# Patient Record
Sex: Male | Born: 1945 | Race: White | Hispanic: No | Marital: Married | Smoking: Former smoker
Health system: Southern US, Community
[De-identification: ages and names within clinical notes are randomized; demographics above are authoritative.]

## PROBLEM LIST (undated history)

## (undated) DIAGNOSIS — IMO0002 Reserved for concepts with insufficient information to code with codable children: Secondary | ICD-10-CM

## (undated) DIAGNOSIS — N529 Male erectile dysfunction, unspecified: Secondary | ICD-10-CM

## (undated) DIAGNOSIS — J189 Pneumonia, unspecified organism: Secondary | ICD-10-CM

## (undated) DIAGNOSIS — B019 Varicella without complication: Secondary | ICD-10-CM

## (undated) DIAGNOSIS — Z0000 Encounter for general adult medical examination without abnormal findings: Secondary | ICD-10-CM

## (undated) DIAGNOSIS — R5383 Other fatigue: Secondary | ICD-10-CM

## (undated) DIAGNOSIS — R030 Elevated blood-pressure reading, without diagnosis of hypertension: Secondary | ICD-10-CM

## (undated) DIAGNOSIS — G4700 Insomnia, unspecified: Secondary | ICD-10-CM

## (undated) DIAGNOSIS — F329 Major depressive disorder, single episode, unspecified: Secondary | ICD-10-CM

## (undated) DIAGNOSIS — E663 Overweight: Secondary | ICD-10-CM

## (undated) DIAGNOSIS — M5137 Other intervertebral disc degeneration, lumbosacral region: Secondary | ICD-10-CM

## (undated) DIAGNOSIS — I10 Essential (primary) hypertension: Secondary | ICD-10-CM

## (undated) DIAGNOSIS — K219 Gastro-esophageal reflux disease without esophagitis: Secondary | ICD-10-CM

## (undated) DIAGNOSIS — J029 Acute pharyngitis, unspecified: Secondary | ICD-10-CM

## (undated) DIAGNOSIS — N400 Enlarged prostate without lower urinary tract symptoms: Secondary | ICD-10-CM

## (undated) DIAGNOSIS — E785 Hyperlipidemia, unspecified: Secondary | ICD-10-CM

## (undated) DIAGNOSIS — M51379 Other intervertebral disc degeneration, lumbosacral region without mention of lumbar back pain or lower extremity pain: Secondary | ICD-10-CM

## (undated) DIAGNOSIS — Z973 Presence of spectacles and contact lenses: Secondary | ICD-10-CM

## (undated) DIAGNOSIS — IMO0001 Reserved for inherently not codable concepts without codable children: Secondary | ICD-10-CM

## (undated) DIAGNOSIS — M545 Low back pain: Secondary | ICD-10-CM

## (undated) DIAGNOSIS — T7840XA Allergy, unspecified, initial encounter: Secondary | ICD-10-CM

## (undated) DIAGNOSIS — R739 Hyperglycemia, unspecified: Secondary | ICD-10-CM

## (undated) DIAGNOSIS — R0600 Dyspnea, unspecified: Secondary | ICD-10-CM

## (undated) DIAGNOSIS — Z86010 Personal history of colonic polyps: Secondary | ICD-10-CM

## (undated) DIAGNOSIS — E349 Endocrine disorder, unspecified: Secondary | ICD-10-CM

## (undated) DIAGNOSIS — F419 Anxiety disorder, unspecified: Secondary | ICD-10-CM

## (undated) HISTORY — DX: Varicella without complication: B01.9

## (undated) HISTORY — DX: Other intervertebral disc degeneration, lumbosacral region: M51.37

## (undated) HISTORY — DX: Encounter for general adult medical examination without abnormal findings: Z00.00

## (undated) HISTORY — DX: Elevated blood-pressure reading, without diagnosis of hypertension: R03.0

## (undated) HISTORY — DX: Hyperglycemia, unspecified: R73.9

## (undated) HISTORY — DX: Anxiety disorder, unspecified: F41.9

## (undated) HISTORY — DX: Personal history of colonic polyps: Z86.010

## (undated) HISTORY — DX: Low back pain: M54.5

## (undated) HISTORY — DX: Reserved for concepts with insufficient information to code with codable children: IMO0002

## (undated) HISTORY — PX: TONSILLECTOMY: SUR1361

## (undated) HISTORY — PX: EYE SURGERY: SHX253

## (undated) HISTORY — DX: Male erectile dysfunction, unspecified: N52.9

## (undated) HISTORY — DX: Major depressive disorder, single episode, unspecified: F32.9

## (undated) HISTORY — DX: Endocrine disorder, unspecified: E34.9

## (undated) HISTORY — PX: BACK SURGERY: SHX140

## (undated) HISTORY — DX: Allergy, unspecified, initial encounter: T78.40XA

## (undated) HISTORY — DX: Benign prostatic hyperplasia without lower urinary tract symptoms: N40.0

## (undated) HISTORY — DX: Other fatigue: R53.83

## (undated) HISTORY — DX: Acute pharyngitis, unspecified: J02.9

## (undated) HISTORY — DX: Insomnia, unspecified: G47.00

## (undated) HISTORY — PX: TONSILLECTOMY: SHX5217

## (undated) HISTORY — DX: Overweight: E66.3

## (undated) HISTORY — DX: Reserved for inherently not codable concepts without codable children: IMO0001

## (undated) HISTORY — DX: Essential (primary) hypertension: I10

## (undated) HISTORY — DX: Other intervertebral disc degeneration, lumbosacral region without mention of lumbar back pain or lower extremity pain: M51.379

## (undated) HISTORY — DX: Gastro-esophageal reflux disease without esophagitis: K21.9

## (undated) HISTORY — DX: Hyperlipidemia, unspecified: E78.5

---

## 1998-06-16 ENCOUNTER — Ambulatory Visit (HOSPITAL_COMMUNITY): Admission: RE | Admit: 1998-06-16 | Discharge: 1998-06-16 | Payer: Self-pay | Admitting: *Deleted

## 1998-06-16 ENCOUNTER — Encounter: Payer: Self-pay | Admitting: *Deleted

## 1998-06-30 ENCOUNTER — Encounter: Payer: Self-pay | Admitting: *Deleted

## 1998-06-30 ENCOUNTER — Ambulatory Visit (HOSPITAL_COMMUNITY): Admission: RE | Admit: 1998-06-30 | Discharge: 1998-06-30 | Payer: Self-pay | Admitting: *Deleted

## 1998-07-22 ENCOUNTER — Encounter: Payer: Self-pay | Admitting: *Deleted

## 1998-07-22 ENCOUNTER — Ambulatory Visit (HOSPITAL_COMMUNITY): Admission: RE | Admit: 1998-07-22 | Discharge: 1998-07-22 | Payer: Self-pay | Admitting: *Deleted

## 1998-08-20 ENCOUNTER — Ambulatory Visit (HOSPITAL_COMMUNITY): Admission: RE | Admit: 1998-08-20 | Discharge: 1998-08-20 | Payer: Self-pay | Admitting: *Deleted

## 1998-08-20 ENCOUNTER — Encounter: Payer: Self-pay | Admitting: *Deleted

## 2000-07-18 LAB — HM COLONOSCOPY

## 2000-07-28 ENCOUNTER — Ambulatory Visit (HOSPITAL_COMMUNITY): Admission: RE | Admit: 2000-07-28 | Discharge: 2000-07-28 | Payer: Self-pay | Admitting: *Deleted

## 2000-07-28 ENCOUNTER — Encounter: Payer: Self-pay | Admitting: *Deleted

## 2000-08-14 ENCOUNTER — Ambulatory Visit (HOSPITAL_COMMUNITY): Admission: RE | Admit: 2000-08-14 | Discharge: 2000-08-14 | Payer: Self-pay | Admitting: *Deleted

## 2000-08-14 ENCOUNTER — Encounter: Payer: Self-pay | Admitting: *Deleted

## 2000-08-25 ENCOUNTER — Encounter: Payer: Self-pay | Admitting: *Deleted

## 2000-08-25 ENCOUNTER — Ambulatory Visit (HOSPITAL_COMMUNITY): Admission: RE | Admit: 2000-08-25 | Discharge: 2000-08-25 | Payer: Self-pay | Admitting: *Deleted

## 2002-09-13 ENCOUNTER — Ambulatory Visit (HOSPITAL_COMMUNITY): Admission: RE | Admit: 2002-09-13 | Discharge: 2002-09-13 | Payer: Self-pay | Admitting: *Deleted

## 2004-08-24 ENCOUNTER — Encounter: Admission: RE | Admit: 2004-08-24 | Discharge: 2004-11-22 | Payer: Self-pay | Admitting: Family Medicine

## 2004-09-27 ENCOUNTER — Encounter: Admission: RE | Admit: 2004-09-27 | Discharge: 2004-09-27 | Payer: Self-pay | Admitting: Otolaryngology

## 2007-01-29 ENCOUNTER — Observation Stay (HOSPITAL_COMMUNITY): Admission: RE | Admit: 2007-01-29 | Discharge: 2007-01-29 | Payer: Self-pay | Admitting: Internal Medicine

## 2007-01-29 ENCOUNTER — Encounter: Payer: Self-pay | Admitting: Internal Medicine

## 2008-07-18 HISTORY — PX: OTHER SURGICAL HISTORY: SHX169

## 2010-11-30 NOTE — H&P (Signed)
NAME:  Luis Brown, Luis Brown NO.:  [id number]   MEDICAL RECORD NO.:  [id number]          PATIENT TYPE:  EMS   LOCATION:  [facility]                         FACILITY:  [facility]   PHYSICIAN:  Barnetta Chapel, MDDATE OF BIRTH:  [birth year]   DATE OF ADMISSION:  01/28/2007  DATE OF DISCHARGE:                              HISTORY & PHYSICAL   PRIMARY CARE PHYSICIAN:  Dr. Nicholes Mango with the [facility]   CHIEF COMPLAINT:  Chest discomfort, dizziness.   HISTORY OF PRESENT ILLNESS:  The patient is a 65 year old male with past  medical history significant for anxiety, chronic back pain and  dyslipidemia.  According to the patient he has never had chest pain or  heart attack in the past.  He also denied family history of heart  attack.  The patient presented today with episodes of chest discomfort.  The history sounds quite atypical.  He reports having episodes of what  looks like adrenalin surge.  He reports chest discomfort, dizziness,  light-headedness and panic symptoms.  He denied any recent stresses.  He  denied any shortness of breath.  He has problems with sleep but denied  symptoms of depression.  He feels that life is worth living at the  moment.  No episodic headache.  However, he has sweats.  He has chronic  back and neck pain.  He has been having nausea and some churning  symptoms associated with these episodes.  No vomiting or change in bowel  habits.  No weight loss.  No joint pains.   PAST MEDICAL HISTORY:  Essentially as above.   MEDICATIONS PRIOR TO ADMISSION:  1. Celebrex.  2. Ultram.  3. Lipitor.  4. Sudafed.   ALLERGIES:  None.   SOCIAL HISTORY:  The patient has been married twice.  He is in his  second marriage.  He has three children.  He drinks alcohol but denies  any withdrawal symptoms.  He denied cigarette smoking or illicit drug  use.   FAMILY HISTORY:  Significant for emphysema in the patient's dad.  The  patient's mom may have had  mini-strokes.   REVIEW OF SYSTEMS:  Essentially as above.   PHYSICAL EXAMINATION:  VITAL SIGNS:  Temperature 97.8, blood pressure  ranges from 149-155 over 74-87 mmHg.  Heart rate is 68, respiratory rate  16.  GENERAL:  The patient denies any distress.  HEENT:  Extraocular muscle movements are intact.  LUNGS:  Clear to auscultation.  CVS:  S1 and S2.  No heart murmur appreciated.  ABDOMEN:  Soft and benign.  NEURO:  Exam is nonfocal.  EXTREMITIES:  No edema.   LABORATORY DATA:  Labs done so far include sodium 138, potassium 3.6,  chloride 107, BUN 29, creatinine 1.1, glucose 100.  White blood cell  count 6.8, hemoglobin 14.9, hematocrit 42.8, MCV 93.9 and platelet count  is 303.   IMPRESSION:  1. Suspect acute coronary syndrome.  2. __________ .  3. Suspect anxiety/panic attacks.  4. Dyslipidemia.   PLAN:  Will admit patient for observation.  Will check cardiac enzymes  as per protocol.  Will get more  history from the patient.  Will consider  cardiac workup in the morning (such as possible cardiac stress test).  Will also check fractionated free plasma metanephrines.  Will also check  fasting lipid profile.  Will check D-dimer.  Will measure orthostasis as  the patient seems to get dizzy any time he stands up.  Will also get a  CT scan of the brain and if necessary will proceed with MRI and/or  carotid Doppler ultrasound.  Further management will depend on hospital  course.      Barnetta Chapel, MD  Electronically Signed     SIO/MEDQ  D:  01/28/2007  T:  01/29/2007  Job:  [id number]   cc:   Dr, Nicholes Mango

## 2010-11-30 NOTE — Discharge Summary (Signed)
NAME:  NATHANUEL, CABREJA              ACCOUNT NO.:  [id number]   MEDICAL RECORD NO.:  [id number]          PATIENT TYPE:  EMS   LOCATION:  [facility]                         FACILITY:  [facility]   PHYSICIAN:  Corinna L. Lendell Caprice, MDDATE OF BIRTH:  March 12, 1946   DATE OF ADMISSION:  01/28/2007  DATE OF DISCHARGE:  01/29/2007                               DISCHARGE SUMMARY   DISCHARGE DIAGNOSES:  1. Transient chest pressure, myocardial infarction ruled out.  Will      follow up with [facility] today at 1:45 pm.  2. Frontal headache, suspect sinusitis.  3. Dizziness, suspect dehydration.  4. Possible panic attack versus vasovagal symptoms.  5. Hyperlipidemia, treated.  6. Chronic back and neck pain.   DISCHARGE MEDICATIONS:  Aspirin 81 mg a day.  He is to stop Sudafed for  now.  He may continue Celebrex, Ultram and Lipitor as previous.  Afrin 2  sprays per nostril twice daily for 3 days.   FOLLOWUP:  1. Dr. Armanda Magic today at 1:45 p.m., to evaluate for the need of      stress test or other.  2. Dr. Doran Clay in 2 weeks, to evaluate his symptoms other than      the chest pain.   CONDITION:  Stable.   CONSULTATIONS:  None.   PROCEDURES:  None.   DISCHARGE ACTIVITIES:  He may return to work in 1-2 days.  He should  avoid being outside in the hot sun for several days.   DIET:  Low cholesterol.  Push fluids.   PERTINENT LABORATORIES:  TSH is pending.  Urine metanephrine is pending.  Two sets of troponin is negative.  CPK-MB is slightly elevated at 5.1.  Total CPK 192.  Index 2.7.  Total cholesterol 168, HDL 69, LDL 81.  Inital BMET was significant for a BUN 29, creatinine 1.1.  After IV  fluids his BUN the following day was 22.  Total bilirubin 1.7, otherwise  unremarkable complete metabolic panel.  Urinalysis with negative  ketones, small blood, negative nitrite, negative leukocyte esterase,  negative protein.  EKG showed normal sinus rhythm with nonspecific  changes.  CT of  the sinuses showed right maxillary sinusitis with  mucosal thickening in the left maxillary and right frontal sinuses.  Prior bilateral medial antrostomies and ethmoidectomies.  Two views of  the chest showed nothing active.   HISTORY AND HOSPITAL COURSE:  Mr. Faulk is a pleasant previously very  healthy 65 year old white male patient of Dr. Janey Greaser, who presented to  the emergency room with a constellation of symptoms.  He reports that he  had been working in the hot sun outside and began to feel real dizzy,  sweaty, nauseated, faint.  He thought he might be having a panic attack,  and started breathing into a paper bag.  He also had some brief twinges  of chest pressure, which would last for a few seconds on and off.  He  reports having had a panic attack 20 years ago, but this was somewhat  different.  He has had no recent life stressors.   He was admitted to  telemetry, where he remained in normal sinus rhythm;  and he ruled out for a myocardial infarction.  He had eaten breakfast  already and his chest pain is rather atypical; so, I have arranged for a  follow-up with Dr. Mayford Knife later today to evaluate for need for stress  test.  I have discussed the case with her.  I have also asked the  patient to bring a copy of his hospital records with him to the  appointment.   He was given IV fluids overnight.  His dizziness improved somewhat.  He  was not dehydrated on examination, but his BUN was rather elevated --  suggesting dehydration.  He also had a frontal headache, which felt  somewhat like previous sinusitis in the past.  He has been taking  Sudafed.  I suspect his symptoms may have been a combination of  dehydration, Sudafed, possibly panic attack, and/or vasovagal symptoms.  But, I would recommend an outpatient stress test if Dr. Mayford Knife agrees to  evaluate for heart disease.  He does report that his exercise tolerance  is quite good, and he does not experience chest pressure  with exertion.  His only risk factor is hyperlipidemia, but this is treated with  Lipitor.  Also, urine metanephrines were ordered by the admitting  physician, and I have also asked that a TSH be drawn to evaluate for  hyperthyroidism.      Corinna L. Lendell Caprice, MD  Electronically Signed     CLS/MEDQ  D:  01/29/2007  T:  01/29/2007  Job:  [id number]   cc:   Armanda Magic, M.D.  Fax: [phone]   Al Decant. Janey Greaser, MD  Fax: [phone]

## 2011-04-08 ENCOUNTER — Other Ambulatory Visit (HOSPITAL_COMMUNITY): Payer: Self-pay | Admitting: Specialist

## 2011-04-08 ENCOUNTER — Encounter (HOSPITAL_COMMUNITY)
Admission: RE | Admit: 2011-04-08 | Discharge: 2011-04-08 | Disposition: A | Discharge status: Home / Self Care | Payer: Managed Care, Other (non HMO) | Source: Ambulatory Visit | Attending: Specialist | Admitting: Specialist

## 2011-04-08 DIAGNOSIS — M419 Scoliosis, unspecified: Secondary | ICD-10-CM

## 2011-04-08 DIAGNOSIS — M48061 Spinal stenosis, lumbar region without neurogenic claudication: Secondary | ICD-10-CM

## 2011-04-08 LAB — COMPREHENSIVE METABOLIC PANEL
ALT: 14 U/L (ref 0–53)
AST: 28 U/L (ref 0–37)
Albumin: 3.9 g/dL (ref 3.5–5.2)
Alkaline Phosphatase: 85 U/L (ref 39–117)
BUN: 25 mg/dL — ABNORMAL HIGH (ref 6–23)
CO2: 32 mEq/L (ref 19–32)
Calcium: 9.8 mg/dL (ref 8.4–10.5)
Chloride: 101 mEq/L (ref 96–112)
Creatinine, Ser: 1.07 mg/dL (ref 0.50–1.35)
GFR calc Af Amer: >60 mL/min (ref >60)
GFR calc non Af Amer: >60 mL/min (ref >60)
Glucose, Bld: 108 mg/dL — ABNORMAL HIGH (ref 70–99)
Potassium: 4.6 mEq/L (ref 3.5–5.1)
Sodium: 139 mEq/L (ref 135–145)
Total Bilirubin: 0.8 mg/dL (ref 0.3–1.2)
Total Protein: 6.4 g/dL (ref 6.0–8.3)

## 2011-04-08 LAB — URINE MICROSCOPIC-ADD ON

## 2011-04-08 LAB — URINALYSIS, ROUTINE W REFLEX MICROSCOPIC
Bilirubin Urine: NEGATIVE
Glucose, UA: NEGATIVE mg/dL
Ketones, ur: NEGATIVE mg/dL
Leukocytes, UA: NEGATIVE
Nitrite: NEGATIVE
Protein, ur: NEGATIVE mg/dL
Specific Gravity, Urine: 1.015 (ref 1.005–1.030)
Urobilinogen, UA: 0.2 mg/dL (ref 0.0–1.0)
pH: 7 (ref 5.0–8.0)

## 2011-04-08 LAB — CBC
HCT: 41.8 % (ref 39.0–52.0)
Hemoglobin: 14.7 g/dL (ref 13.0–17.0)
MCH: 33.9 pg (ref 26.0–34.0)
MCHC: 35.2 g/dL (ref 30.0–36.0)
MCV: 96.3 fL (ref 78.0–100.0)
Platelets: 286 10*3/uL (ref 150–400)
RBC: 4.34 MIL/uL (ref 4.22–5.81)
RDW: 12.1 % (ref 11.5–15.5)
WBC: 8 10*3/uL (ref 4.0–10.5)

## 2011-04-08 LAB — TYPE AND SCREEN
ABO/RH(D): A POS
Antibody Screen: NEGATIVE

## 2011-04-08 LAB — PROTIME-INR
INR: 0.91 (ref 0.00–1.49)
Prothrombin Time: 12.4 seconds (ref 11.6–15.2)

## 2011-04-08 LAB — SURGICAL PCR SCREEN
MRSA, PCR: NEGATIVE
Staphylococcus aureus: POSITIVE — AB

## 2011-04-08 LAB — ABO/RH: ABO/RH(D): A POS

## 2011-04-12 ENCOUNTER — Inpatient Hospital Stay (HOSPITAL_COMMUNITY)
Admission: RE | Admit: 2011-04-12 | Discharge: 2011-04-14 | DRG: 460 | Disposition: A | Discharge status: Home / Self Care | Payer: Managed Care, Other (non HMO) | Source: Ambulatory Visit | Attending: Specialist | Admitting: Specialist

## 2011-04-12 ENCOUNTER — Inpatient Hospital Stay (HOSPITAL_COMMUNITY): Payer: Managed Care, Other (non HMO)

## 2011-04-12 DIAGNOSIS — F1010 Alcohol abuse, uncomplicated: Secondary | ICD-10-CM | POA: Diagnosis present

## 2011-04-12 DIAGNOSIS — F411 Generalized anxiety disorder: Secondary | ICD-10-CM | POA: Diagnosis present

## 2011-04-12 DIAGNOSIS — M48061 Spinal stenosis, lumbar region without neurogenic claudication: Principal | ICD-10-CM | POA: Diagnosis present

## 2011-04-12 DIAGNOSIS — Z01812 Encounter for preprocedural laboratory examination: Secondary | ICD-10-CM

## 2011-04-12 DIAGNOSIS — Z87891 Personal history of nicotine dependence: Secondary | ICD-10-CM

## 2011-04-12 DIAGNOSIS — M4120 Other idiopathic scoliosis, site unspecified: Secondary | ICD-10-CM | POA: Diagnosis present

## 2011-04-12 DIAGNOSIS — D62 Acute posthemorrhagic anemia: Secondary | ICD-10-CM | POA: Diagnosis not present

## 2011-04-13 LAB — BASIC METABOLIC PANEL
BUN: 16 mg/dL (ref 6–23)
CO2: 29 mEq/L (ref 19–32)
Calcium: 7.6 mg/dL — ABNORMAL LOW (ref 8.4–10.5)
Chloride: 106 mEq/L (ref 96–112)
Creatinine, Ser: 0.82 mg/dL (ref 0.50–1.35)
GFR calc Af Amer: >60 mL/min (ref >60)
GFR calc non Af Amer: >60 mL/min (ref >60)
Glucose, Bld: 118 mg/dL — ABNORMAL HIGH (ref 70–99)
Potassium: 3.8 mEq/L (ref 3.5–5.1)
Sodium: 139 mEq/L (ref 135–145)

## 2011-04-13 LAB — HEMOGLOBIN AND HEMATOCRIT, BLOOD
HCT: 28.1 % — ABNORMAL LOW (ref 39.0–52.0)
Hemoglobin: 9.4 g/dL — ABNORMAL LOW (ref 13.0–17.0)

## 2011-04-14 LAB — BASIC METABOLIC PANEL
BUN: 13 mg/dL (ref 6–23)
CO2: 29 mEq/L (ref 19–32)
Calcium: 8.2 mg/dL — ABNORMAL LOW (ref 8.4–10.5)
Chloride: 108 mEq/L (ref 96–112)
Creatinine, Ser: 0.75 mg/dL (ref 0.50–1.35)
GFR calc Af Amer: >60 mL/min (ref >60)
GFR calc non Af Amer: >60 mL/min (ref >60)
Glucose, Bld: 151 mg/dL — ABNORMAL HIGH (ref 70–99)
Potassium: 3.9 mEq/L (ref 3.5–5.1)
Sodium: 140 mEq/L (ref 135–145)

## 2011-04-14 LAB — HEMOGLOBIN AND HEMATOCRIT, BLOOD
HCT: 26.3 % — ABNORMAL LOW (ref 39.0–52.0)
Hemoglobin: 9.1 g/dL — ABNORMAL LOW (ref 13.0–17.0)

## 2011-04-19 NOTE — Op Note (Signed)
NAME:  Luis Brown, Luis Brown NO.:  [id number]  MEDICAL RECORD NO.:  [id number]  LOCATION:  5030                         FACILITY:  [facility]  PHYSICIAN:  Kerrin Champagne, M.D.   DATE OF BIRTH:  1945-08-31  DATE OF PROCEDURE:  04/12/2011 DATE OF DISCHARGE:  04/14/2011                              OPERATIVE REPORT   PREOPERATIVE DIAGNOSES: 1. Multilevel lumbar spinal stenosis, severe, central, and biforaminal     L2-L3, L3-L4, and L4-L5. 2. 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 3. Severe right L3-L4 nerve root distribution radicular pain.  POSTOPERATIVE DIAGNOSES: 1. Multilevel lumbar spinal stenosis, severe, central, and biforaminal     L2-L3, L3-L4, and L4-L5. 2. 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 3. Severe right L3-L4 nerve root distribution radicular pain.  PROCEDURES:  Central decompressive laminectomy L2-L3, L3-L4, and L4-L5, right-sided L2-L3 and L3-L4 transforaminal lumbar interbody fusions using a 9-mm cage DePuy parallel at the L2-L3 level with local bone graft.  An 8-mm lordotic DePuy cage at the L3-L4 level with local bone graft.  Posterolateral fusion L2 through L5 three levels with local bone graft.  L2 through L5 segmental instrumentation with DePuy pedicle screws and rods, single cross-link at the L3-L4 level.  Cell Saver was used during this case.  SURGEON:  Kerrin Champagne, MD  ASSISTANT:  Wende Neighbors, PA-C  ANESTHESIA:  Quita Skye. Krista Blue, MD, supplemented with local infiltration with Marcaine 0.5% with 1:200,000 epinephrine, 20 mL at the incision site.  ESTIMATED BLOOD LOSS:  1400 mL.  Cell Saver return blood hyper- hematocrit 575 mL.  DRAINS:  Hemovac medium x1, right lower lumbar.  Foley to straight drain.  COMPLICATIONS:  None.  BRIEF CLINICAL HISTORY:  This patient is a 65 year old male who has had a history of previous lumbar decompression and diskectomy in the 1990s. He has been followed  for sometime with persistent severe back pain and radiation to his buttocks which has been worsening with increasing difficulties with prolonged standing, prolonged ambulation.  He has undergone studies that have shown significant stenosis at both L2-L3, L3- L4 and a lateral recess narrowing at L4-L5 with a collapsing degenerative scoliosis pattern.  He has been treated conservatively with therapy with epidural steroids over the last 3-4 years.  He has reached a point where epidural steroids no longer of much benefit.  His pain is severe, requiring narcotic medications and he is avoiding activities secondary to his pain.  He has difficulty walking any distance.  He has difficulty with normal activities of daily living, difficulty standing from a sitting position.  The patient has been unable to perform his usual work duties secondary to increasing back discomfort and leg pain. He underwent recent MRI scan which demonstrated severe lumbar spinal stenosis L2-L3, L3-L4, moderate stenosis and lateral recess stenosis L4- L5.  He has a collapsing degenerative scoliosis pattern with apex to the left side at the L2-L3 and L3-L4 levels.  There is loss of an anterior column height and relative kyphosis or hyperlordosis across this mid upper lumbar region.  He has undergone extensive attempts at conservative management and has failed these and presents for surgical treatment.  The risks and benefits  of procedure have been discussed with the patient and he has signed informed consent.  DESCRIPTION OF PROCEDURE:  This patient is seen in the preoperative holding area describing primarily right leg pain.  He had marking of his back on the right side with an X on my initials, labeling the levels for which surgery would be done at L2-L3, L3-L4, L4-L5 on the right side. The patient understands that likely we will perform the TLIF at the upper 2 segments L2-L3, L3-L4 and the lowest level which has severe  disk space narrowing will likely be treated with posterolateral fusion alone. He has signed informed consent.  We have answered all questions preoperatively in preop holding area.  He received standard preoperative antibiotics of Ancef, transported to the operating room [facility] via stretcher.  OR room #4 was used for this procedure.  The patient underwent induction of general anesthesia on the stretcher with atraumatic intubation.  He was then transferred to a prone position on the [place] spine frame.  Bolsters leveled appropriately, arms at the side 90/90, the legs well padded with PAS hose to prevent DVT.  A Foley catheter was placed prior to this patient being turned atraumatically as well.  The patient had had previous incision scar.  The lumbar spine was prepped from the mid-to-lower dorsal spine to the mid sacral level with DuraPrep solution.  He was then draped in the usual manner, iodine Vi- Drape was used.  Incision extended from T12 to S1, the ellipse of the old incision scar at the L4-L5, L5-S1 levels.  The incision was through skin and subcu layers directly down to the lumbodorsal fascia in the midline excising the old incision scar over the lower half of the incision.  Lumbodorsal fascia was then incised in the midline over the spinous process of S1, L5, L4, L3, L2, and L1.  Carefully, dissection was carried laterally exposing the lateral aspects of the spinous process of each level.  Cobbs were used to elevate paralumbar muscles and continued dissection and carried laterally.  With the facet capsule preserved at this point, then clamps were placed.  A clamp placed at the level below the L3 spinous process and above the L3 spinous process. These areas were marked for continued identification.  The L2, L3, and L4 levels were then carefully marked for continued identification throughout the case, marking these levels.  Note that C-arm fluoro was draped sterilely,  brought into the field for the lateral radiograph lumbar spine.  Also note that standard time-out protocol was carried out prior to the incision on this patient.  Once the levels were marked, then continued dissection was carried out laterally and the facet capsules were resected at the L4-L5, L3-L4, and L2-L3 levels bilaterally and a transverse process of L5, L4, L3, and L2 were identified. Bleeders controlled using monopolar electrocautery and bipolar electrocautery where necessary.  Cell Saver was used during this case. The patient did have some brisk venous-type bleeding during the dissection and Cell Saver was necessary during this case.  Viper retractor was then placed and the areas over the transverse processes were carefully packed using sponges bilaterally.  Attention was then turned to perform a central laminectomy.  The inferior half of the spinous process of L1; entire spinous process of L2, L3, and L4; superior third of the spinous process of L5 were resected.  This bone was carefully morselized and preserved for later bone grafting purposes. Central portions of the lamina of L2, L3, and L4  were then carefully thinned using Leksell rongeur where possible to save this bone for later bone grafting as well.  Osteotomes were then used to perform osteotomies of the inferior articular process of L4 bilaterally, L3 bilaterally, and L2 bilaterally.  Leksell was also used to remove a small portion of bone over the inferior aspect of the lamina of L1 bilaterally.  At the center, we performed a resection of ligamentum flavum at the L1-L2 level to prevent adjacent level stenosis.  Osteotome was then used to remove bone centrally in order to thin the neural arch.  This was done carefully leaving the ligamentum flavum intact where possible and returning to use of Kerrisons, further resection of the remaining lamina at L4, L3, and L2 were carried out until this was a thin wafer bone  to allow for resection using Kerrisons.  Beginning at the L1-L2 level, then the ligamentum flavum at this segment was resected and foraminotomies performed over both L2 nerve roots.  Lateral recess was decompressed with a small portion of the inferior articular process of L1 and superior articular process of L2 being resected about 10% both sides. Then performing central laminectomy at the L2 level, bone was resected centrally, osteotomized laterally over the pars area of both sides in order to open the central lamina laminectomy.  Ligamentum flavum at the L2-L3 level was then resected using 2 and 3-mm Kerrisons off the medial aspect of the facet at L2-L3 bilaterally and then foraminotomies performed over both L3 nerve roots identifying then the medial border of the pedicles bilaterally at L3 and then osteotomized the medial aspect of the superior articular process of L3 both sides and removing bone here that way decompressing the L2-L3 level without causing significant compression.  This was carried out bilaterally.  The right superior portion of the L3 superior articular process was resected to the superior portion of the pedicle of L3 in order to perform a wide foraminotomy of the right L2 nerve root and this was done without difficulty.  Reflected portions of flavum reflected were resected off the L2 nerve root underlying.  This bleeder was controlled using bipolar electrocautery.  Hockey stick probe could then be passed along the lateral recess at L1-L2 and out the neural foramen on the right-hand side at L2-L3, indicating that the L2 nerve root was well decompressed. Similarly, this was done at the L3-L4 level resecting residual portions of the remaining lamina at the upper aspect of L3 using 2 and 3-mm Kerrison, osteotomizing medial portions of the pars and the inferior articular process bilaterally at the L3-L4 level.  Resecting ligamentum flavum off the medial aspect of the facet  bilaterally at the L3-L4 level and then performing foraminotomy over both L4 nerve roots, identifying the medial aspect of pedicle at the L4 level, then osteotomizing superior articular process of L4 both sides and decompressing the lateral recesses.  The superior portion of the right-side L4 superior articular process was resected in order to decompress the right L3 nerve root at the L3-L4 level.  This was done without difficulty.  The ligamentum flavum reflected was then resected off the L3 nerve root. Nerve root noted to be exiting down without further compression. Similarly then, the L4 lamina was resected and the L4-L5 ligamentum flavum resected.  Medial facetectomy carried out bilaterally after foraminotomy over the L5 nerve roots, widening the central laminectomy in the area of the pars area as well with resection of bone.  A foraminotomy was carried out over both L4  nerve roots and L5 nerve roots, resecting hypertrophic ligamentum flavum that was depressing on the L4 and L5 nerve roots and well decompressing the central portions of the canal at this level.  With this completed, bleeding was controlled using bipolar electrocautery and then thrombin-soaked Gelfoam on the right-sided L2-L3 and at L3-L4 especially within the neural foramen where bleeding a venous type was encountered, obtaining excellent hemostasis.  C-arm fluoro was reestablish and an awl then used to make an entry point in the intersection of the transverse process of L2 with the lateral aspect of the pedicle of L2 observed on C-arm fluoro to be in good position and alignment.  Handheld pedicle probe then used to probe the pedicle of L2 to a depth of nearly 45 mm.  A ball-tipped probe was then used to probe this channel, ensuring patency and no sign of broaching a cortex.  Tapping was performed using a 6-mm tap, decortication carried out at the lateral aspect of superior articular process of L2 and the transverse  process here.  Bone graft that was obtained locally morselized was placed over this decorticated transverse process and the 7-mm x 45-mm screw was then inserted with correct degree of convergence lordosis observed on C-arm fluoro to be in excellent position and alignment.  Turning next to the right side L3 level, similarly an awl was introduced into the lateral aspect of pedicle of L3 observed on C-arm fluoro to be in good position and alignment.  Then, a handheld pedicle probe used to probe pedicle channel at L3 to a depth of 45 mm and a 45-mm screw was chosen x 7 mm.  Tapping with a 6-mm tap. Decortication was then carried out of the transverse process of L3 and along the lateral aspect of the pedicle of L3 in the residual superior articular process area.  Checking with a ball-tipped probe ensuring the patency of the opening in the pedicle, no sign of broaching.  Then, the pedicle screw was then introduced into the pedicle on the correct degree of convergence and lordosis, observed on C-arm fluoro to be in good position and alignment.  After decortication of the transverse process, note that local bone graft was applied extending from L2 to the L3 transverse process.  This was done prior to placement of the screw. Then turning to the right L4 level, an awl again used to place a small opening in the lateral aspect of the pedicle in the intersection of the transverse process of L4 and its pedicle laterally.  Handheld pedicle probe then used to probe pedicle channel, again preserving correct degree of convergence, lordosis on x-ray.  Once this was obtained, it could only be placed about 40 mm, so the 40-mm x 7-mm screw was chosen after tapping with a 6-mm tap, again checked with a ball-tipped probe to ensure no broaching cortex.  Then, decortication of the transverse process in the lateral aspect of the pedicle and superior articular process of L4 was carried out.  Screw was then placed  without difficulty and the correct degree of convergence and lordosis observed on C-arm fluoro.  Then performing the right L5 screw placement, the transverse process of L5 was decorticated and an awl used to make an entry point into the pedicle on the lateral aspect of the pedicle of the intersection with transverse process observed in C-arm fluoro to be in good position and alignment.  A pedicle probe was then used to probe pedicle channel of L5 to a depth of 45  mm and a 7-mm x 45-mm screw was chosen.  Checking with a ball-tipped probe, probing the pedicle opening and for patency and no broaching cortex noted with the ball-tipped probe.  Decortication of the transverse process and lateral aspect of the pedicle carried out as well as superior articular process of L5. Bone graft was then packed from the transverse process of L5 to that of L4 and the screw placed at the L5 level without difficulty with correct degree of convergence and lordosis observed on C-arm fluoro.  Attention then turned to performing transforaminal lumbar interbody fusion, first at the right L2-L3 level.  The L2 nerve root carefully identified, retracted superiorly, and the lateral aspect of thecal sac at the L2-L3 disk space was retracted medially carefully.  A 15 blade scalpel used to enter the disk over the superior aspect of the pedicle of L3 and a window disk material was resected using a 15 blade scalpel and pituitary rongeurs.  Dilation of the disk space was then carried out using 7, 8, and 9-mm dilator, 9 provided the best fit.  With this then, the disk space was carefully distracted using a laminar spreader between the pedicle screws on the right side at L2 and L3.  The disk space after dilation and observance of the 9 dilator being the correct degree of dilatation for filling of the intervertebral disk space, pituitary rongeurs were used to debride the disk space, degenerative disk material.  Endplates were  then curetted using straight upbiting right, upbiting left curettes debriding cartilaginous endplates and any residual degenerative disk material.  Pituitary rongeurs, upbiting straight used to debride the disk space adequately.  Then, ring curettes were used to further debride the endplates down to bleeding endplate bone both over the inferior aspect of L2, superior aspect of L3.  With this then, trial cages were placed, 9 mm provided excellent fit. Attention then turned to the bone graft of intervertebral disk space using a bayonet forceps, then morselized local bone graft was entered into the disk space and packed it into place using a 7-mm trial cage. This was done 3 times in order to fully pack the disk space with local bone graft.  A parallel cage DePuy Concorde 9 mm was carefully packed with morselized bone graft had been obtained.  This was then placed in the correct degree of convergence and packed it into place subset beneath the posterior aspect of the posterior disk space by about 3-4 mm.  This kicked slightly to a more coronal position.  Observed on C-arm fluoro to be in good position and alignment.  Examining the spinal canal, no bone graft prominence noted within the canal or within the foramen.  The L2 nerve root noted to be exiting without compression and good restoration of the L2-L3 disk space height within the foramen on the right side noted.  The laminar spreader then placed between L4 and L2 and distraction obtained here.  A D'errico was used to carefully retract the thecal sac at the L2-L3 level and the L3 nerve root was carefully retracted superiorly.  Bleeders were controlled using bipolar electrocautery and small veins were cauterized with the axillary portion of the L3 nerve root region in order to mobilize the nerve and thecal sac and exposed the disk space here.  A 15 blade scalpel used to incise the disk and pituitary rongeurs used to debride the disk of  degenerative disk material that was present.  Dilation of the disk space was carried out first  with a 7-mm, then an 8 mm and 8 mm provided the best fit and fully filled canal space that was severely degenerated.  No attempt made to place a 9 as the 8 was quite tight.  The disk space and then carefully debrided of degenerative disk material using curettes both straight, upbiting right, upbiting left curettes and pituitary rongeurs and then the ring curettes were introduced into disk space through the opening on the right side and used to debride further the endplates of any cartilaginous endplate material, degenerative disk material. Pituitary rongeur was used again to debride the disk space of any residual eminence of this debrided disk and endplate material down to bleeding bone surfaces.  Trials were then inserted for a 7, then an 8-mm trial, observed on C-arm fluoro to be good position and alignment and adequately filled the disk space.  Trial was then removed and the disk space was then packed with morselized local bone graft and impacted using a 7-mm impactor at least 3 times in order to fully fill the disk space with graft material.  The 8-mm lordotic Concorde cage was then carefully packed, then inserted using the insertion handle and correct degree of convergence and lordosis ensuring that the missing tines were posterior with the placement.  This was then impacted into place without difficulty.  Observed on C-arm fluoro to be in good position and alignment with subset about 3 mm anterior to the posterior margin of the L3-L4 disk space.  This completed TLIF on the right side and instrumentation of the right side.  A 95-mm pre-contoured rod was then carefully placed within the fasteners that were carefully loosened with fastener loosener and noted to be just minimally short and it was felt that with compression, this would fit nearly exactly the fasteners right side so that a  slight amount of contour was built into the superior portion of the curve in order for the rod to fit into the superior most fastener and then this was carefully placed and the each of the fastener caps were then placed.  The most superior fastener cap was then tightened to 80 foot-pounds to keep it in place while continued placement of the left side construct was performed.  Irrigation was carried out during the case.  Note that an additional gram of Ancef was given at 5 hours into the case to continue prophylactic antibiotics coverage.  Changing sides on the left side, then note a loop medication headlamp was used throughout the case.  On the left side then beginning at the L2 level again, an awl was introduced to the intersection of the transverse process of L2, the lateral aspect of pedicle of L2 observed on C-arm fluoro to be in good position and alignment.  Pedicle probe then used to probe pedicle channel, the central portion of the pedicle to a depth of 45 mm.  A 7-mm x 45-mm screw was used and then tapping with a 6-mm tap, a ball-tipped probe was used to carefully probe the channel ensuring patency and no sign of broaching cortex.  After decortication of the transverse process and lateral aspect of pedicle, superior articular process of L2 bone graft was obtained locally was then carefully placed over the decorticated bone and the 7-mm x 45-mm screw was placed.  Note that pedicle channel was reprobed after tapping to ensure patency and no sign of broaching cortex here.  Turning to the L3 level, an awl again used to make an entry point over the  lateral aspect of the pedicle of L3 and its intersection, transverse process of L3.  Pedicle probe was then used to probe this channel after C-arm fluoro demonstrated the initial awl in good position and alignment. After probing this pedicle then to ensure its patency and no sign of broaching cortex, tapping was carried out with a 6-mm  tap. Decortication of the transverse process of L3 and the lateral aspect of the pedicle of L3 and superior articular process laterally was carried out using a high-speed bur 3 mm and then a 45-mm screw x 7-mm was introduced after placing bone graft from transverse process of L3 to that of L2.  Screw was placed without difficulty.  A third screw on the left side was at the L4 level.  Again, an awl used to make entry point of the lateral aspect of the pedicle at its intersection of the transverse process of L4 observed on C-arm fluoro to be in good position and alignment.  Then, handheld pedicle probe then used to probe pedicle channel into the pedicle of L4.  Ball-tipped probe used to ensure patency and no sign of broaching cortex.  This probed to a depth of 45 mm on the left side.  Tapping was carried out with a 6-mm tap and then a 7-mm x 45-mm screw was placed on the left side at L4 and the correct degree of convergence and lordosis observed on C-arm fluoro to be in good position and alignment.  Note that decortication of the transverse process of L4 and lateral aspect of the superior articular process of L4 was carried out.  Local bone graft placed prior to placement of the screw.  The awl was then used to the last area on the left side at L5 along the area of the intersection of the pedicle of L5 with transverse process of L5.  Initial entry point was made and observed on C-arm fluoro to be in good position and alignment.  All removed and the pedicle probe then used to probe pedicle of L5 to a depth of 45 mm and the correct degree of convergence and lordosis observed on C-arm fluoro. Ball-tipped probe then used probe pedicle opening channel ensuring its patency and no sign of broaching cortex.  Tapping was then carried out with a 6-mm tap and then a 7-mm x 45-mm screw placed on the left side at L5 after first decorticating transverse process and lateral aspect superior articular  process of L5 and placing the bone graft.  Screw was placed without difficulty.  Each of the fasteners on the left side were then carefully loosened using the fastener loosener and then 95-mm rod was placed within each of the fasteners, tightened down at the most superior extent.  Note that with this, it was noted that the rod was not extending beyond the lower fastener.  It was, however, felt that with compression across the disk spaces, this would eventually show some extension beyond the L4 to be used.  Beginning on the right side then, compression was obtained between the fastener of L2 and of L3 and the fastener cap at L3 was then tightened to 80 foot-pounds and an L2 had already been tightened to 80 foot-pounds.  Then going to the left side, the rod was carefully aligned.  The fastener at L2 was attached to the rod and the cap tightened to 80 foot-pounds.  Compression obtained between the L2 and L3 fasteners on the left side using the compressor provided and then the  cap at L2 was carefully tightened to 80 foot- pounds.  Returning to the right side then, compression obtained between the L3 and L4 fasteners and the fastener cap at the L4 level was then tightened to 80 foot-pounds, compressing this level nicely to restore lordosis and to narrow the posterior aspect of the disk space.  Turning to the left side, compression obtained between the left L3 and L4 fasteners and the fastener at L4 was then tightened to 80 foot-pounds. Returning to the right side at L4-L5 then, some amount of distraction was obtained between the fastener of L4 and L5 in order to open the neural foramen on the right at L4-L5 and ensuring then at the end of the rod was beyond the fastener on the right side, then the cap at the L5 level was tightened to 80 foot-pounds.  This effectively opened the neural foramen on the right side at L4-L5 to allow for the nerve root to exit without compression and to restore some  improvement in overall alignment secondary to collapse, asymmetric disk space into the right side greater than left.  Turning to the left side then, compression was obtained between the L4 and L5 fasteners as this was on the convex sideof the curve at the lower end of the fusion site and then the fastener cap attached at the L5 level and tightened into place.  Note, this did require several readjustments to ensure that the rod was equal spaced and extending beyond the fastener at both the L2 and L5 levels on the left, but this was able to be done quite nicely.  This completed the pedicle screw, rod placement, and the posterolateral effusion technique, so the transverse loading rod width was determined using the appropriate measuring device measured 86.  Transverse loading rod was then carefully placed between the fastener at the L3 and L4 level bilaterally and then these were carefully tightened using the torque device, fastening the hooks to the rod between the fasteners of L3 and L4 bilaterally.  Then, the adjustment screw at the central portion of the connector was then carefully tightened to 60 foot-pounds as directed.  This completed the construct.  Intraoperative C-arm fluoro images in AP and lateral planes were obtained documenting the instrumentation and restoration of alignment and overall position and alignment of the hardware which appeared to be quite adequate.  With this then, irrigation was carried out.  Careful hemostasis was obtained.  All thrombin-soaked Gelfoam had been resected and removed from the neural foramen and the lateral gutters of the lumbar spine.  Bleeders controlled using bipolar electrocautery.  The remaining bone graft was then placed posterolaterally on the left side and right side and particularly at the L4-L5 level as this was primarily posterolateral fusion.  Decortication of the facet joints were carried out on the left side at L2-L3, L3-L4, and L4-L5;  right side at L4-5.  With this then, the surgery was completed, so that a medium Hemovac drain was placed in the depth of incision exiting over the right lower lumbar spine.  Paralumbar muscles were carefully approximated in the midline with interrupted #1 Vicryl sutures loosely.  Lumbodorsal fascia was tagged down to the supraspinous ligament and the interspinous ligaments at T12 and L1 superiorly and at L5 and S1 inferiorly.  Lumbodorsal fascia was then reapproximated in the midline with interrupted simple and figure-of-eight sutures of #1 Vicryl.  Deep subcu layers were reapproximated with interrupted 0 and 2- 0 Vicryl sutures for the more superficial levels.  Skin then closed with a running subcu stitch of 4-0 Vicryl.  Dermabond was then applied. Mepilex bandage was then applied.  The patient was then returned to a supine position, reactivated, extubated, and returned to the recovery room in satisfactory condition.  Note that, Cell Saver blood was returned at the end of the case of 575 mL.  PHYSICIAN ASSISTANT'S RESPONSIBILITIES:  Maud Deed performed the duties of assistant surgeon throughout the case.  She is a Advice worker.  She was present from the beginning of the case to the end of the case.  She assisted in careful positioning of the patient, careful positioning of arms.  She assisted in the initial procedure, marking of skin.  She assisted in careful suctioning in and about and performed some retraction of neural elements during decompression and during TLIF performance in this procedure.  She assisted then in the instrumentation and performance of compressing and tightening of caps within the hardware that was placed in the placement of the transverse loading rod. At the end of the case, she performed closure from the fascia to the skin and the application of dressings and assisted in the patient's transfer from OR table to his stretcher.     Kerrin Champagne,  M.D.     JEN/MEDQ  D:  04/14/2011  T:  04/15/2011  Job:  [id number]  Electronically Signed by Vira Browns M.D. on 04/19/2011 30:86:57 PM

## 2011-05-03 LAB — DIFFERENTIAL
Basophils Absolute: 0
Basophils Relative: 0
Eosinophils Absolute: 0.2
Eosinophils Relative: 3
Lymphocytes Relative: 20
Lymphs Abs: 1.4
Monocytes Absolute: 0.9 — ABNORMAL HIGH
Monocytes Relative: 13 — ABNORMAL HIGH
Neutro Abs: 4.4
Neutrophils Relative %: 64

## 2011-05-03 LAB — CBC
HCT: 42.8
Hemoglobin: 14.9
MCHC: 34.9
MCV: 93.9
Platelets: 303
RBC: 4.56
RDW: 12.4
WBC: 6.8

## 2011-05-03 LAB — METANEPHRINES, PLASMA
Metanephrine, Free: <0.2 (ref <0.50)
Normetanephrine, Free: 0.47 (ref <0.90)

## 2011-05-03 LAB — POCT CARDIAC MARKERS
CKMB, poc: 3.9
Myoglobin, poc: 81.4
Operator id: 270111
Troponin i, poc: <0.05

## 2011-05-03 LAB — POCT I-STAT CREATININE
Creatinine, Ser: 1.1
Operator id: 270111

## 2011-05-03 LAB — COMPREHENSIVE METABOLIC PANEL
ALT: 12
AST: 22
Albumin: 3.8
Alkaline Phosphatase: 59
BUN: 22
CO2: 27
Calcium: 8.8
Chloride: 107
Creatinine, Ser: 0.95
GFR calc Af Amer: >60
GFR calc non Af Amer: >60
Glucose, Bld: 108 — ABNORMAL HIGH
Potassium: 4
Sodium: 140
Total Bilirubin: 1.7 — ABNORMAL HIGH
Total Protein: 5.8 — ABNORMAL LOW

## 2011-05-03 LAB — URINALYSIS, ROUTINE W REFLEX MICROSCOPIC
Bilirubin Urine: NEGATIVE
Glucose, UA: NEGATIVE
Ketones, ur: NEGATIVE
Leukocytes, UA: NEGATIVE
Nitrite: NEGATIVE
Protein, ur: NEGATIVE
Specific Gravity, Urine: 1.019
Urobilinogen, UA: 0.2
pH: 6.5

## 2011-05-03 LAB — LIPID PANEL
Cholesterol: 168
HDL: 69
LDL Cholesterol: 81
Total CHOL/HDL Ratio: 2.4
Triglycerides: 89
VLDL: 18

## 2011-05-03 LAB — I-STAT 8, (EC8 V) (CONVERTED LAB)
BUN: 29 — ABNORMAL HIGH
Bicarbonate: 23.4
Chloride: 107
Glucose, Bld: 100 — ABNORMAL HIGH
HCT: 45
Hemoglobin: 15.3
Operator id: 270111
Potassium: 3.6
Sodium: 138
TCO2: 24
pCO2, Ven: 35.4 — ABNORMAL LOW
pH, Ven: 7.428 — ABNORMAL HIGH

## 2011-05-03 LAB — CK TOTAL AND CKMB (NOT AT ARMC)
CK, MB: 5.1 — ABNORMAL HIGH
Relative Index: 2.7 — ABNORMAL HIGH
Total CK: 192

## 2011-05-03 LAB — URINE MICROSCOPIC-ADD ON

## 2011-05-03 LAB — D-DIMER, QUANTITATIVE: D-Dimer, Quant: <0.22

## 2011-05-03 LAB — TROPONIN I: Troponin I: 0.02

## 2011-05-03 LAB — PHOSPHORUS: Phosphorus: 2.9

## 2011-05-03 LAB — TSH: TSH: 2.105

## 2011-05-03 LAB — CARDIAC PANEL(CRET KIN+CKTOT+MB+TROPI)
CK, MB: 4.6 — ABNORMAL HIGH
Relative Index: 2.8 — ABNORMAL HIGH
Total CK: 163
Troponin I: 0.03

## 2011-05-03 LAB — MAGNESIUM: Magnesium: 2.3

## 2011-07-13 ENCOUNTER — Other Ambulatory Visit: Payer: Self-pay | Admitting: Specialist

## 2011-07-13 DIAGNOSIS — M545 Low back pain, unspecified: Secondary | ICD-10-CM

## 2011-10-19 ENCOUNTER — Ambulatory Visit (INDEPENDENT_AMBULATORY_CARE_PROVIDER_SITE_OTHER): Payer: Medicare Other | Admitting: Family Medicine

## 2011-10-19 ENCOUNTER — Encounter: Payer: Self-pay | Admitting: Family Medicine

## 2011-10-19 VITALS — BP 153/76 | HR 64 | Temp 97.8°F | Ht 69.5 in | Wt 200.0 lb

## 2011-10-19 DIAGNOSIS — R7309 Other abnormal glucose: Secondary | ICD-10-CM

## 2011-10-19 DIAGNOSIS — T7840XA Allergy, unspecified, initial encounter: Secondary | ICD-10-CM

## 2011-10-19 DIAGNOSIS — F341 Dysthymic disorder: Secondary | ICD-10-CM

## 2011-10-19 DIAGNOSIS — E785 Hyperlipidemia, unspecified: Secondary | ICD-10-CM

## 2011-10-19 DIAGNOSIS — R739 Hyperglycemia, unspecified: Secondary | ICD-10-CM

## 2011-10-19 DIAGNOSIS — Z23 Encounter for immunization: Secondary | ICD-10-CM

## 2011-10-19 DIAGNOSIS — M5134 Other intervertebral disc degeneration, thoracic region: Secondary | ICD-10-CM | POA: Insufficient documentation

## 2011-10-19 DIAGNOSIS — R5383 Other fatigue: Secondary | ICD-10-CM

## 2011-10-19 DIAGNOSIS — R5381 Other malaise: Secondary | ICD-10-CM

## 2011-10-19 DIAGNOSIS — F411 Generalized anxiety disorder: Secondary | ICD-10-CM

## 2011-10-19 DIAGNOSIS — Z0000 Encounter for general adult medical examination without abnormal findings: Secondary | ICD-10-CM

## 2011-10-19 DIAGNOSIS — M51379 Other intervertebral disc degeneration, lumbosacral region without mention of lumbar back pain or lower extremity pain: Secondary | ICD-10-CM | POA: Insufficient documentation

## 2011-10-19 DIAGNOSIS — F419 Anxiety disorder, unspecified: Secondary | ICD-10-CM

## 2011-10-19 DIAGNOSIS — J45909 Unspecified asthma, uncomplicated: Secondary | ICD-10-CM

## 2011-10-19 DIAGNOSIS — G4700 Insomnia, unspecified: Secondary | ICD-10-CM

## 2011-10-19 DIAGNOSIS — I10 Essential (primary) hypertension: Secondary | ICD-10-CM | POA: Insufficient documentation

## 2011-10-19 DIAGNOSIS — IMO0001 Reserved for inherently not codable concepts without codable children: Secondary | ICD-10-CM

## 2011-10-19 DIAGNOSIS — R030 Elevated blood-pressure reading, without diagnosis of hypertension: Secondary | ICD-10-CM

## 2011-10-19 DIAGNOSIS — M5137 Other intervertebral disc degeneration, lumbosacral region: Secondary | ICD-10-CM

## 2011-10-19 DIAGNOSIS — F329 Major depressive disorder, single episode, unspecified: Secondary | ICD-10-CM

## 2011-10-19 MED ORDER — FLUTICASONE PROPIONATE 50 MCG/ACT NA SUSP: 2.0000 | Freq: Every day | NASAL | Status: DC

## 2011-10-19 MED ORDER — CETIRIZINE HCL 10 MG PO TABS: 10.0000 mg | ORAL_TABLET | Freq: Every day | ORAL | Status: DC | PRN

## 2011-10-19 MED ORDER — ALBUTEROL SULFATE HFA 108 (90 BASE) MCG/ACT IN AERS: 2.0000 | INHALATION_SPRAY | Freq: Four times a day (QID) | RESPIRATORY_TRACT | Status: DC | PRN

## 2011-10-19 MED ORDER — LORAZEPAM 0.5 MG PO TABS: 0.5000 mg | ORAL_TABLET | Freq: Every evening | ORAL | Status: AC | PRN

## 2011-10-19 MED ORDER — VENLAFAXINE HCL ER 150 MG PO TB24: 150.0000 mg | ORAL_TABLET | Freq: Every day | ORAL | Status: DC

## 2011-10-19 NOTE — Patient Instructions (Signed)
Diabetes Meal Planning Guide The diabetes meal planning guide is a tool to help you plan your meals and snacks. It is important for people with diabetes to manage their blood glucose (sugar) levels. Choosing the right foods and the right amounts throughout your day will help control your blood glucose. Eating right can even help you improve your blood pressure and reach or maintain a healthy weight. CARBOHYDRATE COUNTING MADE EASY When you eat carbohydrates, they turn to sugar. This raises your blood glucose level. Counting carbohydrates can help you control this level so you feel better. When you plan your meals by counting carbohydrates, you can have more flexibility in what you eat and balance your medicine with your food intake. Carbohydrate counting simply means adding up the total amount of carbohydrate grams in your meals and snacks. Try to eat about the same amount at each meal. Foods with carbohydrates are listed below. Each portion below is 1 carbohydrate serving or 15 grams of carbohydrates. Ask your dietician how many grams of carbohydrates you should eat at each meal or snack. Grains and Starches  1 slice bread.    English muffin or hotdog/hamburger bun.    cup cold cereal (unsweetened).   ? cup cooked pasta or rice.    cup starchy vegetables (corn, potatoes, peas, beans, winter squash).   1 tortilla (6 inches).    bagel.   1 waffle or pancake (size of a CD).    cup cooked cereal.   4 to 6 small crackers.  *Whole grain is recommended. Fruit  1 cup fresh unsweetened berries, melon, papaya, pineapple.   1 small fresh fruit.    banana or mango.    cup fruit juice (4 oz unsweetened).    cup canned fruit in natural juice or water.   2 tbs dried fruit.   12 to 15 grapes or cherries.  Milk and Yogurt  1 cup fat-free or 1% milk.   1 cup soy milk.   6 oz light yogurt with sugar-free sweetener.   6 oz low-fat soy yogurt.   6 oz plain yogurt.   Vegetables  1 cup raw or  cup cooked is counted as 0 carbohydrates or a "free" food.   If you eat 3 or more servings at 1 meal, count them as 1 carbohydrate serving.  Other Carbohydrates   oz chips or pretzels.    cup ice cream or frozen yogurt.    cup sherbet or sorbet.   2 inch square cake, no frosting.   1 tbs honey, sugar, jam, jelly, or syrup.   2 small cookies.   3 squares of graham crackers.   3 cups popcorn.   6 crackers.   1 cup broth-based soup.   Count 1 cup casserole or other mixed foods as 2 carbohydrate servings.   Foods with less than 20 calories in a serving may be counted as 0 carbohydrates or a "free" food.  You may want to purchase a book or computer software that lists the carbohydrate gram counts of different foods. In addition, the nutrition facts panel on the labels of the foods you eat are a good source of this information. The label will tell you how big the serving size is and the total number of carbohydrate grams you will be eating per serving. Divide this number by 15 to obtain the number of carbohydrate servings in a portion. Remember, 1 carbohydrate serving equals 15 grams of carbohydrate. SERVING SIZES Measuring foods and serving sizes helps you   make sure you are getting the right amount of food. The list below tells how big or small some common serving sizes are.  1 oz.........4 stacked dice.   3 oz........Marland KitchenDeck of cards.   1 tsp.......Marland KitchenTip of little finger.   1 tbs......Marland KitchenMarland KitchenThumb.   2 tbs.......Marland KitchenGolf ball.    cup......Marland KitchenHalf of a fist.   1 cup.......Marland KitchenA fist.  SAMPLE DIABETES MEAL PLAN Below is a sample meal plan that includes foods from the grain and starches, dairy, vegetable, fruit, and meat groups. A dietician can individualize a meal plan to fit your calorie needs and tell you the number of servings needed from each food group. However, controlling the total amount of carbohydrates in your meal or snack is more important than  making sure you include all of the food groups at every meal. You may interchange carbohydrate containing foods (dairy, starches, and fruits). The meal plan below is an example of a 2000 calorie diet using carbohydrate counting. This meal plan has 17 carbohydrate servings. Breakfast  1 cup oatmeal (2 carb servings).    cup light yogurt (1 carb serving).   1 cup blueberries (1 carb serving).    cup almonds.  Snack  1 large apple (2 carb servings).   1 low-fat string cheese stick.  Lunch  Chicken breast salad.   1 cup spinach.    cup chopped tomatoes.   2 oz chicken breast, sliced.   2 tbs low-fat [place] dressing.   12 whole-wheat crackers (2 carb servings).   12 to 15 grapes (1 carb serving).   1 cup low-fat milk (1 carb serving).  Snack  1 cup carrots.    cup hummus (1 carb serving).  Dinner  3 oz broiled salmon.   1 cup Brown rice (3 carb servings).  Snack  1  cups steamed broccoli (1 carb serving) drizzled with 1 tsp olive oil and lemon juice.   1 cup light pudding (2 carb servings).  DIABETES MEAL PLANNING WORKSHEET Your dietician can use this worksheet to help you decide how many servings of foods and what types of foods are right for you.  BREAKFAST Food Group and Servings / Carb Servings Grain/Starches __________________________________ Dairy __________________________________________ Vegetable ______________________________________ Fruit ___________________________________________ Meat __________________________________________ Fat ____________________________________________ LUNCH Food Group and Servings / Carb Servings Grain/Starches ___________________________________ Dairy ___________________________________________ Fruit ____________________________________________ Meat ___________________________________________ Fat _____________________________________________ Luis Brown    Food Group and Servings / Carb Servings Grain/Starches  ___________________________________ Dairy ___________________________________________ Fruit ____________________________________________ Meat ___________________________________________ Fat _____________________________________________ SNACKS Food Group and Servings / Carb Servings Grain/Starches ___________________________________ Dairy ___________________________________________ Vegetable _______________________________________ Fruit ____________________________________________ Meat ___________________________________________ Fat _____________________________________________ DAILY TOTALS Starches _________________________ Vegetable ________________________ Fruit ____________________________ Dairy ____________________________ Meat ____________________________ Fat ______________________________ Document Released: 03/31/2005 Document Revised: 06/23/2011 Document Reviewed: 02/09/2009 Exit[employer] Patient Information 2012 [place], [place].   Check blood sugars twice a week prior to breakfast and twice a week after dinner and keep a log

## 2011-10-20 LAB — RENAL FUNCTION PANEL
Albumin: 4.3 g/dL (ref 3.5–5.2)
BUN: 19 mg/dL (ref 6–23)
CO2: 31 mEq/L (ref 19–32)
Calcium: 9.3 mg/dL (ref 8.4–10.5)
Chloride: 102 mEq/L (ref 96–112)
Creat: 0.91 mg/dL (ref 0.50–1.35)
Glucose, Bld: 101 mg/dL — ABNORMAL HIGH (ref 70–99)
Phosphorus: 3.9 mg/dL (ref 2.3–4.6)
Potassium: 4.5 mEq/L (ref 3.5–5.3)
Sodium: 138 mEq/L (ref 135–145)

## 2011-10-25 ENCOUNTER — Encounter: Payer: Self-pay | Admitting: Family Medicine

## 2011-10-25 ENCOUNTER — Telehealth: Payer: Self-pay

## 2011-10-25 DIAGNOSIS — Z0000 Encounter for general adult medical examination without abnormal findings: Secondary | ICD-10-CM | POA: Insufficient documentation

## 2011-10-25 DIAGNOSIS — F341 Dysthymic disorder: Secondary | ICD-10-CM | POA: Insufficient documentation

## 2011-10-25 DIAGNOSIS — E119 Type 2 diabetes mellitus without complications: Secondary | ICD-10-CM | POA: Insufficient documentation

## 2011-10-25 DIAGNOSIS — T7840XA Allergy, unspecified, initial encounter: Secondary | ICD-10-CM | POA: Insufficient documentation

## 2011-10-25 DIAGNOSIS — F419 Anxiety disorder, unspecified: Secondary | ICD-10-CM | POA: Insufficient documentation

## 2011-10-25 DIAGNOSIS — E785 Hyperlipidemia, unspecified: Secondary | ICD-10-CM | POA: Insufficient documentation

## 2011-10-25 DIAGNOSIS — R5383 Other fatigue: Secondary | ICD-10-CM | POA: Insufficient documentation

## 2011-10-25 DIAGNOSIS — G4700 Insomnia, unspecified: Secondary | ICD-10-CM | POA: Insufficient documentation

## 2011-10-25 DIAGNOSIS — E663 Overweight: Secondary | ICD-10-CM | POA: Insufficient documentation

## 2011-10-25 DIAGNOSIS — E1165 Type 2 diabetes mellitus with hyperglycemia: Secondary | ICD-10-CM | POA: Insufficient documentation

## 2011-10-25 DIAGNOSIS — F32A Depression, unspecified: Secondary | ICD-10-CM

## 2011-10-25 HISTORY — DX: Depression, unspecified: F32.A

## 2011-10-25 HISTORY — DX: Anxiety disorder, unspecified: F41.9

## 2011-10-25 NOTE — Progress Notes (Signed)
[name], male   DOB: 09-16-1945, 66 y.o.   MRN: [id number] Luis Brown [id number] 10/16/1945 10/25/2011      Progress Note New Patient  Subjective  Chief Complaint  Chief Complaint  Patient presents with  . Establish Care    new patient    HPI  Patient is a 66 year old Caucasian male in today for new patient appointment. He struggles with allergies and has had nasal surgery in the past. Prior to that he was having a lot of sinus infections but those are improved at this time. Presently he does have some congestion and pressure. Does use antihistamines intermittently. No recent fevers or chills, headache. He struggles with cold sores intermittently and is using identifiable struggles at times. He acknowledges he struggling with increased fatigue, snoring. Also notes he is more irritable and is requesting an increase in his Effexor dosing. He's previously been told he is prediabetic but denies polyuria or polydipsia. Has trouble sleeping. Trouble falling asleep and staying asleep.  Past Medical History  Diagnosis Date  . Chicken pox as a child  . Measles as a child  . Allergy     grass and pollen  . DDD (degenerative disc disease), lumbosacral   . DDD (degenerative disc disease)     cervical responds to steroid injections and low back required surgery  . Elevated BP   . Asthma     exercise induced  . Diabetes mellitus     pre  . Hyperlipidemia   . Overweight   . Insomnia   . Fatigue   . Preventative health care   . Anxiety and depression 10/25/2011    Past Surgical History  Procedure Date  . Back surgery 2012 and 1994    Dr Charlesetta Garibaldi, screws and cage in low back  . Torn rotator cuff 2010    right  . Tonsillectomy     Family History  Problem Relation Age of Onset  . Hypertension Mother   . Diabetes Mother     type 2  . Cancer Mother 84    breast in remission  . Emphysema Father     smoker  . COPD Father     smoker  . Stroke Father 24    mini    . Heart disease Father   . Hypertension Sister   . Hyperlipidemia Sister   . Scoliosis Sister   . Hypertension Maternal Grandmother   . Scoliosis Maternal Grandmother   . Heart disease Maternal Grandfather   . Heart disease Paternal Grandfather     smoker    History   Social History  . Marital Status: Married    Spouse Name: N/A    Number of Children: N/A  . Years of Education: N/A   Occupational History  . Not on file.   Social History Main Topics  . Smoking status: Former Smoker -- 1.0 packs/day for 20 years    Types: Cigarettes    Quit date: 07/18/1989  . Smokeless tobacco: Never Used   Comment: off and on smoking for the 20 yrs  . Alcohol Use: Yes     drink or two a day- mixed drink  . Drug Use: No  . Sexually Active: Yes   Other Topics Concern  . Not on file   Social History Narrative  . No narrative on file    Current Outpatient Prescriptions on File Prior to Visit  Medication Sig Dispense Refill  . atorvastatin (LIPITOR) 10 MG tablet Take 10  mg by mouth daily.      Marland Kitchen albuterol (PROVENTIL HFA;VENTOLIN HFA) 108 (90 BASE) MCG/ACT inhaler Inhale 2 puffs into the lungs every 6 (six) hours as needed for wheezing. Try 1-2 puffs prior to exercise  1 Inhaler    . cetirizine (ZYRTEC) 10 MG tablet Take 1 tablet (10 mg total) by mouth daily as needed for allergies or rhinitis.  30 tablet  11  . fluticasone (FLONASE) 50 MCG/ACT nasal spray Place 2 sprays into the nose daily.  16 g  6  . metFORMIN (GLUCOPHAGE) 500 MG tablet Take 500 mg by mouth daily.        No Known Allergies  Review of Systems  Review of Systems  Constitutional: Positive for malaise/fatigue. Negative for fever.  HENT: Positive for congestion.   Eyes: Negative for discharge.  Respiratory: Negative for shortness of breath.   Cardiovascular: Negative for chest pain, palpitations and leg swelling.  Gastrointestinal: Negative for nausea, abdominal pain and diarrhea.  Genitourinary: Negative for  dysuria.  Musculoskeletal: Negative for falls.  Skin: Negative for rash.  Neurological: Negative for loss of consciousness and headaches.  Endo/Heme/Allergies: Negative for polydipsia.  Psychiatric/Behavioral: Positive for depression. Negative for suicidal ideas. The patient is nervous/anxious. The patient does not have insomnia.     Objective  BP 153/76  Pulse 64  Temp(Src) 97.8 F (36.6 C) (Temporal)  Ht 5' 9.5" (1.765 m)  Wt 200 lb (90.719 kg)  BMI 29.11 kg/m2  SpO2 96%  Physical Exam  Physical Exam  Constitutional: He is oriented to person, place, and time and well-developed, well-nourished, and in no distress. No distress.  HENT:  Head: Normocephalic and atraumatic.  Eyes: Conjunctivae are normal.  Neck: Neck supple. No thyromegaly present.  Cardiovascular: Normal rate, regular rhythm and normal heart sounds.   No murmur heard. Pulmonary/Chest: Effort normal and breath sounds normal. No respiratory distress.  Abdominal: He exhibits no distension and no mass. There is no tenderness.  Musculoskeletal: He exhibits no edema.  Neurological: He is alert and oriented to person, place, and time.  Skin: Skin is warm.  Psychiatric: Memory, affect and judgment normal.       Assessment & Plan  Asthma May use Albuterol prn and prior to exercise  Elevated BP Encouraged DASH diet, minimize sodium,  Increase exercise. Recheck at next visit  DDD (degenerative disc disease), lumbosacral Manages pain with 2 Tramadol bid but his pain has been steadily improving, so he is encouraged to try cutting back on his Tramadol to 1 bid and then only as needed use to see if he tolerates this and his fatigue improves.  Fatigue Likely multifactorial, needs to have   Allergic state Encouraged daily antihistamines and Flonase  Hyperlipidemia Avoid trans fats and increase exercise. Start MegaRed and check lipid panel  Preventative health care Patient is going to check with his insurance  and make sure they cover the Zostavax, he is given his pneumonia shot today. Request old records and monitor labs  Anxiety and depression Patient on effexor and feels he has increased irritability recently, we will increase dosing

## 2011-10-25 NOTE — Assessment & Plan Note (Signed)
Encouraged daily antihistamines and Flonase

## 2011-10-25 NOTE — Assessment & Plan Note (Signed)
May use Albuterol prn and prior to exercise

## 2011-10-25 NOTE — Assessment & Plan Note (Signed)
Patient on effexor and feels he has increased irritability recently, we will increase dosing

## 2011-10-25 NOTE — Assessment & Plan Note (Signed)
Manages pain with 2 Tramadol bid but his pain has been steadily improving, so he is encouraged to try cutting back on his Tramadol to 1 bid and then only as needed use to see if he tolerates this and his fatigue improves.

## 2011-10-25 NOTE — Telephone Encounter (Signed)
Pt states he would like MD to know that he is still very tired and mentioned that MD had talked about Testosterone? Patient would like to know what the next step is? Please advise?

## 2011-10-25 NOTE — Assessment & Plan Note (Signed)
Likely multifactorial, needs to have

## 2011-10-25 NOTE — Assessment & Plan Note (Signed)
Encouraged DASH diet, minimize sodium,  Increase exercise. Recheck at next visit

## 2011-10-25 NOTE — Assessment & Plan Note (Signed)
Patient is going to check with his insurance and make sure they cover the Zostavax, he is given his pneumonia shot today. Request old records and monitor labs

## 2011-10-25 NOTE — Assessment & Plan Note (Signed)
Avoid trans fats and increase exercise. Start MegaRed and check lipid panel

## 2011-10-25 NOTE — Telephone Encounter (Signed)
Pt informed that we are waiting on his lab results from [place]. Then we can proceed with labs. Also patient doesn't want to do the sleep study at this time.

## 2011-11-18 ENCOUNTER — Other Ambulatory Visit: Payer: Self-pay

## 2011-11-18 DIAGNOSIS — T7840XA Allergy, unspecified, initial encounter: Secondary | ICD-10-CM

## 2011-11-18 MED ORDER — FLUTICASONE PROPIONATE 50 MCG/ACT NA SUSP: 2.0000 | Freq: Every day | NASAL | Status: DC

## 2011-12-21 ENCOUNTER — Telehealth: Payer: Self-pay

## 2011-12-21 NOTE — Telephone Encounter (Signed)
Patient states the Venlafaxine 150 is making patient very tired and would like to go back to the 75 mg? Please advise and send a new RX to pharmacy

## 2011-12-21 NOTE — Telephone Encounter (Signed)
Ok to return to 75 mg of the Venlafaxine daily, he could also try Venlafaxine 75 mg po bid and see if he tolerates that better. Can give a one month's supply with 2 rf

## 2011-12-22 MED ORDER — VENLAFAXINE HCL 75 MG PO TABS: 75.0000 mg | ORAL_TABLET | Freq: Two times a day (BID) | ORAL | Status: DC

## 2011-12-22 NOTE — Telephone Encounter (Signed)
Left detailed message on patients home answering machine and sent RX to pharmacy

## 2012-01-10 ENCOUNTER — Other Ambulatory Visit: Payer: Self-pay

## 2012-01-10 MED ORDER — VENLAFAXINE HCL 75 MG PO TABS: 75.0000 mg | ORAL_TABLET | Freq: Two times a day (BID) | ORAL | Status: DC

## 2012-02-16 ENCOUNTER — Other Ambulatory Visit: Payer: Self-pay

## 2012-02-16 MED ORDER — LORAZEPAM 0.5 MG PO TABS: 0.5000 mg | ORAL_TABLET | Freq: Every evening | ORAL | Status: DC | PRN

## 2012-02-16 NOTE — Telephone Encounter (Signed)
Please advise refill? Last RX wrote on 10-19-11 quantity 30 with 1 refill.  Fax to [phone]

## 2012-02-17 NOTE — Telephone Encounter (Signed)
Faxed to pharmacy

## 2012-04-23 ENCOUNTER — Ambulatory Visit (INDEPENDENT_AMBULATORY_CARE_PROVIDER_SITE_OTHER): Payer: Medicare Other | Admitting: Family Medicine

## 2012-04-23 ENCOUNTER — Encounter: Payer: Self-pay | Admitting: Family Medicine

## 2012-04-23 VITALS — BP 161/72 | HR 67 | Temp 98.2°F | Ht 69.5 in | Wt 197.8 lb

## 2012-04-23 DIAGNOSIS — IMO0001 Reserved for inherently not codable concepts without codable children: Secondary | ICD-10-CM

## 2012-04-23 DIAGNOSIS — E663 Overweight: Secondary | ICD-10-CM

## 2012-04-23 DIAGNOSIS — E785 Hyperlipidemia, unspecified: Secondary | ICD-10-CM

## 2012-04-23 DIAGNOSIS — R5381 Other malaise: Secondary | ICD-10-CM

## 2012-04-23 DIAGNOSIS — R319 Hematuria, unspecified: Secondary | ICD-10-CM

## 2012-04-23 DIAGNOSIS — I10 Essential (primary) hypertension: Secondary | ICD-10-CM

## 2012-04-23 DIAGNOSIS — R7309 Other abnormal glucose: Secondary | ICD-10-CM

## 2012-04-23 DIAGNOSIS — R5383 Other fatigue: Secondary | ICD-10-CM

## 2012-04-23 DIAGNOSIS — R350 Frequency of micturition: Secondary | ICD-10-CM

## 2012-04-23 DIAGNOSIS — Z23 Encounter for immunization: Secondary | ICD-10-CM

## 2012-04-23 DIAGNOSIS — R030 Elevated blood-pressure reading, without diagnosis of hypertension: Secondary | ICD-10-CM

## 2012-04-23 DIAGNOSIS — N529 Male erectile dysfunction, unspecified: Secondary | ICD-10-CM

## 2012-04-23 DIAGNOSIS — R739 Hyperglycemia, unspecified: Secondary | ICD-10-CM

## 2012-04-23 DIAGNOSIS — N400 Enlarged prostate without lower urinary tract symptoms: Secondary | ICD-10-CM

## 2012-04-23 HISTORY — DX: Benign prostatic hyperplasia without lower urinary tract symptoms: N40.0

## 2012-04-23 HISTORY — DX: Male erectile dysfunction, unspecified: N52.9

## 2012-04-23 LAB — POCT URINALYSIS DIPSTICK
Bilirubin, UA: NEGATIVE
Glucose, UA: NEGATIVE
Ketones, UA: NEGATIVE
Leukocytes, UA: NEGATIVE
Nitrite, UA: NEGATIVE
Protein, UA: NEGATIVE
Spec Grav, UA: 1.02
Urobilinogen, UA: 0.2
pH, UA: 5.5

## 2012-04-23 LAB — CBC
HCT: 41.9 % (ref 39.0–52.0)
Hemoglobin: 14.4 g/dL (ref 13.0–17.0)
MCH: 32.5 pg (ref 26.0–34.0)
MCHC: 34.4 g/dL (ref 30.0–36.0)
MCV: 94.6 fL (ref 78.0–100.0)
Platelets: 357 10*3/uL (ref 150–400)
RBC: 4.43 MIL/uL (ref 4.22–5.81)
RDW: 12.6 % (ref 11.5–15.5)
WBC: 7.9 10*3/uL (ref 4.0–10.5)

## 2012-04-23 MED ORDER — LISINOPRIL 5 MG PO TABS: 5.0000 mg | ORAL_TABLET | Freq: Every day | ORAL | Status: DC

## 2012-04-23 NOTE — Patient Instructions (Addendum)

## 2012-04-23 NOTE — Assessment & Plan Note (Signed)
Patient frustrated with weight gain, will check TSh and discuss options at next visit, consider DASH diet

## 2012-04-23 NOTE — Assessment & Plan Note (Signed)
Avoid simple carbs and check a hgba1c level today, consider medications as indicated

## 2012-04-23 NOTE — Assessment & Plan Note (Signed)
Check lipid panel, avoid trans fats

## 2012-04-23 NOTE — Assessment & Plan Note (Signed)
Encouraged DASH diet and started on Lisinopril 5 mg daily

## 2012-04-23 NOTE — Assessment & Plan Note (Signed)
Symptoms well controlled on Tamulosin, has not seen urology a in a couple of years.

## 2012-04-23 NOTE — Progress Notes (Signed)
[name], male   DOB: August 20, 1945, 66 y.o.   MRN: [id number] Luis Brown [id number] October 11, 1945 04/23/2012      Progress Note-Follow Up  Subjective  Chief Complaint  Chief Complaint  Patient presents with  . discuss testosterone    HPI  Is a 66 year old Caucasian male who is in today for followup and multiple concerns. He is worried about persistent and worsening fatigue. His weight gain. He reports a long history of ED which has worsened over the last year. He underwent lower back surgery last year and is wheezing somewhat worse since then. He denies any GU complaints such as incontinence. No bowel concerns. No recent illness, chest pain, palpitations, shortness of breath, acute illness. He does have deconditioning and notes with exertion he gets more flushed and winded.  Past Medical History  Diagnosis Date  . Chicken pox as a child  . Measles as a child  . Allergy     grass and pollen  . DDD (degenerative disc disease), lumbosacral   . DDD (degenerative disc disease)     cervical responds to steroid injections and low back required surgery  . Elevated BP   . Asthma     exercise induced  . Diabetes mellitus     pre  . Hyperlipidemia   . Overweight   . Insomnia   . Fatigue   . Preventative health care   . Anxiety and depression 10/25/2011  . BPH (benign prostatic hyperplasia) 04/23/2012  . ED (erectile dysfunction) 04/23/2012  . HTN (hypertension)   . Hyperglycemia     preDM     Past Surgical History  Procedure Date  . Back surgery 2012 and 1994    Dr Charlesetta Garibaldi, screws and cage in low back  . Torn rotator cuff 2010    right  . Tonsillectomy     Family History  Problem Relation Age of Onset  . Hypertension Mother   . Diabetes Mother     type 2  . Cancer Mother 67    breast in remission  . Emphysema Father     smoker  . COPD Father     smoker  . Stroke Father 55    mini  . Heart disease Father   . Hypertension Sister   . Hyperlipidemia  Sister   . Scoliosis Sister   . Hypertension Maternal Grandmother   . Scoliosis Maternal Grandmother   . Heart disease Maternal Grandfather   . Heart disease Paternal Grandfather     smoker    History   Social History  . Marital Status: Married    Spouse Name: N/A    Number of Children: N/A  . Years of Education: N/A   Occupational History  . Not on file.   Social History Main Topics  . Smoking status: Former Smoker -- 1.0 packs/day for 20 years    Types: Cigarettes    Quit date: 07/18/1989  . Smokeless tobacco: Never Used   Comment: off and on smoking for the 20 yrs  . Alcohol Use: Yes     drink or two a day- mixed drink  . Drug Use: No  . Sexually Active: Yes   Other Topics Concern  . Not on file   Social History Narrative  . No narrative on file    Current Outpatient Prescriptions on File Prior to Visit  Medication Sig Dispense Refill  . albuterol (PROVENTIL HFA;VENTOLIN HFA) 108 (90 BASE) MCG/ACT inhaler Inhale 2 puffs into the lungs  every 6 (six) hours as needed for wheezing. Try 1-2 puffs prior to exercise  1 Inhaler    . aspirin 81 MG tablet Take 81 mg by mouth daily.      Marland Kitchen atorvastatin (LIPITOR) 10 MG tablet Take 5 mg by mouth daily.       Marland Kitchen b complex vitamins tablet Take 1 tablet by mouth daily.      . celecoxib (CELEBREX) 200 MG capsule Take 200 mg by mouth 2 (two) times daily.      . cetirizine (ZYRTEC) 10 MG tablet Take 1 tablet (10 mg total) by mouth daily as needed for allergies or rhinitis.  30 tablet  11  . ECHINACEA PLUS CAPS Take 1 capsule by mouth as needed. 750 mg      . fluticasone (FLONASE) 50 MCG/ACT nasal spray Place 2 sprays into the nose daily.  48 g  1  . LORazepam (ATIVAN) 0.5 MG tablet Take 1 tablet (0.5 mg total) by mouth at bedtime as needed.  30 tablet  1  . Multiple Vitamin (MULTIVITAMIN) tablet Take 1 tablet by mouth daily.      . Tamsulosin HCl (FLOMAX) 0.4 MG CAPS Take 0.4 mg by mouth daily.      . traMADol (ULTRAM) 50 MG tablet  Take 50 mg by mouth 4 (four) times daily.      Marland Kitchen acyclovir (ZOVIRAX) 200 MG capsule       . lisinopril (PRINIVIL,ZESTRIL) 5 MG tablet Take 1 tablet (5 mg total) by mouth daily.  30 tablet  5  . metFORMIN (GLUCOPHAGE) 500 MG tablet Take 500 mg by mouth daily.        No Known Allergies  Review of Systems  Review of Systems  Constitutional: Positive for malaise/fatigue. Negative for fever.  HENT: Negative for congestion.   Eyes: Negative for discharge.  Respiratory: Negative for shortness of breath.   Cardiovascular: Negative for chest pain, palpitations and leg swelling.  Gastrointestinal: Negative for nausea, abdominal pain and diarrhea.  Genitourinary: Negative for dysuria.  Musculoskeletal: Negative for falls.  Skin: Negative for rash.  Neurological: Negative for loss of consciousness and headaches.  Endo/Heme/Allergies: Negative for polydipsia.  Psychiatric/Behavioral: Negative for depression and suicidal ideas. The patient is not nervous/anxious and does not have insomnia.     Objective  BP 161/72  Pulse 67  Temp 98.2 F (36.8 C) (Temporal)  Ht 5' 9.5" (1.765 m)  Wt 197 lb 12.8 oz (89.721 kg)  BMI 28.79 kg/m2  SpO2 97%  Physical Exam  Physical Exam  Constitutional: He is oriented to person, place, and time and well-developed, well-nourished, and in no distress. No distress.  HENT:  Head: Normocephalic and atraumatic.  Eyes: Conjunctivae normal are normal.  Neck: Neck supple. No thyromegaly present.  Cardiovascular: Normal rate, regular rhythm and normal heart sounds.   No murmur heard. Pulmonary/Chest: Effort normal and breath sounds normal. No respiratory distress.  Abdominal: He exhibits no distension and no mass. There is no tenderness.  Musculoskeletal: He exhibits no edema.  Neurological: He is alert and oriented to person, place, and time.  Skin: Skin is warm.  Psychiatric: Memory, affect and judgment normal.     Lab Results  Component Value Date    WBC 8.0 04/08/2011   HGB 9.1* 04/14/2011   HCT 26.3* 04/14/2011   MCV 96.3 04/08/2011   PLT 286 04/08/2011   Lab Results  Component Value Date   CREATININE 0.91 10/19/2011   BUN 19 10/19/2011   NA 138  10/19/2011   K 4.5 10/19/2011   CL 102 10/19/2011   CO2 31 10/19/2011   Lab Results  Component Value Date   ALT 14 04/08/2011   AST 28 04/08/2011   ALKPHOS 85 04/08/2011   BILITOT 0.8 04/08/2011   Lab Results  Component Value Date   CHOL  Value: 168        ATP III CLASSIFICATION:  <200     mg/dL   Desirable  696-295  mg/dL   Borderline High  >=284    mg/dL   High 1/32/4401   Lab Results  Component Value Date   HDL 69 01/29/2007   Lab Results  Component Value Date   LDLCALC  Value: 81        Total Cholesterol/HDL:CHD Risk Coronary Heart Disease Risk Table                     Men   Women  1/2 Average Risk   3.4   3.3 01/29/2007   Lab Results  Component Value Date   TRIG 89 01/29/2007   Lab Results  Component Value Date   CHOLHDL 2.4 01/29/2007     Assessment & Plan  ED (erectile dysfunction) Tried Viagra in past without great results, will check psa and testosterone levels and consider medications at  Next visit if indicated  HTN (hypertension) Encouraged DASH diet and started on Lisinopril 5 mg daily  BPH (benign prostatic hyperplasia) Symptoms well controlled on Tamulosin, has not seen urology a in a couple of years.  Hyperlipidemia Check lipid panel, avoid trans fats  Hyperglycemia Avoid simple carbs and check a hgba1c level today, consider medications as indicated  Overweight Patient frustrated with weight gain, will check TSh and discuss options at next visit, consider DASH diet

## 2012-04-23 NOTE — Assessment & Plan Note (Signed)
Tried Viagra in past without great results, will check psa and testosterone levels and consider medications at  Next visit if indicated

## 2012-04-24 LAB — HEPATIC FUNCTION PANEL
ALT: 13 U/L (ref 0–53)
AST: 25 U/L (ref 0–37)
Albumin: 4.3 g/dL (ref 3.5–5.2)
Alkaline Phosphatase: 69 U/L (ref 39–117)
Bilirubin, Direct: 0.1 mg/dL (ref 0.0–0.3)
Indirect Bilirubin: 0.6 mg/dL (ref 0.0–0.9)
Total Bilirubin: 0.7 mg/dL (ref 0.3–1.2)
Total Protein: 6.4 g/dL (ref 6.0–8.3)

## 2012-04-24 LAB — TESTOSTERONE: Testosterone: 111.22 ng/dL — ABNORMAL LOW (ref 300–890)

## 2012-04-24 LAB — LIPID PANEL
Cholesterol: 170 mg/dL (ref 0–200)
HDL: 66 mg/dL (ref >39)
LDL Cholesterol: 59 mg/dL (ref 0–99)
Total CHOL/HDL Ratio: 2.6 Ratio
Triglycerides: 223 mg/dL — ABNORMAL HIGH (ref <150)
VLDL: 45 mg/dL — ABNORMAL HIGH (ref 0–40)

## 2012-04-24 LAB — MICROALBUMIN / CREATININE URINE RATIO
Creatinine, Urine: 90.8 mg/dL
Microalb Creat Ratio: 6.7 mg/g (ref 0.0–30.0)
Microalb, Ur: 0.61 mg/dL (ref 0.00–1.89)

## 2012-04-24 LAB — TSH: TSH: 3.045 u[IU]/mL (ref 0.350–4.500)

## 2012-04-24 LAB — HEMOGLOBIN A1C
Hgb A1c MFr Bld: 6.2 % — ABNORMAL HIGH (ref <5.7)
Mean Plasma Glucose: 131 mg/dL — ABNORMAL HIGH (ref <117)

## 2012-04-24 LAB — PSA: PSA: 0.28 ng/mL (ref <=4.00)

## 2012-04-24 MED ORDER — METFORMIN HCL 500 MG PO TABS: 500.0000 mg | ORAL_TABLET | Freq: Every day | ORAL | Status: DC

## 2012-04-24 MED ORDER — TESTOSTERONE CYPIONATE 200 MG/ML IM SOLN: 100.0000 mg | INTRAMUSCULAR | Status: DC

## 2012-04-24 NOTE — Addendum Note (Signed)
Addended by: Court Joy on: 04/24/2012 04:48 PM   Modules accepted: Orders

## 2012-04-25 ENCOUNTER — Ambulatory Visit (INDEPENDENT_AMBULATORY_CARE_PROVIDER_SITE_OTHER): Payer: Medicare Other

## 2012-04-25 ENCOUNTER — Ambulatory Visit: Payer: Medicare Other

## 2012-04-25 DIAGNOSIS — E349 Endocrine disorder, unspecified: Secondary | ICD-10-CM

## 2012-04-25 DIAGNOSIS — E291 Testicular hypofunction: Secondary | ICD-10-CM

## 2012-04-25 LAB — URINE CULTURE: Colony Count: 5000

## 2012-04-25 MED ORDER — TESTOSTERONE CYPIONATE 200 MG/ML IM SOLN
100.0000 mg | INTRAMUSCULAR | Status: DC
  Administered 2012-04-25: 100 mg via INTRAMUSCULAR

## 2012-04-25 NOTE — Progress Notes (Signed)
  Subjective:    [name], male    DOB: [birth year], 66 y.o.   MRN: [id number]  HPI    Review of Systems     Objective:   Physical Exam        Assessment & Plan:  Patient came in today for his testosterone injection. Patient tolerated injection well

## 2012-04-26 ENCOUNTER — Other Ambulatory Visit: Payer: Self-pay

## 2012-04-26 MED ORDER — GLUCOSE BLOOD VI STRP: ORAL_STRIP | Status: DC

## 2012-04-26 NOTE — Telephone Encounter (Signed)
Pt called stating he needs his glucose test strips called in .  I called pt and asked which machine he has and he states he will call back with the name of the machine.

## 2012-05-23 ENCOUNTER — Encounter: Payer: Self-pay | Admitting: Family Medicine

## 2012-05-23 ENCOUNTER — Ambulatory Visit (INDEPENDENT_AMBULATORY_CARE_PROVIDER_SITE_OTHER): Payer: Medicare Other | Admitting: Family Medicine

## 2012-05-23 VITALS — BP 127/79 | HR 87 | Temp 97.6°F | Ht 69.5 in | Wt 200.8 lb

## 2012-05-23 DIAGNOSIS — G4700 Insomnia, unspecified: Secondary | ICD-10-CM

## 2012-05-23 DIAGNOSIS — E349 Endocrine disorder, unspecified: Secondary | ICD-10-CM

## 2012-05-23 DIAGNOSIS — R5381 Other malaise: Secondary | ICD-10-CM

## 2012-05-23 DIAGNOSIS — R5383 Other fatigue: Secondary | ICD-10-CM

## 2012-05-23 DIAGNOSIS — N529 Male erectile dysfunction, unspecified: Secondary | ICD-10-CM

## 2012-05-23 DIAGNOSIS — I10 Essential (primary) hypertension: Secondary | ICD-10-CM

## 2012-05-23 DIAGNOSIS — E291 Testicular hypofunction: Secondary | ICD-10-CM

## 2012-05-23 HISTORY — DX: Endocrine disorder, unspecified: E34.9

## 2012-05-23 MED ORDER — SILDENAFIL CITRATE 100 MG PO TABS: 50.0000 mg | ORAL_TABLET | Freq: Every day | ORAL | Status: DC | PRN

## 2012-05-23 MED ORDER — DIAZEPAM 10 MG PO TABS: ORAL_TABLET | ORAL | Status: DC

## 2012-05-23 NOTE — Assessment & Plan Note (Signed)
improving

## 2012-05-23 NOTE — Assessment & Plan Note (Signed)
Very low, is warned regarding possible cardiac events or stroke will treat short term and reassess with labs in 2 months

## 2012-05-23 NOTE — Patient Instructions (Addendum)
Diabetes and Exercise  Regular exercise is important and can help:   · Control blood glucose (sugar).  · Decrease blood pressure.  ·   · Control blood lipids (cholesterol, triglycerides).  · Improve overall health.  BENEFITS FROM EXERCISE  · Improved fitness.  · Improved flexibility.  · Improved endurance.  · Increased bone density.  · Weight control.  · Increased muscle strength.  · Decreased body fat.  · Improvement of the body's use of insulin, a hormone.  · Increased insulin sensitivity.  · Reduction of insulin needs.  · Reduced stress and tension.  · Helps you feel better.  People with diabetes who add exercise to their lifestyle gain additional benefits, including:  · Weight loss.  · Reduced appetite.  · Improvement of the body's use of blood glucose.  · Decreased risk factors for heart disease:  · Lowering of cholesterol and triglycerides.  · Raising the level of good cholesterol (high-density lipoproteins, HDL).  · Lowering blood sugar.  · Decreased blood pressure.  TYPE 1 DIABETES AND EXERCISE  · Exercise will usually lower your blood glucose.  · If blood glucose is greater than 240 mg/dl, check urine ketones. If ketones are present, do not exercise.  · Location of the insulin injection sites may need to be adjusted with exercise. Avoid injecting insulin into areas of the body that will be exercised. For example, avoid injecting insulin into:  · The arms when playing tennis.  · The legs when jogging. For more information, discuss this with your caregiver.  · Keep a record of:  · Food intake.  · Type and amount of exercise.  · Expected peak times of insulin action.  · Blood glucose levels.  Do this before, during, and after exercise. Review your records with your caregiver. This will help you to develop guidelines for adjusting food intake and insulin amounts.   TYPE 2 DIABETES AND EXERCISE  · Regular physical activity can help control blood glucose.  · Exercise is important because it may:  · Increase the  body's sensitivity to insulin.  · Improve blood glucose control.  · Exercise reduces the risk of heart disease. It decreases serum cholesterol and triglycerides. It also lowers blood pressure.  · Those who take insulin or oral hypoglycemic agents should watch for signs of hypoglycemia. These signs include dizziness, shaking, sweating, chills, and confusion.  · Body water is lost during exercise. It must be replaced. This will help to avoid loss of body fluids (dehydration) or heat stroke.  Be sure to talk to your caregiver before starting an exercise program to make sure it is safe for you. Remember, any activity is better than none.   Document Released: 09/24/2003 Document Revised: 09/26/2011 Document Reviewed: 01/08/2009  [employer]® Patient Information ©2013 [employer].

## 2012-05-23 NOTE — Assessment & Plan Note (Signed)
Given an rx for Viagra may try 25 to 100 mg dosing

## 2012-05-23 NOTE — Assessment & Plan Note (Signed)
Well controlled, no changes 

## 2012-05-23 NOTE — Assessment & Plan Note (Signed)
Lorazepam allows him to sleep 3-4 hours and then has trouble sleeping, will try switching to Diazepam prn

## 2012-05-23 NOTE — Progress Notes (Signed)
[name], male   DOB: 11-01-1945, 66 y.o.   MRN: [id number] JAYMES HANG [id number] 09-19-1945 05/23/2012      Progress Note-Follow Up  Subjective  Chief Complaint  Chief Complaint  Patient presents with  . Follow-up    1 month    HPI  Patient is a 66 year old Caucasian male who is in today for followup of her medical concerns. He notes that lorazepam helps him sleep for roughly 3 days a week for a good deal of time. Continues to struggle with fatigue. Blood sugars have been somewhat better. Notes his fingerstick glucose this morning was 107. Denies polyuria or polydipsia. No recent flares any shortness of breath coughing or wheezing. No chest pain, palpitations, shortness of breath. Other than his fatigue he continues to struggle with low mood but it is somewhat improved.  Past Medical History  Diagnosis Date  . Chicken pox as a child  . Measles as a child  . Allergy     grass and pollen  . DDD (degenerative disc disease), lumbosacral   . DDD (degenerative disc disease)     cervical responds to steroid injections and low back required surgery  . Elevated BP   . Asthma     exercise induced  . Diabetes mellitus     pre  . Hyperlipidemia   . Overweight   . Insomnia   . Fatigue   . Preventative health care   . Anxiety and depression 10/25/2011  . BPH (benign prostatic hyperplasia) 04/23/2012  . ED (erectile dysfunction) 04/23/2012  . HTN (hypertension)   . Hyperglycemia     preDM   . 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    Past Surgical History  Procedure Date  . Back surgery 2012 and 1994    Dr Charlesetta Garibaldi, screws and cage in low back  . Torn rotator cuff 2010    right  . Tonsillectomy     Family History  Problem Relation Age of Onset  . Hypertension Mother   . Diabetes Mother     type 2  . Cancer Mother 46    breast in remission  . Emphysema Father     smoker  . COPD Father     smoker  . Stroke Father 44    mini  . Heart disease Father   .  Hypertension Sister   . Hyperlipidemia Sister   . Scoliosis Sister   . Hypertension Maternal Grandmother   . Scoliosis Maternal Grandmother   . Heart disease Maternal Grandfather   . Heart disease Paternal Grandfather     smoker    History   Social History  . Marital Status: Married    Spouse Name: N/A    Number of Children: N/A  . Years of Education: N/A   Occupational History  . Not on file.   Social History Main Topics  . Smoking status: Former Smoker -- 1.0 packs/day for 20 years    Types: Cigarettes    Quit date: 07/18/1989  . Smokeless tobacco: Never Used     Comment: off and on smoking for the 20 yrs  . Alcohol Use: Yes     Comment: drink or two a day- mixed drink  . Drug Use: No  . Sexually Active: Yes   Other Topics Concern  . Not on file   Social History Narrative  . No narrative on file    Current Outpatient Prescriptions on File Prior to Visit  Medication Sig Dispense Refill  . aspirin  81 MG tablet Take 81 mg by mouth daily.      Marland Kitchen atorvastatin (LIPITOR) 10 MG tablet Take 5 mg by mouth daily.       Marland Kitchen b complex vitamins tablet Take 1 tablet by mouth daily.      . celecoxib (CELEBREX) 200 MG capsule Take 200 mg by mouth 2 (two) times daily.      Marland Kitchen ECHINACEA PLUS CAPS Take 1 capsule by mouth as needed. 750 mg      . fluticasone (FLONASE) 50 MCG/ACT nasal spray Place 2 sprays into the nose daily.  48 g  1  . glucose blood (ONE TOUCH ULTRA TEST) test strip Check daily  32 each  1  . lisinopril (PRINIVIL,ZESTRIL) 5 MG tablet Take 1 tablet (5 mg total) by mouth daily.  30 tablet  5  . metFORMIN (GLUCOPHAGE) 500 MG tablet Take 1 tablet (500 mg total) by mouth daily.  30 tablet  2  . Multiple Vitamin (MULTIVITAMIN) tablet Take 1 tablet by mouth daily.      . Tamsulosin HCl (FLOMAX) 0.4 MG CAPS Take 0.4 mg by mouth daily.      Marland Kitchen testosterone cypionate (DEPOTESTOTERONE CYPIONATE) 200 MG/ML injection Inject 0.5 mLs (100 mg total) into the muscle every 14 (fourteen)  days.  10 mL  0  . traMADol (ULTRAM) 50 MG tablet Take 50 mg by mouth 4 (four) times daily.      Marland Kitchen venlafaxine (EFFEXOR) 75 MG tablet Take 75 mg by mouth daily.      Marland Kitchen acyclovir (ZOVIRAX) 200 MG capsule       . albuterol (PROVENTIL HFA;VENTOLIN HFA) 108 (90 BASE) MCG/ACT inhaler Inhale 2 puffs into the lungs every 6 (six) hours as needed for wheezing. Try 1-2 puffs prior to exercise  1 Inhaler    . cetirizine (ZYRTEC) 10 MG tablet Take 1 tablet (10 mg total) by mouth daily as needed for allergies or rhinitis.  30 tablet  11  . sildenafil (VIAGRA) 100 MG tablet Take 0.5-1 tablets (50-100 mg total) by mouth daily as needed for erectile dysfunction.  10 tablet  1    No Known Allergies  Review of Systems  Review of Systems  Constitutional: Positive for malaise/fatigue. Negative for fever.  HENT: Negative for congestion.   Eyes: Negative for discharge.  Respiratory: Negative for shortness of breath.   Cardiovascular: Negative for chest pain, palpitations and leg swelling.  Gastrointestinal: Negative for nausea, abdominal pain and diarrhea.  Genitourinary: Negative for dysuria.  Musculoskeletal: Negative for falls.  Skin: Negative for rash.  Neurological: Negative for loss of consciousness and headaches.  Endo/Heme/Allergies: Negative for polydipsia.  Psychiatric/Behavioral: Negative for depression and suicidal ideas. The patient has insomnia. The patient is not nervous/anxious.     Objective  BP 127/79  Pulse 87  Temp 97.6 F (36.4 C) (Temporal)  Ht 5' 9.5" (1.765 m)  Wt 200 lb 12.8 oz (91.082 kg)  BMI 29.23 kg/m2  SpO2 97%  Physical Exam  Physical Exam  Constitutional: He is oriented to person, place, and time and well-developed, well-nourished, and in no distress. No distress.  HENT:  Head: Normocephalic and atraumatic.  Eyes: Conjunctivae normal are normal.  Neck: Neck supple. No thyromegaly present.  Cardiovascular: Normal rate, regular rhythm and normal heart sounds.     No murmur heard. Pulmonary/Chest: Effort normal and breath sounds normal. No respiratory distress.  Abdominal: He exhibits no distension and no mass. There is no tenderness.  Musculoskeletal: He exhibits no edema.  Neurological: He is alert and oriented to person, place, and time.  Skin: Skin is warm.  Psychiatric: Memory, affect and judgment normal.    Lab Results  Component Value Date   TSH 3.045 04/23/2012   Lab Results  Component Value Date   WBC 7.9 04/23/2012   HGB 14.4 04/23/2012   HCT 41.9 04/23/2012   MCV 94.6 04/23/2012   PLT 357 04/23/2012   Lab Results  Component Value Date   CREATININE 0.91 10/19/2011   BUN 19 10/19/2011   NA 138 10/19/2011   K 4.5 10/19/2011   CL 102 10/19/2011   CO2 31 10/19/2011   Lab Results  Component Value Date   ALT 13 04/23/2012   AST 25 04/23/2012   ALKPHOS 69 04/23/2012   BILITOT 0.7 04/23/2012   Lab Results  Component Value Date   CHOL 170 04/23/2012   Lab Results  Component Value Date   HDL 66 04/23/2012   Lab Results  Component Value Date   LDLCALC 59 04/23/2012   Lab Results  Component Value Date   TRIG 223* 04/23/2012   Lab Results  Component Value Date   CHOLHDL 2.6 04/23/2012     Assessment & Plan  HTN (hypertension) Well controlled, no changes  ED (erectile dysfunction) Given an rx for Viagra may try 25 to 100 mg dosing  Fatigue improving  Insomnia Lorazepam allows him to sleep 3-4 hours and then has trouble sleeping, will try switching to Diazepam prn  Testosterone deficiency Very low, is warned regarding possible cardiac events or stroke will treat short term and reassess with labs in 2 months

## 2012-05-23 NOTE — Assessment & Plan Note (Signed)
>>  ASSESSMENT AND PLAN FOR TESTOSTERONE DEFICIENCY WRITTEN ON 05/23/2012  8:05 PM BY BLYTH, STACEY A, MD  Very low, is warned regarding possible cardiac events or stroke will treat short term and reassess with labs in 2 months

## 2012-06-04 ENCOUNTER — Other Ambulatory Visit: Payer: Self-pay

## 2012-06-04 MED ORDER — VENLAFAXINE HCL 75 MG PO TABS: 75.0000 mg | ORAL_TABLET | Freq: Every day | ORAL | Status: DC

## 2012-07-23 ENCOUNTER — Encounter: Payer: Self-pay | Admitting: Family Medicine

## 2012-07-23 ENCOUNTER — Ambulatory Visit (INDEPENDENT_AMBULATORY_CARE_PROVIDER_SITE_OTHER): Payer: Medicare Other | Admitting: Family Medicine

## 2012-07-23 VITALS — BP 132/76 | HR 87 | Temp 97.2°F | Ht 69.5 in | Wt 202.8 lb

## 2012-07-23 DIAGNOSIS — Z0000 Encounter for general adult medical examination without abnormal findings: Secondary | ICD-10-CM

## 2012-07-23 DIAGNOSIS — I10 Essential (primary) hypertension: Secondary | ICD-10-CM

## 2012-07-23 DIAGNOSIS — R7309 Other abnormal glucose: Secondary | ICD-10-CM

## 2012-07-23 DIAGNOSIS — Z1211 Encounter for screening for malignant neoplasm of colon: Secondary | ICD-10-CM

## 2012-07-23 DIAGNOSIS — E291 Testicular hypofunction: Secondary | ICD-10-CM

## 2012-07-23 DIAGNOSIS — E785 Hyperlipidemia, unspecified: Secondary | ICD-10-CM

## 2012-07-23 DIAGNOSIS — R7989 Other specified abnormal findings of blood chemistry: Secondary | ICD-10-CM

## 2012-07-23 DIAGNOSIS — E349 Endocrine disorder, unspecified: Secondary | ICD-10-CM

## 2012-07-23 DIAGNOSIS — R739 Hyperglycemia, unspecified: Secondary | ICD-10-CM

## 2012-07-23 DIAGNOSIS — N529 Male erectile dysfunction, unspecified: Secondary | ICD-10-CM

## 2012-07-23 LAB — TESTOSTERONE: Testosterone: 257.05 ng/dL — ABNORMAL LOW (ref 350.00–890.00)

## 2012-07-23 NOTE — Assessment & Plan Note (Signed)
Good response to testosterone treatment initially but fatigue is increasing again. Will check levels today and consider increasing dosing is indicated

## 2012-07-23 NOTE — Assessment & Plan Note (Signed)
Good hemoglobin A1c. Minimize simple carbs continue metformin and recheck in 3 months time.

## 2012-07-23 NOTE — Assessment & Plan Note (Signed)
>>  ASSESSMENT AND PLAN FOR TESTOSTERONE DEFICIENCY WRITTEN ON 07/23/2012  4:38 PM BY BLYTH, STACEY A, MD  Good response to testosterone treatment initially but fatigue is increasing again. Will check levels today and consider increasing dosing is indicated

## 2012-07-23 NOTE — Assessment & Plan Note (Signed)
Did not pick up slightly due to cost. He will check with his pharmacist to see if he can get a better deal and is encouraged to try [employer]. In the meantime we're going to check his testosterone level and increase his treatment as indicated

## 2012-07-23 NOTE — Assessment & Plan Note (Signed)
Tolerating atorvastatin. Avoid transplants, increase exercise

## 2012-07-23 NOTE — Assessment & Plan Note (Signed)
Agrees to referral for screening colonoscopy. Reports last colonoscopy was back in 2002. Denies any GI complaints such as rectal bleeding or constipation.

## 2012-07-23 NOTE — Assessment & Plan Note (Signed)
Well treated no changes at this time

## 2012-07-23 NOTE — Progress Notes (Signed)
[name], male   DOB: Dec 16, 1945, 67 y.o.   MRN: [id number] Luis Brown [id number] 03/11/1946 07/23/2012      Progress Note-Follow Up  Subjective  Chief Complaint  Chief Complaint  Patient presents with  . Follow-up    2 month    HPI  Patient is a 67 year old Caucasian male who is in today for followup on his testosterone treatment and multiple medical problems. Overall he says he feels well. He has trouble with some mild respiratory symptoms over the holidays but says that largely resolved. He's having some low-grade nasal congestion but no fevers or chills. No significant rhinorrhea, ear pain, sore throat, chest, palpitations, shortness of breath, GI or GU concerns noted today. He reports that the testosterone treatment initially increased his energy level never did help with his ED. Now the fatigue is becoming worse again date knowledge some of that may be due to his acute illness. He reports overall feeling well and is pleased with his progress.  Past Medical History  Diagnosis Date  . Chicken pox as a child  . Measles as a child  . Allergy     grass and pollen  . DDD (degenerative disc disease), lumbosacral   . DDD (degenerative disc disease)     cervical responds to steroid injections and low back required surgery  . Elevated BP   . Asthma     exercise induced  . Diabetes mellitus     pre  . Hyperlipidemia   . Overweight   . Insomnia   . Fatigue   . Preventative health care   . Anxiety and depression 10/25/2011  . BPH (benign prostatic hyperplasia) 04/23/2012  . ED (erectile dysfunction) 04/23/2012  . HTN (hypertension)   . Hyperglycemia     preDM   . 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    Past Surgical History  Procedure Date  . Back surgery 2012 and 1994    Dr Charlesetta Garibaldi, screws and cage in low back  . Torn rotator cuff 2010    right  . Tonsillectomy     Family History  Problem Relation Age of Onset  . Hypertension Mother   . Diabetes Mother      type 2  . Cancer Mother 31    breast in remission  . Emphysema Father     smoker  . COPD Father     smoker  . Stroke Father 57    mini  . Heart disease Father   . Hypertension Sister   . Hyperlipidemia Sister   . Scoliosis Sister   . Hypertension Maternal Grandmother   . Scoliosis Maternal Grandmother   . Heart disease Maternal Grandfather   . Heart disease Paternal Grandfather     smoker    History   Social History  . Marital Status: Married    Spouse Name: N/A    Number of Children: N/A  . Years of Education: N/A   Occupational History  . Not on file.   Social History Main Topics  . Smoking status: Former Smoker -- 1.0 packs/day for 20 years    Types: Cigarettes    Quit date: 07/18/1989  . Smokeless tobacco: Never Used     Comment: off and on smoking for the 20 yrs  . Alcohol Use: Yes     Comment: drink or two a day- mixed drink  . Drug Use: No  . Sexually Active: Yes   Other Topics Concern  . Not on file   Social  History Narrative  . No narrative on file    Current Outpatient Prescriptions on File Prior to Visit  Medication Sig Dispense Refill  . albuterol (PROVENTIL HFA;VENTOLIN HFA) 108 (90 BASE) MCG/ACT inhaler Inhale 2 puffs into the lungs every 6 (six) hours as needed for wheezing. Try 1-2 puffs prior to exercise  1 Inhaler    . aspirin 81 MG tablet Take 81 mg by mouth daily.      Marland Kitchen atorvastatin (LIPITOR) 10 MG tablet Take 5 mg by mouth daily.       Marland Kitchen b complex vitamins tablet Take 1 tablet by mouth daily.      . celecoxib (CELEBREX) 200 MG capsule Take 200 mg by mouth 2 (two) times daily.      . diazepam (VALIUM) 10 MG tablet 1/2 to 1 tab po qhs prn insomnia  30 tablet  1  . ECHINACEA PLUS CAPS Take 1 capsule by mouth as needed. 750 mg      . fluticasone (FLONASE) 50 MCG/ACT nasal spray Place 2 sprays into the nose daily.  48 g  1  . glucose blood (ONE TOUCH ULTRA TEST) test strip Check daily  32 each  1  . lisinopril (PRINIVIL,ZESTRIL) 5 MG  tablet Take 1 tablet (5 mg total) by mouth daily.  30 tablet  5  . metFORMIN (GLUCOPHAGE) 500 MG tablet Take 1 tablet (500 mg total) by mouth daily.  30 tablet  2  . Multiple Vitamin (MULTIVITAMIN) tablet Take 1 tablet by mouth daily.      . Tamsulosin HCl (FLOMAX) 0.4 MG CAPS Take 0.4 mg by mouth daily.      Marland Kitchen testosterone cypionate (DEPOTESTOTERONE CYPIONATE) 200 MG/ML injection Inject 0.5 mLs (100 mg total) into the muscle every 14 (fourteen) days.  10 mL  0  . traMADol (ULTRAM) 50 MG tablet Take 50 mg by mouth 4 (four) times daily.      Marland Kitchen venlafaxine (EFFEXOR) 75 MG tablet Take 1 tablet (75 mg total) by mouth daily.  90 tablet  1  . cetirizine (ZYRTEC) 10 MG tablet Take 1 tablet (10 mg total) by mouth daily as needed for allergies or rhinitis.  30 tablet  11    No Known Allergies  Review of Systems  Review of Systems  Constitutional: Positive for malaise/fatigue. Negative for fever.  HENT: Positive for congestion. Negative for ear pain and sore throat.   Eyes: Negative for discharge.  Respiratory: Negative for cough, sputum production and shortness of breath.   Cardiovascular: Negative for chest pain, palpitations and leg swelling.  Gastrointestinal: Negative for nausea, abdominal pain and diarrhea.  Genitourinary: Negative for dysuria.  Musculoskeletal: Negative for falls.  Skin: Negative for rash.  Neurological: Negative for loss of consciousness and headaches.  Endo/Heme/Allergies: Negative for polydipsia.  Psychiatric/Behavioral: Negative for depression and suicidal ideas. The patient is not nervous/anxious and does not have insomnia.     Objective  BP 132/76  Pulse 87  Temp 97.2 F (36.2 C) (Temporal)  Ht 5' 9.5" (1.765 m)  Wt 202 lb 12.8 oz (91.989 kg)  BMI 29.52 kg/m2  SpO2 96%  Physical Exam  Physical Exam  Constitutional: He is oriented to person, place, and time and well-developed, well-nourished, and in no distress. No distress.  HENT:  Head: Normocephalic  and atraumatic.  Eyes: Conjunctivae normal are normal.  Neck: Neck supple. No thyromegaly present.  Cardiovascular: Normal rate, regular rhythm and normal heart sounds.   No murmur heard. Pulmonary/Chest: Effort normal and breath  sounds normal. No respiratory distress.  Abdominal: He exhibits no distension and no mass. There is no tenderness.  Musculoskeletal: He exhibits no edema.  Neurological: He is alert and oriented to person, place, and time.  Skin: Skin is warm.  Psychiatric: Memory, affect and judgment normal.    Lab Results  Component Value Date   TSH 3.045 04/23/2012   Lab Results  Component Value Date   WBC 7.9 04/23/2012   HGB 14.4 04/23/2012   HCT 41.9 04/23/2012   MCV 94.6 04/23/2012   PLT 357 04/23/2012   Lab Results  Component Value Date   CREATININE 0.91 10/19/2011   BUN 19 10/19/2011   NA 138 10/19/2011   K 4.5 10/19/2011   CL 102 10/19/2011   CO2 31 10/19/2011   Lab Results  Component Value Date   ALT 13 04/23/2012   AST 25 04/23/2012   ALKPHOS 69 04/23/2012   BILITOT 0.7 04/23/2012   Lab Results  Component Value Date   CHOL 170 04/23/2012   Lab Results  Component Value Date   HDL 66 04/23/2012   Lab Results  Component Value Date   LDLCALC 59 04/23/2012   Lab Results  Component Value Date   TRIG 223* 04/23/2012   Lab Results  Component Value Date   CHOLHDL 2.6 04/23/2012     Assessment & Plan  ED (erectile dysfunction) Did not pick up slightly due to cost. He will check with his pharmacist to see if he can get a better deal and is encouraged to try [employer]. In the meantime we're going to check his testosterone level and increase his treatment as indicated  HTN (hypertension) Well treated no changes at this time  Testosterone deficiency Good response to testosterone treatment initially but fatigue is increasing again. Will check levels today and consider increasing dosing is indicated  Hyperlipidemia Tolerating atorvastatin. Avoid transplants, increase  exercise  Hyperglycemia Good hemoglobin A1c. Minimize simple carbs continue metformin and recheck in 3 months time.  Preventative health care Agrees to referral for screening colonoscopy. Reports last colonoscopy was back in 2002. Denies any GI complaints such as rectal bleeding or constipation.

## 2012-07-24 MED ORDER — TESTOSTERONE CYPIONATE 200 MG/ML IM SOLN: 200.0000 mg | INTRAMUSCULAR | Status: DC

## 2012-07-24 NOTE — Addendum Note (Signed)
Addended by: Court Joy on: 07/24/2012 10:31 AM   Modules accepted: Orders

## 2012-08-21 ENCOUNTER — Other Ambulatory Visit: Payer: Self-pay | Admitting: Family Medicine

## 2012-08-21 NOTE — Telephone Encounter (Signed)
Pt states he needs Atorvastatin 20 mg tabs 1/2 daily- 45 tabs Tamsulosin hcl 0.4 mg caps daily 30 min after a meal- 90 tabs  [facility] [address]

## 2012-08-22 MED ORDER — ATORVASTATIN CALCIUM 20 MG PO TABS: 10.0000 mg | ORAL_TABLET | Freq: Every day | ORAL | Status: DC

## 2012-08-22 MED ORDER — TAMSULOSIN HCL 0.4 MG PO CAPS: 0.4000 mg | ORAL_CAPSULE | Freq: Every day | ORAL | Status: DC

## 2012-08-30 ENCOUNTER — Other Ambulatory Visit: Payer: Self-pay | Admitting: Family Medicine

## 2012-09-03 NOTE — Telephone Encounter (Signed)
Please advise refill? 

## 2012-09-12 ENCOUNTER — Ambulatory Visit (INDEPENDENT_AMBULATORY_CARE_PROVIDER_SITE_OTHER): Payer: Medicare Other

## 2012-09-12 DIAGNOSIS — E291 Testicular hypofunction: Secondary | ICD-10-CM

## 2012-09-12 DIAGNOSIS — E349 Endocrine disorder, unspecified: Secondary | ICD-10-CM

## 2012-09-12 MED ORDER — TESTOSTERONE CYPIONATE 200 MG/ML IM SOLN
200.0000 mg | INTRAMUSCULAR | Status: DC
  Administered 2012-09-12: 200 mg via INTRAMUSCULAR

## 2012-09-12 NOTE — Progress Notes (Signed)
  Subjective:    [name], male    DOB: [birth year], 67 y.o.   MRN: [id number]  HPI    Review of Systems     Objective:   Physical Exam        Assessment & Plan:  Patient came in today for his testosterone injection. Pt tolerated injection well.

## 2012-10-08 ENCOUNTER — Other Ambulatory Visit: Payer: Self-pay | Admitting: Family Medicine

## 2012-10-09 ENCOUNTER — Encounter: Payer: Self-pay | Admitting: Family Medicine

## 2012-10-09 NOTE — Telephone Encounter (Signed)
Diazepam request [last Rx 11.06.13 #30x1]/SLS Please advise.

## 2012-10-21 ENCOUNTER — Other Ambulatory Visit: Payer: Self-pay | Admitting: Family Medicine

## 2012-10-22 ENCOUNTER — Encounter: Payer: Medicare Other | Admitting: Family Medicine

## 2012-10-22 NOTE — Telephone Encounter (Signed)
Denial--Last Rx 03.24.14 #30x1-Too soon for refill request/SLS

## 2012-10-25 ENCOUNTER — Other Ambulatory Visit: Payer: Self-pay | Admitting: Family Medicine

## 2012-10-25 ENCOUNTER — Ambulatory Visit (INDEPENDENT_AMBULATORY_CARE_PROVIDER_SITE_OTHER): Payer: Medicare Other | Admitting: Family Medicine

## 2012-10-25 ENCOUNTER — Encounter: Payer: Self-pay | Admitting: Family Medicine

## 2012-10-25 VITALS — BP 120/78 | HR 96 | Temp 98.0°F | Ht 69.5 in | Wt 194.0 lb

## 2012-10-25 DIAGNOSIS — Z5189 Encounter for other specified aftercare: Secondary | ICD-10-CM

## 2012-10-25 DIAGNOSIS — Z0000 Encounter for general adult medical examination without abnormal findings: Secondary | ICD-10-CM

## 2012-10-25 DIAGNOSIS — L578 Other skin changes due to chronic exposure to nonionizing radiation: Secondary | ICD-10-CM

## 2012-10-25 DIAGNOSIS — Z86010 Personal history of colonic polyps: Secondary | ICD-10-CM

## 2012-10-25 DIAGNOSIS — E349 Endocrine disorder, unspecified: Secondary | ICD-10-CM

## 2012-10-25 DIAGNOSIS — E785 Hyperlipidemia, unspecified: Secondary | ICD-10-CM

## 2012-10-25 DIAGNOSIS — E291 Testicular hypofunction: Secondary | ICD-10-CM

## 2012-10-25 DIAGNOSIS — T7840XD Allergy, unspecified, subsequent encounter: Secondary | ICD-10-CM

## 2012-10-25 DIAGNOSIS — E119 Type 2 diabetes mellitus without complications: Secondary | ICD-10-CM

## 2012-10-25 DIAGNOSIS — I10 Essential (primary) hypertension: Secondary | ICD-10-CM

## 2012-10-25 LAB — BASIC METABOLIC PANEL
BUN: 18 mg/dL (ref 6–23)
CO2: 29 mEq/L (ref 19–32)
Calcium: 9.5 mg/dL (ref 8.4–10.5)
Chloride: 102 mEq/L (ref 96–112)
Creat: 0.96 mg/dL (ref 0.50–1.35)
Glucose, Bld: 113 mg/dL — ABNORMAL HIGH (ref 70–99)
Potassium: 4.9 mEq/L (ref 3.5–5.3)
Sodium: 138 mEq/L (ref 135–145)

## 2012-10-25 LAB — CBC
HCT: 46.7 % (ref 39.0–52.0)
Hemoglobin: 16.5 g/dL (ref 13.0–17.0)
MCH: 33.3 pg (ref 26.0–34.0)
MCHC: 35.3 g/dL (ref 30.0–36.0)
MCV: 94.2 fL (ref 78.0–100.0)
Platelets: 302 10*3/uL (ref 150–400)
RBC: 4.96 MIL/uL (ref 4.22–5.81)
RDW: 13.2 % (ref 11.5–15.5)
WBC: 6.6 10*3/uL (ref 4.0–10.5)

## 2012-10-25 LAB — LIPID PANEL
Cholesterol: 147 mg/dL (ref 0–200)
HDL: 63 mg/dL (ref >39)
LDL Cholesterol: 71 mg/dL (ref 0–99)
Total CHOL/HDL Ratio: 2.3 Ratio
Triglycerides: 67 mg/dL (ref <150)
VLDL: 13 mg/dL (ref 0–40)

## 2012-10-25 LAB — HEPATIC FUNCTION PANEL
ALT: 12 U/L (ref 0–53)
AST: 24 U/L (ref 0–37)
Albumin: 4.5 g/dL (ref 3.5–5.2)
Alkaline Phosphatase: 69 U/L (ref 39–117)
Bilirubin, Direct: 0.3 mg/dL (ref 0.0–0.3)
Indirect Bilirubin: 1.2 mg/dL — ABNORMAL HIGH (ref 0.0–0.9)
Total Bilirubin: 1.5 mg/dL — ABNORMAL HIGH (ref 0.3–1.2)
Total Protein: 6.4 g/dL (ref 6.0–8.3)

## 2012-10-25 LAB — PHOSPHORUS: Phosphorus: 2.9 mg/dL (ref 2.3–4.6)

## 2012-10-25 LAB — TESTOSTERONE: Testosterone: 482 ng/dL (ref 300–890)

## 2012-10-25 LAB — HEMOGLOBIN A1C
Hgb A1c MFr Bld: 6 % — ABNORMAL HIGH (ref <5.7)
Mean Plasma Glucose: 126 mg/dL — ABNORMAL HIGH (ref <117)

## 2012-10-25 LAB — TSH: TSH: 2.688 u[IU]/mL (ref 0.350–4.500)

## 2012-10-25 MED ORDER — TESTOSTERONE CYPIONATE 200 MG/ML IM SOLN: 200.0000 mg | INTRAMUSCULAR | Status: DC

## 2012-10-25 MED ORDER — TESTOSTERONE CYPIONATE 200 MG/ML IM SOLN
200.0000 mg | INTRAMUSCULAR | Status: DC
  Administered 2012-10-25: 200 mg via INTRAMUSCULAR

## 2012-10-25 MED ORDER — TESTOSTERONE CYPIONATE 200 MG/ML IM SOLN: 100.0000 mg | INTRAMUSCULAR | Status: DC

## 2012-10-25 NOTE — Patient Instructions (Addendum)
Labs prior to visit, lipid, renal, cbc, tsh, hepatic, testosterone, hgba1c  Call insurance about Zostavax and ask where is it cheaper and do they cover it at my office vs the pharmacyhtn

## 2012-10-27 ENCOUNTER — Encounter: Payer: Self-pay | Admitting: Family Medicine

## 2012-10-27 DIAGNOSIS — Z860100 Personal history of colon polyps, unspecified: Secondary | ICD-10-CM

## 2012-10-27 HISTORY — DX: Personal history of colonic polyps: Z86.010

## 2012-10-27 HISTORY — DX: Personal history of colon polyps, unspecified: Z86.0100

## 2012-10-27 NOTE — Assessment & Plan Note (Signed)
Well treated at this time. Fells much better, fatigue improved.

## 2012-10-27 NOTE — Assessment & Plan Note (Signed)
Encouraged Flonase and antihistamines dadily

## 2012-10-27 NOTE — Assessment & Plan Note (Signed)
Tolerating Atorvastatin, numbers well controlled, no change, avoid trans fats.

## 2012-10-27 NOTE — Assessment & Plan Note (Signed)
Well controlled, no change to meds 

## 2012-10-27 NOTE — Assessment & Plan Note (Signed)
No recent flares 

## 2012-10-27 NOTE — Progress Notes (Signed)
[name], male   DOB: 03/25/1946, 67 y.o.   MRN: [id number] Luis Brown [id number] [birth year] 10/27/2012      Progress Note-Follow Up  Subjective  Chief Complaint  Chief Complaint  Patient presents with  . Annual Exam    physical w/ labs    HPI  Patient is a [age]-year-old Caucasian male who is in today for follow up annual exam. He feels well. Has not had any recent illness. He denies any fevers or chills. Take his medications as prescribed. Is also mild trouble with allergies and congestion. No sore throat or ear pain. No chest pain, palpitations, shortness of breath, GI or GU complaints. He report Effexor and he feels wells his anxiety and depression are well-controlled on  Past Medical History  Diagnosis Date  . Chicken pox as a child  . Measles as a child  . Allergy     grass and pollen  . DDD (degenerative disc disease), lumbosacral   . DDD (degenerative disc disease)     cervical responds to steroid injections and low back required surgery  . Elevated BP   . Asthma     exercise induced  . Diabetes mellitus     pre  . Hyperlipidemia   . Overweight   . Insomnia   . Fatigue   . Preventative health care   . Anxiety and depression 10/25/2011  . BPH (benign prostatic hyperplasia) 04/23/2012  . ED (erectile dysfunction) 04/23/2012  . HTN (hypertension)   . Hyperglycemia     preDM   . 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  . Personal history of colonic polyps 10/27/2012    Follows with [facility]    Past Surgical History  Procedure Laterality Date  . Back surgery  2012 and 1994    Dr Charlesetta Garibaldi, screws and cage in low back  . Torn rotator cuff  2010    right  . Tonsillectomy      Family History  Problem Relation Age of Onset  . Hypertension Mother   . Diabetes Mother     type 2  . Cancer Mother 62    breast in remission  . Emphysema Father     smoker  . COPD Father     smoker  . Stroke Father 70    mini  . Heart disease Father    . Hypertension Sister   . Hyperlipidemia Sister   . Scoliosis Sister   . Osteoporosis Sister   . Hypertension Maternal Grandmother   . Scoliosis Maternal Grandmother   . Heart disease Maternal Grandfather   . Heart disease Paternal Grandfather     smoker    History   Social History  . Marital Status: Married    Spouse Name: N/A    Number of Children: N/A  . Years of Education: N/A   Occupational History  . Not on file.   Social History Main Topics  . Smoking status: Former Smoker -- 1.00 packs/day for 20 years    Types: Cigarettes    Quit date: 07/18/1989  . Smokeless tobacco: Never Used     Comment: off and on smoking for the 20 yrs  . Alcohol Use: Yes     Comment: drink or two a day- mixed drink  . Drug Use: No  . Sexually Active: Yes   Other Topics Concern  . Not on file   Social History Narrative  . No narrative on file    Current Outpatient Prescriptions on File Prior  to Visit  Medication Sig Dispense Refill  . albuterol (PROVENTIL HFA;VENTOLIN HFA) 108 (90 BASE) MCG/ACT inhaler Inhale 2 puffs into the lungs every 6 (six) hours as needed for wheezing. Try 1-2 puffs prior to exercise  1 Inhaler    . aspirin 81 MG tablet Take 81 mg by mouth daily.      Marland Kitchen atorvastatin (LIPITOR) 20 MG tablet Take 0.5 tablets (10 mg total) by mouth daily.  45 tablet  1  . b complex vitamins tablet Take 1 tablet by mouth daily.      . celecoxib (CELEBREX) 200 MG capsule Take 200 mg by mouth daily.       . cetirizine (ZYRTEC) 10 MG tablet Take 1 tablet (10 mg total) by mouth daily as needed for allergies or rhinitis.  30 tablet  11  . diazepam (VALIUM) 10 MG tablet TAKE 1/2-1 TABLET BY MOUTH EVERY NIGHT AS NEEDED  30 tablet  1  . ECHINACEA PLUS CAPS Take 1 capsule by mouth as needed. 750 mg      . fluticasone (FLONASE) 50 MCG/ACT nasal spray Place 2 sprays into the nose daily.  48 g  1  . glucose blood (ONE TOUCH ULTRA TEST) test strip Check daily  32 each  1  . lisinopril  (PRINIVIL,ZESTRIL) 5 MG tablet Take 1 tablet (5 mg total) by mouth daily.  30 tablet  5  . metFORMIN (GLUCOPHAGE) 500 MG tablet Take 1 tablet (500 mg total) by mouth daily.  30 tablet  2  . Multiple Vitamin (MULTIVITAMIN) tablet Take 1 tablet by mouth daily.      . Tamsulosin HCl (FLOMAX) 0.4 MG CAPS Take 1 capsule (0.4 mg total) by mouth daily.  90 capsule  1  . testosterone cypionate (DEPOTESTOTERONE CYPIONATE) 200 MG/ML injection Inject 1 mL (200 mg total) into the muscle every 14 (fourteen) days.  10 mL  0  . traMADol (ULTRAM) 50 MG tablet Take 50 mg by mouth 4 (four) times daily.      Marland Kitchen venlafaxine (EFFEXOR) 75 MG tablet Take 1 tablet (75 mg total) by mouth daily.  90 tablet  1   No current facility-administered medications on file prior to visit.    No Known Allergies  Review of Systems Review of Systems  Constitutional: Negative for fever, chills and malaise/fatigue.  HENT: Negative for hearing loss, nosebleeds and congestion.   Eyes: Negative for discharge.  Respiratory: Negative for cough, sputum production, shortness of breath and wheezing.   Cardiovascular: Negative for chest pain, palpitations and leg swelling.  Gastrointestinal: Negative for heartburn, nausea, vomiting, abdominal pain, diarrhea, constipation and blood in stool.  Genitourinary: Negative for dysuria, urgency, frequency and hematuria.  Musculoskeletal: Negative for myalgias, back pain and falls.  Skin: Negative for rash.  Neurological: Negative for dizziness, tremors, sensory change, focal weakness, loss of consciousness, weakness and headaches.  Endo/Heme/Allergies: Negative for polydipsia. Does not bruise/bleed easily.  Psychiatric/Behavioral: Negative for depression and suicidal ideas. The patient is not nervous/anxious and does not have insomnia.     Objective  BP 120/78  Pulse 96  Temp(Src) 98 F (36.7 C) (Oral)  Ht 5' 9.5" (1.765 m)  Wt 194 lb 0.6 oz (88.016 kg)  BMI 28.25 kg/m2  SpO2  96%  Physical Exam  Physical Exam  Constitutional: He is oriented to person, place, and time and well-developed, well-nourished, and in no distress. No distress.  HENT:  Head: Normocephalic and atraumatic.  Eyes: Conjunctivae are normal.  Neck: Neck supple. No thyromegaly  present.  Cardiovascular: Normal rate, regular rhythm and normal heart sounds.   No murmur heard. Pulmonary/Chest: Effort normal and breath sounds normal. No respiratory distress.  Abdominal: He exhibits no distension and no mass. There is no tenderness.  Musculoskeletal: He exhibits no edema.  Neurological: He is alert and oriented to person, place, and time.  Skin: Skin is warm.  Psychiatric: Memory, affect and judgment normal.    Lab Results  Component Value Date   TSH 2.688 10/25/2012   Lab Results  Component Value Date   WBC 6.6 10/25/2012   HGB 16.5 10/25/2012   HCT 46.7 10/25/2012   MCV 94.2 10/25/2012   PLT 302 10/25/2012   Lab Results  Component Value Date   CREATININE 0.96 10/25/2012   BUN 18 10/25/2012   NA 138 10/25/2012   K 4.9 10/25/2012   CL 102 10/25/2012   CO2 29 10/25/2012   Lab Results  Component Value Date   ALT 12 10/25/2012   AST 24 10/25/2012   ALKPHOS 69 10/25/2012   BILITOT 1.5* 10/25/2012   Lab Results  Component Value Date   CHOL 147 10/25/2012   Lab Results  Component Value Date   HDL 63 10/25/2012   Lab Results  Component Value Date   LDLCALC 71 10/25/2012   Lab Results  Component Value Date   TRIG 67 10/25/2012   Lab Results  Component Value Date   CHOLHDL 2.3 10/25/2012     Assessment & Plan  HTN (hypertension) Well controlled, no change to meds  Hyperlipidemia Tolerating Atorvastatin, numbers well controlled, no change, avoid trans fats.  Testosterone deficiency Well treated at this time. Fells much better, fatigue improved.   Allergic state Encouraged Flonase and antihistamines dadily  Preventative health care Doing well, very active, encouraged heart  healthy diet and regular exercise  Asthma No recent flares.

## 2012-10-27 NOTE — Assessment & Plan Note (Signed)
>>  ASSESSMENT AND PLAN FOR TESTOSTERONE DEFICIENCY WRITTEN ON 10/27/2012  1:54 PM BY BLYTH, STACEY A, MD  Well treated at this time. Fells much better, fatigue improved.

## 2012-10-27 NOTE — Assessment & Plan Note (Signed)
Doing well, very active, encouraged heart healthy diet and regular exercise

## 2012-11-14 ENCOUNTER — Telehealth: Payer: Self-pay

## 2012-11-14 ENCOUNTER — Ambulatory Visit (INDEPENDENT_AMBULATORY_CARE_PROVIDER_SITE_OTHER): Payer: Medicare Other

## 2012-11-14 DIAGNOSIS — E291 Testicular hypofunction: Secondary | ICD-10-CM

## 2012-11-14 DIAGNOSIS — E349 Endocrine disorder, unspecified: Secondary | ICD-10-CM

## 2012-11-14 MED ORDER — TESTOSTERONE CYPIONATE 200 MG/ML IM SOLN
200.0000 mg | INTRAMUSCULAR | Status: DC
  Administered 2012-11-14: 200 mg via INTRAMUSCULAR

## 2012-11-14 NOTE — Progress Notes (Signed)
  Subjective:    [name], male    DOB: 03/16/1946, 67 y.o.   MRN: [id number]  HPI    Review of Systems     Objective:   Physical Exam        Assessment & Plan:  Patient came in for his testosterone injection. Pt tolerated injection well

## 2012-11-14 NOTE — Telephone Encounter (Signed)
He can have Viagra 100 mg tabs 1/2 to 1 tab po daily prn ED, disp #10, 2 rf. Warn him if he has chest pain people need to know he took it so they do not give him Nitroglycerin. If he has CP taking it he should not take it again

## 2012-11-14 NOTE — Telephone Encounter (Signed)
Pt came in today for a testosterone injection and stated that he would like an RX for Viagra. Pt stated that he wants one printed and he will pick it up so he can check on different prices. Please advise?

## 2012-11-15 MED ORDER — SILDENAFIL CITRATE 100 MG PO TABS: ORAL_TABLET | ORAL | Status: DC

## 2012-11-15 NOTE — Telephone Encounter (Signed)
RX printed and put at front desk. Pt informed and he voiced understanding.

## 2012-11-22 ENCOUNTER — Telehealth: Payer: Self-pay | Admitting: Family Medicine

## 2012-11-22 MED ORDER — METFORMIN HCL 500 MG PO TABS: 500.0000 mg | ORAL_TABLET | Freq: Every day | ORAL | Status: DC

## 2012-11-22 NOTE — Telephone Encounter (Signed)
Refill- metformin hcl 500mg  tablet. Take one tablet twice daily. Qty 60 last fill 3.5.14

## 2012-11-22 NOTE — Telephone Encounter (Signed)
Rx sent in to pharmacy. 

## 2012-12-24 ENCOUNTER — Other Ambulatory Visit: Payer: Self-pay | Admitting: Family Medicine

## 2013-01-30 ENCOUNTER — Ambulatory Visit (INDEPENDENT_AMBULATORY_CARE_PROVIDER_SITE_OTHER): Payer: Medicare Other

## 2013-01-30 ENCOUNTER — Telehealth: Payer: Self-pay

## 2013-01-30 DIAGNOSIS — R739 Hyperglycemia, unspecified: Secondary | ICD-10-CM

## 2013-01-30 DIAGNOSIS — R7989 Other specified abnormal findings of blood chemistry: Secondary | ICD-10-CM

## 2013-01-30 DIAGNOSIS — N400 Enlarged prostate without lower urinary tract symptoms: Secondary | ICD-10-CM

## 2013-01-30 DIAGNOSIS — E349 Endocrine disorder, unspecified: Secondary | ICD-10-CM

## 2013-01-30 DIAGNOSIS — I10 Essential (primary) hypertension: Secondary | ICD-10-CM

## 2013-01-30 DIAGNOSIS — E785 Hyperlipidemia, unspecified: Secondary | ICD-10-CM

## 2013-01-30 DIAGNOSIS — R5383 Other fatigue: Secondary | ICD-10-CM

## 2013-01-30 DIAGNOSIS — E291 Testicular hypofunction: Secondary | ICD-10-CM

## 2013-01-30 MED ORDER — TESTOSTERONE CYPIONATE 200 MG/ML IM SOLN
200.0000 mg | INTRAMUSCULAR | Status: DC
  Administered 2013-01-30: 200 mg via INTRAMUSCULAR

## 2013-01-30 NOTE — Telephone Encounter (Signed)
Pt left a message stating he needed an appt for his testosterone injection.  I called pt back and left him a message stating to call back and schedule a nurse visit

## 2013-01-30 NOTE — Progress Notes (Signed)
  Subjective:    [name], male    DOB: Apr 08, 1946, 67 y.o.   MRN: [id number]  HPI    Review of Systems     Objective:   Physical Exam        Assessment & Plan:   Patient came in today for his testosterone injection. Pt tolerated injection well

## 2013-01-30 NOTE — Telephone Encounter (Signed)
Patient came in today for his testosterone injection and states his last RX for Viagra was going to be $300 so he didn't pick up RX. Pt states the pharmacy kept his RX.  Pt would like to have a printed RX of Viagra so he can check out different prices.   Please advise?

## 2013-01-31 ENCOUNTER — Other Ambulatory Visit: Payer: Self-pay | Admitting: Family Medicine

## 2013-02-06 ENCOUNTER — Other Ambulatory Visit: Payer: Self-pay | Admitting: Family Medicine

## 2013-02-11 ENCOUNTER — Telehealth: Payer: Self-pay | Admitting: *Deleted

## 2013-02-11 ENCOUNTER — Other Ambulatory Visit: Payer: Self-pay | Admitting: Family Medicine

## 2013-02-11 NOTE — Telephone Encounter (Signed)
OK to refill Diazepam, same sig, same number 1 refill

## 2013-02-11 NOTE — Telephone Encounter (Signed)
Faxed refill request received from pharmacy for Diazepam Last filled by MD on 03.24.14, #30x1 Last AEX - 04.10.14 Next AEX - 6 Months Please advise on refills/SLS

## 2013-02-12 MED ORDER — DIAZEPAM 10 MG PO TABS: ORAL_TABLET | ORAL | Status: DC

## 2013-02-12 NOTE — Telephone Encounter (Signed)
Please advise refill? 

## 2013-02-12 NOTE — Telephone Encounter (Signed)
Please advise 

## 2013-02-26 ENCOUNTER — Telehealth: Payer: Self-pay | Admitting: Family Medicine

## 2013-02-26 MED ORDER — METFORMIN HCL 500 MG PO TABS: 500.0000 mg | ORAL_TABLET | Freq: Every day | ORAL | Status: DC

## 2013-02-26 MED ORDER — TAMSULOSIN HCL 0.4 MG PO CAPS: 0.4000 mg | ORAL_CAPSULE | Freq: Every day | ORAL | Status: DC

## 2013-02-26 NOTE — Telephone Encounter (Signed)
Refill- tamsulosin hcl 0.4mg  capsule. Take one capsule every day. Qty 90 last fill 5.12.14  Refill- metformin hcl 500mg  tablet. Take one tablet (500mg  total) by mouth daily. Qty 30 last fill 6.30.14

## 2013-03-19 ENCOUNTER — Other Ambulatory Visit: Payer: Self-pay | Admitting: Family Medicine

## 2013-03-19 NOTE — Telephone Encounter (Signed)
Please advise refill? Last RX was done on 07-24-12 quantity  10 ml with 0 refills  If ok fax to [phone]

## 2013-03-20 NOTE — Telephone Encounter (Signed)
Faxed to pharmacy

## 2013-03-24 ENCOUNTER — Other Ambulatory Visit: Payer: Self-pay | Admitting: Family Medicine

## 2013-03-25 NOTE — Telephone Encounter (Signed)
RX reprinted and resent

## 2013-04-22 ENCOUNTER — Ambulatory Visit: Payer: Medicare Other | Admitting: Family Medicine

## 2013-04-30 ENCOUNTER — Ambulatory Visit: Payer: Medicare Other | Admitting: Family Medicine

## 2013-05-06 ENCOUNTER — Other Ambulatory Visit: Payer: Self-pay | Admitting: Family Medicine

## 2013-05-06 ENCOUNTER — Ambulatory Visit (INDEPENDENT_AMBULATORY_CARE_PROVIDER_SITE_OTHER): Payer: Medicare Other | Admitting: Family Medicine

## 2013-05-06 ENCOUNTER — Encounter: Payer: Self-pay | Admitting: Family Medicine

## 2013-05-06 VITALS — BP 102/68 | HR 60 | Temp 97.8°F | Ht 69.5 in | Wt 195.1 lb

## 2013-05-06 DIAGNOSIS — E119 Type 2 diabetes mellitus without complications: Secondary | ICD-10-CM

## 2013-05-06 DIAGNOSIS — Z23 Encounter for immunization: Secondary | ICD-10-CM

## 2013-05-06 DIAGNOSIS — T7840XD Allergy, unspecified, subsequent encounter: Secondary | ICD-10-CM

## 2013-05-06 DIAGNOSIS — E291 Testicular hypofunction: Secondary | ICD-10-CM

## 2013-05-06 DIAGNOSIS — N529 Male erectile dysfunction, unspecified: Secondary | ICD-10-CM

## 2013-05-06 DIAGNOSIS — R7989 Other specified abnormal findings of blood chemistry: Secondary | ICD-10-CM

## 2013-05-06 DIAGNOSIS — R739 Hyperglycemia, unspecified: Secondary | ICD-10-CM

## 2013-05-06 DIAGNOSIS — R7309 Other abnormal glucose: Secondary | ICD-10-CM

## 2013-05-06 DIAGNOSIS — M51379 Other intervertebral disc degeneration, lumbosacral region without mention of lumbar back pain or lower extremity pain: Secondary | ICD-10-CM

## 2013-05-06 DIAGNOSIS — M5137 Other intervertebral disc degeneration, lumbosacral region: Secondary | ICD-10-CM

## 2013-05-06 DIAGNOSIS — E785 Hyperlipidemia, unspecified: Secondary | ICD-10-CM

## 2013-05-06 DIAGNOSIS — N400 Enlarged prostate without lower urinary tract symptoms: Secondary | ICD-10-CM

## 2013-05-06 DIAGNOSIS — Z5189 Encounter for other specified aftercare: Secondary | ICD-10-CM

## 2013-05-06 DIAGNOSIS — I10 Essential (primary) hypertension: Secondary | ICD-10-CM

## 2013-05-06 LAB — HEPATIC FUNCTION PANEL
ALT: 15 U/L (ref 0–53)
AST: 25 U/L (ref 0–37)
Albumin: 4.3 g/dL (ref 3.5–5.2)
Alkaline Phosphatase: 64 U/L (ref 39–117)
Bilirubin, Direct: 0.2 mg/dL (ref 0.0–0.3)
Indirect Bilirubin: 0.7 mg/dL (ref 0.0–0.9)
Total Bilirubin: 0.9 mg/dL (ref 0.3–1.2)
Total Protein: 6.3 g/dL (ref 6.0–8.3)

## 2013-05-06 LAB — CBC
HCT: 46.6 % (ref 39.0–52.0)
Hemoglobin: 16.4 g/dL (ref 13.0–17.0)
MCH: 33.5 pg (ref 26.0–34.0)
MCHC: 35.2 g/dL (ref 30.0–36.0)
MCV: 95.3 fL (ref 78.0–100.0)
Platelets: 322 10*3/uL (ref 150–400)
RBC: 4.89 MIL/uL (ref 4.22–5.81)
RDW: 13.1 % (ref 11.5–15.5)
WBC: 6.5 10*3/uL (ref 4.0–10.5)

## 2013-05-06 LAB — RENAL FUNCTION PANEL
Albumin: 4.3 g/dL (ref 3.5–5.2)
BUN: 18 mg/dL (ref 6–23)
CO2: 30 mEq/L (ref 19–32)
Calcium: 9.1 mg/dL (ref 8.4–10.5)
Chloride: 102 mEq/L (ref 96–112)
Creat: 0.96 mg/dL (ref 0.50–1.35)
Glucose, Bld: 122 mg/dL — ABNORMAL HIGH (ref 70–99)
Phosphorus: 2.2 mg/dL — ABNORMAL LOW (ref 2.3–4.6)
Potassium: 5 mEq/L (ref 3.5–5.3)
Sodium: 138 mEq/L (ref 135–145)

## 2013-05-06 LAB — LIPID PANEL
Cholesterol: 124 mg/dL (ref 0–200)
HDL: 63 mg/dL (ref >39)
LDL Cholesterol: 50 mg/dL (ref 0–99)
Total CHOL/HDL Ratio: 2 Ratio
Triglycerides: 56 mg/dL (ref <150)
VLDL: 11 mg/dL (ref 0–40)

## 2013-05-06 LAB — TSH: TSH: 3.447 u[IU]/mL (ref 0.350–4.500)

## 2013-05-06 LAB — HEMOGLOBIN A1C
Hgb A1c MFr Bld: 6.4 % — ABNORMAL HIGH (ref <5.7)
Mean Plasma Glucose: 137 mg/dL — ABNORMAL HIGH (ref <117)

## 2013-05-06 LAB — PSA: PSA: 0.31 ng/mL (ref <=4.00)

## 2013-05-06 MED ORDER — SILDENAFIL CITRATE 100 MG PO TABS: ORAL_TABLET | ORAL | Status: DC

## 2013-05-06 MED ORDER — TAMSULOSIN HCL 0.4 MG PO CAPS: 0.8000 mg | ORAL_CAPSULE | Freq: Every day | ORAL | Status: DC

## 2013-05-06 NOTE — Assessment & Plan Note (Signed)
Struggles with chronic pain manages well. Is seeing Dr. Charlesetta Garibaldi and recently had epidural injection with only minimal improvement. Will followup for further care, pain is manageable at this time he

## 2013-05-06 NOTE — Assessment & Plan Note (Signed)
Did not pick up a prescription of Viagra due to cost but would like to have a paper copy to take to various pharmacies to check prices. Is printed today

## 2013-05-06 NOTE — Progress Notes (Signed)
[name], male   DOB: 10-07-1945, 67 y.o.   MRN: [id number] Luis Brown [id number] 1945-10-17 05/06/2013      Progress Note-Follow Up  Subjective  Chief Complaint  Chief Complaint  Patient presents with  . Follow-up    6 month  . Injections    flu- high dose    HPI  Patient is a 67 year old Caucasian male who is in today for routine followup. Overall he is doing well. No recent illness. Continues to struggle with low back pain and is following closely with subspecialty care. Had an epidural last week which was marginally helpful. Denies any increased incontinence. Does have radicular symptoms at times but these are manageable. He was noting some increasing urinary hesitancy over the last several months. Denies any other recent illness. No chest pain, palpitations, dysuria, fevers, abdominal pain. No shortness of breath headaches or other concerns noted today. Agrees to flu shot today  Past Medical History  Diagnosis Date  . Chicken pox as a child  . Measles as a child  . Allergy     grass and pollen  . DDD (degenerative disc disease), lumbosacral   . DDD (degenerative disc disease)     cervical responds to steroid injections and low back required surgery  . Elevated BP   . Asthma     exercise induced  . Diabetes mellitus     pre  . Hyperlipidemia   . Overweight(278.02)   . Insomnia   . Fatigue   . Preventative health care   . Anxiety and depression 10/25/2011  . BPH (benign prostatic hyperplasia) 04/23/2012  . ED (erectile dysfunction) 04/23/2012  . HTN (hypertension)   . Hyperglycemia     preDM   . 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  . Personal history of colonic polyps 10/27/2012    Follows with [facility]    Past Surgical History  Procedure Laterality Date  . Back surgery  2012 and 1994    Dr Charlesetta Garibaldi, screws and cage in low back  . Torn rotator cuff  2010    right  . Tonsillectomy      Family History  Problem Relation Age of  Onset  . Hypertension Mother   . Diabetes Mother     type 2  . Cancer Mother 25    breast in remission  . Emphysema Father     smoker  . COPD Father     smoker  . Stroke Father 71    mini  . Heart disease Father   . Hypertension Sister   . Hyperlipidemia Sister   . Scoliosis Sister   . Osteoporosis Sister   . Hypertension Maternal Grandmother   . Scoliosis Maternal Grandmother   . Heart disease Maternal Grandfather   . Heart disease Paternal Grandfather     smoker    History   Social History  . Marital Status: Married    Spouse Name: N/A    Number of Children: N/A  . Years of Education: N/A   Occupational History  . Not on file.   Social History Main Topics  . Smoking status: Former Smoker -- 1.00 packs/day for 20 years    Types: Cigarettes    Quit date: 07/18/1989  . Smokeless tobacco: Never Used     Comment: off and on smoking for the 20 yrs  . Alcohol Use: Yes     Comment: drink or two a day- mixed drink  . Drug Use: No  . Sexual Activity: Yes  Other Topics Concern  . Not on file   Social History Narrative  . No narrative on file    Current Outpatient Prescriptions on File Prior to Visit  Medication Sig Dispense Refill  . albuterol (PROVENTIL HFA;VENTOLIN HFA) 108 (90 BASE) MCG/ACT inhaler Inhale 2 puffs into the lungs every 6 (six) hours as needed for wheezing. Try 1-2 puffs prior to exercise  1 Inhaler    . aspirin 81 MG tablet Take 81 mg by mouth daily.      Marland Kitchen atorvastatin (LIPITOR) 20 MG tablet TAKE 1/2 TABLET EVERY DAY  45 tablet  1  . b complex vitamins tablet Take 1 tablet by mouth daily.      . celecoxib (CELEBREX) 200 MG capsule Take 200 mg by mouth daily.       . diazepam (VALIUM) 10 MG tablet Take 1/2-1 tablet po qhs prn  30 tablet  1  . ECHINACEA PLUS CAPS Take 1 capsule by mouth as needed. 750 mg      . glucose blood (ONE TOUCH ULTRA TEST) test strip Check daily  32 each  1  . lisinopril (PRINIVIL,ZESTRIL) 5 MG tablet TAKE 1 TABLET BY  MOUTH EVERY DAY  30 tablet  5  . metFORMIN (GLUCOPHAGE) 500 MG tablet Take 1 tablet (500 mg total) by mouth daily.  30 tablet  2  . Multiple Vitamin (MULTIVITAMIN) tablet Take 1 tablet by mouth daily.      Marland Kitchen testosterone cypionate (DEPOTESTOTERONE CYPIONATE) 200 MG/ML injection INJECT INTO THE MUSCLE EVERY 14 DAYS  10 mL  0  . traMADol (ULTRAM) 50 MG tablet Take 50 mg by mouth 4 (four) times daily.      Marland Kitchen venlafaxine (EFFEXOR) 75 MG tablet TAKE ONE TABLET BY MOUTH DAILY  90 tablet  1  . acyclovir (ZOVIRAX) 200 MG capsule TAKE ONE CAPSULE BY MOUTH 5 TIMES A DAY  25 capsule  1  . cetirizine (ZYRTEC) 10 MG tablet Take 1 tablet (10 mg total) by mouth daily as needed for allergies or rhinitis.  30 tablet  11  . fluticasone (FLONASE) 50 MCG/ACT nasal spray Place 2 sprays into the nose daily.  48 g  1   No current facility-administered medications on file prior to visit.    No Known Allergies  Review of Systems  Review of Systems  Constitutional: Negative for fever and malaise/fatigue.  HENT: Negative for congestion.   Eyes: Negative for discharge.  Respiratory: Negative for shortness of breath.   Cardiovascular: Negative for chest pain, palpitations and leg swelling.  Gastrointestinal: Negative for nausea, abdominal pain and diarrhea.  Genitourinary: Negative for dysuria.  Musculoskeletal: Positive for back pain. Negative for falls.  Skin: Negative for rash.  Neurological: Negative for loss of consciousness and headaches.  Endo/Heme/Allergies: Negative for polydipsia.  Psychiatric/Behavioral: Negative for depression and suicidal ideas. The patient is not nervous/anxious and does not have insomnia.     Objective  BP 102/68  Pulse 60  Temp(Src) 97.8 F (36.6 C) (Oral)  Ht 5' 9.5" (1.765 m)  Wt 195 lb 1.3 oz (88.488 kg)  BMI 28.41 kg/m2  SpO2 97%  Physical Exam  Physical Exam  Constitutional: He is oriented to person, place, and time and well-developed, well-nourished, and in  no distress. No distress.  HENT:  Head: Normocephalic and atraumatic.  Eyes: Conjunctivae are normal.  Neck: Neck supple. No thyromegaly present.  Cardiovascular: Normal rate, regular rhythm and normal heart sounds.   No murmur heard. Pulmonary/Chest: Effort normal and  breath sounds normal. No respiratory distress.  Abdominal: He exhibits no distension and no mass. There is no tenderness.  Musculoskeletal: He exhibits no edema.  Neurological: He is alert and oriented to person, place, and time.  Skin: Skin is warm.  Psychiatric: Memory, affect and judgment normal.    Lab Results  Component Value Date   TSH 2.688 10/25/2012   Lab Results  Component Value Date   WBC 6.6 10/25/2012   HGB 16.5 10/25/2012   HCT 46.7 10/25/2012   MCV 94.2 10/25/2012   PLT 302 10/25/2012   Lab Results  Component Value Date   CREATININE 0.96 10/25/2012   BUN 18 10/25/2012   NA 138 10/25/2012   K 4.9 10/25/2012   CL 102 10/25/2012   CO2 29 10/25/2012   Lab Results  Component Value Date   ALT 12 10/25/2012   AST 24 10/25/2012   ALKPHOS 69 10/25/2012   BILITOT 1.5* 10/25/2012   Lab Results  Component Value Date   CHOL 147 10/25/2012   Lab Results  Component Value Date   HDL 63 10/25/2012   Lab Results  Component Value Date   LDLCALC 71 10/25/2012   Lab Results  Component Value Date   TRIG 67 10/25/2012   Lab Results  Component Value Date   CHOLHDL 2.3 10/25/2012     Assessment & Plan  ED (erectile dysfunction) Did not pick up a prescription of Viagra due to cost but would like to have a paper copy to take to various pharmacies to check prices. Is printed today   Allergic state No recent flares.  BPH (benign prostatic hyperplasia) Patient noting mild increase in hesitancy. Will try increasing the Flomax to 0.8 mg. Warned regarding increasing side effects. He will cut back to 1 tab if he has any episodes of lightheadedness or concerns.  Hyperglycemia Patient reporting blood sugars in the  170 range but always postprandial. Encouraged to start taking them prior to breakfast a couple times a week and after meals a couple times a week. Also check as needed. Check hemoglobin A1c today and minimize simple carbs Requested copy of last eye exam at today's visit  Hyperlipidemia Tolerating Lipitor with good response repeat lipid panel today  HTN (hypertension) Well controlled no changes  DDD (degenerative disc disease), lumbosacral Struggles with chronic pain manages well. Is seeing Dr. Charlesetta Garibaldi and recently had epidural injection with only minimal improvement. Will followup for further care, pain is manageable at this time he

## 2013-05-06 NOTE — Assessment & Plan Note (Signed)
No recent flares 

## 2013-05-06 NOTE — Assessment & Plan Note (Signed)
Well controlled no changes 

## 2013-05-06 NOTE — Patient Instructions (Signed)
Benign Prostatic Hyperplasia You have an enlarged prostate. This is common in elderly males. It is called BPH. This stands for benign prostate hyperplasia. The prostate gland is located in base of the bladder. When it grows, the prostate blocks the urethra. This is the tube which drains urine from the bladder.  SYMPTOMS  Weak urine stream.  Dribbling.  Feeling like the bladder has not emptied completely.  Difficulty starting urination.  Getting up frequently at night to urinate.  Urinating more frequently during the day. Complete urinary blockage or severe pain with urination requires immediate attention. DIAGNOSIS   Your caregiver often has a good idea what is wrong by taking a history and doing a physical exam.  Special x-rays may be done. TREATMENT   For mild problems, no treatment may be necessary.  If the problems are moderate, medications may provide relief. Some of these work by making the prostate gland smaller. The herb saw palmetto is commonly used.  If complete blockage occurs, a Foley catheter is usually left in place for a few days.  Surgery is often needed for more severe problems. TURP is the prostate surgery for BPH which is done through the urethra. TURP stands for transurethral resection of the prostate. It involves cutting away chips from the prostate. It is done by removing chips so that they can come out through the penis.  Techniques using heat, microwave and laser to remove the prostate blockage are also being used. HOME CARE INSTRUCTIONS   Give yourself time when you urinate.  Stay away from alcohol.  Beverages containing caffeine such as coffee, tea and colas can make the problems worse.  Decongestants, antihistamines, and some prescription medicines can also make the problem worse.  Follow up with your caregiver for further treatment as recommended. SEEK IMMEDIATE MEDICAL CARE IF:   You develop increased pain with urination or are unable to pass  your water.  You develop severe abdominal pain, vomiting, a high fever, or fainting.  You develop back pain or blood in your urine. MAKE SURE YOU:   Understand these instructions.  Will watch your condition.  Will get help right away if you are not doing well or get worse. Document Released: 07/04/2005 Document Revised: 09/26/2011 Document Reviewed: 03/09/2007 [facility] Patient Information 2014 [place], [place].

## 2013-05-06 NOTE — Assessment & Plan Note (Signed)
Patient noting mild increase in hesitancy. Will try increasing the Flomax to 0.8 mg. Warned regarding increasing side effects. He will cut back to 1 tab if he has any episodes of lightheadedness or concerns.

## 2013-05-06 NOTE — Assessment & Plan Note (Signed)
Tolerating Lipitor with good response repeat lipid panel today

## 2013-05-06 NOTE — Assessment & Plan Note (Addendum)
Patient reporting blood sugars in the 170 range but always postprandial. Encouraged to start taking them prior to breakfast a couple times a week and after meals a couple times a week. Also check as needed. Check hemoglobin A1c today and minimize simple carbs Requested copy of last eye exam at today's visit

## 2013-05-07 LAB — TESTOSTERONE: Testosterone: 501 ng/dL (ref 300–890)

## 2013-05-13 ENCOUNTER — Encounter: Payer: Self-pay | Admitting: Family Medicine

## 2013-06-10 ENCOUNTER — Other Ambulatory Visit: Payer: Self-pay | Admitting: *Deleted

## 2013-06-10 MED ORDER — METFORMIN HCL 500 MG PO TABS: 500.0000 mg | ORAL_TABLET | Freq: Every day | ORAL | Status: DC

## 2013-06-10 NOTE — Telephone Encounter (Signed)
Rx request to pharmacy/SLS  

## 2013-06-17 ENCOUNTER — Telehealth: Payer: Self-pay | Admitting: *Deleted

## 2013-06-17 NOTE — Telephone Encounter (Signed)
OK to refill Diazepam with same strength same sig, #60 and 1 rf but will need an appt to get more

## 2013-06-17 NOTE — Telephone Encounter (Signed)
eScribe request for refill on Diazepam 10 mg Last filled - 07.29.14, #30x1 Last AEX - 10.29.14 Next AEX - 6 Months Please Advise/SLS

## 2013-06-18 MED ORDER — DIAZEPAM 10 MG PO TABS: ORAL_TABLET | ORAL | Status: DC

## 2013-06-18 NOTE — Telephone Encounter (Signed)
I sent in quantity 30 because that's what the sig was.  I also left a message on pts vm stating that pt would need an appt for addt refills

## 2013-07-24 ENCOUNTER — Telehealth: Payer: Self-pay | Admitting: Family Medicine

## 2013-07-24 MED ORDER — LISINOPRIL 5 MG PO TABS: ORAL_TABLET | ORAL | Status: DC

## 2013-07-24 NOTE — Telephone Encounter (Signed)
Refill sent.

## 2013-07-24 NOTE — Telephone Encounter (Signed)
refill-lisinopril 5mg  tab. Take one tablet by mouth every day. Qty 30 last fill 11.21.14

## 2013-08-20 ENCOUNTER — Other Ambulatory Visit: Payer: Self-pay | Admitting: Family Medicine

## 2013-08-21 ENCOUNTER — Telehealth: Payer: Self-pay | Admitting: Family Medicine

## 2013-08-21 MED ORDER — VENLAFAXINE HCL 75 MG PO TABS: 75.0000 mg | ORAL_TABLET | Freq: Every day | ORAL | Status: DC

## 2013-08-21 NOTE — Telephone Encounter (Signed)
refill-venlafaxine hcl 75mg  tablet

## 2013-09-05 ENCOUNTER — Encounter: Payer: Self-pay | Admitting: Family Medicine

## 2013-09-10 ENCOUNTER — Other Ambulatory Visit: Payer: Self-pay | Admitting: Family Medicine

## 2013-09-26 ENCOUNTER — Telehealth: Payer: Self-pay

## 2013-09-26 NOTE — Telephone Encounter (Signed)
Patient left a message stating that his testosterone needs a PA.  Please get this started for [place]

## 2013-09-30 ENCOUNTER — Other Ambulatory Visit: Payer: Self-pay

## 2013-09-30 MED ORDER — TESTOSTERONE CYPIONATE 200 MG/ML IM SOLN: 100.0000 mg | INTRAMUSCULAR | Status: DC

## 2013-09-30 NOTE — Telephone Encounter (Signed)
RX printed for md to sign and be faxed

## 2013-10-02 NOTE — Telephone Encounter (Signed)
PA form received, forward to nurse °

## 2013-10-08 NOTE — Telephone Encounter (Signed)
Need to know patients last testosterone date and dose

## 2013-10-08 NOTE — Telephone Encounter (Signed)
Left a message for patient to call us back with the information listed below

## 2013-10-10 NOTE — Telephone Encounter (Signed)
Pt was in today and stated that he takes 1 ml and his last dose was a week ago from today. PA was put in fax pile to be faxed

## 2013-10-11 ENCOUNTER — Encounter: Payer: Self-pay | Admitting: Family Medicine

## 2013-10-11 ENCOUNTER — Ambulatory Visit (INDEPENDENT_AMBULATORY_CARE_PROVIDER_SITE_OTHER): Payer: Medicare Other | Admitting: Family Medicine

## 2013-10-11 VITALS — BP 114/70 | HR 74 | Temp 97.9°F | Ht 69.5 in | Wt 201.1 lb

## 2013-10-11 DIAGNOSIS — F419 Anxiety disorder, unspecified: Secondary | ICD-10-CM

## 2013-10-11 DIAGNOSIS — F341 Dysthymic disorder: Secondary | ICD-10-CM

## 2013-10-11 DIAGNOSIS — R739 Hyperglycemia, unspecified: Secondary | ICD-10-CM

## 2013-10-11 DIAGNOSIS — I10 Essential (primary) hypertension: Secondary | ICD-10-CM

## 2013-10-11 DIAGNOSIS — F329 Major depressive disorder, single episode, unspecified: Secondary | ICD-10-CM

## 2013-10-11 DIAGNOSIS — G4700 Insomnia, unspecified: Secondary | ICD-10-CM

## 2013-10-11 DIAGNOSIS — R7309 Other abnormal glucose: Secondary | ICD-10-CM

## 2013-10-11 DIAGNOSIS — E663 Overweight: Secondary | ICD-10-CM

## 2013-10-11 DIAGNOSIS — E785 Hyperlipidemia, unspecified: Secondary | ICD-10-CM

## 2013-10-11 MED ORDER — METFORMIN HCL 500 MG PO TABS: 500.0000 mg | ORAL_TABLET | Freq: Every day | ORAL | Status: DC

## 2013-10-11 MED ORDER — ZOLPIDEM TARTRATE 5 MG PO TABS: 5.0000 mg | ORAL_TABLET | Freq: Every evening | ORAL | Status: DC | PRN

## 2013-10-11 MED ORDER — LISINOPRIL 5 MG PO TABS: ORAL_TABLET | ORAL | Status: DC

## 2013-10-11 NOTE — Progress Notes (Signed)
Pre visit review using our clinic review tool, if applicable. No additional management support is needed unless otherwise documented below in the visit note. 

## 2013-10-11 NOTE — Patient Instructions (Signed)

## 2013-10-13 NOTE — Assessment & Plan Note (Signed)
Tolerating statin, encouraged heart healthy diet, avoid trans fats, minimize simple carbs and saturated fats. Increase exercise as tolerated 

## 2013-10-13 NOTE — Assessment & Plan Note (Signed)
Well controlled, no changes to meds. Encouraged heart healthy diet such as the DASH diet and exercise as tolerated.  °

## 2013-10-13 NOTE — Assessment & Plan Note (Signed)
Good response to Effexor

## 2013-10-13 NOTE — Assessment & Plan Note (Signed)
hgba1c acceptable, minimize simple carbs. Increase exercise as tolerated. Continue current meds. Has cut down on alcohol significantly

## 2013-10-13 NOTE — Assessment & Plan Note (Signed)
Diazepam not helpful, given Ambien to try prn

## 2013-10-13 NOTE — Assessment & Plan Note (Signed)
Encouraged DASH diet, decrease po intake and increase exercise as tolerated. Needs 7-8 hours of sleep nightly. Avoid trans fats, eat small, frequent meals every 4-5 hours with lean proteins, complex carbs and healthy fats. Minimize simple carbs, GMO foods. 

## 2013-10-13 NOTE — Progress Notes (Signed)
[name], male   DOB: 18-Apr-1946, 68 y.o.   MRN: [id number] [place] RUMPLE [id number] 06/04/1946 10/13/2013      Progress Note-Follow Up  Subjective  Chief Complaint  Chief Complaint  Patient presents with  . Follow-up    medication refills    HPI  Patient is a 68 year old male in today for routine medical care. Feeling fairly well today. Believes his medications are helping except for his poor sleep. He notes diazepam was not very helpful with sleep. He has cut down his drinking hoping that will help. Otherwise he is doing well. He is complaining of paresthesias in his feet they've been present for 20 years. Left worse than right. No recent illness. Denies CP/palp/SOB/HA/congestion/fevers/GI or GU c/o. Taking meds as prescribed  Past Medical History  Diagnosis Date  . Chicken pox as a child  . Measles as a child  . Allergy     grass and pollen  . DDD (degenerative disc disease), lumbosacral   . DDD (degenerative disc disease)     cervical responds to steroid injections and low back required surgery  . Elevated BP   . Asthma     exercise induced  . Diabetes mellitus     pre  . Hyperlipidemia   . Overweight   . Insomnia   . Fatigue   . Preventative health care   . Anxiety and depression 10/25/2011  . BPH (benign prostatic hyperplasia) 04/23/2012  . ED (erectile dysfunction) 04/23/2012  . HTN (hypertension)   . Hyperglycemia     preDM   . 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  . Personal history of colonic polyps 10/27/2012    Follows with [facility]    Past Surgical History  Procedure Laterality Date  . Back surgery  2012 and 1994    Dr Margret Chance, screws and cage in low back  . Torn rotator cuff  2010    right  . Tonsillectomy      Family History  Problem Relation Age of Onset  . Hypertension Mother   . Diabetes Mother     type 2  . Cancer Mother 84    breast in remission  . Emphysema Father     smoker  . COPD Father     smoker  .  Stroke Father 59    mini  . Heart disease Father   . Hypertension Sister   . Hyperlipidemia Sister   . Scoliosis Sister   . Osteoporosis Sister   . Hypertension Maternal Grandmother   . Scoliosis Maternal Grandmother   . Heart disease Maternal Grandfather   . Heart disease Paternal Grandfather     smoker    History   Social History  . Marital Status: Married    Spouse Name: N/A    Number of Children: N/A  . Years of Education: N/A   Occupational History  . Not on file.   Social History Main Topics  . Smoking status: Former Smoker -- 1.00 packs/day for 20 years    Types: Cigarettes    Quit date: 07/18/1989  . Smokeless tobacco: Never Used     Comment: off and on smoking for the 20 yrs  . Alcohol Use: Yes     Comment: drink or two a day- mixed drink  . Drug Use: No  . Sexual Activity: Yes   Other Topics Concern  . Not on file   Social History Narrative  . No narrative on file    Current Outpatient  Prescriptions on File Prior to Visit  Medication Sig Dispense Refill  . albuterol (PROVENTIL HFA;VENTOLIN HFA) 108 (90 BASE) MCG/ACT inhaler Inhale 2 puffs into the lungs every 6 (six) hours as needed for wheezing. Try 1-2 puffs prior to exercise  1 Inhaler    . aspirin 81 MG tablet Take 81 mg by mouth daily.      Marland Kitchen atorvastatin (LIPITOR) 20 MG tablet TAKE 1/2 TABLET BY MOUTH EVERY DAY  45 tablet  1  . b complex vitamins tablet Take 1 tablet by mouth daily.      . celecoxib (CELEBREX) 200 MG capsule Take 200 mg by mouth daily.       . diazepam (VALIUM) 10 MG tablet Take 1/2-1 tablet po qhs prn  30 tablet  1  . ECHINACEA PLUS CAPS Take 1 capsule by mouth as needed. 750 mg      . glucose blood (ONE TOUCH ULTRA TEST) test strip Check daily  32 each  1  . Multiple Vitamin (MULTIVITAMIN) tablet Take 1 tablet by mouth daily.      . sildenafil (VIAGRA) 100 MG tablet Take 1/2 tab to 1 tab po daily prn ED  10 tablet  2  . tamsulosin (FLOMAX) 0.4 MG CAPS capsule Take 2 capsules  (0.8 mg total) by mouth daily.  180 capsule  2  . traMADol (ULTRAM) 50 MG tablet Take 50 mg by mouth 4 (four) times daily.      Marland Kitchen venlafaxine (EFFEXOR) 75 MG tablet Take 1 tablet (75 mg total) by mouth daily at 6 (six) AM.  90 tablet  1  . acyclovir (ZOVIRAX) 200 MG capsule TAKE ONE CAPSULE BY MOUTH 5 TIMES A DAY  25 capsule  1  . cetirizine (ZYRTEC) 10 MG tablet Take 1 tablet (10 mg total) by mouth daily as needed for allergies or rhinitis.  30 tablet  11  . fluticasone (FLONASE) 50 MCG/ACT nasal spray Place 2 sprays into the nose daily.  48 g  1   No current facility-administered medications on file prior to visit.    No Known Allergies  Review of Systems  Review of Systems  Constitutional: Negative for fever and malaise/fatigue.  HENT: Negative for congestion.   Eyes: Negative for discharge.  Respiratory: Negative for shortness of breath.   Cardiovascular: Negative for chest pain, palpitations and leg swelling.  Gastrointestinal: Negative for nausea, abdominal pain and diarrhea.  Genitourinary: Negative for dysuria.  Musculoskeletal: Negative for falls.  Skin: Negative for rash.  Neurological: Negative for loss of consciousness and headaches.  Endo/Heme/Allergies: Negative for polydipsia.  Psychiatric/Behavioral: Negative for depression and suicidal ideas. The patient is not nervous/anxious and does not have insomnia.     Objective  BP 114/70  Pulse 74  Temp(Src) 97.9 F (36.6 C) (Oral)  Ht 5' 9.5" (1.765 m)  Wt 201 lb 1.3 oz (91.209 kg)  BMI 29.28 kg/m2  SpO2 96%  Physical Exam  Physical Exam  Constitutional: He is oriented to person, place, and time and well-developed, well-nourished, and in no distress. No distress.  HENT:  Head: Normocephalic and atraumatic.  Eyes: Conjunctivae are normal.  Neck: Neck supple. No thyromegaly present.  Cardiovascular: Normal rate, regular rhythm and normal heart sounds.   No murmur heard. Pulmonary/Chest: Effort normal and  breath sounds normal. No respiratory distress.  Abdominal: He exhibits no distension and no mass. There is no tenderness.  Musculoskeletal: He exhibits no edema.  Neurological: He is alert and oriented to person, place, and time.  Skin: Skin is warm.  Psychiatric: Memory, affect and judgment normal.    Lab Results  Component Value Date   TSH 3.447 05/06/2013   Lab Results  Component Value Date   WBC 6.5 05/06/2013   HGB 16.4 05/06/2013   HCT 46.6 05/06/2013   MCV 95.3 05/06/2013   PLT 322 05/06/2013   Lab Results  Component Value Date   CREATININE 0.96 05/06/2013   BUN 18 05/06/2013   NA 138 05/06/2013   K 5.0 05/06/2013   CL 102 05/06/2013   CO2 30 05/06/2013   Lab Results  Component Value Date   ALT 15 05/06/2013   AST 25 05/06/2013   ALKPHOS 64 05/06/2013   BILITOT 0.9 05/06/2013   Lab Results  Component Value Date   CHOL 124 05/06/2013   Lab Results  Component Value Date   HDL 63 05/06/2013   Lab Results  Component Value Date   LDLCALC 50 05/06/2013   Lab Results  Component Value Date   TRIG 56 05/06/2013   Lab Results  Component Value Date   CHOLHDL 2.0 05/06/2013     Assessment & Plan  Insomnia Diazepam not helpful, given Ambien to try prn  HTN (hypertension) Well controlled, no changes to meds. Encouraged heart healthy diet such as the DASH diet and exercise as tolerated.   Hyperlipidemia Tolerating statin, encouraged heart healthy diet, avoid trans fats, minimize simple carbs and saturated fats. Increase exercise as tolerated  Overweight Encouraged DASH diet, decrease po intake and increase exercise as tolerated. Needs 7-8 hours of sleep nightly. Avoid trans fats, eat small, frequent meals every 4-5 hours with lean proteins, complex carbs and healthy fats. Minimize simple carbs, GMO foods.  Anxiety and depression Good response to Effexor  Hyperglycemia hgba1c acceptable, minimize simple carbs. Increase exercise as tolerated. Continue  current meds. Has cut down on alcohol significantly

## 2013-10-21 ENCOUNTER — Encounter: Payer: Self-pay | Admitting: Family Medicine

## 2013-10-21 ENCOUNTER — Ambulatory Visit (INDEPENDENT_AMBULATORY_CARE_PROVIDER_SITE_OTHER): Payer: Medicare Other | Admitting: Family Medicine

## 2013-10-21 VITALS — BP 124/72 | HR 84 | Temp 98.1°F | Resp 16 | Ht 69.5 in | Wt 193.0 lb

## 2013-10-21 DIAGNOSIS — J029 Acute pharyngitis, unspecified: Secondary | ICD-10-CM | POA: Insufficient documentation

## 2013-10-21 DIAGNOSIS — I10 Essential (primary) hypertension: Secondary | ICD-10-CM

## 2013-10-21 DIAGNOSIS — B019 Varicella without complication: Secondary | ICD-10-CM | POA: Insufficient documentation

## 2013-10-21 HISTORY — DX: Acute pharyngitis, unspecified: J02.9

## 2013-10-21 MED ORDER — AMOXICILLIN-POT CLAVULANATE 875-125 MG PO TABS: 1.0000 | ORAL_TABLET | Freq: Two times a day (BID) | ORAL | Status: DC

## 2013-10-21 NOTE — Patient Instructions (Signed)
Encouraged increased rest and hydration, add probiotics, zinc such as Coldeze or Xicam. Treat fevers as needed  Pharyngitis Pharyngitis is redness, pain, and swelling (inflammation) of your pharynx.  CAUSES  Pharyngitis is usually caused by infection. Most of the time, these infections are from viruses (viral) and are part of a cold. However, sometimes pharyngitis is caused by bacteria (bacterial). Pharyngitis can also be caused by allergies. Viral pharyngitis may be spread from person to person by coughing, sneezing, and personal items or utensils (cups, forks, spoons, toothbrushes). Bacterial pharyngitis may be spread from person to person by more intimate contact, such as kissing.  SIGNS AND SYMPTOMS  Symptoms of pharyngitis include:   Sore throat.   Tiredness (fatigue).   Low-grade fever.   Headache.  Joint pain and muscle aches.  Skin rashes.  Swollen lymph nodes.  Plaque-like film on throat or tonsils (often seen with bacterial pharyngitis). DIAGNOSIS  Your health care provider will ask you questions about your illness and your symptoms. Your medical history, along with a physical exam, is often all that is needed to diagnose pharyngitis. Sometimes, a rapid strep test is done. Other lab tests may also be done, depending on the suspected cause.  TREATMENT  Viral pharyngitis will usually get better in 3 4 days without the use of medicine. Bacterial pharyngitis is treated with medicines that kill germs (antibiotics).  HOME CARE INSTRUCTIONS   Drink enough water and fluids to keep your urine clear or pale yellow.   Only take over-the-counter or prescription medicines as directed by your health care provider:   If you are prescribed antibiotics, make sure you finish them even if you start to feel better.   Do not take aspirin.   Get lots of rest.   Gargle with 8 oz of salt water ( tsp of salt per 1 qt of water) as often as every 1 2 hours to soothe your throat.    Throat lozenges (if you are not at risk for choking) or sprays may be used to soothe your throat. SEEK MEDICAL CARE IF:   You have large, tender lumps in your neck.  You have a rash.  You cough up green, yellow-brown, or bloody spit. SEEK IMMEDIATE MEDICAL CARE IF:   Your neck becomes stiff.  You drool or are unable to swallow liquids.  You vomit or are unable to keep medicines or liquids down.  You have severe pain that does not go away with the use of recommended medicines.  You have trouble breathing (not caused by a stuffy nose). MAKE SURE YOU:   Understand these instructions.  Will watch your condition.  Will get help right away if you are not doing well or get worse. Document Released: 07/04/2005 Document Revised: 04/24/2013 Document Reviewed: 03/11/2013 [facility] Patient Information 2014 [place].

## 2013-10-21 NOTE — Assessment & Plan Note (Signed)
Encouraged increased rest and hydration, add probiotics, zinc such as Coldeze or Xicam. Treat fevers as needed. Recent strep contact, given symptoms will start Augmentin

## 2013-10-21 NOTE — Progress Notes (Signed)
[name], male   DOB: 28-Mar-1946, 68 y.o.   MRN: [id number] Luis Brown [id number] January 13, 1946 10/21/2013      Progress Note-Follow Up  Subjective  Chief Complaint  Chief Complaint  Patient presents with  . Sore Throat    friday  . Cough    HPI  Patient is a 68 year old male in today for routine medical care. He is here today with a 45 to history of sore throat. He's had some intermittent fevers and malaise. Has had significant head congestion with left-sided headache and ear pain. He feels slightly better today after taking a cough and cold preparation at bedtime and sleeping last night. No chest, there has been some chest tightness and congestion. No shortness of breath or palpitations. No GI GU complaints. He has had a recent strep exposure and does have a grandchild coming to stay with him later this week.  Past Medical History  Diagnosis Date  . Chicken pox as a child  . Measles as a child  . Allergy     grass and pollen  . DDD (degenerative disc disease), lumbosacral   . DDD (degenerative disc disease)     cervical responds to steroid injections and low back required surgery  . Elevated BP   . Asthma     exercise induced  . Diabetes mellitus     pre  . Hyperlipidemia   . Overweight   . Insomnia   . Fatigue   . Preventative health care   . Anxiety and depression 10/25/2011  . BPH (benign prostatic hyperplasia) 04/23/2012  . ED (erectile dysfunction) 04/23/2012  . HTN (hypertension)   . Hyperglycemia     preDM   . 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  . Personal history of colonic polyps 10/27/2012    Follows with [facility]    Past Surgical History  Procedure Laterality Date  . Back surgery  2012 and 1994    Dr Margret Chance, screws and cage in low back  . Torn rotator cuff  2010    right  . Tonsillectomy      Family History  Problem Relation Age of Onset  . Hypertension Mother   . Diabetes Mother     type 2  . Cancer Mother 56   breast in remission  . Emphysema Father     smoker  . COPD Father     smoker  . Stroke Father 55    mini  . Heart disease Father   . Hypertension Sister   . Hyperlipidemia Sister   . Scoliosis Sister   . Osteoporosis Sister   . Hypertension Maternal Grandmother   . Scoliosis Maternal Grandmother   . Heart disease Maternal Grandfather   . Heart disease Paternal Grandfather     smoker    History   Social History  . Marital Status: Married    Spouse Name: N/A    Number of Children: N/A  . Years of Education: N/A   Occupational History  . Not on file.   Social History Main Topics  . Smoking status: Former Smoker -- 1.00 packs/day for 20 years    Types: Cigarettes    Quit date: 07/18/1989  . Smokeless tobacco: Never Used     Comment: off and on smoking for the 20 yrs  . Alcohol Use: Yes     Comment: drink or two a day- mixed drink  . Drug Use: No  . Sexual Activity: Yes   Other Topics Concern  .  Not on file   Social History Narrative  . No narrative on file    Current Outpatient Prescriptions on File Prior to Visit  Medication Sig Dispense Refill  . acyclovir (ZOVIRAX) 200 MG capsule TAKE ONE CAPSULE BY MOUTH 5 TIMES A DAY  25 capsule  1  . aspirin 81 MG tablet Take 81 mg by mouth daily.      Marland Kitchen atorvastatin (LIPITOR) 20 MG tablet TAKE 1/2 TABLET BY MOUTH EVERY DAY  45 tablet  1  . b complex vitamins tablet Take 1 tablet by mouth daily.      . celecoxib (CELEBREX) 200 MG capsule Take 200 mg by mouth daily.       Marland Kitchen glucose blood (ONE TOUCH ULTRA TEST) test strip Check daily  32 each  1  . lisinopril (PRINIVIL,ZESTRIL) 5 MG tablet TAKE 1 TABLET BY MOUTH EVERY DAY  90 tablet  3  . metFORMIN (GLUCOPHAGE) 500 MG tablet Take 1 tablet (500 mg total) by mouth daily.  90 tablet  3  . Multiple Vitamin (MULTIVITAMIN) tablet Take 1 tablet by mouth daily.      . tamsulosin (FLOMAX) 0.4 MG CAPS capsule Take 2 capsules (0.8 mg total) by mouth daily.  180 capsule  2  .  testosterone cypionate (DEPOTESTOTERONE CYPIONATE) 200 MG/ML injection Inject 200 mg into the muscle every 14 (fourteen) days.      . traMADol (ULTRAM) 50 MG tablet Take 50 mg by mouth 4 (four) times daily.      Marland Kitchen venlafaxine (EFFEXOR) 75 MG tablet Take 1 tablet (75 mg total) by mouth daily at 6 (six) AM.  90 tablet  1  . albuterol (PROVENTIL HFA;VENTOLIN HFA) 108 (90 BASE) MCG/ACT inhaler Inhale 2 puffs into the lungs every 6 (six) hours as needed for wheezing. Try 1-2 puffs prior to exercise  1 Inhaler    . cetirizine (ZYRTEC) 10 MG tablet Take 1 tablet (10 mg total) by mouth daily as needed for allergies or rhinitis.  30 tablet  11  . diazepam (VALIUM) 10 MG tablet Take 1/2-1 tablet po qhs prn  30 tablet  1  . ECHINACEA PLUS CAPS Take 1 capsule by mouth as needed. 750 mg      . fluticasone (FLONASE) 50 MCG/ACT nasal spray Place 2 sprays into the nose daily.  48 g  1  . sildenafil (VIAGRA) 100 MG tablet Take 1/2 tab to 1 tab po daily prn ED  10 tablet  2  . zolpidem (AMBIEN) 5 MG tablet Take 1 tablet (5 mg total) by mouth at bedtime as needed for sleep.  15 tablet  3   No current facility-administered medications on file prior to visit.    No Known Allergies  Review of Systems  Review of Systems  Constitutional: Positive for malaise/fatigue. Negative for fever.  HENT: Positive for congestion, ear pain and sore throat.   Eyes: Negative for discharge.  Respiratory: Positive for cough, sputum production and shortness of breath.   Cardiovascular: Negative for chest pain, palpitations and leg swelling.  Gastrointestinal: Negative for nausea, abdominal pain and diarrhea.  Genitourinary: Negative for dysuria.  Musculoskeletal: Positive for myalgias. Negative for falls.  Skin: Negative for rash.  Neurological: Positive for headaches. Negative for loss of consciousness.  Endo/Heme/Allergies: Negative for polydipsia.  Psychiatric/Behavioral: Negative for depression and suicidal ideas. The  patient is not nervous/anxious and does not have insomnia.     Objective  BP 124/72  Pulse 84  Temp(Src) 98.1 F (36.7 C) (  Oral)  Resp 16  Ht 5' 9.5" (1.765 m)  Wt 193 lb (87.544 kg)  BMI 28.10 kg/m2  SpO2 98%  Physical Exam  Physical Exam  Constitutional: He is oriented to person, place, and time and well-developed, well-nourished, and in no distress. No distress.  HENT:  Head: Normocephalic and atraumatic.  Oropharynx erythematous  Eyes: Conjunctivae are normal.  Neck: Neck supple. No thyromegaly present.  Cardiovascular: Normal rate, regular rhythm and normal heart sounds.   No murmur heard. Pulmonary/Chest: Effort normal and breath sounds normal. No respiratory distress.  Abdominal: He exhibits no distension and no mass. There is no tenderness.  Musculoskeletal: He exhibits no edema.  Neurological: He is alert and oriented to person, place, and time.  Skin: Skin is warm.  Psychiatric: Memory, affect and judgment normal.    Lab Results  Component Value Date   TSH 3.447 05/06/2013   Lab Results  Component Value Date   WBC 6.5 05/06/2013   HGB 16.4 05/06/2013   HCT 46.6 05/06/2013   MCV 95.3 05/06/2013   PLT 322 05/06/2013   Lab Results  Component Value Date   CREATININE 0.96 05/06/2013   BUN 18 05/06/2013   NA 138 05/06/2013   K 5.0 05/06/2013   CL 102 05/06/2013   CO2 30 05/06/2013   Lab Results  Component Value Date   ALT 15 05/06/2013   AST 25 05/06/2013   ALKPHOS 64 05/06/2013   BILITOT 0.9 05/06/2013   Lab Results  Component Value Date   CHOL 124 05/06/2013   Lab Results  Component Value Date   HDL 63 05/06/2013   Lab Results  Component Value Date   LDLCALC 50 05/06/2013   Lab Results  Component Value Date   TRIG 56 05/06/2013   Lab Results  Component Value Date   CHOLHDL 2.0 05/06/2013     Assessment & Plan   HTN (hypertension) Well controlled, no changes to meds. Encouraged heart healthy diet such as the DASH diet and  exercise as tolerated. Has been checking at home with good numbers  Acute pharyngitis Encouraged increased rest and hydration, add probiotics, zinc such as Coldeze or Xicam. Treat fevers as needed. Recent strep contact, given symptoms will start Augmentin

## 2013-10-21 NOTE — Assessment & Plan Note (Signed)
Well controlled, no changes to meds. Encouraged heart healthy diet such as the DASH diet and exercise as tolerated. Has been checking at home with good numbers

## 2013-10-21 NOTE — Progress Notes (Signed)
Pre visit review using our clinic review tool, if applicable. No additional management support is needed unless otherwise documented below in the visit note. 

## 2013-10-22 ENCOUNTER — Telehealth: Payer: Self-pay | Admitting: Family Medicine

## 2013-10-22 NOTE — Telephone Encounter (Signed)
Relevant patient education assigned to patient using Emmi. ° °

## 2013-10-29 ENCOUNTER — Encounter: Payer: Medicare Other | Admitting: Family Medicine

## 2013-10-30 ENCOUNTER — Telehealth: Payer: Self-pay | Admitting: *Deleted

## 2013-10-30 NOTE — Telephone Encounter (Signed)
Pt left message on voicemail that ambien doesn't seem to be helping any longer and he is wanting to know if he can go back on Valium?  Please advise.

## 2013-10-30 NOTE — Telephone Encounter (Signed)
OK to d/c Zolpidem and restart Diazepam 10 mg 1 tab po qhs prn insomnia, disp #30 with 1 rf

## 2013-10-31 MED ORDER — DIAZEPAM 10 MG PO TABS: 10.0000 mg | ORAL_TABLET | Freq: Every evening | ORAL | Status: DC | PRN

## 2013-10-31 NOTE — Telephone Encounter (Signed)
RX faxed and patient informed.

## 2013-11-07 ENCOUNTER — Ambulatory Visit (INDEPENDENT_AMBULATORY_CARE_PROVIDER_SITE_OTHER): Payer: Medicare Other

## 2013-11-07 ENCOUNTER — Ambulatory Visit: Payer: Medicare Other

## 2013-11-07 DIAGNOSIS — E349 Endocrine disorder, unspecified: Secondary | ICD-10-CM

## 2013-11-07 DIAGNOSIS — E291 Testicular hypofunction: Secondary | ICD-10-CM

## 2013-11-07 MED ORDER — TESTOSTERONE CYPIONATE 200 MG/ML IM SOLN
200.0000 mg | INTRAMUSCULAR | Status: DC
  Administered 2013-11-07: 200 mg via INTRAMUSCULAR

## 2013-11-07 NOTE — Progress Notes (Signed)
   Subjective:    [name], male    DOB: April 11, 1946, 68 y.o.   MRN: [id number]  HPI    Review of Systems     Objective:   Physical Exam        Assessment & Plan:   Patient came in today for his testosterone injection. Pt tolerated injection well

## 2013-12-11 ENCOUNTER — Telehealth: Payer: Self-pay | Admitting: Family Medicine

## 2013-12-11 MED ORDER — ACYCLOVIR 200 MG PO CAPS: ORAL_CAPSULE | ORAL | Status: DC

## 2013-12-11 NOTE — Telephone Encounter (Signed)
Refill- acyclovir  [facility] [id number]

## 2013-12-11 NOTE — Telephone Encounter (Signed)
Pt also left message requesting refill. Left detailed message on cell # re: rx completion.

## 2013-12-12 ENCOUNTER — Encounter: Payer: Self-pay | Admitting: Family Medicine

## 2013-12-12 ENCOUNTER — Ambulatory Visit (INDEPENDENT_AMBULATORY_CARE_PROVIDER_SITE_OTHER): Payer: Medicare Other | Admitting: Family Medicine

## 2013-12-12 VITALS — BP 112/66 | HR 68 | Temp 98.0°F | Ht 69.5 in | Wt 189.0 lb

## 2013-12-12 DIAGNOSIS — R5381 Other malaise: Secondary | ICD-10-CM

## 2013-12-12 DIAGNOSIS — F341 Dysthymic disorder: Secondary | ICD-10-CM

## 2013-12-12 DIAGNOSIS — F329 Major depressive disorder, single episode, unspecified: Secondary | ICD-10-CM

## 2013-12-12 DIAGNOSIS — F419 Anxiety disorder, unspecified: Secondary | ICD-10-CM

## 2013-12-12 DIAGNOSIS — I10 Essential (primary) hypertension: Secondary | ICD-10-CM

## 2013-12-12 DIAGNOSIS — E663 Overweight: Secondary | ICD-10-CM

## 2013-12-12 DIAGNOSIS — R7989 Other specified abnormal findings of blood chemistry: Secondary | ICD-10-CM

## 2013-12-12 DIAGNOSIS — R5383 Other fatigue: Secondary | ICD-10-CM

## 2013-12-12 DIAGNOSIS — R7309 Other abnormal glucose: Secondary | ICD-10-CM

## 2013-12-12 DIAGNOSIS — E785 Hyperlipidemia, unspecified: Secondary | ICD-10-CM

## 2013-12-12 DIAGNOSIS — R739 Hyperglycemia, unspecified: Secondary | ICD-10-CM

## 2013-12-12 DIAGNOSIS — E291 Testicular hypofunction: Secondary | ICD-10-CM

## 2013-12-12 LAB — CBC
HCT: 45 % (ref 39.0–52.0)
Hemoglobin: 15.6 g/dL (ref 13.0–17.0)
MCH: 33.5 pg (ref 26.0–34.0)
MCHC: 34.7 g/dL (ref 30.0–36.0)
MCV: 96.6 fL (ref 78.0–100.0)
Platelets: 323 10*3/uL (ref 150–400)
RBC: 4.66 MIL/uL (ref 4.22–5.81)
RDW: 13.3 % (ref 11.5–15.5)
WBC: 6.4 10*3/uL (ref 4.0–10.5)

## 2013-12-12 LAB — RENAL FUNCTION PANEL
Albumin: 4.5 g/dL (ref 3.5–5.2)
BUN: 19 mg/dL (ref 6–23)
CO2: 29 mEq/L (ref 19–32)
Calcium: 9 mg/dL (ref 8.4–10.5)
Chloride: 100 mEq/L (ref 96–112)
Creat: 0.84 mg/dL (ref 0.50–1.35)
Glucose, Bld: 115 mg/dL — ABNORMAL HIGH (ref 70–99)
Phosphorus: 2.3 mg/dL (ref 2.3–4.6)
Potassium: 4.7 mEq/L (ref 3.5–5.3)
Sodium: 137 mEq/L (ref 135–145)

## 2013-12-12 LAB — LIPID PANEL
Cholesterol: 159 mg/dL (ref 0–200)
HDL: 77 mg/dL (ref >39)
LDL Cholesterol: 70 mg/dL (ref 0–99)
Total CHOL/HDL Ratio: 2.1 Ratio
Triglycerides: 60 mg/dL (ref <150)
VLDL: 12 mg/dL (ref 0–40)

## 2013-12-12 LAB — HEPATIC FUNCTION PANEL
ALT: 13 U/L (ref 0–53)
AST: 27 U/L (ref 0–37)
Albumin: 4.5 g/dL (ref 3.5–5.2)
Alkaline Phosphatase: 70 U/L (ref 39–117)
Bilirubin, Direct: 0.2 mg/dL (ref 0.0–0.3)
Indirect Bilirubin: 0.9 mg/dL (ref 0.2–1.2)
Total Bilirubin: 1.1 mg/dL (ref 0.2–1.2)
Total Protein: 6.5 g/dL (ref 6.0–8.3)

## 2013-12-12 LAB — TSH: TSH: 3.429 u[IU]/mL (ref 0.350–4.500)

## 2013-12-12 LAB — HEMOGLOBIN A1C
Hgb A1c MFr Bld: 6 % — ABNORMAL HIGH (ref <5.7)
Mean Plasma Glucose: 126 mg/dL — ABNORMAL HIGH (ref <117)

## 2013-12-12 NOTE — Assessment & Plan Note (Signed)
Tolerating statin, encouraged heart healthy diet, avoid trans fats, minimize simple carbs and saturated fats. Increase exercise as tolerated 

## 2013-12-12 NOTE — Assessment & Plan Note (Signed)
Well controlled, no changes to meds. Encouraged heart healthy diet such as the DASH diet and exercise as tolerated.  °

## 2013-12-12 NOTE — Assessment & Plan Note (Signed)
Worse for roughly 3 weeks, denies any new concerns or illness. Sleeping well.. Check labs today

## 2013-12-12 NOTE — Assessment & Plan Note (Signed)
Encouraged DASH diet, decrease po intake and increase exercise as tolerated. Needs 7-8 hours of sleep nightly. Avoid trans fats, eat small, frequent meals every 4-5 hours with lean proteins, complex carbs and healthy fats. Minimize simple carbs, GMO foods. 

## 2013-12-12 NOTE — Assessment & Plan Note (Signed)
Last A1C was 6.4 in fall 2014, repeat today. hgba1c acceptable, minimize simple carbs. Increase exercise as tolerated. Continue current meds

## 2013-12-12 NOTE — Progress Notes (Signed)
Pre visit review using our clinic review tool, if applicable. No additional management support is needed unless otherwise documented below in the visit note. 

## 2013-12-12 NOTE — Assessment & Plan Note (Signed)
Tolerating Venlafaxine

## 2013-12-12 NOTE — Patient Instructions (Signed)
Eat every 4 hours with complex carbohydrates and lean proteins    Basic Carbohydrate Counting Basic carbohydrate counting is a way to plan meals. It is done by counting the amount of carbohydrate in foods. Foods that have carbohydrates are starches (grains, beans, starchy vegetables) and sweets. Eating carbohydrates increases blood glucose (sugar) levels. People with diabetes use carbohydrate counting to help keep their blood glucose at a normal level.  COUNTING CARBOHYDRATES IN FOODS The first step in counting carbohydrates is to learn how many carbohydrate servings you should have in every meal. A dietitian can plan this for you. After learning the amount of carbohydrates to include in your meal plan, you can start to choose the carbohydrate-containing foods you want to eat.  There are 2 ways to identify the amount of carbohydrates in the foods you eat.  Read the Nutrition Facts panel on food labels. You need 2 pieces of information from the Nutrition Facts panel to count carbohydrates this way:  Serving size.  Total carbohydrate (in grams). Decide how many servings you will be eating. If it is 1 serving, you will be eating the amount of carbohydrate listed on the panel. If you will be eating 2 servings, you will be eating double the amount of carbohydrate listed on the panel.   Learn serving sizes. A serving size of most carbohydrate-containing foods is about 15 grams (g). Listed below are single serving sizes of common carbohydrate-containing foods:  1 slice bread.   cup unsweetened, dry cereal.   cup hot cereal.   cup rice.   cup mashed potatoes.   cup pasta.  1 cup fresh fruit.   cup canned fruit.  1 cup milk (whole, 2%, or skim).   cup starchy vegetables (peas, corn, or potatoes). Counting carbohydrates this way is similar to looking on the Nutrition Facts panel. Decide how many servings you will eat first. Multiply the number of servings you eat by 15 g. For example,  if you have 2 cups of strawberries, you had 2 servings. That means you had 30 g of carbohydrate (2 servings x 15 g = 30 g). CALCULATING CARBOHYDRATES IN A MEAL Sample dinner  3 oz chicken breast.   cup brown rice.   cup corn.  1 cup fat-free milk.  1 cup strawberries with sugar-free whipped topping. Carbohydrate calculation First, identify the foods that contain carbohydrate:  Rice.  Corn.  Milk.  Strawberries. Calculate the number of servings eaten:  2 servings rice.  1 serving corn.  1 serving milk.  1 serving strawberries. Multiply the number of servings by 15 g:  2 servings rice x 15 g = 30 g.  1 serving corn x 15 g = 15 g.  1 serving milk x 15 g = 15 g.  1 serving strawberries x 15 g = 15 g. Add the amounts to find the total carbohydrates eaten: 30 g + 15 g + 15 g + 15 g = 75 g carbohydrate eaten at dinner. Document Released: 07/04/2005 Document Revised: 09/26/2011 Document Reviewed: 05/20/2011 [facility] Patient Information 2014 [place], [place].

## 2013-12-13 LAB — TESTOSTERONE: Testosterone: 949 ng/dL — ABNORMAL HIGH (ref 300–890)

## 2013-12-15 ENCOUNTER — Encounter: Payer: Self-pay | Admitting: Family Medicine

## 2013-12-15 NOTE — Progress Notes (Signed)
[name], male   DOB: October 01, 1945, 68 y.o.   MRN: [id number] [name] [id number] Sep 30, 1945 12/15/2013      Progress Note-Follow Up  Subjective  Chief Complaint  Chief Complaint  Patient presents with  . Follow-up    2 month    HPI  Patient is a 68 year old male in today for routine medical care. Today for followup and lab work. Been feeling fairly well but does acknowledge increased fatigue over the last couple weeks. Sugars have been a Max of 170 but otherwise been closer to 120-150. Does not have any recent fevers or acute illness. Denies CP/palp/SOB/HA/congestion/fevers/GI or GU c/o. Taking meds as prescribed  Past Medical History  Diagnosis Date  . Chicken pox as a child  . Measles as a child  . Allergy     grass and pollen  . DDD (degenerative disc disease), lumbosacral   . DDD (degenerative disc disease)     cervical responds to steroid injections and low back required surgery  . Elevated BP   . Asthma     exercise induced  . Diabetes mellitus     pre  . Hyperlipidemia   . Overweight   . Insomnia   . Fatigue   . Preventative health care   . Anxiety and depression 10/25/2011  . BPH (benign prostatic hyperplasia) 04/23/2012  . ED (erectile dysfunction) 04/23/2012  . HTN (hypertension)   . Hyperglycemia     preDM   . 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  . Personal history of colonic polyps 10/27/2012    Follows with [facility]  . Acute pharyngitis 10/21/2013    Past Surgical History  Procedure Laterality Date  . Back surgery  2012 and 1994    Dr Margret Chance, screws and cage in low back  . Torn rotator cuff  2010    right  . Tonsillectomy      Family History  Problem Relation Age of Onset  . Hypertension Mother   . Diabetes Mother     type 2  . Cancer Mother 57    breast in remission  . Emphysema Father     smoker  . COPD Father     smoker  . Stroke Father 17    mini  . Heart disease Father   . Hypertension Sister   .  Hyperlipidemia Sister   . Scoliosis Sister   . Osteoporosis Sister   . Hypertension Maternal Grandmother   . Scoliosis Maternal Grandmother   . Heart disease Maternal Grandfather   . Heart disease Paternal Grandfather     smoker    History   Social History  . Marital Status: Married    Spouse Name: N/A    Number of Children: N/A  . Years of Education: N/A   Occupational History  . Not on file.   Social History Main Topics  . Smoking status: Former Smoker -- 1.00 packs/day for 20 years    Types: Cigarettes    Quit date: 07/18/1989  . Smokeless tobacco: Never Used     Comment: off and on smoking for the 20 yrs  . Alcohol Use: Yes     Comment: drink or two a day- mixed drink  . Drug Use: No  . Sexual Activity: Yes   Other Topics Concern  . Not on file   Social History Narrative  . No narrative on file    Current Outpatient Prescriptions on File Prior to Visit  Medication Sig Dispense Refill  .  aspirin 81 MG tablet Take 81 mg by mouth daily.      Marland Kitchen atorvastatin (LIPITOR) 20 MG tablet TAKE 1/2 TABLET BY MOUTH EVERY DAY  45 tablet  1  . b complex vitamins tablet Take 1 tablet by mouth daily.      . celecoxib (CELEBREX) 200 MG capsule Take 200 mg by mouth daily.       . cetirizine (ZYRTEC) 10 MG tablet Take 1 tablet (10 mg total) by mouth daily as needed for allergies or rhinitis.  30 tablet  11  . diazepam (VALIUM) 10 MG tablet Take 1 tablet (10 mg total) by mouth at bedtime as needed for sleep.  30 tablet  1  . ECHINACEA PLUS CAPS Take 1 capsule by mouth as needed. 750 mg      . fluticasone (FLONASE) 50 MCG/ACT nasal spray Place 2 sprays into the nose daily.  48 g  1  . glucose blood (ONE TOUCH ULTRA TEST) test strip Check daily  32 each  1  . lisinopril (PRINIVIL,ZESTRIL) 5 MG tablet TAKE 1 TABLET BY MOUTH EVERY DAY  90 tablet  3  . metFORMIN (GLUCOPHAGE) 500 MG tablet Take 1 tablet (500 mg total) by mouth daily.  90 tablet  3  . Multiple Vitamin (MULTIVITAMIN) tablet  Take 1 tablet by mouth daily.      . sildenafil (VIAGRA) 100 MG tablet Take 1/2 tab to 1 tab po daily prn ED  10 tablet  2  . tamsulosin (FLOMAX) 0.4 MG CAPS capsule Take 2 capsules (0.8 mg total) by mouth daily.  180 capsule  2  . testosterone cypionate (DEPOTESTOTERONE CYPIONATE) 200 MG/ML injection Inject 200 mg into the muscle every 14 (fourteen) days.      . traMADol (ULTRAM) 50 MG tablet Take 50 mg by mouth 4 (four) times daily.      Marland Kitchen venlafaxine (EFFEXOR) 75 MG tablet Take 1 tablet (75 mg total) by mouth daily at 6 (six) AM.  90 tablet  1   No current facility-administered medications on file prior to visit.    No Known Allergies  Review of Systems  Review of Systems  Constitutional: Positive for malaise/fatigue. Negative for fever.  HENT: Negative for congestion.   Eyes: Negative for discharge.  Respiratory: Negative for shortness of breath.   Cardiovascular: Negative for chest pain, palpitations and leg swelling.  Gastrointestinal: Negative for nausea, abdominal pain and diarrhea.  Genitourinary: Negative for dysuria.  Musculoskeletal: Negative for falls.  Skin: Negative for rash.  Neurological: Negative for loss of consciousness and headaches.  Endo/Heme/Allergies: Negative for polydipsia.  Psychiatric/Behavioral: Negative for depression and suicidal ideas. The patient is not nervous/anxious and does not have insomnia.     Objective  BP 112/66  Pulse 68  Temp(Src) 98 F (36.7 C) (Oral)  Ht 5' 9.5" (1.765 m)  Wt 189 lb (85.73 kg)  BMI 27.52 kg/m2  SpO2 96%  Physical Exam  Physical Exam  Constitutional: He is oriented to person, place, and time and well-developed, well-nourished, and in no distress. No distress.  HENT:  Head: Normocephalic and atraumatic.  Eyes: Conjunctivae are normal.  Neck: Neck supple. No thyromegaly present.  Cardiovascular: Normal rate, regular rhythm and normal heart sounds.   No murmur heard. Pulmonary/Chest: Effort normal and breath  sounds normal. No respiratory distress.  Abdominal: He exhibits no distension and no mass. There is no tenderness.  Musculoskeletal: He exhibits no edema.  Neurological: He is alert and oriented to person, place, and time.  Skin: Skin is warm.  Psychiatric: Memory, affect and judgment normal.    Lab Results  Component Value Date   TSH 3.429 12/12/2013   Lab Results  Component Value Date   WBC 6.4 12/12/2013   HGB 15.6 12/12/2013   HCT 45.0 12/12/2013   MCV 96.6 12/12/2013   PLT 323 12/12/2013   Lab Results  Component Value Date   CREATININE 0.84 12/12/2013   BUN 19 12/12/2013   NA 137 12/12/2013   K 4.7 12/12/2013   CL 100 12/12/2013   CO2 29 12/12/2013   Lab Results  Component Value Date   ALT 13 12/12/2013   AST 27 12/12/2013   ALKPHOS 70 12/12/2013   BILITOT 1.1 12/12/2013   Lab Results  Component Value Date   CHOL 159 12/12/2013   Lab Results  Component Value Date   HDL 77 12/12/2013   Lab Results  Component Value Date   LDLCALC 70 12/12/2013   Lab Results  Component Value Date   TRIG 60 12/12/2013   Lab Results  Component Value Date   CHOLHDL 2.1 12/12/2013     Assessment & Plan  HTN (hypertension) Well controlled, no changes to meds. Encouraged heart healthy diet such as the DASH diet and exercise as tolerated.   Fatigue Worse for roughly 3 weeks, denies any new concerns or illness. Sleeping well.. Check labs today  Hyperglycemia Last A1C was 6.4 in fall 2014, repeat today. hgba1c acceptable, minimize simple carbs. Increase exercise as tolerated. Continue current meds  Hyperlipidemia Tolerating statin, encouraged heart healthy diet, avoid trans fats, minimize simple carbs and saturated fats. Increase exercise as tolerated  Overweight Encouraged DASH diet, decrease po intake and increase exercise as tolerated. Needs 7-8 hours of sleep nightly. Avoid trans fats, eat small, frequent meals every 4-5 hours with lean proteins, complex carbs and healthy fats.  Minimize simple carbs, GMO foods.  Anxiety and depression Tolerating Venlafaxine

## 2014-02-12 ENCOUNTER — Other Ambulatory Visit: Payer: Self-pay | Admitting: Family Medicine

## 2014-02-24 ENCOUNTER — Other Ambulatory Visit: Payer: Self-pay | Admitting: Family Medicine

## 2014-02-24 ENCOUNTER — Telehealth: Payer: Self-pay | Admitting: Family Medicine

## 2014-02-24 MED ORDER — ATORVASTATIN CALCIUM 20 MG PO TABS: 10.0000 mg | ORAL_TABLET | Freq: Every day | ORAL | Status: DC

## 2014-02-24 NOTE — Telephone Encounter (Signed)
Refill- atorvastatin  [facility] [phone]

## 2014-02-26 NOTE — Telephone Encounter (Signed)
Pt has upcoming appt on 03/14/14. Last rx printed 10/31/13, #30 x 1 refill. Rx printed and forwarded to Provider for signature.

## 2014-02-27 NOTE — Telephone Encounter (Signed)
RX faxed

## 2014-03-04 ENCOUNTER — Telehealth: Payer: Self-pay | Admitting: Family Medicine

## 2014-03-04 MED ORDER — VENLAFAXINE HCL 75 MG PO TABS: 75.0000 mg | ORAL_TABLET | Freq: Every day | ORAL | Status: DC

## 2014-03-04 NOTE — Telephone Encounter (Signed)
Sent rx to [facility] for 90 day supply no refills.

## 2014-03-04 NOTE — Telephone Encounter (Signed)
Refill- vanlafaxine  [facility] [id number]

## 2014-03-14 ENCOUNTER — Ambulatory Visit: Payer: Medicare Other | Admitting: Family Medicine

## 2014-04-09 DIAGNOSIS — M48061 Spinal stenosis, lumbar region without neurogenic claudication: Secondary | ICD-10-CM | POA: Diagnosis present

## 2014-04-09 DIAGNOSIS — M4030 Flatback syndrome, site unspecified: Secondary | ICD-10-CM | POA: Insufficient documentation

## 2014-04-28 ENCOUNTER — Other Ambulatory Visit: Payer: Self-pay | Admitting: Family Medicine

## 2014-04-28 NOTE — Telephone Encounter (Signed)
Last RX was done on 09-30-13  rx printed for md to sign and fax

## 2014-06-01 ENCOUNTER — Other Ambulatory Visit: Payer: Self-pay | Admitting: Family Medicine

## 2014-06-14 ENCOUNTER — Other Ambulatory Visit: Payer: Self-pay | Admitting: Family Medicine

## 2014-06-25 DIAGNOSIS — I10 Essential (primary) hypertension: Secondary | ICD-10-CM | POA: Insufficient documentation

## 2014-06-25 DIAGNOSIS — R7309 Other abnormal glucose: Secondary | ICD-10-CM | POA: Insufficient documentation

## 2014-06-25 DIAGNOSIS — J45909 Unspecified asthma, uncomplicated: Secondary | ICD-10-CM | POA: Insufficient documentation

## 2014-06-25 DIAGNOSIS — E663 Overweight: Secondary | ICD-10-CM | POA: Insufficient documentation

## 2014-06-25 DIAGNOSIS — E7849 Other hyperlipidemia: Secondary | ICD-10-CM | POA: Insufficient documentation

## 2014-07-10 ENCOUNTER — Telehealth: Payer: Self-pay | Admitting: Family Medicine

## 2014-07-10 NOTE — Telephone Encounter (Signed)
Called and spoke with the pt and he stated that [facility] filled med for him.//AB/CMA

## 2014-07-10 NOTE — Telephone Encounter (Signed)
Caller name: caroline Relation to pt: wife Call back number: [phone] Pharmacy:  Reason for call:   Patient wife states that patient will be out of oxycodone tomorrow and would like a new rx

## 2014-07-15 ENCOUNTER — Encounter: Payer: Self-pay | Admitting: Family Medicine

## 2014-08-13 DIAGNOSIS — M4806 Spinal stenosis, lumbar region: Secondary | ICD-10-CM | POA: Diagnosis not present

## 2014-08-15 ENCOUNTER — Other Ambulatory Visit: Payer: Self-pay | Admitting: Family Medicine

## 2014-09-02 ENCOUNTER — Ambulatory Visit: Payer: Medicare Other | Admitting: *Deleted

## 2014-09-02 ENCOUNTER — Other Ambulatory Visit (INDEPENDENT_AMBULATORY_CARE_PROVIDER_SITE_OTHER): Payer: Medicare Other

## 2014-09-02 DIAGNOSIS — R5382 Chronic fatigue, unspecified: Secondary | ICD-10-CM

## 2014-09-02 DIAGNOSIS — E291 Testicular hypofunction: Secondary | ICD-10-CM

## 2014-09-02 NOTE — Progress Notes (Signed)
Patient here for testosterone injection.  Patient has not had testosterone checked since 11/2013.  Labs ordered per verbal order from Dr. [name].  Patient notified.  Injection not administered due to need for lab.  eal

## 2014-09-03 ENCOUNTER — Other Ambulatory Visit: Payer: Self-pay | Admitting: Family Medicine

## 2014-09-03 LAB — TESTOSTERONE, FREE, TOTAL, SHBG
Sex Hormone Binding: 20 nmol/L — ABNORMAL LOW (ref 22–77)
Testosterone, Free: 15.8 pg/mL — ABNORMAL LOW (ref 47.0–244.0)
Testosterone-% Free: 2.4 % (ref 1.6–2.9)
Testosterone: 67 ng/dL — ABNORMAL LOW (ref 300–890)

## 2014-09-03 MED ORDER — TESTOSTERONE CYPIONATE 200 MG/ML IM SOLN: INTRAMUSCULAR | Status: DC

## 2014-09-04 ENCOUNTER — Other Ambulatory Visit: Payer: Self-pay | Admitting: Family Medicine

## 2014-09-04 NOTE — Telephone Encounter (Signed)
Faxed hardcopy [facility] [address].

## 2014-09-05 ENCOUNTER — Ambulatory Visit (INDEPENDENT_AMBULATORY_CARE_PROVIDER_SITE_OTHER): Payer: Medicare Other | Admitting: *Deleted

## 2014-09-05 DIAGNOSIS — E291 Testicular hypofunction: Secondary | ICD-10-CM | POA: Diagnosis not present

## 2014-09-05 MED ORDER — TESTOSTERONE CYPIONATE 200 MG/ML IM SOLN
100.0000 mg | Freq: Once | INTRAMUSCULAR | Status: AC
  Administered 2014-09-05: 100 mg via INTRAMUSCULAR

## 2014-09-05 NOTE — Progress Notes (Signed)
Pre visit review using our clinic review tool, if applicable. No additional management support is needed unless otherwise documented below in the visit note.  Patient tolerated injection well.  Next appointment scheduled for 09/19/14.

## 2014-09-08 ENCOUNTER — Other Ambulatory Visit: Payer: Self-pay | Admitting: Family Medicine

## 2014-09-19 ENCOUNTER — Ambulatory Visit (INDEPENDENT_AMBULATORY_CARE_PROVIDER_SITE_OTHER): Payer: Medicare Other | Admitting: *Deleted

## 2014-09-19 DIAGNOSIS — E291 Testicular hypofunction: Secondary | ICD-10-CM

## 2014-09-19 MED ORDER — TESTOSTERONE CYPIONATE 200 MG/ML IM SOLN
100.0000 mg | Freq: Once | INTRAMUSCULAR | Status: AC
  Administered 2014-09-19: 100 mg via INTRAMUSCULAR

## 2014-09-19 NOTE — Progress Notes (Signed)
Pre visit review using our clinic review tool, if applicable. No additional management support is needed unless otherwise documented below in the visit note.  Patient tolerated injection well.  Next injection scheduled for 10/03/14.

## 2014-09-22 ENCOUNTER — Other Ambulatory Visit: Payer: Self-pay | Admitting: Family Medicine

## 2014-09-22 NOTE — Telephone Encounter (Signed)
Faxed [facility] to [facility]. Upcoming appt. Is with PCP.

## 2014-09-22 NOTE — Telephone Encounter (Signed)
So I will write him a refill on the Valium for #30 but no more til seen since I have not seen him for awhile. I assume the march appt in the computer is with me?

## 2014-09-30 ENCOUNTER — Other Ambulatory Visit: Payer: Self-pay | Admitting: Family Medicine

## 2014-10-02 ENCOUNTER — Other Ambulatory Visit: Payer: Self-pay | Admitting: Family Medicine

## 2014-10-03 ENCOUNTER — Other Ambulatory Visit: Payer: Self-pay | Admitting: *Deleted

## 2014-10-03 ENCOUNTER — Ambulatory Visit (INDEPENDENT_AMBULATORY_CARE_PROVIDER_SITE_OTHER): Payer: Medicare Other | Admitting: *Deleted

## 2014-10-03 DIAGNOSIS — E291 Testicular hypofunction: Secondary | ICD-10-CM | POA: Diagnosis not present

## 2014-10-03 MED ORDER — TESTOSTERONE CYPIONATE 200 MG/ML IM SOLN
100.0000 mg | Freq: Once | INTRAMUSCULAR | Status: AC
  Administered 2014-10-03: 100 mg via INTRAMUSCULAR

## 2014-10-03 NOTE — Progress Notes (Signed)
Pre visit review using our clinic review tool, if applicable. No additional management support is needed unless otherwise documented below in the visit note.  Patient tolerated injection well.  Next injection scheduled 10/17/14.

## 2014-10-03 NOTE — Telephone Encounter (Signed)
Patient requesting refill of Tramadol and Valium (requested during nurse visit).  Tramadol was previously prescribed by his back doctor, but he will not be seeing him anymore.   Tramadol:  Last refill 10/19/11- can not find # and refills Last seen: 12/12/13  No contract/UDS found   Valium- is too early but patient was told he would need office visit to get next refill, so he scheduled a physical (which is 02/10/15-first available).  Wants to know if he needs to come in for OV before that to get more refills.

## 2014-10-03 NOTE — Telephone Encounter (Signed)
I am willing to take over there the Tramadol and refill the Valium but it has been too long since I saw him. He needs an appt for me to write his meds. I think he also needs contract and UDS. Please let him know

## 2014-10-06 NOTE — Telephone Encounter (Signed)
Patient scheduled an appointment for 10/20/14 (first available).  He states he has enough Tramadol to last until this time, but may he have a refill of the Valium before his appointment?  Agrees to do UDS and Contract at visit.

## 2014-10-06 NOTE — Telephone Encounter (Signed)
Yes he can have a refill on Valium 5 mg tabs 1 tab po bid prn anxiety, disp # 30 til seen

## 2014-10-07 MED ORDER — DIAZEPAM 10 MG PO TABS: 10.0000 mg | ORAL_TABLET | Freq: Every evening | ORAL | Status: DC | PRN

## 2014-10-07 NOTE — Telephone Encounter (Signed)
Patient is taking 10mg  once daily at bedtime for sleep.  Would you like me to reorder per note below or per previous order?

## 2014-10-07 NOTE — Telephone Encounter (Signed)
OK to d/c Valium 5 mg and write for Valium 10 mg tab 1 tab po qhs prn anxiety or insomnia, disp #30  No rf. THX

## 2014-10-07 NOTE — Telephone Encounter (Signed)
Rx printed and faxed to pharmacy. Patient notified. 

## 2014-10-08 DIAGNOSIS — M545 Low back pain: Secondary | ICD-10-CM | POA: Diagnosis not present

## 2014-10-08 DIAGNOSIS — M5136 Other intervertebral disc degeneration, lumbar region: Secondary | ICD-10-CM | POA: Diagnosis not present

## 2014-10-08 DIAGNOSIS — Z981 Arthrodesis status: Secondary | ICD-10-CM | POA: Diagnosis not present

## 2014-10-08 DIAGNOSIS — M4806 Spinal stenosis, lumbar region: Secondary | ICD-10-CM | POA: Diagnosis not present

## 2014-10-17 ENCOUNTER — Ambulatory Visit: Payer: Medicare Other

## 2014-10-20 ENCOUNTER — Encounter: Payer: Self-pay | Admitting: Family Medicine

## 2014-10-20 ENCOUNTER — Ambulatory Visit (INDEPENDENT_AMBULATORY_CARE_PROVIDER_SITE_OTHER): Payer: Medicare Other | Admitting: Family Medicine

## 2014-10-20 VITALS — BP 132/80 | HR 95 | Temp 97.4°F | Resp 18 | Ht 71.0 in | Wt 200.0 lb

## 2014-10-20 DIAGNOSIS — E119 Type 2 diabetes mellitus without complications: Secondary | ICD-10-CM

## 2014-10-20 DIAGNOSIS — I10 Essential (primary) hypertension: Secondary | ICD-10-CM

## 2014-10-20 DIAGNOSIS — E1169 Type 2 diabetes mellitus with other specified complication: Secondary | ICD-10-CM

## 2014-10-20 DIAGNOSIS — R739 Hyperglycemia, unspecified: Secondary | ICD-10-CM

## 2014-10-20 DIAGNOSIS — E782 Mixed hyperlipidemia: Secondary | ICD-10-CM | POA: Diagnosis not present

## 2014-10-20 DIAGNOSIS — E291 Testicular hypofunction: Secondary | ICD-10-CM

## 2014-10-20 DIAGNOSIS — M5137 Other intervertebral disc degeneration, lumbosacral region: Secondary | ICD-10-CM

## 2014-10-20 DIAGNOSIS — R7989 Other specified abnormal findings of blood chemistry: Secondary | ICD-10-CM

## 2014-10-20 DIAGNOSIS — E669 Obesity, unspecified: Secondary | ICD-10-CM

## 2014-10-20 DIAGNOSIS — E349 Endocrine disorder, unspecified: Secondary | ICD-10-CM

## 2014-10-20 DIAGNOSIS — E785 Hyperlipidemia, unspecified: Secondary | ICD-10-CM

## 2014-10-20 MED ORDER — TESTOSTERONE CYPIONATE 200 MG/ML IM SOLN
100.0000 mg | Freq: Once | INTRAMUSCULAR | Status: AC
  Administered 2014-10-20: 100 mg via INTRAMUSCULAR

## 2014-10-20 MED ORDER — TRAMADOL HCL 50 MG PO TABS: 100.0000 mg | ORAL_TABLET | Freq: Two times a day (BID) | ORAL | Status: DC | PRN

## 2014-10-20 MED ORDER — CELECOXIB 200 MG PO CAPS: 200.0000 mg | ORAL_CAPSULE | Freq: Two times a day (BID) | ORAL | Status: DC

## 2014-10-20 NOTE — Patient Instructions (Signed)
Melatonin, L Tryptophan for sleep   Insomnia Insomnia is frequent trouble falling and/or staying asleep. Insomnia can be a long term problem or a short term problem. Both are common. Insomnia can be a short term problem when the wakefulness is related to a certain stress or worry. Long term insomnia is often related to ongoing stress during waking hours and/or poor sleeping habits. Overtime, sleep deprivation itself can make the problem worse. Every little thing feels more severe because you are overtired and your ability to cope is decreased. CAUSES   Stress, anxiety, and depression.  Poor sleeping habits.  Distractions such as TV in the bedroom.  Naps close to bedtime.  Engaging in emotionally charged conversations before bed.  Technical reading before sleep.  Alcohol and other sedatives. They may make the problem worse. They can hurt normal sleep patterns and normal dream activity.  Stimulants such as caffeine for several hours prior to bedtime.  Pain syndromes and shortness of breath can cause insomnia.  Exercise late at night.  Changing time zones may cause sleeping problems (jet lag). It is sometimes helpful to have someone observe your sleeping patterns. They should look for periods of not breathing during the night (sleep apnea). They should also look to see how long those periods last. If you live alone or observers are uncertain, you can also be observed at a sleep clinic where your sleep patterns will be professionally monitored. Sleep apnea requires a checkup and treatment. Give your caregivers your medical history. Give your caregivers observations your family has made about your sleep.  SYMPTOMS   Not feeling rested in the morning.  Anxiety and restlessness at bedtime.  Difficulty falling and staying asleep. TREATMENT   Your caregiver may prescribe treatment for an underlying medical disorders. Your caregiver can give advice or help if you are using alcohol or  other drugs for self-medication. Treatment of underlying problems will usually eliminate insomnia problems.  Medications can be prescribed for short time use. They are generally not recommended for lengthy use.  Over-the-counter sleep medicines are not recommended for lengthy use. They can be habit forming.  You can promote easier sleeping by making lifestyle changes such as:  Using relaxation techniques that help with breathing and reduce muscle tension.  Exercising earlier in the day.  Changing your diet and the time of your last meal. No night time snacks.  Establish a regular time to go to bed.  Counseling can help with stressful problems and worry.  Soothing music and white noise may be helpful if there are background noises you cannot remove.  Stop tedious detailed work at least one hour before bedtime. HOME CARE INSTRUCTIONS   Keep a diary. Inform your caregiver about your progress. This includes any medication side effects. See your caregiver regularly. Take note of:  Times when you are asleep.  Times when you are awake during the night.  The quality of your sleep.  How you feel the next day. This information will help your caregiver care for you.  Get out of bed if you are still awake after 15 minutes. Read or do some quiet activity. Keep the lights down. Wait until you feel sleepy and go back to bed.  Keep regular sleeping and waking hours. Avoid naps.  Exercise regularly.  Avoid distractions at bedtime. Distractions include watching television or engaging in any intense or detailed activity like attempting to balance the household checkbook.  Develop a bedtime ritual. Keep a familiar routine of bathing, brushing your  teeth, climbing into bed at the same time each night, listening to soothing music. Routines increase the success of falling to sleep faster.  Use relaxation techniques. This can be using breathing and muscle tension release routines. It can also  include visualizing peaceful scenes. You can also help control troubling or intruding thoughts by keeping your mind occupied with boring or repetitive thoughts like the old concept of counting sheep. You can make it more creative like imagining planting one beautiful flower after another in your backyard garden.  During your day, work to eliminate stress. When this is not possible use some of the previous suggestions to help reduce the anxiety that accompanies stressful situations. MAKE SURE YOU:   Understand these instructions.  Will watch your condition.  Will get help right away if you are not doing well or get worse. Document Released: 07/01/2000 Document Revised: 09/26/2011 Document Reviewed: 08/01/2007 [facility] Patient Information 2015 [place], [place]. This information is not intended to replace advice given to you by your health care provider. Make sure you discuss any questions you have with your health care provider.

## 2014-10-26 ENCOUNTER — Encounter: Payer: Self-pay | Admitting: Family Medicine

## 2014-10-26 NOTE — Assessment & Plan Note (Signed)
Tolerating statin, encouraged heart healthy diet, avoid trans fats, minimize simple carbs and saturated fats. Increase exercise as tolerated 

## 2014-10-26 NOTE — Assessment & Plan Note (Signed)
Well controlled, no changes to meds. Encouraged heart healthy diet such as the DASH diet and exercise as tolerated.  °

## 2014-10-26 NOTE — Assessment & Plan Note (Signed)
Agrees to return for fasting labs prior to next appt. hgba1c acceptable, minimize simple carbs. Increase exercise as tolerated. Continue current meds

## 2014-10-26 NOTE — Progress Notes (Signed)
Luis Brown  [id number] Oct 19, 1945 10/26/2014      Progress Note-Follow Up  Subjective  Chief Complaint  Chief Complaint  Patient presents with  . Follow-up    Meds and testosterone injection    HPI  Patient is a 69 y.o. male in today for routine medical care. Patient is in today for follow-up. Did have surgery on his back on December 10. Follows with [facility] at [place]. Is requesting that we start to take over his maintenance medications for pain. No recent illness. Denies polyuria or polydipsia. Denies CP/palp/SOB/HA/congestion/fevers/GI or GU c/o. Taking meds as prescribed  Past Medical History  Diagnosis Date  . Chicken pox as a child  . Measles as a child  . Allergy     grass and pollen  . DDD (degenerative disc disease), lumbosacral   . DDD (degenerative disc disease)     cervical responds to steroid injections and low back required surgery  . Elevated BP   . Asthma     exercise induced  . Diabetes mellitus     pre  . Hyperlipidemia   . Overweight(278.02)   . Insomnia   . Fatigue   . Preventative health care   . Anxiety and depression 10/25/2011  . BPH (benign prostatic hyperplasia) 04/23/2012  . ED (erectile dysfunction) 04/23/2012  . HTN (hypertension)   . Hyperglycemia     preDM   . 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  . Personal history of colonic polyps 10/27/2012    Follows with [facility]  . Acute pharyngitis 10/21/2013    Past Surgical History  Procedure Laterality Date  . Back surgery  2012 and 1994    Dr Margret Chance, screws and cage in low back  . Torn rotator cuff  2010    right  . Tonsillectomy      Family History  Problem Relation Age of Onset  . Hypertension Mother   . Diabetes Mother     type 2  . Cancer Mother 43    breast in remission  . Emphysema Father     smoker  . COPD Father     smoker  . Stroke Father 61    mini  . Heart disease Father   . Hypertension Sister   . Hyperlipidemia Sister   .  Scoliosis Sister   . Osteoporosis Sister   . Hypertension Maternal Grandmother   . Scoliosis Maternal Grandmother   . Heart disease Maternal Grandfather   . Heart disease Paternal Grandfather     smoker    History   Social History  . Marital Status: Married    Spouse Name: N/A  . Number of Children: N/A  . Years of Education: N/A   Occupational History  . Not on file.   Social History Main Topics  . Smoking status: Former Smoker -- 1.00 packs/day for 20 years    Types: Cigarettes    Quit date: 07/18/1989  . Smokeless tobacco: Never Used     Comment: off and on smoking for the 20 yrs  . Alcohol Use: Yes     Comment: drink or two a day- mixed drink  . Drug Use: No  . Sexual Activity: Yes   Other Topics Concern  . Not on file   Social History Narrative    Current Outpatient Prescriptions on File Prior to Visit  Medication Sig Dispense Refill  . aspirin 81 MG tablet Take 81 mg by mouth daily.    Marland Kitchen atorvastatin (LIPITOR) 20 MG tablet  TAKE 1/2 A TABLET BY MOUTH EVERY DAY AT 6PM 45 tablet 0  . b complex vitamins tablet Take 1 tablet by mouth daily.    . diazepam (VALIUM) 10 MG tablet Take 1 tablet (10 mg total) by mouth at bedtime as needed. for sleep 30 tablet 0  . glucose blood (ONE TOUCH ULTRA TEST) test strip Check daily 32 each 1  . lisinopril (PRINIVIL,ZESTRIL) 5 MG tablet TAKE 1 TABLET BY MOUTH EVERY DAY 90 tablet 3  . metFORMIN (GLUCOPHAGE) 500 MG tablet TAKE 1 TABLET BY MOUTH EVERY DAY 90 tablet 0  . Multiple Vitamin (MULTIVITAMIN) tablet Take 1 tablet by mouth daily.    . tamsulosin (FLOMAX) 0.4 MG CAPS capsule TAKE 2 CAPSULES (0.8 MG TOTAL) BY MOUTH DAILY. 180 capsule 2  . testosterone cypionate (DEPOTESTOTERONE CYPIONATE) 200 MG/ML injection INJECT 0.5 MLS INTO THE MUSCLE EVERY 14 DAYS 10 mL 1  . venlafaxine (EFFEXOR) 75 MG tablet TAKE 1 TABLET BY MOUTH EVERY DAY AT 6AM 90 tablet 0  . cetirizine (ZYRTEC) 10 MG tablet Take 1 tablet (10 mg total) by mouth daily as  needed for allergies or rhinitis. 30 tablet 11  . ECHINACEA PLUS CAPS Take 1 capsule by mouth as needed. 750 mg    . fluticasone (FLONASE) 50 MCG/ACT nasal spray Place 2 sprays into the nose daily. 48 g 1  . sildenafil (VIAGRA) 100 MG tablet Take 1/2 tab to 1 tab po daily prn ED (Patient not taking: Reported on 10/20/2014) 10 tablet 2   No current facility-administered medications on file prior to visit.    No Known Allergies  Review of Systems  Review of Systems  Constitutional: Negative for fever and malaise/fatigue.  HENT: Negative for congestion.   Eyes: Negative for discharge.  Respiratory: Negative for shortness of breath.   Cardiovascular: Negative for chest pain, palpitations and leg swelling.  Gastrointestinal: Negative for nausea, abdominal pain and diarrhea.  Genitourinary: Negative for dysuria.  Musculoskeletal: Positive for back pain. Negative for falls.  Skin: Negative for rash.  Neurological: Negative for loss of consciousness and headaches.  Endo/Heme/Allergies: Negative for polydipsia.  Psychiatric/Behavioral: Negative for depression and suicidal ideas. The patient is not nervous/anxious and does not have insomnia.     Objective  BP 132/80 mmHg  Pulse 95  Temp(Src) 97.4 F (36.3 C) (Oral)  Resp 18  Ht 5\' 11"  (1.803 m)  Wt 200 lb (90.719 kg)  BMI 27.91 kg/m2  SpO2 98%  Physical Exam  Physical Exam  Constitutional: He is oriented to person, place, and time and well-developed, well-nourished, and in no distress. No distress.  HENT:  Head: Normocephalic and atraumatic.  Eyes: Conjunctivae are normal.  Neck: Neck supple. No thyromegaly present.  Cardiovascular: Normal rate, regular rhythm and normal heart sounds.   No murmur heard. Pulmonary/Chest: Effort normal and breath sounds normal. No respiratory distress.  Abdominal: He exhibits no distension and no mass. There is no tenderness.  Musculoskeletal: He exhibits no edema.  Neurological: He is alert and  oriented to person, place, and time.  Skin: Skin is warm.  Psychiatric: Memory, affect and judgment normal.    Lab Results  Component Value Date   TSH 3.429 12/12/2013   Lab Results  Component Value Date   WBC 6.4 12/12/2013   HGB 15.6 12/12/2013   HCT 45.0 12/12/2013   MCV 96.6 12/12/2013   PLT 323 12/12/2013   Lab Results  Component Value Date   CREATININE 0.84 12/12/2013   BUN 19 12/12/2013  NA 137 12/12/2013   K 4.7 12/12/2013   CL 100 12/12/2013   CO2 29 12/12/2013   Lab Results  Component Value Date   ALT 13 12/12/2013   AST 27 12/12/2013   ALKPHOS 70 12/12/2013   BILITOT 1.1 12/12/2013   Lab Results  Component Value Date   CHOL 159 12/12/2013   Lab Results  Component Value Date   HDL 77 12/12/2013   Lab Results  Component Value Date   LDLCALC 70 12/12/2013   Lab Results  Component Value Date   TRIG 60 12/12/2013   Lab Results  Component Value Date   CHOLHDL 2.1 12/12/2013     Assessment & Plan  HTN (hypertension) Well controlled, no changes to meds. Encouraged heart healthy diet such as the DASH diet and exercise as tolerated.    DDD (degenerative disc disease), lumbosacral Is requesting that we begin writing his Celebrex and Tramadol, will agree, Encouraged moist heat and gentle stretching as tolerated. May try prescription meds as directed and report if symptoms worsen or seek immediate care   Hyperglycemia Agrees to return for fasting labs prior to next appt. hgba1c acceptable, minimize simple carbs. Increase exercise as tolerated. Continue current meds   Hyperlipidemia Tolerating statin, encouraged heart healthy diet, avoid trans fats, minimize simple carbs and saturated fats. Increase exercise as tolerated

## 2014-10-26 NOTE — Assessment & Plan Note (Addendum)
Is requesting that we begin writing his Celebrex and Tramadol, will agree, Encouraged moist heat and gentle stretching as tolerated. May try prescription meds as directed and report if symptoms worsen or seek immediate care

## 2014-11-06 ENCOUNTER — Telehealth: Payer: Self-pay | Admitting: Family Medicine

## 2014-11-06 MED ORDER — VENLAFAXINE HCL 75 MG PO TABS: ORAL_TABLET | ORAL | Status: DC

## 2014-11-06 MED ORDER — LISINOPRIL 5 MG PO TABS: ORAL_TABLET | ORAL | Status: DC

## 2014-11-06 MED ORDER — CELECOXIB 200 MG PO CAPS: 200.0000 mg | ORAL_CAPSULE | Freq: Every day | ORAL | Status: DC

## 2014-11-06 NOTE — Telephone Encounter (Signed)
Refills done as requested and patient informed ?

## 2014-11-06 NOTE — Telephone Encounter (Signed)
Caller name: Mansa Relation to pt: self Call back number: [phone] [facility] [address]  Reason for call:   Needs refills of celebrex and lisinopril

## 2014-11-13 ENCOUNTER — Other Ambulatory Visit: Payer: Self-pay | Admitting: Family Medicine

## 2014-11-13 NOTE — Telephone Encounter (Signed)
Faxed hardcopy for Tr[facility]adol to [facility] [address]

## 2014-11-18 ENCOUNTER — Telehealth: Payer: Self-pay | Admitting: Family Medicine

## 2014-11-18 NOTE — Telephone Encounter (Signed)
Relation to pt: self  Call back number:  [phone] Pharmacy: [id number] [place] [place] [phone] (Phone) [phone] (Fax)         Reason for call:  Pt requesting a refill traMADol (ULTRAM) 50 MG tablet

## 2014-11-19 NOTE — Telephone Encounter (Signed)
OK to refill tramadol as long as it has been 30 days since lst prescription

## 2014-11-20 NOTE — Telephone Encounter (Signed)
Too soon for refill, it was printed on 4/28? Maybe it is at the front desk. He will have to wait til end of may for another prescription.

## 2014-11-20 NOTE — Telephone Encounter (Signed)
Last refill 11/13/14  #120 with 0 refills, Last OV 10/20/14 Advise if ok to refill at this time

## 2014-11-21 ENCOUNTER — Other Ambulatory Visit: Payer: Self-pay | Admitting: Family Medicine

## 2014-11-21 NOTE — Telephone Encounter (Signed)
Called the patient back informed we did [facility] to [facility] [address] on 11/13/14.  I did find hardcopy that I faxed along with fax confirmation from pharmacy that they did receive that fax.  The patient stated he would call them to inform and to check.

## 2014-11-24 ENCOUNTER — Telehealth: Payer: Self-pay | Admitting: Family Medicine

## 2014-11-24 NOTE — Telephone Encounter (Signed)
Last Office visit 10/20/14 Last refill for Valium 10/07/14  #30 with 0 refills Last refill for Tramadol 11/13/14  #120 with 0 refills (too soon?)  Advise on refill.

## 2014-11-24 NOTE — Telephone Encounter (Signed)
Relation to pt: self  Call back number: [phone]   Reason for call:  Pt requesting a refill diazepam (VALIUM) 10 MG tablet and traMADol (ULTRAM) 50 MG tablet

## 2014-11-24 NOTE — Telephone Encounter (Signed)
That is correct can have the Valium but needs to wait til end of month for Tramadol refill

## 2014-11-25 MED ORDER — DIAZEPAM 10 MG PO TABS: 10.0000 mg | ORAL_TABLET | Freq: Every evening | ORAL | Status: DC | PRN

## 2014-11-25 NOTE — Telephone Encounter (Signed)
Printed Valium refill on counter for PCP signature.

## 2014-11-25 NOTE — Telephone Encounter (Signed)
Faxed hardcopy for Valium #30 with 0 refills to [facility] [place]. Patient has been informed of refill instructions on Tramadol.  He is ok to wait until the end of the month.

## 2014-11-28 DIAGNOSIS — D225 Melanocytic nevi of trunk: Secondary | ICD-10-CM | POA: Diagnosis not present

## 2014-11-28 DIAGNOSIS — L814 Other melanin hyperpigmentation: Secondary | ICD-10-CM | POA: Diagnosis not present

## 2014-11-28 DIAGNOSIS — L821 Other seborrheic keratosis: Secondary | ICD-10-CM | POA: Diagnosis not present

## 2014-11-28 DIAGNOSIS — D485 Neoplasm of uncertain behavior of skin: Secondary | ICD-10-CM | POA: Diagnosis not present

## 2014-11-28 DIAGNOSIS — D1801 Hemangioma of skin and subcutaneous tissue: Secondary | ICD-10-CM | POA: Diagnosis not present

## 2014-11-28 DIAGNOSIS — B079 Viral wart, unspecified: Secondary | ICD-10-CM | POA: Diagnosis not present

## 2014-12-17 ENCOUNTER — Other Ambulatory Visit: Payer: Self-pay | Admitting: Family Medicine

## 2014-12-18 ENCOUNTER — Ambulatory Visit (INDEPENDENT_AMBULATORY_CARE_PROVIDER_SITE_OTHER): Payer: Medicare Other | Admitting: *Deleted

## 2014-12-18 DIAGNOSIS — E291 Testicular hypofunction: Secondary | ICD-10-CM | POA: Diagnosis not present

## 2014-12-18 MED ORDER — TESTOSTERONE CYPIONATE 200 MG/ML IM SOLN
100.0000 mg | Freq: Once | INTRAMUSCULAR | Status: AC
  Administered 2014-12-18: 100 mg via INTRAMUSCULAR

## 2014-12-18 NOTE — Progress Notes (Signed)
Pre visit review using our clinic review tool, if applicable. No additional management support is needed unless otherwise documented below in the visit note.  Patient tolerated injection well.  

## 2015-01-02 ENCOUNTER — Ambulatory Visit (INDEPENDENT_AMBULATORY_CARE_PROVIDER_SITE_OTHER): Payer: Medicare Other | Admitting: *Deleted

## 2015-01-02 DIAGNOSIS — E291 Testicular hypofunction: Secondary | ICD-10-CM

## 2015-01-02 MED ORDER — TESTOSTERONE CYPIONATE 100 MG/ML IM SOLN
100.0000 mg | Freq: Once | INTRAMUSCULAR | Status: AC
  Administered 2015-01-02: 100 mg via INTRAMUSCULAR

## 2015-01-02 NOTE — Progress Notes (Signed)
Pre visit review using our clinic review tool, if applicable. No additional management support is needed unless otherwise documented below in the visit note.  Patient tolerated injection well.  Per verbal order from Dr. [name] patient will need testosterone lab recheck scheduled between injections.   Lab appointment scheduled 01/09/15 and future lab ordered.

## 2015-01-04 ENCOUNTER — Other Ambulatory Visit: Payer: Self-pay | Admitting: Family Medicine

## 2015-01-07 ENCOUNTER — Telehealth: Payer: Self-pay | Admitting: Family Medicine

## 2015-01-07 NOTE — Telephone Encounter (Signed)
Caller name: Abdulwahab Relation to pt: Call back number: [phone] [facility] [address]  Reason for call:   Patient requesting a refill of diazepam and a med for his fever blister(cannot remember name)

## 2015-01-08 MED ORDER — ACYCLOVIR 200 MG PO CAPS: 200.0000 mg | ORAL_CAPSULE | Freq: Every day | ORAL | Status: DC

## 2015-01-08 MED ORDER — DIAZEPAM 10 MG PO TABS: 10.0000 mg | ORAL_TABLET | Freq: Every evening | ORAL | Status: DC | PRN

## 2015-01-08 NOTE — Telephone Encounter (Signed)
Last refill for Valium was on 11/25/14

## 2015-01-08 NOTE — Telephone Encounter (Signed)
He can have a refill on both the Diazepam with same sig, same strength and same number with 1 rf and the Acyclovir 200 mg caps 1 cap po 5 x daily, disp #25 with 1rf it is in his hold list

## 2015-01-08 NOTE — Telephone Encounter (Signed)
Printed is currently not printing prescriptions .  Attempted twice to print, but not successful, so phoned in Valium prescription to [facility].

## 2015-01-08 NOTE — Telephone Encounter (Signed)
Once prescription of Valium is printed will fax to [facility]

## 2015-01-09 ENCOUNTER — Other Ambulatory Visit (INDEPENDENT_AMBULATORY_CARE_PROVIDER_SITE_OTHER): Payer: Medicare Other

## 2015-01-09 DIAGNOSIS — I10 Essential (primary) hypertension: Secondary | ICD-10-CM | POA: Diagnosis not present

## 2015-01-09 DIAGNOSIS — E291 Testicular hypofunction: Secondary | ICD-10-CM

## 2015-01-09 DIAGNOSIS — E782 Mixed hyperlipidemia: Secondary | ICD-10-CM | POA: Diagnosis not present

## 2015-01-09 DIAGNOSIS — E669 Obesity, unspecified: Secondary | ICD-10-CM

## 2015-01-09 DIAGNOSIS — E119 Type 2 diabetes mellitus without complications: Secondary | ICD-10-CM | POA: Diagnosis not present

## 2015-01-09 DIAGNOSIS — R7989 Other specified abnormal findings of blood chemistry: Secondary | ICD-10-CM

## 2015-01-09 DIAGNOSIS — E1169 Type 2 diabetes mellitus with other specified complication: Secondary | ICD-10-CM

## 2015-01-09 LAB — COMPREHENSIVE METABOLIC PANEL
ALT: 14 U/L (ref 0–53)
AST: 23 U/L (ref 0–37)
Albumin: 4.2 g/dL (ref 3.5–5.2)
Alkaline Phosphatase: 74 U/L (ref 39–117)
BUN: 19 mg/dL (ref 6–23)
CO2: 31 mEq/L (ref 19–32)
Calcium: 9.4 mg/dL (ref 8.4–10.5)
Chloride: 103 mEq/L (ref 96–112)
Creatinine, Ser: 0.91 mg/dL (ref 0.40–1.50)
GFR: 87.2 mL/min (ref >60.00)
Glucose, Bld: 115 mg/dL — ABNORMAL HIGH (ref 70–99)
Potassium: 4 mEq/L (ref 3.5–5.1)
Sodium: 139 mEq/L (ref 135–145)
Total Bilirubin: 0.9 mg/dL (ref 0.2–1.2)
Total Protein: 6.8 g/dL (ref 6.0–8.3)

## 2015-01-09 LAB — TESTOSTERONE: Testosterone: 337.21 ng/dL (ref 300.00–890.00)

## 2015-01-09 LAB — CBC
HCT: 42.3 % (ref 39.0–52.0)
Hemoglobin: 14.6 g/dL (ref 13.0–17.0)
MCHC: 34.5 g/dL (ref 30.0–36.0)
MCV: 95.7 fl (ref 78.0–100.0)
Platelets: 314 10*3/uL (ref 150.0–400.0)
RBC: 4.42 Mil/uL (ref 4.22–5.81)
RDW: 13.8 % (ref 11.5–15.5)
WBC: 6.8 10*3/uL (ref 4.0–10.5)

## 2015-01-09 LAB — LIPID PANEL
Cholesterol: 142 mg/dL (ref 0–200)
HDL: 62.7 mg/dL (ref >39.00)
LDL Cholesterol: 65 mg/dL (ref 0–99)
NonHDL: 79.3
Total CHOL/HDL Ratio: 2
Triglycerides: 74 mg/dL (ref 0.0–149.0)
VLDL: 14.8 mg/dL (ref 0.0–40.0)

## 2015-01-09 LAB — HEMOGLOBIN A1C: Hgb A1c MFr Bld: 6.1 % (ref 4.6–6.5)

## 2015-01-09 LAB — TSH: TSH: 4.62 u[IU]/mL — ABNORMAL HIGH (ref 0.35–4.50)

## 2015-01-12 ENCOUNTER — Other Ambulatory Visit: Payer: Self-pay

## 2015-01-14 DIAGNOSIS — M4030 Flatback syndrome, site unspecified: Secondary | ICD-10-CM | POA: Diagnosis not present

## 2015-01-14 DIAGNOSIS — M4806 Spinal stenosis, lumbar region: Secondary | ICD-10-CM | POA: Diagnosis not present

## 2015-01-16 ENCOUNTER — Ambulatory Visit (INDEPENDENT_AMBULATORY_CARE_PROVIDER_SITE_OTHER): Payer: Medicare Other

## 2015-01-16 DIAGNOSIS — E291 Testicular hypofunction: Secondary | ICD-10-CM | POA: Diagnosis not present

## 2015-01-16 DIAGNOSIS — E349 Endocrine disorder, unspecified: Secondary | ICD-10-CM

## 2015-01-16 MED ORDER — TESTOSTERONE CYPIONATE 200 MG/ML IM SOLN: 200.0000 mg | Freq: Once | INTRAMUSCULAR | Status: DC

## 2015-01-20 ENCOUNTER — Telehealth: Payer: Self-pay | Admitting: Family Medicine

## 2015-01-20 NOTE — Telephone Encounter (Signed)
pre visit letter mailed 01/20/15 °

## 2015-01-26 ENCOUNTER — Ambulatory Visit: Payer: Medicare Other | Admitting: Family Medicine

## 2015-02-10 ENCOUNTER — Encounter: Payer: Self-pay | Admitting: Family Medicine

## 2015-02-10 ENCOUNTER — Ambulatory Visit (INDEPENDENT_AMBULATORY_CARE_PROVIDER_SITE_OTHER): Payer: Medicare Other | Admitting: Family Medicine

## 2015-02-10 VITALS — BP 128/70 | HR 86 | Temp 98.2°F | Ht 70.0 in | Wt 207.4 lb

## 2015-02-10 DIAGNOSIS — E785 Hyperlipidemia, unspecified: Secondary | ICD-10-CM

## 2015-02-10 DIAGNOSIS — K219 Gastro-esophageal reflux disease without esophagitis: Secondary | ICD-10-CM | POA: Insufficient documentation

## 2015-02-10 DIAGNOSIS — E349 Endocrine disorder, unspecified: Secondary | ICD-10-CM

## 2015-02-10 DIAGNOSIS — Z125 Encounter for screening for malignant neoplasm of prostate: Secondary | ICD-10-CM | POA: Diagnosis not present

## 2015-02-10 DIAGNOSIS — N400 Enlarged prostate without lower urinary tract symptoms: Secondary | ICD-10-CM

## 2015-02-10 DIAGNOSIS — I10 Essential (primary) hypertension: Secondary | ICD-10-CM

## 2015-02-10 DIAGNOSIS — Z0000 Encounter for general adult medical examination without abnormal findings: Secondary | ICD-10-CM

## 2015-02-10 DIAGNOSIS — R739 Hyperglycemia, unspecified: Secondary | ICD-10-CM

## 2015-02-10 HISTORY — DX: Encounter for general adult medical examination without abnormal findings: Z00.00

## 2015-02-10 HISTORY — DX: Gastro-esophageal reflux disease without esophagitis: K21.9

## 2015-02-10 LAB — PSA, MEDICARE: PSA: 0.24 ng/ml (ref 0.10–4.00)

## 2015-02-10 MED ORDER — RANITIDINE HCL 300 MG PO TABS: 300.0000 mg | ORAL_TABLET | Freq: Every day | ORAL | Status: DC

## 2015-02-10 NOTE — Assessment & Plan Note (Addendum)
Symptoms well controlled on meds. Check PSA today, WNL

## 2015-02-10 NOTE — Progress Notes (Signed)
Pre visit review using our clinic review tool, if applicable. No additional management support is needed unless otherwise documented below in the visit note. 

## 2015-02-10 NOTE — Patient Instructions (Addendum)
DASH Eating Plan DASH stands for "Dietary Approaches to Stop Hypertension." The DASH eating plan is a healthy eating plan that has been shown to reduce high blood pressure (hypertension). Additional health benefits may include reducing the risk of type 2 diabetes mellitus, heart disease, and stroke. The DASH eating plan may also help with weight loss. WHAT DO I NEED TO KNOW ABOUT THE DASH EATING PLAN? For the DASH eating plan, you will follow these general guidelines:  Choose foods with a percent daily value for sodium of less than 5% (as listed on the food label).  Use salt-free seasonings or herbs instead of table salt or sea salt.  Check with your health care provider or pharmacist before using salt substitutes.  Eat lower-sodium products, often labeled as "lower sodium" or "no salt added."  Eat fresh foods.  Eat more vegetables, fruits, and low-fat dairy products.  Choose whole grains. Look for the word "whole" as the first word in the ingredient list.  Choose fish and skinless chicken or [place] more often than red meat. Limit fish, poultry, and meat to 6 oz (170 g) each day.  Limit sweets, desserts, sugars, and sugary drinks.  Choose heart-healthy fats.  Limit cheese to 1 oz (28 g) per day.  Eat more home-cooked food and less restaurant, buffet, and fast food.  Limit fried foods.  Cook foods using methods other than frying.  Limit canned vegetables. If you do use them, rinse them well to decrease the sodium.  When eating at a restaurant, ask that your food be prepared with less salt, or no salt if possible. WHAT FOODS CAN I EAT? Seek help from a dietitian for individual calorie needs. Grains Whole grain or whole wheat bread. Brown rice. Whole grain or whole wheat pasta. Quinoa, bulgur, and whole grain cereals. Low-sodium cereals. Corn or whole wheat flour tortillas. Whole grain cornbread. Whole grain crackers. Low-sodium crackers. Vegetables Fresh or frozen vegetables  (raw, steamed, roasted, or grilled). Low-sodium or reduced-sodium tomato and vegetable juices. Low-sodium or reduced-sodium tomato sauce and paste. Low-sodium or reduced-sodium canned vegetables.  Fruits All fresh, canned (in natural juice), or frozen fruits. Meat and Other Protein Products Ground beef (85% or leaner), grass-fed beef, or beef trimmed of fat. Skinless chicken or [place]. Ground chicken or [place]. Pork trimmed of fat. All fish and seafood. Eggs. Dried beans, peas, or lentils. Unsalted nuts and seeds. Unsalted canned beans. Dairy Low-fat dairy products, such as skim or 1% milk, 2% or reduced-fat cheeses, low-fat ricotta or cottage cheese, or plain low-fat yogurt. Low-sodium or reduced-sodium cheeses. Fats and Oils Tub margarines without trans fats. Light or reduced-fat mayonnaise and salad dressings (reduced sodium). Avocado. Safflower, olive, or canola oils. Natural peanut or almond butter. Other Unsalted popcorn and pretzels. The items listed above may not be a complete list of recommended foods or beverages. Contact your dietitian for more options. WHAT FOODS ARE NOT RECOMMENDED? Grains White bread. White pasta. White rice. Refined cornbread. Bagels and croissants. Crackers that contain trans fat. Vegetables Creamed or fried vegetables. Vegetables in a cheese sauce. Regular canned vegetables. Regular canned tomato sauce and paste. Regular tomato and vegetable juices. Fruits Dried fruits. Canned fruit in light or heavy syrup. Fruit juice. Meat and Other Protein Products Fatty cuts of meat. Ribs, chicken wings, bacon, sausage, bologna, salami, chitterlings, fatback, hot dogs, bratwurst, and packaged luncheon meats. Salted nuts and seeds. Canned beans with salt. Dairy Whole or 2% milk, cream, half-and-half, and cream cheese. Whole-fat or sweetened yogurt. Full-fat  cheeses or blue cheese. Nondairy creamers and whipped toppings. Processed cheese, cheese spreads, or cheese  curds. Condiments Onion and garlic salt, seasoned salt, table salt, and sea salt. Canned and packaged gravies. Worcestershire sauce. Tartar sauce. Barbecue sauce. Teriyaki sauce. Soy sauce, including reduced sodium. Steak sauce. Fish sauce. Oyster sauce. Cocktail sauce. Horseradish. Ketchup and mustard. Meat flavorings and tenderizers. Bouillon cubes. Hot sauce. Tabasco sauce. Marinades. Taco seasonings. Relishes. Fats and Oils Butter, stick margarine, lard, shortening, ghee, and bacon fat. Coconut, palm kernel, or palm oils. Regular salad dressings. Other Pickles and olives. Salted popcorn and pretzels. The items listed above may not be a complete list of foods and beverages to avoid. Contact your dietitian for more information. WHERE CAN I FIND MORE INFORMATION? National Heart, Lung, and Blood Institute: travelstabloid.com Document Released: 06/23/2011 Document Revised: 11/18/2013 Document Reviewed: 05/08/2013 [facility] Patient Information 2015 [place], [place]. This information is not intended to replace advice given to you by your health care provider. Make sure you discuss any questions you have with your health care provider.   Preventive Care for Adults A healthy lifestyle and preventive care can promote health and wellness. Preventive health guidelines for men include the following key practices:  A routine yearly physical is a good way to check with your health care provider about your health and preventative screening. It is a chance to share any concerns and updates on your health and to receive a thorough exam.  Visit your dentist for a routine exam and preventative care every 6 months. Brush your teeth twice a day and floss once a day. Good oral hygiene prevents tooth decay and gum disease.  The frequency of eye exams is based on your age, health, family medical history, use of contact lenses, and other factors. Follow your health care provider's  recommendations for frequency of eye exams.  Eat a healthy diet. Foods such as vegetables, fruits, whole grains, low-fat dairy products, and lean protein foods contain the nutrients you need without too many calories. Decrease your intake of foods high in solid fats, added sugars, and salt. Eat the right amount of calories for you.Get information about a proper diet from your health care provider, if necessary.  Regular physical exercise is one of the most important things you can do for your health. Most adults should get at least 150 minutes of moderate-intensity exercise (any activity that increases your heart rate and causes you to sweat) each week. In addition, most adults need muscle-strengthening exercises on 2 or more days a week.  Maintain a healthy weight. The body mass index (BMI) is a screening tool to identify possible weight problems. It provides an estimate of body fat based on height and weight. Your health care provider can find your BMI and can help you achieve or maintain a healthy weight.For adults 20 years and older:  A BMI below 18.5 is considered underweight.  A BMI of 18.5 to 24.9 is normal.  A BMI of 25 to 29.9 is considered overweight.  A BMI of 30 and above is considered obese.  Maintain normal blood lipids and cholesterol levels by exercising and minimizing your intake of saturated fat. Eat a balanced diet with plenty of fruit and vegetables. Blood tests for lipids and cholesterol should begin at age 88 and be repeated every 5 years. If your lipid or cholesterol levels are high, you are over 50, or you are at high risk for heart disease, you may need your cholesterol levels checked more frequently.Ongoing high lipid  and cholesterol levels should be treated with medicines if diet and exercise are not working.  If you smoke, find out from your health care provider how to quit. If you do not use tobacco, do not start.  Lung cancer screening is recommended for adults  aged 9-80 years who are at high risk for developing lung cancer because of a history of smoking. A yearly low-dose CT scan of the lungs is recommended for people who have at least a 30-pack-year history of smoking and are a current smoker or have quit within the past 15 years. A pack year of smoking is smoking an average of 1 pack of cigarettes a day for 1 year (for example: 1 pack a day for 30 years or 2 packs a day for 15 years). Yearly screening should continue until the smoker has stopped smoking for at least 15 years. Yearly screening should be stopped for people who develop a health problem that would prevent them from having lung cancer treatment.  If you choose to drink alcohol, do not have more than 2 drinks per day. One drink is considered to be 12 ounces (355 mL) of beer, 5 ounces (148 mL) of wine, or 1.5 ounces (44 mL) of liquor.  Avoid use of street drugs. Do not share needles with anyone. Ask for help if you need support or instructions about stopping the use of drugs.  High blood pressure causes heart disease and increases the risk of stroke. Your blood pressure should be checked at least every 1-2 years. Ongoing high blood pressure should be treated with medicines, if weight loss and exercise are not effective.  If you are 35-37 years old, ask your health care provider if you should take aspirin to prevent heart disease.  Diabetes screening involves taking a blood sample to check your fasting blood sugar level. This should be done once every 3 years, after age 50, if you are within normal weight and without risk factors for diabetes. Testing should be considered at a younger age or be carried out more frequently if you are overweight and have at least 1 risk factor for diabetes.  Colorectal cancer can be detected and often prevented. Most routine colorectal cancer screening begins at the age of 56 and continues through age 76. However, your health care provider may recommend screening at  an earlier age if you have risk factors for colon cancer. On a yearly basis, your health care provider may provide home test kits to check for hidden blood in the stool. Use of a small camera at the end of a tube to directly examine the colon (sigmoidoscopy or colonoscopy) can detect the earliest forms of colorectal cancer. Talk to your health care provider about this at age 20, when routine screening begins. Direct exam of the colon should be repeated every 5-10 years through age 30, unless early forms of precancerous polyps or small growths are found.  People who are at an increased risk for hepatitis B should be screened for this virus. You are considered at high risk for hepatitis B if:  You were born in a country where hepatitis B occurs often. Talk with your health care provider about which countries are considered high risk.  Your parents were born in a high-risk country and you have not received a shot to protect against hepatitis B (hepatitis B vaccine).  You have HIV or AIDS.  You use needles to inject street drugs.  You live with, or have sex with, someone who  has hepatitis B.  You are a man who has sex with other men (MSM).  You get hemodialysis treatment.  You take certain medicines for conditions such as cancer, organ transplantation, and autoimmune conditions.  Hepatitis C blood testing is recommended for all people born from [birth year] through 1965 and any individual with known risks for hepatitis C.  Practice safe sex. Use condoms and avoid high-risk sexual practices to reduce the spread of sexually transmitted infections (STIs). STIs include gonorrhea, chlamydia, syphilis, trichomonas, herpes, HPV, and human immunodeficiency virus (HIV). Herpes, HIV, and HPV are viral illnesses that have no cure. They can result in disability, cancer, and death.  If you are at risk of being infected with HIV, it is recommended that you take a prescription medicine daily to prevent HIV infection.  This is called preexposure prophylaxis (PrEP). You are considered at risk if:  You are a man who has sex with other men (MSM) and have other risk factors.  You are a heterosexual man, are sexually active, and are at increased risk for HIV infection.  You take drugs by injection.  You are sexually active with a partner who has HIV.  Talk with your health care provider about whether you are at high risk of being infected with HIV. If you choose to begin PrEP, you should first be tested for HIV. You should then be tested every 3 months for as long as you are taking PrEP.  A one-time screening for abdominal aortic aneurysm (AAA) and surgical repair of large AAAs by ultrasound are recommended for men ages 3 to 42 years who are current or former smokers.  Healthy men should no longer receive prostate-specific antigen (PSA) blood tests as part of routine cancer screening. Talk with your health care provider about prostate cancer screening.  Testicular cancer screening is not recommended for adult males who have no symptoms. Screening includes self-exam, a health care provider exam, and other screening tests. Consult with your health care provider about any symptoms you have or any concerns you have about testicular cancer.  Use sunscreen. Apply sunscreen liberally and repeatedly throughout the day. You should seek shade when your shadow is shorter than you. Protect yourself by wearing long sleeves, pants, a wide-brimmed hat, and sunglasses year round, whenever you are outdoors.  Once a month, do a whole-body skin exam, using a mirror to look at the skin on your back. Tell your health care provider about new moles, moles that have irregular borders, moles that are larger than a pencil eraser, or moles that have changed in shape or color.  Stay current with required vaccines (immunizations).  Influenza vaccine. All adults should be immunized every year.  Tetanus, diphtheria, and acellular pertussis  (Td, Tdap) vaccine. An adult who has not previously received Tdap or who does not know his vaccine status should receive 1 dose of Tdap. This initial dose should be followed by tetanus and diphtheria toxoids (Td) booster doses every 10 years. Adults with an unknown or incomplete history of completing a 3-dose immunization series with Td-containing vaccines should begin or complete a primary immunization series including a Tdap dose. Adults should receive a Td booster every 10 years.  Varicella vaccine. An adult without evidence of immunity to varicella should receive 2 doses or a second dose if he has previously received 1 dose.  Human papillomavirus (HPV) vaccine. Males aged 32-21 years who have not received the vaccine previously should receive the 3-dose series. Males aged 22-26 years may be immunized.  Immunization is recommended through the age of 80 years for any male who has sex with males and did not get any or all doses earlier. Immunization is recommended for any person with an immunocompromised condition through the age of 72 years if he did not get any or all doses earlier. During the 3-dose series, the second dose should be obtained 4-8 weeks after the first dose. The third dose should be obtained 24 weeks after the first dose and 16 weeks after the second dose.  Zoster vaccine. One dose is recommended for adults aged 57 years or older unless certain conditions are present.  Measles, mumps, and rubella (MMR) vaccine. Adults born before [birth year] generally are considered immune to measles and mumps. Adults born in [birth year] or later should have 1 or more doses of MMR vaccine unless there is a contraindication to the vaccine or there is laboratory evidence of immunity to each of the three diseases. A routine second dose of MMR vaccine should be obtained at least 28 days after the first dose for students attending postsecondary schools, health care workers, or international travelers. People who received  inactivated measles vaccine or an unknown type of measles vaccine during 1963-1967 should receive 2 doses of MMR vaccine. People who received inactivated mumps vaccine or an unknown type of mumps vaccine before 1979 and are at high risk for mumps infection should consider immunization with 2 doses of MMR vaccine. Unvaccinated health care workers born before [birth year] who lack laboratory evidence of measles, mumps, or rubella immunity or laboratory confirmation of disease should consider measles and mumps immunization with 2 doses of MMR vaccine or rubella immunization with 1 dose of MMR vaccine.  Pneumococcal 13-valent conjugate (PCV13) vaccine. When indicated, a person who is uncertain of his immunization history and has no record of immunization should receive the PCV13 vaccine. An adult aged 79 years or older who has certain medical conditions and has not been previously immunized should receive 1 dose of PCV13 vaccine. This PCV13 should be followed with a dose of pneumococcal polysaccharide (PPSV23) vaccine. The PPSV23 vaccine dose should be obtained at least 8 weeks after the dose of PCV13 vaccine. An adult aged 22 years or older who has certain medical conditions and previously received 1 or more doses of PPSV23 vaccine should receive 1 dose of PCV13. The PCV13 vaccine dose should be obtained 1 or more years after the last PPSV23 vaccine dose.  Pneumococcal polysaccharide (PPSV23) vaccine. When PCV13 is also indicated, PCV13 should be obtained first. All adults aged 87 years and older should be immunized. An adult younger than age 78 years who has certain medical conditions should be immunized. Any person who resides in a nursing home or long-term care facility should be immunized. An adult smoker should be immunized. People with an immunocompromised condition and certain other conditions should receive both PCV13 and PPSV23 vaccines. People with human immunodeficiency virus (HIV) infection should be immunized  as soon as possible after diagnosis. Immunization during chemotherapy or radiation therapy should be avoided. Routine use of PPSV23 vaccine is not recommended for American Indians, [place] Natives, or people younger than 65 years unless there are medical conditions that require PPSV23 vaccine. When indicated, people who have unknown immunization and have no record of immunization should receive PPSV23 vaccine. One-time revaccination 5 years after the first dose of PPSV23 is recommended for people aged 19-64 years who have chronic kidney failure, nephrotic syndrome, asplenia, or immunocompromised conditions. People who received 1-2 doses of PPSV23 before  age 41 years should receive another dose of PPSV23 vaccine at age 39 years or later if at least 5 years have passed since the previous dose. Doses of PPSV23 are not needed for people immunized with PPSV23 at or after age 33 years.  Meningococcal vaccine. Adults with asplenia or persistent complement component deficiencies should receive 2 doses of quadrivalent meningococcal conjugate (MenACWY-D) vaccine. The doses should be obtained at least 2 months apart. Microbiologists working with certain meningococcal bacteria, [place] recruits, people at risk during an outbreak, and people who travel to or live in countries with a high rate of meningitis should be immunized. A first-year college student up through age 86 years who is living in a residence hall should receive a dose if he did not receive a dose on or after his 16th birthday. Adults who have certain high-risk conditions should receive one or more doses of vaccine.  Hepatitis A vaccine. Adults who wish to be protected from this disease, have certain high-risk conditions, work with hepatitis A-infected animals, work in hepatitis A research labs, or travel to or work in countries with a high rate of hepatitis A should be immunized. Adults who were previously unvaccinated and who anticipate close contact with an  international adoptee during the first 60 days after arrival in the [place] from a country with a high rate of hepatitis A should be immunized.  Hepatitis B vaccine. Adults should be immunized if they wish to be protected from this disease, have certain high-risk conditions, may be exposed to blood or other infectious body fluids, are household contacts or sex partners of hepatitis B positive people, are clients or workers in certain care facilities, or travel to or work in countries with a high rate of hepatitis B.  Haemophilus influenzae type b (Hib) vaccine. A previously unvaccinated person with asplenia or sickle cell disease or having a scheduled splenectomy should receive 1 dose of Hib vaccine. Regardless of previous immunization, a recipient of a hematopoietic stem cell transplant should receive a 3-dose series 6-12 months after his successful transplant. Hib vaccine is not recommended for adults with HIV infection. Preventive Service / Frequency Ages 17 to 74  Blood pressure check.** / Every 1 to 2 years.  Lipid and cholesterol check.** / Every 5 years beginning at age 43.  Hepatitis C blood test.** / For any individual with known risks for hepatitis C.  Skin self-exam. / Monthly.  Influenza vaccine. / Every year.  Tetanus, diphtheria, and acellular pertussis (Tdap, Td) vaccine.** / Consult your health care provider. 1 dose of Td every 10 years.  Varicella vaccine.** / Consult your health care provider.  HPV vaccine. / 3 doses over 6 months, if 45 or younger.  Measles, mumps, rubella (MMR) vaccine.** / You need at least 1 dose of MMR if you were born in 1957 or later. You may also need a second dose.  Pneumococcal 13-valent conjugate (PCV13) vaccine.** / Consult your health care provider.  Pneumococcal polysaccharide (PPSV23) vaccine.** / 1 to 2 doses if you smoke cigarettes or if you have certain conditions.  Meningococcal vaccine.** / 1 dose if you are age 12 to 31 years  and a Market researcher living in a residence hall, or have one of several medical conditions. You may also need additional booster doses.  Hepatitis A vaccine.** / Consult your health care provider.  Hepatitis B vaccine.** / Consult your health care provider.  Haemophilus influenzae type b (Hib) vaccine.** / Consult your health care provider. Ages  40 to 64  Blood pressure check.** / Every 1 to 2 years.  Lipid and cholesterol check.** / Every 5 years beginning at age 66.  Lung cancer screening. / Every year if you are aged 7-80 years and have a 30-pack-year history of smoking and currently smoke or have quit within the past 15 years. Yearly screening is stopped once you have quit smoking for at least 15 years or develop a health problem that would prevent you from having lung cancer treatment.  Fecal occult blood test (FOBT) of stool. / Every year beginning at age 24 and continuing until age 90. You may not have to do this test if you get a colonoscopy every 10 years.  Flexible sigmoidoscopy** or colonoscopy.** / Every 5 years for a flexible sigmoidoscopy or every 10 years for a colonoscopy beginning at age 31 and continuing until age 45.  Hepatitis C blood test.** / For all people born from [birth year] through 1965 and any individual with known risks for hepatitis C.  Skin self-exam. / Monthly.  Influenza vaccine. / Every year.  Tetanus, diphtheria, and acellular pertussis (Tdap/Td) vaccine.** / Consult your health care provider. 1 dose of Td every 10 years.  Varicella vaccine.** / Consult your health care provider.  Zoster vaccine.** / 1 dose for adults aged 4 years or older.  Measles, mumps, rubella (MMR) vaccine.** / You need at least 1 dose of MMR if you were born in 1957 or later. You may also need a second dose.  Pneumococcal 13-valent conjugate (PCV13) vaccine.** / Consult your health care provider.  Pneumococcal polysaccharide (PPSV23) vaccine.** / 1 to 2 doses if you  smoke cigarettes or if you have certain conditions.  Meningococcal vaccine.** / Consult your health care provider.  Hepatitis A vaccine.** / Consult your health care provider.  Hepatitis B vaccine.** / Consult your health care provider.  Haemophilus influenzae type b (Hib) vaccine.** / Consult your health care provider. Ages 3 and over  Blood pressure check.** / Every 1 to 2 years.  Lipid and cholesterol check.**/ Every 5 years beginning at age 65.  Lung cancer screening. / Every year if you are aged 46-80 years and have a 30-pack-year history of smoking and currently smoke or have quit within the past 15 years. Yearly screening is stopped once you have quit smoking for at least 15 years or develop a health problem that would prevent you from having lung cancer treatment.  Fecal occult blood test (FOBT) of stool. / Every year beginning at age 61 and continuing until age 84. You may not have to do this test if you get a colonoscopy every 10 years.  Flexible sigmoidoscopy** or colonoscopy.** / Every 5 years for a flexible sigmoidoscopy or every 10 years for a colonoscopy beginning at age 69 and continuing until age 63.  Hepatitis C blood test.** / For all people born from [birth year] through 1967 and any individual with known risks for hepatitis C.  Abdominal aortic aneurysm (AAA) screening.** / A one-time screening for ages 80 to 30 years who are current or former smokers.  Skin self-exam. / Monthly.  Influenza vaccine. / Every year.  Tetanus, diphtheria, and acellular pertussis (Tdap/Td) vaccine.** / 1 dose of Td every 10 years.  Varicella vaccine.** / Consult your health care provider.  Zoster vaccine.** / 1 dose for adults aged 94 years or older.  Pneumococcal 13-valent conjugate (PCV13) vaccine.** / Consult your health care provider.  Pneumococcal polysaccharide (PPSV23) vaccine.** / 1 dose for all adults aged  29 years and older.  Meningococcal vaccine.** / Consult your health  care provider.  Hepatitis A vaccine.** / Consult your health care provider.  Hepatitis B vaccine.** / Consult your health care provider.  Haemophilus influenzae type b (Hib) vaccine.** / Consult your health care provider. **Family history and personal history of risk and conditions may change your health care provider's recommendations. Document Released: 08/30/2001 Document Revised: 07/09/2013 Document Reviewed: 11/29/2010 [facility] Patient Information 2015 [place], [place]. This information is not intended to replace advice given to you by your health care provider. Make sure you discuss any questions you have with your health care provider.

## 2015-02-10 NOTE — Assessment & Plan Note (Signed)
Well controlled, no changes to meds. Encouraged heart healthy diet such as the DASH diet and exercise as tolerated.  °

## 2015-02-10 NOTE — Assessment & Plan Note (Signed)
Well controlled on Venlafaxine, 1/2 tab po bid

## 2015-02-10 NOTE — Assessment & Plan Note (Signed)
Encouraged DASH diet, decrease po intake and increase exercise as tolerated. Needs 7-8 hours of sleep nightly. Avoid trans fats, eat small, frequent meals every 4-5 hours with lean proteins, complex carbs and healthy fats. Minimize simple carbs 

## 2015-02-10 NOTE — Progress Notes (Signed)
Luis Brown  [id number] Jan 31, 1946 02/10/2015      Progress Note-Follow Up  Subjective  Chief Complaint  Chief Complaint  Patient presents with  . Annual Exam    HPI  Patient is a 69 y.o. male in today for routine medical care. He is here for follow-up on numerous concerns and annual  Wellness visit. He has been struggling with an upper respiratory infection but his symptoms are improved. No incontinence but he does note decreased ROM secondary to low back injury in the past with roughly 16 screws in place. No recent or acute illness. Denies CP/palp/SOB/HA/congestion/fevers/GI or GU c/o. Taking meds as prescribed Past Medical History  Diagnosis Date  . Chicken pox as a child  . Measles as a child  . Allergy     grass and pollen  . DDD (degenerative disc disease), lumbosacral   . DDD (degenerative disc disease)     cervical responds to steroid injections and low back required surgery  . Elevated BP   . Asthma     exercise induced  . Diabetes mellitus     pre  . Hyperlipidemia   . Overweight(278.02)   . Insomnia   . Fatigue   . Preventative health care   . Anxiety and depression 10/25/2011  . BPH (benign prostatic hyperplasia) 04/23/2012  . ED (erectile dysfunction) 04/23/2012  . HTN (hypertension)   . Hyperglycemia     preDM   . 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  . Personal history of colonic polyps 10/27/2012    Follows with [facility]  . Acute pharyngitis 10/21/2013    Past Surgical History  Procedure Laterality Date  . Back surgery  2012 and 1994    Dr Margret Chance, screws and cage in low back  . Torn rotator cuff  2010    right  . Tonsillectomy      Family History  Problem Relation Age of Onset  . Hypertension Mother   . Diabetes Mother     type 2  . Cancer Mother 86    breast in remission  . Emphysema Father     smoker  . COPD Father     smoker  . Stroke Father 41    mini  . Heart disease Father   . Hypertension Sister   .  Hyperlipidemia Sister   . Scoliosis Sister   . Osteoporosis Sister   . Hypertension Maternal Grandmother   . Scoliosis Maternal Grandmother   . Heart disease Maternal Grandfather   . Heart disease Paternal Grandfather     smoker    History   Social History  . Marital Status: Married    Spouse Name: N/A  . Number of Children: N/A  . Years of Education: N/A   Occupational History  . Not on file.   Social History Main Topics  . Smoking status: Former Smoker -- 1.00 packs/day for 20 years    Types: Cigarettes    Quit date: 07/18/1989  . Smokeless tobacco: Never Used     Comment: off and on smoking for the 20 yrs  . Alcohol Use: Yes     Comment: drink or two a day- mixed drink  . Drug Use: No  . Sexual Activity: Yes   Other Topics Concern  . Not on file   Social History Narrative    Current Outpatient Prescriptions on File Prior to Visit  Medication Sig Dispense Refill  . aspirin 81 MG tablet Take 81 mg by mouth daily.    Marland Kitchen  atorvastatin (LIPITOR) 20 MG tablet TAKE 1/2 A TABLET BY MOUTH EVERY DAY AT 6PM 45 tablet 2  . atorvastatin (LIPITOR) 20 MG tablet TAKE 1/2 A TABLET BY MOUTH EVERY DAY AT 6PM 45 tablet 0  . atorvastatin (LIPITOR) 20 MG tablet TAKE 1/2 A TABLET BY MOUTH EVERY DAY AT 6PM 45 tablet 0  . b complex vitamins tablet Take 1 tablet by mouth daily.    . celecoxib (CELEBREX) 200 MG capsule Take 1 capsule (200 mg total) by mouth daily. 90 capsule 1  . diazepam (VALIUM) 10 MG tablet Take 1 tablet (10 mg total) by mouth at bedtime as needed. for sleep 30 tablet 1  . ECHINACEA PLUS CAPS Take 1 capsule by mouth as needed. 750 mg    . glucose blood (ONE TOUCH ULTRA TEST) test strip Check daily 32 each 1  . lisinopril (PRINIVIL,ZESTRIL) 5 MG tablet TAKE 1 TABLET BY MOUTH EVERY DAY 90 tablet 1  . metFORMIN (GLUCOPHAGE) 500 MG tablet TAKE 1 TABLET BY MOUTH EVERY DAY 90 tablet 0  . Multiple Vitamin (MULTIVITAMIN) tablet Take 1 tablet by mouth daily.    . sildenafil  (VIAGRA) 100 MG tablet Take 1/2 tab to 1 tab po daily prn ED 10 tablet 2  . tamsulosin (FLOMAX) 0.4 MG CAPS capsule TAKE 2 CAPSULES EVERY DAY 180 capsule 2  . testosterone cypionate (DEPOTESTOTERONE CYPIONATE) 200 MG/ML injection INJECT 0.5 MLS INTO THE MUSCLE EVERY 14 DAYS (Patient taking differently: Inject 200 mg into the muscle. INJECT 0.5 MLS INTO THE MUSCLE EVERY 14 DAYS) 10 mL 1  . traMADol (ULTRAM) 50 MG tablet TAKE 1/2 TO 1 TABLET BY MOUTH 4 TIMES A DAY AS NEEDED FOR PAIN 120 tablet 0  . venlafaxine (EFFEXOR) 75 MG tablet TAKE 1 TABLET BY MOUTH EVERY DAY AT 6AM 90 tablet 1  . cetirizine (ZYRTEC) 10 MG tablet Take 1 tablet (10 mg total) by mouth daily as needed for allergies or rhinitis. 30 tablet 11  . fluticasone (FLONASE) 50 MCG/ACT nasal spray Place 2 sprays into the nose daily. 48 g 1   Current Facility-Administered Medications on File Prior to Visit  Medication Dose Route Frequency Provider Last Rate Last Dose  . testosterone cypionate (DEPOTESTOSTERONE CYPIONATE) injection 200 mg  200 mg Intramuscular Once [name], MD        No Known Allergies  Review of Systems  Review of Systems  Constitutional: Negative for fever, chills and malaise/fatigue.  HENT: Negative for congestion, hearing loss and nosebleeds.   Eyes: Negative for discharge.  Respiratory: Negative for cough, sputum production, shortness of breath and wheezing.   Cardiovascular: Negative for chest pain, palpitations and leg swelling.  Gastrointestinal: Negative for heartburn, nausea, vomiting, abdominal pain, diarrhea, constipation and blood in stool.  Genitourinary: Negative for dysuria, urgency, frequency and hematuria.  Musculoskeletal: Negative for myalgias, back pain and falls.  Skin: Negative for rash.  Neurological: Negative for dizziness, tremors, sensory change, focal weakness, loss of consciousness, weakness and headaches.  Endo/Heme/Allergies: Negative for polydipsia. Does not bruise/bleed  easily.  Psychiatric/Behavioral: Positive for depression. Negative for suicidal ideas. The patient is not nervous/anxious and does not have insomnia.        Depression right after surgery but is improved now    Objective  BP 128/70 mmHg  Pulse 86  Temp(Src) 98.2 F (36.8 C) (Oral)  Ht 5\' 10"  (1.778 m)  Wt 207 lb 6 oz (94.065 kg)  BMI 29.76 kg/m2  SpO2 96%  Physical Exam  Physical Exam  Constitutional: He is oriented to person, place, and time and well-developed, well-nourished, and in no distress. No distress.  HENT:  Head: Normocephalic and atraumatic.  Eyes: Conjunctivae are normal.  Neck: Neck supple. No thyromegaly present.  Cardiovascular: Normal rate, regular rhythm and normal heart sounds.   No murmur heard. Pulmonary/Chest: Effort normal and breath sounds normal. No respiratory distress.  Abdominal: Soft. Bowel sounds are normal. He exhibits no distension and no mass. There is no tenderness.  Musculoskeletal: He exhibits no edema.  Decreased ROB Lumbar spine s/p low back surgery  Neurological: He is alert and oriented to person, place, and time. He has normal reflexes. Gait normal.  Skin: Skin is warm.  Psychiatric: Memory, affect and judgment normal.    Lab Results  Component Value Date   TSH 4.62* 01/09/2015   Lab Results  Component Value Date   WBC 6.8 01/09/2015   HGB 14.6 01/09/2015   HCT 42.3 01/09/2015   MCV 95.7 01/09/2015   PLT 314.0 01/09/2015   Lab Results  Component Value Date   CREATININE 0.91 01/09/2015   BUN 19 01/09/2015   NA 139 01/09/2015   K 4.0 01/09/2015   CL 103 01/09/2015   CO2 31 01/09/2015   Lab Results  Component Value Date   ALT 14 01/09/2015   AST 23 01/09/2015   ALKPHOS 74 01/09/2015   BILITOT 0.9 01/09/2015   Lab Results  Component Value Date   CHOL 142 01/09/2015   Lab Results  Component Value Date   HDL 62.70 01/09/2015   Lab Results  Component Value Date   LDLCALC 65 01/09/2015   Lab Results    Component Value Date   TRIG 74.0 01/09/2015   Lab Results  Component Value Date   CHOLHDL 2 01/09/2015     Assessment & Plan  Overweight Encouraged DASH diet, decrease po intake and increase exercise as tolerated. Needs 7-8 hours of sleep nightly. Avoid trans fats, eat small, frequent meals every 4-5 hours with lean proteins, complex carbs and healthy fats. Minimize simple carbs  HTN (hypertension) Well controlled, no changes to meds. Encouraged heart healthy diet such as the DASH diet and exercise as tolerated.   BPH (benign prostatic hyperplasia) Symptoms well controlled on meds. Check PSA today, WNL  Anxiety and depression Well controlled on Venlafaxine, 1/2 tab po bid  Medicare annual wellness visit, subsequent Patient denies any difficulties at home. No trouble with ADLs, depression or falls. No recent changes to vision or hearing. Is UTD with immunizations. Is UTD with screening. Discussed Advanced Directives, patient agrees to bring [place] copies of documents if can. Encouraged heart healthy diet, exercise as tolerated and adequate sleep. Labs reviewed. Dr Wilford Corner, [facility], colonoscopy UTD, repeat in 10 years, last Feb 2014 Dr Louanne Skye and Dr Agapito Games neurosurgery Sees Opthamology, Dr Quay Burow Dr Sandi Carne, dentist See problem list for risk factors See AVS for recommended preventative screening  Hyperlipidemia Tolerating statin, encouraged heart healthy diet, avoid trans fats, minimize simple carbs and saturated fats. Increase exercise as tolerated  Hyperglycemia hgba1c acceptable, minimize simple carbs. Increase exercise as tolerated.   Esophageal reflux Avoid offending foods, take probiotics. Do not eat large meals in late evening and consider raising head of bed.

## 2015-02-16 ENCOUNTER — Other Ambulatory Visit: Payer: Self-pay | Admitting: Family Medicine

## 2015-02-22 NOTE — Assessment & Plan Note (Signed)
Avoid offending foods, take probiotics. Do not eat large meals in late evening and consider raising head of bed.  

## 2015-02-22 NOTE — Assessment & Plan Note (Addendum)
Patient denies any difficulties at home. No trouble with ADLs, depression or falls. No recent changes to vision or hearing. Is UTD with immunizations. Is UTD with screening. Discussed Advanced Directives, patient agrees to bring [place] copies of documents if can. Encouraged heart healthy diet, exercise as tolerated and adequate sleep. Labs reviewed. Dr Wilford Corner, [facility], colonoscopy UTD, repeat in 10 years, last Feb 2014 Dr Louanne Skye and Dr Agapito Games neurosurgery Sees Opthamology, Dr Quay Burow Dr Sandi Carne, dentist See problem list for risk factors See AVS for recommended preventative screening

## 2015-02-22 NOTE — Assessment & Plan Note (Signed)
hgba1c acceptable, minimize simple carbs. Increase exercise as tolerated.  

## 2015-02-22 NOTE — Assessment & Plan Note (Signed)
Tolerating statin, encouraged heart healthy diet, avoid trans fats, minimize simple carbs and saturated fats. Increase exercise as tolerated 

## 2015-03-27 ENCOUNTER — Other Ambulatory Visit: Payer: Self-pay | Admitting: Family Medicine

## 2015-03-27 NOTE — Telephone Encounter (Signed)
Requesting:  testosterone Contract  Signed on 10/20/14 UDS   none Last OV    02/10/15 Last Refill   09/03/14  10 ML with 1 refill  Please Advise

## 2015-03-30 NOTE — Telephone Encounter (Signed)
[facility] [address], patient notified.

## 2015-04-28 ENCOUNTER — Other Ambulatory Visit: Payer: Self-pay | Admitting: Family Medicine

## 2015-04-28 NOTE — Telephone Encounter (Signed)
Faxed hardcopy [facility] Valium to [facility] [place].

## 2015-04-28 NOTE — Telephone Encounter (Signed)
Requesting:  Valium Contract   10/20/14 UDS   None Last OV    7.26.16 Last Refill   #30 with 1 refill 01/08/15  Please Advise

## 2015-05-08 ENCOUNTER — Other Ambulatory Visit: Payer: Self-pay | Admitting: Family Medicine

## 2015-05-19 ENCOUNTER — Other Ambulatory Visit: Payer: Self-pay | Admitting: Family Medicine

## 2015-05-29 ENCOUNTER — Other Ambulatory Visit: Payer: Self-pay | Admitting: Family Medicine

## 2015-06-19 ENCOUNTER — Other Ambulatory Visit: Payer: Self-pay | Admitting: Family Medicine

## 2015-06-28 ENCOUNTER — Other Ambulatory Visit: Payer: Self-pay | Admitting: Family Medicine

## 2015-07-22 ENCOUNTER — Other Ambulatory Visit: Payer: Self-pay | Admitting: Family Medicine

## 2015-07-22 MED ORDER — RANITIDINE HCL 300 MG PO TABS: 300.0000 mg | ORAL_TABLET | Freq: Every day | ORAL | Status: DC

## 2015-08-10 ENCOUNTER — Other Ambulatory Visit: Payer: Self-pay | Admitting: Family Medicine

## 2015-08-12 DIAGNOSIS — M4030 Flatback syndrome, site unspecified: Secondary | ICD-10-CM | POA: Diagnosis not present

## 2015-08-12 DIAGNOSIS — M549 Dorsalgia, unspecified: Secondary | ICD-10-CM | POA: Diagnosis not present

## 2015-08-18 ENCOUNTER — Other Ambulatory Visit: Payer: Self-pay | Admitting: Family Medicine

## 2015-08-18 NOTE — Telephone Encounter (Signed)
Requesting: Valium Contract  10/20/2014 UDS None Last OV  02/10/2015 Last Refill  04/28/2015 #30 with 1 refills  Please Advise

## 2015-08-18 NOTE — Telephone Encounter (Signed)
[facility] [address] 

## 2015-08-18 NOTE — Telephone Encounter (Signed)
Faxed hardcopy for Diazepam to

## 2015-08-24 ENCOUNTER — Other Ambulatory Visit: Payer: Self-pay | Admitting: Family Medicine

## 2015-09-04 ENCOUNTER — Telehealth: Payer: Self-pay | Admitting: Family Medicine

## 2015-09-04 NOTE — Telephone Encounter (Signed)
Caller name: Self  Can be reached: [phone]   Reason for call: Patient states that he gets DEPOTESTOTERONE CYPIONATE injections every two weeks. States he has not been here since last year in July for an injection because his daughter in-law Investment banker, corporate) has been giving them to him. Wants to come in to get one next week because she is out of town and not available to give him the injection. States that he still has the medication

## 2015-09-06 NOTE — Telephone Encounter (Signed)
He is welcome to come in for a shot but he also needs to get his levels checked. He has not had it checked since June.

## 2015-09-07 NOTE — Telephone Encounter (Signed)
Called and scheduled patient for his injection and his labs. He will come in tomorrow morning.

## 2015-09-08 ENCOUNTER — Ambulatory Visit (INDEPENDENT_AMBULATORY_CARE_PROVIDER_SITE_OTHER): Payer: Medicare Other | Admitting: *Deleted

## 2015-09-08 ENCOUNTER — Other Ambulatory Visit (INDEPENDENT_AMBULATORY_CARE_PROVIDER_SITE_OTHER): Payer: Medicare Other

## 2015-09-08 DIAGNOSIS — I10 Essential (primary) hypertension: Secondary | ICD-10-CM

## 2015-09-08 DIAGNOSIS — E785 Hyperlipidemia, unspecified: Secondary | ICD-10-CM | POA: Diagnosis not present

## 2015-09-08 DIAGNOSIS — R739 Hyperglycemia, unspecified: Secondary | ICD-10-CM | POA: Diagnosis not present

## 2015-09-08 DIAGNOSIS — E291 Testicular hypofunction: Secondary | ICD-10-CM | POA: Diagnosis not present

## 2015-09-08 DIAGNOSIS — E349 Endocrine disorder, unspecified: Secondary | ICD-10-CM

## 2015-09-08 LAB — COMPREHENSIVE METABOLIC PANEL
ALT: 17 U/L (ref 0–53)
AST: 26 U/L (ref 0–37)
Albumin: 4.3 g/dL (ref 3.5–5.2)
Alkaline Phosphatase: 70 U/L (ref 39–117)
BUN: 20 mg/dL (ref 6–23)
CO2: 30 mEq/L (ref 19–32)
Calcium: 9.2 mg/dL (ref 8.4–10.5)
Chloride: 101 mEq/L (ref 96–112)
Creatinine, Ser: 0.94 mg/dL (ref 0.40–1.50)
GFR: 83.84 mL/min (ref >60.00)
Glucose, Bld: 142 mg/dL — ABNORMAL HIGH (ref 70–99)
Potassium: 4.1 mEq/L (ref 3.5–5.1)
Sodium: 136 mEq/L (ref 135–145)
Total Bilirubin: 0.9 mg/dL (ref 0.2–1.2)
Total Protein: 6.7 g/dL (ref 6.0–8.3)

## 2015-09-08 LAB — CBC
HCT: 43.4 % (ref 39.0–52.0)
Hemoglobin: 15.2 g/dL (ref 13.0–17.0)
MCHC: 35 g/dL (ref 30.0–36.0)
MCV: 95.6 fl (ref 78.0–100.0)
Platelets: 358 10*3/uL (ref 150.0–400.0)
RBC: 4.54 Mil/uL (ref 4.22–5.81)
RDW: 13 % (ref 11.5–15.5)
WBC: 6.1 10*3/uL (ref 4.0–10.5)

## 2015-09-08 LAB — LIPID PANEL
Cholesterol: 160 mg/dL (ref 0–200)
HDL: 64.6 mg/dL (ref >39.00)
LDL Cholesterol: 67 mg/dL (ref 0–99)
NonHDL: 94.93
Total CHOL/HDL Ratio: 2
Triglycerides: 139 mg/dL (ref 0.0–149.0)
VLDL: 27.8 mg/dL (ref 0.0–40.0)

## 2015-09-08 LAB — TSH: TSH: 4.28 u[IU]/mL (ref 0.35–4.50)

## 2015-09-08 LAB — HEMOGLOBIN A1C: Hgb A1c MFr Bld: 6.4 % (ref 4.6–6.5)

## 2015-09-08 LAB — TESTOSTERONE: Testosterone: 62.29 ng/dL — ABNORMAL LOW (ref 300.00–890.00)

## 2015-09-08 MED ORDER — TESTOSTERONE CYPIONATE 200 MG/ML IM SOLN
100.0000 mg | Freq: Once | INTRAMUSCULAR | Status: AC
  Administered 2015-09-08: 100 mg via INTRAMUSCULAR

## 2015-09-08 NOTE — Progress Notes (Signed)
Pre visit review using our clinic review tool, if applicable. No additional management support is needed unless otherwise documented below in the visit note.  Pt tolerated injection well.   Falon Huesca J Ramatoulaye Pack, RN  

## 2015-10-18 ENCOUNTER — Other Ambulatory Visit: Payer: Self-pay | Admitting: Family Medicine

## 2015-10-20 ENCOUNTER — Other Ambulatory Visit: Payer: Self-pay | Admitting: Family Medicine

## 2015-11-09 ENCOUNTER — Other Ambulatory Visit: Payer: Self-pay | Admitting: Family Medicine

## 2015-11-09 ENCOUNTER — Telehealth: Payer: Self-pay | Admitting: Family Medicine

## 2015-11-09 MED ORDER — CELECOXIB 200 MG PO CAPS: ORAL_CAPSULE | ORAL | Status: DC

## 2015-11-09 NOTE — Telephone Encounter (Signed)
Printed prescription, patient will pickup at his convenience and take to [facility] his new pharmacy.

## 2015-11-09 NOTE — Telephone Encounter (Signed)
Pt called in requesting a prescription for Celecoxib 90 days supply. Pt says that he will come in to pick up.  CB: [phone]

## 2015-11-17 ENCOUNTER — Other Ambulatory Visit: Payer: Self-pay | Admitting: Family Medicine

## 2015-11-17 NOTE — Telephone Encounter (Signed)
Faxed hardcopy [facility] Valium to [facility] [address] [place] 

## 2015-11-19 ENCOUNTER — Other Ambulatory Visit: Payer: Self-pay | Admitting: Family Medicine

## 2015-11-25 ENCOUNTER — Other Ambulatory Visit: Payer: Self-pay | Admitting: Family Medicine

## 2015-11-25 NOTE — Telephone Encounter (Signed)
Rx sent to the pharmacy by e-script.  Pt needs office visit.//AB/CMA 

## 2015-12-03 ENCOUNTER — Other Ambulatory Visit: Payer: Self-pay | Admitting: Family Medicine

## 2015-12-08 DIAGNOSIS — L812 Freckles: Secondary | ICD-10-CM | POA: Diagnosis not present

## 2015-12-08 DIAGNOSIS — L570 Actinic keratosis: Secondary | ICD-10-CM | POA: Diagnosis not present

## 2015-12-08 DIAGNOSIS — D225 Melanocytic nevi of trunk: Secondary | ICD-10-CM | POA: Diagnosis not present

## 2015-12-08 DIAGNOSIS — L821 Other seborrheic keratosis: Secondary | ICD-10-CM | POA: Diagnosis not present

## 2015-12-15 DIAGNOSIS — H2513 Age-related nuclear cataract, bilateral: Secondary | ICD-10-CM | POA: Diagnosis not present

## 2015-12-15 DIAGNOSIS — H524 Presbyopia: Secondary | ICD-10-CM | POA: Diagnosis not present

## 2015-12-15 DIAGNOSIS — H25013 Cortical age-related cataract, bilateral: Secondary | ICD-10-CM | POA: Diagnosis not present

## 2015-12-28 DIAGNOSIS — M4716 Other spondylosis with myelopathy, lumbar region: Secondary | ICD-10-CM | POA: Diagnosis not present

## 2015-12-28 DIAGNOSIS — M4325 Fusion of spine, thoracolumbar region: Secondary | ICD-10-CM | POA: Diagnosis not present

## 2015-12-28 DIAGNOSIS — M25562 Pain in left knee: Secondary | ICD-10-CM | POA: Diagnosis not present

## 2015-12-28 DIAGNOSIS — M4005 Postural kyphosis, thoracolumbar region: Secondary | ICD-10-CM | POA: Diagnosis not present

## 2016-01-05 ENCOUNTER — Telehealth: Payer: Self-pay | Admitting: Family Medicine

## 2016-01-05 NOTE — Telephone Encounter (Signed)
°  Relationship to patient: Self Can be reached: [phone]   Pharmacy:  [place], [place] [phone] (Phone) [phone] (Fax)         Reason for call: Request a Rx for Sildenasil 20 mg tablets. 5 pack

## 2016-01-05 NOTE — Telephone Encounter (Signed)
These are sample packs at 20 mg (5 packs) to [facility] drug.

## 2016-01-06 ENCOUNTER — Other Ambulatory Visit: Payer: Self-pay

## 2016-01-06 ENCOUNTER — Other Ambulatory Visit: Payer: Self-pay | Admitting: Family Medicine

## 2016-01-06 DIAGNOSIS — N529 Male erectile dysfunction, unspecified: Secondary | ICD-10-CM

## 2016-01-06 NOTE — Telephone Encounter (Signed)
So is he asking for a prescription just for 5 tabs? If so that is fine. Sildenafil 20 mg tabs 1-5 tabs po once daily prn ED disp #5

## 2016-01-07 MED ORDER — SILDENAFIL CITRATE 20 MG PO TABS: 20.0000 mg | ORAL_TABLET | Freq: Three times a day (TID) | ORAL | Status: DC | PRN

## 2016-01-07 NOTE — Telephone Encounter (Signed)
Sent in as requested by pharmacy and ok'd by PCP.

## 2016-01-07 NOTE — Telephone Encounter (Signed)
Call from [place] at [facility]. Rx was written for #5 tablets, not 5 sample packs. They do not have sample packs. They would like permission to dispense #50 for cost savings for pt. Rx also sent w/ wrong sig, provided [place] Guiles w/ correct sig per Dr. Frederik Pear note below. Please advise if okay to fill #50.

## 2016-01-07 NOTE — Telephone Encounter (Signed)
OK to change the Sildenafil 20 mg tabs, same sig, disp #50 with 3 rf

## 2016-01-07 NOTE — Addendum Note (Signed)
Addended by: Sharon Seller B on: 01/07/2016 02:03 PM   Modules accepted: Orders

## 2016-01-07 NOTE — Telephone Encounter (Signed)
Refill done to [facility] drug as requested/instructed

## 2016-01-08 ENCOUNTER — Other Ambulatory Visit: Payer: Self-pay | Admitting: Family Medicine

## 2016-01-12 DIAGNOSIS — M25562 Pain in left knee: Secondary | ICD-10-CM | POA: Diagnosis not present

## 2016-01-13 ENCOUNTER — Other Ambulatory Visit: Payer: Self-pay | Admitting: Orthopaedic Surgery

## 2016-01-13 DIAGNOSIS — M25562 Pain in left knee: Secondary | ICD-10-CM

## 2016-01-18 ENCOUNTER — Other Ambulatory Visit: Payer: Self-pay | Admitting: Family Medicine

## 2016-01-18 NOTE — Telephone Encounter (Signed)
[facility] [address]

## 2016-01-20 ENCOUNTER — Ambulatory Visit
Admission: RE | Admit: 2016-01-20 | Discharge: 2016-01-20 | Disposition: A | Discharge status: Home / Self Care | Payer: Medicare Other | Source: Ambulatory Visit | Attending: Orthopaedic Surgery | Admitting: Orthopaedic Surgery

## 2016-01-20 DIAGNOSIS — S83232A Complex tear of medial meniscus, current injury, left knee, initial encounter: Secondary | ICD-10-CM | POA: Diagnosis not present

## 2016-01-20 DIAGNOSIS — M25562 Pain in left knee: Secondary | ICD-10-CM

## 2016-02-18 ENCOUNTER — Other Ambulatory Visit: Payer: Self-pay | Admitting: Family Medicine

## 2016-02-23 ENCOUNTER — Other Ambulatory Visit: Payer: Self-pay | Admitting: Family Medicine

## 2016-02-29 ENCOUNTER — Other Ambulatory Visit: Payer: Self-pay | Admitting: Family Medicine

## 2016-02-29 NOTE — Telephone Encounter (Signed)
Last refill 11/17/2015  #30 with 1 refill Next appt. Is tomorrow 03/01/2016

## 2016-02-29 NOTE — Telephone Encounter (Signed)
Faxed hardcopy for Valium to [facility] [address] [place]

## 2016-03-01 ENCOUNTER — Ambulatory Visit: Payer: Self-pay

## 2016-03-25 ENCOUNTER — Other Ambulatory Visit: Payer: Self-pay | Admitting: Family Medicine

## 2016-03-25 ENCOUNTER — Ambulatory Visit (INDEPENDENT_AMBULATORY_CARE_PROVIDER_SITE_OTHER): Payer: Medicare Other | Admitting: Family Medicine

## 2016-03-25 ENCOUNTER — Encounter: Payer: Self-pay | Admitting: Family Medicine

## 2016-03-25 VITALS — BP 112/72 | HR 65 | Temp 97.9°F | Ht 71.0 in | Wt 198.1 lb

## 2016-03-25 DIAGNOSIS — Z7289 Other problems related to lifestyle: Secondary | ICD-10-CM | POA: Diagnosis not present

## 2016-03-25 DIAGNOSIS — E785 Hyperlipidemia, unspecified: Secondary | ICD-10-CM | POA: Diagnosis not present

## 2016-03-25 DIAGNOSIS — R739 Hyperglycemia, unspecified: Secondary | ICD-10-CM | POA: Diagnosis not present

## 2016-03-25 DIAGNOSIS — N529 Male erectile dysfunction, unspecified: Secondary | ICD-10-CM

## 2016-03-25 DIAGNOSIS — Z0000 Encounter for general adult medical examination without abnormal findings: Secondary | ICD-10-CM

## 2016-03-25 DIAGNOSIS — Z23 Encounter for immunization: Secondary | ICD-10-CM | POA: Diagnosis not present

## 2016-03-25 DIAGNOSIS — E291 Testicular hypofunction: Secondary | ICD-10-CM

## 2016-03-25 DIAGNOSIS — I10 Essential (primary) hypertension: Secondary | ICD-10-CM | POA: Diagnosis not present

## 2016-03-25 DIAGNOSIS — K219 Gastro-esophageal reflux disease without esophagitis: Secondary | ICD-10-CM

## 2016-03-25 DIAGNOSIS — N400 Enlarged prostate without lower urinary tract symptoms: Secondary | ICD-10-CM

## 2016-03-25 DIAGNOSIS — E349 Endocrine disorder, unspecified: Secondary | ICD-10-CM

## 2016-03-25 DIAGNOSIS — E663 Overweight: Secondary | ICD-10-CM

## 2016-03-25 LAB — CBC
HCT: 43.8 % (ref 39.0–52.0)
Hemoglobin: 15.2 g/dL (ref 13.0–17.0)
MCHC: 34.8 g/dL (ref 30.0–36.0)
MCV: 98.8 fl (ref 78.0–100.0)
Platelets: 287 10*3/uL (ref 150.0–400.0)
RBC: 4.44 Mil/uL (ref 4.22–5.81)
RDW: 13.6 % (ref 11.5–15.5)
WBC: 6.4 10*3/uL (ref 4.0–10.5)

## 2016-03-25 LAB — TSH: TSH: 3.65 u[IU]/mL (ref 0.35–4.50)

## 2016-03-25 LAB — LIPID PANEL
Cholesterol: 149 mg/dL (ref 0–200)
HDL: 64.4 mg/dL (ref >39.00)
LDL Cholesterol: 69 mg/dL (ref 0–99)
NonHDL: 85.04
Total CHOL/HDL Ratio: 2
Triglycerides: 80 mg/dL (ref 0.0–149.0)
VLDL: 16 mg/dL (ref 0.0–40.0)

## 2016-03-25 LAB — COMPREHENSIVE METABOLIC PANEL
ALT: 12 U/L (ref 0–53)
AST: 23 U/L (ref 0–37)
Albumin: 4.3 g/dL (ref 3.5–5.2)
Alkaline Phosphatase: 57 U/L (ref 39–117)
BUN: 20 mg/dL (ref 6–23)
CO2: 30 mEq/L (ref 19–32)
Calcium: 8.7 mg/dL (ref 8.4–10.5)
Chloride: 102 mEq/L (ref 96–112)
Creatinine, Ser: 0.93 mg/dL (ref 0.40–1.50)
GFR: 84.75 mL/min (ref >60.00)
Glucose, Bld: 127 mg/dL — ABNORMAL HIGH (ref 70–99)
Potassium: 4.3 mEq/L (ref 3.5–5.1)
Sodium: 137 mEq/L (ref 135–145)
Total Bilirubin: 0.7 mg/dL (ref 0.2–1.2)
Total Protein: 6.5 g/dL (ref 6.0–8.3)

## 2016-03-25 LAB — TESTOSTERONE: Testosterone: 189.75 ng/dL — ABNORMAL LOW (ref 300.00–890.00)

## 2016-03-25 LAB — HEMOGLOBIN A1C: Hgb A1c MFr Bld: 6.2 % (ref 4.6–6.5)

## 2016-03-25 LAB — PSA: PSA: 0.27 ng/mL (ref 0.10–4.00)

## 2016-03-25 LAB — HEPATITIS C ANTIBODY: HCV Ab: NEGATIVE

## 2016-03-25 MED ORDER — TAMSULOSIN HCL 0.4 MG PO CAPS: 0.8000 mg | ORAL_CAPSULE | Freq: Every day | ORAL | 1 refills | Status: DC

## 2016-03-25 MED ORDER — ZOSTER VACCINE LIVE 19400 UNT/0.65ML ~~LOC~~ SUSR: 0.6500 mL | Freq: Once | SUBCUTANEOUS | 0 refills | Status: AC

## 2016-03-25 MED ORDER — RANITIDINE HCL 300 MG PO TABS: 300.0000 mg | ORAL_TABLET | Freq: Every day | ORAL | 3 refills | Status: DC

## 2016-03-25 MED ORDER — VENLAFAXINE HCL ER 75 MG PO CP24: 75.0000 mg | ORAL_CAPSULE | Freq: Every day | ORAL | 3 refills | Status: DC

## 2016-03-25 MED ORDER — TESTOSTERONE CYPIONATE 200 MG/ML IM SOLN: INTRAMUSCULAR | 1 refills | Status: DC

## 2016-03-25 NOTE — Patient Instructions (Signed)

## 2016-03-25 NOTE — Assessment & Plan Note (Signed)
>>  ASSESSMENT AND PLAN FOR TESTOSTERONE DEFICIENCY WRITTEN ON 03/25/2016  8:34 AM BY BLYTH, STACEY A, MD  Last shot a week ago. Given refill and recheck level today

## 2016-03-25 NOTE — Assessment & Plan Note (Signed)
Well controlled, no changes to meds. Encouraged heart healthy diet such as the DASH diet and exercise as tolerated.  °

## 2016-03-25 NOTE — Assessment & Plan Note (Signed)
Tolerating statin, encouraged heart healthy diet, avoid trans fats, minimize simple carbs and saturated fats. Increase exercise as tolerated 

## 2016-03-25 NOTE — Assessment & Plan Note (Signed)
Last shot a week ago. Given refill and recheck level today

## 2016-03-25 NOTE — Assessment & Plan Note (Signed)
Sildenafil working well when needed

## 2016-03-25 NOTE — Assessment & Plan Note (Signed)
Avoid offending foods, start probiotics. Do not eat large meals in late evening and consider raising head of bed.  

## 2016-03-25 NOTE — Assessment & Plan Note (Signed)
Check psa today, doing well on Tamulosin

## 2016-03-25 NOTE — Progress Notes (Signed)
[name], male   DOB: 1945-08-28, 70 y.o.   MRN: [id number]   Subjective:    [name], male    DOB: 12-15-1945, 70 y.o.   MRN: [id number]  Chief Complaint  Patient presents with  . Follow-up    HPI Patient is in today for follow up. He feels well today. No recent illness or acute concerns. Does note some fatigue. Denies CP/palp/SOB/HA/congestion/fevers/GI or GU c/o. Taking meds as prescribed  Past Medical History:  Diagnosis Date  . Acute pharyngitis 10/21/2013  . Allergy    grass and pollen  . Anxiety and depression 10/25/2011  . Asthma    exercise induced  . BPH (benign prostatic hyperplasia) 04/23/2012  . Chicken pox as a child  . DDD (degenerative disc disease)    cervical responds to steroid injections and low back required surgery  . DDD (degenerative disc disease), lumbosacral   . Diabetes mellitus    pre  . ED (erectile dysfunction) 04/23/2012  . Elevated BP   . Esophageal reflux 02/10/2015  . Fatigue   . HTN (hypertension)   . Hyperglycemia    preDM   . Hyperlipidemia   . Insomnia   . Measles as a child  . Medicare annual wellness visit, subsequent 02/10/2015  . Overweight(278.02)   . Personal history of colonic polyps 10/27/2012   Follows with [facility]  . Preventative health care   . 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    Past Surgical History:  Procedure Laterality Date  . BACK SURGERY  2012 and 1994   Dr Margret Chance, screws and cage in low back  . TONSILLECTOMY    . torn rotator cuff  2010   right    Family History  Problem Relation Age of Onset  . Hypertension Mother   . Diabetes Mother     type 2  . Cancer Mother 53    breast in remission  . Emphysema Father     smoker  . COPD Father     smoker  . Stroke Father 9    mini  . Heart disease Father   . Hypertension Sister   . Hyperlipidemia Sister   . Scoliosis Sister   . Osteoporosis Sister   . Hypertension Maternal Grandmother   . Scoliosis  Maternal Grandmother   . Heart disease Maternal Grandfather   . Heart disease Paternal Grandfather     smoker  . Heart disease Daughter     Social History   Social History  . Marital status: Married    Spouse name: N/A  . Number of children: N/A  . Years of education: N/A   Occupational History  . Not on file.   Social History Main Topics  . Smoking status: Former Smoker    Packs/day: 1.00    Years: 20.00    Types: Cigarettes    Quit date: 07/18/1989  . Smokeless tobacco: Never Used     Comment: off and on smoking for the 20 yrs  . Alcohol use Yes     Comment: drink or two a day- mixed drink  . Drug use: No  . Sexual activity: Yes     Comment: lives with wife, still working, no dietary restrictions, continues to exercise intermittently   Other Topics Concern  . Not on file   Social History Narrative  . No narrative on file    Outpatient Medications Prior to Visit  Medication Sig Dispense Refill  . acyclovir (ZOVIRAX) 200 MG capsule TAKE  ONE CAPSULE BY MOUTH 5 TIMES A DAY 25 capsule 1  . aspirin 81 MG tablet Take 81 mg by mouth daily.    Marland Kitchen atorvastatin (LIPITOR) 20 MG tablet TAKE 1/2 TABLET BY MOUTH EVERY DAY AT 6PM 45 tablet 0  . b complex vitamins tablet Take 1 tablet by mouth daily.    . celecoxib (CELEBREX) 200 MG capsule TAKE 1 CAPSULE (200 MG TOTAL) BY MOUTH DAILY. 90 capsule 1  . diazepam (VALIUM) 10 MG tablet TAKE 1 TABLET BY MOUTH AT BEDTIME AS NEEDED 30 tablet 1  . ECHINACEA PLUS CAPS Take 1 capsule by mouth as needed. 750 mg    . glucose blood (ONE TOUCH ULTRA TEST) test strip Check daily 32 each 1  . lisinopril (PRINIVIL,ZESTRIL) 5 MG tablet TAKE 1 TABLET BY MOUTH EVERY DAY 90 tablet 1  . metFORMIN (GLUCOPHAGE) 500 MG tablet TAKE 1 TABLET BY MOUTH EVERY DAY 90 tablet 2  . Multiple Vitamin (MULTIVITAMIN) tablet Take 1 tablet by mouth daily.    . sildenafil (REVATIO) 20 MG tablet Take 1 tablet (20 mg total) by mouth 3 (three) times daily as needed. 50 tablet  3  . traMADol (ULTRAM) 50 MG tablet TAKE 1/2 TO 1 TABLET BY MOUTH 4 TIMES A DAY AS NEEDED FOR PAIN 120 tablet 0  . atorvastatin (LIPITOR) 20 MG tablet TAKE 1/2 A TABLET BY MOUTH EVERY DAY AT 6PM 45 tablet 2  . atorvastatin (LIPITOR) 20 MG tablet TAKE 1/2 A TABLET BY MOUTH EVERY DAY AT 6PM 45 tablet 0  . atorvastatin (LIPITOR) 20 MG tablet TAKE 1/2 TABLET BY MOUTH EVERY DAY AT 6PM 45 tablet 0  . lisinopril (PRINIVIL,ZESTRIL) 5 MG tablet TAKE 1 TABLET BY MOUTH EVERY DAY 90 tablet 1  . ranitidine (ZANTAC) 300 MG tablet TAKE 1 TABLET (300 MG TOTAL) BY MOUTH AT BEDTIME. 90 tablet 0  . sildenafil (VIAGRA) 100 MG tablet Take 1/2 tab to 1 tab po daily prn ED 10 tablet 2  . tamsulosin (FLOMAX) 0.4 MG CAPS capsule TAKE 2 CAPSULES EVERY DAY 180 capsule 2  . tamsulosin (FLOMAX) 0.4 MG CAPS capsule TAKE 2 CAPSULES BY MOUTH EVERY DAY 180 capsule 0  . testosterone cypionate (DEPOTESTOSTERONE CYPIONATE) 200 MG/ML injection INJECT .5 MLS INTO THE MUSCLE EVERY 14 DAYS 10 mL 0  . venlafaxine (EFFEXOR) 75 MG tablet TAKE 1 TABLET BY MOUTH EVERY DAY AT 6AM 90 tablet 1  . cetirizine (ZYRTEC) 10 MG tablet Take 1 tablet (10 mg total) by mouth daily as needed for allergies or rhinitis. 30 tablet 11  . fluticasone (FLONASE) 50 MCG/ACT nasal spray Place 2 sprays into the nose daily. 48 g 1  . testosterone cypionate (DEPOTESTOSTERONE CYPIONATE) injection 200 mg      No facility-administered medications prior to visit.     No Known Allergies  Review of Systems  Constitutional: Negative for fever and malaise/fatigue.  HENT: Negative for congestion.   Eyes: Negative for blurred vision.  Respiratory: Negative for shortness of breath.   Cardiovascular: Negative for chest pain, palpitations and leg swelling.  Gastrointestinal: Negative for abdominal pain, blood in stool and nausea.  Genitourinary: Negative for dysuria and frequency.  Musculoskeletal: Negative for falls.  Skin: Negative for rash.  Neurological: Negative  for dizziness, loss of consciousness and headaches.  Endo/Heme/Allergies: Negative for environmental allergies.  Psychiatric/Behavioral: Negative for depression. The patient is not nervous/anxious.        Objective:    Physical Exam  Constitutional: He is oriented to  person, place, and time. He appears well-developed and well-nourished. No distress.  HENT:  Head: Normocephalic and atraumatic.  Nose: Nose normal.  Eyes: Right eye exhibits no discharge. Left eye exhibits no discharge.  Neck: Normal range of motion. Neck supple.  Cardiovascular: Normal rate and regular rhythm.   No murmur heard. Pulmonary/Chest: Effort normal and breath sounds normal.  Abdominal: Soft. Bowel sounds are normal. There is no tenderness.  Musculoskeletal: He exhibits no edema.  Neurological: He is alert and oriented to person, place, and time.  Skin: Skin is warm and dry.  Psychiatric: He has a normal mood and affect.  Nursing note and vitals reviewed.   BP 112/72 (BP Location: Left Arm, Patient Position: Sitting, Cuff Size: Normal)   Pulse 65   Temp 97.9 F (36.6 C) (Oral)   Ht 5\' 11"  (1.803 m)   Wt 198 lb 2 oz (89.9 kg)   SpO2 97%   BMI 27.63 kg/m  Wt Readings from Last 3 Encounters:  03/25/16 198 lb 2 oz (89.9 kg)  02/10/15 207 lb 6 oz (94.1 kg)  10/20/14 200 lb (90.7 kg)     Lab Results  Component Value Date   WBC 6.1 09/08/2015   HGB 15.2 09/08/2015   HCT 43.4 09/08/2015   PLT 358.0 09/08/2015   GLUCOSE 142 (H) 09/08/2015   CHOL 160 09/08/2015   TRIG 139.0 09/08/2015   HDL 64.60 09/08/2015   LDLCALC 67 09/08/2015   ALT 17 09/08/2015   AST 26 09/08/2015   NA 136 09/08/2015   K 4.1 09/08/2015   CL 101 09/08/2015   CREATININE 0.94 09/08/2015   BUN 20 09/08/2015   CO2 30 09/08/2015   TSH 4.28 09/08/2015   PSA 0.24 02/10/2015   INR 0.91 04/08/2011   HGBA1C 6.4 09/08/2015   MICROALBUR 0.61 04/23/2012    Lab Results  Component Value Date   TSH 4.28 09/08/2015   Lab  Results  Component Value Date   WBC 6.1 09/08/2015   HGB 15.2 09/08/2015   HCT 43.4 09/08/2015   MCV 95.6 09/08/2015   PLT 358.0 09/08/2015   Lab Results  Component Value Date   NA 136 09/08/2015   K 4.1 09/08/2015   CO2 30 09/08/2015   GLUCOSE 142 (H) 09/08/2015   BUN 20 09/08/2015   CREATININE 0.94 09/08/2015   BILITOT 0.9 09/08/2015   ALKPHOS 70 09/08/2015   AST 26 09/08/2015   ALT 17 09/08/2015   PROT 6.7 09/08/2015   ALBUMIN 4.3 09/08/2015   CALCIUM 9.2 09/08/2015   GFR 83.84 09/08/2015   Lab Results  Component Value Date   CHOL 160 09/08/2015   Lab Results  Component Value Date   HDL 64.60 09/08/2015   Lab Results  Component Value Date   LDLCALC 67 09/08/2015   Lab Results  Component Value Date   TRIG 139.0 09/08/2015   Lab Results  Component Value Date   CHOLHDL 2 09/08/2015   Lab Results  Component Value Date   HGBA1C 6.4 09/08/2015       Assessment & Plan:   Problem List Items Addressed This Visit    HTN (hypertension)    Well controlled, no changes to meds. Encouraged heart healthy diet such as the DASH diet and exercise as tolerated.       Relevant Orders   TSH   CBC   Comprehensive metabolic panel   Hyperglycemia    hgba1c acceptable, minimize simple carbs. Increase exercise as tolerated.  Relevant Orders   Hemoglobin A1c   Hyperlipidemia - Primary    Tolerating statin, encouraged heart healthy diet, avoid trans fats, minimize simple carbs and saturated fats. Increase exercise as tolerated      Relevant Orders   Lipid panel   Overweight   Preventative health care   Relevant Orders   Hepatitis C Antibody   BPH (benign prostatic hyperplasia)    Check psa today, doing well on Tamulosin      Relevant Medications   tamsulosin (FLOMAX) 0.4 MG CAPS capsule   Other Relevant Orders   Testosterone   PSA   ED (erectile dysfunction)    Sildenafil working well when needed      Testosterone deficiency    Last shot a week  ago. Given refill and recheck level today      Relevant Orders   Testosterone   PSA   Esophageal reflux    Avoid offending foods, start probiotics. Do not eat large meals in late evening and consider raising head of bed.       Relevant Medications   ranitidine (ZANTAC) 300 MG tablet    Other Visit Diagnoses    Encounter for immunization       Relevant Medications   ranitidine (ZANTAC) 300 MG tablet   testosterone cypionate (DEPOTESTOSTERONE CYPIONATE) 200 MG/ML injection   Other Relevant Orders   Flu vaccine HIGH DOSE PF (Completed)   TSH   CBC   Comprehensive metabolic panel   Lipid panel   Hemoglobin A1c   Testosterone      I have discontinued Mr. Chaddock's tamsulosin and venlafaxine. I have also changed his ranitidine and tamsulosin. Additionally, I am having him start on venlafaxine XR and Zoster Vaccine Live (PF). Lastly, I am having him maintain his aspirin, ECHINACEA PLUS, b complex vitamins, multivitamin, cetirizine, fluticasone, glucose blood, traMADol, atorvastatin, celecoxib, metFORMIN, lisinopril, sildenafil, acyclovir, diazepam, and testosterone cypionate. We will stop administering testosterone cypionate.  Meds ordered this encounter  Medications  . ranitidine (ZANTAC) 300 MG tablet    Sig: Take 1 tablet (300 mg total) by mouth daily.    Dispense:  90 tablet    Refill:  3  . testosterone cypionate (DEPOTESTOSTERONE CYPIONATE) 200 MG/ML injection    Sig: INJECT .5 MLS INTO THE MUSCLE EVERY 14 DAYS    Dispense:  10 mL    Refill:  1    This request is for a new prescription for a controlled substance as required by Federal/State law.  . tamsulosin (FLOMAX) 0.4 MG CAPS capsule    Sig: Take 2 capsules (0.8 mg total) by mouth daily.    Dispense:  180 capsule    Refill:  1    PATIENT NEEDS APPOINTMENT FOR FURTHER REFILLS.  Marland Kitchen venlafaxine XR (EFFEXOR XR) 75 MG 24 hr capsule    Sig: Take 1 capsule (75 mg total) by mouth daily with breakfast.    Dispense:  30  capsule    Refill:  3  . Zoster Vaccine Live, PF, (ZOSTAVAX) 09811 UNT/0.65ML injection    Sig: Inject 19,400 Units into the skin once.    Dispense:  1 each    Refill:  0     Penni Homans, MD

## 2016-03-25 NOTE — Assessment & Plan Note (Signed)
hgba1c acceptable, minimize simple carbs. Increase exercise as tolerated.  

## 2016-03-25 NOTE — Progress Notes (Signed)
Pre visit review using our clinic review tool, if applicable. No additional management support is needed unless otherwise documented below in the visit note. 

## 2016-03-29 ENCOUNTER — Telehealth: Payer: Self-pay

## 2016-03-29 DIAGNOSIS — H18413 Arcus senilis, bilateral: Secondary | ICD-10-CM | POA: Diagnosis not present

## 2016-03-29 DIAGNOSIS — H02839 Dermatochalasis of unspecified eye, unspecified eyelid: Secondary | ICD-10-CM | POA: Diagnosis not present

## 2016-03-29 DIAGNOSIS — H2512 Age-related nuclear cataract, left eye: Secondary | ICD-10-CM | POA: Diagnosis not present

## 2016-03-29 DIAGNOSIS — H2513 Age-related nuclear cataract, bilateral: Secondary | ICD-10-CM | POA: Diagnosis not present

## 2016-03-29 NOTE — Telephone Encounter (Signed)
Patient called to ask if he could get an appointment for nurse to give him his testosterone shot. He has his own medication. Please advise [phone]

## 2016-03-29 NOTE — Telephone Encounter (Signed)
Ok with me for him to come in for testosterone injection 1 ml q o week if we have a protocol to allow it.

## 2016-03-30 NOTE — Telephone Encounter (Signed)
We can do testosterone injection at nurse visit. Typically his daughter-in-law, who is a [employer] scientist, does the injections for him at home. Please schedule pt at his convenience, 2 weeks after his most recent injection.

## 2016-03-31 ENCOUNTER — Ambulatory Visit (INDEPENDENT_AMBULATORY_CARE_PROVIDER_SITE_OTHER): Payer: Medicare Other | Admitting: *Deleted

## 2016-03-31 DIAGNOSIS — E291 Testicular hypofunction: Secondary | ICD-10-CM | POA: Diagnosis not present

## 2016-03-31 DIAGNOSIS — R7989 Other specified abnormal findings of blood chemistry: Secondary | ICD-10-CM

## 2016-03-31 MED ORDER — TESTOSTERONE CYPIONATE 200 MG/ML IM SOLN
200.0000 mg | Freq: Once | INTRAMUSCULAR | Status: AC
  Administered 2016-03-31: 200 mg via INTRAMUSCULAR

## 2016-03-31 NOTE — Progress Notes (Signed)
Pre visit review using our clinic review tool, if applicable. No additional management support is needed unless otherwise documented below in the visit note.  Pt here for testosterone injection per Dr. [name]. Last injection 2 weeks ago today per pt. Patient tolerated injection well.  Dorrene German, RN

## 2016-04-30 ENCOUNTER — Other Ambulatory Visit: Payer: Self-pay | Admitting: Family Medicine

## 2016-05-02 ENCOUNTER — Telehealth: Payer: Self-pay | Admitting: Family Medicine

## 2016-05-02 NOTE — Telephone Encounter (Signed)
Called cell/home left message to call back.

## 2016-05-02 NOTE — Telephone Encounter (Signed)
Last injection was on 03/31/2016 Ok to scheduled let me know

## 2016-05-02 NOTE — Telephone Encounter (Signed)
Relation to pt: self Call back number: [phone]   Reason for call:  Patient requesting testosterone injection, requesting orders.patient would like to scheduled nurse visit for 05/03/16.

## 2016-05-02 NOTE — Telephone Encounter (Signed)
Patient scheduled for 05/03/16 for 2:30pm, patient stated he brings he's on medication.

## 2016-05-02 NOTE — Telephone Encounter (Signed)
Ok to schedule thank you

## 2016-05-03 ENCOUNTER — Ambulatory Visit (INDEPENDENT_AMBULATORY_CARE_PROVIDER_SITE_OTHER): Payer: Medicare Other | Admitting: *Deleted

## 2016-05-03 DIAGNOSIS — E349 Endocrine disorder, unspecified: Secondary | ICD-10-CM

## 2016-05-03 DIAGNOSIS — R7989 Other specified abnormal findings of blood chemistry: Secondary | ICD-10-CM

## 2016-05-03 MED ORDER — TESTOSTERONE CYPIONATE 200 MG/ML IM SOLN
200.0000 mg | Freq: Once | INTRAMUSCULAR | Status: AC
  Administered 2016-05-03: 200 mg via INTRAMUSCULAR

## 2016-05-03 NOTE — Progress Notes (Signed)
Pre visit review using our clinic review tool, if applicable. No additional management support is needed unless otherwise documented below in the visit note.  Pt here for testosterone injection. Patient tolerated injection well.  Dorrene German, RN

## 2016-05-09 DIAGNOSIS — H2512 Age-related nuclear cataract, left eye: Secondary | ICD-10-CM | POA: Diagnosis not present

## 2016-05-09 DIAGNOSIS — H25012 Cortical age-related cataract, left eye: Secondary | ICD-10-CM | POA: Diagnosis not present

## 2016-05-10 DIAGNOSIS — H2511 Age-related nuclear cataract, right eye: Secondary | ICD-10-CM | POA: Diagnosis not present

## 2016-05-14 ENCOUNTER — Other Ambulatory Visit: Payer: Self-pay | Admitting: Family Medicine

## 2016-05-17 ENCOUNTER — Telehealth: Payer: Self-pay | Admitting: Family Medicine

## 2016-05-17 NOTE — Telephone Encounter (Signed)
Relation to [id number] Call back number:[phone]   Reason for call:   FYI-  Patient scheduled testosterone shot with the nurse 05/18/16 states he brings he's on medication.

## 2016-05-18 ENCOUNTER — Ambulatory Visit (INDEPENDENT_AMBULATORY_CARE_PROVIDER_SITE_OTHER): Payer: Medicare Other | Admitting: *Deleted

## 2016-05-18 DIAGNOSIS — E349 Endocrine disorder, unspecified: Secondary | ICD-10-CM

## 2016-05-18 DIAGNOSIS — R7989 Other specified abnormal findings of blood chemistry: Secondary | ICD-10-CM

## 2016-05-18 MED ORDER — TESTOSTERONE CYPIONATE 200 MG/ML IM SOLN
200.0000 mg | Freq: Once | INTRAMUSCULAR | Status: AC
  Administered 2016-05-18: 200 mg via INTRAMUSCULAR

## 2016-05-18 NOTE — Progress Notes (Signed)
Pt here for testosterone injection per 03/25/16 result notes. Patient tolerated injection well.  Next appointment: 06/01/16  Dorrene German, RN

## 2016-05-18 NOTE — Progress Notes (Signed)
Pre visit review using our clinic review tool, if applicable. No additional management support is needed unless otherwise documented below in the visit note. 

## 2016-05-21 ENCOUNTER — Other Ambulatory Visit (INDEPENDENT_AMBULATORY_CARE_PROVIDER_SITE_OTHER): Payer: Self-pay | Admitting: Specialist

## 2016-05-23 NOTE — Telephone Encounter (Signed)
Ok to refill 

## 2016-05-23 NOTE — Telephone Encounter (Signed)
Called patient and advised rx ready to be picked up Thanks.

## 2016-05-30 DIAGNOSIS — H2511 Age-related nuclear cataract, right eye: Secondary | ICD-10-CM | POA: Diagnosis not present

## 2016-05-31 ENCOUNTER — Other Ambulatory Visit: Payer: Self-pay | Admitting: Family Medicine

## 2016-05-31 NOTE — Telephone Encounter (Signed)
Fax did not go through. Called the pharmacy as [facility] have not been receiving electronic[facility]faxes since yesterday. [facility] [address]. Valium 10 mg  #30 with 1 refill.

## 2016-05-31 NOTE — Telephone Encounter (Signed)
Faxed hardcopy for [facility] to [facility] on [address]

## 2016-05-31 NOTE — Telephone Encounter (Signed)
Requesting:  Valium Contract   10/20/2014 UDS  None Last OV   03/25/2016 Last Refill    #30 with 1 refill on 02/29/2016  Please Advise

## 2016-06-01 ENCOUNTER — Ambulatory Visit (INDEPENDENT_AMBULATORY_CARE_PROVIDER_SITE_OTHER): Payer: Medicare Other

## 2016-06-01 DIAGNOSIS — E291 Testicular hypofunction: Secondary | ICD-10-CM

## 2016-06-01 MED ORDER — TESTOSTERONE CYPIONATE 100 MG/ML IM SOLN
200.0000 mg | Freq: Once | INTRAMUSCULAR | Status: AC
  Administered 2016-06-01: 200 mg via INTRAMUSCULAR

## 2016-06-01 NOTE — Progress Notes (Signed)
Pre visit review using our clinic tool,if applicable. No additional management support is needed unless otherwise documented below in the visit note.   Patient in for Testosterone injection per order from Dr. Humphrey Rolls. 200 mg/ml given IM Right hip. Patient tolerated well.

## 2016-06-15 ENCOUNTER — Ambulatory Visit (INDEPENDENT_AMBULATORY_CARE_PROVIDER_SITE_OTHER): Payer: Medicare Other | Admitting: Behavioral Health

## 2016-06-15 DIAGNOSIS — E349 Endocrine disorder, unspecified: Secondary | ICD-10-CM | POA: Diagnosis not present

## 2016-06-15 MED ORDER — TESTOSTERONE CYPIONATE 200 MG/ML IM SOLN
200.0000 mg | Freq: Once | INTRAMUSCULAR | Status: AC
  Administered 2016-06-15: 200 mg via INTRAMUSCULAR

## 2016-06-15 NOTE — Progress Notes (Signed)
Pre visit review using our clinic review tool, if applicable. No additional management support is needed unless otherwise documented below in the visit note.  Pt appeared to have tolerated injection well. Next appointment made for 06/29/2016 at 2pm.

## 2016-06-29 ENCOUNTER — Ambulatory Visit (INDEPENDENT_AMBULATORY_CARE_PROVIDER_SITE_OTHER): Payer: Medicare Other

## 2016-06-29 DIAGNOSIS — E349 Endocrine disorder, unspecified: Secondary | ICD-10-CM

## 2016-06-29 MED ORDER — TESTOSTERONE CYPIONATE 100 MG/ML IM SOLN
200.0000 mg | Freq: Once | INTRAMUSCULAR | Status: AC
  Administered 2016-06-29: 200 mg via INTRAMUSCULAR

## 2016-06-29 NOTE — Progress Notes (Signed)
Pre visit review using our clinic tool,if applicable. No additional management support is needed unless otherwise documented below in the visit note.   Patient in for Testosterone injection per order from Dr. Frederik Pear.

## 2016-07-06 ENCOUNTER — Telehealth: Payer: Self-pay | Admitting: Family Medicine

## 2016-07-06 NOTE — Telephone Encounter (Signed)
Pt called stating he needed Dr. [name] to call and approve refill on Flomax. Advised pt RX was sent 03/25/16 for 3 month supply with 1 refill. Pt said he never had it filled. [facility] [address] and they received the RX and dispensed a 90 day supply on 05/21/16. I have notified pt and he doesn't remember getting it. He will double check at home and call us back if he cannot find it.

## 2016-07-09 DIAGNOSIS — J180 Bronchopneumonia, unspecified organism: Secondary | ICD-10-CM | POA: Diagnosis not present

## 2016-07-13 ENCOUNTER — Ambulatory Visit: Payer: Medicare Other

## 2016-07-22 ENCOUNTER — Other Ambulatory Visit: Payer: Self-pay | Admitting: Family Medicine

## 2016-07-27 ENCOUNTER — Other Ambulatory Visit: Payer: Self-pay | Admitting: Family Medicine

## 2016-08-04 ENCOUNTER — Other Ambulatory Visit: Payer: Self-pay | Admitting: Family Medicine

## 2016-08-10 ENCOUNTER — Other Ambulatory Visit: Payer: Self-pay | Admitting: Family Medicine

## 2016-08-10 NOTE — Telephone Encounter (Signed)
Medication filled to pharmacy as requested.   

## 2016-08-11 ENCOUNTER — Telehealth: Payer: Self-pay | Admitting: Family Medicine

## 2016-08-11 NOTE — Telephone Encounter (Signed)
02/10/15 [place] PPPS, SUBSEQ VISIT [id number] lvm advising patient to schedule medicare wellness with health coach and follow up with PCP

## 2016-08-22 ENCOUNTER — Other Ambulatory Visit: Payer: Self-pay | Admitting: Family Medicine

## 2016-08-22 NOTE — Progress Notes (Addendum)
Subjective:   Luis Brown is a 71 y.o. male who presents for Medicare Annual/Subsequent preventive examination.  Review of Systems:  No ROS.  Medicare Wellness Visit.  Cardiac Risk Factors include: advanced age (>88men, >26 women);diabetes mellitus;dyslipidemia;male gender;sedentary lifestyle;hypertension Sleep patterns: Sleeps 5-6 hrs per night. Feels rested. Home Safety/Smoke Alarms: Feels safe in home. Smoke alarms and carbon monoxide detectors in place.   Living environment; residence and Firearm Safety: Lives at home with wife and dogs. Guns safely stored.  Seat Belt Safety/Bike Helmet: Wears seat belt.   Counseling:   Eye Exam- Wears reading glasses. Dr.Woods annually and as needed.  Dental- Dental Works every 6 months.  Male:   CCS- Last 09/05/12: Normal   PSA-  Lab Results  Component Value Date   PSA 0.27 03/25/2016   PSA 0.24 02/10/2015   PSA 0.31 05/06/2013        Objective:    Vitals: BP 130/76 (BP Location: Right Arm, Patient Position: Sitting, Cuff Size: Large)   Pulse 78   Ht 5\' 11"  (1.803 m)   Wt 203 lb 3.2 oz (92.2 kg)   SpO2 98%   BMI 28.34 kg/m   Body mass index is 28.34 kg/m.  Tobacco History  Smoking Status  . Former Smoker  . Packs/day: 1.00  . Years: 20.00  . Types: Cigarettes  . Quit date: 07/18/1989  Smokeless Tobacco  . Never Used    Comment: off and on smoking for the 20 yrs     Counseling given: No   Past Medical History:  Diagnosis Date  . Acute pharyngitis 10/21/2013  . Allergy    grass and pollen  . Anxiety and depression 10/25/2011  . Asthma    exercise induced  . BPH (benign prostatic hyperplasia) 04/23/2012  . Chicken pox as a child  . DDD (degenerative disc disease)    cervical responds to steroid injections and low back required surgery  . DDD (degenerative disc disease), lumbosacral   . Diabetes mellitus    pre  . ED (erectile dysfunction) 04/23/2012  . Elevated BP   . Esophageal reflux 02/10/2015  . Fatigue     . HTN (hypertension)   . Hyperglycemia    preDM   . Hyperlipidemia   . Insomnia   . Measles as a child  . Medicare annual wellness visit, subsequent 02/10/2015  . Overweight(278.02)   . Personal history of colonic polyps 10/27/2012   Follows with [facility]  . Preventative health care   . 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   Past Surgical History:  Procedure Laterality Date  . BACK SURGERY  2012 and 1994   Dr Margret Chance, screws and cage in low back  . TONSILLECTOMY    . torn rotator cuff  2010   right   Family History  Problem Relation Age of Onset  . Hypertension Mother   . Diabetes Mother     type 2  . Cancer Mother 69    breast in remission  . Emphysema Father     smoker  . COPD Father     smoker  . Stroke Father 61    mini  . Heart disease Father   . Hypertension Sister   . Hyperlipidemia Sister   . Scoliosis Sister   . Osteoporosis Sister   . Hypertension Maternal Grandmother   . Scoliosis Maternal Grandmother   . Heart disease Maternal Grandfather   . Heart disease Paternal Grandfather     smoker  . Heart disease Daughter  History  Sexual Activity  . Sexual activity: Yes    Comment: lives with wife, still working, no dietary restrictions, continues to exercise intermittently    Outpatient Encounter Prescriptions as of 08/23/2016  Medication Sig  . aspirin 81 MG tablet Take 81 mg by mouth daily.  Marland Kitchen atorvastatin (LIPITOR) 20 MG tablet TAKE 1/2 TABLET BY MOUTH DAILY AT 6 PM  . b complex vitamins tablet Take 1 tablet by mouth daily.  . celecoxib (CELEBREX) 200 MG capsule TAKE 1 CAPSULE BY MOUTH DAILY  . diazepam (VALIUM) 10 MG tablet TAKE 1 TABLET BY MOUTH EVERY DAY AT BEDTIME AS NEEDED  . ECHINACEA PLUS CAPS Take 1 capsule by mouth as needed. 750 mg  . glucose blood (ONE TOUCH ULTRA TEST) test strip Check daily  . lisinopril (PRINIVIL,ZESTRIL) 5 MG tablet TAKE 1 TABLET BY MOUTH EVERY DAY  . metFORMIN (GLUCOPHAGE) 500 MG tablet TAKE 1 TABLET BY  MOUTH EVERY DAY  . Multiple Vitamin (MULTIVITAMIN) tablet Take 1 tablet by mouth daily.  . ranitidine (ZANTAC) 300 MG tablet Take 1 tablet (300 mg total) by mouth daily.  . sildenafil (REVATIO) 20 MG tablet Take 1 tablet (20 mg total) by mouth 3 (three) times daily as needed.  . testosterone cypionate (DEPOTESTOSTERONE CYPIONATE) 200 MG/ML injection INJECT .5 MLS INTO THE MUSCLE EVERY 14 DAYS (Patient taking differently: INJECT 1 MLS INTO THE MUSCLE EVERY 14 DAYS)  . traMADol (ULTRAM) 50 MG tablet TAKE 1/2 TO 1 TABLET BY MOUTH 4 TIMES DAILY AS NEEDED FOR PAIN  . venlafaxine XR (EFFEXOR-XR) 75 MG 24 hr capsule TAKE 1 CAPSULE (75 MG TOTAL) BY MOUTH DAILY WITH BREAKFAST.  Marland Kitchen acyclovir (ZOVIRAX) 200 MG capsule TAKE ONE CAPSULE BY MOUTH 5 TIMES A DAY (Patient not taking: Reported on 08/23/2016)  . cetirizine (ZYRTEC) 10 MG tablet Take 1 tablet (10 mg total) by mouth daily as needed for allergies or rhinitis.  . fluticasone (FLONASE) 50 MCG/ACT nasal spray Place 2 sprays into the nose daily.  Marland Kitchen PROAIR HFA 108 (90 Base) MCG/ACT inhaler TAKE 1-2 PUFFS INHALE THREE TIMES A DAY (Patient not taking: Reported on 08/23/2016)  . tamsulosin (FLOMAX) 0.4 MG CAPS capsule Take 2 capsules (0.8 mg total) by mouth daily. (Patient not taking: Reported on 08/23/2016)  . [DISCONTINUED] atorvastatin (LIPITOR) 20 MG tablet TAKE 1/2 TABLET BY MOUTH DAILY AT 6 PM   No facility-administered encounter medications on file as of 08/23/2016.     Activities of Daily Living In your present state of health, do you have any difficulty performing the following activities: 08/23/2016  Hearing? N  Vision? N  Difficulty concentrating or making decisions? N  Walking or climbing stairs? N  Dressing or bathing? N  Doing errands, shopping? N  Preparing Food and eating ? N  Using the Toilet? N  In the past six months, have you accidently leaked urine? N  Do you have problems with loss of bowel control? N  Managing your Medications? N    Managing your Finances? N  Housekeeping or managing your Housekeeping? N  Some recent data might be hidden    Patient Care Team: [name], MD as PCP - General (Family Medicine) Jenel Lucks. Clydene Laming, MD (Ophthalmology) Jessy Oto, MD as Consulting Physician (Orthopedic Surgery)   Assessment:    Physical assessment deferred to PCP.  Exercise Activities and Dietary recommendations Exercise limited by: None identified Diet (meal preparation, eat out, water intake, caffeinated beverages, dairy products, fruits and vegetables): well balanced, on  average, 3 meals per day. Drinks a lot of water.   Goals    . Weight (lb) < 190 lb (86.2 kg)    . Would like to be volunteering      Fall Risk Fall Risk  08/23/2016 02/10/2015  Falls in the past year? Yes No  Number falls in past yr: - 1  Injury with Fall? - No   Depression Screen PHQ 2/9 Scores 08/23/2016 02/10/2015  PHQ - 2 Score 0 1    Cognitive Function MMSE - Mini Mental State Exam 08/23/2016  Orientation to time 5  Orientation to Place 5  Registration 3  Attention/ Calculation 5  Recall 2  Language- name 2 objects 2  Language- repeat 1  Language- follow 3 step command 3  Language- read & follow direction 1  Write a sentence 1  Copy design 1  Total score 29        Immunization History  Administered Date(s) Administered  . Influenza Split 04/23/2012  . Influenza Whole 02/16/2011  . Influenza, High Dose Seasonal PF 05/06/2013, 03/25/2016  . Pneumococcal Conjugate-13 03/25/2016  . Pneumococcal Polysaccharide-23 10/19/2011  . Td 07/18/2008  . Zoster 04/27/2016   Screening Tests Health Maintenance  Topic Date Due  . HEMOGLOBIN A1C  09/22/2016  . OPHTHALMOLOGY EXAM  04/17/2017  . FOOT EXAM  08/23/2017  . TETANUS/TDAP  07/18/2018  . COLONOSCOPY  09/05/2022  . INFLUENZA VACCINE  Completed  . ZOSTAVAX  Completed  . Hepatitis C Screening  Completed  . PNA vac Low Risk Adult  Completed      Plan:     Continue to  eat heart healthy diet (full of fruits, vegetables, whole grains, lean protein, water--limit salt, fat, and sugar intake) and increase physical activity as tolerated.  Bring a copy of your advance directives to your next office visit.  Continue doing brain stimulating activities (puzzles, reading, adult coloring books, staying active) to keep memory sharp.    During the course of the visit the patient was educated and counseled about the following appropriate screening and preventive services:   Vaccines to include Pneumoccal, Influenza, Hepatitis B, Td, Zostavax, HCV  Cardiovascular Disease  Colorectal cancer screening  Diabetes screening  Prostate Cancer Screening  Glaucoma screening  Nutrition counseling   Patient Instructions (the written plan) was given to the patient.    Shela Nevin, [place]  08/23/2016   .RN AWV note reviewed. Agree with documention and plan.  Penni Homans, MD

## 2016-08-22 NOTE — Progress Notes (Signed)
Pre visit review using our clinic review tool, if applicable. No additional management support is needed unless otherwise documented below in the visit note. 

## 2016-08-23 ENCOUNTER — Encounter: Payer: Self-pay | Admitting: *Deleted

## 2016-08-23 ENCOUNTER — Ambulatory Visit (INDEPENDENT_AMBULATORY_CARE_PROVIDER_SITE_OTHER): Payer: Medicare Other | Admitting: *Deleted

## 2016-08-23 VITALS — BP 130/76 | HR 78 | Ht 71.0 in | Wt 203.2 lb

## 2016-08-23 DIAGNOSIS — Z0000 Encounter for general adult medical examination without abnormal findings: Secondary | ICD-10-CM

## 2016-08-23 NOTE — Patient Instructions (Addendum)
Continue to eat heart healthy diet (full of fruits, vegetables, whole grains, lean protein, water--limit salt, fat, and sugar intake) and increase physical activity as tolerated.  Bring a copy of your advance directives to your next office visit.  Continue doing brain stimulating activities (puzzles, reading, adult coloring books, staying active) to keep memory sharp.   Screening for Type 2 Diabetes A screening test for type 2 diabetes (type 2 diabetes mellitus) is a blood test to measure your blood sugar (glucose) level. This test is done to check for early signs of diabetes, before you develop symptoms. Type 2 diabetes is a long-term (chronic) disease that occurs when the pancreas does not make enough of a hormone called insulin. This results in high blood glucose levels, which can cause many complications. You may be screened for type 2 diabetes as part of your regular health care, especially if you have a high risk for diabetes. Screening can help identify type 2 diabetes at its early stage (prediabetes). Identifying and treating prediabetes may delay or prevent development of type 2 diabetes. What are the risk factors for type 2 diabetes? The following factors may make you more likely to develop type 2 diabetes:  Having a parent or sibling (first-degree relative) who has diabetes.  Being overweight or obese.  Being of American-Indian, [place], Hispanic, Latino, Asian, or African-American descent.  Not getting enough exercise.  Being older than 85.  Having a history of diabetes during pregnancy (gestational diabetes).  Having low levels of good cholesterol (HDL-C) or high levels of blood fats (triglycerides).  Having high blood glucose in a previous blood test.  Having high blood pressure.  Having certain diseases or conditions, including:  Acanthosis nigricans. This is a condition that causes dark skin on the neck, armpits, and groin.  Polycystic ovary syndrome  (PCOS).  Heart disease.  Having delivered a baby who weighed more than 9 lb (4.1 kg). Who should be screened for type 2 diabetes? Adults  Adults age 48 and older. These adults should be screened at least once every three years.  Adults who are younger than 31, overweight, and have at least one other risk factor. These adults should be screened at least once every three years.  Adults who have normal blood glucose levels and two or more risk factors. These adults may be screened once every year (annually).  Women who have had gestational diabetes in the past. These women should be screened at least once every three years.  Pregnant women who have risk factors. These women should be screened at their first prenatal visit.  Pregnant women with no risk factors. These women should be screened between weeks 24 and 28 of pregnancy. Children and adolescents  Children and adolescents should be screened for type 2 diabetes if they are overweight and have 2 of the following risk factors:  A family history of type 2 diabetes.  Being a member of a high risk race or ethnic group.  Signs of insulin resistance or conditions associated with insulin resistance.  A mother who had gestational diabetes while pregnant with him or her.  Screening should be done at least once every three years, starting at age 50. Your health care provider or your child's health care provider may recommend having a screening more or less often. What happens during screening? During screening, your health care provider may ask questions about:  Your health and your risk factors, including your activity level and any medical conditions that you have.  The  health of your first-degree relatives.  Past pregnancies, if this applies. Your health care provider will also do a physical exam, including a blood pressure measurement and blood tests. There are four blood tests that can be used to screen for type 2 diabetes. You  may have one or more of the following:  A fasting plasma glucose test (FBG). You will not be allowed to eat for at least eight hours before a blood sample is taken.  A random blood glucose test. This test checks your blood glucose at any time of the day regardless of when you ate.  An oral glucose tolerance test (OGTT). This test measures your blood glucose at two times:  After you have not eaten (have fasted) overnight.  Two hours after you drink a glucose-containing beverage. A diagnosis can be made if the level is greater than 200 mg/dL (11.1 mmol/L).  An A1c test. This test provides information about blood glucose control over the previous three months. What do the results mean? Your test results are a measurement of how much glucose is in your blood. Normal blood glucose levels mean that you do not have diabetes or prediabetes. High blood glucose levels may mean that you have prediabetes or diabetes. Depending on the results, other tests may be needed to confirm the diagnosis. This information is not intended to replace advice given to you by your health care provider. Make sure you discuss any questions you have with your health care provider. Document Released: 04/30/2009 Document Revised: 12/10/2015 Document Reviewed: 05/01/2015 Elsevier Interactive Patient Education  2017 [employer].

## 2016-08-31 ENCOUNTER — Other Ambulatory Visit (INDEPENDENT_AMBULATORY_CARE_PROVIDER_SITE_OTHER): Payer: Self-pay | Admitting: Specialist

## 2016-09-05 NOTE — Telephone Encounter (Signed)
Tramadol refill

## 2016-09-12 ENCOUNTER — Other Ambulatory Visit: Payer: Self-pay | Admitting: Family Medicine

## 2016-09-12 NOTE — Telephone Encounter (Signed)
Last office visit on 03/25/2016 Next scheduled appt is on 01/16/2017 Last refill on 05/31/2016   #30 with 1 refill Contract signed on 10/20/2014  No UDS.

## 2016-09-12 NOTE — Telephone Encounter (Signed)
He can have a 30 day supply but no more because it will be over 6 months since seen. Update UDS and contract

## 2016-09-13 ENCOUNTER — Encounter: Payer: Self-pay | Admitting: Family Medicine

## 2016-09-13 NOTE — Telephone Encounter (Signed)
Printed/PCP signed/called patient informed of instructions by PCP and he agreed to. Has appt already scheduled on 01/16/2017

## 2016-09-15 ENCOUNTER — Encounter: Payer: Self-pay | Admitting: Family Medicine

## 2016-09-15 DIAGNOSIS — Z79899 Other long term (current) drug therapy: Secondary | ICD-10-CM | POA: Diagnosis not present

## 2016-09-22 ENCOUNTER — Other Ambulatory Visit: Payer: Self-pay | Admitting: Family Medicine

## 2016-09-22 MED ORDER — SILDENAFIL CITRATE 20 MG PO TABS: 20.0000 mg | ORAL_TABLET | Freq: Three times a day (TID) | ORAL | 3 refills | Status: DC | PRN

## 2016-09-22 NOTE — Telephone Encounter (Signed)
Will print out if ok let me know

## 2016-09-22 NOTE — Telephone Encounter (Signed)
Caller name: Relationship to patient: Self Can be reached: [phone] Pharmacy:  Reason for call: Request a hard copy Rx of sildenafil (REVATIO) 20 MG tablet so that he can use his GoodRx coupon and save about $100.

## 2016-09-23 ENCOUNTER — Other Ambulatory Visit: Payer: Self-pay | Admitting: Family Medicine

## 2016-09-23 NOTE — Telephone Encounter (Signed)
Called left detailed message on number to pickup hardcopy as requested at front desk.

## 2016-09-23 NOTE — Telephone Encounter (Signed)
Faxed hardcopy for Testosterone to [facility]

## 2016-09-23 NOTE — Telephone Encounter (Signed)
We need to recheck testosterone. OK to refill in reg PCP's absence. TY.

## 2016-09-23 NOTE — Telephone Encounter (Signed)
Last lab 04/04/16 and office visit on the same day.

## 2016-10-03 ENCOUNTER — Ambulatory Visit (INDEPENDENT_AMBULATORY_CARE_PROVIDER_SITE_OTHER): Payer: Medicare Other

## 2016-10-03 ENCOUNTER — Ambulatory Visit (INDEPENDENT_AMBULATORY_CARE_PROVIDER_SITE_OTHER): Payer: Medicare Other | Admitting: Specialist

## 2016-10-03 ENCOUNTER — Encounter (INDEPENDENT_AMBULATORY_CARE_PROVIDER_SITE_OTHER): Payer: Self-pay | Admitting: Specialist

## 2016-10-03 VITALS — BP 152/80 | HR 67 | Ht 71.0 in | Wt 200.0 lb

## 2016-10-03 DIAGNOSIS — M545 Low back pain: Secondary | ICD-10-CM

## 2016-10-03 DIAGNOSIS — M960 Pseudarthrosis after fusion or arthrodesis: Secondary | ICD-10-CM | POA: Diagnosis not present

## 2016-10-03 DIAGNOSIS — M4325 Fusion of spine, thoracolumbar region: Secondary | ICD-10-CM

## 2016-10-03 DIAGNOSIS — M25561 Pain in right knee: Secondary | ICD-10-CM

## 2016-10-03 MED ORDER — METHOCARBAMOL 500 MG PO TABS: 500.0000 mg | ORAL_TABLET | Freq: Three times a day (TID) | ORAL | 0 refills | Status: DC | PRN

## 2016-10-03 NOTE — Patient Instructions (Addendum)
Avoid frequent bending and stooping  No lifting greater than 10 lbs. May use ice or moist heat for pain. Weight loss is of benefit. 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 Obtain a CT Scan through L5-S1 to assess healing of ALIF L5-S1 .

## 2016-10-03 NOTE — Progress Notes (Addendum)
Office Visit Note   Patient: Luis Brown           Date of Birth: [birth year]           MRN: [id number] Visit Date: 10/03/2016              Requested by: [name], MD [address] [place], [place] [address] PCP: Penni Homans, MD   Assessment & Plan: Visit Diagnoses:  1. Right knee pain, unspecified chronicity   2. Low back pain, unspecified back pain laterality, unspecified chronicity, with sciatica presence unspecified   3. Fusion of spine of thoracolumbar region   4. Pseudarthrosis after fusion or arthrodesis     Plan: To try to help give patient some relief of his right knee pain I offered injection.  After patient consent and knee was prepped intra-articular Marcaine/Depo-Medrol 6 and 1 injection performed. Tolerated without complication. Patient does have history of diabetes.advised them to strictly monitor his blood sugars next couple of days. We'll also monitor his diet due to the potential risk of the cortisone causing elevation of his blood sugars. Voices understanding. Fingerstick glucose today in the clinic 122. For his back symptoms and left lower extremity radicular pain he was given prescription for Robaxin. Can continue tramadol that he already has. Follow-up in 6 weeks for recheck. Obtain a CT Scan through L5-S1 to assess healing of ALIF L5-S1 .Return in about 6 weeks (around 11/14/2016).   Orders:  Orders Placed This Encounter  Procedures  . XR Knee 1-2 Views Right  . XR Lumbar Spine 2-3 Views  . CT LUMBAR SPINE WO CONTRAST   Meds ordered this encounter  Medications  . methocarbamol (ROBAXIN) 500 MG tablet    Sig: Take 1 tablet (500 mg total) by mouth every 8 (eight) hours as needed for muscle spasms.    Dispense:  50 tablet    Refill:  0      Procedures: No procedures performed   Clinical Data: No additional findings.   Subjective: Chief Complaint  Patient presents with  . Left Leg - Pain    [name] is here for leeft leg  pain.  He states that he is having numbness in the left heel and in the left buttock.  He will move a certain way and the left leg feels like it disappears for a few seconds. He says that he hurts if he stands still to long.  He is also requesting an injection in his right knee today.   States that he has been having low back pain and left leg pain numbness and tingling for a couple months. No injury. States that he had been doing very well. Has had multilevel fusion. No radicular symptoms on the right although he does have right knee pain. Knee pain 2 weeks. No injury. States that it started after she had been doing a lot of walking at a game.  Intermittently taking tramadol.. Review of Systems  Constitutional: Positive for activity change.  HENT: Negative.   Respiratory: Negative.   Cardiovascular: Negative.   Genitourinary: Negative.   Neurological: Positive for numbness.  Psychiatric/Behavioral: Negative.      Objective: Vital Signs: BP (!) 152/80 (BP Location: Left Arm, Patient Position: Sitting)   Pulse 67   Ht 5\' 11"  (1.803 m)   Wt 200 lb (90.7 kg)   BMI 27.89 kg/m   Physical Exam  Constitutional: No distress.  HENT:  Head: Normocephalic and atraumatic.  Eyes: EOM  are normal. Pupils are equal, round, and reactive to light.  Musculoskeletal:  Gait is minimally antalgic. Bilateral lumbar paraspinal tenderness. Mild left sciatic notch tenderness. Mild tenderness over the left hip greater trochanter bursa. Negative on the right side. Negative straight leg raise. Negative logroll bilateral hips. Right knee good range of motion. Some discomfort with McMurray's testing lateral compartment. Moderately tender over the lateral joint line. Cruciate collateral ligaments are stable. No palpable effusion. Bilateral calves are nontender.    Ortho Exam  Specialty Comments:  No specialty comments available.  Imaging: No results found.   PMFS History: Patient Active Problem List    Diagnosis Date Noted  . Esophageal reflux 02/10/2015  . Medicare annual wellness visit, subsequent 02/10/2015  . Personal history of colonic polyps 10/27/2012  . 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  . BPH (benign prostatic hyperplasia) 04/23/2012  . ED (erectile dysfunction) 04/23/2012  . Allergic state 10/25/2011  . Anxiety and depression 10/25/2011  . Asthma   . Hyperglycemia   . Hyperlipidemia   . Overweight   . Insomnia   . Fatigue   . Preventative health care   . DDD (degenerative disc disease), lumbosacral   . HTN (hypertension)    Past Medical History:  Diagnosis Date  . Acute pharyngitis 10/21/2013  . Allergy    grass and pollen  . Anxiety and depression 10/25/2011  . Asthma    exercise induced  . BPH (benign prostatic hyperplasia) 04/23/2012  . Chicken pox as a child  . DDD (degenerative disc disease)    cervical responds to steroid injections and low back required surgery  . DDD (degenerative disc disease), lumbosacral   . Diabetes mellitus    pre  . ED (erectile dysfunction) 04/23/2012  . Elevated BP   . Esophageal reflux 02/10/2015  . Fatigue   . HTN (hypertension)   . Hyperglycemia    preDM   . Hyperlipidemia   . Insomnia   . Measles as a child  . Medicare annual wellness visit, subsequent 02/10/2015  . Overweight(278.02)   . Personal history of colonic polyps 10/27/2012   Follows with [facility]  . Preventative health care   . 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    Family History  Problem Relation Age of Onset  . Hypertension Mother   . Diabetes Mother     type 2  . Cancer Mother 5    breast in remission  . Emphysema Father     smoker  . COPD Father     smoker  . Stroke Father 61    mini  . Heart disease Father   . Hypertension Sister   . Hyperlipidemia Sister   . Scoliosis Sister   . Osteoporosis Sister   . Hypertension Maternal Grandmother   . Scoliosis Maternal Grandmother   . Heart disease Maternal Grandfather   . Heart  disease Paternal Grandfather     smoker  . Heart disease Daughter     Past Surgical History:  Procedure Laterality Date  . BACK SURGERY  2012 and 1994   Dr Margret Chance, screws and cage in low back  . TONSILLECTOMY    . torn rotator cuff  2010   right   Social History   Occupational History  . Not on file.   Social History Main Topics  . Smoking status: Former Smoker    Packs/day: 1.00    Years: 20.00    Types: Cigarettes    Quit date: 07/18/1989  . Smokeless tobacco: Never Used  Comment: off and on smoking for the 20 yrs  . Alcohol use Yes     Comment: drink or two a day- mixed drink  . Drug use: No  . Sexual activity: Yes     Comment: lives with wife, still working, no dietary restrictions, continues to exercise intermittently

## 2016-10-04 ENCOUNTER — Ambulatory Visit: Payer: Self-pay

## 2016-10-07 ENCOUNTER — Other Ambulatory Visit (INDEPENDENT_AMBULATORY_CARE_PROVIDER_SITE_OTHER): Payer: Self-pay | Admitting: Specialist

## 2016-10-07 ENCOUNTER — Ambulatory Visit
Admission: RE | Admit: 2016-10-07 | Discharge: 2016-10-07 | Disposition: A | Discharge status: Home / Self Care | Payer: Medicare Other | Source: Ambulatory Visit | Attending: Specialist | Admitting: Specialist

## 2016-10-07 DIAGNOSIS — M48061 Spinal stenosis, lumbar region without neurogenic claudication: Secondary | ICD-10-CM | POA: Diagnosis not present

## 2016-10-07 DIAGNOSIS — M545 Low back pain: Secondary | ICD-10-CM

## 2016-10-07 NOTE — Progress Notes (Unsigned)
I called and spoke with  Mr. Kalief Kattner, I reviewed the CT report. He is wanting to know if anything can be done to improve his situation. I would like to see him and show him the studies so he can be given some options for treatment of his spine. 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 Loosening of both iliac wing screws with broken rods bilaterally at L5-S1, vacuum sign L5-S1 disc area post interbody fusion with interbody cage fixation screw into  S1 broken. Overall the disc space height remains elevated and there is some graft bridging over the posterior disc space.  The left iliac wing screw does appear to course within the left SI joint over a distance of 10-15 mm.

## 2016-10-10 NOTE — Progress Notes (Signed)
Can you please call pt and sched him Thursday in the afternoon to review scan? Thanks

## 2016-10-13 ENCOUNTER — Encounter (INDEPENDENT_AMBULATORY_CARE_PROVIDER_SITE_OTHER): Payer: Self-pay | Admitting: Specialist

## 2016-10-13 ENCOUNTER — Ambulatory Visit (INDEPENDENT_AMBULATORY_CARE_PROVIDER_SITE_OTHER): Payer: Medicare Other | Admitting: Specialist

## 2016-10-13 ENCOUNTER — Ambulatory Visit (INDEPENDENT_AMBULATORY_CARE_PROVIDER_SITE_OTHER): Payer: Self-pay | Admitting: Specialist

## 2016-10-13 VITALS — BP 130/77 | HR 56 | Ht 71.0 in | Wt 200.0 lb

## 2016-10-13 DIAGNOSIS — M533 Sacrococcygeal disorders, not elsewhere classified: Secondary | ICD-10-CM

## 2016-10-13 DIAGNOSIS — M960 Pseudarthrosis after fusion or arthrodesis: Secondary | ICD-10-CM | POA: Diagnosis not present

## 2016-10-13 DIAGNOSIS — M5416 Radiculopathy, lumbar region: Secondary | ICD-10-CM | POA: Diagnosis not present

## 2016-10-13 DIAGNOSIS — M4804 Spinal stenosis, thoracic region: Secondary | ICD-10-CM | POA: Diagnosis not present

## 2016-10-13 DIAGNOSIS — M5134 Other intervertebral disc degeneration, thoracic region: Secondary | ICD-10-CM

## 2016-10-13 NOTE — Progress Notes (Signed)
Office Visit Note   Patient: Luis Brown           Date of Birth: [birth year]           MRN: [id number] Visit Date: 10/13/2016              Requested by: [name], MD [address] [place], [place] [address] PCP: Penni Homans, MD   Assessment & Plan: Visit Diagnoses:  1. Sacroiliac pain   2. Pseudarthrosis after fusion or arthrodesis   3. Thoracic spinal stenosis   4. Thoracic degenerative disc disease   5. Chronic left lumbar radiculopathy     Plan:Avoid bending, stooping and avoid lifting weights greater than 10 lbs. Avoid prolong standing and walking. Avoid frequent bending and stooping  No lifting greater than 10 lbs. May use ice or moist heat for pain. Weight loss is of benefit. 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 Dr. Romona Curls secretary/Assistant will call to arrange for left SI joint injection injection    Follow-Up Instructions: Return in about 4 weeks (around 11/10/2016).   Orders:  No orders of the defined types were placed in this encounter.  No orders of the defined types were placed in this encounter.     Procedures: No procedures performed   Clinical Data: No additional findings.   Subjective: Chief Complaint  Patient presents with  . Lower Back - Follow-up    Post CT Scan    Luis Brown is here to review his CT scan of his Lumbar spine. He states that he has got some relief with the muscle relaxer that he was prescribed. He has some feeling back in his foot again due to the muscle relaxer.  Otherwise he is doing the same.    Review of Systems  HENT: Negative.   Eyes: Negative.   Respiratory: Negative.   Cardiovascular: Negative.   Gastrointestinal: Negative.   Endocrine: Negative.   Genitourinary: Negative.   Musculoskeletal: Positive for back pain.  Skin: Negative.   Allergic/Immunologic: Negative.   Neurological: Positive for weakness and numbness.  Hematological: Negative.   Psychiatric/Behavioral: Negative.       Objective: Vital Signs: BP 130/77 (BP Location: Left Arm, Patient Position: Sitting)   Pulse (!) 56   Ht 5\' 11"  (1.803 m)   Wt 200 lb (90.7 kg)   BMI 27.89 kg/m   Physical Exam  Constitutional: He is oriented to person, place, and time. He appears well-developed and well-nourished.  HENT:  Head: Normocephalic and atraumatic.  Eyes: EOM are normal. Pupils are equal, round, and reactive to light.  Neck: Normal range of motion. Neck supple.  Pulmonary/Chest: Effort normal and breath sounds normal.  Abdominal: Soft. Bowel sounds are normal.  Neurological: He is alert and oriented to person, place, and time.  Skin: Skin is warm and dry.  Psychiatric: He has a normal mood and affect. His behavior is normal. Judgment and thought content normal.    Back Exam   Tenderness  The patient is experiencing tenderness in the lumbar.  Range of Motion  Extension: abnormal  Flexion: abnormal   Muscle Strength  Right Quadriceps:  5/5  Left Quadriceps:  5/5  Right Hamstrings:  5/5  Left Hamstrings:  5/5   Other  Toe Walk: normal Heel Walk: normal Sensation: decreased  Comments:  Left EHL 1/5 Left hip flexion with minimal pain left SI and figure of 4 negative.       Specialty Comments:  No specialty comments available.  Imaging: No results found.   PMFS History: Patient Active Problem List   Diagnosis Date Noted  . Esophageal reflux 02/10/2015  . Medicare annual wellness visit, subsequent 02/10/2015  . Personal history of colonic polyps 10/27/2012  . 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  . BPH (benign prostatic hyperplasia) 04/23/2012  . ED (erectile dysfunction) 04/23/2012  . Allergic state 10/25/2011  . Anxiety and depression 10/25/2011  . Asthma   . Hyperglycemia   . Hyperlipidemia   . Overweight   . Insomnia   . Fatigue   . Preventative health care   . DDD (degenerative disc disease), lumbosacral   . HTN (hypertension)    Past Medical History:   Diagnosis Date  . Acute pharyngitis 10/21/2013  . Allergy    grass and pollen  . Anxiety and depression 10/25/2011  . Asthma    exercise induced  . BPH (benign prostatic hyperplasia) 04/23/2012  . Chicken pox as a child  . DDD (degenerative disc disease)    cervical responds to steroid injections and low back required surgery  . DDD (degenerative disc disease), lumbosacral   . Diabetes mellitus    pre  . ED (erectile dysfunction) 04/23/2012  . Elevated BP   . Esophageal reflux 02/10/2015  . Fatigue   . HTN (hypertension)   . Hyperglycemia    preDM   . Hyperlipidemia   . Insomnia   . Measles as a child  . Medicare annual wellness visit, subsequent 02/10/2015  . Overweight(278.02)   . Personal history of colonic polyps 10/27/2012   Follows with [facility]  . Preventative health care   . 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    Family History  Problem Relation Age of Onset  . Hypertension Mother   . Diabetes Mother     type 2  . Cancer Mother 28    breast in remission  . Emphysema Father     smoker  . COPD Father     smoker  . Stroke Father 61    mini  . Heart disease Father   . Hypertension Sister   . Hyperlipidemia Sister   . Scoliosis Sister   . Osteoporosis Sister   . Hypertension Maternal Grandmother   . Scoliosis Maternal Grandmother   . Heart disease Maternal Grandfather   . Heart disease Paternal Grandfather     smoker  . Heart disease Daughter     Past Surgical History:  Procedure Laterality Date  . BACK SURGERY  2012 and 1994   Dr Margret Chance, screws and cage in low back  . TONSILLECTOMY    . torn rotator cuff  2010   right   Social History   Occupational History  . Not on file.   Social History Main Topics  . Smoking status: Former Smoker    Packs/day: 1.00    Years: 20.00    Types: Cigarettes    Quit date: 07/18/1989  . Smokeless tobacco: Never Used     Comment: off and on smoking for the 20 yrs  . Alcohol use Yes     Comment: drink or  two a day- mixed drink  . Drug use: No  . Sexual activity: Yes     Comment: lives with wife, still working, no dietary restrictions, continues to exercise intermittently

## 2016-10-13 NOTE — Patient Instructions (Signed)
Avoid bending, stooping and avoid lifting weights greater than 10 lbs. Avoid prolong standing and walking. Avoid frequent bending and stooping  No lifting greater than 10 lbs. May use ice or moist heat for pain. Weight loss is of benefit. 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 Dr. Romona Curls secretary/Assistant will call to arrange for left SI joint injection injection

## 2016-10-19 ENCOUNTER — Ambulatory Visit: Payer: Medicare Other

## 2016-10-19 DIAGNOSIS — E349 Endocrine disorder, unspecified: Secondary | ICD-10-CM

## 2016-10-19 MED ORDER — TESTOSTERONE CYPIONATE 100 MG/ML IM SOLN
200.0000 mg | Freq: Once | INTRAMUSCULAR | Status: AC
  Administered 2016-10-19: 200 mg via INTRAMUSCULAR

## 2016-10-19 NOTE — Progress Notes (Signed)
Pre visit review using our clinic tool,if applicable. No additional management support is needed unless otherwise documented below in the visit note.   Patient in for Testosterone injection due to Testosterone deficiency per order from Dr. Willette Alma, MD.  Given 200 mg IM Right ventrogluteal. Patient tolerated well. States he will call back to schedule next appointment.

## 2016-10-21 ENCOUNTER — Other Ambulatory Visit: Payer: Self-pay | Admitting: Family Medicine

## 2016-10-21 MED ORDER — VENLAFAXINE HCL ER 75 MG PO CP24: 75.0000 mg | ORAL_CAPSULE | Freq: Every day | ORAL | 0 refills | Status: DC

## 2016-10-26 ENCOUNTER — Ambulatory Visit (INDEPENDENT_AMBULATORY_CARE_PROVIDER_SITE_OTHER): Payer: Medicare Other | Admitting: Physical Medicine and Rehabilitation

## 2016-10-26 ENCOUNTER — Ambulatory Visit (INDEPENDENT_AMBULATORY_CARE_PROVIDER_SITE_OTHER): Payer: Medicare Other

## 2016-10-26 ENCOUNTER — Encounter (INDEPENDENT_AMBULATORY_CARE_PROVIDER_SITE_OTHER): Payer: Self-pay | Admitting: Physical Medicine and Rehabilitation

## 2016-10-26 VITALS — BP 148/80

## 2016-10-26 DIAGNOSIS — M461 Sacroiliitis, not elsewhere classified: Secondary | ICD-10-CM

## 2016-10-26 NOTE — Progress Notes (Signed)
[name] - 71 y.o. male MRN [id number]  Date of birth: 06/23/1946  Office Visit Note: Visit Date: 10/26/2016 PCP: Penni Homans, MD Referred by: [name], MD  Subjective: Chief Complaint  Patient presents with  . Lower Back - Pain   HPI: Luis Brown is a 71 year old gentleman with complicated spine history with chronic constant left leg paresthesia but with worsening with certain types of movement with buttock pain radiating down. Dr. Louanne Skye requests diagnostic left sacroiliac joint injection. The patient does have a sacral fixating screw through the area of the sacroiliac joint..    ROS Otherwise per HPI.  Assessment & Plan: Visit Diagnoses:  1. Sacroiliitis ([place])     Plan: No additional findings.   Meds & Orders: No orders of the defined types were placed in this encounter.   Orders Placed This Encounter  Procedures  . Large Joint Injection/Arthrocentesis  . XR C-ARM NO REPORT    Follow-up: Return for Dr. Louanne Skye as scheduled.   Procedures: Sacroiliac joint injection with fluoroscopic guidance Date/Time: 10/26/2016 10:03 AM Performed by: Magnus Sinning Authorized by: Magnus Sinning   Consent Given by:  Patient Site marked: the procedure site was marked   Timeout: prior to procedure the correct patient, procedure, and site was verified   Indications:  Pain and diagnostic evaluation Location:  Sacroiliac Site:  L sacroiliac joint Prep: patient was prepped and draped in usual sterile fashion   Needle Size:  22 G Needle Length:  3.5 inches Approach:  Posterior Ultrasound Guidance: No   Fluoroscopic Guidance: Yes   Arthrogram: No   Medications:  2 mL bupivacaine 0.5 %; 80 mg methylPREDNISolone acetate 80 MG/ML Aspiration Attempted: No   Patient tolerance:  Patient tolerated the procedure well with no immediate complications  There was excellent flow of contrast producing a partial arthrogram of the sacroiliac joint. Patient did not endorse much relief  with the anesthetic phase. He does state that he needs to move around and do things before he notices much difference.     No notes on file   Clinical History: IMPRESSION: T10 screws show loosening. Screws breech the superior cortex and enter the T9-10 disc. Advanced T9-10 disc degeneration with nitrogen gas and endplate cystic changes. Facet and ligamentous hypertrophy. Stenosis at this level that could be significant.  T10-11 stenosis due to bulging of the disc in combination with facet and ligamentous calcification. This stenosis could be significant.  No evidence of screw loosening from T11 through L5.  Posterior rod discontinuity bilaterally at L5-S1. Lucency around the S1 screws. Gas along the margin of the interbody device consistent with nonunion/motion. Fracture of the interbody anchor screw.  Pronounced screw loosening around the iliac screws indicating motion. Left-sided screw travels considerable distance within the sacroiliac joint.   Electronically Signed   By: Nelson Chimes M.D.   On: 10/07/2016 14:50  He reports that he quit smoking about 27 years ago. His smoking use included Cigarettes. He has a 20.00 pack-year smoking history. He has never used smokeless tobacco.   Recent Labs  03/25/16 0840  HGBA1C 6.2    Objective:  VS:  HT:    WT:   BMI:     BP:(!) 148/80  HR: bpm  TEMP: ( )  RESP:  Physical Exam  Ortho Exam Imaging: Xr C-arm No Report  Result Date: 10/26/2016 Please see Notes or Procedures tab for imaging impression.   Past Medical/Family/Surgical/Social History: Medications & Allergies reviewed per EMR Patient Active Problem  List   Diagnosis Date Noted  . Esophageal reflux 02/10/2015  . Medicare annual wellness visit, subsequent 02/10/2015  . Personal history of colonic polyps 10/27/2012  . 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  . BPH (benign prostatic hyperplasia) 04/23/2012  . ED (erectile dysfunction) 04/23/2012  . Allergic  state 10/25/2011  . Anxiety and depression 10/25/2011  . Asthma   . Hyperglycemia   . Hyperlipidemia   . Overweight   . Insomnia   . Fatigue   . Preventative health care   . DDD (degenerative disc disease), lumbosacral   . HTN (hypertension)    Past Medical History:  Diagnosis Date  . Acute pharyngitis 10/21/2013  . Allergy    grass and pollen  . Anxiety and depression 10/25/2011  . Asthma    exercise induced  . BPH (benign prostatic hyperplasia) 04/23/2012  . Chicken pox as a child  . DDD (degenerative disc disease)    cervical responds to steroid injections and low back required surgery  . DDD (degenerative disc disease), lumbosacral   . Diabetes mellitus    pre  . ED (erectile dysfunction) 04/23/2012  . Elevated BP   . Esophageal reflux 02/10/2015  . Fatigue   . HTN (hypertension)   . Hyperglycemia    preDM   . Hyperlipidemia   . Insomnia   . Measles as a child  . Medicare annual wellness visit, subsequent 02/10/2015  . Overweight(278.02)   . Personal history of colonic polyps 10/27/2012   Follows with [facility]  . Preventative health care   . 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   Family History  Problem Relation Age of Onset  . Hypertension Mother   . Diabetes Mother     type 2  . Cancer Mother 76    breast in remission  . Emphysema Father     smoker  . COPD Father     smoker  . Stroke Father 51    mini  . Heart disease Father   . Hypertension Sister   . Hyperlipidemia Sister   . Scoliosis Sister   . Osteoporosis Sister   . Hypertension Maternal Grandmother   . Scoliosis Maternal Grandmother   . Heart disease Maternal Grandfather   . Heart disease Paternal Grandfather     smoker  . Heart disease Daughter    Past Surgical History:  Procedure Laterality Date  . BACK SURGERY  2012 and 1994   Dr Margret Chance, screws and cage in low back  . TONSILLECTOMY    . torn rotator cuff  2010   right   Social History   Occupational History  . Not on  file.   Social History Main Topics  . Smoking status: Former Smoker    Packs/day: 1.00    Years: 20.00    Types: Cigarettes    Quit date: 07/18/1989  . Smokeless tobacco: Never Used     Comment: off and on smoking for the 20 yrs  . Alcohol use Yes     Comment: drink or two a day- mixed drink  . Drug use: No  . Sexual activity: Yes     Comment: lives with wife, still working, no dietary restrictions, continues to exercise intermittently

## 2016-10-26 NOTE — Patient Instructions (Signed)

## 2016-10-27 MED ORDER — METHYLPREDNISOLONE ACETATE 80 MG/ML IJ SUSP
80.0000 mg | INTRAMUSCULAR | Status: AC | PRN
  Administered 2016-10-26: 80 mg via INTRA_ARTICULAR

## 2016-10-27 MED ORDER — BUPIVACAINE HCL 0.5 % IJ SOLN
2.0000 mL | INTRAMUSCULAR | Status: AC | PRN
Start: 2016-10-26 — End: 2016-10-26
  Administered 2016-10-26: 2 mL via INTRA_ARTICULAR

## 2016-10-30 ENCOUNTER — Other Ambulatory Visit (INDEPENDENT_AMBULATORY_CARE_PROVIDER_SITE_OTHER): Payer: Self-pay | Admitting: Surgery

## 2016-10-31 NOTE — Telephone Encounter (Signed)
Robaxin refill request 

## 2016-11-04 ENCOUNTER — Other Ambulatory Visit: Payer: Self-pay

## 2016-11-04 MED ORDER — CELECOXIB 200 MG PO CAPS: 200.0000 mg | ORAL_CAPSULE | Freq: Every day | ORAL | 1 refills | Status: DC

## 2016-11-07 ENCOUNTER — Other Ambulatory Visit (INDEPENDENT_AMBULATORY_CARE_PROVIDER_SITE_OTHER): Payer: Self-pay | Admitting: Specialist

## 2016-11-07 ENCOUNTER — Other Ambulatory Visit: Payer: Self-pay | Admitting: Family Medicine

## 2016-11-07 MED ORDER — CELECOXIB 200 MG PO CAPS: 200.0000 mg | ORAL_CAPSULE | Freq: Every day | ORAL | 0 refills | Status: DC

## 2016-11-07 NOTE — Telephone Encounter (Signed)
Patient called asking for his Celebrex refill to be sent to the [facility] on [address]. He no longer is using [facility]. CB # [phone]

## 2016-11-07 NOTE — Addendum Note (Signed)
Addended by: Minda Ditto, Geoffery Spruce on: 11/07/2016 04:45 PM   Modules accepted: Orders

## 2016-11-08 ENCOUNTER — Ambulatory Visit (INDEPENDENT_AMBULATORY_CARE_PROVIDER_SITE_OTHER): Payer: Medicare Other

## 2016-11-08 DIAGNOSIS — E349 Endocrine disorder, unspecified: Secondary | ICD-10-CM | POA: Diagnosis not present

## 2016-11-08 DIAGNOSIS — R7989 Other specified abnormal findings of blood chemistry: Secondary | ICD-10-CM

## 2016-11-08 MED ORDER — TESTOSTERONE CYPIONATE 200 MG/ML IM SOLN
200.0000 mg | Freq: Once | INTRAMUSCULAR | Status: AC
  Administered 2016-11-08: 200 mg via INTRAMUSCULAR

## 2016-11-08 NOTE — Progress Notes (Signed)
Pre visit review using our clinic tool,if applicable. No additional management support is needed unless otherwise documented below in the visit note.   Patient in for Testosterone injection per order from Dr. Frederik Pear due to low Testosterone level.  Given Testosterone 200 mg IM right hip. Patient tolerated well. No complaints voiced today from patient  Patient will call if another appointment will be needed. Injections sometimes given at his home by daughter-in-law.

## 2016-11-08 NOTE — Telephone Encounter (Signed)
I called to advise patient that his rx was sent to [facility] Last night by Dr. Louanne Skye.

## 2016-11-20 ENCOUNTER — Other Ambulatory Visit: Payer: Self-pay | Admitting: Family Medicine

## 2016-11-22 ENCOUNTER — Other Ambulatory Visit: Payer: Self-pay

## 2016-11-22 ENCOUNTER — Ambulatory Visit (INDEPENDENT_AMBULATORY_CARE_PROVIDER_SITE_OTHER): Payer: Medicare Other

## 2016-11-22 DIAGNOSIS — E349 Endocrine disorder, unspecified: Secondary | ICD-10-CM | POA: Diagnosis not present

## 2016-11-22 MED ORDER — TESTOSTERONE CYPIONATE 200 MG/ML IM SOLN: 200.0000 mg | Freq: Once | INTRAMUSCULAR | 0 refills | Status: DC

## 2016-11-22 MED ORDER — VENLAFAXINE HCL ER 75 MG PO CP24: 75.0000 mg | ORAL_CAPSULE | Freq: Every day | ORAL | 0 refills | Status: DC

## 2016-11-22 MED ORDER — TESTOSTERONE CYPIONATE 100 MG/ML IM SOLN
200.0000 mg | Freq: Once | INTRAMUSCULAR | Status: AC
  Administered 2016-11-22: 200 mg via INTRAMUSCULAR

## 2016-11-22 MED ORDER — METFORMIN HCL 500 MG PO TABS
500.0000 mg | ORAL_TABLET | Freq: Every day | ORAL | 1 refills | Status: DC
Start: 2016-11-22 — End: 2017-02-23

## 2016-11-22 NOTE — Progress Notes (Signed)
Pre visit review using our clinic tool,if applicable. No additional management support is needed unless otherwise documented below in the visit note.   Patient in for Testosterone Injection per order from Dr. Frederik Pear. Given IM Right Hip. No complaints voiced.   Return appointment given for 2 weeks.

## 2016-11-25 ENCOUNTER — Telehealth: Payer: Self-pay | Admitting: Family Medicine

## 2016-11-25 MED ORDER — TAMSULOSIN HCL 0.4 MG PO CAPS: 0.8000 mg | ORAL_CAPSULE | Freq: Every day | ORAL | 1 refills | Status: DC

## 2016-11-25 NOTE — Telephone Encounter (Signed)
Printed prescription and on counter for PCP to sign on Monday 11/28/2016 Patient contacted to pickup hardcopy on Monday.

## 2016-11-25 NOTE — Telephone Encounter (Signed)
Pt would like to have a hard copy Rx for tamsulosin (FLOMAX) 0.4 MG. He said that he is going to switch pharmacies and will take the hardcopy to the different pharmacy.    Please call pt when ready for pick up.    Phone : [phone]

## 2016-11-29 ENCOUNTER — Other Ambulatory Visit (INDEPENDENT_AMBULATORY_CARE_PROVIDER_SITE_OTHER): Payer: Self-pay | Admitting: Specialist

## 2016-11-29 NOTE — Telephone Encounter (Signed)
Methocarbamol refill request 

## 2016-12-01 ENCOUNTER — Encounter (INDEPENDENT_AMBULATORY_CARE_PROVIDER_SITE_OTHER): Payer: Self-pay | Admitting: Specialist

## 2016-12-01 ENCOUNTER — Ambulatory Visit (INDEPENDENT_AMBULATORY_CARE_PROVIDER_SITE_OTHER): Payer: Medicare Other | Admitting: Specialist

## 2016-12-01 VITALS — BP 120/66 | HR 79 | Ht 71.0 in | Wt 200.0 lb

## 2016-12-01 DIAGNOSIS — M5417 Radiculopathy, lumbosacral region: Secondary | ICD-10-CM | POA: Diagnosis not present

## 2016-12-01 DIAGNOSIS — M533 Sacrococcygeal disorders, not elsewhere classified: Secondary | ICD-10-CM | POA: Diagnosis not present

## 2016-12-01 DIAGNOSIS — M960 Pseudarthrosis after fusion or arthrodesis: Secondary | ICD-10-CM

## 2016-12-01 DIAGNOSIS — T8484XA Pain due to internal orthopedic prosthetic devices, implants and grafts, initial encounter: Secondary | ICD-10-CM | POA: Diagnosis not present

## 2016-12-01 NOTE — Progress Notes (Signed)
Office Visit Note   Patient: Luis Brown           Date of Birth: 12-25-1945           MRN: [id number] Visit Date: 12/01/2016              Requested by: [name], MD [address] [place], [place] [address] PCP: [name], MD   Assessment & Plan: Visit Diagnoses:  1. Left lumbosacral radiculopathy   2. Pseudarthrosis after fusion or arthrodesis   3. Pain of left sacroiliac joint   4. Painful orthopaedic hardware [facility])     Plan: Avoid frequent bending and stooping  No lifting greater than 10 lbs. May use ice or moist heat for pain. Weight loss is of benefit. MRi of the lumbar spine with and without contrast ordered.     Follow-Up Instructions: No Follow-up on file.   Orders:  Orders Placed This Encounter  Procedures  . MR Lumbar Spine W Wo Contrast   No orders of the defined types were placed in this encounter.     Procedures: No procedures performed   Clinical Data: No additional findings.   Subjective: Chief Complaint  Patient presents with  . Lower Back - Follow-up    Constant numbness in the left leg, heaviness, occasionally with tripping and weakness in the left leg. He occasionally stumps his toe. Assistant, Benjiman Core PA-C spoke to him about injections. SI injection did help much. No real pain relief with this. Not painful, just disfunctional. Occasionally with sciatica But not continuous. Bowel and bladder is okay but takes meds for improving stream, Dr. Aliene Beams.     Review of Systems  Constitutional: Negative.   HENT: Negative.   Eyes: Negative.   Respiratory: Negative.   Cardiovascular: Negative.   Gastrointestinal: Negative.   Endocrine: Negative.   Genitourinary: Negative.   Musculoskeletal: Negative.   Skin: Negative.   Allergic/Immunologic: Negative.   Neurological: Negative.   Hematological: Negative.   Psychiatric/Behavioral: Negative.      Objective: Vital Signs: BP 120/66 (BP Location:  Left Arm, Patient Position: Sitting)   Pulse 79   Ht 5\' 11"  (1.803 m)   Wt 200 lb (90.7 kg)   BMI 27.89 kg/m   Physical Exam  Constitutional: He is oriented to person, place, and time. He appears well-developed and well-nourished.  HENT:  Head: Normocephalic and atraumatic.  Eyes: EOM are normal. Pupils are equal, round, and reactive to light.  Neck: Normal range of motion. Neck supple.  Pulmonary/Chest: Effort normal and breath sounds normal.  Abdominal: Soft. Bowel sounds are normal.  Neurological: He is alert and oriented to person, place, and time.  Skin: Skin is warm and dry.  Psychiatric: He has a normal mood and affect. His behavior is normal. Judgment and thought content normal.    Back Exam   Tenderness  The patient is experiencing tenderness in the lumbar.  Range of Motion  Extension: abnormal  Flexion: abnormal  Lateral Bend Right: abnormal  Lateral Bend Left: abnormal  Rotation Right: abnormal  Rotation Left: abnormal   Muscle Strength  Right Quadriceps:  5/5  Left Quadriceps:  5/5  Right Hamstrings:  5/5  Left Hamstrings:  5/5   Tests  Straight leg raise right: negative Straight leg raise left: negative  Reflexes  Babinski's sign: normal   Other  Toe Walk: abnormal Heel Walk: abnormal Gait: abnormal   Comments:  Weak right foot DF 4/5, right 5/5  numbness left L5 and S1.       Specialty Comments:  No specialty comments available.  Imaging: No results found.   PMFS History: Patient Active Problem List   Diagnosis Date Noted  . Esophageal reflux 02/10/2015  . Medicare annual wellness visit, subsequent 02/10/2015  . Personal history of colonic polyps 10/27/2012  . 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  . BPH (benign prostatic hyperplasia) 04/23/2012  . ED (erectile dysfunction) 04/23/2012  . Allergic state 10/25/2011  . Anxiety and depression 10/25/2011  . Asthma   . Hyperglycemia   . Hyperlipidemia   . Overweight   . Insomnia   .  Fatigue   . Preventative health care   . DDD (degenerative disc disease), lumbosacral   . HTN (hypertension)    Past Medical History:  Diagnosis Date  . Acute pharyngitis 10/21/2013  . Allergy    grass and pollen  . Anxiety and depression 10/25/2011  . Asthma    exercise induced  . BPH (benign prostatic hyperplasia) 04/23/2012  . Chicken pox as a child  . DDD (degenerative disc disease)    cervical responds to steroid injections and low back required surgery  . DDD (degenerative disc disease), lumbosacral   . Diabetes mellitus    pre  . ED (erectile dysfunction) 04/23/2012  . Elevated BP   . Esophageal reflux 02/10/2015  . Fatigue   . HTN (hypertension)   . Hyperglycemia    preDM   . Hyperlipidemia   . Insomnia   . Measles as a child  . Medicare annual wellness visit, subsequent 02/10/2015  . Overweight(278.02)   . Personal history of colonic polyps 10/27/2012   Follows with [facility]  . Preventative health care   . 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    Family History  Problem Relation Age of Onset  . Hypertension Mother   . Diabetes Mother        type 2  . Cancer Mother 62       breast in remission  . Emphysema Father        smoker  . COPD Father        smoker  . Stroke Father 48       mini  . Heart disease Father   . Hypertension Sister   . Hyperlipidemia Sister   . Scoliosis Sister   . Osteoporosis Sister   . Hypertension Maternal Grandmother   . Scoliosis Maternal Grandmother   . Heart disease Maternal Grandfather   . Heart disease Paternal Grandfather        smoker  . Heart disease Daughter     Past Surgical History:  Procedure Laterality Date  . BACK SURGERY  2012 and 1994   Dr Margret Chance, screws and cage in low back  . TONSILLECTOMY    . torn rotator cuff  2010   right   Social History   Occupational History  . Not on file.   Social History Main Topics  . Smoking status: Former Smoker    Packs/day: 1.00    Years: 20.00    Types:  Cigarettes    Quit date: 07/18/1989  . Smokeless tobacco: Never Used     Comment: off and on smoking for the 20 yrs  . Alcohol use Yes     Comment: drink or two a day- mixed drink  . Drug use: No  . Sexual activity: Yes     Comment: lives with wife, still working, no dietary restrictions, continues to exercise intermittently

## 2016-12-01 NOTE — Patient Instructions (Signed)
Avoid frequent bending and stooping  No lifting greater than 10 lbs. May use ice or moist heat for pain. Weight loss is of benefit. MRi of the lumbar spine with and without contrast ordered.

## 2016-12-06 ENCOUNTER — Ambulatory Visit (INDEPENDENT_AMBULATORY_CARE_PROVIDER_SITE_OTHER): Payer: Medicare Other | Admitting: Behavioral Health

## 2016-12-06 DIAGNOSIS — E349 Endocrine disorder, unspecified: Secondary | ICD-10-CM

## 2016-12-06 MED ORDER — TESTOSTERONE CYPIONATE 200 MG/ML IM SOLN
200.0000 mg | Freq: Once | INTRAMUSCULAR | Status: AC
  Administered 2016-12-06: 200 mg via INTRAMUSCULAR

## 2016-12-06 NOTE — Progress Notes (Addendum)
Pre visit review using our clinic review tool, if applicable. No additional management support is needed unless otherwise documented below in the visit note.  Patient came in clinic today for testosterone injection. He tolerated the injection well. Next appointment scheduled for 12/16/16 at 10:00 AM.  RN note reviewed. Agree with documention and plan.  Penni Homans, MD

## 2016-12-16 ENCOUNTER — Ambulatory Visit (INDEPENDENT_AMBULATORY_CARE_PROVIDER_SITE_OTHER): Payer: Medicare Other

## 2016-12-16 DIAGNOSIS — E349 Endocrine disorder, unspecified: Secondary | ICD-10-CM | POA: Diagnosis not present

## 2016-12-16 MED ORDER — TESTOSTERONE CYPIONATE 100 MG/ML IM SOLN
200.0000 mg | Freq: Once | INTRAMUSCULAR | Status: AC
  Administered 2016-12-16: 200 mg via INTRAMUSCULAR

## 2016-12-17 ENCOUNTER — Other Ambulatory Visit: Payer: Self-pay | Admitting: Family Medicine

## 2016-12-26 ENCOUNTER — Other Ambulatory Visit (INDEPENDENT_AMBULATORY_CARE_PROVIDER_SITE_OTHER): Payer: Self-pay | Admitting: Specialist

## 2016-12-26 ENCOUNTER — Other Ambulatory Visit: Payer: Self-pay | Admitting: Family Medicine

## 2016-12-26 DIAGNOSIS — L821 Other seborrheic keratosis: Secondary | ICD-10-CM | POA: Diagnosis not present

## 2016-12-26 DIAGNOSIS — D239 Other benign neoplasm of skin, unspecified: Secondary | ICD-10-CM | POA: Diagnosis not present

## 2016-12-26 DIAGNOSIS — L814 Other melanin hyperpigmentation: Secondary | ICD-10-CM | POA: Diagnosis not present

## 2016-12-26 DIAGNOSIS — L309 Dermatitis, unspecified: Secondary | ICD-10-CM | POA: Diagnosis not present

## 2016-12-26 DIAGNOSIS — L570 Actinic keratosis: Secondary | ICD-10-CM | POA: Diagnosis not present

## 2016-12-26 NOTE — Telephone Encounter (Signed)
Tramadol refill Request 

## 2016-12-27 DIAGNOSIS — H43813 Vitreous degeneration, bilateral: Secondary | ICD-10-CM | POA: Diagnosis not present

## 2016-12-27 DIAGNOSIS — Z961 Presence of intraocular lens: Secondary | ICD-10-CM | POA: Diagnosis not present

## 2016-12-27 NOTE — Telephone Encounter (Signed)
Called to [facility] [address]

## 2016-12-28 ENCOUNTER — Ambulatory Visit
Admission: RE | Admit: 2016-12-28 | Discharge: 2016-12-28 | Disposition: A | Discharge status: Home / Self Care | Payer: Medicare Other | Source: Ambulatory Visit | Attending: Specialist | Admitting: Specialist

## 2016-12-28 DIAGNOSIS — M48061 Spinal stenosis, lumbar region without neurogenic claudication: Secondary | ICD-10-CM | POA: Diagnosis not present

## 2016-12-28 DIAGNOSIS — T8484XA Pain due to internal orthopedic prosthetic devices, implants and grafts, initial encounter: Secondary | ICD-10-CM

## 2016-12-28 DIAGNOSIS — M960 Pseudarthrosis after fusion or arthrodesis: Secondary | ICD-10-CM

## 2016-12-28 DIAGNOSIS — M533 Sacrococcygeal disorders, not elsewhere classified: Secondary | ICD-10-CM

## 2016-12-28 DIAGNOSIS — M5417 Radiculopathy, lumbosacral region: Secondary | ICD-10-CM

## 2016-12-28 MED ORDER — GADOBENATE DIMEGLUMINE 529 MG/ML IV SOLN: 18.0000 mL | Freq: Once | INTRAVENOUS | Status: DC | PRN

## 2016-12-30 ENCOUNTER — Ambulatory Visit (INDEPENDENT_AMBULATORY_CARE_PROVIDER_SITE_OTHER): Payer: Medicare Other

## 2016-12-30 DIAGNOSIS — E349 Endocrine disorder, unspecified: Secondary | ICD-10-CM | POA: Diagnosis not present

## 2016-12-30 MED ORDER — TESTOSTERONE CYPIONATE 100 MG/ML IM SOLN
200.0000 mg | Freq: Once | INTRAMUSCULAR | Status: AC
  Administered 2016-12-30: 200 mg via INTRAMUSCULAR

## 2016-12-30 NOTE — Progress Notes (Signed)
Pre visit review using our clinic tool,if applicable. No additional management support is needed unless otherwise documented below in the visit note.   Patient in for Testosterone injection per order from Dr. Frederik Pear due to  Testosterone deficiency.  Given 200 mg IM Right ventrogluteal muscle.   Patient tolerated well. Return appointment given for 2 weeks.

## 2016-12-30 NOTE — Progress Notes (Signed)
Note reviewed.

## 2017-01-12 ENCOUNTER — Other Ambulatory Visit: Payer: Self-pay | Admitting: Family Medicine

## 2017-01-13 ENCOUNTER — Ambulatory Visit (INDEPENDENT_AMBULATORY_CARE_PROVIDER_SITE_OTHER): Payer: Medicare Other

## 2017-01-13 DIAGNOSIS — E349 Endocrine disorder, unspecified: Secondary | ICD-10-CM

## 2017-01-13 MED ORDER — TESTOSTERONE CYPIONATE 100 MG/ML IM SOLN
200.0000 mg | Freq: Once | INTRAMUSCULAR | Status: AC
  Administered 2017-01-13: 200 mg via INTRAMUSCULAR

## 2017-01-16 ENCOUNTER — Ambulatory Visit (INDEPENDENT_AMBULATORY_CARE_PROVIDER_SITE_OTHER): Payer: Medicare Other | Admitting: Family Medicine

## 2017-01-16 ENCOUNTER — Encounter: Payer: Self-pay | Admitting: Family Medicine

## 2017-01-16 VITALS — BP 130/68 | HR 64 | Temp 98.5°F | Resp 18 | Ht 71.0 in | Wt 191.6 lb

## 2017-01-16 DIAGNOSIS — G4700 Insomnia, unspecified: Secondary | ICD-10-CM

## 2017-01-16 DIAGNOSIS — M545 Low back pain, unspecified: Secondary | ICD-10-CM

## 2017-01-16 DIAGNOSIS — Z0000 Encounter for general adult medical examination without abnormal findings: Secondary | ICD-10-CM

## 2017-01-16 DIAGNOSIS — R739 Hyperglycemia, unspecified: Secondary | ICD-10-CM | POA: Diagnosis not present

## 2017-01-16 DIAGNOSIS — E349 Endocrine disorder, unspecified: Secondary | ICD-10-CM

## 2017-01-16 DIAGNOSIS — E663 Overweight: Secondary | ICD-10-CM

## 2017-01-16 DIAGNOSIS — E782 Mixed hyperlipidemia: Secondary | ICD-10-CM

## 2017-01-16 DIAGNOSIS — I10 Essential (primary) hypertension: Secondary | ICD-10-CM

## 2017-01-16 DIAGNOSIS — E785 Hyperlipidemia, unspecified: Secondary | ICD-10-CM | POA: Diagnosis not present

## 2017-01-16 DIAGNOSIS — M79605 Pain in left leg: Secondary | ICD-10-CM

## 2017-01-16 DIAGNOSIS — M79604 Pain in right leg: Secondary | ICD-10-CM | POA: Insufficient documentation

## 2017-01-16 HISTORY — DX: Pain in left leg: M79.605

## 2017-01-16 HISTORY — DX: Low back pain, unspecified: M54.50

## 2017-01-16 HISTORY — DX: Pain in right leg: M79.604

## 2017-01-16 LAB — COMPREHENSIVE METABOLIC PANEL
ALT: 13 U/L (ref 0–53)
AST: 25 U/L (ref 0–37)
Albumin: 4.2 g/dL (ref 3.5–5.2)
Alkaline Phosphatase: 50 U/L (ref 39–117)
BUN: 20 mg/dL (ref 6–23)
CO2: 31 mEq/L (ref 19–32)
Calcium: 9.6 mg/dL (ref 8.4–10.5)
Chloride: 102 mEq/L (ref 96–112)
Creatinine, Ser: 0.89 mg/dL (ref 0.40–1.50)
GFR: 88.96 mL/min (ref >60.00)
Glucose, Bld: 133 mg/dL — ABNORMAL HIGH (ref 70–99)
Potassium: 4.9 mEq/L (ref 3.5–5.1)
Sodium: 138 mEq/L (ref 135–145)
Total Bilirubin: 1 mg/dL (ref 0.2–1.2)
Total Protein: 6.4 g/dL (ref 6.0–8.3)

## 2017-01-16 LAB — TESTOSTERONE: Testosterone: 669.61 ng/dL (ref 300.00–890.00)

## 2017-01-16 LAB — CBC
HCT: 47.5 % (ref 39.0–52.0)
Hemoglobin: 16.3 g/dL (ref 13.0–17.0)
MCHC: 34.3 g/dL (ref 30.0–36.0)
MCV: 101.8 fl — ABNORMAL HIGH (ref 78.0–100.0)
Platelets: 299 10*3/uL (ref 150.0–400.0)
RBC: 4.66 Mil/uL (ref 4.22–5.81)
RDW: 14.2 % (ref 11.5–15.5)
WBC: 7.1 10*3/uL (ref 4.0–10.5)

## 2017-01-16 LAB — LIPID PANEL
Cholesterol: 142 mg/dL (ref 0–200)
HDL: 75.7 mg/dL (ref >39.00)
LDL Cholesterol: 55 mg/dL (ref 0–99)
NonHDL: 66.63
Total CHOL/HDL Ratio: 2
Triglycerides: 58 mg/dL (ref 0.0–149.0)
VLDL: 11.6 mg/dL (ref 0.0–40.0)

## 2017-01-16 LAB — PSA: PSA: 0.32 ng/mL (ref 0.10–4.00)

## 2017-01-16 LAB — HEMOGLOBIN A1C: Hgb A1c MFr Bld: 6.1 % (ref 4.6–6.5)

## 2017-01-16 LAB — TSH: TSH: 3.75 u[IU]/mL (ref 0.35–4.50)

## 2017-01-16 MED ORDER — DIAZEPAM 10 MG PO TABS: ORAL_TABLET | ORAL | 2 refills | Status: DC

## 2017-01-16 NOTE — Assessment & Plan Note (Signed)
Patient encouraged to maintain heart healthy diet, regular exercise, adequate sleep. Consider daily probiotics. Take medications as prescribed. Labs ordered today 

## 2017-01-16 NOTE — Assessment & Plan Note (Signed)
>>  ASSESSMENT AND PLAN FOR TESTOSTERONE DEFICIENCY WRITTEN ON 01/16/2017 10:21 AM BY BLYTH, STACEY A, MD  Check testosterone level

## 2017-01-16 NOTE — Progress Notes (Signed)
Subjective:  I acted as a Education administrator for Dr. [name]. [place], [place]  [name], male    DOB: Aug 31, 1945, 71 y.o.   MRN: [id number]  Chief Complaint  Patient presents with  . Annual Exam    HPI  Patient is in today for an annual exam With a past medical history significant for hypertension, hyperlipidemia, hyperglycemia and more. Overall he feels well. He is visiting a great deal with his grandchildren this summer and is pleased about that. Reports he does well with activities of daily living. Tries to maintain a heart healthy diet and stay active. No recent febrile illness or acute concerns. Denies CP/palp/SOB/HA/congestion/fevers/GI or GU c/o. Taking meds as prescribed  Patient Care Team: [name], MD as PCP - General (Family Medicine) Keene Breath., MD (Ophthalmology) Jessy Oto, MD as Consulting Physician (Orthopedic Surgery)   Past Medical History:  Diagnosis Date  . Acute pharyngitis 10/21/2013  . Allergy    grass and pollen  . Anxiety and depression 10/25/2011  . Asthma    exercise induced  . BPH (benign prostatic hyperplasia) 04/23/2012  . Chicken pox as a child  . DDD (degenerative disc disease)    cervical responds to steroid injections and low back required surgery  . DDD (degenerative disc disease), lumbosacral   . Diabetes mellitus    pre  . ED (erectile dysfunction) 04/23/2012  . Elevated BP   . Esophageal reflux 02/10/2015  . Fatigue   . HTN (hypertension)   . Hyperglycemia    preDM   . Hyperlipidemia   . Insomnia   . Low back pain radiating to both legs 01/16/2017  . Measles as a child  . Medicare annual wellness visit, subsequent 02/10/2015  . Overweight(278.02)   . Personal history of colonic polyps 10/27/2012   Follows with [facility]  . Preventative health care   . 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    Past Surgical History:  Procedure Laterality Date  . BACK SURGERY  2012 and 1994   Dr Margret Chance, screws and cage in low  back  . TONSILLECTOMY    . torn rotator cuff  2010   right    Family History  Problem Relation Age of Onset  . Hypertension Mother   . Diabetes Mother        type 2  . Cancer Mother 93       breast in remission  . Emphysema Father        smoker  . COPD Father        smoker  . Stroke Father 33       mini  . Heart disease Father   . Hypertension Sister   . Hyperlipidemia Sister   . Scoliosis Sister   . Osteoporosis Sister   . Hypertension Maternal Grandmother   . Scoliosis Maternal Grandmother   . Heart disease Maternal Grandfather   . Heart disease Paternal Grandfather        smoker  . Heart disease Daughter     Social History   Social History  . Marital status: Married    Spouse name: N/A  . Number of children: N/A  . Years of education: N/A   Occupational History  . Not on file.   Social History Main Topics  . Smoking status: Former Smoker    Packs/day: 1.00    Years: 20.00    Types: Cigarettes    Quit date: 07/18/1989  . Smokeless tobacco: Never Used  Comment: off and on smoking for the 20 yrs  . Alcohol use Yes     Comment: drink or two a day- mixed drink  . Drug use: No  . Sexual activity: Yes     Comment: lives with wife, still working, no dietary restrictions, continues to exercise intermittently   Other Topics Concern  . Not on file   Social History Narrative  . No narrative on file    Outpatient Medications Prior to Visit  Medication Sig Dispense Refill  . acyclovir (ZOVIRAX) 200 MG capsule TAKE ONE CAPSULE BY MOUTH 5 TIMES A DAY 25 capsule 1  . aspirin 81 MG tablet Take 81 mg by mouth daily.    Marland Kitchen atorvastatin (LIPITOR) 20 MG tablet TAKE 1/2 TABLET BY MOUTH DAILY AT 6 PM 45 tablet 0  . b complex vitamins tablet Take 1 tablet by mouth daily.    . celecoxib (CELEBREX) 200 MG capsule Take 1 capsule (200 mg total) by mouth daily. 90 capsule 0  . ECHINACEA PLUS CAPS Take 1 capsule by mouth as needed. 750 mg    . glucose blood (ONE TOUCH  ULTRA TEST) test strip Check daily 32 each 1  . lisinopril (PRINIVIL,ZESTRIL) 5 MG tablet TAKE 1 TABLET BY MOUTH EVERY DAY 90 tablet 1  . metFORMIN (GLUCOPHAGE) 500 MG tablet Take 1 tablet (500 mg total) by mouth daily. 90 tablet 1  . methocarbamol (ROBAXIN) 500 MG tablet TAKE 1 TABLET BY MOUTH EVERY 8 HOURS AS NEEDED FOR MUSCLE SPASM 50 tablet 0  . Multiple Vitamin (MULTIVITAMIN) tablet Take 1 tablet by mouth daily.    Marland Kitchen PROAIR HFA 108 (90 Base) MCG/ACT inhaler TAKE 1-2 PUFFS INHALE THREE TIMES A DAY 8.5 Inhaler 0  . ranitidine (ZANTAC) 300 MG tablet Take 1 tablet (300 mg total) by mouth daily. 90 tablet 3  . sildenafil (REVATIO) 20 MG tablet Take 1 tablet (20 mg total) by mouth 3 (three) times daily as needed. 50 tablet 3  . tamsulosin (FLOMAX) 0.4 MG CAPS capsule Take 2 capsules (0.8 mg total) by mouth daily. 180 capsule 1  . traMADol (ULTRAM) 50 MG tablet TAKE 1/2-1 TABLET BY MOUTH 4 TIMES A DAY AS NEEDED FOR PAIN 120 tablet 0  . venlafaxine XR (EFFEXOR-XR) 75 MG 24 hr capsule Take 1 capsule (75 mg total) by mouth daily with breakfast. 90 capsule 0  . diazepam (VALIUM) 10 MG tablet TAKE 1 TABLET BY MOUTH EVERY DAY AT BEDTIME AS NEEDED 30 tablet 1  . cetirizine (ZYRTEC) 10 MG tablet Take 1 tablet (10 mg total) by mouth daily as needed for allergies or rhinitis. 30 tablet 11  . fluticasone (FLONASE) 50 MCG/ACT nasal spray Place 2 sprays into the nose daily. 48 g 1  . testosterone cypionate (DEPOTESTOSTERONE CYPIONATE) 200 MG/ML injection Inject 1 mL (200 mg total) into the muscle once. 10 mL 0   No facility-administered medications prior to visit.     No Known Allergies  Review of Systems  Constitutional: Negative for fever and malaise/fatigue.  HENT: Negative for congestion.   Eyes: Negative for blurred vision.  Respiratory: Negative for cough and shortness of breath.   Cardiovascular: Negative for chest pain, palpitations and leg swelling.  Gastrointestinal: Negative for vomiting.    Musculoskeletal: Negative for back pain.  Skin: Negative for rash.  Neurological: Negative for loss of consciousness and headaches.       Objective:    Physical Exam  Constitutional: He is oriented to person, place, and time.  He appears well-developed and well-nourished. No distress.  HENT:  Head: Normocephalic and atraumatic.  Eyes: Conjunctivae are normal.  Neck: Normal range of motion. No thyromegaly present.  Cardiovascular: Normal rate and regular rhythm.   Pulmonary/Chest: Effort normal and breath sounds normal. He has no wheezes.  Abdominal: Soft. Bowel sounds are normal. There is no tenderness.  Musculoskeletal: Normal range of motion. He exhibits no edema or deformity.  Neurological: He is alert and oriented to person, place, and time.  Skin: Skin is warm and dry. He is not diaphoretic.  Psychiatric: He has a normal mood and affect.    BP 130/68 (BP Location: Left Arm, Patient Position: Sitting, Cuff Size: Normal)   Pulse 64   Temp 98.5 F (36.9 C) (Oral)   Resp 18   Ht 5\' 11"  (1.803 m)   Wt 191 lb 9.6 oz (86.9 kg)   SpO2 99%   BMI 26.72 kg/m  Wt Readings from Last 3 Encounters:  01/16/17 191 lb 9.6 oz (86.9 kg)  12/01/16 200 lb (90.7 kg)  10/13/16 200 lb (90.7 kg)   BP Readings from Last 3 Encounters:  01/16/17 130/68  12/01/16 120/66  10/26/16 (!) 148/80     Immunization History  Administered Date(s) Administered  . Influenza Split 04/23/2012  . Influenza Whole 02/16/2011  . Influenza, High Dose Seasonal PF 05/06/2013, 03/25/2016  . Pneumococcal Conjugate-13 03/25/2016  . Pneumococcal Polysaccharide-23 10/19/2011  . Td 07/18/2008  . Zoster 04/27/2016    Health Maintenance  Topic Date Due  . INFLUENZA VACCINE  02/15/2017  . OPHTHALMOLOGY EXAM  04/17/2017  . HEMOGLOBIN A1C  07/19/2017  . FOOT EXAM  08/23/2017  . TETANUS/TDAP  07/18/2018  . COLONOSCOPY  09/05/2022  . Hepatitis C Screening  Completed  . PNA vac Low Risk Adult  Completed     Lab Results  Component Value Date   WBC 7.1 01/16/2017   HGB 16.3 01/16/2017   HCT 47.5 01/16/2017   PLT 299.0 01/16/2017   GLUCOSE 133 (H) 01/16/2017   CHOL 142 01/16/2017   TRIG 58.0 01/16/2017   HDL 75.70 01/16/2017   LDLCALC 55 01/16/2017   ALT 13 01/16/2017   AST 25 01/16/2017   NA 138 01/16/2017   K 4.9 01/16/2017   CL 102 01/16/2017   CREATININE 0.89 01/16/2017   BUN 20 01/16/2017   CO2 31 01/16/2017   TSH 3.75 01/16/2017   PSA 0.32 01/16/2017   INR 0.91 04/08/2011   HGBA1C 6.1 01/16/2017   MICROALBUR 0.61 04/23/2012    Lab Results  Component Value Date   TSH 3.75 01/16/2017   Lab Results  Component Value Date   WBC 7.1 01/16/2017   HGB 16.3 01/16/2017   HCT 47.5 01/16/2017   MCV 101.8 (H) 01/16/2017   PLT 299.0 01/16/2017   Lab Results  Component Value Date   NA 138 01/16/2017   K 4.9 01/16/2017   CO2 31 01/16/2017   GLUCOSE 133 (H) 01/16/2017   BUN 20 01/16/2017   CREATININE 0.89 01/16/2017   BILITOT 1.0 01/16/2017   ALKPHOS 50 01/16/2017   AST 25 01/16/2017   ALT 13 01/16/2017   PROT 6.4 01/16/2017   ALBUMIN 4.2 01/16/2017   CALCIUM 9.6 01/16/2017   GFR 88.96 01/16/2017   Lab Results  Component Value Date   CHOL 142 01/16/2017   Lab Results  Component Value Date   HDL 75.70 01/16/2017   Lab Results  Component Value Date   LDLCALC 55 01/16/2017   Lab  Results  Component Value Date   TRIG 58.0 01/16/2017   Lab Results  Component Value Date   CHOLHDL 2 01/16/2017   Lab Results  Component Value Date   HGBA1C 6.1 01/16/2017         Assessment & Plan:   Problem List Items Addressed This Visit    HTN (hypertension)    Well controlled, no changes to meds. Encouraged heart healthy diet such as the DASH diet and exercise as tolerated.       Relevant Orders   CBC (Completed)   Comprehensive metabolic panel (Completed)   TSH (Completed)   Hyperglycemia    hgba1c acceptable, minimize simple carbs. Increase exercise as  tolerated.       Relevant Orders   Hemoglobin A1c (Completed)   Hyperlipidemia    Encouraged heart healthy diet, increase exercise, avoid trans fats, consider a krill oil cap daily      Relevant Orders   Lipid panel (Completed)   Overweight    Encouraged DASH diet, decrease po intake and increase exercise as tolerated. Needs 7-8 hours of sleep nightly. Avoid trans fats, eat small, frequent meals every 4-5 hours with lean proteins, complex carbs and healthy fats. Minimize simple carbs      Insomnia   Preventative health care    Patient encouraged to maintain heart healthy diet, regular exercise, adequate sleep. Consider daily probiotics. Take medications as prescribed. Labs ordered today      Testosterone deficiency - Primary    Check testosterone level      Relevant Orders   PSA (Completed)   Testosterone (Completed)   Low back pain radiating to both legs    Follows with Dr Louanne Skye with a history of rods         I am having Mr. Cowsert maintain his aspirin, ECHINACEA PLUS, b complex vitamins, multivitamin, cetirizine, fluticasone, glucose blood, lisinopril, ranitidine, PROAIR HFA, sildenafil, celecoxib, metFORMIN, venlafaxine XR, testosterone cypionate, tamsulosin, methocarbamol, atorvastatin, acyclovir, traMADol, and diazepam.  Meds ordered this encounter  Medications  . diazepam (VALIUM) 10 MG tablet    Sig: TAKE 1 TABLET BY MOUTH EVERY DAY AT BEDTIME AS NEEDED    Dispense:  30 tablet    Refill:  2    Not to exceed 4 additional fills before 04/29/2017    CMA served as scribe during this visit. History, Physical and Plan performed by medical provider. Documentation and orders reviewed and attested to.  Penni Homans, MD

## 2017-01-16 NOTE — Assessment & Plan Note (Signed)
Follows with Dr Louanne Skye with a history of rods

## 2017-01-16 NOTE — Assessment & Plan Note (Deleted)
Follows with Dr Louanne Skye with a history of rods

## 2017-01-16 NOTE — Patient Instructions (Signed)
shingrix is the new shingles shots. 2 shots over 6 months, cheapest at the pharmacy, at [facility] or [facility] and have them send [place] a copy   Preventive Care 73 Years and Older, Male Preventive care refers to lifestyle choices and visits with your health care provider that can promote health and wellness. What does preventive care include?  A yearly physical exam. This is also called an annual well check.  Dental exams once or twice a year.  Routine eye exams. Ask your health care provider how often you should have your eyes checked.  Personal lifestyle choices, including: ? Daily care of your teeth and gums. ? Regular physical activity. ? Eating a healthy diet. ? Avoiding tobacco and drug use. ? Limiting alcohol use. ? Practicing safe sex. ? Taking low doses of aspirin every day. ? Taking vitamin and mineral supplements as recommended by your health care provider. What happens during an annual well check? The services and screenings done by your health care provider during your annual well check will depend on your age, overall health, lifestyle risk factors, and family history of disease. Counseling Your health care provider may ask you questions about your:  Alcohol use.  Tobacco use.  Drug use.  Emotional well-being.  Home and relationship well-being.  Sexual activity.  Eating habits.  History of falls.  Memory and ability to understand (cognition).  Work and work Statistician.  Screening You may have the following tests or measurements:  Height, weight, and BMI.  Blood pressure.  Lipid and cholesterol levels. These may be checked every 5 years, or more frequently if you are over 15 years old.  Skin check.  Lung cancer screening. You may have this screening every year starting at age 73 if you have a 30-pack-year history of smoking and currently smoke or have quit within the past 15 years.  Fecal occult blood test (FOBT) of the stool. You may have this test  every year starting at age 73.  Flexible sigmoidoscopy or colonoscopy. You may have a sigmoidoscopy every 5 years or a colonoscopy every 10 years starting at age 73.  Prostate cancer screening. Recommendations will vary depending on your family history and other risks.  Hepatitis C blood test.  Hepatitis B blood test.  Sexually transmitted disease (STD) testing.  Diabetes screening. This is done by checking your blood sugar (glucose) after you have not eaten for a while (fasting). You may have this done every 1-3 years.  Abdominal aortic aneurysm (AAA) screening. You may need this if you are a current or former smoker.  Osteoporosis. You may be screened starting at age 73 if you are at high risk.  Talk with your health care provider about your test results, treatment options, and if necessary, the need for more tests. Vaccines Your health care provider may recommend certain vaccines, such as:  Influenza vaccine. This is recommended every year.  Tetanus, diphtheria, and acellular pertussis (Tdap, Td) vaccine. You may need a Td booster every 10 years.  Varicella vaccine. You may need this if you have not been vaccinated.  Zoster vaccine. You may need this after age 73.  Measles, mumps, and rubella (MMR) vaccine. You may need at least one dose of MMR if you were born in 1957 or later. You may also need a second dose.  Pneumococcal 13-valent conjugate (PCV13) vaccine. One dose is recommended after age 73.  Pneumococcal polysaccharide (PPSV23) vaccine. One dose is recommended after age 73.  Meningococcal vaccine. You may need this  if you have certain conditions.  Hepatitis A vaccine. You may need this if you have certain conditions or if you travel or work in places where you may be exposed to hepatitis A.  Hepatitis B vaccine. You may need this if you have certain conditions or if you travel or work in places where you may be exposed to hepatitis B.  Haemophilus influenzae type  b (Hib) vaccine. You may need this if you have certain risk factors.  Talk to your health care provider about which screenings and vaccines you need and how often you need them. This information is not intended to replace advice given to you by your health care provider. Make sure you discuss any questions you have with your health care provider. Document Released: 07/31/2015 Document Revised: 03/23/2016 Document Reviewed: 05/05/2015 Elsevier Interactive Patient Education  2017 [employer].

## 2017-01-16 NOTE — Assessment & Plan Note (Signed)
Well controlled, no changes to meds. Encouraged heart healthy diet such as the DASH diet and exercise as tolerated.  °

## 2017-01-16 NOTE — Assessment & Plan Note (Signed)
Encouraged DASH diet, decrease po intake and increase exercise as tolerated. Needs 7-8 hours of sleep nightly. Avoid trans fats, eat small, frequent meals every 4-5 hours with lean proteins, complex carbs and healthy fats. Minimize simple carbs 

## 2017-01-16 NOTE — Assessment & Plan Note (Signed)
Check testosterone level.  

## 2017-01-16 NOTE — Assessment & Plan Note (Signed)
Encouraged heart healthy diet, increase exercise, avoid trans fats, consider a krill oil cap daily 

## 2017-01-16 NOTE — Assessment & Plan Note (Signed)
hgba1c acceptable, minimize simple carbs. Increase exercise as tolerated.  

## 2017-01-19 ENCOUNTER — Ambulatory Visit (INDEPENDENT_AMBULATORY_CARE_PROVIDER_SITE_OTHER): Payer: Medicare Other | Admitting: Specialist

## 2017-01-19 ENCOUNTER — Encounter (INDEPENDENT_AMBULATORY_CARE_PROVIDER_SITE_OTHER): Payer: Self-pay | Admitting: Specialist

## 2017-01-19 VITALS — BP 148/71 | HR 57 | Ht 71.0 in | Wt 200.0 lb

## 2017-01-19 DIAGNOSIS — G9600 Cerebrospinal fluid leak, unspecified: Secondary | ICD-10-CM

## 2017-01-19 DIAGNOSIS — G9782 Other postprocedural complications and disorders of nervous system: Secondary | ICD-10-CM | POA: Diagnosis not present

## 2017-01-19 DIAGNOSIS — M533 Sacrococcygeal disorders, not elsewhere classified: Secondary | ICD-10-CM | POA: Diagnosis not present

## 2017-01-19 DIAGNOSIS — T84498A Other mechanical complication of other internal orthopedic devices, implants and grafts, initial encounter: Secondary | ICD-10-CM | POA: Diagnosis not present

## 2017-01-19 DIAGNOSIS — M5416 Radiculopathy, lumbar region: Secondary | ICD-10-CM | POA: Diagnosis not present

## 2017-01-19 NOTE — Patient Instructions (Addendum)
Plan: Avoid bending, stooping and avoid lifting weights greater than 10 lbs. Avoid prolong standing and walking. Avoid frequent bending and stooping  No lifting greater than 10 lbs. May use ice or moist heat for pain.  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 We will schedule you for Aspiration of the fluid at the Upper lumbar level and testing for CSF.  Plan to schedule for removal of the left SI screw and rearthrodesis of the L5-S1 level with redo posterior fusion and left L5 nerve decompression and reinstrument the lowest level.

## 2017-01-19 NOTE — Progress Notes (Deleted)
Post-Op Visit Note   Patient: Luis Brown           Date of Birth: Aug 27, 1945           MRN: [id number] Visit Date: 01/19/2017 PCP: [name], MD   Assessment & Plan:  Chief Complaint:  Chief Complaint  Patient presents with  . Lower Back - Follow-up    MRI Review   Visit Diagnoses: No diagnosis found.  Plan: ***  Follow-Up Instructions: No Follow-up on file.   Orders:  No orders of the defined types were placed in this encounter.  No orders of the defined types were placed in this encounter.   Imaging: No results found.  PMFS History: Patient Active Problem List   Diagnosis Date Noted  . Low back pain radiating to both legs 01/16/2017  . Esophageal reflux 02/10/2015  . Medicare annual wellness visit, subsequent 02/10/2015  . Personal history of colonic polyps 10/27/2012  . 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  . BPH (benign prostatic hyperplasia) 04/23/2012  . ED (erectile dysfunction) 04/23/2012  . Allergic state 10/25/2011  . Anxiety and depression 10/25/2011  . Asthma   . Hyperglycemia   . Hyperlipidemia   . Overweight   . Insomnia   . Fatigue   . Preventative health care   . DDD (degenerative disc disease), lumbosacral   . HTN (hypertension)    Past Medical History:  Diagnosis Date  . Acute pharyngitis 10/21/2013  . Allergy    grass and pollen  . Anxiety and depression 10/25/2011  . Asthma    exercise induced  . BPH (benign prostatic hyperplasia) 04/23/2012  . Chicken pox as a child  . DDD (degenerative disc disease)    cervical responds to steroid injections and low back required surgery  . DDD (degenerative disc disease), lumbosacral   . Diabetes mellitus    pre  . ED (erectile dysfunction) 04/23/2012  . Elevated BP   . Esophageal reflux 02/10/2015  . Fatigue   . HTN (hypertension)   . Hyperglycemia    preDM   . Hyperlipidemia   . Insomnia   . Low back pain radiating to both legs 01/16/2017  . Measles as a child  . Medicare  annual wellness visit, subsequent 02/10/2015  . Overweight(278.02)   . Personal history of colonic polyps 10/27/2012   Follows with [facility]  . Preventative health care   . 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    Family History  Problem Relation Age of Onset  . Hypertension Mother   . Diabetes Mother        type 2  . Cancer Mother 63       breast in remission  . Emphysema Father        smoker  . COPD Father        smoker  . Stroke Father 24       mini  . Heart disease Father   . Hypertension Sister   . Hyperlipidemia Sister   . Scoliosis Sister   . Osteoporosis Sister   . Hypertension Maternal Grandmother   . Scoliosis Maternal Grandmother   . Heart disease Maternal Grandfather   . Heart disease Paternal Grandfather        smoker  . Heart disease Daughter     Past Surgical History:  Procedure Laterality Date  . BACK SURGERY  2012 and 1994   Dr Margret Chance, screws and cage in low back  . TONSILLECTOMY    . torn rotator cuff  2010  right   Social History   Occupational History  . Not on file.   Social History Main Topics  . Smoking status: Former Smoker    Packs/day: 1.00    Years: 20.00    Types: Cigarettes    Quit date: 07/18/1989  . Smokeless tobacco: Never Used     Comment: off and on smoking for the 20 yrs  . Alcohol use Yes     Comment: drink or two a day- mixed drink  . Drug use: No  . Sexual activity: Yes     Comment: lives with wife, still working, no dietary restrictions, continues to exercise intermittently

## 2017-01-19 NOTE — Progress Notes (Signed)
Office Visit Note   Patient: Luis Brown           Date of Birth: 31-Oct-1945           MRN: [id number] Visit Date: 01/19/2017              Requested by: [name], MD [address] [place], [place] [address] PCP: [name], MD   Assessment & Plan: Visit Diagnoses:  1. 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 Pain of left sacroiliac joint   3. Chronic left lumbar radiculopathy     Plan: Avoid bending, stooping and avoid lifting weights greater than 10 lbs. Avoid prolong standing and walking. Avoid frequent bending and stooping  No lifting greater than 10 lbs. May use ice or moist heat for pain. Weight loss is of benefit.  We will schedule you for Aspiration of the fluid at the Upper lumbar level and testing for CSF.  Plan to schedule for removal of the left SI screw and rearthrodesis of the L5-S1 level with redo posterior fusion and left L5 nerve decompression and reinstrument the lowest level.   Follow-Up Instructions: No Follow-up on file.   Orders:  No orders of the defined types were placed in this encounter.  No orders of the defined types were placed in this encounter.     Procedures: No procedures performed   Clinical Data: No additional findings.   Subjective: Chief Complaint  Patient presents with  . 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 He is experiencing left sided persistent numbness and weakness in the left leg. The pain is not as troublesome as the trouble he experiences with walking.     Review of Systems  Constitutional: Negative.   HENT: Negative.   Eyes: Negative.   Respiratory: Negative.   Cardiovascular: Negative.   Gastrointestinal: Negative.   Endocrine: Negative.   Genitourinary: Negative.   Musculoskeletal: Negative.   Skin: Negative.     Allergic/Immunologic: Negative.   Neurological: Negative.   Hematological: Negative.   Psychiatric/Behavioral: Negative.      Objective: Vital Signs: BP (!) 148/71 (BP Location: Left Arm, Patient Position: Sitting)   Pulse (!) 57   Ht 5\' 11"  (1.803 m)   Wt 200 lb (90.7 kg)   BMI 27.89 kg/m   Physical Exam  Constitutional: He is oriented to person, place, and time. He appears well-developed and well-nourished.  HENT:  Head: Normocephalic and atraumatic.  Eyes: EOM are normal. Pupils are equal, round, and reactive to light.  Neck: Normal range of motion. Neck supple.  Pulmonary/Chest: Effort normal and breath sounds normal.  Abdominal: Soft. Bowel sounds are normal.  Musculoskeletal: Normal range of motion.  Neurological: He is alert and oriented to person, place, and time.  Skin: Skin is warm and dry.  Psychiatric: He has a normal mood and affect. His behavior is normal. Judgment and thought content normal.    Back Exam   Tenderness  The patient is experiencing tenderness in the lumbar.  Muscle Strength  Right Quadriceps:  5/5  Left Quadriceps:  5/5  Right Hamstrings:  5/5  Left Hamstrings:  5/5   Tests  Straight leg raise right: negative Straight leg raise left: negative      Specialty Comments:  No specialty comments available.  Imaging: No results  found.   PMFS History: Patient Active Problem List   Diagnosis Date Noted  . Low back pain radiating to both legs 01/16/2017  . Esophageal reflux 02/10/2015  . Medicare annual wellness visit, subsequent 02/10/2015  . Personal history of colonic polyps 10/27/2012  . 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  . BPH (benign prostatic hyperplasia) 04/23/2012  . ED (erectile dysfunction) 04/23/2012  . Allergic state 10/25/2011  . Anxiety and depression 10/25/2011  . Asthma   . Hyperglycemia   . Hyperlipidemia   . Overweight   . Insomnia   . Fatigue   . Preventative health care   . DDD (degenerative disc  disease), lumbosacral   . HTN (hypertension)    Past Medical History:  Diagnosis Date  . Acute pharyngitis 10/21/2013  . Allergy    grass and pollen  . Anxiety and depression 10/25/2011  . Asthma    exercise induced  . BPH (benign prostatic hyperplasia) 04/23/2012  . Chicken pox as a child  . DDD (degenerative disc disease)    cervical responds to steroid injections and low back required surgery  . DDD (degenerative disc disease), lumbosacral   . Diabetes mellitus    pre  . ED (erectile dysfunction) 04/23/2012  . Elevated BP   . Esophageal reflux 02/10/2015  . Fatigue   . HTN (hypertension)   . Hyperglycemia    preDM   . Hyperlipidemia   . Insomnia   . Low back pain radiating to both legs 01/16/2017  . Measles as a child  . Medicare annual wellness visit, subsequent 02/10/2015  . Overweight(278.02)   . Personal history of colonic polyps 10/27/2012   Follows with [facility]  . Preventative health care   . 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    Family History  Problem Relation Age of Onset  . Hypertension Mother   . Diabetes Mother        type 2  . Cancer Mother 35       breast in remission  . Emphysema Father        smoker  . COPD Father        smoker  . Stroke Father 98       mini  . Heart disease Father   . Hypertension Sister   . Hyperlipidemia Sister   . Scoliosis Sister   . Osteoporosis Sister   . Hypertension Maternal Grandmother   . Scoliosis Maternal Grandmother   . Heart disease Maternal Grandfather   . Heart disease Paternal Grandfather        smoker  . Heart disease Daughter     Past Surgical History:  Procedure Laterality Date  . BACK SURGERY  2012 and 1994   Dr Margret Chance, screws and cage in low back  . TONSILLECTOMY    . torn rotator cuff  2010   right   Social History   Occupational History  . Not on file.   Social History Main Topics  . Smoking status: Former Smoker    Packs/day: 1.00    Years: 20.00    Types: Cigarettes    Quit  date: 07/18/1989  . Smokeless tobacco: Never Used     Comment: off and on smoking for the 20 yrs  . Alcohol use Yes     Comment: drink or two a day- mixed drink  . Drug use: No  . Sexual activity: Yes     Comment: lives with wife, still working, no dietary restrictions, continues to exercise intermittently

## 2017-01-20 ENCOUNTER — Other Ambulatory Visit (INDEPENDENT_AMBULATORY_CARE_PROVIDER_SITE_OTHER): Payer: Self-pay | Admitting: Specialist

## 2017-01-20 DIAGNOSIS — G9782 Other postprocedural complications and disorders of nervous system: Secondary | ICD-10-CM

## 2017-01-20 DIAGNOSIS — G9600 Cerebrospinal fluid leak, unspecified: Secondary | ICD-10-CM

## 2017-01-23 ENCOUNTER — Other Ambulatory Visit (INDEPENDENT_AMBULATORY_CARE_PROVIDER_SITE_OTHER): Payer: Self-pay | Admitting: Specialist

## 2017-01-23 DIAGNOSIS — G9600 Cerebrospinal fluid leak, unspecified: Secondary | ICD-10-CM

## 2017-01-23 DIAGNOSIS — G9782 Other postprocedural complications and disorders of nervous system: Secondary | ICD-10-CM

## 2017-01-27 ENCOUNTER — Ambulatory Visit (INDEPENDENT_AMBULATORY_CARE_PROVIDER_SITE_OTHER): Payer: Medicare Other

## 2017-01-27 DIAGNOSIS — E349 Endocrine disorder, unspecified: Secondary | ICD-10-CM | POA: Diagnosis not present

## 2017-01-27 MED ORDER — TESTOSTERONE CYPIONATE 100 MG/ML IM SOLN
200.0000 mg | Freq: Once | INTRAMUSCULAR | Status: AC
  Administered 2017-01-27: 200 mg via INTRAMUSCULAR

## 2017-01-27 NOTE — Progress Notes (Addendum)
Pre visit review using our clinic tool,if applicable. No additional management support is needed unless otherwise documented below in the visit note.   Patient in for Testoterone Injection per order from Dr. [name]. Patient has Testosterone deficiency.  Given 200 mg IM right hip. Patient tolerated well. Return appointment given to patient for 2 weeks.   Nurse note reviewed. Agree with documention and plan.

## 2017-02-01 ENCOUNTER — Telehealth (INDEPENDENT_AMBULATORY_CARE_PROVIDER_SITE_OTHER): Payer: Self-pay | Admitting: Radiology

## 2017-02-01 ENCOUNTER — Ambulatory Visit
Admission: RE | Admit: 2017-02-01 | Discharge: 2017-02-01 | Disposition: A | Discharge status: Home / Self Care | Payer: Medicare Other | Source: Ambulatory Visit | Attending: Specialist | Admitting: Specialist

## 2017-02-01 DIAGNOSIS — M545 Low back pain, unspecified: Secondary | ICD-10-CM

## 2017-02-01 DIAGNOSIS — G9782 Other postprocedural complications and disorders of nervous system: Secondary | ICD-10-CM

## 2017-02-01 DIAGNOSIS — G9600 Cerebrospinal fluid leak, unspecified: Secondary | ICD-10-CM

## 2017-02-01 DIAGNOSIS — M79604 Pain in right leg: Secondary | ICD-10-CM

## 2017-02-01 DIAGNOSIS — L02212 Cutaneous abscess of back [any part, except buttock]: Secondary | ICD-10-CM | POA: Diagnosis not present

## 2017-02-01 LAB — CELL COUNT AND DIFF, FLUID, OTHER
Basophils, %: 0 %
Eosinophils, %: 1 %
Lymphocytes, %: 85 %
Mesothelial, %: 0 %
Monocyte/Macrophage %: 6 %
Neutrophils, %: 8 %
Total Nucleated Cell Ct: 19 cells/uL

## 2017-02-01 LAB — PROTEIN, PERITONEAL FLUID: Protein, Peritoneal Fluid: 3.5 g/dL

## 2017-02-01 LAB — GLUCOSE, PERITONEAL FLUID: Glucose, Peritoneal Fluid: 102 mg/dL

## 2017-02-01 NOTE — Telephone Encounter (Signed)
Luis Brown from [facility] called  No white blood cells and no organisms on gram stain.

## 2017-02-01 NOTE — Telephone Encounter (Signed)
I called the lab number and spoke with [facility] representative. Requested that CSF testing be preformed on this patients aspiration sample.

## 2017-02-01 NOTE — Telephone Encounter (Signed)
Iona Beard from [facility] called in reference to lab orders.  States received 4 syringes and wanted confirmation on if CSF test needed to be done. Call back number [phone] opt. 2  Lab # [id number]

## 2017-02-01 NOTE — Telephone Encounter (Signed)
I called the preliminary gram stain report to Mr. Frayre and will call him when the remaining lab returns.

## 2017-02-01 NOTE — Telephone Encounter (Signed)
Iona Beard from [facility] called in reference to lab orders.  States received 4 syringes and wanted confirmation on if CSF test needed to be done. Call back number [phone] opt. 2  Lab # [id number]  ------Can you please advise on this message?-----

## 2017-02-01 NOTE — Telephone Encounter (Signed)
Luis Brown from [facility] called  No white blood cells and no organisms on gram stain.

## 2017-02-04 LAB — CULTURE, ROUTINE-ABSCESS
Gram Stain: NONE SEEN
Gram Stain: NONE SEEN
Gram Stain: NONE SEEN
Organism ID, Bacteria: NO GROWTH

## 2017-02-04 LAB — BETA 2 TRANSFERRIN: Beta-2 Transferrin, Fluid: NOT DETECTED

## 2017-02-10 ENCOUNTER — Ambulatory Visit (INDEPENDENT_AMBULATORY_CARE_PROVIDER_SITE_OTHER): Payer: Medicare Other

## 2017-02-10 DIAGNOSIS — R7989 Other specified abnormal findings of blood chemistry: Secondary | ICD-10-CM

## 2017-02-10 MED ORDER — TESTOSTERONE CYPIONATE 100 MG/ML IM SOLN
200.0000 mg | Freq: Once | INTRAMUSCULAR | Status: AC
  Administered 2017-02-10: 200 mg via INTRAMUSCULAR

## 2017-02-10 NOTE — Progress Notes (Addendum)
Pre visit review using our clinic tool,if applicable. No additional management support is needed unless otherwise documented below in the visit note.   Patient in for testosterone injection per order from Dr. [name] due to low testosterone level..   No complaints voiced today during visit.  Given 200 mg IM right ventrogluteal muscle. Patient tolerated well.   Return appointment given for 2 weeks per order.

## 2017-02-20 ENCOUNTER — Other Ambulatory Visit: Payer: Self-pay | Admitting: Family Medicine

## 2017-02-21 ENCOUNTER — Telehealth: Payer: Self-pay | Admitting: Family Medicine

## 2017-02-21 NOTE — Telephone Encounter (Signed)
°  Relation to ZO:XWRU Call back number: Pharmacy:  Reason for call:  Patient requesting 90 day supply for metFORMIN (GLUCOPHAGE) 500 MG tablet, tamsulosin (FLOMAX) 0.4 MG CAPS capsule and venlafaxine XR (EFFEXOR-XR) 75 MG 24 hr capsule patient would like to pick up actually scripts for all 3 medications due to patient have a discount card and would like to physically bring rX to [facility]

## 2017-02-22 ENCOUNTER — Telehealth: Payer: Self-pay | Admitting: Family Medicine

## 2017-02-22 NOTE — Telephone Encounter (Signed)
There was an old rx at front desk (May 11,2018) that was never pick up by pt for Tamsulosin (flomax). Rx was shredded.

## 2017-02-23 ENCOUNTER — Ambulatory Visit (INDEPENDENT_AMBULATORY_CARE_PROVIDER_SITE_OTHER): Payer: Medicare Other

## 2017-02-23 DIAGNOSIS — R7989 Other specified abnormal findings of blood chemistry: Secondary | ICD-10-CM

## 2017-02-23 MED ORDER — METFORMIN HCL 500 MG PO TABS: 500.0000 mg | ORAL_TABLET | Freq: Every day | ORAL | 1 refills | Status: DC

## 2017-02-23 MED ORDER — VENLAFAXINE HCL ER 75 MG PO CP24: 75.0000 mg | ORAL_CAPSULE | Freq: Every day | ORAL | 0 refills | Status: DC

## 2017-02-23 MED ORDER — TESTOSTERONE CYPIONATE 100 MG/ML IM SOLN
200.0000 mg | INTRAMUSCULAR | Status: AC
  Administered 2017-02-23: 200 mg via INTRAMUSCULAR

## 2017-02-23 MED ORDER — TAMSULOSIN HCL 0.4 MG PO CAPS: 0.8000 mg | ORAL_CAPSULE | Freq: Every day | ORAL | 1 refills | Status: DC

## 2017-02-23 NOTE — Telephone Encounter (Signed)
rx have been giving to patient     PC

## 2017-02-23 NOTE — Progress Notes (Signed)
PPre visit review using our clinic tool,if applicable. No additional management support is needed unless otherwise documented below in the visit note.   Patient in for Testosterone injection per order from Dr Chauncey Cruel. [name] due to patient having low Testosterone level.  Given 200 mg IM right hip. No complaints voiced this visit.  Appointment scheduled for next injection.

## 2017-03-04 ENCOUNTER — Other Ambulatory Visit: Payer: Self-pay | Admitting: Family Medicine

## 2017-03-06 ENCOUNTER — Telehealth: Payer: Self-pay | Admitting: Family Medicine

## 2017-03-06 ENCOUNTER — Other Ambulatory Visit: Payer: Self-pay

## 2017-03-06 DIAGNOSIS — K219 Gastro-esophageal reflux disease without esophagitis: Secondary | ICD-10-CM

## 2017-03-06 MED ORDER — RANITIDINE HCL 300 MG PO TABS: 300.0000 mg | ORAL_TABLET | Freq: Every day | ORAL | 0 refills | Status: DC

## 2017-03-06 NOTE — Telephone Encounter (Signed)
[facility]ed 90d [facility]/thx dmf

## 2017-03-06 NOTE — Telephone Encounter (Signed)
Relation to FK:CLEX Call back [phone]    FYI-   Reason for call:  Patient scheduled nurse visit for Testosterone Injection stating he brings he's medication, please advise

## 2017-03-07 NOTE — Telephone Encounter (Signed)
Is this ok?

## 2017-03-07 NOTE — Telephone Encounter (Signed)
Yes, we can schedule the patient for 10 am tomorrow, 03/08/17. Thanks.

## 2017-03-08 NOTE — Telephone Encounter (Signed)
Thank you patient scheduled with nurse for 03/24/2017

## 2017-03-10 ENCOUNTER — Ambulatory Visit: Payer: Medicare Other

## 2017-03-15 ENCOUNTER — Encounter (INDEPENDENT_AMBULATORY_CARE_PROVIDER_SITE_OTHER): Payer: Self-pay | Admitting: Specialist

## 2017-03-15 ENCOUNTER — Ambulatory Visit (INDEPENDENT_AMBULATORY_CARE_PROVIDER_SITE_OTHER): Payer: Medicare Other | Admitting: Specialist

## 2017-03-15 VITALS — BP 120/66 | HR 60 | Ht 71.0 in | Wt 200.0 lb

## 2017-03-15 DIAGNOSIS — M7581 Other shoulder lesions, right shoulder: Secondary | ICD-10-CM

## 2017-03-15 DIAGNOSIS — T8484XA Pain due to internal orthopedic prosthetic devices, implants and grafts, initial encounter: Secondary | ICD-10-CM

## 2017-03-15 DIAGNOSIS — M960 Pseudarthrosis after fusion or arthrodesis: Secondary | ICD-10-CM

## 2017-03-15 DIAGNOSIS — T84498A Other mechanical complication of other internal orthopedic devices, implants and grafts, initial encounter: Secondary | ICD-10-CM | POA: Diagnosis not present

## 2017-03-15 DIAGNOSIS — M1712 Unilateral primary osteoarthritis, left knee: Secondary | ICD-10-CM | POA: Diagnosis not present

## 2017-03-15 DIAGNOSIS — M778 Other enthesopathies, not elsewhere classified: Secondary | ICD-10-CM

## 2017-03-15 DIAGNOSIS — M7582 Other shoulder lesions, left shoulder: Secondary | ICD-10-CM

## 2017-03-15 MED ORDER — METHYLPREDNISOLONE ACETATE 40 MG/ML IJ SUSP
40.0000 mg | INTRAMUSCULAR | Status: AC | PRN
  Administered 2017-03-15: 40 mg via INTRA_ARTICULAR

## 2017-03-15 MED ORDER — BUPIVACAINE HCL 0.25 % IJ SOLN
4.0000 mL | INTRAMUSCULAR | Status: AC | PRN
  Administered 2017-03-15: 4 mL via INTRA_ARTICULAR

## 2017-03-15 NOTE — Patient Instructions (Addendum)
  Plan: Avoid overhead lifting and overhead use of the arms. Do not lift greater than 10 lbs. Tylenol ES one every 6-8 hours for pain and inflamation. Call if you are having right knee pain and need to consider a cortisone injection into the right knee. Continue with PT for another 2-3 weeks, then a home exercise program. The main ways of treat osteoarthritis, that are found to be success. Weight loss helps to decrease pain. Exercise is important to maintaining cartilage and thickness and strengthening. NSAIDs like motrin, tylenol, alleve are meds decreasing the inflamation. Ice is okay  In afternoon and evening and hot shower in the am Extension exercises of the knee. Schedule surgical treatment of your thoracolumbar spine, this would include, decompression of the left L5 nerve root Posterior Fusion at L5-S1 removal of hardware, intra articular left SI joint. Decompression of the upper thoracic level of stenosis.

## 2017-03-15 NOTE — Progress Notes (Addendum)
Office Visit Note   Patient: Luis CASHER           Date of Birth: [birth year]           MRN: [id number] Visit Date: 03/15/2017              Requested by: [name], MD [address] [place], [place] [address] PCP: [name], MD   Assessment & Plan: Visit Diagnoses:  1. 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 Pseudarthrosis following spinal fusion   3. Painful orthopaedic hardware [facility])     Plan: Avoid overhead lifting and overhead use of the arms. Do not lift greater than 10 lbs. Tylenol ES one every 6-8 hours for pain and inflamation. Call if you are having right knee pain and need to consider a cortisone injection into the right knee. Continue with PT for another 2-3 weeks, then a home exercise program. The main ways of treat osteoarthritis, that are found to be success. Weight loss helps to decrease pain. Exercise is important to maintaining cartilage and thickness and strengthening. NSAIDs like motrin, tylenol, alleve are meds decreasing the inflamation. Ice is okay  In afternoon and evening and hot shower in the am Extension exercises of the knee.  Schedule surgical treatment of your thoracolumbar spine, this would include, decompression of the left L5 nerve root Posterior Fusion at L5-S1 removal of hardware, intra articular left SI joint. Decompression of the upper thoracic level of stenosis. Follow-Up Instructions: Return in about 4 weeks (around 04/12/2017).   Orders:  No orders of the defined types were placed in this encounter.  No orders of the defined types were placed in this encounter.     Procedures: Large Joint Inj Date/Time: 03/15/2017 11:24 AM Performed by: Jessy Oto Authorized by: Jessy Oto   Consent Given by:  Patient Indications:  Pain Location:  Shoulder Site:  L subacromial bursa Prep: patient was prepped and draped in usual sterile fashion   Needle Size:  25 G Needle Length:  1.5 inches Approach:   Anterolateral Ultrasound Guidance: No   Fluoroscopic Guidance: No   Arthrogram: No   Medications:  40 mg methylPREDNISolone acetate 40 MG/ML; 4 mL bupivacaine 0.25 % Aspiration Attempted: No   Patient tolerance:  Patient tolerated the procedure well with no immediate complications  Band aid applied. Large Joint Inj Date/Time: 03/15/2017 11:26 AM Performed by: Jessy Oto Authorized by: Jessy Oto   Consent Given by:  Patient Site marked: the procedure site was marked   Timeout: prior to procedure the correct patient, procedure, and site was verified   Indications:  Pain Location:  Shoulder Site:  R subacromial bursa Prep: patient was prepped and draped in usual sterile fashion   Needle Size:  25 G Needle Length:  1.5 inches Approach:  Posterior Ultrasound Guidance: No   Fluoroscopic Guidance: No   Arthrogram: No   Medications:  40 mg methylPREDNISolone acetate 40 MG/ML; 4 mL bupivacaine 0.25 % Aspiration Attempted: No   Patient tolerance:  Patient tolerated the procedure well with no immediate complications  Band aid. Large Joint Inj Date/Time: 03/15/2017 11:27 AM Performed by: Jessy Oto Authorized by: Jessy Oto   Consent Given by:  Patient Indications:  Pain Location:  Knee Site:  L knee Prep: patient was prepped and draped in usual sterile fashion   Needle Size:  25 G Needle Length:  1.5 inches Approach:  Anterolateral Ultrasound Guidance:  No   Fluoroscopic Guidance: No   Arthrogram: No   Medications:  40 mg methylPREDNISolone acetate 40 MG/ML; 4 mL bupivacaine 0.25 % Aspiration Attempted: No   Patient tolerance:  Patient tolerated the procedure well with no immediate complications  Band aid applied.     Clinical Data: No additional findings.   Subjective: Chief Complaint  Patient presents with  . Lower Back - Follow-up    71 year old male with Thoracolumbar fusion for stenosis of the L5-S1 foramen bilateral below a L2-L5 fusion and  underwent surgical extension of fusion for sagital kyphosis occurring at the upper end of the lumbar fusion and extension downwards to the sacrum. He is still experiencing numbness in the left leg and weakness in the left leg.    Review of Systems   Objective: Vital Signs: BP 120/66 (BP Location: Left Arm, Patient Position: Sitting)   Pulse 60   Ht 5\' 11"  (1.803 m)   Wt 200 lb (90.7 kg)   BMI 27.89 kg/m   Physical Exam  Ortho Exam  Specialty Comments:  No specialty comments available.  Imaging: No results found.   PMFS History: Patient Active Problem List   Diagnosis Date Noted  . Low back pain radiating to both legs 01/16/2017  . Esophageal reflux 02/10/2015  . Medicare annual wellness visit, subsequent 02/10/2015  . Personal history of colonic polyps 10/27/2012  . 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  . BPH (benign prostatic hyperplasia) 04/23/2012  . ED (erectile dysfunction) 04/23/2012  . Allergic state 10/25/2011  . Anxiety and depression 10/25/2011  . Asthma   . Hyperglycemia   . Hyperlipidemia   . Overweight   . Insomnia   . Fatigue   . Preventative health care   . DDD (degenerative disc disease), lumbosacral   . HTN (hypertension)    Past Medical History:  Diagnosis Date  . Acute pharyngitis 10/21/2013  . Allergy    grass and pollen  . Anxiety and depression 10/25/2011  . Asthma    exercise induced  . BPH (benign prostatic hyperplasia) 04/23/2012  . Chicken pox as a child  . DDD (degenerative disc disease)    cervical responds to steroid injections and low back required surgery  . DDD (degenerative disc disease), lumbosacral   . Diabetes mellitus    pre  . ED (erectile dysfunction) 04/23/2012  . Elevated BP   . Esophageal reflux 02/10/2015  . Fatigue   . HTN (hypertension)   . Hyperglycemia    preDM   . Hyperlipidemia   . Insomnia   . Low back pain radiating to both legs 01/16/2017  . Measles as a child  . Medicare annual wellness visit,  subsequent 02/10/2015  . Overweight(278.02)   . Personal history of colonic polyps 10/27/2012   Follows with [facility]  . Preventative health care   . 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    Family History  Problem Relation Age of Onset  . Hypertension Mother   . Diabetes Mother        type 2  . Cancer Mother 9       breast in remission  . Emphysema Father        smoker  . COPD Father        smoker  . Stroke Father 8       mini  . Heart disease Father   . Hypertension Sister   . Hyperlipidemia Sister   . Scoliosis Sister   . Osteoporosis Sister   . Hypertension Maternal Grandmother   .  Scoliosis Maternal Grandmother   . Heart disease Maternal Grandfather   . Heart disease Paternal Grandfather        smoker  . Heart disease Daughter     Past Surgical History:  Procedure Laterality Date  . BACK SURGERY  2012 and 1994   Dr Margret Chance, screws and cage in low back  . TONSILLECTOMY    . torn rotator cuff  2010   right   Social History   Occupational History  . Not on file.   Social History Main Topics  . Smoking status: Former Smoker    Packs/day: 1.00    Years: 20.00    Types: Cigarettes    Quit date: 07/18/1989  . Smokeless tobacco: Never Used     Comment: off and on smoking for the 20 yrs  . Alcohol use Yes     Comment: drink or two a day- mixed drink  . Drug use: No  . Sexual activity: Yes     Comment: lives with wife, still working, no dietary restrictions, continues to exercise intermittently

## 2017-03-24 ENCOUNTER — Ambulatory Visit (INDEPENDENT_AMBULATORY_CARE_PROVIDER_SITE_OTHER): Payer: Medicare Other

## 2017-03-24 DIAGNOSIS — E349 Endocrine disorder, unspecified: Secondary | ICD-10-CM | POA: Diagnosis not present

## 2017-03-24 MED ORDER — TESTOSTERONE CYPIONATE 100 MG/ML IM SOLN
200.0000 mg | Freq: Once | INTRAMUSCULAR | Status: AC
  Administered 2017-03-24: 200 mg via INTRAMUSCULAR

## 2017-03-24 NOTE — Progress Notes (Signed)
Pre visit review using our clinic tool,if applicable. No additional management support is needed unless otherwise documented below in the visit note.   Patient in for Testosterone injection per order from Dr. Frederik Pear.  No complaints voiced this visit.    Patient given 200 mg IM left hip. Patient tolerated well. Next appointment scheduled for 04/05/17 patient aware.

## 2017-03-25 ENCOUNTER — Other Ambulatory Visit: Payer: Self-pay | Admitting: Family Medicine

## 2017-03-27 ENCOUNTER — Other Ambulatory Visit: Payer: Self-pay

## 2017-03-27 MED ORDER — METFORMIN HCL 500 MG PO TABS: 500.0000 mg | ORAL_TABLET | Freq: Every day | ORAL | 0 refills | Status: DC

## 2017-03-27 MED ORDER — ATORVASTATIN CALCIUM 20 MG PO TABS: ORAL_TABLET | ORAL | 0 refills | Status: DC

## 2017-03-27 NOTE — Telephone Encounter (Signed)
Faxed 90d only of each/thx dmf

## 2017-04-05 ENCOUNTER — Ambulatory Visit (INDEPENDENT_AMBULATORY_CARE_PROVIDER_SITE_OTHER): Payer: Medicare Other

## 2017-04-05 DIAGNOSIS — E349 Endocrine disorder, unspecified: Secondary | ICD-10-CM | POA: Diagnosis not present

## 2017-04-05 MED ORDER — TESTOSTERONE CYPIONATE 100 MG/ML IM SOLN
200.0000 mg | Freq: Once | INTRAMUSCULAR | Status: AC
  Administered 2017-04-05: 200 mg via INTRAMUSCULAR

## 2017-04-05 NOTE — Progress Notes (Signed)
Patient in for Testosterone injection per order from Dr/ S. [name] due to patient having Testosterone deficiency.  No complaints from patient today.  Given 200 mg IM Right ventrogluteal muscle. Patient tolerated well.  Return appointment given for 2 weeks.

## 2017-04-10 ENCOUNTER — Telehealth (INDEPENDENT_AMBULATORY_CARE_PROVIDER_SITE_OTHER): Payer: Self-pay

## 2017-04-10 NOTE — Telephone Encounter (Signed)
Patient left voice mail inquiring about scheduling surgery, which was discussed at last visit.  I do not have a surgery sheet.  Please advise.

## 2017-04-11 ENCOUNTER — Other Ambulatory Visit: Payer: Self-pay | Admitting: Family Medicine

## 2017-04-17 ENCOUNTER — Encounter (INDEPENDENT_AMBULATORY_CARE_PROVIDER_SITE_OTHER): Payer: Self-pay | Admitting: Specialist

## 2017-04-17 DIAGNOSIS — M4804 Spinal stenosis, thoracic region: Secondary | ICD-10-CM | POA: Insufficient documentation

## 2017-04-17 DIAGNOSIS — T84498A Other mechanical complication of other internal orthopedic devices, implants and grafts, initial encounter: Secondary | ICD-10-CM | POA: Insufficient documentation

## 2017-04-17 DIAGNOSIS — M960 Pseudarthrosis after fusion or arthrodesis: Secondary | ICD-10-CM | POA: Insufficient documentation

## 2017-04-17 DIAGNOSIS — T8484XA Pain due to internal orthopedic prosthetic devices, implants and grafts, initial encounter: Secondary | ICD-10-CM | POA: Insufficient documentation

## 2017-04-19 ENCOUNTER — Ambulatory Visit: Payer: Medicare Other

## 2017-04-21 ENCOUNTER — Ambulatory Visit (INDEPENDENT_AMBULATORY_CARE_PROVIDER_SITE_OTHER): Payer: Medicare Other

## 2017-04-21 DIAGNOSIS — R7989 Other specified abnormal findings of blood chemistry: Secondary | ICD-10-CM | POA: Diagnosis not present

## 2017-04-21 MED ORDER — TESTOSTERONE CYPIONATE 100 MG/ML IM SOLN
200.0000 mg | Freq: Once | INTRAMUSCULAR | Status: AC
  Administered 2017-04-21: 200 mg via INTRAMUSCULAR

## 2017-04-21 NOTE — Progress Notes (Signed)
Pre visit review using our clinic tool,if applicable. No additional management support is needed unless otherwise documented below in the visit note.   Patient in for Testosterone injection per order from Dr. Frederik Pear. Given 200 mg IM left ventrogluteal muscle.   Patient tolerated well. No complaints voiced this visit. Appointment scheduled for 2 weeks.

## 2017-04-24 ENCOUNTER — Other Ambulatory Visit (INDEPENDENT_AMBULATORY_CARE_PROVIDER_SITE_OTHER): Payer: Self-pay | Admitting: Specialist

## 2017-04-24 NOTE — Telephone Encounter (Signed)
Tramadol refill request 

## 2017-04-24 NOTE — Telephone Encounter (Signed)
[facility] [address],

## 2017-04-25 ENCOUNTER — Telehealth: Payer: Self-pay | Admitting: Family Medicine

## 2017-04-25 MED ORDER — ATORVASTATIN CALCIUM 20 MG PO TABS: ORAL_TABLET | ORAL | 1 refills | Status: DC

## 2017-04-25 NOTE — Telephone Encounter (Signed)
rx sent to pharmacy. Patient notified. 

## 2017-04-25 NOTE — Telephone Encounter (Signed)
Jong Rickman Self [phone]  [facility] [address]  atorvastatin (LIPITOR) 20 MG tablet   Jaqwon called about his refill, pharmacy said he needed to call our office. Please call and advise!

## 2017-05-01 ENCOUNTER — Other Ambulatory Visit (INDEPENDENT_AMBULATORY_CARE_PROVIDER_SITE_OTHER): Payer: Self-pay | Admitting: Specialist

## 2017-05-01 NOTE — Telephone Encounter (Signed)
Celecoxib refill request  

## 2017-05-05 ENCOUNTER — Ambulatory Visit (INDEPENDENT_AMBULATORY_CARE_PROVIDER_SITE_OTHER): Payer: Medicare Other | Admitting: Behavioral Health

## 2017-05-05 DIAGNOSIS — E349 Endocrine disorder, unspecified: Secondary | ICD-10-CM | POA: Diagnosis not present

## 2017-05-05 DIAGNOSIS — Z23 Encounter for immunization: Secondary | ICD-10-CM

## 2017-05-05 MED ORDER — TESTOSTERONE CYPIONATE 200 MG/ML IM SOLN
200.0000 mg | Freq: Once | INTRAMUSCULAR | Status: AC
  Administered 2017-05-05: 200 mg via INTRAMUSCULAR

## 2017-05-05 NOTE — Progress Notes (Signed)
Pre visit review using our clinic review tool, if applicable. No additional management support is needed unless otherwise documented below in the visit note.  Patient came in clinic today for testosterone injection & influenza vaccination. Patient tolerated both injections well. Next appointment scheduled for 05/19/17 at 10:15 AM.

## 2017-05-15 ENCOUNTER — Other Ambulatory Visit: Payer: Self-pay | Admitting: Family Medicine

## 2017-05-16 NOTE — Pre-Procedure Instructions (Signed)
Luis Brown  05/16/2017      [facility] # [place], [place] [address] [place] [address] Phone: [phone] Fax: [phone]  [id number] [place], [place] - [address] [place] [address] Phone: [phone] Fax: [phone]    Your procedure is scheduled on Nov 6  Report to [place] at 530 A.M.  Call this number if you have problems the morning of surgery:  [phone]   Remember:  Do not eat food or drink liquids after midnight.  Take these medicines the morning of surgery with A SIP OF WATER Acyclovir (Zovirax),  Cetirzine (Zyrtec) if needed, Flonase spray if needed, Proair inhaler if needed, ranitidine (Zantac) Tramadol (Ultram) if needed, Venlafaxine (Effexor-XR)   Stop taking aspirin as directed by your Dr.  7 days prior to surgery stop taking Celebrex, BC's, Goody's, Herbal medications, Vitamins, Ibuprofen, Advil, Motrin, Aleve    How to Manage Your Diabetes Before and After Surgery  Why is it important to control my blood sugar before and after surgery? . Improving blood sugar levels before and after surgery helps healing and can limit problems. . A way of improving blood sugar control is eating a healthy diet by: o  Eating less sugar and carbohydrates o  Increasing activity/exercise o  Talking with your doctor about reaching your blood sugar goals . High blood sugars (greater than 180 mg/dL) can raise your risk of infections and slow your recovery, so you will need to focus on controlling your diabetes during the weeks before surgery. . Make sure that the doctor who takes care of your diabetes knows about your planned surgery including the date and location.  How do I manage my blood sugar before surgery? . Check your blood sugar at least 4 times a day, starting 2 days before surgery, to make sure that the level is not too high or low. o Check your blood  sugar the morning of your surgery when you wake up and every 2 hours until you get to the Short Stay unit. . If your blood sugar is less than 70 mg/dL, you will need to treat for low blood sugar: o Do not take insulin. o Treat a low blood sugar (less than 70 mg/dL) with  cup of clear juice (cranberry or apple), 4 glucose tablets, OR glucose gel. Recheck blood sugar in 15 minutes after treatment (to make sure it is greater than 70 mg/dL). If your blood sugar is not greater than 70 mg/dL on recheck, call [phone] o  for further instructions. . Report your blood sugar to the short stay nurse when you get to Short Stay.  . If you are admitted to the hospital after surgery: o Your blood sugar will be checked by the staff and you will probably be given insulin after surgery (instead of oral diabetes medicines) to make sure you have good blood sugar levels. o The goal for blood sugar control after surgery is 80-180 mg/dL.              WHAT DO I DO ABOUT MY DIABETES MEDICATION?   Marland Kitchen Do not take oral diabetes medicines (pills) the morning of surgery. Metformin (Glucophage)   . THE NIGHT BEFORE SURGERY, take ___________ units of ___________insulin.       Marland Kitchen HE MORNING OF SURGERY, take _____________ units of __________insulin.  . The day of surgery, do not take other diabetes injectables, including  Byetta (exenatide), Bydureon (exenatide ER), Victoza (liraglutide), or Trulicity (dulaglutide).  . If your CBG is greater than 220 mg/dL, you may take  of your sliding scale (correction) dose of insulin.  Other Instructions:          Patient Signature:  Date:   Nurse Signature:  Date:   Reviewed and Endorsed by [facility] Patient Education Committee, August 2015  Do not wear jewelry, make-up or nail polish.  Do not wear lotions, powders, or perfumes, or deoderant.  Do not shave 48 hours prior to surgery.  Men may shave face and neck.  Do not bring valuables to the  hospital.  [facility] is not responsible for any belongings or valuables.  Contacts, dentures or bridgework may not be worn into surgery.  Leave your suitcase in the car.  After surgery it may be brought to your room.  For patients admitted to the hospital, discharge time will be determined by your treatment team.  Patients discharged the day of surgery will not be allowed to drive home.    Special instructions:  [facility] - Preparing for Surgery  Before surgery, you can play an important role.  Because skin is not sterile, your skin needs to be as free of germs as possible.  You can reduce the number of germs on you skin by washing with CHG (chlorahexidine gluconate) soap before surgery.  CHG is an antiseptic cleaner which kills germs and bonds with the skin to continue killing germs even after washing.  Please DO NOT use if you have an allergy to CHG or antibacterial soaps.  If your skin becomes reddened/irritated stop using the CHG and inform your nurse when you arrive at Short Stay.  Do not shave (including legs and underarms) for at least 48 hours prior to the first CHG shower.  You may shave your face.  Please follow these instructions carefully:   1.  Shower with CHG Soap the night before surgery and the   morning of Surgery.  2.  If you choose to wash your hair, wash your hair first as usual with your  normal shampoo.  3.  After you shampoo, rinse your hair and body thoroughly to remove the  Shampoo.  4.  Use CHG as you would any other liquid soap.  You can apply chg directly to the skin and wash gently with scrungie or a clean washcloth.  5.  Apply the CHG Soap to your body ONLY FROM THE NECK DOWN.    Do not use on open wounds or open sores.  Avoid contact with your eyes,   ears, mouth and genitals (private parts).  Wash genitals (private parts)  with your normal soap.  6.  Wash thoroughly, paying special attention to the area where your surgery will be performed.  7.   Thoroughly rinse your body with warm water from the neck down.  8.  DO NOT shower/wash with your normal soap after using and rinsing off the CHG Soap.  9.  Pat yourself dry with a clean towel.            10.  Wear clean pajamas.            11.  Place clean sheets on your bed the night of your first shower and do not sleep with pets.  Day of Surgery  Do not apply any lotions/deoderants the morning of surgery.  Please wear clean clothes to the hospital/surgery center.     Please read over  the following fact sheets that you were given. Pain Booklet, Coughing and Deep Breathing, MRSA Information and Surgical Site Infection Prevention

## 2017-05-17 ENCOUNTER — Ambulatory Visit (HOSPITAL_COMMUNITY)
Admission: RE | Admit: 2017-05-17 | Discharge: 2017-05-17 | Disposition: A | Discharge status: Home / Self Care | Payer: Medicare Other | Source: Ambulatory Visit | Attending: Surgery | Admitting: Surgery

## 2017-05-17 ENCOUNTER — Encounter (HOSPITAL_COMMUNITY): Payer: Self-pay

## 2017-05-17 ENCOUNTER — Encounter (HOSPITAL_COMMUNITY)
Admission: RE | Admit: 2017-05-17 | Discharge: 2017-05-17 | Disposition: A | Discharge status: Home / Self Care | Payer: Medicare Other | Source: Ambulatory Visit | Attending: Specialist | Admitting: Specialist

## 2017-05-17 DIAGNOSIS — M960 Pseudarthrosis after fusion or arthrodesis: Secondary | ICD-10-CM | POA: Diagnosis not present

## 2017-05-17 DIAGNOSIS — Z01810 Encounter for preprocedural cardiovascular examination: Secondary | ICD-10-CM | POA: Diagnosis not present

## 2017-05-17 DIAGNOSIS — J984 Other disorders of lung: Secondary | ICD-10-CM | POA: Diagnosis not present

## 2017-05-17 DIAGNOSIS — M5137 Other intervertebral disc degeneration, lumbosacral region: Secondary | ICD-10-CM | POA: Insufficient documentation

## 2017-05-17 DIAGNOSIS — I10 Essential (primary) hypertension: Secondary | ICD-10-CM | POA: Diagnosis not present

## 2017-05-17 DIAGNOSIS — Z01818 Encounter for other preprocedural examination: Secondary | ICD-10-CM | POA: Diagnosis not present

## 2017-05-17 DIAGNOSIS — M48061 Spinal stenosis, lumbar region without neurogenic claudication: Secondary | ICD-10-CM | POA: Diagnosis not present

## 2017-05-17 DIAGNOSIS — R079 Chest pain, unspecified: Secondary | ICD-10-CM | POA: Diagnosis not present

## 2017-05-17 DIAGNOSIS — Z01812 Encounter for preprocedural laboratory examination: Secondary | ICD-10-CM | POA: Insufficient documentation

## 2017-05-17 DIAGNOSIS — F17200 Nicotine dependence, unspecified, uncomplicated: Secondary | ICD-10-CM | POA: Diagnosis not present

## 2017-05-17 DIAGNOSIS — E663 Overweight: Secondary | ICD-10-CM | POA: Insufficient documentation

## 2017-05-17 DIAGNOSIS — R739 Hyperglycemia, unspecified: Secondary | ICD-10-CM | POA: Insufficient documentation

## 2017-05-17 LAB — COMPREHENSIVE METABOLIC PANEL
ALT: 19 U/L (ref 17–63)
AST: 34 U/L (ref 15–41)
Albumin: 3.9 g/dL (ref 3.5–5.0)
Alkaline Phosphatase: 56 U/L (ref 38–126)
Anion gap: 8 (ref 5–15)
BUN: 13 mg/dL (ref 6–20)
CO2: 26 mmol/L (ref 22–32)
Calcium: 8.6 mg/dL — ABNORMAL LOW (ref 8.9–10.3)
Chloride: 103 mmol/L (ref 101–111)
Creatinine, Ser: 0.92 mg/dL (ref 0.61–1.24)
GFR calc Af Amer: >60 mL/min (ref >60)
GFR calc non Af Amer: >60 mL/min (ref >60)
Glucose, Bld: 138 mg/dL — ABNORMAL HIGH (ref 65–99)
Potassium: 4 mmol/L (ref 3.5–5.1)
Sodium: 137 mmol/L (ref 135–145)
Total Bilirubin: 0.8 mg/dL (ref 0.3–1.2)
Total Protein: 6.2 g/dL — ABNORMAL LOW (ref 6.5–8.1)

## 2017-05-17 LAB — URINALYSIS, ROUTINE W REFLEX MICROSCOPIC
Bacteria, UA: NONE SEEN
Bilirubin Urine: NEGATIVE
Glucose, UA: NEGATIVE mg/dL
Hgb urine dipstick: NEGATIVE
Ketones, ur: 5 mg/dL — AB
Leukocytes, UA: NEGATIVE
Nitrite: NEGATIVE
Protein, ur: 30 mg/dL — AB
Specific Gravity, Urine: 1.024 (ref 1.005–1.030)
Squamous Epithelial / LPF: NONE SEEN
pH: 6 (ref 5.0–8.0)

## 2017-05-17 LAB — CBC
HCT: 45.5 % (ref 39.0–52.0)
Hemoglobin: 15.6 g/dL (ref 13.0–17.0)
MCH: 34.1 pg — ABNORMAL HIGH (ref 26.0–34.0)
MCHC: 34.3 g/dL (ref 30.0–36.0)
MCV: 99.3 fL (ref 78.0–100.0)
Platelets: 234 10*3/uL (ref 150–400)
RBC: 4.58 MIL/uL (ref 4.22–5.81)
RDW: 13.6 % (ref 11.5–15.5)
WBC: 6.5 10*3/uL (ref 4.0–10.5)

## 2017-05-17 LAB — GLUCOSE, CAPILLARY: Glucose-Capillary: 146 mg/dL — ABNORMAL HIGH (ref 65–99)

## 2017-05-17 LAB — APTT: aPTT: 30 seconds (ref 24–36)

## 2017-05-17 LAB — HEMOGLOBIN A1C
Hgb A1c MFr Bld: 6.3 % — ABNORMAL HIGH (ref 4.8–5.6)
Mean Plasma Glucose: 134.11 mg/dL

## 2017-05-17 LAB — SURGICAL PCR SCREEN
MRSA, PCR: NEGATIVE
Staphylococcus aureus: POSITIVE — AB

## 2017-05-17 LAB — PROTIME-INR
INR: 1
Prothrombin Time: 13.1 seconds (ref 11.4–15.2)

## 2017-05-17 NOTE — Progress Notes (Addendum)
PCP: Dr. Vivien Rossetti No cardiologist  Fasting sugars 130  [facility] [address]

## 2017-05-19 ENCOUNTER — Ambulatory Visit (INDEPENDENT_AMBULATORY_CARE_PROVIDER_SITE_OTHER): Payer: Medicare Other

## 2017-05-19 DIAGNOSIS — R7989 Other specified abnormal findings of blood chemistry: Secondary | ICD-10-CM

## 2017-05-19 MED ORDER — TESTOSTERONE CYPIONATE 100 MG/ML IM SOLN
200.0000 mg | Freq: Once | INTRAMUSCULAR | Status: AC
  Administered 2017-05-19: 200 mg via INTRAMUSCULAR

## 2017-05-19 NOTE — Progress Notes (Signed)
Pre visit review using our clinic tool,if applicable. No additional management support is needed unless otherwise documented below in the visit note.   Patient in for Testosterone injection per order from Dr. [name] due to patient having Low Testosterone level.  No complaints voiced this visit.  Given 200 ml IM Right Ventro-gluteal muscle. Patient tolerated well.  Appointment scheduled for next injection in 2 weeks.

## 2017-05-23 ENCOUNTER — Inpatient Hospital Stay (HOSPITAL_COMMUNITY): Payer: Medicare Other

## 2017-05-23 ENCOUNTER — Inpatient Hospital Stay (HOSPITAL_COMMUNITY)
Admission: RE | Admit: 2017-05-23 | Discharge: 2017-05-25 | DRG: 454 | Disposition: A | Discharge status: Home / Self Care | Payer: Medicare Other | Attending: Specialist | Admitting: Specialist

## 2017-05-23 ENCOUNTER — Encounter (HOSPITAL_COMMUNITY)
Admission: RE | Disposition: A | Discharge status: Home / Self Care | Payer: Self-pay | Source: Home / Self Care | Attending: Specialist

## 2017-05-23 ENCOUNTER — Inpatient Hospital Stay (HOSPITAL_COMMUNITY): Payer: Medicare Other | Admitting: Anesthesiology

## 2017-05-23 ENCOUNTER — Encounter (HOSPITAL_COMMUNITY): Payer: Self-pay | Admitting: Anesthesiology

## 2017-05-23 DIAGNOSIS — Z8249 Family history of ischemic heart disease and other diseases of the circulatory system: Secondary | ICD-10-CM | POA: Diagnosis not present

## 2017-05-23 DIAGNOSIS — I10 Essential (primary) hypertension: Secondary | ICD-10-CM | POA: Diagnosis present

## 2017-05-23 DIAGNOSIS — Y838 Other surgical procedures as the cause of abnormal reaction of the patient, or of later complication, without mention of misadventure at the time of the procedure: Secondary | ICD-10-CM | POA: Diagnosis present

## 2017-05-23 DIAGNOSIS — E785 Hyperlipidemia, unspecified: Secondary | ICD-10-CM | POA: Diagnosis not present

## 2017-05-23 DIAGNOSIS — M48062 Spinal stenosis, lumbar region with neurogenic claudication: Secondary | ICD-10-CM | POA: Diagnosis present

## 2017-05-23 DIAGNOSIS — Z79891 Long term (current) use of opiate analgesic: Secondary | ICD-10-CM

## 2017-05-23 DIAGNOSIS — T84226A Displacement of internal fixation device of vertebrae, initial encounter: Secondary | ICD-10-CM | POA: Diagnosis not present

## 2017-05-23 DIAGNOSIS — Z7982 Long term (current) use of aspirin: Secondary | ICD-10-CM

## 2017-05-23 DIAGNOSIS — Z833 Family history of diabetes mellitus: Secondary | ICD-10-CM | POA: Diagnosis not present

## 2017-05-23 DIAGNOSIS — Z4789 Encounter for other orthopedic aftercare: Secondary | ICD-10-CM | POA: Diagnosis not present

## 2017-05-23 DIAGNOSIS — R739 Hyperglycemia, unspecified: Secondary | ICD-10-CM | POA: Diagnosis present

## 2017-05-23 DIAGNOSIS — T84498A Other mechanical complication of other internal orthopedic devices, implants and grafts, initial encounter: Secondary | ICD-10-CM | POA: Diagnosis not present

## 2017-05-23 DIAGNOSIS — Z87891 Personal history of nicotine dependence: Secondary | ICD-10-CM | POA: Diagnosis not present

## 2017-05-23 DIAGNOSIS — N400 Enlarged prostate without lower urinary tract symptoms: Secondary | ICD-10-CM | POA: Diagnosis present

## 2017-05-23 DIAGNOSIS — Z79899 Other long term (current) drug therapy: Secondary | ICD-10-CM

## 2017-05-23 DIAGNOSIS — Z7951 Long term (current) use of inhaled steroids: Secondary | ICD-10-CM | POA: Diagnosis not present

## 2017-05-23 DIAGNOSIS — F329 Major depressive disorder, single episode, unspecified: Secondary | ICD-10-CM | POA: Diagnosis present

## 2017-05-23 DIAGNOSIS — T8484XA Pain due to internal orthopedic prosthetic devices, implants and grafts, initial encounter: Secondary | ICD-10-CM | POA: Diagnosis not present

## 2017-05-23 DIAGNOSIS — Z419 Encounter for procedure for purposes other than remedying health state, unspecified: Secondary | ICD-10-CM

## 2017-05-23 DIAGNOSIS — G4700 Insomnia, unspecified: Secondary | ICD-10-CM | POA: Diagnosis not present

## 2017-05-23 DIAGNOSIS — N529 Male erectile dysfunction, unspecified: Secondary | ICD-10-CM | POA: Diagnosis present

## 2017-05-23 DIAGNOSIS — Z6828 Body mass index (BMI) 28.0-28.9, adult: Secondary | ICD-10-CM

## 2017-05-23 DIAGNOSIS — E663 Overweight: Secondary | ICD-10-CM | POA: Diagnosis present

## 2017-05-23 DIAGNOSIS — K219 Gastro-esophageal reflux disease without esophagitis: Secondary | ICD-10-CM | POA: Diagnosis present

## 2017-05-23 DIAGNOSIS — F419 Anxiety disorder, unspecified: Secondary | ICD-10-CM | POA: Diagnosis present

## 2017-05-23 DIAGNOSIS — Z79890 Hormone replacement therapy (postmenopausal): Secondary | ICD-10-CM | POA: Diagnosis not present

## 2017-05-23 DIAGNOSIS — M960 Pseudarthrosis after fusion or arthrodesis: Secondary | ICD-10-CM | POA: Diagnosis present

## 2017-05-23 DIAGNOSIS — M4804 Spinal stenosis, thoracic region: Secondary | ICD-10-CM | POA: Diagnosis not present

## 2017-05-23 DIAGNOSIS — T8485XA Stenosis due to internal orthopedic prosthetic devices, implants and grafts, initial encounter: Secondary | ICD-10-CM | POA: Diagnosis not present

## 2017-05-23 DIAGNOSIS — M48061 Spinal stenosis, lumbar region without neurogenic claudication: Secondary | ICD-10-CM | POA: Diagnosis present

## 2017-05-23 LAB — GLUCOSE, CAPILLARY
Glucose-Capillary: 139 mg/dL — ABNORMAL HIGH (ref 65–99)
Glucose-Capillary: 161 mg/dL — ABNORMAL HIGH (ref 65–99)

## 2017-05-23 SURGERY — POSTERIOR LUMBAR FUSION 2 WITH HARDWARE REMOVAL
Anesthesia: General | Site: Spine Lumbar

## 2017-05-23 MED ORDER — POLYETHYLENE GLYCOL 3350 17 G PO PACK: 17.0000 g | PACK | Freq: Every day | ORAL | Status: DC | PRN

## 2017-05-23 MED ORDER — SODIUM CHLORIDE 0.9 % IV SOLN: 250.0000 mL | INTRAVENOUS | Status: DC

## 2017-05-23 MED ORDER — THROMBIN 20000 UNITS EX KIT
PACK | CUTANEOUS | Status: AC
  Filled 2017-05-23: qty 1

## 2017-05-23 MED ORDER — EPHEDRINE SULFATE 50 MG/ML IJ SOLN
INTRAMUSCULAR | Status: DC | PRN
  Administered 2017-05-23 (×2): 10 mg via INTRAVENOUS

## 2017-05-23 MED ORDER — ACETAMINOPHEN 650 MG RE SUPP: 650.0000 mg | RECTAL | Status: DC | PRN

## 2017-05-23 MED ORDER — CEFAZOLIN SODIUM-DEXTROSE 2-4 GM/100ML-% IV SOLN
2.0000 g | INTRAVENOUS | Status: AC
  Administered 2017-05-23 (×2): 2 g via INTRAVENOUS

## 2017-05-23 MED ORDER — LIDOCAINE 2% (20 MG/ML) 5 ML SYRINGE
INTRAMUSCULAR | Status: DC | PRN
  Administered 2017-05-23: 60 mg via INTRAVENOUS

## 2017-05-23 MED ORDER — ACYCLOVIR 200 MG PO CAPS: 200.0000 mg | ORAL_CAPSULE | Freq: Every day | ORAL | Status: DC

## 2017-05-23 MED ORDER — OXYCODONE HCL 5 MG PO TABS
10.0000 mg | ORAL_TABLET | ORAL | Status: DC | PRN
Start: 2017-05-23 — End: 2017-05-25
  Filled 2017-05-23: qty 2

## 2017-05-23 MED ORDER — CEFAZOLIN SODIUM-DEXTROSE 2-4 GM/100ML-% IV SOLN
2.0000 g | Freq: Three times a day (TID) | INTRAVENOUS | Status: AC
  Administered 2017-05-23 – 2017-05-24 (×2): 2 g via INTRAVENOUS
  Filled 2017-05-23 (×2): qty 100

## 2017-05-23 MED ORDER — MORPHINE SULFATE (PF) 2 MG/ML IV SOLN: 1.0000 mg | INTRAVENOUS | Status: DC | PRN

## 2017-05-23 MED ORDER — ALBUMIN HUMAN 5 % IV SOLN
INTRAVENOUS | Status: DC | PRN
  Administered 2017-05-23 (×2): via INTRAVENOUS

## 2017-05-23 MED ORDER — LISINOPRIL 5 MG PO TABS
5.0000 mg | ORAL_TABLET | Freq: Every day | ORAL | Status: DC
  Administered 2017-05-24 – 2017-05-25 (×2): 5 mg via ORAL
  Filled 2017-05-23 (×2): qty 1

## 2017-05-23 MED ORDER — ATORVASTATIN CALCIUM 10 MG PO TABS
10.0000 mg | ORAL_TABLET | Freq: Every day | ORAL | Status: DC
  Administered 2017-05-24: 10 mg via ORAL
  Filled 2017-05-23: qty 1

## 2017-05-23 MED ORDER — FENTANYL CITRATE (PF) 100 MCG/2ML IJ SOLN
INTRAMUSCULAR | Status: DC | PRN
  Administered 2017-05-23 (×2): 25 ug via INTRAVENOUS
  Administered 2017-05-23: 100 ug via INTRAVENOUS
  Administered 2017-05-23: 50 ug via INTRAVENOUS
  Administered 2017-05-23: 25 ug via INTRAVENOUS
  Administered 2017-05-23: 50 ug via INTRAVENOUS
  Administered 2017-05-23: 25 ug via INTRAVENOUS
  Administered 2017-05-23: 50 ug via INTRAVENOUS
  Administered 2017-05-23 (×3): 25 ug via INTRAVENOUS

## 2017-05-23 MED ORDER — OXYCODONE HCL 5 MG/5ML PO SOLN: 5.0000 mg | Freq: Once | ORAL | Status: DC | PRN

## 2017-05-23 MED ORDER — PROPOFOL 10 MG/ML IV BOLUS
INTRAVENOUS | Status: DC | PRN
  Administered 2017-05-23: 150 mg via INTRAVENOUS

## 2017-05-23 MED ORDER — BUPIVACAINE HCL 0.5 % IJ SOLN
INTRAMUSCULAR | Status: DC | PRN
  Administered 2017-05-23: 20 mL

## 2017-05-23 MED ORDER — PHENYLEPHRINE HCL 10 MG/ML IJ SOLN
INTRAVENOUS | Status: DC | PRN
  Administered 2017-05-23: 20 ug/min via INTRAVENOUS

## 2017-05-23 MED ORDER — PHENOL 1.4 % MT LIQD: 1.0000 | OROMUCOSAL | Status: DC | PRN

## 2017-05-23 MED ORDER — 0.9 % SODIUM CHLORIDE (POUR BTL) OPTIME
TOPICAL | Status: DC | PRN
Start: 2017-05-23 — End: 2017-05-23
  Administered 2017-05-23: 1000 mL

## 2017-05-23 MED ORDER — FENTANYL CITRATE (PF) 250 MCG/5ML IJ SOLN
INTRAMUSCULAR | Status: AC
  Filled 2017-05-23: qty 5

## 2017-05-23 MED ORDER — SUGAMMADEX SODIUM 200 MG/2ML IV SOLN
INTRAVENOUS | Status: DC | PRN
  Administered 2017-05-23: 100 mg via INTRAVENOUS

## 2017-05-23 MED ORDER — BUPIVACAINE HCL (PF) 0.5 % IJ SOLN
INTRAMUSCULAR | Status: AC
  Filled 2017-05-23: qty 30

## 2017-05-23 MED ORDER — ASPIRIN EC 81 MG PO TBEC
81.0000 mg | DELAYED_RELEASE_TABLET | Freq: Every day | ORAL | Status: DC
  Administered 2017-05-24 – 2017-05-25 (×2): 81 mg via ORAL
  Filled 2017-05-23 (×2): qty 1

## 2017-05-23 MED ORDER — TAMSULOSIN HCL 0.4 MG PO CAPS
0.8000 mg | ORAL_CAPSULE | Freq: Every evening | ORAL | Status: DC
  Administered 2017-05-23 – 2017-05-24 (×2): 0.8 mg via ORAL
  Filled 2017-05-23 (×2): qty 2

## 2017-05-23 MED ORDER — B COMPLEX-C PO TABS
1.0000 | ORAL_TABLET | Freq: Every day | ORAL | Status: DC
  Administered 2017-05-23 – 2017-05-25 (×3): 1 via ORAL
  Filled 2017-05-23 (×5): qty 1

## 2017-05-23 MED ORDER — SODIUM CHLORIDE 0.9 % IV SOLN
INTRAVENOUS | Status: DC
  Administered 2017-05-23: 19:00:00 via INTRAVENOUS

## 2017-05-23 MED ORDER — HYDROCODONE-ACETAMINOPHEN 7.5-325 MG PO TABS
1.0000 | ORAL_TABLET | Freq: Four times a day (QID) | ORAL | Status: DC
  Administered 2017-05-23 – 2017-05-25 (×6): 1 via ORAL
  Filled 2017-05-23 (×6): qty 1

## 2017-05-23 MED ORDER — MIDAZOLAM HCL 2 MG/2ML IJ SOLN
INTRAMUSCULAR | Status: AC
  Filled 2017-05-23: qty 2

## 2017-05-23 MED ORDER — SODIUM CHLORIDE 0.9 % IJ SOLN
INTRAMUSCULAR | Status: AC
  Filled 2017-05-23: qty 10

## 2017-05-23 MED ORDER — SODIUM CHLORIDE 0.9% FLUSH
3.0000 mL | Freq: Two times a day (BID) | INTRAVENOUS | Status: DC
  Administered 2017-05-24 (×2): 3 mL via INTRAVENOUS

## 2017-05-23 MED ORDER — LIDOCAINE 2% (20 MG/ML) 5 ML SYRINGE
INTRAMUSCULAR | Status: AC
  Filled 2017-05-23: qty 5

## 2017-05-23 MED ORDER — CHLORHEXIDINE GLUCONATE 4 % EX LIQD: 60.0000 mL | Freq: Once | CUTANEOUS | Status: DC

## 2017-05-23 MED ORDER — ROCURONIUM BROMIDE 100 MG/10ML IV SOLN
INTRAVENOUS | Status: DC | PRN
  Administered 2017-05-23: 20 mg via INTRAVENOUS
  Administered 2017-05-23: 50 mg via INTRAVENOUS
  Administered 2017-05-23: 30 mg via INTRAVENOUS

## 2017-05-23 MED ORDER — ONDANSETRON HCL 4 MG/2ML IJ SOLN
INTRAMUSCULAR | Status: AC
  Filled 2017-05-23: qty 2

## 2017-05-23 MED ORDER — ONDANSETRON HCL 4 MG/2ML IJ SOLN: 4.0000 mg | Freq: Once | INTRAMUSCULAR | Status: DC | PRN

## 2017-05-23 MED ORDER — LACTATED RINGERS IV SOLN
INTRAVENOUS | Status: DC | PRN
  Administered 2017-05-23: 07:00:00 via INTRAVENOUS

## 2017-05-23 MED ORDER — BUPIVACAINE LIPOSOME 1.3 % IJ SUSP
20.0000 mL | Freq: Once | INTRAMUSCULAR | Status: DC
  Filled 2017-05-23: qty 20

## 2017-05-23 MED ORDER — ONDANSETRON HCL 4 MG PO TABS: 4.0000 mg | ORAL_TABLET | Freq: Four times a day (QID) | ORAL | Status: DC | PRN

## 2017-05-23 MED ORDER — HYDROCODONE-ACETAMINOPHEN 7.5-325 MG PO TABS
1.0000 | ORAL_TABLET | ORAL | Status: DC | PRN
  Administered 2017-05-25: 1 via ORAL
  Filled 2017-05-23 (×2): qty 1

## 2017-05-23 MED ORDER — HEMOSTATIC AGENTS (NO CHARGE) OPTIME
TOPICAL | Status: DC | PRN
  Administered 2017-05-23: 1 via TOPICAL

## 2017-05-23 MED ORDER — CEFAZOLIN SODIUM-DEXTROSE 2-4 GM/100ML-% IV SOLN
INTRAVENOUS | Status: AC
  Filled 2017-05-23: qty 100

## 2017-05-23 MED ORDER — THROMBIN (RECOMBINANT) 20000 UNITS EX SOLR
CUTANEOUS | Status: DC | PRN
  Administered 2017-05-23: 20000 [IU] via TOPICAL

## 2017-05-23 MED ORDER — VECURONIUM BROMIDE 10 MG IV SOLR
INTRAVENOUS | Status: DC | PRN
Start: 2017-05-23 — End: 2017-05-23
  Administered 2017-05-23 (×2): 2 mg via INTRAVENOUS
  Administered 2017-05-23: 1 mg via INTRAVENOUS

## 2017-05-23 MED ORDER — FENTANYL CITRATE (PF) 100 MCG/2ML IJ SOLN
25.0000 ug | INTRAMUSCULAR | Status: DC | PRN
  Administered 2017-05-23: 50 ug via INTRAVENOUS

## 2017-05-23 MED ORDER — OXYCODONE HCL 5 MG PO TABS: 5.0000 mg | ORAL_TABLET | Freq: Once | ORAL | Status: DC | PRN

## 2017-05-23 MED ORDER — MIDAZOLAM HCL 5 MG/5ML IJ SOLN
INTRAMUSCULAR | Status: DC | PRN
  Administered 2017-05-23: 2 mg via INTRAVENOUS

## 2017-05-23 MED ORDER — BUPIVACAINE LIPOSOME 1.3 % IJ SUSP
INTRAMUSCULAR | Status: DC | PRN
Start: 2017-05-23 — End: 2017-05-23
  Administered 2017-05-23: 20 mL

## 2017-05-23 MED ORDER — MENTHOL 3 MG MT LOZG: 1.0000 | LOZENGE | OROMUCOSAL | Status: DC | PRN

## 2017-05-23 MED ORDER — METOCLOPRAMIDE HCL 5 MG/ML IJ SOLN
INTRAMUSCULAR | Status: DC | PRN
  Administered 2017-05-23: 10 mg via INTRAVENOUS

## 2017-05-23 MED ORDER — KETOROLAC TROMETHAMINE 15 MG/ML IJ SOLN
15.0000 mg | Freq: Four times a day (QID) | INTRAMUSCULAR | Status: AC
  Administered 2017-05-23 – 2017-05-24 (×3): 15 mg via INTRAVENOUS
  Filled 2017-05-23 (×4): qty 1

## 2017-05-23 MED ORDER — DOCUSATE SODIUM 100 MG PO CAPS
100.0000 mg | ORAL_CAPSULE | Freq: Two times a day (BID) | ORAL | Status: DC
  Administered 2017-05-23 – 2017-05-25 (×4): 100 mg via ORAL
  Filled 2017-05-23 (×4): qty 1

## 2017-05-23 MED ORDER — VECURONIUM BROMIDE 10 MG IV SOLR
INTRAVENOUS | Status: AC
  Filled 2017-05-23: qty 10

## 2017-05-23 MED ORDER — ONDANSETRON HCL 4 MG/2ML IJ SOLN
INTRAMUSCULAR | Status: DC | PRN
  Administered 2017-05-23: 4 mg via INTRAVENOUS

## 2017-05-23 MED ORDER — FLEET ENEMA 7-19 GM/118ML RE ENEM: 1.0000 | ENEMA | Freq: Once | RECTAL | Status: DC | PRN

## 2017-05-23 MED ORDER — CEFAZOLIN SODIUM 1 G IJ SOLR
INTRAMUSCULAR | Status: AC
  Filled 2017-05-23: qty 20

## 2017-05-23 MED ORDER — VANCOMYCIN HCL 1000 MG IV SOLR
INTRAVENOUS | Status: DC | PRN
  Administered 2017-05-23: 1000 mg via TOPICAL

## 2017-05-23 MED ORDER — ONDANSETRON HCL 4 MG/2ML IJ SOLN: 4.0000 mg | Freq: Four times a day (QID) | INTRAMUSCULAR | Status: DC | PRN

## 2017-05-23 MED ORDER — METHOCARBAMOL 500 MG PO TABS: 500.0000 mg | ORAL_TABLET | Freq: Four times a day (QID) | ORAL | Status: DC | PRN

## 2017-05-23 MED ORDER — EPHEDRINE 5 MG/ML INJ
INTRAVENOUS | Status: AC
  Filled 2017-05-23: qty 10

## 2017-05-23 MED ORDER — DEXAMETHASONE SODIUM PHOSPHATE 4 MG/ML IJ SOLN
INTRAMUSCULAR | Status: DC | PRN
  Administered 2017-05-23: 10 mg via INTRAVENOUS

## 2017-05-23 MED ORDER — METHOCARBAMOL 1000 MG/10ML IJ SOLN
500.0000 mg | Freq: Four times a day (QID) | INTRAVENOUS | Status: DC | PRN
  Filled 2017-05-23: qty 5

## 2017-05-23 MED ORDER — ALUM & MAG HYDROXIDE-SIMETH 200-200-20 MG/5ML PO SUSP
30.0000 mL | Freq: Four times a day (QID) | ORAL | Status: DC | PRN
  Administered 2017-05-24: 30 mL via ORAL
  Filled 2017-05-23: qty 30

## 2017-05-23 MED ORDER — FENTANYL CITRATE (PF) 100 MCG/2ML IJ SOLN
INTRAMUSCULAR | Status: AC
  Administered 2017-05-23: 50 ug via INTRAVENOUS
  Filled 2017-05-23: qty 2

## 2017-05-23 MED ORDER — GABAPENTIN 300 MG PO CAPS
300.0000 mg | ORAL_CAPSULE | Freq: Three times a day (TID) | ORAL | Status: DC
  Administered 2017-05-23 – 2017-05-25 (×5): 300 mg via ORAL
  Filled 2017-05-23 (×5): qty 1

## 2017-05-23 MED ORDER — ACETAMINOPHEN 325 MG PO TABS: 650.0000 mg | ORAL_TABLET | ORAL | Status: DC | PRN

## 2017-05-23 MED ORDER — ROCURONIUM BROMIDE 10 MG/ML (PF) SYRINGE
PREFILLED_SYRINGE | INTRAVENOUS | Status: AC
  Filled 2017-05-23: qty 5

## 2017-05-23 MED ORDER — SODIUM CHLORIDE 0.9% FLUSH: 3.0000 mL | INTRAVENOUS | Status: DC | PRN

## 2017-05-23 MED ORDER — DEXAMETHASONE SODIUM PHOSPHATE 10 MG/ML IJ SOLN
INTRAMUSCULAR | Status: AC
  Filled 2017-05-23: qty 1

## 2017-05-23 MED ORDER — THROMBIN (RECOMBINANT) 20000 UNITS EX SOLR
CUTANEOUS | Status: DC | PRN
  Administered 2017-05-23: 20 mL via TOPICAL

## 2017-05-23 MED ORDER — BISACODYL 5 MG PO TBEC: 5.0000 mg | DELAYED_RELEASE_TABLET | Freq: Every day | ORAL | Status: DC | PRN

## 2017-05-23 MED ORDER — PROPOFOL 10 MG/ML IV BOLUS
INTRAVENOUS | Status: AC
  Filled 2017-05-23: qty 20

## 2017-05-23 MED ORDER — PANTOPRAZOLE SODIUM 40 MG IV SOLR
40.0000 mg | Freq: Every day | INTRAVENOUS | Status: DC
  Administered 2017-05-23: 40 mg via INTRAVENOUS
  Filled 2017-05-23: qty 40

## 2017-05-23 MED ORDER — VANCOMYCIN HCL 1000 MG IV SOLR
INTRAVENOUS | Status: AC
  Filled 2017-05-23: qty 1000

## 2017-05-23 SURGICAL SUPPLY — 76 items
ADH SKN CLS APL DERMABOND .7 (GAUZE/BANDAGES/DRESSINGS) ×1
APL SKNCLS STERI-STRIP NONHPOA (GAUZE/BANDAGES/DRESSINGS) ×1
APL SRG 60D 8 XTD TIP BNDBL (TIP) ×1
BENZOIN TINCTURE PRP APPL 2/3 (GAUZE/BANDAGES/DRESSINGS) ×2 IMPLANT
BLADE CLIPPER SURG (BLADE) ×2 IMPLANT
BONE VIVIGEN FORMABLE 10CC (Bone Implant) ×2 IMPLANT
BUR MATCHSTICK NEURO 3.0 LAGG (BURR) ×2 IMPLANT
BUR RND FLUTED 2.5 (BURR) IMPLANT
CLSR STERI-STRIP ANTIMIC 1/2X4 (GAUZE/BANDAGES/DRESSINGS) ×1 IMPLANT
CONNECTOR EXPEDIUM TI 55MM (Connector) ×5 IMPLANT
CONNECTOR OPEN POLY LATERAL (Connector) ×2 IMPLANT
COVER BACK TABLE 80X110 HD (DRAPES) ×2 IMPLANT
COVER MAYO STAND STRL (DRAPES) ×4 IMPLANT
COVER SURGICAL LIGHT HANDLE (MISCELLANEOUS) ×2 IMPLANT
DECANTER SPIKE VIAL GLASS SM (MISCELLANEOUS) ×3 IMPLANT
DERMABOND ADVANCED (GAUZE/BANDAGES/DRESSINGS) ×1
DERMABOND ADVANCED .7 DNX12 (GAUZE/BANDAGES/DRESSINGS) ×1 IMPLANT
DRAPE C-ARM 42X72 X-RAY (DRAPES) ×4 IMPLANT
DRAPE MICROSCOPE LEICA (MISCELLANEOUS) ×1 IMPLANT
DRAPE SURG 17X23 STRL (DRAPES) ×8 IMPLANT
DRSG MEPILEX BORDER 4X4 (GAUZE/BANDAGES/DRESSINGS) IMPLANT
DRSG MEPILEX BORDER 4X8 (GAUZE/BANDAGES/DRESSINGS) ×1 IMPLANT
DURAPREP 26ML APPLICATOR (WOUND CARE) ×2 IMPLANT
DURASEAL APPLICATOR TIP (TIP) ×1 IMPLANT
DURASEAL SPINE SEALANT 3ML (MISCELLANEOUS) ×1 IMPLANT
ELECT BLADE 6.5 EXT (BLADE) IMPLANT
ELECT CAUTERY BLADE 6.4 (BLADE) ×2 IMPLANT
ELECT REM PT RETURN 9FT ADLT (ELECTROSURGICAL) ×2
ELECTRODE REM PT RTRN 9FT ADLT (ELECTROSURGICAL) ×1 IMPLANT
EVACUATOR 1/8 PVC DRAIN (DRAIN) ×1 IMPLANT
GAUZE SPONGE 4X4 12PLY STRL (GAUZE/BANDAGES/DRESSINGS) ×1 IMPLANT
GLOVE BIOGEL PI IND STRL 8 (GLOVE) ×1 IMPLANT
GLOVE BIOGEL PI INDICATOR 8 (GLOVE) ×1
GLOVE ECLIPSE 9.0 STRL (GLOVE) ×2 IMPLANT
GLOVE ORTHO TXT STRL SZ7.5 (GLOVE) ×2 IMPLANT
GLOVE SURG 8.5 LATEX PF (GLOVE) ×2 IMPLANT
GOWN STRL REUS W/ TWL LRG LVL3 (GOWN DISPOSABLE) ×1 IMPLANT
GOWN STRL REUS W/TWL 2XL LVL3 (GOWN DISPOSABLE) ×4 IMPLANT
GOWN STRL REUS W/TWL LRG LVL3 (GOWN DISPOSABLE) ×2
KIT BASIN OR (CUSTOM PROCEDURE TRAY) ×2 IMPLANT
KIT INFUSE SMALL (Orthopedic Implant) ×1 IMPLANT
KIT POSITION SURG JACKSON T1 (MISCELLANEOUS) ×2 IMPLANT
KIT ROOM TURNOVER OR (KITS) ×2 IMPLANT
NDL SPNL 18GX3.5 QUINCKE PK (NEEDLE) ×1 IMPLANT
NEEDLE 22X1 1/2 (OR ONLY) (NEEDLE) ×2 IMPLANT
NEEDLE RANFAC BLUNT 8X15 (NEEDLE) IMPLANT
NEEDLE SPNL 18GX3.5 QUINCKE PK (NEEDLE) ×2 IMPLANT
NS IRRIG 1000ML POUR BTL (IV SOLUTION) ×4 IMPLANT
PACK LAMINECTOMY ORTHO (CUSTOM PROCEDURE TRAY) ×2 IMPLANT
PAD ARMBOARD 7.5X6 YLW CONV (MISCELLANEOUS) ×4 IMPLANT
PATTIES SURGICAL .75X.75 (GAUZE/BANDAGES/DRESSINGS) ×2 IMPLANT
RASP HELIOCORDIAL MED (MISCELLANEOUS) ×1 IMPLANT
ROD EXPEDIUM 5.5X300MM (Rod) ×1 IMPLANT
ROD EXPEDIUM PREBENT 95MM (Rod) ×2 IMPLANT
SCREW SET SINGLE INNER (Screw) ×5 IMPLANT
SCREW VIPER 7X50MM (Screw) ×2 IMPLANT
SCREW VIPER 8X45MM (Screw) ×3 IMPLANT
SPONGE LAP 4X18 X RAY DECT (DISPOSABLE) ×2 IMPLANT
SPONGE SURGIFOAM ABS GEL 100C (HEMOSTASIS) ×1 IMPLANT
SUT ETHILON 2 0 FS 18 (SUTURE) ×1 IMPLANT
SUT VIC AB 0 CT1 27 (SUTURE) ×4
SUT VIC AB 0 CT1 27XBRD ANBCTR (SUTURE) ×1 IMPLANT
SUT VIC AB 1 CTX 36 (SUTURE) ×6
SUT VIC AB 1 CTX36XBRD ANBCTR (SUTURE) ×2 IMPLANT
SUT VIC AB 2-0 CT1 27 (SUTURE) ×4
SUT VIC AB 2-0 CT1 TAPERPNT 27 (SUTURE) ×1 IMPLANT
SUT VIC AB 3-0 X1 27 (SUTURE) ×2 IMPLANT
SYR 20CC LL (SYRINGE) ×2 IMPLANT
SYR CONTROL 10ML LL (SYRINGE) ×4 IMPLANT
TAPE CLOTH SURG 4X10 WHT LF (GAUZE/BANDAGES/DRESSINGS) ×1 IMPLANT
TOWEL OR 17X24 6PK STRL BLUE (TOWEL DISPOSABLE) ×2 IMPLANT
TOWEL OR 17X26 10 PK STRL BLUE (TOWEL DISPOSABLE) ×2 IMPLANT
TRAY FOLEY W/METER SILVER 16FR (SET/KITS/TRAYS/PACK) ×2 IMPLANT
TUBE CONNECTING 12X1/4 (SUCTIONS) ×1 IMPLANT
WATER STERILE IRR 1000ML POUR (IV SOLUTION) IMPLANT
YANKAUER SUCT BULB TIP NO VENT (SUCTIONS) ×3 IMPLANT

## 2017-05-23 NOTE — Anesthesia Preprocedure Evaluation (Signed)
Anesthesia Evaluation  Patient identified by MRN, date of birth, ID band Patient awake    Reviewed: Allergy & Precautions, NPO status , Patient's Chart, lab work & pertinent test results  Airway Mallampati: II  TM Distance: >3 FB Neck ROM: Full    Dental  (+) Teeth Intact, Dental Advisory Given   Pulmonary former smoker,    breath sounds clear to auscultation       Cardiovascular hypertension,  Rhythm:Regular Rate:Normal     Neuro/Psych    GI/Hepatic   Endo/Other  diabetes  Renal/GU      Musculoskeletal   Abdominal   Peds  Hematology   Anesthesia Other Findings   Reproductive/Obstetrics                             Anesthesia Physical Anesthesia Plan  ASA: III  Anesthesia Plan: General   Post-op Pain Management:    Induction:   PONV Risk Score and Plan: 2 and 1 and Ondansetron and Metaclopromide  Airway Management Planned: Oral ETT  Additional Equipment:   Intra-op Plan:   Post-operative Plan: Extubation in OR  Informed Consent: I have reviewed the patients History and Physical, chart, labs and discussed the procedure including the risks, benefits and alternatives for the proposed anesthesia with the patient or authorized representative who has indicated his/her understanding and acceptance.   Dental advisory given  Plan Discussed with: CRNA and Anesthesiologist  Anesthesia Plan Comments:         Anesthesia Quick Evaluation

## 2017-05-23 NOTE — Anesthesia Procedure Notes (Signed)
Procedure Name: Intubation Date/Time: 05/23/2017 7:48 AM Performed by: Lieutenant Diego, CRNA Pre-anesthesia Checklist: Patient identified, Emergency Drugs available, Suction available and Patient being monitored Patient Re-evaluated:Patient Re-evaluated prior to induction Oxygen Delivery Method: Circle system utilized Preoxygenation: Pre-oxygenation with 100% oxygen Induction Type: IV induction Ventilation: Mask ventilation without difficulty Laryngoscope Size: Miller and 2 Grade View: Grade I Tube type: Oral Tube size: 7.5 mm Number of attempts: 1 Airway Equipment and Method: Stylet and Oral airway Placement Confirmation: ETT inserted through vocal cords under direct vision,  positive ETCO2 and breath sounds checked- equal and bilateral Secured at: 22 cm Tube secured with: Tape Dental Injury: Teeth and Oropharynx as per pre-operative assessment

## 2017-05-23 NOTE — Brief Op Note (Addendum)
05/23/2017  2:26 PM  PATIENT:  Luis Brown  71 y.o. 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 Linna Caprice.   EBL:  850 mL   BLOOD ADMINISTERED:none  DRAINS: (medium) Hemovact drain(s) in the left and midline lumbar spine, exits left lower lumbar spine with  Suction Open and Urinary Catheter (Foley)   LOCAL MEDICATIONS USED:  MARCAINE 0.5% 1:1 EXPAREL 1.3% Amount: 30 ml  FINDINGS: Left lateral recess stenosis L5-S1 affecting the left S1 nerve root, foramenal stenosis left L5 nerve root compression. Non union L5-S1 inter body fusion. Broken rods left at the upper aspect of the S1 fastener, right at the lower end of the right L5 fastener.Loosening of bilateral iliac wing screws and right S1 screw.  SPECIMEN:  No Specimen  DISPOSITION OF SPECIMEN:  N/A   The T11-12 laminectomy was not done due to error in level of most significant stenosis level, length of surgery and having found significant cause for this patients left buttock and left leg pain.   COUNTS:  YES  TOURNIQUET:  * No tourniquets in log *  DICTATION: .Dragon Dictation  PLAN OF CARE: Admit to inpatient   PATIENT DISPOSITION:  PACU - hemodynamically stable.   Delay start of Pharmacological VTE agent (>24hrs) due to surgical blood loss or risk of  bleeding: yes

## 2017-05-23 NOTE — Discharge Instructions (Addendum)
° ° °  Call if there is increasing drainage, fever greater than 101.5, severe head aches, and °worsening nausea or light sensitivity. °If shortness of breath, bloody cough or chest tightness or pain go to an emergency room. °No lifting greater than 10 lbs. °Avoid bending, stooping and twisting. °Use brace when sitting and out of bed even to go to bathroom. °Walk in house for first 2 weeks then may start to get out slowly increasing distances up to one mile by 4-6 weeks post op. °After 5 days may shower and change dressing following bathing with shower.When °bathing remove the brace shower and replace brace before getting out of the shower. °If drainage, keep dry dressing and do not bathe the incision, use an moisture impervious dressing. °Please call and return for scheduled follow up appointment 2 weeks from the time of surgery. ° ° °

## 2017-05-23 NOTE — Transfer of Care (Signed)
Immediate Anesthesia Transfer of Care Note  Patient: Luis Brown  Procedure(s) Performed: Removal of left sacroiliac screw, explore fusion L4 to S1, Reinsert hardware L5-S1, left L5 nerve decompression, local bone, allograft and Vivigen bone graft (N/A Spine Lumbar)  Patient Location: PACU  Anesthesia Type:General  Level of Consciousness: sedated  Airway & Oxygen Therapy: Patient Spontanous Breathing and Patient connected to face mask oxygen  Post-op Assessment: Report given to RN and Post -op Vital signs reviewed and stable  Post vital signs: Reviewed and stable  Last Vitals:  Vitals:   05/23/17 0637 05/23/17 1455  BP: (!) 148/69 119/64  Pulse: 77 88  Resp: 20 15  Temp: 37 C   SpO2: 99% 100%    Last Pain:  Vitals:   05/23/17 0637  TempSrc: Oral  PainSc: 4       Patients Stated Pain Goal: 6 (18/40/37 5436)  Complications: No apparent anesthesia complications

## 2017-05-23 NOTE — H&P (Signed)
Luis Brown is an 71 y.o. male.   Chief Complaint: back pain and leg pain HPI: patient with hx of lumbar stenosis, pseudoarthrosis presents with the above complaint.  Progressively worsening symptoms.  Failed conservative treatment.    Past Medical History:  Diagnosis Date  . Acute pharyngitis 10/21/2013  . Allergy    grass and pollen  . Anxiety and depression 10/25/2011  . BPH (benign prostatic hyperplasia) 04/23/2012  . Chicken pox as a child  . DDD (degenerative disc disease)    cervical responds to steroid injections and low back required surgery  . DDD (degenerative disc disease), lumbosacral   . Diabetes mellitus    pre  . ED (erectile dysfunction) 04/23/2012  . Elevated BP   . Esophageal reflux 02/10/2015  . Fatigue   . HTN (hypertension)   . Hyperglycemia    preDM   . Hyperlipidemia   . Insomnia   . Low back pain radiating to both legs 01/16/2017  . Measles as a child  . Medicare annual wellness visit, subsequent 02/10/2015  . Overweight(278.02)   . Personal history of colonic polyps 10/27/2012   Follows with [facility]  . Preventative health care   . 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    Past Surgical History:  Procedure Laterality Date  . BACK SURGERY  2012 and 1994   Dr Margret Chance, screws and cage in low back  . TONSILLECTOMY    . torn rotator cuff  2010   right    Family History  Problem Relation Age of Onset  . Hypertension Mother   . Diabetes Mother        type 2  . Cancer Mother 97       breast in remission  . Emphysema Father        smoker  . COPD Father        smoker  . Stroke Father 5       mini  . Heart disease Father   . Hypertension Sister   . Hyperlipidemia Sister   . Scoliosis Sister   . Osteoporosis Sister   . Hypertension Maternal Grandmother   . Scoliosis Maternal Grandmother   . Heart disease Maternal Grandfather   . Heart disease Paternal Grandfather        smoker  . Heart disease Daughter    Social History:  reports  that he quit smoking about 27 years ago. His smoking use included cigarettes. He has a 20.00 pack-year smoking history. he has never used smokeless tobacco. He reports that he drinks alcohol. He reports that he does not use drugs.  Allergies: No Known Allergies  Medications Prior to Admission  Medication Sig Dispense Refill  . acyclovir (ZOVIRAX) 200 MG capsule TAKE ONE CAPSULE BY MOUTH 5 TIMES A DAY 25 capsule 1  . aspirin 81 MG tablet Take 81 mg by mouth daily.    Marland Kitchen atorvastatin (LIPITOR) 20 MG tablet TAKE 1/2 TABLET BY MOUTH DAILY AT 6 PM (Patient taking differently: Take 10 mg by mouth daily at 6 PM. TAKE 1/2 TABLET BY MOUTH DAILY AT 6 PM) 90 tablet 1  . b complex vitamins tablet Take 1 tablet by mouth daily.    . celecoxib (CELEBREX) 200 MG capsule TAKE 1 CAPSULE BY MOUTH ONCE DAILY  90 capsule 0  . diazepam (VALIUM) 10 MG tablet TAKE 1 TABLET BY MOUTH EVERY DAY AT BEDTIME AS NEEDED (Patient taking differently: Take 10 mg by mouth at bedtime as needed for sleep. TAKE 1 TABLET BY  MOUTH EVERY DAY AT BEDTIME AS NEEDE) 30 tablet 2  . lisinopril (PRINIVIL,ZESTRIL) 5 MG tablet TAKE 1 TABLET BY MOUTH EVERY DAY (Patient taking differently: TAKE 1 TABLET BY MOUTH EVERY EVENING) 90 tablet 1  . metFORMIN (GLUCOPHAGE) 500 MG tablet Take 1 tablet (500 mg total) by mouth daily. 90 tablet 0  . PROAIR HFA 108 (90 Base) MCG/ACT inhaler TAKE 1-2 PUFFS INHALE THREE TIMES A DAY (Patient taking differently: TAKE 1-2 PUFFS INHALE THREE TIMES A DAY AS NEEDED FOR SHORTNESS OF BREATH) 8.5 Inhaler 0  . ranitidine (ZANTAC) 300 MG tablet Take 1 tablet (300 mg total) by mouth daily. (Patient taking differently: Take 300 mg by mouth at bedtime. ) 90 tablet 0  . tamsulosin (FLOMAX) 0.4 MG CAPS capsule Take 2 capsules (0.8 mg total) by mouth daily. (Patient taking differently: Take 0.8 mg by mouth every evening. ) 180 capsule 1  . testosterone cypionate (DEPOTESTOTERONE CYPIONATE) 100 MG/ML injection Inject 100 mg into the  muscle every 14 (fourteen) days.     . traMADol (ULTRAM) 50 MG tablet TAKE 1/2-1 TAB BY MOUTH 4 TIMES A DAY AS NEEDED FOR PAIN (Patient taking differently: TAKE 1 TAB BY MOUTH DAILY) 120 tablet 0  . cetirizine (ZYRTEC) 10 MG tablet Take 1 tablet (10 mg total) by mouth daily as needed for allergies or rhinitis. 30 tablet 11  . fluticasone (FLONASE) 50 MCG/ACT nasal spray Place 2 sprays into the nose daily. 48 g 1  . glucose blood (ONE TOUCH ULTRA TEST) test strip Check daily 32 each 1  . lisinopril (PRINIVIL,ZESTRIL) 5 MG tablet TAKE 1 TABLET BY MOUTH EVERY DAY (Patient not taking: Reported on 05/12/2017) 90 tablet 1  . lisinopril (PRINIVIL,ZESTRIL) 5 MG tablet TAKE 1 TABLET BY MOUTH EVERY DAY (Patient not taking: Reported on 05/12/2017) 90 tablet 0  . methocarbamol (ROBAXIN) 500 MG tablet TAKE 1 TABLET BY MOUTH EVERY 8 HOURS AS NEEDED FOR MUSCLE SPASM (Patient not taking: Reported on 05/12/2017) 50 tablet 0  . sildenafil (REVATIO) 20 MG tablet Take 1 tablet (20 mg total) by mouth 3 (three) times daily as needed. (Patient not taking: Reported on 05/12/2017) 50 tablet 3  . testosterone cypionate (DEPOTESTOSTERONE CYPIONATE) 200 MG/ML injection Inject 1 mL (200 mg total) into the muscle once. 10 mL 0  . venlafaxine XR (EFFEXOR-XR) 75 MG 24 hr capsule TAKE 1 CAPSULE BY MOUTH DAILY WITH BREAKFAST  90 capsule 0    No results found for this or any previous visit (from the past 2 hour(s)). No results found.  Review of Systems  Constitutional: Negative.   HENT: Negative.   Respiratory: Negative.   Cardiovascular: Negative.   Gastrointestinal: Negative.   Genitourinary: Negative.   Musculoskeletal: Positive for back pain.  Skin: Negative.   Neurological: Positive for tingling.  Psychiatric/Behavioral: Negative.     There were no vitals taken for this visit. Physical Exam  Constitutional: He is oriented to person, place, and time. He appears well-developed. No distress.  HENT:  Head:  Normocephalic and atraumatic.  Eyes: EOM are normal. Pupils are equal, round, and reactive to light.  Neck: Normal range of motion.  Respiratory: No respiratory distress.  GI: He exhibits no distension.  Musculoskeletal: He exhibits tenderness.  Neurological: He is alert and oriented to person, place, and time.  Skin: Skin is warm and dry.  Psychiatric: He has a normal mood and affect.     Assessment/Plan 1. 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 ([place])   2. Pseudarthrosis following spinal  fusion   3. Painful orthopaedic hardware [facility])      We will proceed with Removal of left sacroiliac screw, explore fusion L4 to S1, Reinsert hardware L5-S1, left L5 nerve decompression, T11-T12 decompression, local and allograft and Vivigen bone graft as scheduled.  Surgical procedure along with possible risks and complications discussed.  All questions answered and wishes to proceed.    Benjiman Core, PA-C 05/23/2017, 6:36 AM

## 2017-05-23 NOTE — Interval H&P Note (Signed)
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 After consideration of risks, benefits and other options for treatment, the patient has consented to  Procedure(s): Removal of left sacroiliac screw, explore fusion L4 to S1, Reinsert hardware L5-S1, left L5 nerve decompression, T11-T12 decompression, local and allograft and Vivigen bone graft (N/A) as a surgical intervention .  The patient's history has been reviewed, patient examined, no change in status, stable for surgery.  I have reviewed the patient's chart and labs.  Questions were answered to the patient's satisfaction.     [name]

## 2017-05-23 NOTE — Progress Notes (Signed)
Orthopedic Tech Progress Note Patient Details:  Luis Brown 1946-06-07 [id number] Called [employer] for brace. [name], male   DOB: 10-Oct-1945, 71 y.o.   MRN: [id number]   Luis Brown 05/23/2017, 3:50 PM

## 2017-05-23 NOTE — Anesthesia Postprocedure Evaluation (Signed)
Anesthesia Post Note  Patient: Luis Brown  Procedure(s) Performed: Removal of left sacroiliac screw, explore fusion L4 to S1, Reinsert hardware L5-S1, left L5 nerve decompression, local bone, allograft and Vivigen bone graft (N/A Spine Lumbar)     Patient location during evaluation: PACU Anesthesia Type: General Level of consciousness: awake, awake and alert and oriented Pain management: pain level controlled Vital Signs Assessment: post-procedure vital signs reviewed and stable Respiratory status: spontaneous breathing, nonlabored ventilation and respiratory function stable Cardiovascular status: blood pressure returned to baseline Postop Assessment: no headache Anesthetic complications: no    Last Vitals:  Vitals:   05/23/17 1725 05/23/17 1751  BP: 118/72 127/70  Pulse: 92 89  Resp: 12 17  Temp: 37.1 C 36.8 C  SpO2: 98% 98%    Last Pain:  Vitals:   05/23/17 1751  TempSrc: Oral  PainSc:                  Diamond Jentz COKER

## 2017-05-23 NOTE — Op Note (Signed)
05/23/2017  2:38 PM  PATIENT:  Luis Brown  71 y.o. 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 Linna Caprice.     COMPLICATIONS:  None.  EBL: 900 CC  DRAINS: Foley to SD, Medium hemovac x1 left lower lumbar.      COMPONENTS:   Implant Name Type Inv. Item Serial No. Manufacturer Lot No. LRB No. Used  KIT INFUSE SMALL - [id number] Orthopedic Implant KIT INFUSE SMALL  [employer] [name] [id number] AAG N/A 1  BONE VIVIGEN FORMABLE 11CC - [id number] Bone Implant BONE VIVIGEN FORMABLE 11CC [id number] [facility]  N/A 1  SCREW VIPER 7X50MM - [id number] Screw SCREW VIPER 7X50MM  [employer]  N/A 1  SCREW VIPER 8X45MM - [id number] Screw SCREW VIPER 8X45MM  [employer]  N/A 3  CONNECTOR OPEN POLY LATERAL - [id number] Connector CONNECTOR OPEN POLY LATERAL  [employer]  N/A 2  ROD EXPEDIUM PREBENT 95MM - [id number] Rod ROD EXPEDIUM PREBENT 95MM  [employer]  N/A 1  ROD EXPEDIUM 5.5X300MM - [id number] Rod ROD EXPEDIUM 5.5X300MM  [employer]  N/A 1  SCREW SET SINGLE INNER - [id number] Screw SCREW SET SINGLE INNER  [employer]  N/A 5   CONNECTOR EXPEDIUM TI 55MM - [id number] Connector CONNECTOR EXPEDIUM TI 55MM  [employer]  N/A 5  FINDINGS: Left lateral recess stenosis L5-S1 affecting the left S1 nerve root, foramenal stenosis left L5 nerve root compression. Non union L5-S1 inter body fusion. Broken rods left at the upper aspect of the S1 fastener, right at the lower end of the right L5 fastener.Loosening of bilateral iliac wing screws and right S1 screw.  SPECIMEN:  No Specimen  DISPOSITION OF SPECIMEN:  N/A   The T11-12 laminectomy was not done due to error in level of most significant stenosis level seen of the preop study, length of surgery and having found significant cause for this patients left buttock and left leg pain.   PROCEDURE:The patient was met in the holding area, and the appropriate lumbar level Left L2-3 and L4-5 identified and marked with an x and my initials.The patient was then transported to OR. The patient was then placed under general anesthesia without difficulty.The patient received appropriate preoperative antibiotic prophylaxis ancef. Nursing staff inserted a Foley catheter under sterile conditions. He was then turned to a prone position [name] spine table was used for this case. All pressure points were well padded PAS stocking applied bilateral lower extremity to prevent DVT. Standard prep DuraPrep solution. Draped in the usual manner. Time-out procedure was called and correct .  The old incision scar was ellipsed L3 to S1 and carried superiorly an additional 2 levels to the L2 spinous process and the incision carried inferiorly an  additional one level to the S2 spinous process.  Bovie electric cautery was used to control bleeding and carefully dissection was carried down along the lateral aspects of the spinous process of L2 to S2. Cobb then used to carefully elevate the paralumbar muscle and the incision in the midline was carried to to the level of the base of the residual spinous  processes. The laminotomy area extending from the base of the spinous process of L2 to the superior aspect of L5 was carefully exposed and its edges debrided the scar tissue using a large curette.The bilateral pedicle screw and rod construct at the L5-S1 level and bilateral iliac wing screws was exposed. Viper retractors were placed . The Hardware presence identified the levels bilateral iliac wings, the S1 pedicle screws. The hardware identified as 6.44m rods and Depuy Synthes Viper pedicle screws and rods with iliac wing screws. The crosslink at the L3-4 level was removed by removing the central screw and the then loosening the right and left rod fasteners. The bilateral  iliac wing screws and S1 bilateral pedicle screw fastener caps were then removed. The rods then removed from the iliac wing screws to the left upper S1 fastener and right lower L5 fastener  Level of rod breakage. The pedicle screws right S1 and left S1  And the iliac wing screws were then removed. A separate stab incision made on the left side just over the left PSIS medially to  Remove the left S1 screw. The left lower end of the 6.5 rod was Prominent and overlying the left L5 pedicle area and the area for  Expected left L5-S1 facetectomy. A bolt cutter would not fit in the area along the left lower posterolateral spine so that the area was carefully irrigated and a sponge placed ventral to the lower  Left exposed rod. The carbide metal cutting drill then used to  Divide the left rod about 3-451mbelow the inferior margin of the left L4 pedicle screw fastener. All Ti metal shavings were removed and the sponge collected most of the shavings. The margins of the left L5-S1 posterior interlaminar area then defined and the left lamina and the spinous process of L5 inferiorly was resected with a rounger.   Osteotome was then used to resect the inferior articular process of left L5-S1 and a central laminectomy performed using osteotome, currettes  and kerrisons removing with 3 mm Kerrison and Penfield 4 and pituitary rongeur. Carefully the medial aspect of the superior articular process of S1 was freed up of scar tissue off of the adjacent thecal sac. Osteotome used to perform osteotomy of the superior articular process on the left side at S1 decompressing the lateral recess. This was done using an osteotome and then using a 3 mm Kerrison and curettes residual bone remaining in the lateral recess was resected using 17m71merrisons. Loupe magnification and headlight were used for this portion of the procedure. 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 The left L5 nerve root well decompressed. Hockey-stick nerve probe was it will be passed out the left L5 neuroforamen and identifying the superior aspect of the S1 pedicle then osteotome used to resect the superior articular process of S1 transversely at the superior level of the S1 pedicle. This allowed for a wide decompression left L5 neuroforamen. L5 nerve root and lateral aspect of the thecal sac at the L5-S1 level was  then able to be mobilized with [facility] 4 and the disc space at the L5-S1 level identified and the nerve structures retracted with Derricho retractor. 15 blade scalpel used to incise the disc the left side. L5-S1 discectomy on the left side carried out with pituitary ronguers.A large amount of bone spur was found over the posterior superior end plate of S1 was found to extend upwards over the posterior aspect of the L5-S1 disc space, a quarter inch osteotome used to resect the posterior superior lip of the S1 endplate. Similarly the central and left lateral portions of the L5-S1 disc  were resected.The left L5 and S1 nerve roots were well decompressed with the left L5 previous foraminotomy. Following debridement of degenerative disc material from the left side  L5-S1 disc, the disc space was then prepared using straight curettes and ring curettes. Degenerated disc as well as a cartilage endplates were debrided from the disc place using pituitary rongeurs,currettes. The disc space examined demonstrating good bleeding endplate bone surfaces. Bone graft that was harvested from the facet was placed into the intervertebral disc space layer with infuse BMP within cut strips of the gel foam matrix further local bone graft was then impacted into the intervertebral disc space at L5-S1  first placing within the disc space using a forceps then impacted with a bone graft insertion small rectangular impactor. After performing this 3 times the intervertebral disc space was packed with bone graft, inspection of the spinal canal demonstrated no bone graft within the spinal canal both the L5 and S1 nerve roots appeared to exit without further compression. A layer of duraseal was then placed sealing the posterior disc opening on the left side. Normal was then used to make an entry point into the intersection of the lateral aspect of the L5 pedicle with left L5 transverse process observed on C-arm fluoroscopy to be in good position alignment with the pedicle of L5 observed on lateral view. Handle held pedicle probe was then used to make an opening into the central portions of the pedicle of L5 on the left side. C-arm fluoroscopy used to verify the position alignment of the pedicle probe depth at 50 mm. Decortication of the transverse process of the L5 was then carried out using a high-speed bur. Local bone graft was applied to the area between the transverse process of L5 and the posterior lateral L4 transverse process. Ball-tipped probe was then used to probe the channel placed on the left side at L5 pedicle opening that was identified with the resection of bone over lying the left L5 foramen and inferior L5 pedicle, the L5 nerve root and the lateral thecal sac showed no proximity to the L5  pedicle screw opening and the opening was probed with a ball tipped probe again showing no broaching cortex. The 50 mm x 43m  pedicle screw was then inserted at the left L5 level in the appropriate degree of convergence and lordosis. Attention then turned to the left S1 pedicle screw. The old entry point into the left S1 pedicle identified using the awl at the converging transverse process and lateral aspect of the pedicle of L2. C-arm used to verify the alignment for the L2 pedicle. The pedicle was then probed to a depth of 45 mm using the ball tipped pedicle probe. Decortication of the alae was carried out using a high-speed bur and bone graft applied. Check made with ball-tip probe to ensure no penetration of the cortex of the S1 pedicle.Decortication of the transverse  process of the L5 was then carried out using a high-speed bur. Local bone graft was applied to the area between the transverse process of L5 and the posterior lateral S1 transverse process. A 8 mm x 45 mm Depuy mPACT viper corticotrajectory screw was then inserted degree of lordosis and in the correct degree of convergence. The C-arm was used to verify the position alignment of pedicle screw. A 105 mm rod was then carefully inserted into the fasteners extending from L5 and S1 and sleeves placed on the left side rod between the retained pedicle screw fasteners at L2-3 and L3-4 on the left side. The fastener caps at left S1 and L5 level was then placed. The sleeves attached to the left 6.84m rod and torque tightened to 80 foot lbs. Attention then turned to the placement of the pedicle screw on the right side at S1 this was done similar to the right side. The previous initial entry point verified with C-arm fluoroscopy then a hand-held pedicle probe used to probe the pedicle channel, checked with a ball-tip probe and verify no broaching of cortex. Pedicle probe of S1 noted on C-arm fluoroscopy to be in the correct degree of lordosis centered within  the pedicle and a length of about 45 mm screw. Tapping was then performed using is 7 mm tap again checking the opening within the pedicle with a pedicle probe to ensure no broaching cortex. 826mx 4566medicle screw Depuy Viper corticotrajectory type was then inserted into the right S1 pedicle after first decorticating the transverse process of L5. Local bone graft was then inserted between the transverse process of L 5 on the right side to the lower aspect of the posterior lateral sacral alae. S1 Pedicle screw inserted in excellent position alignment observed with C-arm fluoroscopy. Attention then turned to placement of a  95 mm rod was then inserted into the fasteners extending from S1 on the right side. The S1 fastener required an offset connector to allow for medial placement of the rod along the lower part of the right 6.5 rod.  Fastener caps then applied to the S1 pediclescrew and offset fasteners. Three sleeve were used on the right side to fasten the rod from S1 to the retained right lower rod using sleeves place between L2-3, L3-4 and L4-5. The right sleeve caps tighted fastening the sleeves on the right side to the retained rod and then the caps to the 4.5 rod medially place and tightened to 80 foot lbs. The S1 off set cap were tightened today to 80 foot-pounds with compression obtained between the fasteners at S1 and L5. Irrigation was carried out using copious amounts of irrigant solution. Thrombin-soaked Gelfoam were placed for hemostasis was then carefully removed. Permanent C-arm images were obtained in AP oblique and lateral positions for documentation purposes. They showed the pedicle screws rods to be in good position alignment from L2-L5.  Returning to the left sided discectomy site care was taken to ensure that all disc herniation material was debrided from the subligamentous area over the posterior aspect of the L2 vertebral body and at the posterior L2-3 disc space. Adequate hemostasis then with  Gelfoam thrombin soaked cottonoids these were then removed when hemostasis had been obtained. Copious amount of irrigation was then carried out and devitalized tissue debrided.  One gram of powder vancomycin was the sprinkled over the entire incision after bone graft and vivigan allograft was placed along the right posterio L5-S1 level. There was 900 cc blood loss 1600  cc of irrigation.. A medium Hemovac drain was placed in the left lower lumbar spine and the midline incision away from neural structures the deep paralumbar muscles with her approximated with 1 Vicryl sutures, the lumbodorsal fascia approximated with 0 and 1 Vicryl sutures. Subcutaneous layers were then approximated with interrupted 0 and 2-0 Vicryl sutures , skin closed with a running subcuticular 4-0 Vicryl suture. The bone was then applied. Mepilex bandage was then applied. The exiting Hemovac drain site was through the stab incision made to place the left S1 pedicle screw. This site was closed with subcutaneous layes approximated with 0 and 2-0 vicryl sutures and carefully bandage. All instrument and sponge counts were correct. Patient was then returned to the supine position reactivated extubated and returned to the recovery room in satisfactory condition Benjiman Core, PA-C perform the duties of assistant surgeon during this case. He was present from the beginning of the case to the end of the case assisting in transfer the patient from his stretcher to the OR table and back to the stretcher at the end of the case. Assisted in careful retraction and suction of the laminectomy site delicate neural structures operating under the operating room microscope. He performed closure of the incision from the fascia to the skin applying the dressing.      [name]  05/23/2017, 2:38 PM

## 2017-05-24 LAB — CBC
HCT: 35.2 % — ABNORMAL LOW (ref 39.0–52.0)
Hemoglobin: 11.6 g/dL — ABNORMAL LOW (ref 13.0–17.0)
MCH: 33 pg (ref 26.0–34.0)
MCHC: 33 g/dL (ref 30.0–36.0)
MCV: 100.3 fL — ABNORMAL HIGH (ref 78.0–100.0)
Platelets: 268 10*3/uL (ref 150–400)
RBC: 3.51 MIL/uL — ABNORMAL LOW (ref 4.22–5.81)
RDW: 13.7 % (ref 11.5–15.5)
WBC: 10.5 10*3/uL (ref 4.0–10.5)

## 2017-05-24 LAB — BASIC METABOLIC PANEL
Anion gap: 7 (ref 5–15)
BUN: 18 mg/dL (ref 6–20)
CO2: 28 mmol/L (ref 22–32)
Calcium: 7.9 mg/dL — ABNORMAL LOW (ref 8.9–10.3)
Chloride: 102 mmol/L (ref 101–111)
Creatinine, Ser: 1.06 mg/dL (ref 0.61–1.24)
GFR calc Af Amer: >60 mL/min (ref >60)
GFR calc non Af Amer: >60 mL/min (ref >60)
Glucose, Bld: 123 mg/dL — ABNORMAL HIGH (ref 65–99)
Potassium: 3.7 mmol/L (ref 3.5–5.1)
Sodium: 137 mmol/L (ref 135–145)

## 2017-05-24 MED ORDER — PANTOPRAZOLE SODIUM 40 MG PO TBEC
40.0000 mg | DELAYED_RELEASE_TABLET | Freq: Every day | ORAL | Status: DC
  Administered 2017-05-24: 40 mg via ORAL
  Filled 2017-05-24: qty 1

## 2017-05-24 NOTE — Care Management Note (Signed)
Case Management Note  Patient Details  Name: Luis Brown MRN: [id number] Date of Birth: Mar 03, 1946  Subjective/Objective:   Pt underwent:  Removal of left sacroiliac screw, explore fusion L4 to S1, Reinsert hardware L5-S1, left L5 nerve decompression, local bone, allograft and Vivigen bone graft. He is from home with spouse.                   Action/Plan: No f/u per PT. Awaiting OT recs. MD has placed orders for DME. CM following for d/c needs, physician orders.   Expected Discharge Date:                  Expected Discharge Plan:  Home/Self Care  In-House Referral:     Discharge planning Services  CM Consult  Post Acute Care Choice:  Durable Medical Equipment Choice offered to:     DME Arranged:    DME Agency:     HH Arranged:    [place] Agency:     Status of Service:  In process, will continue to follow  If discussed at Long Length of Stay Meetings, dates discussed:    Additional Comments:  Pollie Friar, RN 05/24/2017, 2:17 PM

## 2017-05-24 NOTE — Evaluation (Signed)
Physical Therapy Evaluation Patient Details Name: Luis Brown MRN: [id number] DOB: 10-21-1945 Today's Date: 05/24/2017   History of Present Illness  71 y.o. male presents to hospital with back and leg pain. Pt is s/p L4-S1 fusion, L5 and T11/12 decompression, and re-fixation of dislodged hardware from previous back surgeries (1994, 2012, and 2015). PMH significant of DM, DDD, HTN, anx/dep, and multiple back surgeries.  Clinical Impression  Pt presents with impaired mobility s/p back surgical procedure above. Pt is able to perform bed mobility with mod independence and transfers, ambulation, and stairs with min guard using rolling 2-wheeled walker. Pt ambulated 150-ft and ascended/descended 2 stairs during session without rest breaks. Pt's prior level of function is independent home and community ambulation. Educated pt on precautions following back surgery. Pt will benefit from skilled PT acutely in order to improve functional independence and work towards safe return to prior level of function.     Follow Up Recommendations No PT follow up    Equipment Recommendations  Rolling walker with 5" wheels    Recommendations for Other Services       Precautions / Restrictions Precautions Precautions: Back Required Braces or Orthoses: Spinal Brace Spinal Brace: Applied in sitting position(Aspen L-S brace - left hip drop lock and hinge) Restrictions Weight Bearing Restrictions: No      Mobility  Bed Mobility Overal bed mobility: Modified Independent Bed Mobility: Supine to Sit     Supine to sit: Modified independent (Device/Increase time);HOB elevated     General bed mobility comments: Pt used elevated HOB and railing to transfer from supine to sit at EOB. Will address log rolling next session.   Transfers Overall transfer level: Needs assistance Equipment used: Rolling walker (2 wheeled) Transfers: Sit to/from Stand Sit to Stand: Min guard         General transfer comment:  for safety  Ambulation/Gait Ambulation/Gait assistance: Min guard Ambulation Distance (Feet): 150 Feet Assistive device: Rolling walker (2 wheeled) Gait Pattern/deviations: WFL(Within Functional Limits);Decreased stride length;Step-through pattern;Trunk flexed     General Gait Details: Pt ambulated with left hip hike secondary to restrictive brace, limiting left hip flexion. Pt ambulates with of Rt. foot pronation.  Stairs Stairs: Yes Stairs assistance: Min guard Stair Management: Two rails;Step to pattern;Forwards Number of Stairs: 2 General stair comments: Pt used RLE to intiate step up and LLE to initiate step-down. Pt a little impulsive in initiating stair climbing without direction from PT.   Wheelchair Mobility    Modified Rankin (Stroke Patients Only)       Balance Overall balance assessment: Needs assistance Sitting-balance support: Feet supported;No upper extremity supported Sitting balance-Leahy Scale: Normal Sitting balance - Comments: Pt demonstrates dynamic sitting balance, using both UEs to aid PT in donning back brace.   Standing balance support: Bilateral upper extremity supported;During functional activity Standing balance-Leahy Scale: Poor Standing balance comment: Pt used external support for standing balance (either RW or stair handrails).                             Pertinent Vitals/Pain Pain Assessment: No/denies pain    Home Living Family/patient expects to be discharged to:: Private residence Living Arrangements: Spouse/significant other Available Help at Discharge: Family Type of Home: House Home Access: Stairs to enter Entrance Stairs-Rails: Can reach both Entrance Stairs-Number of Steps: 2 Home Layout: One level        Prior Function Level of Independence: Independent  Comments: Pt reports no trouble with ADLs or functional activities prior to hospital visit. No use of assistive device.      Hand Dominance         Extremity/Trunk Assessment   Upper Extremity Assessment Upper Extremity Assessment: Defer to OT evaluation    Lower Extremity Assessment Lower Extremity Assessment: Overall WFL for tasks assessed    Cervical / Trunk Assessment Cervical / Trunk Assessment: Normal  Communication   Communication: No difficulties  Cognition Arousal/Alertness: Awake/alert Behavior During Therapy: WFL for tasks assessed/performed;Impulsive Overall Cognitive Status: Within Functional Limits for tasks assessed                                 General Comments: Pt initiated activities (climbing stairs and supine to sit at EOB) without waiting for PT instructions.      General Comments      Exercises     Assessment/Plan    PT Assessment Patient needs continued PT services  PT Problem List Decreased strength;Decreased mobility;Decreased balance;Decreased safety awareness;Decreased knowledge of precautions       PT Treatment Interventions DME instruction;Functional mobility training;Balance training;Patient/family education;Gait training;Therapeutic activities;Neuromuscular re-education;Stair training;Therapeutic exercise    PT Goals (Current goals can be found in the Care Plan section)  Acute Rehab PT Goals Patient Stated Goal: return home PT Goal Formulation: With patient Time For Goal Achievement: 06/07/17 Potential to Achieve Goals: Good    Frequency Min 5X/week   Barriers to discharge        Co-evaluation               AM-PAC PT "6 Clicks" Daily Activity  Outcome Measure Difficulty turning over in bed (including adjusting bedclothes, sheets and blankets)?: None Difficulty moving from lying on back to sitting on the side of the bed? : None Difficulty sitting down on and standing up from a chair with arms (e.g., wheelchair, bedside commode, etc,.)?: A Little Help needed moving to and from a bed to chair (including a wheelchair)?: A Little Help needed walking  in hospital room?: A Little Help needed climbing 3-5 steps with a railing? : A Little 6 Click Score: 20    End of Session Equipment Utilized During Treatment: Gait belt;Back brace Activity Tolerance: Patient tolerated treatment well Patient left: in chair;with chair alarm set;with call bell/phone within reach Nurse Communication: Mobility status PT Visit Diagnosis: Difficulty in walking, not elsewhere classified (R26.2);Other abnormalities of gait and mobility (R26.89)    Time: 6789-3810 PT Time Calculation (min) (ACUTE ONLY): 26 min   Charges:   PT Evaluation $PT Eval Low Complexity: 1 Low PT Treatments $Gait Training: 8-22 mins   PT G Codes:        Judee Clara, SPT  Judee Clara 05/24/2017, 1:44 PM

## 2017-05-24 NOTE — Progress Notes (Signed)
     Subjective: 1 Day Post-Op Procedure(s) (LRB): Removal of left sacroiliac screw, explore fusion L4 to S1, Reinsert hardware L5-S1, left L5 nerve decompression, local bone, allograft and Vivigen bone graft (N/A)awake alert and oriented x 4. Pain is back, left leg pain is better.  Patient reports pain as moderate.    Objective:   VITALS:  Temp:  [98 F (36.7 C)-98.6 F (37 C)] 98 F (36.7 C) (11/07 1430) Pulse Rate:  [70-89] 78 (11/07 1430) Resp:  [17-18] 18 (11/07 1430) BP: (114-138)/(46-70) 128/60 (11/07 1430) SpO2:  [97 %-99 %] 98 % (11/07 1430)  Neurologically intact Neurovascular intact Intact pulses distally Dorsiflexion/Plantar flexion intact Incision: moderate drainage   LABS Recent Labs    05/24/17 0408  HGB 11.6*  WBC 10.5  PLT 268   Recent Labs    05/24/17 0408  NA 137  K 3.7  CL 102  CO2 28  BUN 18  CREATININE 1.06  GLUCOSE 123*   No results for input(s): LABPT, INR in the last 72 hours.   Assessment/Plan: 1 Day Post-Op Procedure(s) (LRB): Removal of left sacroiliac screw, explore fusion L4 to S1, Reinsert hardware L5-S1, left L5 nerve decompression, local bone, allograft and Vivigen bone graft (N/A)  Advance diet Up with therapy D/C IV fluids  [name] 05/24/2017, 5:50 PMPatient ID: Luis Brown, male   DOB: June 04, 1946, 71 y.o.   MRN: [id number]

## 2017-05-24 NOTE — Evaluation (Signed)
Occupational Therapy Evaluation and Discharge Patient Details Name: Luis Brown MRN: [id number] DOB: 1946/03/02 Today's Date: 05/24/2017    History of Present Illness 71 y.o. male presents to hospital with back and leg pain. Pt is s/p L4-S1 fusion, L5 and T11/12 decompression, and re-fixation of dislodged hardware from previous back surgeries (1994, 2012, and 2015). PMH significant of DM, DDD, HTN, anx/dep, and multiple back surgeries.   Clinical Impression   PTA Pt independent in ADL and mobility. Back handout provided and reviewed adls in detail. Pt educated on: clothing between brace, never sleep in brace (practiced putting brace on and off with Pt and wife), set an alarm at night for medication, avoid sitting for long periods of time, correct bed positioning for sleeping, correct sequence for bed mobility, avoiding lifting more than 5 pounds (gallon of milk) and never wash directly over incision. Pt has AE from previous back surgeries, reviewed use of them for LB ADL. All education is complete and patient indicates understanding. No further OT needs at this time. Thank you for the opportunity to serve this patient.      Follow Up Recommendations  No OT follow up    Equipment Recommendations  None recommended by OT(Pt has appropriate DME)    Recommendations for Other Services       Precautions / Restrictions Precautions Precautions: Back Precaution Booklet Issued: No Precaution Comments: reviewed precautions with Pt, Pt able to recall 3/3 Required Braces or Orthoses: Spinal Brace Spinal Brace: Applied in sitting position([place]en L-S brace - left hip drop lock and hinge) Restrictions Weight Bearing Restrictions: No      Mobility Bed Mobility Overal bed mobility: Modified Independent Bed Mobility: Supine to Sit     Supine to sit: Modified independent (Device/Increase time);HOB elevated     General bed mobility comments: Pt used elevated HOB and railing to transfer from  supine to sit at EOB. Will address log rolling next session.   Transfers Overall transfer level: Needs assistance Equipment used: None Transfers: Sit to/from Stand Sit to Stand: Min guard         General transfer comment: for safety    Balance Overall balance assessment: Needs assistance Sitting-balance support: Feet supported;No upper extremity supported Sitting balance-Leahy Scale: Normal Sitting balance - Comments: Pt demonstrates dynamic sitting balance, using both UEs to aid PT in donning back brace.   Standing balance support: No upper extremity supported;During functional activity Standing balance-Leahy Scale: Fair Standing balance comment: OT doubts complaince with use of RW once outside hospital setting                           ADL either performed or assessed with clinical judgement   ADL Overall ADL's : Needs assistance/impaired Eating/Feeding: Modified independent;Sitting   Grooming: Min guard;Standing Grooming Details (indicate cue type and reason): sink level Upper Body Bathing: Set up;Sitting;With adaptive equipment   Lower Body Bathing: Set up;With adaptive equipment;Sit to/from stand   Upper Body Dressing : Maximal assistance;Sitting;Standing Upper Body Dressing Details (indicate cue type and reason): to don brace Lower Body Dressing: Moderate assistance;With caregiver independent assisting;Sit to/from stand Lower Body Dressing Details (indicate cue type and reason): wife able to assist Toilet Transfer: Min guard;Ambulation Toilet Transfer Details (indicate cue type and reason): in brace; no RW Toileting- Clothing Manipulation and Hygiene: Min guard;Sit to/from stand       Functional mobility during ADLs: Min guard General ADL Comments: Pt educated in [place] (familiar from previous  sx), compensatory strategies reviewed in full     Vision Patient Visual Report: No change from baseline Vision Assessment?: No apparent visual deficits      Perception     Praxis      Pertinent Vitals/Pain Pain Assessment: Faces Faces Pain Scale: Hurts little more Pain Location: incision site Pain Descriptors / Indicators: Discomfort;Grimacing;Sore;Tingling Pain Intervention(s): Monitored during session;Repositioned     Hand Dominance     Extremity/Trunk Assessment Upper Extremity Assessment Upper Extremity Assessment: Overall WFL for tasks assessed   Lower Extremity Assessment Lower Extremity Assessment: Overall WFL for tasks assessed(reports improved numbness in LLE)   Cervical / Trunk Assessment Cervical / Trunk Assessment: Other exceptions Cervical / Trunk Exceptions: s/p spinal surgery   Communication Communication Communication: No difficulties   Cognition Arousal/Alertness: Awake/alert Behavior During Therapy: WFL for tasks assessed/performed;Impulsive Overall Cognitive Status: Within Functional Limits for tasks assessed                                 General Comments: eager to demonstrate compentence and independence - sometimes jumps ahead and "impulsive"    General Comments  Wife present for session and education    Exercises     Shoulder Instructions      Home Living Family/patient expects to be discharged to:: Private residence Living Arrangements: Spouse/significant other Available Help at Discharge: Family Type of Home: House Home Access: Stairs to enter Technical brewer of Steps: 2 Entrance Stairs-Rails: Can reach both Home Layout: One level("man cave" is down 2 steps)     Bathroom Shower/Tub: Teacher, early years/pre: Handicapped height Bathroom Accessibility: Yes How Accessible: Accessible via walker Home Equipment: [place] - 2 wheels;Cane - single point;Bedside commode;Shower seat;Adaptive equipment;Hand held shower head Adaptive Equipment: Reacher;Sock aid;Long-handled shoe horn;Long-handled sponge Additional Comments: Wife is around until Luis Brown - grown sons  that can assist as needed      Prior Functioning/Environment Level of Independence: Independent        Comments: Pt reports no trouble with ADLs or functional activities prior to hospital visit. No use of assistive device.         OT Problem List: Decreased range of motion;Decreased activity tolerance;Impaired balance (sitting and/or standing);Pain      OT Treatment/Interventions:      OT Goals(Current goals can be found in the care plan section) Acute Rehab OT Goals Patient Stated Goal: return home OT Goal Formulation: With patient/family Time For Goal Achievement: 06/07/17 Potential to Achieve Goals: Good  OT Frequency:     Barriers to D/C:            Co-evaluation              AM-PAC PT "6 Clicks" Daily Activity     Outcome Measure Help from another person eating meals?: None Help from another person taking care of personal grooming?: A Little Help from another person toileting, which includes using toliet, bedpan, or urinal?: A Little Help from another person bathing (including washing, rinsing, drying)?: A Little Help from another person to put on and taking off regular upper body clothing?: A Lot Help from another person to put on and taking off regular lower body clothing?: A Lot 6 Click Score: 17   End of Session Equipment Utilized During Treatment: Gait belt;Back brace Nurse Communication: Mobility status;Other (comment)(on the toilet- RN aware)  Activity Tolerance: Patient tolerated treatment well Patient left: Other (comment)(on bathroom toilet attempting to have  BM)  OT Visit Diagnosis: Unsteadiness on feet (R26.81);Other abnormalities of gait and mobility (R26.89);History of falling (Z91.81);Pain Pain - part of body: (back)                Time: 5809-9833 OT Time Calculation (min): 28 min Charges:  OT General Charges $OT Visit: 1 Visit OT Evaluation $OT Eval Moderate Complexity: 1 Mod OT Treatments $Self Care/Home Management : 8-22  mins G-Codes:     Hulda Humphrey OTR/L [place] 05/24/2017, 3:28 PM

## 2017-05-25 ENCOUNTER — Other Ambulatory Visit: Payer: Self-pay

## 2017-05-25 ENCOUNTER — Telehealth (INDEPENDENT_AMBULATORY_CARE_PROVIDER_SITE_OTHER): Payer: Self-pay | Admitting: Specialist

## 2017-05-25 MED ORDER — DOCUSATE SODIUM 100 MG PO CAPS: 100.0000 mg | ORAL_CAPSULE | Freq: Two times a day (BID) | ORAL | 0 refills | Status: DC

## 2017-05-25 MED ORDER — HYDROCODONE-ACETAMINOPHEN 7.5-325 MG PO TABS: 1.0000 | ORAL_TABLET | Freq: Four times a day (QID) | ORAL | 0 refills | Status: DC

## 2017-05-25 MED ORDER — GABAPENTIN 300 MG PO CAPS: 300.0000 mg | ORAL_CAPSULE | Freq: Three times a day (TID) | ORAL | 1 refills | Status: DC

## 2017-05-25 MED ORDER — METHOCARBAMOL 500 MG PO TABS: 500.0000 mg | ORAL_TABLET | Freq: Four times a day (QID) | ORAL | 1 refills | Status: DC | PRN

## 2017-05-25 NOTE — Care Management Note (Signed)
Case Management Note  Patient Details  Name: Luis Brown MRN: [id number] Date of Birth: Mar 10, 1946  Subjective/Objective:                    Action/Plan: Pt discharging home with self care. Pt with orders for 3 in 1 and walker. Pt states he has a 3 in 1 but needs a walker. Brad with [facility] DME notified and will deliver to the room.  Wife to provide transportation home.   Expected Discharge Date:  05/25/17               Expected Discharge Plan:  Home/Self Care  In-House Referral:     Discharge planning Services  CM Consult  Post Acute Care Choice:  Durable Medical Equipment Choice offered to:  Patient, Spouse  DME Arranged:  Walker rolling DME Agency:  [place]:    [place]:     Status of Service:  Completed, signed off  If discussed at [place] of Stay Meetings, dates discussed:    Additional Comments:  Pollie Friar, RN 05/25/2017, 9:07 AM

## 2017-05-25 NOTE — Progress Notes (Signed)
Physical Therapy Treatment Patient Details Name: Luis Brown MRN: [id number] DOB: 10/20/1945 Today's Date: 05/25/2017    History of Present Illness 71 y.o. male presents to hospital with back and leg pain. Pt is s/p L4-S1 fusion, L5 and T11/12 decompression, and re-fixation of dislodged hardware from previous back surgeries (1994, 2012, and 2015). PMH significant of DM, DDD, HTN, anx/dep, and multiple back surgeries.    PT Comments    Reviewed LSO donning and positioning; pt and wife beginning to don brace on PT arrival; reviewed brace position, strap placement,  & importance of compliance with brace; pt and wife verbalize understanding   Follow Up Recommendations  Outpatient PT;DC plan and follow up therapy as arranged by surgeon(would benefit from OPPT )     Equipment Recommendations  Rolling walker with 5" wheels    Recommendations for Other Services       Precautions / Restrictions Precautions Precautions: Back Precaution Booklet Issued: No Precaution Comments: reviewed precautions with Pt, Pt able to recall 3/3 Required Braces or Orthoses: Spinal Brace Spinal Brace: Applied in sitting position(Aspen LSO with L hip extension) Restrictions Weight Bearing Restrictions: No    Mobility  Bed Mobility               General bed mobility comments: pt on EOB on arrival  Transfers Overall transfer level: Needs assistance Equipment used: None Transfers: Sit to/from Stand Sit to Stand: Min guard         General transfer comment: for safety, required UE support and min/min-guard for balance  Ambulation/Gait Ambulation/Gait assistance: Min assist;Min guard Ambulation Distance (Feet): 120 Feet Assistive device: 1 person hand held assist Gait Pattern/deviations: Decreased stride length;Step-through pattern;Trunk flexed     General Gait Details: amb with right foot in supination/WBing lateral foot, reports baseline bil foot numbness prior to surgery, able to  partially correct with cues; requires HHA for balance throughout distance   Stairs            Wheelchair Mobility    Modified Rankin (Stroke Patients Only)       Balance   Sitting-balance support: Feet supported;No upper extremity supported Sitting balance-Leahy Scale: Normal Sitting balance - Comments: Pt demonstrates dynamic sitting balance, using both UEs to aid PT in donning back brace.   Standing balance support: No upper extremity supported;During functional activity Standing balance-Leahy Scale: Fair Standing balance comment: pt reports he does not use RW in the house; encouraged pt to use until he is stronger--he denies "furniture walking" but requries HHA adn attempts to hold rail in hallway, touch door frame, etc                            Cognition Arousal/Alertness: Awake/alert Behavior During Therapy: [facility] for tasks assessed/performed;Impulsive Overall Cognitive Status: Within Functional Limits for tasks assessed                                 General Comments: pt is eager to demonstrate independence, mildly impulsive at times, gets up quickly and is unsteady, grabs hold of student nurse      Exercises      General Comments        Pertinent Vitals/Pain Pain Assessment: No/denies pain    Home Living                      Prior Function  PT Goals (current goals can now be found in the care plan section) Acute Rehab PT Goals Patient Stated Goal: return home PT Goal Formulation: With patient Time For Goal Achievement: 06/07/17 Potential to Achieve Goals: Good Progress towards PT goals: Progressing toward goals    Frequency    Min 5X/week      PT Plan Current plan remains appropriate    Co-evaluation              AM-PAC PT "6 Clicks" Daily Activity  Outcome Measure  Difficulty turning over in bed (including adjusting bedclothes, sheets and blankets)?: None Difficulty moving from lying  on back to sitting on the side of the bed? : None Difficulty sitting down on and standing up from a chair with arms (e.g., wheelchair, bedside commode, etc,.)?: A Little Help needed moving to and from a bed to chair (including a wheelchair)?: A Little Help needed walking in hospital room?: A Little Help needed climbing 3-5 steps with a railing? : A Little 6 Click Score: 20    End of Session Equipment Utilized During Treatment: Back brace Activity Tolerance: Patient tolerated treatment well Patient left: Other (comment)(transport chair --nursing students taking pt out )   PT Visit Diagnosis: Difficulty in walking, not elsewhere classified (R26.2);Other abnormalities of gait and mobility (R26.89)     Time: 1057-1106 PT Time Calculation (min) (ACUTE ONLY): 9 min  Charges:  $Gait Training: 8-22 mins                    G CodesKenyon Ana, PT Pager: [phone] 05/25/2017    [facility] 05/25/2017, 11:42 AM

## 2017-05-25 NOTE — Telephone Encounter (Signed)
Patient called to get a 2 week post op appointment, but Dr. Louanne Skye does not have anything until the middle of December.  Also when he was discharged from the hospital, the instructions said that he needed to wear his brace when going to the bathroom.  He stated he really can't do that and be "successful".  CB#[phone].  Thank you.

## 2017-05-25 NOTE — Progress Notes (Signed)
Pt discharged at this time.  Verbalizes understanding of all discharge instructions, including brace application, restrictions, medications, prescriptions.  Pt has all belongings with him including cell phone and charger. Discharged home via car with spouse.

## 2017-05-25 NOTE — Consult Note (Signed)
            [facility] CM Primary Care Navigator  05/25/2017  MARKEVIOUS EHMKE 1945-11-25 [id number]   Went to see patient at the bedside to identify possible discharge needs but he was discharged per staff report. Patient was discharged home today.  Primary care provider's office is listed as doing transition of care (TOC).   For additional questions please contact:  Edwena Felty A. Verona Hartshorn, BSN, RN-BC [facility] PRIMARY CARE Navigator Cell: [phone]

## 2017-05-25 NOTE — Progress Notes (Signed)
     Subjective: 2 Days Post-Op Procedure(s) (LRB): Removal of left sacroiliac screw, explore fusion L4 to S1, Reinsert hardware L5-S1, left L5 nerve decompression, local bone, allograft and Vivigen bone graft (N/A)Awake, alert and oriented x 4. It's not that  Bad, I have more sensation in my leg than I did preop.  Patient reports pain as moderate.    Objective:   VITALS:  Temp:  [98 F (36.7 C)-99 F (37.2 C)] 99 F (37.2 C) (11/08 0431) Pulse Rate:  [77-85] 77 (11/08 0431) Resp:  [18] 18 (11/08 0431) BP: (115-128)/(51-64) 115/51 (11/08 0431) SpO2:  [96 %-99 %] 96 % (11/08 0431)  Neurologically intact ABD soft Neurovascular intact Sensation intact distally Intact pulses distally Dorsiflexion/Plantar flexion intact Incision: scant drainage Dressing changed.    LABS Recent Labs    05/24/17 0408  HGB 11.6*  WBC 10.5  PLT 268   Recent Labs    05/24/17 0408  NA 137  K 3.7  CL 102  CO2 28  BUN 18  CREATININE 1.06  GLUCOSE 123*   No results for input(s): LABPT, INR in the last 72 hours.   Assessment/Plan: 2 Days Post-Op Procedure(s) (LRB): Removal of left sacroiliac screw, explore fusion L4 to S1, Reinsert hardware L5-S1, left L5 nerve decompression, local bone, allograft and Vivigen bone graft (N/A)  Advance diet Up with therapy D/C IV fluids Plan for discharge tomorrow Discharge home with home health  [name] 05/25/2017, 8:02 AMPatient ID: Luis Brown, male   DOB: 1946/07/11, 71 y.o.   MRN: [id number]

## 2017-05-26 NOTE — Telephone Encounter (Signed)
I called patient and advised about brace. I also made follow up appt with Jeneen Rinks on Wed morning 06/07/2017 since Dr. Louanne Skye will be in clinic. Thurs and Fri the office is closed for Thanksgiving.

## 2017-05-26 NOTE — Telephone Encounter (Signed)
Can you please advise on the brace? I will send back to [facility] for scheduling of post op appt.

## 2017-05-26 NOTE — Telephone Encounter (Signed)
Can you ask Jeneen Rinks about this one and let me know.  when he was discharged from the hospital, the instructions said that he needed to wear his brace when going to the bathroom.  He stated he really can't do that and be "successful".

## 2017-05-26 NOTE — Telephone Encounter (Signed)
Patient must absolutely in at 2 week postop appointment. Appointment can be scheduled with me on a day that Luis Brown is in clinic.  Recommend that he try using brace for safety as given in Dr. Otho Ket instructions. If he must take it off to have a bowel movement he must put it on immediately after.

## 2017-05-29 ENCOUNTER — Telehealth: Payer: Self-pay | Admitting: Family Medicine

## 2017-05-29 NOTE — Telephone Encounter (Signed)
Pt called to cancel 06/02/17 testosterone injection he will call later to reschedule. Pt states he is s/p back sx and is wearing a brace for 30 days so unable to come at this time.

## 2017-06-01 NOTE — Discharge Summary (Signed)
[name] MRN: [id number] DOB/AGE: December 25, 1945 71 y.o.  Admit date: 05/23/2017 Discharge date: 06/01/2017  Admission Diagnoses:  Active Problems:   Spinal stenosis of lumbar region with neurogenic claudication   Discharge Diagnoses:  Active Problems:   Spinal stenosis of lumbar region with neurogenic claudication  status post Procedure(s): Removal of left sacroiliac screw, explore fusion L4 to S1, Reinsert hardware L5-S1, left L5 nerve decompression, local bone, allograft and Vivigen bone graft  Past Medical History:  Diagnosis Date  . Acute pharyngitis 10/21/2013  . Allergy    grass and pollen  . Anxiety and depression 10/25/2011  . BPH (benign prostatic hyperplasia) 04/23/2012  . Chicken pox as a child  . DDD (degenerative disc disease)    cervical responds to steroid injections and low back required surgery  . DDD (degenerative disc disease), lumbosacral   . Diabetes mellitus    pre  . ED (erectile dysfunction) 04/23/2012  . Elevated BP   . Esophageal reflux 02/10/2015  . Fatigue   . HTN (hypertension)   . Hyperglycemia    preDM   . Hyperlipidemia   . Insomnia   . Low back pain radiating to both legs 01/16/2017  . Measles as a child  . Medicare annual wellness visit, subsequent 02/10/2015  . Overweight(278.02)   . Personal history of colonic polyps 10/27/2012   Follows with [facility]  . Preventative health care   . 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    Surgeries: Procedure(s): Removal of left sacroiliac screw, explore fusion L4 to S1, Reinsert hardware L5-S1, left L5 nerve decompression, local bone, allograft and Vivigen bone graft on 05/23/2017   Consultants:   Discharged Condition: Improved  Hospital Course: Luis Brown is an 71 y.o. male who was admitted 05/23/2017 for operative treatment of <principal problem not specified>. Patient failed conservative treatments (please see the history and physical for the specifics) and had  severe unremitting pain that affects sleep, daily activities and work/hobbies. After pre-op clearance, the patient was taken to the operating room on 05/23/2017 and underwent  Procedure(s): Removal of left sacroiliac screw, explore fusion L4 to S1, Reinsert hardware L5-S1, left L5 nerve decompression, local bone, allograft and Vivigen bone graft.    Patient was given perioperative antibiotics:  Anti-infectives (From admission, onward)   Start     Dose/Rate Route Frequency Ordered Stop   05/23/17 2000  ceFAZolin (ANCEF) IVPB 2g/100 mL premix     2 g 200 mL/hr over 30 Minutes Intravenous Every 8 hours 05/23/17 1822 05/24/17 0525   05/23/17 1830  acyclovir (ZOVIRAX) 200 MG capsule 200 mg  Status:  Discontinued     200 mg Oral 5 times daily 05/23/17 1822 05/23/17 1849   05/23/17 1345  vancomycin (VANCOCIN) powder  Status:  Discontinued       As needed 05/23/17 1346 05/23/17 1454   05/23/17 0635  ceFAZolin (ANCEF) 2-4 GM/100ML-% IVPB    Comments:  Leandrew Koyanagi   : cabinet override      05/23/17 0635 05/23/17 1844   05/23/17 0631  ceFAZolin (ANCEF) IVPB 2g/100 mL premix     2 g 200 mL/hr over 30 Minutes Intravenous On call to O.R. 05/23/17 0631 05/23/17 1204       Patient was given sequential compression devices and early ambulation to prevent DVT.   Patient benefited maximally from hospital stay and there were no complications. At the time of discharge, the patient was urinating/moving their bowels without difficulty, tolerating a regular diet, pain  is controlled with oral pain medications and they have been cleared by PT/OT.   Recent vital signs: No data found.   Recent laboratory studies: No results for input(s): WBC, HGB, HCT, PLT, NA, K, CL, CO2, BUN, CREATININE, GLUCOSE, INR, CALCIUM in the last 72 hours.  Invalid input(s): PT, 2   Discharge Medications:   Allergies as of 05/25/2017   No Known Allergies     Medication List    STOP taking these medications   celecoxib 200 MG  capsule Commonly known as:  CELEBREX     TAKE these medications   acyclovir 200 MG capsule Commonly known as:  ZOVIRAX TAKE ONE CAPSULE BY MOUTH 5 TIMES A DAY   aspirin 81 MG tablet Take 81 mg by mouth daily.   atorvastatin 20 MG tablet Commonly known as:  LIPITOR TAKE 1/2 TABLET BY MOUTH DAILY AT 6 PM What changed:    how much to take  how to take this  when to take this  additional instructions   b complex vitamins tablet Take 1 tablet by mouth daily.   cetirizine 10 MG tablet Commonly known as:  ZYRTEC Take 1 tablet (10 mg total) by mouth daily as needed for allergies or rhinitis.   diazepam 10 MG tablet Commonly known as:  VALIUM TAKE 1 TABLET BY MOUTH EVERY DAY AT BEDTIME AS NEEDED What changed:    how much to take  how to take this  when to take this  reasons to take this  additional instructions   docusate sodium 100 MG capsule Commonly known as:  COLACE Take 1 capsule (100 mg total) 2 (two) times daily by mouth.   fluticasone 50 MCG/ACT nasal spray Commonly known as:  FLONASE Place 2 sprays into the nose daily.   gabapentin 300 MG capsule Commonly known as:  NEURONTIN Take 1 capsule (300 mg total) 3 (three) times daily by mouth.   glucose blood test strip Commonly known as:  ONE TOUCH ULTRA TEST Check daily   HYDROcodone-acetaminophen 7.5-325 MG tablet Commonly known as:  NORCO Take 1 tablet every 6 (six) hours by mouth.   lisinopril 5 MG tablet Commonly known as:  PRINIVIL,ZESTRIL TAKE 1 TABLET BY MOUTH EVERY DAY What changed:    how much to take  how to take this  when to take this   lisinopril 5 MG tablet Commonly known as:  PRINIVIL,ZESTRIL TAKE 1 TABLET BY MOUTH EVERY DAY What changed:  Another medication with the same name was changed. Make sure you understand how and when to take each.   lisinopril 5 MG tablet Commonly known as:  PRINIVIL,ZESTRIL TAKE 1 TABLET BY MOUTH EVERY DAY What changed:  Another medication with  the same name was changed. Make sure you understand how and when to take each.   metFORMIN 500 MG tablet Commonly known as:  GLUCOPHAGE Take 1 tablet (500 mg total) by mouth daily.   methocarbamol 500 MG tablet Commonly known as:  ROBAXIN TAKE 1 TABLET BY MOUTH EVERY 8 HOURS AS NEEDED FOR MUSCLE SPASM What changed:  Another medication with the same name was added. Make sure you understand how and when to take each.   methocarbamol 500 MG tablet Commonly known as:  ROBAXIN Take 1 tablet (500 mg total) every 6 (six) hours as needed by mouth for muscle spasms. What changed:  You were already taking a medication with the same name, and this prescription was added. Make sure you understand how and when to take each.  PROAIR HFA 108 (90 Base) MCG/ACT inhaler Generic drug:  albuterol TAKE 1-2 PUFFS INHALE THREE TIMES A DAY What changed:  See the new instructions.   ranitidine 300 MG tablet Commonly known as:  ZANTAC Take 1 tablet (300 mg total) by mouth daily. What changed:  when to take this   sildenafil 20 MG tablet Commonly known as:  REVATIO Take 1 tablet (20 mg total) by mouth 3 (three) times daily as needed.   tamsulosin 0.4 MG Caps capsule Commonly known as:  FLOMAX Take 2 capsules (0.8 mg total) by mouth daily. What changed:  when to take this   testosterone cypionate 100 MG/ML injection Commonly known as:  DEPOTESTOTERONE CYPIONATE Inject 100 mg into the muscle every 14 (fourteen) days.   testosterone cypionate 200 MG/ML injection Commonly known as:  DEPOTESTOSTERONE CYPIONATE Inject 1 mL (200 mg total) into the muscle once.   traMADol 50 MG tablet Commonly known as:  ULTRAM TAKE 1/2-1 TAB BY MOUTH 4 TIMES A DAY AS NEEDED FOR PAIN What changed:  See the new instructions.   venlafaxine XR 75 MG 24 hr capsule Commonly known as:  EFFEXOR-XR TAKE 1 CAPSULE BY MOUTH DAILY WITH BREAKFAST       Diagnostic Studies: Dg Chest 2 View  Result Date: 05/17/2017 CLINICAL  DATA:  Preoperative examination prior to spinal surgery. Former smoker. History of hypertension, asthma, gastroesophageal reflux, former smoker. EXAM: CHEST  2 VIEW COMPARISON:  Chest x-ray of April 08, 2011 FINDINGS: The lungs are well-expanded and clear. The heart and pulmonary vascularity are normal. The mediastinum is normal in width. There is no pleural effusion. The patient has undergone lower thoracic and lumbar fusion. The lumbar vertebral bodies are preserved in height. There is some scleroses of the endplates of 2 lower thoracic levels adjacent to a cortical screw. IMPRESSION: Mild chronic bronchitic-smoking related changes, stable. There is no acute cardiopulmonary abnormality. Electronically Signed   By: David  Martinique M.D.   On: 05/17/2017 10:51   Dg Lumbar Spine Complete  Result Date: 05/23/2017 CLINICAL DATA:  Removal of left SI joint screw, explore fusion from L4 through S1, Ree insert hardware at L5-S1, left L5 nerve decompression and bone grafting EXAM: DG C-ARM GT 120 MIN; LUMBAR SPINE - COMPLETE 4+ VIEW CONTRAST:  None FLUOROSCOPY TIME:  Fluoroscopy Time:  18 seconds Radiation Exposure Index (if provided by the fluoroscopic device): Not provided Number of Acquired Spot Images: Four COMPARISON:  None. FINDINGS: Fluoroscopic time provided during left SI joint screw removal with exploration of the L4 through S1 fusion hardware and subsequently insertion with hardware at L5-S1. No immediate postoperative complications noted. IMPRESSION: Fluoroscopic time provided during exploration of L4 through S1 fusion hardware, left SI joint screw removal with re- insertion of hardware at L5-S1. Electronically Signed   By: Ashley Royalty M.D.   On: 05/23/2017 14:25   Dg C-arm Gt 120 Min  Result Date: 05/23/2017 CLINICAL DATA:  Removal of left SI joint screw, explore fusion from L4 through S1, Ree insert hardware at L5-S1, left L5 nerve decompression and bone grafting EXAM: DG C-ARM GT 120 MIN; LUMBAR  SPINE - COMPLETE 4+ VIEW CONTRAST:  None FLUOROSCOPY TIME:  Fluoroscopy Time:  18 seconds Radiation Exposure Index (if provided by the fluoroscopic device): Not provided Number of Acquired Spot Images: Four COMPARISON:  None. FINDINGS: Fluoroscopic time provided during left SI joint screw removal with exploration of the L4 through S1 fusion hardware and subsequently insertion with hardware at L5-S1. No immediate postoperative  complications noted. IMPRESSION: Fluoroscopic time provided during exploration of L4 through S1 fusion hardware, left SI joint screw removal with re- insertion of hardware at L5-S1. Electronically Signed   By: Ashley Royalty M.D.   On: 05/23/2017 14:25    Discharge Instructions    Call MD / Call 911   Complete by:  As directed    If you experience chest pain or shortness of breath, CALL 911 and be transported to the hospital emergency room.  If you develope a fever above 101 F, pus (white drainage) or increased drainage or redness at the wound, or calf pain, call your surgeon's office.   Constipation Prevention   Complete by:  As directed    Drink plenty of fluids.  Prune juice may be helpful.  You may use a stool softener, such as Colace (over the counter) 100 mg twice a day.  Use MiraLax (over the counter) for constipation as needed.   Diet - low sodium heart healthy   Complete by:  As directed    Discharge instructions   Complete by:  As directed    Call if there is increasing drainage, fever greater than 101.5, severe head aches, and worsening nausea or light sensitivity. If shortness of breath, bloody cough or chest tightness or pain go to an emergency room. No lifting greater than 10 lbs. Avoid bending, stooping and twisting. Use brace when sitting and out of bed even to go to bathroom. Walk in house for first 2 weeks then may start to get out slowly increasing distances up to one mile by 4-6 weeks post op. After 5 days may shower and change dressing following bathing  with shower.When bathing remove the brace shower and replace brace before getting out of the shower. If drainage, keep dry dressing and do not bathe the incision, use an moisture impervious dressing. Please call and return for scheduled follow up appointment 2 weeks from the time of surgery.   Driving restrictions   Complete by:  As directed    No driving for 4 weeks   Increase activity slowly as tolerated   Complete by:  As directed    Lifting restrictions   Complete by:  As directed    No lifting for 12 weeks      Follow-up Information    Jessy Oto, MD Follow up in 2 week(s).   Specialty:  Orthopedic Surgery Why:  For wound re-check Contact information: [place] [address] [phone]           Discharge Plan:  discharge to home  Disposition:     Signed: Benjiman Core 06/01/2017, 2:47 PM

## 2017-06-02 ENCOUNTER — Ambulatory Visit: Payer: Medicare Other

## 2017-06-06 ENCOUNTER — Telehealth: Payer: Self-pay | Admitting: Family Medicine

## 2017-06-06 ENCOUNTER — Other Ambulatory Visit: Payer: Self-pay | Admitting: Family Medicine

## 2017-06-06 DIAGNOSIS — E349 Endocrine disorder, unspecified: Secondary | ICD-10-CM

## 2017-06-06 MED ORDER — TESTOSTERONE CYPIONATE 100 MG/ML IM SOLN
100.0000 mg | INTRAMUSCULAR | 0 refills | Status: DC
Start: 2017-06-06 — End: 2017-09-26

## 2017-06-06 NOTE — Telephone Encounter (Signed)
Requesting:testosterone Contract:yes UDS: no  Last OV:01/16/17 Next OV:not scheduled Last Refill:11/22/16   #[id number]   Please advise

## 2017-06-06 NOTE — Telephone Encounter (Signed)
Copied from [place] [id number]. Topic: General - Other >> Jun 06, 2017 10:35 AM Oneta Rack wrote: Relation to pt: self  Call back number: Pharmacy: [place], [place] [phone] (Phone) [phone] (Fax)  Reason for call:  Patient requesting testosterone cypionate, patient states he's completely out, please advise

## 2017-06-06 NOTE — Telephone Encounter (Signed)
I have printed rx please call in to pharmacy

## 2017-06-07 ENCOUNTER — Ambulatory Visit (INDEPENDENT_AMBULATORY_CARE_PROVIDER_SITE_OTHER): Payer: Medicare Other | Admitting: Surgery

## 2017-06-07 ENCOUNTER — Ambulatory Visit (INDEPENDENT_AMBULATORY_CARE_PROVIDER_SITE_OTHER): Payer: Self-pay

## 2017-06-07 ENCOUNTER — Encounter (INDEPENDENT_AMBULATORY_CARE_PROVIDER_SITE_OTHER): Payer: Self-pay | Admitting: Surgery

## 2017-06-07 VITALS — Ht 70.0 in | Wt 195.0 lb

## 2017-06-07 DIAGNOSIS — M960 Pseudarthrosis after fusion or arthrodesis: Secondary | ICD-10-CM

## 2017-06-07 MED ORDER — HYDROCODONE-ACETAMINOPHEN 7.5-325 MG PO TABS: 1.0000 | ORAL_TABLET | Freq: Four times a day (QID) | ORAL | 0 refills | Status: DC | PRN

## 2017-06-07 MED ORDER — DOCUSATE SODIUM 100 MG PO CAPS: 100.0000 mg | ORAL_CAPSULE | Freq: Two times a day (BID) | ORAL | 0 refills | Status: DC

## 2017-06-07 NOTE — Telephone Encounter (Signed)
Called into pharmacy

## 2017-06-07 NOTE — Progress Notes (Signed)
Post-Op Visit Note   Patient: Luis Brown           Date of Birth: [birth year]           MRN: [id number] Visit Date: 06/07/2017 PCP: [name], MD   Assessment & Plan:  Chief Complaint:  Chief Complaint  Patient presents with  . Lower Back - Routine Post Op    05/23/17 Removal of Left Sacroiliac screw, explore fusion L4 to S1, Reinsert hardware L5-S1, left L5 nerve decompression, local bone, allograft and vivigen bone graft.    Patient 2 weeks postop returns. States he is doing very well and pleased with his surgical result up to this point. Preoperative leg pain and weakness is greatly improved. States that he can already noticed improvement in left leg strength. Needs refill of pain medication and Colace.   Visit Diagnoses:  1. Pseudarthrosis after fusion or arthrodesis     Plan: X-rays were reviewed with Dr. Louanne Skye.  Patient will continue wearing his brace until at least 6-12 weeks postop. Refilled Norco and Colace.   Follow-up in 4 weeks for recheck. Advised no aggressive activity. Avoid twisting and bending and lifting carrying of objects. Walk short distances and gradually increase over the next few weeks. All questions answered. Follow-Up Instructions: Return in about 4 weeks (around 07/05/2017) for Nitka.   Orders:  Orders Placed This Encounter  Procedures  . XR Lumbar Spine 2-3 Views   Meds ordered this encounter  Medications  . HYDROcodone-acetaminophen (NORCO) 7.5-325 MG tablet    Sig: Take 1-2 tablets by mouth every 6 (six) hours as needed for moderate pain.    Dispense:  50 tablet    Refill:  0  . docusate sodium (COLACE) 100 MG capsule    Sig: Take 1 capsule (100 mg total) by mouth 2 (two) times daily.    Dispense:  50 capsule    Refill:  0    Imaging: No results found.  PMFS History: Patient Active Problem List   Diagnosis Date Noted  . 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 Thoracic stenosis 04/17/2017    . Painful orthopaedic hardware ([place]) 04/17/2017  . 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 ([place]) 04/17/2017  . Low back pain radiating to both legs 01/16/2017  . Esophageal reflux 02/10/2015  . Medicare annual wellness visit, subsequent 02/10/2015  . Personal history of colonic polyps 10/27/2012  . 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  . BPH (benign prostatic hyperplasia) 04/23/2012  . ED (erectile dysfunction) 04/23/2012  . Allergic state 10/25/2011  . Anxiety and depression 10/25/2011  . Asthma   . Hyperglycemia   . Hyperlipidemia   . Overweight   . Insomnia   . Fatigue   . Preventative health care   . DDD (degenerative disc disease), lumbosacral   . HTN (hypertension)    Past Medical History:  Diagnosis Date  . Acute pharyngitis 10/21/2013  . Allergy    grass and pollen  . Anxiety and depression 10/25/2011  . BPH (benign prostatic hyperplasia) 04/23/2012  . Chicken pox as a child  . DDD (degenerative disc disease)    cervical responds to steroid injections and low back required surgery  . DDD (degenerative disc disease), lumbosacral   . Diabetes mellitus    pre  . ED (erectile dysfunction) 04/23/2012  . Elevated BP   . Esophageal reflux 02/10/2015  . Fatigue   . HTN (hypertension)   . Hyperglycemia  preDM   . Hyperlipidemia   . Insomnia   . Low back pain radiating to both legs 01/16/2017  . Measles as a child  . Medicare annual wellness visit, subsequent 02/10/2015  . Overweight(278.02)   . Personal history of colonic polyps 10/27/2012   Follows with [facility]  . Preventative health care   . 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    Family History  Problem Relation Age of Onset  . Hypertension Mother   . Diabetes Mother        type 2  . Cancer Mother 19       breast in remission  . Emphysema Father        smoker  . COPD Father        smoker  . Stroke Father 16       mini  . Heart disease Father   .  Hypertension Sister   . Hyperlipidemia Sister   . Scoliosis Sister   . Osteoporosis Sister   . Hypertension Maternal Grandmother   . Scoliosis Maternal Grandmother   . Heart disease Maternal Grandfather   . Heart disease Paternal Grandfather        smoker  . Heart disease Daughter     Past Surgical History:  Procedure Laterality Date  . BACK SURGERY  2012 and 1994   Dr Margret Chance, screws and cage in low back  . TONSILLECTOMY    . torn rotator cuff  2010   right   Social History   Occupational History  . Not on file  Tobacco Use  . Smoking status: Former Smoker    Packs/day: 1.00    Years: 20.00    Pack years: 20.00    Types: Cigarettes    Last attempt to quit: 07/18/1989    Years since quitting: 27.9  . Smokeless tobacco: Never Used  . Tobacco comment: off and on smoking for the 20 yrs  Substance and Sexual Activity  . Alcohol use: Yes    Comment: drink or two a day- mixed drink  . Drug use: No  . Sexual activity: Yes    Comment: lives with wife, still working, no dietary restrictions, continues to exercise intermittently

## 2017-06-12 MED ORDER — TESTOSTERONE CYPIONATE 200 MG/ML IM SOLN: 200.0000 mg | Freq: Once | INTRAMUSCULAR | 0 refills | Status: DC

## 2017-06-14 ENCOUNTER — Other Ambulatory Visit: Payer: Self-pay | Admitting: Family Medicine

## 2017-06-14 DIAGNOSIS — K219 Gastro-esophageal reflux disease without esophagitis: Secondary | ICD-10-CM

## 2017-06-22 ENCOUNTER — Ambulatory Visit (INDEPENDENT_AMBULATORY_CARE_PROVIDER_SITE_OTHER): Payer: Medicare Other

## 2017-06-22 ENCOUNTER — Ambulatory Visit (INDEPENDENT_AMBULATORY_CARE_PROVIDER_SITE_OTHER): Payer: Medicare Other | Admitting: Surgery

## 2017-06-22 DIAGNOSIS — G8929 Other chronic pain: Secondary | ICD-10-CM | POA: Diagnosis not present

## 2017-06-22 DIAGNOSIS — M25561 Pain in right knee: Secondary | ICD-10-CM

## 2017-06-22 NOTE — Progress Notes (Signed)
Office Visit Note   Patient: Luis Brown           Date of Birth: Dec 03, 1945           MRN: [id number] Visit Date: 06/22/2017              Requested by: [name], MD [address] [place], [place] [address] PCP: [name], MD   Assessment & Plan: Visit Diagnoses:  1. Chronic pain of right knee     Plan: With patient's ongoing knee pain I had offered conservative treatment with right knee intra-articular Marcaine/Depo-Medrol injection. Patient's blood sugars are elevated today advised him that with his history of diabetes I do not want to perform injection. We'll have him return to the office next week but I advised him to strictly monitor his diet to make sure that his blood sugars are lower.  Voices understanding.  Follow-Up Instructions: Return in about 6 days (around 06/28/2017) for Marquise Wicke injection.   Orders:  Orders Placed This Encounter  Procedures  . XR Knee 1-2 Views Right   No orders of the defined types were placed in this encounter.     Procedures: No procedures performed   Clinical Data: No additional findings.   Subjective: No chief complaint on file.   HPI Patient comes in today with complaints of right knee pain for several months. He's had off-and-on pain in the right knee for several years but this is been worse the last couple of weeks or so. No injury. Most of his pain is on the medial joint line. Known history of DJD. Review of Systems No current cardiac pulmonary GI GU issues  Objective: Vital Signs: There were no vitals taken for this visit.  Physical Exam  Constitutional: He is oriented to person, place, and time. He appears well-developed.  HENT:  Head: Normocephalic and atraumatic.  Eyes: EOM are normal. Pupils are equal, round, and reactive to light.  Pulmonary/Chest: No respiratory distress.  Musculoskeletal:  He is somewhat antalgic. Right knee good range of motion. Some swelling without large effusion.  Medial greater than lateral joint line tenderness. Mild discomfort with McMurray's testing. Ligaments are stable. Negative logroll right hip. Patient is recovering from recent lumbar fusion and does have brace on.  Neurological: He is alert and oriented to person, place, and time.  Skin: Skin is warm and dry.    Ortho Exam  Specialty Comments:  No specialty comments available.  Imaging: No results found.   PMFS History: Patient Active Problem List   Diagnosis Date Noted  . 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 Thoracic stenosis 04/17/2017  . Painful orthopaedic hardware ([place]) 04/17/2017  . 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 Low back pain radiating to both legs 01/16/2017  . Esophageal reflux 02/10/2015  . Medicare annual wellness visit, subsequent 02/10/2015  . Personal history of colonic polyps 10/27/2012  . 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  . BPH (benign prostatic hyperplasia) 04/23/2012  . ED (erectile dysfunction) 04/23/2012  . Allergic state 10/25/2011  . Anxiety and depression 10/25/2011  . Asthma   . Hyperglycemia   . Hyperlipidemia   . Overweight   . Insomnia   . Fatigue   . Preventative health care   . DDD (degenerative disc disease), lumbosacral   . HTN (hypertension)    Past Medical History:  Diagnosis Date  . Acute pharyngitis 10/21/2013  .  Allergy    grass and pollen  . Anxiety and depression 10/25/2011  . BPH (benign prostatic hyperplasia) 04/23/2012  . Chicken pox as a child  . DDD (degenerative disc disease)    cervical responds to steroid injections and low back required surgery  . DDD (degenerative disc disease), lumbosacral   . Diabetes mellitus    pre  . ED (erectile dysfunction) 04/23/2012  . Elevated BP   . Esophageal reflux 02/10/2015  . Fatigue   . HTN (hypertension)   . Hyperglycemia    preDM   . Hyperlipidemia   . Insomnia     . Low back pain radiating to both legs 01/16/2017  . Measles as a child  . Medicare annual wellness visit, subsequent 02/10/2015  . Overweight(278.02)   . Personal history of colonic polyps 10/27/2012   Follows with [facility]  . Preventative health care   . 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    Family History  Problem Relation Age of Onset  . Hypertension Mother   . Diabetes Mother        type 2  . Cancer Mother 73       breast in remission  . Emphysema Father        smoker  . COPD Father        smoker  . Stroke Father 66       mini  . Heart disease Father   . Hypertension Sister   . Hyperlipidemia Sister   . Scoliosis Sister   . Osteoporosis Sister   . Hypertension Maternal Grandmother   . Scoliosis Maternal Grandmother   . Heart disease Maternal Grandfather   . Heart disease Paternal Grandfather        smoker  . Heart disease Daughter     Past Surgical History:  Procedure Laterality Date  . BACK SURGERY  2012 and 1994   Dr Margret Chance, screws and cage in low back  . TONSILLECTOMY    . torn rotator cuff  2010   right   Social History   Occupational History  . Not on file  Tobacco Use  . Smoking status: Former Smoker    Packs/day: 1.00    Years: 20.00    Pack years: 20.00    Types: Cigarettes    Last attempt to quit: 07/18/1989    Years since quitting: 28.0  . Smokeless tobacco: Never Used  . Tobacco comment: off and on smoking for the 20 yrs  Substance and Sexual Activity  . Alcohol use: Yes    Comment: drink or two a day- mixed drink  . Drug use: No  . Sexual activity: Yes    Comment: lives with wife, still working, no dietary restrictions, continues to exercise intermittently

## 2017-06-28 ENCOUNTER — Telehealth (INDEPENDENT_AMBULATORY_CARE_PROVIDER_SITE_OTHER): Payer: Self-pay | Admitting: Specialist

## 2017-06-28 ENCOUNTER — Encounter (INDEPENDENT_AMBULATORY_CARE_PROVIDER_SITE_OTHER): Payer: Self-pay | Admitting: Surgery

## 2017-06-28 ENCOUNTER — Ambulatory Visit (INDEPENDENT_AMBULATORY_CARE_PROVIDER_SITE_OTHER): Payer: Medicare Other | Admitting: Surgery

## 2017-06-28 DIAGNOSIS — G8929 Other chronic pain: Secondary | ICD-10-CM

## 2017-06-28 DIAGNOSIS — M25561 Pain in right knee: Secondary | ICD-10-CM

## 2017-06-28 MED ORDER — LIDOCAINE HCL 1 % IJ SOLN
3.0000 mL | INTRAMUSCULAR | Status: AC | PRN
  Administered 2017-06-28: 3 mL

## 2017-06-28 MED ORDER — BUPIVACAINE HCL 0.25 % IJ SOLN
6.0000 mL | INTRAMUSCULAR | Status: AC | PRN
  Administered 2017-06-28: 6 mL via INTRA_ARTICULAR

## 2017-06-28 MED ORDER — METHYLPREDNISOLONE ACETATE 40 MG/ML IJ SUSP
40.0000 mg | INTRAMUSCULAR | Status: AC | PRN
  Administered 2017-06-28: 40 mg via INTRA_ARTICULAR

## 2017-06-28 NOTE — Telephone Encounter (Signed)
Put pt on cancellation list and will call if something opens.

## 2017-06-28 NOTE — Progress Notes (Signed)
Office Visit Note   Patient: Luis Brown           Date of Birth: Dec 28, 1945           MRN: [id number] Visit Date: 06/28/2017              Requested by: [name], MD [address] [place],  [address] PCP: [name], MD   Assessment & Plan: Visit Diagnoses:  1. Chronic pain of right knee     Plan: Patient's blood sugar in the office today 126. After patient consent right knee was prepped with Betadine and intra-articular Marcaine/Depo-Medrol 6:1 injection was performed. If he does not get improvement with today's injection we will schedule MRI to rule out meniscal tear. I advised him that he must strictly monitor his diet over the next few days due to the potential for today's injection to cause elevation in his blood sugars. He must also strictly monitor his blood sugars several times a day. Stressing the importance of being compliant. Patient follow with Dr. Louanne Skye within next couple weeks for recheck of his lumbar spine.     Follow-Up Instructions: Return in about 8 days (around 07/06/2017) for with Dr Louanne Skye for back.   Orders:  No orders of the defined types were placed in this encounter.  No orders of the defined types were placed in this encounter.     Procedures: Large Joint Inj: R knee on 06/28/2017 11:25 AM Indications: pain and joint swelling Details: 25 G needle, anteromedial approach Medications: 3 mL lidocaine 1 %; 6 mL bupivacaine 0.25 %; 40 mg methylPREDNISolone acetate 40 MG/ML Outcome: tolerated well, no immediate complications Consent was given by the patient. Patient was prepped and draped in the usual sterile fashion.       Clinical Data: No additional findings.   Subjective: Chief Complaint  Patient presents with  . Right Knee - Pain    HPI Patient returns for right knee injection. States that he has been monitoring his diet in order to have injection today. Continues to have ongoing knee pain mostly medial  joint line. Review of Systems No current cardiopulmonary GI GU issues  Objective: Vital Signs: There were no vitals taken for this visit.  Physical Exam Gait is somewhat antalgic. Right knee some swelling. No significant effusion. Medial joint line tender. Ortho Exam  Specialty Comments:  No specialty comments available.  Imaging: No results found.   PMFS History: Patient Active Problem List   Diagnosis Date Noted  . 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 Thoracic stenosis 04/17/2017  . Painful orthopaedic hardware ([place]) 04/17/2017  . 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 Low back pain radiating to both legs 01/16/2017  . Esophageal reflux 02/10/2015  . Medicare annual wellness visit, subsequent 02/10/2015  . Personal history of colonic polyps 10/27/2012  . 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  . BPH (benign prostatic hyperplasia) 04/23/2012  . ED (erectile dysfunction) 04/23/2012  . Allergic state 10/25/2011  . Anxiety and depression 10/25/2011  . Asthma   . Hyperglycemia   . Hyperlipidemia   . Overweight   . Insomnia   . Fatigue   . Preventative health care   . DDD (degenerative disc disease), lumbosacral   . HTN (hypertension)    Past Medical History:  Diagnosis Date  . Acute pharyngitis 10/21/2013  . Allergy    grass and pollen  .  Anxiety and depression 10/25/2011  . BPH (benign prostatic hyperplasia) 04/23/2012  . Chicken pox as a child  . DDD (degenerative disc disease)    cervical responds to steroid injections and low back required surgery  . DDD (degenerative disc disease), lumbosacral   . Diabetes mellitus    pre  . ED (erectile dysfunction) 04/23/2012  . Elevated BP   . Esophageal reflux 02/10/2015  . Fatigue   . HTN (hypertension)   . Hyperglycemia    preDM   . Hyperlipidemia   . Insomnia   . Low back pain radiating to both legs 01/16/2017  .  Measles as a child  . Medicare annual wellness visit, subsequent 02/10/2015  . Overweight(278.02)   . Personal history of colonic polyps 10/27/2012   Follows with [facility]  . Preventative health care   . 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    Family History  Problem Relation Age of Onset  . Hypertension Mother   . Diabetes Mother        type 2  . Cancer Mother 72       breast in remission  . Emphysema Father        smoker  . COPD Father        smoker  . Stroke Father 64       mini  . Heart disease Father   . Hypertension Sister   . Hyperlipidemia Sister   . Scoliosis Sister   . Osteoporosis Sister   . Hypertension Maternal Grandmother   . Scoliosis Maternal Grandmother   . Heart disease Maternal Grandfather   . Heart disease Paternal Grandfather        smoker  . Heart disease Daughter     Past Surgical History:  Procedure Laterality Date  . BACK SURGERY  2012 and 1994   Dr Margret Chance, screws and cage in low back  . TONSILLECTOMY    . torn rotator cuff  2010   right   Social History   Occupational History  . Not on file  Tobacco Use  . Smoking status: Former Smoker    Packs/day: 1.00    Years: 20.00    Pack years: 20.00    Types: Cigarettes    Last attempt to quit: 07/18/1989    Years since quitting: 27.9  . Smokeless tobacco: Never Used  . Tobacco comment: off and on smoking for the 20 yrs  Substance and Sexual Activity  . Alcohol use: Yes    Comment: drink or two a day- mixed drink  . Drug use: No  . Sexual activity: Yes    Comment: lives with wife, still working, no dietary restrictions, continues to exercise intermittently

## 2017-06-28 NOTE — Telephone Encounter (Signed)
Patient has an appointment with Jeneen Rinks next Wednesday, he would like to see Dr. Louanne Skye at some point for a follow-up from surgery.  Would you please put him on the cancellation list.  [phone] Thank you

## 2017-07-03 ENCOUNTER — Encounter (INDEPENDENT_AMBULATORY_CARE_PROVIDER_SITE_OTHER): Payer: Self-pay | Admitting: Specialist

## 2017-07-03 ENCOUNTER — Ambulatory Visit (INDEPENDENT_AMBULATORY_CARE_PROVIDER_SITE_OTHER): Payer: Medicare Other

## 2017-07-03 ENCOUNTER — Ambulatory Visit (INDEPENDENT_AMBULATORY_CARE_PROVIDER_SITE_OTHER): Payer: Medicare Other | Admitting: Specialist

## 2017-07-03 VITALS — BP 125/79 | HR 66 | Ht 70.0 in | Wt 195.0 lb

## 2017-07-03 DIAGNOSIS — T84498A Other mechanical complication of other internal orthopedic devices, implants and grafts, initial encounter: Secondary | ICD-10-CM

## 2017-07-03 DIAGNOSIS — Z981 Arthrodesis status: Secondary | ICD-10-CM

## 2017-07-03 NOTE — Progress Notes (Signed)
Post-Op Visit Note   Patient: Luis Brown           Date of Birth: [birth year]           MRN: [id number] Visit Date: 07/03/2017 PCP: [name], MD   Assessment & Plan: 6 weeks post rearthrodesis L5-S1, removal of hardware iliac wing screws and revision L5 and S1 screws.  Chief Complaint:  Chief Complaint  Patient presents with  . Lower Back - Routine Post Op   Visit Diagnoses:  1. 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 History of lumbar spinal fusion   P: Yao, Marguerite M, M  1. Status post lumbar spinal fusion   Pain and weakness left leg is much improved, wants to know when he can bend and stoop and lift.  Legs m otor normal except left EHL 4+/5. Rest normal   Plan:Avoid frequent bending and stooping  No lifting greater than 10 lbs. May use ice or moist heat for pain. Weight loss is of benefit. 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     Follow-Up Instructions: No Follow-up on file.   Orders:  Orders Placed This Encounter  Procedures  . XR Lumbar Spine 2-3 Views   No orders of the defined types were placed in this encounter.   Imaging: No results found.  PMFS History: Patient Active Problem List   Diagnosis Date Noted  . 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 Thoracic stenosis 04/17/2017  . Painful orthopaedic hardware ([place]) 04/17/2017  . 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 Low back pain radiating to both legs 01/16/2017  . Esophageal reflux 02/10/2015  . Medicare annual wellness visit, subsequent 02/10/2015  . Personal history of colonic polyps 10/27/2012  . 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  . BPH (benign prostatic hyperplasia) 04/23/2012  . ED (erectile dysfunction) 04/23/2012  . Allergic state 10/25/2011  . Anxiety and depression 10/25/2011  . Asthma   . Hyperglycemia   . Hyperlipidemia   . Overweight   . Insomnia   . Fatigue   .  Preventative health care   . DDD (degenerative disc disease), lumbosacral   . HTN (hypertension)    Past Medical History:  Diagnosis Date  . Acute pharyngitis 10/21/2013  . Allergy    grass and pollen  . Anxiety and depression 10/25/2011  . BPH (benign prostatic hyperplasia) 04/23/2012  . Chicken pox as a child  . DDD (degenerative disc disease)    cervical responds to steroid injections and low back required surgery  . DDD (degenerative disc disease), lumbosacral   . Diabetes mellitus    pre  . ED (erectile dysfunction) 04/23/2012  . Elevated BP   . Esophageal reflux 02/10/2015  . Fatigue   . HTN (hypertension)   . Hyperglycemia    preDM   . Hyperlipidemia   . Insomnia   . Low back pain radiating to both legs 01/16/2017  . Measles as a child  . Medicare annual wellness visit, subsequent 02/10/2015  . Overweight(278.02)   . Personal history of colonic polyps 10/27/2012   Follows with [facility]  . Preventative health care   . 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    Family History  Problem Relation Age of Onset  . Hypertension Mother   . Diabetes Mother        type 2  . Cancer Mother 82       breast in remission  . Emphysema  Father        smoker  . COPD Father        smoker  . Stroke Father 75       mini  . Heart disease Father   . Hypertension Sister   . Hyperlipidemia Sister   . Scoliosis Sister   . Osteoporosis Sister   . Hypertension Maternal Grandmother   . Scoliosis Maternal Grandmother   . Heart disease Maternal Grandfather   . Heart disease Paternal Grandfather        smoker  . Heart disease Daughter     Past Surgical History:  Procedure Laterality Date  . BACK SURGERY  2012 and 1994   Dr Margret Chance, screws and cage in low back  . TONSILLECTOMY    . torn rotator cuff  2010   right   Social History   Occupational History  . Not on file  Tobacco Use  . Smoking status: Former Smoker    Packs/day: 1.00    Years: 20.00    Pack years: 20.00     Types: Cigarettes    Last attempt to quit: 07/18/1989    Years since quitting: 27.9  . Smokeless tobacco: Never Used  . Tobacco comment: off and on smoking for the 20 yrs  Substance and Sexual Activity  . Alcohol use: Yes    Comment: drink or two a day- mixed drink  . Drug use: No  . Sexual activity: Yes    Comment: lives with wife, still working, no dietary restrictions, continues to exercise intermittently

## 2017-07-03 NOTE — Patient Instructions (Signed)
Plan:Avoid frequent bending and stooping  No lifting greater than 10 lbs. May use ice or moist heat for pain. Weight loss is of benefit. 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

## 2017-07-05 ENCOUNTER — Ambulatory Visit (INDEPENDENT_AMBULATORY_CARE_PROVIDER_SITE_OTHER): Payer: Self-pay | Admitting: Surgery

## 2017-07-10 ENCOUNTER — Other Ambulatory Visit: Payer: Self-pay | Admitting: Family Medicine

## 2017-07-12 NOTE — Telephone Encounter (Signed)
sent 

## 2017-07-12 NOTE — Telephone Encounter (Signed)
Pt is requesting refill on diazepam.   Last OV: 01/16/2017 Last Fill: 01/16/2017 #30 and 2RF UDS: 2/29/2018 Low risk  Will you refill in PCP absence?

## 2017-07-21 DIAGNOSIS — J209 Acute bronchitis, unspecified: Secondary | ICD-10-CM | POA: Diagnosis not present

## 2017-07-27 ENCOUNTER — Ambulatory Visit (INDEPENDENT_AMBULATORY_CARE_PROVIDER_SITE_OTHER): Payer: Medicare Other | Admitting: Specialist

## 2017-07-27 ENCOUNTER — Encounter (INDEPENDENT_AMBULATORY_CARE_PROVIDER_SITE_OTHER): Payer: Self-pay

## 2017-08-09 ENCOUNTER — Other Ambulatory Visit (INDEPENDENT_AMBULATORY_CARE_PROVIDER_SITE_OTHER): Payer: Self-pay | Admitting: Specialist

## 2017-08-09 NOTE — Telephone Encounter (Signed)
Tramadol refill request 

## 2017-08-11 NOTE — Telephone Encounter (Signed)
Called rx to [facility] 

## 2017-08-14 ENCOUNTER — Encounter (INDEPENDENT_AMBULATORY_CARE_PROVIDER_SITE_OTHER): Payer: Self-pay | Admitting: Specialist

## 2017-08-14 ENCOUNTER — Ambulatory Visit (INDEPENDENT_AMBULATORY_CARE_PROVIDER_SITE_OTHER): Payer: Medicare Other

## 2017-08-14 ENCOUNTER — Ambulatory Visit (INDEPENDENT_AMBULATORY_CARE_PROVIDER_SITE_OTHER): Payer: Medicare Other | Admitting: Specialist

## 2017-08-14 DIAGNOSIS — Z981 Arthrodesis status: Secondary | ICD-10-CM

## 2017-08-14 DIAGNOSIS — Z4889 Encounter for other specified surgical aftercare: Secondary | ICD-10-CM

## 2017-08-14 NOTE — Patient Instructions (Signed)
Avoid frequent bending and stooping  No lifting greater than 10 lbs. May use ice or moist heat for pain. Weight loss is of benefit. Go to use of the smaller brace for now and plan to start weaning from the L-S brace.

## 2017-08-14 NOTE — Progress Notes (Signed)
Post-Op Visit Note   Patient: Luis Brown           Date of Birth: 1945-11-18           MRN: [id number] Visit Date: 08/14/2017 PCP: [name], MD   Assessment & Plan: 2 month 3 weeks post Redo arthrodesis L5-S1 with reinstrumenting and BMP.  Chief Complaint: No chief complaint on file.  Visit Diagnoses:  1. History of lumbar spinal fusion   Incision is healed. Legs are NV normal. No deficit either leg.  Plan:Avoid frequent bending and stooping  No lifting greater than 10 lbs. May use ice or moist heat for pain. Weight loss is of benefit.     Follow-Up Instructions: No Follow-up on file.   Orders:  Orders Placed This Encounter  Procedures  . XR Lumbar Spine 2-3 Views  . XR Lumbar Spine 2-3 Views   No orders of the defined types were placed in this encounter.   Imaging: No results found.  PMFS History: Patient Active Problem List   Diagnosis Date Noted  . 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 Thoracic stenosis 04/17/2017  . Painful orthopaedic hardware ([place]) 04/17/2017  . 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 Low back pain radiating to both legs 01/16/2017  . Esophageal reflux 02/10/2015  . Medicare annual wellness visit, subsequent 02/10/2015  . Personal history of colonic polyps 10/27/2012  . 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  . BPH (benign prostatic hyperplasia) 04/23/2012  . ED (erectile dysfunction) 04/23/2012  . Allergic state 10/25/2011  . Anxiety and depression 10/25/2011  . Asthma   . Hyperglycemia   . Hyperlipidemia   . Overweight   . Insomnia   . Fatigue   . Preventative health care   . DDD (degenerative disc disease), lumbosacral   . HTN (hypertension)    Past Medical History:  Diagnosis Date  . Acute pharyngitis 10/21/2013  . Allergy    grass and pollen  . Anxiety and depression 10/25/2011  . BPH (benign prostatic  hyperplasia) 04/23/2012  . Chicken pox as a child  . DDD (degenerative disc disease)    cervical responds to steroid injections and low back required surgery  . DDD (degenerative disc disease), lumbosacral   . Diabetes mellitus    pre  . ED (erectile dysfunction) 04/23/2012  . Elevated BP   . Esophageal reflux 02/10/2015  . Fatigue   . HTN (hypertension)   . Hyperglycemia    preDM   . Hyperlipidemia   . Insomnia   . Low back pain radiating to both legs 01/16/2017  . Measles as a child  . Medicare annual wellness visit, subsequent 02/10/2015  . Overweight(278.02)   . Personal history of colonic polyps 10/27/2012   Follows with [facility]  . Preventative health care   . 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    Family History  Problem Relation Age of Onset  . Hypertension Mother   . Diabetes Mother        type 2  . Cancer Mother 29       breast in remission  . Emphysema Father        smoker  . COPD Father        smoker  . Stroke Father 58       mini  . Heart disease Father   . Hypertension Sister   . Hyperlipidemia Sister   . Scoliosis Sister   .  Osteoporosis Sister   . Hypertension Maternal Grandmother   . Scoliosis Maternal Grandmother   . Heart disease Maternal Grandfather   . Heart disease Paternal Grandfather        smoker  . Heart disease Daughter     Past Surgical History:  Procedure Laterality Date  . BACK SURGERY  2012 and 1994   Dr Margret Chance, screws and cage in low back  . TONSILLECTOMY    . torn rotator cuff  2010   right   Social History   Occupational History  . Not on file  Tobacco Use  . Smoking status: Former Smoker    Packs/day: 1.00    Years: 20.00    Pack years: 20.00    Types: Cigarettes    Last attempt to quit: 07/18/1989    Years since quitting: 28.0  . Smokeless tobacco: Never Used  . Tobacco comment: off and on smoking for the 20 yrs  Substance and Sexual Activity  . Alcohol use: Yes    Comment: drink or two a day- mixed drink   . Drug use: No  . Sexual activity: Yes    Comment: lives with wife, still working, no dietary restrictions, continues to exercise intermittently

## 2017-08-21 ENCOUNTER — Other Ambulatory Visit: Payer: Self-pay | Admitting: Family Medicine

## 2017-08-24 ENCOUNTER — Other Ambulatory Visit: Payer: Self-pay | Admitting: Family Medicine

## 2017-09-09 ENCOUNTER — Other Ambulatory Visit: Payer: Self-pay | Admitting: Family Medicine

## 2017-09-09 DIAGNOSIS — K219 Gastro-esophageal reflux disease without esophagitis: Secondary | ICD-10-CM

## 2017-09-13 ENCOUNTER — Other Ambulatory Visit (INDEPENDENT_AMBULATORY_CARE_PROVIDER_SITE_OTHER): Payer: Self-pay | Admitting: Specialist

## 2017-09-13 NOTE — Telephone Encounter (Signed)
Celecoxib refill request  

## 2017-09-15 ENCOUNTER — Ambulatory Visit
Admission: RE | Admit: 2017-09-15 | Discharge: 2017-09-15 | Disposition: A | Discharge status: Home / Self Care | Payer: Medicare Other | Source: Ambulatory Visit | Attending: Specialist | Admitting: Specialist

## 2017-09-15 ENCOUNTER — Other Ambulatory Visit (INDEPENDENT_AMBULATORY_CARE_PROVIDER_SITE_OTHER): Payer: Self-pay | Admitting: Specialist

## 2017-09-15 DIAGNOSIS — Z4889 Encounter for other specified surgical aftercare: Secondary | ICD-10-CM

## 2017-09-15 DIAGNOSIS — M47816 Spondylosis without myelopathy or radiculopathy, lumbar region: Secondary | ICD-10-CM | POA: Diagnosis not present

## 2017-09-17 ENCOUNTER — Other Ambulatory Visit: Payer: Self-pay | Admitting: Family Medicine

## 2017-09-21 ENCOUNTER — Encounter: Payer: Self-pay | Admitting: Family Medicine

## 2017-09-25 ENCOUNTER — Encounter (INDEPENDENT_AMBULATORY_CARE_PROVIDER_SITE_OTHER): Payer: Self-pay | Admitting: Specialist

## 2017-09-25 ENCOUNTER — Ambulatory Visit (INDEPENDENT_AMBULATORY_CARE_PROVIDER_SITE_OTHER): Payer: Medicare Other | Admitting: Specialist

## 2017-09-25 VITALS — BP 121/61 | HR 91 | Ht 70.0 in | Wt 195.0 lb

## 2017-09-25 DIAGNOSIS — Z981 Arthrodesis status: Secondary | ICD-10-CM

## 2017-09-25 NOTE — Patient Instructions (Addendum)
Avoid frequent bending and stooping  No lifting greater than 10 lbs. May use ice or moist heat for pain. Weight loss is of benefit. 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  Tramadol one tablet po daily. Celebrex 200 mg daily.

## 2017-09-25 NOTE — Progress Notes (Signed)
Office Visit Note   Patient: Luis Brown           Date of Birth: [birth year]           MRN: [id number] Visit Date: 09/25/2017              Requested by: [name], MD [address] [place], [place] [address] PCP: [name], MD   Assessment & Plan: Visit Diagnoses:  1. History of lumbar fusion     Plan: Avoid frequent bending and stooping  No lifting greater than 10 lbs. May use ice or moist heat for pain. Weight loss is of benefit. 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  Follow-Up Instructions: Return in about 3 months (around 12/26/2017).   Orders:  No orders of the defined types were placed in this encounter.  No orders of the defined types were placed in this encounter.     Procedures: No procedures performed   Clinical Data: No additional findings.   Subjective: Chief Complaint  Patient presents with  . Lower Back - Follow-up    HPI  Review of Systems  Constitutional: Negative.   HENT: Negative.   Eyes: Negative.   Respiratory: Negative.   Cardiovascular: Negative.   Gastrointestinal: Negative.   Endocrine: Negative.   Genitourinary: Negative.   Musculoskeletal: Negative.   Skin: Negative.   Allergic/Immunologic: Negative.   Neurological: Negative.   Hematological: Negative.   Psychiatric/Behavioral: Negative.      Objective: Vital Signs: BP 121/61 (BP Location: Left Arm, Patient Position: Sitting)   Pulse 91   Ht 5\' 10"  (1.778 m)   Wt 195 lb (88.5 kg)   BMI 27.98 kg/m   Physical Exam  Constitutional: He is oriented to person, place, and time. He appears well-developed and well-nourished.  HENT:  Head: Normocephalic and atraumatic.  Eyes: EOM are normal. Pupils are equal, round, and reactive to light.  Neck: Normal range of motion. Neck supple.  Pulmonary/Chest: Effort normal and breath sounds normal.  Abdominal: Soft. Bowel sounds are normal.  Neurological: He is alert and oriented to person, place, and time.    Skin: Skin is warm and dry.  Psychiatric: He has a normal mood and affect. His behavior is normal. Judgment and thought content normal.    Back Exam   Tenderness  The patient is experiencing tenderness in the lumbar.  Range of Motion  Extension: abnormal  Flexion: abnormal  Lateral bend right: abnormal  Lateral bend left: abnormal  Rotation right: abnormal  Rotation left: abnormal   Muscle Strength  Right Quadriceps:  5/5  Left Quadriceps:  5/5  Right Hamstrings:  5/5  Left Hamstrings:  5/5   Tests  Straight leg raise right: negative Straight leg raise left: negative  Reflexes  Patellar: normal Achilles: abnormal Babinski's sign: normal   Other  Toe walk: normal Heel walk: normal Sensation: normal Erythema: no back redness Scars: absent  Comments:  EHl 5-/5 left and 5/5 left.      Specialty Comments:  No specialty comments available.  Imaging: No results found.   PMFS History: Patient Active Problem List   Diagnosis Date Noted  . 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 Thoracic stenosis 04/17/2017  . Painful orthopaedic hardware ([place]) 04/17/2017  . 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 Low back pain radiating to both legs 01/16/2017  . Esophageal reflux 02/10/2015  . Medicare annual  wellness visit, subsequent 02/10/2015  . Personal history of colonic polyps 10/27/2012  . 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  . BPH (benign prostatic hyperplasia) 04/23/2012  . ED (erectile dysfunction) 04/23/2012  . Allergic state 10/25/2011  . Anxiety and depression 10/25/2011  . Asthma   . Hyperglycemia   . Hyperlipidemia   . Overweight   . Insomnia   . Fatigue   . Preventative health care   . DDD (degenerative disc disease), lumbosacral   . HTN (hypertension)    Past Medical History:  Diagnosis Date  . Acute pharyngitis 10/21/2013  . Allergy     grass and pollen  . Anxiety and depression 10/25/2011  . BPH (benign prostatic hyperplasia) 04/23/2012  . Chicken pox as a child  . DDD (degenerative disc disease)    cervical responds to steroid injections and low back required surgery  . DDD (degenerative disc disease), lumbosacral   . Diabetes mellitus    pre  . ED (erectile dysfunction) 04/23/2012  . Elevated BP   . Esophageal reflux 02/10/2015  . Fatigue   . HTN (hypertension)   . Hyperglycemia    preDM   . Hyperlipidemia   . Insomnia   . Low back pain radiating to both legs 01/16/2017  . Measles as a child  . Medicare annual wellness visit, subsequent 02/10/2015  . Overweight(278.02)   . Personal history of colonic polyps 10/27/2012   Follows with [facility]  . Preventative health care   . 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    Family History  Problem Relation Age of Onset  . Hypertension Mother   . Diabetes Mother        type 2  . Cancer Mother 84       breast in remission  . Emphysema Father        smoker  . COPD Father        smoker  . Stroke Father 71       mini  . Heart disease Father   . Hypertension Sister   . Hyperlipidemia Sister   . Scoliosis Sister   . Osteoporosis Sister   . Hypertension Maternal Grandmother   . Scoliosis Maternal Grandmother   . Heart disease Maternal Grandfather   . Heart disease Paternal Grandfather        smoker  . Heart disease Daughter     Past Surgical History:  Procedure Laterality Date  . BACK SURGERY  2012 and 1994   Dr Margret Chance, screws and cage in low back  . TONSILLECTOMY    . torn rotator cuff  2010   right   Social History   Occupational History  . Not on file  Tobacco Use  . Smoking status: Former Smoker    Packs/day: 1.00    Years: 20.00    Pack years: 20.00    Types: Cigarettes    Last attempt to quit: 07/18/1989    Years since quitting: 28.2  . Smokeless tobacco: Never Used  . Tobacco comment: off and on smoking for the 20 yrs  Substance and  Sexual Activity  . Alcohol use: Yes    Comment: drink or two a day- mixed drink  . Drug use: No  . Sexual activity: Yes    Comment: lives with wife, still working, no dietary restrictions, continues to exercise intermittently

## 2017-09-26 ENCOUNTER — Ambulatory Visit (INDEPENDENT_AMBULATORY_CARE_PROVIDER_SITE_OTHER): Payer: Medicare Other | Admitting: *Deleted

## 2017-09-26 DIAGNOSIS — E349 Endocrine disorder, unspecified: Secondary | ICD-10-CM

## 2017-09-26 MED ORDER — TESTOSTERONE CYPIONATE 200 MG/ML IM SOLN
200.0000 mg | INTRAMUSCULAR | Status: DC
  Administered 2017-09-26: 200 mg via INTRAMUSCULAR

## 2017-09-26 NOTE — Progress Notes (Signed)
Pre visit review using our clinic review tool, if applicable. No additional management support is needed unless otherwise documented below in the visit note.  Pt here for testosterone injection per order of Dr [name] and brings medication with him.  Currently getting testosterone 200mg  every 14 days.  Has been getting injection from his daughter in law(nurse) and is due now and requesting from [place] today.  Testosterone 200mg /mL given IM left Gluteus medius. Pt tolerated injection well.  Next injection scheduled for 10/10/17 at 9:45am.

## 2017-09-27 ENCOUNTER — Other Ambulatory Visit: Payer: Self-pay | Admitting: Family Medicine

## 2017-10-05 ENCOUNTER — Other Ambulatory Visit: Payer: Self-pay | Admitting: Family Medicine

## 2017-10-05 NOTE — Telephone Encounter (Signed)
Copied from [place]. Topic: Quick Communication - See Telephone Encounter >> Oct 05, 2017  5:47 PM Ivar Drape wrote: CRM for notification. See Telephone encounter for: 10/05/17. Patient needs a refill on his testosterone cypionate (DEPOTESTOSTERONE CYPIONATE) injection 200 mg medication and send it to his preferred pharmacy at [facility] on [address]

## 2017-10-06 ENCOUNTER — Other Ambulatory Visit: Payer: Self-pay

## 2017-10-06 MED ORDER — TESTOSTERONE CYPIONATE 200 MG/ML IM SOLN: 200.0000 mg | INTRAMUSCULAR | 0 refills | Status: DC

## 2017-10-06 MED ORDER — LISINOPRIL 5 MG PO TABS: 5.0000 mg | ORAL_TABLET | Freq: Every day | ORAL | 1 refills | Status: DC

## 2017-10-06 MED ORDER — METFORMIN HCL 500 MG PO TABS: 500.0000 mg | ORAL_TABLET | Freq: Every day | ORAL | 1 refills | Status: DC

## 2017-10-06 MED ORDER — VENLAFAXINE HCL ER 75 MG PO CP24: ORAL_CAPSULE | ORAL | 0 refills | Status: DC

## 2017-10-06 NOTE — Telephone Encounter (Signed)
Spoke with patient he has an injection coming up this Tuesday.  Made an appt for 10/27/17  And then 01/25/18 for CPE.  He states he needs the testosterone filled before Tuesday bc he will be due for his injections. He is getting injected every 14 days.   Would like rx sent to [facility]

## 2017-10-06 NOTE — Telephone Encounter (Signed)
Yes we need to treat this as a controlled med. He needs a testosterone check and to be seen every 6 months. Can give one refill til this done and he should have a contract. He does not need a uds.

## 2017-10-06 NOTE — Telephone Encounter (Signed)
Patient requesting testosterone  Do I need to send request as if a control substance?  Please advise    Requesting:testosterone Contract:needs one JOI:NOMVE one Last OV:01/16/17 Next OV:not scheduled Last Refill: Database:   Please advise

## 2017-10-10 ENCOUNTER — Ambulatory Visit (INDEPENDENT_AMBULATORY_CARE_PROVIDER_SITE_OTHER): Payer: Medicare Other | Admitting: *Deleted

## 2017-10-10 DIAGNOSIS — E349 Endocrine disorder, unspecified: Secondary | ICD-10-CM

## 2017-10-10 MED ORDER — TESTOSTERONE CYPIONATE 200 MG/ML IM SOLN
200.0000 mg | INTRAMUSCULAR | Status: DC
  Administered 2017-10-10: 200 mg via INTRAMUSCULAR

## 2017-10-10 NOTE — Progress Notes (Signed)
Pre visit review using our clinic review tool, if applicable. No additional management support is needed unless otherwise documented below in the visit note.  Pt here for testosterone injection per Dr [name] and brings medication with him. He is getting 200mg  every 14 days.  Testosterone 200mg /mL given to patient IM, right gluteus medius and patient tolerated injection well.  Pt due for next injection on 10/24/17 but he already has a follow up scheduled with PCP on 10/27/17 and he will get injection at that visit.

## 2017-10-24 ENCOUNTER — Ambulatory Visit: Payer: Medicare Other

## 2017-10-26 ENCOUNTER — Telehealth: Payer: Self-pay | Admitting: Family Medicine

## 2017-10-26 NOTE — Telephone Encounter (Signed)
Copied from [place] [id number]. Topic: Quick Communication - Rx Refill/Question >> Oct 26, 2017  1:30 PM Synthia Innocent wrote: Medication: metFORMIN (GLUCOPHAGE) 500 MG tablet and venlafaxine XR (EFFEXOR-XR) 75 MG 24 hr capsule  90 day supply Has the patient contacted their pharmacy? Yes.   (Agent: If no, request that the patient contact the pharmacy for the refill.) Preferred Pharmacy (with phone number or street name): [facility] Agent: Please be advised that RX refills may take up to 3 business days. We ask that you follow-up with your pharmacy.

## 2017-10-26 NOTE — Telephone Encounter (Signed)
Phone call to [facility] to verify if refill order was rec'd for Metformin and Venlafaxine on 10/06/17; Pharmacy Tech confirmed that the refills are available.  Reported he had picked up previous Rx on the above meds on 09/28/17.  Stated that the Rx's are on file for the above meds, with qty of # 90.  Phone call to pt.  Informed of the above information.  Verb. Understanding.

## 2017-10-27 ENCOUNTER — Ambulatory Visit: Payer: Medicare Other | Admitting: Family Medicine

## 2017-10-30 ENCOUNTER — Encounter: Payer: Self-pay | Admitting: Family Medicine

## 2017-10-30 ENCOUNTER — Ambulatory Visit: Payer: Medicare Other | Admitting: Family Medicine

## 2017-10-30 VITALS — BP 145/76 | HR 85 | Temp 98.1°F | Resp 18 | Wt 200.2 lb

## 2017-10-30 DIAGNOSIS — E663 Overweight: Secondary | ICD-10-CM | POA: Diagnosis not present

## 2017-10-30 DIAGNOSIS — E349 Endocrine disorder, unspecified: Secondary | ICD-10-CM | POA: Diagnosis not present

## 2017-10-30 DIAGNOSIS — Z1211 Encounter for screening for malignant neoplasm of colon: Secondary | ICD-10-CM | POA: Diagnosis not present

## 2017-10-30 DIAGNOSIS — F329 Major depressive disorder, single episode, unspecified: Secondary | ICD-10-CM

## 2017-10-30 DIAGNOSIS — I10 Essential (primary) hypertension: Secondary | ICD-10-CM | POA: Diagnosis not present

## 2017-10-30 DIAGNOSIS — F32A Depression, unspecified: Secondary | ICD-10-CM

## 2017-10-30 DIAGNOSIS — F419 Anxiety disorder, unspecified: Secondary | ICD-10-CM | POA: Diagnosis not present

## 2017-10-30 DIAGNOSIS — M48062 Spinal stenosis, lumbar region with neurogenic claudication: Secondary | ICD-10-CM

## 2017-10-30 DIAGNOSIS — R739 Hyperglycemia, unspecified: Secondary | ICD-10-CM

## 2017-10-30 DIAGNOSIS — E782 Mixed hyperlipidemia: Secondary | ICD-10-CM | POA: Diagnosis not present

## 2017-10-30 MED ORDER — VENLAFAXINE HCL ER 150 MG PO CP24
150.0000 mg | ORAL_CAPSULE | Freq: Every day | ORAL | 1 refills | Status: DC
Start: 2017-10-30 — End: 2018-04-20

## 2017-10-30 NOTE — Assessment & Plan Note (Signed)
Encouraged DASH diet, decrease po intake and increase exercise as tolerated. Needs 7-8 hours of sleep nightly. Avoid trans fats, eat small, frequent meals every 4-5 hours with lean proteins, complex carbs and healthy fats. Minimize simple carbs 

## 2017-10-30 NOTE — Assessment & Plan Note (Signed)
Check level and continue treatment

## 2017-10-30 NOTE — Assessment & Plan Note (Signed)
>>  ASSESSMENT AND PLAN FOR TESTOSTERONE DEFICIENCY WRITTEN ON 10/30/2017  2:39 PM BY BLYTH, STACEY A, MD  Check level and continue treatment

## 2017-10-30 NOTE — Progress Notes (Signed)
Subjective:  I acted as a Education administrator for Dr. [name]. [place], [place]  [name], male    DOB: 02-24-1946, 72 y.o.   MRN: [id number]  No chief complaint on file.   HPI  Patient is in today for a testosterone follow up. He is struggling with increased anxiety and irritability since having his back surgery in November of 2018. He is unable to move and has daily pain so he notes that is making him cranky. No suicidal ideation. He is also noting a dry cough at times but no other acute concerns or other hospitalizations. Denies CP/palp/SOB/HA/congestion/fevers/GI or GU c/o. Taking meds as prescribed  Patient Care Team: [name], MD as PCP - General (Family Medicine) Keene Breath., MD (Ophthalmology) Jessy Oto, MD as Consulting Physician (Orthopedic Surgery)   Past Medical History:  Diagnosis Date  . Acute pharyngitis 10/21/2013  . Allergy    grass and pollen  . Anxiety and depression 10/25/2011  . BPH (benign prostatic hyperplasia) 04/23/2012  . Chicken pox as a child  . DDD (degenerative disc disease)    cervical responds to steroid injections and low back required surgery  . DDD (degenerative disc disease), lumbosacral   . Diabetes mellitus    pre  . ED (erectile dysfunction) 04/23/2012  . Elevated BP   . Esophageal reflux 02/10/2015  . Fatigue   . HTN (hypertension)   . Hyperglycemia    preDM   . Hyperlipidemia   . Insomnia   . Low back pain radiating to both legs 01/16/2017  . Measles as a child  . Medicare annual wellness visit, subsequent 02/10/2015  . Overweight(278.02)   . Personal history of colonic polyps 10/27/2012   Follows with [facility]  . Preventative health care   . 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    Past Surgical History:  Procedure Laterality Date  . BACK SURGERY  2012 and 1994   Dr Margret Chance, screws and cage in low back  . TONSILLECTOMY    . torn rotator cuff  2010   right    Family History  Problem Relation Age of  Onset  . Hypertension Mother   . Diabetes Mother        type 2  . Cancer Mother 39       breast in remission  . Emphysema Father        smoker  . COPD Father        smoker  . Stroke Father 84       mini  . Heart disease Father   . Hypertension Sister   . Hyperlipidemia Sister   . Scoliosis Sister   . Osteoporosis Sister   . Hypertension Maternal Grandmother   . Scoliosis Maternal Grandmother   . Heart disease Maternal Grandfather   . Heart disease Paternal Grandfather        smoker  . Heart disease Daughter     Social History   Socioeconomic History  . Marital status: Married    Spouse name: Not on file  . Number of children: Not on file  . Years of education: Not on file  . Highest education level: Not on file  Occupational History  . Not on file  Social Needs  . Financial resource strain: Not on file  . Food insecurity:    Worry: Not on file    Inability: Not on file  . Transportation needs:    Medical: Not on file    Non-medical: Not on  file  Tobacco Use  . Smoking status: Former Smoker    Packs/day: 1.00    Years: 20.00    Pack years: 20.00    Types: Cigarettes    Last attempt to quit: 07/18/1989    Years since quitting: 28.3  . Smokeless tobacco: Never Used  . Tobacco comment: off and on smoking for the 20 yrs  Substance and Sexual Activity  . Alcohol use: Yes    Comment: drink or two a day- mixed drink  . Drug use: No  . Sexual activity: Yes    Comment: lives with wife, still working, no dietary restrictions, continues to exercise intermittently  Lifestyle  . Physical activity:    Days per week: Not on file    Minutes per session: Not on file  . Stress: Not on file  Relationships  . Social connections:    Talks on phone: Not on file    Gets together: Not on file    Attends religious service: Not on file    Active member of club or organization: Not on file    Attends meetings of clubs or organizations: Not on file    Relationship status: Not  on file  . Intimate partner violence:    Fear of current or ex partner: Not on file    Emotionally abused: Not on file    Physically abused: Not on file    Forced sexual activity: Not on file  Other Topics Concern  . Not on file  Social History Narrative  . Not on file    Outpatient Medications Prior to Visit  Medication Sig Dispense Refill  . acyclovir (ZOVIRAX) 200 MG capsule TAKE ONE CAPSULE BY MOUTH 5 TIMES A DAY 25 capsule 0  . aspirin 81 MG tablet Take 81 mg by mouth daily.    Marland Kitchen atorvastatin (LIPITOR) 20 MG tablet TAKE 1/2 TABLET BY MOUTH DAILY AT 6 PM (Patient taking differently: Take 10 mg by mouth daily at 6 PM. TAKE 1/2 TABLET BY MOUTH DAILY AT 6 PM) 90 tablet 1  . b complex vitamins tablet Take 1 tablet by mouth daily.    . celecoxib (CELEBREX) 200 MG capsule TAKE ONE CAPSULE BY MOUTH ONE TIME DAILY  90 capsule 0  . diazepam (VALIUM) 10 MG tablet TAKE ONE TABLET BY MOUTH EVERY DAY AT BEDTIME AS NEEDED 30 tablet 1  . gabapentin (NEURONTIN) 300 MG capsule Take 1 capsule (300 mg total) 3 (three) times daily by mouth. 60 capsule 1  . glucose blood (ONE TOUCH ULTRA TEST) test strip Check daily 32 each 1  . HYDROcodone-acetaminophen (NORCO) 7.5-325 MG tablet Take 1-2 tablets by mouth every 6 (six) hours as needed for moderate pain. 50 tablet 0  . lisinopril (PRINIVIL,ZESTRIL) 5 MG tablet Take 1 tablet (5 mg total) by mouth daily. 90 tablet 1  . metFORMIN (GLUCOPHAGE) 500 MG tablet Take 1 tablet (500 mg total) by mouth daily. 90 tablet 1  . methocarbamol (ROBAXIN) 500 MG tablet TAKE 1 TABLET BY MOUTH EVERY 8 HOURS AS NEEDED FOR MUSCLE SPASM 50 tablet 0  . methocarbamol (ROBAXIN) 500 MG tablet Take 1 tablet (500 mg total) every 6 (six) hours as needed by mouth for muscle spasms. 40 tablet 1  . PROAIR HFA 108 (90 Base) MCG/ACT inhaler TAKE 1-2 PUFFS INHALE THREE TIMES A DAY (Patient taking differently: TAKE 1-2 PUFFS INHALE THREE TIMES A DAY AS NEEDED FOR SHORTNESS OF BREATH) 8.5 Inhaler  0  . ranitidine (ZANTAC) 300 MG tablet  TAKE 1 TABLET BY MOUTH EVERY DAY 90 tablet 0  . sildenafil (REVATIO) 20 MG tablet Take 1 tablet (20 mg total) by mouth 3 (three) times daily as needed. 50 tablet 3  . tamsulosin (FLOMAX) 0.4 MG CAPS capsule TAKE TWO CAPSULES BY MOUTH DAILY  180 capsule 0  . traMADol (ULTRAM) 50 MG tablet TAKE 1/2 TO 1 TABLET 4 TIMES A DAY AS NEEDED FOR PAIN 120 tablet 0  . docusate sodium (COLACE) 100 MG capsule Take 1 capsule (100 mg total) 2 (two) times daily by mouth. 10 capsule 0  . docusate sodium (COLACE) 100 MG capsule Take 1 capsule (100 mg total) by mouth 2 (two) times daily. 50 capsule 0  . HYDROcodone-acetaminophen (NORCO) 7.5-325 MG tablet Take 1 tablet every 6 (six) hours by mouth. 50 tablet 0  . venlafaxine XR (EFFEXOR-XR) 75 MG 24 hr capsule TAKE ONE CAPSULE BY MOUTH ONE TIME DAILY WITH BREAKFAST 90 capsule 0  . cetirizine (ZYRTEC) 10 MG tablet Take 1 tablet (10 mg total) by mouth daily as needed for allergies or rhinitis. 30 tablet 11  . fluticasone (FLONASE) 50 MCG/ACT nasal spray Place 2 sprays into the nose daily. 48 g 1   Facility-Administered Medications Prior to Visit  Medication Dose Route Frequency Provider Last Rate Last Dose  . testosterone cypionate (DEPOTESTOSTERONE CYPIONATE) injection 200 mg  200 mg Intramuscular Q14 Days Penni Homans A, MD   200 mg at 10/10/17 1120    No Known Allergies  Review of Systems  Constitutional: Negative for fever and malaise/fatigue.  HENT: Positive for congestion.   Eyes: Negative for blurred vision.  Respiratory: Positive for cough. Negative for shortness of breath.   Cardiovascular: Negative for chest pain, palpitations and leg swelling.  Gastrointestinal: Negative for abdominal pain, blood in stool and nausea.  Genitourinary: Negative for dysuria and frequency.  Musculoskeletal: Positive for back pain. Negative for falls.  Skin: Negative for rash.  Neurological: Negative for dizziness, loss of  consciousness and headaches.  Endo/Heme/Allergies: Negative for environmental allergies.  Psychiatric/Behavioral: Negative for depression. The patient is not nervous/anxious.        Objective:    Physical Exam  Constitutional: He is oriented to person, place, and time. He appears well-developed and well-nourished. No distress.  HENT:  Head: Normocephalic and atraumatic.  Nose: Nose normal.  Eyes: Right eye exhibits no discharge. Left eye exhibits no discharge.  Neck: Normal range of motion. Neck supple.  Cardiovascular: Normal rate and regular rhythm.  No murmur heard. Pulmonary/Chest: Effort normal and breath sounds normal.  Abdominal: Soft. Bowel sounds are normal. There is no tenderness.  Musculoskeletal: He exhibits no edema.  Neurological: He is alert and oriented to person, place, and time.  Skin: Skin is warm and dry.  Psychiatric: He has a normal mood and affect.  Nursing note and vitals reviewed.   BP (!) 145/76 (BP Location: Left Arm, Patient Position: Sitting, Cuff Size: Normal)   Pulse 85   Temp 98.1 F (36.7 C) (Oral)   Resp 18   Wt 200 lb 3.2 oz (90.8 kg)   SpO2 98%   BMI 28.73 kg/m  Wt Readings from Last 3 Encounters:  10/30/17 200 lb 3.2 oz (90.8 kg)  09/25/17 195 lb (88.5 kg)  07/03/17 195 lb (88.5 kg)   BP Readings from Last 3 Encounters:  10/30/17 (!) 145/76  09/25/17 121/61  07/03/17 125/79     Immunization History  Administered Date(s) Administered  . Influenza Split 04/23/2012  . Influenza  Whole 02/16/2011  . Influenza, High Dose Seasonal PF 05/06/2013, 03/25/2016, 05/05/2017  . Pneumococcal Conjugate-13 03/25/2016  . Pneumococcal Polysaccharide-23 10/19/2011  . Td 07/18/2008  . Zoster 04/27/2016    Health Maintenance  Topic Date Due  . OPHTHALMOLOGY EXAM  04/17/2017  . FOOT EXAM  08/23/2017  . INFLUENZA VACCINE  02/15/2018  . HEMOGLOBIN A1C  05/01/2018  . TETANUS/TDAP  07/18/2018  . COLONOSCOPY  09/05/2022  . Hepatitis C  Screening  Completed  . PNA vac Low Risk Adult  Completed    Lab Results  Component Value Date   WBC 7.7 10/30/2017   HGB 15.3 10/30/2017   HCT 45.5 10/30/2017   PLT 367.0 10/30/2017   GLUCOSE 124 (H) 10/30/2017   CHOL 156 10/30/2017   TRIG 251.0 (H) 10/30/2017   HDL 80.80 10/30/2017   LDLDIRECT 64.0 10/30/2017   LDLCALC 55 01/16/2017   ALT 17 10/30/2017   AST 37 10/30/2017   NA 136 10/30/2017   K 4.7 10/30/2017   CL 99 10/30/2017   CREATININE 1.00 10/30/2017   BUN 24 (H) 10/30/2017   CO2 31 10/30/2017   TSH 3.84 10/30/2017   PSA 0.32 01/16/2017   INR 1.00 05/17/2017   HGBA1C 6.6 (H) 10/30/2017   MICROALBUR 0.61 04/23/2012    Lab Results  Component Value Date   TSH 3.84 10/30/2017   Lab Results  Component Value Date   WBC 7.7 10/30/2017   HGB 15.3 10/30/2017   HCT 45.5 10/30/2017   MCV 97.2 10/30/2017   PLT 367.0 10/30/2017   Lab Results  Component Value Date   NA 136 10/30/2017   K 4.7 10/30/2017   CO2 31 10/30/2017   GLUCOSE 124 (H) 10/30/2017   BUN 24 (H) 10/30/2017   CREATININE 1.00 10/30/2017   BILITOT 1.1 10/30/2017   ALKPHOS 64 10/30/2017   AST 37 10/30/2017   ALT 17 10/30/2017   PROT 7.0 10/30/2017   ALBUMIN 4.3 10/30/2017   CALCIUM 9.6 10/30/2017   ANIONGAP 7 05/24/2017   GFR 77.60 10/30/2017   Lab Results  Component Value Date   CHOL 156 10/30/2017   Lab Results  Component Value Date   HDL 80.80 10/30/2017   Lab Results  Component Value Date   LDLCALC 55 01/16/2017   Lab Results  Component Value Date   TRIG 251.0 (H) 10/30/2017   Lab Results  Component Value Date   CHOLHDL 2 10/30/2017   Lab Results  Component Value Date   HGBA1C 6.6 (H) 10/30/2017         Assessment & Plan:   Problem List Items Addressed This Visit    HTN (hypertension)    Well controlled, no changes to meds. Encouraged heart healthy diet such as the DASH diet and exercise as tolerated.       Relevant Orders   CBC (Completed)   Comprehensive  metabolic panel (Completed)   TSH (Completed)   Hyperglycemia    hgba1c acceptable, minimize simple carbs. Increase exercise as tolerated.       Relevant Orders   Hemoglobin A1c (Completed)   Hyperlipidemia    Encouraged heart healthy diet, increase exercise, avoid trans fats, consider a krill oil cap daily      Relevant Orders   Lipid panel (Completed)   Overweight    Encouraged DASH diet, decrease po intake and increase exercise as tolerated. Needs 7-8 hours of sleep nightly. Avoid trans fats, eat small, frequent meals every 4-5 hours with lean proteins, complex carbs and healthy  fats. Minimize simple carbs      Anxiety and depression    Can increase venlafaxine XR to 150 mg daily and reassess in 3 months      Relevant Medications   venlafaxine XR (EFFEXOR-XR) 150 MG 24 hr capsule   Testosterone deficiency    Check level and continue treatment      Relevant Orders   Testosterone (Completed)   Spinal stenosis of lumbar region with neurogenic claudication    Had surgery on November 6 across multiple lumbar levels with Dr Louanne Skye and he is healing slowly. Is off his pain meds.        Other Visit Diagnoses    Colon cancer screening    -  Primary   Relevant Orders   Ambulatory referral to Gastroenterology      I have discontinued Jahlon E. Kiesling's docusate sodium, docusate sodium, and venlafaxine XR. I am also having him start on venlafaxine XR and Testosterone. Additionally, I am having him maintain his aspirin, b complex vitamins, cetirizine, fluticasone, glucose blood, PROAIR HFA, sildenafil, methocarbamol, atorvastatin, gabapentin, methocarbamol, HYDROcodone-acetaminophen, diazepam, traMADol, tamsulosin, ranitidine, celecoxib, acyclovir, lisinopril, and metFORMIN. We will continue to administer testosterone cypionate.  Meds ordered this encounter  Medications  . venlafaxine XR (EFFEXOR-XR) 150 MG 24 hr capsule    Sig: Take 1 capsule (150 mg total) by mouth daily with  breakfast.    Dispense:  90 capsule    Refill:  1  . Testosterone (ANDROGEL) 25 MG/2.5GM (1%) GEL    Sig: Place 50 mg onto the skin every morning.    Dispense:  12.5 g    Refill:  3    CMA served as scribe during this visit. History, Physical and Plan performed by medical provider. Documentation and orders reviewed and attested to.  Penni Homans, MD

## 2017-10-30 NOTE — Assessment & Plan Note (Signed)
Encouraged heart healthy diet, increase exercise, avoid trans fats, consider a krill oil cap daily 

## 2017-10-30 NOTE — Patient Instructions (Signed)
Shingrix is the new shingles shot, 2 shots over 2-6 months.  At pharmacy  Hypertension Hypertension, commonly called high blood pressure, is when the force of blood pumping through the arteries is too strong. The arteries are the blood vessels that carry blood from the heart throughout the body. Hypertension forces the heart to work harder to pump blood and may cause arteries to become narrow or stiff. Having untreated or uncontrolled hypertension can cause heart attacks, strokes, kidney disease, and other problems. A blood pressure reading consists of a higher number over a lower number. Ideally, your blood pressure should be below 120/80. The first ("top") number is called the systolic pressure. It is a measure of the pressure in your arteries as your heart beats. The second ("bottom") number is called the diastolic pressure. It is a measure of the pressure in your arteries as the heart relaxes. What are the causes? The cause of this condition is not known. What increases the risk? Some risk factors for high blood pressure are under your control. Others are not. Factors you can change  Smoking.  Having type 2 diabetes mellitus, high cholesterol, or both.  Not getting enough exercise or physical activity.  Being overweight.  Having too much fat, sugar, calories, or salt (sodium) in your diet.  Drinking too much alcohol. Factors that are difficult or impossible to change  Having chronic kidney disease.  Having a family history of high blood pressure.  Age. Risk increases with age.  Race. You may be at higher risk if you are African-American.  Gender. Men are at higher risk than women before age 38. After age 26, women are at higher risk than men.  Having obstructive sleep apnea.  Stress. What are the signs or symptoms? Extremely high blood pressure (hypertensive crisis) may cause:  Headache.  Anxiety.  Shortness of breath.  Nosebleed.  Nausea and vomiting.  Severe  chest pain.  Jerky movements you cannot control (seizures).  How is this diagnosed? This condition is diagnosed by measuring your blood pressure while you are seated, with your arm resting on a surface. The cuff of the blood pressure monitor will be placed directly against the skin of your upper arm at the level of your heart. It should be measured at least twice using the same arm. Certain conditions can cause a difference in blood pressure between your right and left arms. Certain factors can cause blood pressure readings to be lower or higher than normal (elevated) for a short period of time:  When your blood pressure is higher when you are in a health care provider's office than when you are at home, this is called white coat hypertension. Most people with this condition do not need medicines.  When your blood pressure is higher at home than when you are in a health care provider's office, this is called masked hypertension. Most people with this condition may need medicines to control blood pressure.  If you have a high blood pressure reading during one visit or you have normal blood pressure with other risk factors:  You may be asked to return on a different day to have your blood pressure checked again.  You may be asked to monitor your blood pressure at home for 1 week or longer.  If you are diagnosed with hypertension, you may have other blood or imaging tests to help your health care provider understand your overall risk for other conditions. How is this treated? This condition is treated by making healthy  lifestyle changes, such as eating healthy foods, exercising more, and reducing your alcohol intake. Your health care provider may prescribe medicine if lifestyle changes are not enough to get your blood pressure under control, and if:  Your systolic blood pressure is above 130.  Your diastolic blood pressure is above 80.  Your personal target blood pressure may vary depending on  your medical conditions, your age, and other factors. Follow these instructions at home: Eating and drinking  Eat a diet that is high in fiber and potassium, and low in sodium, added sugar, and fat. An example eating plan is called the DASH (Dietary Approaches to Stop Hypertension) diet. To eat this way: ? Eat plenty of fresh fruits and vegetables. Try to fill half of your plate at each meal with fruits and vegetables. ? Eat whole grains, such as whole wheat pasta, brown rice, or whole grain bread. Fill about one quarter of your plate with whole grains. ? Eat or drink low-fat dairy products, such as skim milk or low-fat yogurt. ? Avoid fatty cuts of meat, processed or cured meats, and poultry with skin. Fill about one quarter of your plate with lean proteins, such as fish, chicken without skin, beans, eggs, and tofu. ? Avoid premade and processed foods. These tend to be higher in sodium, added sugar, and fat.  Reduce your daily sodium intake. Most people with hypertension should eat less than 1,500 mg of sodium a day.  Limit alcohol intake to no more than 1 drink a day for nonpregnant women and 2 drinks a day for men. One drink equals 12 oz of beer, 5 oz of wine, or 1 oz of hard liquor. Lifestyle  Work with your health care provider to maintain a healthy body weight or to lose weight. Ask what an ideal weight is for you.  Get at least 30 minutes of exercise that causes your heart to beat faster (aerobic exercise) most days of the week. Activities may include walking, swimming, or biking.  Include exercise to strengthen your muscles (resistance exercise), such as pilates or lifting weights, as part of your weekly exercise routine. Try to do these types of exercises for 30 minutes at least 3 days a week.  Do not use any products that contain nicotine or tobacco, such as cigarettes and e-cigarettes. If you need help quitting, ask your health care provider.  Monitor your blood pressure at home  as told by your health care provider.  Keep all follow-up visits as told by your health care provider. This is important. Medicines  Take over-the-counter and prescription medicines only as told by your health care provider. Follow directions carefully. Blood pressure medicines must be taken as prescribed.  Do not skip doses of blood pressure medicine. Doing this puts you at risk for problems and can make the medicine less effective.  Ask your health care provider about side effects or reactions to medicines that you should watch for. Contact a health care provider if:  You think you are having a reaction to a medicine you are taking.  You have headaches that keep coming back (recurring).  You feel dizzy.  You have swelling in your ankles.  You have trouble with your vision. Get help right away if:  You develop a severe headache or confusion.  You have unusual weakness or numbness.  You feel faint.  You have severe pain in your chest or abdomen.  You vomit repeatedly.  You have trouble breathing. Summary  Hypertension is when the  force of blood pumping through your arteries is too strong. If this condition is not controlled, it may put you at risk for serious complications.  Your personal target blood pressure may vary depending on your medical conditions, your age, and other factors. For most people, a normal blood pressure is less than 120/80.  Hypertension is treated with lifestyle changes, medicines, or a combination of both. Lifestyle changes include weight loss, eating a healthy, low-sodium diet, exercising more, and limiting alcohol. This information is not intended to replace advice given to you by your health care provider. Make sure you discuss any questions you have with your health care provider. Document Released: 07/04/2005 Document Revised: 06/01/2016 Document Reviewed: 06/01/2016 Elsevier Interactive Patient Education  [employer].

## 2017-10-30 NOTE — Assessment & Plan Note (Signed)
Well controlled, no changes to meds. Encouraged heart healthy diet such as the DASH diet and exercise as tolerated.  °

## 2017-10-30 NOTE — Assessment & Plan Note (Signed)
Had surgery on November 6 across multiple lumbar levels with Dr Louanne Skye and he is healing slowly. Is off his pain meds.

## 2017-10-30 NOTE — Assessment & Plan Note (Signed)
Can increase venlafaxine XR to 150 mg daily and reassess in 3 months

## 2017-10-30 NOTE — Assessment & Plan Note (Signed)
hgba1c acceptable, minimize simple carbs. Increase exercise as tolerated.  

## 2017-10-31 LAB — COMPREHENSIVE METABOLIC PANEL
ALT: 17 U/L (ref 0–53)
AST: 37 U/L (ref 0–37)
Albumin: 4.3 g/dL (ref 3.5–5.2)
Alkaline Phosphatase: 64 U/L (ref 39–117)
BUN: 24 mg/dL — ABNORMAL HIGH (ref 6–23)
CO2: 31 mEq/L (ref 19–32)
Calcium: 9.6 mg/dL (ref 8.4–10.5)
Chloride: 99 mEq/L (ref 96–112)
Creatinine, Ser: 1 mg/dL (ref 0.40–1.50)
GFR: 77.6 mL/min (ref >60.00)
Glucose, Bld: 124 mg/dL — ABNORMAL HIGH (ref 70–99)
Potassium: 4.7 mEq/L (ref 3.5–5.1)
Sodium: 136 mEq/L (ref 135–145)
Total Bilirubin: 1.1 mg/dL (ref 0.2–1.2)
Total Protein: 7 g/dL (ref 6.0–8.3)

## 2017-10-31 LAB — CBC
HCT: 45.5 % (ref 39.0–52.0)
Hemoglobin: 15.3 g/dL (ref 13.0–17.0)
MCHC: 33.6 g/dL (ref 30.0–36.0)
MCV: 97.2 fl (ref 78.0–100.0)
Platelets: 367 10*3/uL (ref 150.0–400.0)
RBC: 4.69 Mil/uL (ref 4.22–5.81)
RDW: 15 % (ref 11.5–15.5)
WBC: 7.7 10*3/uL (ref 4.0–10.5)

## 2017-10-31 LAB — LDL CHOLESTEROL, DIRECT: Direct LDL: 64 mg/dL

## 2017-10-31 LAB — LIPID PANEL
Cholesterol: 156 mg/dL (ref 0–200)
HDL: 80.8 mg/dL (ref >39.00)
NonHDL: 75.13
Total CHOL/HDL Ratio: 2
Triglycerides: 251 mg/dL — ABNORMAL HIGH (ref 0.0–149.0)
VLDL: 50.2 mg/dL — ABNORMAL HIGH (ref 0.0–40.0)

## 2017-10-31 LAB — TSH: TSH: 3.84 u[IU]/mL (ref 0.35–4.50)

## 2017-10-31 LAB — TESTOSTERONE: Testosterone: 126.66 ng/dL — ABNORMAL LOW (ref 300.00–890.00)

## 2017-10-31 LAB — HEMOGLOBIN A1C: Hgb A1c MFr Bld: 6.6 % — ABNORMAL HIGH (ref 4.6–6.5)

## 2017-11-01 MED ORDER — TESTOSTERONE 25 MG/2.5GM (1%) TD GEL: 50.0000 mg | Freq: Every morning | TRANSDERMAL | 3 refills | Status: DC

## 2017-11-15 ENCOUNTER — Other Ambulatory Visit: Payer: Self-pay | Admitting: Internal Medicine

## 2017-11-20 ENCOUNTER — Telehealth: Payer: Self-pay | Admitting: Family Medicine

## 2017-11-20 NOTE — Telephone Encounter (Signed)
Requesting:valium Contract:yes UDS:low risk  Last SN:[id number] one  Next OV:10/30/17 Last Refill:07/12/17  #[id number] Database:   Please advise

## 2017-11-20 NOTE — Telephone Encounter (Signed)
Copied from [place] [id number]. Topic: Inquiry >> Nov 20, 2017 12:20 PM Pricilla Handler wrote: Reason for CRM: Patient called requesting a refill of Diazepam (VALIUM) 10 MG tablet. Patient contacted his pharmacy, but they have no refills. Patient's preferred pharmacy is [id number] - [place], [place] [phone] (Phone)  [phone] (Fax).       Thank You!!!

## 2017-11-20 NOTE — Telephone Encounter (Signed)
Medication filled on 11/20/17

## 2017-11-20 NOTE — Telephone Encounter (Signed)
Do you mean next OV 5/15? I refilled x 30 days but needs an appt

## 2017-11-21 NOTE — Telephone Encounter (Signed)
Don't know how that was mixed up  Last OV 10/30/17 Next OV 01/25/18 He will need UDS at his next refill

## 2017-11-23 ENCOUNTER — Ambulatory Visit (INDEPENDENT_AMBULATORY_CARE_PROVIDER_SITE_OTHER): Payer: Medicare Other | Admitting: Specialist

## 2017-11-23 ENCOUNTER — Encounter (INDEPENDENT_AMBULATORY_CARE_PROVIDER_SITE_OTHER): Payer: Self-pay | Admitting: Specialist

## 2017-11-23 ENCOUNTER — Other Ambulatory Visit (INDEPENDENT_AMBULATORY_CARE_PROVIDER_SITE_OTHER): Payer: Self-pay | Admitting: Specialist

## 2017-11-23 ENCOUNTER — Other Ambulatory Visit: Payer: Self-pay | Admitting: Family Medicine

## 2017-11-23 VITALS — BP 153/91 | HR 71 | Ht 70.0 in | Wt 201.0 lb

## 2017-11-23 DIAGNOSIS — M4325 Fusion of spine, thoracolumbar region: Secondary | ICD-10-CM | POA: Diagnosis not present

## 2017-11-23 MED ORDER — CELECOXIB 200 MG PO CAPS: 200.0000 mg | ORAL_CAPSULE | Freq: Every day | ORAL | 0 refills | Status: DC

## 2017-11-23 MED ORDER — TAMSULOSIN HCL 0.4 MG PO CAPS: 0.8000 mg | ORAL_CAPSULE | Freq: Every day | ORAL | 1 refills | Status: DC

## 2017-11-23 NOTE — Telephone Encounter (Signed)
Celecoxib Refill Request

## 2017-11-23 NOTE — Patient Instructions (Addendum)
Avoid frequent bending and stooping  No lifting greater than 25 lbs. May use ice or moist heat for pain. Weight loss is of benefit.

## 2017-11-23 NOTE — Progress Notes (Signed)
Office Visit Note   Patient: Luis Brown           Date of Birth: 02-19-1946           MRN: [id number] Visit Date: 11/23/2017              Requested by: [name], MD [address] [place], [place] [address] PCP: [name], MD   Assessment & Plan: Visit Diagnoses:  1. Fusion of spine of thoracolumbar region     Plan: Avoid frequent bending and stooping  No lifting greater than 25 lbs. May use ice or moist heat for pain. Weight loss is of benefit.   Follow-Up Instructions: Return in about 3 months (around 02/23/2018).   Orders:  No orders of the defined types were placed in this encounter.  No orders of the defined types were placed in this encounter.     Procedures: No procedures performed   Clinical Data: No additional findings.   Subjective: Chief Complaint  Patient presents with  . 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 No pain complaints he is standing and walking And would like to do more bending and yard work. He is standing and walking well. Pain in the left buttock and left leg is much better. Still with numbness left L5.    Review of Systems  Constitutional: Negative.   HENT: Negative.   Eyes: Negative.   Respiratory: Negative.   Cardiovascular: Negative.   Gastrointestinal: Negative.   Endocrine: Negative.   Genitourinary: Negative.   Musculoskeletal: Negative.   Skin: Negative.   Allergic/Immunologic: Negative.   Neurological: Negative.   Hematological: Negative.   Psychiatric/Behavioral: Negative.      Objective: Vital Signs: BP (!) 153/91   Pulse 71   Ht 5\' 10"  (1.778 m)   Wt 201 lb (91.2 kg)   BMI 28.84 kg/m   Physical Exam  Constitutional: He is oriented to person, place, and time. He appears well-developed and well-nourished.  HENT:  Head: Normocephalic and atraumatic.    Eyes: Pupils are equal, round, and reactive to light. EOM are normal.  Neck: Normal range of motion. Neck supple.  Pulmonary/Chest: Effort normal and breath sounds normal.  Abdominal: Soft. Bowel sounds are normal.  Neurological: He is alert and oriented to person, place, and time.  Skin: Skin is warm and dry.  Psychiatric: He has a normal mood and affect. His behavior is normal. Judgment and thought content normal.    Back Exam   Tenderness  The patient is experiencing tenderness in the lumbar.  Range of Motion  Extension: abnormal  Flexion: abnormal  Lateral bend right: abnormal  Lateral bend left: abnormal  Rotation right: abnormal  Rotation left: abnormal   Muscle Strength  Right Quadriceps:  5/5  Left Quadriceps:  5/5  Right Hamstrings:  5/5  Left Hamstrings:  5/5   Tests  Straight leg raise right: negative Straight leg raise left: negative  Reflexes  Patellar: Hyporeflexic Achilles: Hyporeflexic Babinski's sign: normal   Other  Toe walk: normal Heel walk: normal Sensation: normal Gait: abnormal  Erythema: no back redness Scars: present      Specialty Comments:  No specialty comments available.  Imaging: No results found.   PMFS History: Patient Active Problem List   Diagnosis Date Noted  . Spinal stenosis of lumbar region  with neurogenic claudication 05/23/2017  . Thoracic stenosis 04/17/2017  . Painful orthopaedic hardware ([place]) 04/17/2017  . 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 ([place]) 04/17/2017  . Low back pain radiating to both legs 01/16/2017  . Esophageal reflux 02/10/2015  . Medicare annual wellness visit, subsequent 02/10/2015  . Personal history of colonic polyps 10/27/2012  . 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  . BPH (benign prostatic hyperplasia) 04/23/2012  . ED (erectile dysfunction) 04/23/2012  . Allergic state 10/25/2011  . Anxiety and depression 10/25/2011  . Asthma   .  Hyperglycemia   . Hyperlipidemia   . Overweight   . Insomnia   . Fatigue   . Preventative health care   . DDD (degenerative disc disease), lumbosacral   . HTN (hypertension)    Past Medical History:  Diagnosis Date  . Acute pharyngitis 10/21/2013  . Allergy    grass and pollen  . Anxiety and depression 10/25/2011  . BPH (benign prostatic hyperplasia) 04/23/2012  . Chicken pox as a child  . DDD (degenerative disc disease)    cervical responds to steroid injections and low back required surgery  . DDD (degenerative disc disease), lumbosacral   . Diabetes mellitus    pre  . ED (erectile dysfunction) 04/23/2012  . Elevated BP   . Esophageal reflux 02/10/2015  . Fatigue   . HTN (hypertension)   . Hyperglycemia    preDM   . Hyperlipidemia   . Insomnia   . Low back pain radiating to both legs 01/16/2017  . Measles as a child  . Medicare annual wellness visit, subsequent 02/10/2015  . Overweight(278.02)   . Personal history of colonic polyps 10/27/2012   Follows with [facility]  . Preventative health care   . 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    Family History  Problem Relation Age of Onset  . Hypertension Mother   . Diabetes Mother        type 2  . Cancer Mother 66       breast in remission  . Emphysema Father        smoker  . COPD Father        smoker  . Stroke Father 41       mini  . Heart disease Father   . Hypertension Sister   . Hyperlipidemia Sister   . Scoliosis Sister   . Osteoporosis Sister   . Hypertension Maternal Grandmother   . Scoliosis Maternal Grandmother   . Heart disease Maternal Grandfather   . Heart disease Paternal Grandfather        smoker  . Heart disease Daughter     Past Surgical History:  Procedure Laterality Date  . BACK SURGERY  2012 and 1994   Dr Margret Chance, screws and cage in low back  . TONSILLECTOMY    . torn rotator cuff  2010   right   Social History   Occupational History  . Not on file  Tobacco Use  . Smoking  status: Former Smoker    Packs/day: 1.00    Years: 20.00    Pack years: 20.00    Types: Cigarettes    Last attempt to quit: 07/18/1989    Years since quitting: 28.3  . Smokeless tobacco: Never Used  . Tobacco comment: off and on smoking for the 20 yrs  Substance and Sexual Activity  . Alcohol use: Yes    Comment: drink or two a day- mixed drink  . Drug use: No  .  Sexual activity: Yes    Comment: lives with wife, still working, no dietary restrictions, continues to exercise intermittently

## 2017-11-23 NOTE — Telephone Encounter (Signed)
Patient would like a Rx refill on Celebrex 200mg .  Please advise.  Thank You.

## 2017-11-23 NOTE — Telephone Encounter (Signed)
Rx approved.  Rx sent to patient's pharmacy.

## 2017-11-24 ENCOUNTER — Other Ambulatory Visit (INDEPENDENT_AMBULATORY_CARE_PROVIDER_SITE_OTHER): Payer: Self-pay | Admitting: Specialist

## 2017-11-24 NOTE — Telephone Encounter (Signed)
Tramadol refill request 

## 2017-11-28 ENCOUNTER — Other Ambulatory Visit (INDEPENDENT_AMBULATORY_CARE_PROVIDER_SITE_OTHER): Payer: Self-pay | Admitting: Specialist

## 2017-11-28 NOTE — Telephone Encounter (Signed)
[facility] 

## 2017-11-30 ENCOUNTER — Encounter: Payer: Self-pay | Admitting: Family Medicine

## 2017-11-30 ENCOUNTER — Other Ambulatory Visit: Payer: Self-pay | Admitting: Family Medicine

## 2017-12-06 MED ORDER — ACYCLOVIR 200 MG PO CAPS
ORAL_CAPSULE | ORAL | 1 refills | Status: DC
Start: 2017-12-06 — End: 2018-04-13

## 2017-12-06 NOTE — Telephone Encounter (Signed)
Patient is calling to check on the status of getting acyclovir (ZOVIRAX) 200 MG capsule refilled. Please advise.

## 2017-12-07 ENCOUNTER — Other Ambulatory Visit: Payer: Self-pay

## 2017-12-07 DIAGNOSIS — K219 Gastro-esophageal reflux disease without esophagitis: Secondary | ICD-10-CM

## 2017-12-07 MED ORDER — RANITIDINE HCL 300 MG PO TABS: 300.0000 mg | ORAL_TABLET | Freq: Every day | ORAL | 0 refills | Status: DC

## 2017-12-19 ENCOUNTER — Telehealth: Payer: Self-pay | Admitting: Family Medicine

## 2017-12-19 NOTE — Telephone Encounter (Signed)
Copied from [place] [id number]. Topic: Quick Communication - See Telephone Encounter >> Dec 19, 2017 10:50 AM Aurelio Brash B wrote: CRM for notification. See Telephone encounter for: 12/19/17. PT states he is finished with Testosterone (ANDROGEL) 25 MG/2.5GM (1%) GEL   and wants to know what to do next.  He is asking for a call back from nurse

## 2017-12-20 NOTE — Telephone Encounter (Signed)
Please check in with him and see if he needs a refill or if he needs [place] to switch him due to insurance. His testosterone was very low in April so he needs to take something

## 2017-12-20 NOTE — Telephone Encounter (Signed)
Please advise 

## 2017-12-21 NOTE — Telephone Encounter (Signed)
Pt states he would like a refill of the gel.    [facility] [phone]  Pharmacy Address and Hours   Address Hours  [place] [address] None Available

## 2017-12-23 ENCOUNTER — Encounter: Payer: Self-pay | Admitting: Family Medicine

## 2017-12-25 NOTE — Telephone Encounter (Signed)
Please arrange a refill on the gel and repeat lab work next month to monitor

## 2017-12-27 ENCOUNTER — Telehealth: Payer: Self-pay | Admitting: Family Medicine

## 2017-12-27 NOTE — Telephone Encounter (Signed)
Copied from [place] [id number]. Topic: Quick Communication - See Telephone Encounter >> Dec 19, 2017 10:50 AM Aurelio Brash B wrote: CRM for notification. See Telephone encounter for: 12/19/17. PT states he is finished with Testosterone (ANDROGEL) 25 MG/2.5GM (1%) GEL   and wants to know what to do next.  He is asking for a call back from nurse >> Dec 27, 2017  4:21 PM Cleaster Corin, [place] wrote: Pt. Calling back to check status of med. Refill Testosterone (ANDROGEL) 25 MG/2.5GM (1%) GEL [[id number]]     [id number] [address]  [place] [address]  Phone: [phone] Fax: [phone]

## 2017-12-28 ENCOUNTER — Other Ambulatory Visit: Payer: Self-pay

## 2017-12-28 DIAGNOSIS — E349 Endocrine disorder, unspecified: Secondary | ICD-10-CM

## 2017-12-28 MED ORDER — TESTOSTERONE 25 MG/2.5GM (1%) TD GEL: 50.0000 mg | Freq: Every morning | TRANSDERMAL | 3 refills | Status: DC

## 2017-12-28 NOTE — Telephone Encounter (Signed)
Refill sent to pharmacy. Pt notified by MyChart. Waiting for pt's response to schedule lab only appt per Dr. [name].

## 2017-12-28 NOTE — Telephone Encounter (Signed)
Refill sent to pt's pharmacy, lab order placed per Dr. [name]. Sent pt a MyChart message to inform and to find out what day and time in July will be best for him to come for a lab only appointment to recheck testosterone level per Dr. [name].

## 2017-12-29 ENCOUNTER — Ambulatory Visit: Payer: Medicare Other

## 2018-01-15 ENCOUNTER — Other Ambulatory Visit: Payer: Self-pay | Admitting: Family Medicine

## 2018-01-16 DIAGNOSIS — K0889 Other specified disorders of teeth and supporting structures: Secondary | ICD-10-CM | POA: Diagnosis not present

## 2018-01-16 NOTE — Telephone Encounter (Signed)
Requesting: diazepam Contract: will get at 01/25/18 visit UDS: will get at 01/25/18 visit Last OV: 10/20/17 Next OV: 01/25/18 Last Refill: 11/20/17 Database: no discrepancies   Please advise

## 2018-01-25 ENCOUNTER — Encounter: Payer: Self-pay | Admitting: Family Medicine

## 2018-01-25 ENCOUNTER — Ambulatory Visit (INDEPENDENT_AMBULATORY_CARE_PROVIDER_SITE_OTHER): Payer: Medicare Other | Admitting: Family Medicine

## 2018-01-25 VITALS — BP 122/60 | HR 85 | Temp 98.5°F | Resp 18 | Ht 70.0 in | Wt 202.4 lb

## 2018-01-25 DIAGNOSIS — E349 Endocrine disorder, unspecified: Secondary | ICD-10-CM | POA: Diagnosis not present

## 2018-01-25 DIAGNOSIS — N400 Enlarged prostate without lower urinary tract symptoms: Secondary | ICD-10-CM | POA: Diagnosis not present

## 2018-01-25 DIAGNOSIS — R739 Hyperglycemia, unspecified: Secondary | ICD-10-CM

## 2018-01-25 DIAGNOSIS — I10 Essential (primary) hypertension: Secondary | ICD-10-CM

## 2018-01-25 DIAGNOSIS — Z0000 Encounter for general adult medical examination without abnormal findings: Secondary | ICD-10-CM | POA: Diagnosis not present

## 2018-01-25 DIAGNOSIS — E782 Mixed hyperlipidemia: Secondary | ICD-10-CM

## 2018-01-25 DIAGNOSIS — Z1211 Encounter for screening for malignant neoplasm of colon: Secondary | ICD-10-CM

## 2018-01-25 DIAGNOSIS — Z79899 Other long term (current) drug therapy: Secondary | ICD-10-CM

## 2018-01-25 DIAGNOSIS — N529 Male erectile dysfunction, unspecified: Secondary | ICD-10-CM | POA: Diagnosis not present

## 2018-01-25 NOTE — Assessment & Plan Note (Signed)
Well controlled, no changes to meds. Encouraged heart healthy diet such as the DASH diet and exercise as tolerated.  °

## 2018-01-25 NOTE — Assessment & Plan Note (Addendum)
Check level and PSA check and continue to supplement. Feels better since supplementing. Less tired

## 2018-01-25 NOTE — Assessment & Plan Note (Signed)
Encouraged heart healthy diet, increase exercise, avoid trans fats, consider a krill oil cap daily 

## 2018-01-25 NOTE — Progress Notes (Signed)
Subjective:  I acted as a Education administrator for Dr. [name]. Luis Brown, [place]  [name], male    DOB: 07-12-1946, 72 y.o.   MRN: [id number]  No chief complaint on file.   HPI  Patient is in today for an annual exam and follow up on chronic medical concerns including hypertension, hyperglycemia and hyperlipidemia. His back continues to improve after his surgery with Dr Louanne Skye. No new concerns. He hopes that Dr Louanne Skye will release him to full activity in November. Is active as able. Tries to maintain a heart healthy diet. No difficulties with Activities of Daily Living. Denies CP/palp/SOB/HA/congestion/fevers/GI or GU c/o. Taking meds as prescribed. No polyuria or polydipsia. Fatigue is improved with testosterone treatment  Patient Care Team: [name], MD as PCP - General (Family Medicine) Keene Breath., MD (Ophthalmology) Jessy Oto, MD as Consulting Physician (Orthopedic Surgery)   Past Medical History:  Diagnosis Date  . Acute pharyngitis 10/21/2013  . Allergy    grass and pollen  . Anxiety and depression 10/25/2011  . BPH (benign prostatic hyperplasia) 04/23/2012  . Chicken pox as a child  . DDD (degenerative disc disease)    cervical responds to steroid injections and low back required surgery  . DDD (degenerative disc disease), lumbosacral   . Diabetes mellitus    pre  . ED (erectile dysfunction) 04/23/2012  . Elevated BP   . Esophageal reflux 02/10/2015  . Fatigue   . HTN (hypertension)   . Hyperglycemia    preDM   . Hyperlipidemia   . Insomnia   . Low back pain radiating to both legs 01/16/2017  . Measles as a child  . Medicare annual wellness visit, subsequent 02/10/2015  . Overweight(278.02)   . Personal history of colonic polyps 10/27/2012   Follows with [facility]  . Preventative health care   . 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    Past Surgical History:  Procedure Laterality Date  . BACK SURGERY  2012 and 1994   Dr Margret Chance, screws and cage  in low back  . TONSILLECTOMY    . torn rotator cuff  2010   right    Family History  Problem Relation Age of Onset  . Hypertension Mother   . Diabetes Mother        type 2  . Cancer Mother 90       breast in remission  . Emphysema Father        smoker  . COPD Father        smoker  . Stroke Father 23       mini  . Heart disease Father   . Hypertension Sister   . Hyperlipidemia Sister   . Scoliosis Sister   . Osteoporosis Sister   . Hypertension Maternal Grandmother   . Scoliosis Maternal Grandmother   . Heart disease Maternal Grandfather   . Heart disease Paternal Grandfather        smoker  . Heart disease Daughter     Social History   Socioeconomic History  . Marital status: Married    Spouse name: Not on file  . Number of children: Not on file  . Years of education: Not on file  . Highest education level: Not on file  Occupational History  . Not on file  Social Needs  . Financial resource strain: Not on file  . Food insecurity:    Worry: Not on file    Inability: Not on file  . Transportation needs:  Medical: Not on file    Non-medical: Not on file  Tobacco Use  . Smoking status: Former Smoker    Packs/day: 1.00    Years: 20.00    Pack years: 20.00    Types: Cigarettes    Last attempt to quit: 07/18/1989    Years since quitting: 28.5  . Smokeless tobacco: Never Used  . Tobacco comment: off and on smoking for the 20 yrs  Substance and Sexual Activity  . Alcohol use: Yes    Comment: drink or two a day- mixed drink  . Drug use: No  . Sexual activity: Yes    Comment: lives with wife, still working, no dietary restrictions, continues to exercise intermittently  Lifestyle  . Physical activity:    Days per week: Not on file    Minutes per session: Not on file  . Stress: Not on file  Relationships  . Social connections:    Talks on phone: Not on file    Gets together: Not on file    Attends religious service: Not on file    Active member of club or  organization: Not on file    Attends meetings of clubs or organizations: Not on file    Relationship status: Not on file  . Intimate partner violence:    Fear of current or ex partner: Not on file    Emotionally abused: Not on file    Physically abused: Not on file    Forced sexual activity: Not on file  Other Topics Concern  . Not on file  Social History Narrative  . Not on file    Outpatient Medications Prior to Visit  Medication Sig Dispense Refill  . acyclovir (ZOVIRAX) 200 MG capsule TAKE ONE CAPSULE BY MOUTH 5 TIMES A DAY 25 capsule 1  . aspirin 81 MG tablet Take 81 mg by mouth daily.    [place] atorvastatin (LIPITOR) 20 MG tablet TAKE 1/2 TABLET BY MOUTH DAILY AT 6 PM (Patient taking differently: Take 10 mg by mouth daily at 6 PM. TAKE 1/2 TABLET BY MOUTH DAILY AT 6 PM) 90 tablet 1  . b complex vitamins tablet Take 1 tablet by mouth daily.    . celecoxib (CELEBREX) 200 MG capsule Take 1 capsule (200 mg total) by mouth daily. 90 capsule 0  . diazepam (VALIUM) 10 MG tablet TAKE ONE TABLET BY MOUTH EVERY DAY AT BEDTIME AS NEEDED 30 tablet 0  . gabapentin (NEURONTIN) 300 MG capsule Take 1 capsule (300 mg total) 3 (three) times daily by mouth. 60 capsule 1  . glucose blood (ONE TOUCH ULTRA TEST) test strip Check daily 32 each 1  . HYDROcodone-acetaminophen (NORCO) 7.5-325 MG tablet Take 1-2 tablets by mouth every 6 (six) hours as needed for moderate pain. 50 tablet 0  . lisinopril (PRINIVIL,ZESTRIL) 5 MG tablet Take 1 tablet (5 mg total) by mouth daily. 90 tablet 1  . metFORMIN (GLUCOPHAGE) 500 MG tablet Take 1 tablet (500 mg total) by mouth daily. 90 tablet 1  . methocarbamol (ROBAXIN) 500 MG tablet TAKE 1 TABLET BY MOUTH EVERY 8 HOURS AS NEEDED FOR MUSCLE SPASM 50 tablet 0  . methocarbamol (ROBAXIN) 500 MG tablet Take 1 tablet (500 mg total) every 6 (six) hours as needed by mouth for muscle spasms. 40 tablet 1  . PROAIR HFA 108 (90 Base) MCG/ACT inhaler TAKE 1-2 PUFFS INHALE THREE TIMES A  DAY (Patient taking differently: TAKE 1-2 PUFFS INHALE THREE TIMES A DAY AS NEEDED FOR SHORTNESS OF BREATH)  8.5 Inhaler 0  . ranitidine (ZANTAC) 300 MG tablet Take 1 tablet (300 mg total) by mouth daily. 90 tablet 0  . sildenafil (REVATIO) 20 MG tablet TAKE ONE TO THREE TABLETS BY MOUTH DAILY AS NEEDED 50 tablet 2  . tamsulosin (FLOMAX) 0.4 MG CAPS capsule Take 2 capsules (0.8 mg total) by mouth daily. 180 capsule 1  . Testosterone (ANDROGEL) 25 MG/2.5GM (1%) GEL Place 50 mg onto the skin every morning. 12.5 g 3  . traMADol (ULTRAM) 50 MG tablet TAKE 1/2 TO 1 TABLET 4 TIMES A DAY AS NEEDED 120 tablet 0  . venlafaxine XR (EFFEXOR-XR) 150 MG 24 hr capsule Take 1 capsule (150 mg total) by mouth daily with breakfast. 90 capsule 1  . cetirizine (ZYRTEC) 10 MG tablet Take 1 tablet (10 mg total) by mouth daily as needed for allergies or rhinitis. 30 tablet 11  . fluticasone (FLONASE) 50 MCG/ACT nasal spray Place 2 sprays into the nose daily. 48 g 1   Facility-Administered Medications Prior to Visit  Medication Dose Route Frequency Provider Last Rate Last Dose  . testosterone cypionate (DEPOTESTOSTERONE CYPIONATE) injection 200 mg  200 mg Intramuscular Q14 Days Penni Homans A, MD   200 mg at 10/10/17 1120    No Known Allergies  Review of Systems  Constitutional: Negative for chills, fever and malaise/fatigue.  HENT: Negative for congestion and hearing loss.   Eyes: Negative for discharge.  Respiratory: Negative for cough, sputum production and shortness of breath.   Cardiovascular: Negative for chest pain, palpitations and leg swelling.  Gastrointestinal: Negative for abdominal pain, blood in stool, constipation, diarrhea, heartburn, nausea and vomiting.  Genitourinary: Negative for dysuria, frequency, hematuria and urgency.  Musculoskeletal: Positive for back pain. Negative for falls and myalgias.  Skin: Negative for rash.  Neurological: Negative for dizziness, sensory change, loss of  consciousness, weakness and headaches.  Endo/Heme/Allergies: Negative for environmental allergies. Does not bruise/bleed easily.  Psychiatric/Behavioral: Negative for depression and suicidal ideas. The patient is not nervous/anxious and does not have insomnia.        Objective:    Physical Exam  Constitutional: He is oriented to person, place, and time. He appears well-developed and well-nourished. No distress.  HENT:  Head: Normocephalic and atraumatic.  Eyes: Conjunctivae are normal.  Neck: Neck supple. No thyromegaly present.  Cardiovascular: Normal rate, regular rhythm and normal heart sounds.  No murmur heard. Pulmonary/Chest: Effort normal and breath sounds normal. No respiratory distress. He has no wheezes.  Abdominal: Soft. Bowel sounds are normal. He exhibits no mass. There is no tenderness.  Musculoskeletal: He exhibits no edema.  Lymphadenopathy:    He has no cervical adenopathy.  Neurological: He is alert and oriented to person, place, and time.  Skin: Skin is warm and dry.  Psychiatric: He has a normal mood and affect. His behavior is normal.    BP 122/60 (BP Location: Left Arm, Patient Position: Sitting, Cuff Size: Normal)   Pulse 85   Temp 98.5 F (36.9 C) (Oral)   Resp 18   Ht 5\' 10"  (1.778 m)   Wt 202 lb 6.4 oz (91.8 kg)   SpO2 97%   BMI 29.04 kg/m  Wt Readings from Last 3 Encounters:  01/25/18 202 lb 6.4 oz (91.8 kg)  11/23/17 201 lb (91.2 kg)  10/30/17 200 lb 3.2 oz (90.8 kg)   BP Readings from Last 3 Encounters:  01/25/18 122/60  11/23/17 (!) 153/91  10/30/17 (!) 145/76     Immunization History  Administered  Date(s) Administered  . Influenza Split 04/23/2012  . Influenza Whole 02/16/2011  . Influenza, High Dose Seasonal PF 05/06/2013, 03/25/2016, 05/05/2017  . Pneumococcal Conjugate-13 03/25/2016  . Pneumococcal Polysaccharide-23 10/19/2011  . Td 07/18/2008  . Zoster 04/27/2016    Health Maintenance  Topic Date Due  . OPHTHALMOLOGY EXAM   04/17/2017  . FOOT EXAM  08/23/2017  . INFLUENZA VACCINE  02/15/2018  . HEMOGLOBIN A1C  05/01/2018  . TETANUS/TDAP  07/18/2018  . COLONOSCOPY  09/05/2022  . Hepatitis C Screening  Completed  . PNA vac Low Risk Adult  Completed    Lab Results  Component Value Date   WBC 7.7 10/30/2017   HGB 15.3 10/30/2017   HCT 45.5 10/30/2017   PLT 367.0 10/30/2017   GLUCOSE 124 (H) 10/30/2017   CHOL 156 10/30/2017   TRIG 251.0 (H) 10/30/2017   HDL 80.80 10/30/2017   LDLDIRECT 64.0 10/30/2017   LDLCALC 55 01/16/2017   ALT 17 10/30/2017   AST 37 10/30/2017   NA 136 10/30/2017   K 4.7 10/30/2017   CL 99 10/30/2017   CREATININE 1.00 10/30/2017   BUN 24 (H) 10/30/2017   CO2 31 10/30/2017   TSH 3.84 10/30/2017   PSA 0.32 01/16/2017   INR 1.00 05/17/2017   HGBA1C 6.6 (H) 10/30/2017   MICROALBUR 0.61 04/23/2012    Lab Results  Component Value Date   TSH 3.84 10/30/2017   Lab Results  Component Value Date   WBC 7.7 10/30/2017   HGB 15.3 10/30/2017   HCT 45.5 10/30/2017   MCV 97.2 10/30/2017   PLT 367.0 10/30/2017   Lab Results  Component Value Date   NA 136 10/30/2017   K 4.7 10/30/2017   CO2 31 10/30/2017   GLUCOSE 124 (H) 10/30/2017   BUN 24 (H) 10/30/2017   CREATININE 1.00 10/30/2017   BILITOT 1.1 10/30/2017   ALKPHOS 64 10/30/2017   AST 37 10/30/2017   ALT 17 10/30/2017   PROT 7.0 10/30/2017   ALBUMIN 4.3 10/30/2017   CALCIUM 9.6 10/30/2017   ANIONGAP 7 05/24/2017   GFR 77.60 10/30/2017   Lab Results  Component Value Date   CHOL 156 10/30/2017   Lab Results  Component Value Date   HDL 80.80 10/30/2017   Lab Results  Component Value Date   LDLCALC 55 01/16/2017   Lab Results  Component Value Date   TRIG 251.0 (H) 10/30/2017   Lab Results  Component Value Date   CHOLHDL 2 10/30/2017   Lab Results  Component Value Date   HGBA1C 6.6 (H) 10/30/2017         Assessment & Plan:   Problem List Items Addressed This Visit    HTN (hypertension)     Well controlled, no changes to meds. Encouraged heart healthy diet such as the DASH diet and exercise as tolerated.       Relevant Orders   CBC   Comprehensive metabolic panel   TSH   Hyperglycemia    hgba1c acceptable, minimize simple carbs. Increase exercise as tolerated.       Relevant Orders   Hemoglobin A1c   Hyperlipidemia    Encouraged heart healthy diet, increase exercise, avoid trans fats, consider a krill oil cap daily      Relevant Orders   Lipid panel   Preventative health care    Patient encouraged to maintain heart healthy diet, regular exercise, adequate sleep. Consider daily probiotics. Take medications as prescribed. Given and reviewed copy of ACP documents from [facility]  Secretary of State and encouraged to complete and return. Labs reviewed      BPH (benign prostatic hyperplasia)    Check psa today      ED (erectile dysfunction)   Relevant Orders   PSA   Testosterone deficiency    Check level and PSA check and continue to supplement. Feels better since supplementing. Less tired      Relevant Orders   Testosterone   PSA    Other Visit Diagnoses    High risk medication use    -  Primary   Relevant Orders   Pain Mgmt, Profile 8 w/Conf, U   Colon cancer screening       Relevant Orders   Ambulatory referral to Gastroenterology      I am having Luis Brown maintain his aspirin, b complex vitamins, cetirizine, fluticasone, glucose blood, PROAIR HFA, methocarbamol, atorvastatin, gabapentin, methocarbamol, HYDROcodone-acetaminophen, lisinopril, metFORMIN, venlafaxine XR, celecoxib, tamsulosin, traMADol, sildenafil, acyclovir, ranitidine, Testosterone, and diazepam. We will continue to administer testosterone cypionate.  No orders of the defined types were placed in this encounter.   CMA served as Education administrator during this visit. History, Physical and Plan performed by medical provider. Documentation and orders reviewed and attested to.  Penni Homans, MD

## 2018-01-25 NOTE — Assessment & Plan Note (Signed)
hgba1c acceptable, minimize simple carbs. Increase exercise as tolerated.  

## 2018-01-25 NOTE — Patient Instructions (Addendum)
Shingrix is the new shingles shot, 2 shots over 2-6 months. At pharmacy   Preventive Care 74 Years and Older, Male Preventive care refers to lifestyle choices and visits with your health care provider that can promote health and wellness. What does preventive care include?  A yearly physical exam. This is also called an annual well check.  Dental exams once or twice a year.  Routine eye exams. Ask your health care provider how often you should have your eyes checked.  Personal lifestyle choices, including: ? Daily care of your teeth and gums. ? Regular physical activity. ? Eating a healthy diet. ? Avoiding tobacco and drug use. ? Limiting alcohol use. ? Practicing safe sex. ? Taking low doses of aspirin every day. ? Taking vitamin and mineral supplements as recommended by your health care provider. What happens during an annual well check? The services and screenings done by your health care provider during your annual well check will depend on your age, overall health, lifestyle risk factors, and family history of disease. Counseling Your health care provider may ask you questions about your:  Alcohol use.  Tobacco use.  Drug use.  Emotional well-being.  Home and relationship well-being.  Sexual activity.  Eating habits.  History of falls.  Memory and ability to understand (cognition).  Work and work Statistician.  Screening You may have the following tests or measurements:  Height, weight, and BMI.  Blood pressure.  Lipid and cholesterol levels. These may be checked every 5 years, or more frequently if you are over 74 years old.  Skin check.  Lung cancer screening. You may have this screening every year starting at age 74 if you have a 30-pack-year history of smoking and currently smoke or have quit within the past 15 years.  Fecal occult blood test (FOBT) of the stool. You may have this test every year starting at age 74.  Flexible sigmoidoscopy or  colonoscopy. You may have a sigmoidoscopy every 5 years or a colonoscopy every 10 years starting at age 74.  Prostate cancer screening. Recommendations will vary depending on your family history and other risks.  Hepatitis C blood test.  Hepatitis B blood test.  Sexually transmitted disease (STD) testing.  Diabetes screening. This is done by checking your blood sugar (glucose) after you have not eaten for a while (fasting). You may have this done every 1-3 years.  Abdominal aortic aneurysm (AAA) screening. You may need this if you are a current or former smoker.  Osteoporosis. You may be screened starting at age 74 if you are at high risk.  Talk with your health care provider about your test results, treatment options, and if necessary, the need for more tests. Vaccines Your health care provider may recommend certain vaccines, such as:  Influenza vaccine. This is recommended every year.  Tetanus, diphtheria, and acellular pertussis (Tdap, Td) vaccine. You may need a Td booster every 10 years.  Varicella vaccine. You may need this if you have not been vaccinated.  Zoster vaccine. You may need this after age 74.  Measles, mumps, and rubella (MMR) vaccine. You may need at least one dose of MMR if you were born in 1957 or later. You may also need a second dose.  Pneumococcal 13-valent conjugate (PCV13) vaccine. One dose is recommended after age 74.  Pneumococcal polysaccharide (PPSV23) vaccine. One dose is recommended after age 74.  Meningococcal vaccine. You may need this if you have certain conditions.  Hepatitis A vaccine. You may need this  if you have certain conditions or if you travel or work in places where you may be exposed to hepatitis A.  Hepatitis B vaccine. You may need this if you have certain conditions or if you travel or work in places where you may be exposed to hepatitis B.  Haemophilus influenzae type b (Hib) vaccine. You may need this if you have certain risk  factors.  Talk to your health care provider about which screenings and vaccines you need and how often you need them. This information is not intended to replace advice given to you by your health care provider. Make sure you discuss any questions you have with your health care provider. Document Released: 07/31/2015 Document Revised: 03/23/2016 Document Reviewed: 05/05/2015 Elsevier Interactive Patient Education  [employer].

## 2018-01-25 NOTE — Assessment & Plan Note (Signed)
>>  ASSESSMENT AND PLAN FOR TESTOSTERONE DEFICIENCY WRITTEN ON 01/25/2018  1:51 PM BY BLYTH, STACEY A, MD  Check level and PSA check and continue to supplement. Feels better since supplementing. Less tired

## 2018-01-25 NOTE — Assessment & Plan Note (Signed)
Check psa today

## 2018-01-25 NOTE — Assessment & Plan Note (Signed)
Patient encouraged to maintain heart healthy diet, regular exercise, adequate sleep. Consider daily probiotics. Take medications as prescribed. Given and reviewed copy of ACP documents from [place] Secretary of State and encouraged to complete and return. Labs reviewed.  

## 2018-01-26 ENCOUNTER — Telehealth: Payer: Self-pay | Admitting: Gastroenterology

## 2018-01-26 LAB — COMPREHENSIVE METABOLIC PANEL
ALT: 13 U/L (ref 0–53)
AST: 24 U/L (ref 0–37)
Albumin: 4.1 g/dL (ref 3.5–5.2)
Alkaline Phosphatase: 60 U/L (ref 39–117)
BUN: 13 mg/dL (ref 6–23)
CO2: 30 mEq/L (ref 19–32)
Calcium: 9.3 mg/dL (ref 8.4–10.5)
Chloride: 101 mEq/L (ref 96–112)
Creatinine, Ser: 0.97 mg/dL (ref 0.40–1.50)
GFR: 80.32 mL/min (ref >60.00)
Glucose, Bld: 139 mg/dL — ABNORMAL HIGH (ref 70–99)
Potassium: 4.4 mEq/L (ref 3.5–5.1)
Sodium: 139 mEq/L (ref 135–145)
Total Bilirubin: 0.6 mg/dL (ref 0.2–1.2)
Total Protein: 6.4 g/dL (ref 6.0–8.3)

## 2018-01-26 LAB — LIPID PANEL
Cholesterol: 152 mg/dL (ref 0–200)
HDL: 68 mg/dL (ref >39.00)
LDL Cholesterol: 55 mg/dL (ref 0–99)
NonHDL: 83.71
Total CHOL/HDL Ratio: 2
Triglycerides: 142 mg/dL (ref 0.0–149.0)
VLDL: 28.4 mg/dL (ref 0.0–40.0)

## 2018-01-26 LAB — TESTOSTERONE: Testosterone: 672.14 ng/dL (ref 300.00–890.00)

## 2018-01-26 LAB — PSA: PSA: 0.46 ng/mL (ref 0.10–4.00)

## 2018-01-26 LAB — CBC
HCT: 42.6 % (ref 39.0–52.0)
Hemoglobin: 14.5 g/dL (ref 13.0–17.0)
MCHC: 34 g/dL (ref 30.0–36.0)
MCV: 97.5 fl (ref 78.0–100.0)
Platelets: 327 10*3/uL (ref 150.0–400.0)
RBC: 4.36 Mil/uL (ref 4.22–5.81)
RDW: 13.8 % (ref 11.5–15.5)
WBC: 5.7 10*3/uL (ref 4.0–10.5)

## 2018-01-26 LAB — TSH: TSH: 2.18 u[IU]/mL (ref 0.35–4.50)

## 2018-01-26 LAB — HEMOGLOBIN A1C: Hgb A1c MFr Bld: 6.9 % — ABNORMAL HIGH (ref 4.6–6.5)

## 2018-01-26 NOTE — Telephone Encounter (Signed)
Colon/path report printed from [place]. Patient is requesting to see Dr. Lyndel Safe. Records placed on his desk for review.

## 2018-01-31 LAB — PAIN MGMT, PROFILE 8 W/CONF, U
6 Acetylmorphine: NEGATIVE ng/mL (ref <10)
Alcohol Metabolites: POSITIVE ng/mL — AB (ref <500)
Alphahydroxyalprazolam: NEGATIVE ng/mL (ref <25)
Alphahydroxymidazolam: NEGATIVE ng/mL (ref <50)
Alphahydroxytriazolam: NEGATIVE ng/mL (ref <50)
Aminoclonazepam: NEGATIVE ng/mL (ref <25)
Amphetamines: NEGATIVE ng/mL (ref <500)
Benzodiazepines: POSITIVE ng/mL — AB (ref <100)
Buprenorphine, Urine: NEGATIVE ng/mL (ref <5)
Cocaine Metabolite: NEGATIVE ng/mL (ref <150)
Creatinine: 145.7 mg/dL
Ethyl Glucuronide (ETG): 24721 ng/mL — ABNORMAL HIGH (ref <500)
Ethyl Sulfate (ETS): 7863 ng/mL — ABNORMAL HIGH (ref <100)
Hydroxyethylflurazepam: NEGATIVE ng/mL (ref <50)
Lorazepam: NEGATIVE ng/mL (ref <50)
MDMA: NEGATIVE ng/mL (ref <500)
Marijuana Metabolite: NEGATIVE ng/mL (ref <20)
Nordiazepam: 232 ng/mL — ABNORMAL HIGH (ref <50)
Opiates: NEGATIVE ng/mL (ref <100)
Oxazepam: 576 ng/mL — ABNORMAL HIGH (ref <50)
Oxidant: NEGATIVE ug/mL (ref <200)
Oxycodone: NEGATIVE ng/mL (ref <100)
Temazepam: 244 ng/mL — ABNORMAL HIGH (ref <50)
pH: 6.91 (ref 4.5–9.0)

## 2018-02-06 ENCOUNTER — Encounter: Payer: Self-pay | Admitting: Family Medicine

## 2018-02-08 ENCOUNTER — Ambulatory Visit (INDEPENDENT_AMBULATORY_CARE_PROVIDER_SITE_OTHER): Payer: Medicare Other

## 2018-02-08 DIAGNOSIS — E349 Endocrine disorder, unspecified: Secondary | ICD-10-CM

## 2018-02-08 MED ORDER — TESTOSTERONE CYPIONATE 100 MG/ML IM SOLN
200.0000 mg | INTRAMUSCULAR | Status: AC
  Administered 2018-02-08: 200 mg via INTRAMUSCULAR

## 2018-02-14 ENCOUNTER — Other Ambulatory Visit: Payer: Self-pay | Admitting: Family Medicine

## 2018-02-14 NOTE — Telephone Encounter (Signed)
Lisinopril has refill left, no need to reorder.

## 2018-02-22 ENCOUNTER — Other Ambulatory Visit (INDEPENDENT_AMBULATORY_CARE_PROVIDER_SITE_OTHER): Payer: Medicare Other

## 2018-02-22 ENCOUNTER — Ambulatory Visit (INDEPENDENT_AMBULATORY_CARE_PROVIDER_SITE_OTHER): Payer: Medicare Other

## 2018-02-22 DIAGNOSIS — E349 Endocrine disorder, unspecified: Secondary | ICD-10-CM | POA: Diagnosis not present

## 2018-02-22 LAB — TESTOSTERONE: Testosterone: 353.15 ng/dL (ref 300.00–890.00)

## 2018-02-22 MED ORDER — TESTOSTERONE CYPIONATE 100 MG/ML IM SOLN
200.0000 mg | Freq: Once | INTRAMUSCULAR | Status: AC
  Administered 2018-02-22: 200 mg via INTRAMUSCULAR

## 2018-02-23 ENCOUNTER — Ambulatory Visit (INDEPENDENT_AMBULATORY_CARE_PROVIDER_SITE_OTHER): Payer: Medicare Other | Admitting: Specialist

## 2018-02-27 ENCOUNTER — Other Ambulatory Visit: Payer: Self-pay | Admitting: Family Medicine

## 2018-02-28 ENCOUNTER — Ambulatory Visit (INDEPENDENT_AMBULATORY_CARE_PROVIDER_SITE_OTHER): Payer: Medicare Other | Admitting: Specialist

## 2018-02-28 ENCOUNTER — Encounter (INDEPENDENT_AMBULATORY_CARE_PROVIDER_SITE_OTHER): Payer: Self-pay | Admitting: Specialist

## 2018-02-28 ENCOUNTER — Other Ambulatory Visit (INDEPENDENT_AMBULATORY_CARE_PROVIDER_SITE_OTHER): Payer: Self-pay | Admitting: Specialist

## 2018-02-28 ENCOUNTER — Ambulatory Visit (INDEPENDENT_AMBULATORY_CARE_PROVIDER_SITE_OTHER): Payer: Self-pay

## 2018-02-28 VITALS — Ht 70.0 in | Wt 201.0 lb

## 2018-02-28 DIAGNOSIS — M778 Other enthesopathies, not elsewhere classified: Secondary | ICD-10-CM

## 2018-02-28 DIAGNOSIS — M7582 Other shoulder lesions, left shoulder: Secondary | ICD-10-CM

## 2018-02-28 DIAGNOSIS — M4325 Fusion of spine, thoracolumbar region: Secondary | ICD-10-CM | POA: Diagnosis not present

## 2018-02-28 MED ORDER — METHYLPREDNISOLONE ACETATE 40 MG/ML IJ SUSP
40.0000 mg | INTRAMUSCULAR | Status: AC | PRN
  Administered 2018-02-28: 40 mg via INTRA_ARTICULAR

## 2018-02-28 MED ORDER — BUPIVACAINE HCL 0.25 % IJ SOLN
4.0000 mL | INTRAMUSCULAR | Status: AC | PRN
  Administered 2018-02-28: 4 mL via INTRA_ARTICULAR

## 2018-02-28 NOTE — Telephone Encounter (Signed)
I do not see this one here in [facility].

## 2018-02-28 NOTE — Telephone Encounter (Signed)
Referral back in workqueue. Have these records been recevied or reviewed? Please advise on scheduling. Thank you.

## 2018-02-28 NOTE — Telephone Encounter (Signed)
Have reviewed several Can you please on it by [place]

## 2018-02-28 NOTE — Progress Notes (Signed)
Office Visit Note   Patient: Luis Brown           Date of Birth: 1946/06/05           MRN: [id number] Visit Date: 02/28/2018              Requested by: [name], MD [address] [place], [place] [address] PCP: [name], MD   Assessment & Plan: Visit Diagnoses:  1. Fusion of spine of thoracolumbar region   2. Left shoulder tendonitis     Plan:Avoid frequent bending and stooping  No lifting greater than 10 lbs. May use ice or moist heat for pain. Weight loss is of benefit. 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  Avoid overhead lifting and overhead use of the arms. Do not lift greater than 10 lbs. Tylenol ES one every 6-8 hours for pain and inflamation.  A home exercise program.  Follow-Up Instructions: Return in about 3 months (around 05/31/2018).   Orders:  Orders Placed This Encounter  Procedures  . Large Joint Inj: L subacromial bursa  . XR Lumbar Spine 2-3 Views   No orders of the defined types were placed in this encounter.     Procedures: Large Joint Inj: L subacromial bursa on 02/28/2018 11:03 AM Indications: pain Details: 25 G 1.5 in needle, anterolateral approach  Arthrogram: No  Medications: 4 mL bupivacaine 0.25 %; 40 mg methylPREDNISolone acetate 40 MG/ML Outcome: tolerated well, no immediate complications  bandaid applied Consent was given by the patient. Immediately prior to procedure a time out was called to verify the correct patient, procedure, equipment, support staff and site/side marked as required. Patient was prepped and draped in the usual sterile fashion.       Clinical Data: No additional findings.   Subjective: Chief Complaint  Patient presents with  . Lower Back - Follow-up    72 year old male 9 months post Rearthrodesis L5-S1 with reinsertion of L5-S1 hardware.   Review of Systems  Constitutional: Negative.   HENT: Negative.   Eyes: Negative.   Respiratory: Negative.   Cardiovascular:  Negative.   Gastrointestinal: Negative.   Endocrine: Negative.   Genitourinary: Negative.   Musculoskeletal: Negative.   Skin: Negative.   Allergic/Immunologic: Negative.   Neurological: Negative.   Hematological: Negative.   Psychiatric/Behavioral: Negative.      Objective: Vital Signs: Ht 5\' 10"  (1.778 m)   Wt 201 lb (91.2 kg)   BMI 28.84 kg/m   Physical Exam  Constitutional: He is oriented to person, place, and time. He appears well-developed and well-nourished.  HENT:  Head: Normocephalic and atraumatic.  Eyes: Pupils are equal, round, and reactive to light. EOM are normal.  Neck: Normal range of motion. Neck supple.  Pulmonary/Chest: Effort normal and breath sounds normal.  Abdominal: Soft. Bowel sounds are normal.  Neurological: He is alert and oriented to person, place, and time.  Skin: Skin is warm and dry.  Psychiatric: He has a normal mood and affect. His behavior is normal. Judgment and thought content normal.    Back Exam   Tenderness  The patient is experiencing tenderness in the lumbar.  Range of Motion  Extension: 20  Flexion:  60 abnormal  Lateral bend right: normal  Lateral bend left: normal  Rotation right: normal  Rotation left: normal   Muscle Strength  Right Quadriceps:  5/5  Left Quadriceps:  5/5  Right Hamstrings:  5/5  Left Hamstrings:  5/5   Tests  Straight  leg raise right: negative Straight leg raise left: negative  Reflexes  Patellar: normal Achilles: normal Biceps: normal Babinski's sign: normal   Other  Toe walk: abnormal Heel walk: normal Sensation: normal Erythema: no back redness Scars: present   Right Shoulder Exam   Tenderness  The patient is experiencing no tenderness.  Range of Motion  Active abduction: normal  Passive abduction: normal  Extension: normal  External rotation: normal  Forward flexion: normal  Internal rotation 0 degrees: T8   Muscle Strength  Abduction: 5/5  Internal rotation: 5/5    External rotation: 5/5  Supraspinatus: 5/5  Subscapularis: 5/5  Biceps: 5/5   Tests  Apprehension: negative Hawkins test: negative Cross arm: negative Impingement: negative Drop arm: negative Sulcus: absent  Other  Erythema: absent Scars: absent Sensation: normal Pulse: present   Left Shoulder Exam   Tenderness  The patient is experiencing tenderness in the acromion.  Range of Motion  Active abduction: normal  Passive abduction: normal  Extension: normal  External rotation: normal  Forward flexion: normal  Internal rotation 0 degrees: T7   Muscle Strength  Abduction: 5/5  Internal rotation: 5/5  External rotation: 5/5  Supraspinatus: 5/5  Subscapularis: 5/5  Biceps: 5/5   Tests  Apprehension: negative Hawkins test: negative Cross arm: negative Impingement: positive Drop arm: negative Sulcus: absent  Other  Erythema: absent Scars: absent Sensation: normal Pulse: present   Comments:  Bandaid applied      Specialty Comments:  No specialty comments available.  Imaging: Xr Lumbar Spine 2-3 Views  Result Date: 02/28/2018 Ap and lateral lumbodorsal spine radiographs show gradual incorporation of the L5-S1 ALF the pedicle screws at L5-S1 show no lucency, though the left S1 screw is broken but overall the appearance is that of increasing bone density at the ALF site L5-S1. There is wedging of the upper fusion segment at T10 and the next level above the fusion site consistent with PJK at the     PMFS History: Patient Active Problem List   Diagnosis Date Noted  . 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 Thoracic stenosis 04/17/2017  . Painful orthopaedic hardware ([place]) 04/17/2017  . 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 ([place]) 04/17/2017  . Low back pain radiating to both legs 01/16/2017  . Esophageal reflux 02/10/2015  . Medicare annual wellness visit,  subsequent 02/10/2015  . Personal history of colonic polyps 10/27/2012  . 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  . BPH (benign prostatic hyperplasia) 04/23/2012  . ED (erectile dysfunction) 04/23/2012  . Allergic state 10/25/2011  . Anxiety and depression 10/25/2011  . Asthma   . Hyperglycemia   . Hyperlipidemia   . Overweight   . Insomnia   . Fatigue   . Preventative health care   . DDD (degenerative disc disease), lumbosacral   . HTN (hypertension)    Past Medical History:  Diagnosis Date  . Acute pharyngitis 10/21/2013  . Allergy    grass and pollen  . Anxiety and depression 10/25/2011  . BPH (benign prostatic hyperplasia) 04/23/2012  . Chicken pox as a child  . DDD (degenerative disc disease)    cervical responds to steroid injections and low back required surgery  . DDD (degenerative disc disease), lumbosacral   . Diabetes mellitus    pre  . ED (erectile dysfunction) 04/23/2012  . Elevated BP   . Esophageal reflux 02/10/2015  . Fatigue   . HTN (hypertension)   . Hyperglycemia  preDM   . Hyperlipidemia   . Insomnia   . Low back pain radiating to both legs 01/16/2017  . Measles as a child  . Medicare annual wellness visit, subsequent 02/10/2015  . Overweight(278.02)   . Personal history of colonic polyps 10/27/2012   Follows with [facility]  . Preventative health care   . 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    Family History  Problem Relation Age of Onset  . Hypertension Mother   . Diabetes Mother        type 2  . Cancer Mother 31       breast in remission  . Emphysema Father        smoker  . COPD Father        smoker  . Stroke Father 39       mini  . Heart disease Father   . Hypertension Sister   . Hyperlipidemia Sister   . Scoliosis Sister   . Osteoporosis Sister   . Hypertension Maternal Grandmother   . Scoliosis Maternal Grandmother   . Heart disease Maternal Grandfather   . Heart disease Paternal Grandfather        smoker  . Heart disease  Daughter     Past Surgical History:  Procedure Laterality Date  . BACK SURGERY  2012 and 1994   Dr Margret Chance, screws and cage in low back  . TONSILLECTOMY    . torn rotator cuff  2010   right   Social History   Occupational History  . Not on file  Tobacco Use  . Smoking status: Former Smoker    Packs/day: 1.00    Years: 20.00    Pack years: 20.00    Types: Cigarettes    Last attempt to quit: 07/18/1989    Years since quitting: 28.6  . Smokeless tobacco: Never Used  . Tobacco comment: off and on smoking for the 20 yrs  Substance and Sexual Activity  . Alcohol use: Yes    Comment: drink or two a day- mixed drink  . Drug use: No  . Sexual activity: Yes    Comment: lives with wife, still working, no dietary restrictions, continues to exercise intermittently

## 2018-02-28 NOTE — Telephone Encounter (Signed)
Celebrex refill request

## 2018-02-28 NOTE — Telephone Encounter (Signed)
Meaghan, Will you reach out to his referral source and get another copy?  It appears these records are not in [facility].  Thank you.

## 2018-02-28 NOTE — Patient Instructions (Signed)
Avoid frequent bending and stooping  No lifting greater than 10 lbs. May use ice or moist heat for pain. Weight loss is of benefit. 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  Avoid overhead lifting and overhead use of the arms. Do not lift greater than 10 lbs. Tylenol ES one every 6-8 hours for pain and inflamation.  A home exercise program.

## 2018-03-01 NOTE — Telephone Encounter (Signed)
Records were in Epic. Reprinted colon and path and sent to Dr.Gupta's office for review.

## 2018-03-02 ENCOUNTER — Telehealth: Payer: Self-pay | Admitting: Family Medicine

## 2018-03-02 ENCOUNTER — Other Ambulatory Visit: Payer: Self-pay | Admitting: Family Medicine

## 2018-03-02 ENCOUNTER — Telehealth: Payer: Self-pay

## 2018-03-02 DIAGNOSIS — E349 Endocrine disorder, unspecified: Secondary | ICD-10-CM

## 2018-03-02 MED ORDER — TESTOSTERONE CYPIONATE 200 MG/ML IM SOLN
200.0000 mg | INTRAMUSCULAR | Status: DC
Start: 2018-03-13 — End: 2018-03-06

## 2018-03-02 NOTE — Telephone Encounter (Signed)
Copied from [place] [phone] [id number]. Topic: Quick Communication - Rx Refill/Question >> Mar 02, 2018  2:58 PM Margot Ables wrote: Medication: testosterone cypionate (DEPOTESTOSTERONE CYPIONATE) injection 200 mg  - next week is last dose - pt states he usually gets 6 per order but last time only got 4. Please advise. Has the patient contacted their pharmacy? yes Preferred Pharmacy (with phone number or street name): [place], [place] [phone] (Phone) [phone] (Fax)

## 2018-03-02 NOTE — Telephone Encounter (Signed)
Can this Rx be reviewed for patient- Rx not on medication list- his is currently getting injections.

## 2018-03-02 NOTE — Telephone Encounter (Signed)
I refilled the testosterone in the chart.

## 2018-03-02 NOTE — Telephone Encounter (Signed)
I put on Dr. Leland Her desk for review.

## 2018-03-02 NOTE — Telephone Encounter (Signed)
Refill request for testosterone cypionate injection. Last OV: 01/25/18 Last RF: 09/26/17.   01/25/18:CSC (Opioid and Non-Opioid) completed and signed, UDS sample given.

## 2018-03-05 ENCOUNTER — Other Ambulatory Visit: Payer: Self-pay | Admitting: Family Medicine

## 2018-03-05 NOTE — Telephone Encounter (Signed)
Rx refilled.

## 2018-03-06 MED ORDER — TESTOSTERONE CYPIONATE 200 MG/ML IM SOLN: 200.0000 mg | INTRAMUSCULAR | 3 refills | Status: DC

## 2018-03-06 NOTE — Addendum Note (Signed)
Addended by: Emi Holes on: 03/06/2018 03:56 PM   Modules accepted: Orders

## 2018-03-07 ENCOUNTER — Ambulatory Visit (INDEPENDENT_AMBULATORY_CARE_PROVIDER_SITE_OTHER): Payer: Medicare Other

## 2018-03-07 DIAGNOSIS — E349 Endocrine disorder, unspecified: Secondary | ICD-10-CM | POA: Diagnosis not present

## 2018-03-07 MED ORDER — TESTOSTERONE CYPIONATE 100 MG/ML IM SOLN
200.0000 mg | Freq: Once | INTRAMUSCULAR | Status: AC
  Administered 2018-03-07: 200 mg via INTRAMUSCULAR

## 2018-03-09 ENCOUNTER — Other Ambulatory Visit (INDEPENDENT_AMBULATORY_CARE_PROVIDER_SITE_OTHER): Payer: Self-pay | Admitting: Specialist

## 2018-03-09 NOTE — Telephone Encounter (Signed)
Please advise 

## 2018-03-21 ENCOUNTER — Ambulatory Visit: Payer: Medicare Other

## 2018-03-21 ENCOUNTER — Ambulatory Visit (HOSPITAL_BASED_OUTPATIENT_CLINIC_OR_DEPARTMENT_OTHER)
Admission: RE | Admit: 2018-03-21 | Discharge: 2018-03-21 | Disposition: A | Discharge status: Home / Self Care | Payer: Medicare Other | Source: Ambulatory Visit | Attending: Medical | Admitting: Medical

## 2018-03-21 ENCOUNTER — Encounter: Payer: Self-pay | Admitting: Medical

## 2018-03-21 ENCOUNTER — Ambulatory Visit: Payer: Medicare Other | Admitting: Medical

## 2018-03-21 VITALS — BP 146/58 | HR 102 | Temp 98.4°F | Resp 16 | Ht 70.0 in | Wt 199.2 lb

## 2018-03-21 DIAGNOSIS — M25552 Pain in left hip: Secondary | ICD-10-CM

## 2018-03-21 DIAGNOSIS — R7989 Other specified abnormal findings of blood chemistry: Secondary | ICD-10-CM

## 2018-03-21 DIAGNOSIS — M160 Bilateral primary osteoarthritis of hip: Secondary | ICD-10-CM | POA: Insufficient documentation

## 2018-03-21 DIAGNOSIS — R1032 Left lower quadrant pain: Secondary | ICD-10-CM | POA: Diagnosis not present

## 2018-03-21 MED ORDER — CYCLOBENZAPRINE HCL 5 MG PO TABS: 5.0000 mg | ORAL_TABLET | Freq: Every day | ORAL | 0 refills | Status: DC

## 2018-03-21 MED ORDER — TESTOSTERONE CYPIONATE 200 MG/ML IM SOLN
200.0000 mg | Freq: Once | INTRAMUSCULAR | Status: AC
  Administered 2018-03-21: 200 mg via INTRAMUSCULAR

## 2018-03-21 NOTE — Progress Notes (Signed)
Subjective:    [name], male    DOB: 1946/01/10, 72 y.o.   MRN: [id number]  HPI  Pt in with some groin region pain.   He has history of lower back surgery. Last surgery was past November. 3 other prior surgery in his back.  So he always is cautious.  Pt states left groin started to hurt for about one week and half ago. Pain will occur randomly with walking when he lifts leg. Pt not sure if feels a bulge in his groin.   Pt takes some celebrex and tramadol for pain. He is on celebrex 200 mg a day. Takes only one tramadol a day.  Pt states pain will occur randomly with certain positions.  He expresses when has pain can be intense and sharp. He would like to get better before going on upcoming trip to beach end of September.     Review of Systems  Constitutional: Negative for chills, fatigue and fever.  Respiratory: Negative for cough, chest tightness, shortness of breath and wheezing.   Cardiovascular: Negative for chest pain and palpitations.  Gastrointestinal: Negative for abdominal pain.  Genitourinary: Negative for penile swelling, testicular pain and urgency.  Musculoskeletal:       Left groin area pain.  Skin: Negative for rash.  Hematological: Negative for adenopathy. Does not bruise/bleed easily.  Psychiatric/Behavioral: Negative for behavioral problems, confusion and sleep disturbance. The patient is not nervous/anxious.     Past Medical History:  Diagnosis Date  . Acute pharyngitis 10/21/2013  . Allergy    grass and pollen  . Anxiety and depression 10/25/2011  . BPH (benign prostatic hyperplasia) 04/23/2012  . Chicken pox as a child  . DDD (degenerative disc disease)    cervical responds to steroid injections and low back required surgery  . DDD (degenerative disc disease), lumbosacral   . Diabetes mellitus    pre  . ED (erectile dysfunction) 04/23/2012  . Elevated BP   . Esophageal reflux 02/10/2015  . Fatigue   . HTN (hypertension)   .  Hyperglycemia    preDM   . Hyperlipidemia   . Insomnia   . Low back pain radiating to both legs 01/16/2017  . Measles as a child  . Medicare annual wellness visit, subsequent 02/10/2015  . Overweight(278.02)   . Personal history of colonic polyps 10/27/2012   Follows with [facility]  . Preventative health care   . 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     Social History   Socioeconomic History  . Marital status: Married    Spouse name: Not on file  . Number of children: Not on file  . Years of education: Not on file  . Highest education level: Not on file  Occupational History  . Not on file  Social Needs  . Financial resource strain: Not on file  . Food insecurity:    Worry: Not on file    Inability: Not on file  . Transportation needs:    Medical: Not on file    Non-medical: Not on file  Tobacco Use  . Smoking status: Former Smoker    Packs/day: 1.00    Years: 20.00    Pack years: 20.00    Types: Cigarettes    Last attempt to quit: 07/18/1989    Years since quitting: 28.6  . Smokeless tobacco: Never Used  . Tobacco comment: off and on smoking for the 20 yrs  Substance and Sexual Activity  . Alcohol use: Yes  Comment: drink or two a day- mixed drink  . Drug use: No  . Sexual activity: Yes    Comment: lives with wife, still working, no dietary restrictions, continues to exercise intermittently  Lifestyle  . Physical activity:    Days per week: Not on file    Minutes per session: Not on file  . Stress: Not on file  Relationships  . Social connections:    Talks on phone: Not on file    Gets together: Not on file    Attends religious service: Not on file    Active member of club or organization: Not on file    Attends meetings of clubs or organizations: Not on file    Relationship status: Not on file  . Intimate partner violence:    Fear of current or ex partner: Not on file    Emotionally abused: Not on file    Physically abused: Not on file     Forced sexual activity: Not on file  Other Topics Concern  . Not on file  Social History Narrative  . Not on file    Past Surgical History:  Procedure Laterality Date  . BACK SURGERY  2012 and 1994   Dr Margret Chance, screws and cage in low back  . TONSILLECTOMY    . torn rotator cuff  2010   right    Family History  Problem Relation Age of Onset  . Hypertension Mother   . Diabetes Mother        type 2  . Cancer Mother 46       breast in remission  . Emphysema Father        smoker  . COPD Father        smoker  . Stroke Father 65       mini  . Heart disease Father   . Hypertension Sister   . Hyperlipidemia Sister   . Scoliosis Sister   . Osteoporosis Sister   . Hypertension Maternal Grandmother   . Scoliosis Maternal Grandmother   . Heart disease Maternal Grandfather   . Heart disease Paternal Grandfather        smoker  . Heart disease Daughter     No Known Allergies  Current Outpatient Medications on File Prior to Visit  Medication Sig Dispense Refill  . acyclovir (ZOVIRAX) 200 MG capsule TAKE ONE CAPSULE BY MOUTH 5 TIMES A DAY 25 capsule 1  . aspirin 81 MG tablet Take 81 mg by mouth daily.    Marland Kitchen atorvastatin (LIPITOR) 20 MG tablet TAKE 1/2 TABLET BY MOUTH DAILY AT 6 PM (Patient taking differently: Take 10 mg by mouth daily at 6 PM. TAKE 1/2 TABLET BY MOUTH DAILY AT 6 PM) 90 tablet 1  . b complex vitamins tablet Take 1 tablet by mouth daily.    . celecoxib (CELEBREX) 200 MG capsule take 1 capsule by mouth daily 90 capsule 0  . diazepam (VALIUM) 10 MG tablet TAKE ONE TABLET BY MOUTH EVERY DAY AT BEDTIME AS NEEDED 30 tablet 0  . glucose blood (ONE TOUCH ULTRA TEST) test strip Check daily 32 each 1  . HYDROcodone-acetaminophen (NORCO) 7.5-325 MG tablet Take 1-2 tablets by mouth every 6 (six) hours as needed for moderate pain. 50 tablet 0  . lisinopril (PRINIVIL,ZESTRIL) 5 MG tablet TAKE 1 TABLET BY MOUTH EVERY DAY 90 tablet 1  . metFORMIN (GLUCOPHAGE) 500 MG tablet Take 1  tablet (500 mg total) by mouth daily. 90 tablet 1  . PROAIR HFA  108 (90 Base) MCG/ACT inhaler TAKE 1-2 PUFFS INHALE THREE TIMES A DAY (Patient taking differently: TAKE 1-2 PUFFS INHALE THREE TIMES A DAY AS NEEDED FOR SHORTNESS OF BREATH) 8.5 Inhaler 0  . ranitidine (ZANTAC) 300 MG tablet Take 1 tablet (300 mg total) by mouth daily. 90 tablet 0  . sildenafil (REVATIO) 20 MG tablet TAKE ONE TO THREE TABLETS BY MOUTH DAILY AS NEEDED 50 tablet 2  . tamsulosin (FLOMAX) 0.4 MG CAPS capsule Take 2 capsules (0.8 mg total) by mouth daily. 180 capsule 1  . testosterone cypionate (DEPOTESTOSTERONE CYPIONATE) 200 MG/ML injection Inject 1 mL (200 mg total) into the muscle every 14 (fourteen) days. 10 mL 3  . traMADol (ULTRAM) 50 MG tablet TAKE 1/2 TO 1 TABLET 4 TIMES A DAY AS NEEDED 120 tablet 0  . venlafaxine XR (EFFEXOR-XR) 150 MG 24 hr capsule Take 1 capsule (150 mg total) by mouth daily with breakfast. 90 capsule 1  . cetirizine (ZYRTEC) 10 MG tablet Take 1 tablet (10 mg total) by mouth daily as needed for allergies or rhinitis. 30 tablet 11  . fluticasone (FLONASE) 50 MCG/ACT nasal spray Place 2 sprays into the nose daily. 48 g 1   No current facility-administered medications on file prior to visit.     BP (!) 146/58   Pulse (!) 102   Temp 98.4 F (36.9 C) (Oral)   Resp 16   Ht 5\' 10"  (1.778 m)   Wt 199 lb 3.2 oz (90.4 kg)   SpO2 99%   BMI 28.58 kg/m       Objective:   Physical Exam  General- No acute distress. Pleasant patient. Neck- Full range of motion, no jvd Lungs- Clear, even and unlabored. Heart- regular rate and rhythm. Neurologic- CNII- XII grossly intact.   Genital exam- no testicle tenderness to palpation. On exam inguinal canals are clear. No hernias palpated.   Left hip- no pain on palpation direct over the hip. But groin area pain on abduction and rotation.      Assessment & Plan:  For your left groin region pain, I do want you to continue your Celebrex, tramadol and  after discussion decided to give low-dose Flexeril to use 1 tablet at night.  Since the pain is lingering now for 10 days and you have upcoming fishing trip, I do think it would be beneficial to get to the sports medicine opinion.  Since sometimes a groin region pain can be associated with hip pain, I did decide to go ahead and order a left hip x-ray today.  Referral to sports medicine placed.  Hopefully they will call you later this week and get you in early next week.  If no call by Monday then please call me or my chart me and I can follow-up on that referral.  For history of low testosterone, we gave you testosterone injection today as you report last injection was 2 weeks ago.  Follow-up with our office as needed fo your groin region pain.  We can see you as needed in the event referral to sports medicine is delayed.  Mackie Pai, PA-C

## 2018-03-21 NOTE — Patient Instructions (Signed)
For your left groin region pain, I do want you to continue your Celebrex, tramadol and after discussion decided to give low-dose Flexeril to use 1 tablet at night.  Since the pain is lingering now for 10 days and you have upcoming fishing trip, I do think it would be beneficial to get to the sports medicine opinion.  Since sometimes a groin region pain can be associated with hip pain, I did decide to go ahead and order a left hip x-ray today.  Referral to sports medicine placed.  Hopefully they will call you later this week and get you in early next week.  If no call by Monday then please call me or my chart me and I can follow-up on that referral.  For history of low testosterone, we gave you testosterone injection today as you report last injection was 2 weeks ago.  Follow-up with our office as needed fo your groin region pain.  We can see you as needed in the event referral to sports medicine is delayed.

## 2018-03-22 ENCOUNTER — Other Ambulatory Visit: Payer: Self-pay

## 2018-03-22 ENCOUNTER — Telehealth: Payer: Self-pay | Admitting: Family Medicine

## 2018-03-22 DIAGNOSIS — K219 Gastro-esophageal reflux disease without esophagitis: Secondary | ICD-10-CM

## 2018-03-22 MED ORDER — RANITIDINE HCL 300 MG PO TABS: 300.0000 mg | ORAL_TABLET | Freq: Every day | ORAL | 0 refills | Status: DC

## 2018-03-22 NOTE — Telephone Encounter (Signed)
Copied from [place] [id number]. Topic: Quick Communication - Rx Refill/Question >> Mar 22, 2018  9:47 AM Synthia Innocent wrote: Medication: ranitidine (ZANTAC) 300 MG tablet   Has the patient contacted their pharmacy? Yes.   (Agent: If no, request that the patient contact the pharmacy for the refill.) (Agent: If yes, when and what did the pharmacy advise?)  [facility]erred [facility] (with phone number [facility] street name): [facility] [address]  Agent: Please be advised that RX refills may take up to 3 business days. We ask that you follow-up with your pharmacy.

## 2018-03-24 ENCOUNTER — Other Ambulatory Visit (INDEPENDENT_AMBULATORY_CARE_PROVIDER_SITE_OTHER): Payer: Self-pay | Admitting: Specialist

## 2018-03-26 NOTE — Telephone Encounter (Signed)
Tramadol refill request 

## 2018-03-27 ENCOUNTER — Encounter: Payer: Self-pay | Admitting: Family Medicine

## 2018-03-27 ENCOUNTER — Ambulatory Visit (INDEPENDENT_AMBULATORY_CARE_PROVIDER_SITE_OTHER): Payer: Medicare Other | Admitting: Family Medicine

## 2018-03-27 VITALS — BP 131/81 | HR 83 | Ht 70.0 in | Wt 190.0 lb

## 2018-03-27 DIAGNOSIS — M25552 Pain in left hip: Secondary | ICD-10-CM

## 2018-03-27 MED ORDER — METHOCARBAMOL 500 MG PO TABS: 500.0000 mg | ORAL_TABLET | Freq: Three times a day (TID) | ORAL | 1 refills | Status: DC | PRN

## 2018-03-27 NOTE — Patient Instructions (Signed)
You have an adductor (groin) strain. Start standing hip exercises I showed you without resistance 3 sets of 10 once a day. When easy, use yellow band, advance to red band. Finally when these are easy do the lying raise exercise we discussed against gravity. Icing 15 minutes at a time 3-4 times a day. Robaxin as needed. Continue your celebrex and tramadol. Compression shorts may be helpful. Follow up with me in 4 weeks. Call me if you're struggling and want to do physical therapy.

## 2018-03-27 NOTE — Telephone Encounter (Signed)
[facility] 

## 2018-03-31 ENCOUNTER — Encounter: Payer: Self-pay | Admitting: Family Medicine

## 2018-03-31 NOTE — Progress Notes (Signed)
PCP and consultation requested by: [name], MD  Subjective:   HPI: Patient is a 72 y.o. male here for left groin pain.  Patient reports for about 2.5 weeks he's had pain anterior medial aspect of left groin. Pain worse with a lot of walking. Pain up to 2/10 and a soreness Is being more careful with steps. Takes robaxin, celebrex, tramadol more for his back. Has numbness in left leg from his back issues - this has not changed. No skin changes.  Past Medical History:  Diagnosis Date  . Acute pharyngitis 10/21/2013  . Allergy    grass and pollen  . Anxiety and depression 10/25/2011  . BPH (benign prostatic hyperplasia) 04/23/2012  . Chicken pox as a child  . DDD (degenerative disc disease)    cervical responds to steroid injections and low back required surgery  . DDD (degenerative disc disease), lumbosacral   . Diabetes mellitus    pre  . ED (erectile dysfunction) 04/23/2012  . Elevated BP   . Esophageal reflux 02/10/2015  . Fatigue   . HTN (hypertension)   . Hyperglycemia    preDM   . Hyperlipidemia   . Insomnia   . Low back pain radiating to both legs 01/16/2017  . Measles as a child  . Medicare annual wellness visit, subsequent 02/10/2015  . Overweight(278.02)   . Personal history of colonic polyps 10/27/2012   Follows with [facility]  . Preventative health care   . 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    Current Outpatient Medications on File Prior to Visit  Medication Sig Dispense Refill  . acyclovir (ZOVIRAX) 200 MG capsule TAKE ONE CAPSULE BY MOUTH 5 TIMES A DAY 25 capsule 1  . aspirin 81 MG tablet Take 81 mg by mouth daily.    Marland Kitchen atorvastatin (LIPITOR) 20 MG tablet TAKE 1/2 TABLET BY MOUTH DAILY AT 6 PM (Patient taking differently: Take 10 mg by mouth daily at 6 PM. TAKE 1/2 TABLET BY MOUTH DAILY AT 6 PM) 90 tablet 1  . b complex vitamins tablet Take 1 tablet by mouth daily.    . celecoxib (CELEBREX) 200 MG capsule take 1 capsule by mouth daily 90  capsule 0  . cetirizine (ZYRTEC) 10 MG tablet Take 1 tablet (10 mg total) by mouth daily as needed for allergies or rhinitis. 30 tablet 11  . cyclobenzaprine (FLEXERIL) 5 MG tablet Take 1 tablet (5 mg total) by mouth at bedtime. 4 tablet 0  . diazepam (VALIUM) 10 MG tablet TAKE ONE TABLET BY MOUTH EVERY DAY AT BEDTIME AS NEEDED 30 tablet 0  . fluticasone (FLONASE) 50 MCG/ACT nasal spray Place 2 sprays into the nose daily. 48 g 1  . glucose blood (ONE TOUCH ULTRA TEST) test strip Check daily 32 each 1  . HYDROcodone-acetaminophen (NORCO) 7.5-325 MG tablet Take 1-2 tablets by mouth every 6 (six) hours as needed for moderate pain. 50 tablet 0  . lisinopril (PRINIVIL,ZESTRIL) 5 MG tablet TAKE 1 TABLET BY MOUTH EVERY DAY 90 tablet 1  . metFORMIN (GLUCOPHAGE) 500 MG tablet Take 1 tablet (500 mg total) by mouth daily. 90 tablet 1  . PROAIR HFA 108 (90 Base) MCG/ACT inhaler TAKE 1-2 PUFFS INHALE THREE TIMES A DAY (Patient taking differently: TAKE 1-2 PUFFS INHALE THREE TIMES A DAY AS NEEDED FOR SHORTNESS OF BREATH) 8.5 Inhaler 0  . ranitidine (ZANTAC) 300 MG tablet Take 1 tablet (300 mg total) by mouth daily. 90 tablet 0  . sildenafil (REVATIO) 20 MG tablet TAKE ONE  TO THREE TABLETS BY MOUTH DAILY AS NEEDED 50 tablet 2  . tamsulosin (FLOMAX) 0.4 MG CAPS capsule Take 2 capsules (0.8 mg total) by mouth daily. 180 capsule 1  . testosterone cypionate (DEPOTESTOSTERONE CYPIONATE) 200 MG/ML injection Inject 1 mL (200 mg total) into the muscle every 14 (fourteen) days. 10 mL 3  . traMADol (ULTRAM) 50 MG tablet TAKE 1/2 TO 1 TABLET BY MOUTH 4 TIMES A DAY AS NEEDED 120 tablet 0  . venlafaxine XR (EFFEXOR-XR) 150 MG 24 hr capsule Take 1 capsule (150 mg total) by mouth daily with breakfast. 90 capsule 1   No current facility-administered medications on file prior to visit.     Past Surgical History:  Procedure Laterality Date  . BACK SURGERY  2012 and 1994   Dr Margret Chance, screws and cage in low back  .  TONSILLECTOMY    . torn rotator cuff  2010   right    No Known Allergies  Social History   Socioeconomic History  . Marital status: Married    Spouse name: Not on file  . Number of children: Not on file  . Years of education: Not on file  . Highest education level: Not on file  Occupational History  . Not on file  Social Needs  . Financial resource strain: Not on file  . Food insecurity:    Worry: Not on file    Inability: Not on file  . Transportation needs:    Medical: Not on file    Non-medical: Not on file  Tobacco Use  . Smoking status: Former Smoker    Packs/day: 1.00    Years: 20.00    Pack years: 20.00    Types: Cigarettes    Last attempt to quit: 07/18/1989    Years since quitting: 28.7  . Smokeless tobacco: Never Used  . Tobacco comment: off and on smoking for the 20 yrs  Substance and Sexual Activity  . Alcohol use: Yes    Comment: drink or two a day- mixed drink  . Drug use: No  . Sexual activity: Yes    Comment: lives with wife, still working, no dietary restrictions, continues to exercise intermittently  Lifestyle  . Physical activity:    Days per week: Not on file    Minutes per session: Not on file  . Stress: Not on file  Relationships  . Social connections:    Talks on phone: Not on file    Gets together: Not on file    Attends religious service: Not on file    Active member of club or organization: Not on file    Attends meetings of clubs or organizations: Not on file    Relationship status: Not on file  . Intimate partner violence:    Fear of current or ex partner: Not on file    Emotionally abused: Not on file    Physically abused: Not on file    Forced sexual activity: Not on file  Other Topics Concern  . Not on file  Social History Narrative  . Not on file    Family History  Problem Relation Age of Onset  . Hypertension Mother   . Diabetes Mother        type 2  . Cancer Mother 73       breast in remission  . Emphysema Father         smoker  . COPD Father        smoker  . Stroke Father  78       mini  . Heart disease Father   . Hypertension Sister   . Hyperlipidemia Sister   . Scoliosis Sister   . Osteoporosis Sister   . Hypertension Maternal Grandmother   . Scoliosis Maternal Grandmother   . Heart disease Maternal Grandfather   . Heart disease Paternal Grandfather        smoker  . Heart disease Daughter     BP 131/81   Pulse 83   Ht 5\' 10"  (1.778 m)   Wt 190 lb (86.2 kg)   BMI 27.26 kg/m   Review of Systems: See HPI above.     Objective:  Physical Exam:  Gen: NAD, comfortable in exam room  Left hip: No deformity. Mild limitation IR and ER but no pain passively.  5/5 strength except 4/5 and painful with adduction.   TTP adductors proximally.  No pubic symphysis, anterior hip, greater trochanter, other tenderness. NVI distally. Negative logroll. Negative FADIR, FABER  Right hip: No deformity. FROM with 5/5 strength. No tenderness to palpation. NVI distally.   Assessment & Plan:  1. Left hip pain - Patient's exam consistent with adductor strain.  Independently reviewed hip radiographs and has mild arthritis but negative logroll, no pain with passive motion as would expect with hip arthritis.  Shown home exercises and how to advance these.  Icing.  Continue celebrex, tramadol with robaxin as needed.  Consider physical therapy if struggling.  F/u in 4 weeks.

## 2018-04-03 ENCOUNTER — Telehealth: Payer: Self-pay

## 2018-04-03 NOTE — Telephone Encounter (Signed)
Patient requesting testosterone injection    Patient may need nurse visit appointment for his testosterone injection   Can you arrange

## 2018-04-04 NOTE — Telephone Encounter (Signed)
Called pt left voicemail pt needs to call back and set up nurse visit for injection.

## 2018-04-10 ENCOUNTER — Other Ambulatory Visit: Payer: Self-pay | Admitting: Family Medicine

## 2018-04-10 NOTE — Telephone Encounter (Signed)
Pt is requesting refill on diazepam.   Last OV: 01/25/2018 Last Fill: 01/16/2018 #30 and 0RF UDS: 01/25/2018

## 2018-04-11 ENCOUNTER — Encounter: Payer: Self-pay | Admitting: Family Medicine

## 2018-04-13 ENCOUNTER — Other Ambulatory Visit: Payer: Self-pay | Admitting: Family Medicine

## 2018-04-13 MED ORDER — ACYCLOVIR 200 MG PO CAPS: ORAL_CAPSULE | ORAL | 1 refills | Status: DC

## 2018-04-13 NOTE — Telephone Encounter (Signed)
Rx sent 

## 2018-04-13 NOTE — Telephone Encounter (Signed)
Copied from [place] [id number]. Topic: Quick Communication - Rx Refill/Question >> Apr 13, 2018 11:06 AM Margot Ables wrote: Medication: acyclovir (ZOVIRAX) 200 MG capsule - request was faxed 04/10/18 - requesting refill - pt out  Has the patient contacted their pharmacy? Yes - pharmacy calling Preferred Pharm[facility] (with phone number or street name[id number] [place] [place] [phone] (Phone) [phone] (Fax)

## 2018-04-13 NOTE — Telephone Encounter (Signed)
Zovirax refill Last Refill:12/06/17 # 25 with 1 refill Last OV: 03/21/18 PCP: Dr. [name] [facility] [address]

## 2018-04-18 ENCOUNTER — Ambulatory Visit: Payer: Medicare Other

## 2018-04-20 ENCOUNTER — Other Ambulatory Visit: Payer: Self-pay | Admitting: Family Medicine

## 2018-04-20 ENCOUNTER — Ambulatory Visit (INDEPENDENT_AMBULATORY_CARE_PROVIDER_SITE_OTHER): Payer: Medicare Other

## 2018-04-20 DIAGNOSIS — R7989 Other specified abnormal findings of blood chemistry: Secondary | ICD-10-CM

## 2018-04-20 MED ORDER — TESTOSTERONE CYPIONATE 100 MG/ML IM SOLN
200.0000 mg | Freq: Once | INTRAMUSCULAR | Status: AC
  Administered 2018-04-20: 200 mg via INTRAMUSCULAR

## 2018-04-23 ENCOUNTER — Other Ambulatory Visit: Payer: Self-pay | Admitting: Family Medicine

## 2018-04-24 ENCOUNTER — Ambulatory Visit: Payer: Medicare Other | Admitting: Family Medicine

## 2018-04-24 NOTE — Telephone Encounter (Signed)
Dr.Gupta reviewed records and accepted for patient to be scheduled for a direct colonoscopy in [place]. Left message for patient to call back and schedule an appointment.

## 2018-05-02 DIAGNOSIS — J069 Acute upper respiratory infection, unspecified: Secondary | ICD-10-CM | POA: Diagnosis not present

## 2018-05-04 ENCOUNTER — Encounter: Payer: Self-pay | Admitting: Family Medicine

## 2018-05-04 ENCOUNTER — Ambulatory Visit: Payer: Medicare Other

## 2018-05-04 NOTE — Progress Notes (Signed)
Noted. Agree with above.  [place], DO 05/04/18 8:04 AM

## 2018-05-22 ENCOUNTER — Ambulatory Visit: Payer: Medicare Other

## 2018-05-22 ENCOUNTER — Encounter

## 2018-05-24 ENCOUNTER — Other Ambulatory Visit: Payer: Self-pay | Admitting: Family Medicine

## 2018-05-25 ENCOUNTER — Ambulatory Visit (INDEPENDENT_AMBULATORY_CARE_PROVIDER_SITE_OTHER): Payer: Medicare Other

## 2018-05-25 DIAGNOSIS — R7989 Other specified abnormal findings of blood chemistry: Secondary | ICD-10-CM

## 2018-05-25 MED ORDER — TESTOSTERONE CYPIONATE 200 MG/ML IM SOLN: 200.0000 mg | Freq: Once | INTRAMUSCULAR | 0 refills | Status: DC

## 2018-05-25 MED ORDER — TESTOSTERONE CYPIONATE 100 MG/ML IM SOLN
200.0000 mg | Freq: Once | INTRAMUSCULAR | Status: AC
  Administered 2018-05-25: 200 mg via INTRAMUSCULAR

## 2018-05-25 NOTE — Addendum Note (Signed)
Addended by: Vernie Shanks E on: 05/25/2018 11:19 AM   Modules accepted: Orders

## 2018-05-25 NOTE — Addendum Note (Signed)
Addended by: Vernie Shanks E on: 05/25/2018 11:20 AM   Modules accepted: Orders

## 2018-05-28 NOTE — Telephone Encounter (Signed)
Pt last seen 01/2018 and is due for follow up 07/2018. Refill sent as well as mychart message to schedule appt.

## 2018-06-01 ENCOUNTER — Other Ambulatory Visit (INDEPENDENT_AMBULATORY_CARE_PROVIDER_SITE_OTHER): Payer: Self-pay | Admitting: Specialist

## 2018-06-01 NOTE — Telephone Encounter (Signed)
Celecoxib refill request  

## 2018-06-07 ENCOUNTER — Ambulatory Visit (INDEPENDENT_AMBULATORY_CARE_PROVIDER_SITE_OTHER): Payer: Medicare Other

## 2018-06-07 DIAGNOSIS — R7989 Other specified abnormal findings of blood chemistry: Secondary | ICD-10-CM

## 2018-06-07 DIAGNOSIS — Z23 Encounter for immunization: Secondary | ICD-10-CM | POA: Diagnosis not present

## 2018-06-07 MED ORDER — TESTOSTERONE CYPIONATE 200 MG/ML IM SOLN
200.0000 mg | Freq: Once | INTRAMUSCULAR | Status: AC
  Administered 2018-06-07: 200 mg via INTRAMUSCULAR

## 2018-06-07 NOTE — Addendum Note (Signed)
Addended by: Vernie Shanks E on: 06/07/2018 11:08 AM   Modules accepted: Orders

## 2018-06-07 NOTE — Progress Notes (Signed)
Pre visit review using our clinic tool,if applicable. No additional management support is needed unless otherwise documented below in the visit note.   Patient in for Testosterone injection per order from  S. [name] M.D.   Given 200 mg IM right hip. No complaints voiced.  Return appointment scheduled for 2 weeks.

## 2018-06-16 ENCOUNTER — Other Ambulatory Visit: Payer: Self-pay | Admitting: Family Medicine

## 2018-06-16 DIAGNOSIS — K219 Gastro-esophageal reflux disease without esophagitis: Secondary | ICD-10-CM

## 2018-06-18 NOTE — Telephone Encounter (Signed)
Zantac recalled- please advise alternative.

## 2018-06-19 ENCOUNTER — Other Ambulatory Visit: Payer: Self-pay | Admitting: Family Medicine

## 2018-06-19 ENCOUNTER — Ambulatory Visit: Payer: Medicare Other

## 2018-06-19 MED ORDER — FAMOTIDINE 40 MG PO TABS: 40.0000 mg | ORAL_TABLET | Freq: Every day | ORAL | 1 refills | Status: DC

## 2018-06-19 NOTE — Telephone Encounter (Signed)
Sent in Famotidine 40 mg daily

## 2018-07-02 ENCOUNTER — Telehealth (INDEPENDENT_AMBULATORY_CARE_PROVIDER_SITE_OTHER): Payer: Self-pay | Admitting: Specialist

## 2018-07-02 NOTE — Telephone Encounter (Signed)
Rx request, please advise. °

## 2018-07-03 ENCOUNTER — Ambulatory Visit (INDEPENDENT_AMBULATORY_CARE_PROVIDER_SITE_OTHER): Payer: Medicare Other

## 2018-07-03 ENCOUNTER — Ambulatory Visit: Payer: Medicare Other

## 2018-07-03 DIAGNOSIS — R7989 Other specified abnormal findings of blood chemistry: Secondary | ICD-10-CM | POA: Diagnosis not present

## 2018-07-03 MED ORDER — TESTOSTERONE CYPIONATE 200 MG/ML IM SOLN
200.0000 mg | INTRAMUSCULAR | Status: DC
  Administered 2018-04-20 – 2018-07-03 (×2): 200 mg via INTRAMUSCULAR

## 2018-07-04 NOTE — Telephone Encounter (Signed)
Patient needs return office visit.  Was supposed to see Dr. Louanne Skye last month.

## 2018-07-06 NOTE — Progress Notes (Signed)
Nursing documentation reviewed. Agree with documention and plan.

## 2018-07-09 ENCOUNTER — Encounter: Payer: Self-pay | Admitting: Family Medicine

## 2018-07-09 ENCOUNTER — Other Ambulatory Visit: Payer: Self-pay | Admitting: Family Medicine

## 2018-07-09 MED ORDER — TRAMADOL HCL 50 MG PO TABS: 50.0000 mg | ORAL_TABLET | Freq: Two times a day (BID) | ORAL | 0 refills | Status: DC | PRN

## 2018-07-10 ENCOUNTER — Encounter: Payer: Self-pay | Admitting: Family Medicine

## 2018-07-12 ENCOUNTER — Other Ambulatory Visit: Payer: Self-pay | Admitting: Family Medicine

## 2018-07-12 NOTE — Telephone Encounter (Signed)
Copied from [place] [id number]. Topic: Quick Communication - Rx Refill/Question >> Jul 12, 2018 11:11 AM Percell Belt A wrote: Medication: testosterone cypionate (DEPOTESTOSTERONE CYPIONATE) 200 MG/ML injection [[id number]]  Has the patient contacted their pharmacy? No  (Agent: If no, request that the patient contact the pharmacy for the refill.) (Agent: If yes, when and what did the pharmacy advise?)  Preferred Pharmacy (with phone number or street name): [facility], [place] - [address] [place] [address] [phone] (Phone) [phone] (Fax)    Agent: Please be advised that RX refills may take up to 3 business days. We ask that you follow-up with your pharmacy.

## 2018-07-12 NOTE — Telephone Encounter (Signed)
Requested medication (s) are due for refill today: Yes  Requested medication (s) are on the active medication list: Yes  Last refill:  03/06/18  Future visit scheduled: Yes   Notes to clinic:  Unable to refill per protocol     Requested Prescriptions  Pending Prescriptions Disp Refills   testosterone cypionate (DEPOTESTOSTERONE CYPIONATE) 200 MG/ML injection 10 mL 3    Sig: Inject 1 mL (200 mg total) into the muscle every 14 (fourteen) days.     Off-Protocol Failed - 07/12/2018 11:58 AM      Failed - Medication not assigned to a protocol, review manually.      Passed - Valid encounter within last 12 months    Recent Outpatient Visits          3 months ago Left inguinal pain   [facility] [place], [place]   5 months ago Preventative health care   [facility] [facility] [place], [place]   8 months ago Colon cancer screening   [facility] [place], [place]   1 year ago Preventative health care   [place] [facility] [place], [place]   2 years ago Hyperlipidemia   [facility] [place], [place], [place]

## 2018-07-12 NOTE — Telephone Encounter (Signed)
I am just now getting around to calling some people and saw your message from last week.  Since we do not know what Dr. Otho Ket schedule, I am sending this back to you.

## 2018-07-12 NOTE — Telephone Encounter (Signed)
Patient not seen since August, can you please call him and tell him we will need to see him in office for eval before any meds can be called in?  SInce Dr Louanne Skye is not available, offer Dr Junius Roads next week.

## 2018-07-12 NOTE — Telephone Encounter (Signed)
Refill Request: testosterone cypionate  Last RX:03/06/18 Last OV:03/21/18 Next OV:07/19/18 UDS:01/25/18 CSC:01/25/18 CSR:

## 2018-07-13 MED ORDER — TESTOSTERONE CYPIONATE 200 MG/ML IM SOLN: 200.0000 mg | INTRAMUSCULAR | 1 refills | Status: DC

## 2018-07-13 NOTE — Telephone Encounter (Signed)
sent 

## 2018-07-17 ENCOUNTER — Ambulatory Visit: Payer: Medicare Other

## 2018-07-19 ENCOUNTER — Ambulatory Visit (INDEPENDENT_AMBULATORY_CARE_PROVIDER_SITE_OTHER): Payer: Medicare Other

## 2018-07-19 DIAGNOSIS — R7989 Other specified abnormal findings of blood chemistry: Secondary | ICD-10-CM | POA: Diagnosis not present

## 2018-07-19 MED ORDER — TESTOSTERONE CYPIONATE 200 MG/ML IM SOLN
200.0000 mg | Freq: Once | INTRAMUSCULAR | Status: AC
  Administered 2018-07-19: 200 mg via INTRAMUSCULAR

## 2018-07-19 NOTE — Progress Notes (Addendum)
Testosterone  Nursing note reviewed. Agree with documention and plan.

## 2018-07-23 ENCOUNTER — Encounter (INDEPENDENT_AMBULATORY_CARE_PROVIDER_SITE_OTHER): Payer: Self-pay | Admitting: Specialist

## 2018-07-23 ENCOUNTER — Other Ambulatory Visit: Payer: Self-pay | Admitting: Family Medicine

## 2018-07-23 ENCOUNTER — Other Ambulatory Visit (INDEPENDENT_AMBULATORY_CARE_PROVIDER_SITE_OTHER): Payer: Self-pay | Admitting: Radiology

## 2018-07-25 ENCOUNTER — Encounter (INDEPENDENT_AMBULATORY_CARE_PROVIDER_SITE_OTHER): Payer: Self-pay | Admitting: Specialist

## 2018-08-01 ENCOUNTER — Ambulatory Visit (INDEPENDENT_AMBULATORY_CARE_PROVIDER_SITE_OTHER): Payer: Medicare Other

## 2018-08-01 ENCOUNTER — Other Ambulatory Visit (INDEPENDENT_AMBULATORY_CARE_PROVIDER_SITE_OTHER): Payer: Self-pay | Admitting: Radiology

## 2018-08-01 ENCOUNTER — Encounter (INDEPENDENT_AMBULATORY_CARE_PROVIDER_SITE_OTHER): Payer: Self-pay | Admitting: Specialist

## 2018-08-01 DIAGNOSIS — R7989 Other specified abnormal findings of blood chemistry: Secondary | ICD-10-CM | POA: Diagnosis not present

## 2018-08-01 MED ORDER — TESTOSTERONE CYPIONATE 200 MG/ML IM SOLN
200.0000 mg | Freq: Once | INTRAMUSCULAR | Status: AC
  Administered 2018-08-01: 200 mg via INTRAMUSCULAR

## 2018-08-02 MED ORDER — TRAMADOL HCL 50 MG PO TABS: 50.0000 mg | ORAL_TABLET | Freq: Two times a day (BID) | ORAL | 0 refills | Status: DC | PRN

## 2018-08-15 ENCOUNTER — Ambulatory Visit (INDEPENDENT_AMBULATORY_CARE_PROVIDER_SITE_OTHER): Payer: Medicare Other

## 2018-08-15 DIAGNOSIS — E349 Endocrine disorder, unspecified: Secondary | ICD-10-CM | POA: Diagnosis not present

## 2018-08-15 MED ORDER — TESTOSTERONE CYPIONATE 200 MG/ML IM SOLN
200.0000 mg | Freq: Once | INTRAMUSCULAR | Status: AC
  Administered 2018-08-15: 200 mg via INTRAMUSCULAR

## 2018-08-15 NOTE — Progress Notes (Addendum)
Pt here today for testosterone injection. Pt supplies medication from pharmacy  15mL testosterone cypionate 200mg /mL injected into L hip. Pt tolerated injection well.   Next in 14 days. Nurse visit scheduled for 08/29/2018 at 0945.   Kathlene November, MD

## 2018-08-17 ENCOUNTER — Ambulatory Visit (INDEPENDENT_AMBULATORY_CARE_PROVIDER_SITE_OTHER): Payer: Medicare Other | Admitting: Family Medicine

## 2018-08-17 VITALS — BP 142/72 | HR 69 | Temp 98.4°F | Resp 18 | Wt 204.4 lb

## 2018-08-17 DIAGNOSIS — Z79899 Other long term (current) drug therapy: Secondary | ICD-10-CM

## 2018-08-17 DIAGNOSIS — N529 Male erectile dysfunction, unspecified: Secondary | ICD-10-CM

## 2018-08-17 DIAGNOSIS — R5383 Other fatigue: Secondary | ICD-10-CM

## 2018-08-17 DIAGNOSIS — E119 Type 2 diabetes mellitus without complications: Secondary | ICD-10-CM

## 2018-08-17 DIAGNOSIS — M5137 Other intervertebral disc degeneration, lumbosacral region: Secondary | ICD-10-CM

## 2018-08-17 DIAGNOSIS — G4700 Insomnia, unspecified: Secondary | ICD-10-CM

## 2018-08-17 DIAGNOSIS — R739 Hyperglycemia, unspecified: Secondary | ICD-10-CM

## 2018-08-17 DIAGNOSIS — I10 Essential (primary) hypertension: Secondary | ICD-10-CM | POA: Diagnosis not present

## 2018-08-17 DIAGNOSIS — M79604 Pain in right leg: Secondary | ICD-10-CM

## 2018-08-17 DIAGNOSIS — E349 Endocrine disorder, unspecified: Secondary | ICD-10-CM

## 2018-08-17 DIAGNOSIS — M51379 Other intervertebral disc degeneration, lumbosacral region without mention of lumbar back pain or lower extremity pain: Secondary | ICD-10-CM

## 2018-08-17 DIAGNOSIS — E782 Mixed hyperlipidemia: Secondary | ICD-10-CM | POA: Diagnosis not present

## 2018-08-17 DIAGNOSIS — M545 Low back pain: Secondary | ICD-10-CM

## 2018-08-17 LAB — TESTOSTERONE: Testosterone: 798.87 ng/dL (ref 300.00–890.00)

## 2018-08-17 LAB — CBC
HCT: 44.9 % (ref 39.0–52.0)
Hemoglobin: 14.9 g/dL (ref 13.0–17.0)
MCHC: 33.1 g/dL (ref 30.0–36.0)
MCV: 93.8 fl (ref 78.0–100.0)
Platelets: 343 10*3/uL (ref 150.0–400.0)
RBC: 4.79 Mil/uL (ref 4.22–5.81)
RDW: 14.1 % (ref 11.5–15.5)
WBC: 6.2 10*3/uL (ref 4.0–10.5)

## 2018-08-17 LAB — COMPREHENSIVE METABOLIC PANEL
ALT: 14 U/L (ref 0–53)
AST: 23 U/L (ref 0–37)
Albumin: 4.4 g/dL (ref 3.5–5.2)
Alkaline Phosphatase: 54 U/L (ref 39–117)
BUN: 18 mg/dL (ref 6–23)
CO2: 28 mEq/L (ref 19–32)
Calcium: 9.8 mg/dL (ref 8.4–10.5)
Chloride: 97 mEq/L (ref 96–112)
Creatinine, Ser: 0.95 mg/dL (ref 0.40–1.50)
GFR: 77.29 mL/min (ref >60.00)
Glucose, Bld: 160 mg/dL — ABNORMAL HIGH (ref 70–99)
Potassium: 4.5 mEq/L (ref 3.5–5.1)
Sodium: 133 mEq/L — ABNORMAL LOW (ref 135–145)
Total Bilirubin: 0.7 mg/dL (ref 0.2–1.2)
Total Protein: 6.5 g/dL (ref 6.0–8.3)

## 2018-08-17 LAB — LIPID PANEL
Cholesterol: 151 mg/dL (ref 0–200)
HDL: 64.1 mg/dL (ref >39.00)
LDL Cholesterol: 71 mg/dL (ref 0–99)
NonHDL: 86.64
Total CHOL/HDL Ratio: 2
Triglycerides: 78 mg/dL (ref 0.0–149.0)
VLDL: 15.6 mg/dL (ref 0.0–40.0)

## 2018-08-17 LAB — TSH: TSH: 5.48 u[IU]/mL — ABNORMAL HIGH (ref 0.35–4.50)

## 2018-08-17 LAB — HEMOGLOBIN A1C: Hgb A1c MFr Bld: 6.9 % — ABNORMAL HIGH (ref 4.6–6.5)

## 2018-08-17 NOTE — Assessment & Plan Note (Signed)
Well controlled, no changes to meds. Encouraged heart healthy diet such as the DASH diet and exercise as tolerated.  °

## 2018-08-17 NOTE — Assessment & Plan Note (Signed)
Doing better. Encouraged good sleep hygiene such as dark, quiet room. No blue/green glowing lights such as computer screens in bedroom. No alcohol or stimulants in evening. Cut down on caffeine as able. Regular exercise is helpful but not just prior to bed time.

## 2018-08-17 NOTE — Progress Notes (Signed)
Subjective:    [name], male    DOB: [birth year], 73 y.o.   MRN: [id number]  No chief complaint on file.   HPI  Patient is in today for 6 month follow up and labs visit. He is doing well overall and is starting to really feel better from his back surgery in 2018. He states he has been more tired lately than normal, taking naps in the afternoon and sleeping in later in the morning. He is also wondering if any of his current medications may be contributing to his ED and if he can potentially not take those medicines on days he wants to be sexually active. Otherwise he has no other complaints.   Patient Care Team: [name], MD as PCP - General (Family Medicine) Keene Breath., MD (Ophthalmology) Jessy Oto, MD as Consulting Physician (Orthopedic Surgery)   Past Medical History:  Diagnosis Date  . Acute pharyngitis 10/21/2013  . Allergy    grass and pollen  . Anxiety and depression 10/25/2011  . BPH (benign prostatic hyperplasia) 04/23/2012  . Chicken pox as a child  . DDD (degenerative disc disease)    cervical responds to steroid injections and low back required surgery  . DDD (degenerative disc disease), lumbosacral   . Diabetes mellitus    pre  . ED (erectile dysfunction) 04/23/2012  . Elevated BP   . Esophageal reflux 02/10/2015  . Fatigue   . HTN (hypertension)   . Hyperglycemia    preDM   . Hyperlipidemia   . Insomnia   . Low back pain radiating to both legs 01/16/2017  . Measles as a child  . Medicare annual wellness visit, subsequent 02/10/2015  . Overweight(278.02)   . Personal history of colonic polyps 10/27/2012   Follows with [facility]  . Preventative health care   . 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    Past Surgical History:  Procedure Laterality Date  . BACK SURGERY  2012 and 1994   Dr Margret Chance, screws and cage in low back  . TONSILLECTOMY    . torn rotator cuff  2010   right    Family History  Problem Relation Age of  Onset  . Hypertension Mother   . Diabetes Mother        type 2  . Cancer Mother 22       breast in remission  . Emphysema Father        smoker  . COPD Father        smoker  . Stroke Father 49       mini  . Heart disease Father   . Hypertension Sister   . Hyperlipidemia Sister   . Scoliosis Sister   . Osteoporosis Sister   . Hypertension Maternal Grandmother   . Scoliosis Maternal Grandmother   . Heart disease Maternal Grandfather   . Heart disease Paternal Grandfather        smoker  . Heart disease Daughter     Social History   Socioeconomic History  . Marital status: Married    Spouse name: Not on file  . Number of children: Not on file  . Years of education: Not on file  . Highest education level: Not on file  Occupational History  . Not on file  Social Needs  . Financial resource strain: Not on file  . Food insecurity:    Worry: Not on file    Inability: Not on file  . Transportation needs:  Medical: Not on file    Non-medical: Not on file  Tobacco Use  . Smoking status: Former Smoker    Packs/day: 1.00    Years: 20.00    Pack years: 20.00    Types: Cigarettes    Last attempt to quit: 07/18/1989    Years since quitting: 29.1  . Smokeless tobacco: Never Used  . Tobacco comment: off and on smoking for the 20 yrs  Substance and Sexual Activity  . Alcohol use: Yes    Comment: drink or two a day- mixed drink  . Drug use: No  . Sexual activity: Yes    Comment: lives with wife, still working, no dietary restrictions, continues to exercise intermittently  Lifestyle  . Physical activity:    Days per week: Not on file    Minutes per session: Not on file  . Stress: Not on file  Relationships  . Social connections:    Talks on phone: Not on file    Gets together: Not on file    Attends religious service: Not on file    Active member of club or organization: Not on file    Attends meetings of clubs or organizations: Not on file    Relationship status: Not  on file  . Intimate partner violence:    Fear of current or ex partner: Not on file    Emotionally abused: Not on file    Physically abused: Not on file    Forced sexual activity: Not on file  Other Topics Concern  . Not on file  Social History Narrative  . Not on file    Outpatient Medications Prior to Visit  Medication Sig Dispense Refill  . acyclovir (ZOVIRAX) 200 MG capsule TAKE ONE CAPSULE BY MOUTH 5 TIMES A DAY 25 capsule 1  . aspirin 81 MG tablet Take 81 mg by mouth daily.    Marland Kitchen atorvastatin (LIPITOR) 20 MG tablet TAKE 1/2 TABLET BY MOUTH DAILY AT 6 PM 45 tablet 0  . b complex vitamins tablet Take 1 tablet by mouth daily.    . celecoxib (CELEBREX) 200 MG capsule TAKE ONE CAPSULE BY MOUTH ONE TIME DAILY  90 capsule 0  . cyclobenzaprine (FLEXERIL) 5 MG tablet Take 1 tablet (5 mg total) by mouth at bedtime. 4 tablet 0  . diazepam (VALIUM) 10 MG tablet TAKE ONE TABLET BY MOUTH EVERY DAY AT BEDTIME AS NEEDED 30 tablet 1  . famotidine (PEPCID) 40 MG tablet Take 1 tablet (40 mg total) by mouth at bedtime. 90 tablet 1  . glucose blood (ONE TOUCH ULTRA TEST) test strip Check daily 32 each 1  . HYDROcodone-acetaminophen (NORCO) 7.5-325 MG tablet Take 1-2 tablets by mouth every 6 (six) hours as needed for moderate pain. 50 tablet 0  . lisinopril (PRINIVIL,ZESTRIL) 5 MG tablet TAKE 1 TABLET BY MOUTH EVERY DAY 90 tablet 1  . metFORMIN (GLUCOPHAGE) 500 MG tablet TAKE ONE TABLET BY MOUTH ONE TIME DAILY  90 tablet 0  . methocarbamol (ROBAXIN) 500 MG tablet Take 1 tablet (500 mg total) by mouth every 8 (eight) hours as needed. 60 tablet 1  . PROAIR HFA 108 (90 Base) MCG/ACT inhaler TAKE 1-2 PUFFS INHALE THREE TIMES A DAY (Patient taking differently: TAKE 1-2 PUFFS INHALE THREE TIMES A DAY AS NEEDED FOR SHORTNESS OF BREATH) 8.5 Inhaler 0  . sildenafil (REVATIO) 20 MG tablet TAKE ONE TO THREE TABLETS BY MOUTH DAILY AS NEEDED 50 tablet 2  . tamsulosin (FLOMAX) 0.4 MG CAPS capsule Take  2 capsules (0.8  mg total) by mouth daily. 180 capsule 1  . testosterone cypionate (DEPOTESTOSTERONE CYPIONATE) 200 MG/ML injection Inject 1 mL (200 mg total) into the muscle every 14 (fourteen) days. 10 mL 1  . traMADol (ULTRAM) 50 MG tablet Take 1 tablet (50 mg total) by mouth every 12 (twelve) hours as needed for moderate pain. 30 tablet 0  . venlafaxine XR (EFFEXOR-XR) 150 MG 24 hr capsule TAKE ONE CAPSULE BY MOUTH ONE TIME DAILY with breakfast 90 capsule 0  . cetirizine (ZYRTEC) 10 MG tablet Take 1 tablet (10 mg total) by mouth daily as needed for allergies or rhinitis. 30 tablet 11  . fluticasone (FLONASE) 50 MCG/ACT nasal spray Place 2 sprays into the nose daily. 48 g 1   No facility-administered medications prior to visit.     No Known Allergies  Review of Systems  Constitutional: Positive for malaise/fatigue. Negative for chills, fever and weight loss.  HENT: Negative for hearing loss and sore throat.   Eyes: Negative for blurred vision, double vision and photophobia.  Respiratory: Negative for cough and shortness of breath.   Cardiovascular: Negative for chest pain and palpitations.  Gastrointestinal: Negative for abdominal pain, constipation and diarrhea.  Genitourinary: Negative for dysuria, frequency and urgency.  Neurological: Negative for dizziness and headaches.       Objective:     Raunak Antuna appears his stated age, WDWN and in no acute distress.  Physical Exam Constitutional:      General: He is not in acute distress.    Appearance: Normal appearance.  HENT:     Head: Normocephalic and atraumatic.     Nose: Nose normal.     Mouth/Throat:     Mouth: Mucous membranes are moist.     Pharynx: Oropharynx is clear.  Neck:     Musculoskeletal: Neck supple.  Cardiovascular:     Rate and Rhythm: Normal rate and regular rhythm.     Heart sounds: Normal heart sounds.  Pulmonary:     Effort: Pulmonary effort is normal.     Breath sounds: Normal breath sounds.  Abdominal:      General: Bowel sounds are normal.  Neurological:     Mental Status: He is alert and oriented to person, place, and time.  Psychiatric:        Mood and Affect: Mood normal.        Behavior: Behavior normal.     BP (!) 142/72 (BP Location: Left Arm, Patient Position: Sitting, Cuff Size: Normal)   Pulse 69   Temp 98.4 F (36.9 C) (Oral)   Resp 18   Wt 92.7 kg   SpO2 98%   BMI 29.33 kg/m  Wt Readings from Last 3 Encounters:  08/17/18 92.7 kg  03/27/18 86.2 kg  03/21/18 90.4 kg   BP Readings from Last 3 Encounters:  08/17/18 (!) 142/72  03/27/18 131/81  03/21/18 (!) 146/58     Immunization History  Administered Date(s) Administered  . Influenza Split 04/23/2012  . Influenza Whole 02/16/2011  . Influenza, High Dose Seasonal PF 05/06/2013, 03/25/2016, 05/05/2017, 06/07/2018  . Pneumococcal Conjugate-13 03/25/2016  . Pneumococcal Polysaccharide-23 10/19/2011  . Td 07/18/2008  . Zoster 04/27/2016    Health Maintenance  Topic Date Due  . OPHTHALMOLOGY EXAM  04/17/2017  . FOOT EXAM  08/23/2017  . TETANUS/TDAP  07/18/2018  . HEMOGLOBIN A1C  07/28/2018  . COLONOSCOPY  09/05/2022  . INFLUENZA VACCINE  Completed  . Hepatitis C Screening  Completed  . PNA  vac Low Risk Adult  Completed    Lab Results  Component Value Date   WBC 5.7 01/25/2018   HGB 14.5 01/25/2018   HCT 42.6 01/25/2018   PLT 327.0 01/25/2018   GLUCOSE 139 (H) 01/25/2018   CHOL 152 01/25/2018   TRIG 142.0 01/25/2018   HDL 68.00 01/25/2018   LDLDIRECT 64.0 10/30/2017   LDLCALC 55 01/25/2018   ALT 13 01/25/2018   AST 24 01/25/2018   NA 139 01/25/2018   K 4.4 01/25/2018   CL 101 01/25/2018   CREATININE 0.97 01/25/2018   BUN 13 01/25/2018   CO2 30 01/25/2018   TSH 2.18 01/25/2018   PSA 0.46 01/25/2018   INR 1.00 05/17/2017   HGBA1C 6.9 (H) 01/25/2018   MICROALBUR 0.61 04/23/2012    Lab Results  Component Value Date   TSH 2.18 01/25/2018   Lab Results  Component Value Date   WBC 5.7  01/25/2018   HGB 14.5 01/25/2018   HCT 42.6 01/25/2018   MCV 97.5 01/25/2018   PLT 327.0 01/25/2018   Lab Results  Component Value Date   NA 139 01/25/2018   K 4.4 01/25/2018   CO2 30 01/25/2018   GLUCOSE 139 (H) 01/25/2018   BUN 13 01/25/2018   CREATININE 0.97 01/25/2018   BILITOT 0.6 01/25/2018   ALKPHOS 60 01/25/2018   AST 24 01/25/2018   ALT 13 01/25/2018   PROT 6.4 01/25/2018   ALBUMIN 4.1 01/25/2018   CALCIUM 9.3 01/25/2018   ANIONGAP 7 05/24/2017   GFR 80.32 01/25/2018   Lab Results  Component Value Date   CHOL 152 01/25/2018   Lab Results  Component Value Date   HDL 68.00 01/25/2018   Lab Results  Component Value Date   LDLCALC 55 01/25/2018   Lab Results  Component Value Date   TRIG 142.0 01/25/2018   Lab Results  Component Value Date   CHOLHDL 2 01/25/2018   Lab Results  Component Value Date   HGBA1C 6.9 (H) 01/25/2018         Assessment & Plan:   Problems Addressed this Visit  HTN: Fairly well controlled. Pt states they have been in 277O-242P systolic at home. Encouraged relaxation and heart healthy diet as well as increasing exercise as tolerated. - orders: CBC, CMP, TSH  Hyperlipidemia: Well controlled per last labs. Pt has been maintaining an active lifestyle as well as cutting down on red meats. Encouraged continued heart healthy diet and exercise as tolerated. - orders: lipid panel  Diabetes Mellitus type 2: Most recent labs revealed a1c of 6.9, classifying pt as type 2 diabetic. Encouraged heart healthy diet and exercise, minimizing simple carbs. Pt is tolerating metformin. - orders: HgB A1c  Insomnia: Doing better. Pt is using diazepam about every 3rd night but is sleeping well on the nights he is not. He only takes the diazepam if he cannot fall asleep that evening, to initiate sleep. Encouraged regular sleep schedule, decreasing blue light before sleep, and good sleep hygiene.   Fatigue: Pt is taking more naps and sleeping longer.  Explained that this is not a bad thing, given his age, and there is evidence that napping is healthy for the heart. Check testosterone level to see if it has dropped again. - orders: testosterone level  ED: Pt still struggling with ED even with taking sildenafil. Explained he can take up to 100mg  at a time. Also explained that coming off the tramadol and switching to tylenol may help decrease symptoms of ED and  could even help his testosterone level. - orders: testosterone level  Testosterone Deficiency: Pt has chronic testosterone deficiency which has been improving with testosterone shots and cream. Recheck today to monitor levels. - orders: testosterone level  Low back pain: Pt is still having low back pain following surgery but it is slowly improving. The radiation and numbness in his legs has gone away completely.  High Risk Med usage: Pt has signed agreement for controlled substances. - Orders: pain managment  I am having [place]. [place]en[place]ink maintain his aspirin, b complex vitamins, cetirizine, fluticasone, glucose blood, PROAIR HFA, HYDROcodone-acetaminophen, tamsulosin, sildenafil, lisinopril, cyclobenzaprine, methocarbamol, diazepam, acyclovir, atorvastatin, celecoxib, famotidine, testosterone cypionate, metFORMIN, venlafaxine XR, and traMADol.  No orders of the defined types were placed in this encounter.   Court Joy, Student-PA

## 2018-08-17 NOTE — Patient Instructions (Addendum)
Shingrix is the new shingles vaccine. 2 shots over 2-6 months. Can get it at the pharmacy.   Substitute Tylenol/Acetaminophen ES 500 mg tabs (MAX 3000 mg in 24 hours) for the Tramadol  Revatio can take up to 5 tabs  Levitra and Cialis  CoverMyMeds and GoodRx  The Blue Zones   Hypertension Hypertension, commonly called high blood pressure, is when the force of blood pumping through the arteries is too strong. The arteries are the blood vessels that carry blood from the heart throughout the body. Hypertension forces the heart to work harder to pump blood and may cause arteries to become narrow or stiff. Having untreated or uncontrolled hypertension can cause heart attacks, strokes, kidney disease, and other problems. A blood pressure reading consists of a higher number over a lower number. Ideally, your blood pressure should be below 120/80. The first ("top") number is called the systolic pressure. It is a measure of the pressure in your arteries as your heart beats. The second ("bottom") number is called the diastolic pressure. It is a measure of the pressure in your arteries as the heart relaxes. What are the causes? The cause of this condition is not known. What increases the risk? Some risk factors for high blood pressure are under your control. Others are not. Factors you can change  Smoking.  Having type 2 diabetes mellitus, high cholesterol, or both.  Not getting enough exercise or physical activity.  Being overweight.  Having too much fat, sugar, calories, or salt (sodium) in your diet.  Drinking too much alcohol. Factors that are difficult or impossible to change  Having chronic kidney disease.  Having a family history of high blood pressure.  Age. Risk increases with age.  Race. You may be at higher risk if you are African-American.  Gender. Men are at higher risk than women before age 69. After age 31, women are at higher risk than men.  Having obstructive sleep  apnea.  Stress. What are the signs or symptoms? Extremely high blood pressure (hypertensive crisis) may cause:  Headache.  Anxiety.  Shortness of breath.  Nosebleed.  Nausea and vomiting.  Severe chest pain.  Jerky movements you cannot control (seizures). How is this diagnosed? This condition is diagnosed by measuring your blood pressure while you are seated, with your arm resting on a surface. The cuff of the blood pressure monitor will be placed directly against the skin of your upper arm at the level of your heart. It should be measured at least twice using the same arm. Certain conditions can cause a difference in blood pressure between your right and left arms. Certain factors can cause blood pressure readings to be lower or higher than normal (elevated) for a short period of time:  When your blood pressure is higher when you are in a health care provider's office than when you are at home, this is called white coat hypertension. Most people with this condition do not need medicines.  When your blood pressure is higher at home than when you are in a health care provider's office, this is called masked hypertension. Most people with this condition may need medicines to control blood pressure. If you have a high blood pressure reading during one visit or you have normal blood pressure with other risk factors:  You may be asked to return on a different day to have your blood pressure checked again.  You may be asked to monitor your blood pressure at home for 1 week or longer.  If you are diagnosed with hypertension, you may have other blood or imaging tests to help your health care provider understand your overall risk for other conditions. How is this treated? This condition is treated by making healthy lifestyle changes, such as eating healthy foods, exercising more, and reducing your alcohol intake. Your health care provider may prescribe medicine if lifestyle changes are not  enough to get your blood pressure under control, and if:  Your systolic blood pressure is above 130.  Your diastolic blood pressure is above 80. Your personal target blood pressure may vary depending on your medical conditions, your age, and other factors. Follow these instructions at home: Eating and drinking   Eat a diet that is high in fiber and potassium, and low in sodium, added sugar, and fat. An example eating plan is called the DASH (Dietary Approaches to Stop Hypertension) diet. To eat this way: ? Eat plenty of fresh fruits and vegetables. Try to fill half of your plate at each meal with fruits and vegetables. ? Eat whole grains, such as whole wheat pasta, brown rice, or whole grain bread. Fill about one quarter of your plate with whole grains. ? Eat or drink low-fat dairy products, such as skim milk or low-fat yogurt. ? Avoid fatty cuts of meat, processed or cured meats, and poultry with skin. Fill about one quarter of your plate with lean proteins, such as fish, chicken without skin, beans, eggs, and tofu. ? Avoid premade and processed foods. These tend to be higher in sodium, added sugar, and fat.  Reduce your daily sodium intake. Most people with hypertension should eat less than 1,500 mg of sodium a day.  Limit alcohol intake to no more than 1 drink a day for nonpregnant women and 2 drinks a day for men. One drink equals 12 oz of beer, 5 oz of wine, or 1 oz of hard liquor. Lifestyle   Work with your health care provider to maintain a healthy body weight or to lose weight. Ask what an ideal weight is for you.  Get at least 30 minutes of exercise that causes your heart to beat faster (aerobic exercise) most days of the week. Activities may include walking, swimming, or biking.  Include exercise to strengthen your muscles (resistance exercise), such as pilates or lifting weights, as part of your weekly exercise routine. Try to do these types of exercises for 30 minutes at least  3 days a week.  Do not use any products that contain nicotine or tobacco, such as cigarettes and e-cigarettes. If you need help quitting, ask your health care provider.  Monitor your blood pressure at home as told by your health care provider.  Keep all follow-up visits as told by your health care provider. This is important. Medicines  Take over-the-counter and prescription medicines only as told by your health care provider. Follow directions carefully. Blood pressure medicines must be taken as prescribed.  Do not skip doses of blood pressure medicine. Doing this puts you at risk for problems and can make the medicine less effective.  Ask your health care provider about side effects or reactions to medicines that you should watch for. Contact a health care provider if:  You think you are having a reaction to a medicine you are taking.  You have headaches that keep coming back (recurring).  You feel dizzy.  You have swelling in your ankles.  You have trouble with your vision. Get help right away if:  You develop a severe  headache or confusion.  You have unusual weakness or numbness.  You feel faint.  You have severe pain in your chest or abdomen.  You vomit repeatedly.  You have trouble breathing. Summary  Hypertension is when the force of blood pumping through your arteries is too strong. If this condition is not controlled, it may put you at risk for serious complications.  Your personal target blood pressure may vary depending on your medical conditions, your age, and other factors. For most people, a normal blood pressure is less than 120/80.  Hypertension is treated with lifestyle changes, medicines, or a combination of both. Lifestyle changes include weight loss, eating a healthy, low-sodium diet, exercising more, and limiting alcohol. This information is not intended to replace advice given to you by your health care provider. Make sure you discuss any questions  you have with your health care provider. Document Released: 07/04/2005 Document Revised: 06/01/2016 Document Reviewed: 06/01/2016 Elsevier Interactive Patient Education  2019 [employer].

## 2018-08-17 NOTE — Assessment & Plan Note (Signed)
Tolerating statin, encouraged heart healthy diet, avoid trans fats, minimize simple carbs and saturated fats. Increase exercise as tolerated 

## 2018-08-17 NOTE — Assessment & Plan Note (Signed)
hgba1c acceptable, minimize simple carbs. Increase exercise as tolerated. Continue current meds 

## 2018-08-17 NOTE — Assessment & Plan Note (Signed)
>>  ASSESSMENT AND PLAN FOR TESTOSTERONE DEFICIENCY WRITTEN ON 08/17/2018  9:03 AM BY BLYTH, STACEY A, MD  Check level

## 2018-08-17 NOTE — Assessment & Plan Note (Signed)
Doing better still sore but radicular symptoms gone since surgery

## 2018-08-17 NOTE — Assessment & Plan Note (Signed)
Discussed options and he will investigate medication options and try and minimize pain meds

## 2018-08-17 NOTE — Assessment & Plan Note (Signed)
Check level 

## 2018-08-18 NOTE — Assessment & Plan Note (Signed)
Back is improved but still painful since surgery encouraged to stay as active as able without overdoing it.

## 2018-08-18 NOTE — Progress Notes (Signed)
Subjective:    [name], male    DOB: [birth year], 73 y.o.   MRN: [id number]  No chief complaint on file.   HPI Patient is in today for follow up. He continues to try and maintain a heart healthy diet and stay active. No recent febrile illness or hospitalizations. He is sleeping better. His back pain is persistent but improved since surgery no incontinence. Does note ED is not working ideally. Denies CP/palp/SOB/HA/congestion/fevers or GU c/o. Taking meds as prescribed  Past Medical History:  Diagnosis Date  . Acute pharyngitis 10/21/2013  . Allergy    grass and pollen  . Anxiety and depression 10/25/2011  . BPH (benign prostatic hyperplasia) 04/23/2012  . Chicken pox as a child  . DDD (degenerative disc disease)    cervical responds to steroid injections and low back required surgery  . DDD (degenerative disc disease), lumbosacral   . Diabetes mellitus    pre  . ED (erectile dysfunction) 04/23/2012  . Elevated BP   . Esophageal reflux 02/10/2015  . Fatigue   . HTN (hypertension)   . Hyperglycemia    preDM   . Hyperlipidemia   . Insomnia   . Low back pain radiating to both legs 01/16/2017  . Measles as a child  . Medicare annual wellness visit, subsequent 02/10/2015  . Overweight(278.02)   . Personal history of colonic polyps 10/27/2012   Follows with [facility]  . Preventative health care   . 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    Past Surgical History:  Procedure Laterality Date  . BACK SURGERY  2012 and 1994   Dr Margret Chance, screws and cage in low back  . TONSILLECTOMY    . torn rotator cuff  2010   right    Family History  Problem Relation Age of Onset  . Hypertension Mother   . Diabetes Mother        type 2  . Cancer Mother 42       breast in remission  . Emphysema Father        smoker  . COPD Father        smoker  . Stroke Father 86       mini  . Heart disease Father   . Hypertension Sister   . Hyperlipidemia Sister   . Scoliosis  Sister   . Osteoporosis Sister   . Hypertension Maternal Grandmother   . Scoliosis Maternal Grandmother   . Heart disease Maternal Grandfather   . Heart disease Paternal Grandfather        smoker  . Heart disease Daughter     Social History   Socioeconomic History  . Marital status: Married    Spouse name: Not on file  . Number of children: Not on file  . Years of education: Not on file  . Highest education level: Not on file  Occupational History  . Not on file  Social Needs  . Financial resource strain: Not on file  . Food insecurity:    Worry: Not on file    Inability: Not on file  . Transportation needs:    Medical: Not on file    Non-medical: Not on file  Tobacco Use  . Smoking status: Former Smoker    Packs/day: 1.00    Years: 20.00    Pack years: 20.00    Types: Cigarettes    Last attempt to quit: 07/18/1989    Years since quitting: 29.1  . Smokeless tobacco: Never Used  .  Tobacco comment: off and on smoking for the 20 yrs  Substance and Sexual Activity  . Alcohol use: Yes    Comment: drink or two a day- mixed drink  . Drug use: No  . Sexual activity: Yes    Comment: lives with wife, still working, no dietary restrictions, continues to exercise intermittently  Lifestyle  . Physical activity:    Days per week: Not on file    Minutes per session: Not on file  . Stress: Not on file  Relationships  . Social connections:    Talks on phone: Not on file    Gets together: Not on file    Attends religious service: Not on file    Active member of club or organization: Not on file    Attends meetings of clubs or organizations: Not on file    Relationship status: Not on file  . Intimate partner violence:    Fear of current or ex partner: Not on file    Emotionally abused: Not on file    Physically abused: Not on file    Forced sexual activity: Not on file  Other Topics Concern  . Not on file  Social History Narrative  . Not on file    Outpatient  Medications Prior to Visit  Medication Sig Dispense Refill  . acyclovir (ZOVIRAX) 200 MG capsule TAKE ONE CAPSULE BY MOUTH 5 TIMES A DAY 25 capsule 1  . aspirin 81 MG tablet Take 81 mg by mouth daily.    Marland Kitchen atorvastatin (LIPITOR) 20 MG tablet TAKE 1/2 TABLET BY MOUTH DAILY AT 6 PM 45 tablet 0  . b complex vitamins tablet Take 1 tablet by mouth daily.    . celecoxib (CELEBREX) 200 MG capsule TAKE ONE CAPSULE BY MOUTH ONE TIME DAILY  90 capsule 0  . cyclobenzaprine (FLEXERIL) 5 MG tablet Take 1 tablet (5 mg total) by mouth at bedtime. 4 tablet 0  . diazepam (VALIUM) 10 MG tablet TAKE ONE TABLET BY MOUTH EVERY DAY AT BEDTIME AS NEEDED 30 tablet 1  . famotidine (PEPCID) 40 MG tablet Take 1 tablet (40 mg total) by mouth at bedtime. 90 tablet 1  . glucose blood (ONE TOUCH ULTRA TEST) test strip Check daily 32 each 1  . HYDROcodone-acetaminophen (NORCO) 7.5-325 MG tablet Take 1-2 tablets by mouth every 6 (six) hours as needed for moderate pain. 50 tablet 0  . lisinopril (PRINIVIL,ZESTRIL) 5 MG tablet TAKE 1 TABLET BY MOUTH EVERY DAY 90 tablet 1  . metFORMIN (GLUCOPHAGE) 500 MG tablet TAKE ONE TABLET BY MOUTH ONE TIME DAILY  90 tablet 0  . methocarbamol (ROBAXIN) 500 MG tablet Take 1 tablet (500 mg total) by mouth every 8 (eight) hours as needed. 60 tablet 1  . PROAIR HFA 108 (90 Base) MCG/ACT inhaler TAKE 1-2 PUFFS INHALE THREE TIMES A DAY (Patient taking differently: TAKE 1-2 PUFFS INHALE THREE TIMES A DAY AS NEEDED FOR SHORTNESS OF BREATH) 8.5 Inhaler 0  . sildenafil (REVATIO) 20 MG tablet TAKE ONE TO THREE TABLETS BY MOUTH DAILY AS NEEDED 50 tablet 2  . tamsulosin (FLOMAX) 0.4 MG CAPS capsule Take 2 capsules (0.8 mg total) by mouth daily. 180 capsule 1  . testosterone cypionate (DEPOTESTOSTERONE CYPIONATE) 200 MG/ML injection Inject 1 mL (200 mg total) into the muscle every 14 (fourteen) days. 10 mL 1  . traMADol (ULTRAM) 50 MG tablet Take 1 tablet (50 mg total) by mouth every 12 (twelve) hours as  needed for moderate pain. 30 tablet 0  .  venlafaxine XR (EFFEXOR-XR) 150 MG 24 hr capsule TAKE ONE CAPSULE BY MOUTH ONE TIME DAILY with breakfast 90 capsule 0  . cetirizine (ZYRTEC) 10 MG tablet Take 1 tablet (10 mg total) by mouth daily as needed for allergies or rhinitis. 30 tablet 11  . fluticasone (FLONASE) 50 MCG/ACT nasal spray Place 2 sprays into the nose daily. 48 g 1   No facility-administered medications prior to visit.     No Known Allergies  Review of Systems  Constitutional: Negative for fever and malaise/fatigue.  HENT: Negative for congestion.   Eyes: Negative for blurred vision.  Respiratory: Negative for shortness of breath.   Cardiovascular: Negative for chest pain, palpitations and leg swelling.  Gastrointestinal: Negative for abdominal pain, blood in stool and nausea.  Genitourinary: Negative for dysuria and frequency.  Musculoskeletal: Positive for back pain. Negative for falls.  Skin: Negative for rash.  Neurological: Negative for dizziness, loss of consciousness and headaches.  Endo/Heme/Allergies: Negative for environmental allergies.  Psychiatric/Behavioral: Negative for depression. The patient is not nervous/anxious.        Objective:    Physical Exam Vitals signs and nursing note reviewed.  Constitutional:      General: He is not in acute distress.    Appearance: He is well-developed.  HENT:     Head: Normocephalic and atraumatic.     Nose: Nose normal.  Eyes:     General:        Right eye: No discharge.        Left eye: No discharge.  Neck:     Musculoskeletal: Normal range of motion and neck supple.  Cardiovascular:     Rate and Rhythm: Normal rate and regular rhythm.     Heart sounds: No murmur.  Pulmonary:     Effort: Pulmonary effort is normal.     Breath sounds: Normal breath sounds.  Abdominal:     General: Bowel sounds are normal.     Palpations: Abdomen is soft.     Tenderness: There is no abdominal tenderness.  Skin:     General: Skin is warm and dry.  Neurological:     Mental Status: He is alert and oriented to person, place, and time.     BP (!) 142/72 (BP Location: Left Arm, Patient Position: Sitting, Cuff Size: Normal)   Pulse 69   Temp 98.4 F (36.9 C) (Oral)   Resp 18   Wt 204 lb 6.4 oz (92.7 kg)   SpO2 98%   BMI 29.33 kg/m  Wt Readings from Last 3 Encounters:  08/17/18 204 lb 6.4 oz (92.7 kg)  03/27/18 190 lb (86.2 kg)  03/21/18 199 lb 3.2 oz (90.4 kg)     Lab Results  Component Value Date   WBC 6.2 08/17/2018   HGB 14.9 08/17/2018   HCT 44.9 08/17/2018   PLT 343.0 08/17/2018   GLUCOSE 160 (H) 08/17/2018   CHOL 151 08/17/2018   TRIG 78.0 08/17/2018   HDL 64.10 08/17/2018   LDLDIRECT 64.0 10/30/2017   LDLCALC 71 08/17/2018   ALT 14 08/17/2018   AST 23 08/17/2018   NA 133 (L) 08/17/2018   K 4.5 08/17/2018   CL 97 08/17/2018   CREATININE 0.95 08/17/2018   BUN 18 08/17/2018   CO2 28 08/17/2018   TSH 5.48 (H) 08/17/2018   PSA 0.46 01/25/2018   INR 1.00 05/17/2017   HGBA1C 6.9 (H) 08/17/2018   MICROALBUR 0.61 04/23/2012    Lab Results  Component Value Date   TSH  5.48 (H) 08/17/2018   Lab Results  Component Value Date   WBC 6.2 08/17/2018   HGB 14.9 08/17/2018   HCT 44.9 08/17/2018   MCV 93.8 08/17/2018   PLT 343.0 08/17/2018   Lab Results  Component Value Date   NA 133 (L) 08/17/2018   K 4.5 08/17/2018   CO2 28 08/17/2018   GLUCOSE 160 (H) 08/17/2018   BUN 18 08/17/2018   CREATININE 0.95 08/17/2018   BILITOT 0.7 08/17/2018   ALKPHOS 54 08/17/2018   AST 23 08/17/2018   ALT 14 08/17/2018   PROT 6.5 08/17/2018   ALBUMIN 4.4 08/17/2018   CALCIUM 9.8 08/17/2018   ANIONGAP 7 05/24/2017   GFR 77.29 08/17/2018   Lab Results  Component Value Date   CHOL 151 08/17/2018   Lab Results  Component Value Date   HDL 64.10 08/17/2018   Lab Results  Component Value Date   LDLCALC 71 08/17/2018   Lab Results  Component Value Date   TRIG 78.0 08/17/2018    Lab Results  Component Value Date   CHOLHDL 2 08/17/2018   Lab Results  Component Value Date   HGBA1C 6.9 (H) 08/17/2018       Assessment & Plan:   Problem List Items Addressed This Visit    DDD (degenerative disc disease), lumbosacral    Back is improved but still painful since surgery encouraged to stay as active as able without overdoing it.       HTN (hypertension) - Primary    Well controlled, no changes to meds. Encouraged heart healthy diet such as the DASH diet and exercise as tolerated.       Relevant Orders   CBC (Completed)   Comp Met (CMET) (Completed)   TSH (Completed)   Diabetes mellitus without complication (HCC)    JJHE1D acceptable, minimize simple carbs. Increase exercise as tolerated. Continue current meds      Hyperlipidemia    Tolerating statin, encouraged heart healthy diet, avoid trans fats, minimize simple carbs and saturated fats. Increase exercise as tolerated      Relevant Orders   Lipid Profile (Completed)   Insomnia    Doing better. Encouraged good sleep hygiene such as dark, quiet room. No blue/green glowing lights such as computer screens in bedroom. No alcohol or stimulants in evening. Cut down on caffeine as able. Regular exercise is helpful but not just prior to bed time.       Fatigue   Relevant Orders   Testosterone (Completed)   ED (erectile dysfunction)    Discussed options and he will investigate medication options and try and minimize pain meds      Testosterone deficiency    Check level      Relevant Orders   Testosterone (Completed)   Low back pain radiating to both legs    Doing better still sore but radicular symptoms gone since surgery       Other Visit Diagnoses    High risk medication use       Relevant Orders   Pain Mgmt, Profile 8 w/Conf, U      I am having [facility] E. Gens maintain his aspirin, b complex vitamins, cetirizine, fluticasone, glucose blood, PROAIR HFA, HYDROcodone-acetaminophen,  tamsulosin, sildenafil, lisinopril, cyclobenzaprine, methocarbamol, diazepam, acyclovir, atorvastatin, celecoxib, famotidine, testosterone cypionate, metFORMIN, venlafaxine XR, and traMADol.  No orders of the defined types were placed in this encounter.    Penni Homans, MD

## 2018-08-19 LAB — PAIN MGMT, PROFILE 8 W/CONF, U
6 Acetylmorphine: NEGATIVE ng/mL (ref <10)
Alcohol Metabolites: POSITIVE ng/mL — AB (ref <500)
Alphahydroxyalprazolam: NEGATIVE ng/mL (ref <25)
Alphahydroxymidazolam: NEGATIVE ng/mL (ref <50)
Alphahydroxytriazolam: NEGATIVE ng/mL (ref <50)
Aminoclonazepam: NEGATIVE ng/mL (ref <25)
Amphetamines: NEGATIVE ng/mL (ref <500)
Benzodiazepines: POSITIVE ng/mL — AB (ref <100)
Buprenorphine, Urine: NEGATIVE ng/mL (ref <5)
Cocaine Metabolite: NEGATIVE ng/mL (ref <150)
Creatinine: 145.5 mg/dL
Ethyl Glucuronide (ETG): 49814 ng/mL — ABNORMAL HIGH (ref <500)
Ethyl Sulfate (ETS): 8281 ng/mL — ABNORMAL HIGH (ref <100)
Hydroxyethylflurazepam: NEGATIVE ng/mL (ref <50)
Lorazepam: NEGATIVE ng/mL (ref <50)
MDMA: NEGATIVE ng/mL (ref <500)
Marijuana Metabolite: NEGATIVE ng/mL (ref <20)
Nordiazepam: 295 ng/mL — ABNORMAL HIGH (ref <50)
Opiates: NEGATIVE ng/mL (ref <100)
Oxazepam: 744 ng/mL — ABNORMAL HIGH (ref <50)
Oxidant: NEGATIVE ug/mL (ref <200)
Oxycodone: NEGATIVE ng/mL (ref <100)
Temazepam: 504 ng/mL — ABNORMAL HIGH (ref <50)
pH: 7.03 (ref 4.5–9.0)

## 2018-08-21 NOTE — Addendum Note (Signed)
Addended by: Magdalene Molly A on: 08/21/2018 11:58 AM   Modules accepted: Orders

## 2018-08-23 ENCOUNTER — Other Ambulatory Visit: Payer: Self-pay | Admitting: Family Medicine

## 2018-08-29 ENCOUNTER — Ambulatory Visit (INDEPENDENT_AMBULATORY_CARE_PROVIDER_SITE_OTHER): Payer: Medicare Other

## 2018-08-29 DIAGNOSIS — E349 Endocrine disorder, unspecified: Secondary | ICD-10-CM | POA: Diagnosis not present

## 2018-08-29 MED ORDER — TESTOSTERONE CYPIONATE 200 MG/ML IM SOLN
200.0000 mg | Freq: Once | INTRAMUSCULAR | Status: AC
  Administered 2018-08-29: 200 mg via INTRAMUSCULAR

## 2018-08-29 NOTE — Progress Notes (Addendum)
Pre visit review using our clinic review tool, if applicable. No additional management support is needed unless otherwise documented below in the visit note.  Pt here today for testosterone injection. Pt supplies medication from pharmacy.   42mL testosterone cypionate 200mg /mL injected into R hip. Pt tolerated injection well.   Next in 14 days. Appt scheduled 09/12/2018.   Kathlene November, MD

## 2018-08-30 ENCOUNTER — Telehealth: Payer: Self-pay | Admitting: Family Medicine

## 2018-08-30 NOTE — Telephone Encounter (Signed)
Copied from [place] [phone]. Topic: Quick Communication - Rx Refill/Question >> Aug 30, 2018 11:10 AM Luis [name] wrote: Medication: pt called in and stated that he needs [name] refill on on his topical Testosterone gel pack.  I did not see this on his med list.  I seen injection but not the topical.  He stated that he as been using this for [name] couple of year now   Has the patient contacted their pharmacy?yes  (Agent: If no, request that the patient contact the pharmacy for the refill.) (Agent: If yes, when and what did the pharmacy advise?)  Preferred Pharmacy (with phone number or street name): [id number] - [place], [address]  Agent: Please be advised that RX refills may take up to 3 business days. We ask that you follow-up with your pharmacy.

## 2018-09-03 ENCOUNTER — Other Ambulatory Visit: Payer: Self-pay

## 2018-09-03 NOTE — Progress Notes (Signed)
Ok to refill 

## 2018-09-03 NOTE — Telephone Encounter (Signed)
Medication request sent to pcp

## 2018-09-03 NOTE — Progress Notes (Signed)
Requesting:testosterone  Contract:yes UDS:low risk next screen 02/15/19 Last OV:08/17/18 Next OV:not scheduled  Last Refill:07/13/18  #25ml-1rf Database:   Please advise    Please send in

## 2018-09-04 ENCOUNTER — Encounter: Payer: Self-pay | Admitting: Family Medicine

## 2018-09-04 ENCOUNTER — Other Ambulatory Visit (INDEPENDENT_AMBULATORY_CARE_PROVIDER_SITE_OTHER): Payer: Medicare Other

## 2018-09-04 ENCOUNTER — Other Ambulatory Visit: Payer: Self-pay | Admitting: Family Medicine

## 2018-09-04 DIAGNOSIS — E349 Endocrine disorder, unspecified: Secondary | ICD-10-CM | POA: Diagnosis not present

## 2018-09-04 DIAGNOSIS — I10 Essential (primary) hypertension: Secondary | ICD-10-CM | POA: Diagnosis not present

## 2018-09-04 LAB — COMPREHENSIVE METABOLIC PANEL
ALT: 14 U/L (ref 0–53)
AST: 25 U/L (ref 0–37)
Albumin: 4.2 g/dL (ref 3.5–5.2)
Alkaline Phosphatase: 56 U/L (ref 39–117)
BUN: 22 mg/dL (ref 6–23)
CO2: 29 mEq/L (ref 19–32)
Calcium: 9 mg/dL (ref 8.4–10.5)
Chloride: 101 mEq/L (ref 96–112)
Creatinine, Ser: 1.06 mg/dL (ref 0.40–1.50)
GFR: 68.1 mL/min (ref >60.00)
Glucose, Bld: 165 mg/dL — ABNORMAL HIGH (ref 70–99)
Potassium: 4.5 mEq/L (ref 3.5–5.1)
Sodium: 137 mEq/L (ref 135–145)
Total Bilirubin: 0.8 mg/dL (ref 0.2–1.2)
Total Protein: 6.4 g/dL (ref 6.0–8.3)

## 2018-09-04 LAB — T4, FREE: Free T4: 0.64 ng/dL (ref 0.60–1.60)

## 2018-09-05 ENCOUNTER — Other Ambulatory Visit (INDEPENDENT_AMBULATORY_CARE_PROVIDER_SITE_OTHER): Payer: Self-pay | Admitting: Specialist

## 2018-09-05 NOTE — Telephone Encounter (Signed)
Tramadol refill

## 2018-09-05 NOTE — Telephone Encounter (Signed)
Celecoxib refill request  

## 2018-09-10 ENCOUNTER — Encounter (INDEPENDENT_AMBULATORY_CARE_PROVIDER_SITE_OTHER): Payer: Self-pay | Admitting: Specialist

## 2018-09-10 ENCOUNTER — Encounter: Payer: Self-pay | Admitting: Family Medicine

## 2018-09-11 ENCOUNTER — Other Ambulatory Visit: Payer: Self-pay | Admitting: Family Medicine

## 2018-09-11 ENCOUNTER — Encounter (INDEPENDENT_AMBULATORY_CARE_PROVIDER_SITE_OTHER): Payer: Self-pay | Admitting: Specialist

## 2018-09-11 NOTE — Telephone Encounter (Signed)
Last Diazepam RX: 04/10/18, #30 x 1 refill Last OV: 08/17/18 Next OV: due 02/15/19, not yet scheduled. UDS: 08/17/18, low risk CSC: 01/25/18 CSR: Prescribed Tramadol by Dr Louanne Skye on 09/05/18. No other discrepancies identified.

## 2018-09-11 NOTE — Telephone Encounter (Signed)
Last RX: was injectable on 07/13/18, 59mL x 1 refill. PT IS REQUESTING GEL PACKS?. Last OV: 08/17/18 Next OV: due 02/15/19, not yet scheduled. UDS: 08/17/18, low risk CSC: 01/25/18 CSR: Prescribed Tramadol by Dr Louanne Skye on 09/05/18. No other discrepancies identified.

## 2018-09-11 NOTE — Progress Notes (Signed)
Studies have recently confirmed that the shots are not as safe as the daily topical testosterones. If heis willing to switch to topicals which will be pricier I am afraid but safer. If he still wants the shots I will refer him to urology to discuss.

## 2018-09-12 ENCOUNTER — Ambulatory Visit (INDEPENDENT_AMBULATORY_CARE_PROVIDER_SITE_OTHER): Payer: Medicare Other

## 2018-09-12 ENCOUNTER — Other Ambulatory Visit: Payer: Self-pay | Admitting: Family Medicine

## 2018-09-12 DIAGNOSIS — R7989 Other specified abnormal findings of blood chemistry: Secondary | ICD-10-CM | POA: Diagnosis not present

## 2018-09-12 DIAGNOSIS — E349 Endocrine disorder, unspecified: Secondary | ICD-10-CM

## 2018-09-12 DIAGNOSIS — N529 Male erectile dysfunction, unspecified: Secondary | ICD-10-CM

## 2018-09-12 DIAGNOSIS — N400 Enlarged prostate without lower urinary tract symptoms: Secondary | ICD-10-CM

## 2018-09-12 MED ORDER — DIAZEPAM 10 MG PO TABS: 10.0000 mg | ORAL_TABLET | Freq: Every evening | ORAL | 1 refills | Status: DC | PRN

## 2018-09-12 MED ORDER — TESTOSTERONE CYPIONATE 200 MG/ML IM SOLN
200.0000 mg | Freq: Once | INTRAMUSCULAR | Status: AC
  Administered 2018-09-12: 200 mg via INTRAMUSCULAR

## 2018-09-12 NOTE — Progress Notes (Signed)
Nursing note reviewed. Agree with documention and plan. I would like to hav him consider switching to a topical treatment or to get him referred to urology for further consideration moving forward

## 2018-09-12 NOTE — Progress Notes (Signed)
Patient is here per PCP for testosterone injection. Patient supplied medication. 20mL testosterone given in right upper outer quadrant IM. Patient tolerated well.   Pre visit review using our clinic review tool, if applicable. No additional management support is needed unless otherwise documented below in the visit note. Next injection scheduled for 09/26/2018.

## 2018-09-14 NOTE — Progress Notes (Signed)
Called left message for patient to call the office back 

## 2018-09-19 ENCOUNTER — Other Ambulatory Visit: Payer: Self-pay | Admitting: Family Medicine

## 2018-09-19 ENCOUNTER — Encounter: Payer: Self-pay | Admitting: Family Medicine

## 2018-09-19 MED ORDER — TESTOSTERONE 30 MG/ACT TD SOLN: 60.0000 mg | TRANSDERMAL | 5 refills | Status: DC

## 2018-09-25 ENCOUNTER — Other Ambulatory Visit: Payer: Self-pay | Admitting: Family Medicine

## 2018-09-25 ENCOUNTER — Encounter: Payer: Self-pay | Admitting: Family Medicine

## 2018-09-25 MED ORDER — TESTOSTERONE 30 MG/ACT TD SOLN: 60.0000 mg | TRANSDERMAL | 5 refills | Status: DC

## 2018-09-25 NOTE — Telephone Encounter (Signed)
On 09/19/18 there was a message sent stating "I sent the Axiron topical into your pharmacy. We will find out tomorrow if your insurance covers it. Dr B"   Medication was not sent in to pharmacy  Please advise

## 2018-09-26 ENCOUNTER — Ambulatory Visit (INDEPENDENT_AMBULATORY_CARE_PROVIDER_SITE_OTHER): Payer: Medicare Other | Admitting: Family Medicine

## 2018-09-26 ENCOUNTER — Other Ambulatory Visit: Payer: Self-pay | Admitting: Family Medicine

## 2018-09-26 ENCOUNTER — Other Ambulatory Visit: Payer: Self-pay

## 2018-09-26 VITALS — BP 126/74 | HR 59

## 2018-09-26 DIAGNOSIS — E349 Endocrine disorder, unspecified: Secondary | ICD-10-CM | POA: Diagnosis not present

## 2018-09-26 DIAGNOSIS — I10 Essential (primary) hypertension: Secondary | ICD-10-CM

## 2018-09-26 DIAGNOSIS — K219 Gastro-esophageal reflux disease without esophagitis: Secondary | ICD-10-CM

## 2018-09-26 MED ORDER — TESTOSTERONE CYPIONATE 200 MG/ML IM SOLN
200.0000 mg | Freq: Once | INTRAMUSCULAR | Status: AC
  Administered 2018-09-26: 200 mg via INTRAMUSCULAR

## 2018-09-26 NOTE — Progress Notes (Signed)
Patient is here per PCP for testosterone injection. Patient supplied medication. 21mL testosterone given in right upper outer quadrant IM. Patient tolerated well.    Patient is also here for blood pressure check per Dr. [name].  She increased his Lisinopril 5mg  to taking it 2 times a day on 09/19/18.  He has taken medication today.   Has not missed any doses.  Blood pressure #1 146/77 Pulse 85  Blood pressure #2 five minutes later: 126/74  Pulse 59   Per Dr. Lorelei Pont to continue same dose and follow up with Dr. [name] in 4 months.    Patient will call and make an appointment.  I have reviewed the above note by Ms. Marvel Plan and agree with her documentation  Denny Peon MD

## 2018-09-27 ENCOUNTER — Other Ambulatory Visit: Payer: Self-pay | Admitting: Family Medicine

## 2018-09-27 MED ORDER — TESTOSTERONE 30 MG/ACT TD SOLN: 60.0000 mg | TRANSDERMAL | 5 refills | Status: DC

## 2018-09-27 NOTE — Telephone Encounter (Signed)
Copied from [place] [id number]. Topic: Quick Communication - Rx Refill/Question >> Sep 27, 2018 11:37 AM Alanda Slim E wrote: Medication: Testosterone 30 MG/ACT SOLN - medication was sent to [facility] but was very expensive so Pt would like this to be cancelled with [facility]   Has the patient contacted their pharmacy? Yes   Preferred Pharmacy (with phone number or street name[id number] [place], [address] [phone] (Phone) [phone] (Fax)    Agent: Please be advised that RX refills may take up to 3 business days. We ask that you follow-up with your pharmacy.

## 2018-10-03 ENCOUNTER — Encounter (INDEPENDENT_AMBULATORY_CARE_PROVIDER_SITE_OTHER): Payer: Self-pay | Admitting: Specialist

## 2018-10-03 ENCOUNTER — Other Ambulatory Visit (INDEPENDENT_AMBULATORY_CARE_PROVIDER_SITE_OTHER): Payer: Self-pay | Admitting: Radiology

## 2018-10-03 MED ORDER — TRAMADOL HCL 50 MG PO TABS: ORAL_TABLET | ORAL | 0 refills | Status: DC

## 2018-10-03 MED ORDER — TESTOSTERONE 30 MG/ACT TD SOLN: 60.0000 mg | TRANSDERMAL | 5 refills | Status: DC

## 2018-10-03 NOTE — Telephone Encounter (Signed)
He said the plan was to start topical but pharmacy never received it. He wants to stay on injections if topical can't be sent.

## 2018-10-03 NOTE — Telephone Encounter (Signed)
Patient checking status of medication refill

## 2018-10-03 NOTE — Addendum Note (Signed)
Addended byDamita Dunnings D on: 10/03/2018 11:05 AM   Modules accepted: Orders

## 2018-10-03 NOTE — Telephone Encounter (Signed)
Rx was sent on 09/27/2018 to [facility] but too expensive, he is [facility]

## 2018-10-03 NOTE — Telephone Encounter (Signed)
I thought we were switching him to topical please confirm before we prescribe

## 2018-10-08 ENCOUNTER — Encounter: Payer: Self-pay | Admitting: Family Medicine

## 2018-10-09 ENCOUNTER — Encounter: Payer: Self-pay | Admitting: Family Medicine

## 2018-10-09 ENCOUNTER — Other Ambulatory Visit: Payer: Self-pay | Admitting: Family Medicine

## 2018-10-09 MED ORDER — TESTOSTERONE CYPIONATE 200 MG/ML IM SOLN: 200.0000 mg | INTRAMUSCULAR | 1 refills | Status: DC

## 2018-10-11 ENCOUNTER — Encounter: Payer: Self-pay | Admitting: Family Medicine

## 2018-10-12 ENCOUNTER — Other Ambulatory Visit: Payer: Self-pay | Admitting: Family Medicine

## 2018-10-12 ENCOUNTER — Ambulatory Visit (INDEPENDENT_AMBULATORY_CARE_PROVIDER_SITE_OTHER): Payer: Medicare Other

## 2018-10-12 ENCOUNTER — Other Ambulatory Visit: Payer: Self-pay

## 2018-10-12 DIAGNOSIS — E349 Endocrine disorder, unspecified: Secondary | ICD-10-CM | POA: Diagnosis not present

## 2018-10-12 MED ORDER — TESTOSTERONE CYPIONATE 200 MG/ML IM SOLN
200.0000 mg | Freq: Once | INTRAMUSCULAR | Status: AC
  Administered 2018-10-12: 200 mg via INTRAMUSCULAR

## 2018-10-19 ENCOUNTER — Other Ambulatory Visit: Payer: Self-pay | Admitting: Family Medicine

## 2018-10-25 ENCOUNTER — Other Ambulatory Visit: Payer: Self-pay

## 2018-10-25 ENCOUNTER — Ambulatory Visit (INDEPENDENT_AMBULATORY_CARE_PROVIDER_SITE_OTHER): Payer: Medicare Other

## 2018-10-25 DIAGNOSIS — E349 Endocrine disorder, unspecified: Secondary | ICD-10-CM

## 2018-10-25 MED ORDER — TESTOSTERONE CYPIONATE 200 MG/ML IM SOLN
200.0000 mg | Freq: Once | INTRAMUSCULAR | Status: AC
  Administered 2018-10-25: 200 mg via INTRAMUSCULAR

## 2018-10-25 NOTE — Progress Notes (Addendum)
Pre visit review using our clinic tool,if applicable. No additional management support is needed unless otherwise documented below in the visit note.   [name].  Patient tolerated well no complaints voiced this visit.  Return appointment scheduled for 2 weeks.  Luis Domino LPN    Nursing note reviewed. Agree with documention and plan.

## 2018-10-29 ENCOUNTER — Other Ambulatory Visit (INDEPENDENT_AMBULATORY_CARE_PROVIDER_SITE_OTHER): Payer: Self-pay | Admitting: Specialist

## 2018-10-30 NOTE — Telephone Encounter (Signed)
Tramadol refill request 

## 2018-10-31 ENCOUNTER — Encounter: Payer: Self-pay | Admitting: Family Medicine

## 2018-11-02 ENCOUNTER — Other Ambulatory Visit: Payer: Self-pay | Admitting: Family Medicine

## 2018-11-02 ENCOUNTER — Telehealth: Payer: Self-pay | Admitting: Family Medicine

## 2018-11-02 MED ORDER — LISINOPRIL 5 MG PO TABS: 5.0000 mg | ORAL_TABLET | Freq: Two times a day (BID) | ORAL | 1 refills | Status: DC

## 2018-11-02 NOTE — Telephone Encounter (Signed)
Copied from [place] [phone]. Topic: Quick Communication - Rx Refill/Question >> Nov 02, 2018 12:42 PM [place], [place] D wrote: Medication: lisinopril (PRINIVIL,ZESTRIL) 5 MG tablet / Pt stated Dr. [name] increased his dosage from 1 tablet 1 time a day to 1 table 2 times a day and this has caused pt to run out before his refill is due. Pt requesting refill with new dosage. Please advise.  Has the patient contacted their pharmacy? Yes.   (Agent: If no, request that the patient contact the pharmacy for the refill.) (Agent: If yes, when and what did the pharmacy advise?)  Preferred Pharmacy (with phone number or street name): [id number] [place], [place] - [address] [phone] (Phone) [phone] (Fax)    Agent: Please be advised that RX refills may take up to 3 business days. We ask that you follow-up with your pharmacy.

## 2018-11-02 NOTE — Telephone Encounter (Signed)
I sent the new 90 day supply for bid dosing to [facility] before I realized [facility] was [facility]. I now have sent it there also so let patient know only to pu at [facility]. Also I need some blood pressure numbers since he increased so please document a couple

## 2018-11-05 NOTE — Telephone Encounter (Signed)
Spoke with patient he stated he will call back in a few days to give BP numbers

## 2018-11-06 DIAGNOSIS — K219 Gastro-esophageal reflux disease without esophagitis: Secondary | ICD-10-CM | POA: Diagnosis not present

## 2018-11-08 ENCOUNTER — Ambulatory Visit (INDEPENDENT_AMBULATORY_CARE_PROVIDER_SITE_OTHER): Payer: Medicare Other

## 2018-11-08 ENCOUNTER — Other Ambulatory Visit: Payer: Self-pay

## 2018-11-08 DIAGNOSIS — E349 Endocrine disorder, unspecified: Secondary | ICD-10-CM

## 2018-11-08 MED ORDER — TESTOSTERONE CYPIONATE 200 MG/ML IM SOLN
200.0000 mg | Freq: Once | INTRAMUSCULAR | Status: AC
  Administered 2018-11-08: 200 mg via INTRAMUSCULAR

## 2018-11-13 ENCOUNTER — Encounter: Payer: Self-pay | Admitting: Family Medicine

## 2018-11-14 ENCOUNTER — Other Ambulatory Visit: Payer: Self-pay | Admitting: Family Medicine

## 2018-11-14 NOTE — Progress Notes (Signed)
Nursing note reviewed. Agree with documention and plan.  

## 2018-11-22 ENCOUNTER — Ambulatory Visit (INDEPENDENT_AMBULATORY_CARE_PROVIDER_SITE_OTHER): Payer: Medicare Other

## 2018-11-22 ENCOUNTER — Other Ambulatory Visit: Payer: Self-pay

## 2018-11-22 DIAGNOSIS — E349 Endocrine disorder, unspecified: Secondary | ICD-10-CM

## 2018-11-22 MED ORDER — TESTOSTERONE CYPIONATE 200 MG/ML IM SOLN
200.0000 mg | Freq: Once | INTRAMUSCULAR | Status: AC
  Administered 2018-11-22: 200 mg via INTRAMUSCULAR

## 2018-11-22 NOTE — Progress Notes (Addendum)
Pre visit review using our clinic tool,if applicable. No additional management support is needed unless otherwise documented below in the visit note.   Patient in for Testosterone 200 mg injection. Given IM right Ventrogluteal.  Patient tolerated well.  Appointment given to retrun in 2 weeks.  Agree with administration for testosterone deficiency.  Mackie Pai, PA-C

## 2018-11-29 ENCOUNTER — Ambulatory Visit: Payer: Medicare Other | Admitting: Specialist

## 2018-11-29 ENCOUNTER — Ambulatory Visit: Payer: Self-pay

## 2018-11-29 ENCOUNTER — Encounter: Payer: Self-pay | Admitting: Specialist

## 2018-11-29 ENCOUNTER — Other Ambulatory Visit: Payer: Self-pay

## 2018-11-29 VITALS — BP 147/74 | HR 88 | Ht 70.0 in | Wt 205.0 lb

## 2018-11-29 DIAGNOSIS — R0789 Other chest pain: Secondary | ICD-10-CM

## 2018-11-29 DIAGNOSIS — M4325 Fusion of spine, thoracolumbar region: Secondary | ICD-10-CM | POA: Diagnosis not present

## 2018-11-29 DIAGNOSIS — T84498A Other mechanical complication of other internal orthopedic devices, implants and grafts, initial encounter: Secondary | ICD-10-CM | POA: Diagnosis not present

## 2018-11-29 DIAGNOSIS — M5134 Other intervertebral disc degeneration, thoracic region: Secondary | ICD-10-CM | POA: Diagnosis not present

## 2018-11-29 DIAGNOSIS — M542 Cervicalgia: Secondary | ICD-10-CM

## 2018-11-29 MED ORDER — HYDROCODONE-ACETAMINOPHEN 5-325 MG PO TABS: 1.0000 | ORAL_TABLET | Freq: Four times a day (QID) | ORAL | 0 refills | Status: DC | PRN

## 2018-11-29 NOTE — Patient Instructions (Addendum)
Avoid frequent bending and stooping  No lifting greater than 10 lbs. May use ice or moist heat for pain. Weight loss is of benefit. Hydrocodone for pain. Exercise is important to improve your indurance and does allow people to function better inspite of back pain. MRI of the thoracic spine ordered to assess for proximal junctional stenosis or thoracic disc herniation above the long T-L fusion.  Hemp CBD oil capsules  5,000-7,000 mg per bottle, 60 capsules per bottle take one capsule twice a day  [employer]

## 2018-11-29 NOTE — Progress Notes (Addendum)
Office Visit Note   Patient: Luis Brown           Date of Birth: [birth year]           MRN: [id number] Visit Date: 11/29/2018              Requested by: [name], MD [address] [place], [place] [address] PCP: [name], MD   Assessment & Plan: Visit Diagnoses:  1. Fusion of spine of thoracolumbar region   2. Cervicalgia   3. Other chest pain   4. 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 ([place])   5. Degenerative disc disease, thoracic     Plan: Avoid frequent bending and stooping  No lifting greater than 10 lbs. May use ice or moist heat for pain. Weight loss is of benefit. Hydrocodone for pain. Exercise is important to improve your indurance and does allow people to function better inspite of back pain. MRI of the thoracic spine ordered  to assess for proximal junctional stenosis or thoracic disc herniation above the long T-L fusion. Hemp CBD oil capsules  5,000-7,000 mg per bottle, 60 capsules per bottle take one capsule twice a day  [email or web address]  Follow-Up Instructions: Return in about 3 weeks (around 12/20/2018).   Orders:  Orders Placed This Encounter  Procedures  . XR Cervical Spine 2 or 3 views  . XR Lumbar Spine 2-3 Views  . XR Thoracic Spine 2 View   No orders of the defined types were placed in this encounter.     Procedures: No procedures performed   Clinical Data: No additional findings.   Subjective: Chief Complaint  Patient presents with  . Neck - Pain  . Lower Back - Pain    73 year old male with history of low back disease due to degeneration and has had a fusion T10 to S1. Returned to the OR recently for removal of hardware L5-S1 and iliac wing screws and revision of hardware L5-S1 with rearthrodesis 05/2017. He has done well until recently he started having pain at the upper fusion area at the thoracolumbar and flank area. He has noted a left lumbar abdomenal wall protrusion on the left upper lateral anterior abdomenal  wall. He is experiencing pain that is burning quality with associated weakness in the legs and pain at night with lying down. No bowel difficulty, he takes pills for passing his water, sometimes it is a little slow. He needs to see a urologist to assess For prostate swelling.    Review of Systems  Constitutional: Negative.   HENT: Negative.   Eyes: Negative.   Respiratory: Negative.   Cardiovascular: Negative.   Gastrointestinal: Negative.   Endocrine: Negative.   Genitourinary: Negative.   Musculoskeletal: Negative.   Skin: Negative.   Allergic/Immunologic: Negative.   Neurological: Negative.   Hematological: Negative.   Psychiatric/Behavioral: Negative.      Objective: Vital Signs: BP (!) 147/74 (BP Location: Left Arm, Patient Position: Sitting)   Pulse 88   Ht 5\' 10"  (1.778 m)   Wt 205 lb (93 kg)   BMI 29.41 kg/m   Physical Exam Constitutional:      Appearance: He is well-developed.  HENT:     Head: Normocephalic and atraumatic.  Eyes:     Pupils: Pupils are equal, round, and reactive to light.  Neck:     Musculoskeletal: Normal range of motion and neck supple.  Pulmonary:     Effort: Pulmonary effort is normal.  Breath sounds: Normal breath sounds.  Abdominal:     General: Bowel sounds are normal.     Palpations: Abdomen is soft.  Musculoskeletal: Normal range of motion.  Skin:    General: Skin is warm and dry.  Neurological:     Mental Status: He is alert and oriented to person, place, and time.  Psychiatric:        Behavior: Behavior normal.        Thought Content: Thought content normal.        Judgment: Judgment normal.     Back Exam   Tenderness  The patient is experiencing tenderness in the lumbar.  Muscle Strength  Right Quadriceps:  5/5  Left Quadriceps:  5/5  Right Hamstrings:  5/5  Left Hamstrings:  5/5   Reflexes  Patellar: 0/4 Achilles: 0/4 Babinski's sign: normal   Other  Heel walk: normal Sensation: normal Gait: normal   Erythema: no back redness Scars: absent      Specialty Comments:  No specialty comments available.  Imaging: Xr Cervical Spine 2 Or 3 Views  Result Date: 11/29/2018 AP and lateral flexion and extension radiographs with DDD C5-6 and C6-7 there is reversal of normal lordosis through this area.   Xr Lumbar Spine 2-3 Views  Result Date: 11/29/2018 Lumbar spine AP and lateral flexion and extension radiographs show the fusion T10 to S1 with pedicle screws and rods present the construct appears intact with no motion through out the fusion site. The T9-10 level has loss of disc height and there is suggestion of pedicle screws approximating the disc at this level.  Radiographs directed at this level are ordered to give better resolution.     PMFS History: Patient Active Problem List   Diagnosis Date Noted  . 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 Thoracic stenosis 04/17/2017  . Painful orthopaedic hardware ([place]) 04/17/2017  . 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 ([place]) 04/17/2017  . Low back pain radiating to both legs 01/16/2017  . Esophageal reflux 02/10/2015  . Medicare annual wellness visit, subsequent 02/10/2015  . Personal history of colonic polyps 10/27/2012  . 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  . BPH (benign prostatic hyperplasia) 04/23/2012  . ED (erectile dysfunction) 04/23/2012  . Allergic state 10/25/2011  . Anxiety and depression 10/25/2011  . Asthma   . Diabetes mellitus without complication ([place])   . Hyperlipidemia   . Overweight   . Insomnia   . Fatigue   . Preventative health care   . DDD (degenerative disc disease), lumbosacral   . HTN (hypertension)    Past Medical History:  Diagnosis Date  . Acute pharyngitis 10/21/2013  . Allergy    grass and pollen  . Anxiety and depression 10/25/2011  . BPH (benign prostatic hyperplasia) 04/23/2012  . Chicken pox as a child  . DDD  (degenerative disc disease)    cervical responds to steroid injections and low back required surgery  . DDD (degenerative disc disease), lumbosacral   . Diabetes mellitus    pre  . ED (erectile dysfunction) 04/23/2012  . Elevated BP   . Esophageal reflux 02/10/2015  . Fatigue   . HTN (hypertension)   . Hyperglycemia    preDM   . Hyperlipidemia   . Insomnia   . Low back pain radiating to both legs 01/16/2017  . Measles as a child  . Medicare annual wellness visit, subsequent 02/10/2015  . Overweight(278.02)   . Personal history  of colonic polyps 10/27/2012   Follows with [facility]  . Preventative health care   . 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    Family History  Problem Relation Age of Onset  . Hypertension Mother   . Diabetes Mother        type 2  . Cancer Mother 32       breast in remission  . Emphysema Father        smoker  . COPD Father        smoker  . Stroke Father 23       mini  . Heart disease Father   . Hypertension Sister   . Hyperlipidemia Sister   . Scoliosis Sister   . Osteoporosis Sister   . Hypertension Maternal Grandmother   . Scoliosis Maternal Grandmother   . Heart disease Maternal Grandfather   . Heart disease Paternal Grandfather        smoker  . Heart disease Daughter     Past Surgical History:  Procedure Laterality Date  . BACK SURGERY  2012 and 1994   Dr Margret Chance, screws and cage in low back  . TONSILLECTOMY    . torn rotator cuff  2010   right   Social History   Occupational History  . Not on file  Tobacco Use  . Smoking status: Former Smoker    Packs/day: 1.00    Years: 20.00    Pack years: 20.00    Types: Cigarettes    Last attempt to quit: 07/18/1989    Years since quitting: 29.3  . Smokeless tobacco: Never Used  . Tobacco comment: off and on smoking for the 20 yrs  Substance and Sexual Activity  . Alcohol use: Yes    Comment: drink or two a day- mixed drink  . Drug use: No  . Sexual activity: Yes    Comment:  lives with wife, still working, no dietary restrictions, continues to exercise intermittently

## 2018-11-30 ENCOUNTER — Other Ambulatory Visit (INDEPENDENT_AMBULATORY_CARE_PROVIDER_SITE_OTHER): Payer: Self-pay | Admitting: Specialist

## 2018-11-30 NOTE — Telephone Encounter (Signed)
Tramadol refill request 

## 2018-12-04 ENCOUNTER — Encounter: Payer: Self-pay | Admitting: Specialist

## 2018-12-04 ENCOUNTER — Other Ambulatory Visit: Payer: Self-pay | Admitting: Radiology

## 2018-12-05 ENCOUNTER — Other Ambulatory Visit: Payer: Self-pay

## 2018-12-05 ENCOUNTER — Ambulatory Visit (INDEPENDENT_AMBULATORY_CARE_PROVIDER_SITE_OTHER): Payer: Medicare Other | Admitting: Medical

## 2018-12-05 ENCOUNTER — Encounter: Payer: Self-pay | Admitting: Medical

## 2018-12-05 VITALS — BP 145/71 | HR 96 | Ht 70.0 in | Wt 194.0 lb

## 2018-12-05 DIAGNOSIS — R140 Abdominal distension (gaseous): Secondary | ICD-10-CM | POA: Diagnosis not present

## 2018-12-05 DIAGNOSIS — D179 Benign lipomatous neoplasm, unspecified: Secondary | ICD-10-CM

## 2018-12-05 DIAGNOSIS — K5900 Constipation, unspecified: Secondary | ICD-10-CM

## 2018-12-05 MED ORDER — HYDROCODONE-ACETAMINOPHEN 5-325 MG PO TABS: 1.0000 | ORAL_TABLET | Freq: Four times a day (QID) | ORAL | 0 refills | Status: DC | PRN

## 2018-12-05 NOTE — Patient Instructions (Signed)
By recent history and description it does appear that patient has constipation since starting Norco.  Would recommend trying bottle of magnesium citrate tonight.  Make sure that he stays adequately hydrated and get some mild exercise such as walking.  It is good/significant bowel movement about tomorrow then can switch to Dulcolax every second day or so to help with constipation.  On video inspection it is hard to make definitive diagnosis of perceived left flank area lump.  To me on inspection it appears to favor a lipoma.  At the end of office visit today patient for me that he was coming in tomorrow for testosterone injection.  I will be in the office tomorrow and advised that I wanted to quickly auscultate his abdomen and feel/palpate abdomen to get a better idea etiology of left flank bulge.  Also informed patient that when he gets a lumbar spine MRI they might give incidental finding report from that area.  For back pain continue with orthopedist.  Follow-up date to be determined after get update as to whether his constipation has resolved and after exam tomorrow morning.

## 2018-12-05 NOTE — Progress Notes (Signed)
Subjective:    [name], male    DOB: 06/10/1946, 73 y.o.   MRN: [id number]  HPI  Virtual Visit via Video Note  I connected with [name] on 12/05/18 at  3:00 PM EDT by a video enabled telemedicine application and verified that I am speaking with the correct person using two identifiers.  Location: Patient: home Provider: home   I discussed the limitations of evaluation and management by telemedicine and the availability of in person appointments. The patient expressed understanding and agreed to proceed.  History of Present Illness: Pt states recently he feels like has bulge in left flank area just recently. He saw this at orthopedist office. He was at his orthopedist office last week when he was getting dressed. No pain in area.  Pt states some consipation recently. He states taking hydrocodone since last Saturday. Having bm every 2nd or 3rd. Harder stools and more difficulty. No fever,no chills or sweats. No body aches. Abdomen does feel bloated.  Pt states will have mri of his lumbar spine upcoming.(on norco every 6 hours). Mri scheduled on December 25, 2018.    Observations/Objective: General- no acute distress. Pleasant. Oriented. Lungs- appear to breath unlabored. Abdomen- faint pain to left of umbilicus he reports. But no other area of pain. On inspection left flank area appears like bulge with dome shape(by video inspection)  Assessment and Plan: By recent history and description it does appear that patient has constipation since starting Norco.  Would recommend trying bottle of magnesium citrate tonight.  Make sure that he stays adequately hydrated and get some mild exercise such as walking.  It is good/significant bowel movement about tomorrow then can switch to Dulcolax every second day or so to help with constipation.  On video inspection it is hard to make definitive diagnosis of perceived left flank area lump.  To me on inspection it appears to  favor a lipoma.  At the end of office visit today patient for me that he was coming in tomorrow for testosterone injection.  I will be in the office tomorrow and advised that I wanted to quickly auscultate his abdomen and feel/palpate abdomen to get a better idea etiology of left flank bulge.  Also informed patient that when he gets a lumbar spine MRI they might give incidental finding report from that area.  For back pain continue with orthopedist.  Follow-up date to be determined after get update as to whether his constipation has resolved and after exam tomorrow morning.  Follow Up Instructions:    I discussed the assessment and treatment plan with the patient. The patient was provided an opportunity to ask questions and all were answered. The patient agreed with the plan and demonstrated an understanding of the instructions.   The patient was advised to call back or seek an in-person evaluation if the symptoms worsen or if the condition fails to improve as anticipated.  25 minutes spent with patient today.  50% time spent on explaining treatment for constipation as well as differential diagnosis of his left flank area bulge.   Mackie Pai, PA-C   Review of Systems  Constitutional: Negative for chills, fatigue and fever.  HENT: Negative for congestion.   Respiratory: Negative for cough, chest tightness, shortness of breath and wheezing.   Cardiovascular: Negative for chest pain and palpitations.  Gastrointestinal: Positive for abdominal distention and constipation. Negative for abdominal pain, diarrhea, nausea, rectal pain and vomiting.  Genitourinary: Negative for dysuria, flank pain  and frequency.  Musculoskeletal: Positive for back pain.  Skin: Negative for rash.  Neurological: Negative for dizziness and headaches.  Hematological: Negative for adenopathy. Does not bruise/bleed easily.  Psychiatric/Behavioral: Negative for behavioral problems and confusion.   Past Medical  History:  Diagnosis Date  . Acute pharyngitis 10/21/2013  . Allergy    grass and pollen  . Anxiety and depression 10/25/2011  . BPH (benign prostatic hyperplasia) 04/23/2012  . Chicken pox as a child  . DDD (degenerative disc disease)    cervical responds to steroid injections and low back required surgery  . DDD (degenerative disc disease), lumbosacral   . Diabetes mellitus    pre  . ED (erectile dysfunction) 04/23/2012  . Elevated BP   . Esophageal reflux 02/10/2015  . Fatigue   . HTN (hypertension)   . Hyperglycemia    preDM   . Hyperlipidemia   . Insomnia   . Low back pain radiating to both legs 01/16/2017  . Measles as a child  . Medicare annual wellness visit, subsequent 02/10/2015  . Overweight(278.02)   . Personal history of colonic polyps 10/27/2012   Follows with [facility]  . Preventative health care   . 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     Social History   Socioeconomic History  . Marital status: Married    Spouse name: Not on file  . Number of children: Not on file  . Years of education: Not on file  . Highest education level: Not on file  Occupational History  . Not on file  Social Needs  . Financial resource strain: Not on file  . Food insecurity:    Worry: Not on file    Inability: Not on file  . Transportation needs:    Medical: Not on file    Non-medical: Not on file  Tobacco Use  . Smoking status: Former Smoker    Packs/day: 1.00    Years: 20.00    Pack years: 20.00    Types: Cigarettes    Last attempt to quit: 07/18/1989    Years since quitting: 29.4  . Smokeless tobacco: Never Used  . Tobacco comment: off and on smoking for the 20 yrs  Substance and Sexual Activity  . Alcohol use: Yes    Comment: drink or two a day- mixed drink  . Drug use: No  . Sexual activity: Yes    Comment: lives with wife, still working, no dietary restrictions, continues to exercise intermittently  Lifestyle  . Physical activity:    Days per week: Not on  file    Minutes per session: Not on file  . Stress: Not on file  Relationships  . Social connections:    Talks on phone: Not on file    Gets together: Not on file    Attends religious service: Not on file    Active member of club or organization: Not on file    Attends meetings of clubs or organizations: Not on file    Relationship status: Not on file  . Intimate partner violence:    Fear of current or ex partner: Not on file    Emotionally abused: Not on file    Physically abused: Not on file    Forced sexual activity: Not on file  Other Topics Concern  . Not on file  Social History Narrative  . Not on file    Past Surgical History:  Procedure Laterality Date  . BACK SURGERY  2012 and 1994   Dr Margret Chance, screws and  cage in low back  . TONSILLECTOMY    . torn rotator cuff  2010   right    Family History  Problem Relation Age of Onset  . Hypertension Mother   . Diabetes Mother        type 2  . Cancer Mother 23       breast in remission  . Emphysema Father        smoker  . COPD Father        smoker  . Stroke Father 35       mini  . Heart disease Father   . Hypertension Sister   . Hyperlipidemia Sister   . Scoliosis Sister   . Osteoporosis Sister   . Hypertension Maternal Grandmother   . Scoliosis Maternal Grandmother   . Heart disease Maternal Grandfather   . Heart disease Paternal Grandfather        smoker  . Heart disease Daughter     No Known Allergies  Current Outpatient Medications on File Prior to Visit  Medication Sig Dispense Refill  . acyclovir (ZOVIRAX) 200 MG capsule TAKE ONE CAPSULE BY MOUTH 5 TIMES A DAY 25 capsule 1  . aspirin 81 MG tablet Take 81 mg by mouth daily.    Marland Kitchen atorvastatin (LIPITOR) 20 MG tablet TAKE 1/2 TABLET BY MOUTH DAILY AT 6 PM 45 tablet 0  . b complex vitamins tablet Take 1 tablet by mouth daily.    . celecoxib (CELEBREX) 200 MG capsule TAKE ONE CAPSULE BY MOUTH ONE TIME DAILY  90 capsule 0  . cyclobenzaprine (FLEXERIL) 5  MG tablet Take 1 tablet (5 mg total) by mouth at bedtime. 4 tablet 0  . diazepam (VALIUM) 10 MG tablet Take 1 tablet (10 mg total) by mouth at bedtime as needed. 30 tablet 1  . famotidine (PEPCID) 40 MG tablet Take 1 tablet (40 mg total) by mouth at bedtime. 90 tablet 1  . glucose blood (ONE TOUCH ULTRA TEST) test strip Check daily 32 each 1  . HYDROcodone-acetaminophen (NORCO/VICODIN) 5-325 MG tablet Take 1 tablet by mouth every 6 (six) hours as needed for moderate pain. 30 tablet 0  . lisinopril (ZESTRIL) 5 MG tablet Take 1 tablet (5 mg total) by mouth 2 (two) times daily. 180 tablet 1  . metFORMIN (GLUCOPHAGE) 500 MG tablet TAKE ONE TABLET BY MOUTH ONE TIME DAILY  90 tablet 0  . methocarbamol (ROBAXIN) 500 MG tablet Take 1 tablet (500 mg total) by mouth every 8 (eight) hours as needed. 60 tablet 1  . PROAIR HFA 108 (90 Base) MCG/ACT inhaler TAKE 1-2 PUFFS INHALE THREE TIMES A DAY (Patient taking differently: TAKE 1-2 PUFFS INHALE THREE TIMES A DAY AS NEEDED FOR SHORTNESS OF BREATH) 8.5 Inhaler 0  . sildenafil (REVATIO) 20 MG tablet TAKE ONE TO THREE TABLETS BY MOUTH DAILY AS NEEDED 50 tablet 2  . tamsulosin (FLOMAX) 0.4 MG CAPS capsule TAKE TWO CAPSULES BY MOUTH DAILY  180 capsule 0  . Testosterone 30 MG/ACT SOLN Place 60 mg onto the skin every morning. 90 mL 5  . testosterone cypionate (DEPOTESTOSTERONE CYPIONATE) 200 MG/ML injection Inject 1 mL (200 mg total) into the muscle every 14 (fourteen) days. 10 mL 1  . traMADol (ULTRAM) 50 MG tablet TAKE 2 TABLETS BY MOUTH EVERY 6 HOURS AS NEEDED FOR MODERATE PAIN FOR UP TO 7 DAYS 30 tablet 0  . venlafaxine XR (EFFEXOR-XR) 150 MG 24 hr capsule TAKE ONE CAPSULE BY MOUTH ONE TIME DAILY  with  breakfast 90 capsule 0  . cetirizine (ZYRTEC) 10 MG tablet Take 1 tablet (10 mg total) by mouth daily as needed for allergies or rhinitis. 30 tablet 11  . fluticasone (FLONASE) 50 MCG/ACT nasal spray Place 2 sprays into the nose daily. 48 g 1   No current  facility-administered medications on file prior to visit.     BP (!) 145/71 Comment: He states ran to get his machine before checking.  Pulse 96   Ht 5\' 10"  (1.778 m)   Wt 194 lb (88 kg)   BMI 27.84 kg/m       Objective:   Physical Exam        Assessment & Plan:

## 2018-12-06 ENCOUNTER — Other Ambulatory Visit: Payer: Self-pay

## 2018-12-06 ENCOUNTER — Ambulatory Visit (INDEPENDENT_AMBULATORY_CARE_PROVIDER_SITE_OTHER): Payer: Medicare Other | Admitting: *Deleted

## 2018-12-06 ENCOUNTER — Other Ambulatory Visit: Payer: Self-pay | Admitting: Radiology

## 2018-12-06 VITALS — BP 127/73 | HR 73

## 2018-12-06 DIAGNOSIS — E349 Endocrine disorder, unspecified: Secondary | ICD-10-CM

## 2018-12-06 MED ORDER — TESTOSTERONE CYPIONATE 200 MG/ML IM SOLN
200.0000 mg | Freq: Once | INTRAMUSCULAR | Status: AC
  Administered 2018-12-06: 200 mg via INTRAMUSCULAR

## 2018-12-06 NOTE — Progress Notes (Addendum)
Patient in for Testosterone 200 mg injection.  Injection given and patient tolerated well.  Appointment given to retrun in 2 weeks.  Agree with administration of testosterone.  Mackie Pai, PA-C

## 2018-12-07 ENCOUNTER — Encounter: Payer: Self-pay | Admitting: Specialist

## 2018-12-07 MED ORDER — CELECOXIB 200 MG PO CAPS: 200.0000 mg | ORAL_CAPSULE | Freq: Every day | ORAL | 0 refills | Status: DC

## 2018-12-07 MED ORDER — HYDROCODONE-ACETAMINOPHEN 5-325 MG PO TABS: 1.0000 | ORAL_TABLET | Freq: Four times a day (QID) | ORAL | 0 refills | Status: DC | PRN

## 2018-12-10 ENCOUNTER — Encounter: Payer: Self-pay | Admitting: Specialist

## 2018-12-11 ENCOUNTER — Other Ambulatory Visit: Payer: Self-pay | Admitting: Radiology

## 2018-12-11 NOTE — Telephone Encounter (Signed)
Patient wanted a refill on the gabapentin, he had some from before surgery and he has been taking it and says it helps.

## 2018-12-12 ENCOUNTER — Ambulatory Visit
Admission: RE | Admit: 2018-12-12 | Discharge: 2018-12-12 | Disposition: A | Discharge status: Home / Self Care | Payer: Medicare Other | Source: Ambulatory Visit | Attending: Specialist | Admitting: Specialist

## 2018-12-12 ENCOUNTER — Other Ambulatory Visit: Payer: Self-pay

## 2018-12-12 DIAGNOSIS — M546 Pain in thoracic spine: Secondary | ICD-10-CM | POA: Diagnosis not present

## 2018-12-12 DIAGNOSIS — R0789 Other chest pain: Secondary | ICD-10-CM

## 2018-12-12 MED ORDER — GABAPENTIN 300 MG PO CAPS: 300.0000 mg | ORAL_CAPSULE | Freq: Three times a day (TID) | ORAL | 0 refills | Status: DC

## 2018-12-13 ENCOUNTER — Encounter: Payer: Self-pay | Admitting: Specialist

## 2018-12-13 ENCOUNTER — Telehealth: Payer: Self-pay | Admitting: *Deleted

## 2018-12-13 ENCOUNTER — Other Ambulatory Visit: Payer: Self-pay | Admitting: Specialist

## 2018-12-13 NOTE — Telephone Encounter (Signed)
Lori with [employer] called wanting to get clarification on pt pain medication. Please call at [phone]

## 2018-12-13 NOTE — Telephone Encounter (Signed)
NORCO REFILL REQUEST

## 2018-12-13 NOTE — Telephone Encounter (Signed)
Nitka patient 

## 2018-12-14 MED ORDER — HYDROCODONE-ACETAMINOPHEN 5-325 MG PO TABS: 1.0000 | ORAL_TABLET | Freq: Four times a day (QID) | ORAL | 0 refills | Status: DC | PRN

## 2018-12-15 ENCOUNTER — Other Ambulatory Visit: Payer: Self-pay | Admitting: Family Medicine

## 2018-12-16 ENCOUNTER — Other Ambulatory Visit: Payer: Self-pay | Admitting: Family Medicine

## 2018-12-17 ENCOUNTER — Ambulatory Visit: Payer: Medicare Other | Admitting: Specialist

## 2018-12-17 ENCOUNTER — Encounter: Payer: Self-pay | Admitting: Specialist

## 2018-12-18 ENCOUNTER — Other Ambulatory Visit: Payer: Self-pay

## 2018-12-18 ENCOUNTER — Encounter: Payer: Self-pay | Admitting: Radiology

## 2018-12-18 ENCOUNTER — Ambulatory Visit (INDEPENDENT_AMBULATORY_CARE_PROVIDER_SITE_OTHER): Payer: Medicare Other

## 2018-12-18 ENCOUNTER — Other Ambulatory Visit: Payer: Self-pay | Admitting: Radiology

## 2018-12-18 ENCOUNTER — Encounter: Payer: Self-pay | Admitting: Specialist

## 2018-12-18 DIAGNOSIS — E291 Testicular hypofunction: Secondary | ICD-10-CM

## 2018-12-18 MED ORDER — HYDROCODONE-ACETAMINOPHEN 5-325 MG PO TABS: 1.0000 | ORAL_TABLET | Freq: Four times a day (QID) | ORAL | 0 refills | Status: DC | PRN

## 2018-12-18 NOTE — Progress Notes (Addendum)
Patient here for testosterone cypionate 200 mg im injection.  He gets this every 2 weeks.  Patient provided single vial of testosterone cypionate 200mg /ml (lot #[id number] ex 11/2019), medication was administered IM on right glut w/o any complications.  Patient was scheduled to come back in 2 weeks for next injeciton.  Nursing note reviewed. Agree with documention and plan.

## 2018-12-18 NOTE — Patient Instructions (Signed)
Return in 2 weeks for your next injection. Appointment scheduled for 01-01-19.

## 2018-12-24 ENCOUNTER — Other Ambulatory Visit (INDEPENDENT_AMBULATORY_CARE_PROVIDER_SITE_OTHER): Payer: Self-pay | Admitting: Specialist

## 2018-12-25 ENCOUNTER — Other Ambulatory Visit: Payer: Medicare Other

## 2018-12-27 ENCOUNTER — Other Ambulatory Visit: Payer: Self-pay | Admitting: Family Medicine

## 2018-12-28 ENCOUNTER — Encounter: Payer: Self-pay | Admitting: Specialist

## 2018-12-28 ENCOUNTER — Ambulatory Visit
Admission: RE | Admit: 2018-12-28 | Discharge: 2018-12-28 | Disposition: A | Discharge status: Home / Self Care | Payer: Medicare Other | Source: Ambulatory Visit | Attending: Specialist | Admitting: Specialist

## 2018-12-28 ENCOUNTER — Ambulatory Visit: Payer: Self-pay

## 2018-12-28 ENCOUNTER — Other Ambulatory Visit: Payer: Self-pay

## 2018-12-28 ENCOUNTER — Ambulatory Visit: Payer: Medicare Other | Admitting: Specialist

## 2018-12-28 ENCOUNTER — Ambulatory Visit (INDEPENDENT_AMBULATORY_CARE_PROVIDER_SITE_OTHER): Payer: Medicare Other | Admitting: Specialist

## 2018-12-28 VITALS — BP 148/76 | HR 82 | Ht 70.0 in | Wt 194.0 lb

## 2018-12-28 DIAGNOSIS — M4325 Fusion of spine, thoracolumbar region: Secondary | ICD-10-CM

## 2018-12-28 DIAGNOSIS — T84498A Other mechanical complication of other internal orthopedic devices, implants and grafts, initial encounter: Secondary | ICD-10-CM

## 2018-12-28 DIAGNOSIS — M40294 Other kyphosis, thoracic region: Secondary | ICD-10-CM

## 2018-12-28 DIAGNOSIS — M4804 Spinal stenosis, thoracic region: Secondary | ICD-10-CM

## 2018-12-28 DIAGNOSIS — Z981 Arthrodesis status: Secondary | ICD-10-CM | POA: Diagnosis not present

## 2018-12-28 NOTE — Progress Notes (Signed)
Office Visit Note   Patient: Luis Brown           Date of Birth: 03-Oct-1945           MRN: [id number] Visit Date: 12/28/2018              Requested by: [name], MD [address] [place],  [place] [address] PCP: [name], MD   Assessment & Plan: Visit Diagnoses:  1. Fusion of spine of thoracolumbar region   2. 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 Thoracic spinal stenosis     Plan: Avoid beng, stooping and avoid lifting weights greater than 10 lbs. Avoid prolong standing and walking. Order for a new walker with wheels. Surgery scheduling secretary Kandice Hams, will call you in the next week to schedule for surgery.  Surgery recommended is a five level lumbar fusion T10-11 through T5-6 and  this would be done with rods, screws and hooks with local bone graft and allograft (donor bone graft). Take hydrocodone for for pain. Risk of surgery includes risk of infection 1 in 200 patients, bleeding 1/2% chance you would need a transfusion.   Risk to the nerves is one in 10,000. You will need to use a brace for 3 months and wean from the brace on the 4th month. Expect improved walking and standing tolerance. Expect relief of leg pain but numbness may persist depending on the length and degree of pressure that has been present.  Follow-Up Instructions: Return in about 6 weeks (around 02/08/2019).   Orders:  No orders of the defined types were placed in this encounter.  No orders of the defined types were placed in this encounter.     Procedures: No procedures performed   Clinical Data: No additional findings.   Subjective: Chief Complaint  Patient presents with  . Middle Back - Follow-up    73 year old male with history of T10-S1 fusion with recent rearthrodesis at L5-S1 and removal of iliac wing screws. He is experiencing bilateral leg weakness and feelings like the legs tire. Results of recent MRI show the T10-11 area and T9-10 have  degeneration with foramenal stenosis at T10-11 and loosening of hardware. Now bowel or bladder difficulty. Requiring vicodin every 6 hours and gabapentin 300 mg up to tid. He has been doing some gardening and  Planting.    Review of Systems  Constitutional: Negative.   HENT: Negative.   Eyes: Negative.   Respiratory: Negative.   Cardiovascular: Negative.   Gastrointestinal: Negative.   Endocrine: Negative.   Genitourinary: Negative.   Musculoskeletal: Negative.   Skin: Negative.   Allergic/Immunologic: Negative.   Neurological: Negative.   Hematological: Negative.   Psychiatric/Behavioral: Negative.      Objective: Vital Signs: BP (!) 148/76 (BP Location: Left Arm, Patient Position: Sitting)   Pulse 82   Ht 5\' 10"  (1.778 m)   Wt 194 lb (88 kg)   BMI 27.84 kg/m   Physical Exam Constitutional:      Appearance: He is well-developed.  HENT:     Head: Normocephalic and atraumatic.  Eyes:     Pupils: Pupils are equal, round, and reactive to light.  Neck:     Musculoskeletal: Normal range of motion and neck supple.  Pulmonary:     Effort: Pulmonary effort is normal.     Breath sounds: Normal breath sounds.  Abdominal:     General: Bowel sounds are normal.  Palpations: Abdomen is soft.  Skin:    General: Skin is warm and dry.  Neurological:     Mental Status: He is alert and oriented to person, place, and time.  Psychiatric:        Behavior: Behavior normal.        Thought Content: Thought content normal.        Judgment: Judgment normal.     Back Exam   Tenderness  The patient is experiencing tenderness in the lumbar.  Range of Motion  Extension: abnormal  Flexion: normal  Lateral bend right: normal  Lateral bend left: normal  Rotation right: normal  Rotation left: normal   Muscle Strength  Right Quadriceps:  5/5  Left Quadriceps:  5/5  Right Hamstrings:  5/5  Left Hamstrings:  5/5   Tests  Straight leg raise right: negative Straight leg raise  left: negative  Reflexes  Patellar: 0/4 Achilles: 0/4 Babinski's sign: normal   Other  Toe walk: normal Heel walk: normal Sensation: normal Gait: abnormal  Erythema: no back redness Scars: absent  Comments:  Stooped over the upper back.      Specialty Comments:  No specialty comments available.  Imaging: No results found.   PMFS History: Patient Active Problem List   Diagnosis Date Noted  . 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 Thoracic stenosis 04/17/2017  . Painful orthopaedic hardware ([place]) 04/17/2017  . 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 Low back pain radiating to both legs 01/16/2017  . Esophageal reflux 02/10/2015  . Medicare annual wellness visit, subsequent 02/10/2015  . Personal history of colonic polyps 10/27/2012  . 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  . BPH (benign prostatic hyperplasia) 04/23/2012  . ED (erectile dysfunction) 04/23/2012  . Allergic state 10/25/2011  . Anxiety and depression 10/25/2011  . Asthma   . Diabetes mellitus without complication ([place])   . Hyperlipidemia   . Overweight   . Insomnia   . Fatigue   . Preventative health care   . DDD (degenerative disc disease), lumbosacral   . HTN (hypertension)    Past Medical History:  Diagnosis Date  . Acute pharyngitis 10/21/2013  . Allergy    grass and pollen  . Anxiety and depression 10/25/2011  . BPH (benign prostatic hyperplasia) 04/23/2012  . Chicken pox as a child  . DDD (degenerative disc disease)    cervical responds to steroid injections and low back required surgery  . DDD (degenerative disc disease), lumbosacral   . Diabetes mellitus    pre  . ED (erectile dysfunction) 04/23/2012  . Elevated BP   . Esophageal reflux 02/10/2015  . Fatigue   . HTN (hypertension)   . Hyperglycemia    preDM   . Hyperlipidemia   . Insomnia   . Low back pain radiating to both legs  01/16/2017  . Measles as a child  . Medicare annual wellness visit, subsequent 02/10/2015  . Overweight(278.02)   . Personal history of colonic polyps 10/27/2012   Follows with [facility]  . Preventative health care   . 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    Family History  Problem Relation Age of Onset  . Hypertension Mother   . Diabetes Mother        type 2  . Cancer Mother 68       breast in remission  . Emphysema Father        smoker  . COPD Father  smoker  . Stroke Father 79       mini  . Heart disease Father   . Hypertension Sister   . Hyperlipidemia Sister   . Scoliosis Sister   . Osteoporosis Sister   . Hypertension Maternal Grandmother   . Scoliosis Maternal Grandmother   . Heart disease Maternal Grandfather   . Heart disease Paternal Grandfather        smoker  . Heart disease Daughter     Past Surgical History:  Procedure Laterality Date  . BACK SURGERY  2012 and 1994   Dr Margret Chance, screws and cage in low back  . TONSILLECTOMY    . torn rotator cuff  2010   right   Social History   Occupational History  . Not on file  Tobacco Use  . Smoking status: Former Smoker    Packs/day: 1.00    Years: 20.00    Pack years: 20.00    Types: Cigarettes    Quit date: 07/18/1989    Years since quitting: 29.4  . Smokeless tobacco: Never Used  . Tobacco comment: off and on smoking for the 20 yrs  Substance and Sexual Activity  . Alcohol use: Yes    Comment: drink or two a day- mixed drink  . Drug use: No  . Sexual activity: Yes    Comment: lives with wife, still working, no dietary restrictions, continues to exercise intermittently

## 2018-12-28 NOTE — Addendum Note (Signed)
Addended by: Minda Ditto, Geoffery Spruce on: 12/28/2018 09:47 AM   Modules accepted: Orders

## 2018-12-28 NOTE — Patient Instructions (Signed)
Avoid beng, stooping and avoid lifting weights greater than 10 lbs. Avoid prolong standing and walking. Order for a new walker with wheels. Surgery scheduling secretary Kandice Hams, will call you in the next week to schedule for surgery.  Surgery recommended is a five level lumbar fusion T10-11 through T5-6 and  this would be done with rods, screws and hooks with local bone graft and allograft (donor bone graft). Take hydrocodone for for pain. Risk of surgery includes risk of infection 1 in 200 patients, bleeding 1/2% chance you would need a transfusion.   Risk to the nerves is one in 10,000. You will need to use a brace for 3 months and wean from the brace on the 4th month. Expect improved walking and standing tolerance. Expect relief of leg pain but numbness may persist depending on the length and degree of pressure that has been present.

## 2019-01-01 ENCOUNTER — Ambulatory Visit: Payer: Medicare Other

## 2019-01-01 ENCOUNTER — Other Ambulatory Visit: Payer: Self-pay | Admitting: Radiology

## 2019-01-01 ENCOUNTER — Encounter: Payer: Self-pay | Admitting: Specialist

## 2019-01-01 MED ORDER — HYDROCODONE-ACETAMINOPHEN 5-325 MG PO TABS: 1.0000 | ORAL_TABLET | Freq: Four times a day (QID) | ORAL | 0 refills | Status: DC | PRN

## 2019-01-01 NOTE — Telephone Encounter (Signed)
I called and advised pt that the rx is at the front desk for pick up

## 2019-01-03 ENCOUNTER — Encounter: Payer: Self-pay | Admitting: Family Medicine

## 2019-01-04 ENCOUNTER — Other Ambulatory Visit: Payer: Self-pay | Admitting: Family Medicine

## 2019-01-04 ENCOUNTER — Other Ambulatory Visit: Payer: Self-pay | Admitting: Specialist

## 2019-01-04 ENCOUNTER — Ambulatory Visit: Payer: Medicare Other

## 2019-01-04 MED ORDER — TESTOSTERONE CYPIONATE 200 MG/ML IM SOLN: 200.0000 mg | INTRAMUSCULAR | 1 refills | Status: DC

## 2019-01-07 ENCOUNTER — Encounter: Payer: Self-pay | Admitting: Specialist

## 2019-01-07 NOTE — Telephone Encounter (Signed)
Gabapentin refill request----90 day supply

## 2019-01-08 ENCOUNTER — Encounter: Payer: Self-pay | Admitting: Specialist

## 2019-01-08 ENCOUNTER — Other Ambulatory Visit: Payer: Self-pay

## 2019-01-09 ENCOUNTER — Ambulatory Visit (INDEPENDENT_AMBULATORY_CARE_PROVIDER_SITE_OTHER): Payer: Medicare Other

## 2019-01-09 ENCOUNTER — Other Ambulatory Visit: Payer: Self-pay

## 2019-01-09 DIAGNOSIS — R7989 Other specified abnormal findings of blood chemistry: Secondary | ICD-10-CM | POA: Diagnosis not present

## 2019-01-09 MED ORDER — TESTOSTERONE CYPIONATE 200 MG/ML IM SOLN
200.0000 mg | INTRAMUSCULAR | Status: DC
  Administered 2019-01-09: 200 mg via INTRAMUSCULAR

## 2019-01-09 NOTE — Progress Notes (Addendum)
Pre visit review using our clinic review tool, if applicable. No additional management support is needed unless otherwise documented below in the visit note.  Patient here today for testosterone injection. He receives these injections every 2 weeks. 64mL testosterone given in patients right upper outter quadrant IM. Patient tolerated well. Next appointment scheduled for July 8th.   Ann Held, DO

## 2019-01-09 NOTE — Telephone Encounter (Signed)
Request for refill for Luis Brown.

## 2019-01-09 NOTE — Telephone Encounter (Signed)
norco refill request 

## 2019-01-10 ENCOUNTER — Other Ambulatory Visit (INDEPENDENT_AMBULATORY_CARE_PROVIDER_SITE_OTHER): Payer: Self-pay | Admitting: Specialist

## 2019-01-10 MED ORDER — HYDROCODONE-ACETAMINOPHEN 5-325 MG PO TABS: 1.0000 | ORAL_TABLET | Freq: Four times a day (QID) | ORAL | 0 refills | Status: DC | PRN

## 2019-01-11 ENCOUNTER — Encounter: Payer: Self-pay | Admitting: Specialist

## 2019-01-11 ENCOUNTER — Other Ambulatory Visit: Payer: Self-pay | Admitting: Specialist

## 2019-01-11 ENCOUNTER — Other Ambulatory Visit: Payer: Self-pay | Admitting: Radiology

## 2019-01-11 ENCOUNTER — Other Ambulatory Visit: Payer: Self-pay | Admitting: Family Medicine

## 2019-01-11 MED ORDER — GABAPENTIN 300 MG PO CAPS: 300.0000 mg | ORAL_CAPSULE | Freq: Three times a day (TID) | ORAL | 0 refills | Status: DC

## 2019-01-20 ENCOUNTER — Other Ambulatory Visit: Payer: Self-pay | Admitting: Family Medicine

## 2019-01-21 ENCOUNTER — Ambulatory Visit: Payer: Self-pay

## 2019-01-21 NOTE — Telephone Encounter (Signed)
Pt called stating he is experiencing pain and swelling in his left leg, ankle,and foot. Pt states he has pain in his leg before, but never the swelling. Pt states he has been experiencing these symptoms for 3-4 weeks. Please advise.  Pt stated that the edema extends up the to upper leg to just below the left knee. Denies redness, and feels like a bruise. Pt stated he has seen Dr [name] many years ago but cannot remember what happened that time. Denied SOB or chest pain.  Care advice given and pt verbalized understanding. Call warm transferred to Rod Holler at [facility].  Reason for Disposition . [1] MODERATE leg swelling (e.g., swelling extends up to knees) AND [2] new onset or worsening  Answer Assessment - Initial Assessment Questions 1. ONSET: "When did the swelling start?" (e.g., minutes, hours, days)     3-4 weeks ago 2. LOCATION: "What part of the leg is swollen?"  "Are both legs swollen or just one leg?"     Ankle and foot left leg only 3. SEVERITY: "How bad is the swelling?" (e.g., localized; mild, moderate, severe)  - Localized - small area of swelling localized to one leg  - MILD pedal edema - swelling limited to foot and ankle, pitting edema < 1/4 inch (6 mm) deep, rest and elevation eliminate most or all swelling  - MODERATE edema - swelling of lower leg to knee, pitting edema > 1/4 inch (6 mm) deep, rest and elevation only partially reduce swelling  - SEVERE edema - swelling extends above knee, facial or hand swelling present      Moderate just below knee 4. REDNESS: "Does the swelling look red or infected?"     no 5. PAIN: "Is the swelling painful to touch?" If so, ask: "How painful is it?"   (Scale 1-10; mild, moderate or severe)    No feels like he bruised 6. FEVER: "Do you have a fever?" If so, ask: "What is it, how was it measured, and when did it start?"      no 7. CAUSE: "What do you think is causing the leg swelling?"     Pt doesn't know maybe related to nerve damage form back and  instability of foot 8. MEDICAL HISTORY: "Do you have a history of heart failure, kidney disease, liver failure, or cancer?"     no 9. RECURRENT SYMPTOM: "Have you had leg swelling before?" If so, ask: "When was the last time?" "What happened that time?"     Yes - years ago - pt cannot remember what happene 10. OTHER SYMPTOMS: "Do you have any other symptoms?" (e.g., chest pain, difficulty breathing)       no 11. PREGNANCY: "Is there any chance you are pregnant?" "When was your last menstrual period?"       n/a  Protocols used: LEG SWELLING AND EDEMA-A-AH

## 2019-01-22 ENCOUNTER — Ambulatory Visit (HOSPITAL_BASED_OUTPATIENT_CLINIC_OR_DEPARTMENT_OTHER)
Admission: RE | Admit: 2019-01-22 | Discharge: 2019-01-22 | Disposition: A | Discharge status: Home / Self Care | Payer: Medicare Other | Source: Ambulatory Visit | Attending: Medical | Admitting: Medical

## 2019-01-22 ENCOUNTER — Other Ambulatory Visit: Payer: Self-pay

## 2019-01-22 ENCOUNTER — Encounter: Payer: Self-pay | Admitting: Medical

## 2019-01-22 ENCOUNTER — Telehealth: Payer: Self-pay | Admitting: Family Medicine

## 2019-01-22 ENCOUNTER — Ambulatory Visit (INDEPENDENT_AMBULATORY_CARE_PROVIDER_SITE_OTHER): Payer: Medicare Other | Admitting: Medical

## 2019-01-22 VITALS — BP 138/62 | HR 71 | Temp 98.4°F | Resp 16 | Ht 70.0 in | Wt 202.4 lb

## 2019-01-22 DIAGNOSIS — G8929 Other chronic pain: Secondary | ICD-10-CM

## 2019-01-22 DIAGNOSIS — M1712 Unilateral primary osteoarthritis, left knee: Secondary | ICD-10-CM | POA: Diagnosis not present

## 2019-01-22 DIAGNOSIS — D179 Benign lipomatous neoplasm, unspecified: Secondary | ICD-10-CM

## 2019-01-22 DIAGNOSIS — E349 Endocrine disorder, unspecified: Secondary | ICD-10-CM | POA: Diagnosis not present

## 2019-01-22 DIAGNOSIS — R7989 Other specified abnormal findings of blood chemistry: Secondary | ICD-10-CM | POA: Diagnosis not present

## 2019-01-22 DIAGNOSIS — Z125 Encounter for screening for malignant neoplasm of prostate: Secondary | ICD-10-CM | POA: Diagnosis not present

## 2019-01-22 DIAGNOSIS — M25562 Pain in left knee: Secondary | ICD-10-CM

## 2019-01-22 DIAGNOSIS — M7989 Other specified soft tissue disorders: Secondary | ICD-10-CM

## 2019-01-22 LAB — D-DIMER, QUANTITATIVE: D-Dimer, Quant: 1.64 mcg/mL FEU — ABNORMAL HIGH (ref <0.50)

## 2019-01-22 LAB — PSA: PSA: 0.35 ng/mL (ref 0.10–4.00)

## 2019-01-22 MED ORDER — TESTOSTERONE CYPIONATE 200 MG/ML IM SOLN
200.0000 mg | Freq: Once | INTRAMUSCULAR | Status: DC
  Administered 2019-01-22: 200 mg via INTRAMUSCULAR

## 2019-01-22 NOTE — Patient Instructions (Addendum)
For your chronic left knee pain over the years with recent mild increase in symptoms, will get left knee x-ray today and recommend that she continue current pain medication regimen for your back.  If your knee joint space is significantly decreased then might need to refer you to orthopedist.  For your recent left calf swelling and left ankle swelling which is currently resolved/not present, I think the best way to approach this would be to get d-dimer.  With low clinical suspicion for DVT I think we can rule negative d-dimer.  However if your calf re-swells or you have any popliteal pain then would get ultrasound of lower extremity despite negative test.  Also if your D-dimer test happens to come back elevated will need to order lower extremity ultrasound.  For history of low testosterone was given testosterone injection today.  Spent about 1 year since her last PSA so is a good time to get PSA screening as well.  Refer for lipoma evaluation.   Follow-up date to be determined depending on study results and how you do clinically   I did get the patient's d-dimer back and it was elevated.  So I did call patient and talk with him directly informing him of the result.  Informed him that I did get him scheduled for left lower extremity ultrasound today at 530.  Patient oxygen today is 99%.  Patient does report some chronic baseline intermittent mild shortness of breath but not significantly different than baseline presently.  I did explain to him that if over the next few days if he has any significant shortness of breath let me know in that event would either advised outpatient CT or advised him to go to the emergency department.  Patient expressed understanding.

## 2019-01-22 NOTE — Telephone Encounter (Signed)
Ok with me 

## 2019-01-22 NOTE — Telephone Encounter (Signed)
LVM informing patient that it has been approved to transfer care. Awaiting return call to schedule TOC appointment.

## 2019-01-22 NOTE — Progress Notes (Signed)
Subjective:    [name], male    DOB: June 29, 1946, 73 y.o.   MRN: [id number]  HPI  Pt in for some left knee discomfort and knee pain. He points to lateral meniscus He had on and off pain for years in knee. He periodically use knee brace and that helps.   He states he had swelling of left ankle up to his mid calf area.  Pt also states 30 years of mild numbness to his left ankle toward knee.  Pt has upcoming back surgery but he is waiting for a day. He expresses that his gait is effected by back pain.  Pt on hydrocodone, gabapentin and celebrex for pain.  Pt mentions for years small left side chest lump/asymetry. He states over years the area seems to be growing.  Pt has low t and he is getting testosterone injection today.    Review of Systems  Constitutional: Negative for chills, fatigue and fever.  Respiratory: Negative for cough, chest tightness, shortness of breath and wheezing.   Cardiovascular: Negative for chest pain and palpitations.  Musculoskeletal: Negative for back pain.       Possible lipoma left costochondral junction.  Skin: Negative for rash.  Neurological: Negative for dizziness, light-headedness and numbness.  Hematological: Negative for adenopathy. Does not bruise/bleed easily.  Psychiatric/Behavioral: Negative for behavioral problems and confusion.    Past Medical History:  Diagnosis Date  . Acute pharyngitis 10/21/2013  . Allergy    grass and pollen  . Anxiety and depression 10/25/2011  . BPH (benign prostatic hyperplasia) 04/23/2012  . Chicken pox as a child  . DDD (degenerative disc disease)    cervical responds to steroid injections and low back required surgery  . DDD (degenerative disc disease), lumbosacral   . Diabetes mellitus    pre  . ED (erectile dysfunction) 04/23/2012  . Elevated BP   . Esophageal reflux 02/10/2015  . Fatigue   . HTN (hypertension)   . Hyperglycemia    preDM   . Hyperlipidemia   . Insomnia   . Low  back pain radiating to both legs 01/16/2017  . Measles as a child  . Medicare annual wellness visit, subsequent 02/10/2015  . Overweight(278.02)   . Personal history of colonic polyps 10/27/2012   Follows with [facility]  . Preventative health care   . 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     Social History   Socioeconomic History  . Marital status: Married    Spouse name: Not on file  . Number of children: Not on file  . Years of education: Not on file  . Highest education level: Not on file  Occupational History  . Not on file  Social Needs  . Financial resource strain: Not on file  . Food insecurity    Worry: Not on file    Inability: Not on file  . Transportation needs    Medical: Not on file    Non-medical: Not on file  Tobacco Use  . Smoking status: Former Smoker    Packs/day: 1.00    Years: 20.00    Pack years: 20.00    Types: Cigarettes    Quit date: 07/18/1989    Years since quitting: 29.5  . Smokeless tobacco: Never Used  . Tobacco comment: off and on smoking for the 20 yrs  Substance and Sexual Activity  . Alcohol use: Yes    Comment: drink or two a day- mixed drink  . Drug use: No  . Sexual  activity: Yes    Comment: lives with wife, still working, no dietary restrictions, continues to exercise intermittently  Lifestyle  . Physical activity    Days per week: Not on file    Minutes per session: Not on file  . Stress: Not on file  Relationships  . Social Herbalist on phone: Not on file    Gets together: Not on file    Attends religious service: Not on file    Active member of club or organization: Not on file    Attends meetings of clubs or organizations: Not on file    Relationship status: Not on file  . Intimate partner violence    Fear of current or ex partner: Not on file    Emotionally abused: Not on file    Physically abused: Not on file    Forced sexual activity: Not on file  Other Topics Concern  . Not on file  Social  History Narrative  . Not on file    Past Surgical History:  Procedure Laterality Date  . BACK SURGERY  2012 and 1994   Dr Margret Chance, screws and cage in low back  . TONSILLECTOMY    . torn rotator cuff  2010   right    Family History  Problem Relation Age of Onset  . Hypertension Mother   . Diabetes Mother        type 2  . Cancer Mother 101       breast in remission  . Emphysema Father        smoker  . COPD Father        smoker  . Stroke Father 75       mini  . Heart disease Father   . Hypertension Sister   . Hyperlipidemia Sister   . Scoliosis Sister   . Osteoporosis Sister   . Hypertension Maternal Grandmother   . Scoliosis Maternal Grandmother   . Heart disease Maternal Grandfather   . Heart disease Paternal Grandfather        smoker  . Heart disease Daughter     No Known Allergies  Current Outpatient Medications on File Prior to Visit  Medication Sig Dispense Refill  . acyclovir (ZOVIRAX) 200 MG capsule TAKE ONE CAPSULE BY MOUTH 5 TIMES A DAY 25 capsule 1  . aspirin 81 MG tablet Take 81 mg by mouth daily.    Marland Kitchen atorvastatin (LIPITOR) 20 MG tablet TAKE 1/2 TABLET BY MOUTH DAILY AT 6 PM 45 tablet 0  . b complex vitamins tablet Take 1 tablet by mouth daily.    . celecoxib (CELEBREX) 200 MG capsule Take 1 capsule (200 mg total) by mouth daily. 90 capsule 0  . cyclobenzaprine (FLEXERIL) 5 MG tablet Take 1 tablet (5 mg total) by mouth at bedtime. 4 tablet 0  . diazepam (VALIUM) 10 MG tablet Take 1 tablet (10 mg total) by mouth at bedtime as needed. 30 tablet 1  . famotidine (PEPCID) 40 MG tablet TAKE 1 TABLET BY MOUTH AT BEDTIME 90 tablet 0  . gabapentin (NEURONTIN) 300 MG capsule Take 1 capsule (300 mg total) by mouth 3 (three) times daily. 90 capsule 0  . gabapentin (NEURONTIN) 300 MG capsule     . glucose blood (ONE TOUCH ULTRA TEST) test strip Check daily 32 each 1  . HYDROcodone-acetaminophen (NORCO/VICODIN) 5-325 MG tablet Take 1-2 tablets by mouth every 6 (six)  hours as needed for moderate pain. 50 tablet 0  . HYDROcodone-acetaminophen (NORCO/VICODIN)  5-325 MG tablet     . lisinopril (ZESTRIL) 5 MG tablet Take 1 tablet (5 mg total) by mouth 2 (two) times daily. 180 tablet 1  . metFORMIN (GLUCOPHAGE) 500 MG tablet TAKE ONE TABLET BY MOUTH ONE TIME DAILY  90 tablet 0  . methocarbamol (ROBAXIN) 500 MG tablet Take 1 tablet (500 mg total) by mouth every 8 (eight) hours as needed. 60 tablet 1  . PROAIR HFA 108 (90 Base) MCG/ACT inhaler TAKE 1-2 PUFFS INHALE THREE TIMES A DAY (Patient taking differently: TAKE 1-2 PUFFS INHALE THREE TIMES A DAY AS NEEDED FOR SHORTNESS OF BREATH) 8.5 Inhaler 0  . sildenafil (REVATIO) 20 MG tablet TAKE ONE TO THREE TABLETS BY MOUTH DAILY AS NEEDED 50 tablet 1  . tamsulosin (FLOMAX) 0.4 MG CAPS capsule TAKE TWO CAPSULES BY MOUTH DAILY  180 capsule 0  . Testosterone 30 MG/ACT SOLN Place 60 mg onto the skin every morning. 90 mL 5  . testosterone cypionate (DEPOTESTOSTERONE CYPIONATE) 200 MG/ML injection Inject 1 mL (200 mg total) into the muscle every 14 (fourteen) days. 10 mL 1  . traMADol (ULTRAM) 50 MG tablet TAKE 2 TABLETS BY MOUTH EVERY 6 HOURS AS NEEDED FOR MODERATE PAIN FOR UP TO 7 DAYS 30 tablet 0  . venlafaxine XR (EFFEXOR-XR) 150 MG 24 hr capsule TAKE ONE CAPSULE BY MOUTH ONE TIME DAILY  with breakfast 90 capsule 0  . cetirizine (ZYRTEC) 10 MG tablet Take 1 tablet (10 mg total) by mouth daily as needed for allergies or rhinitis. 30 tablet 11  . fluticasone (FLONASE) 50 MCG/ACT nasal spray Place 2 sprays into the nose daily. 48 g 1   Current Facility-Administered Medications on File Prior to Visit  Medication Dose Route Frequency Provider Last Rate Last Dose  . testosterone cypionate (DEPOTESTOSTERONE CYPIONATE) injection 200 mg  200 mg Intramuscular Q14 Days Penni Homans A, MD   200 mg at 01/09/19 0931    BP 138/62   Pulse 71   Temp 98.4 F (36.9 C) (Oral)   Resp 16   Ht 5\' 10"  (1.778 m)   Wt 202 lb 6.4 oz (91.8 kg)    SpO2 99%   BMI 29.04 kg/m     Objective:   Physical Exam  General- No acute distress. Pleasant patient. Neck- Full range of motion, no jvd Lungs- Clear, even and unlabored. Heart- regular rate and rhythm. Neurologic- CNII- XII grossly intact.  Left knee- some stiffness on rom. No obvious swelling. No crepitus.Negative homan signs.  Left calf- no swelling. Symmetric to rt side. No warmth.  Left ankle- no swelling.      Assessment & Plan:  [phone]  For your chronic left knee pain over the years with recent mild increase in symptoms, will get left knee x-ray today and recommend that she continue current pain medication regimen for your back.  If your knee joint space is significantly decreased then might need to refer you to orthopedist.  For your recent left calf swelling and left ankle swelling which is currently resolved/not present, I think the best way to approach this would be to get d-dimer.  With low clinical suspicion for DVT I think we can rule negative d-dimer.  However if your calf re-swells or you have any popliteal pain then would get ultrasound of lower extremity despite negative test.  Also if your D-dimer test happens to come back elevated will need to order lower extremity ultrasound.  For history of low testosterone was given testosterone injection today.  Spent about 1 year since her last PSA so is a good time to get PSA screening as well.  Follow-up date to be determined depending on study results and how you do clinically.  25 minutes spent with patient.  50% of time spent counseling patient on plan going forward for conditions.  Mackie Pai, PA-C

## 2019-01-22 NOTE — Telephone Encounter (Signed)
Patient wants to transfer care from Dr. [name] to [facility]. Informed patient of the process and will await our approval for officel TOC appointment

## 2019-01-22 NOTE — Telephone Encounter (Signed)
OK with me.

## 2019-01-23 ENCOUNTER — Other Ambulatory Visit (INDEPENDENT_AMBULATORY_CARE_PROVIDER_SITE_OTHER): Payer: Medicare Other

## 2019-01-23 ENCOUNTER — Ambulatory Visit: Payer: Medicare Other

## 2019-01-23 ENCOUNTER — Encounter: Payer: Self-pay | Admitting: Specialist

## 2019-01-23 DIAGNOSIS — R252 Cramp and spasm: Secondary | ICD-10-CM | POA: Diagnosis not present

## 2019-01-23 LAB — COMPREHENSIVE METABOLIC PANEL
ALT: 12 U/L (ref 0–53)
AST: 21 U/L (ref 0–37)
Albumin: 4 g/dL (ref 3.5–5.2)
Alkaline Phosphatase: 58 U/L (ref 39–117)
BUN: 15 mg/dL (ref 6–23)
CO2: 25 mEq/L (ref 19–32)
Calcium: 8.6 mg/dL (ref 8.4–10.5)
Chloride: 101 mEq/L (ref 96–112)
Creatinine, Ser: 0.99 mg/dL (ref 0.40–1.50)
GFR: 73.61 mL/min (ref >60.00)
Glucose, Bld: 213 mg/dL — ABNORMAL HIGH (ref 70–99)
Potassium: 4.5 mEq/L (ref 3.5–5.1)
Sodium: 137 mEq/L (ref 135–145)
Total Bilirubin: 0.4 mg/dL (ref 0.2–1.2)
Total Protein: 5.9 g/dL — ABNORMAL LOW (ref 6.0–8.3)

## 2019-01-23 LAB — MAGNESIUM: Magnesium: 1.9 mg/dL (ref 1.5–2.5)

## 2019-01-24 ENCOUNTER — Other Ambulatory Visit: Payer: Self-pay | Admitting: Radiology

## 2019-01-24 ENCOUNTER — Other Ambulatory Visit: Payer: Self-pay | Admitting: Family Medicine

## 2019-01-24 ENCOUNTER — Encounter: Payer: Self-pay | Admitting: Specialist

## 2019-01-24 MED ORDER — HYDROCODONE-ACETAMINOPHEN 5-325 MG PO TABS: 1.0000 | ORAL_TABLET | Freq: Four times a day (QID) | ORAL | 0 refills | Status: DC | PRN

## 2019-01-24 NOTE — Telephone Encounter (Signed)
Luis Brown patient wants you to become his PCP... Dr [name] doesn't mind  Please advise

## 2019-01-24 NOTE — Telephone Encounter (Signed)
He ha requested a switch to [employer] [facility]. If Luis Brown approves will need to switch rx over but sent in refill for now

## 2019-01-24 NOTE — Telephone Encounter (Signed)
I am ok with the switch. Dr. [name] filled his valium. Is he on contract and uds. Does he get valium regular basis or occasionally. I see he had has has rx for pain medications as well. Would you advise him to be cautious and avoid using both valium and norco at same time within 8 hours of each medications(prefer separate use by as much as 12 hours). Ask him to follow up in 6 weeks to discuss leg cramping and can discuss meds further.

## 2019-01-24 NOTE — Telephone Encounter (Signed)
Requesting:valium Contract:yes UDS:low risk next screen 02/15/19 Last OV:01/23/19 Next OV:n/a Last Refill:09/12/18  #[id number] Database:   Please advise

## 2019-01-27 ENCOUNTER — Other Ambulatory Visit: Payer: Self-pay | Admitting: Family Medicine

## 2019-01-29 NOTE — Telephone Encounter (Signed)
Kirsten can you call patient and set up 6 week follow up with Percell Miller

## 2019-01-29 NOTE — Telephone Encounter (Signed)
He does take the valium as needed the hydrocodone is Prescribed by his orthopedics he knows not to take them together, yes he is on a contract and he is ok with UDS. He also does testosterone injections every 14 days.

## 2019-02-01 ENCOUNTER — Other Ambulatory Visit: Payer: Self-pay

## 2019-02-04 ENCOUNTER — Other Ambulatory Visit: Payer: Self-pay

## 2019-02-04 ENCOUNTER — Encounter: Payer: Self-pay | Admitting: Specialist

## 2019-02-04 NOTE — Telephone Encounter (Signed)
Surgery Scheduled for 7/28. Unavailable for f/u appt until September per patient.

## 2019-02-05 ENCOUNTER — Ambulatory Visit (INDEPENDENT_AMBULATORY_CARE_PROVIDER_SITE_OTHER): Payer: Medicare Other

## 2019-02-05 ENCOUNTER — Encounter: Payer: Self-pay | Admitting: Specialist

## 2019-02-05 DIAGNOSIS — E349 Endocrine disorder, unspecified: Secondary | ICD-10-CM

## 2019-02-05 MED ORDER — TESTOSTERONE CYPIONATE 200 MG/ML IM SOLN
200.0000 mg | INTRAMUSCULAR | Status: DC
  Administered 2019-02-05: 200 mg via INTRAMUSCULAR

## 2019-02-05 MED ORDER — TESTOSTERONE CYPIONATE 200 MG/ML IM SOLN: 200.0000 mg | INTRAMUSCULAR | 5 refills | Status: DC

## 2019-02-05 NOTE — Progress Notes (Addendum)
Patient here today for testosterone injection. He receives these injections every 2 weeks. 5mL testosterone given in patients right upper outter quadrant IM. Patient tolerated well.  Reviewed and agree with administration of testosterone  Mackie Pai, PA-C

## 2019-02-06 ENCOUNTER — Ambulatory Visit: Payer: Medicare Other | Admitting: Surgery

## 2019-02-06 ENCOUNTER — Other Ambulatory Visit: Payer: Self-pay | Admitting: Radiology

## 2019-02-06 MED ORDER — HYDROCODONE-ACETAMINOPHEN 5-325 MG PO TABS: 1.0000 | ORAL_TABLET | Freq: Four times a day (QID) | ORAL | 0 refills | Status: DC | PRN

## 2019-02-07 ENCOUNTER — Encounter: Payer: Self-pay | Admitting: Surgery

## 2019-02-07 ENCOUNTER — Ambulatory Visit (INDEPENDENT_AMBULATORY_CARE_PROVIDER_SITE_OTHER): Payer: Medicare Other | Admitting: Surgery

## 2019-02-07 ENCOUNTER — Telehealth: Payer: Self-pay | Admitting: Medical

## 2019-02-07 VITALS — BP 101/57 | HR 84 | Ht 70.0 in | Wt 202.0 lb

## 2019-02-07 DIAGNOSIS — T84498A Other mechanical complication of other internal orthopedic devices, implants and grafts, initial encounter: Secondary | ICD-10-CM

## 2019-02-07 DIAGNOSIS — M4804 Spinal stenosis, thoracic region: Secondary | ICD-10-CM

## 2019-02-07 NOTE — Pre-Procedure Instructions (Addendum)
[id number] - [place], [place] [address] [place] [address] Phone: [phone] Fax: [phone]      Your procedure is scheduled on Tuesday July 28th.  Report to [facility] Main Entrance "A" at 5:30 A.M., and check in at the Admitting office.  Call this number if you have problems the morning of surgery:  [phone]  Call [phone] if you have any questions prior to your surgery date Monday-Friday 8am-4pm    Remember:  Do not eat after midnight the night before your surgery  Patient Instructions  . The day of surgery (if you have diabetes): o Drink ONE (1) Gatorade 2 (G2) as directed. o This drink was given to you during your hospital  pre-op appointment visit.  o The pre-op nurse will instruct you on the time to drink the   Gatorade 2 (G2) depending on your surgery time. o Color of the Gatorade may vary. Red is not allowed. o Nothing else to drink after completing the  Gatorade 2 (G2).         If you have questions, please contact your surgeon's office.     Take these medicines the morning of surgery with A SIP OF WATER  cetirizine (ZYRTEC) gabapentin (NEURONTIN)  tamsulosin (FLOMAX) venlafaxine XR (EFFEXOR-XR)  HYDROcodone-acetaminophen (NORCO/VICODIN)-if needed   HOW TO MANAGE YOUR DIABETES BEFORE AND AFTER SURGERY  Why is it important to control my blood sugar before and after surgery? . Improving blood sugar levels before and after surgery helps healing and can limit problems. . A way of improving blood sugar control is eating a healthy diet by: o  Eating less sugar and carbohydrates o  Increasing activity/exercise o  Talking with your doctor about reaching your blood sugar goals . High blood sugars (greater than 180 mg/dL) can raise your risk of infections and slow your recovery, so you will need to focus on controlling your diabetes during the weeks before surgery. . Make sure that the doctor who takes care of your  diabetes knows about your planned surgery including the date and location.  How do I manage my blood sugar before surgery? . Check your blood sugar at least 4 times a day, starting 2 days before surgery, to make sure that the level is not too high or low. o Check your blood sugar the morning of your surgery when you wake up and every 2 hours until you get to the Short Stay unit. . If your blood sugar is less than 70 mg/dL, you will need to treat for low blood sugar: o Do not take insulin. o Treat a low blood sugar (less than 70 mg/dL) with  cup of clear juice (cranberry or apple), 4 glucose tablets, OR glucose gel. Recheck blood sugar in 15 minutes after treatment (to make sure it is greater than 70 mg/dL). If your blood sugar is not greater than 70 mg/dL on recheck, call [phone] o  for further instructions. . Report your blood sugar to the short stay nurse when you get to Short Stay.  . If you are admitted to the hospital after surgery: o Your blood sugar will be checked by the staff and you will probably be given insulin after surgery (instead of oral diabetes medicines) to make sure you have good blood sugar levels. o The goal for blood sugar control after surgery is 80-180 mg/dL.     WHAT DO I DO ABOUT MY DIABETES MEDICATION?   Marland Kitchen Do not take oral diabetes medicines (  pills):metFORMIN (GLUCOPHAGE)  the morning of surgery.   Follow your surgeon's instructions on when to stop Asprin.  If no instructions were given by your surgeon then you will need to call the office to get those instructions.     As of today, STOP taking any Aspirin (unless otherwise instructed by your surgeon), celecoxib (CELEBREX) Aleve, Naproxen, Ibuprofen, Motrin, Advil, Goody's, BC's, all herbal medications, fish oil, and all vitamins.    The Morning of Surgery  Do not wear jewelry, make-up or nail polish.  Do not wear lotions, powders, or perfumes/colognes, or deodorant  Do not shave 48 hours prior to  surgery.  Men may shave face and neck.  Do not bring valuables to the hospital.  [facility] is not responsible for any belongings or valuables.  If you are a smoker, DO NOT Smoke 24 hours prior to surgery IF you wear a CPAP at night please bring your mask, tubing, and machine the morning of surgery   Remember that you must have someone to transport you home after your surgery, and remain with you for 24 hours if you are discharged the same day.   Contacts, glasses, hearing aids, dentures or bridgework may not be worn into surgery.    Leave your suitcase in the car.  After surgery it may be brought to your room.  For patients admitted to the hospital, discharge time will be determined by your treatment team.  Patients discharged the day of surgery will not be allowed to drive home.    Special instructions:   [facility]- Preparing For Surgery  Before surgery, you can play an important role. Because skin is not sterile, your skin needs to be as free of germs as possible. You can reduce the number of germs on your skin by washing with CHG (chlorahexidine gluconate) Soap before surgery.  CHG is an antiseptic cleaner which kills germs and bonds with the skin to continue killing germs even after washing.    Oral Hygiene is also important to reduce your risk of infection.  Remember - BRUSH YOUR TEETH THE MORNING OF SURGERY WITH YOUR REGULAR TOOTHPASTE  Please do not use if you have an allergy to CHG or antibacterial soaps. If your skin becomes reddened/irritated stop using the CHG.  Do not shave (including legs and underarms) for at least 48 hours prior to first CHG shower. It is OK to shave your face.  Please follow these instructions carefully.   1. Shower the NIGHT BEFORE SURGERY and the MORNING OF SURGERY with CHG Soap.   2. If you chose to wash your hair, wash your hair first as usual with your normal shampoo.  3. After you shampoo, rinse your hair and body thoroughly to remove the  shampoo.  4. Use CHG as you would any other liquid soap. You can apply CHG directly to the skin and wash gently with a scrungie or a clean washcloth.   5. Apply the CHG Soap to your body ONLY FROM THE NECK DOWN.  Do not use on open wounds or open sores. Avoid contact with your eyes, ears, mouth and genitals (private parts). Wash Face and genitals (private parts)  with your normal soap.   6. Wash thoroughly, paying special attention to the area where your surgery will be performed.  7. Thoroughly rinse your body with warm water from the neck down.  8. DO NOT shower/wash with your normal soap after using and rinsing off the CHG Soap.  9. Pat yourself dry  with a CLEAN TOWEL.  10. Wear CLEAN PAJAMAS to bed the night before surgery, wear comfortable clothes the morning of surgery  11. Place CLEAN SHEETS on your bed the night of your first shower and DO NOT SLEEP WITH PETS.    Day of Surgery:  Do not apply any deodorants/lotions. Please shower the morning of surgery with the CHG soap  Please wear clean clothes to the hospital/surgery center.   Remember to brush your teeth WITH YOUR REGULAR TOOTHPASTE.   Please read over the following fact sheets that you were given.

## 2019-02-07 NOTE — Telephone Encounter (Signed)
I see that patient is getting upcoming surgery. I think pretty soon. If we get pre-op clearance form to sign then I need to review ekg done recently by specialist and would want to review labs done by specialist.  I saw him recently in office an examined him recently.

## 2019-02-07 NOTE — Progress Notes (Signed)
73 year old white male with history of thoracic HNP and hardware loosening comes in for preop evaluation.  States that symptoms unchanged from previous visit.  He is wanting to proceed with DECOMPRESSIVE LAMINECTOMY T9-10 AND T10-11, EXTENSION OF THORACOLUMBAR FUSION FROM T10 TO T5, LOCAL BONE GRAFT, ALLOGRAFT AND VIVIGEN as scheduled.  Surgical procedure discussed along with potential rehab/recovery time.  All questions answered and he wishes to proceed.

## 2019-02-08 ENCOUNTER — Encounter (HOSPITAL_COMMUNITY): Payer: Self-pay

## 2019-02-08 ENCOUNTER — Ambulatory Visit (HOSPITAL_COMMUNITY)
Admission: RE | Admit: 2019-02-08 | Discharge: 2019-02-08 | Disposition: A | Discharge status: Home / Self Care | Payer: Medicare Other | Source: Ambulatory Visit | Attending: Surgery | Admitting: Surgery

## 2019-02-08 ENCOUNTER — Encounter (HOSPITAL_COMMUNITY)
Admission: RE | Admit: 2019-02-08 | Discharge: 2019-02-08 | Disposition: A | Discharge status: Home / Self Care | Payer: Medicare Other | Source: Ambulatory Visit | Attending: Specialist | Admitting: Specialist

## 2019-02-08 ENCOUNTER — Other Ambulatory Visit: Payer: Self-pay

## 2019-02-08 ENCOUNTER — Other Ambulatory Visit (HOSPITAL_COMMUNITY)
Admission: RE | Admit: 2019-02-08 | Discharge: 2019-02-08 | Disposition: A | Discharge status: Home / Self Care | Payer: Medicare Other | Source: Ambulatory Visit | Attending: Specialist | Admitting: Specialist

## 2019-02-08 DIAGNOSIS — J90 Pleural effusion, not elsewhere classified: Secondary | ICD-10-CM | POA: Diagnosis not present

## 2019-02-08 DIAGNOSIS — Z981 Arthrodesis status: Secondary | ICD-10-CM | POA: Diagnosis not present

## 2019-02-08 DIAGNOSIS — Z833 Family history of diabetes mellitus: Secondary | ICD-10-CM | POA: Diagnosis not present

## 2019-02-08 DIAGNOSIS — M5134 Other intervertebral disc degeneration, thoracic region: Secondary | ICD-10-CM | POA: Diagnosis not present

## 2019-02-08 DIAGNOSIS — Y832 Surgical operation with anastomosis, bypass or graft as the cause of abnormal reaction of the patient, or of later complication, without mention of misadventure at the time of the procedure: Secondary | ICD-10-CM | POA: Diagnosis present

## 2019-02-08 DIAGNOSIS — M4804 Spinal stenosis, thoracic region: Secondary | ICD-10-CM | POA: Insufficient documentation

## 2019-02-08 DIAGNOSIS — I10 Essential (primary) hypertension: Secondary | ICD-10-CM | POA: Diagnosis not present

## 2019-02-08 DIAGNOSIS — E785 Hyperlipidemia, unspecified: Secondary | ICD-10-CM | POA: Insufficient documentation

## 2019-02-08 DIAGNOSIS — T84038A Mechanical loosening of other internal prosthetic joint, initial encounter: Secondary | ICD-10-CM | POA: Diagnosis not present

## 2019-02-08 DIAGNOSIS — T84498A Other mechanical complication of other internal orthopedic devices, implants and grafts, initial encounter: Secondary | ICD-10-CM | POA: Diagnosis not present

## 2019-02-08 DIAGNOSIS — M549 Dorsalgia, unspecified: Secondary | ICD-10-CM | POA: Diagnosis not present

## 2019-02-08 DIAGNOSIS — Z1159 Encounter for screening for other viral diseases: Secondary | ICD-10-CM | POA: Insufficient documentation

## 2019-02-08 DIAGNOSIS — M4325 Fusion of spine, thoracolumbar region: Secondary | ICD-10-CM | POA: Diagnosis not present

## 2019-02-08 DIAGNOSIS — Z7984 Long term (current) use of oral hypoglycemic drugs: Secondary | ICD-10-CM | POA: Diagnosis not present

## 2019-02-08 DIAGNOSIS — M48061 Spinal stenosis, lumbar region without neurogenic claudication: Secondary | ICD-10-CM | POA: Diagnosis not present

## 2019-02-08 DIAGNOSIS — X58XXXA Exposure to other specified factors, initial encounter: Secondary | ICD-10-CM | POA: Diagnosis not present

## 2019-02-08 DIAGNOSIS — M5124 Other intervertebral disc displacement, thoracic region: Secondary | ICD-10-CM | POA: Diagnosis not present

## 2019-02-08 DIAGNOSIS — K219 Gastro-esophageal reflux disease without esophagitis: Secondary | ICD-10-CM | POA: Diagnosis not present

## 2019-02-08 DIAGNOSIS — Z01818 Encounter for other preprocedural examination: Secondary | ICD-10-CM | POA: Insufficient documentation

## 2019-02-08 DIAGNOSIS — Z87891 Personal history of nicotine dependence: Secondary | ICD-10-CM | POA: Insufficient documentation

## 2019-02-08 DIAGNOSIS — R7303 Prediabetes: Secondary | ICD-10-CM | POA: Diagnosis not present

## 2019-02-08 DIAGNOSIS — M40204 Unspecified kyphosis, thoracic region: Secondary | ICD-10-CM | POA: Diagnosis not present

## 2019-02-08 DIAGNOSIS — Z79899 Other long term (current) drug therapy: Secondary | ICD-10-CM | POA: Diagnosis not present

## 2019-02-08 LAB — COMPREHENSIVE METABOLIC PANEL
ALT: 16 U/L (ref 0–44)
AST: 35 U/L (ref 15–41)
Albumin: 4 g/dL (ref 3.5–5.0)
Alkaline Phosphatase: 46 U/L (ref 38–126)
Anion gap: 13 (ref 5–15)
BUN: 23 mg/dL (ref 8–23)
CO2: 23 mmol/L (ref 22–32)
Calcium: 9.4 mg/dL (ref 8.9–10.3)
Chloride: 98 mmol/L (ref 98–111)
Creatinine, Ser: 0.91 mg/dL (ref 0.61–1.24)
GFR calc Af Amer: >60 mL/min (ref >60)
GFR calc non Af Amer: >60 mL/min (ref >60)
Glucose, Bld: 140 mg/dL — ABNORMAL HIGH (ref 70–99)
Potassium: 4.5 mmol/L (ref 3.5–5.1)
Sodium: 134 mmol/L — ABNORMAL LOW (ref 135–145)
Total Bilirubin: 0.9 mg/dL (ref 0.3–1.2)
Total Protein: 6.6 g/dL (ref 6.5–8.1)

## 2019-02-08 LAB — TYPE AND SCREEN
ABO/RH(D): A POS
Antibody Screen: NEGATIVE

## 2019-02-08 LAB — SURGICAL PCR SCREEN
MRSA, PCR: NEGATIVE
Staphylococcus aureus: POSITIVE — AB

## 2019-02-08 LAB — CBC
HCT: 45.4 % (ref 39.0–52.0)
Hemoglobin: 15.2 g/dL (ref 13.0–17.0)
MCH: 32.3 pg (ref 26.0–34.0)
MCHC: 33.5 g/dL (ref 30.0–36.0)
MCV: 96.4 fL (ref 80.0–100.0)
Platelets: 333 10*3/uL (ref 150–400)
RBC: 4.71 MIL/uL (ref 4.22–5.81)
RDW: 14.7 % (ref 11.5–15.5)
WBC: 5.9 10*3/uL (ref 4.0–10.5)
nRBC: 0 % (ref 0.0–0.2)

## 2019-02-08 LAB — GLUCOSE, CAPILLARY: Glucose-Capillary: 138 mg/dL — ABNORMAL HIGH (ref 70–99)

## 2019-02-08 LAB — PROTIME-INR
INR: 1 (ref 0.8–1.2)
Prothrombin Time: 13.5 seconds (ref 11.4–15.2)

## 2019-02-08 LAB — APTT: aPTT: 33 seconds (ref 24–36)

## 2019-02-08 NOTE — Progress Notes (Signed)
PCP - Mackie Pai, PA-C Cardiologist - denies  Chest x-ray - 02/08/19 EKG - 02/08/19 Stress Test - 15+ years ago ECHO - denies Cardiac Cath - denies  Sleep Study - denies  Checks CBG's QOD; Fasting 140's   Aspirin Instructions: Patient instructed to hold all Aspirin, NSAID's, herbal medications, fish oil and vitamins 7 days prior to surgery.   Anesthesia review:   Patient denies shortness of breath, fever, cough and chest pain at PAT appointment   Patient verbalized understanding of instructions that were given to them at the PAT appointment. Patient was also instructed that they will need to review over the PAT instructions again at home before surgery.

## 2019-02-08 NOTE — H&P (Signed)
Luis Brown is an 73 y.o. male.   Chief Complaint: Worsening back pain and hardware loosening HPI: 73 year old white male with history of thoracic HNP and hardware loosening comes in for preop evaluation.  States that symptoms unchanged from previous visit.  He is wanting to proceed with DECOMPRESSIVE LAMINECTOMY T9-10 AND T10-11, EXTENSION OF THORACOLUMBAR FUSION FROM T10 TO T5, LOCAL BONE GRAFT, ALLOGRAFT AND VIVIGEN as scheduled  Past Medical History:  Diagnosis Date  . Acute pharyngitis 10/21/2013  . Allergy    grass and pollen  . Anxiety and depression 10/25/2011  . BPH (benign prostatic hyperplasia) 04/23/2012  . Chicken pox as a child  . DDD (degenerative disc disease)    cervical responds to steroid injections and low back required surgery  . DDD (degenerative disc disease), lumbosacral   . Diabetes mellitus    pre  . ED (erectile dysfunction) 04/23/2012  . Elevated BP   . Esophageal reflux 02/10/2015  . Fatigue   . HTN (hypertension)   . Hyperglycemia    preDM   . Hyperlipidemia   . Insomnia   . Low back pain radiating to both legs 01/16/2017  . Measles as a child  . Medicare annual wellness visit, subsequent 02/10/2015  . Overweight(278.02)   . Personal history of colonic polyps 10/27/2012   Follows with [facility]  . Preventative health care   . 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    Past Surgical History:  Procedure Laterality Date  . BACK SURGERY  2012 and 1994   Dr Margret Chance, screws and cage in low back  . EYE SURGERY Bilateral    2016  . TONSILLECTOMY    . TONSILLECTOMY     as child  . torn rotator cuff  2010   right    Family History  Problem Relation Age of Onset  . Hypertension Mother   . Diabetes Mother        type 2  . Cancer Mother 49       breast in remission  . Emphysema Father        smoker  . COPD Father        smoker  . Stroke Father 61       mini  . Heart disease Father   . Hypertension Sister   . Hyperlipidemia Sister   .  Scoliosis Sister   . Osteoporosis Sister   . Hypertension Maternal Grandmother   . Scoliosis Maternal Grandmother   . Heart disease Maternal Grandfather   . Heart disease Paternal Grandfather        smoker  . Heart disease Daughter    Social History:  reports that he quit smoking about 29 years ago. His smoking use included cigarettes. He has a 20.00 pack-year smoking history. He has never used smokeless tobacco. He reports current alcohol use. He reports that he does not use drugs.  Allergies: No Known Allergies  No medications prior to admission.    Results for orders placed or performed during the hospital encounter of 02/12/19 (from the past 48 hour(s))  CBC     Status: None   Collection Time: 02/08/19  9:43 AM  Result Value Ref Range   WBC 5.9 4.0 - 10.5 K/uL   RBC 4.71 4.22 - 5.81 MIL/uL   Hemoglobin 15.2 13.0 - 17.0 g/dL   HCT 45.4 39.0 - 52.0 %   MCV 96.4 80.0 - 100.0 fL   MCH 32.3 26.0 - 34.0 pg   MCHC 33.5 30.0 - 36.0 g/dL  RDW 14.7 11.5 - 15.5 %   Platelets 333 150 - 400 K/uL   nRBC 0.0 0.0 - 0.2 %    Comment: Performed at [facility], [place] [address], [place], [place] [address]  Comprehensive metabolic panel     Status: Abnormal   Collection Time: 02/08/19  9:43 AM  Result Value Ref Range   Sodium 134 (L) 135 - 145 mmol/L   Potassium 4.5 3.5 - 5.1 mmol/L   Chloride 98 98 - 111 mmol/L   CO2 23 22 - 32 mmol/L   Glucose, Bld 140 (H) 70 - 99 mg/dL   BUN 23 8 - 23 mg/dL   Creatinine, Ser 0.91 0.61 - 1.24 mg/dL   Calcium 9.4 8.9 - 10.3 mg/dL   Total Protein 6.6 6.5 - 8.1 g/dL   Albumin 4.0 3.5 - 5.0 g/dL   AST 35 15 - 41 U/L   ALT 16 0 - 44 U/L   Alkaline Phosphatase 46 38 - 126 U/L   Total Bilirubin 0.9 0.3 - 1.2 mg/dL   GFR calc non Af Amer >60 >60 mL/min   GFR calc Af Amer >60 >60 mL/min   Anion gap 13 5 - 15    Comment: Performed at [facility] [address], [place], [place] [address]  Protime-INR     Status: None   Collection Time:  02/08/19  9:43 AM  Result Value Ref Range   Prothrombin Time 13.5 11.4 - 15.2 seconds   INR 1.0 0.8 - 1.2    Comment: (NOTE) INR goal varies based on device and disease states. Performed at [facility], [place] [address] [place], [place] [address]   APTT     Status: None   Collection Time: 02/08/19  9:43 AM  Result Value Ref Range   aPTT 33 24 - 36 seconds    Comment: Performed at [facility] [address], [place], [place] [address]  Type and screen Order type and screen if day of surgery is less than 15 days from draw of preadmission visit or order morning of surgery if day of surgery is greater than 6 days from preadmission visit.     Status: None   Collection Time: 02/08/19  9:54 AM  Result Value Ref Range   ABO/RH(D) A POS    Antibody Screen NEG    Sample Expiration 02/22/2019,2359    Extend sample reason      NO TRANSFUSIONS OR PREGNANCY IN THE PAST 3 MONTHS Performed at [facility], [place] [address] [place], [place] [address]    Dg Chest 2 View  Result Date: 02/08/2019 CLINICAL DATA:  Preoperative radiograph. EXAM: CHEST - 2 VIEW COMPARISON:  May 17, 2017 FINDINGS: Cardiomediastinal silhouette is normal. Mediastinal contours appear intact. There is no evidence of focal airspace consolidation, pleural effusion or pneumothorax. Thoracolumbar spine fusion noted.  Soft tissues are grossly normal. IMPRESSION: No active cardiopulmonary disease. Electronically Signed   By: Fidela Salisbury M.D.   On: 02/08/2019 11:50    Review of Systems  Constitutional: Negative.   HENT: Negative.   Cardiovascular: Negative.   Genitourinary: Negative.   Musculoskeletal: Positive for back pain.  Skin: Negative.   Neurological: Positive for tingling.  Psychiatric/Behavioral: Negative.     There were no vitals taken for this visit. Physical Exam   Assessment/Plan Back pain, thoracic HNP and hardware loosening   We will proceed with DECOMPRESSIVE LAMINECTOMY  T9-10 AND T10-11, EXTENSION OF THORACOLUMBAR  FUSION FROM T10 TO T5, LOCAL BONE GRAFT, ALLOGRAFT AND VIVIGEN as scheduled.  Surgical procedure along with potential rehab/recovery time discussed.  All questions answered and he wishes to proceed.  Benjiman Core, PA-C 02/08/2019, 4:27 PM

## 2019-02-09 LAB — SARS CORONAVIRUS 2 (TAT 6-24 HRS): SARS Coronavirus 2: NEGATIVE

## 2019-02-10 ENCOUNTER — Other Ambulatory Visit: Payer: Self-pay | Admitting: Specialist

## 2019-02-11 ENCOUNTER — Encounter: Payer: Self-pay | Admitting: Medical

## 2019-02-11 ENCOUNTER — Encounter: Payer: Self-pay | Admitting: Specialist

## 2019-02-11 MED ORDER — BUPIVACAINE LIPOSOME 1.3 % IJ SUSP
20.0000 mL | Freq: Once | INTRAMUSCULAR | Status: DC
  Filled 2019-02-11 (×2): qty 20

## 2019-02-11 NOTE — Anesthesia Preprocedure Evaluation (Addendum)
Anesthesia Evaluation  Patient identified by MRN, date of birth, ID band Patient awake    Reviewed: Allergy & Precautions, NPO status , Patient's Chart, lab work & pertinent test results  Airway Mallampati: II  TM Distance: >3 FB Neck ROM: Full    Dental  (+) Missing   Pulmonary former smoker,    Pulmonary exam normal breath sounds clear to auscultation       Cardiovascular Exercise Tolerance: Good hypertension, Pt. 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, proximal junctional kyphosis above T10-S1 fusion, degenerative disc disease T8-9, T9-10  Reproductive/Obstetrics                            Anesthesia Physical Anesthesia Plan  ASA: III  Anesthesia Plan: General   Post-op Pain Management:    Induction: Intravenous  PONV Risk Score and Plan: 3 and Midazolam, Dexamethasone, Ondansetron and Treatment may vary due to age or medical condition  Airway Management Planned: Oral ETT  Additional Equipment: Arterial line  Intra-op Plan:   Post-operative Plan: Extubation in OR  Informed Consent: I have reviewed the patients History and Physical, chart, labs and discussed the procedure including the risks, benefits and alternatives for the proposed anesthesia with the patient or authorized representative who has indicated his/her understanding and acceptance.     Dental advisory given  Plan Discussed with: CRNA  Anesthesia Plan Comments:         Anesthesia Quick Evaluation

## 2019-02-11 NOTE — Telephone Encounter (Signed)
Please advise 

## 2019-02-12 ENCOUNTER — Inpatient Hospital Stay (HOSPITAL_COMMUNITY): Payer: Medicare Other | Admitting: Anesthesiology

## 2019-02-12 ENCOUNTER — Encounter (HOSPITAL_COMMUNITY): Payer: Self-pay | Admitting: Anesthesiology

## 2019-02-12 ENCOUNTER — Encounter (HOSPITAL_COMMUNITY)
Admission: RE | Disposition: A | Discharge status: Home Health | Payer: Self-pay | Source: Home / Self Care | Attending: Specialist

## 2019-02-12 ENCOUNTER — Inpatient Hospital Stay (HOSPITAL_COMMUNITY): Payer: Medicare Other

## 2019-02-12 ENCOUNTER — Inpatient Hospital Stay (HOSPITAL_COMMUNITY)
Admission: RE | Admit: 2019-02-12 | Discharge: 2019-02-15 | DRG: 455 | Disposition: A | Discharge status: Home Health | Payer: Medicare Other | Attending: Specialist | Admitting: Specialist

## 2019-02-12 DIAGNOSIS — T84498A Other mechanical complication of other internal orthopedic devices, implants and grafts, initial encounter: Secondary | ICD-10-CM | POA: Diagnosis not present

## 2019-02-12 DIAGNOSIS — Y832 Surgical operation with anastomosis, bypass or graft as the cause of abnormal reaction of the patient, or of later complication, without mention of misadventure at the time of the procedure: Secondary | ICD-10-CM | POA: Diagnosis present

## 2019-02-12 DIAGNOSIS — R7303 Prediabetes: Secondary | ICD-10-CM | POA: Diagnosis not present

## 2019-02-12 DIAGNOSIS — E349 Endocrine disorder, unspecified: Secondary | ICD-10-CM

## 2019-02-12 DIAGNOSIS — Z7984 Long term (current) use of oral hypoglycemic drugs: Secondary | ICD-10-CM | POA: Diagnosis not present

## 2019-02-12 DIAGNOSIS — Z833 Family history of diabetes mellitus: Secondary | ICD-10-CM | POA: Diagnosis not present

## 2019-02-12 DIAGNOSIS — T84038A Mechanical loosening of other internal prosthetic joint, initial encounter: Secondary | ICD-10-CM | POA: Diagnosis not present

## 2019-02-12 DIAGNOSIS — Z981 Arthrodesis status: Secondary | ICD-10-CM | POA: Diagnosis not present

## 2019-02-12 DIAGNOSIS — M5124 Other intervertebral disc displacement, thoracic region: Secondary | ICD-10-CM | POA: Diagnosis not present

## 2019-02-12 DIAGNOSIS — K219 Gastro-esophageal reflux disease without esophagitis: Secondary | ICD-10-CM | POA: Diagnosis not present

## 2019-02-12 DIAGNOSIS — M4325 Fusion of spine, thoracolumbar region: Secondary | ICD-10-CM | POA: Diagnosis not present

## 2019-02-12 DIAGNOSIS — Z79899 Other long term (current) drug therapy: Secondary | ICD-10-CM | POA: Diagnosis not present

## 2019-02-12 DIAGNOSIS — Z419 Encounter for procedure for purposes other than remedying health state, unspecified: Secondary | ICD-10-CM

## 2019-02-12 DIAGNOSIS — I10 Essential (primary) hypertension: Secondary | ICD-10-CM | POA: Diagnosis not present

## 2019-02-12 DIAGNOSIS — M549 Dorsalgia, unspecified: Secondary | ICD-10-CM | POA: Diagnosis present

## 2019-02-12 DIAGNOSIS — M5134 Other intervertebral disc degeneration, thoracic region: Secondary | ICD-10-CM

## 2019-02-12 DIAGNOSIS — Z87891 Personal history of nicotine dependence: Secondary | ICD-10-CM

## 2019-02-12 DIAGNOSIS — Z01818 Encounter for other preprocedural examination: Secondary | ICD-10-CM

## 2019-02-12 DIAGNOSIS — E785 Hyperlipidemia, unspecified: Secondary | ICD-10-CM | POA: Diagnosis present

## 2019-02-12 DIAGNOSIS — M4804 Spinal stenosis, thoracic region: Secondary | ICD-10-CM | POA: Diagnosis present

## 2019-02-12 HISTORY — PX: LAMINECTOMY: SHX219

## 2019-02-12 HISTORY — PX: SPINAL FUSION: SHX223

## 2019-02-12 LAB — CBC WITH DIFFERENTIAL/PLATELET
Abs Immature Granulocytes: 0.07 10*3/uL (ref 0.00–0.07)
Basophils Absolute: 0 10*3/uL (ref 0.0–0.1)
Basophils Relative: 0 %
Eosinophils Absolute: 0 10*3/uL (ref 0.0–0.5)
Eosinophils Relative: 0 %
HCT: 35 % — ABNORMAL LOW (ref 39.0–52.0)
Hemoglobin: 11.9 g/dL — ABNORMAL LOW (ref 13.0–17.0)
Immature Granulocytes: 0 %
Lymphocytes Relative: 2 %
Lymphs Abs: 0.3 10*3/uL — ABNORMAL LOW (ref 0.7–4.0)
MCH: 32.7 pg (ref 26.0–34.0)
MCHC: 34 g/dL (ref 30.0–36.0)
MCV: 96.2 fL (ref 80.0–100.0)
Monocytes Absolute: 1.2 10*3/uL — ABNORMAL HIGH (ref 0.1–1.0)
Monocytes Relative: 8 %
Neutro Abs: 14.3 10*3/uL — ABNORMAL HIGH (ref 1.7–7.7)
Neutrophils Relative %: 90 %
Platelets: 285 10*3/uL (ref 150–400)
RBC: 3.64 MIL/uL — ABNORMAL LOW (ref 4.22–5.81)
RDW: 14.6 % (ref 11.5–15.5)
WBC: 15.8 10*3/uL — ABNORMAL HIGH (ref 4.0–10.5)
nRBC: 0 % (ref 0.0–0.2)

## 2019-02-12 LAB — BASIC METABOLIC PANEL
Anion gap: 10 (ref 5–15)
BUN: 13 mg/dL (ref 8–23)
CO2: 23 mmol/L (ref 22–32)
Calcium: 7.9 mg/dL — ABNORMAL LOW (ref 8.9–10.3)
Chloride: 97 mmol/L — ABNORMAL LOW (ref 98–111)
Creatinine, Ser: 0.89 mg/dL (ref 0.61–1.24)
GFR calc Af Amer: >60 mL/min (ref >60)
GFR calc non Af Amer: >60 mL/min (ref >60)
Glucose, Bld: 195 mg/dL — ABNORMAL HIGH (ref 70–99)
Potassium: 4.1 mmol/L (ref 3.5–5.1)
Sodium: 130 mmol/L — ABNORMAL LOW (ref 135–145)

## 2019-02-12 LAB — GLUCOSE, CAPILLARY
Glucose-Capillary: 126 mg/dL — ABNORMAL HIGH (ref 70–99)
Glucose-Capillary: 192 mg/dL — ABNORMAL HIGH (ref 70–99)
Glucose-Capillary: 201 mg/dL — ABNORMAL HIGH (ref 70–99)

## 2019-02-12 SURGERY — FUSION, SPINE, 2 OR MORE LEVELS, POSTERIOR APPROACH
Anesthesia: General | Site: Spine Thoracic

## 2019-02-12 MED ORDER — THROMBIN 20000 UNITS EX SOLR
CUTANEOUS | Status: DC | PRN
  Administered 2019-02-12: 20 mL

## 2019-02-12 MED ORDER — OXYCODONE HCL ER 20 MG PO T12A
20.0000 mg | EXTENDED_RELEASE_TABLET | Freq: Two times a day (BID) | ORAL | Status: DC
  Administered 2019-02-13 – 2019-02-15 (×5): 20 mg via ORAL
  Filled 2019-02-12 (×5): qty 1

## 2019-02-12 MED ORDER — HYDROCODONE-ACETAMINOPHEN 5-325 MG PO TABS
1.0000 | ORAL_TABLET | Freq: Four times a day (QID) | ORAL | Status: DC | PRN
  Administered 2019-02-12 – 2019-02-15 (×10): 2 via ORAL
  Filled 2019-02-12 (×10): qty 2

## 2019-02-12 MED ORDER — BUPIVACAINE LIPOSOME 1.3 % IJ SUSP
INTRAMUSCULAR | Status: DC | PRN
  Administered 2019-02-12: 10 mL

## 2019-02-12 MED ORDER — ONDANSETRON HCL 4 MG/2ML IJ SOLN: 4.0000 mg | Freq: Four times a day (QID) | INTRAMUSCULAR | Status: DC | PRN

## 2019-02-12 MED ORDER — FLEET ENEMA 7-19 GM/118ML RE ENEM: 1.0000 | ENEMA | Freq: Once | RECTAL | Status: DC | PRN

## 2019-02-12 MED ORDER — THROMBIN 20000 UNITS EX SOLR
CUTANEOUS | Status: AC
  Filled 2019-02-12: qty 20000

## 2019-02-12 MED ORDER — PHENOL 1.4 % MT LIQD
1.0000 | OROMUCOSAL | Status: DC | PRN
  Filled 2019-02-12: qty 177

## 2019-02-12 MED ORDER — BUPIVACAINE-EPINEPHRINE (PF) 0.5% -1:200000 IJ SOLN
INTRAMUSCULAR | Status: AC
  Filled 2019-02-12: qty 30

## 2019-02-12 MED ORDER — MIDAZOLAM HCL 5 MG/5ML IJ SOLN
INTRAMUSCULAR | Status: DC | PRN
  Administered 2019-02-12 (×4): 0.5 mg via INTRAVENOUS

## 2019-02-12 MED ORDER — DEXAMETHASONE SODIUM PHOSPHATE 10 MG/ML IJ SOLN
INTRAMUSCULAR | Status: AC
  Filled 2019-02-12: qty 1

## 2019-02-12 MED ORDER — ACETAMINOPHEN 500 MG PO TABS
ORAL_TABLET | ORAL | Status: AC
  Administered 2019-02-12: 1000 mg
  Filled 2019-02-12: qty 2

## 2019-02-12 MED ORDER — SODIUM CHLORIDE 0.9 % IV SOLN
INTRAVENOUS | Status: DC | PRN
  Administered 2019-02-12: 12:00:00 via INTRAVENOUS
  Administered 2019-02-12: 50 ug/min via INTRAVENOUS

## 2019-02-12 MED ORDER — ASPIRIN EC 81 MG PO TBEC
81.0000 mg | DELAYED_RELEASE_TABLET | Freq: Every day | ORAL | Status: DC
  Administered 2019-02-13 – 2019-02-15 (×3): 81 mg via ORAL
  Filled 2019-02-12 (×3): qty 1

## 2019-02-12 MED ORDER — SUCCINYLCHOLINE CHLORIDE 200 MG/10ML IV SOSY
PREFILLED_SYRINGE | INTRAVENOUS | Status: AC
  Filled 2019-02-12: qty 10

## 2019-02-12 MED ORDER — ATORVASTATIN CALCIUM 10 MG PO TABS
10.0000 mg | ORAL_TABLET | Freq: Every day | ORAL | Status: DC
  Administered 2019-02-13 – 2019-02-15 (×3): 10 mg via ORAL
  Filled 2019-02-12 (×3): qty 1

## 2019-02-12 MED ORDER — CEFAZOLIN SODIUM 1 G IJ SOLR
INTRAMUSCULAR | Status: AC
  Filled 2019-02-12: qty 20

## 2019-02-12 MED ORDER — DOCUSATE SODIUM 100 MG PO CAPS
100.0000 mg | ORAL_CAPSULE | Freq: Two times a day (BID) | ORAL | Status: DC
  Administered 2019-02-12 – 2019-02-15 (×6): 100 mg via ORAL
  Filled 2019-02-12 (×6): qty 1

## 2019-02-12 MED ORDER — LACTATED RINGERS IV SOLN
INTRAVENOUS | Status: DC | PRN
  Administered 2019-02-12 (×2): via INTRAVENOUS

## 2019-02-12 MED ORDER — HEMOSTATIC AGENTS (NO CHARGE) OPTIME
TOPICAL | Status: DC | PRN
  Administered 2019-02-12: 1

## 2019-02-12 MED ORDER — POLYETHYLENE GLYCOL 3350 17 G PO PACK
17.0000 g | PACK | Freq: Every day | ORAL | Status: DC | PRN
  Administered 2019-02-15: 17 g via ORAL
  Filled 2019-02-12: qty 1

## 2019-02-12 MED ORDER — PROPOFOL 10 MG/ML IV BOLUS
INTRAVENOUS | Status: AC
  Filled 2019-02-12: qty 40

## 2019-02-12 MED ORDER — ALUM & MAG HYDROXIDE-SIMETH 200-200-20 MG/5ML PO SUSP
30.0000 mL | Freq: Four times a day (QID) | ORAL | Status: DC | PRN
  Administered 2019-02-15: 08:00:00 30 mL via ORAL
  Filled 2019-02-12: qty 30

## 2019-02-12 MED ORDER — SUCCINYLCHOLINE CHLORIDE 20 MG/ML IJ SOLN
INTRAMUSCULAR | Status: DC | PRN
  Administered 2019-02-12: 120 mg via INTRAVENOUS

## 2019-02-12 MED ORDER — LIDOCAINE 2% (20 MG/ML) 5 ML SYRINGE: INTRAMUSCULAR | Status: DC | PRN

## 2019-02-12 MED ORDER — PROPOFOL 500 MG/50ML IV EMUL
INTRAVENOUS | Status: DC | PRN
  Administered 2019-02-12: 100 ug/kg/min via INTRAVENOUS
  Administered 2019-02-12: 50 ug/kg/min via INTRAVENOUS

## 2019-02-12 MED ORDER — PHENYLEPHRINE 40 MCG/ML (10ML) SYRINGE FOR IV PUSH (FOR BLOOD PRESSURE SUPPORT)
PREFILLED_SYRINGE | INTRAVENOUS | Status: AC
  Filled 2019-02-12: qty 30

## 2019-02-12 MED ORDER — LISINOPRIL 5 MG PO TABS
5.0000 mg | ORAL_TABLET | Freq: Two times a day (BID) | ORAL | Status: DC
  Administered 2019-02-12 – 2019-02-15 (×6): 5 mg via ORAL
  Filled 2019-02-12 (×6): qty 1

## 2019-02-12 MED ORDER — EPHEDRINE 5 MG/ML INJ
INTRAVENOUS | Status: AC
  Filled 2019-02-12: qty 10

## 2019-02-12 MED ORDER — CEFAZOLIN SODIUM-DEXTROSE 2-4 GM/100ML-% IV SOLN
2.0000 g | INTRAVENOUS | Status: AC
  Administered 2019-02-12 (×2): 2 g via INTRAVENOUS

## 2019-02-12 MED ORDER — OXYCODONE HCL ER 15 MG PO T12A
15.0000 mg | EXTENDED_RELEASE_TABLET | Freq: Two times a day (BID) | ORAL | Status: DC
  Administered 2019-02-12: 15 mg via ORAL
  Filled 2019-02-12: qty 1

## 2019-02-12 MED ORDER — FENTANYL CITRATE (PF) 100 MCG/2ML IJ SOLN
25.0000 ug | INTRAMUSCULAR | Status: DC | PRN
  Administered 2019-02-12 (×4): 25 ug via INTRAVENOUS

## 2019-02-12 MED ORDER — ACETAMINOPHEN 650 MG RE SUPP: 650.0000 mg | RECTAL | Status: DC | PRN

## 2019-02-12 MED ORDER — LIDOCAINE 2% (20 MG/ML) 5 ML SYRINGE
INTRAMUSCULAR | Status: DC | PRN
  Administered 2019-02-12: 60 mg via INTRAVENOUS

## 2019-02-12 MED ORDER — ONDANSETRON HCL 4 MG/2ML IJ SOLN: 4.0000 mg | Freq: Once | INTRAMUSCULAR | Status: DC | PRN

## 2019-02-12 MED ORDER — ACETAMINOPHEN 500 MG PO TABS: 1000.0000 mg | ORAL_TABLET | Freq: Once | ORAL | Status: DC

## 2019-02-12 MED ORDER — GABAPENTIN 300 MG PO CAPS
300.0000 mg | ORAL_CAPSULE | Freq: Two times a day (BID) | ORAL | Status: DC
  Administered 2019-02-12 – 2019-02-15 (×6): 300 mg via ORAL
  Filled 2019-02-12 (×6): qty 1

## 2019-02-12 MED ORDER — LORAZEPAM 0.5 MG PO TABS
0.5000 mg | ORAL_TABLET | Freq: Four times a day (QID) | ORAL | Status: DC | PRN
  Administered 2019-02-13: 0.5 mg via ORAL
  Filled 2019-02-12: qty 1

## 2019-02-12 MED ORDER — ONDANSETRON HCL 4 MG/2ML IJ SOLN
INTRAMUSCULAR | Status: AC
  Filled 2019-02-12: qty 2

## 2019-02-12 MED ORDER — LIDOCAINE 2% (20 MG/ML) 5 ML SYRINGE
INTRAMUSCULAR | Status: AC
  Filled 2019-02-12: qty 5

## 2019-02-12 MED ORDER — DEXAMETHASONE SODIUM PHOSPHATE 10 MG/ML IJ SOLN
INTRAMUSCULAR | Status: DC | PRN
  Administered 2019-02-12: 10 mg via INTRAVENOUS

## 2019-02-12 MED ORDER — CELECOXIB 200 MG PO CAPS
200.0000 mg | ORAL_CAPSULE | Freq: Every day | ORAL | Status: DC
  Administered 2019-02-13 – 2019-02-15 (×3): 200 mg via ORAL
  Filled 2019-02-12 (×3): qty 1

## 2019-02-12 MED ORDER — ALBUMIN HUMAN 5 % IV SOLN
INTRAVENOUS | Status: DC | PRN
  Administered 2019-02-12 (×4): via INTRAVENOUS

## 2019-02-12 MED ORDER — ROCURONIUM BROMIDE 10 MG/ML (PF) SYRINGE
PREFILLED_SYRINGE | INTRAVENOUS | Status: AC
  Filled 2019-02-12: qty 10

## 2019-02-12 MED ORDER — MORPHINE SULFATE (PF) 2 MG/ML IV SOLN
1.0000 mg | INTRAVENOUS | Status: DC | PRN
  Administered 2019-02-12: 22:00:00 1 mg via INTRAVENOUS
  Filled 2019-02-12: qty 1

## 2019-02-12 MED ORDER — EPHEDRINE SULFATE 50 MG/ML IJ SOLN
INTRAMUSCULAR | Status: DC | PRN
  Administered 2019-02-12 (×3): 10 mg via INTRAVENOUS
  Administered 2019-02-12: 15 mg via INTRAVENOUS
  Administered 2019-02-12 (×4): 10 mg via INTRAVENOUS

## 2019-02-12 MED ORDER — PHENYLEPHRINE HCL (PRESSORS) 10 MG/ML IV SOLN
INTRAVENOUS | Status: DC | PRN
  Administered 2019-02-12: 120 ug via INTRAVENOUS
  Administered 2019-02-12: 40 ug via INTRAVENOUS
  Administered 2019-02-12: 80 ug via INTRAVENOUS
  Administered 2019-02-12: 50 ug via INTRAVENOUS
  Administered 2019-02-12: 80 ug via INTRAVENOUS
  Administered 2019-02-12: 120 ug via INTRAVENOUS
  Administered 2019-02-12 (×3): 80 ug via INTRAVENOUS
  Administered 2019-02-12: 50 ug via INTRAVENOUS
  Administered 2019-02-12 (×3): 80 ug via INTRAVENOUS
  Administered 2019-02-12: 40 ug via INTRAVENOUS

## 2019-02-12 MED ORDER — ACETAMINOPHEN 325 MG PO TABS
650.0000 mg | ORAL_TABLET | ORAL | Status: DC | PRN
  Administered 2019-02-13: 650 mg via ORAL
  Filled 2019-02-12: qty 2

## 2019-02-12 MED ORDER — VENLAFAXINE HCL ER 150 MG PO CP24
150.0000 mg | ORAL_CAPSULE | Freq: Every day | ORAL | Status: DC
  Administered 2019-02-13 – 2019-02-15 (×3): 150 mg via ORAL
  Filled 2019-02-12 (×3): qty 1

## 2019-02-12 MED ORDER — METHOCARBAMOL 1000 MG/10ML IJ SOLN
500.0000 mg | Freq: Four times a day (QID) | INTRAVENOUS | Status: DC | PRN
  Filled 2019-02-12: qty 5

## 2019-02-12 MED ORDER — MIDAZOLAM HCL 2 MG/2ML IJ SOLN
INTRAMUSCULAR | Status: AC
  Filled 2019-02-12: qty 2

## 2019-02-12 MED ORDER — CEFAZOLIN SODIUM-DEXTROSE 2-4 GM/100ML-% IV SOLN
INTRAVENOUS | Status: AC
  Filled 2019-02-12: qty 100

## 2019-02-12 MED ORDER — SODIUM CHLORIDE 0.9 % IV SOLN
INTRAVENOUS | Status: DC | PRN
  Administered 2019-02-12: 12:00:00 via INTRAVENOUS

## 2019-02-12 MED ORDER — CEFAZOLIN SODIUM-DEXTROSE 2-4 GM/100ML-% IV SOLN
2.0000 g | Freq: Three times a day (TID) | INTRAVENOUS | Status: AC
  Administered 2019-02-12 – 2019-02-13 (×2): 2 g via INTRAVENOUS
  Filled 2019-02-12 (×2): qty 100

## 2019-02-12 MED ORDER — SODIUM CHLORIDE 0.9 % IV SOLN: INTRAVENOUS | Status: DC

## 2019-02-12 MED ORDER — CHLORHEXIDINE GLUCONATE 4 % EX LIQD: 60.0000 mL | Freq: Once | CUTANEOUS | Status: DC

## 2019-02-12 MED ORDER — FENTANYL CITRATE (PF) 250 MCG/5ML IJ SOLN
INTRAMUSCULAR | Status: AC
  Filled 2019-02-12: qty 5

## 2019-02-12 MED ORDER — ROCURONIUM BROMIDE 50 MG/5ML IV SOSY
PREFILLED_SYRINGE | INTRAVENOUS | Status: DC | PRN
  Administered 2019-02-12: 20 mg via INTRAVENOUS
  Administered 2019-02-12: 30 mg via INTRAVENOUS

## 2019-02-12 MED ORDER — FENTANYL CITRATE (PF) 100 MCG/2ML IJ SOLN
INTRAMUSCULAR | Status: DC | PRN
  Administered 2019-02-12 (×4): 50 ug via INTRAVENOUS
  Administered 2019-02-12: 100 ug via INTRAVENOUS
  Administered 2019-02-12: 50 ug via INTRAVENOUS
  Administered 2019-02-12: 100 ug via INTRAVENOUS

## 2019-02-12 MED ORDER — BUPIVACAINE-EPINEPHRINE 0.5% -1:200000 IJ SOLN
INTRAMUSCULAR | Status: DC | PRN
  Administered 2019-02-12: 10 mL

## 2019-02-12 MED ORDER — LACTATED RINGERS IV SOLN
INTRAVENOUS | Status: DC | PRN
  Administered 2019-02-12 (×2): via INTRAVENOUS

## 2019-02-12 MED ORDER — PANTOPRAZOLE SODIUM 40 MG IV SOLR
40.0000 mg | Freq: Every day | INTRAVENOUS | Status: DC
  Administered 2019-02-12: 40 mg via INTRAVENOUS
  Filled 2019-02-12: qty 40

## 2019-02-12 MED ORDER — ONDANSETRON HCL 4 MG/2ML IJ SOLN
INTRAMUSCULAR | Status: DC | PRN
  Administered 2019-02-12: 4 mg via INTRAVENOUS

## 2019-02-12 MED ORDER — BISACODYL 5 MG PO TBEC: 5.0000 mg | DELAYED_RELEASE_TABLET | Freq: Every day | ORAL | Status: DC | PRN

## 2019-02-12 MED ORDER — SODIUM CHLORIDE 0.9 % IV SOLN: 250.0000 mL | INTRAVENOUS | Status: DC

## 2019-02-12 MED ORDER — TAMSULOSIN HCL 0.4 MG PO CAPS
0.8000 mg | ORAL_CAPSULE | Freq: Every day | ORAL | Status: DC
  Administered 2019-02-13 – 2019-02-15 (×3): 0.8 mg via ORAL
  Filled 2019-02-12 (×3): qty 2

## 2019-02-12 MED ORDER — FAMOTIDINE 40 MG PO TABS
40.0000 mg | ORAL_TABLET | Freq: Every day | ORAL | Status: DC
  Administered 2019-02-12 – 2019-02-14 (×3): 40 mg via ORAL
  Filled 2019-02-12 (×4): qty 1

## 2019-02-12 MED ORDER — HYDROMORPHONE HCL 1 MG/ML IJ SOLN
0.5000 mg | INTRAMUSCULAR | Status: AC | PRN
  Administered 2019-02-12 – 2019-02-13 (×3): 0.5 mg via INTRAVENOUS
  Filled 2019-02-12 (×5): qty 1

## 2019-02-12 MED ORDER — TESTOSTERONE CYPIONATE 200 MG/ML IM SOLN: 200.0000 mg | INTRAMUSCULAR | Status: DC

## 2019-02-12 MED ORDER — FENTANYL CITRATE (PF) 100 MCG/2ML IJ SOLN
INTRAMUSCULAR | Status: AC
  Filled 2019-02-12: qty 2

## 2019-02-12 MED ORDER — PHENYLEPHRINE 40 MCG/ML (10ML) SYRINGE FOR IV PUSH (FOR BLOOD PRESSURE SUPPORT)
PREFILLED_SYRINGE | INTRAVENOUS | Status: AC
  Filled 2019-02-12: qty 10

## 2019-02-12 MED ORDER — 0.9 % SODIUM CHLORIDE (POUR BTL) OPTIME
TOPICAL | Status: DC | PRN
  Administered 2019-02-12 (×3): 1000 mL

## 2019-02-12 MED ORDER — INSULIN ASPART 100 UNIT/ML ~~LOC~~ SOLN
0.0000 [IU] | Freq: Three times a day (TID) | SUBCUTANEOUS | Status: DC
  Administered 2019-02-13 – 2019-02-14 (×3): 3 [IU] via SUBCUTANEOUS
  Administered 2019-02-14 – 2019-02-15 (×2): 2 [IU] via SUBCUTANEOUS

## 2019-02-12 MED ORDER — EPHEDRINE 5 MG/ML INJ
INTRAVENOUS | Status: AC
  Filled 2019-02-12: qty 20

## 2019-02-12 MED ORDER — LORATADINE 10 MG PO TABS
10.0000 mg | ORAL_TABLET | Freq: Every day | ORAL | Status: DC
  Administered 2019-02-13 – 2019-02-15 (×3): 10 mg via ORAL
  Filled 2019-02-12 (×3): qty 1

## 2019-02-12 MED ORDER — OXYCODONE HCL 5 MG PO TABS
10.0000 mg | ORAL_TABLET | ORAL | Status: DC | PRN
  Administered 2019-02-12 – 2019-02-14 (×6): 10 mg via ORAL
  Filled 2019-02-12 (×6): qty 2

## 2019-02-12 MED ORDER — MENTHOL 3 MG MT LOZG
1.0000 | LOZENGE | OROMUCOSAL | Status: DC | PRN
  Administered 2019-02-13: 3 mg via ORAL
  Filled 2019-02-12: qty 9

## 2019-02-12 MED ORDER — ONDANSETRON HCL 4 MG PO TABS: 4.0000 mg | ORAL_TABLET | Freq: Four times a day (QID) | ORAL | Status: DC | PRN

## 2019-02-12 MED ORDER — METHOCARBAMOL 500 MG PO TABS
500.0000 mg | ORAL_TABLET | Freq: Four times a day (QID) | ORAL | Status: DC | PRN
  Administered 2019-02-12 – 2019-02-15 (×10): 500 mg via ORAL
  Filled 2019-02-12 (×10): qty 1

## 2019-02-12 MED ORDER — SODIUM CHLORIDE 0.9% FLUSH: 3.0000 mL | INTRAVENOUS | Status: DC | PRN

## 2019-02-12 MED ORDER — OXYCODONE HCL 5 MG PO TABS
5.0000 mg | ORAL_TABLET | ORAL | Status: DC | PRN
  Administered 2019-02-14: 22:00:00 5 mg via ORAL
  Filled 2019-02-12: qty 1

## 2019-02-12 MED ORDER — SODIUM CHLORIDE 0.9% FLUSH
3.0000 mL | Freq: Two times a day (BID) | INTRAVENOUS | Status: DC
  Administered 2019-02-12 – 2019-02-15 (×5): 3 mL via INTRAVENOUS

## 2019-02-12 MED ORDER — PROPOFOL 10 MG/ML IV BOLUS
INTRAVENOUS | Status: DC | PRN
  Administered 2019-02-12: 50 mg via INTRAVENOUS
  Administered 2019-02-12: 150 mg via INTRAVENOUS

## 2019-02-12 SURGICAL SUPPLY — 97 items
ADH SKN CLS APL DERMABOND .7 (GAUZE/BANDAGES/DRESSINGS) ×1
AGENT HMST KT MTR STRL THRMB (HEMOSTASIS) ×1
BIT DRILL NEURO 2X3.1 SFT TUCH (MISCELLANEOUS) IMPLANT
BLADE CLIPPER SURG (BLADE) IMPLANT
BLADE SURG 15 STRL LF DISP TIS (BLADE) IMPLANT
BLADE SURG 15 STRL SS (BLADE)
BONE CANC CHIPS 20CC PCAN1/4 (Bone Implant) ×2 IMPLANT
BONE VIVIGEN FORMABLE 10CC (Bone Implant) ×2 IMPLANT
BUR RND FLUTED 2.5 (BURR) IMPLANT
BUR SABER RD CUTTING 3.0 (BURR) IMPLANT
BUR SURG 4X8 MED (BURR) IMPLANT
BURR SURG 4X8 MED (BURR)
CHIPS CANC BONE 20CC PCAN1/4 (Bone Implant) ×1 IMPLANT
CONNECTOR EXPEDIUM TI 55MM (Connector) ×2 IMPLANT
CORD BIPOLAR FORCEPS 12FT (ELECTRODE) ×2 IMPLANT
COVER BACK TABLE 80X110 HD (DRAPES) ×2 IMPLANT
COVER MAYO STAND STRL (DRAPES) ×1 IMPLANT
COVER SURGICAL LIGHT HANDLE (MISCELLANEOUS) ×2 IMPLANT
COVER WAND RF STERILE (DRAPES) ×1 IMPLANT
CROSSLINK EXPEDIUM SFX A1 (Neuro Prosthesis/Implant) ×1 IMPLANT
DERMABOND ADVANCED (GAUZE/BANDAGES/DRESSINGS) ×1
DERMABOND ADVANCED .7 DNX12 (GAUZE/BANDAGES/DRESSINGS) ×1 IMPLANT
DRAPE C-ARM 42X72 X-RAY (DRAPES) ×1 IMPLANT
DRAPE C-ARMOR (DRAPES) ×1 IMPLANT
DRAPE HALF SHEET 40X57 (DRAPES) ×4 IMPLANT
DRAPE INCISE IOBAN 66X45 STRL (DRAPES) ×2 IMPLANT
DRAPE MICROSCOPE LEICA (MISCELLANEOUS) ×1 IMPLANT
DRAPE SURG 17X23 STRL (DRAPES) ×8 IMPLANT
DRILL NEURO 2X3.1 SOFT TOUCH (MISCELLANEOUS) ×2
DRSG MEPILEX BORDER 4X12 (GAUZE/BANDAGES/DRESSINGS) ×1 IMPLANT
DRSG MEPILEX BORDER 4X4 (GAUZE/BANDAGES/DRESSINGS) IMPLANT
DRSG MEPILEX BORDER 4X8 (GAUZE/BANDAGES/DRESSINGS) ×1 IMPLANT
DRSG PAD ABDOMINAL 8X10 ST (GAUZE/BANDAGES/DRESSINGS) ×2 IMPLANT
DURAPREP 26ML APPLICATOR (WOUND CARE) ×2 IMPLANT
ELECT BLADE 4.0 EZ CLEAN MEGAD (MISCELLANEOUS) ×2
ELECT BLADE 6.5 EXT (BLADE) IMPLANT
ELECT CAUTERY BLADE 6.4 (BLADE) ×2 IMPLANT
ELECT REM PT RETURN 9FT ADLT (ELECTROSURGICAL) ×2
ELECTRODE BLDE 4.0 EZ CLN MEGD (MISCELLANEOUS) IMPLANT
ELECTRODE REM PT RTRN 9FT ADLT (ELECTROSURGICAL) ×1 IMPLANT
EVACUATOR 1/8 PVC DRAIN (DRAIN) IMPLANT
FEE INTRAOP MONITOR IMPULS NCS (MISCELLANEOUS) IMPLANT
GAUZE SPONGE 4X4 12PLY STRL (GAUZE/BANDAGES/DRESSINGS) ×2 IMPLANT
GLOVE BIOGEL PI IND STRL 8 (GLOVE) ×1 IMPLANT
GLOVE BIOGEL PI INDICATOR 8 (GLOVE) ×2
GLOVE ECLIPSE 9.0 STRL (GLOVE) ×3 IMPLANT
GLOVE ORTHO TXT STRL SZ7.5 (GLOVE) ×3 IMPLANT
GLOVE SURG 8.5 LATEX PF (GLOVE) ×3 IMPLANT
GOWN STRL REUS W/ TWL LRG LVL3 (GOWN DISPOSABLE) ×1 IMPLANT
GOWN STRL REUS W/TWL 2XL LVL3 (GOWN DISPOSABLE) ×4 IMPLANT
GOWN STRL REUS W/TWL LRG LVL3 (GOWN DISPOSABLE) ×2
GRAFT BNE CANC CHIPS 1-8 20CC (Bone Implant) IMPLANT
GRAFT BNE MATRIX VG FRMBL L 10 (Bone Implant) IMPLANT
HEMOSTAT SURGICEL 2X14 (HEMOSTASIS) IMPLANT
INTRAOP MONITOR FEE IMPULS NCS (MISCELLANEOUS) ×1
INTRAOP MONITOR FEE IMPULSE (MISCELLANEOUS) ×1
IV CATH 14GX2 1/4 (CATHETERS) ×1 IMPLANT
KIT BASIN OR (CUSTOM PROCEDURE TRAY) ×2 IMPLANT
KIT TURNOVER KIT B (KITS) ×2 IMPLANT
MANIFOLD NEPTUNE II (INSTRUMENTS) ×2 IMPLANT
NEEDLE 22X1 1/2 (OR ONLY) (NEEDLE) ×2 IMPLANT
NS IRRIG 1000ML POUR BTL (IV SOLUTION) ×4 IMPLANT
PACK LAMINECTOMY ORTHO (CUSTOM PROCEDURE TRAY) ×2 IMPLANT
PAD ARMBOARD 7.5X6 YLW CONV (MISCELLANEOUS) ×4 IMPLANT
PATTIES SURGICAL .75X.75 (GAUZE/BANDAGES/DRESSINGS) ×1 IMPLANT
PROBE PEDCLE PROBE MAGSTM DISP (MISCELLANEOUS) ×1 IMPLANT
ROD EXPEDIUM 5.5X300MM (Rod) ×2 IMPLANT
SCREW CORT FIX FEN 5.5X5X35MM (Screw) ×1 IMPLANT
SCREW CORT FIX FEN 5.5X5X40MM (Screw) ×1 IMPLANT
SCREW CORT FIX FEN 5.5X5X45MM (Screw) ×2 IMPLANT
SCREW CORT FIX FEN 5.5X6X35MM (Screw) ×1 IMPLANT
SCREW CORT FIX FEN 5.5X6X40MM (Screw) ×5 IMPLANT
SCREW CORT FIX FEN 5.5X6X45MM (Screw) ×4 IMPLANT
SCREW SET SINGLE INNER (Screw) ×10 IMPLANT
SOLUTION ANTI FOG 6CC (MISCELLANEOUS) ×1 IMPLANT
SPONGE LAP 4X18 RFD (DISPOSABLE) ×7 IMPLANT
SPONGE SURGIFOAM ABS GEL 100 (HEMOSTASIS) ×1 IMPLANT
STAPLER VISISTAT 35W (STAPLE) ×2 IMPLANT
STYLET INTUB SATIN SLIP 14FR (MISCELLANEOUS) ×1 IMPLANT
SURGIFLO W/THROMBIN 8M KIT (HEMOSTASIS) ×1 IMPLANT
SUT BONE WAX W31G (SUTURE) IMPLANT
SUT VIC AB 0 CT1 27 (SUTURE) ×4
SUT VIC AB 0 CT1 27XBRD ANBCTR (SUTURE) ×1 IMPLANT
SUT VIC AB 1 CTX 36 (SUTURE) ×6
SUT VIC AB 1 CTX36XBRD ANBCTR (SUTURE) ×2 IMPLANT
SUT VIC AB 2-0 CT1 27 (SUTURE) ×4
SUT VIC AB 2-0 CT1 TAPERPNT 27 (SUTURE) ×1 IMPLANT
SUT VICRYL 4-0 PS2 18IN ABS (SUTURE) ×4 IMPLANT
SYR CONTROL 10ML LL (SYRINGE) ×2 IMPLANT
TAP CANN VIPER2 DL 5.0 (TAP) ×1 IMPLANT
TAP CANN VIPER2 DL 6.0 (TAP) ×1 IMPLANT
TAP VIPER MIS 4.35MM (TAP) ×1 IMPLANT
TOWEL GREEN STERILE (TOWEL DISPOSABLE) ×2 IMPLANT
TOWEL GREEN STERILE FF (TOWEL DISPOSABLE) ×2 IMPLANT
TRAY FOLEY MTR SLVR 16FR STAT (SET/KITS/TRAYS/PACK) ×1 IMPLANT
WATER STERILE IRR 1000ML POUR (IV SOLUTION) ×2 IMPLANT
YANKAUER SUCT BULB TIP NO VENT (SUCTIONS) ×2 IMPLANT

## 2019-02-12 NOTE — Op Note (Addendum)
02/12/2019  8:01 PM  PATIENT:  Luis Brown  73 y.o. 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, proximal junctional kyphosis above Thoracic Ten-Sacral One fusion, degenerative disc disease Thoracic Eight-Nine, Thoracic Nine-Ten  PROCEDURE:  Procedure(s): DECOMPRESSIVE LAMINECTOMY THORACIC NINE-THORACIC TEN AND THORACIC TEN-THORACIC ELEVEN, EXTENSION OF THORACOLUMBAR FUSION FROM THORACIC TEN TO THORACIC FIVE, LOCAL BONE GRAFT, ALLOGRAFT AND VIVIGEN    SURGEON:  Jessy Oto, MD     ASSISTANT:  Benjiman Core, PA-C  (Present throughout the entire procedure and necessary for completion of procedure in a timely manner)     ANESTHESIA:  General, supplemented with local anesthesia marcaine 0.5% 1:1 exparel 1.3% total 30cc, Ebbie Latus CRNA    COMPLICATIONS:  None.   EBL: 900CC  CELL SAVER BLOOD RETURNED: 300CC  DRAINS: Foley to SD.    COMPONENTS:  Implant Name Type Inv. Item Serial No. Manufacturer Lot No. LRB No. Used Action  SCREW VIPER 8X45MM - [id number] Screw SCREW VIPER 8X45MM  [employer]  N/A 2 Explanted  SCREW SET SINGLE INNER - [id number] Screw SCREW SET SINGLE INNER  [employer]  N/A 2 Explanted  BONE VIVIGEN FORMABLE 10CC - [id number] Bone Implant BONE VIVIGEN FORMABLE 10CC [id number] [employer]  N/A 1 Implanted  SCREW CORT FIX FEN 5.5X6X40MM - [id number] Screw SCREW CORT FIX FEN 5.5X6X40MM  [employer]  N/A 4 Implanted  SCREW CORT FIX FEN 5.5X6X45MM - [id number] Screw SCREW CORT FIX FEN 5.5X6X45MM  [employer]  N/A 3 Implanted  ROD  EXPEDIUM 5.5X300MM - [id number] Rod ROD EXPEDIUM 5.5X300MM  [employer]  N/A 2 Implanted  SCREW SET SINGLE INNER - [id number] Screw SCREW SET SINGLE INNER  [employer]  N/A 10 Implanted  SCREW CORT FIX FEN 5.5X5X45MM - [id number] Screw SCREW CORT FIX FEN 5.5X5X45MM  [employer]  N/A 1 Implanted  CONNECTOR EXPEDIUM TI 55MM - [id number] Connector CONNECTOR EXPEDIUM TI 55MM  [employer]  N/A 2 Implanted  BONE CANC CHIPS 20CC - [id number] Bone Implant BONE CANC CHIPS 20CC [id number] [employer]  N/A 1 Implanted  SCREW CORT FIX FEN 5.5X6X40MM - [id number] Screw SCREW CORT FIX FEN 5.5X6X40MM  [employer]  N/A 1 Implanted and Explanted  SCREW CORT FIX FEN 5.5X6X45MM - [id number] Screw SCREW CORT FIX FEN 5.5X6X45MM  [employer]  N/A 1 Implanted and Explanted  SCREW CORT FIX FEN 5.5X5X45MM - [id number] Screw SCREW CORT FIX FEN 5.5X5X45MM  [employer]  N/A 1 Implanted and Explanted  SCREW CORT FIX FEN 5.5X5X40MM - [id number] Screw SCREW CORT FIX FEN 5.5X5X40MM  [employer]  N/A 1 Implanted and Explanted  SCREW CORT FIX FEN 5.5X6X35MM - [id number] Screw SCREW CORT FIX FEN 5.5X6X35MM  [employer]  N/A 1 Implanted  SCREW CORT FIX FEN 5.5X5X35MM - [id number] Screw SCREW CORT FIX FEN 5.5X5X35MM  [employer]  N/A 1 Implanted  CROSSLINK EXPEDIUM SFX A1 - [id number] Neuro Prosthesis/Implant CROSSLINK EXPEDIUM SFX A1  [employer]  N/A 1 Implanted     INTRA NEUROMONITORING STUDIES: T5   Left  15  Right  15 T6   Left  8   Right  13 T7   Left  6   Right   7 T8   Left  9   Right   8 T9   Left ND  RIght   ND No intraoperative neuromonitoring changes noted during the entire case including both sensory evoked potentials and motor evoked potentials.      PROCEDURE: The patient was met in the holding area, as the surgery planned is midline structures T5 to  T10 for extension of the fusion, I placed my initials were placed on the skin, intervention is planned for T9-10 and T10-11 for central decompression for thoracic stenosis. The patient was then transported to OR. The patient was then placed under  general anesthesia without difficulty. The patient received appropriate preoperative antibiotic prophylaxis ancef 2 grams.Nursing staff inserted a Foley catheter under sterile conditions. Patient was then transferred to the [facility] spine table and frame, prone. All pressure points were well padded, arms at the side 90-90 at shoulder and elbow. PAS applied to both legs. Standard prep with duraprep from the Upper thoracic spine T4 to the L3 level. Intraoperative neuromonitoring was set up and electrode leads were in place for this patient.  Draped in the usual manner, iodine vi-drape used. Time-out procedure was called and correct. The old incision scar was marked out and infiltrated with Marcaine 0.5% 1:1 Exparel 1.3% total of 40 cc used. Initial incision made at the T4 level and carried down to the T12 level in the midline. Through skin and subcutaneous layers using knife 10 blade scalpel. Electrocautery then used to divide the deeper layers to the spinous processes from T4-T12. Cobb was then used to elevate the dorsal muscles exposing the previous rods and screws at the T10 through T12 levels. Further exposure carried laterally at the T10-11 level elevating the dorsal muscles out to the transverse process articulation with ribs. Cerebellar retractors were inserted cranially and Beckman retractor distally. Soft tissue carefully debrided off the posterior aspect of the patient's posterior elements from T5-T11. The rod at the T10-11 level was exposed with Cobb elevators and electrocautery. The pedicle fastener caps at T10 were removed bilaterally and the rod at the upper aspect of the space between the T10 and T11 fastener then divided with a bolt cutter. The pedicle  screws at T10 were then removed following the removal of the rod at the T10 level. C arm fluoro used to inspect of each of the thoracic pedicles to allow for safe pedicle screw placement.The C-arm sterilely draped and brought into the field. 3 mm Kerrisons used to debride ligamentum flavum at each level and resect lateral until the medial and superior aspect of the pedicle could be palpated with blunt nerve hook. Decision was made to remove hardware left side that was extra pedicular and in close proximity to the great vessels. These areas were carefully exposed using Cobb elevator along the left side extending from T5-T11. Incision was extended cranially to accommodate mPACT pedicle screws were then placed. C-arm was sterilely draped and brought into the field and an AP position alignment with the superior endplate of T9. Using a 38m drill the initial entry into the posterior pedicle was placed. A thoracic pedicle probe then carefully aligned along the left mid lateral aspect of the T9 pedicle at the 9:00 position. A small depression made with the tip of the thoracic pedicle probe and then lateral projection obtain using C-arm to determine the  correct alignment in the lateral plane extending from the inferior aspect of the pedicle coursing through the pedicle towards the posterior half of the posterior lateral aspect of the vertebral body and subchondral bone beneath the superior endplate of T9. The thoracic pedicle probe was then removed. Ball tip probe used to probe the opening into the posterior aspect of the pedicle. Probe then all 4 quadrants and the base of the opening no sign of cortical broach. Tapping was then carried out with a 4.35 mm then a 5 mm tap and then a 6 mm tap. A 40 mm in length cortical trajectory screw was chosen by 6 mm. This was then placed on the table for later placement left side at T9 following the laminectomy portion of this case. Using a similar technique then on the right side  aligning the C-arm placing an initial drill opening then a thoracic pedicle probe at the 9:00 position over the posterior aspect of the pedicle of opening was made on the right side. This was then also tapped with 4.35 mm tap, 5 mm tap and 63m tap. A 40 mm x 6 mm cortical trajectory screw placed on the table for later placement on the right side after the laminectomy portion of the case. At the T8  level bilateral 6.079mx 45 mm pedicle screw placed on the left and a 40 mm x 6 mm was placed on the right side. This procedure was repeated then successive levels, at the T9, T8, T7, T6 and T5 levels. At the T5 levels the pedicle probe was found to be narrow in both the medial lateral and superior inferior diameters so that a 5.0 mm width screw by 45 mm was used bilaterally at this level. Each of the screw holes were carefully palpated at each level and determined to show no signs broaching of cortical bone within the pedicles and within the posterior half of the vertebral bodies using a ball-tipped probe.Each of the pedicle screws were then tested for impedance and the T9 screws left out until the laminectomy portion of the case were not measured. Attention then turned to performing the central decompressive laminectomy from T9 to T11. The spinous processes of T9 and T10 and the residual posterior elements at T11 were resected with a leskell ronguer, the lower 50% of the T8 spinous process was Resected. A fluted round 3 mm burr was used to remove the central portions of the lamina and central posterior elements from the T8-9 to T10-11 thinning lamina and retaining the  Ligamentum flavum [id number] used to resect the central portions of the lamina then 3 mm kerrisons to remove lamina more laterally. The OR microscope with draped sterilely and brought into the field for this portion of the case. The remaining ligamentum flavum then carefully resected removing bone off the medial superior articular processes and  allowing for removal of the ligamentum. There was significant hypertrophic ligamentum Flavum impressing on the lateral and posterior thecal sac at T8-9, T9-10 and  T10-11. The thecal sac had normal pulsation and no areas of compression. Bone wax was  Used to obtain hemostasis along the laminectomy lateral bone borders. Gelfoam and cottonoids then used to protect the central laminectomy area. The pedicle screws were then placed at T9 bilaterally. Attention turned to placement of bilateral rods T5 to T11. Connection sleeves were then placed on the old retained rods at the space above the T11 pedicle screw fasteners. The sleeve hook attachment to the retained rods tightened to  provide provisional fixation. The left sided interrod sleeves were fastened to the left sided retained rods in similar fashion but positioned closer together to prevent the sleeves from interfering with the sleeves on the opposite side.   5.5 mm titanium rods were then carefully contoured to the screw fasteners extending from T5 to T11 on the left side. On the right side the rod was contoured from T5 to T11 Soft tissue about the retained rods on the right and left side between the fasteners at T11 and T12 was debrided to allow expedient rods. After carefully some bone was removed from the spinous processes at the level for placemen for placement of a 5.5 mm rod sleeves for connecting the retained rod and screws to the 5.5 mm titanium of the sleeves centrally. Rods were then carefully placed in the right sided rod connector sleeves and reduced into the right side pedicle screw fasteners extending from T5 to T9,The sleeves were then carefully attached to the rod, seated firmly and then screwed into place torque screwdriver was then used to torque these sleeves to 80 foot-pounds. The sleeves were then tightened to the titanium rod. Each of the fastener caps placed over the cortical trajectory screw fasteners from T5 to T11, placed without  difficulty. These were then torqued to 80 foot-pounds. On the left side the 5.5 mm titanium rod was contoured to the pedicle screw fasteners extending from T5-T10. The contoured rod was then placed within the caudle inter rod sleeves and the T-9 pedicle fasteners and the fastener caps applied. Fastener caps were then torqued to 80 foot-pounds extending from T6-T10. Intraoperative C-arm fluoroscopy permanent images were obtained in both AP and lateral planes documenting the instrumentation extending from T6-T10. Irrigation with copious amounts of irrigant solution was then carried down over the mid line incision. Each of the bilateral facets  Were decorticated with osteotomes and high speed burr at T5-6, T6-7, T7-8, T8-9 and T9-10. The decorticated facets packed with local bone graft, vivigen and allograft cancellous chips. Further decortication of the remaining lateral spinous processes T5 through T9 was carried out bilaterally and these areas packed with cancellous allograft chips. C-arm imaging demonstrated the pedicle screws on the left T5 and T7 were long and  These were replaced twice with a length of 35 mm eventually placed at each level, 55m width at T5 and 6 mm at T7 on the left. An A1 crosslink was placed at the rod between the fasteners of T8 and T9. No screws exchanged on the right side. After copious irrigation and debridement of any devitalized tissue. The patient's deep fascial layers approximating the patient's lumbodorsal fascia to the gluteal fascia then interrupted #1 Vicryl sutures. Subcutaneous layers approximated with interrupted #1 Vicryl sutures subcutaneous superficial layers with interrupted 2-0 Vicryl sutures and the skin closed with a running subcutaneous stitch of 4-0 Vicryl. Patient's midline incision dorsal spine and extending from T4 to T12 bilaterally then irrigated with copious amounts of irrigant solution. Decortication was then carried down to the lateral aspect of the spinous  processes extending from T5-T8 and along the posterior aspect of the lamina bilaterally from T5-T10 and then over the lateral aspect of the lamina and transverse processes and the from T5-T10. Local bone graft that had been harvested used to carefully applied to the posterior aspect of the lamina extending from T5-T10 bilaterally additionally bone graft was then collected at the time of the patient's central laminotomies was also applied. Gelfoam was left within the laminotomy sites at  each level in order to prevent bone graft from being placed within the laminotomy areas. With this then the examination incision demonstrated no active bleeding present. Patient's thoracolumbar fascia was then reapproximated to the spinous processes in the midline with interrupted #1 Vicryl sutures. The deep subcutaneous layers approximated with interrupted 0 Vicryl sutures more superficial layers with interrupted 2-0 Vicryl sutures skin closed with stainless steel staples.  Cell Saver was used during the case the patient's estimated blood loss was 900 cc. 300CC Cell Saver blood was available to reinfuse. Dressings were then applied to the thoracic incision using Mepilex dressing. Patient was then returned to a supine position on his hospital bed, he was then reactivated extubated and returned to the recovery room in satisfactory condition all instrument and sponge counts were correct.  Benjiman Core PA-C perform the duties of assistant surgeon during this case. He was present from the beginning of the case to the end of the case assisting in transfer the patient from his stretcher to the OR table and back to the stretcher at the end of the case. Assisted in careful retraction and suction of the laminectomy site delicate neural structures operating under the OR microscope. He performed closure of the incision from the fascia to the skin applying the dressing.         02/12/2019,

## 2019-02-12 NOTE — Brief Op Note (Signed)
02/12/2019  3:27 PM  PATIENT:  Luis Brown  73 y.o. 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, proximal junctional kyphosis above Thoracic Ten-Sacral One fusion, degenerative disc disease Thoracic Eight-Nine, Thoracic Nine-Ten  PROCEDURE:  Procedure(s) with comments: DECOMPRESSIVE LAMINECTOMY THORACIC NINE-THORACIC TEN AND THORACIC TEN-THORACIC ELEVEN, EXTENSION OF THORACOLUMBAR FUSION FROM THORACIC TEN TO THORACIC FIVE, LOCAL BONE GRAFT, ALLOGRAFT AND VIVIGEN (N/A) - DECOMPRESSIVE LAMINECTOMY THORACIC NINE-THORACIC TEN AND THORACIC TEN-THORACIC ELEVEN, EXTENSION OF THORACOLUMBAR FUSION FROM THORACIC TEN TO THORACIC FIVE, LOCAL BONE GRAFT, ALLOGRAFT AND VIVIGEN  SURGEON:  Surgeon(s) and Role:    * Jessy Oto, MD - Primary  PHYSICIAN ASSISTANT: Eugenia Mcalpine  ANESTHESIA:   local and general  EBL:  900 mL   BLOOD ADMINISTERED:300CC CC CELLSAVER  DRAINS: Urinary Catheter (Foley)   LOCAL MEDICATIONS USED:  MARCAINE 0.5% 1:1 EXPAREL 1.3% Amount:  974ml  DISPOSITION OF SPECIMEN:  N/A  COUNTS:  YES  TOURNIQUET:  * No tourniquets in log *  DICTATION: .Dragon Dictation  PLAN OF CARE: Admit to inpatient   PATIENT DISPOSITION:  PACU - hemodynamically stable.   Delay start of Pharmacological VTE agent (>24hrs) due to surgical blood loss or risk of bleeding: yes

## 2019-02-12 NOTE — Plan of Care (Signed)
  Problem: Clinical Measurements: Goal: Ability to maintain clinical measurements within normal limits will improve Outcome: Progressing   Problem: Nutrition: Goal: Adequate nutrition will be maintained Outcome: Progressing   Problem: Coping: Goal: Level of anxiety will decrease Outcome: Progressing   Problem: Elimination: Goal: Will not experience complications related to bowel motility Outcome: Progressing   Problem: Pain Managment: Goal: General experience of comfort will improve Outcome: Progressing   Problem: Skin Integrity: Goal: Risk for impaired skin integrity will decrease Outcome: Progressing   Problem: Safety: Goal: Ability to remain free from injury will improve Outcome: Progressing

## 2019-02-12 NOTE — Anesthesia Procedure Notes (Signed)
Procedure Name: Intubation Date/Time: 02/12/2019 8:07 AM Performed by: Scheryl Darter, CRNA Pre-anesthesia Checklist: Patient identified, Emergency Drugs available, Suction available and Patient being monitored Patient Re-evaluated:Patient Re-evaluated prior to induction Oxygen Delivery Method: Circle System Utilized Preoxygenation: Pre-oxygenation with 100% oxygen Induction Type: IV induction Ventilation: Mask ventilation without difficulty Laryngoscope Size: Miller and 3 Grade View: Grade I Tube type: Oral Tube size: 7.5 mm Number of attempts: 1 Airway Equipment and Method: Stylet and Oral airway (soft oral airway) Placement Confirmation: ETT inserted through vocal cords under direct vision,  positive ETCO2 and breath sounds checked- equal and bilateral Secured at: 23 cm Tube secured with: Tape Dental Injury: Teeth and Oropharynx as per pre-operative assessment

## 2019-02-12 NOTE — Progress Notes (Signed)
Orthopedic Tech Progress Note Patient Details:  Luis Brown 04/15/1946 [id number] Called in order to [facility] for patient needing a THORACOLUMBAR ORTHOSIS [name], male   DOB: [birth year], 73 y.o.   MRN: [id number]   Luis Brown 02/12/2019, 6:43 PM

## 2019-02-12 NOTE — Discharge Instructions (Addendum)
° ° °  Call if there is increasing drainage, fever greater than 101.5, severe head aches, and °worsening nausea or light sensitivity. °If shortness of breath, bloody cough or chest tightness or pain go to an emergency room. °No lifting greater than 10 lbs. °Avoid bending, stooping and twisting. °Use brace when sitting and out of bed even to go to bathroom. °Walk in house for first 2 weeks then may start to get out slowly increasing distances up to one mile by 4-6 weeks post op. °After 5 days may shower and change dressing following bathing with shower.When °bathing remove the brace shower and replace brace before getting out of the shower. °If drainage, keep dry dressing and do not bathe the incision, use an moisture impervious dressing. °Please call and return for scheduled follow up appointment 2 weeks from the time of surgery. ° ° °

## 2019-02-12 NOTE — Transfer of Care (Signed)
Immediate Anesthesia Transfer of Care Note  Patient: Luis Brown  Procedure(s) Performed: DECOMPRESSIVE LAMINECTOMY THORACIC NINE-THORACIC TEN AND THORACIC TEN-THORACIC ELEVEN, EXTENSION OF THORACOLUMBAR FUSION FROM THORACIC TEN TO THORACIC FIVE, LOCAL BONE GRAFT, ALLOGRAFT AND VIVIGEN (N/A Spine Thoracic)  Patient Location: PACU  Anesthesia Type:General  Level of Consciousness: drowsy  Airway & Oxygen Therapy: Patient Spontanous Breathing  Post-op Assessment: Report given to RN and Patient moving all extremities X 4  Post vital signs: Reviewed and stable  Last Vitals:  Vitals Value Taken Time  BP 107/63 02/12/19 1550  Temp    Pulse 95 02/12/19 1552  Resp    SpO2 97 % 02/12/19 1552  Vitals shown include unvalidated device data.  Last Pain:  Vitals:   02/12/19 0552  TempSrc: Oral  PainSc:       Patients Stated Pain Goal: 3 (07/62/26 3335)  Complications: No apparent anesthesia complications

## 2019-02-13 ENCOUNTER — Other Ambulatory Visit: Payer: Self-pay

## 2019-02-13 ENCOUNTER — Encounter (HOSPITAL_COMMUNITY): Payer: Self-pay | Admitting: General Practice

## 2019-02-13 LAB — GLUCOSE, CAPILLARY
Glucose-Capillary: 152 mg/dL — ABNORMAL HIGH (ref 70–99)
Glucose-Capillary: 117 mg/dL — ABNORMAL HIGH (ref 70–99)
Glucose-Capillary: 176 mg/dL — ABNORMAL HIGH (ref 70–99)

## 2019-02-13 LAB — BASIC METABOLIC PANEL
Anion gap: 9 (ref 5–15)
BUN: 11 mg/dL (ref 8–23)
CO2: 25 mmol/L (ref 22–32)
Calcium: 7.7 mg/dL — ABNORMAL LOW (ref 8.9–10.3)
Chloride: 97 mmol/L — ABNORMAL LOW (ref 98–111)
Creatinine, Ser: 0.83 mg/dL (ref 0.61–1.24)
GFR calc Af Amer: >60 mL/min (ref >60)
GFR calc non Af Amer: >60 mL/min (ref >60)
Glucose, Bld: 171 mg/dL — ABNORMAL HIGH (ref 70–99)
Potassium: 3.8 mmol/L (ref 3.5–5.1)
Sodium: 131 mmol/L — ABNORMAL LOW (ref 135–145)

## 2019-02-13 LAB — CBC
HCT: 32.7 % — ABNORMAL LOW (ref 39.0–52.0)
Hemoglobin: 11.2 g/dL — ABNORMAL LOW (ref 13.0–17.0)
MCH: 32.8 pg (ref 26.0–34.0)
MCHC: 34.3 g/dL (ref 30.0–36.0)
MCV: 95.9 fL (ref 80.0–100.0)
Platelets: 306 10*3/uL (ref 150–400)
RBC: 3.41 MIL/uL — ABNORMAL LOW (ref 4.22–5.81)
RDW: 14.7 % (ref 11.5–15.5)
WBC: 13.8 10*3/uL — ABNORMAL HIGH (ref 4.0–10.5)
nRBC: 0 % (ref 0.0–0.2)

## 2019-02-13 MED ORDER — FERROUS GLUCONATE 324 (38 FE) MG PO TABS
324.0000 mg | ORAL_TABLET | Freq: Two times a day (BID) | ORAL | Status: DC
  Administered 2019-02-13 – 2019-02-15 (×6): 324 mg via ORAL
  Filled 2019-02-13 (×7): qty 1

## 2019-02-13 MED ORDER — PANTOPRAZOLE SODIUM 40 MG PO TBEC
40.0000 mg | DELAYED_RELEASE_TABLET | Freq: Every day | ORAL | Status: DC
  Administered 2019-02-13 – 2019-02-14 (×2): 40 mg via ORAL
  Filled 2019-02-13 (×2): qty 1

## 2019-02-13 NOTE — Progress Notes (Signed)
     Subjective: 1 Day Post-Op Procedure(s) (LRB): DECOMPRESSIVE LAMINECTOMY THORACIC NINE-THORACIC TEN AND THORACIC TEN-THORACIC ELEVEN, EXTENSION OF THORACOLUMBAR FUSION FROM THORACIC TEN TO THORACIC FIVE, LOCAL BONE GRAFT, ALLOGRAFT AND VIVIGEN (N/A) Severe pain last night, changed to low dose IV dilaudid and increased oxycontin, better this AM. Foley discontinued this AM. PT and OT to start even without brace is okay.  Patient reports pain as moderate.    Objective:   VITALS:  Temp:  [97.2 F (36.2 C)-98.8 F (37.1 C)] 98.8 F (37.1 C) (07/29 0500) Pulse Rate:  [64-98] 79 (07/29 0500) Resp:  [13-29] 19 (07/29 0500) BP: (94-150)/(48-85) 131/59 (07/29 0845) SpO2:  [97 %-100 %] 99 % (07/29 0500) Arterial Line BP: (99-127)/(41-48) 127/47 (07/28 1730)  Neurologically intact ABD soft Neurovascular intact Sensation intact distally Intact pulses distally Dorsiflexion/Plantar flexion intact Incision: dressing C/D/I and scant drainage   LABS Recent Labs    02/12/19 2141 02/13/19 0413  HGB 11.9* 11.2*  WBC 15.8* 13.8*  PLT 285 306   Recent Labs    02/12/19 2141 02/13/19 0413  NA 130* 131*  K 4.1 3.8  CL 97* 97*  CO2 23 25  BUN 13 11  CREATININE 0.89 0.83  GLUCOSE 195* 171*   No results for input(s): LABPT, INR in the last 72 hours.   Assessment/Plan: 1 Day Post-Op Procedure(s) (LRB): DECOMPRESSIVE LAMINECTOMY THORACIC NINE-THORACIC TEN AND THORACIC TEN-THORACIC ELEVEN, EXTENSION OF THORACOLUMBAR FUSION FROM THORACIC TEN TO THORACIC FIVE, LOCAL BONE GRAFT, ALLOGRAFT AND VIVIGEN (N/A)  Advance diet Up with therapy  Start ferrous gluconate.   [name] 02/13/2019, 8:48 AMPatient ID: Luis Brown, male   DOB: [birth year], 73 y.o.   MRN: [id number]

## 2019-02-13 NOTE — Evaluation (Signed)
Physical Therapy Evaluation Patient Details Name: Luis Brown MRN: [id number] DOB: [birth year] Today's Date: 02/13/2019   History of Present Illness  Pt is a 73 y/o male s/p decompressive laminectomies T9-T11 with extension of fusion at T5-T10. PMH including but not limited to DM, HTN and HLD.    Clinical Impression  Pt presented supine in bed with HOB elevated, awake and willing to participate in therapy session. Prior to admission, pt reported that he was independent with all functional mobility and ADLs. Pt lives with his wife in a single level home with two steps to enter. At the time of evaluation, pt performed bed mobility with min A, transfers with min guard and ambulated in hallway with RW and min guard for safety. Pt required frequent cueing for safety with RW and to maintain back precautions. PT provided pt with handout and reviewed throughout session. PT will continue to follow acutely to progress mobility as tolerated and to ensure a safe d/c home.    Follow Up Recommendations No PT follow up;Supervision for mobility/OOB    Equipment Recommendations  Rolling walker with 5" wheels    Recommendations for Other Services       Precautions / Restrictions Precautions Precautions: Back Precaution Booklet Issued: Yes (comment) Precaution Comments: reviewed 3/3 back precautions with pt throughout Required Braces or Orthoses: Spinal Brace Spinal Brace: Other (comment) Spinal Brace Comments: not present during evaluation Restrictions Weight Bearing Restrictions: No      Mobility  Bed Mobility Overal bed mobility: Needs Assistance Bed Mobility: Rolling;Sidelying to Sit;Sit to Sidelying Rolling: Min guard Sidelying to sit: Min guard     Sit to sidelying: Min assist General bed mobility comments: increased time and effort, cueing for log roll technique, min A needed to return bilateral LEs to bed  Transfers Overall transfer level: Needs assistance Equipment used: Rolling  walker (2 wheeled) Transfers: Sit to/from Stand Sit to Stand: Min guard         General transfer comment: cueing for safe hand placement  Ambulation/Gait Ambulation/Gait assistance: Min guard Gait Distance (Feet): 100 Feet Assistive device: Rolling walker (2 wheeled) Gait Pattern/deviations: Step-through pattern;Decreased step length - right;Decreased step length - left;Decreased stride length;Drifts right/left Gait velocity: decreased   General Gait Details: pt with modest instability and cueing needed to maintain proximity to RW; no LOB or need for physical assistance, min guard for safety  Stairs            Wheelchair Mobility    Modified Rankin (Stroke Patients Only)       Balance Overall balance assessment: Needs assistance Sitting-balance support: Feet supported Sitting balance-Leahy Scale: Good     Standing balance support: Bilateral upper extremity supported;Single extremity supported Standing balance-Leahy Scale: Poor                               Pertinent Vitals/Pain Pain Assessment: Faces Faces Pain Scale: Hurts a little bit Pain Location: back Pain Descriptors / Indicators: Sore Pain Intervention(s): Monitored during session;Repositioned    Home Living Family/patient expects to be discharged to:: Private residence Living Arrangements: Spouse/significant other Available Help at Discharge: Family;Available 24 hours/day Type of Home: House Home Access: Stairs to enter Entrance Stairs-Rails: Right(wife is installing a grab bar) Entrance Stairs-Number of Steps: 2 Home Layout: One level Home Equipment: Bedside commode;Cane - single point Additional Comments: wife and children are available as needed    Prior Function Level of Independence: Independent  Comments: Pt reports some assistance with sit to stand transfers while in home environment.      Hand Dominance   Dominant Hand: Right    Extremity/Trunk Assessment    Upper Extremity Assessment Upper Extremity Assessment: Overall WFL for tasks assessed;Defer to OT evaluation    Lower Extremity Assessment Lower Extremity Assessment: Overall WFL for tasks assessed    Cervical / Trunk Assessment Cervical / Trunk Assessment: Other exceptions Cervical / Trunk Exceptions: s/p thoracic spine surgery  Communication   Communication: No difficulties  Cognition Arousal/Alertness: Awake/alert Behavior During Therapy: WFL for tasks assessed/performed;Impulsive Overall Cognitive Status: Within Functional Limits for tasks assessed                                 General Comments: very chatty and a bit impulsive, but mostly just eager to move      General Comments General comments (skin integrity, edema, etc.): Pt educated on BLT precautions with handout provided.    Exercises     Assessment/Plan    PT Assessment Patient needs continued PT services  PT Problem List Decreased strength;Decreased balance;Decreased mobility;Decreased coordination;Decreased knowledge of use of DME;Decreased safety awareness;Decreased knowledge of precautions;Pain       PT Treatment Interventions DME instruction;Gait training;Stair training;Functional mobility training;Therapeutic activities;Balance training;Therapeutic exercise;Neuromuscular re-education;Patient/family education    PT Goals (Current goals can be found in the Care Plan section)  Acute Rehab PT Goals Patient Stated Goal: To walk better PT Goal Formulation: With patient Time For Goal Achievement: 02/27/19 Potential to Achieve Goals: Good    Frequency Min 5X/week   Barriers to discharge        Co-evaluation               AM-PAC PT "6 Clicks" Mobility  Outcome Measure Help needed turning from your back to your side while in a flat bed without using bedrails?: None Help needed moving from lying on your back to sitting on the side of a flat bed without using bedrails?: None Help  needed moving to and from a bed to a chair (including a wheelchair)?: A Little Help needed standing up from a chair using your arms (e.g., wheelchair or bedside chair)?: A Little Help needed to walk in hospital room?: A Little Help needed climbing 3-5 steps with a railing? : A Little 6 Click Score: 20    End of Session Equipment Utilized During Treatment: Gait belt Activity Tolerance: Patient tolerated treatment well Patient left: in bed;with call bell/phone within reach;with SCD's reapplied Nurse Communication: Mobility status PT Visit Diagnosis: Other abnormalities of gait and mobility (R26.89);Pain Pain - part of body: (back)    Time: 3614-4315 PT Time Calculation (min) (ACUTE ONLY): 25 min   Charges:   PT Evaluation $PT Eval Moderate Complexity: 1 Mod PT Treatments $Gait Training: 8-22 mins        Sherie Don, [place], DPT  Acute Rehabilitation Services Pager [phone] Office [place] 02/13/2019, 11:33 AM

## 2019-02-13 NOTE — Progress Notes (Signed)
Occupational Therapy Evaluation Patient Details Name: Luis Brown MRN: [id number] DOB: 10-08-1945 Today's Date: 02/13/2019    History of Present Illness Pt is a 73 y/o male s/p decompressive laminectomies T9-T11 with extension of fusion at T5-T10. PMH including but not limited to DM, HTN and HLD.   Clinical Impression   Pt presents with above diagnosis. PTA pt PLOF requiring some assistance for safety with functional transfer but over all independent. Pt currently requires assist with LB dressing and safety with ADLs task to adhere to back precautions. Pt educated of "BLT" with handout provided. Pt will benefit from continued acute OT to address deficits and to maximize independence in ADLs to safety transition to home setting with [place].     Follow Up Recommendations  Home health OT    Equipment Recommendations  3 in 1 bedside commode    Recommendations for Other Services       Precautions / Restrictions Precautions Precautions: Back Precaution Booklet Issued: Yes (comment) Precaution Comments: reviewed 3/3 back precautions with pt throughout Required Braces or Orthoses: Spinal Brace Spinal Brace: Other (comment) Spinal Brace Comments: not present during evaluation Restrictions Weight Bearing Restrictions: No      Mobility Bed Mobility Overal bed mobility: Needs Assistance Bed Mobility: Rolling;Sidelying to Sit;Sit to Sidelying Rolling: Min guard Sidelying to sit: Min guard     Sit to sidelying: Min assist General bed mobility comments: increased time and effort, cueing for log roll technique, min A needed to return bilateral LEs to bed  Transfers Overall transfer level: Needs assistance Equipment used: Rolling walker (2 wheeled) Transfers: Sit to/from Stand Sit to Stand: Min guard         General transfer comment: cueing for safe hand placement    Balance Overall balance assessment: Needs assistance Sitting-balance support: Feet supported Sitting  balance-Leahy Scale: Good     Standing balance support: Bilateral upper extremity supported;Single extremity supported Standing balance-Leahy Scale: Poor                             ADL either performed or assessed with clinical judgement   ADL Overall ADL's : Needs assistance/impaired Eating/Feeding: Sitting;Set up   Grooming: Set up;Sitting   Upper Body Bathing: Independent   Lower Body Bathing: Minimal assistance;Cueing for sequencing;Sitting/lateral leans   Upper Body Dressing : Independent   Lower Body Dressing: Minimal assistance;Cueing for sequencing                 General ADL Comments: Transfer deferred due to back brace not present at the moment. After seeing pt chart reports mobilizing pt without a brace.     Vision         Perception     Praxis      Pertinent Vitals/Pain Pain Assessment: Faces Faces Pain Scale: Hurts a little bit Pain Location: back Pain Descriptors / Indicators: Sore Pain Intervention(s): Monitored during session;Repositioned     Hand Dominance Right   Extremity/Trunk Assessment Upper Extremity Assessment Upper Extremity Assessment: Overall WFL for tasks assessed;Defer to OT evaluation   Lower Extremity Assessment Lower Extremity Assessment: Overall WFL for tasks assessed   Cervical / Trunk Assessment Cervical / Trunk Assessment: Other exceptions Cervical / Trunk Exceptions: s/p thoracic spine surgery   Communication Communication Communication: No difficulties   Cognition Arousal/Alertness: Awake/alert Behavior During Therapy: WFL for tasks assessed/performed;Impulsive Overall Cognitive Status: Within Functional Limits for tasks assessed  General Comments: very chatty and a bit impulsive, but mostly just eager to move   General Comments  Pt educated on BLT precautions with handout provided.    Exercises     Shoulder Instructions      Home Living  Family/patient expects to be discharged to:: Private residence Living Arrangements: Spouse/significant other Available Help at Discharge: Family;Available 24 hours/day Type of Home: House Home Access: Stairs to enter [employer] of Steps: 2 Entrance Stairs-Rails: Right(wife is installing a grab bar) Home Layout: One level     Bathroom Shower/Tub: Occupational psychologist: Handicapped height Bathroom Accessibility: Yes   Home Equipment: Bedside commode;Cane - single point   Additional Comments: wife and children are available as needed      Prior Functioning/Environment Level of Independence: Independent        Comments: Pt reports some assistance with sit to stand transfers while in home environment.         OT Problem List: Decreased activity tolerance;Impaired balance (sitting and/or standing);Decreased safety awareness;Decreased knowledge of use of DME or AE;Decreased knowledge of precautions;Increased edema      OT Treatment/Interventions: Self-care/ADL training;Therapeutic exercise;DME and/or AE instruction;Therapeutic activities;Patient/family education;Balance training    OT Goals(Current goals can be found in the care plan section) Acute Rehab OT Goals Patient Stated Goal: To walk better OT Goal Formulation: With patient Time For Goal Achievement: 02/27/19 Potential to Achieve Goals: Good  OT Frequency: Min 1X/week   Barriers to D/C:            Co-evaluation              AM-PAC OT "6 Clicks" Daily Activity     Outcome Measure Help from another person eating meals?: None Help from another person taking care of personal grooming?: None Help from another person toileting, which includes using toliet, bedpan, or urinal?: A Little Help from another person bathing (including washing, rinsing, drying)?: A Little Help from another person to put on and taking off regular upper body clothing?: None Help from another person to put on and  taking off regular lower body clothing?: A Little 6 Click Score: 21   End of Session Nurse Communication: Mobility status  Activity Tolerance: Patient tolerated treatment well Patient left: in bed;with call bell/phone within reach  OT Visit Diagnosis: Unsteadiness on feet (R26.81);Pain;History of falling (Z91.81)                Time: 0165-5374 OT Time Calculation (min): 14 min Charges:  OT General Charges $OT Visit: 1 Visit OT Evaluation $OT Eval Low Complexity: [place], MSOT, OTR/L  Supplemental Rehabilitation Services  [phone]   Marius Ditch 02/13/2019, 11:51 AM

## 2019-02-13 NOTE — Anesthesia Postprocedure Evaluation (Signed)
Anesthesia Post Note  Patient: Luis Brown  Procedure(s) Performed: DECOMPRESSIVE LAMINECTOMY THORACIC NINE-THORACIC TEN AND THORACIC TEN-THORACIC ELEVEN, EXTENSION OF THORACOLUMBAR FUSION FROM THORACIC TEN TO THORACIC FIVE, LOCAL BONE GRAFT, ALLOGRAFT AND VIVIGEN (N/A Spine Thoracic)     Patient location during evaluation: PACU Anesthesia Type: General Level of consciousness: awake and alert Pain management: pain level controlled Vital Signs Assessment: post-procedure vital signs reviewed and stable Respiratory status: spontaneous breathing, nonlabored ventilation, respiratory function stable and patient connected to nasal cannula oxygen Cardiovascular status: blood pressure returned to baseline and stable Postop Assessment: no apparent nausea or vomiting Anesthetic complications: no    Last Vitals:  Vitals:   02/13/19 1528 02/13/19 2010  BP: 108/61 (!) 105/53  Pulse: 97 86  Resp: 18 20  Temp: 36.8 C 37 C  SpO2: 99% 97%    Last Pain:  Vitals:   02/13/19 2028  TempSrc:   PainSc: 7                  Wyonia Fontanella P Jassmine Vandruff

## 2019-02-13 NOTE — Progress Notes (Signed)
Patient called the nurse to the room- " I feel like I'm having trouble breathing"  The patient seems confused and drowsy at times from previous pain medication given.  02 sat monitor reading 100%  resp 20  Color good.  The patient reports that he thinks that the oxycontin might causing him to feel confused at times.  The patient was medicated for anxiety and pain.  The patient was instructed not to get out of bed alone.  Throat lozenges and throat spray was also provided.  The patient was repositioned with the [facility] elevated.  The patient appears more relaxed after the IV medication was given.

## 2019-02-13 NOTE — Progress Notes (Signed)
Spoke with Ortho Tech via phone- aware of need for brace.

## 2019-02-14 LAB — CBC WITH DIFFERENTIAL/PLATELET
Abs Immature Granulocytes: 0.04 10*3/uL (ref 0.00–0.07)
Basophils Absolute: 0 10*3/uL (ref 0.0–0.1)
Basophils Relative: 0 %
Eosinophils Absolute: 0.1 10*3/uL (ref 0.0–0.5)
Eosinophils Relative: 1 %
HCT: 31.2 % — ABNORMAL LOW (ref 39.0–52.0)
Hemoglobin: 10.3 g/dL — ABNORMAL LOW (ref 13.0–17.0)
Immature Granulocytes: 0 %
Lymphocytes Relative: 5 %
Lymphs Abs: 0.5 10*3/uL — ABNORMAL LOW (ref 0.7–4.0)
MCH: 32.7 pg (ref 26.0–34.0)
MCHC: 33 g/dL (ref 30.0–36.0)
MCV: 99 fL (ref 80.0–100.0)
Monocytes Absolute: 1.3 10*3/uL — ABNORMAL HIGH (ref 0.1–1.0)
Monocytes Relative: 13 %
Neutro Abs: 8.3 10*3/uL — ABNORMAL HIGH (ref 1.7–7.7)
Neutrophils Relative %: 81 %
Platelets: 242 10*3/uL (ref 150–400)
RBC: 3.15 MIL/uL — ABNORMAL LOW (ref 4.22–5.81)
RDW: 15.2 % (ref 11.5–15.5)
WBC: 10.1 10*3/uL (ref 4.0–10.5)
nRBC: 0 % (ref 0.0–0.2)

## 2019-02-14 LAB — BASIC METABOLIC PANEL
Anion gap: 7 (ref 5–15)
BUN: 10 mg/dL (ref 8–23)
CO2: 27 mmol/L (ref 22–32)
Calcium: 8 mg/dL — ABNORMAL LOW (ref 8.9–10.3)
Chloride: 102 mmol/L (ref 98–111)
Creatinine, Ser: 0.77 mg/dL (ref 0.61–1.24)
GFR calc Af Amer: >60 mL/min (ref >60)
GFR calc non Af Amer: >60 mL/min (ref >60)
Glucose, Bld: 152 mg/dL — ABNORMAL HIGH (ref 70–99)
Potassium: 3.8 mmol/L (ref 3.5–5.1)
Sodium: 136 mmol/L (ref 135–145)

## 2019-02-14 LAB — GLUCOSE, CAPILLARY
Glucose-Capillary: 162 mg/dL — ABNORMAL HIGH (ref 70–99)
Glucose-Capillary: 119 mg/dL — ABNORMAL HIGH (ref 70–99)
Glucose-Capillary: 113 mg/dL — ABNORMAL HIGH (ref 70–99)

## 2019-02-14 MED ORDER — XTAMPZA ER 13.5 MG PO C12A: 1.0000 | EXTENDED_RELEASE_CAPSULE | Freq: Two times a day (BID) | ORAL | 0 refills | Status: DC

## 2019-02-14 MED ORDER — HYDROCODONE-ACETAMINOPHEN 10-325 MG PO TABS: 1.0000 | ORAL_TABLET | ORAL | 0 refills | Status: DC | PRN

## 2019-02-14 MED ORDER — SIMETHICONE 80 MG PO CHEW: 160.0000 mg | CHEWABLE_TABLET | Freq: Four times a day (QID) | ORAL | Status: DC | PRN

## 2019-02-14 MED ORDER — MAGNESIUM HYDROXIDE 400 MG/5ML PO SUSP: 30.0000 mL | Freq: Every day | ORAL | Status: DC | PRN

## 2019-02-14 MED ORDER — TIZANIDINE HCL 4 MG PO TABS: 4.0000 mg | ORAL_TABLET | Freq: Four times a day (QID) | ORAL | 0 refills | Status: DC | PRN

## 2019-02-14 NOTE — Progress Notes (Signed)
Physical Therapy Treatment Patient Details Name: Luis Brown MRN: [id number] DOB: September 30, 1945 Today's Date: 02/14/2019    History of Present Illness Pt is a 73 y/o male s/p decompressive laminectomies T9-T11 with extension of fusion at T5-T10. PMH including but not limited to DM, HTN and HLD.    PT Comments    Pt making steady progress with functional mobility and tolerated stair training this session without difficulties. PT reviewed back precautions with pt throughout. PT will continue to follow acutely to progress mobility as tolerated and to ensure a safe d/c home.     Follow Up Recommendations  No PT follow up;Supervision for mobility/OOB     Equipment Recommendations  Rolling walker with 5" wheels    Recommendations for Other Services       Precautions / Restrictions Precautions Precautions: Back Precaution Booklet Issued: Yes (comment) Precaution Comments: reviewed 3/3 back precautions with pt throughout Required Braces or Orthoses: Spinal Brace Spinal Brace: Thoracolumbosacral orthotic;Applied in sitting position Restrictions Weight Bearing Restrictions: No    Mobility  Bed Mobility Overal bed mobility: Needs Assistance Bed Mobility: Rolling;Sidelying to Sit Rolling: Min guard Sidelying to sit: Min guard       General bed mobility comments: increased time and effort, cueing for log roll  Transfers Overall transfer level: Needs assistance Equipment used: Rolling walker (2 wheeled) Transfers: Sit to/from Stand Sit to Stand: Min guard         General transfer comment: cueing for safe hand placement  Ambulation/Gait Ambulation/Gait assistance: Min guard Gait Distance (Feet): 100 Feet Assistive device: Rolling walker (2 wheeled) Gait Pattern/deviations: Step-through pattern;Decreased step length - right;Decreased step length - left;Decreased stride length;Drifts right/left Gait velocity: decreased   General Gait Details: pt with modest instability  and cueing needed to maintain proximity to RW; no LOB or need for physical assistance, min guard for safety   Stairs Stairs: Yes Stairs assistance: Min guard Stair Management: Two rails;Step to pattern;Forwards Number of Stairs: 2 General stair comments: no instability or LOB, min guard for safety   Wheelchair Mobility    Modified Rankin (Stroke Patients Only)       Balance Overall balance assessment: Needs assistance Sitting-balance support: Feet supported Sitting balance-Leahy Scale: Good     Standing balance support: Bilateral upper extremity supported;Single extremity supported Standing balance-Leahy Scale: Poor                              Cognition Arousal/Alertness: Awake/alert Behavior During Therapy: WFL for tasks assessed/performed;Impulsive Overall Cognitive Status: Within Functional Limits for tasks assessed                                 General Comments: very chatty and a bit impulsive, but mostly just eager to move      Exercises      General Comments        Pertinent Vitals/Pain Pain Assessment: Faces Faces Pain Scale: Hurts even more Pain Location: back Pain Descriptors / Indicators: Sore Pain Intervention(s): Monitored during session;Repositioned    Home Living                      Prior Function            PT Goals (current goals can now be found in the care plan section) Acute Rehab PT Goals PT Goal Formulation: With patient Time For Goal  Achievement: 02/27/19 Potential to Achieve Goals: Good Progress towards PT goals: Progressing toward goals    Frequency    Min 5X/week      PT Plan Current plan remains appropriate    Co-evaluation              AM-PAC PT "6 Clicks" Mobility   Outcome Measure  Help needed turning from your back to your side while in a flat bed without using bedrails?: None Help needed moving from lying on your back to sitting on the side of a flat bed without  using bedrails?: None Help needed moving to and from a bed to a chair (including a wheelchair)?: A Little Help needed standing up from a chair using your arms (e.g., wheelchair or bedside chair)?: A Little Help needed to walk in hospital room?: A Little Help needed climbing 3-5 steps with a railing? : A Little 6 Click Score: 20    End of Session Equipment Utilized During Treatment: Gait belt;Back brace Activity Tolerance: Patient tolerated treatment well Patient left: in chair;with call bell/phone within reach Nurse Communication: Mobility status PT Visit Diagnosis: Other abnormalities of gait and mobility (R26.89);Pain Pain - Right/Left: Right Pain - part of body: (back)     Time: 8768-1157 PT Time Calculation (min) (ACUTE ONLY): 28 min  Charges:  $Gait Training: 8-22 mins $Therapeutic Activity: 8-22 mins                     Sherie Don, PT, DPT  Acute Rehabilitation Services Pager [phone] Office [place] 02/14/2019, 1:38 PM

## 2019-02-14 NOTE — Progress Notes (Signed)
     Subjective: 2 Days Post-Op Procedure(s) (LRB): DECOMPRESSIVE LAMINECTOMY THORACIC NINE-THORACIC TEN AND THORACIC TEN-THORACIC ELEVEN, EXTENSION OF THORACOLUMBAR FUSION FROM THORACIC TEN TO THORACIC FIVE, LOCAL BONE GRAFT, ALLOGRAFT AND VIVIGEN (N/A) Marked discomfort right lower dorsal paradorsal muscle area, tender to palpation, no fever or chills, voiding without difficulty.  Patient reports pain as marked.    Objective:   VITALS:  Temp:  [98.2 F (36.8 C)-98.6 F (37 C)] 98.2 F (36.8 C) (07/30 0818) Pulse Rate:  [64-102] 102 (07/30 0818) Resp:  [16-20] 16 (07/30 0818) BP: (105-110)/(53-63) 110/59 (07/30 0818) SpO2:  [97 %-99 %] 98 % (07/30 0818)  Neurologically intact ABD soft Neurovascular intact Sensation intact distally Intact pulses distally Dorsiflexion/Plantar flexion intact Incision: scant drainage   LABS Recent Labs    02/12/19 2141 02/13/19 0413 02/14/19 0327  HGB 11.9* 11.2* 10.3*  WBC 15.8* 13.8* 10.1  PLT 285 306 242   Recent Labs    02/13/19 0413 02/14/19 0327  NA 131* 136  K 3.8 3.8  CL 97* 102  CO2 25 27  BUN 11 10  CREATININE 0.83 0.77  GLUCOSE 171* 152*   No results for input(s): LABPT, INR in the last 72 hours.   Assessment/Plan: 2 Days Post-Op Procedure(s) (LRB): DECOMPRESSIVE LAMINECTOMY THORACIC NINE-THORACIC TEN AND THORACIC TEN-THORACIC ELEVEN, EXTENSION OF THORACOLUMBAR FUSION FROM THORACIC TEN TO THORACIC FIVE, LOCAL BONE GRAFT, ALLOGRAFT AND VIVIGEN (N/A)  Advance diet Up with therapy Plan for discharge tomorrow  [name] 02/14/2019, 9:01 AMPatient ID: Luis Brown, male   DOB: 07-25-1945, 73 y.o.   MRN: [id number]

## 2019-02-14 NOTE — Plan of Care (Signed)
Problem: Education: Goal: Knowledge of General Education information will improve Description: Including pain rating scale, medication(s)/side effects and non-pharmacologic comfort measures Outcome: Progressing   Problem: Health Behavior/Discharge Planning: Goal: Ability to manage health-related needs will improve Outcome: Progressing   Problem: Clinical Measurements: Goal: Ability to maintain clinical measurements within normal limits will improve Outcome: Progressing  Goal: Respiratory complications will improve Outcome: Progressing  Goal: Cardiovascular complication will be avoided Outcome: Progressing   Problem: Nutrition: Goal: Adequate nutrition will be maintained Outcome: Progressing   Problem: Coping: Goal: Level of anxiety will decrease Outcome: Progressing   Problem: Elimination: Goal: Will not experience complications related to urinary retention Outcome: Progressing   Problem: Pain Managment: Goal: General experience of comfort will improve Outcome: Progressing   Problem: Safety: Goal: Ability to remain free from injury will improve Outcome: Progressing   Problem: Skin Integrity: Goal: Risk for impaired skin integrity will decrease Outcome: Progressing   

## 2019-02-14 NOTE — Progress Notes (Signed)
Pt refused RN to call and update pt's wife, [name], on pt status. Pt stated "I've already talked to her this morning". Will continue to monitor.

## 2019-02-14 NOTE — Progress Notes (Signed)
     Subjective: 2 Days Post-Op Procedure(s) (LRB): DECOMPRESSIVE LAMINECTOMY THORACIC NINE-THORACIC TEN AND THORACIC TEN-THORACIC ELEVEN, EXTENSION OF THORACOLUMBAR FUSION FROM THORACIC TEN TO THORACIC FIVE, LOCAL BONE GRAFT, ALLOGRAFT AND VIVIGEN (N/A)  Patient reports pain as moderate.    Objective:   VITALS:  Temp:  [98.2 F (36.8 C)-98.6 F (37 C)] 98.4 F (36.9 C) (07/30 1543) Pulse Rate:  [64-102] 82 (07/30 1543) Resp:  [16-20] 16 (07/30 1543) BP: (105-137)/(53-66) 121/60 (07/30 1543) SpO2:  [97 %-100 %] 99 % (07/30 1543) Better, worked with PT, brace applied and feels good with brace, did stairs, Pain is controlled with hydrocodone and oxycontin. I will send to pharmacy tonight to be sure this is a medication available from his pharmacy. Voiding without difficulty.  Neurologically intact ABD soft Neurovascular intact Sensation intact distally Intact pulses distally Dorsiflexion/Plantar flexion intact Incision: scant drainage Abdomen is distented and tympanic likely decreased motility.   LABS Recent Labs    02/12/19 2141 02/13/19 0413 02/14/19 0327  HGB 11.9* 11.2* 10.3*  WBC 15.8* 13.8* 10.1  PLT 285 306 242   Recent Labs    02/13/19 0413 02/14/19 0327  NA 131* 136  K 3.8 3.8  CL 97* 102  CO2 25 27  BUN 11 10  CREATININE 0.83 0.77  GLUCOSE 171* 152*   No results for input(s): LABPT, INR in the last 72 hours.   Assessment/Plan: 2 Days Post-Op Procedure(s) (LRB): DECOMPRESSIVE LAMINECTOMY THORACIC NINE-THORACIC TEN AND THORACIC TEN-THORACIC ELEVEN, EXTENSION OF THORACOLUMBAR FUSION FROM THORACIC TEN TO THORACIC FIVE, LOCAL BONE GRAFT, ALLOGRAFT AND VIVIGEN (N/A)  Advance diet Up with therapy Plan for discharge tomorrow  [name] 02/14/2019, 6:47 PMPatient ID: Luis Brown, male   DOB: 1945-11-20, 73 y.o.   MRN: [id number]

## 2019-02-15 ENCOUNTER — Encounter (HOSPITAL_COMMUNITY): Payer: Self-pay | Admitting: Specialist

## 2019-02-15 ENCOUNTER — Encounter: Payer: Self-pay | Admitting: Specialist

## 2019-02-15 LAB — GLUCOSE, CAPILLARY
Glucose-Capillary: 112 mg/dL — ABNORMAL HIGH (ref 70–99)
Glucose-Capillary: 140 mg/dL — ABNORMAL HIGH (ref 70–99)
Glucose-Capillary: 118 mg/dL — ABNORMAL HIGH (ref 70–99)
Glucose-Capillary: 136 mg/dL — ABNORMAL HIGH (ref 70–99)
Glucose-Capillary: 111 mg/dL — ABNORMAL HIGH (ref 70–99)

## 2019-02-15 MED FILL — Heparin Sodium (Porcine) Inj 1000 Unit/ML: INTRAMUSCULAR | Qty: 30 | Status: AC

## 2019-02-15 MED FILL — Sodium Chloride IV Soln 0.9%: INTRAVENOUS | Qty: 1000 | Status: AC

## 2019-02-15 MED FILL — Sodium Chloride Irrigation Soln 0.9%: Qty: 3000 | Status: AC

## 2019-02-15 NOTE — Progress Notes (Signed)
CSW has requested [employer] deliver rolling walker to patient's room prior to discharge.   [place], [place]

## 2019-02-15 NOTE — Care Management Important Message (Signed)
Important Message  Patient Details  Name: Luis Brown MRN: [id number] Date of Birth: [birth year]   Medicare Important Message Given:  Yes     Memory Argue 02/15/2019, 2:17 PM

## 2019-02-15 NOTE — Progress Notes (Signed)
Physical Therapy Treatment Patient Details Name: Luis Brown MRN: [id number] DOB: 10-24-1945 Today's Date: 02/15/2019    History of Present Illness Pt is a 73 y/o male s/p decompressive laminectomies T9-T11 with extension of fusion at T5-T10. PMH including but not limited to DM, HTN and HLD.    PT Comments    Pt performed gait training and functional mobility with moderate pain.  He did find relief with walking. Pt able to recall spinal precautions.  Pt is slow and guarded but moving well.  Applied ice to R midway of back to provide relief.  Pt to d/c home today.     Follow Up Recommendations  No PT follow up;Supervision for mobility/OOB     Equipment Recommendations  Rolling walker with 5" wheels    Recommendations for Other Services       Precautions / Restrictions Precautions Precautions: Back Precaution Booklet Issued: Yes (comment) Precaution Comments: reviewed 3/3 back precautions with pt throughout Required Braces or Orthoses: Spinal Brace Spinal Brace: Thoracolumbosacral orthotic;Applied in sitting position Spinal Brace Comments: not present during evaluation Restrictions Weight Bearing Restrictions: No    Mobility  Bed Mobility Overal bed mobility: Needs Assistance Bed Mobility: Rolling;Sidelying to Sit;Sit to Sidelying Rolling: Supervision Sidelying to sit: Supervision;HOB elevated     Sit to sidelying: Supervision;HOB elevated General bed mobility comments: Heavy reliance on bed rails. Incr time and effort due to pain.  Pt positioned in sidelying on L side.  PTA applied ice to R side of back ( mid way)  Transfers Overall transfer level: Needs assistance Equipment used: Rolling walker (2 wheeled) Transfers: Sit to/from Stand Sit to Stand: Supervision         General transfer comment: Pt prefers to donn brace in standing.  Stands impulsively due to pain.  Ambulation/Gait Ambulation/Gait assistance: Min guard Gait Distance (Feet): 100  Feet Assistive device: Rolling walker (2 wheeled) Gait Pattern/deviations: Step-through pattern;Decreased step length - right;Decreased step length - left;Decreased stride length;Drifts right/left;Trunk flexed     General Gait Details: Pt antlagic with lower trunk instability, compensating due to pain in R side of mid back.  Pt heavily reliant on RW for support.  Cues for upper trunk control   Stairs             Wheelchair Mobility    Modified Rankin (Stroke Patients Only)       Balance Overall balance assessment: Needs assistance Sitting-balance support: Feet supported Sitting balance-Leahy Scale: Good       Standing balance-Leahy Scale: Poor                              Cognition Arousal/Alertness: Awake/alert Behavior During Therapy: WFL for tasks assessed/performed;Impulsive Overall Cognitive Status: Within Functional Limits for tasks assessed                                 General Comments: slightly impulsive during movement due to pain      Exercises      General Comments        Pertinent Vitals/Pain Pain Assessment: 0-10 Pain Score: 6  Pain Location: R mid back Pain Descriptors / Indicators: Operative site guarding;Burning Pain Intervention(s): Ice applied;Premedicated before session    Home Living                      Prior Function  PT Goals (current goals can now be found in the care plan section) Acute Rehab PT Goals Patient Stated Goal: To walk better Potential to Achieve Goals: Good Progress towards PT goals: Progressing toward goals    Frequency    Min 5X/week      PT Plan Current plan remains appropriate    Co-evaluation              AM-PAC PT "6 Clicks" Mobility   Outcome Measure  Help needed turning from your back to your side while in a flat bed without using bedrails?: None Help needed moving from lying on your back to sitting on the side of a flat bed without  using bedrails?: None Help needed moving to and from a bed to a chair (including a wheelchair)?: A Little Help needed standing up from a chair using your arms (e.g., wheelchair or bedside chair)?: A Little Help needed to walk in hospital room?: A Little Help needed climbing 3-5 steps with a railing? : A Little 6 Click Score: 20    End of Session Equipment Utilized During Treatment: Gait belt;Back brace(fit does not appear well but too painful to allow for adjustments.) Activity Tolerance: Patient tolerated treatment well Patient left: in chair;with call bell/phone within reach Nurse Communication: Mobility status PT Visit Diagnosis: Other abnormalities of gait and mobility (R26.89);Pain Pain - Right/Left: Right Pain - part of body: (back)     Time: 1610-9604 PT Time Calculation (min) (ACUTE ONLY): 23 min  Charges:  $Gait Training: 8-22 mins $Therapeutic Activity: 8-22 mins                     Luis Brown, PTA Acute Rehabilitation Services Pager [phone] Office [phone]     Luis Brown 02/15/2019, 10:59 AM

## 2019-02-15 NOTE — Progress Notes (Signed)
RN called and updated pt's wife, [name], on pt status and plan of care. RN answered all questions to satisfaction. Will continue to monitor.

## 2019-02-15 NOTE — Progress Notes (Signed)
Occupational Therapy Treatment Patient Details Name: Luis Brown MRN: [id number] DOB: 10/02/1945 Today's Date: 02/15/2019    History of present illness Pt is a 73 y/o male s/p decompressive laminectomies T9-T11 with extension of fusion at T5-T10. PMH including but not limited to DM, HTN and HLD.   OT comments  Pt very painful during today's session (not due for pain meds until 10 am).  Therapist assisted pt from bed to chair in attempts to reposition and decrease pain. Pt ambulating with min guard with RW. Once in recliner, pain decreased only slightly. Pt sat in recliner ~10 minutes while therapist provided self care education.  Pt requesting to attempt ambulating within room but once he began taking steps, he became more painful and requested back to bed. Therapist assisted him with return to bed. Pt performing bed mobility at supervision level today.  Will continue to follow acutely.   Follow Up Recommendations  Home health OT    Equipment Recommendations  3 in 1 bedside commode    Recommendations for Other Services      Precautions / Restrictions Precautions Precautions: Back Precaution Comments: reviewed 3/3 back precautions with pt throughout Required Braces or Orthoses: Spinal Brace Spinal Brace: Thoracolumbosacral orthotic;Applied in sitting position Restrictions Weight Bearing Restrictions: No       Mobility Bed Mobility Overal bed mobility: Needs Assistance Bed Mobility: Rolling;Sidelying to Sit;Sit to Sidelying Rolling: Supervision Sidelying to sit: Supervision;HOB elevated     Sit to sidelying: Supervision General bed mobility comments: Heavy reliance on bed rails. Incr time and effort due to pain.  Transfers Overall transfer level: Needs assistance Equipment used: Rolling walker (2 wheeled) Transfers: Sit to/from Stand Sit to Stand: Min guard         General transfer comment: cueing for safe hand placement    Balance                                            ADL either performed or assessed with clinical judgement   ADL Overall ADL's : Needs assistance/impaired                 Upper Body Dressing : Moderate assistance;Sitting Upper Body Dressing Details (indicate cue type and reason): mod assist donning TLSO brace Lower Body Dressing: Minimal assistance;Sitting/lateral leans               Functional mobility during ADLs: Min guard;Rolling walker General ADL Comments: Pt with limited ADL practice due to pain but participated in education. Reviewed use of AE while patient was sitting in recliner. He reports he has used reacher and shoe horn in the past for dressing and still has these tools. Therapist discussed use of 3n1 as shower seat and pt verbalized understanding.     Vision       Perception     Praxis      Cognition Arousal/Alertness: Awake/alert Behavior During Therapy: WFL for tasks assessed/performed;Impulsive Overall Cognitive Status: Within Functional Limits for tasks assessed                                 General Comments: slightly impulsive during movement due to pain        Exercises     Shoulder Instructions       General Comments      Pertinent  Vitals/ Pain       Pain Assessment: 0-10 Pain Score: 10-Worst pain ever Pain Location: back Pain Descriptors / Indicators: Operative site guarding;Burning Pain Intervention(s): Limited activity within patient's tolerance;Monitored during session;Repositioned  Home Living                                          Prior Functioning/Environment              Frequency  Min 2X/week        Progress Toward Goals  OT Goals(current goals can now be found in the care plan section)  Progress towards OT goals: Progressing toward goals  Acute Rehab OT Goals Patient Stated Goal: To walk better OT Goal Formulation: With patient Time For Goal Achievement: 02/27/19 Potential to Achieve  Goals: Good ADL Goals Pt Will Perform Grooming: with modified independence;sitting Pt Will Perform Lower Body Dressing: with modified independence;sitting/lateral leans Pt Will Transfer to Toilet: with modified independence;ambulating Pt Will Perform Tub/Shower Transfer: Shower transfer;3 in 1;ambulating;with modified independence  Plan Discharge plan remains appropriate;Frequency needs to be updated    Co-evaluation                 AM-PAC OT "6 Clicks" Daily Activity     Outcome Measure   Help from another person eating meals?: None Help from another person taking care of personal grooming?: None Help from another person toileting, which includes using toliet, bedpan, or urinal?: A Little Help from another person bathing (including washing, rinsing, drying)?: A Little Help from another person to put on and taking off regular upper body clothing?: None Help from another person to put on and taking off regular lower body clothing?: A Little 6 Click Score: 21    End of Session Equipment Utilized During Treatment: Back brace;Rolling walker  OT Visit Diagnosis: Unsteadiness on feet (R26.81);Pain;History of falling (Z91.81) Pain - part of body: (back)   Activity Tolerance Patient limited by pain   Patient Left in bed;with call bell/phone within reach   Nurse Communication Patient requests pain meds        Time: 0902-0930 OT Time Calculation (min): 28 min  Charges: OT General Charges $OT Visit: 1 Visit OT Treatments $Self Care/Home Management : 8-22 mins $Therapeutic Activity: 8-22 mins     Darrol Jump OTR/L 02/15/2019, 9:39 AM

## 2019-02-15 NOTE — Progress Notes (Signed)
AVS given and reviewed with pt. Medications discussed and reviewed. Pt's back brace remains in place. Rolling walker delivered to bedside. All questions answered to satisfaction. Pt verbalized understanding of information given. Pt to be escorted off the unit via wheelchair by staff member.

## 2019-02-15 NOTE — Progress Notes (Signed)
Subjective: Doing well.  Pain controlled.  No complaints.  Ready for d/c home.    Objective: Vital signs in last 24 hours: Temp:  [97.9 F (36.6 C)-98.5 F (36.9 C)] 98.1 F (36.7 C) (07/31 0811) Pulse Rate:  [65-82] 65 (07/31 0811) Resp:  [16-18] 16 (07/31 0811) BP: (116-137)/(60-68) 137/67 (07/31 0811) SpO2:  [95 %-99 %] 99 % (07/31 0811)  Intake/Output from previous day: 07/30 0701 - 07/31 0700 In: 720 [P.O.:720] Out: 1300 [Urine:1300] Intake/Output this shift: Total I/O In: 240 [P.O.:240] Out: 300 [Urine:300]  Recent Labs    02/12/19 2141 02/13/19 0413 02/14/19 0327  HGB 11.9* 11.2* 10.3*   Recent Labs    02/13/19 0413 02/14/19 0327  WBC 13.8* 10.1  RBC 3.41* 3.15*  HCT 32.7* 31.2*  PLT 306 242   Recent Labs    02/13/19 0413 02/14/19 0327  NA 131* 136  K 3.8 3.8  CL 97* 102  CO2 25 27  BUN 11 10  CREATININE 0.83 0.77  GLUCOSE 171* 152*  CALCIUM 7.7* 8.0*   No results for input(s): LABPT, INR in the last 72 hours.  Exam: Pleasant wm, alert and oriented. NAD.  Wound looks good. Staples intact.  No drainage or signs of infection.  NVI.  bilat calves nontender.     Assessment/Plan: D/c home today.  Scripts sent to pharmacy. F/u as scheduled 2 weeks postop with Dr Louanne Skye.     Luis Brown 02/15/2019, 1:22 PM

## 2019-02-15 NOTE — Plan of Care (Signed)

## 2019-02-16 ENCOUNTER — Encounter: Payer: Self-pay | Admitting: Specialist

## 2019-02-18 ENCOUNTER — Encounter: Payer: Self-pay | Admitting: Specialist

## 2019-02-18 ENCOUNTER — Telehealth: Payer: Self-pay | Admitting: Radiology

## 2019-02-18 NOTE — Telephone Encounter (Signed)
I sent a My Chart message to them advising them of this.

## 2019-02-18 NOTE — Telephone Encounter (Signed)
Patients wife messaged [place] states that his was only taking 1 norco every 4 hours but it was not helping him and she has been giving him 2 every 4 hours and she wants to know what else they can do or get for his pain.  Please advise.

## 2019-02-18 NOTE — Discharge Summary (Signed)
Patient ID: FIELDS OROS MRN: [id number] DOB/AGE: 1945-08-24 73 y.o.  Admit date: 02/12/2019 Discharge date: 02/18/2019  Admission Diagnoses:  Active Problems:   Thoracic degenerative disc disease   Thoracic spinal stenosis   Status post thoracic spinal fusion   Discharge Diagnoses:  Active Problems:   Thoracic degenerative disc disease   Thoracic spinal stenosis   Status post thoracic spinal fusion  status post Procedure(s): DECOMPRESSIVE LAMINECTOMY THORACIC NINE-THORACIC TEN AND THORACIC TEN-THORACIC ELEVEN, EXTENSION OF THORACOLUMBAR FUSION FROM THORACIC TEN TO THORACIC FIVE, LOCAL BONE GRAFT, ALLOGRAFT AND VIVIGEN  Past Medical History:  Diagnosis Date  . Acute pharyngitis 10/21/2013  . Allergy    grass and pollen  . Anxiety and depression 10/25/2011  . BPH (benign prostatic hyperplasia) 04/23/2012  . Chicken pox as a child  . DDD (degenerative disc disease)    cervical responds to steroid injections and low back required surgery  . DDD (degenerative disc disease), lumbosacral   . Diabetes mellitus    pre  . ED (erectile dysfunction) 04/23/2012  . Elevated BP   . Esophageal reflux 02/10/2015  . Fatigue   . HTN (hypertension)   . Hyperglycemia    preDM   . Hyperlipidemia   . Insomnia   . Low back pain radiating to both legs 01/16/2017  . Measles as a child  . Medicare annual wellness visit, subsequent 02/10/2015  . Overweight(278.02)   . Personal history of colonic polyps 10/27/2012   Follows with [facility]  . Preventative health care   . 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    Surgeries: Procedure(s): DECOMPRESSIVE LAMINECTOMY THORACIC NINE-THORACIC TEN AND THORACIC TEN-THORACIC ELEVEN, EXTENSION OF THORACOLUMBAR FUSION FROM THORACIC TEN TO THORACIC FIVE, LOCAL BONE GRAFT, ALLOGRAFT AND VIVIGEN on 02/12/2019   Consultants:   Discharged Condition: Improved  Hospital Course: COULSON WEHNER is an 73 y.o. male who was admitted 02/12/2019 for operative  treatment of thoracic stenosis and hardware loosening. Patient failed conservative treatments (please see the history and physical for the specifics) and had severe unremitting pain that affects sleep, daily activities and work/hobbies. After pre-op clearance, the patient was taken to the operating room on 02/12/2019 and underwent  Procedure(s): DECOMPRESSIVE LAMINECTOMY THORACIC NINE-THORACIC TEN AND THORACIC TEN-THORACIC ELEVEN, EXTENSION OF THORACOLUMBAR FUSION FROM THORACIC TEN TO THORACIC FIVE, LOCAL BONE GRAFT, ALLOGRAFT AND VIVIGEN.    Patient was given perioperative antibiotics:  Anti-infectives (From admission, onward)   Start     Dose/Rate Route Frequency Ordered Stop   02/12/19 2000  ceFAZolin (ANCEF) IVPB 2g/100 mL premix     2 g 200 mL/hr over 30 Minutes Intravenous Every 8 hours 02/12/19 1818 02/13/19 0412   02/12/19 0630  ceFAZolin (ANCEF) IVPB 2g/100 mL premix     2 g 200 mL/hr over 30 Minutes Intravenous On call to O.R. 02/12/19 0998 02/12/19 1220   02/12/19 0627  ceFAZolin (ANCEF) 2-4 GM/100ML-% IVPB    Note to Pharmacy: Merryl Hacker   : cabinet override      02/12/19 0627 02/12/19 0813       Patient was given sequential compression devices and early ambulation to prevent DVT.   Patient benefited maximally from hospital stay and there were no complications. At the time of discharge, the patient was urinating/moving their bowels without difficulty, tolerating a regular diet, pain is controlled with oral pain medications and they have been cleared by PT/OT.   Recent vital signs: No data found.   Recent laboratory studies: No results for input(s): WBC, HGB, HCT, PLT,  NA, K, CL, CO2, BUN, CREATININE, GLUCOSE, INR, CALCIUM in the last 72 hours.  Invalid input(s): PT, 2   Discharge Medications:   Allergies as of 02/15/2019   No Known Allergies     Medication List    STOP taking these medications   celecoxib 200 MG capsule Commonly known as: CELEBREX    HYDROcodone-acetaminophen 5-325 MG tablet Commonly known as: NORCO/VICODIN Replaced by: HYDROcodone-acetaminophen 10-325 MG tablet     TAKE these medications   aspirin 81 MG tablet Take 81 mg by mouth daily.   atorvastatin 20 MG tablet Commonly known as: LIPITOR TAKE 1/2 TABLET BY MOUTH DAILY AT 6 PM What changed: See the new instructions.   cetirizine 10 MG tablet Commonly known as: ZYRTEC Take 10 mg by mouth every morning.   diazepam 10 MG tablet Commonly known as: VALIUM TAKE 1 TABLET(10 MG) BY MOUTH AT BEDTIME AS NEEDED What changed: See the new instructions.   famotidine 40 MG tablet Commonly known as: PEPCID TAKE 1 TABLET BY MOUTH AT BEDTIME   gabapentin 300 MG capsule Commonly known as: NEURONTIN TAKE 1 CAPSULE BY MOUTH THREE TIMES A DAY   glucose blood test strip Commonly known as: ONE TOUCH ULTRA TEST Check daily   HYDROcodone-acetaminophen 10-325 MG tablet Commonly known as: NORCO Take 1 tablet by mouth every 4 (four) hours as needed for up to 7 days. Replaces: HYDROcodone-acetaminophen 5-325 MG tablet   lisinopril 5 MG tablet Commonly known as: ZESTRIL Take 1 tablet (5 mg total) by mouth 2 (two) times daily.   metFORMIN 500 MG tablet Commonly known as: GLUCOPHAGE TAKE ONE TABLET BY MOUTH ONE TIME DAILY What changed: when to take this   sildenafil 20 MG tablet Commonly known as: REVATIO TAKE ONE TO THREE TABLETS BY MOUTH DAILY AS NEEDED   tamsulosin 0.4 MG Caps capsule Commonly known as: FLOMAX TAKE TWO CAPSULES BY MOUTH DAILY   testosterone cypionate 200 MG/ML injection Commonly known as: DEPOTESTOSTERONE CYPIONATE Inject 1 mL (200 mg total) into the muscle every 14 (fourteen) days.   tiZANidine 4 MG tablet Commonly known as: ZANAFLEX Take 1 tablet (4 mg total) by mouth every 6 (six) hours as needed for muscle spasms.   venlafaxine XR 150 MG 24 hr capsule Commonly known as: EFFEXOR-XR take 1 capsule by mouth one time daily with  breakfast What changed: See the new instructions.   Xtampza ER 13.5 MG C12a Generic drug: oxyCODONE ER Take 1 tablet by mouth every 12 (twelve) hours.       Diagnostic Studies: Dg Chest 2 View  Result Date: 02/08/2019 CLINICAL DATA:  Preoperative radiograph. EXAM: CHEST - 2 VIEW COMPARISON:  May 17, 2017 FINDINGS: Cardiomediastinal silhouette is normal. Mediastinal contours appear intact. There is no evidence of focal airspace consolidation, pleural effusion or pneumothorax. Thoracolumbar spine fusion noted.  Soft tissues are grossly normal. IMPRESSION: No active cardiopulmonary disease. Electronically Signed   By: Fidela Salisbury M.D.   On: 02/08/2019 11:50   Dg Thoracic Spine 4v  Result Date: 02/12/2019 CLINICAL DATA:  Thoracic spine decompression EXAM: THORACIC SPINE - 4+ VIEW; DG C-ARM 61-120 MIN COMPARISON:  None. FLUOROSCOPY TIME:  2 minutes 56 seconds FINDINGS: Multiple intraoperative fluoroscopic spot images are provided. Interval posterior thoracic spine fusion and decompression. IMPRESSION: Intraoperative localization. Electronically Signed   By: Kathreen Devoid   On: 02/12/2019 17:06   US Venous Img Lower Unilateral Left  Result Date: 01/22/2019 CLINICAL DATA:  Left lower extremity swelling EXAM: LEFT LOWER EXTREMITY VENOUS  DOPPLER ULTRASOUND TECHNIQUE: Gray-scale sonography with graded compression, as well as color Doppler and duplex ultrasound were performed to evaluate the lower extremity deep venous systems from the level of the common femoral vein and including the common femoral, femoral, profunda femoral, popliteal and calf veins including the posterior tibial, peroneal and gastrocnemius veins when visible. The superficial great saphenous vein was also interrogated. Spectral Doppler was utilized to evaluate flow at rest and with distal augmentation maneuvers in the common femoral, femoral and popliteal veins. COMPARISON:  None. FINDINGS: Contralateral Common Femoral Vein:  Respiratory phasicity is normal and symmetric with the symptomatic side. No evidence of thrombus. Normal compressibility. Common Femoral Vein: No evidence of thrombus. Normal compressibility, respiratory phasicity and response to augmentation. Saphenofemoral Junction: No evidence of thrombus. Normal compressibility and flow on color Doppler imaging. Profunda Femoral Vein: No evidence of thrombus. Normal compressibility and flow on color Doppler imaging. Femoral Vein: No evidence of thrombus. Normal compressibility, respiratory phasicity and response to augmentation. Popliteal Vein: No evidence of thrombus. Normal compressibility, respiratory phasicity and response to augmentation. Calf Veins: No evidence of thrombus. Normal compressibility and flow on color Doppler imaging. Superficial Great Saphenous Vein: No evidence of thrombus. Normal compressibility. Venous Reflux:  None. Other Findings:  None. IMPRESSION: No evidence of deep venous thrombosis. Electronically Signed   By: Inez Catalina M.D.   On: 01/22/2019 18:09   Dg C-arm 1-60 Min  Result Date: 02/12/2019 CLINICAL DATA:  Thoracic spine decompression EXAM: THORACIC SPINE - 4+ VIEW; DG C-ARM 61-120 MIN COMPARISON:  None. FLUOROSCOPY TIME:  2 minutes 56 seconds FINDINGS: Multiple intraoperative fluoroscopic spot images are provided. Interval posterior thoracic spine fusion and decompression. IMPRESSION: Intraoperative localization. Electronically Signed   By: Kathreen Devoid   On: 02/12/2019 17:06   Dg Knee 3 View Left  Result Date: 01/22/2019 CLINICAL DATA:  Lateral knee pain.  Worsening.  No injury. EXAM: LEFT KNEE - 3 VIEW COMPARISON:  No recent prior. FINDINGS: Tricompartment degenerative change with chondrocalcinosis. No acute bony abnormality. No evidence of fracture or dislocation. Peripheral vascular calcification. IMPRESSION: 1. Tricompartment degenerative change with chondrocalcinosis. No acute bony abnormality. 2.  Peripheral vascular disease.  Electronically Signed   By: Marcello Moores  Register   On: 01/22/2019 10:51    Discharge Instructions    Call MD / Call 911   Complete by: As directed    If you experience chest pain or shortness of breath, CALL 911 and be transported to the hospital emergency room.  If you develope a fever above 101 F, pus (white drainage) or increased drainage or redness at the wound, or calf pain, call your surgeon's office.   Constipation Prevention   Complete by: As directed    Drink plenty of fluids.  Prune juice may be helpful.  You may use a stool softener, such as Colace (over the counter) 100 mg twice a day.  Use MiraLax (over the counter) for constipation as needed.   Diet - low sodium heart healthy   Complete by: As directed    Discharge instructions   Complete by: As directed    Call if there is increasing drainage, fever greater than 101.5, severe head aches, and worsening nausea or light sensitivity. If shortness of breath, bloody cough or chest tightness or pain go to an emergency room. No lifting greater than 10 lbs. Avoid bending, stooping and twisting. Use brace when sitting and out of bed even to go to bathroom. Walk in house for first 2 weeks then may  start to get out slowly increasing distances up to one mile by 4-6 weeks post op. After 5 days may shower and change dressing following bathing with shower.When bathing remove the brace shower and replace brace before getting out of the shower. If drainage, keep dry dressing and do not bathe the incision, use an moisture impervious dressing. Please call and return for scheduled follow up appointment 2 weeks from the time of surgery.   Driving restrictions   Complete by: As directed    No driving for 6 weeks   Increase activity slowly as tolerated   Complete by: As directed    Lifting restrictions   Complete by: As directed    No lifting for 12 weeks      Follow-up Information    Jessy Oto, MD In 2 weeks.   Specialty: Orthopedic  Surgery Why: For wound re-check Contact information: [address] [place] [id number] [phone]           Discharge Plan:  discharge to home  Disposition:     Signed: Benjiman Core for [name] MD 02/18/2019, 2:29 PM

## 2019-02-18 NOTE — Telephone Encounter (Signed)
Dr. Louanne Skye gave him a lot of medication between the 2 narcotics and muscle relaxer.  I do not recommend doubling up his dose of Norco if not recommended by Dr. Louanne Skye.  Schedule follow-up with Dr. Louanne Skye tomorrow or Wednesday if he is in clinic.

## 2019-02-19 ENCOUNTER — Telehealth: Payer: Self-pay | Admitting: Radiology

## 2019-02-19 ENCOUNTER — Other Ambulatory Visit: Payer: Self-pay | Admitting: Family Medicine

## 2019-02-19 NOTE — Telephone Encounter (Signed)
From patient message: I haven't had a bowel movement since Sunday before surgery. I've taken a laxative twice, eaten a lot of prunes and had a lot of water. My stomach is very distended. I had a glycerin suppository and fleet mineral oil enema this afternoon with no results.

## 2019-02-19 NOTE — Telephone Encounter (Signed)
If he has done all of that without any relief then he needs to go to the emergency room for evaluation and to see if anything can be done there.

## 2019-02-19 NOTE — Telephone Encounter (Signed)
Sent message to patient

## 2019-02-20 ENCOUNTER — Ambulatory Visit (INDEPENDENT_AMBULATORY_CARE_PROVIDER_SITE_OTHER): Payer: Medicare Other | Admitting: Specialist

## 2019-02-20 ENCOUNTER — Ambulatory Visit (INDEPENDENT_AMBULATORY_CARE_PROVIDER_SITE_OTHER): Payer: Medicare Other

## 2019-02-20 ENCOUNTER — Encounter: Payer: Self-pay | Admitting: Specialist

## 2019-02-20 VITALS — BP 137/57 | HR 62 | Ht 70.0 in | Wt 202.0 lb

## 2019-02-20 DIAGNOSIS — M4325 Fusion of spine, thoracolumbar region: Secondary | ICD-10-CM

## 2019-02-20 MED ORDER — OXYCODONE ER 36 MG PO C12A: 1.0000 | EXTENDED_RELEASE_CAPSULE | Freq: Two times a day (BID) | ORAL | 0 refills | Status: DC

## 2019-02-20 MED ORDER — OXYCODONE-ACETAMINOPHEN 5-325 MG PO TABS: 1.0000 | ORAL_TABLET | ORAL | 0 refills | Status: DC | PRN

## 2019-02-20 MED ORDER — TIZANIDINE HCL 4 MG PO TABS: 4.0000 mg | ORAL_TABLET | Freq: Four times a day (QID) | ORAL | 0 refills | Status: DC | PRN

## 2019-02-20 NOTE — Progress Notes (Signed)
Post-Op Visit Note   Patient: Luis Brown           Date of Birth: [birth year]           MRN: [id number] Visit Date: 02/20/2019 PCP: Mackie Pai, PA-C   Assessment & Plan: 8 days post extension of fusion from T10 to T5 with decompressive laminectomy T8-9 and T9-10 acute right  Flank pain. Chief Complaint:  Chief Complaint  Patient presents with  . Lower Back - Routine Post Op  Exam: Motor both legs is normal. Sensation both legs is normal. SLR in normal.   Visit Diagnoses:  1. Fusion of spine of thoracolumbar region    Plan: Avoid frequent bending and stooping  No lifting greater than 10 lbs. May use ice or moist heat for pain. Weight loss is of benefit. Best medication for lumbar disc disease is arthritis medications like motrin, celebrex and naprosyn. Exercise is important to improve your indurance and does allow people to function better inspite of back pain.    Follow-Up Instructions: Return in about 1 week (around 02/27/2019) for overbook for removal of hardware..   Orders:  Orders Placed This Encounter  Procedures  . XR Lumbar Spine 2-3 Views   No orders of the defined types were placed in this encounter.   Imaging: No results found.  PMFS History: Patient Active Problem List   Diagnosis Date Noted  . Status post thoracic spinal fusion 02/12/2019  . 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 Thoracic spinal stenosis 04/17/2017  . Painful orthopaedic hardware ([place]) 04/17/2017  . 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 ([place]) 04/17/2017  . Low back pain radiating to both legs 01/16/2017  . Esophageal reflux 02/10/2015  . Medicare annual wellness visit, subsequent 02/10/2015  . Personal history of colonic polyps 10/27/2012  . 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  . BPH (benign prostatic hyperplasia) 04/23/2012  . ED (erectile dysfunction) 04/23/2012  . Allergic state  10/25/2011  . Anxiety and depression 10/25/2011  . Asthma   . Diabetes mellitus without complication ([place])   . Hyperlipidemia   . Overweight   . Insomnia   . Fatigue   . Preventative health care   . DDD (degenerative disc disease), lumbosacral   . Thoracic degenerative disc disease   . HTN (hypertension)    Past Medical History:  Diagnosis Date  . Acute pharyngitis 10/21/2013  . Allergy    grass and pollen  . Anxiety and depression 10/25/2011  . BPH (benign prostatic hyperplasia) 04/23/2012  . Chicken pox as a child  . DDD (degenerative disc disease)    cervical responds to steroid injections and low back required surgery  . DDD (degenerative disc disease), lumbosacral   . Diabetes mellitus    pre  . ED (erectile dysfunction) 04/23/2012  . Elevated BP   . Esophageal reflux 02/10/2015  . Fatigue   . HTN (hypertension)   . Hyperglycemia    preDM   . Hyperlipidemia   . Insomnia   . Low back pain radiating to both legs 01/16/2017  . Measles as a child  . Medicare annual wellness visit, subsequent 02/10/2015  . Overweight(278.02)   . Personal history of colonic polyps 10/27/2012   Follows with [facility]  . Preventative health care   . 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    Family History  Problem Relation Age of Onset  . Hypertension Mother   . Diabetes Mother  type 2  . Cancer Mother 71       breast in remission  . Emphysema Father        smoker  . COPD Father        smoker  . Stroke Father 71       mini  . Heart disease Father   . Hypertension Sister   . Hyperlipidemia Sister   . Scoliosis Sister   . Osteoporosis Sister   . Hypertension Maternal Grandmother   . Scoliosis Maternal Grandmother   . Heart disease Maternal Grandfather   . Heart disease Paternal Grandfather        smoker  . Heart disease Daughter     Past Surgical History:  Procedure Laterality Date  . BACK SURGERY  2012 and 1994   Dr Margret Chance, screws and cage in low back  . EYE  SURGERY Bilateral    2016  . LAMINECTOMY  02/12/2019   DECOMPRESSIVE LAMINECTOMY THORACIC NINE-THORACIC TEN AND THORACIC TEN-THORACIC ELEVEN, EXTENSION OF THORACOLUMBAR FUSION FROM THORACIC TEN TO THORACIC FIVE, LOCAL BONE GRAFT, ALLOGRAFT AND VIVIGEN (N/A)  . SPINAL FUSION N/A 02/12/2019   Procedure: DECOMPRESSIVE LAMINECTOMY THORACIC NINE-THORACIC TEN AND THORACIC TEN-THORACIC ELEVEN, EXTENSION OF THORACOLUMBAR FUSION FROM THORACIC TEN TO THORACIC FIVE, LOCAL BONE GRAFT, ALLOGRAFT AND VIVIGEN;  Surgeon: Jessy Oto, MD;  Location: [facility];  Service: Orthopedics;  Laterality: N/A;  DECOMPRESSIVE LAMINECTOMY THORACIC NINE-THORACIC TEN AND THORACIC TEN-THORACIC ELEVEN, EXTENSION OF TH  . TONSILLECTOMY    . TONSILLECTOMY     as child  . torn rotator cuff  2010   right   Social History   Occupational History  . Not on file  Tobacco Use  . Smoking status: Former Smoker    Packs/day: 1.00    Years: 20.00    Pack years: 20.00    Types: Cigarettes    Quit date: 07/18/1989    Years since quitting: 29.6  . Smokeless tobacco: Never Used  . Tobacco comment: off and on smoking for the 20 yrs  Substance and Sexual Activity  . Alcohol use: Yes    Comment: drink or two a day- mixed drink  . Drug use: No  . Sexual activity: Yes    Comment: lives with wife, still working, no dietary restrictions, continues to exercise intermittently

## 2019-02-20 NOTE — Patient Instructions (Addendum)
Plan: Avoid frequent bending and stooping  No lifting greater than 10 lbs. May use ice or moist heat for pain. Weight loss is of benefit. Best medication for lumbar disc disease is arthritis medications like motrin, celebrex and naprosyn. Exercise is important to improve your indurance and does allow people to function better inspite of back pain. Wear your brace when out of bed, on outside of your tee shirt. Increase oxycodone ER to 30 mg po every 12 hours and oxyIR every 3-4 hours.

## 2019-02-21 ENCOUNTER — Emergency Department (HOSPITAL_COMMUNITY)
Admission: EM | Admit: 2019-02-21 | Discharge: 2019-02-21 | Disposition: A | Discharge status: Home / Self Care | Payer: Medicare Other | Attending: Emergency Medicine | Admitting: Emergency Medicine

## 2019-02-21 ENCOUNTER — Telehealth: Payer: Self-pay | Admitting: Specialist

## 2019-02-21 ENCOUNTER — Other Ambulatory Visit: Payer: Self-pay

## 2019-02-21 ENCOUNTER — Encounter (HOSPITAL_COMMUNITY): Payer: Self-pay | Admitting: Emergency Medicine

## 2019-02-21 DIAGNOSIS — I10 Essential (primary) hypertension: Secondary | ICD-10-CM | POA: Insufficient documentation

## 2019-02-21 DIAGNOSIS — E119 Type 2 diabetes mellitus without complications: Secondary | ICD-10-CM | POA: Diagnosis not present

## 2019-02-21 DIAGNOSIS — T402X5A Adverse effect of other opioids, initial encounter: Secondary | ICD-10-CM | POA: Insufficient documentation

## 2019-02-21 DIAGNOSIS — R140 Abdominal distension (gaseous): Secondary | ICD-10-CM | POA: Diagnosis present

## 2019-02-21 DIAGNOSIS — J45909 Unspecified asthma, uncomplicated: Secondary | ICD-10-CM | POA: Insufficient documentation

## 2019-02-21 DIAGNOSIS — Z7982 Long term (current) use of aspirin: Secondary | ICD-10-CM | POA: Insufficient documentation

## 2019-02-21 DIAGNOSIS — K5903 Drug induced constipation: Secondary | ICD-10-CM | POA: Insufficient documentation

## 2019-02-21 DIAGNOSIS — Z87891 Personal history of nicotine dependence: Secondary | ICD-10-CM | POA: Diagnosis not present

## 2019-02-21 DIAGNOSIS — Z79899 Other long term (current) drug therapy: Secondary | ICD-10-CM | POA: Insufficient documentation

## 2019-02-21 DIAGNOSIS — Z7984 Long term (current) use of oral hypoglycemic drugs: Secondary | ICD-10-CM | POA: Insufficient documentation

## 2019-02-21 LAB — CBC WITH DIFFERENTIAL/PLATELET
Abs Immature Granulocytes: 0.03 10*3/uL (ref 0.00–0.07)
Basophils Absolute: 0.1 10*3/uL (ref 0.0–0.1)
Basophils Relative: 1 %
Eosinophils Absolute: 0.2 10*3/uL (ref 0.0–0.5)
Eosinophils Relative: 3 %
HCT: 35.6 % — ABNORMAL LOW (ref 39.0–52.0)
Hemoglobin: 11.4 g/dL — ABNORMAL LOW (ref 13.0–17.0)
Immature Granulocytes: 0 %
Lymphocytes Relative: 11 %
Lymphs Abs: 0.9 10*3/uL (ref 0.7–4.0)
MCH: 32.1 pg (ref 26.0–34.0)
MCHC: 32 g/dL (ref 30.0–36.0)
MCV: 100.3 fL — ABNORMAL HIGH (ref 80.0–100.0)
Monocytes Absolute: 0.9 10*3/uL (ref 0.1–1.0)
Monocytes Relative: 11 %
Neutro Abs: 6.2 10*3/uL (ref 1.7–7.7)
Neutrophils Relative %: 74 %
Platelets: 431 10*3/uL — ABNORMAL HIGH (ref 150–400)
RBC: 3.55 MIL/uL — ABNORMAL LOW (ref 4.22–5.81)
RDW: 14.9 % (ref 11.5–15.5)
WBC: 8.3 10*3/uL (ref 4.0–10.5)
nRBC: 0 % (ref 0.0–0.2)

## 2019-02-21 LAB — COMPREHENSIVE METABOLIC PANEL
ALT: 49 U/L — ABNORMAL HIGH (ref 0–44)
AST: 59 U/L — ABNORMAL HIGH (ref 15–41)
Albumin: 3.8 g/dL (ref 3.5–5.0)
Alkaline Phosphatase: 329 U/L — ABNORMAL HIGH (ref 38–126)
Anion gap: 10 (ref 5–15)
BUN: 14 mg/dL (ref 8–23)
CO2: 28 mmol/L (ref 22–32)
Calcium: 8.9 mg/dL (ref 8.9–10.3)
Chloride: 99 mmol/L (ref 98–111)
Creatinine, Ser: 0.8 mg/dL (ref 0.61–1.24)
GFR calc Af Amer: >60 mL/min (ref >60)
GFR calc non Af Amer: >60 mL/min (ref >60)
Glucose, Bld: 133 mg/dL — ABNORMAL HIGH (ref 70–99)
Potassium: 4 mmol/L (ref 3.5–5.1)
Sodium: 137 mmol/L (ref 135–145)
Total Bilirubin: 0.6 mg/dL (ref 0.3–1.2)
Total Protein: 6.9 g/dL (ref 6.5–8.1)

## 2019-02-21 MED ORDER — MILK AND MOLASSES ENEMA
1.0000 | Freq: Once | RECTAL | Status: AC
  Administered 2019-02-21: 240 mL via RECTAL
  Filled 2019-02-21: qty 240

## 2019-02-21 MED ORDER — HYDROCODONE-ACETAMINOPHEN 5-325 MG PO TABS
1.0000 | ORAL_TABLET | Freq: Once | ORAL | Status: AC
  Administered 2019-02-21: 1 via ORAL
  Filled 2019-02-21: qty 1

## 2019-02-21 NOTE — Telephone Encounter (Signed)
Patient left a voicemail message stating that he has not had a BM in 10 days.  He did take the medicine that was discussed yesterday and it did not work.  He wants to know what to do.  CB#[phone].  Thank you.

## 2019-02-21 NOTE — Telephone Encounter (Signed)
I called and lmom @ [phone] and at the number for his wife for him to go to the ER.  I will try to keep calling him

## 2019-02-21 NOTE — ED Provider Notes (Signed)
[place] DEPT Provider Note   CSN: [id number] Arrival date & time: 02/21/19  1722     History   Chief Complaint Chief Complaint  Patient presents with  . Constipation    HPI Luis Brown is a 73 y.o. male.  Resume underwent back surgery thoracic decompressive laminectomy.  Patient states he has not had bowel movement in 10 days.  States that he has noted some abdominal fullness, but no frank abdominal pain.  Has taken MiraLAX occasionally but not consistently.  Had tried fleets enema at home without success.  States that he has not had any nausea or vomiting.  Consistently passing gas.     HPI  Past Medical History:  Diagnosis Date  . Acute pharyngitis 10/21/2013  . Allergy    grass and pollen  . Anxiety and depression 10/25/2011  . BPH (benign prostatic hyperplasia) 04/23/2012  . Chicken pox as a child  . DDD (degenerative disc disease)    cervical responds to steroid injections and low back required surgery  . DDD (degenerative disc disease), lumbosacral   . Diabetes mellitus    pre  . ED (erectile dysfunction) 04/23/2012  . Elevated BP   . Esophageal reflux 02/10/2015  . Fatigue   . HTN (hypertension)   . Hyperglycemia    preDM   . Hyperlipidemia   . Insomnia   . Low back pain radiating to both legs 01/16/2017  . Measles as a child  . Medicare annual wellness visit, subsequent 02/10/2015  . Overweight(278.02)   . Personal history of colonic polyps 10/27/2012   Follows with [facility]  . Preventative health care   . 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    Patient Active Problem List   Diagnosis Date Noted  . Status post thoracic spinal fusion 02/12/2019  . 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 Thoracic spinal stenosis 04/17/2017  . Painful orthopaedic hardware ([place]) 04/17/2017  . 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 ([place]) 04/17/2017   . Low back pain radiating to both legs 01/16/2017  . Esophageal reflux 02/10/2015  . Medicare annual wellness visit, subsequent 02/10/2015  . Personal history of colonic polyps 10/27/2012  . 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  . BPH (benign prostatic hyperplasia) 04/23/2012  . ED (erectile dysfunction) 04/23/2012  . Allergic state 10/25/2011  . Anxiety and depression 10/25/2011  . Asthma   . Diabetes mellitus without complication ([place])   . Hyperlipidemia   . Overweight   . Insomnia   . Fatigue   . Preventative health care   . DDD (degenerative disc disease), lumbosacral   . Thoracic degenerative disc disease   . HTN (hypertension)     Past Surgical History:  Procedure Laterality Date  . BACK SURGERY  2012 and 1994   Dr Margret Chance, screws and cage in low back  . EYE SURGERY Bilateral    2016  . LAMINECTOMY  02/12/2019   DECOMPRESSIVE LAMINECTOMY THORACIC NINE-THORACIC TEN AND THORACIC TEN-THORACIC ELEVEN, EXTENSION OF THORACOLUMBAR FUSION FROM THORACIC TEN TO THORACIC FIVE, LOCAL BONE GRAFT, ALLOGRAFT AND VIVIGEN (N/A)  . SPINAL FUSION N/A 02/12/2019   Procedure: DECOMPRESSIVE LAMINECTOMY THORACIC NINE-THORACIC TEN AND THORACIC TEN-THORACIC ELEVEN, EXTENSION OF THORACOLUMBAR FUSION FROM THORACIC TEN TO THORACIC FIVE, LOCAL BONE GRAFT, ALLOGRAFT AND VIVIGEN;  Surgeon: Jessy Oto, MD;  Location: [place];  Service: Orthopedics;  Laterality: N/A;  DECOMPRESSIVE LAMINECTOMY THORACIC NINE-THORACIC TEN AND THORACIC TEN-THORACIC ELEVEN, EXTENSION OF TH  .  TONSILLECTOMY    . TONSILLECTOMY     as child  . torn rotator cuff  2010   right        Home Medications    Prior to Admission medications   Medication Sig Start Date End Date Taking? Authorizing Provider  aspirin 81 MG tablet Take 81 mg by mouth daily.    [provider]  atorvastatin (LIPITOR) 20 MG tablet TAKE 1/2 TABLET BY MOUTH DAILY AT 6 PM Patient taking differently: Take 10 mg by mouth every morning.  01/14/19    [name], MD  cetirizine (ZYRTEC) 10 MG tablet Take 10 mg by mouth every morning.    [provider]  diazepam (VALIUM) 10 MG tablet TAKE 1 TABLET(10 MG) BY MOUTH AT BEDTIME AS NEEDED Patient taking differently: Take 5 mg by mouth at bedtime as needed for sleep.  01/24/19   [name], MD  famotidine (PEPCID) 40 MG tablet TAKE 1 TABLET BY MOUTH AT BEDTIME 12/17/18   [name], MD  gabapentin (NEURONTIN) 300 MG capsule TAKE 1 CAPSULE BY MOUTH THREE TIMES A DAY Patient taking differently: Take 300 mg by mouth 3 (three) times daily.  02/11/19   Jessy Oto, MD  glucose blood (ONE TOUCH ULTRA TEST) test strip Check daily 04/26/12   [name], MD  lisinopril (ZESTRIL) 5 MG tablet Take 1 tablet (5 mg total) by mouth 2 (two) times daily. 11/02/18   [name], MD  metFORMIN (GLUCOPHAGE) 500 MG tablet TAKE ONE TABLET BY MOUTH ONE TIME DAILY  Patient taking differently: Take 500 mg by mouth 2 (two) times daily with a meal.  01/24/19   [name], MD  oxyCODONE ER [facility] ER) 13.5 MG C12A Take 1 tablet by mouth every 12 (twelve) hours. 02/14/19   Jessy Oto, MD  oxyCODONE ER 36 MG C12A Take 1 capsule (36 mg total) by mouth every 12 (twelve) hours for 7 days. 02/20/19 02/27/19  Jessy Oto, MD  oxyCODONE-acetaminophen (PERCOCET/ROXICET) 5-325 MG tablet Take 1-2 tablets by mouth every 4 (four) hours as needed for up to 7 days for severe pain. 02/20/19 02/27/19  Jessy Oto, MD  sildenafil (REVATIO) 20 MG tablet TAKE ONE TO THREE TABLETS BY MOUTH DAILY AS NEEDED Patient taking differently: Take 20 mg by mouth 3 (three) times daily as needed (erectile dysfunction).  12/17/18   [name], MD  tamsulosin (FLOMAX) 0.4 MG CAPS capsule TAKE TWO CAPSULES BY MOUTH DAILY  Patient taking differently: Take 0.8 mg by mouth daily.  02/20/19   Saguier, Percell Miller, PA-C  testosterone cypionate (DEPOTESTOSTERONE CYPIONATE) 200 MG/ML injection Inject 1 mL (200 mg total) into the muscle  every 14 (fourteen) days. 02/05/19   Saguier, Percell Miller, PA-C  tiZANidine (ZANAFLEX) 4 MG tablet Take 1 tablet (4 mg total) by mouth every 6 (six) hours as needed for muscle spasms. 02/20/19   Jessy Oto, MD  venlafaxine XR (EFFEXOR-XR) 150 MG 24 hr capsule take 1 capsule by mouth one time daily with breakfast Patient taking differently: Take 150 mg by mouth daily with breakfast.  01/28/19   Saguier, Percell Miller, PA-C    Family History Family History  Problem Relation Age of Onset  . Hypertension Mother   . Diabetes Mother        type 2  . Cancer Mother 68       breast in remission  . Emphysema Father        smoker  .  COPD Father        smoker  . Stroke Father 74       mini  . Heart disease Father   . Hypertension Sister   . Hyperlipidemia Sister   . Scoliosis Sister   . Osteoporosis Sister   . Hypertension Maternal Grandmother   . Scoliosis Maternal Grandmother   . Heart disease Maternal Grandfather   . Heart disease Paternal Grandfather        smoker  . Heart disease Daughter     Social History Social History   Tobacco Use  . Smoking status: Former Smoker    Packs/day: 1.00    Years: 20.00    Pack years: 20.00    Types: Cigarettes    Quit date: 07/18/1989    Years since quitting: 29.6  . Smokeless tobacco: Never Used  . Tobacco comment: off and on smoking for the 20 yrs  Substance Use Topics  . Alcohol use: Yes    Comment: drink or two a day- mixed drink  . Drug use: No     Allergies   Patient has no known allergies.   Review of Systems Review of Systems  Constitutional: Negative for chills and fever.  HENT: Negative for ear pain and sore throat.   Eyes: Negative for pain and visual disturbance.  Respiratory: Negative for cough and shortness of breath.   Cardiovascular: Negative for chest pain and palpitations.  Gastrointestinal: Positive for constipation. Negative for abdominal pain and vomiting.  Genitourinary: Negative for dysuria and hematuria.   Musculoskeletal: Negative for arthralgias and back pain.  Skin: Negative for color change and rash.  Neurological: Negative for seizures and syncope.  All other systems reviewed and are negative.    Physical Exam Updated Vital Signs BP (!) 147/66   Pulse 70   Temp 98.1 F (36.7 C)   Resp 20   SpO2 97%   Physical Exam Vitals signs and nursing note reviewed.  Constitutional:      Appearance: He is well-developed.  HENT:     Head: Normocephalic and atraumatic.  Eyes:     Conjunctiva/sclera: Conjunctivae normal.  Neck:     Musculoskeletal: Neck supple.  Cardiovascular:     Rate and Rhythm: Normal rate and regular rhythm.     Heart sounds: No murmur.  Pulmonary:     Effort: Pulmonary effort is normal. No respiratory distress.     Breath sounds: Normal breath sounds.  Abdominal:     Palpations: Abdomen is soft.     Tenderness: There is no abdominal tenderness.  Skin:    General: Skin is warm and dry.  Neurological:     Mental Status: He is alert.      ED Treatments / Results  Labs (all labs ordered are listed, but only abnormal results are displayed) Labs Reviewed  CBC WITH DIFFERENTIAL/PLATELET  COMPREHENSIVE METABOLIC PANEL    EKG None  Radiology Xr Lumbar Spine 2-3 Views  Result Date: 02/20/2019 AP and lateral radiographs of the thoracolumbar spine show the left T11-12 inter rod sleeve is loosened and there is mild Kyphosis at the junction of the thoracic hardware, retained and new hardware. The T10-11 disc is degenerated and has a vaacum sign. Right T9 pedicle screw is lateral to vertebra.    Procedures Procedures (including critical care time)  Medications Ordered in ED Medications - No data to display   Initial Impression / Assessment and Plan / ED Course  I have reviewed the triage vital signs and the  nursing notes.  Pertinent labs & imaging results that were available during my care of the patient were reviewed by me and considered in my medical  decision making (see chart for details).        73 year old gentleman history of chronic back pain, recent back surgery, significant opioid use presents with constipation.  Here patient was noted be well-appearing, normal vital signs, soft abdomen.  Passing gas, no associated vomiting, I doubt obstruction.  Provided patient enema and patient had large bowel movement while in the department.  Recommended aggressive MiraLAX regimen and follow-up with his primary doctor for ongoing management of his constipation.  Repeat precautions complete proper outpatient management this time, will discharge home.    After the discussed management above, the patient was determined to be safe for discharge.  The patient was in agreement with this plan and all questions regarding their care were answered.  ED return precautions were discussed and the patient will return to the ED with any significant worsening of condition.    Final Clinical Impressions(s) / ED Diagnoses   Final diagnoses:  Constipation due to opioid therapy    ED Discharge Orders    None       Lucrezia Starch, MD 02/21/19 2350

## 2019-02-21 NOTE — ED Triage Notes (Signed)
Patient c/o constipation. Last BM 7/27 prior to back surgery. Reports trying OTC meds, laxatives and enemas without relief. Denies N/V/D.

## 2019-02-21 NOTE — Telephone Encounter (Signed)
I called him again and he answered the phone and advised him to go to the ER  And he asked about going to an urgent care and I advised that they wouldn't be able to do much for him and that he needs to go to ER. He said ok.

## 2019-02-21 NOTE — Discharge Instructions (Addendum)
Tomorrow morning, recommend starting aggressive course of MiraLAX therapy, recommend taking 1 dose every 2 hours up to 5 doses until you have a bowel movement.  Recommend calling your primary doctor to schedule a follow-up appointment regarding ongoing management of your constipation.  Once you have this initial bowel movement, would recommend taking MiraLAX on a daily basis.

## 2019-02-23 ENCOUNTER — Other Ambulatory Visit: Payer: Self-pay | Admitting: Specialist

## 2019-02-25 ENCOUNTER — Encounter: Payer: Self-pay | Admitting: Specialist

## 2019-02-25 ENCOUNTER — Encounter: Payer: Self-pay | Admitting: Medical

## 2019-02-25 MED ORDER — XTAMPZA ER 13.5 MG PO C12A: 1.0000 | EXTENDED_RELEASE_CAPSULE | Freq: Two times a day (BID) | ORAL | 0 refills | Status: DC

## 2019-02-25 MED ORDER — OXYCODONE-ACETAMINOPHEN 5-325 MG PO TABS: 1.0000 | ORAL_TABLET | ORAL | 0 refills | Status: DC | PRN

## 2019-02-25 MED ORDER — CONTOUR NEXT TEST VI STRP: ORAL_STRIP | 12 refills | Status: DC

## 2019-02-27 ENCOUNTER — Encounter: Payer: Self-pay | Admitting: Specialist

## 2019-02-27 ENCOUNTER — Ambulatory Visit (INDEPENDENT_AMBULATORY_CARE_PROVIDER_SITE_OTHER): Payer: Medicare Other | Admitting: Specialist

## 2019-02-27 ENCOUNTER — Ambulatory Visit: Payer: Self-pay

## 2019-02-27 VITALS — BP 91/50 | HR 54 | Ht 70.0 in | Wt 202.0 lb

## 2019-02-27 DIAGNOSIS — M546 Pain in thoracic spine: Secondary | ICD-10-CM

## 2019-02-27 DIAGNOSIS — Z981 Arthrodesis status: Secondary | ICD-10-CM

## 2019-02-27 DIAGNOSIS — M40294 Other kyphosis, thoracic region: Secondary | ICD-10-CM | POA: Diagnosis not present

## 2019-02-27 DIAGNOSIS — T84498A Other mechanical complication of other internal orthopedic devices, implants and grafts, initial encounter: Secondary | ICD-10-CM | POA: Diagnosis not present

## 2019-02-27 MED ORDER — BACLOFEN 10 MG PO TABS: 10.0000 mg | ORAL_TABLET | Freq: Three times a day (TID) | ORAL | 0 refills | Status: DC

## 2019-02-27 NOTE — Patient Instructions (Signed)
Avoid bending, stooping and avoid lifting weights greater than 10 lbs. Avoid prolong standing and walking. Order for a new walker with wheels. Surgery scheduling secretary Kandice Hams, will call you in the next week to schedule for surgery.  Surgery recommended is a revision of the inter rod connection at the T9-10 level with reapplication of the inter rod fasteners and addition of a third rod to each side bridging this area of increased kyphosis. this would be done with rods with local bone graft and allograft (donor bone graft). Take oxycodone for for pain. Risk of surgery includes risk of infection 1 in 200 patients, bleeding 1/2% chance you would need a transfusion.   Risk to the nerves is one in 10,000. You will need to use a brace for 3 months and wean from the brace on the 4th month. Expect improved walking and standing tolerance. Expect relief of leg pain but numbness may persist depending on the length and degree of pressure that has been present.

## 2019-02-27 NOTE — Progress Notes (Signed)
Post-Op Visit Note   Patient: Luis Brown           Date of Birth: October 23, 1945           MRN: [id number] Visit Date: 02/27/2019 PCP: Mackie Pai, PA-C   Assessment & Plan:  Chief Complaint:  Chief Complaint  Patient presents with  . Lower Back - Routine Post Op   Visit Diagnoses:  1. Status post thoracic spinal fusion   2. Other kyphosis, thoracic region   3. Pain in thoracic spine   Incision with erythrema about the staples remaining, no drainage. Right lateral chest wall pain. Legs NV normal. Motor and sensory is normal. Better standing and walking tolerance.  Plan: Avoid bending, stooping and avoid lifting weights greater than 10 lbs. Avoid prolong standing and walking. Order for a new walker with wheels. Surgery scheduling secretary Kandice Hams, will call you in the next week to schedule for surgery.  Surgery recommended is a revision of the inter rod connection at the T9-10 level with reapplication of the inter rod fasteners and addition of a third rod to each side bridging this area of increased kyphosis. this would be done with rods with local bone graft and allograft (donor bone graft). Take oxycodone for for pain. Risk of surgery includes risk of infection 1 in 200 patients, bleeding 1/2% chance you would need a transfusion.   Risk to the nerves is one in 10,000. You will need to use a brace for 3 months and wean from the brace on the 4th month. Expect improved walking and standing tolerance. Expect relief of leg pain but numbness may persist depending on the length and degree of pressure that has been present.d.     Follow-Up Instructions: No follow-ups on file.   Orders:  No orders of the defined types were placed in this encounter.  No orders of the defined types were placed in this encounter.   Imaging: No results found.  PMFS History: Patient Active Problem List   Diagnosis Date Noted  . Status post thoracic spinal fusion 02/12/2019  .  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 Thoracic spinal stenosis 04/17/2017  . Painful orthopaedic hardware ([place]) 04/17/2017  . 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 ([place]) 04/17/2017  . Low back pain radiating to both legs 01/16/2017  . Esophageal reflux 02/10/2015  . Medicare annual wellness visit, subsequent 02/10/2015  . Personal history of colonic polyps 10/27/2012  . 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  . BPH (benign prostatic hyperplasia) 04/23/2012  . ED (erectile dysfunction) 04/23/2012  . Allergic state 10/25/2011  . Anxiety and depression 10/25/2011  . Asthma   . Diabetes mellitus without complication ([place])   . Hyperlipidemia   . Overweight   . Insomnia   . Fatigue   . Preventative health care   . DDD (degenerative disc disease), lumbosacral   . Thoracic degenerative disc disease   . HTN (hypertension)    Past Medical History:  Diagnosis Date  . Acute pharyngitis 10/21/2013  . Allergy    grass and pollen  . Anxiety and depression 10/25/2011  . BPH (benign prostatic hyperplasia) 04/23/2012  . Chicken pox as a child  . DDD (degenerative disc disease)    cervical responds to steroid injections and low back required surgery  . DDD (degenerative disc disease), lumbosacral   . Diabetes mellitus    pre  . ED (erectile dysfunction) 04/23/2012  . Elevated BP   .  Esophageal reflux 02/10/2015  . Fatigue   . HTN (hypertension)   . Hyperglycemia    preDM   . Hyperlipidemia   . Insomnia   . Low back pain radiating to both legs 01/16/2017  . Measles as a child  . Medicare annual wellness visit, subsequent 02/10/2015  . Overweight(278.02)   . Personal history of colonic polyps 10/27/2012   Follows with [facility]  . Preventative health care   . 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    Family History  Problem Relation Age of Onset  . Hypertension Mother   . Diabetes Mother         type 2  . Cancer Mother 92       breast in remission  . Emphysema Father        smoker  . COPD Father        smoker  . Stroke Father 59       mini  . Heart disease Father   . Hypertension Sister   . Hyperlipidemia Sister   . Scoliosis Sister   . Osteoporosis Sister   . Hypertension Maternal Grandmother   . Scoliosis Maternal Grandmother   . Heart disease Maternal Grandfather   . Heart disease Paternal Grandfather        smoker  . Heart disease Daughter     Past Surgical History:  Procedure Laterality Date  . BACK SURGERY  2012 and 1994   Dr Margret Chance, screws and cage in low back  . EYE SURGERY Bilateral    2016  . LAMINECTOMY  02/12/2019   DECOMPRESSIVE LAMINECTOMY THORACIC NINE-THORACIC TEN AND THORACIC TEN-THORACIC ELEVEN, EXTENSION OF THORACOLUMBAR FUSION FROM THORACIC TEN TO THORACIC FIVE, LOCAL BONE GRAFT, ALLOGRAFT AND VIVIGEN (N/A)  . SPINAL FUSION N/A 02/12/2019   Procedure: DECOMPRESSIVE LAMINECTOMY THORACIC NINE-THORACIC TEN AND THORACIC TEN-THORACIC ELEVEN, EXTENSION OF THORACOLUMBAR FUSION FROM THORACIC TEN TO THORACIC FIVE, LOCAL BONE GRAFT, ALLOGRAFT AND VIVIGEN;  Surgeon: Jessy Oto, MD;  Location: [facility];  Service: Orthopedics;  Laterality: N/A;  DECOMPRESSIVE LAMINECTOMY THORACIC NINE-THORACIC TEN AND THORACIC TEN-THORACIC ELEVEN, EXTENSION OF TH  . TONSILLECTOMY    . TONSILLECTOMY     as child  . torn rotator cuff  2010   right   Social History   Occupational History  . Not on file  Tobacco Use  . Smoking status: Former Smoker    Packs/day: 1.00    Years: 20.00    Pack years: 20.00    Types: Cigarettes    Quit date: 07/18/1989    Years since quitting: 29.6  . Smokeless tobacco: Never Used  . Tobacco comment: off and on smoking for the 20 yrs  Substance and Sexual Activity  . Alcohol use: Yes    Comment: drink or two a day- mixed drink  . Drug use: No  . Sexual activity: Yes    Comment: lives with wife, still working, no dietary restrictions,  continues to exercise intermittently

## 2019-02-28 ENCOUNTER — Encounter: Payer: Self-pay | Admitting: Specialist

## 2019-03-01 ENCOUNTER — Other Ambulatory Visit: Payer: Self-pay | Admitting: Specialist

## 2019-03-01 ENCOUNTER — Other Ambulatory Visit: Payer: Self-pay | Admitting: Radiology

## 2019-03-01 ENCOUNTER — Telehealth: Payer: Self-pay | Admitting: Specialist

## 2019-03-01 MED ORDER — OXYCODONE ER 36 MG PO C12A: 1.0000 | EXTENDED_RELEASE_CAPSULE | Freq: Two times a day (BID) | ORAL | 0 refills | Status: DC

## 2019-03-01 NOTE — Telephone Encounter (Signed)
Patient;s wife called in reference to the patient's Oxycodone.  She stated that the pharmacy will not fill it because it was too soon, but the patient is out because he has been taking 2 pills each time.  She wanted to know if there was something else that could be prescribed to him or can the RX for the oxycodone be rewritten to 2 pill every 12 hours.  Her number is [phone].  Thank you.

## 2019-03-01 NOTE — Telephone Encounter (Signed)
noted 

## 2019-03-01 NOTE — Telephone Encounter (Signed)
Can you have Dr. Lorin Mercy advise since Dr. Louanne Skye is in surgery?

## 2019-03-01 NOTE — Telephone Encounter (Signed)
I called discussed. Too early, reviewed [place] website, multiple Rx's

## 2019-03-01 NOTE — Telephone Encounter (Signed)
Can you advise? 

## 2019-03-02 MED ORDER — OXYCODONE-ACETAMINOPHEN 5-325 MG PO TABS: 1.0000 | ORAL_TABLET | ORAL | 0 refills | Status: DC | PRN

## 2019-03-04 ENCOUNTER — Encounter: Payer: Self-pay | Admitting: Specialist

## 2019-03-04 ENCOUNTER — Other Ambulatory Visit: Payer: Self-pay

## 2019-03-05 ENCOUNTER — Ambulatory Visit (INDEPENDENT_AMBULATORY_CARE_PROVIDER_SITE_OTHER): Payer: Medicare Other | Admitting: *Deleted

## 2019-03-05 DIAGNOSIS — E349 Endocrine disorder, unspecified: Secondary | ICD-10-CM

## 2019-03-05 MED ORDER — TESTOSTERONE CYPIONATE 200 MG/ML IM SOLN: 200.0000 mg | Freq: Once | INTRAMUSCULAR | Status: DC

## 2019-03-05 NOTE — Progress Notes (Addendum)
Patient here today for testosterone injection. He receives these injections every 2 weeks. 67mL testosterone given in patients right upper outter quadrant IM. Patient tolerated well.   Patient will call back for next a appointment.  Agree with testosterone injection for indication of low Testosterone.  Mackie Pai, PA-C

## 2019-03-06 ENCOUNTER — Encounter: Payer: Self-pay | Admitting: Specialist

## 2019-03-08 ENCOUNTER — Other Ambulatory Visit (HOSPITAL_COMMUNITY)
Admission: RE | Admit: 2019-03-08 | Discharge: 2019-03-08 | Disposition: A | Discharge status: Home / Self Care | Payer: Medicare Other | Source: Ambulatory Visit | Attending: Specialist | Admitting: Specialist

## 2019-03-08 ENCOUNTER — Other Ambulatory Visit: Payer: Self-pay | Admitting: Radiology

## 2019-03-08 ENCOUNTER — Telehealth: Payer: Self-pay | Admitting: Specialist

## 2019-03-08 ENCOUNTER — Encounter: Payer: Self-pay | Admitting: Specialist

## 2019-03-08 DIAGNOSIS — Z20828 Contact with and (suspected) exposure to other viral communicable diseases: Secondary | ICD-10-CM | POA: Diagnosis not present

## 2019-03-08 DIAGNOSIS — Z01812 Encounter for preprocedural laboratory examination: Secondary | ICD-10-CM | POA: Insufficient documentation

## 2019-03-08 LAB — SARS CORONAVIRUS 2 (TAT 6-24 HRS): SARS Coronavirus 2: NEGATIVE

## 2019-03-08 MED ORDER — OXYCODONE ER 36 MG PO C12A: 1.0000 | EXTENDED_RELEASE_CAPSULE | Freq: Two times a day (BID) | ORAL | 0 refills | Status: DC

## 2019-03-08 MED ORDER — BACLOFEN 10 MG PO TABS: 10.0000 mg | ORAL_TABLET | Freq: Three times a day (TID) | ORAL | 0 refills | Status: DC

## 2019-03-08 NOTE — Telephone Encounter (Signed)
Patient's wife sent in My Chart message requesting refills on Oxycodone 36mg  ER and Baclofen. Patient only has enough to get to 6pm tonight.  Medication refill request sent to Dr. Louanne Skye.

## 2019-03-08 NOTE — Telephone Encounter (Signed)
Patient left a message to f/u on his MyChart message and wanted to know if Dr. Louanne Skye will fill these RX before the weekend.  Please see the MyChart message.  CB#[phone].  Thank you.

## 2019-03-08 NOTE — Telephone Encounter (Signed)
Please advise. Thanks.  

## 2019-03-11 ENCOUNTER — Telehealth: Payer: Self-pay | Admitting: Medical

## 2019-03-11 ENCOUNTER — Telehealth: Payer: Self-pay | Admitting: Specialist

## 2019-03-11 ENCOUNTER — Encounter: Payer: Self-pay | Admitting: Specialist

## 2019-03-11 ENCOUNTER — Other Ambulatory Visit: Payer: Self-pay

## 2019-03-11 ENCOUNTER — Encounter (HOSPITAL_COMMUNITY): Payer: Self-pay | Admitting: *Deleted

## 2019-03-11 NOTE — Telephone Encounter (Signed)
Patient is calling because he is having back surgery with Dr. Jessica Priest.   The patient is requesting a script for a mouth wash and other medications needed before surgery.  Please advise CB- [phone]

## 2019-03-11 NOTE — Telephone Encounter (Signed)
Per Sherrie B. The hospital is supposed to be calling him. They are still making calls to patients for surgeries. She also discussed this with his wife earlier.

## 2019-03-11 NOTE — Progress Notes (Signed)
SDW-pre-op call completed by both pt and spouse, [name] (DPR). Pt denies any acute SOB. Pt denies chest pain. PCP - Mackie Pai, PA-C Cardiologist - denies  Chest x-ray - 02/08/19 EKG - 02/08/19 Stress Test -denies ECHO - denies Cardiac Cath - denies Recent labs- denies Sleep Study - denies Checks CBG's QOD; Fasting 120- 140 Pt stated that last dose of Aspirin was " 5 days ago " Spouse made aware to have pt stop taking Melatonin, vitamins, fish oil and herbal medications. Do not take any NSAIDs ie: Ibuprofen, Advil, Naproxen (Aleve), Motrin, BC and Goody Powder. Spouse made aware to have pt hold Metformin DOS.  Spouse made aware to have pt check CBG every 2 hours prior to arrival to hospital on DOS. Spouse made aware to have pt treat a CBG < 70 with  4 ounces of apple juice, wait 15 minutes after intervention to recheck BG, if BG remains < 70, call Short Stay unit to speak with a nurse. Spouse  verbalized understanding of instructions all pre-op instructions

## 2019-03-11 NOTE — Telephone Encounter (Signed)
Patient left a message stating that he is having surgery in the morning, it was a rush surgery so he is not sure what medications he is suppose to take and not suppose to take.  CB#[phone].  Thank you.

## 2019-03-12 ENCOUNTER — Inpatient Hospital Stay (HOSPITAL_COMMUNITY): Payer: Medicare Other | Admitting: Anesthesiology

## 2019-03-12 ENCOUNTER — Encounter (HOSPITAL_COMMUNITY)
Admission: RE | Disposition: A | Discharge status: Home / Self Care | Payer: Self-pay | Source: Home / Self Care | Attending: Specialist

## 2019-03-12 ENCOUNTER — Other Ambulatory Visit: Payer: Self-pay

## 2019-03-12 ENCOUNTER — Inpatient Hospital Stay (HOSPITAL_COMMUNITY): Payer: Medicare Other

## 2019-03-12 ENCOUNTER — Inpatient Hospital Stay (HOSPITAL_COMMUNITY)
Admission: RE | Admit: 2019-03-12 | Discharge: 2019-03-14 | DRG: 497 | Disposition: A | Discharge status: Home / Self Care | Payer: Medicare Other | Attending: Specialist | Admitting: Specialist

## 2019-03-12 ENCOUNTER — Encounter (HOSPITAL_COMMUNITY): Payer: Self-pay

## 2019-03-12 DIAGNOSIS — T84296A Other mechanical complication of internal fixation device of vertebrae, initial encounter: Secondary | ICD-10-CM | POA: Diagnosis not present

## 2019-03-12 DIAGNOSIS — Z8249 Family history of ischemic heart disease and other diseases of the circulatory system: Secondary | ICD-10-CM

## 2019-03-12 DIAGNOSIS — E785 Hyperlipidemia, unspecified: Secondary | ICD-10-CM | POA: Diagnosis not present

## 2019-03-12 DIAGNOSIS — E349 Endocrine disorder, unspecified: Secondary | ICD-10-CM

## 2019-03-12 DIAGNOSIS — N400 Enlarged prostate without lower urinary tract symptoms: Secondary | ICD-10-CM | POA: Diagnosis present

## 2019-03-12 DIAGNOSIS — F329 Major depressive disorder, single episode, unspecified: Secondary | ICD-10-CM | POA: Diagnosis present

## 2019-03-12 DIAGNOSIS — Z8349 Family history of other endocrine, nutritional and metabolic diseases: Secondary | ICD-10-CM

## 2019-03-12 DIAGNOSIS — K219 Gastro-esophageal reflux disease without esophagitis: Secondary | ICD-10-CM | POA: Diagnosis not present

## 2019-03-12 DIAGNOSIS — T84498A Other mechanical complication of other internal orthopedic devices, implants and grafts, initial encounter: Secondary | ICD-10-CM

## 2019-03-12 DIAGNOSIS — Z79890 Hormone replacement therapy (postmenopausal): Secondary | ICD-10-CM | POA: Diagnosis not present

## 2019-03-12 DIAGNOSIS — Z981 Arthrodesis status: Secondary | ICD-10-CM

## 2019-03-12 DIAGNOSIS — E119 Type 2 diabetes mellitus without complications: Secondary | ICD-10-CM | POA: Diagnosis not present

## 2019-03-12 DIAGNOSIS — Z7984 Long term (current) use of oral hypoglycemic drugs: Secondary | ICD-10-CM | POA: Diagnosis not present

## 2019-03-12 DIAGNOSIS — I10 Essential (primary) hypertension: Secondary | ICD-10-CM | POA: Diagnosis not present

## 2019-03-12 DIAGNOSIS — Z87891 Personal history of nicotine dependence: Secondary | ICD-10-CM

## 2019-03-12 DIAGNOSIS — T84038A Mechanical loosening of other internal prosthetic joint, initial encounter: Principal | ICD-10-CM | POA: Diagnosis present

## 2019-03-12 DIAGNOSIS — Z91048 Other nonmedicinal substance allergy status: Secondary | ICD-10-CM

## 2019-03-12 DIAGNOSIS — R7303 Prediabetes: Secondary | ICD-10-CM | POA: Diagnosis not present

## 2019-03-12 DIAGNOSIS — M4325 Fusion of spine, thoracolumbar region: Secondary | ICD-10-CM

## 2019-03-12 DIAGNOSIS — Z809 Family history of malignant neoplasm, unspecified: Secondary | ICD-10-CM | POA: Diagnosis not present

## 2019-03-12 DIAGNOSIS — F419 Anxiety disorder, unspecified: Secondary | ICD-10-CM | POA: Diagnosis present

## 2019-03-12 DIAGNOSIS — Z7982 Long term (current) use of aspirin: Secondary | ICD-10-CM

## 2019-03-12 DIAGNOSIS — Z8701 Personal history of pneumonia (recurrent): Secondary | ICD-10-CM

## 2019-03-12 DIAGNOSIS — Z833 Family history of diabetes mellitus: Secondary | ICD-10-CM | POA: Diagnosis not present

## 2019-03-12 DIAGNOSIS — Z79899 Other long term (current) drug therapy: Secondary | ICD-10-CM | POA: Diagnosis not present

## 2019-03-12 DIAGNOSIS — Z419 Encounter for procedure for purposes other than remedying health state, unspecified: Secondary | ICD-10-CM

## 2019-03-12 HISTORY — PX: SPINAL FUSION: SHX223

## 2019-03-12 HISTORY — DX: Pneumonia, unspecified organism: J18.9

## 2019-03-12 HISTORY — PX: HARDWARE REVISION: SHX5845

## 2019-03-12 HISTORY — DX: Dyspnea, unspecified: R06.00

## 2019-03-12 HISTORY — DX: Presence of spectacles and contact lenses: Z97.3

## 2019-03-12 LAB — CBC
HCT: 38.7 % — ABNORMAL LOW (ref 39.0–52.0)
Hemoglobin: 12.3 g/dL — ABNORMAL LOW (ref 13.0–17.0)
MCH: 30.8 pg (ref 26.0–34.0)
MCHC: 31.8 g/dL (ref 30.0–36.0)
MCV: 97 fL (ref 80.0–100.0)
Platelets: 430 10*3/uL — ABNORMAL HIGH (ref 150–400)
RBC: 3.99 MIL/uL — ABNORMAL LOW (ref 4.22–5.81)
RDW: 14.4 % (ref 11.5–15.5)
WBC: 9.4 10*3/uL (ref 4.0–10.5)
nRBC: 0 % (ref 0.0–0.2)

## 2019-03-12 LAB — COMPREHENSIVE METABOLIC PANEL
ALT: 11 U/L (ref 0–44)
AST: 21 U/L (ref 15–41)
Albumin: 3.8 g/dL (ref 3.5–5.0)
Alkaline Phosphatase: 97 U/L (ref 38–126)
Anion gap: 11 (ref 5–15)
BUN: 8 mg/dL (ref 8–23)
CO2: 24 mmol/L (ref 22–32)
Calcium: 9.1 mg/dL (ref 8.9–10.3)
Chloride: 102 mmol/L (ref 98–111)
Creatinine, Ser: 0.91 mg/dL (ref 0.61–1.24)
GFR calc Af Amer: >60 mL/min (ref >60)
GFR calc non Af Amer: >60 mL/min (ref >60)
Glucose, Bld: 171 mg/dL — ABNORMAL HIGH (ref 70–99)
Potassium: 3.7 mmol/L (ref 3.5–5.1)
Sodium: 137 mmol/L (ref 135–145)
Total Bilirubin: 0.9 mg/dL (ref 0.3–1.2)
Total Protein: 6.4 g/dL — ABNORMAL LOW (ref 6.5–8.1)

## 2019-03-12 LAB — URINALYSIS, ROUTINE W REFLEX MICROSCOPIC
Bilirubin Urine: NEGATIVE
Glucose, UA: NEGATIVE mg/dL
Hgb urine dipstick: NEGATIVE
Ketones, ur: NEGATIVE mg/dL
Leukocytes,Ua: NEGATIVE
Nitrite: NEGATIVE
Protein, ur: NEGATIVE mg/dL
Specific Gravity, Urine: 1.016 (ref 1.005–1.030)
pH: 6 (ref 5.0–8.0)

## 2019-03-12 LAB — GLUCOSE, CAPILLARY
Glucose-Capillary: 158 mg/dL — ABNORMAL HIGH (ref 70–99)
Glucose-Capillary: 147 mg/dL — ABNORMAL HIGH (ref 70–99)
Glucose-Capillary: 160 mg/dL — ABNORMAL HIGH (ref 70–99)
Glucose-Capillary: 153 mg/dL — ABNORMAL HIGH (ref 70–99)

## 2019-03-12 LAB — HEMOGLOBIN A1C
Hgb A1c MFr Bld: 6.4 % — ABNORMAL HIGH (ref 4.8–5.6)
Hgb A1c MFr Bld: 6.5 % — ABNORMAL HIGH (ref 4.8–5.6)
Mean Plasma Glucose: 136.98 mg/dL
Mean Plasma Glucose: 139.85 mg/dL

## 2019-03-12 LAB — APTT: aPTT: 32 seconds (ref 24–36)

## 2019-03-12 LAB — PROTIME-INR
INR: 1.1 (ref 0.8–1.2)
Prothrombin Time: 14.1 seconds (ref 11.4–15.2)

## 2019-03-12 SURGERY — FUSION, SPINE, 2 OR MORE LEVELS, POSTERIOR APPROACH
Anesthesia: General

## 2019-03-12 MED ORDER — 0.9 % SODIUM CHLORIDE (POUR BTL) OPTIME
TOPICAL | Status: DC | PRN
  Administered 2019-03-12 (×2): 1000 mL

## 2019-03-12 MED ORDER — ONDANSETRON HCL 4 MG/2ML IJ SOLN
INTRAMUSCULAR | Status: AC
  Filled 2019-03-12: qty 6

## 2019-03-12 MED ORDER — MELATONIN 3 MG PO TABS
9.0000 mg | ORAL_TABLET | Freq: Every evening | ORAL | Status: DC | PRN
  Administered 2019-03-13: 21:00:00 9 mg via ORAL
  Filled 2019-03-12 (×2): qty 3

## 2019-03-12 MED ORDER — SODIUM CHLORIDE 0.9% FLUSH
3.0000 mL | Freq: Two times a day (BID) | INTRAVENOUS | Status: DC
  Administered 2019-03-12 – 2019-03-14 (×5): 3 mL via INTRAVENOUS

## 2019-03-12 MED ORDER — METHOCARBAMOL 500 MG PO TABS
500.0000 mg | ORAL_TABLET | Freq: Four times a day (QID) | ORAL | Status: DC | PRN
  Administered 2019-03-12: 500 mg via ORAL
  Filled 2019-03-12: qty 1

## 2019-03-12 MED ORDER — PANTOPRAZOLE SODIUM 40 MG IV SOLR
40.0000 mg | Freq: Every day | INTRAVENOUS | Status: DC
  Administered 2019-03-12: 40 mg via INTRAVENOUS
  Filled 2019-03-12: qty 40

## 2019-03-12 MED ORDER — FAMOTIDINE 40 MG PO TABS
40.0000 mg | ORAL_TABLET | Freq: Every day | ORAL | Status: DC
  Administered 2019-03-12 – 2019-03-13 (×2): 40 mg via ORAL
  Filled 2019-03-12 (×2): qty 1

## 2019-03-12 MED ORDER — HYDROMORPHONE HCL 1 MG/ML IJ SOLN
1.0000 mg | INTRAMUSCULAR | Status: DC | PRN
  Administered 2019-03-12 – 2019-03-13 (×6): 1 mg via INTRAVENOUS
  Filled 2019-03-12 (×6): qty 1

## 2019-03-12 MED ORDER — BUPIVACAINE-EPINEPHRINE (PF) 0.5% -1:200000 IJ SOLN
INTRAMUSCULAR | Status: AC
  Filled 2019-03-12: qty 30

## 2019-03-12 MED ORDER — MIDAZOLAM HCL 2 MG/2ML IJ SOLN: 0.5000 mg | Freq: Once | INTRAMUSCULAR | Status: DC | PRN

## 2019-03-12 MED ORDER — TESTOSTERONE CYPIONATE 200 MG/ML IM SOLN: 200.0000 mg | INTRAMUSCULAR | Status: DC

## 2019-03-12 MED ORDER — HYDROMORPHONE HCL 1 MG/ML IJ SOLN
0.2500 mg | INTRAMUSCULAR | Status: DC | PRN
  Administered 2019-03-12 (×3): 0.5 mg via INTRAVENOUS

## 2019-03-12 MED ORDER — DEXAMETHASONE SODIUM PHOSPHATE 10 MG/ML IJ SOLN
INTRAMUSCULAR | Status: DC | PRN
  Administered 2019-03-12: 10 mg via INTRAVENOUS

## 2019-03-12 MED ORDER — INSULIN ASPART 100 UNIT/ML ~~LOC~~ SOLN
0.0000 [IU] | Freq: Three times a day (TID) | SUBCUTANEOUS | Status: DC
  Administered 2019-03-12: 3 [IU] via SUBCUTANEOUS
  Administered 2019-03-13: 2 [IU] via SUBCUTANEOUS
  Administered 2019-03-13 (×2): 1 [IU] via SUBCUTANEOUS

## 2019-03-12 MED ORDER — METHOCARBAMOL 1000 MG/10ML IJ SOLN
500.0000 mg | Freq: Four times a day (QID) | INTRAVENOUS | Status: DC | PRN
  Filled 2019-03-12: qty 5

## 2019-03-12 MED ORDER — ALBUMIN HUMAN 5 % IV SOLN
INTRAVENOUS | Status: DC | PRN
  Administered 2019-03-12: 11:00:00 via INTRAVENOUS

## 2019-03-12 MED ORDER — HYDROCODONE-ACETAMINOPHEN 10-325 MG PO TABS
2.0000 | ORAL_TABLET | ORAL | Status: DC | PRN
  Administered 2019-03-12 – 2019-03-14 (×6): 2 via ORAL
  Filled 2019-03-12 (×7): qty 2

## 2019-03-12 MED ORDER — HYDROMORPHONE HCL 1 MG/ML IJ SOLN
INTRAMUSCULAR | Status: DC | PRN
  Administered 2019-03-12: .5 mg via INTRAVENOUS

## 2019-03-12 MED ORDER — CHLORHEXIDINE GLUCONATE 4 % EX LIQD: 60.0000 mL | Freq: Once | CUTANEOUS | Status: DC

## 2019-03-12 MED ORDER — HYDROMORPHONE HCL 1 MG/ML IJ SOLN
INTRAMUSCULAR | Status: AC
  Filled 2019-03-12: qty 1

## 2019-03-12 MED ORDER — LORATADINE 10 MG PO TABS
10.0000 mg | ORAL_TABLET | Freq: Every day | ORAL | Status: DC
  Administered 2019-03-13 – 2019-03-14 (×2): 10 mg via ORAL
  Filled 2019-03-12 (×2): qty 1

## 2019-03-12 MED ORDER — PHENOL 1.4 % MT LIQD: 1.0000 | OROMUCOSAL | Status: DC | PRN

## 2019-03-12 MED ORDER — PROPOFOL 10 MG/ML IV BOLUS
INTRAVENOUS | Status: DC | PRN
  Administered 2019-03-12: 125 mg via INTRAVENOUS

## 2019-03-12 MED ORDER — BACLOFEN 5 MG HALF TABLET
10.0000 mg | ORAL_TABLET | Freq: Three times a day (TID) | ORAL | Status: DC
  Administered 2019-03-12 – 2019-03-14 (×6): 10 mg via ORAL
  Filled 2019-03-12 (×6): qty 2

## 2019-03-12 MED ORDER — METHOCARBAMOL 500 MG PO TABS
ORAL_TABLET | ORAL | Status: AC
  Filled 2019-03-12: qty 1

## 2019-03-12 MED ORDER — BUPIVACAINE HCL (PF) 0.5 % IJ SOLN
INTRAMUSCULAR | Status: DC | PRN
  Administered 2019-03-12: 30 mL

## 2019-03-12 MED ORDER — ROCURONIUM BROMIDE 50 MG/5ML IV SOSY
PREFILLED_SYRINGE | INTRAVENOUS | Status: DC | PRN
  Administered 2019-03-12: 10 mg via INTRAVENOUS
  Administered 2019-03-12: 20 mg via INTRAVENOUS
  Administered 2019-03-12: 60 mg via INTRAVENOUS
  Administered 2019-03-12: 10 mg via INTRAVENOUS

## 2019-03-12 MED ORDER — BISACODYL 5 MG PO TBEC: 5.0000 mg | DELAYED_RELEASE_TABLET | Freq: Every day | ORAL | Status: DC | PRN

## 2019-03-12 MED ORDER — DOCUSATE SODIUM 100 MG PO CAPS
100.0000 mg | ORAL_CAPSULE | Freq: Two times a day (BID) | ORAL | Status: DC
  Administered 2019-03-12 – 2019-03-14 (×4): 100 mg via ORAL
  Filled 2019-03-12 (×4): qty 1

## 2019-03-12 MED ORDER — HYDROCODONE-ACETAMINOPHEN 10-325 MG PO TABS
1.0000 | ORAL_TABLET | ORAL | Status: DC | PRN
  Administered 2019-03-13: 1 via ORAL
  Filled 2019-03-12: qty 1

## 2019-03-12 MED ORDER — ACETAMINOPHEN 325 MG PO TABS: 650.0000 mg | ORAL_TABLET | ORAL | Status: DC | PRN

## 2019-03-12 MED ORDER — PHENYLEPHRINE HCL (PRESSORS) 10 MG/ML IV SOLN
INTRAVENOUS | Status: DC | PRN
  Administered 2019-03-12 (×2): 80 ug via INTRAVENOUS
  Administered 2019-03-12: 40 ug via INTRAVENOUS

## 2019-03-12 MED ORDER — ASPIRIN 81 MG PO CHEW
81.0000 mg | CHEWABLE_TABLET | Freq: Every day | ORAL | Status: DC
  Administered 2019-03-12 – 2019-03-14 (×3): 81 mg via ORAL
  Filled 2019-03-12 (×3): qty 1

## 2019-03-12 MED ORDER — SODIUM CHLORIDE 0.9 % IV SOLN
INTRAVENOUS | Status: DC | PRN
  Administered 2019-03-12: 25 ug/min via INTRAVENOUS

## 2019-03-12 MED ORDER — MIDAZOLAM HCL 2 MG/2ML IJ SOLN
INTRAMUSCULAR | Status: AC
  Filled 2019-03-12: qty 2

## 2019-03-12 MED ORDER — BUPIVACAINE HCL (PF) 0.5 % IJ SOLN
INTRAMUSCULAR | Status: AC
  Filled 2019-03-12: qty 30

## 2019-03-12 MED ORDER — FENTANYL CITRATE (PF) 100 MCG/2ML IJ SOLN
INTRAMUSCULAR | Status: DC | PRN
  Administered 2019-03-12 (×3): 50 ug via INTRAVENOUS
  Administered 2019-03-12: 100 ug via INTRAVENOUS

## 2019-03-12 MED ORDER — POLYETHYLENE GLYCOL 3350 17 G PO PACK: 17.0000 g | PACK | Freq: Every day | ORAL | Status: DC | PRN

## 2019-03-12 MED ORDER — LIDOCAINE 2% (20 MG/ML) 5 ML SYRINGE
INTRAMUSCULAR | Status: DC | PRN
  Administered 2019-03-12: 40 mg via INTRAVENOUS

## 2019-03-12 MED ORDER — BUPIVACAINE LIPOSOME 1.3 % IJ SUSP
20.0000 mL | Freq: Once | INTRAMUSCULAR | Status: DC
  Filled 2019-03-12: qty 20

## 2019-03-12 MED ORDER — TIZANIDINE HCL 4 MG PO TABS
4.0000 mg | ORAL_TABLET | Freq: Four times a day (QID) | ORAL | Status: DC | PRN
  Administered 2019-03-13: 4 mg via ORAL
  Filled 2019-03-12: qty 1

## 2019-03-12 MED ORDER — PROMETHAZINE HCL 25 MG/ML IJ SOLN: 6.2500 mg | INTRAMUSCULAR | Status: DC | PRN

## 2019-03-12 MED ORDER — THROMBIN 20000 UNITS EX SOLR
CUTANEOUS | Status: AC
  Filled 2019-03-12: qty 20000

## 2019-03-12 MED ORDER — LISINOPRIL 5 MG PO TABS
5.0000 mg | ORAL_TABLET | Freq: Two times a day (BID) | ORAL | Status: DC
  Administered 2019-03-12 – 2019-03-14 (×4): 5 mg via ORAL
  Filled 2019-03-12 (×4): qty 1

## 2019-03-12 MED ORDER — THROMBIN 20000 UNITS EX SOLR
CUTANEOUS | Status: DC | PRN
  Administered 2019-03-12: 20 mL via TOPICAL

## 2019-03-12 MED ORDER — PROPOFOL 10 MG/ML IV BOLUS
INTRAVENOUS | Status: AC
  Filled 2019-03-12: qty 20

## 2019-03-12 MED ORDER — TAMSULOSIN HCL 0.4 MG PO CAPS
0.8000 mg | ORAL_CAPSULE | Freq: Every day | ORAL | Status: DC
  Administered 2019-03-13 – 2019-03-14 (×2): 0.8 mg via ORAL
  Filled 2019-03-12 (×2): qty 2

## 2019-03-12 MED ORDER — VENLAFAXINE HCL ER 150 MG PO CP24
150.0000 mg | ORAL_CAPSULE | Freq: Every day | ORAL | Status: DC
  Administered 2019-03-13 – 2019-03-14 (×2): 150 mg via ORAL
  Filled 2019-03-12 (×2): qty 1

## 2019-03-12 MED ORDER — ONDANSETRON HCL 4 MG/2ML IJ SOLN: 4.0000 mg | Freq: Four times a day (QID) | INTRAMUSCULAR | Status: DC | PRN

## 2019-03-12 MED ORDER — ONDANSETRON HCL 4 MG PO TABS: 4.0000 mg | ORAL_TABLET | Freq: Four times a day (QID) | ORAL | Status: DC | PRN

## 2019-03-12 MED ORDER — FLEET ENEMA 7-19 GM/118ML RE ENEM: 1.0000 | ENEMA | Freq: Once | RECTAL | Status: DC | PRN

## 2019-03-12 MED ORDER — CEFAZOLIN SODIUM-DEXTROSE 2-4 GM/100ML-% IV SOLN
2.0000 g | Freq: Three times a day (TID) | INTRAVENOUS | Status: AC
  Administered 2019-03-12 (×2): 2 g via INTRAVENOUS
  Filled 2019-03-12 (×2): qty 100

## 2019-03-12 MED ORDER — MEPERIDINE HCL 25 MG/ML IJ SOLN: 6.2500 mg | INTRAMUSCULAR | Status: DC | PRN

## 2019-03-12 MED ORDER — GABAPENTIN 300 MG PO CAPS
300.0000 mg | ORAL_CAPSULE | Freq: Three times a day (TID) | ORAL | Status: DC
  Administered 2019-03-12 – 2019-03-14 (×6): 300 mg via ORAL
  Filled 2019-03-12 (×6): qty 1

## 2019-03-12 MED ORDER — ALUM & MAG HYDROXIDE-SIMETH 200-200-20 MG/5ML PO SUSP: 30.0000 mL | Freq: Four times a day (QID) | ORAL | Status: DC | PRN

## 2019-03-12 MED ORDER — SODIUM CHLORIDE 0.9 % IV SOLN
250.0000 mL | INTRAVENOUS | Status: DC
  Administered 2019-03-12: 16:00:00 250 mL via INTRAVENOUS

## 2019-03-12 MED ORDER — HYDROMORPHONE HCL 1 MG/ML IJ SOLN
INTRAMUSCULAR | Status: AC
  Filled 2019-03-12: qty 0.5

## 2019-03-12 MED ORDER — LIDOCAINE 2% (20 MG/ML) 5 ML SYRINGE
INTRAMUSCULAR | Status: AC
  Filled 2019-03-12: qty 10

## 2019-03-12 MED ORDER — ACETAMINOPHEN 650 MG RE SUPP: 650.0000 mg | RECTAL | Status: DC | PRN

## 2019-03-12 MED ORDER — SODIUM CHLORIDE 0.9% FLUSH: 3.0000 mL | INTRAVENOUS | Status: DC | PRN

## 2019-03-12 MED ORDER — MIDAZOLAM HCL 5 MG/5ML IJ SOLN
INTRAMUSCULAR | Status: DC | PRN
  Administered 2019-03-12: 2 mg via INTRAVENOUS

## 2019-03-12 MED ORDER — SUGAMMADEX SODIUM 200 MG/2ML IV SOLN
INTRAVENOUS | Status: DC | PRN
  Administered 2019-03-12: 200 mg via INTRAVENOUS

## 2019-03-12 MED ORDER — SODIUM CHLORIDE 0.9 % IV SOLN
INTRAVENOUS | Status: DC
  Administered 2019-03-12: 18:00:00 via INTRAVENOUS

## 2019-03-12 MED ORDER — MENTHOL 3 MG MT LOZG: 1.0000 | LOZENGE | OROMUCOSAL | Status: DC | PRN

## 2019-03-12 MED ORDER — ROCURONIUM BROMIDE 10 MG/ML (PF) SYRINGE
PREFILLED_SYRINGE | INTRAVENOUS | Status: AC
  Filled 2019-03-12: qty 10

## 2019-03-12 MED ORDER — DEXAMETHASONE SODIUM PHOSPHATE 10 MG/ML IJ SOLN
INTRAMUSCULAR | Status: AC
  Filled 2019-03-12: qty 3

## 2019-03-12 MED ORDER — LACTATED RINGERS IV SOLN
INTRAVENOUS | Status: DC | PRN
  Administered 2019-03-12 (×3): via INTRAVENOUS

## 2019-03-12 MED ORDER — DEXMEDETOMIDINE HCL IN NACL 80 MCG/20ML IV SOLN
INTRAVENOUS | Status: AC
  Filled 2019-03-12: qty 20

## 2019-03-12 MED ORDER — FENTANYL CITRATE (PF) 250 MCG/5ML IJ SOLN
INTRAMUSCULAR | Status: AC
  Filled 2019-03-12: qty 5

## 2019-03-12 MED ORDER — ATORVASTATIN CALCIUM 10 MG PO TABS
10.0000 mg | ORAL_TABLET | Freq: Every day | ORAL | Status: DC
  Administered 2019-03-12 – 2019-03-13 (×2): 10 mg via ORAL
  Filled 2019-03-12 (×2): qty 1

## 2019-03-12 MED ORDER — BUPIVACAINE LIPOSOME 1.3 % IJ SUSP
INTRAMUSCULAR | Status: DC | PRN
  Administered 2019-03-12: 20 mL

## 2019-03-12 MED ORDER — CEFAZOLIN SODIUM-DEXTROSE 2-4 GM/100ML-% IV SOLN
2.0000 g | INTRAVENOUS | Status: AC
  Administered 2019-03-12: 10:00:00 2 g via INTRAVENOUS
  Filled 2019-03-12: qty 100

## 2019-03-12 MED ORDER — DIAZEPAM 5 MG PO TABS
5.0000 mg | ORAL_TABLET | Freq: Three times a day (TID) | ORAL | Status: DC | PRN
  Administered 2019-03-12: 5 mg via ORAL
  Filled 2019-03-12: qty 1

## 2019-03-12 SURGICAL SUPPLY — 82 items
ADH SKN CLS APL DERMABOND .7 (GAUZE/BANDAGES/DRESSINGS) ×2
BLADE CLIPPER SURG (BLADE) ×1 IMPLANT
BLADE SURG 15 STRL LF DISP TIS (BLADE) IMPLANT
BLADE SURG 15 STRL SS (BLADE)
BONE VIVIGEN FORMABLE 10CC (Bone Implant) ×2 IMPLANT
BUR RND FLUTED 2.5 (BURR) ×2 IMPLANT
BUR SABER RD CUTTING 3.0 (BURR) IMPLANT
BUR SURG 4X8 MED (BURR) IMPLANT
BURR SURG 4X8 MED (BURR)
CONNECTOR EXPEDIUM TI 55MM (Connector) ×10 IMPLANT
CORD BIPOLAR FORCEPS 12FT (ELECTRODE) ×2 IMPLANT
COVER BACK TABLE 80X110 HD (DRAPES) ×2 IMPLANT
COVER MAYO STAND STRL (DRAPES) IMPLANT
COVER SURGICAL LIGHT HANDLE (MISCELLANEOUS) ×1 IMPLANT
COVER WAND RF STERILE (DRAPES) ×2 IMPLANT
DERMABOND ADVANCED (GAUZE/BANDAGES/DRESSINGS) ×2
DERMABOND ADVANCED .7 DNX12 (GAUZE/BANDAGES/DRESSINGS) ×1 IMPLANT
DRAPE C-ARM 42X72 X-RAY (DRAPES) ×2 IMPLANT
DRAPE HALF SHEET 40X57 (DRAPES) ×3 IMPLANT
DRAPE INCISE IOBAN 66X45 STRL (DRAPES) ×2 IMPLANT
DRAPE LAPAROTOMY 100X72X124 (DRAPES) ×2 IMPLANT
DRAPE MICROSCOPE LEICA (MISCELLANEOUS) IMPLANT
DRAPE SURG 17X23 STRL (DRAPES) ×8 IMPLANT
DRSG MEPILEX BORDER 4X12 (GAUZE/BANDAGES/DRESSINGS) ×2 IMPLANT
DRSG MEPILEX BORDER 4X4 (GAUZE/BANDAGES/DRESSINGS) ×1 IMPLANT
DRSG MEPILEX BORDER 4X8 (GAUZE/BANDAGES/DRESSINGS) IMPLANT
DRSG PAD ABDOMINAL 8X10 ST (GAUZE/BANDAGES/DRESSINGS) ×2 IMPLANT
DURAPREP 26ML APPLICATOR (WOUND CARE) ×2 IMPLANT
ELECT BLADE 6.5 EXT (BLADE) IMPLANT
ELECT CAUTERY BLADE 6.4 (BLADE) ×2 IMPLANT
ELECT REM PT RETURN 9FT ADLT (ELECTROSURGICAL) ×2
ELECTRODE REM PT RTRN 9FT ADLT (ELECTROSURGICAL) ×1 IMPLANT
EVACUATOR 1/8 PVC DRAIN (DRAIN) IMPLANT
GAUZE SPONGE 4X4 12PLY STRL (GAUZE/BANDAGES/DRESSINGS) ×2 IMPLANT
GLOVE BIOGEL PI IND STRL 6.5 (GLOVE) ×1 IMPLANT
GLOVE BIOGEL PI IND STRL 8 (GLOVE) ×1 IMPLANT
GLOVE BIOGEL PI INDICATOR 6.5 (GLOVE) ×1
GLOVE BIOGEL PI INDICATOR 8 (GLOVE) ×4
GLOVE ECLIPSE 9.0 STRL (GLOVE) ×2 IMPLANT
GLOVE ORTHO TXT STRL SZ7.5 (GLOVE) ×2 IMPLANT
GLOVE SURG 8.5 LATEX PF (GLOVE) ×2 IMPLANT
GLOVE SURG SS PI 7.0 STRL IVOR (GLOVE) ×6 IMPLANT
GLOVE SURG SS PI 7.5 STRL IVOR (GLOVE) ×8 IMPLANT
GOWN STRL REUS W/ TWL LRG LVL3 (GOWN DISPOSABLE) ×1 IMPLANT
GOWN STRL REUS W/TWL 2XL LVL3 (GOWN DISPOSABLE) ×4 IMPLANT
GOWN STRL REUS W/TWL LRG LVL3 (GOWN DISPOSABLE) ×8
HEMOSTAT SURGICEL 2X14 (HEMOSTASIS) IMPLANT
KIT BASIN OR (CUSTOM PROCEDURE TRAY) ×2 IMPLANT
KIT TURNOVER KIT B (KITS) ×2 IMPLANT
MANIFOLD NEPTUNE II (INSTRUMENTS) IMPLANT
NEEDLE 22X1 1/2 (OR ONLY) (NEEDLE) ×2 IMPLANT
NEEDLE HYPO 21X1.5 SAFETY (NEEDLE) ×2 IMPLANT
NS IRRIG 1000ML POUR BTL (IV SOLUTION) ×3 IMPLANT
PACK LAMINECTOMY NEURO (CUSTOM PROCEDURE TRAY) ×1 IMPLANT
PACK LAMINECTOMY ORTHO (CUSTOM PROCEDURE TRAY) ×1 IMPLANT
PAD ARMBOARD 7.5X6 YLW CONV (MISCELLANEOUS) ×2 IMPLANT
PATTIES SURGICAL .75X.75 (GAUZE/BANDAGES/DRESSINGS) ×1 IMPLANT
ROD SPINAL CERV COCH 3.5X420 (Rod) ×2 IMPLANT
ROD SPINAL EXP LVL1 5.5X480 (Rod) ×1 IMPLANT
SCREW SET SINGLE INNER (Screw) ×10 IMPLANT
SPONGE LAP 4X18 RFD (DISPOSABLE) ×2 IMPLANT
SPONGE SURGIFOAM ABS GEL 100 (HEMOSTASIS) ×1 IMPLANT
STAPLER SKIN PROX WIDE 3.9 (STAPLE) ×1 IMPLANT
SUT BONE WAX W31G (SUTURE) IMPLANT
SUT VIC AB 0 CT1 27 (SUTURE) ×8
SUT VIC AB 0 CT1 27XBRD ANBCTR (SUTURE) ×1 IMPLANT
SUT VIC AB 1 CTX 36 (SUTURE) ×6
SUT VIC AB 1 CTX36XBRD ANBCTR (SUTURE) ×3 IMPLANT
SUT VIC AB 2-0 CT1 27 (SUTURE) ×2
SUT VIC AB 2-0 CT1 TAPERPNT 27 (SUTURE) ×1 IMPLANT
SUT VICRYL 4-0 PS2 18IN ABS (SUTURE) ×2 IMPLANT
SWAB CULTURE LIQ STUART DBL (MISCELLANEOUS) ×1 IMPLANT
SWAB CULTURE LIQUID MINI MALE (MISCELLANEOUS) ×1 IMPLANT
SYR 30ML LL (SYRINGE) ×2 IMPLANT
SYR CONTROL 10ML LL (SYRINGE) ×2 IMPLANT
TAP CANN VIPER2 DL 6.0 (TAP) ×2 IMPLANT
TAP CANN VIPER2 DL 7.0 (TAP) ×2 IMPLANT
TOWEL GREEN STERILE (TOWEL DISPOSABLE) ×2 IMPLANT
TOWEL GREEN STERILE FF (TOWEL DISPOSABLE) ×2 IMPLANT
TRAY FOLEY MTR SLVR 16FR STAT (SET/KITS/TRAYS/PACK) ×1 IMPLANT
WATER STERILE IRR 1000ML POUR (IV SOLUTION) ×2 IMPLANT
YANKAUER SUCT BULB TIP NO VENT (SUCTIONS) ×2 IMPLANT

## 2019-03-12 NOTE — H&P (Signed)
PREOPERATIVE H&P  Chief Complaint: loosening of hardware interrod connectors T10-T11  HPI: Luis Brown is a 73 y.o. male who presents for preoperative history and physical with a diagnosis of loosening of hardware interrod connectors T10-T11. Symptoms are rated as moderate to severe, and have been worsening.  This is significantly impairing activities of daily living.  He has elected for surgical management. Patient is status post central laminectomy T8-9 and T9-10 with extension of thoracic fusion for T10 to T5. He has had pain in the right parathoracic area immediately post op but was able to transition to independence and discharge on POD#3  Nearly 3 weeks ago. Returned to the office at one week post op with  Persistent right paradorsal pain and plain radiographs demontrated the loss of fixation of the right interrod fastener.Prescribed narcotic medications recommended use of the thoracolumbar brace. At 2 weeks post op increased pain, seen in the ER. Plain radiographs show loss of fixation of the opposite left inter rod fastener. He has persisted with severe back pain since developing acute loosening of the inter rod fasteners and due to persistent use of strong narcotics and radiographic appearance of loss of fixation I recommend returning to the OR for revision of the hardware with longer rods, probable parallel rods and additional fixation to obtain a more ridgid stable construct. No bowel or bladder difficulty. He relates the preoperative bilateral leg weakness is much improved as is balance and coordination secondary to decompression of the thoracic spine in the area of severe stenosis T8-9 and T9-10.  Past Medical History:  Diagnosis Date  . Acute pharyngitis 10/21/2013  . Allergy    grass and pollen  . Anxiety and depression 10/25/2011  . BPH (benign prostatic hyperplasia) 04/23/2012  . Chicken pox as a child  . DDD (degenerative disc disease)    cervical responds to steroid injections and  low back required surgery  . DDD (degenerative disc disease), lumbosacral   . Diabetes mellitus    pre  . Dyspnea   . ED (erectile dysfunction) 04/23/2012  . Elevated BP   . Esophageal reflux 02/10/2015  . Fatigue   . HTN (hypertension)   . Hyperglycemia    preDM   . Hyperlipidemia   . Insomnia   . Low back pain radiating to both legs 01/16/2017  . Measles as a child  . Medicare annual wellness visit, subsequent 02/10/2015  . Overweight(278.02)   . Personal history of colonic polyps 10/27/2012   Follows with [facility]  . Pneumonia    " walking"  . Preventative health care   . 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  . Wears glasses    Past Surgical History:  Procedure Laterality Date  . BACK SURGERY  2012 and 1994   Dr Margret Chance, screws and cage in low back  . EYE SURGERY Bilateral    2016  . LAMINECTOMY  02/12/2019   DECOMPRESSIVE LAMINECTOMY THORACIC NINE-THORACIC TEN AND THORACIC TEN-THORACIC ELEVEN, EXTENSION OF THORACOLUMBAR FUSION FROM THORACIC TEN TO THORACIC FIVE, LOCAL BONE GRAFT, ALLOGRAFT AND VIVIGEN (N/A)  . SPINAL FUSION N/A 02/12/2019   Procedure: DECOMPRESSIVE LAMINECTOMY THORACIC NINE-THORACIC TEN AND THORACIC TEN-THORACIC ELEVEN, EXTENSION OF THORACOLUMBAR FUSION FROM THORACIC TEN TO THORACIC FIVE, LOCAL BONE GRAFT, ALLOGRAFT AND VIVIGEN;  Surgeon: Jessy Oto, MD;  Location: [place];  Service: Orthopedics;  Laterality: N/A;  DECOMPRESSIVE LAMINECTOMY THORACIC NINE-THORACIC TEN AND THORACIC TEN-THORACIC ELEVEN, EXTENSION OF TH  . TONSILLECTOMY    . TONSILLECTOMY     as child  .  torn rotator cuff  2010   right   Social History   Socioeconomic History  . Marital status: Married    Spouse name: Not on file  . Number of children: Not on file  . Years of education: Not on file  . Highest education level: Not on file  Occupational History  . Not on file  Social Needs  . Financial resource strain: Not on file  . Food insecurity    Worry: Not on file     Inability: Not on file  . Transportation needs    Medical: Not on file    Non-medical: Not on file  Tobacco Use  . Smoking status: Former Smoker    Packs/day: 1.00    Years: 20.00    Pack years: 20.00    Types: Cigarettes    Quit date: 07/18/1989    Years since quitting: 29.6  . Smokeless tobacco: Never Used  . Tobacco comment: off and on smoking for the 20 yrs  Substance and Sexual Activity  . Alcohol use: Yes    Comment: drink or two a day- mixed drink  . Drug use: No  . Sexual activity: Yes    Comment: lives with wife, still working, no dietary restrictions, continues to exercise intermittently  Lifestyle  . Physical activity    Days per week: Not on file    Minutes per session: Not on file  . Stress: Not on file  Relationships  . Social Herbalist on phone: Not on file    Gets together: Not on file    Attends religious service: Not on file    Active member of club or organization: Not on file    Attends meetings of clubs or organizations: Not on file    Relationship status: Not on file  Other Topics Concern  . Not on file  Social History Narrative  . Not on file   Family History  Problem Relation Age of Onset  . Hypertension Mother   . Diabetes Mother        type 2  . Cancer Mother 15       breast in remission  . Emphysema Father        smoker  . COPD Father        smoker  . Stroke Father 53       mini  . Heart disease Father   . Hypertension Sister   . Hyperlipidemia Sister   . Scoliosis Sister   . Osteoporosis Sister   . Hypertension Maternal Grandmother   . Scoliosis Maternal Grandmother   . Heart disease Maternal Grandfather   . Heart disease Paternal Grandfather        smoker  . Heart disease Daughter    No Known Allergies Prior to Admission medications   Medication Sig Start Date End Date Taking? Authorizing Provider  aspirin 81 MG tablet Take 81 mg by mouth daily.   Yes [provider]  atorvastatin (LIPITOR) 20 MG tablet  TAKE 1/2 TABLET BY MOUTH DAILY AT 6 PM Patient taking differently: Take 10 mg by mouth daily at 6 PM.  01/14/19  Yes [name], MD  baclofen (LIORESAL) 10 MG tablet Take 1 tablet (10 mg total) by mouth 3 (three) times daily. 03/08/19  Yes Jessy Oto, MD  cetirizine (ZYRTEC) 10 MG tablet Take 10 mg by mouth daily. In the morning   Yes [provider]  diazepam (VALIUM) 10 MG tablet TAKE 1 TABLET(10  MG) BY MOUTH AT BEDTIME AS NEEDED Patient taking differently: Take 5 mg by mouth at bedtime as needed for sleep.  01/24/19  Yes [name], MD  gabapentin (NEURONTIN) 300 MG capsule TAKE 1 CAPSULE BY MOUTH THREE TIMES A DAY Patient taking differently: Take 300 mg by mouth 3 (three) times daily.  02/11/19  Yes Jessy Oto, MD  lisinopril (ZESTRIL) 5 MG tablet Take 1 tablet (5 mg total) by mouth 2 (two) times daily. 11/02/18  Yes [name], MD  Melatonin 10 MG TABS Take 10 mg by mouth at bedtime as needed (sleep).    Yes [provider]  metFORMIN (GLUCOPHAGE) 500 MG tablet TAKE ONE TABLET BY MOUTH ONE TIME DAILY  Patient taking differently: Take 500 mg by mouth 2 (two) times daily with a meal.  01/24/19  Yes [name], MD  oxyCODONE ER 36 MG C12A Take 1 capsule (36 mg total) by mouth every 12 (twelve) hours for 7 days. 03/08/19 03/15/19 Yes Jessy Oto, MD  oxyCODONE-acetaminophen (PERCOCET/ROXICET) 5-325 MG tablet Take 1-2 tablets by mouth every 4 (four) hours as needed for up to 7 days for severe pain. 03/02/19 03/09/19 Yes Jessy Oto, MD  tamsulosin (FLOMAX) 0.4 MG CAPS capsule TAKE TWO CAPSULES BY MOUTH DAILY  Patient taking differently: Take 0.8 mg by mouth at bedtime.  02/20/19  Yes Saguier, Percell Miller, PA-C  testosterone cypionate (DEPOTESTOSTERONE CYPIONATE) 200 MG/ML injection Inject 1 mL (200 mg total) into the muscle every 14 (fourteen) days. 02/05/19  Yes Saguier, Percell Miller, PA-C  tiZANidine (ZANAFLEX) 4 MG tablet Take 1 tablet (4 mg total) by mouth every 6 (six)  hours as needed for muscle spasms. 02/20/19  Yes Jessy Oto, MD  venlafaxine XR (EFFEXOR-XR) 150 MG 24 hr capsule take 1 capsule by mouth one time daily with breakfast Patient taking differently: Take 150 mg by mouth daily with breakfast.  01/28/19  Yes Saguier, Percell Miller, PA-C  famotidine (PEPCID) 40 MG tablet TAKE 1 TABLET BY MOUTH AT BEDTIME Patient taking differently: Take 40 mg by mouth at bedtime.  12/17/18   [name], MD  glucose blood (CONTOUR NEXT TEST) test strip Check blood sugar once daily 02/25/19   Saguier, Percell Miller, PA-C  sildenafil (REVATIO) 20 MG tablet TAKE ONE TO THREE TABLETS BY MOUTH DAILY AS NEEDED Patient not taking: No sig reported 12/17/18   [name], MD     Positive ROS: All other systems have been reviewed and were otherwise negative with the exception of those mentioned in the HPI and as above.  Physical Exam: General: Alert, no acute distress Cardiovascular: No pedal edema Respiratory: No cyanosis, no use of accessory musculature GI: No organomegaly, abdomen is soft and non-tender Skin: No lesions in the area of chief complaint Neurologic: Sensation intact distally Psychiatric: Patient is competent for consent with normal mood and affect Lymphatic: No axillary or cervical lymphadenopathy  MUSCULOSKELETAL: Incision is healed with some mild erythrema, no fluctuance or drainage or open wounds. Motor both legs is normal. SLR negative. Sensory is normal both legs. Plain radiographs with pedicle screws and rods fixing the thoracolumbar spine from T5 to S1, there is increased kyphosis at the T9-T10 area with loss of fixation of the inter rod sleeve fasteners at this level, No loosening of pedicle screws, vacuum sign T10-11.  Assessment: loosening of hardware interrod connectors T10-T11  Plan: Plan for Procedure(s): REVISION OF HARDWARE 99991111 WITH APPLICATION OF ADDITIONAL RODS AND ROD SLEEVES T8-T12-L1  The  risks benefits and alternatives were discussed  with the patient including but not limited to the risks of nonoperative treatment, versus surgical intervention including infection, bleeding, nerve injury,  blood clots, cardiopulmonary complications, morbidity, mortality, among others, and they were willing to proceed.   [name], MD Cell [phone] Office [phone] 03/12/2019 9:26 AM

## 2019-03-12 NOTE — Discharge Instructions (Addendum)
° ° °  Call if there is increasing drainage, fever greater than 101.5, severe head aches, and °worsening nausea or light sensitivity. °If shortness of breath, bloody cough or chest tightness or pain go to an emergency room. °No lifting greater than 10 lbs. °Avoid bending, stooping and twisting. °Use brace when sitting and out of bed even to go to bathroom. °Walk in house for first 2 weeks then may start to get out slowly increasing distances up to one mile by 4-6 weeks post op. °After 5 days may shower and change dressing following bathing with shower.When °bathing remove the brace shower and replace brace before getting out of the shower. °If drainage, keep dry dressing and do not bathe the incision, use an moisture impervious dressing. °Please call and return for scheduled follow up appointment 2 weeks from the time of surgery. ° ° °

## 2019-03-12 NOTE — Brief Op Note (Signed)
03/12/2019  1:04 PM  PATIENT:  Luis Brown  73 y.o. male  PRE-OPERATIVE DIAGNOSIS:  loosening of hardware interrod connectors Thoracic ten-Thoracic eleven  POST-OPERATIVE DIAGNOSIS:  loosening of hardware interrod connectors Thoracic ten-Thoracic eleven  PROCEDURE:  Procedure(s): REVISION OF HARDWARE THORACIC TEN-THORACIC ELEVEN WITH APPLICATION OF ADDITIONAL RODS AND ROD SLEEVES THORACIC EIGHT-THORACIC TWELVE 12-LUMBAR ONE (N/A)  SURGEON:  Surgeon(s) and Role:    * Jessy Oto, MD - Primary  PHYSICIAN ASSISTANT: Benjiman Core, PA-C   ANESTHESIA:   local and general, Dr. Glennon Mac  EBL:  450 mL   BLOOD ADMINISTERED:none  DRAINS: Urinary Catheter (Foley)   LOCAL MEDICATIONS USED:  MARCAIINE0.5% 1:1 EXPAREL 1.3% Amount: 30 ml  SPECIMEN:  No Specimen  DISPOSITION OF SPECIMEN:  N/A  COUNTS:  YES  TOURNIQUET:  * No tourniquets in log *  DICTATION: .Dragon Dictation  PLAN OF CARE: Admit to inpatient   PATIENT DISPOSITION:  PACU - hemodynamically stable.   Delay start of Pharmacological VTE agent (>24hrs) due to surgical blood loss or risk of bleeding:

## 2019-03-12 NOTE — Anesthesia Postprocedure Evaluation (Signed)
Anesthesia Post Note  Patient: Luis Brown  Procedure(s) Performed: REVISION OF HARDWARE THORACIC TEN-THORACIC ELEVEN WITH APPLICATION OF ADDITIONAL RODS AND ROD SLEEVES THORACIC EIGHT-THORACIC TWELVE 12-LUMBAR ONE (N/A )     Patient location during evaluation: PACU Anesthesia Type: General Level of consciousness: awake and alert, patient cooperative and oriented Pain management: pain level controlled Vital Signs Assessment: post-procedure vital signs reviewed and stable Respiratory status: spontaneous breathing, nonlabored ventilation, respiratory function stable and patient connected to nasal cannula oxygen Cardiovascular status: blood pressure returned to baseline and stable Postop Assessment: no apparent nausea or vomiting Anesthetic complications: no    Last Vitals:  Vitals:   03/12/19 1424 03/12/19 1427  BP:  137/68  Pulse: 98 99  Resp: (!) 9 15  Temp:    SpO2: 99% 99%    Last Pain:  Vitals:   03/12/19 1424  TempSrc:   PainSc: 5     LLE Motor Response: Purposeful movement;Responds to commands (03/12/19 1424) LLE Sensation: Full sensation (03/12/19 1424) RLE Motor Response: Purposeful movement;Responds to commands (03/12/19 1424) RLE Sensation: Full sensation (03/12/19 1424)      Brytnee Bechler,E. [name]

## 2019-03-12 NOTE — Anesthesia Preprocedure Evaluation (Addendum)
Anesthesia Evaluation  Patient identified by MRN, date of birth, ID band Patient awake    Reviewed: Allergy & Precautions, NPO status , Patient's Chart, lab work & pertinent test results  History of Anesthesia Complications Negative for: history of anesthetic complications  Airway Mallampati: II  TM Distance: >3 FB Neck ROM: Full    Dental  (+) Dental Advisory Given, Caps, Missing   Pulmonary former smoker,  03/08/2019 SARS coronavirus NEG   breath sounds clear to auscultation       Cardiovascular hypertension, Pt. on medications (-) angina Rhythm:Regular Rate:Normal     Neuro/Psych Anxiety Depression DDD: narcotics    GI/Hepatic Neg liver ROS, GERD  Medicated and Controlled,  Endo/Other  diabetes (glu 171), Oral Hypoglycemic Agents  Renal/GU negative Renal ROS     Musculoskeletal   Abdominal (+) - obese,   Peds  Hematology negative hematology ROS (+)   Anesthesia Other Findings   Reproductive/Obstetrics                            Anesthesia Physical Anesthesia Plan  ASA: III  Anesthesia Plan: General   Post-op Pain Management:    Induction: Intravenous  PONV Risk Score and Plan: 3 and Ondansetron, Dexamethasone and Treatment may vary due to age or medical condition  Airway Management Planned: Oral ETT  Additional Equipment:   Intra-op Plan:   Post-operative Plan: Extubation in OR  Informed Consent: I have reviewed the patients History and Physical, chart, labs and discussed the procedure including the risks, benefits and alternatives for the proposed anesthesia with the patient or authorized representative who has indicated his/her understanding and acceptance.     Dental advisory given  Plan Discussed with: CRNA and Surgeon  Anesthesia Plan Comments:        Anesthesia Quick Evaluation

## 2019-03-12 NOTE — Progress Notes (Signed)
PT Cancellation Note  Patient Details Name: Luis Brown MRN: [id number] DOB: Jun 02, 1946   Cancelled Treatment:    Reason Eval/Treat Not Completed: Pain limiting ability to participate Pt refusing, stating he is in too much pain to participate. Will follow up as schedule allows.   Leighton Ruff, PT, DPT  Acute Rehabilitation Services  Pager: [phone] Office: [phone]  Rudean Hitt 03/12/2019, 5:13 PM

## 2019-03-12 NOTE — Progress Notes (Signed)
Care of pt assumed by MA Albirtha Grinage RN 

## 2019-03-12 NOTE — Progress Notes (Signed)
Orthopedic Tech Progress Note Patient Details:  Luis Brown 1945/10/28 [id number] Patient has brace Patient ID: LYNNOX BOYKO, male   DOB: 1945-07-23, 73 y.o.   MRN: [id number]   Janit Pagan 03/12/2019, 3:04 PM

## 2019-03-12 NOTE — Interval H&P Note (Signed)
History and Physical Interval Note:  03/12/2019 9:37 AM  Luis Brown  has presented today for surgery, with the diagnosis of loosening of hardware interrod connectors T10-T11.  The various methods of treatment have been discussed with the patient and family. After consideration of risks, benefits and other options for treatment, the patient has consented to  Procedure(s): REVISION OF HARDWARE 99991111 WITH APPLICATION OF ADDITIONAL RODS AND ROD SLEEVES T8-T12-L1 (N/A) as a surgical intervention.  The patient's history has been reviewed, patient examined, no change in status, stable for surgery.  I have reviewed the patient's chart and labs.  Questions were answered to the patient's satisfaction.     [name]

## 2019-03-12 NOTE — Anesthesia Procedure Notes (Signed)
Procedure Name: Intubation Date/Time: 03/12/2019 9:48 AM Performed by: Neldon Newport, CRNA Pre-anesthesia Checklist: Timeout performed, Patient being monitored, Suction available, Emergency Drugs available and Patient identified Patient Re-evaluated:Patient Re-evaluated prior to induction Oxygen Delivery Method: Circle system utilized Preoxygenation: Pre-oxygenation with 100% oxygen Induction Type: IV induction Ventilation: Mask ventilation without difficulty Laryngoscope Size: Mac and 3 Grade View: Grade II Tube type: Oral Tube size: 7.5 mm Number of attempts: 1 Placement Confirmation: breath sounds checked- equal and bilateral,  positive ETCO2 and ETT inserted through vocal cords under direct vision Secured at: 22 cm Tube secured with: Tape Dental Injury: Teeth and Oropharynx as per pre-operative assessment

## 2019-03-12 NOTE — Transfer of Care (Signed)
Immediate Anesthesia Transfer of Care Note  Patient: PRYNCETON HOLTZINGER  Procedure(s) Performed: REVISION OF HARDWARE THORACIC TEN-THORACIC ELEVEN WITH APPLICATION OF ADDITIONAL RODS AND ROD SLEEVES THORACIC EIGHT-THORACIC TWELVE 12-LUMBAR ONE (N/A )  Patient Location: PACU  Anesthesia Type:General  Level of Consciousness: awake, alert  and oriented  Airway & Oxygen Therapy: Patient Spontanous Breathing  Post-op Assessment: Report given to RN, Post -op Vital signs reviewed and stable and Patient moving all extremities X 4  Post vital signs: Reviewed and stable  Last Vitals:  Vitals Value Taken Time  BP 163/91 03/12/19 1329  Temp    Pulse 110 03/12/19 1329  Resp 12 03/12/19 1329  SpO2 100 % 03/12/19 1329  Vitals shown include unvalidated device data.  Last Pain:  Vitals:   03/12/19 0708  TempSrc:   PainSc: 3       Patients Stated Pain Goal: 2 ([id number])  Complications: No apparent anesthesia complications

## 2019-03-13 ENCOUNTER — Other Ambulatory Visit: Payer: Self-pay | Admitting: Specialist

## 2019-03-13 ENCOUNTER — Encounter (HOSPITAL_COMMUNITY): Payer: Self-pay | Admitting: General Practice

## 2019-03-13 LAB — BASIC METABOLIC PANEL
Anion gap: 9 (ref 5–15)
BUN: 7 mg/dL — ABNORMAL LOW (ref 8–23)
CO2: 27 mmol/L (ref 22–32)
Calcium: 8.4 mg/dL — ABNORMAL LOW (ref 8.9–10.3)
Chloride: 102 mmol/L (ref 98–111)
Creatinine, Ser: 0.85 mg/dL (ref 0.61–1.24)
GFR calc Af Amer: >60 mL/min (ref >60)
GFR calc non Af Amer: >60 mL/min (ref >60)
Glucose, Bld: 113 mg/dL — ABNORMAL HIGH (ref 70–99)
Potassium: 3.8 mmol/L (ref 3.5–5.1)
Sodium: 138 mmol/L (ref 135–145)

## 2019-03-13 LAB — GLUCOSE, CAPILLARY
Glucose-Capillary: 122 mg/dL — ABNORMAL HIGH (ref 70–99)
Glucose-Capillary: 142 mg/dL — ABNORMAL HIGH (ref 70–99)
Glucose-Capillary: 145 mg/dL — ABNORMAL HIGH (ref 70–99)
Glucose-Capillary: 137 mg/dL — ABNORMAL HIGH (ref 70–99)
Glucose-Capillary: 123 mg/dL — ABNORMAL HIGH (ref 70–99)

## 2019-03-13 LAB — CBC
HCT: 32.5 % — ABNORMAL LOW (ref 39.0–52.0)
Hemoglobin: 10.3 g/dL — ABNORMAL LOW (ref 13.0–17.0)
MCH: 30.5 pg (ref 26.0–34.0)
MCHC: 31.7 g/dL (ref 30.0–36.0)
MCV: 96.2 fL (ref 80.0–100.0)
Platelets: 405 10*3/uL — ABNORMAL HIGH (ref 150–400)
RBC: 3.38 MIL/uL — ABNORMAL LOW (ref 4.22–5.81)
RDW: 14.3 % (ref 11.5–15.5)
WBC: 10.5 10*3/uL (ref 4.0–10.5)
nRBC: 0 % (ref 0.0–0.2)

## 2019-03-13 MED ORDER — OXYCODONE HCL ER 15 MG PO T12A
30.0000 mg | EXTENDED_RELEASE_TABLET | Freq: Two times a day (BID) | ORAL | Status: DC
  Administered 2019-03-13 – 2019-03-14 (×2): 30 mg via ORAL
  Filled 2019-03-13 (×2): qty 2

## 2019-03-13 MED ORDER — PANTOPRAZOLE SODIUM 40 MG PO TBEC
40.0000 mg | DELAYED_RELEASE_TABLET | Freq: Every day | ORAL | Status: DC
  Administered 2019-03-13: 40 mg via ORAL
  Filled 2019-03-13: qty 1

## 2019-03-13 MED ORDER — LORAZEPAM 0.5 MG PO TABS: 0.5000 mg | ORAL_TABLET | Freq: Four times a day (QID) | ORAL | Status: DC | PRN

## 2019-03-13 NOTE — Plan of Care (Signed)
  Problem: Pain Managment: Goal: General experience of comfort will improve Outcome: Progressing   Problem: Safety: Goal: Ability to remain free from injury will improve Outcome: Progressing   

## 2019-03-13 NOTE — Progress Notes (Signed)
     Subjective: 1 Day Post-Op Procedure(s) (LRB): REVISION OF HARDWARE THORACIC TEN-THORACIC ELEVEN WITH APPLICATION OF ADDITIONAL RODS AND ROD SLEEVES THORACIC EIGHT-THORACIC TWELVE 12-LUMBAR ONE (N/A)Awake, alert and oriented x4. Foley out and has voided. Pain is not well controlled per patient. Was on a SR oxycodone pre op and I will restart today.   Patient reports pain as marked.    Objective:   VITALS:  Temp:  [98.4 F (36.9 C)-98.5 F (36.9 C)] 98.5 F (36.9 C) (08/26 0827) Pulse Rate:  [67-92] 67 (08/26 0827) Resp:  [16-18] 16 (08/26 0827) BP: (111-141)/(63-78) 111/68 (08/26 0827) SpO2:  [97 %-98 %] 98 % (08/26 0441)  Neurologically intact ABD soft Neurovascular intact Sensation intact distally Intact pulses distally Dorsiflexion/Plantar flexion intact Incision: scant drainage and Dressing inferiorly is off and staples are exposed to gown at inferior aspect of incision. Painted with betadiene and new dressing applied and reinforced with hypofix. No cellulitis present Compartment soft   LABS Recent Labs    03/12/19 0710 03/13/19 0434  HGB 12.3* 10.3*  WBC 9.4 10.5  PLT 430* 405*   Recent Labs    03/12/19 0710 03/13/19 0434  NA 137 138  K 3.7 3.8  CL 102 102  CO2 24 27  BUN 8 7*  CREATININE 0.91 0.85  GLUCOSE 171* 113*   Recent Labs    03/12/19 0710  INR 1.1     Assessment/Plan: 1 Day Post-Op Procedure(s) (LRB): REVISION OF HARDWARE THORACIC TEN-THORACIC ELEVEN WITH APPLICATION OF ADDITIONAL RODS AND ROD SLEEVES THORACIC EIGHT-THORACIC TWELVE 12-LUMBAR ONE (N/A)  Advance diet Up with therapy D/C IV fluids Plan for discharge tomorrow  [name] 03/13/2019, 3:48 PMPatient ID: Luis Brown, male   DOB: 02/08/1946, 73 y.o.   MRN: [id number]

## 2019-03-13 NOTE — Evaluation (Signed)
Physical Therapy Evaluation & Discharge Patient Details Name: Luis Brown MRN: [id number] DOB: September 04, 1945 Today's Date: 03/13/2019   History of Present Illness  Pt is a 73 y.o. male s/p recent T8-10 laminectomy and fusion (01/2019), pt with increased pain found to have loosening hardware, readmitted and now s/p hardware revision T10-11 with additional rod placement T8-12 on 03/12/19. Other PMH includes HTN, DDD, DM, anxiety, depression.    Clinical Impression  Patient evaluated by Physical Therapy with no further acute PT needs identified. PTA, pt has been ambulating at home independently since recent back sx (01/2019), 24/7 assist available from wife; good recall of precautions and brace wear. Today, pt ambulating with RW at supervision-level; wife present. Pt's main concern is pain control. All education has been completed and the patient has no further questions. Encouraged continued mobility with supervision from wife or nursing staff. Acute PT is signing off. Thank you for this referral.    Follow Up Recommendations No PT follow up;Supervision for mobility/OOB    Equipment Recommendations  None recommended by PT    Recommendations for Other Services       Precautions / Restrictions Precautions Precautions: Fall;Back Precaution Comments: Verbally reviewed precautions, pt familiar since recent back sx; pt able to state 2/3, cues for "lifting" Required Braces or Orthoses: Spinal Brace Spinal Brace: Thoracolumbosacral orthotic;Applied in sitting position Restrictions Weight Bearing Restrictions: No      Mobility  Bed Mobility Overal bed mobility: Modified Independent             General bed mobility comments: Cues for log roll technqiue  Transfers Overall transfer level: Needs assistance Equipment used: Rolling walker (2 wheeled) Transfers: Sit to/from Stand Sit to Stand: Supervision         General transfer comment: Supervision for safety, no physical assist  required  Ambulation/Gait Ambulation/Gait assistance: Supervision Gait Distance (Feet): 500 Feet Assistive device: Rolling walker (2 wheeled) Gait Pattern/deviations: Step-through pattern;Decreased stride length;Antalgic Gait velocity: Decreased Gait velocity interpretation: 1.31 - 2.62 ft/sec, indicative of limited community ambulator General Gait Details: Mildly unsteady gait with RW, intermittent cues for safety with RW as pt distracted by pain; supervision-level for safety. Wife present for education  Stairs Stairs: Yes Stairs assistance: Supervision Stair Management: One rail Right;Alternating pattern;Forwards Number of Stairs: 4 General stair comments: Good technique; supervision for safety. Wife present for education  Wheelchair Mobility    Modified Rankin (Stroke Patients Only)       Balance Overall balance assessment: Needs assistance   Sitting balance-Leahy Scale: Good       Standing balance-Leahy Scale: Fair Standing balance comment: Can static stand to void at toilet and to remove brace standing at EOB without UE support                             Pertinent Vitals/Pain Pain Assessment: Faces Faces Pain Scale: Hurts little more Pain Location: L-side lower back Pain Descriptors / Indicators: Constant;Burning Pain Intervention(s): Premedicated before session;Ice applied    Home Living Family/patient expects to be discharged to:: Private residence Living Arrangements: Spouse/significant other Available Help at Discharge: Family;Available 24 hours/day Type of Home: House Home Access: Stairs to enter Entrance Stairs-Rails: Right Entrance Stairs-Number of Steps: 2 Home Layout: One level Home Equipment: Bedside commode;Cane - single point;Walker - 2 wheels Additional Comments: Wife working from home and present for 24/7    Prior Function Level of Independence: Independent  Comments: Pt has been home since initial sx 01/2019, was no  longer requiring RW for ambulation, but limited by pain     Hand Dominance        Extremity/Trunk Assessment   Upper Extremity Assessment Upper Extremity Assessment: Overall WFL for tasks assessed    Lower Extremity Assessment Lower Extremity Assessment: Generalized weakness       Communication   Communication: No difficulties  Cognition Arousal/Alertness: Awake/alert Behavior During Therapy: WFL for tasks assessed/performed Overall Cognitive Status: Within Functional Limits for tasks assessed                                 General Comments: WFL for simple tasks, although distracted by pain with poor attention      General Comments General comments (skin integrity, edema, etc.): Wife present and supportive. No questions or concerns since time at home after previous sx. Pt's main concern is pain control.    Exercises     Assessment/Plan    PT Assessment Patent does not need any further PT services  PT Problem List         PT Treatment Interventions      PT Goals (Current goals can be found in the Care Plan section)  Acute Rehab PT Goals PT Goal Formulation: All assessment and education complete, DC therapy    Frequency     Barriers to discharge        Co-evaluation               AM-PAC PT "6 Clicks" Mobility  Outcome Measure Help needed turning from your back to your side while in a flat bed without using bedrails?: None Help needed moving from lying on your back to sitting on the side of a flat bed without using bedrails?: None Help needed moving to and from a bed to a chair (including a wheelchair)?: None Help needed standing up from a chair using your arms (e.g., wheelchair or bedside chair)?: A Little Help needed to walk in hospital room?: A Little Help needed climbing 3-5 steps with a railing? : A Little 6 Click Score: 21    End of Session Equipment Utilized During Treatment: Back brace Activity Tolerance: Patient tolerated  treatment well Patient left: in bed;with call bell/phone within reach;with family/visitor present Nurse Communication: Mobility status PT Visit Diagnosis: Other abnormalities of gait and mobility (R26.89)    Time: [id number] PT Time Calculation (min) (ACUTE ONLY): 17 min   Charges:   PT Evaluation $PT Eval Moderate Complexity: [place], PT, DPT Acute Rehabilitation Services  Pager [phone] Office [place] 03/13/2019, 10:39 AM

## 2019-03-13 NOTE — Telephone Encounter (Signed)
Gabapentin refill request 

## 2019-03-13 NOTE — Evaluation (Signed)
Occupational Therapy Evaluation Patient Details Name: Luis Brown MRN: [id number] DOB: 1945-11-18 Today's Date: 03/13/2019    History of Present Illness Pt is a 73 y.o. male s/p recent T8-10 laminectomy and fusion (01/2019), pt with increased pain found to have loosening hardware, readmitted and now s/p hardware revision T10-11 with additional rod placement T8-12 on 03/12/19. Other PMH includes HTN, DDD, DM, anxiety, depression.   Clinical Impression   OT eval and education complete.  Pts wife aware of need for 24/7 A.  Pt moves quickly and needs VC to slow down and take his time.      Follow Up Recommendations  No OT follow up;Supervision/Assistance - 24 hour    Equipment Recommendations  None recommended by OT    Recommendations for Other Services       Precautions / Restrictions Precautions Precautions: Fall;Back Required Braces or Orthoses: Spinal Brace Spinal Brace: Thoracolumbosacral orthotic;Applied in sitting position Restrictions Weight Bearing Restrictions: No      Mobility Bed Mobility Overal bed mobility: Modified Independent             General bed mobility comments: Cues for log roll technqiue  Transfers Overall transfer level: Needs assistance Equipment used: Rolling walker (2 wheeled) Transfers: Sit to/from Stand Sit to Stand: Supervision         General transfer comment: Supervision for safety, no physical assist required    Balance Overall balance assessment: Needs assistance   Sitting balance-Leahy Scale: Good       Standing balance-Leahy Scale: Fair Standing balance comment: Can static stand to void at toilet and to remove brace standing at EOB without UE support                           ADL either performed or assessed with clinical judgement   ADL Overall ADL's : Needs assistance/impaired Eating/Feeding: Set up;Sitting   Grooming: Wash/dry face;Wash/dry hands;Standing   Upper Body Bathing: Set up;Sitting    Lower Body Bathing: Minimal assistance;Sit to/from stand;Cueing for safety;Cueing for compensatory techniques;Cueing for sequencing   Upper Body Dressing : Set up;Sitting   Lower Body Dressing: Minimal assistance;Sit to/from stand;Cueing for safety;Cueing for sequencing   Toilet Transfer: Minimal assistance;Ambulation;Cueing for safety   Toileting- Clothing Manipulation and Hygiene: Minimal assistance;Sit to/from stand         General ADL Comments: pt moves quickly and needs VC to slow down. Wife aware of his tendencies and says she she wil be with him at all times.  Educated pt and wife on importance of slowing down and taking time.  Pt and wife verbalized understanding     Vision Patient Visual Report: No change from baseline              Pertinent Vitals/Pain Pain Score: 4  Pain Location: L-side lower back Pain Descriptors / Indicators: Sore;Discomfort Pain Intervention(s): Limited activity within patient's tolerance     Hand Dominance     Extremity/Trunk Assessment Upper Extremity Assessment Upper Extremity Assessment: Overall WFL for tasks assessed           Communication Communication Communication: No difficulties   Cognition Arousal/Alertness: Awake/alert Behavior During Therapy: WFL for tasks assessed/performed Overall Cognitive Status: Within Functional Limits for tasks assessed                                 General Comments: WFL for simple tasks, although distracted by  pain with poor attention   General Comments               Home Living Family/patient expects to be discharged to:: Private residence Living Arrangements: Spouse/significant other Available Help at Discharge: Family;Available 24 hours/day Type of Home: House Home Access: Stairs to enter [employer] of Steps: 2 Entrance Stairs-Rails: Right Home Layout: One level     Bathroom Shower/Tub: Occupational psychologist: Handicapped  height Bathroom Accessibility: Yes   Home Equipment: Bedside commode;Cane - single point;Walker - 2 wheels   Additional Comments: Wife working from home and present for 24/7      Prior Functioning/Environment Level of Independence: Independent        Comments: Pt has been home since initial sx 01/2019, was no longer requiring RW for ambulation, but limited by pain                 OT Goals(Current goals can be found in the care plan section) Acute Rehab OT Goals Patient Stated Goal: home with wife OT Goal Formulation: With patient  OT Frequency:      AM-PAC OT "6 Clicks" Daily Activity     Outcome Measure Help from another person eating meals?: None Help from another person taking care of personal grooming?: A Little Help from another person toileting, which includes using toliet, bedpan, or urinal?: A Little Help from another person bathing (including washing, rinsing, drying)?: A Little Help from another person to put on and taking off regular upper body clothing?: A Little Help from another person to put on and taking off regular lower body clothing?: A Little 6 Click Score: 19   End of Session Equipment Utilized During Treatment: Rolling walker;Gait belt Nurse Communication: Mobility status  Activity Tolerance: Patient tolerated treatment well Patient left: in bed;with call bell/phone within reach;with family/visitor present  OT Visit Diagnosis: Unsteadiness on feet (R26.81)                Time: [id number] OT Time Calculation (min): 22 min Charges:  OT General Charges $OT Visit: 1 Visit OT Evaluation $OT Eval Low Complexity: 1 Low  Kari Baars, OT Acute Rehabilitation Services [phone] Office- [phone]     Dawnyel Leven, Edwena Felty D 03/13/2019, 3:27 PM

## 2019-03-13 NOTE — Plan of Care (Addendum)
  Problem: Activity: Goal: Risk for activity intolerance will decrease 03/13/2019 0326 by Josepha Pigg, RN Outcome: Progressing 03/13/2019 0304 by Josepha Pigg, RN Outcome: Progressing   Problem: Pain Managment: Goal: General experience of comfort will improve 03/13/2019 0326 by Josepha Pigg, RN Outcome: Progressing 03/13/2019 0304 by Josepha Pigg, RN Outcome: Progressing Foley removed, pt due to void by 10:00am today 03/13/2019.  Pt verbalized understanding to notify staff, urinal placed at the bedside.  Call light and phone in reach. Wife at supportive, wants to speak with the rounding surgical team this morning.

## 2019-03-14 LAB — GLUCOSE, CAPILLARY: Glucose-Capillary: 120 mg/dL — ABNORMAL HIGH (ref 70–99)

## 2019-03-14 MED ORDER — OXYCODONE ER 36 MG PO C12A: 1.0000 | EXTENDED_RELEASE_CAPSULE | Freq: Two times a day (BID) | ORAL | 0 refills | Status: DC

## 2019-03-14 MED ORDER — OXYCODONE-ACETAMINOPHEN 5-325 MG PO TABS: 1.0000 | ORAL_TABLET | ORAL | 0 refills | Status: DC | PRN

## 2019-03-14 NOTE — Care Management (Signed)
Per Handoff from [employer]: "Hardware revision, from home w wife, has needed DME at home no HH recommended"

## 2019-03-14 NOTE — Plan of Care (Signed)
  Problem: Pain Managment: Goal: General experience of comfort will improve Outcome: Progressing   Problem: Safety: Goal: Ability to remain free from injury will improve Outcome: Progressing   

## 2019-03-14 NOTE — Plan of Care (Signed)
  Problem: Health Behavior/Discharge Planning: °Goal: Ability to manage health-related needs will improve °Outcome: Adequate for Discharge °  °Problem: Activity: °Goal: Risk for activity intolerance will decrease °Outcome: Adequate for Discharge °  °Problem: Pain Managment: °Goal: General experience of comfort will improve °Outcome: Adequate for Discharge °  °

## 2019-03-14 NOTE — Progress Notes (Signed)
Discharge instructions given to pt and gone over with him and wife present, both verbalized understanding. All belongings gathered to be sent home.

## 2019-03-14 NOTE — Progress Notes (Signed)
     Subjective: 2 Days Post-Op Procedure(s) (LRB): REVISION OF HARDWARE THORACIC TEN-THORACIC ELEVEN WITH APPLICATION OF ADDITIONAL RODS AND ROD SLEEVES THORACIC EIGHT-THORACIC TWELVE 12-LUMBAR ONE (N/A) Awake, alert and oriented x 4. Voiding without difficulty. Tolerating po narcotics though he requests more medication. He is on a reduced amount of oxycodone ER at 30 mg home med is 36 mg. Miralax for bowel and stool softner. Patient reports pain as moderate.    Objective:   VITALS:  Temp:  [97.9 F (36.6 C)-98.2 F (36.8 C)] 98.2 F (36.8 C) (08/27 0748) Pulse Rate:  [67-96] 96 (08/27 0748) Resp:  [16-20] 16 (08/27 0748) BP: (122-170)/(58-74) 128/66 (08/27 0748) SpO2:  [97 %-99 %] 99 % (08/27 0748)  Neurologically intact ABD soft Neurovascular intact Sensation intact distally Intact pulses distally Dorsiflexion/Plantar flexion intact Incision: no drainage No cellulitis present Compartment soft   LABS Recent Labs    03/12/19 0710 03/13/19 0434  HGB 12.3* 10.3*  WBC 9.4 10.5  PLT 430* 405*   Recent Labs    03/12/19 0710 03/13/19 0434  NA 137 138  K 3.7 3.8  CL 102 102  CO2 24 27  BUN 8 7*  CREATININE 0.91 0.85  GLUCOSE 171* 113*   Recent Labs    03/12/19 0710  INR 1.1     Assessment/Plan: 2 Days Post-Op Procedure(s) (LRB): REVISION OF HARDWARE THORACIC TEN-THORACIC ELEVEN WITH APPLICATION OF ADDITIONAL RODS AND ROD SLEEVES THORACIC EIGHT-THORACIC TWELVE 12-LUMBAR ONE (N/A)  Advance diet Up with therapy Discharge home with home health  [name] 03/14/2019, 8:48 AMPatient ID: Luis Brown, male   DOB: Dec 26, 1945, 73 y.o.   MRN: [id number]

## 2019-03-15 ENCOUNTER — Telehealth: Payer: Self-pay | Admitting: *Deleted

## 2019-03-15 NOTE — Telephone Encounter (Signed)
Patient discharged from hospital 03/14/19 after revision of thoracic hardware surgery. Patient instructions state for him to f/u with Dr. Louanne Skye.

## 2019-03-16 ENCOUNTER — Other Ambulatory Visit: Payer: Self-pay | Admitting: Family Medicine

## 2019-03-17 ENCOUNTER — Encounter (HOSPITAL_COMMUNITY): Payer: Self-pay | Admitting: Specialist

## 2019-03-18 ENCOUNTER — Encounter: Payer: Self-pay | Admitting: Specialist

## 2019-03-19 ENCOUNTER — Other Ambulatory Visit: Payer: Self-pay | Admitting: Specialist

## 2019-03-19 NOTE — Discharge Summary (Signed)
Patient ID: ABRON HAQ MRN: [id number] DOB/AGE: 75/16/1947 75 y.o.  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 Acute pharyngitis 10/21/2013  . Allergy    grass and pollen  . Anxiety and depression 10/25/2011  . BPH (benign prostatic hyperplasia) 04/23/2012  . Chicken pox as a child  . DDD (degenerative disc disease)    cervical responds to steroid injections and low back required surgery  . DDD (degenerative disc disease), lumbosacral   . Diabetes mellitus    pre  . Dyspnea   . ED (erectile dysfunction) 04/23/2012  . Elevated BP   . Esophageal reflux 02/10/2015  . Fatigue   . HTN (hypertension)   . Hyperglycemia    preDM   . Hyperlipidemia   . Insomnia   . Low back pain radiating to both legs 01/16/2017  . Measles as a child  . Medicare annual wellness visit, subsequent 02/10/2015  . Overweight(278.02)   . Personal history of colonic polyps 10/27/2012   Follows with [facility]  . Pneumonia    " walking"  . Preventative health care   . 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  . Wears glasses     Surgeries: Procedure(s): REVISION OF HARDWARE THORACIC TEN-THORACIC ELEVEN WITH APPLICATION OF ADDITIONAL RODS AND ROD SLEEVES THORACIC EIGHT-THORACIC TWELVE 12-LUMBAR ONE on 03/12/2019   Consultants:   Discharged Condition: Improved  Hospital Course: TAESHAUN ALLMAN is an 73 y.o. male who was admitted 03/12/2019 for operative treatment of spine hardware loosening. Patient failed conservative treatments  (please see the history and physical for the specifics) and had severe unremitting pain that affects sleep, daily activities and work/hobbies. After pre-op clearance, the patient was taken to the operating room on 03/12/2019 and underwent  Procedure(s): REVISION OF HARDWARE THORACIC TEN-THORACIC ELEVEN WITH APPLICATION OF ADDITIONAL RODS AND ROD SLEEVES THORACIC EIGHT-THORACIC TWELVE 12-LUMBAR ONE.    Patient was given perioperative antibiotics:  Anti-infectives (From admission, onward)   Start     Dose/Rate Route Frequency Ordered Stop   03/12/19 1500  ceFAZolin (ANCEF) IVPB 2g/100 mL premix     2 g 200 mL/hr over 30 Minutes Intravenous Every 8 hours 03/12/19 1445 03/12/19 2123   03/12/19 0645  ceFAZolin (ANCEF) IVPB 2g/100 mL premix     2 g 200 mL/hr over 30 Minutes Intravenous On call to O.R. 03/12/19 [id number] 03/12/19 [id number]       Patient was given sequential compression devices and early ambulation to prevent DVT.   Patient benefited maximally from hospital stay and there were no complications. At the time of discharge, the patient was urinating/moving their bowels without difficulty, tolerating a regular diet, pain is controlled with oral pain medications and they have been cleared by PT/OT.   Recent vital signs: No data found.   Recent laboratory studies: No results for input(s): WBC, HGB, HCT, PLT, NA, K, CL, CO2, BUN, CREATININE, GLUCOSE, INR, CALCIUM in the last 72 hours.  Invalid input(s): PT, 2   Discharge Medications:   Allergies as of 03/14/2019   No Known Allergies     Medication  List    TAKE these medications   aspirin 81 MG tablet Take 81 mg by mouth daily.   atorvastatin 20 MG tablet Commonly known as: LIPITOR TAKE 1/2 TABLET BY MOUTH DAILY AT 6 PM What changed: See the new instructions.   baclofen 10 MG tablet Commonly known as: LIORESAL Take 1 tablet (10 mg total) by mouth 3 (three) times daily.   cetirizine 10 MG tablet Commonly known as: ZYRTEC Take 10 mg  by mouth daily. In the morning   Contour Next Test test strip Generic drug: glucose blood Check blood sugar once daily   diazepam 10 MG tablet Commonly known as: VALIUM TAKE 1 TABLET(10 MG) BY MOUTH AT BEDTIME AS NEEDED What changed: See the new instructions.   gabapentin 300 MG capsule Commonly known as: NEURONTIN TAKE 1 CAPSULE BY MOUTH THREE TIMES A DAY What changed: See the new instructions.   lisinopril 5 MG tablet Commonly known as: ZESTRIL Take 1 tablet (5 mg total) by mouth 2 (two) times daily.   Melatonin 10 MG Tabs Take 10 mg by mouth at bedtime as needed (sleep).   metFORMIN 500 MG tablet Commonly known as: GLUCOPHAGE TAKE ONE TABLET BY MOUTH ONE TIME DAILY What changed: when to take this   oxyCODONE ER 36 MG C12a Take 1 capsule (36 mg total) by mouth every 12 (twelve) hours for 7 days.   oxyCODONE-acetaminophen 5-325 MG tablet Commonly known as: PERCOCET/ROXICET Take 1-2 tablets by mouth every 4 (four) hours as needed for up to 7 days for severe pain.   sildenafil 20 MG tablet Commonly known as: REVATIO TAKE ONE TO THREE TABLETS BY MOUTH DAILY AS NEEDED   tamsulosin 0.4 MG Caps capsule Commonly known as: FLOMAX TAKE TWO CAPSULES BY MOUTH DAILY What changed: when to take this   testosterone cypionate 200 MG/ML injection Commonly known as: DEPOTESTOSTERONE CYPIONATE Inject 1 mL (200 mg total) into the muscle every 14 (fourteen) days.   tiZANidine 4 MG tablet Commonly known as: ZANAFLEX Take 1 tablet (4 mg total) by mouth every 6 (six) hours as needed for muscle spasms.   venlafaxine XR 150 MG 24 hr capsule Commonly known as: EFFEXOR-XR take 1 capsule by mouth one time daily with breakfast What changed: See the new instructions.       Diagnostic Studies: Dg Thoracic Spine 2 View  Result Date: 03/12/2019 CLINICAL DATA:  Thoracolumbar fusion revision. EXAM: DG C-ARM 1-60 MIN; THORACIC SPINE 2 VIEWS FLUOROSCOPY TIME:  14 seconds. C-arm fluoroscopic  images were obtained intraoperatively and submitted for post operative interpretation. COMPARISON:  Thoracic spine x-rays dated February 27, 2019. FINDINGS: Multiple intraoperative fluoroscopic images demonstrate interval thoracolumbar fusion revision with additional reinforcing rods. Chronic vertebral body height loss and endplate changes at 579FGE, similar to prior studies. IMPRESSION: 1. Intraoperative fluoroscopic guidance for thoracolumbar fusion revision. Electronically Signed   By: Titus Dubin M.D.   On: 03/12/2019 14:56   Dg C-arm 1-60 Min  Result Date: 03/12/2019 CLINICAL DATA:  Thoracolumbar fusion revision. EXAM: DG C-ARM 1-60 MIN; THORACIC SPINE 2 VIEWS FLUOROSCOPY TIME:  14 seconds. C-arm fluoroscopic images were obtained intraoperatively and submitted for post operative interpretation. COMPARISON:  Thoracic spine x-rays dated February 27, 2019. FINDINGS: Multiple intraoperative fluoroscopic images demonstrate interval thoracolumbar fusion revision with additional reinforcing rods. Chronic vertebral body height loss and endplate changes at 579FGE, similar to prior studies. IMPRESSION: 1. Intraoperative fluoroscopic guidance for thoracolumbar fusion revision. Electronically Signed   By: Titus Dubin M.D.   On:  03/12/2019 14:56   Xr Thoracic Spine 2 View  Result Date: 02/27/2019 AP and lateral radiographs shows bilateral loosening of the interrod fasteners with increase in thoracic kyphosis.  Xr Lumbar Spine 2-3 Views  Result Date: 02/20/2019 AP and lateral radiographs of the thoracolumbar spine show the left T11-12 inter rod sleeve is loosened and there is mild Kyphosis at the junction of the thoracic hardware, retained and new hardware. The T10-11 disc is degenerated and has a vaacum sign. Right T9 pedicle screw is lateral to vertebra.    Discharge Instructions    Call MD / Call 911   Complete by: As directed    If you experience chest pain or shortness of breath, CALL 911 and be  transported to the hospital emergency room.  If you develope a fever above 101 F, pus (white drainage) or increased drainage or redness at the wound, or calf pain, call your surgeon's office.   Constipation Prevention   Complete by: As directed    Drink plenty of fluids.  Prune juice may be helpful.  You may use a stool softener, such as Colace (over the counter) 100 mg twice a day.  Use MiraLax (over the counter) for constipation as needed.   Diet - low sodium heart healthy   Complete by: As directed    Discharge instructions   Complete by: As directed    Call if there is increasing drainage, fever greater than 101.5, severe head aches, and worsening nausea or light sensitivity. If shortness of breath, bloody cough or chest tightness or pain go to an emergency room. No lifting greater than 10 lbs. Avoid bending, stooping and twisting. Use brace when sitting and out of bed even to go to bathroom. Walk in house for first 2 weeks then may start to get out slowly increasing distances up to one mile by 4-6 weeks post op. After 5 days may shower and change dressing following bathing with shower.When bathing remove the brace shower and replace brace before getting out of the shower. If drainage, keep dry dressing and do not bathe the incision, use an moisture impervious dressing. Please call and return for scheduled follow up appointment 2 weeks from the time of surgery.   Driving restrictions   Complete by: As directed    No driving for 6 weeks   Increase activity slowly as tolerated   Complete by: As directed    Lifting restrictions   Complete by: As directed    No lifting for 8 weeks      Follow-up Information    Jessy Oto, MD In 2 weeks.   Specialty: Orthopedic Surgery Why: For wound re-check Contact information: [place] [address] [phone]           Discharge Plan:  discharge to home  Disposition:     Signed: Benjiman Core   03/19/2019, 10:29 AM

## 2019-03-20 LAB — AEROBIC/ANAEROBIC CULTURE W GRAM STAIN (SURGICAL/DEEP WOUND)

## 2019-03-20 NOTE — Op Note (Signed)
03/12/2019  2:29 PM  PATIENT:  Luis Brown  73 y.o. male  MRN: [id number]  OPERATIVE REPORT  PRE-OPERATIVE DIAGNOSIS:  loosening of hardware interrod connectors Thoracic ten-Thoracic eleven  POST-OPERATIVE DIAGNOSIS:  loosening of hardware interrod connectors Thoracic ten-Thoracic eleven  PROCEDURE:  Procedure(s): REVISION OF HARDWARE THORACIC TEN-THORACIC ELEVEN WITH APPLICATION OF ADDITIONAL RODS AND ROD SLEEVES THORACIC EIGHT-THORACIC TWELVE 12-LUMBAR ONE    SURGEON:  Jessy Oto, MD     ASSISTANT:Shadoe Bethel Ricard Dillon, PA-C  (Present throughout the entire procedure and necessary for completion of procedure in a timely manner)     ANESTHESIA:  General, supplemented with local marcaine 0.5% 1:1 exparel 1.3% total 30cc. Dr.Jackson.      COMPLICATIONS:  None.   EBL: 600cc  SPECIMENS: Small amount of soft tissue form the the pedicle screw openings at T10 Were sent for gram stain and anaerobic and aerobic cultures and sensitivities.     COMPONENTS:   Implant Name Type Inv. Item Serial No. Manufacturer Lot No. LRB No. Used Action  BONE VIVIGEN FORMABLE 10CC - [id number] Bone Implant BONE VIVIGEN FORMABLE 10CC [id number] [employer]  N/A 1 Implanted  CONNECTOR EXPEDIUM TI 55MM - [id number] Connector CONNECTOR EXPEDIUM TI 55MM  [employer]  N/A 5 Implanted  ROD SPINAL CERV Blevins 3.5X420 - [id number] Rod ROD SPINAL CERV COCH 3.5X420  [employer]  N/A 2 Implanted  ROD SPINAL EXP LVL1 5.5X480 - [id number] Rod ROD SPINAL EXP LVL1 5.5X480  [employer]  N/A 1 Implanted  SCREW SET SINGLE INNER - [id number] Screw SCREW SET SINGLE INNER  [employer]  N/A 10 Implanted  explant 1 crosslink,2 connectors,2 rods 10 caps      N/A 15 Explanted   FINDINGS: The inter rod fasteners had displaced from their attachment to the superior end of the retained rods superior to the T11 pedicle screw fasteners. The Rods were replaced with  longer rods with  3 interrod sleeves attaching to the lower retained rods from T11 to L2. Augmentation of the posterior lateral fusion at the site of laminectomy posteriorly T8 to T11 was carried out.  PROCEDURE:The patient was met in the holding area, and the appropriate Thoracic levels T5 to T11 identified and marked with an x and my initials.The patient was then transported to OR. The patient was then placed under general anesthesia without difficulty.The patient received appropriate preoperative antibiotic prophylaxis ancef. Nursing staff inserted a Foley catheter under sterile conditions. He was then turned to a prone position [place] spine table was used for this case. All pressure points were well padded PAS stocking applied bilateral lower extremity to prevent DVT. Standard prep DuraPrep solution. Draped in the usual manner. Time-out procedure was called and correct .  The old incision scar was incised T4 to L1.  Bovie electric cautery was used to control bleeding and carefully dissection was carried down along the lateral aspects of the spinous process of T4 to L2. Cobb then used to carefully elevate the parathoracolumbar muscle and the incision in the midline was carried to to the level of the base of the residual spinous processes. The laminotomy area extending from the base of the spinous process of T8  to the superior aspect of T12 was carefully exposed and its edges debrided the scar tissue using a large curette.The bilateral thoracic rods and pedicle screw fasteners exposed from T5 to the T11 level and the older rods and pedicle screw fasteners exposed From  T11 to L1 bilaterally.  Cerebellar retractors were placed. Loupe magnification and headlight were used for this portion of the procedure. Each of the pedicle screw fastener caps were removed bilaterally at T5, T6, T7, T8 and T9. The transverse loading rod at the T8-9 level was removed by loosening the dorsal nuts and grasping with a Kocher  clamp and removing. This allowed for removal of the upper 40m bilateral rods from T5 to T10 with the bilateral interrod connectors at the distal part of the rods.The old retained crosslink at the T11-12 level was then exposed with currettes, scapel and pituitary ronguers and the dorsal nuts loosened and the cross link removed. The lower older retained rods then further exposed caudally to the L2 level. Inter rod sleeves then placed bilaterally attaching to the older caudal rods at the superior rod cranial to the T11 pedicle screw fasteners, at the T11-12 portion of the rods bilaterally in the area vacated by removal of the cross link here and at the T12-L2 area of rod bilaterally for a total of 6 interrod sleeve connectors, 3 on each side. The posterior elements at the T8-T11 level were exposed and debrided of scar so as to provide for augmentation of the fusion mass at this level due to the posterior laminectomy and loss of posterior elements and interspinous and supraspinous tether here. The pedicle of T10 was examined and attempts at tapping with 7 and 8 mm taps demonstrated that the pedicles were not able to be threaded Due to previous loosening of screws at this level with widening of the pedicle channels. As radiograph images were concerning for osteolysis at the T9-10 disc some currected specimens of soft tissue was able to be removed via the inter pedicle channels were the previous loosened pedicle screws had resided that were removed with the first procedure 02/12/2019. These were sent for culture and sensitivity both anaerobic and aerobic. Templates used to contour the rods that would extend from T5 to the T12-L2 levels. A single [id number] [id number] was able to be divided to nearly identical lengths for the revision of the upper rod segments. The rods then placed into the interrod sleeves caudally and reduced into the pedicle screw fasteners at T5 to T9. Vivigen and locally harvested bone graft then placed  over the posterior and posterolateral elements after decortication with a 3mm34mund burr. Each of the pedicle screw fastener caps were then placed and the fastener caps tightened to 80 foot lbs.The 6 inter rod sleeve screws two for each fastener were also tightened to 80 foot lbs. A singe cross link was replaced at the T8-9 level using an A3 cross link and tightening the fastening screws to 80 foot lbs. Irrigation was carried out using copious amounts of irrigant solution.Permanent C-arm images were obtained in AP oblique and lateral positions for documentation purposes. They showed the pedicle screws rods to be in good position alignment from T5-L2.  Adequate hemostasis then with electrocautery had been obtained. Copious amount of irrigation was then carried out and devitalized tissue debrided. There was 600 cc blood loss.. The deep paralumbar muscles were approximated with 1 Vicryl sutures, the lumbodorsal fascia approximated with 0 and 1 Vicryl sutures. Subcutaneous layers were then approximated with interrupted 0 and 2-0 Vicryl sutures , skin closed with a running subcuticular 4-0 Vicryl suture. The bone was then applied. Mepilex bandage was then applied. All instrument and sponge counts were correct. Patient was then returned to the supine position reactivated extubated and returned  to the recovery room in satisfactory condition. Benjiman Core, PA-C perform the duties of assistant surgeon during this case. He was present from the beginning of the case to the end of the case assisting in transfer the patient from his stretcher to the OR table and back to the stretcher at the end of the case. Assisted in careful retraction and suction of the laminectomy site. He performed removal of rods and assisted with exposure and  closure of the incision from the fascia to the skin applying the dressing.      [name]  03/20/2019, 2:29 PM

## 2019-03-21 ENCOUNTER — Other Ambulatory Visit: Payer: Self-pay | Admitting: Family Medicine

## 2019-03-21 ENCOUNTER — Other Ambulatory Visit: Payer: Self-pay | Admitting: Radiology

## 2019-03-21 ENCOUNTER — Encounter: Payer: Self-pay | Admitting: Specialist

## 2019-03-21 MED ORDER — BACLOFEN 10 MG PO TABS: 10.0000 mg | ORAL_TABLET | Freq: Three times a day (TID) | ORAL | 0 refills | Status: DC

## 2019-03-21 MED ORDER — OXYCODONE-ACETAMINOPHEN 5-325 MG PO TABS: 1.0000 | ORAL_TABLET | ORAL | 0 refills | Status: DC | PRN

## 2019-03-21 MED ORDER — OXYCODONE ER 36 MG PO C12A: 1.0000 | EXTENDED_RELEASE_CAPSULE | Freq: Two times a day (BID) | ORAL | 0 refills | Status: DC

## 2019-03-22 ENCOUNTER — Ambulatory Visit (INDEPENDENT_AMBULATORY_CARE_PROVIDER_SITE_OTHER): Payer: Medicare Other | Admitting: *Deleted

## 2019-03-22 ENCOUNTER — Other Ambulatory Visit: Payer: Self-pay

## 2019-03-22 DIAGNOSIS — E349 Endocrine disorder, unspecified: Secondary | ICD-10-CM | POA: Diagnosis not present

## 2019-03-22 MED ORDER — TESTOSTERONE CYPIONATE 200 MG/ML IM SOLN
200.0000 mg | Freq: Once | INTRAMUSCULAR | Status: AC
  Administered 2019-03-22: 200 mg via INTRAMUSCULAR

## 2019-03-22 NOTE — Progress Notes (Addendum)
Patient in for testosterone injection.  Injection given and patient tolerated well  Next appointment scheduled for 2 weeks  Agree with administration of testosterone.  Mackie Pai, PA-C

## 2019-03-27 ENCOUNTER — Ambulatory Visit: Payer: Self-pay

## 2019-03-27 ENCOUNTER — Ambulatory Visit (INDEPENDENT_AMBULATORY_CARE_PROVIDER_SITE_OTHER): Payer: Medicare Other | Admitting: Surgery

## 2019-03-27 ENCOUNTER — Encounter: Payer: Self-pay | Admitting: Surgery

## 2019-03-27 VITALS — Ht 70.0 in | Wt 202.0 lb

## 2019-03-27 DIAGNOSIS — Z981 Arthrodesis status: Secondary | ICD-10-CM

## 2019-03-28 ENCOUNTER — Encounter: Payer: Self-pay | Admitting: Specialist

## 2019-03-29 ENCOUNTER — Other Ambulatory Visit: Payer: Self-pay | Admitting: Infectious Diseases

## 2019-03-29 ENCOUNTER — Encounter: Payer: Self-pay | Admitting: Radiology

## 2019-03-29 ENCOUNTER — Other Ambulatory Visit: Payer: Self-pay | Admitting: Specialist

## 2019-03-29 ENCOUNTER — Telehealth: Payer: Self-pay | Admitting: *Deleted

## 2019-03-29 DIAGNOSIS — M4620 Osteomyelitis of vertebra, site unspecified: Secondary | ICD-10-CM

## 2019-03-29 MED ORDER — DOXYCYCLINE MONOHYDRATE 100 MG PO TABS: 100.0000 mg | ORAL_TABLET | Freq: Two times a day (BID) | ORAL | 0 refills | Status: DC

## 2019-03-29 MED ORDER — OXYCODONE-ACETAMINOPHEN 5-325 MG PO TABS: 1.0000 | ORAL_TABLET | Freq: Four times a day (QID) | ORAL | 0 refills | Status: DC | PRN

## 2019-03-29 MED ORDER — OXYCODONE ER 36 MG PO C12A: 1.0000 | EXTENDED_RELEASE_CAPSULE | Freq: Two times a day (BID) | ORAL | 0 refills | Status: DC

## 2019-03-29 NOTE — Telephone Encounter (Signed)
Per Dr Franco Nones request, RN called patient to let him know of the referral to [facility] by Dr Louanne Skye and the upcoming appointment scheduled with Dr Prince Rome for Thursday 9/17 at 11:15.   Left message with appointment information, our contact number and address. Dr Prince Rome is anticipating the need for a PICC placement after this office visit. He placed the order, RN followed up with IR Scheduling to make sure it is in the queue correctly.  Anderson Malta scheduled the patient for Friday 9/18 at 2:00 to allow time for home health/nursing/pharmacy coordination after the [facility] visit. RN will follow up with patient with Mychart message.  Landis Gandy, RN

## 2019-03-29 NOTE — Telephone Encounter (Signed)
Thank you for setting this up.

## 2019-03-29 NOTE — Progress Notes (Signed)
73 year old white male returns for recheck.  He is two-week status post revision surgery.  States that he is doing well.  Needs refill of his pain medication.  Has been compliant with wearing his brace.  Exam Wound looks good.  No drainage or signs of infection.  Today every other staple was removed and Steri-Strips were applied.  Neurologically intact.  Plan Patient will follow-up in 1 week for wound check and likely removal of remaining staples.  Return sooner if needed.  Advised that he must continue to be compliant wearing his brace.  No lifting, pushing, pulling, driving.  Gradually increase walking.

## 2019-04-01 ENCOUNTER — Encounter: Payer: Self-pay | Admitting: Medical

## 2019-04-01 ENCOUNTER — Encounter: Payer: Self-pay | Admitting: Specialist

## 2019-04-01 NOTE — Telephone Encounter (Signed)
Patient called and is aware of need for IV PICCU line and antibiotic to treat the area of the spine involved with infection.

## 2019-04-02 ENCOUNTER — Other Ambulatory Visit: Payer: Self-pay | Admitting: Radiology

## 2019-04-02 MED ORDER — BACLOFEN 10 MG PO TABS: 10.0000 mg | ORAL_TABLET | Freq: Three times a day (TID) | ORAL | 0 refills | Status: DC

## 2019-04-02 MED ORDER — FAMOTIDINE 40 MG PO TABS: 40.0000 mg | ORAL_TABLET | Freq: Every day | ORAL | 0 refills | Status: DC

## 2019-04-03 ENCOUNTER — Other Ambulatory Visit: Payer: Self-pay

## 2019-04-03 ENCOUNTER — Ambulatory Visit (INDEPENDENT_AMBULATORY_CARE_PROVIDER_SITE_OTHER): Payer: Medicare Other | Admitting: Surgery

## 2019-04-03 ENCOUNTER — Encounter: Payer: Self-pay | Admitting: Surgery

## 2019-04-03 DIAGNOSIS — Z981 Arthrodesis status: Secondary | ICD-10-CM

## 2019-04-03 NOTE — Progress Notes (Signed)
73 year old white male returns for wound check.  States that he is doing well.  Has been gradually increasing his walking distances around his home.  Pain controlled with medication.  Exam Wound looks good.  Remaining staples removed and Steri-Strips applied over the incision.  He is ambulating well.  Plan Continue brace.  Would not try to overdo it with his activity.  Follow-up in the office in 3 weeks for recheck with Dr. Donavan Burnet and he will likely get x-rays at that time.  We will refill patient's medication as needed.

## 2019-04-04 ENCOUNTER — Encounter: Payer: Self-pay | Admitting: Specialist

## 2019-04-04 ENCOUNTER — Other Ambulatory Visit: Payer: Self-pay

## 2019-04-04 ENCOUNTER — Encounter: Payer: Self-pay | Admitting: Infectious Diseases

## 2019-04-04 ENCOUNTER — Ambulatory Visit: Payer: Medicare Other | Admitting: Infectious Diseases

## 2019-04-04 ENCOUNTER — Telehealth: Payer: Self-pay

## 2019-04-04 VITALS — BP 89/59 | HR 91 | Temp 98.0°F

## 2019-04-04 DIAGNOSIS — T8450XA Infection and inflammatory reaction due to unspecified internal joint prosthesis, initial encounter: Secondary | ICD-10-CM

## 2019-04-04 DIAGNOSIS — M4620 Osteomyelitis of vertebra, site unspecified: Secondary | ICD-10-CM | POA: Diagnosis not present

## 2019-04-04 DIAGNOSIS — M4325 Fusion of spine, thoracolumbar region: Secondary | ICD-10-CM | POA: Diagnosis not present

## 2019-04-04 DIAGNOSIS — M4624 Osteomyelitis of vertebra, thoracic region: Secondary | ICD-10-CM | POA: Insufficient documentation

## 2019-04-04 MED ORDER — CEFTRIAXONE SODIUM 1 G IJ SOLR: 2.0000 g | INTRAMUSCULAR | 0 refills | Status: AC

## 2019-04-04 MED ORDER — DEXTROSE 5 % IV SOLN: 2.0000 g | INTRAVENOUS | Status: DC

## 2019-04-04 NOTE — Progress Notes (Signed)
Subjective:    [name], male    DOB: 1946-04-30, 73 y.o.   MRN: [id number]  HPI the patient is a 73 year old white male with a history of degenerative disc disease and multiple spinal fusions who presents today for concern for a thoracic discitis/prosthetic joint infection.  Please note that the patient's wife was present during today's exam and assisted with much of the surgical and medical history obtained.  Patient has a rather complex surgical history which in regards to his back began in 2015 Dr. Agapito Games performed an T8-L1 fusion to assist with left leg radicular pain and numbness.  Despite multiple visits to see his surgeon at [facility] postoperatively, the patient states that his left leg numbness and direct low thoracic back pain returned within months after his surgery.  Imaging in 2018 showed that he had an L5-S1 rod breakage and loosening of the pedicle screws at that level.  He sought a second opinion with Dr. Louanne Skye late in 2018.  In November 2018 he had a replacement of the L5-S1 fusion segment and bone grafting was performed simultaneously.  Per the operative note reviewed, the broken rod was left intact at that time.  He reports several partial or near falls following this procedure, but he denies any total falls or direct trauma to his back in the past several years.  As his back pain and left leg radicular symptoms gradually returned, further imaging was performed that suggested screw loosening at the T9-T11 section of his spinal fusion.  On February 12, 2019 he returned to the OR with Dr. Donavan Burnet was found to have screw loosening at the same levels so his fusion was extended superiorly to T5 and bone grafting was performed.  Initially, the patient had a good postoperative course but over weeks 2 and 3 postoperatively he began developing pain in his back at that his operative site prompting plain films to be obtained.  X-rays showed a loss of fixation of the opposite left  internal rod fastener at the site of his severe back pain and the development of acute loosening of the enteral rod fasteners and loss of fixation.  He continued to take increasing doses of oral narcotics with no relief in his back pain.  On March 12, 2019, he was taken back to the operating room where it was noted that his Harrington rod had loosened and nearly entirely migrated out of position.  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  Given concern for his spinal stability, his recently placed Harrington rods were removed and revised/replaced.  Operative cultures were followed and remained negative at the time of his hospital discharge on March 14, 2019.  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  I was contacted last week by Dr. Louanne Skye regarding the findings and concern for vertebral osteomyelitis/prosthetic joint infection.  Following this discussion, the patient was started on oral doxycycline 100 mg p.o. twice daily.  Patient states he has not had drainage first following his most recent surgeries no his other preceding surgeries but has had slight improvement in his back pain since beginning antibiotics.  He presents today for an initial evaluation for possible prosthetic joint infection to his spine.  Of note, the patient admits to a fall as a child from a 20 foot height out of a tree but denies any known  vertebral fracture or bony injury following this event.  He also states that while as a child there was also concern that he may have contracted polio but there was never any supporting evidence of this.  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   Past Medical History:  Diagnosis Date   Acute pharyngitis  10/21/2013   Allergy    grass and pollen   Anxiety and depression 10/25/2011   BPH (benign prostatic hyperplasia) 04/23/2012   Chicken pox as a child   DDD (degenerative disc disease)    cervical responds to steroid injections and low back required surgery   DDD (degenerative disc disease), lumbosacral    Diabetes mellitus    pre   Dyspnea    ED (erectile dysfunction) 04/23/2012   Elevated BP    Esophageal reflux 02/10/2015   Fatigue    HTN (hypertension)    Hyperglycemia    preDM    Hyperlipidemia    Insomnia    Low back pain radiating to both legs 01/16/2017   Measles as a child   Medicare annual wellness visit, subsequent 02/10/2015   Overweight(278.02)    Personal history of colonic polyps 10/27/2012   Follows with [facility]   Pneumonia    " walking"   Preventative health care    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   Wears glasses     Past Surgical History:  Procedure Laterality Date   BACK SURGERY  2012 and 1994   Dr Margret Chance, screws and cage in low back   EYE SURGERY Bilateral    2016   HARDWARE REVISION  03/12/2019   REVISION OF HARDWARE THORACIC TEN-THORACIC ELEVEN WITH APPLICATION OF ADDITIONAL RODS AND ROD SLEEVES THORACIC EIGHT-THORACIC TWELVE 12-LUMBAR ONE (N/A )   LAMINECTOMY  02/12/2019   DECOMPRESSIVE LAMINECTOMY THORACIC NINE-THORACIC TEN AND THORACIC TEN-THORACIC ELEVEN, EXTENSION OF THORACOLUMBAR FUSION FROM THORACIC TEN TO THORACIC FIVE, LOCAL BONE GRAFT, ALLOGRAFT AND VIVIGEN (N/A)   SPINAL FUSION N/A 02/12/2019   Procedure: DECOMPRESSIVE LAMINECTOMY THORACIC NINE-THORACIC TEN AND THORACIC TEN-THORACIC ELEVEN, EXTENSION OF THORACOLUMBAR FUSION FROM THORACIC TEN TO THORACIC FIVE, LOCAL BONE GRAFT, ALLOGRAFT AND VIVIGEN;  Surgeon: Jessy Oto, MD;  Location: [place];  Service: Orthopedics;  Laterality: N/A;  DECOMPRESSIVE LAMINECTOMY THORACIC NINE-THORACIC TEN AND THORACIC TEN-THORACIC ELEVEN, EXTENSION OF TH   SPINAL FUSION  N/A 03/12/2019   Procedure: REVISION OF HARDWARE THORACIC TEN-THORACIC ELEVEN WITH APPLICATION OF ADDITIONAL RODS AND ROD SLEEVES THORACIC EIGHT-THORACIC TWELVE 12-LUMBAR ONE;  Surgeon: Jessy Oto, MD;  Location: [place];  Service: Orthopedics;  Laterality: N/A;   TONSILLECTOMY     TONSILLECTOMY     as child   torn rotator cuff  2010   right     Family History  Problem Relation Age of Onset   Hypertension Mother    Diabetes Mother        type 2   Cancer Mother 8       breast in remission   Emphysema Father        smoker   COPD Father        smoker   Stroke Father 14       mini   Heart disease Father    Hypertension Sister    Hyperlipidemia Sister    Scoliosis Sister    Osteoporosis Sister    Hypertension Maternal Grandmother  Scoliosis Maternal Grandmother    Heart disease Maternal Grandfather    Heart disease Paternal Grandfather        smoker   Heart disease Daughter      Social History   Tobacco Use   Smoking status: Former Smoker    Packs/day: 1.00    Years: 20.00    Pack years: 20.00    Types: Cigarettes    Quit date: 07/18/1989    Years since quitting: 29.7   Smokeless tobacco: Never Used   Tobacco comment: off and on smoking for the 20 yrs  Substance Use Topics   Alcohol use: Yes    Comment: drink or two a day- mixed drink   Drug use: No      reports being sexually active.   Outpatient Medications Prior to Visit  Medication Sig Dispense Refill   aspirin 81 MG tablet Take 81 mg by mouth daily.     atorvastatin (LIPITOR) 20 MG tablet TAKE 1/2 TABLET BY MOUTH DAILY AT 6 PM 45 tablet 0   baclofen (LIORESAL) 10 MG tablet Take 1 tablet (10 mg total) by mouth 3 (three) times daily. 30 each 0   cetirizine (ZYRTEC) 10 MG tablet Take 10 mg by mouth daily. In the morning     diazepam (VALIUM) 10 MG tablet TAKE 1 TABLET(10 MG) BY MOUTH AT BEDTIME AS NEEDED (Patient taking differently: Take 5 mg by mouth at bedtime as needed for  sleep. ) 30 tablet 1   doxycycline (ADOXA) 100 MG tablet Take 1 tablet (100 mg total) by mouth 2 (two) times daily. 28 tablet 0   famotidine (PEPCID) 40 MG tablet Take 1 tablet (40 mg total) by mouth at bedtime. 90 tablet 0   gabapentin (NEURONTIN) 300 MG capsule Take 1 capsule (300 mg total) by mouth 3 (three) times daily. 270 capsule 3   glucose blood (CONTOUR NEXT TEST) test strip Check blood sugar once daily 100 each 12   lisinopril (ZESTRIL) 5 MG tablet Take 1 tablet (5 mg total) by mouth 2 (two) times daily. 180 tablet 1   Melatonin 10 MG TABS Take 10 mg by mouth at bedtime as needed (sleep).      metFORMIN (GLUCOPHAGE) 500 MG tablet TAKE ONE TABLET BY MOUTH ONE TIME DAILY  (Patient taking differently: Take 500 mg by mouth 2 (two) times daily with a meal. ) 90 tablet 0   oxyCODONE ER 36 MG C12A Take 1 capsule (36 mg total) by mouth every 12 (twelve) hours for 7 days. 14 capsule 0   oxyCODONE-acetaminophen (PERCOCET/ROXICET) 5-325 MG tablet Take 1 tablet by mouth every 6 (six) hours as needed for up to 7 days for severe pain. 40 tablet 0   sildenafil (REVATIO) 20 MG tablet TAKE ONE TO THREE TABLETS BY MOUTH DAILY AS NEEDED 50 tablet 1   tamsulosin (FLOMAX) 0.4 MG CAPS capsule TAKE TWO CAPSULES BY MOUTH DAILY  (Patient taking differently: Take 0.8 mg by mouth at bedtime. ) 180 capsule 0   testosterone cypionate (DEPOTESTOSTERONE CYPIONATE) 200 MG/ML injection Inject 1 mL (200 mg total) into the muscle every 14 (fourteen) days. 1 mL 5   tiZANidine (ZANAFLEX) 4 MG tablet Take 1 tablet (4 mg total) by mouth every 6 (six) hours as needed for muscle spasms. 30 tablet 0   venlafaxine XR (EFFEXOR-XR) 150 MG 24 hr capsule take 1 capsule by mouth one time daily with breakfast (Patient taking differently: Take 150 mg by mouth daily with breakfast. ) 90 capsule  0   Facility-Administered Medications Prior to Visit  Medication Dose Route Frequency Provider Last Rate Last Dose   testosterone  cypionate (DEPOTESTOSTERONE CYPIONATE) injection 200 mg  200 mg Intramuscular Q14 Days Saguier, Percell Miller, PA-C   200 mg at 02/05/19 1422     No Known Allergies    Review of Systems  Constitutional: Negative for chills, fatigue and fever.  HENT: Negative for congestion, hearing loss, rhinorrhea and sinus pressure.   Eyes: Negative for photophobia, pain, redness and visual disturbance.  Respiratory: Negative for apnea, cough, shortness of breath and wheezing.   Cardiovascular: Negative for chest pain and palpitations.  Gastrointestinal: Negative for abdominal pain, constipation, diarrhea, nausea and vomiting.  Endocrine: Negative for cold intolerance, heat intolerance, polydipsia and polyuria.  Genitourinary: Negative for decreased urine volume, dysuria, frequency, hematuria and testicular pain.  Musculoskeletal: Positive for back pain. Negative for myalgias and neck pain.  Skin: Negative for pallor and rash.  Allergic/Immunologic: Negative for immunocompromised state.  Neurological: Positive for numbness. Negative for dizziness, seizures, syncope, speech difficulty and light-headedness.       Numbness to distal LT leg x months-years  Hematological: Does not bruise/bleed easily.  Psychiatric/Behavioral: Negative for agitation and hallucinations. The patient is not nervous/anxious.        Objective:     Vitals:   04/04/19 1123  BP: (!) 89/59  Pulse: 91  Temp: 98 F (36.7 C)     Physical Exam Gen: pleasant, mild distress secondary to thoracic back pain/discomfort while in extrenal brace, A&Ox 3 Head: NCAT, no temporal wasting evident EENT: PERRL, EOMI, MMM, adequate dentition Neck: supple, no JVD CV: NRRR, no murmurs evident Pulm: CTA bilaterally, no wheeze or retractions Abd: soft, NTND, +BS Extrems:  trace LE edema, 2+ pulses MSK: thoracolumbar incision clean and dry with steri-strips overlying wound w/o erythema or drainage, TTP at midline wound at ~T10, bulge at inferior  pole of incision site (non-tender here though) Skin: no rashes, adequate skin turgor Neuro: CN II-XII grossly intact, patient has external/corset TLSO brace intact, no focal neurologic deficits appreciated but sensory exam not performed, gait was not wide stanced, A&Ox 3   Labs: Lab Results  Component Value Date   WBC 10.5 03/13/2019   HGB 10.3 (L) 03/13/2019   HCT 32.5 (L) 03/13/2019   MCV 96.2 03/13/2019   PLT 405 (H) 03/13/2019   Lab Results  Component Value Date   NA 138 03/13/2019   K 3.8 03/13/2019   CL 102 03/13/2019   CO2 27 03/13/2019   GLUCOSE 113 (H) 03/13/2019   BUN 7 (L) 03/13/2019   CREATININE 0.85 03/13/2019   CALCIUM 8.4 (L) 03/13/2019   MG 1.9 01/23/2019   PHOS 2.3 12/12/2013   No results found for: CRP     Assessment & Plan:  Patient is a 73 year old white male diabetic with complex spinal surgical history requiring multiple revisions and extension of a thoracolumbar fusion and now 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  1.  Thoracic vertebral osteomyelitis/prosthetic joint infection -review of the patient's labs for the past several years shows that he has maintained a normal white blood cell count but has not had any associated CRP.  Like Dr. Louanne Skye, I have significant concern that the patient's hardware loosening on multiple times over the last several years may have actually been due to indolent spinal infection all along.  This may explain the early migration of his Harrington rods despite no presence of overlying surgical site erythema or drainage prior  to his most recent I&D and repetitive hardware fixation.  The pathogen isolated of Propionibacterium acnes is the third leading cause of periprosthetic joint infections and often will present with a subacute course.  It would be extremely unlikely that this pathogen would cause immediate loss of Harrington rods without pre-existing infection.  I spent a significant time  discussing the need for a PICC line with the patient and his wife and explained the process of administration of medications and lab monitoring from home while on IV antibiotics.  Additionally, we have scheduled the patient for placement of a PICC line for tomorrow to begin an anticipated 8-week duration of IV induction therapy with Rocephin 2 g IV daily.  Tentative end date for his IV induction antibiotics would be May 31, 2019.  As it is anticipated that the patient will require retention of his extensive spinal hardware, I would plan for a minimum of 6 months of oral consolidative therapy following IV induction.  I shared with the patient and his wife that this strategy does still have a risk of failure given the concern for extensive infection along his fusion apparatus and the development of biofilm may preclude eradication of his infection.  He expressed understanding and wished to proceed forward with PICC line placement and IV induction treatment.  I spent over 80 minutes of direct face-to-face contact with the patient and his wife today gathering history, creating a treatment plan, and coordinating care.  Check a baseline CRP today.  2.  Diabetes-will check baseline labs with a CBC with differential and BMP today. We will continue to check these labs weekly while he remains on IV antibiotics.  I have instructed the patient to aim for a glycemic control of 150 or less as best possible to optimize his infectious outcome.

## 2019-04-04 NOTE — Telephone Encounter (Signed)
I called Sharyn Lull back, she does have access to what is in the patients chart however what she is needing is not in it, I gave her Dr. Otho Ket cell for her to have Dr. Prince Rome to call and discuss this with him.

## 2019-04-04 NOTE — Patient Instructions (Signed)
PICC to be placed tomorrow and first dose of rocephin then. DO NOT SHOWER WITH PICC LINE INTACT. Return to clinic in 4 weeks for visit with Dr. Prince Rome.

## 2019-04-04 NOTE — Telephone Encounter (Addendum)
Sharyn Lull, nurse at [facility] Disease called stating that patient's most recent office note, reason for referral, and labs need to be faxed to [phone].  Cb# is [phone].  Please advise.  Thank you.  Sharyn Lull called again stating that patient is coming into the office in hour in a half and would like this information faxed.  Please advise.  Thank you.

## 2019-04-05 ENCOUNTER — Other Ambulatory Visit: Payer: Self-pay | Admitting: Radiology

## 2019-04-05 ENCOUNTER — Encounter: Payer: Self-pay | Admitting: Specialist

## 2019-04-05 ENCOUNTER — Other Ambulatory Visit: Payer: Self-pay | Admitting: Infectious Diseases

## 2019-04-05 ENCOUNTER — Other Ambulatory Visit (HOSPITAL_COMMUNITY): Payer: Self-pay

## 2019-04-05 ENCOUNTER — Ambulatory Visit (INDEPENDENT_AMBULATORY_CARE_PROVIDER_SITE_OTHER): Payer: Medicare Other | Admitting: *Deleted

## 2019-04-05 ENCOUNTER — Ambulatory Visit (HOSPITAL_COMMUNITY)
Admission: RE | Admit: 2019-04-05 | Discharge: 2019-04-05 | Disposition: A | Discharge status: Home / Self Care | Payer: Medicare Other | Source: Ambulatory Visit | Attending: Infectious Diseases | Admitting: Infectious Diseases

## 2019-04-05 DIAGNOSIS — M4620 Osteomyelitis of vertebra, site unspecified: Secondary | ICD-10-CM

## 2019-04-05 DIAGNOSIS — Z23 Encounter for immunization: Secondary | ICD-10-CM

## 2019-04-05 DIAGNOSIS — E349 Endocrine disorder, unspecified: Secondary | ICD-10-CM | POA: Diagnosis not present

## 2019-04-05 DIAGNOSIS — Z452 Encounter for adjustment and management of vascular access device: Secondary | ICD-10-CM | POA: Diagnosis not present

## 2019-04-05 LAB — CBC WITH DIFFERENTIAL/PLATELET
Absolute Monocytes: 1064 cells/uL — ABNORMAL HIGH (ref 200–950)
Basophils Absolute: 32 cells/uL (ref 0–200)
Basophils Relative: 0.4 %
Eosinophils Absolute: 208 cells/uL (ref 15–500)
Eosinophils Relative: 2.6 %
HCT: 32.4 % — ABNORMAL LOW (ref 38.5–50.0)
Hemoglobin: 10.3 g/dL — ABNORMAL LOW (ref 13.2–17.1)
Lymphs Abs: 824 cells/uL — ABNORMAL LOW (ref 850–3900)
MCH: 28 pg (ref 27.0–33.0)
MCHC: 31.8 g/dL — ABNORMAL LOW (ref 32.0–36.0)
MCV: 88 fL (ref 80.0–100.0)
MPV: 9.2 fL (ref 7.5–12.5)
Monocytes Relative: 13.3 %
Neutro Abs: 5872 cells/uL (ref 1500–7800)
Neutrophils Relative %: 73.4 %
Platelets: 537 10*3/uL — ABNORMAL HIGH (ref 140–400)
RBC: 3.68 10*6/uL — ABNORMAL LOW (ref 4.20–5.80)
RDW: 14.4 % (ref 11.0–15.0)
Total Lymphocyte: 10.3 %
WBC: 8 10*3/uL (ref 3.8–10.8)

## 2019-04-05 LAB — BASIC METABOLIC PANEL
BUN: 17 mg/dL (ref 7–25)
CO2: 28 mmol/L (ref 20–32)
Calcium: 9.3 mg/dL (ref 8.6–10.3)
Chloride: 100 mmol/L (ref 98–110)
Creat: 0.82 mg/dL (ref 0.70–1.18)
Glucose, Bld: 151 mg/dL — ABNORMAL HIGH (ref 65–99)
Potassium: 5 mmol/L (ref 3.5–5.3)
Sodium: 136 mmol/L (ref 135–146)

## 2019-04-05 LAB — C-REACTIVE PROTEIN: CRP: 57.7 mg/L — ABNORMAL HIGH (ref <8.0)

## 2019-04-05 MED ORDER — HEPARIN SOD (PORK) LOCK FLUSH 100 UNIT/ML IV SOLN
250.0000 [IU] | Freq: Once | INTRAVENOUS | Status: AC
  Administered 2019-04-05: 17:00:00 250 [IU] via INTRAVENOUS

## 2019-04-05 MED ORDER — HEPARIN SOD (PORK) LOCK FLUSH 100 UNIT/ML IV SOLN
INTRAVENOUS | Status: AC
  Administered 2019-04-05: 17:00:00 250 [IU] via INTRAVENOUS
  Filled 2019-04-05: qty 5

## 2019-04-05 MED ORDER — TESTOSTERONE CYPIONATE 200 MG/ML IM SOLN
200.0000 mg | Freq: Once | INTRAMUSCULAR | Status: AC
  Administered 2019-04-05: 10:00:00 200 mg via INTRAMUSCULAR

## 2019-04-05 MED ORDER — HEPARIN SOD (PORK) LOCK FLUSH 100 UNIT/ML IV SOLN
INTRAVENOUS | Status: AC
  Filled 2019-04-05: qty 5

## 2019-04-05 MED ORDER — HEPARIN SOD (PORK) LOCK FLUSH 100 UNIT/ML IV SOLN
INTRAVENOUS | Status: DC | PRN
  Administered 2019-04-05: 500 [IU] via INTRAVENOUS

## 2019-04-05 MED ORDER — OXYCODONE ER 36 MG PO C12A: 1.0000 | EXTENDED_RELEASE_CAPSULE | Freq: Two times a day (BID) | ORAL | 0 refills | Status: DC

## 2019-04-05 MED ORDER — LIDOCAINE HCL 1 % IJ SOLN
INTRAMUSCULAR | Status: AC
  Filled 2019-04-05: qty 20

## 2019-04-05 MED ORDER — SODIUM CHLORIDE 0.9 % IV SOLN
2.0000 g | INTRAVENOUS | Status: AC
  Administered 2019-04-05: 17:00:00 2 g via INTRAVENOUS
  Filled 2019-04-05: qty 2

## 2019-04-05 MED ORDER — LIDOCAINE HCL 1 % IJ SOLN
INTRAMUSCULAR | Status: DC | PRN
  Administered 2019-04-05: 3 mL

## 2019-04-05 MED ORDER — OXYCODONE-ACETAMINOPHEN 5-325 MG PO TABS: 1.0000 | ORAL_TABLET | Freq: Four times a day (QID) | ORAL | 0 refills | Status: DC | PRN

## 2019-04-05 MED ORDER — SODIUM CHLORIDE 0.9 % IV SOLN: 2.0000 g | Freq: Once | INTRAVENOUS | Status: DC

## 2019-04-05 NOTE — Progress Notes (Addendum)
Patient her for flu shot and testosterone injection  Injections given and patient tolerated well.  Appointment scheduled for 2 weeks.  Agree with administration of both injections.  Mackie Pai, PA-C

## 2019-04-05 NOTE — Telephone Encounter (Signed)
Patient aware that Rx's have been sent to his pharmacy.

## 2019-04-05 NOTE — Procedures (Signed)
Interventional Radiology Procedure Note  Procedure: Placement of a right brachial vein approach single lumen PICC. 43cm. Tip is positioned at the superior cavoatrial junction and catheter is ready for immediate use.  Complications: None Recommendations:  - Ok to use - Do not submerge - Routine line care   Signed,  Dulcy Fanny. Earleen Newport, DO

## 2019-04-06 DIAGNOSIS — M4325 Fusion of spine, thoracolumbar region: Secondary | ICD-10-CM | POA: Diagnosis not present

## 2019-04-06 DIAGNOSIS — M4635 Infection of intervertebral disc (pyogenic), thoracolumbar region: Secondary | ICD-10-CM | POA: Diagnosis not present

## 2019-04-07 ENCOUNTER — Other Ambulatory Visit (HOSPITAL_COMMUNITY)
Admission: RE | Admit: 2019-04-07 | Discharge: 2019-04-07 | Disposition: A | Discharge status: Home / Self Care | Payer: Medicare Other | Source: Skilled Nursing Facility | Attending: Infectious Diseases | Admitting: Infectious Diseases

## 2019-04-07 DIAGNOSIS — M4635 Infection of intervertebral disc (pyogenic), thoracolumbar region: Secondary | ICD-10-CM | POA: Diagnosis not present

## 2019-04-07 DIAGNOSIS — T8140XA Infection following a procedure, unspecified, initial encounter: Secondary | ICD-10-CM | POA: Diagnosis not present

## 2019-04-07 DIAGNOSIS — M4325 Fusion of spine, thoracolumbar region: Secondary | ICD-10-CM | POA: Insufficient documentation

## 2019-04-07 DIAGNOSIS — X58XXXA Exposure to other specified factors, initial encounter: Secondary | ICD-10-CM | POA: Insufficient documentation

## 2019-04-07 LAB — CBC WITH DIFFERENTIAL/PLATELET
Abs Immature Granulocytes: 0.03 10*3/uL (ref 0.00–0.07)
Basophils Absolute: 0 10*3/uL (ref 0.0–0.1)
Basophils Relative: 0 %
Eosinophils Absolute: 0.2 10*3/uL (ref 0.0–0.5)
Eosinophils Relative: 3 %
HCT: 33.8 % — ABNORMAL LOW (ref 39.0–52.0)
Hemoglobin: 10 g/dL — ABNORMAL LOW (ref 13.0–17.0)
Immature Granulocytes: 0 %
Lymphocytes Relative: 10 %
Lymphs Abs: 0.8 10*3/uL (ref 0.7–4.0)
MCH: 27.5 pg (ref 26.0–34.0)
MCHC: 29.6 g/dL — ABNORMAL LOW (ref 30.0–36.0)
MCV: 93.1 fL (ref 80.0–100.0)
Monocytes Absolute: 1 10*3/uL (ref 0.1–1.0)
Monocytes Relative: 13 %
Neutro Abs: 6.2 10*3/uL (ref 1.7–7.7)
Neutrophils Relative %: 74 %
Platelets: 567 10*3/uL — ABNORMAL HIGH (ref 150–400)
RBC: 3.63 MIL/uL — ABNORMAL LOW (ref 4.22–5.81)
RDW: 14.7 % (ref 11.5–15.5)
WBC: 8.3 10*3/uL (ref 4.0–10.5)
nRBC: 0 % (ref 0.0–0.2)

## 2019-04-07 LAB — BASIC METABOLIC PANEL
Anion gap: 10 (ref 5–15)
BUN: 17 mg/dL (ref 8–23)
CO2: 27 mmol/L (ref 22–32)
Calcium: 9.1 mg/dL (ref 8.9–10.3)
Chloride: 98 mmol/L (ref 98–111)
Creatinine, Ser: 0.9 mg/dL (ref 0.61–1.24)
GFR calc Af Amer: >60 mL/min (ref >60)
GFR calc non Af Amer: >60 mL/min (ref >60)
Glucose, Bld: 111 mg/dL — ABNORMAL HIGH (ref 70–99)
Potassium: 4.8 mmol/L (ref 3.5–5.1)
Sodium: 135 mmol/L (ref 135–145)

## 2019-04-07 LAB — C-REACTIVE PROTEIN: CRP: 6.4 mg/dL — ABNORMAL HIGH (ref <1.0)

## 2019-04-08 ENCOUNTER — Encounter: Payer: Self-pay | Admitting: Medical

## 2019-04-08 ENCOUNTER — Ambulatory Visit (INDEPENDENT_AMBULATORY_CARE_PROVIDER_SITE_OTHER): Payer: Medicare Other | Admitting: Medical

## 2019-04-08 ENCOUNTER — Other Ambulatory Visit: Payer: Self-pay

## 2019-04-08 VITALS — BP 128/62 | HR 81 | Temp 96.9°F | Resp 16 | Ht 70.0 in | Wt 189.8 lb

## 2019-04-08 DIAGNOSIS — M545 Low back pain, unspecified: Secondary | ICD-10-CM

## 2019-04-08 DIAGNOSIS — M79604 Pain in right leg: Secondary | ICD-10-CM

## 2019-04-08 DIAGNOSIS — F329 Major depressive disorder, single episode, unspecified: Secondary | ICD-10-CM | POA: Diagnosis not present

## 2019-04-08 DIAGNOSIS — F419 Anxiety disorder, unspecified: Secondary | ICD-10-CM

## 2019-04-08 DIAGNOSIS — E119 Type 2 diabetes mellitus without complications: Secondary | ICD-10-CM

## 2019-04-08 DIAGNOSIS — F32A Depression, unspecified: Secondary | ICD-10-CM

## 2019-04-08 MED ORDER — BUSPIRONE HCL 7.5 MG PO TABS: ORAL_TABLET | ORAL | 0 refills | Status: DC

## 2019-04-08 NOTE — Progress Notes (Signed)
Subjective:    Brown ID: Luis Brown, male    DOB: 1946-07-06, 73 y.o.   MRN: [id number]  HPI  Pt in for evaluation recent anxious and states feeling agitated over chronic back pain and recent complications of loosening hardware post surgery, repeat surgery and discover of osteomyelitis and now need for picc line and IV antibiotics.  Recent ID specialist visit. Below are notes from that visit. Reviewed on Friday and again today.  HPI below by ID MD Luis Brown is a 73 year old white male with a history of degenerative disc disease and multiple spinal fusions who presents today for concern for a thoracic discitis/prosthetic joint infection.  Please note that Luis Brown's wife was present during today's exam and assisted with much of Luis surgical and medical history obtained.  Brown has a rather complex surgical history which in regards to his back began in 2015 Dr. Agapito Games performed an T8-L1 fusion to assist with left leg radicular pain and numbness.  Despite multiple visits to see his surgeon at [facility] postoperatively, Luis Brown states that his left leg numbness and direct low thoracic back pain returned within months after his surgery.  Imaging in 2018 showed that he had an L5-S1 rod breakage and loosening of Luis pedicle screws at that level.  He sought a second opinion with Dr. Louanne Skye late in 2018.  In November 2018 he had a replacement of Luis L5-S1 fusion segment and bone grafting was performed simultaneously.  Per Luis operative note reviewed, Luis broken rod was left intact at that time.  He reports several partial or near falls following this procedure, but he denies any total falls or direct trauma to his back in Luis past several years.  As his back pain and left leg radicular symptoms gradually returned, further imaging was performed that suggested screw loosening at Luis T9-T11 section of his spinal fusion.  On February 12, 2019 he returned to Luis OR with Dr. Donavan Burnet was found to have  screw loosening at Luis same levels so his fusion was extended superiorly to T5 and bone grafting was performed.  Initially, Luis Brown had a good postoperative course but over weeks 2 and 3 postoperatively he began developing pain in his back at that his operative site prompting plain films to be obtained.  X-rays showed a loss of fixation of Luis opposite left internal rod fastener at Luis site of his severe back pain and Luis development of acute loosening of Luis enteral rod fasteners and loss of fixation.  He continued to take increasing doses of oral narcotics with no relief in his back pain.  On March 12, 2019, he was taken back to Luis operating room where it was noted that his Harrington rod had loosened and nearly entirely migrated out of position.  A culture was obtained near Luis pedicle screw at T10 is a small amount of soft tissue with significant bone osteomalacia was observed.  Given concern for his spinal stability, his recently placed Harrington rods were removed and revised/replaced.  Operative cultures were followed and remained negative at Luis time of his hospital discharge on March 14, 2019.  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  I was contacted last week by Dr. Louanne Skye regarding Luis findings and concern for vertebral osteomyelitis/prosthetic joint infection.  Following this discussion, Luis Brown was started on oral doxycycline 100 mg p.o. twice daily.  Brown states he has not had drainage first following his  most recent surgeries no his other preceding surgeries but has had slight improvement in his back pain since beginning antibiotics.  He presents today for an initial evaluation for possible prosthetic joint infection to his spine.  Of note, Luis Brown admits to a fall as a child from a 20 foot height out of a tree but denies any known vertebral fracture or bony injury following this event.  He also states that while as a  child there was also concern that he may have contracted polio but there was never any supporting evidence of this.  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  1.  Thoracic vertebral osteomyelitis/prosthetic joint infection -review of Luis Brown's labs for Luis past several years shows that he has maintained a normal white blood cell count but has not had any associated CRP.  Like Dr. Louanne Skye, I have significant concern that Luis Brown's hardware loosening on multiple times over Luis last several years may have actually been due to indolent spinal infection all along.  This may explain Luis early migration of his Harrington rods despite no presence of overlying surgical site erythema or drainage prior to his most recent I&D and repetitive hardware fixation.  Luis pathogen isolated of Propionibacterium acnes is Luis third leading cause of periprosthetic joint infections and often will present with a subacute course.  It would be extremely unlikely that this pathogen would cause immediate loss of Harrington rods without pre-existing infection.  I spent a significant time discussing Luis need for a PICC line with Luis Brown and his wife and explained Luis process of administration of medications and lab monitoring from home while on IV antibiotics.  Additionally, we have scheduled Luis Brown for placement of a PICC line for tomorrow to begin an anticipated 8-week duration of IV induction therapy with Rocephin 2 g IV daily.  Tentative end date for his IV induction antibiotics would be May 31, 2019.  As it is anticipated  that Luis Brown will require retention of his extensive spinal hardware, I would plan for a minimum of 6 months of oral consolidative therapy following IV induction.  I shared with Luis Brown and his wife that this strategy does still have a risk of failure given Luis concern for extensive infection along his fusion apparatus and Luis development of biofilm may preclude eradication of his infection.  He expressed understanding and wished to proceed forward with PICC line placement and IV induction treatment.  I spent over 80 minutes of direct face-to-face contact with Luis Brown and his wife today gathering history, creating a treatment plan, and coordinating care.  Check a baseline CRP today.  2.  Diabetes-will check baseline labs with a CBC with differential and BMP today. We will continue to check these labs weekly while he remains on IV antibiotics.  I have instructed Luis Brown to aim for a glycemic control of 150 or less as best possible to optimize his infectious outcome. Pt had two infusions of antibiotic already.  Pt recently more anxious and more easily agitated. Pt in Luis past had used valium in Luis past at night. It helped with muscle  spasms in past and likely helped with anxiety. He states sometimes will 5 mg 3 times a week presently.   Pt is oxycodone ER 36 mg every 12 hours by Dr. Louanne Skye. Pt learning how to manage with pain. He has brace to help stabilize back.    Pt is on effexor xr for mood and anxiety.     Review of Systems  Constitutional: Negative for chills, fatigue and fever.  Respiratory: Negative for cough, chest tightness, shortness of breath and wheezing.   Cardiovascular: Negative for chest pain and palpitations.  Gastrointestinal: Negative for abdominal pain.  Genitourinary: Negative for decreased urine volume, difficulty urinating, dysuria, flank pain, frequency and urgency.  Musculoskeletal: Positive for back pain. Negative for neck pain and neck stiffness.  Skin:  Negative for rash.  Neurological: Negative for dizziness, seizures and light-headedness.  Hematological: Negative for adenopathy. Does not bruise/bleed easily.  Psychiatric/Behavioral: Positive for agitation and dysphoric mood. Luis Brown is nervous/anxious.        Mood up and down but pt states not real depressed.     Past Medical History:  Diagnosis Date   Acute pharyngitis 10/21/2013   Allergy    grass and pollen   Anxiety and depression 10/25/2011   BPH (benign prostatic hyperplasia) 04/23/2012   Chicken pox as a child   DDD (degenerative disc disease)    cervical responds to steroid injections and low back required surgery   DDD (degenerative disc disease), lumbosacral    Diabetes mellitus    pre   Dyspnea    ED (erectile dysfunction) 04/23/2012   Elevated BP    Esophageal reflux 02/10/2015   Fatigue    HTN (hypertension)    Hyperglycemia    preDM    Hyperlipidemia    Insomnia    Low back pain radiating to both legs 01/16/2017   Measles as a child   Medicare annual wellness visit, subsequent 02/10/2015   Overweight(278.02)    Personal history of colonic polyps 10/27/2012   Follows with [facility]   Pneumonia    " walking"   Preventative health care    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   Wears glasses      Social History   Socioeconomic History   Marital status: Married    Spouse name: Not on file   Number of children: Not on file   Years of education: Not on file   Highest education level: Not on file  Occupational History   Not on file  Social Needs   Financial resource strain: Not on file   Food insecurity    Worry: Not on file    Inability: Not on file   Transportation needs    Medical: Not on file    Non-medical: Not on file  Tobacco Use   Smoking status: Former Smoker    Packs/day: 1.00    Years: 20.00    Pack years: 20.00    Types: Cigarettes    Quit date: 07/18/1989    Years since quitting: 29.7    Smokeless tobacco: Never Used   Tobacco comment: off and on smoking for Luis 20 yrs  Substance and Sexual Activity   Alcohol use: Yes    Comment: drink or two a day- mixed drink   Drug use: No   Sexual activity: Yes    Comment: lives with wife, still working, no dietary restrictions, continues to exercise intermittently  Lifestyle   Physical activity    Days per week: Not on file  Minutes per session: Not on file   Stress: Not on file  Relationships   Social connections    Talks on phone: Not on file    Gets together: Not on file    Attends religious service: Not on file    Active member of club or organization: Not on file    Attends meetings of clubs or organizations: Not on file    Relationship status: Not on file   Intimate partner violence    Fear of current or ex partner: Not on file    Emotionally abused: Not on file    Physically abused: Not on file    Forced sexual activity: Not on file  Other Topics Concern   Not on file  Social History Narrative   Not on file    Past Surgical History:  Procedure Laterality Date   BACK SURGERY  2012 and 1994   Dr Margret Chance, screws and cage in low back   EYE SURGERY Bilateral    2016   HARDWARE REVISION  03/12/2019   REVISION OF HARDWARE THORACIC TEN-THORACIC ELEVEN WITH APPLICATION OF ADDITIONAL RODS AND ROD SLEEVES THORACIC EIGHT-THORACIC TWELVE 12-LUMBAR ONE (N/A )   LAMINECTOMY  02/12/2019   DECOMPRESSIVE LAMINECTOMY THORACIC NINE-THORACIC TEN AND THORACIC TEN-THORACIC ELEVEN, EXTENSION OF THORACOLUMBAR FUSION FROM THORACIC TEN TO THORACIC FIVE, LOCAL BONE GRAFT, ALLOGRAFT AND VIVIGEN (N/A)   SPINAL FUSION N/A 02/12/2019   Procedure: DECOMPRESSIVE LAMINECTOMY THORACIC NINE-THORACIC TEN AND THORACIC TEN-THORACIC ELEVEN, EXTENSION OF THORACOLUMBAR FUSION FROM THORACIC TEN TO THORACIC FIVE, LOCAL BONE GRAFT, ALLOGRAFT AND VIVIGEN;  Surgeon: Jessy Oto, MD;  Location: [place];  Service: Orthopedics;  Laterality: N/A;   DECOMPRESSIVE LAMINECTOMY THORACIC NINE-THORACIC TEN AND THORACIC TEN-THORACIC ELEVEN, EXTENSION OF TH   SPINAL FUSION N/A 03/12/2019   Procedure: REVISION OF HARDWARE THORACIC TEN-THORACIC ELEVEN WITH APPLICATION OF ADDITIONAL RODS AND ROD SLEEVES THORACIC EIGHT-THORACIC TWELVE 12-LUMBAR ONE;  Surgeon: Jessy Oto, MD;  Location: [place];  Service: Orthopedics;  Laterality: N/A;   TONSILLECTOMY     TONSILLECTOMY     as child   torn rotator cuff  2010   right    Family History  Problem Relation Age of Onset   Hypertension Mother    Diabetes Mother        type 2   Cancer Mother 54       breast in remission   Emphysema Father        smoker   COPD Father        smoker   Stroke Father 47       mini   Heart disease Father    Hypertension Sister    Hyperlipidemia Sister    Scoliosis Sister    Osteoporosis Sister    Hypertension Maternal Grandmother    Scoliosis Maternal Grandmother    Heart disease Maternal Grandfather    Heart disease Paternal Grandfather        smoker   Heart disease Daughter     No Known Allergies  Current Outpatient Medications on File Prior to Visit  Medication Sig Dispense Refill   aspirin 81 MG tablet Take 81 mg by mouth daily.     atorvastatin (LIPITOR) 20 MG tablet TAKE 1/2 TABLET BY MOUTH DAILY AT 6 PM 45 tablet 0   baclofen (LIORESAL) 10 MG tablet Take 1 tablet (10 mg total) by mouth 3 (three) times daily. 30 each 0   cefTRIAXone (ROCEPHIN) 1 g injection Inject 2 g into Luis muscle daily.  1120 mL 0   cetirizine (ZYRTEC) 10 MG tablet Take 10 mg by mouth daily. In Luis morning     diazepam (VALIUM) 10 MG tablet TAKE 1 TABLET(10 MG) BY MOUTH AT BEDTIME AS NEEDED (Brown taking differently: Take 5 mg by mouth at bedtime as needed for sleep. ) 30 tablet 1   doxycycline (ADOXA) 100 MG tablet Take 1 tablet (100 mg total) by mouth 2 (two) times daily. 28 tablet 0   famotidine (PEPCID) 40 MG tablet Take 1 tablet (40 mg total) by  mouth at bedtime. 90 tablet 0   gabapentin (NEURONTIN) 300 MG capsule Take 1 capsule (300 mg total) by mouth 3 (three) times daily. 270 capsule 3   glucose blood (CONTOUR NEXT TEST) test strip Check blood sugar once daily 100 each 12   lisinopril (ZESTRIL) 5 MG tablet Take 1 tablet (5 mg total) by mouth 2 (two) times daily. 180 tablet 1   Melatonin 10 MG TABS Take 10 mg by mouth at bedtime as needed (sleep).      metFORMIN (GLUCOPHAGE) 500 MG tablet TAKE ONE TABLET BY MOUTH ONE TIME DAILY  (Brown taking differently: Take 500 mg by mouth 2 (two) times daily with a meal. ) 90 tablet 0   oxyCODONE ER 36 MG C12A Take 1 capsule (36 mg total) by mouth every 12 (twelve) hours for 7 days. 14 capsule 0   oxyCODONE-acetaminophen (PERCOCET/ROXICET) 5-325 MG tablet Take 1 tablet by mouth every 6 (six) hours as needed for up to 7 days for severe pain. 40 tablet 0   sildenafil (REVATIO) 20 MG tablet TAKE ONE TO THREE TABLETS BY MOUTH DAILY AS NEEDED 50 tablet 1   tamsulosin (FLOMAX) 0.4 MG CAPS capsule TAKE TWO CAPSULES BY MOUTH DAILY  (Brown taking differently: Take 0.8 mg by mouth at bedtime. ) 180 capsule 0   testosterone cypionate (DEPOTESTOSTERONE CYPIONATE) 200 MG/ML injection Inject 1 mL (200 mg total) into Luis muscle every 14 (fourteen) days. 1 mL 5   tiZANidine (ZANAFLEX) 4 MG tablet Take 1 tablet (4 mg total) by mouth every 6 (six) hours as needed for muscle spasms. 30 tablet 0   venlafaxine XR (EFFEXOR-XR) 150 MG 24 hr capsule take 1 capsule by mouth one time daily with breakfast (Brown taking differently: Take 150 mg by mouth daily with breakfast. ) 90 capsule 0   Current Facility-Administered Medications on File Prior to Visit  Medication Dose Route Frequency Provider Last Rate Last Dose   cefTRIAXone (ROCEPHIN) 2 g in dextrose 5 % 50 mL IVPB  2 g Intravenous Q24H Powers, Evern Core, MD        There were no vitals taken for this visit.      Objective:   Physical Exam General  Appearance- Not in acute distress.    Chest and Lung Exam Auscultation: Breath sounds:-Normal. Clear even and unlabored. Adventitious sounds:- No Adventitious sounds.  Cardiovascular Auscultation:Rythm - Regular, rate and rythm. Heart Sounds -Normal heart sounds.  Abdomen Inspection:-Inspection Normal.  Palpation/Perucssion: Palpation and Percussion of Luis abdomen reveal- Non Tender, No Rebound tenderness, No rigidity(Guarding) and No Palpable abdominal masses.  Liver:-Normal.  Spleen:- Normal.   Back Mid lower t spine area tenderness to palpation. Pain on straight leg lift. Pain on lateral movements and flexion/extension of Luis spine.  Lower ext neurologic  L5-S1 sensation intact bilaterally. Normal patellar reflexes bilaterally. No foot drop bilaterally.          Assessment & Plan:  I am sorry to hear  you are going through Luis anxiety and agitation related to surgical complications, osteomyelitis and severe pain.  Continue current pain medications, use brace and get iv rocephin.  Luis safest way to treat anxiety is to give buspar and titrate up on doses. Take this daily. Continue effexor. Can continue valium occasionally if needed. Pharmacist thinks eventually best just to use one med and it may take some time to feel full effect of buspar.  Follow up 10 days or as needed  25 minutes spent with pt. 50% of time spent counseling on plan going forward.   Mackie Pai, PA-C

## 2019-04-08 NOTE — Patient Instructions (Addendum)
I am sorry to hear you are going through the anxiety and agitation related to surgical complications, osteomyelitis and severe pain.  Continue current pain medications, use brace and get iv rocephin.  The safest way to treat anxiety is to give buspar and titrate up on doses. Take this daily. Continue effexor. Can continue valium occasionally if needed. Pharmacist thinks eventually best just to use one anxiety  med and it may take some time to feel full effect of buspar. Explained to pt how would titrate buspar.  Follow up 10 days or as needed

## 2019-04-11 ENCOUNTER — Other Ambulatory Visit: Payer: Self-pay | Admitting: Radiology

## 2019-04-11 ENCOUNTER — Encounter: Payer: Self-pay | Admitting: Specialist

## 2019-04-11 MED ORDER — OXYCODONE-ACETAMINOPHEN 5-325 MG PO TABS: 1.0000 | ORAL_TABLET | Freq: Four times a day (QID) | ORAL | 0 refills | Status: DC | PRN

## 2019-04-11 MED ORDER — BACLOFEN 10 MG PO TABS: 10.0000 mg | ORAL_TABLET | Freq: Three times a day (TID) | ORAL | 0 refills | Status: DC

## 2019-04-12 ENCOUNTER — Other Ambulatory Visit: Payer: Self-pay | Admitting: Family Medicine

## 2019-04-12 NOTE — Telephone Encounter (Signed)
Rx valium refilled. Reviewed data base. Counseled pt on risk of meds used/caution.

## 2019-04-12 NOTE — Telephone Encounter (Signed)
Requesting:valium  Contract:yes UDS:low risk next screen 02/17/2019 Last OV:04/08/19 Next OV:04/22/19 Last Refill:01/24/19  #[id number] Database:   Please advise

## 2019-04-13 ENCOUNTER — Other Ambulatory Visit (HOSPITAL_COMMUNITY)
Admission: AD | Admit: 2019-04-13 | Discharge: 2019-04-13 | Disposition: A | Discharge status: Home / Self Care | Payer: Medicare Other | Source: Other Acute Inpatient Hospital | Attending: Infectious Diseases | Admitting: Infectious Diseases

## 2019-04-13 DIAGNOSIS — X58XXXA Exposure to other specified factors, initial encounter: Secondary | ICD-10-CM | POA: Insufficient documentation

## 2019-04-13 DIAGNOSIS — M4635 Infection of intervertebral disc (pyogenic), thoracolumbar region: Secondary | ICD-10-CM | POA: Diagnosis not present

## 2019-04-13 DIAGNOSIS — M4325 Fusion of spine, thoracolumbar region: Secondary | ICD-10-CM | POA: Diagnosis not present

## 2019-04-13 DIAGNOSIS — T8140XA Infection following a procedure, unspecified, initial encounter: Secondary | ICD-10-CM | POA: Insufficient documentation

## 2019-04-13 LAB — CBC WITH DIFFERENTIAL/PLATELET
Abs Immature Granulocytes: 0.03 10*3/uL (ref 0.00–0.07)
Basophils Absolute: 0 10*3/uL (ref 0.0–0.1)
Basophils Relative: 0 %
Eosinophils Absolute: 0.2 10*3/uL (ref 0.0–0.5)
Eosinophils Relative: 2 %
HCT: 34.5 % — ABNORMAL LOW (ref 39.0–52.0)
Hemoglobin: 10.2 g/dL — ABNORMAL LOW (ref 13.0–17.0)
Immature Granulocytes: 0 %
Lymphocytes Relative: 9 %
Lymphs Abs: 0.9 10*3/uL (ref 0.7–4.0)
MCH: 26.8 pg (ref 26.0–34.0)
MCHC: 29.6 g/dL — ABNORMAL LOW (ref 30.0–36.0)
MCV: 90.8 fL (ref 80.0–100.0)
Monocytes Absolute: 1.2 10*3/uL — ABNORMAL HIGH (ref 0.1–1.0)
Monocytes Relative: 12 %
Neutro Abs: 7.8 10*3/uL — ABNORMAL HIGH (ref 1.7–7.7)
Neutrophils Relative %: 77 %
Platelets: 553 10*3/uL — ABNORMAL HIGH (ref 150–400)
RBC: 3.8 MIL/uL — ABNORMAL LOW (ref 4.22–5.81)
RDW: 15.5 % (ref 11.5–15.5)
WBC: 10.2 10*3/uL (ref 4.0–10.5)
nRBC: 0 % (ref 0.0–0.2)

## 2019-04-13 LAB — C-REACTIVE PROTEIN: CRP: 5.9 mg/dL — ABNORMAL HIGH (ref <1.0)

## 2019-04-13 LAB — BASIC METABOLIC PANEL
Anion gap: 12 (ref 5–15)
BUN: 19 mg/dL (ref 8–23)
CO2: 26 mmol/L (ref 22–32)
Calcium: 9.2 mg/dL (ref 8.9–10.3)
Chloride: 98 mmol/L (ref 98–111)
Creatinine, Ser: 0.73 mg/dL (ref 0.61–1.24)
GFR calc Af Amer: >60 mL/min (ref >60)
GFR calc non Af Amer: >60 mL/min (ref >60)
Glucose, Bld: 91 mg/dL (ref 70–99)
Potassium: 5 mmol/L (ref 3.5–5.1)
Sodium: 136 mmol/L (ref 135–145)

## 2019-04-15 ENCOUNTER — Encounter: Payer: Self-pay | Admitting: Specialist

## 2019-04-16 ENCOUNTER — Other Ambulatory Visit: Payer: Self-pay | Admitting: Specialist

## 2019-04-16 ENCOUNTER — Encounter: Payer: Self-pay | Admitting: Specialist

## 2019-04-16 ENCOUNTER — Other Ambulatory Visit: Payer: Self-pay | Admitting: Radiology

## 2019-04-16 MED ORDER — OXYCODONE ER 18 MG PO C12A: 1.0000 | EXTENDED_RELEASE_CAPSULE | Freq: Two times a day (BID) | ORAL | 0 refills | Status: DC

## 2019-04-19 ENCOUNTER — Other Ambulatory Visit: Payer: Self-pay

## 2019-04-19 ENCOUNTER — Ambulatory Visit: Payer: Medicare Other

## 2019-04-19 ENCOUNTER — Other Ambulatory Visit: Payer: Self-pay | Admitting: Radiology

## 2019-04-19 ENCOUNTER — Telehealth: Payer: Self-pay

## 2019-04-19 MED ORDER — OXYCODONE-ACETAMINOPHEN 5-325 MG PO TABS: 1.0000 | ORAL_TABLET | Freq: Four times a day (QID) | ORAL | 0 refills | Status: DC | PRN

## 2019-04-19 NOTE — Telephone Encounter (Signed)
Copied from [place] [phone]. Topic: General - Other >> Apr 19, 2019  3:29 PM Rayann Heman wrote: Reason for CRM: pt called and stated that he missed a call regarding screening.

## 2019-04-19 NOTE — Telephone Encounter (Signed)
DONE

## 2019-04-20 ENCOUNTER — Other Ambulatory Visit: Payer: Self-pay | Admitting: Family Medicine

## 2019-04-20 ENCOUNTER — Other Ambulatory Visit (HOSPITAL_COMMUNITY)
Admission: AD | Admit: 2019-04-20 | Discharge: 2019-04-20 | Disposition: A | Discharge status: Home / Self Care | Payer: Medicare Other | Source: Ambulatory Visit | Attending: Infectious Diseases | Admitting: Infectious Diseases

## 2019-04-20 ENCOUNTER — Other Ambulatory Visit: Payer: Self-pay | Admitting: Medical

## 2019-04-20 DIAGNOSIS — M4325 Fusion of spine, thoracolumbar region: Secondary | ICD-10-CM | POA: Diagnosis not present

## 2019-04-20 DIAGNOSIS — M4635 Infection of intervertebral disc (pyogenic), thoracolumbar region: Secondary | ICD-10-CM | POA: Diagnosis not present

## 2019-04-20 DIAGNOSIS — T8140XA Infection following a procedure, unspecified, initial encounter: Secondary | ICD-10-CM | POA: Diagnosis not present

## 2019-04-20 DIAGNOSIS — M4620 Osteomyelitis of vertebra, site unspecified: Secondary | ICD-10-CM | POA: Diagnosis not present

## 2019-04-20 LAB — BASIC METABOLIC PANEL
Anion gap: 10 (ref 5–15)
BUN: 17 mg/dL (ref 8–23)
CO2: 26 mmol/L (ref 22–32)
Calcium: 9.1 mg/dL (ref 8.9–10.3)
Chloride: 100 mmol/L (ref 98–111)
Creatinine, Ser: 0.67 mg/dL (ref 0.61–1.24)
GFR calc Af Amer: >60 mL/min (ref >60)
GFR calc non Af Amer: >60 mL/min (ref >60)
Glucose, Bld: 69 mg/dL — ABNORMAL LOW (ref 70–99)
Potassium: 5.1 mmol/L (ref 3.5–5.1)
Sodium: 136 mmol/L (ref 135–145)

## 2019-04-20 LAB — CBC WITH DIFFERENTIAL/PLATELET
Abs Immature Granulocytes: 0.02 10*3/uL (ref 0.00–0.07)
Basophils Absolute: 0 10*3/uL (ref 0.0–0.1)
Basophils Relative: 0 %
Eosinophils Absolute: 0.2 10*3/uL (ref 0.0–0.5)
Eosinophils Relative: 2 %
HCT: 33.4 % — ABNORMAL LOW (ref 39.0–52.0)
Hemoglobin: 10 g/dL — ABNORMAL LOW (ref 13.0–17.0)
Immature Granulocytes: 0 %
Lymphocytes Relative: 10 %
Lymphs Abs: 0.9 10*3/uL (ref 0.7–4.0)
MCH: 26.5 pg (ref 26.0–34.0)
MCHC: 29.9 g/dL — ABNORMAL LOW (ref 30.0–36.0)
MCV: 88.6 fL (ref 80.0–100.0)
Monocytes Absolute: 1.1 10*3/uL — ABNORMAL HIGH (ref 0.1–1.0)
Monocytes Relative: 13 %
Neutro Abs: 6.4 10*3/uL (ref 1.7–7.7)
Neutrophils Relative %: 75 %
Platelets: 444 10*3/uL — ABNORMAL HIGH (ref 150–400)
RBC: 3.77 MIL/uL — ABNORMAL LOW (ref 4.22–5.81)
RDW: 15.5 % (ref 11.5–15.5)
WBC: 8.7 10*3/uL (ref 4.0–10.5)
nRBC: 0 % (ref 0.0–0.2)

## 2019-04-20 LAB — C-REACTIVE PROTEIN: CRP: 3.2 mg/dL — ABNORMAL HIGH (ref <1.0)

## 2019-04-21 DIAGNOSIS — M4635 Infection of intervertebral disc (pyogenic), thoracolumbar region: Secondary | ICD-10-CM | POA: Diagnosis not present

## 2019-04-21 DIAGNOSIS — M4325 Fusion of spine, thoracolumbar region: Secondary | ICD-10-CM | POA: Diagnosis not present

## 2019-04-22 ENCOUNTER — Other Ambulatory Visit: Payer: Self-pay | Admitting: Radiology

## 2019-04-22 ENCOUNTER — Encounter: Payer: Self-pay | Admitting: Specialist

## 2019-04-22 ENCOUNTER — Ambulatory Visit (INDEPENDENT_AMBULATORY_CARE_PROVIDER_SITE_OTHER): Payer: Medicare Other | Admitting: Medical

## 2019-04-22 ENCOUNTER — Other Ambulatory Visit: Payer: Self-pay

## 2019-04-22 VITALS — BP 106/48 | HR 86 | Temp 96.9°F | Resp 16 | Ht 70.0 in | Wt 192.0 lb

## 2019-04-22 DIAGNOSIS — F329 Major depressive disorder, single episode, unspecified: Secondary | ICD-10-CM | POA: Diagnosis not present

## 2019-04-22 DIAGNOSIS — R7989 Other specified abnormal findings of blood chemistry: Secondary | ICD-10-CM | POA: Diagnosis not present

## 2019-04-22 DIAGNOSIS — F419 Anxiety disorder, unspecified: Secondary | ICD-10-CM | POA: Diagnosis not present

## 2019-04-22 DIAGNOSIS — M869 Osteomyelitis, unspecified: Secondary | ICD-10-CM | POA: Diagnosis not present

## 2019-04-22 DIAGNOSIS — D649 Anemia, unspecified: Secondary | ICD-10-CM | POA: Diagnosis not present

## 2019-04-22 DIAGNOSIS — F32A Depression, unspecified: Secondary | ICD-10-CM

## 2019-04-22 MED ORDER — BACLOFEN 10 MG PO TABS: 10.0000 mg | ORAL_TABLET | Freq: Three times a day (TID) | ORAL | 0 refills | Status: DC

## 2019-04-22 MED ORDER — TESTOSTERONE CYPIONATE 200 MG/ML IM SOLN
200.0000 mg | Freq: Once | INTRAMUSCULAR | Status: AC
  Administered 2019-04-22: 200 mg via INTRAMUSCULAR

## 2019-04-22 NOTE — Patient Instructions (Addendum)
Your anxiety and depression has improved a lot. Please continue with buspar. If mood worsens let us know.  Glad to see your c reactive protein has decreased. Continue rocephin.  For low testosterone you got testosterone injection today. Rechedule for next.   For anemia will get iron studies and order stool cards for blood. Please reach out to gi to get your colonoscopy rescheduled.  Follow up date to be determind after lab reviewed.

## 2019-04-22 NOTE — Progress Notes (Signed)
Subjective:    Patient ID: ANKUSH WIDRIG, male    DOB: 12-05-1945, 73 y.o.   MRN: [id number]  HPI  Patient here to follow-up.  He had been experiencing increasing anxiety/depression and frustration following his long history of chronic back pain, with multiple back surgeries and complications over the years. Has recently been doing IV ABX at home via PICC line. ID is following.  At visit 2 weeks ago he was started on Buspar. He started with half tablet TID, but felt like it was making him too tired. Last week he changed to a half tablet BID and reports he is feeling much better. Fatigue has improved since decreasing the dose. Reports his mood is much improved. He would like to continue taking it the way he currently is. No side effects. He states he has not needed his valium nearly as much since starting the Buspar.   He states feels energetic now and states his anxiety is now controlled.  Pt pain in back is controlled for most point. Still on narcotic.  Pt still getting rocephin IV for his osteomyelitis.   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 Pt c reactive protein level had come done.  Pt has gained some weight and eating better. No pedal edema. No dyspnea. Lost 20 lb after surgery.  Pt has anemia. His colonoscopy was canceled due to pandemic.    Review of Systems  Constitutional: Negative for chills, fatigue and fever.  HENT: Negative for congestion, ear pain and sore throat.   Respiratory: Negative for chest tightness and shortness of breath.   Cardiovascular: Negative for chest pain and palpitations.  Gastrointestinal: Negative for abdominal pain, blood in stool, constipation and diarrhea.  Genitourinary: Negative for dysuria, frequency, hematuria and urgency.  Musculoskeletal: Positive for back pain.       No new pains. Surgical pains well controlled on current regimen  Skin: Negative for rash.  Neurological:  Negative for dizziness, light-headedness and headaches.  Psychiatric/Behavioral: Negative for confusion and sleep disturbance. The patient is not nervous/anxious.        Anxiety/frustration much improved. States "things just don't really bother me now."    Past Medical History:  Diagnosis Date  . Acute pharyngitis 10/21/2013  . Allergy    grass and pollen  . Anxiety and depression 10/25/2011  . BPH (benign prostatic hyperplasia) 04/23/2012  . Chicken pox as a child  . DDD (degenerative disc disease)    cervical responds to steroid injections and low back required surgery  . DDD (degenerative disc disease), lumbosacral   . Diabetes mellitus    pre  . Dyspnea   . ED (erectile dysfunction) 04/23/2012  . Elevated BP   . Esophageal reflux 02/10/2015  . Fatigue   . HTN (hypertension)   . Hyperglycemia    preDM   . Hyperlipidemia   . Insomnia   . Low back pain radiating to both legs 01/16/2017  . Measles as a child  . Medicare annual wellness visit, subsequent 02/10/2015  . Overweight(278.02)   . Personal history of colonic polyps 10/27/2012   Follows with [facility]  . Pneumonia    " walking"  . Preventative health care   . 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  . Wears glasses      Social History   Socioeconomic History  . Marital status: Married    Spouse name: Not on file  . Number of children: Not on file  . Years  of education: Not on file  . Highest education level: Not on file  Occupational History  . Not on file  Social Needs  . Financial resource strain: Not on file  . Food insecurity    Worry: Not on file    Inability: Not on file  . Transportation needs    Medical: Not on file    Non-medical: Not on file  Tobacco Use  . Smoking status: Former Smoker    Packs/day: 1.00    Years: 20.00    Pack years: 20.00    Types: Cigarettes    Quit date: 07/18/1989    Years since quitting: 29.7  . Smokeless tobacco: Never Used  . Tobacco comment: off and on smoking  for the 20 yrs  Substance and Sexual Activity  . Alcohol use: Yes    Comment: drink or two a day- mixed drink  . Drug use: No  . Sexual activity: Yes    Comment: lives with wife, still working, no dietary restrictions, continues to exercise intermittently  Lifestyle  . Physical activity    Days per week: Not on file    Minutes per session: Not on file  . Stress: Not on file  Relationships  . Social Herbalist on phone: Not on file    Gets together: Not on file    Attends religious service: Not on file    Active member of club or organization: Not on file    Attends meetings of clubs or organizations: Not on file    Relationship status: Not on file  . Intimate partner violence    Fear of current or ex partner: Not on file    Emotionally abused: Not on file    Physically abused: Not on file    Forced sexual activity: Not on file  Other Topics Concern  . Not on file  Social History Narrative  . Not on file    Past Surgical History:  Procedure Laterality Date  . BACK SURGERY  2012 and 1994   Dr Margret Chance, screws and cage in low back  . EYE SURGERY Bilateral    2016  . HARDWARE REVISION  03/12/2019   REVISION OF HARDWARE THORACIC TEN-THORACIC ELEVEN WITH APPLICATION OF ADDITIONAL RODS AND ROD SLEEVES THORACIC EIGHT-THORACIC TWELVE 12-LUMBAR ONE (N/A )  . LAMINECTOMY  02/12/2019   DECOMPRESSIVE LAMINECTOMY THORACIC NINE-THORACIC TEN AND THORACIC TEN-THORACIC ELEVEN, EXTENSION OF THORACOLUMBAR FUSION FROM THORACIC TEN TO THORACIC FIVE, LOCAL BONE GRAFT, ALLOGRAFT AND VIVIGEN (N/A)  . SPINAL FUSION N/A 02/12/2019   Procedure: DECOMPRESSIVE LAMINECTOMY THORACIC NINE-THORACIC TEN AND THORACIC TEN-THORACIC ELEVEN, EXTENSION OF THORACOLUMBAR FUSION FROM THORACIC TEN TO THORACIC FIVE, LOCAL BONE GRAFT, ALLOGRAFT AND VIVIGEN;  Surgeon: Jessy Oto, MD;  Location: [facility];  Service: Orthopedics;  Laterality: N/A;  DECOMPRESSIVE LAMINECTOMY THORACIC NINE-THORACIC TEN AND THORACIC  TEN-THORACIC ELEVEN, EXTENSION OF TH  . SPINAL FUSION N/A 03/12/2019   Procedure: REVISION OF HARDWARE THORACIC TEN-THORACIC ELEVEN WITH APPLICATION OF ADDITIONAL RODS AND ROD SLEEVES THORACIC EIGHT-THORACIC TWELVE 12-LUMBAR ONE;  Surgeon: Jessy Oto, MD;  Location: [facility];  Service: Orthopedics;  Laterality: N/A;  . TONSILLECTOMY    . TONSILLECTOMY     as child  . torn rotator cuff  2010   right    Family History  Problem Relation Age of Onset  . Hypertension Mother   . Diabetes Mother        type 2  . Cancer Mother 6  breast in remission  . Emphysema Father        smoker  . COPD Father        smoker  . Stroke Father 74       mini  . Heart disease Father   . Hypertension Sister   . Hyperlipidemia Sister   . Scoliosis Sister   . Osteoporosis Sister   . Hypertension Maternal Grandmother   . Scoliosis Maternal Grandmother   . Heart disease Maternal Grandfather   . Heart disease Paternal Grandfather        smoker  . Heart disease Daughter     No Known Allergies  Current Outpatient Medications on File Prior to Visit  Medication Sig Dispense Refill  . aspirin 81 MG tablet Take 81 mg by mouth daily.    Marland Kitchen atorvastatin (LIPITOR) 20 MG tablet TAKE 1/2 TABLET BY MOUTH DAILY AT 6 PM 45 tablet 0  . baclofen (LIORESAL) 10 MG tablet Take 1 tablet (10 mg total) by mouth 3 (three) times daily. 30 each 0  . busPIRone (BUSPAR) 7.5 MG tablet 1-2 tab po tid for anxiety 90 tablet 0  . cefTRIAXone (ROCEPHIN) 1 g injection Inject 2 g into the muscle daily. 1120 mL 0  . cetirizine (ZYRTEC) 10 MG tablet Take 10 mg by mouth daily. In the morning    . diazepam (VALIUM) 10 MG tablet TAKE 1 TABLET(10 MG) BY MOUTH AT BEDTIME AS NEEDED 30 tablet 1  . doxycycline (ADOXA) 100 MG tablet Take 1 tablet (100 mg total) by mouth 2 (two) times daily. 28 tablet 0  . famotidine (PEPCID) 40 MG tablet Take 1 tablet (40 mg total) by mouth at bedtime. 90 tablet 0  . gabapentin (NEURONTIN) 300 MG capsule  Take 1 capsule (300 mg total) by mouth 3 (three) times daily. 270 capsule 3  . glucose blood (CONTOUR NEXT TEST) test strip Check blood sugar once daily 100 each 12  . lisinopril (ZESTRIL) 5 MG tablet Take 1 tablet (5 mg total) by mouth 2 (two) times daily. 180 tablet 1  . Melatonin 10 MG TABS Take 10 mg by mouth at bedtime as needed (sleep).     . metFORMIN (GLUCOPHAGE) 500 MG tablet Take 1 tablet (500 mg total) by mouth 2 (two) times daily with a meal. 90 tablet 3  . oxyCODONE ER 18 MG C12A Take 1 tablet by mouth every 12 (twelve) hours. Taper off this medication it is over a month since surgery 14 capsule 0  . oxyCODONE-acetaminophen (PERCOCET/ROXICET) 5-325 MG tablet Take 1 tablet by mouth every 6 (six) hours as needed for up to 7 days for severe pain. 40 tablet 0  . sildenafil (REVATIO) 20 MG tablet TAKE ONE TO THREE TABLETS BY MOUTH DAILY AS NEEDED 50 tablet 1  . tamsulosin (FLOMAX) 0.4 MG CAPS capsule TAKE TWO CAPSULES BY MOUTH DAILY  (Patient taking differently: Take 0.8 mg by mouth at bedtime. ) 180 capsule 0  . testosterone cypionate (DEPOTESTOSTERONE CYPIONATE) 200 MG/ML injection Inject 1 mL (200 mg total) into the muscle every 14 (fourteen) days. 1 mL 5  . tiZANidine (ZANAFLEX) 4 MG tablet Take 1 tablet (4 mg total) by mouth every 6 (six) hours as needed for muscle spasms. 30 tablet 0  . venlafaxine XR (EFFEXOR-XR) 150 MG 24 hr capsule take 1 capsule by mouth one time daily with breakfast (Patient taking differently: Take 150 mg by mouth daily with breakfast. ) 90 capsule 0  . oxyCODONE ER 36 MG C12A  Take 1 capsule (36 mg total) by mouth every 12 (twelve) hours for 7 days. 14 capsule 0   Current Facility-Administered Medications on File Prior to Visit  Medication Dose Route Frequency Provider Last Rate Last Dose  . cefTRIAXone (ROCEPHIN) 2 g in dextrose 5 % 50 mL IVPB  2 g Intravenous Q24H Powers, Evern Core, MD        BP (!) 106/48   Pulse 86   Temp (!) 96.9 F (36.1 C) (Temporal)    Resp 16   Ht 5\' 10"  (1.778 m)   Wt 192 lb (87.1 kg)   SpO2 100%   BMI 27.55 kg/m       Objective:   Physical Exam  General- No acute distress. Pleasant patient. Neck- Full range of motion, no jvd Lungs- Clear, even and unlabored. Heart- regular rate and rhythm. Neurologic- CNII- XII grossly intact.      Assessment & Plan:  Your anxiety and depression has improved a lot. Please continue with buspar. If mood worsens let us know.  Glad to see your c reactive protein has decreased. Continue rocephin.  For low testosterone you got testosterone injection today. Rechedule for next.   For anemia will get iron studies and order stool cards for blood. Please reach out to gi to get your colonoscopy rescheduled.  Follow up date to be determind after lab reviewed.  25 minutes spent with pt. 50% of time spent counseling pt on plan going forward.  NP student Caleen Jobs saw pt first and interviewed. Then I evaluated pt, modified note as needed and made tx decisions.

## 2019-04-23 ENCOUNTER — Telehealth: Payer: Self-pay | Admitting: Medical

## 2019-04-23 LAB — IRON,TIBC AND FERRITIN PANEL
%SAT: 8 % (calc) — ABNORMAL LOW (ref 20–48)
Ferritin: 17 ng/mL — ABNORMAL LOW (ref 24–380)
Iron: 25 ug/dL — ABNORMAL LOW (ref 50–180)
TIBC: 329 mcg/dL (calc) (ref 250–425)

## 2019-04-23 MED ORDER — FERROUS FUMARATE 325 (106 FE) MG PO TABS: 1.0000 | ORAL_TABLET | Freq: Every day | ORAL | 2 refills | Status: DC

## 2019-04-23 NOTE — Telephone Encounter (Signed)
Rx iron sent to pt pharmacy. °

## 2019-04-24 ENCOUNTER — Encounter: Payer: Self-pay | Admitting: Medical

## 2019-04-25 ENCOUNTER — Encounter: Payer: Self-pay | Admitting: Surgery

## 2019-04-25 ENCOUNTER — Ambulatory Visit (INDEPENDENT_AMBULATORY_CARE_PROVIDER_SITE_OTHER): Payer: Medicare Other | Admitting: Surgery

## 2019-04-25 ENCOUNTER — Other Ambulatory Visit: Payer: Self-pay

## 2019-04-25 ENCOUNTER — Ambulatory Visit: Payer: Self-pay

## 2019-04-25 VITALS — BP 111/57 | HR 65 | Ht 70.0 in | Wt 185.0 lb

## 2019-04-25 DIAGNOSIS — Z981 Arthrodesis status: Secondary | ICD-10-CM

## 2019-04-25 NOTE — Progress Notes (Signed)
73 year old white male who is now 6 weeks out from his thoracic fusion returns.  He continues to have some soreness as to be expected in his back.  He is still needing to take a fair amount of pain medication.  States that his pain medications were recently decreased by Dr. Louanne Skye.  Infectious disease physician also had PICC line placed.   Exam Surgical incision is well-healed.  He is ambulating well.   X-rays Reviewed by Dr. Louanne Skye.  Hardware intact.  No complicating features.   Plan Patient will continue wearing his brace.  Follow-up in 6 weeks for recheck with Dr. Louanne Skye.  Advised patient that Dr. Louanne Skye stated that we will continue to wean down his pain medications.

## 2019-04-26 ENCOUNTER — Encounter: Payer: Self-pay | Admitting: Specialist

## 2019-04-26 ENCOUNTER — Other Ambulatory Visit: Payer: Self-pay | Admitting: Radiology

## 2019-04-26 ENCOUNTER — Other Ambulatory Visit: Payer: Self-pay | Admitting: Specialist

## 2019-04-26 MED ORDER — OXYCODONE-ACETAMINOPHEN 5-325 MG PO TABS: 1.0000 | ORAL_TABLET | Freq: Three times a day (TID) | ORAL | 0 refills | Status: DC | PRN

## 2019-04-27 ENCOUNTER — Other Ambulatory Visit (HOSPITAL_COMMUNITY)
Admission: RE | Admit: 2019-04-27 | Discharge: 2019-04-27 | Disposition: A | Discharge status: Home / Self Care | Payer: Medicare Other | Source: Other Acute Inpatient Hospital | Attending: Infectious Diseases | Admitting: Infectious Diseases

## 2019-04-27 DIAGNOSIS — M4325 Fusion of spine, thoracolumbar region: Secondary | ICD-10-CM | POA: Diagnosis not present

## 2019-04-27 DIAGNOSIS — M4620 Osteomyelitis of vertebra, site unspecified: Secondary | ICD-10-CM | POA: Diagnosis not present

## 2019-04-27 DIAGNOSIS — T8140XA Infection following a procedure, unspecified, initial encounter: Secondary | ICD-10-CM | POA: Insufficient documentation

## 2019-04-27 DIAGNOSIS — M4635 Infection of intervertebral disc (pyogenic), thoracolumbar region: Secondary | ICD-10-CM | POA: Diagnosis not present

## 2019-04-27 LAB — CBC WITH DIFFERENTIAL/PLATELET
Abs Immature Granulocytes: 0.01 10*3/uL (ref 0.00–0.07)
Basophils Absolute: 0 10*3/uL (ref 0.0–0.1)
Basophils Relative: 0 %
Eosinophils Absolute: 0.2 10*3/uL (ref 0.0–0.5)
Eosinophils Relative: 3 %
HCT: 32.5 % — ABNORMAL LOW (ref 39.0–52.0)
Hemoglobin: 9.7 g/dL — ABNORMAL LOW (ref 13.0–17.0)
Immature Granulocytes: 0 %
Lymphocytes Relative: 8 %
Lymphs Abs: 0.5 10*3/uL — ABNORMAL LOW (ref 0.7–4.0)
MCH: 26.2 pg (ref 26.0–34.0)
MCHC: 29.8 g/dL — ABNORMAL LOW (ref 30.0–36.0)
MCV: 87.8 fL (ref 80.0–100.0)
Monocytes Absolute: 1 10*3/uL (ref 0.1–1.0)
Monocytes Relative: 14 %
Neutro Abs: 5.3 10*3/uL (ref 1.7–7.7)
Neutrophils Relative %: 75 %
Platelets: 464 10*3/uL — ABNORMAL HIGH (ref 150–400)
RBC: 3.7 MIL/uL — ABNORMAL LOW (ref 4.22–5.81)
RDW: 15.8 % — ABNORMAL HIGH (ref 11.5–15.5)
WBC: 7 10*3/uL (ref 4.0–10.5)
nRBC: 0 % (ref 0.0–0.2)

## 2019-04-27 LAB — BASIC METABOLIC PANEL
Anion gap: 11 (ref 5–15)
BUN: 19 mg/dL (ref 8–23)
CO2: 26 mmol/L (ref 22–32)
Calcium: 9 mg/dL (ref 8.9–10.3)
Chloride: 97 mmol/L — ABNORMAL LOW (ref 98–111)
Creatinine, Ser: 0.78 mg/dL (ref 0.61–1.24)
GFR calc Af Amer: >60 mL/min (ref >60)
GFR calc non Af Amer: >60 mL/min (ref >60)
Glucose, Bld: 149 mg/dL — ABNORMAL HIGH (ref 70–99)
Potassium: 4.7 mmol/L (ref 3.5–5.1)
Sodium: 134 mmol/L — ABNORMAL LOW (ref 135–145)

## 2019-04-27 LAB — C-REACTIVE PROTEIN: CRP: 5.9 mg/dL — ABNORMAL HIGH (ref <1.0)

## 2019-04-28 ENCOUNTER — Other Ambulatory Visit: Payer: Self-pay | Admitting: Family Medicine

## 2019-04-28 DIAGNOSIS — M4325 Fusion of spine, thoracolumbar region: Secondary | ICD-10-CM | POA: Diagnosis not present

## 2019-04-28 DIAGNOSIS — M4635 Infection of intervertebral disc (pyogenic), thoracolumbar region: Secondary | ICD-10-CM | POA: Diagnosis not present

## 2019-05-01 ENCOUNTER — Ambulatory Visit: Payer: Medicare Other | Admitting: Surgery

## 2019-05-01 ENCOUNTER — Other Ambulatory Visit: Payer: Self-pay | Admitting: Specialist

## 2019-05-01 ENCOUNTER — Other Ambulatory Visit: Payer: Self-pay | Admitting: Medical

## 2019-05-01 NOTE — Telephone Encounter (Signed)
Rx refill buspar sent to pt pharmacy. Advise pt take 1 tablet for anxiety but if anxiety worsening has option to take 2 tabs if needed.

## 2019-05-01 NOTE — Telephone Encounter (Signed)
Pt requesting 90 day supply on buspirone. Please advise

## 2019-05-02 ENCOUNTER — Encounter: Payer: Self-pay | Admitting: Specialist

## 2019-05-04 ENCOUNTER — Other Ambulatory Visit: Payer: Self-pay | Admitting: Medical

## 2019-05-04 DIAGNOSIS — M4325 Fusion of spine, thoracolumbar region: Secondary | ICD-10-CM | POA: Diagnosis not present

## 2019-05-04 DIAGNOSIS — M4635 Infection of intervertebral disc (pyogenic), thoracolumbar region: Secondary | ICD-10-CM | POA: Diagnosis not present

## 2019-05-05 ENCOUNTER — Other Ambulatory Visit (HOSPITAL_COMMUNITY)
Admission: AD | Admit: 2019-05-05 | Discharge: 2019-05-05 | Disposition: A | Discharge status: Home / Self Care | Payer: Medicare Other | Source: Other Acute Inpatient Hospital | Attending: Infectious Diseases | Admitting: Infectious Diseases

## 2019-05-05 DIAGNOSIS — T8140XA Infection following a procedure, unspecified, initial encounter: Secondary | ICD-10-CM | POA: Diagnosis not present

## 2019-05-05 DIAGNOSIS — M4635 Infection of intervertebral disc (pyogenic), thoracolumbar region: Secondary | ICD-10-CM | POA: Diagnosis not present

## 2019-05-05 DIAGNOSIS — M4325 Fusion of spine, thoracolumbar region: Secondary | ICD-10-CM | POA: Diagnosis not present

## 2019-05-05 DIAGNOSIS — Z981 Arthrodesis status: Secondary | ICD-10-CM | POA: Insufficient documentation

## 2019-05-05 DIAGNOSIS — E871 Hypo-osmolality and hyponatremia: Secondary | ICD-10-CM | POA: Insufficient documentation

## 2019-05-05 LAB — CBC WITH DIFFERENTIAL/PLATELET
Abs Immature Granulocytes: 0.03 10*3/uL (ref 0.00–0.07)
Basophils Absolute: 0 10*3/uL (ref 0.0–0.1)
Basophils Relative: 0 %
Eosinophils Absolute: 0.2 10*3/uL (ref 0.0–0.5)
Eosinophils Relative: 2 %
HCT: 33.4 % — ABNORMAL LOW (ref 39.0–52.0)
Hemoglobin: 9.8 g/dL — ABNORMAL LOW (ref 13.0–17.0)
Immature Granulocytes: 0 %
Lymphocytes Relative: 6 %
Lymphs Abs: 0.6 10*3/uL — ABNORMAL LOW (ref 0.7–4.0)
MCH: 25.3 pg — ABNORMAL LOW (ref 26.0–34.0)
MCHC: 29.3 g/dL — ABNORMAL LOW (ref 30.0–36.0)
MCV: 86.3 fL (ref 80.0–100.0)
Monocytes Absolute: 0.9 10*3/uL (ref 0.1–1.0)
Monocytes Relative: 11 %
Neutro Abs: 7.1 10*3/uL (ref 1.7–7.7)
Neutrophils Relative %: 81 %
Platelets: 403 10*3/uL — ABNORMAL HIGH (ref 150–400)
RBC: 3.87 MIL/uL — ABNORMAL LOW (ref 4.22–5.81)
RDW: 17 % — ABNORMAL HIGH (ref 11.5–15.5)
WBC: 8.9 10*3/uL (ref 4.0–10.5)
nRBC: 0 % (ref 0.0–0.2)

## 2019-05-05 LAB — BASIC METABOLIC PANEL
Anion gap: 13 (ref 5–15)
BUN: 16 mg/dL (ref 8–23)
CO2: 22 mmol/L (ref 22–32)
Calcium: 8.6 mg/dL — ABNORMAL LOW (ref 8.9–10.3)
Chloride: 95 mmol/L — ABNORMAL LOW (ref 98–111)
Creatinine, Ser: 0.68 mg/dL (ref 0.61–1.24)
GFR calc Af Amer: >60 mL/min (ref >60)
GFR calc non Af Amer: >60 mL/min (ref >60)
Glucose, Bld: 208 mg/dL — ABNORMAL HIGH (ref 70–99)
Potassium: 3.9 mmol/L (ref 3.5–5.1)
Sodium: 130 mmol/L — ABNORMAL LOW (ref 135–145)

## 2019-05-05 LAB — C-REACTIVE PROTEIN: CRP: 2.1 mg/dL — ABNORMAL HIGH (ref <1.0)

## 2019-05-07 ENCOUNTER — Encounter: Payer: Self-pay | Admitting: Specialist

## 2019-05-08 ENCOUNTER — Ambulatory Visit (INDEPENDENT_AMBULATORY_CARE_PROVIDER_SITE_OTHER): Payer: Medicare Other | Admitting: Infectious Diseases

## 2019-05-08 ENCOUNTER — Encounter: Payer: Self-pay | Admitting: Infectious Diseases

## 2019-05-08 ENCOUNTER — Other Ambulatory Visit: Payer: Self-pay

## 2019-05-08 VITALS — BP 137/68 | HR 90 | Temp 98.3°F | Wt 193.0 lb

## 2019-05-08 DIAGNOSIS — F1010 Alcohol abuse, uncomplicated: Secondary | ICD-10-CM

## 2019-05-08 DIAGNOSIS — E871 Hypo-osmolality and hyponatremia: Secondary | ICD-10-CM | POA: Diagnosis not present

## 2019-05-08 DIAGNOSIS — M4620 Osteomyelitis of vertebra, site unspecified: Secondary | ICD-10-CM | POA: Diagnosis not present

## 2019-05-08 DIAGNOSIS — M4624 Osteomyelitis of vertebra, thoracic region: Secondary | ICD-10-CM | POA: Diagnosis not present

## 2019-05-08 DIAGNOSIS — T8450XA Infection and inflammatory reaction due to unspecified internal joint prosthesis, initial encounter: Secondary | ICD-10-CM

## 2019-05-08 NOTE — Patient Instructions (Signed)
Continue rocephin daily as previously instructed. Return to clinic in 3 weeks for appointment with Dr. Prince Rome.

## 2019-05-08 NOTE — Progress Notes (Signed)
Subjective:    [name], male    DOB: [birth year], 73 y.o.   MRN: [id number]  HPI the patient is a 73 year old white male with a history of degenerative disc disease and multiple spinal fusions who presents today for a follow up visit for a thoracic discitis/prosthetic joint infection.  He was last seen in our clinic on April 04, 2019 at which time he was preparing to have a PICC line placed and initiate IV antibiotics for treatment of her from his vertebral osteomyelitis/discitis.  Please note that the patient's wife was again present during today's exam and assisted with much of the history obtained.  He has a rather complex surgical history which in regards to his back began in 2015 Dr. Agapito Games performed an T8-L1 fusion to assist with left leg radicular pain and numbness.  Despite multiple visits to see his surgeon at [facility] postoperatively, the patient states that his left leg numbness and direct low thoracic back pain returned within months after his surgery.  Imaging in 2018 showed that he had an L5-S1 rod breakage and loosening of the pedicle screws at that level.  He sought a second opinion with Dr. Louanne Skye late in 2018.  In November 2018, he had a replacement of the L5-S1 fusion segment and bone grafting was performed simultaneously.  Per the operative note reviewed, the broken rod was left intact at that time.  He reports several partial or near falls following this procedure, but he denies any total falls or direct trauma to his back in the past several years.  As his back pain and left leg radicular symptoms gradually returned, further imaging was performed that suggested screw loosening at the T9-T11 section of his spinal fusion.  On February 12, 2019 he returned to the OR with Dr. Louanne Skye and was found to have screw loosening at the same levels, so his fusion was extended superiorly to T5 and bone grafting was performed.  Initially, the patient had a good postoperative  course, but over weeks 2 and 3 postoperatively, he began developing pain in his back at his operative site, prompting plain films to be obtained.  X-rays showed a loss of fixation of the opposite left internal rod fastener at the site of his severe back pain and the development of acute loosening of the interval rod fasteners and loss of fixation.  On March 12, 2019, he was taken back to the operating room where it was noted that his Harrington rod had loosened and nearly entirely migrated out of position.  A culture was obtained near the pedicle screw at T10 as a small amount of soft tissue with significant bone osteomalacia was observed.  Given concern for his spinal stability, his recently placed Harrington rods were removed and revised/replaced.  Operative cultures were followed and remained negative at the time of his hospital discharge on March 14, 2019.  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  Of note, the patient admits to a fall as a child from a 20 foot height out of a tree but denies any known vertebral fracture or bony injury following this event.  He also states that while as a child there was also concern that he may have contracted polio but there was never any supporting evidence of this.  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  Following my last visit with the patient, he had a PICC line placed and was transition from the oral doxycycline that he was previously taking to once daily IV Rocephin.  Since initiating IV antibiotics, his CRP has improved from 57.7 to his current level of 2.1 on labs obtained from May 05, 2019.  His white blood cell count has remained normal at 8900, but his sodium level has progressively declined, registering at 130 this past week.  He does state that while he was  successful initially and decreasing his alcohol intake, he did drink at least 3 glasses of bourbon on Saturday the day before his labs were drawn while he was watching a football game with his son.  I had a rather prolonged discussion with the patient regarding his alcohol use over the years and his own admission of using it as an anesthetic to assist with his back pain.  He admits that he has been struggling to cope with his chronic back pain over the past several years and while his acute back pain has improved significantly over the last 4 weeks with antibiotic treatment, he still struggles with numbness in his legs and low back pain.  He presents today in his hard corset spinal brace at today's visit.  He and his wife both state that he has maintained an adequate appetite over the last several weeks and he has missed no doses of his Rocephin since initiating treatment.   Past Medical History:  Diagnosis Date   Acute pharyngitis 10/21/2013   Allergy    grass and pollen   Anxiety and depression 10/25/2011   BPH (benign prostatic hyperplasia) 04/23/2012   Chicken pox as a child   DDD (degenerative disc disease)    cervical responds to steroid injections and low back required surgery   DDD (degenerative disc disease), lumbosacral    Diabetes mellitus    pre   Dyspnea    ED (erectile dysfunction) 04/23/2012   Elevated BP    Esophageal reflux 02/10/2015   Fatigue    HTN (hypertension)    Hyperglycemia    preDM    Hyperlipidemia    Insomnia    Low back pain radiating to both legs 01/16/2017   Measles as a child   Medicare annual wellness visit, subsequent 02/10/2015   Overweight(278.02)    Personal history of colonic polyps 10/27/2012   Follows with [facility]   Pneumonia    " walking"   Preventative health care    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   Wears glasses     Past Surgical History:  Procedure Laterality Date   BACK SURGERY  2012 and 1994    Dr Margret Chance, screws and cage in low back   EYE SURGERY Bilateral    2016   HARDWARE REVISION  03/12/2019   REVISION OF HARDWARE THORACIC TEN-THORACIC ELEVEN WITH APPLICATION OF ADDITIONAL RODS AND ROD SLEEVES THORACIC EIGHT-THORACIC TWELVE 12-LUMBAR ONE (N/A )   LAMINECTOMY  02/12/2019   DECOMPRESSIVE LAMINECTOMY THORACIC NINE-THORACIC TEN AND THORACIC TEN-THORACIC ELEVEN, EXTENSION OF THORACOLUMBAR FUSION FROM THORACIC TEN TO THORACIC FIVE, LOCAL BONE GRAFT, ALLOGRAFT AND VIVIGEN (N/A)   SPINAL FUSION N/A 02/12/2019   Procedure: DECOMPRESSIVE LAMINECTOMY THORACIC NINE-THORACIC TEN AND THORACIC TEN-THORACIC ELEVEN, EXTENSION OF THORACOLUMBAR FUSION FROM THORACIC TEN TO THORACIC FIVE, LOCAL BONE GRAFT, ALLOGRAFT AND VIVIGEN;  Surgeon: Jessy Oto, MD;  Location: [place];  Service: Orthopedics;  Laterality: N/A;  DECOMPRESSIVE LAMINECTOMY THORACIC NINE-THORACIC TEN  AND THORACIC TEN-THORACIC ELEVEN, EXTENSION OF TH   SPINAL FUSION N/A 03/12/2019   Procedure: REVISION OF HARDWARE THORACIC TEN-THORACIC ELEVEN WITH APPLICATION OF ADDITIONAL RODS AND ROD SLEEVES THORACIC EIGHT-THORACIC TWELVE 12-LUMBAR ONE;  Surgeon: Jessy Oto, MD;  Location: [place];  Service: Orthopedics;  Laterality: N/A;   TONSILLECTOMY     TONSILLECTOMY     as child   torn rotator cuff  2010   right     Family History  Problem Relation Age of Onset   Hypertension Mother    Diabetes Mother        type 2   Cancer Mother 26       breast in remission   Emphysema Father        smoker   COPD Father        smoker   Stroke Father 63       mini   Heart disease Father    Hypertension Sister    Hyperlipidemia Sister    Scoliosis Sister    Osteoporosis Sister    Hypertension Maternal Grandmother    Scoliosis Maternal Grandmother    Heart disease Maternal Grandfather    Heart disease Paternal Grandfather        smoker   Heart disease Daughter      Social History   Tobacco Use   Smoking status:  Former Smoker    Packs/day: 1.00    Years: 20.00    Pack years: 20.00    Types: Cigarettes    Quit date: 07/18/1989    Years since quitting: 29.8   Smokeless tobacco: Never Used   Tobacco comment: off and on smoking for the 20 yrs  Substance Use Topics   Alcohol use: Yes    Comment: drink or two a day- mixed drink   Drug use: No      reports being sexually active.   Outpatient Medications Prior to Visit  Medication Sig Dispense Refill   aspirin 81 MG tablet Take 81 mg by mouth daily.     atorvastatin (LIPITOR) 20 MG tablet TAKE 1/2 TABLET BY MOUTH DAILY AT 6 PM 45 tablet 0   baclofen (LIORESAL) 10 MG tablet TAKE 1 TABLET BY MOUTH THREE TIMES A DAY 30 tablet 0   busPIRone (BUSPAR) 7.5 MG tablet 1-2 TAB BY MOUTH 3 TIMES A DAY FOR ANXIETY 540 tablet 1   cefTRIAXone (ROCEPHIN) 1 g injection Inject 2 g into the muscle daily. 1120 mL 0   cetirizine (ZYRTEC) 10 MG tablet Take 10 mg by mouth daily. In the morning     diazepam (VALIUM) 10 MG tablet TAKE 1 TABLET(10 MG) BY MOUTH AT BEDTIME AS NEEDED 30 tablet 1   famotidine (PEPCID) 40 MG tablet Take 1 tablet (40 mg total) by mouth at bedtime. 90 tablet 0   ferrous fumarate (HEMOCYTE - 106 MG FE) 325 (106 Fe) MG TABS tablet Take 1 tablet (106 mg of iron total) by mouth daily. 30 tablet 2   gabapentin (NEURONTIN) 300 MG capsule Take 1 capsule (300 mg total) by mouth 3 (three) times daily. 270 capsule 3   glucose blood (CONTOUR NEXT TEST) test strip Check blood sugar once daily 100 each 12   lisinopril (ZESTRIL) 5 MG tablet Take 1 tablet (5 mg total) by mouth 2 (two) times daily. 180 tablet 1   Melatonin 10 MG TABS Take 10 mg by mouth at bedtime as needed (sleep).      metFORMIN (GLUCOPHAGE) 500 MG tablet Take 1  tablet (500 mg total) by mouth 2 (two) times daily with a meal. 90 tablet 3   sildenafil (REVATIO) 20 MG tablet TAKE ONE TO THREE TABLETS BY MOUTH DAILY AS NEEDED 50 tablet 1   tamsulosin (FLOMAX) 0.4 MG CAPS capsule  TAKE TWO CAPSULES BY MOUTH DAILY  (Patient taking differently: Take 0.8 mg by mouth at bedtime. ) 180 capsule 0   testosterone cypionate (DEPOTESTOSTERONE CYPIONATE) 200 MG/ML injection Inject 1 mL (200 mg total) into the muscle every 14 (fourteen) days. 1 mL 5   tiZANidine (ZANAFLEX) 4 MG tablet Take 1 tablet (4 mg total) by mouth every 6 (six) hours as needed for muscle spasms. 30 tablet 0   venlafaxine XR (EFFEXOR-XR) 150 MG 24 hr capsule TAKE ONE CAPSULE BY MOUTH ONE TIME DAILY WITH BREAKFAST 90 capsule 0   oxyCODONE-acetaminophen (PERCOCET/ROXICET) 5-325 MG tablet Take 1-2 tablets by mouth every 8 (eight) hours as needed for up to 7 days for severe pain. 40 tablet 0   doxycycline (ADOXA) 100 MG tablet Take 1 tablet (100 mg total) by mouth 2 (two) times daily. (Patient not taking: Reported on 05/08/2019) 28 tablet 0   Facility-Administered Medications Prior to Visit  Medication Dose Route Frequency Provider Last Rate Last Dose   cefTRIAXone (ROCEPHIN) 2 g in dextrose 5 % 50 mL IVPB  2 g Intravenous Q24H Ezella Kell, Evern Core, MD         No Known Allergies    Review of Systems  Constitutional: Positive for fatigue. Negative for chills and fever.  HENT: Negative for congestion, hearing loss, rhinorrhea and sinus pressure.   Eyes: Negative for photophobia, pain, redness and visual disturbance.  Respiratory: Negative for apnea, cough, shortness of breath and wheezing.   Cardiovascular: Negative for chest pain and palpitations.  Gastrointestinal: Negative for abdominal pain, constipation, diarrhea, nausea and vomiting.  Endocrine: Negative for cold intolerance, heat intolerance, polydipsia and polyuria.  Genitourinary: Negative for decreased urine volume, dysuria, frequency, hematuria and testicular pain.  Musculoskeletal: Positive for back pain. Negative for myalgias and neck pain.  Skin: Negative for pallor and rash.  Allergic/Immunologic: Negative for immunocompromised state.    Neurological: Positive for numbness. Negative for dizziness, seizures, syncope, speech difficulty and light-headedness.       Numbness to distal LT leg x months-years  Hematological: Does not bruise/bleed easily.  Psychiatric/Behavioral: Negative for agitation and hallucinations. The patient is nervous/anxious.        Objective:     Vitals:   05/08/19 1034  BP: 137/68  Pulse: 90  Temp: 98.3 F (36.8 C)     Physical Exam Gen: pleasant, anxious, mild distress secondary to back pain, A&Ox 3 Head: NCAT, no temporal wasting evident EENT: PERRL, EOMI, MMM, adequate dentition Neck: supple, no JVD CV: NRRR, no murmurs evident Pulm: CTA bilaterally, no wheeze or retractions Abd: soft, NTND, +BS Extrems: trace LE edema, 1+ pulses Skin: no rashes, adequate skin turgor Neuro: CN II-XII grossly intact, no focal neurologic deficits appreciated, +TLSO brace intact, gait was slowed and wide stanced, A&Ox 3  Labs: Lab Results  Component Value Date   WBC 8.9 05/05/2019   HGB 9.8 (L) 05/05/2019   HCT 33.4 (L) 05/05/2019   MCV 86.3 05/05/2019   PLT 403 (H) 05/05/2019   Lab Results  Component Value Date   NA 130 (L) 05/05/2019   K 3.9 05/05/2019   CL 95 (L) 05/05/2019   CO2 22 05/05/2019   GLUCOSE 208 (H) 05/05/2019   BUN 16 05/05/2019  CREATININE 0.68 05/05/2019   CALCIUM 8.6 (L) 05/05/2019   MG 1.9 01/23/2019   PHOS 2.3 12/12/2013   Lab Results  Component Value Date   CRP 2.1 (H) 05/05/2019       Assessment & Plan:  Patient is a 73 year old white male diabetic with complex spinal surgical history requiring multiple revisions and extension of a thoracolumbar fusion and now 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  1.  Thoracic vertebral osteomyelitis/prosthetic joint infection -review of the patient's labs for the past several years shows that he has maintained a normal white blood cell count but has not had any associated CRP.  Like Dr.  Louanne Skye, I have significant concern that the patient's hardware loosening on multiple times over the last several years may have actually been due to indolent spinal infection all along.  This may explain the early migration of his Harrington rods despite no presence of overlying surgical site erythema or drainage prior to his most recent I&D and repetitive hardware fixation.  The pathogen isolated of Propionibacterium acnes is the third leading cause of periprosthetic joint infections and often will present with a subacute course.  It would be extremely unlikely that this pathogen would cause immediate loss of Harrington rods without pre-existing infection.  On April 05, 2019, he had his PICC line placed and began Rocephin 2 g IV daily for treatment of his vertebral osteomyelitis/prosthetic joint infection.  Serologically, he has had a tremendous response to treatment with improvement in his CRP from 57.7 pretreatment now down to 2.1 on May 05, 2019.  The tentative end date for his IV induction antibiotics would be May 31, 2019.  As it is anticipated that the patient will require retention of his extensive spinal hardware, I would plan for a minimum of 6 months of oral consolidative therapy following IV induction.  I shared with the patient and his wife that this strategy does still have a risk of failure given the concern for extensive infection along his fusion apparatus and the development of biofilm may preclude eradication of his infection.  I emphasized the importance of the patient lower his alcohol intake to boost his immune response and also develop healthy strategies in dealing with his chronic back pain aside from self-medication which would place him at risk for further falls and injury.  He expressed understanding and agreed to seek out services with alcoholics anonymous and secure a sponsor if needed.  2. Hyponatremia -I am concerned that this may be reflective of the patient's recent  increase in his alcohol intake.  I had an extensive discussion with the patient and his wife regarding the risks of hyponatremia, including that he may develop altered mental status or seizures at home if this issue fails to improve.  I am concerned that he is minimizing his alcohol intake, particularly as his daily Rocephin is given a saline solution and typically will elevate his sodium levels.  In this manner this may be hiding a bigger problem for the present time.  As noted above, I encouraged the patient to attend alcoholics anonymous meetings and secure a sponsor to deal more appropriately with his alcohol issues which would be the most likely cause of his recent hyponatremia.  We will continue to check BMPs weekly while he remains on IV antibiotics and follow his sodium levels moving forward.  I have discouraged the patient from drinking plain water as this also will contribute to driving his sodium levels down as well.  3.  Diabetes -  His glycemic control remains an issue for this patient as his most recent blood sugar returned at 208.  Over the past 2 weeks he has had a steady rise in his blood sugars indicating poor control of his diabetes.  I have instructed the patient to aim for a glycemic control of 150 or less as best possible to optimize his infectious outcome.

## 2019-05-09 ENCOUNTER — Other Ambulatory Visit: Payer: Self-pay | Admitting: Radiology

## 2019-05-09 ENCOUNTER — Encounter: Payer: Self-pay | Admitting: Specialist

## 2019-05-09 DIAGNOSIS — F1010 Alcohol abuse, uncomplicated: Secondary | ICD-10-CM | POA: Insufficient documentation

## 2019-05-09 MED ORDER — OXYCODONE-ACETAMINOPHEN 5-325 MG PO TABS: 1.0000 | ORAL_TABLET | Freq: Three times a day (TID) | ORAL | 0 refills | Status: DC | PRN

## 2019-05-12 ENCOUNTER — Other Ambulatory Visit (HOSPITAL_COMMUNITY)
Admission: RE | Admit: 2019-05-12 | Discharge: 2019-05-12 | Disposition: A | Discharge status: Home / Self Care | Payer: Medicare Other | Source: Other Acute Inpatient Hospital | Attending: Infectious Diseases | Admitting: Infectious Diseases

## 2019-05-12 DIAGNOSIS — T8140XA Infection following a procedure, unspecified, initial encounter: Secondary | ICD-10-CM | POA: Insufficient documentation

## 2019-05-12 DIAGNOSIS — Z981 Arthrodesis status: Secondary | ICD-10-CM | POA: Insufficient documentation

## 2019-05-12 DIAGNOSIS — M4325 Fusion of spine, thoracolumbar region: Secondary | ICD-10-CM | POA: Diagnosis not present

## 2019-05-12 DIAGNOSIS — M4635 Infection of intervertebral disc (pyogenic), thoracolumbar region: Secondary | ICD-10-CM | POA: Diagnosis not present

## 2019-05-12 LAB — BASIC METABOLIC PANEL
Anion gap: 9 (ref 5–15)
BUN: 19 mg/dL (ref 8–23)
CO2: 25 mmol/L (ref 22–32)
Calcium: 8.8 mg/dL — ABNORMAL LOW (ref 8.9–10.3)
Chloride: 101 mmol/L (ref 98–111)
Creatinine, Ser: 0.68 mg/dL (ref 0.61–1.24)
GFR calc Af Amer: >60 mL/min (ref >60)
GFR calc non Af Amer: >60 mL/min (ref >60)
Glucose, Bld: 189 mg/dL — ABNORMAL HIGH (ref 70–99)
Potassium: 4.3 mmol/L (ref 3.5–5.1)
Sodium: 135 mmol/L (ref 135–145)

## 2019-05-12 LAB — CBC WITH DIFFERENTIAL/PLATELET
Abs Immature Granulocytes: 0.02 10*3/uL (ref 0.00–0.07)
Basophils Absolute: 0 10*3/uL (ref 0.0–0.1)
Basophils Relative: 0 %
Eosinophils Absolute: 0.2 10*3/uL (ref 0.0–0.5)
Eosinophils Relative: 3 %
HCT: 38 % — ABNORMAL LOW (ref 39.0–52.0)
Hemoglobin: 11.2 g/dL — ABNORMAL LOW (ref 13.0–17.0)
Immature Granulocytes: 0 %
Lymphocytes Relative: 12 %
Lymphs Abs: 0.8 10*3/uL (ref 0.7–4.0)
MCH: 25.9 pg — ABNORMAL LOW (ref 26.0–34.0)
MCHC: 29.5 g/dL — ABNORMAL LOW (ref 30.0–36.0)
MCV: 88 fL (ref 80.0–100.0)
Monocytes Absolute: 0.8 10*3/uL (ref 0.1–1.0)
Monocytes Relative: 11 %
Neutro Abs: 5.2 10*3/uL (ref 1.7–7.7)
Neutrophils Relative %: 74 %
Platelets: 364 10*3/uL (ref 150–400)
RBC: 4.32 MIL/uL (ref 4.22–5.81)
RDW: 18.5 % — ABNORMAL HIGH (ref 11.5–15.5)
WBC: 7.1 10*3/uL (ref 4.0–10.5)
nRBC: 0 % (ref 0.0–0.2)

## 2019-05-12 LAB — C-REACTIVE PROTEIN: CRP: 1.1 mg/dL — ABNORMAL HIGH (ref <1.0)

## 2019-05-13 ENCOUNTER — Other Ambulatory Visit: Payer: Self-pay | Admitting: Specialist

## 2019-05-13 ENCOUNTER — Other Ambulatory Visit: Payer: Self-pay | Admitting: Medical

## 2019-05-16 ENCOUNTER — Ambulatory Visit (INDEPENDENT_AMBULATORY_CARE_PROVIDER_SITE_OTHER): Payer: Medicare Other | Admitting: *Deleted

## 2019-05-16 ENCOUNTER — Other Ambulatory Visit: Payer: Self-pay

## 2019-05-16 ENCOUNTER — Encounter: Payer: Self-pay | Admitting: *Deleted

## 2019-05-16 ENCOUNTER — Telehealth: Payer: Self-pay | Admitting: *Deleted

## 2019-05-16 ENCOUNTER — Encounter: Payer: Self-pay | Admitting: Specialist

## 2019-05-16 DIAGNOSIS — R7989 Other specified abnormal findings of blood chemistry: Secondary | ICD-10-CM

## 2019-05-16 DIAGNOSIS — D649 Anemia, unspecified: Secondary | ICD-10-CM

## 2019-05-16 MED ORDER — TESTOSTERONE CYPIONATE 200 MG/ML IM SOLN
200.0000 mg | Freq: Once | INTRAMUSCULAR | Status: AC
  Administered 2019-05-16: 10:00:00 200 mg via INTRAMUSCULAR

## 2019-05-16 NOTE — Progress Notes (Addendum)
Patient here  testosterone injection  Injections given and patient tolerated well.  He would like labs checked for testosterone level.  Last checked in August of last year.  Appointment scheduled for 2 weeks as well as lab appointment.  Agree with administration of testosterone for low T.   Mackie Pai, PA-C

## 2019-05-16 NOTE — Telephone Encounter (Signed)
Patient was here for nurse visit for testosterone injection. I noticed that it has been over a year since it has been checked.   Patient is agreeable on getting it checked again.    Appointment made for 05/30/19 for labs.  He will have injection right afterwards.

## 2019-05-18 ENCOUNTER — Encounter (HOSPITAL_COMMUNITY)
Admission: RE | Admit: 2019-05-18 | Discharge: 2019-05-18 | Disposition: A | Discharge status: Home / Self Care | Payer: Medicare Other | Source: Skilled Nursing Facility | Attending: Infectious Diseases | Admitting: Infectious Diseases

## 2019-05-18 DIAGNOSIS — Z981 Arthrodesis status: Secondary | ICD-10-CM | POA: Diagnosis not present

## 2019-05-18 DIAGNOSIS — T8140XA Infection following a procedure, unspecified, initial encounter: Secondary | ICD-10-CM | POA: Diagnosis not present

## 2019-05-18 DIAGNOSIS — M4325 Fusion of spine, thoracolumbar region: Secondary | ICD-10-CM | POA: Diagnosis not present

## 2019-05-18 DIAGNOSIS — M4635 Infection of intervertebral disc (pyogenic), thoracolumbar region: Secondary | ICD-10-CM | POA: Diagnosis not present

## 2019-05-18 LAB — CBC WITH DIFFERENTIAL/PLATELET
Abs Immature Granulocytes: 0.02 10*3/uL (ref 0.00–0.07)
Basophils Absolute: 0 10*3/uL (ref 0.0–0.1)
Basophils Relative: 0 %
Eosinophils Absolute: 0.1 10*3/uL (ref 0.0–0.5)
Eosinophils Relative: 2 %
HCT: 40.2 % (ref 39.0–52.0)
Hemoglobin: 12 g/dL — ABNORMAL LOW (ref 13.0–17.0)
Immature Granulocytes: 0 %
Lymphocytes Relative: 6 %
Lymphs Abs: 0.5 10*3/uL — ABNORMAL LOW (ref 0.7–4.0)
MCH: 26.2 pg (ref 26.0–34.0)
MCHC: 29.9 g/dL — ABNORMAL LOW (ref 30.0–36.0)
MCV: 87.8 fL (ref 80.0–100.0)
Monocytes Absolute: 0.7 10*3/uL (ref 0.1–1.0)
Monocytes Relative: 10 %
Neutro Abs: 6.2 10*3/uL (ref 1.7–7.7)
Neutrophils Relative %: 82 %
Platelets: 345 10*3/uL (ref 150–400)
RBC: 4.58 MIL/uL (ref 4.22–5.81)
RDW: 19.6 % — ABNORMAL HIGH (ref 11.5–15.5)
WBC: 7.6 10*3/uL (ref 4.0–10.5)
nRBC: 0 % (ref 0.0–0.2)

## 2019-05-18 LAB — BASIC METABOLIC PANEL
Anion gap: 10 (ref 5–15)
BUN: 16 mg/dL (ref 8–23)
CO2: 24 mmol/L (ref 22–32)
Calcium: 8.8 mg/dL — ABNORMAL LOW (ref 8.9–10.3)
Chloride: 100 mmol/L (ref 98–111)
Creatinine, Ser: 0.74 mg/dL (ref 0.61–1.24)
GFR calc Af Amer: >60 mL/min (ref >60)
GFR calc non Af Amer: >60 mL/min (ref >60)
Glucose, Bld: 239 mg/dL — ABNORMAL HIGH (ref 70–99)
Potassium: 4.5 mmol/L (ref 3.5–5.1)
Sodium: 134 mmol/L — ABNORMAL LOW (ref 135–145)

## 2019-05-18 LAB — C-REACTIVE PROTEIN: CRP: 1.9 mg/dL — ABNORMAL HIGH (ref <1.0)

## 2019-05-20 ENCOUNTER — Other Ambulatory Visit (INDEPENDENT_AMBULATORY_CARE_PROVIDER_SITE_OTHER): Payer: Medicare Other

## 2019-05-20 ENCOUNTER — Encounter: Payer: Self-pay | Admitting: Specialist

## 2019-05-20 DIAGNOSIS — D649 Anemia, unspecified: Secondary | ICD-10-CM | POA: Diagnosis not present

## 2019-05-20 LAB — FECAL OCCULT BLOOD, IMMUNOCHEMICAL: Fecal Occult Bld: NEGATIVE

## 2019-05-20 NOTE — Telephone Encounter (Signed)
Will put in future testosterone, cbc and iron level.

## 2019-05-21 ENCOUNTER — Other Ambulatory Visit: Payer: Self-pay | Admitting: Radiology

## 2019-05-21 ENCOUNTER — Other Ambulatory Visit: Payer: Self-pay | Admitting: Medical

## 2019-05-23 ENCOUNTER — Other Ambulatory Visit: Payer: Self-pay | Admitting: Orthopaedic Surgery

## 2019-05-23 ENCOUNTER — Encounter: Payer: Self-pay | Admitting: Specialist

## 2019-05-23 MED ORDER — OXYCODONE-ACETAMINOPHEN 5-325 MG PO TABS: 1.0000 | ORAL_TABLET | Freq: Three times a day (TID) | ORAL | 0 refills | Status: DC | PRN

## 2019-05-24 ENCOUNTER — Telehealth: Payer: Self-pay

## 2019-05-24 NOTE — Telephone Encounter (Signed)
Patient called triage returning a call that he received from our office. Called patient back and confirmed follow up appointment 05/29/2019 with Dr. Prince Rome. Inquired about if he was feeling better as we had a MyChart message from his wife stating he had a head cold. Patient reports feeling better; no fever, just sinus congestion/drainage. He's taking OTC medications.   Eliany Mccarter [name], RN

## 2019-05-25 ENCOUNTER — Encounter: Payer: Self-pay | Admitting: Specialist

## 2019-05-25 ENCOUNTER — Other Ambulatory Visit (HOSPITAL_COMMUNITY)
Admission: AD | Admit: 2019-05-25 | Discharge: 2019-05-25 | Disposition: A | Discharge status: Home / Self Care | Payer: Medicare Other | Source: Other Acute Inpatient Hospital | Attending: Infectious Diseases | Admitting: Infectious Diseases

## 2019-05-25 DIAGNOSIS — X58XXXA Exposure to other specified factors, initial encounter: Secondary | ICD-10-CM | POA: Insufficient documentation

## 2019-05-25 DIAGNOSIS — M4325 Fusion of spine, thoracolumbar region: Secondary | ICD-10-CM | POA: Diagnosis not present

## 2019-05-25 DIAGNOSIS — M4635 Infection of intervertebral disc (pyogenic), thoracolumbar region: Secondary | ICD-10-CM | POA: Diagnosis not present

## 2019-05-25 DIAGNOSIS — T8140XA Infection following a procedure, unspecified, initial encounter: Secondary | ICD-10-CM | POA: Diagnosis not present

## 2019-05-25 LAB — BASIC METABOLIC PANEL
Anion gap: 9 (ref 5–15)
BUN: 18 mg/dL (ref 8–23)
CO2: 27 mmol/L (ref 22–32)
Calcium: 9 mg/dL (ref 8.9–10.3)
Chloride: 97 mmol/L — ABNORMAL LOW (ref 98–111)
Creatinine, Ser: 0.63 mg/dL (ref 0.61–1.24)
GFR calc Af Amer: >60 mL/min (ref >60)
GFR calc non Af Amer: >60 mL/min (ref >60)
Glucose, Bld: 160 mg/dL — ABNORMAL HIGH (ref 70–99)
Potassium: 4.7 mmol/L (ref 3.5–5.1)
Sodium: 133 mmol/L — ABNORMAL LOW (ref 135–145)

## 2019-05-25 LAB — CBC
HCT: 38.8 % — ABNORMAL LOW (ref 39.0–52.0)
Hemoglobin: 11.8 g/dL — ABNORMAL LOW (ref 13.0–17.0)
MCH: 26.4 pg (ref 26.0–34.0)
MCHC: 30.4 g/dL (ref 30.0–36.0)
MCV: 86.8 fL (ref 80.0–100.0)
Platelets: 376 10*3/uL (ref 150–400)
RBC: 4.47 MIL/uL (ref 4.22–5.81)
RDW: 19.3 % — ABNORMAL HIGH (ref 11.5–15.5)
WBC: 6.9 10*3/uL (ref 4.0–10.5)
nRBC: 0 % (ref 0.0–0.2)

## 2019-05-25 LAB — C-REACTIVE PROTEIN: CRP: 3.4 mg/dL — ABNORMAL HIGH (ref <1.0)

## 2019-05-27 ENCOUNTER — Other Ambulatory Visit: Payer: Self-pay | Admitting: Radiology

## 2019-05-27 MED ORDER — BACLOFEN 10 MG PO TABS: 10.0000 mg | ORAL_TABLET | Freq: Three times a day (TID) | ORAL | 4 refills | Status: DC

## 2019-05-28 ENCOUNTER — Other Ambulatory Visit: Payer: Self-pay | Admitting: Family Medicine

## 2019-05-29 ENCOUNTER — Telehealth: Payer: Self-pay

## 2019-05-29 ENCOUNTER — Encounter: Payer: Self-pay | Admitting: Infectious Diseases

## 2019-05-29 ENCOUNTER — Ambulatory Visit: Payer: Medicare Other | Admitting: Infectious Diseases

## 2019-05-29 ENCOUNTER — Other Ambulatory Visit: Payer: Self-pay

## 2019-05-29 VITALS — BP 145/73 | HR 83 | Temp 98.1°F | Wt 198.0 lb

## 2019-05-29 DIAGNOSIS — T8450XD Infection and inflammatory reaction due to unspecified internal joint prosthesis, subsequent encounter: Secondary | ICD-10-CM

## 2019-05-29 DIAGNOSIS — M4624 Osteomyelitis of vertebra, thoracic region: Secondary | ICD-10-CM | POA: Diagnosis not present

## 2019-05-29 DIAGNOSIS — M4325 Fusion of spine, thoracolumbar region: Secondary | ICD-10-CM | POA: Diagnosis not present

## 2019-05-29 DIAGNOSIS — M4635 Infection of intervertebral disc (pyogenic), thoracolumbar region: Secondary | ICD-10-CM | POA: Diagnosis not present

## 2019-05-29 MED ORDER — DOXYCYCLINE HYCLATE 100 MG PO TABS: 100.0000 mg | ORAL_TABLET | Freq: Two times a day (BID) | ORAL | 2 refills | Status: DC

## 2019-05-29 NOTE — Telephone Encounter (Signed)
Per Dr. Prince Rome called [facility] with orders to pull picc after last dose. Spoke with Jackelyn Poling who was able to take a verbal order. Last does is 11/13. Leith

## 2019-05-29 NOTE — Progress Notes (Signed)
Subjective:    [name], male    DOB: Feb 01, 1946, 73 y.o.   MRN: [id number]  HPI The patient is a 73 year old white male with a history of degenerative disc disease and multiple spinal fusions who presents today for a follow up visit for a thoracic discitis/prosthetic joint infection.  He was last seen in our clinic on May 08, 2019.  Please note that the patient's wife was again present during today's exam and assisted with much of the history obtained.  He has a rather complex surgical history which in regards to his back began in 2015 Dr. Agapito Games performed an T8-L1 fusion to assist with left leg radicular pain and numbness.  Despite multiple visits to see his surgeon at [facility] postoperatively, the patient states that his left leg numbness and direct low thoracic back pain returned within months after his surgery.  Imaging in 2018 showed that he had an L5-S1 rod breakage and loosening of the pedicle screws at that level.  He sought a second opinion with Dr. Louanne Skye late in 2018.  In November 2018, he had a replacement of the L5-S1 fusion segment and bone grafting was performed simultaneously.  Per the operative note reviewed, the broken rod was left intact at that time.  He reports several partial or near falls following this procedure, but he denies any total falls or direct trauma to his back in the past several years.  As his back pain and left leg radicular symptoms gradually returned, further imaging was performed that suggested screw loosening at the T9-T11 section of his spinal fusion.  On February 12, 2019 he returned to the OR with Dr. Louanne Skye and was found to have screw loosening at the same levels, so his fusion was extended superiorly to T5 and bone grafting was performed.  Initially, the patient had a good postoperative course, but over weeks 2 and 3 postoperatively, he began developing pain in his back at his operative site, prompting plain films to be obtained.  X-rays  showed a loss of fixation of the opposite left internal rod fastener at the site of his severe back pain and the development of acute loosening of the interval rod fasteners and loss of fixation.  On March 12, 2019, he was taken back to the operating room where it was noted that his Harrington rod had loosened and nearly entirely migrated out of position.  A culture was obtained near the pedicle screw at T10 as a small amount of soft tissue with significant bone osteomalacia was observed.  Given concern for his spinal stability, his recently placed Harrington rods were removed and revised/replaced.  Operative cultures were followed and remained negative at the time of his hospital discharge on March 14, 2019.  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  Of note, the patient admits to a fall as a child from a 20 foot height out of a tree but denies any known vertebral fracture or bony injury following this event.  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  Following my initial visit with the patient, he had a PICC line placed and was started on once daily IV Rocephin.  Since initiating IV antibiotics, his CRP has improved from 57.7 to his current level of 3.4.  His white blood cell count  has remained normal at 6900. His sodium level which had declined to 130 has now improved slightly to 133 on his most recent labs from May 25, 2019. At his last visit,  I had a rather prolonged discussion with the patient regarding his alcohol use over the years and his own admission of using it as an anesthetic to assist with his back pain.  He admits that he has been struggling to cope with his chronic back pain over the past several years.  His back pain has significantly improved since initiating IV antibiotics several weeks ago.   He presents today in his hard corset spinal brace at today's visit.  He and his wife both state that he has maintained an adequate appetite over the last several weeks and he has missed no doses of his Rocephin since initiating treatment.  He is anxious to have his PICC line removed later this week.   Past Medical History:  Diagnosis Date   Acute pharyngitis 10/21/2013   Allergy    grass and pollen   Anxiety and depression 10/25/2011   BPH (benign prostatic hyperplasia) 04/23/2012   Chicken pox as a child   DDD (degenerative disc disease)    cervical responds to steroid injections and low back required surgery   DDD (degenerative disc disease), lumbosacral    Diabetes mellitus    pre   Dyspnea    ED (erectile dysfunction) 04/23/2012   Elevated BP    Esophageal reflux 02/10/2015   Fatigue    HTN (hypertension)    Hyperglycemia    preDM    Hyperlipidemia    Insomnia    Low back pain radiating to both legs 01/16/2017   Measles as a child   Medicare annual wellness visit, subsequent 02/10/2015   Overweight(278.02)    Personal history of colonic polyps 10/27/2012   Follows with [facility]   Pneumonia    " walking"   Preventative health care    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   Wears glasses     Past Surgical History:  Procedure Laterality Date   BACK SURGERY  2012 and 1994   Dr Margret Chance, screws and cage in low back   EYE SURGERY Bilateral    2016   HARDWARE REVISION  03/12/2019   REVISION OF HARDWARE THORACIC TEN-THORACIC ELEVEN WITH APPLICATION OF ADDITIONAL RODS AND ROD SLEEVES THORACIC EIGHT-THORACIC TWELVE 12-LUMBAR ONE (N/A )   LAMINECTOMY  02/12/2019   DECOMPRESSIVE LAMINECTOMY THORACIC NINE-THORACIC TEN AND THORACIC TEN-THORACIC ELEVEN, EXTENSION OF THORACOLUMBAR FUSION FROM THORACIC TEN TO THORACIC FIVE, LOCAL BONE GRAFT, ALLOGRAFT AND VIVIGEN (N/A)   SPINAL FUSION N/A 02/12/2019   Procedure: DECOMPRESSIVE LAMINECTOMY THORACIC  NINE-THORACIC TEN AND THORACIC TEN-THORACIC ELEVEN, EXTENSION OF THORACOLUMBAR FUSION FROM THORACIC TEN TO THORACIC FIVE, LOCAL BONE GRAFT, ALLOGRAFT AND VIVIGEN;  Surgeon: Jessy Oto, MD;  Location: [place];  Service: Orthopedics;  Laterality: N/A;  DECOMPRESSIVE LAMINECTOMY THORACIC NINE-THORACIC TEN AND THORACIC TEN-THORACIC ELEVEN, EXTENSION OF TH   SPINAL FUSION N/A 03/12/2019   Procedure: REVISION OF HARDWARE THORACIC TEN-THORACIC ELEVEN WITH APPLICATION OF ADDITIONAL RODS AND ROD SLEEVES THORACIC EIGHT-THORACIC TWELVE 12-LUMBAR ONE;  Surgeon: Jessy Oto, MD;  Location: [place];  Service: Orthopedics;  Laterality: N/A;   TONSILLECTOMY     TONSILLECTOMY     as child   torn rotator cuff  2010   right     Family History  Problem Relation Age of Onset   Hypertension Mother    Diabetes Mother  type 2   Cancer Mother 84       breast in remission   Emphysema Father        smoker   COPD Father        smoker   Stroke Father 50       mini   Heart disease Father    Hypertension Sister    Hyperlipidemia Sister    Scoliosis Sister    Osteoporosis Sister    Hypertension Maternal Grandmother    Scoliosis Maternal Grandmother    Heart disease Maternal Grandfather    Heart disease Paternal Grandfather        smoker   Heart disease Daughter      Social History   Tobacco Use   Smoking status: Former Smoker    Packs/day: 1.00    Years: 20.00    Pack years: 20.00    Types: Cigarettes    Quit date: 07/18/1989    Years since quitting: 29.8   Smokeless tobacco: Never Used   Tobacco comment: off and on smoking for the 20 yrs  Substance Use Topics   Alcohol use: Yes    Comment: drink or two a day- mixed drink   Drug use: No      reports being sexually active.   Outpatient Medications Prior to Visit  Medication Sig Dispense Refill   atorvastatin (LIPITOR) 20 MG tablet TAKE 1/2 TABLET BY MOUTH DAILY AT 6 PM 45 tablet 0   baclofen (LIORESAL) 10 MG  tablet Take 1 tablet (10 mg total) by mouth 3 (three) times daily. 90 tablet 4   busPIRone (BUSPAR) 7.5 MG tablet 1-2 TAB BY MOUTH 3 TIMES A DAY FOR ANXIETY 540 tablet 1   cefTRIAXone (ROCEPHIN) 1 g injection Inject 2 g into the muscle daily. 1120 mL 0   cetirizine (ZYRTEC) 10 MG tablet Take 10 mg by mouth daily. In the morning     diazepam (VALIUM) 10 MG tablet TAKE 1 TABLET(10 MG) BY MOUTH AT BEDTIME AS NEEDED 30 tablet 1   ferrous fumarate (HEMOCYTE - 106 MG FE) 325 (106 Fe) MG TABS tablet Take 1 tablet (106 mg of iron total) by mouth daily. 30 tablet 2   gabapentin (NEURONTIN) 300 MG capsule Take 1 capsule (300 mg total) by mouth 3 (three) times daily. 270 capsule 3   glucose blood (CONTOUR NEXT TEST) test strip Check blood sugar once daily 100 each 12   lisinopril (ZESTRIL) 5 MG tablet TAKE 1 TABLET BY MOUTH TWICE A DAY 180 tablet 1   Melatonin 10 MG TABS Take 10 mg by mouth at bedtime as needed (sleep).      metFORMIN (GLUCOPHAGE) 500 MG tablet Take 1 tablet (500 mg total) by mouth 2 (two) times daily with a meal. 90 tablet 3   oxyCODONE-acetaminophen (PERCOCET/ROXICET) 5-325 MG tablet Take 1-2 tablets by mouth every 8 (eight) hours as needed for up to 7 days for severe pain. 40 tablet 0   sildenafil (REVATIO) 20 MG tablet TAKE ONE TO THREE TABLETS BY MOUTH DAILY AS NEEDED 50 tablet 1   tamsulosin (FLOMAX) 0.4 MG CAPS capsule TAKE TWO CAPSULES BY MOUTH DAILY  180 capsule 0   testosterone cypionate (DEPOTESTOSTERONE CYPIONATE) 200 MG/ML injection Inject 1 mL (200 mg total) into the muscle every 14 (fourteen) days. 1 mL 5   tiZANidine (ZANAFLEX) 4 MG tablet Take 1 tablet (4 mg total) by mouth every 6 (six) hours as needed for muscle spasms. 30 tablet 0   venlafaxine  XR (EFFEXOR-XR) 150 MG 24 hr capsule TAKE ONE CAPSULE BY MOUTH ONE TIME DAILY WITH BREAKFAST 90 capsule 0   aspirin 81 MG tablet Take 81 mg by mouth daily.     famotidine (PEPCID) 40 MG tablet TAKE 1 TABLET BY  MOUTH EVERYDAY AT BEDTIME (Patient not taking: Reported on 05/29/2019) 90 tablet 0   Facility-Administered Medications Prior to Visit  Medication Dose Route Frequency Provider Last Rate Last Dose   cefTRIAXone (ROCEPHIN) 2 g in dextrose 5 % 50 mL IVPB  2 g Intravenous Q24H Trachelle Low, Evern Core, MD         No Known Allergies    Review of Systems  Constitutional: Positive for fatigue. Negative for chills and fever.  HENT: Negative for congestion, hearing loss, rhinorrhea and sinus pressure.   Eyes: Negative for photophobia, pain, redness and visual disturbance.  Respiratory: Negative for apnea, cough, shortness of breath and wheezing.   Cardiovascular: Negative for chest pain and palpitations.  Gastrointestinal: Negative for abdominal pain, constipation, diarrhea, nausea and vomiting.  Endocrine: Negative for cold intolerance, heat intolerance, polydipsia and polyuria.  Genitourinary: Negative for decreased urine volume, dysuria, frequency, hematuria and testicular pain.  Musculoskeletal: Positive for back pain. Negative for myalgias and neck pain.  Skin: Negative for pallor and rash.  Allergic/Immunologic: Negative for immunocompromised state.  Neurological: Positive for numbness. Negative for dizziness, seizures, syncope, speech difficulty and light-headedness.       Numbness to distal LT leg x months-years  Hematological: Does not bruise/bleed easily.  Psychiatric/Behavioral: Negative for agitation and hallucinations. The patient is nervous/anxious.        Objective:     Vitals:   05/29/19 1044  BP: (!) 145/73  Pulse: 83  Temp: 98.1 F (36.7 C)    Physical Exam Gen: pleasant, NAD, A&Ox 3 Head: NCAT, no temporal wasting evident EENT: PERRL, EOMI, MMM, adequate dentition Neck: supple, no JVD CV: NRRR, no murmurs evident Pulm: CTA bilaterally, no wheeze or retractions Abd: soft, NTND, +BS Extrems: no LE edema, 2+ pulses Skin: no rashes, adequate skin turgor Neuro: CN II-XII  grossly intact, no focal neurologic deficits appreciated, gait was wide stanced and slowed, +TLSO brace intact, A&Ox 3  Labs: Lab Results  Component Value Date   WBC 6.9 05/25/2019   HGB 11.8 (L) 05/25/2019   HCT 38.8 (L) 05/25/2019   MCV 86.8 05/25/2019   PLT 376 05/25/2019   Lab Results  Component Value Date   NA 133 (L) 05/25/2019   K 4.7 05/25/2019   CL 97 (L) 05/25/2019   CO2 27 05/25/2019   GLUCOSE 160 (H) 05/25/2019   BUN 18 05/25/2019   CREATININE 0.63 05/25/2019   CALCIUM 9.0 05/25/2019   MG 1.9 01/23/2019   PHOS 2.3 12/12/2013   Lab Results  Component Value Date   CRP 3.4 (H) 05/25/2019       Assessment & Plan:  Patient is a 73 year old white male diabetic with complex spinal surgical history requiring multiple revisions and extension of a thoracolumbar fusion and now 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  1.  Thoracic vertebral osteomyelitis/prosthetic joint infection - Like Dr. Louanne Skye, I have significant concern that the patient's hardware loosening on multiple times over the last several years may have actually been due to indolent spinal infection all along.  This may explain the early migration of his Harrington rods despite no presence of overlying surgical site erythema or drainage prior to his most recent I&D and repetitive hardware fixation.  The  pathogen isolated of Propionibacterium acnes is the third leading cause of periprosthetic joint infections and often will present with a subacute course.  It would be extremely unlikely that this pathogen would cause immediate loss of Harrington rods without pre-existing infection.  On April 05, 2019, he had his PICC line placed and began Rocephin 2 g IV daily for treatment of his vertebral osteomyelitis/prosthetic joint infection.  Serologically, he has had a tremendous response to treatment with improvement in his CRP from 57.7 pretreatment now down to 3.4 on May 25, 2019.  While  he has had a slight increase in his CRP in recent weeks, this may be actually due to increased activity and decreased alcohol consumption.  He will likely always have a low baseline of inflammation due to the significant and prolonged nature of his infection and spinal arthropathy.  He will complete his IV induction course of antibiotics of 8 weeks on May 31, 2019.  I have instructed the patient to take his Rocephin dose at 8 AM that morning so that his PICC line may be removed shortly thereafter.  As it is anticipated that the patient will require retention of his extensive spinal hardware, I would plan for a minimum of 6 months of oral consolidative therapy with doxycycline 100 mg p.o. twice daily following IV induction.  I shared with the patient and his wife that this strategy does still have a risk of failure given the concern for extensive infection along his fusion apparatus and the development of biofilm may preclude eradication of his infection.  I emphasized the importance of the patient lower his alcohol intake to boost his immune response and also develop healthy strategies in dealing with his chronic back pain aside from self-medication which would place him at risk for further falls and injury.  He expressed understanding and agreed to seek out services with alcoholics anonymous and secure a sponsor if needed.  He has not followed through on this thus far, however.  We will follow up with the patient in 2 months time for reassessment.  2. Hyponatremia -following my last visit with the patient, his sodium levels had improved.  I am now again seeing a downward trend with his sodium declining to 133, suggested that the patient has increased his alcohol intake yet again.  At his last visit, I had an extensive discussion with the patient and his wife regarding the risks of hyponatremia, including that he may develop altered mental status or seizures at home if this issue fails to improve.  I am  concerned that he is minimizing his alcohol intake, particularly as his daily Rocephin is given a saline solution and typically will elevate his sodium levels.  As noted above, I encouraged the patient to attend alcoholics anonymous meetings and secure a sponsor to deal more appropriately with his alcohol issues which would be the most likely cause of his recent hyponatremia.  We will continue to check BMPs weekly while he remains on IV antibiotics and follow his sodium levels moving forward.  I have discouraged the patient from drinking plain water as this also will contribute to driving his sodium levels down as well.  3.  Diabetes - His glycemic control remains an issue for this patient as his most recent blood sugar returned at 160.  Over the prior 4 weeks he has had a persistent elevation in his blood sugars indicating poor control of his diabetes.  I have instructed the patient to aim for a glycemic control of  150 or less as best possible to optimize his infectious outcome.

## 2019-05-29 NOTE — Patient Instructions (Signed)
Start doxycycline on Saturday November 14th. Return to clinic in 2 months.

## 2019-05-30 ENCOUNTER — Other Ambulatory Visit: Payer: Medicare Other

## 2019-05-30 ENCOUNTER — Other Ambulatory Visit: Payer: Self-pay

## 2019-05-30 ENCOUNTER — Ambulatory Visit (INDEPENDENT_AMBULATORY_CARE_PROVIDER_SITE_OTHER): Payer: Medicare Other | Admitting: *Deleted

## 2019-05-30 DIAGNOSIS — R7989 Other specified abnormal findings of blood chemistry: Secondary | ICD-10-CM | POA: Diagnosis not present

## 2019-05-30 MED ORDER — TESTOSTERONE CYPIONATE 200 MG/ML IM SOLN
200.0000 mg | Freq: Once | INTRAMUSCULAR | Status: AC
  Administered 2019-05-30: 200 mg via INTRAMUSCULAR

## 2019-05-30 NOTE — Progress Notes (Addendum)
Patient here  testosterone injection  Injection given and patient tolerated well.  Appointment scheduled for 2 weeks.    Agree with administration of testosteron.  Mackie Pai, PA-C

## 2019-06-01 DIAGNOSIS — M4325 Fusion of spine, thoracolumbar region: Secondary | ICD-10-CM | POA: Diagnosis not present

## 2019-06-01 DIAGNOSIS — M4635 Infection of intervertebral disc (pyogenic), thoracolumbar region: Secondary | ICD-10-CM | POA: Diagnosis not present

## 2019-06-05 ENCOUNTER — Other Ambulatory Visit: Payer: Self-pay | Admitting: Radiology

## 2019-06-05 ENCOUNTER — Encounter: Payer: Self-pay | Admitting: Specialist

## 2019-06-05 ENCOUNTER — Other Ambulatory Visit: Payer: Self-pay | Admitting: Medical

## 2019-06-05 MED ORDER — DIAZEPAM 10 MG PO TABS: ORAL_TABLET | ORAL | 1 refills | Status: DC

## 2019-06-05 MED ORDER — OXYCODONE-ACETAMINOPHEN 5-325 MG PO TABS: 1.0000 | ORAL_TABLET | Freq: Three times a day (TID) | ORAL | 0 refills | Status: DC | PRN

## 2019-06-05 NOTE — Telephone Encounter (Signed)
Requested medication (s) are due for refill today: yes  Requested medication (s) are on the active medication list: yes  Last refill:  04/12/2019  Future visit scheduled: no  Notes to clinic:  Refill cannot be delegated   Requested Prescriptions  Pending Prescriptions Disp Refills   diazepam (VALIUM) 10 MG tablet 30 tablet 1    Sig: TAKE 1 TABLET(10 MG) BY MOUTH AT BEDTIME AS NEEDED     Not Delegated - Psychiatry:  Anxiolytics/Hypnotics Failed - 06/05/2019  9:25 AM      Failed - This refill cannot be delegated      Passed - Urine Drug Screen completed in last 360 days.      Passed - Valid encounter within last 6 months    Recent Outpatient Visits          1 month ago Anxiety and depression   [facility] at [place], [place]   1 month ago Anxiety and depression   [facility] at [place], [place]   4 months ago Leg swelling   [facility]ivist at [place], PA-C   6 months ago Constipation, unspecified constipation type   [facility] at [place], [place]   8 months ago Testosterone deficiency   [facility] at [facility], Gay Filler, MD      Future Appointments            Tomorrow Louanne Skye, Daleen Bo, MD [place]

## 2019-06-05 NOTE — Telephone Encounter (Signed)
Medication Refill - Medication: diazepam (VALIUM) 10 MG tablet  Has the patient contacted their pharmacy? No - wants to change pharmacies (Agent: If no, request that the patient contact the pharmacy for the refill.) (Agent: If yes, when and what did the pharmacy advise?)  Preferred Pharmacy (with phone number or street name):  [id number] - [place], [place] - [address] [phone] (Phone) [phone] (Fax)   Agent: Please be advised that RX refills may take up to 3 business days. We ask that you follow-up with your pharmacy.

## 2019-06-05 NOTE — Telephone Encounter (Signed)
Rx valium sent to pt pharmacy. Pt has been made aware not to use valium with narcotic. Notified of potential interaction. He has expressed understanding in the past.

## 2019-06-06 ENCOUNTER — Ambulatory Visit (INDEPENDENT_AMBULATORY_CARE_PROVIDER_SITE_OTHER): Payer: Medicare Other | Admitting: Specialist

## 2019-06-06 ENCOUNTER — Other Ambulatory Visit: Payer: Self-pay

## 2019-06-06 ENCOUNTER — Ambulatory Visit: Payer: Self-pay

## 2019-06-06 ENCOUNTER — Encounter: Payer: Self-pay | Admitting: Specialist

## 2019-06-06 VITALS — BP 129/69 | HR 72 | Ht 70.0 in | Wt 198.0 lb

## 2019-06-06 DIAGNOSIS — R200 Anesthesia of skin: Secondary | ICD-10-CM

## 2019-06-06 DIAGNOSIS — M4325 Fusion of spine, thoracolumbar region: Secondary | ICD-10-CM

## 2019-06-06 DIAGNOSIS — M4624 Osteomyelitis of vertebra, thoracic region: Secondary | ICD-10-CM

## 2019-06-06 DIAGNOSIS — Z981 Arthrodesis status: Secondary | ICD-10-CM

## 2019-06-06 DIAGNOSIS — R202 Paresthesia of skin: Secondary | ICD-10-CM

## 2019-06-06 MED ORDER — CELECOXIB 200 MG PO CAPS: 200.0000 mg | ORAL_CAPSULE | Freq: Every day | ORAL | 3 refills | Status: DC

## 2019-06-06 NOTE — Progress Notes (Signed)
Office Visit Note   Patient: Luis Brown           Date of Birth: 05/11/1946           MRN: [id number] Visit Date: 06/06/2019              Requested by: Mackie Pai, PA-C [place],  [place] [address] PCP: Mackie Pai, PA-C   Assessment & Plan: Visit Diagnoses:  1. Status post thoracic spinal fusion   2. Fusion of spine of thoracolumbar region   3. 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HCC)     Plan: Avoid frequent bending and stooping  No lifting greater than 10 lbs. May use ice or moist heat for pain. Weight loss is of benefit. Best medication for lumbar disc disease is arthritis medications like celebrex. Exercise is important to improve your indurance and does allow people to function better inspite of back pain. Wear brace 1/2 days for 2-3 weeks then discontinue. Physical Therapy for work of thoracic extension exercises.   Follow-Up Instructions: Return in about 4 weeks (around 07/04/2019).   Orders:  Orders Placed This Encounter  Procedures  . XR Lumbar Spine 2-3 Views   No orders of the defined types were placed in this encounter.     Procedures: No procedures performed   Clinical Data: No additional findings.   Subjective: Chief Complaint  Patient presents with  . Spine - Routine Post Op    73 year old male with history of thoracolumbar fusion in the past with removal of hardware and revision of L5-S1 level and this has healed. Underwent a revision of hardware and  Biopsy of loosened pedicle screws and was found to have a positive culture for propriobacter acnes. Now 4 months post revision of the harware due to loss of interrod fixation      Review of Systems   Objective: Vital Signs: BP 129/69 (BP Location: Left Arm, Patient Position: Sitting)   Pulse 72   Ht 5\' 10"  (1.778 m)   Wt 198 lb (89.8 kg)   BMI 28.41 kg/m   Physical Exam Constitutional:      Appearance: He is well-developed.  HENT:   Head: Normocephalic and atraumatic.  Eyes:     Pupils: Pupils are equal, round, and reactive to light.  Neck:     Musculoskeletal: Normal range of motion and neck supple.  Pulmonary:     Effort: Pulmonary effort is normal.     Breath sounds: Normal breath sounds.  Abdominal:     General: Bowel sounds are normal.     Palpations: Abdomen is soft.  Skin:    General: Skin is warm and dry.  Neurological:     Mental Status: He is alert and oriented to person, place, and time.  Psychiatric:        Behavior: Behavior normal.        Thought Content: Thought content normal.        Judgment: Judgment normal.     Back Exam   Tenderness  The patient is experiencing tenderness in the lumbar.  Range of Motion  Extension: abnormal  Flexion: abnormal  Lateral bend right: abnormal  Lateral bend left: abnormal  Rotation right: abnormal  Rotation left: abnormal   Muscle Strength  Right Quadriceps:  5/5  Left Quadriceps:  5/5  Right Hamstrings:  5/5  Left Hamstrings:  5/5   Tests  Straight leg raise right: negative Straight leg raise left: negative  Reflexes  Patellar:  0/4 abnormal Achilles:  0/4 abnormal Biceps: 0/4 Babinski's sign: normal   Other  Toe walk: normal Heel walk: normal Sensation: normal Gait: normal  Erythema: no back redness Scars: present      Specialty Comments:  No specialty comments available.  Imaging: Xr Lumbar Spine 2-3 Views  Result Date: 06/06/2019 AP and lateral radiographs of the thoracolumbar spine show hardware in good position and alignment, He is healing the areas of discitis and infection with autofusion of the T10-11 level.     PMFS History: Patient Active Problem List   Diagnosis Date Noted  . Alcohol abuse 05/09/2019  . Hyponatremia 05/05/2019  . 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[place]) 04/04/2019  . Fusion of spine of thoracolumbar region 03/12/2019  . Status post thoracic spinal fusion 02/12/2019  . Spinal  stenosis of lumbar region with neurogenic claudication 05/23/2017  . Thoracic spinal stenosis 04/17/2017  . Painful orthopaedic hardware ([place]) 04/17/2017  . 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 ([place]) 04/17/2017  . Low back pain radiating to both legs 01/16/2017  . Esophageal reflux 02/10/2015  . Medicare annual wellness visit, subsequent 02/10/2015  . Personal history of colonic polyps 10/27/2012  . 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  . BPH (benign prostatic hyperplasia) 04/23/2012  . ED (erectile dysfunction) 04/23/2012  . Allergic state 10/25/2011  . Anxiety and depression 10/25/2011  . Asthma   . Diabetes mellitus without complication ([place])   . Hyperlipidemia   . Overweight   . Insomnia   . Fatigue   . Preventative health care   . DDD (degenerative disc disease), lumbosacral   . Thoracic degenerative disc disease   . HTN (hypertension)    Past Medical History:  Diagnosis Date  . Acute pharyngitis 10/21/2013  . Allergy    grass and pollen  . Anxiety and depression 10/25/2011  . BPH (benign prostatic hyperplasia) 04/23/2012  . Chicken pox as a child  . DDD (degenerative disc disease)    cervical responds to steroid injections and low back required surgery  . DDD (degenerative disc disease), lumbosacral   . Diabetes mellitus    pre  . Dyspnea   . ED (erectile dysfunction) 04/23/2012  . Elevated BP   . Esophageal reflux 02/10/2015  . Fatigue   . HTN (hypertension)   . Hyperglycemia    preDM   . Hyperlipidemia   . Insomnia   . Low back pain radiating to both legs 01/16/2017  . Measles as a child  . Medicare annual wellness visit, subsequent 02/10/2015  . Overweight(278.02)   . Personal history of colonic polyps 10/27/2012   Follows with [facility]  . Pneumonia    " walking"  . Preventative health care   . 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  . Wears glasses     Family History  Problem Relation Age of Onset   . Hypertension Mother   . Diabetes Mother        type 2  . Cancer Mother 66       breast in remission  . Emphysema Father        smoker  . COPD Father        smoker  . Stroke Father 35       mini  . Heart disease Father   . Hypertension Sister   . Hyperlipidemia Sister   . Scoliosis Sister   . Osteoporosis Sister   . Hypertension Maternal Grandmother   . Scoliosis Maternal Grandmother   .  Heart disease Maternal Grandfather   . Heart disease Paternal Grandfather        smoker  . Heart disease Daughter     Past Surgical History:  Procedure Laterality Date  . BACK SURGERY  2012 and 1994   Dr Margret Chance, screws and cage in low back  . EYE SURGERY Bilateral    2016  . HARDWARE REVISION  03/12/2019   REVISION OF HARDWARE THORACIC TEN-THORACIC ELEVEN WITH APPLICATION OF ADDITIONAL RODS AND ROD SLEEVES THORACIC EIGHT-THORACIC TWELVE 12-LUMBAR ONE (N/A )  . LAMINECTOMY  02/12/2019   DECOMPRESSIVE LAMINECTOMY THORACIC NINE-THORACIC TEN AND THORACIC TEN-THORACIC ELEVEN, EXTENSION OF THORACOLUMBAR FUSION FROM THORACIC TEN TO THORACIC FIVE, LOCAL BONE GRAFT, ALLOGRAFT AND VIVIGEN (N/A)  . SPINAL FUSION N/A 02/12/2019   Procedure: DECOMPRESSIVE LAMINECTOMY THORACIC NINE-THORACIC TEN AND THORACIC TEN-THORACIC ELEVEN, EXTENSION OF THORACOLUMBAR FUSION FROM THORACIC TEN TO THORACIC FIVE, LOCAL BONE GRAFT, ALLOGRAFT AND VIVIGEN;  Surgeon: Jessy Oto, MD;  Location: [place] [facility];  Service: Orthopedics;  Laterality: N/A;  DECOMPRESSIVE LAMINECTOMY THORACIC NINE-THORACIC TEN AND THORACIC TEN-THORACIC ELEVEN, EXTENSION OF TH  . SPINAL FUSION N/A 03/12/2019   Procedure: REVISION OF HARDWARE THORACIC TEN-THORACIC ELEVEN WITH APPLICATION OF ADDITIONAL RODS AND ROD SLEEVES THORACIC EIGHT-THORACIC TWELVE 12-LUMBAR ONE;  Surgeon: Jessy Oto, MD;  Location: [facility];  Service: Orthopedics;  Laterality: N/A;  . TONSILLECTOMY    . TONSILLECTOMY     as child  . torn rotator cuff  2010   right   Social History    Occupational History  . Not on file  Tobacco Use  . Smoking status: Former Smoker    Packs/day: 1.00    Years: 20.00    Pack years: 20.00    Types: Cigarettes    Quit date: 07/18/1989    Years since quitting: 29.9  . Smokeless tobacco: Never Used  . Tobacco comment: off and on smoking for the 20 yrs  Substance and Sexual Activity  . Alcohol use: Yes    Comment: drink or two a day- mixed drink  . Drug use: No  . Sexual activity: Yes    Comment: lives with wife, still working, no dietary restrictions, continues to exercise intermittently

## 2019-06-06 NOTE — Patient Instructions (Signed)
Avoid frequent bending and stooping  No lifting greater than 10 lbs. May use ice or moist heat for pain. Weight loss is of benefit. Best medication for lumbar disc disease is arthritis medications like celebrex. Exercise is important to improve your indurance and does allow people to function better inspite of back pain. Wear brace 1/2 days for 2-3 weeks then discontinue. Physical Therapy for work of thoracic extension exercises.

## 2019-06-10 ENCOUNTER — Encounter: Payer: Self-pay | Admitting: Medical

## 2019-06-18 ENCOUNTER — Encounter: Payer: Self-pay | Admitting: Specialist

## 2019-06-18 ENCOUNTER — Ambulatory Visit (INDEPENDENT_AMBULATORY_CARE_PROVIDER_SITE_OTHER): Payer: Medicare Other | Admitting: *Deleted

## 2019-06-18 ENCOUNTER — Other Ambulatory Visit: Payer: Self-pay

## 2019-06-18 DIAGNOSIS — R7989 Other specified abnormal findings of blood chemistry: Secondary | ICD-10-CM

## 2019-06-18 MED ORDER — TESTOSTERONE CYPIONATE 200 MG/ML IM SOLN
200.0000 mg | Freq: Once | INTRAMUSCULAR | Status: AC
  Administered 2019-06-18: 200 mg via INTRAMUSCULAR

## 2019-06-18 NOTE — Progress Notes (Addendum)
Patient heretestosterone injection  Injection given and patient tolerated well.  Appointment scheduled for 2 weeks.   Agree with administration of testosterone for low T.  Mackie Pai, PA-C

## 2019-06-19 ENCOUNTER — Encounter: Payer: Self-pay | Admitting: Radiology

## 2019-06-19 ENCOUNTER — Other Ambulatory Visit: Payer: Self-pay | Admitting: Specialist

## 2019-06-19 ENCOUNTER — Other Ambulatory Visit: Payer: Self-pay | Admitting: Radiology

## 2019-06-19 ENCOUNTER — Encounter: Payer: Self-pay | Admitting: Physical Therapy

## 2019-06-19 ENCOUNTER — Ambulatory Visit: Payer: Medicare Other | Attending: Specialist | Admitting: Physical Therapy

## 2019-06-19 DIAGNOSIS — M546 Pain in thoracic spine: Secondary | ICD-10-CM | POA: Insufficient documentation

## 2019-06-19 DIAGNOSIS — G8929 Other chronic pain: Secondary | ICD-10-CM | POA: Diagnosis not present

## 2019-06-19 DIAGNOSIS — R262 Difficulty in walking, not elsewhere classified: Secondary | ICD-10-CM | POA: Diagnosis not present

## 2019-06-19 DIAGNOSIS — M545 Low back pain, unspecified: Secondary | ICD-10-CM

## 2019-06-19 DIAGNOSIS — M5415 Radiculopathy, thoracolumbar region: Secondary | ICD-10-CM | POA: Diagnosis not present

## 2019-06-19 DIAGNOSIS — M6281 Muscle weakness (generalized): Secondary | ICD-10-CM | POA: Diagnosis not present

## 2019-06-19 MED ORDER — HYDROCODONE-ACETAMINOPHEN 5-325 MG PO TABS: 1.0000 | ORAL_TABLET | Freq: Four times a day (QID) | ORAL | 0 refills | Status: DC | PRN

## 2019-06-19 NOTE — Therapy (Signed)
[facility] [place] [address]  [place], [place], [address] Phone: [phone]   Fax:  [phone]  Physical Therapy Evaluation  Patient Details  Name: Luis Brown MRN: [id number] Date of Birth: Mar 05, 1946 Referring Provider (PT): Larae Grooms   Encounter Date: 06/19/2019  PT End of Session - 06/19/19 1018    Visit Number  1    Number of Visits  16    Date for PT Re-Evaluation  08/14/19    Authorization Type  [employer] Medicare - $40 copay    Authorization - Number of Visits  --   Medicare guidelines   PT Start Time  [id number]    PT Stop Time  1122    PT Time Calculation (min)  64 min    Activity Tolerance  Patient tolerated treatment well    Behavior During Therapy  [facility] for tasks assessed/performed       Past Medical History:  Diagnosis Date  . Acute pharyngitis 10/21/2013  . Allergy    grass and pollen  . Anxiety and depression 10/25/2011  . BPH (benign prostatic hyperplasia) 04/23/2012  . Chicken pox as a child  . DDD (degenerative disc disease)    cervical responds to steroid injections and low back required surgery  . DDD (degenerative disc disease), lumbosacral   . Diabetes mellitus    pre  . Dyspnea   . ED (erectile dysfunction) 04/23/2012  . Elevated BP   . Esophageal reflux 02/10/2015  . Fatigue   . HTN (hypertension)   . Hyperglycemia    preDM   . Hyperlipidemia   . Insomnia   . Low back pain radiating to both legs 01/16/2017  . Measles as a child  . Medicare annual wellness visit, subsequent 02/10/2015  . Overweight(278.02)   . Personal history of colonic polyps 10/27/2012   Follows with [facility]  . Pneumonia    " walking"  . Preventative health care   . 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  . Wears glasses     Past Surgical History:  Procedure Laterality Date  . BACK SURGERY  2012 and 1994   Dr Margret Chance, screws and cage in low back  . EYE SURGERY Bilateral    2016  . HARDWARE REVISION   03/12/2019   REVISION OF HARDWARE THORACIC TEN-THORACIC ELEVEN WITH APPLICATION OF ADDITIONAL RODS AND ROD SLEEVES THORACIC EIGHT-THORACIC TWELVE 12-LUMBAR ONE (N/A )  . LAMINECTOMY  02/12/2019   DECOMPRESSIVE LAMINECTOMY THORACIC NINE-THORACIC TEN AND THORACIC TEN-THORACIC ELEVEN, EXTENSION OF THORACOLUMBAR FUSION FROM THORACIC TEN TO THORACIC FIVE, LOCAL BONE GRAFT, ALLOGRAFT AND VIVIGEN (N/A)  . SPINAL FUSION N/A 02/12/2019   Procedure: DECOMPRESSIVE LAMINECTOMY THORACIC NINE-THORACIC TEN AND THORACIC TEN-THORACIC ELEVEN, EXTENSION OF THORACOLUMBAR FUSION FROM THORACIC TEN TO THORACIC FIVE, LOCAL BONE GRAFT, ALLOGRAFT AND VIVIGEN;  Surgeon: Jessy Oto, MD;  Location: [facility];  Service: Orthopedics;  Laterality: N/A;  DECOMPRESSIVE LAMINECTOMY THORACIC NINE-THORACIC TEN AND THORACIC TEN-THORACIC ELEVEN, EXTENSION OF TH  . SPINAL FUSION N/A 03/12/2019   Procedure: REVISION OF HARDWARE THORACIC TEN-THORACIC ELEVEN WITH APPLICATION OF ADDITIONAL RODS AND ROD SLEEVES THORACIC EIGHT-THORACIC TWELVE 12-LUMBAR ONE;  Surgeon: Jessy Oto, MD;  Location: [facility];  Service: Orthopedics;  Laterality: N/A;  . TONSILLECTOMY    . TONSILLECTOMY     as child  . torn rotator cuff  2010   right    There were no vitals filed for this visit.   Subjective Assessment - 06/19/19 1023  Subjective  Pt reports h/o 5 back surgeries over 25 years. Initial 2 surgeries were more decompressive. 3rd surgery was initial fusion with initial hardware. Last 2 surgeries in August of this year, replacing failed hardware. Osteomyelitis infection identified on last surgery. Reports ongoing intermittent pain since latest surgeries - has been able to wean pain meds some but still using oxycodone q 10 hrs and gabapentin and muscle relaxation. Reports numbness in L leg progressing over past 30 years now encompassing L foot and calf with limited DF control. Newer onset of R leg numbness after 4th surgery only encompassing "flat part of  foot". Reports Dr. Louanne Skye wants him to see a neurologist regarding the numbness.    Pertinent History  back surgery x 5 - most recent T5-S1 thoracolumbar fusion revision on 03/12/19    Limitations  Sitting;Standing;Walking;House hold activities;Lifting    How long can you sit comfortably?  15 minutes    How long can you stand comfortably?  5 minutes    How long can you walk comfortably?  15 minutes    Patient Stated Goals  "be stronger and have more stamina and be able to do everything w/in limits that MD will allow"    Currently in Pain?  Yes    Pain Score  3    2-3/10   Pain Location  Back    Pain Orientation  Mid;Right    Pain Descriptors / Indicators  Aching   "inflaming type of ache"   Pain Type  Chronic pain    Pain Radiating Towards  into R periscapular area, wrapping around to front of R chest when at its worst    Pain Onset  More than a month ago    Pain Frequency  Intermittent    Aggravating Factors   unpredictable/uncertain    Pain Relieving Factors  wearing LSO brace, laying down on back    Effect of Pain on Daily Activities  limited walking tolerance, can't do household chores or yardwork         [facility] PT Assessment - 06/19/19 1018      Assessment   Medical Diagnosis  s/p thoracolumbar (T5-S1) fusion    Referring Provider (PT)  Larae Grooms    Onset Date/Surgical Date  03/12/19    Hand Dominance  Right    Prior Therapy  first PT following back surgeries; PT following R shoulder surgery      Precautions   Precautions  Back    Precaution Comments  10# lifting restriction, avoiding bending & twisting    Required Braces or Orthoses  Spinal Brace    Spinal Brace  Thoracolumbosacral orthotic   PRN - per MD, can be fully weaned at this point     Balance Screen   Has the patient fallen in the past 6 months  Yes    How many times?  2    Has the patient had a decrease in activity level because of a fear of falling?   No    Is the patient reluctant to leave their home  because of a fear of falling?   No      Home Environment   Living Environment  Private residence    Living Arrangements  Spouse/significant other    Type of [place] Access  Level entry    [place]  One level   2 steps to one room   [place] - 2 wheels  Prior Function   Level of Independence  Independent    Vocation  Retired    Leisure  walking - tries to walk daily ~1 mile, gardening, yardwork, splitting wood, housekeeping/cleaning      Cognition   Overall Cognitive Status  Within Functional Limits for tasks assessed    Behaviors  Restless      ROM / Strength   AROM / PROM / Strength  Strength      Strength   Overall Strength Comments  B shoulders grossly 4+/5   all testing completed in sitting   Strength Assessment Site  Shoulder;Hip;Knee;Ankle    Right/Left Shoulder  --    Right/Left Hip  Right;Left    Right Hip Flexion  4/5    Right Hip Extension  3+/5    Right Hip External Rotation   4-/5    Right Hip Internal Rotation  4+/5    Right Hip ABduction  4/5    Right Hip ADduction  4-/5    Left Hip Flexion  4/5    Left Hip Extension  3+/5    Left Hip External Rotation  4-/5    Left Hip Internal Rotation  4+/5    Left Hip ABduction  4/5    Left Hip ADduction  4-/5    Right/Left Knee  Right;Left    Right Knee Flexion  4/5    Right Knee Extension  4+/5    Left Knee Flexion  4/5    Left Knee Extension  4/5    Right/Left Ankle  Right;Left    Right Ankle Dorsiflexion  4/5    Right Ankle Plantar Flexion  2+/5    Left Ankle Dorsiflexion  4/5    Left Ankle Plantar Flexion  2+/5      Flexibility   Soft Tissue Assessment /Muscle Length  yes    Hamstrings  mod tight B    ITB  mild tight B    Piriformis  mild tight B    Obturator Internus  mod tight B                Objective measurements completed on examination: See above findings.      [facility] PT Treatment/Exercise - 06/19/19 1018      Exercises   Exercises  Lumbar       Lumbar Exercises: Stretches   Passive Hamstring Stretch  Right;Left;30 seconds;1 rep    Passive Hamstring Stretch Limitations  supine with strap      Lumbar Exercises: Supine   Ab Set  10 reps;5 seconds    Clam  10 reps;3 seconds    Clam Limitations  bent knee fall-out    Bent Knee Raise  10 reps;3 seconds    Bent Knee Raise Limitations  brace marching             PT Education - 06/19/19 1122    Education Details  PT eval findings, anticipated POC    Person(s) Educated  Patient    Methods  Explanation;Demonstration;Handout;Verbal cues;Tactile cues    Comprehension  Verbalized understanding;Returned demonstration;Verbal cues required;Tactile cues required;Need further instruction       PT Short Term Goals - 06/19/19 1122      PT SHORT TERM GOAL #1   Title  Patient will be independent with initial HEP    Status  New    Target Date  07/10/19      PT SHORT TERM GOAL #2   Title  Patient will verbalize/demonstrate understanding of neutral spine  posture and proper body mechanics to reduce strain on thoracolumbar spine    Status  New    Target Date  07/10/19        PT Long Term Goals - 06/19/19 1122      PT LONG TERM GOAL #1   Title  Patient will be independent with ongoing/advanced HEP    Status  New    Target Date  08/14/19      PT LONG TERM GOAL #2   Title  Patient to demonstrate appropriate posture and body mechanics needed for daily activities    Status  New    Target Date  08/14/19      PT LONG TERM GOAL #3   Title  B hip and knee strength grossly >/= 4+/5 for improved stability    Status  New    Target Date  08/14/19      PT LONG TERM GOAL #4   Title  Patient to report ability to perform ADLs, household and yardwork-related tasks within limits of MD restrictions without increased pain >/= 3/10    Status  New    Target Date  08/14/19             Plan - 06/19/19 1122    Clinical Impression Statement  Cordney Hanes") is a 73 y/o male referred  to OP PT for thoracic extension exercises, LE and core strengthening s/p latest thoracolumbar fusion revision on 03/12/19 with T5-S1 fusion. Patient reports a total of 5 back surgeries with osteomyelitis of the thoracic spine identified as of latest surgery. He reports he has been given clearance by MD to wean from his TLSO brace but reports he continues to rely on brace at times for pain management. Lifting is still restricted to 10 lbs and he is still to avoid bending. Pain currently only 2-3/10 localized to R thoracic spine but he reports recent dose of oxycodone prior to coming for eval today, but reports pain will extend into R periscapular area and around to front of chest when at its worst. Assessment revealing decreased proximal LE flexibility with mild to moderate LE weakness present, but good shoulder girdle strength. Numbness present in B distal LE L>R for which he is to see a neurologist. [name] will benefit from skilled PT services to address aforementioned impairments and allow for increased participation in desired activities with decreased pain interference.    Personal Factors and Comorbidities  Age;Comorbidity 3+;Past/Current Experience;Time since onset of injury/illness/exacerbation    Comorbidities  osteomyelitis; peripheral neuropathy/B foot/LE numbness; HTN; R RTC repair 2010; additional PMHx as above    Examination-Activity Limitations  Bed Mobility;Bend;Caring for Others;Carry;Lift;Locomotion Level;Reach Overhead;Sit;Squat;Stairs;Stand    Examination-Participation Restrictions  Cleaning;Driving;Laundry;Yard Work;Community Activity    Stability/Clinical Decision Making  Evolving/Moderate complexity    Clinical Decision Making  Moderate    Rehab Potential  Good    PT Frequency  2x / week    PT Duration  8 weeks    PT Treatment/Interventions  ADLs/Self Care Home Management;Cryotherapy;Electrical Stimulation;Moist Heat;Gait training;Stair training;Functional mobility training;Therapeutic  activities;Therapeutic exercise;Balance training;Neuromuscular re-education;Patient/family education;Manual techniques;Scar mobilization;Passive range of motion;Dry needling;Taping    PT Next Visit Plan  Review initial HEP; Posture and body mechanics education; Core and LE strengthening; Thoracic extension exercises; Manual therapy & modalities PRN    PT Home Exercise Plan  06/19/19 - HS stretch, abdominal bracing + brace marching & bent knee fall-outs    Consulted and Agree with Plan of Care  Patient       Patient  will benefit from skilled therapeutic intervention in order to improve the following deficits and impairments:  Abnormal gait, Decreased activity tolerance, Decreased balance, Decreased endurance, Decreased knowledge of precautions, Decreased mobility, Decreased range of motion, Decreased safety awareness, Decreased strength, Difficulty walking, Increased muscle spasms, Impaired perceived functional ability, Impaired flexibility, Impaired sensation, Improper body mechanics, Postural dysfunction, Pain  Visit Diagnosis: Pain in thoracic spine  Chronic right-sided low back pain without sciatica  Radiculopathy, thoracolumbar region  Muscle weakness (generalized)  Difficulty in walking, not elsewhere classified     Problem List Patient Active Problem List   Diagnosis Date Noted  . Alcohol abuse 05/09/2019  . Hyponatremia 05/05/2019  . 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[place]) 04/04/2019  . Fusion of spine of thoracolumbar region 03/12/2019  . Status post thoracic spinal fusion 02/12/2019  . 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 Thoracic spinal stenosis 04/17/2017  . Painful orthopaedic hardware ([place]) 04/17/2017  . 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 ([place]) 04/17/2017  . Low back pain radiating to both legs 01/16/2017  . Esophageal reflux 02/10/2015  . Medicare annual wellness  visit, subsequent 02/10/2015  . Personal history of colonic polyps 10/27/2012  . 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  . BPH (benign prostatic hyperplasia) 04/23/2012  . ED (erectile dysfunction) 04/23/2012  . Allergic state 10/25/2011  . Anxiety and depression 10/25/2011  . Asthma   . Diabetes mellitus without complication ([place])   . Hyperlipidemia   . Overweight   . Insomnia   . Fatigue   . Preventative health care   . DDD (degenerative disc disease), lumbosacral   . Thoracic degenerative disc disease   . HTN (hypertension)     Percival Spanish, PT, MPT 06/19/2019, 2:16 PM  [facility] [address]  [place], [place], [address] Phone: [phone]   Fax:  [phone]  Name: PLATON BRACHER MRN: [id number] Date of Birth: 04-Mar-1946

## 2019-06-19 NOTE — Patient Instructions (Signed)
    Home exercise program created by Alvie Speltz, PT.  For questions, please contact Shem Plemmons via phone at [phone] or email at [email or web address]  [facility] [place] [address]  [address] [place], [place], [address] Phone: [phone]   Fax:  [phone]    

## 2019-06-19 NOTE — Telephone Encounter (Signed)
Changed to hydrocodone 5 mg one every 6 hours #30. Stop oxycodone.

## 2019-06-24 ENCOUNTER — Other Ambulatory Visit: Payer: Self-pay

## 2019-06-24 ENCOUNTER — Encounter: Payer: Self-pay | Admitting: Neurology

## 2019-06-24 ENCOUNTER — Ambulatory Visit: Payer: Medicare Other | Admitting: Neurology

## 2019-06-24 ENCOUNTER — Ambulatory Visit: Payer: Medicare Other | Admitting: Physical Therapy

## 2019-06-24 VITALS — BP 99/55 | HR 71 | Temp 97.3°F | Ht 70.0 in | Wt 194.3 lb

## 2019-06-24 DIAGNOSIS — R202 Paresthesia of skin: Secondary | ICD-10-CM

## 2019-06-24 NOTE — Progress Notes (Signed)
Reason for visit: Numbness of the legs  Referring physician: Dr. Johnanna Schneiders is a 73 y.o. male  History of present illness:  [name] is a 73 year old right-handed white male with a history of borderline diabetes/diabetes and history of alcohol overuse.  The patient has noted a 20-year history of some slight numbness involving the heel of the left foot that has gradually spread over time to go up the leg to just below the knee.  He claims this significantly worsened following lumbar and lower thoracic spine surgeries, 1 in 2018 and another in August 2020.  The patient claims that after surgery in 2018 he also began having some numbness in the sole of the foot on the right, but the numbness is clearly worse on the left leg.  He reports some mild discomfort, the discomfort is worse when he is off of his feet.  The patient does have chronic low back pain that has worsened since his surgery in August 2020.  He denies any radiation of pain from his low back down the legs on either side.  He denies issues controlling the bowels or the bladder.  He denies any numbness of hands.  He does have some chronic neck pain without radiation down the arms.  He has had a problem with osteomyelitis affecting the lower thoracic area necessitating removal of hardware, he is followed through infectious disease and currently is on antibiotics.  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  The patient is sent to this office for an evaluation of the sensory changes in the legs.  The patient does report some gait instability, he reports no recent falls.  He denies issues controlling the bowels or the bladder.  He has had a longstanding problem with inability to walk on his toes.  Past Medical History:  Diagnosis Date  . Acute pharyngitis 10/21/2013  . Allergy    grass and pollen  . Anxiety and depression  10/25/2011  . BPH (benign prostatic hyperplasia) 04/23/2012  . Chicken pox as a child  . DDD (degenerative disc disease)    cervical responds to steroid injections and low back required surgery  . DDD (degenerative disc disease), lumbosacral   . Diabetes mellitus    pre  . Dyspnea   . ED (erectile dysfunction) 04/23/2012  . Elevated BP   . Esophageal reflux 02/10/2015  . Fatigue   . HTN (hypertension)   . Hyperglycemia    preDM   . Hyperlipidemia   . Insomnia   . Low back pain radiating to both legs 01/16/2017  . Measles as a child  . Medicare annual wellness visit, subsequent 02/10/2015  . Overweight(278.02)   . Personal history of colonic polyps 10/27/2012   Follows with [facility]  . Pneumonia    " walking"  . Preventative health care   . 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  . Wears glasses     Past Surgical History:  Procedure Laterality Date  . BACK SURGERY  2012 and 1994   Dr Margret Chance, screws and cage in low back  . EYE SURGERY Bilateral    2016  . HARDWARE REVISION  03/12/2019   REVISION OF HARDWARE THORACIC TEN-THORACIC ELEVEN WITH APPLICATION OF ADDITIONAL RODS AND ROD SLEEVES THORACIC EIGHT-THORACIC TWELVE 12-LUMBAR ONE (N/A )  . LAMINECTOMY  02/12/2019   DECOMPRESSIVE LAMINECTOMY THORACIC NINE-THORACIC TEN AND THORACIC TEN-THORACIC ELEVEN, EXTENSION  OF THORACOLUMBAR FUSION FROM THORACIC TEN TO THORACIC FIVE, LOCAL BONE GRAFT, ALLOGRAFT AND VIVIGEN (N/A)  . SPINAL FUSION N/A 02/12/2019   Procedure: DECOMPRESSIVE LAMINECTOMY THORACIC NINE-THORACIC TEN AND THORACIC TEN-THORACIC ELEVEN, EXTENSION OF THORACOLUMBAR FUSION FROM THORACIC TEN TO THORACIC FIVE, LOCAL BONE GRAFT, ALLOGRAFT AND VIVIGEN;  Surgeon: Jessy Oto, MD;  Location: [place];  Service: Orthopedics;  Laterality: N/A;  DECOMPRESSIVE LAMINECTOMY THORACIC NINE-THORACIC TEN AND THORACIC TEN-THORACIC ELEVEN, EXTENSION OF TH  . SPINAL FUSION N/A 03/12/2019   Procedure: REVISION OF HARDWARE THORACIC  TEN-THORACIC ELEVEN WITH APPLICATION OF ADDITIONAL RODS AND ROD SLEEVES THORACIC EIGHT-THORACIC TWELVE 12-LUMBAR ONE;  Surgeon: Jessy Oto, MD;  Location: [place];  Service: Orthopedics;  Laterality: N/A;  . TONSILLECTOMY    . TONSILLECTOMY     as child  . torn rotator cuff  2010   right    Family History  Problem Relation Age of Onset  . Hypertension Mother   . Diabetes Mother        type 2  . Cancer Mother 6       breast in remission  . Emphysema Father        smoker  . COPD Father        smoker  . Stroke Father 40       mini  . Heart disease Father   . Hypertension Sister   . Hyperlipidemia Sister   . Scoliosis Sister   . Osteoporosis Sister   . Hypertension Maternal Grandmother   . Scoliosis Maternal Grandmother   . Heart disease Maternal Grandfather   . Heart disease Paternal Grandfather        smoker  . Heart disease Daughter     Social history:  reports that he quit smoking about 29 years ago. His smoking use included cigarettes. He has a 20.00 pack-year smoking history. He has never used smokeless tobacco. He reports current alcohol use. He reports that he does not use drugs.  Medications:  Prior to Admission medications   Medication Sig Start Date End Date Taking? Authorizing Provider  aspirin 81 MG tablet Take 81 mg by mouth daily.    [provider]  atorvastatin (LIPITOR) 20 MG tablet TAKE 1/2 TABLET BY MOUTH DAILY AT 6 PM 05/02/19   Saguier, Percell Miller, PA-C  baclofen (LIORESAL) 10 MG tablet Take 1 tablet (10 mg total) by mouth 3 (three) times daily. 05/27/19   Jessy Oto, MD  busPIRone (BUSPAR) 7.5 MG tablet 1-2 TAB BY MOUTH 3 TIMES A DAY FOR ANXIETY 05/06/19   Saguier, Percell Miller, PA-C  celecoxib (CELEBREX) 200 MG capsule Take 1 capsule (200 mg total) by mouth daily. 06/06/19   Jessy Oto, MD  cetirizine (ZYRTEC) 10 MG tablet Take 10 mg by mouth daily. In the morning    [provider]  diazepam (VALIUM) 10 MG tablet TAKE 1 TABLET(10 MG)  BY MOUTH AT BEDTIME AS NEEDED 06/05/19   Saguier, Percell Miller, PA-C  doxycycline (VIBRA-TABS) 100 MG tablet Take 1 tablet (100 mg total) by mouth 2 (two) times daily. 05/29/19   Powers, Evern Core, MD  famotidine (PEPCID) 40 MG tablet TAKE 1 TABLET BY MOUTH EVERYDAY AT BEDTIME 05/14/19   Saguier, Percell Miller, PA-C  ferrous fumarate (HEMOCYTE - 106 MG FE) 325 (106 Fe) MG TABS tablet Take 1 tablet (106 mg of iron total) by mouth daily. 04/23/19   Saguier, Percell Miller, PA-C  gabapentin (NEURONTIN) 300 MG capsule Take 1 capsule (300 mg total) by mouth 3 (three) times  daily. 03/20/19   Jessy Oto, MD  glucose blood (CONTOUR NEXT TEST) test strip Check blood sugar once daily 02/25/19   Saguier, Percell Miller, PA-C  HYDROcodone-acetaminophen (NORCO/VICODIN) 5-325 MG tablet Take 1 tablet by mouth every 6 (six) hours as needed for moderate pain. 06/19/19   Jessy Oto, MD  lisinopril (ZESTRIL) 5 MG tablet TAKE 1 TABLET BY MOUTH TWICE A DAY 05/28/19   [name], MD  Melatonin 10 MG TABS Take 10 mg by mouth at bedtime as needed (sleep).     [provider]  metFORMIN (GLUCOPHAGE) 500 MG tablet Take 1 tablet (500 mg total) by mouth 2 (two) times daily with a meal. 04/22/19   [name], MD  oxyCODONE-acetaminophen (PERCOCET/ROXICET) 5-325 MG tablet Take 1-2 tablets by mouth every 8 (eight) hours as needed for up to 7 days for severe pain. 06/05/19 06/12/19  Jessy Oto, MD  sildenafil (REVATIO) 20 MG tablet TAKE ONE TO THREE TABLETS BY MOUTH DAILY AS NEEDED 12/17/18   [name], MD  tamsulosin (FLOMAX) 0.4 MG CAPS capsule TAKE TWO CAPSULES BY MOUTH DAILY  05/21/19   Saguier, Percell Miller, PA-C  testosterone cypionate (DEPOTESTOSTERONE CYPIONATE) 200 MG/ML injection Inject 1 mL (200 mg total) into the muscle every 14 (fourteen) days. 02/05/19   Saguier, Percell Miller, PA-C  tiZANidine (ZANAFLEX) 4 MG tablet Take 1 tablet (4 mg total) by mouth every 6 (six) hours as needed for muscle spasms. 02/20/19   Jessy Oto, MD   venlafaxine XR (EFFEXOR-XR) 150 MG 24 hr capsule TAKE ONE CAPSULE BY MOUTH ONE TIME DAILY WITH BREAKFAST 04/22/19   Saguier, Percell Miller, PA-C     No Known Allergies  ROS:  Out of a complete 14 system review of symptoms, the patient complains only of the following symptoms, and all other reviewed systems are negative.  Numbness Back, neck pain Balance issues  Blood pressure (!) 99/55, pulse 71, temperature (!) 97.3 F (36.3 C), temperature source Temporal, height 5\' 10"  (1.778 m), weight 194 lb 5 oz (88.1 kg).  Physical Exam  General: The patient is alert and cooperative at the time of the examination.  Eyes: Pupils are equal, round, and reactive to light. Discs are flat bilaterally.  Neck: The neck is supple, no carotid bruits are noted.  Respiratory: The respiratory examination is clear.  Cardiovascular: The cardiovascular examination reveals a regular rate and rhythm, no obvious murmurs or rubs are noted.  Skin: Extremities are without significant edema.  The patient has relatively high arches in both feet.  Neurologic Exam  Mental status: The patient is alert and oriented x 3 at the time of the examination. The patient has apparent normal recent and remote memory, with an apparently normal attention span and concentration ability.  Cranial nerves: Facial symmetry is present. There is good sensation of the face to pinprick and soft touch bilaterally. The strength of the facial muscles and the muscles to head turning and shoulder shrug are normal bilaterally. Speech is well enunciated, no aphasia or dysarthria is noted. Extraocular movements are full. Visual fields are full. The tongue is midline, and the patient has symmetric elevation of the soft palate. No obvious hearing deficits are noted.  Motor: The motor testing reveals 5 over 5 strength of all 4 extremities. Good symmetric motor tone is noted throughout.  Sensory: Sensory testing is intact to pinprick, soft touch,  vibration sensation, and position sense on the upper extremities.  With the lower extremities, there is a stocking  pattern pinprick sensory deficit up to the knees bilaterally, there is decreased vibration sensation in both feet but worse on the left.  Position sensation is normal on the right foot, decreased on the left.  No evidence of extinction is noted.  Coordination: Cerebellar testing reveals good finger-nose-finger and heel-to-shin bilaterally.  Gait and station: Gait is minimally wide-based, the patient can walk independently.  Tandem gait is unsteady.  Romberg is negative. No drift is seen.  The patient is able to walk on the heels bilaterally but has inability to walk on the toes on both sides.  Reflexes: Deep tendon reflexes are symmetric, but are depressed bilaterally. Toes are downgoing bilaterally.   MRI thoracic 12/12/18:  IMPRESSION: Status post fusion from T10 into the lumbar spine.  Pedicle screws in T10 are positioned in the T9-10 disc interspace and result in erosion and irregularity of the T9-10 endplates. There is a disc bulge and ligamentum flavum thickening at this level causing deformity of the cord. Moderate to moderately severe bilateral foraminal narrowing is also present at T9-10.  Disc bulge and ligamentum flavum thickening at T10-11 cause moderate central canal stenosis and mild deformity of the cord. Mild to moderate bilateral foraminal narrowing is seen at this level.  * MRI scan images were reviewed online. I agree with the written report.    Assessment/Plan:  1.  Lower extremity numbness, paresthesias  2.  History of lower thoracic, lumbosacral spine surgery, osteomyelitis  The patient does have a history of diabetes and alcohol overuse.  He is at risk for a peripheral neuropathy.  The sensory changes in the left leg date back 20 years, and 2 years for the right foot and leg.  There is asymmetry of the sensory changes suggesting that the problem  is at least in part related to the spine issues.  The patient will be sent for blood work today, he will have nerve conductions on both legs, and EMG on the left leg.  He will follow-up for the above study.  He is already on gabapentin.  The amount of discomfort he is having in the legs is relatively minimal.  Jill Alexanders MD 06/24/2019 8:39 AM  [facility] [address] [place], [place] [address]  Phone [phone] Fax [phone]

## 2019-06-25 ENCOUNTER — Encounter: Payer: Self-pay | Admitting: Specialist

## 2019-06-25 ENCOUNTER — Other Ambulatory Visit: Payer: Self-pay | Admitting: Radiology

## 2019-06-25 MED ORDER — HYDROCODONE-ACETAMINOPHEN 5-325 MG PO TABS: 1.0000 | ORAL_TABLET | Freq: Four times a day (QID) | ORAL | 0 refills | Status: DC | PRN

## 2019-06-27 ENCOUNTER — Other Ambulatory Visit: Payer: Self-pay

## 2019-06-27 ENCOUNTER — Ambulatory Visit: Payer: Medicare Other

## 2019-06-27 ENCOUNTER — Other Ambulatory Visit: Payer: Self-pay | Admitting: Medical

## 2019-06-27 DIAGNOSIS — M5415 Radiculopathy, thoracolumbar region: Secondary | ICD-10-CM

## 2019-06-27 DIAGNOSIS — M545 Low back pain, unspecified: Secondary | ICD-10-CM

## 2019-06-27 DIAGNOSIS — R262 Difficulty in walking, not elsewhere classified: Secondary | ICD-10-CM

## 2019-06-27 DIAGNOSIS — M546 Pain in thoracic spine: Secondary | ICD-10-CM | POA: Diagnosis not present

## 2019-06-27 DIAGNOSIS — G8929 Other chronic pain: Secondary | ICD-10-CM

## 2019-06-27 DIAGNOSIS — M6281 Muscle weakness (generalized): Secondary | ICD-10-CM

## 2019-06-27 LAB — ANGIOTENSIN CONVERTING ENZYME: Angio Convert Enzyme: 12 U/L — ABNORMAL LOW (ref 14–82)

## 2019-06-27 LAB — COPPER, SERUM: Copper: 155 ug/dL (ref 72–166)

## 2019-06-27 LAB — MULTIPLE MYELOMA PANEL, SERUM
Albumin SerPl Elph-Mcnc: 3.4 g/dL (ref 2.9–4.4)
Albumin/Glob SerPl: 1.3 (ref 0.7–1.7)
Alpha 1: 0.3 g/dL (ref 0.0–0.4)
Alpha2 Glob SerPl Elph-Mcnc: 0.9 g/dL (ref 0.4–1.0)
B-Globulin SerPl Elph-Mcnc: 0.9 g/dL (ref 0.7–1.3)
Gamma Glob SerPl Elph-Mcnc: 0.7 g/dL (ref 0.4–1.8)
Globulin, Total: 2.8 g/dL (ref 2.2–3.9)
IgA/Immunoglobulin A, Serum: 60 mg/dL — ABNORMAL LOW (ref 61–437)
IgG (Immunoglobin G), Serum: 773 mg/dL (ref 603–1613)
IgM (Immunoglobulin M), Srm: 7 mg/dL — ABNORMAL LOW (ref 15–143)
Total Protein: 6.2 g/dL (ref 6.0–8.5)

## 2019-06-27 LAB — ANA W/REFLEX: Anti Nuclear Antibody (ANA): POSITIVE — AB

## 2019-06-27 LAB — VITAMIN B12: Vitamin B-12: 549 pg/mL (ref 232–1245)

## 2019-06-27 LAB — ENA+DNA/DS+SJORGEN'S
ENA RNP Ab: 2.9 AI — ABNORMAL HIGH (ref 0.0–0.9)
ENA SM Ab Ser-aCnc: <0.2 AI (ref 0.0–0.9)
ENA SSA (RO) Ab: <0.2 AI (ref 0.0–0.9)
ENA SSB (LA) Ab: <0.2 AI (ref 0.0–0.9)
dsDNA Ab: <1 IU/mL (ref 0–9)

## 2019-06-27 LAB — B. BURGDORFI ANTIBODIES: Lyme IgG/IgM Ab: <0.91 {ISR} (ref 0.00–0.90)

## 2019-06-27 MED ORDER — TESTOSTERONE CYPIONATE 200 MG/ML IM SOLN: 200.0000 mg | INTRAMUSCULAR | 5 refills | Status: DC

## 2019-06-27 NOTE — Patient Instructions (Addendum)

## 2019-06-27 NOTE — Therapy (Addendum)
[facility] [address]  [place], [place], [address] Phone: [phone]   Fax:  [phone]  Physical Therapy Treatment  Patient Details  Name: Luis Brown MRN: [id number] Date of Birth: Jan 12, 1946 Referring Provider (PT): Larae Grooms   Encounter Date: 06/27/2019  PT End of Session - 06/27/19 1108    Visit Number  2    Number of Visits  16    Date for PT Re-Evaluation  08/14/19    Authorization Type  [employer] Medicare - $40 copay    Authorization - Number of Visits  --   Medicare guidelines   PT Start Time  [id number]    PT Stop Time  1143    PT Time Calculation (min)  40 min    Activity Tolerance  Patient tolerated treatment well    Behavior During Therapy  [facility] for tasks assessed/performed       Past Medical History:  Diagnosis Date  . Acute pharyngitis 10/21/2013  . Allergy    grass and pollen  . Anxiety and depression 10/25/2011  . BPH (benign prostatic hyperplasia) 04/23/2012  . Chicken pox as a child  . DDD (degenerative disc disease)    cervical responds to steroid injections and low back required surgery  . DDD (degenerative disc disease), lumbosacral   . Diabetes mellitus    pre  . Dyspnea   . ED (erectile dysfunction) 04/23/2012  . Elevated BP   . Esophageal reflux 02/10/2015  . Fatigue   . HTN (hypertension)   . Hyperglycemia    preDM   . Hyperlipidemia   . Insomnia   . Low back pain radiating to both legs 01/16/2017  . Measles as a child  . Medicare annual wellness visit, subsequent 02/10/2015  . Overweight(278.02)   . Personal history of colonic polyps 10/27/2012   Follows with [facility]  . Pneumonia    " walking"  . Preventative health care   . 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  . Wears glasses     Past Surgical History:  Procedure Laterality Date  . BACK SURGERY  2012 and 1994   Dr Margret Chance, screws and cage in low back  . EYE SURGERY Bilateral    2016  . HARDWARE REVISION   03/12/2019   REVISION OF HARDWARE THORACIC TEN-THORACIC ELEVEN WITH APPLICATION OF ADDITIONAL RODS AND ROD SLEEVES THORACIC EIGHT-THORACIC TWELVE 12-LUMBAR ONE (N/A )  . LAMINECTOMY  02/12/2019   DECOMPRESSIVE LAMINECTOMY THORACIC NINE-THORACIC TEN AND THORACIC TEN-THORACIC ELEVEN, EXTENSION OF THORACOLUMBAR FUSION FROM THORACIC TEN TO THORACIC FIVE, LOCAL BONE GRAFT, ALLOGRAFT AND VIVIGEN (N/A)  . SPINAL FUSION N/A 02/12/2019   Procedure: DECOMPRESSIVE LAMINECTOMY THORACIC NINE-THORACIC TEN AND THORACIC TEN-THORACIC ELEVEN, EXTENSION OF THORACOLUMBAR FUSION FROM THORACIC TEN TO THORACIC FIVE, LOCAL BONE GRAFT, ALLOGRAFT AND VIVIGEN;  Surgeon: Jessy Oto, MD;  Location: [place];  Service: Orthopedics;  Laterality: N/A;  DECOMPRESSIVE LAMINECTOMY THORACIC NINE-THORACIC TEN AND THORACIC TEN-THORACIC ELEVEN, EXTENSION OF TH  . SPINAL FUSION N/A 03/12/2019   Procedure: REVISION OF HARDWARE THORACIC TEN-THORACIC ELEVEN WITH APPLICATION OF ADDITIONAL RODS AND ROD SLEEVES THORACIC EIGHT-THORACIC TWELVE 12-LUMBAR ONE;  Surgeon: Jessy Oto, MD;  Location: [place];  Service: Orthopedics;  Laterality: N/A;  . TONSILLECTOMY    . TONSILLECTOMY     as child  . torn rotator cuff  2010   right    There were no vitals filed for this visit.  Subjective Assessment - 06/27/19 1110    Subjective  Doing well today.  Notes he has been performing HEP.    Pertinent History  back surgery x 5 - most recent T5-S1 thoracolumbar fusion revision on 03/12/19    Patient Stated Goals  "be stronger and have more stamina and be able to do everything w/in limits that MD will allow"    Currently in Pain?  Yes    Pain Score  1     Pain Location  Back    Pain Orientation  Right;Mid    Pain Descriptors / Indicators  Sore    Pain Type  Chronic pain    Pain Radiating Towards  at times to R pariscapular area, wrapping around to front of R chest when at its worst    Pain Onset  More than a month ago    Pain Frequency   Intermittent    Multiple Pain Sites  No                       [facility] Adult PT Treatment/Exercise - 06/27/19 0001      Self-Care   Self-Care  Other Self-Care Comments    Other Self-Care Comments   Reviewed proper posture and body mechanics with daily activities for reduction in lumbar strain;  focused on sitting and standing posture, sleeping posture with sidelying pillow between knees, washing dishes in sink with staggered stance      Lumbar Exercises: Stretches   Passive Hamstring Stretch  Right;Left;30 seconds;1 rep    Passive Hamstring Stretch Limitations  supine with strap    Figure 4 Stretch  1 rep;30 seconds   L    Figure 4 Stretch Limitations  seated       Lumbar Exercises: Aerobic   Nustep  Lvl 3, 6 min (LEs)      Lumbar Exercises: Supine   Ab Set  10 reps;5 seconds    Clam  10 reps;3 seconds   cues for slow fall outs   Clam Limitations  bent knee fall-out    Bent Knee Raise  10 reps;3 seconds    Bent Knee Raise Limitations  brace marching             PT Education - 06/27/19 1300    Education Details  HEP update  seated modified piriformis stretch with LE resting on table;   instruction in posture and body mechanics handout    Person(s) Educated  Patient    Methods  Explanation;Demonstration;Verbal cues;Handout    Comprehension  Verbalized understanding;Returned demonstration;Verbal cues required       PT Short Term Goals - 06/27/19 1111      PT SHORT TERM GOAL #1   Title  Patient will be independent with initial HEP    Status  On-going    Target Date  07/10/19      PT SHORT TERM GOAL #2   Title  Patient will verbalize/demonstrate understanding of neutral spine posture and proper body mechanics to reduce strain on thoracolumbar spine    Status  On-going    Target Date  07/10/19        PT Long Term Goals - 06/27/19 1112      PT LONG TERM GOAL #1   Title  Patient will be independent with ongoing/advanced HEP    Status  On-going       PT LONG TERM GOAL #2   Title  Patient to demonstrate appropriate posture and body mechanics needed for daily activities    Status  On-going  PT LONG TERM GOAL #3   Title  B hip and knee strength grossly >/= 4+/5 for improved stability    Status  On-going      PT LONG TERM GOAL #4   Title  Patient to report ability to perform ADLs, household and yardwork-related tasks within limits of MD restrictions without increased pain >/= 3/10    Status  On-going      PT LONG TERM GOAL #5   Status  On-going            Plan - 06/27/19 1112    Clinical Impression Statement  Mr. Kulish reporting he has been performing HEP daily and able to verbalize/demonstrate good understanding today with review.  Notes HEP seems to be getting him "looser" over this past week.  Tolerated all therex today with 1/10 LBP which was unchanged with subjective report.  Did spend part of session instructing pt. in proper posture and body mechanics with daily tasks as to reduce lumbar strain.  Handout referenced (see pt. education section) and pt. verbalizing understanding.  [place] thus scheduled to return to PT on 07/08/19.    Comorbidities  osteomyelitis; peripheral neuropathy/B foot/LE numbness; HTN; R RTC repair 2010; additional PMHx as above    Rehab Potential  Good    PT Treatment/Interventions  ADLs/Self Care Home Management;Cryotherapy;Electrical Stimulation;Moist Heat;Gait training;Stair training;Functional mobility training;Therapeutic activities;Therapeutic exercise;Balance training;Neuromuscular re-education;Patient/family education;Manual techniques;Scar mobilization;Passive range of motion;Dry needling;Taping    PT Next Visit Plan  Further posture and body mechanics education prn; Core and LE strengthening; Thoracic extension exercises; Manual therapy & modalities PRN    PT Home Exercise Plan  06/19/19 - HS stretch, abdominal bracing + brace marching & bent knee fall-outs,   modified piriformis stretch in seated with LE resting on table    Consulted and Agree with Plan of Care  Patient       Patient will benefit from skilled therapeutic intervention in order to improve the following deficits and impairments:  Abnormal gait, Decreased activity tolerance, Decreased balance, Decreased endurance, Decreased knowledge of precautions, Decreased mobility, Decreased range of motion, Decreased safety awareness, Decreased strength, Difficulty walking, Increased muscle spasms, Impaired perceived functional ability, Impaired flexibility, Impaired sensation, Improper body mechanics, Postural dysfunction, Pain  Visit Diagnosis: Pain in thoracic spine  Chronic right-sided low back pain without sciatica  Radiculopathy, thoracolumbar region  Muscle weakness (generalized)  Difficulty in walking, not elsewhere classified     Problem List Patient Active Problem List   Diagnosis Date Noted  . Alcohol abuse 05/09/2019  . Hyponatremia 05/05/2019  . 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[place]) 04/04/2019  . Fusion of spine of thoracolumbar region 03/12/2019  . Status post thoracic spinal fusion 02/12/2019  . 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 Thoracic spinal stenosis 04/17/2017  . Painful orthopaedic hardware ([place]) 04/17/2017  . 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 ([place]) 04/17/2017  . Low back pain radiating to both legs 01/16/2017  . Esophageal reflux 02/10/2015  . Medicare annual wellness visit, subsequent 02/10/2015  . Personal history of colonic polyps 10/27/2012  . 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  . BPH (benign prostatic hyperplasia) 04/23/2012  . ED (erectile dysfunction) 04/23/2012  . Allergic state 10/25/2011  . Anxiety and depression 10/25/2011  . Asthma   . Diabetes mellitus without complication ([place])   . Hyperlipidemia   . Overweight   . Insomnia   .  Fatigue   .  Preventative health care   . DDD (degenerative disc disease), lumbosacral   . Thoracic degenerative disc disease   . HTN (hypertension)     Bess Harvest, PTA 06/27/19 1:01 PM    [facility] [address]  [place], [place], [address] Phone: [phone]   Fax:  [phone]  Name: Luis Brown MRN: [id number] Date of Birth: [birth year]

## 2019-06-27 NOTE — Telephone Encounter (Signed)
Medication Refill - :testosterone cypionate (DEPOTESTOSTERONE CYPIONATE) 200 MG/ML injection [id number]      Preferred Pharmacy (with phone number or street name):  [id number] - [place], [place]  [address] [place] [address]  Phone: [phone] Fax: [phone]     Agent: Please be advised that RX refills may take up to 3 business days. We ask that you follow-up with your pharmacy.

## 2019-06-27 NOTE — Telephone Encounter (Signed)
Requested medication (s) are due for refill today: yes  Requested medication (s) are on the active medication list: yes  Last refill:  02/05/2019  Future visit scheduled: no  Notes to clinic:  no assigned protocol    Requested Prescriptions  Pending Prescriptions Disp Refills   testosterone cypionate (DEPOTESTOSTERONE CYPIONATE) 200 MG/ML injection 1 mL 5    Sig: Inject 1 mL (200 mg total) into the muscle every 14 (fourteen) days.      Off-Protocol Failed - 06/27/2019  1:32 PM      Failed - Medication not assigned to a protocol, review manually.      Passed - Valid encounter within last 12 months    Recent Outpatient Visits           2 months ago Anxiety and depression   [facility] at [place], [place]   2 months ago Anxiety and depression   [facility]ivist at [place], [place]   5 months ago Leg swelling   [facility]ivist at [place], PA-C   6 months ago Constipation, unspecified constipation type   [facility]ivist at [place], [place]   9 months ago Testosterone deficiency   [facility] at [facility], [place], MD

## 2019-06-27 NOTE — Telephone Encounter (Signed)
Can you refill in [place] absence.  Patient is up to date on follow ups.

## 2019-06-28 ENCOUNTER — Encounter: Payer: Self-pay | Admitting: Specialist

## 2019-06-28 ENCOUNTER — Ambulatory Visit (INDEPENDENT_AMBULATORY_CARE_PROVIDER_SITE_OTHER): Payer: Medicare Other | Admitting: *Deleted

## 2019-06-28 DIAGNOSIS — R7989 Other specified abnormal findings of blood chemistry: Secondary | ICD-10-CM

## 2019-06-28 MED ORDER — TESTOSTERONE CYPIONATE 200 MG/ML IM SOLN
200.0000 mg | Freq: Once | INTRAMUSCULAR | Status: AC
  Administered 2019-06-28: 200 mg via INTRAMUSCULAR

## 2019-06-28 NOTE — Progress Notes (Signed)
Patient heretestosterone injection  Injectiongiven and patient tolerated well.  Appointment scheduled for 2 weeks.

## 2019-07-02 ENCOUNTER — Other Ambulatory Visit: Payer: Self-pay | Admitting: Medical

## 2019-07-03 ENCOUNTER — Encounter: Payer: Self-pay | Admitting: Specialist

## 2019-07-04 ENCOUNTER — Other Ambulatory Visit: Payer: Self-pay | Admitting: Medical

## 2019-07-04 ENCOUNTER — Other Ambulatory Visit: Payer: Self-pay | Admitting: Radiology

## 2019-07-04 ENCOUNTER — Telehealth: Payer: Self-pay | Admitting: Medical

## 2019-07-04 MED ORDER — HYDROCODONE-ACETAMINOPHEN 5-325 MG PO TABS: 1.0000 | ORAL_TABLET | Freq: Four times a day (QID) | ORAL | 0 refills | Status: DC | PRN

## 2019-07-04 MED ORDER — TESTOSTERONE CYPIONATE 200 MG/ML IM SOLN: 200.0000 mg | INTRAMUSCULAR | 5 refills | Status: DC

## 2019-07-04 NOTE — Telephone Encounter (Signed)
Refilled pt testosterone.

## 2019-07-04 NOTE — Telephone Encounter (Signed)
Medication Refill - Medication: testosterone inj  Has the patient contacted their pharmacy? Yes.   (Agent: If no, request that the patient contact the pharmacy for the refill.) (Agent: If yes, when and what did the pharmacy advise?)  Preferred Pharmacy (with phone number or street name):  [id number] - [place], [place]  [address] [place] [address]  Phone: [phone] Fax: [phone]  Not a 24 hour pharmacy; exact hours not known.     Agent: Please be advised that RX refills may take up to 3 business days. We ask that you follow-up with your pharmacy.

## 2019-07-04 NOTE — Telephone Encounter (Signed)
Requested medication (s) are due for refill today: yes  Requested medication (s) are on the active medication list:yes  Last refill: 06/27/2019  Future visit scheduled: no  Notes to clinic:  pharmacy states that they didn't receive script and to resend Pleas advise    Requested Prescriptions  Pending Prescriptions Disp Refills   testosterone cypionate (DEPOTESTOSTERONE CYPIONATE) 200 MG/ML injection 1 mL 5    Sig: Inject 1 mL (200 mg total) into the muscle every 14 (fourteen) days.      Off-Protocol Failed - 07/04/2019  1:47 PM      Failed - Medication not assigned to a protocol, review manually.      Passed - Valid encounter within last 12 months    Recent Outpatient Visits           2 months ago Anxiety and depression   [facility] at [place], [place]   2 months ago Anxiety and depression   Archivist at [place], [place]   5 months ago Leg swelling   Archivist at [place], PA-C   7 months ago Constipation, unspecified constipation type   Archivist at [place], [place]   9 months ago Testosterone deficiency   [facility] at [facility], Gay Filler, MD       Future Appointments             In 1 week Jessy Oto, MD [place]   In 3 weeks Jessy Oto, MD [facility]

## 2019-07-04 NOTE — Telephone Encounter (Signed)
Rx refilled today.

## 2019-07-05 ENCOUNTER — Encounter: Payer: Self-pay | Admitting: Medical

## 2019-07-05 ENCOUNTER — Other Ambulatory Visit: Payer: Self-pay | Admitting: Family Medicine

## 2019-07-08 ENCOUNTER — Encounter: Payer: Self-pay | Admitting: Physical Therapy

## 2019-07-08 ENCOUNTER — Ambulatory Visit: Payer: Medicare Other | Admitting: Physical Therapy

## 2019-07-08 ENCOUNTER — Other Ambulatory Visit: Payer: Self-pay

## 2019-07-08 DIAGNOSIS — M6281 Muscle weakness (generalized): Secondary | ICD-10-CM

## 2019-07-08 DIAGNOSIS — M545 Low back pain, unspecified: Secondary | ICD-10-CM

## 2019-07-08 DIAGNOSIS — M5415 Radiculopathy, thoracolumbar region: Secondary | ICD-10-CM

## 2019-07-08 DIAGNOSIS — M546 Pain in thoracic spine: Secondary | ICD-10-CM | POA: Diagnosis not present

## 2019-07-08 DIAGNOSIS — G8929 Other chronic pain: Secondary | ICD-10-CM

## 2019-07-08 DIAGNOSIS — R262 Difficulty in walking, not elsewhere classified: Secondary | ICD-10-CM | POA: Diagnosis not present

## 2019-07-08 MED ORDER — TESTOSTERONE CYPIONATE 200 MG/ML IM SOLN: 200.0000 mg | INTRAMUSCULAR | 5 refills | Status: DC

## 2019-07-08 NOTE — Therapy (Signed)
[facility] [place] [address]  [place], [place], [address] Phone: [phone]   Fax:  [phone]  Physical Therapy Treatment  Patient Details  Name: Luis Brown MRN: [id number] Date of Birth: [birth year] Referring Provider (PT): Larae Grooms   Encounter Date: 07/08/2019  PT End of Session - 07/08/19 1015    Visit Number  3    Number of Visits  16    Date for PT Re-Evaluation  08/14/19    Authorization Type  [employer] Medicare - $40 copay    Authorization - Number of Visits  --   Medicare guidelines   PT Start Time  9476    PT Stop Time  1059    PT Time Calculation (min)  44 min    Activity Tolerance  Patient tolerated treatment well    Behavior During Therapy  [facility] for tasks assessed/performed       Past Medical History:  Diagnosis Date  . Acute pharyngitis 10/21/2013  . Allergy    grass and pollen  . Anxiety and depression 10/25/2011  . BPH (benign prostatic hyperplasia) 04/23/2012  . Chicken pox as a child  . DDD (degenerative disc disease)    cervical responds to steroid injections and low back required surgery  . DDD (degenerative disc disease), lumbosacral   . Diabetes mellitus    pre  . Dyspnea   . ED (erectile dysfunction) 04/23/2012  . Elevated BP   . Esophageal reflux 02/10/2015  . Fatigue   . HTN (hypertension)   . Hyperglycemia    preDM   . Hyperlipidemia   . Insomnia   . Low back pain radiating to both legs 01/16/2017  . Measles as a child  . Medicare annual wellness visit, subsequent 02/10/2015  . Overweight(278.02)   . Personal history of colonic polyps 10/27/2012   Follows with [facility]  . Pneumonia    " walking"  . Preventative health care   . 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  . Wears glasses     Past Surgical History:  Procedure Laterality Date  . BACK SURGERY  2012 and 1994   Dr Margret Chance, screws and cage in low back  . EYE SURGERY Bilateral    2016  . HARDWARE REVISION   03/12/2019   REVISION OF HARDWARE THORACIC TEN-THORACIC ELEVEN WITH APPLICATION OF ADDITIONAL RODS AND ROD SLEEVES THORACIC EIGHT-THORACIC TWELVE 12-LUMBAR ONE (N/A )  . LAMINECTOMY  02/12/2019   DECOMPRESSIVE LAMINECTOMY THORACIC NINE-THORACIC TEN AND THORACIC TEN-THORACIC ELEVEN, EXTENSION OF THORACOLUMBAR FUSION FROM THORACIC TEN TO THORACIC FIVE, LOCAL BONE GRAFT, ALLOGRAFT AND VIVIGEN (N/A)  . SPINAL FUSION N/A 02/12/2019   Procedure: DECOMPRESSIVE LAMINECTOMY THORACIC NINE-THORACIC TEN AND THORACIC TEN-THORACIC ELEVEN, EXTENSION OF THORACOLUMBAR FUSION FROM THORACIC TEN TO THORACIC FIVE, LOCAL BONE GRAFT, ALLOGRAFT AND VIVIGEN;  Surgeon: Jessy Oto, MD;  Location: [facility];  Service: Orthopedics;  Laterality: N/A;  DECOMPRESSIVE LAMINECTOMY THORACIC NINE-THORACIC TEN AND THORACIC TEN-THORACIC ELEVEN, EXTENSION OF TH  . SPINAL FUSION N/A 03/12/2019   Procedure: REVISION OF HARDWARE THORACIC TEN-THORACIC ELEVEN WITH APPLICATION OF ADDITIONAL RODS AND ROD SLEEVES THORACIC EIGHT-THORACIC TWELVE 12-LUMBAR ONE;  Surgeon: Jessy Oto, MD;  Location: [place];  Service: Orthopedics;  Laterality: N/A;  . TONSILLECTOMY    . TONSILLECTOMY     as child  . torn rotator cuff  2010   right    There were no vitals filed for this visit.  Subjective Assessment - 07/08/19 1020    Subjective  Pt reporting pain has been getting better, now mostly intermittent related to muscle spams. Pt reporting he found the posture and body mechanics information helpful.    Pertinent History  back surgery x 5 - most recent T5-S1 thoracolumbar fusion revision on 03/12/19    Patient Stated Goals  "be stronger and have more stamina and be able to do everything w/in limits that MD will allow"    Currently in Pain?  Yes    Pain Score  1    1-2/10   Pain Location  Back    Pain Orientation  Mid    Pain Descriptors / Indicators  Spasm    Pain Type  Chronic pain    Pain Frequency  Intermittent         [facility] PT Assessment -  07/08/19 1015      Assessment   Medical Diagnosis  s/p thoracolumbar (T5-S1) fusion    Referring Provider (PT)  Larae Grooms    Onset Date/Surgical Date  03/12/19    Next MD Visit  07/26/19                   [facility] Adult PT Treatment/Exercise - 07/08/19 1015      Exercises   Exercises  Lumbar      Lumbar Exercises: Stretches   Figure 4 Stretch  30 seconds;2 reps;Seated    Figure 4 Stretch Limitations  B seated hip hinge      Lumbar Exercises: Aerobic   Nustep  L3 x 6 min (LE only)      Lumbar Exercises: Standing   Scapular Retraction  Both;10 reps;Theraband;Strengthening    Theraband Level (Scapular Retraction)  Level 2 (Red)    Scapular Retraction Limitations  low row    Row  Right;10 reps;Theraband;Strengthening    Theraband Level (Row)  Level 2 (Red)    Row Limitations  cues for scap retraction/depression and thoracic extension, avoiding upward rotation of elbows       Lumbar Exercises: Seated   Other Seated Lumbar Exercises  B scap retraction + shoulder horiz ABD & UE diagonals with red TB 10 x 3-5" each      Lumbar Exercises: Supine   Ab Set  10 reps;5 seconds    AB Set Limitations  cues to clarify bracing w/o pelvic rock/tilt    Clam  10 reps;3 seconds    Clam Limitations  hooklying alt hip ABD/ER with red TB    Bent Knee Raise  10 reps;3 seconds    Bent Knee Raise Limitations  brace marching with red TB    Dead Bug  10 reps;3 seconds    Dead Bug Limitations  from hooklying             PT Education - 07/08/19 1059    Education Details  HEP update - red TB added to hooklying clams & march, red TB scap retraction + horiz ABD, UE diagonals, rows & low rows       PT Short Term Goals - 07/08/19 1023      PT SHORT TERM GOAL #1   Title  Patient will be independent with initial HEP    Status  Achieved      PT SHORT TERM GOAL #2   Title  Patient will verbalize/demonstrate understanding of neutral spine posture and proper body mechanics to reduce  strain on thoracolumbar spine    Status  Achieved        PT Long Term Goals - 06/27/19 1112  PT LONG TERM GOAL #1   Title  Patient will be independent with ongoing/advanced HEP    Status  On-going      PT LONG TERM GOAL #2   Title  Patient to demonstrate appropriate posture and body mechanics needed for daily activities    Status  On-going      PT LONG TERM GOAL #3   Title  B hip and knee strength grossly >/= 4+/5 for improved stability    Status  On-going      PT LONG TERM GOAL #4   Title  Patient to report ability to perform ADLs, household and yardwork-related tasks within limits of MD restrictions without increased pain >/= 3/10    Status  On-going      PT LONG TERM GOAL #5   Status  On-going            Plan - 07/08/19 1024    Clinical Impression Statement  Luis Brown reporting posture and body mechanics information has been very helpful and denies and questions or concerns with this information or the initial HEP - STGs met. He reports pain has been improving, now typically only associated with muscle spasms but less frequent and intense than previously. Able to add red TB resistance to hook lying lumbopelvic strengthening stabilization with good tolerance. Introduced upper thoracic strengthening/stabilization with patient requiring cues for good scapular retraction and depression avoiding forward rotation of shoulders but otherwise well tolerated.    Personal Factors and Comorbidities  Age;Comorbidity 3+;Past/Current Experience;Time since onset of injury/illness/exacerbation    Comorbidities  osteomyelitis; peripheral neuropathy/B foot/LE numbness; HTN; R RTC repair 2010; additional PMHx as above    Examination-Activity Limitations  Bed Mobility;Bend;Caring for Others;Carry;Lift;Locomotion Level;Reach Overhead;Sit;Squat;Stairs;Stand    Examination-Participation Restrictions  Cleaning;Driving;Laundry;Yard Work;Community Activity    Rehab Potential  Good    PT Frequency  2x  / week    PT Duration  8 weeks    PT Treatment/Interventions  ADLs/Self Care Home Management;Cryotherapy;Electrical Stimulation;Moist Heat;Gait training;Stair training;Functional mobility training;Therapeutic activities;Therapeutic exercise;Balance training;Neuromuscular re-education;Patient/family education;Manual techniques;Scar mobilization;Passive range of motion;Dry needling;Taping    PT Next Visit Plan  Core and LE strengthening; Thoracic extension exercises; Manual therapy & modalities PRN    PT Home Exercise Plan  06/19/19 - HS stretch, abdominal bracing + brace marching & bent knee fall-outs; 06/27/19 - seated modified piriformis stretch with LE resting on table; 07/08/19 - red TB added to hooklying clams & march, red TB scap retraction + horiz ABD, UE diagonals, rows & low rows    Consulted and Agree with Plan of Care  Patient       Patient will benefit from skilled therapeutic intervention in order to improve the following deficits and impairments:  Abnormal gait, Decreased activity tolerance, Decreased balance, Decreased endurance, Decreased knowledge of precautions, Decreased mobility, Decreased range of motion, Decreased safety awareness, Decreased strength, Difficulty walking, Increased muscle spasms, Impaired perceived functional ability, Impaired flexibility, Impaired sensation, Improper body mechanics, Postural dysfunction, Pain  Visit Diagnosis: Pain in thoracic spine  Chronic right-sided low back pain without sciatica  Radiculopathy, thoracolumbar region  Muscle weakness (generalized)  Difficulty in walking, not elsewhere classified     Problem List Patient Active Problem List   Diagnosis Date Noted  . Alcohol abuse 05/09/2019  . Hyponatremia 05/05/2019  . 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[place]) 04/04/2019  . Fusion of spine of thoracolumbar region 03/12/2019  . Status post thoracic spinal fusion 02/12/2019  . Spinal stenosis of lumbar region with neurogenic  claudication 05/23/2017  .  Thoracic spinal stenosis 04/17/2017  . Painful orthopaedic hardware ([place]) 04/17/2017  . 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 ([place]) 04/17/2017  . Low back pain radiating to both legs 01/16/2017  . Esophageal reflux 02/10/2015  . Medicare annual wellness visit, subsequent 02/10/2015  . Personal history of colonic polyps 10/27/2012  . 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  . BPH (benign prostatic hyperplasia) 04/23/2012  . ED (erectile dysfunction) 04/23/2012  . Allergic state 10/25/2011  . Anxiety and depression 10/25/2011  . Asthma   . Diabetes mellitus without complication ([place])   . Hyperlipidemia   . Overweight   . Insomnia   . Fatigue   . Preventative health care   . DDD (degenerative disc disease), lumbosacral   . Thoracic degenerative disc disease   . HTN (hypertension)     Percival Spanish, PT, MPT 07/08/2019, 11:41 AM  [facility] [address]  [place], [place], [address] Phone: [phone]   Fax:  [phone]  Name: Luis Brown MRN: [id number] Date of Birth: May 29, 1946

## 2019-07-08 NOTE — Telephone Encounter (Signed)
Rx testosterone sent to pt pharmacy. Notify pt.

## 2019-07-08 NOTE — Patient Instructions (Signed)
    Home exercise program created by Onie Hayashi, PT.  For questions, please contact Koya Hunger via phone at [phone] or email at [email or web address]  [facility] [place] [address]  [address] [place], [place], [address] Phone: [phone]   Fax:  [phone]    

## 2019-07-08 NOTE — Addendum Note (Signed)
Addended by: Anabel Halon on: 07/08/2019 10:22 PM   Modules accepted: Orders

## 2019-07-08 NOTE — Telephone Encounter (Signed)
The refill was never sent in because it shows up as phone in.  Can you resend in please?

## 2019-07-09 ENCOUNTER — Encounter: Payer: Self-pay | Admitting: Family Medicine

## 2019-07-11 ENCOUNTER — Ambulatory Visit: Payer: Medicare Other | Admitting: Physical Therapy

## 2019-07-11 ENCOUNTER — Other Ambulatory Visit: Payer: Self-pay

## 2019-07-11 ENCOUNTER — Encounter: Payer: Self-pay | Admitting: Physical Therapy

## 2019-07-11 ENCOUNTER — Encounter: Payer: Self-pay | Admitting: Specialist

## 2019-07-11 DIAGNOSIS — M6281 Muscle weakness (generalized): Secondary | ICD-10-CM | POA: Diagnosis not present

## 2019-07-11 DIAGNOSIS — G8929 Other chronic pain: Secondary | ICD-10-CM | POA: Diagnosis not present

## 2019-07-11 DIAGNOSIS — M5415 Radiculopathy, thoracolumbar region: Secondary | ICD-10-CM | POA: Diagnosis not present

## 2019-07-11 DIAGNOSIS — R262 Difficulty in walking, not elsewhere classified: Secondary | ICD-10-CM

## 2019-07-11 DIAGNOSIS — M545 Low back pain, unspecified: Secondary | ICD-10-CM

## 2019-07-11 DIAGNOSIS — M546 Pain in thoracic spine: Secondary | ICD-10-CM | POA: Diagnosis not present

## 2019-07-11 NOTE — Therapy (Signed)
[facility] [place] [address]  [place], [place], [address] Phone: [phone]   Fax:  [phone]  Physical Therapy Treatment  Patient Details  Name: Luis Brown MRN: [id number] Date of Birth: [birth year] Referring Provider (PT): Larae Grooms   Encounter Date: 07/11/2019  PT End of Session - 07/11/19 1015    Visit Number  4    Number of Visits  16    Date for PT Re-Evaluation  08/14/19    Authorization Type  [employer] Medicare - $40 copay    Authorization - Number of Visits  --   Medicare guidelines   PT Start Time  [id number]    PT Stop Time  1101    PT Time Calculation (min)  46 min    Activity Tolerance  Patient tolerated treatment well    Behavior During Therapy  [facility] for tasks assessed/performed       Past Medical History:  Diagnosis Date  . Acute pharyngitis 10/21/2013  . Allergy    grass and pollen  . Anxiety and depression 10/25/2011  . BPH (benign prostatic hyperplasia) 04/23/2012  . Chicken pox as a child  . DDD (degenerative disc disease)    cervical responds to steroid injections and low back required surgery  . DDD (degenerative disc disease), lumbosacral   . Diabetes mellitus    pre  . Dyspnea   . ED (erectile dysfunction) 04/23/2012  . Elevated BP   . Esophageal reflux 02/10/2015  . Fatigue   . HTN (hypertension)   . Hyperglycemia    preDM   . Hyperlipidemia   . Insomnia   . Low back pain radiating to both legs 01/16/2017  . Measles as a child  . Medicare annual wellness visit, subsequent 02/10/2015  . Overweight(278.02)   . Personal history of colonic polyps 10/27/2012   Follows with [facility]  . Pneumonia    " walking"  . Preventative health care   . 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  . Wears glasses     Past Surgical History:  Procedure Laterality Date  . BACK SURGERY  2012 and 1994   Dr Margret Chance, screws and cage in low back  . EYE SURGERY Bilateral    2016  . HARDWARE REVISION   03/12/2019   REVISION OF HARDWARE THORACIC TEN-THORACIC ELEVEN WITH APPLICATION OF ADDITIONAL RODS AND ROD SLEEVES THORACIC EIGHT-THORACIC TWELVE 12-LUMBAR ONE (N/A )  . LAMINECTOMY  02/12/2019   DECOMPRESSIVE LAMINECTOMY THORACIC NINE-THORACIC TEN AND THORACIC TEN-THORACIC ELEVEN, EXTENSION OF THORACOLUMBAR FUSION FROM THORACIC TEN TO THORACIC FIVE, LOCAL BONE GRAFT, ALLOGRAFT AND VIVIGEN (N/A)  . SPINAL FUSION N/A 02/12/2019   Procedure: DECOMPRESSIVE LAMINECTOMY THORACIC NINE-THORACIC TEN AND THORACIC TEN-THORACIC ELEVEN, EXTENSION OF THORACOLUMBAR FUSION FROM THORACIC TEN TO THORACIC FIVE, LOCAL BONE GRAFT, ALLOGRAFT AND VIVIGEN;  Surgeon: Jessy Oto, MD;  Location: [place];  Service: Orthopedics;  Laterality: N/A;  DECOMPRESSIVE LAMINECTOMY THORACIC NINE-THORACIC TEN AND THORACIC TEN-THORACIC ELEVEN, EXTENSION OF TH  . SPINAL FUSION N/A 03/12/2019   Procedure: REVISION OF HARDWARE THORACIC TEN-THORACIC ELEVEN WITH APPLICATION OF ADDITIONAL RODS AND ROD SLEEVES THORACIC EIGHT-THORACIC TWELVE 12-LUMBAR ONE;  Surgeon: Jessy Oto, MD;  Location: [place];  Service: Orthopedics;  Laterality: N/A;  . TONSILLECTOMY    . TONSILLECTOMY     as child  . torn rotator cuff  2010   right    There were no vitals filed for this visit.  Subjective Assessment - 07/11/19 1018    Subjective  Pt reporting onset of a muscle spasm last night in his mid thoracic paraspinals - states he has been using topical analgeisics on this.    Pertinent History  back surgery x 5 - most recent T5-S1 thoracolumbar fusion revision on 03/12/19    Patient Stated Goals  "be stronger and have more stamina and be able to do everything w/in limits that MD will allow"    Currently in Pain?  Yes    Pain Score  3     Pain Location  Thoracic    Pain Orientation  Mid    Pain Descriptors / Indicators  Spasm    Pain Type  Chronic pain    Pain Frequency  Intermittent                       [facility] Adult PT  Treatment/Exercise - 07/11/19 1015      Exercises   Exercises  Lumbar      Lumbar Exercises: Stretches   Other Lumbar Stretch Exercise  seated B rhomboid stretch 2 x 30 sec    Other Lumbar Stretch Exercise         Lumbar Exercises: Standing   Scapular Retraction  Both;10 reps;Theraband;Strengthening    Theraband Level (Scapular Retraction)  Level 2 (Red)    Scapular Retraction Limitations  low row - cues to avoid excessive shoulder extension or upward rotation of elbows    Row  Right;10 reps;Theraband;Strengthening    Theraband Level (Row)  Level 2 (Red)      Lumbar Exercises: Seated   Long Arc Quad on Chair  20 reps;Weights    Other Seated Lumbar Exercises  B scap retraction + shoulder horiz ABD & UE diagonals with red TB 10 x 5" each      Manual Therapy   Manual Therapy  Soft tissue mobilization;Myofascial release;Taping    Manual therapy comments  prone    Soft tissue mobilization  R thoracic paraspinals, rhomboids and mid/lower traps - ttp with multiple taut bands/TPs identified    Myofascial Release  manual TPR to R thoracic paraspinals, rhomboids and mid/lower traps; pin & stretch longitudinally along thoracic paraspinals - pt noting some relief of muscle spams/ttp    Kinesiotex  Inhibit Muscle      Kinesiotix   Inhibit Muscle   30% along B thoracolumbar parapsinals             PT Education - 07/11/19 1040    Education Details  Kinesiotape wearing instructions    Person(s) Educated  Patient    Methods  Explanation;Demonstration;Handout    Comprehension  Verbalized understanding       PT Short Term Goals - 07/08/19 1023      PT SHORT TERM GOAL #1   Title  Patient will be independent with initial HEP    Status  Achieved      PT SHORT TERM GOAL #2   Title  Patient will verbalize/demonstrate understanding of neutral spine posture and proper body mechanics to reduce strain on thoracolumbar spine    Status  Achieved        PT Long Term Goals - 06/27/19 1112       PT LONG TERM GOAL #1   Title  Patient will be independent with ongoing/advanced HEP    Status  On-going      PT LONG TERM GOAL #2   Title  Patient to demonstrate appropriate posture and body mechanics needed for daily activities    Status  On-going      PT LONG TERM GOAL #3   Title  B hip and knee strength grossly >/= 4+/5 for improved stability    Status  On-going      PT LONG TERM GOAL #4   Title  Patient to report ability to perform ADLs, household and yardwork-related tasks within limits of MD restrictions without increased pain >/= 3/10    Status  On-going      PT LONG TERM GOAL #5   Status  On-going            Plan - 07/11/19 1022    Clinical Impression Statement  Luis Brown reporting onset of mid thoracic muscle spasm last night which seems to be improving today but still noting increased pain/soreness. Increased muscle tension with taut bands/TPs noted in R mid thoracic paraspinals, rhomboids and mid/lower traps. Addressed these areas with STM/MFR followed by kinesiotape application with patient noting less ttp and feeling of muscle spasm following manual therapy. Patient instructed in seated rhomboid stretch varying arm position to best target areas of tightness/increased muscle tension. Remainder of session focused on review mid/upper thoracic and scapular strengthening/stabilization with minimal cues to ensure proper posture and alignment. Luis Brown reporting he feels like therapy has been helping and is pleased with his progress thus far.    Personal Factors and Comorbidities  Age;Comorbidity 3+;Past/Current Experience;Time since onset of injury/illness/exacerbation    Comorbidities  osteomyelitis; peripheral neuropathy/B foot/LE numbness; HTN; R RTC repair 2010; additional PMHx as above    Examination-Activity Limitations  Bed Mobility;Bend;Caring for Others;Carry;Lift;Locomotion Level;Reach Overhead;Sit;Squat;Stairs;Stand    Examination-Participation Restrictions   Cleaning;Driving;Laundry;Yard Work;Community Activity    Rehab Potential  Good    PT Frequency  2x / week    PT Duration  8 weeks    PT Treatment/Interventions  ADLs/Self Care Home Management;Cryotherapy;Electrical Stimulation;Moist Heat;Gait training;Stair training;Functional mobility training;Therapeutic activities;Therapeutic exercise;Balance training;Neuromuscular re-education;Patient/family education;Manual techniques;Scar mobilization;Passive range of motion;Dry needling;Taping    PT Next Visit Plan  Assess response to kinesiotaping; Core and LE strengthening; Thoracic extension exercises; Manual therapy & modalities PRN    PT Home Exercise Plan  06/19/19 - HS stretch, abdominal bracing + brace marching & bent knee fall-outs; 06/27/19 - seated modified piriformis stretch with LE resting on table; 07/08/19 - red TB added to hooklying clams & march, red TB scap retraction + horiz ABD, UE diagonals, rows & low rows    Consulted and Agree with Plan of Care  Patient       Patient will benefit from skilled therapeutic intervention in order to improve the following deficits and impairments:  Abnormal gait, Decreased activity tolerance, Decreased balance, Decreased endurance, Decreased knowledge of precautions, Decreased mobility, Decreased range of motion, Decreased safety awareness, Decreased strength, Difficulty walking, Increased muscle spasms, Impaired perceived functional ability, Impaired flexibility, Impaired sensation, Improper body mechanics, Postural dysfunction, Pain  Visit Diagnosis: Pain in thoracic spine  Chronic right-sided low back pain without sciatica  Radiculopathy, thoracolumbar region  Muscle weakness (generalized)  Difficulty in walking, not elsewhere classified     Problem List Patient Active Problem List   Diagnosis Date Noted  . Alcohol abuse 05/09/2019  . Hyponatremia 05/05/2019  . 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[place]) 04/04/2019  . Fusion of spine of  thoracolumbar region 03/12/2019  . Status post thoracic spinal fusion 02/12/2019  . 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 Thoracic spinal stenosis 04/17/2017  . Painful orthopaedic hardware ([place]) 04/17/2017  . 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 ([place]) 04/17/2017  . Low back pain radiating to both legs 01/16/2017  . Esophageal reflux 02/10/2015  . Medicare annual wellness visit, subsequent 02/10/2015  . Personal history of colonic polyps 10/27/2012  . 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  . BPH (benign prostatic hyperplasia) 04/23/2012  . ED (erectile dysfunction) 04/23/2012  . Allergic state 10/25/2011  . Anxiety and depression 10/25/2011  . Asthma   . Diabetes mellitus without complication ([place])   . Hyperlipidemia   . Overweight   . Insomnia   . Fatigue   . Preventative health care   . DDD (degenerative disc disease), lumbosacral   . Thoracic degenerative disc disease   . HTN (hypertension)     Percival Spanish, PT, MPT 07/11/2019, 11:29 AM  [facility] [address]  [place], [place], [address] Phone: [phone]   Fax:  [phone]  Name: Luis Brown MRN: [id number] Date of Birth: Aug 19, 1945

## 2019-07-11 NOTE — Patient Instructions (Signed)
   Kinesiology tape  What is kinesiology tape?  There are many brands of kinesiology tape. KTape, Rock Tape, Body Sport, Dynamic tape, to name a few.  It is an elasticized tape designed to support the body's natural healing process. This tape provides stability and support to muscles and joints without restricting motion.  It can also help decrease swelling in the area of application.  How does it work?  The tape microscopically lifts and decompresses the skin to allow for drainage of lymph (swelling) to flow away from area, reducing inflammation. The tape has the ability to help re-educate the neuromuscular system by targeting specific receptors in the skin. The presence of the tape increases the body's awareness of posture and body mechanics.  Do not use with:  . Open wounds . Skin lesions . Adhesive allergies  In some rare cases, mild/moderate skin irritation can occur. This can include redness, itchiness, or hives. If this occurs, immediately remove tape and consult your primary care physician if symptoms are severe or do not resolve within 2 days.  Safe removal of the tape:  To remove tape safely, hold nearby skin with one hand and gentle roll tape down with other hand. You can apply oil or conditioner to tape while in shower prior to removal to loosen adhesive. DO NOT swiftly rip tape off like a band-aid, as this could cause skin tears and additional skin irritation.     For questions, please contact your therapist at:  [facility] [address]  [address] [place], [place], [address] Phone: [phone]   Fax:  [phone]     

## 2019-07-15 ENCOUNTER — Other Ambulatory Visit: Payer: Self-pay | Admitting: Radiology

## 2019-07-15 MED ORDER — HYDROCODONE-ACETAMINOPHEN 5-325 MG PO TABS: 1.0000 | ORAL_TABLET | Freq: Four times a day (QID) | ORAL | 0 refills | Status: DC | PRN

## 2019-07-16 ENCOUNTER — Ambulatory Visit: Payer: Medicare Other

## 2019-07-16 ENCOUNTER — Telehealth: Payer: Self-pay | Admitting: Medical

## 2019-07-16 ENCOUNTER — Ambulatory Visit (INDEPENDENT_AMBULATORY_CARE_PROVIDER_SITE_OTHER): Payer: Medicare Other

## 2019-07-16 ENCOUNTER — Other Ambulatory Visit: Payer: Self-pay

## 2019-07-16 DIAGNOSIS — R7989 Other specified abnormal findings of blood chemistry: Secondary | ICD-10-CM

## 2019-07-16 MED ORDER — TESTOSTERONE CYPIONATE 200 MG/ML IM SOLN
200.0000 mg | INTRAMUSCULAR | Status: DC
  Administered 2019-07-16: 10:00:00 200 mg via INTRAMUSCULAR

## 2019-07-16 NOTE — Telephone Encounter (Signed)
Patient called and he would like to speak with Percell Miller or his CMA on how he should be administrating this medication. Please call patient back, thanks

## 2019-07-16 NOTE — Telephone Encounter (Signed)
Spoke to patient about testosterone. Patient stated he asked if he would get his normal 6 doses at the pharmacy as usual not how he would administer the medication. Advised patient we sent 5 refills with the refill on 12/21 and he stated that was fine he will go pick up when they are ready he just wanted to make sure they had 6 on there so he will not have to run back to [facility] to get them at last minute.

## 2019-07-16 NOTE — Progress Notes (Signed)
Luis Brown is a 73 y.o. male presents to the office today for Testosterone injections, per physician's orders.  Testosterone 200mg  IM in RUQ was administered today. Patient tolerated injection. Patient next injection due: 2 weeks, appt made Yes  Gerilyn Nestle   Agree with injection of testosterone for injection for low T.  Mackie Pai, PA-C

## 2019-07-16 NOTE — Telephone Encounter (Signed)
Tried to contact patient regarding Testosterone.

## 2019-07-17 ENCOUNTER — Encounter: Payer: Self-pay | Admitting: Specialist

## 2019-07-17 ENCOUNTER — Encounter: Payer: Self-pay | Admitting: Medical

## 2019-07-17 ENCOUNTER — Ambulatory Visit (INDEPENDENT_AMBULATORY_CARE_PROVIDER_SITE_OTHER): Payer: Medicare Other | Admitting: Specialist

## 2019-07-17 ENCOUNTER — Ambulatory Visit: Payer: Self-pay

## 2019-07-17 VITALS — BP 114/71 | HR 80 | Ht 70.0 in | Wt 198.0 lb

## 2019-07-17 DIAGNOSIS — T84498A Other mechanical complication of other internal orthopedic devices, implants and grafts, initial encounter: Secondary | ICD-10-CM | POA: Diagnosis not present

## 2019-07-17 DIAGNOSIS — M4325 Fusion of spine, thoracolumbar region: Secondary | ICD-10-CM | POA: Diagnosis not present

## 2019-07-17 DIAGNOSIS — Z981 Arthrodesis status: Secondary | ICD-10-CM

## 2019-07-17 MED ORDER — TIZANIDINE HCL 4 MG PO TABS: 4.0000 mg | ORAL_TABLET | Freq: Four times a day (QID) | ORAL | 0 refills | Status: DC | PRN

## 2019-07-17 NOTE — Patient Instructions (Signed)
Avoid frequent bending and stooping  No lifting greater than 10 lbs. May use ice or moist heat for pain. Weight loss is of benefit.  Exercise is important to improve your indurance and does allow people to function better inspite of back pain. It is likely that upper extremity strengthening and extension exercises of the upper spine would tend to make the Area of delayed healing of the fusion and hardware breakage more painful. I would advise not to be perfoming upper  Extremity strengthening and back extension while we give the upper thoracic spine time to heal.   CT scan of the upper thoracic spine ordered to assess the hardware breakage and the position of the screws.

## 2019-07-17 NOTE — Progress Notes (Signed)
Office Visit Note   Patient: Luis Brown           Date of Birth: [birth year]           MRN: [id number] Visit Date: 07/17/2019              Requested by: Mackie Pai, PA-C [address] [place],  [place] [address] PCP: Mackie Pai, PA-C   Assessment & Plan: Visit Diagnoses:  1. Status post thoracic spinal fusion   2. 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 ([place])   3. Fusion of spine of thoracolumbar region   4. Arthrodesis status     Plan: Avoid frequent bending and stooping  No lifting greater than 10 lbs. May use ice or moist heat for pain. Weight loss is of benefit.  Exercise is important to improve your indurance and does allow people to function better inspite of back pain. It is likely that upper extremity strengthening and extension exercises of the upper spine would tend to make the Area of delayed healing of the fusion and hardware breakage more painful. I would advise not to be perfoming upper  Extremity strengthening and back extension while we give the upper thoracic spine time to heal.   CT scan of the upper thoracic spine ordered to assess the hardware breakage and the position of the screws.   Follow-Up Instructions: Return in about 2 weeks (around 07/31/2019).   Orders:  Orders Placed This Encounter  Procedures  . XR Lumbar Spine 2-3 Views   No orders of the defined types were placed in this encounter.     Procedures: No procedures performed   Clinical Data: No additional findings.   Subjective: Chief Complaint  Patient presents with  . Spine - Follow-up    HPI  Review of Systems   Objective: Vital Signs: BP 114/71 (BP Location: Left Arm, Patient Position: Sitting)   Pulse 80   Ht 5\' 10"  (1.778 m)   Wt 198 lb (89.8 kg)   BMI 28.41 kg/m   Physical Exam  Ortho Exam  Specialty Comments:  No specialty comments available.  Imaging: XR Lumbar Spine 2-3 Views  Result Date: 07/17/2019 AP and lateral thoracolumbar  spine shows the upper thoracic pedicle screws with some suggestion of superior migration. The right pedicle screw at the upper most segment is fractured within the vertebral body. The area of previous osteomyelitis appears to be going on to an autofusion. Finding suggested delayed healing of the upper aspect of the thoracolumbar fusion with new screw breakage right T5.    PMFS History: Patient Active Problem List   Diagnosis Date Noted  . Alcohol abuse 05/09/2019  . Hyponatremia 05/05/2019  . 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[place]) 04/04/2019  . Fusion of spine of thoracolumbar region 03/12/2019  . Status post thoracic spinal fusion 02/12/2019  . 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 Thoracic spinal stenosis 04/17/2017  . Painful orthopaedic hardware ([place]) 04/17/2017  . 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 ([place]) 04/17/2017  . Low back pain radiating to both legs 01/16/2017  . Esophageal reflux 02/10/2015  . Medicare annual wellness visit, subsequent 02/10/2015  . Personal history of colonic polyps 10/27/2012  . 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  . BPH (benign prostatic hyperplasia) 04/23/2012  . ED (erectile dysfunction) 04/23/2012  . Allergic state 10/25/2011  . Anxiety and depression 10/25/2011  . Asthma   . Diabetes mellitus without complication ([place])   .  Hyperlipidemia   . Overweight   . Insomnia   . Fatigue   . Preventative health care   . DDD (degenerative disc disease), lumbosacral   . Thoracic degenerative disc disease   . HTN (hypertension)    Past Medical History:  Diagnosis Date  . Acute pharyngitis 10/21/2013  . Allergy    grass and pollen  . Anxiety and depression 10/25/2011  . BPH (benign prostatic hyperplasia) 04/23/2012  . Chicken pox as a child  . DDD (degenerative disc disease)    cervical responds to steroid injections and low back required surgery  . DDD  (degenerative disc disease), lumbosacral   . Diabetes mellitus    pre  . Dyspnea   . ED (erectile dysfunction) 04/23/2012  . Elevated BP   . Esophageal reflux 02/10/2015  . Fatigue   . HTN (hypertension)   . Hyperglycemia    preDM   . Hyperlipidemia   . Insomnia   . Low back pain radiating to both legs 01/16/2017  . Measles as a child  . Medicare annual wellness visit, subsequent 02/10/2015  . Overweight(278.02)   . Personal history of colonic polyps 10/27/2012   Follows with [facility]  . Pneumonia    " walking"  . Preventative health care   . 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  . Wears glasses     Family History  Problem Relation Age of Onset  . Hypertension Mother   . Diabetes Mother        type 2  . Cancer Mother 69       breast in remission  . Emphysema Father        smoker  . COPD Father        smoker  . Stroke Father 27       mini  . Heart disease Father   . Hypertension Sister   . Hyperlipidemia Sister   . Scoliosis Sister   . Osteoporosis Sister   . Hypertension Maternal Grandmother   . Scoliosis Maternal Grandmother   . Heart disease Maternal Grandfather   . Heart disease Paternal Grandfather        smoker  . Heart disease Daughter     Past Surgical History:  Procedure Laterality Date  . BACK SURGERY  2012 and 1994   Dr Margret Chance, screws and cage in low back  . EYE SURGERY Bilateral    2016  . HARDWARE REVISION  03/12/2019   REVISION OF HARDWARE THORACIC TEN-THORACIC ELEVEN WITH APPLICATION OF ADDITIONAL RODS AND ROD SLEEVES THORACIC EIGHT-THORACIC TWELVE 12-LUMBAR ONE (N/A )  . LAMINECTOMY  02/12/2019   DECOMPRESSIVE LAMINECTOMY THORACIC NINE-THORACIC TEN AND THORACIC TEN-THORACIC ELEVEN, EXTENSION OF THORACOLUMBAR FUSION FROM THORACIC TEN TO THORACIC FIVE, LOCAL BONE GRAFT, ALLOGRAFT AND VIVIGEN (N/A)  . SPINAL FUSION N/A 02/12/2019   Procedure: DECOMPRESSIVE LAMINECTOMY THORACIC NINE-THORACIC TEN AND THORACIC TEN-THORACIC ELEVEN, EXTENSION OF  THORACOLUMBAR FUSION FROM THORACIC TEN TO THORACIC FIVE, LOCAL BONE GRAFT, ALLOGRAFT AND VIVIGEN;  Surgeon: Jessy Oto, MD;  Location: [place];  Service: Orthopedics;  Laterality: N/A;  DECOMPRESSIVE LAMINECTOMY THORACIC NINE-THORACIC TEN AND THORACIC TEN-THORACIC ELEVEN, EXTENSION OF TH  . SPINAL FUSION N/A 03/12/2019   Procedure: REVISION OF HARDWARE THORACIC TEN-THORACIC ELEVEN WITH APPLICATION OF ADDITIONAL RODS AND ROD SLEEVES THORACIC EIGHT-THORACIC TWELVE 12-LUMBAR ONE;  Surgeon: Jessy Oto, MD;  Location: [place];  Service: Orthopedics;  Laterality: N/A;  . TONSILLECTOMY    . TONSILLECTOMY     as child  . torn rotator cuff  2010   right   Social History   Occupational History  . Not on file  Tobacco Use  . Smoking status: Former Smoker    Packs/day: 1.00    Years: 20.00    Pack years: 20.00    Types: Cigarettes    Quit date: 07/18/1989    Years since quitting: 30.0  . Smokeless tobacco: Never Used  . Tobacco comment: off and on smoking for the 20 yrs  Substance and Sexual Activity  . Alcohol use: Yes    Comment: drink or two a day- mixed drink  . Drug use: No  . Sexual activity: Yes    Comment: lives with wife, still working, no dietary restrictions, continues to exercise intermittently

## 2019-07-18 ENCOUNTER — Other Ambulatory Visit: Payer: Self-pay

## 2019-07-18 ENCOUNTER — Ambulatory Visit: Payer: Medicare Other | Admitting: Physical Therapy

## 2019-07-18 DIAGNOSIS — M546 Pain in thoracic spine: Secondary | ICD-10-CM | POA: Diagnosis not present

## 2019-07-18 DIAGNOSIS — G8929 Other chronic pain: Secondary | ICD-10-CM

## 2019-07-18 DIAGNOSIS — M545 Low back pain, unspecified: Secondary | ICD-10-CM

## 2019-07-18 DIAGNOSIS — M6281 Muscle weakness (generalized): Secondary | ICD-10-CM

## 2019-07-18 DIAGNOSIS — M5415 Radiculopathy, thoracolumbar region: Secondary | ICD-10-CM

## 2019-07-18 DIAGNOSIS — R262 Difficulty in walking, not elsewhere classified: Secondary | ICD-10-CM

## 2019-07-18 NOTE — Therapy (Addendum)
[facility] [address]  [place], [place], [address] Phone: [phone]   Fax:  [phone]  Physical Therapy Treatment / Discharge Summary  Patient Details  Name: Luis Brown MRN: [id number] Date of Birth: 1946/02/12 Referring Provider (PT): Larae Grooms   Encounter Date: 07/18/2019  PT End of Session - 07/18/19 1017    Visit Number  5    Number of Visits  16    Date for PT Re-Evaluation  08/14/19    Authorization Type  [employer] Medicare - $40 copay    Authorization - Number of Visits  --   Medicare guidelines   PT Start Time  1017    PT Stop Time  1058    PT Time Calculation (min)  41 min    Activity Tolerance  Patient tolerated treatment well    Behavior During Therapy  [facility] for tasks assessed/performed       Past Medical History:  Diagnosis Date  . Acute pharyngitis 10/21/2013  . Allergy    grass and pollen  . Anxiety and depression 10/25/2011  . BPH (benign prostatic hyperplasia) 04/23/2012  . Chicken pox as a child  . DDD (degenerative disc disease)    cervical responds to steroid injections and low back required surgery  . DDD (degenerative disc disease), lumbosacral   . Diabetes mellitus    pre  . Dyspnea   . ED (erectile dysfunction) 04/23/2012  . Elevated BP   . Esophageal reflux 02/10/2015  . Fatigue   . HTN (hypertension)   . Hyperglycemia    preDM   . Hyperlipidemia   . Insomnia   . Low back pain radiating to both legs 01/16/2017  . Measles as a child  . Medicare annual wellness visit, subsequent 02/10/2015  . Overweight(278.02)   . Personal history of colonic polyps 10/27/2012   Follows with [facility]  . Pneumonia    " walking"  . Preventative health care   . 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  . Wears glasses     Past Surgical History:  Procedure Laterality Date  . BACK SURGERY  2012 and 1994   Dr Margret Chance, screws and cage in low back  . EYE SURGERY Bilateral    2016   . HARDWARE REVISION  03/12/2019   REVISION OF HARDWARE THORACIC TEN-THORACIC ELEVEN WITH APPLICATION OF ADDITIONAL RODS AND ROD SLEEVES THORACIC EIGHT-THORACIC TWELVE 12-LUMBAR ONE (N/A )  . LAMINECTOMY  02/12/2019   DECOMPRESSIVE LAMINECTOMY THORACIC NINE-THORACIC TEN AND THORACIC TEN-THORACIC ELEVEN, EXTENSION OF THORACOLUMBAR FUSION FROM THORACIC TEN TO THORACIC FIVE, LOCAL BONE GRAFT, ALLOGRAFT AND VIVIGEN (N/A)  . SPINAL FUSION N/A 02/12/2019   Procedure: DECOMPRESSIVE LAMINECTOMY THORACIC NINE-THORACIC TEN AND THORACIC TEN-THORACIC ELEVEN, EXTENSION OF THORACOLUMBAR FUSION FROM THORACIC TEN TO THORACIC FIVE, LOCAL BONE GRAFT, ALLOGRAFT AND VIVIGEN;  Surgeon: Jessy Oto, MD;  Location: [place];  Service: Orthopedics;  Laterality: N/A;  DECOMPRESSIVE LAMINECTOMY THORACIC NINE-THORACIC TEN AND THORACIC TEN-THORACIC ELEVEN, EXTENSION OF TH  . SPINAL FUSION N/A 03/12/2019   Procedure: REVISION OF HARDWARE THORACIC TEN-THORACIC ELEVEN WITH APPLICATION OF ADDITIONAL RODS AND ROD SLEEVES THORACIC EIGHT-THORACIC TWELVE 12-LUMBAR ONE;  Surgeon: Jessy Oto, MD;  Location: [place];  Service: Orthopedics;  Laterality: N/A;  . TONSILLECTOMY    . TONSILLECTOMY     as child  . torn rotator cuff  2010   right    There were no vitals filed for this visit.  Subjective Assessment - 07/18/19 1028  Subjective  Pt reporting surgeon identified broken hardware at the thoracic level of his fusion and has ordered a CT scan to furthre evaluate this. In the meantime, he wants patient to avoid UE strengthening - per MD note: "It is likely that upper extremity strengthening and extension exercises of the upper spine would tend to make the area of delayed healing of the fusion and hardware breakage more painful. I would advise not to be performing upper extremity strengthening and back extension while we give the upper thoracic spine time to heal."    Pertinent History  back surgery x 5 - most recent T5-S1  thoracolumbar fusion revision on 03/12/19    Diagnostic tests  XR Lumbar Spine 07/17/2019: AP and lateral thoracolumbar spine shows the upper thoracic pedicle screws with some suggestion of superior migration. The right pedicle screw at the upper most segment is fractured within the vertebral body. The area of previous osteomyelitis appears to be going on to an autofusion. Finding suggested delayed healing of the upper aspect of the thoracolumbar fusion with new screw breakage right T5.    Patient Stated Goals  "be stronger and have more stamina and be able to do everything w/in limits that MD will allow"    Currently in Pain?  Yes    Pain Score  2     Pain Location  Thoracic    Pain Orientation  Mid    Pain Descriptors / Indicators  Pressure    Pain Type  Chronic pain    Pain Frequency  Intermittent                       [facility] Adult PT Treatment/Exercise - 07/18/19 1017      Exercises   Exercises  Lumbar      Lumbar Exercises: Aerobic   Recumbent Bike  L1 x 3 min   difficulty keeping feet on pedals   Nustep  L4 x 4 min (LE only)      Lumbar Exercises: Supine   Clam  15 reps;3 seconds    Clam Limitations  hooklying alt hip ABD/ER with red TB    Bent Knee Raise  10 reps;3 seconds    Bent Knee Raise Limitations  brace marching with red TB      Lumbar Exercises: Sidelying   Clam  Right;Left;10 reps;3 seconds    Clam Limitations  red TB      Knee/Hip Exercises: Standing   Hip Flexion  Right;Left;10 reps;Knee straight;Stengthening    Hip Flexion Limitations  looped red TB at ankles; light UE support on back of chair    Hip Abduction  Right;Left;10 reps;Knee straight;Stengthening    Abduction Limitations  looped red TB at ankles; light UE support on back of chair    Hip Extension  Right;Left;10 reps;Knee straight;Stengthening    Extension Limitations  looped red TB at ankles; light UE support on back of chair      Manual Therapy   Manual Therapy  Taping     Kinesiotex  Inhibit Muscle      Kinesiotix   Inhibit Muscle   30% along B thoracolumbar parapsinals               PT Short Term Goals - 07/08/19 1023      PT SHORT TERM GOAL #1   Title  Patient will be independent with initial HEP    Status  Achieved      PT SHORT TERM GOAL #2  Title  Patient will verbalize/demonstrate understanding of neutral spine posture and proper body mechanics to reduce strain on thoracolumbar spine    Status  Achieved        PT Long Term Goals - 06/27/19 1112      PT LONG TERM GOAL #1   Title  Patient will be independent with ongoing/advanced HEP    Status  On-going      PT LONG TERM GOAL #2   Title  Patient to demonstrate appropriate posture and body mechanics needed for daily activities    Status  On-going      PT LONG TERM GOAL #3   Title  B hip and knee strength grossly >/= 4+/5 for improved stability    Status  On-going      PT LONG TERM GOAL #4   Title  Patient to report ability to perform ADLs, household and yardwork-related tasks within limits of MD restrictions without increased pain >/= 3/10    Status  On-going      PT LONG TERM GOAL #5   Status  On-going            Plan - 07/18/19 1046    Clinical Impression Statement  Luis Brown reporting his surgeon found loosening of his thoracic fusion hardware with broken pedicle screw at his follow up visit yesterday and wants him to defer UE strengthening at present, therefore shifted focus of exercises to core and LE strengthening today. Patient reporting good tolerance with no increased pain. He still is bothered by intermittent muscle spasms and noted benefit from prior kinesiotaping, therefore reapplied today.    Personal Factors and Comorbidities  Age;Comorbidity 3+;Past/Current Experience;Time since onset of injury/illness/exacerbation    Comorbidities  osteomyelitis; peripheral neuropathy/B foot/LE numbness; HTN; R RTC repair 2010; additional PMHx as above    Examination-Activity  Limitations  Bed Mobility;Bend;Caring for Others;Carry;Lift;Locomotion Level;Reach Overhead;Sit;Squat;Stairs;Stand    Examination-Participation Restrictions  Cleaning;Driving;Laundry;Yard Work;Community Activity    Rehab Potential  Good    PT Frequency  2x / week    PT Duration  8 weeks    PT Treatment/Interventions  ADLs/Self Care Home Management;Cryotherapy;Electrical Stimulation;Moist Heat;Gait training;Stair training;Functional mobility training;Therapeutic activities;Therapeutic exercise;Balance training;Neuromuscular re-education;Patient/family education;Manual techniques;Scar mobilization;Passive range of motion;Dry needling;Taping    PT Next Visit Plan  Core and LE strengthening; Thoracic extension exercises; Manual therapy & modalities PRN    PT Home Exercise Plan  06/19/19 - HS stretch, abdominal bracing + brace marching & bent knee fall-outs; 06/27/19 - seated modified piriformis stretch with LE resting on table; 07/08/19 - red TB added to hooklying clams & march, red TB scap retraction + horiz ABD, UE diagonals, rows & low rows    Consulted and Agree with Plan of Care  Patient       Patient will benefit from skilled therapeutic intervention in order to improve the following deficits and impairments:  Abnormal gait, Decreased activity tolerance, Decreased balance, Decreased endurance, Decreased knowledge of precautions, Decreased mobility, Decreased range of motion, Decreased safety awareness, Decreased strength, Difficulty walking, Increased muscle spasms, Impaired perceived functional ability, Impaired flexibility, Impaired sensation, Improper body mechanics, Postural dysfunction, Pain  Visit Diagnosis: Pain in thoracic spine  Chronic right-sided low back pain without sciatica  Radiculopathy, thoracolumbar region  Muscle weakness (generalized)  Difficulty in walking, not elsewhere classified     Problem List Patient Active Problem List   Diagnosis Date Noted  . Alcohol  abuse 05/09/2019  . Hyponatremia 05/05/2019  . 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[place]) 04/04/2019  .  Fusion of spine of thoracolumbar region 03/12/2019  . Status post thoracic spinal fusion 02/12/2019  . 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 Thoracic spinal stenosis 04/17/2017  . Painful orthopaedic hardware ([place]) 04/17/2017  . 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 ([place]) 04/17/2017  . Low back pain radiating to both legs 01/16/2017  . Esophageal reflux 02/10/2015  . Medicare annual wellness visit, subsequent 02/10/2015  . Personal history of colonic polyps 10/27/2012  . 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  . BPH (benign prostatic hyperplasia) 04/23/2012  . ED (erectile dysfunction) 04/23/2012  . Allergic state 10/25/2011  . Anxiety and depression 10/25/2011  . Asthma   . Diabetes mellitus without complication ([place])   . Hyperlipidemia   . Overweight   . Insomnia   . Fatigue   . Preventative health care   . DDD (degenerative disc disease), lumbosacral   . Thoracic degenerative disc disease   . HTN (hypertension)     Percival Spanish, PT, MPT 07/18/2019, 11:01 AM  [facility] [address]  [place], [place], [address] Phone: [phone]   Fax:  [phone]  Name: Luis Brown MRN: [id number] Date of Birth: [birth year]   PHYSICAL THERAPY DISCHARGE SUMMARY  Visits from Start of Care: 5  Current functional level related to goals / functional outcomes:   Refer to above clinical impression for status as of last visit on 07/18/2019. Patient cancelled all remaining appointments at MD instruction and has not returned to PT in >30 days, therefore will proceed with discharge from PT for this episode.   Remaining deficits:   As above.   Education / Equipment:   HEP  Plan: Patient agrees to discharge.  Patient goals were  partially met. Patient is being discharged due to the physician's request.  ?????     Percival Spanish, PT, MPT 08/20/19, 9:15 AM  [facility] [address]  [place], [place], [address] Phone: [phone]   Fax:  [phone]

## 2019-07-21 ENCOUNTER — Encounter: Payer: Self-pay | Admitting: Specialist

## 2019-07-21 ENCOUNTER — Telehealth: Payer: Self-pay | Admitting: Medical

## 2019-07-21 NOTE — Telephone Encounter (Signed)
Could you check with pharmacy. What is the max amount of testosterone I can prescribe. It is a controlled medication technically. Want to know max I can prescribe per month and max number of refills. Not tablet but still controlled. Let me know what they say. Are they just giving him one vial a month and he has to go each month? Want to rx correctly and not inconvenience either.

## 2019-07-22 ENCOUNTER — Other Ambulatory Visit: Payer: Self-pay | Admitting: Radiology

## 2019-07-22 ENCOUNTER — Ambulatory Visit: Payer: Medicare Other

## 2019-07-22 ENCOUNTER — Encounter: Payer: Self-pay | Admitting: Specialist

## 2019-07-22 MED ORDER — HYDROCODONE-ACETAMINOPHEN 5-325 MG PO TABS: 1.0000 | ORAL_TABLET | Freq: Four times a day (QID) | ORAL | 0 refills | Status: DC | PRN

## 2019-07-23 ENCOUNTER — Other Ambulatory Visit: Payer: Self-pay | Admitting: Radiology

## 2019-07-23 ENCOUNTER — Encounter: Payer: Self-pay | Admitting: Specialist

## 2019-07-23 MED ORDER — TIZANIDINE HCL 4 MG PO TABS: 4.0000 mg | ORAL_TABLET | Freq: Four times a day (QID) | ORAL | 0 refills | Status: DC | PRN

## 2019-07-24 NOTE — Telephone Encounter (Signed)
Spoke with patient and he is going to call [facility] where he usually gets, because he states that he usually gets 6 vials.  I advised him that I am unsure of the guidelines for [facility].  He will call back to let know what he figures out.  He did get the 1 vial from [facility].

## 2019-07-25 ENCOUNTER — Encounter: Payer: Self-pay | Admitting: Physical Therapy

## 2019-07-26 ENCOUNTER — Ambulatory Visit
Admission: RE | Admit: 2019-07-26 | Discharge: 2019-07-26 | Disposition: A | Discharge status: Home / Self Care | Payer: Medicare Other | Source: Ambulatory Visit | Attending: Specialist | Admitting: Specialist

## 2019-07-26 ENCOUNTER — Ambulatory Visit: Payer: Medicare Other | Admitting: Specialist

## 2019-07-26 DIAGNOSIS — T84296A Other mechanical complication of internal fixation device of vertebrae, initial encounter: Secondary | ICD-10-CM | POA: Diagnosis not present

## 2019-07-26 DIAGNOSIS — Z981 Arthrodesis status: Secondary | ICD-10-CM

## 2019-07-29 ENCOUNTER — Encounter: Payer: Self-pay | Admitting: Specialist

## 2019-07-29 ENCOUNTER — Other Ambulatory Visit: Payer: Self-pay | Admitting: Radiology

## 2019-07-29 ENCOUNTER — Ambulatory Visit: Payer: Medicare Other | Admitting: Physical Therapy

## 2019-07-30 ENCOUNTER — Other Ambulatory Visit: Payer: Self-pay

## 2019-07-30 ENCOUNTER — Ambulatory Visit (INDEPENDENT_AMBULATORY_CARE_PROVIDER_SITE_OTHER): Payer: Medicare Other

## 2019-07-30 DIAGNOSIS — R7989 Other specified abnormal findings of blood chemistry: Secondary | ICD-10-CM | POA: Diagnosis not present

## 2019-07-30 MED ORDER — HYDROCODONE-ACETAMINOPHEN 5-325 MG PO TABS: 1.0000 | ORAL_TABLET | Freq: Four times a day (QID) | ORAL | 0 refills | Status: DC | PRN

## 2019-07-30 MED ORDER — TESTOSTERONE CYPIONATE 200 MG/ML IM SOLN
200.0000 mg | Freq: Once | INTRAMUSCULAR | Status: AC
  Administered 2019-07-30: 200 mg via INTRAMUSCULAR

## 2019-07-30 NOTE — Progress Notes (Addendum)
Patient is here for monthly testosterone injection. Patient tolerated well.   Agree with administration of testosterone for low T.  Mackie Pai, PA-C

## 2019-07-31 ENCOUNTER — Other Ambulatory Visit: Payer: Self-pay | Admitting: Medical

## 2019-07-31 ENCOUNTER — Other Ambulatory Visit: Payer: Self-pay | Admitting: Radiology

## 2019-07-31 ENCOUNTER — Encounter: Payer: Self-pay | Admitting: Specialist

## 2019-07-31 NOTE — Addendum Note (Signed)
Addended by: Minda Ditto, Geoffery Spruce on: 07/31/2019 04:29 PM   Modules accepted: Orders

## 2019-07-31 NOTE — Telephone Encounter (Signed)
Patient needs refill of tizanidine

## 2019-08-01 MED ORDER — TIZANIDINE HCL 4 MG PO TABS: 4.0000 mg | ORAL_TABLET | Freq: Four times a day (QID) | ORAL | 0 refills | Status: DC | PRN

## 2019-08-02 ENCOUNTER — Encounter: Payer: Self-pay | Admitting: Specialist

## 2019-08-02 ENCOUNTER — Other Ambulatory Visit: Payer: Self-pay

## 2019-08-02 ENCOUNTER — Ambulatory Visit (INDEPENDENT_AMBULATORY_CARE_PROVIDER_SITE_OTHER): Payer: Medicare Other | Admitting: Specialist

## 2019-08-02 VITALS — BP 122/71 | HR 76 | Ht 70.0 in | Wt 198.0 lb

## 2019-08-02 DIAGNOSIS — M4325 Fusion of spine, thoracolumbar region: Secondary | ICD-10-CM

## 2019-08-02 DIAGNOSIS — T84498A Other mechanical complication of other internal orthopedic devices, implants and grafts, initial encounter: Secondary | ICD-10-CM | POA: Diagnosis not present

## 2019-08-02 DIAGNOSIS — Z981 Arthrodesis status: Secondary | ICD-10-CM | POA: Diagnosis not present

## 2019-08-02 MED ORDER — IBUPROFEN 600 MG PO TABS: 600.0000 mg | ORAL_TABLET | Freq: Three times a day (TID) | ORAL | 0 refills | Status: DC | PRN

## 2019-08-02 NOTE — Patient Instructions (Addendum)
Avoid frequent bending and stooping  No lifting greater than 10 lbs. May use ice or moist heat for pain. Weight loss is of benefit. Take motrin 600 mg up to three times per day for pain. Vitamin D3  4,000 IU per day. Exercise is important to improve your indurance and does allow people to function better inspite of back pain. Brace intermittantly when pain is increased.

## 2019-08-02 NOTE — Progress Notes (Signed)
Office Visit Note   Patient: Luis Brown           Date of Birth: June 17, 1946           MRN: [id number] Visit Date: 08/02/2019              Requested by: Mackie Pai, PA-C [place],  [place] [address] PCP: Mackie Pai, PA-C   Assessment & Plan: Visit Diagnoses:  1. 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 Fusion of spine of thoracolumbar region   3. Arthrodesis status   4. Status post thoracic spinal fusion     Plan: Avoid frequent bending and stooping  No lifting greater than 10 lbs. May use ice or moist heat for pain. Weight loss is of benefit. Take motrin 600 mg up to three times per day for pain. Exercise is important to improve your indurance and does allow people to function better inspite of back pain. Brace intermittantly when pain is increased. Vitamin D3  4,000 IU per day. Follow-Up Instructions: Return in about 4 weeks (around 08/30/2019).   Orders:  No orders of the defined types were placed in this encounter.  Meds ordered this encounter  Medications  . ibuprofen (ADVIL) 600 MG tablet    Sig: Take 1 tablet (600 mg total) by mouth every 8 (eight) hours as needed for mild pain or moderate pain.    Dispense:  90 tablet    Refill:  0      Procedures: No procedures performed   Clinical Data: No additional findings.   Subjective: Chief Complaint  Patient presents with  . Spine - Follow-up    CT Scan review of Thoracic spine w/o contrast    HPI  Review of Systems   Objective: Vital Signs: BP 122/71 (BP Location: Left Arm, Patient Position: Sitting)   Pulse 76   Ht 5\' 10"  (1.778 m)   Wt 198 lb (89.8 kg)   BMI 28.41 kg/m   Physical Exam  Ortho Exam  Specialty Comments:  No specialty comments available.  Imaging: No results found.   PMFS History: Patient Active Problem List   Diagnosis Date Noted  . Alcohol abuse 05/09/2019  . Hyponatremia 05/05/2019  . Subacute osteomyelitis of thoracic  spine ([place]) 04/04/2019  . Fusion of spine of thoracolumbar region 03/12/2019  . Status post thoracic spinal fusion 02/12/2019  . 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 Thoracic spinal stenosis 04/17/2017  . Painful orthopaedic hardware ([place]) 04/17/2017  . 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 Low back pain radiating to both legs 01/16/2017  . Esophageal reflux 02/10/2015  . Medicare annual wellness visit, subsequent 02/10/2015  . Personal history of colonic polyps 10/27/2012  . 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  . BPH (benign prostatic hyperplasia) 04/23/2012  . ED (erectile dysfunction) 04/23/2012  . Allergic state 10/25/2011  . Anxiety and depression 10/25/2011  . Asthma   . Diabetes mellitus without complication ([place])   . Hyperlipidemia   . Overweight   . Insomnia   . Fatigue   . Preventative health care   . DDD (degenerative disc disease), lumbosacral   . Thoracic degenerative disc disease   . HTN (hypertension)    Past Medical History:  Diagnosis Date  . Acute pharyngitis 10/21/2013  . Allergy    grass and pollen  . Anxiety and depression 10/25/2011  . BPH (benign prostatic  hyperplasia) 04/23/2012  . Chicken pox as a child  . DDD (degenerative disc disease)    cervical responds to steroid injections and low back required surgery  . DDD (degenerative disc disease), lumbosacral   . Diabetes mellitus    pre  . Dyspnea   . ED (erectile dysfunction) 04/23/2012  . Elevated BP   . Esophageal reflux 02/10/2015  . Fatigue   . HTN (hypertension)   . Hyperglycemia    preDM   . Hyperlipidemia   . Insomnia   . Low back pain radiating to both legs 01/16/2017  . Measles as a child  . Medicare annual wellness visit, subsequent 02/10/2015  . Overweight(278.02)   . Personal history of colonic polyps 10/27/2012   Follows with [facility]  . Pneumonia    " walking"    . Preventative health care   . 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  . Wears glasses     Family History  Problem Relation Age of Onset  . Hypertension Mother   . Diabetes Mother        type 2  . Cancer Mother 54       breast in remission  . Emphysema Father        smoker  . COPD Father        smoker  . Stroke Father 26       mini  . Heart disease Father   . Hypertension Sister   . Hyperlipidemia Sister   . Scoliosis Sister   . Osteoporosis Sister   . Hypertension Maternal Grandmother   . Scoliosis Maternal Grandmother   . Heart disease Maternal Grandfather   . Heart disease Paternal Grandfather        smoker  . Heart disease Daughter     Past Surgical History:  Procedure Laterality Date  . BACK SURGERY  2012 and 1994   Dr Margret Chance, screws and cage in low back  . EYE SURGERY Bilateral    2016  . HARDWARE REVISION  03/12/2019   REVISION OF HARDWARE THORACIC TEN-THORACIC ELEVEN WITH APPLICATION OF ADDITIONAL RODS AND ROD SLEEVES THORACIC EIGHT-THORACIC TWELVE 12-LUMBAR ONE (N/A )  . LAMINECTOMY  02/12/2019   DECOMPRESSIVE LAMINECTOMY THORACIC NINE-THORACIC TEN AND THORACIC TEN-THORACIC ELEVEN, EXTENSION OF THORACOLUMBAR FUSION FROM THORACIC TEN TO THORACIC FIVE, LOCAL BONE GRAFT, ALLOGRAFT AND VIVIGEN (N/A)  . SPINAL FUSION N/A 02/12/2019   Procedure: DECOMPRESSIVE LAMINECTOMY THORACIC NINE-THORACIC TEN AND THORACIC TEN-THORACIC ELEVEN, EXTENSION OF THORACOLUMBAR FUSION FROM THORACIC TEN TO THORACIC FIVE, LOCAL BONE GRAFT, ALLOGRAFT AND VIVIGEN;  Surgeon: Jessy Oto, MD;  Location: [facility];  Service: Orthopedics;  Laterality: N/A;  DECOMPRESSIVE LAMINECTOMY THORACIC NINE-THORACIC TEN AND THORACIC TEN-THORACIC ELEVEN, EXTENSION OF TH  . SPINAL FUSION N/A 03/12/2019   Procedure: REVISION OF HARDWARE THORACIC TEN-THORACIC ELEVEN WITH APPLICATION OF ADDITIONAL RODS AND ROD SLEEVES THORACIC EIGHT-THORACIC TWELVE 12-LUMBAR ONE;  Surgeon: Jessy Oto, MD;  Location: [facility];  Service:  Orthopedics;  Laterality: N/A;  . TONSILLECTOMY    . TONSILLECTOMY     as child  . torn rotator cuff  2010   right   Social History   Occupational History  . Not on file  Tobacco Use  . Smoking status: Former Smoker    Packs/day: 1.00    Years: 20.00    Pack years: 20.00    Types: Cigarettes    Quit date: 07/18/1989    Years since quitting: 30.0  . Smokeless tobacco: Never Used  . Tobacco comment: off and  on smoking for the 20 yrs  Substance and Sexual Activity  . Alcohol use: Yes    Comment: drink or two a day- mixed drink  . Drug use: No  . Sexual activity: Yes    Comment: lives with wife, still working, no dietary restrictions, continues to exercise intermittently

## 2019-08-04 ENCOUNTER — Encounter: Payer: Self-pay | Admitting: Specialist

## 2019-08-05 ENCOUNTER — Other Ambulatory Visit: Payer: Self-pay | Admitting: Radiology

## 2019-08-05 ENCOUNTER — Ambulatory Visit: Payer: Medicare Other | Admitting: Physical Therapy

## 2019-08-05 MED ORDER — HYDROCODONE-ACETAMINOPHEN 5-325 MG PO TABS: 1.0000 | ORAL_TABLET | Freq: Four times a day (QID) | ORAL | 0 refills | Status: DC | PRN

## 2019-08-05 NOTE — Telephone Encounter (Signed)
Hydrocodone refill request.  

## 2019-08-07 ENCOUNTER — Telehealth: Payer: Self-pay | Admitting: Medical

## 2019-08-07 ENCOUNTER — Ambulatory Visit: Payer: Medicare Other | Admitting: Neurology

## 2019-08-07 ENCOUNTER — Other Ambulatory Visit: Payer: Self-pay

## 2019-08-07 ENCOUNTER — Encounter: Payer: Self-pay | Admitting: Neurology

## 2019-08-07 ENCOUNTER — Ambulatory Visit (INDEPENDENT_AMBULATORY_CARE_PROVIDER_SITE_OTHER): Payer: Medicare Other | Admitting: Neurology

## 2019-08-07 DIAGNOSIS — G629 Polyneuropathy, unspecified: Secondary | ICD-10-CM

## 2019-08-07 DIAGNOSIS — G59 Mononeuropathy in diseases classified elsewhere: Secondary | ICD-10-CM

## 2019-08-07 DIAGNOSIS — R202 Paresthesia of skin: Secondary | ICD-10-CM | POA: Diagnosis not present

## 2019-08-07 HISTORY — DX: Polyneuropathy, unspecified: G62.9

## 2019-08-07 NOTE — Procedures (Signed)
     HISTORY: Luis Brown is a 74 year old gentleman with a history of diabetes and a history of alcohol abuse.  The patient has had multiple lumbosacral spine procedures, he has some numbness up to the knee on the left leg, numbness in both feet.  He is being evaluated for possible neuropathy or lumbosacral radiculopathy.   NERVE CONDUCTION STUDIES:  Nerve conduction studies were performed on both lower extremities.  No response was seen for the right peroneal nerve, the distal motor latency for the left peroneal nerve was normal but with a low motor amplitude.  The distal motor latencies for the posterior tibial nerves were prolonged and with low motor amplitudes bilaterally.  Slowing was seen for the left peroneal nerve and for the posterior tibial nerves bilaterally.  The sural and peroneal sensory latencies were prolonged bilaterally.  The F-wave latencies for the posterior tibial nerves were prolonged bilaterally.  EMG STUDIES:  EMG study was performed on the left lower extremity:  The tibialis anterior muscle reveals 2 to 4K motor units with slightly decreased recruitment. No fibrillations or positive waves were seen. The peroneus tertius muscle reveals 2 to 5K motor units with decreased recruitment. No fibrillations or positive waves were seen. The medial gastrocnemius muscle reveals 1 to 3K motor units with significantly decreased recruitment. 2+ fibrillations and positive waves were seen. The vastus lateralis muscle reveals 2 to 4K motor units with full recruitment. No fibrillations or positive waves were seen. The iliopsoas muscle reveals 2 to 4K motor units with full recruitment. No fibrillations or positive waves were seen. The biceps femoris muscle (long head) reveals 2 to 5K motor units with decreased recruitment. No fibrillations or positive waves were seen. The lumbosacral paraspinal muscles were tested at 3 levels, and revealed minimal evidence of electrical activity of muscle  at all 3 levels tested. There was good relaxation.   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  Jill Alexanders MD 08/07/2019 2:49 PM  [facility] [address] [place], [place] [address]  Phone [phone] Fax [phone]

## 2019-08-07 NOTE — Progress Notes (Addendum)
The patient comes in today for EMG nerve conduction study evaluation.  Nerve conductions show evidence of a peripheral neuropathy of moderate to severe severity.  The EMG of the left leg shows some findings that could be consistent with an overlying L5/S1 radiculopathy associated with his multiple lumbosacral spine surgeries.  This may explain the asymmetry of numbness in the legs, the left being worse.  The patient does report some gait instability issues associated with neuropathy.  He does have numbness in both feet.     Pepin    Nerve / Sites Muscle Latency Ref. Amplitude Ref. Rel Amp Segments Distance Velocity Ref. Area    ms ms mV mV %  cm m/s m/s mVms  R Peroneal - EDB     Ankle EDB NR ?6.5 NR ?2.0 NR Ankle - EDB 9   NR     Fib head EDB NR  NR  NR Fib head - Ankle 28 NR ?44 NR  L Peroneal - EDB     Ankle EDB 6.1 ?6.5 1.1 ?2.0 100 Ankle - EDB 9   3.6     Fib head EDB 14.0  0.7  61.7 Fib head - Ankle 28 35 ?44 3.2     Pop fossa EDB 16.8  0.6  84.9 Pop fossa - Fib head 10 36 ?44 5.4         Pop fossa - Ankle      R Tibial - AH     Ankle AH 6.3 ?5.8 0.3 ?4.0 100 Ankle - AH 9   1.8     Pop fossa AH 18.3  0.3  126 Pop fossa - Ankle 38 32 ?41 2.2  L Tibial - AH     Ankle AH 6.6 ?5.8 1.2 ?4.0 100 Ankle - AH 9   7.4     Pop fossa AH 16.9  1.2  100 Pop fossa - Ankle 38 37 ?41 5.8             SNC    Nerve / Sites Rec. Site Peak Lat Ref.  Amp Ref. Segments Distance    ms ms V V  cm  R Sural - Ankle (Calf)     Calf Ankle 5.2 ?4.4 4 ?6 Calf - Ankle 14  L Sural - Ankle (Calf)     Calf Ankle 5.3 ?4.4 3 ?6 Calf - Ankle 14  R Superficial peroneal - Ankle     Lat leg Ankle 5.5 ?4.4 3 ?6 Lat leg - Ankle 14  L Superficial peroneal - Ankle     Lat leg Ankle 5.1 ?4.4 3 ?6 Lat leg - Ankle 14              F  Wave    Nerve F Lat Ref.   ms ms  R Tibial - AH 64.4 ?56.0  L Tibial - AH 65.7 ?56.0

## 2019-08-07 NOTE — Progress Notes (Signed)
Please refer to EMG and nerve conduction procedure note.  

## 2019-08-07 NOTE — Telephone Encounter (Signed)
I just reviewed pt emg study. Please disregard that message about calling pt. I reviewed his emg. Looks like it ws already done.

## 2019-08-07 NOTE — Telephone Encounter (Signed)
Neurologist sent me note that he is ordering emg studies. Has he been updated/has that been scheduled? Have him call neurologist office in 10-14 days if that has not been scheduled.

## 2019-08-08 ENCOUNTER — Other Ambulatory Visit: Payer: Self-pay | Admitting: Specialist

## 2019-08-09 MED ORDER — TIZANIDINE HCL 4 MG PO TABS: 4.0000 mg | ORAL_TABLET | Freq: Four times a day (QID) | ORAL | 0 refills | Status: DC | PRN

## 2019-08-09 NOTE — Telephone Encounter (Signed)
JN 

## 2019-08-12 ENCOUNTER — Encounter: Payer: Self-pay | Admitting: Physical Therapy

## 2019-08-13 ENCOUNTER — Other Ambulatory Visit: Payer: Self-pay

## 2019-08-13 ENCOUNTER — Other Ambulatory Visit: Payer: Self-pay | Admitting: Specialist

## 2019-08-13 ENCOUNTER — Ambulatory Visit (INDEPENDENT_AMBULATORY_CARE_PROVIDER_SITE_OTHER): Payer: Medicare Other | Admitting: *Deleted

## 2019-08-13 DIAGNOSIS — R7989 Other specified abnormal findings of blood chemistry: Secondary | ICD-10-CM | POA: Diagnosis not present

## 2019-08-13 MED ORDER — TESTOSTERONE CYPIONATE 200 MG/ML IM SOLN
200.0000 mg | Freq: Once | INTRAMUSCULAR | Status: AC
  Administered 2019-08-13: 200 mg via INTRAMUSCULAR

## 2019-08-13 NOTE — Progress Notes (Signed)
Patient here for testosterone injection per PCP.  Injection given and patient tolerated well.

## 2019-08-14 ENCOUNTER — Other Ambulatory Visit: Payer: Self-pay | Admitting: Specialist

## 2019-08-14 ENCOUNTER — Encounter: Payer: Self-pay | Admitting: Physical Therapy

## 2019-08-14 MED ORDER — HYDROCODONE-ACETAMINOPHEN 5-325 MG PO TABS: 1.0000 | ORAL_TABLET | Freq: Four times a day (QID) | ORAL | 0 refills | Status: DC | PRN

## 2019-08-14 NOTE — Telephone Encounter (Signed)
Done

## 2019-08-14 NOTE — Telephone Encounter (Signed)
Refill request

## 2019-08-15 ENCOUNTER — Other Ambulatory Visit: Payer: Self-pay | Admitting: Specialist

## 2019-08-16 ENCOUNTER — Other Ambulatory Visit: Payer: Self-pay | Admitting: Medical

## 2019-08-16 MED ORDER — TIZANIDINE HCL 4 MG PO TABS: 4.0000 mg | ORAL_TABLET | Freq: Four times a day (QID) | ORAL | 0 refills | Status: DC | PRN

## 2019-08-18 ENCOUNTER — Other Ambulatory Visit: Payer: Self-pay | Admitting: Specialist

## 2019-08-19 ENCOUNTER — Telehealth: Payer: Self-pay

## 2019-08-19 ENCOUNTER — Encounter: Payer: Self-pay | Admitting: Specialist

## 2019-08-19 ENCOUNTER — Other Ambulatory Visit: Payer: Self-pay | Admitting: Specialist

## 2019-08-19 NOTE — Telephone Encounter (Signed)
Yes to refills, at least 3 more months.

## 2019-08-19 NOTE — Telephone Encounter (Signed)
Patient called office today requesting refill on doxy. Is scheduled to follow up with Dr. Linus Salmons on 2/7. Will forward message to MD to advise if refill is okay. Luis Brown

## 2019-08-20 ENCOUNTER — Other Ambulatory Visit: Payer: Self-pay

## 2019-08-20 MED ORDER — HYDROCODONE-ACETAMINOPHEN 5-325 MG PO TABS: 1.0000 | ORAL_TABLET | Freq: Four times a day (QID) | ORAL | 0 refills | Status: DC | PRN

## 2019-08-20 MED ORDER — DOXYCYCLINE HYCLATE 100 MG PO TABS: 100.0000 mg | ORAL_TABLET | Freq: Two times a day (BID) | ORAL | 3 refills | Status: DC

## 2019-08-20 NOTE — Telephone Encounter (Signed)
Nitka pt 

## 2019-08-21 NOTE — Progress Notes (Addendum)
Virtual Visit via audio Note  I connected with patient on 08/22/19 at  2:30 PM EST by audio enabled telemedicine application and verified that I am speaking with the correct person using two identifiers.   THIS ENCOUNTER IS A VIRTUAL VISIT DUE TO COVID-19 - PATIENT WAS NOT SEEN IN THE OFFICE. PATIENT HAS CONSENTED TO VIRTUAL VISIT / TELEMEDICINE VISIT   Location of patient: home  Location of provider: office  I discussed the limitations of evaluation and management by telemedicine and the availability of in person appointments. The patient expressed understanding and agreed to proceed.   Subjective:   Luis Brown is a 74 y.o. male who presents for Medicare Annual/Subsequent preventive examination.  Review of Systems:   Home Safety/Smoke Alarms: Feels safe in home. Smoke alarms in place.  Lives at home w/ wife and 3 dogs.  Male:   CCS-  2014   PSA-  Lab Results  Component Value Date   PSA 0.35 01/22/2019   PSA 0.46 01/25/2018   PSA 0.32 01/16/2017       Objective:    Vitals: Unable to assess. This visit is enabled though telemedicine due to Covid 19.   Advanced Directives 08/22/2019 06/19/2019 03/13/2019 02/21/2019 02/13/2019 02/08/2019 05/24/2017  Does Patient Have a Medical Advance Directive? Yes No Yes Yes Yes Yes Yes  Type of Paramedic of [place];Living will - Healthcare Power of [place] of [place];Living will [place];Living will  Does patient want to make changes to medical advance directive? No - Patient declined - No - Patient declined - No - Patient declined No - Patient declined No - Patient declined  Copy of [place] in Chart? No - copy requested - No - copy requested - No - copy requested No - copy requested -  Would patient like information on creating a medical advance directive? - No - Patient declined - - - - -     Tobacco Social History   Tobacco Use  Smoking Status Former Smoker  . Packs/day: 1.00  . Years: 20.00  . Pack years: 20.00  . Types: Cigarettes  . Quit date: 07/18/1989  . Years since quitting: 30.1  Smokeless Tobacco Never Used  Tobacco Comment   off and on smoking for the 20 yrs     Counseling given: Not Answered Comment: off and on smoking for the 20 yrs   Clinical Intake: Pain : No/denies pain     Past Medical History:  Diagnosis Date  . Acute pharyngitis 10/21/2013  . Allergy    grass and pollen  . Anxiety and depression 10/25/2011  . BPH (benign prostatic hyperplasia) 04/23/2012  . Chicken pox as a child  . DDD (degenerative disc disease)    cervical responds to steroid injections and low back required surgery  . DDD (degenerative disc disease), lumbosacral   . Diabetes mellitus    pre  . Dyspnea   . ED (erectile dysfunction) 04/23/2012  . Elevated BP   . Esophageal reflux 02/10/2015  . Fatigue   . HTN (hypertension)   . Hyperglycemia    preDM   . Hyperlipidemia   . Insomnia   . Low back pain radiating to both legs 01/16/2017  . Measles as a child  . Medicare annual wellness visit, subsequent 02/10/2015  . Overweight(278.02)   . Peripheral neuropathy 08/07/2019  . Personal history of colonic polyps 10/27/2012   [facility]  . Pneumonia    " walking"  . Preventative health care   . 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  . Wears glasses    Past Surgical History:  Procedure Laterality Date  . BACK SURGERY  2012 and 1994   Dr Margret Chance, screws and cage in low back  . EYE SURGERY Bilateral    2016  . HARDWARE REVISION  03/12/2019   REVISION OF HARDWARE THORACIC TEN-THORACIC ELEVEN WITH APPLICATION OF ADDITIONAL RODS AND ROD SLEEVES THORACIC EIGHT-THORACIC TWELVE 12-LUMBAR ONE (N/A )  . LAMINECTOMY  02/12/2019   DECOMPRESSIVE LAMINECTOMY THORACIC NINE-THORACIC TEN AND THORACIC TEN-THORACIC ELEVEN, EXTENSION OF THORACOLUMBAR FUSION FROM  THORACIC TEN TO THORACIC FIVE, LOCAL BONE GRAFT, ALLOGRAFT AND VIVIGEN (N/A)  . SPINAL FUSION N/A 02/12/2019   Procedure: DECOMPRESSIVE LAMINECTOMY THORACIC NINE-THORACIC TEN AND THORACIC TEN-THORACIC ELEVEN, EXTENSION OF THORACOLUMBAR FUSION FROM THORACIC TEN TO THORACIC FIVE, LOCAL BONE GRAFT, ALLOGRAFT AND VIVIGEN;  Surgeon: Jessy Oto, MD;  Location: [facility];  Service: Orthopedics;  Laterality: N/A;  DECOMPRESSIVE LAMINECTOMY THORACIC NINE-THORACIC TEN AND THORACIC TEN-THORACIC ELEVEN, EXTENSION OF TH  . SPINAL FUSION N/A 03/12/2019   Procedure: REVISION OF HARDWARE THORACIC TEN-THORACIC ELEVEN WITH APPLICATION OF ADDITIONAL RODS AND ROD SLEEVES THORACIC EIGHT-THORACIC TWELVE 12-LUMBAR ONE;  Surgeon: Jessy Oto, MD;  Location: [facility];  Service: Orthopedics;  Laterality: N/A;  . TONSILLECTOMY    . TONSILLECTOMY     as child  . torn rotator cuff  2010   right   Family History  Problem Relation Age of Onset  . Hypertension Mother   . Diabetes Mother        type 2  . Cancer Mother 82       breast in remission  . Emphysema Father        smoker  . COPD Father        smoker  . Stroke Father 58       mini  . Heart disease Father   . Hypertension Sister   . Hyperlipidemia Sister   . Scoliosis Sister   . Osteoporosis Sister   . Hypertension Maternal Grandmother   . Scoliosis Maternal Grandmother   . Heart disease Maternal Grandfather   . Heart disease Paternal Grandfather        smoker  . Heart disease Daughter    Social History   Socioeconomic History  . Marital status: Married    Spouse name: Not on file  . Number of children: Not on file  . Years of education: Not on file  . Highest education level: Not on file  Occupational History  . Not on file  Tobacco Use  . Smoking status: Former Smoker    Packs/day: 1.00    Years: 20.00    Pack years: 20.00    Types: Cigarettes    Quit date: 07/18/1989    Years since quitting: 30.1  . Smokeless tobacco: Never Used  .  Tobacco comment: off and on smoking for the 20 yrs  Substance and Sexual Activity  . Alcohol use: Yes    Comment: drink or two a day- mixed drink  . Drug use: No  . Sexual activity: Yes    Comment: lives with wife, still working, no dietary restrictions, continues to exercise intermittently  Other Topics Concern  . Not on file  Social History Narrative  . Not on file   Social Determinants of Health   Financial Resource Strain: Low Risk   . Difficulty of Paying Living Expenses: Not  hard at all  Food Insecurity:   . Worried About Charity fundraiser in the Last Year: Not on file  . Ran Out of Food in the Last Year: Not on file  Transportation Needs: No Transportation Needs  . Lack of Transportation (Medical): No  . Lack of Transportation (Non-Medical): No  Physical Activity:   . Days of Exercise per Week: Not on file  . Minutes of Exercise per Session: Not on file  Stress:   . Feeling of Stress : Not on file  Social Connections:   . Frequency of Communication with Friends and Family: Not on file  . Frequency of Social Gatherings with Friends and Family: Not on file  . Attends Religious Services: Not on file  . Active Member of Clubs or Organizations: Not on file  . Attends Archivist Meetings: Not on file  . Marital Status: Not on file    Outpatient Encounter Medications as of 08/22/2019  Medication Sig  . atorvastatin (LIPITOR) 20 MG tablet TAKE 1/2 TABLET BY MOUTH DAILY AT 6 PM  . busPIRone (BUSPAR) 7.5 MG tablet 1-2 TAB BY MOUTH 3 TIMES A DAY FOR ANXIETY  . cetirizine (ZYRTEC) 10 MG tablet Take 10 mg by mouth daily. In the morning  . doxycycline (VIBRA-TABS) 100 MG tablet Take 1 tablet (100 mg total) by mouth 2 (two) times daily.  . famotidine (PEPCID) 40 MG tablet TAKE 1 TABLET BY MOUTH EVERYDAY AT BEDTIME  . FERRETTS 325 (106 Fe) MG TABS tablet TAKE 1 TABLET (106 MG OF IRON TOTAL) BY MOUTH DAILY.  Marland Kitchen gabapentin (NEURONTIN) 300 MG capsule Take 1 capsule (300 mg  total) by mouth 3 (three) times daily.  Marland Kitchen glucose blood (CONTOUR NEXT TEST) test strip Check blood sugar once daily  . HYDROcodone-acetaminophen (NORCO/VICODIN) 5-325 MG tablet Take 1 tablet by mouth every 6 (six) hours as needed for moderate pain.  Marland Kitchen ibuprofen (ADVIL) 600 MG tablet Take 1 tablet (600 mg total) by mouth every 8 (eight) hours as needed for mild pain or moderate pain.  Marland Kitchen lisinopril (ZESTRIL) 5 MG tablet TAKE 1 TABLET BY MOUTH TWICE A DAY  . metFORMIN (GLUCOPHAGE) 500 MG tablet Take 1 tablet (500 mg total) by mouth 2 (two) times daily with a meal.  . tamsulosin (FLOMAX) 0.4 MG CAPS capsule TAKE TWO CAPSULES BY MOUTH DAILY   . testosterone cypionate (DEPOTESTOSTERONE CYPIONATE) 200 MG/ML injection Inject 1 mL (200 mg total) into the muscle every 14 (fourteen) days.  Marland Kitchen tiZANidine (ZANAFLEX) 4 MG tablet TAKE 1 TABLET (4 MG TOTAL) BY MOUTH EVERY 6 (SIX) HOURS AS NEEDED FOR MUSCLE SPASMS.  Marland Kitchen venlafaxine XR (EFFEXOR-XR) 150 MG 24 hr capsule TAKE ONE CAPSULE BY MOUTH IN THE MORNING with breakfast  . diazepam (VALIUM) 10 MG tablet TAKE 1 TABLET(10 MG) BY MOUTH AT BEDTIME AS NEEDED (Patient not taking: Reported on 08/22/2019)   Facility-Administered Encounter Medications as of 08/22/2019  Medication  . cefTRIAXone (ROCEPHIN) 2 g in dextrose 5 % 50 mL IVPB  . testosterone cypionate (DEPOTESTOSTERONE CYPIONATE) injection 200 mg    Activities of Daily Living In your present state of health, do you have any difficulty performing the following activities: 08/22/2019  Hearing? N  Vision? N  Difficulty concentrating or making decisions? N  Walking or climbing stairs? N  Dressing or bathing? N  Doing errands, shopping? N  Preparing Food and eating ? N  Using the Toilet? N  In the past six months, have you accidently leaked  urine? N  Do you have problems with loss of bowel control? N  Managing your Medications? N  Managing your Finances? N  Housekeeping or managing your Housekeeping? N  Some  recent data might be hidden    Patient Care Team: Saguier, Iris Pert as PCP - General (Internal Medicine) Keene Breath., MD (Ophthalmology) Jessy Oto, MD as Consulting Physician (Orthopedic Surgery)   Assessment:   This is a routine wellness examination for [place]. Physical assessment deferred to PCP.  Exercise Activities and Dietary recommendations Current Exercise Habits: Home exercise routine, Type of exercise: walking, Time (Minutes): 30, Frequency (Times/Week): 3, Weekly Exercise (Minutes/Week): 90, Intensity: Mild, Exercise limited by: None identified   Diet (meal preparation, eat out, water intake, caffeinated beverages, dairy products, fruits and vegetables): well balanced   Goals    . I would like for my back to be better so I can be more active.    . Weight (lb) < 190 lb (86.2 kg)       Fall Risk Fall Risk  08/22/2019 05/29/2019 05/08/2019 04/04/2019 08/23/2016  Falls in the past year? 0 0 0 0 Yes  Number falls in past yr: 0 0 - 0 -  Injury with Fall? 0 0 - 0 -  Follow up Education provided;Falls prevention discussed - - - -   Depression Screen PHQ 2/9 Scores 08/22/2019 05/29/2019 05/08/2019 04/04/2019  PHQ - 2 Score 0 0 0 0    Cognitive Function Ad8 score reviewed for issues:  Issues making decisions:no  Less interest in hobbies / activities: no  Repeats questions, stories (family complaining):no  Trouble using ordinary gadgets (microwave, computer, phone):no  Forgets the month or year: no  Mismanaging finances:  no  Remembering appts:no  Daily problems with thinking and/or memory:no Ad8 score is=0     MMSE - Mini Mental State Exam 08/23/2016  Orientation to time 5  Orientation to Place 5  Registration 3  Attention/ Calculation 5  Recall 2  Language- name 2 objects 2  Language- repeat 1  Language- follow 3 step command 3  Language- read & follow direction 1  Write a sentence 1  Copy design 1  Total score 29        Immunization  History  Administered Date(s) Administered  . Fluad Quad(high Dose 65+) 04/05/2019  . Influenza Split 04/23/2012  . Influenza Whole 02/16/2011  . Influenza, High Dose Seasonal PF 05/06/2013, 03/25/2016, 05/05/2017, 06/07/2018  . Pneumococcal Conjugate-13 03/25/2016  . Pneumococcal Polysaccharide-23 10/19/2011  . Td 07/18/2008  . Zoster 04/27/2016   Screening Tests Health Maintenance  Topic Date Due  . OPHTHALMOLOGY EXAM  04/17/2017  . FOOT EXAM  08/23/2017  . TETANUS/TDAP  07/18/2018  . HEMOGLOBIN A1C  09/12/2019  . COLONOSCOPY  09/05/2022  . INFLUENZA VACCINE  Completed  . Hepatitis C Screening  Completed  . PNA vac Low Risk Adult  Completed     Plan:    Please schedule your next medicare wellness visit with me in 1 yr.  Continue to eat heart healthy diet (full of fruits, vegetables, whole grains, lean protein, water--limit salt, fat, and sugar intake) and increase physical activity as tolerated.  Continue doing brain stimulating activities (puzzles, reading, adult coloring books, staying active) to keep memory sharp.   Bring a copy of your living will and/or healthcare power of attorney to your next office visit.   I have personally reviewed and noted the following in the patient's chart:   . Medical and social history .  Use of alcohol, tobacco or illicit drugs  . Current medications and supplements . Functional ability and status . Nutritional status . Physical activity . Advanced directives . List of other physicians . Hospitalizations, surgeries, and ER visits in previous 12 months . Vitals . Screenings to include cognitive, depression, and falls . Referrals and appointments  In addition, I have reviewed and discussed with patient certain preventive protocols, quality metrics, and best practice recommendations. A written personalized care plan for preventive services as well as general preventive health recommendations were provided to patient.     Shela Nevin, [place]  08/22/2019  Reviewed and agree with assessment & plan or RN.  Mackie Pai, PA-C

## 2019-08-22 ENCOUNTER — Encounter: Payer: Self-pay | Admitting: *Deleted

## 2019-08-22 ENCOUNTER — Ambulatory Visit (INDEPENDENT_AMBULATORY_CARE_PROVIDER_SITE_OTHER): Payer: Medicare Other | Admitting: *Deleted

## 2019-08-22 ENCOUNTER — Other Ambulatory Visit: Payer: Self-pay

## 2019-08-22 DIAGNOSIS — Z0000 Encounter for general adult medical examination without abnormal findings: Secondary | ICD-10-CM

## 2019-08-22 NOTE — Patient Instructions (Signed)
Please schedule your next medicare wellness visit with me in 1 yr.  Continue to eat heart healthy diet (full of fruits, vegetables, whole grains, lean protein, water--limit salt, fat, and sugar intake) and increase physical activity as tolerated.  Continue doing brain stimulating activities (puzzles, reading, adult coloring books, staying active) to keep memory sharp.   Bring a copy of your living will and/or healthcare power of attorney to your next office visit.   Mr. Nylund , Thank you for taking time to come for your Medicare Wellness Visit. I appreciate your ongoing commitment to your health goals. Please review the following plan we discussed and let me know if I can assist you in the future.   These are the goals we discussed: Goals    . I would like for my back to be better so I can be more active.    . Weight (lb) < 190 lb (86.2 kg)       This is a list of the screening recommended for you and due dates:  Health Maintenance  Topic Date Due  . Eye exam for diabetics  04/17/2017  . Complete foot exam   08/23/2017  . Tetanus Vaccine  07/18/2018  . Hemoglobin A1C  09/12/2019  . Colon Cancer Screening  09/05/2022  . Flu Shot  Completed  .  Hepatitis C: One time screening is recommended by Center for Disease Control  (CDC) for  adults born from [birth year] through 1967.   Completed  . Pneumonia vaccines  Completed    Preventive Care 59 Years and Older, Male Preventive care refers to lifestyle choices and visits with your health care provider that can promote health and wellness. This includes:  A yearly physical exam. This is also called an annual well check.  Regular dental and eye exams.  Immunizations.  Screening for certain conditions.  Healthy lifestyle choices, such as diet and exercise. What can I expect for my preventive care visit? Physical exam Your health care provider will check:  Height and weight. These may be used to calculate body mass index (BMI), which is  a measurement that tells if you are at a healthy weight.  Heart rate and blood pressure.  Your skin for abnormal spots. Counseling Your health care provider may ask you questions about:  Alcohol, tobacco, and drug use.  Emotional well-being.  Home and relationship well-being.  Sexual activity.  Eating habits.  History of falls.  Memory and ability to understand (cognition).  Work and work Statistician. What immunizations do I need?  Influenza (flu) vaccine  This is recommended every year. Tetanus, diphtheria, and pertussis (Tdap) vaccine  You may need a Td booster every 10 years. Varicella (chickenpox) vaccine  You may need this vaccine if you have not already been vaccinated. Zoster (shingles) vaccine  You may need this after age 74. Pneumococcal conjugate (PCV13) vaccine  One dose is recommended after age 62. Pneumococcal polysaccharide (PPSV23) vaccine  One dose is recommended after age 65. Measles, mumps, and rubella (MMR) vaccine  You may need at least one dose of MMR if you were born in 1957 or later. You may also need a second dose. Meningococcal conjugate (MenACWY) vaccine  You may need this if you have certain conditions. Hepatitis A vaccine  You may need this if you have certain conditions or if you travel or work in places where you may be exposed to hepatitis A. Hepatitis B vaccine  You may need this if you have certain conditions or if  you travel or work in places where you may be exposed to hepatitis B. Haemophilus influenzae type b (Hib) vaccine  You may need this if you have certain conditions. You may receive vaccines as individual doses or as more than one vaccine together in one shot (combination vaccines). Talk with your health care provider about the risks and benefits of combination vaccines. What tests do I need? Blood tests  Lipid and cholesterol levels. These may be checked every 5 years, or more frequently depending on your  overall health.  Hepatitis C test.  Hepatitis B test. Screening  Lung cancer screening. You may have this screening every year starting at age 34 if you have a 30-pack-year history of smoking and currently smoke or have quit within the past 15 years.  Colorectal cancer screening. All adults should have this screening starting at age 38 and continuing until age 26. Your health care provider may recommend screening at age 38 if you are at increased risk. You will have tests every 1-10 years, depending on your results and the type of screening test.  Prostate cancer screening. Recommendations will vary depending on your family history and other risks.  Diabetes screening. This is done by checking your blood sugar (glucose) after you have not eaten for a while (fasting). You may have this done every 1-3 years.  Abdominal aortic aneurysm (AAA) screening. You may need this if you are a current or former smoker.  Sexually transmitted disease (STD) testing. Follow these instructions at home: Eating and drinking  Eat a diet that includes fresh fruits and vegetables, whole grains, lean protein, and low-fat dairy products. Limit your intake of foods with high amounts of sugar, saturated fats, and salt.  Take vitamin and mineral supplements as recommended by your health care provider.  Do not drink alcohol if your health care provider tells you not to drink.  If you drink alcohol: ? Limit how much you have to 0-2 drinks a day. ? Be aware of how much alcohol is in your drink. In the [place] one drink equals one 12 oz bottle of beer (355 mL), one 5 oz glass of wine (148 mL), or one 1 oz glass of hard liquor (44 mL). Lifestyle  Take daily care of your teeth and gums.  Stay active. Exercise for at least 30 minutes on 5 or more days each week.  Do not use any products that contain nicotine or tobacco, such as cigarettes, e-cigarettes, and chewing tobacco. If you need help quitting, ask your health  care provider.  If you are sexually active, practice safe sex. Use a condom or other form of protection to prevent STIs (sexually transmitted infections).  Talk with your health care provider about taking a low-dose aspirin or statin. What's next?  Visit your health care provider once a year for a well check visit.  Ask your health care provider how often you should have your eyes and teeth checked.  Stay up to date on all vaccines. This information is not intended to replace advice given to you by your health care provider. Make sure you discuss any questions you have with your health care provider. Document Revised: 06/28/2018 Document Reviewed: 06/28/2018 Elsevier Patient Education  2020 [employer].

## 2019-08-26 ENCOUNTER — Encounter: Payer: Self-pay | Admitting: Internal Medicine

## 2019-08-26 ENCOUNTER — Other Ambulatory Visit: Payer: Self-pay | Admitting: Specialist

## 2019-08-26 ENCOUNTER — Other Ambulatory Visit: Payer: Self-pay

## 2019-08-26 ENCOUNTER — Ambulatory Visit: Payer: Medicare Other | Admitting: Internal Medicine

## 2019-08-26 VITALS — BP 144/77 | HR 76 | Temp 97.6°F | Wt 201.0 lb

## 2019-08-26 DIAGNOSIS — Z5181 Encounter for therapeutic drug level monitoring: Secondary | ICD-10-CM | POA: Diagnosis not present

## 2019-08-26 DIAGNOSIS — E871 Hypo-osmolality and hyponatremia: Secondary | ICD-10-CM

## 2019-08-26 DIAGNOSIS — M4624 Osteomyelitis of vertebra, thoracic region: Secondary | ICD-10-CM

## 2019-08-26 NOTE — Assessment & Plan Note (Signed)
Will check a CMP on doxycycline to be sure no issues.

## 2019-08-26 NOTE — Assessment & Plan Note (Signed)
He continues to heal well with no significant concerns.  I will continue with the plan for 6 month of oral doxycline.  I will check his inflammatory markers today as well.  He will return in 3 months and will repeat the inflammatory markers then and plan to stop in mid May.  He can then return 1-2 month later to repeat again after stopping.

## 2019-08-26 NOTE — Progress Notes (Signed)
   Subjective:    [name], male    DOB: 31-Aug-1945, 74 y.o.   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  He has completed 6 weeks of IV ceftriaxone in November and now on consolidative therapy for a projected 6 months with doxycycline through mid May.  He feels well today and no new issues.  For a complete history, see previous note from Dr. Prince Rome.  No associated rash or diarrhea.     Review of Systems  Constitutional: Negative for chills and fever.  Gastrointestinal: Negative for diarrhea.  Skin: Negative for rash.       Objective:   Physical Exam Constitutional:      Appearance: Normal appearance.  Eyes:     General: No scleral icterus. Cardiovascular:     Rate and Rhythm: Normal rate and regular rhythm.  Neurological:     Mental Status: He is alert.  Psychiatric:        Mood and Affect: Mood normal.   SH: previous smoker        Assessment & Plan:

## 2019-08-26 NOTE — Assessment & Plan Note (Signed)
Some mild hyponatremia.  Will recheck today

## 2019-08-27 ENCOUNTER — Other Ambulatory Visit: Payer: Self-pay | Admitting: Specialist

## 2019-08-27 LAB — C-REACTIVE PROTEIN: CRP: 66.9 mg/L — ABNORMAL HIGH (ref <8.0)

## 2019-08-27 LAB — COMPREHENSIVE METABOLIC PANEL
AG Ratio: 1.9 (calc) (ref 1.0–2.5)
ALT: 12 U/L (ref 9–46)
AST: 18 U/L (ref 10–35)
Albumin: 3.7 g/dL (ref 3.6–5.1)
Alkaline phosphatase (APISO): 88 U/L (ref 35–144)
BUN: 16 mg/dL (ref 7–25)
CO2: 31 mmol/L (ref 20–32)
Calcium: 9.2 mg/dL (ref 8.6–10.3)
Chloride: 97 mmol/L — ABNORMAL LOW (ref 98–110)
Creat: 0.75 mg/dL (ref 0.70–1.18)
Globulin: 2 g/dL (calc) (ref 1.9–3.7)
Glucose, Bld: 152 mg/dL — ABNORMAL HIGH (ref 65–99)
Potassium: 4.9 mmol/L (ref 3.5–5.3)
Sodium: 134 mmol/L — ABNORMAL LOW (ref 135–146)
Total Bilirubin: 0.6 mg/dL (ref 0.2–1.2)
Total Protein: 5.7 g/dL — ABNORMAL LOW (ref 6.1–8.1)

## 2019-08-27 LAB — SEDIMENTATION RATE: Sed Rate: 11 mm/h (ref 0–20)

## 2019-08-28 ENCOUNTER — Other Ambulatory Visit: Payer: Self-pay | Admitting: Specialist

## 2019-08-28 ENCOUNTER — Encounter: Payer: Self-pay | Admitting: Specialist

## 2019-08-28 ENCOUNTER — Other Ambulatory Visit: Payer: Self-pay

## 2019-08-28 ENCOUNTER — Ambulatory Visit (INDEPENDENT_AMBULATORY_CARE_PROVIDER_SITE_OTHER): Payer: Medicare Other

## 2019-08-28 DIAGNOSIS — R7989 Other specified abnormal findings of blood chemistry: Secondary | ICD-10-CM | POA: Diagnosis not present

## 2019-08-28 MED ORDER — TESTOSTERONE CYPIONATE 200 MG/ML IM SOLN
200.0000 mg | Freq: Once | INTRAMUSCULAR | Status: AC
  Administered 2019-08-28: 09:00:00 200 mg via INTRAMUSCULAR

## 2019-08-28 NOTE — Progress Notes (Addendum)
Patient in for testosterone injection Tolerated injection well  Pt has hx of low T. Agree with administration.  Mackie Pai, PA-C

## 2019-08-29 ENCOUNTER — Other Ambulatory Visit: Payer: Self-pay | Admitting: Radiology

## 2019-08-29 MED ORDER — HYDROCODONE-ACETAMINOPHEN 5-325 MG PO TABS: 1.0000 | ORAL_TABLET | Freq: Four times a day (QID) | ORAL | 0 refills | Status: DC | PRN

## 2019-09-03 ENCOUNTER — Ambulatory Visit: Payer: Medicare Other

## 2019-09-03 ENCOUNTER — Other Ambulatory Visit: Payer: Self-pay | Admitting: Specialist

## 2019-09-03 NOTE — Telephone Encounter (Signed)
JN pt 

## 2019-09-04 MED ORDER — HYDROCODONE-ACETAMINOPHEN 5-325 MG PO TABS: 1.0000 | ORAL_TABLET | Freq: Four times a day (QID) | ORAL | 0 refills | Status: DC | PRN

## 2019-09-06 ENCOUNTER — Encounter: Payer: Self-pay | Admitting: Specialist

## 2019-09-07 ENCOUNTER — Other Ambulatory Visit: Payer: Self-pay | Admitting: Medical

## 2019-09-09 ENCOUNTER — Other Ambulatory Visit: Payer: Self-pay | Admitting: Specialist

## 2019-09-09 MED ORDER — TIZANIDINE HCL 4 MG PO TABS: 6.0000 mg | ORAL_TABLET | Freq: Four times a day (QID) | ORAL | 1 refills | Status: DC | PRN

## 2019-09-10 ENCOUNTER — Other Ambulatory Visit: Payer: Self-pay | Admitting: Specialist

## 2019-09-11 ENCOUNTER — Encounter: Payer: Self-pay | Admitting: Specialist

## 2019-09-11 ENCOUNTER — Ambulatory Visit (INDEPENDENT_AMBULATORY_CARE_PROVIDER_SITE_OTHER): Payer: Medicare Other

## 2019-09-11 ENCOUNTER — Other Ambulatory Visit: Payer: Self-pay

## 2019-09-11 DIAGNOSIS — R7989 Other specified abnormal findings of blood chemistry: Secondary | ICD-10-CM

## 2019-09-11 MED ORDER — HYDROCODONE-ACETAMINOPHEN 5-325 MG PO TABS: 1.0000 | ORAL_TABLET | Freq: Four times a day (QID) | ORAL | 0 refills | Status: DC | PRN

## 2019-09-11 MED ORDER — TESTOSTERONE CYPIONATE 200 MG/ML IM SOLN
200.0000 mg | INTRAMUSCULAR | Status: DC
  Administered 2019-09-11 – 2019-12-20 (×5): 200 mg via INTRAMUSCULAR

## 2019-09-11 NOTE — Progress Notes (Addendum)
Patient in the office for [place] testosterone injection  Agree with administration.  Mackie Pai, PA-C

## 2019-09-13 ENCOUNTER — Ambulatory Visit: Payer: Self-pay

## 2019-09-13 ENCOUNTER — Other Ambulatory Visit: Payer: Self-pay

## 2019-09-13 ENCOUNTER — Encounter: Payer: Self-pay | Admitting: Specialist

## 2019-09-13 ENCOUNTER — Ambulatory Visit: Payer: Medicare Other | Admitting: Specialist

## 2019-09-13 VITALS — BP 86/50 | HR 78 | Ht 70.0 in | Wt 198.0 lb

## 2019-09-13 DIAGNOSIS — Z981 Arthrodesis status: Secondary | ICD-10-CM | POA: Diagnosis not present

## 2019-09-13 DIAGNOSIS — T8484XA Pain due to internal orthopedic prosthetic devices, implants and grafts, initial encounter: Secondary | ICD-10-CM

## 2019-09-13 DIAGNOSIS — T84498A Other mechanical complication of other internal orthopedic devices, implants and grafts, initial encounter: Secondary | ICD-10-CM | POA: Diagnosis not present

## 2019-09-13 NOTE — Progress Notes (Signed)
Office Visit Note   Patient: Luis Brown           Date of Birth: 11/11/1945           MRN: [id number] Visit Date: 09/13/2019              Requested by: Luis Pai, PA-C [place],  [place] [id number] PCP: Luis Pai, PA-C   Assessment & Plan: Visit Diagnoses:  1. 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 ([place])   2. Painful orthopaedic hardware ([place])     Plan: No lifting greater than 10 lbs. May use ice or moist heat for pain. Weight loss is of benefit. Best medication for lumbar disc disease is arthritis medications like motrin, celebrex and naprosyn. Exercise is important to improve your indurance and does allow people to function better inspite of back pain. Thoracic MRI to assess healing and rule out thoracic cord compression at the Superior level where screw breakage is demonstrated.   Follow-Up Instructions: No follow-ups on file.   Orders:  Orders Placed This Encounter  Procedures  . XR Thoracic Spine 2 View   No orders of the defined types were placed in this encounter.     Procedures: No procedures performed   Clinical Data: No additional findings.   Subjective: Chief Complaint  Patient presents with  . Spine - Pain    74 year old male with history of lumbar fusion T10 to S1 with revision of L5-S1 and removal of iliac wing screws. 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 Since then  The osteomyelitis is being treated and he is taking doxycycline BID and Dr. Linus Brown has told him that he believes that He will be able to stop the use of the antibiotics in a couple of months. He is having increasing pain in the mid upper thoracic spine Above the area of previous infection and his CRP is elevated with a normal Sed rate.    Review of Systems    Constitutional: Negative.  Negative for activity change, appetite change, chills, diaphoresis, fatigue, fever and unexpected weight change.  HENT: Negative.  Negative for congestion, dental problem, drooling, ear discharge, ear pain, facial swelling, hearing loss, mouth sores, nosebleeds, postnasal drip, rhinorrhea, sinus pressure, sinus pain, sneezing, sore throat, tinnitus, trouble swallowing and voice change.   Eyes: Negative for photophobia, pain, discharge, redness, itching and visual disturbance.  Respiratory: Positive for chest tightness and shortness of breath. Negative for apnea, cough, choking, wheezing and stridor.   Cardiovascular: Positive for chest pain. Negative for palpitations and leg swelling.  Gastrointestinal: Positive for abdominal distention (right upper abdomenal muscle weakness). Negative for abdominal pain, anal bleeding, blood in stool, constipation, diarrhea and nausea.  Endocrine: Negative.  Negative for cold intolerance, heat intolerance, polydipsia, polyphagia and polyuria.  Genitourinary: Negative for difficulty urinating, dysuria, enuresis, flank pain, frequency, genital sores, hematuria and urgency.  Musculoskeletal: Positive for back pain. Negative for arthralgias, gait problem, joint swelling, myalgias, neck pain and neck stiffness.  Skin: Negative.   Allergic/Immunologic: Negative.  Negative for environmental allergies, food allergies and immunocompromised state.  Neurological: Positive for weakness and numbness. Negative for tremors, seizures and syncope.  Hematological: Negative.  Negative for adenopathy. Does not bruise/bleed easily.  Psychiatric/Behavioral: Negative for agitation, behavioral problems, confusion, decreased  concentration, dysphoric mood, hallucinations, self-injury, sleep disturbance and suicidal ideas. The patient is not nervous/anxious and is not hyperactive.      Objective: Vital Signs: BP (!) 86/50 (BP Location: Left Arm, Patient  Position: Sitting)   Pulse 78   Ht 5\' 10"  (1.778 m)   Wt 198 lb (89.8 kg)   BMI 28.41 kg/m   Physical Exam Constitutional:      Appearance: He is well-developed.  HENT:     Head: Normocephalic and atraumatic.  Eyes:     Pupils: Pupils are equal, round, and reactive to light.  Pulmonary:     Effort: Pulmonary effort is normal.     Breath sounds: Normal breath sounds.  Abdominal:     General: Bowel sounds are normal.     Palpations: Abdomen is soft.  Musculoskeletal:     Cervical back: Normal range of motion and neck supple.     Lumbar back: Negative right straight leg raise test and negative left straight leg raise test.  Skin:    General: Skin is warm and dry.  Neurological:     Mental Status: He is alert and oriented to person, place, and time.  Psychiatric:        Behavior: Behavior normal.        Thought Content: Thought content normal.        Judgment: Judgment normal.     Back Exam   Tenderness  The patient is experiencing tenderness in the lumbar.  Range of Motion  Extension: abnormal  Flexion: abnormal  Lateral bend right: abnormal  Lateral bend left: abnormal  Rotation right: abnormal  Rotation left: abnormal   Muscle Strength  Right Quadriceps:  5/5  Left Quadriceps:  5/5  Right Hamstrings:  5/5  Left Hamstrings:  5/5   Tests  Straight leg raise right: negative Straight leg raise left: negative  Reflexes  Patellar: 0/4 Achilles: 0/4 Babinski's sign: normal   Other  Toe walk: normal Heel walk: normal Sensation: normal Gait: normal  Erythema: no back redness Scars: absent  Comments:  Area of discomfort is above the lower scapula angle. Midline      Specialty Comments:  No specialty comments available.  Imaging: No results found.   PMFS History: Patient Active Problem List   Diagnosis Date Noted  . Medication monitoring encounter 08/26/2019  . Peripheral neuropathy 08/07/2019  . Alcohol abuse 05/09/2019  . Hyponatremia  05/05/2019  . 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[place]) 04/04/2019  . Fusion of spine of thoracolumbar region 03/12/2019  . Status post thoracic spinal fusion 02/12/2019  . 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 Thoracic spinal stenosis 04/17/2017  . Painful orthopaedic hardware ([place]) 04/17/2017  . 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 ([place]) 04/17/2017  . Low back pain radiating to both legs 01/16/2017  . Esophageal reflux 02/10/2015  . Medicare annual wellness visit, subsequent 02/10/2015  . Personal history of colonic polyps 10/27/2012  . 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  . BPH (benign prostatic hyperplasia) 04/23/2012  . ED (erectile dysfunction) 04/23/2012  . Allergic state 10/25/2011  . Anxiety and depression 10/25/2011  . Asthma   . Diabetes mellitus without complication ([place])   . Hyperlipidemia   . Overweight   . Insomnia   . Fatigue   . Preventative health care   . DDD (degenerative disc disease), lumbosacral   . Thoracic degenerative disc disease   . HTN (hypertension)    Past Medical  History:  Diagnosis Date  . Acute pharyngitis 10/21/2013  . Allergy    grass and pollen  . Anxiety and depression 10/25/2011  . BPH (benign prostatic hyperplasia) 04/23/2012  . Chicken pox as a child  . DDD (degenerative disc disease)    cervical responds to steroid injections and low back required surgery  . DDD (degenerative disc disease), lumbosacral   . Diabetes mellitus    pre  . Dyspnea   . ED (erectile dysfunction) 04/23/2012  . Elevated BP   . Esophageal reflux 02/10/2015  . Fatigue   . HTN (hypertension)   . Hyperglycemia    preDM   . Hyperlipidemia   . Insomnia   . Low back pain radiating to both legs 01/16/2017  . Measles as a child  . Medicare annual wellness visit, subsequent 02/10/2015  . Overweight(278.02)   . Peripheral neuropathy 08/07/2019  . Personal history of colonic  polyps 10/27/2012   Follows with [facility]  . Pneumonia    " walking"  . Preventative health care   . 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  . Wears glasses     Family History  Problem Relation Age of Onset  . Hypertension Mother   . Diabetes Mother        type 2  . Cancer Mother 52       breast in remission  . Emphysema Father        smoker  . COPD Father        smoker  . Stroke Father 70       mini  . Heart disease Father   . Hypertension Sister   . Hyperlipidemia Sister   . Scoliosis Sister   . Osteoporosis Sister   . Hypertension Maternal Grandmother   . Scoliosis Maternal Grandmother   . Heart disease Maternal Grandfather   . Heart disease Paternal Grandfather        smoker  . Heart disease Daughter     Past Surgical History:  Procedure Laterality Date  . BACK SURGERY  2012 and 1994   Dr Margret Chance, screws and cage in low back  . EYE SURGERY Bilateral    2016  . HARDWARE REVISION  03/12/2019   REVISION OF HARDWARE THORACIC TEN-THORACIC ELEVEN WITH APPLICATION OF ADDITIONAL RODS AND ROD SLEEVES THORACIC EIGHT-THORACIC TWELVE 12-LUMBAR ONE (N/A )  . LAMINECTOMY  02/12/2019   DECOMPRESSIVE LAMINECTOMY THORACIC NINE-THORACIC TEN AND THORACIC TEN-THORACIC ELEVEN, EXTENSION OF THORACOLUMBAR FUSION FROM THORACIC TEN TO THORACIC FIVE, LOCAL BONE GRAFT, ALLOGRAFT AND VIVIGEN (N/A)  . SPINAL FUSION N/A 02/12/2019   Procedure: DECOMPRESSIVE LAMINECTOMY THORACIC NINE-THORACIC TEN AND THORACIC TEN-THORACIC ELEVEN, EXTENSION OF THORACOLUMBAR FUSION FROM THORACIC TEN TO THORACIC FIVE, LOCAL BONE GRAFT, ALLOGRAFT AND VIVIGEN;  Surgeon: Jessy Oto, MD;  Location: [place];  Service: Orthopedics;  Laterality: N/A;  DECOMPRESSIVE LAMINECTOMY THORACIC NINE-THORACIC TEN AND THORACIC TEN-THORACIC ELEVEN, EXTENSION OF TH  . SPINAL FUSION N/A 03/12/2019   Procedure: REVISION OF HARDWARE THORACIC TEN-THORACIC ELEVEN WITH APPLICATION OF ADDITIONAL RODS AND ROD SLEEVES THORACIC  EIGHT-THORACIC TWELVE 12-LUMBAR ONE;  Surgeon: Jessy Oto, MD;  Location: [place];  Service: Orthopedics;  Laterality: N/A;  . TONSILLECTOMY    . TONSILLECTOMY     as child  . torn rotator cuff  2010   right   Social History   Occupational History  . Not on file  Tobacco Use  . Smoking status: Former Smoker    Packs/day: 1.00    Years: 20.00  Pack years: 20.00    Types: Cigarettes    Quit date: 07/18/1989    Years since quitting: 30.1  . Smokeless tobacco: Never Used  . Tobacco comment: off and on smoking for the 20 yrs  Substance and Sexual Activity  . Alcohol use: Yes    Comment: drink or two a day- mixed drink  . Drug use: No  . Sexual activity: Yes    Comment: lives with wife, still working, no dietary restrictions, continues to exercise intermittently

## 2019-09-13 NOTE — Patient Instructions (Signed)
Avoid frequent bending and stooping  No lifting greater than 10 lbs. May use ice or moist heat for pain. Weight loss is of benefit. Best medication for lumbar disc disease is arthritis medications like motrin, celebrex and naprosyn. Exercise is important to improve your indurance and does allow people to function better inspite of back pain. Thoracic MRI to assess healing and rule out thoracic cord compression at the Superior level where screw breakage is demonstrated.

## 2019-09-17 ENCOUNTER — Other Ambulatory Visit: Payer: Self-pay | Admitting: Specialist

## 2019-09-18 MED ORDER — HYDROCODONE-ACETAMINOPHEN 5-325 MG PO TABS: 1.0000 | ORAL_TABLET | Freq: Four times a day (QID) | ORAL | 0 refills | Status: DC | PRN

## 2019-09-18 NOTE — Telephone Encounter (Signed)
JN 

## 2019-09-19 ENCOUNTER — Other Ambulatory Visit: Payer: Self-pay | Admitting: Specialist

## 2019-09-22 ENCOUNTER — Encounter: Payer: Self-pay | Admitting: Specialist

## 2019-09-25 ENCOUNTER — Ambulatory Visit: Payer: Medicare Other

## 2019-09-26 ENCOUNTER — Other Ambulatory Visit: Payer: Self-pay | Admitting: Specialist

## 2019-09-26 ENCOUNTER — Encounter: Payer: Self-pay | Admitting: Specialist

## 2019-09-26 MED ORDER — HYDROCODONE-ACETAMINOPHEN 5-325 MG PO TABS: 1.0000 | ORAL_TABLET | Freq: Four times a day (QID) | ORAL | 0 refills | Status: DC | PRN

## 2019-09-26 NOTE — Telephone Encounter (Signed)
This is a JN pt.  

## 2019-10-02 ENCOUNTER — Ambulatory Visit: Payer: Medicare Other

## 2019-10-02 ENCOUNTER — Ambulatory Visit: Payer: Medicare Other | Admitting: Specialist

## 2019-10-03 ENCOUNTER — Ambulatory Visit (INDEPENDENT_AMBULATORY_CARE_PROVIDER_SITE_OTHER): Payer: Medicare Other

## 2019-10-03 ENCOUNTER — Other Ambulatory Visit: Payer: Self-pay | Admitting: Specialist

## 2019-10-03 ENCOUNTER — Other Ambulatory Visit: Payer: Self-pay

## 2019-10-03 DIAGNOSIS — R7989 Other specified abnormal findings of blood chemistry: Secondary | ICD-10-CM

## 2019-10-03 DIAGNOSIS — E349 Endocrine disorder, unspecified: Secondary | ICD-10-CM

## 2019-10-03 MED ORDER — HYDROCODONE-ACETAMINOPHEN 5-325 MG PO TABS: 1.0000 | ORAL_TABLET | Freq: Four times a day (QID) | ORAL | 0 refills | Status: DC | PRN

## 2019-10-03 NOTE — Progress Notes (Addendum)
Patient here today for testosterone cypionate 200mg /ml injection. Patient gets 200 mg every 2 weeks. Injection administered w/o any complications. Patient provided medication.  Agree with administration of testosterone for pt.  Mackie Pai, PA-C

## 2019-10-04 ENCOUNTER — Other Ambulatory Visit: Payer: Self-pay | Admitting: Specialist

## 2019-10-05 ENCOUNTER — Other Ambulatory Visit: Payer: Self-pay | Admitting: Medical

## 2019-10-08 ENCOUNTER — Other Ambulatory Visit: Payer: Self-pay

## 2019-10-08 ENCOUNTER — Encounter: Payer: Self-pay | Admitting: Medical

## 2019-10-08 ENCOUNTER — Ambulatory Visit (INDEPENDENT_AMBULATORY_CARE_PROVIDER_SITE_OTHER): Payer: Medicare Other | Admitting: Medical

## 2019-10-08 VITALS — BP 125/75 | HR 81 | Temp 97.2°F | Ht 70.0 in | Wt 193.0 lb

## 2019-10-08 DIAGNOSIS — R351 Nocturia: Secondary | ICD-10-CM | POA: Diagnosis not present

## 2019-10-08 DIAGNOSIS — M869 Osteomyelitis, unspecified: Secondary | ICD-10-CM | POA: Diagnosis not present

## 2019-10-08 DIAGNOSIS — G629 Polyneuropathy, unspecified: Secondary | ICD-10-CM | POA: Diagnosis not present

## 2019-10-08 DIAGNOSIS — R42 Dizziness and giddiness: Secondary | ICD-10-CM | POA: Diagnosis not present

## 2019-10-08 DIAGNOSIS — E119 Type 2 diabetes mellitus without complications: Secondary | ICD-10-CM

## 2019-10-08 DIAGNOSIS — R5383 Other fatigue: Secondary | ICD-10-CM

## 2019-10-08 DIAGNOSIS — M545 Low back pain: Secondary | ICD-10-CM

## 2019-10-08 DIAGNOSIS — E611 Iron deficiency: Secondary | ICD-10-CM

## 2019-10-08 DIAGNOSIS — M79604 Pain in right leg: Secondary | ICD-10-CM

## 2019-10-08 DIAGNOSIS — R7989 Other specified abnormal findings of blood chemistry: Secondary | ICD-10-CM

## 2019-10-08 LAB — POC URINALSYSI DIPSTICK (AUTOMATED)
Bilirubin, UA: NEGATIVE
Blood, UA: NEGATIVE
Glucose, UA: NEGATIVE
Ketones, UA: NEGATIVE
Leukocytes, UA: NEGATIVE
Nitrite, UA: NEGATIVE
Protein, UA: POSITIVE — AB
Spec Grav, UA: 1.02 (ref 1.010–1.025)
Urobilinogen, UA: NEGATIVE E.U./dL — AB
pH, UA: 6.5 (ref 5.0–8.0)

## 2019-10-08 LAB — COMPREHENSIVE METABOLIC PANEL
ALT: 13 U/L (ref 0–53)
AST: 21 U/L (ref 0–37)
Albumin: 3.9 g/dL (ref 3.5–5.2)
Alkaline Phosphatase: 99 U/L (ref 39–117)
BUN: 21 mg/dL (ref 6–23)
CO2: 30 mEq/L (ref 19–32)
Calcium: 9.2 mg/dL (ref 8.4–10.5)
Chloride: 102 mEq/L (ref 96–112)
Creatinine, Ser: 0.82 mg/dL (ref 0.40–1.50)
GFR: 91.32 mL/min (ref >60.00)
Glucose, Bld: 126 mg/dL — ABNORMAL HIGH (ref 70–99)
Potassium: 5 mEq/L (ref 3.5–5.1)
Sodium: 137 mEq/L (ref 135–145)
Total Bilirubin: 0.5 mg/dL (ref 0.2–1.2)
Total Protein: 6.2 g/dL (ref 6.0–8.3)

## 2019-10-08 LAB — CBC WITH DIFFERENTIAL/PLATELET
Basophils Absolute: 0 10*3/uL (ref 0.0–0.1)
Basophils Relative: 0.6 % (ref 0.0–3.0)
Eosinophils Absolute: 0.2 10*3/uL (ref 0.0–0.7)
Eosinophils Relative: 2.8 % (ref 0.0–5.0)
HCT: 34.2 % — ABNORMAL LOW (ref 39.0–52.0)
Hemoglobin: 11.4 g/dL — ABNORMAL LOW (ref 13.0–17.0)
Lymphocytes Relative: 8.4 % — ABNORMAL LOW (ref 12.0–46.0)
Lymphs Abs: 0.6 10*3/uL — ABNORMAL LOW (ref 0.7–4.0)
MCHC: 33.2 g/dL (ref 30.0–36.0)
MCV: 87.7 fl (ref 78.0–100.0)
Monocytes Absolute: 0.8 10*3/uL (ref 0.1–1.0)
Monocytes Relative: 10.7 % (ref 3.0–12.0)
Neutro Abs: 5.9 10*3/uL (ref 1.4–7.7)
Neutrophils Relative %: 77.5 % — ABNORMAL HIGH (ref 43.0–77.0)
Platelets: 418 10*3/uL — ABNORMAL HIGH (ref 150.0–400.0)
RBC: 3.9 Mil/uL — ABNORMAL LOW (ref 4.22–5.81)
RDW: 15.5 % (ref 11.5–15.5)
WBC: 7.6 10*3/uL (ref 4.0–10.5)

## 2019-10-08 LAB — SEDIMENTATION RATE: Sed Rate: 53 mm/hr — ABNORMAL HIGH (ref 0–20)

## 2019-10-08 LAB — C-REACTIVE PROTEIN: CRP: 5.1 mg/dL (ref 0.5–20.0)

## 2019-10-08 LAB — TSH: TSH: 3.42 u[IU]/mL (ref 0.35–4.50)

## 2019-10-08 LAB — VITAMIN B12: Vitamin B-12: 267 pg/mL (ref 211–911)

## 2019-10-08 LAB — HEMOGLOBIN A1C: Hgb A1c MFr Bld: 7 % — ABNORMAL HIGH (ref 4.6–6.5)

## 2019-10-08 LAB — PSA: PSA: 0.65 ng/mL (ref 0.10–4.00)

## 2019-10-08 LAB — IRON: Iron: 40 ug/dL — ABNORMAL LOW (ref 42–165)

## 2019-10-08 MED ORDER — MECLIZINE HCL 12.5 MG PO TABS: 12.5000 mg | ORAL_TABLET | Freq: Three times a day (TID) | ORAL | 0 refills | Status: DC | PRN

## 2019-10-08 NOTE — Patient Instructions (Addendum)
Recent recent mild to moderate dizziness with vertigo.  Mostly resolved at present.  Good neurologic exam.  Will prescribe meclizine to use as needed.  Rx advised and given.  Stay well-hydrated.  Providing Epley maneuver information today milligrams if vertigo recur.  If dizziness with gross motor/sensory function deficits then recommend ED evaluation for CT.  If dizziness mild and persisting then consider outpatient CT of head.  Not indicated presently.   ekg done due to dizziness. Sinus rhythm. No acute changes. Very similar to prior ekgs.  For fatigue, ordered CBC, CMP and TSH.  For frequent urination will get PSA and send urine out for culture.  For back pain and osteomyelitis, will get sed rate and CRP.  Glad to hear that you are getting MRI within the next month with your specialist.  For history of diabetes will get A1c today.  For neuropathy we will get B12 and B1.  Follow-up date to be determined after lab review.  How to Perform the Epley Maneuver The Epley maneuver is an exercise that relieves symptoms of vertigo. Vertigo is the feeling that you or your surroundings are moving when they are not. When you feel vertigo, you may feel like the room is spinning and have trouble walking. Dizziness is a little different than vertigo. When you are dizzy, you may feel unsteady or light-headed. You can do this maneuver at home whenever you have symptoms of vertigo. You can do it up to 3 times a day until your symptoms go away. Even though the Epley maneuver may relieve your vertigo for a few weeks, it is possible that your symptoms will return. This maneuver relieves vertigo, but it does not relieve dizziness. What are the risks? If it is done correctly, the Epley maneuver is considered safe. Sometimes it can lead to dizziness or nausea that goes away after a short time. If you develop other symptoms, such as changes in vision, weakness, or numbness, stop doing the maneuver and call your health  care provider. How to perform the Epley maneuver 1. Sit on the edge of a bed or table with your back straight and your legs extended or hanging over the edge of the bed or table. 2. Turn your head halfway toward the affected ear or side. 3. Lie backward quickly with your head turned until you are lying flat on your back. You may want to position a pillow under your shoulders. 4. Hold this position for 30 seconds. You may experience an attack of vertigo. This is normal. 5. Turn your head to the opposite direction until your unaffected ear is facing the floor. 6. Hold this position for 30 seconds. You may experience an attack of vertigo. This is normal. Hold this position until the vertigo stops. 7. Turn your whole body to the same side as your head. Hold for another 30 seconds. 8. Sit back up. You can repeat this exercise up to 3 times a day. Follow these instructions at home:  After doing the Epley maneuver, you can return to your normal activities.  Ask your health care provider if there is anything you should do at home to prevent vertigo. He or she may recommend that you: ? Keep your head raised (elevated) with two or more pillows while you sleep. ? Do not sleep on the side of your affected ear. ? Get up slowly from bed. ? Avoid sudden movements during the day. ? Avoid extreme head movement, like looking up or bending over. Contact a health  care provider if:  Your vertigo gets worse.  You have other symptoms, including: ? Nausea. ? Vomiting. ? Headache. Get help right away if:  You have vision changes.  You have a severe or worsening headache or neck pain.  You cannot stop vomiting.  You have new numbness or weakness in any part of your body. Summary  Vertigo is the feeling that you or your surroundings are moving when they are not.  The Epley maneuver is an exercise that relieves symptoms of vertigo.  If the Epley maneuver is done correctly, it is considered safe. You  can do it up to 3 times a day. This information is not intended to replace advice given to you by your health care provider. Make sure you discuss any questions you have with your health care provider. Document Revised: 06/16/2017 Document Reviewed: 05/24/2016 Elsevier Patient Education  2020 [employer].

## 2019-10-08 NOTE — Progress Notes (Signed)
Subjective:    [name], male    DOB: [birth year], 74 y.o.   MRN: [id number]  HPI  Pt in for recent fatigue and some dizziness recently. We did ekg for caution sake.   Dizziness is better today than it was yesterday. Pt took sudafed yesterday. Rare sneeze. Took sudafed. No gross motor or sensory function deficits. Some vertigo yesterday. Tilting his head upward brief vertigo. Today brief vertigo as well. Past 2 hours feels well.  Pt has hx of subacute osteomyelitis for which he has been placed on doxycycline. Pt is going to get mri upcoming.  Pt ID MD checked sed rate and crp last visit.  Pt sugars have been 150-180 recently. Pt is on metformin daily.  Some frequent urination about 10 times at night.  He mentions some neuropathy of his feet.    Review of Systems  Constitutional: Positive for fatigue. Negative for chills, diaphoresis and fever.  Respiratory: Negative for choking, shortness of breath and wheezing.   Cardiovascular: Negative for chest pain and palpitations.  Gastrointestinal: Negative for abdominal pain, nausea and rectal pain.  Genitourinary: Positive for frequency.       Last 2-3 weeks urinating at night about 10 times at night.   Musculoskeletal: Positive for back pain.       Chronic back pain.  Neurological: Positive for dizziness.       Very faint and much less than it was yesterday.     Past Medical History:  Diagnosis Date  . Acute pharyngitis 10/21/2013  . Allergy    grass and pollen  . Anxiety and depression 10/25/2011  . BPH (benign prostatic hyperplasia) 04/23/2012  . Chicken pox as a child  . DDD (degenerative disc disease)    cervical responds to steroid injections and low back required surgery  . DDD (degenerative disc disease), lumbosacral   . Diabetes mellitus    pre  . Dyspnea   . ED (erectile dysfunction) 04/23/2012  . Elevated BP   . Esophageal reflux 02/10/2015  . Fatigue   . HTN (hypertension)   . Hyperglycemia    preDM   . Hyperlipidemia   . Insomnia   . Low back pain radiating to both legs 01/16/2017  . Measles as a child  . Medicare annual wellness visit, subsequent 02/10/2015  . Overweight(278.02)   . Peripheral neuropathy 08/07/2019  . Personal history of colonic polyps 10/27/2012   Follows with [facility]  . Pneumonia    " walking"  . Preventative health care   . 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  . Wears glasses      Social History   Socioeconomic History  . Marital status: Married    Spouse name: Not on file  . Number of children: Not on file  . Years of education: Not on file  . Highest education level: Not on file  Occupational History  . Not on file  Tobacco Use  . Smoking status: Former Smoker    Packs/day: 1.00    Years: 20.00    Pack years: 20.00    Types: Cigarettes    Quit date: 07/18/1989    Years since quitting: 30.2  . Smokeless tobacco: Never Used  . Tobacco comment: off and on smoking for the 20 yrs  Substance and Sexual Activity  . Alcohol use: Yes    Comment: drink or two a day- mixed drink  . Drug use: No  . Sexual activity: Yes    Comment: lives with wife, still  working, no dietary restrictions, continues to exercise intermittently  Other Topics Concern  . Not on file  Social History Narrative  . Not on file   Social Determinants of Health   Financial Resource Strain: Low Risk   . Difficulty of Paying Living Expenses: Not hard at all  Food Insecurity:   . Worried About Charity fundraiser in the Last Year:   . Arboriculturist in the Last Year:   Transportation Needs: No Transportation Needs  . Lack of Transportation (Medical): No  . Lack of Transportation (Non-Medical): No  Physical Activity:   . Days of Exercise per Week:   . Minutes of Exercise per Session:   Stress:   . Feeling of Stress :   Social Connections:   . Frequency of Communication with Friends and Family:   . Frequency of Social Gatherings with Friends and Family:   .  Attends Religious Services:   . Active Member of Clubs or Organizations:   . Attends Archivist Meetings:   Marland Kitchen Marital Status:   Intimate Partner Violence:   . Fear of Current or Ex-Partner:   . Emotionally Abused:   Marland Kitchen Physically Abused:   . Sexually Abused:     Past Surgical History:  Procedure Laterality Date  . BACK SURGERY  2012 and 1994   Dr Margret Chance, screws and cage in low back  . EYE SURGERY Bilateral    2016  . HARDWARE REVISION  03/12/2019   REVISION OF HARDWARE THORACIC TEN-THORACIC ELEVEN WITH APPLICATION OF ADDITIONAL RODS AND ROD SLEEVES THORACIC EIGHT-THORACIC TWELVE 12-LUMBAR ONE (N/A )  . LAMINECTOMY  02/12/2019   DECOMPRESSIVE LAMINECTOMY THORACIC NINE-THORACIC TEN AND THORACIC TEN-THORACIC ELEVEN, EXTENSION OF THORACOLUMBAR FUSION FROM THORACIC TEN TO THORACIC FIVE, LOCAL BONE GRAFT, ALLOGRAFT AND VIVIGEN (N/A)  . SPINAL FUSION N/A 02/12/2019   Procedure: DECOMPRESSIVE LAMINECTOMY THORACIC NINE-THORACIC TEN AND THORACIC TEN-THORACIC ELEVEN, EXTENSION OF THORACOLUMBAR FUSION FROM THORACIC TEN TO THORACIC FIVE, LOCAL BONE GRAFT, ALLOGRAFT AND VIVIGEN;  Surgeon: Jessy Oto, MD;  Location: [facility];  Service: Orthopedics;  Laterality: N/A;  DECOMPRESSIVE LAMINECTOMY THORACIC NINE-THORACIC TEN AND THORACIC TEN-THORACIC ELEVEN, EXTENSION OF TH  . SPINAL FUSION N/A 03/12/2019   Procedure: REVISION OF HARDWARE THORACIC TEN-THORACIC ELEVEN WITH APPLICATION OF ADDITIONAL RODS AND ROD SLEEVES THORACIC EIGHT-THORACIC TWELVE 12-LUMBAR ONE;  Surgeon: Jessy Oto, MD;  Location: [place] [facility];  Service: Orthopedics;  Laterality: N/A;  . TONSILLECTOMY    . TONSILLECTOMY     as child  . torn rotator cuff  2010   right    Family History  Problem Relation Age of Onset  . Hypertension Mother   . Diabetes Mother        type 2  . Cancer Mother 41       breast in remission  . Emphysema Father        smoker  . COPD Father        smoker  . Stroke Father 51       mini  . Heart  disease Father   . Hypertension Sister   . Hyperlipidemia Sister   . Scoliosis Sister   . Osteoporosis Sister   . Hypertension Maternal Grandmother   . Scoliosis Maternal Grandmother   . Heart disease Maternal Grandfather   . Heart disease Paternal Grandfather        smoker  . Heart disease Daughter     No Known Allergies  Current Outpatient Medications on File Prior to  Visit  Medication Sig Dispense Refill  . atorvastatin (LIPITOR) 20 MG tablet TAKE 1/2 TABLET BY MOUTH DAILY AT 6 PM 45 tablet 0  . busPIRone (BUSPAR) 7.5 MG tablet 1-2 TAB BY MOUTH 3 TIMES A DAY FOR ANXIETY 540 tablet 1  . cetirizine (ZYRTEC) 10 MG tablet Take 10 mg by mouth daily. In the morning    . diazepam (VALIUM) 10 MG tablet TAKE 1 TABLET(10 MG) BY MOUTH AT BEDTIME AS NEEDED 30 tablet 1  . doxycycline (VIBRA-TABS) 100 MG tablet Take 1 tablet (100 mg total) by mouth 2 (two) times daily. 60 tablet 3  . famotidine (PEPCID) 40 MG tablet TAKE 1 TABLET BY MOUTH EVERYDAY AT BEDTIME 90 tablet 1  . FERRETTS 325 (106 Fe) MG TABS tablet TAKE 1 TABLET (106 MG OF IRON TOTAL) BY MOUTH DAILY. 30 tablet 2  . gabapentin (NEURONTIN) 300 MG capsule Take 1 capsule (300 mg total) by mouth 3 (three) times daily. (Patient not taking: Reported on 10/08/2019) 270 capsule 3  . glucose blood (CONTOUR NEXT TEST) test strip Check blood sugar once daily 100 each 12  . HYDROcodone-acetaminophen (NORCO/VICODIN) 5-325 MG tablet Take 1 tablet by mouth every 6 (six) hours as needed for moderate pain. (Patient not taking: Reported on 10/08/2019) 30 tablet 0  . ibuprofen (ADVIL) 600 MG tablet TAKE 1 TABLET BY MOUTH EVERY 8 (EIGHT) HOURS AS NEEDED FOR MILD PAIN OR MODERATE PAIN. (Patient not taking: Reported on 10/08/2019) 90 tablet 0  . lisinopril (ZESTRIL) 5 MG tablet TAKE 1 TABLET BY MOUTH TWICE A DAY 180 tablet 1  . metFORMIN (GLUCOPHAGE) 500 MG tablet Take 1 tablet (500 mg total) by mouth 2 (two) times daily with a meal. 90 tablet 3  . tamsulosin  (FLOMAX) 0.4 MG CAPS capsule TAKE TWO CAPSULES BY MOUTH DAILY  180 capsule 0  . testosterone cypionate (DEPOTESTOSTERONE CYPIONATE) 200 MG/ML injection Inject 1 mL (200 mg total) into the muscle every 14 (fourteen) days. 1 mL 5  . tiZANidine (ZANAFLEX) 4 MG tablet TAKE 1.5 TABLETS (6 MG TOTAL) BY MOUTH EVERY 6 (SIX) HOURS AS NEEDED FOR MUSCLE SPASMS. 90 tablet 1  . venlafaxine XR (EFFEXOR-XR) 150 MG 24 hr capsule TAKE ONE CAPSULE BY MOUTH IN THE MORNING with breakfast 90 capsule 0   Current Facility-Administered Medications on File Prior to Visit  Medication Dose Route Frequency Provider Last Rate Last Admin  . cefTRIAXone (ROCEPHIN) 2 g in dextrose 5 % 50 mL IVPB  2 g Intravenous Q24H Powers, Evern Core, MD      . testosterone cypionate (DEPOTESTOSTERONE CYPIONATE) injection 200 mg  200 mg Intramuscular Q14 Days Jarrid Lienhard, Percell Miller, PA-C   200 mg at 07/16/19 [id number]  . testosterone cypionate (DEPOTESTOSTERONE CYPIONATE) injection 200 mg  200 mg Intramuscular Q14 Days Fynn Vanblarcom, Percell Miller, PA-C   200 mg at 10/03/19 1026    BP (!) 161/71   Pulse 81   Temp (!) 97.2 F (36.2 C) (Temporal)   Ht 5\' 10"  (1.778 m)   Wt 193 lb (87.5 kg)   SpO2 96%   BMI 27.69 kg/m       Objective:   Physical Exam  General Mental Status- Alert. General Appearance- Not in acute distress.   Skin General: Color- Normal Color. Moisture- Normal Moisture.  Neck Carotid Arteries- Normal color. Moisture- Normal Moisture. No carotid bruits. No JVD.  Chest and Lung Exam Auscultation: Breath Sounds:-Normal.  Cardiovascular Auscultation:Rythm- Regular. Murmurs & Other Heart Sounds:Auscultation of the heart reveals- No Murmurs.  Abdomen  Inspection:-Inspeection Normal. Palpation/Percussion:Note:No mass. Palpation and Percussion of the abdomen reveal- Non Tender, Non Distended + BS, no rebound or guarding.    Neurologic Cranial Nerve exam:- CN III-XII intact(No nystagmus), symmetric smile. Drift Test:- No  drift. Romberg Exam:- Negative.  Finger to Nose:- Normal/Intact Strength:- 5/5 equal and symmetric strength both upper and lower extremities.  Back- midline spine car.  heent- negative. Normal canals and normal tms.     Assessment & Plan:  Recent recent mild to moderate dizziness with vertigo.  Mostly resolved at present.  Good neurologic exam.  Will prescribe meclizine to use as needed.  Rx advised and given.  Stay well-hydrated.  Providing Epley maneuver information today milligrams if vertigo recur.  If dizziness with gross motor/sensory function deficits then recommend ED evaluation for CT.  If dizziness mild and persisting then consider outpatient CT of head.  Not indicated presently.  ekg done due to dizziness. Sinus rhythm. No acute changes. Very similar to prior ekgs.  For fatigue, ordered CBC, CMP and TSH.  For frequent urination will get PSA and send urine out for culture.  For backpain, osteomyelitis and osteomyelitis, will get sed rate and CRP.  Glad to hear that you are getting MRI within the next month with your specialist.  For history of diabetes will get A1c today.  For neuropathy we will get B12 and B1.  Follow-up date to be determined after lab review.  Time spent on chart review,discussion of  treatment plans, work up for conditions,  follow up plan and documentation  45  Minutes.  Conditions addressed today for vertigo, fatigue, frequent urination, back pain, osteomyelitis, diabetes, neuropathy and low iron  Mackie Pai, PA-C

## 2019-10-09 ENCOUNTER — Encounter: Payer: Self-pay | Admitting: Medical

## 2019-10-09 ENCOUNTER — Other Ambulatory Visit: Payer: Self-pay | Admitting: Specialist

## 2019-10-10 MED ORDER — HYDROCODONE-ACETAMINOPHEN 5-325 MG PO TABS: 1.0000 | ORAL_TABLET | Freq: Four times a day (QID) | ORAL | 0 refills | Status: DC | PRN

## 2019-10-10 NOTE — Telephone Encounter (Signed)
JN 

## 2019-10-11 ENCOUNTER — Ambulatory Visit
Admission: RE | Admit: 2019-10-11 | Discharge: 2019-10-11 | Disposition: A | Discharge status: Home / Self Care | Payer: Medicare Other | Source: Ambulatory Visit | Attending: Specialist | Admitting: Specialist

## 2019-10-11 ENCOUNTER — Telehealth: Payer: Self-pay | Admitting: Medical

## 2019-10-11 ENCOUNTER — Telehealth: Payer: Self-pay | Admitting: Infectious Disease

## 2019-10-11 DIAGNOSIS — M4324 Fusion of spine, thoracic region: Secondary | ICD-10-CM | POA: Diagnosis not present

## 2019-10-11 DIAGNOSIS — M4804 Spinal stenosis, thoracic region: Secondary | ICD-10-CM | POA: Diagnosis not present

## 2019-10-11 DIAGNOSIS — Z981 Arthrodesis status: Secondary | ICD-10-CM

## 2019-10-11 MED ORDER — GADOBENATE DIMEGLUMINE 529 MG/ML IV SOLN
18.0000 mL | Freq: Once | INTRAVENOUS | Status: AC | PRN
  Administered 2019-10-11: 18 mL via INTRAVENOUS

## 2019-10-11 NOTE — Telephone Encounter (Signed)
Left message on patient's voice mail :  Appointment request for follow up . Scheduled for 10-15-19 9:45 am. Pleases call to confirm.   Laverle Patter, RN   Patient returned call. Confirmed

## 2019-10-11 NOTE — Telephone Encounter (Signed)
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 Not clear duration  He will see if IR can aspirate for culture but we need to get pt seen in [facility] next week and then arrange for some IV antibiotics.  I believe it is only me plus one NP in clinic and I have only AM clinics next week

## 2019-10-11 NOTE — Telephone Encounter (Signed)
Opened by accident

## 2019-10-12 ENCOUNTER — Other Ambulatory Visit: Payer: Self-pay | Admitting: Specialist

## 2019-10-12 LAB — URINE CULTURE
MICRO NUMBER:: 10282325
Result:: NO GROWTH
SPECIMEN QUALITY:: ADEQUATE

## 2019-10-12 LAB — VITAMIN B1: Vitamin B1 (Thiamine): 32 nmol/L — ABNORMAL HIGH (ref 8–30)

## 2019-10-14 ENCOUNTER — Telehealth: Payer: Self-pay

## 2019-10-14 NOTE — Telephone Encounter (Signed)
COVID-19 Pre-Screening Questions:10/14/19  Do you currently have a fever (>100 F), chills or unexplained body aches?NO   Are you currently experiencing new cough, shortness of breath, sore throat, runny nose?NO  .  Have you recently travelled outside the state of [place] in the last 14 days? NO .  Have you been in contact with someone that is currently pending confirmation of Covid19 testing or has been confirmed to have the [place] virus?  NO  **If the patient answers NO to ALL questions -  advise the patient to please call the clinic before coming to the office should any symptoms develop.

## 2019-10-15 ENCOUNTER — Ambulatory Visit: Payer: Medicare Other | Admitting: Infectious Disease

## 2019-10-15 ENCOUNTER — Other Ambulatory Visit: Payer: Self-pay

## 2019-10-15 ENCOUNTER — Other Ambulatory Visit: Payer: Self-pay | Admitting: Specialist

## 2019-10-15 ENCOUNTER — Encounter: Payer: Self-pay | Admitting: Infectious Disease

## 2019-10-15 ENCOUNTER — Other Ambulatory Visit: Payer: Self-pay | Admitting: Medical

## 2019-10-15 ENCOUNTER — Telehealth: Payer: Self-pay | Admitting: Medical

## 2019-10-15 VITALS — BP 179/80 | HR 65 | Wt 195.2 lb

## 2019-10-15 DIAGNOSIS — M4624 Osteomyelitis of vertebra, thoracic region: Secondary | ICD-10-CM

## 2019-10-15 DIAGNOSIS — M4325 Fusion of spine, thoracolumbar region: Secondary | ICD-10-CM

## 2019-10-15 DIAGNOSIS — G062 Extradural and subdural abscess, unspecified: Secondary | ICD-10-CM

## 2019-10-15 DIAGNOSIS — M48062 Spinal stenosis, lumbar region with neurogenic claudication: Secondary | ICD-10-CM | POA: Diagnosis not present

## 2019-10-15 HISTORY — DX: Extradural and subdural abscess, unspecified: G06.2

## 2019-10-15 MED ORDER — TESTOSTERONE CYPIONATE 200 MG/ML IM SOLN: 200.0000 mg | INTRAMUSCULAR | 5 refills | Status: DC

## 2019-10-15 NOTE — Telephone Encounter (Signed)
Medication: testosterone cypionate (DEPOTESTOSTERONE CYPIONATE) 200 MG/ML injection   Has the patient contacted their pharmacy? Yes.   (If no, request that the patient contact the pharmacy for the refill.) (If yes, when and what did the pharmacy advise?)  Preferred Pharmacy (with phone number or street name):   [facility] [place]  [address], [place] [address]  Phone:  [phone] Fax:  [phone]  . Agent: Please be advised that RX refills may take up to 3 business days. We ask that you follow-up with your pharmacy.

## 2019-10-15 NOTE — Progress Notes (Signed)
Subjective:  Chief complaint generalized malaise also mid back pain dizziness  [name], male    DOB: Jun 18, 1946, 74 y.o.   MRN: [id number]  HPI  7 -year-old man with a history of degenerative disc disease and multiple spinal fusions who was initially seen by Dr. Prince Rome in our group.  He has a rather complex surgical history which in regards to his back began in 2015 Dr. Agapito Games performed an T8-L1 fusion to assist with left leg radicular pain and numbness.  Despite multiple visits to see his surgeon at [facility] postoperatively, the patient states that his left leg numbness and direct low thoracic back pain returned within months after his surgery.  Imaging in 2018 showed that he had an L5-S1 rod breakage and loosening of the pedicle screws at that level.  He sought a second opinion with Dr. Louanne Skye late in 2018.  In November 2018, he had a replacement of the L5-S1 fusion segment and bone grafting was performed simultaneously.  Per the operative note reviewed, the broken rod was left intact at that time.  He reports several partial or near falls following this procedure, but he denies any total falls or direct trauma to his back in the past several years.  As his back pain and left leg radicular symptoms gradually returned, further imaging was performed that suggested screw loosening at the T9-T11 section of his spinal fusion.  On February 12, 2019 he returned to the OR with Dr. Louanne Skye and was found to have screw loosening at the same levels, so his fusion was extended superiorly to T5 and bone grafting was performed.  Initially, the patient had a good postoperative course, but over weeks 2 and 3 postoperatively, he began developing pain in his back at his operative site, prompting plain films to be obtained.  X-rays showed a loss of fixation of the opposite left internal rod fastener at the site of his severe back pain and the development of acute loosening of the interval rod fasteners  and loss of fixation.  On March 12, 2019, he was taken back to the operating room where it was noted that his Harrington rod had loosened and nearly entirely migrated out of position.  A culture was obtained near the pedicle screw at T10 as a small amount of soft tissue with significant bone osteomalacia was observed.  Given concern for his spinal stability, his recently placed Harrington rods were removed and revised/replaced.  Operative cultures were followed and remained negative at the time of his hospital discharge on March 14, 2019.  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    He was then placed on ceftriaxone in October with improvement in his symptoms and his inflammatory markers.  He completed his the ceftriaxone and changed over to doxycycline.  He saw Dr. De Burrs in February and seemed to be doing relatively well.  Since then he did have some episodes of feeling poorly and feeling flushed and feeling like "something not right I think I am sick".  His wife who was on the phone today when he visited [place] in clinic stated that some of this was around the second dose of his COVID-19 vaccine.  Nonetheless he has persisted in feeling poorly fatigued having episodes of flushing.  He also has continued mid back pain largely at night when he lies flat relieved by lying on the side.  Dr. 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 Extensive ventral and dorsal epidural thickening and enhancement around the cord from T4 there approximately T6 compatible with epidural infection. And probable osteomyelitis of T6.  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  Dr. Louanne Skye called me arrange for the patient be seen in clinic today.  On exam he appears stable and does not have any new  neurological deficits.  Dr. Louanne Skye was going to be in touch with radiology to see if they might build a sample a disc for culture.    Past Medical History:  Diagnosis Date  . Acute pharyngitis 10/21/2013  . Allergy    grass and pollen  . Anxiety and depression 10/25/2011  . BPH (benign prostatic hyperplasia) 04/23/2012  . Chicken pox as a child  . DDD (degenerative disc disease)    cervical responds to steroid injections and low back required surgery  . DDD (degenerative disc disease), lumbosacral   . Diabetes mellitus    pre  . Dyspnea   . ED (erectile dysfunction) 04/23/2012  . Elevated BP   . Esophageal reflux 02/10/2015  . Fatigue   . HTN (hypertension)   . Hyperglycemia    preDM   . Hyperlipidemia   . Insomnia   . Low back pain radiating to both legs 01/16/2017  . Measles as a child  . Medicare annual wellness visit, subsequent 02/10/2015  . Overweight(278.02)   . Peripheral neuropathy 08/07/2019  . Personal history of colonic polyps 10/27/2012   Follows with [facility]  . Pneumonia    " walking"  . Preventative health care   . 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  . Wears glasses     Past Surgical History:  Procedure Laterality Date  . BACK SURGERY  2012 and 1994   Dr Margret Chance, screws and cage in low back  . EYE SURGERY Bilateral    2016  . HARDWARE REVISION  03/12/2019   REVISION OF HARDWARE THORACIC TEN-THORACIC ELEVEN WITH APPLICATION OF ADDITIONAL RODS AND ROD SLEEVES THORACIC EIGHT-THORACIC TWELVE 12-LUMBAR ONE (N/A )  . LAMINECTOMY  02/12/2019   DECOMPRESSIVE LAMINECTOMY THORACIC NINE-THORACIC TEN AND THORACIC TEN-THORACIC ELEVEN, EXTENSION OF THORACOLUMBAR FUSION FROM THORACIC TEN TO THORACIC FIVE, LOCAL BONE GRAFT, ALLOGRAFT AND VIVIGEN (N/A)  . SPINAL FUSION N/A 02/12/2019   Procedure: DECOMPRESSIVE LAMINECTOMY THORACIC NINE-THORACIC TEN AND THORACIC TEN-THORACIC ELEVEN, EXTENSION OF THORACOLUMBAR FUSION FROM THORACIC TEN TO THORACIC FIVE, LOCAL BONE  GRAFT, ALLOGRAFT AND VIVIGEN;  Surgeon: Jessy Oto, MD;  Location: [place];  Service: Orthopedics;  Laterality: N/A;  DECOMPRESSIVE LAMINECTOMY THORACIC NINE-THORACIC TEN AND THORACIC TEN-THORACIC ELEVEN, EXTENSION OF TH  . SPINAL FUSION N/A 03/12/2019   Procedure: REVISION OF HARDWARE THORACIC TEN-THORACIC ELEVEN WITH APPLICATION OF ADDITIONAL RODS AND ROD SLEEVES THORACIC EIGHT-THORACIC TWELVE 12-LUMBAR ONE;  Surgeon: Jessy Oto, MD;  Location: [place];  Service: Orthopedics;  Laterality: N/A;  . TONSILLECTOMY    . TONSILLECTOMY     as child  . torn rotator cuff  2010   right    Family History  Problem Relation Age of Onset  . Hypertension Mother   . Diabetes Mother        type 2  . Cancer Mother 86       breast in remission  . Emphysema Father        smoker  . COPD Father        smoker  .  Stroke Father 44       mini  . Heart disease Father   . Hypertension Sister   . Hyperlipidemia Sister   . Scoliosis Sister   . Osteoporosis Sister   . Hypertension Maternal Grandmother   . Scoliosis Maternal Grandmother   . Heart disease Maternal Grandfather   . Heart disease Paternal Grandfather        smoker  . Heart disease Daughter       Social History   Socioeconomic History  . Marital status: Married    Spouse name: Not on file  . Number of children: Not on file  . Years of education: Not on file  . Highest education level: Not on file  Occupational History  . Not on file  Tobacco Use  . Smoking status: Former Smoker    Packs/day: 1.00    Years: 20.00    Pack years: 20.00    Types: Cigarettes    Quit date: 07/18/1989    Years since quitting: 30.2  . Smokeless tobacco: Never Used  . Tobacco comment: off and on smoking for the 20 yrs  Substance and Sexual Activity  . Alcohol use: Yes    Comment: drink or two a day- mixed drink  . Drug use: No  . Sexual activity: Yes    Comment: lives with wife, still working, no dietary restrictions, continues to exercise  intermittently  Other Topics Concern  . Not on file  Social History Narrative  . Not on file   Social Determinants of Health   Financial Resource Strain: Low Risk   . Difficulty of Paying Living Expenses: Not hard at all  Food Insecurity:   . Worried About Charity fundraiser in the Last Year:   . Arboriculturist in the Last Year:   Transportation Needs: No Transportation Needs  . Lack of Transportation (Medical): No  . Lack of Transportation (Non-Medical): No  Physical Activity:   . Days of Exercise per Week:   . Minutes of Exercise per Session:   Stress:   . Feeling of Stress :   Social Connections:   . Frequency of Communication with Friends and Family:   . Frequency of Social Gatherings with Friends and Family:   . Attends Religious Services:   . Active Member of Clubs or Organizations:   . Attends Archivist Meetings:   Marland Kitchen Marital Status:     No Known Allergies   Current Outpatient Medications:  .  atorvastatin (LIPITOR) 20 MG tablet, TAKE 1/2 TABLET BY MOUTH DAILY AT 6 PM, Disp: 45 tablet, Rfl: 0 .  busPIRone (BUSPAR) 7.5 MG tablet, 1-2 TAB BY MOUTH 3 TIMES A DAY FOR ANXIETY, Disp: 540 tablet, Rfl: 1 .  cetirizine (ZYRTEC) 10 MG tablet, Take 10 mg by mouth daily. In the morning, Disp: , Rfl:  .  diazepam (VALIUM) 10 MG tablet, TAKE 1 TABLET(10 MG) BY MOUTH AT BEDTIME AS NEEDED, Disp: 30 tablet, Rfl: 1 .  doxycycline (VIBRA-TABS) 100 MG tablet, Take 1 tablet (100 mg total) by mouth 2 (two) times daily., Disp: 60 tablet, Rfl: 3 .  famotidine (PEPCID) 40 MG tablet, TAKE 1 TABLET BY MOUTH EVERYDAY AT BEDTIME, Disp: 90 tablet, Rfl: 1 .  FERRETTS 325 (106 Fe) MG TABS tablet, TAKE 1 TABLET (106 MG OF IRON TOTAL) BY MOUTH DAILY., Disp: 30 tablet, Rfl: 2 .  gabapentin (NEURONTIN) 300 MG capsule, Take 1 capsule (300 mg total) by mouth 3 (three) times daily. (Patient  not taking: Reported on 10/08/2019), Disp: 270 capsule, Rfl: 3 .  glucose blood (CONTOUR NEXT TEST) test  strip, Check blood sugar once daily, Disp: 100 each, Rfl: 12 .  HYDROcodone-acetaminophen (NORCO/VICODIN) 5-325 MG tablet, Take 1 tablet by mouth every 6 (six) hours as needed for moderate pain., Disp: 30 tablet, Rfl: 0 .  ibuprofen (ADVIL) 600 MG tablet, TAKE 1 TABLET BY MOUTH EVERY 8 (EIGHT) HOURS AS NEEDED FOR MILD PAIN OR MODERATE PAIN., Disp: 90 tablet, Rfl: 0 .  lisinopril (ZESTRIL) 5 MG tablet, TAKE 1 TABLET BY MOUTH TWICE A DAY, Disp: 180 tablet, Rfl: 1 .  meclizine (ANTIVERT) 12.5 MG tablet, Take 1 tablet (12.5 mg total) by mouth 3 (three) times daily as needed for dizziness., Disp: 30 tablet, Rfl: 0 .  metFORMIN (GLUCOPHAGE) 500 MG tablet, Take 1 tablet (500 mg total) by mouth 2 (two) times daily with a meal., Disp: 90 tablet, Rfl: 3 .  tamsulosin (FLOMAX) 0.4 MG CAPS capsule, TAKE TWO CAPSULES BY MOUTH DAILY , Disp: 180 capsule, Rfl: 0 .  testosterone cypionate (DEPOTESTOSTERONE CYPIONATE) 200 MG/ML injection, Inject 1 mL (200 mg total) into the muscle every 14 (fourteen) days., Disp: 1 mL, Rfl: 5 .  tiZANidine (ZANAFLEX) 4 MG tablet, TAKE 1.5 TABLETS (6 MG TOTAL) BY MOUTH EVERY 6 (SIX) HOURS AS NEEDED FOR MUSCLE SPASMS., Disp: 90 tablet, Rfl: 1 .  venlafaxine XR (EFFEXOR-XR) 150 MG 24 hr capsule, TAKE ONE CAPSULE BY MOUTH IN THE MORNING with breakfast, Disp: 90 capsule, Rfl: 0  Current Facility-Administered Medications:  .  cefTRIAXone (ROCEPHIN) 2 g in dextrose 5 % 50 mL IVPB, 2 g, Intravenous, Q24H, Powers, Evern Core, MD .  testosterone cypionate (DEPOTESTOSTERONE CYPIONATE) injection 200 mg, 200 mg, Intramuscular, Q14 Days, Saguier, Edward, PA-C, 200 mg at 07/16/19 0942 .  testosterone cypionate (DEPOTESTOSTERONE CYPIONATE) injection 200 mg, 200 mg, Intramuscular, Q14 Days, Saguier, Edward, PA-C, 200 mg at 10/03/19 1026   Review of Systems  Constitutional: Positive for diaphoresis and fatigue. Negative for activity change, appetite change, chills, fever and unexpected weight change.    HENT: Negative for congestion, rhinorrhea, sinus pressure, sneezing, sore throat and trouble swallowing.   Eyes: Negative for photophobia and visual disturbance.  Respiratory: Negative for cough, chest tightness, shortness of breath, wheezing and stridor.   Cardiovascular: Negative for chest pain, palpitations and leg swelling.  Gastrointestinal: Negative for abdominal distention, abdominal pain, anal bleeding, blood in stool, constipation, diarrhea, nausea and vomiting.  Genitourinary: Negative for difficulty urinating, dysuria, flank pain and hematuria.  Musculoskeletal: Positive for arthralgias and back pain. Negative for gait problem, joint swelling and myalgias.  Skin: Negative for color change, pallor, rash and wound.  Neurological: Positive for dizziness. Negative for tremors, weakness and light-headedness.  Hematological: Negative for adenopathy. Does not bruise/bleed easily.  Psychiatric/Behavioral: Negative for agitation, behavioral problems, confusion, decreased concentration, dysphoric mood and sleep disturbance.       Objective:   Physical Exam Constitutional:      General: He is not in acute distress.    Appearance: Normal appearance. He is well-developed. He is not ill-appearing or diaphoretic.  HENT:     Head: Normocephalic and atraumatic.     Right Ear: Hearing and external ear normal.     Left Ear: Hearing and external ear normal.     Nose: No nasal deformity or rhinorrhea.  Eyes:     General: No scleral icterus.    Extraocular Movements: Extraocular movements intact.     Conjunctiva/sclera: Conjunctivae normal.  Right eye: Right conjunctiva is not injected.     Left eye: Left conjunctiva is not injected.  Neck:     Vascular: No JVD.  Cardiovascular:     Rate and Rhythm: Normal rate and regular rhythm.     Heart sounds: S1 normal and S2 normal. No friction rub.  Pulmonary:     Effort: Pulmonary effort is normal. No respiratory distress.  Abdominal:      General: There is no distension.     Palpations: Abdomen is soft.  Musculoskeletal:        General: Normal range of motion.     Right shoulder: Normal.     Left shoulder: Normal.     Cervical back: Normal range of motion and neck supple.     Right hip: Normal.     Left hip: Normal.     Right knee: Normal.     Left knee: Normal.  Lymphadenopathy:     Head:     Right side of head: No submandibular, preauricular or posterior auricular adenopathy.     Left side of head: No submandibular, preauricular or posterior auricular adenopathy.     Cervical: No cervical adenopathy.     Right cervical: No superficial or deep cervical adenopathy.    Left cervical: No superficial or deep cervical adenopathy.  Skin:    General: Skin is warm and dry.     Coloration: Skin is not pale.     Findings: No abrasion, bruising, ecchymosis, erythema, lesion or rash.     Nails: There is no clubbing.  Neurological:     General: No focal deficit present.     Mental Status: He is alert and oriented to person, place, and time.     Sensory: No sensory deficit.     Coordination: Coordination normal.     Gait: Gait normal.  Psychiatric:        Attention and Perception: He is attentive.        Mood and Affect: Mood normal.        Speech: Speech normal.        Behavior: Behavior normal. Behavior is cooperative.        Thought Content: Thought content normal.        Judgment: Judgment normal.           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  I am bothered if this has developed despite him being on a highly bioavailable antibiotic.  That being said is Dr. Louanne Skye points out he did not have an MRI in the summer he has had a CT in the interim which did not show these findings but they are not a sensitive modality.  Also bothered by his symptoms of feeling  unwell dizzy and feeling flushed.  I will check labs today and also check blood cultures in case he is becoming systemically ill though I do not think he likely be overtly bacteremic at this point in time I just want to double check.  In the interim going to talk to Dr. Louanne Skye and see if radiology can biopsy a disc base for culture.  In anticipation of that I will have him hold his doxycycline.  I am planning on placing a PICC line provided blood cultures remain negative that I draw today and pursuing systemic IV antibiotics.  If radiology can obtain a sample for culture I  would use that to help guide my therapy.  If they cannot I would change him to ceftriaxone IV 2 g daily and have him go through another 6 weeks with repeat imaging of the spine in the interim.  Question of alcoholism he tells me that he has 1-2 drinks every day to every other day but not much beyond that.  I spent greater than 40 minutes with the patient including greater than 50% of time in face to face counsel of the patient during plan of action and in coordination of his care with Dr. Louanne Skye

## 2019-10-15 NOTE — Telephone Encounter (Signed)
Medication sent.

## 2019-10-17 ENCOUNTER — Telehealth: Payer: Self-pay

## 2019-10-17 ENCOUNTER — Other Ambulatory Visit: Payer: Self-pay

## 2019-10-17 ENCOUNTER — Other Ambulatory Visit (HOSPITAL_COMMUNITY): Payer: Self-pay | Admitting: Infectious Disease

## 2019-10-17 ENCOUNTER — Other Ambulatory Visit: Payer: Self-pay | Admitting: Specialist

## 2019-10-17 ENCOUNTER — Ambulatory Visit (INDEPENDENT_AMBULATORY_CARE_PROVIDER_SITE_OTHER): Payer: Medicare Other

## 2019-10-17 ENCOUNTER — Telehealth: Payer: Self-pay | Admitting: Infectious Disease

## 2019-10-17 DIAGNOSIS — R7989 Other specified abnormal findings of blood chemistry: Secondary | ICD-10-CM

## 2019-10-17 DIAGNOSIS — M4624 Osteomyelitis of vertebra, thoracic region: Secondary | ICD-10-CM

## 2019-10-17 MED ORDER — TESTOSTERONE CYPIONATE 200 MG/ML IM SOLN
200.0000 mg | Freq: Once | INTRAMUSCULAR | Status: AC
  Administered 2019-10-17: 200 mg via INTRAMUSCULAR

## 2019-10-17 NOTE — Telephone Encounter (Signed)
I would like him to restart his doxycycline for now and I want to arrange for a PICC line to be placed so that he can start him on IV ceftriaxone.  I do make sure his blood cultures that I drew are without growth prior to placing the PICC line

## 2019-10-17 NOTE — Telephone Encounter (Signed)
Per MD called IR to schedule picc placement. Is scheduled for 4/6 at 2 pm. Informed Caryl Pina appt might be canceled pending on blood cultures. Patient was called regarding appointment. Understands appt might be canceled pending on blood cultures.  [place]

## 2019-10-17 NOTE — Telephone Encounter (Signed)
Ok to rF?

## 2019-10-17 NOTE — Progress Notes (Addendum)
Patient presents to the office today for testosterone per  Gillis Santa order.  Testosterone cypionate 200 mg/ml , Left glut. IM was administrated.   Agree with administration.  Mackie Pai, PA-C

## 2019-10-17 NOTE — Telephone Encounter (Signed)
error 

## 2019-10-18 ENCOUNTER — Other Ambulatory Visit: Payer: Self-pay | Admitting: Specialist

## 2019-10-21 ENCOUNTER — Ambulatory Visit (INDEPENDENT_AMBULATORY_CARE_PROVIDER_SITE_OTHER): Payer: Medicare Other | Admitting: Specialist

## 2019-10-21 ENCOUNTER — Encounter: Payer: Self-pay | Admitting: Specialist

## 2019-10-21 ENCOUNTER — Other Ambulatory Visit: Payer: Self-pay

## 2019-10-21 VITALS — BP 150/76 | HR 77 | Ht 70.0 in | Wt 195.0 lb

## 2019-10-21 DIAGNOSIS — T84498A Other mechanical complication of other internal orthopedic devices, implants and grafts, initial encounter: Secondary | ICD-10-CM | POA: Diagnosis not present

## 2019-10-21 DIAGNOSIS — M40294 Other kyphosis, thoracic region: Secondary | ICD-10-CM | POA: Diagnosis not present

## 2019-10-21 DIAGNOSIS — M4624 Osteomyelitis of vertebra, thoracic region: Secondary | ICD-10-CM

## 2019-10-21 DIAGNOSIS — M4804 Spinal stenosis, thoracic region: Secondary | ICD-10-CM | POA: Diagnosis not present

## 2019-10-21 DIAGNOSIS — M546 Pain in thoracic spine: Secondary | ICD-10-CM | POA: Diagnosis not present

## 2019-10-21 LAB — CULTURE, BLOOD (SINGLE)
MICRO NUMBER:: 10307496
MICRO NUMBER:: 10307497
Result:: NO GROWTH
Result:: NO GROWTH
SPECIMEN QUALITY:: ADEQUATE
SPECIMEN QUALITY:: ADEQUATE

## 2019-10-21 LAB — COMPLETE METABOLIC PANEL WITH GFR
AG Ratio: 1.7 (calc) (ref 1.0–2.5)
ALT: 17 U/L (ref 9–46)
AST: 25 U/L (ref 10–35)
Albumin: 4 g/dL (ref 3.6–5.1)
Alkaline phosphatase (APISO): 98 U/L (ref 35–144)
BUN: 17 mg/dL (ref 7–25)
CO2: 30 mmol/L (ref 20–32)
Calcium: 9.3 mg/dL (ref 8.6–10.3)
Chloride: 100 mmol/L (ref 98–110)
Creat: 0.71 mg/dL (ref 0.70–1.18)
GFR, Est African American: 106 mL/min/{1.73_m2} (ref >=60)
GFR, Est Non African American: 91 mL/min/{1.73_m2} (ref >=60)
Globulin: 2.4 g/dL (calc) (ref 1.9–3.7)
Glucose, Bld: 143 mg/dL — ABNORMAL HIGH (ref 65–99)
Potassium: 4.6 mmol/L (ref 3.5–5.3)
Sodium: 136 mmol/L (ref 135–146)
Total Bilirubin: 0.6 mg/dL (ref 0.2–1.2)
Total Protein: 6.4 g/dL (ref 6.1–8.1)

## 2019-10-21 LAB — CBC WITH DIFFERENTIAL/PLATELET
Absolute Monocytes: 703 cells/uL (ref 200–950)
Basophils Absolute: 43 cells/uL (ref 0–200)
Basophils Relative: 0.6 %
Eosinophils Absolute: 121 cells/uL (ref 15–500)
Eosinophils Relative: 1.7 %
HCT: 34.4 % — ABNORMAL LOW (ref 38.5–50.0)
Hemoglobin: 11.5 g/dL — ABNORMAL LOW (ref 13.2–17.1)
Lymphs Abs: 454 cells/uL — ABNORMAL LOW (ref 850–3900)
MCH: 29.3 pg (ref 27.0–33.0)
MCHC: 33.4 g/dL (ref 32.0–36.0)
MCV: 87.5 fL (ref 80.0–100.0)
MPV: 9.1 fL (ref 7.5–12.5)
Monocytes Relative: 9.9 %
Neutro Abs: 5779 cells/uL (ref 1500–7800)
Neutrophils Relative %: 81.4 %
Platelets: 392 10*3/uL (ref 140–400)
RBC: 3.93 10*6/uL — ABNORMAL LOW (ref 4.20–5.80)
RDW: 13.9 % (ref 11.0–15.0)
Total Lymphocyte: 6.4 %
WBC: 7.1 10*3/uL (ref 3.8–10.8)

## 2019-10-21 LAB — SEDIMENTATION RATE: Sed Rate: 31 mm/h — ABNORMAL HIGH (ref 0–20)

## 2019-10-21 LAB — C-REACTIVE PROTEIN: CRP: 37.5 mg/L — ABNORMAL HIGH (ref <8.0)

## 2019-10-21 MED ORDER — HYDROCODONE-ACETAMINOPHEN 5-325 MG PO TABS: 1.0000 | ORAL_TABLET | Freq: Four times a day (QID) | ORAL | 0 refills | Status: DC | PRN

## 2019-10-21 NOTE — Telephone Encounter (Signed)
Called patient to inform him MD recommends continuing with picc placement. Patient is okay with this.  [facility] is aware of plan will have everything set up  Aundria Rud, [place]

## 2019-10-21 NOTE — Telephone Encounter (Signed)
JN 

## 2019-10-21 NOTE — Progress Notes (Signed)
Office Visit Note   Patient: Luis Brown           Date of Birth: 1946-02-28           MRN: [id number] Visit Date: 10/21/2019              Requested by: Mackie Pai, PA-C [place],  [place] [address] PCP: Mackie Pai, PA-C   Assessment & Plan: Visit Diagnoses: No diagnosis found.  Plan: Avoid frequent bending and stooping  No lifting greater than 10 lbs. May use ice or moist heat for pain. Weight loss is of benefit. Best medication for lumbar disc disease is arthritis medications like motrin, celebrex and naprosyn. Exercise is important to improve your indurance and does allow people to function better inspite of back pain.    Follow-Up Instructions: Return in about 3 weeks (around 11/11/2019).   Orders:  No orders of the defined types were placed in this encounter.  No orders of the defined types were placed in this encounter.     Procedures: No procedures performed   Clinical Data: No additional findings.   Subjective: Chief Complaint  Patient presents with  . Spine - Follow-up    MRI Review    74 year old male with history of thoracolumbar fusion had loosening of hardware T10 and extension of fusion to T5 in 01/2019. He has the fasteners fail  In August and returned to the [facility] in Aug. 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 He started antibiotics and has been on doxycyline and more recently began having mid thoracic pain. The left T5 pedicle screw broke in the body of T5 and a follow up MRI shows discitis at the T4-5 level above the lumbar fusion and a probable epidural abscess.    Review of Systems  Constitutional: Negative.   HENT: Negative.   Eyes: Negative.   Respiratory: Negative.   Cardiovascular: Negative.   Gastrointestinal: Negative.   Endocrine: Negative.   Genitourinary: Negative.   Musculoskeletal: Positive for back  pain.  Skin: Negative.   Allergic/Immunologic: Negative.   Neurological: Positive for weakness and numbness.  Hematological: Negative.   Psychiatric/Behavioral: Negative.  Negative for agitation, behavioral problems, confusion, decreased concentration, dysphoric mood, hallucinations, self-injury, sleep disturbance and suicidal ideas. The patient is not nervous/anxious and is not hyperactive.      Objective: Vital Signs: BP (!) 150/76 (BP Location: Left Arm, Patient Position: Sitting)   Pulse 77   Ht 5\' 10"  (1.778 m)   Wt 195 lb (88.5 kg)   BMI 27.98 kg/m   Physical Exam Constitutional:      Appearance: He is well-developed.  HENT:     Head: Normocephalic and atraumatic.  Eyes:     Pupils: Pupils are equal, round, and reactive to light.  Pulmonary:     Effort: Pulmonary effort is normal.     Breath sounds: Normal breath sounds.  Abdominal:     General: Bowel sounds are normal.     Palpations: Abdomen is soft.  Musculoskeletal:     Cervical back: Normal range of motion and neck supple.     Lumbar back: Positive right straight leg raise test and positive left straight leg raise test.  Skin:    General: Skin is warm and dry.  Neurological:     Mental Status: He is alert and oriented to person, place, and time.  Psychiatric:  Behavior: Behavior normal.        Thought Content: Thought content normal.        Judgment: Judgment normal.     Back Exam   Tenderness  The patient is experiencing tenderness in the lumbar.  Range of Motion  Extension: abnormal  Flexion: abnormal  Lateral bend right: abnormal  Lateral bend left: abnormal  Rotation right: abnormal  Rotation left: abnormal   Tests  Straight leg raise right: positive Straight leg raise left: positive  Reflexes  Patellar: 2/4 Achilles: 2/4 Babinski's sign: normal   Other  Erythema: no back redness Scars: absent      Specialty Comments:  No specialty comments available.  Imaging: No  results found.   PMFS History: Patient Active Problem List   Diagnosis Date Noted  . 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 Medication monitoring encounter 08/26/2019  . Peripheral neuropathy 08/07/2019  . Alcohol abuse 05/09/2019  . Hyponatremia 05/05/2019  . 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 Fusion of spine of thoracolumbar region 03/12/2019  . Status post thoracic spinal fusion 02/12/2019  . 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 Thoracic spinal stenosis 04/17/2017  . Painful orthopaedic hardware ([place]) 04/17/2017  . 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 ([place]) 04/17/2017  . Low back pain radiating to both legs 01/16/2017  . Esophageal reflux 02/10/2015  . Medicare annual wellness visit, subsequent 02/10/2015  . Personal history of colonic polyps 10/27/2012  . 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  . BPH (benign prostatic hyperplasia) 04/23/2012  . ED (erectile dysfunction) 04/23/2012  . Allergic state 10/25/2011  . Anxiety and depression 10/25/2011  . Asthma   . Diabetes mellitus without complication ([place])   . Hyperlipidemia   . Overweight   . Insomnia   . Fatigue   . Preventative health care   . DDD (degenerative disc disease), lumbosacral   . Thoracic degenerative disc disease   . HTN (hypertension)    Past Medical History:  Diagnosis Date  . Acute pharyngitis 10/21/2013  . Allergy    grass and pollen  . Anxiety and depression 10/25/2011  . BPH (benign prostatic hyperplasia) 04/23/2012  . Chicken pox as a child  . DDD (degenerative disc disease)    cervical responds to steroid injections and low back required surgery  . DDD (degenerative disc disease), lumbosacral   . Diabetes mellitus    pre  . Dyspnea   . ED (erectile dysfunction) 04/23/2012  . Elevated BP   . 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 Esophageal reflux 02/10/2015  . Fatigue   . HTN (hypertension)   .  Hyperglycemia    preDM   . Hyperlipidemia   . Insomnia   . Low back pain radiating to both legs 01/16/2017  . Measles as a child  . Medicare annual wellness visit, subsequent 02/10/2015  . Overweight(278.02)   . Peripheral neuropathy 08/07/2019  . Personal history of colonic polyps 10/27/2012   Follows with [facility]  . Pneumonia    " walking"  . Preventative health care   . 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  . Wears glasses     Family History  Problem Relation Age of Onset  . Hypertension Mother   . Diabetes Mother        type 2  . Cancer Mother 22       breast in remission  . Emphysema Father        smoker  . COPD Father  smoker  . Stroke Father 41       mini  . Heart disease Father   . Hypertension Sister   . Hyperlipidemia Sister   . Scoliosis Sister   . Osteoporosis Sister   . Hypertension Maternal Grandmother   . Scoliosis Maternal Grandmother   . Heart disease Maternal Grandfather   . Heart disease Paternal Grandfather        smoker  . Heart disease Daughter     Past Surgical History:  Procedure Laterality Date  . BACK SURGERY  2012 and 1994   Dr Margret Chance, screws and cage in low back  . EYE SURGERY Bilateral    2016  . HARDWARE REVISION  03/12/2019   REVISION OF HARDWARE THORACIC TEN-THORACIC ELEVEN WITH APPLICATION OF ADDITIONAL RODS AND ROD SLEEVES THORACIC EIGHT-THORACIC TWELVE 12-LUMBAR ONE (N/A )  . LAMINECTOMY  02/12/2019   DECOMPRESSIVE LAMINECTOMY THORACIC NINE-THORACIC TEN AND THORACIC TEN-THORACIC ELEVEN, EXTENSION OF THORACOLUMBAR FUSION FROM THORACIC TEN TO THORACIC FIVE, LOCAL BONE GRAFT, ALLOGRAFT AND VIVIGEN (N/A)  . SPINAL FUSION N/A 02/12/2019   Procedure: DECOMPRESSIVE LAMINECTOMY THORACIC NINE-THORACIC TEN AND THORACIC TEN-THORACIC ELEVEN, EXTENSION OF THORACOLUMBAR FUSION FROM THORACIC TEN TO THORACIC FIVE, LOCAL BONE GRAFT, ALLOGRAFT AND VIVIGEN;  Surgeon: Jessy Oto, MD;  Location: [facility];  Service: Orthopedics;   Laterality: N/A;  DECOMPRESSIVE LAMINECTOMY THORACIC NINE-THORACIC TEN AND THORACIC TEN-THORACIC ELEVEN, EXTENSION OF TH  . SPINAL FUSION N/A 03/12/2019   Procedure: REVISION OF HARDWARE THORACIC TEN-THORACIC ELEVEN WITH APPLICATION OF ADDITIONAL RODS AND ROD SLEEVES THORACIC EIGHT-THORACIC TWELVE 12-LUMBAR ONE;  Surgeon: Jessy Oto, MD;  Location: [facility];  Service: Orthopedics;  Laterality: N/A;  . TONSILLECTOMY    . TONSILLECTOMY     as child  . torn rotator cuff  2010   right   Social History   Occupational History  . Not on file  Tobacco Use  . Smoking status: Former Smoker    Packs/day: 1.00    Years: 20.00    Pack years: 20.00    Types: Cigarettes    Quit date: 07/18/1989    Years since quitting: 30.2  . Smokeless tobacco: Never Used  . Tobacco comment: off and on smoking for the 20 yrs  Substance and Sexual Activity  . Alcohol use: Yes    Comment: drink or two a day- mixed drink  . Drug use: No  . Sexual activity: Yes    Comment: lives with wife, still working, no dietary restrictions, continues to exercise intermittently

## 2019-10-21 NOTE — Telephone Encounter (Signed)
Yes if that is required. He HAS had this antibiotic via home health before

## 2019-10-21 NOTE — Patient Instructions (Signed)
Avoid frequent bending and stooping  No lifting greater than 10 lbs. May use ice or moist heat for pain. Exercise is important to improve your indurance and does allow people to function better inspite of back pain. Scapula retraction exercises. Restart antibiotics due to a adjacent level T4-5 discitis with epidural abscess.

## 2019-10-22 ENCOUNTER — Other Ambulatory Visit (HOSPITAL_COMMUNITY): Payer: Self-pay | Admitting: Infectious Disease

## 2019-10-22 ENCOUNTER — Ambulatory Visit (HOSPITAL_COMMUNITY)
Admission: RE | Admit: 2019-10-22 | Discharge: 2019-10-22 | Disposition: A | Discharge status: Home / Self Care | Payer: Medicare Other | Source: Ambulatory Visit | Attending: Infectious Disease | Admitting: Infectious Disease

## 2019-10-22 DIAGNOSIS — M4624 Osteomyelitis of vertebra, thoracic region: Secondary | ICD-10-CM

## 2019-10-22 DIAGNOSIS — Z452 Encounter for adjustment and management of vascular access device: Secondary | ICD-10-CM | POA: Diagnosis not present

## 2019-10-22 MED ORDER — HEPARIN SOD (PORK) LOCK FLUSH 100 UNIT/ML IV SOLN
INTRAVENOUS | Status: AC
  Filled 2019-10-22: qty 5

## 2019-10-22 MED ORDER — HEPARIN SOD (PORK) LOCK FLUSH 100 UNIT/ML IV SOLN
INTRAVENOUS | Status: DC | PRN
  Administered 2019-10-22: 500 [IU] via INTRAVENOUS

## 2019-10-22 MED ORDER — LIDOCAINE HCL 1 % IJ SOLN
INTRAMUSCULAR | Status: AC
  Filled 2019-10-22: qty 20

## 2019-10-22 MED ORDER — LIDOCAINE HCL 1 % IJ SOLN
INTRAMUSCULAR | Status: DC | PRN
  Administered 2019-10-22: 5 mL

## 2019-10-22 NOTE — Procedures (Signed)
Basillic vein single lumen PICC placed without immediate complications. Length 38 cm tip SVC / right atrial junction. Okay to use. Medication used - 1& lidocaine and subcutaneous tissues. EBL < 2 ml.

## 2019-10-23 ENCOUNTER — Other Ambulatory Visit: Payer: Self-pay | Admitting: Specialist

## 2019-10-23 DIAGNOSIS — M4325 Fusion of spine, thoracolumbar region: Secondary | ICD-10-CM | POA: Diagnosis not present

## 2019-10-23 DIAGNOSIS — M4635 Infection of intervertebral disc (pyogenic), thoracolumbar region: Secondary | ICD-10-CM | POA: Diagnosis not present

## 2019-10-24 DIAGNOSIS — M4635 Infection of intervertebral disc (pyogenic), thoracolumbar region: Secondary | ICD-10-CM | POA: Diagnosis not present

## 2019-10-24 DIAGNOSIS — M4325 Fusion of spine, thoracolumbar region: Secondary | ICD-10-CM | POA: Diagnosis not present

## 2019-10-25 ENCOUNTER — Other Ambulatory Visit: Payer: Self-pay | Admitting: Specialist

## 2019-10-25 DIAGNOSIS — M4635 Infection of intervertebral disc (pyogenic), thoracolumbar region: Secondary | ICD-10-CM | POA: Diagnosis not present

## 2019-10-25 DIAGNOSIS — M4325 Fusion of spine, thoracolumbar region: Secondary | ICD-10-CM | POA: Diagnosis not present

## 2019-10-26 DIAGNOSIS — M4635 Infection of intervertebral disc (pyogenic), thoracolumbar region: Secondary | ICD-10-CM | POA: Diagnosis not present

## 2019-10-26 DIAGNOSIS — M4325 Fusion of spine, thoracolumbar region: Secondary | ICD-10-CM | POA: Diagnosis not present

## 2019-10-27 ENCOUNTER — Other Ambulatory Visit: Payer: Self-pay | Admitting: Medical

## 2019-10-27 DIAGNOSIS — M4325 Fusion of spine, thoracolumbar region: Secondary | ICD-10-CM | POA: Diagnosis not present

## 2019-10-27 DIAGNOSIS — M4635 Infection of intervertebral disc (pyogenic), thoracolumbar region: Secondary | ICD-10-CM | POA: Diagnosis not present

## 2019-10-28 ENCOUNTER — Telehealth: Payer: Self-pay | Admitting: Medical

## 2019-10-28 DIAGNOSIS — M4325 Fusion of spine, thoracolumbar region: Secondary | ICD-10-CM | POA: Diagnosis not present

## 2019-10-28 DIAGNOSIS — M4635 Infection of intervertebral disc (pyogenic), thoracolumbar region: Secondary | ICD-10-CM | POA: Diagnosis not present

## 2019-10-28 MED ORDER — HYDROCODONE-ACETAMINOPHEN 5-325 MG PO TABS: 1.0000 | ORAL_TABLET | Freq: Four times a day (QID) | ORAL | 0 refills | Status: DC | PRN

## 2019-10-28 NOTE — Telephone Encounter (Addendum)
Requesting: valium Contract:N/A UDS:N/A Last Visit:3.23.21 Next Visit:N/A Last Refill:06/05/19  Please Advise  I refilled but want him to sign contract and give uds. Can you get him scheduled to do both when he comes in for his next testosterone injection.

## 2019-10-28 NOTE — Telephone Encounter (Signed)
Nitka pt 

## 2019-10-28 NOTE — Telephone Encounter (Signed)
Mr Foerst would like to remind that his FERRETTS 325 (106 Fe) MG TABS tablet [id number]  Has increased to 2 pills a day and want to make sure that it's updated for his refill on this month.  I also updated his Pharm[facility] as [facility] [address]. He no longer wanted to go to [facility].

## 2019-10-29 ENCOUNTER — Encounter: Payer: Self-pay | Admitting: Specialist

## 2019-10-29 ENCOUNTER — Ambulatory Visit (INDEPENDENT_AMBULATORY_CARE_PROVIDER_SITE_OTHER): Payer: Medicare Other

## 2019-10-29 ENCOUNTER — Other Ambulatory Visit: Payer: Self-pay

## 2019-10-29 ENCOUNTER — Other Ambulatory Visit (HOSPITAL_COMMUNITY)
Admission: RE | Admit: 2019-10-29 | Discharge: 2019-10-29 | Disposition: A | Discharge status: Home / Self Care | Payer: Medicare Other | Source: Other Acute Inpatient Hospital | Attending: Internal Medicine | Admitting: Internal Medicine

## 2019-10-29 DIAGNOSIS — E349 Endocrine disorder, unspecified: Secondary | ICD-10-CM

## 2019-10-29 DIAGNOSIS — R7989 Other specified abnormal findings of blood chemistry: Secondary | ICD-10-CM

## 2019-10-29 DIAGNOSIS — E139 Other specified diabetes mellitus without complications: Secondary | ICD-10-CM | POA: Diagnosis not present

## 2019-10-29 DIAGNOSIS — N400 Enlarged prostate without lower urinary tract symptoms: Secondary | ICD-10-CM | POA: Diagnosis present

## 2019-10-29 DIAGNOSIS — M4325 Fusion of spine, thoracolumbar region: Secondary | ICD-10-CM | POA: Diagnosis not present

## 2019-10-29 DIAGNOSIS — M4635 Infection of intervertebral disc (pyogenic), thoracolumbar region: Secondary | ICD-10-CM | POA: Diagnosis not present

## 2019-10-29 LAB — CBC WITH DIFFERENTIAL/PLATELET
Abs Immature Granulocytes: 0.01 10*3/uL (ref 0.00–0.07)
Basophils Absolute: 0 10*3/uL (ref 0.0–0.1)
Basophils Relative: 1 %
Eosinophils Absolute: 0.2 10*3/uL (ref 0.0–0.5)
Eosinophils Relative: 3 %
HCT: 41.3 % (ref 39.0–52.0)
Hemoglobin: 13 g/dL (ref 13.0–17.0)
Immature Granulocytes: 0 %
Lymphocytes Relative: 7 %
Lymphs Abs: 0.4 10*3/uL — ABNORMAL LOW (ref 0.7–4.0)
MCH: 28.5 pg (ref 26.0–34.0)
MCHC: 31.5 g/dL (ref 30.0–36.0)
MCV: 90.6 fL (ref 80.0–100.0)
Monocytes Absolute: 1.1 10*3/uL — ABNORMAL HIGH (ref 0.1–1.0)
Monocytes Relative: 19 %
Neutro Abs: 4.3 10*3/uL (ref 1.7–7.7)
Neutrophils Relative %: 70 %
Platelets: 353 10*3/uL (ref 150–400)
RBC: 4.56 MIL/uL (ref 4.22–5.81)
RDW: 15.7 % — ABNORMAL HIGH (ref 11.5–15.5)
WBC: 6 10*3/uL (ref 4.0–10.5)
nRBC: 0 % (ref 0.0–0.2)

## 2019-10-29 LAB — SEDIMENTATION RATE: Sed Rate: 26 mm/hr — ABNORMAL HIGH (ref 0–16)

## 2019-10-29 LAB — C-REACTIVE PROTEIN: CRP: 7.3 mg/dL — ABNORMAL HIGH (ref <1.0)

## 2019-10-29 LAB — BASIC METABOLIC PANEL
Anion gap: 12 (ref 5–15)
BUN: 16 mg/dL (ref 8–23)
CO2: 28 mmol/L (ref 22–32)
Calcium: 9.4 mg/dL (ref 8.9–10.3)
Chloride: 99 mmol/L (ref 98–111)
Creatinine, Ser: 0.72 mg/dL (ref 0.61–1.24)
GFR calc Af Amer: >60 mL/min (ref >60)
GFR calc non Af Amer: >60 mL/min (ref >60)
Glucose, Bld: 162 mg/dL — ABNORMAL HIGH (ref 70–99)
Potassium: 4.2 mmol/L (ref 3.5–5.1)
Sodium: 139 mmol/L (ref 135–145)

## 2019-10-29 NOTE — Progress Notes (Addendum)
Patient here today for testosterone injection per Mackie Pai. Patient received injection in right upper outer quadrant IM. Patient tolerated well. Patient supplied his own medication.   Agree with administration of testosterone for low testosterone.  Mackie Pai, PA-C

## 2019-10-30 ENCOUNTER — Other Ambulatory Visit: Payer: Self-pay | Admitting: Specialist

## 2019-10-30 ENCOUNTER — Telehealth: Payer: Self-pay | Admitting: Medical

## 2019-10-30 DIAGNOSIS — M4635 Infection of intervertebral disc (pyogenic), thoracolumbar region: Secondary | ICD-10-CM | POA: Diagnosis not present

## 2019-10-30 DIAGNOSIS — M4325 Fusion of spine, thoracolumbar region: Secondary | ICD-10-CM | POA: Diagnosis not present

## 2019-10-30 MED ORDER — IBUPROFEN 600 MG PO TABS: 600.0000 mg | ORAL_TABLET | Freq: Three times a day (TID) | ORAL | 0 refills | Status: DC | PRN

## 2019-10-30 MED ORDER — METFORMIN HCL 500 MG PO TABS: 500.0000 mg | ORAL_TABLET | Freq: Two times a day (BID) | ORAL | 3 refills | Status: DC

## 2019-10-30 NOTE — Telephone Encounter (Signed)
Medication sent.

## 2019-10-30 NOTE — Telephone Encounter (Signed)
Medication:metFORMIN (GLUCOPHAGE) 500 MG tablet [id number]    Has the patient contacted their pharmacy? Yes.   (If no, request that the patient contact the pharmacy for the refill.) (If yes, when and what did the pharmacy advise?)  Preferred Pharmacy (with phone number or street name): [id number] [place], [place]  [address], [place] [address]  Phone:  [phone] Fax:  [phone]  DEA #:  [id number]  Agent: Please be advised that RX refills may take up to 3 business days. We ask that you follow-up with your pharmacy.

## 2019-10-31 ENCOUNTER — Encounter: Payer: Self-pay | Admitting: Specialist

## 2019-10-31 DIAGNOSIS — M4325 Fusion of spine, thoracolumbar region: Secondary | ICD-10-CM | POA: Diagnosis not present

## 2019-10-31 DIAGNOSIS — M4635 Infection of intervertebral disc (pyogenic), thoracolumbar region: Secondary | ICD-10-CM | POA: Diagnosis not present

## 2019-11-01 DIAGNOSIS — M4635 Infection of intervertebral disc (pyogenic), thoracolumbar region: Secondary | ICD-10-CM | POA: Diagnosis not present

## 2019-11-01 DIAGNOSIS — M4325 Fusion of spine, thoracolumbar region: Secondary | ICD-10-CM | POA: Diagnosis not present

## 2019-11-02 DIAGNOSIS — M4325 Fusion of spine, thoracolumbar region: Secondary | ICD-10-CM | POA: Diagnosis not present

## 2019-11-02 DIAGNOSIS — M4635 Infection of intervertebral disc (pyogenic), thoracolumbar region: Secondary | ICD-10-CM | POA: Diagnosis not present

## 2019-11-03 DIAGNOSIS — M4325 Fusion of spine, thoracolumbar region: Secondary | ICD-10-CM | POA: Diagnosis not present

## 2019-11-03 DIAGNOSIS — M4635 Infection of intervertebral disc (pyogenic), thoracolumbar region: Secondary | ICD-10-CM | POA: Diagnosis not present

## 2019-11-04 ENCOUNTER — Other Ambulatory Visit: Payer: Self-pay | Admitting: Specialist

## 2019-11-04 ENCOUNTER — Telehealth: Payer: Self-pay | Admitting: Medical

## 2019-11-04 ENCOUNTER — Telehealth: Payer: Self-pay

## 2019-11-04 DIAGNOSIS — M4325 Fusion of spine, thoracolumbar region: Secondary | ICD-10-CM | POA: Diagnosis not present

## 2019-11-04 DIAGNOSIS — M4635 Infection of intervertebral disc (pyogenic), thoracolumbar region: Secondary | ICD-10-CM | POA: Diagnosis not present

## 2019-11-04 MED ORDER — ALBUTEROL SULFATE HFA 108 (90 BASE) MCG/ACT IN AERS: 2.0000 | INHALATION_SPRAY | Freq: Four times a day (QID) | RESPIRATORY_TRACT | 0 refills | Status: DC | PRN

## 2019-11-04 NOTE — Telephone Encounter (Signed)
Rx albuterol sent to pt pharmacy. 

## 2019-11-04 NOTE — Telephone Encounter (Signed)
Opened to review 

## 2019-11-04 NOTE — Telephone Encounter (Signed)
Patient called in to see if PA Mackie Pai could send in a prescription for(ALBUTEROL SULFATE IN HAULER 53mcg    [facility] to [id number] [place], [place] - [place]  [address], [place] [address]  Phone:  [phone] Fax:  [phone]  DEA #:  [id number]

## 2019-11-04 NOTE — Telephone Encounter (Signed)
Rx was sent to pt pharmacy. But it looks like has not been on inhaler for years. I don't  see one recently used and on chart review former smoker.  So can you let me know why he wants refill. Is he real sob or wheezing a lot. If having to use albuterol frequently need to know.  Does he feel he needs appointment. Does he have 02 sat monitor at home? If so what is reading?

## 2019-11-04 NOTE — Telephone Encounter (Signed)
Inhaler was d/c in 2013 , please advise.

## 2019-11-05 ENCOUNTER — Other Ambulatory Visit: Payer: Self-pay

## 2019-11-05 ENCOUNTER — Encounter: Payer: Self-pay | Admitting: Infectious Disease

## 2019-11-05 ENCOUNTER — Other Ambulatory Visit: Payer: Self-pay | Admitting: Medical

## 2019-11-05 ENCOUNTER — Ambulatory Visit: Payer: Medicare Other | Admitting: Infectious Disease

## 2019-11-05 VITALS — BP 171/81 | HR 73 | Temp 98.0°F | Ht 70.0 in | Wt 187.0 lb

## 2019-11-05 DIAGNOSIS — M4635 Infection of intervertebral disc (pyogenic), thoracolumbar region: Secondary | ICD-10-CM | POA: Diagnosis not present

## 2019-11-05 DIAGNOSIS — M4624 Osteomyelitis of vertebra, thoracic region: Secondary | ICD-10-CM | POA: Diagnosis not present

## 2019-11-05 DIAGNOSIS — M5134 Other intervertebral disc degeneration, thoracic region: Secondary | ICD-10-CM

## 2019-11-05 DIAGNOSIS — M4325 Fusion of spine, thoracolumbar region: Secondary | ICD-10-CM | POA: Diagnosis not present

## 2019-11-05 NOTE — Telephone Encounter (Signed)
Patient states his allergies causes him to cough from time to time but he has no SOB or wheezing , so the inhaler is PRN .

## 2019-11-05 NOTE — Progress Notes (Signed)
Subjective:  Chief complaint:   Worsening back pain  t [name], male    DOB: July 28, 1945, 74 y.o.   MRN: [id number]  HPI  36 -year-old man with a history of degenerative disc disease and multiple spinal fusions who was initially seen by Dr. Prince Rome in our group.  He has a rather complex surgical history which in regards to his back began in 2015 Dr. Agapito Games performed an T8-L1 fusion to assist with left leg radicular pain and numbness.  Despite multiple visits to see his surgeon at [facility] postoperatively, the patient states that his left leg numbness and direct low thoracic back pain returned within months after his surgery.  Imaging in 2018 showed that he had an L5-S1 rod breakage and loosening of the pedicle screws at that level.  He sought a second opinion with Dr. Louanne Skye late in 2018.  In November 2018, he had a replacement of the L5-S1 fusion segment and bone grafting was performed simultaneously.  Per the operative note reviewed, the broken rod was left intact at that time.  He reports several partial or near falls following this procedure, but he denies any total falls or direct trauma to his back in the past several years.  As his back pain and left leg radicular symptoms gradually returned, further imaging was performed that suggested screw loosening at the T9-T11 section of his spinal fusion.  On February 12, 2019 he returned to the OR with Dr. Louanne Skye and was found to have screw loosening at the same levels, so his fusion was extended superiorly to T5 and bone grafting was performed.  Initially, the patient had a good postoperative course, but over weeks 2 and 3 postoperatively, he began developing pain in his back at his operative site, prompting plain films to be obtained.  X-rays showed a loss of fixation of the opposite left internal rod fastener at the site of his severe back pain and the development of acute loosening of the interval rod fasteners and loss of  fixation.  On March 12, 2019, he was taken back to the operating room where it was noted that his Harrington rod had loosened and nearly entirely migrated out of position.  A culture was obtained near the pedicle screw at T10 as a small amount of soft tissue with significant bone osteomalacia was observed.  Given concern for his spinal stability, his recently placed Harrington rods were removed and revised/replaced.  Operative cultures were followed and remained negative at the time of his hospital discharge on March 14, 2019.  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    He was then placed on ceftriaxone in October with improvement in his symptoms and his inflammatory markers.  He completed his the ceftriaxone and changed over to doxycycline.  He saw Dr. Linus Salmons in February and seemed to be doing relatively well.  Since then he did have some episodes of feeling poorly and feeling flushed and feeling like "something not right I think I am sick".   Dr. 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 Extensive ventral and dorsal epidural thickening and enhancement around the cord from T4 there approximately T6 compatible with epidural infection. And probable osteomyelitis of T6.  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  Dr. Louanne Skye called me  arrange for the patient be seen in clinic today.  On exam he appears stable and does not have any new neurological deficits.  Dr. Louanne Skye was going to be in touch with radiology to see if they might build a sample a disc for culture.  We obtained blood cultures [facility] and these were with no out any growth.  Interventional radiology could not aspirate the disc base for culture and we had been for initiated him on ceftriaxone on  April 6.  He is tolerating the antibiotics foot relatively well though he complains of some dark stools that he thinks might be due to the antibiotics.  He also continues to have back pain which sounds more severe than it was when I last saw him.  His inflammatory markers out are running down.  But I am not encouraged by his increased pain.   Past Medical History:  Diagnosis Date  . Acute pharyngitis 10/21/2013  . Allergy    grass and pollen  . Anxiety and depression 10/25/2011  . BPH (benign prostatic hyperplasia) 04/23/2012  . Chicken pox as a child  . DDD (degenerative disc disease)    cervical responds to steroid injections and low back required surgery  . DDD (degenerative disc disease), lumbosacral   . Diabetes mellitus    pre  . Dyspnea   . ED (erectile dysfunction) 04/23/2012  . Elevated BP   . 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 Esophageal reflux 02/10/2015  . Fatigue   . HTN (hypertension)   . Hyperglycemia    preDM   . Hyperlipidemia   . Insomnia   . Low back pain radiating to both legs 01/16/2017  . Measles as a child  . Medicare annual wellness visit, subsequent 02/10/2015  . Overweight(278.02)   . Peripheral neuropathy 08/07/2019  . Personal history of colonic polyps 10/27/2012   Follows with [facility]  . Pneumonia    " walking"  . Preventative health care   . 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  . Wears glasses     Past Surgical History:  Procedure Laterality Date  . BACK SURGERY  2012 and 1994   Dr Margret Chance, screws and cage in low back  . EYE SURGERY Bilateral    2016  . HARDWARE REVISION  03/12/2019   REVISION OF HARDWARE THORACIC TEN-THORACIC ELEVEN WITH APPLICATION OF ADDITIONAL RODS AND ROD SLEEVES THORACIC EIGHT-THORACIC TWELVE 12-LUMBAR ONE (N/A )  . LAMINECTOMY  02/12/2019   DECOMPRESSIVE LAMINECTOMY THORACIC NINE-THORACIC TEN AND THORACIC TEN-THORACIC ELEVEN, EXTENSION OF THORACOLUMBAR FUSION FROM THORACIC TEN TO THORACIC FIVE, LOCAL BONE GRAFT,  ALLOGRAFT AND VIVIGEN (N/A)  . SPINAL FUSION N/A 02/12/2019   Procedure: DECOMPRESSIVE LAMINECTOMY THORACIC NINE-THORACIC TEN AND THORACIC TEN-THORACIC ELEVEN, EXTENSION OF THORACOLUMBAR FUSION FROM THORACIC TEN TO THORACIC FIVE, LOCAL BONE GRAFT, ALLOGRAFT AND VIVIGEN;  Surgeon: Jessy Oto, MD;  Location: [facility];  Service: Orthopedics;  Laterality: N/A;  DECOMPRESSIVE LAMINECTOMY THORACIC NINE-THORACIC TEN AND THORACIC TEN-THORACIC ELEVEN, EXTENSION OF TH  . SPINAL FUSION N/A 03/12/2019   Procedure: REVISION OF HARDWARE THORACIC TEN-THORACIC ELEVEN WITH APPLICATION OF ADDITIONAL RODS AND ROD SLEEVES THORACIC EIGHT-THORACIC TWELVE 12-LUMBAR ONE;  Surgeon: Jessy Oto, MD;  Location: [place];  Service: Orthopedics;  Laterality: N/A;  . TONSILLECTOMY    . TONSILLECTOMY     as child  . torn rotator cuff  2010   right    Family History  Problem Relation Age of Onset  . Hypertension Mother   . Diabetes Mother  type 2  . Cancer Mother 71       breast in remission  . Emphysema Father        smoker  . COPD Father        smoker  . Stroke Father 50       mini  . Heart disease Father   . Hypertension Sister   . Hyperlipidemia Sister   . Scoliosis Sister   . Osteoporosis Sister   . Hypertension Maternal Grandmother   . Scoliosis Maternal Grandmother   . Heart disease Maternal Grandfather   . Heart disease Paternal Grandfather        smoker  . Heart disease Daughter       Social History   Socioeconomic History  . Marital status: Married    Spouse name: Not on file  . Number of children: Not on file  . Years of education: Not on file  . Highest education level: Not on file  Occupational History  . Not on file  Tobacco Use  . Smoking status: Former Smoker    Packs/day: 1.00    Years: 20.00    Pack years: 20.00    Types: Cigarettes    Quit date: 07/18/1989    Years since quitting: 30.3  . Smokeless tobacco: Never Used  . Tobacco comment: off and on smoking for the 20  yrs  Substance and Sexual Activity  . Alcohol use: Yes    Comment: drink or two a day- mixed drink  . Drug use: No  . Sexual activity: Yes    Comment: lives with wife, still working, no dietary restrictions, continues to exercise intermittently  Other Topics Concern  . Not on file  Social History Narrative  . Not on file   Social Determinants of Health   Financial Resource Strain: Low Risk   . Difficulty of Paying Living Expenses: Not hard at all  Food Insecurity:   . Worried About Charity fundraiser in the Last Year:   . Arboriculturist in the Last Year:   Transportation Needs: No Transportation Needs  . Lack of Transportation (Medical): No  . Lack of Transportation (Non-Medical): No  Physical Activity:   . Days of Exercise per Week:   . Minutes of Exercise per Session:   Stress:   . Feeling of Stress :   Social Connections:   . Frequency of Communication with Friends and Family:   . Frequency of Social Gatherings with Friends and Family:   . Attends Religious Services:   . Active Member of Clubs or Organizations:   . Attends Archivist Meetings:   Marland Kitchen Marital Status:     No Known Allergies   Current Outpatient Medications:  .  albuterol (VENTOLIN HFA) 108 (90 Base) MCG/ACT inhaler, Inhale 2 puffs into the lungs every 6 (six) hours as needed., Disp: 18 g, Rfl: 0 .  atorvastatin (LIPITOR) 20 MG tablet, TAKE 1/2 TABLET BY MOUTH DAILY AT 6 PM, Disp: 45 tablet, Rfl: 0 .  busPIRone (BUSPAR) 7.5 MG tablet, 1-2 TAB BY MOUTH 3 TIMES A DAY FOR ANXIETY, Disp: 540 tablet, Rfl: 1 .  cefTRIAXone (ROCEPHIN) 10 g injection, , Disp: , Rfl:  .  cetirizine (ZYRTEC) 10 MG tablet, Take 10 mg by mouth daily. In the morning, Disp: , Rfl:  .  diazepam (VALIUM) 10 MG tablet, TAKE 1 TABLET(10 MG) BY MOUTH AT BEDTIME AS NEEDED, Disp: 30 tablet, Rfl: 1 .  famotidine (PEPCID) 40 MG tablet, TAKE  1 TABLET BY MOUTH EVERYDAY AT BEDTIME, Disp: 90 tablet, Rfl: 1 .  FERRETTS 325 (106 Fe) MG  TABS tablet, TAKE 1 TABLET (106 MG OF IRON TOTAL) BY MOUTH DAILY. (Patient taking differently: 2 (two) times daily. ), Disp: 30 tablet, Rfl: 2 .  gabapentin (NEURONTIN) 300 MG capsule, Take 1 capsule (300 mg total) by mouth 3 (three) times daily., Disp: 270 capsule, Rfl: 3 .  glucose blood (CONTOUR NEXT TEST) test strip, Check blood sugar once daily, Disp: 100 each, Rfl: 12 .  HYDROcodone-acetaminophen (NORCO/VICODIN) 5-325 MG tablet, Take 1 tablet by mouth every 6 (six) hours as needed for moderate pain., Disp: 30 tablet, Rfl: 0 .  ibuprofen (ADVIL) 600 MG tablet, Take 1 tablet (600 mg total) by mouth every 8 (eight) hours as needed., Disp: 90 tablet, Rfl: 0 .  lisinopril (ZESTRIL) 5 MG tablet, TAKE 1 TABLET BY MOUTH TWICE A DAY, Disp: 180 tablet, Rfl: 1 .  meclizine (ANTIVERT) 12.5 MG tablet, Take 1 tablet (12.5 mg total) by mouth 3 (three) times daily as needed for dizziness., Disp: 30 tablet, Rfl: 0 .  metFORMIN (GLUCOPHAGE) 500 MG tablet, Take 1 tablet (500 mg total) by mouth 2 (two) times daily with a meal., Disp: 90 tablet, Rfl: 3 .  testosterone cypionate (DEPOTESTOSTERONE CYPIONATE) 200 MG/ML injection, Inject 1 mL (200 mg total) into the muscle every 14 (fourteen) days., Disp: 1 mL, Rfl: 5 .  tiZANidine (ZANAFLEX) 4 MG tablet, TAKE 1.5 TABLETS (6 MG TOTAL) BY MOUTH EVERY 6 (SIX) HOURS AS NEEDED FOR MUSCLE SPASMS., Disp: 90 tablet, Rfl: 1 .  venlafaxine XR (EFFEXOR-XR) 150 MG 24 hr capsule, TAKE ONE CAPSULE BY MOUTH IN THE MORNING with breakfast, Disp: 90 capsule, Rfl: 0 .  tamsulosin (FLOMAX) 0.4 MG CAPS capsule, TAKE TWO CAPSULES BY MOUTH DAILY  (Patient not taking: Reported on 11/05/2019), Disp: 180 capsule, Rfl: 0  Current Facility-Administered Medications:  .  cefTRIAXone (ROCEPHIN) 2 g in dextrose 5 % 50 mL IVPB, 2 g, Intravenous, Q24H, Powers, Evern Core, MD .  testosterone cypionate (DEPOTESTOSTERONE CYPIONATE) injection 200 mg, 200 mg, Intramuscular, Q14 Days, Saguier, Edward, PA-C, 200  mg at 07/16/19 0942 .  testosterone cypionate (DEPOTESTOSTERONE CYPIONATE) injection 200 mg, 200 mg, Intramuscular, Q14 Days, Saguier, Edward, PA-C, 200 mg at 10/29/19 1012   Review of Systems  Constitutional: Positive for fatigue. Negative for activity change, appetite change, chills, fever and unexpected weight change.  HENT: Negative for congestion, rhinorrhea, sinus pressure, sneezing, sore throat and trouble swallowing.   Eyes: Negative for photophobia and visual disturbance.  Respiratory: Negative for cough, chest tightness, shortness of breath, wheezing and stridor.   Cardiovascular: Negative for chest pain, palpitations and leg swelling.  Gastrointestinal: Negative for abdominal distention, abdominal pain, anal bleeding, blood in stool, constipation, diarrhea, nausea and vomiting.  Genitourinary: Negative for difficulty urinating, dysuria, flank pain and hematuria.  Musculoskeletal: Positive for arthralgias and back pain. Negative for gait problem, joint swelling and myalgias.  Skin: Negative for color change, pallor, rash and wound.  Neurological: Negative for dizziness, tremors, weakness and light-headedness.  Hematological: Negative for adenopathy. Does not bruise/bleed easily.  Psychiatric/Behavioral: Negative for agitation, behavioral problems, confusion, decreased concentration, dysphoric mood and sleep disturbance.       Objective:   Physical Exam Constitutional:      General: He is not in acute distress.    Appearance: Normal appearance. He is well-developed. He is not ill-appearing or diaphoretic.  HENT:     Head: Normocephalic and atraumatic.  Right Ear: Hearing and external ear normal.     Left Ear: Hearing and external ear normal.     Nose: No nasal deformity or rhinorrhea.  Eyes:     General: No scleral icterus.    Extraocular Movements: Extraocular movements intact.     Conjunctiva/sclera: Conjunctivae normal.     Right eye: Right conjunctiva is not  injected.     Left eye: Left conjunctiva is not injected.  Neck:     Vascular: No JVD.  Cardiovascular:     Rate and Rhythm: Normal rate and regular rhythm.     Heart sounds: S1 normal and S2 normal. No friction rub.  Pulmonary:     Effort: Pulmonary effort is normal. No respiratory distress.  Abdominal:     General: There is no distension.     Palpations: Abdomen is soft.  Musculoskeletal:        General: Normal range of motion.     Right shoulder: Normal.     Left shoulder: Normal.     Cervical back: Normal range of motion and neck supple.     Right hip: Normal.     Left hip: Normal.     Right knee: Normal.     Left knee: Normal.  Lymphadenopathy:     Head:     Right side of head: No submandibular, preauricular or posterior auricular adenopathy.     Left side of head: No submandibular, preauricular or posterior auricular adenopathy.     Cervical: No cervical adenopathy.     Right cervical: No superficial or deep cervical adenopathy.    Left cervical: No superficial or deep cervical adenopathy.  Skin:    General: Skin is warm and dry.     Coloration: Skin is not pale.     Findings: No abrasion, bruising, ecchymosis, erythema, lesion or rash.     Nails: There is no clubbing.  Neurological:     General: No focal deficit present.     Mental Status: He is alert and oriented to person, place, and time.     Sensory: No sensory deficit.     Coordination: Coordination normal.     Gait: Gait normal.  Psychiatric:        Attention and Perception: He is attentive.        Mood and Affect: Mood is depressed.        Speech: Speech normal.        Behavior: Behavior normal. Behavior is cooperative.        Thought Content: Thought content normal.        Judgment: Judgment normal.           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  I will plan on seeing her back in 1 month's time

## 2019-11-05 NOTE — Telephone Encounter (Signed)
JN 

## 2019-11-06 ENCOUNTER — Other Ambulatory Visit: Payer: Self-pay | Admitting: Specialist

## 2019-11-06 ENCOUNTER — Encounter: Payer: Self-pay | Admitting: Specialist

## 2019-11-06 DIAGNOSIS — M4635 Infection of intervertebral disc (pyogenic), thoracolumbar region: Secondary | ICD-10-CM | POA: Diagnosis not present

## 2019-11-06 DIAGNOSIS — M4325 Fusion of spine, thoracolumbar region: Secondary | ICD-10-CM | POA: Diagnosis not present

## 2019-11-06 MED ORDER — VENLAFAXINE HCL ER 150 MG PO CP24: 150.0000 mg | ORAL_CAPSULE | Freq: Every day | ORAL | 0 refills | Status: DC

## 2019-11-06 MED ORDER — HYDROCODONE-ACETAMINOPHEN 5-325 MG PO TABS: 1.0000 | ORAL_TABLET | Freq: Four times a day (QID) | ORAL | 0 refills | Status: DC | PRN

## 2019-11-07 DIAGNOSIS — M4325 Fusion of spine, thoracolumbar region: Secondary | ICD-10-CM | POA: Diagnosis not present

## 2019-11-07 DIAGNOSIS — M4635 Infection of intervertebral disc (pyogenic), thoracolumbar region: Secondary | ICD-10-CM | POA: Diagnosis not present

## 2019-11-08 DIAGNOSIS — M4635 Infection of intervertebral disc (pyogenic), thoracolumbar region: Secondary | ICD-10-CM | POA: Diagnosis not present

## 2019-11-08 DIAGNOSIS — M4325 Fusion of spine, thoracolumbar region: Secondary | ICD-10-CM | POA: Diagnosis not present

## 2019-11-09 DIAGNOSIS — M4325 Fusion of spine, thoracolumbar region: Secondary | ICD-10-CM | POA: Diagnosis not present

## 2019-11-09 DIAGNOSIS — M4635 Infection of intervertebral disc (pyogenic), thoracolumbar region: Secondary | ICD-10-CM | POA: Diagnosis not present

## 2019-11-10 DIAGNOSIS — M4635 Infection of intervertebral disc (pyogenic), thoracolumbar region: Secondary | ICD-10-CM | POA: Diagnosis not present

## 2019-11-10 DIAGNOSIS — M4325 Fusion of spine, thoracolumbar region: Secondary | ICD-10-CM | POA: Diagnosis not present

## 2019-11-11 DIAGNOSIS — M4325 Fusion of spine, thoracolumbar region: Secondary | ICD-10-CM | POA: Diagnosis not present

## 2019-11-11 DIAGNOSIS — M4635 Infection of intervertebral disc (pyogenic), thoracolumbar region: Secondary | ICD-10-CM | POA: Diagnosis not present

## 2019-11-12 ENCOUNTER — Encounter: Payer: Self-pay | Admitting: Infectious Disease

## 2019-11-12 ENCOUNTER — Other Ambulatory Visit: Payer: Self-pay | Admitting: Specialist

## 2019-11-12 ENCOUNTER — Telehealth: Payer: Self-pay

## 2019-11-12 DIAGNOSIS — M4635 Infection of intervertebral disc (pyogenic), thoracolumbar region: Secondary | ICD-10-CM | POA: Diagnosis not present

## 2019-11-12 DIAGNOSIS — E139 Other specified diabetes mellitus without complications: Secondary | ICD-10-CM | POA: Diagnosis not present

## 2019-11-12 DIAGNOSIS — M4325 Fusion of spine, thoracolumbar region: Secondary | ICD-10-CM | POA: Diagnosis not present

## 2019-11-12 NOTE — Telephone Encounter (Signed)
Patient called in to see if PA Mackie Pai could send in a prescription for  testosterone cypionate (DEPOTESTOSTERONE CYPIONATE) 200 MG/ML injection [id number]    Please send it to [id number] - [place], [place] - [address], [place] [address]  Phone:  [phone] Fax:  [phone]  DEA #:  [id number]

## 2019-11-12 NOTE — Telephone Encounter (Signed)
Nitka patient 

## 2019-11-13 ENCOUNTER — Ambulatory Visit: Payer: Medicare Other

## 2019-11-13 DIAGNOSIS — M4325 Fusion of spine, thoracolumbar region: Secondary | ICD-10-CM | POA: Diagnosis not present

## 2019-11-13 DIAGNOSIS — M4635 Infection of intervertebral disc (pyogenic), thoracolumbar region: Secondary | ICD-10-CM | POA: Diagnosis not present

## 2019-11-13 MED ORDER — HYDROCODONE-ACETAMINOPHEN 5-325 MG PO TABS: 1.0000 | ORAL_TABLET | Freq: Four times a day (QID) | ORAL | 0 refills | Status: DC | PRN

## 2019-11-14 ENCOUNTER — Encounter: Payer: Self-pay | Admitting: Specialist

## 2019-11-14 ENCOUNTER — Other Ambulatory Visit: Payer: Self-pay

## 2019-11-14 ENCOUNTER — Encounter: Payer: Self-pay | Admitting: Medical

## 2019-11-14 ENCOUNTER — Ambulatory Visit: Payer: Medicare Other | Admitting: Specialist

## 2019-11-14 ENCOUNTER — Ambulatory Visit: Payer: Self-pay

## 2019-11-14 VITALS — BP 132/72 | HR 71 | Ht 70.0 in | Wt 195.0 lb

## 2019-11-14 DIAGNOSIS — M546 Pain in thoracic spine: Secondary | ICD-10-CM

## 2019-11-14 DIAGNOSIS — T84498A Other mechanical complication of other internal orthopedic devices, implants and grafts, initial encounter: Secondary | ICD-10-CM | POA: Diagnosis not present

## 2019-11-14 DIAGNOSIS — M4624 Osteomyelitis of vertebra, thoracic region: Secondary | ICD-10-CM

## 2019-11-14 DIAGNOSIS — M4325 Fusion of spine, thoracolumbar region: Secondary | ICD-10-CM | POA: Diagnosis not present

## 2019-11-14 DIAGNOSIS — M4635 Infection of intervertebral disc (pyogenic), thoracolumbar region: Secondary | ICD-10-CM | POA: Diagnosis not present

## 2019-11-14 MED ORDER — PREGABALIN 75 MG PO CAPS: 75.0000 mg | ORAL_CAPSULE | Freq: Two times a day (BID) | ORAL | 3 refills | Status: DC

## 2019-11-14 MED ORDER — HYDROCODONE-ACETAMINOPHEN 10-325 MG PO TABS: 1.0000 | ORAL_TABLET | Freq: Four times a day (QID) | ORAL | 0 refills | Status: DC | PRN

## 2019-11-14 NOTE — Progress Notes (Signed)
Office Visit Note   Patient: Luis Brown           Date of Birth: 08-05-1945           MRN: [id number] Visit Date: 11/14/2019              Requested by: Mackie Pai, PA-C [address],  [place] [address] PCP: Mackie Pai, PA-C   Assessment & Plan: Visit Diagnoses:  1. 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[place])   2. 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 Pain in thoracic spine     Plan: Avoid frequent bending and stooping  No lifting greater than 10 lbs. May use ice or moist heat for pain. Weight loss is of benefit.  Exercise is important to improve your indurance and does allow people to function better inspite of back pain. Stop the evening dose of gabapentin and start lyrica at night in its place then discontinue the morning and afternoon doses of gabapentin  After 5 days and replace with morning dose of lyrica. Change hydrocodone to 10 mg po every 6 hours.   Follow-Up Instructions: Return in about 2 weeks (around 11/28/2019).   Orders:  Orders Placed This Encounter  Procedures  . XR Thoracic Spine 2 View  . MR Thoracic Spine w/o contrast   Meds ordered this encounter  Medications  . HYDROcodone-acetaminophen (NORCO) 10-325 MG tablet    Sig: Take 1 tablet by mouth every 6 (six) hours as needed.    Dispense:  30 tablet    Refill:  0  . pregabalin (LYRICA) 75 MG capsule    Sig: Take 1 capsule (75 mg total) by mouth 2 (two) times daily.    Dispense:  60 capsule    Refill:  3      Procedures: No procedures performed   Clinical Data: No additional findings.   Subjective: Chief Complaint  Patient presents with  . 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 He was  placed on IV antibiotics and the T10 . He is painful and the hydrocodone is not relieving the pain. He has had some GI problems likely due to the IV antibiotics and is concerned that nothing is being done. Recent lab were done by Dr. Tommy Medal.   Review of Systems  Constitutional: Positive for activity change, appetite change, chills, fever and unexpected weight change (10 lb weight loss). Negative for diaphoresis and fatigue.  HENT: Negative.   Eyes: Negative.   Respiratory: Negative.   Cardiovascular: Positive for chest pain.  Gastrointestinal: Positive for abdominal distention and diarrhea.  Endocrine: Negative.   Genitourinary: Negative.   Musculoskeletal: Positive for back pain and gait problem.  Skin: Negative.   Allergic/Immunologic: Negative.  Negative for environmental allergies, food allergies and immunocompromised state.  Neurological: Positive for weakness and numbness. Negative for dizziness, tremors, seizures, syncope, facial asymmetry, speech difficulty, light-headedness and headaches.  Hematological: Negative.      Objective: Vital Signs: BP 132/72 (BP Location: Left Arm, Patient Position: Sitting)   Pulse 71   Ht 5\' 10"  (1.778 m)   Wt 195 lb (88.5 kg)   BMI 27.98 kg/m   Physical Exam  Ortho Exam  Specialty Comments:  No specialty comments available.  Imaging: XR Thoracic Spine 2 View  Result Date: 11/14/2019 AP and lateral thoracic spine demonstrates obliteration of the T4-5 disc space, there is more separation of the T 5 pedicle screws within the vertebral body of T5 suggesting increased loosening of the screws at this level.     PMFS History: Patient Active Problem List   Diagnosis Date Noted  . 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 Medication monitoring encounter 08/26/2019  . Peripheral neuropathy 08/07/2019  . Alcohol abuse 05/09/2019  . Hyponatremia 05/05/2019  . 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 Fusion of spine of  thoracolumbar region 03/12/2019  . Status post thoracic spinal fusion 02/12/2019  . 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 Thoracic spinal stenosis 04/17/2017  . Painful orthopaedic hardware ([place]) 04/17/2017  . 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 Low back pain radiating to both legs 01/16/2017  . Esophageal reflux 02/10/2015  . Medicare annual wellness visit, subsequent 02/10/2015  . Personal history of colonic polyps 10/27/2012  . 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  . BPH (benign prostatic hyperplasia) 04/23/2012  . ED (erectile dysfunction) 04/23/2012  . Allergic state 10/25/2011  . Anxiety and depression 10/25/2011  . Asthma   . Diabetes mellitus without complication ([place])   . Hyperlipidemia   . Overweight   . Insomnia   . Fatigue   . Preventative health care   . DDD (degenerative disc disease), lumbosacral   . Thoracic degenerative disc disease   . HTN (hypertension)    Past Medical History:  Diagnosis Date  . Acute pharyngitis 10/21/2013  . Allergy    grass and pollen  . Anxiety and depression 10/25/2011  . BPH (benign prostatic hyperplasia) 04/23/2012  . Chicken pox as a child  . DDD (degenerative disc disease)    cervical responds to steroid injections and low back required surgery  . DDD (degenerative disc disease), lumbosacral   . Diabetes mellitus    pre  . Dyspnea   . ED (erectile dysfunction) 04/23/2012  . Elevated BP   . 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 Esophageal reflux 02/10/2015  . Fatigue   . HTN (hypertension)   . Hyperglycemia    preDM   . Hyperlipidemia   . Insomnia   . Low back pain radiating to both legs 01/16/2017  . Measles as a child  . Medicare annual wellness visit, subsequent 02/10/2015  . Overweight(278.02)   . Peripheral neuropathy 08/07/2019  . Personal history of colonic polyps 10/27/2012   Follows with [facility]  .  Pneumonia    " walking"  . Preventative health care   . 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  . Wears glasses     Family History  Problem Relation Age of Onset  . Hypertension Mother   . Diabetes Mother        type 2  . Cancer Mother 64       breast in remission  . Emphysema Father        smoker  . COPD Father        smoker  . Stroke Father 84       mini  . Heart disease Father   . Hypertension Sister   . Hyperlipidemia Sister   . Scoliosis Sister   . Osteoporosis Sister   . Hypertension Maternal Grandmother   . Scoliosis Maternal Grandmother   . Heart disease Maternal Grandfather   . Heart  disease Paternal Grandfather        smoker  . Heart disease Daughter     Past Surgical History:  Procedure Laterality Date  . BACK SURGERY  2012 and 1994   Dr Margret Chance, screws and cage in low back  . EYE SURGERY Bilateral    2016  . HARDWARE REVISION  03/12/2019   REVISION OF HARDWARE THORACIC TEN-THORACIC ELEVEN WITH APPLICATION OF ADDITIONAL RODS AND ROD SLEEVES THORACIC EIGHT-THORACIC TWELVE 12-LUMBAR ONE (N/A )  . LAMINECTOMY  02/12/2019   DECOMPRESSIVE LAMINECTOMY THORACIC NINE-THORACIC TEN AND THORACIC TEN-THORACIC ELEVEN, EXTENSION OF THORACOLUMBAR FUSION FROM THORACIC TEN TO THORACIC FIVE, LOCAL BONE GRAFT, ALLOGRAFT AND VIVIGEN (N/A)  . SPINAL FUSION N/A 02/12/2019   Procedure: DECOMPRESSIVE LAMINECTOMY THORACIC NINE-THORACIC TEN AND THORACIC TEN-THORACIC ELEVEN, EXTENSION OF THORACOLUMBAR FUSION FROM THORACIC TEN TO THORACIC FIVE, LOCAL BONE GRAFT, ALLOGRAFT AND VIVIGEN;  Surgeon: Jessy Oto, MD;  Location: [facility];  Service: Orthopedics;  Laterality: N/A;  DECOMPRESSIVE LAMINECTOMY THORACIC NINE-THORACIC TEN AND THORACIC TEN-THORACIC ELEVEN, EXTENSION OF TH  . SPINAL FUSION N/A 03/12/2019   Procedure: REVISION OF HARDWARE THORACIC TEN-THORACIC ELEVEN WITH APPLICATION OF ADDITIONAL RODS AND ROD SLEEVES THORACIC EIGHT-THORACIC TWELVE 12-LUMBAR ONE;  Surgeon: Jessy Oto, MD;   Location: [facility];  Service: Orthopedics;  Laterality: N/A;  . TONSILLECTOMY    . TONSILLECTOMY     as child  . torn rotator cuff  2010   right   Social History   Occupational History  . Not on file  Tobacco Use  . Smoking status: Former Smoker    Packs/day: 1.00    Years: 20.00    Pack years: 20.00    Types: Cigarettes    Quit date: 07/18/1989    Years since quitting: 30.3  . Smokeless tobacco: Never Used  . Tobacco comment: off and on smoking for the 20 yrs  Substance and Sexual Activity  . Alcohol use: Yes    Comment: drink or two a day- mixed drink  . Drug use: No  . Sexual activity: Yes    Comment: lives with wife, still working, no dietary restrictions, continues to exercise intermittently

## 2019-11-14 NOTE — Patient Instructions (Addendum)
Avoid frequent bending and stooping  No lifting greater than 10 lbs. May use ice or moist heat for pain. Weight loss is of benefit.  Exercise is important to improve your indurance and does allow people to function better inspite of back pain. Stop the evening dose of gabapentin and start lyrica at night in its place then discontinue the morning and afternoon doses of gabapentin  After 5 days and replace with morning dose of lyrica. Change hydrocodone to 10 mg po every 6 hours.

## 2019-11-15 DIAGNOSIS — M4635 Infection of intervertebral disc (pyogenic), thoracolumbar region: Secondary | ICD-10-CM | POA: Diagnosis not present

## 2019-11-15 DIAGNOSIS — M4325 Fusion of spine, thoracolumbar region: Secondary | ICD-10-CM | POA: Diagnosis not present

## 2019-11-16 DIAGNOSIS — M4325 Fusion of spine, thoracolumbar region: Secondary | ICD-10-CM | POA: Diagnosis not present

## 2019-11-16 DIAGNOSIS — M4635 Infection of intervertebral disc (pyogenic), thoracolumbar region: Secondary | ICD-10-CM | POA: Diagnosis not present

## 2019-11-17 ENCOUNTER — Encounter: Payer: Self-pay | Admitting: Specialist

## 2019-11-17 DIAGNOSIS — M4325 Fusion of spine, thoracolumbar region: Secondary | ICD-10-CM | POA: Diagnosis not present

## 2019-11-17 DIAGNOSIS — M4635 Infection of intervertebral disc (pyogenic), thoracolumbar region: Secondary | ICD-10-CM | POA: Diagnosis not present

## 2019-11-17 MED ORDER — TESTOSTERONE CYPIONATE 200 MG/ML IM SOLN: 200.0000 mg | INTRAMUSCULAR | 5 refills | Status: DC

## 2019-11-17 NOTE — Telephone Encounter (Signed)
Will you call pt and get him set up for testosterone injection. See the most recent prescription I sent to his pharmacy.   Will you talk to me first. Pt in past expressed desire to do injections at home. But the most he can get of testosterone is one month supply due to it being controlled medications. So he will still have to go to pharmacy and pick up prescription.   I usually don't prescribe for pt to do at home since controlled med. You have given him the injection before correct?

## 2019-11-18 ENCOUNTER — Telehealth: Payer: Self-pay | Admitting: Medical

## 2019-11-18 DIAGNOSIS — M4635 Infection of intervertebral disc (pyogenic), thoracolumbar region: Secondary | ICD-10-CM | POA: Diagnosis not present

## 2019-11-18 DIAGNOSIS — M4325 Fusion of spine, thoracolumbar region: Secondary | ICD-10-CM | POA: Diagnosis not present

## 2019-11-18 MED ORDER — TAMSULOSIN HCL 0.4 MG PO CAPS: 0.8000 mg | ORAL_CAPSULE | Freq: Every day | ORAL | 0 refills | Status: DC

## 2019-11-18 NOTE — Telephone Encounter (Signed)
Medication sent.

## 2019-11-18 NOTE — Telephone Encounter (Signed)
Medication: tamsulosin (FLOMAX) 0.4 MG CAPS capsule    Has the patient contacted their pharmacy? No. (If no, request that the patient contact the pharmacy for the refill.) (If yes, when and what did the pharmacy advise?)  Preferred Pharmacy (with phone number or street name): [id number] [place], [place] - [place]  [address], [place] [address]  Phone:  [phone] Fax:  [phone]  DEA #:  [id number]  Agent: Please be advised that RX refills may take up to 3 business days. We ask that you follow-up with your pharmacy.

## 2019-11-19 ENCOUNTER — Encounter: Payer: Self-pay | Admitting: Specialist

## 2019-11-19 DIAGNOSIS — E139 Other specified diabetes mellitus without complications: Secondary | ICD-10-CM | POA: Diagnosis not present

## 2019-11-19 DIAGNOSIS — M4325 Fusion of spine, thoracolumbar region: Secondary | ICD-10-CM | POA: Diagnosis not present

## 2019-11-19 DIAGNOSIS — M4635 Infection of intervertebral disc (pyogenic), thoracolumbar region: Secondary | ICD-10-CM | POA: Diagnosis not present

## 2019-11-20 ENCOUNTER — Other Ambulatory Visit: Payer: Self-pay | Admitting: Specialist

## 2019-11-20 DIAGNOSIS — M4635 Infection of intervertebral disc (pyogenic), thoracolumbar region: Secondary | ICD-10-CM | POA: Diagnosis not present

## 2019-11-20 DIAGNOSIS — M4325 Fusion of spine, thoracolumbar region: Secondary | ICD-10-CM | POA: Diagnosis not present

## 2019-11-20 NOTE — Telephone Encounter (Signed)
Nitka patient 

## 2019-11-21 ENCOUNTER — Ambulatory Visit (INDEPENDENT_AMBULATORY_CARE_PROVIDER_SITE_OTHER): Payer: Medicare Other

## 2019-11-21 ENCOUNTER — Other Ambulatory Visit: Payer: Self-pay | Admitting: Family Medicine

## 2019-11-21 ENCOUNTER — Other Ambulatory Visit: Payer: Self-pay

## 2019-11-21 DIAGNOSIS — M4635 Infection of intervertebral disc (pyogenic), thoracolumbar region: Secondary | ICD-10-CM | POA: Diagnosis not present

## 2019-11-21 DIAGNOSIS — E349 Endocrine disorder, unspecified: Secondary | ICD-10-CM

## 2019-11-21 DIAGNOSIS — M4325 Fusion of spine, thoracolumbar region: Secondary | ICD-10-CM | POA: Diagnosis not present

## 2019-11-21 DIAGNOSIS — R7989 Other specified abnormal findings of blood chemistry: Secondary | ICD-10-CM

## 2019-11-21 MED ORDER — HYDROCODONE-ACETAMINOPHEN 10-325 MG PO TABS: 1.0000 | ORAL_TABLET | Freq: Four times a day (QID) | ORAL | 0 refills | Status: DC | PRN

## 2019-11-21 NOTE — Progress Notes (Addendum)
Patient here today for testosterone injection per Mackie Pai. Patient received injection in right upper outer quadrant IM. Patient tolerated well. Patient supplied his own medication.   Agree with administration of testosterone for low testosterone.  Agree with administration of testosterone for dx low T. Mackie Pai, PA-C

## 2019-11-22 DIAGNOSIS — M4325 Fusion of spine, thoracolumbar region: Secondary | ICD-10-CM | POA: Diagnosis not present

## 2019-11-22 DIAGNOSIS — M4635 Infection of intervertebral disc (pyogenic), thoracolumbar region: Secondary | ICD-10-CM | POA: Diagnosis not present

## 2019-11-23 DIAGNOSIS — M4325 Fusion of spine, thoracolumbar region: Secondary | ICD-10-CM | POA: Diagnosis not present

## 2019-11-23 DIAGNOSIS — M4635 Infection of intervertebral disc (pyogenic), thoracolumbar region: Secondary | ICD-10-CM | POA: Diagnosis not present

## 2019-11-24 DIAGNOSIS — M4325 Fusion of spine, thoracolumbar region: Secondary | ICD-10-CM | POA: Diagnosis not present

## 2019-11-24 DIAGNOSIS — M4635 Infection of intervertebral disc (pyogenic), thoracolumbar region: Secondary | ICD-10-CM | POA: Diagnosis not present

## 2019-11-25 ENCOUNTER — Other Ambulatory Visit: Payer: Self-pay

## 2019-11-25 ENCOUNTER — Ambulatory Visit (INDEPENDENT_AMBULATORY_CARE_PROVIDER_SITE_OTHER): Payer: Medicare Other | Admitting: Medical

## 2019-11-25 ENCOUNTER — Ambulatory Visit: Payer: Medicare Other | Admitting: Internal Medicine

## 2019-11-25 ENCOUNTER — Encounter: Payer: Self-pay | Admitting: Medical

## 2019-11-25 ENCOUNTER — Telehealth: Payer: Self-pay | Admitting: Medical

## 2019-11-25 VITALS — BP 107/57 | HR 71 | Temp 97.9°F | Resp 18 | Ht 70.0 in | Wt 181.0 lb

## 2019-11-25 DIAGNOSIS — M4624 Osteomyelitis of vertebra, thoracic region: Secondary | ICD-10-CM | POA: Diagnosis not present

## 2019-11-25 DIAGNOSIS — D649 Anemia, unspecified: Secondary | ICD-10-CM

## 2019-11-25 DIAGNOSIS — E119 Type 2 diabetes mellitus without complications: Secondary | ICD-10-CM | POA: Diagnosis not present

## 2019-11-25 DIAGNOSIS — M4325 Fusion of spine, thoracolumbar region: Secondary | ICD-10-CM | POA: Diagnosis not present

## 2019-11-25 DIAGNOSIS — M4635 Infection of intervertebral disc (pyogenic), thoracolumbar region: Secondary | ICD-10-CM | POA: Diagnosis not present

## 2019-11-25 LAB — CBC WITH DIFFERENTIAL/PLATELET
Basophils Absolute: 0 10*3/uL (ref 0.0–0.1)
Basophils Relative: 0.5 % (ref 0.0–3.0)
Eosinophils Absolute: 0.2 10*3/uL (ref 0.0–0.7)
Eosinophils Relative: 2.9 % (ref 0.0–5.0)
HCT: 34 % — ABNORMAL LOW (ref 39.0–52.0)
Hemoglobin: 11.4 g/dL — ABNORMAL LOW (ref 13.0–17.0)
Lymphocytes Relative: 6.5 % — ABNORMAL LOW (ref 12.0–46.0)
Lymphs Abs: 0.4 10*3/uL — ABNORMAL LOW (ref 0.7–4.0)
MCHC: 33.6 g/dL (ref 30.0–36.0)
MCV: 87.3 fl (ref 78.0–100.0)
Monocytes Absolute: 0.7 10*3/uL (ref 0.1–1.0)
Monocytes Relative: 12.4 % — ABNORMAL HIGH (ref 3.0–12.0)
Neutro Abs: 4.5 10*3/uL (ref 1.4–7.7)
Neutrophils Relative %: 77.7 % — ABNORMAL HIGH (ref 43.0–77.0)
Platelets: 325 10*3/uL (ref 150.0–400.0)
RBC: 3.9 Mil/uL — ABNORMAL LOW (ref 4.22–5.81)
RDW: 16.7 % — ABNORMAL HIGH (ref 11.5–15.5)
WBC: 5.8 10*3/uL (ref 4.0–10.5)

## 2019-11-25 LAB — COMPREHENSIVE METABOLIC PANEL
ALT: 9 U/L (ref 0–53)
AST: 13 U/L (ref 0–37)
Albumin: 3.8 g/dL (ref 3.5–5.2)
Alkaline Phosphatase: 101 U/L (ref 39–117)
BUN: 11 mg/dL (ref 6–23)
CO2: 30 mEq/L (ref 19–32)
Calcium: 8.9 mg/dL (ref 8.4–10.5)
Chloride: 97 mEq/L (ref 96–112)
Creatinine, Ser: 0.75 mg/dL (ref 0.40–1.50)
GFR: 101.19 mL/min (ref >60.00)
Glucose, Bld: 353 mg/dL — ABNORMAL HIGH (ref 70–99)
Potassium: 4.5 mEq/L (ref 3.5–5.1)
Sodium: 134 mEq/L — ABNORMAL LOW (ref 135–145)
Total Bilirubin: 0.4 mg/dL (ref 0.2–1.2)
Total Protein: 6 g/dL (ref 6.0–8.3)

## 2019-11-25 LAB — IRON: Iron: 64 ug/dL (ref 42–165)

## 2019-11-25 LAB — POCT CBG (FASTING - GLUCOSE)-MANUAL ENTRY: Glucose Fasting, POC: 398 mg/dL — AB (ref 70–99)

## 2019-11-25 MED ORDER — SITAGLIPTIN PHOSPHATE 50 MG PO TABS: 50.0000 mg | ORAL_TABLET | Freq: Every day | ORAL | 3 refills | Status: DC

## 2019-11-25 MED ORDER — METFORMIN HCL 1000 MG PO TABS: 1000.0000 mg | ORAL_TABLET | Freq: Two times a day (BID) | ORAL | 0 refills | Status: DC

## 2019-11-25 MED ORDER — FERRETTS 325 (106 FE) MG PO TABS: 1.0000 | ORAL_TABLET | Freq: Two times a day (BID) | ORAL | 2 refills | Status: DC

## 2019-11-25 NOTE — Telephone Encounter (Signed)
Rx metformin sent to pt pharmacy. 

## 2019-11-25 NOTE — Progress Notes (Addendum)
   Subjective:    [name], male    DOB: 09-Jan-1946, 74 y.o.   MRN: [id number]  HPI  Pt in for follow up.  Pt continues to have problems with spine infections. Pt is seeing both infectious disease and orthopedist. His spine infection is complicated and challenging situation. Now they are considering getting rid of hardware. Pt is getting iv antibiotics.  Pt blood sugar is is running around high 100's. Easily on 170. Now often at times over 200.  Pt has been on metformin 1 tablet a day twice a day.   Pt is still fatigued.   He get mri of spine on Dec 06, 2019. Then sees ID on Dec 10, 2019 and ortho MD on May 27.  Pt state appetite decreased. Get diarrhea.     Review of Systems     Objective:   Physical Exam  General- No acute distress. Pleasant patient. Neck- Full range of motion, no jvd Lungs- Clear, even and unlabored. Heart- regular rate and rhythm. Neurologic- CNII- XII grossly intact. Abdomen-soft, nt, nd, +bs. No rebound or guarding.        Assessment & Plan:  Your sugar levels have been increasing and will see how your kidney function looks. Based on high sugar level of 398 will go ahead and prescribe januvia and may need to max out on metformin as well. If sugars are not improving then may need insulin.  For fatigue will follow labs.  For bone infection following specialist advise. Hopefully you will find solution.  Follow up date to be determined after lab review.  Time spent with patient today was   minutes which consisted of chart revdiew, discussing diagnosis, work up treatment and documentation.  Mackie Pai, PA-C   Time spent with patient today was  25 minutes which consisted of chart review, discussing diagnosis, work up, treatment, answering question  and documentation.

## 2019-11-25 NOTE — Addendum Note (Signed)
Addended by: Anabel Halon on: 11/25/2019 10:22 AM   Modules accepted: Orders

## 2019-11-25 NOTE — Patient Instructions (Addendum)
Your sugar levels have been increasing and will see how your kidney function looks. Based on high sugar level of 398 will go ahead and prescribe januvia and may need to max out on metformin as well. If sugars are not improving then may need insulin.  For fatigue will follow labs.  For bone infection following specialist advise. Hopefully you will find solution. Removal of equipement maybe the solution.  Follow up date to be determined after lab review.

## 2019-11-26 DIAGNOSIS — M4325 Fusion of spine, thoracolumbar region: Secondary | ICD-10-CM | POA: Diagnosis not present

## 2019-11-26 DIAGNOSIS — E139 Other specified diabetes mellitus without complications: Secondary | ICD-10-CM | POA: Diagnosis not present

## 2019-11-26 DIAGNOSIS — M4635 Infection of intervertebral disc (pyogenic), thoracolumbar region: Secondary | ICD-10-CM | POA: Diagnosis not present

## 2019-11-27 ENCOUNTER — Other Ambulatory Visit: Payer: Self-pay | Admitting: Specialist

## 2019-11-27 DIAGNOSIS — M4635 Infection of intervertebral disc (pyogenic), thoracolumbar region: Secondary | ICD-10-CM | POA: Diagnosis not present

## 2019-11-27 DIAGNOSIS — M4325 Fusion of spine, thoracolumbar region: Secondary | ICD-10-CM | POA: Diagnosis not present

## 2019-11-27 NOTE — Telephone Encounter (Signed)
This is a JN pt.  

## 2019-11-28 DIAGNOSIS — M4325 Fusion of spine, thoracolumbar region: Secondary | ICD-10-CM | POA: Diagnosis not present

## 2019-11-28 DIAGNOSIS — M4635 Infection of intervertebral disc (pyogenic), thoracolumbar region: Secondary | ICD-10-CM | POA: Diagnosis not present

## 2019-11-29 DIAGNOSIS — M4635 Infection of intervertebral disc (pyogenic), thoracolumbar region: Secondary | ICD-10-CM | POA: Diagnosis not present

## 2019-11-29 DIAGNOSIS — M4325 Fusion of spine, thoracolumbar region: Secondary | ICD-10-CM | POA: Diagnosis not present

## 2019-11-29 MED ORDER — HYDROCODONE-ACETAMINOPHEN 10-325 MG PO TABS: 1.0000 | ORAL_TABLET | Freq: Four times a day (QID) | ORAL | 0 refills | Status: DC | PRN

## 2019-11-30 DIAGNOSIS — M4325 Fusion of spine, thoracolumbar region: Secondary | ICD-10-CM | POA: Diagnosis not present

## 2019-11-30 DIAGNOSIS — M4635 Infection of intervertebral disc (pyogenic), thoracolumbar region: Secondary | ICD-10-CM | POA: Diagnosis not present

## 2019-12-01 DIAGNOSIS — M4635 Infection of intervertebral disc (pyogenic), thoracolumbar region: Secondary | ICD-10-CM | POA: Diagnosis not present

## 2019-12-01 DIAGNOSIS — M4325 Fusion of spine, thoracolumbar region: Secondary | ICD-10-CM | POA: Diagnosis not present

## 2019-12-02 DIAGNOSIS — M4325 Fusion of spine, thoracolumbar region: Secondary | ICD-10-CM | POA: Diagnosis not present

## 2019-12-02 DIAGNOSIS — M4635 Infection of intervertebral disc (pyogenic), thoracolumbar region: Secondary | ICD-10-CM | POA: Diagnosis not present

## 2019-12-03 ENCOUNTER — Telehealth: Payer: Self-pay

## 2019-12-03 DIAGNOSIS — E139 Other specified diabetes mellitus without complications: Secondary | ICD-10-CM | POA: Diagnosis not present

## 2019-12-03 DIAGNOSIS — M4635 Infection of intervertebral disc (pyogenic), thoracolumbar region: Secondary | ICD-10-CM | POA: Diagnosis not present

## 2019-12-03 DIAGNOSIS — M4325 Fusion of spine, thoracolumbar region: Secondary | ICD-10-CM | POA: Diagnosis not present

## 2019-12-03 NOTE — Telephone Encounter (Signed)
Received call from [facility] for pull PICC after last dose of ceftriaxone. Will forward to provider.  Cerissa Zeiger [name], RN

## 2019-12-04 ENCOUNTER — Other Ambulatory Visit: Payer: Self-pay | Admitting: Specialist

## 2019-12-04 DIAGNOSIS — M4325 Fusion of spine, thoracolumbar region: Secondary | ICD-10-CM | POA: Diagnosis not present

## 2019-12-04 DIAGNOSIS — M4635 Infection of intervertebral disc (pyogenic), thoracolumbar region: Secondary | ICD-10-CM | POA: Diagnosis not present

## 2019-12-04 MED ORDER — HYDROCODONE-ACETAMINOPHEN 10-325 MG PO TABS: 1.0000 | ORAL_TABLET | Freq: Four times a day (QID) | ORAL | 0 refills | Status: DC | PRN

## 2019-12-04 NOTE — Telephone Encounter (Signed)
That is fine to pull the PICC line he should start doxycycline 100 mg twice daily as soon as the ceftriaxone is finished

## 2019-12-04 NOTE — Telephone Encounter (Signed)
Nitka patient 

## 2019-12-04 NOTE — Telephone Encounter (Signed)
[facility] notified that it's ok to pull PICC after last dose (5/19). Patient needs doxycycline prescription; doesn't appear to have in chart.   Thanks!  Andrey Mccaskill [name], RN

## 2019-12-05 ENCOUNTER — Other Ambulatory Visit: Payer: Self-pay

## 2019-12-05 ENCOUNTER — Encounter: Payer: Self-pay | Admitting: Medical

## 2019-12-05 ENCOUNTER — Ambulatory Visit (INDEPENDENT_AMBULATORY_CARE_PROVIDER_SITE_OTHER): Payer: Medicare Other

## 2019-12-05 DIAGNOSIS — M4635 Infection of intervertebral disc (pyogenic), thoracolumbar region: Secondary | ICD-10-CM | POA: Diagnosis not present

## 2019-12-05 DIAGNOSIS — R7989 Other specified abnormal findings of blood chemistry: Secondary | ICD-10-CM | POA: Diagnosis not present

## 2019-12-05 DIAGNOSIS — M4325 Fusion of spine, thoracolumbar region: Secondary | ICD-10-CM | POA: Diagnosis not present

## 2019-12-05 NOTE — Progress Notes (Signed)
  Patient here today for testosterone injection per Mackie Pai.Patient supplied his own medication testosterone cypionate 200mg /ml.  Patient received injection in right upper outer quadrant IM 200 mg. Patient tolerated well.  Agree with administration of testosterone for low testosterone.  Agree with administration of testosterone for dx low T. Mackie Pai, PA-C

## 2019-12-05 NOTE — Telephone Encounter (Signed)
Patient called back to confirm PICC pull today. He has about 2 weeks worth of doxycycline 100 mg at home from previous treatment, will start taking that today.  He will need refills if he is supposed to take more than 2 weeks of doxy. [facility]. Landis Gandy, RN

## 2019-12-06 ENCOUNTER — Ambulatory Visit
Admission: RE | Admit: 2019-12-06 | Discharge: 2019-12-06 | Disposition: A | Discharge status: Home / Self Care | Payer: Medicare Other | Source: Ambulatory Visit | Attending: Specialist | Admitting: Specialist

## 2019-12-06 DIAGNOSIS — T84498A Other mechanical complication of other internal orthopedic devices, implants and grafts, initial encounter: Secondary | ICD-10-CM

## 2019-12-06 DIAGNOSIS — M546 Pain in thoracic spine: Secondary | ICD-10-CM

## 2019-12-06 DIAGNOSIS — M4624 Osteomyelitis of vertebra, thoracic region: Secondary | ICD-10-CM

## 2019-12-06 NOTE — Telephone Encounter (Signed)
Patient had labs done 5/10 and advised to increase Metformin to 2 tabs. Please advise, thanks.

## 2019-12-09 ENCOUNTER — Encounter: Payer: Self-pay | Admitting: Specialist

## 2019-12-09 ENCOUNTER — Other Ambulatory Visit: Payer: Self-pay | Admitting: Infectious Disease

## 2019-12-09 ENCOUNTER — Telehealth: Payer: Self-pay

## 2019-12-09 MED ORDER — DOXYCYCLINE HYCLATE 100 MG PO TABS: 100.0000 mg | ORAL_TABLET | Freq: Two times a day (BID) | ORAL | 11 refills | Status: DC

## 2019-12-09 NOTE — Telephone Encounter (Signed)
Doxy ordered thanks Sharyn Lull!

## 2019-12-09 NOTE — Telephone Encounter (Signed)
[name] with [facility] wanted to let Dr. Louanne Skye know that MRI results are in patient's chart for T-Spine.  Thank you.

## 2019-12-10 ENCOUNTER — Other Ambulatory Visit: Payer: Self-pay

## 2019-12-10 ENCOUNTER — Ambulatory Visit: Payer: Medicare Other | Admitting: Infectious Disease

## 2019-12-10 ENCOUNTER — Encounter: Payer: Self-pay | Admitting: Infectious Disease

## 2019-12-10 VITALS — BP 86/52 | HR 65 | Temp 97.5°F | Wt 176.0 lb

## 2019-12-10 DIAGNOSIS — R5383 Other fatigue: Secondary | ICD-10-CM | POA: Diagnosis not present

## 2019-12-10 DIAGNOSIS — M4624 Osteomyelitis of vertebra, thoracic region: Secondary | ICD-10-CM | POA: Diagnosis not present

## 2019-12-10 DIAGNOSIS — R7989 Other specified abnormal findings of blood chemistry: Secondary | ICD-10-CM | POA: Diagnosis not present

## 2019-12-10 DIAGNOSIS — R42 Dizziness and giddiness: Secondary | ICD-10-CM | POA: Diagnosis not present

## 2019-12-10 DIAGNOSIS — G062 Extradural and subdural abscess, unspecified: Secondary | ICD-10-CM | POA: Diagnosis not present

## 2019-12-10 MED ORDER — AMOXICILLIN 500 MG PO CAPS
500.0000 mg | ORAL_CAPSULE | Freq: Three times a day (TID) | ORAL | 5 refills | Status: DC
Start: 2019-12-10 — End: 2019-12-26

## 2019-12-10 NOTE — Progress Notes (Signed)
Subjective:  Chief complaint: Low back pain, fatigue and at times dizziness  t [name], male    DOB: 17-Mar-1946, 74 y.o.   MRN: [id number]  HPI  25 -year-old man with a history of degenerative disc disease and multiple spinal fusions who was initially seen by Dr. Prince Rome in our group.  He has a rather complex surgical history which in regards to his back began in 2015 Dr. Agapito Games performed an T8-L1 fusion to assist with left leg radicular pain and numbness.  Despite multiple visits to see his surgeon at [facility] postoperatively, the patient states that his left leg numbness and direct low thoracic back pain returned within months after his surgery.  Imaging in 2018 showed that he had an L5-S1 rod breakage and loosening of the pedicle screws at that level.  He sought a second opinion with Dr. Louanne Skye late in 2018.  In November 2018, he had a replacement of the L5-S1 fusion segment and bone grafting was performed simultaneously.  Per the operative note reviewed, the broken rod was left intact at that time.  He reports several partial or near falls following this procedure, but he denies any total falls or direct trauma to his back in the past several years.  As his back pain and left leg radicular symptoms gradually returned, further imaging was performed that suggested screw loosening at the T9-T11 section of his spinal fusion.  On February 12, 2019 he returned to the OR with Dr. Louanne Skye and was found to have screw loosening at the same levels, so his fusion was extended superiorly to T5 and bone grafting was performed.  Initially, the patient had a good postoperative course, but over weeks 2 and 3 postoperatively, he began developing pain in his back at his operative site, prompting plain films to be obtained.  X-rays showed a loss of fixation of the opposite left internal rod fastener at the site of his severe back pain and the development of acute loosening of the interval rod  fasteners and loss of fixation.  On March 12, 2019, he was taken back to the operating room where it was noted that his Harrington rod had loosened and nearly entirely migrated out of position.  A culture was obtained near the pedicle screw at T10 as a small amount of soft tissue with significant bone osteomalacia was observed.  Given concern for his spinal stability, his recently placed Harrington rods were removed and revised/replaced.  Operative cultures were followed and remained negative at the time of his hospital discharge on March 14, 2019.  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    He was then placed on ceftriaxone in October with improvement in his symptoms and his inflammatory markers.  He completed his the ceftriaxone and changed over to doxycycline.  He saw Dr. Linus Salmons in February and seemed to be doing relatively well.  Since then he did have some episodes of feeling poorly and feeling flushed and feeling like "something not right I think I am sick".   Dr. 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 Extensive ventral and dorsal epidural thickening and enhancement around the cord from T4 there approximately T6 compatible with epidural infection. And probable osteomyelitis of T6.  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  Dr.  Louanne Skye called me arrange for the patient be seen in clinic today.  On exam he appears stable and does not have any new neurological deficits.  Dr. Louanne Skye was going to be in touch with radiology to see if they might build a sample a disc for culture.  We obtained blood cultures [facility] and these were with no out any growth.  Interventional radiology could not aspirate the disc base for culture and we had been for initiated  him on ceftriaxone on April 6.  He was tolerating the antibiotics foot relatively well though he complains of some dark stools that he thinks might be due to the antibiotics.  He has continued ceftriaxone and then changed over to doxycycline.  MRI of the spine is been performed on Dec 06, 2019 and I have reviewed and agree with the findings when they are official report is showing  T4-5 discitis osteomyelitis. Decreased conspicuity of ventral epidural abscess, epidural phlegmon and spinal canal narrowing at the T4-5 level.  T4-5 cord edema, unchanged.  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  Partially imaged posterior spinal fusion hardware. Please note lack of intravenous contrast and susceptibility artifact limits Evaluation.  Feel profoundly fatigued and apparently had lost weight over the last several months.  He has some dizziness which seems to have preceded him being on doxycycline but there may be some component of the doxycycline versus opiate that he is taking.    Past Medical History:  Diagnosis Date  . Acute pharyngitis 10/21/2013  . Allergy    grass and pollen  . Anxiety and depression 10/25/2011  . BPH (benign prostatic hyperplasia) 04/23/2012  . Chicken pox as a child  . DDD (degenerative disc disease)    cervical responds to steroid injections and low back required surgery  . DDD (degenerative disc disease), lumbosacral   . Diabetes mellitus    pre  . Dyspnea   . ED (erectile dysfunction) 04/23/2012  . Elevated BP   . 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 Esophageal reflux 02/10/2015  . Fatigue   . HTN (hypertension)   . Hyperglycemia    preDM   . Hyperlipidemia   . Insomnia   . Low back pain radiating to both legs 01/16/2017  . Measles as a child  . Medicare annual wellness visit, subsequent 02/10/2015  . Overweight(278.02)   . Peripheral neuropathy 08/07/2019  . Personal history of colonic polyps 10/27/2012   Follows with [facility]  .  Pneumonia    " walking"  . Preventative health care   . 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  . Wears glasses     Past Surgical History:  Procedure Laterality Date  . BACK SURGERY  2012 and 1994   Dr Margret Chance, screws and cage in low back  . EYE SURGERY Bilateral    2016  . HARDWARE REVISION  03/12/2019   REVISION OF HARDWARE THORACIC TEN-THORACIC ELEVEN WITH APPLICATION OF ADDITIONAL RODS AND ROD SLEEVES THORACIC EIGHT-THORACIC TWELVE 12-LUMBAR ONE (N/A )  . LAMINECTOMY  02/12/2019   DECOMPRESSIVE LAMINECTOMY THORACIC NINE-THORACIC TEN AND THORACIC TEN-THORACIC ELEVEN, EXTENSION OF THORACOLUMBAR FUSION FROM THORACIC TEN TO THORACIC FIVE, LOCAL BONE GRAFT, ALLOGRAFT AND VIVIGEN (N/A)  . SPINAL FUSION N/A 02/12/2019   Procedure: DECOMPRESSIVE LAMINECTOMY THORACIC NINE-THORACIC TEN AND THORACIC TEN-THORACIC ELEVEN, EXTENSION OF THORACOLUMBAR FUSION FROM THORACIC TEN TO THORACIC FIVE, LOCAL BONE GRAFT, ALLOGRAFT AND VIVIGEN;  Surgeon: Jessy Oto, MD;  Location: [place];  Service: Orthopedics;  Laterality: N/A;  DECOMPRESSIVE LAMINECTOMY THORACIC NINE-THORACIC TEN  AND THORACIC TEN-THORACIC ELEVEN, EXTENSION OF TH  . SPINAL FUSION N/A 03/12/2019   Procedure: REVISION OF HARDWARE THORACIC TEN-THORACIC ELEVEN WITH APPLICATION OF ADDITIONAL RODS AND ROD SLEEVES THORACIC EIGHT-THORACIC TWELVE 12-LUMBAR ONE;  Surgeon: Jessy Oto, MD;  Location: [place];  Service: Orthopedics;  Laterality: N/A;  . TONSILLECTOMY    . TONSILLECTOMY     as child  . torn rotator cuff  2010   right    Family History  Problem Relation Age of Onset  . Hypertension Mother   . Diabetes Mother        type 2  . Cancer Mother 62       breast in remission  . Emphysema Father        smoker  . COPD Father        smoker  . Stroke Father 73       mini  . Heart disease Father   . Hypertension Sister   . Hyperlipidemia Sister   . Scoliosis Sister   . Osteoporosis Sister   . Hypertension Maternal Grandmother   .  Scoliosis Maternal Grandmother   . Heart disease Maternal Grandfather   . Heart disease Paternal Grandfather        smoker  . Heart disease Daughter       Social History   Socioeconomic History  . Marital status: Married    Spouse name: Not on file  . Number of children: Not on file  . Years of education: Not on file  . Highest education level: Not on file  Occupational History  . Not on file  Tobacco Use  . Smoking status: Former Smoker    Packs/day: 1.00    Years: 20.00    Pack years: 20.00    Types: Cigarettes    Quit date: 07/18/1989    Years since quitting: 30.4  . Smokeless tobacco: Never Used  . Tobacco comment: off and on smoking for the 20 yrs  Substance and Sexual Activity  . Alcohol use: Yes    Comment: drink or two a day- mixed drink  . Drug use: No  . Sexual activity: Yes    Comment: lives with wife, still working, no dietary restrictions, continues to exercise intermittently  Other Topics Concern  . Not on file  Social History Narrative  . Not on file   Social Determinants of Health   Financial Resource Strain: Low Risk   . Difficulty of Paying Living Expenses: Not hard at all  Food Insecurity:   . Worried About Charity fundraiser in the Last Year:   . Arboriculturist in the Last Year:   Transportation Needs: No Transportation Needs  . Lack of Transportation (Medical): No  . Lack of Transportation (Non-Medical): No  Physical Activity:   . Days of Exercise per Week:   . Minutes of Exercise per Session:   Stress:   . Feeling of Stress :   Social Connections:   . Frequency of Communication with Friends and Family:   . Frequency of Social Gatherings with Friends and Family:   . Attends Religious Services:   . Active Member of Clubs or Organizations:   . Attends Archivist Meetings:   Marland Kitchen Marital Status:     No Known Allergies   Current Outpatient Medications:  .  albuterol (VENTOLIN HFA) 108 (90 Base) MCG/ACT inhaler, Inhale 2 puffs  into the lungs every 6 (six) hours as needed., Disp: 18 g, Rfl: 0 .  atorvastatin (LIPITOR) 20 MG tablet, TAKE 1/2 TABLET BY MOUTH DAILY AT 6 PM, Disp: 45 tablet, Rfl: 0 .  busPIRone (BUSPAR) 7.5 MG tablet, 1-2 TAB BY MOUTH 3 TIMES A DAY FOR ANXIETY, Disp: 540 tablet, Rfl: 1 .  cefTRIAXone (ROCEPHIN) 10 g injection, , Disp: , Rfl:  .  cetirizine (ZYRTEC) 10 MG tablet, Take 10 mg by mouth daily. In the morning, Disp: , Rfl:  .  diazepam (VALIUM) 10 MG tablet, TAKE 1 TABLET(10 MG) BY MOUTH AT BEDTIME AS NEEDED, Disp: 30 tablet, Rfl: 1 .  doxycycline (VIBRA-TABS) 100 MG tablet, Take 1 tablet (100 mg total) by mouth 2 (two) times daily., Disp: 60 tablet, Rfl: 11 .  famotidine (PEPCID) 40 MG tablet, TAKE 1 TABLET BY MOUTH EVERYDAY AT BEDTIME, Disp: 90 tablet, Rfl: 1 .  ferrous fumarate (FERRETTS) 325 (106 Fe) MG TABS tablet, Take 1 tablet (106 mg of iron total) by mouth 2 (two) times daily., Disp: 60 tablet, Rfl: 2 .  glucose blood (CONTOUR NEXT TEST) test strip, Check blood sugar once daily, Disp: 100 each, Rfl: 12 .  HYDROcodone-acetaminophen (NORCO) 10-325 MG tablet, Take 1 tablet by mouth every 6 (six) hours as needed., Disp: 30 tablet, Rfl: 0 .  ibuprofen (ADVIL) 600 MG tablet, Take 1 tablet (600 mg total) by mouth every 8 (eight) hours as needed., Disp: 90 tablet, Rfl: 0 .  lisinopril (ZESTRIL) 5 MG tablet, TAKE 1 TABLET BY MOUTH TWICE A DAY, Disp: 180 tablet, Rfl: 1 .  meclizine (ANTIVERT) 12.5 MG tablet, Take 1 tablet (12.5 mg total) by mouth 3 (three) times daily as needed for dizziness., Disp: 30 tablet, Rfl: 0 .  metFORMIN (GLUCOPHAGE) 1000 MG tablet, Take 1 tablet (1,000 mg total) by mouth 2 (two) times daily with a meal., Disp: 60 tablet, Rfl: 0 .  pregabalin (LYRICA) 75 MG capsule, Take 1 capsule (75 mg total) by mouth 2 (two) times daily., Disp: 60 capsule, Rfl: 3 .  sitaGLIPtin (JANUVIA) 50 MG tablet, Take 1 tablet (50 mg total) by mouth daily., Disp: 30 tablet, Rfl: 3 .  tamsulosin  (FLOMAX) 0.4 MG CAPS capsule, Take 2 capsules (0.8 mg total) by mouth daily., Disp: 180 capsule, Rfl: 0 .  testosterone cypionate (DEPOTESTOSTERONE CYPIONATE) 200 MG/ML injection, Inject 1 mL (200 mg total) into the muscle every 14 (fourteen) days., Disp: 2 mL, Rfl: 5 .  tiZANidine (ZANAFLEX) 4 MG tablet, TAKE 1.5 TABLETS (6 MG TOTAL) BY MOUTH EVERY 6 (SIX) HOURS AS NEEDED FOR MUSCLE SPASMS., Disp: 90 tablet, Rfl: 1 .  venlafaxine XR (EFFEXOR-XR) 150 MG 24 hr capsule, Take 1 capsule (150 mg total) by mouth daily with breakfast., Disp: 90 capsule, Rfl: 0  Current Facility-Administered Medications:  .  cefTRIAXone (ROCEPHIN) 2 g in dextrose 5 % 50 mL IVPB, 2 g, Intravenous, Q24H, Powers, Evern Core, MD .  testosterone cypionate (DEPOTESTOSTERONE CYPIONATE) injection 200 mg, 200 mg, Intramuscular, Q14 Days, Saguier, Edward, PA-C, 200 mg at 07/16/19 0942 .  testosterone cypionate (DEPOTESTOSTERONE CYPIONATE) injection 200 mg, 200 mg, Intramuscular, Q14 Days, Saguier, Edward, PA-C, 200 mg at 11/21/19 0941   Review of Systems  Constitutional: Positive for fatigue. Negative for activity change, appetite change, chills, fever and unexpected weight change.  HENT: Negative for congestion, rhinorrhea, sinus pressure, sneezing, sore throat and trouble swallowing.   Eyes: Negative for photophobia and visual disturbance.  Respiratory: Negative for cough, chest tightness, shortness of breath, wheezing and stridor.   Cardiovascular: Negative for chest pain, palpitations and leg  swelling.  Gastrointestinal: Negative for abdominal distention, abdominal pain, anal bleeding, blood in stool, constipation, diarrhea, nausea and vomiting.  Genitourinary: Negative for difficulty urinating, dysuria, flank pain and hematuria.  Musculoskeletal: Positive for arthralgias and back pain. Negative for gait problem, joint swelling and myalgias.  Skin: Negative for color change, pallor, rash and wound.  Neurological: Positive for  dizziness. Negative for tremors, weakness and light-headedness.  Hematological: Negative for adenopathy. Does not bruise/bleed easily.  Psychiatric/Behavioral: Negative for agitation, behavioral problems, confusion, decreased concentration, dysphoric mood and sleep disturbance.       Objective:   Physical Exam Constitutional:      General: He is not in acute distress.    Appearance: Normal appearance. He is well-developed. He is not ill-appearing or diaphoretic.  HENT:     Head: Normocephalic and atraumatic.     Right Ear: Hearing and external ear normal.     Left Ear: Hearing and external ear normal.     Nose: No nasal deformity or rhinorrhea.  Eyes:     General: No scleral icterus.    Extraocular Movements: Extraocular movements intact.     Conjunctiva/sclera: Conjunctivae normal.     Right eye: Right conjunctiva is not injected.     Left eye: Left conjunctiva is not injected.  Neck:     Vascular: No JVD.  Cardiovascular:     Rate and Rhythm: Normal rate and regular rhythm.     Heart sounds: S1 normal and S2 normal. No friction rub.  Pulmonary:     Effort: Pulmonary effort is normal. No respiratory distress.  Abdominal:     General: There is no distension.     Palpations: Abdomen is soft.  Musculoskeletal:        General: Normal range of motion.     Right shoulder: Normal.     Left shoulder: Normal.     Cervical back: Normal range of motion and neck supple.     Right hip: Normal.     Left hip: Normal.     Right knee: Normal.     Left knee: Normal.  Lymphadenopathy:     Head:     Right side of head: No submandibular, preauricular or posterior auricular adenopathy.     Left side of head: No submandibular, preauricular or posterior auricular adenopathy.     Cervical: No cervical adenopathy.     Right cervical: No superficial or deep cervical adenopathy.    Left cervical: No superficial or deep cervical adenopathy.  Skin:    General: Skin is warm and dry.      Coloration: Skin is not pale.     Findings: No abrasion, bruising, ecchymosis, erythema, lesion or rash.     Nails: There is no clubbing.  Neurological:     General: No focal deficit present.     Mental Status: He is alert and oriented to person, place, and time.     Sensory: No sensory deficit.     Coordination: Coordination normal.     Gait: Gait normal.  Psychiatric:        Attention and Perception: He is attentive.        Mood and Affect: Mood is depressed.        Speech: Speech normal.        Behavior: Behavior normal. Behavior is cooperative.        Thought Content: Thought content normal.        Judgment: Judgment normal.  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  Because of his symptoms of dizziness I will change him from doxycycline to amoxicillin 3 times a day which should be highly active against his organism.  Dizziness: Likely multifactorial defer to primary on this  Fatigue also likely multifactorial he wonders about his testosterone blade being a factor and I have recommended that he have this checked by his primary care physician at an early a.m. lab.  Severe back pain: Patient and wife are asking for other recommendations on other providers a good help with maximizing his improvement.  I recommended considering PMNR if it would work well with Dr. Louanne Skye

## 2019-12-10 NOTE — Telephone Encounter (Signed)
Dr. Louanne Skye called patient yesterday evening

## 2019-12-11 LAB — CBC WITH DIFFERENTIAL/PLATELET
Absolute Monocytes: 713 cells/uL (ref 200–950)
Basophils Absolute: 38 cells/uL (ref 0–200)
Basophils Relative: 0.5 %
Eosinophils Absolute: 150 cells/uL (ref 15–500)
Eosinophils Relative: 2 %
HCT: 37.4 % — ABNORMAL LOW (ref 38.5–50.0)
Hemoglobin: 12.3 g/dL — ABNORMAL LOW (ref 13.2–17.1)
Lymphs Abs: 705 cells/uL — ABNORMAL LOW (ref 850–3900)
MCH: 29.1 pg (ref 27.0–33.0)
MCHC: 32.9 g/dL (ref 32.0–36.0)
MCV: 88.6 fL (ref 80.0–100.0)
MPV: 10 fL (ref 7.5–12.5)
Monocytes Relative: 9.5 %
Neutro Abs: 5895 cells/uL (ref 1500–7800)
Neutrophils Relative %: 78.6 %
Platelets: 311 10*3/uL (ref 140–400)
RBC: 4.22 10*6/uL (ref 4.20–5.80)
RDW: 15.6 % — ABNORMAL HIGH (ref 11.0–15.0)
Total Lymphocyte: 9.4 %
WBC: 7.5 10*3/uL (ref 3.8–10.8)

## 2019-12-11 LAB — BASIC METABOLIC PANEL WITH GFR
BUN: 9 mg/dL (ref 7–25)
CO2: 27 mmol/L (ref 20–32)
Calcium: 9.3 mg/dL (ref 8.6–10.3)
Chloride: 95 mmol/L — ABNORMAL LOW (ref 98–110)
Creat: 0.82 mg/dL (ref 0.70–1.18)
GFR, Est African American: 100 mL/min/{1.73_m2} (ref >=60)
GFR, Est Non African American: 86 mL/min/{1.73_m2} (ref >=60)
Glucose, Bld: 201 mg/dL — ABNORMAL HIGH (ref 65–99)
Potassium: 4.5 mmol/L (ref 3.5–5.3)
Sodium: 130 mmol/L — ABNORMAL LOW (ref 135–146)

## 2019-12-11 LAB — C-REACTIVE PROTEIN: CRP: 16.3 mg/L — ABNORMAL HIGH (ref <8.0)

## 2019-12-11 LAB — SEDIMENTATION RATE: Sed Rate: 6 mm/h (ref 0–20)

## 2019-12-12 ENCOUNTER — Encounter: Payer: Self-pay | Admitting: Specialist

## 2019-12-12 ENCOUNTER — Other Ambulatory Visit: Payer: Self-pay

## 2019-12-12 ENCOUNTER — Ambulatory Visit: Payer: Medicare Other | Admitting: Specialist

## 2019-12-12 ENCOUNTER — Other Ambulatory Visit: Payer: Self-pay | Admitting: Specialist

## 2019-12-12 VITALS — BP 70/47 | HR 79 | Ht 70.0 in | Wt 181.0 lb

## 2019-12-12 DIAGNOSIS — T8484XA Pain due to internal orthopedic prosthetic devices, implants and grafts, initial encounter: Secondary | ICD-10-CM

## 2019-12-12 DIAGNOSIS — R200 Anesthesia of skin: Secondary | ICD-10-CM | POA: Diagnosis not present

## 2019-12-12 DIAGNOSIS — M4804 Spinal stenosis, thoracic region: Secondary | ICD-10-CM

## 2019-12-12 DIAGNOSIS — Z981 Arthrodesis status: Secondary | ICD-10-CM | POA: Diagnosis not present

## 2019-12-12 DIAGNOSIS — M4644 Discitis, unspecified, thoracic region: Secondary | ICD-10-CM

## 2019-12-12 DIAGNOSIS — M40294 Other kyphosis, thoracic region: Secondary | ICD-10-CM | POA: Diagnosis not present

## 2019-12-12 DIAGNOSIS — R202 Paresthesia of skin: Secondary | ICD-10-CM

## 2019-12-12 NOTE — Progress Notes (Signed)
Office Visit Note   Patient: Luis Brown           Date of Birth: [birth year]           MRN: [id number] Visit Date: 12/12/2019              Requested by: Mackie Pai, PA-C [place],  [place] [id number] PCP: Mackie Pai, PA-C   Assessment & Plan: Visit Diagnoses:  1. Status post thoracic spinal fusion   2. Painful orthopaedic hardware ([place])   3. Arthrodesis status   4. Other kyphosis, thoracic region   5. Thoracic spinal stenosis   6. Numbness and tingling of both legs     Plan: Avoid frequent bending and stooping  No lifting greater than 10 lbs. May use ice or moist heat for pain. Weight loss is of benefit.  Exercise is important to improve your indurance and does allow people to function better inspite of back pain. Referral to pain management. Call your primary call MD and ask him to stop antihypertensive and see if meds need adjustment due to weight loss, BP and  Blood sugars are likely under control, sed rate is normal.   Follow-Up Instructions: No follow-ups on file.   Orders:  No orders of the defined types were placed in this encounter.  No orders of the defined types were placed in this encounter.     Procedures: No procedures performed   Clinical Data: No additional findings.   Subjective: Chief Complaint  Patient presents with  . Spine - Follow-up    MRI Review Tspine    74 year old male with history of thoracolumbar fusion with loosening of hardware T5 and DDD and discitis at the T4-5 level. His sed rate and CRP were elevated post IV antibiotic and the adjacent level discitis was found. He is on oral antibiotics and his meds are being changed to to ampicillin. He is experiencing fatigue. His pain is the same but his fatigue is becoming concerning. He has lost nearly 25 lbs And has a poor appetite.    Review of Systems  Constitutional: Positive for fatigue and unexpected weight change. Negative for activity  change, appetite change, chills, diaphoresis and fever.  HENT: Positive for hearing loss. Negative for congestion, dental problem, drooling, ear discharge, ear pain, facial swelling, mouth sores, nosebleeds, postnasal drip, rhinorrhea, sinus pressure, sinus pain, sneezing, sore throat, tinnitus, trouble swallowing and voice change.   Eyes: Negative.  Negative for photophobia, pain, discharge, redness, itching and visual disturbance.  Respiratory: Positive for shortness of breath. Negative for apnea, cough, choking, chest tightness, wheezing and stridor.   Cardiovascular: Negative.  Negative for chest pain, palpitations and leg swelling.  Gastrointestinal: Negative.  Negative for abdominal distention, abdominal pain, anal bleeding, blood in stool, constipation, diarrhea, nausea and rectal pain.  Endocrine: Negative.  Negative for cold intolerance, heat intolerance, polydipsia, polyphagia and polyuria.  Genitourinary: Negative.  Negative for difficulty urinating, dysuria, enuresis, flank pain, frequency, hematuria and urgency.  Musculoskeletal: Positive for back pain and gait problem. Negative for arthralgias, joint swelling, myalgias, neck pain and neck stiffness.  Skin: Negative.  Negative for color change, pallor, rash and wound.  Allergic/Immunologic: Negative.  Negative for environmental allergies, food allergies and immunocompromised state.  Neurological: Negative for dizziness, tremors, seizures, syncope, facial asymmetry, speech difficulty, weakness, light-headedness, numbness and headaches.  Hematological: Negative.  Negative for adenopathy. Does not bruise/bleed easily.  Psychiatric/Behavioral: Negative.  Negative for agitation, behavioral problems,  confusion, decreased concentration, dysphoric mood, hallucinations, self-injury, sleep disturbance and suicidal ideas. The patient is not nervous/anxious and is not hyperactive.      Objective: Vital Signs: BP (!) 70/47 (BP Location: Left Arm,  Patient Position: Sitting)   Pulse 79   Ht 5\' 10"  (1.778 m)   Wt 181 lb (82.1 kg)   BMI 25.97 kg/m   Physical Exam Constitutional:      Appearance: He is well-developed.  HENT:     Head: Normocephalic and atraumatic.  Eyes:     Pupils: Pupils are equal, round, and reactive to light.  Pulmonary:     Effort: Pulmonary effort is normal.     Breath sounds: Normal breath sounds.  Abdominal:     General: Bowel sounds are normal.     Palpations: Abdomen is soft.  Musculoskeletal:     Cervical back: Normal range of motion and neck supple.     Lumbar back: Negative right straight leg raise test and negative left straight leg raise test.  Skin:    General: Skin is warm and dry.  Neurological:     Mental Status: He is alert and oriented to person, place, and time.  Psychiatric:        Behavior: Behavior normal.        Thought Content: Thought content normal.        Judgment: Judgment normal.     Back Exam   Tenderness  The patient is experiencing tenderness in the lumbar.  Range of Motion  Extension: abnormal  Flexion: abnormal  Lateral bend right: abnormal  Lateral bend left: abnormal  Rotation right: abnormal  Rotation left: abnormal   Muscle Strength  Right Quadriceps:  5/5  Left Quadriceps:  5/5  Right Hamstrings:  5/5  Left Hamstrings:  5/5   Tests  Straight leg raise right: negative Straight leg raise left: negative  Reflexes  Patellar: 0/4 Achilles: 0/4 Babinski's sign: normal   Other  Toe walk: abnormal Heel walk: abnormal Sensation: decreased Gait: abnormal  Erythema: no back redness Scars: absent      Specialty Comments:  No specialty comments available.  Imaging: No results found.   PMFS History: Patient Active Problem List   Diagnosis Date Noted  . 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 Medication monitoring encounter 08/26/2019  . Peripheral neuropathy 08/07/2019  . Alcohol abuse 05/09/2019  . Hyponatremia 05/05/2019  . 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 Fusion of spine of thoracolumbar region 03/12/2019  . Status post thoracic spinal fusion 02/12/2019  . 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 Thoracic spinal stenosis 04/17/2017  . Painful orthopaedic hardware ([place]) 04/17/2017  . 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 ([place]) 04/17/2017  . Low back pain radiating to both legs 01/16/2017  . Esophageal reflux 02/10/2015  . Medicare annual wellness visit, subsequent 02/10/2015  . Personal history of colonic polyps 10/27/2012  . 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  . BPH (benign prostatic hyperplasia) 04/23/2012  . ED (erectile dysfunction) 04/23/2012  . Allergic state 10/25/2011  . Anxiety and depression 10/25/2011  . Asthma   . Diabetes mellitus without complication ([place])   . Hyperlipidemia   . Overweight   . Insomnia   . Fatigue   . Preventative health care   . DDD (degenerative disc disease), lumbosacral   . Thoracic degenerative disc disease   . HTN (hypertension)    Past Medical History:  Diagnosis Date  .  Acute pharyngitis 10/21/2013  . Allergy    grass and pollen  . Anxiety and depression 10/25/2011  . BPH (benign prostatic hyperplasia) 04/23/2012  . Chicken pox as a child  . DDD (degenerative disc disease)    cervical responds to steroid injections and low back required surgery  . DDD (degenerative disc disease), lumbosacral   . Diabetes mellitus    pre  . Dyspnea   . ED (erectile dysfunction) 04/23/2012  . Elevated BP   . 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 Esophageal reflux 02/10/2015  . Fatigue   . HTN (hypertension)   . Hyperglycemia    preDM   . Hyperlipidemia   . Insomnia   . Low back pain radiating to both legs 01/16/2017  . Measles as a child  . Medicare annual wellness visit, subsequent 02/10/2015  . Overweight(278.02)   . Peripheral neuropathy 08/07/2019  . Personal history of  colonic polyps 10/27/2012   Follows with [facility]  . Pneumonia    " walking"  . Preventative health care   . 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  . Wears glasses     Family History  Problem Relation Age of Onset  . Hypertension Mother   . Diabetes Mother        type 2  . Cancer Mother 82       breast in remission  . Emphysema Father        smoker  . COPD Father        smoker  . Stroke Father 65       mini  . Heart disease Father   . Hypertension Sister   . Hyperlipidemia Sister   . Scoliosis Sister   . Osteoporosis Sister   . Hypertension Maternal Grandmother   . Scoliosis Maternal Grandmother   . Heart disease Maternal Grandfather   . Heart disease Paternal Grandfather        smoker  . Heart disease Daughter     Past Surgical History:  Procedure Laterality Date  . BACK SURGERY  2012 and 1994   Dr Margret Chance, screws and cage in low back  . EYE SURGERY Bilateral    2016  . HARDWARE REVISION  03/12/2019   REVISION OF HARDWARE THORACIC TEN-THORACIC ELEVEN WITH APPLICATION OF ADDITIONAL RODS AND ROD SLEEVES THORACIC EIGHT-THORACIC TWELVE 12-LUMBAR ONE (N/A )  . LAMINECTOMY  02/12/2019   DECOMPRESSIVE LAMINECTOMY THORACIC NINE-THORACIC TEN AND THORACIC TEN-THORACIC ELEVEN, EXTENSION OF THORACOLUMBAR FUSION FROM THORACIC TEN TO THORACIC FIVE, LOCAL BONE GRAFT, ALLOGRAFT AND VIVIGEN (N/A)  . SPINAL FUSION N/A 02/12/2019   Procedure: DECOMPRESSIVE LAMINECTOMY THORACIC NINE-THORACIC TEN AND THORACIC TEN-THORACIC ELEVEN, EXTENSION OF THORACOLUMBAR FUSION FROM THORACIC TEN TO THORACIC FIVE, LOCAL BONE GRAFT, ALLOGRAFT AND VIVIGEN;  Surgeon: Jessy Oto, MD;  Location: [facility];  Service: Orthopedics;  Laterality: N/A;  DECOMPRESSIVE LAMINECTOMY THORACIC NINE-THORACIC TEN AND THORACIC TEN-THORACIC ELEVEN, EXTENSION OF TH  . SPINAL FUSION N/A 03/12/2019   Procedure: REVISION OF HARDWARE THORACIC TEN-THORACIC ELEVEN WITH APPLICATION OF ADDITIONAL RODS AND ROD SLEEVES THORACIC  EIGHT-THORACIC TWELVE 12-LUMBAR ONE;  Surgeon: Jessy Oto, MD;  Location: [facility];  Service: Orthopedics;  Laterality: N/A;  . TONSILLECTOMY    . TONSILLECTOMY     as child  . torn rotator cuff  2010   right   Social History   Occupational History  . Not on file  Tobacco Use  . Smoking status: Former Smoker    Packs/day: 1.00    Years: 20.00    Pack  years: 20.00    Types: Cigarettes    Quit date: 07/18/1989    Years since quitting: 30.4  . Smokeless tobacco: Never Used  . Tobacco comment: off and on smoking for the 20 yrs  Substance and Sexual Activity  . Alcohol use: Yes    Comment: drink or two a day- mixed drink  . Drug use: No  . Sexual activity: Yes    Comment: lives with wife, still working, no dietary restrictions, continues to exercise intermittently

## 2019-12-12 NOTE — Patient Instructions (Signed)
Avoid frequent bending and stooping  No lifting greater than 10 lbs. May use ice or moist heat for pain. Weight loss is of benefit.  Exercise is important to improve your indurance and does allow people to function better inspite of back pain. Referral to pain management. Call your primary call MD and ask him to stop antihypertensive and see if meds need adjustment due to weight loss, BP and  Blood sugars are likely under control, sed rate is normal.

## 2019-12-13 ENCOUNTER — Telehealth: Payer: Self-pay

## 2019-12-13 MED ORDER — HYDROCODONE-ACETAMINOPHEN 10-325 MG PO TABS: 1.0000 | ORAL_TABLET | Freq: Four times a day (QID) | ORAL | 0 refills | Status: DC | PRN

## 2019-12-13 NOTE — Telephone Encounter (Signed)
Hey Lurena Joiner this is a [facility] patient that has been from Mid-Thoracic to bottom on lumbar.  He does have infection at the top of the fusion area that he is seeing ID for.  Can you please refill his pain meds since Dr. Louanne Skye is out of the office?

## 2019-12-13 NOTE — Telephone Encounter (Signed)
[place] reasonable. Thanks.

## 2019-12-13 NOTE — Telephone Encounter (Signed)
Spoke with patient regarding symptoms (pt scheduled for 12/18/19 for low BP, fatigue and slurred speech). Patient reports increase fatigue and weakness x 1 month. While at ortho office yesterday, BP was 70/47 and slurred speech was noticed. Patient reports somewhat better today, not as weak/tired, slurred speech resolved. Patient currently prescribed Lisinopril 5mg  BID, he only took one dose yesterday, none today. BP today is 123/70. Advised per PCP to stop Lisinopril.   Encouraged patient to be evaluated in ER/UC for symptoms, patient declines d/t feeling better.  Advised to check BP twice daily, bring results to appointment. Also advised if BP low again and symptomatic, to go to ER for evaluation. Patient verbalized understanding and agrees with plan.   Patient would like testosterone level checked. Currently receives testosterone 200mg  injections every 14 days.

## 2019-12-16 ENCOUNTER — Other Ambulatory Visit: Payer: Self-pay | Admitting: Medical

## 2019-12-16 ENCOUNTER — Emergency Department (HOSPITAL_COMMUNITY)
Admission: EM | Admit: 2019-12-16 | Discharge: 2019-12-16 | Disposition: A | Discharge status: Home / Self Care | Payer: Medicare Other | Attending: Emergency Medicine | Admitting: Emergency Medicine

## 2019-12-16 ENCOUNTER — Other Ambulatory Visit: Payer: Self-pay

## 2019-12-16 DIAGNOSIS — R55 Syncope and collapse: Secondary | ICD-10-CM | POA: Insufficient documentation

## 2019-12-16 DIAGNOSIS — Z5321 Procedure and treatment not carried out due to patient leaving prior to being seen by health care provider: Secondary | ICD-10-CM | POA: Insufficient documentation

## 2019-12-16 LAB — BASIC METABOLIC PANEL
Anion gap: 10 (ref 5–15)
BUN: 8 mg/dL (ref 8–23)
CO2: 28 mmol/L (ref 22–32)
Calcium: 9.1 mg/dL (ref 8.9–10.3)
Chloride: 95 mmol/L — ABNORMAL LOW (ref 98–111)
Creatinine, Ser: 0.74 mg/dL (ref 0.61–1.24)
GFR calc Af Amer: >60 mL/min (ref >60)
GFR calc non Af Amer: >60 mL/min (ref >60)
Glucose, Bld: 212 mg/dL — ABNORMAL HIGH (ref 70–99)
Potassium: 4.8 mmol/L (ref 3.5–5.1)
Sodium: 133 mmol/L — ABNORMAL LOW (ref 135–145)

## 2019-12-16 LAB — CBC
HCT: 39.4 % (ref 39.0–52.0)
Hemoglobin: 12.7 g/dL — ABNORMAL LOW (ref 13.0–17.0)
MCH: 29.2 pg (ref 26.0–34.0)
MCHC: 32.2 g/dL (ref 30.0–36.0)
MCV: 90.6 fL (ref 80.0–100.0)
Platelets: 285 10*3/uL (ref 150–400)
RBC: 4.35 MIL/uL (ref 4.22–5.81)
RDW: 15.9 % — ABNORMAL HIGH (ref 11.5–15.5)
WBC: 7.5 10*3/uL (ref 4.0–10.5)
nRBC: 0 % (ref 0.0–0.2)

## 2019-12-16 MED ORDER — SODIUM CHLORIDE 0.9% FLUSH: 3.0000 mL | Freq: Once | INTRAVENOUS | Status: DC

## 2019-12-16 NOTE — ED Notes (Signed)
Pt came to this tech stating he must leave to take care of his dogs. Pt was encouraged to stay, pt left.

## 2019-12-16 NOTE — ED Triage Notes (Signed)
Patient complains of multiple near and complete syncopal episodes over the last several days. Patient states he has an appointment to see his PCP tomorrow for the same but after another episode today patient decided to be evaluated at ED. Patient alert, oriented, and in no apparent distress at this time. Patient states that episodes begin when he gets up after being seated for a while or when he focuses to intensely on one thing.

## 2019-12-17 ENCOUNTER — Encounter: Payer: Self-pay | Admitting: Medical

## 2019-12-17 ENCOUNTER — Ambulatory Visit (HOSPITAL_BASED_OUTPATIENT_CLINIC_OR_DEPARTMENT_OTHER)
Admission: RE | Admit: 2019-12-17 | Discharge: 2019-12-17 | Disposition: A | Discharge status: Home / Self Care | Payer: Medicare Other | Source: Ambulatory Visit | Attending: Medical | Admitting: Medical

## 2019-12-17 ENCOUNTER — Ambulatory Visit (INDEPENDENT_AMBULATORY_CARE_PROVIDER_SITE_OTHER): Payer: Medicare Other | Admitting: Medical

## 2019-12-17 ENCOUNTER — Other Ambulatory Visit: Payer: Medicare Other

## 2019-12-17 ENCOUNTER — Other Ambulatory Visit: Payer: Self-pay

## 2019-12-17 VITALS — BP 115/60 | HR 77 | Temp 97.0°F | Resp 18 | Ht 70.0 in | Wt 174.6 lb

## 2019-12-17 DIAGNOSIS — R7989 Other specified abnormal findings of blood chemistry: Secondary | ICD-10-CM | POA: Diagnosis not present

## 2019-12-17 DIAGNOSIS — M4624 Osteomyelitis of vertebra, thoracic region: Secondary | ICD-10-CM | POA: Diagnosis not present

## 2019-12-17 DIAGNOSIS — R55 Syncope and collapse: Secondary | ICD-10-CM | POA: Diagnosis not present

## 2019-12-17 DIAGNOSIS — R4781 Slurred speech: Secondary | ICD-10-CM

## 2019-12-17 DIAGNOSIS — E119 Type 2 diabetes mellitus without complications: Secondary | ICD-10-CM

## 2019-12-17 DIAGNOSIS — R42 Dizziness and giddiness: Secondary | ICD-10-CM | POA: Diagnosis not present

## 2019-12-17 DIAGNOSIS — M869 Osteomyelitis, unspecified: Secondary | ICD-10-CM

## 2019-12-17 DIAGNOSIS — R5383 Other fatigue: Secondary | ICD-10-CM | POA: Diagnosis not present

## 2019-12-17 LAB — CBC WITH DIFFERENTIAL/PLATELET
Basophils Absolute: 0 10*3/uL (ref 0.0–0.1)
Basophils Relative: 0.7 % (ref 0.0–3.0)
Eosinophils Absolute: 0.1 10*3/uL (ref 0.0–0.7)
Eosinophils Relative: 1.8 % (ref 0.0–5.0)
HCT: 36.6 % — ABNORMAL LOW (ref 39.0–52.0)
Hemoglobin: 12.2 g/dL — ABNORMAL LOW (ref 13.0–17.0)
Lymphocytes Relative: 7.7 % — ABNORMAL LOW (ref 12.0–46.0)
Lymphs Abs: 0.5 10*3/uL — ABNORMAL LOW (ref 0.7–4.0)
MCHC: 33.4 g/dL (ref 30.0–36.0)
MCV: 89.5 fl (ref 78.0–100.0)
Monocytes Absolute: 0.8 10*3/uL (ref 0.1–1.0)
Monocytes Relative: 11.7 % (ref 3.0–12.0)
Neutro Abs: 5.5 10*3/uL (ref 1.4–7.7)
Neutrophils Relative %: 78.1 % — ABNORMAL HIGH (ref 43.0–77.0)
Platelets: 268 10*3/uL (ref 150.0–400.0)
RBC: 4.09 Mil/uL — ABNORMAL LOW (ref 4.22–5.81)
RDW: 17.7 % — ABNORMAL HIGH (ref 11.5–15.5)
WBC: 7 10*3/uL (ref 4.0–10.5)

## 2019-12-17 LAB — COMPREHENSIVE METABOLIC PANEL
ALT: 10 U/L (ref 0–53)
AST: 16 U/L (ref 0–37)
Albumin: 4.3 g/dL (ref 3.5–5.2)
Alkaline Phosphatase: 77 U/L (ref 39–117)
BUN: 10 mg/dL (ref 6–23)
CO2: 29 mEq/L (ref 19–32)
Calcium: 9.2 mg/dL (ref 8.4–10.5)
Chloride: 93 mEq/L — ABNORMAL LOW (ref 96–112)
Creatinine, Ser: 0.78 mg/dL (ref 0.40–1.50)
GFR: 96.69 mL/min (ref >60.00)
Glucose, Bld: 210 mg/dL — ABNORMAL HIGH (ref 70–99)
Potassium: 3.9 mEq/L (ref 3.5–5.1)
Sodium: 129 mEq/L — ABNORMAL LOW (ref 135–145)
Total Bilirubin: 0.7 mg/dL (ref 0.2–1.2)
Total Protein: 6.7 g/dL (ref 6.0–8.3)

## 2019-12-17 LAB — C-REACTIVE PROTEIN: CRP: 2 mg/dL (ref 0.5–20.0)

## 2019-12-17 LAB — SEDIMENTATION RATE: Sed Rate: 11 mm/hr (ref 0–20)

## 2019-12-17 NOTE — Progress Notes (Signed)
Subjective:    [name], male    DOB: 19-Jun-1946, 74 y.o.   MRN: [id number]  HPI   Pt in today.   Late last week before long holiday weekend I had advised to be seen in ED since he had low bp, fatigue and slurred speech.  Simonne Come RN advised that he be seen by ED per my instruction. Pt declined stating that he felt better after stopping lisinopril. She advised him to keep checking his bp twice daily.   Pt went to ED yesterday with  "Patient complains of multiple near and complete syncopal episodes over the last several days. Patient states he has an appointment to see his PCP tomorrow for the same but after another episode today patient decided to be evaluated at ED. Patient alert, oriented, and in no apparent distress at this time. Patient states that episodes begin when he gets up after being seated for a while or when he focuses to intensely on one thing"   Ekg showing nsr in ED but articfact.  Later in ED pt left stating he had to take care of his dogs.  So now pt here this morning.  Pt had low ED in specialist office.  Bp was 70/47 at ortho.  A and P from ortho  Assessment & Plan: Visit Diagnoses:  1. Status post thoracic spinal fusion   2. Painful orthopaedic hardware ([place])   3. Arthrodesis status   4. Other kyphosis, thoracic region   5. Thoracic spinal stenosis   6. Numbness and tingling of both legs     Pt also see by ID MD on 12/10/2019.  A and P plant from ED below.   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  Because of his symptoms of dizziness I will change him from doxycycline to amoxicillin 3 times a day which should be highly active against his organism.  Dizziness: Likely multifactorial defer to primary on this  Fatigue also likely multifactorial he wonders about his testosterone blade being a factor and I have recommended that he have this  checked by his primary care physician at an early a.m. lab.  Severe back pain: Patient and wife are asking for other recommendations on other providers a good help with maximizing his improvement.  I recommended considering PMNR if it would work well with Dr. Louanne Skye"   Pt states his bp have been low despite stopping the 105/58 on friday, 85/53 Saturday evening, 77/49 Monday am. , 88/60 at noon on Monday.  Pt passed out yesterday after bending over to pick up a dog toy. Stood up real quick and then passed out. Wife saw him and he did not hit head. He was out briefly for about only 2-3 seconds per wife.   He also tell me and wife today he did trip in yard and passed out.  Random episodes of slurred speech. He thinks when his blood pressure is low.  Review of Systems  Constitutional: Positive for fatigue. Negative for chills and unexpected weight change.  Respiratory: Negative for cough, chest tightness and wheezing.   Cardiovascular: Negative for chest pain and palpitations.  Gastrointestinal: Negative for abdominal pain and nausea.  Musculoskeletal: Positive for back pain.  Skin: Negative for rash.  Neurological: Positive for dizziness.       See hpi.  Not currently,  Hematological: Negative for adenopathy. Does not bruise/bleed easily.  Psychiatric/Behavioral: Positive for dysphoric mood. Negative for  behavioral problems and sleep disturbance.    Past Medical History:  Diagnosis Date  . Acute pharyngitis 10/21/2013  . Allergy    grass and pollen  . Anxiety and depression 10/25/2011  . BPH (benign prostatic hyperplasia) 04/23/2012  . Chicken pox as a child  . DDD (degenerative disc disease)    cervical responds to steroid injections and low back required surgery  . DDD (degenerative disc disease), lumbosacral   . Diabetes mellitus    pre  . Dyspnea   . ED (erectile dysfunction) 04/23/2012  . Elevated BP   . 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 Esophageal reflux 02/10/2015  . Fatigue    . HTN (hypertension)   . Hyperglycemia    preDM   . Hyperlipidemia   . Insomnia   . Low back pain radiating to both legs 01/16/2017  . Measles as a child  . Medicare annual wellness visit, subsequent 02/10/2015  . Overweight(278.02)   . Peripheral neuropathy 08/07/2019  . Personal history of colonic polyps 10/27/2012   Follows with [facility]  . Pneumonia    " walking"  . Preventative health care   . 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  . Wears glasses      Social History   Socioeconomic History  . Marital status: Married    Spouse name: Not on file  . Number of children: Not on file  . Years of education: Not on file  . Highest education level: Not on file  Occupational History  . Not on file  Tobacco Use  . Smoking status: Former Smoker    Packs/day: 1.00    Years: 20.00    Pack years: 20.00    Types: Cigarettes    Quit date: 07/18/1989    Years since quitting: 30.4  . Smokeless tobacco: Never Used  . Tobacco comment: off and on smoking for the 20 yrs  Substance and Sexual Activity  . Alcohol use: Yes    Comment: drink or two a day- mixed drink  . Drug use: No  . Sexual activity: Yes    Comment: lives with wife, still working, no dietary restrictions, continues to exercise intermittently  Other Topics Concern  . Not on file  Social History Narrative  . Not on file   Social Determinants of Health   Financial Resource Strain: Low Risk   . Difficulty of Paying Living Expenses: Not hard at all  Food Insecurity:   . Worried About Charity fundraiser in the Last Year:   . Arboriculturist in the Last Year:   Transportation Needs: No Transportation Needs  . Lack of Transportation (Medical): No  . Lack of Transportation (Non-Medical): No  Physical Activity:   . Days of Exercise per Week:   . Minutes of Exercise per Session:   Stress:   . Feeling of Stress :   Social Connections:   . Frequency of Communication with Friends and Family:   . Frequency of  Social Gatherings with Friends and Family:   . Attends Religious Services:   . Active Member of Clubs or Organizations:   . Attends Archivist Meetings:   Marland Kitchen Marital Status:   Intimate Partner Violence:   . Fear of Current or Ex-Partner:   . Emotionally Abused:   Marland Kitchen Physically Abused:   . Sexually Abused:     Past Surgical History:  Procedure Laterality Date  . BACK SURGERY  2012 and 1994   Dr Margret Chance, screws and cage in low back  .  EYE SURGERY Bilateral    2016  . HARDWARE REVISION  03/12/2019   REVISION OF HARDWARE THORACIC TEN-THORACIC ELEVEN WITH APPLICATION OF ADDITIONAL RODS AND ROD SLEEVES THORACIC EIGHT-THORACIC TWELVE 12-LUMBAR ONE (N/A )  . LAMINECTOMY  02/12/2019   DECOMPRESSIVE LAMINECTOMY THORACIC NINE-THORACIC TEN AND THORACIC TEN-THORACIC ELEVEN, EXTENSION OF THORACOLUMBAR FUSION FROM THORACIC TEN TO THORACIC FIVE, LOCAL BONE GRAFT, ALLOGRAFT AND VIVIGEN (N/A)  . SPINAL FUSION N/A 02/12/2019   Procedure: DECOMPRESSIVE LAMINECTOMY THORACIC NINE-THORACIC TEN AND THORACIC TEN-THORACIC ELEVEN, EXTENSION OF THORACOLUMBAR FUSION FROM THORACIC TEN TO THORACIC FIVE, LOCAL BONE GRAFT, ALLOGRAFT AND VIVIGEN;  Surgeon: Jessy Oto, MD;  Location: [facility];  Service: Orthopedics;  Laterality: N/A;  DECOMPRESSIVE LAMINECTOMY THORACIC NINE-THORACIC TEN AND THORACIC TEN-THORACIC ELEVEN, EXTENSION OF TH  . SPINAL FUSION N/A 03/12/2019   Procedure: REVISION OF HARDWARE THORACIC TEN-THORACIC ELEVEN WITH APPLICATION OF ADDITIONAL RODS AND ROD SLEEVES THORACIC EIGHT-THORACIC TWELVE 12-LUMBAR ONE;  Surgeon: Jessy Oto, MD;  Location: [place];  Service: Orthopedics;  Laterality: N/A;  . TONSILLECTOMY    . TONSILLECTOMY     as child  . torn rotator cuff  2010   right    Family History  Problem Relation Age of Onset  . Hypertension Mother   . Diabetes Mother        type 2  . Cancer Mother 65       breast in remission  . Emphysema Father        smoker  . COPD Father         smoker  . Stroke Father 44       mini  . Heart disease Father   . Hypertension Sister   . Hyperlipidemia Sister   . Scoliosis Sister   . Osteoporosis Sister   . Hypertension Maternal Grandmother   . Scoliosis Maternal Grandmother   . Heart disease Maternal Grandfather   . Heart disease Paternal Grandfather        smoker  . Heart disease Daughter     No Known Allergies  Current Outpatient Medications on File Prior to Visit  Medication Sig Dispense Refill  . albuterol (VENTOLIN HFA) 108 (90 Base) MCG/ACT inhaler Inhale 2 puffs into the lungs every 6 (six) hours as needed. 18 g 0  . amoxicillin (AMOXIL) 500 MG capsule Take 1 capsule (500 mg total) by mouth 3 (three) times daily. 90 capsule 5  . atorvastatin (LIPITOR) 20 MG tablet TAKE 1/2 TABLET BY MOUTH DAILY AT 6 PM 45 tablet 0  . busPIRone (BUSPAR) 7.5 MG tablet 1-2 TAB BY MOUTH 3 TIMES A DAY FOR ANXIETY 540 tablet 1  . cetirizine (ZYRTEC) 10 MG tablet Take 10 mg by mouth daily. In the morning    . diazepam (VALIUM) 10 MG tablet TAKE 1 TABLET(10 MG) BY MOUTH AT BEDTIME AS NEEDED 30 tablet 1  . famotidine (PEPCID) 40 MG tablet TAKE 1 TABLET BY MOUTH EVERYDAY AT BEDTIME 90 tablet 1  . ferrous fumarate (FERRETTS) 325 (106 Fe) MG TABS tablet Take 1 tablet (106 mg of iron total) by mouth 2 (two) times daily. 60 tablet 2  . glucose blood (CONTOUR NEXT TEST) test strip Check blood sugar once daily 100 each 12  . HYDROcodone-acetaminophen (NORCO) 10-325 MG tablet Take 1 tablet by mouth every 6 (six) hours as needed. 30 tablet 0  . ibuprofen (ADVIL) 600 MG tablet Take 1 tablet (600 mg total) by mouth every 8 (eight) hours as needed. 90 tablet 0  . lisinopril (  ZESTRIL) 5 MG tablet TAKE 1 TABLET BY MOUTH TWICE A DAY 180 tablet 1  . meclizine (ANTIVERT) 12.5 MG tablet Take 1 tablet (12.5 mg total) by mouth 3 (three) times daily as needed for dizziness. 30 tablet 0  . metFORMIN (GLUCOPHAGE) 1000 MG tablet Take 1 tablet (1,000 mg total) by mouth 2  (two) times daily with a meal. 60 tablet 0  . pregabalin (LYRICA) 75 MG capsule Take 1 capsule (75 mg total) by mouth 2 (two) times daily. 60 capsule 3  . sitaGLIPtin (JANUVIA) 50 MG tablet Take 1 tablet (50 mg total) by mouth daily. 30 tablet 3  . tamsulosin (FLOMAX) 0.4 MG CAPS capsule Take 2 capsules (0.8 mg total) by mouth daily. 180 capsule 0  . testosterone cypionate (DEPOTESTOSTERONE CYPIONATE) 200 MG/ML injection Inject 1 mL (200 mg total) into the muscle every 14 (fourteen) days. 2 mL 5  . tiZANidine (ZANAFLEX) 4 MG tablet TAKE 1.5 TABLETS (6 MG TOTAL) BY MOUTH EVERY 6 (SIX) HOURS AS NEEDED FOR MUSCLE SPASMS. 90 tablet 1  . venlafaxine XR (EFFEXOR-XR) 150 MG 24 hr capsule Take 1 capsule (150 mg total) by mouth daily with breakfast. 90 capsule 0   Current Facility-Administered Medications on File Prior to Visit  Medication Dose Route Frequency Provider Last Rate Last Admin  . cefTRIAXone (ROCEPHIN) 2 g in dextrose 5 % 50 mL IVPB  2 g Intravenous Q24H Powers, Evern Core, MD      . testosterone cypionate (DEPOTESTOSTERONE CYPIONATE) injection 200 mg  200 mg Intramuscular Q14 Days Shahin Knierim, Percell Miller, PA-C   200 mg at 07/16/19 [id number]  . testosterone cypionate (DEPOTESTOSTERONE CYPIONATE) injection 200 mg  200 mg Intramuscular Q14 Days Kayleeann Huxford, Percell Miller, PA-C   200 mg at 11/21/19 0941    BP (!) 98/52 (BP Location: Left Arm, Patient Position: Sitting, Cuff Size: Large)   Pulse 77   Temp (!) 97 F (36.1 C) (Temporal)   Resp 18   Ht 5\' 10"  (1.778 m)   Wt 174 lb 9.6 oz (79.2 kg)   SpO2 98%   BMI 25.05 kg/m       Objective:   Physical Exam  General Mental Status- Alert. General Appearance- Not in acute distress.   Skin General: Color- Normal Color. Moisture- Normal Moisture.  Neck Carotid Arteries- Normal color. Moisture- Normal Moisture. No carotid bruits. No JVD.  Chest and Lung Exam Auscultation: Breath Sounds:-Normal.  Cardiovascular Auscultation:Rythm- Regular. Murmurs & Other  Heart Sounds:Auscultation of the heart reveals- No Murmurs.  Abdomen Inspection:-Inspeection Normal. Palpation/Percussion:Note:No mass. Palpation and Percussion of the abdomen reveal- Non Tender, Non Distended + BS, no rebound or guarding.    Neurologic Cranial Nerve exam:- CN III-XII intact(No nystagmus), symmetric smile. Drift Test:- No drift. Romberg Exam:- Negative.  Heal to Toe Gait exam:-Normal. Finger to Nose:- Normal/Intact Strength:- 5/5 equal and symmetric strength both upper and lower extremities.      Assessment & Plan:  For your recent syncope check bp seated and standing couple of time later today. Stay off of lisinopril. Hydrate with propel fitness water. Use walker during day.  For diabetes continue current meds but also check blood sugar 2 additional time during day to see if you have low sugar less than 70 or high above 300.  ekg shows nsr.  For syncope will get staff to get ct of head without contrast stat.  Consider use of midrodine low dose but but is actually pretty good when I checked.  For hx of osteomyelitis will get sed,  c reactive protein and lactic acid stat.  Low sodium. Propel may help.   Continue to avoid alcohol.  Follow up date to be determined after lab review.  Mackie Pai, PA-C   Time spent with patient today was   minutes which consisted of chart revdiew, discussing diagnosis, work up treatment and documentation.  Wife will check his bp during day to see if bp continue to be low. When I checked bp was good. Also she will check sugars during the day as well.  Time spent with patient today was 40  minutes which consisted of chart review, discussing diagnoses, work up, treatment and documentation.

## 2019-12-17 NOTE — Patient Instructions (Addendum)
For your recent syncope check bp seated and standing couple of time later today. Stay off of lisinopril. Hydrate with propel fitness water. Use walker during day.  For diabetes continue current meds but also check blood sugar 2 additional time during day to see if you have low sugar less than 70 or high above 300.  ekg shows nsr.  For syncope will get staff to get ct of head without contrast stat.  Consider use of midrodine low dose but but is actually pretty good when I checked.  For hx of osteomyelitis will get sed, c reactive protein and lactic acid stat.  Low sodium. Propel may help.   Continue to avoid alcohol.  Follow up date to be determined after lab review.

## 2019-12-18 LAB — LACTIC ACID, PLASMA: LACTIC ACID: 0.5 mmol/L (ref 0.4–1.8)

## 2019-12-19 ENCOUNTER — Other Ambulatory Visit: Payer: Self-pay

## 2019-12-19 ENCOUNTER — Other Ambulatory Visit: Payer: Self-pay | Admitting: Medical

## 2019-12-19 ENCOUNTER — Encounter: Payer: Self-pay | Admitting: Specialist

## 2019-12-19 ENCOUNTER — Other Ambulatory Visit: Payer: Self-pay | Admitting: Radiology

## 2019-12-19 ENCOUNTER — Ambulatory Visit: Payer: Medicare Other | Admitting: Specialist

## 2019-12-20 ENCOUNTER — Encounter: Payer: Self-pay | Admitting: Medical

## 2019-12-20 ENCOUNTER — Ambulatory Visit (INDEPENDENT_AMBULATORY_CARE_PROVIDER_SITE_OTHER): Payer: Medicare Other

## 2019-12-20 ENCOUNTER — Other Ambulatory Visit: Payer: Self-pay

## 2019-12-20 DIAGNOSIS — R7989 Other specified abnormal findings of blood chemistry: Secondary | ICD-10-CM | POA: Diagnosis not present

## 2019-12-20 DIAGNOSIS — E349 Endocrine disorder, unspecified: Secondary | ICD-10-CM

## 2019-12-20 MED ORDER — HYDROCODONE-ACETAMINOPHEN 10-325 MG PO TABS: 1.0000 | ORAL_TABLET | Freq: Four times a day (QID) | ORAL | 0 refills | Status: DC | PRN

## 2019-12-20 MED ORDER — TIZANIDINE HCL 4 MG PO TABS: 6.0000 mg | ORAL_TABLET | Freq: Four times a day (QID) | ORAL | 1 refills | Status: DC | PRN

## 2019-12-20 NOTE — Progress Notes (Addendum)
Patient here today for testosterone injection per Mackie Pai. 30mL given in right upper outter quadrant. Patient tolerated well.   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  Mackie Pai, PA-C

## 2019-12-20 NOTE — Telephone Encounter (Signed)
Please advise, thank you.

## 2019-12-23 ENCOUNTER — Other Ambulatory Visit: Payer: Self-pay | Admitting: Specialist

## 2019-12-23 ENCOUNTER — Encounter: Payer: Self-pay | Admitting: Specialist

## 2019-12-25 ENCOUNTER — Encounter: Payer: Self-pay | Admitting: Medical

## 2019-12-25 ENCOUNTER — Encounter (HOSPITAL_BASED_OUTPATIENT_CLINIC_OR_DEPARTMENT_OTHER): Payer: Self-pay

## 2019-12-25 ENCOUNTER — Other Ambulatory Visit: Payer: Self-pay

## 2019-12-25 ENCOUNTER — Emergency Department (HOSPITAL_BASED_OUTPATIENT_CLINIC_OR_DEPARTMENT_OTHER)
Admission: EM | Admit: 2019-12-25 | Discharge: 2019-12-25 | Disposition: A | Discharge status: Home / Self Care | Payer: Medicare Other | Attending: Emergency Medicine | Admitting: Emergency Medicine

## 2019-12-25 ENCOUNTER — Emergency Department (HOSPITAL_BASED_OUTPATIENT_CLINIC_OR_DEPARTMENT_OTHER): Payer: Medicare Other

## 2019-12-25 DIAGNOSIS — E119 Type 2 diabetes mellitus without complications: Secondary | ICD-10-CM | POA: Insufficient documentation

## 2019-12-25 DIAGNOSIS — E871 Hypo-osmolality and hyponatremia: Secondary | ICD-10-CM | POA: Diagnosis not present

## 2019-12-25 DIAGNOSIS — Z7984 Long term (current) use of oral hypoglycemic drugs: Secondary | ICD-10-CM | POA: Insufficient documentation

## 2019-12-25 DIAGNOSIS — Z79899 Other long term (current) drug therapy: Secondary | ICD-10-CM | POA: Insufficient documentation

## 2019-12-25 DIAGNOSIS — I10 Essential (primary) hypertension: Secondary | ICD-10-CM | POA: Insufficient documentation

## 2019-12-25 DIAGNOSIS — R112 Nausea with vomiting, unspecified: Secondary | ICD-10-CM | POA: Diagnosis not present

## 2019-12-25 DIAGNOSIS — R5383 Other fatigue: Secondary | ICD-10-CM | POA: Diagnosis not present

## 2019-12-25 DIAGNOSIS — R911 Solitary pulmonary nodule: Secondary | ICD-10-CM | POA: Insufficient documentation

## 2019-12-25 DIAGNOSIS — R634 Abnormal weight loss: Secondary | ICD-10-CM | POA: Diagnosis present

## 2019-12-25 DIAGNOSIS — J45909 Unspecified asthma, uncomplicated: Secondary | ICD-10-CM | POA: Diagnosis not present

## 2019-12-25 DIAGNOSIS — Z87891 Personal history of nicotine dependence: Secondary | ICD-10-CM | POA: Insufficient documentation

## 2019-12-25 DIAGNOSIS — R110 Nausea: Secondary | ICD-10-CM | POA: Diagnosis not present

## 2019-12-25 LAB — COMPREHENSIVE METABOLIC PANEL
ALT: 9 U/L (ref 0–44)
AST: 12 U/L — ABNORMAL LOW (ref 15–41)
Albumin: 3.8 g/dL (ref 3.5–5.0)
Alkaline Phosphatase: 68 U/L (ref 38–126)
Anion gap: 9 (ref 5–15)
BUN: 14 mg/dL (ref 8–23)
CO2: 26 mmol/L (ref 22–32)
Calcium: 8.6 mg/dL — ABNORMAL LOW (ref 8.9–10.3)
Chloride: 92 mmol/L — ABNORMAL LOW (ref 98–111)
Creatinine, Ser: 0.79 mg/dL (ref 0.61–1.24)
GFR calc Af Amer: >60 mL/min (ref >60)
GFR calc non Af Amer: >60 mL/min (ref >60)
Glucose, Bld: 167 mg/dL — ABNORMAL HIGH (ref 70–99)
Potassium: 4 mmol/L (ref 3.5–5.1)
Sodium: 127 mmol/L — ABNORMAL LOW (ref 135–145)
Total Bilirubin: 1 mg/dL (ref 0.3–1.2)
Total Protein: 6.6 g/dL (ref 6.5–8.1)

## 2019-12-25 LAB — URINALYSIS, ROUTINE W REFLEX MICROSCOPIC
Glucose, UA: NEGATIVE mg/dL
Hgb urine dipstick: NEGATIVE
Ketones, ur: 15 mg/dL — AB
Leukocytes,Ua: NEGATIVE
Nitrite: NEGATIVE
Protein, ur: NEGATIVE mg/dL
Specific Gravity, Urine: 1.025 (ref 1.005–1.030)
pH: 6 (ref 5.0–8.0)

## 2019-12-25 LAB — CBC WITH DIFFERENTIAL/PLATELET
Abs Immature Granulocytes: 0.02 10*3/uL (ref 0.00–0.07)
Basophils Absolute: 0 10*3/uL (ref 0.0–0.1)
Basophils Relative: 0 %
Eosinophils Absolute: 0.1 10*3/uL (ref 0.0–0.5)
Eosinophils Relative: 2 %
HCT: 35.8 % — ABNORMAL LOW (ref 39.0–52.0)
Hemoglobin: 12 g/dL — ABNORMAL LOW (ref 13.0–17.0)
Immature Granulocytes: 0 %
Lymphocytes Relative: 11 %
Lymphs Abs: 0.8 10*3/uL (ref 0.7–4.0)
MCH: 30 pg (ref 26.0–34.0)
MCHC: 33.5 g/dL (ref 30.0–36.0)
MCV: 89.5 fL (ref 80.0–100.0)
Monocytes Absolute: 0.9 10*3/uL (ref 0.1–1.0)
Monocytes Relative: 13 %
Neutro Abs: 5.3 10*3/uL (ref 1.7–7.7)
Neutrophils Relative %: 74 %
Platelets: 275 10*3/uL (ref 150–400)
RBC: 4 MIL/uL — ABNORMAL LOW (ref 4.22–5.81)
RDW: 15.8 % — ABNORMAL HIGH (ref 11.5–15.5)
WBC: 7.1 10*3/uL (ref 4.0–10.5)
nRBC: 0 % (ref 0.0–0.2)

## 2019-12-25 LAB — LIPASE, BLOOD: Lipase: 16 U/L (ref 11–51)

## 2019-12-25 MED ORDER — SODIUM CHLORIDE 0.9 % IV BOLUS
1000.0000 mL | Freq: Once | INTRAVENOUS | Status: AC
  Administered 2019-12-25: 1000 mL via INTRAVENOUS

## 2019-12-25 MED ORDER — IOHEXOL 350 MG/ML SOLN
100.0000 mL | Freq: Once | INTRAVENOUS | Status: AC | PRN
  Administered 2019-12-25: 100 mL via INTRAVENOUS

## 2019-12-25 MED ORDER — ONDANSETRON 4 MG PO TBDP
4.0000 mg | ORAL_TABLET | Freq: Three times a day (TID) | ORAL | 0 refills | Status: DC | PRN
Start: 2019-12-25 — End: 2020-01-10

## 2019-12-25 NOTE — Telephone Encounter (Signed)
Can you help me with this please  

## 2019-12-25 NOTE — ED Triage Notes (Signed)
Pt c/o weight loss, nausea, weakness x "months"-states he is seeing PCP-c/o unsteady gait and "just don't feel right" x 3-4 days-NAD-steady gait into triage

## 2019-12-25 NOTE — ED Notes (Signed)
ED Provider at bedside. 

## 2019-12-25 NOTE — Telephone Encounter (Signed)
Spoke with patient regarding symptoms.  Patient reports weight loss of 20-30 pounds in the past 3 months, decreasing appetite with N/V, fatigue and sleepiness worsening over the past few days.  He does have adequate intake of G2 (zero sugar gatorade). Patient is concerned that something in labs/medications are being missed.  PCP out of office, offered appointment with different provider, declined. Patient scheduled for F/U with PCP on Monday, 12/30/2019.  Strongly encouraged if symptoms get worse, to go to ER or UC. Patient verbalized understanding.   Patient also contacted ID provider.

## 2019-12-25 NOTE — ED Notes (Signed)
Pt ambulated to bathroom to without difficulty.

## 2019-12-25 NOTE — ED Provider Notes (Signed)
[facility] EMERGENCY DEPARTMENT Provider Note   CSN: [id number] Arrival date & time: 12/25/19  1549     History Chief Complaint  Patient presents with  . Weight Loss    Luis Brown is a 74 y.o. male.  HPI     Aug had back surgery, developed epidural abscess/infection, went back to surgery  Lost 30lb over the last 3 months or less Has been vomiting Over the last week has been progressing, feeling dizzy, off balance, in bed a lot over the last month  Haven't felt well since surgery, still on pain medications, ibuprofen  Now worried as continuing to feel sick and lose weight.  Not like the flu but feels like body is not up to doing anything, like it is failing  Loss of strength and stamina  Has been unable to see Dr. Randel Pigg but has been seeing PA but not sure what is going on.    After PICC line abx, had been taking oral abx since then per Infectious disease   Past Medical History:  Diagnosis Date  . Acute pharyngitis 10/21/2013  . Allergy    grass and pollen  . Anxiety and depression 10/25/2011  . BPH (benign prostatic hyperplasia) 04/23/2012  . Chicken pox as a child  . DDD (degenerative disc disease)    cervical responds to steroid injections and low back required surgery  . DDD (degenerative disc disease), lumbosacral   . Diabetes mellitus    pre  . Dyspnea   . ED (erectile dysfunction) 04/23/2012  . Elevated BP   . 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 Esophageal reflux 02/10/2015  . Fatigue   . HTN (hypertension)   . Hyperglycemia    preDM   . Hyperlipidemia   . Insomnia   . Low back pain radiating to both legs 01/16/2017  . Measles as a child  . Medicare annual wellness visit, subsequent 02/10/2015  . Overweight(278.02)   . Peripheral neuropathy 08/07/2019  . Personal history of colonic polyps 10/27/2012   Follows with [facility]  . Pneumonia    " walking"  . Preventative health care   . 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  . Wears  glasses     Patient Active Problem List   Diagnosis Date Noted  . 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 Medication monitoring encounter 08/26/2019  . Peripheral neuropathy 08/07/2019  . Alcohol abuse 05/09/2019  . Hyponatremia 05/05/2019  . 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 Fusion of spine of thoracolumbar region 03/12/2019  . Status post thoracic spinal fusion 02/12/2019  . 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 Thoracic spinal stenosis 04/17/2017  . Painful orthopaedic hardware ([place]) 04/17/2017  . 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 ([place]) 04/17/2017  . Low back pain radiating to both legs 01/16/2017  . Esophageal reflux 02/10/2015  . Medicare annual wellness visit, subsequent 02/10/2015  . Personal history of colonic polyps 10/27/2012  . 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  . BPH (benign prostatic hyperplasia) 04/23/2012  . ED (erectile dysfunction) 04/23/2012  . Allergic state 10/25/2011  . Anxiety and depression 10/25/2011  . Asthma   . Diabetes mellitus without complication ([place])   . Hyperlipidemia   . Overweight   . Insomnia   . Fatigue   . Preventative health care   . DDD (degenerative disc disease), lumbosacral   . Thoracic degenerative disc disease   . HTN (hypertension)     Past Surgical History:  Procedure Laterality Date  . BACK SURGERY  2012 and 1994   Dr Margret Chance, screws and cage in low back  . EYE SURGERY Bilateral    2016  . HARDWARE REVISION  03/12/2019   REVISION OF HARDWARE THORACIC TEN-THORACIC ELEVEN WITH APPLICATION OF ADDITIONAL RODS AND ROD SLEEVES THORACIC EIGHT-THORACIC TWELVE 12-LUMBAR ONE (N/A )  . LAMINECTOMY  02/12/2019   DECOMPRESSIVE LAMINECTOMY THORACIC NINE-THORACIC TEN AND THORACIC TEN-THORACIC ELEVEN, EXTENSION OF THORACOLUMBAR FUSION FROM THORACIC TEN TO THORACIC FIVE, LOCAL BONE GRAFT, ALLOGRAFT AND VIVIGEN (N/A)  . SPINAL  FUSION N/A 02/12/2019   Procedure: DECOMPRESSIVE LAMINECTOMY THORACIC NINE-THORACIC TEN AND THORACIC TEN-THORACIC ELEVEN, EXTENSION OF THORACOLUMBAR FUSION FROM THORACIC TEN TO THORACIC FIVE, LOCAL BONE GRAFT, ALLOGRAFT AND VIVIGEN;  Surgeon: Jessy Oto, MD;  Location: [facility];  Service: Orthopedics;  Laterality: N/A;  DECOMPRESSIVE LAMINECTOMY THORACIC NINE-THORACIC TEN AND THORACIC TEN-THORACIC ELEVEN, EXTENSION OF TH  . SPINAL FUSION N/A 03/12/2019   Procedure: REVISION OF HARDWARE THORACIC TEN-THORACIC ELEVEN WITH APPLICATION OF ADDITIONAL RODS AND ROD SLEEVES THORACIC EIGHT-THORACIC TWELVE 12-LUMBAR ONE;  Surgeon: Jessy Oto, MD;  Location: [facility];  Service: Orthopedics;  Laterality: N/A;  . TONSILLECTOMY    . TONSILLECTOMY     as child  . torn rotator cuff  2010   right       Family History  Problem Relation Age of Onset  . Hypertension Mother   . Diabetes Mother        type 2  . Cancer Mother 1       breast in remission  . Emphysema Father        smoker  . COPD Father        smoker  . Stroke Father 24       mini  . Heart disease Father   . Hypertension Sister   . Hyperlipidemia Sister   . Scoliosis Sister   . Osteoporosis Sister   . Hypertension Maternal Grandmother   . Scoliosis Maternal Grandmother   . Heart disease Maternal Grandfather   . Heart disease Paternal Grandfather        smoker  . Heart disease Daughter     Social History   Tobacco Use  . Smoking status: Former Smoker    Packs/day: 1.00    Years: 20.00    Pack years: 20.00    Types: Cigarettes    Quit date: 07/18/1989    Years since quitting: 30.4  . Smokeless tobacco: Never Used  . Tobacco comment: off and on smoking for the 20 yrs  Vaping Use  . Vaping Use: Never used  Substance Use Topics  . Alcohol use: Yes    Comment: drink or two a day- mixed drink  . Drug use: No    Home Medications Prior to Admission medications   Medication Sig Start Date End Date Taking? Authorizing Provider   atorvastatin (LIPITOR) 20 MG tablet TAKE 1/2 TABLET BY MOUTH DAILY AT 6 PM 05/02/19   Saguier, Percell Miller, PA-C  busPIRone (BUSPAR) 7.5 MG tablet 1-2 TAB BY MOUTH 3 TIMES A DAY FOR ANXIETY 12/17/19   Saguier, Percell Miller, PA-C  cetirizine (ZYRTEC) 10 MG tablet Take 10 mg by mouth daily. In the morning    [provider]  diazepam (VALIUM) 10 MG tablet TAKE 1 TABLET(10 MG) BY MOUTH AT BEDTIME AS NEEDED 10/28/19   Saguier, Percell Miller, PA-C  famotidine (PEPCID) 40 MG tablet TAKE 1 TABLET BY MOUTH EVERYDAY AT BEDTIME 09/07/19   Saguier, Percell Miller, PA-C  ferrous fumarate (FERRETTS) 325 (106 Fe) MG TABS tablet Take 1 tablet (106 mg of iron total) by mouth 2 (two) times daily. 11/25/19   Saguier, Percell Miller, PA-C  glucose blood (CONTOUR NEXT TEST) test strip Check blood sugar once daily 02/25/19   Saguier, Percell Miller, PA-C  HYDROcodone-acetaminophen [facility]) 10-325 MG tablet Take 1 tablet by mouth every 6 (six) hours as needed. 12/20/19   Jessy Oto, MD  ibuprofen (ADVIL) 600 MG tablet TAKE 1 TABLET (600 MG TOTAL) BY MOUTH EVERY 8 (EIGHT) HOURS AS NEEDED. 12/23/19   Jessy Oto, MD  lisinopril (ZESTRIL) 5 MG tablet TAKE 1 TABLET BY MOUTH TWICE A DAY 11/21/19   Saguier, Percell Miller, PA-C  metFORMIN (GLUCOPHAGE) 1000 MG tablet TAKE 1 TABLET (1,000 MG TOTAL) BY MOUTH 2 (TWO) TIMES DAILY WITH A MEAL. 12/19/19   Saguier, Percell Miller, PA-C  ondansetron (ZOFRAN ODT) 4 MG disintegrating tablet Take 1 tablet (4 mg total) by mouth every 8 (eight) hours as needed for nausea or vomiting. 12/25/19   Gareth Morgan, MD  pregabalin (LYRICA) 75 MG capsule Take 1 capsule (75 mg total) by mouth 2 (two) times daily. 11/14/19   Jessy Oto, MD  sitaGLIPtin (JANUVIA) 50 MG tablet Take 1 tablet (50 mg total) by mouth daily. 11/25/19   Saguier, Percell Miller, PA-C  tamsulosin (FLOMAX) 0.4 MG CAPS capsule Take 2 capsules (0.8 mg total) by mouth daily. 11/18/19   Saguier, Percell Miller, PA-C  testosterone cypionate (DEPOTESTOSTERONE CYPIONATE) 200 MG/ML injection Inject 1 mL  (200 mg total) into the muscle every 14 (fourteen) days. 11/17/19   Saguier, Percell Miller, PA-C  tiZANidine (ZANAFLEX) 4 MG tablet Take 1.5 tablets (6 mg total) by mouth every 6 (six) hours as needed for muscle spasms. 12/20/19   Jessy Oto, MD  venlafaxine XR (EFFEXOR-XR) 150 MG 24 hr capsule Take 1 capsule (150 mg total) by mouth daily with breakfast. 11/06/19   Saguier, Percell Miller, PA-C    Allergies    Patient has no known allergies.  Review of Systems   Review of Systems  Constitutional: Positive for appetite change, fatigue and unexpected weight change. Negative for fever.  HENT: Negative for sore throat.   Eyes: Negative for visual disturbance.  Respiratory: Negative for cough and shortness of breath.   Cardiovascular: Positive for chest pain (gets it from back pain at times, will hurt with breathing or coughing).  Gastrointestinal: Positive for diarrhea (now loose stool, on abx), nausea and vomiting. Negative for abdominal pain (not now) and constipation.  Genitourinary: Negative for difficulty urinating and dysuria.  Musculoskeletal: Positive for back pain. Negative for neck stiffness.  Skin: Negative for rash.  Neurological: Positive for numbness (neuropathy ). Negative for syncope, facial asymmetry, speech difficulty, weakness and headaches.    Physical Exam Updated Vital Signs BP 124/62 (BP Location: Right Arm)   Pulse 80   Temp 98 F (36.7 C) (Oral)   Resp 14   Ht 5\' 10"  (1.778 m)   Wt 76.2 kg   SpO2 100%   BMI 24.11 kg/m   Physical Exam Vitals and nursing note reviewed.  Constitutional:      General: He is not in acute distress.    Appearance: He is well-developed. He is not diaphoretic.  HENT:     Head: Normocephalic and atraumatic.  Eyes:     Conjunctiva/sclera: Conjunctivae normal.  Cardiovascular:     Rate and Rhythm: Normal rate and regular rhythm.     Heart sounds: Normal heart sounds. No murmur. No friction rub.  No gallop.   Pulmonary:     Effort: Pulmonary  effort is normal. No respiratory distress.     Breath sounds: Normal breath sounds. No wheezing or rales.  Abdominal:     General: There is no distension.     Palpations: Abdomen is soft.     Tenderness: There is no abdominal tenderness. There is no guarding.  Musculoskeletal:     Cervical back: Normal range of motion.  Skin:    General: Skin is warm and dry.  Neurological:     Mental Status: He is alert and oriented to person, place, and time.     ED Results / Procedures / Treatments   Labs (all labs ordered are listed, but only abnormal results are displayed) Labs Reviewed  CBC WITH DIFFERENTIAL/PLATELET - Abnormal; Notable for the following components:      Result Value   RBC 4.00 (*)    Hemoglobin 12.0 (*)    HCT 35.8 (*)    RDW 15.8 (*)    All other components within normal limits  COMPREHENSIVE METABOLIC PANEL - Abnormal; Notable for the following components:   Sodium 127 (*)    Chloride 92 (*)    Glucose, Bld 167 (*)    Calcium 8.6 (*)    AST 12 (*)    All other components within normal limits  URINALYSIS, ROUTINE W REFLEX MICROSCOPIC - Abnormal; Notable for the following components:   APPearance CLOUDY (*)    Bilirubin Urine SMALL (*)    Ketones, ur 15 (*)    All other components within normal limits  CULTURE, BLOOD (ROUTINE X 2)  CULTURE, BLOOD (ROUTINE X 2)  URINE CULTURE  LIPASE, BLOOD    EKG None  Radiology CT Angio Chest PE W and/or Wo Contrast  Result Date: 12/25/2019 CLINICAL DATA:  Nausea, unsteady gait.  Weakness EXAM: CT ANGIOGRAPHY CHEST WITH CONTRAST TECHNIQUE: Multidetector CT imaging of the chest was performed using the standard protocol during bolus administration of intravenous contrast. Multiplanar CT image reconstructions and MIPs were obtained to evaluate the vascular anatomy. CONTRAST:  169mL OMNIPAQUE IOHEXOL 350 MG/ML SOLN COMPARISON:  None. FINDINGS: Cardiovascular: No filling defects within the pulmonary arteries to suggest acute  pulmonary embolism. Mediastinum/Nodes: No axillary supraclavicular adenopathy. No mediastinal hilar adenopathy. Esophagus normal. No pericardial fluid. Lungs/Pleura: No pulmonary infarction. No pneumonia or pneumothorax. In the a peripheral aspect of the LEFT lower lobe 5 mm nodule (image 72/5). Elongated nodule in the RIGHT lower lobe measures 7 mm (image 61/5). Upper Abdomen: Limited view of the liver, kidneys, pancreas are unremarkable. Normal adrenal glands. Musculoskeletal: Thickened paraspinal tissue of the upper thoracic spine related to prior discitis and surgical intervention. Posterior fusion of the thoracolumbar spine. The most superior pedicle screw on the RIGHT is fractured. Fracture present on exam from 07/26/2019. no acute findings Review of the MIP images confirms the above findings. IMPRESSION: 1. No evidence acute pulmonary embolism. 2. Lower lobe pulmonary nodules. Non-contrast chest CT at 6-12 months is recommended. If the nodule is stable at time of repeat CT, then future CT at 18-24 months (from today's scan) is considered optional for low-risk patients, but is recommended for high-risk patients. This recommendation follows the consensus statement: Guidelines for Management of Incidental Pulmonary Nodules Detected on CT Images: From the Fleischner Society 2017; Radiology 2017; 284:228-243. 3. Posterior lumbar fusion with sequelae of discitis osteomyelitis as well as paraspinal soft tissue thickening not appreciably changed from comparison CT and MRIs. 4. Fracture of the most superior  RIGHT pedicle screw present on 07/26/2019. Electronically Signed   By: Suzy Bouchard M.D.   On: 12/25/2019 20:58    Procedures Procedures (including critical care time)  Medications Ordered in ED Medications  sodium chloride 0.9 % bolus 1,000 mL ( Intravenous Stopped 12/25/19 2041)  iohexol (OMNIPAQUE) 350 MG/ML injection 100 mL (100 mLs Intravenous Contrast Given 12/25/19 1928)    ED Course  I have  reviewed the triage vital signs and the nursing notes.  Pertinent labs & imaging results that were available during my care of the patient were reviewed by me and considered in my medical decision making (see chart for details).    MDM Rules/Calculators/A&P                      74yo male with history of thoracolumbar fusion complicated by discitis and osteomyelitis as well as probable epidural abscess for which he has been followed by ID and orthopedic surgery with history of PICC ab now on oral amoxicillin (changed from doxycycline) who presents with worsening fatigue, generalized weakness and weight loss.    Also reports some dyspnea, pleuritic pain. CT PE study ordered showing no sign of PE, known discitis/osteomyelitis, pulmonary nodules.  No signs of acute infection.  Labs significant for worsening hyponatremia, has slowly been trending down over the last month, most recently 129 6/1 at PCP, 127 today.  Suspect hypovolemia and related to decreased po intake.  Discussed this may be contributing to symptoms although given history it is difficult to say if symptoms primarily from hyponatremia and given slow decline do not feel admission is necessary and pt agrees to outpatient follow up with his physician regarding further evaluation for etiologies and recheck. Given IV fluids in the ED.  Discussed he will need continued outpatient follow up regarding his chronic fatigue, nausea, weight loss and hyponatremia.  Given rx for zofran. Patient discharged in stable condition with understanding of reasons to return.   Final Clinical Impression(s) / ED Diagnoses Final diagnoses:  Hyponatremia  Other fatigue  Non-intractable vomiting with nausea, unspecified vomiting type  Weight loss  Pulmonary nodule    Rx / DC Orders ED Discharge Orders         Ordered    ondansetron (ZOFRAN ODT) 4 MG disintegrating tablet  Every 8 hours PRN     Discontinue  Reprint     12/25/19 2135             Gareth Morgan, MD 12/26/19 1019

## 2019-12-25 NOTE — Telephone Encounter (Signed)
I would recommend an in person visit with Luis Brown please.

## 2019-12-25 NOTE — Telephone Encounter (Signed)
Wife called to report patient is not tolerating liquids or bland food, continues to experience N/V, and increased fatigue with dizziness.  Wife is concerned patient is becoming dehydrated. Patient and wife hesitant to go to [facility] ER d/t 6 hour wait last month.  Encouraged wife to bring patient to [facility] ER, wife agrees with plan.

## 2019-12-25 NOTE — ED Notes (Signed)
Pt transported to CT ?

## 2019-12-26 ENCOUNTER — Ambulatory Visit (INDEPENDENT_AMBULATORY_CARE_PROVIDER_SITE_OTHER): Payer: Medicare Other | Admitting: Family Medicine

## 2019-12-26 ENCOUNTER — Ambulatory Visit (HOSPITAL_BASED_OUTPATIENT_CLINIC_OR_DEPARTMENT_OTHER)
Admission: RE | Admit: 2019-12-26 | Discharge: 2019-12-26 | Disposition: A | Discharge status: Home / Self Care | Payer: Medicare Other | Source: Ambulatory Visit | Attending: Family Medicine | Admitting: Family Medicine

## 2019-12-26 VITALS — BP 118/60 | HR 67 | Temp 98.5°F | Resp 16 | Ht 70.0 in | Wt 170.0 lb

## 2019-12-26 DIAGNOSIS — R112 Nausea with vomiting, unspecified: Secondary | ICD-10-CM

## 2019-12-26 DIAGNOSIS — R109 Unspecified abdominal pain: Secondary | ICD-10-CM | POA: Diagnosis not present

## 2019-12-26 DIAGNOSIS — R194 Change in bowel habit: Secondary | ICD-10-CM | POA: Diagnosis not present

## 2019-12-26 DIAGNOSIS — R110 Nausea: Secondary | ICD-10-CM | POA: Diagnosis not present

## 2019-12-26 DIAGNOSIS — E349 Endocrine disorder, unspecified: Secondary | ICD-10-CM | POA: Diagnosis not present

## 2019-12-26 DIAGNOSIS — K219 Gastro-esophageal reflux disease without esophagitis: Secondary | ICD-10-CM

## 2019-12-26 DIAGNOSIS — E871 Hypo-osmolality and hyponatremia: Secondary | ICD-10-CM | POA: Insufficient documentation

## 2019-12-26 DIAGNOSIS — I10 Essential (primary) hypertension: Secondary | ICD-10-CM

## 2019-12-26 DIAGNOSIS — M4624 Osteomyelitis of vertebra, thoracic region: Secondary | ICD-10-CM

## 2019-12-26 DIAGNOSIS — R634 Abnormal weight loss: Secondary | ICD-10-CM

## 2019-12-26 DIAGNOSIS — R1013 Epigastric pain: Secondary | ICD-10-CM

## 2019-12-26 DIAGNOSIS — E782 Mixed hyperlipidemia: Secondary | ICD-10-CM

## 2019-12-26 DIAGNOSIS — R1110 Vomiting, unspecified: Secondary | ICD-10-CM | POA: Diagnosis not present

## 2019-12-26 LAB — COMPREHENSIVE METABOLIC PANEL
ALT: 8 U/L (ref 0–53)
AST: 13 U/L (ref 0–37)
Albumin: 4 g/dL (ref 3.5–5.2)
Alkaline Phosphatase: 76 U/L (ref 39–117)
BUN: 10 mg/dL (ref 6–23)
CO2: 31 mEq/L (ref 19–32)
Calcium: 9.3 mg/dL (ref 8.4–10.5)
Chloride: 96 mEq/L (ref 96–112)
Creatinine, Ser: 0.65 mg/dL (ref 0.40–1.50)
GFR: 119.33 mL/min (ref >60.00)
Glucose, Bld: 208 mg/dL — ABNORMAL HIGH (ref 70–99)
Potassium: 4 mEq/L (ref 3.5–5.1)
Sodium: 132 mEq/L — ABNORMAL LOW (ref 135–145)
Total Bilirubin: 0.7 mg/dL (ref 0.2–1.2)
Total Protein: 6.4 g/dL (ref 6.0–8.3)

## 2019-12-26 LAB — CBC WITH DIFFERENTIAL/PLATELET
Basophils Absolute: 0 10*3/uL (ref 0.0–0.1)
Basophils Relative: 0.4 % (ref 0.0–3.0)
Eosinophils Absolute: 0.1 10*3/uL (ref 0.0–0.7)
Eosinophils Relative: 1.7 % (ref 0.0–5.0)
HCT: 37.6 % — ABNORMAL LOW (ref 39.0–52.0)
Hemoglobin: 12.7 g/dL — ABNORMAL LOW (ref 13.0–17.0)
Lymphocytes Relative: 6.7 % — ABNORMAL LOW (ref 12.0–46.0)
Lymphs Abs: 0.4 10*3/uL — ABNORMAL LOW (ref 0.7–4.0)
MCHC: 33.8 g/dL (ref 30.0–36.0)
MCV: 89.3 fl (ref 78.0–100.0)
Monocytes Absolute: 0.6 10*3/uL (ref 0.1–1.0)
Monocytes Relative: 11.3 % (ref 3.0–12.0)
Neutro Abs: 4.4 10*3/uL (ref 1.4–7.7)
Neutrophils Relative %: 79.9 % — ABNORMAL HIGH (ref 43.0–77.0)
Platelets: 287 10*3/uL (ref 150.0–400.0)
RBC: 4.21 Mil/uL — ABNORMAL LOW (ref 4.22–5.81)
RDW: 17.2 % — ABNORMAL HIGH (ref 11.5–15.5)
WBC: 5.5 10*3/uL (ref 4.0–10.5)

## 2019-12-26 LAB — H. PYLORI ANTIBODY, IGG: H Pylori IgG: NEGATIVE

## 2019-12-26 LAB — AMYLASE: Amylase: 19 U/L — ABNORMAL LOW (ref 27–131)

## 2019-12-26 LAB — SEDIMENTATION RATE: Sed Rate: 16 mm/hr (ref 0–20)

## 2019-12-26 LAB — LIPASE: Lipase: 0 U/L — ABNORMAL LOW (ref 11.0–59.0)

## 2019-12-26 MED ORDER — IOHEXOL 300 MG/ML  SOLN
100.0000 mL | Freq: Once | INTRAMUSCULAR | Status: AC | PRN
  Administered 2019-12-26: 100 mL via INTRAVENOUS

## 2019-12-26 NOTE — Addendum Note (Signed)
Addended by: Kelle Darting A on: 12/26/2019 08:18 AM   Modules accepted: Orders

## 2019-12-26 NOTE — Telephone Encounter (Signed)
Wife called to report patient is not tolerating liquids or bland food, continues to experience N/V, and increased fatigue with dizziness.  Wife is concerned patient is becoming dehydrated. Patient and wife hesitant to go to [facility] ER d/t 6 hour wait last month.  Encouraged wife to bring patient to [place] ER, wife agrees with plan.    **Note was documented under incorrect mychart message (ID instead of Primary Care)**

## 2019-12-26 NOTE — Telephone Encounter (Deleted)
Wife called to report patient is not tolerating liquids or bland food, continues to experience N/V, and increased fatigue with dizziness.  Wife is concerned patient is becoming dehydrated. Patient and wife hesitant to go to [facility] ER d/t 6 hour wait last month.  Encouraged wife to bring patient to [facility] ER, wife agrees with plan.

## 2019-12-27 ENCOUNTER — Other Ambulatory Visit: Payer: Self-pay | Admitting: Specialist

## 2019-12-27 ENCOUNTER — Encounter: Payer: Self-pay | Admitting: Specialist

## 2019-12-27 ENCOUNTER — Encounter: Payer: Self-pay | Admitting: Family Medicine

## 2019-12-27 ENCOUNTER — Other Ambulatory Visit: Payer: Self-pay | Admitting: Family Medicine

## 2019-12-27 DIAGNOSIS — R1013 Epigastric pain: Secondary | ICD-10-CM

## 2019-12-27 DIAGNOSIS — K8020 Calculus of gallbladder without cholecystitis without obstruction: Secondary | ICD-10-CM

## 2019-12-27 DIAGNOSIS — R634 Abnormal weight loss: Secondary | ICD-10-CM

## 2019-12-27 LAB — URINE CULTURE: Culture: NO GROWTH

## 2019-12-27 NOTE — Telephone Encounter (Signed)
Jeneen Rinks was not comfortable refilling due to patients GI issues that he is being worked up for.

## 2019-12-27 NOTE — Telephone Encounter (Signed)
Advise patient that he is being worked up by his PCP for ongoing GI issues. See chart. Sorry but I do not feel comfortable prescribing the medication.  He should talk to PCP about this.

## 2019-12-27 NOTE — Telephone Encounter (Signed)
Can you please advise?

## 2019-12-27 NOTE — Telephone Encounter (Signed)
Nitka patient 

## 2019-12-29 LAB — TESTOSTERONE, FREE, TOTAL, SHBG
Sex Hormone Binding: 39.2 nmol/L (ref 19.3–76.4)
Testosterone, Free: 23.7 pg/mL — ABNORMAL HIGH (ref 6.6–18.1)
Testosterone: 1317 ng/dL — ABNORMAL HIGH (ref 264–916)

## 2019-12-30 ENCOUNTER — Telehealth: Payer: Self-pay | Admitting: *Deleted

## 2019-12-30 ENCOUNTER — Telehealth: Payer: Medicare Other | Admitting: Medical

## 2019-12-30 ENCOUNTER — Encounter: Payer: Self-pay | Admitting: Specialist

## 2019-12-30 ENCOUNTER — Other Ambulatory Visit: Payer: Self-pay | Admitting: Medical

## 2019-12-30 DIAGNOSIS — R194 Change in bowel habit: Secondary | ICD-10-CM | POA: Insufficient documentation

## 2019-12-30 DIAGNOSIS — R112 Nausea with vomiting, unspecified: Secondary | ICD-10-CM | POA: Insufficient documentation

## 2019-12-30 DIAGNOSIS — R634 Abnormal weight loss: Secondary | ICD-10-CM | POA: Insufficient documentation

## 2019-12-30 DIAGNOSIS — R109 Unspecified abdominal pain: Secondary | ICD-10-CM | POA: Insufficient documentation

## 2019-12-30 LAB — CULTURE, BLOOD (ROUTINE X 2)
Culture: NO GROWTH
Culture: NO GROWTH
Special Requests: ADEQUATE
Special Requests: ADEQUATE

## 2019-12-30 MED ORDER — HYDROCODONE-ACETAMINOPHEN 10-325 MG PO TABS: 1.0000 | ORAL_TABLET | Freq: Four times a day (QID) | ORAL | 0 refills | Status: DC | PRN

## 2019-12-30 NOTE — Assessment & Plan Note (Signed)
Avoid offending foods, start probiotics. Do not eat large meals in late evening and consider raising head of bed.  

## 2019-12-30 NOTE — Assessment & Plan Note (Signed)
Encouraged heart healthy diet, increase exercise, avoid trans fats, consider a krill oil cap daily 

## 2019-12-30 NOTE — Assessment & Plan Note (Signed)
With epigastric pain, nausea and vomiting. CT scan abdomen is ordered and referred to gastroenterology and maintain a bland diet

## 2019-12-30 NOTE — Progress Notes (Signed)
Subjective:    [name], male    DOB: 11/28/1945, 74 y.o.   MRN: [id number]  Chief Complaint  Patient presents with  . Follow-up    HPI Patient is in today for follow up on chronic medical concerns. No recent febrile illness or hospitalizations. He is struggling with weight loss, anorexia, nausea, vomiting, malaise, fatigue, change in bowels habits. His symptoms have persisted since his back surgery many months ago. Denies CP/palp/SOB/HA/congestion/fevers or GU c/o. Taking meds as prescribed  Past Medical History:  Diagnosis Date  . Acute pharyngitis 10/21/2013  . Allergy    grass and pollen  . Anxiety and depression 10/25/2011  . BPH (benign prostatic hyperplasia) 04/23/2012  . Chicken pox as a child  . DDD (degenerative disc disease)    cervical responds to steroid injections and low back required surgery  . DDD (degenerative disc disease), lumbosacral   . Diabetes mellitus    pre  . Dyspnea   . ED (erectile dysfunction) 04/23/2012  . Elevated BP   . 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 Esophageal reflux 02/10/2015  . Fatigue   . HTN (hypertension)   . Hyperglycemia    preDM   . Hyperlipidemia   . Insomnia   . Low back pain radiating to both legs 01/16/2017  . Measles as a child  . Medicare annual wellness visit, subsequent 02/10/2015  . Overweight(278.02)   . Peripheral neuropathy 08/07/2019  . Personal history of colonic polyps 10/27/2012   Follows with [facility]  . Pneumonia    " walking"  . Preventative health care   . 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  . Wears glasses     Past Surgical History:  Procedure Laterality Date  . BACK SURGERY  2012 and 1994   Dr Margret Chance, screws and cage in low back  . EYE SURGERY Bilateral    2016  . HARDWARE REVISION  03/12/2019   REVISION OF HARDWARE THORACIC TEN-THORACIC ELEVEN WITH APPLICATION OF ADDITIONAL RODS AND ROD SLEEVES THORACIC EIGHT-THORACIC TWELVE 12-LUMBAR ONE (N/A )  . LAMINECTOMY  02/12/2019     DECOMPRESSIVE LAMINECTOMY THORACIC NINE-THORACIC TEN AND THORACIC TEN-THORACIC ELEVEN, EXTENSION OF THORACOLUMBAR FUSION FROM THORACIC TEN TO THORACIC FIVE, LOCAL BONE GRAFT, ALLOGRAFT AND VIVIGEN (N/A)  . SPINAL FUSION N/A 02/12/2019   Procedure: DECOMPRESSIVE LAMINECTOMY THORACIC NINE-THORACIC TEN AND THORACIC TEN-THORACIC ELEVEN, EXTENSION OF THORACOLUMBAR FUSION FROM THORACIC TEN TO THORACIC FIVE, LOCAL BONE GRAFT, ALLOGRAFT AND VIVIGEN;  Surgeon: Jessy Oto, MD;  Location: [place];  Service: Orthopedics;  Laterality: N/A;  DECOMPRESSIVE LAMINECTOMY THORACIC NINE-THORACIC TEN AND THORACIC TEN-THORACIC ELEVEN, EXTENSION OF TH  . SPINAL FUSION N/A 03/12/2019   Procedure: REVISION OF HARDWARE THORACIC TEN-THORACIC ELEVEN WITH APPLICATION OF ADDITIONAL RODS AND ROD SLEEVES THORACIC EIGHT-THORACIC TWELVE 12-LUMBAR ONE;  Surgeon: Jessy Oto, MD;  Location: [place];  Service: Orthopedics;  Laterality: N/A;  . TONSILLECTOMY    . TONSILLECTOMY     as child  . torn rotator cuff  2010   right    Family History  Problem Relation Age of Onset  . Hypertension Mother   . Diabetes Mother        type 2  . Cancer Mother 51       breast in remission  . Emphysema Father        smoker  . COPD Father        smoker  . Stroke Father 33       mini  . Heart disease  Father   . Hypertension Sister   . Hyperlipidemia Sister   . Scoliosis Sister   . Osteoporosis Sister   . Hypertension Maternal Grandmother   . Scoliosis Maternal Grandmother   . Heart disease Maternal Grandfather   . Heart disease Paternal Grandfather        smoker  . Heart disease Daughter     Social History   Socioeconomic History  . Marital status: Married    Spouse name: Not on file  . Number of children: Not on file  . Years of education: Not on file  . Highest education level: Not on file  Occupational History  . Not on file  Tobacco Use  . Smoking status: Former Smoker    Packs/day: 1.00    Years: 20.00    Pack  years: 20.00    Types: Cigarettes    Quit date: 07/18/1989    Years since quitting: 30.4  . Smokeless tobacco: Never Used  . Tobacco comment: off and on smoking for the 20 yrs  Vaping Use  . Vaping Use: Never used  Substance and Sexual Activity  . Alcohol use: Yes    Comment: drink or two a day- mixed drink  . Drug use: No  . Sexual activity: Yes    Comment: lives with wife, still working, no dietary restrictions, continues to exercise intermittently  Other Topics Concern  . Not on file  Social History Narrative  . Not on file   Social Determinants of Health   Financial Resource Strain: Low Risk   . Difficulty of Paying Living Expenses: Not hard at all  Food Insecurity:   . Worried About Charity fundraiser in the Last Year:   . Arboriculturist in the Last Year:   Transportation Needs: No Transportation Needs  . Lack of Transportation (Medical): No  . Lack of Transportation (Non-Medical): No  Physical Activity:   . Days of Exercise per Week:   . Minutes of Exercise per Session:   Stress:   . Feeling of Stress :   Social Connections:   . Frequency of Communication with Friends and Family:   . Frequency of Social Gatherings with Friends and Family:   . Attends Religious Services:   . Active Member of Clubs or Organizations:   . Attends Archivist Meetings:   Marland Kitchen Marital Status:   Intimate Partner Violence:   . Fear of Current or Ex-Partner:   . Emotionally Abused:   Marland Kitchen Physically Abused:   . Sexually Abused:     Outpatient Medications Prior to Visit  Medication Sig Dispense Refill  . atorvastatin (LIPITOR) 20 MG tablet TAKE 1/2 TABLET BY MOUTH DAILY AT 6 PM 45 tablet 0  . busPIRone (BUSPAR) 7.5 MG tablet 1-2 TAB BY MOUTH 3 TIMES A DAY FOR ANXIETY 540 tablet 1  . cetirizine (ZYRTEC) 10 MG tablet Take 10 mg by mouth daily. In the morning    . diazepam (VALIUM) 10 MG tablet TAKE 1 TABLET(10 MG) BY MOUTH AT BEDTIME AS NEEDED 30 tablet 1  . famotidine (PEPCID) 40  MG tablet TAKE 1 TABLET BY MOUTH EVERYDAY AT BEDTIME 90 tablet 1  . glucose blood (CONTOUR NEXT TEST) test strip Check blood sugar once daily 100 each 12  . ibuprofen (ADVIL) 600 MG tablet TAKE 1 TABLET (600 MG TOTAL) BY MOUTH EVERY 8 (EIGHT) HOURS AS NEEDED. 90 tablet 0  . lisinopril (ZESTRIL) 5 MG tablet TAKE 1 TABLET BY MOUTH TWICE A DAY  180 tablet 1  . metFORMIN (GLUCOPHAGE) 1000 MG tablet TAKE 1 TABLET (1,000 MG TOTAL) BY MOUTH 2 (TWO) TIMES DAILY WITH A MEAL. 60 tablet 0  . ondansetron (ZOFRAN ODT) 4 MG disintegrating tablet Take 1 tablet (4 mg total) by mouth every 8 (eight) hours as needed for nausea or vomiting. 20 tablet 0  . pregabalin (LYRICA) 75 MG capsule Take 1 capsule (75 mg total) by mouth 2 (two) times daily. 60 capsule 3  . sitaGLIPtin (JANUVIA) 50 MG tablet Take 1 tablet (50 mg total) by mouth daily. 30 tablet 3  . tamsulosin (FLOMAX) 0.4 MG CAPS capsule Take 2 capsules (0.8 mg total) by mouth daily. 180 capsule 0  . testosterone cypionate (DEPOTESTOSTERONE CYPIONATE) 200 MG/ML injection Inject 1 mL (200 mg total) into the muscle every 14 (fourteen) days. 2 mL 5  . tiZANidine (ZANAFLEX) 4 MG tablet Take 1.5 tablets (6 mg total) by mouth every 6 (six) hours as needed for muscle spasms. 90 tablet 1  . venlafaxine XR (EFFEXOR-XR) 150 MG 24 hr capsule Take 1 capsule (150 mg total) by mouth daily with breakfast. 90 capsule 0  . ferrous fumarate (FERRETTS) 325 (106 Fe) MG TABS tablet Take 1 tablet (106 mg of iron total) by mouth 2 (two) times daily. 60 tablet 2  . HYDROcodone-acetaminophen (NORCO) 10-325 MG tablet Take 1 tablet by mouth every 6 (six) hours as needed. 30 tablet 0  . albuterol (VENTOLIN HFA) 108 (90 Base) MCG/ACT inhaler Inhale 2 puffs into the lungs every 6 (six) hours as needed. 18 g 0  . amoxicillin (AMOXIL) 500 MG capsule Take 1 capsule (500 mg total) by mouth 3 (three) times daily. 90 capsule 5  . meclizine (ANTIVERT) 12.5 MG tablet Take 1 tablet (12.5 mg total) by  mouth 3 (three) times daily as needed for dizziness. 30 tablet 0   Facility-Administered Medications Prior to Visit  Medication Dose Route Frequency Provider Last Rate Last Admin  . cefTRIAXone (ROCEPHIN) 2 g in dextrose 5 % 50 mL IVPB  2 g Intravenous Q24H Powers, Evern Core, MD      . testosterone cypionate (DEPOTESTOSTERONE CYPIONATE) injection 200 mg  200 mg Intramuscular Q14 Days Saguier, Percell Miller, PA-C   200 mg at 07/16/19 1610  . testosterone cypionate (DEPOTESTOSTERONE CYPIONATE) injection 200 mg  200 mg Intramuscular Q14 Days Saguier, Percell Miller, PA-C   200 mg at 12/20/19 0951    No Known Allergies  Review of Systems  Constitutional: Positive for malaise/fatigue and weight loss. Negative for fever.  HENT: Negative for congestion.   Eyes: Negative for blurred vision.  Respiratory: Negative for shortness of breath.   Cardiovascular: Negative for chest pain, palpitations and leg swelling.  Gastrointestinal: Positive for abdominal pain, nausea and vomiting. Negative for blood in stool.  Genitourinary: Negative for dysuria and frequency.  Musculoskeletal: Positive for myalgias. Negative for falls.  Skin: Negative for rash.  Neurological: Positive for weakness. Negative for dizziness, loss of consciousness and headaches.  Endo/Heme/Allergies: Negative for environmental allergies.  Psychiatric/Behavioral: Negative for depression. The patient is not nervous/anxious.        Objective:    Physical Exam Vitals and nursing note reviewed.  Constitutional:      General: He is not in acute distress.    Appearance: He is well-developed.  HENT:     Head: Normocephalic and atraumatic.     Right Ear: External ear normal.     Left Ear: External ear normal.     Nose: Nose normal.  Eyes:     General:        Right eye: No discharge.        Left eye: No discharge.  Cardiovascular:     Rate and Rhythm: Normal rate and regular rhythm.     Heart sounds: No murmur heard.   Pulmonary:     Effort:  Pulmonary effort is normal.     Breath sounds: Normal breath sounds.  Abdominal:     General: Bowel sounds are normal.     Palpations: Abdomen is soft. There is no mass.     Tenderness: There is abdominal tenderness. There is no guarding.  Musculoskeletal:     Cervical back: Normal range of motion and neck supple.  Skin:    General: Skin is warm and dry.  Neurological:     Mental Status: He is alert and oriented to person, place, and time.     BP 118/60 (BP Location: Right Arm, Cuff Size: Normal)   Pulse 67   Temp 98.5 F (36.9 C) (Temporal)   Resp 16   Ht 5\' 10"  (1.778 m)   Wt 170 lb (77.1 kg)   SpO2 97%   BMI 24.39 kg/m  Wt Readings from Last 3 Encounters:  12/26/19 170 lb (77.1 kg)  12/25/19 168 lb (76.2 kg)  12/17/19 174 lb 9.6 oz (79.2 kg)    Diabetic Foot Exam - Simple   No data filed     Lab Results  Component Value Date   WBC 5.5 12/26/2019   HGB 12.7 (L) 12/26/2019   HCT 37.6 (L) 12/26/2019   PLT 287.0 12/26/2019   GLUCOSE 208 (H) 12/26/2019   CHOL 151 08/17/2018   TRIG 78.0 08/17/2018   HDL 64.10 08/17/2018   LDLDIRECT 64.0 10/30/2017   LDLCALC 71 08/17/2018   ALT 8 12/26/2019   AST 13 12/26/2019   NA 132 (L) 12/26/2019   K 4.0 12/26/2019   CL 96 12/26/2019   CREATININE 0.65 12/26/2019   BUN 10 12/26/2019   CO2 31 12/26/2019   TSH 3.42 10/08/2019   PSA 0.65 10/08/2019   INR 1.1 03/12/2019   HGBA1C 7.0 (H) 10/08/2019   MICROALBUR 0.61 04/23/2012    Lab Results  Component Value Date   TSH 3.42 10/08/2019   Lab Results  Component Value Date   WBC 5.5 12/26/2019   HGB 12.7 (L) 12/26/2019   HCT 37.6 (L) 12/26/2019   MCV 89.3 12/26/2019   PLT 287.0 12/26/2019   Lab Results  Component Value Date   NA 132 (L) 12/26/2019   K 4.0 12/26/2019   CO2 31 12/26/2019   GLUCOSE 208 (H) 12/26/2019   BUN 10 12/26/2019   CREATININE 0.65 12/26/2019   BILITOT 0.7 12/26/2019   ALKPHOS 76 12/26/2019   AST 13 12/26/2019   ALT 8 12/26/2019   PROT  6.4 12/26/2019   ALBUMIN 4.0 12/26/2019   CALCIUM 9.3 12/26/2019   ANIONGAP 9 12/25/2019   GFR 119.33 12/26/2019   Lab Results  Component Value Date   CHOL 151 08/17/2018   Lab Results  Component Value Date   HDL 64.10 08/17/2018   Lab Results  Component Value Date   LDLCALC 71 08/17/2018   Lab Results  Component Value Date   TRIG 78.0 08/17/2018   Lab Results  Component Value Date   CHOLHDL 2 08/17/2018   Lab Results  Component Value Date   HGBA1C 7.0 (H) 10/08/2019       Assessment & Plan:  Problem List Items Addressed This Visit    HTN (hypertension)    Well controlled, no changes to meds. Encouraged heart healthy diet such as the DASH diet and exercise as tolerated. Can hold meds if BP continues to run low.       Relevant Orders   Ambulatory referral to Gastroenterology   Hyperlipidemia    Encouraged heart healthy diet, increase exercise, avoid trans fats, consider a krill oil cap daily      Testosterone deficiency   Relevant Orders   Testosterone, Free, Total, SHBG (Completed)   Esophageal reflux    Avoid offending foods, start probiotics. Do not eat large meals in late evening and consider raising head of bed.       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 pylori antibody, IgG (Completed)   CT Abdomen Pelvis W Contrast (Completed)   Sedimentation rate (Completed)   Nausea and vomiting   Relevant Orders   CT Abdomen Pelvis W Contrast (Completed)   Sedimentation rate (Completed)   Weight loss, abnormal - Primary    With epigastric pain, nausea and vomiting. CT scan abdomen is ordered and referred to  gastroenterology and maintain a bland diet      Relevant Orders   CT Abdomen Pelvis W Contrast (Completed)   CBC w/Diff (Completed)   Lipase (Completed)   Amylase (Completed)   Ambulatory referral to Infectious Disease   Change in bowel habits   Relevant Orders   CT Abdomen Pelvis W Contrast (Completed)   Sedimentation rate (Completed)    Other Visit Diagnoses    Epigastric pain       Relevant Orders   H. pylori antibody, IgG (Completed)      I have discontinued Trevonn E. Bean "Ike"'s meclizine, albuterol, and amoxicillin. I am also having him maintain his cetirizine, Contour Next Test, atorvastatin, famotidine, diazepam, venlafaxine XR, pregabalin, testosterone cypionate, tamsulosin, lisinopril, sitaGLIPtin, busPIRone, metFORMIN, tiZANidine, ibuprofen, and ondansetron. We will continue to administer cefTRIAXone (ROCEPHIN) 2 g in dextrose 5 % 50 mL IVPB, testosterone cypionate, and testosterone cypionate.  No orders of the defined types were placed in this encounter.    Penni Homans, MD

## 2019-12-30 NOTE — Assessment & Plan Note (Signed)
Well controlled, no changes to meds. Encouraged heart healthy diet such as the DASH diet and exercise as tolerated. Can hold meds if BP continues to run low.

## 2019-12-30 NOTE — Telephone Encounter (Signed)
Attempted to call patient to see if he started the doxycycline and to see how he was feeling.  Left message asking him to call back.  Landis Gandy, RN  ===View-only below this line=== ----- Message ----- From: Tommy Medal, Lavell Islam, MD Sent: 12/30/2019   8:33 AM EDT To: Landis Gandy, RN Subject: RE: Non-Urgent Medical Question                The ESR and CRP are encouraging. Back pain worsening is disconcerting. He should change back to doxycycline. If need be we can get another mRI, when is his next appt? ----- Message ----- From: Landis Gandy, RN Sent: 12/27/2019   2:55 PM EDT To: Truman Hayward, MD Subject: RE: Non-Urgent Medical Question                ----- Message from Landis Gandy, RN sent at 12/27/2019  2:55 PM EDT -----    ----- Message sent from Landis Gandy, RN to Ivor Costa "Ike" at 12/27/2019  2:54 PM -----  Salley Scarlet, Mr. Letts.   Let me send this on to Dr Tommy Medal to review the labs that were drawn by your primary care and also at the hospital for signs of recurrent or worsening back infection. There are certainly a number of causes for your symptoms. I know you don't feel well - I am so glad you are in touch with all of your doctors to help identify the cause. Have you switched back to doxycycline?  Did that help any of your other symptoms? Thank you,  Sharyn Lull

## 2019-12-30 NOTE — Assessment & Plan Note (Signed)
He has been referred to ID at [facility] for a second opinion due to his persistent pain and weight loss

## 2019-12-31 ENCOUNTER — Other Ambulatory Visit: Payer: Self-pay | Admitting: Specialist

## 2019-12-31 ENCOUNTER — Encounter: Payer: Self-pay | Admitting: Family Medicine

## 2019-12-31 ENCOUNTER — Other Ambulatory Visit: Payer: Self-pay | Admitting: Infectious Disease

## 2019-12-31 MED ORDER — HYDROCODONE-ACETAMINOPHEN 10-325 MG PO TABS: 1.0000 | ORAL_TABLET | Freq: Four times a day (QID) | ORAL | 0 refills | Status: DC | PRN

## 2019-12-31 MED ORDER — DOXYCYCLINE HYCLATE 100 MG PO TABS: 100.0000 mg | ORAL_TABLET | Freq: Two times a day (BID) | ORAL | 5 refills | Status: DC

## 2019-12-31 NOTE — Telephone Encounter (Signed)
Rx prescribed. We should request pain management , Dr. Myles Rosenthal.

## 2019-12-31 NOTE — Telephone Encounter (Signed)
This patient has been taking medication for some time. I think it is a simple decision to renew. I am referring for pain management as well.

## 2019-12-31 NOTE — Telephone Encounter (Signed)
Script sent  

## 2019-12-31 NOTE — Telephone Encounter (Signed)
Pain management program is appropriate as surgery is not planned and expect he will continue with his pain meds.

## 2020-01-01 ENCOUNTER — Encounter: Payer: Self-pay | Admitting: Specialist

## 2020-01-01 ENCOUNTER — Other Ambulatory Visit: Payer: Self-pay | Admitting: Radiology

## 2020-01-01 DIAGNOSIS — T8484XA Pain due to internal orthopedic prosthetic devices, implants and grafts, initial encounter: Secondary | ICD-10-CM

## 2020-01-01 DIAGNOSIS — Z981 Arthrodesis status: Secondary | ICD-10-CM

## 2020-01-01 NOTE — Telephone Encounter (Signed)
Chastity with [facility] contacted the pt and he is scheduled for 6/21 12:15pm with Dr. Michail Sermon at [place].

## 2020-01-01 NOTE — Telephone Encounter (Signed)
Order for pain management entered

## 2020-01-01 NOTE — Telephone Encounter (Signed)
[facility] can change pt to see a PA but earliest appt is 02/04/20. Current appt is 02/20/20 with Dr. Lyndel Safe. I have a call into [facility] as pt was previously seen by Dr. Michail Sermon. He is booked until August. Juliann Pulse at [facility] is sending a msg to Dr. Kathline Magic nurse, Lenna Sciara, requesting urgent appt and having her pull the CT from 12/26/19 as a reference to try to get a work in appt.

## 2020-01-02 NOTE — Telephone Encounter (Signed)
Spoke with patient. He started doxycycline about 2 weeks ago.  He states that the fatigue, nausea, dizziness was due to gallstones, not his back pain. He will see GI on Monday 6/21. He will follow up with Dr Tommy Medal as scheduled 8/11, will call back if he needs anything. Landis Gandy, RN

## 2020-01-05 ENCOUNTER — Other Ambulatory Visit: Payer: Self-pay | Admitting: Specialist

## 2020-01-06 ENCOUNTER — Encounter: Payer: Self-pay | Admitting: Family Medicine

## 2020-01-06 ENCOUNTER — Other Ambulatory Visit: Payer: Self-pay | Admitting: Gastroenterology

## 2020-01-06 ENCOUNTER — Encounter: Payer: Self-pay | Admitting: Medical

## 2020-01-06 DIAGNOSIS — R1084 Generalized abdominal pain: Secondary | ICD-10-CM

## 2020-01-06 DIAGNOSIS — R112 Nausea with vomiting, unspecified: Secondary | ICD-10-CM | POA: Diagnosis not present

## 2020-01-06 MED ORDER — HYDROCODONE-ACETAMINOPHEN 10-325 MG PO TABS: 1.0000 | ORAL_TABLET | Freq: Four times a day (QID) | ORAL | 0 refills | Status: DC | PRN

## 2020-01-06 NOTE — Telephone Encounter (Signed)
JN pt

## 2020-01-07 ENCOUNTER — Telehealth: Payer: Self-pay | Admitting: Medical

## 2020-01-07 ENCOUNTER — Other Ambulatory Visit: Payer: Self-pay

## 2020-01-07 ENCOUNTER — Encounter (HOSPITAL_BASED_OUTPATIENT_CLINIC_OR_DEPARTMENT_OTHER): Payer: Self-pay

## 2020-01-07 ENCOUNTER — Inpatient Hospital Stay (HOSPITAL_BASED_OUTPATIENT_CLINIC_OR_DEPARTMENT_OTHER)
Admission: AD | Admit: 2020-01-07 | Discharge: 2020-01-10 | DRG: 377 | Disposition: A | Discharge status: Home Health | Payer: Medicare Other | Attending: Family Medicine | Admitting: Family Medicine

## 2020-01-07 ENCOUNTER — Encounter: Payer: Self-pay | Admitting: Family Medicine

## 2020-01-07 ENCOUNTER — Emergency Department (HOSPITAL_BASED_OUTPATIENT_CLINIC_OR_DEPARTMENT_OTHER): Payer: Medicare Other

## 2020-01-07 DIAGNOSIS — K254 Chronic or unspecified gastric ulcer with hemorrhage: Secondary | ICD-10-CM | POA: Diagnosis not present

## 2020-01-07 DIAGNOSIS — E1165 Type 2 diabetes mellitus with hyperglycemia: Secondary | ICD-10-CM

## 2020-01-07 DIAGNOSIS — I10 Essential (primary) hypertension: Secondary | ICD-10-CM | POA: Diagnosis not present

## 2020-01-07 DIAGNOSIS — K8020 Calculus of gallbladder without cholecystitis without obstruction: Secondary | ICD-10-CM | POA: Diagnosis present

## 2020-01-07 DIAGNOSIS — Z79899 Other long term (current) drug therapy: Secondary | ICD-10-CM

## 2020-01-07 DIAGNOSIS — J45909 Unspecified asthma, uncomplicated: Secondary | ICD-10-CM | POA: Diagnosis present

## 2020-01-07 DIAGNOSIS — M4624 Osteomyelitis of vertebra, thoracic region: Secondary | ICD-10-CM | POA: Diagnosis present

## 2020-01-07 DIAGNOSIS — Z981 Arthrodesis status: Secondary | ICD-10-CM

## 2020-01-07 DIAGNOSIS — R531 Weakness: Secondary | ICD-10-CM | POA: Diagnosis not present

## 2020-01-07 DIAGNOSIS — K209 Esophagitis, unspecified without bleeding: Secondary | ICD-10-CM | POA: Diagnosis not present

## 2020-01-07 DIAGNOSIS — Z8262 Family history of osteoporosis: Secondary | ICD-10-CM | POA: Diagnosis not present

## 2020-01-07 DIAGNOSIS — Z87891 Personal history of nicotine dependence: Secondary | ICD-10-CM

## 2020-01-07 DIAGNOSIS — G8929 Other chronic pain: Secondary | ICD-10-CM | POA: Diagnosis not present

## 2020-01-07 DIAGNOSIS — E871 Hypo-osmolality and hyponatremia: Secondary | ICD-10-CM

## 2020-01-07 DIAGNOSIS — K264 Chronic or unspecified duodenal ulcer with hemorrhage: Secondary | ICD-10-CM | POA: Diagnosis present

## 2020-01-07 DIAGNOSIS — R112 Nausea with vomiting, unspecified: Secondary | ICD-10-CM | POA: Diagnosis not present

## 2020-01-07 DIAGNOSIS — Z823 Family history of stroke: Secondary | ICD-10-CM | POA: Diagnosis not present

## 2020-01-07 DIAGNOSIS — T39315A Adverse effect of propionic acid derivatives, initial encounter: Secondary | ICD-10-CM | POA: Diagnosis not present

## 2020-01-07 DIAGNOSIS — R634 Abnormal weight loss: Secondary | ICD-10-CM

## 2020-01-07 DIAGNOSIS — F419 Anxiety disorder, unspecified: Secondary | ICD-10-CM | POA: Diagnosis present

## 2020-01-07 DIAGNOSIS — M4626 Osteomyelitis of vertebra, lumbar region: Secondary | ICD-10-CM | POA: Diagnosis present

## 2020-01-07 DIAGNOSIS — Z7984 Long term (current) use of oral hypoglycemic drugs: Secondary | ICD-10-CM

## 2020-01-07 DIAGNOSIS — Z6822 Body mass index (BMI) 22.0-22.9, adult: Secondary | ICD-10-CM

## 2020-01-07 DIAGNOSIS — Z8249 Family history of ischemic heart disease and other diseases of the circulatory system: Secondary | ICD-10-CM

## 2020-01-07 DIAGNOSIS — N400 Enlarged prostate without lower urinary tract symptoms: Secondary | ICD-10-CM | POA: Diagnosis not present

## 2020-01-07 DIAGNOSIS — E1169 Type 2 diabetes mellitus with other specified complication: Secondary | ICD-10-CM | POA: Diagnosis not present

## 2020-01-07 DIAGNOSIS — R52 Pain, unspecified: Secondary | ICD-10-CM

## 2020-01-07 DIAGNOSIS — K648 Other hemorrhoids: Secondary | ICD-10-CM | POA: Diagnosis not present

## 2020-01-07 DIAGNOSIS — E119 Type 2 diabetes mellitus without complications: Secondary | ICD-10-CM

## 2020-01-07 DIAGNOSIS — Z83438 Family history of other disorder of lipoprotein metabolism and other lipidemia: Secondary | ICD-10-CM

## 2020-01-07 DIAGNOSIS — Z803 Family history of malignant neoplasm of breast: Secondary | ICD-10-CM

## 2020-01-07 DIAGNOSIS — Z20822 Contact with and (suspected) exposure to covid-19: Secondary | ICD-10-CM | POA: Diagnosis not present

## 2020-01-07 DIAGNOSIS — Z792 Long term (current) use of antibiotics: Secondary | ICD-10-CM

## 2020-01-07 DIAGNOSIS — Z833 Family history of diabetes mellitus: Secondary | ICD-10-CM

## 2020-01-07 DIAGNOSIS — Z825 Family history of asthma and other chronic lower respiratory diseases: Secondary | ICD-10-CM | POA: Diagnosis not present

## 2020-01-07 DIAGNOSIS — E785 Hyperlipidemia, unspecified: Secondary | ICD-10-CM | POA: Diagnosis present

## 2020-01-07 DIAGNOSIS — E43 Unspecified severe protein-calorie malnutrition: Secondary | ICD-10-CM | POA: Insufficient documentation

## 2020-01-07 DIAGNOSIS — R627 Adult failure to thrive: Secondary | ICD-10-CM | POA: Diagnosis not present

## 2020-01-07 DIAGNOSIS — K30 Functional dyspepsia: Secondary | ICD-10-CM | POA: Diagnosis present

## 2020-01-07 DIAGNOSIS — K635 Polyp of colon: Secondary | ICD-10-CM | POA: Diagnosis not present

## 2020-01-07 DIAGNOSIS — F329 Major depressive disorder, single episode, unspecified: Secondary | ICD-10-CM | POA: Diagnosis present

## 2020-01-07 DIAGNOSIS — K2970 Gastritis, unspecified, without bleeding: Secondary | ICD-10-CM | POA: Diagnosis not present

## 2020-01-07 DIAGNOSIS — K253 Acute gastric ulcer without hemorrhage or perforation: Secondary | ICD-10-CM | POA: Diagnosis not present

## 2020-01-07 DIAGNOSIS — K279 Peptic ulcer, site unspecified, unspecified as acute or chronic, without hemorrhage or perforation: Secondary | ICD-10-CM | POA: Diagnosis not present

## 2020-01-07 LAB — COMPREHENSIVE METABOLIC PANEL
ALT: 13 U/L (ref 0–44)
AST: 24 U/L (ref 15–41)
Albumin: 4.7 g/dL (ref 3.5–5.0)
Alkaline Phosphatase: 80 U/L (ref 38–126)
Anion gap: 12 (ref 5–15)
BUN: 12 mg/dL (ref 8–23)
CO2: 25 mmol/L (ref 22–32)
Calcium: 9.1 mg/dL (ref 8.9–10.3)
Chloride: 92 mmol/L — ABNORMAL LOW (ref 98–111)
Creatinine, Ser: 0.76 mg/dL (ref 0.61–1.24)
GFR calc Af Amer: >60 mL/min (ref >60)
GFR calc non Af Amer: >60 mL/min (ref >60)
Glucose, Bld: 147 mg/dL — ABNORMAL HIGH (ref 70–99)
Potassium: 3.8 mmol/L (ref 3.5–5.1)
Sodium: 129 mmol/L — ABNORMAL LOW (ref 135–145)
Total Bilirubin: 1.3 mg/dL — ABNORMAL HIGH (ref 0.3–1.2)
Total Protein: 7.5 g/dL (ref 6.5–8.1)

## 2020-01-07 LAB — CBC WITH DIFFERENTIAL/PLATELET
Abs Immature Granulocytes: 0.03 10*3/uL (ref 0.00–0.07)
Basophils Absolute: 0 10*3/uL (ref 0.0–0.1)
Basophils Relative: 1 %
Eosinophils Absolute: 0.1 10*3/uL (ref 0.0–0.5)
Eosinophils Relative: 1 %
HCT: 43 % (ref 39.0–52.0)
Hemoglobin: 14.4 g/dL (ref 13.0–17.0)
Immature Granulocytes: 0 %
Lymphocytes Relative: 6 %
Lymphs Abs: 0.5 10*3/uL — ABNORMAL LOW (ref 0.7–4.0)
MCH: 30.4 pg (ref 26.0–34.0)
MCHC: 33.5 g/dL (ref 30.0–36.0)
MCV: 90.9 fL (ref 80.0–100.0)
Monocytes Absolute: 0.7 10*3/uL (ref 0.1–1.0)
Monocytes Relative: 8 %
Neutro Abs: 7.1 10*3/uL (ref 1.7–7.7)
Neutrophils Relative %: 84 %
Platelets: 318 10*3/uL (ref 150–400)
RBC: 4.73 MIL/uL (ref 4.22–5.81)
RDW: 15 % (ref 11.5–15.5)
WBC: 8.4 10*3/uL (ref 4.0–10.5)
nRBC: 0 % (ref 0.0–0.2)

## 2020-01-07 LAB — URINALYSIS, ROUTINE W REFLEX MICROSCOPIC
Bilirubin Urine: NEGATIVE
Glucose, UA: NEGATIVE mg/dL
Hgb urine dipstick: NEGATIVE
Ketones, ur: 40 mg/dL — AB
Leukocytes,Ua: NEGATIVE
Nitrite: NEGATIVE
Protein, ur: NEGATIVE mg/dL
Specific Gravity, Urine: 1.015 (ref 1.005–1.030)
pH: 6.5 (ref 5.0–8.0)

## 2020-01-07 LAB — SEDIMENTATION RATE: Sed Rate: 3 mm/hr (ref 0–16)

## 2020-01-07 LAB — SARS CORONAVIRUS 2 BY RT PCR (HOSPITAL ORDER, PERFORMED IN ~~LOC~~ HOSPITAL LAB): SARS Coronavirus 2: NEGATIVE

## 2020-01-07 LAB — C-REACTIVE PROTEIN: CRP: 0.5 mg/dL (ref <1.0)

## 2020-01-07 LAB — LIPASE, BLOOD: Lipase: 15 U/L (ref 11–51)

## 2020-01-07 MED ORDER — ONDANSETRON 4 MG PO TBDP
4.0000 mg | ORAL_TABLET | Freq: Once | ORAL | Status: AC
  Administered 2020-01-07: 4 mg via ORAL
  Filled 2020-01-07: qty 1

## 2020-01-07 MED ORDER — HYDROCODONE-ACETAMINOPHEN 10-325 MG PO TABS
1.0000 | ORAL_TABLET | Freq: Once | ORAL | Status: AC
  Administered 2020-01-08: 1 via ORAL
  Filled 2020-01-07: qty 1

## 2020-01-07 MED ORDER — DIAZEPAM 5 MG PO TABS: 10.0000 mg | ORAL_TABLET | Freq: Once | ORAL | Status: DC

## 2020-01-07 MED ORDER — SODIUM CHLORIDE 0.9 % IV BOLUS
1000.0000 mL | Freq: Once | INTRAVENOUS | Status: AC
  Administered 2020-01-07: 1000 mL via INTRAVENOUS

## 2020-01-07 MED ORDER — DIAZEPAM 5 MG PO TABS
10.0000 mg | ORAL_TABLET | Freq: Once | ORAL | Status: AC
  Administered 2020-01-08: 10 mg via ORAL
  Filled 2020-01-07: qty 2

## 2020-01-07 NOTE — Telephone Encounter (Signed)
Patient wife called stating she is concerned about her spouse , he is losing weight 3 lbs since yesterday. Patient's wife thinks patient needs to be admitted to hospital.   Please Advise

## 2020-01-07 NOTE — ED Notes (Signed)
Pt transferred to [facility] via [facility]. 

## 2020-01-07 NOTE — ED Notes (Signed)
Pt is requesting something for nausea and also states he feels hungry and would like to eat  Pt waiting on inpt bed  Alert and oriented x 3  Skin warm and dry  Pt is c/o back pain  States he took a pain pill at 4pm

## 2020-01-07 NOTE — ED Notes (Signed)
Pt reported now to this RN that he took his home pain med "Hydrocodone" around 1600. Made aware to inform staff prior to take any medication for his care safety.

## 2020-01-07 NOTE — Treatment Plan (Addendum)
74 yo with hx thoracolumbar fusion c/b discitis and osteomyelitis and epidural abscess, HTN,  T2DM and multiple other medical problems presenting to the ED for weight loss, nausea, and poor PO intake.  Per EDP, 60 lbs weight loss over 2 months.  Last saw Dr. Tommy Medal in 5/21, at that time imaging T4-5 discitis osteomyelitis.  Decreased conspicuity of ventral epidural abscess, epidural phelgmon and spinal canal narrowing at T4-5 level.  Unchanged T4-5 cord edema.  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  Presented to ED on 12/25/19 and had CT abd/pelvis with evidence of discitis/osteo and paraspinal soft tissue thickening similar to comparison CT/MRI's, pulm nodules.  Discharged with plans for outpatient follow up.  Represented today with progressive weight loss.  Appears plans in place for outpatient RUQ with GI.  Labs notable for hyponatremia.  Will admit for weight loss/failure to thrive to obs, med surg.  Requested repeat ESR/CRP.  Requested COVID testing.

## 2020-01-07 NOTE — ED Triage Notes (Signed)
Pt c/o weight loss, n/v over last 2-3 months-NAD-steady gait

## 2020-01-07 NOTE — Telephone Encounter (Signed)
Pt in ED.  

## 2020-01-07 NOTE — ED Notes (Signed)
Report given to David with [facility] 

## 2020-01-07 NOTE — Progress Notes (Signed)
TOC CM spoke to pt's wife and offered choice for [name]. States pt had [facility] in the past. Spoke to ED provider for [facility] orders with [facility]. [place], [place] ED TOC CM [phone]

## 2020-01-07 NOTE — ED Provider Notes (Addendum)
[place] EMERGENCY DEPARTMENT Provider Note   CSN: [id number] Arrival date & time: 01/07/20  1121     History Chief Complaint  Patient presents with  . Weight Loss    Luis Brown is a 74 y.o. male.  HPI 74 year old male with a history of DM type II, tension, anxiety, depression, degenerative disc disease with back surgery back in August, when he developed an epidural abscess/infection and was taken back to surgery.  He has had 2 PICC line since then, and is still taking oral doxycycline.  He is followed by infectious disease.  He was seen back in the ED here on 12/25/2019 with complaints of 30 pounds over the last 3 months, vomiting, nausea, progressive dizziness and feeling off balance, largely bedbound over the last month.  In the ED he was found to be hyponatremic, CT scan showed questionable gallstones.  He was referred to GI and deemed that his hyponatremia had been gradual and not considered urgent.  He saw Dr. Michail Sermon on Monday who did not think that his symptoms were due to gallstones but had him scheduled for an abdominal ultrasound on Thursday 6/24.  His wife continued to express concerns over his continuous weight loss and poor p.o. intake.  She contacted his PCP and was encouraged to come to the ER.  Patient has had no new onset neurological deficits, no increase in weakness, however continues to say that he is nauseous and tolerate much food or liquids.  Denies any fevers, chills, chest pain, shortness of breath, back pain, abdominal pain, dysuria, hematuria, diarrhea.    Past Medical History:  Diagnosis Date  . Acute pharyngitis 10/21/2013  . Allergy    grass and pollen  . Anxiety and depression 10/25/2011  . BPH (benign prostatic hyperplasia) 04/23/2012  . Chicken pox as a child  . DDD (degenerative disc disease)    cervical responds to steroid injections and low back required surgery  . DDD (degenerative disc disease), lumbosacral   . Diabetes mellitus     pre  . Dyspnea   . ED (erectile dysfunction) 04/23/2012  . Elevated BP   . 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 Esophageal reflux 02/10/2015  . Fatigue   . HTN (hypertension)   . Hyperglycemia    preDM   . Hyperlipidemia   . Insomnia   . Low back pain radiating to both legs 01/16/2017  . Measles as a child  . Medicare annual wellness visit, subsequent 02/10/2015  . Overweight(278.02)   . Peripheral neuropathy 08/07/2019  . Personal history of colonic polyps 10/27/2012   Follows with [facility]  . Pneumonia    " walking"  . Preventative health care   . 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  . Wears glasses     Patient Active Problem List   Diagnosis Date Noted  . Weight loss 01/07/2020  . Abdominal pain 12/30/2019  . Nausea and vomiting 12/30/2019  . Weight loss, abnormal 12/30/2019  . Change in bowel habits 12/30/2019  . 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 Medication monitoring encounter 08/26/2019  . Peripheral neuropathy 08/07/2019  . Alcohol abuse 05/09/2019  . Hyponatremia 05/05/2019  . 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[place]) 04/04/2019  . Fusion of spine of thoracolumbar region 03/12/2019  . Status post thoracic spinal fusion 02/12/2019  . 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 Thoracic spinal stenosis 04/17/2017  . Painful orthopaedic hardware ([place]) 04/17/2017  . 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 Loosening of hardware  in spine ([place]) 04/17/2017  . Low back pain radiating to both legs 01/16/2017  . Esophageal reflux 02/10/2015  . Medicare annual wellness visit, subsequent 02/10/2015  . Personal history of colonic polyps 10/27/2012  . 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  . BPH (benign prostatic hyperplasia) 04/23/2012  . ED (erectile dysfunction) 04/23/2012  . Allergic state 10/25/2011  . Anxiety and depression 10/25/2011  . Asthma   . Diabetes mellitus without complication ([place])   . Hyperlipidemia   .  Overweight   . Insomnia   . Fatigue   . Preventative health care   . DDD (degenerative disc disease), lumbosacral   . Thoracic degenerative disc disease   . HTN (hypertension)     Past Surgical History:  Procedure Laterality Date  . BACK SURGERY  2012 and 1994   Dr Margret Chance, screws and cage in low back  . EYE SURGERY Bilateral    2016  . HARDWARE REVISION  03/12/2019   REVISION OF HARDWARE THORACIC TEN-THORACIC ELEVEN WITH APPLICATION OF ADDITIONAL RODS AND ROD SLEEVES THORACIC EIGHT-THORACIC TWELVE 12-LUMBAR ONE (N/A )  . LAMINECTOMY  02/12/2019   DECOMPRESSIVE LAMINECTOMY THORACIC NINE-THORACIC TEN AND THORACIC TEN-THORACIC ELEVEN, EXTENSION OF THORACOLUMBAR FUSION FROM THORACIC TEN TO THORACIC FIVE, LOCAL BONE GRAFT, ALLOGRAFT AND VIVIGEN (N/A)  . SPINAL FUSION N/A 02/12/2019   Procedure: DECOMPRESSIVE LAMINECTOMY THORACIC NINE-THORACIC TEN AND THORACIC TEN-THORACIC ELEVEN, EXTENSION OF THORACOLUMBAR FUSION FROM THORACIC TEN TO THORACIC FIVE, LOCAL BONE GRAFT, ALLOGRAFT AND VIVIGEN;  Surgeon: Jessy Oto, MD;  Location: [place];  Service: Orthopedics;  Laterality: N/A;  DECOMPRESSIVE LAMINECTOMY THORACIC NINE-THORACIC TEN AND THORACIC TEN-THORACIC ELEVEN, EXTENSION OF TH  . SPINAL FUSION N/A 03/12/2019   Procedure: REVISION OF HARDWARE THORACIC TEN-THORACIC ELEVEN WITH APPLICATION OF ADDITIONAL RODS AND ROD SLEEVES THORACIC EIGHT-THORACIC TWELVE 12-LUMBAR ONE;  Surgeon: Jessy Oto, MD;  Location: [place];  Service: Orthopedics;  Laterality: N/A;  . TONSILLECTOMY    . TONSILLECTOMY     as child  . torn rotator cuff  2010   right       Family History  Problem Relation Age of Onset  . Hypertension Mother   . Diabetes Mother        type 2  . Cancer Mother 43       breast in remission  . Emphysema Father        smoker  . COPD Father        smoker  . Stroke Father 59       mini  . Heart disease Father   . Hypertension Sister   . Hyperlipidemia Sister   . Scoliosis Sister     . Osteoporosis Sister   . Hypertension Maternal Grandmother   . Scoliosis Maternal Grandmother   . Heart disease Maternal Grandfather   . Heart disease Paternal Grandfather        smoker  . Heart disease Daughter     Social History   Tobacco Use  . Smoking status: Former Smoker    Packs/day: 1.00    Years: 20.00    Pack years: 20.00    Types: Cigarettes    Quit date: 07/18/1989    Years since quitting: 30.4  . Smokeless tobacco: Never Used  . Tobacco comment: off and on smoking for the 20 yrs  Vaping Use  . Vaping Use: Never used  Substance Use Topics  . Alcohol use: Yes    Comment: drink or two a day- mixed drink  . Drug use: No  Home Medications Prior to Admission medications   Medication Sig Start Date End Date Taking? Authorizing Provider  atorvastatin (LIPITOR) 20 MG tablet TAKE 1/2 TABLET BY MOUTH DAILY AT 6 PM 05/02/19   Saguier, Percell Miller, PA-C  busPIRone (BUSPAR) 7.5 MG tablet 1-2 TAB BY MOUTH 3 TIMES A DAY FOR ANXIETY 12/17/19   Saguier, Percell Miller, PA-C  cetirizine (ZYRTEC) 10 MG tablet Take 10 mg by mouth daily. In the morning    [provider]  diazepam (VALIUM) 10 MG tablet TAKE 1 TABLET(10 MG) BY MOUTH AT BEDTIME AS NEEDED 10/28/19   Saguier, Percell Miller, PA-C  doxycycline (VIBRA-TABS) 100 MG tablet Take 1 tablet (100 mg total) by mouth 2 (two) times daily. 12/31/19   Truman Hayward, MD  famotidine (PEPCID) 40 MG tablet TAKE 1 TABLET BY MOUTH EVERYDAY AT BEDTIME 09/07/19   Saguier, Percell Miller, PA-C  FERRETTS 325 (106 Fe) MG TABS tablet TAKE 1 TABLET (106 MG OF IRON TOTAL) BY MOUTH DAILY. 12/30/19   Saguier, Percell Miller, PA-C  glucose blood (CONTOUR NEXT TEST) test strip Check blood sugar once daily 02/25/19   Saguier, Percell Miller, PA-C  HYDROcodone-acetaminophen [facility]) 10-325 MG tablet Take 1 tablet by mouth every 6 (six) hours as needed. 01/06/20   Jessy Oto, MD  ibuprofen (ADVIL) 600 MG tablet TAKE 1 TABLET (600 MG TOTAL) BY MOUTH EVERY 8 (EIGHT) HOURS AS NEEDED.  12/23/19   Jessy Oto, MD  lisinopril (ZESTRIL) 5 MG tablet TAKE 1 TABLET BY MOUTH TWICE A DAY 11/21/19   Saguier, Percell Miller, PA-C  metFORMIN (GLUCOPHAGE) 1000 MG tablet TAKE 1 TABLET (1,000 MG TOTAL) BY MOUTH 2 (TWO) TIMES DAILY WITH A MEAL. 12/19/19   Saguier, Percell Miller, PA-C  ondansetron (ZOFRAN ODT) 4 MG disintegrating tablet Take 1 tablet (4 mg total) by mouth every 8 (eight) hours as needed for nausea or vomiting. 12/25/19   Gareth Morgan, MD  pregabalin (LYRICA) 75 MG capsule Take 1 capsule (75 mg total) by mouth 2 (two) times daily. 11/14/19   Jessy Oto, MD  sitaGLIPtin (JANUVIA) 50 MG tablet Take 1 tablet (50 mg total) by mouth daily. 11/25/19   Saguier, Percell Miller, PA-C  tamsulosin (FLOMAX) 0.4 MG CAPS capsule Take 2 capsules (0.8 mg total) by mouth daily. 11/18/19   Saguier, Percell Miller, PA-C  testosterone cypionate (DEPOTESTOSTERONE CYPIONATE) 200 MG/ML injection Inject 1 mL (200 mg total) into the muscle every 14 (fourteen) days. 11/17/19   Saguier, Percell Miller, PA-C  tiZANidine (ZANAFLEX) 4 MG tablet Take 1.5 tablets (6 mg total) by mouth every 6 (six) hours as needed for muscle spasms. 12/20/19   Jessy Oto, MD  venlafaxine XR (EFFEXOR-XR) 150 MG 24 hr capsule Take 1 capsule (150 mg total) by mouth daily with breakfast. 11/06/19   Saguier, Percell Miller, PA-C    Allergies    Patient has no known allergies.  Review of Systems   Review of Systems  Constitutional: Negative for chills and fever.  HENT: Negative for ear pain and sore throat.   Eyes: Negative for pain and visual disturbance.  Respiratory: Negative for cough and shortness of breath.   Cardiovascular: Negative for chest pain and palpitations.  Gastrointestinal: Positive for nausea and vomiting. Negative for abdominal pain.  Genitourinary: Negative for dysuria and hematuria.  Musculoskeletal: Negative for arthralgias and back pain.  Skin: Negative for color change and rash.  Neurological: Positive for weakness. Negative for seizures and syncope.   All other systems reviewed and are negative.   Physical Exam Updated Vital Signs BP Marland Kitchen)  156/71   Pulse 83   Temp 98.3 F (36.8 C) (Oral)   Resp 16   Ht 5\' 10"  (1.778 m)   Wt 72.1 kg   SpO2 99%   BMI 22.81 kg/m   Physical Exam Vitals and nursing note reviewed.  Constitutional:      General: He is not in acute distress.    Appearance: Normal appearance. He is well-developed. He is not ill-appearing or diaphoretic.  HENT:     Head: Normocephalic and atraumatic.     Mouth/Throat:     Mouth: Mucous membranes are moist.     Pharynx: Oropharynx is clear.  Eyes:     Conjunctiva/sclera: Conjunctivae normal.  Cardiovascular:     Rate and Rhythm: Normal rate and regular rhythm.     Pulses: Normal pulses.     Heart sounds: Normal heart sounds. No murmur heard.   Pulmonary:     Effort: Pulmonary effort is normal. No respiratory distress.     Breath sounds: Normal breath sounds.  Abdominal:     General: Abdomen is flat.     Palpations: Abdomen is soft.     Tenderness: There is no abdominal tenderness.  Musculoskeletal:        General: No swelling or tenderness. Normal range of motion.     Cervical back: Neck supple.     Right lower leg: No edema.     Left lower leg: No edema.  Skin:    General: Skin is warm and dry.     Findings: No erythema or rash.  Neurological:     General: No focal deficit present.     Mental Status: He is alert.     Sensory: No sensory deficit.     Motor: No weakness.     Comments: Mental Status:  Alert, thought content appropriate, able to give a coherent history. Speech fluent without evidence of aphasia. Able to follow 2 step commands without difficulty.  Cranial Nerves:  II: Peripheral visual fields grossly normal, pupils equal, round, reactive to light III,IV, VI: ptosis not present, extra-ocular motions intact bilaterally  V,VII: smile symmetric, facial light touch sensation equal VIII: hearing grossly normal to voice  X: uvula elevates  symmetrically  XI: bilateral shoulder shrug symmetric and strong XII: midline tongue extension without fassiculations Motor:  Normal tone. 5/5 strength of BUE and BLE major muscle groups including strong and equal grip strength and dorsiflexion/plantar flexion Sensory: light touch normal in all extremities. Cerebellar: normal finger-to-nose with bilateral upper extremities, Romberg sign absent Gait: normal gait and balance.   Psychiatric:        Mood and Affect: Mood normal.        Behavior: Behavior normal.     ED Results / Procedures / Treatments   Labs (all labs ordered are listed, but only abnormal results are displayed) Labs Reviewed  CBC WITH DIFFERENTIAL/PLATELET - Abnormal; Notable for the following components:      Result Value   Lymphs Abs 0.5 (*)    All other components within normal limits  COMPREHENSIVE METABOLIC PANEL - Abnormal; Notable for the following components:   Sodium 129 (*)    Chloride 92 (*)    Glucose, Bld 147 (*)    Total Bilirubin 1.3 (*)    All other components within normal limits  URINALYSIS, ROUTINE W REFLEX MICROSCOPIC - Abnormal; Notable for the following components:   Ketones, ur 40 (*)    All other components within normal limits  SARS CORONAVIRUS 2 BY RT  PCR (HOSPITAL ORDER, [place] [facility])  LIPASE, BLOOD  SEDIMENTATION RATE  C-REACTIVE PROTEIN    EKG None  Radiology DG Chest Portable 1 View  Result Date: 01/07/2020 CLINICAL DATA:  Weight loss. EXAM: PORTABLE CHEST 1 VIEW COMPARISON:  02/08/2019. FINDINGS: Mediastinum and hilar structures normal. Heart size normal. No focal infiltrate. No pleural effusion or pneumothorax. Prior thoracic spine fusion. IMPRESSION: No acute cardiopulmonary disease. Electronically Signed   By: Marcello Moores  Register   On: 01/07/2020 13:03    Procedures Procedures (including critical care time)  Medications Ordered in ED Medications  sodium chloride 0.9 % bolus 1,000 mL (0 mLs  Intravenous Stopped 01/07/20 1611)    ED Course  I have reviewed the triage vital signs and the nursing notes.  Pertinent labs & imaging results that were available during my care of the patient were reviewed by me and considered in my medical decision making (see chart for details).  Clinical Course as of Jan 07 1636  Tue Jan 07, 2020  [place]     [MB]    Clinical Course User Index [MB] Lyndel Safe   MDM Rules/Calculators/A&P                         74 year old male with progressive weakness, p.o  intake, nausea and vomiting, 30 pound weight loss over the last 3 months since his second surgery for his low back. On presentation, patient is alert and oriented, nontoxic-appearing, no acute distress, speaking full sentences.  He is alert and oriented x4.  History provided by him and his wife at bedside.  Physical exam without any acute abnormalities, normal strength, no acute neurological deficits.  Abdomen soft and nontender, lungs clear.  Although he is slightly hypertensive here in the ED, other vitals are reassuring.  CMP with hyponatremia of 129 which appears to be stable from previous ED visits.  Normal potassium, chloride of 92.  Glucose of 147.  Normal creatinine and BUN.  Normal AST/ALT.  Lipase normal.  UA without evidence of UTI, however with ketones likely in the setting of dehydration.  CBC without leukocytosis, normal hemoglobin.  CT of the abdomen performed on 12/26/2019, he has had no new abdominal complaints and thus I do not think any additional imaging is needed at this time.  He also had a CT angio of the chest done on 6/9 with no respiratory complaints.  CT of the head on 6/1 without any acute changes.  He has had no new neurological deficits or complaints since then, I do not think any additional imaging is needed at this time.  Discussed the case with Dr. Roslynn Amble who also saw and evaluated the patient, patient's wife is concerned about his continuous weight loss  and poor p.o. intake.  She is stressing that he needs admission as she cannot bring him home in this state.  Patient was able to ambulate in the ED without assistance.  However, given continuous weight loss, poor p.o. intake, nausea and vomiting without a clear diagnosis, consulted hospitalist Dr. Florene Glen for admission and transfer to [facility].  He has agreed to admit the patient for further evaluation and treatment.  Patient will likely need GI consult, approximately infectious disease.  Patient has remained hemodynamically stable here in the ED and is stable for transfer.  Final Clinical Impression(s) / ED Diagnoses Final diagnoses:  Weight loss  Weakness  Nausea and vomiting, intractability of vomiting not specified, unspecified  vomiting type  Hyponatremia    Rx / DC Orders ED Discharge Orders    None           Lyndel Safe 01/07/20 1637    Lucrezia Starch, MD 01/08/20 1004

## 2020-01-07 NOTE — Telephone Encounter (Signed)
Pt at ED.

## 2020-01-08 ENCOUNTER — Observation Stay (HOSPITAL_COMMUNITY): Payer: Medicare Other

## 2020-01-08 ENCOUNTER — Encounter (HOSPITAL_COMMUNITY): Payer: Self-pay | Admitting: Family Medicine

## 2020-01-08 DIAGNOSIS — R112 Nausea with vomiting, unspecified: Secondary | ICD-10-CM | POA: Diagnosis not present

## 2020-01-08 DIAGNOSIS — K838 Other specified diseases of biliary tract: Secondary | ICD-10-CM | POA: Diagnosis not present

## 2020-01-08 DIAGNOSIS — Z823 Family history of stroke: Secondary | ICD-10-CM | POA: Diagnosis not present

## 2020-01-08 DIAGNOSIS — K8050 Calculus of bile duct without cholangitis or cholecystitis without obstruction: Secondary | ICD-10-CM | POA: Diagnosis not present

## 2020-01-08 DIAGNOSIS — K648 Other hemorrhoids: Secondary | ICD-10-CM | POA: Diagnosis present

## 2020-01-08 DIAGNOSIS — Z8262 Family history of osteoporosis: Secondary | ICD-10-CM | POA: Diagnosis not present

## 2020-01-08 DIAGNOSIS — K2980 Duodenitis without bleeding: Secondary | ICD-10-CM | POA: Diagnosis not present

## 2020-01-08 DIAGNOSIS — K2960 Other gastritis without bleeding: Secondary | ICD-10-CM | POA: Diagnosis not present

## 2020-01-08 DIAGNOSIS — K279 Peptic ulcer, site unspecified, unspecified as acute or chronic, without hemorrhage or perforation: Secondary | ICD-10-CM | POA: Diagnosis not present

## 2020-01-08 DIAGNOSIS — K209 Esophagitis, unspecified without bleeding: Secondary | ICD-10-CM | POA: Diagnosis not present

## 2020-01-08 DIAGNOSIS — R109 Unspecified abdominal pain: Secondary | ICD-10-CM | POA: Diagnosis not present

## 2020-01-08 DIAGNOSIS — E785 Hyperlipidemia, unspecified: Secondary | ICD-10-CM | POA: Diagnosis present

## 2020-01-08 DIAGNOSIS — G8929 Other chronic pain: Secondary | ICD-10-CM | POA: Diagnosis present

## 2020-01-08 DIAGNOSIS — I10 Essential (primary) hypertension: Secondary | ICD-10-CM | POA: Diagnosis not present

## 2020-01-08 DIAGNOSIS — N400 Enlarged prostate without lower urinary tract symptoms: Secondary | ICD-10-CM | POA: Diagnosis present

## 2020-01-08 DIAGNOSIS — R935 Abnormal findings on diagnostic imaging of other abdominal regions, including retroperitoneum: Secondary | ICD-10-CM | POA: Diagnosis not present

## 2020-01-08 DIAGNOSIS — E871 Hypo-osmolality and hyponatremia: Secondary | ICD-10-CM | POA: Diagnosis not present

## 2020-01-08 DIAGNOSIS — K921 Melena: Secondary | ICD-10-CM | POA: Diagnosis not present

## 2020-01-08 DIAGNOSIS — R633 Feeding difficulties: Secondary | ICD-10-CM | POA: Diagnosis not present

## 2020-01-08 DIAGNOSIS — Z20822 Contact with and (suspected) exposure to covid-19: Secondary | ICD-10-CM | POA: Diagnosis not present

## 2020-01-08 DIAGNOSIS — R627 Adult failure to thrive: Secondary | ICD-10-CM | POA: Diagnosis not present

## 2020-01-08 DIAGNOSIS — R634 Abnormal weight loss: Secondary | ICD-10-CM

## 2020-01-08 DIAGNOSIS — R932 Abnormal findings on diagnostic imaging of liver and biliary tract: Secondary | ICD-10-CM | POA: Diagnosis not present

## 2020-01-08 DIAGNOSIS — K253 Acute gastric ulcer without hemorrhage or perforation: Secondary | ICD-10-CM | POA: Diagnosis not present

## 2020-01-08 DIAGNOSIS — T39315A Adverse effect of propionic acid derivatives, initial encounter: Secondary | ICD-10-CM | POA: Diagnosis present

## 2020-01-08 DIAGNOSIS — Z803 Family history of malignant neoplasm of breast: Secondary | ICD-10-CM | POA: Diagnosis not present

## 2020-01-08 DIAGNOSIS — Z8249 Family history of ischemic heart disease and other diseases of the circulatory system: Secondary | ICD-10-CM | POA: Diagnosis not present

## 2020-01-08 DIAGNOSIS — M4626 Osteomyelitis of vertebra, lumbar region: Secondary | ICD-10-CM | POA: Diagnosis not present

## 2020-01-08 DIAGNOSIS — M4624 Osteomyelitis of vertebra, thoracic region: Secondary | ICD-10-CM | POA: Diagnosis present

## 2020-01-08 DIAGNOSIS — E43 Unspecified severe protein-calorie malnutrition: Secondary | ICD-10-CM | POA: Diagnosis not present

## 2020-01-08 DIAGNOSIS — K264 Chronic or unspecified duodenal ulcer with hemorrhage: Secondary | ICD-10-CM | POA: Diagnosis present

## 2020-01-08 DIAGNOSIS — E1169 Type 2 diabetes mellitus with other specified complication: Secondary | ICD-10-CM | POA: Diagnosis present

## 2020-01-08 DIAGNOSIS — Z86010 Personal history of colonic polyps: Secondary | ICD-10-CM | POA: Diagnosis not present

## 2020-01-08 DIAGNOSIS — K254 Chronic or unspecified gastric ulcer with hemorrhage: Secondary | ICD-10-CM | POA: Diagnosis not present

## 2020-01-08 DIAGNOSIS — K2970 Gastritis, unspecified, without bleeding: Secondary | ICD-10-CM | POA: Diagnosis not present

## 2020-01-08 DIAGNOSIS — Z79899 Other long term (current) drug therapy: Secondary | ICD-10-CM | POA: Diagnosis not present

## 2020-01-08 DIAGNOSIS — Z825 Family history of asthma and other chronic lower respiratory diseases: Secondary | ICD-10-CM | POA: Diagnosis not present

## 2020-01-08 DIAGNOSIS — K635 Polyp of colon: Secondary | ICD-10-CM | POA: Diagnosis present

## 2020-01-08 LAB — GLUCOSE, CAPILLARY
Glucose-Capillary: 107 mg/dL — ABNORMAL HIGH (ref 70–99)
Glucose-Capillary: 124 mg/dL — ABNORMAL HIGH (ref 70–99)
Glucose-Capillary: 223 mg/dL — ABNORMAL HIGH (ref 70–99)
Glucose-Capillary: 84 mg/dL (ref 70–99)

## 2020-01-08 LAB — CBC
HCT: 40.1 % (ref 39.0–52.0)
Hemoglobin: 13.5 g/dL (ref 13.0–17.0)
MCH: 29.9 pg (ref 26.0–34.0)
MCHC: 33.7 g/dL (ref 30.0–36.0)
MCV: 88.7 fL (ref 80.0–100.0)
Platelets: 286 10*3/uL (ref 150–400)
RBC: 4.52 MIL/uL (ref 4.22–5.81)
RDW: 15.1 % (ref 11.5–15.5)
WBC: 7.3 10*3/uL (ref 4.0–10.5)
nRBC: 0 % (ref 0.0–0.2)

## 2020-01-08 LAB — BASIC METABOLIC PANEL
Anion gap: 10 (ref 5–15)
BUN: 11 mg/dL (ref 8–23)
CO2: 26 mmol/L (ref 22–32)
Calcium: 8.9 mg/dL (ref 8.9–10.3)
Chloride: 98 mmol/L (ref 98–111)
Creatinine, Ser: 0.74 mg/dL (ref 0.61–1.24)
GFR calc Af Amer: >60 mL/min (ref >60)
GFR calc non Af Amer: >60 mL/min (ref >60)
Glucose, Bld: 117 mg/dL — ABNORMAL HIGH (ref 70–99)
Potassium: 4 mmol/L (ref 3.5–5.1)
Sodium: 134 mmol/L — ABNORMAL LOW (ref 135–145)

## 2020-01-08 LAB — HEPATIC FUNCTION PANEL
ALT: 14 U/L (ref 0–44)
AST: 20 U/L (ref 15–41)
Albumin: 4.3 g/dL (ref 3.5–5.0)
Alkaline Phosphatase: 72 U/L (ref 38–126)
Bilirubin, Direct: 0.2 mg/dL (ref 0.0–0.2)
Indirect Bilirubin: 1.3 mg/dL — ABNORMAL HIGH (ref 0.3–0.9)
Total Bilirubin: 1.5 mg/dL — ABNORMAL HIGH (ref 0.3–1.2)
Total Protein: 6.8 g/dL (ref 6.5–8.1)

## 2020-01-08 MED ORDER — ONDANSETRON HCL 4 MG/2ML IJ SOLN
4.0000 mg | Freq: Four times a day (QID) | INTRAMUSCULAR | Status: DC | PRN
  Administered 2020-01-08 – 2020-01-10 (×4): 4 mg via INTRAVENOUS
  Filled 2020-01-08 (×4): qty 2

## 2020-01-08 MED ORDER — BUSPIRONE HCL 5 MG PO TABS
7.5000 mg | ORAL_TABLET | Freq: Three times a day (TID) | ORAL | Status: DC
  Administered 2020-01-08 – 2020-01-10 (×5): 7.5 mg via ORAL
  Filled 2020-01-08 (×6): qty 2

## 2020-01-08 MED ORDER — PEG 3350-KCL-NA BICARB-NACL 420 G PO SOLR
4000.0000 mL | Freq: Once | ORAL | Status: AC
  Administered 2020-01-08: 4000 mL via ORAL

## 2020-01-08 MED ORDER — ENOXAPARIN SODIUM 40 MG/0.4ML ~~LOC~~ SOLN: 40.0000 mg | SUBCUTANEOUS | Status: DC

## 2020-01-08 MED ORDER — TIZANIDINE HCL 4 MG PO TABS
6.0000 mg | ORAL_TABLET | Freq: Four times a day (QID) | ORAL | Status: DC | PRN
  Administered 2020-01-08 – 2020-01-10 (×5): 6 mg via ORAL
  Filled 2020-01-08 (×5): qty 2

## 2020-01-08 MED ORDER — ONDANSETRON HCL 4 MG PO TABS
4.0000 mg | ORAL_TABLET | Freq: Four times a day (QID) | ORAL | Status: DC | PRN
  Administered 2020-01-08: 4 mg via ORAL
  Filled 2020-01-08: qty 1

## 2020-01-08 MED ORDER — VENLAFAXINE HCL ER 150 MG PO CP24
150.0000 mg | ORAL_CAPSULE | Freq: Every day | ORAL | Status: DC
  Administered 2020-01-10: 150 mg via ORAL
  Filled 2020-01-08 (×2): qty 1

## 2020-01-08 MED ORDER — LISINOPRIL 5 MG PO TABS
2.5000 mg | ORAL_TABLET | Freq: Every day | ORAL | Status: DC
  Administered 2020-01-08 – 2020-01-10 (×3): 2.5 mg via ORAL
  Filled 2020-01-08 (×4): qty 1

## 2020-01-08 MED ORDER — TAMSULOSIN HCL 0.4 MG PO CAPS
0.8000 mg | ORAL_CAPSULE | Freq: Every day | ORAL | Status: DC
  Administered 2020-01-08 – 2020-01-10 (×3): 0.8 mg via ORAL
  Filled 2020-01-08 (×3): qty 2

## 2020-01-08 MED ORDER — DOXYCYCLINE HYCLATE 100 MG PO TABS
100.0000 mg | ORAL_TABLET | Freq: Two times a day (BID) | ORAL | Status: DC
  Administered 2020-01-08 – 2020-01-10 (×5): 100 mg via ORAL
  Filled 2020-01-08 (×5): qty 1

## 2020-01-08 MED ORDER — PREGABALIN 75 MG PO CAPS
75.0000 mg | ORAL_CAPSULE | Freq: Two times a day (BID) | ORAL | Status: DC
  Administered 2020-01-08 – 2020-01-10 (×5): 75 mg via ORAL
  Filled 2020-01-08 (×5): qty 1

## 2020-01-08 MED ORDER — SODIUM CHLORIDE 0.9 % IV SOLN: INTRAVENOUS | Status: DC

## 2020-01-08 MED ORDER — DIAZEPAM 5 MG PO TABS
5.0000 mg | ORAL_TABLET | Freq: Every day | ORAL | Status: DC
  Administered 2020-01-08 – 2020-01-09 (×2): 5 mg via ORAL
  Filled 2020-01-08 (×2): qty 1

## 2020-01-08 MED ORDER — ENSURE ENLIVE PO LIQD
237.0000 mL | Freq: Two times a day (BID) | ORAL | Status: DC
  Administered 2020-01-09 – 2020-01-10 (×2): 237 mL via ORAL

## 2020-01-08 MED ORDER — FERROUS FUMARATE 324 (106 FE) MG PO TABS
1.0000 | ORAL_TABLET | Freq: Every day | ORAL | Status: DC
  Administered 2020-01-08 – 2020-01-10 (×3): 106 mg via ORAL
  Filled 2020-01-08 (×3): qty 1

## 2020-01-08 MED ORDER — ATORVASTATIN CALCIUM 10 MG PO TABS
10.0000 mg | ORAL_TABLET | Freq: Every day | ORAL | Status: DC
  Administered 2020-01-08 – 2020-01-10 (×3): 10 mg via ORAL
  Filled 2020-01-08 (×3): qty 1

## 2020-01-08 MED ORDER — INSULIN ASPART 100 UNIT/ML ~~LOC~~ SOLN
0.0000 [IU] | Freq: Three times a day (TID) | SUBCUTANEOUS | Status: DC
  Administered 2020-01-08: 3 [IU] via SUBCUTANEOUS
  Administered 2020-01-08: 1 [IU] via SUBCUTANEOUS
  Administered 2020-01-09: 3 [IU] via SUBCUTANEOUS
  Administered 2020-01-09 (×2): 1 [IU] via SUBCUTANEOUS
  Administered 2020-01-10: 2 [IU] via SUBCUTANEOUS

## 2020-01-08 MED ORDER — GADOBUTROL 1 MMOL/ML IV SOLN
7.0000 mL | Freq: Once | INTRAVENOUS | Status: AC | PRN
  Administered 2020-01-08: 7 mL via INTRAVENOUS

## 2020-01-08 MED ORDER — HYDROCODONE-ACETAMINOPHEN 10-325 MG PO TABS
1.0000 | ORAL_TABLET | Freq: Four times a day (QID) | ORAL | Status: DC | PRN
  Administered 2020-01-08 – 2020-01-10 (×6): 1 via ORAL
  Filled 2020-01-08 (×6): qty 1

## 2020-01-08 MED ORDER — PANTOPRAZOLE SODIUM 40 MG IV SOLR
40.0000 mg | Freq: Two times a day (BID) | INTRAVENOUS | Status: DC
  Administered 2020-01-08 – 2020-01-10 (×5): 40 mg via INTRAVENOUS
  Filled 2020-01-08 (×5): qty 40

## 2020-01-08 NOTE — Progress Notes (Signed)
TOC CM referral received while in ED setting for [facility]. Contacted Santiago Glad, [place] rep with new referral. Waiting confirmation. [place], [place] ED TOC CM [phone]

## 2020-01-08 NOTE — Progress Notes (Signed)
Initial Nutrition Assessment  DOCUMENTATION CODES:   Severe malnutrition in context of chronic illness  INTERVENTION:   When diet is advanced: -Ensure Enlive po BID, each supplement provides 350 kcal and 20 grams of protein -Ensure MAX Protein po daily, each supplement provides 150 kcal and 30 grams of protein -Multivitamin with minerals daily  Monitor magnesium, potassium, and phosphorus daily for at least 3 days, MD to replete as needed, as pt is at risk for refeeding syndrome.  NUTRITION DIAGNOSIS:   Severe Malnutrition related to chronic illness (chronic osteomyelitis, on long-term antibiotics) as evidenced by percent weight loss, energy intake < or equal to 75% for > or equal to 1 month, mild fat depletion, moderate muscle depletion.  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 Drucilla Schmidt of infectious disease and Dr. Louanne Skye of orthopedics.-He has been on antibiotics off and on since July 2020 for osteomyelitis, completed his second prolonged course of IV ceftriaxone in April followed by 3 months of doxycycline which he remains on at this time.-Presents to the ED with worsening nausea, intermittent episodes of vomiting, severe anorexia and 35 pound weight loss over the last 2 months.  Pt in room with wife at bedside. Pt reports not being able to tolerate foods for the last 3 months now and having persistent vomiting. Pt's wife states the most he can tolerate is some applesauce. Pt was drinking some Premier protein at home but stopped. Currently pt is on clear liquids, tolerating clears at this time. Plan  is for EGD tomorrow so will be NPO after midnight.  Pt started Zofran this past week and reports it helped his nausea.  Once diet is advanced, pt is interested in receiving Ensure Max which is an equivalent to [employer].   Per weight records, pt has lost 36 lbs since 4/29 (18% wt loss x 2 months, significant for time frame).  Medications: Ferrous fumarate, IV Zofran Labs reviewed: CBGs: 107-124 Low Na  NUTRITION - FOCUSED PHYSICAL EXAM:    Most Recent Value  Orbital Region No depletion  Upper Arm Region Mild depletion  Thoracic and Lumbar Region Unable to assess  Buccal Region Mild depletion  Temple Region Mild depletion  Clavicle Bone Region Mild depletion  Clavicle and Acromion Bone Region Mild depletion  Scapular Bone Region Unable to assess  Dorsal Hand Mild depletion  Patellar Region Moderate depletion  Anterior Thigh Region Moderate depletion  Posterior Calf Region Moderate depletion  Edema (RD Assessment) None  Hair Reviewed  Eyes Reviewed  Mouth Reviewed  Skin Reviewed  Nails Reviewed       Diet Order:   Diet Order            Diet NPO time specified  Diet effective now                 EDUCATION NEEDS:   No education needs have been identified at this time  Skin:  Skin Assessment: Reviewed RN Assessment  Last BM:  6/23  Height:   Ht Readings from Last 1 Encounters:  01/07/20 5\' 10"  (1.778 m)    Weight:  Wt Readings from Last 1 Encounters:  01/07/20 72.1 kg   BMI:  Body mass index is 22.81 kg/m.  Estimated Nutritional Needs:   Kcal:  2150-2350  Protein:  105-115g  Fluid:  2.1L/day   Clayton Bibles, MS, RD, LDN Inpatient Clinical Dietitian Contact information available via Amion

## 2020-01-08 NOTE — Progress Notes (Addendum)
Patient seen and examined, admitted earlier this morning by Dr. 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 Drucilla Schmidt of infectious disease and Dr. Louanne Skye of orthopedics. -He has been on antibiotics off and on since July 2020 for osteomyelitis, completed his second prolonged course of IV ceftriaxone in April followed by 3 months of doxycycline which he remains on at this time. -Presents to the ED with worsening nausea, intermittent episodes of vomiting, severe anorexia and 35 pound weight loss over the last 2 months.  Work-up in the ED was unremarkable, he had a CT scan done by his PCP on 6/10 which indicated mild biliary ductal dilation with gallstones.  Nausea, severe anorexia and profound weight loss -I suspect this could be secondary to prolonged course of antibiotics namely doxycycline which can induce pill esophagitis -In addition also on hydrocodone and Lyrica which may be contributing to his nausea and NSAIDs which can cause PUD -Add IV PPI -LFTs are normal, MRCP ordered by admitting MD given dilated bile ducts and gallstones noted on outpatient CT abdomen from 6/10 -Will request [facility] gastroenterology input, may benefit from an endoscopy  Type 2 diabetes mellitus -Oral hypoglycemics on hold, continue sliding scale insulin  Chronic lumbar and thoracic osteomyelitis/discitis -Completed second 6-week course of IV ceftriaxone in April, currently remains on oral doxycycline -Followed by Dr. Drucilla Schmidt and Louanne Skye  BPH -Continue Flomax  Hypertension -Continue lisinopril  Domenic Polite, MD

## 2020-01-08 NOTE — Progress Notes (Signed)
Per MRI patient needs to be NPO

## 2020-01-08 NOTE — TOC Progression Note (Addendum)
Transition of Care [facility]) - Progression Note    Patient Details  Name: Luis Brown MRN: [id number] Date of Birth: Aug 28, 1945  Transition of [facility] CM/SW Contact  Leeroy Cha, RN Phone Number: 01/08/2020, 2:00 PM  Clinical Narrative:    [facility] for pt rn and aide. [facility] declined patient.  Sent to [facility].  Expected Discharge Plan: [place] Barriers to Discharge: Continued Medical Work up  Expected Discharge Plan and Services Expected Discharge Plan: [employer]   Discharge Planning Services: CM Consult Post Acute Care Choice: [place] arrangements for the past 2 months: Single Family Home                           HH Arranged: RN, PT, Nurse's Aide [place] Agency: [facility] [employer] [facility] [employer] Date [place]: 01/08/20 Time [place]: 1400 Representative spoke with at [place]: mike   Social Determinants of Health ([place]) Interventions    Readmission Risk Interventions No flowsheet data found.

## 2020-01-08 NOTE — H&P (Signed)
History and Physical    Luis Brown HWT:[id number] DOB: October 22, 1945 DOA: 01/07/2020  PCP: Mackie Pai, PA-C  Patient coming from: Home.  Chief Complaint: Nausea and weight loss.  HPI: Luis Brown is a 74 y.o. male with history of diabetes mellitus type 2 hypertension hyperlipidemia BPH who has been treated for chronic osteomyelitis involving the T-spine L-spine on doxycycline has been having increasing nausea over the last couple of months with at least 35 pound weight loss.  Denies any diarrhea fever chills there has been a back pain from the osteomyelitis.  Being followed by Dr. Drucilla Schmidt.  Given the ongoing weight loss with nausea patient was advised to come to the ER.  ED Course: In the ER abdomen appears benign.  EKG shows sinus bradycardia heart rate is around 57 bpm LFTs were largely unremarkable CBC unremarkable CRP 0.5 metabolic panel shows sodium of 129 Covid test negative.  Patient had a CT abdomen pelvis done on December 26, 2019 which shows ductal dilatation with gallstones.  At that time radiologist has recommended an MRCP.  Patient also had a CT angiogram of the chest on June 9 which was negative for pulmonary embolism.  Patient admitted for further management of nausea with weight loss and abnormal CT scan showing biliary ductal dilatation.  Review of Systems: As per HPI, rest all negative.   Past Medical History:  Diagnosis Date  . Acute pharyngitis 10/21/2013  . Allergy    grass and pollen  . Anxiety and depression 10/25/2011  . BPH (benign prostatic hyperplasia) 04/23/2012  . Chicken pox as a child  . DDD (degenerative disc disease)    cervical responds to steroid injections and low back required surgery  . DDD (degenerative disc disease), lumbosacral   . Diabetes mellitus    pre  . Dyspnea   . ED (erectile dysfunction) 04/23/2012  . Elevated BP   . 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 Esophageal reflux 02/10/2015  . Fatigue   . HTN (hypertension)   . Hyperglycemia     preDM   . Hyperlipidemia   . Insomnia   . Low back pain radiating to both legs 01/16/2017  . Measles as a child  . Medicare annual wellness visit, subsequent 02/10/2015  . Overweight(278.02)   . Peripheral neuropathy 08/07/2019  . Personal history of colonic polyps 10/27/2012   Follows with [facility]  . Pneumonia    " walking"  . Preventative health care   . 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  . Wears glasses     Past Surgical History:  Procedure Laterality Date  . BACK SURGERY  2012 and 1994   Dr Margret Chance, screws and cage in low back  . EYE SURGERY Bilateral    2016  . HARDWARE REVISION  03/12/2019   REVISION OF HARDWARE THORACIC TEN-THORACIC ELEVEN WITH APPLICATION OF ADDITIONAL RODS AND ROD SLEEVES THORACIC EIGHT-THORACIC TWELVE 12-LUMBAR ONE (N/A )  . LAMINECTOMY  02/12/2019   DECOMPRESSIVE LAMINECTOMY THORACIC NINE-THORACIC TEN AND THORACIC TEN-THORACIC ELEVEN, EXTENSION OF THORACOLUMBAR FUSION FROM THORACIC TEN TO THORACIC FIVE, LOCAL BONE GRAFT, ALLOGRAFT AND VIVIGEN (N/A)  . SPINAL FUSION N/A 02/12/2019   Procedure: DECOMPRESSIVE LAMINECTOMY THORACIC NINE-THORACIC TEN AND THORACIC TEN-THORACIC ELEVEN, EXTENSION OF THORACOLUMBAR FUSION FROM THORACIC TEN TO THORACIC FIVE, LOCAL BONE GRAFT, ALLOGRAFT AND VIVIGEN;  Surgeon: Jessy Oto, MD;  Location: [facility];  Service: Orthopedics;  Laterality: N/A;  DECOMPRESSIVE LAMINECTOMY THORACIC NINE-THORACIC TEN AND THORACIC TEN-THORACIC ELEVEN, EXTENSION OF TH  . SPINAL FUSION N/A 03/12/2019  Procedure: REVISION OF HARDWARE THORACIC TEN-THORACIC ELEVEN WITH APPLICATION OF ADDITIONAL RODS AND ROD SLEEVES THORACIC EIGHT-THORACIC TWELVE 12-LUMBAR ONE;  Surgeon: Jessy Oto, MD;  Location: [facility];  Service: Orthopedics;  Laterality: N/A;  . TONSILLECTOMY    . TONSILLECTOMY     as child  . torn rotator cuff  2010   right     reports that he quit smoking about 30 years ago. His smoking use included cigarettes. He has a 20.00  pack-year smoking history. He has never used smokeless tobacco. He reports current alcohol use. He reports that he does not use drugs.  No Known Allergies  Family History  Problem Relation Age of Onset  . Hypertension Mother   . Diabetes Mother        type 2  . Cancer Mother 74       breast in remission  . Emphysema Father        smoker  . COPD Father        smoker  . Stroke Father 4       mini  . Heart disease Father   . Hypertension Sister   . Hyperlipidemia Sister   . Scoliosis Sister   . Osteoporosis Sister   . Hypertension Maternal Grandmother   . Scoliosis Maternal Grandmother   . Heart disease Maternal Grandfather   . Heart disease Paternal Grandfather        smoker  . Heart disease Daughter     Prior to Admission medications   Medication Sig Start Date End Date Taking? Authorizing Provider  atorvastatin (LIPITOR) 20 MG tablet TAKE 1/2 TABLET BY MOUTH DAILY AT 6 PM Patient taking differently: Take 10 mg by mouth daily.  05/02/19  Yes Saguier, Percell Miller, PA-C  busPIRone (BUSPAR) 7.5 MG tablet 1-2 TAB BY MOUTH 3 TIMES A DAY FOR ANXIETY Patient taking differently: Take 7.5 mg by mouth 3 (three) times daily.  12/17/19  Yes Saguier, Percell Miller, PA-C  cetirizine (ZYRTEC) 10 MG tablet Take 10 mg by mouth daily. In the morning   Yes [provider]  diazepam (VALIUM) 10 MG tablet TAKE 1 TABLET(10 MG) BY MOUTH AT BEDTIME AS NEEDED Patient taking differently: Take 5 mg by mouth at bedtime.  10/28/19  Yes Saguier, Percell Miller, PA-C  doxycycline (VIBRA-TABS) 100 MG tablet Take 1 tablet (100 mg total) by mouth 2 (two) times daily. 12/31/19  Yes Tommy Medal, Lavell Islam, MD  famotidine (PEPCID) 40 MG tablet TAKE 1 TABLET BY MOUTH EVERYDAY AT BEDTIME Patient taking differently: Take 40 mg by mouth at bedtime.  09/07/19  Yes Saguier, Percell Miller, PA-C  FERRETTS 325 (106 Fe) MG TABS tablet TAKE 1 TABLET (106 MG OF IRON TOTAL) BY MOUTH DAILY. Patient taking differently: Take 1 tablet by mouth daily.   12/30/19  Yes Saguier, Percell Miller, PA-C  HYDROcodone-acetaminophen (NORCO) 10-325 MG tablet Take 1 tablet by mouth every 6 (six) hours as needed. Patient taking differently: Take 1 tablet by mouth every 6 (six) hours as needed for moderate pain or severe pain.  01/06/20  Yes Jessy Oto, MD  ibuprofen (ADVIL) 600 MG tablet TAKE 1 TABLET (600 MG TOTAL) BY MOUTH EVERY 8 (EIGHT) HOURS AS NEEDED. Patient taking differently: Take 600 mg by mouth every 8 (eight) hours as needed for moderate pain.  12/23/19  Yes Jessy Oto, MD  lisinopril (ZESTRIL) 5 MG tablet TAKE 1 TABLET BY MOUTH TWICE A DAY Patient taking differently: Take 2.5 mg by mouth daily.  11/21/19  Yes Letona,  Percell Miller, PA-C  metFORMIN (GLUCOPHAGE) 1000 MG tablet TAKE 1 TABLET (1,000 MG TOTAL) BY MOUTH 2 (TWO) TIMES DAILY WITH A MEAL. 12/19/19  Yes Saguier, Percell Miller, PA-C  ondansetron (ZOFRAN) 4 MG tablet Take 4 mg by mouth every 8 (eight) hours as needed for nausea or vomiting.  01/06/20  Yes [provider]  pregabalin (LYRICA) 75 MG capsule Take 1 capsule (75 mg total) by mouth 2 (two) times daily. 11/14/19  Yes Jessy Oto, MD  sitaGLIPtin (JANUVIA) 50 MG tablet Take 1 tablet (50 mg total) by mouth daily. 11/25/19  Yes Saguier, Percell Miller, PA-C  tamsulosin (FLOMAX) 0.4 MG CAPS capsule Take 2 capsules (0.8 mg total) by mouth daily. 11/18/19  Yes Saguier, Percell Miller, PA-C  tiZANidine (ZANAFLEX) 4 MG tablet Take 1.5 tablets (6 mg total) by mouth every 6 (six) hours as needed for muscle spasms. 12/20/19  Yes Jessy Oto, MD  venlafaxine XR (EFFEXOR-XR) 150 MG 24 hr capsule Take 1 capsule (150 mg total) by mouth daily with breakfast. 11/06/19  Yes Saguier, Percell Miller, PA-C  glucose blood (CONTOUR NEXT TEST) test strip Check blood sugar once daily 02/25/19   Saguier, Percell Miller, PA-C  ondansetron (ZOFRAN ODT) 4 MG disintegrating tablet Take 1 tablet (4 mg total) by mouth every 8 (eight) hours as needed for nausea or vomiting. Patient not taking: Reported on  01/07/2020 12/25/19   Gareth Morgan, MD  testosterone cypionate (DEPOTESTOSTERONE CYPIONATE) 200 MG/ML injection Inject 1 mL (200 mg total) into the muscle every 14 (fourteen) days. Patient not taking: Reported on 01/07/2020 11/17/19   Mackie Pai, PA-C    Physical Exam: Constitutional: Moderately built and nourished. Vitals:   01/07/20 1738 01/07/20 1800 01/07/20 1930 01/07/20 2130  BP: (!) 101/57 (!) 109/58 140/64 (!) 152/68  Pulse: (!) 56 (!) 57 65 62  Resp: 14 15 16 16   Temp:   98.3 F (36.8 C) 98.2 F (36.8 C)  TempSrc:   Oral Oral  SpO2: 97% 98% 100% 98%  Weight:      Height:       Eyes: Anicteric no pallor. ENMT: No discharge from the ears eyes nose or mouth. Neck: No mass felt.  No neck rigidity. Respiratory: No rhonchi or crepitations. Cardiovascular: S1-S2 heard. Abdomen: Soft nontender bowel sounds present. Musculoskeletal: No edema. Skin: No rash. Neurologic: Alert awake oriented to time place and person.  Moves all extremities. Psychiatric: Appears normal.   Labs on Admission: I have personally reviewed following labs and imaging studies  CBC: Recent Labs  Lab 01/07/20 1259  WBC 8.4  NEUTROABS 7.1  HGB 14.4  HCT 43.0  MCV 90.9  PLT 161   Basic Metabolic Panel: Recent Labs  Lab 01/07/20 1259  NA 129*  K 3.8  CL 92*  CO2 25  GLUCOSE 147*  BUN 12  CREATININE 0.76  CALCIUM 9.1   GFR: Estimated Creatinine Clearance: 80.1 mL/min (by C-G formula based on SCr of 0.76 mg/dL). Liver Function Tests: Recent Labs  Lab 01/07/20 1259  AST 24  ALT 13  ALKPHOS 80  BILITOT 1.3*  PROT 7.5  ALBUMIN 4.7   Recent Labs  Lab 01/07/20 1259  LIPASE 15   No results for input(s): AMMONIA in the last 168 hours. Coagulation Profile: No results for input(s): INR, PROTIME in the last 168 hours. Cardiac Enzymes: No results for input(s): CKTOTAL, CKMB, CKMBINDEX, TROPONINI in the last 168 hours. BNP (last 3 results) No results for input(s): PROBNP in the  last 8760 hours. HbA1C: No results  for input(s): HGBA1C in the last 72 hours. CBG: No results for input(s): GLUCAP in the last 168 hours. Lipid Profile: No results for input(s): CHOL, HDL, LDLCALC, TRIG, CHOLHDL, LDLDIRECT in the last 72 hours. Thyroid Function Tests: No results for input(s): TSH, T4TOTAL, FREET4, T3FREE, THYROIDAB in the last 72 hours. Anemia Panel: No results for input(s): VITAMINB12, FOLATE, FERRITIN, TIBC, IRON, RETICCTPCT in the last 72 hours. Urine analysis:    Component Value Date/Time   COLORURINE YELLOW 01/07/2020 1250   APPEARANCEUR CLEAR 01/07/2020 1250   LABSPEC 1.015 01/07/2020 1250   PHURINE 6.5 01/07/2020 1250   GLUCOSEU NEGATIVE 01/07/2020 1250   HGBUR NEGATIVE 01/07/2020 1250   BILIRUBINUR NEGATIVE 01/07/2020 1250   BILIRUBINUR negative 10/08/2019 1430   KETONESUR 40 (A) 01/07/2020 1250   PROTEINUR NEGATIVE 01/07/2020 1250   UROBILINOGEN negative (A) 10/08/2019 1430   UROBILINOGEN 0.2 04/08/2011 0956   NITRITE NEGATIVE 01/07/2020 1250   LEUKOCYTESUR NEGATIVE 01/07/2020 1250   Sepsis Labs: @LABRCNTIP (procalcitonin:4,lacticidven:4) ) Recent Results (from the past 240 hour(s))  SARS Coronavirus 2 by RT PCR (hospital order, performed in [facility] hospital lab) Nasopharyngeal Nasopharyngeal Swab     Status: None   Collection Time: 01/07/20  4:11 PM   Specimen: Nasopharyngeal Swab  Result Value Ref Range Status   SARS Coronavirus 2 NEGATIVE NEGATIVE Final    Comment: (NOTE) SARS-CoV-2 target nucleic acids are NOT DETECTED.  The SARS-CoV-2 RNA is generally detectable in upper and lower respiratory specimens during the acute phase of infection. The lowest concentration of SARS-CoV-2 viral copies this assay can detect is 250 copies / mL. A negative result does not preclude SARS-CoV-2 infection and should not be used as the sole basis for treatment or other patient management decisions.  A negative result may occur with improper specimen  collection / handling, submission of specimen other than nasopharyngeal swab, presence of viral mutation(s) within the areas targeted by this assay, and inadequate number of viral copies (<250 copies / mL). A negative result must be combined with clinical observations, patient history, and epidemiological information.  Fact Sheet for Patients:   StrictlyIdeas.no  Fact Sheet for Healthcare Providers: BankingDealers.co.za  This test is not yet approved or  cleared by the [place] FDA and has been authorized for detection and/or diagnosis of SARS-CoV-2 by FDA under an Emergency Use Authorization (EUA).  This EUA will remain in effect (meaning this test can be used) for the duration of the COVID-19 declaration under Section 564(b)(1) of the Act, 21 U.S.C. section 360bbb-3(b)(1), unless the authorization is terminated or revoked sooner.  Performed at [facility], [address] [place], [place] [address]      Radiological Exams on Admission: DG Chest Portable 1 View  Result Date: 01/07/2020 CLINICAL DATA:  Weight loss. EXAM: PORTABLE CHEST 1 VIEW COMPARISON:  02/08/2019. FINDINGS: Mediastinum and hilar structures normal. Heart size normal. No focal infiltrate. No pleural effusion or pneumothorax. Prior thoracic spine fusion. IMPRESSION: No acute cardiopulmonary disease. Electronically Signed   By: Marcello Moores  Register   On: 01/07/2020 13:03    EKG: Independently reviewed.  Sinus bradycardia heart rate is around 57 bpm.  Assessment/Plan Principal Problem:   Weight loss Active Problems:   Diabetes mellitus without complication (HCC)   BPH (benign prostatic hyperplasia)   Nausea and vomiting    1. Weight loss with nausea with abnormal CT scan and I with abdomen pelvis showing biliary ductal dilatation at the time radiology has recommended MRCP with contrast which I  have ordered.  Follow LFTs.  May need GI consult.  Patient  states he was already scheduled to have colonoscopy done next month by Dr. Michail Sermon. 2. Diabetes mellitus type 2 we will keep patient on sliding scale coverage. 3. Hypertension on lisinopril. 4. Hyperlipidemia on statins. 5. BPH on both Flomax. 6. Chronic pain on hydrocodone. 7. Discitis on doxycycline.   DVT prophylaxis: SCDs for now until we get the results from MRCP. Code Status: Full code. Family Communication: Discussed with patient. Disposition Plan: Home. Consults called: None. Admission status: Inpatient.   Rise Patience MD [facility] Pager [phone].  If 7PM-7AM, please contact night-coverage [email or web address] Password [facility]  01/08/2020, 12:24 AM

## 2020-01-08 NOTE — Consult Note (Addendum)
Referring Provider:  [facility]   Primary Care Physician:  Mackie Pai, PA-C Primary Gastroenterologist:  Dr. Michail Sermon  Reason for Consultation: Nausea, diffuse abdominal pain, weight loss, biliary dilatation  HPI: Luis Brown is a 74 y.o. male admitted through the hospital yesterday because of persistent food intolerance and progressive weight loss and weakness.  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  Most recently, he has been on the doxycycline, but he says he does not want to take it anymore because it "tears his stomach up."  The patient has no prior history of ulcer disease or upper GI problems, but for the past 3 months, he has had a weight loss totaling about 36 pounds, and for the past several weeks, recurrent vomiting when he would try to eat, which has led him to avoid food.  About a week ago, he was started on ondansetron which has controlled his vomiting quite well, to the point where he has actually started to consume some applesauce.  He has not vomited recently.  He also has rather diffuse constant mid abdominal discomfort which waxes and wanes in severity.  Of note, the patient has been taking ibuprofen 600 mg 3 times a day for months, is also on iron once daily and hydrocodone every 6 hours, all of which could lead to dyspepsia or nausea.  Concerning ulcer prophylaxis, the patient has been on famotidine 40 mg nightly, but has not been on PPI therapy.  He has been having a lot of "indigestion" and has been using Rolaids and Pepto-Bismol.  There has been no significant lower GI tract symptomatology.  He previously was having some diarrhea associated with antibiotics, more recently, has been slightly on the constipated side.  Colonoscopy in February 2014 by Dr. Michail Sermon showed a 5 mm sessile serrated polyp.  A CT  scan ordered through his primary physician's office about 2 weeks ago showed possible tiny gallstones and diffuse biliary ductal dilatation, although no pancreatic mass was evident and liver chemistries are normal.  An MRI is scheduled for today to evaluate the biliary system further.  The patient was seen in Dr. Kathline Magic office 2 days ago, at which time plans were made for endoscopy and colonoscopy to be done in late July.  However, because of ongoing symptoms, weakness, and progressive weight loss, the patient presented to the emergency room (for the third time) yesterday and the decision was made to admit the patient, who admits to feeling depressed and despondent.   Past Medical History:  Diagnosis Date  . Acute pharyngitis 10/21/2013  . Allergy    grass and pollen  . Anxiety and depression 10/25/2011  . BPH (benign prostatic hyperplasia) 04/23/2012  . Chicken pox as a child  . DDD (degenerative disc disease)    cervical responds to steroid injections and low back required surgery  . DDD (degenerative disc disease), lumbosacral   . Diabetes mellitus    pre  . Dyspnea   . ED (erectile dysfunction) 04/23/2012  . Elevated BP   . 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 Esophageal reflux 02/10/2015  . Fatigue   . HTN (hypertension)   . Hyperglycemia    preDM   . Hyperlipidemia   . Insomnia   . Low back pain radiating to both legs 01/16/2017  . Measles as a child  . Medicare annual wellness visit, subsequent  02/10/2015  . Overweight(278.02)   . Peripheral neuropathy 08/07/2019  . Personal history of colonic polyps 10/27/2012   Follows with [facility]  . Pneumonia    " walking"  . Preventative health care   . 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  . Wears glasses     Past Surgical History:  Procedure Laterality Date  . BACK SURGERY  2012 and 1994   Dr Margret Chance, screws and cage in low back  . EYE SURGERY Bilateral    2016  . HARDWARE REVISION  03/12/2019   REVISION OF HARDWARE  THORACIC TEN-THORACIC ELEVEN WITH APPLICATION OF ADDITIONAL RODS AND ROD SLEEVES THORACIC EIGHT-THORACIC TWELVE 12-LUMBAR ONE (N/A )  . LAMINECTOMY  02/12/2019   DECOMPRESSIVE LAMINECTOMY THORACIC NINE-THORACIC TEN AND THORACIC TEN-THORACIC ELEVEN, EXTENSION OF THORACOLUMBAR FUSION FROM THORACIC TEN TO THORACIC FIVE, LOCAL BONE GRAFT, ALLOGRAFT AND VIVIGEN (N/A)  . SPINAL FUSION N/A 02/12/2019   Procedure: DECOMPRESSIVE LAMINECTOMY THORACIC NINE-THORACIC TEN AND THORACIC TEN-THORACIC ELEVEN, EXTENSION OF THORACOLUMBAR FUSION FROM THORACIC TEN TO THORACIC FIVE, LOCAL BONE GRAFT, ALLOGRAFT AND VIVIGEN;  Surgeon: Jessy Oto, MD;  Location: [place];  Service: Orthopedics;  Laterality: N/A;  DECOMPRESSIVE LAMINECTOMY THORACIC NINE-THORACIC TEN AND THORACIC TEN-THORACIC ELEVEN, EXTENSION OF TH  . SPINAL FUSION N/A 03/12/2019   Procedure: REVISION OF HARDWARE THORACIC TEN-THORACIC ELEVEN WITH APPLICATION OF ADDITIONAL RODS AND ROD SLEEVES THORACIC EIGHT-THORACIC TWELVE 12-LUMBAR ONE;  Surgeon: Jessy Oto, MD;  Location: [place];  Service: Orthopedics;  Laterality: N/A;  . TONSILLECTOMY    . TONSILLECTOMY     as child  . torn rotator cuff  2010   right    Prior to Admission medications   Medication Sig Start Date End Date Taking? Authorizing Provider  atorvastatin (LIPITOR) 20 MG tablet TAKE 1/2 TABLET BY MOUTH DAILY AT 6 PM Patient taking differently: Take 10 mg by mouth daily.  05/02/19  Yes Saguier, Percell Miller, PA-C  busPIRone (BUSPAR) 7.5 MG tablet 1-2 TAB BY MOUTH 3 TIMES A DAY FOR ANXIETY Patient taking differently: Take 7.5 mg by mouth 3 (three) times daily.  12/17/19  Yes Saguier, Percell Miller, PA-C  cetirizine (ZYRTEC) 10 MG tablet Take 10 mg by mouth daily. In the morning   Yes [provider]  diazepam (VALIUM) 10 MG tablet TAKE 1 TABLET(10 MG) BY MOUTH AT BEDTIME AS NEEDED Patient taking differently: Take 5 mg by mouth at bedtime.  10/28/19  Yes Saguier, Percell Miller, PA-C  doxycycline  (VIBRA-TABS) 100 MG tablet Take 1 tablet (100 mg total) by mouth 2 (two) times daily. 12/31/19  Yes Tommy Medal, Lavell Islam, MD  famotidine (PEPCID) 40 MG tablet TAKE 1 TABLET BY MOUTH EVERYDAY AT BEDTIME Patient taking differently: Take 40 mg by mouth at bedtime.  09/07/19  Yes Saguier, Percell Miller, PA-C  FERRETTS 325 (106 Fe) MG TABS tablet TAKE 1 TABLET (106 MG OF IRON TOTAL) BY MOUTH DAILY. Patient taking differently: Take 1 tablet by mouth daily.  12/30/19  Yes Saguier, Percell Miller, PA-C  HYDROcodone-acetaminophen (NORCO) 10-325 MG tablet Take 1 tablet by mouth every 6 (six) hours as needed. Patient taking differently: Take 1 tablet by mouth every 6 (six) hours as needed for moderate pain or severe pain.  01/06/20  Yes Jessy Oto, MD  ibuprofen (ADVIL) 600 MG tablet TAKE 1 TABLET (600 MG TOTAL) BY MOUTH EVERY 8 (EIGHT) HOURS AS NEEDED. Patient taking differently: Take 600 mg by mouth every 8 (eight) hours as needed for moderate pain.  12/23/19  Yes Jessy Oto, MD  lisinopril (ZESTRIL) 5 MG tablet TAKE 1 TABLET BY MOUTH TWICE A DAY Patient taking differently: Take 2.5 mg by mouth daily.  11/21/19  Yes Saguier, Percell Miller, PA-C  metFORMIN (GLUCOPHAGE) 1000 MG tablet TAKE 1 TABLET (1,000 MG TOTAL) BY MOUTH 2 (TWO) TIMES DAILY WITH A MEAL. 12/19/19  Yes Saguier, Percell Miller, PA-C  ondansetron (ZOFRAN) 4 MG tablet Take 4 mg by mouth every 8 (eight) hours as needed for nausea or vomiting.  01/06/20  Yes [provider]  pregabalin (LYRICA) 75 MG capsule Take 1 capsule (75 mg total) by mouth 2 (two) times daily. 11/14/19  Yes Jessy Oto, MD  sitaGLIPtin (JANUVIA) 50 MG tablet Take 1 tablet (50 mg total) by mouth daily. 11/25/19  Yes Saguier, Percell Miller, PA-C  tamsulosin (FLOMAX) 0.4 MG CAPS capsule Take 2 capsules (0.8 mg total) by mouth daily. 11/18/19  Yes Saguier, Percell Miller, PA-C  tiZANidine (ZANAFLEX) 4 MG tablet Take 1.5 tablets (6 mg total) by mouth every 6 (six) hours as needed for muscle spasms. 12/20/19  Yes Jessy Oto, MD  venlafaxine XR (EFFEXOR-XR) 150 MG 24 hr capsule Take 1 capsule (150 mg total) by mouth daily with breakfast. 11/06/19  Yes Saguier, Percell Miller, PA-C  glucose blood (CONTOUR NEXT TEST) test strip Check blood sugar once daily 02/25/19   Saguier, Percell Miller, PA-C  ondansetron (ZOFRAN ODT) 4 MG disintegrating tablet Take 1 tablet (4 mg total) by mouth every 8 (eight) hours as needed for nausea or vomiting. Patient not taking: Reported on 01/07/2020 12/25/19   Gareth Morgan, MD  testosterone cypionate (DEPOTESTOSTERONE CYPIONATE) 200 MG/ML injection Inject 1 mL (200 mg total) into the muscle every 14 (fourteen) days. Patient not taking: Reported on 01/07/2020 11/17/19   Saguier, Percell Miller, PA-C    Current Facility-Administered Medications  Medication Dose Route Frequency Provider Last Rate Last Admin  . atorvastatin (LIPITOR) tablet 10 mg  10 mg Oral Daily Rise Patience, MD      . busPIRone (BUSPAR) tablet 7.5 mg  7.5 mg Oral TID Rise Patience, MD      . diazepam (VALIUM) tablet 5 mg  5 mg Oral QHS Rise Patience, MD      . doxycycline (VIBRA-TABS) tablet 100 mg  100 mg Oral BID Rise Patience, MD   100 mg at 01/08/20 0501  . feeding supplement (ENSURE ENLIVE) (ENSURE ENLIVE) liquid 237 mL  237 mL Oral BID BM Rise Patience, MD      . ferrous fumarate (HEMOCYTE - 106 mg FE) tablet 106 mg of iron  1 tablet Oral Daily Rise Patience, MD      . HYDROcodone-acetaminophen [facility]) 10-325 MG per tablet 1 tablet  1 tablet Oral Q6H PRN Rise Patience, MD   1 tablet at 01/08/20 0908  . insulin aspart (novoLOG) injection 0-9 Units  0-9 Units Subcutaneous TID WC Rise Patience, MD      . lisinopril (ZESTRIL) tablet 2.5 mg  2.5 mg Oral Daily Rise Patience, MD      . ondansetron [facility]) tablet 4 mg  4 mg Oral Q6H PRN Rise Patience, MD       Or  . ondansetron [facility]) injection 4 mg  4 mg Intravenous Q6H PRN Rise Patience, MD   4 mg at 01/08/20  0911  . pantoprazole (PROTONIX) injection 40 mg  40 mg Intravenous Q12H Domenic Polite, MD      . pregabalin (LYRICA) capsule 75 mg  75 mg Oral BID Hal Hope,  Doreatha Lew, MD      . tamsulosin (FLOMAX) capsule 0.8 mg  0.8 mg Oral Daily Rise Patience, MD      . tiZANidine (ZANAFLEX) tablet 6 mg  6 mg Oral Q6H PRN Rise Patience, MD      . venlafaxine XR (EFFEXOR-XR) 24 hr capsule 150 mg  150 mg Oral Q breakfast Rise Patience, MD        Allergies as of 01/07/2020  . (No Known Allergies)    Family History  Problem Relation Age of Onset  . Hypertension Mother   . Diabetes Mother        type 2  . Cancer Mother 38       breast in remission  . Emphysema Father        smoker  . COPD Father        smoker  . Stroke Father 36       mini  . Heart disease Father   . Hypertension Sister   . Hyperlipidemia Sister   . Scoliosis Sister   . Osteoporosis Sister   . Hypertension Maternal Grandmother   . Scoliosis Maternal Grandmother   . Heart disease Maternal Grandfather   . Heart disease Paternal Grandfather        smoker  . Heart disease Daughter     Social History   Socioeconomic History  . Marital status: Married    Spouse name: Not on file  . Number of children: Not on file  . Years of education: Not on file  . Highest education level: Not on file  Occupational History  . Not on file  Tobacco Use  . Smoking status: Former Smoker    Packs/day: 1.00    Years: 20.00    Pack years: 20.00    Types: Cigarettes    Quit date: 07/18/1989    Years since quitting: 30.4  . Smokeless tobacco: Never Used  . Tobacco comment: off and on smoking for the 20 yrs  Vaping Use  . Vaping Use: Never used  Substance and Sexual Activity  . Alcohol use: Yes    Comment: drink or two a day- mixed drink  . Drug use: No  . Sexual activity: Yes    Comment: lives with wife, still working, no dietary restrictions, continues to exercise intermittently  Other Topics Concern  . Not  on file  Social History Narrative  . Not on file   Social Determinants of Health   Financial Resource Strain: Low Risk   . Difficulty of Paying Living Expenses: Not hard at all  Food Insecurity:   . Worried About Charity fundraiser in the Last Year:   . Arboriculturist in the Last Year:   Transportation Needs: No Transportation Needs  . Lack of Transportation (Medical): No  . Lack of Transportation (Non-Medical): No  Physical Activity:   . Days of Exercise per Week:   . Minutes of Exercise per Session:   Stress:   . Feeling of Stress :   Social Connections:   . Frequency of Communication with Friends and Family:   . Frequency of Social Gatherings with Friends and Family:   . Attends Religious Services:   . Active Member of Clubs or Organizations:   . Attends Archivist Meetings:   Marland Kitchen Marital Status:   Intimate Partner Violence:   . Fear of Current or Ex-Partner:   . Emotionally Abused:   Marland Kitchen Physically Abused:   . Sexually  Abused:     Review of Systems: The patient is having shoulder spasms and back pain which sometimes radiates around to his chest, but he does not have chest pain as such.  He does have some intermittent exertional dyspnea which is probably due to deconditioning, since he has been spending more and more time in bed in recent weeks.  He is ambulatory and does get up and around to some degree.  Physical Exam: Vital signs in last 24 hours: Temp:  [97.4 F (36.3 C)-98.3 F (36.8 C)] 97.4 F (36.3 C) (06/23 0624) Pulse Rate:  [55-83] 62 (06/23 0624) Resp:  [14-20] 18 (06/23 0624) BP: (91-156)/(53-89) 144/63 (06/23 0624) SpO2:  [97 %-100 %] 98 % (06/23 0624) Weight:  [71.2 kg-73.8 kg] 72.1 kg (06/22 1214) Last BM Date: 01/08/20 General:   Alert,  Well-developed, fairly well-nourished but thin, not frankly cachectic; pleasant and cooperative in NAD Head:  Normocephalic and atraumatic. Eyes:  Sclera clear, no icterus.   Conjunctiva pink. Mouth:   No  ulcerations or lesions.  Oropharynx pink & moist. Lungs:  Clear anteriorly to auscultation.   No wheezes, crackles, or rhonchi. No evident respiratory distress. Heart:   Regular rate and rhythm; no murmurs, clicks, rubs,  or gallops. Abdomen:  Soft, nontender, scaphoid. No masses, hepatosplenomegaly or ventral hernias noted.   Msk:   Symmetrical without gross deformities. Pulses:  Normal radial pulse is noted. Extremities:   Without clubbing, cyanosis, or edema. Neurologic:  Alert and coherent;  grossly normal neurologically. Skin:  Intact without significant lesions or rashes. Psych:   Alert and cooperative.  The patient appears mildly depressed and expresses some concerns of hopelessness, that he "will not get better."  This seems, however, quite appropriate given how the past year has gone for him and the persistent, progressive character of his symptoms.  Intake/Output from previous day: 06/22 0701 - 06/23 0700 In: 1128.1 [P.O.:120; IV Piggyback:1008.1] Out: 200 [Urine:200] Intake/Output this shift: No intake/output data recorded.  Lab Results: Recent Labs    01/07/20 1259 01/08/20 0544  WBC 8.4 7.3  HGB 14.4 13.5  HCT 43.0 40.1  PLT 318 286   BMET Recent Labs    01/07/20 1259 01/08/20 0544  NA 129* 134*  K 3.8 4.0  CL 92* 98  CO2 25 26  GLUCOSE 147* 117*  BUN 12 11  CREATININE 0.76 0.74  CALCIUM 9.1 8.9   LFT Recent Labs    01/08/20 0544  PROT 6.8  ALBUMIN 4.3  AST 20  ALT 14  ALKPHOS 72  BILITOT 1.5*  BILIDIR 0.2  IBILI 1.3*   PT/INR No results for input(s): LABPROT, INR in the last 72 hours.  Studies/Results: DG Chest Portable 1 View  Result Date: 01/07/2020 CLINICAL DATA:  Weight loss. EXAM: PORTABLE CHEST 1 VIEW COMPARISON:  02/08/2019. FINDINGS: Mediastinum and hilar structures normal. Heart size normal. No focal infiltrate. No pleural effusion or pneumothorax. Prior thoracic spine fusion. IMPRESSION: No acute cardiopulmonary disease.  Electronically Signed   By: Marcello Moores  Register   On: 01/07/2020 13:03    Impression: 1.  Upper tract symptoms including nausea, vomiting, and food intolerance, improved over the past week since starting ondansetron 2.  Long-term exposure to high-dose ibuprofen taken on an empty stomach, therefore, patient at risk for ulcer disease; also, taking other nauseating medications including iron and hydrocodone. 3.  Severe weight loss, now totaling 36 pounds, with associated progressive weakness and decrease in functional status 4.  Possible cholelithiasis by CT, with  dilated biliary system of unclear cause (no obvious pancreatic mass), normal liver chemistries 5.  History of solitary diminutive sessile serrated polyp removed colonoscopically 7 years ago  Plan: 1.  Agree with plan for MRI/MRCP today 2.  Upper endoscopy tomorrow, to look for ulcer disease or other source of nausea, food avoidance, and weight loss 3.  Colonoscopy concurrently with tomorrow's endoscopy, for surveillance of prior polyp as well as evaluation of weight loss 4.  The nature, purpose and risks of endoscopic evaluation were reviewed with the patient and his wife at the bedside, as well as a review of colonoscopy which he has had on prior occasions. 5.  Depending on the findings of today's MRCP and tomorrow's EGD, consider checking a CA 19-9 level because of the patient's weight loss 6. In the meantime, agree w/ IV pantoprazole   LOS: 0 days   Youlanda Mighty Rica Heather  01/08/2020, 11:16 AM   Pager [phone] If no answer or after 5 PM call [phone]

## 2020-01-09 ENCOUNTER — Inpatient Hospital Stay (HOSPITAL_COMMUNITY): Payer: Medicare Other | Admitting: Certified Registered"

## 2020-01-09 ENCOUNTER — Other Ambulatory Visit: Payer: Medicare Other

## 2020-01-09 ENCOUNTER — Encounter (HOSPITAL_COMMUNITY)
Admission: AD | Disposition: A | Discharge status: Home Health | Payer: Self-pay | Source: Home / Self Care | Attending: Internal Medicine

## 2020-01-09 DIAGNOSIS — E43 Unspecified severe protein-calorie malnutrition: Secondary | ICD-10-CM | POA: Insufficient documentation

## 2020-01-09 HISTORY — PX: POLYPECTOMY: SHX5525

## 2020-01-09 HISTORY — PX: COLONOSCOPY WITH PROPOFOL: SHX5780

## 2020-01-09 HISTORY — PX: BIOPSY: SHX5522

## 2020-01-09 HISTORY — PX: ESOPHAGOGASTRODUODENOSCOPY (EGD) WITH PROPOFOL: SHX5813

## 2020-01-09 LAB — GLUCOSE, CAPILLARY
Glucose-Capillary: 126 mg/dL — ABNORMAL HIGH (ref 70–99)
Glucose-Capillary: 125 mg/dL — ABNORMAL HIGH (ref 70–99)
Glucose-Capillary: 225 mg/dL — ABNORMAL HIGH (ref 70–99)
Glucose-Capillary: 183 mg/dL — ABNORMAL HIGH (ref 70–99)

## 2020-01-09 LAB — HM COLONOSCOPY

## 2020-01-09 LAB — SURGICAL PCR SCREEN
MRSA, PCR: NEGATIVE
Staphylococcus aureus: NEGATIVE

## 2020-01-09 SURGERY — ESOPHAGOGASTRODUODENOSCOPY (EGD) WITH PROPOFOL
Anesthesia: Monitor Anesthesia Care

## 2020-01-09 MED ORDER — LIDOCAINE HCL (CARDIAC) PF 100 MG/5ML IV SOSY
PREFILLED_SYRINGE | INTRAVENOUS | Status: DC | PRN
  Administered 2020-01-09: 60 mg via INTRATRACHEAL

## 2020-01-09 MED ORDER — ENSURE MAX PROTEIN PO LIQD
11.0000 [oz_av] | Freq: Every day | ORAL | Status: DC
  Administered 2020-01-10: 11 [oz_av] via ORAL

## 2020-01-09 MED ORDER — ADULT MULTIVITAMIN W/MINERALS CH
1.0000 | ORAL_TABLET | Freq: Every day | ORAL | Status: DC
  Administered 2020-01-09 – 2020-01-10 (×2): 1 via ORAL
  Filled 2020-01-09 (×2): qty 1

## 2020-01-09 MED ORDER — PROPOFOL 500 MG/50ML IV EMUL
INTRAVENOUS | Status: DC | PRN
  Administered 2020-01-09 (×2): 20 mg via INTRAVENOUS
  Administered 2020-01-09: 10 mg via INTRAVENOUS

## 2020-01-09 MED ORDER — EPHEDRINE SULFATE-NACL 50-0.9 MG/10ML-% IV SOSY
PREFILLED_SYRINGE | INTRAVENOUS | Status: DC | PRN
  Administered 2020-01-09 (×3): 5 mg via INTRAVENOUS

## 2020-01-09 MED ORDER — PHENYLEPHRINE HCL (PRESSORS) 10 MG/ML IV SOLN
INTRAVENOUS | Status: DC | PRN
  Administered 2020-01-09: 80 ug via INTRAVENOUS

## 2020-01-09 MED ORDER — PROPOFOL 500 MG/50ML IV EMUL
INTRAVENOUS | Status: DC | PRN
  Administered 2020-01-09: 135 ug/kg/min via INTRAVENOUS

## 2020-01-09 MED ORDER — LACTATED RINGERS IV SOLN: INTRAVENOUS | Status: DC | PRN

## 2020-01-09 MED ORDER — ENOXAPARIN SODIUM 40 MG/0.4ML ~~LOC~~ SOLN
40.0000 mg | SUBCUTANEOUS | Status: DC
  Administered 2020-01-10: 40 mg via SUBCUTANEOUS
  Filled 2020-01-09 (×2): qty 0.4

## 2020-01-09 SURGICAL SUPPLY — 25 items

## 2020-01-09 NOTE — Anesthesia Preprocedure Evaluation (Addendum)
Anesthesia Evaluation  Patient identified by MRN, date of birth, ID band Patient awake    Reviewed: Allergy & Precautions, NPO status , Patient's Chart, lab work & pertinent test results  History of Anesthesia Complications Negative for: history of anesthetic complications  Airway Mallampati: II  TM Distance: >3 FB Neck ROM: Full    Dental  (+) Dental Advisory Given   Pulmonary asthma , former smoker,    Pulmonary exam normal        Cardiovascular hypertension, Pt. on medications Normal cardiovascular exam     Neuro/Psych PSYCHIATRIC DISORDERS Anxiety Depression  Hx epidural abscess     GI/Hepatic Neg liver ROS, GERD  Controlled and Medicated,  Endo/Other  diabetes, Type 2, Oral Hypoglycemic Agents Hyponatremia   Renal/GU negative Renal ROS    Testosterone deficiency     Musculoskeletal  (+) Arthritis ,   Abdominal   Peds  Hematology negative hematology ROS (+)   Anesthesia Other Findings Covid test negative   Reproductive/Obstetrics                            Anesthesia Physical Anesthesia Plan  ASA: III  Anesthesia Plan: MAC   Post-op Pain Management:    Induction: Intravenous  PONV Risk Score and Plan: 1 and Propofol infusion and Treatment may vary due to age or medical condition  Airway Management Planned: Nasal Cannula and Natural Airway  Additional Equipment: None  Intra-op Plan:   Post-operative Plan:   Informed Consent: I have reviewed the patients History and Physical, chart, labs and discussed the procedure including the risks, benefits and alternatives for the proposed anesthesia with the patient or authorized representative who has indicated his/her understanding and acceptance.       Plan Discussed with: CRNA and Anesthesiologist  Anesthesia Plan Comments:        Anesthesia Quick Evaluation

## 2020-01-09 NOTE — Progress Notes (Signed)
PROGRESS NOTE    Luis Brown  HEN:[id number] DOB: 1946-02-06 DOA: 01/07/2020 PCP: Mackie Pai, PA-C  Brief Narrative: Mr. 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 Drucilla Schmidt of infectious disease and Dr. Louanne Skye of orthopedics. -He has been on antibiotics off and on since July 2020 for osteomyelitis, completed his second prolonged course of IV ceftriaxone in April followed by 3 months of doxycycline which he remains on at this time. -Presents to the ED with worsening nausea, intermittent episodes of vomiting, severe anorexia and 35 pound weight loss over the last 2 months.  Work-up in the ED was unremarkable, he had a CT scan done by his PCP on 6/10 which indicated mild biliary ductal dilation with gallstones.  Assessment & Plan:   Nausea, severe anorexia and profound weight loss -Suspect peptic ulcer disease, from long-term NSAIDs and possibly a component of pill esophagitis from 81-month course of doxycycline -Continue IV PPI -Appreciate gastroenterology input -Plan for EGD and colonoscopy today -MRCP with mildly dilated CBD which is stable, no stones or other obstruction noted  Type 2 diabetes mellitus -CBG stable, oral hypoglycemics on hold, continue sliding scale insulin  Chronic lumbar and thoracic osteomyelitis/discitis -Completed second 6-week course of IV ceftriaxone in April, currently remains on oral doxycycline -Followed by Dr. Drucilla Schmidt and Louanne Skye  BPH -Continue Flomax  Hypertension -Continue lisinopril  Severe protein calorie malnutrition -RD consulting, supplements as tolerated  DVT prophylaxis: Lovenox tomorrow Code Status: Full code Family Communication: No family at bedside Disposition Plan:  Status is: Inpatient  Remains inpatient appropriate because:Ongoing diagnostic testing needed not  appropriate for outpatient work up   Dispo: The patient is from: Home              Anticipated d/c is to: Home              Anticipated d/c date is: 2 days              Patient currently is not medically stable to d/c.   Consultants:  [facility]  Procedures:   Antimicrobials:    Subjective: -Feels better today, appetite is improving  Objective: Vitals:   01/08/20 1545 01/08/20 2231 01/09/20 0609 01/09/20 0900  BP:  (!) 109/55 134/66 (!) 106/49  Pulse:  (!) 57 (!) 52 66  Resp: 16 18 18 17   Temp: 98.2 F (36.8 C) (!) 97.4 F (36.3 C) (!) 97.5 F (36.4 C) 98 F (36.7 C)  TempSrc:    Oral  SpO2:  99% 99% 96%  Weight:    72.1 kg  Height:    5\' 10"  (1.778 m)    Intake/Output Summary (Last 24 hours) at 01/09/2020 1011 Last data filed at 01/09/2020 1000 Gross per 24 hour  Intake 2180 ml  Output 0 ml  Net 2180 ml   Filed Weights   01/07/20 1140 01/07/20 1214 01/09/20 0900  Weight: 73.8 kg 72.1 kg 72.1 kg    Examination:  General exam: Elderly male sitting up in bed, AAOx3, no distress Respiratory system: Clear to auscultation Cardiovascular system: S1 & S2 heard, RRR. Gastrointestinal system: Abdomen is nondistended, soft and nontender.Normal bowel sounds heard. Central nervous system: Alert and oriented. No focal neurological deficits. Extremities: No edema Skin: No rashes on exposed skin  psychiatry: Mood & affect appropriate.   Data Reviewed:   CBC: Recent Labs  Lab 01/07/20  1259 01/08/20 0544  WBC 8.4 7.3  NEUTROABS 7.1  --   HGB 14.4 13.5  HCT 43.0 40.1  MCV 90.9 88.7  PLT 318 914   Basic Metabolic Panel: Recent Labs  Lab 01/07/20 1259 01/08/20 0544  NA 129* 134*  K 3.8 4.0  CL 92* 98  CO2 25 26  GLUCOSE 147* 117*  BUN 12 11  CREATININE 0.76 0.74  CALCIUM 9.1 8.9   GFR: Estimated Creatinine Clearance: 80.1 mL/min (by C-G formula based on SCr of 0.74 mg/dL). Liver Function Tests: Recent Labs  Lab 01/07/20 1259 01/08/20 0544  AST 24  20  ALT 13 14  ALKPHOS 80 72  BILITOT 1.3* 1.5*  PROT 7.5 6.8  ALBUMIN 4.7 4.3   Recent Labs  Lab 01/07/20 1259  LIPASE 15   No results for input(s): AMMONIA in the last 168 hours. Coagulation Profile: No results for input(s): INR, PROTIME in the last 168 hours. Cardiac Enzymes: No results for input(s): CKTOTAL, CKMB, CKMBINDEX, TROPONINI in the last 168 hours. BNP (last 3 results) No results for input(s): PROBNP in the last 8760 hours. HbA1C: No results for input(s): HGBA1C in the last 72 hours. CBG: Recent Labs  Lab 01/08/20 0104 01/08/20 1256 01/08/20 1656 01/08/20 2228 01/09/20 0732  GLUCAP 107* 124* 223* 84 126*   Lipid Profile: No results for input(s): CHOL, HDL, LDLCALC, TRIG, CHOLHDL, LDLDIRECT in the last 72 hours. Thyroid Function Tests: No results for input(s): TSH, T4TOTAL, FREET4, T3FREE, THYROIDAB in the last 72 hours. Anemia Panel: No results for input(s): VITAMINB12, FOLATE, FERRITIN, TIBC, IRON, RETICCTPCT in the last 72 hours. Urine analysis:    Component Value Date/Time   COLORURINE YELLOW 01/07/2020 1250   APPEARANCEUR CLEAR 01/07/2020 1250   LABSPEC 1.015 01/07/2020 1250   PHURINE 6.5 01/07/2020 1250   GLUCOSEU NEGATIVE 01/07/2020 1250   HGBUR NEGATIVE 01/07/2020 1250   BILIRUBINUR NEGATIVE 01/07/2020 1250   BILIRUBINUR negative 10/08/2019 1430   KETONESUR 40 (A) 01/07/2020 1250   PROTEINUR NEGATIVE 01/07/2020 1250   UROBILINOGEN negative (A) 10/08/2019 1430   UROBILINOGEN 0.2 04/08/2011 0956   NITRITE NEGATIVE 01/07/2020 1250   LEUKOCYTESUR NEGATIVE 01/07/2020 1250   Sepsis Labs: @LABRCNTIP (procalcitonin:4,lacticidven:4)  ) Recent Results (from the past 240 hour(s))  SARS Coronavirus 2 by RT PCR (hospital order, performed in [facility] hospital lab) Nasopharyngeal Nasopharyngeal Swab     Status: None   Collection Time: 01/07/20  4:11 PM   Specimen: Nasopharyngeal Swab  Result Value Ref Range Status   SARS Coronavirus 2 NEGATIVE  NEGATIVE Final    Comment: (NOTE) SARS-CoV-2 target nucleic acids are NOT DETECTED.  The SARS-CoV-2 RNA is generally detectable in upper and lower respiratory specimens during the acute phase of infection. The lowest concentration of SARS-CoV-2 viral copies this assay can detect is 250 copies / mL. A negative result does not preclude SARS-CoV-2 infection and should not be used as the sole basis for treatment or other patient management decisions.  A negative result may occur with improper specimen collection / handling, submission of specimen other than nasopharyngeal swab, presence of viral mutation(s) within the areas targeted by this assay, and inadequate number of viral copies (<250 copies / mL). A negative result must be combined with clinical observations, patient history, and epidemiological information.  Fact Sheet for Patients:   StrictlyIdeas.no  Fact Sheet for Healthcare Providers: BankingDealers.co.za  This test is not yet approved or  cleared by the [place] FDA and has been authorized for detection and/or  diagnosis of SARS-CoV-2 by FDA under an Emergency Use Authorization (EUA).  This EUA will remain in effect (meaning this test can be used) for the duration of the COVID-19 declaration under Section 564(b)(1) of the Act, 21 U.S.C. section 360bbb-3(b)(1), unless the authorization is terminated or revoked sooner.  Performed at [facility], [address], [place], [place] [address]   Surgical pcr screen     Status: None   Collection Time: 01/09/20  3:19 AM   Specimen: Nasal Mucosa; Nasal Swab  Result Value Ref Range Status   MRSA, PCR NEGATIVE NEGATIVE Final   Staphylococcus aureus NEGATIVE NEGATIVE Final    Comment: (NOTE) The Xpert SA Assay (FDA approved for NASAL specimens in patients 71 years of age and older), is one component of a comprehensive surveillance program. It is not intended to  diagnose infection nor to guide or monitor treatment. Performed at [facility], [place] [address] [place], [place] [address]          Radiology Studies: MR 3D Recon At Scanner  Result Date: 01/08/2020 CLINICAL DATA:  Cholelithiasis. Weight loss and recurrent vomiting for 3 months. EXAM: MRI ABDOMEN WITHOUT AND WITH CONTRAST (INCLUDING MRCP) TECHNIQUE: Multiplanar multisequence MR imaging of the abdomen was performed both before and after the administration of intravenous contrast. Heavily T2-weighted images of the biliary and pancreatic ducts were obtained, and three-dimensional MRCP images were rendered by post processing. CONTRAST:  10mL GADAVIST GADOBUTROL 1 MMOL/ML IV SOLN COMPARISON:  Abdominopelvic CT 12/26/2019 FINDINGS: Lower chest:  The visualized lower chest appears unremarkable. Hepatobiliary: No morphologic changes of hepatic cirrhosis or significant hepatic steatosis. No focal hepatic lesion or abnormal enhancement seen following contrast. As demonstrated on recent CT, there is mild intra and extrahepatic biliary dilatation. The common hepatic duct measures 14 mm maximally, and tapers distally. No evidence of choledocholithiasis. The gallbladder appears normal without stones, wall thickening or surrounding inflammation. Pancreas: No evidence of pancreatic mass, ductal dilatation or variant ductal anatomy. No surrounding inflammatory changes or fluid collections. Spleen: Normal in size without focal abnormality. Adrenals/Urinary Tract: Both adrenal glands appear normal. Probable tiny bilateral renal cysts. No evidence of renal mass or hydronephrosis. Stomach/Bowel: No evidence of bowel wall thickening, distention or surrounding inflammatory change. Vascular/Lymphatic: There are no enlarged abdominal lymph nodes. No significant vascular findings. Other: No ascites.  The visualized abdominal wall is intact. Musculoskeletal: Extensive postsurgical changes from previous  thoracolumbar fusion and decompression. No acute osseous findings. 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 IMPRESSION: 1. Stable mild intra and extrahepatic biliary dilatation compared with CT of 2 weeks ago. No evidence of choledocholithiasis or other specific explanation. Given minimal elevation of just the patient's indirect bilirubin level, this is of uncertain significance. Continued follow-up of the patient's LFTs recommended. 2. The gallbladder appears normal. No morphologic changes of hepatic cirrhosis or significant hepatic steatosis. 3. No acute abdominal findings. Electronically Signed   By: Richardean Sale M.D.   On: 01/08/2020 13:01   DG Chest Portable 1 View  Result Date: 01/07/2020 CLINICAL DATA:  Weight loss. EXAM: PORTABLE CHEST 1 VIEW COMPARISON:  02/08/2019. FINDINGS: Mediastinum and hilar structures normal. Heart size normal. No focal infiltrate. No pleural effusion or pneumothorax. Prior thoracic spine fusion. IMPRESSION: No acute cardiopulmonary disease. Electronically Signed   By: Marcello Moores  Register   On: 01/07/2020 13:03   MR ABDOMEN MRCP W WO CONTAST  Result Date: 01/08/2020 CLINICAL DATA:  Cholelithiasis. Weight loss and recurrent vomiting for 3 months. EXAM: MRI ABDOMEN WITHOUT  AND WITH CONTRAST (INCLUDING MRCP) TECHNIQUE: Multiplanar multisequence MR imaging of the abdomen was performed both before and after the administration of intravenous contrast. Heavily T2-weighted images of the biliary and pancreatic ducts were obtained, and three-dimensional MRCP images were rendered by post processing. CONTRAST:  39mL GADAVIST GADOBUTROL 1 MMOL/ML IV SOLN COMPARISON:  Abdominopelvic CT 12/26/2019 FINDINGS: Lower chest:  The visualized lower chest appears unremarkable. Hepatobiliary: No morphologic changes of hepatic cirrhosis or significant hepatic steatosis. No focal hepatic lesion or abnormal enhancement seen following contrast. As demonstrated on recent CT, there is mild intra and  extrahepatic biliary dilatation. The common hepatic duct measures 14 mm maximally, and tapers distally. No evidence of choledocholithiasis. The gallbladder appears normal without stones, wall thickening or surrounding inflammation. Pancreas: No evidence of pancreatic mass, ductal dilatation or variant ductal anatomy. No surrounding inflammatory changes or fluid collections. Spleen: Normal in size without focal abnormality. Adrenals/Urinary Tract: Both adrenal glands appear normal. Probable tiny bilateral renal cysts. No evidence of renal mass or hydronephrosis. Stomach/Bowel: No evidence of bowel wall thickening, distention or surrounding inflammatory change. Vascular/Lymphatic: There are no enlarged abdominal lymph nodes. No significant vascular findings. Other: No ascites.  The visualized abdominal wall is intact. Musculoskeletal: Extensive postsurgical changes from previous thoracolumbar fusion and decompression. No acute osseous findings. 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 IMPRESSION: 1. Stable mild intra and extrahepatic biliary dilatation compared with CT of 2 weeks ago. No evidence of choledocholithiasis or other specific explanation. Given minimal elevation of just the patient's indirect bilirubin level, this is of uncertain significance. Continued follow-up of the patient's LFTs recommended. 2. The gallbladder appears normal. No morphologic changes of hepatic cirrhosis or significant hepatic steatosis. 3. No acute abdominal findings. Electronically Signed   By: Richardean Sale M.D.   On: 01/08/2020 13:01        Scheduled Meds: . [MAR Hold] atorvastatin  10 mg Oral Daily  . [MAR Hold] busPIRone  7.5 mg Oral TID  . [MAR Hold] diazepam  5 mg Oral QHS  . [MAR Hold] doxycycline  100 mg Oral BID  . [MAR Hold] feeding supplement (ENSURE ENLIVE)  237 mL Oral BID BM  . [MAR Hold] Ferrous Fumarate  1 tablet Oral Daily  . [MAR Hold] insulin aspart  0-9 Units Subcutaneous TID WC  . [MAR Hold]  lisinopril  2.5 mg Oral Daily  . [MAR Hold] pantoprazole (PROTONIX) IV  40 mg Intravenous Q12H  . [MAR Hold] pregabalin  75 mg Oral BID  . [MAR Hold] tamsulosin  0.8 mg Oral Daily  . [MAR Hold] venlafaxine XR  150 mg Oral Q breakfast   Continuous Infusions: . sodium chloride 20 mL/hr at 01/08/20 2300     LOS: 1 day    Time spent: 75min    Domenic Polite, MD [facility] 01/09/2020, 10:11 AM

## 2020-01-09 NOTE — Anesthesia Postprocedure Evaluation (Signed)
Anesthesia Post Note  Patient: Luis Brown  Procedure(s) Performed: ESOPHAGOGASTRODUODENOSCOPY (EGD) WITH PROPOFOL (N/A ) COLONOSCOPY WITH PROPOFOL (N/A ) BIOPSY POLYPECTOMY     Patient location during evaluation: PACU Anesthesia Type: MAC Level of consciousness: awake and alert Pain management: pain level controlled Vital Signs Assessment: post-procedure vital signs reviewed and stable Respiratory status: spontaneous breathing, nonlabored ventilation and respiratory function stable Cardiovascular status: stable and blood pressure returned to baseline Anesthetic complications: no   No complications documented.  Last Vitals:  Vitals:   01/09/20 1050 01/09/20 1103  BP: (!) 143/52 (!) 142/61  Pulse: 60 (!) 53  Resp: (!) 23 18  Temp:  36.4 C  SpO2: 100% 100%    Last Pain:  Vitals:   01/09/20 1050  TempSrc:   PainSc: 0-No pain                 Audry Pili

## 2020-01-09 NOTE — Transfer of Care (Signed)
Immediate Anesthesia Transfer of Care Note  Patient: Luis Brown  Procedure(s) Performed: ESOPHAGOGASTRODUODENOSCOPY (EGD) WITH PROPOFOL (N/A ) COLONOSCOPY WITH PROPOFOL (N/A ) BIOPSY POLYPECTOMY  Patient Location: Endoscopy Unit  Anesthesia Type:MAC  Level of Consciousness: awake, oriented, patient cooperative and responds to stimulation  Airway & Oxygen Therapy: Patient Spontanous Breathing and Patient connected to face mask oxygen  Post-op Assessment: Report given to RN and Post -op Vital signs reviewed and stable  Post vital signs: Reviewed and stable  Last Vitals:  Vitals Value Taken Time  BP 126/39 01/09/20 1017  Temp    Pulse 58 01/09/20 1020  Resp 19 01/09/20 1020  SpO2 100 % 01/09/20 1020  Vitals shown include unvalidated device data.  Last Pain:  Vitals:   01/09/20 0900  TempSrc: Oral  PainSc: 6       Patients Stated Pain Goal: 3 (40/33/53 3174)  Complications: No complications documented.

## 2020-01-09 NOTE — Brief Op Note (Signed)
01/07/2020 - 01/09/2020  10:32 AM  PATIENT:  Luis Brown  74 y.o. male  PRE-OPERATIVE DIAGNOSIS:  Nausea, vomiting, abdominal pain, weight loss, and prior history of colon polyp  POST-OPERATIVE DIAGNOSIS:  gastric ulcers ,gastritis / duodeal ulcer  PROCEDURE:  Procedure(s): ESOPHAGOGASTRODUODENOSCOPY (EGD) WITH PROPOFOL (N/A) COLONOSCOPY WITH PROPOFOL (N/A) BIOPSY POLYPECTOMY  SURGEON:  Surgeon(s) and Role:    * Adrielle Polakowski, MD - Primary  Findings ------------ -EGD showed multiple large gastric and duodenal ulcers.  Superficial.  No active bleeding.  Biopsies taken. -Colonoscopy showed 3 diminutive polyps.  Removed with cold snare.  No evidence of active bleeding.  Normal terminal ileum.   Recommendations ------------------------ -Recommend pantoprazole 40 mg twice a day for next 3 months. -Recommend repeat EGD in 2 to 3 months to document healing of gastric ulcers -Avoid NSAIDs, smoking and alcohol. -Start soft diet and advance as tolerated. -No further inpatient GI work-up planned.  Okay to discharge from GI standpoint later today or tomorrow. -Follow-up in GI clinic in 6 weeks. -GI will sign off.  Call us back if needed  Otis Brace MD, [place] 01/09/2020, 10:33 AM  Contact #  [phone]

## 2020-01-09 NOTE — Progress Notes (Signed)
[facility] Gastroenterology Progress Note  Luis Brown 74 y.o. 04-May-1946  CC: Weight loss, melena, elevated T bili   Subjective: Patient seen and examined in endoscopy unit.  Denies any acute issues.  Had some dark-colored stool during prep for colonoscopy yesterday.  Denies seeing fresh blood in the stool.  Denies any further nausea and vomiting since starting Zofran.  ROS : Denies chest pain and shortness of breath   Objective: Vital signs in last 24 hours: Vitals:   01/08/20 2231 01/09/20 0609  BP: (!) 109/55 134/66  Pulse: (!) 57 (!) 52  Resp: 18 18  Temp: (!) 97.4 F (36.3 C) (!) 97.5 F (36.4 C)  SpO2: 99% 99%    Physical Exam:  General:  Alert, cooperative, no distress, appears stated age  Head:  Normocephalic, without obvious abnormality, atraumatic  Eyes:  , EOM's intact,   Lungs:   Clear to auscultation bilaterally, anterior exam only  Heart:  Regular rate and rhythm, S1, S2 normal  Abdomen:   Soft, non-tender, bowel sounds active all four quadrants,  no masses,   Extremities: Extremities normal, atraumatic, no  edema       Lab Results: Recent Labs    01/07/20 1259 01/08/20 0544  NA 129* 134*  K 3.8 4.0  CL 92* 98  CO2 25 26  GLUCOSE 147* 117*  BUN 12 11  CREATININE 0.76 0.74  CALCIUM 9.1 8.9   Recent Labs    01/07/20 1259 01/08/20 0544  AST 24 20  ALT 13 14  ALKPHOS 80 72  BILITOT 1.3* 1.5*  PROT 7.5 6.8  ALBUMIN 4.7 4.3   Recent Labs    01/07/20 1259 01/08/20 0544  WBC 8.4 7.3  NEUTROABS 7.1  --   HGB 14.4 13.5  HCT 43.0 40.1  MCV 90.9 88.7  PLT 318 286   No results for input(s): LABPROT, INR in the last 72 hours.    Assessment/Plan: -Nausea and melena.  Could be ulcer disease from NSAID use.  Hemoglobin stable.  Nausea resolved with use of Zofran. -Weight loss.  Could be from decreased oral intake. -Dilated biliary tree.  MRI MRCP negative for any obstructive lesion yesterday. -Indirect  hyperbilirubinemia.  Recommendations ------------------------ -MRI findings discussed with the patient. -Proceed with EGD and colonoscopy today.  Risks (bleeding, infection, bowel perforation that could require surgery, sedation-related changes in cardiopulmonary systems), benefits (identification and possible treatment of source of symptoms, exclusion of certain causes of symptoms), and alternatives (watchful waiting, radiographic imaging studies, empiric medical treatment)  were explained to patient in detail and patient wishes to proceed.   Otis Brace MD, [place] 01/09/2020, 9:05 AM  Contact #  [phone]

## 2020-01-09 NOTE — Op Note (Signed)
[facility] Patient Name: Luis Brown Procedure Date: 01/09/2020 MRN: [id number] Attending MD: Otis Brace , MD Date of Birth: [birth year] CSN: [id number] Age: 74 Admit Type: Inpatient Procedure:                Colonoscopy Indications:              Last colonoscopy: 2014, Gastrointestinal bleeding,                            Personal history of colonic polyps, Weight loss Providers:                Otis Brace, MD, Cleda Daub, RN, Corie Chiquito, Technician, Derrek Gu. Alday CRNA, CRNA Referring MD:              Medicines:                Sedation Administered by an Anesthesia Professional Complications:            No immediate complications. Estimated Blood Loss:     Estimated blood loss was minimal. Procedure:                Pre-Anesthesia Assessment:                           - Prior to the procedure, a History and Physical                            was performed, and patient medications and                            allergies were reviewed. The patient's tolerance of                            previous anesthesia was also reviewed. The risks                            and benefits of the procedure and the sedation                            options and risks were discussed with the patient.                            All questions were answered, and informed consent                            was obtained. Prior Anticoagulants: The patient has                            taken no previous anticoagulant or antiplatelet                            agents except for NSAID medication. ASA Grade  Assessment: III - A patient with severe systemic                            disease. After reviewing the risks and benefits,                            the patient was deemed in satisfactory condition to                            undergo the procedure.                           - Prior to the procedure, a History  and Physical                            was performed, and patient medications and                            allergies were reviewed. The patient's tolerance of                            previous anesthesia was also reviewed. The risks                            and benefits of the procedure and the sedation                            options and risks were discussed with the patient.                            All questions were answered, and informed consent                            was obtained. Prior Anticoagulants: The patient has                            taken no previous anticoagulant or antiplatelet                            agents except for NSAID medication. ASA Grade                            Assessment: III - A patient with severe systemic                            disease. After reviewing the risks and benefits,                            the patient was deemed in satisfactory condition to                            undergo the procedure.  After obtaining informed consent, the colonoscope                            was passed under direct vision. Throughout the                            procedure, the patient's blood pressure, pulse, and                            oxygen saturations were monitored continuously. The                            PCF-H190DL ([id number] Olympus pediatric colonscope                            was introduced through the anus and advanced to the                            the terminal ileum, with identification of the                            appendiceal orifice and IC valve. The colonoscopy                            was performed without difficulty. The patient                            tolerated the procedure well. The quality of the                            bowel preparation was good except the cecum and                            proximal ascending colon was fair. Scope In: 9:45:43 AM Scope Out: 10:10:27  AM Scope Withdrawal Time: 0 hours 13 minutes 2 seconds  Total Procedure Duration: 0 hours 24 minutes 44 seconds  Findings:      The perianal and digital rectal examinations were normal.      The terminal ileum appeared normal.      A 2 mm polyp was found in the cecum. The polyp was sessile. The polyp       was removed with a cold snare. Resection and retrieval were complete.      Two sessile polyps were found in the transverse colon. The polyps were 3       to 4 mm in size. These polyps were removed with a cold snare. Resection       and retrieval were complete.      There is no endoscopic evidence of bleeding in the entire colon.      Internal hemorrhoids were found during retroflexion. The hemorrhoids       were small. Impression:               - The examined portion of the ileum was normal.                           -  One 2 mm polyp in the cecum, removed with a cold                            snare. Resected and retrieved.                           - Two 3 to 4 mm polyps in the transverse colon,                            removed with a cold snare. Resected and retrieved.                           - Internal hemorrhoids. Moderate Sedation:      Moderate (conscious) sedation was personally administered by an       anesthesia professional. The following parameters were monitored: oxygen       saturation, heart rate, blood pressure, and response to care. Recommendation:           - Patient has a contact number available for                            emergencies. The signs and symptoms of potential                            delayed complications were discussed with the                            patient. Return to normal activities tomorrow.                            Written discharge instructions were provided to the                            patient.                           - Resume previous diet.                           - Continue present medications.                            - Await pathology results.                           - Repeat colonoscopy in 3 - 5 years for                            surveillance based on pathology results.                           - Return to GI office in 6 weeks. Procedure Code(s):        --- Professional ---                           [phone], Colonoscopy,  flexible; with removal of                            tumor(s), polyp(s), or other lesion(s) by snare                            technique Diagnosis Code(s):        --- Professional ---                           K64.8, Other hemorrhoids                           K63.5, Polyp of colon                           K92.2, Gastrointestinal hemorrhage, unspecified                           Z86.010, Personal history of colonic polyps                           R63.4, Abnormal weight loss CPT copyright 2019 [employer]. All rights reserved. The codes documented in this report are preliminary and upon coder review may  be revised to meet current compliance requirements. Otis Brace, MD Otis Brace, MD 01/09/2020 10:29:01 AM Number of Addenda: 0

## 2020-01-09 NOTE — Op Note (Signed)
[facility] Patient Name: Luis Brown Procedure Date: 01/09/2020 MRN: [id number] Attending MD: Otis Brace , MD Date of Birth: June 25, 1946 CSN: [id number] Age: 74 Admit Type: Inpatient Procedure:                Upper GI endoscopy Indications:              Melena Providers:                Otis Brace, MD, Cleda Daub, RN, Corie Chiquito, Technician, Derrek Gu. Alday CRNA, CRNA Referring MD:              Medicines:                Sedation Administered by an Anesthesia Professional Complications:            No immediate complications. Estimated Blood Loss:     Estimated blood loss was minimal. Procedure:                Pre-Anesthesia Assessment:                           - Prior to the procedure, a History and Physical                            was performed, and patient medications and                            allergies were reviewed. The patient's tolerance of                            previous anesthesia was also reviewed. The risks                            and benefits of the procedure and the sedation                            options and risks were discussed with the patient.                            All questions were answered, and informed consent                            was obtained. Prior Anticoagulants: The patient has                            taken no previous anticoagulant or antiplatelet                            agents except for NSAID medication. ASA Grade                            Assessment: III - A patient with severe systemic  disease. After reviewing the risks and benefits,                            the patient was deemed in satisfactory condition to                            undergo the procedure.                           After obtaining informed consent, the endoscope was                            passed under direct vision. Throughout the                             procedure, the patient's blood pressure, pulse, and                            oxygen saturations were monitored continuously. The                            GIF-H190 ([id number]) was introduced through the                            mouth, and advanced to the second part of duodenum.                            The upper GI endoscopy was accomplished without                            difficulty. The patient tolerated the procedure                            well. Scope In: Scope Out: Findings:      LA Grade A (one or more mucosal breaks less than 5 mm, not extending       between tops of 2 mucosal folds) esophagitis with no bleeding was found       in the distal esophagus. Biopsies were taken with a cold forceps for       histology.      Many non-bleeding superficial gastric ulcers were found in the gastric       antrum and in the prepyloric region of the stomach. The largest lesion       was 15 mm in largest dimension. Biopsies were taken with a cold forceps       for histology.      Scattered severe inflammation characterized by congestion (edema),       erosions and friability was found in the gastric body.      The cardia and gastric fundus were normal on retroflexion.      Many non-bleeding superficial duodenal ulcers with no stigmata of       bleeding were found in the entire duodenum. The largest lesion was 10 mm       in largest dimension. Biopsies were taken with a cold forceps for       histology. Impression:               -  LA Grade A esophagitis with no bleeding. Biopsied.                           - Non-bleeding gastric ulcers. Biopsied.                           - Gastritis.                           - Non-bleeding duodenal ulcers with no stigmata of                            bleeding. Biopsied. Moderate Sedation:      Moderate (conscious) sedation was personally administered by an       anesthesia professional. The following parameters were monitored: oxygen        saturation, heart rate, blood pressure, and response to care. Recommendation:           - Use Protonix (pantoprazole) 40 mg PO BID for 3                            months.                           - No aspirin, ibuprofen, naproxen, or other                            non-steroidal anti-inflammatory drugs.                           - Repeat upper endoscopy in 2 months to check                            healing.                           - Perform a colonoscopy today. Procedure Code(s):        --- Professional ---                           [phone], Esophagogastroduodenoscopy, flexible,                            transoral; with biopsy, single or multiple Diagnosis Code(s):        --- Professional ---                           K20.90, Esophagitis, unspecified without bleeding                           K25.9, Gastric ulcer, unspecified as acute or                            chronic, without hemorrhage or perforation                           K29.70, Gastritis, unspecified, without bleeding  K26.9, Duodenal ulcer, unspecified as acute or                            chronic, without hemorrhage or perforation                           K92.1, Melena (includes Hematochezia) CPT copyright 2019 [employer]. All rights reserved. The codes documented in this report are preliminary and upon coder review may  be revised to meet current compliance requirements. Otis Brace, MD Otis Brace, MD 01/09/2020 10:23:15 AM Number of Addenda: 0

## 2020-01-10 ENCOUNTER — Other Ambulatory Visit: Payer: Self-pay

## 2020-01-10 ENCOUNTER — Encounter (HOSPITAL_COMMUNITY): Payer: Self-pay | Admitting: Gastroenterology

## 2020-01-10 DIAGNOSIS — K279 Peptic ulcer, site unspecified, unspecified as acute or chronic, without hemorrhage or perforation: Secondary | ICD-10-CM

## 2020-01-10 DIAGNOSIS — R627 Adult failure to thrive: Secondary | ICD-10-CM

## 2020-01-10 DIAGNOSIS — K209 Esophagitis, unspecified without bleeding: Secondary | ICD-10-CM

## 2020-01-10 DIAGNOSIS — K253 Acute gastric ulcer without hemorrhage or perforation: Secondary | ICD-10-CM

## 2020-01-10 LAB — SURGICAL PATHOLOGY

## 2020-01-10 LAB — COMPREHENSIVE METABOLIC PANEL
ALT: 10 U/L (ref 0–44)
AST: 14 U/L — ABNORMAL LOW (ref 15–41)
Albumin: 4 g/dL (ref 3.5–5.0)
Alkaline Phosphatase: 68 U/L (ref 38–126)
Anion gap: 7 (ref 5–15)
BUN: 10 mg/dL (ref 8–23)
CO2: 28 mmol/L (ref 22–32)
Calcium: 8.8 mg/dL — ABNORMAL LOW (ref 8.9–10.3)
Chloride: 100 mmol/L (ref 98–111)
Creatinine, Ser: 0.64 mg/dL (ref 0.61–1.24)
GFR calc Af Amer: >60 mL/min (ref >60)
GFR calc non Af Amer: >60 mL/min (ref >60)
Glucose, Bld: 168 mg/dL — ABNORMAL HIGH (ref 70–99)
Potassium: 4.3 mmol/L (ref 3.5–5.1)
Sodium: 135 mmol/L (ref 135–145)
Total Bilirubin: 1 mg/dL (ref 0.3–1.2)
Total Protein: 6.2 g/dL — ABNORMAL LOW (ref 6.5–8.1)

## 2020-01-10 LAB — CBC
HCT: 38.6 % — ABNORMAL LOW (ref 39.0–52.0)
Hemoglobin: 12.6 g/dL — ABNORMAL LOW (ref 13.0–17.0)
MCH: 30.1 pg (ref 26.0–34.0)
MCHC: 32.6 g/dL (ref 30.0–36.0)
MCV: 92.1 fL (ref 80.0–100.0)
Platelets: 274 10*3/uL (ref 150–400)
RBC: 4.19 MIL/uL — ABNORMAL LOW (ref 4.22–5.81)
RDW: 15.4 % (ref 11.5–15.5)
WBC: 7 10*3/uL (ref 4.0–10.5)
nRBC: 0 % (ref 0.0–0.2)

## 2020-01-10 LAB — GLUCOSE, CAPILLARY
Glucose-Capillary: 140 mg/dL — ABNORMAL HIGH (ref 70–99)
Glucose-Capillary: 102 mg/dL — ABNORMAL HIGH (ref 70–99)

## 2020-01-10 MED ORDER — ENSURE MAX PROTEIN PO LIQD: 11.0000 [oz_av] | Freq: Every day | ORAL | Status: DC

## 2020-01-10 MED ORDER — LISINOPRIL 5 MG PO TABS: 2.5000 mg | ORAL_TABLET | Freq: Every day | ORAL | Status: DC

## 2020-01-10 MED ORDER — ONDANSETRON HCL 4 MG PO TABS: 4.0000 mg | ORAL_TABLET | Freq: Three times a day (TID) | ORAL | 0 refills | Status: DC | PRN

## 2020-01-10 MED ORDER — DIAZEPAM 10 MG PO TABS: 5.0000 mg | ORAL_TABLET | Freq: Every day | ORAL | Status: DC

## 2020-01-10 MED ORDER — ENSURE ENLIVE PO LIQD: 237.0000 mL | Freq: Two times a day (BID) | ORAL | Status: DC

## 2020-01-10 MED ORDER — ADULT MULTIVITAMIN W/MINERALS CH: 1.0000 | ORAL_TABLET | Freq: Every day | ORAL | Status: DC

## 2020-01-10 MED ORDER — PANTOPRAZOLE SODIUM 40 MG PO TBEC: 40.0000 mg | DELAYED_RELEASE_TABLET | Freq: Two times a day (BID) | ORAL | 1 refills | Status: DC

## 2020-01-10 NOTE — Discharge Summary (Signed)
Physician Discharge Summary  Luis Brown HYQ:[id number] DOB: 04/20/1946 DOA: 01/07/2020  PCP: Mackie Pai, PA-C  Admit date: 01/07/2020 Discharge date: 01/10/2020  Recommendations for Outpatient Follow-up:   Peptic ulcer disease, gastric ulcers, LA grade a esophagitis with associated nausea, vomiting, failure to thrive with weight loss 36 pounds.  --Seen by gastroenterology.  EGD showed LA grade a esophagitis, nonbleeding gastric ulcers, gastritis, nonbleeding duodenal ulcers.  No evidence of bleeding.  3 with 2 mm cecal polyp removed.  Colonoscopy in 3 to 5 years --GI recommended Protonix 40 mg twice daily for 3 months.  No aspirin or NSAIDs.  Repeat upper endoscopy in 2 months to check healing.     Follow-up Information    Wilford Corner, MD Follow up in 6 week(s).   Specialty: Gastroenterology Why: GI bleed , Ulcer disease  Contact information: [address] [address] [phone]        [facility] Follow up.   Specialty: [place] Why: Therapy Services Contact information: [address] [place] [address] [phone]                Discharge Diagnoses: Principal diagnosis is #1 1. Peptic ulcer disease, gastric ulcers, LA grade a esophagitis with associated nausea, vomiting, failure to thrive with weight loss 36 pounds 2. Hyponatremia 3. Diabetes mellitus type 2 4. Chronic lumbar and thoracic osteomyelitis, discitis 5. Severe protein calorie malnutrition, failure to thrive, significant weight loss over the last few months  Discharge Condition: improved Disposition: home  Diet recommendation: low salt, diabetic diet  Filed Weights   01/07/20 1140 01/07/20 1214 01/09/20 0900  Weight: 73.8 kg 72.1 kg 72.1 kg    History of present illness: 74 year old male PMH including diabetes mellitus type 2, chronic osteomyelitis involving thoracic and lumbar spine, on suppressive therapy with  doxycycline, presented with nausea for several months with significant weight loss.  CT abdomen pelvis June 3 showed ductal dilatation with gallstones.  Hospital Course:  Patient was seen by gastroenterology, further work-up as below revealed gastric and duodenal ulcers as well as high-grade a esophagitis.  Improved with Protonix.  Stable for discharge.  Individual issues as below.  Peptic ulcer disease, gastric ulcers, LA grade a esophagitis with associated nausea, vomiting, failure to thrive with weight loss 36 pounds.  Secondary to ibuprofen use 3 times a day for months as well as iron use. significant indigestion at home using Rolaids and Pepto-Bismol.  --MRCP mildly dilated CBD stable, no stones or obstruction. --Seen by gastroenterology.  EGD showed LA grade a esophagitis, nonbleeding gastric ulcers, gastritis, nonbleeding duodenal ulcers.  No evidence of bleeding.  3 with 2 mm cecal polyp removed.  Colonoscopy in 3 to 5 years --GI recommended Protonix 40 mg twice daily for 3 months.  No aspirin or NSAIDs.  Repeat upper endoscopy in 2 months to check healing.  Hyponatremia --Resolved   Diabetes mellitus type 2 --Remained stable.  Chronic lumbar and thoracic osteomyelitis, discitis followed by Dr. Drucilla Schmidt, Dr. Louanne Skye --Continue suppressive therapy with doxycycline  BPH --Continue Flomax  Severe protein calorie malnutrition, failure to thrive, significant weight loss over the last few months --Continue management as per dietitian, Ensure twice daily, Ensure max protein, multivitamin with minerals daily  Today's assessment: S: Feels a lot better today.  Ate a big breakfast, eating much better.  Nausea controlled with antiemetic. O: Vitals:  Vitals:   01/10/20 0832 01/10/20 1140  BP: (!) 161/77 (!) 161/77  Pulse:  67  Resp:    Temp:  98 F (36.7 C)  SpO2:  100%    Constitutional:  . Appears calm and comfortable Respiratory:  . CTA bilaterally, no w/r/r.  . Respiratory  effort normal. Cardiovascular:  . RRR, no m/r/g Psychiatric:  . Mental status o Mood, affect appropriate  CBG stable  Discharge Instructions  Discharge Instructions    Diet - low sodium heart healthy   Complete by: As directed    Diet Carb Modified   Complete by: As directed    Discharge instructions   Complete by: As directed    Call your physician or seek immediate medical attention for pain, vomiting, weight loss, bleeding or worsening of condition.   Increase activity slowly   Complete by: As directed      Allergies as of 01/10/2020   No Known Allergies     Medication List    STOP taking these medications   famotidine 40 MG tablet Commonly known as: PEPCID   ibuprofen 600 MG tablet Commonly known as: ADVIL   ondansetron 4 MG disintegrating tablet Commonly known as: Zofran ODT     TAKE these medications   atorvastatin 20 MG tablet Commonly known as: LIPITOR TAKE 1/2 TABLET BY MOUTH DAILY AT 6 PM What changed: See the new instructions.   busPIRone 7.5 MG tablet Commonly known as: BUSPAR 1-2 TAB BY MOUTH 3 TIMES A DAY FOR ANXIETY What changed: See the new instructions.   cetirizine 10 MG tablet Commonly known as: ZYRTEC Take 10 mg by mouth daily. In the morning   Contour Next Test test strip Generic drug: glucose blood Check blood sugar once daily   diazepam 10 MG tablet Commonly known as: VALIUM Take 0.5 tablets (5 mg total) by mouth at bedtime. What changed: See the new instructions.   doxycycline 100 MG tablet Commonly known as: VIBRA-TABS Take 1 tablet (100 mg total) by mouth 2 (two) times daily.   feeding supplement (ENSURE ENLIVE) Liqd Take 237 mLs by mouth 2 (two) times daily between meals.   Ensure Max Protein Liqd Take 330 mLs (11 oz total) by mouth daily. Start taking on: January 11, 2020   Ferretts 325 (106 Fe) MG Tabs tablet Generic drug: ferrous fumarate TAKE 1 TABLET (106 MG OF IRON TOTAL) BY MOUTH DAILY. What changed: See the  new instructions.   HYDROcodone-acetaminophen 10-325 MG tablet Commonly known as: NORCO Take 1 tablet by mouth every 6 (six) hours as needed. What changed: reasons to take this   lisinopril 5 MG tablet Commonly known as: ZESTRIL Take 0.5 tablets (2.5 mg total) by mouth daily.   metFORMIN 1000 MG tablet Commonly known as: GLUCOPHAGE TAKE 1 TABLET (1,000 MG TOTAL) BY MOUTH 2 (TWO) TIMES DAILY WITH A MEAL.   multivitamin with minerals Tabs tablet Take 1 tablet by mouth daily. Start taking on: January 11, 2020   ondansetron 4 MG tablet Commonly known as: ZOFRAN Take 1 tablet (4 mg total) by mouth every 8 (eight) hours as needed for nausea or vomiting.   pantoprazole 40 MG tablet Commonly known as: Protonix Take 1 tablet (40 mg total) by mouth 2 (two) times daily before a meal.   pregabalin 75 MG capsule Commonly known as: LYRICA Take 1 capsule (75 mg total) by mouth 2 (two) times daily.   sitaGLIPtin 50 MG tablet Commonly known as: Januvia Take 1 tablet (50 mg total) by mouth daily.   tamsulosin 0.4 MG Caps capsule Commonly known as: FLOMAX Take 2  capsules (0.8 mg total) by mouth daily.   testosterone cypionate 200 MG/ML injection Commonly known as: DEPOTESTOSTERONE CYPIONATE Inject 1 mL (200 mg total) into the muscle every 14 (fourteen) days.   tiZANidine 4 MG tablet Commonly known as: ZANAFLEX Take 1.5 tablets (6 mg total) by mouth every 6 (six) hours as needed for muscle spasms.   venlafaxine XR 150 MG 24 hr capsule Commonly known as: EFFEXOR-XR Take 1 capsule (150 mg total) by mouth daily with breakfast.      No Known Allergies  The results of significant diagnostics from this hospitalization (including imaging, microbiology, ancillary and laboratory) are listed below for reference.    Significant Diagnostic Studies: CT Head Wo Contrast  Result Date: 12/17/2019 CLINICAL DATA:  Syncope, abnormal neuro exam. EXAM: CT HEAD WITHOUT CONTRAST TECHNIQUE: Contiguous  axial images were obtained from the base of the skull through the vertex without intravenous contrast. COMPARISON:  01/29/2007 FINDINGS: Brain: No evidence of acute infarction, hemorrhage, hydrocephalus, extra-axial collection or mass lesion/mass effect. Signs of atrophy and chronic microvascular ischemic change. Vascular: No hyperdense vessel or unexpected calcification. Skull: Normal. Negative for fracture or focal lesion. Sinuses/Orbits: Limited visualization of the sinuses, confined mainly to sphenoid sinuses and upper aspects of frontal sinuses is unremarkable. Orbits are not well visualized. Other: None. IMPRESSION: 1. No acute intracranial abnormality. 2. Signs of atrophy and chronic microvascular ischemic change. Electronically Signed   By: Zetta Bills M.D.   On: 12/17/2019 09:46   CT Angio Chest PE W and/or Wo Contrast  Result Date: 12/25/2019 CLINICAL DATA:  Nausea, unsteady gait.  Weakness EXAM: CT ANGIOGRAPHY CHEST WITH CONTRAST TECHNIQUE: Multidetector CT imaging of the chest was performed using the standard protocol during bolus administration of intravenous contrast. Multiplanar CT image reconstructions and MIPs were obtained to evaluate the vascular anatomy. CONTRAST:  145mL OMNIPAQUE IOHEXOL 350 MG/ML SOLN COMPARISON:  None. FINDINGS: Cardiovascular: No filling defects within the pulmonary arteries to suggest acute pulmonary embolism. Mediastinum/Nodes: No axillary supraclavicular adenopathy. No mediastinal hilar adenopathy. Esophagus normal. No pericardial fluid. Lungs/Pleura: No pulmonary infarction. No pneumonia or pneumothorax. In the a peripheral aspect of the LEFT lower lobe 5 mm nodule (image 72/5). Elongated nodule in the RIGHT lower lobe measures 7 mm (image 61/5). Upper Abdomen: Limited view of the liver, kidneys, pancreas are unremarkable. Normal adrenal glands. Musculoskeletal: Thickened paraspinal tissue of the upper thoracic spine related to prior discitis and surgical  intervention. Posterior fusion of the thoracolumbar spine. The most superior pedicle screw on the RIGHT is fractured. Fracture present on exam from 07/26/2019. no acute findings Review of the MIP images confirms the above findings. IMPRESSION: 1. No evidence acute pulmonary embolism. 2. Lower lobe pulmonary nodules. Non-contrast chest CT at 6-12 months is recommended. If the nodule is stable at time of repeat CT, then future CT at 18-24 months (from today's scan) is considered optional for low-risk patients, but is recommended for high-risk patients. This recommendation follows the consensus statement: Guidelines for Management of Incidental Pulmonary Nodules Detected on CT Images: From the Fleischner Society 2017; Radiology 2017; 284:228-243. 3. Posterior lumbar fusion with sequelae of discitis osteomyelitis as well as paraspinal soft tissue thickening not appreciably changed from comparison CT and MRIs. 4. Fracture of the most superior RIGHT pedicle screw present on 07/26/2019. Electronically Signed   By: Suzy Bouchard M.D.   On: 12/25/2019 20:58   CT Abdomen Pelvis W Contrast  Result Date: 12/27/2019 CLINICAL DATA:  Nausea and vomiting. Change in bowel habits. 24 lb  weight loss in past 3 months. EXAM: CT ABDOMEN AND PELVIS WITH CONTRAST TECHNIQUE: Multidetector CT imaging of the abdomen and pelvis was performed using the standard protocol following bolus administration of intravenous contrast. CONTRAST:  146mL OMNIPAQUE IOHEXOL 300 MG/ML  SOLN COMPARISON:  None. FINDINGS: Lower Chest: No acute findings. Hepatobiliary: No hepatic masses identified. Focal fatty infiltration seen adjacent to the falciform ligament. Probable tiny gallstones. No evidence of cholecystitis. Diffuse biliary ductal dilatation is seen, with common bile duct measuring 13 mm. Pancreas: No mass or inflammatory changes. No evidence of pancreatic ductal dilatation. Spleen: Within normal limits in size and appearance. Adrenals/Urinary  Tract: No masses identified. No evidence of ureteral calculi or hydronephrosis. Stomach/Bowel: No evidence of obstruction, inflammatory process or abnormal fluid collections. Vascular/Lymphatic: No pathologically enlarged lymph nodes. No abdominal aortic aneurysm. Aortic atherosclerosis noted. Reproductive:  No mass or other significant abnormality. Other:  None. Musculoskeletal: No suspicious bone lesions identified. Thoracolumbar spine fusion hardware noted. IMPRESSION: 1. Probable tiny gallstones. No radiographic evidence of cholecystitis. 2. Diffuse biliary ductal dilatation. No etiology apparent by CT. Recommend correlation with liver function tests, and consider abdomen MRI and MRCP without and with contrast for further evaluation. Aortic Atherosclerosis (ICD10-I70.0). Electronically Signed   By: Marlaine Hind M.D.   On: 12/27/2019 10:36   MR 3D Recon At Scanner  Result Date: 01/08/2020 CLINICAL DATA:  Cholelithiasis. Weight loss and recurrent vomiting for 3 months. EXAM: MRI ABDOMEN WITHOUT AND WITH CONTRAST (INCLUDING MRCP) TECHNIQUE: Multiplanar multisequence MR imaging of the abdomen was performed both before and after the administration of intravenous contrast. Heavily T2-weighted images of the biliary and pancreatic ducts were obtained, and three-dimensional MRCP images were rendered by post processing. CONTRAST:  32mL GADAVIST GADOBUTROL 1 MMOL/ML IV SOLN COMPARISON:  Abdominopelvic CT 12/26/2019 FINDINGS: Lower chest:  The visualized lower chest appears unremarkable. Hepatobiliary: No morphologic changes of hepatic cirrhosis or significant hepatic steatosis. No focal hepatic lesion or abnormal enhancement seen following contrast. As demonstrated on recent CT, there is mild intra and extrahepatic biliary dilatation. The common hepatic duct measures 14 mm maximally, and tapers distally. No evidence of choledocholithiasis. The gallbladder appears normal without stones, wall thickening or surrounding  inflammation. Pancreas: No evidence of pancreatic mass, ductal dilatation or variant ductal anatomy. No surrounding inflammatory changes or fluid collections. Spleen: Normal in size without focal abnormality. Adrenals/Urinary Tract: Both adrenal glands appear normal. Probable tiny bilateral renal cysts. No evidence of renal mass or hydronephrosis. Stomach/Bowel: No evidence of bowel wall thickening, distention or surrounding inflammatory change. Vascular/Lymphatic: There are no enlarged abdominal lymph nodes. No significant vascular findings. Other: No ascites.  The visualized abdominal wall is intact. Musculoskeletal: Extensive postsurgical changes from previous thoracolumbar fusion and decompression. No acute osseous findings. 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 IMPRESSION: 1. Stable mild intra and extrahepatic biliary dilatation compared with CT of 2 weeks ago. No evidence of choledocholithiasis or other specific explanation. Given minimal elevation of just the patient's indirect bilirubin level, this is of uncertain significance. Continued follow-up of the patient's LFTs recommended. 2. The gallbladder appears normal. No morphologic changes of hepatic cirrhosis or significant hepatic steatosis. 3. No acute abdominal findings. Electronically Signed   By: Richardean Sale M.D.   On: 01/08/2020 13:01   DG Chest Portable 1 View  Result Date: 01/07/2020 CLINICAL DATA:  Weight loss. EXAM: PORTABLE CHEST 1 VIEW COMPARISON:  02/08/2019. FINDINGS: Mediastinum and hilar structures normal. Heart size normal. No focal infiltrate. No pleural effusion or pneumothorax. Prior thoracic spine fusion. IMPRESSION:  No acute cardiopulmonary disease. Electronically Signed   By: Marcello Moores  Register   On: 01/07/2020 13:03   MR ABDOMEN MRCP W WO CONTAST  Result Date: 01/08/2020 CLINICAL DATA:  Cholelithiasis. Weight loss and recurrent vomiting for 3 months. EXAM: MRI ABDOMEN WITHOUT AND WITH CONTRAST (INCLUDING MRCP) TECHNIQUE:  Multiplanar multisequence MR imaging of the abdomen was performed both before and after the administration of intravenous contrast. Heavily T2-weighted images of the biliary and pancreatic ducts were obtained, and three-dimensional MRCP images were rendered by post processing. CONTRAST:  79mL GADAVIST GADOBUTROL 1 MMOL/ML IV SOLN COMPARISON:  Abdominopelvic CT 12/26/2019 FINDINGS: Lower chest:  The visualized lower chest appears unremarkable. Hepatobiliary: No morphologic changes of hepatic cirrhosis or significant hepatic steatosis. No focal hepatic lesion or abnormal enhancement seen following contrast. As demonstrated on recent CT, there is mild intra and extrahepatic biliary dilatation. The common hepatic duct measures 14 mm maximally, and tapers distally. No evidence of choledocholithiasis. The gallbladder appears normal without stones, wall thickening or surrounding inflammation. Pancreas: No evidence of pancreatic mass, ductal dilatation or variant ductal anatomy. No surrounding inflammatory changes or fluid collections. Spleen: Normal in size without focal abnormality. Adrenals/Urinary Tract: Both adrenal glands appear normal. Probable tiny bilateral renal cysts. No evidence of renal mass or hydronephrosis. Stomach/Bowel: No evidence of bowel wall thickening, distention or surrounding inflammatory change. Vascular/Lymphatic: There are no enlarged abdominal lymph nodes. No significant vascular findings. Other: No ascites.  The visualized abdominal wall is intact. Musculoskeletal: Extensive postsurgical changes from previous thoracolumbar fusion and decompression. No acute osseous findings. 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 IMPRESSION: 1. Stable mild intra and extrahepatic biliary dilatation compared with CT of 2 weeks ago. No evidence of choledocholithiasis or other specific explanation. Given minimal elevation of just the patient's indirect bilirubin level, this is of uncertain significance. Continued  follow-up of the patient's LFTs recommended. 2. The gallbladder appears normal. No morphologic changes of hepatic cirrhosis or significant hepatic steatosis. 3. No acute abdominal findings. Electronically Signed   By: Richardean Sale M.D.   On: 01/08/2020 13:01    Microbiology: Recent Results (from the past 240 hour(s))  SARS Coronavirus 2 by RT PCR (hospital order, performed in [facility] hospital lab) Nasopharyngeal Nasopharyngeal Swab     Status: None   Collection Time: 01/07/20  4:11 PM   Specimen: Nasopharyngeal Swab  Result Value Ref Range Status   SARS Coronavirus 2 NEGATIVE NEGATIVE Final    Comment: (NOTE) SARS-CoV-2 target nucleic acids are NOT DETECTED.  The SARS-CoV-2 RNA is generally detectable in upper and lower respiratory specimens during the acute phase of infection. The lowest concentration of SARS-CoV-2 viral copies this assay can detect is 250 copies / mL. A negative result does not preclude SARS-CoV-2 infection and should not be used as the sole basis for treatment or other patient management decisions.  A negative result may occur with improper specimen collection / handling, submission of specimen other than nasopharyngeal swab, presence of viral mutation(s) within the areas targeted by this assay, and inadequate number of viral copies (<250 copies / mL). A negative result must be combined with clinical observations, patient history, and epidemiological information.  Fact Sheet for Patients:   StrictlyIdeas.no  Fact Sheet for Healthcare Providers: BankingDealers.co.za  This test is not yet approved or  cleared by the [place] FDA and has been authorized for detection and/or diagnosis of SARS-CoV-2 by FDA under an Emergency Use Authorization (EUA).  This EUA will remain in effect (meaning this test can be used)  for the duration of the COVID-19 declaration under Section 564(b)(1) of the Act, 21 U.S.C. section  360bbb-3(b)(1), unless the authorization is terminated or revoked sooner.  Performed at [facility], [address], [place], [place] [address]   Surgical pcr screen     Status: None   Collection Time: 01/09/20  3:19 AM   Specimen: Nasal Mucosa; Nasal Swab  Result Value Ref Range Status   MRSA, PCR NEGATIVE NEGATIVE Final   Staphylococcus aureus NEGATIVE NEGATIVE Final    Comment: (NOTE) The Xpert SA Assay (FDA approved for NASAL specimens in patients 51 years of age and older), is one component of a comprehensive surveillance program. It is not intended to diagnose infection nor to guide or monitor treatment. Performed at [facility], [place] [address] [place], [place] [address]      Labs: Basic Metabolic Panel: Recent Labs  Lab 01/07/20 1259 01/08/20 0544 01/10/20 0446  NA 129* 134* 135  K 3.8 4.0 4.3  CL 92* 98 100  CO2 25 26 28   GLUCOSE 147* 117* 168*  BUN 12 11 10   CREATININE 0.76 0.74 0.64  CALCIUM 9.1 8.9 8.8*   Liver Function Tests: Recent Labs  Lab 01/07/20 1259 01/08/20 0544 01/10/20 0446  AST 24 20 14*  ALT 13 14 10   ALKPHOS 80 72 68  BILITOT 1.3* 1.5* 1.0  PROT 7.5 6.8 6.2*  ALBUMIN 4.7 4.3 4.0   Recent Labs  Lab 01/07/20 1259  LIPASE 15   CBC: Recent Labs  Lab 01/07/20 1259 01/08/20 0544 01/10/20 0446  WBC 8.4 7.3 7.0  NEUTROABS 7.1  --   --   HGB 14.4 13.5 12.6*  HCT 43.0 40.1 38.6*  MCV 90.9 88.7 92.1  PLT 318 286 274    CBG: Recent Labs  Lab 01/09/20 0732 01/09/20 1132 01/09/20 1641 01/09/20 2202 01/10/20 0745  GLUCAP 126* 125* 225* 183* 140*    Principal Problem:   Weight loss Active Problems:   Diabetes mellitus without complication (HCC)   BPH (benign prostatic hyperplasia)   Nausea and vomiting   Protein-calorie malnutrition, severe   Time coordinating discharge: 40 minutes  Signed:  Murray Hodgkins, MD  [facility]  01/10/2020, 11:49 AM

## 2020-01-10 NOTE — Care Management Important Message (Signed)
Important Message  Patient Details IM Letter given to Kathrin Greathouse SW Case Manager to present to the Patient Name: Luis Brown MRN: [id number] Date of Birth: 1946/06/28   Medicare Important Message Given:  Yes     Kerin Salen 01/10/2020, 12:28 PM

## 2020-01-10 NOTE — TOC Progression Note (Signed)
Transition of Care [facility]) - Progression Note    Patient Details  Name: JERYN CERNEY MRN: [id number] Date of Birth: December 29, 1945  Transition of Care [facility]) CM/SW [place], [place] Phone Number: 01/10/2020, 11:16 AM  Clinical Narrative:    [facility] unable to provide services.  CSW offered choice to the patient. Unfortunately patient choice unable to offer services due to staffing issues. CSW reached out to [facility]. They will start care July 1. CSW notifed the patient and spouse and bedside.  No other needs identified.   Expected Discharge Plan: [place] Barriers to Discharge: Continued Medical Work up  Expected Discharge Plan and Services Expected Discharge Plan: [place]   Discharge Planning Services: CM Consult Post Acute Care Choice: [place] arrangements for the past 2 months: Single Family Home Expected Discharge Date: 01/10/20                         HH Arranged: PT [place]: [place] Date [place]: 01/10/20 Time [place] Agency Contacted: 1115 Representative spoke with at [place]: [place] ([place]) Interventions    Readmission Risk Interventions No flowsheet data found.

## 2020-01-12 ENCOUNTER — Other Ambulatory Visit: Payer: Self-pay | Admitting: Medical

## 2020-01-16 ENCOUNTER — Other Ambulatory Visit: Payer: Self-pay | Admitting: Specialist

## 2020-01-17 ENCOUNTER — Ambulatory Visit: Payer: Medicare Other

## 2020-01-17 MED ORDER — HYDROCODONE-ACETAMINOPHEN 10-325 MG PO TABS: 1.0000 | ORAL_TABLET | Freq: Four times a day (QID) | ORAL | 0 refills | Status: DC | PRN

## 2020-01-17 NOTE — Telephone Encounter (Signed)
Nitka patient 

## 2020-01-17 NOTE — Telephone Encounter (Signed)
Please advise 

## 2020-01-22 ENCOUNTER — Other Ambulatory Visit: Payer: Self-pay

## 2020-01-22 ENCOUNTER — Ambulatory Visit (INDEPENDENT_AMBULATORY_CARE_PROVIDER_SITE_OTHER): Payer: Medicare Other | Admitting: Family Medicine

## 2020-01-22 DIAGNOSIS — E349 Endocrine disorder, unspecified: Secondary | ICD-10-CM | POA: Diagnosis not present

## 2020-01-22 MED ORDER — TESTOSTERONE CYPIONATE 200 MG/ML IM SOLN
200.0000 mg | Freq: Once | INTRAMUSCULAR | Status: AC
  Administered 2020-01-22: 200 mg via INTRAMUSCULAR

## 2020-01-22 NOTE — Progress Notes (Signed)
Pt here today for testosterone injection.   Pt supplied testosterone cypionate 200mg /mL. 59mL injected into R hip. Pt tolerated well.   Next in 14 days.   Reviewed and agree Denny Peon MD

## 2020-01-23 ENCOUNTER — Ambulatory Visit: Payer: Medicare Other | Admitting: Specialist

## 2020-01-23 ENCOUNTER — Ambulatory Visit: Payer: Self-pay

## 2020-01-23 ENCOUNTER — Encounter: Payer: Self-pay | Admitting: Specialist

## 2020-01-23 VITALS — BP 132/76 | HR 70 | Ht 70.0 in | Wt 159.0 lb

## 2020-01-23 DIAGNOSIS — M40294 Other kyphosis, thoracic region: Secondary | ICD-10-CM | POA: Diagnosis not present

## 2020-01-23 DIAGNOSIS — Z981 Arthrodesis status: Secondary | ICD-10-CM | POA: Diagnosis not present

## 2020-01-23 DIAGNOSIS — M4624 Osteomyelitis of vertebra, thoracic region: Secondary | ICD-10-CM | POA: Diagnosis not present

## 2020-01-23 DIAGNOSIS — T8484XA Pain due to internal orthopedic prosthetic devices, implants and grafts, initial encounter: Secondary | ICD-10-CM | POA: Diagnosis not present

## 2020-01-23 MED ORDER — HYDROCODONE-ACETAMINOPHEN 10-325 MG PO TABS: 1.0000 | ORAL_TABLET | Freq: Four times a day (QID) | ORAL | 0 refills | Status: DC | PRN

## 2020-01-23 NOTE — Progress Notes (Signed)
Office Visit Note   Patient: Luis Brown           Date of Birth: 04-11-1946           MRN: [id number] Visit Date: 01/23/2020              Requested by: Mackie Pai, PA-C [place],  [place] [id number] PCP: Mackie Pai, PA-C   Assessment & Plan: Visit Diagnoses:  1. Status post thoracic spinal fusion   2. Painful orthopaedic hardware ([place])   3. Other kyphosis, thoracic region   4. 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[facility])   5. Arthrodesis status     Plan: Avoid frequent bending and stooping  No lifting greater than 10 lbs. May use ice or moist heat for pain. Weight loss is of benefit. Discontinue your NSAIDs arthritis medications like motrin, celebrex and naprosyn. Exercise is important to improve your indurance and does allow people to function better inspite of back pain. Walking is your best exercise or pool walking. Exercise bicycle.   Hemp CBD capsules, amazon.com 5,000-7,000 mg per bottle, 60 capsules per bottle, take one capsule twice a day.   Follow-Up Instructions: No follow-ups on file.   Orders:  Orders Placed This Encounter  Procedures  . XR Thoracic Spine 2 View  . Ambulatory referral to Pain Clinic   Meds ordered this encounter  Medications  . HYDROcodone-acetaminophen (NORCO) 10-325 MG tablet    Sig: Take 1-2 tablets by mouth every 6 (six) hours as needed for severe pain.    Dispense:  30 tablet    Refill:  0      Procedures: No procedures performed   Clinical Data: No additional findings.   Subjective: Chief Complaint  Patient presents with  . Spine - Follow-up    74 year old male post fong thoracolumbar fusion with revision of hardware in late August 2020. No bowel or bladder difficulty. Recently admitted to [facility] and had endoscopic exam. Found to have numerous ulcers Esophagus, stomach and small bowel. Over all he is feeling better with treatment of these ulcers and stopping  NSAIDs.   Review of Systems  Constitutional: Negative.   HENT: Negative.   Eyes: Negative.   Respiratory: Negative.   Cardiovascular: Negative.   Gastrointestinal: Negative.   Endocrine: Negative.   Genitourinary: Negative.   Musculoskeletal: Negative.   Skin: Negative.   Allergic/Immunologic: Negative.   Neurological: Negative.   Hematological: Negative.   Psychiatric/Behavioral: Negative.      Objective: Vital Signs: BP 132/76 (BP Location: Left Arm, Patient Position: Sitting)   Pulse 70   Ht 5\' 10"  (1.778 m)   Wt 159 lb (72.1 kg)   BMI 22.81 kg/m   Physical Exam Constitutional:      Appearance: He is well-developed.  HENT:     Head: Normocephalic and atraumatic.  Eyes:     Pupils: Pupils are equal, round, and reactive to light.  Pulmonary:     Effort: Pulmonary effort is normal.     Breath sounds: Normal breath sounds.  Abdominal:     General: Bowel sounds are normal.     Palpations: Abdomen is soft.  Musculoskeletal:        General: Normal range of motion.     Cervical back: Normal range of motion and neck supple.     Lumbar back: Negative right straight leg raise test and negative left straight leg raise test.  Skin:    General: Skin is warm  and dry.  Neurological:     Mental Status: He is alert and oriented to person, place, and time.  Psychiatric:        Behavior: Behavior normal.        Thought Content: Thought content normal.        Judgment: Judgment normal.     Back Exam   Tenderness  The patient is experiencing tenderness in the lumbar.  Range of Motion  Extension: normal  Flexion: normal  Lateral bend right: normal  Lateral bend left: normal  Rotation right: normal  Rotation left: normal   Muscle Strength  Right Quadriceps:  5/5  Left Quadriceps:  5/5  Right Hamstrings:  5/5  Left Hamstrings:  5/5   Tests  Straight leg raise right: negative Straight leg raise left: negative  Reflexes  Patellar: 2/4 Achilles:  2/4      Specialty Comments:  No specialty comments available.  Imaging: No results found.   PMFS History: Patient Active Problem List   Diagnosis Date Noted  . Protein-calorie malnutrition, severe 01/09/2020  . Weight loss 01/07/2020  . Abdominal pain 12/30/2019  . Nausea and vomiting 12/30/2019  . Weight loss, abnormal 12/30/2019  . Change in bowel habits 12/30/2019  . 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 Medication monitoring encounter 08/26/2019  . Peripheral neuropathy 08/07/2019  . Alcohol abuse 05/09/2019  . Hyponatremia 05/05/2019  . 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[place]) 04/04/2019  . Fusion of spine of thoracolumbar region 03/12/2019  . Status post thoracic spinal fusion 02/12/2019  . 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 Thoracic spinal stenosis 04/17/2017  . Painful orthopaedic hardware ([place]) 04/17/2017  . 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 ([place]) 04/17/2017  . Low back pain radiating to both legs 01/16/2017  . Esophageal reflux 02/10/2015  . Medicare annual wellness visit, subsequent 02/10/2015  . Personal history of colonic polyps 10/27/2012  . 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  . BPH (benign prostatic hyperplasia) 04/23/2012  . ED (erectile dysfunction) 04/23/2012  . Allergic state 10/25/2011  . Anxiety and depression 10/25/2011  . Asthma   . Diabetes mellitus without complication ([place])   . Hyperlipidemia   . Overweight   . Insomnia   . Fatigue   . Preventative health care   . DDD (degenerative disc disease), lumbosacral   . Thoracic degenerative disc disease   . HTN (hypertension)    Past Medical History:  Diagnosis Date  . Acute pharyngitis 10/21/2013  . Allergy    grass and pollen  . Anxiety and depression 10/25/2011  . BPH (benign prostatic hyperplasia) 04/23/2012  . Chicken pox as a child  . DDD (degenerative disc disease)    cervical responds to  steroid injections and low back required surgery  . DDD (degenerative disc disease), lumbosacral   . Diabetes mellitus    pre  . Dyspnea   . ED (erectile dysfunction) 04/23/2012  . Elevated BP   . 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 Esophageal reflux 02/10/2015  . Fatigue   . HTN (hypertension)   . Hyperglycemia    preDM   . Hyperlipidemia   . Insomnia   . Low back pain radiating to both legs 01/16/2017  . Measles as a child  . Medicare annual wellness visit, subsequent 02/10/2015  . Overweight(278.02)   . Peripheral neuropathy 08/07/2019  . Personal history of colonic polyps 10/27/2012   Follows with [facility]  . Pneumonia    " walking"  .  Preventative health care   . 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  . Wears glasses     Family History  Problem Relation Age of Onset  . Hypertension Mother   . Diabetes Mother        type 2  . Cancer Mother 30       breast in remission  . Emphysema Father        smoker  . COPD Father        smoker  . Stroke Father 44       mini  . Heart disease Father   . Hypertension Sister   . Hyperlipidemia Sister   . Scoliosis Sister   . Osteoporosis Sister   . Hypertension Maternal Grandmother   . Scoliosis Maternal Grandmother   . Heart disease Maternal Grandfather   . Heart disease Paternal Grandfather        smoker  . Heart disease Daughter     Past Surgical History:  Procedure Laterality Date  . BACK SURGERY  2012 and 1994   Dr Margret Chance, screws and cage in low back  . BIOPSY  01/09/2020   Procedure: BIOPSY;  Surgeon: Otis Brace, MD;  Location: [facility];  Service: Gastroenterology;;  . COLONOSCOPY WITH PROPOFOL N/A 01/09/2020   Procedure: COLONOSCOPY WITH PROPOFOL;  Surgeon: Otis Brace, MD;  Location: [facility];  Service: Gastroenterology;  Laterality: N/A;  . ESOPHAGOGASTRODUODENOSCOPY (EGD) WITH PROPOFOL N/A 01/09/2020   Procedure: ESOPHAGOGASTRODUODENOSCOPY (EGD) WITH PROPOFOL;  Surgeon: Otis Brace, MD;   Location: [facility];  Service: Gastroenterology;  Laterality: N/A;  . EYE SURGERY Bilateral    2016  . HARDWARE REVISION  03/12/2019   REVISION OF HARDWARE THORACIC TEN-THORACIC ELEVEN WITH APPLICATION OF ADDITIONAL RODS AND ROD SLEEVES THORACIC EIGHT-THORACIC TWELVE 12-LUMBAR ONE (N/A )  . LAMINECTOMY  02/12/2019   DECOMPRESSIVE LAMINECTOMY THORACIC NINE-THORACIC TEN AND THORACIC TEN-THORACIC ELEVEN, EXTENSION OF THORACOLUMBAR FUSION FROM THORACIC TEN TO THORACIC FIVE, LOCAL BONE GRAFT, ALLOGRAFT AND VIVIGEN (N/A)  . POLYPECTOMY  01/09/2020   Procedure: POLYPECTOMY;  Surgeon: Otis Brace, MD;  Location: [facility];  Service: Gastroenterology;;  . SPINAL FUSION N/A 02/12/2019   Procedure: DECOMPRESSIVE LAMINECTOMY THORACIC NINE-THORACIC TEN AND THORACIC TEN-THORACIC ELEVEN, EXTENSION OF THORACOLUMBAR FUSION FROM THORACIC TEN TO THORACIC FIVE, LOCAL BONE GRAFT, ALLOGRAFT AND VIVIGEN;  Surgeon: Jessy Oto, MD;  Location: [place];  Service: Orthopedics;  Laterality: N/A;  DECOMPRESSIVE LAMINECTOMY THORACIC NINE-THORACIC TEN AND THORACIC TEN-THORACIC ELEVEN, EXTENSION OF TH  . SPINAL FUSION N/A 03/12/2019   Procedure: REVISION OF HARDWARE THORACIC TEN-THORACIC ELEVEN WITH APPLICATION OF ADDITIONAL RODS AND ROD SLEEVES THORACIC EIGHT-THORACIC TWELVE 12-LUMBAR ONE;  Surgeon: Jessy Oto, MD;  Location: [place];  Service: Orthopedics;  Laterality: N/A;  . TONSILLECTOMY    . TONSILLECTOMY     as child  . torn rotator cuff  2010   right   Social History   Occupational History  . Not on file  Tobacco Use  . Smoking status: Former Smoker    Packs/day: 1.00    Years: 20.00    Pack years: 20.00    Types: Cigarettes    Quit date: 07/18/1989    Years since quitting: 30.5  . Smokeless tobacco: Never Used  . Tobacco comment: off and on smoking for the 20 yrs  Vaping Use  . Vaping Use: Never used  Substance and Sexual Activity  . Alcohol use: Yes    Comment: drink or two a day- mixed  drink  . Drug use: No  .  Sexual activity: Yes    Comment: lives with wife, still working, no dietary restrictions, continues to exercise intermittently

## 2020-01-23 NOTE — Patient Instructions (Signed)
Plan: Avoid frequent bending and stooping  No lifting greater than 10 lbs. May use ice or moist heat for pain. Weight loss is of benefit. Discontinue your NSAIDs arthritis medications like motrin, celebrex and naprosyn. Exercise is important to improve your indurance and does allow people to function better inspite of back pain. Walking is your best exercise or pool walking. Exercise bicycle.   Hemp CBD capsules, amazon.com 5,000-7,000 mg per bottle, 60 capsules per bottle, take one capsule twice a day.  Follow-Up Instructions: No follow-ups on file.

## 2020-01-24 ENCOUNTER — Ambulatory Visit: Payer: Medicare Other | Admitting: Family Medicine

## 2020-01-24 ENCOUNTER — Telehealth (INDEPENDENT_AMBULATORY_CARE_PROVIDER_SITE_OTHER): Payer: Medicare Other | Admitting: Family Medicine

## 2020-01-24 ENCOUNTER — Other Ambulatory Visit: Payer: Self-pay | Admitting: Gastroenterology

## 2020-01-24 ENCOUNTER — Other Ambulatory Visit: Payer: Self-pay | Admitting: Specialist

## 2020-01-24 ENCOUNTER — Other Ambulatory Visit: Payer: Self-pay

## 2020-01-24 DIAGNOSIS — E119 Type 2 diabetes mellitus without complications: Secondary | ICD-10-CM

## 2020-01-24 DIAGNOSIS — K838 Other specified diseases of biliary tract: Secondary | ICD-10-CM

## 2020-01-24 DIAGNOSIS — K259 Gastric ulcer, unspecified as acute or chronic, without hemorrhage or perforation: Secondary | ICD-10-CM | POA: Diagnosis not present

## 2020-01-24 DIAGNOSIS — R634 Abnormal weight loss: Secondary | ICD-10-CM | POA: Diagnosis not present

## 2020-01-24 DIAGNOSIS — I10 Essential (primary) hypertension: Secondary | ICD-10-CM

## 2020-01-24 DIAGNOSIS — K219 Gastro-esophageal reflux disease without esophagitis: Secondary | ICD-10-CM | POA: Diagnosis not present

## 2020-01-24 DIAGNOSIS — Z86010 Personal history of colonic polyps: Secondary | ICD-10-CM

## 2020-01-24 DIAGNOSIS — Z9889 Other specified postprocedural states: Secondary | ICD-10-CM

## 2020-01-24 DIAGNOSIS — R1084 Generalized abdominal pain: Secondary | ICD-10-CM

## 2020-01-24 DIAGNOSIS — R112 Nausea with vomiting, unspecified: Secondary | ICD-10-CM

## 2020-01-24 MED ORDER — HYDROCODONE-ACETAMINOPHEN 10-325 MG PO TABS: 1.0000 | ORAL_TABLET | Freq: Four times a day (QID) | ORAL | 0 refills | Status: DC | PRN

## 2020-01-24 MED ORDER — SUCRALFATE 1 G PO TABS: 1.0000 g | ORAL_TABLET | Freq: Three times a day (TID) | ORAL | 2 refills | Status: DC

## 2020-01-24 NOTE — Telephone Encounter (Signed)
Nitka pt 

## 2020-01-27 ENCOUNTER — Other Ambulatory Visit: Payer: Self-pay | Admitting: Medical

## 2020-01-27 DIAGNOSIS — K259 Gastric ulcer, unspecified as acute or chronic, without hemorrhage or perforation: Secondary | ICD-10-CM | POA: Insufficient documentation

## 2020-01-27 DIAGNOSIS — Z9889 Other specified postprocedural states: Secondary | ICD-10-CM | POA: Insufficient documentation

## 2020-01-27 NOTE — Assessment & Plan Note (Signed)
hgba1c acceptable, minimize simple carbs. Increase exercise as tolerated. Continue current meds 

## 2020-01-27 NOTE — Assessment & Plan Note (Signed)
He was recently hospitalized and scoped and polyps were found and removed.

## 2020-01-27 NOTE — Assessment & Plan Note (Signed)
Avoid offending foods, start probiotics. Do not eat large meals in late evening and consider raising head of bed.  

## 2020-01-27 NOTE — Assessment & Plan Note (Addendum)
He is eating some better and has stopped loosing weight since he was started on increased acid suppression but he still notes some discomfort with eating. Will start on Carafate and he has follow up with gastroenterology

## 2020-01-27 NOTE — Assessment & Plan Note (Signed)
Monitor and report any concerns. no changes to meds. Encouraged heart healthy diet such as the DASH diet and exercise as tolerated.  

## 2020-01-27 NOTE — Progress Notes (Signed)
Virtual Visit via Video Note  I connected with Luis Brown on 01/24/20 at  9:40 AM EDT by a video enabled telemedicine application and verified that I am speaking with the correct person using two identifiers.  Location: Patient: home, patient and provider in visit.  Provider: home   I discussed the limitations of evaluation and management by telemedicine and the availability of in person appointments. The patient expressed understanding and agreed to proceed. Kem Boroughs, CMA was able to set the patient up with a video visit.    Subjective:    [name], male    DOB: 1946/03/06, 74 y.o.   MRN: [id number]  Chief Complaint  Patient presents with  . 3 weeks follow up    HPI Patient is in today for follow up on chronic medical concerns. He is feeling much better after his recent hospitalizations. He presented to ER after he had increased pain, reflux and increased weight loss. They scoped him and polyps, hemorrhoids, esophageal and  Gastric ulcers were found. He has been placed on increased acid suppression and he is feeling some better. He is eating better but he still gets uncomfortable when he eats. No vomiting or significant abdominal pain. Denies CP/palp/SOB/HA/congestion/fevers or GU c/o. Taking meds as prescribed  Past Medical History:  Diagnosis Date  . Acute pharyngitis 10/21/2013  . Allergy    grass and pollen  . Anxiety and depression 10/25/2011  . BPH (benign prostatic hyperplasia) 04/23/2012  . Chicken pox as a child  . DDD (degenerative disc disease)    cervical responds to steroid injections and low back required surgery  . DDD (degenerative disc disease), lumbosacral   . Diabetes mellitus    pre  . Dyspnea   . ED (erectile dysfunction) 04/23/2012  . Elevated BP   . 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 Esophageal reflux 02/10/2015  . Fatigue   . HTN (hypertension)   . Hyperglycemia    preDM   . Hyperlipidemia   . Insomnia   . Low back pain  radiating to both legs 01/16/2017  . Measles as a child  . Medicare annual wellness visit, subsequent 02/10/2015  . Overweight(278.02)   . Peripheral neuropathy 08/07/2019  . Personal history of colonic polyps 10/27/2012   Follows with [facility]  . Pneumonia    " walking"  . Preventative health care   . 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  . Wears glasses     Past Surgical History:  Procedure Laterality Date  . BACK SURGERY  2012 and 1994   Dr Margret Chance, screws and cage in low back  . BIOPSY  01/09/2020   Procedure: BIOPSY;  Surgeon: Otis Brace, MD;  Location: [facility];  Service: Gastroenterology;;  . COLONOSCOPY WITH PROPOFOL N/A 01/09/2020   Procedure: COLONOSCOPY WITH PROPOFOL;  Surgeon: Otis Brace, MD;  Location: [facility];  Service: Gastroenterology;  Laterality: N/A;  . ESOPHAGOGASTRODUODENOSCOPY (EGD) WITH PROPOFOL N/A 01/09/2020   Procedure: ESOPHAGOGASTRODUODENOSCOPY (EGD) WITH PROPOFOL;  Surgeon: Otis Brace, MD;  Location: [facility];  Service: Gastroenterology;  Laterality: N/A;  . EYE SURGERY Bilateral    2016  . HARDWARE REVISION  03/12/2019   REVISION OF HARDWARE THORACIC TEN-THORACIC ELEVEN WITH APPLICATION OF ADDITIONAL RODS AND ROD SLEEVES THORACIC EIGHT-THORACIC TWELVE 12-LUMBAR ONE (N/A )  . LAMINECTOMY  02/12/2019   DECOMPRESSIVE LAMINECTOMY THORACIC NINE-THORACIC TEN AND THORACIC TEN-THORACIC ELEVEN, EXTENSION OF THORACOLUMBAR FUSION FROM THORACIC TEN TO THORACIC FIVE, LOCAL BONE GRAFT, ALLOGRAFT AND VIVIGEN (N/A)  . POLYPECTOMY  01/09/2020   Procedure: POLYPECTOMY;  Surgeon: Otis Brace, MD;  Location: [facility];  Service: Gastroenterology;;  . SPINAL FUSION N/A 02/12/2019   Procedure: DECOMPRESSIVE LAMINECTOMY THORACIC NINE-THORACIC TEN AND THORACIC TEN-THORACIC ELEVEN, EXTENSION OF THORACOLUMBAR FUSION FROM THORACIC TEN TO THORACIC FIVE, LOCAL BONE GRAFT, ALLOGRAFT AND VIVIGEN;  Surgeon: Jessy Oto, MD;  Location: [facility];  Service: Orthopedics;  Laterality: N/A;  DECOMPRESSIVE LAMINECTOMY THORACIC NINE-THORACIC TEN AND THORACIC TEN-THORACIC ELEVEN, EXTENSION OF TH  . SPINAL FUSION N/A 03/12/2019   Procedure: REVISION OF HARDWARE THORACIC TEN-THORACIC ELEVEN WITH APPLICATION OF ADDITIONAL RODS AND ROD SLEEVES THORACIC EIGHT-THORACIC TWELVE 12-LUMBAR ONE;  Surgeon: Jessy Oto, MD;  Location: [facility];  Service: Orthopedics;  Laterality: N/A;  . TONSILLECTOMY    . TONSILLECTOMY     as child  . torn rotator cuff  2010   right    Family History  Problem Relation Age of Onset  . Hypertension Mother   . Diabetes Mother        type 2  . Cancer Mother 16       breast in remission  . Emphysema Father        smoker  . COPD Father        smoker  . Stroke Father 50       mini  . Heart disease Father   . Hypertension Sister   . Hyperlipidemia Sister   . Scoliosis Sister   . Osteoporosis Sister   . Hypertension Maternal Grandmother   . Scoliosis Maternal Grandmother   . Heart disease Maternal Grandfather   . Heart disease Paternal Grandfather        smoker  . Heart disease Daughter     Social History   Socioeconomic History  . Marital status: Married    Spouse name: Not on file  . Number of children: Not on file  . Years of education: Not on file  . Highest education level: Not on file  Occupational History  . Not on file  Tobacco Use  . Smoking status: Former Smoker    Packs/day: 1.00    Years: 20.00    Pack years: 20.00    Types: Cigarettes    Quit date: 07/18/1989    Years since quitting: 30.5  . Smokeless tobacco: Never Used  . Tobacco comment: off and on smoking for the 20 yrs  Vaping Use  . Vaping Use: Never used  Substance and Sexual Activity  . Alcohol use: Yes    Comment: drink or two a day- mixed drink  . Drug use: No  . Sexual activity: Yes    Comment: lives with wife, still working, no dietary restrictions, continues to exercise intermittently  Other Topics Concern  .  Not on file  Social History Narrative  . Not on file   Social Determinants of Health   Financial Resource Strain: Low Risk   . Difficulty of Paying Living Expenses: Not hard at all  Food Insecurity:   . Worried About Charity fundraiser in the Last Year:   . Arboriculturist in the Last Year:   Transportation Needs: No Transportation Needs  . Lack of Transportation (Medical): No  . Lack of Transportation (Non-Medical): No  Physical Activity:   . Days of Exercise per Week:   . Minutes of Exercise per Session:   Stress:   . Feeling of Stress :   Social Connections:   . Frequency of Communication with Friends and Family:   .  Frequency of Social Gatherings with Friends and Family:   . Attends Religious Services:   . Active Member of Clubs or Organizations:   . Attends Archivist Meetings:   Marland Kitchen Marital Status:   Intimate Partner Violence:   . Fear of Current or Ex-Partner:   . Emotionally Abused:   Marland Kitchen Physically Abused:   . Sexually Abused:     Outpatient Medications Prior to Visit  Medication Sig Dispense Refill  . atorvastatin (LIPITOR) 20 MG tablet TAKE 1/2 TABLET BY MOUTH DAILY AT 6 PM (Patient taking differently: Take 10 mg by mouth daily. ) 45 tablet 0  . busPIRone (BUSPAR) 7.5 MG tablet 1-2 TAB BY MOUTH 3 TIMES A DAY FOR ANXIETY (Patient taking differently: Take 7.5 mg by mouth 3 (three) times daily. ) 540 tablet 1  . cetirizine (ZYRTEC) 10 MG tablet Take 10 mg by mouth daily. In the morning    . diazepam (VALIUM) 10 MG tablet Take 0.5 tablets (5 mg total) by mouth at bedtime.    Marland Kitchen FERRETTS 325 (106 Fe) MG TABS tablet TAKE 1 TABLET (106 MG OF IRON TOTAL) BY MOUTH DAILY. (Patient taking differently: Take 1 tablet by mouth daily. ) 30 tablet 2  . glucose blood (CONTOUR NEXT TEST) test strip Check blood sugar once daily 100 each 12  . lisinopril (ZESTRIL) 5 MG tablet Take 0.5 tablets (2.5 mg total) by mouth daily.    . metFORMIN (GLUCOPHAGE) 1000 MG tablet Take 1  tablet (1,000 mg total) by mouth 2 (two) times daily with a meal. 60 tablet 0  . Multiple Vitamin (MULTIVITAMIN WITH MINERALS) TABS tablet Take 1 tablet by mouth daily.    . ondansetron (ZOFRAN) 4 MG tablet Take 1 tablet (4 mg total) by mouth every 8 (eight) hours as needed for nausea or vomiting. 20 tablet 0  . pantoprazole (PROTONIX) 40 MG tablet Take 1 tablet (40 mg total) by mouth 2 (two) times daily before a meal. 60 tablet 1  . pregabalin (LYRICA) 75 MG capsule Take 1 capsule (75 mg total) by mouth 2 (two) times daily. 60 capsule 3  . sitaGLIPtin (JANUVIA) 50 MG tablet Take 1 tablet (50 mg total) by mouth daily. 30 tablet 3  . tamsulosin (FLOMAX) 0.4 MG CAPS capsule Take 2 capsules (0.8 mg total) by mouth daily. 180 capsule 0  . testosterone cypionate (DEPOTESTOSTERONE CYPIONATE) 200 MG/ML injection Inject 1 mL (200 mg total) into the muscle every 14 (fourteen) days. 2 mL 5  . tiZANidine (ZANAFLEX) 4 MG tablet Take 1.5 tablets (6 mg total) by mouth every 6 (six) hours as needed for muscle spasms. 90 tablet 1  . doxycycline (VIBRA-TABS) 100 MG tablet Take 1 tablet (100 mg total) by mouth 2 (two) times daily. 60 tablet 5  . HYDROcodone-acetaminophen (NORCO) 10-325 MG tablet Take 1-2 tablets by mouth every 6 (six) hours as needed for severe pain. 30 tablet 0  . venlafaxine XR (EFFEXOR-XR) 150 MG 24 hr capsule Take 1 capsule (150 mg total) by mouth daily with breakfast. 90 capsule 0  . Ensure Max Protein (ENSURE MAX PROTEIN) LIQD Take 330 mLs (11 oz total) by mouth daily.    . feeding supplement, ENSURE ENLIVE, (ENSURE ENLIVE) LIQD Take 237 mLs by mouth 2 (two) times daily between meals.     No facility-administered medications prior to visit.    No Known Allergies  Review of Systems  Constitutional: Positive for malaise/fatigue. Negative for fever.  HENT: Negative for congestion.   Eyes:  Negative for blurred vision.  Respiratory: Negative for shortness of breath.   Cardiovascular:  Negative for chest pain, palpitations and leg swelling.  Gastrointestinal: Positive for nausea. Negative for abdominal pain, blood in stool and vomiting.  Genitourinary: Negative for dysuria and frequency.  Musculoskeletal: Negative for falls.  Skin: Negative for rash.  Neurological: Negative for dizziness, loss of consciousness and headaches.  Endo/Heme/Allergies: Negative for environmental allergies.  Psychiatric/Behavioral: Negative for depression. The patient is not nervous/anxious.        Objective:    Physical Exam Constitutional:      Appearance: Normal appearance. He is not ill-appearing.  HENT:     Head: Normocephalic and atraumatic.     Right Ear: External ear normal.     Left Ear: External ear normal.     Nose: Nose normal.  Eyes:     General:        Right eye: No discharge.        Left eye: No discharge.  Pulmonary:     Effort: Pulmonary effort is normal.  Neurological:     Mental Status: He is alert and oriented to person, place, and time.  Psychiatric:        Behavior: Behavior normal.     BP 125/74   Pulse 70  Wt Readings from Last 3 Encounters:  01/23/20 159 lb (72.1 kg)  01/09/20 159 lb (72.1 kg)  12/26/19 170 lb (77.1 kg)    Diabetic Foot Exam - Simple   No data filed     Lab Results  Component Value Date   WBC 7.0 01/10/2020   HGB 12.6 (L) 01/10/2020   HCT 38.6 (L) 01/10/2020   PLT 274 01/10/2020   GLUCOSE 168 (H) 01/10/2020   CHOL 151 08/17/2018   TRIG 78.0 08/17/2018   HDL 64.10 08/17/2018   LDLDIRECT 64.0 10/30/2017   LDLCALC 71 08/17/2018   ALT 10 01/10/2020   AST 14 (L) 01/10/2020   NA 135 01/10/2020   K 4.3 01/10/2020   CL 100 01/10/2020   CREATININE 0.64 01/10/2020   BUN 10 01/10/2020   CO2 28 01/10/2020   TSH 3.42 10/08/2019   PSA 0.65 10/08/2019   INR 1.1 03/12/2019   HGBA1C 7.0 (H) 10/08/2019   MICROALBUR 0.61 04/23/2012    Lab Results  Component Value Date   TSH 3.42 10/08/2019   Lab Results  Component Value  Date   WBC 7.0 01/10/2020   HGB 12.6 (L) 01/10/2020   HCT 38.6 (L) 01/10/2020   MCV 92.1 01/10/2020   PLT 274 01/10/2020   Lab Results  Component Value Date   NA 135 01/10/2020   K 4.3 01/10/2020   CO2 28 01/10/2020   GLUCOSE 168 (H) 01/10/2020   BUN 10 01/10/2020   CREATININE 0.64 01/10/2020   BILITOT 1.0 01/10/2020   ALKPHOS 68 01/10/2020   AST 14 (L) 01/10/2020   ALT 10 01/10/2020   PROT 6.2 (L) 01/10/2020   ALBUMIN 4.0 01/10/2020   CALCIUM 8.8 (L) 01/10/2020   ANIONGAP 7 01/10/2020   GFR 119.33 12/26/2019   Lab Results  Component Value Date   CHOL 151 08/17/2018   Lab Results  Component Value Date   HDL 64.10 08/17/2018   Lab Results  Component Value Date   LDLCALC 71 08/17/2018   Lab Results  Component Value Date   TRIG 78.0 08/17/2018   Lab Results  Component Value Date   CHOLHDL 2 08/17/2018   Lab Results  Component Value Date  HGBA1C 7.0 (H) 10/08/2019       Assessment & Plan:   Problem List Items Addressed This Visit    HTN (hypertension)    Monitor and report any concerns no changes to meds. Encouraged heart healthy diet such as the DASH diet and exercise as tolerated.       Diabetes mellitus without complication (HCC)    CHYI5O acceptable, minimize simple carbs. Increase exercise as tolerated. Continue current meds      Esophageal reflux    Avoid offending foods, start probiotics. Do not eat large meals in late evening and consider raising head of bed.       Relevant Medications   sucralfate (CARAFATE) 1 g tablet   Weight loss, abnormal    He is eating some better and has stopped loosing weight since he was started on increased acid suppression but he still notes some discomfort with eating. Will start on Carafate and he has follow up with gastroenterology      Gastric ulcer    After increased pain and weight loss he resented to hospital and endoscopy showed gastric and esophageal ulcers. He was started on acid suppression and he  sees gastroenterology soon. Started on Carafate qid as well.         I have discontinued [employer] E[employer] Lofquist "Ike"'s doxycycline, Ensure Max Protein, and feeding supplement (ENSURE ENLIVE). I am also having him start on sucralfate. Additionally, I am having him maintain his cetirizine, Contour Next Test, atorvastatin, pregabalin, testosterone cypionate, tamsulosin, sitaGLIPtin, busPIRone, tiZANidine, Ferretts, pantoprazole, multivitamin with minerals, lisinopril, ondansetron, diazepam, and metFORMIN.  Meds ordered this encounter  Medications  . sucralfate (CARAFATE) 1 g tablet    Sig: Take 1 tablet (1 g total) by mouth 4 (four) times daily -  with meals and at bedtime.    Dispense:  120 tablet    Refill:  2    I discussed the assessment and treatment plan with the patient. The patient was provided an opportunity to ask questions and all were answered. The patient agreed with the plan and demonstrated an understanding of the instructions.   The patient was advised to call back or seek an in-person evaluation if the symptoms worsen or if the condition fails to improve as anticipated.  I provided 25 minutes of non-face-to-face time during this encounter.   Penni Homans, MD

## 2020-01-27 NOTE — Assessment & Plan Note (Signed)
After increased pain and weight loss he resented to hospital and endoscopy showed gastric and esophageal ulcers. He was started on acid suppression and he sees gastroenterology soon. Started on Carafate qid as well.

## 2020-01-29 ENCOUNTER — Other Ambulatory Visit: Payer: Self-pay | Admitting: Specialist

## 2020-02-05 ENCOUNTER — Ambulatory Visit (INDEPENDENT_AMBULATORY_CARE_PROVIDER_SITE_OTHER): Payer: Medicare Other | Admitting: *Deleted

## 2020-02-05 ENCOUNTER — Other Ambulatory Visit: Payer: Self-pay | Admitting: *Deleted

## 2020-02-05 ENCOUNTER — Encounter: Payer: Self-pay | Admitting: Family Medicine

## 2020-02-05 ENCOUNTER — Other Ambulatory Visit: Payer: Self-pay

## 2020-02-05 DIAGNOSIS — E349 Endocrine disorder, unspecified: Secondary | ICD-10-CM

## 2020-02-05 MED ORDER — TESTOSTERONE CYPIONATE 200 MG/ML IM SOLN
200.0000 mg | Freq: Once | INTRAMUSCULAR | Status: AC
  Administered 2020-02-05: 200 mg via INTRAMUSCULAR

## 2020-02-05 NOTE — Progress Notes (Signed)
Pt here today for testosterone injection.   Pt supplied testosterone cypionate 200mg /mL. 39mL injected into R hip. Pt tolerated well.   Next in 21 days.

## 2020-02-06 ENCOUNTER — Other Ambulatory Visit: Payer: Self-pay | Admitting: Specialist

## 2020-02-06 MED ORDER — PANTOPRAZOLE SODIUM 40 MG PO TBEC: 40.0000 mg | DELAYED_RELEASE_TABLET | Freq: Two times a day (BID) | ORAL | 1 refills | Status: DC

## 2020-02-06 NOTE — Telephone Encounter (Signed)
Luis Brown,  Please advise, thank you.

## 2020-02-07 ENCOUNTER — Other Ambulatory Visit: Payer: Self-pay | Admitting: Medical

## 2020-02-07 MED ORDER — HYDROCODONE-ACETAMINOPHEN 10-325 MG PO TABS: 1.0000 | ORAL_TABLET | Freq: Four times a day (QID) | ORAL | 0 refills | Status: DC | PRN

## 2020-02-08 ENCOUNTER — Other Ambulatory Visit: Payer: Self-pay | Admitting: Medical

## 2020-02-10 ENCOUNTER — Other Ambulatory Visit: Payer: Self-pay | Admitting: Medical

## 2020-02-14 ENCOUNTER — Other Ambulatory Visit: Payer: Self-pay

## 2020-02-14 ENCOUNTER — Ambulatory Visit: Payer: Medicare Other | Attending: Family Medicine

## 2020-02-14 DIAGNOSIS — M6281 Muscle weakness (generalized): Secondary | ICD-10-CM

## 2020-02-14 DIAGNOSIS — M5415 Radiculopathy, thoracolumbar region: Secondary | ICD-10-CM | POA: Insufficient documentation

## 2020-02-14 DIAGNOSIS — R2681 Unsteadiness on feet: Secondary | ICD-10-CM | POA: Insufficient documentation

## 2020-02-14 DIAGNOSIS — M545 Low back pain, unspecified: Secondary | ICD-10-CM

## 2020-02-14 DIAGNOSIS — G8929 Other chronic pain: Secondary | ICD-10-CM | POA: Insufficient documentation

## 2020-02-14 DIAGNOSIS — R262 Difficulty in walking, not elsewhere classified: Secondary | ICD-10-CM | POA: Diagnosis not present

## 2020-02-14 DIAGNOSIS — M546 Pain in thoracic spine: Secondary | ICD-10-CM | POA: Diagnosis not present

## 2020-02-14 MED ORDER — HYDROCODONE-ACETAMINOPHEN 10-325 MG PO TABS: 1.0000 | ORAL_TABLET | Freq: Four times a day (QID) | ORAL | 0 refills | Status: DC | PRN

## 2020-02-14 NOTE — Telephone Encounter (Signed)
Please advise 

## 2020-02-14 NOTE — Therapy (Signed)
[place] [address] [place], [place], [address] Phone: [phone]   Fax:  [phone]  Physical Therapy Evaluation  Patient Details  Name: Luis Brown MRN: [id number] Date of Birth: [birth year] Referring Provider (PT): Dr. Penni Homans    Encounter Date: 02/14/2020   PT End of Session - 02/14/20 1216    Visit Number 1    Number of Visits 17    Date for PT Re-Evaluation 04/10/20    Authorization Type [employer] - Visit Number 1    Authorization - Number of Visits 10    Progress Note Due on Visit 10    PT Start Time 0845    PT Stop Time 0930    PT Time Calculation (min) 45 min    Activity Tolerance Patient tolerated treatment well    Behavior During Therapy [facility] for tasks assessed/performed           Past Medical History:  Diagnosis Date  . Acute pharyngitis 10/21/2013  . Allergy    grass and pollen  . Anxiety and depression 10/25/2011  . BPH (benign prostatic hyperplasia) 04/23/2012  . Chicken pox as a child  . DDD (degenerative disc disease)    cervical responds to steroid injections and low back required surgery  . DDD (degenerative disc disease), lumbosacral   . Diabetes mellitus    pre  . Dyspnea   . ED (erectile dysfunction) 04/23/2012  . Elevated BP   . 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 Esophageal reflux 02/10/2015  . Fatigue   . HTN (hypertension)   . Hyperglycemia    preDM   . Hyperlipidemia   . Insomnia   . Low back pain radiating to both legs 01/16/2017  . Measles as a child  . Medicare annual wellness visit, subsequent 02/10/2015  . Overweight(278.02)   . Peripheral neuropathy 08/07/2019  . Personal history of colonic polyps 10/27/2012   Follows with [facility]  . Pneumonia    " walking"  . Preventative health care   . 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  . Wears glasses     Past Surgical History:  Procedure Laterality Date  . BACK SURGERY  2012 and 1994   Dr  Margret Chance, screws and cage in low back  . BIOPSY  01/09/2020   Procedure: BIOPSY;  Surgeon: Otis Brace, MD;  Location: [facility];  Service: Gastroenterology;;  . COLONOSCOPY WITH PROPOFOL N/A 01/09/2020   Procedure: COLONOSCOPY WITH PROPOFOL;  Surgeon: Otis Brace, MD;  Location: [facility];  Service: Gastroenterology;  Laterality: N/A;  . ESOPHAGOGASTRODUODENOSCOPY (EGD) WITH PROPOFOL N/A 01/09/2020   Procedure: ESOPHAGOGASTRODUODENOSCOPY (EGD) WITH PROPOFOL;  Surgeon: Otis Brace, MD;  Location: [facility];  Service: Gastroenterology;  Laterality: N/A;  . EYE SURGERY Bilateral    2016  . HARDWARE REVISION  03/12/2019   REVISION OF HARDWARE THORACIC TEN-THORACIC ELEVEN WITH APPLICATION OF ADDITIONAL RODS AND ROD SLEEVES THORACIC EIGHT-THORACIC TWELVE 12-LUMBAR ONE (N/A )  . LAMINECTOMY  02/12/2019   DECOMPRESSIVE LAMINECTOMY THORACIC NINE-THORACIC TEN AND THORACIC TEN-THORACIC ELEVEN, EXTENSION OF THORACOLUMBAR FUSION FROM THORACIC TEN TO THORACIC FIVE, LOCAL BONE GRAFT, ALLOGRAFT AND VIVIGEN (N/A)  . POLYPECTOMY  01/09/2020   Procedure: POLYPECTOMY;  Surgeon: Otis Brace, MD;  Location: [facility];  Service: Gastroenterology;;  . SPINAL FUSION N/A 02/12/2019   Procedure: DECOMPRESSIVE LAMINECTOMY THORACIC NINE-THORACIC TEN AND THORACIC TEN-THORACIC ELEVEN, EXTENSION OF THORACOLUMBAR FUSION FROM THORACIC TEN TO THORACIC FIVE, LOCAL BONE GRAFT, ALLOGRAFT AND VIVIGEN;  Surgeon: [name]  E, MD;  Location: [facility];  Service: Orthopedics;  Laterality: N/A;  DECOMPRESSIVE LAMINECTOMY THORACIC NINE-THORACIC TEN AND THORACIC TEN-THORACIC ELEVEN, EXTENSION OF TH  . SPINAL FUSION N/A 03/12/2019   Procedure: REVISION OF HARDWARE THORACIC TEN-THORACIC ELEVEN WITH APPLICATION OF ADDITIONAL RODS AND ROD SLEEVES THORACIC EIGHT-THORACIC TWELVE 12-LUMBAR ONE;  Surgeon: Jessy Oto, MD;  Location: [facility];  Service: Orthopedics;  Laterality: N/A;  . TONSILLECTOMY    . TONSILLECTOMY      as child  . torn rotator cuff  2010   right    There were no vitals filed for this visit.    Subjective Assessment - 02/14/20 0853    Subjective Patient had 5 surgeries total in lower back. Patient has multi level spinal decompression and fusion. T4 down to lumbar spine. Patient reports he lost over 40lbs after surgery last year because he developed some stomach ulcers Now he wants to gain some musles back.    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 Decreased conspicuity of ventralepidural abscess, epidural phlegmon and spinal canal narrowing atthe T4-5 level. T4-5 cord edema, unchanged. 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 Partially imaged posterior spinal fusion hardware. Please note lackof intravenous contrast and susceptibility artifact limitsevaluation. These results will be called to the ordering clinician orrepresentative by the Radiologist Assistant, and communicationdocumented in the PACS or [employer]. AP and lateral thoracic spine demonstrates obliteration of the T4-5 disc space, there is more separation of the T 5 pedicle screws within the vertebral body of T5 suggesting increased loosening of the screws at this level. IMPRESSION:1. Fracture and loosening of both T5 pedicle screws which traversethe T5 superior endplate. 2. Loosening of the right T9 pedicle screw which traverses the rightcostovertebral junction. Question subtle loosening also of the otherintervening right thoracic pedicle screws T6 through T8. 3. Sequelae of discitis osteomyelitis suspected at T9-T10 withdeveloping interbody ankylosis. Severe bilateral T9 osseous neuralforaminal stenosis. Moderate to severe bilateral T10 osseousforaminal stenosis. 4. T11 through visible upper lumbar pedicle screws are intactwithout loosening. There is developing right side posterior elementankylosis at T11-T12, and developing bilateral  posterior elementankylosis at T12-L1.    Patient Stated Goals "get stronger", some weight gain/muscle gain    Currently in Pain? Yes    Pain Score 2     Pain Location Back              [facility] PT Assessment - 02/14/20 0858      Assessment   Medical Diagnosis Weakness    Referring Provider (PT) Dr. Penni Homans     Onset Date/Surgical Date 02/05/20    Prior Therapy OP, CIR      Precautions   Precautions Other (comment)   No lifting >15 lbs     Balance Screen   Has the patient fallen in the past 6 months Yes    How many times? 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 assessed    Attention Focused    Focused Attention Appears intact    Memory Appears intact    Awareness Appears intact    Problem Solving Appears intact      ROM / Strength   AROM / PROM / Strength Strength;PROM      PROM   PROM Assessment Site Hip    Right/Left Hip Right;Left    Right Hip Flexion 110    Right Hip External Rotation  55    Right Hip Internal Rotation  0    Left Hip Flexion 100    Left Hip External Rotation  55    Left Hip Internal Rotation  -5      Strength   Strength Assessment Site Hip;Knee;Ankle    Right/Left Hip Left;Right    Right Hip Flexion 5/5    Right Hip ABduction 5/5    Left Hip Flexion 5/5    Left Hip ABduction 5/5    Right/Left Knee Right;Left    Right Knee Flexion 5/5    Right Knee Extension 5/5    Left Knee Flexion 5/5    Left Knee Extension 5/5     Right/Left Ankle Right;Left    Right Ankle Dorsiflexion 5/5    Right Ankle Plantar Flexion 2+/5    Left Ankle Dorsiflexion 5/5    Left Ankle Plantar Flexion 2+/5              HEP reviewed and performed by patient during session        Objective measurements completed on examination: See above findings.                 PT Short Term Goals - 02/14/20 1207      PT SHORT TERM GOAL #1   Title Patient will demo 10 deg of Passive internal rotation of bil hips to reduce excessive external rotation during gait    Baseline Internal rotation PROM: 0 deg R; -5 deg L    Time 4    Period Weeks    Status New    Target Date 03/13/20      PT SHORT TERM GOAL #2   Title Patient will be able to perform heel raises 1 inch off the floor for 5 consecutive reps to improve calf strength to assist with push off    Baseline <1/2 inch- pt has excessive compensation and leans forward    Time 4    Period Weeks    Status New    Target Date 03/13/20             PT Long Term Goals - 02/14/20 1214      PT LONG TERM GOAL #1   Title Patient will be independent with ongoing/advanced HEP    Baseline HEP issued 02/14/20    Time 8    Period Weeks    Status New    Target Date 04/10/20      PT LONG TERM GOAL #2   Title Patient will be able to walk for 20-30 min without rest to improve walking endurance    Baseline no formal walking program    Time 8    Period Weeks    Status New    Target Date 04/10/20                  Plan - 02/14/20 2376  Clinical Impression Statement Patient is a 74 y.o. male who was seen today for physical therapy evalution and treatment for generalized weakness and unsteadiness on feet. Patient has hx of multiple spinal surgery and multi-level thoracolumbar fusion with loosening of hardware. Patient demonstrates signficant weakness in bil ankle plantarflexors and decreased ROM with bil hip internal rotators, hip extensors, and hip flexors. Patient  will benefit from skilled PT to improve his hip ROM, improve core stabilization to improve spinal stabilization and improve strength in his legs.    Personal Factors and Comorbidities Age;Time since onset of injury/illness/exacerbation;Past/Current Experience;Comorbidity 2    Examination-Activity Limitations Carry    Examination-Participation Restrictions Cleaning;Yard Work    Merchant navy officer Evolving/Moderate complexity    Clinical Decision Making Moderate    Rehab Potential Good    PT Frequency 2x / week    PT Duration 6 weeks    PT Home Exercise Plan Access Code: [email or web address]: [email or web address]: 07/30/2021Prepared by: Gwenyth Bouillon PatelExercisesHooklying Single Knee to Chest Stretch - 1 x daily - 7 x weekly - 3 reps - 20 holdSupine Piriformis Stretch with Foot on Ground - 1 x daily - 7 x weekly - 3 reps - 20 holdSupine ITB Stretch - 1 x daily - 7 x weekly - 3 reps - 20 holdSupine Hamstring Stretch - 1 x daily - 7 x weekly - 3 reps - 20 holdStanding Soleus Stretch on Step - 1 x daily - 7 x weekly - 3 reps - 20 hold           Patient will benefit from skilled therapeutic intervention in order to improve the following deficits and impairments:  Abnormal gait, Decreased balance, Decreased endurance, Decreased range of motion, Decreased strength, Impaired sensation, Pain, Postural dysfunction  Visit Diagnosis: Pain in thoracic spine  Chronic right-sided low back pain without sciatica  Radiculopathy, thoracolumbar region  Muscle weakness (generalized)  Difficulty in walking, not elsewhere classified  Unsteadiness on feet     Problem List Patient Active Problem List   Diagnosis Date Noted  . Gastric ulcer 01/27/2020  . H/O colonoscopy with polypectomy 01/27/2020  . Protein-calorie malnutrition, severe 01/09/2020  . Weight loss 01/07/2020  . Abdominal pain 12/30/2019  . Nausea and vomiting 12/30/2019  . Weight loss, abnormal 12/30/2019  .  Change in bowel habits 12/30/2019  . 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 Medication monitoring encounter 08/26/2019  . Peripheral neuropathy 08/07/2019  . Alcohol abuse 05/09/2019  . Hyponatremia 05/05/2019  . 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[place]) 04/04/2019  . Fusion of spine of thoracolumbar region 03/12/2019  . Status post thoracic spinal fusion 02/12/2019  . 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 Thoracic spinal stenosis 04/17/2017  . Painful orthopaedic hardware ([place]) 04/17/2017  . 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 ([place]) 04/17/2017  . Low back pain radiating to both legs 01/16/2017  . Esophageal reflux 02/10/2015  . Medicare annual wellness visit, subsequent 02/10/2015  . Personal history of colonic polyps 10/27/2012  . 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  . BPH (benign prostatic hyperplasia) 04/23/2012  . ED (erectile dysfunction) 04/23/2012  . Allergic state 10/25/2011  . Anxiety and depression 10/25/2011  . Asthma   . Diabetes mellitus without complication ([place])   . Hyperlipidemia   . Overweight   . Insomnia   . Fatigue   . Preventative health care   . DDD (degenerative disc disease), lumbosacral   . Thoracic degenerative disc disease   . HTN (hypertension)  Kerrie Pleasure, PT 02/14/2020, 12:22 PM  [place] [address] [place], [place], [address] Phone: [phone]   Fax:  [phone]  Name: AZEEM POORMAN MRN: [id number] Date of Birth: September 25, 1945

## 2020-02-16 ENCOUNTER — Ambulatory Visit
Admission: RE | Admit: 2020-02-16 | Discharge: 2020-02-16 | Disposition: A | Discharge status: Home / Self Care | Payer: Medicare Other | Source: Ambulatory Visit | Attending: Gastroenterology | Admitting: Gastroenterology

## 2020-02-16 DIAGNOSIS — R1084 Generalized abdominal pain: Secondary | ICD-10-CM

## 2020-02-16 DIAGNOSIS — K838 Other specified diseases of biliary tract: Secondary | ICD-10-CM | POA: Diagnosis not present

## 2020-02-16 DIAGNOSIS — R112 Nausea with vomiting, unspecified: Secondary | ICD-10-CM | POA: Diagnosis not present

## 2020-02-16 DIAGNOSIS — R634 Abnormal weight loss: Secondary | ICD-10-CM

## 2020-02-16 DIAGNOSIS — R935 Abnormal findings on diagnostic imaging of other abdominal regions, including retroperitoneum: Secondary | ICD-10-CM | POA: Diagnosis not present

## 2020-02-16 DIAGNOSIS — Z981 Arthrodesis status: Secondary | ICD-10-CM | POA: Diagnosis not present

## 2020-02-16 MED ORDER — GADOPENTETATE DIMEGLUMINE 469.01 MG/ML IV SOLN
15.0000 mL | Freq: Once | INTRAVENOUS | Status: AC | PRN
  Administered 2020-02-16: 15 mL via INTRAVENOUS

## 2020-02-18 ENCOUNTER — Ambulatory Visit: Payer: Medicare Other | Attending: Family Medicine

## 2020-02-18 ENCOUNTER — Other Ambulatory Visit: Payer: Self-pay

## 2020-02-18 DIAGNOSIS — R2681 Unsteadiness on feet: Secondary | ICD-10-CM | POA: Diagnosis not present

## 2020-02-18 DIAGNOSIS — R262 Difficulty in walking, not elsewhere classified: Secondary | ICD-10-CM | POA: Insufficient documentation

## 2020-02-18 DIAGNOSIS — M6281 Muscle weakness (generalized): Secondary | ICD-10-CM

## 2020-02-18 DIAGNOSIS — G8929 Other chronic pain: Secondary | ICD-10-CM | POA: Diagnosis not present

## 2020-02-18 DIAGNOSIS — M5415 Radiculopathy, thoracolumbar region: Secondary | ICD-10-CM | POA: Diagnosis not present

## 2020-02-18 DIAGNOSIS — M546 Pain in thoracic spine: Secondary | ICD-10-CM | POA: Diagnosis not present

## 2020-02-18 DIAGNOSIS — M545 Low back pain, unspecified: Secondary | ICD-10-CM

## 2020-02-18 NOTE — Therapy (Addendum)
[facility] [address] [place], [place], [address] Phone: [phone]   Fax:  [phone]  Physical Therapy Treatment  Patient Details  Name: Luis Brown MRN: [id number] Date of Birth: [birth year] Referring Provider (PT): Dr. Penni Homans    Encounter Date: 02/18/2020   PT End of Session - 02/18/20 1542    Visit Number 2    Number of Visits 17    Date for PT Re-Evaluation 04/10/20    Authorization Type [employer] [place] - Visit Number 2    Authorization - Number of Visits 10    Progress Note Due on Visit 10    PT Start Time 3790    PT Stop Time 1615    PT Time Calculation (min) 40 min    Equipment Utilized During Treatment Gait belt    Activity Tolerance Patient tolerated treatment well    Behavior During Therapy WFL for tasks assessed/performed           Past Medical History:  Diagnosis Date  . Acute pharyngitis 10/21/2013  . Allergy    grass and pollen  . Anxiety and depression 10/25/2011  . BPH (benign prostatic hyperplasia) 04/23/2012  . Chicken pox as a child  . DDD (degenerative disc disease)    cervical responds to steroid injections and low back required surgery  . DDD (degenerative disc disease), lumbosacral   . Diabetes mellitus    pre  . Dyspnea   . ED (erectile dysfunction) 04/23/2012  . Elevated BP   . 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 Esophageal reflux 02/10/2015  . Fatigue   . HTN (hypertension)   . Hyperglycemia    preDM   . Hyperlipidemia   . Insomnia   . Low back pain radiating to both legs 01/16/2017  . Measles as a child  . Medicare annual wellness visit, subsequent 02/10/2015  . Overweight(278.02)   . Peripheral neuropathy 08/07/2019  . Personal history of colonic polyps 10/27/2012   Follows with [facility]  . Pneumonia    " walking"  . Preventative health care   . 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  . Wears glasses     Past Surgical History:  Procedure  Laterality Date  . BACK SURGERY  2012 and 1994   Dr Margret Chance, screws and cage in low back  . BIOPSY  01/09/2020   Procedure: BIOPSY;  Surgeon: Otis Brace, MD;  Location: [facility];  Service: Gastroenterology;;  . COLONOSCOPY WITH PROPOFOL N/A 01/09/2020   Procedure: COLONOSCOPY WITH PROPOFOL;  Surgeon: Otis Brace, MD;  Location: [facility];  Service: Gastroenterology;  Laterality: N/A;  . ESOPHAGOGASTRODUODENOSCOPY (EGD) WITH PROPOFOL N/A 01/09/2020   Procedure: ESOPHAGOGASTRODUODENOSCOPY (EGD) WITH PROPOFOL;  Surgeon: Otis Brace, MD;  Location: [facility];  Service: Gastroenterology;  Laterality: N/A;  . EYE SURGERY Bilateral    2016  . HARDWARE REVISION  03/12/2019   REVISION OF HARDWARE THORACIC TEN-THORACIC ELEVEN WITH APPLICATION OF ADDITIONAL RODS AND ROD SLEEVES THORACIC EIGHT-THORACIC TWELVE 12-LUMBAR ONE (N/A )  . LAMINECTOMY  02/12/2019   DECOMPRESSIVE LAMINECTOMY THORACIC NINE-THORACIC TEN AND THORACIC TEN-THORACIC ELEVEN, EXTENSION OF THORACOLUMBAR FUSION FROM THORACIC TEN TO THORACIC FIVE, LOCAL BONE GRAFT, ALLOGRAFT AND VIVIGEN (N/A)  . POLYPECTOMY  01/09/2020   Procedure: POLYPECTOMY;  Surgeon: Otis Brace, MD;  Location: [facility];  Service: Gastroenterology;;  . SPINAL FUSION N/A 02/12/2019   Procedure: DECOMPRESSIVE LAMINECTOMY THORACIC NINE-THORACIC TEN AND THORACIC TEN-THORACIC ELEVEN, EXTENSION OF THORACOLUMBAR FUSION FROM THORACIC TEN TO THORACIC FIVE, LOCAL  BONE GRAFT, ALLOGRAFT AND VIVIGEN;  Surgeon: Jessy Oto, MD;  Location: [facility];  Service: Orthopedics;  Laterality: N/A;  DECOMPRESSIVE LAMINECTOMY THORACIC NINE-THORACIC TEN AND THORACIC TEN-THORACIC ELEVEN, EXTENSION OF TH  . SPINAL FUSION N/A 03/12/2019   Procedure: REVISION OF HARDWARE THORACIC TEN-THORACIC ELEVEN WITH APPLICATION OF ADDITIONAL RODS AND ROD SLEEVES THORACIC EIGHT-THORACIC TWELVE 12-LUMBAR ONE;  Surgeon: Jessy Oto, MD;  Location: [facility];  Service: Orthopedics;   Laterality: N/A;  . TONSILLECTOMY    . TONSILLECTOMY     as child  . torn rotator cuff  2010   right    There were no vitals filed for this visit.   Subjective Assessment - 02/18/20 1541    Subjective Pt reports he hasn't had a chance to do lot of exercises but he has been very active finishing projects around the house.    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 Decreased conspicuity of ventralepidural abscess, epidural phlegmon and spinal canal narrowing atthe T4-5 level. T4-5 cord edema, unchanged. 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 Partially imaged posterior spinal fusion hardware. Please note lackof intravenous contrast and susceptibility artifact limitsevaluation. These results will be called to the ordering clinician orrepresentative by the Radiologist Assistant, and communicationdocumented in the PACS or [employer]. AP and lateral thoracic spine demonstrates obliteration of the T4-5 disc space, there is more separation of the T 5 pedicle screws within the vertebral body of T5 suggesting increased loosening of the screws at this level. IMPRESSION:1. Fracture and loosening of both T5 pedicle screws which traversethe T5 superior endplate. 2. Loosening of the right T9 pedicle screw which traverses the rightcostovertebral junction. Question subtle loosening also of the otherintervening right thoracic pedicle screws T6 through T8. 3. Sequelae of discitis osteomyelitis suspected at T9-T10 withdeveloping interbody ankylosis. Severe bilateral T9 osseous neuralforaminal stenosis. Moderate to severe bilateral T10 osseousforaminal stenosis. 4. T11 through visible upper lumbar pedicle screws are intactwithout loosening. There is developing right side posterior elementankylosis at T11-T12, and developing bilateral posterior elementankylosis at T12-L1.    Patient Stated Goals "get stronger", some  weight gain/muscle gain    Pain Score 2     Pain Location Back             Nu step: level 4 for 10' Single knee to chest: 10 x 10" holds R and L Supine piriformis stretch: 3 x 20" R and L Supine IT band stretch: 3 x 20" holds R and L Supine hamstring stretch: 3 x 20" R and L Standing soleus stretch: 3 x 20" R and L Seated plantar flexion: green band: 20x R and L     Current HEP Access Code: [email or web address] URL: [email or web address] Date: 02/18/2020 Prepared by: Markus Jarvis  Exercises Hooklying Single Knee to Chest Stretch - 1 x daily - 7 x weekly - 3 reps - 20 hold Supine Piriformis Stretch with Foot on Ground - 1 x daily - 7 x weekly - 3 reps - 20 hold Supine ITB Stretch - 1 x daily - 7 x weekly - 3 reps - 20 hold Supine Hamstring Stretch - 1 x daily - 7 x weekly - 3 reps - 20 hold Standing Soleus Stretch on Step - 1 x daily - 7 x weekly - 3 reps - 20 hold Seated Ankle Plantarflexion with Resistance - 1 x daily - 7 x weekly - 2 sets - 10 reps  PT Short Term Goals - 02/14/20 1207      PT SHORT TERM GOAL #1   Title Patient will demo 10 deg of Passive internal rotation of bil hips to reduce excessive external rotation during gait    Baseline Internal rotation PROM: 0 deg R; -5 deg L    Time 4    Period Weeks    Status New    Target Date 03/13/20      PT SHORT TERM GOAL #2   Title Patient will be able to perform heel raises 1 inch off the floor for 5 consecutive reps to improve calf strength to assist with push off    Baseline <1/2 inch- pt has excessive compensation and leans forward    Time 4    Period Weeks    Status New    Target Date 03/13/20             PT Long Term Goals - 02/14/20 1214      PT LONG TERM GOAL #1   Title Patient will be independent with ongoing/advanced HEP    Baseline HEP issued 02/14/20    Time 8    Period Weeks    Status New    Target Date 04/10/20      PT LONG TERM GOAL #2    Title Patient will be able to walk for 20-30 min without rest to improve walking endurance    Baseline no formal walking program    Time 8    Period Weeks    Status New    Target Date 04/10/20                 Plan - 02/18/20 1559    Clinical Impression Statement Today's session was focused on reviewing HEP from first session. Patient needed moderate cueing to correctly perform the exercises from HEP. We also started on strengthening of legs.    Personal Factors and Comorbidities Age;Time since onset of injury/illness/exacerbation;Past/Current Experience;Comorbidity 2    Examination-Activity Limitations Carry    Examination-Participation Restrictions Cleaning;Yard Work    [employer] Evolving/Moderate complexity    Rehab Potential Good    PT Frequency 2x / week    PT Duration 6 weeks    PT Home Exercise Plan Access Code: [email or web address]: [email or web address]: 07/30/2021Prepared by: Gwenyth Bouillon PatelExercisesHooklying Single Knee to Chest Stretch - 1 x daily - 7 x weekly - 3 reps - 20 holdSupine Piriformis Stretch with Foot on Ground - 1 x daily - 7 x weekly - 3 reps - 20 holdSupine ITB Stretch - 1 x daily - 7 x weekly - 3 reps - 20 holdSupine Hamstring Stretch - 1 x daily - 7 x weekly - 3 reps - 20 holdStanding Soleus Stretch on Step - 1 x daily - 7 x weekly - 3 reps - 20 hold           Patient will benefit from skilled therapeutic intervention in order to improve the following deficits and impairments:  Abnormal gait, Decreased balance, Decreased endurance, Decreased range of motion, Decreased strength, Impaired sensation, Pain, Postural dysfunction  Visit Diagnosis: Pain in thoracic spine  Chronic right-sided low back pain without sciatica  Radiculopathy, thoracolumbar region  Muscle weakness (generalized)  Difficulty in walking, not elsewhere classified  Unsteadiness on feet     Problem List Patient Active Problem List   Diagnosis  Date Noted  . Gastric ulcer 01/27/2020  . H/O colonoscopy with polypectomy 01/27/2020  . Protein-calorie malnutrition, severe 01/09/2020  . Weight  loss 01/07/2020  . Abdominal pain 12/30/2019  . Nausea and vomiting 12/30/2019  . Weight loss, abnormal 12/30/2019  . Change in bowel habits 12/30/2019  . 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 Medication monitoring encounter 08/26/2019  . Peripheral neuropathy 08/07/2019  . Alcohol abuse 05/09/2019  . Hyponatremia 05/05/2019  . 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[place]) 04/04/2019  . Fusion of spine of thoracolumbar region 03/12/2019  . Status post thoracic spinal fusion 02/12/2019  . 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 Thoracic spinal stenosis 04/17/2017  . Painful orthopaedic hardware ([place]) 04/17/2017  . 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 ([place]) 04/17/2017  . Low back pain radiating to both legs 01/16/2017  . Esophageal reflux 02/10/2015  . Medicare annual wellness visit, subsequent 02/10/2015  . Personal history of colonic polyps 10/27/2012  . 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  . BPH (benign prostatic hyperplasia) 04/23/2012  . ED (erectile dysfunction) 04/23/2012  . Allergic state 10/25/2011  . Anxiety and depression 10/25/2011  . Asthma   . Diabetes mellitus without complication ([place])   . Hyperlipidemia   . Overweight   . Insomnia   . Fatigue   . Preventative health care   . DDD (degenerative disc disease), lumbosacral   . Thoracic degenerative disc disease   . HTN (hypertension)     Kerrie Pleasure, PT 02/18/2020, 4:01 PM  [facility] [address] [place], [place], [address] Phone: [phone]   Fax:  [phone]  Name: Luis Brown MRN: [id number] Date of Birth: 1945-11-05

## 2020-02-19 ENCOUNTER — Encounter: Payer: Self-pay | Admitting: Medical

## 2020-02-20 ENCOUNTER — Telehealth: Payer: Self-pay | Admitting: Medical

## 2020-02-20 ENCOUNTER — Ambulatory Visit: Payer: Medicare Other | Admitting: Gastroenterology

## 2020-02-20 DIAGNOSIS — E1169 Type 2 diabetes mellitus with other specified complication: Secondary | ICD-10-CM

## 2020-02-20 MED ORDER — ATORVASTATIN CALCIUM 10 MG PO TABS: 10.0000 mg | ORAL_TABLET | Freq: Every day | ORAL | 3 refills | Status: DC

## 2020-02-20 NOTE — Telephone Encounter (Signed)
Pt notified and lab appointment scheduled

## 2020-02-20 NOTE — Telephone Encounter (Signed)
Rx atorvastatin sent to pt pharmacy. Future labs placed.

## 2020-02-20 NOTE — Telephone Encounter (Signed)
Will you send in medication , it has not been filled since 2020 not sure if this is the correct dose or correct directions

## 2020-02-22 ENCOUNTER — Other Ambulatory Visit: Payer: Self-pay | Admitting: Specialist

## 2020-02-23 NOTE — Telephone Encounter (Signed)
JN 

## 2020-02-24 MED ORDER — HYDROCODONE-ACETAMINOPHEN 10-325 MG PO TABS: 1.0000 | ORAL_TABLET | Freq: Four times a day (QID) | ORAL | 0 refills | Status: DC | PRN

## 2020-02-25 ENCOUNTER — Encounter: Payer: Self-pay | Admitting: Medical

## 2020-02-26 ENCOUNTER — Ambulatory Visit: Payer: Medicare Other

## 2020-02-26 ENCOUNTER — Encounter: Payer: Self-pay | Admitting: Infectious Disease

## 2020-02-26 ENCOUNTER — Ambulatory Visit: Payer: Medicare Other | Admitting: Infectious Disease

## 2020-02-26 ENCOUNTER — Other Ambulatory Visit: Payer: Self-pay

## 2020-02-26 VITALS — BP 126/76 | HR 63 | Temp 97.5°F | Wt 165.0 lb

## 2020-02-26 DIAGNOSIS — T84498A Other mechanical complication of other internal orthopedic devices, implants and grafts, initial encounter: Secondary | ICD-10-CM

## 2020-02-26 DIAGNOSIS — R5383 Other fatigue: Secondary | ICD-10-CM

## 2020-02-26 DIAGNOSIS — M5134 Other intervertebral disc degeneration, thoracic region: Secondary | ICD-10-CM

## 2020-02-26 DIAGNOSIS — K259 Gastric ulcer, unspecified as acute or chronic, without hemorrhage or perforation: Secondary | ICD-10-CM | POA: Diagnosis not present

## 2020-02-26 DIAGNOSIS — M4325 Fusion of spine, thoracolumbar region: Secondary | ICD-10-CM

## 2020-02-26 DIAGNOSIS — M4624 Osteomyelitis of vertebra, thoracic region: Secondary | ICD-10-CM

## 2020-02-26 DIAGNOSIS — G062 Extradural and subdural abscess, unspecified: Secondary | ICD-10-CM | POA: Diagnosis not present

## 2020-02-26 DIAGNOSIS — R634 Abnormal weight loss: Secondary | ICD-10-CM

## 2020-02-26 MED ORDER — DOXYCYCLINE HYCLATE 100 MG PO TABS: 100.0000 mg | ORAL_TABLET | Freq: Two times a day (BID) | ORAL | 5 refills | Status: DC

## 2020-02-26 MED ORDER — METFORMIN HCL 1000 MG PO TABS: 1000.0000 mg | ORAL_TABLET | Freq: Two times a day (BID) | ORAL | 0 refills | Status: DC

## 2020-02-26 MED ORDER — FERRETTS 325 (106 FE) MG PO TABS: ORAL_TABLET | ORAL | 2 refills | Status: DC

## 2020-02-26 NOTE — Progress Notes (Signed)
Subjective:  Chief complaint: Low back pain t [name], male    DOB: 1945/10/31, 74 y.o.   MRN: [id number]  HPI  45 -year-old man with a history of degenerative disc disease and multiple spinal fusions who was initially seen by Dr. Prince Rome in our group.  He has a rather complex surgical history which in regards to his back began in 2015 Dr. Agapito Games performed an T8-L1 fusion to assist with left leg radicular pain and numbness.  Despite multiple visits to see his surgeon at [facility] postoperatively, the patient states that his left leg numbness and direct low thoracic back pain returned within months after his surgery.  Imaging in 2018 showed that he had an L5-S1 rod breakage and loosening of the pedicle screws at that level.  He sought a second opinion with Dr. Louanne Skye late in 2018.  In November 2018, he had a replacement of the L5-S1 fusion segment and bone grafting was performed simultaneously.  Per the operative note reviewed, the broken rod was left intact at that time.  He reports several partial or near falls following this procedure, but he denies any total falls or direct trauma to his back in the past several years.  As his back pain and left leg radicular symptoms gradually returned, further imaging was performed that suggested screw loosening at the T9-T11 section of his spinal fusion.  On February 12, 2019 he returned to the OR with Dr. Louanne Skye and was found to have screw loosening at the same levels, so his fusion was extended superiorly to T5 and bone grafting was performed.  Initially, the patient had a good postoperative course, but over weeks 2 and 3 postoperatively, he began developing pain in his back at his operative site, prompting plain films to be obtained.  X-rays showed a loss of fixation of the opposite left internal rod fastener at the site of his severe back pain and the development of acute loosening of the interval rod fasteners and loss of fixation.  On March 12, 2019, he was taken back to the operating room where it was noted that his Harrington rod had loosened and nearly entirely migrated out of position.  A culture was obtained near the pedicle screw at T10 as a small amount of soft tissue with significant bone osteomalacia was observed.  Given concern for his spinal stability, his recently placed Harrington rods were removed and revised/replaced.  Operative cultures were followed and remained negative at the time of his hospital discharge on March 14, 2019.  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    He was then placed on ceftriaxone in October with improvement in his symptoms and his inflammatory markers.  He completed his the ceftriaxone and changed over to doxycycline.  He saw Dr. Linus Salmons in February and seemed to be doing relatively well.  Since then he did have some episodes of feeling poorly and feeling flushed and feeling like "something not right I think I am sick".   Dr. 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 Extensive ventral and dorsal epidural thickening and enhancement around the cord from T4 there approximately T6 compatible with epidural infection. And probable osteomyelitis of T6.  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  Dr. Louanne Skye called me arrange for the  patient be seen in clinic today.  On exam he appears stable and does not have any new neurological deficits.  Dr. Louanne Skye was going to be in touch with radiology to see if they might build a sample a disc for culture.  We obtained blood cultures [facility] and these were with no out any growth.  Interventional radiology could not aspirate the disc base for culture and we had been for initiated him on ceftriaxone on April 6.  He was  tolerating the antibiotics foot relatively well though he complains of some dark stools that he thinks might be due to the antibiotics.  He has continued ceftriaxone and then changed over to doxycycline.  MRI of the spine is been performed on Dec 06, 2019 and I have reviewed and agree with the findings when they are official report is showing  T4-5 discitis osteomyelitis. Decreased conspicuity of ventral epidural abscess, epidural phlegmon and spinal canal narrowing at the T4-5 level.  T4-5 cord edema, unchanged.  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  Partially imaged posterior spinal fusion hardware. Please note lack of intravenous contrast and susceptibility artifact limits Evaluation.  When I last saw him he was feeling profoundly fatigued and apparently had lost weight over the last several months.  He has some dizziness which seems to have preceded him being on doxycycline   We switched him from doxycycline to amoxicillin during that visit  Since then he was actually found to have significant gastric ulcers.  He is been taken off nonsteroidals and has not been drinking alcohol for the last 4 months.  On a PPI and Carafate  He has been putting back on weight and feeling much better his energy is improving appetite improving.  He still has some low back pain but it is much improved compared to when the last time I saw him.   Is also back on doxycycline in place of amoxicillin.  Past Medical History:  Diagnosis Date  . Acute pharyngitis 10/21/2013  . Allergy    grass and pollen  . Anxiety and depression 10/25/2011  . BPH (benign prostatic hyperplasia) 04/23/2012  . Chicken pox as a child  . DDD (degenerative disc disease)    cervical responds to steroid injections and low back required surgery  . DDD (degenerative disc disease), lumbosacral   . Diabetes mellitus    pre  . Dyspnea   . ED (erectile dysfunction) 04/23/2012  . Elevated BP   . 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 Esophageal reflux 02/10/2015  . Fatigue   . HTN (hypertension)   . Hyperglycemia    preDM   . Hyperlipidemia   . Insomnia   . Low back pain radiating to both legs 01/16/2017  . Measles as a child  . Medicare annual wellness visit, subsequent 02/10/2015  . Overweight(278.02)   . Peripheral neuropathy 08/07/2019  . Personal history of colonic polyps 10/27/2012   Follows with [facility]  . Pneumonia    " walking"  . Preventative health care   . 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  . Wears glasses     Past Surgical History:  Procedure Laterality Date  . BACK SURGERY  2012 and 1994   Dr Margret Chance, screws and cage in low back  . BIOPSY  01/09/2020   Procedure: BIOPSY;  Surgeon: Otis Brace, MD;  Location: [facility];  Service: Gastroenterology;;  . COLONOSCOPY WITH PROPOFOL N/A 01/09/2020   Procedure: COLONOSCOPY WITH PROPOFOL;  Surgeon: Otis Brace, MD;  Location: [facility];  Service: Gastroenterology;  Laterality: N/A;  . ESOPHAGOGASTRODUODENOSCOPY (EGD) WITH PROPOFOL N/A 01/09/2020   Procedure: ESOPHAGOGASTRODUODENOSCOPY (EGD) WITH PROPOFOL;  Surgeon: Otis Brace, MD;  Location: [facility];  Service: Gastroenterology;  Laterality: N/A;  . EYE SURGERY Bilateral    2016  . HARDWARE REVISION  03/12/2019   REVISION OF HARDWARE THORACIC TEN-THORACIC ELEVEN WITH APPLICATION OF ADDITIONAL RODS AND ROD SLEEVES THORACIC EIGHT-THORACIC TWELVE 12-LUMBAR ONE (N/A )  . LAMINECTOMY  02/12/2019   DECOMPRESSIVE LAMINECTOMY THORACIC NINE-THORACIC TEN AND THORACIC TEN-THORACIC ELEVEN, EXTENSION OF THORACOLUMBAR FUSION FROM THORACIC TEN TO THORACIC FIVE, LOCAL BONE GRAFT, ALLOGRAFT AND VIVIGEN (N/A)  . POLYPECTOMY  01/09/2020   Procedure: POLYPECTOMY;  Surgeon: Otis Brace, MD;  Location: [facility];  Service: Gastroenterology;;  . SPINAL FUSION N/A 02/12/2019   Procedure: DECOMPRESSIVE LAMINECTOMY THORACIC NINE-THORACIC TEN AND THORACIC TEN-THORACIC  ELEVEN, EXTENSION OF THORACOLUMBAR FUSION FROM THORACIC TEN TO THORACIC FIVE, LOCAL BONE GRAFT, ALLOGRAFT AND VIVIGEN;  Surgeon: Jessy Oto, MD;  Location: [place];  Service: Orthopedics;  Laterality: N/A;  DECOMPRESSIVE LAMINECTOMY THORACIC NINE-THORACIC TEN AND THORACIC TEN-THORACIC ELEVEN, EXTENSION OF TH  . SPINAL FUSION N/A 03/12/2019   Procedure: REVISION OF HARDWARE THORACIC TEN-THORACIC ELEVEN WITH APPLICATION OF ADDITIONAL RODS AND ROD SLEEVES THORACIC EIGHT-THORACIC TWELVE 12-LUMBAR ONE;  Surgeon: Jessy Oto, MD;  Location: [place];  Service: Orthopedics;  Laterality: N/A;  . TONSILLECTOMY    . TONSILLECTOMY     as child  . torn rotator cuff  2010   right    Family History  Problem Relation Age of Onset  . Hypertension Mother   . Diabetes Mother        type 2  . Cancer Mother 46       breast in remission  . Emphysema Father        smoker  . COPD Father        smoker  . Stroke Father 42       mini  . Heart disease Father   . Hypertension Sister   . Hyperlipidemia Sister   . Scoliosis Sister   . Osteoporosis Sister   . Hypertension Maternal Grandmother   . Scoliosis Maternal Grandmother   . Heart disease Maternal Grandfather   . Heart disease Paternal Grandfather        smoker  . Heart disease Daughter       Social History   Socioeconomic History  . Marital status: Married    Spouse name: Not on file  . Number of children: Not on file  . Years of education: Not on file  . Highest education level: Not on file  Occupational History  . Not on file  Tobacco Use  . Smoking status: Former Smoker    Packs/day: 1.00    Years: 20.00    Pack years: 20.00    Types: Cigarettes    Quit date: 07/18/1989    Years since quitting: 30.6  . Smokeless tobacco: Never Used  . Tobacco comment: off and on smoking for the 20 yrs  Vaping Use  . Vaping Use: Never used  Substance and Sexual Activity  . Alcohol use: Yes    Comment: drink or two a day- mixed drink  . Drug  use: No  . Sexual activity: Yes    Comment: lives with wife, still working, no dietary restrictions, continues to exercise intermittently  Other Topics Concern  . Not on file  Social History Narrative  . Not on file   Social Determinants of  Health   Financial Resource Strain: Low Risk   . Difficulty of Paying Living Expenses: Not hard at all  Food Insecurity:   . Worried About Charity fundraiser in the Last Year:   . Arboriculturist in the Last Year:   Transportation Needs: No Transportation Needs  . Lack of Transportation (Medical): No  . Lack of Transportation (Non-Medical): No  Physical Activity:   . Days of Exercise per Week:   . Minutes of Exercise per Session:   Stress:   . Feeling of Stress :   Social Connections:   . Frequency of Communication with Friends and Family:   . Frequency of Social Gatherings with Friends and Family:   . Attends Religious Services:   . Active Member of Clubs or Organizations:   . Attends Archivist Meetings:   Marland Kitchen Marital Status:     No Known Allergies   Current Outpatient Medications:  .  atorvastatin (LIPITOR) 10 MG tablet, Take 1 tablet (10 mg total) by mouth daily., Disp: 30 tablet, Rfl: 3 .  busPIRone (BUSPAR) 7.5 MG tablet, 1-2 TAB BY MOUTH 3 TIMES A DAY FOR ANXIETY (Patient taking differently: Take 7.5 mg by mouth 3 (three) times daily. ), Disp: 540 tablet, Rfl: 1 .  cetirizine (ZYRTEC) 10 MG tablet, Take 10 mg by mouth daily. In the morning, Disp: , Rfl:  .  diazepam (VALIUM) 10 MG tablet, Take 0.5 tablets (5 mg total) by mouth at bedtime., Disp: , Rfl:  .  ferrous fumarate (FERRETTS) 325 (106 Fe) MG TABS tablet, TAKE 1 TABLET (106 MG OF IRON TOTAL) BY MOUTH DAILY., Disp: 30 tablet, Rfl: 2 .  glucose blood (CONTOUR NEXT TEST) test strip, Check blood sugar once daily, Disp: 100 each, Rfl: 12 .  HYDROcodone-acetaminophen (NORCO) 10-325 MG tablet, Take 1 tablet by mouth every 6 (six) hours as needed for severe pain., Disp: 30  tablet, Rfl: 0 .  metFORMIN (GLUCOPHAGE) 1000 MG tablet, Take 1 tablet (1,000 mg total) by mouth 2 (two) times daily with a meal., Disp: 180 tablet, Rfl: 0 .  Multiple Vitamin (MULTIVITAMIN WITH MINERALS) TABS tablet, Take 1 tablet by mouth daily., Disp: , Rfl:  .  ondansetron (ZOFRAN) 4 MG tablet, Take 1 tablet (4 mg total) by mouth every 8 (eight) hours as needed for nausea or vomiting., Disp: 20 tablet, Rfl: 0 .  pantoprazole (PROTONIX) 40 MG tablet, Take 1 tablet (40 mg total) by mouth 2 (two) times daily before a meal., Disp: 60 tablet, Rfl: 1 .  sitaGLIPtin (JANUVIA) 50 MG tablet, Take 1 tablet (50 mg total) by mouth daily., Disp: 30 tablet, Rfl: 3 .  sucralfate (CARAFATE) 1 g tablet, Take 1 tablet (1 g total) by mouth 4 (four) times daily -  with meals and at bedtime., Disp: 120 tablet, Rfl: 2 .  tamsulosin (FLOMAX) 0.4 MG CAPS capsule, TAKE 2 CAPSULES BY MOUTH EVERY DAY, Disp: 180 capsule, Rfl: 0 .  testosterone cypionate (DEPOTESTOSTERONE CYPIONATE) 200 MG/ML injection, Inject 1 mL (200 mg total) into the muscle every 14 (fourteen) days., Disp: 2 mL, Rfl: 5 .  tiZANidine (ZANAFLEX) 4 MG tablet, TAKE 1.5 TABLETS (6 MG TOTAL) BY MOUTH EVERY 6 (SIX) HOURS AS NEEDED FOR MUSCLE SPASMS., Disp: 90 tablet, Rfl: 1 .  venlafaxine XR (EFFEXOR-XR) 150 MG 24 hr capsule, Take 1 capsule (150 mg total) by mouth daily with breakfast., Disp: 90 capsule, Rfl: 1 .  lisinopril (ZESTRIL) 5 MG tablet, Take 0.5 tablets (  2.5 mg total) by mouth daily. (Patient not taking: Reported on 02/14/2020), Disp: , Rfl:  .  pregabalin (LYRICA) 75 MG capsule, Take 1 capsule (75 mg total) by mouth 2 (two) times daily. (Patient not taking: Reported on 02/26/2020), Disp: 60 capsule, Rfl: 3   Review of Systems  Constitutional: Negative for activity change, appetite change, chills, fever and unexpected weight change.  HENT: Negative for congestion, rhinorrhea, sinus pressure, sneezing, sore throat and trouble swallowing.   Eyes:  Negative for photophobia and visual disturbance.  Respiratory: Negative for cough, chest tightness, shortness of breath, wheezing and stridor.   Cardiovascular: Negative for chest pain, palpitations and leg swelling.  Gastrointestinal: Negative for abdominal distention, abdominal pain, anal bleeding, blood in stool, constipation, diarrhea, nausea and vomiting.  Genitourinary: Negative for difficulty urinating, dysuria, flank pain and hematuria.  Musculoskeletal: Positive for arthralgias and back pain. Negative for gait problem, joint swelling and myalgias.  Skin: Negative for color change, pallor, rash and wound.  Neurological: Negative for dizziness, tremors, weakness and light-headedness.  Hematological: Negative for adenopathy. Does not bruise/bleed easily.  Psychiatric/Behavioral: Negative for agitation, behavioral problems, confusion, decreased concentration, dysphoric mood and sleep disturbance.       Objective:   Physical Exam Constitutional:      General: He is not in acute distress.    Appearance: Normal appearance. He is well-developed. He is not ill-appearing or diaphoretic.  HENT:     Head: Normocephalic and atraumatic.     Right Ear: Hearing and external ear normal.     Left Ear: Hearing and external ear normal.     Nose: No nasal deformity or rhinorrhea.  Eyes:     General: No scleral icterus.    Extraocular Movements: Extraocular movements intact.     Conjunctiva/sclera: Conjunctivae normal.     Right eye: Right conjunctiva is not injected.     Left eye: Left conjunctiva is not injected.  Neck:     Vascular: No JVD.  Cardiovascular:     Rate and Rhythm: Normal rate and regular rhythm.     Heart sounds: S1 normal and S2 normal. No friction rub.  Pulmonary:     Effort: Pulmonary effort is normal. No respiratory distress.  Abdominal:     General: There is no distension.     Palpations: Abdomen is soft.  Musculoskeletal:        General: Normal range of motion.      Right shoulder: Normal.     Left shoulder: Normal.     Cervical back: Normal range of motion and neck supple.     Right hip: Normal.     Left hip: Normal.     Right knee: Normal.     Left knee: Normal.  Lymphadenopathy:     Head:     Right side of head: No submandibular, preauricular or posterior auricular adenopathy.     Left side of head: No submandibular, preauricular or posterior auricular adenopathy.     Cervical: No cervical adenopathy.     Right cervical: No superficial or deep cervical adenopathy.    Left cervical: No superficial or deep cervical adenopathy.  Skin:    General: Skin is warm and dry.     Coloration: Skin is not pale.     Findings: No abrasion, bruising, ecchymosis, erythema, lesion or rash.     Nails: There is no clubbing.  Neurological:     General: No focal deficit present.     Mental Status: He is alert and  oriented to person, place, and time.     Sensory: No sensory deficit.     Coordination: Coordination normal.     Gait: Gait normal.  Psychiatric:        Attention and Perception: Attention and perception normal. He is attentive. He does not perceive auditory hallucinations.        Mood and Affect: Mood and affect normal. Mood is not depressed.        Speech: Speech normal.        Behavior: Behavior normal. Behavior is cooperative.        Thought Content: Thought content normal.        Judgment: Judgment normal.           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  We will recheck inflammatory markers.  He is going to see Dr. Louanne Skye in roughly a month.  I would consider getting an MRI to see if his epidural abscesses have resolved.  Can get that MRI in the next month or can wait little bit longer I have to schedule him to see me in December in the interim  Gastric ulcers: I am glad that the cause of his symptoms was found and now  being treated.  Dizziness: Resolved  Weight loss he is putting back on weight now that he is been able to gain control of the stomach ulcers

## 2020-02-27 ENCOUNTER — Encounter: Payer: Self-pay | Admitting: Specialist

## 2020-02-27 LAB — CBC WITH DIFFERENTIAL/PLATELET
Absolute Monocytes: 895 cells/uL (ref 200–950)
Basophils Absolute: 28 cells/uL (ref 0–200)
Basophils Relative: 0.4 %
Eosinophils Absolute: 99 cells/uL (ref 15–500)
Eosinophils Relative: 1.4 %
HCT: 43.1 % (ref 38.5–50.0)
Hemoglobin: 14.5 g/dL (ref 13.2–17.1)
Lymphs Abs: 646 cells/uL — ABNORMAL LOW (ref 850–3900)
MCH: 31.1 pg (ref 27.0–33.0)
MCHC: 33.6 g/dL (ref 32.0–36.0)
MCV: 92.5 fL (ref 80.0–100.0)
MPV: 9 fL (ref 7.5–12.5)
Monocytes Relative: 12.6 %
Neutro Abs: 5432 cells/uL (ref 1500–7800)
Neutrophils Relative %: 76.5 %
Platelets: 307 10*3/uL (ref 140–400)
RBC: 4.66 10*6/uL (ref 4.20–5.80)
RDW: 14.2 % (ref 11.0–15.0)
Total Lymphocyte: 9.1 %
WBC: 7.1 10*3/uL (ref 3.8–10.8)

## 2020-02-27 LAB — BASIC METABOLIC PANEL WITH GFR
BUN: 14 mg/dL (ref 7–25)
CO2: 32 mmol/L (ref 20–32)
Calcium: 9.8 mg/dL (ref 8.6–10.3)
Chloride: 93 mmol/L — ABNORMAL LOW (ref 98–110)
Creat: 0.7 mg/dL (ref 0.70–1.18)
GFR, Est African American: 106 mL/min/{1.73_m2} (ref >=60)
GFR, Est Non African American: 92 mL/min/{1.73_m2} (ref >=60)
Glucose, Bld: 108 mg/dL — ABNORMAL HIGH (ref 65–99)
Potassium: 5 mmol/L (ref 3.5–5.3)
Sodium: 131 mmol/L — ABNORMAL LOW (ref 135–146)

## 2020-02-27 LAB — C-REACTIVE PROTEIN: CRP: 4.1 mg/L (ref <8.0)

## 2020-02-27 LAB — SEDIMENTATION RATE: Sed Rate: 2 mm/h (ref 0–20)

## 2020-02-28 ENCOUNTER — Other Ambulatory Visit (INDEPENDENT_AMBULATORY_CARE_PROVIDER_SITE_OTHER): Payer: Medicare Other

## 2020-02-28 ENCOUNTER — Other Ambulatory Visit: Payer: Self-pay

## 2020-02-28 ENCOUNTER — Ambulatory Visit (INDEPENDENT_AMBULATORY_CARE_PROVIDER_SITE_OTHER): Payer: Medicare Other

## 2020-02-28 ENCOUNTER — Ambulatory Visit: Payer: Medicare Other | Admitting: Physical Therapy

## 2020-02-28 DIAGNOSIS — M546 Pain in thoracic spine: Secondary | ICD-10-CM | POA: Diagnosis not present

## 2020-02-28 DIAGNOSIS — E785 Hyperlipidemia, unspecified: Secondary | ICD-10-CM | POA: Diagnosis not present

## 2020-02-28 DIAGNOSIS — E349 Endocrine disorder, unspecified: Secondary | ICD-10-CM

## 2020-02-28 DIAGNOSIS — M5415 Radiculopathy, thoracolumbar region: Secondary | ICD-10-CM | POA: Diagnosis not present

## 2020-02-28 DIAGNOSIS — R262 Difficulty in walking, not elsewhere classified: Secondary | ICD-10-CM | POA: Diagnosis not present

## 2020-02-28 DIAGNOSIS — M6281 Muscle weakness (generalized): Secondary | ICD-10-CM | POA: Diagnosis not present

## 2020-02-28 DIAGNOSIS — M545 Low back pain: Secondary | ICD-10-CM | POA: Diagnosis not present

## 2020-02-28 DIAGNOSIS — E1169 Type 2 diabetes mellitus with other specified complication: Secondary | ICD-10-CM

## 2020-02-28 DIAGNOSIS — G8929 Other chronic pain: Secondary | ICD-10-CM | POA: Diagnosis not present

## 2020-02-28 DIAGNOSIS — R2681 Unsteadiness on feet: Secondary | ICD-10-CM | POA: Diagnosis not present

## 2020-02-28 LAB — COMPREHENSIVE METABOLIC PANEL
ALT: 10 U/L (ref 0–53)
AST: 21 U/L (ref 0–37)
Albumin: 4.4 g/dL (ref 3.5–5.2)
Alkaline Phosphatase: 73 U/L (ref 39–117)
BUN: 19 mg/dL (ref 6–23)
CO2: 32 mEq/L (ref 19–32)
Calcium: 9.8 mg/dL (ref 8.4–10.5)
Chloride: 95 mEq/L — ABNORMAL LOW (ref 96–112)
Creatinine, Ser: 0.73 mg/dL (ref 0.40–1.50)
GFR: 104.32 mL/min (ref >60.00)
Glucose, Bld: 104 mg/dL — ABNORMAL HIGH (ref 70–99)
Potassium: 4.2 mEq/L (ref 3.5–5.1)
Sodium: 135 mEq/L (ref 135–145)
Total Bilirubin: 0.7 mg/dL (ref 0.2–1.2)
Total Protein: 6.7 g/dL (ref 6.0–8.3)

## 2020-02-28 LAB — LIPID PANEL
Cholesterol: 130 mg/dL (ref 0–200)
HDL: 62.4 mg/dL (ref >39.00)
LDL Cholesterol: 49 mg/dL (ref 0–99)
NonHDL: 67.67
Total CHOL/HDL Ratio: 2
Triglycerides: 94 mg/dL (ref 0.0–149.0)
VLDL: 18.8 mg/dL (ref 0.0–40.0)

## 2020-02-28 MED ORDER — TESTOSTERONE CYPIONATE 200 MG/ML IM SOLN
200.0000 mg | Freq: Once | INTRAMUSCULAR | Status: AC
  Administered 2020-02-28: 200 mg via INTRAMUSCULAR

## 2020-02-28 NOTE — Therapy (Signed)
[facility] [address] [place], [place], [address] Phone: [phone]   Fax:  [phone]  Physical Therapy Treatment  Patient Details  Name: Luis Brown MRN: [id number] Date of Birth: Nov 06, 1945 Referring Provider (PT): Dr. Penni Homans    Encounter Date: 02/28/2020   PT End of Session - 02/28/20 1548    Visit Number 3    Number of Visits 17    Date for PT Re-Evaluation 04/10/20    Authorization Type UHC [place] - Visit Number 3    Authorization - Number of Visits 10    Progress Note Due on Visit 10    PT Start Time 1402    PT Stop Time 1444    PT Time Calculation (min) 42 min    Equipment Utilized During Treatment Gait belt    Activity Tolerance Patient tolerated treatment well    Behavior During Therapy WFL for tasks assessed/performed           Past Medical History:  Diagnosis Date  . Acute pharyngitis 10/21/2013  . Allergy    grass and pollen  . Anxiety and depression 10/25/2011  . BPH (benign prostatic hyperplasia) 04/23/2012  . Chicken pox as a child  . DDD (degenerative disc disease)    cervical responds to steroid injections and low back required surgery  . DDD (degenerative disc disease), lumbosacral   . Diabetes mellitus    pre  . Dyspnea   . ED (erectile dysfunction) 04/23/2012  . Elevated BP   . 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 Esophageal reflux 02/10/2015  . Fatigue   . HTN (hypertension)   . Hyperglycemia    preDM   . Hyperlipidemia   . Insomnia   . Low back pain radiating to both legs 01/16/2017  . Measles as a child  . Medicare annual wellness visit, subsequent 02/10/2015  . Overweight(278.02)   . Peripheral neuropathy 08/07/2019  . Personal history of colonic polyps 10/27/2012   Follows with [facility]  . Pneumonia    " walking"  . Preventative health care   . 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  . Wears glasses     Past Surgical History:  Procedure  Laterality Date  . BACK SURGERY  2012 and 1994   Dr Margret Chance, screws and cage in low back  . BIOPSY  01/09/2020   Procedure: BIOPSY;  Surgeon: Otis Brace, MD;  Location: [facility];  Service: Gastroenterology;;  . COLONOSCOPY WITH PROPOFOL N/A 01/09/2020   Procedure: COLONOSCOPY WITH PROPOFOL;  Surgeon: Otis Brace, MD;  [facility]: [facility];  Service: Gastroenterology;  Laterality: N/A;  . ESOPHAGOGASTRODUODENOSCOPY (EGD) WITH PROPOFOL N/A 01/09/2020   Procedure: ESOPHAGOGASTRODUODENOSCOPY (EGD) WITH PROPOFOL;  Surgeon: Otis Brace, MD;  [facility];  Service: Gastroenterology;  Laterality: N/A;  . EYE SURGERY Bilateral    2016  . HARDWARE REVISION  03/12/2019   REVISION OF HARDWARE THORACIC TEN-THORACIC ELEVEN WITH APPLICATION OF ADDITIONAL RODS AND ROD SLEEVES THORACIC EIGHT-THORACIC TWELVE 12-LUMBAR ONE (N/A )  . LAMINECTOMY  02/12/2019   DECOMPRESSIVE LAMINECTOMY THORACIC NINE-THORACIC TEN AND THORACIC TEN-THORACIC ELEVEN, EXTENSION OF THORACOLUMBAR FUSION FROM THORACIC TEN TO THORACIC FIVE, LOCAL BONE GRAFT, ALLOGRAFT AND VIVIGEN (N/A)  . POLYPECTOMY  01/09/2020   Procedure: POLYPECTOMY;  Surgeon: Otis Brace, MD;  [facility];  Service: Gastroenterology;;  . SPINAL FUSION N/A 02/12/2019   Procedure: DECOMPRESSIVE LAMINECTOMY THORACIC NINE-THORACIC TEN AND THORACIC TEN-THORACIC ELEVEN, EXTENSION OF THORACOLUMBAR FUSION FROM THORACIC TEN TO THORACIC FIVE, LOCAL  BONE GRAFT, ALLOGRAFT AND VIVIGEN;  Surgeon: Jessy Oto, MD;  Location: [facility];  Service: Orthopedics;  Laterality: N/A;  DECOMPRESSIVE LAMINECTOMY THORACIC NINE-THORACIC TEN AND THORACIC TEN-THORACIC ELEVEN, EXTENSION OF TH  . SPINAL FUSION N/A 03/12/2019   Procedure: REVISION OF HARDWARE THORACIC TEN-THORACIC ELEVEN WITH APPLICATION OF ADDITIONAL RODS AND ROD SLEEVES THORACIC EIGHT-THORACIC TWELVE 12-LUMBAR ONE;  Surgeon: Jessy Oto, MD;  Location: [facility];  Service: Orthopedics;   Laterality: N/A;  . TONSILLECTOMY    . TONSILLECTOMY     as child  . torn rotator cuff  2010   right    There were no vitals filed for this visit.   Subjective Assessment - 02/28/20 1404    Subjective Hasn't had the chance to do the exercises, but has been really busy doing exercise type of work.    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 Decreased conspicuity of ventralepidural abscess, epidural phlegmon and spinal canal narrowing atthe T4-5 level. T4-5 cord edema, unchanged. 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 Partially imaged posterior spinal fusion hardware. Please note lackof intravenous contrast and susceptibility artifact limitsevaluation. These results will be called to the ordering clinician orrepresentative by the Radiologist Assistant, and communicationdocumented in the PACS or [employer]. AP and lateral thoracic spine demonstrates obliteration of the T4-5 disc space, there is more separation of the T 5 pedicle screws within the vertebral body of T5 suggesting increased loosening of the screws at this level. IMPRESSION:1. Fracture and loosening of both T5 pedicle screws which traversethe T5 superior endplate. 2. Loosening of the right T9 pedicle screw which traverses the rightcostovertebral junction. Question subtle loosening also of the otherintervening right thoracic pedicle screws T6 through T8. 3. Sequelae of discitis osteomyelitis suspected at T9-T10 withdeveloping interbody ankylosis. Severe bilateral T9 osseous neuralforaminal stenosis. Moderate to severe bilateral T10 osseousforaminal stenosis. 4. T11 through visible upper lumbar pedicle screws are intactwithout loosening. There is developing right side posterior elementankylosis at T11-T12, and developing bilateral posterior elementankylosis at T12-L1.    Patient Stated Goals "get stronger", some weight gain/muscle gain     Currently in Pain? Yes    Pain Score 2     Pain Location Back    Pain Orientation Upper;Mid    Pain Descriptors / Indicators Sore    Pain Type Chronic pain;Surgical pain    Aggravating Factors  at night when trying to go to sleep can be more uncomfortable.    Pain Relieving Factors getting in the right position                             [facility] Adult PT Treatment/Exercise - 02/28/20 1411      Exercises   Exercises Other Exercises;Knee/Hip    Other Exercises  With BUE support at counter: 2 x 10 reps heel <> toe raises      Knee/Hip Exercises: Aerobic   Nustep With BLE and BUE for strengthening, activity tolerance at gear 4 for 7 minutes       Knee/Hip Exercises: Standing   Hip Flexion Stengthening;3 sets;Limitations    Hip Flexion Limitations dynamic marching with green theraband resistance on distal thighs, UE support, cues for slowed and technique - down and back at countertop x3 reps     Other Standing Knee Exercises with green theraband around distal thighs, down and back at countertop x3 reps, cues to hold mini squat  position               Balance Exercises - 02/28/20 0001      Balance Exercises: Standing   Rockerboard Anterior/posterior;EO;Limitations    Rockerboard Limitations x20 reps in corner working on hip/ankle strategy, cues for weight shifting forward onto toes - needing intermittent UE support    Other Standing Exercises at countertop with single UE support: staggered stance weight shifting for dynamic ankle strategy balance work and strengthening, cues for weight shifting, pt with incr difficulty with push off with RLE, needing verbal and demo cues x10 reps B               PT Short Term Goals - 02/14/20 1207      PT SHORT TERM GOAL #1   Title Patient will demo 10 deg of Passive internal rotation of bil hips to reduce excessive external rotation during gait    Baseline Internal rotation PROM: 0 deg R; -5 deg L    Time 4    Period  Weeks    Status New    Target Date 03/13/20      PT SHORT TERM GOAL #2   Title Patient will be able to perform heel raises 1 inch off the floor for 5 consecutive reps to improve calf strength to assist with push off    Baseline <1/2 inch- pt has excessive compensation and leans forward    Time 4    Period Weeks    Status New    Target Date 03/13/20             PT Long Term Goals - 02/14/20 1214      PT LONG TERM GOAL #1   Title Patient will be independent with ongoing/advanced HEP    Baseline HEP issued 02/14/20    Time 8    Period Weeks    Status New    Target Date 04/10/20      PT LONG TERM GOAL #2   Title Patient will be able to walk for 20-30 min without rest to improve walking endurance    Baseline no formal walking program    Time 8    Period Weeks    Status New    Target Date 04/10/20                 Plan - 02/28/20 1550    Clinical Impression Statement Today's skilled session focused on BLE strengthening and balance strategies with focus on ankle strategy work. Pt tolerated session well, needed cues on proper technique for exercises and most challenged by PF/ankle strengthening with RLE. Will continue to progress towards LTGs.    Personal Factors and Comorbidities Age;Time since onset of injury/illness/exacerbation;Past/Current Experience;Comorbidity 2    Examination-Activity Limitations Carry    Examination-Participation Restrictions Cleaning;Yard Work    [employer]chant [employer] officer Evolving/Moderate complexity    Rehab Potential Good    PT Frequency 2x / week    PT Duration 6 weeks    PT Next Visit Plan Issue bed HEP (SLR, SL hip abduction, clamshells, prone hip extensions, bridge, supine hooklying marching with abdominal contraction). ankle strategy/SLS work    PT Home Exercise Plan Access Code: [email or web address]: [email or web address]: 07/30/2021Prepared by: Gwenyth Bouillon PatelExercisesHooklying Single Knee to Chest Stretch - 1 x daily - 7  x weekly - 3 reps - 20 holdSupine Piriformis Stretch with Foot on Ground - 1 x daily - 7 x weekly - 3 reps - 20 holdSupine ITB Stretch - 1 x daily -  7 x weekly - 3 reps - 20 holdSupine Hamstring Stretch - 1 x daily - 7 x weekly - 3 reps - 20 holdStanding Soleus Stretch on Step - 1 x daily - 7 x weekly - 3 reps - 20 hold           Patient will benefit from skilled therapeutic intervention in order to improve the following deficits and impairments:  Abnormal gait, Decreased balance, Decreased endurance, Decreased range of motion, Decreased strength, Impaired sensation, Pain, Postural dysfunction  Visit Diagnosis: Muscle weakness (generalized)  Difficulty in walking, not elsewhere classified  Unsteadiness on feet     Problem List Patient Active Problem List   Diagnosis Date Noted  . Gastric ulcer 01/27/2020  . H/O colonoscopy with polypectomy 01/27/2020  . Protein-calorie malnutrition, severe 01/09/2020  . Weight loss 01/07/2020  . Abdominal pain 12/30/2019  . Nausea and vomiting 12/30/2019  . Weight loss, abnormal 12/30/2019  . Change in bowel habits 12/30/2019  . 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 Medication monitoring encounter 08/26/2019  . Peripheral neuropathy 08/07/2019  . Alcohol abuse 05/09/2019  . Hyponatremia 05/05/2019  . 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[place]) 04/04/2019  . Fusion of spine of thoracolumbar region 03/12/2019  . Status post thoracic spinal fusion 02/12/2019  . 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 Thoracic spinal stenosis 04/17/2017  . Painful orthopaedic hardware ([place]) 04/17/2017  . 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 ([place]) 04/17/2017  . Low back pain radiating to both legs 01/16/2017  . Esophageal reflux 02/10/2015  . Medicare annual wellness visit, subsequent 02/10/2015  . Personal history of colonic polyps 10/27/2012  . Testosterone deficiency  05/23/2012  . BPH (benign prostatic hyperplasia) 04/23/2012  . ED (erectile dysfunction) 04/23/2012  . Allergic state 10/25/2011  . Anxiety and depression 10/25/2011  . Asthma   . Diabetes mellitus without complication ([place])   . Hyperlipidemia   . Overweight   . Insomnia   . Fatigue   . Preventative health care   . DDD (degenerative disc disease), lumbosacral   . Thoracic degenerative disc disease   . HTN (hypertension)     Luis Brown, PT, DPT  02/28/2020, 3:52 PM  [facility] [address] [place], [place], [address] Phone: [phone]   Fax:  [phone]  Name: Luis Brown MRN: [id number] Date of Birth: 08/14/1945

## 2020-02-28 NOTE — Progress Notes (Addendum)
Patient here today for testosterone injection per Mackie Pai. 3mL given in right upper outer quadrant. Patient tolerated well.   Reviewed. Crosby Oyster [place]ling 2:43 PM 02/28/20  Reviewed and agree with administration.  Mackie Pai, PA-C

## 2020-02-29 ENCOUNTER — Other Ambulatory Visit: Payer: Self-pay | Admitting: Specialist

## 2020-03-01 ENCOUNTER — Other Ambulatory Visit: Payer: Self-pay | Admitting: Medical

## 2020-03-02 ENCOUNTER — Other Ambulatory Visit: Payer: Self-pay | Admitting: Specialist

## 2020-03-02 ENCOUNTER — Other Ambulatory Visit: Payer: Self-pay

## 2020-03-02 ENCOUNTER — Ambulatory Visit: Payer: Medicare Other

## 2020-03-02 DIAGNOSIS — G8929 Other chronic pain: Secondary | ICD-10-CM | POA: Diagnosis not present

## 2020-03-02 DIAGNOSIS — R262 Difficulty in walking, not elsewhere classified: Secondary | ICD-10-CM

## 2020-03-02 DIAGNOSIS — M6281 Muscle weakness (generalized): Secondary | ICD-10-CM

## 2020-03-02 DIAGNOSIS — M546 Pain in thoracic spine: Secondary | ICD-10-CM | POA: Diagnosis not present

## 2020-03-02 DIAGNOSIS — M545 Low back pain, unspecified: Secondary | ICD-10-CM

## 2020-03-02 DIAGNOSIS — M5415 Radiculopathy, thoracolumbar region: Secondary | ICD-10-CM

## 2020-03-02 DIAGNOSIS — R2681 Unsteadiness on feet: Secondary | ICD-10-CM

## 2020-03-02 NOTE — Therapy (Signed)
[facility] [address] [place], [place], [address] Phone: [phone]   Fax:  [phone]  Physical Therapy Treatment  Patient Details  Name: Luis Brown MRN: [id number] Date of Birth: [birth year] Referring Provider (PT): Dr. Penni Homans    Encounter Date: 03/02/2020   PT End of Session - 03/02/20 1305    Visit Number 4    Number of Visits 17    Date for PT Re-Evaluation 04/10/20    Authorization Type UHC [place] - Visit Number 4    Authorization - Number of Visits 10    Progress Note Due on Visit 10    PT Start Time 1015    PT Stop Time 1100    PT Time Calculation (min) 45 min    Equipment Utilized During Treatment Gait belt    Activity Tolerance Patient tolerated treatment well    Behavior During Therapy WFL for tasks assessed/performed           Past Medical History:  Diagnosis Date  . Acute pharyngitis 10/21/2013  . Allergy    grass and pollen  . Anxiety and depression 10/25/2011  . BPH (benign prostatic hyperplasia) 04/23/2012  . Chicken pox as a child  . DDD (degenerative disc disease)    cervical responds to steroid injections and low back required surgery  . DDD (degenerative disc disease), lumbosacral   . Diabetes mellitus    pre  . Dyspnea   . ED (erectile dysfunction) 04/23/2012  . Elevated BP   . 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 Esophageal reflux 02/10/2015  . Fatigue   . HTN (hypertension)   . Hyperglycemia    preDM   . Hyperlipidemia   . Insomnia   . Low back pain radiating to both legs 01/16/2017  . Measles as a child  . Medicare annual wellness visit, subsequent 02/10/2015  . Overweight(278.02)   . Peripheral neuropathy 08/07/2019  . Personal history of colonic polyps 10/27/2012   Follows with [facility]  . Pneumonia    " walking"  . Preventative health care   . 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  . Wears glasses     Past Surgical History:  Procedure  Laterality Date  . BACK SURGERY  2012 and 1994   Dr Margret Chance, screws and cage in low back  . BIOPSY  01/09/2020   Procedure: BIOPSY;  Surgeon: Otis Brace, MD;  [facility];  Service: Gastroenterology;;  . COLONOSCOPY WITH PROPOFOL N/A 01/09/2020   Procedure: COLONOSCOPY WITH PROPOFOL;  Surgeon: Otis Brace, MD;  [facility];  Service: Gastroenterology;  Laterality: N/A;  . ESOPHAGOGASTRODUODENOSCOPY (EGD) WITH PROPOFOL N/A 01/09/2020   Procedure: ESOPHAGOGASTRODUODENOSCOPY (EGD) WITH PROPOFOL;  Surgeon: Otis Brace, MD;  [facility];  Service: Gastroenterology;  Laterality: N/A;  . EYE SURGERY Bilateral    2016  . HARDWARE REVISION  03/12/2019   REVISION OF HARDWARE THORACIC TEN-THORACIC ELEVEN WITH APPLICATION OF ADDITIONAL RODS AND ROD SLEEVES THORACIC EIGHT-THORACIC TWELVE 12-LUMBAR ONE (N/A )  . LAMINECTOMY  02/12/2019   DECOMPRESSIVE LAMINECTOMY THORACIC NINE-THORACIC TEN AND THORACIC TEN-THORACIC ELEVEN, EXTENSION OF THORACOLUMBAR FUSION FROM THORACIC TEN TO THORACIC FIVE, LOCAL BONE GRAFT, ALLOGRAFT AND VIVIGEN (N/A)  . POLYPECTOMY  01/09/2020   Procedure: POLYPECTOMY;  Surgeon: Otis Brace, MD;  [facility];  Service: Gastroenterology;;  . SPINAL FUSION N/A 02/12/2019   Procedure: DECOMPRESSIVE LAMINECTOMY THORACIC NINE-THORACIC TEN AND THORACIC TEN-THORACIC ELEVEN, EXTENSION OF THORACOLUMBAR FUSION FROM THORACIC TEN TO THORACIC FIVE, LOCAL  BONE GRAFT, ALLOGRAFT AND VIVIGEN;  Surgeon: Jessy Oto, MD;  Location: [facility];  Service: Orthopedics;  Laterality: N/A;  DECOMPRESSIVE LAMINECTOMY THORACIC NINE-THORACIC TEN AND THORACIC TEN-THORACIC ELEVEN, EXTENSION OF TH  . SPINAL FUSION N/A 03/12/2019   Procedure: REVISION OF HARDWARE THORACIC TEN-THORACIC ELEVEN WITH APPLICATION OF ADDITIONAL RODS AND ROD SLEEVES THORACIC EIGHT-THORACIC TWELVE 12-LUMBAR ONE;  Surgeon: Jessy Oto, MD;  Location: [facility];  Service: Orthopedics;   Laterality: N/A;  . TONSILLECTOMY    . TONSILLECTOMY     as child  . torn rotator cuff  2010   right    There were no vitals filed for this visit.   Subjective Assessment - 03/02/20 1302    Subjective pt reports he had a busy weekend and didn't have chance to do lot of exercises.    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 Decreased conspicuity of ventralepidural abscess, epidural phlegmon and spinal canal narrowing atthe T4-5 level. T4-5 cord edema, unchanged. 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 Partially imaged posterior spinal fusion hardware. Please note lackof intravenous contrast and susceptibility artifact limitsevaluation. These results will be called to the ordering clinician orrepresentative by the Radiologist Assistant, and communicationdocumented in the PACS or [employer]. AP and lateral thoracic spine demonstrates obliteration of the T4-5 disc space, there is more separation of the T 5 pedicle screws within the vertebral body of T5 suggesting increased loosening of the screws at this level. IMPRESSION:1. Fracture and loosening of both T5 pedicle screws which traversethe T5 superior endplate. 2. Loosening of the right T9 pedicle screw which traverses the rightcostovertebral junction. Question subtle loosening also of the otherintervening right thoracic pedicle screws T6 through T8. 3. Sequelae of discitis osteomyelitis suspected at T9-T10 withdeveloping interbody ankylosis. Severe bilateral T9 osseous neuralforaminal stenosis. Moderate to severe bilateral T10 osseousforaminal stenosis. 4. T11 through visible upper lumbar pedicle screws are intactwithout loosening. There is developing right side posterior elementankylosis at T11-T12, and developing bilateral posterior elementankylosis at T12-L1.    Patient Stated Goals "get stronger", some weight gain/muscle gain    Currently in Pain?  No/denies           Excessive supination noted in R foot with stance phase Trial of OTC AFO on patient's R foot to see if it improve supination. No significant improvement noted   Mid foot restricted with plantarflexion Rear foot/rear foot restrcted with eversion  Manual therapy Grade IV subtalar eversion mobilization Grade IV subtalar PA mobilization Grade IV mid foot mobilization Grade IV cuboid mobilization Grade IV distal fibular head PA mobilization  Went over resistive band plantarflexion exercise with patient to improve plantarflexors strength bil.                         PT Short Term Goals - 02/14/20 1207      PT SHORT TERM GOAL #1   Title Patient will demo 10 deg of Passive internal rotation of bil hips to reduce excessive external rotation during gait    Baseline Internal rotation PROM: 0 deg R; -5 deg L    Time 4    Period Weeks    Status New    Target Date 03/13/20      PT SHORT TERM GOAL #2   Title Patient will be able to perform heel raises 1 inch off the floor for 5 consecutive reps to improve calf strength to assist with  push off    Baseline <1/2 inch- pt has excessive compensation and leans forward    Time 4    Period Weeks    Status New    Target Date 03/13/20             PT Long Term Goals - 02/14/20 1214      PT LONG TERM GOAL #1   Title Patient will be independent with ongoing/advanced HEP    Baseline HEP issued 02/14/20    Time 8    Period Weeks    Status New    Target Date 04/10/20      PT LONG TERM GOAL #2   Title Patient will be able to walk for 20-30 min without rest to improve walking endurance    Baseline no formal walking program    Time 8    Period Weeks    Status New    Target Date 04/10/20                 Plan - 03/02/20 1303    Clinical Impression Statement Patient continues to have excessive foot supination in R foot during stance phase where he is walking on lateral aspect of the foot.  Patient also has decreased push off on the R foot during late stance phase. Trial of AFO to improve supination was not successful (OTC AFO was used for trial). Patient demonstrated improved supination after manual therapy today in R foot and pt reported improved stiffness and stability in R foot. Patient has weakness in bil plantarflexors and was advised to work on resistive band plantarflexion at home per previously givn HEP to improve strength.    Personal Factors and Comorbidities Age;Time since onset of injury/illness/exacerbation;Past/Current Experience;Comorbidity 2    Examination-Activity Limitations Carry    Examination-Participation Restrictions Cleaning;Yard Work    [employer]chant [employer] officer Evolving/Moderate complexity    Rehab Potential Good    PT Frequency 2x / week    PT Duration 6 weeks    PT Next Visit Plan Issue bed HEP (SLR, SL hip abduction, clamshells, prone hip extensions, bridge, supine hooklying marching with abdominal contraction). ankle strategy/SLS work    PT Home Exercise Plan Access Code: [email or web address]: [email or web address]: 07/30/2021Prepared by: Gwenyth Bouillon PatelExercisesHooklying Single Knee to Chest Stretch - 1 x daily - 7 x weekly - 3 reps - 20 holdSupine Piriformis Stretch with Foot on Ground - 1 x daily - 7 x weekly - 3 reps - 20 holdSupine ITB Stretch - 1 x daily - 7 x weekly - 3 reps - 20 holdSupine Hamstring Stretch - 1 x daily - 7 x weekly - 3 reps - 20 holdStanding Soleus Stretch on Step - 1 x daily - 7 x weekly - 3 reps - 20 hold           Patient will benefit from skilled therapeutic intervention in order to improve the following deficits and impairments:  Abnormal gait, Decreased balance, Decreased endurance, Decreased range of motion, Decreased strength, Impaired sensation, Pain, Postural dysfunction  Visit Diagnosis: Muscle weakness (generalized)  Difficulty in walking, not elsewhere classified  Unsteadiness on feet  Pain in  thoracic spine  Chronic 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 Gastric ulcer 01/27/2020  . H/O colonoscopy with polypectomy 01/27/2020  . Protein-calorie malnutrition, severe 01/09/2020  . Weight loss 01/07/2020  . Abdominal pain 12/30/2019  . Nausea and vomiting 12/30/2019  .  Weight loss, abnormal 12/30/2019  . Change in bowel habits 12/30/2019  . 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 Medication monitoring encounter 08/26/2019  . Peripheral neuropathy 08/07/2019  . Alcohol abuse 05/09/2019  . Hyponatremia 05/05/2019  . 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[place]) 04/04/2019  . Fusion of spine of thoracolumbar region 03/12/2019  . Status post thoracic spinal fusion 02/12/2019  . 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 Thoracic spinal stenosis 04/17/2017  . Painful orthopaedic hardware ([place]) 04/17/2017  . 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 ([place]) 04/17/2017  . Low back pain radiating to both legs 01/16/2017  . Esophageal reflux 02/10/2015  . Medicare annual wellness visit, subsequent 02/10/2015  . Personal history of colonic polyps 10/27/2012  . 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  . BPH (benign prostatic hyperplasia) 04/23/2012  . ED (erectile dysfunction) 04/23/2012  . Allergic state 10/25/2011  . Anxiety and depression 10/25/2011  . Asthma   . Diabetes mellitus without complication ([place])   . Hyperlipidemia   . Overweight   . Insomnia   . Fatigue   . Preventative health care   . DDD (degenerative disc disease), lumbosacral   . Thoracic degenerative disc disease   . HTN (hypertension)     Kerrie Pleasure, PT 03/02/2020, 1:07 PM  [facility] [address] [place], [place], [address] Phone: [phone]   Fax:   [phone]  Name: Luis Brown MRN: [id number] Date of Birth: 15-Jul-1946

## 2020-03-02 NOTE — Telephone Encounter (Signed)
Pls advise.  

## 2020-03-03 ENCOUNTER — Other Ambulatory Visit: Payer: Self-pay | Admitting: Specialist

## 2020-03-03 ENCOUNTER — Encounter: Payer: Self-pay | Admitting: Specialist

## 2020-03-03 MED ORDER — HYDROCODONE-ACETAMINOPHEN 7.5-325 MG PO TABS: 1.0000 | ORAL_TABLET | Freq: Four times a day (QID) | ORAL | 0 refills | Status: DC | PRN

## 2020-03-03 NOTE — Telephone Encounter (Signed)
Nitka patient 

## 2020-03-03 NOTE — Telephone Encounter (Signed)
JN pt

## 2020-03-06 ENCOUNTER — Other Ambulatory Visit: Payer: Self-pay | Admitting: Medical

## 2020-03-06 ENCOUNTER — Ambulatory Visit: Payer: Medicare Other

## 2020-03-07 ENCOUNTER — Other Ambulatory Visit: Payer: Self-pay | Admitting: Specialist

## 2020-03-09 ENCOUNTER — Ambulatory Visit: Payer: Medicare Other | Admitting: Physical Therapy

## 2020-03-09 ENCOUNTER — Other Ambulatory Visit: Payer: Self-pay | Admitting: Specialist

## 2020-03-09 MED ORDER — HYDROCODONE-ACETAMINOPHEN 7.5-325 MG PO TABS: 1.0000 | ORAL_TABLET | Freq: Four times a day (QID) | ORAL | 0 refills | Status: DC | PRN

## 2020-03-09 NOTE — Telephone Encounter (Signed)
Dr. Louanne Skye pt

## 2020-03-10 ENCOUNTER — Encounter: Payer: Self-pay | Admitting: Radiology

## 2020-03-11 ENCOUNTER — Encounter: Payer: Self-pay | Admitting: Specialist

## 2020-03-12 ENCOUNTER — Other Ambulatory Visit: Payer: Self-pay | Admitting: Specialist

## 2020-03-12 MED ORDER — HYDROCODONE-ACETAMINOPHEN 7.5-325 MG PO TABS: 1.0000 | ORAL_TABLET | ORAL | 0 refills | Status: DC | PRN

## 2020-03-13 ENCOUNTER — Ambulatory Visit: Payer: Medicare Other

## 2020-03-15 ENCOUNTER — Other Ambulatory Visit: Payer: Self-pay | Admitting: Medical

## 2020-03-16 ENCOUNTER — Ambulatory Visit: Payer: Medicare Other

## 2020-03-16 DIAGNOSIS — Z8719 Personal history of other diseases of the digestive system: Secondary | ICD-10-CM | POA: Diagnosis not present

## 2020-03-16 DIAGNOSIS — Z8711 Personal history of peptic ulcer disease: Secondary | ICD-10-CM | POA: Diagnosis not present

## 2020-03-16 NOTE — Telephone Encounter (Addendum)
Last written: 01/10/20  Last ov: 01/24/20 Next ov: 03/20/20 with [name] Contract: 01/25/18 UDS: 01/25/18  Pt not up to date on contract. He was Dr. [name] pt for long time. He briefly switched to me. But I think he may be seeing her again. I think he has virtual visit with her tomorrow. Will you see Dr. B or am I his pcp. If with me then ant him to update contract and uds. If with Dr. B see how she wants to approach. His last uds and contract 2019.

## 2020-03-18 ENCOUNTER — Other Ambulatory Visit: Payer: Self-pay | Admitting: Specialist

## 2020-03-18 ENCOUNTER — Encounter: Payer: Self-pay | Admitting: Medical

## 2020-03-18 ENCOUNTER — Encounter: Payer: Self-pay | Admitting: Specialist

## 2020-03-18 NOTE — Telephone Encounter (Signed)
Requesting: valium Contract: n/a UDS:n/a Last Visit: 01/22/20 Next Visit: 03/27/20 Last Refill:01/10/20  Please Advise

## 2020-03-19 ENCOUNTER — Other Ambulatory Visit: Payer: Self-pay

## 2020-03-19 ENCOUNTER — Other Ambulatory Visit: Payer: Self-pay | Admitting: Specialist

## 2020-03-19 ENCOUNTER — Ambulatory Visit: Payer: Medicare Other | Attending: Family Medicine

## 2020-03-19 DIAGNOSIS — M5415 Radiculopathy, thoracolumbar region: Secondary | ICD-10-CM | POA: Diagnosis not present

## 2020-03-19 DIAGNOSIS — M545 Low back pain, unspecified: Secondary | ICD-10-CM

## 2020-03-19 DIAGNOSIS — M546 Pain in thoracic spine: Secondary | ICD-10-CM | POA: Diagnosis not present

## 2020-03-19 DIAGNOSIS — R262 Difficulty in walking, not elsewhere classified: Secondary | ICD-10-CM | POA: Diagnosis not present

## 2020-03-19 DIAGNOSIS — R2681 Unsteadiness on feet: Secondary | ICD-10-CM | POA: Insufficient documentation

## 2020-03-19 DIAGNOSIS — G8929 Other chronic pain: Secondary | ICD-10-CM | POA: Insufficient documentation

## 2020-03-19 DIAGNOSIS — M6281 Muscle weakness (generalized): Secondary | ICD-10-CM | POA: Insufficient documentation

## 2020-03-19 MED ORDER — HYDROCODONE-ACETAMINOPHEN 5-325 MG PO TABS: 1.0000 | ORAL_TABLET | Freq: Four times a day (QID) | ORAL | 0 refills | Status: DC | PRN

## 2020-03-19 NOTE — Therapy (Signed)
[facility] [address] [place], [place], [address] Phone: [phone]   Fax:  [phone]  Physical Therapy Treatment  Patient Details  Name: Luis Brown MRN: [id number] Date of Birth: 09-03-1945 Referring Provider (PT): Dr. Penni Homans    Encounter Date: 03/19/2020   PT End of Session - 03/19/20 1537    Visit Number 5    Number of Visits 17    Date for PT Re-Evaluation 04/10/20    Authorization Type [employer] [place] - Visit Number 5    Authorization - Number of Visits 10    Progress Note Due on Visit 10    PT Start Time 3818    PT Stop Time 1530    PT Time Calculation (min) 45 min    Equipment Utilized During Treatment Gait belt    Activity Tolerance Patient tolerated treatment well    Behavior During Therapy [facility] for tasks assessed/performed           Past Medical History:  Diagnosis Date  . Acute pharyngitis 10/21/2013  . Allergy    grass and pollen  . Anxiety and depression 10/25/2011  . BPH (benign prostatic hyperplasia) 04/23/2012  . Chicken pox as a child  . DDD (degenerative disc disease)    cervical responds to steroid injections and low back required surgery  . DDD (degenerative disc disease), lumbosacral   . Diabetes mellitus    pre  . Dyspnea   . ED (erectile dysfunction) 04/23/2012  . Elevated BP   . 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 Esophageal reflux 02/10/2015  . Fatigue   . HTN (hypertension)   . Hyperglycemia    preDM   . Hyperlipidemia   . Insomnia   . Low back pain radiating to both legs 01/16/2017  . Measles as a child  . Medicare annual wellness visit, subsequent 02/10/2015  . Overweight(278.02)   . Peripheral neuropathy 08/07/2019  . Personal history of colonic polyps 10/27/2012   Follows with [facility]  . Pneumonia    " walking"  . Preventative health care   . 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  . Wears glasses     Past Surgical History:  Procedure  Laterality Date  . BACK SURGERY  2012 and 1994   Dr Margret Chance, screws and cage in low back  . BIOPSY  01/09/2020   Procedure: BIOPSY;  Surgeon: Otis Brace, MD;  Location: [facility];  Service: Gastroenterology;;  . COLONOSCOPY WITH PROPOFOL N/A 01/09/2020   Procedure: COLONOSCOPY WITH PROPOFOL;  Surgeon: Otis Brace, MD;  Location: [facility];  Service: Gastroenterology;  Laterality: N/A;  . ESOPHAGOGASTRODUODENOSCOPY (EGD) WITH PROPOFOL N/A 01/09/2020   Procedure: ESOPHAGOGASTRODUODENOSCOPY (EGD) WITH PROPOFOL;  Surgeon: Otis Brace, MD;  Location: [facility];  Service: Gastroenterology;  Laterality: N/A;  . EYE SURGERY Bilateral    2016  . HARDWARE REVISION  03/12/2019   REVISION OF HARDWARE THORACIC TEN-THORACIC ELEVEN WITH APPLICATION OF ADDITIONAL RODS AND ROD SLEEVES THORACIC EIGHT-THORACIC TWELVE 12-LUMBAR ONE (N/A )  . LAMINECTOMY  02/12/2019   DECOMPRESSIVE LAMINECTOMY THORACIC NINE-THORACIC TEN AND THORACIC TEN-THORACIC ELEVEN, EXTENSION OF THORACOLUMBAR FUSION FROM THORACIC TEN TO THORACIC FIVE, LOCAL BONE GRAFT, ALLOGRAFT AND VIVIGEN (N/A)  . POLYPECTOMY  01/09/2020   Procedure: POLYPECTOMY;  Surgeon: Otis Brace, MD;  Location: [facility];  Service: Gastroenterology;;  . SPINAL FUSION N/A 02/12/2019   Procedure: DECOMPRESSIVE LAMINECTOMY THORACIC NINE-THORACIC TEN AND THORACIC TEN-THORACIC ELEVEN, EXTENSION OF THORACOLUMBAR FUSION FROM THORACIC TEN TO THORACIC FIVE, LOCAL  BONE GRAFT, ALLOGRAFT AND VIVIGEN;  Surgeon: Jessy Oto, MD;  Location: [facility];  Service: Orthopedics;  Laterality: N/A;  DECOMPRESSIVE LAMINECTOMY THORACIC NINE-THORACIC TEN AND THORACIC TEN-THORACIC ELEVEN, EXTENSION OF TH  . SPINAL FUSION N/A 03/12/2019   Procedure: REVISION OF HARDWARE THORACIC TEN-THORACIC ELEVEN WITH APPLICATION OF ADDITIONAL RODS AND ROD SLEEVES THORACIC EIGHT-THORACIC TWELVE 12-LUMBAR ONE;  Surgeon: Jessy Oto, MD;  Location: [facility];  Service: Orthopedics;   Laterality: N/A;  . TONSILLECTOMY    . TONSILLECTOMY     as child  . torn rotator cuff  2010   right    There were no vitals filed for this visit.   Subjective Assessment - 03/19/20 1453    Subjective Pt [place] the beach [place] of weeks. He did exercises as much as he could.    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 Decreased conspicuity of ventralepidural abscess, epidural phlegmon and spinal canal narrowing atthe T4-5 level. T4-5 cord edema, unchanged. 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 Partially imaged posterior spinal fusion hardware. Please note lackof intravenous contrast and susceptibility artifact limitsevaluation. These results will be called to the ordering clinician orrepresentative by the Radiologist Assistant, and communicationdocumented in the PACS or [employer]. AP and lateral thoracic spine demonstrates obliteration of the T4-5 disc space, there is more separation of the T 5 pedicle screws within the vertebral body of T5 suggesting increased loosening of the screws at this level. IMPRESSION:1. Fracture and loosening of both T5 pedicle screws which traversethe T5 superior endplate. 2. Loosening of the right T9 pedicle screw which traverses the rightcostovertebral junction. Question subtle loosening also of the otherintervening right thoracic pedicle screws T6 through T8. 3. Sequelae of discitis osteomyelitis suspected at T9-T10 withdeveloping interbody ankylosis. Severe bilateral T9 osseous neuralforaminal stenosis. Moderate to severe bilateral T10 osseousforaminal stenosis. 4. T11 through visible upper lumbar pedicle screws are intactwithout loosening. There is developing right side posterior elementankylosis at T11-T12, and developing bilateral posterior elementankylosis at T12-L1.    Patient Stated Goals "get stronger", some weight gain/muscle gain     Currently in Pain? No/denies                Neuro Re-ed L sidelying: anterior elevation and posterio depression of pelvis: isometric, Passive, AA and Active motions. Manually resisted with slow reversals. Also performed isometric and manually resisted extension/knee extension/ankle plantarflexionn while patient holds posterior depression of L pelvis isometrically: 5 x 5 rounds  Supine hooklying manually resisted plantarflexion: 20x with stabilizing rear foot in neutral  Standing tandem with L foot behind, pt asked to lean forward using L calf muscle against, PT's resistance: 5 x 10" holds  Walking tandem: 1 HHA: 5 laps of 10 feet  Tandem balance: 3 x 10" R and L                      PT Short Term Goals - 02/14/20 1207      PT SHORT TERM GOAL #1   Title Patient will demo 10 deg of Passive internal rotation of bil hips to reduce excessive external rotation during gait    Baseline Internal rotation PROM: 0 deg R; -5 deg L    Time 4    Period Weeks    Status New    Target Date 03/13/20      PT SHORT TERM GOAL #2   Title Patient will  be able to perform heel raises 1 inch off the floor for 5 consecutive reps to improve calf strength to assist with push off    Baseline <1/2 inch- pt has excessive compensation and leans forward    Time 4    Period Weeks    Status New    Target Date 03/13/20             PT Long Term Goals - 02/14/20 1214      PT LONG TERM GOAL #1   Title Patient will be independent with ongoing/advanced HEP    Baseline HEP issued 02/14/20    Time 8    Period Weeks    Status New    Target Date 04/10/20      PT LONG TERM GOAL #2   Title Patient will be able to walk for 20-30 min without rest to improve walking endurance    Baseline no formal walking program    Time 8    Period Weeks    Status New    Target Date 04/10/20                 Plan - 03/19/20 1536    Clinical Impression Statement Today's session was focused on  improving neuromuscular control to improve push off during the gait on R LE. Patient has signficant weakness in R plantarflexors that affects his push off during the gait.    Personal Factors and Comorbidities Age;Time since onset of injury/illness/exacerbation;Past/Current Experience;Comorbidity 2    Examination-Activity Limitations Carry    Examination-Participation Restrictions Cleaning;Yard Work    [employer] Evolving/Moderate complexity    Rehab Potential Good    PT Frequency 2x / week    PT Duration 6 weeks    PT Next Visit Plan Issue bed HEP (SLR, SL hip abduction, clamshells, prone hip extensions, bridge, supine hooklying marching with abdominal contraction). ankle strategy/SLS work    PT Home Exercise Plan Access Code: [email or web address]: [email or web address]: 07/30/2021Prepared by: Gwenyth Bouillon PatelExercisesHooklying Single Knee to Chest Stretch - 1 x daily - 7 x weekly - 3 reps - 20 holdSupine Piriformis Stretch with Foot on Ground - 1 x daily - 7 x weekly - 3 reps - 20 holdSupine ITB Stretch - 1 x daily - 7 x weekly - 3 reps - 20 holdSupine Hamstring Stretch - 1 x daily - 7 x weekly - 3 reps - 20 holdStanding Soleus Stretch on Step - 1 x daily - 7 x weekly - 3 reps - 20 hold           Patient will benefit from skilled therapeutic intervention in order to improve the following deficits and impairments:  Abnormal gait, Decreased balance, Decreased endurance, Decreased range of motion, Decreased strength, Impaired sensation, Pain, Postural dysfunction  Visit Diagnosis: Muscle weakness (generalized)  Difficulty in walking, not elsewhere classified  Unsteadiness on feet  Pain in thoracic spine  Chronic 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 Gastric ulcer 01/27/2020  . H/O colonoscopy with polypectomy 01/27/2020  . Protein-calorie  malnutrition, severe 01/09/2020  . Weight loss 01/07/2020  . Abdominal pain 12/30/2019  . Nausea and vomiting 12/30/2019  . Weight loss, abnormal 12/30/2019  . Change in bowel habits 12/30/2019  . 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 Medication monitoring encounter 08/26/2019  . Peripheral neuropathy 08/07/2019  . Alcohol abuse 05/09/2019  . Hyponatremia 05/05/2019  . Subacute osteomyelitis  of thoracic spine ([place]) 04/04/2019  . Fusion of spine of thoracolumbar region 03/12/2019  . Status post thoracic spinal fusion 02/12/2019  . 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 Thoracic spinal stenosis 04/17/2017  . Painful orthopaedic hardware ([place]) 04/17/2017  . 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 ([place]) 04/17/2017  . Low back pain radiating to both legs 01/16/2017  . Esophageal reflux 02/10/2015  . Medicare annual wellness visit, subsequent 02/10/2015  . Personal history of colonic polyps 10/27/2012  . 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  . BPH (benign prostatic hyperplasia) 04/23/2012  . ED (erectile dysfunction) 04/23/2012  . Allergic state 10/25/2011  . Anxiety and depression 10/25/2011  . Asthma   . Diabetes mellitus without complication ([place])   . Hyperlipidemia   . Overweight   . Insomnia   . Fatigue   . Preventative health care   . DDD (degenerative disc disease), lumbosacral   . Thoracic degenerative disc disease   . HTN (hypertension)     Kerrie Pleasure, PT 03/19/2020, 3:42 PM  [facility] [address] [place], [place], [address] Phone: [phone]   Fax:  [phone]  Name: Luis Brown MRN: [id number] Date of Birth: 1946-03-08

## 2020-03-20 ENCOUNTER — Encounter: Payer: Self-pay | Admitting: Specialist

## 2020-03-20 ENCOUNTER — Other Ambulatory Visit: Payer: Self-pay | Admitting: Specialist

## 2020-03-20 ENCOUNTER — Telehealth (INDEPENDENT_AMBULATORY_CARE_PROVIDER_SITE_OTHER): Payer: Medicare Other | Admitting: Family Medicine

## 2020-03-20 ENCOUNTER — Encounter: Payer: Self-pay | Admitting: Radiology

## 2020-03-20 VITALS — BP 121/72

## 2020-03-20 DIAGNOSIS — R739 Hyperglycemia, unspecified: Secondary | ICD-10-CM | POA: Diagnosis not present

## 2020-03-20 DIAGNOSIS — I10 Essential (primary) hypertension: Secondary | ICD-10-CM

## 2020-03-20 DIAGNOSIS — K259 Gastric ulcer, unspecified as acute or chronic, without hemorrhage or perforation: Secondary | ICD-10-CM | POA: Diagnosis not present

## 2020-03-20 DIAGNOSIS — E43 Unspecified severe protein-calorie malnutrition: Secondary | ICD-10-CM | POA: Diagnosis not present

## 2020-03-20 NOTE — Telephone Encounter (Signed)
JN pt

## 2020-03-20 NOTE — Telephone Encounter (Signed)
Rx for 5 mg tablets prescribed on 9/2, making an effort to start to get him off narcotics nearly one year post op. Will not refill till 9/9

## 2020-03-22 DIAGNOSIS — R739 Hyperglycemia, unspecified: Secondary | ICD-10-CM | POA: Insufficient documentation

## 2020-03-22 NOTE — Assessment & Plan Note (Signed)
Check [id number] with next blood draw, minimize simple carbs. Increase exercise as tolerated. Continue current meds

## 2020-03-22 NOTE — Progress Notes (Signed)
Virtual Visit via Video Note  I connected with Luis Brown on 03/20/20 at  9:00 AM EDT by a video enabled telemedicine application and verified that I am speaking with the correct person using two identifiers.  Location: Patient: home, patient and provider are in visit Provider: home   I discussed the limitations of evaluation and management by telemedicine and the availability of in person appointments. The patient expressed understanding and agreed to proceed. Kem Boroughs, CMA was able to get the patient set up on a video visit    Subjective:    [name], male    DOB: [birth year], 74 y.o.   MRN: [id number]  Chief Complaint  Patient presents with  . 6 to 8 week follow up    HPI Patient is in today for follow up on chronic medical concerns. He is feeling much better after having his gastric ulcer treated. He is eating better and his appetite is better. He is trying to increase the protein in his diet. He has put on some weight. He denies any new concerns but he does continue to have back pain and he is following with Dr Louanne Skye and Dr Michail Sermon. Denies CP/palp/SOB/HA/congestion/fevers or GU c/o. Taking meds as prescribed  Past Medical History:  Diagnosis Date  . Acute pharyngitis 10/21/2013  . Allergy    grass and pollen  . Anxiety and depression 10/25/2011  . BPH (benign prostatic hyperplasia) 04/23/2012  . Chicken pox as a child  . DDD (degenerative disc disease)    cervical responds to steroid injections and low back required surgery  . DDD (degenerative disc disease), lumbosacral   . Diabetes mellitus    pre  . Dyspnea   . ED (erectile dysfunction) 04/23/2012  . Elevated BP   . 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 Esophageal reflux 02/10/2015  . Fatigue   . HTN (hypertension)   . Hyperglycemia    preDM   . Hyperlipidemia   . Insomnia   . Low back pain radiating to both legs 01/16/2017  . Measles as a child  . Medicare annual wellness visit, subsequent  02/10/2015  . Overweight(278.02)   . Peripheral neuropathy 08/07/2019  . Personal history of colonic polyps 10/27/2012   Follows with [facility]  . Pneumonia    " walking"  . Preventative health care   . 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  . Wears glasses     Past Surgical History:  Procedure Laterality Date  . BACK SURGERY  2012 and 1994   Dr Margret Chance, screws and cage in low back  . BIOPSY  01/09/2020   Procedure: BIOPSY;  Surgeon: Otis Brace, MD;  Location: [facility];  Service: Gastroenterology;;  . COLONOSCOPY WITH PROPOFOL N/A 01/09/2020   Procedure: COLONOSCOPY WITH PROPOFOL;  Surgeon: Otis Brace, MD;  Location: [facility];  Service: Gastroenterology;  Laterality: N/A;  . ESOPHAGOGASTRODUODENOSCOPY (EGD) WITH PROPOFOL N/A 01/09/2020   Procedure: ESOPHAGOGASTRODUODENOSCOPY (EGD) WITH PROPOFOL;  Surgeon: Otis Brace, MD;  Location: [facility];  Service: Gastroenterology;  Laterality: N/A;  . EYE SURGERY Bilateral    2016  . HARDWARE REVISION  03/12/2019   REVISION OF HARDWARE THORACIC TEN-THORACIC ELEVEN WITH APPLICATION OF ADDITIONAL RODS AND ROD SLEEVES THORACIC EIGHT-THORACIC TWELVE 12-LUMBAR ONE (N/A )  . LAMINECTOMY  02/12/2019   DECOMPRESSIVE LAMINECTOMY THORACIC NINE-THORACIC TEN AND THORACIC TEN-THORACIC ELEVEN, EXTENSION OF THORACOLUMBAR FUSION FROM THORACIC TEN TO THORACIC FIVE, LOCAL BONE GRAFT, ALLOGRAFT AND VIVIGEN (N/A)  . POLYPECTOMY  01/09/2020   Procedure: POLYPECTOMY;  Surgeon: Otis Brace, MD;  Location: [facility];  Service: Gastroenterology;;  . SPINAL FUSION N/A 02/12/2019   Procedure: DECOMPRESSIVE LAMINECTOMY THORACIC NINE-THORACIC TEN AND THORACIC TEN-THORACIC ELEVEN, EXTENSION OF THORACOLUMBAR FUSION FROM THORACIC TEN TO THORACIC FIVE, LOCAL BONE GRAFT, ALLOGRAFT AND VIVIGEN;  Surgeon: Jessy Oto, MD;  Location: [facility];  Service: Orthopedics;  Laterality: N/A;  DECOMPRESSIVE LAMINECTOMY THORACIC NINE-THORACIC TEN AND  THORACIC TEN-THORACIC ELEVEN, EXTENSION OF TH  . SPINAL FUSION N/A 03/12/2019   Procedure: REVISION OF HARDWARE THORACIC TEN-THORACIC ELEVEN WITH APPLICATION OF ADDITIONAL RODS AND ROD SLEEVES THORACIC EIGHT-THORACIC TWELVE 12-LUMBAR ONE;  Surgeon: Jessy Oto, MD;  Location: [place];  Service: Orthopedics;  Laterality: N/A;  . TONSILLECTOMY    . TONSILLECTOMY     as child  . torn rotator cuff  2010   right    Family History  Problem Relation Age of Onset  . Hypertension Mother   . Diabetes Mother        type 2  . Cancer Mother 60       breast in remission  . Emphysema Father        smoker  . COPD Father        smoker  . Stroke Father 45       mini  . Heart disease Father   . Hypertension Sister   . Hyperlipidemia Sister   . Scoliosis Sister   . Osteoporosis Sister   . Hypertension Maternal Grandmother   . Scoliosis Maternal Grandmother   . Heart disease Maternal Grandfather   . Heart disease Paternal Grandfather        smoker  . Heart disease Daughter     Social History   Socioeconomic History  . Marital status: Married    Spouse name: Not on file  . Number of children: Not on file  . Years of education: Not on file  . Highest education level: Not on file  Occupational History  . Not on file  Tobacco Use  . Smoking status: Former Smoker    Packs/day: 1.00    Years: 20.00    Pack years: 20.00    Types: Cigarettes    Quit date: 07/18/1989    Years since quitting: 30.6  . Smokeless tobacco: Never Used  . Tobacco comment: off and on smoking for the 20 yrs  Vaping Use  . Vaping Use: Never used  Substance and Sexual Activity  . Alcohol use: Yes    Comment: drink or two a day- mixed drink  . Drug use: No  . Sexual activity: Yes    Comment: lives with wife, still working, no dietary restrictions, continues to exercise intermittently  Other Topics Concern  . Not on file  Social History Narrative  . Not on file   Social Determinants of Health   Financial  Resource Strain: Low Risk   . Difficulty of Paying Living Expenses: Not hard at all  Food Insecurity:   . Worried About Charity fundraiser in the Last Year: Not on file  . Ran Out of Food in the Last Year: Not on file  Transportation Needs: No Transportation Needs  . Lack of Transportation (Medical): No  . Lack of Transportation (Non-Medical): No  Physical Activity:   . Days of Exercise per Week: Not on file  . Minutes of Exercise per Session: Not on file  Stress:   . Feeling of Stress : Not on file  Social Connections:   . Frequency of Communication with  Friends and Family: Not on file  . Frequency of Social Gatherings with Friends and Family: Not on file  . Attends Religious Services: Not on file  . Active Member of Clubs or Organizations: Not on file  . Attends Archivist Meetings: Not on file  . Marital Status: Not on file  Intimate Partner Violence:   . Fear of Current or Ex-Partner: Not on file  . Emotionally Abused: Not on file  . Physically Abused: Not on file  . Sexually Abused: Not on file    Outpatient Medications Prior to Visit  Medication Sig Dispense Refill  . atorvastatin (LIPITOR) 10 MG tablet Take 1 tablet (10 mg total) by mouth daily. 30 tablet 3  . busPIRone (BUSPAR) 7.5 MG tablet 1-2 TAB BY MOUTH 3 TIMES A DAY FOR ANXIETY (Patient taking differently: Take 7.5 mg by mouth 3 (three) times daily. ) 540 tablet 1  . cetirizine (ZYRTEC) 10 MG tablet Take 10 mg by mouth daily. In the morning    . diazepam (VALIUM) 10 MG tablet TAKE 1 TABLET(10 MG) BY MOUTH AT BEDTIME AS NEEDED 30 tablet 0  . doxycycline (VIBRA-TABS) 100 MG tablet Take 1 tablet (100 mg total) by mouth 2 (two) times daily. 60 tablet 5  . ferrous fumarate (FERRETTS) 325 (106 Fe) MG TABS tablet TAKE 1 TABLET (106 MG OF IRON TOTAL) BY MOUTH DAILY. 30 tablet 2  . glucose blood (CONTOUR NEXT TEST) test strip Check blood sugar once daily 100 each 12  . HYDROcodone-acetaminophen (NORCO/VICODIN)  5-325 MG tablet Take 1 tablet by mouth every 6 (six) hours as needed for moderate pain. 30 tablet 0  . JANUVIA 50 MG tablet TAKE 1 TABLET BY MOUTH EVERY DAY 30 tablet 3  . lisinopril (ZESTRIL) 5 MG tablet Take 0.5 tablets (2.5 mg total) by mouth daily.    . metFORMIN (GLUCOPHAGE) 1000 MG tablet Take 1 tablet (1,000 mg total) by mouth 2 (two) times daily with a meal. 180 tablet 0  . Multiple Vitamin (MULTIVITAMIN WITH MINERALS) TABS tablet Take 1 tablet by mouth daily.    . ondansetron (ZOFRAN) 4 MG tablet Take 1 tablet (4 mg total) by mouth every 8 (eight) hours as needed for nausea or vomiting. 20 tablet 0  . pantoprazole (PROTONIX) 40 MG tablet TAKE 1 TABLET BY MOUTH TWICE A DAY BEFORE A MEAL 60 tablet 1  . pregabalin (LYRICA) 75 MG capsule TAKE 1 CAPSULE BY MOUTH TWICE A DAY 60 capsule 3  . sucralfate (CARAFATE) 1 g tablet Take 1 tablet (1 g total) by mouth 4 (four) times daily -  with meals and at bedtime. 120 tablet 2  . tamsulosin (FLOMAX) 0.4 MG CAPS capsule TAKE 2 CAPSULES BY MOUTH EVERY DAY 180 capsule 0  . testosterone cypionate (DEPOTESTOSTERONE CYPIONATE) 200 MG/ML injection Inject 1 mL (200 mg total) into the muscle every 14 (fourteen) days. 2 mL 5  . tiZANidine (ZANAFLEX) 4 MG tablet TAKE 1.5 TABLETS (6 MG TOTAL) BY MOUTH EVERY 6 (SIX) HOURS AS NEEDED FOR MUSCLE SPASMS. 180 tablet 3  . venlafaxine XR (EFFEXOR-XR) 150 MG 24 hr capsule Take 1 capsule (150 mg total) by mouth daily with breakfast. 90 capsule 1  . HYDROcodone-acetaminophen (NORCO) 7.5-325 MG tablet Take 1 tablet by mouth every 4 (four) hours as needed for up to 5 days for moderate pain. 30 tablet 0   No facility-administered medications prior to visit.    No Known Allergies  Review of Systems  Constitutional: Positive for  malaise/fatigue. Negative for fever.  HENT: Negative for congestion.   Eyes: Negative for blurred vision.  Respiratory: Negative for shortness of breath.   Cardiovascular: Negative for chest pain,  palpitations and leg swelling.  Gastrointestinal: Negative for abdominal pain, blood in stool and nausea.  Genitourinary: Negative for dysuria and frequency.  Musculoskeletal: Positive for back pain and myalgias. Negative for falls.  Skin: Negative for rash.  Neurological: Negative for dizziness, loss of consciousness and headaches.  Endo/Heme/Allergies: Negative for environmental allergies.  Psychiatric/Behavioral: Negative for depression. The patient is not nervous/anxious.        Objective:    Physical Exam Constitutional:      Appearance: Normal appearance. He is not ill-appearing.  HENT:     Head: Normocephalic and atraumatic.     Right Ear: External ear normal.     Left Ear: External ear normal.  Eyes:     General:        Right eye: No discharge.        Left eye: No discharge.  Pulmonary:     Effort: Pulmonary effort is normal.  Neurological:     Mental Status: He is alert and oriented to person, place, and time.  Psychiatric:        Behavior: Behavior normal.     BP 121/72 (BP Location: Left Arm, Patient Position: Sitting, Cuff Size: Normal)  Wt Readings from Last 3 Encounters:  02/26/20 165 lb (74.8 kg)  01/23/20 159 lb (72.1 kg)  01/09/20 159 lb (72.1 kg)    Diabetic Foot Exam - Simple   No data filed     Lab Results  Component Value Date   WBC 7.1 02/26/2020   HGB 14.5 02/26/2020   HCT 43.1 02/26/2020   PLT 307 02/26/2020   GLUCOSE 104 (H) 02/28/2020   CHOL 130 02/28/2020   TRIG 94.0 02/28/2020   HDL 62.40 02/28/2020   LDLDIRECT 64.0 10/30/2017   LDLCALC 49 02/28/2020   ALT 10 02/28/2020   AST 21 02/28/2020   NA 135 02/28/2020   K 4.2 02/28/2020   CL 95 (L) 02/28/2020   CREATININE 0.73 02/28/2020   BUN 19 02/28/2020   CO2 32 02/28/2020   TSH 3.42 10/08/2019   PSA 0.65 10/08/2019   INR 1.1 03/12/2019   HGBA1C 7.0 (H) 10/08/2019   MICROALBUR 0.61 04/23/2012    Lab Results  Component Value Date   TSH 3.42 10/08/2019   Lab Results   Component Value Date   WBC 7.1 02/26/2020   HGB 14.5 02/26/2020   HCT 43.1 02/26/2020   MCV 92.5 02/26/2020   PLT 307 02/26/2020   Lab Results  Component Value Date   NA 135 02/28/2020   K 4.2 02/28/2020   CO2 32 02/28/2020   GLUCOSE 104 (H) 02/28/2020   BUN 19 02/28/2020   CREATININE 0.73 02/28/2020   BILITOT 0.7 02/28/2020   ALKPHOS 73 02/28/2020   AST 21 02/28/2020   ALT 10 02/28/2020   PROT 6.7 02/28/2020   ALBUMIN 4.4 02/28/2020   CALCIUM 9.8 02/28/2020   ANIONGAP 7 01/10/2020   GFR 104.32 02/28/2020   Lab Results  Component Value Date   CHOL 130 02/28/2020   Lab Results  Component Value Date   HDL 62.40 02/28/2020   Lab Results  Component Value Date   LDLCALC 49 02/28/2020   Lab Results  Component Value Date   TRIG 94.0 02/28/2020   Lab Results  Component Value Date   CHOLHDL 2 02/28/2020  Lab Results  Component Value Date   HGBA1C 7.0 (H) 10/08/2019       Assessment & Plan:   Problem List Items Addressed This Visit    HTN (hypertension)    Monitor and report any concerns. no changes to meds. Encouraged heart healthy diet such as the DASH diet and exercise as tolerated.       Protein-calorie malnutrition, severe    His appetite has returned and he is getting hungry again. He is trying to up his protein intake and feels stronger, encouraged to continue the same      Gastric ulcer    He is feeling much better with aggressive treatment from gastroenterology. No changes today      Hyperglycemia - Primary    Check hgba1c with next blood draw, minimize simple carbs. Increase exercise as tolerated. Continue current meds      Relevant Orders   Hemoglobin A1c      I am having Mitesh E. Carbajal "Luis Brown" maintain his cetirizine, Contour Next Test, testosterone cypionate, busPIRone, multivitamin with minerals, lisinopril, ondansetron, sucralfate, venlafaxine XR, tamsulosin, atorvastatin, metFORMIN, Ferretts, doxycycline, pantoprazole, tiZANidine,  pregabalin, HYDROcodone-acetaminophen, Januvia, diazepam, and HYDROcodone-acetaminophen.  No orders of the defined types were placed in this encounter.    I discussed the assessment and treatment plan with the patient. The patient was provided an opportunity to ask questions and all were answered. The patient agreed with the plan and demonstrated an understanding of the instructions.   The patient was advised to call back or seek an in-person evaluation if the symptoms worsen or if the condition fails to improve as anticipated.  I provided 25 minutes of non-face-to-face time during this encounter.   Penni Homans, MD

## 2020-03-22 NOTE — Assessment & Plan Note (Signed)
He is feeling much better with aggressive treatment from gastroenterology. No changes today

## 2020-03-22 NOTE — Assessment & Plan Note (Signed)
His appetite has returned and he is getting hungry again. He is trying to up his protein intake and feels stronger, encouraged to continue the same

## 2020-03-22 NOTE — Assessment & Plan Note (Signed)
Monitor and report any concerns. no changes to meds. Encouraged heart healthy diet such as the DASH diet and exercise as tolerated.  

## 2020-03-24 ENCOUNTER — Ambulatory Visit (INDEPENDENT_AMBULATORY_CARE_PROVIDER_SITE_OTHER): Payer: Medicare Other

## 2020-03-24 ENCOUNTER — Other Ambulatory Visit: Payer: Self-pay

## 2020-03-24 ENCOUNTER — Encounter: Payer: Self-pay | Admitting: Specialist

## 2020-03-24 ENCOUNTER — Ambulatory Visit: Payer: Medicare Other

## 2020-03-24 DIAGNOSIS — E349 Endocrine disorder, unspecified: Secondary | ICD-10-CM | POA: Diagnosis not present

## 2020-03-24 MED ORDER — TESTOSTERONE CYPIONATE 200 MG/ML IM SOLN
200.0000 mg | Freq: Once | INTRAMUSCULAR | Status: AC
  Administered 2020-03-24: 200 mg via INTRAMUSCULAR

## 2020-03-24 NOTE — Progress Notes (Addendum)
Patient here today for testosterone injection. 1 mL given in right upper outer quadrant. Patient tolerated well. Patient scheduled for 2 weeks for next injection.   Agree with injection for low T.  Mackie Pai, PA-C

## 2020-03-26 ENCOUNTER — Other Ambulatory Visit: Payer: Self-pay

## 2020-03-26 ENCOUNTER — Ambulatory Visit: Payer: Self-pay

## 2020-03-26 ENCOUNTER — Encounter: Payer: Self-pay | Admitting: Specialist

## 2020-03-26 ENCOUNTER — Other Ambulatory Visit: Payer: Self-pay | Admitting: Medical

## 2020-03-26 ENCOUNTER — Ambulatory Visit (INDEPENDENT_AMBULATORY_CARE_PROVIDER_SITE_OTHER): Payer: Medicare Other | Admitting: Specialist

## 2020-03-26 VITALS — BP 135/75 | HR 73 | Ht 70.0 in | Wt 165.0 lb

## 2020-03-26 DIAGNOSIS — T8484XA Pain due to internal orthopedic prosthetic devices, implants and grafts, initial encounter: Secondary | ICD-10-CM | POA: Diagnosis not present

## 2020-03-26 DIAGNOSIS — M40294 Other kyphosis, thoracic region: Secondary | ICD-10-CM | POA: Diagnosis not present

## 2020-03-26 DIAGNOSIS — M4624 Osteomyelitis of vertebra, thoracic region: Secondary | ICD-10-CM | POA: Diagnosis not present

## 2020-03-26 DIAGNOSIS — R531 Weakness: Secondary | ICD-10-CM | POA: Diagnosis not present

## 2020-03-26 DIAGNOSIS — Z981 Arthrodesis status: Secondary | ICD-10-CM | POA: Diagnosis not present

## 2020-03-26 MED ORDER — HYDROCODONE-ACETAMINOPHEN 5-325 MG PO TABS: 1.0000 | ORAL_TABLET | Freq: Four times a day (QID) | ORAL | 0 refills | Status: DC | PRN

## 2020-03-26 NOTE — Patient Instructions (Addendum)
  Plan: Avoid frequent bending and stooping  No lifting greater than 10 lbs. May use ice or moist heat for pain. Weight loss is of benefit. Best medication for lumbar disc disease is arthritis medications like motrin, celebrex and naprosyn. Exercise is important to improve your indurance and does allow people to function better inspite of back pain. Enquire with [employer] about silver sneakers and the use of the swimming pool for exercise. Pain management referral as he is still needing use of narcotics to relieve pain. Obtain follow up MRI to assess for cord changes and the site of previous infection.

## 2020-03-26 NOTE — Telephone Encounter (Signed)
Discussed with patient, am concerned about length of time on narcotics. It is not a treatment for his condition, does not alter the long term out Come of the back surgery or the infection. 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 Decrease pain tolerance due to use of narcotics chronically is also a consequence. If further surgery is not scheduled we should be trying to reduce the use of this medication.

## 2020-03-26 NOTE — Progress Notes (Signed)
Office Visit Note   Patient: Luis Brown           Date of Birth: Oct 30, 1945           MRN: [id number] Visit Date: 03/26/2020              Requested by: Mackie Pai, PA-C [place],  [place] [id number] PCP: Mackie Pai, PA-C   Assessment & Plan: Visit Diagnoses:  1. Status post thoracic spinal fusion   2. 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HCC)   3. Other kyphosis, thoracic region   4. Weakness   5. Painful orthopaedic hardware [facility])     Plan: Avoid frequent bending and stooping  No lifting greater than 10 lbs. May use ice or moist heat for pain. Weight loss is of benefit. Best medication for lumbar disc disease is arthritis medications like motrin, celebrex and naprosyn. Exercise is important to improve your indurance and does allow people to function better inspite of back pain.    Follow-Up Instructions: No follow-ups on file.   Orders:  Orders Placed This Encounter  Procedures  . XR Thoracic Spine 2 View   No orders of the defined types were placed in this encounter.     Procedures: No procedures performed   Clinical Data: No additional findings.   Subjective: Chief Complaint  Patient presents with  . Spine - Follow-up    74 year old male with history of t10 to S1 fusion developed loosening of hardware at the upper T10 level gradual and at first not painful infact had rearthrodesis with removal of iliac wing screws bilaterally at L5-S1 and this has healed. Then the upper segment worsened T10 had extension of the fusion upwards to the T5 level to bridge the area of T9-10and with a failure of connectors a culture of the T10 disc space showed sign of infection.    Review of Systems  Constitutional: Negative.   HENT: Negative.   Eyes: Negative.   Respiratory: Negative.   Cardiovascular: Negative.   Gastrointestinal: Negative.   Endocrine: Negative.   Genitourinary: Negative.   Musculoskeletal: Negative.     Skin: Negative.   Allergic/Immunologic: Negative.   Neurological: Negative.   Hematological: Negative.   Psychiatric/Behavioral: Negative.      Objective: Vital Signs: BP 135/75 (BP Location: Left Arm, Patient Position: Sitting)   Pulse 73   Ht 5\' 10"  (1.778 m)   Wt 165 lb (74.8 kg)   BMI 23.68 kg/m   Physical Exam Constitutional:      Appearance: He is well-developed.  HENT:     Head: Normocephalic and atraumatic.  Eyes:     Pupils: Pupils are equal, round, and reactive to light.  Pulmonary:     Effort: Pulmonary effort is normal.     Breath sounds: Normal breath sounds.  Abdominal:     General: Bowel sounds are normal.     Palpations: Abdomen is soft.  Musculoskeletal:     Cervical back: Normal range of motion and neck supple.     Lumbar back: Negative right straight leg raise test and negative left straight leg raise test.  Skin:    General: Skin is warm and dry.  Neurological:     Mental Status: He is alert and oriented to person, place, and time.  Psychiatric:        Behavior: Behavior normal.        Thought Content: Thought content normal.  Judgment: Judgment normal.     Back Exam   Tenderness  The patient is experiencing tenderness in the lumbar.  Range of Motion  Extension: abnormal  Flexion: abnormal  Lateral bend right: normal  Lateral bend left: normal  Rotation right: normal  Rotation left: normal   Muscle Strength  Right Quadriceps:  5/5  Right Hamstrings:  5/5  Left Hamstrings:  5/5   Tests  Straight leg raise right: negative Straight leg raise left: negative  Reflexes  Patellar: 2/4 Achilles: 2/4 Babinski's sign: abnormal   Other  Toe walk: normal Heel walk: normal Sensation: decreased Gait: abnormal  Erythema: no back redness Scars: absent  Comments:  Unable to sense position of great toe with eyes closed.        Specialty Comments:  No specialty comments available.  Imaging: No results found.   PMFS  History: Patient Active Problem List   Diagnosis Date Noted  . Hyperglycemia 03/22/2020  . Gastric ulcer 01/27/2020  . H/O colonoscopy with polypectomy 01/27/2020  . Protein-calorie malnutrition, severe 01/09/2020  . Weight loss 01/07/2020  . Abdominal pain 12/30/2019  . Nausea and vomiting 12/30/2019  . Weight loss, abnormal 12/30/2019  . Change in bowel habits 12/30/2019  . 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 Medication monitoring encounter 08/26/2019  . Peripheral neuropathy 08/07/2019  . Alcohol abuse 05/09/2019  . Hyponatremia 05/05/2019  . 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[place]) 04/04/2019  . Fusion of spine of thoracolumbar region 03/12/2019  . Status post thoracic spinal fusion 02/12/2019  . 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 Thoracic spinal stenosis 04/17/2017  . Painful orthopaedic hardware ([place]) 04/17/2017  . 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 ([place]) 04/17/2017  . Low back pain radiating to both legs 01/16/2017  . Esophageal reflux 02/10/2015  . Medicare annual wellness visit, subsequent 02/10/2015  . Personal history of colonic polyps 10/27/2012  . 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  . BPH (benign prostatic hyperplasia) 04/23/2012  . ED (erectile dysfunction) 04/23/2012  . Allergic state 10/25/2011  . Anxiety and depression 10/25/2011  . Asthma   . Diabetes mellitus without complication ([place])   . Hyperlipidemia   . Overweight   . Insomnia   . Fatigue   . Preventative health care   . DDD (degenerative disc disease), lumbosacral   . Thoracic degenerative disc disease   . HTN (hypertension)    Past Medical History:  Diagnosis Date  . Acute pharyngitis 10/21/2013  . Allergy    grass and pollen  . Anxiety and depression 10/25/2011  . BPH (benign prostatic hyperplasia) 04/23/2012  . Chicken pox as a child  . DDD (degenerative disc disease)    cervical responds to  steroid injections and low back required surgery  . DDD (degenerative disc disease), lumbosacral   . Diabetes mellitus    pre  . Dyspnea   . ED (erectile dysfunction) 04/23/2012  . Elevated BP   . 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 Esophageal reflux 02/10/2015  . Fatigue   . HTN (hypertension)   . Hyperglycemia    preDM   . Hyperlipidemia   . Insomnia   . Low back pain radiating to both legs 01/16/2017  . Measles as a child  . Medicare annual wellness visit, subsequent 02/10/2015  . Overweight(278.02)   . Peripheral neuropathy 08/07/2019  . Personal history of colonic polyps 10/27/2012   Follows with [facility]  . Pneumonia    " walking"  .  Preventative health care   . 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  . Wears glasses     Family History  Problem Relation Age of Onset  . Hypertension Mother   . Diabetes Mother        type 2  . Cancer Mother 21       breast in remission  . Emphysema Father        smoker  . COPD Father        smoker  . Stroke Father 39       mini  . Heart disease Father   . Hypertension Sister   . Hyperlipidemia Sister   . Scoliosis Sister   . Osteoporosis Sister   . Hypertension Maternal Grandmother   . Scoliosis Maternal Grandmother   . Heart disease Maternal Grandfather   . Heart disease Paternal Grandfather        smoker  . Heart disease Daughter     Past Surgical History:  Procedure Laterality Date  . BACK SURGERY  2012 and 1994   Dr Margret Chance, screws and cage in low back  . BIOPSY  01/09/2020   Procedure: BIOPSY;  Surgeon: Otis Brace, MD;  Location: [facility];  Service: Gastroenterology;;  . COLONOSCOPY WITH PROPOFOL N/A 01/09/2020   Procedure: COLONOSCOPY WITH PROPOFOL;  Surgeon: Otis Brace, MD;  Location: [facility];  Service: Gastroenterology;  Laterality: N/A;  . ESOPHAGOGASTRODUODENOSCOPY (EGD) WITH PROPOFOL N/A 01/09/2020   Procedure: ESOPHAGOGASTRODUODENOSCOPY (EGD) WITH PROPOFOL;  Surgeon: Otis Brace, MD;   Location: [facility];  Service: Gastroenterology;  Laterality: N/A;  . EYE SURGERY Bilateral    2016  . HARDWARE REVISION  03/12/2019   REVISION OF HARDWARE THORACIC TEN-THORACIC ELEVEN WITH APPLICATION OF ADDITIONAL RODS AND ROD SLEEVES THORACIC EIGHT-THORACIC TWELVE 12-LUMBAR ONE (N/A )  . LAMINECTOMY  02/12/2019   DECOMPRESSIVE LAMINECTOMY THORACIC NINE-THORACIC TEN AND THORACIC TEN-THORACIC ELEVEN, EXTENSION OF THORACOLUMBAR FUSION FROM THORACIC TEN TO THORACIC FIVE, LOCAL BONE GRAFT, ALLOGRAFT AND VIVIGEN (N/A)  . POLYPECTOMY  01/09/2020   Procedure: POLYPECTOMY;  Surgeon: Otis Brace, MD;  Location: [facility];  Service: Gastroenterology;;  . SPINAL FUSION N/A 02/12/2019   Procedure: DECOMPRESSIVE LAMINECTOMY THORACIC NINE-THORACIC TEN AND THORACIC TEN-THORACIC ELEVEN, EXTENSION OF THORACOLUMBAR FUSION FROM THORACIC TEN TO THORACIC FIVE, LOCAL BONE GRAFT, ALLOGRAFT AND VIVIGEN;  Surgeon: Jessy Oto, MD;  Location: [place];  Service: Orthopedics;  Laterality: N/A;  DECOMPRESSIVE LAMINECTOMY THORACIC NINE-THORACIC TEN AND THORACIC TEN-THORACIC ELEVEN, EXTENSION OF TH  . SPINAL FUSION N/A 03/12/2019   Procedure: REVISION OF HARDWARE THORACIC TEN-THORACIC ELEVEN WITH APPLICATION OF ADDITIONAL RODS AND ROD SLEEVES THORACIC EIGHT-THORACIC TWELVE 12-LUMBAR ONE;  Surgeon: Jessy Oto, MD;  Location: [place];  Service: Orthopedics;  Laterality: N/A;  . TONSILLECTOMY    . TONSILLECTOMY     as child  . torn rotator cuff  2010   right   Social History   Occupational History  . Not on file  Tobacco Use  . Smoking status: Former Smoker    Packs/day: 1.00    Years: 20.00    Pack years: 20.00    Types: Cigarettes    Quit date: 07/18/1989    Years since quitting: 30.7  . Smokeless tobacco: Never Used  . Tobacco comment: off and on smoking for the 20 yrs  Vaping Use  . Vaping Use: Never used  Substance and Sexual Activity  . Alcohol use: Yes    Comment: drink or two a day- mixed  drink  . Drug use: No  .  Sexual activity: Yes    Comment: lives with wife, still working, no dietary restrictions, continues to exercise intermittently

## 2020-03-26 NOTE — Progress Notes (Signed)
09

## 2020-03-27 ENCOUNTER — Other Ambulatory Visit (INDEPENDENT_AMBULATORY_CARE_PROVIDER_SITE_OTHER): Payer: Medicare Other

## 2020-03-27 ENCOUNTER — Ambulatory Visit: Payer: Medicare Other

## 2020-03-27 ENCOUNTER — Other Ambulatory Visit: Payer: Medicare Other

## 2020-03-27 DIAGNOSIS — R739 Hyperglycemia, unspecified: Secondary | ICD-10-CM

## 2020-03-27 DIAGNOSIS — E349 Endocrine disorder, unspecified: Secondary | ICD-10-CM

## 2020-03-28 ENCOUNTER — Encounter: Payer: Self-pay | Admitting: Specialist

## 2020-03-28 ENCOUNTER — Encounter: Payer: Self-pay | Admitting: Medical

## 2020-03-28 LAB — HEMOGLOBIN A1C
Hgb A1c MFr Bld: 6.4 % of total Hgb — ABNORMAL HIGH (ref <5.7)
Mean Plasma Glucose: 137 (calc)
eAG (mmol/L): 7.6 (calc)

## 2020-03-30 ENCOUNTER — Ambulatory Visit: Payer: Medicare Other

## 2020-03-31 LAB — TESTOSTERONE, FREE, TOTAL, SHBG
Sex Hormone Binding: 41.2 nmol/L (ref 19.3–76.4)
Testosterone, Free: 19.8 pg/mL — ABNORMAL HIGH (ref 6.6–18.1)
Testosterone: 1178 ng/dL — ABNORMAL HIGH (ref 264–916)

## 2020-04-02 ENCOUNTER — Ambulatory Visit: Payer: Medicare Other | Admitting: Physical Therapy

## 2020-04-02 ENCOUNTER — Other Ambulatory Visit: Payer: Self-pay | Admitting: Specialist

## 2020-04-02 ENCOUNTER — Encounter: Payer: Self-pay | Admitting: Specialist

## 2020-04-02 ENCOUNTER — Other Ambulatory Visit: Payer: Self-pay

## 2020-04-02 DIAGNOSIS — R262 Difficulty in walking, not elsewhere classified: Secondary | ICD-10-CM

## 2020-04-02 DIAGNOSIS — M5415 Radiculopathy, thoracolumbar region: Secondary | ICD-10-CM | POA: Diagnosis not present

## 2020-04-02 DIAGNOSIS — R2681 Unsteadiness on feet: Secondary | ICD-10-CM | POA: Diagnosis not present

## 2020-04-02 DIAGNOSIS — M6281 Muscle weakness (generalized): Secondary | ICD-10-CM

## 2020-04-02 DIAGNOSIS — M546 Pain in thoracic spine: Secondary | ICD-10-CM | POA: Diagnosis not present

## 2020-04-02 DIAGNOSIS — M545 Low back pain: Secondary | ICD-10-CM | POA: Diagnosis not present

## 2020-04-02 DIAGNOSIS — G8929 Other chronic pain: Secondary | ICD-10-CM | POA: Diagnosis not present

## 2020-04-02 NOTE — Therapy (Signed)
[facility] [address] [place], [place], [address] Phone: [phone]   Fax:  [phone]  Physical Therapy Treatment/Re-Cert  Patient Details  Name: Luis Brown MRN: [id number] Date of Birth: Jan 14, 1946 Referring Provider (PT): Dr. Penni Homans    Encounter Date: 04/02/2020   PT End of Session - 04/02/20 1907    Visit Number 6    Number of Visits 14    Date for PT Re-Evaluation 06/01/20   written for 30 day POC   Authorization Type [facility] - Visit Number 6    Authorization - Number of Visits 10    Progress Note Due on Visit 10    PT Start Time 1015    PT Stop Time 1100    PT Time Calculation (min) 45 min    Equipment Utilized During Treatment Gait belt    Activity Tolerance Patient tolerated treatment well    Behavior During Therapy [facility] for tasks assessed/performed   pt needing to be redirected to the task at hand at times          Past Medical History:  Diagnosis Date  . Acute pharyngitis 10/21/2013  . Allergy    grass and pollen  . Anxiety and depression 10/25/2011  . BPH (benign prostatic hyperplasia) 04/23/2012  . Chicken pox as a child  . DDD (degenerative disc disease)    cervical responds to steroid injections and low back required surgery  . DDD (degenerative disc disease), lumbosacral   . Diabetes mellitus    pre  . Dyspnea   . ED (erectile dysfunction) 04/23/2012  . Elevated BP   . 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 Esophageal reflux 02/10/2015  . Fatigue   . HTN (hypertension)   . Hyperglycemia    preDM   . Hyperlipidemia   . Insomnia   . Low back pain radiating to both legs 01/16/2017  . Measles as a child  . Medicare annual wellness visit, subsequent 02/10/2015  . Overweight(278.02)   . Peripheral neuropathy 08/07/2019  . Personal history of colonic polyps 10/27/2012   Follows with [facility]  . Pneumonia    " walking"  . Preventative health care   .  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  . Wears glasses     Past Surgical History:  Procedure Laterality Date  . BACK SURGERY  2012 and 1994   Dr Margret Chance, screws and cage in low back  . BIOPSY  01/09/2020   Procedure: BIOPSY;  Surgeon: Otis Brace, MD;  Location: [facility];  Service: Gastroenterology;;  . COLONOSCOPY WITH PROPOFOL N/A 01/09/2020   Procedure: COLONOSCOPY WITH PROPOFOL;  Surgeon: Otis Brace, MD;  Location: [facility];  Service: Gastroenterology;  Laterality: N/A;  . ESOPHAGOGASTRODUODENOSCOPY (EGD) WITH PROPOFOL N/A 01/09/2020   Procedure: ESOPHAGOGASTRODUODENOSCOPY (EGD) WITH PROPOFOL;  Surgeon: Otis Brace, MD;  Location: [facility];  Service: Gastroenterology;  Laterality: N/A;  . EYE SURGERY Bilateral    2016  . HARDWARE REVISION  03/12/2019   REVISION OF HARDWARE THORACIC TEN-THORACIC ELEVEN WITH APPLICATION OF ADDITIONAL RODS AND ROD SLEEVES THORACIC EIGHT-THORACIC TWELVE 12-LUMBAR ONE (N/A )  . LAMINECTOMY  02/12/2019   DECOMPRESSIVE LAMINECTOMY THORACIC NINE-THORACIC TEN AND THORACIC TEN-THORACIC ELEVEN, EXTENSION OF THORACOLUMBAR FUSION FROM THORACIC TEN TO THORACIC FIVE, LOCAL BONE GRAFT, ALLOGRAFT AND VIVIGEN (N/A)  . POLYPECTOMY  01/09/2020   Procedure: POLYPECTOMY;  Surgeon: Otis Brace, MD;  Location: [facility];  Service: Gastroenterology;;  . SPINAL FUSION N/A 02/12/2019   Procedure: DECOMPRESSIVE  LAMINECTOMY THORACIC NINE-THORACIC TEN AND THORACIC TEN-THORACIC ELEVEN, EXTENSION OF THORACOLUMBAR FUSION FROM THORACIC TEN TO THORACIC FIVE, LOCAL BONE GRAFT, ALLOGRAFT AND VIVIGEN;  Surgeon: Jessy Oto, MD;  Location: [facility];  Service: Orthopedics;  Laterality: N/A;  DECOMPRESSIVE LAMINECTOMY THORACIC NINE-THORACIC TEN AND THORACIC TEN-THORACIC ELEVEN, EXTENSION OF TH  . SPINAL FUSION N/A 03/12/2019   Procedure: REVISION OF HARDWARE THORACIC TEN-THORACIC ELEVEN WITH APPLICATION OF ADDITIONAL RODS AND ROD SLEEVES THORACIC EIGHT-THORACIC  TWELVE 12-LUMBAR ONE;  Surgeon: Jessy Oto, MD;  Location: [facility];  Service: Orthopedics;  Laterality: N/A;  . TONSILLECTOMY    . TONSILLECTOMY     as child  . torn rotator cuff  2010   right    There were no vitals filed for this visit.   Subjective Assessment - 04/02/20 1018    Subjective Has been doing more walking at home. The exercises are going well at home for stretching. Has been gaining weight too.    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 Decreased conspicuity of ventralepidural abscess, epidural phlegmon and spinal canal narrowing atthe T4-5 level. T4-5 cord edema, unchanged. 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 Partially imaged posterior spinal fusion hardware. Please note lackof intravenous contrast and susceptibility artifact limitsevaluation. These results will be called to the ordering clinician orrepresentative by the Radiologist Assistant, and communicationdocumented in the PACS or [employer]. AP and lateral thoracic spine demonstrates obliteration of the T4-5 disc space, there is more separation of the T 5 pedicle screws within the vertebral body of T5 suggesting increased loosening of the screws at this level. IMPRESSION:1. Fracture and loosening of both T5 pedicle screws which traversethe T5 superior endplate. 2. Loosening of the right T9 pedicle screw which traverses the rightcostovertebral junction. Question subtle loosening also of the otherintervening right thoracic pedicle screws T6 through T8. 3. Sequelae of discitis osteomyelitis suspected at T9-T10 withdeveloping interbody ankylosis. Severe bilateral T9 osseous neuralforaminal stenosis. Moderate to severe bilateral T10 osseousforaminal stenosis. 4. T11 through visible upper lumbar pedicle screws are intactwithout loosening. There is developing right side posterior elementankylosis at T11-T12, and developing  bilateral posterior elementankylosis at T12-L1.    Patient Stated Goals "get stronger", some weight gain/muscle gain, wants to continue to work on walking              [facility] PT Assessment - 04/02/20 1023      Assessment   Medical Diagnosis Weakness    Referring Provider (PT) Dr. Penni Homans     Onset Date/Surgical Date 02/05/20      Prior Function   Level of Independence Independent      Observation/Other Assessments   Observations per pt report the R leg is shorter than the L leg      6 Minute Walk- Baseline   6 Minute Walk- Baseline yes    BP (mmHg) 135/69    HR (bpm) 67    02 Sat (%RA) 100 %    Modified Borg Scale for Dyspnea 0- Nothing at all      6 Minute walk- Post Test   6 Minute Walk Post Test yes    BP (mmHg) 173/82    HR (bpm) 101    02 Sat (%RA) 99 %    Modified Borg Scale for Dyspnea 5- Strong or hard breathing      6 minute walk test results    Aerobic Endurance Distance Walked 988    Endurance  additional comments pt noted to have L trendelenberg after approx. 3 minutes of ambulation      Functional Gait  Assessment   Gait assessed  Yes    Gait Level Surface Walks 20 ft, slow speed, abnormal gait pattern, evidence for imbalance or deviates 10-15 in outside of the 12 in walkway width. Requires more than 7 sec to ambulate 20 ft.    Change in Gait Speed Able to change speed, demonstrates mild gait deviations, deviates 6-10 in outside of the 12 in walkway width, or no gait deviations, unable to achieve a major change in velocity, or uses a change in velocity, or uses an assistive device.    Gait with Horizontal Head Turns Performs head turns smoothly with slight change in gait velocity (eg, minor disruption to smooth gait path), deviates 6-10 in outside 12 in walkway width, or uses an assistive device.    Gait with Vertical Head Turns Performs task with slight change in gait velocity (eg, minor disruption to smooth gait path), deviates 6 - 10 in outside 12 in  walkway width or uses assistive device    Gait and Pivot Turn Pivot turns safely in greater than 3 sec and stops with no loss of balance, or pivot turns safely within 3 sec and stops with mild imbalance, requires small steps to catch balance.    Step Over Obstacle Is able to step over one shoe box (4.5 in total height) without changing gait speed. No evidence of imbalance.    Gait with Narrow Base of Support Ambulates less than 4 steps heel to toe or cannot perform without assistance.    Gait with Eyes Closed Cannot walk 20 ft without assistance, severe gait deviations or imbalance, deviates greater than 15 in outside 12 in walkway width or will not attempt task.    Ambulating Backwards Walks 20 ft, slow speed, abnormal gait pattern, evidence for imbalance, deviates 10-15 in outside 12 in walkway width.    Steps Two feet to a stair, must use rail.    Total Score 13    FGA comment: 13/30                         [facility] Adult PT Treatment/Exercise - 04/02/20 1023      Transfers   Transfers Sit to Stand;Stand to Sit    Sit to Stand 5: Supervision    Stand to Sit 5: Supervision    Comments 30 second chair stand: 11 sit <> stands with no UE support, favoring LLE towards end of reps, rating 6/10 exertion      Ambulation/Gait   Ambulation/Gait Yes    Ambulation/Gait Assistance 5: Supervision    Ambulation Distance (Feet) 988 Feet   during 6MWT   Assistive device None    Gait Pattern Step-through pattern;Wide base of support   decr B push off     Therapeutic Activites    Therapeutic Activities Other Therapeutic Activities    Other Therapeutic Activities Pt with a significant incr in systolic BP after 6MWT - asked pt if he is taking medication for BP, pt states that he was on it before and then he stopped taking it (without telling his MD or without being instructed to). Discussed importance of taking medication as instructed and not stopping medication without being instructed to.  Told pt to discuss with PCP next week when pt has a virtual visit  PT Education - 04/02/20 1906    Education Details see TA, clinical findings from outcome measures, continuing POC for 2x week for 4 weeks    Person(s) Educated Patient    Methods Explanation    Comprehension Verbalized understanding            PT Short Term Goals - 02/14/20 1207      PT SHORT TERM GOAL #1   Title Patient will demo 10 deg of Passive internal rotation of bil hips to reduce excessive external rotation during gait    Baseline Internal rotation PROM: 0 deg R; -5 deg L    Time 4    Period Weeks    Status New    Target Date 03/13/20      PT SHORT TERM GOAL #2   Title Patient will be able to perform heel raises 1 inch off the floor for 5 consecutive reps to improve calf strength to assist with push off    Baseline <1/2 inch- pt has excessive compensation and leans forward    Time 4    Period Weeks    Status New    Target Date 03/13/20             PT Long Term Goals - 04/02/20 1022      PT LONG TERM GOAL #1   Title Patient will be independent with ongoing/advanced HEP    Baseline pt reports performing his exercises intermittently at home    Time 8    Period Weeks    Status Partially Met      PT LONG TERM GOAL #2   Title Patient will be able to walk for 20-30 min without rest to improve walking endurance    Baseline pt reports that this is probably his limit    Time 8    Period Weeks    Status Achieved           Revised LTGs for re-cert:          Plan - 04/02/20 1909    Clinical Impression Statement Focus of today's skilled session was assessing outcome measures for re-cert. Pt has missed some visits in POC due to being out of town and scheduling conflicts. Pt has met LTG #2 and partially met LTG #1 in regards to HEP. Performed 30 second chair stand with pt performing 11 sit <> stands with no UE support, which is in age related norms, however pt reporting  6/10 fatigue afterwards. Performed 6MWT with pt able to ambulate 80' - noted pt to have L trendelenburg and hip weakness after approx. 3 minutes. Performed FGA with pt scoring a 13/30 indicating that pt is at a high risk of falls. Pt will continue to benefit from skilled PT to improve BLE strength, balance, gait, and endurance in order to improve functional mobility and decr fall risk. Will continue POC for an additional 2x week for 4 weeks.    Personal Factors and Comorbidities Age;Time since onset of injury/illness/exacerbation;Past/Current Experience;Comorbidity 2    Examination-Activity Limitations Carry    Examination-Participation Restrictions Cleaning;Yard Work    [employer] Evolving/Moderate complexity    Rehab Potential Good    PT Frequency 2x / week    PT Duration 4 weeks    PT Treatment/Interventions ADLs/Self Care Home Management;Gait training;Stair training;Functional mobility training;Therapeutic activities;Neuromuscular re-education;Balance training;Therapeutic exercise;Patient/family education;Orthotic Fit/Training;Vestibular;Energy conservation    PT Next Visit Plan monitor BP with activity - is pt compliant with meds? review previous HEP for stretching and add  strengthening (hip)/balance as appropriate. fuctional strength/balance.    PT Home Exercise Plan Access Code: [email or web address]: [email or web address]: 07/30/2021Prepared by: Gwenyth Bouillon PatelExercisesHooklying Single Knee to Chest Stretch - 1 x daily - 7 x weekly - 3 reps - 20 holdSupine Piriformis Stretch with Foot on Ground - 1 x daily - 7 x weekly - 3 reps - 20 holdSupine ITB Stretch - 1 x daily - 7 x weekly - 3 reps - 20 holdSupine Hamstring Stretch - 1 x daily - 7 x weekly - 3 reps - 20 holdStanding Soleus Stretch on Step - 1 x daily - 7 x weekly - 3 reps - 20 hold           Patient will benefit from skilled therapeutic intervention in order to improve the following deficits and  impairments:  Abnormal gait, Decreased balance, Decreased endurance, Decreased range of motion, Decreased strength, Impaired sensation, Pain, Postural dysfunction, Decreased activity tolerance, Difficulty walking  Visit Diagnosis: Muscle weakness (generalized)  Unsteadiness on feet  Difficulty in walking, not elsewhere classified     Problem List Patient Active Problem List   Diagnosis Date Noted  . Hyperglycemia 03/22/2020  . Gastric ulcer 01/27/2020  . H/O colonoscopy with polypectomy 01/27/2020  . Protein-calorie malnutrition, severe 01/09/2020  . Weight loss 01/07/2020  . Abdominal pain 12/30/2019  . Nausea and vomiting 12/30/2019  . Weight loss, abnormal 12/30/2019  . Change in bowel habits 12/30/2019  . 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 Medication monitoring encounter 08/26/2019  . Peripheral neuropathy 08/07/2019  . Alcohol abuse 05/09/2019  . Hyponatremia 05/05/2019  . 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[place]) 04/04/2019  . Fusion of spine of thoracolumbar region 03/12/2019  . Status post thoracic spinal fusion 02/12/2019  . 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 Thoracic spinal stenosis 04/17/2017  . Painful orthopaedic hardware ([place]) 04/17/2017  . 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 ([place]) 04/17/2017  . Low back pain radiating to both legs 01/16/2017  . Esophageal reflux 02/10/2015  . Medicare annual wellness visit, subsequent 02/10/2015  . Personal history of colonic polyps 10/27/2012  . 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  . BPH (benign prostatic hyperplasia) 04/23/2012  . ED (erectile dysfunction) 04/23/2012  . Allergic state 10/25/2011  . Anxiety and depression 10/25/2011  . Asthma   . Diabetes mellitus without complication ([place])   . Hyperlipidemia   . Overweight   . Insomnia   . Fatigue   . Preventative health care   . DDD (degenerative disc disease),  lumbosacral   . Thoracic degenerative disc disease   . HTN (hypertension)     Arliss Journey, PT, DPT  04/02/2020, 8:16 PM  [facility] [address] [place], [place], [address] Phone: [phone]   Fax:  [phone]  Name: Luis Brown MRN: [id number] Date of Birth: [birth year]

## 2020-04-03 MED ORDER — HYDROCODONE-ACETAMINOPHEN 5-325 MG PO TABS: 1.0000 | ORAL_TABLET | Freq: Four times a day (QID) | ORAL | 0 refills | Status: DC | PRN

## 2020-04-06 DIAGNOSIS — M961 Postlaminectomy syndrome, not elsewhere classified: Secondary | ICD-10-CM | POA: Diagnosis not present

## 2020-04-06 DIAGNOSIS — M150 Primary generalized (osteo)arthritis: Secondary | ICD-10-CM | POA: Diagnosis not present

## 2020-04-06 DIAGNOSIS — Z79891 Long term (current) use of opiate analgesic: Secondary | ICD-10-CM | POA: Diagnosis not present

## 2020-04-06 DIAGNOSIS — G894 Chronic pain syndrome: Secondary | ICD-10-CM | POA: Diagnosis not present

## 2020-04-06 DIAGNOSIS — M4628 Osteomyelitis of vertebra, sacral and sacrococcygeal region: Secondary | ICD-10-CM | POA: Diagnosis not present

## 2020-04-07 ENCOUNTER — Ambulatory Visit: Payer: Medicare Other

## 2020-04-09 ENCOUNTER — Ambulatory Visit (INDEPENDENT_AMBULATORY_CARE_PROVIDER_SITE_OTHER): Payer: Medicare Other

## 2020-04-09 ENCOUNTER — Other Ambulatory Visit: Payer: Self-pay

## 2020-04-09 DIAGNOSIS — E291 Testicular hypofunction: Secondary | ICD-10-CM | POA: Diagnosis not present

## 2020-04-09 NOTE — Progress Notes (Signed)
Patient here today for testosterone injection. 1 mL given in right upper outer quadrant. Patient tolerated well. Patient scheduled for 2 weeks for next injection.   Agree with injection for low T. Mackie Pai, PA-C

## 2020-04-14 ENCOUNTER — Encounter: Payer: Self-pay | Admitting: Physical Therapy

## 2020-04-14 ENCOUNTER — Encounter: Payer: Self-pay | Admitting: Family Medicine

## 2020-04-14 ENCOUNTER — Ambulatory Visit: Payer: Medicare Other | Admitting: Physical Therapy

## 2020-04-14 ENCOUNTER — Other Ambulatory Visit: Payer: Self-pay

## 2020-04-14 DIAGNOSIS — M546 Pain in thoracic spine: Secondary | ICD-10-CM | POA: Diagnosis not present

## 2020-04-14 DIAGNOSIS — R262 Difficulty in walking, not elsewhere classified: Secondary | ICD-10-CM | POA: Diagnosis not present

## 2020-04-14 DIAGNOSIS — M6281 Muscle weakness (generalized): Secondary | ICD-10-CM | POA: Diagnosis not present

## 2020-04-14 DIAGNOSIS — G8929 Other chronic pain: Secondary | ICD-10-CM | POA: Diagnosis not present

## 2020-04-14 DIAGNOSIS — M5415 Radiculopathy, thoracolumbar region: Secondary | ICD-10-CM | POA: Diagnosis not present

## 2020-04-14 DIAGNOSIS — M545 Low back pain: Secondary | ICD-10-CM | POA: Diagnosis not present

## 2020-04-14 DIAGNOSIS — R2681 Unsteadiness on feet: Secondary | ICD-10-CM

## 2020-04-14 NOTE — Therapy (Signed)
[facility] [address] [place], [place], [address] Phone: [phone]   Fax:  [phone]  Physical Therapy Treatment  Patient Details  Name: ARNO CULLERS MRN: [id number] Date of Birth: [birth year] Referring Provider (PT): Dr. Penni Homans    Encounter Date: 04/14/2020   PT End of Session - 04/14/20 0810    Visit Number 7    Number of Visits 14    Date for PT Re-Evaluation 06/01/20   written for 30 day POC   Authorization Type [employer] [facility]- needs 10th visit progress note    Progress Note Due on Visit 10    PT Start Time 0805    PT Stop Time 0845    PT Time Calculation (min) 40 min    Equipment Utilized During Treatment Gait belt    Activity Tolerance Patient tolerated treatment well    Behavior During Therapy [facility] for tasks assessed/performed   pt needing to be redirected to the task at hand at times          Past Medical History:  Diagnosis Date  . Acute pharyngitis 10/21/2013  . Allergy    grass and pollen  . Anxiety and depression 10/25/2011  . BPH (benign prostatic hyperplasia) 04/23/2012  . Chicken pox as a child  . DDD (degenerative disc disease)    cervical responds to steroid injections and low back required surgery  . DDD (degenerative disc disease), lumbosacral   . Diabetes mellitus    pre  . Dyspnea   . ED (erectile dysfunction) 04/23/2012  . Elevated BP   . 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 Esophageal reflux 02/10/2015  . Fatigue   . HTN (hypertension)   . Hyperglycemia    preDM   . Hyperlipidemia   . Insomnia   . Low back pain radiating to both legs 01/16/2017  . Measles as a child  . Medicare annual wellness visit, subsequent 02/10/2015  . Overweight(278.02)   . Peripheral neuropathy 08/07/2019  . Personal history of colonic polyps 10/27/2012   Follows with [facility]  . Pneumonia    " walking"  . Preventative health care   . 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  . Wears glasses      Past Surgical History:  Procedure Laterality Date  . BACK SURGERY  2012 and 1994   Dr Margret Chance, screws and cage in low back  . BIOPSY  01/09/2020   Procedure: BIOPSY;  Surgeon: Otis Brace, MD;  Location: [facility];  Service: Gastroenterology;;  . COLONOSCOPY WITH PROPOFOL N/A 01/09/2020   Procedure: COLONOSCOPY WITH PROPOFOL;  Surgeon: Otis Brace, MD;  Location: [facility];  Service: Gastroenterology;  Laterality: N/A;  . ESOPHAGOGASTRODUODENOSCOPY (EGD) WITH PROPOFOL N/A 01/09/2020   Procedure: ESOPHAGOGASTRODUODENOSCOPY (EGD) WITH PROPOFOL;  Surgeon: Otis Brace, MD;  Location: [facility];  Service: Gastroenterology;  Laterality: N/A;  . EYE SURGERY Bilateral    2016  . HARDWARE REVISION  03/12/2019   REVISION OF HARDWARE THORACIC TEN-THORACIC ELEVEN WITH APPLICATION OF ADDITIONAL RODS AND ROD SLEEVES THORACIC EIGHT-THORACIC TWELVE 12-LUMBAR ONE (N/A )  . LAMINECTOMY  02/12/2019   DECOMPRESSIVE LAMINECTOMY THORACIC NINE-THORACIC TEN AND THORACIC TEN-THORACIC ELEVEN, EXTENSION OF THORACOLUMBAR FUSION FROM THORACIC TEN TO THORACIC FIVE, LOCAL BONE GRAFT, ALLOGRAFT AND VIVIGEN (N/A)  . POLYPECTOMY  01/09/2020   Procedure: POLYPECTOMY;  Surgeon: Otis Brace, MD;  Location: [facility];  Service: Gastroenterology;;  . SPINAL FUSION N/A 02/12/2019   Procedure: DECOMPRESSIVE LAMINECTOMY THORACIC NINE-THORACIC TEN AND THORACIC TEN-THORACIC ELEVEN, EXTENSION OF THORACOLUMBAR FUSION  FROM THORACIC TEN TO THORACIC FIVE, LOCAL BONE GRAFT, ALLOGRAFT AND VIVIGEN;  Surgeon: Jessy Oto, MD;  Location: [place];  Service: Orthopedics;  Laterality: N/A;  DECOMPRESSIVE LAMINECTOMY THORACIC NINE-THORACIC TEN AND THORACIC TEN-THORACIC ELEVEN, EXTENSION OF TH  . SPINAL FUSION N/A 03/12/2019   Procedure: REVISION OF HARDWARE THORACIC TEN-THORACIC ELEVEN WITH APPLICATION OF ADDITIONAL RODS AND ROD SLEEVES THORACIC EIGHT-THORACIC TWELVE 12-LUMBAR ONE;  Surgeon: Jessy Oto, MD;   Location: [place];  Service: Orthopedics;  Laterality: N/A;  . TONSILLECTOMY    . TONSILLECTOMY     as child  . torn rotator cuff  2010   right    There were no vitals filed for this visit.   Subjective Assessment - 04/14/20 0808    Subjective HEP is going "slow" because of back pain. No new complaints. No falls.    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 Decreased conspicuity of ventralepidural abscess, epidural phlegmon and spinal canal narrowing atthe T4-5 level. T4-5 cord edema, unchanged. 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 Partially imaged posterior spinal fusion hardware. Please note lackof intravenous contrast and susceptibility artifact limitsevaluation. These results will be called to the ordering clinician orrepresentative by the Radiologist Assistant, and communicationdocumented in the PACS or [employer]. AP and lateral thoracic spine demonstrates obliteration of the T4-5 disc space, there is more separation of the T 5 pedicle screws within the vertebral body of T5 suggesting increased loosening of the screws at this level. IMPRESSION:1. Fracture and loosening of both T5 pedicle screws which traversethe T5 superior endplate. 2. Loosening of the right T9 pedicle screw which traverses the rightcostovertebral junction. Question subtle loosening also of the otherintervening right thoracic pedicle screws T6 through T8. 3. Sequelae of discitis osteomyelitis suspected at T9-T10 withdeveloping interbody ankylosis. Severe bilateral T9 osseous neuralforaminal stenosis. Moderate to severe bilateral T10 osseousforaminal stenosis. 4. T11 through visible upper lumbar pedicle screws are intactwithout loosening. There is developing right side posterior elementankylosis at T11-T12, and developing bilateral posterior elementankylosis at T12-L1.    Patient Stated Goals "get stronger", some weight  gain/muscle gain, wants to continue to work on walking    Currently in Pain? Yes    Pain Score 3     Pain Location Back    Pain Orientation Upper;Mid    Pain Descriptors / Indicators Sore    Pain Type Chronic pain;Surgical pain    Pain Onset More than a month ago    Pain Frequency Constant    Aggravating Factors  certain movements, mal functioning hardward in spine    Pain Relieving Factors resting, medication                  [facility] Adult PT Treatment/Exercise - 04/14/20 1121      Transfers   Transfers Sit to Stand;Stand to Sit    Sit to Stand 5: Supervision    Stand to Sit 5: Supervision      Ambulation/Gait   Ambulation/Gait Yes    Ambulation/Gait Assistance 5: Supervision    Ambulation/Gait Assistance Details around gym with session    Assistive device None    Gait Pattern Step-through pattern;Wide base of support   decr bil push off    Ambulation Surface Level;Indoor      Exercises   Exercises Other Exercises    Other Exercises  reviewed and revised HEP to progress from supine to mostly standing addressing strengthening and balance. Cues needed for  correct form and technique. Min guard assist for balance ex's for safety.           Issued the following to HEP today:  Access Code: [email or web address] URL: [email or web address] Date: 04/14/2020 Prepared by: Willow Ora  Exercises Standing Soleus Stretch on Step - 1 x daily - 7 x weekly - 3 reps - 20 hold Seated Ankle Plantarflexion with Resistance - 1 x daily - 7 x weekly - 2 sets - 10 reps Heel Toe Raises with Counter Support - 1 x daily - 5 x weekly - 1 sets - 10 reps - 5 hold Squat with Counter Support - 1 x daily - 5 x weekly - 1 sets - 10 reps Side Lunge with Counter Support - 1 x daily - 5 x weekly - 1 sets - 10 reps Forward Lunge with Counter Support - 1 x daily - 5 x weekly - 1 sets - 10 reps Wide Stance with Eyes Closed on Foam Pad - 1 x daily - 5 x weekly - 1 sets - 3 reps - 30 hold Wide Stance  with Head Rotation on Foam Pad - 1 x daily - 5 x weekly - 1 sets - 10 reps      PT Education - 04/14/20 0842    Education Details revised and updated HEP for strengthening and balance in mostly standing    Person(s) Educated Patient    Methods Explanation;Demonstration;Tactile cues;Verbal cues;Handout    Comprehension Verbalized understanding;Returned demonstration;Verbal cues required;Tactile cues required;Need further instruction            PT Short Term Goals - 02/14/20 1207      PT SHORT TERM GOAL #1   Title Patient will demo 10 deg of Passive internal rotation of bil hips to reduce excessive external rotation during gait    Baseline Internal rotation PROM: 0 deg R; -5 deg L    Time 4    Period Weeks    Status New    Target Date 03/13/20      PT SHORT TERM GOAL #2   Title Patient will be able to perform heel raises 1 inch off the floor for 5 consecutive reps to improve calf strength to assist with push off    Baseline <1/2 inch- pt has excessive compensation and leans forward    Time 4    Period Weeks    Status New    Target Date 03/13/20             PT Long Term Goals - 04/02/20 2017      PT LONG TERM GOAL #1   Title Patient will be independent with ongoing/advanced HEP. ALL LTGS DUE 05/07/20    Baseline --    Time 5    Period Weeks    Status On-going    Target Date 05/07/20      PT LONG TERM GOAL #2   Title Pt will improve walking distance to at least 1,060' in 6MWT in order to improve walking endurance.    Baseline 988'    Time 5    Period Weeks    Status New      PT LONG TERM GOAL #3   Title Pt will improve FGA score to at least 17/30 in order to decr fall risk.    Baseline 13/30    Time 5    Period Weeks    Status New                 Plan -  04/14/20 7681    Clinical Impression Statement Today's skilled session focused on review and advancement of pt's HEP with rest breaks needed due to LE fatigue. No other issues noted or reported in  session. The pt is progressing and should benefit from continued PT to progress toward unmet goals    Personal Factors and Comorbidities Age;Time since onset of injury/illness/exacerbation;Past/Current Experience;Comorbidity 2    Examination-Activity Limitations Carry    Examination-Participation Restrictions Cleaning;Yard Work    [employer] Evolving/Moderate complexity    Rehab Potential Good    PT Frequency 2x / week    PT Duration 4 weeks    PT Treatment/Interventions ADLs/Self Care Home Management;Gait training;Stair training;Functional mobility training;Therapeutic activities;Neuromuscular re-education;Balance training;Therapeutic exercise;Patient/family education;Orthotic Fit/Training;Vestibular;Energy conservation    PT Next Visit Plan monitor BP with activity - is pt compliant with meds?  fuctional strength/balance with emphasis on activiities that promote push/toe off, needs ankle strengthening, balance on compliant surfaces    PT Home Exercise Plan Access Code: [email or web address]           Patient will benefit from skilled therapeutic intervention in order to improve the following deficits and impairments:  Abnormal gait, Decreased balance, Decreased endurance, Decreased range of motion, Decreased strength, Impaired sensation, Pain, Postural dysfunction, Decreased activity tolerance, Difficulty walking  Visit Diagnosis: Muscle weakness (generalized)  Unsteadiness on feet  Difficulty in walking, not elsewhere classified     Problem List Patient Active Problem List   Diagnosis Date Noted  . Hyperglycemia 03/22/2020  . Gastric ulcer 01/27/2020  . H/O colonoscopy with polypectomy 01/27/2020  . Protein-calorie malnutrition, severe 01/09/2020  . Weight loss 01/07/2020  . Abdominal pain 12/30/2019  . Nausea and vomiting 12/30/2019  . Weight loss, abnormal 12/30/2019  . Change in bowel habits 12/30/2019  . 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 Medication monitoring  encounter 08/26/2019  . Peripheral neuropathy 08/07/2019  . Alcohol abuse 05/09/2019  . Hyponatremia 05/05/2019  . 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[place]) 04/04/2019  . Fusion of spine of thoracolumbar region 03/12/2019  . Status post thoracic spinal fusion 02/12/2019  . 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 Thoracic spinal stenosis 04/17/2017  . Painful orthopaedic hardware ([place]) 04/17/2017  . 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 ([place]) 04/17/2017  . Low back pain radiating to both legs 01/16/2017  . Esophageal reflux 02/10/2015  . Medicare annual wellness visit, subsequent 02/10/2015  . Personal history of colonic polyps 10/27/2012  . 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  . BPH (benign prostatic hyperplasia) 04/23/2012  . ED (erectile dysfunction) 04/23/2012  . Allergic state 10/25/2011  . Anxiety and depression 10/25/2011  . Asthma   . Diabetes mellitus without complication ([place])   . Hyperlipidemia   . Overweight   . Insomnia   . Fatigue   . Preventative health care   . DDD (degenerative disc disease), lumbosacral   . Thoracic degenerative disc disease   . HTN (hypertension)     Willow Ora, PTA, [facility] [address], [place],  [address] [phone] 04/14/20, 11:25 AM   Name: DEBRA CALABRETTA MRN: [id number] Date of Birth: 1946/07/17

## 2020-04-14 NOTE — Patient Instructions (Signed)
Access Code: [email or web address] URL: [email or web address] Date: 04/14/2020 Prepared by: Willow Ora  Exercises Standing Soleus Stretch on Step - 1 x daily - 7 x weekly - 3 reps - 20 hold Seated Ankle Plantarflexion with Resistance - 1 x daily - 7 x weekly - 2 sets - 10 reps Heel Toe Raises with Counter Support - 1 x daily - 5 x weekly - 1 sets - 10 reps - 5 hold Squat with Counter Support - 1 x daily - 5 x weekly - 1 sets - 10 reps Side Lunge with Counter Support - 1 x daily - 5 x weekly - 1 sets - 10 reps Forward Lunge with Counter Support - 1 x daily - 5 x weekly - 1 sets - 10 reps Wide Stance with Eyes Closed on Foam Pad - 1 x daily - 5 x weekly - 1 sets - 3 reps - 30 hold Wide Stance with Head Rotation on Foam Pad - 1 x daily - 5 x weekly - 1 sets - 10 reps

## 2020-04-16 NOTE — Telephone Encounter (Signed)
Spoke with patient regarding symptoms.  Patient states he concerned about his inability to sleep, stomach issues resolved.  He reports sleeping approximately one hour each night x 2 months.  Would like to discuss potential medication side effects and proper timing to take during the day.  Scheduled to see PCP on Tuesday, 04/21/20.

## 2020-04-17 ENCOUNTER — Ambulatory Visit: Payer: Medicare Other | Attending: Family Medicine | Admitting: Physical Therapy

## 2020-04-17 ENCOUNTER — Telehealth: Payer: Self-pay | Admitting: Physical Therapy

## 2020-04-17 ENCOUNTER — Other Ambulatory Visit: Payer: Self-pay

## 2020-04-17 VITALS — BP 176/84 | HR 82

## 2020-04-17 DIAGNOSIS — M6281 Muscle weakness (generalized): Secondary | ICD-10-CM | POA: Diagnosis not present

## 2020-04-17 DIAGNOSIS — R262 Difficulty in walking, not elsewhere classified: Secondary | ICD-10-CM | POA: Diagnosis not present

## 2020-04-17 DIAGNOSIS — R2681 Unsteadiness on feet: Secondary | ICD-10-CM | POA: Diagnosis not present

## 2020-04-17 NOTE — Therapy (Signed)
[facility] [address] [place], [place], [address] Phone: [phone]   Fax:  [phone]  Physical Therapy Treatment  Patient Details  Name: Luis Brown MRN: [id number] Date of Birth: Aug 22, 1945 Referring Provider (PT): Dr. Penni Homans    Encounter Date: 04/17/2020   PT End of Session - 04/17/20 1012    Visit Number 8    Number of Visits 14    Date for PT Re-Evaluation 06/01/20   written for 30 day POC   Authorization Type [employer] [facility]- needs 10th visit progress note    Progress Note Due on Visit 10    PT Start Time 0933    PT Stop Time 1012    PT Time Calculation (min) 39 min    Equipment Utilized During Treatment Gait belt    Activity Tolerance Patient tolerated treatment well    Behavior During Therapy [facility] for tasks assessed/performed   pt needing to be redirected to the task at hand at times          Past Medical History:  Diagnosis Date  . Acute pharyngitis 10/21/2013  . Allergy    grass and pollen  . Anxiety and depression 10/25/2011  . BPH (benign prostatic hyperplasia) 04/23/2012  . Chicken pox as a child  . DDD (degenerative disc disease)    cervical responds to steroid injections and low back required surgery  . DDD (degenerative disc disease), lumbosacral   . Diabetes mellitus    pre  . Dyspnea   . ED (erectile dysfunction) 04/23/2012  . Elevated BP   . 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 Esophageal reflux 02/10/2015  . Fatigue   . HTN (hypertension)   . Hyperglycemia    preDM   . Hyperlipidemia   . Insomnia   . Low back pain radiating to both legs 01/16/2017  . Measles as a child  . Medicare annual wellness visit, subsequent 02/10/2015  . Overweight(278.02)   . Peripheral neuropathy 08/07/2019  . Personal history of colonic polyps 10/27/2012   Follows with [facility]  . Pneumonia    " walking"  . Preventative health care   . 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  . Wears glasses      Past Surgical History:  Procedure Laterality Date  . BACK SURGERY  2012 and 1994   Dr Margret Chance, screws and cage in low back  . BIOPSY  01/09/2020   Procedure: BIOPSY;  Surgeon: Otis Brace, MD;  Location: [facility];  Service: Gastroenterology;;  . COLONOSCOPY WITH PROPOFOL N/A 01/09/2020   Procedure: COLONOSCOPY WITH PROPOFOL;  Surgeon: Otis Brace, MD;  Location: [facility];  Service: Gastroenterology;  Laterality: N/A;  . ESOPHAGOGASTRODUODENOSCOPY (EGD) WITH PROPOFOL N/A 01/09/2020   Procedure: ESOPHAGOGASTRODUODENOSCOPY (EGD) WITH PROPOFOL;  Surgeon: Otis Brace, MD;  Location: [facility];  Service: Gastroenterology;  Laterality: N/A;  . EYE SURGERY Bilateral    2016  . HARDWARE REVISION  03/12/2019   REVISION OF HARDWARE THORACIC TEN-THORACIC ELEVEN WITH APPLICATION OF ADDITIONAL RODS AND ROD SLEEVES THORACIC EIGHT-THORACIC TWELVE 12-LUMBAR ONE (N/A )  . LAMINECTOMY  02/12/2019   DECOMPRESSIVE LAMINECTOMY THORACIC NINE-THORACIC TEN AND THORACIC TEN-THORACIC ELEVEN, EXTENSION OF THORACOLUMBAR FUSION FROM THORACIC TEN TO THORACIC FIVE, LOCAL BONE GRAFT, ALLOGRAFT AND VIVIGEN (N/A)  . POLYPECTOMY  01/09/2020   Procedure: POLYPECTOMY;  Surgeon: Otis Brace, MD;  Location: [facility];  Service: Gastroenterology;;  . SPINAL FUSION N/A 02/12/2019   Procedure: DECOMPRESSIVE LAMINECTOMY THORACIC NINE-THORACIC TEN AND THORACIC TEN-THORACIC ELEVEN, EXTENSION OF THORACOLUMBAR FUSION  FROM THORACIC TEN TO THORACIC FIVE, LOCAL BONE GRAFT, ALLOGRAFT AND VIVIGEN;  Surgeon: Jessy Oto, MD;  Location: [facility];  Service: Orthopedics;  Laterality: N/A;  DECOMPRESSIVE LAMINECTOMY THORACIC NINE-THORACIC TEN AND THORACIC TEN-THORACIC ELEVEN, EXTENSION OF TH  . SPINAL FUSION N/A 03/12/2019   Procedure: REVISION OF HARDWARE THORACIC TEN-THORACIC ELEVEN WITH APPLICATION OF ADDITIONAL RODS AND ROD SLEEVES THORACIC EIGHT-THORACIC TWELVE 12-LUMBAR ONE;  Surgeon: Jessy Oto, MD;  Location:  [facility];  Service: Orthopedics;  Laterality: N/A;  . TONSILLECTOMY    . TONSILLECTOMY     as child  . torn rotator cuff  2010   right    Vitals:   04/17/20 0938 04/17/20 0953 04/17/20 0958 04/17/20 1007  BP: (!) 152/84 (!) 194/81 (!) 174/85 (!) 176/84  Pulse: 78 85 89 82     Subjective Assessment - 04/17/20 0935    Subjective The HEP feels tiring. Stressed about with his son being in the hospital. Has been back on his BP medications.    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 Decreased conspicuity of ventralepidural abscess, epidural phlegmon and spinal canal narrowing atthe T4-5 level. T4-5 cord edema, unchanged. 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 Partially imaged posterior spinal fusion hardware. Please note lackof intravenous contrast and susceptibility artifact limitsevaluation. These results will be called to the ordering clinician orrepresentative by the Radiologist Assistant, and communicationdocumented in the PACS or [employer]. AP and lateral thoracic spine demonstrates obliteration of the T4-5 disc space, there is more separation of the T 5 pedicle screws within the vertebral body of T5 suggesting increased loosening of the screws at this level. IMPRESSION:1. Fracture and loosening of both T5 pedicle screws which traversethe T5 superior endplate. 2. Loosening of the right T9 pedicle screw which traverses the rightcostovertebral junction. Question subtle loosening also of the otherintervening right thoracic pedicle screws T6 through T8. 3. Sequelae of discitis osteomyelitis suspected at T9-T10 withdeveloping interbody ankylosis. Severe bilateral T9 osseous neuralforaminal stenosis. Moderate to severe bilateral T10 osseousforaminal stenosis. 4. T11 through visible upper lumbar pedicle screws are intactwithout loosening. There is developing right side posterior elementankylosis at  T11-T12, and developing bilateral posterior elementankylosis at T12-L1.    Patient Stated Goals "get stronger", some weight gain/muscle gain, wants to continue to work on walking    Pain Onset More than a month ago                     Access Code: [email or web address]: [email or web address] Date: 04/17/2020 Prepared by: Janann August  Reviewed bolded exercises below. Provided demo and verbal cues for proper technique and knee alignment - pt reporting it felt better with corrections.   Exercises Standing Soleus Stretch on Step - 1 x daily - 7 x weekly - 3 reps - 20 hold Seated Ankle Plantarflexion with Resistance - 1 x daily - 7 x weekly - 2 sets - 10 reps Heel Toe Raises with Counter Support - 1 x daily - 5 x weekly - 1 sets - 10 reps - 5 hold Squat with Counter Support - 1 x daily - 5 x weekly - 1 sets - 10 reps Side Lunge with Counter Support - 1 x daily - 5 x weekly - 1 sets - 10 reps Forward Lunge with Counter Support - 1 x daily - 5 x weekly - 1 sets - 10 reps Wide Stance with Eyes Closed on Foam Pad - 1  x daily - 5 x weekly - 1 sets - 3 reps - 30 hold Wide Stance with Head Rotation on Foam Pad - 1 x daily - 5 x weekly - 1 sets - 10 reps          [facility] Adult PT Treatment/Exercise - 04/17/20 0956      Self-Care   Self-Care Other Self-Care Comments    Other Self-Care Comments  Pt reports he is starting to take his BP medication (in smaller doses, but not entirely as prescribed).Assessed pt's BP at start of session and then approx. After 15 minutes of exercise pt with an incr of systolic of approx. 40 mm Hg to 194. Discussed with pt about discussing BP medication with PCP at visit next week, as this is a significant incr after minimal exercise and that BP systolic values for >338 can put pt at an incr risk of a CVA. Educated pt to monitor BP at home, especially after activity and to take BP medication as prescribed. PT will also send a message to pt's PCP to make  sure he is aware before appt.      Exercises   Other Exercises  while waiting for BP to decr: seated heel toe raises x20 reps      Knee/Hip Exercises: Standing   Other Standing Knee Exercises step up exercise with BUE support at stairs for PF push off: having one foot on first step and then pushing opposite leg off from ground and tapping 2nd step, cues to push off from toes x10 reps B              Balance Exercises - 04/17/20 0001      Balance Exercises: Standing   Rockerboard Anterior/posterior;EO;Limitations    Rockerboard Limitations x15 reps with no UE support working on PF and weight shifting onto toes (with cues for incr forward weight shift), then holding board steady 2 x 10 reps head turns             PT Education - 04/17/20 1012    Education Details see self care    Person(s) Educated Patient    Methods Explanation    Comprehension Verbalized understanding            PT Short Term Goals - 02/14/20 1207      PT SHORT TERM GOAL #1   Title Patient will demo 10 deg of Passive internal rotation of bil hips to reduce excessive external rotation during gait    Baseline Internal rotation PROM: 0 deg R; -5 deg L    Time 4    Period Weeks    Status New    Target Date 03/13/20      PT SHORT TERM GOAL #2   Title Patient will be able to perform heel raises 1 inch off the floor for 5 consecutive reps to improve calf strength to assist with push off    Baseline <1/2 inch- pt has excessive compensation and leans forward    Time 4    Period Weeks    Status New    Target Date 03/13/20             PT Long Term Goals - 04/02/20 2017      PT LONG TERM GOAL #1   Title Patient will be independent with ongoing/advanced HEP. ALL LTGS DUE 05/07/20    Baseline --    Time 5    Period Weeks    Status On-going    Target Date  05/07/20      PT LONG TERM GOAL #2   Title Pt will improve walking distance to at least 1,060' in 6MWT in order to improve walking endurance.     Baseline 988'    Time 5    Period Weeks    Status New      PT LONG TERM GOAL #3   Title Pt will improve FGA score to at least 17/30 in order to decr fall risk.    Baseline 13/30    Time 5    Period Weeks    Status New                 Plan - 04/17/20 1149    Clinical Impression Statement Pt's BP elevated after activity which limited amount of activity performed throughout session (see vitals above). Pt asymptomatic throughout. Pt reports that he has started taking his medication, but in smaller doses and not entirely as prescribed. Pt also reports incr stressors in his life that could be contributing. Pt sees PCP next week - PT educated pt to discuss with PCP about BP management. Session focused on reviewing HEP for BLE strength and standing balance on rockerboard once BP was decr. Rest breaks taken in between. Will continue to progress towards LTGs.    Personal Factors and Comorbidities Age;Time since onset of injury/illness/exacerbation;Past/Current Experience;Comorbidity 2    Examination-Activity Limitations Carry    Examination-Participation Restrictions Cleaning;Yard Work    [employer]chant [employer] officer Evolving/Moderate complexity    Rehab Potential Good    PT Frequency 2x / week    PT Duration 4 weeks    PT Treatment/Interventions ADLs/Self Care Home Management;Gait training;Stair training;Functional mobility training;Therapeutic activities;Neuromuscular re-education;Balance training;Therapeutic exercise;Patient/family education;Orthotic Fit/Training;Vestibular;Energy conservation    PT Next Visit Plan monitor BP with activity - is pt compliant with meds?  fuctional strength/balance with emphasis on activiities that promote push/toe off, needs ankle strengthening, balance on compliant surfaces    PT Home Exercise Plan Access Code: [email or web address]           Patient will benefit from skilled therapeutic intervention in order to improve the following deficits and  impairments:  Abnormal gait, Decreased balance, Decreased endurance, Decreased range of motion, Decreased strength, Impaired sensation, Pain, Postural dysfunction, Decreased activity tolerance, Difficulty walking  Visit Diagnosis: Muscle weakness (generalized)  Unsteadiness on feet  Difficulty in walking, not elsewhere classified     Problem List Patient Active Problem List   Diagnosis Date Noted  . Hyperglycemia 03/22/2020  . Gastric ulcer 01/27/2020  . H/O colonoscopy with polypectomy 01/27/2020  . Protein-calorie malnutrition, severe 01/09/2020  . Weight loss 01/07/2020  . Abdominal pain 12/30/2019  . Nausea and vomiting 12/30/2019  . Weight loss, abnormal 12/30/2019  . Change in bowel habits 12/30/2019  . 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 Medication monitoring encounter 08/26/2019  . Peripheral neuropathy 08/07/2019  . Alcohol abuse 05/09/2019  . Hyponatremia 05/05/2019  . 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[place]) 04/04/2019  . Fusion of spine of thoracolumbar region 03/12/2019  . Status post thoracic spinal fusion 02/12/2019  . 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 Thoracic spinal stenosis 04/17/2017  . Painful orthopaedic hardware ([place]) 04/17/2017  . 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 ([place]) 04/17/2017  . Low back pain radiating to both legs 01/16/2017  . Esophageal reflux 02/10/2015  . Medicare annual wellness visit, subsequent 02/10/2015  . Personal history of colonic polyps 10/27/2012  . Testosterone deficiency  05/23/2012  . BPH (benign prostatic hyperplasia) 04/23/2012  . ED (erectile dysfunction) 04/23/2012  . Allergic state 10/25/2011  . Anxiety and depression 10/25/2011  . Asthma   . Diabetes mellitus without complication ([place])   . Hyperlipidemia   . Overweight   . Insomnia   . Fatigue   . Preventative health care   . DDD (degenerative disc disease),  lumbosacral   . Thoracic degenerative disc disease   . HTN (hypertension)     Arliss Journey, PT, DPT  04/17/2020, 11:51 AM  [facility] [address] [place], [place], [address] Phone: [phone]   Fax:  [phone]  Name: RIGHTEOUS CLAIBORNE MRN: [id number] Date of Birth: February 22, 1946

## 2020-04-17 NOTE — Telephone Encounter (Signed)
Mackie Pai PA-C,  I have been seeing your patient Luis Brown. Luis Brown for Physical Therapy at [facility]. He has had elevated BP values after approx. 15 minutes of activity at PT. Today resting at beginning of session it was 152/84 and after minimal standing exercise, it increased to 194/81. He reports he has been re-starting his lisinopril, but not taking it entirely as prescribed. I wanted to make you aware as you see him next week.   Thanks, Janann August, PT, DPT 04/17/20 11:55 AM

## 2020-04-18 ENCOUNTER — Other Ambulatory Visit: Payer: Self-pay | Admitting: Medical

## 2020-04-20 NOTE — Telephone Encounter (Signed)
Reviewed PT note. Seeing pt tomorrow.

## 2020-04-21 ENCOUNTER — Ambulatory Visit (INDEPENDENT_AMBULATORY_CARE_PROVIDER_SITE_OTHER): Payer: Medicare Other | Admitting: Medical

## 2020-04-21 ENCOUNTER — Encounter: Payer: Self-pay | Admitting: Physical Therapy

## 2020-04-21 ENCOUNTER — Ambulatory Visit: Payer: Medicare Other | Admitting: Physical Therapy

## 2020-04-21 ENCOUNTER — Other Ambulatory Visit: Payer: Self-pay

## 2020-04-21 VITALS — BP 181/85

## 2020-04-21 VITALS — BP 134/96 | HR 87 | Temp 98.2°F | Resp 18 | Ht 70.0 in | Wt 172.0 lb

## 2020-04-21 DIAGNOSIS — G4700 Insomnia, unspecified: Secondary | ICD-10-CM

## 2020-04-21 DIAGNOSIS — I10 Essential (primary) hypertension: Secondary | ICD-10-CM | POA: Diagnosis not present

## 2020-04-21 DIAGNOSIS — G8929 Other chronic pain: Secondary | ICD-10-CM | POA: Diagnosis not present

## 2020-04-21 DIAGNOSIS — F32A Depression, unspecified: Secondary | ICD-10-CM

## 2020-04-21 DIAGNOSIS — K259 Gastric ulcer, unspecified as acute or chronic, without hemorrhage or perforation: Secondary | ICD-10-CM

## 2020-04-21 DIAGNOSIS — E119 Type 2 diabetes mellitus without complications: Secondary | ICD-10-CM

## 2020-04-21 DIAGNOSIS — M6281 Muscle weakness (generalized): Secondary | ICD-10-CM

## 2020-04-21 DIAGNOSIS — R2681 Unsteadiness on feet: Secondary | ICD-10-CM

## 2020-04-21 DIAGNOSIS — R262 Difficulty in walking, not elsewhere classified: Secondary | ICD-10-CM | POA: Diagnosis not present

## 2020-04-21 DIAGNOSIS — F419 Anxiety disorder, unspecified: Secondary | ICD-10-CM

## 2020-04-21 MED ORDER — HYDROXYZINE HCL 10 MG PO TABS
ORAL_TABLET | ORAL | 0 refills | Status: DC
Start: 2020-04-21 — End: 2020-04-28

## 2020-04-21 NOTE — Patient Instructions (Signed)
You have recent severe insomnia prescribing account is only sleeping 1 hour at night. Recently pain management take you off of Valium and you report side effects with Ambien in the past. Recently tried over-the-counter cough syrup that had antihistamine and found it allowed you to get 5 hours of sleep at night. In light of this I think is reasonable option to give you very low dose of hydroxyzine 10 mg to use just at night. Rx advisement given. Not to use recent cough syrup with antihistamine. Patient expressed understanding. Also explained patient to be careful ambulating at night when needing to use restroom.  Hypertension history. He had been off of lisinopril and recently restarted. His blood pressures are now better that he restarted back on lisinopril.   For diabetes, continue Januvia and Metformin.  Chronic pain, continue Norco as prescribed by pain management.  For anxiety and depression fairly well controlled presently. Continue BuSpar 7.5 mg one half tab twice daily. Continue Effexor as well.  Follow through with endoscopy scheduled by GI MD to evaluate history of ulcer.  Follow-up in about 2weeks on the same day that you are scheduled to get testosterone injection.

## 2020-04-21 NOTE — Progress Notes (Signed)
   Subjective:    [name], male    DOB: [birth year], 74 y.o.   MRN: [id number]  HPI  Pt in for follow up. Pt state he has trouble sleeping. He can't relax. He feels like can't relax his mind. Pt states sleeping very minimally at night. He states he was sleeping for 1 hour at night for past 2 months. Pt got otc allergy/cough med and he states he could sleep about 4-5 hours at night. Ambien gave him bad dreams.  Pt is on buspar for anxiety.  Pt tells me he is no longer taking valium. Pain management stopped this.  Pt is seeing pain management for his back pain. He is on norco.  Pt bp recently was increasing. He just now restarted lisinipril 2.5 mg in am and 5 mg at night. But in past his bp was low.   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  Pt also has hx of ulcers. Pt having EGD on Oct 19 th.    Review of Systems     Objective:   Physical Exam  General Mental Status- Alert. General Appearance- Not in acute distress.   Skin General: Color- Normal Color. Moisture- Normal Moisture.  Neck Carotid Arteries- Normal color. Moisture- Normal Moisture. No carotid bruits. No JVD.  Chest and Lung Exam Auscultation: Breath Sounds:-Normal.  Cardiovascular Auscultation:Rythm- Regular. Murmurs & Other Heart Sounds:Auscultation of the heart reveals- No Murmurs.  Abdomen Inspection:-Inspeection Normal. Palpation/Percussion:Note:No mass. Palpation and Percussion of the abdomen reveal- Non Tender, Non Distended + BS, no rebound or guarding.    Neurologic Cranial Nerve exam:- CN III-XII intact(No nystagmus), symmetric smile. Strength:- 5/5 equal and symmetric strength both upper and lower extremities.      Assessment & Plan:  You have recent severe insomnia prescribing account is only sleeping 1 hour at night. Recently pain management take you off of Valium and you report side effects with Ambien in the past. Recently tried over-the-counter cough  syrup that had antihistamine and found it allowed you to get 5 hours of sleep at night. In light of this I think is reasonable option to give you very low dose of hydroxyzine 10 mg to use just at night. Rx advisement given. Not to use recent cough syrup with antihistamine. Patient expressed understanding. Also explained patient to be careful ambulating at night when needing to use restroom.  Hypertension history. He had been off of lisinopril and recently restarted. His blood pressures are now better that he restarted back on lisinopril.   For diabetes, continue Januvia and Metformin.  Chronic pain, continue Norco as prescribed by pain management.  For anxiety and depression fairly well controlled presently. Continue BuSpar 7.5 mg one half tab twice daily. Continue Effexor as well.  Follow through with endoscopy scheduled by GI MD to evaluate history of ulcer.  Follow-up in about 2weeks on the same day that you are scheduled to get testosterone injection.

## 2020-04-22 NOTE — Therapy (Signed)
[facility] [address] [place], [place], [address] Phone: [phone]   Fax:  [phone]  Physical Therapy Treatment  Patient Details  Name: Luis Brown MRN: [id number] Date of Birth: Apr 22, 1946 Referring Provider (PT): Dr. Penni Homans    Encounter Date: 04/21/2020   04/21/20 1109  PT Visits / Re-Eval  Visit Number 9  Number of Visits 14  Date for PT Re-Evaluation 06/01/20 (written for 30 day POC)  Authorization  Authorization Type [employer] [facility]- needs 10th visit progress note  Progress Note Due on Visit 10  PT Time Calculation  PT Start Time 1104  PT Stop Time 1145  PT Time Calculation (min) 41 min  PT - End of Session  Equipment Utilized During Treatment Gait belt  Activity Tolerance Patient tolerated treatment well  Behavior During Therapy [facility] for tasks assessed/performed (pt needing to be redirected to the task at hand at times)     Past Medical History:  Diagnosis Date  . Acute pharyngitis 10/21/2013  . Allergy    grass and pollen  . Anxiety and depression 10/25/2011  . BPH (benign prostatic hyperplasia) 04/23/2012  . Chicken pox as a child  . DDD (degenerative disc disease)    cervical responds to steroid injections and low back required surgery  . DDD (degenerative disc disease), lumbosacral   . Diabetes mellitus    pre  . Dyspnea   . ED (erectile dysfunction) 04/23/2012  . Elevated BP   . 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 Esophageal reflux 02/10/2015  . Fatigue   . HTN (hypertension)   . Hyperglycemia    preDM   . Hyperlipidemia   . Insomnia   . Low back pain radiating to both legs 01/16/2017  . Measles as a child  . Medicare annual wellness visit, subsequent 02/10/2015  . Overweight(278.02)   . Peripheral neuropathy 08/07/2019  . Personal history of colonic polyps 10/27/2012   Follows with [facility]  . Pneumonia    " walking"  . Preventative health care   . Testosterone deficiency  05/23/2012  . Wears glasses     Past Surgical History:  Procedure Laterality Date  . BACK SURGERY  2012 and 1994   Dr Margret Chance, screws and cage in low back  . BIOPSY  01/09/2020   Procedure: BIOPSY;  Surgeon: Otis Brace, MD;  Location: [facility];  Service: Gastroenterology;;  . COLONOSCOPY WITH PROPOFOL N/A 01/09/2020   Procedure: COLONOSCOPY WITH PROPOFOL;  Surgeon: Otis Brace, MD;  Location: [facility];  Service: Gastroenterology;  Laterality: N/A;  . ESOPHAGOGASTRODUODENOSCOPY (EGD) WITH PROPOFOL N/A 01/09/2020   Procedure: ESOPHAGOGASTRODUODENOSCOPY (EGD) WITH PROPOFOL;  Surgeon: Otis Brace, MD;  Location: [facility];  Service: Gastroenterology;  Laterality: N/A;  . EYE SURGERY Bilateral    2016  . HARDWARE REVISION  03/12/2019   REVISION OF HARDWARE THORACIC TEN-THORACIC ELEVEN WITH APPLICATION OF ADDITIONAL RODS AND ROD SLEEVES THORACIC EIGHT-THORACIC TWELVE 12-LUMBAR ONE (N/A )  . LAMINECTOMY  02/12/2019   DECOMPRESSIVE LAMINECTOMY THORACIC NINE-THORACIC TEN AND THORACIC TEN-THORACIC ELEVEN, EXTENSION OF THORACOLUMBAR FUSION FROM THORACIC TEN TO THORACIC FIVE, LOCAL BONE GRAFT, ALLOGRAFT AND VIVIGEN (N/A)  . POLYPECTOMY  01/09/2020   Procedure: POLYPECTOMY;  Surgeon: Otis Brace, MD;  Location: [facility];  Service: Gastroenterology;;  . SPINAL FUSION N/A 02/12/2019   Procedure: DECOMPRESSIVE LAMINECTOMY THORACIC NINE-THORACIC TEN AND THORACIC TEN-THORACIC ELEVEN, EXTENSION OF THORACOLUMBAR FUSION FROM THORACIC TEN TO THORACIC FIVE, LOCAL BONE GRAFT, ALLOGRAFT AND VIVIGEN;  Surgeon: Jessy Oto, MD;  Location: [facility];  Service: Orthopedics;  Laterality: N/A;  DECOMPRESSIVE LAMINECTOMY THORACIC NINE-THORACIC TEN AND THORACIC TEN-THORACIC ELEVEN, EXTENSION OF TH  . SPINAL FUSION N/A 03/12/2019   Procedure: REVISION OF HARDWARE THORACIC TEN-THORACIC ELEVEN WITH APPLICATION OF ADDITIONAL RODS AND ROD SLEEVES THORACIC EIGHT-THORACIC TWELVE 12-LUMBAR ONE;   Surgeon: Jessy Oto, MD;  Location: [facility];  Service: Orthopedics;  Laterality: N/A;  . TONSILLECTOMY    . TONSILLECTOMY     as child  . torn rotator cuff  2010   right    Vitals:   04/21/20 1106 04/21/20 1132  BP: (!) 177/95 (!) 181/85     04/21/20 1106  Symptoms/Limitations  Subjective Reports he has been busy this weekend and that his BP has been running better. No falls.  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 Decreased conspicuity of ventralepidural abscess, epidural phlegmon and spinal canal narrowing atthe T4-5 level. T4-5 cord edema, unchanged. 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 Partially imaged posterior spinal fusion hardware. Please note lackof intravenous contrast and susceptibility artifact limitsevaluation. These results will be called to the ordering clinician orrepresentative by the Radiologist Assistant, and communicationdocumented in the PACS or [employer]. AP and lateral thoracic spine demonstrates obliteration of the T4-5 disc space, there is more separation of the T 5 pedicle screws within the vertebral body of T5 suggesting increased loosening of the screws at this level. IMPRESSION:1. Fracture and loosening of both T5 pedicle screws which traversethe T5 superior endplate. 2. Loosening of the right T9 pedicle screw which traverses the rightcostovertebral junction. Question subtle loosening also of the otherintervening right thoracic pedicle screws T6 through T8. 3. Sequelae of discitis osteomyelitis suspected at T9-T10 withdeveloping interbody ankylosis. Severe bilateral T9 osseous neuralforaminal stenosis. Moderate to severe bilateral T10 osseousforaminal stenosis. 4. T11 through visible upper lumbar pedicle screws are intactwithout loosening. There is developing right side posterior elementankylosis at T11-T12, and developing bilateral posterior elementankylosis at  T12-L1.  Patient Stated Goals "get stronger", some weight gain/muscle gain, wants to continue to work on walking  Pain Assessment  Currently in Pain? Yes  Pain Score 3  Pain Location Back  Pain Orientation Upper;Mid  Pain Descriptors / Indicators Aching;Sore  Pain Type Chronic pain;Surgical pain  Pain Onset More than a month ago  Pain Frequency Constant  Aggravating Factors  certain movements, mal functioning hardware in spine  Pain Relieving Factors resting, medication      04/21/20 1111  Transfers  Transfers Sit to Stand;Stand to Sit  Sit to Stand 5: Supervision  Stand to Sit 5: Supervision  Ambulation/Gait  Ambulation/Gait Yes  Ambulation/Gait Assistance 4: Min guard  Ambulation/Gait Assistance Details min guard assist for safety with pt scanning enviroment in random directions.   Ambulation Distance (Feet) 500 Feet  Assistive device None  Gait Pattern Step-through pattern;Wide base of support  Ambulation Surface Level;Unlevel;Indoor;Outdoor;Paved (decr bil push off, tends to stay on heels)      04/21/20 1118  Balance Exercises: Standing  Step Ups Forward;Limitations;Intermittent UE support  Step Ups Limitations floor<>balance board<> 6 inch step- reciprocal stepping with contralateral march with non stance leg on step for 10 reps each leg, alternating LEs.   Tandem Gait Forward;Retro;Upper extremity support;Foam/compliant surface;3 reps;Limitations  Tandem Gait Limitations on blue foam beam- 3 laps each with cues on posture, step position and light touch on bars  Sidestepping Foam/compliant support;Upper extremity support;3 reps;Limitations  Sidestepping Limitations on blue foam beam- 3 las each  way with no UE support on bars, cues on step length, weight shifitng and posture. no UE support with occasional touch to bars for balance.   Other Standing Exercises Comments BOSU blue side up: bil LE's working on minisquats with emphasis on heel raise when returning to tall  standing for 10 reps; Inverted BOSU: rocking fwd/bwd, then laterally for 10 reps each, then minisquats with emphasis on toe down with squats and heels down with return to full standing for 10 reps. light UE support on bars with cues on form/technique.                                  PT Short Term Goals - 02/14/20 1207      PT SHORT TERM GOAL #1   Title Patient will demo 10 deg of Passive internal rotation of bil hips to reduce excessive external rotation during gait    Baseline Internal rotation PROM: 0 deg R; -5 deg L    Time 4    Period Weeks    Status New    Target Date 03/13/20      PT SHORT TERM GOAL #2   Title Patient will be able to perform heel raises 1 inch off the floor for 5 consecutive reps to improve calf strength to assist with push off    Baseline <1/2 inch- pt has excessive compensation and leans forward    Time 4    Period Weeks    Status New    Target Date 03/13/20             PT Long Term Goals - 04/02/20 2017      PT LONG TERM GOAL #1   Title Patient will be independent with ongoing/advanced HEP. ALL LTGS DUE 05/07/20    Baseline --    Time 5    Period Weeks    Status On-going    Target Date 05/07/20      PT LONG TERM GOAL #2   Title Pt will improve walking distance to at least 1,060' in 6MWT in order to improve walking endurance.    Baseline 988'    Time 5    Period Weeks    Status New      PT LONG TERM GOAL #3   Title Pt will improve FGA score to at least 17/30 in order to decr fall risk.    Baseline 13/30    Time 5    Period Weeks    Status New             04/21/20 1109  Plan  Clinical Impression Statement Today's skilled session continued to focus on dynamic gait and balance reactions with rest breaks taken as needed. BP stable with session. The pt is progressing toward goals and should benefit from continued PT to progress toward unmet goals.  Personal Factors and Comorbidities Age;Time since onset of  injury/illness/exacerbation;Past/Current Experience;Comorbidity 2  Examination-Activity Limitations Carry  Examination-Participation Restrictions Cleaning;Yard Work  Pt will benefit from skilled therapeutic intervention in order to improve on the following deficits Abnormal gait;Decreased balance;Decreased endurance;Decreased range of motion;Decreased strength;Impaired sensation;Pain;Postural dysfunction;Decreased activity tolerance;Difficulty walking  Stability/Clinical Decision Making Evolving/Moderate complexity  Rehab Potential Good  PT Frequency 2x / week  PT Duration 4 weeks  PT Treatment/Interventions ADLs/Self Care Home Management;Gait training;Stair training;Functional mobility training;Therapeutic activities;Neuromuscular re-education;Balance training;Therapeutic exercise;Patient/family education;Orthotic Fit/Training;Vestibular;Energy conservation  PT Next Visit Plan monitor BP with activity. 10th visit progress  note.  fuctional strength/balance with emphasis on activiities that promote push/toe off, needs ankle strengthening, balance on compliant surfaces  PT Home Exercise Plan Access Code: [email or web address]          Patient will benefit from skilled therapeutic intervention in order to improve the following deficits and impairments:  Abnormal gait, Decreased balance, Decreased endurance, Decreased range of motion, Decreased strength, Impaired sensation, Pain, Postural dysfunction, Decreased activity tolerance, Difficulty walking  Visit Diagnosis: Muscle weakness (generalized)  Unsteadiness on feet  Difficulty in walking, not elsewhere classified     Problem List Patient Active Problem List   Diagnosis Date Noted  . Hyperglycemia 03/22/2020  . Gastric ulcer 01/27/2020  . H/O colonoscopy with polypectomy 01/27/2020  . Protein-calorie malnutrition, severe 01/09/2020  . Weight loss 01/07/2020  . Abdominal pain 12/30/2019  . Nausea and vomiting 12/30/2019  . Weight loss,  abnormal 12/30/2019  . Change in bowel habits 12/30/2019  . 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 Medication monitoring encounter 08/26/2019  . Peripheral neuropathy 08/07/2019  . Alcohol abuse 05/09/2019  . Hyponatremia 05/05/2019  . 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[place]) 04/04/2019  . Fusion of spine of thoracolumbar region 03/12/2019  . Status post thoracic spinal fusion 02/12/2019  . 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 Thoracic spinal stenosis 04/17/2017  . Painful orthopaedic hardware ([place]) 04/17/2017  . 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 ([place]) 04/17/2017  . Low back pain radiating to both legs 01/16/2017  . Esophageal reflux 02/10/2015  . Medicare annual wellness visit, subsequent 02/10/2015  . Personal history of colonic polyps 10/27/2012  . 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  . BPH (benign prostatic hyperplasia) 04/23/2012  . ED (erectile dysfunction) 04/23/2012  . Allergic state 10/25/2011  . Anxiety and depression 10/25/2011  . Asthma   . Diabetes mellitus without complication ([place])   . Hyperlipidemia   . Overweight   . Insomnia   . Fatigue   . Preventative health care   . DDD (degenerative disc disease), lumbosacral   . Thoracic degenerative disc disease   . HTN (hypertension)    Willow Ora, PTA, [facility] [address], [place], [place] [address] [phone] 04/22/20, 10:56 PM   Name: Luis Brown MRN: [id number] Date of Birth: 1945-09-02

## 2020-04-23 ENCOUNTER — Encounter: Payer: Self-pay | Admitting: Medical

## 2020-04-23 ENCOUNTER — Ambulatory Visit
Admission: RE | Admit: 2020-04-23 | Discharge: 2020-04-23 | Disposition: A | Discharge status: Home / Self Care | Payer: Medicare Other | Source: Ambulatory Visit | Attending: Specialist | Admitting: Specialist

## 2020-04-23 ENCOUNTER — Other Ambulatory Visit: Payer: Self-pay

## 2020-04-23 ENCOUNTER — Ambulatory Visit: Payer: Medicare Other | Admitting: Specialist

## 2020-04-23 DIAGNOSIS — M4649 Discitis, unspecified, multiple sites in spine: Secondary | ICD-10-CM | POA: Diagnosis not present

## 2020-04-23 DIAGNOSIS — M4624 Osteomyelitis of vertebra, thoracic region: Secondary | ICD-10-CM

## 2020-04-23 DIAGNOSIS — R531 Weakness: Secondary | ICD-10-CM

## 2020-04-23 DIAGNOSIS — G061 Intraspinal abscess and granuloma: Secondary | ICD-10-CM | POA: Diagnosis not present

## 2020-04-23 DIAGNOSIS — M40294 Other kyphosis, thoracic region: Secondary | ICD-10-CM

## 2020-04-23 DIAGNOSIS — G9519 Other vascular myelopathies: Secondary | ICD-10-CM | POA: Diagnosis not present

## 2020-04-23 DIAGNOSIS — T8484XA Pain due to internal orthopedic prosthetic devices, implants and grafts, initial encounter: Secondary | ICD-10-CM

## 2020-04-23 MED ORDER — GADOBENATE DIMEGLUMINE 529 MG/ML IV SOLN
20.0000 mL | Freq: Once | INTRAVENOUS | Status: AC | PRN
  Administered 2020-04-23: 20 mL via INTRAVENOUS

## 2020-04-24 ENCOUNTER — Ambulatory Visit: Payer: Medicare Other | Admitting: Physical Therapy

## 2020-04-24 ENCOUNTER — Encounter: Payer: Self-pay | Admitting: Medical

## 2020-04-24 VITALS — BP 180/94 | HR 81

## 2020-04-24 DIAGNOSIS — R2681 Unsteadiness on feet: Secondary | ICD-10-CM

## 2020-04-24 DIAGNOSIS — M6281 Muscle weakness (generalized): Secondary | ICD-10-CM

## 2020-04-24 DIAGNOSIS — R262 Difficulty in walking, not elsewhere classified: Secondary | ICD-10-CM | POA: Diagnosis not present

## 2020-04-24 NOTE — Therapy (Addendum)
[facility] [address] [place], [place], [address] Phone: [phone]   Fax:  [phone]  Physical Therapy Treatment  Patient Details  Name: Luis Brown MRN: [id number] Date of Birth: [birth year] Referring Provider (PT): Dr. Penni Homans    Encounter Date: 04/24/2020   PT End of Session - 04/24/20 1012    Visit Number 10    Number of Visits 14    Date for PT Re-Evaluation 06/01/20   written for 30 day POC   Authorization Type [employer] [facility]- needs 10th visit progress note    Progress Note Due on Visit 10    PT Start Time 0930    PT Stop Time 1011    PT Time Calculation (min) 41 min    Equipment Utilized During Treatment Gait belt    Activity Tolerance Patient tolerated treatment well    Behavior During Therapy [facility] for tasks assessed/performed   pt needing to be redirected to the task at hand at times          Past Medical History:  Diagnosis Date  . Acute pharyngitis 10/21/2013  . Allergy    grass and pollen  . Anxiety and depression 10/25/2011  . BPH (benign prostatic hyperplasia) 04/23/2012  . Chicken pox as a child  . DDD (degenerative disc disease)    cervical responds to steroid injections and low back required surgery  . DDD (degenerative disc disease), lumbosacral   . Diabetes mellitus    pre  . Dyspnea   . ED (erectile dysfunction) 04/23/2012  . Elevated BP   . 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 Esophageal reflux 02/10/2015  . Fatigue   . HTN (hypertension)   . Hyperglycemia    preDM   . Hyperlipidemia   . Insomnia   . Low back pain radiating to both legs 01/16/2017  . Measles as a child  . Medicare annual wellness visit, subsequent 02/10/2015  . Overweight(278.02)   . Peripheral neuropathy 08/07/2019  . Personal history of colonic polyps 10/27/2012   Follows with [facility]  . Pneumonia    " walking"  . Preventative health care   . 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  . Wears glasses      Past Surgical History:  Procedure Laterality Date  . BACK SURGERY  2012 and 1994   Dr Margret Chance, screws and cage in low back  . BIOPSY  01/09/2020   Procedure: BIOPSY;  Surgeon: Otis Brace, MD;  Location: [facility];  Service: Gastroenterology;;  . COLONOSCOPY WITH PROPOFOL N/A 01/09/2020   Procedure: COLONOSCOPY WITH PROPOFOL;  Surgeon: Otis Brace, MD;  Location: [facility];  Service: Gastroenterology;  Laterality: N/A;  . ESOPHAGOGASTRODUODENOSCOPY (EGD) WITH PROPOFOL N/A 01/09/2020   Procedure: ESOPHAGOGASTRODUODENOSCOPY (EGD) WITH PROPOFOL;  Surgeon: Otis Brace, MD;  Location: [facility];  Service: Gastroenterology;  Laterality: N/A;  . EYE SURGERY Bilateral    2016  . HARDWARE REVISION  03/12/2019   REVISION OF HARDWARE THORACIC TEN-THORACIC ELEVEN WITH APPLICATION OF ADDITIONAL RODS AND ROD SLEEVES THORACIC EIGHT-THORACIC TWELVE 12-LUMBAR ONE (N/A )  . LAMINECTOMY  02/12/2019   DECOMPRESSIVE LAMINECTOMY THORACIC NINE-THORACIC TEN AND THORACIC TEN-THORACIC ELEVEN, EXTENSION OF THORACOLUMBAR FUSION FROM THORACIC TEN TO THORACIC FIVE, LOCAL BONE GRAFT, ALLOGRAFT AND VIVIGEN (N/A)  . POLYPECTOMY  01/09/2020   Procedure: POLYPECTOMY;  Surgeon: Otis Brace, MD;  Location: [facility];  Service: Gastroenterology;;  . SPINAL FUSION N/A 02/12/2019   Procedure: DECOMPRESSIVE LAMINECTOMY THORACIC NINE-THORACIC TEN AND THORACIC TEN-THORACIC ELEVEN, EXTENSION OF THORACOLUMBAR FUSION  FROM THORACIC TEN TO THORACIC FIVE, LOCAL BONE GRAFT, ALLOGRAFT AND VIVIGEN;  Surgeon: Jessy Oto, MD;  Location: [place];  Service: Orthopedics;  Laterality: N/A;  DECOMPRESSIVE LAMINECTOMY THORACIC NINE-THORACIC TEN AND THORACIC TEN-THORACIC ELEVEN, EXTENSION OF TH  . SPINAL FUSION N/A 03/12/2019   Procedure: REVISION OF HARDWARE THORACIC TEN-THORACIC ELEVEN WITH APPLICATION OF ADDITIONAL RODS AND ROD SLEEVES THORACIC EIGHT-THORACIC TWELVE 12-LUMBAR ONE;  Surgeon: Jessy Oto, MD;   Location: [place];  Service: Orthopedics;  Laterality: N/A;  . TONSILLECTOMY    . TONSILLECTOMY     as child  . torn rotator cuff  2010   right    Vitals:   04/24/20 0935 04/24/20 0958  BP: (!) 163/84 (!) 180/94  Pulse: 66 81     Subjective Assessment - 04/24/20 0932    Subjective Feeling more tired today. taking one whole lisonopril in the morning and half at night. Thinks that he is getting stronger, not feeling like he is staggering around that much.    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 Decreased conspicuity of ventralepidural abscess, epidural phlegmon and spinal canal narrowing atthe T4-5 level. T4-5 cord edema, unchanged. 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 Partially imaged posterior spinal fusion hardware. Please note lackof intravenous contrast and susceptibility artifact limitsevaluation. These results will be called to the ordering clinician orrepresentative by the Radiologist Assistant, and communicationdocumented in the PACS or [employer]. AP and lateral thoracic spine demonstrates obliteration of the T4-5 disc space, there is more separation of the T 5 pedicle screws within the vertebral body of T5 suggesting increased loosening of the screws at this level. IMPRESSION:1. Fracture and loosening of both T5 pedicle screws which traversethe T5 superior endplate. 2. Loosening of the right T9 pedicle screw which traverses the rightcostovertebral junction. Question subtle loosening also of the otherintervening right thoracic pedicle screws T6 through T8. 3. Sequelae of discitis osteomyelitis suspected at T9-T10 withdeveloping interbody ankylosis. Severe bilateral T9 osseous neuralforaminal stenosis. Moderate to severe bilateral T10 osseousforaminal stenosis. 4. T11 through visible upper lumbar pedicle screws are intactwithout loosening. There is developing right side posterior  elementankylosis at T11-T12, and developing bilateral posterior elementankylosis at T12-L1.    Patient Stated Goals "get stronger", some weight gain/muscle gain, wants to continue to work on walking    Currently in Pain? Yes    Pain Score 3     Pain Location Back    Pain Orientation Upper;Mid    Pain Descriptors / Indicators --   feels like its pressing   Pain Type Chronic pain    Pain Onset More than a month ago                             [facility] Adult PT Treatment/Exercise - 04/24/20 0001      Self-Care   Self-Care Other Self-Care Comments    Other Self-Care Comments  Pt's BP still elevated after activity, pt reporting he is still not taking his medication originally as prescribed (did not talk to his doctor about this when he saw him the other day), pt sees his physician again on the 12th, discussed importance of discussing medications with his physician and provided pt with a BP log to monitor BP values at home to take with him to his next visit               Balance  Exercises - 04/24/20 0950      Balance Exercises: Standing   Standing Eyes Closed Wide (BOA);Narrow base of support (BOS);Limitations    Standing Eyes Closed Limitations on air ex, 4 x 20 seconds, with intermittent touching to bars, pt with incr B knee flexion with incr reps    Tandem Stance Eyes open;Intermittent upper extremity support;3 reps;20 secs;Limitations    Tandem Stance Time both sides, performed on blue mat    Rockerboard Anterior/posterior;EO;Limitations    Rockerboard Limitations x15 reps with no UE support working on PF and weight shifting onto toes (with cues for incr forward weight shift), intermittent UE support. then on rockerboard performing 2 x 10 reps mini squats - needing mod verbal/demo cues for proper form and technique    Heel Raises 10 reps;Both;Limitations    Heel Raises Limitations with BUE support, standing on red balance beam, cues to keep legs straight and pushing  toes into the ground, pt with incr difficulty performing with RLE    Sit to Stand Without upper extremity support;Foam/compliant surface;Limitations    Sit to Stand Limitations on red balance beam: x10 reps, cues for slowed and controlled when lowering, pt with decr eccentric control at times             PT Education - 04/24/20 1012    Education Details see self care    Person(s) Educated Patient    Methods Explanation;Handout    Comprehension Verbalized understanding            PT Short Term Goals - 02/14/20 1207      PT SHORT TERM GOAL #1   Title Patient will demo 10 deg of Passive internal rotation of bil hips to reduce excessive external rotation during gait    Baseline Internal rotation PROM: 0 deg R; -5 deg L    Time 4    Period Weeks    Status New    Target Date 03/13/20      PT SHORT TERM GOAL #2   Title Patient will be able to perform heel raises 1 inch off the floor for 5 consecutive reps to improve calf strength to assist with push off    Baseline <1/2 inch- pt has excessive compensation and leans forward    Time 4    Period Weeks    Status New    Target Date 03/13/20             PT Long Term Goals - 04/02/20 2017      PT LONG TERM GOAL #1   Title Patient will be independent with ongoing/advanced HEP. ALL LTGS DUE 05/07/20    Baseline --    Time 5    Period Weeks    Status On-going    Target Date 05/07/20      PT LONG TERM GOAL #2   Title Pt will improve walking distance to at least 1,060' in 6MWT in order to improve walking endurance.    Baseline 988'    Time 5    Period Weeks    Status New      PT LONG TERM GOAL #3   Title Pt will improve FGA score to at least 17/30 in order to decr fall risk.    Baseline 13/30    Time 5    Period Weeks    Status New                 Plan - 04/24/20 1210    Clinical Impression Statement Today's  skilled session continue to focus on balance reactions on compliant surfaces and PF strengthening, with  pt having incr difficulty with PF with RLE. Pt with elevated BP after approx. 20 minutes, but still WFL to participate in therapy. Will continue to progress towards LTGs.    Personal Factors and Comorbidities Age;Time since onset of injury/illness/exacerbation;Past/Current Experience;Comorbidity 2    Examination-Activity Limitations Carry    Examination-Participation Restrictions Cleaning;Yard Work    [employer] Evolving/Moderate complexity    Rehab Potential Good    PT Frequency 2x / week    PT Duration 4 weeks    PT Treatment/Interventions ADLs/Self Care Home Management;Gait training;Stair training;Functional mobility training;Therapeutic activities;Neuromuscular re-education;Balance training;Therapeutic exercise;Patient/family education;Orthotic Fit/Training;Vestibular;Energy conservation    PT Next Visit Plan monitor BP with activity.  fuctional strength/balance with emphasis on activiities that promote push/toe off, needs ankle strengthening, balance on compliant surfaces    PT Home Exercise Plan Access Code: [email or web address]           Patient will benefit from skilled therapeutic intervention in order to improve the following deficits and impairments:  Abnormal gait, Decreased balance, Decreased endurance, Decreased range of motion, Decreased strength, Impaired sensation, Pain, Postural dysfunction, Decreased activity tolerance, Difficulty walking  Visit Diagnosis: Muscle weakness (generalized)  Unsteadiness on feet  Difficulty in walking, not elsewhere classified     Problem List Patient Active Problem List   Diagnosis Date Noted  . Hyperglycemia 03/22/2020  . Gastric ulcer 01/27/2020  . H/O colonoscopy with polypectomy 01/27/2020  . Protein-calorie malnutrition, severe 01/09/2020  . Weight loss 01/07/2020  . Abdominal pain 12/30/2019  . Nausea and vomiting 12/30/2019  . Weight loss, abnormal 12/30/2019  . Change in bowel habits 12/30/2019  . Epidural  abscess 10/15/2019  . Medication monitoring encounter 08/26/2019  . Peripheral neuropathy 08/07/2019  . Alcohol abuse 05/09/2019  . Hyponatremia 05/05/2019  . 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 Fusion of spine of thoracolumbar region 03/12/2019  . Status post thoracic spinal fusion 02/12/2019  . 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 Thoracic spinal stenosis 04/17/2017  . Painful orthopaedic hardware ([place]) 04/17/2017  . 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 ([place]) 04/17/2017  . Low back pain radiating to both legs 01/16/2017  . Esophageal reflux 02/10/2015  . Medicare annual wellness visit, subsequent 02/10/2015  . Personal history of colonic polyps 10/27/2012  . 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  . BPH (benign prostatic hyperplasia) 04/23/2012  . ED (erectile dysfunction) 04/23/2012  . Allergic state 10/25/2011  . Anxiety and depression 10/25/2011  . Asthma   . Diabetes mellitus without complication ([place])   . Hyperlipidemia   . Overweight   . Insomnia   . Fatigue   . Preventative health care   . DDD (degenerative disc disease), lumbosacral   . Thoracic degenerative disc disease   . HTN (hypertension)     Arliss Journey, PT, DPT  04/24/2020, 12:11 PM  [facility] [address] [place], [place], [address] Phone: [phone]   Fax:  [phone]  Name: Luis Brown MRN: [id number] Date of Birth: 1945-10-06

## 2020-04-24 NOTE — Patient Instructions (Signed)
Blood Pressure Record Sheet To take your blood pressure, you will need a blood pressure machine. You can buy a blood pressure machine (blood pressure monitor) at your clinic, drug store, or online. When choosing one, consider:  An automatic monitor that has an arm cuff.  A cuff that wraps snugly around your upper arm. You should be able to fit only one finger between your arm and the cuff.  A device that stores blood pressure reading results.  Do not choose a monitor that measures your blood pressure from your wrist or finger. Follow your health care provider's instructions for how to take your blood pressure. To use this form:  Get one reading in the morning (a.m.) before you take any medicines.  Get one reading in the evening (p.m.) before supper.  Take at least 2 readings with each blood pressure check. This makes sure the results are correct. Wait 1-2 minutes between measurements.  Write down the results in the spaces on this form.  Repeat this once a week, or as told by your health care provider.  Make a follow-up appointment with your health care provider to discuss the results. Blood pressure log Date: _______________________  a.m. _____________________(1st reading) _____________________(2nd reading)  p.m. _____________________(1st reading) _____________________(2nd reading) Date: _______________________  a.m. _____________________(1st reading) _____________________(2nd reading)  p.m. _____________________(1st reading) _____________________(2nd reading) Date: _______________________  a.m. _____________________(1st reading) _____________________(2nd reading)  p.m. _____________________(1st reading) _____________________(2nd reading) Date: _______________________  a.m. _____________________(1st reading) _____________________(2nd reading)  p.m. _____________________(1st reading) _____________________(2nd reading) Date: _______________________  a.m.  _____________________(1st reading) _____________________(2nd reading)  p.m. _____________________(1st reading) _____________________(2nd reading) This information is not intended to replace advice given to you by your health care provider. Make sure you discuss any questions you have with your health care provider. Document Revised: 09/01/2017 Document Reviewed: 07/04/2017 Elsevier Patient Education  2020 [employer]  

## 2020-04-27 ENCOUNTER — Encounter: Payer: Self-pay | Admitting: Medical

## 2020-04-28 ENCOUNTER — Ambulatory Visit (INDEPENDENT_AMBULATORY_CARE_PROVIDER_SITE_OTHER): Payer: Medicare Other | Admitting: Medical

## 2020-04-28 ENCOUNTER — Encounter: Payer: Self-pay | Admitting: Medical

## 2020-04-28 ENCOUNTER — Other Ambulatory Visit: Payer: Self-pay

## 2020-04-28 ENCOUNTER — Other Ambulatory Visit: Payer: Self-pay | Admitting: Family Medicine

## 2020-04-28 ENCOUNTER — Ambulatory Visit: Payer: Medicare Other

## 2020-04-28 ENCOUNTER — Ambulatory Visit: Payer: Medicare Other | Admitting: Physical Therapy

## 2020-04-28 VITALS — BP 143/67 | HR 79 | Resp 18 | Ht 70.0 in | Wt 169.4 lb

## 2020-04-28 DIAGNOSIS — G4700 Insomnia, unspecified: Secondary | ICD-10-CM | POA: Diagnosis not present

## 2020-04-28 DIAGNOSIS — K259 Gastric ulcer, unspecified as acute or chronic, without hemorrhage or perforation: Secondary | ICD-10-CM

## 2020-04-28 DIAGNOSIS — E291 Testicular hypofunction: Secondary | ICD-10-CM | POA: Diagnosis not present

## 2020-04-28 MED ORDER — ZOLPIDEM TARTRATE 5 MG PO TABS
5.0000 mg | ORAL_TABLET | Freq: Every evening | ORAL | 0 refills | Status: DC | PRN
Start: 2020-04-28 — End: 2020-05-04

## 2020-04-28 MED ORDER — TESTOSTERONE CYPIONATE 200 MG/ML IM SOLN
200.0000 mg | Freq: Once | INTRAMUSCULAR | Status: AC
  Administered 2020-04-28: 200 mg via INTRAMUSCULAR

## 2020-04-28 NOTE — Progress Notes (Signed)
Subjective:    [name], male    DOB: [birth year], 74 y.o.   MRN: [id number]  HPI Pt in for follow up.  Pt states he still can't sleep. It is effecting his sleep, his appetite and his mood.   Pt states cough syrup he got over the counter did not help.  Pt states with Lorrin Mais he had bad dreams. Night mares.   He did not respond to hydroxzyzine low dose 10 mg or 20 mg at night.  Pt has hx of ulcers. Pt has upcoming endoscopy oct 19th.  Pt had mri on 04/23/2020. Pt is still on oral antibiotic. 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 Previously seen epidural abscess has resolved.  Decreased marrow edema in the T6 vertebral body consistent with improved osteomyelitis.  Fusion across the T9-10 interspace where the patient previously had discitis and osteomyelitis.  Pt is having sharp pain at time at T9-T10 level.      Review of Systems  Constitutional: Positive for fatigue. Negative for chills and fever.       But not hardly sleeping at all.  Respiratory: Negative for cough, chest tightness, shortness of breath and wheezing.   Cardiovascular: Negative for chest pain and palpitations.  Gastrointestinal: Negative for abdominal pain.  Genitourinary: Negative for dysuria.  Musculoskeletal: Negative for back pain.  Skin: Negative for rash.  Neurological: Negative for seizures, facial asymmetry, speech difficulty, weakness and headaches.  Hematological: Negative for adenopathy. Does not bruise/bleed easily.  Psychiatric/Behavioral: Negative for dysphoric mood and suicidal ideas. The patient is not nervous/anxious.        Stressed about his sons health.    Past Medical History:  Diagnosis Date  . Acute pharyngitis 10/21/2013  . Allergy    grass and pollen  . Anxiety and depression 10/25/2011  . BPH (benign prostatic hyperplasia) 04/23/2012  . Chicken  pox as a child  . DDD (degenerative disc disease)    cervical responds to steroid injections and low back required surgery  . DDD (degenerative disc disease), lumbosacral   . Diabetes mellitus    pre  . Dyspnea   . ED (erectile dysfunction) 04/23/2012  . Elevated BP   . 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 Esophageal reflux 02/10/2015  . Fatigue   . HTN (hypertension)   . Hyperglycemia    preDM   . Hyperlipidemia   . Insomnia   . Low back pain radiating to both legs 01/16/2017  . Measles as a child  . Medicare annual wellness visit, subsequent 02/10/2015  . Overweight(278.02)   . Peripheral neuropathy 08/07/2019  . Personal history of colonic polyps 10/27/2012   Follows with [facility]  . Pneumonia    " walking"  . Preventative health care   . 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  . Wears glasses      Social History   Socioeconomic History  . Marital status: Married    Spouse name: Not on file  . Number of children: Not on file  . Years of education: Not on file  . Highest education level: Not on file  Occupational History  . Not on file  Tobacco Use  . Smoking status: Former Smoker    Packs/day: 1.00    Years: 20.00    Pack years: 20.00    Types: Cigarettes    Quit date: 07/18/1989    Years since quitting: 30.8  . Smokeless tobacco: Never Used  .  Tobacco comment: off and on smoking for the 20 yrs  Vaping Use  . Vaping Use: Never used  Substance and Sexual Activity  . Alcohol use: Yes    Comment: drink or two a day- mixed drink  . Drug use: No  . Sexual activity: Yes    Comment: lives with wife, still working, no dietary restrictions, continues to exercise intermittently  Other Topics Concern  . Not on file  Social History Narrative  . Not on file   Social Determinants of Health   Financial Resource Strain: Low Risk   . Difficulty of Paying Living Expenses: Not hard at all  Food Insecurity:   . Worried About Charity fundraiser in the Last Year: Not on  file  . Ran Out of Food in the Last Year: Not on file  Transportation Needs: No Transportation Needs  . Lack of Transportation (Medical): No  . Lack of Transportation (Non-Medical): No  Physical Activity:   . Days of Exercise per Week: Not on file  . Minutes of Exercise per Session: Not on file  Stress:   . Feeling of Stress : Not on file  Social Connections:   . Frequency of Communication with Friends and Family: Not on file  . Frequency of Social Gatherings with Friends and Family: Not on file  . Attends Religious Services: Not on file  . Active Member of Clubs or Organizations: Not on file  . Attends Archivist Meetings: Not on file  . Marital Status: Not on file  Intimate Partner Violence:   . Fear of Current or Ex-Partner: Not on file  . Emotionally Abused: Not on file  . Physically Abused: Not on file  . Sexually Abused: Not on file    Past Surgical History:  Procedure Laterality Date  . BACK SURGERY  2012 and 1994   Dr Margret Chance, screws and cage in low back  . BIOPSY  01/09/2020   Procedure: BIOPSY;  Surgeon: Otis Brace, MD;  [facility];  Service: Gastroenterology;;  . COLONOSCOPY WITH PROPOFOL N/A 01/09/2020   Procedure: COLONOSCOPY WITH PROPOFOL;  Surgeon: Otis Brace, MD;  [facility];  Service: Gastroenterology;  Laterality: N/A;  . ESOPHAGOGASTRODUODENOSCOPY (EGD) WITH PROPOFOL N/A 01/09/2020   Procedure: ESOPHAGOGASTRODUODENOSCOPY (EGD) WITH PROPOFOL;  Surgeon: Otis Brace, MD;  [facility];  Service: Gastroenterology;  Laterality: N/A;  . EYE SURGERY Bilateral    2016  . HARDWARE REVISION  03/12/2019   REVISION OF HARDWARE THORACIC TEN-THORACIC ELEVEN WITH APPLICATION OF ADDITIONAL RODS AND ROD SLEEVES THORACIC EIGHT-THORACIC TWELVE 12-LUMBAR ONE (N/A )  . LAMINECTOMY  02/12/2019   DECOMPRESSIVE LAMINECTOMY THORACIC NINE-THORACIC TEN AND THORACIC TEN-THORACIC ELEVEN, EXTENSION OF THORACOLUMBAR FUSION FROM  THORACIC TEN TO THORACIC FIVE, LOCAL BONE GRAFT, ALLOGRAFT AND VIVIGEN (N/A)  . POLYPECTOMY  01/09/2020   Procedure: POLYPECTOMY;  Surgeon: Otis Brace, MD;  [facility];  Service: Gastroenterology;;  . SPINAL FUSION N/A 02/12/2019   Procedure: DECOMPRESSIVE LAMINECTOMY THORACIC NINE-THORACIC TEN AND THORACIC TEN-THORACIC ELEVEN, EXTENSION OF THORACOLUMBAR FUSION FROM THORACIC TEN TO THORACIC FIVE, LOCAL BONE GRAFT, ALLOGRAFT AND VIVIGEN;  Surgeon: Jessy Oto, MD;  Location: [place];  Service: Orthopedics;  Laterality: N/A;  DECOMPRESSIVE LAMINECTOMY THORACIC NINE-THORACIC TEN AND THORACIC TEN-THORACIC ELEVEN, EXTENSION OF TH  . SPINAL FUSION N/A 03/12/2019   Procedure: REVISION OF HARDWARE THORACIC TEN-THORACIC ELEVEN WITH APPLICATION OF ADDITIONAL RODS AND ROD SLEEVES THORACIC EIGHT-THORACIC TWELVE 12-LUMBAR ONE;  Surgeon: Jessy Oto, MD;  Location: [place];  Service: Orthopedics;  Laterality: N/A;  . TONSILLECTOMY    . TONSILLECTOMY     as child  . torn rotator cuff  2010   right    Family History  Problem Relation Age of Onset  . Hypertension Mother   . Diabetes Mother        type 2  . Cancer Mother 24       breast in remission  . Emphysema Father        smoker  . COPD Father        smoker  . Stroke Father 84       mini  . Heart disease Father   . Hypertension Sister   . Hyperlipidemia Sister   . Scoliosis Sister   . Osteoporosis Sister   . Hypertension Maternal Grandmother   . Scoliosis Maternal Grandmother   . Heart disease Maternal Grandfather   . Heart disease Paternal Grandfather        smoker  . Heart disease Daughter     No Known Allergies  Current Outpatient Medications on File Prior to Visit  Medication Sig Dispense Refill  . atorvastatin (LIPITOR) 10 MG tablet Take 1 tablet (10 mg total) by mouth daily. 30 tablet 3  . busPIRone (BUSPAR) 7.5 MG tablet 1-2 TAB BY MOUTH 3 TIMES A DAY FOR ANXIETY (Patient taking differently: Take 7.5 mg by  mouth 3 (three) times daily. ) 540 tablet 1  . cetirizine (ZYRTEC) 10 MG tablet Take 10 mg by mouth daily. In the morning    . ferrous fumarate (FERRETTS) 325 (106 Fe) MG TABS tablet TAKE 1 TABLET (106 MG OF IRON TOTAL) BY MOUTH DAILY. 30 tablet 2  . glucose blood (CONTOUR NEXT TEST) test strip Check blood sugar once daily 100 each 12  . HYDROcodone-acetaminophen (NORCO/VICODIN) 5-325 MG tablet Take 1 tablet by mouth every 6 (six) hours as needed for moderate pain. 30 tablet 0  . JANUVIA 50 MG tablet TAKE 1 TABLET BY MOUTH EVERY DAY 30 tablet 3  . lisinopril (ZESTRIL) 5 MG tablet Take 0.5 tablets (2.5 mg total) by mouth daily.    . metFORMIN (GLUCOPHAGE) 1000 MG tablet Take 1 tablet (1,000 mg total) by mouth 2 (two) times daily with a meal. 180 tablet 0  . Multiple Vitamin (MULTIVITAMIN WITH MINERALS) TABS tablet Take 1 tablet by mouth daily.    . pantoprazole (PROTONIX) 40 MG tablet TAKE 1 TABLET BY MOUTH TWICE A DAY BEFORE A MEAL 60 tablet 1  . pregabalin (LYRICA) 75 MG capsule TAKE 1 CAPSULE BY MOUTH TWICE A DAY 60 capsule 3  . tamsulosin (FLOMAX) 0.4 MG CAPS capsule TAKE 2 CAPSULES BY MOUTH EVERY DAY 180 capsule 0  . testosterone cypionate (DEPOTESTOSTERONE CYPIONATE) 200 MG/ML injection Inject 1 mL (200 mg total) into the muscle every 14 (fourteen) days. 2 mL 5  . tiZANidine (ZANAFLEX) 4 MG tablet TAKE 1.5 TABLETS (6 MG TOTAL) BY MOUTH EVERY 6 (SIX) HOURS AS NEEDED FOR MUSCLE SPASMS. 180 tablet 3  . venlafaxine XR (EFFEXOR-XR) 150 MG 24 hr capsule Take 1 capsule (150 mg total) by mouth daily with breakfast. 90 capsule 1  . doxycycline (VIBRA-TABS) 100 MG tablet Take 1 tablet (100 mg total) by mouth 2 (two) times daily. (Patient not taking: Reported on 04/21/2020) 60 tablet 5   No current facility-administered medications on file prior to visit.    BP (!) 143/67   Pulse 79   Resp 18   Ht 5\' 10"  (1.778 m)  Wt 169 lb 6.4 oz (76.8 kg)   SpO2 99%   BMI 24.31 kg/m       Objective:    Physical Exam   General Mental Status- Alert. General Appearance- Not in acute distress.   Skin General: Color- Normal Color. Moisture- Normal Moisture.  Neck Carotid Arteries- Normal color. Moisture- Normal Moisture. No carotid bruits. No JVD.  Chest and Lung Exam Auscultation: Breath Sounds:-Normal.  Cardiovascular Auscultation:Rythm- Regular. Murmurs & Other Heart Sounds:Auscultation of the heart reveals- No Murmurs.  Abdomen Inspection:-Inspeection Normal. Palpation/Percussion:Note:No mass. Palpation and Percussion of the abdomen reveal- Non Tender, Non Distended + BS, no rebound or guarding.    Neurologic Cranial Nerve exam:- CN III-XII intact(No nystagmus), symmetric smile. Strength:- 5/5 equal and symmetric strength both upper and lower extremities.     Assessment & Plan:  Your insomina persists despite recent med rx'd.  Discussion of med options and you want to try ambien as you used before and could sleep years ago. However you rreport bad dreams while on. If this occurs again notify me. Could try lunesta. Hold melatonin presently. DC hydroxyzine.  For low T, you got testosterone injection today.  For hx of ulcer follow up with GI MD for EGD.  Follow up with orthopedist and ID MD to determine how long you should continue oral antibiotic.  Follow up 1 month or as needed. But update me in 3 days or so as to how you are sleeping.

## 2020-04-28 NOTE — Patient Instructions (Addendum)
Your insomina persists despite recent med prescribed..  Discussion of med options and you want to try ambien as you used before and could sleep years ago. However you rreport bad dreams while on. If this occurs again notify me. Could try lunesta. Hold melatonin presently. DC hydroxyzine.  For low T, you got testosterone injection today.  For hx of ulcer follow up with GI MD for EGD.  Follow up with orthopedist and ID MD to determine how long you should continue oral antibiotic.  Follow up 1 month or as needed. But update me in 3 days or so as to how you are sleeping.

## 2020-04-29 ENCOUNTER — Encounter: Payer: Self-pay | Admitting: Medical

## 2020-04-30 DIAGNOSIS — Z1159 Encounter for screening for other viral diseases: Secondary | ICD-10-CM | POA: Diagnosis not present

## 2020-05-01 ENCOUNTER — Ambulatory Visit: Payer: Medicare Other | Admitting: Physical Therapy

## 2020-05-04 ENCOUNTER — Telehealth: Payer: Self-pay | Admitting: Medical

## 2020-05-04 ENCOUNTER — Encounter: Payer: Self-pay | Admitting: Medical

## 2020-05-04 ENCOUNTER — Other Ambulatory Visit: Payer: Self-pay | Admitting: Medical

## 2020-05-04 DIAGNOSIS — M4628 Osteomyelitis of vertebra, sacral and sacrococcygeal region: Secondary | ICD-10-CM | POA: Diagnosis not present

## 2020-05-04 DIAGNOSIS — M150 Primary generalized (osteo)arthritis: Secondary | ICD-10-CM | POA: Diagnosis not present

## 2020-05-04 DIAGNOSIS — M961 Postlaminectomy syndrome, not elsewhere classified: Secondary | ICD-10-CM | POA: Diagnosis not present

## 2020-05-04 DIAGNOSIS — G894 Chronic pain syndrome: Secondary | ICD-10-CM | POA: Diagnosis not present

## 2020-05-04 MED ORDER — ZOLPIDEM TARTRATE 5 MG PO TABS: 5.0000 mg | ORAL_TABLET | Freq: Every evening | ORAL | 0 refills | Status: DC | PRN

## 2020-05-04 NOTE — Telephone Encounter (Signed)
Rx ambien sent to pt pharmacy.

## 2020-05-04 NOTE — Telephone Encounter (Signed)
Patient states if possible can he get a month supply

## 2020-05-05 ENCOUNTER — Ambulatory Visit: Payer: Medicare Other | Admitting: Physical Therapy

## 2020-05-05 DIAGNOSIS — K3189 Other diseases of stomach and duodenum: Secondary | ICD-10-CM | POA: Diagnosis not present

## 2020-05-05 DIAGNOSIS — K2930 Chronic superficial gastritis without bleeding: Secondary | ICD-10-CM | POA: Diagnosis not present

## 2020-05-05 DIAGNOSIS — K259 Gastric ulcer, unspecified as acute or chronic, without hemorrhage or perforation: Secondary | ICD-10-CM | POA: Diagnosis not present

## 2020-05-06 ENCOUNTER — Other Ambulatory Visit: Payer: Self-pay | Admitting: Medical

## 2020-05-07 ENCOUNTER — Ambulatory Visit: Payer: Medicare Other | Admitting: Specialist

## 2020-05-07 ENCOUNTER — Ambulatory Visit: Payer: Medicare Other | Admitting: Physical Therapy

## 2020-05-07 ENCOUNTER — Encounter: Payer: Self-pay | Admitting: Medical

## 2020-05-08 ENCOUNTER — Encounter: Payer: Self-pay | Admitting: Specialist

## 2020-05-08 ENCOUNTER — Other Ambulatory Visit: Payer: Self-pay | Admitting: Family Medicine

## 2020-05-08 ENCOUNTER — Ambulatory Visit: Payer: Medicare Other | Admitting: Specialist

## 2020-05-08 ENCOUNTER — Other Ambulatory Visit: Payer: Self-pay

## 2020-05-08 VITALS — BP 95/60 | HR 80 | Ht 70.0 in | Wt 169.6 lb

## 2020-05-08 DIAGNOSIS — M960 Pseudarthrosis after fusion or arthrodesis: Secondary | ICD-10-CM

## 2020-05-08 DIAGNOSIS — M40294 Other kyphosis, thoracic region: Secondary | ICD-10-CM

## 2020-05-08 DIAGNOSIS — T8484XA Pain due to internal orthopedic prosthetic devices, implants and grafts, initial encounter: Secondary | ICD-10-CM | POA: Diagnosis not present

## 2020-05-08 DIAGNOSIS — Z981 Arthrodesis status: Secondary | ICD-10-CM | POA: Diagnosis not present

## 2020-05-08 NOTE — Patient Instructions (Addendum)
Avoid bending, stooping and avoid lifting weights greater than 10 lbs. Avoid prolong standing and walking. Order for a new walker with wheels. Surgery scheduling secretary Kandice Hams, will call you in the next week to schedule for surgery.  Surgery recommended is a removal of loosened hardware at T5 and T6 this would be done with removal of rods and screws with application of local bone graft and allograft (donor bone graft). Risk of surgery includes risk of infection 1 in 200 patients, bleeding 1/2% chance you would need a transfusion.   Risk to the nerves is one in 10,000.  Expect improved walking and standing tolerance. Expect relief of leg pain but numbness may persist depending on the length and degree of pressure that has been present.

## 2020-05-08 NOTE — Progress Notes (Addendum)
Office Visit Note   Patient: Luis Brown           Date of Birth: 12/03/1945           MRN: [id number] Visit Date: 05/08/2020              Requested by: Mackie Pai, PA-C [address] [place],  [place] [address] PCP: Mackie Pai, PA-C   Assessment & Plan: Visit Diagnoses:  1. Painful orthopaedic hardware ([place])   2. Other kyphosis, thoracic region   3. Status post thoracic spinal fusion   4. Pseudarthrosis after fusion or arthrodesis     Plan: Avoid bending, stooping and avoid lifting weights greater than 10 lbs. Avoid prolong standing and walking. Order for a new walker with wheels. Surgery scheduling secretary Kandice Hams, will call you in the next week to schedule for surgery.  Surgery recommended is a removal of loosened hardware at T5 and T6 this would be done with removal of rods and screws with application of local bone graft and allograft (donor bone graft). Risk of surgery includes risk of infection 1 in 200 patients, bleeding 1/2% chance you would need a transfusion.   Risk to the nerves is one in 10,000.  Expect improved walking and standing tolerance. Expect relief of leg pain but numbness may persist depending on the length and degree of pressure that has been present.   Follow-Up Instructions: Return in about 4 weeks (around 06/05/2020).   Orders:  No orders of the defined types were placed in this encounter.  No orders of the defined types were placed in this encounter.     Procedures: No procedures performed   Clinical Data: No additional findings.   Subjective: Chief Complaint  Patient presents with  . Spine - Follow-up    MRI Review    74 year old male with history of extension of fusion almost 15 months ago with loss of fixation had biopsy of disc at the T7-8 level returned with discitis had revision of hardware. Persistent pain between the shoulder blades. He underwent MRI of the thoracic.    Review of Systems   Constitutional: Negative.   HENT: Negative.   Eyes: Negative.   Respiratory: Negative.   Cardiovascular: Negative.   Gastrointestinal: Negative.   Endocrine: Negative.   Genitourinary: Negative.   Musculoskeletal: Negative.   Skin: Negative.   Allergic/Immunologic: Negative.   Neurological: Negative.   Hematological: Negative.   Psychiatric/Behavioral: Negative.      Objective: Vital Signs: BP 95/60 (BP Location: Left Arm, Patient Position: Sitting)   Pulse 80   Ht 5\' 10"  (1.778 m)   Wt 169 lb 9.6 oz (76.9 kg)   BMI 24.34 kg/m   Physical Exam Constitutional:      Appearance: He is well-developed.  HENT:     Head: Normocephalic and atraumatic.  Eyes:     Pupils: Pupils are equal, round, and reactive to light.  Pulmonary:     Effort: Pulmonary effort is normal.     Breath sounds: Normal breath sounds.  Abdominal:     General: Bowel sounds are normal.     Palpations: Abdomen is soft.  Musculoskeletal:     Cervical back: Normal range of motion and neck supple.     Lumbar back: Negative right straight leg raise test and negative left straight leg raise test.  Skin:    General: Skin is warm and dry.  Neurological:     Mental Status: He is  alert and oriented to person, place, and time.  Psychiatric:        Behavior: Behavior normal.        Thought Content: Thought content normal.        Judgment: Judgment normal.     Back Exam   Tenderness  The patient is experiencing tenderness in the lumbar.  Range of Motion  Extension: abnormal  Flexion: abnormal  Lateral bend right: normal  Lateral bend left: normal  Rotation right: normal  Rotation left: normal   Muscle Strength  Right Quadriceps:  5/5  Left Quadriceps:  5/5  Right Hamstrings:  5/5  Left Hamstrings:  5/5   Tests  Straight leg raise right: negative Straight leg raise left: negative  Reflexes  Patellar: 2/4 Achilles: 2/4 Babinski's sign: normal   Other  Toe walk: normal Heel walk:  normal Sensation: decreased Gait: normal  Erythema: no back redness Scars: absent      Specialty Comments:  No specialty comments available.  Imaging: No results found.   PMFS History: Patient Active Problem List   Diagnosis Date Noted  . Hyperglycemia 03/22/2020  . Gastric ulcer 01/27/2020  . H/O colonoscopy with polypectomy 01/27/2020  . Protein-calorie malnutrition, severe 01/09/2020  . Weight loss 01/07/2020  . Abdominal pain 12/30/2019  . Nausea and vomiting 12/30/2019  . Weight loss, abnormal 12/30/2019  . Change in bowel habits 12/30/2019  . 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 Medication monitoring encounter 08/26/2019  . Peripheral neuropathy 08/07/2019  . Alcohol abuse 05/09/2019  . Hyponatremia 05/05/2019  . 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 Fusion of spine of thoracolumbar region 03/12/2019  . Status post thoracic spinal fusion 02/12/2019  . 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 Thoracic spinal stenosis 04/17/2017  . Painful orthopaedic hardware ([place]) 04/17/2017  . 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 ([place]) 04/17/2017  . Low back pain radiating to both legs 01/16/2017  . Esophageal reflux 02/10/2015  . Medicare annual wellness visit, subsequent 02/10/2015  . Personal history of colonic polyps 10/27/2012  . 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  . BPH (benign prostatic hyperplasia) 04/23/2012  . ED (erectile dysfunction) 04/23/2012  . Allergic state 10/25/2011  . Anxiety and depression 10/25/2011  . Asthma   . Diabetes mellitus without complication ([place])   . Hyperlipidemia   . Overweight   . Insomnia   . Fatigue   . Preventative health care   . DDD (degenerative disc disease), lumbosacral   . Thoracic degenerative disc disease   . HTN (hypertension)    Past Medical History:  Diagnosis Date  . Acute pharyngitis 10/21/2013  . Allergy     grass and pollen  . Anxiety and depression 10/25/2011  . BPH (benign prostatic hyperplasia) 04/23/2012  . Chicken pox as a child  . DDD (degenerative disc disease)    cervical responds to steroid injections and low back required surgery  . DDD (degenerative disc disease), lumbosacral   . Diabetes mellitus    pre  . Dyspnea   . ED (erectile dysfunction) 04/23/2012  . Elevated BP   . 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 Esophageal reflux 02/10/2015  . Fatigue   . HTN (hypertension)   . Hyperglycemia    preDM   . Hyperlipidemia   . Insomnia   . Low back pain radiating to both legs 01/16/2017  . Measles as a child  . Medicare annual wellness visit, subsequent 02/10/2015  . Overweight(278.02)   .  Peripheral neuropathy 08/07/2019  . Personal history of colonic polyps 10/27/2012   Follows with [facility]  . Pneumonia    " walking"  . Preventative health care   . 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  . Wears glasses     Family History  Problem Relation Age of Onset  . Hypertension Mother   . Diabetes Mother        type 2  . Cancer Mother 34       breast in remission  . Emphysema Father        smoker  . COPD Father        smoker  . Stroke Father 47       mini  . Heart disease Father   . Hypertension Sister   . Hyperlipidemia Sister   . Scoliosis Sister   . Osteoporosis Sister   . Hypertension Maternal Grandmother   . Scoliosis Maternal Grandmother   . Heart disease Maternal Grandfather   . Heart disease Paternal Grandfather        smoker  . Heart disease Daughter     Past Surgical History:  Procedure Laterality Date  . BACK SURGERY  2012 and 1994   Dr Margret Chance, screws and cage in low back  . BIOPSY  01/09/2020   Procedure: BIOPSY;  Surgeon: Otis Brace, MD;  [facility];  Service: Gastroenterology;;  . COLONOSCOPY WITH PROPOFOL N/A 01/09/2020   Procedure: COLONOSCOPY WITH PROPOFOL;  Surgeon: Otis Brace, MD;  [facility];  Service:  Gastroenterology;  Laterality: N/A;  . ESOPHAGOGASTRODUODENOSCOPY (EGD) WITH PROPOFOL N/A 01/09/2020   Procedure: ESOPHAGOGASTRODUODENOSCOPY (EGD) WITH PROPOFOL;  Surgeon: Otis Brace, MD;  [facility];  Service: Gastroenterology;  Laterality: N/A;  . EYE SURGERY Bilateral    2016  . HARDWARE REVISION  03/12/2019   REVISION OF HARDWARE THORACIC TEN-THORACIC ELEVEN WITH APPLICATION OF ADDITIONAL RODS AND ROD SLEEVES THORACIC EIGHT-THORACIC TWELVE 12-LUMBAR ONE (N/A )  . LAMINECTOMY  02/12/2019   DECOMPRESSIVE LAMINECTOMY THORACIC NINE-THORACIC TEN AND THORACIC TEN-THORACIC ELEVEN, EXTENSION OF THORACOLUMBAR FUSION FROM THORACIC TEN TO THORACIC FIVE, LOCAL BONE GRAFT, ALLOGRAFT AND VIVIGEN (N/A)  . POLYPECTOMY  01/09/2020   Procedure: POLYPECTOMY;  Surgeon: Otis Brace, MD;  [facility];  Service: Gastroenterology;;  . SPINAL FUSION N/A 02/12/2019   Procedure: DECOMPRESSIVE LAMINECTOMY THORACIC NINE-THORACIC TEN AND THORACIC TEN-THORACIC ELEVEN, EXTENSION OF THORACOLUMBAR FUSION FROM THORACIC TEN TO THORACIC FIVE, LOCAL BONE GRAFT, ALLOGRAFT AND VIVIGEN;  Surgeon: Jessy Oto, MD;  Location: [place];  Service: Orthopedics;  Laterality: N/A;  DECOMPRESSIVE LAMINECTOMY THORACIC NINE-THORACIC TEN AND THORACIC TEN-THORACIC ELEVEN, EXTENSION OF TH  . SPINAL FUSION N/A 03/12/2019   Procedure: REVISION OF HARDWARE THORACIC TEN-THORACIC ELEVEN WITH APPLICATION OF ADDITIONAL RODS AND ROD SLEEVES THORACIC EIGHT-THORACIC TWELVE 12-LUMBAR ONE;  Surgeon: Jessy Oto, MD;  Location: [place];  Service: Orthopedics;  Laterality: N/A;  . TONSILLECTOMY    . TONSILLECTOMY     as child  . torn rotator cuff  2010   right   Social History   Occupational History  . Not on file  Tobacco Use  . Smoking status: Former Smoker    Packs/day: 1.00    Years: 20.00    Pack years: 20.00    Types: Cigarettes    Quit date: 07/18/1989    Years since quitting: 30.9  . Smokeless tobacco: Never  Used  . Tobacco comment: off and on smoking for the 20 yrs  Vaping Use  . Vaping Use: Never used  Substance and Sexual Activity  . Alcohol use: Yes    Comment: drink or two a day- mixed drink  . Drug use: No  . Sexual activity: Yes    Comment: lives with wife, still working, no dietary restrictions, continues to exercise intermittently

## 2020-05-11 DIAGNOSIS — K2930 Chronic superficial gastritis without bleeding: Secondary | ICD-10-CM | POA: Diagnosis not present

## 2020-05-11 NOTE — Telephone Encounter (Signed)
Requesting: Sildenafil Contract:01/25/18 UDS: Last Visit:04/28/20 Next Visit:06/29/20 Last Refill:12/17/2018  Please Advise

## 2020-05-12 ENCOUNTER — Telehealth: Payer: Self-pay | Admitting: Medical

## 2020-05-12 ENCOUNTER — Ambulatory Visit: Payer: Medicare Other | Admitting: Physical Therapy

## 2020-05-12 MED ORDER — ZOLPIDEM TARTRATE 5 MG PO TABS: 5.0000 mg | ORAL_TABLET | Freq: Every evening | ORAL | 0 refills | Status: DC | PRN

## 2020-05-12 NOTE — Telephone Encounter (Signed)
Rx ambien sent to pt pharmacy. Lyrica rx by orthopedist still active. He should have refills of lyrica. Follow up in one month.

## 2020-05-12 NOTE — Telephone Encounter (Signed)
Medication: zolpidem (AMBIEN) 5 MG tablet [[id number]]   pregabalin (LYRICA) 75 MG capsule [[id number]]   Has the patient contacted their pharmacy?  (If no, request that the patient contact the pharmacy for the refill.) (If yes, when and what did the pharmacy advise?)     Preferred Pharmacy (with phone number or street name): [id number] - [place], [place]  [address] [address]  Phone:  [phone] Fax:  [phone]      Agent: Please be advised that RX refills may take up to 3 business days. We ask that you follow-up with your pharmacy.

## 2020-05-14 ENCOUNTER — Other Ambulatory Visit (HOSPITAL_BASED_OUTPATIENT_CLINIC_OR_DEPARTMENT_OTHER): Payer: Self-pay

## 2020-05-14 ENCOUNTER — Ambulatory Visit (INDEPENDENT_AMBULATORY_CARE_PROVIDER_SITE_OTHER): Payer: Medicare Other | Admitting: *Deleted

## 2020-05-14 ENCOUNTER — Other Ambulatory Visit: Payer: Self-pay

## 2020-05-14 DIAGNOSIS — R0683 Snoring: Secondary | ICD-10-CM

## 2020-05-14 DIAGNOSIS — E291 Testicular hypofunction: Secondary | ICD-10-CM

## 2020-05-14 DIAGNOSIS — R5383 Other fatigue: Secondary | ICD-10-CM

## 2020-05-14 DIAGNOSIS — G4710 Hypersomnia, unspecified: Secondary | ICD-10-CM

## 2020-05-14 MED ORDER — TESTOSTERONE CYPIONATE 200 MG/ML IM SOLN
200.0000 mg | Freq: Once | INTRAMUSCULAR | Status: AC
  Administered 2020-05-14: 200 mg via INTRAMUSCULAR

## 2020-05-14 NOTE — Progress Notes (Addendum)
Patient here for testosterone injection per Percell Miller.  Injection given left upper outer quadrant and patient tolerated well.  Agree with administration.  Mackie Pai, PA-C

## 2020-05-15 ENCOUNTER — Ambulatory Visit: Payer: Medicare Other | Admitting: Physical Therapy

## 2020-05-15 VITALS — BP 162/85 | HR 90

## 2020-05-15 DIAGNOSIS — M6281 Muscle weakness (generalized): Secondary | ICD-10-CM

## 2020-05-15 DIAGNOSIS — R2681 Unsteadiness on feet: Secondary | ICD-10-CM | POA: Diagnosis not present

## 2020-05-15 DIAGNOSIS — R262 Difficulty in walking, not elsewhere classified: Secondary | ICD-10-CM

## 2020-05-15 NOTE — Therapy (Signed)
[facility] [address] [place], [place], [address] Phone: [phone]   Fax:  [phone]  Physical Therapy Treatment/Discharge Summary  Patient Details  Name: Luis Brown MRN: [id number] Date of Birth: [birth year] Referring Provider (PT): Dr. Penni Homans    Encounter Date: 05/15/2020   PT End of Session - 05/15/20 1217    Visit Number 11    Number of Visits 14    Date for PT Re-Evaluation 06/01/20   written for 30 day POC   Authorization Type [employer] [place] [place]- needs 10th visit progress note    Progress Note Due on Visit 10    PT Start Time 1016    PT Stop Time 1059    PT Time Calculation (min) 43 min    Equipment Utilized During Treatment Gait belt    Activity Tolerance Patient tolerated treatment well    Behavior During Therapy [facility] for tasks assessed/performed   pt needing to be redirected to the task at hand at times          Past Medical History:  Diagnosis Date  . Acute pharyngitis 10/21/2013  . Allergy    grass and pollen  . Anxiety and depression 10/25/2011  . BPH (benign prostatic hyperplasia) 04/23/2012  . Chicken pox as a child  . DDD (degenerative disc disease)    cervical responds to steroid injections and low back required surgery  . DDD (degenerative disc disease), lumbosacral   . Diabetes mellitus    pre  . Dyspnea   . ED (erectile dysfunction) 04/23/2012  . Elevated BP   . 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 Esophageal reflux 02/10/2015  . Fatigue   . HTN (hypertension)   . Hyperglycemia    preDM   . Hyperlipidemia   . Insomnia   . Low back pain radiating to both legs 01/16/2017  . Measles as a child  . Medicare annual wellness visit, subsequent 02/10/2015  . Overweight(278.02)   . Peripheral neuropathy 08/07/2019  . Personal history of colonic polyps 10/27/2012   Follows with [facility]  . Pneumonia    " walking"  . Preventative health care   . 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  .  Wears glasses     Past Surgical History:  Procedure Laterality Date  . BACK SURGERY  2012 and 1994   Dr Margret Chance, screws and cage in low back  . BIOPSY  01/09/2020   Procedure: BIOPSY;  Surgeon: Otis Brace, MD;  Location: [facility];  Service: Gastroenterology;;  . COLONOSCOPY WITH PROPOFOL N/A 01/09/2020   Procedure: COLONOSCOPY WITH PROPOFOL;  Surgeon: Otis Brace, MD;  Location: [facility];  Service: Gastroenterology;  Laterality: N/A;  . ESOPHAGOGASTRODUODENOSCOPY (EGD) WITH PROPOFOL N/A 01/09/2020   Procedure: ESOPHAGOGASTRODUODENOSCOPY (EGD) WITH PROPOFOL;  Surgeon: Otis Brace, MD;  Location: [facility];  Service: Gastroenterology;  Laterality: N/A;  . EYE SURGERY Bilateral    2016  . HARDWARE REVISION  03/12/2019   REVISION OF HARDWARE THORACIC TEN-THORACIC ELEVEN WITH APPLICATION OF ADDITIONAL RODS AND ROD SLEEVES THORACIC EIGHT-THORACIC TWELVE 12-LUMBAR ONE (N/A )  . LAMINECTOMY  02/12/2019   DECOMPRESSIVE LAMINECTOMY THORACIC NINE-THORACIC TEN AND THORACIC TEN-THORACIC ELEVEN, EXTENSION OF THORACOLUMBAR FUSION FROM THORACIC TEN TO THORACIC FIVE, LOCAL BONE GRAFT, ALLOGRAFT AND VIVIGEN (N/A)  . POLYPECTOMY  01/09/2020   Procedure: POLYPECTOMY;  Surgeon: Otis Brace, MD;  Location: [facility];  Service: Gastroenterology;;  . SPINAL FUSION N/A 02/12/2019   Procedure: DECOMPRESSIVE LAMINECTOMY THORACIC NINE-THORACIC TEN AND THORACIC TEN-THORACIC ELEVEN, EXTENSION OF THORACOLUMBAR  FUSION FROM THORACIC TEN TO THORACIC FIVE, LOCAL BONE GRAFT, ALLOGRAFT AND VIVIGEN;  Surgeon: Jessy Oto, MD;  Location: [facility];  Service: Orthopedics;  Laterality: N/A;  DECOMPRESSIVE LAMINECTOMY THORACIC NINE-THORACIC TEN AND THORACIC TEN-THORACIC ELEVEN, EXTENSION OF TH  . SPINAL FUSION N/A 03/12/2019   Procedure: REVISION OF HARDWARE THORACIC TEN-THORACIC ELEVEN WITH APPLICATION OF ADDITIONAL RODS AND ROD SLEEVES THORACIC EIGHT-THORACIC TWELVE 12-LUMBAR ONE;  Surgeon: Jessy Oto, MD;  Location: [facility];  Service: Orthopedics;  Laterality: N/A;  . TONSILLECTOMY    . TONSILLECTOMY     as child  . torn rotator cuff  2010   right    Vitals:   05/15/20 1027  BP: (!) 162/85  Pulse: 90     Subjective Assessment - 05/15/20 1023    Subjective Had a death in the family. Saw Dr. Louanne Skye recently - will need to get another surgery to have a screw removed, but it would be a simple outpatient surgery or a 1 day stay in the hospital.    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 Decreased conspicuity of ventralepidural abscess, epidural phlegmon and spinal canal narrowing atthe T4-5 level. T4-5 cord edema, unchanged. 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 Partially imaged posterior spinal fusion hardware. Please note lackof intravenous contrast and susceptibility artifact limitsevaluation. These results will be called to the ordering clinician orrepresentative by the Radiologist Assistant, and communicationdocumented in the PACS or [employer]. AP and lateral thoracic spine demonstrates obliteration of the T4-5 disc space, there is more separation of the T 5 pedicle screws within the vertebral body of T5 suggesting increased loosening of the screws at this level. IMPRESSION:1. Fracture and loosening of both T5 pedicle screws which traversethe T5 superior endplate. 2. Loosening of the right T9 pedicle screw which traverses the rightcostovertebral junction. Question subtle loosening also of the otherintervening right thoracic pedicle screws T6 through T8. 3. Sequelae of discitis osteomyelitis suspected at T9-T10 withdeveloping interbody ankylosis. Severe bilateral T9 osseous neuralforaminal stenosis. Moderate to severe bilateral T10 osseousforaminal stenosis. 4. T11 through visible upper lumbar pedicle screws are intactwithout loosening. There is developing right side posterior  elementankylosis at T11-T12, and developing bilateral posterior elementankylosis at T12-L1.    Patient Stated Goals "get stronger", some weight gain/muscle gain, wants to continue to work on walking    Currently in Pain? Yes    Pain Score 3     Pain Location Back    Pain Orientation Upper;Mid    Pain Descriptors / Indicators --   feels like it is pressing   Pain Type Chronic pain    Pain Onset More than a month ago              [facility] PT Assessment - 05/15/20 1027      Functional Gait  Assessment   Gait assessed  Yes    Gait Level Surface Walks 20 ft, slow speed, abnormal gait pattern, evidence for imbalance or deviates 10-15 in outside of the 12 in walkway width. Requires more than 7 sec to ambulate 20 ft.   7.68   Change in Gait Speed Able to change speed, demonstrates mild gait deviations, deviates 6-10 in outside of the 12 in walkway width, or no gait deviations, unable to achieve a major change in velocity, or uses a change in velocity, or uses an assistive device.    Gait with Horizontal Head Turns Performs head turns smoothly with  no change in gait. Deviates no more than 6 in outside 12 in walkway width    Gait with Vertical Head Turns Performs head turns with no change in gait. Deviates no more than 6 in outside 12 in walkway width.    Gait and Pivot Turn Pivot turns safely within 3 sec and stops quickly with no loss of balance.    Step Over Obstacle Is able to step over one shoe box (4.5 in total height) without changing gait speed. No evidence of imbalance.    Gait with Narrow Base of Support Ambulates less than 4 steps heel to toe or cannot perform without assistance.    Gait with Eyes Closed Cannot walk 20 ft without assistance, severe gait deviations or imbalance, deviates greater than 15 in outside 12 in walkway width or will not attempt task.    Ambulating Backwards Walks 20 ft, slow speed, abnormal gait pattern, evidence for imbalance, deviates 10-15 in outside 12 in walkway  width.    Steps Alternating feet, must use rail.    Total Score 17    FGA comment: 17/30               Access Code: [email or web address] URL: [email or web address] Date: 05/15/2020 Prepared by: Janann August  Reviewed HEP: verbal and demo cues for proper technique for squats/lunges. See MedBridge for more details.   Exercises Standing Soleus Stretch on Step - 1 x daily - 5 x weekly - 3 reps - 20 hold Gastroc Stretch on Wall - 1 x daily - 5 x weekly - 2 sets - 30 hold Seated Ankle Plantarflexion with Resistance - 1 x daily - 5 x weekly - 2 sets - 10 reps Heel Toe Raises with Counter Support - 1 x daily - 5 x weekly - 1 sets - 10 reps - 5 hold Squat with Counter Support - 1 x daily - 5 x weekly - 1 sets - 10 reps Side Lunge with Counter Support - 1 x daily - 5 x weekly - 1 sets - 10 reps Forward Lunge with Counter Support - 1 x daily - 5 x weekly - 1 sets - 10 reps Wide Stance with Eyes Closed on Foam Pad - 1 x daily - 5 x weekly - 1 sets - 3 reps - 30 hold Wide Stance with Head Rotation on Foam Pad - 1 x daily - 5 x weekly - 1 sets - 10 reps            [facility] Adult PT Treatment/Exercise - 05/15/20 0001      Therapeutic Activites    Therapeutic Activities Other Therapeutic Activities    Other Therapeutic Activities pt with a recent death in the family and would like to be done with therapy at this time. would like to return again in the future when he is ready - PT educating pt that he would need to have a new referral from his physician                   PT Education - 05/15/20 1100    Education Details progress towards goals, reviewed HEP, needing to get a new referral from MD to return to therapy.    Person(s) Educated Patient    Methods Explanation;Demonstration;Handout;Verbal cues    Comprehension Verbalized understanding;Returned demonstration            PT Short Term Goals - 02/14/20 1207      PT SHORT TERM GOAL #1   Title Patient will  demo  10 deg of Passive internal rotation of bil hips to reduce excessive external rotation during gait    Baseline Internal rotation PROM: 0 deg R; -5 deg L    Time 4    Period Weeks    Status New    Target Date 03/13/20      PT SHORT TERM GOAL #2   Title Patient will be able to perform heel raises 1 inch off the floor for 5 consecutive reps to improve calf strength to assist with push off    Baseline <1/2 inch- pt has excessive compensation and leans forward    Time 4    Period Weeks    Status New    Target Date 03/13/20             PT Long Term Goals - 05/15/20 1036      PT LONG TERM GOAL #1   Title Patient will be independent with ongoing/advanced HEP. ALL LTGS DUE 05/07/20    Baseline had to review exercises as pt had not been consistently performing all at home    Time 5    Period Weeks    Status Partially Met      PT LONG TERM GOAL #2   Title Pt will improve walking distance to at least 1,060' in 6MWT in order to improve walking endurance.    Baseline 20' - unable to assess on 05/15/20 due to time constraints    Time 5    Period Weeks    Status Deferred      PT LONG TERM GOAL #3   Title Pt will improve FGA score to at least 17/30 in order to decr fall risk.    Baseline 13/30, 17/30 on 05/15/20    Time 5    Period Weeks    Status Achieved              PHYSICAL THERAPY DISCHARGE SUMMARY  Visits from Start of Care: 11  Current functional level related to goals / functional outcomes: See LTGs.   Remaining deficits: Impaired balance, gait abnormalities, pain in thoracic spine, decr BLE strength.   Education / Equipment: HEP  Plan: Patient agrees to discharge.  Patient goals were partially met. Patient is being discharged due to meeting the stated rehab goals.  ?????            Plan - 05/15/20 1224    Clinical Impression Statement Focus of today's skilled session was assessing pt's LTGs. Pt would like to be discharged at this time due to having a  recent death in the family. Unable to assess LTG #2 due to time constraints. Pt achieved LTG #3 - improved FGA score to a 17/30 (was previously 13/30). Remainder of session focused on reviewing HEP with pt needing verbal and demo cues for technique for standing strength. Pt in agreement to D/C at this time.    Personal Factors and Comorbidities Age;Time since onset of injury/illness/exacerbation;Past/Current Experience;Comorbidity 2    Examination-Activity Limitations Carry    Examination-Participation Restrictions Cleaning;Yard Work    [employer] Evolving/Moderate complexity    Rehab Potential Good    PT Frequency 2x / week    PT Duration 4 weeks    PT Treatment/Interventions ADLs/Self Care Home Management;Gait training;Stair training;Functional mobility training;Therapeutic activities;Neuromuscular re-education;Balance training;Therapeutic exercise;Patient/family education;Orthotic Fit/Training;Vestibular;Energy conservation    PT Next Visit Plan D/C    PT Home Exercise Plan Access Code: [email or web address]           Patient will  benefit from skilled therapeutic intervention in order to improve the following deficits and impairments:  Abnormal gait, Decreased balance, Decreased endurance, Decreased range of motion, Decreased strength, Impaired sensation, Pain, Postural dysfunction, Decreased activity tolerance, Difficulty walking  Visit Diagnosis: Muscle weakness (generalized)  Unsteadiness on feet  Difficulty in walking, not elsewhere classified     Problem List Patient Active Problem List   Diagnosis Date Noted  . Hyperglycemia 03/22/2020  . Gastric ulcer 01/27/2020  . H/O colonoscopy with polypectomy 01/27/2020  . Protein-calorie malnutrition, severe 01/09/2020  . Weight loss 01/07/2020  . Abdominal pain 12/30/2019  . Nausea and vomiting 12/30/2019  . Weight loss, abnormal 12/30/2019  . Change in bowel habits 12/30/2019  . 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  Medication monitoring encounter 08/26/2019  . Peripheral neuropathy 08/07/2019  . Alcohol abuse 05/09/2019  . Hyponatremia 05/05/2019  . 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[place]) 04/04/2019  . Fusion of spine of thoracolumbar region 03/12/2019  . Status post thoracic spinal fusion 02/12/2019  . 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 Thoracic spinal stenosis 04/17/2017  . Painful orthopaedic hardware ([place]) 04/17/2017  . 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 ([place]) 04/17/2017  . Low back pain radiating to both legs 01/16/2017  . Esophageal reflux 02/10/2015  . Medicare annual wellness visit, subsequent 02/10/2015  . Personal history of colonic polyps 10/27/2012  . 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  . BPH (benign prostatic hyperplasia) 04/23/2012  . ED (erectile dysfunction) 04/23/2012  . Allergic state 10/25/2011  . Anxiety and depression 10/25/2011  . Asthma   . Diabetes mellitus without complication ([place])   . Hyperlipidemia   . Overweight   . Insomnia   . Fatigue   . Preventative health care   . DDD (degenerative disc disease), lumbosacral   . Thoracic degenerative disc disease   . HTN (hypertension)     Arliss Journey, PT, DPT  05/15/2020, 12:31 PM  [facility] [address] [place], [place], [address] Phone: [phone]   Fax:  [phone]  Name: Luis Brown MRN: [id number] Date of Birth: January 21, 1946

## 2020-05-15 NOTE — Patient Instructions (Signed)
Access Code: [email or web address] URL: [email or web address] Date: 05/15/2020 Prepared by: Janann August  Exercises Standing Soleus Stretch on Step - 1 x daily - 5 x weekly - 3 reps - 20 hold Gastroc Stretch on Wall - 1 x daily - 5 x weekly - 2 sets - 30 hold Seated Ankle Plantarflexion with Resistance - 1 x daily - 5 x weekly - 2 sets - 10 reps Heel Toe Raises with Counter Support - 1 x daily - 5 x weekly - 1 sets - 10 reps - 5 hold Squat with Counter Support - 1 x daily - 5 x weekly - 1 sets - 10 reps Side Lunge with Counter Support - 1 x daily - 5 x weekly - 1 sets - 10 reps Forward Lunge with Counter Support - 1 x daily - 5 x weekly - 1 sets - 10 reps Wide Stance with Eyes Closed on Foam Pad - 1 x daily - 5 x weekly - 1 sets - 3 reps - 30 hold Wide Stance with Head Rotation on Foam Pad - 1 x daily - 5 x weekly - 1 sets - 10 reps

## 2020-05-17 ENCOUNTER — Other Ambulatory Visit: Payer: Self-pay | Admitting: Medical

## 2020-05-20 ENCOUNTER — Telehealth: Payer: Self-pay

## 2020-05-20 NOTE — Telephone Encounter (Signed)
Pt called stating he needs refill on ferrous fumarate (Ferretts).  He stated he is unsure for the refill is he is to be taking 1 or 2 tabs per day.  Pharmacy is [facility].

## 2020-05-21 MED ORDER — FERRETTS 325 (106 FE) MG PO TABS
ORAL_TABLET | ORAL | 2 refills | Status: DC
Start: 2020-05-21 — End: 2020-10-19

## 2020-05-21 NOTE — Telephone Encounter (Signed)
Med sent.

## 2020-05-22 ENCOUNTER — Encounter: Payer: Self-pay | Admitting: Specialist

## 2020-05-22 ENCOUNTER — Other Ambulatory Visit: Payer: Self-pay | Admitting: Medical

## 2020-05-22 NOTE — Telephone Encounter (Addendum)
Last written:  05/12/20 Last ov: 04/28/20 Next ov: 06/29/20 Contract: 01/25/18 UDS:01/25/18  Advise sent rx refill Lorrin Mais to his pharmacy. Want him to sign contract and give uds.   Please set him up to that.

## 2020-05-26 ENCOUNTER — Ambulatory Visit: Payer: Medicare Other | Admitting: Infectious Disease

## 2020-05-26 ENCOUNTER — Encounter: Payer: Self-pay | Admitting: Infectious Disease

## 2020-05-26 ENCOUNTER — Encounter: Payer: Self-pay | Admitting: Medical

## 2020-05-26 ENCOUNTER — Other Ambulatory Visit: Payer: Self-pay

## 2020-05-26 VITALS — BP 167/77 | HR 77 | Wt 176.0 lb

## 2020-05-26 DIAGNOSIS — T84498A Other mechanical complication of other internal orthopedic devices, implants and grafts, initial encounter: Secondary | ICD-10-CM

## 2020-05-26 DIAGNOSIS — T8484XA Pain due to internal orthopedic prosthetic devices, implants and grafts, initial encounter: Secondary | ICD-10-CM | POA: Diagnosis not present

## 2020-05-26 DIAGNOSIS — R634 Abnormal weight loss: Secondary | ICD-10-CM

## 2020-05-26 DIAGNOSIS — M4804 Spinal stenosis, thoracic region: Secondary | ICD-10-CM | POA: Diagnosis not present

## 2020-05-26 DIAGNOSIS — E119 Type 2 diabetes mellitus without complications: Secondary | ICD-10-CM

## 2020-05-26 DIAGNOSIS — Z981 Arthrodesis status: Secondary | ICD-10-CM

## 2020-05-26 DIAGNOSIS — M960 Pseudarthrosis after fusion or arthrodesis: Secondary | ICD-10-CM

## 2020-05-26 DIAGNOSIS — M4624 Osteomyelitis of vertebra, thoracic region: Secondary | ICD-10-CM | POA: Diagnosis not present

## 2020-05-26 DIAGNOSIS — K259 Gastric ulcer, unspecified as acute or chronic, without hemorrhage or perforation: Secondary | ICD-10-CM

## 2020-05-26 DIAGNOSIS — M48062 Spinal stenosis, lumbar region with neurogenic claudication: Secondary | ICD-10-CM

## 2020-05-26 NOTE — Progress Notes (Signed)
Subjective:  Chief complaint: Mid back pain t [name], male    DOB: 1945/09/09, 74 y.o.   MRN: [id number]  HPI  44 -year-old man with a history of degenerative disc disease and multiple spinal fusions who was initially seen by Dr. Prince Rome in our group.  He has a rather complex surgical history which in regards to his back began in 2015 Dr. Agapito Games performed an T8-L1 fusion to assist with left leg radicular pain and numbness.  Despite multiple visits to see his surgeon at [facility] postoperatively, the patient states that his left leg numbness and direct low thoracic back pain returned within months after his surgery.  Imaging in 2018 showed that he had an L5-S1 rod breakage and loosening of the pedicle screws at that level.  He sought a second opinion with Dr. Louanne Skye late in 2018.  In November 2018, he had a replacement of the L5-S1 fusion segment and bone grafting was performed simultaneously.  Per the operative note reviewed, the broken rod was left intact at that time.  He reports several partial or near falls following this procedure, but he denies any total falls or direct trauma to his back in the past several years.  As his back pain and left leg radicular symptoms gradually returned, further imaging was performed that suggested screw loosening at the T9-T11 section of his spinal fusion.  On February 12, 2019 he returned to the OR with Dr. Louanne Skye and was found to have screw loosening at the same levels, so his fusion was extended superiorly to T5 and bone grafting was performed.  Initially, the patient had a good postoperative course, but over weeks 2 and 3 postoperatively, he began developing pain in his back at his operative site, prompting plain films to be obtained.  X-rays showed a loss of fixation of the opposite left internal rod fastener at the site of his severe back pain and the development of acute loosening of the interval rod fasteners and loss of fixation.  On March 12, 2019, he was taken back to the operating room where it was noted that his Harrington rod had loosened and nearly entirely migrated out of position.  A culture was obtained near the pedicle screw at T10 as a small amount of soft tissue with significant bone osteomalacia was observed.  Given concern for his spinal stability, his recently placed Harrington rods were removed and revised/replaced.  Operative cultures were followed and remained negative at the time of his hospital discharge on March 14, 2019.  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    He was then placed on ceftriaxone in October 2020 with improvement in his symptoms and his inflammatory markers.  He completed his the ceftriaxone and changed over to doxycycline.  He saw Dr. Linus Salmons in February and seemed to be doing relatively well.  Since then he did have some episodes of feeling poorly and feeling flushed and feeling like "something not right I think I am sick".   Dr. 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 Extensive ventral and dorsal epidural thickening and enhancement around the cord from T4 there approximately T6 compatible with epidural infection. And probable osteomyelitis of T6.  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  Dr. Louanne Skye called me arrange for  the patient be seen in clinic   On exam he appeared stable and does not have any new neurological deficits.  Dr. Louanne Skye asked radiology to aspirate the disc for culture but they were unable to do so.  We obtained blood cultures and clinic and these were with no out any growth.   We r initiated him on ceftriaxone on April 6.  He was tolerating the antibiotics foot relatively well though he complains of some dark stools that he thinks might  be due to the antibiotics.  He has continued ceftriaxone and then changed over to doxycycline.  MRI of the spine ierformed on Dec 06, 2019 and I have reviewed and agree with the findings when they are official report is showing  T4-5 discitis osteomyelitis. Decreased conspicuity of ventral epidural abscess, epidural phlegmon and spinal canal narrowing at the T4-5 level.  T4-5 cord edema, unchanged.  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  Partially imaged posterior spinal fusion hardware. Please note lack of intravenous contrast and susceptibility artifact limits Evaluation.  Interim he developed profoundl fatigued and apparently had lost weight over the last several months.  He has some dizziness which seems to have preceded him being on doxycycline   We switched him from doxycycline to amoxicillin during that visit  Since then he was actually found to have significant gastric ulcers.  He is been taken off nonsteroidals and has not been drinking alcohol for the last 4 months.  On a PPI and Carafate  He has been putting back on weight and feeling much better his energy is improving appetite improving.  Still having some low back pain but this was much improved.  He was back on doxycycline instead of amoxicillin.  I saw him in August and we planned to have a repeat MRI which now was done this week.  Repeat MRI in April 23, 2020 shows:  Destructive changes at T4 and T5 that is progressed but overall seem to have improved in periods as far as his discitis and osteomyelitis in this region.  Dural abscess previously seen is completely resolved.  There is decreased marrow edema in the T6 vertebral body.  There is a fusion across the T9-T10 space where he previously had discitis and osteomyelitis.  Been found to have loosening hardware at T5 and T6 which Dr. Louanne Skye does not think is due to infection.  He would like to remove the loosened hardware which includes rods  and screws and apply a local bone graft and allograft.  The patient is anxious to come off antibiotics.  I was initially apprehensive about him coming off antibiotics because of concern that Dr. Louanne Skye might place hardware.  It sounds like that is not the plan but rather removal of hardware as planned.  Patient does continue to have mid back pain which can be severe at times up to 8-10 out of severity but he is able to get relief by changing position.  His peptic ulcer disease is improved with stopping nonsteroidals and he is cut out alcohol lately.  [place] is difficult for him to distinguish the pain he had from infection versus the pain he has from the hardware that is loose.        Past Medical History:  Diagnosis Date  . Acute pharyngitis 10/21/2013  . Allergy    grass and pollen  . Anxiety and depression 10/25/2011  . BPH (benign prostatic hyperplasia) 04/23/2012  . Chicken pox as a child  . DDD (degenerative disc disease)  cervical responds to steroid injections and low back required surgery  . DDD (degenerative disc disease), lumbosacral   . Diabetes mellitus    pre  . Dyspnea   . ED (erectile dysfunction) 04/23/2012  . Elevated BP   . 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 Esophageal reflux 02/10/2015  . Fatigue   . HTN (hypertension)   . Hyperglycemia    preDM   . Hyperlipidemia   . Insomnia   . Low back pain radiating to both legs 01/16/2017  . Measles as a child  . Medicare annual wellness visit, subsequent 02/10/2015  . Overweight(278.02)   . Peripheral neuropathy 08/07/2019  . Personal history of colonic polyps 10/27/2012   Follows with [facility]  . Pneumonia    " walking"  . Preventative health care   . 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  . Wears glasses     Past Surgical History:  Procedure Laterality Date  . BACK SURGERY  2012 and 1994   Dr Margret Chance, screws and cage in low back  . BIOPSY  01/09/2020   Procedure: BIOPSY;  Surgeon: Otis Brace,  MD;  Location: [facility];  Service: Gastroenterology;;  . COLONOSCOPY WITH PROPOFOL N/A 01/09/2020   Procedure: COLONOSCOPY WITH PROPOFOL;  Surgeon: Otis Brace, MD;  Location: [facility];  Service: Gastroenterology;  Laterality: N/A;  . ESOPHAGOGASTRODUODENOSCOPY (EGD) WITH PROPOFOL N/A 01/09/2020   Procedure: ESOPHAGOGASTRODUODENOSCOPY (EGD) WITH PROPOFOL;  Surgeon: Otis Brace, MD;  Location: [facility];  Service: Gastroenterology;  Laterality: N/A;  . EYE SURGERY Bilateral    2016  . HARDWARE REVISION  03/12/2019   REVISION OF HARDWARE THORACIC TEN-THORACIC ELEVEN WITH APPLICATION OF ADDITIONAL RODS AND ROD SLEEVES THORACIC EIGHT-THORACIC TWELVE 12-LUMBAR ONE (N/A )  . LAMINECTOMY  02/12/2019   DECOMPRESSIVE LAMINECTOMY THORACIC NINE-THORACIC TEN AND THORACIC TEN-THORACIC ELEVEN, EXTENSION OF THORACOLUMBAR FUSION FROM THORACIC TEN TO THORACIC FIVE, LOCAL BONE GRAFT, ALLOGRAFT AND VIVIGEN (N/A)  . POLYPECTOMY  01/09/2020   Procedure: POLYPECTOMY;  Surgeon: Otis Brace, MD;  Location: [facility];  Service: Gastroenterology;;  . SPINAL FUSION N/A 02/12/2019   Procedure: DECOMPRESSIVE LAMINECTOMY THORACIC NINE-THORACIC TEN AND THORACIC TEN-THORACIC ELEVEN, EXTENSION OF THORACOLUMBAR FUSION FROM THORACIC TEN TO THORACIC FIVE, LOCAL BONE GRAFT, ALLOGRAFT AND VIVIGEN;  Surgeon: Jessy Oto, MD;  Location: [facility];  Service: Orthopedics;  Laterality: N/A;  DECOMPRESSIVE LAMINECTOMY THORACIC NINE-THORACIC TEN AND THORACIC TEN-THORACIC ELEVEN, EXTENSION OF TH  . SPINAL FUSION N/A 03/12/2019   Procedure: REVISION OF HARDWARE THORACIC TEN-THORACIC ELEVEN WITH APPLICATION OF ADDITIONAL RODS AND ROD SLEEVES THORACIC EIGHT-THORACIC TWELVE 12-LUMBAR ONE;  Surgeon: Jessy Oto, MD;  Location: [facility];  Service: Orthopedics;  Laterality: N/A;  . TONSILLECTOMY    . TONSILLECTOMY     as child  . torn rotator cuff  2010   right    Family History  Problem Relation Age of Onset  .  Hypertension Mother   . Diabetes Mother        type 2  . Cancer Mother 29       breast in remission  . Emphysema Father        smoker  . COPD Father        smoker  . Stroke Father 70       mini  . Heart disease Father   . Hypertension Sister   . Hyperlipidemia Sister   . Scoliosis Sister   . Osteoporosis Sister   . Hypertension Maternal Grandmother   . Scoliosis Maternal Grandmother   . Heart disease  Maternal Grandfather   . Heart disease Paternal Grandfather        smoker  . Heart disease Daughter       Social History   Socioeconomic History  . Marital status: Married    Spouse name: Not on file  . Number of children: Not on file  . Years of education: Not on file  . Highest education level: Not on file  Occupational History  . Not on file  Tobacco Use  . Smoking status: Former Smoker    Packs/day: 1.00    Years: 20.00    Pack years: 20.00    Types: Cigarettes    Quit date: 07/18/1989    Years since quitting: 30.8  . Smokeless tobacco: Never Used  . Tobacco comment: off and on smoking for the 20 yrs  Vaping Use  . Vaping Use: Never used  Substance and Sexual Activity  . Alcohol use: Yes    Comment: drink or two a day- mixed drink  . Drug use: No  . Sexual activity: Yes    Comment: lives with wife, still working, no dietary restrictions, continues to exercise intermittently  Other Topics Concern  . Not on file  Social History Narrative  . Not on file   Social Determinants of Health   Financial Resource Strain: Low Risk   . Difficulty of Paying Living Expenses: Not hard at all  Food Insecurity:   . Worried About Charity fundraiser in the Last Year: Not on file  . Ran Out of Food in the Last Year: Not on file  Transportation Needs: No Transportation Needs  . Lack of Transportation (Medical): No  . Lack of Transportation (Non-Medical): No  Physical Activity:   . Days of Exercise per Week: Not on file  . Minutes of Exercise per Session: Not on file   Stress:   . Feeling of Stress : Not on file  Social Connections:   . Frequency of Communication with Friends and Family: Not on file  . Frequency of Social Gatherings with Friends and Family: Not on file  . Attends Religious Services: Not on file  . Active Member of Clubs or Organizations: Not on file  . Attends Archivist Meetings: Not on file  . Marital Status: Not on file    No Known Allergies   Current Outpatient Medications:  .  atorvastatin (LIPITOR) 10 MG tablet, TAKE 1 TABLET BY MOUTH EVERY DAY, Disp: 90 tablet, Rfl: 1 .  busPIRone (BUSPAR) 7.5 MG tablet, 1-2 TAB BY MOUTH 3 TIMES A DAY FOR ANXIETY (Patient taking differently: Take 7.5 mg by mouth 3 (three) times daily. ), Disp: 540 tablet, Rfl: 1 .  cetirizine (ZYRTEC) 10 MG tablet, Take 10 mg by mouth daily. In the morning, Disp: , Rfl:  .  doxycycline (VIBRA-TABS) 100 MG tablet, Take 1 tablet (100 mg total) by mouth 2 (two) times daily. (Patient not taking: Reported on 04/21/2020), Disp: 60 tablet, Rfl: 5 .  ferrous fumarate (FERRETTS) 325 (106 Fe) MG TABS tablet, TAKE 1 TABLET (106 MG OF IRON TOTAL) BY MOUTH DAILY., Disp: 30 tablet, Rfl: 2 .  glucose blood (CONTOUR NEXT TEST) test strip, Check blood sugar once daily, Disp: 100 each, Rfl: 12 .  HYDROcodone-acetaminophen (NORCO/VICODIN) 5-325 MG tablet, Take 1 tablet by mouth every 6 (six) hours as needed for moderate pain., Disp: 30 tablet, Rfl: 0 .  JANUVIA 50 MG tablet, TAKE 1 TABLET BY MOUTH EVERY DAY, Disp: 30 tablet, Rfl: 3 .  lisinopril (ZESTRIL) 5 MG tablet, Take 0.5 tablets (2.5 mg total) by mouth daily., Disp: , Rfl:  .  metFORMIN (GLUCOPHAGE) 1000 MG tablet, Take 1 tablet (1,000 mg total) by mouth 2 (two) times daily with a meal., Disp: 180 tablet, Rfl: 0 .  Multiple Vitamin (MULTIVITAMIN WITH MINERALS) TABS tablet, Take 1 tablet by mouth daily., Disp: , Rfl:  .  pantoprazole (PROTONIX) 40 MG tablet, TAKE 1 TABLET BY MOUTH TWICE A DAY BEFORE A MEAL, Disp: 60  tablet, Rfl: 1 .  pregabalin (LYRICA) 75 MG capsule, TAKE 1 CAPSULE BY MOUTH TWICE A DAY, Disp: 60 capsule, Rfl: 3 .  sildenafil (REVATIO) 20 MG tablet, TAKE ONE TO THREE TABLETS BY MOUTH DAILY AS NEEDED, Disp: 50 tablet, Rfl: 1 .  sucralfate (CARAFATE) 1 g tablet, TAKE 1 TABLET (1 G TOTAL) BY MOUTH 4 (FOUR) TIMES DAILY - WITH MEALS AND AT BEDTIME., Disp: 120 tablet, Rfl: 2 .  tamsulosin (FLOMAX) 0.4 MG CAPS capsule, Take 2 capsules (0.8 mg total) by mouth daily., Disp: 180 capsule, Rfl: 3 .  testosterone cypionate (DEPOTESTOSTERONE CYPIONATE) 200 MG/ML injection, INJECT 1 ML (200 MG TOTAL) INTO THE MUSCLE EVERY 14 (FOURTEEN) DAYS., Disp: 2 mL, Rfl: 0 .  tiZANidine (ZANAFLEX) 4 MG tablet, TAKE 1.5 TABLETS (6 MG TOTAL) BY MOUTH EVERY 6 (SIX) HOURS AS NEEDED FOR MUSCLE SPASMS., Disp: 180 tablet, Rfl: 3 .  venlafaxine XR (EFFEXOR-XR) 150 MG 24 hr capsule, Take 1 capsule (150 mg total) by mouth daily with breakfast., Disp: 90 capsule, Rfl: 1 .  zolpidem (AMBIEN) 5 MG tablet, TAKE 1 TABLET BY MOUTH AT BEDTIME AS NEEDED FOR SLEEP., Disp: 14 tablet, Rfl: 0   Review of Systems  Constitutional: Negative for activity change, appetite change, chills, fever and unexpected weight change.  HENT: Negative for congestion, rhinorrhea, sinus pressure, sneezing, sore throat and trouble swallowing.   Eyes: Negative for photophobia and visual disturbance.  Respiratory: Negative for cough, chest tightness, shortness of breath, wheezing and stridor.   Cardiovascular: Negative for chest pain, palpitations and leg swelling.  Gastrointestinal: Negative for abdominal distention, abdominal pain, anal bleeding, blood in stool, constipation, diarrhea, nausea and vomiting.  Genitourinary: Negative for difficulty urinating, dysuria, flank pain and hematuria.  Musculoskeletal: Positive for arthralgias and back pain. Negative for gait problem, joint swelling and myalgias.  Skin: Negative for color change, pallor, rash and wound.   Neurological: Negative for dizziness, tremors, weakness and light-headedness.  Hematological: Negative for adenopathy. Does not bruise/bleed easily.  Psychiatric/Behavioral: Negative for agitation, behavioral problems, confusion, decreased concentration, dysphoric mood and sleep disturbance.       Objective:   Physical Exam Constitutional:      General: He is not in acute distress.    Appearance: Normal appearance. He is well-developed. He is not ill-appearing or diaphoretic.  HENT:     Head: Normocephalic and atraumatic.     Right Ear: Hearing and external ear normal.     Left Ear: Hearing and external ear normal.     Nose: No nasal deformity or rhinorrhea.  Eyes:     General: No scleral icterus.    Conjunctiva/sclera: Conjunctivae normal.     Right eye: Right conjunctiva is not injected.     Left eye: Left conjunctiva is not injected.  Neck:     Vascular: No JVD.  Cardiovascular:     Rate and Rhythm: Normal rate and regular rhythm.     Heart sounds: S1 normal and S2 normal. No friction rub.  Pulmonary:  Effort: Pulmonary effort is normal. No respiratory distress.  Abdominal:     General: There is no distension.     Palpations: Abdomen is soft.  Musculoskeletal:        General: Normal range of motion.     Right shoulder: Normal.     Left shoulder: Normal.     Cervical back: Normal range of motion and neck supple.     Right hip: Normal.     Left hip: Normal.     Right knee: Normal.     Left knee: Normal.  Lymphadenopathy:     Head:     Right side of head: No submandibular, preauricular or posterior auricular adenopathy.     Left side of head: No submandibular, preauricular or posterior auricular adenopathy.     Cervical: No cervical adenopathy.     Right cervical: No superficial or deep cervical adenopathy.    Left cervical: No superficial or deep cervical adenopathy.  Skin:    General: Skin is warm and dry.     Coloration: Skin is not pale.     Findings: No  abrasion, bruising, ecchymosis, erythema, lesion or rash.     Nails: There is no clubbing.  Neurological:     General: No focal deficit present.     Mental Status: He is alert and oriented to person, place, and time.     Sensory: No sensory deficit.     Coordination: Coordination normal.     Gait: Gait normal.  Psychiatric:        Attention and Perception: Attention and perception normal. He is attentive. He does not perceive auditory hallucinations.        Mood and Affect: Mood and affect normal. Mood is not depressed.        Speech: Speech normal.        Behavior: Behavior normal. Behavior is cooperative.        Thought Content: Thought content normal.        Judgment: Judgment normal.           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  As mentioned there is loosening of hardware superior to this area that Dr. Louanne Skye is considering removing.  Since there is not a plan of placing hardware in place I think it is reasonable if the patient's inflammatory markers look encouraging to stop antibiotics and observe him off antibiotics.  Then if Dr. Louanne Skye goes into the operating room and remove the hardware and it appears infected he can certainly culture the area.   Gastric ulcers: These seem to be resolving.

## 2020-05-27 LAB — CBC WITH DIFFERENTIAL/PLATELET
Absolute Monocytes: 625 cells/uL (ref 200–950)
Basophils Absolute: 41 cells/uL (ref 0–200)
Basophils Relative: 0.7 %
Eosinophils Absolute: 100 cells/uL (ref 15–500)
Eosinophils Relative: 1.7 %
HCT: 42.4 % (ref 38.5–50.0)
Hemoglobin: 14.4 g/dL (ref 13.2–17.1)
Lymphs Abs: 584 cells/uL — ABNORMAL LOW (ref 850–3900)
MCH: 32.2 pg (ref 27.0–33.0)
MCHC: 34 g/dL (ref 32.0–36.0)
MCV: 94.9 fL (ref 80.0–100.0)
MPV: 8.7 fL (ref 7.5–12.5)
Monocytes Relative: 10.6 %
Neutro Abs: 4549 cells/uL (ref 1500–7800)
Neutrophils Relative %: 77.1 %
Platelets: 295 10*3/uL (ref 140–400)
RBC: 4.47 10*6/uL (ref 4.20–5.80)
RDW: 12.9 % (ref 11.0–15.0)
Total Lymphocyte: 9.9 %
WBC: 5.9 10*3/uL (ref 3.8–10.8)

## 2020-05-27 LAB — BASIC METABOLIC PANEL WITH GFR
BUN: 10 mg/dL (ref 7–25)
CO2: 32 mmol/L (ref 20–32)
Calcium: 9.5 mg/dL (ref 8.6–10.3)
Chloride: 94 mmol/L — ABNORMAL LOW (ref 98–110)
Creat: 0.73 mg/dL (ref 0.70–1.18)
GFR, Est African American: 104 mL/min/{1.73_m2} (ref >=60)
GFR, Est Non African American: 90 mL/min/{1.73_m2} (ref >=60)
Glucose, Bld: 159 mg/dL — ABNORMAL HIGH (ref 65–99)
Potassium: 4.7 mmol/L (ref 3.5–5.3)
Sodium: 133 mmol/L — ABNORMAL LOW (ref 135–146)

## 2020-05-27 LAB — C-REACTIVE PROTEIN: CRP: 4.7 mg/L (ref <8.0)

## 2020-05-27 LAB — SEDIMENTATION RATE: Sed Rate: 2 mm/h (ref 0–20)

## 2020-05-28 ENCOUNTER — Ambulatory Visit: Payer: Medicare Other | Admitting: Specialist

## 2020-05-29 ENCOUNTER — Other Ambulatory Visit: Payer: Self-pay

## 2020-05-29 ENCOUNTER — Ambulatory Visit (INDEPENDENT_AMBULATORY_CARE_PROVIDER_SITE_OTHER): Payer: Medicare Other

## 2020-05-29 DIAGNOSIS — E291 Testicular hypofunction: Secondary | ICD-10-CM

## 2020-05-29 MED ORDER — TESTOSTERONE CYPIONATE 200 MG/ML IM SOLN
200.0000 mg | Freq: Once | INTRAMUSCULAR | Status: AC
  Administered 2020-05-29: 200 mg via INTRAMUSCULAR

## 2020-05-29 NOTE — Progress Notes (Addendum)
Patient here today for testosterone injection per Mackie Pai. 1 mL givne in right upper outer quadrant. Patient tolerated well.   Mackie Pai, PA-C

## 2020-06-01 ENCOUNTER — Encounter: Payer: Self-pay | Admitting: Medical

## 2020-06-01 DIAGNOSIS — M150 Primary generalized (osteo)arthritis: Secondary | ICD-10-CM | POA: Diagnosis not present

## 2020-06-01 DIAGNOSIS — M4628 Osteomyelitis of vertebra, sacral and sacrococcygeal region: Secondary | ICD-10-CM | POA: Diagnosis not present

## 2020-06-01 DIAGNOSIS — M961 Postlaminectomy syndrome, not elsewhere classified: Secondary | ICD-10-CM | POA: Diagnosis not present

## 2020-06-01 DIAGNOSIS — G894 Chronic pain syndrome: Secondary | ICD-10-CM | POA: Diagnosis not present

## 2020-06-03 ENCOUNTER — Telehealth: Payer: Self-pay | Admitting: Medical

## 2020-06-03 ENCOUNTER — Encounter: Payer: Self-pay | Admitting: Medical

## 2020-06-03 MED ORDER — ZOLPIDEM TARTRATE 5 MG PO TABS: 5.0000 mg | ORAL_TABLET | Freq: Every evening | ORAL | 0 refills | Status: DC | PRN

## 2020-06-03 NOTE — Telephone Encounter (Signed)
Refilled ambien

## 2020-06-03 NOTE — Telephone Encounter (Signed)
Rx ambien sent to pt pharmacy.

## 2020-06-06 ENCOUNTER — Other Ambulatory Visit: Payer: Self-pay | Admitting: Specialist

## 2020-06-09 ENCOUNTER — Other Ambulatory Visit: Payer: Self-pay

## 2020-06-09 ENCOUNTER — Ambulatory Visit (HOSPITAL_BASED_OUTPATIENT_CLINIC_OR_DEPARTMENT_OTHER): Payer: Medicare Other | Attending: Anesthesiology | Admitting: Internal Medicine

## 2020-06-09 DIAGNOSIS — R5383 Other fatigue: Secondary | ICD-10-CM | POA: Diagnosis not present

## 2020-06-09 DIAGNOSIS — L570 Actinic keratosis: Secondary | ICD-10-CM | POA: Diagnosis not present

## 2020-06-09 DIAGNOSIS — G478 Other sleep disorders: Secondary | ICD-10-CM | POA: Diagnosis not present

## 2020-06-09 DIAGNOSIS — R0683 Snoring: Secondary | ICD-10-CM | POA: Diagnosis not present

## 2020-06-09 DIAGNOSIS — L8990 Pressure ulcer of unspecified site, unspecified stage: Secondary | ICD-10-CM | POA: Diagnosis not present

## 2020-06-09 DIAGNOSIS — G4710 Hypersomnia, unspecified: Secondary | ICD-10-CM

## 2020-06-11 ENCOUNTER — Encounter: Payer: Self-pay | Admitting: Specialist

## 2020-06-16 ENCOUNTER — Other Ambulatory Visit: Payer: Self-pay

## 2020-06-16 ENCOUNTER — Other Ambulatory Visit: Payer: Self-pay | Admitting: Medical

## 2020-06-16 ENCOUNTER — Ambulatory Visit (INDEPENDENT_AMBULATORY_CARE_PROVIDER_SITE_OTHER): Payer: Medicare Other

## 2020-06-16 DIAGNOSIS — E291 Testicular hypofunction: Secondary | ICD-10-CM | POA: Diagnosis not present

## 2020-06-16 NOTE — Progress Notes (Addendum)
Luis Brown is a 74 y.o. male presents to the office today for Testosterore cypionate 200 mg/ml every 14 days injections, per physician's orders on prescription.   1 ml was administered today. Patient tolerated injection.  Patient next injection duein about 2 weeks appt made for 07-02-2020. Patient provided vial of testosterone cypionate 200 mg/ml lot #[id number] with exp date 09-16-2022. [place] [phone].  [place] with administration of testosterone.  Mackie Pai, PA-C

## 2020-06-17 ENCOUNTER — Other Ambulatory Visit: Payer: Self-pay | Admitting: Medical

## 2020-06-21 DIAGNOSIS — R0683 Snoring: Secondary | ICD-10-CM

## 2020-06-21 NOTE — Procedures (Signed)
    Patient Name: Luis Brown, Luis Brown Date: 06/09/2020 Gender: Male D.O.B: 1945/10/29 Age (years): 74 Referring Provider: Nicholaus Bloom Height (inches): 70 Interpreting Physician: Baird Lyons MD, ABSM Weight (lbs): 170 RPSGT: Zadie Rhine BMI: 24 MRN: [id number] Neck Size: 16.00  CLINICAL INFORMATION Sleep Study Type: NPSG Indication for sleep study: Fatigue, Hypertension Epworth Sleepiness Score: 2  SLEEP STUDY TECHNIQUE As per the AASM Manual for the Scoring of Sleep and Associated Events v2.3 (April 2016) with a hypopnea requiring 4% desaturations.  The channels recorded and monitored were frontal, central and occipital EEG, electrooculogram (EOG), submentalis EMG (chin), nasal and oral airflow, thoracic and abdominal wall motion, anterior tibialis EMG, snore microphone, electrocardiogram, and pulse oximetry.  MEDICATIONS Medications self-administered by patient taken the night of the study : BUSPAR, VIBRA-TABS, NORCO/VICODIN, ZESTRIL, METFORMIN, LYRICA, CARAFATE, FLOMAX, ZANAFLEX, MELATONIN  SLEEP ARCHITECTURE The study was initiated at 10:40:48 PM and ended at 4:39:27 AM.  Sleep onset time was 25.5 minutes and the sleep efficiency was 52.7%%. The total sleep time was 189 minutes.  Stage REM latency was N/A minutes.  The patient spent 9.5%% of the night in stage N1 sleep, 90.5%% in stage N2 sleep, 0.0%% in stage N3 and 0% in REM.  Alpha intrusion was absent.  Supine sleep was 2.91%.  RESPIRATORY PARAMETERS The overall apnea/hypopnea index (AHI) was 0.0 per hour. There were 0 total apneas, including 0 obstructive, 0 central and 0 mixed apneas. There were 0 hypopneas and 2 RERAs.  The AHI during Stage REM sleep was N/A per hour.  AHI while supine was 0.0 per hour.  The mean oxygen saturation was 94.6%. The minimum SpO2 during sleep was 92.0%.  snoring was noted during this study.  CARDIAC DATA The 2 lead EKG demonstrated sinus rhythm. The mean heart rate was  57.0 beats per minute. Other EKG findings include: None.  LEG MOVEMENT DATA The total PLMS were 0 with a resulting PLMS index of 0.0. Associated arousal with leg movement index was 0.0 .  IMPRESSIONS - No significant obstructive sleep apnea occurred during this study (AHI = 0.0/h). - No significant central sleep apnea occurred during this study (CAI = 0.0/h). - The patient had minimal or no oxygen desaturation during the study (Min O2 = 92.0%) - No snoring was audible during this study. - No cardiac abnormalities were noted during this study. - Clinically significant periodic limb movements did not occur during sleep. No significant associated arousals. - Patient woke at 2:30AM and was unable to regain sleep. He indicated recent death of family membetr may affect sleep.  DIAGNOSIS - Other sleep disorder  RECOMMENDATIONS - Consider managing for insomnia and/ or depression if appropriate, based on clinical judgment. - Sleep hygiene should be reviewed to assess factors that may improve sleep quality. - Weight management and regular exercise should be initiated or continued if appropriate.  [Electronically signed] 06/21/2020 12:18 PM  Baird Lyons MD, [place], American Board of Sleep Medicine   NPI: [id number]                         [place], [employer]  ELECTRONICALLY SIGNED ON:  06/21/2020, 12:15 PM [place] PH: [phone]   FX: [phone] [place]

## 2020-06-22 ENCOUNTER — Encounter: Payer: Self-pay | Admitting: Family Medicine

## 2020-06-24 ENCOUNTER — Other Ambulatory Visit: Payer: Self-pay | Admitting: Medical

## 2020-06-24 ENCOUNTER — Encounter: Payer: Self-pay | Admitting: Specialist

## 2020-06-24 NOTE — Telephone Encounter (Signed)
Requesting: Ambien 5mg  Contract: 01/25/2018 UDS: 08/17/2018 Last Visit: 04/28/2020 Next Visit: 06/29/2020 Last Refill: 06/03/2020 #30 and 0RF  Please Advise

## 2020-06-26 NOTE — Telephone Encounter (Signed)
Notify pt that I sent in [place]. But only use 5 mg at night.  In the past he had severe insomnia and use 2 tab of ambien. But if he runs out and we try to refill then pharmacy probably won't fill.  In his age group taking 10 mg a night not recommended. Medicare most of time won't cover 10 mg tabs.

## 2020-06-29 ENCOUNTER — Ambulatory Visit (INDEPENDENT_AMBULATORY_CARE_PROVIDER_SITE_OTHER): Payer: Medicare Other | Admitting: Medical

## 2020-06-29 ENCOUNTER — Other Ambulatory Visit: Payer: Self-pay

## 2020-06-29 VITALS — BP 120/64 | HR 70 | Temp 98.2°F | Resp 18 | Ht 70.0 in | Wt 174.0 lb

## 2020-06-29 DIAGNOSIS — Z23 Encounter for immunization: Secondary | ICD-10-CM | POA: Diagnosis not present

## 2020-06-29 DIAGNOSIS — E785 Hyperlipidemia, unspecified: Secondary | ICD-10-CM

## 2020-06-29 DIAGNOSIS — E1169 Type 2 diabetes mellitus with other specified complication: Secondary | ICD-10-CM

## 2020-06-29 DIAGNOSIS — I10 Essential (primary) hypertension: Secondary | ICD-10-CM | POA: Diagnosis not present

## 2020-06-29 DIAGNOSIS — G4700 Insomnia, unspecified: Secondary | ICD-10-CM

## 2020-06-29 DIAGNOSIS — D649 Anemia, unspecified: Secondary | ICD-10-CM

## 2020-06-29 DIAGNOSIS — R7989 Other specified abnormal findings of blood chemistry: Secondary | ICD-10-CM | POA: Diagnosis not present

## 2020-06-29 DIAGNOSIS — K219 Gastro-esophageal reflux disease without esophagitis: Secondary | ICD-10-CM

## 2020-06-29 DIAGNOSIS — E119 Type 2 diabetes mellitus without complications: Secondary | ICD-10-CM

## 2020-06-29 MED ORDER — TESTOSTERONE CYPIONATE 200 MG/ML IM SOLN: 200.0000 mg | INTRAMUSCULAR | 0 refills | Status: DC

## 2020-06-29 NOTE — Addendum Note (Signed)
Addended by: Jeronimo Greaves on: 06/29/2020 08:37 AM   Modules accepted: Orders

## 2020-06-29 NOTE — Progress Notes (Signed)
Subjective:    [name], male    DOB: [birth year], 74 y.o.   MRN: [id number]  HPI Pt has hx of htn. He is on lisinoropril daily. Bp is controlled.  Hx of diabetes. Last a1c 3 months ago showed a1c 6.4.  Pt has low testosterone. He is on supplamentation/injections.  Hx of insomnia. On low dose ambien. Insomnia recently worse since death of son. Pt sleep study was negative. Recently sleeping better with low dose ambien.   Hx of anemia in the past.  Hx of gerd.   Hx of back surgery. He has bone infection. Pt might be getting another surgery in near future. He is trying to get scheduled.    Review of Systems  Constitutional: Negative for chills, fatigue and fever.  HENT: Negative for congestion, ear discharge, ear pain and facial swelling.   Respiratory: Negative for cough, chest tightness, shortness of breath and wheezing.   Cardiovascular: Negative for chest pain and palpitations.  Gastrointestinal: Negative for abdominal pain.  Genitourinary: Negative for dysuria.  Musculoskeletal: Positive for back pain.  Skin: Negative for rash.  Neurological: Negative for dizziness, weakness and headaches.  Hematological: Negative for adenopathy. Does not bruise/bleed easily.  Psychiatric/Behavioral: Positive for dysphoric mood and sleep disturbance. Negative for behavioral problems, decreased concentration and suicidal ideas. The patient is nervous/anxious.        But improved now.    Past Medical History:  Diagnosis Date  . Acute pharyngitis 10/21/2013  . Allergy    grass and pollen  . Anxiety and depression 10/25/2011  . BPH (benign prostatic hyperplasia) 04/23/2012  . Chicken pox as a child  . DDD (degenerative disc disease)    cervical responds to steroid injections and low back required surgery  . DDD (degenerative disc disease), lumbosacral   . Diabetes mellitus    pre  . Dyspnea   . ED (erectile dysfunction) 04/23/2012  . Elevated BP   . 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 Esophageal reflux 02/10/2015  . Fatigue   . HTN (hypertension)   . Hyperglycemia    preDM   . Hyperlipidemia   . Insomnia   . Low back pain radiating to both legs 01/16/2017  . Measles as a child  . Medicare annual wellness visit, subsequent 02/10/2015  . Overweight(278.02)   . Peripheral neuropathy 08/07/2019  . Personal history of colonic polyps 10/27/2012   Follows with [facility]  . Pneumonia    " walking"  . Preventative health care   . 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  . Wears glasses      Social History   Socioeconomic History  . Marital status: Married    Spouse name: Not on file  . Number of children: Not on file  . Years of education: Not on file  . Highest education level: Not on file  Occupational History  . Not on file  Tobacco Use  . Smoking status: Former Smoker    Packs/day: 1.00    Years: 20.00    Pack years: 20.00    Types: Cigarettes    Quit date: 07/18/1989    Years since quitting: 30.9  . Smokeless tobacco: Never Used  . Tobacco comment: off and on smoking for the 20 yrs  Vaping Use  . Vaping Use: Never used  Substance and Sexual Activity  . Alcohol use: Yes    Comment: drink or two a day- mixed drink  . Drug use: No  . Sexual activity: Yes  Comment: lives with wife, still working, no dietary restrictions, continues to exercise intermittently  Other Topics Concern  . Not on file  Social History Narrative  . Not on file   Social Determinants of Health   Financial Resource Strain: Low Risk   . Difficulty of Paying Living Expenses: Not hard at all  Food Insecurity: Not on file  Transportation Needs: No Transportation Needs  . Lack of Transportation (Medical): No  . Lack of Transportation (Non-Medical): No  Physical Activity: Not on file  Stress: Not on file  Social Connections: Not on file  Intimate Partner Violence: Not on file    Past Surgical History:  Procedure Laterality Date  . BACK SURGERY  2012 and  1994   Dr Margret Chance, screws and cage in low back  . BIOPSY  01/09/2020   Procedure: BIOPSY;  Surgeon: Otis Brace, MD;  Location: [facility];  Service: Gastroenterology;;  . COLONOSCOPY WITH PROPOFOL N/A 01/09/2020   Procedure: COLONOSCOPY WITH PROPOFOL;  Surgeon: Otis Brace, MD;  Location: [facility];  Service: Gastroenterology;  Laterality: N/A;  . ESOPHAGOGASTRODUODENOSCOPY (EGD) WITH PROPOFOL N/A 01/09/2020   Procedure: ESOPHAGOGASTRODUODENOSCOPY (EGD) WITH PROPOFOL;  Surgeon: Otis Brace, MD;  Location: [facility];  Service: Gastroenterology;  Laterality: N/A;  . EYE SURGERY Bilateral    2016  . HARDWARE REVISION  03/12/2019   REVISION OF HARDWARE THORACIC TEN-THORACIC ELEVEN WITH APPLICATION OF ADDITIONAL RODS AND ROD SLEEVES THORACIC EIGHT-THORACIC TWELVE 12-LUMBAR ONE (N/A )  . LAMINECTOMY  02/12/2019   DECOMPRESSIVE LAMINECTOMY THORACIC NINE-THORACIC TEN AND THORACIC TEN-THORACIC ELEVEN, EXTENSION OF THORACOLUMBAR FUSION FROM THORACIC TEN TO THORACIC FIVE, LOCAL BONE GRAFT, ALLOGRAFT AND VIVIGEN (N/A)  . POLYPECTOMY  01/09/2020   Procedure: POLYPECTOMY;  Surgeon: Otis Brace, MD;  Location: [facility];  Service: Gastroenterology;;  . SPINAL FUSION N/A 02/12/2019   Procedure: DECOMPRESSIVE LAMINECTOMY THORACIC NINE-THORACIC TEN AND THORACIC TEN-THORACIC ELEVEN, EXTENSION OF THORACOLUMBAR FUSION FROM THORACIC TEN TO THORACIC FIVE, LOCAL BONE GRAFT, ALLOGRAFT AND VIVIGEN;  Surgeon: Jessy Oto, MD;  Location: [place];  Service: Orthopedics;  Laterality: N/A;  DECOMPRESSIVE LAMINECTOMY THORACIC NINE-THORACIC TEN AND THORACIC TEN-THORACIC ELEVEN, EXTENSION OF TH  . SPINAL FUSION N/A 03/12/2019   Procedure: REVISION OF HARDWARE THORACIC TEN-THORACIC ELEVEN WITH APPLICATION OF ADDITIONAL RODS AND ROD SLEEVES THORACIC EIGHT-THORACIC TWELVE 12-LUMBAR ONE;  Surgeon: Jessy Oto, MD;  Location: [place];  Service: Orthopedics;  Laterality: N/A;  . TONSILLECTOMY    .  TONSILLECTOMY     as child  . torn rotator cuff  2010   right    Family History  Problem Relation Age of Onset  . Hypertension Mother   . Diabetes Mother        type 2  . Cancer Mother 22       breast in remission  . Emphysema Father        smoker  . COPD Father        smoker  . Stroke Father 34       mini  . Heart disease Father   . Hypertension Sister   . Hyperlipidemia Sister   . Scoliosis Sister   . Osteoporosis Sister   . Hypertension Maternal Grandmother   . Scoliosis Maternal Grandmother   . Heart disease Maternal Grandfather   . Heart disease Paternal Grandfather        smoker  . Heart disease Daughter     No Known Allergies  Current Outpatient Medications on File Prior to Visit  Medication Sig  Dispense Refill  . atorvastatin (LIPITOR) 10 MG tablet TAKE 1 TABLET BY MOUTH EVERY DAY 90 tablet 1  . busPIRone (BUSPAR) 7.5 MG tablet 1-2 TAB BY MOUTH 3 TIMES A DAY FOR ANXIETY (Patient taking differently: Take 7.5 mg by mouth 3 (three) times daily.) 540 tablet 1  . famotidine (PEPCID) 40 MG tablet TAKE 1 TABLET BY MOUTH EVERYDAY AT BEDTIME 90 tablet 1  . ferrous fumarate (FERRETTS) 325 (106 Fe) MG TABS tablet TAKE 1 TABLET (106 MG OF IRON TOTAL) BY MOUTH DAILY. 30 tablet 2  . glucose blood (CONTOUR NEXT TEST) test strip Check blood sugar once daily 100 each 12  . HYDROcodone-acetaminophen (NORCO/VICODIN) 5-325 MG tablet Take 1 tablet by mouth every 6 (six) hours as needed for moderate pain. 30 tablet 0  . JANUVIA 50 MG tablet TAKE 1 TABLET BY MOUTH EVERY DAY 30 tablet 3  . lisinopril (ZESTRIL) 5 MG tablet Take 0.5 tablets (2.5 mg total) by mouth daily.    . metFORMIN (GLUCOPHAGE) 1000 MG tablet Take 1 tablet (1,000 mg total) by mouth 2 (two) times daily with a meal. 180 tablet 0  . Multiple Vitamin (MULTIVITAMIN WITH MINERALS) TABS tablet Take 1 tablet by mouth daily.    . pantoprazole (PROTONIX) 40 MG tablet TAKE 1 TABLET BY MOUTH TWICE A DAY BEFORE A MEAL 180 tablet 1   . pregabalin (LYRICA) 75 MG capsule TAKE 1 CAPSULE BY MOUTH TWICE A DAY 60 capsule 3  . sildenafil (REVATIO) 20 MG tablet TAKE ONE TO THREE TABLETS BY MOUTH DAILY AS NEEDED 50 tablet 1  . sucralfate (CARAFATE) 1 g tablet TAKE 1 TABLET (1 G TOTAL) BY MOUTH 4 (FOUR) TIMES DAILY - WITH MEALS AND AT BEDTIME. 120 tablet 2  . tiZANidine (ZANAFLEX) 4 MG tablet TAKE 1.5 TABLETS (6 MG TOTAL) BY MOUTH EVERY 6 (SIX) HOURS AS NEEDED FOR MUSCLE SPASMS. 540 tablet 1  . venlafaxine XR (EFFEXOR-XR) 150 MG 24 hr capsule TAKE 1 CAPSULE (150 MG TOTAL) BY MOUTH DAILY WITH BREAKFAST. 90 capsule 1  . zolpidem (AMBIEN) 5 MG tablet TAKE 1 TABLET BY MOUTH AT BEDTIME AS NEEDED FOR SLEEP. 30 tablet 0  . cetirizine (ZYRTEC) 10 MG tablet Take 10 mg by mouth daily. In the morning (Patient not taking: Reported on 06/29/2020)    . doxycycline (VIBRA-TABS) 100 MG tablet Take 1 tablet (100 mg total) by mouth 2 (two) times daily. (Patient not taking: Reported on 06/29/2020) 60 tablet 5  . tamsulosin (FLOMAX) 0.4 MG CAPS capsule Take 2 capsules (0.8 mg total) by mouth daily. (Patient not taking: No sig reported) 180 capsule 3   No current facility-administered medications on file prior to visit.    BP 120/64   Pulse 70   Temp 98.2 F (36.8 C) (Oral)   Resp 18   Ht 5\' 10"  (1.778 m)   Wt 174 lb (78.9 kg)   SpO2 98%   BMI 24.97 kg/m      Objective:   Physical Exam  General Mental Status- Alert. General Appearance- Not in acute distress.   Skin General: Color- Normal Color. Moisture- Normal Moisture.  Neck Carotid Arteries- Normal color. Moisture- Normal Moisture. No carotid bruits. No JVD.  Chest and Lung Exam Auscultation: Breath Sounds:-Normal.  Cardiovascular Auscultation:Rythm- Regular. Murmurs & Other Heart Sounds:Auscultation of the heart reveals- No Murmurs.  Abdomen Inspection:-Inspeection Normal. Palpation/Percussion:Note:No mass. Palpation and Percussion of the abdomen reveal- Non Tender, Non  Distended + BS, no rebound or guarding.   Neurologic Cranial  Nerve exam:- CN III-XII intact(No nystagmus), symmetric smile. Strength:- 5/5 equal and symmetric strength both upper and lower extremities.      Assessment & Plan:  Bp well controlled. Continue lisinopril.  For diabetes, will get future U9W and metabolic panel.   Fow testosterone continue supplament injections.  For insomnia continue ambien.  For anxiety and mood continue buspar and effexor. Mood better controlled recently.  For anemia and hx of ulcer will get cbc future. Pt had ensodoscopy one month ago and ulcer was healing.  For back pain follow up with specialist for upcoming surgery.   Follow up 3 months past future lab date or sooner if needed  [employer], PA-C

## 2020-06-29 NOTE — Patient Instructions (Addendum)
Bp well controlled. Continue lisinopril.  For diabetes, will get future G2I and metabolic panel. Continue metformin and januvia. Fow testosterone continue supplament injections.  For insomnia continue ambien.  For anxiety and mood continue buspar and effexor. Mood better controlled recently.  For anemia and hx of ulcer will get cbc future. Pt had ensodoscopy one month ago and ulcer was healing.  For back pain follow up with specialist for upcoming surgery.   Follow up 3 months past future lab date or sooner if needed.

## 2020-06-30 DIAGNOSIS — M150 Primary generalized (osteo)arthritis: Secondary | ICD-10-CM | POA: Diagnosis not present

## 2020-06-30 DIAGNOSIS — G894 Chronic pain syndrome: Secondary | ICD-10-CM | POA: Diagnosis not present

## 2020-06-30 DIAGNOSIS — M961 Postlaminectomy syndrome, not elsewhere classified: Secondary | ICD-10-CM | POA: Diagnosis not present

## 2020-06-30 DIAGNOSIS — M4628 Osteomyelitis of vertebra, sacral and sacrococcygeal region: Secondary | ICD-10-CM | POA: Diagnosis not present

## 2020-07-01 ENCOUNTER — Other Ambulatory Visit: Payer: Self-pay

## 2020-07-01 ENCOUNTER — Encounter (HOSPITAL_BASED_OUTPATIENT_CLINIC_OR_DEPARTMENT_OTHER): Payer: Medicare Other | Admitting: Internal Medicine

## 2020-07-01 ENCOUNTER — Other Ambulatory Visit (INDEPENDENT_AMBULATORY_CARE_PROVIDER_SITE_OTHER): Payer: Medicare Other

## 2020-07-01 DIAGNOSIS — E119 Type 2 diabetes mellitus without complications: Secondary | ICD-10-CM | POA: Diagnosis not present

## 2020-07-01 DIAGNOSIS — I10 Essential (primary) hypertension: Secondary | ICD-10-CM

## 2020-07-01 DIAGNOSIS — D649 Anemia, unspecified: Secondary | ICD-10-CM | POA: Diagnosis not present

## 2020-07-01 LAB — COMPREHENSIVE METABOLIC PANEL
ALT: 11 U/L (ref 0–53)
AST: 24 U/L (ref 0–37)
Albumin: 4.4 g/dL (ref 3.5–5.2)
Alkaline Phosphatase: 75 U/L (ref 39–117)
BUN: 16 mg/dL (ref 6–23)
CO2: 33 mEq/L — ABNORMAL HIGH (ref 19–32)
Calcium: 9.7 mg/dL (ref 8.4–10.5)
Chloride: 95 mEq/L — ABNORMAL LOW (ref 96–112)
Creatinine, Ser: 0.75 mg/dL (ref 0.40–1.50)
GFR: 87.45 mL/min (ref >60.00)
Glucose, Bld: 111 mg/dL — ABNORMAL HIGH (ref 70–99)
Potassium: 4.6 mEq/L (ref 3.5–5.1)
Sodium: 133 mEq/L — ABNORMAL LOW (ref 135–145)
Total Bilirubin: 0.7 mg/dL (ref 0.2–1.2)
Total Protein: 6.6 g/dL (ref 6.0–8.3)

## 2020-07-01 LAB — LIPID PANEL
Cholesterol: 112 mg/dL (ref 0–200)
HDL: 56.1 mg/dL (ref >39.00)
LDL Cholesterol: 42 mg/dL (ref 0–99)
NonHDL: 55.66
Total CHOL/HDL Ratio: 2
Triglycerides: 66 mg/dL (ref 0.0–149.0)
VLDL: 13.2 mg/dL (ref 0.0–40.0)

## 2020-07-01 LAB — CBC WITH DIFFERENTIAL/PLATELET
Basophils Absolute: 0 10*3/uL (ref 0.0–0.1)
Basophils Relative: 0.6 % (ref 0.0–3.0)
Eosinophils Absolute: 0.1 10*3/uL (ref 0.0–0.7)
Eosinophils Relative: 2 % (ref 0.0–5.0)
HCT: 42.7 % (ref 39.0–52.0)
Hemoglobin: 14.6 g/dL (ref 13.0–17.0)
Lymphocytes Relative: 10.1 % — ABNORMAL LOW (ref 12.0–46.0)
Lymphs Abs: 0.7 10*3/uL (ref 0.7–4.0)
MCHC: 34.2 g/dL (ref 30.0–36.0)
MCV: 94.5 fl (ref 78.0–100.0)
Monocytes Absolute: 1 10*3/uL (ref 0.1–1.0)
Monocytes Relative: 14.4 % — ABNORMAL HIGH (ref 3.0–12.0)
Neutro Abs: 4.8 10*3/uL (ref 1.4–7.7)
Neutrophils Relative %: 72.9 % (ref 43.0–77.0)
Platelets: 330 10*3/uL (ref 150.0–400.0)
RBC: 4.52 Mil/uL (ref 4.22–5.81)
RDW: 13.5 % (ref 11.5–15.5)
WBC: 6.6 10*3/uL (ref 4.0–10.5)

## 2020-07-01 LAB — HEMOGLOBIN A1C: Hgb A1c MFr Bld: 6.5 % (ref 4.6–6.5)

## 2020-07-02 ENCOUNTER — Ambulatory Visit (INDEPENDENT_AMBULATORY_CARE_PROVIDER_SITE_OTHER): Payer: Medicare Other

## 2020-07-02 DIAGNOSIS — R7989 Other specified abnormal findings of blood chemistry: Secondary | ICD-10-CM | POA: Diagnosis not present

## 2020-07-02 MED ORDER — TESTOSTERONE CYPIONATE 200 MG/ML IM SOLN
200.0000 mg | Freq: Once | INTRAMUSCULAR | Status: AC
  Administered 2020-07-02: 200 mg via INTRAMUSCULAR

## 2020-07-02 NOTE — Progress Notes (Addendum)
Patient came in today for testosterone injection per Mackie Pai.  Patient tolerated injection well , given IM Lef glut.   Mackie Pai, PA-C

## 2020-07-04 ENCOUNTER — Telehealth: Payer: Self-pay | Admitting: Medical

## 2020-07-04 ENCOUNTER — Other Ambulatory Visit: Payer: Self-pay | Admitting: Medical

## 2020-07-04 DIAGNOSIS — E119 Type 2 diabetes mellitus without complications: Secondary | ICD-10-CM

## 2020-07-04 DIAGNOSIS — E1169 Type 2 diabetes mellitus with other specified complication: Secondary | ICD-10-CM

## 2020-07-04 DIAGNOSIS — Z01810 Encounter for preprocedural cardiovascular examination: Secondary | ICD-10-CM

## 2020-07-04 DIAGNOSIS — I10 Essential (primary) hypertension: Secondary | ICD-10-CM

## 2020-07-04 NOTE — Telephone Encounter (Signed)
Opened to place the cardiologist referral.

## 2020-07-04 NOTE — Telephone Encounter (Signed)
Pt orthopedist wants him to have  Cardiac clearance for surgery. Placed cardiologist referral. Will you notify pt with his age, htn, diabetes and high cholesterol do want cardiologist to clear.

## 2020-07-05 ENCOUNTER — Encounter: Payer: Self-pay | Admitting: Specialist

## 2020-07-06 ENCOUNTER — Telehealth: Payer: Self-pay

## 2020-07-06 ENCOUNTER — Other Ambulatory Visit: Payer: Self-pay | Admitting: Radiology

## 2020-07-06 ENCOUNTER — Encounter: Payer: Self-pay | Admitting: Medical

## 2020-07-06 MED ORDER — METHOCARBAMOL 500 MG PO TABS: 500.0000 mg | ORAL_TABLET | Freq: Three times a day (TID) | ORAL | 0 refills | Status: DC

## 2020-07-06 NOTE — Telephone Encounter (Signed)
Received a call from [facility] in [place] upstairs and he said they got an urgent referral on [employer]" from you. Pt told them he knew nothing about it and did not want to schedule with them until he heard from you. Did not know if you maybe wanted to Mychart message him regarding referral.

## 2020-07-06 NOTE — Telephone Encounter (Signed)
I sent pt my chart message regarding cardiac clearance appointment with upstairs. Will you have him review. Have him call back cardiolgoist to get scheduled.

## 2020-07-12 ENCOUNTER — Other Ambulatory Visit: Payer: Self-pay | Admitting: Medical

## 2020-07-15 ENCOUNTER — Encounter: Payer: Self-pay | Admitting: Medical

## 2020-07-16 ENCOUNTER — Telehealth: Payer: Self-pay

## 2020-07-16 NOTE — Telephone Encounter (Signed)
Returned patients call about moving appointment back, left a voicemail to call us back and get that rescheduled for him.Marland Kitchen

## 2020-07-20 ENCOUNTER — Other Ambulatory Visit: Payer: Self-pay

## 2020-07-20 NOTE — Progress Notes (Signed)
Cardiology Office Note:    Date:  07/21/2020   ID:  Luis Brown, DOB Jan 22, 1946, MRN [id number]  PCP:  Mackie Pai, PA-C  Cardiologist:  Shirlee More, MD   Referring MD: Mackie Pai, PA-C  ASSESSMENT:    1. Preoperative cardiovascular examination   2. Primary hypertension   3. Mixed hyperlipidemia   4. Type 2 diabetes mellitus without complication, without long-term current use of insulin (HCC)    PLAN:    In order of problems listed above:  1. Please see the discussion below his revised cardiac risk score is 0 in my opinion he is optimized for his planned procedure and requires no further preoperative cardiac evaluation. 2. Stable BP at target continue usual antihypertensive agents 3. Stable continue statin 4. Stable managed by his PCP  Next appointment as needed in the future   Medication Adjustments/Labs and Tests Ordered: Current medicines are reviewed at length with the patient today.  Concerns regarding medicines are outlined above.  Orders Placed This Encounter  Procedures  . EKG 12-Lead   No orders of the defined types were placed in this encounter.    Chief Complaint  Patient presents with  . Pre-op Exam    History of Present Illness:    Luis Brown is a 75 y.o. male 3 of type 2 diabetes hypertension and hyper lipidemia who is being seen today for reoperative cardiovascular evaluation at the request of [place], Percell Miller, [place].  Chart review shows EKG 01/07/2020 sinus rhythm normal EKG. CT of chest pulmonary embolism protocol showed pulmonary nodules no finding of pulmonary embolism and the cardiovascular structures were normal without coronary artery calcification.  He did have aortic atherosclerosis on CT of the abdomen at the same time. He had a sleep study performed in November which did not show findings of significant central or obstructive sleep apnea. 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  Patient tells me he is having a relatively minor procedure to remove hardware or screw and perhaps will be done as an outpatient. Despite his back problems he is a vigorous man his exercise tolerance is greater than 5-7 METS no edema orthopnea chest pain palpitation or syncope he notes when he does vigorous garden work outdoors especially is not after extremities he will need to stop and rest more frequently than he used to but is not limited by shortness of breath. He has no known history of heart disease congenital or rheumatic. He has lost weight and his blood pressure is much better controlled He is asymptomatic with his peptic ulcer disease.  Preoperative cardiovascular evaluation summary Surgeon: Dr. Merrilee Seashore Procedure: Removal of hardware thoracic spine potentially outpatient surgery The surgery is elective Active cardiac problems no history of heart failure arrhythmia or coronary artery disease, he has well-controlled diabetes and hypertension..  The planned procedure is intermediate risk. The functional capacity is 4 mets or greater yes greater than 5-7 METS Recent cardiac tests performed EKG today is normal Given the above his overall risk for the planned procedure is acceptable low risk Antiplatelet/ anticoagulant recommendation: None he is not on anticoagulant or antiplatelet agent Other cardiac medication or device recommendation: None Anesthesia recommendation: None Observation, monitoring,and postoperative test recommendation: If kept in the hospital overnight please put in a monitored bed and check an EKG postoperative day 1 any problems contact heart care The patient is optimized from a cardiology perspective: Yes  His revised cardiac risk score  is quite low at 0, low risk  Past Medical History:  Diagnosis Date  . Acute pharyngitis 10/21/2013  . Allergy    grass and pollen  . Anxiety and depression 10/25/2011  . BPH (benign  prostatic hyperplasia) 04/23/2012  . Chicken pox as a child  . DDD (degenerative disc disease)    cervical responds to steroid injections and low back required surgery  . DDD (degenerative disc disease), lumbosacral   . Diabetes mellitus    pre  . Dyspnea   . ED (erectile dysfunction) 04/23/2012  . Elevated BP   . 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 Esophageal reflux 02/10/2015  . Fatigue   . HTN (hypertension)   . Hyperglycemia    preDM   . Hyperlipidemia   . Insomnia   . Low back pain radiating to both legs 01/16/2017  . Measles as a child  . Medicare annual wellness visit, subsequent 02/10/2015  . Overweight(278.02)   . Peripheral neuropathy 08/07/2019  . Personal history of colonic polyps 10/27/2012   Follows with [facility]  . Pneumonia    " walking"  . Preventative health care   . 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  . Wears glasses     Past Surgical History:  Procedure Laterality Date  . BACK SURGERY  2012 and 1994   Dr Margret Chance, screws and cage in low back  . BIOPSY  01/09/2020   Procedure: BIOPSY;  Surgeon: Otis Brace, MD;  Location: [facility];  Service: Gastroenterology;;  . COLONOSCOPY WITH PROPOFOL N/A 01/09/2020   Procedure: COLONOSCOPY WITH PROPOFOL;  Surgeon: Otis Brace, MD;  Location: [facility];  Service: Gastroenterology;  Laterality: N/A;  . ESOPHAGOGASTRODUODENOSCOPY (EGD) WITH PROPOFOL N/A 01/09/2020   Procedure: ESOPHAGOGASTRODUODENOSCOPY (EGD) WITH PROPOFOL;  Surgeon: Otis Brace, MD;  Location: [facility];  Service: Gastroenterology;  Laterality: N/A;  . EYE SURGERY Bilateral    2016  . HARDWARE REVISION  03/12/2019   REVISION OF HARDWARE THORACIC TEN-THORACIC ELEVEN WITH APPLICATION OF ADDITIONAL RODS AND ROD SLEEVES THORACIC EIGHT-THORACIC TWELVE 12-LUMBAR ONE (N/A )  . LAMINECTOMY  02/12/2019   DECOMPRESSIVE LAMINECTOMY THORACIC NINE-THORACIC TEN AND THORACIC TEN-THORACIC ELEVEN, EXTENSION OF THORACOLUMBAR FUSION FROM  THORACIC TEN TO THORACIC FIVE, LOCAL BONE GRAFT, ALLOGRAFT AND VIVIGEN (N/A)  . POLYPECTOMY  01/09/2020   Procedure: POLYPECTOMY;  Surgeon: Otis Brace, MD;  Location: [facility];  Service: Gastroenterology;;  . SPINAL FUSION N/A 02/12/2019   Procedure: DECOMPRESSIVE LAMINECTOMY THORACIC NINE-THORACIC TEN AND THORACIC TEN-THORACIC ELEVEN, EXTENSION OF THORACOLUMBAR FUSION FROM THORACIC TEN TO THORACIC FIVE, LOCAL BONE GRAFT, ALLOGRAFT AND VIVIGEN;  Surgeon: Jessy Oto, MD;  Location: [place];  Service: Orthopedics;  Laterality: N/A;  DECOMPRESSIVE LAMINECTOMY THORACIC NINE-THORACIC TEN AND THORACIC TEN-THORACIC ELEVEN, EXTENSION OF TH  . SPINAL FUSION N/A 03/12/2019   Procedure: REVISION OF HARDWARE THORACIC TEN-THORACIC ELEVEN WITH APPLICATION OF ADDITIONAL RODS AND ROD SLEEVES THORACIC EIGHT-THORACIC TWELVE 12-LUMBAR ONE;  Surgeon: Jessy Oto, MD;  Location: [place];  Service: Orthopedics;  Laterality: N/A;  . TONSILLECTOMY    . TONSILLECTOMY     as child  . torn rotator cuff  2010   right    Current Medications: Current Meds  Medication Sig  . atorvastatin (LIPITOR) 10 MG tablet TAKE 1 TABLET BY MOUTH EVERY DAY  . BAYER ASPIRIN EC LOW DOSE 81 MG EC tablet Take 81 mg by mouth every morning.  . busPIRone (BUSPAR) 7.5 MG tablet 1-2 TAB BY MOUTH 3 TIMES A DAY FOR ANXIETY (Patient taking differently: Take 7.5  mg by mouth 2 (two) times daily.)  . doxycycline (VIBRA-TABS) 100 MG tablet Take 1 tablet (100 mg total) by mouth 2 (two) times daily.  . ferrous fumarate (FERRETTS) 325 (106 Fe) MG TABS tablet TAKE 1 TABLET (106 MG OF IRON TOTAL) BY MOUTH DAILY.  Marland Kitchen glucose blood (CONTOUR NEXT TEST) test strip Check blood sugar once daily  . HYDROcodone-acetaminophen (NORCO/VICODIN) 5-325 MG tablet Take 1 tablet by mouth every 6 (six) hours as needed for moderate pain.  Marland Kitchen JANUVIA 50 MG tablet TAKE 1 TABLET BY MOUTH EVERY DAY  . lisinopril (ZESTRIL) 5 MG tablet TAKE 1 TABLET BY MOUTH TWICE A DAY   . metFORMIN (GLUCOPHAGE) 1000 MG tablet TAKE 1 TABLET (1,000 MG TOTAL) BY MOUTH 2 (TWO) TIMES DAILY WITH A MEAL.  . pantoprazole (PROTONIX) 40 MG tablet TAKE 1 TABLET BY MOUTH TWICE A DAY BEFORE A MEAL  . pregabalin (LYRICA) 75 MG capsule TAKE 1 CAPSULE BY MOUTH TWICE A DAY  . sucralfate (CARAFATE) 1 g tablet TAKE 1 TABLET (1 G TOTAL) BY MOUTH 4 (FOUR) TIMES DAILY - WITH MEALS AND AT BEDTIME.  . tamsulosin (FLOMAX) 0.4 MG CAPS capsule Take 2 capsules (0.8 mg total) by mouth daily.  Marland Kitchen testosterone cypionate (DEPOTESTOSTERONE CYPIONATE) 200 MG/ML injection Inject 1 mL (200 mg total) into the muscle every 14 (fourteen) days.  Marland Kitchen tiZANidine (ZANAFLEX) 4 MG tablet TAKE 1.5 TABLETS (6 MG TOTAL) BY MOUTH EVERY 6 (SIX) HOURS AS NEEDED FOR MUSCLE SPASMS.  Marland Kitchen venlafaxine XR (EFFEXOR-XR) 150 MG 24 hr capsule TAKE 1 CAPSULE (150 MG TOTAL) BY MOUTH DAILY WITH BREAKFAST.  Marland Kitchen zolpidem (AMBIEN) 5 MG tablet TAKE 1 TABLET BY MOUTH AT BEDTIME AS NEEDED FOR SLEEP.     Allergies:   Patient has no known allergies.   Social History   Socioeconomic History  . Marital status: Married    Spouse name: Not on file  . Number of children: Not on file  . Years of education: Not on file  . Highest education level: Not on file  Occupational History  . Not on file  Tobacco Use  . Smoking status: Former Smoker    Packs/day: 1.00    Years: 20.00    Pack years: 20.00    Types: Cigarettes    Quit date: 07/18/1989    Years since quitting: 31.0  . Smokeless tobacco: Never Used  . Tobacco comment: off and on smoking for the 20 yrs  Vaping Use  . Vaping Use: Never used  Substance and Sexual Activity  . Alcohol use: Yes    Comment: drink or two a day- mixed drink  . Drug use: No  . Sexual activity: Yes    Comment: lives with wife, still working, no dietary restrictions, continues to exercise intermittently  Other Topics Concern  . Not on file  Social History Narrative  . Not on file   Social Determinants of Health    Financial Resource Strain: Low Risk   . Difficulty of Paying Living Expenses: Not hard at all  Food Insecurity: Not on file  Transportation Needs: No Transportation Needs  . Lack of Transportation (Medical): No  . Lack of Transportation (Non-Medical): No  Physical Activity: Not on file  Stress: Not on file  Social Connections: Not on file     Family History: The patient's family history includes COPD in his father; Cancer (age of onset: 62) in his mother; Diabetes in his mother; Emphysema in his father; Heart disease in his daughter, father,  maternal grandfather, and paternal grandfather; Hyperlipidemia in his sister; Hypertension in his maternal grandmother, mother, and sister; Osteoporosis in his sister; Scoliosis in his maternal grandmother and sister; Stroke (age of onset: 36) in his father.  ROS:   ROS Please see the history of present illness.     All other systems reviewed and are negative.  EKGs/Labs/Other Studies Reviewed:    The following studies were reviewed today:   EKG:  EKG is  ordered today.  The ekg ordered today is personally reviewed and demonstrates sinus rhythm and normal  Recent Labs: 10/08/2019: TSH 3.42 07/01/2020: ALT 11; BUN 16; Creatinine, Ser 0.75; Hemoglobin 14.6; Platelets 330.0; Potassium 4.6; Sodium 133  Recent Lipid Panel    Component Value Date/Time   CHOL 112 07/01/2020 0857   TRIG 66.0 07/01/2020 0857   HDL 56.10 07/01/2020 0857   CHOLHDL 2 07/01/2020 0857   VLDL 13.2 07/01/2020 0857   LDLCALC 42 07/01/2020 0857   LDLDIRECT 64.0 10/30/2017 1502    Physical Exam:    VS:  BP 116/68   Pulse 75   Ht 5\' 10"  (1.778 m)   Wt 171 lb 1.3 oz (77.6 kg)   SpO2 97%   BMI 24.55 kg/m     Wt Readings from Last 3 Encounters:  07/21/20 171 lb 1.3 oz (77.6 kg)  06/29/20 174 lb (78.9 kg)  06/09/20 170 lb (77.1 kg)     GEN: He does not look chronically ill well nourished, well developed in no acute distress HEENT: Normal NECK: No JVD; No  carotid bruits LYMPHATICS: No lymphadenopathy CARDIAC: RRR, no murmurs, rubs, gallops RESPIRATORY:  Clear to auscultation without rales, wheezing or rhonchi  ABDOMEN: Soft, non-tender, non-distended MUSCULOSKELETAL:  No edema; No deformity  SKIN: Warm and dry NEUROLOGIC:  Alert and oriented x 3 PSYCHIATRIC:  Normal affect     Signed, Shirlee More, MD  07/21/2020 3:35 PM    [facility]

## 2020-07-21 ENCOUNTER — Ambulatory Visit (INDEPENDENT_AMBULATORY_CARE_PROVIDER_SITE_OTHER): Payer: Medicare Other

## 2020-07-21 ENCOUNTER — Other Ambulatory Visit: Payer: Self-pay | Admitting: *Deleted

## 2020-07-21 ENCOUNTER — Ambulatory Visit: Payer: Medicare Other | Admitting: Cardiology

## 2020-07-21 ENCOUNTER — Other Ambulatory Visit: Payer: Self-pay

## 2020-07-21 ENCOUNTER — Encounter: Payer: Self-pay | Admitting: Cardiology

## 2020-07-21 VITALS — BP 116/68 | HR 75 | Ht 70.0 in | Wt 171.1 lb

## 2020-07-21 DIAGNOSIS — E349 Endocrine disorder, unspecified: Secondary | ICD-10-CM

## 2020-07-21 DIAGNOSIS — Z01810 Encounter for preprocedural cardiovascular examination: Secondary | ICD-10-CM

## 2020-07-21 DIAGNOSIS — E119 Type 2 diabetes mellitus without complications: Secondary | ICD-10-CM

## 2020-07-21 DIAGNOSIS — E782 Mixed hyperlipidemia: Secondary | ICD-10-CM | POA: Diagnosis not present

## 2020-07-21 DIAGNOSIS — I10 Essential (primary) hypertension: Secondary | ICD-10-CM | POA: Diagnosis not present

## 2020-07-21 MED ORDER — MECLIZINE HCL 12.5 MG PO TABS: 12.5000 mg | ORAL_TABLET | Freq: Three times a day (TID) | ORAL | 0 refills | Status: DC | PRN

## 2020-07-21 MED ORDER — TESTOSTERONE CYPIONATE 200 MG/ML IM SOLN: 200.0000 mg | INTRAMUSCULAR | 0 refills | Status: DC

## 2020-07-21 NOTE — Progress Notes (Addendum)
Pt is here today for testosterone injection. Pt was given testosterone injection left glute. Pt tolerated well.  Esperanza Richters, PA-C

## 2020-07-21 NOTE — Telephone Encounter (Signed)
Patient needs refill on meclizine and testosterone  ORDERS HAVE BEEN PENDED, PLEASE CLICK INTO ENCOUNTER TO APPROVE  Testosterone Last written: 06/29/20 Last ov: 06/29/20 Next ov: n/a Contract:  UDS:

## 2020-07-21 NOTE — Telephone Encounter (Signed)
Rx testosterone and antivert sent to pt pharmacy.

## 2020-07-21 NOTE — Patient Instructions (Signed)

## 2020-07-29 ENCOUNTER — Ambulatory Visit: Payer: Medicare Other | Admitting: Infectious Disease

## 2020-08-10 ENCOUNTER — Encounter: Payer: Self-pay | Admitting: Medical

## 2020-08-10 ENCOUNTER — Telehealth: Payer: Self-pay | Admitting: Medical

## 2020-08-10 DIAGNOSIS — G4700 Insomnia, unspecified: Secondary | ICD-10-CM

## 2020-08-10 MED ORDER — ZOLPIDEM TARTRATE 5 MG PO TABS: 5.0000 mg | ORAL_TABLET | Freq: Every evening | ORAL | 0 refills | Status: DC | PRN

## 2020-08-10 NOTE — Telephone Encounter (Signed)
Referral to pulmonologist placed for insomnia.

## 2020-08-11 ENCOUNTER — Telehealth: Payer: Self-pay | Admitting: Medical

## 2020-08-11 ENCOUNTER — Other Ambulatory Visit: Payer: Self-pay

## 2020-08-11 ENCOUNTER — Ambulatory Visit (INDEPENDENT_AMBULATORY_CARE_PROVIDER_SITE_OTHER): Payer: Medicare Other

## 2020-08-11 DIAGNOSIS — E349 Endocrine disorder, unspecified: Secondary | ICD-10-CM | POA: Diagnosis not present

## 2020-08-11 MED ORDER — TESTOSTERONE CYPIONATE 200 MG/ML IM SOLN
200.0000 mg | INTRAMUSCULAR | Status: DC
  Administered 2020-08-11 – 2021-01-14 (×4): 200 mg via INTRAMUSCULAR

## 2020-08-11 NOTE — Progress Notes (Addendum)
Pt is here today for testosterone injection. Pt was given testosterone injection in Left Ventral Gluteal. Pt tolerated well.   PCP: Mackie Pai, PA-C   Waldo, Margie Ege, PA-C

## 2020-08-11 NOTE — Telephone Encounter (Signed)
Opened to review 

## 2020-08-17 ENCOUNTER — Ambulatory Visit (INDEPENDENT_AMBULATORY_CARE_PROVIDER_SITE_OTHER): Payer: Medicare Other | Admitting: Infectious Disease

## 2020-08-17 ENCOUNTER — Encounter: Payer: Self-pay | Admitting: Specialist

## 2020-08-17 ENCOUNTER — Encounter: Payer: Self-pay | Admitting: Infectious Disease

## 2020-08-17 ENCOUNTER — Other Ambulatory Visit: Payer: Self-pay

## 2020-08-17 VITALS — BP 143/74 | HR 68 | Temp 98.0°F | Ht 69.0 in | Wt 172.0 lb

## 2020-08-17 DIAGNOSIS — G062 Extradural and subdural abscess, unspecified: Secondary | ICD-10-CM | POA: Diagnosis not present

## 2020-08-17 DIAGNOSIS — K259 Gastric ulcer, unspecified as acute or chronic, without hemorrhage or perforation: Secondary | ICD-10-CM

## 2020-08-17 DIAGNOSIS — M4624 Osteomyelitis of vertebra, thoracic region: Secondary | ICD-10-CM | POA: Diagnosis not present

## 2020-08-17 DIAGNOSIS — F4321 Adjustment disorder with depressed mood: Secondary | ICD-10-CM

## 2020-08-17 DIAGNOSIS — T84498A Other mechanical complication of other internal orthopedic devices, implants and grafts, initial encounter: Secondary | ICD-10-CM

## 2020-08-17 NOTE — Progress Notes (Signed)
Subjective:  Chief complaint: Mid back pain t [name], male    DOB: 1945/09/09, 75 y.o.   MRN: [id number]  HPI  44 -year-old man with a history of degenerative disc disease and multiple spinal fusions who was initially seen by Dr. Prince Rome in our group.  He has a rather complex surgical history which in regards to his back began in 2015 Dr. Agapito Games performed an T8-L1 fusion to assist with left leg radicular pain and numbness.  Despite multiple visits to see his surgeon at [facility] postoperatively, the patient states that his left leg numbness and direct low thoracic back pain returned within months after his surgery.  Imaging in 2018 showed that he had an L5-S1 rod breakage and loosening of the pedicle screws at that level.  He sought a second opinion with Dr. Louanne Skye late in 2018.  In November 2018, he had a replacement of the L5-S1 fusion segment and bone grafting was performed simultaneously.  Per the operative note reviewed, the broken rod was left intact at that time.  He reports several partial or near falls following this procedure, but he denies any total falls or direct trauma to his back in the past several years.  As his back pain and left leg radicular symptoms gradually returned, further imaging was performed that suggested screw loosening at the T9-T11 section of his spinal fusion.  On February 12, 2019 he returned to the OR with Dr. Louanne Skye and was found to have screw loosening at the same levels, so his fusion was extended superiorly to T5 and bone grafting was performed.  Initially, the patient had a good postoperative course, but over weeks 2 and 3 postoperatively, he began developing pain in his back at his operative site, prompting plain films to be obtained.  X-rays showed a loss of fixation of the opposite left internal rod fastener at the site of his severe back pain and the development of acute loosening of the interval rod fasteners and loss of fixation.  On March 12, 2019, he was taken back to the operating room where it was noted that his Harrington rod had loosened and nearly entirely migrated out of position.  A culture was obtained near the pedicle screw at T10 as a small amount of soft tissue with significant bone osteomalacia was observed.  Given concern for his spinal stability, his recently placed Harrington rods were removed and revised/replaced.  Operative cultures were followed and remained negative at the time of his hospital discharge on March 14, 2019.  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    He was then placed on ceftriaxone in October 2020 with improvement in his symptoms and his inflammatory markers.  He completed his the ceftriaxone and changed over to doxycycline.  He saw Dr. Linus Salmons in February and seemed to be doing relatively well.  Since then he did have some episodes of feeling poorly and feeling flushed and feeling like "something not right I think I am sick".   Dr. 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 Extensive ventral and dorsal epidural thickening and enhancement around the cord from T4 there approximately T6 compatible with epidural infection. And probable osteomyelitis of T6.  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  Dr. Louanne Skye called me arrange for  the patient be seen in clinic   On exam he appeared stable and does not have any new neurological deficits.  Dr. Louanne Skye asked radiology to aspirate the disc for culture but they were unable to do so.  We obtained blood cultures and clinic and these were with no out any growth.   We r initiated him on ceftriaxone on April 6.  He was tolerating the antibiotics foot relatively well though he complains of some dark stools that he thinks might  be due to the antibiotics.  He has continued ceftriaxone and then changed over to doxycycline.  MRI of the spine ierformed on Dec 06, 2019 and I have reviewed and agree with the findings when they are official report is showing  T4-5 discitis osteomyelitis. Decreased conspicuity of ventral epidural abscess, epidural phlegmon and spinal canal narrowing at the T4-5 level.  T4-5 cord edema, unchanged.  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  Partially imaged posterior spinal fusion hardware. Please note lack of intravenous contrast and susceptibility artifact limits Evaluation.  Interim he developed profoundl fatigued and apparently had lost weight over the last several months.  He has some dizziness which seems to have preceded him being on doxycycline   We switched him from doxycycline to amoxicillin during that visit  Since then he was actually found to have significant gastric ulcers.  He is been taken off nonsteroidals and has not been drinking alcohol for the last 4 months.  On a PPI and Carafate  He has been putting back on weight and feeling much better his energy is improving appetite improving.  Still having some low back pain but this was much improved.  He was back on doxycycline instead of amoxicillin.  I saw him in August and we planned to have a repeat MRI which now was done this week.  Repeat MRI in April 23, 2020 shows:  Destructive changes at T4 and T5 that is progressed but overall seem to have improved in periods as far as his discitis and osteomyelitis in this region.  Dural abscess previously seen is completely resolved.  There is decreased marrow edema in the T6 vertebral body.  There is a fusion across the T9-T10 space where he previously had discitis and osteomyelitis.  Been found to have loosening hardware at T5 and T6 which Dr. Louanne Skye does not think is due to infection.  He would like to remove the loosened hardware which includes rods  and screws and apply a local bone graft and allograft.  The patient is anxious to come off antibiotics.  I was initially apprehensive about him coming off antibiotics because of concern that Dr. Louanne Skye might place hardware.  It sounds like that is not the plan but rather removal of hardware as planned.  Patient does continue to have mid back pain which can be severe at times up to 8-10 out of severity but he is able to get relief by changing position.  His peptic ulcer disease is improved with stopping nonsteroidals and he is cut out alcohol lately.  It is difficult for him to distinguish the pain he had from infection versus the pain he has from the hardware that is loose.  At our last visit our plan was to trial him off antibiotics if his inflammatory markers are reassuring, however he appears to have stayed on antibiotics even though inflammatory markers were normal.  Have told him today we will check the labs today but that he should leave and stop antibiotics and  that we will call him if we want him to start back on.  Is elective surgery with Dr. Louanne Skye has been delayed due to COVID-19.  I think having him off antibiotics for some period time before that surgery would be helpful and again if there is infection at the surgical screw site then Dr. Louanne Skye be able to get cultures in the operating room without the patient having been on prolonged months and months of antibiotics.   Patient also has recently lost his son who died of metastatic lung cancer that progressed rapidly after he was diagnosed with metastases to the brain.  His son was only 64 years of age.    Past Medical History:  Diagnosis Date  . Acute pharyngitis 10/21/2013  . Allergy    grass and pollen  . Anxiety and depression 10/25/2011  . BPH (benign prostatic hyperplasia) 04/23/2012  . Chicken pox as a child  . DDD (degenerative disc disease)    cervical responds to steroid injections and low back required surgery  . DDD  (degenerative disc disease), lumbosacral   . Diabetes mellitus    pre  . Dyspnea   . ED (erectile dysfunction) 04/23/2012  . Elevated BP   . 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 Esophageal reflux 02/10/2015  . Fatigue   . HTN (hypertension)   . Hyperglycemia    preDM   . Hyperlipidemia   . Insomnia   . Low back pain radiating to both legs 01/16/2017  . Measles as a child  . Medicare annual wellness visit, subsequent 02/10/2015  . Overweight(278.02)   . Peripheral neuropathy 08/07/2019  . Personal history of colonic polyps 10/27/2012   Follows with [facility]  . Pneumonia    " walking"  . Preventative health care   . 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  . Wears glasses     Past Surgical History:  Procedure Laterality Date  . BACK SURGERY  2012 and 1994   Dr Margret Chance, screws and cage in low back  . BIOPSY  01/09/2020   Procedure: BIOPSY;  Surgeon: Otis Brace, MD;  Location: [facility];  Service: Gastroenterology;;  . COLONOSCOPY WITH PROPOFOL N/A 01/09/2020   Procedure: COLONOSCOPY WITH PROPOFOL;  Surgeon: Otis Brace, MD;  Location: [facility];  Service: Gastroenterology;  Laterality: N/A;  . ESOPHAGOGASTRODUODENOSCOPY (EGD) WITH PROPOFOL N/A 01/09/2020   Procedure: ESOPHAGOGASTRODUODENOSCOPY (EGD) WITH PROPOFOL;  Surgeon: Otis Brace, MD;  Location: [facility];  Service: Gastroenterology;  Laterality: N/A;  . EYE SURGERY Bilateral    2016  . HARDWARE REVISION  03/12/2019   REVISION OF HARDWARE THORACIC TEN-THORACIC ELEVEN WITH APPLICATION OF ADDITIONAL RODS AND ROD SLEEVES THORACIC EIGHT-THORACIC TWELVE 12-LUMBAR ONE (N/A )  . LAMINECTOMY  02/12/2019   DECOMPRESSIVE LAMINECTOMY THORACIC NINE-THORACIC TEN AND THORACIC TEN-THORACIC ELEVEN, EXTENSION OF THORACOLUMBAR FUSION FROM THORACIC TEN TO THORACIC FIVE, LOCAL BONE GRAFT, ALLOGRAFT AND VIVIGEN (N/A)  . POLYPECTOMY  01/09/2020   Procedure: POLYPECTOMY;  Surgeon: Otis Brace, MD;  Location: [facility];  Service: Gastroenterology;;  . SPINAL FUSION N/A 02/12/2019   Procedure: DECOMPRESSIVE LAMINECTOMY THORACIC NINE-THORACIC TEN AND THORACIC TEN-THORACIC ELEVEN, EXTENSION OF THORACOLUMBAR FUSION FROM THORACIC TEN TO THORACIC FIVE, LOCAL BONE GRAFT, ALLOGRAFT AND VIVIGEN;  Surgeon: Jessy Oto, MD;  Location: [place];  Service: Orthopedics;  Laterality: N/A;  DECOMPRESSIVE LAMINECTOMY THORACIC NINE-THORACIC TEN AND THORACIC TEN-THORACIC ELEVEN, EXTENSION OF TH  . SPINAL FUSION N/A 03/12/2019   Procedure: REVISION OF HARDWARE THORACIC TEN-THORACIC ELEVEN WITH APPLICATION OF ADDITIONAL RODS AND ROD SLEEVES  THORACIC EIGHT-THORACIC TWELVE 12-LUMBAR ONE;  Surgeon: Jessy Oto, MD;  Location: [place];  Service: Orthopedics;  Laterality: N/A;  . TONSILLECTOMY    . TONSILLECTOMY     as child  . torn rotator cuff  2010   right    Family History  Problem Relation Age of Onset  . Hypertension Mother   . Diabetes Mother        type 2  . Cancer Mother 39       breast in remission  . Emphysema Father        smoker  . COPD Father        smoker  . Stroke Father 26       mini  . Heart disease Father   . Hypertension Sister   . Hyperlipidemia Sister   . Scoliosis Sister   . Osteoporosis Sister   . Hypertension Maternal Grandmother   . Scoliosis Maternal Grandmother   . Heart disease Maternal Grandfather   . Heart disease Paternal Grandfather        smoker  . Heart disease Daughter       Social History   Socioeconomic History  . Marital status: Married    Spouse name: Not on file  . Number of children: Not on file  . Years of education: Not on file  . Highest education level: Not on file  Occupational History  . Not on file  Tobacco Use  . Smoking status: Former Smoker    Packs/day: 1.00    Years: 20.00    Pack years: 20.00    Types: Cigarettes    Quit date: 07/18/1989    Years since quitting: 31.1  . Smokeless tobacco: Never Used  . Tobacco comment: off and on smoking  for the 20 yrs  Vaping Use  . Vaping Use: Never used  Substance and Sexual Activity  . Alcohol use: Yes    Comment: drink or two a day- mixed drink  . Drug use: No  . Sexual activity: Yes    Comment: lives with wife, still working, no dietary restrictions, continues to exercise intermittently  Other Topics Concern  . Not on file  Social History Narrative  . Not on file   Social Determinants of Health   Financial Resource Strain: Low Risk   . Difficulty of Paying Living Expenses: Not hard at all  Food Insecurity: Not on file  Transportation Needs: No Transportation Needs  . Lack of Transportation (Medical): No  . Lack of Transportation (Non-Medical): No  Physical Activity: Not on file  Stress: Not on file  Social Connections: Not on file    No Known Allergies   Current Outpatient Medications:  .  acyclovir (ZOVIRAX) 200 MG capsule, , Disp: , Rfl:  .  atorvastatin (LIPITOR) 10 MG tablet, TAKE 1 TABLET BY MOUTH EVERY DAY, Disp: 90 tablet, Rfl: 1 .  BAYER ASPIRIN EC LOW DOSE 81 MG EC tablet, Take 81 mg by mouth every morning., Disp: , Rfl:  .  busPIRone (BUSPAR) 7.5 MG tablet, 1-2 TAB BY MOUTH 3 TIMES A DAY FOR ANXIETY (Patient taking differently: Take 7.5 mg by mouth 2 (two) times daily.), Disp: 540 tablet, Rfl: 1 .  cetirizine (ZYRTEC) 10 MG tablet, Take 10 mg by mouth daily. In the morning, Disp: , Rfl:  .  doxycycline (VIBRA-TABS) 100 MG tablet, Take 1 tablet (100 mg total) by mouth 2 (two) times daily., Disp: 60 tablet, Rfl: 5 .  ferrous fumarate (FERRETTS) 325 (106 Fe)  MG TABS tablet, TAKE 1 TABLET (106 MG OF IRON TOTAL) BY MOUTH DAILY., Disp: 30 tablet, Rfl: 2 .  glucose blood (CONTOUR NEXT TEST) test strip, Check blood sugar once daily, Disp: 100 each, Rfl: 12 .  HYDROcodone-acetaminophen (NORCO/VICODIN) 5-325 MG tablet, Take 1 tablet by mouth every 6 (six) hours as needed for moderate pain., Disp: 30 tablet, Rfl: 0 .  JANUVIA 50 MG tablet, TAKE 1 TABLET BY MOUTH EVERY  DAY, Disp: 30 tablet, Rfl: 3 .  lisinopril (ZESTRIL) 5 MG tablet, TAKE 1 TABLET BY MOUTH TWICE A DAY, Disp: 180 tablet, Rfl: 1 .  meclizine (ANTIVERT) 12.5 MG tablet, Take 1 tablet (12.5 mg total) by mouth 3 (three) times daily as needed for dizziness., Disp: 30 tablet, Rfl: 0 .  metFORMIN (GLUCOPHAGE) 1000 MG tablet, TAKE 1 TABLET (1,000 MG TOTAL) BY MOUTH 2 (TWO) TIMES DAILY WITH A MEAL., Disp: 180 tablet, Rfl: 0 .  pantoprazole (PROTONIX) 40 MG tablet, TAKE 1 TABLET BY MOUTH TWICE A DAY BEFORE A MEAL, Disp: 180 tablet, Rfl: 1 .  pregabalin (LYRICA) 75 MG capsule, TAKE 1 CAPSULE BY MOUTH TWICE A DAY, Disp: 60 capsule, Rfl: 3 .  sildenafil (REVATIO) 20 MG tablet, TAKE ONE TO THREE TABLETS BY MOUTH DAILY AS NEEDED, Disp: 50 tablet, Rfl: 1 .  sucralfate (CARAFATE) 1 g tablet, TAKE 1 TABLET (1 G TOTAL) BY MOUTH 4 (FOUR) TIMES DAILY - WITH MEALS AND AT BEDTIME., Disp: 120 tablet, Rfl: 2 .  tamsulosin (FLOMAX) 0.4 MG CAPS capsule, Take 2 capsules (0.8 mg total) by mouth daily., Disp: 180 capsule, Rfl: 3 .  testosterone cypionate (DEPOTESTOSTERONE CYPIONATE) 200 MG/ML injection, Inject 1 mL (200 mg total) into the muscle every 14 (fourteen) days., Disp: 2 mL, Rfl: 0 .  tiZANidine (ZANAFLEX) 4 MG tablet, TAKE 1.5 TABLETS (6 MG TOTAL) BY MOUTH EVERY 6 (SIX) HOURS AS NEEDED FOR MUSCLE SPASMS., Disp: 540 tablet, Rfl: 1 .  venlafaxine XR (EFFEXOR-XR) 150 MG 24 hr capsule, TAKE 1 CAPSULE (150 MG TOTAL) BY MOUTH DAILY WITH BREAKFAST., Disp: 90 capsule, Rfl: 1 .  zolpidem (AMBIEN) 5 MG tablet, Take 1 tablet (5 mg total) by mouth at bedtime as needed. for sleep, Disp: 30 tablet, Rfl: 0  Current Facility-Administered Medications:  .  testosterone cypionate (DEPOTESTOSTERONE CYPIONATE) injection 200 mg, 200 mg, Intramuscular, Q14 Days, Saguier, Edward, PA-C, 200 mg at 08/11/20 1028   Review of Systems  Constitutional: Negative for activity change, appetite change, chills, fever and unexpected weight change.   HENT: Negative for congestion, rhinorrhea, sinus pressure, sneezing, sore throat and trouble swallowing.   Eyes: Negative for photophobia and visual disturbance.  Respiratory: Negative for cough, chest tightness, shortness of breath, wheezing and stridor.   Cardiovascular: Negative for chest pain, palpitations and leg swelling.  Gastrointestinal: Negative for abdominal distention, abdominal pain, anal bleeding, blood in stool, constipation, diarrhea, nausea and vomiting.  Genitourinary: Negative for difficulty urinating, dysuria, flank pain and hematuria.  Musculoskeletal: Positive for arthralgias and back pain. Negative for gait problem, joint swelling and myalgias.  Skin: Negative for color change, pallor, rash and wound.  Neurological: Negative for dizziness, tremors, weakness and light-headedness.  Hematological: Negative for adenopathy. Does not bruise/bleed easily.  Psychiatric/Behavioral: Negative for agitation, behavioral problems, confusion, decreased concentration and sleep disturbance.       Objective:   Physical Exam Constitutional:      General: He is not in acute distress.    Appearance: Normal appearance. He is well-developed. He is not ill-appearing  or diaphoretic.  HENT:     Head: Normocephalic and atraumatic.     Right Ear: Hearing and external ear normal.     Left Ear: Hearing and external ear normal.     Nose: No nasal deformity or rhinorrhea.  Eyes:     General: No scleral icterus.    Conjunctiva/sclera: Conjunctivae normal.     Right eye: Right conjunctiva is not injected.     Left eye: Left conjunctiva is not injected.  Neck:     Vascular: No JVD.  Cardiovascular:     Rate and Rhythm: Normal rate and regular rhythm.     Heart sounds: S1 normal and S2 normal. No friction rub.  Pulmonary:     Effort: Pulmonary effort is normal. No respiratory distress.  Abdominal:     General: There is no distension.     Palpations: Abdomen is soft.  Musculoskeletal:         General: Normal range of motion.     Right shoulder: Normal.     Left shoulder: Normal.     Cervical back: Normal range of motion and neck supple.     Right hip: Normal.     Left hip: Normal.     Right knee: Normal.     Left knee: Normal.  Lymphadenopathy:     Head:     Right side of head: No submandibular, preauricular or posterior auricular adenopathy.     Left side of head: No submandibular, preauricular or posterior auricular adenopathy.     Cervical: No cervical adenopathy.     Right cervical: No superficial or deep cervical adenopathy.    Left cervical: No superficial or deep cervical adenopathy.  Skin:    General: Skin is warm and dry.     Coloration: Skin is not pale.     Findings: No abrasion, bruising, ecchymosis, erythema, lesion or rash.     Nails: There is no clubbing.  Neurological:     General: No focal deficit present.     Mental Status: He is alert and oriented to person, place, and time.     Sensory: No sensory deficit.     Coordination: Coordination normal.     Gait: Gait normal.  Psychiatric:        Attention and Perception: Attention and perception normal. He is attentive. He does not perceive auditory hallucinations.        Mood and Affect: Mood and affect normal. Mood is not depressed.        Speech: Speech normal.        Behavior: Behavior normal. Behavior is cooperative.        Thought Content: Thought content normal.        Judgment: Judgment normal.           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  As mentioned there is loosening of hardware superior to this area that Dr. Louanne Skye is considering removing.  Since there is not a plan of placing hardware in place I think it is reasonable if the patient's  inflammatory markers look encouraging to stop antibiotics and observe him off antibiotics.  We are making sure he comes off this visit and again only will put him back on if they are markedly elevated.  Close eye on  his pain as well.  He will come back to see [place] in 2 months time  Then if Dr. Louanne Skye goes into the operating room and remove the hardware and it appears infected he can certainly culture the area.   Gastric ulcers: These seem to be resolving.  Grief: Clearly loss of his 50 year old son with sudden and traumatic.  He says his faith is helping him to feel that his son is in a better place.

## 2020-08-18 LAB — CBC WITH DIFFERENTIAL/PLATELET
Absolute Monocytes: 734 cells/uL (ref 200–950)
Basophils Absolute: 29 cells/uL (ref 0–200)
Basophils Relative: 0.4 %
Eosinophils Absolute: 122 cells/uL (ref 15–500)
Eosinophils Relative: 1.7 %
HCT: 40.9 % (ref 38.5–50.0)
Hemoglobin: 14.1 g/dL (ref 13.2–17.1)
Lymphs Abs: 720 cells/uL — ABNORMAL LOW (ref 850–3900)
MCH: 32.6 pg (ref 27.0–33.0)
MCHC: 34.5 g/dL (ref 32.0–36.0)
MCV: 94.5 fL (ref 80.0–100.0)
MPV: 8.9 fL (ref 7.5–12.5)
Monocytes Relative: 10.2 %
Neutro Abs: 5594 cells/uL (ref 1500–7800)
Neutrophils Relative %: 77.7 %
Platelets: 317 10*3/uL (ref 140–400)
RBC: 4.33 10*6/uL (ref 4.20–5.80)
RDW: 12.4 % (ref 11.0–15.0)
Total Lymphocyte: 10 %
WBC: 7.2 10*3/uL (ref 3.8–10.8)

## 2020-08-18 LAB — COMPLETE METABOLIC PANEL WITH GFR
AG Ratio: 2.1 (calc) (ref 1.0–2.5)
ALT: 7 U/L — ABNORMAL LOW (ref 9–46)
AST: 18 U/L (ref 10–35)
Albumin: 4.5 g/dL (ref 3.6–5.1)
Alkaline phosphatase (APISO): 77 U/L (ref 35–144)
BUN: 13 mg/dL (ref 7–25)
CO2: 30 mmol/L (ref 20–32)
Calcium: 9.6 mg/dL (ref 8.6–10.3)
Chloride: 96 mmol/L — ABNORMAL LOW (ref 98–110)
Creat: 0.78 mg/dL (ref 0.70–1.18)
GFR, Est African American: 102 mL/min/{1.73_m2} (ref >=60)
GFR, Est Non African American: 88 mL/min/{1.73_m2} (ref >=60)
Globulin: 2.1 g/dL (calc) (ref 1.9–3.7)
Glucose, Bld: 98 mg/dL (ref 65–99)
Potassium: 4.7 mmol/L (ref 3.5–5.3)
Sodium: 133 mmol/L — ABNORMAL LOW (ref 135–146)
Total Bilirubin: 1 mg/dL (ref 0.2–1.2)
Total Protein: 6.6 g/dL (ref 6.1–8.1)

## 2020-08-18 LAB — C-REACTIVE PROTEIN: CRP: 11.2 mg/L — ABNORMAL HIGH (ref <8.0)

## 2020-08-18 LAB — SEDIMENTATION RATE: Sed Rate: 2 mm/h (ref 0–20)

## 2020-08-26 ENCOUNTER — Ambulatory Visit (INDEPENDENT_AMBULATORY_CARE_PROVIDER_SITE_OTHER): Payer: Medicare Other

## 2020-08-26 ENCOUNTER — Other Ambulatory Visit: Payer: Self-pay | Admitting: Medical

## 2020-08-26 ENCOUNTER — Other Ambulatory Visit: Payer: Self-pay

## 2020-08-26 DIAGNOSIS — E349 Endocrine disorder, unspecified: Secondary | ICD-10-CM | POA: Diagnosis not present

## 2020-08-26 MED ORDER — TESTOSTERONE CYPIONATE 200 MG/ML IM SOLN: 200.0000 mg | INTRAMUSCULAR | 0 refills | Status: DC

## 2020-08-26 NOTE — Telephone Encounter (Signed)
Requesting: testosterone 200mg /mL Contract: n/a UDS: n/a Last Visit: 06/29/2020 Next Visit: None Last Refill: 07/21/2020 #20mL and 0RF  Please Advise

## 2020-08-26 NOTE — Progress Notes (Addendum)
Patient here today for testosterone injection per Mackie Pai PA-C. 62mL given in left upper outer quadrant. Patient tolerated well. Next injection scheduled for 16 days from now.   Mackie Pai, PA-C

## 2020-08-26 NOTE — Telephone Encounter (Signed)
I did refill his testosterone. Thanks.

## 2020-09-03 ENCOUNTER — Encounter: Payer: Self-pay | Admitting: Specialist

## 2020-09-03 ENCOUNTER — Other Ambulatory Visit: Payer: Self-pay

## 2020-09-03 ENCOUNTER — Ambulatory Visit: Payer: Medicare Other | Admitting: Specialist

## 2020-09-03 VITALS — BP 127/71 | HR 74 | Ht 69.0 in | Wt 172.0 lb

## 2020-09-03 DIAGNOSIS — T8484XA Pain due to internal orthopedic prosthetic devices, implants and grafts, initial encounter: Secondary | ICD-10-CM

## 2020-09-03 DIAGNOSIS — M4624 Osteomyelitis of vertebra, thoracic region: Secondary | ICD-10-CM | POA: Diagnosis not present

## 2020-09-03 DIAGNOSIS — M40294 Other kyphosis, thoracic region: Secondary | ICD-10-CM | POA: Diagnosis not present

## 2020-09-03 DIAGNOSIS — Z981 Arthrodesis status: Secondary | ICD-10-CM | POA: Diagnosis not present

## 2020-09-03 DIAGNOSIS — M960 Pseudarthrosis after fusion or arthrodesis: Secondary | ICD-10-CM | POA: Diagnosis not present

## 2020-09-03 NOTE — Patient Instructions (Signed)
Plan: Avoid bending, stooping and avoid lifting weights greater than 10 lbs. Avoid prolong standing and walking. Order for a new walker with wheels. Surgery scheduling secretary Kandice Hams, will call you in the next week to schedule for surgery.  Surgery recommended is a removal of loosened hardware at T5 and T6 this would be done with removal of rods and screws with application of local bone graft and allograft (donor bone graft). Risk of surgery includes risk of infection 1 in 200 patients, bleeding 1/2% chance you would need a transfusion.   Risk to the nerves is one in 10,000.  Expect improved walking and standing tolerance. Expect relief of leg pain but numbness may persist depending on the length and degree of pressure that has been present.   Follow-Up Instructions: Return in about 4 weeks (around 06/05/2020).

## 2020-09-03 NOTE — Progress Notes (Signed)
Office Visit Note   Patient: Luis Brown           Date of Birth: [birth year]           MRN: [id number] Visit Date: 09/03/2020              Requested by: Luis Pai, PA-C [address],  [place] [address] PCP: Luis Pai, PA-C   Assessment & Plan: Visit Diagnoses:  1. Painful orthopaedic hardware ([place])   2. Other kyphosis, thoracic region   3. Pseudarthrosis after fusion or arthrodesis   4. 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HCC)   5. Status post thoracic spinal fusion     Plan: Plan: Avoid bending, stooping and avoid lifting weights greater than 10 lbs. Avoid prolong standing and walking. Order for a new walker with wheels. Surgery scheduling secretary Kandice Hams, will call you in the next week to schedule for surgery.  Surgery recommended is a removal of loosened hardware at T5 and T6 this would be done with removal of rods and screws with application of local bone graft and allograft (donor bone graft). Risk of surgery includes risk of infection 1 in 200 patients, bleeding 1/2% chance you would need a transfusion.   Risk to the nerves is one in 10,000.  Expect improved walking and standing tolerance. Expect relief of leg pain but numbness may persist depending on the length and degree of pressure that has been present.   Follow-Up Instructions: Return in about 4 weeks (around 06/05/2020).   Follow-Up Instructions: Return in about 4 weeks (around 10/01/2020).   Orders:  No orders of the defined types were placed in this encounter.  No orders of the defined types were placed in this encounter.     Procedures: No procedures performed   Clinical Data: No additional findings.   Subjective: Chief Complaint  Patient presents with  . Spine - Pain, Follow-up    Pre-op for surgery on 09/14/20    75 year old male with history of extension of lumbar fusion to the T6. Post op he had hardware loosening and returned to the  OR for revision with culturing of the T10-11 level which grew propriobacterium acnes. He was started on antibiotic Iv for several months then the antibiotics were changed to oral and follow up MRI showed a discitis at the level above the Upper pedicle screws T5-6. His hardware is showing signs of migration superiorly with fracture of the upper pedicle screws. He has pain that localizes to the T6 level and due to ongoing pain associated with the loosening of the upper pedicle screws. I recommend removal of the upper screws and repeat cultures. No further extension of fusions     Review of Systems  Constitutional: Negative.   HENT: Negative.   Eyes: Negative.   Respiratory: Negative.   Cardiovascular: Negative.   Gastrointestinal: Negative.   Endocrine: Negative.   Genitourinary: Negative.   Musculoskeletal: Positive for back pain and gait problem.  Skin: Negative.   Allergic/Immunologic: Negative.   Hematological: Negative.   Psychiatric/Behavioral: Negative.      Objective: Vital Signs: BP 127/71 (BP Location: Left Arm, Patient Position: Sitting)   Pulse 74   Ht 5\' 9"  (1.753 m)   Wt 172 lb (78 kg)   BMI 25.40 kg/m   Physical Exam Constitutional:      Appearance: He is well-developed and well-nourished.  HENT:     Head: Normocephalic and atraumatic.  Eyes:  Extraocular Movements: EOM normal.     Pupils: Pupils are equal, round, and reactive to light.  Pulmonary:     Effort: Pulmonary effort is normal.     Breath sounds: Normal breath sounds.  Abdominal:     General: Bowel sounds are normal.     Palpations: Abdomen is soft.  Musculoskeletal:     Cervical back: Normal range of motion and neck supple.     Lumbar back: Negative right straight leg raise test and negative left straight leg raise test.  Skin:    General: Skin is warm and dry.  Neurological:     Mental Status: He is alert and oriented to person, place, and time.  Psychiatric:        Mood and Affect: Mood  and affect normal.        Behavior: Behavior normal.        Thought Content: Thought content normal.        Judgment: Judgment normal.     Back Exam   Tenderness  The patient is experiencing tenderness in the lumbar.  Range of Motion  Extension: abnormal  Flexion: normal  Lateral bend right: normal  Lateral bend left: normal  Rotation right: normal  Rotation left: normal   Muscle Strength  Right Quadriceps:  5/5  Left Quadriceps:  5/5  Right Hamstrings:  5/5  Left Hamstrings:  5/5   Tests  Straight leg raise right: negative Straight leg raise left: negative  Other  Toe walk: normal Heel walk: normal Sensation: normal Gait: normal       Specialty Comments:  No specialty comments available.  Imaging: No results found.   PMFS History: Patient Active Problem List   Diagnosis Date Noted  . Hyperglycemia 03/22/2020  . Gastric ulcer 01/27/2020  . H/O colonoscopy with polypectomy 01/27/2020  . Protein-calorie malnutrition, severe 01/09/2020  . Weight loss 01/07/2020  . Abdominal pain 12/30/2019  . Nausea and vomiting 12/30/2019  . Weight loss, abnormal 12/30/2019  . Change in bowel habits 12/30/2019  . 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 Medication monitoring encounter 08/26/2019  . Peripheral neuropathy 08/07/2019  . Alcohol abuse 05/09/2019  . Hyponatremia 05/05/2019  . 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[place]) 04/04/2019  . Fusion of spine of thoracolumbar region 03/12/2019  . Status post thoracic spinal fusion 02/12/2019  . 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 Thoracic spinal stenosis 04/17/2017  . Painful orthopaedic hardware ([place]) 04/17/2017  . 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 ([place]) 04/17/2017  . Low back pain radiating to both legs 01/16/2017  . Esophageal reflux 02/10/2015  . Medicare annual wellness visit, subsequent 02/10/2015  . Personal history  of colonic polyps 10/27/2012  . 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  . BPH (benign prostatic hyperplasia) 04/23/2012  . ED (erectile dysfunction) 04/23/2012  . Allergic state 10/25/2011  . Anxiety and depression 10/25/2011  . Asthma   . Diabetes mellitus without complication ([place])   . Hyperlipidemia   . Overweight   . Insomnia   . Fatigue   . Preventative health care   . DDD (degenerative disc disease), lumbosacral   . Thoracic degenerative disc disease   . HTN (hypertension)    Past Medical History:  Diagnosis Date  . Acute pharyngitis 10/21/2013  . Allergy    grass and pollen  . Anxiety and depression 10/25/2011  . BPH (benign prostatic hyperplasia) 04/23/2012  . Chicken pox as a child  . DDD (degenerative  disc disease)    cervical responds to steroid injections and low back required surgery  . DDD (degenerative disc disease), lumbosacral   . Diabetes mellitus    pre  . Dyspnea   . ED (erectile dysfunction) 04/23/2012  . Elevated BP   . 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 Esophageal reflux 02/10/2015  . Fatigue   . HTN (hypertension)   . Hyperglycemia    preDM   . Hyperlipidemia   . Insomnia   . Low back pain radiating to both legs 01/16/2017  . Measles as a child  . Medicare annual wellness visit, subsequent 02/10/2015  . Overweight(278.02)   . Peripheral neuropathy 08/07/2019  . Personal history of colonic polyps 10/27/2012   Follows with [facility]  . Pneumonia    " walking"  . Preventative health care   . 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  . Wears glasses     Family History  Problem Relation Age of Onset  . Hypertension Mother   . Diabetes Mother        type 2  . Cancer Mother 60       breast in remission  . Emphysema Father        smoker  . COPD Father        smoker  . Stroke Father 72       mini  . Heart disease Father   . Hypertension Sister   . Hyperlipidemia Sister   . Scoliosis Sister   . Osteoporosis Sister   . Hypertension Maternal  Grandmother   . Scoliosis Maternal Grandmother   . Heart disease Maternal Grandfather   . Heart disease Paternal Grandfather        smoker  . Heart disease Daughter     Past Surgical History:  Procedure Laterality Date  . BACK SURGERY  2012 and 1994   Dr Margret Chance, screws and cage in low back  . BIOPSY  01/09/2020   Procedure: BIOPSY;  Surgeon: Otis Brace, MD;  Location: [facility];  Service: Gastroenterology;;  . COLONOSCOPY WITH PROPOFOL N/A 01/09/2020   Procedure: COLONOSCOPY WITH PROPOFOL;  Surgeon: Otis Brace, MD;  Location: [facility];  Service: Gastroenterology;  Laterality: N/A;  . ESOPHAGOGASTRODUODENOSCOPY (EGD) WITH PROPOFOL N/A 01/09/2020   Procedure: ESOPHAGOGASTRODUODENOSCOPY (EGD) WITH PROPOFOL;  Surgeon: Otis Brace, MD;  Location: [facility];  Service: Gastroenterology;  Laterality: N/A;  . EYE SURGERY Bilateral    2016  . HARDWARE REVISION  03/12/2019   REVISION OF HARDWARE THORACIC TEN-THORACIC ELEVEN WITH APPLICATION OF ADDITIONAL RODS AND ROD SLEEVES THORACIC EIGHT-THORACIC TWELVE 12-LUMBAR ONE (N/A )  . LAMINECTOMY  02/12/2019   DECOMPRESSIVE LAMINECTOMY THORACIC NINE-THORACIC TEN AND THORACIC TEN-THORACIC ELEVEN, EXTENSION OF THORACOLUMBAR FUSION FROM THORACIC TEN TO THORACIC FIVE, LOCAL BONE GRAFT, ALLOGRAFT AND VIVIGEN (N/A)  . POLYPECTOMY  01/09/2020   Procedure: POLYPECTOMY;  Surgeon: Otis Brace, MD;  Location: [facility];  Service: Gastroenterology;;  . SPINAL FUSION N/A 02/12/2019   Procedure: DECOMPRESSIVE LAMINECTOMY THORACIC NINE-THORACIC TEN AND THORACIC TEN-THORACIC ELEVEN, EXTENSION OF THORACOLUMBAR FUSION FROM THORACIC TEN TO THORACIC FIVE, LOCAL BONE GRAFT, ALLOGRAFT AND VIVIGEN;  Surgeon: Jessy Oto, MD;  Location: [place];  Service: Orthopedics;  Laterality: N/A;  DECOMPRESSIVE LAMINECTOMY THORACIC NINE-THORACIC TEN AND THORACIC TEN-THORACIC ELEVEN, EXTENSION OF TH  . SPINAL FUSION N/A 03/12/2019   Procedure: REVISION OF  HARDWARE THORACIC TEN-THORACIC ELEVEN WITH APPLICATION OF ADDITIONAL RODS AND ROD SLEEVES THORACIC EIGHT-THORACIC TWELVE 12-LUMBAR ONE;  Surgeon: Jessy Oto, MD;  Location: [place];  Service:  Orthopedics;  Laterality: N/A;  . TONSILLECTOMY    . TONSILLECTOMY     as child  . torn rotator cuff  2010   right   Social History   Occupational History  . Not on file  Tobacco Use  . Smoking status: Former Smoker    Packs/day: 1.00    Years: 20.00    Pack years: 20.00    Types: Cigarettes    Quit date: 07/18/1989    Years since quitting: 31.1  . Smokeless tobacco: Never Used  . Tobacco comment: off and on smoking for the 20 yrs  Vaping Use  . Vaping Use: Never used  Substance and Sexual Activity  . Alcohol use: Yes    Comment: drink or two a day- mixed drink  . Drug use: No  . Sexual activity: Yes    Comment: lives with wife, still working, no dietary restrictions, continues to exercise intermittently

## 2020-09-07 ENCOUNTER — Encounter: Payer: Self-pay | Admitting: Medical

## 2020-09-07 NOTE — Progress Notes (Signed)
Surgical Instructions   Your procedure is scheduled on Monday, February 28th.  Report to [facility] Main Entrance "A" at 5:30 A.M., then check in with the Admitting office.  Call this number if you have problems the morning of surgery:  [phone]   If you have any questions prior to your surgery date call [phone]: Open Monday-Friday 8am-4pm   Remember:  Do not eat or drink after midnight the night before your surgery    Take these medicines the morning of surgery with A SIP OF WATER  atorvastatin (LIPITOR)  busPIRone (BUSPAR) cetirizine (ZYRTEC)  pantoprazole (PROTONIX)  pregabalin (LYRICA) tamsulosin (FLOMAX) venlafaxine XR (EFFEXOR-XR)   If needed: HYDROcodone-acetaminophen (NORCO/VICODIN), meclizine (ANTIVERT), tiZANidine (ZANAFLEX)     As of today, STOP taking any Aspirin (unless otherwise instructed by your surgeon) Aleve, Naproxen, Ibuprofen, Motrin, Advil, Goody's, BC's, all herbal medications, fish oil, and all vitamins.          WHAT DO I DO ABOUT MY DIABETES MEDICATION?  . Do NOT take JANUVIA or metFORMIN (GLUCOPHAGE)/oral diabetes medicines (pills) the morning of surgery.  HOW TO MANAGE YOUR DIABETES BEFORE AND AFTER SURGERY  Why is it important to control my blood sugar before and after surgery? . Improving blood sugar levels before and after surgery helps healing and can limit problems. . A way of improving blood sugar control is eating a healthy diet by: o  Eating less sugar and carbohydrates o  Increasing activity/exercise o  Talking with your doctor about reaching your blood sugar goals . High blood sugars (greater than 180 mg/dL) can raise your risk of infections and slow your recovery, so you will need to focus on controlling your diabetes during the weeks before surgery. . Make sure that the doctor who takes care of your diabetes knows about your planned surgery including the date and location.  How do I manage my blood sugar before  surgery? . Check your blood sugar at least 4 times a day, starting 2 days before surgery, to make sure that the level is not too high or low. . Check your blood sugar the morning of your surgery when you wake up and every 2 hours until you get to the Short Stay unit. o If your blood sugar is less than 70 mg/dL, you will need to treat for low blood sugar: - Do not take insulin. - Treat a low blood sugar (less than 70 mg/dL) with  cup of clear juice (cranberry or apple), 4 glucose tablets, OR glucose gel. - Recheck blood sugar in 15 minutes after treatment (to make sure it is greater than 70 mg/dL). If your blood sugar is not greater than 70 mg/dL on recheck, call [phone] for further instructions. . Report your blood sugar to the short stay nurse when you get to Short Stay.  . If you are admitted to the hospital after surgery: o Your blood sugar will be checked by the staff and you will probably be given insulin after surgery (instead of oral diabetes medicines) to make sure you have good blood sugar levels. o The goal for blood sugar control after surgery is 80-180 mg/dL.             Do not wear jewelry.            Do not wear lotions, powders,colognes, or deodorant.            Men may shave face and neck.  Do not bring valuables to the hospital.            [facility] is not responsible for any belongings or valuables.  Do NOT Smoke (Tobacco/Vaping) or drink Alcohol 24 hours prior to your procedure If you use a CPAP at night, you may bring all equipment for your overnight stay.   Contacts, glasses, dentures or bridgework may not be worn into surgery, please bring cases for these belongings   For patients admitted to the hospital, discharge time will be determined by your treatment team.   Patients discharged the day of surgery will not be allowed to drive home, and someone needs to stay with them for 24 hours.  Special instructions:   [place]- Preparing For  Surgery  Before surgery, you can play an important role. Because skin is not sterile, your skin needs to be as free of germs as possible. You can reduce the number of germs on your skin by washing with CHG (chlorahexidine gluconate) Soap before surgery.  CHG is an antiseptic cleaner which kills germs and bonds with the skin to continue killing germs even after washing.    Oral Hygiene is also important to reduce your risk of infection.  Remember - BRUSH YOUR TEETH THE MORNING OF SURGERY WITH YOUR REGULAR TOOTHPASTE  Please do not use if you have an allergy to CHG or antibacterial soaps. If your skin becomes reddened/irritated stop using the CHG.  Do not shave (including legs and underarms) for at least 48 hours prior to first CHG shower. It is OK to shave your face.  Please follow these instructions carefully.   1. Shower the NIGHT BEFORE SURGERY and the MORNING OF SURGERY  2. If you chose to wash your hair, wash your hair first as usual with your normal shampoo.  3. After you shampoo, rinse your hair and body thoroughly to remove the shampoo.  4. Wash Face and genitals (private parts) with your normal soap.   5.  Shower the NIGHT BEFORE SURGERY and the MORNING OF SURGERY with CHG Soap.   6. Use CHG Soap as you would any other liquid soap. You can apply CHG directly to the skin and wash gently with a scrungie or a clean washcloth.   7. Apply the CHG Soap to your body ONLY FROM THE NECK DOWN.  Do not use on open wounds or open sores. Avoid contact with your eyes, ears, mouth and genitals (private parts). Wash Face and genitals (private parts)  with your normal soap.   8. Wash thoroughly, paying special attention to the area where your surgery will be performed.  9. Thoroughly rinse your body with warm water from the neck down.  10. DO NOT shower/wash with your normal soap after using and rinsing off the CHG Soap.  11. Pat yourself dry with a CLEAN TOWEL.  12. Wear CLEAN PAJAMAS to bed  the night before surgery  13. Place CLEAN SHEETS on your bed the night before your surgery  14. DO NOT SLEEP WITH PETS.  Day of Surgery: Shower with CHG soap Wear Clean/Comfortable clothing the morning of surgery Do not apply any deodorants/lotions.   Remember to brush your teeth WITH YOUR REGULAR TOOTHPASTE.   Please read over the following fact sheets that you were given.

## 2020-09-08 ENCOUNTER — Encounter: Payer: Self-pay | Admitting: Medical

## 2020-09-08 ENCOUNTER — Telehealth: Payer: Self-pay | Admitting: Medical

## 2020-09-08 ENCOUNTER — Other Ambulatory Visit: Payer: Self-pay

## 2020-09-08 ENCOUNTER — Encounter (HOSPITAL_COMMUNITY)
Admission: RE | Admit: 2020-09-08 | Discharge: 2020-09-08 | Disposition: A | Discharge status: Home / Self Care | Payer: Medicare Other | Source: Ambulatory Visit | Attending: Specialist | Admitting: Specialist

## 2020-09-08 ENCOUNTER — Telehealth: Payer: Self-pay

## 2020-09-08 ENCOUNTER — Encounter (HOSPITAL_COMMUNITY): Payer: Self-pay

## 2020-09-08 DIAGNOSIS — Z01812 Encounter for preprocedural laboratory examination: Secondary | ICD-10-CM | POA: Diagnosis not present

## 2020-09-08 LAB — BASIC METABOLIC PANEL
Anion gap: 7 (ref 5–15)
BUN: 11 mg/dL (ref 8–23)
CO2: 26 mmol/L (ref 22–32)
Calcium: 9.1 mg/dL (ref 8.9–10.3)
Chloride: 97 mmol/L — ABNORMAL LOW (ref 98–111)
Creatinine, Ser: 0.79 mg/dL (ref 0.61–1.24)
GFR, Estimated: >60 mL/min (ref >60)
Glucose, Bld: 167 mg/dL — ABNORMAL HIGH (ref 70–99)
Potassium: 4.4 mmol/L (ref 3.5–5.1)
Sodium: 130 mmol/L — ABNORMAL LOW (ref 135–145)

## 2020-09-08 LAB — HEMOGLOBIN A1C
Hgb A1c MFr Bld: 6.3 % — ABNORMAL HIGH (ref 4.8–5.6)
Mean Plasma Glucose: 134.11 mg/dL

## 2020-09-08 LAB — CBC
HCT: 38.3 % — ABNORMAL LOW (ref 39.0–52.0)
Hemoglobin: 12.7 g/dL — ABNORMAL LOW (ref 13.0–17.0)
MCH: 32.4 pg (ref 26.0–34.0)
MCHC: 33.2 g/dL (ref 30.0–36.0)
MCV: 97.7 fL (ref 80.0–100.0)
Platelets: 334 10*3/uL (ref 150–400)
RBC: 3.92 MIL/uL — ABNORMAL LOW (ref 4.22–5.81)
RDW: 12.9 % (ref 11.5–15.5)
WBC: 10.1 10*3/uL (ref 4.0–10.5)
nRBC: 0 % (ref 0.0–0.2)

## 2020-09-08 LAB — TYPE AND SCREEN
ABO/RH(D): A POS
Antibody Screen: NEGATIVE

## 2020-09-08 LAB — SURGICAL PCR SCREEN
MRSA, PCR: NEGATIVE
Staphylococcus aureus: NEGATIVE

## 2020-09-08 LAB — GLUCOSE, CAPILLARY: Glucose-Capillary: 178 mg/dL — ABNORMAL HIGH (ref 70–99)

## 2020-09-08 MED ORDER — ESZOPICLONE 3 MG PO TABS: 3.0000 mg | ORAL_TABLET | Freq: Every day | ORAL | 0 refills | Status: DC

## 2020-09-08 NOTE — Telephone Encounter (Signed)
Please advise 

## 2020-09-08 NOTE — Telephone Encounter (Signed)
Rx lunesta sent to pt pharmacy. Discontinue ambien. See pt my chart message.

## 2020-09-08 NOTE — Telephone Encounter (Signed)
Pre-admission called and wanted Dr. Louanne Skye to know that the pt's sodium was @ 130 today and they also wanted him to know they dont have his preop orders.   CB# for question is [phone]

## 2020-09-08 NOTE — Progress Notes (Signed)
PCP - Mackie Pai, PA-C Cardiologist - Shirlee More (for pre-op clearance)   PPM/ICD - denies  Chest x-ray - N/A EKG - 07/21/2020 Stress Test - denies ECHO - denies Cardiac Cath - denies  Sleep Study - Yes, patient stated it was negative CPAP - N/A  Fasting Blood Sugar - 110 - 120 Checks Blood Sugar 1 time/day  Blood Thinner Instructions: N/A Aspirin Instructions: Last dose today 09/08/20  ERAS Protcol - No orders  COVID TEST- Scheduled for 09/11/2020. Patient verbalized understanding of self-quarantine instructions, appointment time and place.  Anesthesia review: Yes, cardiac clearance.  Patient stated grew out of a heart murmur in his 70s  Patient denies shortness of breath, fever, cough and chest pain at PAT appointment  All instructions explained to the patient, with a verbal understanding of the material. Patient agrees to go over the instructions while at home for a better understanding. Patient also instructed to self quarantine after being tested for COVID-19. The opportunity to ask questions was provided.

## 2020-09-08 NOTE — Progress Notes (Signed)
Abnormal labs have resulted, Na 130. Info relayed to [place] (Dr. Otho Ket office).

## 2020-09-09 DIAGNOSIS — M150 Primary generalized (osteo)arthritis: Secondary | ICD-10-CM | POA: Diagnosis not present

## 2020-09-09 DIAGNOSIS — L814 Other melanin hyperpigmentation: Secondary | ICD-10-CM | POA: Diagnosis not present

## 2020-09-09 DIAGNOSIS — L819 Disorder of pigmentation, unspecified: Secondary | ICD-10-CM | POA: Diagnosis not present

## 2020-09-09 DIAGNOSIS — L570 Actinic keratosis: Secondary | ICD-10-CM | POA: Diagnosis not present

## 2020-09-09 DIAGNOSIS — M961 Postlaminectomy syndrome, not elsewhere classified: Secondary | ICD-10-CM | POA: Diagnosis not present

## 2020-09-09 DIAGNOSIS — Z79891 Long term (current) use of opiate analgesic: Secondary | ICD-10-CM | POA: Diagnosis not present

## 2020-09-09 DIAGNOSIS — G894 Chronic pain syndrome: Secondary | ICD-10-CM | POA: Diagnosis not present

## 2020-09-09 DIAGNOSIS — D229 Melanocytic nevi, unspecified: Secondary | ICD-10-CM | POA: Diagnosis not present

## 2020-09-09 DIAGNOSIS — L853 Xerosis cutis: Secondary | ICD-10-CM | POA: Diagnosis not present

## 2020-09-09 DIAGNOSIS — M4628 Osteomyelitis of vertebra, sacral and sacrococcygeal region: Secondary | ICD-10-CM | POA: Diagnosis not present

## 2020-09-09 DIAGNOSIS — L821 Other seborrheic keratosis: Secondary | ICD-10-CM | POA: Diagnosis not present

## 2020-09-09 NOTE — Anesthesia Preprocedure Evaluation (Deleted)
Anesthesia Evaluation    Airway        Dental   Pulmonary former smoker,           Cardiovascular hypertension,      Neuro/Psych    GI/Hepatic   Endo/Other  diabetes  Renal/GU      Musculoskeletal   Abdominal   Peds  Hematology   Anesthesia Other Findings   Reproductive/Obstetrics                             Anesthesia Physical Anesthesia Plan  ASA:   Anesthesia Plan:    Post-op Pain Management:    Induction:   PONV Risk Score and Plan:   Airway Management Planned:   Additional Equipment:   Intra-op Plan:   Post-operative Plan:   Informed Consent:   Plan Discussed with:   Anesthesia Plan Comments: (PAT note by Karoline Caldwell, PA-C: Patient was referred by PCP to cardiologist Dr. Shirlee More for preop evaluation.  Per note 07/21/2020, "Despite his back problems he is a vigorous man his exercise tolerance is greater than 5-7 METS no edema orthopnea chest pain palpitation or syncope he notes when he does vigorous garden work outdoors especially is not after extremities he will need to stop and rest more frequently than he used to but is not limited by shortness of breath. He has no known history of heart disease congenital or rheumatic. He has lost weight and his blood pressure is much better controlled He is asymptomatic with his peptic ulcer disease.Marland KitchenMarland KitchenHis revised cardiac risk score is quite low at 0, low risk."  Preop labs reviewed, sodium mildly low at 130, otherwise unremarkable.   DM2 well-controlled A1c 6.3.  EKG 07/21/2020: NSR with sinus arrhythmia.  Rate 75.)       Anesthesia Quick Evaluation

## 2020-09-09 NOTE — Progress Notes (Addendum)
Anesthesia Chart Review:  Patient was referred by PCP to cardiologist Dr. Shirlee More for preop evaluation.  Per note 07/21/2020, "Despite his back problems he is a vigorous man his exercise tolerance is greater than 5-7 METS no edema orthopnea chest pain palpitation or syncope he notes when he does vigorous garden work outdoors especially is not after extremities he will need to stop and rest more frequently than he used to but is not limited by shortness of breath. He has no known history of heart disease congenital or rheumatic. He has lost weight and his blood pressure is much better controlled He is asymptomatic with his peptic ulcer disease.Marland KitchenMarland KitchenHis revised cardiac risk score is quite low at 0, low risk."  Preop labs reviewed, sodium mildly low at 130, otherwise unremarkable.  DM2 well-controlled A1c 6.3.  EKG 07/21/2020: NSR with sinus arrhythmia.  Rate 75.   Wynonia Musty [facility] Short Stay Center/Anesthesiology Phone [phone] 09/09/2020 3:47 PM

## 2020-09-10 ENCOUNTER — Other Ambulatory Visit: Payer: Self-pay | Admitting: Specialist

## 2020-09-10 ENCOUNTER — Encounter: Payer: Self-pay | Admitting: Medical

## 2020-09-10 ENCOUNTER — Encounter: Payer: Self-pay | Admitting: Specialist

## 2020-09-11 ENCOUNTER — Other Ambulatory Visit (HOSPITAL_COMMUNITY)
Admission: RE | Admit: 2020-09-11 | Discharge: 2020-09-11 | Disposition: A | Discharge status: Home / Self Care | Payer: Medicare Other | Source: Ambulatory Visit | Attending: Specialist | Admitting: Specialist

## 2020-09-11 ENCOUNTER — Ambulatory Visit (INDEPENDENT_AMBULATORY_CARE_PROVIDER_SITE_OTHER): Payer: Medicare Other

## 2020-09-11 ENCOUNTER — Other Ambulatory Visit: Payer: Self-pay

## 2020-09-11 ENCOUNTER — Encounter: Payer: Self-pay | Admitting: Specialist

## 2020-09-11 DIAGNOSIS — T84038A Mechanical loosening of other internal prosthetic joint, initial encounter: Secondary | ICD-10-CM | POA: Diagnosis not present

## 2020-09-11 DIAGNOSIS — Z981 Arthrodesis status: Secondary | ICD-10-CM | POA: Diagnosis not present

## 2020-09-11 DIAGNOSIS — Z87891 Personal history of nicotine dependence: Secondary | ICD-10-CM | POA: Diagnosis not present

## 2020-09-11 DIAGNOSIS — Z86010 Personal history of colonic polyps: Secondary | ICD-10-CM | POA: Diagnosis not present

## 2020-09-11 DIAGNOSIS — E349 Endocrine disorder, unspecified: Secondary | ICD-10-CM

## 2020-09-11 DIAGNOSIS — K219 Gastro-esophageal reflux disease without esophagitis: Secondary | ICD-10-CM | POA: Diagnosis not present

## 2020-09-11 DIAGNOSIS — Z01812 Encounter for preprocedural laboratory examination: Secondary | ICD-10-CM | POA: Insufficient documentation

## 2020-09-11 DIAGNOSIS — Z8262 Family history of osteoporosis: Secondary | ICD-10-CM | POA: Diagnosis not present

## 2020-09-11 DIAGNOSIS — F32A Depression, unspecified: Secondary | ICD-10-CM | POA: Diagnosis not present

## 2020-09-11 DIAGNOSIS — Z825 Family history of asthma and other chronic lower respiratory diseases: Secondary | ICD-10-CM | POA: Diagnosis not present

## 2020-09-11 DIAGNOSIS — E785 Hyperlipidemia, unspecified: Secondary | ICD-10-CM | POA: Diagnosis not present

## 2020-09-11 DIAGNOSIS — Z20822 Contact with and (suspected) exposure to covid-19: Secondary | ICD-10-CM | POA: Insufficient documentation

## 2020-09-11 DIAGNOSIS — I10 Essential (primary) hypertension: Secondary | ICD-10-CM | POA: Diagnosis not present

## 2020-09-11 DIAGNOSIS — Z973 Presence of spectacles and contact lenses: Secondary | ICD-10-CM | POA: Diagnosis not present

## 2020-09-11 DIAGNOSIS — Z8249 Family history of ischemic heart disease and other diseases of the circulatory system: Secondary | ICD-10-CM | POA: Diagnosis not present

## 2020-09-11 DIAGNOSIS — R7303 Prediabetes: Secondary | ICD-10-CM | POA: Diagnosis not present

## 2020-09-11 DIAGNOSIS — Z823 Family history of stroke: Secondary | ICD-10-CM | POA: Diagnosis not present

## 2020-09-11 DIAGNOSIS — G629 Polyneuropathy, unspecified: Secondary | ICD-10-CM | POA: Diagnosis not present

## 2020-09-11 DIAGNOSIS — Z803 Family history of malignant neoplasm of breast: Secondary | ICD-10-CM | POA: Diagnosis not present

## 2020-09-11 DIAGNOSIS — Z833 Family history of diabetes mellitus: Secondary | ICD-10-CM | POA: Diagnosis not present

## 2020-09-11 DIAGNOSIS — R7989 Other specified abnormal findings of blood chemistry: Secondary | ICD-10-CM

## 2020-09-11 LAB — SARS CORONAVIRUS 2 (TAT 6-24 HRS): SARS Coronavirus 2: NEGATIVE

## 2020-09-11 NOTE — H&P (Signed)
Luis Brown is an 75 y.o. male.   Chief Complaint: Thoracic pain HPI: 75 year old white male is status post thoracic fusion comes in for preop evaluation.  He continues have ongoing pain due to loosened thoracic hardware.  He is wanting to proceed with REMOVAL OF RODS AND PEDICLE SCREWS T5 AND T6, EXPLORATION OF FUSION, BIOPSY TRANSPEDICULAR T5 AND T6 as scheduled.  Past Medical History:  Diagnosis Date  . Acute pharyngitis 10/21/2013  . Allergy    grass and pollen  . Anxiety and depression 10/25/2011  . BPH (benign prostatic hyperplasia) 04/23/2012  . Chicken pox as a child  . DDD (degenerative disc disease)    cervical responds to steroid injections and low back required surgery  . DDD (degenerative disc disease), lumbosacral   . Diabetes mellitus    pre  . Dyspnea   . ED (erectile dysfunction) 04/23/2012  . Elevated BP   . 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 Esophageal reflux 02/10/2015  . Fatigue   . HTN (hypertension)   . Hyperglycemia    preDM   . Hyperlipidemia   . Insomnia   . Low back pain radiating to both legs 01/16/2017  . Measles as a child  . Medicare annual wellness visit, subsequent 02/10/2015  . Overweight(278.02)   . Peripheral neuropathy 08/07/2019  . Personal history of colonic polyps 10/27/2012   Follows with [facility]  . Pneumonia    " walking"  . Preventative health care   . 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  . Wears glasses     Past Surgical History:  Procedure Laterality Date  . BACK SURGERY  2012 and 1994   Dr Margret Chance, screws and cage in low back  . BIOPSY  01/09/2020   Procedure: BIOPSY;  Surgeon: Otis Brace, MD;  Location: [facility];  Service: Gastroenterology;;  . COLONOSCOPY WITH PROPOFOL N/A 01/09/2020   Procedure: COLONOSCOPY WITH PROPOFOL;  Surgeon: Otis Brace, MD;  Location: [facility];  Service: Gastroenterology;  Laterality: N/A;  . ESOPHAGOGASTRODUODENOSCOPY (EGD) WITH PROPOFOL N/A 01/09/2020   Procedure:  ESOPHAGOGASTRODUODENOSCOPY (EGD) WITH PROPOFOL;  Surgeon: Otis Brace, MD;  Location: [facility];  Service: Gastroenterology;  Laterality: N/A;  . EYE SURGERY Bilateral    2016  . HARDWARE REVISION  03/12/2019   REVISION OF HARDWARE THORACIC TEN-THORACIC ELEVEN WITH APPLICATION OF ADDITIONAL RODS AND ROD SLEEVES THORACIC EIGHT-THORACIC TWELVE 12-LUMBAR ONE (N/A )  . LAMINECTOMY  02/12/2019   DECOMPRESSIVE LAMINECTOMY THORACIC NINE-THORACIC TEN AND THORACIC TEN-THORACIC ELEVEN, EXTENSION OF THORACOLUMBAR FUSION FROM THORACIC TEN TO THORACIC FIVE, LOCAL BONE GRAFT, ALLOGRAFT AND VIVIGEN (N/A)  . POLYPECTOMY  01/09/2020   Procedure: POLYPECTOMY;  Surgeon: Otis Brace, MD;  Location: [facility];  Service: Gastroenterology;;  . SPINAL FUSION N/A 02/12/2019   Procedure: DECOMPRESSIVE LAMINECTOMY THORACIC NINE-THORACIC TEN AND THORACIC TEN-THORACIC ELEVEN, EXTENSION OF THORACOLUMBAR FUSION FROM THORACIC TEN TO THORACIC FIVE, LOCAL BONE GRAFT, ALLOGRAFT AND VIVIGEN;  Surgeon: Jessy Oto, MD;  Location: [place];  Service: Orthopedics;  Laterality: N/A;  DECOMPRESSIVE LAMINECTOMY THORACIC NINE-THORACIC TEN AND THORACIC TEN-THORACIC ELEVEN, EXTENSION OF TH  . SPINAL FUSION N/A 03/12/2019   Procedure: REVISION OF HARDWARE THORACIC TEN-THORACIC ELEVEN WITH APPLICATION OF ADDITIONAL RODS AND ROD SLEEVES THORACIC EIGHT-THORACIC TWELVE 12-LUMBAR ONE;  Surgeon: Jessy Oto, MD;  Location: [place];  Service: Orthopedics;  Laterality: N/A;  . TONSILLECTOMY    . TONSILLECTOMY     as child  . torn rotator cuff  2010   right    Family History  Problem  Relation Age of Onset  . Hypertension Mother   . Diabetes Mother        type 2  . Cancer Mother 66       breast in remission  . Emphysema Father        smoker  . COPD Father        smoker  . Stroke Father 52       mini  . Heart disease Father   . Hypertension Sister   . Hyperlipidemia Sister   . Scoliosis Sister   . Osteoporosis Sister    . Hypertension Maternal Grandmother   . Scoliosis Maternal Grandmother   . Heart disease Maternal Grandfather   . Heart disease Paternal Grandfather        smoker  . Heart disease Daughter    Social History:  reports that he quit smoking about 31 years ago. His smoking use included cigarettes. He has a 20.00 pack-year smoking history. He has never used smokeless tobacco. He reports previous alcohol use. He reports that he does not use drugs.  Allergies: No Known Allergies  No medications prior to admission.    No results found. However, due to the size of the patient record, not all encounters were searched. Please check Results Review for a complete set of results. No results found.  Review of Systems  Constitutional: Positive for activity change.  HENT: Negative.   Respiratory: Negative.   Cardiovascular: Negative.   Gastrointestinal: Negative.   Genitourinary: Negative.   Musculoskeletal: Positive for back pain.  Neurological: Positive for numbness.  Psychiatric/Behavioral: Negative.     There were no vitals taken for this visit. Physical Exam HENT:     Head: Normocephalic and atraumatic.  Eyes:     Extraocular Movements: Extraocular movements intact.     Pupils: Pupils are equal, round, and reactive to light.  Cardiovascular:     Rate and Rhythm: Regular rhythm.  Pulmonary:     Effort: No respiratory distress.  Abdominal:     General: There is no distension.  Musculoskeletal:        General: Tenderness present.  Neurological:     Mental Status: He is oriented to person, place, and time.      Assessment/Plan Painful loosened thoracic hardware  We will proceed with surgery as scheduled.  Patient was seen with Dr. Louanne Skye in clinic today and he discussed surgical procedure in great detail.  All questions answered.  Benjiman Core, PA-C 09/11/2020, 3:16 PM

## 2020-09-11 NOTE — Telephone Encounter (Signed)
No, I did not.  Thanks!

## 2020-09-11 NOTE — Progress Notes (Addendum)
Patient here today for testosterone injection per Mackie Pai. 79mL given in right upper outer quadrant. Patient tolerated well. Next injection scheduled for approx 16 days from now.   Mackie Pai, PA-C

## 2020-09-13 ENCOUNTER — Other Ambulatory Visit: Payer: Self-pay | Admitting: Medical

## 2020-09-13 ENCOUNTER — Other Ambulatory Visit: Payer: Self-pay | Admitting: Specialist

## 2020-09-13 NOTE — Anesthesia Preprocedure Evaluation (Addendum)
Anesthesia Evaluation  Patient identified by MRN, date of birth, ID band Patient awake    Reviewed: Allergy & Precautions, NPO status , Patient's Chart, lab work & pertinent test results  History of Anesthesia Complications Negative for: history of anesthetic complications  Airway Mallampati: II  TM Distance: >3 FB Neck ROM: Full    Dental  (+) Dental Advisory Given   Pulmonary neg pulmonary ROS, former smoker,    Pulmonary exam normal        Cardiovascular hypertension, Pt. on medications Normal cardiovascular exam     Neuro/Psych PSYCHIATRIC DISORDERS Anxiety Depression  Hx epidural abscess     GI/Hepatic Neg liver ROS, GERD  Controlled and Medicated,  Endo/Other  diabetes, Type 2, Oral Hypoglycemic Agents Hyponatremia   Renal/GU negative Renal ROS    Testosterone deficiency     Musculoskeletal  (+) Arthritis ,   Abdominal   Peds  Hematology negative hematology ROS (+)   Anesthesia Other Findings Covid test negative   Reproductive/Obstetrics                            Anesthesia Physical  Anesthesia Plan  ASA: III  Anesthesia Plan: General   Post-op Pain Management:    Induction: Intravenous  PONV Risk Score and Plan: 3 and Treatment may vary due to age or medical condition, Ondansetron, Dexamethasone and Diphenhydramine  Airway Management Planned: Oral ETT  Additional Equipment: None  Intra-op Plan:   Post-operative Plan: Extubation in OR  Informed Consent: I have reviewed the patients History and Physical, chart, labs and discussed the procedure including the risks, benefits and alternatives for the proposed anesthesia with the patient or authorized representative who has indicated his/her understanding and acceptance.     Dental advisory given  Plan Discussed with: Anesthesiologist and CRNA  Anesthesia Plan Comments: (PAT note by Karoline Caldwell,  PA-C: Patient was referred by PCP to cardiologist Dr. Shirlee More for preop evaluation.  Per note 07/21/2020, "Despite his back problems he is a vigorous man his exercise tolerance is greater than 5-7 METS no edema orthopnea chest pain palpitation or syncope he notes when he does vigorous garden work outdoors especially is not after extremities he will need to stop and rest more frequently than he used to but is not limited by shortness of breath. He has no known history of heart disease congenital or rheumatic. He has lost weight and his blood pressure is much better controlled He is asymptomatic with his peptic ulcer disease.Marland KitchenMarland KitchenHis revised cardiac risk score is quite low at 0, low risk."  Preop labs reviewed, sodium mildly low at 130, otherwise unremarkable.   DM2 well-controlled A1c 6.3.  EKG 07/21/2020: NSR with sinus arrhythmia.  Rate 75. )     Anesthesia Quick Evaluation

## 2020-09-14 ENCOUNTER — Encounter (HOSPITAL_COMMUNITY)
Admission: RE | Disposition: A | Discharge status: Home / Self Care | Payer: Self-pay | Source: Home / Self Care | Attending: Specialist

## 2020-09-14 ENCOUNTER — Inpatient Hospital Stay (HOSPITAL_COMMUNITY): Payer: Medicare Other

## 2020-09-14 ENCOUNTER — Encounter (HOSPITAL_COMMUNITY): Payer: Self-pay | Admitting: Specialist

## 2020-09-14 ENCOUNTER — Other Ambulatory Visit: Payer: Self-pay

## 2020-09-14 ENCOUNTER — Inpatient Hospital Stay (HOSPITAL_COMMUNITY)
Admission: RE | Admit: 2020-09-14 | Discharge: 2020-09-15 | DRG: 479 | Disposition: A | Discharge status: Home Health | Payer: Medicare Other | Attending: Specialist | Admitting: Specialist

## 2020-09-14 ENCOUNTER — Inpatient Hospital Stay (HOSPITAL_COMMUNITY): Payer: Medicare Other | Admitting: Anesthesiology

## 2020-09-14 ENCOUNTER — Inpatient Hospital Stay (HOSPITAL_COMMUNITY): Payer: Medicare Other | Admitting: Physician Assistant

## 2020-09-14 DIAGNOSIS — K219 Gastro-esophageal reflux disease without esophagitis: Secondary | ICD-10-CM | POA: Diagnosis present

## 2020-09-14 DIAGNOSIS — Z20822 Contact with and (suspected) exposure to covid-19: Secondary | ICD-10-CM | POA: Diagnosis present

## 2020-09-14 DIAGNOSIS — E663 Overweight: Secondary | ICD-10-CM | POA: Diagnosis present

## 2020-09-14 DIAGNOSIS — I10 Essential (primary) hypertension: Secondary | ICD-10-CM | POA: Diagnosis present

## 2020-09-14 DIAGNOSIS — Z9889 Other specified postprocedural states: Secondary | ICD-10-CM

## 2020-09-14 DIAGNOSIS — Y838 Other surgical procedures as the cause of abnormal reaction of the patient, or of later complication, without mention of misadventure at the time of the procedure: Secondary | ICD-10-CM | POA: Diagnosis present

## 2020-09-14 DIAGNOSIS — Z8249 Family history of ischemic heart disease and other diseases of the circulatory system: Secondary | ICD-10-CM

## 2020-09-14 DIAGNOSIS — R7303 Prediabetes: Secondary | ICD-10-CM | POA: Diagnosis present

## 2020-09-14 DIAGNOSIS — F32A Depression, unspecified: Secondary | ICD-10-CM | POA: Diagnosis not present

## 2020-09-14 DIAGNOSIS — Z6825 Body mass index (BMI) 25.0-25.9, adult: Secondary | ICD-10-CM

## 2020-09-14 DIAGNOSIS — G629 Polyneuropathy, unspecified: Secondary | ICD-10-CM | POA: Diagnosis present

## 2020-09-14 DIAGNOSIS — Z825 Family history of asthma and other chronic lower respiratory diseases: Secondary | ICD-10-CM | POA: Diagnosis not present

## 2020-09-14 DIAGNOSIS — T84038A Mechanical loosening of other internal prosthetic joint, initial encounter: Secondary | ICD-10-CM | POA: Diagnosis not present

## 2020-09-14 DIAGNOSIS — Y798 Miscellaneous orthopedic devices associated with adverse incidents, not elsewhere classified: Secondary | ICD-10-CM | POA: Diagnosis present

## 2020-09-14 DIAGNOSIS — Z973 Presence of spectacles and contact lenses: Secondary | ICD-10-CM

## 2020-09-14 DIAGNOSIS — N400 Enlarged prostate without lower urinary tract symptoms: Secondary | ICD-10-CM | POA: Diagnosis present

## 2020-09-14 DIAGNOSIS — Z87891 Personal history of nicotine dependence: Secondary | ICD-10-CM | POA: Diagnosis not present

## 2020-09-14 DIAGNOSIS — Z833 Family history of diabetes mellitus: Secondary | ICD-10-CM

## 2020-09-14 DIAGNOSIS — Z803 Family history of malignant neoplasm of breast: Secondary | ICD-10-CM

## 2020-09-14 DIAGNOSIS — Z823 Family history of stroke: Secondary | ICD-10-CM

## 2020-09-14 DIAGNOSIS — F419 Anxiety disorder, unspecified: Secondary | ICD-10-CM | POA: Diagnosis present

## 2020-09-14 DIAGNOSIS — Y929 Unspecified place or not applicable: Secondary | ICD-10-CM

## 2020-09-14 DIAGNOSIS — Z419 Encounter for procedure for purposes other than remedying health state, unspecified: Secondary | ICD-10-CM

## 2020-09-14 DIAGNOSIS — Z86010 Personal history of colonic polyps: Secondary | ICD-10-CM

## 2020-09-14 DIAGNOSIS — Z8262 Family history of osteoporosis: Secondary | ICD-10-CM | POA: Diagnosis not present

## 2020-09-14 DIAGNOSIS — Z981 Arthrodesis status: Secondary | ICD-10-CM | POA: Diagnosis not present

## 2020-09-14 DIAGNOSIS — T84498A Other mechanical complication of other internal orthopedic devices, implants and grafts, initial encounter: Secondary | ICD-10-CM | POA: Diagnosis not present

## 2020-09-14 DIAGNOSIS — T8484XA Pain due to internal orthopedic prosthetic devices, implants and grafts, initial encounter: Secondary | ICD-10-CM

## 2020-09-14 DIAGNOSIS — E785 Hyperlipidemia, unspecified: Secondary | ICD-10-CM | POA: Diagnosis present

## 2020-09-14 DIAGNOSIS — M4324 Fusion of spine, thoracic region: Secondary | ICD-10-CM | POA: Diagnosis not present

## 2020-09-14 DIAGNOSIS — M546 Pain in thoracic spine: Secondary | ICD-10-CM | POA: Diagnosis present

## 2020-09-14 HISTORY — PX: OTHER SURGICAL HISTORY: SHX169

## 2020-09-14 LAB — COMPREHENSIVE METABOLIC PANEL
ALT: 9 U/L (ref 0–44)
AST: 16 U/L (ref 15–41)
Albumin: 3 g/dL — ABNORMAL LOW (ref 3.5–5.0)
Alkaline Phosphatase: 73 U/L (ref 38–126)
Anion gap: 10 (ref 5–15)
BUN: 12 mg/dL (ref 8–23)
CO2: 27 mmol/L (ref 22–32)
Calcium: 8.8 mg/dL — ABNORMAL LOW (ref 8.9–10.3)
Chloride: 96 mmol/L — ABNORMAL LOW (ref 98–111)
Creatinine, Ser: 0.9 mg/dL (ref 0.61–1.24)
GFR, Estimated: >60 mL/min (ref >60)
Glucose, Bld: 126 mg/dL — ABNORMAL HIGH (ref 70–99)
Potassium: 3.8 mmol/L (ref 3.5–5.1)
Sodium: 133 mmol/L — ABNORMAL LOW (ref 135–145)
Total Bilirubin: 0.5 mg/dL (ref 0.3–1.2)
Total Protein: 5.5 g/dL — ABNORMAL LOW (ref 6.5–8.1)

## 2020-09-14 LAB — CBC
HCT: 34.1 % — ABNORMAL LOW (ref 39.0–52.0)
Hemoglobin: 11.3 g/dL — ABNORMAL LOW (ref 13.0–17.0)
MCH: 32.2 pg (ref 26.0–34.0)
MCHC: 33.1 g/dL (ref 30.0–36.0)
MCV: 97.2 fL (ref 80.0–100.0)
Platelets: 334 10*3/uL (ref 150–400)
RBC: 3.51 MIL/uL — ABNORMAL LOW (ref 4.22–5.81)
RDW: 12.5 % (ref 11.5–15.5)
WBC: 7.7 10*3/uL (ref 4.0–10.5)
nRBC: 0 % (ref 0.0–0.2)

## 2020-09-14 LAB — PROTIME-INR
INR: 1.1 (ref 0.8–1.2)
Prothrombin Time: 13.6 seconds (ref 11.4–15.2)

## 2020-09-14 LAB — GLUCOSE, CAPILLARY
Glucose-Capillary: 126 mg/dL — ABNORMAL HIGH (ref 70–99)
Glucose-Capillary: 160 mg/dL — ABNORMAL HIGH (ref 70–99)
Glucose-Capillary: 156 mg/dL — ABNORMAL HIGH (ref 70–99)
Glucose-Capillary: 276 mg/dL — ABNORMAL HIGH (ref 70–99)
Glucose-Capillary: 288 mg/dL — ABNORMAL HIGH (ref 70–99)

## 2020-09-14 SURGERY — POSTERIOR LUMBAR FUSION 1 WITH HARDWARE REMOVAL
Anesthesia: General

## 2020-09-14 MED ORDER — GABAPENTIN 300 MG PO CAPS: 300.0000 mg | ORAL_CAPSULE | Freq: Three times a day (TID) | ORAL | Status: DC

## 2020-09-14 MED ORDER — ACETAMINOPHEN 500 MG PO TABS
1000.0000 mg | ORAL_TABLET | Freq: Once | ORAL | Status: DC
  Filled 2020-09-14: qty 2

## 2020-09-14 MED ORDER — CELECOXIB 200 MG PO CAPS
200.0000 mg | ORAL_CAPSULE | Freq: Once | ORAL | Status: AC
  Administered 2020-09-14: 200 mg via ORAL
  Filled 2020-09-14: qty 1

## 2020-09-14 MED ORDER — HYDROCODONE-ACETAMINOPHEN 7.5-325 MG PO TABS
2.0000 | ORAL_TABLET | ORAL | Status: DC | PRN
  Administered 2020-09-14: 2 via ORAL
  Filled 2020-09-14: qty 2

## 2020-09-14 MED ORDER — MECLIZINE HCL 12.5 MG PO TABS
12.5000 mg | ORAL_TABLET | Freq: Three times a day (TID) | ORAL | Status: DC | PRN
  Filled 2020-09-14: qty 1

## 2020-09-14 MED ORDER — ORAL CARE MOUTH RINSE: 15.0000 mL | Freq: Once | OROMUCOSAL | Status: AC

## 2020-09-14 MED ORDER — ACETAMINOPHEN 325 MG PO TABS: 650.0000 mg | ORAL_TABLET | ORAL | Status: DC | PRN

## 2020-09-14 MED ORDER — FENTANYL CITRATE (PF) 250 MCG/5ML IJ SOLN
INTRAMUSCULAR | Status: AC
  Filled 2020-09-14: qty 5

## 2020-09-14 MED ORDER — CEFAZOLIN SODIUM-DEXTROSE 2-4 GM/100ML-% IV SOLN
2.0000 g | Freq: Three times a day (TID) | INTRAVENOUS | Status: AC
  Administered 2020-09-14 (×2): 2 g via INTRAVENOUS
  Filled 2020-09-14 (×2): qty 100

## 2020-09-14 MED ORDER — BUPIVACAINE LIPOSOME 1.3 % IJ SUSP
INTRAMUSCULAR | Status: DC | PRN
  Administered 2020-09-14: 20 mL

## 2020-09-14 MED ORDER — SUCCINYLCHOLINE CHLORIDE 200 MG/10ML IV SOSY
PREFILLED_SYRINGE | INTRAVENOUS | Status: AC
  Filled 2020-09-14: qty 10

## 2020-09-14 MED ORDER — INSULIN ASPART 100 UNIT/ML ~~LOC~~ SOLN
0.0000 [IU] | Freq: Three times a day (TID) | SUBCUTANEOUS | Status: DC
  Administered 2020-09-14: 3 [IU] via SUBCUTANEOUS
  Administered 2020-09-14: 8 [IU] via SUBCUTANEOUS
  Administered 2020-09-15: 3 [IU] via SUBCUTANEOUS

## 2020-09-14 MED ORDER — BISACODYL 5 MG PO TBEC: 5.0000 mg | DELAYED_RELEASE_TABLET | Freq: Every day | ORAL | Status: DC | PRN

## 2020-09-14 MED ORDER — SUCRALFATE 1 G PO TABS
1.0000 g | ORAL_TABLET | Freq: Three times a day (TID) | ORAL | Status: DC
  Administered 2020-09-14 – 2020-09-15 (×4): 1 g via ORAL
  Filled 2020-09-14 (×7): qty 1

## 2020-09-14 MED ORDER — HYDROCODONE-ACETAMINOPHEN 7.5-325 MG PO TABS
1.0000 | ORAL_TABLET | Freq: Four times a day (QID) | ORAL | Status: DC
Start: 2020-09-14 — End: 2020-09-15
  Administered 2020-09-14 – 2020-09-15 (×3): 1 via ORAL
  Filled 2020-09-14 (×4): qty 1

## 2020-09-14 MED ORDER — ROCURONIUM BROMIDE 10 MG/ML (PF) SYRINGE
PREFILLED_SYRINGE | INTRAVENOUS | Status: AC
  Filled 2020-09-14: qty 10

## 2020-09-14 MED ORDER — FENTANYL CITRATE (PF) 100 MCG/2ML IJ SOLN
INTRAMUSCULAR | Status: AC
  Filled 2020-09-14: qty 2

## 2020-09-14 MED ORDER — CEFAZOLIN SODIUM-DEXTROSE 2-4 GM/100ML-% IV SOLN
2.0000 g | INTRAVENOUS | Status: AC
  Administered 2020-09-14: 2 g via INTRAVENOUS
  Filled 2020-09-14: qty 100

## 2020-09-14 MED ORDER — DEXAMETHASONE SODIUM PHOSPHATE 10 MG/ML IJ SOLN
INTRAMUSCULAR | Status: AC
  Filled 2020-09-14: qty 1

## 2020-09-14 MED ORDER — FENTANYL CITRATE (PF) 100 MCG/2ML IJ SOLN
25.0000 ug | INTRAMUSCULAR | Status: DC | PRN
  Administered 2020-09-14: 50 ug via INTRAVENOUS

## 2020-09-14 MED ORDER — SUFENTANIL CITRATE 50 MCG/ML IV SOLN
0.2500 ug/kg/h | Freq: Once | INTRAVENOUS | Status: DC
  Filled 2020-09-14: qty 1

## 2020-09-14 MED ORDER — B COMPLEX VITAMINS PO CAPS: 1.0000 | ORAL_CAPSULE | Freq: Every day | ORAL | Status: DC

## 2020-09-14 MED ORDER — DEXAMETHASONE SODIUM PHOSPHATE 10 MG/ML IJ SOLN
INTRAMUSCULAR | Status: DC | PRN
  Administered 2020-09-14: 5 mg via INTRAVENOUS

## 2020-09-14 MED ORDER — LORATADINE 10 MG PO TABS
10.0000 mg | ORAL_TABLET | Freq: Every day | ORAL | Status: DC
  Administered 2020-09-15: 10 mg via ORAL
  Filled 2020-09-14: qty 1

## 2020-09-14 MED ORDER — 0.9 % SODIUM CHLORIDE (POUR BTL) OPTIME
TOPICAL | Status: DC | PRN
  Administered 2020-09-14: 1000 mL

## 2020-09-14 MED ORDER — KETAMINE HCL 10 MG/ML IJ SOLN
INTRAMUSCULAR | Status: DC | PRN
Start: 2020-09-14 — End: 2020-09-14
  Administered 2020-09-14: 30 mg via INTRAVENOUS
  Administered 2020-09-14: 10 mg via INTRAVENOUS

## 2020-09-14 MED ORDER — B COMPLEX-C PO TABS
1.0000 | ORAL_TABLET | Freq: Every day | ORAL | Status: DC
  Administered 2020-09-15: 1 via ORAL
  Filled 2020-09-14 (×2): qty 1

## 2020-09-14 MED ORDER — SODIUM CHLORIDE 0.9% FLUSH: 3.0000 mL | INTRAVENOUS | Status: DC | PRN

## 2020-09-14 MED ORDER — EPHEDRINE SULFATE-NACL 50-0.9 MG/10ML-% IV SOSY
PREFILLED_SYRINGE | INTRAVENOUS | Status: DC | PRN
  Administered 2020-09-14 (×3): 10 mg via INTRAVENOUS

## 2020-09-14 MED ORDER — ONDANSETRON HCL 4 MG PO TABS: 4.0000 mg | ORAL_TABLET | Freq: Four times a day (QID) | ORAL | Status: DC | PRN

## 2020-09-14 MED ORDER — MENTHOL 3 MG MT LOZG: 1.0000 | LOZENGE | OROMUCOSAL | Status: DC | PRN

## 2020-09-14 MED ORDER — AMISULPRIDE (ANTIEMETIC) 5 MG/2ML IV SOLN: 10.0000 mg | Freq: Once | INTRAVENOUS | Status: DC | PRN

## 2020-09-14 MED ORDER — ALUM & MAG HYDROXIDE-SIMETH 200-200-20 MG/5ML PO SUSP
30.0000 mL | Freq: Four times a day (QID) | ORAL | Status: DC | PRN
  Administered 2020-09-15: 30 mL via ORAL
  Filled 2020-09-14: qty 30

## 2020-09-14 MED ORDER — VENLAFAXINE HCL ER 150 MG PO CP24
150.0000 mg | ORAL_CAPSULE | Freq: Every day | ORAL | Status: DC
  Administered 2020-09-15: 150 mg via ORAL
  Filled 2020-09-14: qty 1

## 2020-09-14 MED ORDER — PANTOPRAZOLE SODIUM 40 MG PO TBEC
40.0000 mg | DELAYED_RELEASE_TABLET | Freq: Two times a day (BID) | ORAL | Status: DC
  Administered 2020-09-14 – 2020-09-15 (×2): 40 mg via ORAL
  Filled 2020-09-14 (×2): qty 1

## 2020-09-14 MED ORDER — ONDANSETRON HCL 4 MG/2ML IJ SOLN
INTRAMUSCULAR | Status: DC | PRN
  Administered 2020-09-14: 4 mg via INTRAVENOUS

## 2020-09-14 MED ORDER — KETAMINE HCL 50 MG/5ML IJ SOSY
PREFILLED_SYRINGE | INTRAMUSCULAR | Status: AC
  Filled 2020-09-14: qty 10

## 2020-09-14 MED ORDER — PRESERVISION AREDS 2 PO CAPS: 1.0000 | ORAL_CAPSULE | Freq: Two times a day (BID) | ORAL | Status: DC

## 2020-09-14 MED ORDER — TIZANIDINE HCL 4 MG PO TABS: 6.0000 mg | ORAL_TABLET | Freq: Four times a day (QID) | ORAL | Status: DC | PRN

## 2020-09-14 MED ORDER — CHLORHEXIDINE GLUCONATE 0.12 % MT SOLN
15.0000 mL | Freq: Once | OROMUCOSAL | Status: AC
  Administered 2020-09-14: 15 mL via OROMUCOSAL
  Filled 2020-09-14: qty 15

## 2020-09-14 MED ORDER — BUPIVACAINE LIPOSOME 1.3 % IJ SUSP
20.0000 mL | Freq: Once | INTRAMUSCULAR | Status: DC
  Filled 2020-09-14: qty 20

## 2020-09-14 MED ORDER — PREGABALIN 75 MG PO CAPS
75.0000 mg | ORAL_CAPSULE | Freq: Two times a day (BID) | ORAL | Status: DC
Start: 2020-09-14 — End: 2020-09-15
  Administered 2020-09-14 – 2020-09-15 (×2): 75 mg via ORAL
  Filled 2020-09-14 (×2): qty 1

## 2020-09-14 MED ORDER — ROCURONIUM BROMIDE 10 MG/ML (PF) SYRINGE
PREFILLED_SYRINGE | INTRAVENOUS | Status: DC | PRN
  Administered 2020-09-14: 100 mg via INTRAVENOUS

## 2020-09-14 MED ORDER — INSULIN ASPART 100 UNIT/ML ~~LOC~~ SOLN
4.0000 [IU] | Freq: Three times a day (TID) | SUBCUTANEOUS | Status: DC
  Administered 2020-09-14 – 2020-09-15 (×3): 4 [IU] via SUBCUTANEOUS

## 2020-09-14 MED ORDER — PROPOFOL 10 MG/ML IV BOLUS
INTRAVENOUS | Status: DC | PRN
  Administered 2020-09-14: 160 mg via INTRAVENOUS

## 2020-09-14 MED ORDER — ATORVASTATIN CALCIUM 10 MG PO TABS
10.0000 mg | ORAL_TABLET | Freq: Every day | ORAL | Status: DC
  Administered 2020-09-15: 10 mg via ORAL
  Filled 2020-09-14: qty 1

## 2020-09-14 MED ORDER — SODIUM CHLORIDE 0.9% FLUSH: 3.0000 mL | Freq: Two times a day (BID) | INTRAVENOUS | Status: DC

## 2020-09-14 MED ORDER — FENTANYL CITRATE (PF) 100 MCG/2ML IJ SOLN
INTRAMUSCULAR | Status: DC | PRN
  Administered 2020-09-14: 50 ug via INTRAVENOUS
  Administered 2020-09-14: 100 ug via INTRAVENOUS

## 2020-09-14 MED ORDER — SODIUM CHLORIDE 0.9 % IV SOLN: INTRAVENOUS | Status: DC

## 2020-09-14 MED ORDER — BUPIVACAINE HCL (PF) 0.25 % IJ SOLN
INTRAMUSCULAR | Status: DC | PRN
  Administered 2020-09-14: 30 mL

## 2020-09-14 MED ORDER — LIDOCAINE 2% (20 MG/ML) 5 ML SYRINGE
INTRAMUSCULAR | Status: AC
  Filled 2020-09-14: qty 5

## 2020-09-14 MED ORDER — SUFENTANIL CITRATE 50 MCG/ML IV SOLN
INTRAVENOUS | Status: DC | PRN
  Administered 2020-09-14: .125 ug/kg/h via INTRAVENOUS

## 2020-09-14 MED ORDER — THROMBIN 20000 UNITS EX KIT
PACK | CUTANEOUS | Status: AC
  Filled 2020-09-14: qty 1

## 2020-09-14 MED ORDER — ASPIRIN EC 81 MG PO TBEC
81.0000 mg | DELAYED_RELEASE_TABLET | Freq: Every morning | ORAL | Status: DC
  Administered 2020-09-14 – 2020-09-15 (×2): 81 mg via ORAL
  Filled 2020-09-14 (×2): qty 1

## 2020-09-14 MED ORDER — TAMSULOSIN HCL 0.4 MG PO CAPS
0.8000 mg | ORAL_CAPSULE | Freq: Every day | ORAL | Status: DC
  Administered 2020-09-15: 0.8 mg via ORAL
  Filled 2020-09-14: qty 2

## 2020-09-14 MED ORDER — HYDROCODONE-ACETAMINOPHEN 7.5-325 MG PO TABS: 1.0000 | ORAL_TABLET | ORAL | Status: DC | PRN

## 2020-09-14 MED ORDER — ONDANSETRON HCL 4 MG/2ML IJ SOLN
INTRAMUSCULAR | Status: AC
  Filled 2020-09-14: qty 2

## 2020-09-14 MED ORDER — POLYETHYLENE GLYCOL 3350 17 G PO PACK: 17.0000 g | PACK | Freq: Every day | ORAL | Status: DC | PRN

## 2020-09-14 MED ORDER — LACTATED RINGERS IV SOLN: INTRAVENOUS | Status: DC

## 2020-09-14 MED ORDER — MIDAZOLAM HCL 2 MG/2ML IJ SOLN
INTRAMUSCULAR | Status: AC
  Filled 2020-09-14: qty 2

## 2020-09-14 MED ORDER — PROPOFOL 10 MG/ML IV BOLUS
INTRAVENOUS | Status: AC
  Filled 2020-09-14: qty 40

## 2020-09-14 MED ORDER — BUSPIRONE HCL 5 MG PO TABS
7.5000 mg | ORAL_TABLET | Freq: Two times a day (BID) | ORAL | Status: DC
  Administered 2020-09-14 – 2020-09-15 (×2): 7.5 mg via ORAL
  Filled 2020-09-14 (×2): qty 2

## 2020-09-14 MED ORDER — SODIUM CHLORIDE 0.9 % IV SOLN: 250.0000 mL | INTRAVENOUS | Status: DC

## 2020-09-14 MED ORDER — FERROUS FUMARATE 324 (106 FE) MG PO TABS
106.0000 mg | ORAL_TABLET | Freq: Two times a day (BID) | ORAL | Status: DC
  Administered 2020-09-14 – 2020-09-15 (×3): 106 mg via ORAL
  Filled 2020-09-14 (×4): qty 1

## 2020-09-14 MED ORDER — DOCUSATE SODIUM 100 MG PO CAPS
100.0000 mg | ORAL_CAPSULE | Freq: Two times a day (BID) | ORAL | Status: DC
  Administered 2020-09-14 – 2020-09-15 (×2): 100 mg via ORAL
  Filled 2020-09-14 (×2): qty 1

## 2020-09-14 MED ORDER — METHOCARBAMOL 500 MG PO TABS
500.0000 mg | ORAL_TABLET | Freq: Four times a day (QID) | ORAL | Status: DC | PRN
  Administered 2020-09-14 – 2020-09-15 (×2): 500 mg via ORAL
  Filled 2020-09-14 (×2): qty 1

## 2020-09-14 MED ORDER — ZOLPIDEM TARTRATE 5 MG PO TABS: 5.0000 mg | ORAL_TABLET | Freq: Every evening | ORAL | Status: DC | PRN

## 2020-09-14 MED ORDER — BUPIVACAINE HCL (PF) 0.25 % IJ SOLN
INTRAMUSCULAR | Status: AC
  Filled 2020-09-14: qty 30

## 2020-09-14 MED ORDER — ONDANSETRON HCL 4 MG/2ML IJ SOLN: 4.0000 mg | Freq: Four times a day (QID) | INTRAMUSCULAR | Status: DC | PRN

## 2020-09-14 MED ORDER — MORPHINE SULFATE (PF) 2 MG/ML IV SOLN
1.0000 mg | INTRAVENOUS | Status: DC | PRN
Start: 2020-09-14 — End: 2020-09-15
  Filled 2020-09-14: qty 1

## 2020-09-14 MED ORDER — ACETAMINOPHEN 650 MG RE SUPP: 650.0000 mg | RECTAL | Status: DC | PRN

## 2020-09-14 MED ORDER — PHENOL 1.4 % MT LIQD: 1.0000 | OROMUCOSAL | Status: DC | PRN

## 2020-09-14 MED ORDER — ACETAMINOPHEN 500 MG PO TABS
500.0000 mg | ORAL_TABLET | Freq: Once | ORAL | Status: AC
  Administered 2020-09-14: 500 mg via ORAL

## 2020-09-14 MED ORDER — LIDOCAINE 2% (20 MG/ML) 5 ML SYRINGE
INTRAMUSCULAR | Status: DC | PRN
  Administered 2020-09-14: 100 mg via INTRAVENOUS

## 2020-09-14 MED ORDER — METHOCARBAMOL 1000 MG/10ML IJ SOLN
500.0000 mg | Freq: Four times a day (QID) | INTRAVENOUS | Status: DC | PRN
  Filled 2020-09-14: qty 5

## 2020-09-14 MED ORDER — MIDAZOLAM HCL 5 MG/5ML IJ SOLN
INTRAMUSCULAR | Status: DC | PRN
  Administered 2020-09-14: 2 mg via INTRAVENOUS

## 2020-09-14 MED ORDER — PHENYLEPHRINE HCL-NACL 10-0.9 MG/250ML-% IV SOLN
INTRAVENOUS | Status: DC | PRN
Start: 2020-09-14 — End: 2020-09-14
  Administered 2020-09-14: 40 ug/min via INTRAVENOUS

## 2020-09-14 MED ORDER — ALBUMIN HUMAN 5 % IV SOLN
INTRAVENOUS | Status: DC | PRN
Start: 2020-09-14 — End: 2020-09-14

## 2020-09-14 MED ORDER — FLEET ENEMA 7-19 GM/118ML RE ENEM: 1.0000 | ENEMA | Freq: Once | RECTAL | Status: DC | PRN

## 2020-09-14 SURGICAL SUPPLY — 53 items
ADH SKN CLS APL DERMABOND .7 (GAUZE/BANDAGES/DRESSINGS) ×1
BLADE CLIPPER SURG (BLADE) IMPLANT
BUR MATCHSTICK NEURO 3.0 LAGG (BURR) ×2 IMPLANT
BUR SABER RD CUTTING 3.0 (BURR) IMPLANT
COVER BACK TABLE 80X110 HD (DRAPES) ×2 IMPLANT
COVER MAYO STAND STRL (DRAPES) ×4 IMPLANT
COVER SURGICAL LIGHT HANDLE (MISCELLANEOUS) ×2 IMPLANT
COVER WAND RF STERILE (DRAPES) ×2 IMPLANT
DERMABOND ADVANCED (GAUZE/BANDAGES/DRESSINGS) ×1
DERMABOND ADVANCED .7 DNX12 (GAUZE/BANDAGES/DRESSINGS) ×1 IMPLANT
DRAPE C-ARM 42X72 X-RAY (DRAPES) ×4 IMPLANT
DRAPE SURG 17X23 STRL (DRAPES) ×6 IMPLANT
DRSG MEPILEX BORDER 4X4 (GAUZE/BANDAGES/DRESSINGS) IMPLANT
DRSG MEPILEX BORDER 4X8 (GAUZE/BANDAGES/DRESSINGS) ×1 IMPLANT
DRSG PAD ABDOMINAL 8X10 ST (GAUZE/BANDAGES/DRESSINGS) ×2 IMPLANT
DURAPREP 26ML APPLICATOR (WOUND CARE) ×2 IMPLANT
ELECT BLADE 6.5 EXT (BLADE) IMPLANT
ELECT CAUTERY BLADE 6.4 (BLADE) ×2 IMPLANT
ELECT REM PT RETURN 9FT ADLT (ELECTROSURGICAL) ×2
ELECTRODE REM PT RTRN 9FT ADLT (ELECTROSURGICAL) ×1 IMPLANT
EVACUATOR 1/8 PVC DRAIN (DRAIN) IMPLANT
GAUZE SPONGE 4X4 12PLY STRL (GAUZE/BANDAGES/DRESSINGS) ×2 IMPLANT
GLOVE ECLIPSE 9.0 STRL (GLOVE) ×2 IMPLANT
GLOVE ORTHO TXT STRL SZ7.5 (GLOVE) ×2 IMPLANT
GLOVE SRG 8 PF TXTR STRL LF DI (GLOVE) ×1 IMPLANT
GLOVE SURG 8.5 LATEX PF (GLOVE) ×2 IMPLANT
GLOVE SURG UNDER POLY LF SZ8 (GLOVE) ×2
GOWN STRL REUS W/ TWL LRG LVL3 (GOWN DISPOSABLE) ×1 IMPLANT
GOWN STRL REUS W/TWL 2XL LVL3 (GOWN DISPOSABLE) ×4 IMPLANT
GOWN STRL REUS W/TWL LRG LVL3 (GOWN DISPOSABLE) ×2
KIT BASIN OR (CUSTOM PROCEDURE TRAY) ×2 IMPLANT
KIT POSITION SURG JACKSON T1 (MISCELLANEOUS) ×2 IMPLANT
KIT TURNOVER KIT B (KITS) ×2 IMPLANT
NEEDLE 22X1 1/2 (OR ONLY) (NEEDLE) ×2 IMPLANT
NS IRRIG 1000ML POUR BTL (IV SOLUTION) ×2 IMPLANT
PACK LAMINECTOMY ORTHO (CUSTOM PROCEDURE TRAY) ×2 IMPLANT
PAD ARMBOARD 7.5X6 YLW CONV (MISCELLANEOUS) ×4 IMPLANT
PATTIES SURGICAL .75X.75 (GAUZE/BANDAGES/DRESSINGS) ×2 IMPLANT
SPONGE LAP 4X18 RFD (DISPOSABLE) IMPLANT
SUT VIC AB 0 CT1 27 (SUTURE) ×2
SUT VIC AB 0 CT1 27XBRD ANBCTR (SUTURE) ×1 IMPLANT
SUT VIC AB 1 CTX 36 (SUTURE) ×4
SUT VIC AB 1 CTX36XBRD ANBCTR (SUTURE) ×2 IMPLANT
SUT VIC AB 2-0 CT1 27 (SUTURE) ×2
SUT VIC AB 2-0 CT1 TAPERPNT 27 (SUTURE) ×1 IMPLANT
SUT VIC AB 3-0 X1 27 (SUTURE) ×2 IMPLANT
SYR 20ML LL LF (SYRINGE) ×2 IMPLANT
SYR CONTROL 10ML LL (SYRINGE) ×4 IMPLANT
TOWEL GREEN STERILE (TOWEL DISPOSABLE) ×2 IMPLANT
TOWEL GREEN STERILE FF (TOWEL DISPOSABLE) ×2 IMPLANT
TRAY FOLEY MTR SLVR 16FR STAT (SET/KITS/TRAYS/PACK) ×2 IMPLANT
WATER STERILE IRR 1000ML POUR (IV SOLUTION) ×2 IMPLANT
YANKAUER SUCT BULB TIP NO VENT (SUCTIONS) ×2 IMPLANT

## 2020-09-14 NOTE — Brief Op Note (Signed)
09/14/2020  9:48 AM  PATIENT:  Luis Brown  75 y.o. male  PRE-OPERATIVE DIAGNOSIS:  loosening of hardware T5, T6 pedicle screws and rods  POST-OPERATIVE DIAGNOSIS:  loosening of hardware T5, T6 pedicle screws and rods  PROCEDURE:  Procedure(s): REMOVAL OF RODS AND PEDICLE SCREWS T5 AND T6, EXPLORATION OF FUSION, BIOPSY TRANSPEDICULAR T5 AND T6 (N/A)  SURGEON:  Surgeon(s) and Role:    * Jessy Oto, MD - Primary  PHYSICIAN ASSISTANT: Benjiman Core, [facility]  ANESTHESIA:   local and general  EBL:  150 mL   BLOOD ADMINISTERED:none  DRAINS: none   LOCAL MEDICATIONS USED:  MARCAINE 0.5% 1:1 EXPAREL1.3% Amount: 20 ml  SPECIMEN:  No Specimen  DISPOSITION OF SPECIMEN:  PATHOLOGY  COUNTS:  YES  TOURNIQUET:  * No tourniquets in log *  DICTATION: .Note written in paper chart  PLAN OF CARE: Admit for overnight observation  PATIENT DISPOSITION:  PACU - hemodynamically stable.   Delay start of Pharmacological VTE agent (>24hrs) due to surgical blood loss or risk of bleeding: yes

## 2020-09-14 NOTE — Anesthesia Procedure Notes (Signed)
Procedure Name: Intubation Date/Time: 09/14/2020 7:47 AM Performed by: Hoy Morn, CRNA Pre-anesthesia Checklist: Patient identified, Emergency Drugs available, Suction available and Patient being monitored Patient Re-evaluated:Patient Re-evaluated prior to induction Oxygen Delivery Method: Circle system utilized Preoxygenation: Pre-oxygenation with 100% oxygen Induction Type: IV induction Ventilation: Mask ventilation without difficulty Laryngoscope Size: Miller and 2 Grade View: Grade I Tube type: Oral Tube size: 7.5 mm Number of attempts: 1 Airway Equipment and Method: Stylet and Oral airway Placement Confirmation: ETT inserted through vocal cords under direct vision,  positive ETCO2 and breath sounds checked- equal and bilateral Secured at: 22 cm Tube secured with: Tape Dental Injury: Teeth and Oropharynx as per pre-operative assessment

## 2020-09-14 NOTE — Transfer of Care (Signed)
Immediate Anesthesia Transfer of Care Note  Patient: Luis Brown  Procedure(s) Performed: REMOVAL OF RODS AND PEDICLE SCREWS T5 AND T6, EXPLORATION OF FUSION, BIOPSY TRANSPEDICULAR T5 AND T6 (N/A )  Patient Location: PACU  Anesthesia Type:General  Level of Consciousness: awake  Airway & Oxygen Therapy: Patient Spontanous Breathing and Patient connected to face mask oxygen  Post-op Assessment: Report given to RN and Post -op Vital signs reviewed and stable  Post vital signs: Reviewed and stable  Last Vitals:  Vitals Value Taken Time  BP 153/63 09/14/20 1002  Temp    Pulse 82 09/14/20 1007  Resp 13 09/14/20 1007  SpO2 95 % 09/14/20 1007  Vitals shown include unvalidated device data.  Last Pain:  Vitals:   09/14/20 0640  TempSrc:   PainSc: 8          Complications: No complications documented.

## 2020-09-14 NOTE — Interval H&P Note (Signed)
History and Physical Interval Note:  09/14/2020 7:44 AM  Luis Brown  has presented today for surgery, with the diagnosis of loosening of hardware T5, T6 pedicle screws and rods.  The various methods of treatment have been discussed with the patient and family. After consideration of risks, benefits and other options for treatment, the patient has consented to  Procedure(s): REMOVAL OF RODS AND PEDICLE SCREWS T5 AND T6, EXPLORATION OF FUSION, BIOPSY TRANSPEDICULAR T5 AND T6 (N/A) as a surgical intervention.  The patient's history has been reviewed, patient examined, no change in status, stable for surgery.  I have reviewed the patient's chart and labs.  Questions were answered to the patient's satisfaction.     [name]

## 2020-09-14 NOTE — Op Note (Signed)
09/14/2020  9:56 AM  PATIENT:  Luis Brown  75 y.o. male  MRN: [id number]  OPERATIVE REPORT  PRE-OPERATIVE DIAGNOSIS:  loosening of hardware T5, T6 pedicle screws and rods  POST-OPERATIVE DIAGNOSIS:  loosening of hardware T5, T6 pedicle screws and rods  PROCEDURE:  Procedure(s): REMOVAL OF RODS AND PEDICLE SCREWS T5 AND T6, EXPLORATION OF FUSION, BIOPSY TRANSPEDICULAR T5 AND T6    SURGEON:  Kerrin Champagne, MD     ASSISTANT: Zonia Kief, PA-C  (Present throughout the entire procedure and necessary for completion of procedure in a timely manner)     ANESTHESIA:  General,    COMPLICATIONS:  None.  EBL: 150cc     COMPONENTS REMOVED: 4 pedicle screws, fenestrated and superior section of rods T7 to T5. 4 pedicle fastener caps.   PROCEDURE: The patient was met in the holding area, and the appropriate right thoracic spine and left iliac crest  identified and marked with an "X" and my initials.   The patient was then transported to OR. The patient was then placed under  general anesthesia without difficulty. The patient received appropriate preoperative antibiotic prophylaxis ancef 2 grams. Patient was then transferred to the [facility] spine table and frame, prone. All pressure points were well padded, arms at the side 90-90 at shoulder and elbow. PAS applied to both legs. Standard prep with duraprep from the Upper thoracic spine T2 to the T8  Level. Draped in the usual manner, iodine vi-drape used. Time-out procedure was called and correct. The old incision scar was marked out and infiltrated with Marcaine 1:1 with excellent exparel total of 20 cc used. Initial incision made at the T4 level and carried down to the T8 level in the midline. Through skin and subcutaneous layers using knife 10 blade scalpel. Electrocautery then used to divide the deeper layers to the spinous processes from T5-T11. Cobb was then used to elevate her dorsal muscles exposing the previous rods and screws at the T5  and T6 levels. Further exposure carried caudally to the T7 level elevating her dorsal muscles out to the transverse process articulation with ribs. Cerebellar retractors were inserted. Soft tissue carefully debrided off the posterior aspect of the patient's posterior elements from T5-T7. The fastener caps then removed bilateral T5 and T6 pedicle screws posteriorly. The rods exposed bilaterally particularly T6 and T7 pedicle fasteners. The interspinous ligament removed at T6-7 and the rods divided between T6 and T7 pedicle fasteners using a side cutting rod cutter. The right rod was lifted with a rod gripper to deliver it into the cutter and the rod divided. Each of the 4 pedicle screws at the T5 and T6 level were loose and these were removed with a tonsil clamp. The incision irrigated with copious normal saline solution. The C-arm fluoro used to examine the T5 T6 pedicle screw openings and these were explored with a micropituitary. Soft tissue was obtained from each of the pedicle openings and from the pseudocapsule about the screw threads. The incision then irrigated with Normal saline. Patient's thoracolumbar fascia was then reapproximated to the spinous processes in the midline with interrupted #1 Vicryl sutures and the deep subcutaneous layers approximated with interrupted 0 Vicryl sutures more superficial layers with interrupted 2-0 Vicryl sutures skin closed with stainless steel staples.  Dressings were then applied to the thoracic incision using Mepilex dressing as well as the left iliac crest Mepilex dressings. Patient was then returned to a supine position on his hospital bed, he was then reactivated extubated  and returned to the recovery room in satisfactory condition all instrument and sponge counts were correct.  SPECIMEN: Pseudocapsule from pedicle screws and soft tissue from the pedicle screw openings was sent for culture and sensitivities.  Benjiman Core PA-C perform the duties of assistant surgeon  during this case. He was present from the beginning of the case to the end of the case assisting in transfer the patient from his stretcher to the OR table and back to the stretcher at the end of the case. Assisted in careful retraction and suction of the laminectomy site delicate neural structures operating under loope. He performed closure of the incision from the fascia to the skin applying the dressing.      [name]  09/14/2020, 9:56 AM

## 2020-09-14 NOTE — Anesthesia Postprocedure Evaluation (Signed)
Anesthesia Post Note  Patient: Luis Brown  Procedure(s) Performed: REMOVAL OF RODS AND PEDICLE SCREWS T5 AND T6, EXPLORATION OF FUSION, BIOPSY TRANSPEDICULAR T5 AND T6 (N/A )     Patient location during evaluation: PACU Anesthesia Type: General Level of consciousness: sedated Pain management: pain level controlled Vital Signs Assessment: post-procedure vital signs reviewed and stable Respiratory status: spontaneous breathing and respiratory function stable Cardiovascular status: stable Postop Assessment: no apparent nausea or vomiting Anesthetic complications: no   No complications documented.  Last Vitals:  Vitals:   09/14/20 1032 09/14/20 1047  BP: (!) 164/79 (!) 161/80  Pulse: 96 76  Resp: 18 13  Temp:    SpO2: 98% 98%    Last Pain:  Vitals:   09/14/20 1047  TempSrc:   PainSc: 4                  [name] DANIEL

## 2020-09-14 NOTE — Discharge Instructions (Signed)
    No lifting greater than 10 lbs. Avoid bending, stooping and twisting. Walk in house for first week them may start to get out slowly increasing distance up to one mile by 3 weeks post op. Keep incision dry for 3 days, may use tegaderm or similar water impervious dressing.  

## 2020-09-15 ENCOUNTER — Encounter: Payer: Self-pay | Admitting: Specialist

## 2020-09-15 LAB — CBC
HCT: 32.6 % — ABNORMAL LOW (ref 39.0–52.0)
Hemoglobin: 11 g/dL — ABNORMAL LOW (ref 13.0–17.0)
MCH: 31.8 pg (ref 26.0–34.0)
MCHC: 33.7 g/dL (ref 30.0–36.0)
MCV: 94.2 fL (ref 80.0–100.0)
Platelets: 370 10*3/uL (ref 150–400)
RBC: 3.46 MIL/uL — ABNORMAL LOW (ref 4.22–5.81)
RDW: 12.5 % (ref 11.5–15.5)
WBC: 13.4 10*3/uL — ABNORMAL HIGH (ref 4.0–10.5)
nRBC: 0 % (ref 0.0–0.2)

## 2020-09-15 LAB — BASIC METABOLIC PANEL
Anion gap: 7 (ref 5–15)
BUN: 10 mg/dL (ref 8–23)
CO2: 27 mmol/L (ref 22–32)
Calcium: 8.5 mg/dL — ABNORMAL LOW (ref 8.9–10.3)
Chloride: 100 mmol/L (ref 98–111)
Creatinine, Ser: 0.68 mg/dL (ref 0.61–1.24)
GFR, Estimated: >60 mL/min (ref >60)
Glucose, Bld: 199 mg/dL — ABNORMAL HIGH (ref 70–99)
Potassium: 4 mmol/L (ref 3.5–5.1)
Sodium: 134 mmol/L — ABNORMAL LOW (ref 135–145)

## 2020-09-15 LAB — GLUCOSE, CAPILLARY
Glucose-Capillary: 172 mg/dL — ABNORMAL HIGH (ref 70–99)
Glucose-Capillary: 78 mg/dL (ref 70–99)

## 2020-09-15 MED ORDER — HYDROCODONE-ACETAMINOPHEN 7.5-325 MG PO TABS: 1.0000 | ORAL_TABLET | ORAL | 0 refills | Status: DC | PRN

## 2020-09-15 MED ORDER — METHOCARBAMOL 500 MG PO TABS: 500.0000 mg | ORAL_TABLET | Freq: Three times a day (TID) | ORAL | 1 refills | Status: DC | PRN

## 2020-09-15 MED ORDER — DOCUSATE SODIUM 100 MG PO CAPS: 100.0000 mg | ORAL_CAPSULE | Freq: Two times a day (BID) | ORAL | 0 refills | Status: DC

## 2020-09-15 NOTE — Evaluation (Signed)
Occupational Therapy Evaluation Patient Details Name: Luis Brown MRN: [id number] DOB: [birth year] Today's Date: 09/15/2020    History of Present Illness REMOVAL OF RODS AND PEDICLE SCREWS T5 AND T6, EXPLORATION OF FUSION, BIOPSY TRANSPEDICULAR T5 AND T6 (N/A) as a surgical intervention   Clinical Impression   Pt is at min A level with LB selfcare, Sup- Mod I with ADL mobility using no AD. Pt will have 24/7 assist at home from his wife. Pt with hx of back surgeries and has all necessary DME and A/E at home. All education is completed and no further acute OT services are indicated at this time. OT will sign off    Follow Up Recommendations  No OT follow up    Equipment Recommendations  None recommended by OT    Recommendations for Other Services       Precautions / Restrictions Precautions Precautions: Fall;Back Precaution Booklet Issued: No Precaution Comments: reviewed back precautions with pt, pt has hx of back surgeries Restrictions Weight Bearing Restrictions: No      Mobility Bed Mobility Overal bed mobility: Needs Assistance                  Transfers Overall transfer level: Needs assistance Equipment used: None Transfers: Sit to/from Stand Sit to Stand: Supervision              Balance Overall balance assessment: Mild deficits observed, not formally tested                                         ADL either performed or assessed with clinical judgement   ADL                                         General ADL Comments: min A with LB bathing and dressing     Vision Patient Visual Report: No change from baseline       Perception     Praxis      Pertinent Vitals/Pain Pain Assessment: Faces Faces Pain Scale: Hurts a little bit Pain Location: during bed mobility, sit - stand Pain Descriptors / Indicators: Discomfort Pain Intervention(s): Monitored during session;Repositioned     Hand Dominance  Right   Extremity/Trunk Assessment Upper Extremity Assessment Upper Extremity Assessment: Overall WFL for tasks assessed   Lower Extremity Assessment Lower Extremity Assessment: Defer to PT evaluation       Communication Communication Communication: No difficulties   Cognition Arousal/Alertness: Awake/alert Behavior During Therapy: WFL for tasks assessed/performed Overall Cognitive Status: Within Functional Limits for tasks assessed                                     General Comments       Exercises     Shoulder Instructions      Home Living Family/patient expects to be discharged to:: Private residence Living Arrangements: Spouse/significant other Available Help at Discharge: Family;Available 24 hours/day Type of Home: House Home Access: Stairs to enter [employer] of Steps: 2   Home Layout: One level     Bathroom Shower/Tub: Occupational psychologist: Handicapped height     Home Equipment: Environmental consultant - 2 wheels;Cane - single point;Bedside commode  Prior Functioning/Environment Level of Independence: Independent                 OT Problem List: Decreased activity tolerance;Impaired balance (sitting and/or standing)      OT Treatment/Interventions:      OT Goals(Current goals can be found in the care plan section) Acute Rehab OT Goals Patient Stated Goal: go home OT Goal Formulation: With patient/family  OT Frequency:     Barriers to D/C:            Co-evaluation              AM-PAC OT "6 Clicks" Daily Activity     Outcome Measure Help from another person eating meals?: None Help from another person taking care of personal grooming?: None Help from another person toileting, which includes using toliet, bedpan, or urinal?: None Help from another person bathing (including washing, rinsing, drying)?: A Little Help from another person to put on and taking off regular upper body clothing?: None Help  from another person to put on and taking off regular lower body clothing?: A Little 6 Click Score: 22   End of Session    Activity Tolerance: Patient tolerated treatment well Patient left: in bed;with call bell/phone within reach;with family/visitor present  OT Visit Diagnosis: Unsteadiness on feet (R26.81);Pain Pain - part of body:  (back surgical area)                Time: 5997-7414 OT Time Calculation (min): 20 min Charges:  OT General Charges $OT Visit: 1 Visit OT Treatments $Self Care/Home Management : 8-22 mins    Britt Bottom 09/15/2020, 2:01 PM

## 2020-09-15 NOTE — Plan of Care (Signed)
  Problem: Education: Goal: Knowledge of General Education information will improve Description Including pain rating scale, medication(s)/side effects and non-pharmacologic comfort measures Outcome: Progressing   

## 2020-09-15 NOTE — Progress Notes (Signed)
Discharge: Pt d/c from room via wheelchair, Family member with the pt. Discharge instructions given to the patient and family members.  No questions from pt, reintegrated to the pt to call or go to the ED for chest discomfort. Pt dressed in street clothes and left with discharge papers and prescriptions in hand. IV d/ced, tele removed and no complaints of pain or discomfort. 

## 2020-09-15 NOTE — Progress Notes (Addendum)
     Subjective: 1 Day Post-Op Procedure(s) (LRB): REMOVAL OF RODS AND PEDICLE SCREWS T5 AND T6, EXPLORATION OF FUSION, BIOPSY TRANSPEDICULAR T5 AND T6 (N/A)Awake, alert and oriented x 4. Bloody drainage from incision, less than when dressing changed this AM. Voiding well post foley dc. Legs Nv normal up to bathroom prn. Tolerating po narcotics and nourishment.   Patient reports pain as moderate.    Objective:   VITALS:  Temp:  [97.3 F (36.3 C)-98.6 F (37 C)] 97.6 F (36.4 C) (03/01 0851) Pulse Rate:  [65-96] 82 (03/01 0851) Resp:  [14-17] 17 (03/01 0851) BP: (97-176)/(46-82) 176/72 (03/01 0851) SpO2:  [97 %-100 %] 100 % (03/01 0851)  Neurologically intact ABD soft Neurovascular intact Sensation intact distally Intact pulses distally Dorsiflexion/Plantar flexion intact Incision: moderate drainage and Changed to 4x4 and ABD with hypofix tape to containe drainage. Keep dry for 3-4 days or until dry.    LABS Recent Labs    09/14/20 0614 09/15/20 0158  HGB 11.3* 11.0*  WBC 7.7 13.4*  PLT 334 370   Recent Labs    09/14/20 0614 09/15/20 0158  NA 133* 134*  K 3.8 4.0  CL 96* 100  CO2 27 27  BUN 12 10  CREATININE 0.90 0.68  GLUCOSE 126* 199*   Recent Labs    09/14/20 0614  INR 1.1  Microbiology gram stain NOS, POD#1 no growth will need continue to follow while outpatient.    Assessment/Plan: 1 Day Post-Op Procedure(s) (LRB): REMOVAL OF RODS AND PEDICLE SCREWS T5 AND T6, EXPLORATION OF FUSION, BIOPSY TRANSPEDICULAR T5 AND T6 (N/A)  Advance diet Up with therapy D/C IV fluids Discharge home with home health  [name] 09/15/2020, 11:11 AMPatient ID: Luis Brown, male   DOB: 02-03-1946, 75 y.o.   MRN: [id number]

## 2020-09-15 NOTE — TOC Transition Note (Signed)
Transition of Care [facility]) - CM/SW Discharge Note   Patient Details  Name: Luis Brown MRN: [id number] Date of Birth: 07/07/1946  [facility] CM/SW Contact:  Sharin Mons, RN Phone Number: 09/15/2020, 11:44 AM   Clinical Narrative:    Patient will DC to: home Anticipated DC date: 09/15/2020 Family notified: yes,, wife Transport by: car  Presented with  loosening of hardware T5, T6 pedicle screws and rods.      - s/p REMOVAL OF RODS AND PEDICLE SCREWS T5 AND T6, EXPLORATION OF FUSION, BIOPSY TRANSPEDICULAR T5 AND T6, 2/28  Per MD patient ready for DC today . RN, patient, and patient's familynotified of DC. Pt states he doesn't need [place] services and had no DME needs. States has young wife that  works from home. States will assist and care for his needs.  Pt without Rx med concerns.  Post hospital f/u noted on AVS.   RNCM will sign off for now as intervention is no longer needed. Please consult [place] again if new needs arise.     Final next level of care: [place] Barriers to Discharge: No Barriers Identified   Patient Goals and CMS Choice        Discharge Placement                       Discharge Plan and Services                                     Social Determinants of Health (SDOH) Interventions     Readmission Risk Interventions No flowsheet data found.

## 2020-09-19 ENCOUNTER — Encounter: Payer: Self-pay | Admitting: Specialist

## 2020-09-19 LAB — AEROBIC/ANAEROBIC CULTURE W GRAM STAIN (SURGICAL/DEEP WOUND)

## 2020-09-24 ENCOUNTER — Other Ambulatory Visit: Payer: Self-pay | Admitting: Specialist

## 2020-09-24 MED ORDER — SULFAMETHOXAZOLE-TRIMETHOPRIM 800-160 MG PO TABS: 1.0000 | ORAL_TABLET | Freq: Two times a day (BID) | ORAL | 0 refills | Status: DC

## 2020-09-25 ENCOUNTER — Telehealth: Payer: Self-pay | Admitting: Infectious Disease

## 2020-09-25 ENCOUNTER — Telehealth: Payer: Self-pay

## 2020-09-25 NOTE — Telephone Encounter (Signed)
-----   Message from Rosiland Oz, MD sent at 09/24/2020  7:20 PM EST ----- Regarding: Needs appointment This patient ( Dr Lucianne Lei Dams patient )  referred by Ortho for possible need for IV antibiotics. Could you please schedule an appt with any of the providers who has the earliest availability ? Thanks

## 2020-09-25 NOTE — Telephone Encounter (Signed)
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 Collene Mares I believe placed on bactrim  He needs to be seen in clinic next week and may need repeat course of IV antibiotics.   Can someone place him with one of my partners?  I am happy to review case w them

## 2020-09-25 NOTE — Discharge Summary (Signed)
[name] MRN: [id number] DOB/AGE: 1945/09/05 75 y.o.  Admit date: 09/14/2020 Discharge date: 09/25/2020  Admission Diagnoses:  Active Problems:   History of removal of retained hardware   Discharge Diagnoses:  Active Problems:   History of removal of retained hardware  status post Procedure(s): REMOVAL OF RODS AND PEDICLE SCREWS T5 AND T6, EXPLORATION OF FUSION, BIOPSY TRANSPEDICULAR T5 AND T6  Past Medical History:  Diagnosis Date  . Acute pharyngitis 10/21/2013  . Allergy    grass and pollen  . Anxiety and depression 10/25/2011  . BPH (benign prostatic hyperplasia) 04/23/2012  . Chicken pox as a child  . DDD (degenerative disc disease)    cervical responds to steroid injections and low back required surgery  . DDD (degenerative disc disease), lumbosacral   . Diabetes mellitus    pre  . Dyspnea   . ED (erectile dysfunction) 04/23/2012  . Elevated BP   . 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 Esophageal reflux 02/10/2015  . Fatigue   . HTN (hypertension)   . Hyperglycemia    preDM   . Hyperlipidemia   . Insomnia   . Low back pain radiating to both legs 01/16/2017  . Measles as a child  . Medicare annual wellness visit, subsequent 02/10/2015  . Overweight(278.02)   . Peripheral neuropathy 08/07/2019  . Personal history of colonic polyps 10/27/2012   Follows with [facility]  . Pneumonia    " walking"  . Preventative health care   . 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  . Wears glasses     Surgeries: Procedure(s): REMOVAL OF RODS AND PEDICLE SCREWS T5 AND T6, EXPLORATION OF FUSION, BIOPSY TRANSPEDICULAR T5 AND T6 on 09/14/2020   Consultants:   Discharged Condition: Improved  Hospital Course: Luis Brown is an 75 y.o. male who was admitted 09/14/2020 for operative treatment of failed hardware thoracic spine. Patient failed conservative treatments (please see the history and physical for the specifics) and had severe unremitting pain that affects  sleep, daily activities and work/hobbies. After pre-op clearance, the patient was taken to the operating room on 09/14/2020 and underwent  Procedure(s): REMOVAL OF RODS AND PEDICLE SCREWS T5 AND T6, EXPLORATION OF FUSION, BIOPSY TRANSPEDICULAR T5 AND T6.    Patient was given perioperative antibiotics:  Anti-infectives (From admission, onward)   Start     Dose/Rate Route Frequency Ordered Stop   09/14/20 1600  ceFAZolin (ANCEF) IVPB 2g/100 mL premix        2 g 200 mL/hr over 30 Minutes Intravenous Every 8 hours 09/14/20 1114 09/15/20 0708   09/14/20 0615  ceFAZolin (ANCEF) IVPB 2g/100 mL premix        2 g 200 mL/hr over 30 Minutes Intravenous On call to O.R. 09/14/20 [id number] 09/14/20 0800       Patient was given sequential compression devices and early ambulation to prevent DVT.   Patient benefited maximally from hospital stay and there were no complications. At the time of discharge, the patient was urinating/moving their bowels without difficulty, tolerating a regular diet, pain is controlled with oral pain medications and they have been cleared by PT/OT.   Recent vital signs: No data found.   Recent laboratory studies: No results for input(s): WBC, HGB, HCT, PLT, NA, K, CL, CO2, BUN, CREATININE, GLUCOSE, INR, CALCIUM in the last 72 hours.  Invalid input(s): PT, 2   Discharge Medications:   Allergies as of 09/15/2020   No Known Allergies     Medication List    STOP  taking these medications   HYDROcodone-acetaminophen 5-325 MG tablet Commonly known as: NORCO/VICODIN Replaced by: HYDROcodone-acetaminophen 7.5-325 MG tablet     TAKE these medications   atorvastatin 10 MG tablet Commonly known as: LIPITOR TAKE 1 TABLET BY MOUTH EVERY DAY   b complex vitamins capsule Take 1 capsule by mouth daily.   Bayer Aspirin EC Low Dose 81 MG EC tablet Generic drug: aspirin Take 81 mg by mouth every morning.   busPIRone 7.5 MG tablet Commonly known as: BUSPAR 1-2 TAB BY MOUTH 3 TIMES A  DAY FOR ANXIETY What changed: See the new instructions.   cetirizine 10 MG tablet Commonly known as: ZYRTEC Take 10 mg by mouth daily. In the morning   Contour Next Test test strip Generic drug: glucose blood Check blood sugar once daily   docusate sodium 100 MG capsule Commonly known as: COLACE Take 1 capsule (100 mg total) by mouth 2 (two) times daily.   Eszopiclone 3 MG Tabs Take 1 tablet (3 mg total) by mouth at bedtime. Take immediately before bedtime   Ferretts 325 (106 Fe) MG Tabs tablet Generic drug: ferrous fumarate TAKE 1 TABLET (106 MG OF IRON TOTAL) BY MOUTH DAILY.   HYDROcodone-acetaminophen 7.5-325 MG tablet Commonly known as: NORCO Take 1 tablet by mouth every 4 (four) hours as needed for up to 7 days for moderate pain ((score 4 to 6)). Replaces: HYDROcodone-acetaminophen 5-325 MG tablet   Januvia 50 MG tablet Generic drug: sitaGLIPtin TAKE 1 TABLET BY MOUTH EVERY DAY What changed: how much to take   lisinopril 5 MG tablet Commonly known as: ZESTRIL TAKE 1 TABLET BY MOUTH TWICE A DAY What changed: when to take this   metFORMIN 1000 MG tablet Commonly known as: GLUCOPHAGE TAKE 1 TABLET (1,000 MG TOTAL) BY MOUTH 2 (TWO) TIMES DAILY WITH A MEAL.   methocarbamol 500 MG tablet Commonly known as: ROBAXIN Take 1 tablet (500 mg total) by mouth every 8 (eight) hours as needed for muscle spasms (Use Second).   pantoprazole 40 MG tablet Commonly known as: PROTONIX TAKE 1 TABLET BY MOUTH TWICE A DAY BEFORE A MEAL What changed: See the new instructions.   pregabalin 75 MG capsule Commonly known as: LYRICA TAKE 1 CAPSULE BY MOUTH TWICE A DAY   PreserVision AREDS 2 Caps Take 1 capsule by mouth 2 (two) times daily.   sucralfate 1 g tablet Commonly known as: CARAFATE TAKE 1 TABLET (1 G TOTAL) BY MOUTH 4 (FOUR) TIMES DAILY - WITH MEALS AND AT BEDTIME.   tamsulosin 0.4 MG Caps capsule Commonly known as: FLOMAX Take 2 capsules (0.8 mg total) by mouth daily.    testosterone cypionate 200 MG/ML injection Commonly known as: DEPOTESTOSTERONE CYPIONATE Inject 1 mL (200 mg total) into the muscle every 14 (fourteen) days. What changed:   when to take this  additional instructions   tiZANidine 4 MG tablet Commonly known as: ZANAFLEX TAKE 1.5 TABLETS (6 MG TOTAL) BY MOUTH EVERY 6 (SIX) HOURS AS NEEDED FOR MUSCLE SPASMS.   venlafaxine XR 150 MG 24 hr capsule Commonly known as: EFFEXOR-XR TAKE 1 CAPSULE (150 MG TOTAL) BY MOUTH DAILY WITH BREAKFAST.       Diagnostic Studies: DG Thoracic Spine 2 View  Result Date: 09/14/2020 CLINICAL DATA:  Removal of paraspinal fusion rods and pedicular screws at T5 and T6. EXAM: THORACIC SPINE 2 VIEWS; DG C-ARM 1-60 MIN COMPARISON:  Thoracic spine MRI-04/23/2020 Thoracic spine radiographs-03/26/2020; 02/27/2019 Thoracic spine CT-07/26/2019 FLUOROSCOPY TIME:  13 seconds (3.5 mGy) FINDINGS:  2 spot intraoperative fluoroscopic images of the mid/superior aspect of the thoracic spine are provided for review. Interval removal of the paraspinal fusion hardware superior the T7 vertebral body as well as the bilateral pedicular screws at T5 and T6, however the anterior most aspects of the right pedicular screw at T5 and the left pedicular screw at T6 remain. Surgical support apparatus is seen about the operative site including a forceps device posterior to the right T5 pedicle. IMPRESSION: Interval removal of paraspinal fusion hardware at T5 and T6 with retention of the anterior most aspects the right pedicular screw at T5 and the left pedicular screw at T7. Electronically Signed   By: Sandi Mariscal M.D.   On: 09/14/2020 10:22   DG C-Arm 1-60 Min  Result Date: 09/14/2020 CLINICAL DATA:  Removal of paraspinal fusion rods and pedicular screws at T5 and T6. EXAM: THORACIC SPINE 2 VIEWS; DG C-ARM 1-60 MIN COMPARISON:  Thoracic spine MRI-04/23/2020 Thoracic spine radiographs-03/26/2020; 02/27/2019 Thoracic spine CT-07/26/2019 FLUOROSCOPY  TIME:  13 seconds (3.5 mGy) FINDINGS: 2 spot intraoperative fluoroscopic images of the mid/superior aspect of the thoracic spine are provided for review. Interval removal of the paraspinal fusion hardware superior the T7 vertebral body as well as the bilateral pedicular screws at T5 and T6, however the anterior most aspects of the right pedicular screw at T5 and the left pedicular screw at T6 remain. Surgical support apparatus is seen about the operative site including a forceps device posterior to the right T5 pedicle. IMPRESSION: Interval removal of paraspinal fusion hardware at T5 and T6 with retention of the anterior most aspects the right pedicular screw at T5 and the left pedicular screw at T7. Electronically Signed   By: Sandi Mariscal M.D.   On: 09/14/2020 10:22    Discharge Instructions    Call MD / Call 911   Complete by: As directed    If you experience chest pain or shortness of breath, CALL 911 and be transported to the hospital emergency room.  If you develope a fever above 101 F, pus (white drainage) or increased drainage or redness at the wound, or calf pain, call your surgeon's office.   Constipation Prevention   Complete by: As directed    Drink plenty of fluids.  Prune juice may be helpful.  You may use a stool softener, such as Colace (over the counter) 100 mg twice a day.  Use MiraLax (over the counter) for constipation as needed.   Diet - low sodium heart healthy   Complete by: As directed    Discharge instructions   Complete by: As directed    No lifting greater than 10 lbs. Avoid bending, stooping and twisting. Walk in house for first week them may start to get out slowly increasing distance up to one mile by 3 weeks post op. Keep incision dry for 3 days, may use tegaderm or similar water impervious dressing. Avoid overhead use of arms and use of arms as this will pull on the area of the incision, move the arms and maintain Range of motion but avioid stretching.   Driving  restrictions   Complete by: As directed    No driving for 6 weeks   Increase activity slowly as tolerated   Complete by: As directed    Lifting restrictions   Complete by: As directed    No lifting for 6 weeks       Follow-up Information    Jessy Oto, MD In 2 weeks.  Specialty: Orthopedic Surgery Why: For wound re-check Contact information: [address] [address] [phone]               Discharge Plan:  discharge to home  Disposition:     Signed: Benjiman Core  09/25/2020, 3:35 PM

## 2020-09-28 ENCOUNTER — Ambulatory Visit: Payer: Medicare Other | Admitting: Internal Medicine

## 2020-09-28 ENCOUNTER — Encounter: Payer: Self-pay | Admitting: Internal Medicine

## 2020-09-28 ENCOUNTER — Telehealth: Payer: Self-pay

## 2020-09-28 ENCOUNTER — Other Ambulatory Visit: Payer: Self-pay

## 2020-09-28 VITALS — BP 179/76 | HR 71 | Temp 98.6°F | Resp 16 | Ht 69.0 in | Wt 167.0 lb

## 2020-09-28 DIAGNOSIS — M8660 Other chronic osteomyelitis, unspecified site: Secondary | ICD-10-CM

## 2020-09-28 DIAGNOSIS — T847XXA Infection and inflammatory reaction due to other internal orthopedic prosthetic devices, implants and grafts, initial encounter: Secondary | ICD-10-CM

## 2020-09-28 DIAGNOSIS — M4624 Osteomyelitis of vertebra, thoracic region: Secondary | ICD-10-CM

## 2020-09-28 NOTE — Telephone Encounter (Signed)
New PICC IV medication 450 mg Daptomycin once daily x 6 weeks per Dr. Gale Journey. [facility] notified(Tim) and orders placed and faxed. PICC placement scheduled with Caryl Pina at [facility]  for 10/02/20 at 10 am. Patient aware, accepted appt. First Dose to be administered in Short Stay after PICC Placement. [facility] made aware of time and date and first dose at [facility]. Orders also faxed to Short Stay.

## 2020-09-28 NOTE — Addendum Note (Signed)
Addended byPrudencio Pair T on: 09/28/2020 12:33 PM   Modules accepted: Orders

## 2020-09-28 NOTE — Progress Notes (Signed)
Subjective:  Chief complaint: back pain and hardware associated spine infection  [name], male    DOB: 11-28-1945, 75 y.o.   MRN: [id number]  HPI  51 -year-old man with a history of degenerative disc disease and multiple spinal fusions, previously infected with p-acnes, now here for f/u for staph epi hardware associated osteomyelitis  See background below. In 07/2020 ID visit, patient's doxycycline was stopped as he was on several medication and would like to cut back, and crp previously was improving.  He described chronic pain in the back for several years but it has gotten worsen 4-6 weeks prior to the last surgery to the point he couldn't ambulate/move around well  He had a thoracic spine mri 04/2020 "Although endplate destructive change at T4-5 has progressed, the overall appearance of discitis and osteomyelitis at T4-5 is improved. Previously seen epidural abscess has resolved.  Decreased marrow edema in the T6 vertebral body consistent with improved osteomyelitis.  Fusion across the T9-10 interspace where the patient previously had discitis and osteomyelitis."  He ultimately was taken for surgery on 09/14/2020 Patient has been on chronic pain management with opioid He also describes being more sleeping/tired during the past several weeks with the pain; he did increased his hydrocodone 5->7.5 six times a day. Also takes lyrica, zanaflex.  Since surgery patient had backed off of hydrocodone to 5 times a day. He had noticed obvious improvement to 4/10. Before surgery was 8-9/10 even with medication  I reviewed the surgical op note: 09/14/2020 removal of rods/pedicle screws of t5/t6, exploration of fusion, biopsy of transpedicular t5 and t6. 90 minute surgery, no pus seen.   Since the surgery 2/28 he has been on bactrim ds 1 tablet po bid  He wonders why he is here today in the id clinic He is happy with how his back is currently feeling   Background  from 07/2020 dr Lucianne Lei dam's note -----------  He has a rather complex surgical history which in regards to his back began in 2015 Dr. Agapito Games performed an T8-L1 fusion to assist with left leg radicular pain and numbness.  Despite multiple visits to see his surgeon at [facility] postoperatively, the patient states that his left leg numbness and direct low thoracic back pain returned within months after his surgery.  Imaging in 2018 showed that he had an L5-S1 rod breakage and loosening of the pedicle screws at that level.  He sought a second opinion with Dr. Louanne Skye late in 2018.  In November 2018, he had a replacement of the L5-S1 fusion segment and bone grafting was performed simultaneously.  Per the operative note reviewed, the broken rod was left intact at that time.  He reports several partial or near falls following this procedure, but he denies any total falls or direct trauma to his back in the past several years.  As his back pain and left leg radicular symptoms gradually returned, further imaging was performed that suggested screw loosening at the T9-T11 section of his spinal fusion.  On February 12, 2019 he returned to the OR with Dr. Louanne Skye and was found to have screw loosening at the same levels, so his fusion was extended superiorly to T5 and bone grafting was performed.  Initially, the patient had a good postoperative course, but over weeks 2 and 3 postoperatively, he began developing pain in his back at his operative site, prompting plain films to be obtained.  X-rays showed a loss of fixation of the opposite  left internal rod fastener at the site of his severe back pain and the development of acute loosening of the interval rod fasteners and loss of fixation.  On March 12, 2019, he was taken back to the operating room where it was noted that his Harrington rod had loosened and nearly entirely migrated out of position.  A culture was obtained near the pedicle screw at T10 as a small amount of soft  tissue with significant bone osteomalacia was observed.  Given concern for his spinal stability, his recently placed Harrington rods were removed and revised/replaced.  Operative cultures were followed and remained negative at the time of his hospital discharge on March 14, 2019.  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    He was then placed on ceftriaxone in October 2020 with improvement in his symptoms and his inflammatory markers.  He completed his the ceftriaxone and changed over to doxycycline.  He saw Dr. Linus Salmons in February and seemed to be doing relatively well.  Since then he did have some episodes of feeling poorly and feeling flushed and feeling like "something not right I think I am sick".   Dr. 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 Extensive ventral and dorsal epidural thickening and enhancement around the cord from T4 there approximately T6 compatible with epidural infection. And probable osteomyelitis of T6.  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  Dr. Louanne Skye called me arrange for the patient be seen in clinic   On exam he appeared stable and does not have any new neurological deficits.  Dr. Louanne Skye asked radiology to aspirate the disc for culture but they were unable to do so.  We obtained blood cultures and clinic and these were with no out any growth.   We r initiated him on ceftriaxone on April 6.  He was tolerating the antibiotics foot relatively well though he complains of some dark stools that he thinks might be due to the antibiotics.  He has continued ceftriaxone and then changed over to doxycycline.  MRI of the spine ierformed on Dec 06, 2019 and I have reviewed and agree with the findings when they are official report is  showing  T4-5 discitis osteomyelitis. Decreased conspicuity of ventral epidural abscess, epidural phlegmon and spinal canal narrowing at the T4-5 level.  T4-5 cord edema, unchanged.  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  Partially imaged posterior spinal fusion hardware. Please note lack of intravenous contrast and susceptibility artifact limits Evaluation.  Interim he developed profoundl fatigued and apparently had lost weight over the last several months.  He has some dizziness which seems to have preceded him being on doxycycline   We switched him from doxycycline to amoxicillin during that visit  Since then he was actually found to have significant gastric ulcers.  He is been taken off nonsteroidals and has not been drinking alcohol for the last 4 months.  On a PPI and Carafate  He has been putting back on weight and feeling much better his energy is improving appetite improving.  Still having some low back pain but this was much improved.  He was back on doxycycline instead of amoxicillin.  I saw him in August and we planned to have a repeat MRI which now was done this week.  Repeat MRI in April 23, 2020 shows:  Destructive changes at T4 and T5 that is progressed but overall seem to have improved in periods as far as his discitis and osteomyelitis in this region.  Dural abscess previously seen is completely resolved.  There is decreased marrow edema in the T6 vertebral body.  There is a fusion across the T9-T10 space where he previously had discitis and osteomyelitis.  Been found to have loosening hardware at T5 and T6 which Dr. Louanne Skye does not think is due to infection.  He would like to remove the loosened hardware which includes rods and screws and apply a local bone graft and allograft.  The patient is anxious to come off antibiotics.  I was initially apprehensive about him coming off antibiotics because of concern that Dr. Louanne Skye might place  hardware.  It sounds like that is not the plan but rather removal of hardware as planned.  Patient does continue to have mid back pain which can be severe at times up to 8-10 out of severity but he is able to get relief by changing position.  His peptic ulcer disease is improved with stopping nonsteroidals and he is cut out alcohol lately.  It is difficult for him to distinguish the pain he had from infection versus the pain he has from the hardware that is loose.  At our last visit our plan was to trial him off antibiotics if his inflammatory markers are reassuring, however he appears to have stayed on antibiotics even though inflammatory markers were normal.  Have told him today we will check the labs today but that he should leave and stop antibiotics and that we will call him if we want him to start back on.  Is elective surgery with Dr. Louanne Skye has been delayed due to COVID-19.  I think having him off antibiotics for some period time before that surgery would be helpful and again if there is infection at the surgical screw site then Dr. Louanne Skye be able to get cultures in the operating room without the patient having been on prolonged months and months of antibiotics.   Patient also has recently lost his son who died of metastatic lung cancer that progressed rapidly after he was diagnosed with metastases to the brain.  His son was only 59 years of age.    Past Medical History:  Diagnosis Date  . Acute pharyngitis 10/21/2013  . Allergy    grass and pollen  . Anxiety and depression 10/25/2011  . BPH (benign prostatic hyperplasia) 04/23/2012  . Chicken pox as a child  . DDD (degenerative disc disease)    cervical responds to steroid injections and low back required surgery  . DDD (degenerative disc disease), lumbosacral   . Diabetes mellitus    pre  . Dyspnea   . ED (erectile dysfunction) 04/23/2012  . Elevated BP   . 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 Esophageal reflux 02/10/2015  . Fatigue    . HTN (hypertension)   . Hyperglycemia    preDM   . Hyperlipidemia   . Insomnia   . Low back pain radiating to both legs 01/16/2017  . Measles as a child  . Medicare annual wellness visit, subsequent 02/10/2015  . Overweight(278.02)   . Peripheral neuropathy 08/07/2019  . Personal history of colonic polyps 10/27/2012   Follows with [facility]  . Pneumonia    " walking"  . Preventative health care   . 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  . Wears glasses     Past Surgical History:  Procedure Laterality Date  . BACK SURGERY  2012 and 1994   Dr Margret Chance, screws and cage in low back  . BIOPSY  01/09/2020   Procedure: BIOPSY;  Surgeon: Otis Brace, MD;  Location: [facility];  Service: Gastroenterology;;  . COLONOSCOPY WITH PROPOFOL N/A 01/09/2020   Procedure: COLONOSCOPY WITH PROPOFOL;  Surgeon: Otis Brace, MD;  Location: [facility];  Service: Gastroenterology;  Laterality: N/A;  . ESOPHAGOGASTRODUODENOSCOPY (EGD) WITH PROPOFOL N/A 01/09/2020   Procedure: ESOPHAGOGASTRODUODENOSCOPY (EGD) WITH PROPOFOL;  Surgeon: Otis Brace, MD;  Location: [facility];  Service: Gastroenterology;  Laterality: N/A;  . EYE SURGERY Bilateral    2016  . HARDWARE REVISION  03/12/2019   REVISION OF HARDWARE THORACIC TEN-THORACIC ELEVEN WITH APPLICATION OF ADDITIONAL RODS AND ROD SLEEVES THORACIC EIGHT-THORACIC TWELVE 12-LUMBAR ONE (N/A )  . LAMINECTOMY  02/12/2019   DECOMPRESSIVE LAMINECTOMY THORACIC NINE-THORACIC TEN AND THORACIC TEN-THORACIC ELEVEN, EXTENSION OF THORACOLUMBAR FUSION FROM THORACIC TEN TO THORACIC FIVE, LOCAL BONE GRAFT, ALLOGRAFT AND VIVIGEN (N/A)  . POLYPECTOMY  01/09/2020   Procedure: POLYPECTOMY;  Surgeon: Otis Brace, MD;  Location: [facility];  Service: Gastroenterology;;  . REMOVAL OF RODS AND PEDICLE SCREWS T5 AND T6, EXPLORATION OF FUSION, BIOPSY TRANSPEDICULAR T5 AND T6 (N/A )  09/14/2020  . SPINAL FUSION N/A 02/12/2019   Procedure: DECOMPRESSIVE  LAMINECTOMY THORACIC NINE-THORACIC TEN AND THORACIC TEN-THORACIC ELEVEN, EXTENSION OF THORACOLUMBAR FUSION FROM THORACIC TEN TO THORACIC FIVE, LOCAL BONE GRAFT, ALLOGRAFT AND VIVIGEN;  Surgeon: Jessy Oto, MD;  Location: [facility];  Service: Orthopedics;  Laterality: N/A;  DECOMPRESSIVE LAMINECTOMY THORACIC NINE-THORACIC TEN AND THORACIC TEN-THORACIC ELEVEN, EXTENSION OF TH  . SPINAL FUSION N/A 03/12/2019   Procedure: REVISION OF HARDWARE THORACIC TEN-THORACIC ELEVEN WITH APPLICATION OF ADDITIONAL RODS AND ROD SLEEVES THORACIC EIGHT-THORACIC TWELVE 12-LUMBAR ONE;  Surgeon: Jessy Oto, MD;  Location: [facility];  Service: Orthopedics;  Laterality: N/A;  . TONSILLECTOMY    . TONSILLECTOMY     as child  . torn rotator cuff  2010   right    Family History  Problem Relation Age of Onset  . Hypertension Mother   . Diabetes Mother        type 2  . Cancer Mother 70       breast in remission  . Emphysema Father        smoker  . COPD Father        smoker  . Stroke Father 32       mini  . Heart disease Father   . Hypertension Sister   . Hyperlipidemia Sister   . Scoliosis Sister   . Osteoporosis Sister   . Hypertension Maternal Grandmother   . Scoliosis Maternal Grandmother   . Heart disease Maternal Grandfather   . Heart disease Paternal Grandfather        smoker  . Heart disease Daughter       Social History   Socioeconomic History  . Marital status: Married    Spouse name: Not on file  . Number of children: Not on file  . Years of education: Not on file  . Highest education level: Not on file  Occupational History  . Not on file  Tobacco Use  . Smoking status: Former Smoker    Packs/day: 1.00    Years: 20.00    Pack years: 20.00    Types: Cigarettes    Quit date: 07/18/1989    Years since quitting: 31.2  . Smokeless tobacco: Never Used  . Tobacco comment:  off and on smoking for the 20 yrs  Vaping Use  . Vaping Use: Never used  Substance and Sexual Activity  . Alcohol  use: Not Currently    Comment: drink or two a day- mixed drink  . Drug use: No  . Sexual activity: Yes    Comment: lives with wife, still working, no dietary restrictions, continues to exercise intermittently  Other Topics Concern  . Not on file  Social History Narrative  . Not on file   Social Determinants of Health   Financial Resource Strain: Not on file  Food Insecurity: Not on file  Transportation Needs: Not on file  Physical Activity: Not on file  Stress: Not on file  Social Connections: Not on file    No Known Allergies   Current Outpatient Medications:  .  atorvastatin (LIPITOR) 10 MG tablet, TAKE 1 TABLET BY MOUTH EVERY DAY (Patient taking differently: Take 10 mg by mouth daily.), Disp: 90 tablet, Rfl: 1 .  b complex vitamins capsule, Take 1 capsule by mouth daily., Disp: , Rfl:  .  BAYER ASPIRIN EC LOW DOSE 81 MG EC tablet, Take 81 mg by mouth every morning., Disp: , Rfl:  .  busPIRone (BUSPAR) 7.5 MG tablet, 1-2 TAB BY MOUTH 3 TIMES A DAY FOR ANXIETY (Patient taking differently: Take 7.5 mg by mouth 2 (two) times daily.), Disp: 540 tablet, Rfl: 1 .  cetirizine (ZYRTEC) 10 MG tablet, Take 10 mg by mouth daily. In the morning, Disp: , Rfl:  .  docusate sodium (COLACE) 100 MG capsule, Take 1 capsule (100 mg total) by mouth 2 (two) times daily., Disp: 30 capsule, Rfl: 0 .  Eszopiclone 3 MG TABS, Take 1 tablet (3 mg total) by mouth at bedtime. Take immediately before bedtime, Disp: 30 tablet, Rfl: 0 .  ferrous fumarate (FERRETTS) 325 (106 Fe) MG TABS tablet, TAKE 1 TABLET (106 MG OF IRON TOTAL) BY MOUTH DAILY., Disp: 30 tablet, Rfl: 2 .  glucose blood (CONTOUR NEXT TEST) test strip, Check blood sugar once daily, Disp: 100 each, Rfl: 12 .  JANUVIA 50 MG tablet, TAKE 1 TABLET BY MOUTH EVERY DAY (Patient taking differently: Take 50 mg by mouth daily.), Disp: 30 tablet, Rfl: 3 .  lisinopril (ZESTRIL) 5 MG tablet, TAKE 1 TABLET BY MOUTH TWICE A DAY (Patient taking differently: Take  5 mg by mouth daily.), Disp: 180 tablet, Rfl: 1 .  metFORMIN (GLUCOPHAGE) 1000 MG tablet, TAKE 1 TABLET (1,000 MG TOTAL) BY MOUTH 2 (TWO) TIMES DAILY WITH A MEAL., Disp: 180 tablet, Rfl: 0 .  pantoprazole (PROTONIX) 40 MG tablet, TAKE 1 TABLET BY MOUTH TWICE A DAY BEFORE A MEAL (Patient taking differently: Take 40 mg by mouth 2 (two) times daily before a meal.), Disp: 180 tablet, Rfl: 1 .  pregabalin (LYRICA) 75 MG capsule, TAKE 1 CAPSULE BY MOUTH TWICE A DAY (Patient taking differently: Take 75 mg by mouth 2 (two) times daily.), Disp: 60 capsule, Rfl: 3 .  sulfamethoxazole-trimethoprim (BACTRIM DS) 800-160 MG tablet, Take 1 tablet by mouth 2 (two) times daily., Disp: 28 tablet, Rfl: 0 .  tamsulosin (FLOMAX) 0.4 MG CAPS capsule, Take 2 capsules (0.8 mg total) by mouth daily., Disp: 180 capsule, Rfl: 3 .  testosterone cypionate (DEPOTESTOSTERONE CYPIONATE) 200 MG/ML injection, Inject 1 mL (200 mg total) into the muscle every 14 (fourteen) days. (Patient taking differently: Inject 200 mg into the muscle See admin instructions. Inject 200 mg every 16 days), Disp: 2 mL, Rfl: 0 .  tiZANidine (ZANAFLEX) 4 MG tablet, TAKE 1.5 TABLETS (6 MG TOTAL) BY MOUTH EVERY 6 (SIX) HOURS AS NEEDED FOR MUSCLE SPASMS., Disp: 540 tablet, Rfl: 1 .  venlafaxine XR (EFFEXOR-XR) 150 MG 24 hr capsule, TAKE 1 CAPSULE (150 MG TOTAL) BY MOUTH DAILY WITH BREAKFAST., Disp: 90 capsule, Rfl: 1 .  famotidine (PEPCID) 40 MG tablet, Take 40 mg by mouth at bedtime., Disp: , Rfl:  .  HYDROcodone-acetaminophen (NORCO) 7.5-325 MG tablet, Take 1 tablet by mouth every 4 (four) hours as needed for up to 7 days for moderate pain ((score 4 to 6))., Disp: 40 tablet, Rfl: 0  Current Facility-Administered Medications:  .  testosterone cypionate (DEPOTESTOSTERONE CYPIONATE) injection 200 mg, 200 mg, Intramuscular, Q14 Days, Saguier, Edward, PA-C, 200 mg at 09/11/20 6295   Review of Systems  Other ros negative     Objective:   Physical  Exam Constitutional:      General: He is not in acute distress.    Appearance: Normal appearance. He is well-developed. He is not ill-appearing or diaphoretic.  HENT:     Head: Normocephalic and atraumatic.     Right Ear: Hearing and external ear normal.     Left Ear: Hearing and external ear normal.     Nose: No nasal deformity or rhinorrhea.  Eyes:     General: No scleral icterus.    Conjunctiva/sclera: Conjunctivae normal.     Right eye: Right conjunctiva is not injected.     Left eye: Left conjunctiva is not injected.  Neck:     Vascular: No JVD.  Cardiovascular:     Rate and Rhythm: Normal rate and regular rhythm.     Heart sounds: S1 normal and S2 normal. No friction rub.  Pulmonary:     Effort: Pulmonary effort is normal. No respiratory distress.  Abdominal:     General: There is no distension.     Palpations: Abdomen is soft.  Musculoskeletal:        General: Normal range of motion.     Right shoulder: Normal.     Left shoulder: Normal.     Cervical back: Normal range of motion and neck supple.     Right hip: Normal.     Left hip: Normal.     Right knee: Normal.     Left knee: Normal.  Lymphadenopathy:     Head:     Right side of head: No submandibular, preauricular or posterior auricular adenopathy.     Left side of head: No submandibular, preauricular or posterior auricular adenopathy.     Cervical: No cervical adenopathy.     Right cervical: No superficial or deep cervical adenopathy.    Left cervical: No superficial or deep cervical adenopathy.  Skin:    General: Skin is warm and dry.     Coloration: Skin is not pale.     Findings: No abrasion, bruising, ecchymosis, erythema, lesion or rash.     Nails: There is no clubbing.  Neurological:     General: No focal deficit present.     Mental Status: He is alert and oriented to person, place, and time.     Sensory: No sensory deficit.     Coordination: Coordination normal.     Gait: Gait normal.  Psychiatric:         Attention and Perception: Attention and perception normal. He is attentive. He does not perceive auditory hallucinations.        Mood and Affect: Mood and affect normal. Mood is  not depressed.        Speech: Speech normal.        Behavior: Behavior normal. Behavior is cooperative.        Thought Content: Thought content normal.        Judgment: Judgment normal.           Assessment & Plan:  #recurrent spine OM associated with hardware.  Initially p acnes thoracic spine vertebral osteomyelitis with an epidural abscess. S/p ceftriaxone 6 weeks x2 with chronic doxycycline suppression for the most part until 07/2020. Improving crp and improved pain. Off abx 07/2020  Worsened lower back pain associated with partial hardware failure s/p 2/28 partial hardware adjustment. No pus found, however surgical culture grew staph epi R to methicillin and tetracycline, but sensitive to bactrim  Given no further p-acnes growth, reasonable to treat for staph epi. As bactrim high dose is ultimately toxic to kidney, will initially treat with iv daptomycin and transition to bactrim ds bid for up to 3 months after the initial 6 weeks dapto, as long as he can tolerate. Goal is to get him to bactrim SS bid then daily if possible    -continue bactrim ds bid for now until [facility] IV abx setup -daptomycin 450 mg (6mg /kg) daily once a day for 6 weeks -after the 6 weeks transition to bactrim ds bid again and will try to do 3 months on that then transition to SS bid then daily eventually -given extensive other hardware remaining behind, I will not use rifampin in his case -f/u 3-4 weeks   I spent 40 minute reviewing data/chart, and coordinating care and >50% direct face to face time providing counseling/discussing diagnostics/treatment plan with patient

## 2020-09-28 NOTE — Patient Instructions (Signed)
You do have another type of infection of your back/hardware. I think this contributes to the cause of hardware failure and increased pain  Unfortunately this bacteria (staph epididermis) doesn't respond to doxycycline. Oral pill that works against this is only bactrim at this time (sulfamethoxazole), but this will cause kidney injury at high dose if used too long  Another unfortunate fact is that the previous infection with p acnes and this infection can hide for a long time (doesn't cause much of increased inflammatory markers)   At this time, will be aggressive in reducing the bacterial load with an IV antibiotics. At around 5-6 weeks will transition back to sulfamethoxazole high dose for several weeks then lower dose indefinitely.  Will get some blood tests today, and set you up with home health for IV antibiotics  Please follow up in around 3-4 weeks with me

## 2020-09-29 ENCOUNTER — Other Ambulatory Visit: Payer: Self-pay

## 2020-09-29 ENCOUNTER — Other Ambulatory Visit: Payer: Self-pay | Admitting: Specialist

## 2020-09-29 ENCOUNTER — Other Ambulatory Visit: Payer: Self-pay | Admitting: Family Medicine

## 2020-09-29 ENCOUNTER — Ambulatory Visit (INDEPENDENT_AMBULATORY_CARE_PROVIDER_SITE_OTHER): Payer: Medicare Other

## 2020-09-29 DIAGNOSIS — R7989 Other specified abnormal findings of blood chemistry: Secondary | ICD-10-CM | POA: Diagnosis not present

## 2020-09-29 DIAGNOSIS — E349 Endocrine disorder, unspecified: Secondary | ICD-10-CM

## 2020-09-29 LAB — CBC WITH DIFFERENTIAL/PLATELET
Absolute Monocytes: 755 cells/uL (ref 200–950)
Basophils Absolute: 49 cells/uL (ref 0–200)
Basophils Relative: 0.5 %
Eosinophils Absolute: 118 cells/uL (ref 15–500)
Eosinophils Relative: 1.2 %
HCT: 37.9 % — ABNORMAL LOW (ref 38.5–50.0)
Hemoglobin: 12.9 g/dL — ABNORMAL LOW (ref 13.2–17.1)
Lymphs Abs: 578 cells/uL — ABNORMAL LOW (ref 850–3900)
MCH: 31.8 pg (ref 27.0–33.0)
MCHC: 34 g/dL (ref 32.0–36.0)
MCV: 93.3 fL (ref 80.0–100.0)
MPV: 8.7 fL (ref 7.5–12.5)
Monocytes Relative: 7.7 %
Neutro Abs: 8301 cells/uL — ABNORMAL HIGH (ref 1500–7800)
Neutrophils Relative %: 84.7 %
Platelets: 380 10*3/uL (ref 140–400)
RBC: 4.06 10*6/uL — ABNORMAL LOW (ref 4.20–5.80)
RDW: 12.5 % (ref 11.0–15.0)
Total Lymphocyte: 5.9 %
WBC: 9.8 10*3/uL (ref 3.8–10.8)

## 2020-09-29 LAB — C-REACTIVE PROTEIN: CRP: 33.1 mg/L — ABNORMAL HIGH (ref <8.0)

## 2020-09-29 MED ORDER — TESTOSTERONE CYPIONATE 200 MG/ML IM SOLN: 200.0000 mg | INTRAMUSCULAR | Status: DC

## 2020-09-29 MED ORDER — TESTOSTERONE CYPIONATE 200 MG/ML IM SOLN
200.0000 mg | Freq: Once | INTRAMUSCULAR | Status: AC
Start: 2020-09-29 — End: 2020-09-29
  Administered 2020-09-29: 200 mg via INTRAMUSCULAR

## 2020-09-29 NOTE — Progress Notes (Addendum)
Luis Brown is a 75 y.o. male presents to the office today for testosterone injections, per physician's orders. Original order: placed over 5 years ago Testosterone 200 mL was administered in right ventral gluteal today.  Patient tolerated injection. Patient due for follow up labs/provider appt: yes per last OV note. Patient next injection due: in 16 days, appt made yes  Creft, Darlis Loan, RMA  [employer], PA-C

## 2020-09-30 ENCOUNTER — Other Ambulatory Visit: Payer: Self-pay | Admitting: Medical

## 2020-10-01 ENCOUNTER — Encounter: Payer: Self-pay | Admitting: Surgery

## 2020-10-01 ENCOUNTER — Telehealth: Payer: Self-pay | Admitting: Medical

## 2020-10-01 ENCOUNTER — Inpatient Hospital Stay: Payer: Medicare Other | Admitting: Surgery

## 2020-10-01 ENCOUNTER — Ambulatory Visit (INDEPENDENT_AMBULATORY_CARE_PROVIDER_SITE_OTHER): Payer: Medicare Other | Admitting: Surgery

## 2020-10-01 ENCOUNTER — Ambulatory Visit: Payer: Self-pay

## 2020-10-01 ENCOUNTER — Other Ambulatory Visit (HOSPITAL_COMMUNITY): Payer: Self-pay | Admitting: *Deleted

## 2020-10-01 ENCOUNTER — Encounter: Payer: Self-pay | Admitting: Medical

## 2020-10-01 ENCOUNTER — Other Ambulatory Visit: Payer: Self-pay

## 2020-10-01 DIAGNOSIS — Z9889 Other specified postprocedural states: Secondary | ICD-10-CM

## 2020-10-01 DIAGNOSIS — M546 Pain in thoracic spine: Secondary | ICD-10-CM

## 2020-10-01 NOTE — Telephone Encounter (Signed)
Opened to review 

## 2020-10-01 NOTE — Progress Notes (Signed)
75 year old white male status post thoracic spine hardware removal returns.  States that he has noticed some improvement of his back pain.  States that he is scheduled to have PICC line placed tomorrow per infectious disease.   Exam Very pleasant white male alert and oriented in no acute distress.  Wound looks good.  Staples removed and Steri-Strips applied.  No drainage or signs of infection.   Plan Patient will proceed with PICC line placement.  Gradual increase his walking activity.  Nothing aggressive.  Will follow-up with Dr. Louanne Skye in 4 weeks for recheck.  All questions answered.  X-rays reviewed with patient.  No complicating features.

## 2020-10-02 ENCOUNTER — Ambulatory Visit (HOSPITAL_COMMUNITY)
Admission: RE | Admit: 2020-10-02 | Discharge: 2020-10-02 | Disposition: A | Discharge status: Home / Self Care | Payer: Medicare Other | Source: Ambulatory Visit | Attending: Internal Medicine | Admitting: Internal Medicine

## 2020-10-02 ENCOUNTER — Encounter (HOSPITAL_COMMUNITY)
Admission: RE | Admit: 2020-10-02 | Discharge: 2020-10-02 | Disposition: A | Discharge status: Home / Self Care | Payer: Medicare Other | Source: Ambulatory Visit | Attending: Internal Medicine | Admitting: Internal Medicine

## 2020-10-02 DIAGNOSIS — Y839 Surgical procedure, unspecified as the cause of abnormal reaction of the patient, or of later complication, without mention of misadventure at the time of the procedure: Secondary | ICD-10-CM | POA: Insufficient documentation

## 2020-10-02 DIAGNOSIS — M4624 Osteomyelitis of vertebra, thoracic region: Secondary | ICD-10-CM | POA: Diagnosis not present

## 2020-10-02 DIAGNOSIS — T847XXA Infection and inflammatory reaction due to other internal orthopedic prosthetic devices, implants and grafts, initial encounter: Secondary | ICD-10-CM | POA: Diagnosis not present

## 2020-10-02 DIAGNOSIS — Z452 Encounter for adjustment and management of vascular access device: Secondary | ICD-10-CM | POA: Diagnosis not present

## 2020-10-02 DIAGNOSIS — M8660 Other chronic osteomyelitis, unspecified site: Secondary | ICD-10-CM | POA: Diagnosis not present

## 2020-10-02 MED ORDER — HEPARIN SOD (PORK) LOCK FLUSH 100 UNIT/ML IV SOLN: 250.0000 [IU] | Freq: Every day | INTRAVENOUS | Status: DC

## 2020-10-02 MED ORDER — SODIUM CHLORIDE 0.9 % IV SOLN
450.0000 mg | Freq: Once | INTRAVENOUS | Status: AC
  Administered 2020-10-02: 450 mg via INTRAVENOUS
  Filled 2020-10-02: qty 9

## 2020-10-02 MED ORDER — LIDOCAINE HCL 1 % IJ SOLN
INTRAMUSCULAR | Status: AC
  Filled 2020-10-02: qty 20

## 2020-10-02 MED ORDER — LIDOCAINE HCL 1 % IJ SOLN
INTRAMUSCULAR | Status: DC | PRN
  Administered 2020-10-02: 5 mL

## 2020-10-02 MED ORDER — HEPARIN SOD (PORK) LOCK FLUSH 100 UNIT/ML IV SOLN: 250.0000 [IU] | INTRAVENOUS | Status: DC | PRN

## 2020-10-02 MED ORDER — HEPARIN SOD (PORK) LOCK FLUSH 100 UNIT/ML IV SOLN
INTRAVENOUS | Status: AC
  Administered 2020-10-02: 250 [IU]
  Filled 2020-10-02: qty 5

## 2020-10-02 NOTE — Procedures (Signed)
PROCEDURE SUMMARY:  Successful placement of image-guided single lumen PICC line to the right basilic vein. Length 40 cm. Tip at lower SVC/RA. No complications. EBL = <5 ml. Ready for use.  Please see imaging section of Epic for full dictation.   Theresa Duty, NP 10/02/2020 10:50 AM

## 2020-10-03 DIAGNOSIS — M4635 Infection of intervertebral disc (pyogenic), thoracolumbar region: Secondary | ICD-10-CM | POA: Diagnosis not present

## 2020-10-03 DIAGNOSIS — G062 Extradural and subdural abscess, unspecified: Secondary | ICD-10-CM | POA: Diagnosis not present

## 2020-10-03 DIAGNOSIS — M4325 Fusion of spine, thoracolumbar region: Secondary | ICD-10-CM | POA: Diagnosis not present

## 2020-10-04 DIAGNOSIS — G062 Extradural and subdural abscess, unspecified: Secondary | ICD-10-CM | POA: Diagnosis not present

## 2020-10-04 DIAGNOSIS — M4635 Infection of intervertebral disc (pyogenic), thoracolumbar region: Secondary | ICD-10-CM | POA: Diagnosis not present

## 2020-10-04 DIAGNOSIS — M4325 Fusion of spine, thoracolumbar region: Secondary | ICD-10-CM | POA: Diagnosis not present

## 2020-10-05 ENCOUNTER — Telehealth: Payer: Self-pay | Admitting: Medical

## 2020-10-05 DIAGNOSIS — M4635 Infection of intervertebral disc (pyogenic), thoracolumbar region: Secondary | ICD-10-CM | POA: Diagnosis not present

## 2020-10-05 DIAGNOSIS — M4325 Fusion of spine, thoracolumbar region: Secondary | ICD-10-CM | POA: Diagnosis not present

## 2020-10-05 DIAGNOSIS — G062 Extradural and subdural abscess, unspecified: Secondary | ICD-10-CM | POA: Diagnosis not present

## 2020-10-05 MED ORDER — SUCRALFATE 1 G PO TABS: 1.0000 g | ORAL_TABLET | Freq: Three times a day (TID) | ORAL | 1 refills | Status: DC

## 2020-10-05 NOTE — Telephone Encounter (Signed)
Rx sucraflate sent to pharmacy.

## 2020-10-05 NOTE — Telephone Encounter (Signed)
Opened to send medications sucraflate.

## 2020-10-06 ENCOUNTER — Encounter: Payer: Self-pay | Admitting: Medical

## 2020-10-06 DIAGNOSIS — M4635 Infection of intervertebral disc (pyogenic), thoracolumbar region: Secondary | ICD-10-CM | POA: Diagnosis not present

## 2020-10-06 DIAGNOSIS — G062 Extradural and subdural abscess, unspecified: Secondary | ICD-10-CM | POA: Diagnosis not present

## 2020-10-06 DIAGNOSIS — M4325 Fusion of spine, thoracolumbar region: Secondary | ICD-10-CM | POA: Diagnosis not present

## 2020-10-06 DIAGNOSIS — M8660 Other chronic osteomyelitis, unspecified site: Secondary | ICD-10-CM | POA: Diagnosis not present

## 2020-10-07 DIAGNOSIS — M4325 Fusion of spine, thoracolumbar region: Secondary | ICD-10-CM | POA: Diagnosis not present

## 2020-10-07 DIAGNOSIS — M4635 Infection of intervertebral disc (pyogenic), thoracolumbar region: Secondary | ICD-10-CM | POA: Diagnosis not present

## 2020-10-07 DIAGNOSIS — G062 Extradural and subdural abscess, unspecified: Secondary | ICD-10-CM | POA: Diagnosis not present

## 2020-10-08 ENCOUNTER — Telehealth: Payer: Self-pay | Admitting: Medical

## 2020-10-08 DIAGNOSIS — G062 Extradural and subdural abscess, unspecified: Secondary | ICD-10-CM | POA: Diagnosis not present

## 2020-10-08 DIAGNOSIS — M4325 Fusion of spine, thoracolumbar region: Secondary | ICD-10-CM | POA: Diagnosis not present

## 2020-10-08 DIAGNOSIS — M4635 Infection of intervertebral disc (pyogenic), thoracolumbar region: Secondary | ICD-10-CM | POA: Diagnosis not present

## 2020-10-08 MED ORDER — ZOLPIDEM TARTRATE 5 MG PO TABS: 5.0000 mg | ORAL_TABLET | Freq: Every evening | ORAL | 0 refills | Status: DC | PRN

## 2020-10-08 NOTE — Telephone Encounter (Signed)
Rx ambien sent to pt pharmacy.

## 2020-10-08 NOTE — Telephone Encounter (Signed)
Dc lunesta.

## 2020-10-09 DIAGNOSIS — G062 Extradural and subdural abscess, unspecified: Secondary | ICD-10-CM | POA: Diagnosis not present

## 2020-10-09 DIAGNOSIS — M4325 Fusion of spine, thoracolumbar region: Secondary | ICD-10-CM | POA: Diagnosis not present

## 2020-10-09 DIAGNOSIS — M4635 Infection of intervertebral disc (pyogenic), thoracolumbar region: Secondary | ICD-10-CM | POA: Diagnosis not present

## 2020-10-10 DIAGNOSIS — M4635 Infection of intervertebral disc (pyogenic), thoracolumbar region: Secondary | ICD-10-CM | POA: Diagnosis not present

## 2020-10-10 DIAGNOSIS — G062 Extradural and subdural abscess, unspecified: Secondary | ICD-10-CM | POA: Diagnosis not present

## 2020-10-10 DIAGNOSIS — M4325 Fusion of spine, thoracolumbar region: Secondary | ICD-10-CM | POA: Diagnosis not present

## 2020-10-11 DIAGNOSIS — G062 Extradural and subdural abscess, unspecified: Secondary | ICD-10-CM | POA: Diagnosis not present

## 2020-10-11 DIAGNOSIS — M4635 Infection of intervertebral disc (pyogenic), thoracolumbar region: Secondary | ICD-10-CM | POA: Diagnosis not present

## 2020-10-11 DIAGNOSIS — M4325 Fusion of spine, thoracolumbar region: Secondary | ICD-10-CM | POA: Diagnosis not present

## 2020-10-12 ENCOUNTER — Encounter: Payer: Self-pay | Admitting: Specialist

## 2020-10-12 DIAGNOSIS — M4325 Fusion of spine, thoracolumbar region: Secondary | ICD-10-CM | POA: Diagnosis not present

## 2020-10-12 DIAGNOSIS — M4635 Infection of intervertebral disc (pyogenic), thoracolumbar region: Secondary | ICD-10-CM | POA: Diagnosis not present

## 2020-10-12 DIAGNOSIS — G062 Extradural and subdural abscess, unspecified: Secondary | ICD-10-CM | POA: Diagnosis not present

## 2020-10-12 NOTE — Telephone Encounter (Signed)
Luis Brown wanted to cancel this

## 2020-10-13 ENCOUNTER — Encounter: Payer: Self-pay | Admitting: Medical

## 2020-10-13 DIAGNOSIS — M4325 Fusion of spine, thoracolumbar region: Secondary | ICD-10-CM | POA: Diagnosis not present

## 2020-10-13 DIAGNOSIS — M8660 Other chronic osteomyelitis, unspecified site: Secondary | ICD-10-CM | POA: Diagnosis not present

## 2020-10-13 DIAGNOSIS — M4635 Infection of intervertebral disc (pyogenic), thoracolumbar region: Secondary | ICD-10-CM | POA: Diagnosis not present

## 2020-10-13 DIAGNOSIS — G062 Extradural and subdural abscess, unspecified: Secondary | ICD-10-CM | POA: Diagnosis not present

## 2020-10-13 NOTE — Telephone Encounter (Signed)
Can you drop orders?

## 2020-10-14 DIAGNOSIS — G062 Extradural and subdural abscess, unspecified: Secondary | ICD-10-CM | POA: Diagnosis not present

## 2020-10-14 DIAGNOSIS — M4325 Fusion of spine, thoracolumbar region: Secondary | ICD-10-CM | POA: Diagnosis not present

## 2020-10-14 DIAGNOSIS — M4635 Infection of intervertebral disc (pyogenic), thoracolumbar region: Secondary | ICD-10-CM | POA: Diagnosis not present

## 2020-10-15 ENCOUNTER — Ambulatory Visit: Payer: Medicare Other

## 2020-10-15 ENCOUNTER — Ambulatory Visit: Payer: Medicare Other | Admitting: Infectious Disease

## 2020-10-15 ENCOUNTER — Encounter: Payer: Self-pay | Admitting: Specialist

## 2020-10-15 ENCOUNTER — Other Ambulatory Visit: Payer: Self-pay

## 2020-10-15 DIAGNOSIS — M4325 Fusion of spine, thoracolumbar region: Secondary | ICD-10-CM | POA: Diagnosis not present

## 2020-10-15 DIAGNOSIS — R7989 Other specified abnormal findings of blood chemistry: Secondary | ICD-10-CM

## 2020-10-15 DIAGNOSIS — G062 Extradural and subdural abscess, unspecified: Secondary | ICD-10-CM | POA: Diagnosis not present

## 2020-10-15 DIAGNOSIS — M4635 Infection of intervertebral disc (pyogenic), thoracolumbar region: Secondary | ICD-10-CM | POA: Diagnosis not present

## 2020-10-15 NOTE — Progress Notes (Addendum)
Luis Brown is a 75 y.o. male presents to the office today for Testosterone cypionate 200 mg/ml injections (patinent provided medication with exp date of 03/2023 and lot #[id number]), per physician's orders. Original order was started about 5 years ago.  Testosterone was administered IM  today. Patient tolerated injection.  Patient next injection due in about 2 weeks, appt made for 11-03-2020 due to holiday.   Jolene Provost Rennis Petty, PA-C

## 2020-10-16 DIAGNOSIS — G062 Extradural and subdural abscess, unspecified: Secondary | ICD-10-CM | POA: Diagnosis not present

## 2020-10-16 DIAGNOSIS — M4635 Infection of intervertebral disc (pyogenic), thoracolumbar region: Secondary | ICD-10-CM | POA: Diagnosis not present

## 2020-10-16 DIAGNOSIS — M4325 Fusion of spine, thoracolumbar region: Secondary | ICD-10-CM | POA: Diagnosis not present

## 2020-10-16 NOTE — Telephone Encounter (Signed)
Patient called; appointment scheduled.

## 2020-10-17 DIAGNOSIS — M4325 Fusion of spine, thoracolumbar region: Secondary | ICD-10-CM | POA: Diagnosis not present

## 2020-10-17 DIAGNOSIS — G062 Extradural and subdural abscess, unspecified: Secondary | ICD-10-CM | POA: Diagnosis not present

## 2020-10-17 DIAGNOSIS — M4635 Infection of intervertebral disc (pyogenic), thoracolumbar region: Secondary | ICD-10-CM | POA: Diagnosis not present

## 2020-10-18 DIAGNOSIS — M4635 Infection of intervertebral disc (pyogenic), thoracolumbar region: Secondary | ICD-10-CM | POA: Diagnosis not present

## 2020-10-18 DIAGNOSIS — M4325 Fusion of spine, thoracolumbar region: Secondary | ICD-10-CM | POA: Diagnosis not present

## 2020-10-18 DIAGNOSIS — G062 Extradural and subdural abscess, unspecified: Secondary | ICD-10-CM | POA: Diagnosis not present

## 2020-10-19 ENCOUNTER — Other Ambulatory Visit: Payer: Self-pay | Admitting: Medical

## 2020-10-19 DIAGNOSIS — G062 Extradural and subdural abscess, unspecified: Secondary | ICD-10-CM | POA: Diagnosis not present

## 2020-10-19 DIAGNOSIS — M4325 Fusion of spine, thoracolumbar region: Secondary | ICD-10-CM | POA: Diagnosis not present

## 2020-10-19 DIAGNOSIS — M4635 Infection of intervertebral disc (pyogenic), thoracolumbar region: Secondary | ICD-10-CM | POA: Diagnosis not present

## 2020-10-20 ENCOUNTER — Ambulatory Visit: Payer: Medicare Other | Admitting: Internal Medicine

## 2020-10-20 ENCOUNTER — Other Ambulatory Visit: Payer: Self-pay

## 2020-10-20 ENCOUNTER — Encounter: Payer: Self-pay | Admitting: Internal Medicine

## 2020-10-20 VITALS — BP 156/81 | HR 66 | Temp 98.0°F | Ht 70.0 in | Wt 178.4 lb

## 2020-10-20 DIAGNOSIS — B957 Other staphylococcus as the cause of diseases classified elsewhere: Secondary | ICD-10-CM

## 2020-10-20 DIAGNOSIS — M8660 Other chronic osteomyelitis, unspecified site: Secondary | ICD-10-CM | POA: Diagnosis not present

## 2020-10-20 DIAGNOSIS — T847XXA Infection and inflammatory reaction due to other internal orthopedic prosthetic devices, implants and grafts, initial encounter: Secondary | ICD-10-CM

## 2020-10-20 DIAGNOSIS — G062 Extradural and subdural abscess, unspecified: Secondary | ICD-10-CM | POA: Diagnosis not present

## 2020-10-20 DIAGNOSIS — M4624 Osteomyelitis of vertebra, thoracic region: Secondary | ICD-10-CM

## 2020-10-20 DIAGNOSIS — M4325 Fusion of spine, thoracolumbar region: Secondary | ICD-10-CM | POA: Diagnosis not present

## 2020-10-20 DIAGNOSIS — M4620 Osteomyelitis of vertebra, site unspecified: Secondary | ICD-10-CM

## 2020-10-20 DIAGNOSIS — M4635 Infection of intervertebral disc (pyogenic), thoracolumbar region: Secondary | ICD-10-CM | POA: Diagnosis not present

## 2020-10-20 NOTE — Progress Notes (Signed)
Subjective:  Chief complaint: back pain and hardware associated spine infection  [name], male    DOB: March 04, 1946, 75 y.o.   MRN: [id number]  HPI  57 -year-old man with a history of degenerative disc disease and multiple spinal fusions, previously infected with p-acnes, now here for f/u for staph epi hardware associated osteomyelitis   10/20/20 id f/u Patient transitioned from bactrim on 3/19 His picc was placed on 3/18 His pain is much better 2-3/10 now His picc site has no issue No n/v/diarrhea/f/c He has advance home health service and a nurse visits his home for picc care     I saw her for the first time 3/14, and below is background information -------- In 07/2020 ID visit, patient's doxycycline was stopped as he was on several medication and would like to cut back, and crp previously was improving.  He described chronic pain in the back for several years but it has gotten worsen 4-6 weeks prior to the last surgery to the point he couldn't ambulate/move around well  He had a thoracic spine mri 04/2020 "Although endplate destructive change at T4-5 has progressed, the overall appearance of discitis and osteomyelitis at T4-5 is improved. Previously seen epidural abscess has resolved.  Decreased marrow edema in the T6 vertebral body consistent with improved osteomyelitis.  Fusion across the T9-10 interspace where the patient previously had discitis and osteomyelitis."  He ultimately was taken for surgery on 09/14/2020 Patient has been on chronic pain management with opioid He also describes being more sleeping/tired during the past several weeks with the pain; he did increased his hydrocodone 5->7.5 six times a day. Also takes lyrica, zanaflex.  Since surgery patient had backed off of hydrocodone to 5 times a day. He had noticed obvious improvement to 4/10. Before surgery was 8-9/10 even with medication  I reviewed the surgical op note: 09/14/2020  removal of rods/pedicle screws of t5/t6, exploration of fusion, biopsy of transpedicular t5 and t6. 90 minute surgery, no pus seen.   Since the surgery 2/28 he has been on bactrim ds 1 tablet po bid  He wonders why he is here today in the id clinic He is happy with how his back is currently feeling   Previously from 07/2020 dr Lucianne Lei dam's note -----------  He has a rather complex surgical history which in regards to his back began in 2015 Dr. Agapito Games performed an T8-L1 fusion to assist with left leg radicular pain and numbness.  Despite multiple visits to see his surgeon at [facility] postoperatively, the patient states that his left leg numbness and direct low thoracic back pain returned within months after his surgery.  Imaging in 2018 showed that he had an L5-S1 rod breakage and loosening of the pedicle screws at that level.  He sought a second opinion with Dr. Louanne Skye late in 2018.  In November 2018, he had a replacement of the L5-S1 fusion segment and bone grafting was performed simultaneously.  Per the operative note reviewed, the broken rod was left intact at that time.  He reports several partial or near falls following this procedure, but he denies any total falls or direct trauma to his back in the past several years.  As his back pain and left leg radicular symptoms gradually returned, further imaging was performed that suggested screw loosening at the T9-T11 section of his spinal fusion.  On February 12, 2019 he returned to the OR with Dr. Louanne Skye and was found to have screw  loosening at the same levels, so his fusion was extended superiorly to T5 and bone grafting was performed.  Initially, the patient had a good postoperative course, but over weeks 2 and 3 postoperatively, he began developing pain in his back at his operative site, prompting plain films to be obtained.  X-rays showed a loss of fixation of the opposite left internal rod fastener at the site of his severe back pain and the  development of acute loosening of the interval rod fasteners and loss of fixation.  On March 12, 2019, he was taken back to the operating room where it was noted that his Harrington rod had loosened and nearly entirely migrated out of position.  A culture was obtained near the pedicle screw at T10 as a small amount of soft tissue with significant bone osteomalacia was observed.  Given concern for his spinal stability, his recently placed Harrington rods were removed and revised/replaced.  Operative cultures were followed and remained negative at the time of his hospital discharge on March 14, 2019.  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    He was then placed on ceftriaxone in October 2020 with improvement in his symptoms and his inflammatory markers.  He completed his the ceftriaxone and changed over to doxycycline.  He saw Dr. Linus Salmons in February and seemed to be doing relatively well.  Since then he did have some episodes of feeling poorly and feeling flushed and feeling like "something not right I think I am sick".   Dr. 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 Extensive ventral and dorsal epidural thickening and enhancement around the cord from T4 there approximately T6 compatible with epidural infection. And probable osteomyelitis of T6.  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  Dr. Louanne Skye called me arrange for the patient be seen in clinic   On exam he appeared stable and does not have any new neurological deficits.  Dr. Louanne Skye asked radiology to aspirate the disc for culture but they were unable to do so.  We obtained blood cultures and clinic and these were with no out any growth.   We r initiated him on ceftriaxone on April 6.  He was tolerating the  antibiotics foot relatively well though he complains of some dark stools that he thinks might be due to the antibiotics.  He has continued ceftriaxone and then changed over to doxycycline.  MRI of the spine ierformed on Dec 06, 2019 and I have reviewed and agree with the findings when they are official report is showing  T4-5 discitis osteomyelitis. Decreased conspicuity of ventral epidural abscess, epidural phlegmon and spinal canal narrowing at the T4-5 level.  T4-5 cord edema, unchanged.  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  Partially imaged posterior spinal fusion hardware. Please note lack of intravenous contrast and susceptibility artifact limits Evaluation.  Interim he developed profoundl fatigued and apparently had lost weight over the last several months.  He has some dizziness which seems to have preceded him being on doxycycline   We switched him from doxycycline to amoxicillin during that visit  Since then he was actually found to have significant gastric ulcers.  He is been taken off nonsteroidals and has not been drinking alcohol for the last 4 months.  On a PPI and Carafate  He has  been putting back on weight and feeling much better his energy is improving appetite improving.  Still having some low back pain but this was much improved.  He was back on doxycycline instead of amoxicillin.  I saw him in August and we planned to have a repeat MRI which now was done this week.  Repeat MRI in April 23, 2020 shows:  Destructive changes at T4 and T5 that is progressed but overall seem to have improved in periods as far as his discitis and osteomyelitis in this region.  Dural abscess previously seen is completely resolved.  There is decreased marrow edema in the T6 vertebral body.  There is a fusion across the T9-T10 space where he previously had discitis and osteomyelitis.  Been found to have loosening hardware at T5 and T6 which Dr. Louanne Skye does not  think is due to infection.  He would like to remove the loosened hardware which includes rods and screws and apply a local bone graft and allograft.  The patient is anxious to come off antibiotics.  I was initially apprehensive about him coming off antibiotics because of concern that Dr. Louanne Skye might place hardware.  It sounds like that is not the plan but rather removal of hardware as planned.  Patient does continue to have mid back pain which can be severe at times up to 8-10 out of severity but he is able to get relief by changing position.  His peptic ulcer disease is improved with stopping nonsteroidals and he is cut out alcohol lately.  It is difficult for him to distinguish the pain he had from infection versus the pain he has from the hardware that is loose.  At our last visit our plan was to trial him off antibiotics if his inflammatory markers are reassuring, however he appears to have stayed on antibiotics even though inflammatory markers were normal.  Have told him today we will check the labs today but that he should leave and stop antibiotics and that we will call him if we want him to start back on.  Is elective surgery with Dr. Louanne Skye has been delayed due to COVID-19.  I think having him off antibiotics for some period time before that surgery would be helpful and again if there is infection at the surgical screw site then Dr. Louanne Skye be able to get cultures in the operating room without the patient having been on prolonged months and months of antibiotics.   Patient also has recently lost his son who died of metastatic lung cancer that progressed rapidly after he was diagnosed with metastases to the brain.  His son was only 50 years of age.    Past Medical History:  Diagnosis Date  . Acute pharyngitis 10/21/2013  . Allergy    grass and pollen  . Anxiety and depression 10/25/2011  . BPH (benign prostatic hyperplasia) 04/23/2012  . Chicken pox as a child  . DDD (degenerative  disc disease)    cervical responds to steroid injections and low back required surgery  . DDD (degenerative disc disease), lumbosacral   . Diabetes mellitus    pre  . Dyspnea   . ED (erectile dysfunction) 04/23/2012  . Elevated BP   . 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 Esophageal reflux 02/10/2015  . Fatigue   . HTN (hypertension)   . Hyperglycemia    preDM   . Hyperlipidemia   . Insomnia   . Low back pain radiating to both legs 01/16/2017  . Measles as a child  .  Medicare annual wellness visit, subsequent 02/10/2015  . Overweight(278.02)   . Peripheral neuropathy 08/07/2019  . Personal history of colonic polyps 10/27/2012   Follows with [facility]  . Pneumonia    " walking"  . Preventative health care   . 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  . Wears glasses     Past Surgical History:  Procedure Laterality Date  . BACK SURGERY  2012 and 1994   Dr Margret Chance, screws and cage in low back  . BIOPSY  01/09/2020   Procedure: BIOPSY;  Surgeon: Otis Brace, MD;  Location: [facility];  Service: Gastroenterology;;  . COLONOSCOPY WITH PROPOFOL N/A 01/09/2020   Procedure: COLONOSCOPY WITH PROPOFOL;  Surgeon: Otis Brace, MD;  Location: [facility];  Service: Gastroenterology;  Laterality: N/A;  . ESOPHAGOGASTRODUODENOSCOPY (EGD) WITH PROPOFOL N/A 01/09/2020   Procedure: ESOPHAGOGASTRODUODENOSCOPY (EGD) WITH PROPOFOL;  Surgeon: Otis Brace, MD;  Location: [facility];  Service: Gastroenterology;  Laterality: N/A;  . EYE SURGERY Bilateral    2016  . HARDWARE REVISION  03/12/2019   REVISION OF HARDWARE THORACIC TEN-THORACIC ELEVEN WITH APPLICATION OF ADDITIONAL RODS AND ROD SLEEVES THORACIC EIGHT-THORACIC TWELVE 12-LUMBAR ONE (N/A )  . LAMINECTOMY  02/12/2019   DECOMPRESSIVE LAMINECTOMY THORACIC NINE-THORACIC TEN AND THORACIC TEN-THORACIC ELEVEN, EXTENSION OF THORACOLUMBAR FUSION FROM THORACIC TEN TO THORACIC FIVE, LOCAL BONE GRAFT, ALLOGRAFT AND VIVIGEN (N/A)  .  POLYPECTOMY  01/09/2020   Procedure: POLYPECTOMY;  Surgeon: Otis Brace, MD;  Location: [facility];  Service: Gastroenterology;;  . REMOVAL OF RODS AND PEDICLE SCREWS T5 AND T6, EXPLORATION OF FUSION, BIOPSY TRANSPEDICULAR T5 AND T6 (N/A )  09/14/2020  . SPINAL FUSION N/A 02/12/2019   Procedure: DECOMPRESSIVE LAMINECTOMY THORACIC NINE-THORACIC TEN AND THORACIC TEN-THORACIC ELEVEN, EXTENSION OF THORACOLUMBAR FUSION FROM THORACIC TEN TO THORACIC FIVE, LOCAL BONE GRAFT, ALLOGRAFT AND VIVIGEN;  Surgeon: Jessy Oto, MD;  Location: [facility];  Service: Orthopedics;  Laterality: N/A;  DECOMPRESSIVE LAMINECTOMY THORACIC NINE-THORACIC TEN AND THORACIC TEN-THORACIC ELEVEN, EXTENSION OF TH  . SPINAL FUSION N/A 03/12/2019   Procedure: REVISION OF HARDWARE THORACIC TEN-THORACIC ELEVEN WITH APPLICATION OF ADDITIONAL RODS AND ROD SLEEVES THORACIC EIGHT-THORACIC TWELVE 12-LUMBAR ONE;  Surgeon: Jessy Oto, MD;  Location: [place];  Service: Orthopedics;  Laterality: N/A;  . TONSILLECTOMY    . TONSILLECTOMY     as child  . torn rotator cuff  2010   right    Family History  Problem Relation Age of Onset  . Hypertension Mother   . Diabetes Mother        type 2  . Cancer Mother 77       breast in remission  . Emphysema Father        smoker  . COPD Father        smoker  . Stroke Father 73       mini  . Heart disease Father   . Hypertension Sister   . Hyperlipidemia Sister   . Scoliosis Sister   . Osteoporosis Sister   . Hypertension Maternal Grandmother   . Scoliosis Maternal Grandmother   . Heart disease Maternal Grandfather   . Heart disease Paternal Grandfather        smoker  . Heart disease Daughter       Social History   Socioeconomic History  . Marital status: Married    Spouse name: Not on file  . Number of children: Not on file  . Years of education: Not on file  . Highest education level: Not on file  Occupational History  . Not on file  Tobacco Use  . Smoking status:  Former Smoker    Packs/day: 1.00    Years: 20.00    Pack years: 20.00    Types: Cigarettes    Quit date: 07/18/1989    Years since quitting: 31.2  . Smokeless tobacco: Never Used  . Tobacco comment: off and on smoking for the 20 yrs  Vaping Use  . Vaping Use: Never used  Substance and Sexual Activity  . Alcohol use: Not Currently    Comment: drink or two a day- mixed drink  . Drug use: No  . Sexual activity: Yes    Comment: lives with wife, still working, no dietary restrictions, continues to exercise intermittently  Other Topics Concern  . Not on file  Social History Narrative  . Not on file   Social Determinants of Health   Financial Resource Strain: Not on file  Food Insecurity: Not on file  Transportation Needs: Not on file  Physical Activity: Not on file  Stress: Not on file  Social Connections: Not on file    No Known Allergies   Current Outpatient Medications:  .  atorvastatin (LIPITOR) 10 MG tablet, TAKE 1 TABLET BY MOUTH EVERY DAY (Patient taking differently: Take 10 mg by mouth daily.), Disp: 90 tablet, Rfl: 1 .  b complex vitamins capsule, Take 1 capsule by mouth daily., Disp: , Rfl:  .  BAYER ASPIRIN EC LOW DOSE 81 MG EC tablet, Take 81 mg by mouth every morning., Disp: , Rfl:  .  busPIRone (BUSPAR) 7.5 MG tablet, 1-2 TAB BY MOUTH 3 TIMES A DAY FOR ANXIETY (Patient taking differently: Take 7.5 mg by mouth 2 (two) times daily.), Disp: 540 tablet, Rfl: 1 .  cetirizine (ZYRTEC) 10 MG tablet, Take 10 mg by mouth daily. In the morning, Disp: , Rfl:  .  docusate sodium (COLACE) 100 MG capsule, TAKE 1 CAPSULE BY MOUTH TWICE A DAY, Disp: 30 capsule, Rfl: 0 .  famotidine (PEPCID) 40 MG tablet, Take 40 mg by mouth at bedtime., Disp: , Rfl:  .  ferrous fumarate (FERRETTS) 325 (106 Fe) MG TABS tablet, Take 1 tablet (106 mg of iron total) by mouth daily., Disp: 90 tablet, Rfl: 3 .  glucose blood (CONTOUR NEXT TEST) test strip, Check blood sugar once daily, Disp: 100 each,  Rfl: 12 .  HYDROcodone-acetaminophen (NORCO) 7.5-325 MG tablet, Take 1 tablet by mouth every 4 (four) hours as needed for up to 7 days for moderate pain ((score 4 to 6))., Disp: 40 tablet, Rfl: 0 .  JANUVIA 50 MG tablet, TAKE 1 TABLET BY MOUTH EVERY DAY (Patient taking differently: Take 50 mg by mouth daily.), Disp: 30 tablet, Rfl: 3 .  lisinopril (ZESTRIL) 5 MG tablet, TAKE 1 TABLET BY MOUTH TWICE A DAY (Patient taking differently: Take 5 mg by mouth daily.), Disp: 180 tablet, Rfl: 1 .  metFORMIN (GLUCOPHAGE) 1000 MG tablet, TAKE 1 TABLET (1,000 MG TOTAL) BY MOUTH 2 (TWO) TIMES DAILY WITH A MEAL., Disp: 180 tablet, Rfl: 0 .  pantoprazole (PROTONIX) 40 MG tablet, TAKE 1 TABLET BY MOUTH TWICE A DAY BEFORE A MEAL (Patient taking differently: Take 40 mg by mouth 2 (two) times daily before a meal.), Disp: 180 tablet, Rfl: 1 .  pregabalin (LYRICA) 75 MG capsule, TAKE 1 CAPSULE BY MOUTH TWICE A DAY (Patient taking differently: Take 75 mg by mouth 2 (two) times daily.), Disp: 60 capsule, Rfl: 3 .  sucralfate (CARAFATE) 1 g tablet,  Take 1 tablet (1 g total) by mouth 4 (four) times daily -  with meals and at bedtime., Disp: 120 tablet, Rfl: 1 .  sulfamethoxazole-trimethoprim (BACTRIM DS) 800-160 MG tablet, Take 1 tablet by mouth 2 (two) times daily., Disp: 28 tablet, Rfl: 0 .  tamsulosin (FLOMAX) 0.4 MG CAPS capsule, Take 2 capsules (0.8 mg total) by mouth daily., Disp: 180 capsule, Rfl: 3 .  testosterone cypionate (DEPOTESTOSTERONE CYPIONATE) 200 MG/ML injection, INJECT 1 ML (200 MG TOTAL) INTO THE MUSCLE EVERY 14 (FOURTEEN) DAYS., Disp: 2 mL, Rfl: 0 .  tiZANidine (ZANAFLEX) 4 MG tablet, TAKE 1.5 TABLETS (6 MG TOTAL) BY MOUTH EVERY 6 (SIX) HOURS AS NEEDED FOR MUSCLE SPASMS., Disp: 540 tablet, Rfl: 1 .  venlafaxine XR (EFFEXOR-XR) 150 MG 24 hr capsule, TAKE 1 CAPSULE (150 MG TOTAL) BY MOUTH DAILY WITH BREAKFAST., Disp: 90 capsule, Rfl: 1 .  zolpidem (AMBIEN) 5 MG tablet, Take 1 tablet (5 mg total) by mouth at  bedtime as needed for sleep., Disp: 30 tablet, Rfl: 0  Current Facility-Administered Medications:  .  testosterone cypionate (DEPOTESTOSTERONE CYPIONATE) injection 200 mg, 200 mg, Intramuscular, Q14 Days, Saguier, Edward, PA-C, 200 mg at 09/11/20 8182   Review of Systems  All other ros negative     Objective:  General/constitutional: no distress, pleasant HEENT: Normocephalic, PER, Conj Clear, EOMI, Oropharynx clear Neck supple CV: rrr no mrg Lungs: clear to auscultation, normal respiratory effort Abd: Soft, Nontender Ext: no edema Skin: No Rash Neuro: nonfocal MSK: no peripheral joint swelling/tenderness/warmth; back spines nontender; thoracic incision healed, no surrounding swelling/tenderness/erythema   Central line presence: rue picc site no redness/fluctuance/tenderness   Lab: 3/30 cbc 8/13/339; cr 0.7; lft wnl; alb 4.5  Micro: 2/28 surgical cx staph epi (S bactrim; R tetra, oxicillin)   Crp: 3/30       13 (<10 3/14       33 (<8    Imaging:          Assessment & Plan:  #recurrent spine OM associated with hardware.  Abx: 3/19 - c daptomycin 2/28-3/19 bactrim  75 yo male with previous p-acnes vertebral OM with epidural abscess, s/p ceftriaxone 6 weeks x2 with chronic doxycycline suppression for the most part until 07/2020 with doxycycline, but developed worsened back pain associated with partial hardware failure s/p partial hardware adjusmtent and I&D on 09/14/20. There was no purulence found however the surgical culture grew Staph Epi Resistant to methicillin and tetracycline, but sensitive to bactrim  Given no further p-acnes growth, reasonable to treat for staph epi. As bactrim high dose is ultimately toxic to kidney, will initially treat with iv daptomycin and transition to bactrim ds bid for up to 3 months after the initial 6 weeks dapto, as long as he can tolerate. Goal is to get him to bactrim SS bid then daily if possible  10/20/2020  assessement Patient tolerating daptomycin well.  The incision site looks uninfected. His pain is improving, appears to have good I&D and now infection controlled on iv abx We reviewed labs and answered question about duration of abx again The only concern is lack of oral abx except bactrim Again rifampin wasn't used as there was only partial hardware replacement with extensive old hardware remaining    -continue dapto for 6 weeks until 11/15/2020 -on 5/01 will transition back to bactrim ds bid again ideally for 3 months (but will see how long his kidney tolerate), then ss bid for another 3 months then ss daily there after -f/u 4  weeks  I spent more than 20 minute reviewing data/chart, and coordinating care and >50% direct face to face time providing counseling/discussing diagnostics/treatment plan with patient

## 2020-10-20 NOTE — Patient Instructions (Signed)
You are doing well  On 11/15/2020 (when you finish iv daptomycin antibiotics), please start bactrim (sulfa antibiotics) twice a day like before  See me around that time (4 weeks from now), so we can discuss plan for oral antibiotics. It is likely you'll need indefinite oral antibiotics

## 2020-10-21 DIAGNOSIS — M4325 Fusion of spine, thoracolumbar region: Secondary | ICD-10-CM | POA: Diagnosis not present

## 2020-10-21 DIAGNOSIS — G062 Extradural and subdural abscess, unspecified: Secondary | ICD-10-CM | POA: Diagnosis not present

## 2020-10-21 DIAGNOSIS — M4635 Infection of intervertebral disc (pyogenic), thoracolumbar region: Secondary | ICD-10-CM | POA: Diagnosis not present

## 2020-10-22 DIAGNOSIS — M4325 Fusion of spine, thoracolumbar region: Secondary | ICD-10-CM | POA: Diagnosis not present

## 2020-10-22 DIAGNOSIS — M4635 Infection of intervertebral disc (pyogenic), thoracolumbar region: Secondary | ICD-10-CM | POA: Diagnosis not present

## 2020-10-22 DIAGNOSIS — G062 Extradural and subdural abscess, unspecified: Secondary | ICD-10-CM | POA: Diagnosis not present

## 2020-10-23 ENCOUNTER — Other Ambulatory Visit: Payer: Self-pay

## 2020-10-23 ENCOUNTER — Ambulatory Visit (INDEPENDENT_AMBULATORY_CARE_PROVIDER_SITE_OTHER): Payer: Medicare Other | Admitting: Family Medicine

## 2020-10-23 ENCOUNTER — Encounter: Payer: Self-pay | Admitting: Family Medicine

## 2020-10-23 ENCOUNTER — Telehealth: Payer: Self-pay | Admitting: Internal Medicine

## 2020-10-23 VITALS — BP 158/90 | HR 87 | Temp 98.1°F | Ht 70.0 in | Wt 175.1 lb

## 2020-10-23 DIAGNOSIS — M4325 Fusion of spine, thoracolumbar region: Secondary | ICD-10-CM | POA: Diagnosis not present

## 2020-10-23 DIAGNOSIS — M4635 Infection of intervertebral disc (pyogenic), thoracolumbar region: Secondary | ICD-10-CM | POA: Diagnosis not present

## 2020-10-23 DIAGNOSIS — G062 Extradural and subdural abscess, unspecified: Secondary | ICD-10-CM | POA: Diagnosis not present

## 2020-10-23 DIAGNOSIS — I10 Essential (primary) hypertension: Secondary | ICD-10-CM | POA: Diagnosis not present

## 2020-10-23 MED ORDER — OLMESARTAN MEDOXOMIL 5 MG PO TABS: 5.0000 mg | ORAL_TABLET | Freq: Every day | ORAL | 2 refills | Status: DC

## 2020-10-23 NOTE — Progress Notes (Signed)
Chief Complaint  Patient presents with  . Follow-up    Dark Stools hypotension    Subjective Luis Brown is a 75 y.o. male who presents for hypertension follow up. Dropping lower for the past 1.5 months.  He does monitor home blood pressures. Blood pressures ranging from 66-150's/44-100's on average. He is compliant with medication- lisinopril 5 mg bid.  His blood pressure becomes elevated when he does not take his medicine routinely and it becomes quite low on the above range when he does take it. Patient has these side effects of medication: none He is adhering to a healthy diet overall. Current exercise: active around house/yard No CP, SOB, abd pain.  The patient has been having a combination of constipation and dark stools, not quite tarry colored, over the past 2 months.  He does take iron supplementation.  Less than a month ago, he had blood work showing increasing hemoglobin levels.  He has no new dizziness or decreased energy other than when his blood pressure drops.   Past Medical History:  Diagnosis Date  . Acute pharyngitis 10/21/2013  . Allergy    grass and pollen  . Anxiety and depression 10/25/2011  . BPH (benign prostatic hyperplasia) 04/23/2012  . Chicken pox as a child  . DDD (degenerative disc disease)    cervical responds to steroid injections and low back required surgery  . DDD (degenerative disc disease), lumbosacral   . Diabetes mellitus    pre  . Dyspnea   . ED (erectile dysfunction) 04/23/2012  . Elevated BP   . 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 Esophageal reflux 02/10/2015  . Fatigue   . HTN (hypertension)   . Hyperglycemia    preDM   . Hyperlipidemia   . Insomnia   . Low back pain radiating to both legs 01/16/2017  . Measles as a child  . Medicare annual wellness visit, subsequent 02/10/2015  . Overweight(278.02)   . Peripheral neuropathy 08/07/2019  . Personal history of colonic polyps 10/27/2012   Follows with [facility]  . Pneumonia     " walking"  . Preventative health care   . 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  . Wears glasses     Exam BP (!) 158/90 (BP Location: Left Arm, Patient Position: Sitting, Cuff Size: Normal)   Pulse 87   Temp 98.1 F (36.7 C) (Oral)   Ht 5\' 10"  (1.778 m)   Wt 175 lb 2 oz (79.4 kg)   SpO2 97%   BMI 25.13 kg/m  General:  well developed, well nourished, in no apparent distress Heart: RRR, no bruits, no LE edema Lungs: clear to auscultation, no accessory muscle use Psych: well oriented with normal range of affect and appropriate judgment/insight  Essential hypertension - Plan: olmesartan (BENICAR) 5 MG tablet  Status: Chronic, uncontrolled.  Start a very low-dose of Benicar as it does have a longer half-life so hopefully he will not have these dips or peaks.  Continue to check blood pressure at home.  Counseled on diet and exercise.  I would like him to follow-up with his regular PCP early next week. The patient voiced understanding and agreement to the plan.  [place], [place] 10/23/20  4:42 PM'

## 2020-10-23 NOTE — Patient Instructions (Signed)
Keep checking your blood pressure at home.  Stop the lisinopril.   Let me know if there are cost issues with the medicine.   I think the dark stools and constipation are side effects from the iron you are taking.   Keep the diet clean and stay active.  Let us know if you need anything.

## 2020-10-23 NOTE — Telephone Encounter (Signed)
opat labs   4/05 cbc 9/12/286; cr 0.7; lft wnl; ck 290    Crp: 4/05        14 (<10) 3/29        13 3/22        14

## 2020-10-24 DIAGNOSIS — M4635 Infection of intervertebral disc (pyogenic), thoracolumbar region: Secondary | ICD-10-CM | POA: Diagnosis not present

## 2020-10-24 DIAGNOSIS — M4325 Fusion of spine, thoracolumbar region: Secondary | ICD-10-CM | POA: Diagnosis not present

## 2020-10-24 DIAGNOSIS — G062 Extradural and subdural abscess, unspecified: Secondary | ICD-10-CM | POA: Diagnosis not present

## 2020-10-25 ENCOUNTER — Encounter: Payer: Self-pay | Admitting: Medical

## 2020-10-25 ENCOUNTER — Other Ambulatory Visit: Payer: Self-pay | Admitting: Medical

## 2020-10-25 DIAGNOSIS — M4325 Fusion of spine, thoracolumbar region: Secondary | ICD-10-CM | POA: Diagnosis not present

## 2020-10-25 DIAGNOSIS — M4635 Infection of intervertebral disc (pyogenic), thoracolumbar region: Secondary | ICD-10-CM | POA: Diagnosis not present

## 2020-10-25 DIAGNOSIS — G062 Extradural and subdural abscess, unspecified: Secondary | ICD-10-CM | POA: Diagnosis not present

## 2020-10-26 DIAGNOSIS — G062 Extradural and subdural abscess, unspecified: Secondary | ICD-10-CM | POA: Diagnosis not present

## 2020-10-26 DIAGNOSIS — M961 Postlaminectomy syndrome, not elsewhere classified: Secondary | ICD-10-CM | POA: Diagnosis not present

## 2020-10-26 DIAGNOSIS — M4635 Infection of intervertebral disc (pyogenic), thoracolumbar region: Secondary | ICD-10-CM | POA: Diagnosis not present

## 2020-10-26 DIAGNOSIS — M4325 Fusion of spine, thoracolumbar region: Secondary | ICD-10-CM | POA: Diagnosis not present

## 2020-10-26 DIAGNOSIS — M150 Primary generalized (osteo)arthritis: Secondary | ICD-10-CM | POA: Diagnosis not present

## 2020-10-26 DIAGNOSIS — G894 Chronic pain syndrome: Secondary | ICD-10-CM | POA: Diagnosis not present

## 2020-10-26 DIAGNOSIS — M4628 Osteomyelitis of vertebra, sacral and sacrococcygeal region: Secondary | ICD-10-CM | POA: Diagnosis not present

## 2020-10-26 MED ORDER — METFORMIN HCL 1000 MG PO TABS: 1000.0000 mg | ORAL_TABLET | Freq: Two times a day (BID) | ORAL | 0 refills | Status: DC

## 2020-10-27 ENCOUNTER — Other Ambulatory Visit: Payer: Self-pay | Admitting: Medical

## 2020-10-27 DIAGNOSIS — G062 Extradural and subdural abscess, unspecified: Secondary | ICD-10-CM | POA: Diagnosis not present

## 2020-10-27 DIAGNOSIS — M4325 Fusion of spine, thoracolumbar region: Secondary | ICD-10-CM | POA: Diagnosis not present

## 2020-10-27 DIAGNOSIS — M8660 Other chronic osteomyelitis, unspecified site: Secondary | ICD-10-CM | POA: Diagnosis not present

## 2020-10-27 DIAGNOSIS — M4635 Infection of intervertebral disc (pyogenic), thoracolumbar region: Secondary | ICD-10-CM | POA: Diagnosis not present

## 2020-10-28 ENCOUNTER — Encounter: Payer: Self-pay | Admitting: Medical

## 2020-10-28 DIAGNOSIS — M4325 Fusion of spine, thoracolumbar region: Secondary | ICD-10-CM | POA: Diagnosis not present

## 2020-10-28 DIAGNOSIS — G062 Extradural and subdural abscess, unspecified: Secondary | ICD-10-CM | POA: Diagnosis not present

## 2020-10-28 DIAGNOSIS — M4635 Infection of intervertebral disc (pyogenic), thoracolumbar region: Secondary | ICD-10-CM | POA: Diagnosis not present

## 2020-10-28 MED ORDER — FERRETTS 325 (106 FE) MG PO TABS: 1.0000 | ORAL_TABLET | Freq: Every day | ORAL | 3 refills | Status: DC

## 2020-10-29 DIAGNOSIS — M4325 Fusion of spine, thoracolumbar region: Secondary | ICD-10-CM | POA: Diagnosis not present

## 2020-10-29 DIAGNOSIS — G062 Extradural and subdural abscess, unspecified: Secondary | ICD-10-CM | POA: Diagnosis not present

## 2020-10-29 DIAGNOSIS — M4635 Infection of intervertebral disc (pyogenic), thoracolumbar region: Secondary | ICD-10-CM | POA: Diagnosis not present

## 2020-10-30 DIAGNOSIS — G062 Extradural and subdural abscess, unspecified: Secondary | ICD-10-CM | POA: Diagnosis not present

## 2020-10-30 DIAGNOSIS — M4635 Infection of intervertebral disc (pyogenic), thoracolumbar region: Secondary | ICD-10-CM | POA: Diagnosis not present

## 2020-10-30 DIAGNOSIS — M4325 Fusion of spine, thoracolumbar region: Secondary | ICD-10-CM | POA: Diagnosis not present

## 2020-10-31 DIAGNOSIS — G062 Extradural and subdural abscess, unspecified: Secondary | ICD-10-CM | POA: Diagnosis not present

## 2020-10-31 DIAGNOSIS — M4635 Infection of intervertebral disc (pyogenic), thoracolumbar region: Secondary | ICD-10-CM | POA: Diagnosis not present

## 2020-10-31 DIAGNOSIS — M4325 Fusion of spine, thoracolumbar region: Secondary | ICD-10-CM | POA: Diagnosis not present

## 2020-11-01 DIAGNOSIS — M4325 Fusion of spine, thoracolumbar region: Secondary | ICD-10-CM | POA: Diagnosis not present

## 2020-11-01 DIAGNOSIS — G062 Extradural and subdural abscess, unspecified: Secondary | ICD-10-CM | POA: Diagnosis not present

## 2020-11-01 DIAGNOSIS — M4635 Infection of intervertebral disc (pyogenic), thoracolumbar region: Secondary | ICD-10-CM | POA: Diagnosis not present

## 2020-11-02 ENCOUNTER — Encounter: Payer: Self-pay | Admitting: Medical

## 2020-11-02 ENCOUNTER — Ambulatory Visit (INDEPENDENT_AMBULATORY_CARE_PROVIDER_SITE_OTHER): Payer: Medicare Other | Admitting: Medical

## 2020-11-02 ENCOUNTER — Other Ambulatory Visit: Payer: Self-pay

## 2020-11-02 VITALS — BP 110/68 | HR 93 | Resp 20 | Ht 70.0 in | Wt 174.0 lb

## 2020-11-02 DIAGNOSIS — M4635 Infection of intervertebral disc (pyogenic), thoracolumbar region: Secondary | ICD-10-CM | POA: Diagnosis not present

## 2020-11-02 DIAGNOSIS — R5383 Other fatigue: Secondary | ICD-10-CM

## 2020-11-02 DIAGNOSIS — D649 Anemia, unspecified: Secondary | ICD-10-CM | POA: Diagnosis not present

## 2020-11-02 DIAGNOSIS — J301 Allergic rhinitis due to pollen: Secondary | ICD-10-CM

## 2020-11-02 DIAGNOSIS — R7989 Other specified abnormal findings of blood chemistry: Secondary | ICD-10-CM | POA: Diagnosis not present

## 2020-11-02 DIAGNOSIS — I10 Essential (primary) hypertension: Secondary | ICD-10-CM

## 2020-11-02 DIAGNOSIS — M4325 Fusion of spine, thoracolumbar region: Secondary | ICD-10-CM | POA: Diagnosis not present

## 2020-11-02 DIAGNOSIS — E119 Type 2 diabetes mellitus without complications: Secondary | ICD-10-CM | POA: Diagnosis not present

## 2020-11-02 DIAGNOSIS — G062 Extradural and subdural abscess, unspecified: Secondary | ICD-10-CM | POA: Diagnosis not present

## 2020-11-02 MED ORDER — ALBUTEROL SULFATE HFA 108 (90 BASE) MCG/ACT IN AERS: 2.0000 | INHALATION_SPRAY | Freq: Four times a day (QID) | RESPIRATORY_TRACT | 0 refills | Status: DC | PRN

## 2020-11-02 MED ORDER — TESTOSTERONE CYPIONATE 200 MG/ML IM SOLN
200.0000 mg | Freq: Once | INTRAMUSCULAR | Status: AC
  Administered 2020-11-02: 200 mg via INTRAMUSCULAR

## 2020-11-02 NOTE — Progress Notes (Signed)
Subjective:    [name], male    DOB: 1946-04-30, 75 y.o.   MRN: [id number]  HPI  Pt bp today 136/64 when we checked. Pt checked this morning 125/78.   However one reading 85/52 and another time 66/44. He states he was not on benicar within 24 hous of using.    Pt saw Dr. Nani Brown.. Based on some high bp he was started on benicar 5 mg daily. On day saw Dr. Rosita Brown bp was 158/90. Pt no longer on lisinopril.  Pt states he only took benicar twice since seeing Dr. Rosita Brown and both days bp was low.   Pt bp is upper arm cuff.  Pt did not take any benicar today.   Pt has some fatigue. Pt has hx of anemia in the past.   Hx of allergic rhinitis and in spring sometimes he will wheeze. Pt can feel some pnd. Has mild dry cough.   Pt has hx of diabetes. 1 month ago his a1c was 6.3. Pt on metformin 1000 mg twice a day.Marland Kitchen   Hx of low T. He got testosterone injection today.   Hx of spinal infection.(Dr Luis Brown recommended below) continue dapto for 6 weeks until 11/15/2020 -on 5/01 will transition back to bactrim ds bid again ideally for 3 months (but will see how long his kidney tolerate), then ss bid for another 3 months then ss daily there after -f/u 4 weeks  For back pain using zanaflex 6mg  every 6 hours per pain management and has norco 7.5 mg 5 times.a day. Pt not drinking any alcohol.   Allergies- he is on zyrtrec and flonase.  Review of Systems  Constitutional: Positive for fatigue. Negative for chills and fever.       See hpi. No curreent symptoms.  HENT: Positive for congestion. Negative for facial swelling.   Respiratory: Negative for cough, chest tightness, shortness of breath and wheezing.        See hpi.  Cardiovascular: Negative for chest pain and palpitations.  Gastrointestinal: Negative for abdominal pain, constipation, nausea and vomiting.  Genitourinary: Negative for dysuria, flank pain, frequency and hematuria.  Musculoskeletal: Positive for back pain.  Negative for myalgias and neck pain.  Skin: Negative for rash.  Neurological: Positive for dizziness. Negative for headaches.  Hematological: Negative for adenopathy. Does not bruise/bleed easily.  Psychiatric/Behavioral: Negative for behavioral problems, confusion and hallucinations.       Objective:   Physical Exam  General Mental Status- Alert. General Appearance- Not in acute distress.   Skin General: Color- Normal Color. Moisture- Normal Moisture.  Neck Carotid Arteries- Normal color. Moisture- Normal Moisture. No carotid bruits. No JVD.  Chest and Lung Exam Auscultation: Breath Sounds:-Normal.  Cardiovascular Auscultation:Rythm- Regular. Murmurs & Other Heart Sounds:Auscultation of the heart reveals- No Murmurs.  Abdomen Inspection:-Inspeection Normal. Palpation/Percussion:Note:No mass. Palpation and Percussion of the abdomen reveal- Non Tender, Non Distended + BS, no rebound or guarding.    Neurologic Cranial Nerve exam:- CN III-XII intact(No nystagmus), symmetric smile. Strength:- 5/5 equal and symmetric strength both upper and lower extremities.      Assessment & Plan:  History of hypertension but some random intermittent low blood pressure readings.  Today  blood pressure was 110/70 range and you have not taking any Benicar.  Recommended to stop Benicar presently.  Check blood pressure 3 times a day.  Remember you can do consecutive readings 5 minutes apart as discussed.  Also remember that sometimes blood pressure drop can occur after position  change.  In that event daily consecutive readings as well.  Send me blood pressure reading in 4 to 5 days via MyChart.  If you are still getting wide-ranging blood pressure readings then consider referring to cardiologist for 24hour blood pressure monitoring.  For fatigue will get CMP, CBC and iron level.  Make sure not anemic as you do have a history of anemia.  History of low T and testosterone injection  today.  History of diabetes.  Continue metformin and Januvia.  Check blood pressure 1-2 times daily.  Recent allergic rhinitis.  Continue over-the-counter antihistamine and Flonase.  Refilled your albuterol as you report sometimes wheezing/shortness of breath with allergy flare.  Follow-up in 7 to 10 days or as needed.  Time spent with patient today was  46 minutes which consisted of chart review, discussing diagnosis, work up, treatment and documentation.

## 2020-11-02 NOTE — Patient Instructions (Signed)
History of hypertension but some random intermittent low blood pressure readings.  Today  blood pressure was 110/70 range and you have not taking any Benicar.  Recommended to stop Benicar presently.  Check blood pressure 3 times a day.  Remember you can do consecutive readings 5 minutes apart as discussed.  Also remember that sometimes blood pressure drop can occur after position change.  In that event daily consecutive readings as well.  Send me blood pressure reading in 4 to 5 days via MyChart.  If you are still getting wide-ranging blood pressure readings then consider referring to cardiologist for 24hour blood pressure monitoring.  For fatigue will get CMP, CBC and iron level.  Make sure not anemic as you do have a history of anemia.  History of low T and testosterone injection today.  History of diabetes.  Continue metformin and Januvia.  Check blood pressure 1-2 times daily.  Recent allergic rhinitis.  Continue over-the-counter antihistamine and Flonase.  Refilled your albuterol as you report sometimes wheezing/shortness of breath with allergy flare.  Follow-up in 7 to 10 days or as needed.

## 2020-11-03 ENCOUNTER — Ambulatory Visit: Payer: Medicare Other

## 2020-11-03 ENCOUNTER — Encounter: Payer: Self-pay | Admitting: Medical

## 2020-11-03 ENCOUNTER — Telehealth: Payer: Self-pay | Admitting: Medical

## 2020-11-03 DIAGNOSIS — E875 Hyperkalemia: Secondary | ICD-10-CM

## 2020-11-03 DIAGNOSIS — M4635 Infection of intervertebral disc (pyogenic), thoracolumbar region: Secondary | ICD-10-CM | POA: Diagnosis not present

## 2020-11-03 DIAGNOSIS — G062 Extradural and subdural abscess, unspecified: Secondary | ICD-10-CM | POA: Diagnosis not present

## 2020-11-03 DIAGNOSIS — M4630 Infection of intervertebral disc (pyogenic), site unspecified: Secondary | ICD-10-CM | POA: Diagnosis not present

## 2020-11-03 DIAGNOSIS — Q7649 Other congenital malformations of spine, not associated with scoliosis: Secondary | ICD-10-CM | POA: Diagnosis not present

## 2020-11-03 DIAGNOSIS — M4325 Fusion of spine, thoracolumbar region: Secondary | ICD-10-CM | POA: Diagnosis not present

## 2020-11-03 LAB — COMPREHENSIVE METABOLIC PANEL
ALT: 8 U/L (ref 0–53)
AST: 14 U/L (ref 0–37)
Albumin: 3.8 g/dL (ref 3.5–5.2)
Alkaline Phosphatase: 95 U/L (ref 39–117)
BUN: 14 mg/dL (ref 6–23)
CO2: 31 mEq/L (ref 19–32)
Calcium: 9.3 mg/dL (ref 8.4–10.5)
Chloride: 99 mEq/L (ref 96–112)
Creatinine, Ser: 0.7 mg/dL (ref 0.40–1.50)
GFR: 89.07 mL/min (ref >60.00)
Glucose, Bld: 121 mg/dL — ABNORMAL HIGH (ref 70–99)
Potassium: 5.4 mEq/L — ABNORMAL HIGH (ref 3.5–5.1)
Sodium: 137 mEq/L (ref 135–145)
Total Bilirubin: 0.6 mg/dL (ref 0.2–1.2)
Total Protein: 6.1 g/dL (ref 6.0–8.3)

## 2020-11-03 LAB — CBC WITH DIFFERENTIAL/PLATELET
Basophils Absolute: 0.1 10*3/uL (ref 0.0–0.1)
Basophils Relative: 0.9 % (ref 0.0–3.0)
Eosinophils Absolute: 0.3 10*3/uL (ref 0.0–0.7)
Eosinophils Relative: 3.9 % (ref 0.0–5.0)
HCT: 37.2 % — ABNORMAL LOW (ref 39.0–52.0)
Hemoglobin: 12.5 g/dL — ABNORMAL LOW (ref 13.0–17.0)
Lymphocytes Relative: 5.8 % — ABNORMAL LOW (ref 12.0–46.0)
Lymphs Abs: 0.5 10*3/uL — ABNORMAL LOW (ref 0.7–4.0)
MCHC: 33.6 g/dL (ref 30.0–36.0)
MCV: 92.4 fl (ref 78.0–100.0)
Monocytes Absolute: 0.9 10*3/uL (ref 0.1–1.0)
Monocytes Relative: 9.9 % (ref 3.0–12.0)
Neutro Abs: 6.9 10*3/uL (ref 1.4–7.7)
Neutrophils Relative %: 79.5 % — ABNORMAL HIGH (ref 43.0–77.0)
Platelets: 453 10*3/uL — ABNORMAL HIGH (ref 150.0–400.0)
RBC: 4.03 Mil/uL — ABNORMAL LOW (ref 4.22–5.81)
RDW: 14.5 % (ref 11.5–15.5)
WBC: 8.6 10*3/uL (ref 4.0–10.5)

## 2020-11-03 LAB — IRON: Iron: 24 ug/dL — ABNORMAL LOW (ref 42–165)

## 2020-11-03 MED ORDER — SODIUM POLYSTYRENE SULFONATE 15 GM/60ML PO SUSP: ORAL | 0 refills | Status: DC

## 2020-11-03 NOTE — Telephone Encounter (Signed)
Future cmp placed. 

## 2020-11-04 ENCOUNTER — Ambulatory Visit (INDEPENDENT_AMBULATORY_CARE_PROVIDER_SITE_OTHER): Payer: Medicare Other | Admitting: Surgery

## 2020-11-04 ENCOUNTER — Other Ambulatory Visit: Payer: Self-pay

## 2020-11-04 ENCOUNTER — Encounter: Payer: Self-pay | Admitting: Surgery

## 2020-11-04 ENCOUNTER — Ambulatory Visit: Payer: Self-pay

## 2020-11-04 VITALS — BP 141/78 | HR 88 | Ht 70.0 in | Wt 174.0 lb

## 2020-11-04 DIAGNOSIS — M546 Pain in thoracic spine: Secondary | ICD-10-CM

## 2020-11-04 DIAGNOSIS — M4635 Infection of intervertebral disc (pyogenic), thoracolumbar region: Secondary | ICD-10-CM | POA: Diagnosis not present

## 2020-11-04 DIAGNOSIS — G062 Extradural and subdural abscess, unspecified: Secondary | ICD-10-CM | POA: Diagnosis not present

## 2020-11-04 DIAGNOSIS — M4325 Fusion of spine, thoracolumbar region: Secondary | ICD-10-CM | POA: Diagnosis not present

## 2020-11-04 MED ORDER — TIZANIDINE HCL 4 MG PO TABS: 6.0000 mg | ORAL_TABLET | Freq: Four times a day (QID) | ORAL | 1 refills | Status: DC | PRN

## 2020-11-04 NOTE — Progress Notes (Signed)
Office Visit Note   Patient: Luis Brown           Date of Birth: June 27, 1946           MRN: [id number] Visit Date: 11/04/2020              Requested by: Mackie Pai, PA-C [employer] [place],  [place] [address] PCP: Mackie Pai, PA-C   Assessment & Plan: Visit Diagnoses:  1. Pain in thoracic spine     Plan: I will have patient follow-up with Dr. Louanne Skye in 4 weeks for recheck.  Continue IV antibiotics per ID.  All questions answered.  Refill tizanidine prescription  Follow-Up Instructions: Return in about 4 weeks (around 12/02/2020) for WITH DR [place]. .   Orders:  Orders Placed This Encounter  Procedures  . XR Thoracic Spine 2 View   No orders of the defined types were placed in this encounter.     Procedures: No procedures performed   Clinical Data: No additional findings.   Subjective: Chief Complaint  Patient presents with  . Thoracic spine    HPI 75 year old white male returns.  He is status post thoracic hardware removal.  He still has PICC line per infectious disease.  States that his preop back pain improved but still remains.  Objective: Vital Signs: BP (!) 141/78   Pulse 88   Ht 5\' 10"  (1.778 m)   Wt 174 lb (78.9 kg)   BMI 24.97 kg/m   Physical Exam Pleasant white male alert and oriented in no acute distress.  Gait is normal.  Surgical incision is well-healed.  Mild thoracic paraspinal tenderness. Ortho Exam  Specialty Comments:  No specialty comments available.  Imaging: No results found.   PMFS History: Patient Active Problem List   Diagnosis Date Noted  . History of removal of retained hardware 09/14/2020  . Hyperglycemia 03/22/2020  . Gastric ulcer 01/27/2020  . H/O colonoscopy with polypectomy 01/27/2020  . Protein-calorie malnutrition, severe 01/09/2020  . Weight loss 01/07/2020  . Abdominal pain 12/30/2019  . Nausea and vomiting 12/30/2019  . Weight loss, abnormal 12/30/2019  .  Change in bowel habits 12/30/2019  . 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 Medication monitoring encounter 08/26/2019  . Peripheral neuropathy 08/07/2019  . Alcohol abuse 05/09/2019  . Hyponatremia 05/05/2019  . 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 Fusion of spine of thoracolumbar region 03/12/2019  . Status post thoracic spinal fusion 02/12/2019  . 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 Thoracic spinal stenosis 04/17/2017  . Painful orthopaedic hardware ([place]) 04/17/2017  . 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 ([place]) 04/17/2017  . Low back pain radiating to both legs 01/16/2017  . Esophageal reflux 02/10/2015  . Medicare annual wellness visit, subsequent 02/10/2015  . Personal history of colonic polyps 10/27/2012  . 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  . BPH (benign prostatic hyperplasia) 04/23/2012  . ED (erectile dysfunction) 04/23/2012  . Allergic state 10/25/2011  . Anxiety and depression 10/25/2011  . Asthma   . Diabetes mellitus without complication ([place])   . Hyperlipidemia   . Overweight   . Insomnia   . Fatigue   . Preventative health care   . DDD (degenerative disc disease), lumbosacral   . Thoracic degenerative disc disease   . HTN (hypertension)    Past Medical History:  Diagnosis Date  . Acute pharyngitis 10/21/2013  . Allergy  grass and pollen  . Anxiety and depression 10/25/2011  . BPH (benign prostatic hyperplasia) 04/23/2012  . Chicken pox as a child  . DDD (degenerative disc disease)    cervical responds to steroid injections and low back required surgery  . DDD (degenerative disc disease), lumbosacral   . Diabetes mellitus    pre  . Dyspnea   . ED (erectile dysfunction) 04/23/2012  . Elevated BP   . 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 Esophageal reflux 02/10/2015  . Fatigue   . HTN (hypertension)   . Hyperglycemia    preDM   . Hyperlipidemia   .  Insomnia   . Low back pain radiating to both legs 01/16/2017  . Measles as a child  . Medicare annual wellness visit, subsequent 02/10/2015  . Overweight(278.02)   . Peripheral neuropathy 08/07/2019  . Personal history of colonic polyps 10/27/2012   Follows with [facility]  . Pneumonia    " walking"  . Preventative health care   . 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  . Wears glasses     Family History  Problem Relation Age of Onset  . Hypertension Mother   . Diabetes Mother        type 2  . Cancer Mother 48       breast in remission  . Emphysema Father        smoker  . COPD Father        smoker  . Stroke Father 63       mini  . Heart disease Father   . Hypertension Sister   . Hyperlipidemia Sister   . Scoliosis Sister   . Osteoporosis Sister   . Hypertension Maternal Grandmother   . Scoliosis Maternal Grandmother   . Heart disease Maternal Grandfather   . Heart disease Paternal Grandfather        smoker  . Heart disease Daughter     Past Surgical History:  Procedure Laterality Date  . BACK SURGERY  2012 and 1994   Dr Margret Chance, screws and cage in low back  . BIOPSY  01/09/2020   Procedure: BIOPSY;  Surgeon: Otis Brace, MD;  Location: [facility];  Service: Gastroenterology;;  . COLONOSCOPY WITH PROPOFOL N/A 01/09/2020   Procedure: COLONOSCOPY WITH PROPOFOL;  Surgeon: Otis Brace, MD;  Location: [facility];  Service: Gastroenterology;  Laterality: N/A;  . ESOPHAGOGASTRODUODENOSCOPY (EGD) WITH PROPOFOL N/A 01/09/2020   Procedure: ESOPHAGOGASTRODUODENOSCOPY (EGD) WITH PROPOFOL;  Surgeon: Otis Brace, MD;  Location: [facility];  Service: Gastroenterology;  Laterality: N/A;  . EYE SURGERY Bilateral    2016  . HARDWARE REVISION  03/12/2019   REVISION OF HARDWARE THORACIC TEN-THORACIC ELEVEN WITH APPLICATION OF ADDITIONAL RODS AND ROD SLEEVES THORACIC EIGHT-THORACIC TWELVE 12-LUMBAR ONE (N/A )  . LAMINECTOMY  02/12/2019   DECOMPRESSIVE LAMINECTOMY  THORACIC NINE-THORACIC TEN AND THORACIC TEN-THORACIC ELEVEN, EXTENSION OF THORACOLUMBAR FUSION FROM THORACIC TEN TO THORACIC FIVE, LOCAL BONE GRAFT, ALLOGRAFT AND VIVIGEN (N/A)  . POLYPECTOMY  01/09/2020   Procedure: POLYPECTOMY;  Surgeon: Otis Brace, MD;  Location: [facility];  Service: Gastroenterology;;  . REMOVAL OF RODS AND PEDICLE SCREWS T5 AND T6, EXPLORATION OF FUSION, BIOPSY TRANSPEDICULAR T5 AND T6 (N/A )  09/14/2020  . SPINAL FUSION N/A 02/12/2019   Procedure: DECOMPRESSIVE LAMINECTOMY THORACIC NINE-THORACIC TEN AND THORACIC TEN-THORACIC ELEVEN, EXTENSION OF THORACOLUMBAR FUSION FROM THORACIC TEN TO THORACIC FIVE, LOCAL BONE GRAFT, ALLOGRAFT AND VIVIGEN;  Surgeon: Jessy Oto, MD;  Location: [facility];  Service: Orthopedics;  Laterality: N/A;  DECOMPRESSIVE LAMINECTOMY  THORACIC NINE-THORACIC TEN AND THORACIC TEN-THORACIC ELEVEN, EXTENSION OF TH  . SPINAL FUSION N/A 03/12/2019   Procedure: REVISION OF HARDWARE THORACIC TEN-THORACIC ELEVEN WITH APPLICATION OF ADDITIONAL RODS AND ROD SLEEVES THORACIC EIGHT-THORACIC TWELVE 12-LUMBAR ONE;  Surgeon: Jessy Oto, MD;  Location: [facility];  Service: Orthopedics;  Laterality: N/A;  . TONSILLECTOMY    . TONSILLECTOMY     as child  . torn rotator cuff  2010   right   Social History   Occupational History  . Not on file  Tobacco Use  . Smoking status: Former Smoker    Packs/day: 1.00    Years: 20.00    Pack years: 20.00    Types: Cigarettes    Quit date: 07/18/1989    Years since quitting: 31.3  . Smokeless tobacco: Never Used  . Tobacco comment: off and on smoking for the 20 yrs  Vaping Use  . Vaping Use: Never used  Substance and Sexual Activity  . Alcohol use: Not Currently    Comment: drink or two a day- mixed drink  . Drug use: No  . Sexual activity: Yes    Comment: lives with wife, still working, no dietary restrictions, continues to exercise intermittently

## 2020-11-05 DIAGNOSIS — M4635 Infection of intervertebral disc (pyogenic), thoracolumbar region: Secondary | ICD-10-CM | POA: Diagnosis not present

## 2020-11-05 DIAGNOSIS — G062 Extradural and subdural abscess, unspecified: Secondary | ICD-10-CM | POA: Diagnosis not present

## 2020-11-05 DIAGNOSIS — M4325 Fusion of spine, thoracolumbar region: Secondary | ICD-10-CM | POA: Diagnosis not present

## 2020-11-06 DIAGNOSIS — M4635 Infection of intervertebral disc (pyogenic), thoracolumbar region: Secondary | ICD-10-CM | POA: Diagnosis not present

## 2020-11-06 DIAGNOSIS — G062 Extradural and subdural abscess, unspecified: Secondary | ICD-10-CM | POA: Diagnosis not present

## 2020-11-06 DIAGNOSIS — M4325 Fusion of spine, thoracolumbar region: Secondary | ICD-10-CM | POA: Diagnosis not present

## 2020-11-07 ENCOUNTER — Encounter: Payer: Self-pay | Admitting: Medical

## 2020-11-07 DIAGNOSIS — M4325 Fusion of spine, thoracolumbar region: Secondary | ICD-10-CM | POA: Diagnosis not present

## 2020-11-07 DIAGNOSIS — M4635 Infection of intervertebral disc (pyogenic), thoracolumbar region: Secondary | ICD-10-CM | POA: Diagnosis not present

## 2020-11-07 DIAGNOSIS — G062 Extradural and subdural abscess, unspecified: Secondary | ICD-10-CM | POA: Diagnosis not present

## 2020-11-08 ENCOUNTER — Encounter: Payer: Self-pay | Admitting: Medical

## 2020-11-08 DIAGNOSIS — M4635 Infection of intervertebral disc (pyogenic), thoracolumbar region: Secondary | ICD-10-CM | POA: Diagnosis not present

## 2020-11-08 DIAGNOSIS — G062 Extradural and subdural abscess, unspecified: Secondary | ICD-10-CM | POA: Diagnosis not present

## 2020-11-08 DIAGNOSIS — M4325 Fusion of spine, thoracolumbar region: Secondary | ICD-10-CM | POA: Diagnosis not present

## 2020-11-09 ENCOUNTER — Other Ambulatory Visit: Payer: Self-pay | Admitting: Medical

## 2020-11-09 ENCOUNTER — Other Ambulatory Visit: Payer: Medicare Other

## 2020-11-09 DIAGNOSIS — G062 Extradural and subdural abscess, unspecified: Secondary | ICD-10-CM | POA: Diagnosis not present

## 2020-11-09 DIAGNOSIS — M4325 Fusion of spine, thoracolumbar region: Secondary | ICD-10-CM | POA: Diagnosis not present

## 2020-11-09 DIAGNOSIS — M4635 Infection of intervertebral disc (pyogenic), thoracolumbar region: Secondary | ICD-10-CM | POA: Diagnosis not present

## 2020-11-09 NOTE — Telephone Encounter (Signed)
Called pt and appt made for 4/26

## 2020-11-09 NOTE — Telephone Encounter (Signed)
appt scheduled with pt

## 2020-11-10 ENCOUNTER — Emergency Department (HOSPITAL_BASED_OUTPATIENT_CLINIC_OR_DEPARTMENT_OTHER): Payer: Medicare Other

## 2020-11-10 ENCOUNTER — Inpatient Hospital Stay (HOSPITAL_BASED_OUTPATIENT_CLINIC_OR_DEPARTMENT_OTHER)
Admission: EM | Admit: 2020-11-10 | Discharge: 2020-11-23 | DRG: 853 | Disposition: A | Discharge status: Home Health | Payer: Medicare Other | Source: Ambulatory Visit | Attending: Internal Medicine | Admitting: Internal Medicine

## 2020-11-10 ENCOUNTER — Ambulatory Visit (INDEPENDENT_AMBULATORY_CARE_PROVIDER_SITE_OTHER): Payer: Medicare Other | Admitting: Medical

## 2020-11-10 ENCOUNTER — Other Ambulatory Visit: Payer: Self-pay

## 2020-11-10 ENCOUNTER — Encounter (HOSPITAL_BASED_OUTPATIENT_CLINIC_OR_DEPARTMENT_OTHER): Payer: Self-pay

## 2020-11-10 VITALS — BP 145/71 | HR 57 | Resp 18 | Ht 70.0 in | Wt 168.6 lb

## 2020-11-10 DIAGNOSIS — T82868A Thrombosis of vascular prosthetic devices, implants and grafts, initial encounter: Secondary | ICD-10-CM | POA: Diagnosis not present

## 2020-11-10 DIAGNOSIS — Z87891 Personal history of nicotine dependence: Secondary | ICD-10-CM

## 2020-11-10 DIAGNOSIS — F419 Anxiety disorder, unspecified: Secondary | ICD-10-CM | POA: Diagnosis present

## 2020-11-10 DIAGNOSIS — Z809 Family history of malignant neoplasm, unspecified: Secondary | ICD-10-CM | POA: Diagnosis not present

## 2020-11-10 DIAGNOSIS — Z7982 Long term (current) use of aspirin: Secondary | ICD-10-CM

## 2020-11-10 DIAGNOSIS — Y95 Nosocomial condition: Secondary | ICD-10-CM | POA: Diagnosis present

## 2020-11-10 DIAGNOSIS — J8281 Chronic eosinophilic pneumonia: Secondary | ICD-10-CM | POA: Diagnosis not present

## 2020-11-10 DIAGNOSIS — G062 Extradural and subdural abscess, unspecified: Secondary | ICD-10-CM

## 2020-11-10 DIAGNOSIS — Z7984 Long term (current) use of oral hypoglycemic drugs: Secondary | ICD-10-CM

## 2020-11-10 DIAGNOSIS — E1142 Type 2 diabetes mellitus with diabetic polyneuropathy: Secondary | ICD-10-CM | POA: Diagnosis present

## 2020-11-10 DIAGNOSIS — Z20822 Contact with and (suspected) exposure to covid-19: Secondary | ICD-10-CM | POA: Diagnosis not present

## 2020-11-10 DIAGNOSIS — Z8249 Family history of ischemic heart disease and other diseases of the circulatory system: Secondary | ICD-10-CM

## 2020-11-10 DIAGNOSIS — Z981 Arthrodesis status: Secondary | ICD-10-CM | POA: Diagnosis not present

## 2020-11-10 DIAGNOSIS — J189 Pneumonia, unspecified organism: Secondary | ICD-10-CM | POA: Diagnosis not present

## 2020-11-10 DIAGNOSIS — G8929 Other chronic pain: Secondary | ICD-10-CM | POA: Diagnosis present

## 2020-11-10 DIAGNOSIS — I82621 Acute embolism and thrombosis of deep veins of right upper extremity: Secondary | ICD-10-CM | POA: Diagnosis not present

## 2020-11-10 DIAGNOSIS — E1165 Type 2 diabetes mellitus with hyperglycemia: Secondary | ICD-10-CM

## 2020-11-10 DIAGNOSIS — R21 Rash and other nonspecific skin eruption: Secondary | ICD-10-CM | POA: Diagnosis not present

## 2020-11-10 DIAGNOSIS — R0600 Dyspnea, unspecified: Secondary | ICD-10-CM

## 2020-11-10 DIAGNOSIS — E119 Type 2 diabetes mellitus without complications: Secondary | ICD-10-CM

## 2020-11-10 DIAGNOSIS — R0602 Shortness of breath: Secondary | ICD-10-CM | POA: Diagnosis not present

## 2020-11-10 DIAGNOSIS — L089 Local infection of the skin and subcutaneous tissue, unspecified: Secondary | ICD-10-CM

## 2020-11-10 DIAGNOSIS — Z83438 Family history of other disorder of lipoprotein metabolism and other lipidemia: Secondary | ICD-10-CM | POA: Diagnosis not present

## 2020-11-10 DIAGNOSIS — Z79899 Other long term (current) drug therapy: Secondary | ICD-10-CM | POA: Diagnosis not present

## 2020-11-10 DIAGNOSIS — J8282 Acute eosinophilic pneumonia: Secondary | ICD-10-CM | POA: Diagnosis present

## 2020-11-10 DIAGNOSIS — M8660 Other chronic osteomyelitis, unspecified site: Secondary | ICD-10-CM | POA: Diagnosis not present

## 2020-11-10 DIAGNOSIS — J9601 Acute respiratory failure with hypoxia: Secondary | ICD-10-CM | POA: Diagnosis not present

## 2020-11-10 DIAGNOSIS — Z8262 Family history of osteoporosis: Secondary | ICD-10-CM | POA: Diagnosis not present

## 2020-11-10 DIAGNOSIS — R7881 Bacteremia: Secondary | ICD-10-CM | POA: Diagnosis not present

## 2020-11-10 DIAGNOSIS — R0902 Hypoxemia: Secondary | ICD-10-CM

## 2020-11-10 DIAGNOSIS — Z833 Family history of diabetes mellitus: Secondary | ICD-10-CM

## 2020-11-10 DIAGNOSIS — Z9889 Other specified postprocedural states: Secondary | ICD-10-CM | POA: Diagnosis not present

## 2020-11-10 DIAGNOSIS — Z823 Family history of stroke: Secondary | ICD-10-CM

## 2020-11-10 DIAGNOSIS — I82A11 Acute embolism and thrombosis of right axillary vein: Secondary | ICD-10-CM | POA: Diagnosis not present

## 2020-11-10 DIAGNOSIS — R911 Solitary pulmonary nodule: Secondary | ICD-10-CM | POA: Diagnosis not present

## 2020-11-10 DIAGNOSIS — T50905A Adverse effect of unspecified drugs, medicaments and biological substances, initial encounter: Secondary | ICD-10-CM

## 2020-11-10 DIAGNOSIS — J159 Unspecified bacterial pneumonia: Secondary | ICD-10-CM | POA: Diagnosis present

## 2020-11-10 DIAGNOSIS — J918 Pleural effusion in other conditions classified elsewhere: Secondary | ICD-10-CM | POA: Diagnosis present

## 2020-11-10 DIAGNOSIS — M4644 Discitis, unspecified, thoracic region: Secondary | ICD-10-CM | POA: Diagnosis not present

## 2020-11-10 DIAGNOSIS — D649 Anemia, unspecified: Secondary | ICD-10-CM | POA: Diagnosis present

## 2020-11-10 DIAGNOSIS — A419 Sepsis, unspecified organism: Secondary | ICD-10-CM | POA: Diagnosis not present

## 2020-11-10 DIAGNOSIS — R61 Generalized hyperhidrosis: Secondary | ICD-10-CM | POA: Diagnosis not present

## 2020-11-10 DIAGNOSIS — T847XXD Infection and inflammatory reaction due to other internal orthopedic prosthetic devices, implants and grafts, subsequent encounter: Secondary | ICD-10-CM | POA: Diagnosis not present

## 2020-11-10 DIAGNOSIS — Y848 Other medical procedures as the cause of abnormal reaction of the patient, or of later complication, without mention of misadventure at the time of the procedure: Secondary | ICD-10-CM | POA: Diagnosis not present

## 2020-11-10 DIAGNOSIS — J90 Pleural effusion, not elsewhere classified: Secondary | ICD-10-CM | POA: Diagnosis not present

## 2020-11-10 DIAGNOSIS — E871 Hypo-osmolality and hyponatremia: Secondary | ICD-10-CM | POA: Diagnosis not present

## 2020-11-10 DIAGNOSIS — E1169 Type 2 diabetes mellitus with other specified complication: Secondary | ICD-10-CM | POA: Diagnosis present

## 2020-11-10 DIAGNOSIS — E785 Hyperlipidemia, unspecified: Secondary | ICD-10-CM | POA: Diagnosis present

## 2020-11-10 DIAGNOSIS — T368X5A Adverse effect of other systemic antibiotics, initial encounter: Secondary | ICD-10-CM | POA: Diagnosis present

## 2020-11-10 DIAGNOSIS — T8149XA Infection following a procedure, other surgical site, initial encounter: Secondary | ICD-10-CM | POA: Diagnosis not present

## 2020-11-10 DIAGNOSIS — Z825 Family history of asthma and other chronic lower respiratory diseases: Secondary | ICD-10-CM

## 2020-11-10 DIAGNOSIS — J702 Acute drug-induced interstitial lung disorders: Secondary | ICD-10-CM | POA: Diagnosis present

## 2020-11-10 DIAGNOSIS — G4734 Idiopathic sleep related nonobstructive alveolar hypoventilation: Secondary | ICD-10-CM

## 2020-11-10 DIAGNOSIS — I82409 Acute embolism and thrombosis of unspecified deep veins of unspecified lower extremity: Secondary | ICD-10-CM

## 2020-11-10 DIAGNOSIS — N400 Enlarged prostate without lower urinary tract symptoms: Secondary | ICD-10-CM | POA: Diagnosis present

## 2020-11-10 DIAGNOSIS — B957 Other staphylococcus as the cause of diseases classified elsewhere: Secondary | ICD-10-CM | POA: Diagnosis not present

## 2020-11-10 DIAGNOSIS — T888XXD Other specified complications of surgical and medical care, not elsewhere classified, subsequent encounter: Secondary | ICD-10-CM | POA: Diagnosis not present

## 2020-11-10 DIAGNOSIS — E782 Mixed hyperlipidemia: Secondary | ICD-10-CM | POA: Diagnosis not present

## 2020-11-10 DIAGNOSIS — Y929 Unspecified place or not applicable: Secondary | ICD-10-CM | POA: Diagnosis not present

## 2020-11-10 DIAGNOSIS — M4635 Infection of intervertebral disc (pyogenic), thoracolumbar region: Secondary | ICD-10-CM | POA: Diagnosis not present

## 2020-11-10 DIAGNOSIS — J984 Other disorders of lung: Secondary | ICD-10-CM

## 2020-11-10 DIAGNOSIS — T148XXA Other injury of unspecified body region, initial encounter: Secondary | ICD-10-CM | POA: Diagnosis not present

## 2020-11-10 DIAGNOSIS — M4325 Fusion of spine, thoracolumbar region: Secondary | ICD-10-CM | POA: Diagnosis not present

## 2020-11-10 DIAGNOSIS — M4624 Osteomyelitis of vertebra, thoracic region: Secondary | ICD-10-CM | POA: Diagnosis not present

## 2020-11-10 DIAGNOSIS — I10 Essential (primary) hypertension: Secondary | ICD-10-CM | POA: Diagnosis present

## 2020-11-10 DIAGNOSIS — M545 Low back pain, unspecified: Secondary | ICD-10-CM | POA: Diagnosis present

## 2020-11-10 DIAGNOSIS — T888XXA Other specified complications of surgical and medical care, not elsewhere classified, initial encounter: Secondary | ICD-10-CM

## 2020-11-10 DIAGNOSIS — F32A Depression, unspecified: Secondary | ICD-10-CM | POA: Diagnosis present

## 2020-11-10 DIAGNOSIS — E7849 Other hyperlipidemia: Secondary | ICD-10-CM | POA: Diagnosis present

## 2020-11-10 LAB — BASIC METABOLIC PANEL
Anion gap: 13 (ref 5–15)
BUN: 15 mg/dL (ref 8–23)
CO2: 23 mmol/L (ref 22–32)
Calcium: 8.8 mg/dL — ABNORMAL LOW (ref 8.9–10.3)
Chloride: 94 mmol/L — ABNORMAL LOW (ref 98–111)
Creatinine, Ser: 0.5 mg/dL — ABNORMAL LOW (ref 0.61–1.24)
GFR, Estimated: >60 mL/min (ref >60)
Glucose, Bld: 136 mg/dL — ABNORMAL HIGH (ref 70–99)
Potassium: 3.8 mmol/L (ref 3.5–5.1)
Sodium: 130 mmol/L — ABNORMAL LOW (ref 135–145)

## 2020-11-10 LAB — CBC WITH DIFFERENTIAL/PLATELET
Abs Immature Granulocytes: 0.05 10*3/uL (ref 0.00–0.07)
Basophils Absolute: 0 10*3/uL (ref 0.0–0.1)
Basophils Relative: 0 %
Eosinophils Absolute: 0.4 10*3/uL (ref 0.0–0.5)
Eosinophils Relative: 2 %
HCT: 37.8 % — ABNORMAL LOW (ref 39.0–52.0)
Hemoglobin: 12.6 g/dL — ABNORMAL LOW (ref 13.0–17.0)
Immature Granulocytes: 0 %
Lymphocytes Relative: 3 %
Lymphs Abs: 0.5 10*3/uL — ABNORMAL LOW (ref 0.7–4.0)
MCH: 30.4 pg (ref 26.0–34.0)
MCHC: 33.3 g/dL (ref 30.0–36.0)
MCV: 91.1 fL (ref 80.0–100.0)
Monocytes Absolute: 1.7 10*3/uL — ABNORMAL HIGH (ref 0.1–1.0)
Monocytes Relative: 11 %
Neutro Abs: 12.3 10*3/uL — ABNORMAL HIGH (ref 1.7–7.7)
Neutrophils Relative %: 84 %
Platelets: 511 10*3/uL — ABNORMAL HIGH (ref 150–400)
RBC: 4.15 MIL/uL — ABNORMAL LOW (ref 4.22–5.81)
RDW: 13.5 % (ref 11.5–15.5)
WBC: 14.9 10*3/uL — ABNORMAL HIGH (ref 4.0–10.5)
nRBC: 0 % (ref 0.0–0.2)

## 2020-11-10 LAB — RESP PANEL BY RT-PCR (FLU A&B, COVID) ARPGX2
Influenza A by PCR: NEGATIVE
Influenza B by PCR: NEGATIVE
SARS Coronavirus 2 by RT PCR: NEGATIVE

## 2020-11-10 LAB — BRAIN NATRIURETIC PEPTIDE: B Natriuretic Peptide: 68.6 pg/mL (ref 0.0–100.0)

## 2020-11-10 LAB — LACTIC ACID, PLASMA: Lactic Acid, Venous: 0.8 mmol/L (ref 0.5–1.9)

## 2020-11-10 MED ORDER — INSULIN ASPART 100 UNIT/ML ~~LOC~~ SOLN
0.0000 [IU] | Freq: Three times a day (TID) | SUBCUTANEOUS | Status: DC
  Administered 2020-11-11: 1 [IU] via SUBCUTANEOUS
  Administered 2020-11-11: 2 [IU] via SUBCUTANEOUS
  Administered 2020-11-11 – 2020-11-12 (×2): 1 [IU] via SUBCUTANEOUS
  Administered 2020-11-12 (×2): 2 [IU] via SUBCUTANEOUS
  Administered 2020-11-13: 3 [IU] via SUBCUTANEOUS
  Administered 2020-11-13: 2 [IU] via SUBCUTANEOUS
  Administered 2020-11-13 – 2020-11-14 (×2): 3 [IU] via SUBCUTANEOUS
  Administered 2020-11-14 – 2020-11-15 (×3): 2 [IU] via SUBCUTANEOUS
  Administered 2020-11-15: 5 [IU] via SUBCUTANEOUS
  Administered 2020-11-15: 3 [IU] via SUBCUTANEOUS
  Administered 2020-11-16: 7 [IU] via SUBCUTANEOUS
  Administered 2020-11-16: 2 [IU] via SUBCUTANEOUS
  Administered 2020-11-16: 1 [IU] via SUBCUTANEOUS
  Administered 2020-11-16 – 2020-11-17 (×2): 3 [IU] via SUBCUTANEOUS
  Administered 2020-11-17: 1 [IU] via SUBCUTANEOUS

## 2020-11-10 MED ORDER — VENLAFAXINE HCL ER 150 MG PO CP24
150.0000 mg | ORAL_CAPSULE | Freq: Every day | ORAL | Status: DC
  Administered 2020-11-11 – 2020-11-23 (×12): 150 mg via ORAL
  Filled 2020-11-10 (×12): qty 1

## 2020-11-10 MED ORDER — VANCOMYCIN HCL IN DEXTROSE 1-5 GM/200ML-% IV SOLN
1000.0000 mg | Freq: Two times a day (BID) | INTRAVENOUS | Status: AC
  Administered 2020-11-10: 1750 mg via INTRAVENOUS
  Administered 2020-11-11 – 2020-11-14 (×8): 1000 mg via INTRAVENOUS
  Filled 2020-11-10 (×8): qty 200

## 2020-11-10 MED ORDER — SUCRALFATE 1 G PO TABS
1.0000 g | ORAL_TABLET | Freq: Three times a day (TID) | ORAL | Status: DC
  Administered 2020-11-11 – 2020-11-23 (×48): 1 g via ORAL
  Filled 2020-11-10 (×48): qty 1

## 2020-11-10 MED ORDER — BUSPIRONE HCL 5 MG PO TABS
7.5000 mg | ORAL_TABLET | Freq: Two times a day (BID) | ORAL | Status: DC
  Administered 2020-11-11 – 2020-11-23 (×25): 7.5 mg via ORAL
  Filled 2020-11-10 (×25): qty 2

## 2020-11-10 MED ORDER — CYCLOBENZAPRINE HCL 10 MG PO TABS
10.0000 mg | ORAL_TABLET | Freq: Once | ORAL | Status: AC
  Administered 2020-11-10: 10 mg via ORAL
  Filled 2020-11-10: qty 1

## 2020-11-10 MED ORDER — VANCOMYCIN HCL 1750 MG/350ML IV SOLN
1750.0000 mg | INTRAVENOUS | Status: DC
  Filled 2020-11-10: qty 350

## 2020-11-10 MED ORDER — LACTATED RINGERS IV BOLUS
1000.0000 mL | Freq: Once | INTRAVENOUS | Status: AC
  Administered 2020-11-10: 1000 mL via INTRAVENOUS

## 2020-11-10 MED ORDER — FAMOTIDINE 20 MG PO TABS
20.0000 mg | ORAL_TABLET | Freq: Every day | ORAL | Status: DC
  Administered 2020-11-11 – 2020-11-23 (×12): 20 mg via ORAL
  Filled 2020-11-10 (×12): qty 1

## 2020-11-10 MED ORDER — HYDROCODONE-ACETAMINOPHEN 5-325 MG PO TABS
1.0000 | ORAL_TABLET | Freq: Once | ORAL | Status: AC
  Administered 2020-11-10: 1 via ORAL
  Filled 2020-11-10: qty 1

## 2020-11-10 MED ORDER — PANTOPRAZOLE SODIUM 40 MG PO TBEC
40.0000 mg | DELAYED_RELEASE_TABLET | Freq: Two times a day (BID) | ORAL | Status: DC
  Administered 2020-11-11 – 2020-11-23 (×24): 40 mg via ORAL
  Filled 2020-11-10 (×24): qty 1

## 2020-11-10 MED ORDER — ATORVASTATIN CALCIUM 10 MG PO TABS
10.0000 mg | ORAL_TABLET | Freq: Every day | ORAL | Status: DC
  Administered 2020-11-11 – 2020-11-23 (×13): 10 mg via ORAL
  Filled 2020-11-10 (×13): qty 1

## 2020-11-10 MED ORDER — SODIUM CHLORIDE 0.9 % IV SOLN
500.0000 mg | INTRAVENOUS | Status: DC
  Administered 2020-11-11: 500 mg via INTRAVENOUS
  Filled 2020-11-10: qty 500

## 2020-11-10 MED ORDER — ALBUTEROL SULFATE HFA 108 (90 BASE) MCG/ACT IN AERS
2.0000 | INHALATION_SPRAY | Freq: Once | RESPIRATORY_TRACT | Status: AC
  Administered 2020-11-10: 2 via RESPIRATORY_TRACT
  Filled 2020-11-10: qty 6.7

## 2020-11-10 MED ORDER — SODIUM CHLORIDE 0.9 % IV SOLN: INTRAVENOUS | Status: DC | PRN

## 2020-11-10 MED ORDER — SODIUM CHLORIDE 0.9 % IV SOLN
2.0000 g | Freq: Three times a day (TID) | INTRAVENOUS | Status: DC
  Administered 2020-11-10 – 2020-11-14 (×11): 2 g via INTRAVENOUS
  Filled 2020-11-10 (×14): qty 2

## 2020-11-10 MED ORDER — ZOLPIDEM TARTRATE 5 MG PO TABS
5.0000 mg | ORAL_TABLET | Freq: Every evening | ORAL | Status: DC | PRN
  Administered 2020-11-11 – 2020-11-22 (×12): 5 mg via ORAL
  Filled 2020-11-10 (×12): qty 1

## 2020-11-10 MED ORDER — AEROCHAMBER PLUS FLO-VU MISC
1.0000 | Freq: Once | Status: AC
  Administered 2020-11-10: 1
  Filled 2020-11-10: qty 1

## 2020-11-10 MED ORDER — SODIUM CHLORIDE 0.9 % IV SOLN: INTRAVENOUS | Status: AC

## 2020-11-10 MED ORDER — PREGABALIN 75 MG PO CAPS
75.0000 mg | ORAL_CAPSULE | Freq: Two times a day (BID) | ORAL | Status: DC
  Administered 2020-11-11 – 2020-11-23 (×25): 75 mg via ORAL
  Filled 2020-11-10 (×26): qty 1

## 2020-11-10 MED ORDER — HYDROCODONE-ACETAMINOPHEN 7.5-325 MG PO TABS
1.0000 | ORAL_TABLET | ORAL | Status: DC | PRN
  Administered 2020-11-11 – 2020-11-15 (×18): 1 via ORAL
  Filled 2020-11-10 (×18): qty 1

## 2020-11-10 MED ORDER — SODIUM CHLORIDE 0.9 % IV BOLUS: 500.0000 mL | Freq: Once | INTRAVENOUS | Status: DC

## 2020-11-10 MED ORDER — FERROUS FUMARATE 324 (106 FE) MG PO TABS
1.0000 | ORAL_TABLET | Freq: Every day | ORAL | Status: DC
  Administered 2020-11-11 – 2020-11-23 (×12): 106 mg via ORAL
  Filled 2020-11-10 (×13): qty 1

## 2020-11-10 MED ORDER — ENOXAPARIN SODIUM 40 MG/0.4ML ~~LOC~~ SOLN
40.0000 mg | SUBCUTANEOUS | Status: DC
  Administered 2020-11-11 – 2020-11-16 (×5): 40 mg via SUBCUTANEOUS
  Filled 2020-11-10 (×6): qty 0.4

## 2020-11-10 MED ORDER — TIZANIDINE HCL 4 MG PO TABS: 6.0000 mg | ORAL_TABLET | Freq: Once | ORAL | Status: DC

## 2020-11-10 MED ORDER — TIZANIDINE HCL 4 MG PO TABS
6.0000 mg | ORAL_TABLET | Freq: Four times a day (QID) | ORAL | Status: DC | PRN
  Administered 2020-11-11 – 2020-11-23 (×31): 6 mg via ORAL
  Filled 2020-11-10 (×31): qty 2

## 2020-11-10 MED ORDER — IPRATROPIUM-ALBUTEROL 0.5-2.5 (3) MG/3ML IN SOLN
3.0000 mL | Freq: Four times a day (QID) | RESPIRATORY_TRACT | Status: DC | PRN
  Administered 2020-11-10 – 2020-11-13 (×5): 3 mL via RESPIRATORY_TRACT
  Filled 2020-11-10 (×5): qty 3

## 2020-11-10 MED ORDER — ALBUTEROL SULFATE HFA 108 (90 BASE) MCG/ACT IN AERS
2.0000 | INHALATION_SPRAY | Freq: Four times a day (QID) | RESPIRATORY_TRACT | Status: DC | PRN
  Filled 2020-11-10: qty 6.7

## 2020-11-10 MED ORDER — IOHEXOL 350 MG/ML SOLN
75.0000 mL | Freq: Once | INTRAVENOUS | Status: AC | PRN
  Administered 2020-11-10: 75 mL via INTRAVENOUS

## 2020-11-10 MED ORDER — VANCOMYCIN HCL 1000 MG IV SOLR
INTRAVENOUS | Status: AC
  Administered 2020-11-10: 1000 mg via INTRAVENOUS
  Filled 2020-11-10: qty 2000

## 2020-11-10 MED ORDER — HYDROCODONE-ACETAMINOPHEN 5-325 MG PO TABS
1.0000 | ORAL_TABLET | Freq: Once | ORAL | Status: AC
Start: 2020-11-10 — End: 2020-11-10
  Administered 2020-11-10: 1 via ORAL
  Filled 2020-11-10: qty 1

## 2020-11-10 MED ORDER — TAMSULOSIN HCL 0.4 MG PO CAPS
0.8000 mg | ORAL_CAPSULE | Freq: Every day | ORAL | Status: DC
  Administered 2020-11-11 – 2020-11-23 (×13): 0.8 mg via ORAL
  Filled 2020-11-10 (×13): qty 2

## 2020-11-10 NOTE — ED Notes (Signed)
Lab orders etc rec'd from ED PA, orders rec may use PICC line if needed. Labs drawn from PICC, port was cleaned per policy along with strict handwashing and gloves utilized

## 2020-11-10 NOTE — ED Notes (Signed)
Handoff report given to [place] RN.

## 2020-11-10 NOTE — Progress Notes (Signed)
(  Sent page to [place].) @ 2230  1409-Luis Brown. Pt transfer from OSH. vitals stable. Thanks Abby, Therapist, sports

## 2020-11-10 NOTE — ED Notes (Signed)
States he feels much better and breathing is easier post HHN tx from RT staff. Remains on cont cardiac monitoring with cont POX and int NBP assessments

## 2020-11-10 NOTE — ED Notes (Signed)
Ambulated from triage to room 10, SpO2 86% HR124 max, +DOE, stopped x2 coming back to room do to coughing and SHOB.

## 2020-11-10 NOTE — ED Triage Notes (Signed)
Pt c/o cough, SOB x 1 week-was sent from PCP office in the building-RT in triage for assessment-pt with DOE noted

## 2020-11-10 NOTE — Progress Notes (Addendum)
Pharmacy Antibiotic Note  Luis Brown is a 75 y.o. male admitted on 11/10/2020 with pneumonia.  Pharmacy has been consulted for vancomycin and cefepime dosing.  Plan: Cefepime 2g IV q8h Vancomycin 1000mg  IV q12h (goal trough 15-20)  - Monitor renal function and clinical improvement - F/u culture data, LOT, and de-escalation     Temp (24hrs), Avg:98.3 F (36.8 C), Min:98.3 F (36.8 C), Max:98.3 F (36.8 C)  Recent Labs  Lab 11/10/20 1629 11/10/20 1654  WBC 14.9*  --   CREATININE 0.50*  --   LATICACIDVEN  --  0.8    Estimated Creatinine Clearance: 79.8 mL/min (A) (by C-G formula based on SCr of 0.5 mg/dL (L)).    No Known Allergies  Antimicrobials this admission: Cefepime 4/26 >> Vancomycin 4/26 >>   Dose adjustments this admission:   Microbiology results: 4/26 Bcx:   Thank you for allowing pharmacy to be a part of this patient's care.  Claudina Lick, PharmD PGY1 Acute Care Pharmacy Resident 11/10/2020 6:07 PM  Please check [email or web address] for unit-specific pharmacy phone numbers.

## 2020-11-10 NOTE — Progress Notes (Signed)
Subjective:    [name], male    DOB: May 25, 1946, 75 y.o.   MRN: [id number]  HPI  Pt in for follow up.  Pt in for feeling shortness of breath. Pt states he can't take breath normally. He states he feels like he can't breath normal. He feels short of breath even at rest. Pt states walking short distance very short of breath.  Pt states felt worse since Saturday. Pt feeling warm. Sweating at night 2-3 nights. Sheets soaked. His temp has been 97-99.   Last visit had mild description of allergies and expressed concern that he might start to wheeze. I refilled his albuterol.  Also recent periodic very low bp. Pt bp have been   Pt still having some back pain. Hx of spinal infection. Pt still getting antibiotic thru pic line.   Pt sugars have been running high.   Pt blood pressures have been mostly low. But occasionally    On ambulation 122 and 02 sat 87%.   No chest except when coughs.  Review of Systems  Constitutional: Positive for diaphoresis and fatigue. Negative for chills and fever.  HENT: Negative for congestion and ear pain.   Respiratory: Positive for cough and shortness of breath. Negative for chest tightness and wheezing.   Cardiovascular: Negative for chest pain and palpitations.  Gastrointestinal: Negative for abdominal pain.  Musculoskeletal: Positive for back pain.       Mild rt politeal pai.  Skin: Negative for rash.  Hematological: Negative for adenopathy. Does not bruise/bleed easily.  Psychiatric/Behavioral: Negative for behavioral problems, confusion, sleep disturbance and suicidal ideas. The patient is not nervous/anxious.      Past Medical History:  Diagnosis Date  . Acute pharyngitis 10/21/2013  . Allergy    grass and pollen  . Anxiety and depression 10/25/2011  . BPH (benign prostatic hyperplasia) 04/23/2012  . Chicken pox as a child  . DDD (degenerative disc disease)    cervical responds to steroid injections and low back required  surgery  . DDD (degenerative disc disease), lumbosacral   . Diabetes mellitus    pre  . Dyspnea   . ED (erectile dysfunction) 04/23/2012  . Elevated BP   . 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 Esophageal reflux 02/10/2015  . Fatigue   . HTN (hypertension)   . Hyperglycemia    preDM   . Hyperlipidemia   . Insomnia   . Low back pain radiating to both legs 01/16/2017  . Measles as a child  . Medicare annual wellness visit, subsequent 02/10/2015  . Overweight(278.02)   . Peripheral neuropathy 08/07/2019  . Personal history of colonic polyps 10/27/2012   Follows with [facility]  . Pneumonia    " walking"  . Preventative health care   . 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  . Wears glasses      Social History   Socioeconomic History  . Marital status: Married    Spouse name: Not on file  . Number of children: Not on file  . Years of education: Not on file  . Highest education level: Not on file  Occupational History  . Not on file  Tobacco Use  . Smoking status: Former Smoker    Packs/day: 1.00    Years: 20.00    Pack years: 20.00    Types: Cigarettes    Quit date: 07/18/1989    Years since quitting: 31.3  . Smokeless tobacco: Never Used  . Tobacco comment: off and on smoking for the 20  yrs  Vaping Use  . Vaping Use: Never used  Substance and Sexual Activity  . Alcohol use: Not Currently    Comment: drink or two a day- mixed drink  . Drug use: No  . Sexual activity: Yes    Comment: lives with wife, still working, no dietary restrictions, continues to exercise intermittently  Other Topics Concern  . Not on file  Social History Narrative  . Not on file   Social Determinants of Health   Financial Resource Strain: Not on file  Food Insecurity: Not on file  Transportation Needs: Not on file  Physical Activity: Not on file  Stress: Not on file  Social Connections: Not on file  Intimate Partner Violence: Not on file    Past Surgical History:  Procedure Laterality  Date  . BACK SURGERY  2012 and 1994   Dr Margret Chance, screws and cage in low back  . BIOPSY  01/09/2020   Procedure: BIOPSY;  Surgeon: Otis Brace, MD;  Location: [facility];  Service: Gastroenterology;;  . COLONOSCOPY WITH PROPOFOL N/A 01/09/2020   Procedure: COLONOSCOPY WITH PROPOFOL;  Surgeon: Otis Brace, MD;  Location: [facility];  Service: Gastroenterology;  Laterality: N/A;  . ESOPHAGOGASTRODUODENOSCOPY (EGD) WITH PROPOFOL N/A 01/09/2020   Procedure: ESOPHAGOGASTRODUODENOSCOPY (EGD) WITH PROPOFOL;  Surgeon: Otis Brace, MD;  Location: [facility];  Service: Gastroenterology;  Laterality: N/A;  . EYE SURGERY Bilateral    2016  . HARDWARE REVISION  03/12/2019   REVISION OF HARDWARE THORACIC TEN-THORACIC ELEVEN WITH APPLICATION OF ADDITIONAL RODS AND ROD SLEEVES THORACIC EIGHT-THORACIC TWELVE 12-LUMBAR ONE (N/A )  . LAMINECTOMY  02/12/2019   DECOMPRESSIVE LAMINECTOMY THORACIC NINE-THORACIC TEN AND THORACIC TEN-THORACIC ELEVEN, EXTENSION OF THORACOLUMBAR FUSION FROM THORACIC TEN TO THORACIC FIVE, LOCAL BONE GRAFT, ALLOGRAFT AND VIVIGEN (N/A)  . POLYPECTOMY  01/09/2020   Procedure: POLYPECTOMY;  Surgeon: Otis Brace, MD;  Location: [facility];  Service: Gastroenterology;;  . REMOVAL OF RODS AND PEDICLE SCREWS T5 AND T6, EXPLORATION OF FUSION, BIOPSY TRANSPEDICULAR T5 AND T6 (N/A )  09/14/2020  . SPINAL FUSION N/A 02/12/2019   Procedure: DECOMPRESSIVE LAMINECTOMY THORACIC NINE-THORACIC TEN AND THORACIC TEN-THORACIC ELEVEN, EXTENSION OF THORACOLUMBAR FUSION FROM THORACIC TEN TO THORACIC FIVE, LOCAL BONE GRAFT, ALLOGRAFT AND VIVIGEN;  Surgeon: Jessy Oto, MD;  Location: [place];  Service: Orthopedics;  Laterality: N/A;  DECOMPRESSIVE LAMINECTOMY THORACIC NINE-THORACIC TEN AND THORACIC TEN-THORACIC ELEVEN, EXTENSION OF TH  . SPINAL FUSION N/A 03/12/2019   Procedure: REVISION OF HARDWARE THORACIC TEN-THORACIC ELEVEN WITH APPLICATION OF ADDITIONAL RODS AND ROD SLEEVES THORACIC  EIGHT-THORACIC TWELVE 12-LUMBAR ONE;  Surgeon: Jessy Oto, MD;  Location: [place];  Service: Orthopedics;  Laterality: N/A;  . TONSILLECTOMY    . TONSILLECTOMY     as child  . torn rotator cuff  2010   right    Family History  Problem Relation Age of Onset  . Hypertension Mother   . Diabetes Mother        type 2  . Cancer Mother 21       breast in remission  . Emphysema Father        smoker  . COPD Father        smoker  . Stroke Father 33       mini  . Heart disease Father   . Hypertension Sister   . Hyperlipidemia Sister   . Scoliosis Sister   . Osteoporosis Sister   . Hypertension Maternal Grandmother   . Scoliosis Maternal Grandmother   .  Heart disease Maternal Grandfather   . Heart disease Paternal Grandfather        smoker  . Heart disease Daughter     No Known Allergies  Current Outpatient Medications on File Prior to Visit  Medication Sig Dispense Refill  . albuterol (VENTOLIN HFA) 108 (90 Base) MCG/ACT inhaler Inhale 2 puffs into the lungs every 6 (six) hours as needed. 18 g 0  . atorvastatin (LIPITOR) 10 MG tablet Take 1 tablet (10 mg total) by mouth daily. 30 tablet 0  . b complex vitamins capsule Take 1 capsule by mouth daily.    Marland Kitchen BAYER ASPIRIN EC LOW DOSE 81 MG EC tablet Take 81 mg by mouth every morning.    . busPIRone (BUSPAR) 7.5 MG tablet 1-2 TAB BY MOUTH 3 TIMES A DAY FOR ANXIETY (Patient taking differently: Take 7.5 mg by mouth 2 (two) times daily.) 540 tablet 1  . cetirizine (ZYRTEC) 10 MG tablet Take 10 mg by mouth daily. In the morning    . famotidine (PEPCID) 40 MG tablet TAKE 1 TABLET BY MOUTH EVERYDAY AT BEDTIME 90 tablet 1  . ferrous fumarate (FERRETTS) 325 (106 Fe) MG TABS tablet Take 1 tablet (106 mg of iron total) by mouth daily. 90 tablet 3  . glucose blood (CONTOUR NEXT TEST) test strip Check blood sugar once daily 100 each 12  . JANUVIA 50 MG tablet TAKE 1 TABLET BY MOUTH EVERY DAY (Patient taking differently: Take 50 mg by mouth  daily.) 30 tablet 3  . metFORMIN (GLUCOPHAGE) 1000 MG tablet Take 1 tablet (1,000 mg total) by mouth 2 (two) times daily with a meal. 180 tablet 0  . olmesartan (BENICAR) 5 MG tablet Take 1 tablet (5 mg total) by mouth daily. 30 tablet 2  . pantoprazole (PROTONIX) 40 MG tablet TAKE 1 TABLET BY MOUTH TWICE A DAY BEFORE A MEAL (Patient taking differently: Take 40 mg by mouth 2 (two) times daily before a meal.) 180 tablet 1  . pregabalin (LYRICA) 75 MG capsule TAKE 1 CAPSULE BY MOUTH TWICE A DAY (Patient taking differently: Take 75 mg by mouth 2 (two) times daily.) 60 capsule 3  . sodium polystyrene (KAYEXALATE) 15 GM/60ML suspension 60 ml po q day for 4 days 240 mL 0  . sucralfate (CARAFATE) 1 g tablet Take 1 tablet (1 g total) by mouth 4 (four) times daily -  with meals and at bedtime. 120 tablet 1  . tamsulosin (FLOMAX) 0.4 MG CAPS capsule Take 2 capsules (0.8 mg total) by mouth daily. 180 capsule 3  . testosterone cypionate (DEPOTESTOSTERONE CYPIONATE) 200 MG/ML injection INJECT 1 ML (200 MG TOTAL) INTO THE MUSCLE EVERY 14 (FOURTEEN) DAYS. 2 mL 0  . tiZANidine (ZANAFLEX) 4 MG tablet Take 1.5 tablets (6 mg total) by mouth every 6 (six) hours as needed for muscle spasms. 540 tablet 1  . venlafaxine XR (EFFEXOR-XR) 150 MG 24 hr capsule TAKE 1 CAPSULE (150 MG TOTAL) BY MOUTH DAILY WITH BREAKFAST. 90 capsule 1  . zolpidem (AMBIEN) 5 MG tablet Take 1 tablet (5 mg total) by mouth at bedtime as needed for sleep. 30 tablet 0  . HYDROcodone-acetaminophen (NORCO) 7.5-325 MG tablet Take 1 tablet by mouth every 4 (four) hours as needed for up to 7 days for moderate pain ((score 4 to 6)). 40 tablet 0   Current Facility-Administered Medications on File Prior to Visit  Medication Dose Route Frequency Provider Last Rate Last Admin  . testosterone cypionate (DEPOTESTOSTERONE CYPIONATE) injection 200 mg  200 mg Intramuscular Q14 Days Mackie Pai, PA-C   200 mg at 09/11/20 0951    BP (!) 145/71   Pulse (!) 57    Resp 18   Ht 5\' 10"  (1.778 m)   Wt 168 lb 9.6 oz (76.5 kg)   SpO2 (!) 87%   BMI 24.19 kg/m      Objective:   Physical Exam  General Mental Status- Alert. General Appearance- Not in acute distress.   Skin General: Color- Normal Color. Moisture- Normal Moisture.  Neck Carotid Arteries- Normal color. Moisture- Normal Moisture. No carotid bruits. No JVD.  Chest and Lung Exam Auscultation: Breath Sounds:-Normal. On ambulating down the hall short distance O2 sat dropped to 87% and pulse increased to 122.  3 breaks necessary to rest before returning to room.  Cardiovascular Auscultation:Rythm- Regular. Murmurs & Other Heart Sounds:Auscultation of the heart reveals- No Murmurs.  Abdomen Inspection:-Inspeection Normal. Palpation/Percussion:Note:No mass. Palpation and Percussion of the abdomen reveal- Non Tender, Non Distended + BS, no rebound or guarding.    Neurologic Cranial Nerve exam:- CN III-XII intact(No nystagmus), symmetric smile. Strength:- 5/5 equal and symmetric strength both   upper and lower extremities.-Very thin and calves bilaterally.  No swelling.  Faint mild positive Homans' sign right side.  Left side no tenderness.      Assessment & Plan:  Recent describes severe dyspnea at rest and on exertion.  In office today after short brief walk O2 sats dropped to 87% and pulse increased to 122.  At rest O2 sat 92-93 range.  Some intermittent cough and diaphoresis reported nightly.  Concern for pneumonia.  Also exam mild right popliteal area pain.  Recent episodes of hypotension.  Also remote history of discitis and osteomyelitis.  Still receiving antibiotics through PICC line.  Concerned that you need stat work up in ED. chest x-ray, labs and possible ultrasound imaging of lower extremity would be of benefit.  Defer work-up to ED.  Follow-up date to be determined after ED visit review.  Mackie Pai, PA-C   Time spent with patient today was 41  minutes which  consisted of chart review, discussing diagnosis, work up, treatment and documentation. Discussed case with ED MD.

## 2020-11-10 NOTE — Patient Instructions (Addendum)
Recent describes severe dyspnea at rest and on exertion.  In office today after short brief walk O2 sats dropped to 87% and pulse increased to 122.  At rest O2 sat 92-93 range.  Some intermittent cough and diaphoresis reported nightly.  Concern for pneumonia.  Also exam mild right popliteal area pain.  Recent episodes of hypotension.  Also remote history of discitis and osteomyelitis.  Still receiving antibiotics through PICC line.  Concerned that you need stat work up in ED. chest x-ray, labs and possible ultrasound imaging of lower extremity would be of benefit.  Defer work-up to ED.  Follow-up date to be determined after ED visit review.

## 2020-11-10 NOTE — H&P (Signed)
History and Physical    KHOLE BRANCH OJJ:[id number] DOB: 1945/12/13 DOA: 11/10/2020  PCP: Mackie Pai, PA-C  Patient coming from: Home.  Chief Complaint: Shortness of breath.  HPI: Luis Brown is a 75 y.o. male with history of hypertension, diabetes mellitus type 2, hyperlipidemia who has been on IV antibiotics daptomycin following thoracic spine osteomyelitis being followed by Dr. Tommy Medal infectious disease consultant has been experiencing increasing shortness of breath with diaphoresis of the nighttime with temperatures running around 99 F..  The symptoms patient presented to his primary care physician's office.  At the office patient was found to be easily getting hypoxic on exertion with tachycardia.  Was referred to the ER.  Blood pressure was also reported to be in the low normal.  Patient does complain of low back pain which patient states is chronic.  ED Course: In the ER labs showed leukocytosis of 14,000.  CT abdomen showing consolidation consistent with pneumonia.  COVID test was negative.  Blood cultures were obtained and patient was started on empiric antibiotics for pneumonia.   Review of Systems: As per HPI, rest all negative.   Past Medical History:  Diagnosis Date  . Acute pharyngitis 10/21/2013  . Allergy    grass and pollen  . Anxiety and depression 10/25/2011  . BPH (benign prostatic hyperplasia) 04/23/2012  . Chicken pox as a child  . DDD (degenerative disc disease)    cervical responds to steroid injections and low back required surgery  . DDD (degenerative disc disease), lumbosacral   . Diabetes mellitus    pre  . Dyspnea   . ED (erectile dysfunction) 04/23/2012  . Elevated BP   . 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 Esophageal reflux 02/10/2015  . Fatigue   . HTN (hypertension)   . Hyperglycemia    preDM   . Hyperlipidemia   . Insomnia   . Low back pain radiating to both legs 01/16/2017  . Measles as a child  . Medicare annual wellness visit,  subsequent 02/10/2015  . Overweight(278.02)   . Peripheral neuropathy 08/07/2019  . Personal history of colonic polyps 10/27/2012   Follows with [facility]  . Pneumonia    " walking"  . Preventative health care   . 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  . Wears glasses     Past Surgical History:  Procedure Laterality Date  . BACK SURGERY  2012 and 1994   Dr Margret Chance, screws and cage in low back  . BIOPSY  01/09/2020   Procedure: BIOPSY;  Surgeon: Otis Brace, MD;  Location: [facility];  Service: Gastroenterology;;  . COLONOSCOPY WITH PROPOFOL N/A 01/09/2020   Procedure: COLONOSCOPY WITH PROPOFOL;  Surgeon: Otis Brace, MD;  Location: [facility];  Service: Gastroenterology;  Laterality: N/A;  . ESOPHAGOGASTRODUODENOSCOPY (EGD) WITH PROPOFOL N/A 01/09/2020   Procedure: ESOPHAGOGASTRODUODENOSCOPY (EGD) WITH PROPOFOL;  Surgeon: Otis Brace, MD;  Location: [facility];  Service: Gastroenterology;  Laterality: N/A;  . EYE SURGERY Bilateral    2016  . HARDWARE REVISION  03/12/2019   REVISION OF HARDWARE THORACIC TEN-THORACIC ELEVEN WITH APPLICATION OF ADDITIONAL RODS AND ROD SLEEVES THORACIC EIGHT-THORACIC TWELVE 12-LUMBAR ONE (N/A )  . LAMINECTOMY  02/12/2019   DECOMPRESSIVE LAMINECTOMY THORACIC NINE-THORACIC TEN AND THORACIC TEN-THORACIC ELEVEN, EXTENSION OF THORACOLUMBAR FUSION FROM THORACIC TEN TO THORACIC FIVE, LOCAL BONE GRAFT, ALLOGRAFT AND VIVIGEN (N/A)  . POLYPECTOMY  01/09/2020   Procedure: POLYPECTOMY;  Surgeon: Otis Brace, MD;  Location: [facility];  Service: Gastroenterology;;  . REMOVAL OF RODS AND PEDICLE  SCREWS T5 AND T6, EXPLORATION OF FUSION, BIOPSY TRANSPEDICULAR T5 AND T6 (N/A )  09/14/2020  . SPINAL FUSION N/A 02/12/2019   Procedure: DECOMPRESSIVE LAMINECTOMY THORACIC NINE-THORACIC TEN AND THORACIC TEN-THORACIC ELEVEN, EXTENSION OF THORACOLUMBAR FUSION FROM THORACIC TEN TO THORACIC FIVE, LOCAL BONE GRAFT, ALLOGRAFT AND VIVIGEN;  Surgeon:  Jessy Oto, MD;  Location: [place];  Service: Orthopedics;  Laterality: N/A;  DECOMPRESSIVE LAMINECTOMY THORACIC NINE-THORACIC TEN AND THORACIC TEN-THORACIC ELEVEN, EXTENSION OF TH  . SPINAL FUSION N/A 03/12/2019   Procedure: REVISION OF HARDWARE THORACIC TEN-THORACIC ELEVEN WITH APPLICATION OF ADDITIONAL RODS AND ROD SLEEVES THORACIC EIGHT-THORACIC TWELVE 12-LUMBAR ONE;  Surgeon: Jessy Oto, MD;  Location: [place];  Service: Orthopedics;  Laterality: N/A;  . TONSILLECTOMY    . TONSILLECTOMY     as child  . torn rotator cuff  2010   right     reports that he quit smoking about 31 years ago. His smoking use included cigarettes. He has a 20.00 pack-year smoking history. He has never used smokeless tobacco. He reports previous alcohol use. He reports that he does not use drugs.  No Known Allergies  Family History  Problem Relation Age of Onset  . Hypertension Mother   . Diabetes Mother        type 2  . Cancer Mother 67       breast in remission  . Emphysema Father        smoker  . COPD Father        smoker  . Stroke Father 61       mini  . Heart disease Father   . Hypertension Sister   . Hyperlipidemia Sister   . Scoliosis Sister   . Osteoporosis Sister   . Hypertension Maternal Grandmother   . Scoliosis Maternal Grandmother   . Heart disease Maternal Grandfather   . Heart disease Paternal Grandfather        smoker  . Heart disease Daughter     Prior to Admission medications   Medication Sig Start Date End Date Taking? Authorizing Provider  albuterol (VENTOLIN HFA) 108 (90 Base) MCG/ACT inhaler Inhale 2 puffs into the lungs every 6 (six) hours as needed. 11/02/20   Saguier, Percell Miller, PA-C  atorvastatin (LIPITOR) 10 MG tablet Take 1 tablet (10 mg total) by mouth daily. 10/28/20   Saguier, Percell Miller, PA-C  b complex vitamins capsule Take 1 capsule by mouth daily.    [provider]  BAYER ASPIRIN EC LOW DOSE 81 MG EC tablet Take 81 mg by mouth every morning. 04/06/20    [provider]  busPIRone (BUSPAR) 7.5 MG tablet 1-2 TAB BY MOUTH 3 TIMES A DAY FOR ANXIETY Patient taking differently: Take 7.5 mg by mouth 2 (two) times daily. 12/17/19   Saguier, Percell Miller, PA-C  cetirizine (ZYRTEC) 10 MG tablet Take 10 mg by mouth daily. In the morning    [provider]  famotidine (PEPCID) 40 MG tablet TAKE 1 TABLET BY MOUTH EVERYDAY AT BEDTIME 11/09/20   Saguier, Percell Miller, PA-C  ferrous fumarate (FERRETTS) 325 (106 Fe) MG TABS tablet Take 1 tablet (106 mg of iron total) by mouth daily. 10/28/20   Saguier, Percell Miller, PA-C  glucose blood (CONTOUR NEXT TEST) test strip Check blood sugar once daily 02/25/19   Saguier, Percell Miller, PA-C  HYDROcodone-acetaminophen (NORCO) 7.5-325 MG tablet Take 1 tablet by mouth every 4 (four) hours as needed for up to 7 days for moderate pain ((score 4 to 6)). 09/15/20 09/22/20  [name]  E, MD  JANUVIA 50 MG tablet TAKE 1 TABLET BY MOUTH EVERY DAY Patient taking differently: Take 50 mg by mouth daily. 07/06/20   Saguier, Percell Miller, PA-C  metFORMIN (GLUCOPHAGE) 1000 MG tablet Take 1 tablet (1,000 mg total) by mouth 2 (two) times daily with a meal. 10/26/20   Saguier, Percell Miller, PA-C  olmesartan (BENICAR) 5 MG tablet Take 1 tablet (5 mg total) by mouth daily. 10/23/20   Shelda Pal, DO  pantoprazole (PROTONIX) 40 MG tablet TAKE 1 TABLET BY MOUTH TWICE A DAY BEFORE A MEAL Patient taking differently: Take 40 mg by mouth 2 (two) times daily before a meal. 06/17/20   Saguier, Percell Miller, PA-C  pregabalin (LYRICA) 75 MG capsule TAKE 1 CAPSULE BY MOUTH TWICE A DAY Patient taking differently: Take 75 mg by mouth 2 (two) times daily. 03/09/20   Jessy Oto, MD  sodium polystyrene (KAYEXALATE) 15 GM/60ML suspension 60 ml po q day for 4 days 11/03/20   Saguier, Percell Miller, PA-C  sucralfate (CARAFATE) 1 g tablet Take 1 tablet (1 g total) by mouth 4 (four) times daily -  with meals and at bedtime. 10/05/20   Saguier, Percell Miller, PA-C  tamsulosin (FLOMAX) 0.4 MG CAPS  capsule Take 2 capsules (0.8 mg total) by mouth daily. 05/06/20   Saguier, Percell Miller, PA-C  testosterone cypionate (DEPOTESTOSTERONE CYPIONATE) 200 MG/ML injection INJECT 1 ML (200 MG TOTAL) INTO THE MUSCLE EVERY 14 (FOURTEEN) DAYS. 09/30/20   Saguier, Percell Miller, PA-C  tiZANidine (ZANAFLEX) 4 MG tablet Take 1.5 tablets (6 mg total) by mouth every 6 (six) hours as needed for muscle spasms. 11/04/20   Lanae Crumbly, PA-C  venlafaxine XR (EFFEXOR-XR) 150 MG 24 hr capsule TAKE 1 CAPSULE (150 MG TOTAL) BY MOUTH DAILY WITH BREAKFAST. 06/16/20   Saguier, Percell Miller, PA-C  zolpidem (AMBIEN) 5 MG tablet Take 1 tablet (5 mg total) by mouth at bedtime as needed for sleep. 10/08/20   Saguier, Percell Miller, PA-C    Physical Exam: Constitutional: Moderately built and nourished. Vitals:   11/10/20 1949 11/10/20 2023 11/10/20 2052 11/10/20 2230  BP: (!) 170/79 (!) 157/74  (!) 166/84  Pulse: (!) 103 (!) 108  (!) 110  Resp: 20 (!) 27  20  Temp:  98.6 F (37 C)  99.5 F (37.5 C)  TempSrc:  Oral  Oral  SpO2: 92% 92% 93% 93%  Weight:    75.9 kg  Height:    5\' 10"  (1.778 m)   Eyes: Anicteric no pallor. ENMT: No discharge from the ears eyes nose and mouth. Neck: No mass felt.  No neck rigidity. Respiratory: No rhonchi or crepitations. Cardiovascular: S1-S2 heard. Abdomen: Soft nontender bowel sounds present. Musculoskeletal: No edema. Skin: No rash. Neurologic: Alert awake oriented to time place and person.  Moves all EXTR. Psychiatric: Appears normal.  Normal affect.   Labs on Admission: I have personally reviewed following labs and imaging studies  CBC: Recent Labs  Lab 11/10/20 1629  WBC 14.9*  NEUTROABS 12.3*  HGB 12.6*  HCT 37.8*  MCV 91.1  PLT [id number]*   Basic Metabolic Panel: Recent Labs  Lab 11/10/20 1629  NA 130*  K 3.8  CL 94*  CO2 23  GLUCOSE 136*  BUN 15  CREATININE 0.50*  CALCIUM 8.8*   GFR: Estimated Creatinine Clearance: 79.8 mL/min (A) (by C-G formula based on SCr of 0.5 mg/dL  (L)). Liver Function Tests: No results for input(s): AST, ALT, ALKPHOS, BILITOT, PROT, ALBUMIN in the last 168 hours. No results for input(s): LIPASE,  AMYLASE in the last 168 hours. No results for input(s): AMMONIA in the last 168 hours. Coagulation Profile: No results for input(s): INR, PROTIME in the last 168 hours. Cardiac Enzymes: No results for input(s): CKTOTAL, CKMB, CKMBINDEX, TROPONINI in the last 168 hours. BNP (last 3 results) No results for input(s): PROBNP in the last 8760 hours. HbA1C: No results for input(s): HGBA1C in the last 72 hours. CBG: No results for input(s): GLUCAP in the last 168 hours. Lipid Profile: No results for input(s): CHOL, HDL, LDLCALC, TRIG, CHOLHDL, LDLDIRECT in the last 72 hours. Thyroid Function Tests: No results for input(s): TSH, T4TOTAL, FREET4, T3FREE, THYROIDAB in the last 72 hours. Anemia Panel: No results for input(s): VITAMINB12, FOLATE, FERRITIN, TIBC, IRON, RETICCTPCT in the last 72 hours. Urine analysis:    Component Value Date/Time   COLORURINE YELLOW 01/07/2020 1250   APPEARANCEUR CLEAR 01/07/2020 1250   LABSPEC 1.015 01/07/2020 1250   PHURINE 6.5 01/07/2020 1250   GLUCOSEU NEGATIVE 01/07/2020 1250   HGBUR NEGATIVE 01/07/2020 1250   BILIRUBINUR NEGATIVE 01/07/2020 1250   BILIRUBINUR negative 10/08/2019 1430   KETONESUR 40 (A) 01/07/2020 1250   PROTEINUR NEGATIVE 01/07/2020 1250   UROBILINOGEN negative (A) 10/08/2019 1430   UROBILINOGEN 0.2 04/08/2011 0956   NITRITE NEGATIVE 01/07/2020 1250   LEUKOCYTESUR NEGATIVE 01/07/2020 1250   Sepsis Labs: @LABRCNTIP (procalcitonin:4,lacticidven:4) ) Recent Results (from the past 240 hour(s))  Resp Panel by RT-PCR (Flu A&B, Covid) Nasopharyngeal Swab     Status: None   Collection Time: 11/10/20  4:54 PM   Specimen: Nasopharyngeal Swab; Nasopharyngeal(NP) swabs in vial transport medium  Result Value Ref Range Status   SARS Coronavirus 2 by RT PCR NEGATIVE NEGATIVE Final    Comment:  (NOTE) SARS-CoV-2 target nucleic acids are NOT DETECTED.  The SARS-CoV-2 RNA is generally detectable in upper respiratory specimens during the acute phase of infection. The lowest concentration of SARS-CoV-2 viral copies this assay can detect is 138 copies/mL. A negative result does not preclude SARS-Cov-2 infection and should not be used as the sole basis for treatment or other patient management decisions. A negative result may occur with  improper specimen collection/handling, submission of specimen other than nasopharyngeal swab, presence of viral mutation(s) within the areas targeted by this assay, and inadequate number of viral copies(<138 copies/mL). A negative result must be combined with clinical observations, patient history, and epidemiological information. The expected result is Negative.  Fact Sheet for Patients:  EntrepreneurPulse.com.au  Fact Sheet for Healthcare Providers:  IncredibleEmployment.be  This test is no t yet approved or cleared by the [place] FDA and  has been authorized for detection and/or diagnosis of SARS-CoV-2 by FDA under an Emergency Use Authorization (EUA). This EUA will remain  in effect (meaning this test can be used) for the duration of the COVID-19 declaration under Section 564(b)(1) of the Act, 21 U.S.C.section 360bbb-3(b)(1), unless the authorization is terminated  or revoked sooner.       Influenza A by PCR NEGATIVE NEGATIVE Final   Influenza B by PCR NEGATIVE NEGATIVE Final    Comment: (NOTE) The Xpert Xpress SARS-CoV-2/FLU/RSV plus assay is intended as an aid in the diagnosis of influenza from Nasopharyngeal swab specimens and should not be used as a sole basis for treatment. Nasal washings and aspirates are unacceptable for Xpert Xpress SARS-CoV-2/FLU/RSV testing.  Fact Sheet for Patients: EntrepreneurPulse.com.au  Fact Sheet for Healthcare  Providers: IncredibleEmployment.be  This test is not yet approved or cleared by the [place] and has been authorized  for detection and/or diagnosis of SARS-CoV-2 by FDA under an Emergency Use Authorization (EUA). This EUA will remain in effect (meaning this test can be used) for the duration of the COVID-19 declaration under Section 564(b)(1) of the Act, 21 U.S.C. section 360bbb-3(b)(1), unless the authorization is terminated or revoked.  Performed at [facility], [address] [place], [place] [address]      Radiological Exams on Admission: CT Angio Chest PE W/Cm &/Or Wo Cm  Result Date: 11/10/2020 CLINICAL DATA:  75 year old male with concern for pulmonary embolism. EXAM: CT ANGIOGRAPHY CHEST WITH CONTRAST TECHNIQUE: Multidetector CT imaging of the chest was performed using the standard protocol during bolus administration of intravenous contrast. Multiplanar CT image reconstructions and MIPs were obtained to evaluate the vascular anatomy. CONTRAST:  42mL OMNIPAQUE IOHEXOL 350 MG/ML SOLN COMPARISON:  Chest radiograph dated 11/10/2020 and CT dated 12/25/2019. FINDINGS: Cardiovascular: There is no cardiomegaly or pericardial effusion. There is coronary vascular calcification of the LAD. The thoracic aorta is unremarkable. The origins of the great vessels of the aortic arch appear patent as visualized. Evaluation of the pulmonary arteries is limited due to respiratory motion artifact and streak artifact caused by spinal metallic hardware. No pulmonary artery embolus identified. Mediastinum/Nodes: Right hilar adenopathy measures 15 mm in short axis. Mildly enlarged lymph node anterior to the trachea measures 10 mm short axis. The esophagus and the thyroid gland are grossly unremarkable. No mediastinal fluid collection. Right-sided PICC with tip at the cavoatrial junction. Lungs/Pleura: There is background of mild centrilobular emphysema. Large patchy area of  interstitial coarsening and diffuse ground-glass opacity primarily involving the lower lung zone and peripheral and subpleural lung new since the prior CT and most consistent with pneumonia, likely atypical in etiology. Clinical correlation and follow-up to resolution recommended. Several subpleural ground-glass nodules may have central cavitation and therefore septic emboli are not excluded. A 5 mm left lower lobe nodule appears similar to prior CT. There is a small right pleural effusion. No pneumothorax. The central airways are patent. Upper Abdomen: No acute abnormality. Musculoskeletal: Degenerative changes, osteopenia, and extensive spinal fusion. No acute osseous pathology. Review of the MIP images confirms the above findings. IMPRESSION: 1. No CT evidence of pulmonary embolism. 2. Findings most consistent with pneumonia, likely atypical in etiology. Clinical correlation and follow-up to resolution recommended. 3. Small right pleural effusion. 4. Aortic Atherosclerosis (ICD10-I70.0) and Emphysema (ICD10-J43.9). Electronically Signed   By: Anner Crete M.D.   On: 11/10/2020 18:32   DG Chest Port 1 View  Result Date: 11/10/2020 CLINICAL DATA:  75 year old male with shortness of breath. EXAM: PORTABLE CHEST 1 VIEW COMPARISON:  Chest radiograph dated 01/07/2020. FINDINGS: Right-sided PICC with tip over the central SVC close to the cavoatrial junction. Right-sided pulmonary opacities predominantly involving the peripheral and subpleural lung, new since the prior radiograph, and most consistent with pneumonia and possibly viral or atypical in etiology. Clinical correlation and follow-up to resolution is recommended. The left lung is clear. No pleural effusion or pneumothorax. The cardiac silhouette is within limits. Spinal fusion hardware. No acute osseous pathology. IMPRESSION: Right-sided pulmonary opacities most consistent with pneumonia. Clinical correlation and follow-up to resolution is recommended.  Electronically Signed   By: Anner Crete M.D.   On: 11/10/2020 17:25    EKG: Independently reviewed.  Sinus tachycardia.  Assessment/Plan Principal Problem:   Pneumonia of right lower lobe due to infectious organism Active Problems:   HTN (hypertension)   Diabetes mellitus without complication (HCC)   Hyponatremia  CAP (community acquired pneumonia)    1. Community-acquired pneumonia -patient has been started empirically on vancomycin and cefepime in the ER which we will continue with Zithromax follow blood culture check urine for Legionella strep antigen and sputum culture.  Patient reports feeling. 2. Hypertension on Benicar. 3. Hyperlipidemia on statins. 4. Anemia on iron supplements and B12 supplements. 5. Diabetes mellitus type 2 we will keep patient on sliding scale coverage. 6. Hyponatremia we will closely monitor sodium trends.  Presently on saline.  Check urine sodium and osmolality. 7. Thoracic spine vertebral osteomyelitis being followed by infectious disease consultant.  On PICC line and IV daptomycin presently on vancomycin cefepime and Zithromax.  Follow blood cultures. 8. Chronic pain hydrocodone pregabalin Lyrica.   DVT prophylaxis: Lovenox. Code Status: Full code. Family Communication: Discussed with patient. Disposition Plan: Home. Consults called: None. Admission status: Observation.   Rise Patience MD [facility] Pager [phone].  If 7PM-7AM, please contact night-coverage [email or web address] Password [facility]  11/10/2020, 11:24 PM

## 2020-11-10 NOTE — Plan of Care (Signed)

## 2020-11-10 NOTE — ED Notes (Signed)
Handoff report given to [facility] 

## 2020-11-10 NOTE — ED Provider Notes (Signed)
[place] EMERGENCY DEPARTMENT Provider Note   CSN: [id number] Arrival date & time: 11/10/20  1601     History Chief Complaint  Patient presents with  . Shortness of Breath    Luis Brown is a 76 y.o. male.  75 year old male with past medical history of recent back surgery for failed hardware and discitis (complex history with multiple surgeries and infections), on PICC line antibiotics (dapto), diabetes, presents with complaint of shortness of breath progressively worsening for the past week with cough, night sweats, chills.  Patient was seen by his PCP earlier in the week and given an albuterol inhaler which he states does not seem to help much as he coughs too frequently for it to be effective.  Seen and recheck by PCP today and found to be tachycardic and hypoxic with ambulation with O2 sats to 87% and was sent to the emergency room for further evaluation.  Denies leg pain or swelling.  No other complaints or concerns today.        Past Medical History:  Diagnosis Date  . Acute pharyngitis 10/21/2013  . Allergy    grass and pollen  . Anxiety and depression 10/25/2011  . BPH (benign prostatic hyperplasia) 04/23/2012  . Chicken pox as a child  . DDD (degenerative disc disease)    cervical responds to steroid injections and low back required surgery  . DDD (degenerative disc disease), lumbosacral   . Diabetes mellitus    pre  . Dyspnea   . ED (erectile dysfunction) 04/23/2012  . Elevated BP   . 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 Esophageal reflux 02/10/2015  . Fatigue   . HTN (hypertension)   . Hyperglycemia    preDM   . Hyperlipidemia   . Insomnia   . Low back pain radiating to both legs 01/16/2017  . Measles as a child  . Medicare annual wellness visit, subsequent 02/10/2015  . Overweight(278.02)   . Peripheral neuropathy 08/07/2019  . Personal history of colonic polyps 10/27/2012   Follows with [facility]  . Pneumonia    " walking"  .  Preventative health care   . 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  . Wears glasses     Patient Active Problem List   Diagnosis Date Noted  . History of removal of retained hardware 09/14/2020  . Hyperglycemia 03/22/2020  . Gastric ulcer 01/27/2020  . H/O colonoscopy with polypectomy 01/27/2020  . Protein-calorie malnutrition, severe 01/09/2020  . Weight loss 01/07/2020  . Abdominal pain 12/30/2019  . Nausea and vomiting 12/30/2019  . Weight loss, abnormal 12/30/2019  . Change in bowel habits 12/30/2019  . 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 Medication monitoring encounter 08/26/2019  . Peripheral neuropathy 08/07/2019  . Alcohol abuse 05/09/2019  . Hyponatremia 05/05/2019  . 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 Fusion of spine of thoracolumbar region 03/12/2019  . Status post thoracic spinal fusion 02/12/2019  . 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 Thoracic spinal stenosis 04/17/2017  . Painful orthopaedic hardware ([place]) 04/17/2017  . 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 ([place]) 04/17/2017  . Low back pain radiating to both legs 01/16/2017  . Esophageal reflux 02/10/2015  . Medicare annual wellness visit, subsequent 02/10/2015  . Personal history of colonic polyps 10/27/2012  . 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  . BPH (benign prostatic hyperplasia) 04/23/2012  . ED (erectile dysfunction) 04/23/2012  . Allergic state 10/25/2011  . Anxiety and depression 10/25/2011  .  Asthma   . Diabetes mellitus without complication ([place])   . Hyperlipidemia   . Overweight   . Insomnia   . Fatigue   . Preventative health care   . DDD (degenerative disc disease), lumbosacral   . Thoracic degenerative disc disease   . HTN (hypertension)     Past Surgical History:  Procedure Laterality Date  . BACK SURGERY  2012 and 1994   Dr Margret Chance, screws and cage in low back  . BIOPSY   01/09/2020   Procedure: BIOPSY;  Surgeon: Otis Brace, MD;  Location: [facility];  Service: Gastroenterology;;  . COLONOSCOPY WITH PROPOFOL N/A 01/09/2020   Procedure: COLONOSCOPY WITH PROPOFOL;  Surgeon: Otis Brace, MD;  Location: [facility];  Service: Gastroenterology;  Laterality: N/A;  . ESOPHAGOGASTRODUODENOSCOPY (EGD) WITH PROPOFOL N/A 01/09/2020   Procedure: ESOPHAGOGASTRODUODENOSCOPY (EGD) WITH PROPOFOL;  Surgeon: Otis Brace, MD;  Location: [facility];  Service: Gastroenterology;  Laterality: N/A;  . EYE SURGERY Bilateral    2016  . HARDWARE REVISION  03/12/2019   REVISION OF HARDWARE THORACIC TEN-THORACIC ELEVEN WITH APPLICATION OF ADDITIONAL RODS AND ROD SLEEVES THORACIC EIGHT-THORACIC TWELVE 12-LUMBAR ONE (N/A )  . LAMINECTOMY  02/12/2019   DECOMPRESSIVE LAMINECTOMY THORACIC NINE-THORACIC TEN AND THORACIC TEN-THORACIC ELEVEN, EXTENSION OF THORACOLUMBAR FUSION FROM THORACIC TEN TO THORACIC FIVE, LOCAL BONE GRAFT, ALLOGRAFT AND VIVIGEN (N/A)  . POLYPECTOMY  01/09/2020   Procedure: POLYPECTOMY;  Surgeon: Otis Brace, MD;  Location: [facility];  Service: Gastroenterology;;  . REMOVAL OF RODS AND PEDICLE SCREWS T5 AND T6, EXPLORATION OF FUSION, BIOPSY TRANSPEDICULAR T5 AND T6 (N/A )  09/14/2020  . SPINAL FUSION N/A 02/12/2019   Procedure: DECOMPRESSIVE LAMINECTOMY THORACIC NINE-THORACIC TEN AND THORACIC TEN-THORACIC ELEVEN, EXTENSION OF THORACOLUMBAR FUSION FROM THORACIC TEN TO THORACIC FIVE, LOCAL BONE GRAFT, ALLOGRAFT AND VIVIGEN;  Surgeon: Jessy Oto, MD;  Location: [place];  Service: Orthopedics;  Laterality: N/A;  DECOMPRESSIVE LAMINECTOMY THORACIC NINE-THORACIC TEN AND THORACIC TEN-THORACIC ELEVEN, EXTENSION OF TH  . SPINAL FUSION N/A 03/12/2019   Procedure: REVISION OF HARDWARE THORACIC TEN-THORACIC ELEVEN WITH APPLICATION OF ADDITIONAL RODS AND ROD SLEEVES THORACIC EIGHT-THORACIC TWELVE 12-LUMBAR ONE;  Surgeon: Jessy Oto, MD;  Location: [place];   Service: Orthopedics;  Laterality: N/A;  . TONSILLECTOMY    . TONSILLECTOMY     as child  . torn rotator cuff  2010   right       Family History  Problem Relation Age of Onset  . Hypertension Mother   . Diabetes Mother        type 2  . Cancer Mother 55       breast in remission  . Emphysema Father        smoker  . COPD Father        smoker  . Stroke Father 28       mini  . Heart disease Father   . Hypertension Sister   . Hyperlipidemia Sister   . Scoliosis Sister   . Osteoporosis Sister   . Hypertension Maternal Grandmother   . Scoliosis Maternal Grandmother   . Heart disease Maternal Grandfather   . Heart disease Paternal Grandfather        smoker  . Heart disease Daughter     Social History   Tobacco Use  . Smoking status: Former Smoker    Packs/day: 1.00    Years: 20.00    Pack years: 20.00    Types: Cigarettes    Quit date: 07/18/1989    Years  since quitting: 31.3  . Smokeless tobacco: Never Used  . Tobacco comment: off and on smoking for the 20 yrs  Vaping Use  . Vaping Use: Never used  Substance Use Topics  . Alcohol use: Not Currently    Comment: drink or two a day- mixed drink  . Drug use: No    Home Medications Prior to Admission medications   Medication Sig Start Date End Date Taking? Authorizing Provider  albuterol (VENTOLIN HFA) 108 (90 Base) MCG/ACT inhaler Inhale 2 puffs into the lungs every 6 (six) hours as needed. 11/02/20   Saguier, Percell Miller, PA-C  atorvastatin (LIPITOR) 10 MG tablet Take 1 tablet (10 mg total) by mouth daily. 10/28/20   Saguier, Percell Miller, PA-C  b complex vitamins capsule Take 1 capsule by mouth daily.    [provider]  BAYER ASPIRIN EC LOW DOSE 81 MG EC tablet Take 81 mg by mouth every morning. 04/06/20   [provider]  busPIRone (BUSPAR) 7.5 MG tablet 1-2 TAB BY MOUTH 3 TIMES A DAY FOR ANXIETY Patient taking differently: Take 7.5 mg by mouth 2 (two) times daily. 12/17/19   Saguier, Percell Miller, PA-C  cetirizine  (ZYRTEC) 10 MG tablet Take 10 mg by mouth daily. In the morning    [provider]  famotidine (PEPCID) 40 MG tablet TAKE 1 TABLET BY MOUTH EVERYDAY AT BEDTIME 11/09/20   Saguier, Percell Miller, PA-C  ferrous fumarate (FERRETTS) 325 (106 Fe) MG TABS tablet Take 1 tablet (106 mg of iron total) by mouth daily. 10/28/20   Saguier, Percell Miller, PA-C  glucose blood (CONTOUR NEXT TEST) test strip Check blood sugar once daily 02/25/19   Saguier, Percell Miller, PA-C  HYDROcodone-acetaminophen (NORCO) 7.5-325 MG tablet Take 1 tablet by mouth every 4 (four) hours as needed for up to 7 days for moderate pain ((score 4 to 6)). 09/15/20 09/22/20  Jessy Oto, MD  JANUVIA 50 MG tablet TAKE 1 TABLET BY MOUTH EVERY DAY Patient taking differently: Take 50 mg by mouth daily. 07/06/20   Saguier, Percell Miller, PA-C  metFORMIN (GLUCOPHAGE) 1000 MG tablet Take 1 tablet (1,000 mg total) by mouth 2 (two) times daily with a meal. 10/26/20   Saguier, Percell Miller, PA-C  olmesartan (BENICAR) 5 MG tablet Take 1 tablet (5 mg total) by mouth daily. 10/23/20   Shelda Pal, DO  pantoprazole (PROTONIX) 40 MG tablet TAKE 1 TABLET BY MOUTH TWICE A DAY BEFORE A MEAL Patient taking differently: Take 40 mg by mouth 2 (two) times daily before a meal. 06/17/20   Saguier, Percell Miller, PA-C  pregabalin (LYRICA) 75 MG capsule TAKE 1 CAPSULE BY MOUTH TWICE A DAY Patient taking differently: Take 75 mg by mouth 2 (two) times daily. 03/09/20   Jessy Oto, MD  sodium polystyrene (KAYEXALATE) 15 GM/60ML suspension 60 ml po q day for 4 days 11/03/20   Saguier, Percell Miller, PA-C  sucralfate (CARAFATE) 1 g tablet Take 1 tablet (1 g total) by mouth 4 (four) times daily -  with meals and at bedtime. 10/05/20   Saguier, Percell Miller, PA-C  tamsulosin (FLOMAX) 0.4 MG CAPS capsule Take 2 capsules (0.8 mg total) by mouth daily. 05/06/20   Saguier, Percell Miller, PA-C  testosterone cypionate (DEPOTESTOSTERONE CYPIONATE) 200 MG/ML injection INJECT 1 ML (200 MG TOTAL) INTO THE MUSCLE EVERY 14  (FOURTEEN) DAYS. 09/30/20   Saguier, Percell Miller, PA-C  tiZANidine (ZANAFLEX) 4 MG tablet Take 1.5 tablets (6 mg total) by mouth every 6 (six) hours as needed for muscle spasms. 11/04/20   Lanae Crumbly,  PA-C  venlafaxine XR (EFFEXOR-XR) 150 MG 24 hr capsule TAKE 1 CAPSULE (150 MG TOTAL) BY MOUTH DAILY WITH BREAKFAST. 06/16/20   Saguier, Percell Miller, PA-C  zolpidem (AMBIEN) 5 MG tablet Take 1 tablet (5 mg total) by mouth at bedtime as needed for sleep. 10/08/20   Saguier, Percell Miller, PA-C    Allergies    Patient has no known allergies.  Review of Systems   Review of Systems  Constitutional: Positive for chills and diaphoresis. Negative for fever.  HENT: Negative for congestion and sore throat.   Respiratory: Positive for cough and shortness of breath. Negative for chest tightness and wheezing.   Cardiovascular: Negative for chest pain.  Gastrointestinal: Negative for abdominal pain, nausea and vomiting.  Musculoskeletal: Negative for arthralgias and myalgias.  Skin: Negative for rash and wound.  Allergic/Immunologic: Positive for immunocompromised state.  Neurological: Negative for weakness and headaches.  Psychiatric/Behavioral: Negative for confusion.  All other systems reviewed and are negative.   Physical Exam Updated Vital Signs BP (!) 158/76   Pulse (!) 106   Temp 98.3 F (36.8 C) (Oral)   Resp 20   SpO2 94%   Physical Exam Vitals and nursing note reviewed.  Constitutional:      General: He is not in acute distress.    Appearance: He is well-developed. He is not diaphoretic.  HENT:     Head: Normocephalic and atraumatic.  Cardiovascular:     Rate and Rhythm: Regular rhythm. Tachycardia present.     Pulses: Normal pulses.  Pulmonary:     Effort: Pulmonary effort is normal.     Breath sounds: Examination of the right-middle field reveals decreased breath sounds and rhonchi. Examination of the right-lower field reveals decreased breath sounds and rhonchi. Decreased breath sounds and  rhonchi present. No wheezing.  Abdominal:     Palpations: Abdomen is soft.     Tenderness: There is no abdominal tenderness.  Musculoskeletal:     Cervical back: Neck supple.     Right lower leg: No tenderness. No edema.     Left lower leg: No tenderness. No edema.  Skin:    General: Skin is warm and dry.  Neurological:     Mental Status: He is alert and oriented to person, place, and time.  Psychiatric:        Behavior: Behavior normal.     ED Results / Procedures / Treatments   Labs (all labs ordered are listed, but only abnormal results are displayed) Labs Reviewed  BASIC METABOLIC PANEL - Abnormal; Notable for the following components:      Result Value   Sodium 130 (*)    Chloride 94 (*)    Glucose, Bld 136 (*)    Creatinine, Ser 0.50 (*)    Calcium 8.8 (*)    All other components within normal limits  CBC WITH DIFFERENTIAL/PLATELET - Abnormal; Notable for the following components:   WBC 14.9 (*)    RBC 4.15 (*)    Hemoglobin 12.6 (*)    HCT 37.8 (*)    Platelets 511 (*)    Neutro Abs 12.3 (*)    Lymphs Abs 0.5 (*)    Monocytes Absolute 1.7 (*)    All other components within normal limits  RESP PANEL BY RT-PCR (FLU A&B, COVID) ARPGX2  CULTURE, BLOOD (ROUTINE X 2)  CULTURE, BLOOD (ROUTINE X 2)  BRAIN NATRIURETIC PEPTIDE  LACTIC ACID, PLASMA    EKG None  Radiology CT Angio Chest PE W/Cm &/Or Wo Cm  Result  Date: 11/10/2020 CLINICAL DATA:  75 year old male with concern for pulmonary embolism. EXAM: CT ANGIOGRAPHY CHEST WITH CONTRAST TECHNIQUE: Multidetector CT imaging of the chest was performed using the standard protocol during bolus administration of intravenous contrast. Multiplanar CT image reconstructions and MIPs were obtained to evaluate the vascular anatomy. CONTRAST:  7mL OMNIPAQUE IOHEXOL 350 MG/ML SOLN COMPARISON:  Chest radiograph dated 11/10/2020 and CT dated 12/25/2019. FINDINGS: Cardiovascular: There is no cardiomegaly or pericardial effusion.  There is coronary vascular calcification of the LAD. The thoracic aorta is unremarkable. The origins of the great vessels of the aortic arch appear patent as visualized. Evaluation of the pulmonary arteries is limited due to respiratory motion artifact and streak artifact caused by spinal metallic hardware. No pulmonary artery embolus identified. Mediastinum/Nodes: Right hilar adenopathy measures 15 mm in short axis. Mildly enlarged lymph node anterior to the trachea measures 10 mm short axis. The esophagus and the thyroid gland are grossly unremarkable. No mediastinal fluid collection. Right-sided PICC with tip at the cavoatrial junction. Lungs/Pleura: There is background of mild centrilobular emphysema. Large patchy area of interstitial coarsening and diffuse ground-glass opacity primarily involving the lower lung zone and peripheral and subpleural lung new since the prior CT and most consistent with pneumonia, likely atypical in etiology. Clinical correlation and follow-up to resolution recommended. Several subpleural ground-glass nodules may have central cavitation and therefore septic emboli are not excluded. A 5 mm left lower lobe nodule appears similar to prior CT. There is a small right pleural effusion. No pneumothorax. The central airways are patent. Upper Abdomen: No acute abnormality. Musculoskeletal: Degenerative changes, osteopenia, and extensive spinal fusion. No acute osseous pathology. Review of the MIP images confirms the above findings. IMPRESSION: 1. No CT evidence of pulmonary embolism. 2. Findings most consistent with pneumonia, likely atypical in etiology. Clinical correlation and follow-up to resolution recommended. 3. Small right pleural effusion. 4. Aortic Atherosclerosis (ICD10-I70.0) and Emphysema (ICD10-J43.9). Electronically Signed   By: Anner Crete M.D.   On: 11/10/2020 18:32   DG Chest Port 1 View  Result Date: 11/10/2020 CLINICAL DATA:  75 year old male with shortness of  breath. EXAM: PORTABLE CHEST 1 VIEW COMPARISON:  Chest radiograph dated 01/07/2020. FINDINGS: Right-sided PICC with tip over the central SVC close to the cavoatrial junction. Right-sided pulmonary opacities predominantly involving the peripheral and subpleural lung, new since the prior radiograph, and most consistent with pneumonia and possibly viral or atypical in etiology. Clinical correlation and follow-up to resolution is recommended. The left lung is clear. No pleural effusion or pneumothorax. The cardiac silhouette is within limits. Spinal fusion hardware. No acute osseous pathology. IMPRESSION: Right-sided pulmonary opacities most consistent with pneumonia. Clinical correlation and follow-up to resolution is recommended. Electronically Signed   By: Anner Crete M.D.   On: 11/10/2020 17:25    Procedures .Critical Care Performed by: Tacy Learn, PA-C Authorized by: Tacy Learn, PA-C   Critical care provider statement:    Critical care time (minutes):  45   Critical care was time spent personally by me on the following activities:  Discussions with consultants, evaluation of patient's response to treatment, examination of patient, ordering and performing treatments and interventions, ordering and review of laboratory studies, ordering and review of radiographic studies, pulse oximetry, re-evaluation of patient's condition, obtaining history from patient or surrogate and review of old charts     Medications Ordered in ED Medications  ceFEPIme (MAXIPIME) 2 g in sodium chloride 0.9 % 100 mL IVPB (2 g Intravenous New Bag/Given 11/10/20 1832)  vancomycin (VANCOCIN) 1000 MG powder (has no administration in time range)  vancomycin (VANCOCIN) IVPB 1000 mg/200 mL premix (has no administration in time range)  0.9 %  sodium chloride infusion ( Intravenous New Bag/Given 11/10/20 1831)  sodium chloride 0.9 % bolus 500 mL (has no administration in time range)  albuterol (VENTOLIN HFA) 108 (90  Base) MCG/ACT inhaler 2 puff (2 puffs Inhalation Given 11/10/20 1637)  aerochamber plus with mask device 1 each (1 each Other Given 11/10/20 1637)  HYDROcodone-acetaminophen (NORCO/VICODIN) 5-325 MG per tablet 1 tablet (1 tablet Oral Given 11/10/20 1718)  cyclobenzaprine (FLEXERIL) tablet 10 mg (10 mg Oral Given 11/10/20 1728)  iohexol (OMNIPAQUE) 350 MG/ML injection 75 mL (75 mLs Intravenous Contrast Given 11/10/20 1746)    ED Course  I have reviewed the triage vital signs and the nursing notes.  Pertinent labs & imaging results that were available during my care of the patient were reviewed by me and considered in my medical decision making (see chart for details).  Clinical Course as of 11/10/20 1946  Tue Apr 26, 332  [id number] 75 year old male sent by PCP office for shortness of breath, cough, night sweats, found to have HR in the 120s with O2 sat of 87% with ambulation. On exam, found to have diminished/course right mid to lower lung sounds with corresponding PNA on CXR, sent for CT chest for further evaluation. Labs with WBC 14.9, BMP without significant changes, lactic reassuring at 0.8. BNP normal at 68.6. COVID/flu negative. Patient given albuterol inhaler treatment with spacer, O2 via [place] PRN, does well at rest with sats in the mid to upper 90s without supplemental O2. Started on flutter valve by RT. Case discussed with pharmacy given complex antibiotic history, recommends cefepime and vancomycin, pharmacy to follow during admission Plan is to consult for admission after CTA completed.  [LM]  [id number] CTA is negative for PE, does show extensive right-sided pneumonia although likely atypical in etiology.  Also reports small right pleural effusion as well as several subpleural groundglass nodules which may have central cavitation and therefore septic emboli are not excluded. [LM]  [id number] Hospitalist was paged for consult for admission.  Discussed results and plan of care with patient and wife at  bedside. [LM]  1945 Discussed with Dr. Cyd Silence with [facility] who will accept for admission to [facility]. IV fluids ordered, borderline sepsis criteria. [LM]    Clinical Course User Index [LM] Roque Lias   MDM Rules/Calculators/A&P                          Final Clinical Impression(s) / ED Diagnoses Final diagnoses:  HCAP (healthcare-associated pneumonia)  Hypoxia    Rx / DC Orders ED Discharge Orders    None       Roque Lias 11/10/20 1946    Lucrezia Starch, MD 11/10/20 2242

## 2020-11-10 NOTE — ED Notes (Signed)
Vancomycin 1750 mg started as loading does as per pharmacy

## 2020-11-10 NOTE — ED Notes (Signed)
Pt transferred to [facility] via [facility]. 

## 2020-11-10 NOTE — ED Notes (Signed)
BC X 2 OBTAINED (PICC LINE, LEFT ARM PERIPHERAL) PRIOR TO ABX THERAPY

## 2020-11-11 ENCOUNTER — Inpatient Hospital Stay (HOSPITAL_COMMUNITY): Payer: Medicare Other

## 2020-11-11 ENCOUNTER — Encounter: Payer: Self-pay | Admitting: Medical

## 2020-11-11 ENCOUNTER — Telehealth: Payer: Self-pay | Admitting: Medical

## 2020-11-11 DIAGNOSIS — E119 Type 2 diabetes mellitus without complications: Secondary | ICD-10-CM

## 2020-11-11 DIAGNOSIS — R600 Localized edema: Secondary | ICD-10-CM | POA: Diagnosis not present

## 2020-11-11 DIAGNOSIS — Z981 Arthrodesis status: Secondary | ICD-10-CM | POA: Diagnosis not present

## 2020-11-11 DIAGNOSIS — B957 Other staphylococcus as the cause of diseases classified elsewhere: Secondary | ICD-10-CM

## 2020-11-11 DIAGNOSIS — R079 Chest pain, unspecified: Secondary | ICD-10-CM | POA: Diagnosis not present

## 2020-11-11 DIAGNOSIS — M4635 Infection of intervertebral disc (pyogenic), thoracolumbar region: Secondary | ICD-10-CM | POA: Diagnosis not present

## 2020-11-11 DIAGNOSIS — J189 Pneumonia, unspecified organism: Secondary | ICD-10-CM | POA: Diagnosis not present

## 2020-11-11 DIAGNOSIS — M4644 Discitis, unspecified, thoracic region: Secondary | ICD-10-CM | POA: Diagnosis not present

## 2020-11-11 DIAGNOSIS — M4325 Fusion of spine, thoracolumbar region: Secondary | ICD-10-CM | POA: Diagnosis not present

## 2020-11-11 DIAGNOSIS — Z20822 Contact with and (suspected) exposure to covid-19: Secondary | ICD-10-CM | POA: Diagnosis not present

## 2020-11-11 DIAGNOSIS — T50905A Adverse effect of unspecified drugs, medicaments and biological substances, initial encounter: Secondary | ICD-10-CM | POA: Diagnosis not present

## 2020-11-11 DIAGNOSIS — Z809 Family history of malignant neoplasm, unspecified: Secondary | ICD-10-CM | POA: Diagnosis not present

## 2020-11-11 DIAGNOSIS — Z7982 Long term (current) use of aspirin: Secondary | ICD-10-CM | POA: Diagnosis not present

## 2020-11-11 DIAGNOSIS — Z825 Family history of asthma and other chronic lower respiratory diseases: Secondary | ICD-10-CM | POA: Diagnosis not present

## 2020-11-11 DIAGNOSIS — Z87891 Personal history of nicotine dependence: Secondary | ICD-10-CM | POA: Diagnosis not present

## 2020-11-11 DIAGNOSIS — Z83438 Family history of other disorder of lipoprotein metabolism and other lipidemia: Secondary | ICD-10-CM | POA: Diagnosis not present

## 2020-11-11 DIAGNOSIS — T8149XA Infection following a procedure, other surgical site, initial encounter: Secondary | ICD-10-CM | POA: Diagnosis not present

## 2020-11-11 DIAGNOSIS — J8282 Acute eosinophilic pneumonia: Secondary | ICD-10-CM | POA: Diagnosis present

## 2020-11-11 DIAGNOSIS — M7989 Other specified soft tissue disorders: Secondary | ICD-10-CM | POA: Diagnosis not present

## 2020-11-11 DIAGNOSIS — Z833 Family history of diabetes mellitus: Secondary | ICD-10-CM | POA: Diagnosis not present

## 2020-11-11 DIAGNOSIS — R7881 Bacteremia: Secondary | ICD-10-CM | POA: Diagnosis not present

## 2020-11-11 DIAGNOSIS — R61 Generalized hyperhidrosis: Secondary | ICD-10-CM | POA: Diagnosis not present

## 2020-11-11 DIAGNOSIS — T847XXD Infection and inflammatory reaction due to other internal orthopedic prosthetic devices, implants and grafts, subsequent encounter: Secondary | ICD-10-CM

## 2020-11-11 DIAGNOSIS — G061 Intraspinal abscess and granuloma: Secondary | ICD-10-CM | POA: Diagnosis not present

## 2020-11-11 DIAGNOSIS — J918 Pleural effusion in other conditions classified elsewhere: Secondary | ICD-10-CM | POA: Diagnosis not present

## 2020-11-11 DIAGNOSIS — T82868A Thrombosis of vascular prosthetic devices, implants and grafts, initial encounter: Secondary | ICD-10-CM | POA: Diagnosis not present

## 2020-11-11 DIAGNOSIS — I10 Essential (primary) hypertension: Secondary | ICD-10-CM | POA: Diagnosis not present

## 2020-11-11 DIAGNOSIS — Z7984 Long term (current) use of oral hypoglycemic drugs: Secondary | ICD-10-CM | POA: Diagnosis not present

## 2020-11-11 DIAGNOSIS — F419 Anxiety disorder, unspecified: Secondary | ICD-10-CM | POA: Diagnosis present

## 2020-11-11 DIAGNOSIS — Y929 Unspecified place or not applicable: Secondary | ICD-10-CM | POA: Diagnosis not present

## 2020-11-11 DIAGNOSIS — G062 Extradural and subdural abscess, unspecified: Secondary | ICD-10-CM | POA: Diagnosis not present

## 2020-11-11 DIAGNOSIS — R0602 Shortness of breath: Secondary | ICD-10-CM | POA: Diagnosis not present

## 2020-11-11 DIAGNOSIS — E782 Mixed hyperlipidemia: Secondary | ICD-10-CM | POA: Diagnosis not present

## 2020-11-11 DIAGNOSIS — Z823 Family history of stroke: Secondary | ICD-10-CM | POA: Diagnosis not present

## 2020-11-11 DIAGNOSIS — T8189XA Other complications of procedures, not elsewhere classified, initial encounter: Secondary | ICD-10-CM | POA: Diagnosis not present

## 2020-11-11 DIAGNOSIS — L089 Local infection of the skin and subcutaneous tissue, unspecified: Secondary | ICD-10-CM | POA: Diagnosis not present

## 2020-11-11 DIAGNOSIS — A419 Sepsis, unspecified organism: Secondary | ICD-10-CM | POA: Diagnosis not present

## 2020-11-11 DIAGNOSIS — E1140 Type 2 diabetes mellitus with diabetic neuropathy, unspecified: Secondary | ICD-10-CM | POA: Diagnosis not present

## 2020-11-11 DIAGNOSIS — I82A11 Acute embolism and thrombosis of right axillary vein: Secondary | ICD-10-CM | POA: Diagnosis not present

## 2020-11-11 DIAGNOSIS — E871 Hypo-osmolality and hyponatremia: Secondary | ICD-10-CM | POA: Diagnosis not present

## 2020-11-11 DIAGNOSIS — N400 Enlarged prostate without lower urinary tract symptoms: Secondary | ICD-10-CM | POA: Diagnosis not present

## 2020-11-11 DIAGNOSIS — T148XXA Other injury of unspecified body region, initial encounter: Secondary | ICD-10-CM | POA: Diagnosis not present

## 2020-11-11 DIAGNOSIS — I82621 Acute embolism and thrombosis of deep veins of right upper extremity: Secondary | ICD-10-CM | POA: Diagnosis not present

## 2020-11-11 DIAGNOSIS — Z8262 Family history of osteoporosis: Secondary | ICD-10-CM | POA: Diagnosis not present

## 2020-11-11 DIAGNOSIS — J9601 Acute respiratory failure with hypoxia: Secondary | ICD-10-CM | POA: Diagnosis not present

## 2020-11-11 DIAGNOSIS — R0902 Hypoxemia: Secondary | ICD-10-CM | POA: Diagnosis not present

## 2020-11-11 DIAGNOSIS — J90 Pleural effusion, not elsewhere classified: Secondary | ICD-10-CM | POA: Diagnosis not present

## 2020-11-11 DIAGNOSIS — Y95 Nosocomial condition: Secondary | ICD-10-CM | POA: Diagnosis present

## 2020-11-11 DIAGNOSIS — M4624 Osteomyelitis of vertebra, thoracic region: Secondary | ICD-10-CM | POA: Diagnosis not present

## 2020-11-11 DIAGNOSIS — Z9889 Other specified postprocedural states: Secondary | ICD-10-CM | POA: Diagnosis not present

## 2020-11-11 DIAGNOSIS — J159 Unspecified bacterial pneumonia: Secondary | ICD-10-CM | POA: Diagnosis present

## 2020-11-11 DIAGNOSIS — T888XXD Other specified complications of surgical and medical care, not elsewhere classified, subsequent encounter: Secondary | ICD-10-CM | POA: Diagnosis not present

## 2020-11-11 DIAGNOSIS — Z8249 Family history of ischemic heart disease and other diseases of the circulatory system: Secondary | ICD-10-CM | POA: Diagnosis not present

## 2020-11-11 DIAGNOSIS — Z79899 Other long term (current) drug therapy: Secondary | ICD-10-CM | POA: Diagnosis not present

## 2020-11-11 DIAGNOSIS — Y848 Other medical procedures as the cause of abnormal reaction of the patient, or of later complication, without mention of misadventure at the time of the procedure: Secondary | ICD-10-CM | POA: Diagnosis not present

## 2020-11-11 LAB — CBC
HCT: 39 % (ref 39.0–52.0)
Hemoglobin: 12.6 g/dL — ABNORMAL LOW (ref 13.0–17.0)
MCH: 30.4 pg (ref 26.0–34.0)
MCHC: 32.3 g/dL (ref 30.0–36.0)
MCV: 94 fL (ref 80.0–100.0)
Platelets: 527 10*3/uL — ABNORMAL HIGH (ref 150–400)
RBC: 4.15 MIL/uL — ABNORMAL LOW (ref 4.22–5.81)
RDW: 13.7 % (ref 11.5–15.5)
WBC: 13.6 10*3/uL — ABNORMAL HIGH (ref 4.0–10.5)
nRBC: 0 % (ref 0.0–0.2)

## 2020-11-11 LAB — GLUCOSE, CAPILLARY
Glucose-Capillary: 137 mg/dL — ABNORMAL HIGH (ref 70–99)
Glucose-Capillary: 139 mg/dL — ABNORMAL HIGH (ref 70–99)
Glucose-Capillary: 149 mg/dL — ABNORMAL HIGH (ref 70–99)
Glucose-Capillary: 159 mg/dL — ABNORMAL HIGH (ref 70–99)
Glucose-Capillary: 198 mg/dL — ABNORMAL HIGH (ref 70–99)

## 2020-11-11 LAB — BASIC METABOLIC PANEL
Anion gap: 13 (ref 5–15)
BUN: 14 mg/dL (ref 8–23)
CO2: 24 mmol/L (ref 22–32)
Calcium: 8.8 mg/dL — ABNORMAL LOW (ref 8.9–10.3)
Chloride: 99 mmol/L (ref 98–111)
Creatinine, Ser: 0.72 mg/dL (ref 0.61–1.24)
GFR, Estimated: >60 mL/min (ref >60)
Glucose, Bld: 185 mg/dL — ABNORMAL HIGH (ref 70–99)
Potassium: 3.7 mmol/L (ref 3.5–5.1)
Sodium: 136 mmol/L (ref 135–145)

## 2020-11-11 LAB — PROCALCITONIN: Procalcitonin: 0.1 ng/mL

## 2020-11-11 LAB — ECHOCARDIOGRAM COMPLETE
Area-P 1/2: 5.42 cm2
Height: 70 in
S' Lateral: 2.8 cm
Weight: 2677.27 oz

## 2020-11-11 LAB — STREP PNEUMONIAE URINARY ANTIGEN: Strep Pneumo Urinary Antigen: NEGATIVE

## 2020-11-11 LAB — OSMOLALITY, URINE: Osmolality, Ur: 396 mOsm/kg (ref 300–900)

## 2020-11-11 LAB — SEDIMENTATION RATE: Sed Rate: 47 mm/hr — ABNORMAL HIGH (ref 0–16)

## 2020-11-11 LAB — SODIUM, URINE, RANDOM: Sodium, Ur: 93 mmol/L

## 2020-11-11 MED ORDER — METRONIDAZOLE 500 MG PO TABS
500.0000 mg | ORAL_TABLET | Freq: Three times a day (TID) | ORAL | Status: DC
  Administered 2020-11-11 – 2020-11-17 (×19): 500 mg via ORAL
  Filled 2020-11-11 (×19): qty 1

## 2020-11-11 MED ORDER — IRBESARTAN 75 MG PO TABS
37.5000 mg | ORAL_TABLET | Freq: Every day | ORAL | Status: DC
  Administered 2020-11-11 – 2020-11-23 (×12): 37.5 mg via ORAL
  Filled 2020-11-11 (×14): qty 0.5

## 2020-11-11 MED ORDER — GADOBUTROL 1 MMOL/ML IV SOLN
8.0000 mL | Freq: Once | INTRAVENOUS | Status: AC | PRN
  Administered 2020-11-11: 8 mL via INTRAVENOUS

## 2020-11-11 MED ORDER — ZOLPIDEM TARTRATE 5 MG PO TABS: 5.0000 mg | ORAL_TABLET | Freq: Every evening | ORAL | 0 refills | Status: DC | PRN

## 2020-11-11 MED ORDER — SODIUM CHLORIDE 0.9% FLUSH: 10.0000 mL | INTRAVENOUS | Status: DC | PRN

## 2020-11-11 MED ORDER — CHLORHEXIDINE GLUCONATE CLOTH 2 % EX PADS
6.0000 | MEDICATED_PAD | Freq: Every day | CUTANEOUS | Status: DC
  Administered 2020-11-11 – 2020-11-23 (×12): 6 via TOPICAL

## 2020-11-11 NOTE — Plan of Care (Signed)
  Problem: Education: Goal: Knowledge of General Education information will improve Description Including pain rating scale, medication(s)/side effects and non-pharmacologic comfort measures Outcome: Completed/Met   Problem: Clinical Measurements: Goal: Respiratory complications will improve Outcome: Completed/Met   Problem: Activity: Goal: Risk for activity intolerance will decrease Outcome: Completed/Met   

## 2020-11-11 NOTE — Assessment & Plan Note (Addendum)
-   CAP with higher risk MDRO due to prolonged IV abx (dapto) and recent exposure to healthcare - continue vanc, cefepime. - d/c azithro. Added flagyl 4/27 - ID consulted - CTA read on 4/26 suggests right sided cavitary lesions - follow up blood cultures (NGTD) - MRI T spine shows probable seroma; IR consulted per ID and plan is for aspiration/drainage on 11/13/20 (3cc drained, non infected appearing) - follow up fluid studies from aspiration: no organisms - continue flutter and IS - ambulate Qshift; PT ordered  - patient not improving; placed on O2 on 4/30 and now having worsening pleuritic CP, SOB, cough (anxiety also driving large component of this). Repeat CTA on 5/1 shows worsening right sided PNA, small effusion, no PE - pulmonology consulted 5/2. Patient underwent bronch 5/3. Follow up bronch studies

## 2020-11-11 NOTE — Progress Notes (Addendum)
Progress Note    Luis Brown   [id number]  DOB: 07-18-1946  DOA: 11/10/2020     0  PCP: Elise Benne  CC: SOB  Hospital Course: Luis Brown is a 75 yo male with PMH vertebral OM (was on 6 wk Dapto course PTA), peripheral neuropathy, HLD, HTN, DMII, DDD, BPH, anxiety/depression who presented to the hospital with SOB.  He initially presented to his PCPs office and was found to be hypoxic.  He was referred to the ER.  He underwent work-up with CTA chest which showed a large right sided PNA with ground glass nodules and concern for central cavitation.  He was started on IV antibiotics on admission.  Infectious disease was consulted given prolonged antibiotic course at home and now with atypical pneumonia.  Blood cultures were obtained on admission.  Repeat MRI T-spine and echo were also ordered.   Interval History:  Seen this morning in his room with wife bedside.  He is on room air and resting comfortably in bed.  We reviewed his work-up thus far and plan for ongoing antibiotics for now and further monitoring.  ROS: Constitutional: negative for chills and fevers, Respiratory: positive for cough, Cardiovascular: negative for chest pain and Gastrointestinal: negative for abdominal pain  Assessment & Plan: * Pneumonia of right lower lobe due to infectious organism - CAP with higher risk MDRO due to prolonged IV abx (dapto) and recent exposure to healthcare - continue vanc, cefepime. - d/c azithro. Add flagyl - ID consulted - CTA read on 4/26 suggests right sided cavitary lesions only? Atypical if this is considered embolic (sources would be cardiac vs PICC) - follow up blood cultures; hold off on PICC cx for now but if spikes fever or PIV cultures positive then may need to remove PICC - follow up echo and MRI T- spine  - continue flutter and IS - ambulate qshift  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HCC) - s/p removal of hardware of T5, T6 pedicle screws/rods,  exploration of fusion on 09/14/20 - diagnosed with hardware associated Staph Epi OM. Seen by ID on 09/28/20 (prior to this had also been on a prolonged abx course for prior infection). Plan was: 6 weeks Dapto followed by Bactrim up to 3 months if tolerates - seen by ID this admission again; continue on Vanc for now while in hospital  - repeat MRI T-spine 11/11/20: no epidural abscess. New fluid collection in SQ tissue T3-4 to T6-7 measuring 6 cm, considered a seroma  Sepsis ([place]) - tachycardia, tachypnea, pneumonia source - see CAP  BPH (benign prostatic hyperplasia) - Continue Flomax  Hyperlipidemia - Continue Lipitor  Diabetes mellitus without complication ([place]) - continue SSI and CBG monitoring  - last A1c 6.3% on 09/08/20  HTN (hypertension) - Continue Avapro  Hyponatremia-resolved as of 11/11/2020 - Na 130 on admission; suspected hypovolemia - improved   Old records reviewed in assessment of this patient  Antimicrobials: Azithromycin 11/11/20 x 1 Vancomycin 11/10/2020 >> current Cefepime 11/10/2020 >> current  Flagyl 11/11/2020 >> current  DVT prophylaxis: enoxaparin (LOVENOX) injection 40 mg Start: 11/11/20 1000   Code Status:   Code Status: Full Code Family Communication: wife  Disposition Plan: Status is: Inpatient  Remains inpatient appropriate because:Ongoing diagnostic testing needed not appropriate for outpatient work up, IV treatments appropriate due to intensity of illness or inability to take PO and Inpatient level of care appropriate due to severity of illness   Dispo: The patient is from: Home  Anticipated d/c is to: Home              Patient currently is not medically stable to d/c.   Difficult to place patient No  Risk of unplanned readmission score: Unplanned Admission- Pilot do not use: 19.07   Objective: Blood pressure 114/68, pulse 80, temperature 98.3 F (36.8 C), temperature source Oral, resp. rate 20, height 5\' 10"  (1.778 m), weight  75.9 kg, SpO2 93 %.  Examination: General appearance: alert, cooperative and no distress Head: Normocephalic, without obvious abnormality, atraumatic Eyes: EOMI Lungs: decreased Right sided breath sounds, no wheezing; rhonchi present Heart: regular rate and rhythm and S1, S2 normal Abdomen: normal findings: bowel sounds normal and soft, non-tender Extremities: no edema; RUE PICC in place with no surrounding signs of infection Skin: mobility and turgor normal Neurologic: Grossly normal  Consultants:   ID  Procedures:   n/a  Data Reviewed: I have personally reviewed following labs and imaging studies Results for orders placed or performed during the hospital encounter of 11/10/20 (from the past 24 hour(s))  Basic metabolic panel     Status: Abnormal   Collection Time: 11/10/20  4:29 PM  Result Value Ref Range   Sodium 130 (L) 135 - 145 mmol/L   Potassium 3.8 3.5 - 5.1 mmol/L   Chloride 94 (L) 98 - 111 mmol/L   CO2 23 22 - 32 mmol/L   Glucose, Bld 136 (H) 70 - 99 mg/dL   BUN 15 8 - 23 mg/dL   Creatinine, Ser 0.50 (L) 0.61 - 1.24 mg/dL   Calcium 8.8 (L) 8.9 - 10.3 mg/dL   GFR, Estimated >60 >60 mL/min   Anion gap 13 5 - 15  CBC with Differential     Status: Abnormal   Collection Time: 11/10/20  4:29 PM  Result Value Ref Range   WBC 14.9 (H) 4.0 - 10.5 K/uL   RBC 4.15 (L) 4.22 - 5.81 MIL/uL   Hemoglobin 12.6 (L) 13.0 - 17.0 g/dL   HCT 37.8 (L) 39.0 - 52.0 %   MCV 91.1 80.0 - 100.0 fL   MCH 30.4 26.0 - 34.0 pg   MCHC 33.3 30.0 - 36.0 g/dL   RDW 13.5 11.5 - 15.5 %   Platelets 511 (H) 150 - 400 K/uL   nRBC 0.0 0.0 - 0.2 %   Neutrophils Relative % 84 %   Neutro Abs 12.3 (H) 1.7 - 7.7 K/uL   Lymphocytes Relative 3 %   Lymphs Abs 0.5 (L) 0.7 - 4.0 K/uL   Monocytes Relative 11 %   Monocytes Absolute 1.7 (H) 0.1 - 1.0 K/uL   Eosinophils Relative 2 %   Eosinophils Absolute 0.4 0.0 - 0.5 K/uL   Basophils Relative 0 %   Basophils Absolute 0.0 0.0 - 0.1 K/uL   Immature  Granulocytes 0 %   Abs Immature Granulocytes 0.05 0.00 - 0.07 K/uL  Brain natriuretic peptide     Status: None   Collection Time: 11/10/20  4:54 PM  Result Value Ref Range   B Natriuretic Peptide 68.6 0.0 - 100.0 pg/mL  Resp Panel by RT-PCR (Flu A&B, Covid) Nasopharyngeal Swab     Status: None   Collection Time: 11/10/20  4:54 PM   Specimen: Nasopharyngeal Swab; Nasopharyngeal(NP) swabs in vial transport medium  Result Value Ref Range   SARS Coronavirus 2 by RT PCR NEGATIVE NEGATIVE   Influenza A by PCR NEGATIVE NEGATIVE   Influenza B by PCR NEGATIVE NEGATIVE  Lactic acid, plasma  Status: None   Collection Time: 11/10/20  4:54 PM  Result Value Ref Range   Lactic Acid, Venous 0.8 0.5 - 1.9 mmol/L  Glucose, capillary     Status: Abnormal   Collection Time: 11/11/20 12:16 AM  Result Value Ref Range   Glucose-Capillary 139 (H) 70 - 99 mg/dL  Strep pneumoniae urinary antigen     Status: None   Collection Time: 11/11/20  1:00 AM  Result Value Ref Range   Strep Pneumo Urinary Antigen NEGATIVE NEGATIVE  Sodium, urine, random     Status: None   Collection Time: 11/11/20  1:26 AM  Result Value Ref Range   Sodium, Ur 93 mmol/L  Osmolality, urine     Status: None   Collection Time: 11/11/20  1:26 AM  Result Value Ref Range   Osmolality, Ur 396 300 - 900 mOsm/kg  Basic metabolic panel     Status: Abnormal   Collection Time: 11/11/20  4:24 AM  Result Value Ref Range   Sodium 136 135 - 145 mmol/L   Potassium 3.7 3.5 - 5.1 mmol/L   Chloride 99 98 - 111 mmol/L   CO2 24 22 - 32 mmol/L   Glucose, Bld 185 (H) 70 - 99 mg/dL   BUN 14 8 - 23 mg/dL   Creatinine, Ser 0.72 0.61 - 1.24 mg/dL   Calcium 8.8 (L) 8.9 - 10.3 mg/dL   GFR, Estimated >60 >60 mL/min   Anion gap 13 5 - 15  CBC     Status: Abnormal   Collection Time: 11/11/20  4:24 AM  Result Value Ref Range   WBC 13.6 (H) 4.0 - 10.5 K/uL   RBC 4.15 (L) 4.22 - 5.81 MIL/uL   Hemoglobin 12.6 (L) 13.0 - 17.0 g/dL   HCT 39.0 39.0 - 52.0  %   MCV 94.0 80.0 - 100.0 fL   MCH 30.4 26.0 - 34.0 pg   MCHC 32.3 30.0 - 36.0 g/dL   RDW 13.7 11.5 - 15.5 %   Platelets 527 (H) 150 - 400 K/uL   nRBC 0.0 0.0 - 0.2 %  Procalcitonin - Baseline     Status: None   Collection Time: 11/11/20  4:24 AM  Result Value Ref Range   Procalcitonin 0.10 ng/mL  Glucose, capillary     Status: Abnormal   Collection Time: 11/11/20  7:42 AM  Result Value Ref Range   Glucose-Capillary 159 (H) 70 - 99 mg/dL  Glucose, capillary     Status: Abnormal   Collection Time: 11/11/20 12:04 PM  Result Value Ref Range   Glucose-Capillary 149 (H) 70 - 99 mg/dL   *Note: Due to a large number of results and/or encounters for the requested time period, some results have not been displayed. A complete set of results can be found in Results Review.    Recent Results (from the past 240 hour(s))  Resp Panel by RT-PCR (Flu A&B, Covid) Nasopharyngeal Swab     Status: None   Collection Time: 11/10/20  4:54 PM   Specimen: Nasopharyngeal Swab; Nasopharyngeal(NP) swabs in vial transport medium  Result Value Ref Range Status   SARS Coronavirus 2 by RT PCR NEGATIVE NEGATIVE Final    Comment: (NOTE) SARS-CoV-2 target nucleic acids are NOT DETECTED.  The SARS-CoV-2 RNA is generally detectable in upper respiratory specimens during the acute phase of infection. The lowest concentration of SARS-CoV-2 viral copies this assay can detect is 138 copies/mL. A negative result does not preclude SARS-Cov-2 infection and should not be used  as the sole basis for treatment or other patient management decisions. A negative result may occur with  improper specimen collection/handling, submission of specimen other than nasopharyngeal swab, presence of viral mutation(s) within the areas targeted by this assay, and inadequate number of viral copies(<138 copies/mL). A negative result must be combined with clinical observations, patient history, and epidemiological information. The expected  result is Negative.  Fact Sheet for Patients:  EntrepreneurPulse.com.au  Fact Sheet for Healthcare Providers:  IncredibleEmployment.be  This test is no t yet approved or cleared by the [place] FDA and  has been authorized for detection and/or diagnosis of SARS-CoV-2 by FDA under an Emergency Use Authorization (EUA). This EUA will remain  in effect (meaning this test can be used) for the duration of the COVID-19 declaration under Section 564(b)(1) of the Act, 21 U.S.C.section 360bbb-3(b)(1), unless the authorization is terminated  or revoked sooner.       Influenza A by PCR NEGATIVE NEGATIVE Final   Influenza B by PCR NEGATIVE NEGATIVE Final    Comment: (NOTE) The Xpert Xpress SARS-CoV-2/FLU/RSV plus assay is intended as an aid in the diagnosis of influenza from Nasopharyngeal swab specimens and should not be used as a sole basis for treatment. Nasal washings and aspirates are unacceptable for Xpert Xpress SARS-CoV-2/FLU/RSV testing.  Fact Sheet for Patients: EntrepreneurPulse.com.au  Fact Sheet for Healthcare Providers: IncredibleEmployment.be  This test is not yet approved or cleared by the [place] FDA and has been authorized for detection and/or diagnosis of SARS-CoV-2 by FDA under an Emergency Use Authorization (EUA). This EUA will remain in effect (meaning this test can be used) for the duration of the COVID-19 declaration under Section 564(b)(1) of the Act, 21 U.S.C. section 360bbb-3(b)(1), unless the authorization is terminated or revoked.  Performed at [facility], [address], [place],  [address]      Radiology Studies: CT Angio Chest PE W/Cm &/Or Wo Cm  Result Date: 11/10/2020 CLINICAL DATA:  76 year old male with concern for pulmonary embolism. EXAM: CT ANGIOGRAPHY CHEST WITH CONTRAST TECHNIQUE: Multidetector CT imaging of the chest was performed using the  standard protocol during bolus administration of intravenous contrast. Multiplanar CT image reconstructions and MIPs were obtained to evaluate the vascular anatomy. CONTRAST:  35mL OMNIPAQUE IOHEXOL 350 MG/ML SOLN COMPARISON:  Chest radiograph dated 11/10/2020 and CT dated 12/25/2019. FINDINGS: Cardiovascular: There is no cardiomegaly or pericardial effusion. There is coronary vascular calcification of the LAD. The thoracic aorta is unremarkable. The origins of the great vessels of the aortic arch appear patent as visualized. Evaluation of the pulmonary arteries is limited due to respiratory motion artifact and streak artifact caused by spinal metallic hardware. No pulmonary artery embolus identified. Mediastinum/Nodes: Right hilar adenopathy measures 15 mm in short axis. Mildly enlarged lymph node anterior to the trachea measures 10 mm short axis. The esophagus and the thyroid gland are grossly unremarkable. No mediastinal fluid collection. Right-sided PICC with tip at the cavoatrial junction. Lungs/Pleura: There is background of mild centrilobular emphysema. Large patchy area of interstitial coarsening and diffuse ground-glass opacity primarily involving the lower lung zone and peripheral and subpleural lung new since the prior CT and most consistent with pneumonia, likely atypical in etiology. Clinical correlation and follow-up to resolution recommended. Several subpleural ground-glass nodules may have central cavitation and therefore septic emboli are not excluded. A 5 mm left lower lobe nodule appears similar to prior CT. There is a small right pleural effusion. No pneumothorax. The central airways are patent.  Upper Abdomen: No acute abnormality. Musculoskeletal: Degenerative changes, osteopenia, and extensive spinal fusion. No acute osseous pathology. Review of the MIP images confirms the above findings. IMPRESSION: 1. No CT evidence of pulmonary embolism. 2. Findings most consistent with pneumonia, likely  atypical in etiology. Clinical correlation and follow-up to resolution recommended. 3. Small right pleural effusion. 4. Aortic Atherosclerosis (ICD10-I70.0) and Emphysema (ICD10-J43.9). Electronically Signed   By: Anner Crete M.D.   On: 11/10/2020 18:32   MR THORACIC SPINE W WO CONTRAST  Result Date: 11/11/2020 CLINICAL DATA:  Epidural abscess. EXAM: MRI THORACIC WITHOUT AND WITH CONTRAST TECHNIQUE: Multiplanar and multiecho pulse sequences of the thoracic spine were obtained without and with intravenous contrast. CONTRAST:  43mL GADAVIST GADOBUTROL 1 MMOL/ML IV SOLN COMPARISON:  MRI of the thoracic spine April 23, 2020 FINDINGS: Alignment:  Maintained. Vertebrae: Postsurgical changes from posterior fixation from T7 through the visualized upper lumbar spine with interval removal of T5 and T6 posterior hardware. Susceptibility artifact from the hardware limits evaluation, particularly in the lower thoracic region. Endplate destructive changes at T4-5 appear stable with improved marrow edema. Stable appearance of the T9-10 effusion. No new focus suspicious for discitis/osteomyelitis. No evidence of an epidural abscess. Cord: Evaluation limited by susceptibility artifact. No obvious cord lesion. Paraspinal and other soft tissues: New fluid collection in the subcutaneous from the T3-4 through the T6-7 level measuring up to 6 cm in length by 2.3 x 5.9 largest transverse and anteroposterior dimensions, likely related to seroma. Extensive right lung consolidation better described on recent chest CT. Disc levels: No high-grade spinal canal stenosis from T1-2 through T9-10. T10-11 and T11-12 levels are obscured by susceptibility artifact. No neural foraminal narrowing from T1-2 through T7-8. Evaluation of the neural foramen below T7-8 is limited by susceptibility artifact. IMPRESSION: 1. Postsurgical changes from posterior fixation from T7 through the visualized upper lumbar spine with interval removal of T5 and T6  posterior hardware. Susceptibility artifact from the hardware limits evaluation, particularly in the lower thoracic region. 2. Endplate destructive changes at T4-5 appear stable with improved marrow edema. Stable appearance of the T9-10 effusion. No evidence of an epidural abscess. 3. New fluid collection in the subcutaneous from the T3-4 through the T6-7 level measuring up to 6 cm in length by 2.3 x 5.9 cm, likely a seroma. 4. Extensive right lung consolidation better described on recent chest CT. Electronically Signed   By: Pedro Earls M.D.   On: 11/11/2020 13:47   DG Chest Port 1 View  Result Date: 11/10/2020 CLINICAL DATA:  75 year old male with shortness of breath. EXAM: PORTABLE CHEST 1 VIEW COMPARISON:  Chest radiograph dated 01/07/2020. FINDINGS: Right-sided PICC with tip over the central SVC close to the cavoatrial junction. Right-sided pulmonary opacities predominantly involving the peripheral and subpleural lung, new since the prior radiograph, and most consistent with pneumonia and possibly viral or atypical in etiology. Clinical correlation and follow-up to resolution is recommended. The left lung is clear. No pleural effusion or pneumothorax. The cardiac silhouette is within limits. Spinal fusion hardware. No acute osseous pathology. IMPRESSION: Right-sided pulmonary opacities most consistent with pneumonia. Clinical correlation and follow-up to resolution is recommended. Electronically Signed   By: Anner Crete M.D.   On: 11/10/2020 17:25   MR THORACIC SPINE W WO CONTRAST  Final Result    CT Angio Chest PE W/Cm &/Or Wo Cm  Final Result    DG Chest [facility] 1 View  Final Result      Scheduled Meds: .  atorvastatin  10 mg Oral Daily  . busPIRone  7.5 mg Oral BID  . Chlorhexidine Gluconate Cloth  6 each Topical Daily  . enoxaparin (LOVENOX) injection  40 mg Subcutaneous Q24H  . famotidine  20 mg Oral Daily  . Ferrous Fumarate  1 tablet Oral Daily  . insulin aspart   0-9 Units Subcutaneous TID WC  . irbesartan  37.5 mg Oral Daily  . metroNIDAZOLE  500 mg Oral Q8H  . pantoprazole  40 mg Oral BID AC  . pregabalin  75 mg Oral BID  . sucralfate  1 g Oral TID WC & HS  . tamsulosin  0.8 mg Oral Daily  . venlafaxine XR  150 mg Oral Q breakfast   PRN Meds: albuterol, HYDROcodone-acetaminophen, ipratropium-albuterol, tiZANidine, zolpidem Continuous Infusions: . sodium chloride 100 mL/hr at 11/11/20 1453  . ceFEPime (MAXIPIME) IV 2 g (11/11/20 1042)  . vancomycin 1,000 mg (11/11/20 0805)     LOS: 0 days  Time spent: Greater than 50% of the 35 minute visit was spent in counseling/coordination of care for the patient as laid out in the A&P.   Dwyane Dee, MD [facility] 11/11/2020, 3:56 PM

## 2020-11-11 NOTE — Assessment & Plan Note (Signed)
-   continue SSI and CBG monitoring  - last A1c 6.3% on 09/08/20

## 2020-11-11 NOTE — Assessment & Plan Note (Signed)
-   tachycardia, tachypnea, pneumonia source - see CAP

## 2020-11-11 NOTE — Consult Note (Signed)
Date of Admission:  11/10/2020          Reason for Consult: Right sided pneumonia With ground glass areas and some areas of cavitation    Referring Provider: Dr. Sabino Gasser   Assessment:  1. Right sided pneumonia, radiology query possibility of septic emboli but this would be unusual to be unilateral + 2. Pleural effusion 3. 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 Chronic back pain 5. DM 6. BMP  Plan:  1. Continue vancomycin, cefepime and add flagyl for anaerobic coverage 2. follow up blood cultures 3. TTE 4. Check ESR, CRP 5. MRI of T spine w and without gadolinium 6. followup CXR to evaluate effusion  Principal Problem:   Pneumonia of right lower lobe due to infectious organism Active Problems:   HTN (hypertension)   Diabetes mellitus without complication ([place])   Hyponatremia   CAP (community acquired pneumonia)   Scheduled Meds: . atorvastatin  10 mg Oral Daily  . busPIRone  7.5 mg Oral BID  . Chlorhexidine Gluconate Cloth  6 each Topical Daily  . enoxaparin (LOVENOX) injection  40 mg Subcutaneous Q24H  . famotidine  20 mg Oral Daily  . Ferrous Fumarate  1 tablet Oral Daily  . insulin aspart  0-9 Units Subcutaneous TID WC  . irbesartan  37.5 mg Oral Daily  . metroNIDAZOLE  500 mg Oral Q8H  . pantoprazole  40 mg Oral BID AC  . pregabalin  75 mg Oral BID  . sucralfate  1 g Oral TID WC & HS  . tamsulosin  0.8 mg Oral Daily  . venlafaxine XR  150 mg Oral Q breakfast   Continuous Infusions: . sodium chloride 100 mL/hr at 11/11/20 0100  . ceFEPime (MAXIPIME) IV 2 g (11/11/20 1042)  . vancomycin 1,000 mg (11/11/20 0805)   PRN Meds:.albuterol, HYDROcodone-acetaminophen, ipratropium-albuterol, tiZANidine, zolpidem  HPI: Luis Brown is a 75 y.o. male well known to me from my having followed him for P  acnes hardware associated T spine infection with P acnes sp several courses of IV antibiotics, oral doxycyline that was stopped a few months ago who was found to have loosening of screws by Dr Louanne Skye but no purulence and culture now positive for Staph epidermidis. He was placed on oral bactrim and seen by my partner Dr. Gale Journey who started him on IV daptomycin.  Patient is nearing completion of daptomycin.  However in the past week he developed fever, cough, and drenching night sweats causing him to wear an extra shirt at night. He was seen by PCP and found to be hypoxic and sent to ER. In the ER had leukocytosis CXR and CT scan showing right sided pneumonia with pleural effusion  He was started on vancomycin, cefepime and azithromycin after blood cultures were taken  Radiology felt much of the opacities had ground glass apperance and that there might be subpleural areas of cavitation. I have personally reviewed films and find some areas that could be with cavitation. However the entirety of his pathology is in the right lung and nothing in the left. If he had right sided endocarditis with septic emboli to the lungs.  We will dc azithromycin as this does not seem to me like a legionella or atypical infection and go to vanco, cefepime and flagyl.  We will get TTE though I doubt endocarditis.  His back  pain is NOT improved which has been a fairly typical pattern for him.  I will get an MRI of T spine while he is here to re-evaluate this. I will check ESR and CRP --though his PNA might raise these.     Review of Systems: Review of Systems  Constitutional: Positive for chills, diaphoresis, fever and malaise/fatigue. Negative for weight loss.  HENT: Negative for congestion, hearing loss, sore throat and tinnitus.   Eyes: Negative for blurred vision and double vision.  Respiratory: Positive for cough and shortness of breath. Negative for sputum production and wheezing.   Cardiovascular: Negative for  chest pain, palpitations and leg swelling.  Gastrointestinal: Negative for abdominal pain, blood in stool, constipation, diarrhea, heartburn, melena, nausea and vomiting.  Genitourinary: Negative for dysuria, flank pain and hematuria.  Musculoskeletal: Positive for back pain and myalgias. Negative for falls and joint pain.  Skin: Negative for itching and rash.  Neurological: Negative for dizziness, sensory change, focal weakness, loss of consciousness, weakness and headaches.  Endo/Heme/Allergies: Does not bruise/bleed easily.  Psychiatric/Behavioral: Negative for depression, memory loss and suicidal ideas. The patient is not nervous/anxious.     Past Medical History:  Diagnosis Date  . Acute pharyngitis 10/21/2013  . Allergy    grass and pollen  . Anxiety and depression 10/25/2011  . BPH (benign prostatic hyperplasia) 04/23/2012  . Chicken pox as a child  . DDD (degenerative disc disease)    cervical responds to steroid injections and low back required surgery  . DDD (degenerative disc disease), lumbosacral   . Diabetes mellitus    pre  . Dyspnea   . ED (erectile dysfunction) 04/23/2012  . Elevated BP   . 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 Esophageal reflux 02/10/2015  . Fatigue   . HTN (hypertension)   . Hyperglycemia    preDM   . Hyperlipidemia   . Insomnia   . Low back pain radiating to both legs 01/16/2017  . Measles as a child  . Medicare annual wellness visit, subsequent 02/10/2015  . Overweight(278.02)   . Peripheral neuropathy 08/07/2019  . Personal history of colonic polyps 10/27/2012   Follows with [facility]  . Pneumonia    " walking"  . Preventative health care   . 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  . Wears glasses     Social History   Tobacco Use  . Smoking status: Former Smoker    Packs/day: 1.00    Years: 20.00    Pack years: 20.00    Types: Cigarettes    Quit date: 07/18/1989    Years since quitting: 31.3  . Smokeless tobacco: Never Used  . Tobacco  comment: off and on smoking for the 20 yrs  Vaping Use  . Vaping Use: Never used  Substance Use Topics  . Alcohol use: Not Currently    Comment: drink or two a day- mixed drink  . Drug use: No    Family History  Problem Relation Age of Onset  . Hypertension Mother   . Diabetes Mother        type 2  . Cancer Mother 54       breast in remission  . Emphysema Father        smoker  . COPD Father        smoker  . Stroke Father 28       mini  . Heart disease Father   . Hypertension Sister   . Hyperlipidemia Sister   . Scoliosis Sister   . Osteoporosis  Sister   . Hypertension Maternal Grandmother   . Scoliosis Maternal Grandmother   . Heart disease Maternal Grandfather   . Heart disease Paternal Grandfather        smoker  . Heart disease Daughter    No Known Allergies  OBJECTIVE: Blood pressure 114/68, pulse 80, temperature 98.3 F (36.8 C), temperature source Oral, resp. rate 20, height $RemoveBe'5\' 10"'IsMVwndGl$  (1.778 m), weight 75.9 kg, SpO2 93 %.  Physical Exam Constitutional:      Appearance: He is well-developed.  HENT:     Head: Normocephalic and atraumatic.  Eyes:     Conjunctiva/sclera: Conjunctivae normal.  Cardiovascular:     Rate and Rhythm: Normal rate and regular rhythm.     Heart sounds: No murmur heard. No friction rub. No gallop.   Pulmonary:     Effort: Pulmonary effort is normal. No accessory muscle usage or respiratory distress.     Breath sounds: Examination of the right-lower field reveals rhonchi. Rhonchi present. No wheezing.  Abdominal:     General: There is no distension.     Palpations: Abdomen is soft. There is no mass.     Tenderness: There is no abdominal tenderness.     Hernia: No hernia is present.  Musculoskeletal:        General: No tenderness. Normal range of motion.     Cervical back: Normal range of motion and neck supple.  Skin:    General: Skin is warm and dry.     Coloration: Skin is not pale.     Findings: No erythema or rash.   Neurological:     General: No focal deficit present.     Mental Status: He is alert and oriented to person, place, and time.  Psychiatric:        Mood and Affect: Mood normal.        Behavior: Behavior normal.        Thought Content: Thought content normal.        Judgment: Judgment normal.    PICC site 11/11/2020:      Lab Results Lab Results  Component Value Date   WBC 13.6 (H) 11/11/2020   HGB 12.6 (L) 11/11/2020   HCT 39.0 11/11/2020   MCV 94.0 11/11/2020   PLT 527 (H) 11/11/2020    Lab Results  Component Value Date   CREATININE 0.72 11/11/2020   BUN 14 11/11/2020   NA 136 11/11/2020   K 3.7 11/11/2020   CL 99 11/11/2020   CO2 24 11/11/2020    Lab Results  Component Value Date   ALT 8 11/02/2020   AST 14 11/02/2020   ALKPHOS 95 11/02/2020   BILITOT 0.6 11/02/2020     Microbiology: Recent Results (from the past 240 hour(s))  Resp Panel by RT-PCR (Flu A&B, Covid) Nasopharyngeal Swab     Status: None   Collection Time: 11/10/20  4:54 PM   Specimen: Nasopharyngeal Swab; Nasopharyngeal(NP) swabs in vial transport medium  Result Value Ref Range Status   SARS Coronavirus 2 by RT PCR NEGATIVE NEGATIVE Final    Comment: (NOTE) SARS-CoV-2 target nucleic acids are NOT DETECTED.  The SARS-CoV-2 RNA is generally detectable in upper respiratory specimens during the acute phase of infection. The lowest concentration of SARS-CoV-2 viral copies this assay can detect is 138 copies/mL. A negative result does not preclude SARS-Cov-2 infection and should not be used as the sole basis for treatment or other patient management decisions. A negative result may occur with  improper specimen  collection/handling, submission of specimen other than nasopharyngeal swab, presence of viral mutation(s) within the areas targeted by this assay, and inadequate number of viral copies(<138 copies/mL). A negative result must be combined with clinical observations, patient history, and  epidemiological information. The expected result is Negative.  Fact Sheet for Patients:  EntrepreneurPulse.com.au  Fact Sheet for Healthcare Providers:  IncredibleEmployment.be  This test is no t yet approved or cleared by the [place] FDA and  has been authorized for detection and/or diagnosis of SARS-CoV-2 by FDA under an Emergency Use Authorization (EUA). This EUA will remain  in effect (meaning this test can be used) for the duration of the COVID-19 declaration under Section 564(b)(1) of the Act, 21 U.S.C.section 360bbb-3(b)(1), unless the authorization is terminated  or revoked sooner.       Influenza A by PCR NEGATIVE NEGATIVE Final   Influenza B by PCR NEGATIVE NEGATIVE Final    Comment: (NOTE) The Xpert Xpress SARS-CoV-2/FLU/RSV plus assay is intended as an aid in the diagnosis of influenza from Nasopharyngeal swab specimens and should not be used as a sole basis for treatment. Nasal washings and aspirates are unacceptable for Xpert Xpress SARS-CoV-2/FLU/RSV testing.  Fact Sheet for Patients: EntrepreneurPulse.com.au  Fact Sheet for Healthcare Providers: IncredibleEmployment.be  This test is not yet approved or cleared by the [place] FDA and has been authorized for detection and/or diagnosis of SARS-CoV-2 by FDA under an Emergency Use Authorization (EUA). This EUA will remain in effect (meaning this test can be used) for the duration of the COVID-19 declaration under Section 564(b)(1) of the Act, 21 U.S.C. section 360bbb-3(b)(1), unless the authorization is terminated or revoked.  Performed at [facility], [address] [place], [place] [address]     Delos Luis Brown, [place] [facility] [phone] pager  11/11/2020, 11:01 AM

## 2020-11-11 NOTE — Assessment & Plan Note (Signed)
-   Na 130 on admission; suspected hypovolemia - improved

## 2020-11-11 NOTE — Telephone Encounter (Signed)
Refill of Ambien sent to patient's pharmacy.

## 2020-11-11 NOTE — Assessment & Plan Note (Addendum)
-   s/p removal of hardware of T5, T6 pedicle screws/rods, exploration of fusion on 09/14/20 - diagnosed with hardware associated Staph Epi OM. Seen by ID on 09/28/20 (prior to this had also been on a prolonged abx course for prior infection). Plan was: 6 weeks Dapto followed by Bactrim up to 3 months if tolerates - seen by ID this admission again; continue on Vanc for now while in hospital  - repeat MRI T-spine 11/11/20: no epidural abscess. New fluid collection in SQ tissue T3-4 to T6-7 measuring 6 cm, considered a seroma (to be aspirated for culture on 4/29) -Will transition vancomycin to Bactrim once pneumonia further treated

## 2020-11-11 NOTE — Hospital Course (Addendum)
Mr. 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  He initially presented to his PCPs office and was found to be hypoxic.  He was referred to the ER.  He underwent work-up with CTA chest which showed a large right sided PNA with ground glass nodules and concern for central cavitation.  He was started on IV antibiotics on admission.  Infectious disease was consulted given prolonged antibiotic course at home and now with atypical pneumonia.  Blood cultures were obtained on admission.  Repeat MRI T-spine and echo were also ordered. The echo was negative for valvular vegetation.  MRI was negative for deep infection but did reveal a subcutaneous probable seroma.  IR was consulted per ID for aspiration/drainage and culturing of fluid.  He underwent aspiration on 11/13/2020.  Fluid studies were sent for culture and did not grow (appearance was also non-infectious).  He had some swelling and irritation associated with his PICC line.  He underwent right upper extremity duplex which was positive for DVT.  He was started on heparin drip on 11/16/2020 and PICC line was removed. Due to worsening respiratory symptoms, increasing oxygen requirements, he underwent repeat CTA chest.  This showed worsening right-sided pneumonia.  Pulmonology was consulted and he also underwent bronchoscopy on 11/17/2020.

## 2020-11-11 NOTE — Progress Notes (Signed)
  Echocardiogram 2D Echocardiogram has been performed.  Jennette Dubin 11/11/2020, 2:37 PM

## 2020-11-11 NOTE — Assessment & Plan Note (Signed)
-  Continue Lipitor °

## 2020-11-11 NOTE — Progress Notes (Signed)
Received order to draw Blood cultures from PICC line - secure chat with Dr Sabino Gasser & he said   to hold off on this order for now - awaiting blood culture results from 4/26 drawn at [place]

## 2020-11-11 NOTE — Assessment & Plan Note (Signed)
-   Continue Flomax 

## 2020-11-11 NOTE — Assessment & Plan Note (Signed)
-   Continue Avapro

## 2020-11-12 DIAGNOSIS — R61 Generalized hyperhidrosis: Secondary | ICD-10-CM

## 2020-11-12 DIAGNOSIS — A419 Sepsis, unspecified organism: Secondary | ICD-10-CM | POA: Diagnosis not present

## 2020-11-12 DIAGNOSIS — M4325 Fusion of spine, thoracolumbar region: Secondary | ICD-10-CM | POA: Diagnosis not present

## 2020-11-12 DIAGNOSIS — B957 Other staphylococcus as the cause of diseases classified elsewhere: Secondary | ICD-10-CM | POA: Diagnosis not present

## 2020-11-12 DIAGNOSIS — M4624 Osteomyelitis of vertebra, thoracic region: Secondary | ICD-10-CM

## 2020-11-12 DIAGNOSIS — J90 Pleural effusion, not elsewhere classified: Secondary | ICD-10-CM | POA: Diagnosis not present

## 2020-11-12 DIAGNOSIS — J189 Pneumonia, unspecified organism: Secondary | ICD-10-CM | POA: Diagnosis not present

## 2020-11-12 DIAGNOSIS — M4635 Infection of intervertebral disc (pyogenic), thoracolumbar region: Secondary | ICD-10-CM | POA: Diagnosis not present

## 2020-11-12 DIAGNOSIS — T847XXD Infection and inflammatory reaction due to other internal orthopedic prosthetic devices, implants and grafts, subsequent encounter: Secondary | ICD-10-CM | POA: Diagnosis not present

## 2020-11-12 DIAGNOSIS — G062 Extradural and subdural abscess, unspecified: Secondary | ICD-10-CM | POA: Diagnosis not present

## 2020-11-12 LAB — CBC WITH DIFFERENTIAL/PLATELET
Abs Immature Granulocytes: 0.06 10*3/uL (ref 0.00–0.07)
Basophils Absolute: 0 10*3/uL (ref 0.0–0.1)
Basophils Relative: 0 %
Eosinophils Absolute: 0.4 10*3/uL (ref 0.0–0.5)
Eosinophils Relative: 3 %
HCT: 34 % — ABNORMAL LOW (ref 39.0–52.0)
Hemoglobin: 11.2 g/dL — ABNORMAL LOW (ref 13.0–17.0)
Immature Granulocytes: 1 %
Lymphocytes Relative: 3 %
Lymphs Abs: 0.3 10*3/uL — ABNORMAL LOW (ref 0.7–4.0)
MCH: 30.4 pg (ref 26.0–34.0)
MCHC: 32.9 g/dL (ref 30.0–36.0)
MCV: 92.4 fL (ref 80.0–100.0)
Monocytes Absolute: 1.3 10*3/uL — ABNORMAL HIGH (ref 0.1–1.0)
Monocytes Relative: 10 %
Neutro Abs: 10.9 10*3/uL — ABNORMAL HIGH (ref 1.7–7.7)
Neutrophils Relative %: 83 %
Platelets: 439 10*3/uL — ABNORMAL HIGH (ref 150–400)
RBC: 3.68 MIL/uL — ABNORMAL LOW (ref 4.22–5.81)
RDW: 13.6 % (ref 11.5–15.5)
WBC: 13 10*3/uL — ABNORMAL HIGH (ref 4.0–10.5)
nRBC: 0 % (ref 0.0–0.2)

## 2020-11-12 LAB — BASIC METABOLIC PANEL
Anion gap: 9 (ref 5–15)
BUN: 13 mg/dL (ref 8–23)
CO2: 24 mmol/L (ref 22–32)
Calcium: 8.3 mg/dL — ABNORMAL LOW (ref 8.9–10.3)
Chloride: 102 mmol/L (ref 98–111)
Creatinine, Ser: 0.6 mg/dL — ABNORMAL LOW (ref 0.61–1.24)
GFR, Estimated: >60 mL/min (ref >60)
Glucose, Bld: 154 mg/dL — ABNORMAL HIGH (ref 70–99)
Potassium: 3.6 mmol/L (ref 3.5–5.1)
Sodium: 135 mmol/L (ref 135–145)

## 2020-11-12 LAB — LEGIONELLA PNEUMOPHILA SEROGP 1 UR AG: L. pneumophila Serogp 1 Ur Ag: NEGATIVE

## 2020-11-12 LAB — MAGNESIUM: Magnesium: 1.9 mg/dL (ref 1.7–2.4)

## 2020-11-12 LAB — GLUCOSE, CAPILLARY
Glucose-Capillary: 188 mg/dL — ABNORMAL HIGH (ref 70–99)
Glucose-Capillary: 168 mg/dL — ABNORMAL HIGH (ref 70–99)
Glucose-Capillary: 124 mg/dL — ABNORMAL HIGH (ref 70–99)
Glucose-Capillary: 222 mg/dL — ABNORMAL HIGH (ref 70–99)

## 2020-11-12 LAB — PROCALCITONIN: Procalcitonin: 0.14 ng/mL

## 2020-11-12 LAB — C-REACTIVE PROTEIN: CRP: 24.1 mg/dL — ABNORMAL HIGH (ref <1.0)

## 2020-11-12 NOTE — Consult Note (Signed)
Chief Complaint: Patient was seen in consultation today for image guided aspiration of the subcutaneous fluid collection from T3-4 through T6-7 Chief Complaint  Patient presents with  . Shortness of Breath   at the request of [facility], [facility].   Referring Physician(s): Alpine, C.   Supervising Physician: Markus Daft  Patient Status: [facility] - In-pt  History of Present Illness: RASHANN DEANE is a 75 y.o. male s/p removal of hardware of T5, T6 pedicle screws/rods, exploration of fusion on 09/14/20, diagnosed with hardware associated Staph Epidermidis osteomyelitis. Patient was seen by ID on 09/28/20 and was undergoing abx treatment as an outpatient. Patient was seen by PCP on 4/26 for CC of SOB and found to be hypoxic, was referred to ED and eventually admitted due to PNA. Patient underwent MR T spine due to worsening of chronic back pain which showed "negative for epidural abscess. New fluid collection in the subcutaneous from the T3-4 through the T6-7 level measuring up to 6 cm in length by 2.3 x 5.9 cm, likely a seroma."  IR was requested for image guided aspiration of the subcutaneous fluid collection by ID.   Patient laying in bed, not in acute distress.   Past Medical History:  Diagnosis Date  . Acute pharyngitis 10/21/2013  . Allergy    grass and pollen  . Anxiety and depression 10/25/2011  . BPH (benign prostatic hyperplasia) 04/23/2012  . Chicken pox as a child  . DDD (degenerative disc disease)    cervical responds to steroid injections and low back required surgery  . DDD (degenerative disc disease), lumbosacral   . Diabetes mellitus    pre  . Dyspnea   . ED (erectile dysfunction) 04/23/2012  . Elevated BP   . 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 Esophageal reflux 02/10/2015  . Fatigue   . HTN (hypertension)   . Hyperglycemia    preDM   . Hyperlipidemia   . Insomnia   . Low back pain radiating to both legs 01/16/2017  . Measles as a child  . Medicare annual wellness visit,  subsequent 02/10/2015  . Overweight(278.02)   . Peripheral neuropathy 08/07/2019  . Personal history of colonic polyps 10/27/2012   Follows with [facility]  . Pneumonia    " walking"  . Preventative health care   . 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  . Wears glasses     Past Surgical History:  Procedure Laterality Date  . BACK SURGERY  2012 and 1994   Dr Margret Chance, screws and cage in low back  . BIOPSY  01/09/2020   Procedure: BIOPSY;  Surgeon: Otis Brace, MD;  Location: [facility];  Service: Gastroenterology;;  . COLONOSCOPY WITH PROPOFOL N/A 01/09/2020   Procedure: COLONOSCOPY WITH PROPOFOL;  Surgeon: Otis Brace, MD;  Location: [facility];  Service: Gastroenterology;  Laterality: N/A;  . ESOPHAGOGASTRODUODENOSCOPY (EGD) WITH PROPOFOL N/A 01/09/2020   Procedure: ESOPHAGOGASTRODUODENOSCOPY (EGD) WITH PROPOFOL;  Surgeon: Otis Brace, MD;  Location: [facility];  Service: Gastroenterology;  Laterality: N/A;  . EYE SURGERY Bilateral    2016  . HARDWARE REVISION  03/12/2019   REVISION OF HARDWARE THORACIC TEN-THORACIC ELEVEN WITH APPLICATION OF ADDITIONAL RODS AND ROD SLEEVES THORACIC EIGHT-THORACIC TWELVE 12-LUMBAR ONE (N/A )  . LAMINECTOMY  02/12/2019   DECOMPRESSIVE LAMINECTOMY THORACIC NINE-THORACIC TEN AND THORACIC TEN-THORACIC ELEVEN, EXTENSION OF THORACOLUMBAR FUSION FROM THORACIC TEN TO THORACIC FIVE, LOCAL BONE GRAFT, ALLOGRAFT AND VIVIGEN (N/A)  . POLYPECTOMY  01/09/2020   Procedure: POLYPECTOMY;  Surgeon: Otis Brace, MD;  Location:  [facility];  Service: Gastroenterology;;  . REMOVAL OF RODS AND PEDICLE SCREWS T5 AND T6, EXPLORATION OF FUSION, BIOPSY TRANSPEDICULAR T5 AND T6 (N/A )  09/14/2020  . SPINAL FUSION N/A 02/12/2019   Procedure: DECOMPRESSIVE LAMINECTOMY THORACIC NINE-THORACIC TEN AND THORACIC TEN-THORACIC ELEVEN, EXTENSION OF THORACOLUMBAR FUSION FROM THORACIC TEN TO THORACIC FIVE, LOCAL BONE GRAFT, ALLOGRAFT AND VIVIGEN;  Surgeon:  Kerrin Champagne, MD;  Location: [facility] OR;  Service: Orthopedics;  Laterality: N/A;  DECOMPRESSIVE LAMINECTOMY THORACIC NINE-THORACIC TEN AND THORACIC TEN-THORACIC ELEVEN, EXTENSION OF TH  . SPINAL FUSION N/A 03/12/2019   Procedure: REVISION OF HARDWARE THORACIC TEN-THORACIC ELEVEN WITH APPLICATION OF ADDITIONAL RODS AND ROD SLEEVES THORACIC EIGHT-THORACIC TWELVE 12-LUMBAR ONE;  Surgeon: Kerrin Champagne, MD;  Location: [facility] OR;  Service: Orthopedics;  Laterality: N/A;  . TONSILLECTOMY    . TONSILLECTOMY     as child  . torn rotator cuff  2010   right    Allergies: Patient has no known allergies.  Medications: Prior to Admission medications   Medication Sig Start Date End Date Taking? Authorizing Provider  albuterol (VENTOLIN HFA) 108 (90 Base) MCG/ACT inhaler Inhale 2 puffs into the lungs every 6 (six) hours as needed. 11/02/20  Yes Saguier, Ramon Dredge, PA-C  atorvastatin (LIPITOR) 10 MG tablet Take 1 tablet (10 mg total) by mouth daily. 10/28/20  Yes Saguier, Ramon Dredge, PA-C  b complex vitamins capsule Take 1 capsule by mouth daily.   Yes [provider]  BAYER ASPIRIN EC LOW DOSE 81 MG EC tablet Take 81 mg by mouth every morning. 04/06/20  Yes [provider]  busPIRone (BUSPAR) 7.5 MG tablet 1-2 TAB BY MOUTH 3 TIMES A DAY FOR ANXIETY Patient taking differently: Take 7.5 mg by mouth 2 (two) times daily. 12/17/19  Yes Saguier, Ramon Dredge, PA-C  cetirizine (ZYRTEC) 10 MG tablet Take 10 mg by mouth daily. In the morning   Yes [provider]  DAPTOmycin (CUBICIN) 500 MG injection Inject into the vein daily. 11/05/20  Yes [provider]  famotidine (PEPCID) 40 MG tablet TAKE 1 TABLET BY MOUTH EVERYDAY AT BEDTIME Patient taking differently: Take 40 mg by mouth at bedtime. 11/09/20  Yes Saguier, Ramon Dredge, PA-C  ferrous fumarate (FERRETTS) 325 (106 Fe) MG TABS tablet Take 1 tablet (106 mg of iron total) by mouth daily. 10/28/20  Yes Saguier, Ramon Dredge, PA-C  HYDROcodone-acetaminophen  (NORCO) 7.5-325 MG tablet Take 1 tablet by mouth every 4 (four) hours as needed for up to 7 days for moderate pain ((score 4 to 6)). 09/15/20 09/22/20 Yes Kerrin Champagne, MD  JANUVIA 50 MG tablet TAKE 1 TABLET BY MOUTH EVERY DAY Patient taking differently: Take 50 mg by mouth daily. 07/06/20  Yes Saguier, Ramon Dredge, PA-C  metFORMIN (GLUCOPHAGE) 1000 MG tablet Take 1 tablet (1,000 mg total) by mouth 2 (two) times daily with a meal. 10/26/20  Yes Saguier, Ramon Dredge, PA-C  olmesartan (BENICAR) 5 MG tablet Take 1 tablet (5 mg total) by mouth daily. 10/23/20  Yes Sharlene Dory, DO  pantoprazole (PROTONIX) 40 MG tablet TAKE 1 TABLET BY MOUTH TWICE A DAY BEFORE A MEAL Patient taking differently: Take 40 mg by mouth 2 (two) times daily before a meal. 06/17/20  Yes Saguier, Ramon Dredge, PA-C  pregabalin (LYRICA) 75 MG capsule TAKE 1 CAPSULE BY MOUTH TWICE A DAY Patient taking differently: Take 75 mg by mouth 2 (two) times daily. 03/09/20  Yes Kerrin Champagne, MD  sucralfate (CARAFATE) 1 g tablet Take 1 tablet (1 g total) by  mouth 4 (four) times daily -  with meals and at bedtime. 10/05/20  Yes Saguier, Percell Miller, PA-C  tamsulosin (FLOMAX) 0.4 MG CAPS capsule Take 2 capsules (0.8 mg total) by mouth daily. 05/06/20  Yes Saguier, Percell Miller, PA-C  testosterone cypionate (DEPOTESTOSTERONE CYPIONATE) 200 MG/ML injection INJECT 1 ML (200 MG TOTAL) INTO THE MUSCLE EVERY 14 (FOURTEEN) DAYS. 09/30/20  Yes Saguier, Percell Miller, PA-C  tiZANidine (ZANAFLEX) 4 MG tablet Take 1.5 tablets (6 mg total) by mouth every 6 (six) hours as needed for muscle spasms. Patient taking differently: Take 6 mg by mouth every 4 (four) hours as needed for muscle spasms. 11/04/20  Yes Lanae Crumbly, PA-C  venlafaxine XR (EFFEXOR-XR) 150 MG 24 hr capsule TAKE 1 CAPSULE (150 MG TOTAL) BY MOUTH DAILY WITH BREAKFAST. 06/16/20  Yes Saguier, Percell Miller, PA-C  glucose blood (CONTOUR NEXT TEST) test strip Check blood sugar once daily 02/25/19   Saguier, Percell Miller, PA-C  sodium  polystyrene (KAYEXALATE) 15 GM/60ML suspension 60 ml po q day for 4 days Patient not taking: Reported on 11/11/2020 11/03/20   Saguier, Percell Miller, PA-C  zolpidem (AMBIEN) 5 MG tablet Take 1 tablet (5 mg total) by mouth at bedtime as needed for sleep. 11/11/20   Saguier, Percell Miller, PA-C     Family History  Problem Relation Age of Onset  . Hypertension Mother   . Diabetes Mother        type 2  . Cancer Mother 74       breast in remission  . Emphysema Father        smoker  . COPD Father        smoker  . Stroke Father 74       mini  . Heart disease Father   . Hypertension Sister   . Hyperlipidemia Sister   . Scoliosis Sister   . Osteoporosis Sister   . Hypertension Maternal Grandmother   . Scoliosis Maternal Grandmother   . Heart disease Maternal Grandfather   . Heart disease Paternal Grandfather        smoker  . Heart disease Daughter     Social History   Socioeconomic History  . Marital status: Married    Spouse name: Not on file  . Number of children: Not on file  . Years of education: Not on file  . Highest education level: Not on file  Occupational History  . Not on file  Tobacco Use  . Smoking status: Former Smoker    Packs/day: 1.00    Years: 20.00    Pack years: 20.00    Types: Cigarettes    Quit date: 07/18/1989    Years since quitting: 31.3  . Smokeless tobacco: Never Used  . Tobacco comment: off and on smoking for the 20 yrs  Vaping Use  . Vaping Use: Never used  Substance and Sexual Activity  . Alcohol use: Not Currently    Comment: drink or two a day- mixed drink  . Drug use: No  . Sexual activity: Yes    Comment: lives with wife, still working, no dietary restrictions, continues to exercise intermittently  Other Topics Concern  . Not on file  Social History Narrative  . Not on file   Social Determinants of Health   Financial Resource Strain: Not on file  Food Insecurity: Not on file  Transportation Needs: Not on file  Physical Activity: Not on file   Stress: Not on file  Social Connections: Not on file     Review of Systems: A  12 point ROS discussed and pertinent positives are indicated in the HPI above.  All other systems are negative.   Vital Signs: BP (!) 154/70 (BP Location: Left Arm)   Pulse 96   Temp 98.2 F (36.8 C) (Oral)   Resp (!) 24   Ht 5\' 10"  (1.778 m)   Wt 167 lb 5.3 oz (75.9 kg)   SpO2 93%   BMI 24.01 kg/m   Physical Exam Vitals reviewed.  Constitutional:      General: He is not in acute distress.    Appearance: He is well-developed. He is not ill-appearing.  Eyes:     Extraocular Movements: Extraocular movements intact.     Pupils: Pupils are equal, round, and reactive to light.  Cardiovascular:     Rate and Rhythm: Normal rate.  Pulmonary:     Effort: Pulmonary effort is normal. No tachypnea.  Skin:    General: Skin is warm and dry.     Coloration: Skin is not cyanotic or pale.  Neurological:     Mental Status: He is alert and oriented to person, place, and time.  Psychiatric:        Mood and Affect: Mood normal.        Behavior: Behavior normal.        Imaging: CT Angio Chest PE W/Cm &/Or Wo Cm  Result Date: 11/10/2020 CLINICAL DATA:  75 year old male with concern for pulmonary embolism. EXAM: CT ANGIOGRAPHY CHEST WITH CONTRAST TECHNIQUE: Multidetector CT imaging of the chest was performed using the standard protocol during bolus administration of intravenous contrast. Multiplanar CT image reconstructions and MIPs were obtained to evaluate the vascular anatomy. CONTRAST:  68mL OMNIPAQUE IOHEXOL 350 MG/ML SOLN COMPARISON:  Chest radiograph dated 11/10/2020 and CT dated 12/25/2019. FINDINGS: Cardiovascular: There is no cardiomegaly or pericardial effusion. There is coronary vascular calcification of the LAD. The thoracic aorta is unremarkable. The origins of the great vessels of the aortic arch appear patent as visualized. Evaluation of the pulmonary arteries is limited due to respiratory motion  artifact and streak artifact caused by spinal metallic hardware. No pulmonary artery embolus identified. Mediastinum/Nodes: Right hilar adenopathy measures 15 mm in short axis. Mildly enlarged lymph node anterior to the trachea measures 10 mm short axis. The esophagus and the thyroid gland are grossly unremarkable. No mediastinal fluid collection. Right-sided PICC with tip at the cavoatrial junction. Lungs/Pleura: There is background of mild centrilobular emphysema. Large patchy area of interstitial coarsening and diffuse ground-glass opacity primarily involving the lower lung zone and peripheral and subpleural lung new since the prior CT and most consistent with pneumonia, likely atypical in etiology. Clinical correlation and follow-up to resolution recommended. Several subpleural ground-glass nodules may have central cavitation and therefore septic emboli are not excluded. A 5 mm left lower lobe nodule appears similar to prior CT. There is a small right pleural effusion. No pneumothorax. The central airways are patent. Upper Abdomen: No acute abnormality. Musculoskeletal: Degenerative changes, osteopenia, and extensive spinal fusion. No acute osseous pathology. Review of the MIP images confirms the above findings. IMPRESSION: 1. No CT evidence of pulmonary embolism. 2. Findings most consistent with pneumonia, likely atypical in etiology. Clinical correlation and follow-up to resolution recommended. 3. Small right pleural effusion. 4. Aortic Atherosclerosis (ICD10-I70.0) and Emphysema (ICD10-J43.9). Electronically Signed   By: Anner Crete M.D.   On: 11/10/2020 18:32   MR THORACIC SPINE W WO CONTRAST  Result Date: 11/11/2020 CLINICAL DATA:  Epidural abscess. EXAM: MRI THORACIC WITHOUT AND WITH CONTRAST TECHNIQUE:  Multiplanar and multiecho pulse sequences of the thoracic spine were obtained without and with intravenous contrast. CONTRAST:  52mL GADAVIST GADOBUTROL 1 MMOL/ML IV SOLN COMPARISON:  MRI of the  thoracic spine April 23, 2020 FINDINGS: Alignment:  Maintained. Vertebrae: Postsurgical changes from posterior fixation from T7 through the visualized upper lumbar spine with interval removal of T5 and T6 posterior hardware. Susceptibility artifact from the hardware limits evaluation, particularly in the lower thoracic region. Endplate destructive changes at T4-5 appear stable with improved marrow edema. Stable appearance of the T9-10 effusion. No new focus suspicious for discitis/osteomyelitis. No evidence of an epidural abscess. Cord: Evaluation limited by susceptibility artifact. No obvious cord lesion. Paraspinal and other soft tissues: New fluid collection in the subcutaneous from the T3-4 through the T6-7 level measuring up to 6 cm in length by 2.3 x 5.9 largest transverse and anteroposterior dimensions, likely related to seroma. Extensive right lung consolidation better described on recent chest CT. Disc levels: No high-grade spinal canal stenosis from T1-2 through T9-10. T10-11 and T11-12 levels are obscured by susceptibility artifact. No neural foraminal narrowing from T1-2 through T7-8. Evaluation of the neural foramen below T7-8 is limited by susceptibility artifact. IMPRESSION: 1. Postsurgical changes from posterior fixation from T7 through the visualized upper lumbar spine with interval removal of T5 and T6 posterior hardware. Susceptibility artifact from the hardware limits evaluation, particularly in the lower thoracic region. 2. Endplate destructive changes at T4-5 appear stable with improved marrow edema. Stable appearance of the T9-10 effusion. No evidence of an epidural abscess. 3. New fluid collection in the subcutaneous from the T3-4 through the T6-7 level measuring up to 6 cm in length by 2.3 x 5.9 cm, likely a seroma. 4. Extensive right lung consolidation better described on recent chest CT. Electronically Signed   By: Pedro Earls M.D.   On: 11/11/2020 13:47   DG Chest  Port 1 View  Result Date: 11/10/2020 CLINICAL DATA:  75 year old male with shortness of breath. EXAM: PORTABLE CHEST 1 VIEW COMPARISON:  Chest radiograph dated 01/07/2020. FINDINGS: Right-sided PICC with tip over the central SVC close to the cavoatrial junction. Right-sided pulmonary opacities predominantly involving the peripheral and subpleural lung, new since the prior radiograph, and most consistent with pneumonia and possibly viral or atypical in etiology. Clinical correlation and follow-up to resolution is recommended. The left lung is clear. No pleural effusion or pneumothorax. The cardiac silhouette is within limits. Spinal fusion hardware. No acute osseous pathology. IMPRESSION: Right-sided pulmonary opacities most consistent with pneumonia. Clinical correlation and follow-up to resolution is recommended. Electronically Signed   By: Anner Crete M.D.   On: 11/10/2020 17:25   ECHOCARDIOGRAM COMPLETE  Result Date: 11/11/2020    ECHOCARDIOGRAM REPORT   Patient Name:   WHITFIELD FOBBS Date of Exam: 11/11/2020 Medical Rec #:  [id number]        Height:       70.0 in Accession #:    [id number]       Weight:       167.3 lb Date of Birth:  1946/01/25         BSA:          1.935 m Patient Age:    21 years         BP:           114/68 mmHg Patient Gender: M                HR:  102 bpm. Exam Location:  Inpatient Procedure: 2D Echo Indications:    Bacteremia  History:        Patient has no prior history of Echocardiogram examinations.                 Risk Factors:Hypertension, Dyslipidemia and Diabetes.  Sonographer:    Mikki Santee RDCS (AE) Referring Phys: [place]  1. Left ventricular ejection fraction, by estimation, is 60 to 65%. The left ventricle has normal function. The left ventricle has no regional wall motion abnormalities. Left ventricular diastolic parameters were normal.  2. Right ventricular systolic function is normal. The right ventricular size is mildly  enlarged.  3. The mitral valve is normal in structure. No evidence of mitral valve regurgitation.  4. The aortic valve was not well visualized. Aortic valve regurgitation is not visualized. No aortic stenosis is present.  5. The inferior vena cava is normal in size with greater than 50% respiratory variability, suggesting right atrial pressure of 3 mmHg. Conclusion(s)/Recommendation(s): No vegetation seen. If high clinical suspicion for endocarditis consider TEE. FINDINGS  Left Ventricle: Left ventricular ejection fraction, by estimation, is 60 to 65%. The left ventricle has normal function. The left ventricle has no regional wall motion abnormalities. The left ventricular internal cavity size was normal in size. There is  no left ventricular hypertrophy. Left ventricular diastolic parameters were normal. Right Ventricle: The right ventricular size is mildly enlarged. No increase in right ventricular wall thickness. Right ventricular systolic function is normal. Left Atrium: Left atrial size was normal in size. Right Atrium: Right atrial size was normal in size. Pericardium: Trivial pericardial effusion is present. Mitral Valve: The mitral valve is normal in structure. No evidence of mitral valve regurgitation. Tricuspid Valve: The tricuspid valve is normal in structure. Tricuspid valve regurgitation is not demonstrated. Aortic Valve: The aortic valve was not well visualized. Aortic valve regurgitation is not visualized. No aortic stenosis is present. Pulmonic Valve: The pulmonic valve was not well visualized. Pulmonic valve regurgitation is not visualized. Aorta: The aortic root is normal in size and structure. Venous: The inferior vena cava is normal in size with greater than 50% respiratory variability, suggesting right atrial pressure of 3 mmHg. IAS/Shunts: The interatrial septum was not well visualized.  LEFT VENTRICLE PLAX 2D LVIDd:         4.70 cm  Diastology LVIDs:         2.80 cm  LV e' medial:    7.83 cm/s  LV PW:         0.80 cm  LV E/e' medial:  11.5 LV IVS:        0.80 cm  LV e' lateral:   8.49 cm/s LVOT diam:     2.10 cm  LV E/e' lateral: 10.6 LV SV:         85 LV SV Index:   44 LVOT Area:     3.46 cm  RIGHT VENTRICLE RV S prime:     11.80 cm/s TAPSE (M-mode): 1.1 cm LEFT ATRIUM           Index       RIGHT ATRIUM           Index LA diam:      3.20 cm 1.65 cm/m  RA Area:     13.30 cm LA Vol (A2C): 52.2 ml 26.98 ml/m RA Volume:   27.80 ml  14.37 ml/m LA Vol (A4C): 37.0 ml 19.12 ml/m  AORTIC VALVE LVOT  Vmax:   147.00 cm/s LVOT Vmean:  92.500 cm/s LVOT VTI:    0.246 m  AORTA Ao Root diam: 3.20 cm MITRAL VALVE MV Area (PHT): 5.42 cm    SHUNTS MV Decel Time: 140 msec    Systemic VTI:  0.25 m MV E velocity: 89.70 cm/s  Systemic Diam: 2.10 cm MV A velocity: 88.60 cm/s MV E/A ratio:  1.01 Oswaldo Milian MD Electronically signed by Oswaldo Milian MD Signature Date/Time: 11/11/2020/6:43:20 PM    Final     Labs:  CBC: Recent Labs    11/02/20 1543 11/10/20 1629 11/11/20 0424 11/12/20 0349  WBC 8.6 14.9* 13.6* 13.0*  HGB 12.5* 12.6* 12.6* 11.2*  HCT 37.2* 37.8* 39.0 34.0*  PLT 453.0* 511* 527* 439*    COAGS: Recent Labs    09/14/20 0614  INR 1.1    BMP: Recent Labs    01/10/20 0446 02/26/20 0958 02/28/20 1019 05/26/20 0939 07/01/20 0857 08/17/20 1458 09/08/20 0949 09/15/20 0158 11/02/20 1543 11/10/20 1629 11/11/20 0424 11/12/20 0349  NA 135 131*   < > 133*   < > 133*   < > 134* 137 130* 136 135  K 4.3 5.0   < > 4.7   < > 4.7   < > 4.0 5.4* 3.8 3.7 3.6  CL 100 93*   < > 94*   < > 96*   < > 100 99 94* 99 102  CO2 28 32   < > 32   < > 30   < > 27 31 23 24 24   GLUCOSE 168* 108*   < > 159*   < > 98   < > 199* 121* 136* 185* 154*  BUN 10 14   < > 10   < > 13   < > 10 14 15 14 13   CALCIUM 8.8* 9.8   < > 9.5   < > 9.6   < > 8.5* 9.3 8.8* 8.8* 8.3*  CREATININE 0.64 0.70   < > 0.73   < > 0.78   < > 0.68 0.70 0.50* 0.72 0.60*  GFRNONAA >60 92  --  90  --  88   < > >60  --   >60 >60 >60  GFRAA >60 106  --  104  --  102  --   --   --   --   --   --    < > = values in this interval not displayed.    LIVER FUNCTION TESTS: Recent Labs    02/28/20 1019 07/01/20 0857 08/17/20 1458 09/14/20 0614 11/02/20 1543  BILITOT 0.7 0.7 1.0 0.5 0.6  AST 21 24 18 16 14   ALT 10 11 7* 9 8  ALKPHOS 73 75  --  73 95  PROT 6.7 6.6 6.6 5.5* 6.1  ALBUMIN 4.4 4.4  --  3.0* 3.8    TUMOR MARKERS: No results for input(s): AFPTM, CEA, CA199, CHROMGRNA in the last 8760 hours.  Assessment and Plan: 75 y.o. male s/p removal of hardware of T5, T6 pedicle screws/rods, exploration of fusion on 09/14/20, diagnosed with hardware associated Staph Epi osteomyelitis. Patient was being treated with IV abx as an outpatient until when he was admitted to [facility] due to PNA. Patient underwent MR T spine due to worsening of chronic back pain which showed a new fluid collection.   IR was requested for image guided aspiration of the subcutaneous fluid collection by ID.  Case was reviewed and approved by Dr.  Henn for ultrasound guided aspiration of the subcutaneous fluid collection without sedation.  VSS WBC elevated, 13.0 afebrile. hgb 11.2 platelets 439  Lovenox held, INR 1.1 on 09/14/20  Risks and benefits of subcutaneous fluid collection  was discussed with the patient and/or patient's family including, but not limited to bleeding, infection, damage to adjacent structures or low yield requiring additional tests.  All of the questions were answered and there is agreement to proceed.  Consent signed and in chart.    Thank you for this interesting consult.  I greatly enjoyed meeting MUHSIN BAIRES and look forward to participating in their care.  A copy of this report was sent to the requesting provider on this date.  Electronically Signed: Tera Mater, PA-C 11/12/2020, 1:06 PM   I spent a total of  20 min in face to face in clinical consultation, greater than 50% of which was  counseling/coordinating care for aspiration of subcutaneous fluid collection

## 2020-11-12 NOTE — TOC Progression Note (Addendum)
Transition of Care [facility]) - Progression Note    Patient Details  Name: Luis Brown MRN: [id number] Date of Birth: 1945-11-17  Transition of Care [facility]) CM/SW Contact  Theresia Pree, Juliann Pulse, RN Phone Number: 11/12/2020, 6:29 PM  Clinical Narrative:Spoke to spouse about d/c plans-has used [facility] in past for [facility] iv abx-per [facility] rep Ramond Marrow unable to provide [facility] this admission d/t insurance. [name] rep w/[employer]-iv home infusion will follow for initial home iv infusion-will need [facility] for further teaching of IV infusion if needed.    Updated info-Already active w/[employer] rep [name], & [facility]-HHRN iv abx following.         Expected Discharge Plan and Services                                                 Social Determinants of Health (SDOH) Interventions    Readmission Risk Interventions No flowsheet data found.

## 2020-11-12 NOTE — Progress Notes (Signed)
Subjective: Still having diaphoresis but also anxious to go home   Antibiotics:  Anti-infectives (From admission, onward)   Start     Dose/Rate Route Frequency Ordered Stop   11/11/20 1400  metroNIDAZOLE (FLAGYL) tablet 500 mg        500 mg Oral Every 8 hours 11/11/20 0941     11/11/20 0100  azithromycin (ZITHROMAX) 500 mg in sodium chloride 0.9 % 250 mL IVPB  Status:  Discontinued        500 mg 250 mL/hr over 60 Minutes Intravenous Every 24 hours 11/10/20 2324 11/11/20 0941   11/10/20 1830  ceFEPIme (MAXIPIME) 2 g in sodium chloride 0.9 % 100 mL IVPB        2 g 200 mL/hr over 30 Minutes Intravenous Every 8 hours 11/10/20 1809     11/10/20 1830  vancomycin (VANCOREADY) IVPB 1750 mg/350 mL  Status:  Discontinued        1,750 mg 175 mL/hr over 120 Minutes Intravenous Every 24 hours 11/10/20 1809 11/10/20 1817   11/10/20 1830  vancomycin (VANCOCIN) IVPB 1000 mg/200 mL premix        1,000 mg 200 mL/hr over 60 Minutes Intravenous Every 12 hours 11/10/20 1817     11/10/20 1816  vancomycin (VANCOCIN) 1000 MG powder       Note to Pharmacy: Guerry Bruin   : cabinet override      11/10/20 1816 11/10/20 1946      Medications: Scheduled Meds: . atorvastatin  10 mg Oral Daily  . busPIRone  7.5 mg Oral BID  . Chlorhexidine Gluconate Cloth  6 each Topical Daily  . enoxaparin (LOVENOX) injection  40 mg Subcutaneous Q24H  . famotidine  20 mg Oral Daily  . Ferrous Fumarate  1 tablet Oral Daily  . insulin aspart  0-9 Units Subcutaneous TID WC  . irbesartan  37.5 mg Oral Daily  . metroNIDAZOLE  500 mg Oral Q8H  . pantoprazole  40 mg Oral BID AC  . pregabalin  75 mg Oral BID  . sucralfate  1 g Oral TID WC & HS  . tamsulosin  0.8 mg Oral Daily  . venlafaxine XR  150 mg Oral Q breakfast   Continuous Infusions: . ceFEPime (MAXIPIME) IV 2 g (11/12/20 0227)  . vancomycin 1,000 mg (11/12/20 0639)   PRN Meds:.albuterol, HYDROcodone-acetaminophen, ipratropium-albuterol, sodium  chloride flush, tiZANidine, zolpidem    Objective: Weight change:   Intake/Output Summary (Last 24 hours) at 11/12/2020 1038 Last data filed at 11/12/2020 0600 Gross per 24 hour  Intake 1483.2 ml  Output --  Net 1483.2 ml   Blood pressure (!) 154/70, pulse 96, temperature 98.2 F (36.8 C), temperature source Oral, resp. rate (!) 24, height 5\' 10"  (1.778 m), weight 75.9 kg, SpO2 93 %. Temp:  [98.2 F (36.8 C)-98.5 F (36.9 C)] 98.2 F (36.8 C) (04/28 0513) Pulse Rate:  [80-108] 96 (04/28 0513) Resp:  [20-24] 24 (04/28 0515) BP: (114-154)/(60-70) 154/70 (04/28 0513) SpO2:  [93 %] 93 % (04/28 0513)  Physical Exam: Physical Exam Constitutional:      Appearance: He is well-developed.  HENT:     Head: Normocephalic and atraumatic.  Eyes:     Conjunctiva/sclera: Conjunctivae normal.  Cardiovascular:     Rate and Rhythm: Normal rate and regular rhythm.  Pulmonary:     Effort: Pulmonary effort is normal. No respiratory distress.     Breath sounds: Examination of the right-lower field reveals rhonchi. Rhonchi present.  No wheezing.  Abdominal:     General: There is no distension.     Palpations: Abdomen is soft.  Musculoskeletal:        General: Normal range of motion.     Cervical back: Normal range of motion and neck supple.  Skin:    General: Skin is warm and dry.     Findings: No erythema or rash.  Neurological:     General: No focal deficit present.     Mental Status: He is alert and oriented to person, place, and time.  Psychiatric:        Mood and Affect: Mood normal.        Behavior: Behavior normal.        Thought Content: Thought content normal.        Judgment: Judgment normal.      Surgical wound palpated I could not palpate the area of the seroma versus possible infection based on MRI findings  CBC:    BMET Recent Labs    11/11/20 0424 11/12/20 0349  NA 136 135  K 3.7 3.6  CL 99 102  CO2 24 24  GLUCOSE 185* 154*  BUN 14 13  CREATININE 0.72  0.60*  CALCIUM 8.8* 8.3*     Liver Panel  No results for input(s): PROT, ALBUMIN, AST, ALT, ALKPHOS, BILITOT, BILIDIR, IBILI in the last 72 hours.     Sedimentation Rate Recent Labs    11/11/20 1548  ESRSEDRATE 47*   C-Reactive Protein Recent Labs    11/12/20 0349  CRP 24.1*    Micro Results: Recent Results (from the past 720 hour(s))  Resp Panel by RT-PCR (Flu A&B, Covid) Nasopharyngeal Swab     Status: None   Collection Time: 11/10/20  4:54 PM   Specimen: Nasopharyngeal Swab; Nasopharyngeal(NP) swabs in vial transport medium  Result Value Ref Range Status   SARS Coronavirus 2 by RT PCR NEGATIVE NEGATIVE Final    Comment: (NOTE) SARS-CoV-2 target nucleic acids are NOT DETECTED.  The SARS-CoV-2 RNA is generally detectable in upper respiratory specimens during the acute phase of infection. The lowest concentration of SARS-CoV-2 viral copies this assay can detect is 138 copies/mL. A negative result does not preclude SARS-Cov-2 infection and should not be used as the sole basis for treatment or other patient management decisions. A negative result may occur with  improper specimen collection/handling, submission of specimen other than nasopharyngeal swab, presence of viral mutation(s) within the areas targeted by this assay, and inadequate number of viral copies(<138 copies/mL). A negative result must be combined with clinical observations, patient history, and epidemiological information. The expected result is Negative.  Fact Sheet for Patients:  BloggerCourse.com  Fact Sheet for Healthcare Providers:  SeriousBroker.it  This test is no t yet approved or cleared by the [place] FDA and  has been authorized for detection and/or diagnosis of SARS-CoV-2 by FDA under an Emergency Use Authorization (EUA). This EUA will remain  in effect (meaning this test can be used) for the duration of the COVID-19 declaration  under Section 564(b)(1) of the Act, 21 U.S.C.section 360bbb-3(b)(1), unless the authorization is terminated  or revoked sooner.       Influenza A by PCR NEGATIVE NEGATIVE Final   Influenza B by PCR NEGATIVE NEGATIVE Final    Comment: (NOTE) The Xpert Xpress SARS-CoV-2/FLU/RSV plus assay is intended as an aid in the diagnosis of influenza from Nasopharyngeal swab specimens and should not be used as a sole basis for treatment. Nasal washings  and aspirates are unacceptable for Xpert Xpress SARS-CoV-2/FLU/RSV testing.  Fact Sheet for Patients: EntrepreneurPulse.com.au  Fact Sheet for Healthcare Providers: IncredibleEmployment.be  This test is not yet approved or cleared by the [place] FDA and has been authorized for detection and/or diagnosis of SARS-CoV-2 by FDA under an Emergency Use Authorization (EUA). This EUA will remain in effect (meaning this test can be used) for the duration of the COVID-19 declaration under Section 564(b)(1) of the Act, 21 U.S.C. section 360bbb-3(b)(1), unless the authorization is terminated or revoked.  Performed at [facility], [address] [place], [place] [address]   Culture, blood (routine x 2)     Status: None (Preliminary result)   Collection Time: 11/10/20  6:04 PM   Specimen: BLOOD  Result Value Ref Range Status   Specimen Description   Final    BLOOD PICC LINE Performed at [facility], [address] [place], [place] [address]    Special Requests   Final    BOTTLES DRAWN AEROBIC AND ANAEROBIC Blood Culture adequate volume Performed at [facility], [address] [place], [place] [address]    Culture   Final    NO GROWTH 1 DAY Performed at [facility], [place] [address], [place], [place] [address]    Report Status PENDING  Incomplete  Culture, blood (routine x 2)     Status: None (Preliminary result)   Collection Time: 11/10/20  6:05 PM   Specimen:  BLOOD LEFT ARM  Result Value Ref Range Status   Specimen Description   Final    BLOOD LEFT ARM Performed at [facility], [address] [place], [place] [address]    Special Requests   Final    BOTTLES DRAWN AEROBIC AND ANAEROBIC Blood Culture adequate volume Performed at [facility], [address] [place], [place] [address]    Culture   Final    NO GROWTH 1 DAY Performed at [facility], [place] [address], [place], [place] [address]    Report Status PENDING  Incomplete    Studies/Results: CT Angio Chest PE W/Cm &/Or Wo Cm  Result Date: 11/10/2020 CLINICAL DATA:  75 year old male with concern for pulmonary embolism. EXAM: CT ANGIOGRAPHY CHEST WITH CONTRAST TECHNIQUE: Multidetector CT imaging of the chest was performed using the standard protocol during bolus administration of intravenous contrast. Multiplanar CT image reconstructions and MIPs were obtained to evaluate the vascular anatomy. CONTRAST:  52mL OMNIPAQUE IOHEXOL 350 MG/ML SOLN COMPARISON:  Chest radiograph dated 11/10/2020 and CT dated 12/25/2019. FINDINGS: Cardiovascular: There is no cardiomegaly or pericardial effusion. There is coronary vascular calcification of the LAD. The thoracic aorta is unremarkable. The origins of the great vessels of the aortic arch appear patent as visualized. Evaluation of the pulmonary arteries is limited due to respiratory motion artifact and streak artifact caused by spinal metallic hardware. No pulmonary artery embolus identified. Mediastinum/Nodes: Right hilar adenopathy measures 15 mm in short axis. Mildly enlarged lymph node anterior to the trachea measures 10 mm short axis. The esophagus and the thyroid gland are grossly unremarkable. No mediastinal fluid collection. Right-sided PICC with tip at the cavoatrial junction. Lungs/Pleura: There is background of mild centrilobular emphysema. Large patchy area of interstitial coarsening and diffuse ground-glass opacity  primarily involving the lower lung zone and peripheral and subpleural lung new since the prior CT and most consistent with pneumonia, likely atypical in etiology. Clinical correlation and follow-up  to resolution recommended. Several subpleural ground-glass nodules may have central cavitation and therefore septic emboli are not excluded. A 5 mm left lower lobe nodule appears similar to prior CT. There is a small right pleural effusion. No pneumothorax. The central airways are patent. Upper Abdomen: No acute abnormality. Musculoskeletal: Degenerative changes, osteopenia, and extensive spinal fusion. No acute osseous pathology. Review of the MIP images confirms the above findings. IMPRESSION: 1. No CT evidence of pulmonary embolism. 2. Findings most consistent with pneumonia, likely atypical in etiology. Clinical correlation and follow-up to resolution recommended. 3. Small right pleural effusion. 4. Aortic Atherosclerosis (ICD10-I70.0) and Emphysema (ICD10-J43.9). Electronically Signed   By: Anner Crete M.D.   On: 11/10/2020 18:32   MR THORACIC SPINE W WO CONTRAST  Result Date: 11/11/2020 CLINICAL DATA:  Epidural abscess. EXAM: MRI THORACIC WITHOUT AND WITH CONTRAST TECHNIQUE: Multiplanar and multiecho pulse sequences of the thoracic spine were obtained without and with intravenous contrast. CONTRAST:  73mL GADAVIST GADOBUTROL 1 MMOL/ML IV SOLN COMPARISON:  MRI of the thoracic spine April 23, 2020 FINDINGS: Alignment:  Maintained. Vertebrae: Postsurgical changes from posterior fixation from T7 through the visualized upper lumbar spine with interval removal of T5 and T6 posterior hardware. Susceptibility artifact from the hardware limits evaluation, particularly in the lower thoracic region. Endplate destructive changes at T4-5 appear stable with improved marrow edema. Stable appearance of the T9-10 effusion. No new focus suspicious for discitis/osteomyelitis. No evidence of an epidural abscess. Cord:  Evaluation limited by susceptibility artifact. No obvious cord lesion. Paraspinal and other soft tissues: New fluid collection in the subcutaneous from the T3-4 through the T6-7 level measuring up to 6 cm in length by 2.3 x 5.9 largest transverse and anteroposterior dimensions, likely related to seroma. Extensive right lung consolidation better described on recent chest CT. Disc levels: No high-grade spinal canal stenosis from T1-2 through T9-10. T10-11 and T11-12 levels are obscured by susceptibility artifact. No neural foraminal narrowing from T1-2 through T7-8. Evaluation of the neural foramen below T7-8 is limited by susceptibility artifact. IMPRESSION: 1. Postsurgical changes from posterior fixation from T7 through the visualized upper lumbar spine with interval removal of T5 and T6 posterior hardware. Susceptibility artifact from the hardware limits evaluation, particularly in the lower thoracic region. 2. Endplate destructive changes at T4-5 appear stable with improved marrow edema. Stable appearance of the T9-10 effusion. No evidence of an epidural abscess. 3. New fluid collection in the subcutaneous from the T3-4 through the T6-7 level measuring up to 6 cm in length by 2.3 x 5.9 cm, likely a seroma. 4. Extensive right lung consolidation better described on recent chest CT. Electronically Signed   By: Pedro Earls M.D.   On: 11/11/2020 13:47   DG Chest Port 1 View  Result Date: 11/10/2020 CLINICAL DATA:  75 year old male with shortness of breath. EXAM: PORTABLE CHEST 1 VIEW COMPARISON:  Chest radiograph dated 01/07/2020. FINDINGS: Right-sided PICC with tip over the central SVC close to the cavoatrial junction. Right-sided pulmonary opacities predominantly involving the peripheral and subpleural lung, new since the prior radiograph, and most consistent with pneumonia and possibly viral or atypical in etiology. Clinical correlation and follow-up to resolution is recommended. The left lung  is clear. No pleural effusion or pneumothorax. The cardiac silhouette is within limits. Spinal fusion hardware. No acute osseous pathology. IMPRESSION: Right-sided pulmonary opacities most consistent with pneumonia. Clinical correlation and follow-up to resolution is recommended. Electronically Signed   By: Anner Crete M.D.   On: 11/10/2020 17:25  ECHOCARDIOGRAM COMPLETE  Result Date: 11/11/2020    ECHOCARDIOGRAM REPORT   Patient Name:   Luis Brown Date of Exam: 11/11/2020 Medical Rec #:  [id number]        Height:       70.0 in Accession #:    [id number]       Weight:       167.3 lb Date of Birth:  01/09/1946         BSA:          1.935 m Patient Age:    75 years         BP:           114/68 mmHg Patient Gender: M                HR:           102 bpm. Exam Location:  Inpatient Procedure: 2D Echo Indications:    Bacteremia  History:        Patient has no prior history of Echocardiogram examinations.                 Risk Factors:Hypertension, Dyslipidemia and Diabetes.  Sonographer:    Mikki Santee RDCS (AE) Referring Phys: Luis Brown  1. Left ventricular ejection fraction, by estimation, is 60 to 65%. The left ventricle has normal function. The left ventricle has no regional wall motion abnormalities. Left ventricular diastolic parameters were normal.  2. Right ventricular systolic function is normal. The right ventricular size is mildly enlarged.  3. The mitral valve is normal in structure. No evidence of mitral valve regurgitation.  4. The aortic valve was not well visualized. Aortic valve regurgitation is not visualized. No aortic stenosis is present.  5. The inferior vena cava is normal in size with greater than 50% respiratory variability, suggesting right atrial pressure of 3 mmHg. Conclusion(s)/Recommendation(s): No vegetation seen. If high clinical suspicion for endocarditis consider TEE. FINDINGS  Left Ventricle: Left ventricular ejection fraction, by estimation,  is 60 to 65%. The left ventricle has normal function. The left ventricle has no regional wall motion abnormalities. The left ventricular internal cavity size was normal in size. There is  no left ventricular hypertrophy. Left ventricular diastolic parameters were normal. Right Ventricle: The right ventricular size is mildly enlarged. No increase in right ventricular wall thickness. Right ventricular systolic function is normal. Left Atrium: Left atrial size was normal in size. Right Atrium: Right atrial size was normal in size. Pericardium: Trivial pericardial effusion is present. Mitral Valve: The mitral valve is normal in structure. No evidence of mitral valve regurgitation. Tricuspid Valve: The tricuspid valve is normal in structure. Tricuspid valve regurgitation is not demonstrated. Aortic Valve: The aortic valve was not well visualized. Aortic valve regurgitation is not visualized. No aortic stenosis is present. Pulmonic Valve: The pulmonic valve was not well visualized. Pulmonic valve regurgitation is not visualized. Aorta: The aortic root is normal in size and structure. Venous: The inferior vena cava is normal in size with greater than 50% respiratory variability, suggesting right atrial pressure of 3 mmHg. IAS/Shunts: The interatrial septum was not well visualized.  LEFT VENTRICLE PLAX 2D LVIDd:         4.70 cm  Diastology LVIDs:         2.80 cm  LV e' medial:    7.83 cm/s LV PW:         0.80 cm  LV E/e' medial:  11.5 LV IVS:  0.80 cm  LV e' lateral:   8.49 cm/s LVOT diam:     2.10 cm  LV E/e' lateral: 10.6 LV SV:         85 LV SV Index:   44 LVOT Area:     3.46 cm  RIGHT VENTRICLE RV S prime:     11.80 cm/s TAPSE (M-mode): 1.1 cm LEFT ATRIUM           Index       RIGHT ATRIUM           Index LA diam:      3.20 cm 1.65 cm/m  RA Area:     13.30 cm LA Vol (A2C): 52.2 ml 26.98 ml/m RA Volume:   27.80 ml  14.37 ml/m LA Vol (A4C): 37.0 ml 19.12 ml/m  AORTIC VALVE LVOT Vmax:   147.00 cm/s LVOT Vmean:   92.500 cm/s LVOT VTI:    0.246 m  AORTA Ao Root diam: 3.20 cm MITRAL VALVE MV Area (PHT): 5.42 cm    SHUNTS MV Decel Time: 140 msec    Systemic VTI:  0.25 m MV E velocity: 89.70 cm/s  Systemic Diam: 2.10 cm MV A velocity: 88.60 cm/s MV E/A ratio:  1.01 Luis Milian MD Electronically signed by Luis Milian MD Signature Date/Time: 11/11/2020/6:43:20 PM    Final       Assessment/Plan:  INTERVAL HISTORY: 2D echocardiogram without vegetations.  MRI T-spine is encouraging with regards to his discs space and vertebra.  There is fluid collection in the subcutaneous area from T3 T4-T6 and T7 is 6 cm x 2.3 x 5 point centimeters in dimensions.   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  He then had loosening of screws and had them removed by Dr. Louanne Skye due to continuous pain at that site.  There is no purulence but culture did yield a Staph epidermidis.  He was placed on oral Bactrim and then seen by my partner Dr. Gale Journey who placed him on IV daptomycin with stop date due in a few days.  He has now been admitted with extensive right-sided pneumonia.  He continues to complain of back pain that is not improved despite IV antibiotics.  #1 Pneumonia: Continue vancomycin cefepime and metronidazole for now  Does have some areas of cavitation I would have not suggested septic emboli as radiology did since it is unilateral.  2D echocardiogram also does not show vegetations.  #2  Hardware associated thoracic spine infection: MRI is reassuring inflammatory  markers are up but also could be due to his pneumonia.  His organism is covered with vancomycin for now.  There is no need for further protracted antibiotics I would plan on discontinue his PICC line while he is in the hospital and transitioning him back to oral Bactrim with regards to his spine  #3 fluid collection identified on imaging:  Discussed with Dr. Louanne Skye and he recommended getting an IR guided aspirate of this area to see if there is pus or seroma present and sent for culture.  He will see the patient later  today.  I spent greater than 35 minutes with the patient including greater than 50% of time in face to face counsel of the patient and and his wife regarding the nature of his hardware associated spine infection his pneumonia, the fluid collection that was recent discovered and in coordination of his care with the primary team and with Dr. Louanne Skye     LOS: 1 day   Luis Brown 11/12/2020, 10:38 AM

## 2020-11-12 NOTE — Progress Notes (Signed)
Progress Note    [name]   [id number]  DOB: 11-22-1945  DOA: 11/10/2020     1  PCP: Elise Benne  CC: SOB  Hospital Course: [name] is a 75 yo male with PMH vertebral OM (was on 6 wk Dapto course PTA), peripheral neuropathy, HLD, HTN, DMII, DDD, BPH, anxiety/depression who presented to the hospital with SOB.  He initially presented to his PCPs office and was found to be hypoxic.  He was referred to the ER.  He underwent work-up with CTA chest which showed a large right sided PNA with ground glass nodules and concern for central cavitation.  He was started on IV antibiotics on admission.  Infectious disease was consulted given prolonged antibiotic course at home and now with atypical pneumonia.  Blood cultures were obtained on admission.  Repeat MRI T-spine and echo were also ordered. The echo was negative for valvular vegetation.  MRI was negative for deep infection but did reveal a subcutaneous probable seroma.  IR was consulted per ID for aspiration/drainage and culturing of fluid.   Interval History:  No events overnight.  States that he feels short of breath when ambulating to the bathroom; reassured him this is not unexpected.  He remains on room air.  Still having some cough and using his flutter valve.  Incentive spirometer not yet in room; reordered again today. We discussed ongoing antibiotics for now and probable transition to oral regimen to finish out pneumonia course and his IV antibiotics for osteomyelitis will also be transitioned to oral Bactrim prior to discharge and PICC line removed.  ROS: Constitutional: negative for chills and fevers, Respiratory: positive for cough, Cardiovascular: negative for chest pain and Gastrointestinal: negative for abdominal pain  Assessment & Plan: * Pneumonia of right lower lobe due to infectious organism - CAP with higher risk MDRO due to prolonged IV abx (dapto) and recent exposure to healthcare - continue vanc,  cefepime. - d/c azithro. Added flagyl 4/27 - ID consulted - CTA read on 4/26 suggests right sided cavitary lesions only? Atypical if this is considered embolic (sources would be cardiac vs PICC) - follow up blood cultures; hold off on PICC cx for now but if spikes fever or PIV cultures positive then may need to remove PICC - MRI T spine shows probable seroma; IR consulted per ID and plan is for aspiration/drainage on 11/13/20 - follow up fluid studies from aspiration - continue flutter and IS - ambulate Qshift; PT ordered   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HCC) - s/p removal of hardware of T5, T6 pedicle screws/rods, exploration of fusion on 09/14/20 - diagnosed with hardware associated Staph Epi OM. Seen by ID on 09/28/20 (prior to this had also been on a prolonged abx course for prior infection). Plan was: 6 weeks Dapto followed by Bactrim up to 3 months if tolerates - seen by ID this admission again; continue on Vanc for now while in hospital  - repeat MRI T-spine 11/11/20: no epidural abscess. New fluid collection in SQ tissue T3-4 to T6-7 measuring 6 cm, considered a seroma (to be aspirated for culture on 4/29) - tentative plan is to complete IV abx inpatient, remove PICC prior to discharge and patient will continue on Bactrim PO at discharge  Sepsis ([place]) - tachycardia, tachypnea, pneumonia source - see CAP  BPH (benign prostatic hyperplasia) - Continue Flomax  Hyperlipidemia - Continue Lipitor  Diabetes mellitus without complication ([place]) - continue SSI and CBG monitoring  - last A1c 6.3%  on 09/08/20  HTN (hypertension) - Continue Avapro  Hyponatremia-resolved as of 11/11/2020 - Na 130 on admission; suspected hypovolemia - improved   Old records reviewed in assessment of this patient  Antimicrobials: Azithromycin 11/11/20 x 1 Vancomycin 11/10/2020 >> current Cefepime 11/10/2020 >> current  Flagyl 11/11/2020 >> current  DVT prophylaxis: enoxaparin (LOVENOX)  injection 40 mg Start: 11/11/20 1000   Code Status:   Code Status: Full Code Family Communication: wife  Disposition Plan: Status is: Inpatient  Remains inpatient appropriate because:Ongoing diagnostic testing needed not appropriate for outpatient work up, IV treatments appropriate due to intensity of illness or inability to take PO and Inpatient level of care appropriate due to severity of illness   Dispo: The patient is from: Home              Anticipated d/c is to: Home              Patient currently is not medically stable to d/c.   Difficult to place patient No  Risk of unplanned readmission score: Unplanned Admission- Pilot do not use: 17.29   Objective: Blood pressure (!) 150/74, pulse (!) 103, temperature 98.2 F (36.8 C), resp. rate 20, height 5\' 10"  (1.778 m), weight 75.9 kg, SpO2 94 %.  Examination: General appearance: alert, cooperative and no distress Head: Normocephalic, without obvious abnormality, atraumatic Eyes: EOMI Lungs: decreased Right sided breath sounds, no wheezing; rhonchi present  Back: palpable mass/fluid like area over mid back in thoracic region approx 4-5 cm long vertically Heart: regular rate and rhythm and S1, S2 normal Abdomen: normal findings: bowel sounds normal and soft, non-tender Extremities: no edema; RUE PICC in place with no surrounding signs of infection Skin: mobility and turgor normal Neurologic: Grossly normal  Consultants:   ID  IR  Procedures:   n/a  Data Reviewed: I have personally reviewed following labs and imaging studies Results for orders placed or performed during the hospital encounter of 11/10/20 (from the past 24 hour(s))  Sedimentation rate     Status: Abnormal   Collection Time: 11/11/20  3:48 PM  Result Value Ref Range   Sed Rate 47 (H) 0 - 16 mm/hr  Glucose, capillary     Status: Abnormal   Collection Time: 11/11/20  5:36 PM  Result Value Ref Range   Glucose-Capillary 137 (H) 70 - 99 mg/dL  Glucose,  capillary     Status: Abnormal   Collection Time: 11/11/20  9:39 PM  Result Value Ref Range   Glucose-Capillary 198 (H) 70 - 99 mg/dL  C-reactive protein     Status: Abnormal   Collection Time: 11/12/20  3:49 AM  Result Value Ref Range   CRP 24.1 (H) <1.0 mg/dL  Procalcitonin     Status: None   Collection Time: 11/12/20  3:49 AM  Result Value Ref Range   Procalcitonin 0.14 ng/mL  Basic metabolic panel     Status: Abnormal   Collection Time: 11/12/20  3:49 AM  Result Value Ref Range   Sodium 135 135 - 145 mmol/L   Potassium 3.6 3.5 - 5.1 mmol/L   Chloride 102 98 - 111 mmol/L   CO2 24 22 - 32 mmol/L   Glucose, Bld 154 (H) 70 - 99 mg/dL   BUN 13 8 - 23 mg/dL   Creatinine, Ser 0.60 (L) 0.61 - 1.24 mg/dL   Calcium 8.3 (L) 8.9 - 10.3 mg/dL   GFR, Estimated >60 >60 mL/min   Anion gap 9 5 - 15  CBC with  Differential/Platelet     Status: Abnormal   Collection Time: 11/12/20  3:49 AM  Result Value Ref Range   WBC 13.0 (H) 4.0 - 10.5 K/uL   RBC 3.68 (L) 4.22 - 5.81 MIL/uL   Hemoglobin 11.2 (L) 13.0 - 17.0 g/dL   HCT 34.0 (L) 39.0 - 52.0 %   MCV 92.4 80.0 - 100.0 fL   MCH 30.4 26.0 - 34.0 pg   MCHC 32.9 30.0 - 36.0 g/dL   RDW 13.6 11.5 - 15.5 %   Platelets 439 (H) 150 - 400 K/uL   nRBC 0.0 0.0 - 0.2 %   Neutrophils Relative % 83 %   Neutro Abs 10.9 (H) 1.7 - 7.7 K/uL   Lymphocytes Relative 3 %   Lymphs Abs 0.3 (L) 0.7 - 4.0 K/uL   Monocytes Relative 10 %   Monocytes Absolute 1.3 (H) 0.1 - 1.0 K/uL   Eosinophils Relative 3 %   Eosinophils Absolute 0.4 0.0 - 0.5 K/uL   Basophils Relative 0 %   Basophils Absolute 0.0 0.0 - 0.1 K/uL   Immature Granulocytes 1 %   Abs Immature Granulocytes 0.06 0.00 - 0.07 K/uL  Magnesium     Status: None   Collection Time: 11/12/20  3:49 AM  Result Value Ref Range   Magnesium 1.9 1.7 - 2.4 mg/dL  Glucose, capillary     Status: Abnormal   Collection Time: 11/12/20  8:03 AM  Result Value Ref Range   Glucose-Capillary 188 (H) 70 - 99 mg/dL   Glucose, capillary     Status: Abnormal   Collection Time: 11/12/20 12:50 PM  Result Value Ref Range   Glucose-Capillary 168 (H) 70 - 99 mg/dL   *Note: Due to a large number of results and/or encounters for the requested time period, some results have not been displayed. A complete set of results can be found in Results Review.    Recent Results (from the past 240 hour(s))  Resp Panel by RT-PCR (Flu A&B, Covid) Nasopharyngeal Swab     Status: None   Collection Time: 11/10/20  4:54 PM   Specimen: Nasopharyngeal Swab; Nasopharyngeal(NP) swabs in vial transport medium  Result Value Ref Range Status   SARS Coronavirus 2 by RT PCR NEGATIVE NEGATIVE Final    Comment: (NOTE) SARS-CoV-2 target nucleic acids are NOT DETECTED.  The SARS-CoV-2 RNA is generally detectable in upper respiratory specimens during the acute phase of infection. The lowest concentration of SARS-CoV-2 viral copies this assay can detect is 138 copies/mL. A negative result does not preclude SARS-Cov-2 infection and should not be used as the sole basis for treatment or other patient management decisions. A negative result may occur with  improper specimen collection/handling, submission of specimen other than nasopharyngeal swab, presence of viral mutation(s) within the areas targeted by this assay, and inadequate number of viral copies(<138 copies/mL). A negative result must be combined with clinical observations, patient history, and epidemiological information. The expected result is Negative.  Fact Sheet for Patients:  EntrepreneurPulse.com.au  Fact Sheet for Healthcare Providers:  IncredibleEmployment.be  This test is no t yet approved or cleared by the [place] FDA and  has been authorized for detection and/or diagnosis of SARS-CoV-2 by FDA under an Emergency Use Authorization (EUA). This EUA will remain  in effect (meaning this test can be used) for the duration of  the COVID-19 declaration under Section 564(b)(1) of the Act, 21 U.S.C.section 360bbb-3(b)(1), unless the authorization is terminated  or revoked sooner.  Influenza A by PCR NEGATIVE NEGATIVE Final   Influenza B by PCR NEGATIVE NEGATIVE Final    Comment: (NOTE) The Xpert Xpress SARS-CoV-2/FLU/RSV plus assay is intended as an aid in the diagnosis of influenza from Nasopharyngeal swab specimens and should not be used as a sole basis for treatment. Nasal washings and aspirates are unacceptable for Xpert Xpress SARS-CoV-2/FLU/RSV testing.  Fact Sheet for Patients: EntrepreneurPulse.com.au  Fact Sheet for Healthcare Providers: IncredibleEmployment.be  This test is not yet approved or cleared by the [place] FDA and has been authorized for detection and/or diagnosis of SARS-CoV-2 by FDA under an Emergency Use Authorization (EUA). This EUA will remain in effect (meaning this test can be used) for the duration of the COVID-19 declaration under Section 564(b)(1) of the Act, 21 U.S.C. section 360bbb-3(b)(1), unless the authorization is terminated or revoked.  Performed at [facility], [address] [place], [place] [address]   Culture, blood (routine x 2)     Status: None (Preliminary result)   Collection Time: 11/10/20  6:04 PM   Specimen: BLOOD  Result Value Ref Range Status   Specimen Description   Final    BLOOD PICC LINE Performed at [facility] [address] [facility], [address] [place], [place] [address]    Special Requests   Final    BOTTLES DRAWN AEROBIC AND ANAEROBIC Blood Culture adequate volume Performed at [facility], [address], [place], [place] [address]    Culture   Final    NO GROWTH 1 DAY Performed at [facility], [place] [address], [place], [place] [address]    Report Status PENDING  Incomplete  Culture, blood (routine x 2)     Status: None (Preliminary result)   Collection Time:  11/10/20  6:05 PM   Specimen: BLOOD LEFT ARM  Result Value Ref Range Status   Specimen Description   Final    BLOOD LEFT ARM Performed at [facility], [address] [place], [place] [address]    Special Requests   Final    BOTTLES DRAWN AEROBIC AND ANAEROBIC Blood Culture adequate volume Performed at [facility], [address], [place], [place] [address]    Culture   Final    NO GROWTH 1 DAY Performed at [facility], [place] [address], [place], [place] [address]    Report Status PENDING  Incomplete     Radiology Studies: CT Angio Chest PE W/Cm &/Or Wo Cm  Result Date: 11/10/2020 CLINICAL DATA:  75 year old male with concern for pulmonary embolism. EXAM: CT ANGIOGRAPHY CHEST WITH CONTRAST TECHNIQUE: Multidetector CT imaging of the chest was performed using the standard protocol during bolus administration of intravenous contrast. Multiplanar CT image reconstructions and MIPs were obtained to evaluate the vascular anatomy. CONTRAST:  42mL OMNIPAQUE IOHEXOL 350 MG/ML SOLN COMPARISON:  Chest radiograph dated 11/10/2020 and CT dated 12/25/2019. FINDINGS: Cardiovascular: There is no cardiomegaly or pericardial effusion. There is coronary vascular calcification of the LAD. The thoracic aorta is unremarkable. The origins of the great vessels of the aortic arch appear patent as visualized. Evaluation of the pulmonary arteries is limited due to respiratory motion artifact and streak artifact caused by spinal metallic hardware. No pulmonary artery embolus identified. Mediastinum/Nodes: Right hilar adenopathy measures 15 mm in short axis. Mildly enlarged lymph node anterior to the trachea measures 10 mm short axis. The esophagus and the thyroid gland are grossly unremarkable. No mediastinal fluid collection.  Right-sided PICC with tip at the cavoatrial junction. Lungs/Pleura: There is background of mild centrilobular emphysema. Large patchy area of interstitial coarsening and  diffuse ground-glass opacity primarily involving the lower lung zone and peripheral and subpleural lung new since the prior CT and most consistent with pneumonia, likely atypical in etiology. Clinical correlation and follow-up to resolution recommended. Several subpleural ground-glass nodules may have central cavitation and therefore septic emboli are not excluded. A 5 mm left lower lobe nodule appears similar to prior CT. There is a small right pleural effusion. No pneumothorax. The central airways are patent. Upper Abdomen: No acute abnormality. Musculoskeletal: Degenerative changes, osteopenia, and extensive spinal fusion. No acute osseous pathology. Review of the MIP images confirms the above findings. IMPRESSION: 1. No CT evidence of pulmonary embolism. 2. Findings most consistent with pneumonia, likely atypical in etiology. Clinical correlation and follow-up to resolution recommended. 3. Small right pleural effusion. 4. Aortic Atherosclerosis (ICD10-I70.0) and Emphysema (ICD10-J43.9). Electronically Signed   By: Anner Crete M.D.   On: 11/10/2020 18:32   MR THORACIC SPINE W WO CONTRAST  Result Date: 11/11/2020 CLINICAL DATA:  Epidural abscess. EXAM: MRI THORACIC WITHOUT AND WITH CONTRAST TECHNIQUE: Multiplanar and multiecho pulse sequences of the thoracic spine were obtained without and with intravenous contrast. CONTRAST:  61mL GADAVIST GADOBUTROL 1 MMOL/ML IV SOLN COMPARISON:  MRI of the thoracic spine April 23, 2020 FINDINGS: Alignment:  Maintained. Vertebrae: Postsurgical changes from posterior fixation from T7 through the visualized upper lumbar spine with interval removal of T5 and T6 posterior hardware. Susceptibility artifact from the hardware limits evaluation, particularly in the lower thoracic region. Endplate destructive changes at T4-5 appear stable with improved marrow edema. Stable appearance of the T9-10 effusion. No new focus suspicious for discitis/osteomyelitis. No evidence of an  epidural abscess. Cord: Evaluation limited by susceptibility artifact. No obvious cord lesion. Paraspinal and other soft tissues: New fluid collection in the subcutaneous from the T3-4 through the T6-7 level measuring up to 6 cm in length by 2.3 x 5.9 largest transverse and anteroposterior dimensions, likely related to seroma. Extensive right lung consolidation better described on recent chest CT. Disc levels: No high-grade spinal canal stenosis from T1-2 through T9-10. T10-11 and T11-12 levels are obscured by susceptibility artifact. No neural foraminal narrowing from T1-2 through T7-8. Evaluation of the neural foramen below T7-8 is limited by susceptibility artifact. IMPRESSION: 1. Postsurgical changes from posterior fixation from T7 through the visualized upper lumbar spine with interval removal of T5 and T6 posterior hardware. Susceptibility artifact from the hardware limits evaluation, particularly in the lower thoracic region. 2. Endplate destructive changes at T4-5 appear stable with improved marrow edema. Stable appearance of the T9-10 effusion. No evidence of an epidural abscess. 3. New fluid collection in the subcutaneous from the T3-4 through the T6-7 level measuring up to 6 cm in length by 2.3 x 5.9 cm, likely a seroma. 4. Extensive right lung consolidation better described on recent chest CT. Electronically Signed   By: Pedro Earls M.D.   On: 11/11/2020 13:47   DG Chest Port 1 View  Result Date: 11/10/2020 CLINICAL DATA:  75 year old male with shortness of breath. EXAM: PORTABLE CHEST 1 VIEW COMPARISON:  Chest radiograph dated 01/07/2020. FINDINGS: Right-sided PICC with tip over the central SVC close to the cavoatrial junction. Right-sided pulmonary opacities predominantly involving the peripheral and subpleural lung, new since the prior radiograph, and most consistent with pneumonia and possibly viral or atypical in etiology. Clinical correlation and follow-up to resolution  is  recommended. The left lung is clear. No pleural effusion or pneumothorax. The cardiac silhouette is within limits. Spinal fusion hardware. No acute osseous pathology. IMPRESSION: Right-sided pulmonary opacities most consistent with pneumonia. Clinical correlation and follow-up to resolution is recommended. Electronically Signed   By: Anner Crete M.D.   On: 11/10/2020 17:25   ECHOCARDIOGRAM COMPLETE  Result Date: 11/11/2020    ECHOCARDIOGRAM REPORT   Patient Name:   ARTH NICASTRO Date of Exam: 11/11/2020 Medical Rec #:  [id number]        Height:       70.0 in Accession #:    [id number]       Weight:       167.3 lb Date of Birth:  April 08, 1946         BSA:          1.935 m Patient Age:    14 years         BP:           114/68 mmHg Patient Gender: M                HR:           102 bpm. Exam Location:  Inpatient Procedure: 2D Echo Indications:    Bacteremia  History:        Patient has no prior history of Echocardiogram examinations.                 Risk Factors:Hypertension, Dyslipidemia and Diabetes.  Sonographer:    Mikki Santee RDCS (AE) Referring Phys: [place]  1. Left ventricular ejection fraction, by estimation, is 60 to 65%. The left ventricle has normal function. The left ventricle has no regional wall motion abnormalities. Left ventricular diastolic parameters were normal.  2. Right ventricular systolic function is normal. The right ventricular size is mildly enlarged.  3. The mitral valve is normal in structure. No evidence of mitral valve regurgitation.  4. The aortic valve was not well visualized. Aortic valve regurgitation is not visualized. No aortic stenosis is present.  5. The inferior vena cava is normal in size with greater than 50% respiratory variability, suggesting right atrial pressure of 3 mmHg. Conclusion(s)/Recommendation(s): No vegetation seen. If high clinical suspicion for endocarditis consider TEE. FINDINGS  Left Ventricle: Left ventricular ejection  fraction, by estimation, is 60 to 65%. The left ventricle has normal function. The left ventricle has no regional wall motion abnormalities. The left ventricular internal cavity size was normal in size. There is  no left ventricular hypertrophy. Left ventricular diastolic parameters were normal. Right Ventricle: The right ventricular size is mildly enlarged. No increase in right ventricular wall thickness. Right ventricular systolic function is normal. Left Atrium: Left atrial size was normal in size. Right Atrium: Right atrial size was normal in size. Pericardium: Trivial pericardial effusion is present. Mitral Valve: The mitral valve is normal in structure. No evidence of mitral valve regurgitation. Tricuspid Valve: The tricuspid valve is normal in structure. Tricuspid valve regurgitation is not demonstrated. Aortic Valve: The aortic valve was not well visualized. Aortic valve regurgitation is not visualized. No aortic stenosis is present. Pulmonic Valve: The pulmonic valve was not well visualized. Pulmonic valve regurgitation is not visualized. Aorta: The aortic root is normal in size and structure. Venous: The inferior vena cava is normal in size with greater than 50% respiratory variability, suggesting right atrial pressure of 3 mmHg. IAS/Shunts: The interatrial septum was not well visualized.  LEFT VENTRICLE  PLAX 2D LVIDd:         4.70 cm  Diastology LVIDs:         2.80 cm  LV e' medial:    7.83 cm/s LV PW:         0.80 cm  LV E/e' medial:  11.5 LV IVS:        0.80 cm  LV e' lateral:   8.49 cm/s LVOT diam:     2.10 cm  LV E/e' lateral: 10.6 LV SV:         85 LV SV Index:   44 LVOT Area:     3.46 cm  RIGHT VENTRICLE RV S prime:     11.80 cm/s TAPSE (M-mode): 1.1 cm LEFT ATRIUM           Index       RIGHT ATRIUM           Index LA diam:      3.20 cm 1.65 cm/m  RA Area:     13.30 cm LA Vol (A2C): 52.2 ml 26.98 ml/m RA Volume:   27.80 ml  14.37 ml/m LA Vol (A4C): 37.0 ml 19.12 ml/m  AORTIC VALVE LVOT Vmax:    147.00 cm/s LVOT Vmean:  92.500 cm/s LVOT VTI:    0.246 m  AORTA Ao Root diam: 3.20 cm MITRAL VALVE MV Area (PHT): 5.42 cm    SHUNTS MV Decel Time: 140 msec    Systemic VTI:  0.25 m MV E velocity: 89.70 cm/s  Systemic Diam: 2.10 cm MV A velocity: 88.60 cm/s MV E/A ratio:  1.01 Oswaldo Milian MD Electronically signed by Oswaldo Milian MD Signature Date/Time: 11/11/2020/6:43:20 PM    Final    MR THORACIC SPINE W WO CONTRAST  Final Result    CT Angio Chest PE W/Cm &/Or Wo Cm  Final Result    DG Chest Port 1 View  Final Result    [place] FNA SOFT TISSUE    (Results Pending)    Scheduled Meds: . atorvastatin  10 mg Oral Daily  . busPIRone  7.5 mg Oral BID  . Chlorhexidine Gluconate Cloth  6 each Topical Daily  . enoxaparin (LOVENOX) injection  40 mg Subcutaneous Q24H  . famotidine  20 mg Oral Daily  . Ferrous Fumarate  1 tablet Oral Daily  . insulin aspart  0-9 Units Subcutaneous TID WC  . irbesartan  37.5 mg Oral Daily  . metroNIDAZOLE  500 mg Oral Q8H  . pantoprazole  40 mg Oral BID AC  . pregabalin  75 mg Oral BID  . sucralfate  1 g Oral TID WC & HS  . tamsulosin  0.8 mg Oral Daily  . venlafaxine XR  150 mg Oral Q breakfast   PRN Meds: albuterol, HYDROcodone-acetaminophen, ipratropium-albuterol, sodium chloride flush, tiZANidine, zolpidem Continuous Infusions: . ceFEPime (MAXIPIME) IV Stopped (11/12/20 1210)  . vancomycin 1,000 mg (11/12/20 0639)     LOS: 1 day  Time spent: Greater than 50% of the 35 minute visit was spent in counseling/coordination of care for the patient as laid out in the A&P.   Dwyane Dee, MD [facility] 11/12/2020, 3:03 PM

## 2020-11-12 NOTE — Progress Notes (Signed)
IR was requested for aspiration of a subcutaneous fluid collection in T spine.   Unfortunately, IR was not able to accommodate the procedure today due to urgent cases.  Will be done in IR around 8-8:30 am tomorrow.  RN notified.  No sedation is needed.  Lovenox held.  Patient seen by PA today, consent in chart.   Tentatively scheduled for tomorrow around 8-8:30 am in IR.    Armando Gang Ivry Pigue PA-C 11/12/2020 5:15 PM

## 2020-11-13 ENCOUNTER — Inpatient Hospital Stay (HOSPITAL_COMMUNITY): Payer: Medicare Other

## 2020-11-13 DIAGNOSIS — B957 Other staphylococcus as the cause of diseases classified elsewhere: Secondary | ICD-10-CM | POA: Diagnosis not present

## 2020-11-13 DIAGNOSIS — M4635 Infection of intervertebral disc (pyogenic), thoracolumbar region: Secondary | ICD-10-CM | POA: Diagnosis not present

## 2020-11-13 DIAGNOSIS — M4325 Fusion of spine, thoracolumbar region: Secondary | ICD-10-CM | POA: Diagnosis not present

## 2020-11-13 DIAGNOSIS — R0902 Hypoxemia: Secondary | ICD-10-CM

## 2020-11-13 DIAGNOSIS — J189 Pneumonia, unspecified organism: Secondary | ICD-10-CM | POA: Diagnosis not present

## 2020-11-13 DIAGNOSIS — T847XXD Infection and inflammatory reaction due to other internal orthopedic prosthetic devices, implants and grafts, subsequent encounter: Secondary | ICD-10-CM | POA: Diagnosis not present

## 2020-11-13 DIAGNOSIS — L089 Local infection of the skin and subcutaneous tissue, unspecified: Secondary | ICD-10-CM

## 2020-11-13 DIAGNOSIS — T148XXA Other injury of unspecified body region, initial encounter: Secondary | ICD-10-CM

## 2020-11-13 DIAGNOSIS — T888XXA Other specified complications of surgical and medical care, not elsewhere classified, initial encounter: Secondary | ICD-10-CM

## 2020-11-13 DIAGNOSIS — G062 Extradural and subdural abscess, unspecified: Secondary | ICD-10-CM | POA: Diagnosis not present

## 2020-11-13 DIAGNOSIS — J90 Pleural effusion, not elsewhere classified: Secondary | ICD-10-CM | POA: Diagnosis not present

## 2020-11-13 HISTORY — PX: IR US GUIDE BX ASP/DRAIN: IMG2392

## 2020-11-13 LAB — CBC WITH DIFFERENTIAL/PLATELET
Abs Immature Granulocytes: 0.11 10*3/uL — ABNORMAL HIGH (ref 0.00–0.07)
Basophils Absolute: 0 10*3/uL (ref 0.0–0.1)
Basophils Relative: 0 %
Eosinophils Absolute: 0.7 10*3/uL — ABNORMAL HIGH (ref 0.0–0.5)
Eosinophils Relative: 4 %
HCT: 37 % — ABNORMAL LOW (ref 39.0–52.0)
Hemoglobin: 12.3 g/dL — ABNORMAL LOW (ref 13.0–17.0)
Immature Granulocytes: 1 %
Lymphocytes Relative: 3 %
Lymphs Abs: 0.6 10*3/uL — ABNORMAL LOW (ref 0.7–4.0)
MCH: 30.3 pg (ref 26.0–34.0)
MCHC: 33.2 g/dL (ref 30.0–36.0)
MCV: 91.1 fL (ref 80.0–100.0)
Monocytes Absolute: 1.6 10*3/uL — ABNORMAL HIGH (ref 0.1–1.0)
Monocytes Relative: 9 %
Neutro Abs: 14 10*3/uL — ABNORMAL HIGH (ref 1.7–7.7)
Neutrophils Relative %: 83 %
Platelets: 396 10*3/uL (ref 150–400)
RBC: 4.06 MIL/uL — ABNORMAL LOW (ref 4.22–5.81)
RDW: 13.8 % (ref 11.5–15.5)
WBC: 17.1 10*3/uL — ABNORMAL HIGH (ref 4.0–10.5)
nRBC: 0 % (ref 0.0–0.2)

## 2020-11-13 LAB — RESPIRATORY PANEL BY PCR

## 2020-11-13 LAB — GLUCOSE, CAPILLARY
Glucose-Capillary: 181 mg/dL — ABNORMAL HIGH (ref 70–99)
Glucose-Capillary: 234 mg/dL — ABNORMAL HIGH (ref 70–99)
Glucose-Capillary: 224 mg/dL — ABNORMAL HIGH (ref 70–99)
Glucose-Capillary: 163 mg/dL — ABNORMAL HIGH (ref 70–99)

## 2020-11-13 LAB — BASIC METABOLIC PANEL
Anion gap: 11 (ref 5–15)
BUN: 14 mg/dL (ref 8–23)
CO2: 24 mmol/L (ref 22–32)
Calcium: 8.6 mg/dL — ABNORMAL LOW (ref 8.9–10.3)
Chloride: 100 mmol/L (ref 98–111)
Creatinine, Ser: 0.68 mg/dL (ref 0.61–1.24)
GFR, Estimated: >60 mL/min (ref >60)
Glucose, Bld: 169 mg/dL — ABNORMAL HIGH (ref 70–99)
Potassium: 3.6 mmol/L (ref 3.5–5.1)
Sodium: 135 mmol/L (ref 135–145)

## 2020-11-13 LAB — PROCALCITONIN: Procalcitonin: 0.23 ng/mL

## 2020-11-13 LAB — MAGNESIUM: Magnesium: 2.1 mg/dL (ref 1.7–2.4)

## 2020-11-13 MED ORDER — LIDOCAINE HCL (PF) 1 % IJ SOLN
INTRAMUSCULAR | Status: AC
  Filled 2020-11-13: qty 30

## 2020-11-13 MED ORDER — IPRATROPIUM-ALBUTEROL 0.5-2.5 (3) MG/3ML IN SOLN
3.0000 mL | Freq: Three times a day (TID) | RESPIRATORY_TRACT | Status: DC
  Administered 2020-11-13 – 2020-11-23 (×30): 3 mL via RESPIRATORY_TRACT
  Filled 2020-11-13 (×30): qty 3

## 2020-11-13 MED ORDER — SULFAMETHOXAZOLE-TRIMETHOPRIM 800-160 MG PO TABS: 1.0000 | ORAL_TABLET | Freq: Two times a day (BID) | ORAL | Status: DC

## 2020-11-13 MED ORDER — IPRATROPIUM-ALBUTEROL 0.5-2.5 (3) MG/3ML IN SOLN
3.0000 mL | Freq: Four times a day (QID) | RESPIRATORY_TRACT | Status: DC
  Administered 2020-11-13: 3 mL via RESPIRATORY_TRACT
  Filled 2020-11-13: qty 3

## 2020-11-13 NOTE — Procedures (Signed)
Interventional Radiology Procedure Note  Procedure: [place] ASP POSTERIOR THORACIC SMALL FLD COLLECTION    Complications: None  Estimated Blood Loss:  0  Findings: 3 CC THIN AMBER SEROSANG FLD CX SENT    Tamera Punt, MD

## 2020-11-13 NOTE — Evaluation (Signed)
Physical Therapy Evaluation Patient Details Name: Luis Brown MRN: [id number] DOB: Oct 28, 1945 Today's Date: 11/13/2020   History of Present Illness  Pt is 75 yo male who presented to ED with hypoxia on 11/10/20 and admitted with atypical pnemonia.  Additionally, pt had MRI T spine and found to have subcutaneous seroma and is s/p aspiration on 11/13/20.  He has PMH including recent vertebral osteomyelitis requiring removal of hardware on 09/14/20 still on antibiotics, peripheral neuropathy, HLC, HTN, DM, DDD, BPH, anxiety and depression.  Clinical Impression  Pt admitted with above diagnosis. Pt is very pleasant and motivated gentleman.  He was very independent and active at baseline. With PT evaluation, pt did have some mild unsteadiness but overall mainly limited due to cardiopulmonary status.  On RA , sats dropping to 79% just with standing (good pleth), on 3 L O2 sats were 86% with 25' of ambulation with DOE of 3/4.  Pt was educated on current O2 need, gradual increase in endurance, and breathing technique. Pt currently with functional limitations due to the deficits listed below (see PT Problem List). Pt will benefit from skilled PT to increase their independence and safety with mobility to allow discharge to the venue listed below.       Follow Up Recommendations Outpatient PT    Equipment Recommendations  Other (comment) (possible rollator and home O2 pending progress)    Recommendations for Other Services       Precautions / Restrictions Precautions Precautions: Fall Precaution Comments: watch sats      Mobility  Bed Mobility Overal bed mobility: Independent                  Transfers Overall transfer level: Needs assistance Equipment used: None Transfers: Sit to/from Stand Sit to Stand: Min guard         General transfer comment: min guard for safety; pt has been moving around room without assist but does have some unsteadiness due to  neuropathy  Ambulation/Gait Ambulation/Gait assistance: Min guard Gait Distance (Feet): 25 Feet (25'x2) Assistive device: None Gait Pattern/deviations: Step-through pattern;Decreased stride length;Wide base of support Gait velocity: decreased   General Gait Details: mild unsteadiness; limited by O2 sats and DOE (see general comments)  Stairs            Wheelchair Mobility    Modified Rankin (Stroke Patients Only)       Balance Overall balance assessment: Needs assistance   Sitting balance-Leahy Scale: Normal     Standing balance support: No upper extremity supported Standing balance-Leahy Scale: Fair                               Pertinent Vitals/Pain Pain Assessment: 0-10 Pain Score: 5  Pain Location: back Pain Descriptors / Indicators: Discomfort Pain Intervention(s): Limited activity within patient's tolerance;Monitored during session    Home Living Family/patient expects to be discharged to:: Private residence Living Arrangements: Spouse/significant other Available Help at Discharge: Family;Available 24 hours/day Type of Home: House Home Access: Stairs to enter Entrance Stairs-Rails: Right Entrance Stairs-Number of Steps: 2 Home Layout: One level Home Equipment: Walker - 2 wheels;Cane - single point;Bedside commode      Prior Function Level of Independence: Independent         Comments: Enjoys yard work     Journalist, newspaper   Dominant Hand: Right    Extremity/Trunk Assessment   Upper Extremity Assessment Upper Extremity Assessment: Overall WFL for tasks assessed  Lower Extremity Assessment Lower Extremity Assessment: Overall WFL for tasks assessed    Cervical / Trunk Assessment Cervical / Trunk Assessment: Normal  Communication   Communication: No difficulties  Cognition Arousal/Alertness: Awake/alert Behavior During Therapy: WFL for tasks assessed/performed Overall Cognitive Status: Within Functional Limits for tasks  assessed                                        General Comments General comments (skin integrity, edema, etc.): Pt on RA at rest but had been given 1 L [place] to use as needed.  At rest on RA sats were 90%.  Just with standing with mask donned to ambulate, sat dropped to 79%.  Had pt return to sitting, sats still reading 82% with good pleth.  Donned 2 L of O2 and sats up to 90% after ~3 mins.  Then ambulated 25' on 2 L with sats down to 83%.  Provided 3 min standing rest break and O2 on 3 L and sats up to 92%.  Ambulated back 25' on 3 L with sats 86% and recovering in 1 min.  Pt with DOE of 4/4 on RA and 3/4 on O2 with activity.  Educated pt on PT role, POC, gradual increase in activity, pursed lip breathing, and current need for O2. Pt is normally very independent and active and this was hard for him to hear he needs O2 at this time but he listened and agreed.    Exercises     Assessment/Plan    PT Assessment Patient needs continued PT services  PT Problem List Decreased strength;Decreased mobility;Decreased safety awareness;Decreased knowledge of precautions;Decreased activity tolerance;Cardiopulmonary status limiting activity;Decreased balance;Decreased knowledge of use of DME       PT Treatment Interventions DME instruction;Therapeutic activities;Gait training;Therapeutic exercise;Patient/family education;Stair training;Functional mobility training;Modalities;Balance training    PT Goals (Current goals can be found in the Care Plan section)  Acute Rehab PT Goals Patient Stated Goal: return home; get back to normal and off O2 PT Goal Formulation: With patient/family Time For Goal Achievement: 11/27/20 Potential to Achieve Goals: Good    Frequency Min 3X/week   Barriers to discharge        Co-evaluation               AM-PAC PT "6 Clicks" Mobility  Outcome Measure Help needed turning from your back to your side while in a flat bed without using bedrails?:  None Help needed moving from lying on your back to sitting on the side of a flat bed without using bedrails?: None Help needed moving to and from a bed to a chair (including a wheelchair)?: A Little Help needed standing up from a chair using your arms (e.g., wheelchair or bedside chair)?: A Little Help needed to walk in hospital room?: A Little Help needed climbing 3-5 steps with a railing? : A Little 6 Click Score: 20    End of Session Equipment Utilized During Treatment: Gait belt;Oxygen Activity Tolerance: Patient tolerated treatment well Patient left: in bed;with call bell/phone within reach;with family/visitor present Nurse Communication: Mobility status PT Visit Diagnosis: Other abnormalities of gait and mobility (R26.89);Unsteadiness on feet (R26.81)    Time: 1550-1620 PT Time Calculation (min) (ACUTE ONLY): 30 min   Charges:   PT Evaluation $PT Eval Moderate Complexity: 1 Mod PT Treatments $Therapeutic Activity: 8-22 mins        Abran Richard, PT Acute Rehab Services  Pager [phone] [facility] 11/13/2020, 4:48 PM

## 2020-11-13 NOTE — Progress Notes (Signed)
Progress Note    Luis Brown   [id number]  DOB: [birth year]  DOA: 11/10/2020     2  PCP: Elise Benne  CC: SOB  Hospital Course: Luis Brown is a 75 yo male with PMH vertebral OM (was on 6 wk Dapto course PTA), peripheral neuropathy, HLD, HTN, DMII, DDD, BPH, anxiety/depression who presented to the hospital with SOB.  He initially presented to his PCPs office and was found to be hypoxic.  He was referred to the ER.  He underwent work-up with CTA chest which showed a large right sided PNA with ground glass nodules and concern for central cavitation.  He was started on IV antibiotics on admission.  Infectious disease was consulted given prolonged antibiotic course at home and now with atypical pneumonia.  Blood cultures were obtained on admission.  Repeat MRI T-spine and echo were also ordered. The echo was negative for valvular vegetation.  MRI was negative for deep infection but did reveal a subcutaneous probable seroma.  IR was consulted per ID for aspiration/drainage and culturing of fluid.  He underwent aspiration on 11/13/2020.  Fluid studies were sent for culture.   Interval History:  Feeling better this morning in general.  Wife also bedside this morning.  He had already gone down for aspiration of his fluid collection which revealed small amount of serosanguineous fluid, no obvious pus. Still short of breath with exertion which I explained would take at least a another couple weeks prior to tremendously improving.  He is still on room air however and doing okay from that standpoint; wishing to shower today which I stated should be okay off telemetry.  ROS: Constitutional: negative for chills and fevers, Respiratory: positive for cough, Cardiovascular: negative for chest pain and Gastrointestinal: negative for abdominal pain  Assessment & Plan: * Pneumonia of right lower lobe due to infectious organism - CAP with higher risk MDRO due to prolonged IV abx (dapto) and  recent exposure to healthcare - continue vanc, cefepime. - d/c azithro. Added flagyl 4/27 - ID consulted - CTA read on 4/26 suggests right sided cavitary lesions only? Atypical if this is considered embolic (sources would be cardiac vs PICC) - follow up blood cultures; hold off on PICC cx for now but if spikes fever or PIV cultures positive then may need to remove PICC - MRI T spine shows probable seroma; IR consulted per ID and plan is for aspiration/drainage on 11/13/20 - follow up fluid studies from aspiration - continue flutter and IS - ambulate Qshift; PT ordered  - will complete PO abx course at discharge  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HCC) - s/p removal of hardware of T5, T6 pedicle screws/rods, exploration of fusion on 09/14/20 - diagnosed with hardware associated Staph Epi OM. Seen by ID on 09/28/20 (prior to this had also been on a prolonged abx course for prior infection). Plan was: 6 weeks Dapto followed by Bactrim up to 3 months if tolerates - seen by ID this admission again; continue on Vanc for now while in hospital  - repeat MRI T-spine 11/11/20: no epidural abscess. New fluid collection in SQ tissue T3-4 to T6-7 measuring 6 cm, considered a seroma (to be aspirated for culture on 4/29) - tentative plan is to complete IV abx inpatient, remove PICC prior to discharge and patient will continue on Bactrim PO at discharge  Sepsis ([place]) - tachycardia, tachypnea, pneumonia source - see CAP  BPH (benign prostatic hyperplasia) - Continue Flomax  Hyperlipidemia - Continue Lipitor  Diabetes mellitus without complication (HCC) - continue SSI and CBG monitoring  - last A1c 6.3% on 09/08/20  HTN (hypertension) - Continue Avapro  Hyponatremia-resolved as of 11/11/2020 - Na 130 on admission; suspected hypovolemia - improved   Old records reviewed in assessment of this patient  Antimicrobials: Azithromycin 11/11/20 x 1 Vancomycin 11/10/2020 >> current Cefepime  11/10/2020 >> current  Flagyl 11/11/2020 >> current  DVT prophylaxis: enoxaparin (LOVENOX) injection 40 mg Start: 11/11/20 1000   Code Status:   Code Status: Full Code Family Communication: wife  Disposition Plan: Status is: Inpatient  Remains inpatient appropriate because:Ongoing diagnostic testing needed not appropriate for outpatient work up, IV treatments appropriate due to intensity of illness or inability to take PO and Inpatient level of care appropriate due to severity of illness   Dispo: The patient is from: Home              Anticipated d/c is to: Home              Patient currently is not medically stable to d/c.   Difficult to place patient No  Risk of unplanned readmission score: Unplanned Admission- Pilot do not use: 17.5   Objective: Blood pressure (!) 150/71, pulse 99, temperature 98 F (36.7 C), temperature source Oral, resp. rate 18, height 5\' 10"  (1.778 m), weight 75.9 kg, SpO2 94 %.  Examination: General appearance: alert, cooperative and no distress Head: Normocephalic, without obvious abnormality, atraumatic Eyes: EOMI Lungs: Breath sounds better on right lung fields with significant rhonchi noted.  No obvious wheezing.  Heart: regular rate and rhythm and S1, S2 normal Abdomen: normal findings: bowel sounds normal and soft, non-tender Extremities: no edema; RUE PICC in place with no surrounding signs of infection Skin: mobility and turgor normal Neurologic: Grossly normal  Consultants:   ID  IR  Procedures:   Thoracic fluid collection drainage with IR: 11/13/2020  Data Reviewed: I have personally reviewed following labs and imaging studies Results for orders placed or performed during the hospital encounter of 11/10/20 (from the past 24 hour(s))  Glucose, capillary     Status: Abnormal   Collection Time: 11/12/20 12:50 PM  Result Value Ref Range   Glucose-Capillary 168 (H) 70 - 99 mg/dL  Respiratory (~59~20 pathogens) panel by PCR     Status: None    Collection Time: 11/12/20  1:51 PM  Result Value Ref Range   Adenovirus NOT DETECTED NOT DETECTED   Coronavirus 229E NOT DETECTED NOT DETECTED   Coronavirus HKU1 NOT DETECTED NOT DETECTED   Coronavirus NL63 NOT DETECTED NOT DETECTED   Coronavirus OC43 NOT DETECTED NOT DETECTED   Metapneumovirus NOT DETECTED NOT DETECTED   Rhinovirus / Enterovirus NOT DETECTED NOT DETECTED   Influenza A NOT DETECTED NOT DETECTED   Influenza B NOT DETECTED NOT DETECTED   Parainfluenza Virus 1 NOT DETECTED NOT DETECTED   Parainfluenza Virus 2 NOT DETECTED NOT DETECTED   Parainfluenza Virus 3 NOT DETECTED NOT DETECTED   Parainfluenza Virus 4 NOT DETECTED NOT DETECTED   Respiratory Syncytial Virus NOT DETECTED NOT DETECTED   Bordetella pertussis NOT DETECTED NOT DETECTED   Bordetella Parapertussis NOT DETECTED NOT DETECTED   Chlamydophila pneumoniae NOT DETECTED NOT DETECTED   Mycoplasma pneumoniae NOT DETECTED NOT DETECTED  Glucose, capillary     Status: Abnormal   Collection Time: 11/12/20  5:10 PM  Result Value Ref Range   Glucose-Capillary 124 (H) 70 - 99 mg/dL  Glucose, capillary     Status: Abnormal  Collection Time: 11/12/20 10:42 PM  Result Value Ref Range   Glucose-Capillary 222 (H) 70 - 99 mg/dL  Procalcitonin     Status: None   Collection Time: 11/13/20  3:51 AM  Result Value Ref Range   Procalcitonin 0.23 ng/mL  Basic metabolic panel     Status: Abnormal   Collection Time: 11/13/20  3:51 AM  Result Value Ref Range   Sodium 135 135 - 145 mmol/L   Potassium 3.6 3.5 - 5.1 mmol/L   Chloride 100 98 - 111 mmol/L   CO2 24 22 - 32 mmol/L   Glucose, Bld 169 (H) 70 - 99 mg/dL   BUN 14 8 - 23 mg/dL   Creatinine, Ser 0.68 0.61 - 1.24 mg/dL   Calcium 8.6 (L) 8.9 - 10.3 mg/dL   GFR, Estimated >60 >60 mL/min   Anion gap 11 5 - 15  CBC with Differential/Platelet     Status: Abnormal   Collection Time: 11/13/20  3:51 AM  Result Value Ref Range   WBC 17.1 (H) 4.0 - 10.5 K/uL   RBC 4.06 (L)  4.22 - 5.81 MIL/uL   Hemoglobin 12.3 (L) 13.0 - 17.0 g/dL   HCT 37.0 (L) 39.0 - 52.0 %   MCV 91.1 80.0 - 100.0 fL   MCH 30.3 26.0 - 34.0 pg   MCHC 33.2 30.0 - 36.0 g/dL   RDW 13.8 11.5 - 15.5 %   Platelets 396 150 - 400 K/uL   nRBC 0.0 0.0 - 0.2 %   Neutrophils Relative % 83 %   Neutro Abs 14.0 (H) 1.7 - 7.7 K/uL   Lymphocytes Relative 3 %   Lymphs Abs 0.6 (L) 0.7 - 4.0 K/uL   Monocytes Relative 9 %   Monocytes Absolute 1.6 (H) 0.1 - 1.0 K/uL   Eosinophils Relative 4 %   Eosinophils Absolute 0.7 (H) 0.0 - 0.5 K/uL   Basophils Relative 0 %   Basophils Absolute 0.0 0.0 - 0.1 K/uL   Immature Granulocytes 1 %   Abs Immature Granulocytes 0.11 (H) 0.00 - 0.07 K/uL  Magnesium     Status: None   Collection Time: 11/13/20  3:51 AM  Result Value Ref Range   Magnesium 2.1 1.7 - 2.4 mg/dL  Glucose, capillary     Status: Abnormal   Collection Time: 11/13/20  9:24 AM  Result Value Ref Range   Glucose-Capillary 181 (H) 70 - 99 mg/dL   *Note: Due to a large number of results and/or encounters for the requested time period, some results have not been displayed. A complete set of results can be found in Results Review.    Recent Results (from the past 240 hour(s))  Resp Panel by RT-PCR (Flu A&B, Covid) Nasopharyngeal Swab     Status: None   Collection Time: 11/10/20  4:54 PM   Specimen: Nasopharyngeal Swab; Nasopharyngeal(NP) swabs in vial transport medium  Result Value Ref Range Status   SARS Coronavirus 2 by RT PCR NEGATIVE NEGATIVE Final    Comment: (NOTE) SARS-CoV-2 target nucleic acids are NOT DETECTED.  The SARS-CoV-2 RNA is generally detectable in upper respiratory specimens during the acute phase of infection. The lowest concentration of SARS-CoV-2 viral copies this assay can detect is 138 copies/mL. A negative result does not preclude SARS-Cov-2 infection and should not be used as the sole basis for treatment or other patient management decisions. A negative result may occur with   improper specimen collection/handling, submission of specimen other than nasopharyngeal swab, presence of viral  mutation(s) within the areas targeted by this assay, and inadequate number of viral copies(<138 copies/mL). A negative result must be combined with clinical observations, patient history, and epidemiological information. The expected result is Negative.  Fact Sheet for Patients:  EntrepreneurPulse.com.au  Fact Sheet for Healthcare Providers:  IncredibleEmployment.be  This test is no t yet approved or cleared by the [place] FDA and  has been authorized for detection and/or diagnosis of SARS-CoV-2 by FDA under an Emergency Use Authorization (EUA). This EUA will remain  in effect (meaning this test can be used) for the duration of the COVID-19 declaration under Section 564(b)(1) of the Act, 21 U.S.C.section 360bbb-3(b)(1), unless the authorization is terminated  or revoked sooner.       Influenza A by PCR NEGATIVE NEGATIVE Final   Influenza B by PCR NEGATIVE NEGATIVE Final    Comment: (NOTE) The Xpert Xpress SARS-CoV-2/FLU/RSV plus assay is intended as an aid in the diagnosis of influenza from Nasopharyngeal swab specimens and should not be used as a sole basis for treatment. Nasal washings and aspirates are unacceptable for Xpert Xpress SARS-CoV-2/FLU/RSV testing.  Fact Sheet for Patients: EntrepreneurPulse.com.au  Fact Sheet for Healthcare Providers: IncredibleEmployment.be  This test is not yet approved or cleared by the [place] FDA and has been authorized for detection and/or diagnosis of SARS-CoV-2 by FDA under an Emergency Use Authorization (EUA). This EUA will remain in effect (meaning this test can be used) for the duration of the COVID-19 declaration under Section 564(b)(1) of the Act, 21 U.S.C. section 360bbb-3(b)(1), unless the authorization is terminated  or revoked.  Performed at [facility], [address] [place], [place] [address]   Culture, blood (routine x 2)     Status: None (Preliminary result)   Collection Time: 11/10/20  6:04 PM   Specimen: BLOOD  Result Value Ref Range Status   Specimen Description   Final    BLOOD PICC LINE Performed at [facility], [address] [place], [place] [address]    Special Requests   Final    BOTTLES DRAWN AEROBIC AND ANAEROBIC Blood Culture adequate volume Performed at [facility], [address] [place], [place] [address]    Culture   Final    NO GROWTH 2 DAYS Performed at [facility], [place] [address] [place], [place] [address]    Report Status PENDING  Incomplete  Culture, blood (routine x 2)     Status: None (Preliminary result)   Collection Time: 11/10/20  6:05 PM   Specimen: BLOOD LEFT ARM  Result Value Ref Range Status   Specimen Description   Final    BLOOD LEFT ARM Performed at [facility], [address] [place], [place] [address]    Special Requests   Final    BOTTLES DRAWN AEROBIC AND ANAEROBIC Blood Culture adequate volume Performed at [facility], [address] [place], [place] [address]    Culture   Final    NO GROWTH 2 DAYS Performed at [facility], [place] [address], [place],  [address]    Report Status PENDING  Incomplete  Respiratory (~20 pathogens) panel by PCR     Status: None   Collection Time: 11/12/20  1:51 PM  Result Value Ref Range Status   Adenovirus NOT DETECTED NOT DETECTED Final   Coronavirus 229E NOT DETECTED NOT DETECTED Final    Comment: (NOTE) The Coronavirus on the Respiratory  Panel, DOES NOT test for the novel  Coronavirus (2019 nCoV)    Coronavirus HKU1 NOT DETECTED NOT DETECTED Final   Coronavirus NL63 NOT DETECTED NOT DETECTED Final   Coronavirus OC43 NOT DETECTED NOT DETECTED Final   Metapneumovirus NOT DETECTED NOT DETECTED Final   Rhinovirus /  Enterovirus NOT DETECTED NOT DETECTED Final   Influenza A NOT DETECTED NOT DETECTED Final   Influenza B NOT DETECTED NOT DETECTED Final   Parainfluenza Virus 1 NOT DETECTED NOT DETECTED Final   Parainfluenza Virus 2 NOT DETECTED NOT DETECTED Final   Parainfluenza Virus 3 NOT DETECTED NOT DETECTED Final   Parainfluenza Virus 4 NOT DETECTED NOT DETECTED Final   Respiratory Syncytial Virus NOT DETECTED NOT DETECTED Final   Bordetella pertussis NOT DETECTED NOT DETECTED Final   Bordetella Parapertussis NOT DETECTED NOT DETECTED Final   Chlamydophila pneumoniae NOT DETECTED NOT DETECTED Final   Mycoplasma pneumoniae NOT DETECTED NOT DETECTED Final    Comment: Performed at [facility], [place] [address] [place], [place] [address]     Radiology Studies: MR THORACIC SPINE W WO CONTRAST  Result Date: 11/11/2020 CLINICAL DATA:  Epidural abscess. EXAM: MRI THORACIC WITHOUT AND WITH CONTRAST TECHNIQUE: Multiplanar and multiecho pulse sequences of the thoracic spine were obtained without and with intravenous contrast. CONTRAST:  49mL GADAVIST GADOBUTROL 1 MMOL/ML IV SOLN COMPARISON:  MRI of the thoracic spine April 23, 2020 FINDINGS: Alignment:  Maintained. Vertebrae: Postsurgical changes from posterior fixation from T7 through the visualized upper lumbar spine with interval removal of T5 and T6 posterior hardware. Susceptibility artifact from the hardware limits evaluation, particularly in the lower thoracic region. Endplate destructive changes at T4-5 appear stable with improved marrow edema. Stable appearance of the T9-10 effusion. No new focus suspicious for discitis/osteomyelitis. No evidence of an epidural abscess. Cord: Evaluation limited by susceptibility artifact. No obvious cord lesion. Paraspinal and other soft tissues: New fluid collection in the subcutaneous from the T3-4 through the T6-7 level measuring up to 6 cm in length by 2.3 x 5.9 largest transverse and anteroposterior dimensions,  likely related to seroma. Extensive right lung consolidation better described on recent chest CT. Disc levels: No high-grade spinal canal stenosis from T1-2 through T9-10. T10-11 and T11-12 levels are obscured by susceptibility artifact. No neural foraminal narrowing from T1-2 through T7-8. Evaluation of the neural foramen below T7-8 is limited by susceptibility artifact. IMPRESSION: 1. Postsurgical changes from posterior fixation from T7 through the visualized upper lumbar spine with interval removal of T5 and T6 posterior hardware. Susceptibility artifact from the hardware limits evaluation, particularly in the lower thoracic region. 2. Endplate destructive changes at T4-5 appear stable with improved marrow edema. Stable appearance of the T9-10 effusion. No evidence of an epidural abscess. 3. New fluid collection in the subcutaneous from the T3-4 through the T6-7 level measuring up to 6 cm in length by 2.3 x 5.9 cm, likely a seroma. 4. Extensive right lung consolidation better described on recent chest CT. Electronically Signed   By: Pedro Earls M.D.   On: 11/11/2020 13:47   IR US Guide Bx Asp/Drain  Result Date: 11/13/2020 INDICATION: Thoracic spine paraspinous small fluid collection postop early EXAM: ULTRASOUND THORACIC PARASPINOUS FLUID COLLECTION ASPIRATION MEDICATIONS: 1% LIDOCAINE LOCAL ANESTHESIA/SEDATION: Moderate Sedation Time:  None. The patient was continuously monitored during the procedure by the interventional radiology nurse under my direct supervision. COMPLICATIONS: None immediate. PROCEDURE: Informed written consent was obtained from the patient after a thorough discussion of the  procedural risks, benefits and alternatives. All questions were addressed. Maximal Sterile Barrier Technique was utilized including caps, mask, sterile gowns, sterile gloves, sterile drape, hand hygiene and skin antiseptic. A timeout was performed prior to the initiation of the procedure. Previous  imaging reviewed. Patient position prone. Preliminary ultrasound performed. The thin paraspinous free fluid about the spinous processes of the mid upper thoracic spine was localized and marked. Under sterile conditions and local anesthesia, an 18 gauge needle was advanced to the paraspinous thoracic fluid. Syringe aspiration yielded 3 cc thin amber serosanguineous fluid. Sample sent for culture. Needle removed. No immediate complication. Patient tolerated the procedure well. IMPRESSION: Successful ultrasound needle aspiration of the upper thoracic paraspinous free fluid Electronically Signed   By: Jerilynn Mages.  Shick M.D.   On: 11/13/2020 09:23   ECHOCARDIOGRAM COMPLETE  Result Date: 11/11/2020    ECHOCARDIOGRAM REPORT   Patient Name:   Luis Brown Date of Exam: 11/11/2020 Medical Rec #:  [id number]        Height:       70.0 in Accession #:    [id number]       Weight:       167.3 lb Date of Birth:  May 01, 1946         BSA:          1.935 m Patient Age:    44 years         BP:           114/68 mmHg Patient Gender: M                HR:           102 bpm. Exam Location:  Inpatient Procedure: 2D Echo Indications:    Bacteremia  History:        Patient has no prior history of Echocardiogram examinations.                 Risk Factors:Hypertension, Dyslipidemia and Diabetes.  Sonographer:    Mikki Santee RDCS (AE) Referring Phys: [place]  1. Left ventricular ejection fraction, by estimation, is 60 to 65%. The left ventricle has normal function. The left ventricle has no regional wall motion abnormalities. Left ventricular diastolic parameters were normal.  2. Right ventricular systolic function is normal. The right ventricular size is mildly enlarged.  3. The mitral valve is normal in structure. No evidence of mitral valve regurgitation.  4. The aortic valve was not well visualized. Aortic valve regurgitation is not visualized. No aortic stenosis is present.  5. The inferior vena cava is normal  in size with greater than 50% respiratory variability, suggesting right atrial pressure of 3 mmHg. Conclusion(s)/Recommendation(s): No vegetation seen. If high clinical suspicion for endocarditis consider TEE. FINDINGS  Left Ventricle: Left ventricular ejection fraction, by estimation, is 60 to 65%. The left ventricle has normal function. The left ventricle has no regional wall motion abnormalities. The left ventricular internal cavity size was normal in size. There is  no left ventricular hypertrophy. Left ventricular diastolic parameters were normal. Right Ventricle: The right ventricular size is mildly enlarged. No increase in right ventricular wall thickness. Right ventricular systolic function is normal. Left Atrium: Left atrial size was normal in size. Right Atrium: Right atrial size was normal in size. Pericardium: Trivial pericardial effusion is present. Mitral Valve: The mitral valve is normal in structure. No evidence of mitral valve regurgitation. Tricuspid Valve: The tricuspid valve is normal in structure. Tricuspid valve regurgitation is not  demonstrated. Aortic Valve: The aortic valve was not well visualized. Aortic valve regurgitation is not visualized. No aortic stenosis is present. Pulmonic Valve: The pulmonic valve was not well visualized. Pulmonic valve regurgitation is not visualized. Aorta: The aortic root is normal in size and structure. Venous: The inferior vena cava is normal in size with greater than 50% respiratory variability, suggesting right atrial pressure of 3 mmHg. IAS/Shunts: The interatrial septum was not well visualized.  LEFT VENTRICLE PLAX 2D LVIDd:         4.70 cm  Diastology LVIDs:         2.80 cm  LV e' medial:    7.83 cm/s LV PW:         0.80 cm  LV E/e' medial:  11.5 LV IVS:        0.80 cm  LV e' lateral:   8.49 cm/s LVOT diam:     2.10 cm  LV E/e' lateral: 10.6 LV SV:         85 LV SV Index:   44 LVOT Area:     3.46 cm  RIGHT VENTRICLE RV S prime:     11.80 cm/s TAPSE  (M-mode): 1.1 cm LEFT ATRIUM           Index       RIGHT ATRIUM           Index LA diam:      3.20 cm 1.65 cm/m  RA Area:     13.30 cm LA Vol (A2C): 52.2 ml 26.98 ml/m RA Volume:   27.80 ml  14.37 ml/m LA Vol (A4C): 37.0 ml 19.12 ml/m  AORTIC VALVE LVOT Vmax:   147.00 cm/s LVOT Vmean:  92.500 cm/s LVOT VTI:    0.246 m  AORTA Ao Root diam: 3.20 cm MITRAL VALVE MV Area (PHT): 5.42 cm    SHUNTS MV Decel Time: 140 msec    Systemic VTI:  0.25 m MV E velocity: 89.70 cm/s  Systemic Diam: 2.10 cm MV A velocity: 88.60 cm/s MV E/A ratio:  1.01 Oswaldo Milian MD Electronically signed by Oswaldo Milian MD Signature Date/Time: 11/11/2020/6:43:20 PM    Final    IR US Guide Bx Asp/Drain  Final Result    MR THORACIC SPINE W WO CONTRAST  Final Result    CT Angio Chest PE W/Cm &/Or Wo Cm  Final Result    DG Chest Port 1 View  Final Result      Scheduled Meds: . atorvastatin  10 mg Oral Daily  . busPIRone  7.5 mg Oral BID  . Chlorhexidine Gluconate Cloth  6 each Topical Daily  . enoxaparin (LOVENOX) injection  40 mg Subcutaneous Q24H  . famotidine  20 mg Oral Daily  . Ferrous Fumarate  1 tablet Oral Daily  . insulin aspart  0-9 Units Subcutaneous TID WC  . ipratropium-albuterol  3 mL Nebulization Q6H  . irbesartan  37.5 mg Oral Daily  . lidocaine (PF)      . metroNIDAZOLE  500 mg Oral Q8H  . pantoprazole  40 mg Oral BID AC  . pregabalin  75 mg Oral BID  . sucralfate  1 g Oral TID WC & HS  . tamsulosin  0.8 mg Oral Daily  . venlafaxine XR  150 mg Oral Q breakfast   PRN Meds: HYDROcodone-acetaminophen, ipratropium-albuterol, sodium chloride flush, tiZANidine, zolpidem Continuous Infusions: . ceFEPime (MAXIPIME) IV 2 g (11/13/20 0956)  . vancomycin 1,000 mg (11/13/20 0552)     LOS: 2 days  Time spent: Greater than 50% of the 35 minute visit was spent in counseling/coordination of care for the patient as laid out in the A&P.   Dwyane Dee, MD [facility] 11/13/2020,  11:49 AM

## 2020-11-13 NOTE — Progress Notes (Signed)
PT Cancellation Note  Patient Details Name: Luis Brown MRN: [id number] DOB: 12/01/1945   Cancelled Treatment:     PT order received but eval deferred this am - RN advises pt in IR for procedure.  Will follow.   Nekeya Briski 11/13/2020, 11:53 AM

## 2020-11-13 NOTE — Progress Notes (Addendum)
Subjective:  He feels much better after fluid was removed from his subcutaneous tissue by interventional radiology.   Antibiotics:  Anti-infectives (From admission, onward)   Start     Dose/Rate Route Frequency Ordered Stop   11/15/20 1000  sulfamethoxazole-trimethoprim (BACTRIM DS) 800-160 MG per tablet 1 tablet        1 tablet Oral Every 12 hours 11/13/20 1441     11/11/20 1400  metroNIDAZOLE (FLAGYL) tablet 500 mg        500 mg Oral Every 8 hours 11/11/20 0941     11/11/20 0100  azithromycin (ZITHROMAX) 500 mg in sodium chloride 0.9 % 250 mL IVPB  Status:  Discontinued        500 mg 250 mL/hr over 60 Minutes Intravenous Every 24 hours 11/10/20 2324 11/11/20 0941   11/10/20 1830  ceFEPIme (MAXIPIME) 2 g in sodium chloride 0.9 % 100 mL IVPB        2 g 200 mL/hr over 30 Minutes Intravenous Every 8 hours 11/10/20 1809 11/14/20 2359   11/10/20 1830  vancomycin (VANCOREADY) IVPB 1750 mg/350 mL  Status:  Discontinued        1,750 mg 175 mL/hr over 120 Minutes Intravenous Every 24 hours 11/10/20 1809 11/10/20 1817   11/10/20 1830  vancomycin (VANCOCIN) IVPB 1000 mg/200 mL premix        1,000 mg 200 mL/hr over 60 Minutes Intravenous Every 12 hours 11/10/20 1817 11/14/20 2359   11/10/20 1816  vancomycin (VANCOCIN) 1000 MG powder       Note to Pharmacy: Guerry Bruin   : cabinet override      11/10/20 1816 11/10/20 1946      Medications: Scheduled Meds: . atorvastatin  10 mg Oral Daily  . busPIRone  7.5 mg Oral BID  . Chlorhexidine Gluconate Cloth  6 each Topical Daily  . enoxaparin (LOVENOX) injection  40 mg Subcutaneous Q24H  . famotidine  20 mg Oral Daily  . Ferrous Fumarate  1 tablet Oral Daily  . insulin aspart  0-9 Units Subcutaneous TID WC  . ipratropium-albuterol  3 mL Nebulization TID  . irbesartan  37.5 mg Oral Daily  . lidocaine (PF)      . metroNIDAZOLE  500 mg Oral Q8H  . pantoprazole  40 mg Oral BID AC  . pregabalin  75 mg Oral BID  . sucralfate  1 g  Oral TID WC & HS  . [START ON 11/15/2020] sulfamethoxazole-trimethoprim  1 tablet Oral Q12H  . tamsulosin  0.8 mg Oral Daily  . venlafaxine XR  150 mg Oral Q breakfast   Continuous Infusions: . ceFEPime (MAXIPIME) IV 2 g (11/13/20 0956)  . vancomycin 1,000 mg (11/13/20 0552)   PRN Meds:.HYDROcodone-acetaminophen, ipratropium-albuterol, sodium chloride flush, tiZANidine, zolpidem    Objective: Weight change:  No intake or output data in the 24 hours ending 11/13/20 1609 Blood pressure 118/69, pulse 86, temperature (!) 97.4 F (36.3 C), resp. rate 15, height 5\' 10"  (1.778 m), weight 75.9 kg, SpO2 90 %. Temp:  [97.4 F (36.3 C)-98.1 F (36.7 C)] 97.4 F (36.3 C) (04/29 1338) Pulse Rate:  [86-99] 86 (04/29 1338) Resp:  [15-20] 15 (04/29 1338) BP: (118-150)/(63-71) 118/69 (04/29 1338) SpO2:  [90 %-94 %] 90 % (04/29 1338)  Physical Exam: Physical Exam Constitutional:      Appearance: He is well-developed.  HENT:     Head: Normocephalic and atraumatic.  Eyes:     Conjunctiva/sclera: Conjunctivae normal.  Cardiovascular:  Rate and Rhythm: Normal rate and regular rhythm.  Pulmonary:     Effort: Pulmonary effort is normal. No respiratory distress.     Breath sounds: Examination of the right-lower field reveals rhonchi. Rhonchi present. No wheezing.  Abdominal:     General: There is no distension.     Palpations: Abdomen is soft.  Musculoskeletal:        General: Normal range of motion.     Cervical back: Normal range of motion and neck supple.  Skin:    General: Skin is warm and dry.     Findings: No erythema or rash.  Neurological:     General: No focal deficit present.     Mental Status: He is alert and oriented to person, place, and time.  Psychiatric:        Mood and Affect: Mood normal.        Behavior: Behavior normal.        Thought Content: Thought content normal.        Judgment: Judgment normal.      CBC:    BMET Recent Labs    11/12/20 0349  11/13/20 0351  NA 135 135  K 3.6 3.6  CL 102 100  CO2 24 24  GLUCOSE 154* 169*  BUN 13 14  CREATININE 0.60* 0.68  CALCIUM 8.3* 8.6*     Liver Panel  No results for input(s): PROT, ALBUMIN, AST, ALT, ALKPHOS, BILITOT, BILIDIR, IBILI in the last 72 hours.     Sedimentation Rate Recent Labs    11/11/20 1548  ESRSEDRATE 47*   C-Reactive Protein Recent Labs    11/12/20 0349  CRP 24.1*    Micro Results: Recent Results (from the past 720 hour(s))  Resp Panel by RT-PCR (Flu A&B, Covid) Nasopharyngeal Swab     Status: None   Collection Time: 11/10/20  4:54 PM   Specimen: Nasopharyngeal Swab; Nasopharyngeal(NP) swabs in vial transport medium  Result Value Ref Range Status   SARS Coronavirus 2 by RT PCR NEGATIVE NEGATIVE Final    Comment: (NOTE) SARS-CoV-2 target nucleic acids are NOT DETECTED.  The SARS-CoV-2 RNA is generally detectable in upper respiratory specimens during the acute phase of infection. The lowest concentration of SARS-CoV-2 viral copies this assay can detect is 138 copies/mL. A negative result does not preclude SARS-Cov-2 infection and should not be used as the sole basis for treatment or other patient management decisions. A negative result may occur with  improper specimen collection/handling, submission of specimen other than nasopharyngeal swab, presence of viral mutation(s) within the areas targeted by this assay, and inadequate number of viral copies(<138 copies/mL). A negative result must be combined with clinical observations, patient history, and epidemiological information. The expected result is Negative.  Fact Sheet for Patients:  EntrepreneurPulse.com.au  Fact Sheet for Healthcare Providers:  IncredibleEmployment.be  This test is no t yet approved or cleared by the [place] FDA and  has been authorized for detection and/or diagnosis of SARS-CoV-2 by FDA under an Emergency Use Authorization  (EUA). This EUA will remain  in effect (meaning this test can be used) for the duration of the COVID-19 declaration under Section 564(b)(1) of the Act, 21 U.S.C.section 360bbb-3(b)(1), unless the authorization is terminated  or revoked sooner.       Influenza A by PCR NEGATIVE NEGATIVE Final   Influenza B by PCR NEGATIVE NEGATIVE Final    Comment: (NOTE) The Xpert Xpress SARS-CoV-2/FLU/RSV plus assay is intended as an aid in the diagnosis of influenza  from Nasopharyngeal swab specimens and should not be used as a sole basis for treatment. Nasal washings and aspirates are unacceptable for Xpert Xpress SARS-CoV-2/FLU/RSV testing.  Fact Sheet for Patients: EntrepreneurPulse.com.au  Fact Sheet for Healthcare Providers: IncredibleEmployment.be  This test is not yet approved or cleared by the [place] FDA and has been authorized for detection and/or diagnosis of SARS-CoV-2 by FDA under an Emergency Use Authorization (EUA). This EUA will remain in effect (meaning this test can be used) for the duration of the COVID-19 declaration under Section 564(b)(1) of the Act, 21 U.S.C. section 360bbb-3(b)(1), unless the authorization is terminated or revoked.  Performed at [facility], [address] [place], [place] [address]   Culture, blood (routine x 2)     Status: None (Preliminary result)   Collection Time: 11/10/20  6:04 PM   Specimen: BLOOD  Result Value Ref Range Status   Specimen Description   Final    BLOOD PICC LINE Performed at [facility], [address] [place], [place] [address]    Special Requests   Final    BOTTLES DRAWN AEROBIC AND ANAEROBIC Blood Culture adequate volume Performed at [facility], [address] [place], [place] [address]    Culture   Final    NO GROWTH 2 DAYS Performed at [facility], [place] [address], [place], [place] [address]    Report Status PENDING  Incomplete   Culture, blood (routine x 2)     Status: None (Preliminary result)   Collection Time: 11/10/20  6:05 PM   Specimen: BLOOD LEFT ARM  Result Value Ref Range Status   Specimen Description   Final    BLOOD LEFT ARM Performed at [facility], [address] [place], [place] [address]    Special Requests   Final    BOTTLES DRAWN AEROBIC AND ANAEROBIC Blood Culture adequate volume Performed at [facility], [address] [place], [place] [address]    Culture   Final    NO GROWTH 2 DAYS Performed at [facility], [place] [address], [place], [place] [address]    Report Status PENDING  Incomplete  Respiratory (~20 pathogens) panel by PCR     Status: None   Collection Time: 11/12/20  1:51 PM  Result Value Ref Range Status   Adenovirus NOT DETECTED NOT DETECTED Final   Coronavirus 229E NOT DETECTED NOT DETECTED Final    Comment: (NOTE) The Coronavirus on the Respiratory Panel, DOES NOT test for the novel  Coronavirus (2019 nCoV)    Coronavirus HKU1 NOT DETECTED NOT DETECTED Final   Coronavirus NL63 NOT DETECTED NOT DETECTED Final   Coronavirus OC43 NOT DETECTED NOT DETECTED Final   Metapneumovirus NOT DETECTED NOT DETECTED Final   Rhinovirus / Enterovirus NOT DETECTED NOT DETECTED Final   Influenza A NOT DETECTED NOT DETECTED Final   Influenza B NOT DETECTED NOT DETECTED Final   Parainfluenza Virus 1 NOT DETECTED NOT DETECTED Final   Parainfluenza Virus 2 NOT DETECTED NOT DETECTED Final   Parainfluenza Virus 3 NOT DETECTED NOT DETECTED Final   Parainfluenza Virus 4 NOT DETECTED NOT DETECTED Final   Respiratory Syncytial Virus NOT DETECTED NOT DETECTED Final   Bordetella pertussis NOT DETECTED NOT DETECTED Final   Bordetella Parapertussis NOT DETECTED NOT DETECTED Final   Chlamydophila pneumoniae NOT DETECTED NOT DETECTED Final   Mycoplasma pneumoniae NOT DETECTED NOT DETECTED Final  Comment: Performed at [facility], [address]  [place], [place] [address]  Aerobic/Anaerobic Culture (surgical/deep wound)     Status: None (Preliminary result)   Collection Time: 11/13/20  8:47 AM   Specimen: Thoracic; Wound  Result Value Ref Range Status   Specimen Description   Final    THORACIC T5 T6 Performed at [facility], [address], [place], [place] [address]    Special Requests   Final    Normal Performed at [facility], [address], [place], [place] [address]    Gram Stain   Final    FEW WBC PRESENT,BOTH PMN AND MONONUCLEAR NO ORGANISMS SEEN Performed at [facility], [address], [place], [place] [address]    Culture PENDING  Incomplete   Report Status PENDING  Incomplete    Studies/Results: IR US Guide Bx Asp/Drain  Result Date: 11/13/2020 INDICATION: Thoracic spine paraspinous small fluid collection postop early EXAM: ULTRASOUND THORACIC PARASPINOUS FLUID COLLECTION ASPIRATION MEDICATIONS: 1% LIDOCAINE LOCAL ANESTHESIA/SEDATION: Moderate Sedation Time:  None. The patient was continuously monitored during the procedure by the interventional radiology nurse under my direct supervision. COMPLICATIONS: None immediate. PROCEDURE: Informed written consent was obtained from the patient after a thorough discussion of the procedural risks, benefits and alternatives. All questions were addressed. Maximal Sterile Barrier Technique was utilized including caps, mask, sterile gowns, sterile gloves, sterile drape, hand hygiene and skin antiseptic. A timeout was performed prior to the initiation of the procedure. Previous imaging reviewed. Patient position prone. Preliminary ultrasound performed. The thin paraspinous free fluid about the spinous processes of the mid upper thoracic spine was localized and marked. Under sterile conditions and local anesthesia, an 18 gauge needle was advanced to the paraspinous thoracic fluid. Syringe aspiration yielded 3 cc thin amber serosanguineous fluid.  Sample sent for culture. Needle removed. No immediate complication. Patient tolerated the procedure well. IMPRESSION: Successful ultrasound needle aspiration of the upper thoracic paraspinous free fluid Electronically Signed   By: Judie Petit.  Shick M.D.   On: 11/13/2020 09:23      Assessment/Plan:  Interventional radiology of removed some serosanguineous fluid from his subcutaneous tissue   Principal Problem:   Pneumonia of right lower lobe due to infectious organism Active Problems:   HTN (hypertension)   Diabetes mellitus without complication (HCC)   Hyperlipidemia   BPH (benign prostatic hyperplasia)   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HCC)   Sepsis (HCC)    Luis Brown is a 75 y.o. 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  He then had loosening of screws and had them removed by Dr. Otelia Sergeant due to continuous pain at that site.  There is no purulence but culture did yield a Staph epidermidis.  He was placed on oral Bactrim and then seen by my partner Dr. Renold Don who placed him on IV daptomycin with stop date due in a few days.  He has now been admitted with extensive right-sided pneumonia.  He continues to complain of back pain that is not improved despite IV antibiotics.  #1 Pneumonia: Continue vancomycin cefepime and metronidazole for now  Did have some areas of cavitation I would have not suggested septic emboli as radiology did since it is unilateral.  2D echocardiogram also does not show vegetations.  Tomorrow will be day #5 of antibiotics I would stop them then and pull PICC  #2  Hardware  associated thoracic spine infection: MRI is reassuring inflammatory markers are up but also could be due to his pneumonia.  His organism is covered with vancomycin for now, as above stop vancomycin and cefepime after tomorrow's doses  Start Bactrim 1 double  strength tablet twice daily on Sunday  I will recheck a BMP when I see him this coming Friday  #3 fluid collection identified on imaging:  This is been aspirated and does not appear to be infected.  We will have her follow-up on culture data.   Luis Brown has an appointment on 11/20/2020 at [facility]  with Dr. Tommy Medal  (he had appt with Dr. Gale Journey on the 9th but requested to start seeing me again and since I want to check BMP next week I decided to go ahead and see him next week on Friday)  [facility] is located in the [facility] at  [address] in [place].  [address], which is located to the left of the elevators.  Phone: [phone]  Fax: [phone]  [email or web address]  Dr. Gale Journey is available for questions this weekend.    LOS: 2 days   Alcide Evener 11/13/2020, 4:09 PM

## 2020-11-14 ENCOUNTER — Other Ambulatory Visit: Payer: Self-pay | Admitting: Medical

## 2020-11-14 DIAGNOSIS — G4734 Idiopathic sleep related nonobstructive alveolar hypoventilation: Secondary | ICD-10-CM

## 2020-11-14 DIAGNOSIS — J189 Pneumonia, unspecified organism: Secondary | ICD-10-CM | POA: Diagnosis not present

## 2020-11-14 DIAGNOSIS — J9601 Acute respiratory failure with hypoxia: Secondary | ICD-10-CM

## 2020-11-14 DIAGNOSIS — M4624 Osteomyelitis of vertebra, thoracic region: Secondary | ICD-10-CM | POA: Diagnosis not present

## 2020-11-14 LAB — GLUCOSE, CAPILLARY
Glucose-Capillary: 183 mg/dL — ABNORMAL HIGH (ref 70–99)
Glucose-Capillary: 182 mg/dL — ABNORMAL HIGH (ref 70–99)
Glucose-Capillary: 202 mg/dL — ABNORMAL HIGH (ref 70–99)
Glucose-Capillary: 173 mg/dL — ABNORMAL HIGH (ref 70–99)

## 2020-11-14 LAB — CBC WITH DIFFERENTIAL/PLATELET
Abs Immature Granulocytes: 0.05 10*3/uL (ref 0.00–0.07)
Basophils Absolute: 0 10*3/uL (ref 0.0–0.1)
Basophils Relative: 0 %
Eosinophils Absolute: 0.6 10*3/uL — ABNORMAL HIGH (ref 0.0–0.5)
Eosinophils Relative: 5 %
HCT: 32.7 % — ABNORMAL LOW (ref 39.0–52.0)
Hemoglobin: 10.6 g/dL — ABNORMAL LOW (ref 13.0–17.0)
Immature Granulocytes: 1 %
Lymphocytes Relative: 3 %
Lymphs Abs: 0.3 10*3/uL — ABNORMAL LOW (ref 0.7–4.0)
MCH: 29.9 pg (ref 26.0–34.0)
MCHC: 32.4 g/dL (ref 30.0–36.0)
MCV: 92.4 fL (ref 80.0–100.0)
Monocytes Absolute: 1.2 10*3/uL — ABNORMAL HIGH (ref 0.1–1.0)
Monocytes Relative: 11 %
Neutro Abs: 8.9 10*3/uL — ABNORMAL HIGH (ref 1.7–7.7)
Neutrophils Relative %: 80 %
Platelets: 270 10*3/uL (ref 150–400)
RBC: 3.54 MIL/uL — ABNORMAL LOW (ref 4.22–5.81)
RDW: 13.7 % (ref 11.5–15.5)
WBC: 11.1 10*3/uL — ABNORMAL HIGH (ref 4.0–10.5)
nRBC: 0 % (ref 0.0–0.2)

## 2020-11-14 LAB — BASIC METABOLIC PANEL
Anion gap: 8 (ref 5–15)
BUN: 16 mg/dL (ref 8–23)
CO2: 27 mmol/L (ref 22–32)
Calcium: 8.2 mg/dL — ABNORMAL LOW (ref 8.9–10.3)
Chloride: 101 mmol/L (ref 98–111)
Creatinine, Ser: 0.65 mg/dL (ref 0.61–1.24)
GFR, Estimated: >60 mL/min (ref >60)
Glucose, Bld: 183 mg/dL — ABNORMAL HIGH (ref 70–99)
Potassium: 4.2 mmol/L (ref 3.5–5.1)
Sodium: 136 mmol/L (ref 135–145)

## 2020-11-14 LAB — MAGNESIUM: Magnesium: 1.8 mg/dL (ref 1.7–2.4)

## 2020-11-14 MED ORDER — SODIUM CHLORIDE 0.9 % IV SOLN
2.0000 g | Freq: Three times a day (TID) | INTRAVENOUS | Status: DC
  Administered 2020-11-14 – 2020-11-18 (×11): 2 g via INTRAVENOUS
  Filled 2020-11-14 (×12): qty 2

## 2020-11-14 MED ORDER — DM-GUAIFENESIN ER 30-600 MG PO TB12
1.0000 | ORAL_TABLET | Freq: Two times a day (BID) | ORAL | Status: DC
  Administered 2020-11-14 – 2020-11-23 (×18): 1 via ORAL
  Filled 2020-11-14 (×18): qty 1

## 2020-11-14 MED ORDER — ALTEPLASE 2 MG IJ SOLR
2.0000 mg | Freq: Once | INTRAMUSCULAR | Status: AC
  Administered 2020-11-14: 2 mg
  Filled 2020-11-14: qty 2

## 2020-11-14 NOTE — Progress Notes (Signed)
PT demonstrated hands on understanding of Flutter device. 

## 2020-11-14 NOTE — Progress Notes (Signed)
Progress Note    Luis Brown   [id number]  DOB: 1945/08/24  DOA: 11/10/2020     3  PCP: Elise Benne  CC: SOB  Hospital Course: Luis Brown is a 75 yo male with PMH vertebral OM (was on 6 wk Dapto course PTA), peripheral neuropathy, HLD, HTN, DMII, DDD, BPH, anxiety/depression who presented to the hospital with SOB.  He initially presented to his PCPs office and was found to be hypoxic.  He was referred to the ER.  He underwent work-up with CTA chest which showed a large right sided PNA with ground glass nodules and concern for central cavitation.  He was started on IV antibiotics on admission.  Infectious disease was consulted given prolonged antibiotic course at home and now with atypical pneumonia.  Blood cultures were obtained on admission.  Repeat MRI T-spine and echo were also ordered. The echo was negative for valvular vegetation.  MRI was negative for deep infection but did reveal a subcutaneous probable seroma.  IR was consulted per ID for aspiration/drainage and culturing of fluid.  He underwent aspiration on 11/13/2020.  Fluid studies were sent for culture.   Interval History:  He was placed on oxygen overnight for the first time.  This morning he is not in distress but does appear more anxious than usual.  Wife on the phone and present for interview/exam. We discussed trying to wean oxygen some today if able but if unsuccessful he will need home oxygen.  Goal is discharging home tomorrow.  ROS: Constitutional: negative for chills and fevers, Respiratory: positive for cough, Cardiovascular: negative for chest pain and Gastrointestinal: negative for abdominal pain  Assessment & Plan: * HCAP (healthcare-associated pneumonia) - CAP with higher risk MDRO due to prolonged IV abx (dapto) and recent exposure to healthcare - continue vanc, cefepime. - d/c azithro. Added flagyl 4/27 - ID consulted - CTA read on 4/26 suggests right sided cavitary lesions only? Atypical  if this is considered embolic (sources would be cardiac vs PICC) - follow up blood cultures; hold off on PICC cx for now but if spikes fever or PIV cultures positive then may need to remove PICC - MRI T spine shows probable seroma; IR consulted per ID and plan is for aspiration/drainage on 11/13/20 (3cc drained, non infected appearing) - follow up fluid studies from aspiration: no organisms - continue flutter and IS - ambulate Qshift; PT ordered  - will complete PO abx course at discharge - will plan on transitioning vanc to bactrim as already ordered; keep cefepime/flagyl until day of discharge (hopeful for Sunday)  Acute respiratory failure with hypoxia ([place]) - Has not been on oxygen at all during hospitalization but overnight became short of breath and slightly hypoxic and was placed on oxygen. - Incentive spirometer finally at bedside today.  Continue to encourage use - Wean oxygen as able.  If still required tomorrow, then will arrange for home O2  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[place]) - s/p removal of hardware of T5, T6 pedicle screws/rods, exploration of fusion on 09/14/20 - diagnosed with hardware associated Staph Epi OM. Seen by ID on 09/28/20 (prior to this had also been on a prolonged abx course for prior infection). Plan was: 6 weeks Dapto followed by Bactrim up to 3 months if tolerates - seen by ID this admission again; continue on Vanc for now while in hospital  - repeat MRI T-spine 11/11/20: no epidural abscess. New fluid collection in SQ tissue T3-4 to T6-7 measuring 6 cm,  considered a seroma (to be aspirated for culture on 4/29) - vanc to bactrim transition on 11/15/20, then outpt followup with ID on 11/20/20  Sepsis ([place]) - tachycardia, tachypnea, pneumonia source - see CAP  BPH (benign prostatic hyperplasia) - Continue Flomax  Hyperlipidemia - Continue Lipitor  Diabetes mellitus without complication ([place]) - continue SSI and CBG monitoring  - last A1c 6.3% on  09/08/20  HTN (hypertension) - Continue Avapro  Hyponatremia-resolved as of 11/11/2020 - Na 130 on admission; suspected hypovolemia - improved   Old records reviewed in assessment of this patient  Antimicrobials: Azithromycin 11/11/20 x 1 Vancomycin 11/10/2020 >> current Cefepime 11/10/2020 >> current  Flagyl 11/11/2020 >> current  DVT prophylaxis: enoxaparin (LOVENOX) injection 40 mg Start: 11/11/20 1000   Code Status:   Code Status: Full Code Family Communication: wife  Disposition Plan: Status is: Inpatient  Remains inpatient appropriate because:Ongoing diagnostic testing needed not appropriate for outpatient work up, IV treatments appropriate due to intensity of illness or inability to take PO and Inpatient level of care appropriate due to severity of illness   Dispo: The patient is from: Home              Anticipated d/c is to: Home              Patient currently is not medically stable to d/c.   Difficult to place patient No  Risk of unplanned readmission score: Unplanned Admission- Pilot do not use: 18.17   Objective: Blood pressure (!) 109/58, pulse 80, temperature 98.2 F (36.8 C), temperature source Oral, resp. rate (!) 24, height 5\' 10"  (1.778 m), weight 75.9 kg, SpO2 93 %.  Examination: General appearance: alert, cooperative and no distress Head: Normocephalic, without obvious abnormality, atraumatic Eyes: EOMI Lungs: Breath sounds better on right lung fields with significant rhonchi noted.  No obvious wheezing.  Heart: regular rate and rhythm and S1, S2 normal Abdomen: normal findings: bowel sounds normal and soft, non-tender Extremities: no edema; RUE PICC in place with no surrounding signs of infection Skin: mobility and turgor normal Neurologic: Grossly normal  Consultants:   ID  IR  Procedures:   Thoracic fluid collection drainage with IR: 11/13/2020  Data Reviewed: I have personally reviewed following labs and imaging studies Results for orders  placed or performed during the hospital encounter of 11/10/20 (from the past 24 hour(s))  Glucose, capillary     Status: Abnormal   Collection Time: 11/13/20  5:36 PM  Result Value Ref Range   Glucose-Capillary 224 (H) 70 - 99 mg/dL  Glucose, capillary     Status: Abnormal   Collection Time: 11/13/20  8:04 PM  Result Value Ref Range   Glucose-Capillary 163 (H) 70 - 99 mg/dL  Basic metabolic panel     Status: Abnormal   Collection Time: 11/14/20  4:21 AM  Result Value Ref Range   Sodium 136 135 - 145 mmol/L   Potassium 4.2 3.5 - 5.1 mmol/L   Chloride 101 98 - 111 mmol/L   CO2 27 22 - 32 mmol/L   Glucose, Bld 183 (H) 70 - 99 mg/dL   BUN 16 8 - 23 mg/dL   Creatinine, Ser 0.65 0.61 - 1.24 mg/dL   Calcium 8.2 (L) 8.9 - 10.3 mg/dL   GFR, Estimated >60 >60 mL/min   Anion gap 8 5 - 15  CBC with Differential/Platelet     Status: Abnormal   Collection Time: 11/14/20  4:21 AM  Result Value Ref Range   WBC 11.1 (  H) 4.0 - 10.5 K/uL   RBC 3.54 (L) 4.22 - 5.81 MIL/uL   Hemoglobin 10.6 (L) 13.0 - 17.0 g/dL   HCT 32.7 (L) 39.0 - 52.0 %   MCV 92.4 80.0 - 100.0 fL   MCH 29.9 26.0 - 34.0 pg   MCHC 32.4 30.0 - 36.0 g/dL   RDW 13.7 11.5 - 15.5 %   Platelets 270 150 - 400 K/uL   nRBC 0.0 0.0 - 0.2 %   Neutrophils Relative % 80 %   Neutro Abs 8.9 (H) 1.7 - 7.7 K/uL   Lymphocytes Relative 3 %   Lymphs Abs 0.3 (L) 0.7 - 4.0 K/uL   Monocytes Relative 11 %   Monocytes Absolute 1.2 (H) 0.1 - 1.0 K/uL   Eosinophils Relative 5 %   Eosinophils Absolute 0.6 (H) 0.0 - 0.5 K/uL   Basophils Relative 0 %   Basophils Absolute 0.0 0.0 - 0.1 K/uL   Immature Granulocytes 1 %   Abs Immature Granulocytes 0.05 0.00 - 0.07 K/uL  Magnesium     Status: None   Collection Time: 11/14/20  4:21 AM  Result Value Ref Range   Magnesium 1.8 1.7 - 2.4 mg/dL  Glucose, capillary     Status: Abnormal   Collection Time: 11/14/20  7:57 AM  Result Value Ref Range   Glucose-Capillary 183 (H) 70 - 99 mg/dL  Glucose,  capillary     Status: Abnormal   Collection Time: 11/14/20 11:10 AM  Result Value Ref Range   Glucose-Capillary 182 (H) 70 - 99 mg/dL   *Note: Due to a large number of results and/or encounters for the requested time period, some results have not been displayed. A complete set of results can be found in Results Review.    Recent Results (from the past 240 hour(s))  Resp Panel by RT-PCR (Flu A&B, Covid) Nasopharyngeal Swab     Status: None   Collection Time: 11/10/20  4:54 PM   Specimen: Nasopharyngeal Swab; Nasopharyngeal(NP) swabs in vial transport medium  Result Value Ref Range Status   SARS Coronavirus 2 by RT PCR NEGATIVE NEGATIVE Final    Comment: (NOTE) SARS-CoV-2 target nucleic acids are NOT DETECTED.  The SARS-CoV-2 RNA is generally detectable in upper respiratory specimens during the acute phase of infection. The lowest concentration of SARS-CoV-2 viral copies this assay can detect is 138 copies/mL. A negative result does not preclude SARS-Cov-2 infection and should not be used as the sole basis for treatment or other patient management decisions. A negative result may occur with  improper specimen collection/handling, submission of specimen other than nasopharyngeal swab, presence of viral mutation(s) within the areas targeted by this assay, and inadequate number of viral copies(<138 copies/mL). A negative result must be combined with clinical observations, patient history, and epidemiological information. The expected result is Negative.  Fact Sheet for Patients:  EntrepreneurPulse.com.au  Fact Sheet for Healthcare Providers:  IncredibleEmployment.be  This test is no t yet approved or cleared by the [place] FDA and  has been authorized for detection and/or diagnosis of SARS-CoV-2 by FDA under an Emergency Use Authorization (EUA). This EUA will remain  in effect (meaning this test can be used) for the duration of the COVID-19  declaration under Section 564(b)(1) of the Act, 21 U.S.C.section 360bbb-3(b)(1), unless the authorization is terminated  or revoked sooner.       Influenza A by PCR NEGATIVE NEGATIVE Final   Influenza B by PCR NEGATIVE NEGATIVE Final    Comment: (NOTE) The  Xpert Xpress SARS-CoV-2/FLU/RSV plus assay is intended as an aid in the diagnosis of influenza from Nasopharyngeal swab specimens and should not be used as a sole basis for treatment. Nasal washings and aspirates are unacceptable for Xpert Xpress SARS-CoV-2/FLU/RSV testing.  Fact Sheet for Patients: EntrepreneurPulse.com.au  Fact Sheet for Healthcare Providers: IncredibleEmployment.be  This test is not yet approved or cleared by the [place] FDA and has been authorized for detection and/or diagnosis of SARS-CoV-2 by FDA under an Emergency Use Authorization (EUA). This EUA will remain in effect (meaning this test can be used) for the duration of the COVID-19 declaration under Section 564(b)(1) of the Act, 21 U.S.C. section 360bbb-3(b)(1), unless the authorization is terminated or revoked.  Performed at [facility], [address] [place], [place] [address]   Culture, blood (routine x 2)     Status: None (Preliminary result)   Collection Time: 11/10/20  6:04 PM   Specimen: BLOOD  Result Value Ref Range Status   Specimen Description   Final    BLOOD PICC LINE Performed at [facility], [address] [place], [place] [address]    Special Requests   Final    BOTTLES DRAWN AEROBIC AND ANAEROBIC Blood Culture adequate volume Performed at [facility], [address] [place], [place] [address]    Culture   Final    NO GROWTH 3 DAYS Performed at [facility], [place] [address], [place], [place] [address]    Report Status PENDING  Incomplete  Culture, blood (routine x 2)     Status: None (Preliminary result)   Collection Time: 11/10/20  6:05 PM    Specimen: BLOOD LEFT ARM  Result Value Ref Range Status   Specimen Description   Final    BLOOD LEFT ARM Performed at [facility], [address] [place], [place] [address]    Special Requests   Final    BOTTLES DRAWN AEROBIC AND ANAEROBIC Blood Culture adequate volume Performed at [facility], [address] [place], [place] [address]    Culture   Final    NO GROWTH 3 DAYS Performed at [facility], [place] [address] [place], [place] [address]    Report Status PENDING  Incomplete  Respiratory (~20 pathogens) panel by PCR     Status: None   Collection Time: 11/12/20  1:51 PM  Result Value Ref Range Status   Adenovirus NOT DETECTED NOT DETECTED Final   Coronavirus 229E NOT DETECTED NOT DETECTED Final    Comment: (NOTE) The Coronavirus on the Respiratory Panel, DOES NOT test for the novel  Coronavirus (2019 nCoV)    Coronavirus HKU1 NOT DETECTED NOT DETECTED Final   Coronavirus NL63 NOT DETECTED NOT DETECTED Final   Coronavirus OC43 NOT DETECTED NOT DETECTED Final   Metapneumovirus NOT DETECTED NOT DETECTED Final   Rhinovirus / Enterovirus NOT DETECTED NOT DETECTED Final   Influenza A NOT DETECTED NOT DETECTED Final   Influenza B NOT DETECTED NOT DETECTED Final   Parainfluenza Virus 1 NOT DETECTED NOT DETECTED Final   Parainfluenza Virus 2 NOT DETECTED NOT DETECTED Final   Parainfluenza Virus 3 NOT DETECTED NOT DETECTED Final   Parainfluenza Virus 4 NOT DETECTED NOT DETECTED Final   Respiratory Syncytial Virus NOT DETECTED NOT DETECTED Final   Bordetella pertussis NOT DETECTED NOT DETECTED Final   Bordetella Parapertussis NOT DETECTED NOT DETECTED Final   Chlamydophila pneumoniae NOT DETECTED  NOT DETECTED Final   Mycoplasma pneumoniae NOT DETECTED NOT DETECTED Final    Comment: Performed at [facility], [place] [address] [place], [place] [address]  Aerobic/Anaerobic Culture (surgical/deep wound)     Status: None (Preliminary result)    Collection Time: 11/13/20  8:47 AM   Specimen: Thoracic; Wound  Result Value Ref Range Status   Specimen Description   Final    THORACIC T5 T6 Performed at [place] [address] [place], [place] [address]    Special Requests   Final    Normal Performed at [facility], [place] [address] [place], [place] [address]    Gram Stain   Final    FEW WBC PRESENT,BOTH PMN AND MONONUCLEAR NO ORGANISMS SEEN    Culture   Final    NO GROWTH 1 DAY Performed at [facility], [place] [address] [place], [place] [address]    Report Status PENDING  Incomplete     Radiology Studies: IR US Guide Bx Asp/Drain  Result Date: 11/13/2020 INDICATION: Thoracic spine paraspinous small fluid collection postop early EXAM: ULTRASOUND THORACIC PARASPINOUS FLUID COLLECTION ASPIRATION MEDICATIONS: 1% LIDOCAINE LOCAL ANESTHESIA/SEDATION: Moderate Sedation Time:  None. The patient was continuously monitored during the procedure by the interventional radiology nurse under my direct supervision. COMPLICATIONS: None immediate. PROCEDURE: Informed written consent was obtained from the patient after a thorough discussion of the procedural risks, benefits and alternatives. All questions were addressed. Maximal Sterile Barrier Technique was utilized including caps, mask, sterile gowns, sterile gloves, sterile drape, hand hygiene and skin antiseptic. A timeout was performed prior to the initiation of the procedure. Previous imaging reviewed. Patient position prone. Preliminary ultrasound performed. The thin paraspinous free fluid about the spinous processes of the mid upper thoracic spine was localized and marked. Under sterile conditions and local anesthesia, an 18 gauge needle was advanced to the paraspinous thoracic fluid. Syringe aspiration yielded 3 cc thin amber serosanguineous fluid. Sample sent for culture. Needle removed. No immediate complication. Patient tolerated the procedure  well. IMPRESSION: Successful ultrasound needle aspiration of the upper thoracic paraspinous free fluid Electronically Signed   By: Jerilynn Mages.  Shick M.D.   On: 11/13/2020 09:23   IR US Guide Bx Asp/Drain  Final Result    MR THORACIC SPINE W WO CONTRAST  Final Result    CT Angio Chest PE W/Cm &/Or Wo Cm  Final Result    DG Chest Port 1 View  Final Result      Scheduled Meds: . atorvastatin  10 mg Oral Daily  . busPIRone  7.5 mg Oral BID  . Chlorhexidine Gluconate Cloth  6 each Topical Daily  . dextromethorphan-guaiFENesin  1 tablet Oral BID  . enoxaparin (LOVENOX) injection  40 mg Subcutaneous Q24H  . famotidine  20 mg Oral Daily  . Ferrous Fumarate  1 tablet Oral Daily  . insulin aspart  0-9 Units Subcutaneous TID WC  . ipratropium-albuterol  3 mL Nebulization TID  . irbesartan  37.5 mg Oral Daily  . metroNIDAZOLE  500 mg Oral Q8H  . pantoprazole  40 mg Oral BID AC  . pregabalin  75 mg Oral BID  . sucralfate  1 g Oral TID WC & HS  . [START ON 11/15/2020] sulfamethoxazole-trimethoprim  1 tablet Oral Q12H  . tamsulosin  0.8 mg Oral Daily  . venlafaxine XR  150 mg Oral Q breakfast   PRN Meds: HYDROcodone-acetaminophen, ipratropium-albuterol, sodium chloride flush, tiZANidine, zolpidem Continuous Infusions: . ceFEPime (MAXIPIME)  IV 2 g (11/14/20 1108)  . vancomycin 1,000 mg (11/14/20 0609)     LOS: 3 days  Time spent: Greater than 50% of the 35 minute visit was spent in counseling/coordination of care for the patient as laid out in the A&P.   Dwyane Dee, MD [facility] 11/14/2020, 12:08 PM

## 2020-11-14 NOTE — Assessment & Plan Note (Addendum)
-   Has not been on oxygen at all during hospitalization but started requiring on 4/30.  - may be due to evolution of PNA vs development of effusion: CTA chest on 5/1 shows both (negative for PE however) - Continue oxygen.  -Continue incentive spirometer and flutter - was on 4L prior to bronch; on 6L after bronch, anticipate should be able to wean back some after BAL fluids reabsorbs slowly. - patient knows to ambulate, use IS, and work on anxiety control

## 2020-11-15 ENCOUNTER — Inpatient Hospital Stay (HOSPITAL_COMMUNITY): Payer: Medicare Other

## 2020-11-15 DIAGNOSIS — A419 Sepsis, unspecified organism: Secondary | ICD-10-CM | POA: Diagnosis not present

## 2020-11-15 DIAGNOSIS — J9601 Acute respiratory failure with hypoxia: Secondary | ICD-10-CM | POA: Diagnosis not present

## 2020-11-15 DIAGNOSIS — J189 Pneumonia, unspecified organism: Secondary | ICD-10-CM | POA: Diagnosis not present

## 2020-11-15 LAB — CBC WITH DIFFERENTIAL/PLATELET
Abs Immature Granulocytes: 0.08 10*3/uL — ABNORMAL HIGH (ref 0.00–0.07)
Basophils Absolute: 0 10*3/uL (ref 0.0–0.1)
Basophils Relative: 0 %
Eosinophils Absolute: 0.6 10*3/uL — ABNORMAL HIGH (ref 0.0–0.5)
Eosinophils Relative: 5 %
HCT: 34.2 % — ABNORMAL LOW (ref 39.0–52.0)
Hemoglobin: 11.2 g/dL — ABNORMAL LOW (ref 13.0–17.0)
Immature Granulocytes: 1 %
Lymphocytes Relative: 4 %
Lymphs Abs: 0.5 10*3/uL — ABNORMAL LOW (ref 0.7–4.0)
MCH: 30 pg (ref 26.0–34.0)
MCHC: 32.7 g/dL (ref 30.0–36.0)
MCV: 91.7 fL (ref 80.0–100.0)
Monocytes Absolute: 1.3 10*3/uL — ABNORMAL HIGH (ref 0.1–1.0)
Monocytes Relative: 11 %
Neutro Abs: 9 10*3/uL — ABNORMAL HIGH (ref 1.7–7.7)
Neutrophils Relative %: 79 %
Platelets: 247 10*3/uL (ref 150–400)
RBC: 3.73 MIL/uL — ABNORMAL LOW (ref 4.22–5.81)
RDW: 13.8 % (ref 11.5–15.5)
WBC: 11.5 10*3/uL — ABNORMAL HIGH (ref 4.0–10.5)
nRBC: 0 % (ref 0.0–0.2)

## 2020-11-15 LAB — BASIC METABOLIC PANEL
Anion gap: 10 (ref 5–15)
BUN: 15 mg/dL (ref 8–23)
CO2: 24 mmol/L (ref 22–32)
Calcium: 8.1 mg/dL — ABNORMAL LOW (ref 8.9–10.3)
Chloride: 99 mmol/L (ref 98–111)
Creatinine, Ser: 0.59 mg/dL — ABNORMAL LOW (ref 0.61–1.24)
GFR, Estimated: >60 mL/min (ref >60)
Glucose, Bld: 176 mg/dL — ABNORMAL HIGH (ref 70–99)
Potassium: 3.3 mmol/L — ABNORMAL LOW (ref 3.5–5.1)
Sodium: 133 mmol/L — ABNORMAL LOW (ref 135–145)

## 2020-11-15 LAB — GLUCOSE, CAPILLARY
Glucose-Capillary: 165 mg/dL — ABNORMAL HIGH (ref 70–99)
Glucose-Capillary: 250 mg/dL — ABNORMAL HIGH (ref 70–99)
Glucose-Capillary: 252 mg/dL — ABNORMAL HIGH (ref 70–99)
Glucose-Capillary: 147 mg/dL — ABNORMAL HIGH (ref 70–99)

## 2020-11-15 LAB — MAGNESIUM: Magnesium: 1.9 mg/dL (ref 1.7–2.4)

## 2020-11-15 MED ORDER — SALINE SPRAY 0.65 % NA SOLN
1.0000 | NASAL | Status: DC | PRN
  Filled 2020-11-15: qty 44

## 2020-11-15 MED ORDER — HYDROCODONE-ACETAMINOPHEN 7.5-325 MG PO TABS
1.0000 | ORAL_TABLET | ORAL | Status: DC | PRN
  Administered 2020-11-15 – 2020-11-23 (×33): 1 via ORAL
  Filled 2020-11-15 (×34): qty 1

## 2020-11-15 MED ORDER — VANCOMYCIN HCL 1000 MG/200ML IV SOLN
1000.0000 mg | Freq: Two times a day (BID) | INTRAVENOUS | Status: DC
  Administered 2020-11-15 – 2020-11-18 (×6): 1000 mg via INTRAVENOUS
  Filled 2020-11-15 (×6): qty 200

## 2020-11-15 MED ORDER — MORPHINE SULFATE (PF) 2 MG/ML IV SOLN
2.0000 mg | INTRAVENOUS | Status: DC | PRN
  Administered 2020-11-15: 2 mg via INTRAVENOUS
  Filled 2020-11-15: qty 1

## 2020-11-15 MED ORDER — POTASSIUM CHLORIDE CRYS ER 20 MEQ PO TBCR
40.0000 meq | EXTENDED_RELEASE_TABLET | Freq: Once | ORAL | Status: AC
  Administered 2020-11-15: 40 meq via ORAL
  Filled 2020-11-15: qty 2

## 2020-11-15 MED ORDER — TESTOSTERONE CYPIONATE 200 MG/ML IM SOLN
200.0000 mg | Freq: Once | INTRAMUSCULAR | Status: DC
  Filled 2020-11-15: qty 1

## 2020-11-15 MED ORDER — ACETAMINOPHEN 325 MG PO TABS: 650.0000 mg | ORAL_TABLET | ORAL | Status: DC | PRN

## 2020-11-15 MED ORDER — IOHEXOL 350 MG/ML SOLN
100.0000 mL | Freq: Once | INTRAVENOUS | Status: AC | PRN
  Administered 2020-11-15: 100 mL via INTRAVENOUS

## 2020-11-15 MED ORDER — ACETAMINOPHEN 325 MG PO TABS
650.0000 mg | ORAL_TABLET | Freq: Four times a day (QID) | ORAL | Status: DC | PRN
  Administered 2020-11-20 – 2020-11-21 (×2): 650 mg via ORAL
  Filled 2020-11-15 (×2): qty 2

## 2020-11-15 MED ORDER — TESTOSTERONE CYPIONATE 200 MG/ML IM SOLN
200.0000 mg | Freq: Once | INTRAMUSCULAR | Status: AC
  Administered 2020-11-16: 200 mg via INTRAMUSCULAR

## 2020-11-15 NOTE — Progress Notes (Signed)
Pharmacy Antibiotic Note  Luis Brown is a 75 y.o. male admitted on 11/10/2020 with pneumonia, hx vertebral OM.  Pharmacy has been consulted for vancomycin dosing.  Pt has PMH significant for hardware associated thoracic spine infection was on daptomycin PTA with planned stop date of 5/1 followed by transition to SMX/TMP. Pt admitted with PNA and has been on broad spectrum antibiotics (vancomycin + cefepime + metronidazole) during admission. ID consulted.   Vancomycin course initially scheduled to end on 4/30, pharmacy re-consulted to resume vancomycin today.   Today, 11/15/20  WBC remains slightly elevated  SCr stable, WNL  Afebrile  Requiring 2 L [place]  Today is day #5 of full dose IV antibiotics. Last vancomycin dose given @ 1825 on 4/30 so no doses missed, no need for loading dose.  Plan:  Resume previous vancomycin regimen of 1000 mg IV q12h  Continue cefepime 2 g IV q8h and metronidazole 500 mg IV q8h  Goal vancomycin AUC 400-550  Monitor renal function, vancomycin levels at steady state as needed  Height: 5\' 10"  (177.8 cm) Weight: 75.9 kg (167 lb 5.3 oz) IBW/kg (Calculated) : 73  Temp (24hrs), Avg:98 F (36.7 C), Min:97.4 F (36.3 C), Max:98.3 F (36.8 C)  Recent Labs  Lab 11/10/20 1654 11/11/20 0424 11/12/20 0349 11/13/20 0351 11/14/20 0421 11/15/20 0313  WBC  --  13.6* 13.0* 17.1* 11.1* 11.5*  CREATININE  --  0.72 0.60* 0.68 0.65 0.59*  LATICACIDVEN 0.8  --   --   --   --   --     Estimated Creatinine Clearance: 79.8 mL/min (A) (by C-G formula based on SCr of 0.59 mg/dL (L)).    No Known Allergies   Thank you for allowing pharmacy to be a part of this patient's care.  Lenis Noon, PharmD 11/15/2020 9:13 AM

## 2020-11-15 NOTE — Progress Notes (Signed)
Pt unable to ambulate in hall due to shortness of breath. Pt had to use BSC for BM as he felt the bathroom was too far. He hopes to feel like ambulating tomorrow and we will check O2 sats then.

## 2020-11-15 NOTE — Progress Notes (Signed)
Progress Note    Luis Brown   EUM:[id number]  DOB: 01-02-1946  DOA: 11/10/2020     4  PCP: Elise Benne  CC: SOB  Hospital Course: Luis Brown is a 75 yo male with PMH vertebral OM (was on 6 wk Dapto course PTA), peripheral neuropathy, HLD, HTN, DMII, DDD, BPH, anxiety/depression who presented to the hospital with SOB.  He initially presented to his PCPs office and was found to be hypoxic.  He was referred to the ER.  He underwent work-up with CTA chest which showed a large right sided PNA with ground glass nodules and concern for central cavitation.  He was started on IV antibiotics on admission.  Infectious disease was consulted given prolonged antibiotic course at home and now with atypical pneumonia.  Blood cultures were obtained on admission.  Repeat MRI T-spine and echo were also ordered. The echo was negative for valvular vegetation.  MRI was negative for deep infection but did reveal a subcutaneous probable seroma.  IR was consulted per ID for aspiration/drainage and culturing of fluid.  He underwent aspiration on 11/13/2020.  Fluid studies were sent for culture and did not grow (appearance was also non-infectious).    Interval History:  Very anxious this morning.  He feels short of breath and gets easily worked up due to the underlying right-sided chest pain with breathing and the subjective sensation that he cannot catch his breath.  He is very anxious about going home in general and his wife being unable to care for him. Long discussion held bedside including providing reassurance and discussing further work-up including repeat CTA chest this morning.   ROS: Constitutional: negative for chills and fevers, Respiratory: positive for cough, Cardiovascular: negative for chest pain and Gastrointestinal: negative for abdominal pain  Assessment & Plan: * CAP (community acquired pneumonia) - CAP with higher risk MDRO due to prolonged IV abx (dapto) and recent exposure to  healthcare - continue vanc, cefepime. - d/c azithro. Added flagyl 4/27 - ID consulted - CTA read on 4/26 suggests right sided cavitary lesions - follow up blood cultures (NGTD). Keep PICC in place until we are done with IV abx - MRI T spine shows probable seroma; IR consulted per ID and plan is for aspiration/drainage on 11/13/20 (3cc drained, non infected appearing) - follow up fluid studies from aspiration: no organisms - continue flutter and IS - ambulate Qshift; PT ordered  - patient not improving; placed on O2 on 4/30 and now having worsening pleuritic CP, SOB, cough (anxiety also driving large component of this). Discussed with pulmonology, rec'd to continue vanc/cefepime/flagyl for at least 7 days. No steroids at this time - will also add morphine for his pleuritic CP (related to cavitations) and for his air hunger sensation  Acute respiratory failure with hypoxia ([place]) - Has not been on oxygen at all during hospitalization but started requiring on 4/30.  - may be due to evolution of PNA vs development of effusion - Repeat CTA chest given several days since last with clinical change - Continue oxygen.  Ultimately will need a walk test prior to discharge if needs home O2 -Continue incentive spirometer and flutter  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HCC) - s/p removal of hardware of T5, T6 pedicle screws/rods, exploration of fusion on 09/14/20 - diagnosed with hardware associated Staph Epi OM. Seen by ID on 09/28/20 (prior to this had also been on a prolonged abx course for prior infection). Plan was: 6 weeks Dapto followed by Bactrim  up to 3 months if tolerates - seen by ID this admission again; continue on Vanc for now while in hospital  - repeat MRI T-spine 11/11/20: no epidural abscess. New fluid collection in SQ tissue T3-4 to T6-7 measuring 6 cm, considered a seroma (to be aspirated for culture on 4/29) -Will transition vancomycin to Bactrim once pneumonia further  treated  Sepsis ([place]) - tachycardia, tachypnea, pneumonia source - see CAP  BPH (benign prostatic hyperplasia) - Continue Flomax  Hyperlipidemia - Continue Lipitor  Diabetes mellitus without complication ([place]) - continue SSI and CBG monitoring  - last A1c 6.3% on 09/08/20  HTN (hypertension) - Continue Avapro  Hyponatremia-resolved as of 11/11/2020 - Na 130 on admission; suspected hypovolemia - improved   Old records reviewed in assessment of this patient  Antimicrobials: Azithromycin 11/11/20 x 1 Vancomycin 11/10/2020 >> current Cefepime 11/10/2020 >> current  Flagyl 11/11/2020 >> current  DVT prophylaxis: enoxaparin (LOVENOX) injection 40 mg Start: 11/11/20 1000   Code Status:   Code Status: Full Code Family Communication: wife  Disposition Plan: Status is: Inpatient  Remains inpatient appropriate because:Ongoing diagnostic testing needed not appropriate for outpatient work up, IV treatments appropriate due to intensity of illness or inability to take PO and Inpatient level of care appropriate due to severity of illness   Dispo: The patient is from: Home              Anticipated d/c is to: Home              Patient currently is not medically stable to d/c.   Difficult to place patient No  Risk of unplanned readmission score: Unplanned Admission- Pilot do not use: 18.2   Objective: Blood pressure 134/68, pulse 92, temperature 98.2 F (36.8 C), temperature source Oral, resp. rate (!) 22, height 5\' 10"  (1.778 m), weight 75.9 kg, SpO2 92 %.  Examination: General appearance: Very anxious appearing.  No obvious distress but does appear uncomfortable from pleurisy and shortness of breath Head: Normocephalic, without obvious abnormality, atraumatic Eyes: EOMI Lungs: Right basilar rhonchi.  Clearing breath sounds elsewhere.  No wheezing  Heart: regular rate and rhythm and S1, S2 normal Abdomen: normal findings: bowel sounds normal and soft, non-tender Extremities: no  edema; RUE PICC in place with no surrounding signs of infection Skin: mobility and turgor normal Neurologic: Grossly normal  Consultants:   ID  IR  Procedures:   Thoracic fluid collection drainage with IR: 11/13/2020  Data Reviewed: I have personally reviewed following labs and imaging studies Results for orders placed or performed during the hospital encounter of 11/10/20 (from the past 24 hour(s))  Glucose, capillary     Status: Abnormal   Collection Time: 11/14/20 11:10 AM  Result Value Ref Range   Glucose-Capillary 182 (H) 70 - 99 mg/dL  Glucose, capillary     Status: Abnormal   Collection Time: 11/14/20  4:17 PM  Result Value Ref Range   Glucose-Capillary 202 (H) 70 - 99 mg/dL  Glucose, capillary     Status: Abnormal   Collection Time: 11/14/20  9:11 PM  Result Value Ref Range   Glucose-Capillary 173 (H) 70 - 99 mg/dL  Basic metabolic panel     Status: Abnormal   Collection Time: 11/15/20  3:13 AM  Result Value Ref Range   Sodium 133 (L) 135 - 145 mmol/L   Potassium 3.3 (L) 3.5 - 5.1 mmol/L   Chloride 99 98 - 111 mmol/L   CO2 24 22 - 32 mmol/L  Glucose, Bld 176 (H) 70 - 99 mg/dL   BUN 15 8 - 23 mg/dL   Creatinine, Ser 0.59 (L) 0.61 - 1.24 mg/dL   Calcium 8.1 (L) 8.9 - 10.3 mg/dL   GFR, Estimated >60 >60 mL/min   Anion gap 10 5 - 15  CBC with Differential/Platelet     Status: Abnormal   Collection Time: 11/15/20  3:13 AM  Result Value Ref Range   WBC 11.5 (H) 4.0 - 10.5 K/uL   RBC 3.73 (L) 4.22 - 5.81 MIL/uL   Hemoglobin 11.2 (L) 13.0 - 17.0 g/dL   HCT 34.2 (L) 39.0 - 52.0 %   MCV 91.7 80.0 - 100.0 fL   MCH 30.0 26.0 - 34.0 pg   MCHC 32.7 30.0 - 36.0 g/dL   RDW 13.8 11.5 - 15.5 %   Platelets 247 150 - 400 K/uL   nRBC 0.0 0.0 - 0.2 %   Neutrophils Relative % 79 %   Neutro Abs 9.0 (H) 1.7 - 7.7 K/uL   Lymphocytes Relative 4 %   Lymphs Abs 0.5 (L) 0.7 - 4.0 K/uL   Monocytes Relative 11 %   Monocytes Absolute 1.3 (H) 0.1 - 1.0 K/uL   Eosinophils Relative 5  %   Eosinophils Absolute 0.6 (H) 0.0 - 0.5 K/uL   Basophils Relative 0 %   Basophils Absolute 0.0 0.0 - 0.1 K/uL   Immature Granulocytes 1 %   Abs Immature Granulocytes 0.08 (H) 0.00 - 0.07 K/uL  Magnesium     Status: None   Collection Time: 11/15/20  3:13 AM  Result Value Ref Range   Magnesium 1.9 1.7 - 2.4 mg/dL  Glucose, capillary     Status: Abnormal   Collection Time: 11/15/20  7:22 AM  Result Value Ref Range   Glucose-Capillary 165 (H) 70 - 99 mg/dL   *Note: Due to a large number of results and/or encounters for the requested time period, some results have not been displayed. A complete set of results can be found in Results Review.    Recent Results (from the past 240 hour(s))  Resp Panel by RT-PCR (Flu A&B, Covid) Nasopharyngeal Swab     Status: None   Collection Time: 11/10/20  4:54 PM   Specimen: Nasopharyngeal Swab; Nasopharyngeal(NP) swabs in vial transport medium  Result Value Ref Range Status   SARS Coronavirus 2 by RT PCR NEGATIVE NEGATIVE Final    Comment: (NOTE) SARS-CoV-2 target nucleic acids are NOT DETECTED.  The SARS-CoV-2 RNA is generally detectable in upper respiratory specimens during the acute phase of infection. The lowest concentration of SARS-CoV-2 viral copies this assay can detect is 138 copies/mL. A negative result does not preclude SARS-Cov-2 infection and should not be used as the sole basis for treatment or other patient management decisions. A negative result may occur with  improper specimen collection/handling, submission of specimen other than nasopharyngeal swab, presence of viral mutation(s) within the areas targeted by this assay, and inadequate number of viral copies(<138 copies/mL). A negative result must be combined with clinical observations, patient history, and epidemiological information. The expected result is Negative.  Fact Sheet for Patients:  EntrepreneurPulse.com.au  Fact Sheet for Healthcare Providers:   IncredibleEmployment.be  This test is no t yet approved or cleared by the [place] FDA and  has been authorized for detection and/or diagnosis of SARS-CoV-2 by FDA under an Emergency Use Authorization (EUA). This EUA will remain  in effect (meaning this test can be used) for the duration of the  COVID-19 declaration under Section 564(b)(1) of the Act, 21 U.S.C.section 360bbb-3(b)(1), unless the authorization is terminated  or revoked sooner.       Influenza A by PCR NEGATIVE NEGATIVE Final   Influenza B by PCR NEGATIVE NEGATIVE Final    Comment: (NOTE) The Xpert Xpress SARS-CoV-2/FLU/RSV plus assay is intended as an aid in the diagnosis of influenza from Nasopharyngeal swab specimens and should not be used as a sole basis for treatment. Nasal washings and aspirates are unacceptable for Xpert Xpress SARS-CoV-2/FLU/RSV testing.  Fact Sheet for Patients: EntrepreneurPulse.com.au  Fact Sheet for Healthcare Providers: IncredibleEmployment.be  This test is not yet approved or cleared by the [place] FDA and has been authorized for detection and/or diagnosis of SARS-CoV-2 by FDA under an Emergency Use Authorization (EUA). This EUA will remain in effect (meaning this test can be used) for the duration of the COVID-19 declaration under Section 564(b)(1) of the Act, 21 U.S.C. section 360bbb-3(b)(1), unless the authorization is terminated or revoked.  Performed at [facility], [address] [place], [place] [address]   Culture, blood (routine x 2)     Status: None (Preliminary result)   Collection Time: 11/10/20  6:04 PM   Specimen: BLOOD  Result Value Ref Range Status   Specimen Description   Final    BLOOD PICC LINE Performed at [facility], [address] [place], [place] [address]    Special Requests   Final    BOTTLES DRAWN AEROBIC AND ANAEROBIC Blood Culture adequate volume Performed  at [facility], [address] [place], [place] [address]    Culture   Final    NO GROWTH 3 DAYS Performed at [facility], [place] [address] [place], [place] [address]    Report Status PENDING  Incomplete  Culture, blood (routine x 2)     Status: None (Preliminary result)   Collection Time: 11/10/20  6:05 PM   Specimen: BLOOD LEFT ARM  Result Value Ref Range Status   Specimen Description   Final    BLOOD LEFT ARM Performed at [facility], [address] [place], [place] [address]    Special Requests   Final    BOTTLES DRAWN AEROBIC AND ANAEROBIC Blood Culture adequate volume Performed at [facility], [address] [place], [place] [address]    Culture   Final    NO GROWTH 3 DAYS Performed at [facility], [place] [address] [place], [place] [address]    Report Status PENDING  Incomplete  Respiratory (~20 pathogens) panel by PCR     Status: None   Collection Time: 11/12/20  1:51 PM  Result Value Ref Range Status   Adenovirus NOT DETECTED NOT DETECTED Final   Coronavirus 229E NOT DETECTED NOT DETECTED Final    Comment: (NOTE) The Coronavirus on the Respiratory Panel, DOES NOT test for the novel  Coronavirus (2019 nCoV)    Coronavirus HKU1 NOT DETECTED NOT DETECTED Final   Coronavirus NL63 NOT DETECTED NOT DETECTED Final   Coronavirus OC43 NOT DETECTED NOT DETECTED Final   Metapneumovirus NOT DETECTED NOT DETECTED Final   Rhinovirus / Enterovirus NOT DETECTED NOT DETECTED Final   Influenza A NOT DETECTED NOT DETECTED Final   Influenza B NOT DETECTED NOT DETECTED Final   Parainfluenza Virus 1 NOT DETECTED NOT DETECTED Final   Parainfluenza Virus 2 NOT DETECTED NOT DETECTED Final   Parainfluenza Virus 3 NOT  DETECTED NOT DETECTED Final   Parainfluenza Virus 4 NOT DETECTED NOT DETECTED Final   Respiratory Syncytial Virus NOT DETECTED NOT DETECTED Final   Bordetella pertussis NOT DETECTED NOT DETECTED Final   Bordetella  Parapertussis NOT DETECTED NOT DETECTED Final   Chlamydophila pneumoniae NOT DETECTED NOT DETECTED Final   Mycoplasma pneumoniae NOT DETECTED NOT DETECTED Final    Comment: Performed at [facility], [place] [address], [place], [place] [address]  Aerobic/Anaerobic Culture (surgical/deep wound)     Status: None (Preliminary result)   Collection Time: 11/13/20  8:47 AM   Specimen: Thoracic; Wound  Result Value Ref Range Status   Specimen Description   Final    THORACIC T5 T6 Performed at [place] [address] [place], [place] [address]    Special Requests   Final    Normal Performed at [facility], [place] [address], [place], [place] [address]    Gram Stain   Final    FEW WBC PRESENT,BOTH PMN AND MONONUCLEAR NO ORGANISMS SEEN    Culture   Final    NO GROWTH 1 DAY Performed at [facility], [place] [address] [place],  [address]    Report Status PENDING  Incomplete     Radiology Studies: No results found. IR US Guide Bx Asp/Drain  Final Result    MR THORACIC SPINE W WO CONTRAST  Final Result    CT Angio Chest PE W/Cm &/Or Wo Cm  Final Result    DG Chest Port 1 View  Final Result    CT ANGIO CHEST PE W OR WO CONTRAST    (Results Pending)    Scheduled Meds: . atorvastatin  10 mg Oral Daily  . busPIRone  7.5 mg Oral BID  . Chlorhexidine Gluconate Cloth  6 each Topical Daily  . dextromethorphan-guaiFENesin  1 tablet Oral BID  . enoxaparin (LOVENOX) injection  40 mg Subcutaneous Q24H  . famotidine  20 mg Oral Daily  . Ferrous Fumarate  1 tablet Oral Daily  . insulin aspart  0-9 Units Subcutaneous TID WC  . ipratropium-albuterol  3 mL Nebulization TID  . irbesartan  37.5 mg Oral Daily  . metroNIDAZOLE  500 mg Oral Q8H  . pantoprazole  40 mg Oral BID AC  . pregabalin  75 mg Oral BID  . sucralfate  1 g Oral TID WC & HS  . tamsulosin  0.8 mg Oral Daily  . venlafaxine XR  150 mg Oral Q breakfast   PRN Meds:  acetaminophen, ipratropium-albuterol, morphine injection, sodium chloride flush, tiZANidine, zolpidem Continuous Infusions: . ceFEPime (MAXIPIME) IV 2 g (11/15/20 0330)  . vancomycin       LOS: 4 days  Time spent: Greater than 50% of the 35 minute visit was spent in counseling/coordination of care for the patient as laid out in the A&P.   Dwyane Dee, MD [facility] 11/15/2020, 9:12 AM

## 2020-11-16 ENCOUNTER — Inpatient Hospital Stay (HOSPITAL_COMMUNITY): Payer: Medicare Other

## 2020-11-16 DIAGNOSIS — I82A11 Acute embolism and thrombosis of right axillary vein: Secondary | ICD-10-CM | POA: Diagnosis not present

## 2020-11-16 DIAGNOSIS — J90 Pleural effusion, not elsewhere classified: Secondary | ICD-10-CM | POA: Diagnosis not present

## 2020-11-16 DIAGNOSIS — J189 Pneumonia, unspecified organism: Secondary | ICD-10-CM | POA: Diagnosis not present

## 2020-11-16 DIAGNOSIS — I82409 Acute embolism and thrombosis of unspecified deep veins of unspecified lower extremity: Secondary | ICD-10-CM

## 2020-11-16 DIAGNOSIS — J9601 Acute respiratory failure with hypoxia: Secondary | ICD-10-CM | POA: Diagnosis not present

## 2020-11-16 DIAGNOSIS — B957 Other staphylococcus as the cause of diseases classified elsewhere: Secondary | ICD-10-CM | POA: Diagnosis not present

## 2020-11-16 DIAGNOSIS — M7989 Other specified soft tissue disorders: Secondary | ICD-10-CM | POA: Diagnosis not present

## 2020-11-16 DIAGNOSIS — T8149XA Infection following a procedure, other surgical site, initial encounter: Secondary | ICD-10-CM

## 2020-11-16 DIAGNOSIS — A419 Sepsis, unspecified organism: Secondary | ICD-10-CM | POA: Diagnosis not present

## 2020-11-16 DIAGNOSIS — T847XXD Infection and inflammatory reaction due to other internal orthopedic prosthetic devices, implants and grafts, subsequent encounter: Secondary | ICD-10-CM | POA: Diagnosis not present

## 2020-11-16 LAB — CBC WITH DIFFERENTIAL/PLATELET
Abs Immature Granulocytes: 0.12 10*3/uL — ABNORMAL HIGH (ref 0.00–0.07)
Basophils Absolute: 0.1 10*3/uL (ref 0.0–0.1)
Basophils Relative: 0 %
Eosinophils Absolute: 0.6 10*3/uL — ABNORMAL HIGH (ref 0.0–0.5)
Eosinophils Relative: 5 %
HCT: 36.9 % — ABNORMAL LOW (ref 39.0–52.0)
Hemoglobin: 12 g/dL — ABNORMAL LOW (ref 13.0–17.0)
Immature Granulocytes: 1 %
Lymphocytes Relative: 4 %
Lymphs Abs: 0.5 10*3/uL — ABNORMAL LOW (ref 0.7–4.0)
MCH: 29.9 pg (ref 26.0–34.0)
MCHC: 32.5 g/dL (ref 30.0–36.0)
MCV: 92 fL (ref 80.0–100.0)
Monocytes Absolute: 1.2 10*3/uL — ABNORMAL HIGH (ref 0.1–1.0)
Monocytes Relative: 11 %
Neutro Abs: 8.7 10*3/uL — ABNORMAL HIGH (ref 1.7–7.7)
Neutrophils Relative %: 79 %
Platelets: 214 10*3/uL (ref 150–400)
RBC: 4.01 MIL/uL — ABNORMAL LOW (ref 4.22–5.81)
RDW: 13.8 % (ref 11.5–15.5)
WBC: 11.2 10*3/uL — ABNORMAL HIGH (ref 4.0–10.5)
nRBC: 0 % (ref 0.0–0.2)

## 2020-11-16 LAB — GLUCOSE, CAPILLARY
Glucose-Capillary: 145 mg/dL — ABNORMAL HIGH (ref 70–99)
Glucose-Capillary: 236 mg/dL — ABNORMAL HIGH (ref 70–99)
Glucose-Capillary: 158 mg/dL — ABNORMAL HIGH (ref 70–99)
Glucose-Capillary: 311 mg/dL — ABNORMAL HIGH (ref 70–99)

## 2020-11-16 LAB — BASIC METABOLIC PANEL
Anion gap: 10 (ref 5–15)
BUN: 14 mg/dL (ref 8–23)
CO2: 23 mmol/L (ref 22–32)
Calcium: 8.5 mg/dL — ABNORMAL LOW (ref 8.9–10.3)
Chloride: 103 mmol/L (ref 98–111)
Creatinine, Ser: 0.57 mg/dL — ABNORMAL LOW (ref 0.61–1.24)
GFR, Estimated: >60 mL/min (ref >60)
Glucose, Bld: 179 mg/dL — ABNORMAL HIGH (ref 70–99)
Potassium: 4.1 mmol/L (ref 3.5–5.1)
Sodium: 136 mmol/L (ref 135–145)

## 2020-11-16 LAB — CULTURE, BLOOD (ROUTINE X 2)
Culture: NO GROWTH
Culture: NO GROWTH
Special Requests: ADEQUATE
Special Requests: ADEQUATE

## 2020-11-16 LAB — PROCALCITONIN: Procalcitonin: 0.11 ng/mL

## 2020-11-16 LAB — MAGNESIUM: Magnesium: 1.8 mg/dL (ref 1.7–2.4)

## 2020-11-16 LAB — HEPARIN LEVEL (UNFRACTIONATED): Heparin Unfractionated: <0.1 IU/mL — ABNORMAL LOW (ref 0.30–0.70)

## 2020-11-16 MED ORDER — ALPRAZOLAM 0.5 MG PO TABS
0.5000 mg | ORAL_TABLET | Freq: Two times a day (BID) | ORAL | Status: DC | PRN
  Administered 2020-11-16 – 2020-11-23 (×13): 0.5 mg via ORAL
  Filled 2020-11-16 (×14): qty 1

## 2020-11-16 MED ORDER — HEPARIN (PORCINE) 25000 UT/250ML-% IV SOLN
1550.0000 [IU]/h | INTRAVENOUS | Status: AC
  Administered 2020-11-16: 1300 [IU]/h via INTRAVENOUS
  Filled 2020-11-16 (×3): qty 250

## 2020-11-16 MED ORDER — ALTEPLASE 2 MG IJ SOLR
2.0000 mg | Freq: Once | INTRAMUSCULAR | Status: AC
  Administered 2020-11-16: 2 mg
  Filled 2020-11-16: qty 2

## 2020-11-16 NOTE — Progress Notes (Signed)
ANTICOAGULATION CONSULT NOTE   Pharmacy Consult for Heparin Indication: DVT  No Known Allergies  Patient Measurements: Height: 5\' 10"  (177.8 cm) Weight: 75.9 kg (167 lb 5.3 oz) IBW/kg (Calculated) : 73 Heparin Dosing Weight: 76 kg  Vital Signs: Temp: 98.3 F (36.8 C) (05/02 0429) Temp Source: Oral (05/02 0429) BP: 140/80 (05/02 0429) Pulse Rate: 88 (05/02 0429)  Labs: Recent Labs    11/14/20 0421 11/15/20 0313 11/16/20 0452  HGB 10.6* 11.2* 12.0*  HCT 32.7* 34.2* 36.9*  PLT 270 247 214  CREATININE 0.65 0.59* 0.57*    Estimated Creatinine Clearance: 79.8 mL/min (A) (by C-G formula based on SCr of 0.57 mg/dL (L)).   Medical History: Past Medical History:  Diagnosis Date  . Acute pharyngitis 10/21/2013  . Allergy    grass and pollen  . Anxiety and depression 10/25/2011  . BPH (benign prostatic hyperplasia) 04/23/2012  . Chicken pox as a child  . DDD (degenerative disc disease)    cervical responds to steroid injections and low back required surgery  . DDD (degenerative disc disease), lumbosacral   . Diabetes mellitus    pre  . Dyspnea   . ED (erectile dysfunction) 04/23/2012  . Elevated BP   . 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 Esophageal reflux 02/10/2015  . Fatigue   . HTN (hypertension)   . Hyperglycemia    preDM   . Hyperlipidemia   . Insomnia   . Low back pain radiating to both legs 01/16/2017  . Measles as a child  . Medicare annual wellness visit, subsequent 02/10/2015  . Overweight(278.02)   . Peripheral neuropathy 08/07/2019  . Personal history of colonic polyps 10/27/2012   Follows with [facility]  . Pneumonia    " walking"  . Preventative health care   . 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  . Wears glasses    Medications:  Scheduled:  . atorvastatin  10 mg Oral Daily  . busPIRone  7.5 mg Oral BID  . Chlorhexidine Gluconate Cloth  6 each Topical Daily  . dextromethorphan-guaiFENesin  1 tablet Oral BID  . famotidine  20 mg Oral Daily  .  Ferrous Fumarate  1 tablet Oral Daily  . insulin aspart  0-9 Units Subcutaneous TID WC  . ipratropium-albuterol  3 mL Nebulization TID  . irbesartan  37.5 mg Oral Daily  . metroNIDAZOLE  500 mg Oral Q8H  . pantoprazole  40 mg Oral BID AC  . pregabalin  75 mg Oral BID  . sucralfate  1 g Oral TID WC & HS  . tamsulosin  0.8 mg Oral Daily  . testosterone cypionate  200 mg Intramuscular Once  . venlafaxine XR  150 mg Oral Q breakfast   Infusions:  . ceFEPime (MAXIPIME) IV 2 g (11/16/20 0333)  . heparin    . vancomycin 1,000 mg (11/16/20 1034)    Assessment: 16 yoM with vertebral osteo, R PICC for IV abx PTA, now with RUE DVT.  Was on Lovenox 40mg  SQ qd for VTE prophylaxis, last dose today 5/2 at 10am. PICC line removed, single L hand IV site, will need another IV site for Abx.  Begin Heparin 5/2, plan to stop Heparin 5/3 am for Bronchoscopy  Goal of Therapy:  Heparin level 0.3-0.7 units/ml Monitor platelets by anticoagulation protocol: Yes   Plan:  Begin Heparin infusion at 1300 units/hr, no loading dose since Lovenox given this am Check 6 hr Heparin level Daily CBC, order daily Hep level at steady state Stop Heparin infusion on 5/3 at  0730 am, hold Heparin 3 hr prior to Bronch at 1030am  Minda Ditto PharmD 11/16/2020,1:09 PM

## 2020-11-16 NOTE — Consult Note (Signed)
NAME:  Luis Brown, MRN:  [id number], DOB:  [birth year], LOS: 5 ADMISSION DATE:  11/10/2020, CONSULTATION DATE:  11/16/2020 REFERRING MD:  Dr Cleophas Dunker, CHIEF COMPLAINT:  pneumonia   History of Present Illness:  Patient was admitted with shortness of breath Feels his breathing has been getting worse the last couple of weeks   Was home on antibiotics and daptomycin for osteomyelitis, shortness of breath diaphoresis noted to be hypoxemic in the office and was referred to the emergency department for evaluation, since has been in the hospital he feels his breathing did worsen  Started on empiric antibiotics for pneumonia however breathing continues to worsen, had a repeat CT scan showing progressive infiltration  In the right lung Pertinent  Medical History   Past Medical History:  Diagnosis Date  . Acute pharyngitis 10/21/2013  . Allergy    grass and pollen  . Anxiety and depression 10/25/2011  . BPH (benign prostatic hyperplasia) 04/23/2012  . Chicken pox as a child  . DDD (degenerative disc disease)    cervical responds to steroid injections and low back required surgery  . DDD (degenerative disc disease), lumbosacral   . Diabetes mellitus    pre  . Dyspnea   . ED (erectile dysfunction) 04/23/2012  . Elevated BP   . 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 Esophageal reflux 02/10/2015  . Fatigue   . HTN (hypertension)   . Hyperglycemia    preDM   . Hyperlipidemia   . Insomnia   . Low back pain radiating to both legs 01/16/2017  . Measles as a child  . Medicare annual wellness visit, subsequent 02/10/2015  . Overweight(278.02)   . Peripheral neuropathy 08/07/2019  . Personal history of colonic polyps 10/27/2012   Follows with [facility]  . Pneumonia    " walking"  . Preventative health care   . 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  . Wears glasses      Significant Hospital Events: Including procedures, antibiotic start and stop dates in addition to other pertinent events    . Remains on antibiotic  Interim History / Subjective:  Breathing feels about the same but worse since admission  Objective   Blood pressure 140/80, pulse 88, temperature 98.3 F (36.8 C), temperature source Oral, resp. rate (!) 22, height 5\' 10"  (1.778 m), weight 75.9 kg, SpO2 91 %.        Intake/Output Summary (Last 24 hours) at 11/16/2020 1551 Last data filed at 11/16/2020 0730 Gross per 24 hour  Intake 867.73 ml  Output 1925 ml  Net -1057.27 ml   Filed Weights   11/10/20 2230  Weight: 75.9 kg    Examination: General: Elderly gentleman, does not appear to be in acute distress, does appear short of breath with activity HENT: Moist oral mucosa Lungs: Decreased air entry bilaterally with rales at right base Cardiovascular: S1-S2 appreciated Abdomen: Bowel sounds appreciated Extremities: No clubbing, no edema Neuro: Alert oriented x3 GU:   Labs/imaging that I havepersonally reviewed  (right click and "Reselect all SmartList Selections" daily)  CT scan reviewed with the patient  Resolved Hospital Problem list     Assessment & Plan:  Progressive pneumonia Pleural effusion On vancomycin and cefepime -Bronchoscopy discussed with patient We will schedule patient for bronchoscopy -Risks with bronchoscopy discussed with patient including bleeding, pneumothorax discussed with patient, respiratory decompensation also discussed -Tentatively scheduled for 11/17/2020 at 1030  Informed about upper extremity DVT for which she will be on anticoagulation -This needs to be held for about 2 to  3 hours prior to bronchoscopy  Subacute osteomyelitis of the thoracic spine  Sepsis  BPH  Hyperlipidemia  Further management per primary  We will follow with you  Sherrilyn Rist, MD [facility] Pager: [phone]

## 2020-11-16 NOTE — Progress Notes (Signed)
Right upper extremity venous duplex has been completed. Preliminary results can be found in CV Proc through chart review.  Results were given to the patient's nurse, [place].  11/16/20 12:17 PM Carlos Levering RVT

## 2020-11-16 NOTE — Assessment & Plan Note (Addendum)
-   Patient started complaining of some pain and swelling in his right arm on 11/16/2020 - Duplex obtained 5/2, acute DVT noted in right axillary and brachial veins - PICC removed 5/2 - Start on heparin per pharmacy; should be okay to transition to [place] in 1-2 days pending course - heparin to resume after bronch on 5/3 at 4pm

## 2020-11-16 NOTE — Progress Notes (Addendum)
Subjective:  Patient has worsened over the weekend with worsening anxiety dyspnea and now requiring oxygen   Antibiotics:  Anti-infectives (From admission, onward)   Start     Dose/Rate Route Frequency Ordered Stop   11/15/20 1000  sulfamethoxazole-trimethoprim (BACTRIM DS) 800-160 MG per tablet 1 tablet  Status:  Discontinued        1 tablet Oral Every 12 hours 11/13/20 1441 11/15/20 0912   11/15/20 1000  vancomycin (VANCOREADY) IVPB 1000 mg/200 mL        1,000 mg 200 mL/hr over 60 Minutes Intravenous Every 12 hours 11/15/20 0911     11/14/20 2000  ceFEPIme (MAXIPIME) 2 g in sodium chloride 0.9 % 100 mL IVPB        2 g 200 mL/hr over 30 Minutes Intravenous Every 8 hours 11/14/20 1216     11/11/20 1400  metroNIDAZOLE (FLAGYL) tablet 500 mg        500 mg Oral Every 8 hours 11/11/20 0941     11/11/20 0100  azithromycin (ZITHROMAX) 500 mg in sodium chloride 0.9 % 250 mL IVPB  Status:  Discontinued        500 mg 250 mL/hr over 60 Minutes Intravenous Every 24 hours 11/10/20 2324 11/11/20 0941   11/10/20 1830  ceFEPIme (MAXIPIME) 2 g in sodium chloride 0.9 % 100 mL IVPB  Status:  Discontinued        2 g 200 mL/hr over 30 Minutes Intravenous Every 8 hours 11/10/20 1809 11/14/20 1216   11/10/20 1830  vancomycin (VANCOREADY) IVPB 1750 mg/350 mL  Status:  Discontinued        1,750 mg 175 mL/hr over 120 Minutes Intravenous Every 24 hours 11/10/20 1809 11/10/20 1817   11/10/20 1830  vancomycin (VANCOCIN) IVPB 1000 mg/200 mL premix        1,000 mg 200 mL/hr over 60 Minutes Intravenous Every 12 hours 11/10/20 1817 11/14/20 1925   11/10/20 1816  vancomycin (VANCOCIN) 1000 MG powder       Note to Pharmacy: Guerry Bruin   : cabinet override      11/10/20 1816 11/10/20 1946      Medications: Scheduled Meds: . atorvastatin  10 mg Oral Daily  . busPIRone  7.5 mg Oral BID  . Chlorhexidine Gluconate Cloth  6 each Topical Daily  . dextromethorphan-guaiFENesin  1 tablet Oral BID  .  enoxaparin (LOVENOX) injection  40 mg Subcutaneous Q24H  . famotidine  20 mg Oral Daily  . Ferrous Fumarate  1 tablet Oral Daily  . insulin aspart  0-9 Units Subcutaneous TID WC  . ipratropium-albuterol  3 mL Nebulization TID  . irbesartan  37.5 mg Oral Daily  . metroNIDAZOLE  500 mg Oral Q8H  . pantoprazole  40 mg Oral BID AC  . pregabalin  75 mg Oral BID  . sucralfate  1 g Oral TID WC & HS  . tamsulosin  0.8 mg Oral Daily  . testosterone cypionate  200 mg Intramuscular Once  . venlafaxine XR  150 mg Oral Q breakfast   Continuous Infusions: . ceFEPime (MAXIPIME) IV 2 g (11/16/20 0333)  . vancomycin 1,000 mg (11/16/20 1034)   PRN Meds:.acetaminophen, ALPRAZolam, HYDROcodone-acetaminophen, ipratropium-albuterol, morphine injection, sodium chloride, sodium chloride flush, tiZANidine, zolpidem    Objective: Weight change:   Intake/Output Summary (Last 24 hours) at 11/16/2020 1154 Last data filed at 11/16/2020 0730 Gross per 24 hour  Intake 987.73 ml  Output 2500 ml  Net -1512.27 ml  Blood pressure 140/80, pulse 88, temperature 98.3 F (36.8 C), temperature source Oral, resp. rate (!) 22, height _0  (1.778 m), weight 75.9 kg, SpO2 90 %. Temp:  [97.9 F (36.6 C)-98.3 F (36.8 C)] 98.3 F (36.8 C) (05/02 0429) Pulse Rate:  [88-89] 88 (05/02 0429) Resp:  [16-22] 22 (05/02 0429) BP: (128-140)/(66-80) 140/80 (05/02 0429) SpO2:  [90 %-94 %] 90 % (05/02 0823)  Physical Exam: Physical Exam Constitutional:      Appearance: He is well-developed.  HENT:     Head: Normocephalic and atraumatic.  Eyes:     Conjunctiva/sclera: Conjunctivae normal.  Cardiovascular:     Rate and Rhythm: Normal rate and regular rhythm.  Pulmonary:     Effort: Pulmonary effort is normal. No respiratory distress.     Breath sounds: Rhonchi present. No wheezing.  Abdominal:     General: There is no distension.     Palpations: Abdomen is soft.  Musculoskeletal:        General: Normal range of  motion.     Cervical back: Normal range of motion and neck supple.  Skin:    General: Skin is warm and dry.     Findings: No erythema or rash.  Neurological:     General: No focal deficit present.     Mental Status: He is alert and oriented to person, place, and time.  Psychiatric:        Mood and Affect: Mood is anxious.        Behavior: Behavior normal.        Thought Content: Thought content normal.        Judgment: Judgment normal.      CBC:    BMET Recent Labs    11/15/20 0313 11/16/20 0452  NA 133* 136  K 3.3* 4.1  CL 99 103  CO2 24 23  GLUCOSE 176* 179*  BUN 15 14  CREATININE 0.59* 0.57*  CALCIUM 8.1* 8.5*     Liver Panel  No results for input(s): PROT, ALBUMIN, AST, ALT, ALKPHOS, BILITOT, BILIDIR, IBILI in the last 72 hours.     Sedimentation Rate No results for input(s): ESRSEDRATE in the last 72 hours. C-Reactive Protein No results for input(s): CRP in the last 72 hours.  Micro Results: Recent Results (from the past 720 hour(s))  Resp Panel by RT-PCR (Flu A&B, Covid) Nasopharyngeal Swab     Status: None   Collection Time: 11/10/20  4:54 PM   Specimen: Nasopharyngeal Swab; Nasopharyngeal(NP) swabs in vial transport medium  Result Value Ref Range Status   SARS Coronavirus 2 by RT PCR NEGATIVE NEGATIVE Final    Comment: (NOTE) SARS-CoV-2 target nucleic acids are NOT DETECTED.  The SARS-CoV-2 RNA is generally detectable in upper respiratory specimens during the acute phase of infection. The lowest concentration of SARS-CoV-2 viral copies this assay can detect is 138 copies/mL. A negative result does not preclude SARS-Cov-2 infection and should not be used as the sole basis for treatment or other patient management decisions. A negative result may occur with  improper specimen collection/handling, submission of specimen other than nasopharyngeal swab, presence of viral mutation(s) within the areas targeted by this assay, and inadequate number of  viral copies(<138 copies/mL). A negative result must be combined with clinical observations, patient history, and epidemiological information. The expected result is Negative.  Fact Sheet for Patients:  EntrepreneurPulse.com.au  Fact Sheet for Healthcare Providers:  IncredibleEmployment.be  This test is no t yet approved or cleared by the [place] FDA  and  has been authorized for detection and/or diagnosis of SARS-CoV-2 by FDA under an Emergency Use Authorization (EUA). This EUA will remain  in effect (meaning this test can be used) for the duration of the COVID-19 declaration under Section 564(b)(1) of the Act, 21 U.S.C.section 360bbb-3(b)(1), unless the authorization is terminated  or revoked sooner.       Influenza A by PCR NEGATIVE NEGATIVE Final   Influenza B by PCR NEGATIVE NEGATIVE Final    Comment: (NOTE) The Xpert Xpress SARS-CoV-2/FLU/RSV plus assay is intended as an aid in the diagnosis of influenza from Nasopharyngeal swab specimens and should not be used as a sole basis for treatment. Nasal washings and aspirates are unacceptable for Xpert Xpress SARS-CoV-2/FLU/RSV testing.  Fact Sheet for Patients: EntrepreneurPulse.com.au  Fact Sheet for Healthcare Providers: IncredibleEmployment.be  This test is not yet approved or cleared by the [place] FDA and has been authorized for detection and/or diagnosis of SARS-CoV-2 by FDA under an Emergency Use Authorization (EUA). This EUA will remain in effect (meaning this test can be used) for the duration of the COVID-19 declaration under Section 564(b)(1) of the Act, 21 U.S.C. section 360bbb-3(b)(1), unless the authorization is terminated or revoked.  Performed at [facility], [address] [place], [place] [address]   Culture, blood (routine x 2)     Status: None   Collection Time: 11/10/20  6:04 PM   Specimen: BLOOD   Result Value Ref Range Status   Specimen Description   Final    BLOOD PICC LINE Performed at [facility], [address] [place], [place] [address]    Special Requests   Final    BOTTLES DRAWN AEROBIC AND ANAEROBIC Blood Culture adequate volume Performed at [facility], [address] [place], [place] [address]    Culture   Final    NO GROWTH 5 DAYS Performed at [facility], [place] [address], [place], [place] [address]    Report Status 11/16/2020 FINAL  Final  Culture, blood (routine x 2)     Status: None   Collection Time: 11/10/20  6:05 PM   Specimen: BLOOD LEFT ARM  Result Value Ref Range Status   Specimen Description   Final    BLOOD LEFT ARM Performed at [facility], [address] [place], [place] [address]    Special Requests   Final    BOTTLES DRAWN AEROBIC AND ANAEROBIC Blood Culture adequate volume Performed at [facility], [address] [place], [place] [address]    Culture   Final    NO GROWTH 5 DAYS Performed at [facility], [place] [address], [place], [place] [address]    Report Status 11/16/2020 FINAL  Final  Respiratory (~20 pathogens) panel by PCR     Status: None   Collection Time: 11/12/20  1:51 PM  Result Value Ref Range Status   Adenovirus NOT DETECTED NOT DETECTED Final   Coronavirus 229E NOT DETECTED NOT DETECTED Final    Comment: (NOTE) The Coronavirus on the Respiratory Panel, DOES NOT test for the novel  Coronavirus (2019 nCoV)    Coronavirus HKU1 NOT DETECTED NOT DETECTED Final   Coronavirus NL63 NOT DETECTED NOT DETECTED Final   Coronavirus OC43 NOT DETECTED NOT DETECTED Final   Metapneumovirus NOT DETECTED NOT DETECTED Final   Rhinovirus / Enterovirus NOT DETECTED NOT DETECTED Final   Influenza A NOT DETECTED NOT DETECTED Final  Influenza B NOT DETECTED NOT DETECTED Final   Parainfluenza Virus 1 NOT DETECTED NOT DETECTED Final   Parainfluenza Virus 2 NOT DETECTED NOT DETECTED  Final   Parainfluenza Virus 3 NOT DETECTED NOT DETECTED Final   Parainfluenza Virus 4 NOT DETECTED NOT DETECTED Final   Respiratory Syncytial Virus NOT DETECTED NOT DETECTED Final   Bordetella pertussis NOT DETECTED NOT DETECTED Final   Bordetella Parapertussis NOT DETECTED NOT DETECTED Final   Chlamydophila pneumoniae NOT DETECTED NOT DETECTED Final   Mycoplasma pneumoniae NOT DETECTED NOT DETECTED Final    Comment: Performed at [facility], [place] [address], [place], [place] [address]  Aerobic/Anaerobic Culture (surgical/deep wound)     Status: None (Preliminary result)   Collection Time: 11/13/20  8:47 AM   Specimen: Thoracic; Wound  Result Value Ref Range Status   Specimen Description   Final    THORACIC T5 T6 Performed at [place] [address], [place], [place] [address]    Special Requests   Final    Normal Performed at [facility], [place] [address] [place], [place] [address]    Gram Stain   Final    FEW WBC PRESENT,BOTH PMN AND MONONUCLEAR NO ORGANISMS SEEN    Culture   Final    NO GROWTH 2 DAYS NO ANAEROBES ISOLATED; CULTURE IN PROGRESS FOR 5 DAYS Performed at [facility], [place] [address] [place], [place] [address]    Report Status PENDING  Incomplete    Studies/Results: CT ANGIO CHEST PE W OR WO CONTRAST  Result Date: 11/15/2020 CLINICAL DATA:  Chest pain and shortness of breath. EXAM: CT ANGIOGRAPHY CHEST WITH CONTRAST TECHNIQUE: Multidetector CT imaging of the chest was performed using the standard protocol during bolus administration of intravenous contrast. Multiplanar CT image reconstructions and MIPs were obtained to evaluate the vascular anatomy. CONTRAST:  141m OMNIPAQUE IOHEXOL 350 MG/ML SOLN COMPARISON:  11/10/2020 FINDINGS: Cardiovascular: Heart size upper normal to mildly increased. Coronary artery calcification is evident. Atherosclerotic calcification is noted in the wall of the thoracic aorta. There is  no filling defect within the opacified pulmonary arteries to suggest the presence of an acute pulmonary embolus. Mediastinum/Nodes: Mediastinal lymphadenopathy again noted. 13 mm precarinal lymph node evident. 13 mm short axis subcarinal lymph node. Mild right hilar lymphadenopathy. The esophagus has normal imaging features. There is no axillary lymphadenopathy. Lungs/Pleura: Interval progression of diffuse airspace disease in the right lung with ground-glass and consolidative components involving all 3 lobes. Small right pleural effusion associated. There is some minimal airspace disease in the anterior left upper lobe, similar to prior. Upper Abdomen: Unremarkable. Musculoskeletal: Extensive thoracolumbar fusion, incompletely visualized with evidence of prior hardware retrieval in the thoracic spine. Review of the MIP images confirms the above findings. IMPRESSION: 1. No CT evidence for acute pulmonary embolus. 2. Interval progression of diffuse airspace disease in the right lung with ground-glass and consolidative components involving all 3 lobes. Imaging features compatible with multifocal pneumonia. 3. Small right pleural effusion. 4. Mediastinal and right hilar lymphadenopathy, likely reactive. 5. Aortic Atherosclerosis (ICD10-I70.0). Electronically Signed   By: EMisty StanleyM.D.   On: 11/15/2020 09:17      Assessment/Plan:  Patient worsened clinically over the weekend.  Repeat CT scan of the chest failed to show pulmonary embolism but showed further consolidation of right upper and middle and lower lobes  Principal Problem:   CAP (community acquired pneumonia) Active Problems:   HTN (hypertension)   Diabetes mellitus  without complication (HCC)   Hyperlipidemia   BPH (benign prostatic hyperplasia)   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HCC)   Sepsis (HCC)   Fluid collection at surgical site   Hypoxia   Wound infection   Acute respiratory failure with hypoxia ([place])    Luis Brown is a 75 y.o. 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  He then had loosening of screws and had them removed by Dr. Louanne Skye due to continuous pain at that site.  There is no purulence but culture did yield a Staph epidermidis.  He was placed on oral Bactrim and then seen by my partner Dr. Gale Journey who placed him on IV daptomycin with stop date due in a few days.  He has now been admitted with extensive right-sided pneumonia.  He continues to complain of back pain that is not improved despite IV antibiotics.  #1 Pneumonia:   If he had a bacterial pneumonia he should have responded to his current antimicrobial therapy that: Includes vancomycin cefepime and metronidazole.  I think given his clinical worsening he merits Pulmonary CCM consult for consideration bronchoscopy with BAL.  While eosinophilic pneumonia due to daptomycin is rare and the unilateral nature of his lung disease would seem highly unusual this is worth considering as well as multidrug-resistant organisms or unusual pathogen such as nontuberculous mycobacteria or fungi.  Perhaps he also could have some mucus plugging that is causing his clinical worsening   #2  Hardware associated thoracic spine infection: MRI is reassuring inflammatory markers are up but also could be due to his pneumonia.  His organism is covered with vancomycin for now,   Had plan to switch to Bactrim but he is still on broad-spectrum antibiotics  #3 fluid collection identified on imaging:  This is been aspirated and does not appear to be infected.  Nothing is growing to date..   I spent greater than 35  minutes with the patient including greater than 50% of time in face to face counsel of the patient and in coordination of his care with the primary team (Dr. Sabino Gasser).   LOS: 5 days   Alcide Evener 11/16/2020, 11:54 AM

## 2020-11-16 NOTE — Progress Notes (Addendum)
Progress Note    Luis Brown   NWG:[id number]  DOB: 1945/09/12  DOA: 11/10/2020     5  PCP: Elise Benne  CC: SOB  Hospital Course: Luis Brown is a 75 yo male with PMH vertebral OM (was on 6 wk Dapto course PTA), peripheral neuropathy, HLD, HTN, DMII, DDD, BPH, anxiety/depression who presented to the hospital with SOB.  He initially presented to his PCPs office and was found to be hypoxic.  He was referred to the ER.  He underwent work-up with CTA chest which showed a large right sided PNA with ground glass nodules and concern for central cavitation.  He was started on IV antibiotics on admission.  Infectious disease was consulted given prolonged antibiotic course at home and now with atypical pneumonia.  Blood cultures were obtained on admission.  Repeat MRI T-spine and echo were also ordered. The echo was negative for valvular vegetation.  MRI was negative for deep infection but did reveal a subcutaneous probable seroma.  IR was consulted per ID for aspiration/drainage and culturing of fluid.  He underwent aspiration on 11/13/2020.  Fluid studies were sent for culture and did not grow (appearance was also non-infectious).    Interval History:  Wife present bedside this morning.  Still anxious at baseline but slightly better than yesterday.  He is still very dyspneic with any amount of exertion.  He had been seen by pulmonology already this morning and understands plan is for bronchoscopy tomorrow. He also stated that this morning he started feeling some swelling in his right arm and irritation around his PICC line site.  Discussed that we would obtain a duplex and have the PICC line pulled today regardless.  ROS: Constitutional: negative for chills and fevers, Respiratory: positive for cough, Cardiovascular: negative for chest pain and Gastrointestinal: negative for abdominal pain  Assessment & Plan: * CAP (community acquired pneumonia) - CAP with higher risk MDRO due to  prolonged IV abx (dapto) and recent exposure to healthcare - continue vanc, cefepime. - d/c azithro. Added flagyl 4/27 - ID consulted - CTA read on 4/26 suggests right sided cavitary lesions - follow up blood cultures (NGTD) - MRI T spine shows probable seroma; IR consulted per ID and plan is for aspiration/drainage on 11/13/20 (3cc drained, non infected appearing) - follow up fluid studies from aspiration: no organisms - continue flutter and IS - ambulate Qshift; PT ordered  - patient not improving; placed on O2 on 4/30 and now having worsening pleuritic CP, SOB, cough (anxiety also driving large component of this). Repeat CTA on 5/1 shows worsening right sided PNA, small effusion, no PE - formal pulmonology consult on 5/2; plan is for bronch on 5/3  DVT (deep venous thrombosis) ([place]) - Patient started complaining of some pain and swelling in his right arm on 11/16/2020 - Duplex obtained, acute DVT noted in right axillary and brachial veins - Pull PICC - Start on heparin per pharmacy; should be okay to transition to [place] in 1-2 days pending course - Heparin drip needs to be held 3 hours PRIOR to bronch on 11/17/20 (scheduled for 1030am)  Acute respiratory failure with hypoxia ([place]) - Has not been on oxygen at all during hospitalization but started requiring on 4/30.  - may be due to evolution of PNA vs development of effusion: CTA chest on 5/1 shows both (negative for PE however) - Continue oxygen.  Ultimately will need a walk test prior to discharge if needs home O2 -Continue incentive spirometer and  flutter  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HCC) - s/p removal of hardware of T5, T6 pedicle screws/rods, exploration of fusion on 09/14/20 - diagnosed with hardware associated Staph Epi OM. Seen by ID on 09/28/20 (prior to this had also been on a prolonged abx course for prior infection). Plan was: 6 weeks Dapto followed by Bactrim up to 3 months if tolerates - seen by ID this admission  again; continue on Vanc for now while in hospital  - repeat MRI T-spine 11/11/20: no epidural abscess. New fluid collection in SQ tissue T3-4 to T6-7 measuring 6 cm, considered a seroma (to be aspirated for culture on 4/29) -Will transition vancomycin to Bactrim once pneumonia further treated  Sepsis ([place]) - tachycardia, tachypnea, pneumonia source - see CAP  BPH (benign prostatic hyperplasia) - Continue Flomax  Hyperlipidemia - Continue Lipitor  Diabetes mellitus without complication ([place]) - continue SSI and CBG monitoring  - last A1c 6.3% on 09/08/20  HTN (hypertension) - Continue Avapro  Hyponatremia-resolved as of 11/11/2020 - Na 130 on admission; suspected hypovolemia - improved   Old records reviewed in assessment of this patient  Antimicrobials: Azithromycin 11/11/20 x 1 Vancomycin 11/10/2020 >> current Cefepime 11/10/2020 >> current  Flagyl 11/11/2020 >> current  DVT prophylaxis:    Code Status:   Code Status: Full Code Family Communication: wife  Disposition Plan: Status is: Inpatient  Remains inpatient appropriate because:Ongoing diagnostic testing needed not appropriate for outpatient work up, IV treatments appropriate due to intensity of illness or inability to take PO and Inpatient level of care appropriate due to severity of illness   Dispo: The patient is from: Home              Anticipated d/c is to: Home              Patient currently is not medically stable to d/c.   Difficult to place patient No  Risk of unplanned readmission score: Unplanned Admission- Pilot do not use: 19.47   Objective: Blood pressure 140/80, pulse 88, temperature 98.3 F (36.8 C), temperature source Oral, resp. rate (!) 22, height 5\' 10"  (1.778 m), weight 75.9 kg, SpO2 90 %.  Examination: General appearance: Still anxious but improved compared to yesterday.  No obvious distress.  Nasal cannula in place Head: Normocephalic, without obvious abnormality, atraumatic Eyes:  EOMI Lungs: Right-sided coarse breath sounds with rhonchi.  No wheezing.  Heart: regular rate and rhythm and S1, S2 normal Abdomen: normal findings: bowel sounds normal and soft, non-tender Extremities: no edema; RUE PICC in place with no surrounding signs of infection or erythema.  Right arm is now mildly swollen and some appreciable calor compared to left upper extremity Skin: mobility and turgor normal Neurologic: Grossly normal  Consultants:   ID  IR  Pulmonology  Procedures:   Thoracic fluid collection drainage with IR: 11/13/2020  Data Reviewed: I have personally reviewed following labs and imaging studies Results for orders placed or performed during the hospital encounter of 11/10/20 (from the past 24 hour(s))  Glucose, capillary     Status: Abnormal   Collection Time: 11/15/20  3:59 PM  Result Value Ref Range   Glucose-Capillary 252 (H) 70 - 99 mg/dL  Glucose, capillary     Status: Abnormal   Collection Time: 11/15/20  9:51 PM  Result Value Ref Range   Glucose-Capillary 147 (H) 70 - 99 mg/dL  Basic metabolic panel     Status: Abnormal   Collection Time: 11/16/20  4:52 AM  Result Value  Ref Range   Sodium 136 135 - 145 mmol/L   Potassium 4.1 3.5 - 5.1 mmol/L   Chloride 103 98 - 111 mmol/L   CO2 23 22 - 32 mmol/L   Glucose, Bld 179 (H) 70 - 99 mg/dL   BUN 14 8 - 23 mg/dL   Creatinine, Ser 0.57 (L) 0.61 - 1.24 mg/dL   Calcium 8.5 (L) 8.9 - 10.3 mg/dL   GFR, Estimated >60 >60 mL/min   Anion gap 10 5 - 15  CBC with Differential/Platelet     Status: Abnormal   Collection Time: 11/16/20  4:52 AM  Result Value Ref Range   WBC 11.2 (H) 4.0 - 10.5 K/uL   RBC 4.01 (L) 4.22 - 5.81 MIL/uL   Hemoglobin 12.0 (L) 13.0 - 17.0 g/dL   HCT 36.9 (L) 39.0 - 52.0 %   MCV 92.0 80.0 - 100.0 fL   MCH 29.9 26.0 - 34.0 pg   MCHC 32.5 30.0 - 36.0 g/dL   RDW 13.8 11.5 - 15.5 %   Platelets 214 150 - 400 K/uL   nRBC 0.0 0.0 - 0.2 %   Neutrophils Relative % 79 %   Neutro Abs 8.7 (H)  1.7 - 7.7 K/uL   Lymphocytes Relative 4 %   Lymphs Abs 0.5 (L) 0.7 - 4.0 K/uL   Monocytes Relative 11 %   Monocytes Absolute 1.2 (H) 0.1 - 1.0 K/uL   Eosinophils Relative 5 %   Eosinophils Absolute 0.6 (H) 0.0 - 0.5 K/uL   Basophils Relative 0 %   Basophils Absolute 0.1 0.0 - 0.1 K/uL   Immature Granulocytes 1 %   Abs Immature Granulocytes 0.12 (H) 0.00 - 0.07 K/uL  Magnesium     Status: None   Collection Time: 11/16/20  4:52 AM  Result Value Ref Range   Magnesium 1.8 1.7 - 2.4 mg/dL  Procalcitonin - Baseline     Status: None   Collection Time: 11/16/20  4:52 AM  Result Value Ref Range   Procalcitonin 0.11 ng/mL  Glucose, capillary     Status: Abnormal   Collection Time: 11/16/20  7:27 AM  Result Value Ref Range   Glucose-Capillary 145 (H) 70 - 99 mg/dL  Glucose, capillary     Status: Abnormal   Collection Time: 11/16/20 11:27 AM  Result Value Ref Range   Glucose-Capillary 236 (H) 70 - 99 mg/dL   *Note: Due to a large number of results and/or encounters for the requested time period, some results have not been displayed. A complete set of results can be found in Results Review.    Recent Results (from the past 240 hour(s))  Resp Panel by RT-PCR (Flu A&B, Covid) Nasopharyngeal Swab     Status: None   Collection Time: 11/10/20  4:54 PM   Specimen: Nasopharyngeal Swab; Nasopharyngeal(NP) swabs in vial transport medium  Result Value Ref Range Status   SARS Coronavirus 2 by RT PCR NEGATIVE NEGATIVE Final    Comment: (NOTE) SARS-CoV-2 target nucleic acids are NOT DETECTED.  The SARS-CoV-2 RNA is generally detectable in upper respiratory specimens during the acute phase of infection. The lowest concentration of SARS-CoV-2 viral copies this assay can detect is 138 copies/mL. A negative result does not preclude SARS-Cov-2 infection and should not be used as the sole basis for treatment or other patient management decisions. A negative result may occur with  improper specimen  collection/handling, submission of specimen other than nasopharyngeal swab, presence of viral mutation(s) within the areas targeted by this  assay, and inadequate number of viral copies(<138 copies/mL). A negative result must be combined with clinical observations, patient history, and epidemiological information. The expected result is Negative.  Fact Sheet for Patients:  EntrepreneurPulse.com.au  Fact Sheet for Healthcare Providers:  IncredibleEmployment.be  This test is no t yet approved or cleared by the [place] FDA and  has been authorized for detection and/or diagnosis of SARS-CoV-2 by FDA under an Emergency Use Authorization (EUA). This EUA will remain  in effect (meaning this test can be used) for the duration of the COVID-19 declaration under Section 564(b)(1) of the Act, 21 U.S.C.section 360bbb-3(b)(1), unless the authorization is terminated  or revoked sooner.       Influenza A by PCR NEGATIVE NEGATIVE Final   Influenza B by PCR NEGATIVE NEGATIVE Final    Comment: (NOTE) The Xpert Xpress SARS-CoV-2/FLU/RSV plus assay is intended as an aid in the diagnosis of influenza from Nasopharyngeal swab specimens and should not be used as a sole basis for treatment. Nasal washings and aspirates are unacceptable for Xpert Xpress SARS-CoV-2/FLU/RSV testing.  Fact Sheet for Patients: EntrepreneurPulse.com.au  Fact Sheet for Healthcare Providers: IncredibleEmployment.be  This test is not yet approved or cleared by the [place] FDA and has been authorized for detection and/or diagnosis of SARS-CoV-2 by FDA under an Emergency Use Authorization (EUA). This EUA will remain in effect (meaning this test can be used) for the duration of the COVID-19 declaration under Section 564(b)(1) of the Act, 21 U.S.C. section 360bbb-3(b)(1), unless the authorization is terminated or revoked.  Performed at [facility], [address] [place], [place] [address]   Culture, blood (routine x 2)     Status: None   Collection Time: 11/10/20  6:04 PM   Specimen: BLOOD  Result Value Ref Range Status   Specimen Description   Final    BLOOD PICC LINE Performed at [facility], [address] [place], [place] [address]    Special Requests   Final    BOTTLES DRAWN AEROBIC AND ANAEROBIC Blood Culture adequate volume Performed at [facility], [address] [place], [place] [address]    Culture   Final    NO GROWTH 5 DAYS Performed at [facility], [place] [address], [place], [place] [address]    Report Status 11/16/2020 FINAL  Final  Culture, blood (routine x 2)     Status: None   Collection Time: 11/10/20  6:05 PM   Specimen: BLOOD LEFT ARM  Result Value Ref Range Status   Specimen Description   Final    BLOOD LEFT ARM Performed at [facility], [address] [place], [place] [address]    Special Requests   Final    BOTTLES DRAWN AEROBIC AND ANAEROBIC Blood Culture adequate volume Performed at [facility], [address], [place], [place] [address]    Culture   Final    NO GROWTH 5 DAYS Performed at [facility], [place] [address] [place], [place] [address]    Report Status 11/16/2020 FINAL  Final  Respiratory (~20 pathogens) panel by PCR     Status: None   Collection Time: 11/12/20  1:51 PM  Result Value Ref Range Status   Adenovirus NOT DETECTED NOT DETECTED Final   Coronavirus 229E NOT DETECTED NOT DETECTED Final    Comment: (NOTE) The Coronavirus on the Respiratory Panel, DOES NOT test for the novel  Coronavirus (  2019 nCoV)    Coronavirus HKU1 NOT DETECTED NOT DETECTED Final   Coronavirus NL63 NOT DETECTED NOT DETECTED Final   Coronavirus OC43 NOT DETECTED NOT DETECTED Final   Metapneumovirus NOT DETECTED NOT DETECTED Final   Rhinovirus / Enterovirus NOT DETECTED NOT DETECTED Final   Influenza A NOT DETECTED  NOT DETECTED Final   Influenza B NOT DETECTED NOT DETECTED Final   Parainfluenza Virus 1 NOT DETECTED NOT DETECTED Final   Parainfluenza Virus 2 NOT DETECTED NOT DETECTED Final   Parainfluenza Virus 3 NOT DETECTED NOT DETECTED Final   Parainfluenza Virus 4 NOT DETECTED NOT DETECTED Final   Respiratory Syncytial Virus NOT DETECTED NOT DETECTED Final   Bordetella pertussis NOT DETECTED NOT DETECTED Final   Bordetella Parapertussis NOT DETECTED NOT DETECTED Final   Chlamydophila pneumoniae NOT DETECTED NOT DETECTED Final   Mycoplasma pneumoniae NOT DETECTED NOT DETECTED Final    Comment: Performed at [facility], [place] [address], [place], [place] [address]  Aerobic/Anaerobic Culture (surgical/deep wound)     Status: None (Preliminary result)   Collection Time: 11/13/20  8:47 AM   Specimen: Thoracic; Wound  Result Value Ref Range Status   Specimen Description   Final    THORACIC T5 T6 Performed at [facility] [address] [place], [place] [address]    Special Requests   Final    Normal Performed at [facility], [place] [address] [place], [place] [address]    Gram Stain   Final    FEW WBC PRESENT,BOTH PMN AND MONONUCLEAR NO ORGANISMS SEEN    Culture   Final    NO GROWTH 3 DAYS NO ANAEROBES ISOLATED; CULTURE IN PROGRESS FOR 5 DAYS Performed at [facility], [place] [address] [place], [place] [address]    Report Status PENDING  Incomplete     Radiology Studies: CT ANGIO CHEST PE W OR WO CONTRAST  Result Date: 11/15/2020 CLINICAL DATA:  Chest pain and shortness of breath. EXAM: CT ANGIOGRAPHY CHEST WITH CONTRAST TECHNIQUE: Multidetector CT imaging of the chest was performed using the standard protocol during bolus administration of intravenous contrast. Multiplanar CT image reconstructions and MIPs were obtained to evaluate the vascular anatomy. CONTRAST:  131mL OMNIPAQUE IOHEXOL 350 MG/ML SOLN COMPARISON:  11/10/2020 FINDINGS:  Cardiovascular: Heart size upper normal to mildly increased. Coronary artery calcification is evident. Atherosclerotic calcification is noted in the wall of the thoracic aorta. There is no filling defect within the opacified pulmonary arteries to suggest the presence of an acute pulmonary embolus. Mediastinum/Nodes: Mediastinal lymphadenopathy again noted. 13 mm precarinal lymph node evident. 13 mm short axis subcarinal lymph node. Mild right hilar lymphadenopathy. The esophagus has normal imaging features. There is no axillary lymphadenopathy. Lungs/Pleura: Interval progression of diffuse airspace disease in the right lung with ground-glass and consolidative components involving all 3 lobes. Small right pleural effusion associated. There is some minimal airspace disease in the anterior left upper lobe, similar to prior. Upper Abdomen: Unremarkable. Musculoskeletal: Extensive thoracolumbar fusion, incompletely visualized with evidence of prior hardware retrieval in the thoracic spine. Review of the MIP images confirms the above findings. IMPRESSION: 1. No CT evidence for acute pulmonary embolus. 2. Interval progression of diffuse airspace disease in the right lung with ground-glass and consolidative components involving all 3 lobes. Imaging features compatible with multifocal pneumonia. 3. Small right pleural effusion. 4. Mediastinal and right hilar lymphadenopathy, likely reactive. 5. Aortic Atherosclerosis (ICD10-I70.0). Electronically Signed   By: Verda Cumins.D.  On: 11/15/2020 09:17   VAS [place] UPPER EXTREMITY VENOUS DUPLEX  Result Date: 11/16/2020 UPPER VENOUS STUDY  Patient Name:  Luis Brown  Date of Exam:   11/16/2020 Medical Rec #: [id number]         Accession #:    [id number] Date of Birth: May 15, 1946          Patient Gender: M Patient Age:   75Y Exam [facility]  [facility] Procedure:      VAS [place] UPPER EXTREMITY VENOUS DUPLEX Referring Phys: Louisa  --------------------------------------------------------------------------------  Indications: Swelling Risk Factors: Trauma PICC line. Limitations: Bandages and line. Comparison Study: No prior studies. Performing Technologist: Oliver Hum RVT  Examination Guidelines: A complete evaluation includes B-mode imaging, spectral Doppler, color Doppler, and power Doppler as needed of all accessible portions of each vessel. Bilateral testing is considered an integral part of a complete examination. Limited examinations for reoccurring indications may be performed as noted.  Right Findings: +----------+------------+---------+-----------+----------+-------+ RIGHT     CompressiblePhasicitySpontaneousPropertiesSummary +----------+------------+---------+-----------+----------+-------+ IJV           Full       Yes       Yes                      +----------+------------+---------+-----------+----------+-------+ Subclavian    Full       Yes       Yes                      +----------+------------+---------+-----------+----------+-------+ Axillary      None       No        No                Acute  +----------+------------+---------+-----------+----------+-------+ Brachial    Partial      No        No                Acute  +----------+------------+---------+-----------+----------+-------+ Radial        Full                                          +----------+------------+---------+-----------+----------+-------+ Ulnar         Full                                          +----------+------------+---------+-----------+----------+-------+ Cephalic      Full                                          +----------+------------+---------+-----------+----------+-------+ Basilic       Full                                          +----------+------------+---------+-----------+----------+-------+  Left Findings: +----------+------------+---------+-----------+----------+-------+  LEFT      CompressiblePhasicitySpontaneousPropertiesSummary +----------+------------+---------+-----------+----------+-------+ Subclavian    Full       Yes       Yes                      +----------+------------+---------+-----------+----------+-------+  Summary:  Right: No evidence of superficial vein thrombosis in the upper extremity. Findings consistent with acute deep vein thrombosis involving the right axillary vein and right brachial veins.  Left: No evidence of thrombosis in the subclavian.  *See table(s) above for measurements and observations.    Preliminary    VAS [place] UPPER EXTREMITY VENOUS DUPLEX      CT ANGIO CHEST PE W OR WO CONTRAST  Final Result    IR US Guide Bx Asp/Drain  Final Result    MR THORACIC SPINE W WO CONTRAST  Final Result    CT Angio Chest PE W/Cm &/Or Wo Cm  Final Result    DG Chest Port 1 View  Final Result      Scheduled Meds: . atorvastatin  10 mg Oral Daily  . busPIRone  7.5 mg Oral BID  . Chlorhexidine Gluconate Cloth  6 each Topical Daily  . dextromethorphan-guaiFENesin  1 tablet Oral BID  . famotidine  20 mg Oral Daily  . Ferrous Fumarate  1 tablet Oral Daily  . insulin aspart  0-9 Units Subcutaneous TID WC  . ipratropium-albuterol  3 mL Nebulization TID  . irbesartan  37.5 mg Oral Daily  . metroNIDAZOLE  500 mg Oral Q8H  . pantoprazole  40 mg Oral BID AC  . pregabalin  75 mg Oral BID  . sucralfate  1 g Oral TID WC & HS  . tamsulosin  0.8 mg Oral Daily  . testosterone cypionate  200 mg Intramuscular Once  . venlafaxine XR  150 mg Oral Q breakfast   PRN Meds: acetaminophen, ALPRAZolam, HYDROcodone-acetaminophen, ipratropium-albuterol, morphine injection, sodium chloride, sodium chloride flush, tiZANidine, zolpidem Continuous Infusions: . ceFEPime (MAXIPIME) IV 2 g (11/16/20 0333)  . heparin    . vancomycin 1,000 mg (11/16/20 1034)     LOS: 5 days  Time spent: Greater than 50% of the 35 minute visit was spent in  counseling/coordination of care for the patient as laid out in the A&P.   Dwyane Dee, MD [facility] 11/16/2020, 1:15 PM

## 2020-11-16 NOTE — Progress Notes (Signed)
PT Cancellation Note  Patient Details Name: Luis Brown MRN: [id number] DOB: 05-29-1946   Cancelled Treatment:    Reason Eval/Treat Not Completed: Medical issues which prohibited therapy Pt reports MD in earlier and report pt is to have bronchoscopy tomorrow.  Pt reports he was also told to remain in bed.  Will check back after procedure.   Almeta Geisel,KATHrine E 11/16/2020, 11:16 AM Arlyce Dice, DPT Acute Rehabilitation Services Pager: [phone] Office: [phone]

## 2020-11-16 NOTE — Progress Notes (Signed)
ANTICOAGULATION CONSULT NOTE   Pharmacy Consult for Heparin Indication: DVT  No Known Allergies  Patient Measurements: Height: 5\' 10"  (177.8 cm) Weight: 75.9 kg (167 lb 5.3 oz) IBW/kg (Calculated) : 73 Heparin Dosing Weight: 76 kg  Vital Signs: Temp: 97.5 F (36.4 C) (05/02 2149) Temp Source: Oral (05/02 2149) BP: 103/63 (05/02 2149) Pulse Rate: 76 (05/02 2149)  Labs: Recent Labs    11/14/20 0421 11/15/20 0313 11/16/20 0452 11/16/20 2156  HGB 10.6* 11.2* 12.0*  --   HCT 32.7* 34.2* 36.9*  --   PLT 270 247 214  --   HEPARINUNFRC  --   --   --  <0.10*  CREATININE 0.65 0.59* 0.57*  --     Estimated Creatinine Clearance: 79.8 mL/min (A) (by C-G formula based on SCr of 0.57 mg/dL (L)).   Medical History: Past Medical History:  Diagnosis Date  . Acute pharyngitis 10/21/2013  . Allergy    grass and pollen  . Anxiety and depression 10/25/2011  . BPH (benign prostatic hyperplasia) 04/23/2012  . Chicken pox as a child  . DDD (degenerative disc disease)    cervical responds to steroid injections and low back required surgery  . DDD (degenerative disc disease), lumbosacral   . Diabetes mellitus    pre  . Dyspnea   . ED (erectile dysfunction) 04/23/2012  . Elevated BP   . 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 Esophageal reflux 02/10/2015  . Fatigue   . HTN (hypertension)   . Hyperglycemia    preDM   . Hyperlipidemia   . Insomnia   . Low back pain radiating to both legs 01/16/2017  . Measles as a child  . Medicare annual wellness visit, subsequent 02/10/2015  . Overweight(278.02)   . Peripheral neuropathy 08/07/2019  . Personal history of colonic polyps 10/27/2012   Follows with [facility]  . Pneumonia    " walking"  . Preventative health care   . 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  . Wears glasses    Medications:  Scheduled:  . atorvastatin  10 mg Oral Daily  . busPIRone  7.5 mg Oral BID  . Chlorhexidine Gluconate Cloth  6 each Topical Daily  .  dextromethorphan-guaiFENesin  1 tablet Oral BID  . famotidine  20 mg Oral Daily  . Ferrous Fumarate  1 tablet Oral Daily  . insulin aspart  0-9 Units Subcutaneous TID WC  . ipratropium-albuterol  3 mL Nebulization TID  . irbesartan  37.5 mg Oral Daily  . metroNIDAZOLE  500 mg Oral Q8H  . pantoprazole  40 mg Oral BID AC  . pregabalin  75 mg Oral BID  . sucralfate  1 g Oral TID WC & HS  . tamsulosin  0.8 mg Oral Daily  . venlafaxine XR  150 mg Oral Q breakfast   Infusions:  . ceFEPime (MAXIPIME) IV 2 g (11/16/20 2211)  . heparin 1,300 Units/hr (11/16/20 1353)  . vancomycin 1,000 mg (11/16/20 1034)    Assessment: 8 yoM with vertebral osteo, R PICC for IV abx PTA, now with RUE DVT.  Was on Lovenox 40mg  SQ qd for VTE prophylaxis, last dose today 5/2 at 10am. PICC line removed, single L hand IV site, will need another IV site for Abx.  HL < 0.1 subtherapeutic on 1300 units/hr No bleeding or interruptions per RN  Goal of Therapy:  Heparin level 0.3-0.7 units/ml Monitor platelets by anticoagulation protocol: Yes   Plan:  Increase heparin to 1550 units/hr Daily CBC, order daily Hep level at steady  state Stop Heparin infusion on 5/3 at 0730 am, hold Heparin 3 hr prior to Bronch at 1030am  [place] 11/16/2020, 10:56 PM

## 2020-11-17 ENCOUNTER — Inpatient Hospital Stay (HOSPITAL_COMMUNITY): Payer: Medicare Other

## 2020-11-17 ENCOUNTER — Encounter (HOSPITAL_COMMUNITY): Payer: Self-pay | Admitting: Internal Medicine

## 2020-11-17 ENCOUNTER — Encounter (HOSPITAL_COMMUNITY)
Admission: EM | Disposition: A | Discharge status: Home Health | Payer: Self-pay | Source: Ambulatory Visit | Attending: Internal Medicine

## 2020-11-17 ENCOUNTER — Inpatient Hospital Stay (HOSPITAL_COMMUNITY): Payer: Medicare Other | Admitting: Registered Nurse

## 2020-11-17 DIAGNOSIS — Z9889 Other specified postprocedural states: Secondary | ICD-10-CM

## 2020-11-17 DIAGNOSIS — I82A11 Acute embolism and thrombosis of right axillary vein: Secondary | ICD-10-CM | POA: Diagnosis not present

## 2020-11-17 DIAGNOSIS — J189 Pneumonia, unspecified organism: Secondary | ICD-10-CM | POA: Diagnosis not present

## 2020-11-17 DIAGNOSIS — E119 Type 2 diabetes mellitus without complications: Secondary | ICD-10-CM | POA: Diagnosis not present

## 2020-11-17 DIAGNOSIS — T888XXD Other specified complications of surgical and medical care, not elsewhere classified, subsequent encounter: Secondary | ICD-10-CM

## 2020-11-17 DIAGNOSIS — J9601 Acute respiratory failure with hypoxia: Secondary | ICD-10-CM | POA: Diagnosis not present

## 2020-11-17 HISTORY — PX: BRONCHIAL BIOPSY: SHX5109

## 2020-11-17 HISTORY — PX: BRONCHIAL WASHINGS: SHX5105

## 2020-11-17 HISTORY — PX: VIDEO BRONCHOSCOPY: SHX5072

## 2020-11-17 HISTORY — PX: HEMOSTASIS CONTROL: SHX6838

## 2020-11-17 HISTORY — PX: BRONCHIAL BRUSHINGS: SHX5108

## 2020-11-17 LAB — CBC WITH DIFFERENTIAL/PLATELET
Abs Immature Granulocytes: 0.2 10*3/uL — ABNORMAL HIGH (ref 0.00–0.07)
Basophils Absolute: 0.1 10*3/uL (ref 0.0–0.1)
Basophils Relative: 1 %
Eosinophils Absolute: 0.6 10*3/uL — ABNORMAL HIGH (ref 0.0–0.5)
Eosinophils Relative: 5 %
HCT: 36.3 % — ABNORMAL LOW (ref 39.0–52.0)
Hemoglobin: 11.7 g/dL — ABNORMAL LOW (ref 13.0–17.0)
Immature Granulocytes: 2 %
Lymphocytes Relative: 7 %
Lymphs Abs: 0.8 10*3/uL (ref 0.7–4.0)
MCH: 30.2 pg (ref 26.0–34.0)
MCHC: 32.2 g/dL (ref 30.0–36.0)
MCV: 93.8 fL (ref 80.0–100.0)
Monocytes Absolute: 1.4 10*3/uL — ABNORMAL HIGH (ref 0.1–1.0)
Monocytes Relative: 11 %
Neutro Abs: 9.1 10*3/uL — ABNORMAL HIGH (ref 1.7–7.7)
Neutrophils Relative %: 74 %
Platelets: 276 10*3/uL (ref 150–400)
RBC: 3.87 MIL/uL — ABNORMAL LOW (ref 4.22–5.81)
RDW: 14 % (ref 11.5–15.5)
WBC: 12.2 10*3/uL — ABNORMAL HIGH (ref 4.0–10.5)
nRBC: 0 % (ref 0.0–0.2)

## 2020-11-17 LAB — MAGNESIUM: Magnesium: 1.9 mg/dL (ref 1.7–2.4)

## 2020-11-17 LAB — BODY FLUID CELL COUNT WITH DIFFERENTIAL: Total Nucleated Cell Count, Fluid: 72 cu mm (ref 0–1000)

## 2020-11-17 LAB — BASIC METABOLIC PANEL
Anion gap: 9 (ref 5–15)
BUN: 13 mg/dL (ref 8–23)
CO2: 25 mmol/L (ref 22–32)
Calcium: 8.1 mg/dL — ABNORMAL LOW (ref 8.9–10.3)
Chloride: 101 mmol/L (ref 98–111)
Creatinine, Ser: 0.6 mg/dL — ABNORMAL LOW (ref 0.61–1.24)
GFR, Estimated: >60 mL/min (ref >60)
Glucose, Bld: 120 mg/dL — ABNORMAL HIGH (ref 70–99)
Potassium: 3.4 mmol/L — ABNORMAL LOW (ref 3.5–5.1)
Sodium: 135 mmol/L (ref 135–145)

## 2020-11-17 LAB — GLUCOSE, CAPILLARY
Glucose-Capillary: 142 mg/dL — ABNORMAL HIGH (ref 70–99)
Glucose-Capillary: 211 mg/dL — ABNORMAL HIGH (ref 70–99)
Glucose-Capillary: 355 mg/dL — ABNORMAL HIGH (ref 70–99)

## 2020-11-17 LAB — PROCALCITONIN: Procalcitonin: <0.1 ng/mL

## 2020-11-17 SURGERY — BRONCHOSCOPY, WITH FLUOROSCOPY
Anesthesia: General

## 2020-11-17 MED ORDER — SUCCINYLCHOLINE CHLORIDE 200 MG/10ML IV SOSY
PREFILLED_SYRINGE | INTRAVENOUS | Status: DC | PRN
  Administered 2020-11-17: 140 mg via INTRAVENOUS

## 2020-11-17 MED ORDER — LIDOCAINE 2% (20 MG/ML) 5 ML SYRINGE
INTRAMUSCULAR | Status: DC | PRN
  Administered 2020-11-17: 40 mg via INTRAVENOUS

## 2020-11-17 MED ORDER — SUGAMMADEX SODIUM 500 MG/5ML IV SOLN
INTRAVENOUS | Status: DC | PRN
  Administered 2020-11-17: 300 mg via INTRAVENOUS

## 2020-11-17 MED ORDER — POTASSIUM CHLORIDE 10 MEQ/100ML IV SOLN
10.0000 meq | INTRAVENOUS | Status: AC
  Administered 2020-11-17: 10 meq via INTRAVENOUS
  Filled 2020-11-17 (×2): qty 100

## 2020-11-17 MED ORDER — PROPOFOL 10 MG/ML IV BOLUS
INTRAVENOUS | Status: AC
  Filled 2020-11-17: qty 20

## 2020-11-17 MED ORDER — PROPOFOL 10 MG/ML IV BOLUS
INTRAVENOUS | Status: DC | PRN
  Administered 2020-11-17: 140 mg via INTRAVENOUS

## 2020-11-17 MED ORDER — LABETALOL HCL 5 MG/ML IV SOLN: 10.0000 mg | INTRAVENOUS | Status: DC | PRN

## 2020-11-17 MED ORDER — INSULIN ASPART 100 UNIT/ML ~~LOC~~ SOLN: 0.0000 [IU] | Freq: Three times a day (TID) | SUBCUTANEOUS | Status: DC

## 2020-11-17 MED ORDER — LACTATED RINGERS IV SOLN: INTRAVENOUS | Status: DC

## 2020-11-17 MED ORDER — HYDRALAZINE HCL 25 MG PO TABS: 25.0000 mg | ORAL_TABLET | ORAL | Status: DC | PRN

## 2020-11-17 MED ORDER — HEPARIN (PORCINE) 25000 UT/250ML-% IV SOLN
1600.0000 [IU]/h | INTRAVENOUS | Status: AC
  Administered 2020-11-17: 1550 [IU]/h via INTRAVENOUS
  Administered 2020-11-18 – 2020-11-19 (×2): 1650 [IU]/h via INTRAVENOUS
  Filled 2020-11-17 (×3): qty 250

## 2020-11-17 MED ORDER — EPINEPHRINE PF 1 MG/ML IJ SOLN
INTRAMUSCULAR | Status: DC | PRN
  Administered 2020-11-17 (×4): 2 mg via ENDOTRACHEOPULMONARY

## 2020-11-17 MED ORDER — INSULIN ASPART 100 UNIT/ML ~~LOC~~ SOLN
0.0000 [IU] | Freq: Three times a day (TID) | SUBCUTANEOUS | Status: DC
  Administered 2020-11-17: 9 [IU] via SUBCUTANEOUS
  Administered 2020-11-18: 2 [IU] via SUBCUTANEOUS
  Administered 2020-11-18: 3 [IU] via SUBCUTANEOUS

## 2020-11-17 MED ORDER — FENTANYL CITRATE (PF) 100 MCG/2ML IJ SOLN
INTRAMUSCULAR | Status: AC
  Filled 2020-11-17: qty 2

## 2020-11-17 MED ORDER — DEXAMETHASONE SODIUM PHOSPHATE 10 MG/ML IJ SOLN
INTRAMUSCULAR | Status: DC | PRN
  Administered 2020-11-17: 8 mg via INTRAVENOUS

## 2020-11-17 MED ORDER — ONDANSETRON HCL 4 MG/2ML IJ SOLN
INTRAMUSCULAR | Status: DC | PRN
  Administered 2020-11-17: 4 mg via INTRAVENOUS

## 2020-11-17 MED ORDER — FENTANYL CITRATE (PF) 100 MCG/2ML IJ SOLN
INTRAMUSCULAR | Status: DC | PRN
  Administered 2020-11-17: 50 ug via INTRAVENOUS
  Administered 2020-11-17 (×2): 25 ug via INTRAVENOUS

## 2020-11-17 MED ORDER — ROCURONIUM BROMIDE 10 MG/ML (PF) SYRINGE
PREFILLED_SYRINGE | INTRAVENOUS | Status: DC | PRN
  Administered 2020-11-17: 30 mg via INTRAVENOUS

## 2020-11-17 NOTE — Progress Notes (Signed)
Progress Note    Luis Brown   TLX:[id number]  DOB: [birth year]  DOA: 11/10/2020     6  PCP: Luis Brown  CC: SOB  Hospital Course: Luis Brown is a 75 yo male with PMH vertebral OM (was on 6 wk Dapto course PTA), peripheral neuropathy, HLD, HTN, DMII, DDD, BPH, anxiety/depression who presented to the hospital with SOB.  He initially presented to his PCPs office and was found to be hypoxic.  He was referred to the ER.  He underwent work-up with CTA chest which showed a large right sided PNA with ground glass nodules and concern for central cavitation.  He was started on IV antibiotics on admission.  Infectious disease was consulted given prolonged antibiotic course at home and now with atypical pneumonia.  Blood cultures were obtained on admission.  Repeat MRI T-spine and echo were also ordered. The echo was negative for valvular vegetation.  MRI was negative for deep infection but did reveal a subcutaneous probable seroma.  IR was consulted per ID for aspiration/drainage and culturing of fluid.  He underwent aspiration on 11/13/2020.  Fluid studies were sent for culture and did not grow (appearance was also non-infectious).  He had some swelling and irritation associated with his PICC line.  He underwent right upper extremity duplex which was positive for DVT.  He was started on heparin drip on 11/16/2020 and PICC line was removed. Due to worsening respiratory symptoms, increasing oxygen requirements, he underwent repeat CTA chest.  This showed worsening right-sided pneumonia.  Pulmonology was consulted and he also underwent bronchoscopy on 11/17/2020.   Interval History:  Underwent bronchoscopy today.  Seen in his room after procedure.  Tolerated fairly well.  Samples were obtained and sent for further testing.  Patient more anxious as expected due to unknown findings and worrying about worsening of pneumonia.  Provided empathetic listening and tried providing some reassurance.  I  re-explained that he does have a severe right-sided pneumonia; he is on O2 but not drastically worsening daily and the hope is that he doesn't. Certainly the reality exists of worsening, but he must continue to do his part to improve just as much as the medical team is.  He understands the need to get OOB with PT and also to the chair when able, using the IS, and trying to not let his anxiety work himself up to feeling SOB/dyspneic.   ROS: Constitutional: negative for chills and fevers, Respiratory: positive for cough, Cardiovascular: negative for chest pain and Gastrointestinal: negative for abdominal pain  Assessment & Plan: * CAP (community acquired pneumonia) - CAP with higher risk MDRO due to prolonged IV abx (dapto) and recent exposure to healthcare - continue vanc, cefepime. - d/c azithro. Added flagyl 4/27 - ID consulted - CTA read on 4/26 suggests right sided cavitary lesions - follow up blood cultures (NGTD) - MRI T spine shows probable seroma; IR consulted per ID and plan is for aspiration/drainage on 11/13/20 (3cc drained, non infected appearing) - follow up fluid studies from aspiration: no organisms - continue flutter and IS - ambulate Qshift; PT ordered  - patient not improving; placed on O2 on 4/30 and now having worsening pleuritic CP, SOB, cough (anxiety also driving large component of this). Repeat CTA on 5/1 shows worsening right sided PNA, small effusion, no PE - pulmonology consulted 5/2. Patient underwent bronch 5/3. Follow up bronch studies  DVT (deep venous thrombosis) (Luis Brown) - Patient started complaining of some pain and swelling in his right  arm on 11/16/2020 - Duplex obtained 5/2, acute DVT noted in right axillary and brachial veins - PICC removed 5/2 - Start on heparin per pharmacy; should be okay to transition to Luis Brown in 1-2 days pending course - heparin to resume after bronch on 5/3 at 4pm  Acute respiratory failure with hypoxia (Luis Brown) - Has not been on oxygen  at all during hospitalization but started requiring on 4/30.  - may be due to evolution of PNA vs development of effusion: CTA chest on 5/1 shows both (negative for PE however) - Continue oxygen.  -Continue incentive spirometer and flutter - was on 4L prior to bronch; on 6L after bronch, anticipate should be able to wean back some after BAL fluids reabsorbs slowly. - patient knows to ambulate, use IS, and work on anxiety control  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Luis Brown) - s/p removal of hardware of T5, T6 pedicle screws/rods, exploration of fusion on 09/14/20 - diagnosed with hardware associated Staph Epi OM. Seen by ID on 09/28/20 (prior to this had also been on a prolonged abx course for prior infection). Plan was: 6 weeks Dapto followed by Bactrim up to 3 months if tolerates - seen by ID this admission again; continue on Vanc for now while in hospital  - repeat MRI T-spine 11/11/20: no epidural abscess. New fluid collection in SQ tissue T3-4 to T6-7 measuring 6 cm, considered a seroma (to be aspirated for culture on 4/29) -Will transition vancomycin to Bactrim once pneumonia further treated  Sepsis (Luis Brown) - tachycardia, tachypnea, pneumonia source - see CAP  BPH (benign prostatic hyperplasia) - Continue Flomax  Hyperlipidemia - Continue Lipitor  Diabetes mellitus without complication (Luis Brown) - continue SSI and CBG monitoring  - last A1c 6.3% on 09/08/20  HTN (hypertension) - Continue Avapro  Hyponatremia-resolved as of 11/11/2020 - Na 130 on admission; suspected hypovolemia - improved   Old records reviewed in assessment of this patient  Antimicrobials: Azithromycin 11/11/20 x 1 Vancomycin 11/10/2020 >> current Cefepime 11/10/2020 >> current  Flagyl 11/11/2020 >> current  DVT prophylaxis:    Code Status:   Code Status: Full Code Family Communication: wife  Disposition Plan: Status is: Inpatient  Remains inpatient appropriate because:Ongoing diagnostic testing needed  not appropriate for outpatient work up, IV treatments appropriate due to intensity of illness or inability to take PO and Inpatient level of care appropriate due to severity of illness   Dispo: The patient is from: Home              Anticipated d/c is to: Home              Patient currently is not medically stable to d/c.   Difficult to place patient No  Risk of unplanned readmission score: Unplanned Admission- Pilot do not use: 18.52   Objective: Blood pressure (!) 174/90, pulse 88, temperature (!) 97.5 F (36.4 C), temperature source Oral, resp. rate 17, height _0  (1.778 m), weight 75.9 kg, SpO2 94 %.  Examination: General appearance: Pleasant but anxious adult man resting in bed appearing comfortable and in no distress.  Does appear more fatigued and is on 6 L oxygen Head: Normocephalic, without obvious abnormality, atraumatic Eyes: EOMI Lungs: Right-sided coarse breath sounds with rhonchi.  No wheezing.  Heart: regular rate and rhythm and S1, S2 normal Abdomen: normal findings: bowel sounds normal and soft, non-tender Extremities: PICC line now removed from right upper extremity Skin: mobility and turgor normal Neurologic: Grossly normal  Consultants:   ID  IR  Pulmonology  Procedures:   Thoracic fluid collection drainage with IR: 11/13/2020  Data Reviewed: I have personally reviewed following labs and imaging studies Results for orders placed or performed during the hospital encounter of 11/10/20 (from the past 24 hour(s))  Glucose, capillary     Status: Abnormal   Collection Time: 11/16/20  4:30 PM  Result Value Ref Range   Glucose-Capillary 158 (H) 70 - 99 mg/dL  Glucose, capillary     Status: Abnormal   Collection Time: 11/16/20  9:45 PM  Result Value Ref Range   Glucose-Capillary 311 (H) 70 - 99 mg/dL  Heparin level (unfractionated)     Status: Abnormal   Collection Time: 11/16/20  9:56 PM  Result Value Ref Range   Heparin Unfractionated <0.10 (L) 0.30 -  0.70 IU/mL  Procalcitonin     Status: None   Collection Time: 11/17/20  4:42 AM  Result Value Ref Range   Procalcitonin <0.10 ng/mL  Basic metabolic panel     Status: Abnormal   Collection Time: 11/17/20  4:42 AM  Result Value Ref Range   Sodium 135 135 - 145 mmol/L   Potassium 3.4 (L) 3.5 - 5.1 mmol/L   Chloride 101 98 - 111 mmol/L   CO2 25 22 - 32 mmol/L   Glucose, Bld 120 (H) 70 - 99 mg/dL   BUN 13 8 - 23 mg/dL   Creatinine, Ser 0.60 (L) 0.61 - 1.24 mg/dL   Calcium 8.1 (L) 8.9 - 10.3 mg/dL   GFR, Estimated >60 >60 mL/min   Anion gap 9 5 - 15  CBC with Differential/Platelet     Status: Abnormal   Collection Time: 11/17/20  4:42 AM  Result Value Ref Range   WBC 12.2 (H) 4.0 - 10.5 K/uL   RBC 3.87 (L) 4.22 - 5.81 MIL/uL   Hemoglobin 11.7 (L) 13.0 - 17.0 g/dL   HCT 36.3 (L) 39.0 - 52.0 %   MCV 93.8 80.0 - 100.0 fL   MCH 30.2 26.0 - 34.0 pg   MCHC 32.2 30.0 - 36.0 g/dL   RDW 14.0 11.5 - 15.5 %   Platelets 276 150 - 400 K/uL   nRBC 0.0 0.0 - 0.2 %   Neutrophils Relative % 74 %   Neutro Abs 9.1 (H) 1.7 - 7.7 K/uL   Lymphocytes Relative 7 %   Lymphs Abs 0.8 0.7 - 4.0 K/uL   Monocytes Relative 11 %   Monocytes Absolute 1.4 (H) 0.1 - 1.0 K/uL   Eosinophils Relative 5 %   Eosinophils Absolute 0.6 (H) 0.0 - 0.5 K/uL   Basophils Relative 1 %   Basophils Absolute 0.1 0.0 - 0.1 K/uL   Immature Granulocytes 2 %   Abs Immature Granulocytes 0.20 (H) 0.00 - 0.07 K/uL  Magnesium     Status: None   Collection Time: 11/17/20  4:42 AM  Result Value Ref Range   Magnesium 1.9 1.7 - 2.4 mg/dL  Glucose, capillary     Status: Abnormal   Collection Time: 11/17/20  7:22 AM  Result Value Ref Range   Glucose-Capillary 142 (H) 70 - 99 mg/dL  Body fluid cell count with differential     Status: Abnormal   Collection Time: 11/17/20 11:48 AM  Result Value Ref Range   Fluid Type-FCT BRONCHIAL ALVEOLAR LAVAGE    Color, Fluid COLORLESS (A) YELLOW   Appearance, Fluid HAZY (A) CLEAR   Total  Nucleated Cell Count, Fluid 72 0 - 1,000 cu mm   Neutrophil Count, Fluid  PENDING 0 - 25 %   Lymphs, Fluid PENDING %   Monocyte-Macrophage-Serous Fluid PENDING 50 - 90 %   Eos, Fluid PENDING %   Other Cells, Fluid PENDING %   *Note: Due to a large number of results and/or encounters for the requested time period, some results have not been displayed. A complete set of results can be found in Results Review.    Recent Results (from the past 240 hour(s))  Resp Panel by RT-PCR (Flu A&B, Covid) Nasopharyngeal Swab     Status: None   Collection Time: 11/10/20  4:54 PM   Specimen: Nasopharyngeal Swab; Nasopharyngeal(NP) swabs in vial transport medium  Result Value Ref Range Status   SARS Coronavirus 2 by RT PCR NEGATIVE NEGATIVE Final    Comment: (NOTE) SARS-CoV-2 target nucleic acids are NOT DETECTED.  The SARS-CoV-2 RNA is generally detectable in upper respiratory specimens during the acute phase of infection. The lowest concentration of SARS-CoV-2 viral copies this assay can detect is 138 copies/mL. A negative result does not preclude SARS-Cov-2 infection and should not be used as the sole basis for treatment or other patient management decisions. A negative result may occur with  improper specimen collection/handling, submission of specimen other than nasopharyngeal swab, presence of viral mutation(s) within the areas targeted by this assay, and inadequate number of viral copies(<138 copies/mL). A negative result must be combined with clinical observations, patient history, and epidemiological information. The expected result is Negative.  Fact Sheet for Patients:  EntrepreneurPulse.com.au  Fact Sheet for Healthcare Providers:  IncredibleEmployment.be  This test is no t yet approved or cleared by the [place] FDA and  has been authorized for detection and/or diagnosis of SARS-CoV-2 by FDA under an Emergency Use Authorization (EUA). This EUA  will remain  in effect (meaning this test can be used) for the duration of the COVID-19 declaration under Section 564(b)(1) of the Act, 21 U.S.C.section 360bbb-3(b)(1), unless the authorization is terminated  or revoked sooner.       Influenza A by PCR NEGATIVE NEGATIVE Final   Influenza B by PCR NEGATIVE NEGATIVE Final    Comment: (NOTE) The Xpert Xpress SARS-CoV-2/FLU/RSV plus assay is intended as an aid in the diagnosis of influenza from Nasopharyngeal swab specimens and should not be used as a sole basis for treatment. Nasal washings and aspirates are unacceptable for Xpert Xpress SARS-CoV-2/FLU/RSV testing.  Fact Sheet for Patients: EntrepreneurPulse.com.au  Fact Sheet for Healthcare Providers: IncredibleEmployment.be  This test is not yet approved or cleared by the [place] FDA and has been authorized for detection and/or diagnosis of SARS-CoV-2 by FDA under an Emergency Use Authorization (EUA). This EUA will remain in effect (meaning this test can be used) for the duration of the COVID-19 declaration under Section 564(b)(1) of the Act, 21 U.S.C. section 360bbb-3(b)(1), unless the authorization is terminated or revoked.  Performed at [facility], [address] [place], [place] [address]   Culture, blood (routine x 2)     Status: None   Collection Time: 11/10/20  6:04 PM   Specimen: BLOOD  Result Value Ref Range Status   Specimen Description   Final    BLOOD PICC LINE Performed at [facility], [address] [place], [place] [address]    Special Requests   Final    BOTTLES DRAWN AEROBIC AND ANAEROBIC Blood Culture adequate volume Performed at [facility], [address], [place], [place] [address]    Culture  Final    NO GROWTH 5 DAYS Performed at [facility], [place] [address], [place], [place] [address]    Report Status 11/16/2020 FINAL  Final  Culture, blood (routine x 2)      Status: None   Collection Time: 11/10/20  6:05 PM   Specimen: BLOOD LEFT ARM  Result Value Ref Range Status   Specimen Description   Final    BLOOD LEFT ARM Performed at [facility], [address] [place], [place] [address]    Special Requests   Final    BOTTLES DRAWN AEROBIC AND ANAEROBIC Blood Culture adequate volume Performed at [facility], [address], [place], [place] [address]    Culture   Final    NO GROWTH 5 DAYS Performed at [facility], [place] [address] [place], [place] [address]    Report Status 11/16/2020 FINAL  Final  Respiratory (~20 pathogens) panel by PCR     Status: None   Collection Time: 11/12/20  1:51 PM  Result Value Ref Range Status   Adenovirus NOT DETECTED NOT DETECTED Final   Coronavirus 229E NOT DETECTED NOT DETECTED Final    Comment: (NOTE) The Coronavirus on the Respiratory Panel, DOES NOT test for the novel  Coronavirus (2019 nCoV)    Coronavirus HKU1 NOT DETECTED NOT DETECTED Final   Coronavirus NL63 NOT DETECTED NOT DETECTED Final   Coronavirus OC43 NOT DETECTED NOT DETECTED Final   Metapneumovirus NOT DETECTED NOT DETECTED Final   Rhinovirus / Enterovirus NOT DETECTED NOT DETECTED Final   Influenza A NOT DETECTED NOT DETECTED Final   Influenza B NOT DETECTED NOT DETECTED Final   Parainfluenza Virus 1 NOT DETECTED NOT DETECTED Final   Parainfluenza Virus 2 NOT DETECTED NOT DETECTED Final   Parainfluenza Virus 3 NOT DETECTED NOT DETECTED Final   Parainfluenza Virus 4 NOT DETECTED NOT DETECTED Final   Respiratory Syncytial Virus NOT DETECTED NOT DETECTED Final   Bordetella pertussis NOT DETECTED NOT DETECTED Final   Bordetella Parapertussis NOT DETECTED NOT DETECTED Final   Chlamydophila pneumoniae NOT DETECTED NOT DETECTED Final   Mycoplasma pneumoniae NOT DETECTED NOT DETECTED Final    Comment: Performed at [facility], [address], [place], [place] [address]  Aerobic/Anaerobic Culture  (surgical/deep wound)     Status: None (Preliminary result)   Collection Time: 11/13/20  8:47 AM   Specimen: Thoracic; Wound  Result Value Ref Range Status   Specimen Description   Final    THORACIC T5 T6 Performed at [place] [address], [place], [place] [address]    Special Requests   Final    Normal Performed at [facility], [place] [address], [place], [place] [address]    Gram Stain   Final    FEW WBC PRESENT,BOTH PMN AND MONONUCLEAR NO ORGANISMS SEEN    Culture   Final    NO GROWTH 4 DAYS NO ANAEROBES ISOLATED; CULTURE IN PROGRESS FOR 5 DAYS Performed at [facility], [place] [address], [place], [place] [address]    Report Status PENDING  Incomplete     Radiology Studies: DG CHEST PORT 1 VIEW  Result Date: 11/17/2020 CLINICAL DATA:  Status post bronchoscopy EXAM: PORTABLE CHEST 1 VIEW COMPARISON:  November 10, 2020 chest radiograph; chest CT Nov 15, 2020 FINDINGS: Central catheter no longer present. Widespread airspace opacity persists throughout the right lung. Small right pleural effusion. Airspace consolidation noted throughout  portions of the right mid and lower lung regions as well as right apex. Left lung is clear. Heart size is normal. Pulmonary vascularity within normal limits. No adenopathy is appreciable by radiography. Postoperative changes noted in much of the mid and lower thoracic and visualized upper lumbar spine regions. IMPRESSION: Extensive airspace opacity throughout the right lung with areas of consolidation in the right apex as well as in the right lower lung region. Small right pleural effusion. Left lung clear. Stable cardiac silhouette. No evident pneumothorax. Electronically Signed   By: Lowella Grip III M.D.   On: 11/17/2020 14:26   VAS [place] UPPER EXTREMITY VENOUS DUPLEX  Result Date: 11/16/2020 UPPER VENOUS STUDY  Patient Name:  MALIN CERVINI  Date of Exam:   11/16/2020 Medical Rec #: [id number]          Accession #:    [id number] Date of Birth: 1946/05/20          Patient Gender: M Patient Age:   75Y Exam Location:  [facility] Procedure:      VAS [place] UPPER EXTREMITY VENOUS DUPLEX Referring Phys: [place] --------------------------------------------------------------------------------  Indications: Swelling Risk Factors: Trauma PICC line. Limitations: Bandages and line. Comparison Study: No prior studies. Performing Technologist: Oliver Hum RVT  Examination Guidelines: A complete evaluation includes B-mode imaging, spectral Doppler, color Doppler, and power Doppler as needed of all accessible portions of each vessel. Bilateral testing is considered an integral part of a complete examination. Limited examinations for reoccurring indications may be performed as noted.  Right Findings: +----------+------------+---------+-----------+----------+-------+ RIGHT     CompressiblePhasicitySpontaneousPropertiesSummary +----------+------------+---------+-----------+----------+-------+ IJV           Full       Yes       Yes                      +----------+------------+---------+-----------+----------+-------+ Subclavian    Full       Yes       Yes                      +----------+------------+---------+-----------+----------+-------+ Axillary      None       No        No                Acute  +----------+------------+---------+-----------+----------+-------+ Brachial    Partial      No        No                Acute  +----------+------------+---------+-----------+----------+-------+ Radial        Full                                          +----------+------------+---------+-----------+----------+-------+ Ulnar         Full                                          +----------+------------+---------+-----------+----------+-------+ Cephalic      Full                                           +----------+------------+---------+-----------+----------+-------+ Basilic  Full                                          +----------+------------+---------+-----------+----------+-------+  Left Findings: +----------+------------+---------+-----------+----------+-------+ LEFT      CompressiblePhasicitySpontaneousPropertiesSummary +----------+------------+---------+-----------+----------+-------+ Subclavian    Full       Yes       Yes                      +----------+------------+---------+-----------+----------+-------+  Summary:  Right: No evidence of superficial vein thrombosis in the upper extremity. Findings consistent with acute deep vein thrombosis involving the right axillary vein and right brachial veins.  Left: No evidence of thrombosis in the subclavian.  *See table(s) above for measurements and observations.  Diagnosing physician: Ruta Hinds MD Electronically signed by Ruta Hinds MD on 11/16/2020 at 2:34:45 PM.    Final    DG C-ARM BRONCHOSCOPY  Result Date: 11/17/2020 C-ARM BRONCHOSCOPY: Fluoroscopy was utilized by the requesting physician.  No radiographic interpretation.   DG CHEST PORT 1 VIEW  Final Result    DG C-ARM BRONCHOSCOPY  Final Result    VAS [place] UPPER EXTREMITY VENOUS DUPLEX  Final Result    CT ANGIO CHEST PE W OR WO CONTRAST  Final Result    IR US Guide Bx Asp/Drain  Final Result    MR THORACIC SPINE W WO CONTRAST  Final Result    CT Angio Chest PE W/Cm &/Or Wo Cm  Final Result    DG Chest Port 1 View  Final Result      Scheduled Meds: . atorvastatin  10 mg Oral Daily  . busPIRone  7.5 mg Oral BID  . Chlorhexidine Gluconate Cloth  6 each Topical Daily  . dextromethorphan-guaiFENesin  1 tablet Oral BID  . famotidine  20 mg Oral Daily  . Ferrous Fumarate  1 tablet Oral Daily  . insulin aspart  0-9 Units Subcutaneous TID WC  . ipratropium-albuterol  3 mL Nebulization TID  . irbesartan  37.5 mg Oral Daily  . metroNIDAZOLE   500 mg Oral Q8H  . pantoprazole  40 mg Oral BID AC  . pregabalin  75 mg Oral BID  . sucralfate  1 g Oral TID WC & HS  . tamsulosin  0.8 mg Oral Daily  . venlafaxine XR  150 mg Oral Q breakfast   PRN Meds: acetaminophen, ALPRAZolam, hydrALAZINE, HYDROcodone-acetaminophen, ipratropium-albuterol, labetalol, morphine injection, sodium chloride, sodium chloride flush, tiZANidine, zolpidem Continuous Infusions: . ceFEPime (MAXIPIME) IV 2 g (11/17/20 1458)  . heparin    . lactated ringers 10 mL/hr at 11/17/20 1106  . vancomycin 1,000 mg (11/16/20 2309)     LOS: 6 days  Time spent: Greater than 50% of the 35 minute visit was spent in counseling/coordination of care for the patient as laid out in the A&P.   Dwyane Dee, MD [facility] 11/17/2020, 3:31 PM

## 2020-11-17 NOTE — Transfer of Care (Signed)
Immediate Anesthesia Transfer of Care Note  Patient: Luis Brown  Procedure(s) Performed: VIDEO BRONCHOSCOPY WITH FLUORO (N/A ) BRONCHIAL BIOPSIES BRONCHIAL BRUSHINGS BRONCHIAL WASHINGS  Patient Location: PACU and Endoscopy Unit  Anesthesia Type:General  Level of Consciousness: awake, alert , oriented and patient cooperative  Airway & Oxygen Therapy: Patient Spontanous Breathing and Patient connected to face mask oxygen  Post-op Assessment: Report given to RN, Post -op Vital signs reviewed and stable and Patient moving all extremities  Post vital signs: Reviewed and stable  Last Vitals:  Vitals Value Taken Time  BP    Temp    Pulse 102 11/17/20 1205  Resp 23 11/17/20 1205  SpO2 88 % 11/17/20 1205  Vitals shown include unvalidated device data.  Last Pain:  Vitals:   11/17/20 1012  TempSrc: Oral  PainSc: 0-No pain      Patients Stated Pain Goal: 0 (15/94/58 5929)  Complications: No complications documented.

## 2020-11-17 NOTE — Anesthesia Preprocedure Evaluation (Addendum)
Anesthesia Evaluation  Patient identified by MRN, date of birth, ID band Patient awake    Reviewed: Allergy & Precautions, NPO status , Patient's Chart, lab work & pertinent test results  Airway Mallampati: I  TM Distance: >3 FB Neck ROM: Full    Dental no notable dental hx. (+) Teeth Intact, Dental Advisory Given   Pulmonary shortness of breath, asthma , pneumonia, unresolved, former smoker,    Pulmonary exam normal breath sounds clear to auscultation       Cardiovascular hypertension, Pt. 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 Right sided pneumonia, radiology query possibility of septic emboli vs daptomycin-possibility of eosinophilic pneumonia. Currently on 4-6L O2 [place] with sats 92-96%.   Reproductive/Obstetrics                            Anesthesia Physical Anesthesia Plan  ASA: III  Anesthesia Plan: General   Post-op Pain Management:    Induction: Intravenous  PONV Risk Score and Plan: 2 and Dexamethasone and Ondansetron  Airway Management Planned: Oral ETT  Additional Equipment:   Intra-op Plan:   Post-operative Plan: Extubation in OR and Possible Post-op intubation/ventilation  Informed Consent: I have reviewed the patients History and Physical, chart, labs and discussed the procedure including the risks, benefits and  alternatives for the proposed anesthesia with the patient or authorized representative who has indicated his/her understanding and acceptance.     Dental advisory given  Plan Discussed with: CRNA  Anesthesia Plan Comments:         Anesthesia Quick Evaluation

## 2020-11-17 NOTE — Progress Notes (Signed)
Subjective:  Patient has worsened over the weekend with worsening anxiety dyspnea and now requiring oxygen   Antibiotics:  Anti-infectives (From admission, onward)   Start     Dose/Rate Route Frequency Ordered Stop   11/15/20 1000  sulfamethoxazole-trimethoprim (BACTRIM DS) 800-160 MG per tablet 1 tablet  Status:  Discontinued        1 tablet Oral Every 12 hours 11/13/20 1441 11/15/20 0912   11/15/20 1000  vancomycin (VANCOREADY) IVPB 1000 mg/200 mL        1,000 mg 200 mL/hr over 60 Minutes Intravenous Every 12 hours 11/15/20 0911     11/14/20 2000  ceFEPIme (MAXIPIME) 2 g in sodium chloride 0.9 % 100 mL IVPB        2 g 200 mL/hr over 30 Minutes Intravenous Every 8 hours 11/14/20 1216     11/11/20 1400  metroNIDAZOLE (FLAGYL) tablet 500 mg        500 mg Oral Every 8 hours 11/11/20 0941     11/11/20 0100  azithromycin (ZITHROMAX) 500 mg in sodium chloride 0.9 % 250 mL IVPB  Status:  Discontinued        500 mg 250 mL/hr over 60 Minutes Intravenous Every 24 hours 11/10/20 2324 11/11/20 0941   11/10/20 1830  ceFEPIme (MAXIPIME) 2 g in sodium chloride 0.9 % 100 mL IVPB  Status:  Discontinued        2 g 200 mL/hr over 30 Minutes Intravenous Every 8 hours 11/10/20 1809 11/14/20 1216   11/10/20 1830  vancomycin (VANCOREADY) IVPB 1750 mg/350 mL  Status:  Discontinued        1,750 mg 175 mL/hr over 120 Minutes Intravenous Every 24 hours 11/10/20 1809 11/10/20 1817   11/10/20 1830  vancomycin (VANCOCIN) IVPB 1000 mg/200 mL premix        1,000 mg 200 mL/hr over 60 Minutes Intravenous Every 12 hours 11/10/20 1817 11/14/20 1925   11/10/20 1816  vancomycin (VANCOCIN) 1000 MG powder       Note to Pharmacy: Guerry Bruin   : cabinet override      11/10/20 1816 11/10/20 1946      Medications: Scheduled Meds: . atorvastatin  10 mg Oral Daily  . busPIRone  7.5 mg Oral BID  . Chlorhexidine Gluconate Cloth  6 each Topical Daily  . dextromethorphan-guaiFENesin  1 tablet Oral BID  .  famotidine  20 mg Oral Daily  . Ferrous Fumarate  1 tablet Oral Daily  . insulin aspart  0-9 Units Subcutaneous TID WC  . ipratropium-albuterol  3 mL Nebulization TID  . irbesartan  37.5 mg Oral Daily  . metroNIDAZOLE  500 mg Oral Q8H  . pantoprazole  40 mg Oral BID AC  . pregabalin  75 mg Oral BID  . sucralfate  1 g Oral TID WC & HS  . tamsulosin  0.8 mg Oral Daily  . venlafaxine XR  150 mg Oral Q breakfast   Continuous Infusions: . ceFEPime (MAXIPIME) IV 2 g (11/17/20 1458)  . heparin    . lactated ringers 10 mL/hr at 11/17/20 1106  . vancomycin 1,000 mg (11/16/20 2309)   PRN Meds:.acetaminophen, ALPRAZolam, hydrALAZINE, HYDROcodone-acetaminophen, ipratropium-albuterol, labetalol, morphine injection, sodium chloride, sodium chloride flush, tiZANidine, zolpidem    Objective: Weight change:   Intake/Output Summary (Last 24 hours) at 11/17/2020 1637 Last data filed at 11/17/2020 1634 Gross per 24 hour  Intake 1224 ml  Output 850 ml  Net 374 ml   Blood pressure Marland Kitchen)  174/90, pulse 88, temperature (!) 97.5 F (36.4 C), temperature source Oral, resp. rate 17, height _0  (1.778 m), weight 75.9 kg, SpO2 94 %. Temp:  [97.5 F (36.4 C)-98.5 F (36.9 C)] 97.5 F (36.4 C) (05/03 1359) Pulse Rate:  [76-99] 88 (05/03 1359) Resp:  [15-25] 17 (05/03 1359) BP: (103-189)/(63-90) 174/90 (05/03 1359) SpO2:  [22 %-96 %] 94 % (05/03 1425) FiO2 (%):  [100 %] 100 % (05/03 1250)  Physical Exam: Physical Exam Constitutional:      Appearance: He is well-developed.  HENT:     Head: Normocephalic and atraumatic.  Eyes:     Conjunctiva/sclera: Conjunctivae normal.  Cardiovascular:     Rate and Rhythm: Normal rate and regular rhythm.  Pulmonary:     Effort: Pulmonary effort is normal.  Abdominal:     General: There is no distension.     Palpations: Abdomen is soft.  Musculoskeletal:        General: Normal range of motion.     Cervical back: Normal range of motion and neck supple.   Skin:    General: Skin is warm and dry.     Findings: No erythema or rash.  Neurological:     General: No focal deficit present.     Mental Status: He is alert and oriented to person, place, and time.  Psychiatric:        Mood and Affect: Mood is depressed.        Behavior: Behavior normal.        Thought Content: Thought content normal.        Judgment: Judgment normal.      CBC:    BMET Recent Labs    11/16/20 0452 11/17/20 0442  NA 136 135  K 4.1 3.4*  CL 103 101  CO2 23 25  GLUCOSE 179* 120*  BUN 14 13  CREATININE 0.57* 0.60*  CALCIUM 8.5* 8.1*     Liver Panel  No results for input(s): PROT, ALBUMIN, AST, ALT, ALKPHOS, BILITOT, BILIDIR, IBILI in the last 72 hours.     Sedimentation Rate No results for input(s): ESRSEDRATE in the last 72 hours. C-Reactive Protein No results for input(s): CRP in the last 72 hours.  Micro Results: Recent Results (from the past 720 hour(s))  Resp Panel by RT-PCR (Flu A&B, Covid) Nasopharyngeal Swab     Status: None   Collection Time: 11/10/20  4:54 PM   Specimen: Nasopharyngeal Swab; Nasopharyngeal(NP) swabs in vial transport medium  Result Value Ref Range Status   SARS Coronavirus 2 by RT PCR NEGATIVE NEGATIVE Final    Comment: (NOTE) SARS-CoV-2 target nucleic acids are NOT DETECTED.  The SARS-CoV-2 RNA is generally detectable in upper respiratory specimens during the acute phase of infection. The lowest concentration of SARS-CoV-2 viral copies this assay can detect is 138 copies/mL. A negative result does not preclude SARS-Cov-2 infection and should not be used as the sole basis for treatment or other patient management decisions. A negative result may occur with  improper specimen collection/handling, submission of specimen other than nasopharyngeal swab, presence of viral mutation(s) within the areas targeted by this assay, and inadequate number of viral copies(<138 copies/mL). A negative result must be combined  with clinical observations, patient history, and epidemiological information. The expected result is Negative.  Fact Sheet for Patients:  EntrepreneurPulse.com.au  Fact Sheet for Healthcare Providers:  IncredibleEmployment.be  This test is no t yet approved or cleared by the [place] and  has been authorized for  detection and/or diagnosis of SARS-CoV-2 by FDA under an Emergency Use Authorization (EUA). This EUA will remain  in effect (meaning this test can be used) for the duration of the COVID-19 declaration under Section 564(b)(1) of the Act, 21 U.S.C.section 360bbb-3(b)(1), unless the authorization is terminated  or revoked sooner.       Influenza A by PCR NEGATIVE NEGATIVE Final   Influenza B by PCR NEGATIVE NEGATIVE Final    Comment: (NOTE) The Xpert Xpress SARS-CoV-2/FLU/RSV plus assay is intended as an aid in the diagnosis of influenza from Nasopharyngeal swab specimens and should not be used as a sole basis for treatment. Nasal washings and aspirates are unacceptable for Xpert Xpress SARS-CoV-2/FLU/RSV testing.  Fact Sheet for Patients: EntrepreneurPulse.com.au  Fact Sheet for Healthcare Providers: IncredibleEmployment.be  This test is not yet approved or cleared by the [place] FDA and has been authorized for detection and/or diagnosis of SARS-CoV-2 by FDA under an Emergency Use Authorization (EUA). This EUA will remain in effect (meaning this test can be used) for the duration of the COVID-19 declaration under Section 564(b)(1) of the Act, 21 U.S.C. section 360bbb-3(b)(1), unless the authorization is terminated or revoked.  Performed at [facility], [address] [place], [place] [address]   Culture, blood (routine x 2)     Status: None   Collection Time: 11/10/20  6:04 PM   Specimen: BLOOD  Result Value Ref Range Status   Specimen Description   Final    BLOOD  PICC LINE Performed at [facility], [address] [place], [place] [address]    Special Requests   Final    BOTTLES DRAWN AEROBIC AND ANAEROBIC Blood Culture adequate volume Performed at [facility], [address] [place], [place] [address]    Culture   Final    NO GROWTH 5 DAYS Performed at [facility], [place] [address] [place], [place] [address]    Report Status 11/16/2020 FINAL  Final  Culture, blood (routine x 2)     Status: None   Collection Time: 11/10/20  6:05 PM   Specimen: BLOOD LEFT ARM  Result Value Ref Range Status   Specimen Description   Final    BLOOD LEFT ARM Performed at [facility], [address] [place], [place] [address]    Special Requests   Final    BOTTLES DRAWN AEROBIC AND ANAEROBIC Blood Culture adequate volume Performed at [facility], [address] [place], [place] [address]    Culture   Final    NO GROWTH 5 DAYS Performed at [facility], [place] [address] [place], [place] [address]    Report Status 11/16/2020 FINAL  Final  Respiratory (~20 pathogens) panel by PCR     Status: None   Collection Time: 11/12/20  1:51 PM  Result Value Ref Range Status   Adenovirus NOT DETECTED NOT DETECTED Final   Coronavirus 229E NOT DETECTED NOT DETECTED Final    Comment: (NOTE) The Coronavirus on the Respiratory Panel, DOES NOT test for the novel  Coronavirus (2019 nCoV)    Coronavirus HKU1 NOT DETECTED NOT DETECTED Final   Coronavirus NL63 NOT DETECTED NOT DETECTED Final   Coronavirus OC43 NOT DETECTED NOT DETECTED Final   Metapneumovirus NOT DETECTED NOT DETECTED Final   Rhinovirus / Enterovirus NOT DETECTED NOT DETECTED Final   Influenza A NOT DETECTED NOT DETECTED Final   Influenza B NOT DETECTED  NOT DETECTED Final   Parainfluenza Virus 1 NOT DETECTED NOT DETECTED Final   Parainfluenza Virus 2 NOT DETECTED NOT DETECTED Final   Parainfluenza Virus 3 NOT DETECTED NOT DETECTED Final    Parainfluenza Virus 4 NOT DETECTED NOT DETECTED Final   Respiratory Syncytial Virus NOT DETECTED NOT DETECTED Final   Bordetella pertussis NOT DETECTED NOT DETECTED Final   Bordetella Parapertussis NOT DETECTED NOT DETECTED Final   Chlamydophila pneumoniae NOT DETECTED NOT DETECTED Final   Mycoplasma pneumoniae NOT DETECTED NOT DETECTED Final    Comment: Performed at [facility], [place] [address], [place], [place] [address]  Aerobic/Anaerobic Culture (surgical/deep wound)     Status: None (Preliminary result)   Collection Time: 11/13/20  8:47 AM   Specimen: Thoracic; Wound  Result Value Ref Range Status   Specimen Description   Final    THORACIC T5 T6 Performed at [place] [address] [place], [place] [address]    Special Requests   Final    Normal Performed at [facility], [place] [address], [place], [place] [address]    Gram Stain   Final    FEW WBC PRESENT,BOTH PMN AND MONONUCLEAR NO ORGANISMS SEEN    Culture   Final    NO GROWTH 4 DAYS NO ANAEROBES ISOLATED; CULTURE IN PROGRESS FOR 5 DAYS Performed at [facility], [place] [address] [place],  [address]    Report Status PENDING  Incomplete    Studies/Results: DG CHEST PORT 1 VIEW  Result Date: 11/17/2020 CLINICAL DATA:  Status post bronchoscopy EXAM: PORTABLE CHEST 1 VIEW COMPARISON:  November 10, 2020 chest radiograph; chest CT Nov 15, 2020 FINDINGS: Central catheter no longer present. Widespread airspace opacity persists throughout the right lung. Small right pleural effusion. Airspace consolidation noted throughout portions of the right mid and lower lung regions as well as right apex. Left lung is clear. Heart size is normal. Pulmonary vascularity within normal limits. No adenopathy is appreciable by radiography. Postoperative changes noted in much of the mid and lower thoracic and visualized upper lumbar spine regions. IMPRESSION: Extensive airspace opacity throughout  the right lung with areas of consolidation in the right apex as well as in the right lower lung region. Small right pleural effusion. Left lung clear. Stable cardiac silhouette. No evident pneumothorax. Electronically Signed   By: Lowella Grip III M.D.   On: 11/17/2020 14:26   VAS [place] UPPER EXTREMITY VENOUS DUPLEX  Result Date: 11/16/2020 UPPER VENOUS STUDY  Patient Name:  JENCARLO BONADONNA  Date of Exam:   11/16/2020 Medical Rec #: [id number]         Accession #:    [id number] Date of Birth: [birth year]          Patient Gender: M Patient Age:   75Y Exam Location:  [facility] Procedure:      VAS [place] UPPER EXTREMITY VENOUS DUPLEX Referring Phys: [place] --------------------------------------------------------------------------------  Indications: Swelling Risk Factors: Trauma PICC line. Limitations: Bandages and line. Comparison Study: No prior studies. Performing Technologist: Oliver Hum RVT  Examination Guidelines: A complete evaluation includes B-mode imaging, spectral Doppler, color Doppler, and power Doppler as needed of all accessible portions of each vessel. Bilateral testing is considered an integral part of a complete examination. Limited examinations for reoccurring indications may be performed as noted.  Right Findings: +----------+------------+---------+-----------+----------+-------+ RIGHT     CompressiblePhasicitySpontaneousPropertiesSummary +----------+------------+---------+-----------+----------+-------+ IJV           Full  Yes       Yes                      +----------+------------+---------+-----------+----------+-------+ Subclavian    Full       Yes       Yes                      +----------+------------+---------+-----------+----------+-------+ Axillary      None       No        No                Acute  +----------+------------+---------+-----------+----------+-------+ Brachial    Partial      No        No                Acute   +----------+------------+---------+-----------+----------+-------+ Radial        Full                                          +----------+------------+---------+-----------+----------+-------+ Ulnar         Full                                          +----------+------------+---------+-----------+----------+-------+ Cephalic      Full                                          +----------+------------+---------+-----------+----------+-------+ Basilic       Full                                          +----------+------------+---------+-----------+----------+-------+  Left Findings: +----------+------------+---------+-----------+----------+-------+ LEFT      CompressiblePhasicitySpontaneousPropertiesSummary +----------+------------+---------+-----------+----------+-------+ Subclavian    Full       Yes       Yes                      +----------+------------+---------+-----------+----------+-------+  Summary:  Right: No evidence of superficial vein thrombosis in the upper extremity. Findings consistent with acute deep vein thrombosis involving the right axillary vein and right brachial veins.  Left: No evidence of thrombosis in the subclavian.  *See table(s) above for measurements and observations.  Diagnosing physician: Ruta Hinds MD Electronically signed by Ruta Hinds MD on 11/16/2020 at 2:34:45 PM.    Final    DG C-ARM BRONCHOSCOPY  Result Date: 11/17/2020 C-ARM BRONCHOSCOPY: Fluoroscopy was utilized by the requesting physician.  No radiographic interpretation.      Assessment/Plan:  Patient is status post bronchoscopy  Principal Problem:   CAP (community acquired pneumonia) Active Problems:   HTN (hypertension)   Diabetes mellitus without complication ([place])   Hyperlipidemia   BPH (benign prostatic hyperplasia)   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HCC)   Sepsis (HCC)   Fluid collection at surgical site   Hypoxia   Wound infection    Acute respiratory failure with hypoxia (HCC)   DVT (deep venous thrombosis) ([place])    Luis Brown is a 75 y.o. 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  He then had loosening of screws and had them removed by Dr. Louanne Skye due to continuous pain at that site.  There is no purulence but culture did yield a Staph epidermidis.  He was placed on oral Bactrim and then seen by my partner Dr. Gale Journey who placed him on IV daptomycin with stop date due in a few days.  He has now been admitted with extensive right-sided pneumonia.  He continues to complain of back pain that is not improved despite IV antibiotics.  #1 Pneumonia:   If he had a bacterial pneumonia he should have responded to his current antimicrobial therapy that: Includes vancomycin cefepime and metronidazole  He has had nearly 8 days of antimicrobial therapy  I think given his clinical worsening he merited Pulmonary CCM consult for consideration bronchoscopy with BAL which has been performed along with TB biopsies  While eosinophilic pneumonia due to daptomycin is rare and the unilateral nature of his lung disease would seem highly unusual this is worth considering as well as multidrug-resistant organisms or unusual pathogen such as nontuberculous mycobacteria or fungi.  I am DC'ing flagyl at this point and will DC cefepime next  followup on BAL data and biopsy results   #2  Hardware associated thoracic spine infection: MRI is reassuring inflammatory markers are up but also could be due to his pneumonia.  His organism is covered with vancomycin for now,   Had plan to switch to Bactrim soon  #3 fluid collection identified on imaging:  This is been aspirated and does not appear to be infected.  Nothing has grown  I spent greater than 35  minutes with the patient including greater than 50% of time in face  to face counsel of the patient and in coordination of his care.      LOS: 6 days   Alcide Evener 11/17/2020, 4:37 PM

## 2020-11-17 NOTE — Progress Notes (Signed)
ANTICOAGULATION CONSULT NOTE   Pharmacy Consult for Heparin Indication: DVT  No Known Allergies  Patient Measurements: Height: 5\' 10"  (177.8 cm) Weight: 75.9 kg (167 lb 5.3 oz) IBW/kg (Calculated) : 73 Heparin Dosing Weight: 76 kg  Vital Signs: Temp: 97.5 F (36.4 C) (05/03 1359) Temp Source: Oral (05/03 1359) BP: 174/90 (05/03 1359) Pulse Rate: 88 (05/03 1359)  Labs: Recent Labs    11/15/20 0313 11/16/20 0452 11/16/20 2156 11/17/20 0442  HGB 11.2* 12.0*  --  11.7*  HCT 34.2* 36.9*  --  36.3*  PLT 247 214  --  276  HEPARINUNFRC  --   --  <0.10*  --   CREATININE 0.59* 0.57*  --  0.60*    Estimated Creatinine Clearance: 79.8 mL/min (A) (by C-G formula based on SCr of 0.6 mg/dL (L)).   Medications:  Scheduled:  . atorvastatin  10 mg Oral Daily  . busPIRone  7.5 mg Oral BID  . Chlorhexidine Gluconate Cloth  6 each Topical Daily  . dextromethorphan-guaiFENesin  1 tablet Oral BID  . famotidine  20 mg Oral Daily  . Ferrous Fumarate  1 tablet Oral Daily  . insulin aspart  0-9 Units Subcutaneous TID WC  . ipratropium-albuterol  3 mL Nebulization TID  . irbesartan  37.5 mg Oral Daily  . metroNIDAZOLE  500 mg Oral Q8H  . pantoprazole  40 mg Oral BID AC  . pregabalin  75 mg Oral BID  . sucralfate  1 g Oral TID WC & HS  . tamsulosin  0.8 mg Oral Daily  . venlafaxine XR  150 mg Oral Q breakfast   Infusions:  . ceFEPime (MAXIPIME) IV 2 g (11/17/20 0538)  . lactated ringers 10 mL/hr at 11/17/20 1106  . vancomycin 1,000 mg (11/16/20 2309)    Assessment: 85 yoM with vertebral osteo, R PICC for IV abx PTA, now with RUE DVT.  Was on Lovenox 40mg  SQ qd for VTE prophylaxis, last dose today 5/2 at 10am. PICC line removed, single L hand IV site, will need another IV site for Abx. Heparin started at 1300 units/hr, no bolus with 5/2 am Lovenox dose given First Heparin level < 0.1, rate was incr to 1550 units/hr, then Heparin stopped 5/3 am at 0730 for Bronch  - Metronidazole  may increase Heparin level  Goal of Therapy:  Heparin level 0.3-0.7 units/ml Monitor platelets by anticoagulation protocol: Yes   Plan:  Bronch completed, per CCM (Dr Ander Slade) can resume Heparin 5/3 after 1600, some oozing noted with Bronch Resume Heparin infusion at 1550 units/hr at 1700 today Check 6 hr Heparin level Daily CBC, plan daily Heparin level at steady state  Minda Ditto PharmD 11/17/2020, 2:45 PM

## 2020-11-17 NOTE — Op Note (Signed)
[facility] Cardiopulmonary Patient Name: Luis Brown Procedure Date: 11/17/2020 MRN: [id number] Attending MD: Laurin Coder MD, MD Date of Birth: 1946-01-20 CSN: [id number] Age: 75 Admit Type: Outpatient Ethnicity: Not Hispanic or Latino Procedure:             Bronchoscopy Indications:           Diagnostic bronchoalveolar lavage, Bacterial pneumonia Providers:             Beverly Ferner A. Ander Slade MD, MD, Mariana Arn, Benetta Spar, Technician Referring MD:           Medicines:             General Anesthesia Complications:         No immediate complications Estimated Blood Loss:  Estimated blood loss: 30 mL requiring treatment with                         epinephrine. Procedure:      Pre-Anesthesia Assessment:      - The anesthesia plan was to use general anesthesia.      After obtaining informed consent, the bronchoscope was passed under       direct vision. Throughout the procedure, the patient's blood pressure,       pulse, and oxygen saturations were monitored continuously. the BF-H190       ([id number] Olympus Bronchoscope was introduced through the mouth, via the       endotracheal tube (the patient was intubated for the procedure) and       advanced to the tracheobronchial tree of both lungs. The procedure was       accomplished without difficulty. The patient tolerated the procedure       well. Findings:      Right Lung Abnormalities: There was no evidence of significant       pathology. BAL was performed in the right lower lobe of the lung and       sent for cell count and differential, routine cytology and bacterial,       AFB, fungal and viral analysis. 100 mL of fluid were instilled. 40 mL       were returned. The return was cellular. There were no mucoid plugs in       the return fluid. Fluoroscopy guided brushings were obtained in the       right lower lobe and sent for routine cytology. Two samples were       obtained.  Transbronchial brushing technique was selected because the       sampling site was not visible endoscopically. Transbronchial biopsies       were performed in the right lower lobe using forceps and sent for       histopathology examination. The procedure was guided by fluoroscopy.       Transbronchial biopsy technique was selected because the sampling site       was not visible endoscopically. Two biopsy passes were performed. Two       biopsy samples were obtained. Impression:      - Bronchoalveolar lavage      - Bacterial pneumonia      - Bronchoalveolar lavage was performed.      - Brushings were obtained.      - Transbronchial lung biopsies were performed. Moderate Sedation:  An independent trained observer was present and continuously monitored       the patient. Recommendation:      - Await BAL, biopsy, brushing, culture and cytology results. Procedure Code(s):      --- Professional ---      [phone], Bronchoscopy, rigid or flexible, including fluoroscopic guidance,       when performed; with transbronchial lung biopsy(s), single lobe      03833, Bronchoscopy, rigid or flexible, including fluoroscopic guidance,       when performed; with bronchial alveolar lavage      [phone], Bronchoscopy, rigid or flexible, including fluoroscopic guidance,       when performed; with brushing or protected brushings Diagnosis Code(s):      --- Professional ---      J15.9, Unspecified bacterial pneumonia CPT copyright 2019 [employer]. All rights reserved. The codes documented in this report are preliminary and upon coder review may  be revised to meet current compliance requirements. Sherrilyn Rist, MD Laurin Coder MD, MD 11/17/2020 11:57:26 AM This report has been signed electronically. Number of Addenda: 0 Scope In: Scope Out:

## 2020-11-17 NOTE — Progress Notes (Signed)
NAME:  Luis Brown, MRN:  [id number], DOB:  1946/05/06, LOS: 6 ADMISSION DATE:  11/10/2020, CONSULTATION DATE:  11/16/2020 REFERRING MD:  Dr Cleophas Dunker, CHIEF COMPLAINT: Pneumonia  History of Present Illness:  Patient was admitted with shortness of breath Feels his breathing has been getting worse the last couple of weeks   Was home on antibiotics and daptomycin for osteomyelitis, shortness of breath diaphoresis noted to be hypoxemic in the office and was referred to the emergency department for evaluation, since has been in the hospital he feels his breathing did worsen  Started on empiric antibiotics for pneumonia however breathing continues to worsen, had a repeat CT scan showing progressive infiltration  In the right lung  Pertinent  Medical History   Past Medical History:  Diagnosis Date  . Acute pharyngitis 10/21/2013  . Allergy    grass and pollen  . Anxiety and depression 10/25/2011  . BPH (benign prostatic hyperplasia) 04/23/2012  . Chicken pox as a child  . DDD (degenerative disc disease)    cervical responds to steroid injections and low back required surgery  . DDD (degenerative disc disease), lumbosacral   . Diabetes mellitus    pre  . Dyspnea   . ED (erectile dysfunction) 04/23/2012  . Elevated BP   . 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 Esophageal reflux 02/10/2015  . Fatigue   . HTN (hypertension)   . Hyperglycemia    preDM   . Hyperlipidemia   . Insomnia   . Low back pain radiating to both legs 01/16/2017  . Measles as a child  . Medicare annual wellness visit, subsequent 02/10/2015  . Overweight(278.02)   . Peripheral neuropathy 08/07/2019  . Personal history of colonic polyps 10/27/2012   Follows with [facility]  . Pneumonia    " walking"  . Preventative health care   . 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  . Wears glasses      Significant Hospital Events: Including procedures, antibiotic start and stop dates in addition to other pertinent events    . On antibiotics for possible pneumonia  Interim History / Subjective:  Breathing feels about the same, cough no significant expectoration  Objective   Blood pressure 103/63, pulse 76, temperature 97.6 F (36.4 C), temperature source Oral, resp. rate (!) 22, height _0  (1.778 m), weight 75.9 kg, SpO2 94 %.        Intake/Output Summary (Last 24 hours) at 11/17/2020 2595 Last data filed at 11/17/2020 0600 Gross per 24 hour  Intake 610 ml  Output 550 ml  Net 60 ml   Filed Weights   11/10/20 2230  Weight: 75.9 kg    Examination: General: Elderly gentleman, on oxygen supplementation, does not appear to be in distress HENT: Moist oral mucosa Lungs: Rales at the bases on the right Cardiovascular: S1-S2 appreciated  Abdomen: Bowel sounds appreciated Extremities: No clubbing, no edema Neuro: Alert and oriented x3 GU:   Labs/imaging that I havepersonally reviewed  (right click and "Reselect all SmartList Selections" daily)  Chest x-ray reviewed, CT scan reviewed, labs reviewed and CBC and Chem-7  Resolved Hospital Problem list     Assessment & Plan:  Progressive pneumonia Pleural effusion He was on daptomycin -Patient agreeable to proceed with bronchoscopy  With daptomycin-possibility of eosinophilic pneumonia -BAL will help, looking for eosinophilia -Will take biopsies to rule out any other infiltrative process  Upper extremity DVT -On anticoagulation, -Anticoagulation on hold for bronchoscopy  History of subacute osteomyelitis of the thoracic spine  BPH Hyperlipidemia  Scheduled for bronchoscopy this morning  Questions answered regarding procedure   Sherrilyn Rist, MD [place] [facility] Pager: [phone]

## 2020-11-17 NOTE — Anesthesia Procedure Notes (Signed)
Procedure Name: Intubation Date/Time: 11/17/2020 11:15 AM Performed by: Victoriano Lain, CRNA Pre-anesthesia Checklist: Patient identified, Emergency Drugs available, Suction available, Patient being monitored and Timeout performed Patient Re-evaluated:Patient Re-evaluated prior to induction Oxygen Delivery Method: Circle system utilized Preoxygenation: Pre-oxygenation with 100% oxygen Induction Type: IV induction Laryngoscope Size: Mac and 4 Grade View: Grade I Tube type: Oral Tube size: 8.5 mm Number of attempts: 1 Airway Equipment and Method: Stylet Placement Confirmation: ETT inserted through vocal cords under direct vision,  positive ETCO2 and breath sounds checked- equal and bilateral Secured at: 22 cm Tube secured with: Tape Dental Injury: Teeth and Oropharynx as per pre-operative assessment

## 2020-11-17 NOTE — OR Nursing (Signed)
Dr Ander Slade to recovery room bay and aware of patients oxygenation of 86-88% on [facility] and stated patient is ok to return to room/level of care at this time

## 2020-11-17 NOTE — Anesthesia Postprocedure Evaluation (Signed)
Anesthesia Post Note  Patient: Luis Brown  Procedure(s) Performed: VIDEO BRONCHOSCOPY WITH FLUORO (N/A ) BRONCHIAL BIOPSIES BRONCHIAL BRUSHINGS BRONCHIAL WASHINGS HEMOSTASIS CONTROL     Patient location during evaluation: Endoscopy Anesthesia Type: General Level of consciousness: awake and alert Pain management: pain level controlled Vital Signs Assessment: post-procedure vital signs reviewed and stable Respiratory status: spontaneous breathing, nonlabored ventilation, respiratory function stable and patient connected to nasal cannula oxygen Cardiovascular status: blood pressure returned to baseline and stable Postop Assessment: no apparent nausea or vomiting Anesthetic complications: no   No complications documented.  Last Vitals:  Vitals:   11/17/20 1310 11/17/20 1359  BP: (!) 166/67 (!) 174/90  Pulse: 94 88  Resp: 15 17  Temp:  (!) 36.4 C  SpO2: (!) 89% 95%    Last Pain:  Vitals:   11/17/20 1359  TempSrc: Oral  PainSc:                  Thelonious Kauffmann L Kolbe [place]

## 2020-11-18 ENCOUNTER — Encounter (HOSPITAL_COMMUNITY): Payer: Self-pay | Admitting: Pulmonary Disease

## 2020-11-18 DIAGNOSIS — N400 Enlarged prostate without lower urinary tract symptoms: Secondary | ICD-10-CM | POA: Diagnosis not present

## 2020-11-18 DIAGNOSIS — J189 Pneumonia, unspecified organism: Secondary | ICD-10-CM | POA: Diagnosis not present

## 2020-11-18 DIAGNOSIS — T148XXA Other injury of unspecified body region, initial encounter: Secondary | ICD-10-CM

## 2020-11-18 DIAGNOSIS — E871 Hypo-osmolality and hyponatremia: Secondary | ICD-10-CM

## 2020-11-18 DIAGNOSIS — J9601 Acute respiratory failure with hypoxia: Secondary | ICD-10-CM | POA: Diagnosis not present

## 2020-11-18 DIAGNOSIS — E782 Mixed hyperlipidemia: Secondary | ICD-10-CM

## 2020-11-18 DIAGNOSIS — R0602 Shortness of breath: Secondary | ICD-10-CM

## 2020-11-18 DIAGNOSIS — L089 Local infection of the skin and subcutaneous tissue, unspecified: Secondary | ICD-10-CM

## 2020-11-18 DIAGNOSIS — J8281 Chronic eosinophilic pneumonia: Secondary | ICD-10-CM

## 2020-11-18 DIAGNOSIS — E119 Type 2 diabetes mellitus without complications: Secondary | ICD-10-CM | POA: Diagnosis not present

## 2020-11-18 LAB — AEROBIC/ANAEROBIC CULTURE W GRAM STAIN (SURGICAL/DEEP WOUND)
Culture: NO GROWTH
Special Requests: NORMAL

## 2020-11-18 LAB — BASIC METABOLIC PANEL
Anion gap: 6 (ref 5–15)
BUN: 21 mg/dL (ref 8–23)
CO2: 25 mmol/L (ref 22–32)
Calcium: 8.1 mg/dL — ABNORMAL LOW (ref 8.9–10.3)
Chloride: 102 mmol/L (ref 98–111)
Creatinine, Ser: 0.58 mg/dL — ABNORMAL LOW (ref 0.61–1.24)
GFR, Estimated: >60 mL/min (ref >60)
Glucose, Bld: 260 mg/dL — ABNORMAL HIGH (ref 70–99)
Potassium: 4.1 mmol/L (ref 3.5–5.1)
Sodium: 133 mmol/L — ABNORMAL LOW (ref 135–145)

## 2020-11-18 LAB — CBC WITH DIFFERENTIAL/PLATELET
Abs Immature Granulocytes: 0.3 10*3/uL — ABNORMAL HIGH (ref 0.00–0.07)
Basophils Absolute: 0 10*3/uL (ref 0.0–0.1)
Basophils Relative: 0 %
Eosinophils Absolute: 0 10*3/uL (ref 0.0–0.5)
Eosinophils Relative: 0 %
HCT: 34.2 % — ABNORMAL LOW (ref 39.0–52.0)
Hemoglobin: 11.1 g/dL — ABNORMAL LOW (ref 13.0–17.0)
Immature Granulocytes: 2 %
Lymphocytes Relative: 5 %
Lymphs Abs: 0.7 10*3/uL (ref 0.7–4.0)
MCH: 29.9 pg (ref 26.0–34.0)
MCHC: 32.5 g/dL (ref 30.0–36.0)
MCV: 92.2 fL (ref 80.0–100.0)
Monocytes Absolute: 1.2 10*3/uL — ABNORMAL HIGH (ref 0.1–1.0)
Monocytes Relative: 9 %
Neutro Abs: 11.5 10*3/uL — ABNORMAL HIGH (ref 1.7–7.7)
Neutrophils Relative %: 84 %
Platelets: 237 10*3/uL (ref 150–400)
RBC: 3.71 MIL/uL — ABNORMAL LOW (ref 4.22–5.81)
RDW: 14 % (ref 11.5–15.5)
WBC: 13.8 10*3/uL — ABNORMAL HIGH (ref 4.0–10.5)
nRBC: 0 % (ref 0.0–0.2)

## 2020-11-18 LAB — PROCALCITONIN: Procalcitonin: <0.1 ng/mL

## 2020-11-18 LAB — CYTOLOGY - NON PAP

## 2020-11-18 LAB — HEPARIN LEVEL (UNFRACTIONATED)
Heparin Unfractionated: 0.43 IU/mL (ref 0.30–0.70)
Heparin Unfractionated: 0.23 IU/mL — ABNORMAL LOW (ref 0.30–0.70)
Heparin Unfractionated: 0.32 IU/mL (ref 0.30–0.70)

## 2020-11-18 LAB — HEMOGLOBIN A1C
Hgb A1c MFr Bld: 6.9 % — ABNORMAL HIGH (ref 4.8–5.6)
Mean Plasma Glucose: 151.33 mg/dL

## 2020-11-18 LAB — MAGNESIUM: Magnesium: 2 mg/dL (ref 1.7–2.4)

## 2020-11-18 LAB — GLUCOSE, CAPILLARY
Glucose-Capillary: 178 mg/dL — ABNORMAL HIGH (ref 70–99)
Glucose-Capillary: 245 mg/dL — ABNORMAL HIGH (ref 70–99)
Glucose-Capillary: 157 mg/dL — ABNORMAL HIGH (ref 70–99)
Glucose-Capillary: 165 mg/dL — ABNORMAL HIGH (ref 70–99)

## 2020-11-18 LAB — SEDIMENTATION RATE: Sed Rate: 9 mm/hr (ref 0–16)

## 2020-11-18 LAB — SURGICAL PATHOLOGY

## 2020-11-18 MED ORDER — METHYLPREDNISOLONE SODIUM SUCC 125 MG IJ SOLR
125.0000 mg | Freq: Four times a day (QID) | INTRAMUSCULAR | Status: AC
  Administered 2020-11-18 – 2020-11-20 (×9): 125 mg via INTRAVENOUS
  Filled 2020-11-18 (×9): qty 2

## 2020-11-18 MED ORDER — HEPARIN BOLUS VIA INFUSION
1000.0000 [IU] | Freq: Once | INTRAVENOUS | Status: AC
  Administered 2020-11-18: 1000 [IU] via INTRAVENOUS
  Filled 2020-11-18: qty 1000

## 2020-11-18 MED ORDER — INSULIN ASPART 100 UNIT/ML IJ SOLN
0.0000 [IU] | INTRAMUSCULAR | Status: DC
  Administered 2020-11-18: 4 [IU] via SUBCUTANEOUS
  Administered 2020-11-19: 15 [IU] via SUBCUTANEOUS
  Administered 2020-11-19: 4 [IU] via SUBCUTANEOUS
  Administered 2020-11-19: 7 [IU] via SUBCUTANEOUS
  Administered 2020-11-19 (×2): 11 [IU] via SUBCUTANEOUS
  Administered 2020-11-19 – 2020-11-20 (×2): 15 [IU] via SUBCUTANEOUS
  Administered 2020-11-20 (×2): 7 [IU] via SUBCUTANEOUS
  Administered 2020-11-20: 11 [IU] via SUBCUTANEOUS
  Administered 2020-11-20: 7 [IU] via SUBCUTANEOUS
  Administered 2020-11-20 (×2): 11 [IU] via SUBCUTANEOUS
  Administered 2020-11-21: 15 [IU] via SUBCUTANEOUS
  Administered 2020-11-21: 4 [IU] via SUBCUTANEOUS
  Administered 2020-11-21: 7 [IU] via SUBCUTANEOUS
  Administered 2020-11-21: 4 [IU] via SUBCUTANEOUS
  Administered 2020-11-21: 11 [IU] via SUBCUTANEOUS
  Administered 2020-11-21: 7 [IU] via SUBCUTANEOUS
  Administered 2020-11-22: 20 [IU] via SUBCUTANEOUS
  Administered 2020-11-22: 11 [IU] via SUBCUTANEOUS
  Administered 2020-11-22: 4 [IU] via SUBCUTANEOUS
  Administered 2020-11-22: 11 [IU] via SUBCUTANEOUS
  Administered 2020-11-22: 3 [IU] via SUBCUTANEOUS
  Administered 2020-11-23: 4 [IU] via SUBCUTANEOUS
  Administered 2020-11-23: 3 [IU] via SUBCUTANEOUS
  Administered 2020-11-23: 7 [IU] via SUBCUTANEOUS

## 2020-11-18 MED ORDER — SULFAMETHOXAZOLE-TRIMETHOPRIM 800-160 MG PO TABS
1.0000 | ORAL_TABLET | Freq: Two times a day (BID) | ORAL | Status: DC
  Administered 2020-11-18 – 2020-11-23 (×10): 1 via ORAL
  Filled 2020-11-18 (×10): qty 1

## 2020-11-18 NOTE — Progress Notes (Signed)
Physical Therapy Treatment Patient Details Name: Luis Brown MRN: [id number] DOB: [birth year] Today's Date: 11/18/2020    History of Present Illness Pt is 75 yo male who presented to ED with hypoxia on 11/10/20 and admitted with atypical pnemonia.  Additionally, pt had MRI T spine and found to have subcutaneous seroma and is s/p aspiration on 11/13/20.  He has PMH including recent vertebral osteomyelitis requiring removal of hardware on 09/14/20 still on antibiotics, peripheral neuropathy, HLC, HTN, DM, DDD, BPH, anxiety and depression.    PT Comments    Pt on increased O2 today. O2: 95% on 6L at rest; 80% on 6L with ambulation. Dyspnea 3/4 + anxiety. Cues for pursed lip breathing as needed (pt does well). Fatigues fairly easily-walked ~20 feet before needing to sit 2* fatigue, dyspnea, drop in O2 sats. Will continue to follow and progress activity as tolerated.     Follow Up Recommendations  Outpatient PT     Equipment Recommendations   (wife states they have access to a rollator at home)    Recommendations for Other Services       Precautions / Restrictions Precautions Precautions: Fall Precaution Comments: monitor O2 Restrictions Weight Bearing Restrictions: No    Mobility  Bed Mobility               General bed mobility comments: oob in recliner    Transfers Overall transfer level: Needs assistance Equipment used: 4-wheeled walker Transfers: Sit to/from Stand Sit to Stand: Min assist         General transfer comment: Assist to rise, steady. Cues for safety, hand placement, proper operation of rollator.  Ambulation/Gait Min Assist Gait Distance (Feet): 20 Feet (x2) Assistive device: 4-wheeled walker Gait Pattern/deviations: Step-through pattern;Decreased stride length     General Gait Details: Unsteady-multifactorial. Assist to stabilize intermittently, even with use of rollator. O2 80% on 6L [place] during ambulation. Seated rest break taken/needed. Cues for  pursed lip breathing   Stairs             Wheelchair Mobility    Modified Rankin (Stroke Patients Only)       Balance Overall balance assessment: Needs assistance         Standing balance support: Bilateral upper extremity supported Standing balance-Leahy Scale: Fair                              Cognition Arousal/Alertness: Awake/alert Behavior During Therapy: WFL for tasks assessed/performed Overall Cognitive Status: Within Functional Limits for tasks assessed                                        Exercises      General Comments        Pertinent Vitals/Pain Pain Assessment: Faces Faces Pain Scale: Hurts a little bit Pain Location: back Pain Descriptors / Indicators: Discomfort;Sore Pain Intervention(s): Monitored during session    Home Living                      Prior Function            PT Goals (current goals can now be found in the care plan section) Progress towards PT goals: Progressing toward goals    Frequency    Min 3X/week      PT Plan Current plan remains appropriate    Co-evaluation  AM-PAC PT "6 Clicks" Mobility   Outcome Measure  Help needed turning from your back to your side while in a flat bed without using bedrails?: None Help needed moving from lying on your back to sitting on the side of a flat bed without using bedrails?: None Help needed moving to and from a bed to a chair (including a wheelchair)?: A Little Help needed standing up from a chair using your arms (e.g., wheelchair or bedside chair)?: A Little Help needed to walk in hospital room?: A Little Help needed climbing 3-5 steps with a railing? : A Lot 6 Click Score: 19    End of Session Equipment Utilized During Treatment: Oxygen Activity Tolerance: Patient limited by fatigue (limited by drop in O2 sats, dyspnea) Patient left: in chair;with call bell/phone within reach;with family/visitor present    PT Visit Diagnosis: Other abnormalities of gait and mobility (R26.89);Unsteadiness on feet (R26.81)     Time: 9379-0240 PT Time Calculation (min) (ACUTE ONLY): 28 min  Charges:  $Gait Training: 23-37 mins                        Doreatha Massed, PT Acute Rehabilitation  Office: [phone] Pager: [phone]

## 2020-11-18 NOTE — Progress Notes (Signed)
Progress Note  [name]   IFO:[id number]  DOB: [birth year]  DOA: 11/10/2020     7  PCP: Elise Benne  CC: SOB  Hospital Course: [name] is a 75 yo male with PMH vertebral OM (was on 6 wk Dapto course PTA), peripheral neuropathy, HLD, HTN, DMII, DDD, BPH, anxiety/depression who presented to the hospital with SOB.  He initially presented to his PCPs office and was found to be hypoxic.  He was referred to the ER.  He underwent work-up with CTA chest which showed a large right sided PNA with ground glass nodules and concern for central cavitation.  He was started on IV antibiotics on admission.  Infectious disease was consulted given prolonged antibiotic course at home and now with atypical pneumonia.  Blood cultures were obtained on admission.  Repeat MRI T-spine and echo were also ordered. The echo was negative for valvular vegetation.  MRI was negative for deep infection but did reveal a subcutaneous probable seroma.  IR was consulted per ID for aspiration/drainage and culturing of fluid.  He underwent aspiration on 11/13/2020.  Fluid studies were sent for culture and did not grow (appearance was also non-infectious).  He had some swelling and irritation associated with his PICC line.  He underwent right upper extremity duplex which was positive for DVT.  He was started on heparin drip on 11/16/2020 and PICC line was removed. Due to worsening respiratory symptoms, increasing oxygen requirements, he underwent repeat CTA chest.  This showed worsening right-sided pneumonia.  Pulmonology was consulted and he also underwent bronchoscopy on 11/17/2020.   Review of systems/interval History:  No acute issues or events overnight, patient states he did not sleep very well but otherwise denies chest pain, worsening shortness of breath nausea vomiting diarrhea headache fevers or chills.  He does report ongoing constipation.  He also reports ongoing dyspnea but appears to be stabilizing from  previous.  Also notes his anxiety is well controlled currently on Xanax.  Lengthy discussion about increasing ambulation and activity today as tolerated, he is using his incentive spirometry and flutter a few times per hour which we discussed will likely help his respiratory status.    Assessment & Plan:  CAP (community acquired pneumonia) - CAP with higher risk MDRO due to prolonged IV abx (dapto) and recent exposure to healthcare - continue vanc, cefepime; Flagyl added 4/27, azithromycin previously discontinued per ID - CTA read on 4/26 suggests right sided cavitary lesions - follow up blood cultures remain negative - MRI T spine shows probable seroma; IR consulted per ID and plan is for aspiration/drainage on 11/13/20 (3cc drained, non infected appearing) - follow up fluid studies from aspiration: no organisms - continue flutter and IS - ambulate Qshift; PT ordered  - patient not improving; placed on O2 on 4/30 and now having worsening pleuritic CP, SOB, cough (anxiety also driving large component of this). Repeat CTA on 5/1 shows worsening right sided PNA, small effusion, no PE - pulmonology consulted 5/2. Patient underwent bronch 5/3. Follow up bronch studies  DVT (deep venous thrombosis) ([place]) - RUE pain on 11/16/2020 - US shows acute DVT noted in right axillary and brachial veins - PICC removed  - Start on heparin per pharmacy; should be okay to transition to [place] in 1-2 days pending course - heparin to resume after bronch on 5/3 at 4pm  Acute respiratory failure with hypoxia ([place]) - New as of 4/30.  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 Seen by ID on 09/28/20 (prior to this had also been on a prolonged abx course for prior infection). Plan was: 6 weeks Dapto followed by Bactrim up to 3 months if tolerates - s ID following; continue on Vanc/cefepime for now while in hospital  - repeat MRI T-spine 11/11/20: no epidural abscess. New fluid collection in SQ tissue T3-4 to T6-7 measuring 6 cm, considered a seroma (to be aspirated for culture on 4/29) -Will transition vancomycin to Bactrim once pneumonia further treated  BPH (benign prostatic hyperplasia) - Continue Flomax  Hyperlipidemia - Continue Lipitor  Diabetes mellitus without complication ([place]) - continue SSI and CBG monitoring  - last A1c 6.3% on 09/08/20  HTN (hypertension) - Continue Avapro  Hyponatremia-resolved as of 11/11/2020 - Na 130 on admission; suspected hypovolemia - improved  Antimicrobials: Azithromycin 11/11/20 x 1 Vancomycin 11/10/2020 >> current Cefepime 11/10/2020 >> current  Flagyl 11/11/2020 >> 11/17/20  DVT prophylaxis:  Code Status:   Code Status: Full Code Family Communication: wife at bedside  Disposition Plan: Status is: Inpatient  Remains inpatient appropriate because:Ongoing diagnostic testing needed not appropriate for outpatient work up, IV treatments appropriate due to intensity of illness or inability to take PO and Inpatient level of care appropriate due to severity of illness   Dispo: The patient is from: Home              Anticipated d/c is to: Home >72h              Patient currently is not medically stable to d/c.   Difficult to place patient No  Risk of unplanned readmission score: Unplanned Admission- Pilot do not use: 20.13   Objective: Blood pressure (!) 153/79, pulse 90, temperature 97.9 F (36.6 C), temperature source Oral, resp. rate 19,  height _0  (1.778 m), weight 75.9 kg, SpO2 98 %.  Examination: General appearance: Pleasant but anxious adult man resting in bed appearing comfortable and in no distress.  Does appear more fatigued and is on 6 L oxygen Head: Normocephalic, without obvious abnormality, atraumatic Eyes: EOMI Lungs: Right-sided coarse breath sounds with rhonchi.  No wheezing.  Heart: regular rate and rhythm and S1, S2 normal Abdomen: normal findings: bowel sounds normal and soft, non-tender Extremities: extremities normal, atraumatic, no cyanosis or edema Skin: mobility and turgor normal Neurologic: Grossly normal  Consultants:   ID  IR  Pulmonology  Procedures:   Thoracic fluid collection drainage with IR: 11/13/2020  Data Reviewed: I have personally reviewed following labs and imaging studies Results for orders placed or performed during the hospital encounter of 11/10/20 (from the past 24 hour(s))  Glucose, capillary     Status: Abnormal   Collection Time: 11/17/20  7:22 AM  Result Value Ref Range   Glucose-Capillary 142 (H) 70 - 99 mg/dL  Body fluid cell count with differential     Status: Abnormal   Collection Time: 11/17/20 11:48 AM  Result Value Ref Range   Fluid Type-FCT BRONCHIAL ALVEOLAR LAVAGE    Color, Fluid COLORLESS (A) YELLOW   Appearance, Fluid  HAZY (A) CLEAR   Total Nucleated Cell Count, Fluid 72 0 - 1,000 cu mm   Other Cells, Fluid  %    UNABLE TO DISCERN CELL MORPHOLOGY FOR DIFFERENTIAL DUE TO POOR STAINING QUALITY AND DAMAGED APPEARANCE TO THE CELLS.  Culture, respiratory     Status: None (Preliminary result)   Collection Time: 11/17/20 11:48 AM   Specimen: Bronchoalveolar Lavage; Respiratory  Result Value Ref Range   Specimen Description      BRONCHIAL ALVEOLAR LAVAGE Performed at [facility] [address], [place], [place] [address]    Special Requests      NONE Performed at [facility], [place] [address] [place],  [place] [address]    Gram Stain      RARE WBC PRESENT, PREDOMINANTLY PMN NO ORGANISMS SEEN Performed at [facility], [place] [address] [place], [place] [address]    Culture PENDING    Report Status PENDING   Glucose, capillary     Status: Abnormal   Collection Time: 11/17/20  4:21 PM  Result Value Ref Range   Glucose-Capillary 211 (H) 70 - 99 mg/dL  Glucose, capillary     Status: Abnormal   Collection Time: 11/17/20  9:00 PM  Result Value Ref Range   Glucose-Capillary 355 (H) 70 - 99 mg/dL  Procalcitonin     Status: None   Collection Time: 11/18/20  2:20 AM  Result Value Ref Range   Procalcitonin <0.10 ng/mL  Basic metabolic panel     Status: Abnormal   Collection Time: 11/18/20  2:20 AM  Result Value Ref Range   Sodium 133 (L) 135 - 145 mmol/L   Potassium 4.1 3.5 - 5.1 mmol/L   Chloride 102 98 - 111 mmol/L   CO2 25 22 - 32 mmol/L   Glucose, Bld 260 (H) 70 - 99 mg/dL   BUN 21 8 - 23 mg/dL   Creatinine, Ser 0.58 (L) 0.61 - 1.24 mg/dL   Calcium 8.1 (L) 8.9 - 10.3 mg/dL   GFR, Estimated >60 >60 mL/min   Anion gap 6 5 - 15  CBC with Differential/Platelet     Status: Abnormal   Collection Time: 11/18/20  2:20 AM  Result Value Ref Range   WBC 13.8 (H) 4.0 - 10.5 K/uL   RBC 3.71 (L) 4.22 - 5.81 MIL/uL   Hemoglobin 11.1 (L) 13.0 - 17.0 g/dL   HCT 34.2 (L) 39.0 - 52.0 %   MCV 92.2 80.0 - 100.0 fL   MCH 29.9 26.0 - 34.0 pg   MCHC 32.5 30.0 - 36.0 g/dL   RDW 14.0 11.5 - 15.5 %   Platelets 237 150 - 400 K/uL   nRBC 0.0 0.0 - 0.2 %   Neutrophils Relative % 84 %   Neutro Abs 11.5 (H) 1.7 - 7.7 K/uL   Lymphocytes Relative 5 %   Lymphs Abs 0.7 0.7 - 4.0 K/uL   Monocytes Relative 9 %   Monocytes Absolute 1.2 (H) 0.1 - 1.0 K/uL   Eosinophils Relative 0 %   Eosinophils Absolute 0.0 0.0 - 0.5 K/uL   Basophils Relative 0 %   Basophils Absolute 0.0 0.0 - 0.1 K/uL   Immature Granulocytes 2 %   Abs Immature Granulocytes 0.30 (H) 0.00 - 0.07 K/uL  Magnesium     Status: None    Collection Time: 11/18/20  2:20 AM  Result Value Ref Range   Magnesium 2.0 1.7 - 2.4 mg/dL  Heparin level (unfractionated)  Status: None   Collection Time: 11/18/20  2:20 AM  Result Value Ref Range   Heparin Unfractionated 0.43 0.30 - 0.70 IU/mL   *Note: Due to a large number of results and/or encounters for the requested time period, some results have not been displayed. A complete set of results can be found in Results Review.    Recent Results (from the past 240 hour(s))  Resp Panel by RT-PCR (Flu A&B, Covid) Nasopharyngeal Swab     Status: None   Collection Time: 11/10/20  4:54 PM   Specimen: Nasopharyngeal Swab; Nasopharyngeal(NP) swabs in vial transport medium  Result Value Ref Range Status   SARS Coronavirus 2 by RT PCR NEGATIVE NEGATIVE Final    Comment: (NOTE) SARS-CoV-2 target nucleic acids are NOT DETECTED.  The SARS-CoV-2 RNA is generally detectable in upper respiratory specimens during the acute phase of infection. The lowest concentration of SARS-CoV-2 viral copies this assay can detect is 138 copies/mL. A negative result does not preclude SARS-Cov-2 infection and should not be used as the sole basis for treatment or other patient management decisions. A negative result may occur with  improper specimen collection/handling, submission of specimen other than nasopharyngeal swab, presence of viral mutation(s) within the areas targeted by this assay, and inadequate number of viral copies(<138 copies/mL). A negative result must be combined with clinical observations, patient history, and epidemiological information. The expected result is Negative.  Fact Sheet for Patients:  EntrepreneurPulse.com.au  Fact Sheet for Healthcare Providers:  IncredibleEmployment.be  This test is no t yet approved or cleared by the [place] FDA and  has been authorized for detection and/or diagnosis of SARS-CoV-2 by FDA under an Emergency Use  Authorization (EUA). This EUA will remain  in effect (meaning this test can be used) for the duration of the COVID-19 declaration under Section 564(b)(1) of the Act, 21 U.S.C.section 360bbb-3(b)(1), unless the authorization is terminated  or revoked sooner.       Influenza A by PCR NEGATIVE NEGATIVE Final   Influenza B by PCR NEGATIVE NEGATIVE Final    Comment: (NOTE) The Xpert Xpress SARS-CoV-2/FLU/RSV plus assay is intended as an aid in the diagnosis of influenza from Nasopharyngeal swab specimens and should not be used as a sole basis for treatment. Nasal washings and aspirates are unacceptable for Xpert Xpress SARS-CoV-2/FLU/RSV testing.  Fact Sheet for Patients: EntrepreneurPulse.com.au  Fact Sheet for Healthcare Providers: IncredibleEmployment.be  This test is not yet approved or cleared by the [place] FDA and has been authorized for detection and/or diagnosis of SARS-CoV-2 by FDA under an Emergency Use Authorization (EUA). This EUA will remain in effect (meaning this test can be used) for the duration of the COVID-19 declaration under Section 564(b)(1) of the Act, 21 U.S.C. section 360bbb-3(b)(1), unless the authorization is terminated or revoked.  Performed at [facility], [address] [place], [place] [address]   Culture, blood (routine x 2)     Status: None   Collection Time: 11/10/20  6:04 PM   Specimen: BLOOD  Result Value Ref Range Status   Specimen Description   Final    BLOOD PICC LINE Performed at [facility], [address] [place], [place] [address]    Special Requests   Final    BOTTLES DRAWN AEROBIC AND ANAEROBIC Blood Culture adequate volume Performed at [facility], [place] [place], [place] [address]    Culture   Final    NO GROWTH 5 DAYS  Performed at [facility], [place] [address], [place], [place] [address]    Report Status 11/16/2020 FINAL  Final   Culture, blood (routine x 2)     Status: None   Collection Time: 11/10/20  6:05 PM   Specimen: BLOOD LEFT ARM  Result Value Ref Range Status   Specimen Description   Final    BLOOD LEFT ARM Performed at [facility], [place] [place], [place] [address]    Special Requests   Final    BOTTLES DRAWN AEROBIC AND ANAEROBIC Blood Culture adequate volume Performed at [facility], [address], [place], [place] [address]    Culture   Final    NO GROWTH 5 DAYS Performed at [facility], [place] [address] [place], [place] [address]    Report Status 11/16/2020 FINAL  Final  Respiratory (~20 pathogens) panel by PCR     Status: None   Collection Time: 11/12/20  1:51 PM  Result Value Ref Range Status   Adenovirus NOT DETECTED NOT DETECTED Final   Coronavirus 229E NOT DETECTED NOT DETECTED Final    Comment: (NOTE) The Coronavirus on the Respiratory Panel, DOES NOT test for the novel  Coronavirus (2019 nCoV)    Coronavirus HKU1 NOT DETECTED NOT DETECTED Final   Coronavirus NL63 NOT DETECTED NOT DETECTED Final   Coronavirus OC43 NOT DETECTED NOT DETECTED Final   Metapneumovirus NOT DETECTED NOT DETECTED Final   Rhinovirus / Enterovirus NOT DETECTED NOT DETECTED Final   Influenza A NOT DETECTED NOT DETECTED Final   Influenza B NOT DETECTED NOT DETECTED Final   Parainfluenza Virus 1 NOT DETECTED NOT DETECTED Final   Parainfluenza Virus 2 NOT DETECTED NOT DETECTED Final   Parainfluenza Virus 3 NOT DETECTED NOT DETECTED Final   Parainfluenza Virus 4 NOT DETECTED NOT DETECTED Final   Respiratory Syncytial Virus NOT DETECTED NOT DETECTED Final   Bordetella pertussis NOT DETECTED NOT DETECTED Final   Bordetella Parapertussis NOT DETECTED NOT DETECTED Final   Chlamydophila pneumoniae NOT DETECTED NOT DETECTED Final   Mycoplasma pneumoniae NOT DETECTED NOT DETECTED Final    Comment: Performed at [facility], [address], [place], [place] [address]   Aerobic/Anaerobic Culture (surgical/deep wound)     Status: None (Preliminary result)   Collection Time: 11/13/20  8:47 AM   Specimen: Thoracic; Wound  Result Value Ref Range Status   Specimen Description   Final    THORACIC T5 T6 Performed at [facility] [address] [place], [place] [address]    Special Requests   Final    Normal Performed at [facility], [place] [address] [place], [place] [address]    Gram Stain   Final    FEW WBC PRESENT,BOTH PMN AND MONONUCLEAR NO ORGANISMS SEEN    Culture   Final    NO GROWTH 4 DAYS NO ANAEROBES ISOLATED; CULTURE IN PROGRESS FOR 5 DAYS Performed at [facility], [place] [address], [place], [place] [address]    Report Status PENDING  Incomplete  Culture, respiratory     Status: None (Preliminary result)   Collection Time: 11/17/20 11:48 AM   Specimen: Bronchoalveolar Lavage; Respiratory  Result Value Ref Range Status   Specimen Description   Final    BRONCHIAL ALVEOLAR LAVAGE Performed at [facility] [address], [place], [place] [address]    Special Requests   Final    NONE  Performed at [facility], [place] [address], [place], [place] [address]    Gram Stain   Final    RARE WBC PRESENT, PREDOMINANTLY PMN NO ORGANISMS SEEN Performed at [facility], [place] [address], [place], [place] [address]    Culture PENDING  Incomplete   Report Status PENDING  Incomplete     Radiology Studies: DG CHEST PORT 1 VIEW  Result Date: 11/17/2020 CLINICAL DATA:  Status post bronchoscopy EXAM: PORTABLE CHEST 1 VIEW COMPARISON:  November 10, 2020 chest radiograph; chest CT Nov 15, 2020 FINDINGS: Central catheter no longer present. Widespread airspace opacity persists throughout the right lung. Small right pleural effusion. Airspace consolidation noted throughout portions of the right mid and lower lung regions as well as right apex. Left lung is clear. Heart size is  normal. Pulmonary vascularity within normal limits. No adenopathy is appreciable by radiography. Postoperative changes noted in much of the mid and lower thoracic and visualized upper lumbar spine regions. IMPRESSION: Extensive airspace opacity throughout the right lung with areas of consolidation in the right apex as well as in the right lower lung region. Small right pleural effusion. Left lung clear. Stable cardiac silhouette. No evident pneumothorax. Electronically Signed   By: Lowella Grip III M.D.   On: 11/17/2020 14:26   VAS [place] UPPER EXTREMITY VENOUS DUPLEX  Result Date: 11/16/2020 UPPER VENOUS STUDY  Patient Name:  Luis Brown  Date of Exam:   11/16/2020 Medical Rec #: [id number]         Accession #:    [id number] Date of Birth: August 02, 1945          Patient Gender: M Patient Age:   75Y Exam [facility]  [facility] Procedure:      VAS [place] UPPER EXTREMITY VENOUS DUPLEX Referring Phys: [place] --------------------------------------------------------------------------------  Indications: Swelling Risk Factors: Trauma PICC line. Limitations: Bandages and line. Comparison Study: No prior studies. Performing Technologist: Oliver Hum RVT  Examination Guidelines: A complete evaluation includes B-mode imaging, spectral Doppler, color Doppler, and power Doppler as needed of all accessible portions of each vessel. Bilateral testing is considered an integral part of a complete examination. Limited examinations for reoccurring indications may be performed as noted.  Right Findings: +----------+------------+---------+-----------+----------+-------+ RIGHT     CompressiblePhasicitySpontaneousPropertiesSummary +----------+------------+---------+-----------+----------+-------+ IJV           Full       Yes       Yes                      +----------+------------+---------+-----------+----------+-------+ Subclavian    Full       Yes       Yes                       +----------+------------+---------+-----------+----------+-------+ Axillary      None       No        No                Acute  +----------+------------+---------+-----------+----------+-------+ Brachial    Partial      No        No                Acute  +----------+------------+---------+-----------+----------+-------+ Radial        Full                                          +----------+------------+---------+-----------+----------+-------+  Ulnar         Full                                          +----------+------------+---------+-----------+----------+-------+ Cephalic      Full                                          +----------+------------+---------+-----------+----------+-------+ Basilic       Full                                          +----------+------------+---------+-----------+----------+-------+  Left Findings: +----------+------------+---------+-----------+----------+-------+ LEFT      CompressiblePhasicitySpontaneousPropertiesSummary +----------+------------+---------+-----------+----------+-------+ Subclavian    Full       Yes       Yes                      +----------+------------+---------+-----------+----------+-------+  Summary:  Right: No evidence of superficial vein thrombosis in the upper extremity. Findings consistent with acute deep vein thrombosis involving the right axillary vein and right brachial veins.  Left: No evidence of thrombosis in the subclavian.  *See table(s) above for measurements and observations.  Diagnosing physician: Ruta Hinds MD Electronically signed by Ruta Hinds MD on 11/16/2020 at 2:34:45 PM.    Final    DG C-ARM BRONCHOSCOPY  Result Date: 11/17/2020 C-ARM BRONCHOSCOPY: Fluoroscopy was utilized by the requesting physician.  No radiographic interpretation.   DG CHEST PORT 1 VIEW  Final Result    DG C-ARM BRONCHOSCOPY  Final Result    VAS [place] UPPER EXTREMITY VENOUS DUPLEX  Final Result     CT ANGIO CHEST PE W OR WO CONTRAST  Final Result    IR US Guide Bx Asp/Drain  Final Result    MR THORACIC SPINE W WO CONTRAST  Final Result    CT Angio Chest PE W/Cm &/Or Wo Cm  Final Result    DG Chest Port 1 View  Final Result      Scheduled Meds: . atorvastatin  10 mg Oral Daily  . busPIRone  7.5 mg Oral BID  . Chlorhexidine Gluconate Cloth  6 each Topical Daily  . dextromethorphan-guaiFENesin  1 tablet Oral BID  . famotidine  20 mg Oral Daily  . Ferrous Fumarate  1 tablet Oral Daily  . insulin aspart  0-9 Units Subcutaneous TID AC & HS  . ipratropium-albuterol  3 mL Nebulization TID  . irbesartan  37.5 mg Oral Daily  . pantoprazole  40 mg Oral BID AC  . pregabalin  75 mg Oral BID  . sucralfate  1 g Oral TID WC & HS  . tamsulosin  0.8 mg Oral Daily  . venlafaxine XR  150 mg Oral Q breakfast   PRN Meds: acetaminophen, ALPRAZolam, hydrALAZINE, HYDROcodone-acetaminophen, ipratropium-albuterol, labetalol, morphine injection, sodium chloride, sodium chloride flush, tiZANidine, zolpidem Continuous Infusions: . ceFEPime (MAXIPIME) IV 2 g (11/18/20 0602)  . heparin 1,550 Units/hr (11/17/20 1838)  . lactated ringers 10 mL/hr at 11/17/20 1106  . vancomycin 1,000 mg (11/17/20 2315)     LOS: 7 days  Time spent: Greater than 50% of the 35 minute visit was spent in counseling/coordination of care for the patient as  laid out in the A&P.   Little Ishikawa, DO [facility] 11/18/2020, 7:12 AM

## 2020-11-18 NOTE — Progress Notes (Signed)
Pharmacy Antibiotic Note  Luis Brown is a 75 y.o. male admitted on 11/10/2020 with pneumonia, hx vertebral OM.  Pharmacy has been consulted for vancomycin dosing.  Pt has PMH significant for hardware associated thoracic spine infection was on daptomycin PTA with planned stop date of 5/1 followed by transition to SMX/TMP. Pt admitted with PNA and has been on broad spectrum antibiotics (vancomycin + cefepime + metronidazole) during admission. ID consulted.   Vancomycin course initially scheduled to end on 4/30, pharmacy re-consulted to resume vancomycin 5/1   Today, 11/18/20   Day 8 of full dose IV antibiotics  WBC remains slightly elevated  SCr stable, WNL  Afebrile  Plan:  Continue Vancomycin 1000 mg q12h - vertebral osteomyelitis  Continue cefepime 2 g IV q8h   Flagyl d/c by ID, considering d/c Cefepime  Goal vancomycin AUC 400-550  Monitor renal function, vancomycin levels at steady state as needed  Height: _0  (177.8 cm) Weight: 75.9 kg (167 lb 5.3 oz) IBW/kg (Calculated) : 73  Temp (24hrs), Avg:97.7 F (36.5 C), Min:97.5 F (36.4 C), Max:97.9 F (36.6 C)  Recent Labs  Lab 11/14/20 0421 11/15/20 0313 11/16/20 0452 11/17/20 0442 11/18/20 0220  WBC 11.1* 11.5* 11.2* 12.2* 13.8*  CREATININE 0.65 0.59* 0.57* 0.60* 0.58*    Estimated Creatinine Clearance: 79.8 mL/min (A) (by C-G formula based on SCr of 0.58 mg/dL (L)).  No Known Allergies   Cefepime 4/26 >>  Vancomycin 4/26 >>  Metronidazole 4/27 >> 5/3 Azithromycin 4/27 x1  4/26 BCx: ng-final 4/27 Strep pneumo: neg 4/27 Legionella: neg 4/28 resp panel: negative 4/29 Wound T5-T6: no organisms seen 5/3 BAL: no organisms seen 5/3 AFB, Fungus Cx, Resp panel  Thank you for allowing pharmacy to be a part of this patient's care.  Minda Ditto, PharmD 11/18/2020 12:35 PM

## 2020-11-18 NOTE — Progress Notes (Signed)
ANTICOAGULATION CONSULT NOTE   Pharmacy Consult for Heparin Indication: DVT  No Known Allergies  Patient Measurements: Height: 5\' 10"  (177.8 cm) Weight: 75.9 kg (167 lb 5.3 oz) IBW/kg (Calculated) : 73 Heparin Dosing Weight: 76 kg  Vital Signs: Temp: 98.1 F (36.7 C) (05/04 2115) Temp Source: Oral (05/04 2115) BP: 112/67 (05/04 1541) Pulse Rate: 94 (05/04 2100)  Labs: Recent Labs    11/16/20 0452 11/16/20 2156 11/17/20 0442 11/18/20 0220 11/18/20 1050 11/18/20 2047  HGB 12.0*  --  11.7* 11.1*  --   --   HCT 36.9*  --  36.3* 34.2*  --   --   PLT 214  --  276 237  --   --   HEPARINUNFRC  --    < >  --  0.43 0.23* 0.32  CREATININE 0.57*  --  0.60* 0.58*  --   --    < > = values in this interval not displayed.   Estimated Creatinine Clearance: 79.8 mL/min (A) (by C-G formula based on SCr of 0.58 mg/dL (L)).  Assessment: 84 yoM with vertebral osteo, R PICC for IV abx PTA, now with RUE DVT.  Was on Lovenox 40mg  SQ qd for VTE prophylaxis, last dose today 5/2 at 10am. PICC line removed, single L hand IV site, will need another IV site for Abx. Heparin started at 1300 units/hr, no bolus with 5/2 am Lovenox dose given First Heparin level < 0.1, rate was incr to 1550 units/hr, then Heparin stopped 5/3 am at 0730 for Bronch  - Metronidazole may increase Heparin level -Bronch completed, per CCM (Dr Ander Slade) can resume Heparin 5/3 after 1600, some oozing noted with Bronch  11/18/2020 2nd shift 2047 HL 0.32-  therapeutic after heparin 1000 unit bolus & heparin rate increased to 1650 units/hr Hgb 11.1, stable, plts WNL, No bleeding noted   Goal of Therapy:  Heparin level 0.3-0.7 units/ml Monitor platelets by anticoagulation protocol: Yes   Plan:  contiue Heparin drip at 1650 units/hr Daily CBC, daily Heparin level Best case would be transition to [place], Pharm.D 11/18/2020 9:38 PM

## 2020-11-18 NOTE — Progress Notes (Signed)
ANTICOAGULATION CONSULT NOTE   Pharmacy Consult for Heparin Indication: DVT  No Known Allergies  Patient Measurements: Height: 5\' 10"  (177.8 cm) Weight: 75.9 kg (167 lb 5.3 oz) IBW/kg (Calculated) : 73 Heparin Dosing Weight: 76 kg  Vital Signs: Temp: 97.7 F (36.5 C) (05/03 2345) Temp Source: Oral (05/03 2345) BP: 114/61 (05/03 2345) Pulse Rate: 72 (05/03 2345)  Labs: Recent Labs    11/16/20 0452 11/16/20 2156 11/17/20 0442 11/18/20 0220  HGB 12.0*  --  11.7* 11.1*  HCT 36.9*  --  36.3* 34.2*  PLT 214  --  276 237  HEPARINUNFRC  --  <0.10*  --  0.43  CREATININE 0.57*  --  0.60* 0.58*    Estimated Creatinine Clearance: 79.8 mL/min (A) (by C-G formula based on SCr of 0.58 mg/dL (L)).   Medications:  Scheduled:  . atorvastatin  10 mg Oral Daily  . busPIRone  7.5 mg Oral BID  . Chlorhexidine Gluconate Cloth  6 each Topical Daily  . dextromethorphan-guaiFENesin  1 tablet Oral BID  . famotidine  20 mg Oral Daily  . Ferrous Fumarate  1 tablet Oral Daily  . insulin aspart  0-9 Units Subcutaneous TID AC & HS  . ipratropium-albuterol  3 mL Nebulization TID  . irbesartan  37.5 mg Oral Daily  . pantoprazole  40 mg Oral BID AC  . pregabalin  75 mg Oral BID  . sucralfate  1 g Oral TID WC & HS  . tamsulosin  0.8 mg Oral Daily  . venlafaxine XR  150 mg Oral Q breakfast   Infusions:  . ceFEPime (MAXIPIME) IV 2 g (11/17/20 2154)  . heparin 1,550 Units/hr (11/17/20 1838)  . lactated ringers 10 mL/hr at 11/17/20 1106  . vancomycin 1,000 mg (11/17/20 2315)    Assessment: 72 yoM with vertebral osteo, R PICC for IV abx PTA, now with RUE DVT.  Was on Lovenox 40mg  SQ qd for VTE prophylaxis, last dose today 5/2 at 10am. PICC line removed, single L hand IV site, will need another IV site for Abx. Heparin started at 1300 units/hr, no bolus with 5/2 am Lovenox dose given First Heparin level < 0.1, rate was incr to 1550 units/hr, then Heparin stopped 5/3 am at 0730 for Bronch  -  Metronidazole may increase Heparin level -Bronch completed, per CCM (Dr Ander Slade) can resume Heparin 5/3 after 1600, some oozing noted with Bronch  11/18/2020 HL 0.43 therapeutic on 1550 units/hr Hgb 11.1, stable plts WNL No bleeding noted   Goal of Therapy:  Heparin level 0.3-0.7 units/ml Monitor platelets by anticoagulation protocol: Yes   Plan:  Continue heparin drip at 1550 units/hr Check confirmatory level in 8 hours Daily CBC, plan daily Heparin level at steady state  [place] 11/18/2020, 3:05 AM

## 2020-11-18 NOTE — Progress Notes (Signed)
Subjective:  No new complaints. Patient believes he is feeling better   Antibiotics:  Anti-infectives (From admission, onward)   Start     Dose/Rate Route Frequency Ordered Stop   11/18/20 2200  sulfamethoxazole-trimethoprim (BACTRIM DS) 800-160 MG per tablet 1 tablet        1 tablet Oral Every 12 hours 11/18/20 1404     11/15/20 1000  sulfamethoxazole-trimethoprim (BACTRIM DS) 800-160 MG per tablet 1 tablet  Status:  Discontinued        1 tablet Oral Every 12 hours 11/13/20 1441 11/15/20 0912   11/15/20 1000  vancomycin (VANCOREADY) IVPB 1000 mg/200 mL  Status:  Discontinued        1,000 mg 200 mL/hr over 60 Minutes Intravenous Every 12 hours 11/15/20 0911 11/18/20 1356   11/14/20 2000  ceFEPIme (MAXIPIME) 2 g in sodium chloride 0.9 % 100 mL IVPB  Status:  Discontinued        2 g 200 mL/hr over 30 Minutes Intravenous Every 8 hours 11/14/20 1216 11/18/20 1356   11/11/20 1400  metroNIDAZOLE (FLAGYL) tablet 500 mg  Status:  Discontinued        500 mg Oral Every 8 hours 11/11/20 0941 11/17/20 1641   11/11/20 0100  azithromycin (ZITHROMAX) 500 mg in sodium chloride 0.9 % 250 mL IVPB  Status:  Discontinued        500 mg 250 mL/hr over 60 Minutes Intravenous Every 24 hours 11/10/20 2324 11/11/20 0941   11/10/20 1830  ceFEPIme (MAXIPIME) 2 g in sodium chloride 0.9 % 100 mL IVPB  Status:  Discontinued        2 g 200 mL/hr over 30 Minutes Intravenous Every 8 hours 11/10/20 1809 11/14/20 1216   11/10/20 1830  vancomycin (VANCOREADY) IVPB 1750 mg/350 mL  Status:  Discontinued        1,750 mg 175 mL/hr over 120 Minutes Intravenous Every 24 hours 11/10/20 1809 11/10/20 1817   11/10/20 1830  vancomycin (VANCOCIN) IVPB 1000 mg/200 mL premix        1,000 mg 200 mL/hr over 60 Minutes Intravenous Every 12 hours 11/10/20 1817 11/14/20 1925   11/10/20 1816  vancomycin (VANCOCIN) 1000 MG powder       Note to Pharmacy: Guerry Bruin   : cabinet override      11/10/20 1816 11/10/20 1946       Medications: Scheduled Meds: . atorvastatin  10 mg Oral Daily  . busPIRone  7.5 mg Oral BID  . Chlorhexidine Gluconate Cloth  6 each Topical Daily  . dextromethorphan-guaiFENesin  1 tablet Oral BID  . famotidine  20 mg Oral Daily  . Ferrous Fumarate  1 tablet Oral Daily  . insulin aspart  0-20 Units Subcutaneous Q4H  . ipratropium-albuterol  3 mL Nebulization TID  . irbesartan  37.5 mg Oral Daily  . methylPREDNISolone (SOLU-MEDROL) injection  125 mg Intravenous Q6H  . pantoprazole  40 mg Oral BID AC  . pregabalin  75 mg Oral BID  . sucralfate  1 g Oral TID WC & HS  . sulfamethoxazole-trimethoprim  1 tablet Oral Q12H  . tamsulosin  0.8 mg Oral Daily  . venlafaxine XR  150 mg Oral Q breakfast   Continuous Infusions: . heparin 1,650 Units/hr (11/18/20 1238)  . lactated ringers 10 mL/hr at 11/17/20 1106   PRN Meds:.acetaminophen, ALPRAZolam, hydrALAZINE, HYDROcodone-acetaminophen, ipratropium-albuterol, labetalol, morphine injection, sodium chloride, sodium chloride flush, tiZANidine, zolpidem    Objective: Weight change:   Intake/Output  Summary (Last 24 hours) at 11/18/2020 1809 Last data filed at 11/18/2020 1800 Gross per 24 hour  Intake 460.11 ml  Output 900 ml  Net -439.89 ml   Blood pressure 112/67, pulse 77, temperature 99.4 F (37.4 C), temperature source Oral, resp. rate 16, height _0  (1.778 m), weight 75.9 kg, SpO2 92 %. Temp:  [97.7 F (36.5 C)-99.4 F (37.4 C)] 99.4 F (37.4 C) (05/04 1541) Pulse Rate:  [60-108] 77 (05/04 1541) Resp:  [16-28] 16 (05/04 1541) BP: (107-157)/(56-79) 112/67 (05/04 1541) SpO2:  [88 %-98 %] 92 % (05/04 1421)  Physical Exam: Physical Exam HENT:     Head: Normocephalic and atraumatic.  Eyes:     Conjunctiva/sclera: Conjunctivae normal.  Cardiovascular:     Rate and Rhythm: Normal rate and regular rhythm.  Pulmonary:     Effort: Pulmonary effort is normal.     Breath sounds: Examination of the right-middle field reveals  rhonchi. Examination of the right-lower field reveals rhonchi. Rhonchi present.  Abdominal:     General: There is no distension.     Palpations: Abdomen is soft.  Musculoskeletal:        General: Normal range of motion.     Cervical back: Normal range of motion and neck supple.  Skin:    General: Skin is warm and dry.     Findings: No erythema or rash.  Neurological:     General: No focal deficit present.     Mental Status: He is alert and oriented to person, place, and time.  Psychiatric:        Mood and Affect: Mood is depressed.        Behavior: Behavior normal.        Thought Content: Thought content normal.        Judgment: Judgment normal.      CBC:    BMET Recent Labs    11/17/20 0442 11/18/20 0220  NA 135 133*  K 3.4* 4.1  CL 101 102  CO2 25 25  GLUCOSE 120* 260*  BUN 13 21  CREATININE 0.60* 0.58*  CALCIUM 8.1* 8.1*     Liver Panel  No results for input(s): PROT, ALBUMIN, AST, ALT, ALKPHOS, BILITOT, BILIDIR, IBILI in the last 72 hours.     Sedimentation Rate No results for input(s): ESRSEDRATE in the last 72 hours. C-Reactive Protein No results for input(s): CRP in the last 72 hours.  Micro Results: Recent Results (from the past 720 hour(s))  Resp Panel by RT-PCR (Flu A&B, Covid) Nasopharyngeal Swab     Status: None   Collection Time: 11/10/20  4:54 PM   Specimen: Nasopharyngeal Swab; Nasopharyngeal(NP) swabs in vial transport medium  Result Value Ref Range Status   SARS Coronavirus 2 by RT PCR NEGATIVE NEGATIVE Final    Comment: (NOTE) SARS-CoV-2 target nucleic acids are NOT DETECTED.  The SARS-CoV-2 RNA is generally detectable in upper respiratory specimens during the acute phase of infection. The lowest concentration of SARS-CoV-2 viral copies this assay can detect is 138 copies/mL. A negative result does not preclude SARS-Cov-2 infection and should not be used as the sole basis for treatment or other patient management decisions. A  negative result may occur with  improper specimen collection/handling, submission of specimen other than nasopharyngeal swab, presence of viral mutation(s) within the areas targeted by this assay, and inadequate number of viral copies(<138 copies/mL). A negative result must be combined with clinical observations, patient history, and epidemiological information. The expected result is Negative.  Fact  Sheet for Patients:  EntrepreneurPulse.com.au  Fact Sheet for Healthcare Providers:  IncredibleEmployment.be  This test is no t yet approved or cleared by the [place] FDA and  has been authorized for detection and/or diagnosis of SARS-CoV-2 by FDA under an Emergency Use Authorization (EUA). This EUA will remain  in effect (meaning this test can be used) for the duration of the COVID-19 declaration under Section 564(b)(1) of the Act, 21 U.S.C.section 360bbb-3(b)(1), unless the authorization is terminated  or revoked sooner.       Influenza A by PCR NEGATIVE NEGATIVE Final   Influenza B by PCR NEGATIVE NEGATIVE Final    Comment: (NOTE) The Xpert Xpress SARS-CoV-2/FLU/RSV plus assay is intended as an aid in the diagnosis of influenza from Nasopharyngeal swab specimens and should not be used as a sole basis for treatment. Nasal washings and aspirates are unacceptable for Xpert Xpress SARS-CoV-2/FLU/RSV testing.  Fact Sheet for Patients: EntrepreneurPulse.com.au  Fact Sheet for Healthcare Providers: IncredibleEmployment.be  This test is not yet approved or cleared by the [place] FDA and has been authorized for detection and/or diagnosis of SARS-CoV-2 by FDA under an Emergency Use Authorization (EUA). This EUA will remain in effect (meaning this test can be used) for the duration of the COVID-19 declaration under Section 564(b)(1) of the Act, 21 U.S.C. section 360bbb-3(b)(1), unless the authorization  is terminated or revoked.  Performed at [facility], [address] [place], [place] [address]   Culture, blood (routine x 2)     Status: None   Collection Time: 11/10/20  6:04 PM   Specimen: BLOOD  Result Value Ref Range Status   Specimen Description   Final    BLOOD PICC LINE Performed at [facility], [address] [place], [place] [address]    Special Requests   Final    BOTTLES DRAWN AEROBIC AND ANAEROBIC Blood Culture adequate volume Performed at [facility], [address] [place], [place] [address]    Culture   Final    NO GROWTH 5 DAYS Performed at [facility], [place] [address], [place], [place] [address]    Report Status 11/16/2020 FINAL  Final  Culture, blood (routine x 2)     Status: None   Collection Time: 11/10/20  6:05 PM   Specimen: BLOOD LEFT ARM  Result Value Ref Range Status   Specimen Description   Final    BLOOD LEFT ARM Performed at [facility], [address] [place], [place] [address]    Special Requests   Final    BOTTLES DRAWN AEROBIC AND ANAEROBIC Blood Culture adequate volume Performed at [facility], [address] [place], [place] [address]    Culture   Final    NO GROWTH 5 DAYS Performed at [facility], [place] [address] [place], [place] [address]    Report Status 11/16/2020 FINAL  Final  Respiratory (~20 pathogens) panel by PCR     Status: None   Collection Time: 11/12/20  1:51 PM  Result Value Ref Range Status   Adenovirus NOT DETECTED NOT DETECTED Final   Coronavirus 229E NOT DETECTED NOT DETECTED Final    Comment: (NOTE) The Coronavirus on the Respiratory Panel, DOES NOT test for the novel  Coronavirus (2019 nCoV)    Coronavirus HKU1 NOT DETECTED NOT DETECTED Final   Coronavirus NL63 NOT DETECTED NOT DETECTED Final   Coronavirus OC43 NOT DETECTED NOT DETECTED  Final   Metapneumovirus NOT DETECTED NOT DETECTED Final   Rhinovirus / Enterovirus NOT DETECTED  NOT DETECTED Final   Influenza A NOT DETECTED NOT DETECTED Final   Influenza B NOT DETECTED NOT DETECTED Final   Parainfluenza Virus 1 NOT DETECTED NOT DETECTED Final   Parainfluenza Virus 2 NOT DETECTED NOT DETECTED Final   Parainfluenza Virus 3 NOT DETECTED NOT DETECTED Final   Parainfluenza Virus 4 NOT DETECTED NOT DETECTED Final   Respiratory Syncytial Virus NOT DETECTED NOT DETECTED Final   Bordetella pertussis NOT DETECTED NOT DETECTED Final   Bordetella Parapertussis NOT DETECTED NOT DETECTED Final   Chlamydophila pneumoniae NOT DETECTED NOT DETECTED Final   Mycoplasma pneumoniae NOT DETECTED NOT DETECTED Final    Comment: Performed at [facility], [place] [address], [place], [place] [address]  Aerobic/Anaerobic Culture (surgical/deep wound)     Status: None   Collection Time: 11/13/20  8:47 AM   Specimen: Thoracic; Wound  Result Value Ref Range Status   Specimen Description   Final    THORACIC T5 T6 Performed at [place] [address] [place], [place] [address]    Special Requests   Final    Normal Performed at [facility], [place] [address], [place], [place] [address]    Gram Stain   Final    FEW WBC PRESENT,BOTH PMN AND MONONUCLEAR NO ORGANISMS SEEN    Culture   Final    No growth aerobically or anaerobically. Performed at [facility], [place] [address] [place], [place] [address]    Report Status 11/18/2020 FINAL  Final  Culture, respiratory     Status: None (Preliminary result)   Collection Time: 11/17/20 11:48 AM   Specimen: Bronchoalveolar Lavage; Respiratory  Result Value Ref Range Status   Specimen Description   Final    BRONCHIAL ALVEOLAR LAVAGE Performed at [place] [address], [place], [place] [address]    Special Requests   Final    NONE Performed at [facility], [place] [address] [place], [place] [address]    Gram Stain   Final    RARE WBC PRESENT,  PREDOMINANTLY PMN NO ORGANISMS SEEN    Culture   Final    NO GROWTH < 24 HOURS Performed at [place] [address] [place], [place] [address]    Report Status PENDING  Incomplete    Studies/Results: DG CHEST PORT 1 VIEW  Result Date: 11/17/2020 CLINICAL DATA:  Status post bronchoscopy EXAM: PORTABLE CHEST 1 VIEW COMPARISON:  November 10, 2020 chest radiograph; chest CT Nov 15, 2020 FINDINGS: Central catheter no longer present. Widespread airspace opacity persists throughout the right lung. Small right pleural effusion. Airspace consolidation noted throughout portions of the right mid and lower lung regions as well as right apex. Left lung is clear. Heart size is normal. Pulmonary vascularity within normal limits. No adenopathy is appreciable by radiography. Postoperative changes noted in much of the mid and lower thoracic and visualized upper lumbar spine regions. IMPRESSION: Extensive airspace opacity throughout the right lung with areas of consolidation in the right apex as well as in the right lower lung region. Small right pleural effusion. Left lung clear. Stable cardiac silhouette. No evident pneumothorax. Electronically Signed   By: Lowella Grip III M.D.   On: 11/17/2020 14:26   DG C-ARM BRONCHOSCOPY  Result Date: 11/17/2020 C-ARM BRONCHOSCOPY: Fluoroscopy was utilized by the requesting physician.  No radiographic interpretation.  Assessment/Plan:  Bronchoscopy and biopsy performed  Principal Problem:   CAP (community acquired pneumonia) Active Problems:   HTN (hypertension)   Diabetes mellitus without complication (HCC)   Hyperlipidemia   BPH (benign prostatic hyperplasia)   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HCC)   S/P bronchoscopy   Sepsis ([place])   Fluid collection at surgical site   Hypoxia   Wound infection   Acute respiratory failure with hypoxia (HCC)   DVT (deep venous thrombosis) ([place])    Luis Brown is a 75 y.o. 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  He then had loosening of screws and had them removed by Dr. Louanne Skye due to continuous pain at that site.  There is no purulence but culture did yield a Staph epidermidis.  He was placed on oral Bactrim and then seen by my partner Dr. Gale Journey who placed him on IV daptomycin with stop date due in a few days.  He has now been admitted with extensive right-sided pneumonia.  He continues to complain of back pain that is not improved despite IV antibiotics.  #1 Pneumonia:   If he had a bacterial pneumonia he should have responded to his current antimicrobial therapy that: Includes vancomycin cefepime and metronidazole  He has had nearly 10  days of antimicrobial therapy   While eosinophilic pneumonia due to daptomycin is rare and the unilateral nature of his lung disease would seem highly unusual this is worth considering as well as multidrug-resistant organisms or unusual pathogen such as nontuberculous mycobacteria or fungi.    I am DC'ing flagyl at this point and will DC cefepime next  BAL did not show purulence  Cultures without growth   One biopsy with path is nondiagnostic  He is starting steroids for possible eosinophilic pneumonia and this makes sense as a likely cause for his condition  He has received more than adequate treatment for bacterial pneumonia  We will stop vancomycin and cefepime   #2  Hardware associated thoracic spine infection: MRI is reassuring inflammatory markers are up but also could be due to his pneumonia.  We are switching to bactrim DS BID (which will also provide PJP prevention)  I spent greater than 35 minutes with the patient including greater than 50% of time in face to face counsel of the patient and in coordination of his care with CCM      LOS: 7 days   Alcide Evener 11/18/2020, 6:09 PM

## 2020-11-18 NOTE — Progress Notes (Signed)
ANTICOAGULATION CONSULT NOTE   Pharmacy Consult for Heparin Indication: DVT  No Known Allergies  Patient Measurements: Height: 5\' 10"  (177.8 cm) Weight: 75.9 kg (167 lb 5.3 oz) IBW/kg (Calculated) : 73 Heparin Dosing Weight: 76 kg  Vital Signs: Temp: 97.9 F (36.6 C) (05/04 0453) Temp Source: Oral (05/04 0453) BP: 153/79 (05/04 0453) Pulse Rate: 90 (05/04 0453)  Labs: Recent Labs    11/16/20 0452 11/16/20 2156 11/17/20 0442 11/18/20 0220 11/18/20 1050  HGB 12.0*  --  11.7* 11.1*  --   HCT 36.9*  --  36.3* 34.2*  --   PLT 214  --  276 237  --   HEPARINUNFRC  --  <0.10*  --  0.43 0.23*  CREATININE 0.57*  --  0.60* 0.58*  --    Estimated Creatinine Clearance: 79.8 mL/min (A) (by C-G formula based on SCr of 0.58 mg/dL (L)).  Medications:  Scheduled:  . atorvastatin  10 mg Oral Daily  . busPIRone  7.5 mg Oral BID  . Chlorhexidine Gluconate Cloth  6 each Topical Daily  . dextromethorphan-guaiFENesin  1 tablet Oral BID  . famotidine  20 mg Oral Daily  . Ferrous Fumarate  1 tablet Oral Daily  . insulin aspart  0-9 Units Subcutaneous TID AC & HS  . ipratropium-albuterol  3 mL Nebulization TID  . irbesartan  37.5 mg Oral Daily  . pantoprazole  40 mg Oral BID AC  . pregabalin  75 mg Oral BID  . sucralfate  1 g Oral TID WC & HS  . tamsulosin  0.8 mg Oral Daily  . venlafaxine XR  150 mg Oral Q breakfast   Infusions:  . ceFEPime (MAXIPIME) IV 2 g (11/18/20 0602)  . heparin 1,550 Units/hr (11/17/20 1838)  . lactated ringers 10 mL/hr at 11/17/20 1106  . vancomycin 1,000 mg (11/18/20 0912)    Assessment: 13 yoM with vertebral osteo, R PICC for IV abx PTA, now with RUE DVT.  Was on Lovenox 40mg  SQ qd for VTE prophylaxis, last dose today 5/2 at 10am. PICC line removed, single L hand IV site, will need another IV site for Abx. Heparin started at 1300 units/hr, no bolus with 5/2 am Lovenox dose given First Heparin level < 0.1, rate was incr to 1550 units/hr, then Heparin  stopped 5/3 am at 0730 for Bronch  - Metronidazole may increase Heparin level -Bronch completed, per CCM (Dr Ander Slade) can resume Heparin 5/3 after 1600, some oozing noted with Bronch  11/18/2020 0220 HL 0.43 therapeutic on 1550 units/hr Hgb 11.1, stable, plts WNL, No bleeding noted Rpt confirmatory HL low at 0.23 units/ml, Heparin infusing in L hand, sample drawn from L antecubital.  Unable to use R arm with RUE DVT.   Goal of Therapy:  Heparin level 0.3-0.7 units/ml Monitor platelets by anticoagulation protocol: Yes   Plan:  Heparin bolus 1000 units Increase Heparin drip to 1650 units/hr Check confirmatory level in 8 hours Daily CBC, plan daily Heparin level at steady state Best case would be transition to DOAC  Minda Ditto PharmD 11/18/2020, 12:11 PM

## 2020-11-18 NOTE — Progress Notes (Signed)
NAME:  Luis Brown, MRN:  [id number], DOB:  1946/04/26, LOS: 7 ADMISSION DATE:  11/10/2020, CONSULTATION DATE:  11/16/2020 REFERRING MD:  Dr Cleophas Dunker, CHIEF COMPLAINT: Pneumonia  History of Present Illness:  75 y/o M admitted 4/26 with several week hx of worsening SOB.    He was home on antibiotics (daptomycin) for osteomyelitis. The patient was noted to have shortness of breath, diaphoresis & hypoxemic in the office and was referred to the emergency department for evaluation.  He was admitted per [facility] and felt as if his SOB became worse after admission.  He was started on empiric antibiotics for pneumonia. However, his breathing continued to worsen, had a repeat CT scan showing progressive infiltration in the right lung.    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 4/26 Admit  . 5/03 FOB . 5/04 O2 at 6L, pt reports improvement in dyspnea related anxiety with xanax   Interim History / Subjective:  Pt reports xanax helps with anxiety of not being able to breath O2 weaned to 6L   Objective   Blood pressure (!) 153/79, pulse 90, temperature 97.9 F (36.6 C), temperature source Oral, resp. rate 19, height _0  (1.778 m), weight 75.9 kg, SpO2 94 %.    FiO2 (%):  [100 %] 100 %   Intake/Output Summary (Last 24 hours) at 11/18/2020 1241 Last data filed at 11/18/2020 0604 Gross per 24 hour  Intake 460.11 ml  Output 950 ml  Net -489.89 ml   Filed Weights   11/10/20 2230  Weight: 75.9 kg    Examination: General: elderly adult male sitting up in chair in NAD HEENT: MM pink/moist, [place] O2, anicteric  Neuro: AAOx4, speech  clear, MAE CV: s1s2 RRR, no m/r/g PULM: non-labored on [place] O2, clear on left, crackles on right  GI: soft, bsx4 active, tolerating PO's  Extremities: warm/dry, no edema  Skin: no rashes or lesions   Labs/imaging that I havepersonally reviewed  (right click and "Reselect all SmartList Selections" daily)  Prior CT imaging O2 needs  Prior CBC, BMP Bronchoscopy findings - negative pathology / washings, 72 TNC on BAL   Resolved Hospital Problem list     Assessment & Plan:   Progressive Right Sided Pulmonary Infiltrates  Pleural effusion Pulmonary Nodules (48m, 766m2021 imaging) He was on previously on daptomycin for hardware infection via PICC line.  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  S/p FOB on 5/3 with negative pathology, eosinophils not completed. Note hx of prior pulmonary nodules and tobacco abuse.  No subjective hx of aspiration, difficulty swallowing or choking. Differential includes eosinophilic PNA in setting of dapto use, aspiration with PNA/CAP, less likely TB, must also consider malignancy with smoking hx and nodules.  -follow up cultures -pulmonary hygiene - IS, mobilize  -transition to bactrim per ID, will offer additional PJP prophylaxis if on high dose steroids  -consider steroids of 40 mg QD and continue x2 weeks post resolution of symptoms, defer to MD -follow up CT imaging as outpatient  -wean O2 for sats >90% -follow intermittent CXR   Upper extremity DVT  -defer anticoagulation to primary   Subacute osteomyelitis  of the thoracic spine BPH Hyperlipidemia -per primary    Noe Gens, MSN, APRN, NP-C, AGACNP-BC [facility] 11/18/2020, 3:02 PM   Please see [email or web address] for pager details.   From 7A-7P if no response, please call [phone] After hours, please call ELink [phone]

## 2020-11-19 DIAGNOSIS — E119 Type 2 diabetes mellitus without complications: Secondary | ICD-10-CM | POA: Diagnosis not present

## 2020-11-19 DIAGNOSIS — E871 Hypo-osmolality and hyponatremia: Secondary | ICD-10-CM | POA: Diagnosis not present

## 2020-11-19 DIAGNOSIS — R0902 Hypoxemia: Secondary | ICD-10-CM

## 2020-11-19 DIAGNOSIS — N400 Enlarged prostate without lower urinary tract symptoms: Secondary | ICD-10-CM | POA: Diagnosis not present

## 2020-11-19 DIAGNOSIS — J189 Pneumonia, unspecified organism: Secondary | ICD-10-CM | POA: Diagnosis not present

## 2020-11-19 DIAGNOSIS — T888XXD Other specified complications of surgical and medical care, not elsewhere classified, subsequent encounter: Secondary | ICD-10-CM | POA: Diagnosis not present

## 2020-11-19 DIAGNOSIS — J9601 Acute respiratory failure with hypoxia: Secondary | ICD-10-CM | POA: Diagnosis not present

## 2020-11-19 LAB — BASIC METABOLIC PANEL
Anion gap: 12 (ref 5–15)
BUN: 19 mg/dL (ref 8–23)
CO2: 22 mmol/L (ref 22–32)
Calcium: 8.4 mg/dL — ABNORMAL LOW (ref 8.9–10.3)
Chloride: 100 mmol/L (ref 98–111)
Creatinine, Ser: 0.54 mg/dL — ABNORMAL LOW (ref 0.61–1.24)
GFR, Estimated: >60 mL/min (ref >60)
Glucose, Bld: 271 mg/dL — ABNORMAL HIGH (ref 70–99)
Potassium: 4.6 mmol/L (ref 3.5–5.1)
Sodium: 134 mmol/L — ABNORMAL LOW (ref 135–145)

## 2020-11-19 LAB — GLUCOSE 6 PHOSPHATE DEHYDROGENASE
G6PDH: 10.5 U/g{Hb} (ref 4.8–15.7)
Hemoglobin: 12.8 g/dL — ABNORMAL LOW (ref 13.0–17.7)

## 2020-11-19 LAB — CBC WITH DIFFERENTIAL/PLATELET
Abs Immature Granulocytes: 0.32 10*3/uL — ABNORMAL HIGH (ref 0.00–0.07)
Basophils Absolute: 0 10*3/uL (ref 0.0–0.1)
Basophils Relative: 0 %
Eosinophils Absolute: 0 10*3/uL (ref 0.0–0.5)
Eosinophils Relative: 0 %
HCT: 37.8 % — ABNORMAL LOW (ref 39.0–52.0)
Hemoglobin: 12.2 g/dL — ABNORMAL LOW (ref 13.0–17.0)
Immature Granulocytes: 3 %
Lymphocytes Relative: 6 %
Lymphs Abs: 0.7 10*3/uL (ref 0.7–4.0)
MCH: 29.8 pg (ref 26.0–34.0)
MCHC: 32.3 g/dL (ref 30.0–36.0)
MCV: 92.4 fL (ref 80.0–100.0)
Monocytes Absolute: 0.1 10*3/uL (ref 0.1–1.0)
Monocytes Relative: 1 %
Neutro Abs: 10.4 10*3/uL — ABNORMAL HIGH (ref 1.7–7.7)
Neutrophils Relative %: 90 %
Platelets: 296 10*3/uL (ref 150–400)
RBC: 4.09 MIL/uL — ABNORMAL LOW (ref 4.22–5.81)
RDW: 14.1 % (ref 11.5–15.5)
WBC: 11.5 10*3/uL — ABNORMAL HIGH (ref 4.0–10.5)
nRBC: 0 % (ref 0.0–0.2)

## 2020-11-19 LAB — CULTURE, RESPIRATORY W GRAM STAIN: Culture: NO GROWTH

## 2020-11-19 LAB — MAGNESIUM: Magnesium: 2.1 mg/dL (ref 1.7–2.4)

## 2020-11-19 LAB — RHEUMATOID FACTOR: Rheumatoid fact SerPl-aCnc: <10 IU/mL (ref <14.0)

## 2020-11-19 LAB — GLOMERULAR BASEMENT MEMBRANE ANTIBODIES: GBM Ab: 5 units (ref 0–20)

## 2020-11-19 LAB — ACID FAST SMEAR (AFB, MYCOBACTERIA): Acid Fast Smear: NEGATIVE

## 2020-11-19 LAB — GLUCOSE, CAPILLARY
Glucose-Capillary: 199 mg/dL — ABNORMAL HIGH (ref 70–99)
Glucose-Capillary: 219 mg/dL — ABNORMAL HIGH (ref 70–99)
Glucose-Capillary: 264 mg/dL — ABNORMAL HIGH (ref 70–99)
Glucose-Capillary: 272 mg/dL — ABNORMAL HIGH (ref 70–99)
Glucose-Capillary: 276 mg/dL — ABNORMAL HIGH (ref 70–99)
Glucose-Capillary: 319 mg/dL — ABNORMAL HIGH (ref 70–99)
Glucose-Capillary: 321 mg/dL — ABNORMAL HIGH (ref 70–99)

## 2020-11-19 LAB — HEPARIN LEVEL (UNFRACTIONATED): Heparin Unfractionated: 0.66 IU/mL (ref 0.30–0.70)

## 2020-11-19 LAB — SJOGRENS SYNDROME-B EXTRACTABLE NUCLEAR ANTIBODY: SSB (La) (ENA) Antibody, IgG: <0.2 AI (ref 0.0–0.9)

## 2020-11-19 LAB — ANTI-DNA ANTIBODY, DOUBLE-STRANDED: ds DNA Ab: <1 IU/mL (ref 0–9)

## 2020-11-19 LAB — ANTI-SCLERODERMA ANTIBODY: Scleroderma (Scl-70) (ENA) Antibody, IgG: <0.2 AI (ref 0.0–0.9)

## 2020-11-19 LAB — SJOGRENS SYNDROME-A EXTRACTABLE NUCLEAR ANTIBODY: SSA (Ro) (ENA) Antibody, IgG: <0.2 AI (ref 0.0–0.9)

## 2020-11-19 MED ORDER — RIVAROXABAN 20 MG PO TABS: 20.0000 mg | ORAL_TABLET | Freq: Every day | ORAL | Status: DC

## 2020-11-19 MED ORDER — RIVAROXABAN 15 MG PO TABS
15.0000 mg | ORAL_TABLET | Freq: Two times a day (BID) | ORAL | Status: DC
  Administered 2020-11-19 – 2020-11-23 (×8): 15 mg via ORAL
  Filled 2020-11-19 (×8): qty 1

## 2020-11-19 NOTE — Progress Notes (Signed)
PCCM Progress Note   Chart reviewed and plan of care discussed with primary MD, patient remains stable. IV steroids started 5/4. No acute pulmonary needs identified today. PCCM will formally see patient again 5/5.   Please contact if we can be of further assistance is needed before  Johnsie Cancel, NP-C  Pulmonary & Critical Care Personal contact information can be found on Amion  11/19/2020, 6:09 PM

## 2020-11-19 NOTE — Discharge Instructions (Addendum)

## 2020-11-19 NOTE — Progress Notes (Addendum)
[facility] [place] for Heparin >> Xarelto Indication: DVT  No Known Allergies  Patient Measurements: Height: 5\' 10"  (177.8 cm) Weight: 75.9 kg (167 lb 5.3 oz) IBW/kg (Calculated) : 73 Heparin Dosing Weight: 76 kg  Vital Signs: Temp: 97.8 F (36.6 C) (05/05 0400) Temp Source: Oral (05/05 0400) BP: 116/64 (05/05 0400) Pulse Rate: 60 (05/05 0400)  Labs: Recent Labs    11/17/20 0442 11/18/20 0220 11/18/20 1050 11/18/20 2047 11/19/20 0450  HGB 11.7* 11.1*  --   --  12.2*  HCT 36.3* 34.2*  --   --  37.8*  PLT 276 237  --   --  296  HEPARINUNFRC  --  0.43 0.23* 0.32 0.66  CREATININE 0.60* 0.58*  --   --  0.54*   Estimated Creatinine Clearance: 79.8 mL/min (A) (by C-G formula based on SCr of 0.54 mg/dL (L)).  Assessment: 38 yoM with vertebral osteo, R PICC for IV abx PTA, admitted 4/26 for pneumonia, now with line-associated DVT noted 5/2. Pharmacy consulted to dose IV heparin. Underwent bronchoscopy on 5/3 with some minor oozing noted afterwards.  Today, 11/19/2020:  Daily heparin level therapeutic but on higher end of goal range; up significantly despite minimal rate increase  Hgb slightly low but stable; Plt stable WNL  SCr stable WNL  No bleeding or infusion issues per RN - no reported hemoptysis or other continued bleeding after bronch  Goal of Therapy:  Heparin level 0.3-0.7 units/ml Monitor platelets by anticoagulation protocol: Yes   Plan:   Reduce heparin slightly to 1600 units/hr  Daily CBC and daily Heparin level  F/u plans for transition to [place], PharmD, BCPS [phone] 11/19/2020, 7:52 AM    ADDENDUM  OK for transition to [place] today per MD  Xarelto most inexpensive per CM  Plan:  At 5p tonight (supper), start Xarelto 15 mg PO bid x 21d, followed by 20 mg PO daily thereafter  Stop heparin with first dose of Xarelto  Pharmacy to provide Xarelto education and coupons prior to discharge  Reuel Boom, PharmD, [place] 11/19/2020, 12:14 PM

## 2020-11-19 NOTE — TOC Benefit Eligibility Note (Signed)
Transition of Care [facility]) Benefit Eligibility Note    Patient Details  Name: Luis Brown MRN: [id number] Date of Birth: [birth year]   Medication/Dose: Alveda Reasons $RemoveBefore'20mg'CuTtpVJfCrkXo$  po QD x 30 days  Covered?: Yes  Tier: 3 Drug  Prescription Coverage Preferred Pharmacy: local  Spoke with Person/Company/Phone Number:: Chris/ [employer] [phone]  Co-Pay: $27.62  Prior Approval: No  Deductible: Met       Kerin Salen Phone Number: 11/19/2020, 11:26 AM

## 2020-11-19 NOTE — TOC Initial Note (Signed)
Transition of Care [facility]) - Initial/Assessment Note    Patient Details  Name: Luis Brown MRN: [id number] Date of Birth: Oct 29, 1945  Transition of Care [facility]) CM/SW Contact:    Dessa Phi, RN Phone Number: 11/19/2020, 12:10 PM  Clinical Narrative:Spoke to spouse [name] about d/c plans-home. PT recc-otpt PT-Caroline [facility] [address]-will put in referral-they can accept with 02-must have own tank.If home 02 needed can arrange with documented qualified 02 sats, & home 02 order.Benefit check done see prior benefit check note on xarelto $27.62-Caroline informed.Family has own transport home.                  Expected Discharge Plan: OP Rehab Barriers to Discharge: Continued Medical Work up   Patient Goals and CMS Choice Patient states their goals for this hospitalization and ongoing recovery are:: go home CMS Medicare.gov Compare Post Acute Care list provided to:: Patient Represenative (must comment) [name] spouse [phone]) Choice offered to / list presented to : Spouse  Expected Discharge Plan and Services Expected Discharge Plan: OP Rehab   Discharge Planning Services: CM Consult Post Acute Care Choice:  (otpt PT) Living arrangements for the past 2 months: Single Family Home                                      Prior Living Arrangements/Services Living arrangements for the past 2 months: Single Family Home Lives with:: Spouse Patient language and need for interpreter reviewed:: Yes Do you feel safe going back to the place where you live?: Yes      Need for Family Participation in Patient Care: No (Comment) Care giver support system in place?: Yes (comment) Current home services: DME (rw) Criminal Activity/Legal Involvement Pertinent to Current Situation/Hospitalization: No - Comment as needed  Activities of Daily Living Home Assistive Devices/Equipment: None ADL Screening (condition at time of admission) Patient's  cognitive ability adequate to safely complete daily activities?: Yes Is the patient deaf or have difficulty hearing?: No Does the patient have difficulty seeing, even when wearing glasses/contacts?: No Does the patient have difficulty concentrating, remembering, or making decisions?: No Patient able to express need for assistance with ADLs?: Yes Does the patient have difficulty dressing or bathing?: No Independently performs ADLs?: Yes (appropriate for developmental age) Does the patient have difficulty walking or climbing stairs?: No Weakness of Legs: None Weakness of Arms/Hands: None  Permission Sought/Granted Permission sought to share information with : Case Manager Permission granted to share information with : Yes, Verbal Permission Granted  Share Information with NAME: Case Manager     Permission granted to share info w Relationship: [name] spouse [phone]     Emotional Assessment Appearance:: Appears stated age Attitude/Demeanor/Rapport: Gracious Affect (typically observed): Accepting Orientation: : Oriented to Self,Oriented to Place,Oriented to  Time,Oriented to Situation Alcohol / Substance Use: Not Applicable Psych Involvement: No (comment)  Admission diagnosis:  Hypoxia [R09.02] HCAP (healthcare-associated pneumonia) [J18.9] Pneumonia of right lower lobe due to infectious organism [J18.9] CAP (community acquired pneumonia) [J18.9] Patient Active Problem List   Diagnosis Date Noted  . SOB (shortness of breath)   . 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 DVT (deep venous thrombosis) ([place]) 11/16/2020  . Acute respiratory failure with hypoxia ([place]) 11/14/2020  . Fluid collection at surgical site   . Hypoxia   . Wound infection   . Sepsis ([place]) 11/11/2020  .  HCAP (healthcare-associated pneumonia) 11/10/2020  . S/P bronchoscopy 09/14/2020  . Hyperglycemia 03/22/2020  . Gastric ulcer 01/27/2020  . H/O colonoscopy with polypectomy 01/27/2020  . Protein-calorie  malnutrition, severe 01/09/2020  . Weight loss 01/07/2020  . Abdominal pain 12/30/2019  . Nausea and vomiting 12/30/2019  . Weight loss, abnormal 12/30/2019  . Change in bowel habits 12/30/2019  . 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 Medication monitoring encounter 08/26/2019  . Peripheral neuropathy 08/07/2019  . Alcohol abuse 05/09/2019  . 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[place]) 04/04/2019  . Fusion of spine of thoracolumbar region 03/12/2019  . Status post thoracic spinal fusion 02/12/2019  . 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 Thoracic spinal stenosis 04/17/2017  . Painful orthopaedic hardware ([place]) 04/17/2017  . 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 ([place]) 04/17/2017  . Low back pain radiating to both legs 01/16/2017  . Esophageal reflux 02/10/2015  . Medicare annual wellness visit, subsequent 02/10/2015  . Personal history of colonic polyps 10/27/2012  . 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  . BPH (benign prostatic hyperplasia) 04/23/2012  . ED (erectile dysfunction) 04/23/2012  . Allergic state 10/25/2011  . Anxiety and depression 10/25/2011  . Asthma   . Diabetes mellitus without complication ([place])   . Hyperlipidemia   . Overweight   . Insomnia   . Fatigue   . Preventative health care   . DDD (degenerative disc disease), lumbosacral   . Thoracic degenerative disc disease   . HTN (hypertension)    PCP:  Mackie Pai, PA-C Pharmacy:   [id number] [place], [place] - [address] [place] [address] Phone: [phone] Fax: [phone]  [id number] - [place], [place] - [address] [place] [address] Phone: [phone] Fax: [place] [address] [place] [address] [place] [address] Phone: [phone] Fax: [phone]     Social Determinants of  Health ([place]) Interventions    Readmission Risk Interventions No flowsheet data found.

## 2020-11-19 NOTE — Progress Notes (Signed)
Physical Therapy Treatment Patient Details Name: Luis Brown MRN: [id number] DOB: [birth year] Today's Date: 11/19/2020    History of Present Illness Pt is 75 yo male who presented to ED with hypoxia on 11/10/20 and admitted with atypical pnemonia.  Additionally, pt had MRI T spine and found to have subcutaneous seroma and is s/p aspiration on 11/13/20.  He has PMH including recent vertebral osteomyelitis requiring removal of hardware on 09/14/20 still on antibiotics, peripheral neuropathy, HLC, HTN, DM, DDD, BPH, anxiety and depression.    PT Comments    Pt with slow progress.  Ambulated 15', 25'x2 but required 6-8 L of O2.  Requiring min A to steady with slow gait.  Would benefit from AD use for stability and energy conservation.  Pt remains limited by resp status and decreased O2 sats.  Not progressing as well as initially expected on eval, so updated recommendation from outpt PT to [place].     Follow Up Recommendations  Home health PT     Equipment Recommendations   (rollator -says has access to one)    Recommendations for Other Services       Precautions / Restrictions Precautions Precautions: Fall Precaution Comments: monitor O2    Mobility  Bed Mobility Overal bed mobility: Needs Assistance Bed Mobility: Supine to Sit;Sit to Supine     Supine to sit: Supervision Sit to supine: Supervision   General bed mobility comments: for lines    Transfers Overall transfer level: Needs assistance Equipment used: None Transfers: Sit to/from Stand Sit to Stand: Min assist         General transfer comment: Min A to steady  Ambulation/Gait Ambulation/Gait assistance: Min assist Gait Distance (Feet): 25 Feet (10', 25', 25') Assistive device: IV Pole Gait Pattern/deviations: Step-to pattern;Decreased stride length Gait velocity: decreased   General Gait Details: Ambulated 3 bouts wiht standing rest breaks.  Started with ambulation to bathroom without RW but then pt continued  so did not add RW.  Would benefit from RW or rollator for stability and energy conservation. Limited by shortness of breath. See general comments for VS.   Stairs             Wheelchair Mobility    Modified Rankin (Stroke Patients Only)       Balance Overall balance assessment: Needs assistance   Sitting balance-Leahy Scale: Normal     Standing balance support: Single extremity supported Standing balance-Leahy Scale: Poor Standing balance comment: requiring single UE support and min guard                            Cognition Arousal/Alertness: Awake/alert Behavior During Therapy: WFL for tasks assessed/performed Overall Cognitive Status: Within Functional Limits for tasks assessed                                        Exercises Other Exercises Other Exercises: IS x 5 up to 1250 mL    General Comments General comments (skin integrity, edema, etc.): Pt on 5 L O2 at rest with sats 93%. Placed on 6 L initially for activity with sats 85% ; however, on 2nd bout of ambulation' dropped to 78%. Increased to 8L for remainder of walk with sats 85%. Once back at rest gradually weaned by to 5L with sats 90%. Taking at least 5 mins to wean back to 5 L. Pt educated  on pursed lip deep breathing throughout session.  Also, encouraged resp rate as needed      Pertinent Vitals/Pain Pain Assessment: No/denies pain    Home Living                      Prior Function            PT Goals (current goals can now be found in the care plan section) Acute Rehab PT Goals Patient Stated Goal: return home; get back to normal and off O2 PT Goal Formulation: With patient/family Time For Goal Achievement: 11/27/20 Potential to Achieve Goals: Good Progress towards PT goals: Progressing toward goals    Frequency    Min 3X/week      PT Plan Discharge plan needs to be updated    Co-evaluation              AM-PAC PT "6 Clicks" Mobility    Outcome Measure  Help needed turning from your back to your side while in a flat bed without using bedrails?: None Help needed moving from lying on your back to sitting on the side of a flat bed without using bedrails?: None Help needed moving to and from a bed to a chair (including a wheelchair)?: A Little Help needed standing up from a chair using your arms (e.g., wheelchair or bedside chair)?: A Little Help needed to walk in hospital room?: A Little Help needed climbing 3-5 steps with a railing? : A Little 6 Click Score: 20    End of Session Equipment Utilized During Treatment: Oxygen Activity Tolerance: Patient limited by fatigue Patient left: in bed;with call bell/phone within reach;with family/visitor present;with bed alarm set Nurse Communication: Mobility status PT Visit Diagnosis: Other abnormalities of gait and mobility (R26.89);Unsteadiness on feet (R26.81)     Time: 1761-6073 PT Time Calculation (min) (ACUTE ONLY): 29 min  Charges:  $Gait Training: 8-22 mins $Therapeutic Activity: 8-22 mins                     Abran Richard, PT Acute Rehab Services Pager [phone] [facility] [place] 11/19/2020, 3:49 PM

## 2020-11-19 NOTE — Progress Notes (Signed)
Progress Note  Luis Brown   RSW:[id number]  DOB: 28-Apr-1946  DOA: 11/10/2020     8  PCP: Elise Benne  CC: SOB  Hospital Course: Luis Brown is a 75 yo male with PMH vertebral OM (was on 6 wk Dapto course PTA), peripheral neuropathy, HLD, HTN, DMII, DDD, BPH, anxiety/depression who presented to the hospital with SOB.  He initially presented to his PCPs office and was found to be hypoxic.  He was referred to the ER.  He underwent work-up with CTA chest which showed a large right sided PNA with ground glass nodules and concern for central cavitation.  He was started on IV antibiotics on admission.  Infectious disease was consulted given prolonged antibiotic course at home and now with atypical pneumonia.  Blood cultures were obtained on admission.  Repeat MRI T-spine and echo were also ordered. The echo was negative for valvular vegetation.  MRI was negative for deep infection but did reveal a subcutaneous probable seroma.  IR was consulted per ID for aspiration/drainage and culturing of fluid.  He underwent aspiration on 11/13/2020.  Fluid studies were sent for culture and did not grow (appearance was also non-infectious).  He had some swelling and irritation associated with his PICC line.  He underwent right upper extremity duplex which was positive for DVT.  He was started on heparin drip on 11/16/2020 and PICC line was removed. Due to worsening respiratory symptoms, increasing oxygen requirements, he underwent repeat CTA chest.  This showed worsening right-sided pneumonia.  Pulmonology was consulted and he also underwent bronchoscopy on 11/17/2020.   Review of systems/interval History:  No acute issues or events overnight, wife at bedside - patient feels minimally better but not yet back to baseline. Looking forward to working with PT today   Assessment & Plan:  CAP (community acquired pneumonia), resolving - CAP with higher risk MDRO due to prolonged IV abx (dapto) and recent  exposure to healthcare - Completed 10 days of abx w/  - CTA read on 4/26 suggests right sided cavitary lesions - Follow up blood cultures remain negative - MRI T-spine shows probable seroma; IR consulted per ID - aspiration/drainage on 11/13/20 (3cc drained, non infected appearing)  - Repeat CTA on 5/1 shows worsening right sided PNA, small effusion, no PE - Pulmonology consulted 5/2. Patient underwent bronch 5/3. Follow up bronch studies fairly unremarkable  DVT (deep venous thrombosis) of the upper extremity ([place]) - RUE pain on 11/16/2020 - US shows acute DVT noted in right axillary and brachial veins - PICC removed  - Heparin transition to xarelto on 11/19/20  Acute respiratory failure with hypoxia ([place]) - New as of 4/30.  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 Seen by ID on 09/28/20 (prior to this had also been on a prolonged abx course for prior infection). Plan was: 6 weeks Dapto followed by Bactrim up to 3 months if tolerates - s ID following; continue on Vanc/cefepime for now while in hospital  - repeat MRI  T-spine 11/11/20: no epidural abscess. New fluid collection in SQ tissue T3-4 to T6-7 measuring 6 cm, considered a seroma (to be aspirated for culture on 4/29) -Will transition vancomycin to Bactrim once pneumonia further treated  BPH (benign prostatic hyperplasia) - Continue Flomax  Hyperlipidemia - Continue Lipitor  Diabetes mellitus  without complication ([place]) - continue SSI and CBG monitoring  - last A1c 6.3% on 09/08/20  HTN (hypertension) - Continue Avapro  Hyponatremia-resolved as of 11/11/2020 - Na 130 on admission; suspected hypovolemia - improved  Antimicrobials: Azithromycin 11/11/20 x 1 Vancomycin 11/10/2020 >> 11/19/20 Cefepime 11/10/2020 >> 11/19/20  Flagyl 11/11/2020 >> 11/17/20 Bactrim 11/18/20 - ongoing  DVT prophylaxis:  Code Status:   Code Status: Full Code Family Communication: wife at bedside  Disposition Plan: Status is: Inpatient  Remains inpatient appropriate because:Ongoing diagnostic testing needed not appropriate for outpatient work up, IV treatments appropriate due to intensity of illness or inability to take PO and Inpatient level of care appropriate due to severity of illness   Dispo: The patient is from: Home              Anticipated d/c is to: Home 48-72h              Patient currently is not medically stable to d/c.   Difficult to place patient No  Risk of unplanned readmission score: Unplanned Admission- Pilot do not use: 21.73   Objective: Blood pressure 116/64, pulse 60, temperature 97.8 F (36.6 C), temperature source Oral, resp. rate 20, height _0  (1.778 m), weight 75.9 kg, SpO2 97 %.  Examination: General appearance: Pleasant but anxious adult man resting in bed appearing comfortable and in no distress.  Does appear more fatigued and is on 6 L oxygen Head: Normocephalic, without obvious abnormality, atraumatic Eyes: EOMI Lungs: Right-sided coarse breath sounds with rhonchi.  No wheezing.  Heart: regular rate and rhythm and S1, S2 normal Abdomen: normal findings: bowel sounds normal and soft, non-tender Extremities: extremities normal, atraumatic, no cyanosis or edema Skin: mobility and turgor normal Neurologic: Grossly normal  Consultants:   ID  IR  Pulmonology  Procedures:   Thoracic fluid collection drainage with IR: 11/13/2020  Data Reviewed: I have  personally reviewed following labs and imaging studies Results for orders placed or performed during the hospital encounter of 11/10/20 (from the past 24 hour(s))  Glucose, capillary     Status: Abnormal   Collection Time: 11/18/20  7:51 AM  Result Value Ref Range   Glucose-Capillary 178 (H) 70 - 99 mg/dL  Heparin level (unfractionated)     Status: Abnormal   Collection Time: 11/18/20 10:50 AM  Result Value Ref Range   Heparin Unfractionated 0.23 (L) 0.30 - 0.70 IU/mL  Glucose, capillary     Status: Abnormal   Collection Time: 11/18/20 11:48 AM  Result Value Ref Range   Glucose-Capillary 245 (H) 70 - 99 mg/dL  Sedimentation rate     Status: None   Collection Time: 11/18/20  4:08 PM  Result Value Ref Range   Sed Rate 9 0 - 16 mm/hr  Hemoglobin A1c     Status: Abnormal   Collection Time: 11/18/20  4:10 PM  Result Value Ref Range   Hgb A1c MFr Bld 6.9 (H) 4.8 - 5.6 %   Mean Plasma Glucose 151.33 mg/dL  Glucose, capillary     Status: Abnormal   Collection Time: 11/18/20  4:44 PM  Result Value Ref Range   Glucose-Capillary 157 (H) 70 - 99 mg/dL  Glucose, capillary     Status: Abnormal   Collection Time: 11/18/20  8:37 PM  Result Value Ref Range   Glucose-Capillary 165 (H) 70 - 99 mg/dL  Heparin level (unfractionated)     Status: None   Collection Time: 11/18/20  8:47 PM  Result Value Ref Range   Heparin Unfractionated 0.32 0.30 - 0.70 IU/mL  Glucose, capillary     Status: Abnormal   Collection Time: 11/19/20 12:26 AM  Result Value Ref Range   Glucose-Capillary 219 (H) 70 - 99 mg/dL  Glucose, capillary     Status: Abnormal   Collection Time: 11/19/20  4:43 AM  Result Value Ref Range   Glucose-Capillary 276 (H) 70 - 99 mg/dL  Basic metabolic panel     Status: Abnormal   Collection Time: 11/19/20  4:50 AM  Result Value Ref Range   Sodium 134 (L) 135 - 145 mmol/L   Potassium 4.6 3.5 - 5.1 mmol/L   Chloride 100 98 - 111 mmol/L   CO2 22 22 - 32 mmol/L   Glucose, Bld 271 (H) 70  - 99 mg/dL   BUN 19 8 - 23 mg/dL   Creatinine, Ser 0.54 (L) 0.61 - 1.24 mg/dL   Calcium 8.4 (L) 8.9 - 10.3 mg/dL   GFR, Estimated >60 >60 mL/min   Anion gap 12 5 - 15  CBC with Differential/Platelet     Status: Abnormal   Collection Time: 11/19/20  4:50 AM  Result Value Ref Range   WBC 11.5 (H) 4.0 - 10.5 K/uL   RBC 4.09 (L) 4.22 - 5.81 MIL/uL   Hemoglobin 12.2 (L) 13.0 - 17.0 g/dL   HCT 37.8 (L) 39.0 - 52.0 %   MCV 92.4 80.0 - 100.0 fL   MCH 29.8 26.0 - 34.0 pg   MCHC 32.3 30.0 - 36.0 g/dL   RDW 14.1 11.5 - 15.5 %   Platelets 296 150 - 400 K/uL   nRBC 0.0 0.0 - 0.2 %   Neutrophils Relative % 90 %   Neutro Abs 10.4 (H) 1.7 - 7.7 K/uL   Lymphocytes Relative 6 %   Lymphs Abs 0.7 0.7 - 4.0 K/uL   Monocytes Relative 1 %   Monocytes Absolute 0.1 0.1 - 1.0 K/uL   Eosinophils Relative 0 %   Eosinophils Absolute 0.0 0.0 - 0.5 K/uL   Basophils Relative 0 %   Basophils Absolute 0.0 0.0 - 0.1 K/uL   Immature Granulocytes 3 %   Abs Immature Granulocytes 0.32 (H) 0.00 - 0.07 K/uL  Magnesium     Status: None   Collection Time: 11/19/20  4:50 AM  Result Value Ref Range   Magnesium 2.1 1.7 - 2.4 mg/dL  Heparin level (unfractionated)     Status: None   Collection Time: 11/19/20  4:50 AM  Result Value Ref Range   Heparin Unfractionated 0.66 0.30 - 0.70 IU/mL   *Note: Due to a large number of results and/or encounters for the requested time period, some results have not been displayed. A complete set of results can be found in Results Review.    Recent Results (from the past 240 hour(s))  Resp Panel by RT-PCR (Flu A&B, Covid) Nasopharyngeal Swab     Status: None   Collection Time: 11/10/20  4:54 PM   Specimen: Nasopharyngeal Swab; Nasopharyngeal(NP) swabs in vial transport medium  Result Value Ref Range Status   SARS Coronavirus 2 by RT PCR NEGATIVE NEGATIVE Final    Comment: (NOTE) SARS-CoV-2 target nucleic acids are  NOT DETECTED.  The SARS-CoV-2 RNA is generally detectable in upper  respiratory specimens during the acute phase of infection. The lowest concentration of SARS-CoV-2 viral copies this assay can detect is 138 copies/mL. A negative result does not preclude SARS-Cov-2 infection and should not be used as the sole basis for treatment or other patient management decisions. A negative result may occur with  improper specimen collection/handling, submission of specimen other than nasopharyngeal swab, presence of viral mutation(s) within the areas targeted by this assay, and inadequate number of viral copies(<138 copies/mL). A negative result must be combined with clinical observations, patient history, and epidemiological information. The expected result is Negative.  Fact Sheet for Patients:  EntrepreneurPulse.com.au  Fact Sheet for Healthcare Providers:  IncredibleEmployment.be  This test is no t yet approved or cleared by the [place] FDA and  has been authorized for detection and/or diagnosis of SARS-CoV-2 by FDA under an Emergency Use Authorization (EUA). This EUA will remain  in effect (meaning this test can be used) for the duration of the COVID-19 declaration under Section 564(b)(1) of the Act, 21 U.S.C.section 360bbb-3(b)(1), unless the authorization is terminated  or revoked sooner.       Influenza A by PCR NEGATIVE NEGATIVE Final   Influenza B by PCR NEGATIVE NEGATIVE Final    Comment: (NOTE) The Xpert Xpress SARS-CoV-2/FLU/RSV plus assay is intended as an aid in the diagnosis of influenza from Nasopharyngeal swab specimens and should not be used as a sole basis for treatment. Nasal washings and aspirates are unacceptable for Xpert Xpress SARS-CoV-2/FLU/RSV testing.  Fact Sheet for Patients: EntrepreneurPulse.com.au  Fact Sheet for Healthcare Providers: IncredibleEmployment.be  This test is not yet approved or cleared by the [place] FDA and has been  authorized for detection and/or diagnosis of SARS-CoV-2 by FDA under an Emergency Use Authorization (EUA). This EUA will remain in effect (meaning this test can be used) for the duration of the COVID-19 declaration under Section 564(b)(1) of the Act, 21 U.S.C. section 360bbb-3(b)(1), unless the authorization is terminated or revoked.  Performed at [facility], [address] [place], [place] [address]   Culture, blood (routine x 2)     Status: None   Collection Time: 11/10/20  6:04 PM   Specimen: BLOOD  Result Value Ref Range Status   Specimen Description   Final    BLOOD PICC LINE Performed at [facility] [address] [place], [place] [address]    Special Requests   Final    BOTTLES DRAWN AEROBIC AND ANAEROBIC Blood Culture adequate volume Performed at [facility], [address] [place], [place] [address]    Culture   Final    NO GROWTH 5 DAYS Performed at [facility], [place] [address] [place], [place] [address]    Report Status 11/16/2020 FINAL  Final  Culture, blood (routine x 2)     Status: None   Collection Time: 11/10/20  6:05 PM   Specimen: BLOOD LEFT ARM  Result Value Ref Range Status   Specimen Description   Final    BLOOD LEFT ARM Performed at [facility], [address] [place], [place] [address]    Special Requests   Final    BOTTLES DRAWN AEROBIC AND ANAEROBIC Blood Culture adequate volume Performed at [facility], [address] [place], [place] [address]    Culture   Final    NO GROWTH 5 DAYS  Performed at [facility], [place] [address], [place], [place] [address]    Report Status 11/16/2020 FINAL  Final  Respiratory (~20 pathogens) panel by PCR     Status: None   Collection Time: 11/12/20  1:51 PM  Result Value Ref Range Status   Adenovirus NOT DETECTED NOT DETECTED Final   Coronavirus 229E NOT DETECTED NOT DETECTED Final    Comment: (NOTE) The Coronavirus on the Respiratory  Panel, DOES NOT test for the novel  Coronavirus (2019 nCoV)    Coronavirus HKU1 NOT DETECTED NOT DETECTED Final   Coronavirus NL63 NOT DETECTED NOT DETECTED Final   Coronavirus OC43 NOT DETECTED NOT DETECTED Final   Metapneumovirus NOT DETECTED NOT DETECTED Final   Rhinovirus / Enterovirus NOT DETECTED NOT DETECTED Final   Influenza A NOT DETECTED NOT DETECTED Final   Influenza B NOT DETECTED NOT DETECTED Final   Parainfluenza Virus 1 NOT DETECTED NOT DETECTED Final   Parainfluenza Virus 2 NOT DETECTED NOT DETECTED Final   Parainfluenza Virus 3 NOT DETECTED NOT DETECTED Final   Parainfluenza Virus 4 NOT DETECTED NOT DETECTED Final   Respiratory Syncytial Virus NOT DETECTED NOT DETECTED Final   Bordetella pertussis NOT DETECTED NOT DETECTED Final   Bordetella Parapertussis NOT DETECTED NOT DETECTED Final   Chlamydophila pneumoniae NOT DETECTED NOT DETECTED Final   Mycoplasma pneumoniae NOT DETECTED NOT DETECTED Final    Comment: Performed at [facility], [address] [place], [place] [address]  Aerobic/Anaerobic Culture (surgical/deep wound)     Status: None   Collection Time: 11/13/20  8:47 AM   Specimen: Thoracic; Wound  Result Value Ref Range Status   Specimen Description   Final    THORACIC T5 T6 Performed at [place] [address], [place], [place] [address]    Special Requests   Final    Normal Performed at [facility], [place] [address], [place], [place] [address]    Gram Stain   Final    FEW WBC PRESENT,BOTH PMN AND MONONUCLEAR NO ORGANISMS SEEN    Culture   Final    No growth aerobically or anaerobically. Performed at [facility], [place] [address] [place], [place] [address]    Report Status 11/18/2020 FINAL  Final  Culture, respiratory     Status: None (Preliminary result)   Collection Time: 11/17/20 11:48 AM   Specimen: Bronchoalveolar Lavage; Respiratory  Result Value Ref Range Status   Specimen  Description   Final    BRONCHIAL ALVEOLAR LAVAGE Performed at [place] [address], [place], [place] [address]    Special Requests   Final    NONE Performed at [facility], [place] [address], [place], [place] [address]    Gram Stain   Final    RARE WBC PRESENT, PREDOMINANTLY PMN NO ORGANISMS SEEN    Culture   Final    NO GROWTH < 24 HOURS Performed at [place] [address], [place], [place] [address]    Report Status PENDING  Incomplete  Acid Fast Smear (AFB)     Status: None   Collection Time: 11/17/20 11:48 AM   Specimen: Bronchial Alveolar Lavage  Result Value Ref Range Status   AFB Specimen Processing Concentration  Final   Acid Fast Smear Negative  Final    Comment: (NOTE) Performed At: [facility] [place], [place] [id number] Rush Farmer MD [id number]    Source (  AFB) BRONCHIAL ALVEOLAR LAVAGE  Final    Comment: Performed at [place] [address], [place], [place] [address]     Radiology Studies: DG CHEST PORT 1 VIEW  Result Date: 11/17/2020 CLINICAL DATA:  Status post bronchoscopy EXAM: PORTABLE CHEST 1 VIEW COMPARISON:  November 10, 2020 chest radiograph; chest CT Nov 15, 2020 FINDINGS: Central catheter no longer present. Widespread airspace opacity persists throughout the right lung. Small right pleural effusion. Airspace consolidation noted throughout portions of the right mid and lower lung regions as well as right apex. Left lung is clear. Heart size is normal. Pulmonary vascularity within normal limits. No adenopathy is appreciable by radiography. Postoperative changes noted in much of the mid and lower thoracic and visualized upper lumbar spine regions. IMPRESSION: Extensive airspace opacity throughout the right lung with areas of consolidation in the right apex as well as in the right lower lung region. Small right pleural effusion. Left lung clear. Stable  cardiac silhouette. No evident pneumothorax. Electronically Signed   By: Lowella Grip III M.D.   On: 11/17/2020 14:26   DG C-ARM BRONCHOSCOPY  Result Date: 11/17/2020 C-ARM BRONCHOSCOPY: Fluoroscopy was utilized by the requesting physician.  No radiographic interpretation.   DG CHEST PORT 1 VIEW  Final Result    DG C-ARM BRONCHOSCOPY  Final Result    VAS [place] UPPER EXTREMITY VENOUS DUPLEX  Final Result    CT ANGIO CHEST PE W OR WO CONTRAST  Final Result    IR US Guide Bx Asp/Drain  Final Result    MR THORACIC SPINE W WO CONTRAST  Final Result    CT Angio Chest PE W/Cm &/Or Wo Cm  Final Result    DG Chest Port 1 View  Final Result      Scheduled Meds: . atorvastatin  10 mg Oral Daily  . busPIRone  7.5 mg Oral BID  . Chlorhexidine Gluconate Cloth  6 each Topical Daily  . dextromethorphan-guaiFENesin  1 tablet Oral BID  . famotidine  20 mg Oral Daily  . Ferrous Fumarate  1 tablet Oral Daily  . insulin aspart  0-20 Units Subcutaneous Q4H  . ipratropium-albuterol  3 mL Nebulization TID  . irbesartan  37.5 mg Oral Daily  . methylPREDNISolone (SOLU-MEDROL) injection  125 mg Intravenous Q6H  . pantoprazole  40 mg Oral BID AC  . pregabalin  75 mg Oral BID  . sucralfate  1 g Oral TID WC & HS  . sulfamethoxazole-trimethoprim  1 tablet Oral Q12H  . tamsulosin  0.8 mg Oral Daily  . venlafaxine XR  150 mg Oral Q breakfast   PRN Meds: acetaminophen, ALPRAZolam, hydrALAZINE, HYDROcodone-acetaminophen, ipratropium-albuterol, labetalol, morphine injection, sodium chloride, sodium chloride flush, tiZANidine, zolpidem Continuous Infusions: . heparin 1,650 Units/hr (11/19/20 0321)  . lactated ringers 10 mL/hr at 11/17/20 1106     LOS: 8 days  Time spent: Greater than 50% of the 35 minute visit was spent in counseling/coordination of care for the patient as laid out in the A&P.   Little Ishikawa, DO [facility] 11/19/2020, 7:11 AM

## 2020-11-19 NOTE — Progress Notes (Addendum)
Subjective:  Feels better today   Antibiotics:  Anti-infectives (From admission, onward)   Start     Dose/Rate Route Frequency Ordered Stop   11/18/20 2200  sulfamethoxazole-trimethoprim (BACTRIM DS) 800-160 MG per tablet 1 tablet        1 tablet Oral Every 12 hours 11/18/20 1404     11/15/20 1000  sulfamethoxazole-trimethoprim (BACTRIM DS) 800-160 MG per tablet 1 tablet  Status:  Discontinued        1 tablet Oral Every 12 hours 11/13/20 1441 11/15/20 0912   11/15/20 1000  vancomycin (VANCOREADY) IVPB 1000 mg/200 mL  Status:  Discontinued        1,000 mg 200 mL/hr over 60 Minutes Intravenous Every 12 hours 11/15/20 0911 11/18/20 1356   11/14/20 2000  ceFEPIme (MAXIPIME) 2 g in sodium chloride 0.9 % 100 mL IVPB  Status:  Discontinued        2 g 200 mL/hr over 30 Minutes Intravenous Every 8 hours 11/14/20 1216 11/18/20 1356   11/11/20 1400  metroNIDAZOLE (FLAGYL) tablet 500 mg  Status:  Discontinued        500 mg Oral Every 8 hours 11/11/20 0941 11/17/20 1641   11/11/20 0100  azithromycin (ZITHROMAX) 500 mg in sodium chloride 0.9 % 250 mL IVPB  Status:  Discontinued        500 mg 250 mL/hr over 60 Minutes Intravenous Every 24 hours 11/10/20 2324 11/11/20 0941   11/10/20 1830  ceFEPIme (MAXIPIME) 2 g in sodium chloride 0.9 % 100 mL IVPB  Status:  Discontinued        2 g 200 mL/hr over 30 Minutes Intravenous Every 8 hours 11/10/20 1809 11/14/20 1216   11/10/20 1830  vancomycin (VANCOREADY) IVPB 1750 mg/350 mL  Status:  Discontinued        1,750 mg 175 mL/hr over 120 Minutes Intravenous Every 24 hours 11/10/20 1809 11/10/20 1817   11/10/20 1830  vancomycin (VANCOCIN) IVPB 1000 mg/200 mL premix        1,000 mg 200 mL/hr over 60 Minutes Intravenous Every 12 hours 11/10/20 1817 11/14/20 1925   11/10/20 1816  vancomycin (VANCOCIN) 1000 MG powder       Note to Pharmacy: Guerry Bruin   : cabinet override      11/10/20 1816 11/10/20 1946      Medications: Scheduled  Meds: . atorvastatin  10 mg Oral Daily  . busPIRone  7.5 mg Oral BID  . Chlorhexidine Gluconate Cloth  6 each Topical Daily  . dextromethorphan-guaiFENesin  1 tablet Oral BID  . famotidine  20 mg Oral Daily  . Ferrous Fumarate  1 tablet Oral Daily  . insulin aspart  0-20 Units Subcutaneous Q4H  . ipratropium-albuterol  3 mL Nebulization TID  . irbesartan  37.5 mg Oral Daily  . methylPREDNISolone (SOLU-MEDROL) injection  125 mg Intravenous Q6H  . pantoprazole  40 mg Oral BID AC  . pregabalin  75 mg Oral BID  . Rivaroxaban  15 mg Oral BID WC   Followed by  . [START ON 12/10/2020] rivaroxaban  20 mg Oral Q supper  . sucralfate  1 g Oral TID WC & HS  . sulfamethoxazole-trimethoprim  1 tablet Oral Q12H  . tamsulosin  0.8 mg Oral Daily  . venlafaxine XR  150 mg Oral Q breakfast   Continuous Infusions: . heparin 1,600 Units/hr (11/19/20 1043)  . lactated ringers 10 mL/hr at 11/17/20 1106   PRN Meds:.acetaminophen, ALPRAZolam, hydrALAZINE, HYDROcodone-acetaminophen, ipratropium-albuterol,  labetalol, morphine injection, sodium chloride, sodium chloride flush, tiZANidine, zolpidem    Objective: Weight change:   Intake/Output Summary (Last 24 hours) at 11/19/2020 1414 Last data filed at 11/19/2020 1310 Gross per 24 hour  Intake 212.3 ml  Output 2350 ml  Net -2137.7 ml   Blood pressure 135/63, pulse 60, temperature 97.8 F (36.6 C), temperature source Oral, resp. rate (!) 22, height _0  (1.778 m), weight 75.9 kg, SpO2 93 %. Temp:  [97.8 F (36.6 C)-99.4 F (37.4 C)] 97.8 F (36.6 C) (05/05 1242) Pulse Rate:  [60-94] 60 (05/05 1242) Resp:  [16-26] 22 (05/05 1242) BP: (112-159)/(63-77) 135/63 (05/05 1242) SpO2:  [92 %-97 %] 93 % (05/05 1330)  Physical Exam: Physical Exam HENT:     Head: Normocephalic and atraumatic.  Eyes:     Conjunctiva/sclera: Conjunctivae normal.  Cardiovascular:     Rate and Rhythm: Normal rate and regular rhythm.  Pulmonary:     Effort: Pulmonary  effort is normal.     Breath sounds: Examination of the right-middle field reveals rhonchi. Examination of the right-lower field reveals rhonchi. Rhonchi present.  Abdominal:     General: There is no distension.     Palpations: Abdomen is soft.  Musculoskeletal:        General: Normal range of motion.     Cervical back: Normal range of motion and neck supple.  Skin:    General: Skin is warm and dry.     Findings: No erythema or rash.  Neurological:     General: No focal deficit present.     Mental Status: He is alert and oriented to person, place, and time.  Psychiatric:        Mood and Affect: Mood is depressed.        Behavior: Behavior normal.        Thought Content: Thought content normal.        Judgment: Judgment normal.      CBC:    BMET Recent Labs    11/18/20 0220 11/19/20 0450  NA 133* 134*  K 4.1 4.6  CL 102 100  CO2 25 22  GLUCOSE 260* 271*  BUN 21 19  CREATININE 0.58* 0.54*  CALCIUM 8.1* 8.4*     Liver Panel  No results for input(s): PROT, ALBUMIN, AST, ALT, ALKPHOS, BILITOT, BILIDIR, IBILI in the last 72 hours.     Sedimentation Rate Recent Labs    11/18/20 1608  ESRSEDRATE 9   C-Reactive Protein No results for input(s): CRP in the last 72 hours.  Micro Results: Recent Results (from the past 720 hour(s))  Resp Panel by RT-PCR (Flu A&B, Covid) Nasopharyngeal Swab     Status: None   Collection Time: 11/10/20  4:54 PM   Specimen: Nasopharyngeal Swab; Nasopharyngeal(NP) swabs in vial transport medium  Result Value Ref Range Status   SARS Coronavirus 2 by RT PCR NEGATIVE NEGATIVE Final    Comment: (NOTE) SARS-CoV-2 target nucleic acids are NOT DETECTED.  The SARS-CoV-2 RNA is generally detectable in upper respiratory specimens during the acute phase of infection. The lowest concentration of SARS-CoV-2 viral copies this assay can detect is 138 copies/mL. A negative result does not preclude SARS-Cov-2 infection and should not be used as  the sole basis for treatment or other patient management decisions. A negative result may occur with  improper specimen collection/handling, submission of specimen other than nasopharyngeal swab, presence of viral mutation(s) within the areas targeted by this assay, and inadequate number of viral copies(<138  copies/mL). A negative result must be combined with clinical observations, patient history, and epidemiological information. The expected result is Negative.  Fact Sheet for Patients:  EntrepreneurPulse.com.au  Fact Sheet for Healthcare Providers:  IncredibleEmployment.be  This test is no t yet approved or cleared by the [place] FDA and  has been authorized for detection and/or diagnosis of SARS-CoV-2 by FDA under an Emergency Use Authorization (EUA). This EUA will remain  in effect (meaning this test can be used) for the duration of the COVID-19 declaration under Section 564(b)(1) of the Act, 21 U.S.C.section 360bbb-3(b)(1), unless the authorization is terminated  or revoked sooner.       Influenza A by PCR NEGATIVE NEGATIVE Final   Influenza B by PCR NEGATIVE NEGATIVE Final    Comment: (NOTE) The Xpert Xpress SARS-CoV-2/FLU/RSV plus assay is intended as an aid in the diagnosis of influenza from Nasopharyngeal swab specimens and should not be used as a sole basis for treatment. Nasal washings and aspirates are unacceptable for Xpert Xpress SARS-CoV-2/FLU/RSV testing.  Fact Sheet for Patients: EntrepreneurPulse.com.au  Fact Sheet for Healthcare Providers: IncredibleEmployment.be  This test is not yet approved or cleared by the [place] FDA and has been authorized for detection and/or diagnosis of SARS-CoV-2 by FDA under an Emergency Use Authorization (EUA). This EUA will remain in effect (meaning this test can be used) for the duration of the COVID-19 declaration under Section 564(b)(1) of  the Act, 21 U.S.C. section 360bbb-3(b)(1), unless the authorization is terminated or revoked.  Performed at [facility], [address] [place], [place] [address]   Culture, blood (routine x 2)     Status: None   Collection Time: 11/10/20  6:04 PM   Specimen: BLOOD  Result Value Ref Range Status   Specimen Description   Final    BLOOD PICC LINE Performed at [facility], [address] [place], [place] [address]    Special Requests   Final    BOTTLES DRAWN AEROBIC AND ANAEROBIC Blood Culture adequate volume Performed at [facility], [address] [place], [place] [address]    Culture   Final    NO GROWTH 5 DAYS Performed at [facility], [place] [address] [place], [place] [address]    Report Status 11/16/2020 FINAL  Final  Culture, blood (routine x 2)     Status: None   Collection Time: 11/10/20  6:05 PM   Specimen: BLOOD LEFT ARM  Result Value Ref Range Status   Specimen Description   Final    BLOOD LEFT ARM Performed at [facility], [address] [place], [place] [address]    Special Requests   Final    BOTTLES DRAWN AEROBIC AND ANAEROBIC Blood Culture adequate volume Performed at [facility], [address], [place], [place] [address]    Culture   Final    NO GROWTH 5 DAYS Performed at [facility], [place] [address], [place], [place] [address]    Report Status 11/16/2020 FINAL  Final  Respiratory (~20 pathogens) panel by PCR     Status: None   Collection Time: 11/12/20  1:51 PM  Result Value Ref Range Status   Adenovirus NOT DETECTED NOT DETECTED Final   Coronavirus 229E NOT DETECTED NOT DETECTED Final    Comment: (NOTE) The Coronavirus on the Respiratory Panel, DOES NOT test for the novel  Coronavirus (2019 nCoV)    Coronavirus HKU1  NOT DETECTED NOT DETECTED Final   Coronavirus NL63 NOT DETECTED NOT DETECTED Final   Coronavirus OC43 NOT DETECTED NOT DETECTED Final   Metapneumovirus NOT  DETECTED NOT DETECTED Final   Rhinovirus / Enterovirus NOT DETECTED NOT DETECTED Final   Influenza A NOT DETECTED NOT DETECTED Final   Influenza B NOT DETECTED NOT DETECTED Final   Parainfluenza Virus 1 NOT DETECTED NOT DETECTED Final   Parainfluenza Virus 2 NOT DETECTED NOT DETECTED Final   Parainfluenza Virus 3 NOT DETECTED NOT DETECTED Final   Parainfluenza Virus 4 NOT DETECTED NOT DETECTED Final   Respiratory Syncytial Virus NOT DETECTED NOT DETECTED Final   Bordetella pertussis NOT DETECTED NOT DETECTED Final   Bordetella Parapertussis NOT DETECTED NOT DETECTED Final   Chlamydophila pneumoniae NOT DETECTED NOT DETECTED Final   Mycoplasma pneumoniae NOT DETECTED NOT DETECTED Final    Comment: Performed at [facility], [place] [address] [place], [place] [address]  Aerobic/Anaerobic Culture (surgical/deep wound)     Status: None   Collection Time: 11/13/20  8:47 AM   Specimen: Thoracic; Wound  Result Value Ref Range Status   Specimen Description   Final    THORACIC T5 T6 Performed at [place] [address] [place], [place] [address]    Special Requests   Final    Normal Performed at [facility], [place] [address], [place], [place] [address]    Gram Stain   Final    FEW WBC PRESENT,BOTH PMN AND MONONUCLEAR NO ORGANISMS SEEN    Culture   Final    No growth aerobically or anaerobically. Performed at [facility], [place] [address] [place], [place] [address]    Report Status 11/18/2020 FINAL  Final  Culture, respiratory     Status: None   Collection Time: 11/17/20 11:48 AM   Specimen: Bronchoalveolar Lavage; Respiratory  Result Value Ref Range Status   Specimen Description   Final    BRONCHIAL ALVEOLAR LAVAGE Performed at [facility] [address] [place], [place] [address]    Special Requests   Final    NONE Performed at [facility], [place] [address] [place], [place] [address]     Gram Stain   Final    RARE WBC PRESENT, PREDOMINANTLY PMN NO ORGANISMS SEEN    Culture   Final    NO GROWTH 2 DAYS Performed at [place] [address] [place], [place] [address]    Report Status 11/19/2020 FINAL  Final  Acid Fast Smear (AFB)     Status: None   Collection Time: 11/17/20 11:48 AM   Specimen: Bronchial Alveolar Lavage  Result Value Ref Range Status   AFB Specimen Processing Concentration  Final   Acid Fast Smear Negative  Final    Comment: (NOTE) Performed At: [facility] [place], [place] [id number] Rush Farmer MD [id number]    Source (AFB) BRONCHIAL ALVEOLAR LAVAGE  Final    Comment: Performed at [facility], [place] [address], [place], [place] [address]    Studies/Results: No results found.    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  He then had loosening of screws and had them removed by Dr. Louanne Skye due to continuous pain at that site.  There is no purulence but culture did yield a Staph epidermidis.  He was placed on oral Bactrim and then seen by my partner Dr. Gale Journey who placed him on IV daptomycin with stop date due in a few days.  He has now been admitted with extensive right-sided pneumonia.  He continued to  complain of back pain that is not improved despite IV antibiotics.  He did not have resolution of his pneumonia with appropriate abx and he has now had bronchoscopy and steroids have been started for possible eosinophilic PNA from daptomycin.  #1 Pneumonia:   He has had nearly 10  days of antimicrobial therapy  Bronchoscopy failed to reveal any organisms. Path unrevealing  He has been started on steroids for possibleeosinophilic pneumonia due to daptomycin .   #2  Hardware associated thoracic spine infection: MRI is reassuring inflammatory markers are up but also could be due to his pneumonia.  We are switching to bactrim DS BID (which will also provide PJP prevention)    Luis Brown has an appointment on 12/04/20 at 245 PM with Dr. Tommy Medal  He should arrive 15-30 minutes prior to his appointment.  The [facility] is located in the [facility] at  [place] in [place].  [address], which is located to the left of the elevators.  Phone: [phone]  Fax: [phone]  [email or web address]      LOS: 8 days   Alcide Evener 11/19/2020, 2:14 PM

## 2020-11-20 ENCOUNTER — Ambulatory Visit: Payer: Medicare Other | Admitting: Infectious Disease

## 2020-11-20 ENCOUNTER — Telehealth: Payer: Self-pay | Admitting: Internal Medicine

## 2020-11-20 DIAGNOSIS — E119 Type 2 diabetes mellitus without complications: Secondary | ICD-10-CM | POA: Diagnosis not present

## 2020-11-20 DIAGNOSIS — J189 Pneumonia, unspecified organism: Secondary | ICD-10-CM | POA: Diagnosis not present

## 2020-11-20 DIAGNOSIS — T50905A Adverse effect of unspecified drugs, medicaments and biological substances, initial encounter: Secondary | ICD-10-CM

## 2020-11-20 DIAGNOSIS — J9601 Acute respiratory failure with hypoxia: Secondary | ICD-10-CM | POA: Diagnosis not present

## 2020-11-20 DIAGNOSIS — N400 Enlarged prostate without lower urinary tract symptoms: Secondary | ICD-10-CM | POA: Diagnosis not present

## 2020-11-20 LAB — CBC WITH DIFFERENTIAL/PLATELET
Abs Immature Granulocytes: 0.36 10*3/uL — ABNORMAL HIGH (ref 0.00–0.07)
Basophils Absolute: 0 10*3/uL (ref 0.0–0.1)
Basophils Relative: 0 %
Eosinophils Absolute: 0 10*3/uL (ref 0.0–0.5)
Eosinophils Relative: 0 %
HCT: 34.2 % — ABNORMAL LOW (ref 39.0–52.0)
Hemoglobin: 11 g/dL — ABNORMAL LOW (ref 13.0–17.0)
Immature Granulocytes: 2 %
Lymphocytes Relative: 4 %
Lymphs Abs: 0.6 10*3/uL — ABNORMAL LOW (ref 0.7–4.0)
MCH: 29.7 pg (ref 26.0–34.0)
MCHC: 32.2 g/dL (ref 30.0–36.0)
MCV: 92.4 fL (ref 80.0–100.0)
Monocytes Absolute: 0.5 10*3/uL (ref 0.1–1.0)
Monocytes Relative: 3 %
Neutro Abs: 15.4 10*3/uL — ABNORMAL HIGH (ref 1.7–7.7)
Neutrophils Relative %: 91 %
Platelets: 323 10*3/uL (ref 150–400)
RBC: 3.7 MIL/uL — ABNORMAL LOW (ref 4.22–5.81)
RDW: 14.2 % (ref 11.5–15.5)
WBC: 16.9 10*3/uL — ABNORMAL HIGH (ref 4.0–10.5)
nRBC: 0 % (ref 0.0–0.2)

## 2020-11-20 LAB — BASIC METABOLIC PANEL
Anion gap: 10 (ref 5–15)
BUN: 23 mg/dL (ref 8–23)
CO2: 23 mmol/L (ref 22–32)
Calcium: 8.2 mg/dL — ABNORMAL LOW (ref 8.9–10.3)
Chloride: 98 mmol/L (ref 98–111)
Creatinine, Ser: 0.58 mg/dL — ABNORMAL LOW (ref 0.61–1.24)
GFR, Estimated: >60 mL/min (ref >60)
Glucose, Bld: 235 mg/dL — ABNORMAL HIGH (ref 70–99)
Potassium: 4 mmol/L (ref 3.5–5.1)
Sodium: 131 mmol/L — ABNORMAL LOW (ref 135–145)

## 2020-11-20 LAB — GLUCOSE, CAPILLARY
Glucose-Capillary: 202 mg/dL — ABNORMAL HIGH (ref 70–99)
Glucose-Capillary: 221 mg/dL — ABNORMAL HIGH (ref 70–99)
Glucose-Capillary: 236 mg/dL — ABNORMAL HIGH (ref 70–99)
Glucose-Capillary: 289 mg/dL — ABNORMAL HIGH (ref 70–99)
Glucose-Capillary: 293 mg/dL — ABNORMAL HIGH (ref 70–99)
Glucose-Capillary: 307 mg/dL — ABNORMAL HIGH (ref 70–99)

## 2020-11-20 LAB — MPO/PR-3 (ANCA) ANTIBODIES
ANCA Proteinase 3: <3.5 U/mL (ref 0.0–3.5)
Myeloperoxidase Abs: <9 U/mL (ref 0.0–9.0)

## 2020-11-20 LAB — ANTINUCLEAR ANTIBODIES, IFA: ANA Ab, IFA: NEGATIVE

## 2020-11-20 LAB — CYCLIC CITRUL PEPTIDE ANTIBODY, IGG/IGA: CCP Antibodies IgG/IgA: 4 units (ref 0–19)

## 2020-11-20 LAB — MAGNESIUM: Magnesium: 2 mg/dL (ref 1.7–2.4)

## 2020-11-20 MED ORDER — RIVAROXABAN (XARELTO) EDUCATION KIT FOR DVT/PE PATIENTS
PACK | Freq: Once | Status: AC
  Filled 2020-11-20: qty 1

## 2020-11-20 MED ORDER — PREDNISONE 20 MG PO TABS
60.0000 mg | ORAL_TABLET | Freq: Every day | ORAL | Status: DC
  Administered 2020-11-21 – 2020-11-23 (×3): 60 mg via ORAL
  Filled 2020-11-20 (×3): qty 3

## 2020-11-20 NOTE — Progress Notes (Signed)
Physical Therapy Treatment Patient Details Name: Luis Brown MRN: [id number] DOB: [birth year] Today's Date: 11/20/2020    History of Present Illness Pt is 75 yo male who presented to ED with hypoxia on 11/10/20 and admitted with atypical pnemonia.  Additionally, pt had MRI T spine and found to have subcutaneous seroma and is s/p aspiration on 11/13/20.  He has PMH including recent vertebral osteomyelitis requiring removal of hardware on 09/14/20 still on antibiotics, peripheral neuropathy, HLC, HTN, DM, DDD, BPH, anxiety and depression.    PT Comments    Pt making gradual progress today.  He did ambulate 75'x2.  Pt was on 3 L O2 rest with sats 93%, ambulated on 4L with sats 84% but did briefly drop as low as 81% but pt tolerating and motivated to increase activity. This is good improvement over yesterday (required 8L dropped to 79%).  Encouraged ambulation with nursing.  Will continue to advance with PT as able.     Follow Up Recommendations  Home health PT     Equipment Recommendations   (rollator - says has access to one)    Recommendations for Other Services       Precautions / Restrictions Precautions Precautions: Fall Precaution Comments: monitor O2    Mobility  Bed Mobility Overal bed mobility: Needs Assistance Bed Mobility: Supine to Sit;Sit to Supine     Supine to sit: Supervision Sit to supine: Supervision   General bed mobility comments: for lines    Transfers Overall transfer level: Needs assistance Equipment used: None Transfers: Sit to/from Stand Sit to Stand: Min guard            Ambulation/Gait Ambulation/Gait assistance: Min assist Gait Distance (Feet):  (10' to bathroom then 75'x2 to nurses station and back) Assistive device: 4-wheeled walker Gait Pattern/deviations: Step-through pattern;Decreased stride length Gait velocity: decreased   General Gait Details: Ambulated 75'x2 with standing rest break.  Pt cued and self cued for relaxation and  focus on breathing. Good use of brakes on rollator when resting.  See general comments for VS   Stairs             Wheelchair Mobility    Modified Rankin (Stroke Patients Only)       Balance Overall balance assessment: Needs assistance   Sitting balance-Leahy Scale: Normal     Standing balance support: No upper extremity supported;Bilateral upper extremity supported Standing balance-Leahy Scale: Fair Standing balance comment: Requirng bil UE support for ambulation but could static stand without UE support                            Cognition Arousal/Alertness: Awake/alert Behavior During Therapy: WFL for tasks assessed/performed Overall Cognitive Status: Within Functional Limits for tasks assessed                                        Exercises      General Comments General comments (skin integrity, edema, etc.): Pt on 3 L O2 at rest with sats 93%. Left on 3 L for ambulation initially with sats quickly dropping to 84% when pt just walked to bathroom. Placed up on 4 L while pt standing to urinate and after 30 seconds back to 90%.  Ambulated on 4 L with sats ~84% but did drop as low as 81% but pt tolerating with DOE of 3/4.  Once back  in room 3 mins to recover to 90% and then to wean back to 3 L with sats 90% an additional 2 mins. Encouraged ambulation with nursing over weekend      Pertinent Vitals/Pain Pain Assessment: No/denies pain    Home Living                      Prior Function            PT Goals (current goals can now be found in the care plan section) Acute Rehab PT Goals Patient Stated Goal: return home; get back to normal and off O2 PT Goal Formulation: With patient/family Time For Goal Achievement: 11/27/20 Potential to Achieve Goals: Good Progress towards PT goals: Progressing toward goals    Frequency    Min 3X/week      PT Plan Current plan remains appropriate    Co-evaluation               AM-PAC PT "6 Clicks" Mobility   Outcome Measure  Help needed turning from your back to your side while in a flat bed without using bedrails?: None Help needed moving from lying on your back to sitting on the side of a flat bed without using bedrails?: None Help needed moving to and from a bed to a chair (including a wheelchair)?: A Little Help needed standing up from a chair using your arms (e.g., wheelchair or bedside chair)?: A Little Help needed to walk in hospital room?: A Little Help needed climbing 3-5 steps with a railing? : A Little 6 Click Score: 20    End of Session Equipment Utilized During Treatment: Oxygen Activity Tolerance: Patient tolerated treatment well Patient left: in bed;with call bell/phone within reach;with family/visitor present;with bed alarm set Nurse Communication: Mobility status;Other (comment) (Vitals, encouraged ambulation over weekend (notified RN and wrote on whiteboard)) PT Visit Diagnosis: Other abnormalities of gait and mobility (R26.89);Unsteadiness on feet (R26.81)     Time: 4431-5400 PT Time Calculation (min) (ACUTE ONLY): 24 min  Charges:  $Gait Training: 8-22 mins $Therapeutic Activity: 8-22 mins                     Abran Richard, PT Acute Rehab Services Pager [phone] [facility] 11/20/2020, 4:58 PM

## 2020-11-20 NOTE — TOC Progression Note (Signed)
Transition of Care [facility]) - Progression Note    Patient Details  Name: Luis Brown MRN: [id number] Date of Birth: 14-Nov-1945  Transition of Care [facility]) CM/SW Contact  Chasmine Lender, Juliann Pulse, RN Phone Number: 11/20/2020, 10:49 AM  Clinical Narrative:   D/c plan home w/HHPT-[facility] rep Ronalee Belts able to provide.     Barriers to Discharge: Continued Medical Work up  Expected Discharge Plan and Services    Discharge Planning Services: CM Consult Post Acute Care Choice:  (otpt PT) Living arrangements for the past 2 months: [place]: PT [place]: [facility] [employer] Date [place]: 11/20/20 Time [place]: [place] Representative spoke with at [place]: [place] ([place]) Interventions    Readmission Risk Interventions Readmission Risk Prevention Plan 11/19/2020  Transportation Screening Complete  PCP or Specialist Appt within 5-7 Days Complete  Home Care Screening Complete  Medication Review (RN CM) Complete  Some recent data might be hidden

## 2020-11-20 NOTE — Progress Notes (Signed)
Subjective:  Continues to feel better  Antibiotics:  Anti-infectives (From admission, onward)   Start     Dose/Rate Route Frequency Ordered Stop   11/18/20 2200  sulfamethoxazole-trimethoprim (BACTRIM DS) 800-160 MG per tablet 1 tablet        1 tablet Oral Every 12 hours 11/18/20 1404     11/15/20 1000  sulfamethoxazole-trimethoprim (BACTRIM DS) 800-160 MG per tablet 1 tablet  Status:  Discontinued        1 tablet Oral Every 12 hours 11/13/20 1441 11/15/20 0912   11/15/20 1000  vancomycin (VANCOREADY) IVPB 1000 mg/200 mL  Status:  Discontinued        1,000 mg 200 mL/hr over 60 Minutes Intravenous Every 12 hours 11/15/20 0911 11/18/20 1356   11/14/20 2000  ceFEPIme (MAXIPIME) 2 g in sodium chloride 0.9 % 100 mL IVPB  Status:  Discontinued        2 g 200 mL/hr over 30 Minutes Intravenous Every 8 hours 11/14/20 1216 11/18/20 1356   11/11/20 1400  metroNIDAZOLE (FLAGYL) tablet 500 mg  Status:  Discontinued        500 mg Oral Every 8 hours 11/11/20 0941 11/17/20 1641   11/11/20 0100  azithromycin (ZITHROMAX) 500 mg in sodium chloride 0.9 % 250 mL IVPB  Status:  Discontinued        500 mg 250 mL/hr over 60 Minutes Intravenous Every 24 hours 11/10/20 2324 11/11/20 0941   11/10/20 1830  ceFEPIme (MAXIPIME) 2 g in sodium chloride 0.9 % 100 mL IVPB  Status:  Discontinued        2 g 200 mL/hr over 30 Minutes Intravenous Every 8 hours 11/10/20 1809 11/14/20 1216   11/10/20 1830  vancomycin (VANCOREADY) IVPB 1750 mg/350 mL  Status:  Discontinued        1,750 mg 175 mL/hr over 120 Minutes Intravenous Every 24 hours 11/10/20 1809 11/10/20 1817   11/10/20 1830  vancomycin (VANCOCIN) IVPB 1000 mg/200 mL premix        1,000 mg 200 mL/hr over 60 Minutes Intravenous Every 12 hours 11/10/20 1817 11/14/20 1925   11/10/20 1816  vancomycin (VANCOCIN) 1000 MG powder       Note to Pharmacy: Guerry Bruin   : cabinet override      11/10/20 1816 11/10/20 1946      Medications: Scheduled  Meds: . atorvastatin  10 mg Oral Daily  . busPIRone  7.5 mg Oral BID  . Chlorhexidine Gluconate Cloth  6 each Topical Daily  . dextromethorphan-guaiFENesin  1 tablet Oral BID  . famotidine  20 mg Oral Daily  . Ferrous Fumarate  1 tablet Oral Daily  . insulin aspart  0-20 Units Subcutaneous Q4H  . ipratropium-albuterol  3 mL Nebulization TID  . irbesartan  37.5 mg Oral Daily  . methylPREDNISolone (SOLU-MEDROL) injection  125 mg Intravenous Q6H  . pantoprazole  40 mg Oral BID AC  . [START ON 11/21/2020] predniSONE  60 mg Oral Q breakfast  . pregabalin  75 mg Oral BID  . Rivaroxaban  15 mg Oral BID WC   Followed by  . [START ON 12/10/2020] rivaroxaban  20 mg Oral Q supper  . sucralfate  1 g Oral TID WC & HS  . sulfamethoxazole-trimethoprim  1 tablet Oral Q12H  . tamsulosin  0.8 mg Oral Daily  . venlafaxine XR  150 mg Oral Q breakfast   Continuous Infusions: . lactated ringers 10 mL/hr at 11/17/20 1106   PRN  Meds:.acetaminophen, ALPRAZolam, hydrALAZINE, HYDROcodone-acetaminophen, ipratropium-albuterol, labetalol, morphine injection, sodium chloride, sodium chloride flush, tiZANidine, zolpidem    Objective: Weight change:   Intake/Output Summary (Last 24 hours) at 11/20/2020 1656 Last data filed at 11/20/2020 1427 Gross per 24 hour  Intake 520 ml  Output 2675 ml  Net -2155 ml   Blood pressure (!) 152/82, pulse 94, temperature 97.9 F (36.6 C), resp. rate 15, height _0  (1.778 m), weight 75.9 kg, SpO2 90 %. Temp:  [97.6 F (36.4 C)-98.2 F (36.8 C)] 97.9 F (36.6 C) (05/06 1416) Pulse Rate:  [53-94] 94 (05/06 1416) Resp:  [15-23] 15 (05/06 1416) BP: (128-153)/(64-82) 152/82 (05/06 1416) SpO2:  [90 %-96 %] 90 % (05/06 1416)  Physical Exam: Physical Exam HENT:     Head: Normocephalic and atraumatic.  Eyes:     Conjunctiva/sclera: Conjunctivae normal.  Cardiovascular:     Rate and Rhythm: Normal rate and regular rhythm.  Pulmonary:     Effort: Pulmonary effort is normal.      Breath sounds: Examination of the right-middle field reveals rhonchi. Examination of the right-lower field reveals rhonchi. Rhonchi present.  Abdominal:     General: There is no distension.     Palpations: Abdomen is soft.  Musculoskeletal:        General: Normal range of motion.     Cervical back: Normal range of motion and neck supple.  Skin:    General: Skin is warm and dry.     Findings: No erythema or rash.  Neurological:     General: No focal deficit present.     Mental Status: He is alert and oriented to person, place, and time.  Psychiatric:        Mood and Affect: Mood is depressed.        Behavior: Behavior normal.        Thought Content: Thought content normal.        Judgment: Judgment normal.      CBC:    BMET Recent Labs    11/19/20 0450 11/20/20 0352  NA 134* 131*  K 4.6 4.0  CL 100 98  CO2 22 23  GLUCOSE 271* 235*  BUN 19 23  CREATININE 0.54* 0.58*  CALCIUM 8.4* 8.2*     Liver Panel  No results for input(s): PROT, ALBUMIN, AST, ALT, ALKPHOS, BILITOT, BILIDIR, IBILI in the last 72 hours.     Sedimentation Rate Recent Labs    11/18/20 1608  ESRSEDRATE 9   C-Reactive Protein No results for input(s): CRP in the last 72 hours.  Micro Results: Recent Results (from the past 720 hour(s))  Resp Panel by RT-PCR (Flu A&B, Covid) Nasopharyngeal Swab     Status: None   Collection Time: 11/10/20  4:54 PM   Specimen: Nasopharyngeal Swab; Nasopharyngeal(NP) swabs in vial transport medium  Result Value Ref Range Status   SARS Coronavirus 2 by RT PCR NEGATIVE NEGATIVE Final    Comment: (NOTE) SARS-CoV-2 target nucleic acids are NOT DETECTED.  The SARS-CoV-2 RNA is generally detectable in upper respiratory specimens during the acute phase of infection. The lowest concentration of SARS-CoV-2 viral copies this assay can detect is 138 copies/mL. A negative result does not preclude SARS-Cov-2 infection and should not be used as the sole basis for  treatment or other patient management decisions. A negative result may occur with  improper specimen collection/handling, submission of specimen other than nasopharyngeal swab, presence of viral mutation(s) within the areas targeted by this assay, and inadequate number of  viral copies(<138 copies/mL). A negative result must be combined with clinical observations, patient history, and epidemiological information. The expected result is Negative.  Fact Sheet for Patients:  EntrepreneurPulse.com.au  Fact Sheet for Healthcare Providers:  IncredibleEmployment.be  This test is no t yet approved or cleared by the [place] FDA and  has been authorized for detection and/or diagnosis of SARS-CoV-2 by FDA under an Emergency Use Authorization (EUA). This EUA will remain  in effect (meaning this test can be used) for the duration of the COVID-19 declaration under Section 564(b)(1) of the Act, 21 U.S.C.section 360bbb-3(b)(1), unless the authorization is terminated  or revoked sooner.       Influenza A by PCR NEGATIVE NEGATIVE Final   Influenza B by PCR NEGATIVE NEGATIVE Final    Comment: (NOTE) The Xpert Xpress SARS-CoV-2/FLU/RSV plus assay is intended as an aid in the diagnosis of influenza from Nasopharyngeal swab specimens and should not be used as a sole basis for treatment. Nasal washings and aspirates are unacceptable for Xpert Xpress SARS-CoV-2/FLU/RSV testing.  Fact Sheet for Patients: EntrepreneurPulse.com.au  Fact Sheet for Healthcare Providers: IncredibleEmployment.be  This test is not yet approved or cleared by the [place] FDA and has been authorized for detection and/or diagnosis of SARS-CoV-2 by FDA under an Emergency Use Authorization (EUA). This EUA will remain in effect (meaning this test can be used) for the duration of the COVID-19 declaration under Section 564(b)(1) of the Act, 21  U.S.C. section 360bbb-3(b)(1), unless the authorization is terminated or revoked.  Performed at [facility], [address] [place], [place] [address]   Culture, blood (routine x 2)     Status: None   Collection Time: 11/10/20  6:04 PM   Specimen: BLOOD  Result Value Ref Range Status   Specimen Description   Final    BLOOD PICC LINE Performed at [facility], [address] [place], [place] [address]    Special Requests   Final    BOTTLES DRAWN AEROBIC AND ANAEROBIC Blood Culture adequate volume Performed at [facility], [address] [place], [place] [address]    Culture   Final    NO GROWTH 5 DAYS Performed at [facility], [place] [address], [place], [place] [address]    Report Status 11/16/2020 FINAL  Final  Culture, blood (routine x 2)     Status: None   Collection Time: 11/10/20  6:05 PM   Specimen: BLOOD LEFT ARM  Result Value Ref Range Status   Specimen Description   Final    BLOOD LEFT ARM Performed at [facility], [address] [place], [place] [address]    Special Requests   Final    BOTTLES DRAWN AEROBIC AND ANAEROBIC Blood Culture adequate volume Performed at [facility], [address], [place], [place] [address]    Culture   Final    NO GROWTH 5 DAYS Performed at [facility], [place] [address] [place], [place] [address]    Report Status 11/16/2020 FINAL  Final  Respiratory (~20 pathogens) panel by PCR     Status: None   Collection Time: 11/12/20  1:51 PM  Result Value Ref Range Status   Adenovirus NOT DETECTED NOT DETECTED Final   Coronavirus 229E NOT DETECTED NOT DETECTED Final    Comment: (NOTE) The Coronavirus on the Respiratory Panel, DOES NOT test for the novel  Coronavirus (2019 nCoV)  Coronavirus HKU1 NOT DETECTED NOT DETECTED Final   Coronavirus NL63 NOT DETECTED NOT DETECTED Final   Coronavirus OC43 NOT DETECTED NOT DETECTED Final   Metapneumovirus NOT DETECTED NOT  DETECTED Final   Rhinovirus / Enterovirus NOT DETECTED NOT DETECTED Final   Influenza A NOT DETECTED NOT DETECTED Final   Influenza B NOT DETECTED NOT DETECTED Final   Parainfluenza Virus 1 NOT DETECTED NOT DETECTED Final   Parainfluenza Virus 2 NOT DETECTED NOT DETECTED Final   Parainfluenza Virus 3 NOT DETECTED NOT DETECTED Final   Parainfluenza Virus 4 NOT DETECTED NOT DETECTED Final   Respiratory Syncytial Virus NOT DETECTED NOT DETECTED Final   Bordetella pertussis NOT DETECTED NOT DETECTED Final   Bordetella Parapertussis NOT DETECTED NOT DETECTED Final   Chlamydophila pneumoniae NOT DETECTED NOT DETECTED Final   Mycoplasma pneumoniae NOT DETECTED NOT DETECTED Final    Comment: Performed at [facility], [place] [address] [place], [place] [address]  Aerobic/Anaerobic Culture (surgical/deep wound)     Status: None   Collection Time: 11/13/20  8:47 AM   Specimen: Thoracic; Wound  Result Value Ref Range Status   Specimen Description   Final    THORACIC T5 T6 Performed at [place] [address] [place], [place] [address]    Special Requests   Final    Normal Performed at [facility] [place] [address] [place], [place] [address]    Gram Stain   Final    FEW WBC PRESENT,BOTH PMN AND MONONUCLEAR NO ORGANISMS SEEN    Culture   Final    No growth aerobically or anaerobically. Performed at [facility], [place] [address], [place], [place] [address]    Report Status 11/18/2020 FINAL  Final  Culture, respiratory     Status: None   Collection Time: 11/17/20 11:48 AM   Specimen: Bronchoalveolar Lavage; Respiratory  Result Value Ref Range Status   Specimen Description   Final    BRONCHIAL ALVEOLAR LAVAGE Performed at [place] [address], [place], [place] [address]    Special Requests   Final    NONE Performed at [facility], [place] [address] [place], [place] [address]    Gram Stain    Final    RARE WBC PRESENT, PREDOMINANTLY PMN NO ORGANISMS SEEN    Culture   Final    NO GROWTH 2 DAYS Performed at [place] [address] [place], [place] [address]    Report Status 11/19/2020 FINAL  Final  Acid Fast Smear (AFB)     Status: None   Collection Time: 11/17/20 11:48 AM   Specimen: Bronchial Alveolar Lavage  Result Value Ref Range Status   AFB Specimen Processing Concentration  Final   Acid Fast Smear Negative  Final    Comment: (NOTE) Performed At: [facility] [place], [place] [id number] Rush Farmer MD UO:[id number]    Source (AFB) BRONCHIAL ALVEOLAR LAVAGE  Final    Comment: Performed at [facility], [place] [address] [place], [place] [address]    Studies/Results: No results found.    Assessment/Plan:  Oxygen requirements have come down to 3 L via nasal cannula  Principal Problem:   HCAP (healthcare-associated pneumonia) Active Problems:   HTN (hypertension)   Diabetes mellitus without complication (HCC)   Hyperlipidemia   BPH (benign prostatic hyperplasia)   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HCC)   S/P bronchoscopy   Sepsis ([place])  Fluid collection at surgical site   Hypoxia   Wound infection   Acute respiratory failure with hypoxia (HCC)   DVT (deep venous thrombosis) (HCC)   SOB (shortness of breath)   Eosinophilic pneumonia (HCC)    ALOYSIUS Brown is a 75 y.o. 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  He then had loosening of screws and had them removed by Dr. Louanne Skye due to continuous pain at that site.  There is no purulence but culture did yield a Staph epidermidis.  He was placed on oral Bactrim and then seen by my partner Dr. Gale Journey who placed him on IV daptomycin with stop date due in a few days.  He has now been admitted with extensive right-sided  pneumonia.  He continued to complain of back pain that is not improved despite IV antibiotics.  He did not have resolution of his pneumonia with appropriate abx and he has now had bronchoscopy and steroids have been started for possible eosinophilic PNA from daptomycin.  #1 Pneumonia: Seems to be eosinophilic PNA from daptomycin  He has had nearly 10  days of antimicrobial therapy  Bronchoscopy failed to reveal any organisms. Path unrevealing  He has been started on steroids for possibleeosinophilic pneumonia due to daptomycin .   #2  Hardware associated thoracic spine infection: MRI is reassuring inflammatory markers are up but also could be due to his pneumonia.  We switched to bactrim DS BID (which will also provide PJP prevention)  #3 DVT associated with PICC: on anticoagulation and PICC removed  Luis Brown has an appointment on 12/04/20 at 245 PM with Dr. Tommy Medal  He should arrive 15-30 minutes prior to his appointment.  The [facility] is located in the [facility] at  [place] in [place].  [address], which is located to the left of the elevators.  Phone: [phone]  Fax: [phone]  [email or web address]  I spent greater than 35  minutes with the patient including greater than 50% of time in face to face counsel of the patient and in coordination of his care.   I will sign off for now please call with further questions.     LOS: 9 days   Alcide Evener 11/20/2020, 4:56 PM

## 2020-11-20 NOTE — Progress Notes (Incomplete)
Inpatient Diabetes Program Recommendations  AACE/ADA: New Consensus Statement on Inpatient Glycemic Control (2015)  Target Ranges:  Prepandial:   less than 140 mg/dL      Peak postprandial:   less than 180 mg/dL (1-2 hours)      Critically ill patients:  140 - 180 mg/dL   Lab Results  Component Value Date   GLUCAP 236 (H) 11/20/2020   HGBA1C 6.9 (H) 11/18/2020    Review of Glycemic Control  Diabetes history: Prediabetes Outpatient Diabetes medications: *** Current orders for Inpatient glycemic control: ***  Inpatient Diabetes Program Recommendations:

## 2020-11-20 NOTE — Telephone Encounter (Signed)
Triage  Patient seen in the hospital on 11/20/2020 for drug-induced pneumonitis hypoxic respiratory failure.  Plan - Given follow-up with nurse practitioner in 7- 14 days -Give 30-minute follow-up with Dr. Chase Caller as well in June/July 2022

## 2020-11-20 NOTE — Progress Notes (Signed)
Progress Note  DEVIAN Brown   TZG:[id number]  DOB: April 25, 1946  DOA: 11/10/2020     9  PCP: Elise Benne  CC: SOB  Hospital Course: [name] is a 75 yo male with PMH vertebral OM (was on 6 wk Dapto course PTA), peripheral neuropathy, HLD, HTN, DMII, DDD, BPH, anxiety/depression who presented to the hospital with SOB.  He initially presented to his PCPs office and was found to be hypoxic.  He was referred to the ER.  He underwent work-up with CTA chest which showed a large right sided PNA with ground glass nodules and concern for central cavitation.  He was started on IV antibiotics on admission.  Infectious disease was consulted given prolonged antibiotic course at home and now with atypical pneumonia.  Blood cultures were obtained on admission.  Repeat MRI T-spine and echo were also ordered. The echo was negative for valvular vegetation.  MRI was negative for deep infection but did reveal a subcutaneous probable seroma.  IR was consulted per ID for aspiration/drainage and culturing of fluid.  He underwent aspiration on 11/13/2020.  Fluid studies were sent for culture and did not grow (appearance was also non-infectious).  He had some swelling and irritation associated with his PICC line.  He underwent right upper extremity duplex which was positive for DVT.  He was started on heparin drip on 11/16/2020 and PICC line was removed. Due to worsening respiratory symptoms, increasing oxygen requirements, he underwent repeat CTA chest.  This showed worsening right-sided pneumonia.  Pulmonology was consulted and he also underwent bronchoscopy on 11/17/2020.   Review of systems/interval History:  No acute issues or events overnight denies nausea vomiting diarrhea constipation headache fevers or chills.  Still somewhat anxious about discharge status but we discussed focusing on what he can change which is activity levels and working with incentive spirometry and flutter.  Assessment & Plan:  CAP  (community acquired pneumonia), resolving - CAP with higher risk MDRO due to prolonged IV abx (dapto) and recent exposure to healthcare - Completed 10 days of abx w/  - CTA read on 4/26 suggests right sided cavitary lesions - Follow up blood cultures remain negative - MRI T-spine shows probable seroma; IR consulted per ID - aspiration/drainage on 11/13/20 (3cc drained, non infected appearing)  - Repeat CTA on 5/1 shows worsening right sided PNA, small effusion, no PE - Pulmonology consulted 5/2. Patient underwent bronch 5/3. Follow up bronch studies fairly unremarkable  DVT (deep venous thrombosis) of the upper extremity ([place]) - RUE pain on 11/16/2020 - US shows acute DVT noted in right axillary and brachial veins - PICC removed  - Heparin transition to xarelto on 11/19/20  Acute respiratory failure with hypoxia ([place]) - New as of 4/30.  - Possibly evolving PNA/effusion per CT (PE negative) - Continue incentive spirometer and flutter - Continue to wean oxygen as tolerated - on 6L today at 93% - wean to goal of 90-94% - Patient educated on the importance of ambulation and incentive spirometry and flutter  Sepsis ([place]), POA, multifactorial secondary to CAP and Osteo - tachycardia, tachypnea, pneumonia source - see CAP/osteomyelitis  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[place]) - s/p removal of hardware of T5, T6 pedicle screws/rods, exploration of fusion on 09/14/20 - diagnosed with hardware associated Staph Epi OM. Seen by ID on 09/28/20 (prior to this had also been on a prolonged abx course for prior infection). Plan was: 6 weeks Dapto followed by Bactrim up to 3 months if tolerates -  s ID following; continue on Vanc/cefepime for now while in hospital  - repeat MRI T-spine 11/11/20: no epidural abscess. New fluid collection in SQ tissue T3-4 to T6-7 measuring 6 cm, considered a seroma (to be aspirated for culture on 4/29) -Will transition vancomycin to Bactrim once pneumonia further  treated  BPH (benign prostatic hyperplasia) - Continue Flomax  Hyperlipidemia - Continue Lipitor  Diabetes mellitus without complication ([place]) - continue SSI and CBG monitoring  - last A1c 6.3% on 09/08/20  HTN (hypertension) - Continue Avapro  Hyponatremia-resolved as of 11/11/2020 - Na 130 on admission; suspected hypovolemia - improved  Antimicrobials: Azithromycin 11/11/20 x 1 Vancomycin 11/10/2020 >> 11/19/20 Cefepime 11/10/2020 >> 11/19/20  Flagyl 11/11/2020 >> 11/17/20 Bactrim 11/18/20 - ongoing  DVT prophylaxis:  Code Status:   Code Status: Full Code Family Communication: wife at bedside  Disposition Plan: Status is: Inpatient  Remains inpatient appropriate because:Ongoing diagnostic testing needed not appropriate for outpatient work up, IV treatments appropriate due to intensity of illness or inability to take PO and Inpatient level of care appropriate due to severity of illness  Dispo: The patient is from: Home              Anticipated d/c is to: Home 48-72h              Patient currently is not medically stable to d/c.   Difficult to place patient No  Risk of unplanned readmission score: Unplanned Admission- Pilot do not use: 22.25   Objective: Blood pressure (!) 153/64, pulse (!) 53, temperature 98.2 F (36.8 C), temperature source Oral, resp. rate 19, height _0  (1.778 m), weight 75.9 kg, SpO2 95 %.   Examination: General appearance: Pleasant but anxious adult man resting in bed appearing comfortable and in no distress.  Does appear more fatigued and is on 6 L oxygen Head: Normocephalic, without obvious abnormality, atraumatic Eyes: EOMI Lungs: Right-sided coarse breath sounds with rhonchi.  No wheezing.  Heart: regular rate and rhythm and S1, S2 normal Abdomen: normal findings: bowel sounds normal and soft, non-tender Extremities: extremities normal, atraumatic, no cyanosis or edema Skin: mobility and turgor normal Neurologic: Grossly normal  Consultants:    ID  IR  Pulmonology  Procedures:   Thoracic fluid collection drainage with IR: 11/13/2020  Data Reviewed: I have personally reviewed following labs and imaging studies Results for orders placed or performed during the hospital encounter of 11/10/20 (from the past 24 hour(s))  Glucose, capillary     Status: Abnormal   Collection Time: 11/19/20  8:05 AM  Result Value Ref Range   Glucose-Capillary 199 (H) 70 - 99 mg/dL  Glucose, capillary     Status: Abnormal   Collection Time: 11/19/20 11:04 AM  Result Value Ref Range   Glucose-Capillary 321 (H) 70 - 99 mg/dL  Glucose, capillary     Status: Abnormal   Collection Time: 11/19/20  4:23 PM  Result Value Ref Range   Glucose-Capillary 272 (H) 70 - 99 mg/dL  Glucose, capillary     Status: Abnormal   Collection Time: 11/19/20  7:55 PM  Result Value Ref Range   Glucose-Capillary 319 (H) 70 - 99 mg/dL  Glucose, capillary     Status: Abnormal   Collection Time: 11/19/20 11:57 PM  Result Value Ref Range   Glucose-Capillary 264 (H) 70 - 99 mg/dL  Glucose, capillary     Status: Abnormal   Collection Time: 11/20/20  3:51 AM  Result Value Ref Range   Glucose-Capillary 221 (H)  70 - 99 mg/dL  Basic metabolic panel     Status: Abnormal   Collection Time: 11/20/20  3:52 AM  Result Value Ref Range   Sodium 131 (L) 135 - 145 mmol/L   Potassium 4.0 3.5 - 5.1 mmol/L   Chloride 98 98 - 111 mmol/L   CO2 23 22 - 32 mmol/L   Glucose, Bld 235 (H) 70 - 99 mg/dL   BUN 23 8 - 23 mg/dL   Creatinine, Ser 0.58 (L) 0.61 - 1.24 mg/dL   Calcium 8.2 (L) 8.9 - 10.3 mg/dL   GFR, Estimated >60 >60 mL/min   Anion gap 10 5 - 15  CBC with Differential/Platelet     Status: Abnormal   Collection Time: 11/20/20  3:52 AM  Result Value Ref Range   WBC 16.9 (H) 4.0 - 10.5 K/uL   RBC 3.70 (L) 4.22 - 5.81 MIL/uL   Hemoglobin 11.0 (L) 13.0 - 17.0 g/dL   HCT 34.2 (L) 39.0 - 52.0 %   MCV 92.4 80.0 - 100.0 fL   MCH 29.7 26.0 - 34.0 pg   MCHC 32.2 30.0 - 36.0 g/dL    RDW 14.2 11.5 - 15.5 %   Platelets 323 150 - 400 K/uL   nRBC 0.0 0.0 - 0.2 %   Neutrophils Relative % 91 %   Neutro Abs 15.4 (H) 1.7 - 7.7 K/uL   Lymphocytes Relative 4 %   Lymphs Abs 0.6 (L) 0.7 - 4.0 K/uL   Monocytes Relative 3 %   Monocytes Absolute 0.5 0.1 - 1.0 K/uL   Eosinophils Relative 0 %   Eosinophils Absolute 0.0 0.0 - 0.5 K/uL   Basophils Relative 0 %   Basophils Absolute 0.0 0.0 - 0.1 K/uL   Immature Granulocytes 2 %   Abs Immature Granulocytes 0.36 (H) 0.00 - 0.07 K/uL  Magnesium     Status: None   Collection Time: 11/20/20  3:52 AM  Result Value Ref Range   Magnesium 2.0 1.7 - 2.4 mg/dL   *Note: Due to a large number of results and/or encounters for the requested time period, some results have not been displayed. A complete set of results can be found in Results Review.    Recent Results (from the past 240 hour(s))  Resp Panel by RT-PCR (Flu A&B, Covid) Nasopharyngeal Swab     Status: None   Collection Time: 11/10/20  4:54 PM   Specimen: Nasopharyngeal Swab; Nasopharyngeal(NP) swabs in vial transport medium  Result Value Ref Range Status   SARS Coronavirus 2 by RT PCR NEGATIVE NEGATIVE Final    Comment: (NOTE) SARS-CoV-2 target nucleic acids are NOT DETECTED.  The SARS-CoV-2 RNA is generally detectable in upper respiratory specimens during the acute phase of infection. The lowest concentration of SARS-CoV-2 viral copies this assay can detect is 138 copies/mL. A negative result does not preclude SARS-Cov-2 infection and should not be used as the sole basis for treatment or other patient management decisions. A negative result may occur with  improper specimen collection/handling, submission of specimen other than nasopharyngeal swab, presence of viral mutation(s) within the areas targeted by this assay, and inadequate number of viral copies(<138 copies/mL). A negative result must be combined with clinical observations, patient history, and  epidemiological information. The expected result is Negative.  Fact Sheet for Patients:  EntrepreneurPulse.com.au  Fact Sheet for Healthcare Providers:  IncredibleEmployment.be  This test is no t yet approved or cleared by the [place] FDA and  has been authorized for detection and/or  diagnosis of SARS-CoV-2 by FDA under an Emergency Use Authorization (EUA). This EUA will remain  in effect (meaning this test can be used) for the duration of the COVID-19 declaration under Section 564(b)(1) of the Act, 21 U.S.C.section 360bbb-3(b)(1), unless the authorization is terminated  or revoked sooner.       Influenza A by PCR NEGATIVE NEGATIVE Final   Influenza B by PCR NEGATIVE NEGATIVE Final    Comment: (NOTE) The Xpert Xpress SARS-CoV-2/FLU/RSV plus assay is intended as an aid in the diagnosis of influenza from Nasopharyngeal swab specimens and should not be used as a sole basis for treatment. Nasal washings and aspirates are unacceptable for Xpert Xpress SARS-CoV-2/FLU/RSV testing.  Fact Sheet for Patients: EntrepreneurPulse.com.au  Fact Sheet for Healthcare Providers: IncredibleEmployment.be  This test is not yet approved or cleared by the [place] FDA and has been authorized for detection and/or diagnosis of SARS-CoV-2 by FDA under an Emergency Use Authorization (EUA). This EUA will remain in effect (meaning this test can be used) for the duration of the COVID-19 declaration under Section 564(b)(1) of the Act, 21 U.S.C. section 360bbb-3(b)(1), unless the authorization is terminated or revoked.  Performed at [facility], [address] [place], [place] [address]   Culture, blood (routine x 2)     Status: None   Collection Time: 11/10/20  6:04 PM   Specimen: BLOOD  Result Value Ref Range Status   Specimen Description   Final    BLOOD PICC LINE Performed at [facility],  [address] [place], [place] [address]    Special Requests   Final    BOTTLES DRAWN AEROBIC AND ANAEROBIC Blood Culture adequate volume Performed at [facility], [address] [place], [place] [address]    Culture   Final    NO GROWTH 5 DAYS Performed at [facility], [place] [address], [place], [place] [address]    Report Status 11/16/2020 FINAL  Final  Culture, blood (routine x 2)     Status: None   Collection Time: 11/10/20  6:05 PM   Specimen: BLOOD LEFT ARM  Result Value Ref Range Status   Specimen Description   Final    BLOOD LEFT ARM Performed at [facility], [address] [place], [place] [address]    Special Requests   Final    BOTTLES DRAWN AEROBIC AND ANAEROBIC Blood Culture adequate volume Performed at [facility], [address], [place], [place] [address]    Culture   Final    NO GROWTH 5 DAYS Performed at [facility], [place] [address], [place], [place] [address]    Report Status 11/16/2020 FINAL  Final  Respiratory (~20 pathogens) panel by PCR     Status: None   Collection Time: 11/12/20  1:51 PM  Result Value Ref Range Status   Adenovirus NOT DETECTED NOT DETECTED Final   Coronavirus 229E NOT DETECTED NOT DETECTED Final    Comment: (NOTE) The Coronavirus on the Respiratory Panel, DOES NOT test for the novel  Coronavirus (2019 nCoV)    Coronavirus HKU1 NOT DETECTED NOT DETECTED Final   Coronavirus NL63 NOT DETECTED NOT DETECTED Final   Coronavirus OC43 NOT DETECTED NOT DETECTED Final   Metapneumovirus NOT DETECTED NOT DETECTED Final   Rhinovirus / Enterovirus NOT DETECTED NOT DETECTED Final   Influenza A NOT DETECTED NOT DETECTED Final   Influenza B NOT DETECTED NOT DETECTED  Final   Parainfluenza Virus 1 NOT DETECTED NOT DETECTED Final   Parainfluenza Virus 2 NOT DETECTED NOT DETECTED Final   Parainfluenza Virus 3 NOT DETECTED NOT DETECTED Final   Parainfluenza Virus 4 NOT DETECTED NOT DETECTED Final    Respiratory Syncytial Virus NOT DETECTED NOT DETECTED Final   Bordetella pertussis NOT DETECTED NOT DETECTED Final   Bordetella Parapertussis NOT DETECTED NOT DETECTED Final   Chlamydophila pneumoniae NOT DETECTED NOT DETECTED Final   Mycoplasma pneumoniae NOT DETECTED NOT DETECTED Final    Comment: Performed at [facility], [place] [address], [place], [place] [address]  Aerobic/Anaerobic Culture (surgical/deep wound)     Status: None   Collection Time: 11/13/20  8:47 AM   Specimen: Thoracic; Wound  Result Value Ref Range Status   Specimen Description   Final    THORACIC T5 T6 Performed at [place] [address], [place], [place] [address]    Special Requests   Final    Normal Performed at [facility], [place] [address], [place], [place] [address]    Gram Stain   Final    FEW WBC PRESENT,BOTH PMN AND MONONUCLEAR NO ORGANISMS SEEN    Culture   Final    No growth aerobically or anaerobically. Performed at [facility], [place] [address], [place], [place] [address]    Report Status 11/18/2020 FINAL  Final  Culture, respiratory     Status: None   Collection Time: 11/17/20 11:48 AM   Specimen: Bronchoalveolar Lavage; Respiratory  Result Value Ref Range Status   Specimen Description   Final    BRONCHIAL ALVEOLAR LAVAGE Performed at [place] [address], [place], [place] [address]    Special Requests   Final    NONE Performed at [facility], [place] [address] [place], [place] [address]    Gram Stain   Final    RARE WBC PRESENT, PREDOMINANTLY PMN NO ORGANISMS SEEN    Culture   Final    NO GROWTH 2 DAYS Performed at [place] [address] [place], [place] [address]    Report Status 11/19/2020 FINAL  Final  Acid Fast Smear (AFB)     Status: None   Collection Time: 11/17/20 11:48 AM   Specimen: Bronchial Alveolar Lavage  Result Value Ref Range Status   AFB Specimen  Processing Concentration  Final   Acid Fast Smear Negative  Final    Comment: (NOTE) Performed At: [facility] [place], [place] [id number] Rush Farmer MD MC:[id number]    Source (AFB) BRONCHIAL ALVEOLAR LAVAGE  Final    Comment: Performed at [facility], [place] [address], [place],  [address]     Radiology Studies: No results found. DG CHEST PORT 1 VIEW  Final Result    DG C-ARM BRONCHOSCOPY  Final Result    VAS [place] UPPER EXTREMITY VENOUS DUPLEX  Final Result    CT ANGIO CHEST PE W OR WO CONTRAST  Final Result    IR US Guide Bx Asp/Drain  Final Result    MR THORACIC SPINE W WO CONTRAST  Final Result    CT Angio Chest PE W/Cm &/Or Wo Cm  Final Result    DG Chest Port 1 View  Final Result      Scheduled Meds: . atorvastatin  10 mg Oral Daily  . busPIRone  7.5 mg Oral BID  . Chlorhexidine Gluconate Cloth  6  each Topical Daily  . dextromethorphan-guaiFENesin  1 tablet Oral BID  . famotidine  20 mg Oral Daily  . Ferrous Fumarate  1 tablet Oral Daily  . insulin aspart  0-20 Units Subcutaneous Q4H  . ipratropium-albuterol  3 mL Nebulization TID  . irbesartan  37.5 mg Oral Daily  . methylPREDNISolone (SOLU-MEDROL) injection  125 mg Intravenous Q6H  . pantoprazole  40 mg Oral BID AC  . pregabalin  75 mg Oral BID  . rivaroxaban   Does not apply Once  . Rivaroxaban  15 mg Oral BID WC   Followed by  . [START ON 12/10/2020] rivaroxaban  20 mg Oral Q supper  . sucralfate  1 g Oral TID WC & HS  . sulfamethoxazole-trimethoprim  1 tablet Oral Q12H  . tamsulosin  0.8 mg Oral Daily  . venlafaxine XR  150 mg Oral Q breakfast   PRN Meds: acetaminophen, ALPRAZolam, hydrALAZINE, HYDROcodone-acetaminophen, ipratropium-albuterol, labetalol, morphine injection, sodium chloride, sodium chloride flush, tiZANidine, zolpidem Continuous Infusions: . lactated ringers 10 mL/hr at 11/17/20 1106     LOS: 9 days  Time spent: 35  min  Little Ishikawa, DO [facility] 11/20/2020, 7:39 AM

## 2020-11-20 NOTE — Progress Notes (Signed)
NAME:  Luis Brown, MRN:  [id number], DOB:  [birth year], LOS: 9 ADMISSION DATE:  11/10/2020, CONSULTATION DATE:  11/16/2020 REFERRING MD:  Dr Cleophas Dunker, CHIEF COMPLAINT: Pneumonia  History of Present Illness:  75 y/o M admitted 4/26 with several week hx of worsening SOB.    He was home on antibiotics (daptomycin) for osteomyelitis. The patient was noted to have shortness of breath, diaphoresis & hypoxemic in the office and was referred to the emergency department for evaluation.  He was admitted per [facility] and felt as if his SOB became worse after admission.  He was started on empiric antibiotics for pneumonia. However, his breathing continued to worsen, had a repeat CT scan showing progressive infiltration in the right lung.    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 4/26 Admit  . 5/03 FOB . 5/04 O2 at 6L, pt reports improvement in dyspnea related anxiety with xanax  . 5/06 O2 weaned to 3L at rest, desaturates with exertion into 70's  Interim History / Subjective:  O2 weaned to 3L  RN reports pt desaturates into 70's with exertion  Son at bedside   Objective   Blood pressure (!) 152/82, pulse 94, temperature 97.9 F (36.6 C), resp. rate 15, height _0  (1.778 m), weight 75.9 kg, SpO2 90 %.        Intake/Output Summary (Last 24 hours) at 11/20/2020 1530 Last data filed at 11/20/2020 1427 Gross per 24 hour  Intake 520 ml  Output 2675 ml  Net -2155 ml   Filed Weights   11/10/20 2230  Weight: 75.9 kg    Examination: General: adult male sitting up in bed in NAD HEENT: MM pink/moist, good dentition, face  flushed Neuro: AAOx4, speech clear, AME  CV: s1s2 RRR, no m/r/g PULM: non-labored on 3L [place] O2 at rest, crackles on right  GI: soft, bsx4 active  Extremities: warm/dry, no edema  Skin: no rashes or lesions  Labs/imaging that I havepersonally reviewed  (right click and "Reselect all SmartList Selections" daily)  Prior CT imaging O2 needs  Prior CBC, BMP Bronchoscopy findings - negative pathology / washings, 72 TNC on BAL   Resolved Hospital Problem list     Assessment & Plan:   Progressive Right Sided Pulmonary Infiltrates  Pleural effusion Pulmonary Nodules (20m, 750m2021 imaging) He was on previously on daptomycin for hardware infection via PICC line.  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  S/p FOB on 5/3 with negative pathology, eosinophils not completed. Note hx of prior pulmonary nodules and tobacco abuse.  No subjective hx of aspiration, difficulty swallowing or choking. Differential includes eosinophilic PNA in setting of dapto use, aspiration with PNA/CAP, less likely TB, must also consider malignancy with smoking hx and nodules.  -BAL negative  -continue steroids, plan for transition to 60 mg QD PO as of 5/7 am  -wean O2 for sats >90% -plan for CXR am 5/9 (ordered) -will need follow up CT imaging as outpatient  -pulmonary follow up as outpatient   Upper extremity DVT  -defer anticoagulation to primary   Subacute osteomyelitis of the thoracic spine BPH Hyperlipidemia -per primary  Noe Gens, MSN, APRN, NP-C, AGACNP-BC [facility] 11/20/2020, 3:30 PM   Please see [email or web address] for pager details.   From 7A-7P if no response, please call [phone] After hours, please call ELink [phone]

## 2020-11-20 NOTE — Care Management Important Message (Signed)
Important Message  Patient Details IM Letter given to the Patient. Name: Luis Brown MRN: [id number] Date of Birth: 1946-05-25   Medicare Important Message Given:  Yes     Kerin Salen 11/20/2020, 12:29 PM

## 2020-11-21 DIAGNOSIS — J9601 Acute respiratory failure with hypoxia: Secondary | ICD-10-CM | POA: Diagnosis not present

## 2020-11-21 DIAGNOSIS — J189 Pneumonia, unspecified organism: Secondary | ICD-10-CM | POA: Diagnosis not present

## 2020-11-21 DIAGNOSIS — N400 Enlarged prostate without lower urinary tract symptoms: Secondary | ICD-10-CM | POA: Diagnosis not present

## 2020-11-21 DIAGNOSIS — E119 Type 2 diabetes mellitus without complications: Secondary | ICD-10-CM | POA: Diagnosis not present

## 2020-11-21 LAB — BASIC METABOLIC PANEL
Anion gap: 9 (ref 5–15)
BUN: 22 mg/dL (ref 8–23)
CO2: 25 mmol/L (ref 22–32)
Calcium: 8.3 mg/dL — ABNORMAL LOW (ref 8.9–10.3)
Chloride: 97 mmol/L — ABNORMAL LOW (ref 98–111)
Creatinine, Ser: 0.69 mg/dL (ref 0.61–1.24)
GFR, Estimated: >60 mL/min (ref >60)
Glucose, Bld: 222 mg/dL — ABNORMAL HIGH (ref 70–99)
Potassium: 3.9 mmol/L (ref 3.5–5.1)
Sodium: 131 mmol/L — ABNORMAL LOW (ref 135–145)

## 2020-11-21 LAB — GLUCOSE, CAPILLARY
Glucose-Capillary: 236 mg/dL — ABNORMAL HIGH (ref 70–99)
Glucose-Capillary: 199 mg/dL — ABNORMAL HIGH (ref 70–99)
Glucose-Capillary: 312 mg/dL — ABNORMAL HIGH (ref 70–99)
Glucose-Capillary: 280 mg/dL — ABNORMAL HIGH (ref 70–99)
Glucose-Capillary: 220 mg/dL — ABNORMAL HIGH (ref 70–99)
Glucose-Capillary: 188 mg/dL — ABNORMAL HIGH (ref 70–99)

## 2020-11-21 LAB — CBC WITH DIFFERENTIAL/PLATELET
Abs Immature Granulocytes: 0.31 10*3/uL — ABNORMAL HIGH (ref 0.00–0.07)
Basophils Absolute: 0 10*3/uL (ref 0.0–0.1)
Basophils Relative: 0 %
Eosinophils Absolute: 0 10*3/uL (ref 0.0–0.5)
Eosinophils Relative: 0 %
HCT: 34.9 % — ABNORMAL LOW (ref 39.0–52.0)
Hemoglobin: 11.4 g/dL — ABNORMAL LOW (ref 13.0–17.0)
Immature Granulocytes: 2 %
Lymphocytes Relative: 4 %
Lymphs Abs: 0.6 10*3/uL — ABNORMAL LOW (ref 0.7–4.0)
MCH: 30.2 pg (ref 26.0–34.0)
MCHC: 32.7 g/dL (ref 30.0–36.0)
MCV: 92.6 fL (ref 80.0–100.0)
Monocytes Absolute: 0.6 10*3/uL (ref 0.1–1.0)
Monocytes Relative: 4 %
Neutro Abs: 13 10*3/uL — ABNORMAL HIGH (ref 1.7–7.7)
Neutrophils Relative %: 90 %
Platelets: 360 10*3/uL (ref 150–400)
RBC: 3.77 MIL/uL — ABNORMAL LOW (ref 4.22–5.81)
RDW: 14.5 % (ref 11.5–15.5)
WBC: 14.6 10*3/uL — ABNORMAL HIGH (ref 4.0–10.5)
nRBC: 0 % (ref 0.0–0.2)

## 2020-11-21 LAB — MAGNESIUM: Magnesium: 2.2 mg/dL (ref 1.7–2.4)

## 2020-11-21 LAB — QUANTIFERON-TB GOLD PLUS (RQFGPL)
QuantiFERON Mitogen Value: 2.73 IU/mL
QuantiFERON Nil Value: 0 IU/mL
QuantiFERON TB1 Ag Value: 0 IU/mL
QuantiFERON TB2 Ag Value: 0 IU/mL

## 2020-11-21 LAB — QUANTIFERON-TB GOLD PLUS: QuantiFERON-TB Gold Plus: NEGATIVE

## 2020-11-21 NOTE — Plan of Care (Signed)
  Problem: Clinical Measurements: Goal: Will remain free from infection Outcome: Progressing   Problem: Coping: Goal: Level of anxiety will decrease Outcome: Progressing   Problem: Clinical Measurements: Goal: Cardiovascular complication will be avoided Outcome: Progressing   Problem: Pain Managment: Goal: General experience of comfort will improve Outcome: Progressing   Problem: Safety: Goal: Ability to remain free from injury will improve Outcome: Progressing

## 2020-11-21 NOTE — Progress Notes (Signed)
Progress Note  Luis Brown   YQM:[id number]  DOB: 1946-02-07  DOA: 11/10/2020     10  PCP: Elise Benne  CC: SOB  Hospital Course: [name] is a 75 yo male with PMH vertebral OM (was on 6 wk Dapto course PTA), peripheral neuropathy, HLD, HTN, DMII, DDD, BPH, anxiety/depression who presented to the hospital with SOB.  He initially presented to his PCPs office and was found to be hypoxic.  He was referred to the ER.  He underwent work-up with CTA chest which showed a large right sided PNA with ground glass nodules and concern for central cavitation.  He was started on IV antibiotics on admission.  Infectious disease was consulted given prolonged antibiotic course at home and now with atypical pneumonia.  Blood cultures were obtained on admission.  Repeat MRI T-spine and echo were also ordered. The echo was negative for valvular vegetation.  MRI was negative for deep infection but did reveal a subcutaneous probable seroma.  IR was consulted per ID for aspiration/drainage and culturing of fluid.  He underwent aspiration on 11/13/2020.  Fluid studies were sent for culture and did not grow (appearance was also non-infectious).  He had some swelling and irritation associated with his PICC line.  He underwent right upper extremity duplex which was positive for DVT.  He was started on heparin drip on 11/16/2020 and PICC line was removed. Due to worsening respiratory symptoms, increasing oxygen requirements, he underwent repeat CTA chest.  This showed worsening right-sided pneumonia.  Pulmonology was consulted and he also underwent bronchoscopy on 11/17/2020.   Review of systems/interval History:  No acute issues or events overnight denies nausea vomiting diarrhea constipation headache fevers or chills.  Still somewhat anxious about discharge status but we discussed focusing on what he can change which is activity levels and working with incentive spirometry and flutter.  Assessment &  Plan:  CAP (community acquired pneumonia), resolving - CAP with higher risk MDRO due to prolonged IV abx (dapto) and recent exposure to healthcare - Completed 10 days of abx w/  - CTA read on 4/26 suggests right sided cavitary lesions - Follow up blood cultures remain negative - MRI T-spine shows probable seroma; IR consulted per ID - aspiration/drainage on 11/13/20 (3cc drained, non infected appearing)  - Repeat CTA on 5/1 shows worsening right sided PNA, small effusion, no PE - Pulmonology consulted 5/2. Patient underwent bronch 5/3. Follow up bronch studies fairly unremarkable  DVT (deep venous thrombosis) of the upper extremity ([place]), likely provoked by PICC line - RUE pain on 11/16/2020 - US shows acute DVT noted in right axillary and brachial veins - PICC removed  - Heparin transition to xarelto on 11/19/20  Acute respiratory failure with hypoxia (Luis Brown), resolving - New as of 4/30.  - Possibly evolving PNA/effusion per CT (PE negative) - Continue incentive spirometer and flutter - Continue to wean oxygen as tolerated - on RA today at rest sats 86%; 3L [place] replaced - sats rebounded to 97% immediately - wean to goal of 90-94% - Ambulatory walk screening ongoing - Patient educated on the importance of ambulation and incentive spirometry and flutter  Sepsis (Luis Brown), POA, multifactorial secondary to CAP and Osteo - tachycardia, tachypnea, pneumonia source - see CAP/osteomyelitis  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Luis Brown) - s/p removal of hardware of T5, T6 pedicle screws/rods, exploration of fusion on 09/14/20 - diagnosed with hardware associated Staph Epi OM. Seen by ID on 09/28/20 (prior to this had also been on  a prolonged abx course for prior infection). Plan was: 6 weeks Dapto followed by Bactrim up to 3 months if tolerates - s ID following; continue on Vanc/cefepime for now while in hospital  - repeat MRI T-spine 11/11/20: no epidural abscess. New fluid collection in SQ tissue T3-4 to  T6-7 measuring 6 cm, considered a seroma (to be aspirated for culture on 4/29) -Will transition vancomycin to Bactrim once pneumonia further treated  BPH (benign prostatic hyperplasia) - Continue Flomax  Hyperlipidemia - Continue Lipitor  Diabetes mellitus without complication (HCC) - Continue SSI and CBG monitoring  - Last A1c 6.3% on 09/08/20  HTN (hypertension) - Continue Avapro  Hyponatremia-resolved as of 11/11/2020 - Na 130 on admission; suspected hypovolemia - improved  Antimicrobials: Azithromycin 11/11/20 x 1 Vancomycin 11/10/2020 >> 11/19/20 Cefepime 11/10/2020 >> 11/19/20  Flagyl 11/11/2020 >> 11/17/20 Bactrim 11/18/20 - ongoing  DVT prophylaxis:  Code Status:   Code Status: Full Code Family Communication: wife at bedside  Disposition Plan: Status is: Inpatient  Remains inpatient appropriate because:Ongoing diagnostic testing needed not appropriate for outpatient work up, IV treatments appropriate due to intensity of illness or inability to take PO and Inpatient level of care appropriate due to severity of illness  Dispo: The patient is from: Home              Anticipated d/c is to: Home 48-72h              Patient currently is not medically stable to d/c.   Difficult to place patient No  Risk of unplanned readmission score: Unplanned Admission- Pilot do not use: 22.55   Objective: Blood pressure 124/67, pulse (!) 58, temperature (!) 97.4 F (36.3 C), temperature source Oral, resp. rate (!) 21, height _0  (1.778 m), weight 75.9 kg, SpO2 96 %.   Examination: General appearance: Pleasant but anxious adult man resting in bed appearing comfortable and in no distress.  Does appear more fatigued and is on 6 L oxygen Head: Normocephalic, without obvious abnormality, atraumatic Eyes: EOMI Lungs: Right-sided coarse breath sounds with rhonchi.  No wheezing.  Heart: regular rate and rhythm and S1, S2 normal Abdomen: normal findings: bowel sounds normal and soft,  non-tender Extremities: extremities normal, atraumatic, no cyanosis or edema Skin: mobility and turgor normal Neurologic: Grossly normal  Consultants:   ID  IR  Pulmonology  Procedures:   Thoracic fluid collection drainage with IR: 11/13/2020  Data Reviewed: I have personally reviewed following labs and imaging studies Results for orders placed or performed during the hospital encounter of 11/10/20 (from the past 24 hour(s))  Glucose, capillary     Status: Abnormal   Collection Time: 11/20/20  8:01 AM  Result Value Ref Range   Glucose-Capillary 202 (H) 70 - 99 mg/dL  Glucose, capillary     Status: Abnormal   Collection Time: 11/20/20 12:00 PM  Result Value Ref Range   Glucose-Capillary 236 (H) 70 - 99 mg/dL  Glucose, capillary     Status: Abnormal   Collection Time: 11/20/20  4:37 PM  Result Value Ref Range   Glucose-Capillary 289 (H) 70 - 99 mg/dL  Glucose, capillary     Status: Abnormal   Collection Time: 11/20/20  7:56 PM  Result Value Ref Range   Glucose-Capillary 293 (H) 70 - 99 mg/dL  Glucose, capillary     Status: Abnormal   Collection Time: 11/20/20 11:39 PM  Result Value Ref Range   Glucose-Capillary 307 (H) 70 - 99 mg/dL  Glucose, capillary  Status: Abnormal   Collection Time: 11/21/20  3:56 AM  Result Value Ref Range   Glucose-Capillary 236 (H) 70 - 99 mg/dL  Basic metabolic panel     Status: Abnormal   Collection Time: 11/21/20  4:51 AM  Result Value Ref Range   Sodium 131 (L) 135 - 145 mmol/L   Potassium 3.9 3.5 - 5.1 mmol/L   Chloride 97 (L) 98 - 111 mmol/L   CO2 25 22 - 32 mmol/L   Glucose, Bld 222 (H) 70 - 99 mg/dL   BUN 22 8 - 23 mg/dL   Creatinine, Ser 0.69 0.61 - 1.24 mg/dL   Calcium 8.3 (L) 8.9 - 10.3 mg/dL   GFR, Estimated >60 >60 mL/min   Anion gap 9 5 - 15  CBC with Differential/Platelet     Status: Abnormal   Collection Time: 11/21/20  4:51 AM  Result Value Ref Range   WBC 14.6 (H) 4.0 - 10.5 K/uL   RBC 3.77 (L) 4.22 - 5.81 MIL/uL    Hemoglobin 11.4 (L) 13.0 - 17.0 g/dL   HCT 34.9 (L) 39.0 - 52.0 %   MCV 92.6 80.0 - 100.0 fL   MCH 30.2 26.0 - 34.0 pg   MCHC 32.7 30.0 - 36.0 g/dL   RDW 14.5 11.5 - 15.5 %   Platelets 360 150 - 400 K/uL   nRBC 0.0 0.0 - 0.2 %   Neutrophils Relative % 90 %   Neutro Abs 13.0 (H) 1.7 - 7.7 K/uL   Lymphocytes Relative 4 %   Lymphs Abs 0.6 (L) 0.7 - 4.0 K/uL   Monocytes Relative 4 %   Monocytes Absolute 0.6 0.1 - 1.0 K/uL   Eosinophils Relative 0 %   Eosinophils Absolute 0.0 0.0 - 0.5 K/uL   Basophils Relative 0 %   Basophils Absolute 0.0 0.0 - 0.1 K/uL   Immature Granulocytes 2 %   Abs Immature Granulocytes 0.31 (H) 0.00 - 0.07 K/uL  Magnesium     Status: None   Collection Time: 11/21/20  4:51 AM  Result Value Ref Range   Magnesium 2.2 1.7 - 2.4 mg/dL   *Note: Due to a large number of results and/or encounters for the requested time period, some results have not been displayed. A complete set of results can be found in Results Review.    Recent Results (from the past 240 hour(s))  Respiratory (~20 pathogens) panel by PCR     Status: None   Collection Time: 11/12/20  1:51 PM  Result Value Ref Range Status   Adenovirus NOT DETECTED NOT DETECTED Final   Coronavirus 229E NOT DETECTED NOT DETECTED Final    Comment: (NOTE) The Coronavirus on the Respiratory Panel, DOES NOT test for the novel  Coronavirus (2019 nCoV)    Coronavirus HKU1 NOT DETECTED NOT DETECTED Final   Coronavirus NL63 NOT DETECTED NOT DETECTED Final   Coronavirus OC43 NOT DETECTED NOT DETECTED Final   Metapneumovirus NOT DETECTED NOT DETECTED Final   Rhinovirus / Enterovirus NOT DETECTED NOT DETECTED Final   Influenza A NOT DETECTED NOT DETECTED Final   Influenza B NOT DETECTED NOT DETECTED Final   Parainfluenza Virus 1 NOT DETECTED NOT DETECTED Final   Parainfluenza Virus 2 NOT DETECTED NOT DETECTED Final   Parainfluenza Virus 3 NOT DETECTED NOT DETECTED Final   Parainfluenza Virus 4 NOT DETECTED NOT  DETECTED Final   Respiratory Syncytial Virus NOT DETECTED NOT DETECTED Final   Bordetella pertussis NOT DETECTED NOT DETECTED Final   Bordetella  Parapertussis NOT DETECTED NOT DETECTED Final   Chlamydophila pneumoniae NOT DETECTED NOT DETECTED Final   Mycoplasma pneumoniae NOT DETECTED NOT DETECTED Final    Comment: Performed at [facility], [place] [address] [place], [place] [address]  Aerobic/Anaerobic Culture (surgical/deep wound)     Status: None   Collection Time: 11/13/20  8:47 AM   Specimen: Thoracic; Wound  Result Value Ref Range Status   Specimen Description   Final    THORACIC T5 T6 Performed at [facility] [address] [place], [place] [address]    Special Requests   Final    Normal Performed at [facility], [place] [address], [place], [place] [address]    Gram Stain   Final    FEW WBC PRESENT,BOTH PMN AND MONONUCLEAR NO ORGANISMS SEEN    Culture   Final    No growth aerobically or anaerobically. Performed at [facility], [place] [address], [place], [place] [address]    Report Status 11/18/2020 FINAL  Final  Culture, respiratory     Status: None   Collection Time: 11/17/20 11:48 AM   Specimen: Bronchoalveolar Lavage; Respiratory  Result Value Ref Range Status   Specimen Description   Final    BRONCHIAL ALVEOLAR LAVAGE Performed at [facility] [address], [place], [place] [address]    Special Requests   Final    NONE Performed at [facility], [place] [address], [place], [place] [address]    Gram Stain   Final    RARE WBC PRESENT, PREDOMINANTLY PMN NO ORGANISMS SEEN    Culture   Final    NO GROWTH 2 DAYS Performed at [facility] [address] [place], [place] [address]    Report Status 11/19/2020 FINAL  Final  Acid Fast Smear (AFB)     Status: None   Collection Time: 11/17/20 11:48 AM   Specimen: Bronchial Alveolar Lavage  Result Value Ref Range Status    AFB Specimen Processing Concentration  Final   Acid Fast Smear Negative  Final    Comment: (NOTE) Performed At: [facility] [place], [place] [id number] Rush Farmer MD [id number]    Source (AFB) BRONCHIAL ALVEOLAR LAVAGE  Final    Comment: Performed at [facility], [place] [address], [place], [place] [address]  Fungus Culture With Stain     Status: None (Preliminary result)   Collection Time: 11/17/20 11:48 AM   Specimen: Bronchial Alveolar Lavage  Result Value Ref Range Status   Fungus Stain Final report  Final    Comment: (NOTE) Performed At: [facility] [place], [place] [id number] Rush Farmer MD [id number]    Fungus (Mycology) Culture PENDING  Incomplete   Fungal Source BRONCHIAL ALVEOLAR LAVAGE  Final    Comment: Performed at [facility], [place] [address] [place], [place] [address]  Fungus Culture Result     Status: None   Collection Time: 11/17/20 11:48 AM  Result Value Ref Range Status   Result 1 Comment  Final    Comment: (NOTE) KOH/Calcofluor preparation:  no fungus observed. Performed At: [facility] [place], [place] [id number] Rush Farmer MD [id number]      Radiology Studies: No results found. DG CHEST PORT 1 VIEW  Final Result    DG C-ARM BRONCHOSCOPY  Final Result    VAS [place] UPPER EXTREMITY VENOUS DUPLEX  Final Result  CT ANGIO CHEST PE W OR WO CONTRAST  Final Result    IR US Guide Bx Asp/Drain  Final Result    MR THORACIC SPINE W WO CONTRAST  Final Result    CT Angio Chest PE W/Cm &/Or Wo Cm  Final Result    DG Chest Port 1 View  Final Result    DG Chest Portable 2 Views    (Results Pending)    Scheduled Meds: . atorvastatin  10 mg Oral Daily  . busPIRone  7.5 mg Oral BID  . Chlorhexidine Gluconate Cloth  6 each Topical Daily  . dextromethorphan-guaiFENesin  1 tablet Oral BID  . famotidine  20 mg Oral Daily  .  Ferrous Fumarate  1 tablet Oral Daily  . insulin aspart  0-20 Units Subcutaneous Q4H  . ipratropium-albuterol  3 mL Nebulization TID  . irbesartan  37.5 mg Oral Daily  . pantoprazole  40 mg Oral BID AC  . predniSONE  60 mg Oral Q breakfast  . pregabalin  75 mg Oral BID  . Rivaroxaban  15 mg Oral BID WC   Followed by  . [START ON 12/10/2020] rivaroxaban  20 mg Oral Q supper  . sucralfate  1 g Oral TID WC & HS  . sulfamethoxazole-trimethoprim  1 tablet Oral Q12H  . tamsulosin  0.8 mg Oral Daily  . venlafaxine XR  150 mg Oral Q breakfast   PRN Meds: acetaminophen, ALPRAZolam, hydrALAZINE, HYDROcodone-acetaminophen, ipratropium-albuterol, labetalol, morphine injection, sodium chloride, sodium chloride flush, tiZANidine, zolpidem Continuous Infusions: . lactated ringers 10 mL/hr at 11/17/20 1106     LOS: 10 days  Time spent: [place], DO [facility] 11/21/2020, 7:27 AM

## 2020-11-22 DIAGNOSIS — E119 Type 2 diabetes mellitus without complications: Secondary | ICD-10-CM | POA: Diagnosis not present

## 2020-11-22 DIAGNOSIS — N400 Enlarged prostate without lower urinary tract symptoms: Secondary | ICD-10-CM | POA: Diagnosis not present

## 2020-11-22 DIAGNOSIS — J9601 Acute respiratory failure with hypoxia: Secondary | ICD-10-CM | POA: Diagnosis not present

## 2020-11-22 DIAGNOSIS — J189 Pneumonia, unspecified organism: Secondary | ICD-10-CM | POA: Diagnosis not present

## 2020-11-22 LAB — CBC
HCT: 33.7 % — ABNORMAL LOW (ref 39.0–52.0)
Hemoglobin: 10.9 g/dL — ABNORMAL LOW (ref 13.0–17.0)
MCH: 29.9 pg (ref 26.0–34.0)
MCHC: 32.3 g/dL (ref 30.0–36.0)
MCV: 92.3 fL (ref 80.0–100.0)
Platelets: 388 10*3/uL (ref 150–400)
RBC: 3.65 MIL/uL — ABNORMAL LOW (ref 4.22–5.81)
RDW: 14.6 % (ref 11.5–15.5)
WBC: 11.3 10*3/uL — ABNORMAL HIGH (ref 4.0–10.5)
nRBC: 0 % (ref 0.0–0.2)

## 2020-11-22 LAB — BASIC METABOLIC PANEL
Anion gap: 8 (ref 5–15)
BUN: 20 mg/dL (ref 8–23)
CO2: 25 mmol/L (ref 22–32)
Calcium: 8 mg/dL — ABNORMAL LOW (ref 8.9–10.3)
Chloride: 100 mmol/L (ref 98–111)
Creatinine, Ser: 0.84 mg/dL (ref 0.61–1.24)
GFR, Estimated: >60 mL/min (ref >60)
Glucose, Bld: 177 mg/dL — ABNORMAL HIGH (ref 70–99)
Potassium: 3.7 mmol/L (ref 3.5–5.1)
Sodium: 133 mmol/L — ABNORMAL LOW (ref 135–145)

## 2020-11-22 LAB — GLUCOSE, CAPILLARY
Glucose-Capillary: 143 mg/dL — ABNORMAL HIGH (ref 70–99)
Glucose-Capillary: 166 mg/dL — ABNORMAL HIGH (ref 70–99)
Glucose-Capillary: 247 mg/dL — ABNORMAL HIGH (ref 70–99)
Glucose-Capillary: 267 mg/dL — ABNORMAL HIGH (ref 70–99)
Glucose-Capillary: 294 mg/dL — ABNORMAL HIGH (ref 70–99)
Glucose-Capillary: 363 mg/dL — ABNORMAL HIGH (ref 70–99)

## 2020-11-22 NOTE — Progress Notes (Signed)
Progress Note  Luis Brown   ZHG:[id number]  DOB: 07/22/1945  DOA: 11/10/2020     11  PCP: Elise Benne  CC: SOB  Hospital Course: Luis Brown is a 75 yo male with PMH vertebral OM (was on 6 wk Dapto course PTA), peripheral neuropathy, HLD, HTN, DMII, DDD, BPH, anxiety/depression who presented to the hospital with SOB.  He initially presented to his PCPs office and was found to be hypoxic.  He was referred to the ER.  He underwent work-up with CTA chest which showed a large right sided PNA with ground glass nodules and concern for central cavitation.  He was started on IV antibiotics on admission.  Infectious disease was consulted given prolonged antibiotic course at home and now with atypical pneumonia.  Blood cultures were obtained on admission.  Repeat MRI T-spine and echo were also ordered. The echo was negative for valvular vegetation.  MRI was negative for deep infection but did reveal a subcutaneous probable seroma.  IR was consulted per ID for aspiration/drainage and culturing of fluid.  He underwent aspiration on 11/13/2020.  Fluid studies were sent for culture and did not grow (appearance was also non-infectious).  He had some swelling and irritation associated with his PICC line.  He underwent right upper extremity duplex which was positive for DVT.  He was started on heparin drip on 11/16/2020 and PICC line was removed. Due to worsening respiratory symptoms, increasing oxygen requirements, he underwent repeat CTA chest.  This showed worsening right-sided pneumonia.  Pulmonology was consulted and he also underwent bronchoscopy on 11/17/2020.   Review of systems/interval History:  No acute issues or events overnight denies nausea vomiting diarrhea constipation headache fevers or chills.  Still somewhat anxious about discharge status but we discussed focusing on what he can change which is activity levels and working with incentive spirometry and flutter.  Assessment &  Plan:  CAP (community acquired pneumonia), resolving - CAP with higher risk MDRO due to prolonged IV abx (dapto) and recent exposure to healthcare - Completed 10 days of abx w/  - CTA read on 4/26 suggests right sided cavitary lesions - Follow up blood cultures remain negative - MRI T-spine shows probable seroma; IR consulted per ID - aspiration/drainage on 11/13/20 (3cc drained, non infected appearing)  - Repeat CTA on 5/1 shows worsening right sided PNA, small effusion, no PE - Pulmonology consulted 5/2. Patient underwent bronch 5/3. Follow up bronch studies fairly unremarkable  DVT (deep venous thrombosis) of the upper extremity ([place]), likely provoked by PICC line - RUE pain on 11/16/2020 - US shows acute DVT noted in right axillary and brachial veins - PICC removed  - Heparin transition to xarelto on 11/19/20  Acute respiratory failure with hypoxia ([place]), resolving - New as of 4/30.  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 Seen by ID on 09/28/20 (prior to this had also been on a prolonged abx course for prior infection).  Plan was: 6 weeks Dapto followed by Bactrim up to 3 months if tolerates - s ID following; continue on Vanc/cefepime for now while in hospital  - repeat MRI T-spine 11/11/20: no epidural abscess. New fluid collection in SQ tissue T3-4 to T6-7 measuring 6 cm, considered  a seroma (to be aspirated for culture on 4/29) -Will transition vancomycin to Bactrim once pneumonia further treated  BPH (benign prostatic hyperplasia) - Continue Flomax  Hyperlipidemia - Continue Lipitor  Diabetes mellitus without complication (HCC) - Continue SSI and CBG monitoring  - Last A1c 6.3% on 09/08/20  HTN (hypertension) - Continue Avapro  Hyponatremia-resolved as of 11/11/2020 - Na 130 on admission; suspected hypovolemia - improved  Antimicrobials: Azithromycin 11/11/20 x 1 Vancomycin 11/10/2020 >> 11/19/20 Cefepime 11/10/2020 >> 11/19/20  Flagyl 11/11/2020 >> 11/17/20 Bactrim 11/18/20 - ongoing  DVT prophylaxis:  Code Status:   Code Status: Full Code Family Communication: wife at bedside  Disposition Plan: Status is: Inpatient  Remains inpatient appropriate because:hypoxia to a level unsafe for discharge  Dispo: The patient is from: Home              Anticipated d/c is to: Home 48-72h              Patient currently is not medically stable to d/c.   Difficult to place patient No  Risk of unplanned readmission score: Unplanned Admission- Pilot do not use: 22.86   Objective: Blood pressure 133/65, pulse (!) 54, temperature 97.7 F (36.5 C), temperature source Oral, resp. rate 19, height _0  (1.778 m), weight 75.9 kg, SpO2 95 %.   Examination: General appearance: Pleasant but anxious adult man resting in bed appearing comfortable and in no distress.   Head: Normocephalic, without obvious abnormality, atraumatic Eyes: EOMI Lungs: Scant end expiratory wheeze R apex, otherwise CTA  Heart: regular rate and rhythm and S1, S2 normal Abdomen: normal findings: bowel sounds normal and soft, non-tender Extremities: extremities normal, atraumatic, no cyanosis or edema Skin: mobility and turgor normal Neurologic: Grossly normal  Consultants:   ID  IR  Pulmonology  Procedures:   Thoracic fluid collection drainage with IR: 11/13/2020  Data Reviewed: I have  personally reviewed following labs and imaging studies Results for orders placed or performed during the hospital encounter of 11/10/20 (from the past 24 hour(s))  Glucose, capillary     Status: Abnormal   Collection Time: 11/21/20  7:39 AM  Result Value Ref Range   Glucose-Capillary 199 (H) 70 - 99 mg/dL  Glucose, capillary     Status: Abnormal   Collection Time: 11/21/20 11:45 AM  Result Value Ref Range   Glucose-Capillary 312 (H) 70 - 99 mg/dL  Glucose, capillary     Status: Abnormal   Collection Time: 11/21/20  5:16 PM  Result Value Ref Range   Glucose-Capillary 280 (H) 70 - 99 mg/dL  Glucose, capillary     Status: Abnormal   Collection Time: 11/21/20  7:53 PM  Result Value Ref Range   Glucose-Capillary 220 (H) 70 - 99 mg/dL  Glucose, capillary     Status: Abnormal   Collection Time: 11/21/20 11:29 PM  Result Value Ref Range   Glucose-Capillary 188 (H) 70 - 99 mg/dL  Glucose, capillary     Status: Abnormal   Collection Time: 11/22/20  3:58 AM  Result Value Ref Range   Glucose-Capillary 166 (H) 70 - 99 mg/dL  CBC     Status: Abnormal   Collection Time: 11/22/20  4:56 AM  Result Value  Ref Range   WBC 11.3 (H) 4.0 - 10.5 K/uL   RBC 3.65 (L) 4.22 - 5.81 MIL/uL   Hemoglobin 10.9 (L) 13.0 - 17.0 g/dL   HCT 33.7 (L) 39.0 - 52.0 %   MCV 92.3 80.0 - 100.0 fL   MCH 29.9 26.0 - 34.0 pg   MCHC 32.3 30.0 - 36.0 g/dL   RDW 14.6 11.5 - 15.5 %   Platelets 388 150 - 400 K/uL   nRBC 0.0 0.0 - 0.2 %  Basic metabolic panel     Status: Abnormal   Collection Time: 11/22/20  4:56 AM  Result Value Ref Range   Sodium 133 (L) 135 - 145 mmol/L   Potassium 3.7 3.5 - 5.1 mmol/L   Chloride 100 98 - 111 mmol/L   CO2 25 22 - 32 mmol/L   Glucose, Bld 177 (H) 70 - 99 mg/dL   BUN 20 8 - 23 mg/dL   Creatinine, Ser 0.84 0.61 - 1.24 mg/dL   Calcium 8.0 (L) 8.9 - 10.3 mg/dL   GFR, Estimated >60 >60 mL/min   Anion gap 8 5 - 15   *Note: Due to a large number of results and/or encounters for the  requested time period, some results have not been displayed. A complete set of results can be found in Results Review.    Recent Results (from the past 240 hour(s))  Respiratory (~20 pathogens) panel by PCR     Status: None   Collection Time: 11/12/20  1:51 PM  Result Value Ref Range Status   Adenovirus NOT DETECTED NOT DETECTED Final   Coronavirus 229E NOT DETECTED NOT DETECTED Final    Comment: (NOTE) The Coronavirus on the Respiratory Panel, DOES NOT test for the novel  Coronavirus (2019 nCoV)    Coronavirus HKU1 NOT DETECTED NOT DETECTED Final   Coronavirus NL63 NOT DETECTED NOT DETECTED Final   Coronavirus OC43 NOT DETECTED NOT DETECTED Final   Metapneumovirus NOT DETECTED NOT DETECTED Final   Rhinovirus / Enterovirus NOT DETECTED NOT DETECTED Final   Influenza A NOT DETECTED NOT DETECTED Final   Influenza B NOT DETECTED NOT DETECTED Final   Parainfluenza Virus 1 NOT DETECTED NOT DETECTED Final   Parainfluenza Virus 2 NOT DETECTED NOT DETECTED Final   Parainfluenza Virus 3 NOT DETECTED NOT DETECTED Final   Parainfluenza Virus 4 NOT DETECTED NOT DETECTED Final   Respiratory Syncytial Virus NOT DETECTED NOT DETECTED Final   Bordetella pertussis NOT DETECTED NOT DETECTED Final   Bordetella Parapertussis NOT DETECTED NOT DETECTED Final   Chlamydophila pneumoniae NOT DETECTED NOT DETECTED Final   Mycoplasma pneumoniae NOT DETECTED NOT DETECTED Final    Comment: Performed at [facility], [place] [address], [place], [place] [address]  Aerobic/Anaerobic Culture (surgical/deep wound)     Status: None   Collection Time: 11/13/20  8:47 AM   Specimen: Thoracic; Wound  Result Value Ref Range Status   Specimen Description   Final    THORACIC T5 T6 Performed at [place] [address], [place], [place] [address]    Special Requests   Final    Normal Performed at [facility], [place] [address] [place], [place] [address]    Gram Stain   Final     FEW WBC PRESENT,BOTH PMN AND MONONUCLEAR NO ORGANISMS SEEN    Culture   Final    No growth aerobically or anaerobically. Performed at [facility], [place] [address], [place], [place] [address]  Report Status 11/18/2020 FINAL  Final  Culture, respiratory     Status: None   Collection Time: 11/17/20 11:48 AM   Specimen: Bronchoalveolar Lavage; Respiratory  Result Value Ref Range Status   Specimen Description   Final    BRONCHIAL ALVEOLAR LAVAGE Performed at [place] [address], [place], [place] [address]    Special Requests   Final    NONE Performed at [facility], [place] [address], [place], [place] [address]    Gram Stain   Final    RARE WBC PRESENT, PREDOMINANTLY PMN NO ORGANISMS SEEN    Culture   Final    NO GROWTH 2 DAYS Performed at [place] [address], [place], [place] [address]    Report Status 11/19/2020 FINAL  Final  Acid Fast Smear (AFB)     Status: None   Collection Time: 11/17/20 11:48 AM   Specimen: Bronchial Alveolar Lavage  Result Value Ref Range Status   AFB Specimen Processing Concentration  Final   Acid Fast Smear Negative  Final    Comment: (NOTE) Performed At: [facility] [place], [place] [id number] Rush Farmer MD VE:[id number]    Source (AFB) BRONCHIAL ALVEOLAR LAVAGE  Final    Comment: Performed at [facility], [place] [address], [place], [place] [address]  Fungus Culture With Stain     Status: None (Preliminary result)   Collection Time: 11/17/20 11:48 AM   Specimen: Bronchial Alveolar Lavage  Result Value Ref Range Status   Fungus Stain Final report  Final    Comment: (NOTE) Performed At: [facility] [place], [place] [id number] Rush Farmer MD IR:[id number]    Fungus (Mycology) Culture PENDING  Incomplete   Fungal Source BRONCHIAL ALVEOLAR LAVAGE  Final    Comment: Performed at [facility], [place] [address] [place], [place] [address]  Fungus Culture Result     Status: None   Collection Time: 11/17/20 11:48 AM  Result Value Ref Range Status   Result 1 Comment  Final    Comment: (NOTE) KOH/Calcofluor preparation:  no fungus observed. Performed At: [facility] [place], [place] [id number] Rush Farmer MD PY:[id number]      Radiology Studies: No results found. DG CHEST PORT 1 VIEW  Final Result    DG C-ARM BRONCHOSCOPY  Final Result    VAS [place] UPPER EXTREMITY VENOUS DUPLEX  Final Result    CT ANGIO CHEST PE W OR WO CONTRAST  Final Result    IR US Guide Bx Asp/Drain  Final Result    MR THORACIC SPINE W WO CONTRAST  Final Result    CT Angio Chest PE W/Cm &/Or Wo Cm  Final Result    DG Chest Port 1 View  Final Result    DG Chest Portable 2 Views    (Results Pending)    Scheduled Meds: . atorvastatin  10 mg Oral Daily  . busPIRone  7.5 mg Oral BID  . Chlorhexidine Gluconate Cloth  6 each Topical Daily  . dextromethorphan-guaiFENesin  1 tablet Oral BID  . famotidine  20 mg Oral Daily  . Ferrous Fumarate  1 tablet Oral Daily  . insulin aspart  0-20 Units Subcutaneous Q4H  . ipratropium-albuterol  3 mL Nebulization TID  . irbesartan  37.5 mg Oral Daily  . pantoprazole  40 mg Oral BID AC  . predniSONE  60 mg Oral Q breakfast  . pregabalin  75 mg Oral BID  . Rivaroxaban  15 mg Oral BID WC   Followed by  . [START ON 12/10/2020] rivaroxaban  20 mg Oral Q supper  . sucralfate  1 g Oral TID WC & HS  . sulfamethoxazole-trimethoprim  1 tablet Oral Q12H  . tamsulosin  0.8 mg Oral Daily  . venlafaxine XR  150 mg Oral Q breakfast   PRN Meds: acetaminophen, ALPRAZolam, hydrALAZINE, HYDROcodone-acetaminophen, ipratropium-albuterol, labetalol, morphine injection, sodium chloride, sodium chloride flush, tiZANidine, zolpidem Continuous Infusions: . lactated ringers 10 mL/hr at 11/17/20 1106     LOS: 11 days  Time spent: 35  min  Little Ishikawa, DO [facility] 11/22/2020, 7:32 AM

## 2020-11-22 NOTE — Progress Notes (Signed)
SATURATION QUALIFICATIONS: (This note is used to comply with regulatory documentation for home oxygen)  Patient Saturations on Room Air at Rest = 86%  Patient Saturations on Room Air while Ambulating = 84%  Patient Saturations on 2 Liters of oxygen while Ambulating = 92%  Please briefly explain why patient needs home oxygen: Pt's oxygen saturation decreases when pt is on room air. Requires increase in oxygen requirement when ambulatory.  Kendle Turbin, Laurel Dimmer, RN

## 2020-11-22 NOTE — Progress Notes (Signed)
Patient ambulated 360 feet in hallway, starting on 2 liters of oxygen via [place]. During ambulation pt required increase of oxygen to 6 Liters to maintain an oxygen saturation level of 88%. Upon returning to room and sitting in bed, oxygen was decreased to 4 liters via [place], and saturation at 92%.  Wynter Isaacs, Laurel Dimmer, RN

## 2020-11-23 ENCOUNTER — Ambulatory Visit: Payer: Medicare Other | Admitting: Internal Medicine

## 2020-11-23 ENCOUNTER — Encounter: Payer: Self-pay | Admitting: Medical

## 2020-11-23 ENCOUNTER — Inpatient Hospital Stay (HOSPITAL_COMMUNITY): Payer: Medicare Other

## 2020-11-23 DIAGNOSIS — J189 Pneumonia, unspecified organism: Secondary | ICD-10-CM | POA: Diagnosis not present

## 2020-11-23 DIAGNOSIS — T50905A Adverse effect of unspecified drugs, medicaments and biological substances, initial encounter: Secondary | ICD-10-CM | POA: Diagnosis not present

## 2020-11-23 LAB — GLUCOSE, CAPILLARY
Glucose-Capillary: 70 mg/dL (ref 70–99)
Glucose-Capillary: 144 mg/dL — ABNORMAL HIGH (ref 70–99)
Glucose-Capillary: 194 mg/dL — ABNORMAL HIGH (ref 70–99)

## 2020-11-23 MED ORDER — ALPRAZOLAM 0.5 MG PO TABS: 0.5000 mg | ORAL_TABLET | Freq: Two times a day (BID) | ORAL | 0 refills | Status: DC | PRN

## 2020-11-23 MED ORDER — PREDNISONE 20 MG PO TABS: 60.0000 mg | ORAL_TABLET | Freq: Every day | ORAL | 0 refills | Status: DC

## 2020-11-23 MED ORDER — RIVAROXABAN 20 MG PO TABS: 20.0000 mg | ORAL_TABLET | Freq: Every day | ORAL | 0 refills | Status: DC

## 2020-11-23 MED ORDER — DM-GUAIFENESIN ER 30-600 MG PO TB12: 1.0000 | ORAL_TABLET | Freq: Two times a day (BID) | ORAL | 0 refills | Status: DC

## 2020-11-23 MED ORDER — SULFAMETHOXAZOLE-TRIMETHOPRIM 800-160 MG PO TABS: 1.0000 | ORAL_TABLET | Freq: Two times a day (BID) | ORAL | 0 refills | Status: DC

## 2020-11-23 MED ORDER — RIVAROXABAN 15 MG PO TABS: 15.0000 mg | ORAL_TABLET | Freq: Two times a day (BID) | ORAL | 0 refills | Status: DC

## 2020-11-23 MED ORDER — SALINE SPRAY 0.65 % NA SOLN: 1.0000 | NASAL | 0 refills | Status: DC | PRN

## 2020-11-23 NOTE — TOC Transition Note (Signed)
Transition of Care [facility]) - CM/SW Discharge Note   Patient Details  Name: Luis Brown MRN: [id number] Date of Birth: 11-Mar-1946  [facility]) CM/SW Contact:  Dessa Phi, RN Phone Number: 11/23/2020, 1:31 PM   Clinical Narrative:  D/c home today [facility] rep Ronalee Belts aware;[facility] delivered home 02 to rm prior d/c. No further CM needs.     Final next level of care: [place] Barriers to Discharge: No Barriers Identified   Patient Goals and CMS Choice Patient states their goals for this hospitalization and ongoing recovery are:: go home CMS Medicare.gov Compare Post Acute Care list provided to:: Patient Represenative (must comment) [name] spouse [phone]) Choice offered to / list presented to : Spouse  Discharge Placement                       Discharge Plan and Services   Discharge Planning Services: CM Consult Post Acute Care Choice:  (otpt PT)          DME Arranged: Oxygen DME Agency: [facility] Date DME Agency Contacted: 11/23/20 Time DME Agency Contacted: 0630 Representative spoke with at DME Agency: [place]: PT [place]: [facility] [employer]) Date [place]: 11/20/20 Time [place]: 1049 Representative spoke with at [place]: [place] ([place]) Interventions     Readmission Risk Interventions Readmission Risk Prevention Plan 11/19/2020  Transportation Screening Complete  PCP or Specialist Appt within 5-7 Days Complete  Home Care Screening Complete  Medication Review (RN CM) Complete  Some recent data might be hidden

## 2020-11-23 NOTE — Progress Notes (Signed)
Appointment made for 12/01/20 with Luis Brown in pulmonary office.  He will then follow up with Dr. Chase Caller.  He should remain on prednisone 60 mg daily until his appointment on 12/01/20, and assess when to taper dose.    D/w Dr. Avon Gully.  Chesley Mires, MD [place] Pager - [phone] 11/23/2020, 1:07 PM

## 2020-11-23 NOTE — Telephone Encounter (Signed)
I called and spoke with patient regarding f/u appts after hospital visit. Patient has an appt with Benjamine Mola on 11/28/20 and with Dr. Chase Caller on 0614/2022. Patient stated he called back and spoke with someone who made the appts but do not see a note. Patient verbalized understanding to call if anything else was needed. Nothing further needed at this time.

## 2020-11-23 NOTE — Progress Notes (Signed)
[place] Saturations on room air at rest =90% [employer] on [employer] while ambulating is 81% [employer] on 2 L of oxygen while ambulating is 94%.

## 2020-11-23 NOTE — Care Management Important Message (Signed)
Medicare IM printed remotely for Social Work team to give to the patient. 

## 2020-11-23 NOTE — Progress Notes (Addendum)
Physical Therapy Treatment Patient Details Name: Luis Brown MRN: [id number] DOB: 10/24/1945 Today's Date: 11/23/2020    SATURATION QUALIFICATIONS: (This note is used to comply with regulatory documentation for home oxygen)  Patient Saturations on Room Air at Rest = 92%  Patient Saturations on Room Air while Ambulating = 80%  Patient Saturations on 3 Liters of oxygen while Ambulating = 90%   History of Present Illness Pt is 75 yo male who presented to ED with hypoxia on 11/10/20 and admitted with atypical pnemonia.  Additionally, pt had MRI T spine and found to have subcutaneous seroma and is s/p aspiration on 11/13/20.  He has PMH including recent vertebral osteomyelitis requiring removal of hardware on 09/14/20 still on antibiotics, peripheral neuropathy, HLC, HTN, DM, DDD, BPH, anxiety and depression.    PT Comments    Progressing well with mobility. O2 reading fluctuated between 90-94% on 3L. Possible d/c home today per pt.    Follow Up Recommendations  Home health PT;Supervision for mobility/OOB     Equipment Recommendations   (has access to rollator)    Recommendations for Other Services       Precautions / Restrictions Precautions Precautions: Fall Precaution Comments: monitor O2 Restrictions Weight Bearing Restrictions: No    Mobility  Bed Mobility               General bed mobility comments: oob in recliner    Transfers Overall transfer level: Needs assistance Equipment used: 4-wheeled walker Transfers: Sit to/from Stand Sit to Stand: Supervision         General transfer comment: for safety. good operation of rollator  Ambulation/Gait Ambulation/Gait assistance: Min guard Gait Distance (Feet): 200 Feet Assistive device: 4-wheeled walker Gait Pattern/deviations: Step-through pattern;Decreased stride length     General Gait Details: 1 seated rest break 2* HR reading of 140 (unsure if this was artifact-returned to 80-90s after rest and  remained WNL thereafter). Good operation of rollator. Does well with pursed lip breathing   Stairs             Wheelchair Mobility    Modified Rankin (Stroke Patients Only)       Balance Overall balance assessment: Needs assistance         Standing balance support: Bilateral upper extremity supported Standing balance-Leahy Scale: Fair                              Cognition Arousal/Alertness: Awake/alert Behavior During Therapy: WFL for tasks assessed/performed Overall Cognitive Status: Within Functional Limits for tasks assessed                                        Exercises      General Comments        Pertinent Vitals/Pain Pain Assessment: No/denies pain    Home Living                      Prior Function            PT Goals (current goals can now be found in the care plan section) Progress towards PT goals: Progressing toward goals    Frequency    Min 3X/week      PT Plan Current plan remains appropriate    Co-evaluation              AM-PAC  PT "6 Clicks" Mobility   Outcome Measure  Help needed turning from your back to your side while in a flat bed without using bedrails?: None Help needed moving from lying on your back to sitting on the side of a flat bed without using bedrails?: None Help needed moving to and from a bed to a chair (including a wheelchair)?: A Little Help needed standing up from a chair using your arms (e.g., wheelchair or bedside chair)?: A Little Help needed to walk in hospital room?: A Little Help needed climbing 3-5 steps with a railing? : A Little 6 Click Score: 20    End of Session Equipment Utilized During Treatment: Oxygen Activity Tolerance: Patient tolerated treatment well Patient left: in chair;with call bell/phone within reach   PT Visit Diagnosis: Difficulty in walking, not elsewhere classified (R26.2);Unsteadiness on feet (R26.81)     Time: 1000-1016 PT  Time Calculation (min) (ACUTE ONLY): 16 min  Charges:  $Gait Training: 8-22 mins                        Doreatha Massed, PT Acute Rehabilitation  Office: [phone] Pager: [phone]

## 2020-11-23 NOTE — Telephone Encounter (Signed)
ATC LVMTCB if pt does return call please schedule pt for APP hospital f/u in 7 - 10 days and OV 30 min appt in June/July with Dr. Chase Caller. Will attempt to reach pt at later time.

## 2020-11-23 NOTE — Plan of Care (Signed)
  Problem: Health Behavior/Discharge Planning: Goal: Ability to manage health-related needs will improve Outcome: Progressing   Problem: Clinical Measurements: Goal: Ability to maintain clinical measurements within normal limits will improve Outcome: Progressing Goal: Will remain free from infection Outcome: Progressing Goal: Diagnostic test results will improve Outcome: Progressing Goal: Cardiovascular complication will be avoided Outcome: Progressing   Problem: Nutrition: Goal: Adequate nutrition will be maintained Outcome: Progressing   Problem: Coping: Goal: Level of anxiety will decrease Outcome: Progressing   

## 2020-11-23 NOTE — Discharge Summary (Signed)
Physician Discharge Summary  RASHON WESTRUP AYT:[id number] DOB: 1946-03-19 DOA: 11/10/2020  PCP: Mackie Pai, PA-C  Admit date: 11/10/2020 Discharge date: 11/23/2020  Admitted From: Home Disposition: Home with home health  Recommendations for Outpatient Follow-up:  1. Follow up with PCP in 1-2 weeks 2. Please obtain BMP/CBC in one week 3. Please follow up on the following pending results: 4. Appointment made for 12/01/20 with [name] in pulmonary office.  He will then follow up with Dr. Chase Caller.  He should remain on prednisone 60 mg daily until his appointment on 12/01/20, and assess when to taper dose.     Home Health: PT Equipment/Devices: Rolling walker  Discharge Condition: Stable CODE STATUS: Full Diet recommendation: As tolerated  Brief/Interim Summary: Mr. 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 He initially presented to his PCPs office and was found to be hypoxic.  He was referred to the ER.  He underwent work-up with CTA chest which showed a large right sided PNA with ground glass nodules and concern for central cavitation.  He was started on IV antibiotics on admission.  Infectious disease was consulted given prolonged antibiotic course at home and now with atypical pneumonia.  Blood cultures were obtained on admission.  Repeat MRI T-spine and echo were also ordered. The echo was negative for valvular vegetation.  MRI was negative for deep infection but did reveal a subcutaneous probable seroma.  IR was consulted per ID for aspiration/drainage and culturing of fluid.  He underwent aspiration on 11/13/2020.  Fluid studies were sent for culture and did not grow (appearance was also non-infectious). He had some swelling and irritation associated with his PICC line.  He underwent right upper extremity duplex which was positive for DVT.   He was started on heparin drip on 11/16/2020 and PICC line was removed. Due to worsening respiratory symptoms, increasing oxygen requirements, he underwent repeat CTA chest.  This showed worsening right-sided pneumonia.  Pulmonology was consulted and he also underwent bronchoscopy on 11/17/2020.  Bronchial washing was generally unremarkable, there was discussion on whether or not patient's worsening respiratory status was in the setting of concurrent pneumonia with possibly underlying daptomycin pneumonitis.  Daptomycin was discontinued, given cultures patient was de-escalated to Bactrim per ID recommendations for the interim.  He will follow-up in the outpatient setting with ID for ongoing antibiotic changes and administrations.  Patient's respiratory status has been his major issue over the past week, he has completed any antibiotic coverage for respiratory tract, again bronchial washings were unremarkable for eosinophils which would have made a more likely diagnosis of daptomycin related pneumonitis but these were not identified.  Regardless patient's respiratory status continues to improve quite drastically over the past 72 hours.  He does remain somewhat hypoxic, most notably with ambulation and as such she will be discharged home on oxygen.  He will need to have this oxygen weaned over the next few weeks.  He does have follow-up with pulmonology as above on the 17th, he will remain on steroids as well until that time for further evaluation.  Patient unfortunately had DVT forearm likely secondary to previously placed PICC line, he will be discharged on Xarelto, given its provoked nature he will likely be able to discontinue anticoagulation once imaging is negative in the next 3 to 6 months per protocol.  Patient otherwise stable and agreeable for discharge  home, close follow-up as outlined above with PCP, pulmonology, infectious disease.  Patient was discharged on new medications of Bactrim as well as Xanax  given his somewhat profound anxiety in relation to his dyspnea.  We discussed that Xanax is not and should not be a permanent fixture or addition to his home medication list and that he would need to follow-up with his PCP who currently prescribes his anxiolytics including Effexor.  Patient otherwise stable and agreeable for discharge.  Wife at bedside for the entirety of our discussion.   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[place])    Discharge Instructions  Discharge Instructions    Call MD for:  difficulty breathing, headache or visual disturbances   Complete by: As directed    Call MD for:  extreme fatigue   Complete by: As directed    Call MD for:  persistant dizziness or light-headedness   Complete by: As directed    Call MD for:  temperature >100.4   Complete by: As directed    Diet - low sodium heart healthy   Complete by: As directed    Increase activity slowly   Complete by: As directed    No wound care   Complete by: As directed      Allergies as of 11/23/2020   No Known Allergies     Medication List    STOP taking these medications   DAPTOmycin 500 MG injection Commonly known as: CUBICIN   sodium polystyrene 15 GM/60ML suspension Commonly known as: KAYEXALATE     TAKE these medications   albuterol 108 (90 Base) MCG/ACT inhaler Commonly known as: VENTOLIN HFA Inhale 2 puffs into the lungs every 6 (six) hours as needed.   ALPRAZolam 0.5 MG tablet Commonly known as: XANAX Take 1 tablet (0.5 mg total)  by mouth 2 (two) times daily as needed for up to 10 days for anxiety.   atorvastatin 10 MG tablet Commonly known as: LIPITOR Take 1 tablet (10 mg total) by mouth daily.   b complex vitamins capsule Take 1 capsule by mouth daily.   Bayer Aspirin EC Low Dose 81 MG EC tablet Generic drug: aspirin Take 81 mg by mouth every morning.   busPIRone 7.5 MG tablet Commonly known as: BUSPAR 1-2 TAB BY MOUTH 3 TIMES A DAY FOR ANXIETY What changed: See the new instructions.   cetirizine 10 MG tablet Commonly known as: ZYRTEC Take 10 mg by mouth daily. In the morning   Contour Next Test test strip Generic drug: glucose blood Check blood sugar once daily   dextromethorphan-guaiFENesin 30-600 MG 12hr tablet Commonly known as: MUCINEX DM Take 1 tablet by mouth 2 (two) times daily.   famotidine 40 MG tablet Commonly known as: PEPCID TAKE 1 TABLET BY MOUTH EVERYDAY AT BEDTIME What changed: See the new instructions.   Ferretts 325 (106 Fe) MG Tabs tablet Generic drug: ferrous fumarate Take 1 tablet (106 mg of iron total) by mouth daily.   HYDROcodone-acetaminophen 7.5-325 MG tablet Commonly known as: NORCO Take 1 tablet  by mouth every 4 (four) hours as needed for up to 7 days for moderate pain ((score 4 to 6)).   Januvia 50 MG tablet Generic drug: sitaGLIPtin TAKE 1 TABLET BY MOUTH EVERY DAY What changed: how much to take   metFORMIN 1000 MG tablet Commonly known as: GLUCOPHAGE Take 1 tablet (1,000 mg total) by mouth 2 (two) times daily with a meal.   olmesartan 5 MG tablet Commonly known as: BENICAR Take 1 tablet (5 mg total) by mouth daily.   pantoprazole 40 MG tablet Commonly known as: PROTONIX TAKE 1 TABLET BY MOUTH TWICE A DAY BEFORE A MEAL What changed: See the new instructions.   predniSONE 20 MG tablet Commonly known as: DELTASONE Take 3 tablets (60 mg total) by mouth daily with breakfast. Start taking on: Nov 24, 2020   pregabalin 75 MG capsule Commonly known  as: LYRICA TAKE 1 CAPSULE BY MOUTH TWICE A DAY   Rivaroxaban 15 MG Tabs tablet Commonly known as: XARELTO Take 1 tablet (15 mg total) by mouth 2 (two) times daily with a meal for 16 days.   rivaroxaban 20 MG Tabs tablet Commonly known as: XARELTO Take 1 tablet (20 mg total) by mouth daily with supper. Start taking on: Dec 10, 2020   sodium chloride 0.65 % Soln nasal spray Commonly known as: OCEAN Place 1 spray into both nostrils as needed for congestion.   sucralfate 1 g tablet Commonly known as: Carafate Take 1 tablet (1 g total) by mouth 4 (four) times daily -  with meals and at bedtime.   sulfamethoxazole-trimethoprim 800-160 MG tablet Commonly known as: BACTRIM DS Take 1 tablet by mouth every 12 (twelve) hours.   tamsulosin 0.4 MG Caps capsule Commonly known as: FLOMAX Take 2 capsules (0.8 mg total) by mouth daily.   testosterone cypionate 200 MG/ML injection Commonly known as: DEPOTESTOSTERONE CYPIONATE INJECT 1 ML (200 MG TOTAL) INTO THE MUSCLE EVERY 14 (FOURTEEN) DAYS.   tiZANidine 4 MG tablet Commonly known as: ZANAFLEX Take 1.5 tablets (6 mg total) by mouth every 6 (six) hours as needed for muscle spasms. What changed: when to take this   venlafaxine XR 150 MG 24 hr capsule Commonly known as: EFFEXOR-XR TAKE 1 CAPSULE (150 MG TOTAL) BY MOUTH DAILY WITH BREAKFAST.   zolpidem 5 MG tablet Commonly known as: AMBIEN Take 1 tablet (5 mg total) by mouth at bedtime as needed for sleep. What changed:   when to take this  reasons to take this            Durable Medical Equipment  (From admission, onward)         Start     Ordered   11/22/20 1800  For home use only DME oxygen  Once       Question Answer Comment  Length of Need 6 Months   Mode or (Route) Nasal cannula   Liters per Minute 6   Frequency Continuous (stationary and portable oxygen unit needed)   Oxygen delivery system Gas      11/22/20 1759          Follow-up Information    Tommy Medal,  Lavell Islam, MD. Go on 11/20/2020.   Specialty: Infectious Diseases Why: 9 am Contact information: [address] [place] [address] [phone]        Martyn Ehrich, NP Follow up on 12/01/2020.   Specialty: Pulmonary Disease Contact information: [place] [address] [phone]  [facility] [place] Follow up.   Specialty: [place] Why: [facility] physical therapy Contact information: [place] [address] [phone]        [facility] Follow up.   Why: oxygen Contact information: [address] [place] [address] [phone]              No Known Allergies  Consultations:  Pulmonology, infectious disease   Procedures/Studies: DG Chest 2 View  Result Date: 11/23/2020 CLINICAL DATA:  Pneumonia EXAM: CHEST - 2 VIEW COMPARISON:  11/17/2020 FINDINGS: Persistent right pulmonary opacities and right pleural effusion. Aeration is improved relative to prior study. Left lung is clear apart from some scarring at the lung base. No pneumothorax. Similar cardiomediastinal contours. IMPRESSION: Persistent right pulmonary opacities and right pleural effusion. Aeration improved compared to 11/17/2020. Electronically Signed   By: Macy Mis M.D.   On: 11/23/2020 09:28   CT ANGIO CHEST PE W OR WO CONTRAST  Result Date: 11/15/2020 CLINICAL DATA:  Chest pain and shortness of breath. EXAM: CT ANGIOGRAPHY CHEST WITH CONTRAST TECHNIQUE: Multidetector CT imaging of the chest was performed using the standard protocol during bolus administration of intravenous contrast. Multiplanar CT image reconstructions and MIPs were obtained to evaluate the vascular anatomy. CONTRAST:  161m OMNIPAQUE IOHEXOL 350 MG/ML SOLN COMPARISON:  11/10/2020 FINDINGS: Cardiovascular: Heart size upper normal to mildly increased. Coronary artery calcification is evident. Atherosclerotic calcification is noted in the  wall of the thoracic aorta. There is no filling defect within the opacified pulmonary arteries to suggest the presence of an acute pulmonary embolus. Mediastinum/Nodes: Mediastinal lymphadenopathy again noted. 13 mm precarinal lymph node evident. 13 mm short axis subcarinal lymph node. Mild right hilar lymphadenopathy. The esophagus has normal imaging features. There is no axillary lymphadenopathy. Lungs/Pleura: Interval progression of diffuse airspace disease in the right lung with ground-glass and consolidative components involving all 3 lobes. Small right pleural effusion associated. There is some minimal airspace disease in the anterior left upper lobe, similar to prior. Upper Abdomen: Unremarkable. Musculoskeletal: Extensive thoracolumbar fusion, incompletely visualized with evidence of prior hardware retrieval in the thoracic spine. Review of the MIP images confirms the above findings. IMPRESSION: 1. No CT evidence for acute pulmonary embolus. 2. Interval progression of diffuse airspace disease in the right lung with ground-glass and consolidative components involving all 3 lobes. Imaging features compatible with multifocal pneumonia. 3. Small right pleural effusion. 4. Mediastinal and right hilar lymphadenopathy, likely reactive. 5. Aortic Atherosclerosis (ICD10-I70.0). Electronically Signed   By: EMisty StanleyM.D.   On: 11/15/2020 09:17   CT Angio Chest PE W/Cm &/Or Wo Cm  Result Date: 11/10/2020 CLINICAL DATA:  75year old male with concern for pulmonary embolism. EXAM: CT ANGIOGRAPHY CHEST WITH CONTRAST TECHNIQUE: Multidetector CT imaging of the chest was performed using the standard protocol during bolus administration of intravenous contrast. Multiplanar CT image reconstructions and MIPs were obtained to evaluate the vascular anatomy. CONTRAST:  775mOMNIPAQUE IOHEXOL 350 MG/ML SOLN COMPARISON:  Chest radiograph dated 11/10/2020 and CT dated 12/25/2019. FINDINGS: Cardiovascular: There is no  cardiomegaly or pericardial effusion. There is coronary vascular calcification of the LAD. The thoracic aorta is unremarkable. The origins of the great vessels of the aortic arch appear patent as visualized. Evaluation of the pulmonary arteries is limited due to respiratory motion artifact and streak artifact caused by spinal metallic hardware. No pulmonary artery embolus identified. Mediastinum/Nodes: Right hilar adenopathy measures 15 mm in short axis. Mildly enlarged lymph node anterior  to the trachea measures 10 mm short axis. The esophagus and the thyroid gland are grossly unremarkable. No mediastinal fluid collection. Right-sided PICC with tip at the cavoatrial junction. Lungs/Pleura: There is background of mild centrilobular emphysema. Large patchy area of interstitial coarsening and diffuse ground-glass opacity primarily involving the lower lung zone and peripheral and subpleural lung new since the prior CT and most consistent with pneumonia, likely atypical in etiology. Clinical correlation and follow-up to resolution recommended. Several subpleural ground-glass nodules may have central cavitation and therefore septic emboli are not excluded. A 5 mm left lower lobe nodule appears similar to prior CT. There is a small right pleural effusion. No pneumothorax. The central airways are patent. Upper Abdomen: No acute abnormality. Musculoskeletal: Degenerative changes, osteopenia, and extensive spinal fusion. No acute osseous pathology. Review of the MIP images confirms the above findings. IMPRESSION: 1. No CT evidence of pulmonary embolism. 2. Findings most consistent with pneumonia, likely atypical in etiology. Clinical correlation and follow-up to resolution recommended. 3. Small right pleural effusion. 4. Aortic Atherosclerosis (ICD10-I70.0) and Emphysema (ICD10-J43.9). Electronically Signed   By: Anner Crete M.D.   On: 11/10/2020 18:32   MR THORACIC SPINE W WO CONTRAST  Result Date:  11/11/2020 CLINICAL DATA:  Epidural abscess. EXAM: MRI THORACIC WITHOUT AND WITH CONTRAST TECHNIQUE: Multiplanar and multiecho pulse sequences of the thoracic spine were obtained without and with intravenous contrast. CONTRAST:  28m GADAVIST GADOBUTROL 1 MMOL/ML IV SOLN COMPARISON:  MRI of the thoracic spine April 23, 2020 FINDINGS: Alignment:  Maintained. Vertebrae: Postsurgical changes from posterior fixation from T7 through the visualized upper lumbar spine with interval removal of T5 and T6 posterior hardware. Susceptibility artifact from the hardware limits evaluation, particularly in the lower thoracic region. Endplate destructive changes at T4-5 appear stable with improved marrow edema. Stable appearance of the T9-10 effusion. No new focus suspicious for discitis/osteomyelitis. No evidence of an epidural abscess. Cord: Evaluation limited by susceptibility artifact. No obvious cord lesion. Paraspinal and other soft tissues: New fluid collection in the subcutaneous from the T3-4 through the T6-7 level measuring up to 6 cm in length by 2.3 x 5.9 largest transverse and anteroposterior dimensions, likely related to seroma. Extensive right lung consolidation better described on recent chest CT. Disc levels: No high-grade spinal canal stenosis from T1-2 through T9-10. T10-11 and T11-12 levels are obscured by susceptibility artifact. No neural foraminal narrowing from T1-2 through T7-8. Evaluation of the neural foramen below T7-8 is limited by susceptibility artifact. IMPRESSION: 1. Postsurgical changes from posterior fixation from T7 through the visualized upper lumbar spine with interval removal of T5 and T6 posterior hardware. Susceptibility artifact from the hardware limits evaluation, particularly in the lower thoracic region. 2. Endplate destructive changes at T4-5 appear stable with improved marrow edema. Stable appearance of the T9-10 effusion. No evidence of an epidural abscess. 3. New fluid collection in  the subcutaneous from the T3-4 through the T6-7 level measuring up to 6 cm in length by 2.3 x 5.9 cm, likely a seroma. 4. Extensive right lung consolidation better described on recent chest CT. Electronically Signed   By: KPedro EarlsM.D.   On: 11/11/2020 13:47   IR [place] Bx Asp/Drain  Result Date: 11/13/2020 INDICATION: Thoracic spine paraspinous small fluid collection postop early EXAM: ULTRASOUND THORACIC PARASPINOUS FLUID COLLECTION ASPIRATION MEDICATIONS: 1% LIDOCAINE LOCAL ANESTHESIA/SEDATION: Moderate Sedation Time:  None. The patient was continuously monitored during the procedure by the interventional radiology nurse under my direct supervision. COMPLICATIONS: None immediate.  PROCEDURE: Informed written consent was obtained from the patient after a thorough discussion of the procedural risks, benefits and alternatives. All questions were addressed. Maximal Sterile Barrier Technique was utilized including caps, mask, sterile gowns, sterile gloves, sterile drape, hand hygiene and skin antiseptic. A timeout was performed prior to the initiation of the procedure. Previous imaging reviewed. Patient position prone. Preliminary ultrasound performed. The thin paraspinous free fluid about the spinous processes of the mid upper thoracic spine was localized and marked. Under sterile conditions and local anesthesia, an 18 gauge needle was advanced to the paraspinous thoracic fluid. Syringe aspiration yielded 3 cc thin amber serosanguineous fluid. Sample sent for culture. Needle removed. No immediate complication. Patient tolerated the procedure well. IMPRESSION: Successful ultrasound needle aspiration of the upper thoracic paraspinous free fluid Electronically Signed   By: Jerilynn Mages.  Shick M.D.   On: 11/13/2020 09:23   DG CHEST PORT 1 VIEW  Result Date: 11/17/2020 CLINICAL DATA:  Status post bronchoscopy EXAM: PORTABLE CHEST 1 VIEW COMPARISON:  November 10, 2020 chest radiograph; chest CT Nov 15, 2020  FINDINGS: Central catheter no longer present. Widespread airspace opacity persists throughout the right lung. Small right pleural effusion. Airspace consolidation noted throughout portions of the right mid and lower lung regions as well as right apex. Left lung is clear. Heart size is normal. Pulmonary vascularity within normal limits. No adenopathy is appreciable by radiography. Postoperative changes noted in much of the mid and lower thoracic and visualized upper lumbar spine regions. IMPRESSION: Extensive airspace opacity throughout the right lung with areas of consolidation in the right apex as well as in the right lower lung region. Small right pleural effusion. Left lung clear. Stable cardiac silhouette. No evident pneumothorax. Electronically Signed   By: Lowella Grip III M.D.   On: 11/17/2020 14:26   DG Chest Port 1 View  Result Date: 11/10/2020 CLINICAL DATA:  75 year old male with shortness of breath. EXAM: PORTABLE CHEST 1 VIEW COMPARISON:  Chest radiograph dated 01/07/2020. FINDINGS: Right-sided PICC with tip over the central SVC close to the cavoatrial junction. Right-sided pulmonary opacities predominantly involving the peripheral and subpleural lung, new since the prior radiograph, and most consistent with pneumonia and possibly viral or atypical in etiology. Clinical correlation and follow-up to resolution is recommended. The left lung is clear. No pleural effusion or pneumothorax. The cardiac silhouette is within limits. Spinal fusion hardware. No acute osseous pathology. IMPRESSION: Right-sided pulmonary opacities most consistent with pneumonia. Clinical correlation and follow-up to resolution is recommended. Electronically Signed   By: Anner Crete M.D.   On: 11/10/2020 17:25   ECHOCARDIOGRAM COMPLETE  Result Date: 11/11/2020    ECHOCARDIOGRAM REPORT   Patient Name:   CAYTON CUEVAS Date of Exam: 11/11/2020 Medical Rec #:  [id number]        Height:       70.0 in Accession #:     [id number]       Weight:       167.3 lb Date of Birth:  1945/10/03         BSA:          1.935 m Patient Age:    75 years         BP:           114/68 mmHg Patient Gender: M                HR:           102 bpm. Exam Location:  Inpatient Procedure:  2D Echo Indications:    Bacteremia  History:        Patient has no prior history of Echocardiogram examinations.                 Risk Factors:Hypertension, Dyslipidemia and Diabetes.  Sonographer:    Mikki Santee RDCS (AE) Referring Phys: [place]  1. Left ventricular ejection fraction, by estimation, is 60 to 65%. The left ventricle has normal function. The left ventricle has no regional wall motion abnormalities. Left ventricular diastolic parameters were normal.  2. Right ventricular systolic function is normal. The right ventricular size is mildly enlarged.  3. The mitral valve is normal in structure. No evidence of mitral valve regurgitation.  4. The aortic valve was not well visualized. Aortic valve regurgitation is not visualized. No aortic stenosis is present.  5. The inferior vena cava is normal in size with greater than 50% respiratory variability, suggesting right atrial pressure of 3 mmHg. Conclusion(s)/Recommendation(s): No vegetation seen. If high clinical suspicion for endocarditis consider TEE. FINDINGS  Left Ventricle: Left ventricular ejection fraction, by estimation, is 60 to 65%. The left ventricle has normal function. The left ventricle has no regional wall motion abnormalities. The left ventricular internal cavity size was normal in size. There is  no left ventricular hypertrophy. Left ventricular diastolic parameters were normal. Right Ventricle: The right ventricular size is mildly enlarged. No increase in right ventricular wall thickness. Right ventricular systolic function is normal. Left Atrium: Left atrial size was normal in size. Right Atrium: Right atrial size was normal in size. Pericardium: Trivial  pericardial effusion is present. Mitral Valve: The mitral valve is normal in structure. No evidence of mitral valve regurgitation. Tricuspid Valve: The tricuspid valve is normal in structure. Tricuspid valve regurgitation is not demonstrated. Aortic Valve: The aortic valve was not well visualized. Aortic valve regurgitation is not visualized. No aortic stenosis is present. Pulmonic Valve: The pulmonic valve was not well visualized. Pulmonic valve regurgitation is not visualized. Aorta: The aortic root is normal in size and structure. Venous: The inferior vena cava is normal in size with greater than 50% respiratory variability, suggesting right atrial pressure of 3 mmHg. IAS/Shunts: The interatrial septum was not well visualized.  LEFT VENTRICLE PLAX 2D LVIDd:         4.70 cm  Diastology LVIDs:         2.80 cm  LV e' medial:    7.83 cm/s LV PW:         0.80 cm  LV E/e' medial:  11.5 LV IVS:        0.80 cm  LV e' lateral:   8.49 cm/s LVOT diam:     2.10 cm  LV E/e' lateral: 10.6 LV SV:         85 LV SV Index:   44 LVOT Area:     3.46 cm  RIGHT VENTRICLE RV S prime:     11.80 cm/s TAPSE (M-mode): 1.1 cm LEFT ATRIUM           Index       RIGHT ATRIUM           Index LA diam:      3.20 cm 1.65 cm/m  RA Area:     13.30 cm LA Vol (A2C): 52.2 ml 26.98 ml/m RA Volume:   27.80 ml  14.37 ml/m LA Vol (A4C): 37.0 ml 19.12 ml/m  AORTIC VALVE LVOT Vmax:   147.00 cm/s LVOT Vmean:  92.500 cm/s LVOT VTI:    0.246 m  AORTA Ao Root diam: 3.20 cm MITRAL VALVE MV Area (PHT): 5.42 cm    SHUNTS MV Decel Time: 140 msec    Systemic VTI:  0.25 m MV E velocity: 89.70 cm/s  Systemic Diam: 2.10 cm MV A velocity: 88.60 cm/s MV E/A ratio:  1.01 Oswaldo Milian MD Electronically signed by Oswaldo Milian MD Signature Date/Time: 11/11/2020/6:43:20 PM    Final    VAS [place] UPPER EXTREMITY VENOUS DUPLEX  Result Date: 11/16/2020 UPPER VENOUS STUDY  Patient Name:  ARNEY MAYABB  Date of Exam:   11/16/2020 Medical Rec #: [id number]          Accession #:    [id number] Date of Birth: 03-Mar-1946          Patient Gender: M Patient Age:   75Y Exam Location:  [facility] Procedure:      VAS [place] UPPER EXTREMITY VENOUS DUPLEX Referring Phys: [place] --------------------------------------------------------------------------------  Indications: Swelling Risk Factors: Trauma PICC line. Limitations: Bandages and line. Comparison Study: No prior studies. Performing Technologist: Oliver Hum RVT  Examination Guidelines: A complete evaluation includes B-mode imaging, spectral Doppler, color Doppler, and power Doppler as needed of all accessible portions of each vessel. Bilateral testing is considered an integral part of a complete examination. Limited examinations for reoccurring indications may be performed as noted.  Right Findings: +----------+------------+---------+-----------+----------+-------+ RIGHT     CompressiblePhasicitySpontaneousPropertiesSummary +----------+------------+---------+-----------+----------+-------+ IJV           Full       Yes       Yes                      +----------+------------+---------+-----------+----------+-------+ Subclavian    Full       Yes       Yes                      +----------+------------+---------+-----------+----------+-------+ Axillary      None       No        No                Acute  +----------+------------+---------+-----------+----------+-------+ Brachial    Partial      No        No                Acute  +----------+------------+---------+-----------+----------+-------+ Radial        Full                                          +----------+------------+---------+-----------+----------+-------+ Ulnar         Full                                          +----------+------------+---------+-----------+----------+-------+ Cephalic      Full                                           +----------+------------+---------+-----------+----------+-------+ Basilic       Full                                          +----------+------------+---------+-----------+----------+-------+  Left Findings: +----------+------------+---------+-----------+----------+-------+ LEFT      CompressiblePhasicitySpontaneousPropertiesSummary +----------+------------+---------+-----------+----------+-------+ Subclavian    Full       Yes       Yes                      +----------+------------+---------+-----------+----------+-------+  Summary:  Right: No evidence of superficial vein thrombosis in the upper extremity. Findings consistent with acute deep vein thrombosis involving the right axillary vein and right brachial veins.  Left: No evidence of thrombosis in the subclavian.  *See table(s) above for measurements and observations.  Diagnosing physician: Ruta Hinds MD Electronically signed by Ruta Hinds MD on 11/16/2020 at 2:34:45 PM.    Final    DG C-ARM BRONCHOSCOPY  Result Date: 11/17/2020 C-ARM BRONCHOSCOPY: Fluoroscopy was utilized by the requesting physician.  No radiographic interpretation.     Subjective: No acute issues or events overnight, respiratory status improving, denies nausea vomiting diarrhea constipation headache fevers or chills.   Discharge Exam: Vitals:   11/23/20 0410 11/23/20 0832  BP: (!) 142/76   Pulse: 60   Resp: 20   Temp: 97.7 F (36.5 C)   SpO2: 96% 96%   Vitals:   11/22/20 1550 11/22/20 2132 11/23/20 0410 11/23/20 0832  BP: (!) 152/78 (!) 128/56 (!) 142/76   Pulse: 93 90 60   Resp: _0 Temp: 98.1 F (36.7 C) 97.7 F (36.5 C) 97.7 F (36.5 C)   TempSrc: Oral Oral Oral   SpO2: (!) 89% 95% 96% 96%  Weight:      Height:        General: Pt is alert, awake, not in acute distress Cardiovascular: RRR, S1/S2 +, no rubs, no gallops Respiratory: CTA bilaterally, without overt rhonchi or rales, scant end expiratory wheezing apically  bilaterally Abdominal: Soft, NT, ND, bowel sounds + Extremities: no edema, no cyanosis    The results of significant diagnostics from this hospitalization (including imaging, microbiology, ancillary and laboratory) are listed below for reference.     Microbiology: Recent Results (from the past 240 hour(s))  Culture, respiratory     Status: None   Collection Time: 11/17/20 11:48 AM   Specimen: Bronchoalveolar Lavage; Respiratory  Result Value Ref Range Status   Specimen Description   Final    BRONCHIAL ALVEOLAR LAVAGE Performed at [facility] [address], [place], [place] [address]    Special Requests   Final    NONE Performed at [facility], [place] [address] [place], [place] [address]    Gram Stain   Final    RARE WBC PRESENT, PREDOMINANTLY PMN NO ORGANISMS SEEN    Culture   Final    NO GROWTH 2 DAYS Performed at [facility] [address] [place], [place] [address]    Report Status 11/19/2020 FINAL  Final  Acid Fast Smear (AFB)     Status: None   Collection Time: 11/17/20 11:48 AM   Specimen: Bronchial Alveolar Lavage  Result Value Ref Range Status   AFB Specimen Processing Concentration  Final   Acid Fast Smear Negative  Final    Comment: (NOTE) Performed At: [facility] [place], [place] [id number] Rush Farmer MD MC:[id number]    Source (AFB) BRONCHIAL ALVEOLAR LAVAGE  Final    Comment: Performed at [facility], [place] [address] [place], [place] [address]  Fungus Culture With Stain     Status: None (Preliminary result)   Collection Time:  11/17/20 11:48 AM   Specimen: Bronchial Alveolar Lavage  Result Value Ref Range Status   Fungus Stain Final report  Final    Comment: (NOTE) Performed At: [facility] [place], [place] [id number] Rush Farmer MD YM:[id number]    Fungus (Mycology) Culture PENDING  Incomplete   Fungal Source BRONCHIAL  ALVEOLAR LAVAGE  Final    Comment: Performed at [facility], [place] [address], [place], [place] [address]  Fungus Culture Result     Status: None   Collection Time: 11/17/20 11:48 AM  Result Value Ref Range Status   Result 1 Comment  Final    Comment: (NOTE) KOH/Calcofluor preparation:  no fungus observed. Performed At: [facility] [place], [place] [id number] Rush Farmer MD OP:[id number]      Labs: BNP (last 3 results) Recent Labs    11/10/20 1654  BNP 38.1   Basic Metabolic Panel: Recent Labs  Lab 11/17/20 0442 11/18/20 0220 11/19/20 0450 11/20/20 0352 11/21/20 0451 11/22/20 0456  NA 135 133* 134* 131* 131* 133*  K 3.4* 4.1 4.6 4.0 3.9 3.7  CL 101 102 100 98 97* 100  CO2 _0 GLUCOSE 120* 260* 271* 235* 222* 177*  BUN _1 CREATININE 0.60* 0.58* 0.54* 0.58* 0.69 0.84  CALCIUM 8.1* 8.1* 8.4* 8.2* 8.3* 8.0*  MG 1.9 2.0 2.1 2.0 2.2  --    Liver Function Tests: No results for input(s): AST, ALT, ALKPHOS, BILITOT, PROT, ALBUMIN in the last 168 hours. No results for input(s): LIPASE, AMYLASE in the last 168 hours. No results for input(s): AMMONIA in the last 168 hours. CBC: Recent Labs  Lab 11/17/20 0442 11/18/20 0220 11/18/20 1608 11/19/20 0450 11/20/20 0352 11/21/20 0451 11/22/20 0456  WBC 12.2* 13.8*  --  11.5* 16.9* 14.6* 11.3*  NEUTROABS 9.1* 11.5*  --  10.4* 15.4* 13.0*  --   HGB 11.7* 11.1* 12.8* 12.2* 11.0* 11.4* 10.9*  HCT 36.3* 34.2*  --  37.8* 34.2* 34.9* 33.7*  MCV 93.8 92.2  --  92.4 92.4 92.6 92.3  PLT 276 237  --  296 323 360 388   Cardiac Enzymes: No results for input(s): CKTOTAL, CKMB, CKMBINDEX, TROPONINI in the last 168 hours. BNP: Invalid input(s): POCBNP CBG: Recent Labs  Lab 11/22/20 2003 11/22/20 2353 11/23/20 0408 11/23/20 0729 11/23/20 1225  GLUCAP 363* 247* 70 144* 194*   D-Dimer No results for input(s): DDIMER in the last 72 hours. Hgb A1c No  results for input(s): HGBA1C in the last 72 hours. Lipid Profile No results for input(s): CHOL, HDL, LDLCALC, TRIG, CHOLHDL, LDLDIRECT in the last 72 hours. Thyroid function studies No results for input(s): TSH, T4TOTAL, T3FREE, THYROIDAB in the last 72 hours.  Invalid input(s): FREET3 Anemia work up No results for input(s): VITAMINB12, FOLATE, FERRITIN, TIBC, IRON, RETICCTPCT in the last 72 hours. Urinalysis    Component Value Date/Time   COLORURINE YELLOW 01/07/2020 1250   APPEARANCEUR CLEAR 01/07/2020 1250   LABSPEC 1.015 01/07/2020 1250   PHURINE 6.5 01/07/2020 1250   GLUCOSEU NEGATIVE 01/07/2020 1250   HGBUR NEGATIVE 01/07/2020 1250   BILIRUBINUR NEGATIVE 01/07/2020 1250   BILIRUBINUR negative 10/08/2019 1430   KETONESUR 40 (A) 01/07/2020 1250   PROTEINUR NEGATIVE 01/07/2020 1250   UROBILINOGEN negative (A) 10/08/2019 1430   UROBILINOGEN 0.2 04/08/2011 0956   NITRITE NEGATIVE 01/07/2020 1250   LEUKOCYTESUR NEGATIVE 01/07/2020 1250   Sepsis Labs Invalid input(s): PROCALCITONIN,  WBC,  LACTICIDVEN Microbiology Recent Results (from the past 240 hour(s))  Culture, respiratory     Status: None   Collection Time: 11/17/20 11:48 AM   Specimen: Bronchoalveolar Lavage; Respiratory  Result Value Ref Range Status   Specimen Description   Final    BRONCHIAL ALVEOLAR LAVAGE Performed at [facility] [address], [place], [place] [address]    Special Requests   Final    NONE Performed at [facility], [place] [address] [place], [place] [address]    Gram Stain   Final    RARE WBC PRESENT, PREDOMINANTLY PMN NO ORGANISMS SEEN    Culture   Final    NO GROWTH 2 DAYS Performed at [facility] [address], [place], [place] [address]    Report Status 11/19/2020 FINAL  Final  Acid Fast Smear (AFB)     Status: None   Collection Time: 11/17/20 11:48 AM   Specimen: Bronchial Alveolar Lavage  Result Value Ref Range Status   AFB  Specimen Processing Concentration  Final   Acid Fast Smear Negative  Final    Comment: (NOTE) Performed At: [facility] [place], [place] [id number] Rush Farmer MD DH:[id number]    Source (AFB) BRONCHIAL ALVEOLAR LAVAGE  Final    Comment: Performed at [facility], [place] [address], [place], [place] [address]  Fungus Culture With Stain     Status: None (Preliminary result)   Collection Time: 11/17/20 11:48 AM   Specimen: Bronchial Alveolar Lavage  Result Value Ref Range Status   Fungus Stain Final report  Final    Comment: (NOTE) Performed At: [facility] [place], [place] [id number] Rush Farmer MD VK:[id number]    Fungus (Mycology) Culture PENDING  Incomplete   Fungal Source BRONCHIAL ALVEOLAR LAVAGE  Final    Comment: Performed at [facility], [place] [address], [place], [place] [address]  Fungus Culture Result     Status: None   Collection Time: 11/17/20 11:48 AM  Result Value Ref Range Status   Result 1 Comment  Final    Comment: (NOTE) KOH/Calcofluor preparation:  no fungus observed. Performed At: [facility] [place], [place] [id number] Rush Farmer MD OL:[id number]      Time coordinating discharge: Over 30 minutes  SIGNED:   Little Ishikawa, DO [facility] 11/23/2020, 8:14 PM Pager   If 7PM-7AM, please contact night-coverage [email or web address]

## 2020-11-24 ENCOUNTER — Other Ambulatory Visit: Payer: Self-pay | Admitting: Medical

## 2020-11-24 ENCOUNTER — Telehealth: Payer: Self-pay

## 2020-11-24 NOTE — Telephone Encounter (Signed)
Transition Care Management Follow-up Telephone Call  Date of discharge and from where: 11/23/20[facility]  How have you been since you were released from the hospital? Doing ok, using O2, eating well. Still feeling a little short of breath.  Any questions or concerns? No  Items Reviewed:  Did the pt receive and understand the discharge instructions provided? Yes   Medications obtained and verified? Yes   Other? Yes   Any new allergies since your discharge? No   Dietary orders reviewed? Yes  Do you have support at home? Yes   Home Care and Equipment/Supplies: Were home health services ordered? yes If so, what is the name of the agency? [facility] Has the agency set up a time to come to the patient's home? no Were any new equipment or medical supplies ordered?  Yes: oxygen What is the name of the medical supply agency? [facility] Were you able to get the supplies/equipment? yes Do you have any questions related to the use of the equipment or supplies? No  Functional Questionnaire: (I = Independent and D = Dependent) ADLs: I with assistance  Bathing/Dressing- I  Meal Prep- D  Eating- I  Maintaining continence- I  Transferring/Ambulation- I with rollator walker  Managing Meds- I  Follow up appointments reviewed:   PCP Hospital f/u appt confirmed? Yes  Scheduled to see Mackie Pai on 12/07/20 @ 3:00.  [facility] confirmed? Yes  Scheduled to see Dr Volanda Napoleon on 12/01/20 @ 11:30.  Are transportation arrangements needed? No   If their condition worsens, is the pt aware to call PCP or go to the Emergency Dept.? Yes  Was the patient provided with contact information for the PCP's office or ED? Yes  Was to pt encouraged to call back with questions or concerns? Yes

## 2020-11-27 DIAGNOSIS — Z7982 Long term (current) use of aspirin: Secondary | ICD-10-CM | POA: Diagnosis not present

## 2020-11-27 DIAGNOSIS — J9601 Acute respiratory failure with hypoxia: Secondary | ICD-10-CM | POA: Diagnosis not present

## 2020-11-27 DIAGNOSIS — I82409 Acute embolism and thrombosis of unspecified deep veins of unspecified lower extremity: Secondary | ICD-10-CM | POA: Diagnosis not present

## 2020-11-27 DIAGNOSIS — M5137 Other intervertebral disc degeneration, lumbosacral region: Secondary | ICD-10-CM | POA: Diagnosis not present

## 2020-11-27 DIAGNOSIS — I10 Essential (primary) hypertension: Secondary | ICD-10-CM | POA: Diagnosis not present

## 2020-11-27 DIAGNOSIS — Z7984 Long term (current) use of oral hypoglycemic drugs: Secondary | ICD-10-CM | POA: Diagnosis not present

## 2020-11-27 DIAGNOSIS — E1142 Type 2 diabetes mellitus with diabetic polyneuropathy: Secondary | ICD-10-CM | POA: Diagnosis not present

## 2020-11-27 DIAGNOSIS — G4700 Insomnia, unspecified: Secondary | ICD-10-CM | POA: Diagnosis not present

## 2020-11-27 DIAGNOSIS — E871 Hypo-osmolality and hyponatremia: Secondary | ICD-10-CM | POA: Diagnosis not present

## 2020-11-27 DIAGNOSIS — E785 Hyperlipidemia, unspecified: Secondary | ICD-10-CM | POA: Diagnosis not present

## 2020-11-28 ENCOUNTER — Other Ambulatory Visit: Payer: Self-pay | Admitting: Medical

## 2020-11-30 ENCOUNTER — Other Ambulatory Visit: Payer: Self-pay | Admitting: Medical

## 2020-11-30 DIAGNOSIS — M4628 Osteomyelitis of vertebra, sacral and sacrococcygeal region: Secondary | ICD-10-CM | POA: Diagnosis not present

## 2020-11-30 DIAGNOSIS — M150 Primary generalized (osteo)arthritis: Secondary | ICD-10-CM | POA: Diagnosis not present

## 2020-11-30 DIAGNOSIS — G894 Chronic pain syndrome: Secondary | ICD-10-CM | POA: Diagnosis not present

## 2020-11-30 DIAGNOSIS — M961 Postlaminectomy syndrome, not elsewhere classified: Secondary | ICD-10-CM | POA: Diagnosis not present

## 2020-11-30 LAB — MISC LABCORP TEST (SEND OUT): Labcorp test code: 9985

## 2020-11-30 NOTE — Telephone Encounter (Signed)
Proventil rx sent to pt pharmacy.

## 2020-11-30 NOTE — Telephone Encounter (Signed)
Pharmacy is requesting to change to Proventil from Ventolin.  If ok please approve.

## 2020-12-01 ENCOUNTER — Encounter: Payer: Self-pay | Admitting: Medical

## 2020-12-01 ENCOUNTER — Encounter: Payer: Self-pay | Admitting: Primary Care

## 2020-12-01 ENCOUNTER — Other Ambulatory Visit: Payer: Self-pay

## 2020-12-01 ENCOUNTER — Ambulatory Visit: Payer: Medicare Other | Admitting: Primary Care

## 2020-12-01 VITALS — BP 108/64 | HR 63 | Temp 97.3°F | Ht 70.0 in | Wt 156.6 lb

## 2020-12-01 DIAGNOSIS — G4700 Insomnia, unspecified: Secondary | ICD-10-CM

## 2020-12-01 DIAGNOSIS — J9601 Acute respiratory failure with hypoxia: Secondary | ICD-10-CM | POA: Diagnosis not present

## 2020-12-01 DIAGNOSIS — I82409 Acute embolism and thrombosis of unspecified deep veins of unspecified lower extremity: Secondary | ICD-10-CM | POA: Diagnosis not present

## 2020-12-01 DIAGNOSIS — J189 Pneumonia, unspecified organism: Secondary | ICD-10-CM | POA: Diagnosis not present

## 2020-12-01 DIAGNOSIS — M5137 Other intervertebral disc degeneration, lumbosacral region: Secondary | ICD-10-CM | POA: Diagnosis not present

## 2020-12-01 DIAGNOSIS — I10 Essential (primary) hypertension: Secondary | ICD-10-CM | POA: Diagnosis not present

## 2020-12-01 DIAGNOSIS — F419 Anxiety disorder, unspecified: Secondary | ICD-10-CM | POA: Diagnosis not present

## 2020-12-01 DIAGNOSIS — Z7984 Long term (current) use of oral hypoglycemic drugs: Secondary | ICD-10-CM | POA: Diagnosis not present

## 2020-12-01 DIAGNOSIS — E1142 Type 2 diabetes mellitus with diabetic polyneuropathy: Secondary | ICD-10-CM | POA: Diagnosis not present

## 2020-12-01 DIAGNOSIS — E871 Hypo-osmolality and hyponatremia: Secondary | ICD-10-CM | POA: Diagnosis not present

## 2020-12-01 DIAGNOSIS — E785 Hyperlipidemia, unspecified: Secondary | ICD-10-CM | POA: Diagnosis not present

## 2020-12-01 DIAGNOSIS — Z7982 Long term (current) use of aspirin: Secondary | ICD-10-CM | POA: Diagnosis not present

## 2020-12-01 MED ORDER — ALPRAZOLAM 0.5 MG PO TABS: 0.5000 mg | ORAL_TABLET | Freq: Two times a day (BID) | ORAL | 0 refills | Status: DC | PRN

## 2020-12-01 MED ORDER — PREDNISONE 10 MG PO TABS: ORAL_TABLET | ORAL | 0 refills | Status: AC

## 2020-12-01 NOTE — Assessment & Plan Note (Addendum)
-   Oral Steroids are causing some insomnia. He take Ambien 5mg  prn at bedtime. Advised not to combine with other sedation medication or alcohol. Medication is not managed by our office

## 2020-12-01 NOTE — Assessment & Plan Note (Signed)
-   O2 saturation on ambulatory walk test today remained above 95% RA. Continue to recommend he use 1-2L with mdoerate exertion and at night. We will check ONO RA.

## 2020-12-01 NOTE — Assessment & Plan Note (Addendum)
Improving; Continues to have some sob, wheezing and cough. Rarely using SABA. Lungs with scant rales right base; otherwise clear. He is requiring less oxygen.  We will begin to taper prednisone by 10mg  qweek. Continue Bactrim per ID, if they decide to discontinue he should stay on MWF for PJP prophylaxis. Needs PFTs and CT chest wo contrast. He has follow-up on June 14th with Dr. Chase Caller.     Recommendations:  - Taper prednisone 50mg  x 1 week; 40mg  x 1 week; 30mg  x 1 week; 20mg  x 1 week; 10mg  x 1 week  - Continue Bactrim as directed by ID (if they want to stop abx please send [place] a mychart message, we would recommend continuing MWF dosing while on prednisone >20 mg) - Use albuterol rescue inhaler twice a day for the next week or two and prn every 6 hours for breakthrough shortness of breath  - Continue to use flutter valve and Incentive spirometer 2-3 times a day  Orders: - PFTs first available  - CT chest wo contrast re: fu persistent pneumonia/ drug induced pneumonitis  - ONO on room air   Follow-up: - June with Dr. Brantley Persons June 14th at 11:30am

## 2020-12-01 NOTE — Patient Instructions (Addendum)
Recommendations: - Taper prednisone 50mg  x 1 week; 40mg  x 1 week; 30mg  x 1 week; 20mg  x 1 week; 10mg  x 1 week  - Continue Bactrim as directed by ID (if they want to stop abx please send [place] a mychart message, we would recommend continuing MWF dosing while on prednisone >20 mg) - use albuterol rescue inhaler twice a day for the next week or two and prn every 6 hours for breakthrough shortness of breath  - Continue to use flutter valve and Incentive spirometer 2-3 times a day - Continue Oxygen _______2L______; goal maintain O2 >88-90%  - We will provider 10 tabs Xanax until you can be seen by PCP   Orders: - Ambulatory walk test today on RA; if desaturated < 88% please titrate O2 to maintain level >88-90%  - PFTs first available (ordered) - CT chest wo contrast re: fu persistent pneumonia/ drug induced pneumonitis (ordered) - ONO on room air   Follow-up: - June with Luis Brown June 14th at 11:30am

## 2020-12-01 NOTE — Assessment & Plan Note (Signed)
-   Anxiety d/t shortness of breath. Discharged from hospital with short term supply of xanax. He will run out 3 days before he will be able to PCP next week who will manage medication moving forward. He is using appropriately without signs of abuse. PMP reviewed. We will refill Xanax 0.5mg  BID prn #10 tabs

## 2020-12-01 NOTE — Progress Notes (Signed)
_0  ID: Luis Brown, male    DOB: June 17, 1946, 75 y.o.   MRN: [id number]  Chief Complaint  Patient presents with  . Follow-up    Requesting recommendations for shortness of breath    Referring provider: Elise Benne  HPI: 75 year old male, former smoker (20-pack-year history).  Past medical history significant hypertension, DVT, drug-induced pneumonitis, eosinophilic pneumonia, acute respiratory failure with hypoxia, GERD, gastric ulcer, alcohol abuse, protein calorie malnutrition, hyperlipidemia, anxiety/depression.  Patient was recently admitted to [facility] from 11/10/2020 - 11/23/2020 for drug induced pneumonitis.  Hospital course 11/10/2020 - 11/23/2020: Patient presented to the hospital shortness of breath.  He was originally seen at PCPs office and found to be hypoxic.  He was referred to emergency room.  He underwent work-up with CTA chest which showed large right sided pneumonia with groundglass nodules and concern for central cavitation.  He was started on IV antibiotics on admission.  Infectious disease was consulted given prolonged antibiotic course at home and now with atypical pneumonia.  Echocardiogram was negative for valvular vegetation.  MRI was negative for deep infection but revealed a subcutaneous probable seroma.  IR was consulted per ID for aspiration/drainage and culturing of fluid.  He underwent aspiration on 11/13/2020.  Fluid cultures obtained showing no growth.  Patient hospital course complicated by right upper extremity DVT secondary to PICC line.  Patient developed worsening respiratory symptoms and increasing oxygen requirements.  He underwent repeat CT chest which showed worsening right-sided pneumonia.  Pulmonary was consulted and he underwent bronchoscopy on 11/17/2020.  Bronchial washings were generally unremarkable.  There was discussion regarding possibly underlying daptomycin pneumonitis.  Daptomycin was discontinued when cultures patient was  de-escalated to Bactrim per ID recommendations.  Patient will follow-up outpatient with ID for ongoing antibiotic management.  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  Patient was discharged home on oxygen.  Will remain on steroids until follow-up with pulmonary on May 17.  He was discharged on Xarelto, given its provoked nature will likely be able to discontinue once imaging is negative in the next 3 to 6 months per protocol.  Patient was discharged on Bactrim and Xanax given profound anxiety in relation to dyspnea.  Patient is aware Xanax is not and should not be permanent fixture or addition to his home medication list and will need follow-up with PCP to manage prescriptions further.   12/01/2020 -Remains on prednisone 47m daily until follow-up and assess when to taper dose -Continues Bactim BID -FOB on 5/3 with negtive pathology, BAL negative -Follow up with Dr. RChase Callerin [facility] clinic in June   Patient reports that he is a little better.  He continues to have some shortness of breath with occasional wheezing.  He has a productive cough which has improved.  Underlying anxiety due to dyspnea symptoms.  He has had no emergencies requiring his albuterol rescue inhaler.  He used this twice since discharge.  He is using flutter valve and incentive spirometer.  He is still on Bactrim and has follow-up with infectious disease in the next week.  Reports difficulty sleeping at night due to steroid.  He has Ambien 5 mg as needed insomnia.  He is using Xanax twice a day as needed for anxiety.  He will follow-up with primary care next Monday. His appetite has improved some. Drinking protein shakes.  Denies fever chills sweats, chest tightness or chest pain.  Imaging:  11/23/20 CXR - Persistent right pulmonary opacities  and right pleural effusion.Aeration improved compared to 11/17/2020.   No Known Allergies  Immunization History   Administered Date(s) Administered  . Fluad Quad(high Dose 65+) 04/05/2019, 06/29/2020  . Influenza Split 04/23/2012  . Influenza Whole 02/16/2011  . Influenza, High Dose Seasonal PF 05/06/2013, 03/25/2016, 05/05/2017, 06/07/2018  . PFIZER(Purple Top)SARS-COV-2 Vaccination 08/23/2019, 09/13/2019  . Pneumococcal Conjugate-13 03/25/2016  . Pneumococcal Polysaccharide-23 01/20/2009, 10/19/2011  . Td 07/09/2003, 07/18/2008  . Tdap 07/20/2011  . Zoster 04/27/2016    Past Medical History:  Diagnosis Date  . Acute pharyngitis 10/21/2013  . Allergy    grass and pollen  . Anxiety and depression 10/25/2011  . BPH (benign prostatic hyperplasia) 04/23/2012  . Chicken pox as a child  . DDD (degenerative disc disease)    cervical responds to steroid injections and low back required surgery  . DDD (degenerative disc disease), lumbosacral   . Diabetes mellitus    pre  . Dyspnea   . ED (erectile dysfunction) 04/23/2012  . Elevated BP   . 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 Esophageal reflux 02/10/2015  . Fatigue   . HTN (hypertension)   . Hyperglycemia    preDM   . Hyperlipidemia   . Insomnia   . Low back pain radiating to both legs 01/16/2017  . Measles as a child  . Overweight(278.02)   . Peripheral neuropathy 08/07/2019  . Personal history of colonic polyps 10/27/2012   Follows with [facility]  . Pneumonia    " walking"  . Preventative health care   . 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  . Wears glasses     Tobacco History: Social History   Tobacco Use  Smoking Status Former Smoker  . Packs/day: 1.00  . Years: 20.00  . Pack years: 20.00  . Types: Cigarettes  . Quit date: 07/18/1989  . Years since quitting: 31.3  Smokeless Tobacco Never Used  Tobacco Comment   off and on smoking for the 20 yrs   Counseling given: Not Answered Comment: off and on smoking for the 20 yrs   Outpatient Medications Prior to Visit  Medication Sig Dispense Refill  . atorvastatin (LIPITOR) 10  MG tablet TAKE 1 TABLET BY MOUTH EVERY DAY 30 tablet 0  . b complex vitamins capsule Take 1 capsule by mouth daily.    Marland Kitchen BAYER ASPIRIN EC LOW DOSE 81 MG EC tablet Take 81 mg by mouth every morning.    . busPIRone (BUSPAR) 7.5 MG tablet 1-2 TAB BY MOUTH 3 TIMES A DAY FOR ANXIETY (Patient taking differently: Take 7.5 mg by mouth 2 (two) times daily.) 540 tablet 1  . cetirizine (ZYRTEC) 10 MG tablet Take 10 mg by mouth daily. In the morning    . dextromethorphan-guaiFENesin (MUCINEX DM) 30-600 MG 12hr tablet Take 1 tablet by mouth 2 (two) times daily. 40 tablet 0  . famotidine (PEPCID) 40 MG tablet TAKE 1 TABLET BY MOUTH EVERYDAY AT BEDTIME (Patient taking differently: Take 40 mg by mouth at bedtime.) 90 tablet 1  . ferrous fumarate (FERRETTS) 325 (106 Fe) MG TABS tablet Take 1 tablet (106 mg of iron total) by mouth daily. 90 tablet 3  . glucose blood (CONTOUR NEXT TEST) test strip Check blood sugar once daily 100 each 12  . HYDROcodone-acetaminophen (NORCO) 7.5-325 MG tablet Take 1 tablet by mouth every 4 (four) hours as needed for up to 7 days for moderate pain ((score 4 to 6)). 40 tablet 0  . JANUVIA 50 MG tablet TAKE 1 TABLET BY MOUTH EVERY DAY  30 tablet 3  . metFORMIN (GLUCOPHAGE) 1000 MG tablet Take 1 tablet (1,000 mg total) by mouth 2 (two) times daily with a meal. 180 tablet 0  . olmesartan (BENICAR) 5 MG tablet Take 1 tablet (5 mg total) by mouth daily. 30 tablet 2  . pantoprazole (PROTONIX) 40 MG tablet TAKE 1 TABLET BY MOUTH TWICE A DAY BEFORE A MEAL (Patient taking differently: Take 40 mg by mouth 2 (two) times daily before a meal.) 180 tablet 1  . pregabalin (LYRICA) 75 MG capsule TAKE 1 CAPSULE BY MOUTH TWICE A DAY (Patient taking differently: Take 75 mg by mouth 2 (two) times daily.) 60 capsule 3  . PROVENTIL HFA 108 (90 Base) MCG/ACT inhaler TAKE 2 PUFFS BY MOUTH EVERY 6 HOURS AS NEEDED FOR WHEEZE OR SHORTNESS OF BREATH 6.7 each 5  . Rivaroxaban (XARELTO) 15 MG TABS tablet Take 1 tablet  (15 mg total) by mouth 2 (two) times daily with a meal for 16 days. 32 tablet 0  . [START ON 12/10/2020] rivaroxaban (XARELTO) 20 MG TABS tablet Take 1 tablet (20 mg total) by mouth daily with supper. 30 tablet 0  . sodium chloride (OCEAN) 0.65 % SOLN nasal spray Place 1 spray into both nostrils as needed for congestion. 20 mL 0  . sucralfate (CARAFATE) 1 g tablet Take 1 tablet (1 g total) by mouth 4 (four) times daily -  with meals and at bedtime. 120 tablet 1  . sulfamethoxazole-trimethoprim (BACTRIM DS) 800-160 MG tablet Take 1 tablet by mouth every 12 (twelve) hours. 60 tablet 0  . tamsulosin (FLOMAX) 0.4 MG CAPS capsule Take 2 capsules (0.8 mg total) by mouth daily. 180 capsule 3  . testosterone cypionate (DEPOTESTOSTERONE CYPIONATE) 200 MG/ML injection INJECT 1 ML (200 MG TOTAL) INTO THE MUSCLE EVERY 14 (FOURTEEN) DAYS. 2 mL 0  . tiZANidine (ZANAFLEX) 4 MG tablet Take 1.5 tablets (6 mg total) by mouth every 6 (six) hours as needed for muscle spasms. (Patient taking differently: Take 6 mg by mouth every 4 (four) hours as needed for muscle spasms.) 540 tablet 1  . venlafaxine XR (EFFEXOR-XR) 150 MG 24 hr capsule TAKE 1 CAPSULE (150 MG TOTAL) BY MOUTH DAILY WITH BREAKFAST. 90 capsule 1  . zolpidem (AMBIEN) 5 MG tablet Take 1 tablet (5 mg total) by mouth at bedtime as needed for sleep. 30 tablet 0  . ALPRAZolam (XANAX) 0.5 MG tablet Take 1 tablet (0.5 mg total) by mouth 2 (two) times daily as needed for up to 10 days for anxiety. 20 tablet 0  . predniSONE (DELTASONE) 20 MG tablet Take 3 tablets (60 mg total) by mouth daily with breakfast. 60 tablet 0   Facility-Administered Medications Prior to Visit  Medication Dose Route Frequency Provider Last Rate Last Admin  . testosterone cypionate (DEPOTESTOSTERONE CYPIONATE) injection 200 mg  200 mg Intramuscular Q14 Days Saguier, Percell Miller, PA-C   200 mg at 09/11/20 [id number]   Review of Systems  Review of Systems  Constitutional: Positive for fatigue.  HENT:  Negative.   Respiratory: Positive for cough and shortness of breath. Negative for chest tightness and wheezing.   Cardiovascular: Negative.      Physical Exam  BP 108/64 (BP Location: Left Arm, Cuff Size: Normal)   Pulse 63   Temp (!) 97.3 F (36.3 C) (Temporal)   Ht _0  (1.778 m)   Wt 156 lb 9.6 oz (71 kg)   SpO2 100% Comment: 3L  BMI 22.47 kg/m  Physical Exam Constitutional:  Appearance: Normal appearance.     Comments: Appears underweight   HENT:     Head: Normocephalic and atraumatic.     Mouth/Throat:     Mouth: Mucous membranes are moist.     Pharynx: Oropharynx is clear.  Cardiovascular:     Rate and Rhythm: Normal rate and regular rhythm.     Comments: No edema Pulmonary:     Effort: Pulmonary effort is normal. No respiratory distress.     Breath sounds: No wheezing or rhonchi.     Comments: Mostly clear, very faint rales right lower base  Musculoskeletal:     Cervical back: Normal range of motion and neck supple. No rigidity or tenderness.     Comments: Weak, amb with assisted device   Lymphadenopathy:     Cervical: No cervical adenopathy.  Skin:    General: Skin is warm and dry.  Neurological:     General: No focal deficit present.     Mental Status: He is alert and oriented to person, place, and time. Mental status is at baseline.  Psychiatric:        Mood and Affect: Mood normal.        Behavior: Behavior normal.        Thought Content: Thought content normal.        Judgment: Judgment normal.      Lab Results:  CBC    Component Value Date/Time   WBC 11.3 (H) 11/22/2020 0456   RBC 3.65 (L) 11/22/2020 0456   HGB 10.9 (L) 11/22/2020 0456   HGB 12.8 (L) 11/18/2020 1608   HCT 33.7 (L) 11/22/2020 0456   PLT 388 11/22/2020 0456   MCV 92.3 11/22/2020 0456   MCH 29.9 11/22/2020 0456   MCHC 32.3 11/22/2020 0456   RDW 14.6 11/22/2020 0456   LYMPHSABS 0.6 (L) 11/21/2020 0451   MONOABS 0.6 11/21/2020 0451   EOSABS 0.0 11/21/2020 0451    BASOSABS 0.0 11/21/2020 0451    BMET    Component Value Date/Time   NA 133 (L) 11/22/2020 0456   K 3.7 11/22/2020 0456   CL 100 11/22/2020 0456   CO2 25 11/22/2020 0456   GLUCOSE 177 (H) 11/22/2020 0456   BUN 20 11/22/2020 0456   CREATININE 0.84 11/22/2020 0456   CREATININE 0.78 08/17/2020 1458   CALCIUM 8.0 (L) 11/22/2020 0456   GFRNONAA >60 11/22/2020 0456   GFRNONAA 88 08/17/2020 1458   GFRAA 102 08/17/2020 1458    BNP    Component Value Date/Time   BNP 68.6 11/10/2020 1654    ProBNP No results found for: PROBNP  Imaging: DG Chest 2 View  Result Date: 11/23/2020 CLINICAL DATA:  Pneumonia EXAM: CHEST - 2 VIEW COMPARISON:  11/17/2020 FINDINGS: Persistent right pulmonary opacities and right pleural effusion. Aeration is improved relative to prior study. Left lung is clear apart from some scarring at the lung base. No pneumothorax. Similar cardiomediastinal contours. IMPRESSION: Persistent right pulmonary opacities and right pleural effusion. Aeration improved compared to 11/17/2020. Electronically Signed   By: Macy Mis M.D.   On: 11/23/2020 09:28   CT ANGIO CHEST PE W OR WO CONTRAST  Result Date: 11/15/2020 CLINICAL DATA:  Chest pain and shortness of breath. EXAM: CT ANGIOGRAPHY CHEST WITH CONTRAST TECHNIQUE: Multidetector CT imaging of the chest was performed using the standard protocol during bolus administration of intravenous contrast. Multiplanar CT image reconstructions and MIPs were obtained to evaluate the vascular anatomy. CONTRAST:  176m OMNIPAQUE IOHEXOL 350 MG/ML SOLN COMPARISON:  11/10/2020  FINDINGS: Cardiovascular: Heart size upper normal to mildly increased. Coronary artery calcification is evident. Atherosclerotic calcification is noted in the wall of the thoracic aorta. There is no filling defect within the opacified pulmonary arteries to suggest the presence of an acute pulmonary embolus. Mediastinum/Nodes: Mediastinal lymphadenopathy again noted. 13 mm  precarinal lymph node evident. 13 mm short axis subcarinal lymph node. Mild right hilar lymphadenopathy. The esophagus has normal imaging features. There is no axillary lymphadenopathy. Lungs/Pleura: Interval progression of diffuse airspace disease in the right lung with ground-glass and consolidative components involving all 3 lobes. Small right pleural effusion associated. There is some minimal airspace disease in the anterior left upper lobe, similar to prior. Upper Abdomen: Unremarkable. Musculoskeletal: Extensive thoracolumbar fusion, incompletely visualized with evidence of prior hardware retrieval in the thoracic spine. Review of the MIP images confirms the above findings. IMPRESSION: 1. No CT evidence for acute pulmonary embolus. 2. Interval progression of diffuse airspace disease in the right lung with ground-glass and consolidative components involving all 3 lobes. Imaging features compatible with multifocal pneumonia. 3. Small right pleural effusion. 4. Mediastinal and right hilar lymphadenopathy, likely reactive. 5. Aortic Atherosclerosis (ICD10-I70.0). Electronically Signed   By: Misty Stanley M.D.   On: 11/15/2020 09:17   CT Angio Chest PE W/Cm &/Or Wo Cm  Result Date: 11/10/2020 CLINICAL DATA:  75 year old male with concern for pulmonary embolism. EXAM: CT ANGIOGRAPHY CHEST WITH CONTRAST TECHNIQUE: Multidetector CT imaging of the chest was performed using the standard protocol during bolus administration of intravenous contrast. Multiplanar CT image reconstructions and MIPs were obtained to evaluate the vascular anatomy. CONTRAST:  95m OMNIPAQUE IOHEXOL 350 MG/ML SOLN COMPARISON:  Chest radiograph dated 11/10/2020 and CT dated 12/25/2019. FINDINGS: Cardiovascular: There is no cardiomegaly or pericardial effusion. There is coronary vascular calcification of the LAD. The thoracic aorta is unremarkable. The origins of the great vessels of the aortic arch appear patent as visualized. Evaluation of  the pulmonary arteries is limited due to respiratory motion artifact and streak artifact caused by spinal metallic hardware. No pulmonary artery embolus identified. Mediastinum/Nodes: Right hilar adenopathy measures 15 mm in short axis. Mildly enlarged lymph node anterior to the trachea measures 10 mm short axis. The esophagus and the thyroid gland are grossly unremarkable. No mediastinal fluid collection. Right-sided PICC with tip at the cavoatrial junction. Lungs/Pleura: There is background of mild centrilobular emphysema. Large patchy area of interstitial coarsening and diffuse ground-glass opacity primarily involving the lower lung zone and peripheral and subpleural lung new since the prior CT and most consistent with pneumonia, likely atypical in etiology. Clinical correlation and follow-up to resolution recommended. Several subpleural ground-glass nodules may have central cavitation and therefore septic emboli are not excluded. A 5 mm left lower lobe nodule appears similar to prior CT. There is a small right pleural effusion. No pneumothorax. The central airways are patent. Upper Abdomen: No acute abnormality. Musculoskeletal: Degenerative changes, osteopenia, and extensive spinal fusion. No acute osseous pathology. Review of the MIP images confirms the above findings. IMPRESSION: 1. No CT evidence of pulmonary embolism. 2. Findings most consistent with pneumonia, likely atypical in etiology. Clinical correlation and follow-up to resolution recommended. 3. Small right pleural effusion. 4. Aortic Atherosclerosis (ICD10-I70.0) and Emphysema (ICD10-J43.9). Electronically Signed   By: AAnner CreteM.D.   On: 11/10/2020 18:32   MR THORACIC SPINE W WO CONTRAST  Result Date: 11/11/2020 CLINICAL DATA:  Epidural abscess. EXAM: MRI THORACIC WITHOUT AND WITH CONTRAST TECHNIQUE: Multiplanar and multiecho pulse sequences of the thoracic spine  were obtained without and with intravenous contrast. CONTRAST:  46m  GADAVIST GADOBUTROL 1 MMOL/ML IV SOLN COMPARISON:  MRI of the thoracic spine April 23, 2020 FINDINGS: Alignment:  Maintained. Vertebrae: Postsurgical changes from posterior fixation from T7 through the visualized upper lumbar spine with interval removal of T5 and T6 posterior hardware. Susceptibility artifact from the hardware limits evaluation, particularly in the lower thoracic region. Endplate destructive changes at T4-5 appear stable with improved marrow edema. Stable appearance of the T9-10 effusion. No new focus suspicious for discitis/osteomyelitis. No evidence of an epidural abscess. Cord: Evaluation limited by susceptibility artifact. No obvious cord lesion. Paraspinal and other soft tissues: New fluid collection in the subcutaneous from the T3-4 through the T6-7 level measuring up to 6 cm in length by 2.3 x 5.9 largest transverse and anteroposterior dimensions, likely related to seroma. Extensive right lung consolidation better described on recent chest CT. Disc levels: No high-grade spinal canal stenosis from T1-2 through T9-10. T10-11 and T11-12 levels are obscured by susceptibility artifact. No neural foraminal narrowing from T1-2 through T7-8. Evaluation of the neural foramen below T7-8 is limited by susceptibility artifact. IMPRESSION: 1. Postsurgical changes from posterior fixation from T7 through the visualized upper lumbar spine with interval removal of T5 and T6 posterior hardware. Susceptibility artifact from the hardware limits evaluation, particularly in the lower thoracic region. 2. Endplate destructive changes at T4-5 appear stable with improved marrow edema. Stable appearance of the T9-10 effusion. No evidence of an epidural abscess. 3. New fluid collection in the subcutaneous from the T3-4 through the T6-7 level measuring up to 6 cm in length by 2.3 x 5.9 cm, likely a seroma. 4. Extensive right lung consolidation better described on recent chest CT. Electronically Signed   By: KPedro EarlsM.D.   On: 11/11/2020 13:47   IR [place] Bx Asp/Drain  Result Date: 11/13/2020 INDICATION: Thoracic spine paraspinous small fluid collection postop early EXAM: ULTRASOUND THORACIC PARASPINOUS FLUID COLLECTION ASPIRATION MEDICATIONS: 1% LIDOCAINE LOCAL ANESTHESIA/SEDATION: Moderate Sedation Time:  None. The patient was continuously monitored during the procedure by the interventional radiology nurse under my direct supervision. COMPLICATIONS: None immediate. PROCEDURE: Informed written consent was obtained from the patient after a thorough discussion of the procedural risks, benefits and alternatives. All questions were addressed. Maximal Sterile Barrier Technique was utilized including caps, mask, sterile gowns, sterile gloves, sterile drape, hand hygiene and skin antiseptic. A timeout was performed prior to the initiation of the procedure. Previous imaging reviewed. Patient position prone. Preliminary ultrasound performed. The thin paraspinous free fluid about the spinous processes of the mid upper thoracic spine was localized and marked. Under sterile conditions and local anesthesia, an 18 gauge needle was advanced to the paraspinous thoracic fluid. Syringe aspiration yielded 3 cc thin amber serosanguineous fluid. Sample sent for culture. Needle removed. No immediate complication. Patient tolerated the procedure well. IMPRESSION: Successful ultrasound needle aspiration of the upper thoracic paraspinous free fluid Electronically Signed   By: MJerilynn Mages  Shick M.D.   On: 11/13/2020 09:23   DG CHEST PORT 1 VIEW  Result Date: 11/17/2020 CLINICAL DATA:  Status post bronchoscopy EXAM: PORTABLE CHEST 1 VIEW COMPARISON:  November 10, 2020 chest radiograph; chest CT Nov 15, 2020 FINDINGS: Central catheter no longer present. Widespread airspace opacity persists throughout the right lung. Small right pleural effusion. Airspace consolidation noted throughout portions of the right mid and lower lung regions as  well as right apex. Left lung is clear. Heart size is normal. Pulmonary vascularity within  normal limits. No adenopathy is appreciable by radiography. Postoperative changes noted in much of the mid and lower thoracic and visualized upper lumbar spine regions. IMPRESSION: Extensive airspace opacity throughout the right lung with areas of consolidation in the right apex as well as in the right lower lung region. Small right pleural effusion. Left lung clear. Stable cardiac silhouette. No evident pneumothorax. Electronically Signed   By: Lowella Grip III M.D.   On: 11/17/2020 14:26   DG Chest Port 1 View  Result Date: 11/10/2020 CLINICAL DATA:  75 year old male with shortness of breath. EXAM: PORTABLE CHEST 1 VIEW COMPARISON:  Chest radiograph dated 01/07/2020. FINDINGS: Right-sided PICC with tip over the central SVC close to the cavoatrial junction. Right-sided pulmonary opacities predominantly involving the peripheral and subpleural lung, new since the prior radiograph, and most consistent with pneumonia and possibly viral or atypical in etiology. Clinical correlation and follow-up to resolution is recommended. The left lung is clear. No pleural effusion or pneumothorax. The cardiac silhouette is within limits. Spinal fusion hardware. No acute osseous pathology. IMPRESSION: Right-sided pulmonary opacities most consistent with pneumonia. Clinical correlation and follow-up to resolution is recommended. Electronically Signed   By: Anner Crete M.D.   On: 11/10/2020 17:25   ECHOCARDIOGRAM COMPLETE  Result Date: 11/11/2020    ECHOCARDIOGRAM REPORT   Patient Name:   Luis Brown Date of Exam: 11/11/2020 Medical Rec #:  [id number]        Height:       70.0 in Accession #:    [id number]       Weight:       167.3 lb Date of Birth:  1946/01/01         BSA:          1.935 m Patient Age:    22 years         BP:           114/68 mmHg Patient Gender: M                HR:           102 bpm. Exam Location:   Inpatient Procedure: 2D Echo Indications:    Bacteremia  History:        Patient has no prior history of Echocardiogram examinations.                 Risk Factors:Hypertension, Dyslipidemia and Diabetes.  Sonographer:    Mikki Santee RDCS (AE) Referring Phys: [place]  1. Left ventricular ejection fraction, by estimation, is 60 to 65%. The left ventricle has normal function. The left ventricle has no regional wall motion abnormalities. Left ventricular diastolic parameters were normal.  2. Right ventricular systolic function is normal. The right ventricular size is mildly enlarged.  3. The mitral valve is normal in structure. No evidence of mitral valve regurgitation.  4. The aortic valve was not well visualized. Aortic valve regurgitation is not visualized. No aortic stenosis is present.  5. The inferior vena cava is normal in size with greater than 50% respiratory variability, suggesting right atrial pressure of 3 mmHg. Conclusion(s)/Recommendation(s): No vegetation seen. If high clinical suspicion for endocarditis consider TEE. FINDINGS  Left Ventricle: Left ventricular ejection fraction, by estimation, is 60 to 65%. The left ventricle has normal function. The left ventricle has no regional wall motion abnormalities. The left ventricular internal cavity size was normal in size. There is  no left ventricular hypertrophy. Left ventricular diastolic parameters were  normal. Right Ventricle: The right ventricular size is mildly enlarged. No increase in right ventricular wall thickness. Right ventricular systolic function is normal. Left Atrium: Left atrial size was normal in size. Right Atrium: Right atrial size was normal in size. Pericardium: Trivial pericardial effusion is present. Mitral Valve: The mitral valve is normal in structure. No evidence of mitral valve regurgitation. Tricuspid Valve: The tricuspid valve is normal in structure. Tricuspid valve regurgitation is not  demonstrated. Aortic Valve: The aortic valve was not well visualized. Aortic valve regurgitation is not visualized. No aortic stenosis is present. Pulmonic Valve: The pulmonic valve was not well visualized. Pulmonic valve regurgitation is not visualized. Aorta: The aortic root is normal in size and structure. Venous: The inferior vena cava is normal in size with greater than 50% respiratory variability, suggesting right atrial pressure of 3 mmHg. IAS/Shunts: The interatrial septum was not well visualized.  LEFT VENTRICLE PLAX 2D LVIDd:         4.70 cm  Diastology LVIDs:         2.80 cm  LV e' medial:    7.83 cm/s LV PW:         0.80 cm  LV E/e' medial:  11.5 LV IVS:        0.80 cm  LV e' lateral:   8.49 cm/s LVOT diam:     2.10 cm  LV E/e' lateral: 10.6 LV SV:         85 LV SV Index:   44 LVOT Area:     3.46 cm  RIGHT VENTRICLE RV S prime:     11.80 cm/s TAPSE (M-mode): 1.1 cm LEFT ATRIUM           Index       RIGHT ATRIUM           Index LA diam:      3.20 cm 1.65 cm/m  RA Area:     13.30 cm LA Vol (A2C): 52.2 ml 26.98 ml/m RA Volume:   27.80 ml  14.37 ml/m LA Vol (A4C): 37.0 ml 19.12 ml/m  AORTIC VALVE LVOT Vmax:   147.00 cm/s LVOT Vmean:  92.500 cm/s LVOT VTI:    0.246 m  AORTA Ao Root diam: 3.20 cm MITRAL VALVE MV Area (PHT): 5.42 cm    SHUNTS MV Decel Time: 140 msec    Systemic VTI:  0.25 m MV E velocity: 89.70 cm/s  Systemic Diam: 2.10 cm MV A velocity: 88.60 cm/s MV E/A ratio:  1.01 Oswaldo Milian MD Electronically signed by Oswaldo Milian MD Signature Date/Time: 11/11/2020/6:43:20 PM    Final    VAS [place] UPPER EXTREMITY VENOUS DUPLEX  Result Date: 11/16/2020 UPPER VENOUS STUDY  Patient Name:  JASPAL PULTZ  Date of Exam:   11/16/2020 Medical Rec #: [id number]         Accession #:    [id number] Date of Birth: 1945/10/27          Patient Gender: M Patient Age:   75Y Exam Location:  [facility] Procedure:      VAS [place] UPPER EXTREMITY VENOUS DUPLEX Referring Phys: Collins  --------------------------------------------------------------------------------  Indications: Swelling Risk Factors: Trauma PICC line. Limitations: Bandages and line. Comparison Study: No prior studies. Performing Technologist: Oliver Hum RVT  Examination Guidelines: A complete evaluation includes B-mode imaging, spectral Doppler, color Doppler, and power Doppler as needed of all accessible portions of each vessel. Bilateral testing is considered an integral part of a complete examination. Limited examinations  for reoccurring indications may be performed as noted.  Right Findings: +----------+------------+---------+-----------+----------+-------+ RIGHT     CompressiblePhasicitySpontaneousPropertiesSummary +----------+------------+---------+-----------+----------+-------+ IJV           Full       Yes       Yes                      +----------+------------+---------+-----------+----------+-------+ Subclavian    Full       Yes       Yes                      +----------+------------+---------+-----------+----------+-------+ Axillary      None       No        No                Acute  +----------+------------+---------+-----------+----------+-------+ Brachial    Partial      No        No                Acute  +----------+------------+---------+-----------+----------+-------+ Radial        Full                                          +----------+------------+---------+-----------+----------+-------+ Ulnar         Full                                          +----------+------------+---------+-----------+----------+-------+ Cephalic      Full                                          +----------+------------+---------+-----------+----------+-------+ Basilic       Full                                          +----------+------------+---------+-----------+----------+-------+  Left Findings: +----------+------------+---------+-----------+----------+-------+  LEFT      CompressiblePhasicitySpontaneousPropertiesSummary +----------+------------+---------+-----------+----------+-------+ Subclavian    Full       Yes       Yes                      +----------+------------+---------+-----------+----------+-------+  Summary:  Right: No evidence of superficial vein thrombosis in the upper extremity. Findings consistent with acute deep vein thrombosis involving the right axillary vein and right brachial veins.  Left: No evidence of thrombosis in the subclavian.  *See table(s) above for measurements and observations.  Diagnosing physician: Ruta Hinds MD Electronically signed by Ruta Hinds MD on 11/16/2020 at 2:34:45 PM.    Final    DG C-ARM BRONCHOSCOPY  Result Date: 11/17/2020 C-ARM BRONCHOSCOPY: Fluoroscopy was utilized by the requesting physician.  No radiographic interpretation.     Assessment & Plan:   Drug-induced pneumonitis Improving; Continues to have some sob, wheezing and cough. Rarely using SABA. Lungs with scant rales right base; otherwise clear. He is requiring less oxygen.  We will begin to taper prednisone by 32m qweek. Continue Bactrim per ID, if they decide to discontinue he should stay on MWF for PJP prophylaxis. Needs PFTs and CT chest  wo contrast. He has follow-up on June 14th with Dr. Chase Caller.     Recommendations:  - Taper prednisone 35m x 1 week; 45mx 1 week; 3017m 1 week; 58m81m1 week; 10mg17m week  - Continue Bactrim as directed by ID (if they want to stop abx please send us a [place] message, we would recommend continuing MWF dosing while on prednisone >20 mg) - Use albuterol rescue inhaler twice a day for the next week or two and prn every 6 hours for breakthrough shortness of breath  - Continue to use flutter valve and Incentive spirometer 2-3 times a day  Orders: - PFTs first available  - CT chest wo contrast re: fu persistent pneumonia/ drug induced pneumonitis  - ONO on room air   Follow-up: - June  with Dr. RamswBrantley Persons 14th at 11:30am    Acute respiratory failure with hypoxia (HCC) [place] saturation on ambulatory walk test today remained above 95% RA. Continue to recommend he use 1-2L with mdoerate exertion and at night. We will check ONO RA.  Anxiety - Anxiety d/t shortness of breath. Discharged from hospital with short term supply of xanax. He will run out 3 days before he will be able to PCP next week who will manage medication moving forward. He is using appropriately without signs of abuse. PMP reviewed. We will refill Xanax 0.5mg B33mprn #10 tabs  Insomnia - Oral Steroids are causing some insomnia. He take Ambien 5mg pr84mt bedtime. Advised not to combine with other sedation medication or alcohol. Medication is not managed by our office    ElizabeMartyn Ehrich17/2022

## 2020-12-03 ENCOUNTER — Ambulatory Visit: Payer: Medicare Other | Admitting: Specialist

## 2020-12-03 ENCOUNTER — Encounter: Payer: Self-pay | Admitting: Specialist

## 2020-12-03 ENCOUNTER — Other Ambulatory Visit: Payer: Self-pay

## 2020-12-03 ENCOUNTER — Ambulatory Visit (INDEPENDENT_AMBULATORY_CARE_PROVIDER_SITE_OTHER): Payer: Medicare Other

## 2020-12-03 VITALS — BP 82/58 | HR 102 | Ht 70.0 in | Wt 157.0 lb

## 2020-12-03 DIAGNOSIS — G4700 Insomnia, unspecified: Secondary | ICD-10-CM | POA: Diagnosis not present

## 2020-12-03 DIAGNOSIS — R0683 Snoring: Secondary | ICD-10-CM | POA: Diagnosis not present

## 2020-12-03 DIAGNOSIS — Z9889 Other specified postprocedural states: Secondary | ICD-10-CM

## 2020-12-03 DIAGNOSIS — Z7984 Long term (current) use of oral hypoglycemic drugs: Secondary | ICD-10-CM | POA: Diagnosis not present

## 2020-12-03 DIAGNOSIS — E1142 Type 2 diabetes mellitus with diabetic polyneuropathy: Secondary | ICD-10-CM | POA: Diagnosis not present

## 2020-12-03 DIAGNOSIS — M546 Pain in thoracic spine: Secondary | ICD-10-CM

## 2020-12-03 DIAGNOSIS — J9601 Acute respiratory failure with hypoxia: Secondary | ICD-10-CM | POA: Diagnosis not present

## 2020-12-03 DIAGNOSIS — E871 Hypo-osmolality and hyponatremia: Secondary | ICD-10-CM | POA: Diagnosis not present

## 2020-12-03 DIAGNOSIS — Z7982 Long term (current) use of aspirin: Secondary | ICD-10-CM | POA: Diagnosis not present

## 2020-12-03 DIAGNOSIS — E785 Hyperlipidemia, unspecified: Secondary | ICD-10-CM | POA: Diagnosis not present

## 2020-12-03 DIAGNOSIS — I82409 Acute embolism and thrombosis of unspecified deep veins of unspecified lower extremity: Secondary | ICD-10-CM | POA: Diagnosis not present

## 2020-12-03 DIAGNOSIS — G4730 Sleep apnea, unspecified: Secondary | ICD-10-CM | POA: Diagnosis not present

## 2020-12-03 DIAGNOSIS — M5137 Other intervertebral disc degeneration, lumbosacral region: Secondary | ICD-10-CM | POA: Diagnosis not present

## 2020-12-03 DIAGNOSIS — I10 Essential (primary) hypertension: Secondary | ICD-10-CM | POA: Diagnosis not present

## 2020-12-03 MED ORDER — TIZANIDINE HCL 4 MG PO TABS: 6.0000 mg | ORAL_TABLET | Freq: Four times a day (QID) | ORAL | 1 refills | Status: DC | PRN

## 2020-12-03 NOTE — Patient Instructions (Signed)
Avoid frequent bending and stooping  No lifting greater than 10 lbs. May use ice or moist heat for pain. Weight loss is of benefit. Tizanidine is renewed Exercise is important to improve your indurance and does allow people to function better inspite of back pain. A pool walking program when pulmonary gives the okay. The areas of the thoracis spine post removal of hardware suggest that the T5-6 disc is fusing and there is no  Instability.

## 2020-12-03 NOTE — Progress Notes (Signed)
Office Visit Note   Patient: Luis Brown           Date of Birth: August 01, 1945           MRN: [id number] Visit Date: 12/03/2020              Requested by: Mackie Pai, PA-C [address],  [place] [address] PCP: Mackie Pai, PA-C   Assessment & Plan: Visit Diagnoses:  1. Pain in thoracic spine   2. Status post hardware removal     Plan: Avoid frequent bending and stooping  No lifting greater than 10 lbs. May use ice or moist heat for pain. Weight loss is of benefit. Tizanidine is renewed Exercise is important to improve your indurance and does allow people to function better inspite of back pain. A pool walking program when pulmonary gives the okay. The areas of the thoracis spine post removal of hardware suggest that the T5-6 disc is fusing and there is no  Instability.    Follow-Up Instructions: No follow-ups on file.   Orders:  Orders Placed This Encounter  Procedures  . XR Thoracic Spine 2 View   No orders of the defined types were placed in this encounter.     Procedures: No procedures performed   Clinical Data: No additional findings.   Subjective: Chief Complaint  Patient presents with  . 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 He has undergone remove of the upper 3 level of fixation of the thoracic spine due to loosening of hardware about 3 months ago. He has had relief of the upper sharp pain in the thorax but still has pain that is dull and aching between the shoulder blades. No bowel or bladder difficulty. He was hospitalized at the beginning of May and late April with right lobar pneumonia, atypical pneumonia due    Review of Systems  Constitutional: Negative.   HENT: Negative.   Eyes: Negative.   Respiratory: Negative.   Cardiovascular: Negative.   Gastrointestinal: Negative.   Endocrine: Negative.   Genitourinary:  Negative.   Musculoskeletal: Negative.   Skin: Negative.   Allergic/Immunologic: Negative.   Neurological: Negative.   Hematological: Negative.   Psychiatric/Behavioral: Negative.      Objective: Vital Signs: BP (!) 82/58 (BP Location: Left Arm, Patient Position: Sitting)   Pulse (!) 102   Ht 5\' 10"  (1.778 m)   Wt 157 lb (71.2 kg)   BMI 22.53 kg/m   Physical Exam Constitutional:      Appearance: He is well-developed.  HENT:     Head: Normocephalic and atraumatic.  Eyes:     Pupils: Pupils are equal, round, and reactive to light.  Pulmonary:     Effort: Pulmonary effort is normal.     Breath sounds: Normal breath sounds.  Abdominal:     General: Bowel sounds are normal.     Palpations: Abdomen is soft.  Musculoskeletal:        General: Normal range of motion.     Cervical back: Normal range of motion and neck supple.  Skin:    General: Skin is warm and dry.  Neurological:     Mental Status: He is alert and oriented to person, place, and time.  Psychiatric:        Behavior: Behavior normal.        Thought Content: Thought content normal.  Judgment: Judgment normal.     Back Exam   Tenderness  The patient is experiencing tenderness in the lumbar.  Muscle Strength  Right Quadriceps:  5/5  Left Quadriceps:  5/5  Right Hamstrings:  5/5  Left Hamstrings:  5/5       Specialty Comments:  No specialty comments available.  Imaging: No results found.   PMFS History: Patient Active Problem List   Diagnosis Date Noted  . Anxiety 12/01/2020  . SOB (shortness of breath)   . DVT (deep venous thrombosis) ([place]) 11/16/2020  . Acute respiratory failure with hypoxia ([place]) 11/14/2020  . Fluid collection at surgical site   . Wound infection   . Sepsis ([place]) 11/11/2020  . Drug-induced pneumonitis 11/10/2020  . S/P bronchoscopy 09/14/2020  . Hyperglycemia 03/22/2020  . Gastric ulcer 01/27/2020  . H/O colonoscopy with polypectomy 01/27/2020  .  Protein-calorie malnutrition, severe 01/09/2020  . Weight loss 01/07/2020  . Abdominal pain 12/30/2019  . Nausea and vomiting 12/30/2019  . Weight loss, abnormal 12/30/2019  . Change in bowel habits 12/30/2019  . 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 Medication monitoring encounter 08/26/2019  . Peripheral neuropathy 08/07/2019  . Alcohol abuse 05/09/2019  . 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[place]) 04/04/2019  . Fusion of spine of thoracolumbar region 03/12/2019  . Status post thoracic spinal fusion 02/12/2019  . 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 Thoracic spinal stenosis 04/17/2017  . Painful orthopaedic hardware ([place]) 04/17/2017  . 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 ([place]) 04/17/2017  . Low back pain radiating to both legs 01/16/2017  . Esophageal reflux 02/10/2015  . Medicare annual wellness visit, subsequent 02/10/2015  . Personal history of colonic polyps 10/27/2012  . 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  . BPH (benign prostatic hyperplasia) 04/23/2012  . ED (erectile dysfunction) 04/23/2012  . Allergic state 10/25/2011  . Anxiety and depression 10/25/2011  . Asthma   . Diabetes mellitus without complication ([place])   . Hyperlipidemia   . Overweight   . Insomnia   . Fatigue   . Preventative health care   . DDD (degenerative disc disease), lumbosacral   . Thoracic degenerative disc disease   . HTN (hypertension)    Past Medical History:  Diagnosis Date  . Acute pharyngitis 10/21/2013  . Allergy    grass and pollen  . Anxiety and depression 10/25/2011  . BPH (benign prostatic hyperplasia) 04/23/2012  . Chicken pox as a child  . DDD (degenerative disc disease)    cervical responds to steroid injections and low back required surgery  . DDD (degenerative disc disease), lumbosacral   . Diabetes mellitus    pre  . Dyspnea   . ED (erectile dysfunction) 04/23/2012  . Elevated BP   .  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 Esophageal reflux 02/10/2015  . Fatigue   . HTN (hypertension)   . Hyperglycemia    preDM   . Hyperlipidemia   . Insomnia   . Low back pain radiating to both legs 01/16/2017  . Measles as a child  . Overweight(278.02)   . Peripheral neuropathy 08/07/2019  . Personal history of colonic polyps 10/27/2012   Follows with [facility]  . Pneumonia    " walking"  . Preventative health care   . 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  . Wears glasses     Family History  Problem Relation Age of Onset  . Hypertension Mother   . Diabetes Mother  type 2  . Cancer Mother 66       breast in remission  . Emphysema Father        smoker  . COPD Father        smoker  . Stroke Father 62       mini  . Heart disease Father   . Hypertension Sister   . Hyperlipidemia Sister   . Scoliosis Sister   . Osteoporosis Sister   . Hypertension Maternal Grandmother   . Scoliosis Maternal Grandmother   . Heart disease Maternal Grandfather   . Heart disease Paternal Grandfather        smoker  . Heart disease Daughter     Past Surgical History:  Procedure Laterality Date  . BACK SURGERY  2012 and 1994   Dr Margret Chance, screws and cage in low back  . BIOPSY  01/09/2020   Procedure: BIOPSY;  Surgeon: Otis Brace, MD;  Location: [facility];  Service: Gastroenterology;;  . BRONCHIAL BIOPSY  11/17/2020   Procedure: BRONCHIAL BIOPSIES;  Surgeon: Laurin Coder, MD;  Location: [facility];  Service: Endoscopy;;  . BRONCHIAL BRUSHINGS  11/17/2020   Procedure: BRONCHIAL BRUSHINGS;  Surgeon: Laurin Coder, MD;  Location: [facility];  Service: Endoscopy;;  . BRONCHIAL WASHINGS  11/17/2020   Procedure: BRONCHIAL WASHINGS;  Surgeon: Laurin Coder, MD;  Location: [facility];  Service: Endoscopy;;  . COLONOSCOPY WITH PROPOFOL N/A 01/09/2020   Procedure: COLONOSCOPY WITH PROPOFOL;  Surgeon: Otis Brace, MD;  Location: [facility];  Service:  Gastroenterology;  Laterality: N/A;  . ESOPHAGOGASTRODUODENOSCOPY (EGD) WITH PROPOFOL N/A 01/09/2020   Procedure: ESOPHAGOGASTRODUODENOSCOPY (EGD) WITH PROPOFOL;  Surgeon: Otis Brace, MD;  Location: [facility];  Service: Gastroenterology;  Laterality: N/A;  . EYE SURGERY Bilateral    2016  . HARDWARE REVISION  03/12/2019   REVISION OF HARDWARE THORACIC TEN-THORACIC ELEVEN WITH APPLICATION OF ADDITIONAL RODS AND ROD SLEEVES THORACIC EIGHT-THORACIC TWELVE 12-LUMBAR ONE (N/A )  . HEMOSTASIS CONTROL  11/17/2020   Procedure: HEMOSTASIS CONTROL;  Surgeon: Laurin Coder, MD;  Location: [facility];  Service: Endoscopy;;  . IR US GUIDE BX ASP/DRAIN  11/13/2020  . LAMINECTOMY  02/12/2019   DECOMPRESSIVE LAMINECTOMY THORACIC NINE-THORACIC TEN AND THORACIC TEN-THORACIC ELEVEN, EXTENSION OF THORACOLUMBAR FUSION FROM THORACIC TEN TO THORACIC FIVE, LOCAL BONE GRAFT, ALLOGRAFT AND VIVIGEN (N/A)  . POLYPECTOMY  01/09/2020   Procedure: POLYPECTOMY;  Surgeon: Otis Brace, MD;  Location: [facility];  Service: Gastroenterology;;  . REMOVAL OF RODS AND PEDICLE SCREWS T5 AND T6, EXPLORATION OF FUSION, BIOPSY TRANSPEDICULAR T5 AND T6 (N/A )  09/14/2020  . SPINAL FUSION N/A 02/12/2019   Procedure: DECOMPRESSIVE LAMINECTOMY THORACIC NINE-THORACIC TEN AND THORACIC TEN-THORACIC ELEVEN, EXTENSION OF THORACOLUMBAR FUSION FROM THORACIC TEN TO THORACIC FIVE, LOCAL BONE GRAFT, ALLOGRAFT AND VIVIGEN;  Surgeon: Jessy Oto, MD;  Location: [facility];  Service: Orthopedics;  Laterality: N/A;  DECOMPRESSIVE LAMINECTOMY THORACIC NINE-THORACIC TEN AND THORACIC TEN-THORACIC ELEVEN, EXTENSION OF TH  . SPINAL FUSION N/A 03/12/2019   Procedure: REVISION OF HARDWARE THORACIC TEN-THORACIC ELEVEN WITH APPLICATION OF ADDITIONAL RODS AND ROD SLEEVES THORACIC EIGHT-THORACIC TWELVE 12-LUMBAR ONE;  Surgeon: Jessy Oto, MD;  Location: [facility];  Service: Orthopedics;  Laterality: N/A;  . TONSILLECTOMY     as child  . torn rotator  cuff  2010   right  . VIDEO BRONCHOSCOPY N/A 11/17/2020   Procedure: VIDEO BRONCHOSCOPY WITH FLUORO;  Surgeon: Laurin Coder, MD;  Location: [facility];  Service: Endoscopy;  Laterality:  N/A;   Social History   Occupational History  . Not on file  Tobacco Use  . Smoking status: Former Smoker    Packs/day: 1.00    Years: 20.00    Pack years: 20.00    Types: Cigarettes    Quit date: 07/18/1989    Years since quitting: 31.4  . Smokeless tobacco: Never Used  . Tobacco comment: off and on smoking for the 20 yrs  Vaping Use  . Vaping Use: Never used  Substance and Sexual Activity  . Alcohol use: Not Currently    Comment: drink or two a day- mixed drink  . Drug use: No  . Sexual activity: Yes    Comment: lives with wife, still working, no dietary restrictions, continues to exercise intermittently

## 2020-12-04 ENCOUNTER — Ambulatory Visit: Payer: Medicare Other | Admitting: Infectious Disease

## 2020-12-04 ENCOUNTER — Other Ambulatory Visit (HOSPITAL_COMMUNITY)
Admission: RE | Admit: 2020-12-04 | Discharge: 2020-12-04 | Disposition: A | Discharge status: Home / Self Care | Payer: Medicare Other | Source: Ambulatory Visit | Attending: Primary Care | Admitting: Primary Care

## 2020-12-04 VITALS — BP 132/80 | HR 120 | Temp 98.2°F | Wt 156.0 lb

## 2020-12-04 DIAGNOSIS — T148XXA Other injury of unspecified body region, initial encounter: Secondary | ICD-10-CM

## 2020-12-04 DIAGNOSIS — G062 Extradural and subdural abscess, unspecified: Secondary | ICD-10-CM | POA: Diagnosis not present

## 2020-12-04 DIAGNOSIS — J189 Pneumonia, unspecified organism: Secondary | ICD-10-CM | POA: Diagnosis not present

## 2020-12-04 DIAGNOSIS — Z9889 Other specified postprocedural states: Secondary | ICD-10-CM

## 2020-12-04 DIAGNOSIS — M4624 Osteomyelitis of vertebra, thoracic region: Secondary | ICD-10-CM | POA: Diagnosis not present

## 2020-12-04 DIAGNOSIS — T50905A Adverse effect of unspecified drugs, medicaments and biological substances, initial encounter: Secondary | ICD-10-CM

## 2020-12-04 DIAGNOSIS — L089 Local infection of the skin and subcutaneous tissue, unspecified: Secondary | ICD-10-CM

## 2020-12-04 DIAGNOSIS — Z20822 Contact with and (suspected) exposure to covid-19: Secondary | ICD-10-CM | POA: Insufficient documentation

## 2020-12-04 DIAGNOSIS — Z01812 Encounter for preprocedural laboratory examination: Secondary | ICD-10-CM | POA: Insufficient documentation

## 2020-12-04 LAB — SARS CORONAVIRUS 2 (TAT 6-24 HRS): SARS Coronavirus 2: NEGATIVE

## 2020-12-04 NOTE — Progress Notes (Signed)
Subjective:    [name], male    DOB: July 15, 1946, 74 y.o.   MRN: [id number]  Chief complaint: Still with some dyspnea.  Back pain is improved  HPI y.o. 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  He then had loosening of screws and had them removed by Dr. Louanne Skye due to continuous pain at that site.  There is no purulence but culture did yield a Staph epidermidis.  He was placed on oral Bactrim and then seen by my partner Dr. Gale Journey who placed him on IV daptomycin with stop date due in a few days.  He was then admitted with extensive right-sided pneumonia.  He failed to improve and actually worsened in terms of his hypoxia despite vancomycin and cefepime.  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  He underwent bronchoscopy with bronchoalveolar lavage.  Cultures grew no organisms.  The cell count and differential could not be done apparently he was started on corticosteroids and this did result in improvement in his oxygenation although he still is requiring oxygen currently.  He tells me today that his pain in the middle of his back associate with his hardware infection is now better than it was before is more of a dull aching pain worse before he would develop a sharper pain that he sometimes experiences but he is able to prevent it getting that bad now where he was not able to before.  We have him now on Bactrim 1 double strength tablet twice daily.  This serves to treat his spine infection and also for PCP prophylaxis in the context of steroids which are being weaned by [facility].  He has seen Dr. Louanne Skye as well for followup.      Past Medical History:  Diagnosis Date  . Acute pharyngitis 10/21/2013  . Allergy    grass and  pollen  . Anxiety and depression 10/25/2011  . BPH (benign prostatic hyperplasia) 04/23/2012  . Chicken pox as a child  . DDD (degenerative disc disease)    cervical responds to steroid injections and low back required surgery  . DDD (degenerative disc disease), lumbosacral   . Diabetes mellitus    pre  . Dyspnea   . ED (erectile dysfunction) 04/23/2012  . Elevated BP   . 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 Esophageal reflux 02/10/2015  . Fatigue   . HTN (hypertension)   . Hyperglycemia    preDM   . Hyperlipidemia   . Insomnia   . Low back pain radiating to both legs 01/16/2017  . Measles as a child  . Overweight(278.02)   . Peripheral neuropathy 08/07/2019  . Personal history of colonic polyps 10/27/2012   Follows with [facility]  . Pneumonia    " walking"  . Preventative health care   . 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  . Wears glasses     Past Surgical History:  Procedure Laterality Date  . BACK SURGERY  2012 and 1994   Dr Margret Chance, screws and cage in low back  . BIOPSY  01/09/2020   Procedure: BIOPSY;  Surgeon: Otis Brace, MD;  Location: [facility];  Service: Gastroenterology;;  . BRONCHIAL BIOPSY  11/17/2020   Procedure: BRONCHIAL BIOPSIES;  Surgeon: Laurin Coder, MD;  Location: [facility];  Service: Endoscopy;;  . BRONCHIAL BRUSHINGS  11/17/2020  Procedure: BRONCHIAL BRUSHINGS;  Surgeon: Laurin Coder, MD;  Location: [facility];  Service: Endoscopy;;  . BRONCHIAL WASHINGS  11/17/2020   Procedure: BRONCHIAL WASHINGS;  Surgeon: Laurin Coder, MD;  Location: [facility];  Service: Endoscopy;;  . COLONOSCOPY WITH PROPOFOL N/A 01/09/2020   Procedure: COLONOSCOPY WITH PROPOFOL;  Surgeon: Otis Brace, MD;  Location: [facility];  Service: Gastroenterology;  Laterality: N/A;  . ESOPHAGOGASTRODUODENOSCOPY (EGD) WITH PROPOFOL N/A 01/09/2020   Procedure: ESOPHAGOGASTRODUODENOSCOPY (EGD) WITH PROPOFOL;  Surgeon: Otis Brace, MD;  Location: [facility];  Service: Gastroenterology;  Laterality: N/A;  . EYE SURGERY Bilateral    2016  . HARDWARE REVISION  03/12/2019   REVISION OF HARDWARE THORACIC TEN-THORACIC ELEVEN WITH APPLICATION OF ADDITIONAL RODS AND ROD SLEEVES THORACIC EIGHT-THORACIC TWELVE 12-LUMBAR ONE (N/A )  . HEMOSTASIS CONTROL  11/17/2020   Procedure: HEMOSTASIS CONTROL;  Surgeon: Laurin Coder, MD;  Location: [facility];  Service: Endoscopy;;  . IR US GUIDE BX ASP/DRAIN  11/13/2020  . LAMINECTOMY  02/12/2019   DECOMPRESSIVE LAMINECTOMY THORACIC NINE-THORACIC TEN AND THORACIC TEN-THORACIC ELEVEN, EXTENSION OF THORACOLUMBAR FUSION FROM THORACIC TEN TO THORACIC FIVE, LOCAL BONE GRAFT, ALLOGRAFT AND VIVIGEN (N/A)  . POLYPECTOMY  01/09/2020   Procedure: POLYPECTOMY;  Surgeon: Otis Brace, MD;  Location: [facility];  Service: Gastroenterology;;  . REMOVAL OF RODS AND PEDICLE SCREWS T5 AND T6, EXPLORATION OF FUSION, BIOPSY TRANSPEDICULAR T5 AND T6 (N/A )  09/14/2020  . SPINAL FUSION N/A 02/12/2019   Procedure: DECOMPRESSIVE LAMINECTOMY THORACIC NINE-THORACIC TEN AND THORACIC TEN-THORACIC ELEVEN, EXTENSION OF THORACOLUMBAR FUSION FROM THORACIC TEN TO THORACIC FIVE, LOCAL BONE GRAFT, ALLOGRAFT AND VIVIGEN;  Surgeon: Jessy Oto, MD;  Location: [place];  Service: Orthopedics;  Laterality: N/A;  DECOMPRESSIVE LAMINECTOMY THORACIC NINE-THORACIC TEN AND THORACIC TEN-THORACIC ELEVEN, EXTENSION OF TH  . SPINAL FUSION N/A 03/12/2019   Procedure: REVISION OF HARDWARE THORACIC TEN-THORACIC ELEVEN WITH APPLICATION OF ADDITIONAL RODS AND ROD SLEEVES THORACIC EIGHT-THORACIC TWELVE 12-LUMBAR ONE;  Surgeon: Jessy Oto, MD;  Location: [place];  Service: Orthopedics;  Laterality: N/A;  . TONSILLECTOMY     as child  . torn rotator cuff  2010   right  . VIDEO BRONCHOSCOPY N/A 11/17/2020   Procedure: VIDEO BRONCHOSCOPY WITH FLUORO;  Surgeon: Laurin Coder, MD;  Location: [facility];  Service: Endoscopy;  Laterality: N/A;     Family History  Problem Relation Age of Onset  . Hypertension Mother   . Diabetes Mother        type 2  . Cancer Mother 39       breast in remission  . Emphysema Father        smoker  . COPD Father        smoker  . Stroke Father 68       mini  . Heart disease Father   . Hypertension Sister   . Hyperlipidemia Sister   . Scoliosis Sister   . Osteoporosis Sister   . Hypertension Maternal Grandmother   . Scoliosis Maternal Grandmother   . Heart disease Maternal Grandfather   . Heart disease Paternal Grandfather        smoker  . Heart disease Daughter       Social History   Socioeconomic History  . Marital status: Married    Spouse name: Not on file  . Number of children: Not on file  . Years of education: Not on file  . Highest education level: Not on file  Occupational History  .  Not on file  Tobacco Use  . Smoking status: Former Smoker    Packs/day: 1.00    Years: 20.00    Pack years: 20.00    Types: Cigarettes    Quit date: 07/18/1989    Years since quitting: 31.4  . Smokeless tobacco: Never Used  . Tobacco comment: off and on smoking for the 20 yrs  Vaping Use  . Vaping Use: Never used  Substance and Sexual Activity  . Alcohol use: Not Currently    Comment: drink or two a day- mixed drink  . Drug use: No  . Sexual activity: Yes    Comment: lives with wife, still working, no dietary restrictions, continues to exercise intermittently  Other Topics Concern  . Not on file  Social History Narrative  . Not on file   Social Determinants of Health   Financial Resource Strain: Not on file  Food Insecurity: Not on file  Transportation Needs: Not on file  Physical Activity: Not on file  Stress: Not on file  Social Connections: Not on file    No Known Allergies   Current Outpatient Medications:  .  ALPRAZolam (XANAX) 0.5 MG tablet, Take 1 tablet (0.5 mg total) by mouth 2 (two) times daily as needed for up to 10 days for anxiety., Disp: 10 tablet, Rfl:  0 .  atorvastatin (LIPITOR) 10 MG tablet, TAKE 1 TABLET BY MOUTH EVERY DAY, Disp: 30 tablet, Rfl: 0 .  b complex vitamins capsule, Take 1 capsule by mouth daily., Disp: , Rfl:  .  BAYER ASPIRIN EC LOW DOSE 81 MG EC tablet, Take 81 mg by mouth every morning., Disp: , Rfl:  .  busPIRone (BUSPAR) 7.5 MG tablet, 1-2 TAB BY MOUTH 3 TIMES A DAY FOR ANXIETY (Patient taking differently: Take 7.5 mg by mouth 2 (two) times daily.), Disp: 540 tablet, Rfl: 1 .  cetirizine (ZYRTEC) 10 MG tablet, Take 10 mg by mouth daily. In the morning, Disp: , Rfl:  .  dextromethorphan-guaiFENesin (MUCINEX DM) 30-600 MG 12hr tablet, Take 1 tablet by mouth 2 (two) times daily., Disp: 40 tablet, Rfl: 0 .  famotidine (PEPCID) 40 MG tablet, TAKE 1 TABLET BY MOUTH EVERYDAY AT BEDTIME (Patient taking differently: Take 40 mg by mouth at bedtime.), Disp: 90 tablet, Rfl: 1 .  ferrous fumarate (FERRETTS) 325 (106 Fe) MG TABS tablet, Take 1 tablet (106 mg of iron total) by mouth daily., Disp: 90 tablet, Rfl: 3 .  glucose blood (CONTOUR NEXT TEST) test strip, Check blood sugar once daily, Disp: 100 each, Rfl: 12 .  JANUVIA 50 MG tablet, TAKE 1 TABLET BY MOUTH EVERY DAY, Disp: 30 tablet, Rfl: 3 .  metFORMIN (GLUCOPHAGE) 1000 MG tablet, Take 1 tablet (1,000 mg total) by mouth 2 (two) times daily with a meal., Disp: 180 tablet, Rfl: 0 .  olmesartan (BENICAR) 5 MG tablet, Take 1 tablet (5 mg total) by mouth daily., Disp: 30 tablet, Rfl: 2 .  pantoprazole (PROTONIX) 40 MG tablet, TAKE 1 TABLET BY MOUTH TWICE A DAY BEFORE A MEAL (Patient taking differently: Take 40 mg by mouth 2 (two) times daily before a meal.), Disp: 180 tablet, Rfl: 1 .  predniSONE (DELTASONE) 10 MG tablet, Take 5 tablets (50 mg total) by mouth daily with breakfast for 7 days, THEN 4 tablets (40 mg total) daily with breakfast for 7 days, THEN 3 tablets (30 mg total) daily with breakfast for 7 days, THEN 2 tablets (20 mg total) daily with breakfast for 7 days, THEN  1 tablet (10  mg total) daily with breakfast for 7 days., Disp: 105 tablet, Rfl: 0 .  pregabalin (LYRICA) 75 MG capsule, TAKE 1 CAPSULE BY MOUTH TWICE A DAY (Patient taking differently: Take 75 mg by mouth 2 (two) times daily.), Disp: 60 capsule, Rfl: 3 .  PROVENTIL HFA 108 (90 Base) MCG/ACT inhaler, TAKE 2 PUFFS BY MOUTH EVERY 6 HOURS AS NEEDED FOR WHEEZE OR SHORTNESS OF BREATH, Disp: 6.7 each, Rfl: 5 .  Rivaroxaban (XARELTO) 15 MG TABS tablet, Take 1 tablet (15 mg total) by mouth 2 (two) times daily with a meal for 16 days., Disp: 32 tablet, Rfl: 0 .  [START ON 12/10/2020] rivaroxaban (XARELTO) 20 MG TABS tablet, Take 1 tablet (20 mg total) by mouth daily with supper., Disp: 30 tablet, Rfl: 0 .  sodium chloride (OCEAN) 0.65 % SOLN nasal spray, Place 1 spray into both nostrils as needed for congestion., Disp: 20 mL, Rfl: 0 .  sucralfate (CARAFATE) 1 g tablet, Take 1 tablet (1 g total) by mouth 4 (four) times daily -  with meals and at bedtime., Disp: 120 tablet, Rfl: 1 .  sulfamethoxazole-trimethoprim (BACTRIM DS) 800-160 MG tablet, Take 1 tablet by mouth every 12 (twelve) hours., Disp: 60 tablet, Rfl: 0 .  tamsulosin (FLOMAX) 0.4 MG CAPS capsule, Take 2 capsules (0.8 mg total) by mouth daily., Disp: 180 capsule, Rfl: 3 .  testosterone cypionate (DEPOTESTOSTERONE CYPIONATE) 200 MG/ML injection, INJECT 1 ML (200 MG TOTAL) INTO THE MUSCLE EVERY 14 (FOURTEEN) DAYS., Disp: 2 mL, Rfl: 0 .  tiZANidine (ZANAFLEX) 4 MG tablet, Take 1.5 tablets (6 mg total) by mouth every 6 (six) hours as needed for muscle spasms., Disp: 540 tablet, Rfl: 1 .  venlafaxine XR (EFFEXOR-XR) 150 MG 24 hr capsule, TAKE 1 CAPSULE (150 MG TOTAL) BY MOUTH DAILY WITH BREAKFAST., Disp: 90 capsule, Rfl: 1 .  zolpidem (AMBIEN) 5 MG tablet, Take 1 tablet (5 mg total) by mouth at bedtime as needed for sleep., Disp: 30 tablet, Rfl: 0 .  HYDROcodone-acetaminophen (NORCO) 7.5-325 MG tablet, Take 1 tablet by mouth every 4 (four) hours as needed for up to 7 days  for moderate pain ((score 4 to 6))., Disp: 40 tablet, Rfl: 0  Current Facility-Administered Medications:  .  testosterone cypionate (DEPOTESTOSTERONE CYPIONATE) injection 200 mg, 200 mg, Intramuscular, Q14 Days, Saguier, Edward, PA-C, 200 mg at 09/11/20 5974   Review of Systems  Constitutional: Negative for activity change, appetite change, chills, diaphoresis, fatigue, fever and unexpected weight change.  HENT: Negative for congestion, rhinorrhea, sinus pressure, sneezing, sore throat and trouble swallowing.   Eyes: Negative for photophobia and visual disturbance.  Respiratory: Negative for cough, chest tightness, shortness of breath, wheezing and stridor.   Cardiovascular: Negative for chest pain, palpitations and leg swelling.  Gastrointestinal: Negative for abdominal distention, abdominal pain, anal bleeding, blood in stool, constipation, diarrhea, nausea and vomiting.  Genitourinary: Negative for difficulty urinating, dysuria, flank pain and hematuria.  Musculoskeletal: Negative for arthralgias, back pain, gait problem, joint swelling and myalgias.  Skin: Negative for color change, pallor, rash and wound.  Neurological: Negative for dizziness, tremors, weakness and light-headedness.  Hematological: Negative for adenopathy. Does not bruise/bleed easily.  Psychiatric/Behavioral: Negative for agitation, behavioral problems, confusion, decreased concentration, dysphoric mood and sleep disturbance.       Objective:   Physical Exam Constitutional:      Appearance: He is well-developed.  HENT:     Head: Normocephalic and atraumatic.  Eyes:     Conjunctiva/sclera: Conjunctivae  normal.  Cardiovascular:     Rate and Rhythm: Normal rate and regular rhythm.     Heart sounds: No murmur heard.   Pulmonary:     Effort: Pulmonary effort is normal. No respiratory distress.     Breath sounds: Rhonchi present. No wheezing.  Abdominal:     General: Bowel sounds are normal. There is no  distension.     Palpations: Abdomen is soft.  Musculoskeletal:        General: No tenderness. Normal range of motion.     Cervical back: Normal range of motion and neck supple.  Skin:    General: Skin is warm and dry.     Coloration: Skin is not pale.     Findings: No erythema or rash.  Neurological:     General: No focal deficit present.     Mental Status: He is alert and oriented to person, place, and time.  Psychiatric:        Mood and Affect: Mood normal.        Behavior: Behavior normal.        Thought Content: Thought content normal.        Judgment: Judgment normal.    On supplemental O2       Assessment & Plan:  With associated vertebral infection status post removal screws: Continue Bactrim double strength tablet twice daily check BMP with GFR and CBC with differential today.  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  He is being followed closely by pulmonary.  I spent greater than 40 minutes with the patient including greater than 50% of time in face to face counsel of the patient in review of his radiographs reading them, reviewing her biological laboratory data and in coordination of his care.

## 2020-12-05 LAB — CBC WITH DIFFERENTIAL/PLATELET
Absolute Monocytes: 578 cells/uL (ref 200–950)
Basophils Absolute: 24 cells/uL (ref 0–200)
Basophils Relative: 0.2 %
Eosinophils Absolute: 12 cells/uL — ABNORMAL LOW (ref 15–500)
Eosinophils Relative: 0.1 %
HCT: 50.9 % — ABNORMAL HIGH (ref 38.5–50.0)
Hemoglobin: 16.4 g/dL (ref 13.2–17.1)
Lymphs Abs: 425 cells/uL — ABNORMAL LOW (ref 850–3900)
MCH: 29 pg (ref 27.0–33.0)
MCHC: 32.2 g/dL (ref 32.0–36.0)
MCV: 90.1 fL (ref 80.0–100.0)
MPV: 9 fL (ref 7.5–12.5)
Monocytes Relative: 4.9 %
Neutro Abs: 10762 cells/uL — ABNORMAL HIGH (ref 1500–7800)
Neutrophils Relative %: 91.2 %
Platelets: 346 10*3/uL (ref 140–400)
RBC: 5.65 10*6/uL (ref 4.20–5.80)
RDW: 14.7 % (ref 11.0–15.0)
Total Lymphocyte: 3.6 %
WBC: 11.8 10*3/uL — ABNORMAL HIGH (ref 3.8–10.8)

## 2020-12-05 LAB — C-REACTIVE PROTEIN: CRP: 0.8 mg/L (ref <8.0)

## 2020-12-05 LAB — BASIC METABOLIC PANEL WITH GFR
BUN: 23 mg/dL (ref 7–25)
CO2: 31 mmol/L (ref 20–32)
Calcium: 10.2 mg/dL (ref 8.6–10.3)
Chloride: 81 mmol/L — ABNORMAL LOW (ref 98–110)
Creat: 0.84 mg/dL (ref 0.70–1.18)
GFR, Est African American: 98 mL/min/{1.73_m2} (ref >=60)
GFR, Est Non African American: 84 mL/min/{1.73_m2} (ref >=60)
Glucose, Bld: 245 mg/dL — ABNORMAL HIGH (ref 65–99)
Potassium: 5.8 mmol/L — ABNORMAL HIGH (ref 3.5–5.3)
Sodium: 121 mmol/L — ABNORMAL LOW (ref 135–146)

## 2020-12-05 LAB — SEDIMENTATION RATE: Sed Rate: 2 mm/h (ref 0–20)

## 2020-12-07 ENCOUNTER — Other Ambulatory Visit: Payer: Self-pay | Admitting: Infectious Disease

## 2020-12-07 ENCOUNTER — Ambulatory Visit (INDEPENDENT_AMBULATORY_CARE_PROVIDER_SITE_OTHER): Payer: Medicare Other | Admitting: Medical

## 2020-12-07 ENCOUNTER — Encounter: Payer: Self-pay | Admitting: Medical

## 2020-12-07 ENCOUNTER — Telehealth: Payer: Self-pay

## 2020-12-07 ENCOUNTER — Other Ambulatory Visit: Payer: Self-pay

## 2020-12-07 VITALS — BP 128/70 | HR 100 | Resp 22 | Ht 70.0 in | Wt 157.0 lb

## 2020-12-07 DIAGNOSIS — M549 Dorsalgia, unspecified: Secondary | ICD-10-CM | POA: Diagnosis not present

## 2020-12-07 DIAGNOSIS — J8281 Chronic eosinophilic pneumonia: Secondary | ICD-10-CM

## 2020-12-07 DIAGNOSIS — E119 Type 2 diabetes mellitus without complications: Secondary | ICD-10-CM | POA: Diagnosis not present

## 2020-12-07 DIAGNOSIS — R0600 Dyspnea, unspecified: Secondary | ICD-10-CM

## 2020-12-07 DIAGNOSIS — B49 Unspecified mycosis: Secondary | ICD-10-CM

## 2020-12-07 DIAGNOSIS — I10 Essential (primary) hypertension: Secondary | ICD-10-CM | POA: Diagnosis not present

## 2020-12-07 DIAGNOSIS — E1142 Type 2 diabetes mellitus with diabetic polyneuropathy: Secondary | ICD-10-CM | POA: Diagnosis not present

## 2020-12-07 DIAGNOSIS — E871 Hypo-osmolality and hyponatremia: Secondary | ICD-10-CM

## 2020-12-07 DIAGNOSIS — F419 Anxiety disorder, unspecified: Secondary | ICD-10-CM

## 2020-12-07 DIAGNOSIS — Z7984 Long term (current) use of oral hypoglycemic drugs: Secondary | ICD-10-CM | POA: Diagnosis not present

## 2020-12-07 DIAGNOSIS — M4624 Osteomyelitis of vertebra, thoracic region: Secondary | ICD-10-CM

## 2020-12-07 DIAGNOSIS — Z7982 Long term (current) use of aspirin: Secondary | ICD-10-CM | POA: Diagnosis not present

## 2020-12-07 DIAGNOSIS — G8929 Other chronic pain: Secondary | ICD-10-CM

## 2020-12-07 DIAGNOSIS — R7989 Other specified abnormal findings of blood chemistry: Secondary | ICD-10-CM | POA: Diagnosis not present

## 2020-12-07 DIAGNOSIS — E785 Hyperlipidemia, unspecified: Secondary | ICD-10-CM | POA: Diagnosis not present

## 2020-12-07 DIAGNOSIS — E875 Hyperkalemia: Secondary | ICD-10-CM | POA: Diagnosis not present

## 2020-12-07 DIAGNOSIS — M5137 Other intervertebral disc degeneration, lumbosacral region: Secondary | ICD-10-CM | POA: Diagnosis not present

## 2020-12-07 DIAGNOSIS — G4700 Insomnia, unspecified: Secondary | ICD-10-CM | POA: Diagnosis not present

## 2020-12-07 DIAGNOSIS — J9601 Acute respiratory failure with hypoxia: Secondary | ICD-10-CM | POA: Diagnosis not present

## 2020-12-07 DIAGNOSIS — I82409 Acute embolism and thrombosis of unspecified deep veins of unspecified lower extremity: Secondary | ICD-10-CM | POA: Diagnosis not present

## 2020-12-07 MED ORDER — ALPRAZOLAM 0.5 MG PO TABS: 0.5000 mg | ORAL_TABLET | Freq: Two times a day (BID) | ORAL | 0 refills | Status: DC | PRN

## 2020-12-07 MED ORDER — NYSTATIN 100000 UNIT/GM EX CREA: 1.0000 "application " | TOPICAL_CREAM | Freq: Two times a day (BID) | CUTANEOUS | 1 refills | Status: DC

## 2020-12-07 MED ORDER — TESTOSTERONE CYPIONATE 200 MG/ML IM SOLN
200.0000 mg | Freq: Once | INTRAMUSCULAR | Status: AC
  Administered 2020-12-07: 200 mg via INTRAMUSCULAR

## 2020-12-07 NOTE — Patient Instructions (Addendum)
History of eosinophilic pneumonia..  Presently on high-dose prednisone and will taper over the upcoming weeks.  Follow-up with pulmonologist tomorrow.  Continue daily use of oxygen.  Signed [place] oxygen form.  History of diabetes with A1c 6.9 weeks ago.  However recently since using high dose prednisone sugars have trended upward.  However since discharge from the hospital most days sugar was less than 200.  However earlier today sugar was 234.  Continue metformin 1000 mg twice daily and Januvia 50 mg daily.  Eat low sugar diet.  Check blood sugars daily.  If sugars are not trending above 200 let me know.  In that event would prescribe mealtime insulin and give sliding scale instructions.  Chronic back pain.  Continue Norco.  However do try to taper off as you have equipment removed now.  Glad to hear pain has decreased some.  Follow infectious disease recommendations regarding possible continuation of antibiotics.   Time spent with patient today was   minutes which consisted of chart revdiew, discussing diagnosis, work up treatment and documentation. History of low T.  Testosterone injection given today.  History of anxiety and a bit worse recently with reported dyspnea and high-dose prednisone use.  Refilled Xanax today.  Fungal infection recently.  Likely related to sugar levels and prednisone use.  Prescribed nystatin cream.  If rash persist on scrotum then consider oral antifungal.  Hypokalemia and low sodium.  Recent Bactrim and ARB discontinued.  Patient has follow-up with ID with planned repeat labs in 2 days.  I recommend presently hydrate well with [employer] water or sugar-free Gatorade.  Consult foods.  Presently advised not to over hydrate with plain water.  Follow-up in 2 weeks or as needed.

## 2020-12-07 NOTE — Progress Notes (Signed)
Subjective:    [name], male    DOB: [birth year], 75 y.o.   MRN: [id number]  HPI  Pt in for follow up.  Pt admits he was not using 02 some during the day but not very day.   Pt is having PT. Did PT yesterday and had to use more 02 today.  Pt was diagnosed with eosinophic pneumonia. Pt states daptosmycin  IV med thought to be potenital cause.  Pt states bactrim DS on hold presenlty per infectious disease MD.     Hospital course 11/10/2020 - 11/23/2020: Patient presented to the hospital shortness of breath.  He was originally seen at PCPs office and found to be hypoxic.  He was referred to emergency room.  He underwent work-up with CTA chest which showed large right sided pneumonia with groundglass nodules and concern for central cavitation.  He was started on IV antibiotics on admission.  Infectious disease was consulted given prolonged antibiotic course at home and now with atypical pneumonia.  Echocardiogram was negative for valvular vegetation.  MRI was negative for deep infection but revealed a subcutaneous probable seroma.  IR was consulted per ID for aspiration/drainage and culturing of fluid.  He underwent aspiration on 11/13/2020.  Fluid cultures obtained showing no growth.  Patient hospital course complicated by right upper extremity DVT secondary to PICC line.  Patient developed worsening respiratory symptoms and increasing oxygen requirements.  He underwent repeat CT chest which showed worsening right-sided pneumonia.  Pulmonary was consulted and he underwent bronchoscopy on 11/17/2020.  Bronchial washings were generally unremarkable.  There was discussion regarding possibly underlying daptomycin pneumonitis.  Daptomycin was discontinued when cultures patient was de-escalated to Bactrim per ID recommendations.  Patient will follow-up outpatient with ID for ongoing antibiotic management.  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  Patient was discharged home on oxygen.  Will remain on steroids until follow-up with pulmonary on May 17.  He was discharged on Xarelto, given its provoked nature will likely be able to discontinue once imaging is negative in the next 3 to 6 months per protocol.  Patient was discharged on Bactrim and Xanax given profound anxiety in relation to dyspnea.  Patient is aware Xanax is not and should not be permanent fixture or addition to his home medication list and will need follow-up with PCP to manage prescriptions further.    12/01/2020 -Remains on prednisone 27m daily until follow-up and assess when to taper dose -Continues Bactim BID -FOB on 5/3 with negtive pathology, BAL negative -Follow up with Dr. RChase Callerin [facility] clinic in June   Patient reports that he is a little better.  He continues to have some shortness of breath with occasional wheezing.  He has a productive cough which has improved.  Underlying anxiety due to dyspnea symptoms.  He has had no emergencies requiring his albuterol rescue inhaler.  He used this twice since discharge.  He is using flutter valve and incentive spirometer.  He is still on Bactrim and has follow-up with infectious disease in the next week.  Reports difficulty sleeping at night due to steroid.  He has Ambien 5 mg as needed insomnia.  He is using Xanax twice a day as needed for anxiety.  He will follow-up with primary care next Monday. His appetite has improved some. Drinking protein shakes.  Denies fever chills sweats, chest tightness or chest pain.   Imaging:  11/23/20 CXR - Persistent right  pulmonary opacities and right pleural effusion.Aeration improved compared to 11/17/2020.   Assessment & Plan:   Drug-induced pneumonitis Improving; Continues to have some sob, wheezing and cough. Rarely using SABA. Lungs with scant rales right base; otherwise clear. He is requiring less oxygen.  We will begin to taper  prednisone by 23m qweek. Continue Bactrim per ID, if they decide to discontinue he should stay on MWF for PJP prophylaxis. Needs PFTs and CT chest wo contrast. He has follow-up on June 14th with Dr. RChase Caller     Recommendations:  - Taper prednisone 547mx 1 week; 4062m 1 week; 87m69m1 week; 20mg34m week; 10mg 62mweek  - Continue Bactrim as directed by ID (if they want to stop abx please send us a mKoreahart message, we would recommend continuing MWF dosing while on prednisone >20 mg) - Use albuterol rescue inhaler twice a day for the next week or two and prn every 6 hours for breakthrough shortness of breath  - Continue to use flutter valve and Incentive spirometer 2-3 times a day  Orders: - PFTs first available  - CT chest wo contrast re: fu persistent pneumonia/ drug induced pneumonitis  - ONO on room air   Follow-up: - June with Dr. RamswaBrantley Persons14th at 11:30am    Acute respiratory failure with hypoxia (HCC) -[place] saturation on ambulatory walk test today remained above 95% RA. Continue to recommend he use 1-2L with mdoerate exertion and at night. We will check ONO RA.  Anxiety - Anxiety d/t shortness of breath. Discharged from hospital with short term supply of xanax. He will run out 3 days before he will be able to PCP next week who will manage medication moving forward. He is using appropriately without signs of abuse. PMP reviewed. We will refill Xanax 0.5mg BI17mrn #10 tabs  Insomnia - Oral Steroids are causing some insomnia. He take Ambien 5mg prn64m bedtime. Advised not to combine with other sedation medication or alcohol. Medication is not managed by our office    [name] states he has appointment with pulmonologist appt tomorrow. Will go over ONO studies. Ct scheduled later in the week.  CT Friday. Wed follow up with ID.  Pt also has rash on groin area. Still present  Pt anxious. He is using xanax 1 in morning and one at night.  Pt back  pain is getting less. He is weaning off of hydrocodone. He is taking at most 1 tab every 8 hours. Some days less frequent.   Sugars have been running high on review.Pt last a1c 2 weeks ago was about 150. Pt is still on metformin 1000 mg twice a day. Also on januvia  50 mg januvia [place] Since being home his sugars have been 150-160.   Pt had cbc mild elevated wbc count  Pt has high potassium 3 days ago. Also he had low sodium.   Review of Systems  Constitutional: Negative for chills, fatigue and fever.  Respiratory: Positive for shortness of breath. Negative for cough, chest tightness and wheezing.   Cardiovascular: Negative for chest pain and palpitations.  Gastrointestinal: Negative for abdominal pain.  Musculoskeletal: Negative for back pain.  Neurological: Negative for dizziness, speech difficulty, weakness, numbness and headaches.  Hematological: Negative for adenopathy. Does not bruise/bleed easily.  Psychiatric/Behavioral: Negative for behavioral problems and confusion.    Past Medical History:  Diagnosis Date  . Acute pharyngitis 10/21/2013  . Allergy  grass and pollen  . Anxiety and depression 10/25/2011  . BPH (benign prostatic hyperplasia) 04/23/2012  . Chicken pox as a child  . DDD (degenerative disc disease)    cervical responds to steroid injections and low back required surgery  . DDD (degenerative disc disease), lumbosacral   . Diabetes mellitus    pre  . Dyspnea   . ED (erectile dysfunction) 04/23/2012  . Elevated BP   . 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 Esophageal reflux 02/10/2015  . Fatigue   . HTN (hypertension)   . Hyperglycemia    preDM   . Hyperlipidemia   . Insomnia   . Low back pain radiating to both legs 01/16/2017  . Measles as a child  . Overweight(278.02)   . Peripheral neuropathy 08/07/2019  . Personal history of colonic polyps 10/27/2012   Follows with [facility]  . Pneumonia    " walking"  . Preventative health care   .  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  . Wears glasses      Social History   Socioeconomic History  . Marital status: Married    Spouse name: Not on file  . Number of children: Not on file  . Years of education: Not on file  . Highest education level: Not on file  Occupational History  . Not on file  Tobacco Use  . Smoking status: Former Smoker    Packs/day: 1.00    Years: 20.00    Pack years: 20.00    Types: Cigarettes    Quit date: 07/18/1989    Years since quitting: 31.4  . Smokeless tobacco: Never Used  . Tobacco comment: off and on smoking for the 20 yrs  Vaping Use  . Vaping Use: Never used  Substance and Sexual Activity  . Alcohol use: Not Currently    Comment: drink or two a day- mixed drink  . Drug use: No  . Sexual activity: Yes    Comment: lives with wife, still working, no dietary restrictions, continues to exercise intermittently  Other Topics Concern  . Not on file  Social History Narrative  . Not on file   Social Determinants of Health   Financial Resource Strain: Not on file  Food Insecurity: Not on file  Transportation Needs: Not on file  Physical Activity: Not on file  Stress: Not on file  Social Connections: Not on file  Intimate Partner Violence: Not on file    Past Surgical History:  Procedure Laterality Date  . BACK SURGERY  2012 and 1994   Dr Margret Chance, screws and cage in low back  . BIOPSY  01/09/2020   Procedure: BIOPSY;  Surgeon: Otis Brace, MD;  Location: [facility];  Service: Gastroenterology;;  . BRONCHIAL BIOPSY  11/17/2020   Procedure: BRONCHIAL BIOPSIES;  Surgeon: Laurin Coder, MD;  Location: [facility];  Service: Endoscopy;;  . BRONCHIAL BRUSHINGS  11/17/2020   Procedure: BRONCHIAL BRUSHINGS;  Surgeon: Laurin Coder, MD;  Location: [facility];  Service: Endoscopy;;  . BRONCHIAL WASHINGS  11/17/2020   Procedure: BRONCHIAL WASHINGS;  Surgeon: Laurin Coder, MD;  Location: [facility];  Service: Endoscopy;;  .  COLONOSCOPY WITH PROPOFOL N/A 01/09/2020   Procedure: COLONOSCOPY WITH PROPOFOL;  Surgeon: Otis Brace, MD;  Location: [facility];  Service: Gastroenterology;  Laterality: N/A;  . ESOPHAGOGASTRODUODENOSCOPY (EGD) WITH PROPOFOL N/A 01/09/2020   Procedure: ESOPHAGOGASTRODUODENOSCOPY (EGD) WITH PROPOFOL;  Surgeon: Otis Brace, MD;  Location: [facility];  Service: Gastroenterology;  Laterality: N/A;  . EYE SURGERY Bilateral  2016  . HARDWARE REVISION  03/12/2019   REVISION OF HARDWARE THORACIC TEN-THORACIC ELEVEN WITH APPLICATION OF ADDITIONAL RODS AND ROD SLEEVES THORACIC EIGHT-THORACIC TWELVE 12-LUMBAR ONE (N/A )  . HEMOSTASIS CONTROL  11/17/2020   Procedure: HEMOSTASIS CONTROL;  Surgeon: Laurin Coder, MD;  Location: [facility];  Service: Endoscopy;;  . IR US GUIDE BX ASP/DRAIN  11/13/2020  . LAMINECTOMY  02/12/2019   DECOMPRESSIVE LAMINECTOMY THORACIC NINE-THORACIC TEN AND THORACIC TEN-THORACIC ELEVEN, EXTENSION OF THORACOLUMBAR FUSION FROM THORACIC TEN TO THORACIC FIVE, LOCAL BONE GRAFT, ALLOGRAFT AND VIVIGEN (N/A)  . POLYPECTOMY  01/09/2020   Procedure: POLYPECTOMY;  Surgeon: Otis Brace, MD;  Location: [facility];  Service: Gastroenterology;;  . REMOVAL OF RODS AND PEDICLE SCREWS T5 AND T6, EXPLORATION OF FUSION, BIOPSY TRANSPEDICULAR T5 AND T6 (N/A )  09/14/2020  . SPINAL FUSION N/A 02/12/2019   Procedure: DECOMPRESSIVE LAMINECTOMY THORACIC NINE-THORACIC TEN AND THORACIC TEN-THORACIC ELEVEN, EXTENSION OF THORACOLUMBAR FUSION FROM THORACIC TEN TO THORACIC FIVE, LOCAL BONE GRAFT, ALLOGRAFT AND VIVIGEN;  Surgeon: Jessy Oto, MD;  Location: [place];  Service: Orthopedics;  Laterality: N/A;  DECOMPRESSIVE LAMINECTOMY THORACIC NINE-THORACIC TEN AND THORACIC TEN-THORACIC ELEVEN, EXTENSION OF TH  . SPINAL FUSION N/A 03/12/2019   Procedure: REVISION OF HARDWARE THORACIC TEN-THORACIC ELEVEN WITH APPLICATION OF ADDITIONAL RODS AND ROD SLEEVES THORACIC EIGHT-THORACIC TWELVE  12-LUMBAR ONE;  Surgeon: Jessy Oto, MD;  Location: [place];  Service: Orthopedics;  Laterality: N/A;  . TONSILLECTOMY     as child  . torn rotator cuff  2010   right  . VIDEO BRONCHOSCOPY N/A 11/17/2020   Procedure: VIDEO BRONCHOSCOPY WITH FLUORO;  Surgeon: Laurin Coder, MD;  Location: [facility];  Service: Endoscopy;  Laterality: N/A;    Family History  Problem Relation Age of Onset  . Hypertension Mother   . Diabetes Mother        type 2  . Cancer Mother 42       breast in remission  . Emphysema Father        smoker  . COPD Father        smoker  . Stroke Father 16       mini  . Heart disease Father   . Hypertension Sister   . Hyperlipidemia Sister   . Scoliosis Sister   . Osteoporosis Sister   . Hypertension Maternal Grandmother   . Scoliosis Maternal Grandmother   . Heart disease Maternal Grandfather   . Heart disease Paternal Grandfather        smoker  . Heart disease Daughter     No Known Allergies  Current Outpatient Medications on File Prior to Visit  Medication Sig Dispense Refill  . ALPRAZolam (XANAX) 0.5 MG tablet Take 1 tablet (0.5 mg total) by mouth 2 (two) times daily as needed for up to 10 days for anxiety. 10 tablet 0  . atorvastatin (LIPITOR) 10 MG tablet TAKE 1 TABLET BY MOUTH EVERY DAY 30 tablet 0  . b complex vitamins capsule Take 1 capsule by mouth daily.    Marland Kitchen BAYER ASPIRIN EC LOW DOSE 81 MG EC tablet Take 81 mg by mouth every morning.    . busPIRone (BUSPAR) 7.5 MG tablet 1-2 TAB BY MOUTH 3 TIMES A DAY FOR ANXIETY (Patient taking differently: Take 7.5 mg by mouth 2 (two) times daily.) 540 tablet 1  . cetirizine (ZYRTEC) 10 MG tablet Take 10 mg by mouth daily. In the morning    . dextromethorphan-guaiFENesin (MUCINEX DM) 30-600 MG 12hr  tablet Take 1 tablet by mouth 2 (two) times daily. 40 tablet 0  . famotidine (PEPCID) 40 MG tablet TAKE 1 TABLET BY MOUTH EVERYDAY AT BEDTIME (Patient taking differently: Take 40 mg by mouth at bedtime.) 90  tablet 1  . ferrous fumarate (FERRETTS) 325 (106 Fe) MG TABS tablet Take 1 tablet (106 mg of iron total) by mouth daily. 90 tablet 3  . glucose blood (CONTOUR NEXT TEST) test strip Check blood sugar once daily 100 each 12  . JANUVIA 50 MG tablet TAKE 1 TABLET BY MOUTH EVERY DAY 30 tablet 3  . metFORMIN (GLUCOPHAGE) 1000 MG tablet Take 1 tablet (1,000 mg total) by mouth 2 (two) times daily with a meal. 180 tablet 0  . olmesartan (BENICAR) 5 MG tablet Take 1 tablet (5 mg total) by mouth daily. 30 tablet 2  . pantoprazole (PROTONIX) 40 MG tablet TAKE 1 TABLET BY MOUTH TWICE A DAY BEFORE A MEAL (Patient taking differently: Take 40 mg by mouth 2 (two) times daily before a meal.) 180 tablet 1  . predniSONE (DELTASONE) 10 MG tablet Take 5 tablets (50 mg total) by mouth daily with breakfast for 7 days, THEN 4 tablets (40 mg total) daily with breakfast for 7 days, THEN 3 tablets (30 mg total) daily with breakfast for 7 days, THEN 2 tablets (20 mg total) daily with breakfast for 7 days, THEN 1 tablet (10 mg total) daily with breakfast for 7 days. 105 tablet 0  . pregabalin (LYRICA) 75 MG capsule TAKE 1 CAPSULE BY MOUTH TWICE A DAY (Patient taking differently: Take 75 mg by mouth 2 (two) times daily.) 60 capsule 3  . PROVENTIL HFA 108 (90 Base) MCG/ACT inhaler TAKE 2 PUFFS BY MOUTH EVERY 6 HOURS AS NEEDED FOR WHEEZE OR SHORTNESS OF BREATH 6.7 each 5  . Rivaroxaban (XARELTO) 15 MG TABS tablet Take 1 tablet (15 mg total) by mouth 2 (two) times daily with a meal for 16 days. 32 tablet 0  . [START ON 12/10/2020] rivaroxaban (XARELTO) 20 MG TABS tablet Take 1 tablet (20 mg total) by mouth daily with supper. 30 tablet 0  . sodium chloride (OCEAN) 0.65 % SOLN nasal spray Place 1 spray into both nostrils as needed for congestion. 20 mL 0  . sucralfate (CARAFATE) 1 g tablet Take 1 tablet (1 g total) by mouth 4 (four) times daily -  with meals and at bedtime. 120 tablet 1  . sulfamethoxazole-trimethoprim (BACTRIM DS)  800-160 MG tablet Take 1 tablet by mouth every 12 (twelve) hours. 60 tablet 0  . tamsulosin (FLOMAX) 0.4 MG CAPS capsule Take 2 capsules (0.8 mg total) by mouth daily. 180 capsule 3  . testosterone cypionate (DEPOTESTOSTERONE CYPIONATE) 200 MG/ML injection INJECT 1 ML (200 MG TOTAL) INTO THE MUSCLE EVERY 14 (FOURTEEN) DAYS. 2 mL 0  . tiZANidine (ZANAFLEX) 4 MG tablet Take 1.5 tablets (6 mg total) by mouth every 6 (six) hours as needed for muscle spasms. 540 tablet 1  . venlafaxine XR (EFFEXOR-XR) 150 MG 24 hr capsule TAKE 1 CAPSULE (150 MG TOTAL) BY MOUTH DAILY WITH BREAKFAST. 90 capsule 1  . zolpidem (AMBIEN) 5 MG tablet Take 1 tablet (5 mg total) by mouth at bedtime as needed for sleep. 30 tablet 0  . HYDROcodone-acetaminophen (NORCO) 7.5-325 MG tablet Take 1 tablet by mouth every 4 (four) hours as needed for up to 7 days for moderate pain ((score 4 to 6)). 40 tablet 0   Current Facility-Administered Medications on File Prior to Visit  Medication Dose Route Frequency Provider Last Rate Last Admin  . testosterone cypionate (DEPOTESTOSTERONE CYPIONATE) injection 200 mg  200 mg Intramuscular Q14 Days Waylon Hershey, Percell Miller, PA-C   200 mg at 09/11/20 0951    BP (!) 155/91   Pulse 100   Resp (!) 22   Ht _0  (1.778 m)   Wt 157 lb (71.2 kg)   SpO2 99%   BMI 22.53 kg/m       Objective:   Physical Exam  General- No acute distress. Pleasant patient. Neck- Full range of motion, no jvd Lungs- Clear, even and unlabored. Heart- regular rate and rhythm. Neurologic- CNII- XII grossly intact. Abdomen- soft, nt, nd, +bs.   Skin- pt scrotum is mild red under penis.          Assessment & Plan:  History of eosinophilic pneumonia..  Presently on high-dose prednisone and will taper over the upcoming weeks.  Follow-up with pulmonologist tomorrow.  Continue daily use of oxygen.  Signed [place] oxygen form.  History of diabetes with A1c 6.9 weeks ago.  However recently since using high dose  prednisone sugars have trended upward.  However since discharge from the hospital most days sugar was less than 200.  However earlier today sugar was 234.  Continue metformin 1000 mg twice daily and Januvia 50 mg daily.  Eat low sugar diet.  Check blood sugars daily.  If sugars are not trending above 200 let me know.  In that event would prescribe mealtime insulin and give sliding scale instructions.  Chronic back pain.  Continue Norco.  However do try to taper off as you have equipment removed now.  Glad to hear pain has decreased some.  Follow infectious disease recommendations regarding possible continuation of antibiotics.  History of low T.  Testosterone injection given today.  History of anxiety and a bit worse recently with reported dyspnea and high-dose prednisone use.  Refilled Xanax today.  Fungal infection recently.  Likely related to sugar levels and prednisone use.  Prescribed nystatin cream.  If rash persist on scrotum then consider oral antifungal.  Hypokalemia and low sodium.  Recent Bactrim and ARB discontinued.  Patient has follow-up with ID with planned repeat labs in 2 days.  I recommend presently hydrate well with [employer] water or sugar-free Gatorade.  Consult foods.  Presently advised not to over hydrate with plain water.  Follow-up in 2 weeks or as needed.  Time spent with patient today was  44 minutes which consisted of chart review, discussing diagnosis, work up treatment and documentation.

## 2020-12-07 NOTE — Telephone Encounter (Signed)
-----   Message from Luis Hayward, MD sent at 12/07/2020  8:00 AM EDT ----- Called pt on Saturday and asked him to stop bactrim and his ARB  I would likee him to have a repeat BMP today or tomorrow in clinic and then we can look at other optinos (there are nto many) for po antibiotics for him

## 2020-12-07 NOTE — Telephone Encounter (Signed)
Patient unable to come in for labs today or tomorrow, scheduled for Wednesday 12/09/20.   Patient states he saw his pulmonologist who is prescribing him prednisone. He says his pulmonologist told him he should be taking the Bactrim at least Monday, Wednesday, Friday with the prednisone.   Patient last took Bactrim Friday (5/20) and stopped on Saturday per Dr. Tommy Medal. Will route to provider.   Beryle Flock, RN

## 2020-12-07 NOTE — Addendum Note (Signed)
Addended by: Lucie Leather D on: 12/07/2020 09:56 AM   Modules accepted: Orders

## 2020-12-07 NOTE — Telephone Encounter (Signed)
Spoke with patient, advised him that Dr. Tommy Medal would like him to continue holding the Bactrim and that he would like him to be seen by a provider. Patient schedule to see Dr. Juleen China 12/09/20. Patient verbalized understanding and has no further questions.   Beryle Flock, RN

## 2020-12-08 ENCOUNTER — Ambulatory Visit (INDEPENDENT_AMBULATORY_CARE_PROVIDER_SITE_OTHER): Payer: Medicare Other | Admitting: Internal Medicine

## 2020-12-08 DIAGNOSIS — T50905A Adverse effect of unspecified drugs, medicaments and biological substances, initial encounter: Secondary | ICD-10-CM

## 2020-12-08 DIAGNOSIS — J189 Pneumonia, unspecified organism: Secondary | ICD-10-CM

## 2020-12-08 DIAGNOSIS — J9601 Acute respiratory failure with hypoxia: Secondary | ICD-10-CM

## 2020-12-08 LAB — PULMONARY FUNCTION TEST
DL/VA % pred: 120 %
DL/VA: 4.75 ml/min/mmHg/L
DLCO cor % pred: 82 %
DLCO cor: 20.52 ml/min/mmHg
DLCO unc % pred: 72 %
DLCO unc: 18.01 ml/min/mmHg
FEF 25-75 Post: 1.26 L/sec
FEF 25-75 Pre: 1.15 L/sec
FEF2575-%Change-Post: 9 %
FEF2575-%Pred-Post: 59 %
FEF2575-%Pred-Pre: 54 %
FEV1-%Change-Post: 5 %
FEV1-%Pred-Post: 69 %
FEV1-%Pred-Pre: 65 %
FEV1-Post: 2.07 L
FEV1-Pre: 1.97 L
FEV1FVC-%Change-Post: 5 %
FEV1FVC-%Pred-Pre: 91 %
FEV6-%Change-Post: -1 %
FEV6-%Pred-Post: 74 %
FEV6-%Pred-Pre: 76 %
FEV6-Post: 2.91 L
FEV6-Pre: 2.96 L
FEV6FVC-%Change-Post: 0 %
FEV6FVC-%Pred-Post: 105 %
FEV6FVC-%Pred-Pre: 106 %
FVC-%Change-Post: 0 %
FVC-%Pred-Post: 71 %
FVC-%Pred-Pre: 71 %
FVC-Post: 2.96 L
FVC-Pre: 2.97 L
Post FEV1/FVC ratio: 70 %
Post FEV6/FVC ratio: 99 %
Pre FEV1/FVC ratio: 66 %
Pre FEV6/FVC Ratio: 99 %
RV % pred: 95 %
RV: 2.48 L
TLC % pred: 71 %
TLC: 5.04 L

## 2020-12-08 NOTE — Progress Notes (Signed)
PFT done today. 

## 2020-12-09 ENCOUNTER — Ambulatory Visit (INDEPENDENT_AMBULATORY_CARE_PROVIDER_SITE_OTHER): Payer: Medicare Other | Admitting: Internal Medicine

## 2020-12-09 ENCOUNTER — Encounter: Payer: Self-pay | Admitting: Internal Medicine

## 2020-12-09 ENCOUNTER — Other Ambulatory Visit: Payer: Self-pay

## 2020-12-09 ENCOUNTER — Other Ambulatory Visit: Payer: Medicare Other

## 2020-12-09 VITALS — BP 128/88 | HR 118 | Temp 97.7°F | Wt 156.8 lb

## 2020-12-09 DIAGNOSIS — J8281 Chronic eosinophilic pneumonia: Secondary | ICD-10-CM

## 2020-12-09 DIAGNOSIS — E875 Hyperkalemia: Secondary | ICD-10-CM

## 2020-12-09 DIAGNOSIS — E1142 Type 2 diabetes mellitus with diabetic polyneuropathy: Secondary | ICD-10-CM | POA: Diagnosis not present

## 2020-12-09 DIAGNOSIS — M4624 Osteomyelitis of vertebra, thoracic region: Secondary | ICD-10-CM

## 2020-12-09 DIAGNOSIS — M5137 Other intervertebral disc degeneration, lumbosacral region: Secondary | ICD-10-CM | POA: Diagnosis not present

## 2020-12-09 DIAGNOSIS — Z298 Encounter for other specified prophylactic measures: Secondary | ICD-10-CM | POA: Diagnosis not present

## 2020-12-09 DIAGNOSIS — J9601 Acute respiratory failure with hypoxia: Secondary | ICD-10-CM | POA: Diagnosis not present

## 2020-12-09 DIAGNOSIS — Z7982 Long term (current) use of aspirin: Secondary | ICD-10-CM | POA: Diagnosis not present

## 2020-12-09 DIAGNOSIS — E785 Hyperlipidemia, unspecified: Secondary | ICD-10-CM | POA: Diagnosis not present

## 2020-12-09 DIAGNOSIS — G4700 Insomnia, unspecified: Secondary | ICD-10-CM | POA: Diagnosis not present

## 2020-12-09 DIAGNOSIS — Z7984 Long term (current) use of oral hypoglycemic drugs: Secondary | ICD-10-CM | POA: Diagnosis not present

## 2020-12-09 DIAGNOSIS — I82409 Acute embolism and thrombosis of unspecified deep veins of unspecified lower extremity: Secondary | ICD-10-CM | POA: Diagnosis not present

## 2020-12-09 DIAGNOSIS — I10 Essential (primary) hypertension: Secondary | ICD-10-CM | POA: Diagnosis not present

## 2020-12-09 DIAGNOSIS — E871 Hypo-osmolality and hyponatremia: Secondary | ICD-10-CM | POA: Diagnosis not present

## 2020-12-09 MED ORDER — DAPSONE 100 MG PO TABS: 100.0000 mg | ORAL_TABLET | Freq: Every day | ORAL | 1 refills | Status: DC

## 2020-12-09 NOTE — Telephone Encounter (Signed)
That you for update, I would follow what Dr. Tommy Medal told him. Ok to stop until lab work/visit

## 2020-12-09 NOTE — Progress Notes (Signed)
[facility]  CHIEF COMPLAINT:    Follow up for wound injection, AKI, eosinophilic pneumonia  SUBJECTIVE:    Luis Brown is a 75 y.o. male with PMHx as below who presents to the clinic for follow-up of wound infection, AKI, and eosinophilic pneumonia.   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  He also was noted to have loosening of screws which were subsequently removed by Dr. Louanne Skye due to continuous pain at that site.  There was no noted purulence but cultures did yield staph epidermidis.  Patient was initially seen by Dr. Gale Journey who placed patient on IV daptomycin with an initial end of therapy date being 11/15/2020 with plan to transition back to Bactrim for suppression at that point.  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  As a result, he was continued on Bactrim which was treating his back infection but also providing PCP prophylaxis in the setting of steroids.  He is currently on 40 milligrams of prednisone daily.  He had a follow-up appointment with Dr. Tommy Medal on 12/04/2020.  Labs that day were notable for WBC 11.8, hemoglobin 16.4, platelets 346.  CRP 24, ESR 9.  BMP was notable for creatinine 0.8, sodium 121, potassium 5.8.  He was advised to stop his Bactrim as well as his ARB.  He presents today for repeat lab work-up and to discuss antibiotic options.   He is feeling well today without any new issues. He reports that his back pain is actually better.  He has been holding his Bactrim as recommended previously.  He also notes that he has not been taking his olmesartan for several weeks since BP was under control.    Please see A&P for the details of today's visit and status of the patient's medical problems.    Patient's Medications  New Prescriptions   DAPSONE 100 MG TABLET    Take 1 tablet (100 mg total) by mouth daily.  Previous Medications   ALPRAZOLAM (XANAX) 0.5 MG TABLET    Take 1 tablet (0.5 mg total) by mouth 2 (two) times daily as needed for anxiety.   ATORVASTATIN (LIPITOR) 10 MG TABLET    TAKE 1 TABLET BY MOUTH EVERY DAY   B COMPLEX VITAMINS CAPSULE    Take 1 capsule by mouth daily.   BAYER ASPIRIN EC LOW DOSE 81 MG EC TABLET    Take 81 mg by mouth every morning.   BUSPIRONE (BUSPAR) 7.5 MG TABLET    1-2 TAB BY MOUTH 3 TIMES A DAY FOR ANXIETY   CETIRIZINE (ZYRTEC) 10 MG TABLET    Take 10 mg by mouth daily. In the morning   DEXTROMETHORPHAN-GUAIFENESIN (MUCINEX DM) 30-600 MG 12HR TABLET    Take 1 tablet by mouth 2 (two) times daily.   FAMOTIDINE (PEPCID) 40 MG TABLET    TAKE 1 TABLET BY MOUTH EVERYDAY AT BEDTIME   FERROUS FUMARATE (FERRETTS) 325 (106 FE) MG TABS TABLET    Take 1 tablet (106 mg of iron total) by mouth daily.   GLUCOSE BLOOD (CONTOUR NEXT TEST) TEST STRIP    Check blood sugar once daily   HYDROCODONE-ACETAMINOPHEN (NORCO) 7.5-325 MG TABLET    Take 1 tablet by mouth every 4 (four) hours as needed for up to 7 days for  moderate pain ((score 4 to 6)).   JANUVIA 50 MG TABLET    TAKE 1 TABLET BY MOUTH EVERY DAY   METFORMIN (GLUCOPHAGE) 1000 MG TABLET    Take 1 tablet (1,000 mg total) by mouth 2 (two) times daily with a meal.   NYSTATIN CREAM (MYCOSTATIN)    Apply 1 application topically 2 (two) times daily.   OLMESARTAN (BENICAR) 5 MG TABLET    Take 1 tablet (5 mg total) by mouth daily.   PANTOPRAZOLE (PROTONIX) 40 MG TABLET    TAKE 1 TABLET BY MOUTH TWICE A DAY BEFORE A MEAL   PREDNISONE (DELTASONE) 10 MG TABLET    Take 5 tablets (50 mg total) by mouth daily with breakfast for 7 days, THEN 4 tablets (40 mg total) daily with breakfast for 7 days, THEN 3 tablets (30 mg total) daily with breakfast for 7 days, THEN 2 tablets (20 mg total) daily with breakfast for 7 days, THEN 1  tablet (10 mg total) daily with breakfast for 7 days.   PREGABALIN (LYRICA) 75 MG CAPSULE    TAKE 1 CAPSULE BY MOUTH TWICE A DAY   PROVENTIL HFA 108 (90 BASE) MCG/ACT INHALER    TAKE 2 PUFFS BY MOUTH EVERY 6 HOURS AS NEEDED FOR WHEEZE OR SHORTNESS OF BREATH   RIVAROXABAN (XARELTO) 15 MG TABS TABLET    Take 1 tablet (15 mg total) by mouth 2 (two) times daily with a meal for 16 days.   RIVAROXABAN (XARELTO) 20 MG TABS TABLET    Take 1 tablet (20 mg total) by mouth daily with supper.   SODIUM CHLORIDE (OCEAN) 0.65 % SOLN NASAL SPRAY    Place 1 spray into both nostrils as needed for congestion.   SUCRALFATE (CARAFATE) 1 G TABLET    Take 1 tablet (1 g total) by mouth 4 (four) times daily -  with meals and at bedtime.   TAMSULOSIN (FLOMAX) 0.4 MG CAPS CAPSULE    Take 2 capsules (0.8 mg total) by mouth daily.   TESTOSTERONE CYPIONATE (DEPOTESTOSTERONE CYPIONATE) 200 MG/ML INJECTION    INJECT 1 ML (200 MG TOTAL) INTO THE MUSCLE EVERY 14 (FOURTEEN) DAYS.   TIZANIDINE (ZANAFLEX) 4 MG TABLET    Take 1.5 tablets (6 mg total) by mouth every 6 (six) hours as needed for muscle spasms.   VENLAFAXINE XR (EFFEXOR-XR) 150 MG 24 HR CAPSULE    TAKE 1 CAPSULE (150 MG TOTAL) BY MOUTH DAILY WITH BREAKFAST.  Modified Medications   No medications on file  Discontinued Medications   SULFAMETHOXAZOLE-TRIMETHOPRIM (BACTRIM DS) 800-160 MG TABLET    Take 1 tablet by mouth every 12 (twelve) hours.      Past Medical History:  Diagnosis Date  . Acute pharyngitis 10/21/2013  . Allergy    grass and pollen  . Anxiety and depression 10/25/2011  . BPH (benign prostatic hyperplasia) 04/23/2012  . Chicken pox as a child  . DDD (degenerative disc disease)    cervical responds to steroid injections and low back required surgery  . DDD (degenerative disc disease), lumbosacral   . Diabetes mellitus    pre  . Dyspnea   . ED (erectile dysfunction) 04/23/2012  . Elevated BP   . 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 Esophageal reflux 02/10/2015   . Fatigue   . HTN (hypertension)   . Hyperglycemia    preDM   . Hyperlipidemia   . Insomnia   . Low back pain radiating to both legs 01/16/2017  . Measles as a child  .  Overweight(278.02)   . Peripheral neuropathy 08/07/2019  . Personal history of colonic polyps 10/27/2012   Follows with [facility]  . Pneumonia    " walking"  . Preventative health care   . 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  . Wears glasses     Social History   Tobacco Use  . Smoking status: Former Smoker    Packs/day: 1.00    Years: 20.00    Pack years: 20.00    Types: Cigarettes    Quit date: 07/18/1989    Years since quitting: 31.4  . Smokeless tobacco: Never Used  . Tobacco comment: off and on smoking for the 20 yrs  Vaping Use  . Vaping Use: Never used  Substance Use Topics  . Alcohol use: Not Currently    Comment: drink or two a day- mixed drink  . Drug use: No    Family History  Problem Relation Age of Onset  . Hypertension Mother   . Diabetes Mother        type 2  . Cancer Mother 23       breast in remission  . Emphysema Father        smoker  . COPD Father        smoker  . Stroke Father 28       mini  . Heart disease Father   . Hypertension Sister   . Hyperlipidemia Sister   . Scoliosis Sister   . Osteoporosis Sister   . Hypertension Maternal Grandmother   . Scoliosis Maternal Grandmother   . Heart disease Maternal Grandfather   . Heart disease Paternal Grandfather        smoker  . Heart disease Daughter     No Known Allergies  Review of Systems  Constitutional: Negative.   Respiratory: Positive for shortness of breath. Negative for cough.   Cardiovascular: Negative.   Musculoskeletal: Positive for back pain (improved).  Skin: Negative.      OBJECTIVE:    Vitals:   12/09/20 1338 12/09/20 1339  BP:  128/88  Pulse:  (!) 118  Temp:  97.7 F (36.5 C)  SpO2:  100%  Weight: 156 lb 12.8 oz (71.1 kg)    Body mass index is 22.5 kg/m.  Physical  Exam Constitutional:      General: He is not in acute distress.    Appearance: Normal appearance.  HENT:     Head: Normocephalic and atraumatic.  Pulmonary:     Effort: Pulmonary effort is normal. No respiratory distress.     Comments: Wearing nasal cannula Skin:    General: Skin is warm and dry.  Neurological:     General: No focal deficit present.     Mental Status: He is alert and oriented to person, place, and time.  Psychiatric:        Mood and Affect: Mood normal.        Behavior: Behavior normal.      Labs and Microbiology: CBC Latest Ref Rng & Units 12/04/2020 11/22/2020 11/21/2020  WBC 3.8 - 10.8 Thousand/uL 11.8(H) 11.3(H) 14.6(H)  Hemoglobin 13.2 - 17.1 g/dL 16.4 10.9(L) 11.4(L)  Hematocrit 38.5 - 50.0 % 50.9(H) 33.7(L) 34.9(L)  Platelets 140 - 400 Thousand/uL 346 388 360   CMP Latest Ref Rng & Units 12/04/2020 11/22/2020 11/21/2020  Glucose 65 - 99 mg/dL 245(H) 177(H) 222(H)  BUN 7 - 25 mg/dL $Remove'23 20 22  'cQODxmV$ Creatinine 0.70 - 1.18 mg/dL 0.84 0.84 0.69  Sodium 135 - 146 mmol/L 121(L) 133(L) 131(L)  Potassium 3.5 - 5.3 mmol/L 5.8(H) 3.7 3.9  Chloride 98 - 110 mmol/L 81(L) 100 97(L)  CO2 20 - 32 mmol/L $RemoveB'31 25 25  'WUwTmDzY$ Calcium 8.6 - 10.3 mg/dL 10.2 8.0(L) 8.3(L)  Total Protein 6.0 - 8.3 g/dL - - -  Total Bilirubin 0.2 - 1.2 mg/dL - - -  Alkaline Phos 39 - 117 U/L - - -  AST 0 - 37 U/L - - -  ALT 0 - 53 U/L - - -     Recent Results (from the past 240 hour(s))  SARS CORONAVIRUS 2 (TAT 6-24 HRS) Nasopharyngeal Nasopharyngeal Swab     Status: None   Collection Time: 12/04/20 11:23 AM   Specimen: Nasopharyngeal Swab  Result Value Ref Range Status   SARS Coronavirus 2 NEGATIVE NEGATIVE Final    Comment: (NOTE) SARS-CoV-2 target nucleic acids are NOT DETECTED.  The SARS-CoV-2 RNA is generally detectable in upper and lower respiratory specimens during the acute phase of infection. Negative results do not preclude SARS-CoV-2 infection, do not rule out co-infections with other  pathogens, and should not be used as the sole basis for treatment or other patient management decisions. Negative results must be combined with clinical observations, patient history, and epidemiological information. The expected result is Negative.  Fact Sheet for Patients: SugarRoll.be  Fact Sheet for Healthcare Providers: https://www.woods-mathews.com/  This test is not yet approved or cleared by the [place] FDA and  has been authorized for detection and/or diagnosis of SARS-CoV-2 by FDA under an Emergency Use Authorization (EUA). This EUA will remain  in effect (meaning this test can be used) for the duration of the COVID-19 declaration under Se ction 564(b)(1) of the Act, 21 U.S.C. section 360bbb-3(b)(1), unless the authorization is terminated or revoked sooner.  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 Need for pneumocystis prophylaxis 3. 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 Hyperkalemia  Discussed with patient regarding spine infection.  He has received 9 weeks of therapy with daptomycin --> TMP SMX with improvement in back pain.  Also had hardware in this area removed so presumably nidus of infection has been dealt with.  Certainly other hardware remains in place so this can potentially continue to be an issue, but his inflammatory markers are improved with ESR normal and CRP normal as well.  Due to side effect of TMP SMX and limited oral options (linezolid complicated by Effexor, possibly could consider omadacycline) discussed seeing how he does off antibiotics.  He is agreeable to this option.  Discussed that it is not clear if infection is cured or simply being suppressed but the only way to know is to trial off antibiotics.  Will follow closely clinically and see how he does off atbx.  Will check BMP today to  reassess electrolyte abnormalities.  Will check G6PD level and start dapsone $RemoveBefor'100mg'dhfVFzAbyDQb$  daily for PCP ppx while on high dose prednisone.  RTC 4 weeks at which point can repeat inflammatory markers to ensure stability.    Orders Placed This Encounter  Procedures  . Basic metabolic panel    Order Specific Question:   Has the patient fasted?    Answer:   No  . Glucose 6 phosphate dehydrogenase       [facility] 12/09/2020, 2:06 PM

## 2020-12-09 NOTE — Telephone Encounter (Signed)
Received the following message from patient:   "Hi, Dr. Tommy Medal called ike Saturday and told him to stop taking this because of blood work results. Ike told them that you said he needs to take it MWF at a minimum. He said not to take it until his appointment today at 1:45 for a blood culture."  Patient was told at this last OV to let us know if the ID doctor asked him to stop the Bactrim. Will send to [facility] so she is aware.

## 2020-12-09 NOTE — Patient Instructions (Signed)
Thank you for coming to see me today. It was a pleasure seeing you.  To Do: Marland Kitchen Stop your Bactrim  . Continue prednisone as recommended by pulmonology . Labs today . Start a new medication to prevent pneumocystis pneumonia.  It is called Dapsone and prescription was sent to your pharmacy.  If you have any questions or concerns, please do not hesitate to call the office at [phone].  Take Care,   Jule Ser, DO

## 2020-12-09 NOTE — Telephone Encounter (Signed)
Ok, Thank you. No changes to our plan

## 2020-12-09 NOTE — Telephone Encounter (Signed)
Patient sent a message through my chart stating he is starting Dapsone tomorrow.   Message routed to [place], NP as Juluis Rainier

## 2020-12-10 ENCOUNTER — Other Ambulatory Visit: Payer: Self-pay | Admitting: Medical

## 2020-12-10 LAB — BASIC METABOLIC PANEL
BUN: 18 mg/dL (ref 7–25)
CO2: 29 mmol/L (ref 20–32)
Calcium: 9.5 mg/dL (ref 8.6–10.3)
Chloride: 83 mmol/L — ABNORMAL LOW (ref 98–110)
Creat: 0.75 mg/dL (ref 0.70–1.18)
Glucose, Bld: 378 mg/dL — ABNORMAL HIGH (ref 65–99)
Potassium: 5 mmol/L (ref 3.5–5.3)
Sodium: 121 mmol/L — ABNORMAL LOW (ref 135–146)

## 2020-12-10 LAB — GLUCOSE 6 PHOSPHATE DEHYDROGENASE: G-6PDH: 19.2 U/g Hgb (ref 7.0–20.5)

## 2020-12-10 NOTE — Telephone Encounter (Signed)
Requesting: testosterone Contract: 01/25/18 UDS: 01/25/18 Last Visit: 12/07/20 Next Visit: 12/21/20 Last Refill: 09/30/20  Please Advise

## 2020-12-11 ENCOUNTER — Telehealth: Payer: Self-pay

## 2020-12-11 ENCOUNTER — Ambulatory Visit
Admission: RE | Admit: 2020-12-11 | Discharge: 2020-12-11 | Disposition: A | Discharge status: Home / Self Care | Payer: Medicare Other | Source: Ambulatory Visit | Attending: Primary Care | Admitting: Primary Care

## 2020-12-11 DIAGNOSIS — R0602 Shortness of breath: Secondary | ICD-10-CM | POA: Diagnosis not present

## 2020-12-11 DIAGNOSIS — R918 Other nonspecific abnormal finding of lung field: Secondary | ICD-10-CM | POA: Diagnosis not present

## 2020-12-11 DIAGNOSIS — J189 Pneumonia, unspecified organism: Secondary | ICD-10-CM

## 2020-12-11 NOTE — Telephone Encounter (Signed)
-----   Message from Mignon Pine, DO sent at 12/11/2020  9:32 AM EDT ----- Please let patient know his labs are better this week.  Potassium normalized and sodium was improved when corrected for his hyperglycemia.  His G6PD level was normal so okay to continue Dapsone for PCP ppx.

## 2020-12-11 NOTE — Telephone Encounter (Signed)
Patient called back. RN advised patient per Dr. Juleen China that his labs have improved. His potassium has normalized and his sodium has improved, as well as his G6PD level was normal. Advised patient that Dr. Juleen China would like him to continue dapsone for pneumonia prophylaxis. Patient verbalized understanding and has no further questions.   Beryle Flock, RN

## 2020-12-11 NOTE — Telephone Encounter (Signed)
rx testosterone sent to pt pharmacy.

## 2020-12-13 ENCOUNTER — Telehealth: Payer: Self-pay | Admitting: Pulmonary Disease

## 2020-12-13 ENCOUNTER — Encounter: Payer: Self-pay | Admitting: Medical

## 2020-12-13 DIAGNOSIS — J9383 Other pneumothorax: Secondary | ICD-10-CM

## 2020-12-13 NOTE — Telephone Encounter (Signed)
Received a critical report from the radiologist about the CT Chest performed on 12/11/20.   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   I spoke with the patient and he does report some increase in shortness of breath over recent days but denies chest pain, dizziness or feeling light headed. He does notice some wheezing.   I informed him of the pneumothorax and that his options would be to go to the ER for an updated chest x-ray and likely chest tube placement or option #2 would be to rest at home, use his supplemental oxygen 24/7 with follow up in clinic on 5/31 for repeat chest x-ray.   He has opted for option #2 at this time, so please schedule patient for follow up on 5/31.   I instructed him if he develops worsening shortness of breath, chest pain, dizziness or light headedness that he should head to the ER.   Freda Jackson, MD [facility] Office: [phone]   See Amion for personal pager PCCM on call pager [phone] until 7pm. Please call Elink 7p-7a. [phone]

## 2020-12-14 NOTE — Telephone Encounter (Signed)
Please schedule patient a visit tomorrow 5/31 with CXR prior. I am in [facility] Tuesday and Wednesday.

## 2020-12-15 ENCOUNTER — Ambulatory Visit: Payer: Medicare Other

## 2020-12-15 ENCOUNTER — Ambulatory Visit
Admission: RE | Admit: 2020-12-15 | Discharge: 2020-12-15 | Disposition: A | Discharge status: Home / Self Care | Payer: Medicare Other | Source: Ambulatory Visit | Attending: Primary Care | Admitting: Primary Care

## 2020-12-15 ENCOUNTER — Encounter: Payer: Self-pay | Admitting: Primary Care

## 2020-12-15 ENCOUNTER — Ambulatory Visit
Admission: RE | Admit: 2020-12-15 | Discharge: 2020-12-15 | Disposition: A | Discharge status: Home / Self Care | Payer: Medicare Other | Attending: Primary Care | Admitting: Primary Care

## 2020-12-15 ENCOUNTER — Ambulatory Visit (INDEPENDENT_AMBULATORY_CARE_PROVIDER_SITE_OTHER): Payer: Medicare Other | Admitting: Primary Care

## 2020-12-15 ENCOUNTER — Other Ambulatory Visit: Payer: Self-pay

## 2020-12-15 VITALS — BP 126/78 | HR 97 | Temp 98.0°F | Ht 70.0 in | Wt 158.0 lb

## 2020-12-15 DIAGNOSIS — J939 Pneumothorax, unspecified: Secondary | ICD-10-CM | POA: Diagnosis not present

## 2020-12-15 DIAGNOSIS — J189 Pneumonia, unspecified organism: Secondary | ICD-10-CM

## 2020-12-15 DIAGNOSIS — J9383 Other pneumothorax: Secondary | ICD-10-CM | POA: Insufficient documentation

## 2020-12-15 DIAGNOSIS — T50905A Adverse effect of unspecified drugs, medicaments and biological substances, initial encounter: Secondary | ICD-10-CM | POA: Diagnosis not present

## 2020-12-15 NOTE — Telephone Encounter (Signed)
Patient means Xanax

## 2020-12-15 NOTE — Assessment & Plan Note (Addendum)
-   Continues to improve. He experiences slight dyspnea with moderate activity and some upper airway wheezing - Tapering prednisone by 10mg  every week. Currently on 30mg  daily -  Following with ID.  - He has an appointment with Dr. Chase Caller scheduled for June 14th.

## 2020-12-15 NOTE — Telephone Encounter (Signed)
Ov scheduled for 2:00 today with CXR prior.  Provided patient with address to [place] office.  Nothing further needed at this time.

## 2020-12-15 NOTE — Addendum Note (Signed)
Addended by: Claudette Head A on: 12/15/2020 10:30 AM   Modules accepted: Orders

## 2020-12-15 NOTE — Patient Instructions (Addendum)
CXR today showed resolution of right lung airspace opacities (pneumonia) since prior radiograph. Overall improvement apical pneumothorax that was seen on previous CT chest, possibly still present on lateral view. Plan repeat CXR on Thursday to monitor. Continue to taper prednisone as instructed. Continue to wear oxygen 2L continuous for now.   Orders: CXR Thursday in [place] (ordered)  Follow-up: Visit scheduled on June 14th with Dr. Chase Caller   Pneumothorax A pneumothorax is commonly called a collapsed lung. It is a condition in which air leaks from a lung and builds up between the thin layer of tissue that covers the lungs (visceral pleura) and the interior wall of the chest cavity (parietal pleura). The air gets trapped outside the lung, between the lung and the chest wall (pleural space). The air takes up space and prevents the lung from fully expanding. This condition sometimes occurs suddenly with no apparent cause. The buildup of air may be small or large. A small pneumothorax may go away on its own. A large pneumothorax will require treatment and hospitalization. What are the causes? This condition may be caused by:  Trauma and injury to the chest wall.  Surgery and other medical procedures.  A complication of an underlying lung problem, especially chronic obstructive pulmonary disease (COPD) or emphysema. Sometimes the cause of this condition is not known. What increases the risk? You are more likely to develop this condition if:  You have an underlying lung problem.  You smoke.  You are 26-25 years old, male, tall, and underweight.  You have a personal or family history of pneumothorax.  You have an eating disorder (anorexia nervosa). This condition can also happen quickly, even in people with no history of lung problems. What are the signs or symptoms? Sometimes a pneumothorax will have no symptoms. When symptoms are present, they can include:  Chest  pain.  Shortness of breath.  Increased rate of breathing.  Bluish color to your lips or skin (cyanosis). How is this diagnosed? This condition may be diagnosed by:  A medical history and physical exam.  A chest X-ray, chest CT scan, or ultrasound. How is this treated? Treatment depends on how severe your condition is. The goal of treatment is to remove the extra air and allow your lung to expand back to its normal size.  For a small pneumothorax: ? No treatment may be needed. ? Extra oxygen is sometimes used to make it go away more quickly.  For a large pneumothorax or one that is causing symptoms, a procedure is done to release the air from around your lungs. To do this, a health care provider may use: ? A needle with a syringe. This is used to suck air from a pleural space where no additional leakage is taking place. ? A chest tube. This is used to suck air where there is ongoing leakage into the pleural space. The chest tube may need to remain in place for several days until the air leak has healed.  In more severe cases, surgery may be needed to repair the damage that is causing the leak.  If you have multiple pneumothorax episodes or have an air leak that will not heal, a procedure called a pleurodesis may be done. A medicine is placed in the pleural space to irritate the tissues around the lung so that the lung will stick to the chest wall, seal any leaks, and stop any buildup of air in that space. If you have an underlying lung problem, severe symptoms, or  a large pneumothorax you will usually need to stay in the hospital.   Follow these instructions at home: Lifestyle  Do not use any products that contain nicotine or tobacco, such as cigarettes and e-cigarettes. These are major risk factors in pneumothorax. If you need help quitting, ask your health care provider.  Do not lift anything that is heavier than 10 lb (4.5 kg), or the limit that your health care provider tells you,  until he or she says that it is safe.  Avoid activities that take a lot of effort (strenuous) for as long as told by your health care provider.  Return to your normal activities as told by your health care provider. Ask your health care provider what activities are safe for you.  Do not fly in an airplane or scuba dive until your health care provider says it is okay. General instructions  Take over-the-counter and prescription medicines only as told by your health care provider.  If a cough or pain makes it difficult for you to sleep at night, try sleeping in a semi-upright position in a recliner or by using 2 or 3 pillows.  If you had a chest tube and it was removed, ask your health care provider when you can remove the bandage (dressing). While the dressing is in place, do not allow it to get wet.  Keep all follow-up visits as told by your health care provider. This is important. Contact a health care provider if:  You cough up thick mucus (sputum) that is yellow or green in color.  You were treated with a chest tube, and you have redness, increasing pain, or discharge at the site where it was placed. Get help right away if:  You have increasing chest pain or shortness of breath.  You have a cough that will not go away.  You begin coughing up blood.  You have pain that is getting worse or is not controlled with medicines.  The site where your chest tube was located opens up.  You feel air coming out of the site where the chest tube was placed.  You have a fever or persistent symptoms for more than 2-3 days. These symptoms may represent a serious problem that is an emergency. Do not wait to see if the symptoms will go away. Get medical help right away. Call your local emergency services (911 in the [place] Do not drive yourself to the hospital. Summary  A pneumothorax, commonly called a collapsed lung, is a condition in which air leaks from a lung and gets trapped between the  lung and the chest wall (pleural space).  The buildup of air may be small or large. A small pneumothorax may go away on its own. A large pneumothorax will require treatment and hospitalization.  Treatment for this condition depends on how severe the pneumothorax is. The goal of treatment is to remove the extra air and allow the lung to expand back to its normal size. This information is not intended to replace advice given to you by your health care provider. Make sure you discuss any questions you have with your health care provider. Document Revised: 03/05/2020 Document Reviewed: 03/05/2020 Elsevier Patient Education  2021 [employer].

## 2020-12-15 NOTE — Telephone Encounter (Signed)
lmtcb for pt.   Schedule with Derl Barrow, NP, at 3:30p (held spot). Per Eustaquio Maize, pt will likely go to ED and should bring an overnight bag just in case. If pt wears O2, pt should bring that as well.   Will hold in triage to call pt again. Pt needs to be seen today.

## 2020-12-15 NOTE — Progress Notes (Signed)
_0  ID: Luis Brown, male    DOB: 08-Apr-1946, 75 y.o.   MRN: [id number]  Chief Complaint  Patient presents with  . Follow-up    Sob is baseline since last OV.     Referring provider: Elise Benne  HPI: 75 year old male, former smoker (20-pack-year history).  Past medical history significant hypertension, DVT, drug-induced pneumonitis, eosinophilic pneumonia, acute respiratory failure with hypoxia, GERD, gastric ulcer, alcohol abuse, protein calorie malnutrition, hyperlipidemia, anxiety/depression.  Patient was recently admitted to [facility] from 11/10/2020 - 11/23/2020 for drug induced pneumonitis.  Hospital course 11/10/2020 - 11/23/2020: Patient presented to the hospital shortness of breath.  He was originally seen at PCPs office and found to be hypoxic.  He was referred to emergency room.  He underwent work-up with CTA chest which showed large right sided pneumonia with groundglass nodules and concern for central cavitation.  He was started on IV antibiotics on admission.  Infectious disease was consulted given prolonged antibiotic course at home and now with atypical pneumonia.  Echocardiogram was negative for valvular vegetation.  MRI was negative for deep infection but revealed a subcutaneous probable seroma.  IR was consulted per ID for aspiration/drainage and culturing of fluid.  He underwent aspiration on 11/13/2020.  Fluid cultures obtained showing no growth.  Patient hospital course complicated by right upper extremity DVT secondary to PICC line.  Patient developed worsening respiratory symptoms and increasing oxygen requirements.  He underwent repeat CT chest which showed worsening right-sided pneumonia.  Pulmonary was consulted and he underwent bronchoscopy on 11/17/2020.  Bronchial washings were generally unremarkable.  There was discussion regarding possibly underlying daptomycin pneumonitis.  Daptomycin was discontinued when cultures patient was de-escalated to Bactrim per  ID recommendations.  Patient will follow-up outpatient with ID for ongoing antibiotic management.  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  Patient was discharged home on oxygen.  Will remain on steroids until follow-up with pulmonary on May 17.  He was discharged on Xarelto, given its provoked nature will likely be able to discontinue once imaging is negative in the next 3 to 6 months per protocol.  Patient was discharged on Bactrim and Xanax given profound anxiety in relation to dyspnea.  Patient is aware Xanax is not and should not be permanent fixture or addition to his home medication list and will need follow-up with PCP to manage prescriptions further.   12/01/2020 -Remains on prednisone 68m daily until follow-up and assess when to taper dose -Continues Bactim BID -FOB on 5/3 with negtive pathology, BAL negative -Follow up with Dr. RChase Callerin [facility] clinic in June   Patient reports that he is a little better.  He continues to have some shortness of breath with occasional wheezing.  He has a productive cough which has improved.  Underlying anxiety due to dyspnea symptoms.  He has had no emergencies requiring his albuterol rescue inhaler.  He used this twice since discharge.  He is using flutter valve and incentive spirometer.  He is still on Bactrim and has follow-up with infectious disease in the next week.  Reports difficulty sleeping at night due to steroid.  He has Ambien 5 mg as needed insomnia.  He is using Xanax twice a day as needed for anxiety.  He will follow-up with primary care next Monday. His appetite has improved some. Drinking protein shakes.  Denies fever chills sweats, chest tightness or chest pain.   12/15/2020 - Interim hx  Patient presents  today for acute office visit. He had follow-up CT chest on 12/11/20 that showed small-moderate right pneumothorax, approx 15-20%. Previous right upper lobe  consolidation has improved without residual consolidative components. There is persistent ground glass attenuation and cystic changes throughout RUL and ground glass in RML and lower lobes. Findings may be post infectious or inflammatory versus sequale of interstitial pneumonia. On call provider was notified of critical results. Patient was called ON 12/13/20 and made aware of the results. 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 Patient did report some increase in shortness of breath over the last few days but denies chest pain. Patient opted to wait until Tuesday for follow-up CXR. If pneumothorax still present will need ED evaluation for possible chest tube placement. He has an apt in June with Dr. Chase Caller.   He feels well today. No significant change noted to his breathing. Still able to do some chores indoors. He does not get out of breath at home. He is wearing 2L oxygen. He fell Saturday 5/28, states that he got tripped up with his dog. He hit his face on refrigerator. No LOC or change in mental status. He is on blood thinner. Neuros intact. Bruising to nose.   CXR today showed improvement apical pneumothorax that was seen on previous CT chest, possibly still present on lateral view. Had Dr. Erin Fulling review imaging. Overall reassuring. Plan continue to monitor with repeat CXR on Thursday.   Imaging:  11/23/20 CXR - Persistent right pulmonary opacities and right pleural effusion.Aeration improved compared to 11/17/2020.  12/11/20- CT CHEST WO CONTRAST IMPRESSION: 1. Small to moderate right pneumothorax, approximately 15-20%. This was not present on prior chest imaging, but is not significantly changed from recent thoracic spine radiograph 12/03/2020. 2. Previous right upper lobe consolidation has improved, without residual consolidative components. There is persistent ground-glass attenuation and cystic changes throughout the right upper  lobe, with geographic pulmonary 10 UA shin and ground-glass in the right middle and lower lobes. There is mild associated right lung bronchiectasis. Findings may be due to post infectious or inflammatory sequela, versus sequela of interstitial pneumonia. 3. Improved mediastinal adenopathy from prior exam. 4. Faint ground-glass attenuation in the lingula and anterior left upper lobe was present on prior and mildly improved, likely infectious or inflammatory. 5. Stable pulmonary nodules dating back to June 2021 exam    No Known Allergies  Immunization History  Administered Date(s) Administered  . Fluad Quad(high Dose 65+) 04/05/2019, 06/29/2020  . Influenza Split 04/23/2012  . Influenza Whole 02/16/2011  . Influenza, High Dose Seasonal PF 05/06/2013, 03/25/2016, 05/05/2017, 06/07/2018  . PFIZER(Purple Top)SARS-COV-2 Vaccination 08/23/2019, 09/13/2019  . Pneumococcal Conjugate-13 03/25/2016  . Pneumococcal Polysaccharide-23 01/20/2009, 10/19/2011  . Td 07/09/2003, 07/18/2008  . Tdap 07/20/2011  . Zoster, Live 04/27/2016    Past Medical History:  Diagnosis Date  . Acute pharyngitis 10/21/2013  . Allergy    grass and pollen  . Anxiety and depression 10/25/2011  . BPH (benign prostatic hyperplasia) 04/23/2012  . Chicken pox as a child  . DDD (degenerative disc disease)    cervical responds to steroid injections and low back required surgery  . DDD (degenerative disc disease), lumbosacral   . Diabetes mellitus    pre  . Dyspnea   . ED (erectile dysfunction) 04/23/2012  . Elevated BP   . 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 Esophageal reflux 02/10/2015  . Fatigue   . HTN (hypertension)   . Hyperglycemia    preDM   .  Hyperlipidemia   . Insomnia   . Low back pain radiating to both legs 01/16/2017  . Measles as a child  . Overweight(278.02)   . Peripheral neuropathy 08/07/2019  . Personal history of colonic polyps 10/27/2012   Follows with [facility]  . Pneumonia    "  walking"  . Preventative health care   . 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  . Wears glasses     Tobacco History: Social History   Tobacco Use  Smoking Status Former Smoker  . Packs/day: 1.00  . Years: 20.00  . Pack years: 20.00  . Types: Cigarettes  . Quit date: 07/18/1989  . Years since quitting: 31.4  Smokeless Tobacco Never Used  Tobacco Comment   off and on smoking for the 20 yrs   Counseling given: Not Answered Comment: off and on smoking for the 20 yrs   Outpatient Medications Prior to Visit  Medication Sig Dispense Refill  . ALPRAZolam (XANAX) 0.5 MG tablet Take 1 tablet (0.5 mg total) by mouth 2 (two) times daily as needed for anxiety. 30 tablet 0  . atorvastatin (LIPITOR) 10 MG tablet TAKE 1 TABLET BY MOUTH EVERY DAY 30 tablet 0  . b complex vitamins capsule Take 1 capsule by mouth daily.    Marland Kitchen BAYER ASPIRIN EC LOW DOSE 81 MG EC tablet Take 81 mg by mouth every morning.    . busPIRone (BUSPAR) 7.5 MG tablet 1-2 TAB BY MOUTH 3 TIMES A DAY FOR ANXIETY (Patient taking differently: Take 7.5 mg by mouth 2 (two) times daily.) 540 tablet 1  . cetirizine (ZYRTEC) 10 MG tablet Take 10 mg by mouth daily. In the morning    . dapsone 100 MG tablet Take 1 tablet (100 mg total) by mouth daily. 30 tablet 1  . famotidine (PEPCID) 40 MG tablet TAKE 1 TABLET BY MOUTH EVERYDAY AT BEDTIME (Patient taking differently: Take 40 mg by mouth at bedtime.) 90 tablet 1  . ferrous fumarate (FERRETTS) 325 (106 Fe) MG TABS tablet Take 1 tablet (106 mg of iron total) by mouth daily. 90 tablet 3  . glucose blood (CONTOUR NEXT TEST) test strip Check blood sugar once daily 100 each 12  . JANUVIA 50 MG tablet TAKE 1 TABLET BY MOUTH EVERY DAY 30 tablet 3  . metFORMIN (GLUCOPHAGE) 1000 MG tablet Take 1 tablet (1,000 mg total) by mouth 2 (two) times daily with a meal. 180 tablet 0  . nystatin cream (MYCOSTATIN) Apply 1 application topically 2 (two) times daily. 30 g 1  . olmesartan (BENICAR) 5 MG tablet Take  1 tablet (5 mg total) by mouth daily. 30 tablet 2  . pantoprazole (PROTONIX) 40 MG tablet TAKE 1 TABLET BY MOUTH TWICE A DAY BEFORE A MEAL (Patient taking differently: Take 40 mg by mouth 2 (two) times daily before a meal.) 180 tablet 1  . predniSONE (DELTASONE) 10 MG tablet Take 5 tablets (50 mg total) by mouth daily with breakfast for 7 days, THEN 4 tablets (40 mg total) daily with breakfast for 7 days, THEN 3 tablets (30 mg total) daily with breakfast for 7 days, THEN 2 tablets (20 mg total) daily with breakfast for 7 days, THEN 1 tablet (10 mg total) daily with breakfast for 7 days. 105 tablet 0  . pregabalin (LYRICA) 75 MG capsule TAKE 1 CAPSULE BY MOUTH TWICE A DAY (Patient taking differently: Take 75 mg by mouth 2 (two) times daily.) 60 capsule 3  . PROVENTIL HFA 108 (90 Base) MCG/ACT inhaler TAKE 2  PUFFS BY MOUTH EVERY 6 HOURS AS NEEDED FOR WHEEZE OR SHORTNESS OF BREATH 6.7 each 5  . rivaroxaban (XARELTO) 20 MG TABS tablet Take 1 tablet (20 mg total) by mouth daily with supper. 30 tablet 0  . sodium chloride (OCEAN) 0.65 % SOLN nasal spray Place 1 spray into both nostrils as needed for congestion. 20 mL 0  . sucralfate (CARAFATE) 1 g tablet TAKE 1 TABLET (1 G TOTAL) BY MOUTH 4 (FOUR) TIMES DAILY - WITH MEALS AND AT BEDTIME. 120 tablet 1  . tamsulosin (FLOMAX) 0.4 MG CAPS capsule Take 2 capsules (0.8 mg total) by mouth daily. 180 capsule 3  . testosterone cypionate (DEPOTESTOSTERONE CYPIONATE) 200 MG/ML injection INJECT 1 ML (200 MG TOTAL) INTO THE MUSCLE EVERY 14 (FOURTEEN) DAYS. 2 mL 0  . tiZANidine (ZANAFLEX) 4 MG tablet Take 1.5 tablets (6 mg total) by mouth every 6 (six) hours as needed for muscle spasms. 540 tablet 1  . venlafaxine XR (EFFEXOR-XR) 150 MG 24 hr capsule TAKE 1 CAPSULE (150 MG TOTAL) BY MOUTH DAILY WITH BREAKFAST. 90 capsule 1  . dextromethorphan-guaiFENesin (MUCINEX DM) 30-600 MG 12hr tablet Take 1 tablet by mouth 2 (two) times daily. 40 tablet 0  . HYDROcodone-acetaminophen  (NORCO) 7.5-325 MG tablet Take 1 tablet by mouth every 4 (four) hours as needed for up to 7 days for moderate pain ((score 4 to 6)). 40 tablet 0  . Rivaroxaban (XARELTO) 15 MG TABS tablet Take 1 tablet (15 mg total) by mouth 2 (two) times daily with a meal for 16 days. 32 tablet 0   Facility-Administered Medications Prior to Visit  Medication Dose Route Frequency Provider Last Rate Last Admin  . testosterone cypionate (DEPOTESTOSTERONE CYPIONATE) injection 200 mg  200 mg Intramuscular Q14 Days Saguier, Percell Miller, PA-C   200 mg at 09/11/20 9476      Review of Systems  Review of Systems  Constitutional: Negative.   HENT: Negative.   Respiratory: Positive for wheezing. Negative for cough, chest tightness and shortness of breath.        DOE  Cardiovascular: Negative for chest pain.  Neurological: Negative.      Physical Exam  BP 126/78 (BP Location: Left Arm, Cuff Size: Normal)   Pulse 97   Temp 98 F (36.7 C) (Temporal)   Ht _0  (1.778 m)   Wt 158 lb (71.7 kg)   SpO2 94%   BMI 22.67 kg/m  Physical Exam Constitutional:      General: He is not in acute distress.    Appearance: Normal appearance. He is not ill-appearing.  HENT:     Mouth/Throat:     Mouth: Mucous membranes are moist.     Pharynx: Oropharynx is clear.  Cardiovascular:     Rate and Rhythm: Normal rate and regular rhythm.  Pulmonary:     Effort: Pulmonary effort is normal.     Breath sounds: Normal breath sounds.     Comments: Good air movement. No wheezing or rhonchi. O2 2L POC.  Musculoskeletal:        General: Normal range of motion.  Skin:    General: Skin is warm and dry.  Neurological:     General: No focal deficit present.     Mental Status: He is alert and oriented to person, place, and time. Mental status is at baseline.  Psychiatric:        Mood and Affect: Mood normal.        Behavior: Behavior normal.  Thought Content: Thought content normal.        Judgment: Judgment normal.       Lab Results:  CBC    Component Value Date/Time   WBC 11.8 (H) 12/04/2020 1523   RBC 5.65 12/04/2020 1523   HGB 16.4 12/04/2020 1523   HGB 12.8 (L) 11/18/2020 1608   HCT 50.9 (H) 12/04/2020 1523   PLT 346 12/04/2020 1523   MCV 90.1 12/04/2020 1523   MCH 29.0 12/04/2020 1523   MCHC 32.2 12/04/2020 1523   RDW 14.7 12/04/2020 1523   LYMPHSABS 425 (L) 12/04/2020 1523   MONOABS 0.6 11/21/2020 0451   EOSABS 12 (L) 12/04/2020 1523   BASOSABS 24 12/04/2020 1523    BMET    Component Value Date/Time   NA 121 (L) 12/09/2020 1410   K 5.0 12/09/2020 1410   CL 83 (L) 12/09/2020 1410   CO2 29 12/09/2020 1410   GLUCOSE 378 (H) 12/09/2020 1410   BUN 18 12/09/2020 1410   CREATININE 0.75 12/09/2020 1410   CALCIUM 9.5 12/09/2020 1410   GFRNONAA 84 12/04/2020 1523   GFRAA 98 12/04/2020 1523    BNP    Component Value Date/Time   BNP 68.6 11/10/2020 1654    ProBNP No results found for: PROBNP  Imaging: DG Chest 2 View  Result Date: 12/15/2020 CLINICAL DATA:  Pneumonia EXAM: CHEST - 2 VIEW COMPARISON:  11/23/2020 FINDINGS: Significant resolution of the right lung airspace disease seen previously. The right hemithorax described on recent CTs not conspicuous. No new infiltrate or overt edema. Heart size normal. No effusion. Thoracolumbar fixation hardware, incompletely visualized distally. IMPRESSION: Resolution of right lung airspace opacities since prior radiograph Electronically Signed   By: Lucrezia Europe M.D.   On: 12/15/2020 13:53   DG Chest 2 View  Result Date: 11/23/2020 CLINICAL DATA:  Pneumonia EXAM: CHEST - 2 VIEW COMPARISON:  11/17/2020 FINDINGS: Persistent right pulmonary opacities and right pleural effusion. Aeration is improved relative to prior study. Left lung is clear apart from some scarring at the lung base. No pneumothorax. Similar cardiomediastinal contours. IMPRESSION: Persistent right pulmonary opacities and right pleural effusion. Aeration improved compared to  11/17/2020. Electronically Signed   By: Macy Mis M.D.   On: 11/23/2020 09:28   CT Chest Wo Contrast  Result Date: 12/13/2020 CLINICAL DATA:  Drug induced pneumonitis. Pneumonia. Shortness of breath and fatigue for 6 weeks. EXAM: CT CHEST WITHOUT CONTRAST TECHNIQUE: Multidetector CT imaging of the chest was performed following the standard protocol without IV contrast. COMPARISON:  Chest CT 11/15/2020, chest radiograph 11/23/2020. Thoracic spine radiograph 12/03/2020 reviewed. Remote chest CTs reviewed. FINDINGS: Cardiovascular: Aortic atherosclerosis. No aortic aneurysm. Normal caliber main pulmonary artery. There are coronary artery calcifications. Heart is normal in size. Trace pericardial fluid/thickening. Mediastinum/Nodes: Improved mediastinal adenopathy from prior exam. Previous precarinal/anterior paratracheal node currently measures 7 mm short axis, previously 13 mm. Subcarinal node measures 9 mm short axis previously 13 mm. Multiple additional small mediastinal lymph nodes have improved or resolved. Slightly patulous esophagus without wall thickening. No visualized thyroid nodule. Lungs/Pleura: Small to moderate size pneumothorax,, approximately 15-20%. Previous consolidation throughout the right upper lobe has improved, without residual consolidative components. There is persistent ground-glass attenuation and cystic changes throughout the right upper lobe. Geographic parenchymal attenuation of the right middle and lower lobe with areas of ground-glass. There is mild associated right lung bronchiectasis. The previous right pleural effusion has resolved. There is right lung volume loss that is progressive. 5 mm subpleural nodule in the  right lower lobe series 8, image 90, previously obscured, but present on 12/25/2019 exam and stable. 6 mm perifissural nodule in the right lower lobe, series 8, image 68, unchanged. 5 mm subpleural nodule in the left lower lobe, series 8, image 96, unchanged. There  is faint ground-glass attenuation involving the lingula and anterior left upper lobe, present on prior and mildly improved. Mild underlying emphysema. Upper Abdomen: No acute or unexpected findings. Musculoskeletal: Extensive thoracolumbar fusion hardware. Abnormal appearance of T4-T5 with sclerosis and disc space loss, ghost tracks from prior hardware. Unchanged from prior exam. Stable sclerosis of the costovertebral junction of the bilateral fifth ribs. There are no acute osseous abnormalities. IMPRESSION: 1. Small to moderate right pneumothorax, approximately 15-20%. This was not present on prior chest imaging, but is not significantly changed from recent thoracic spine radiograph 12/03/2020. 2. Previous right upper lobe consolidation has improved, without residual consolidative components. There is persistent ground-glass attenuation and cystic changes throughout the right upper lobe, with geographic pulmonary 10 UA shin and ground-glass in the right middle and lower lobes. There is mild associated right lung bronchiectasis. Findings may be due to post infectious or inflammatory sequela, versus sequela of interstitial pneumonia. 3. Improved mediastinal adenopathy from prior exam. 4. Faint ground-glass attenuation in the lingula and anterior left upper lobe was present on prior and mildly improved, likely infectious or inflammatory. 5. Stable pulmonary nodules dating back to June 2021 exam. Aortic Atherosclerosis (ICD10-I70.0) and Emphysema (ICD10-J43.9). These results will be called to the ordering clinician or representative by the Radiologist Assistant, and communication documented in the PACS or [employer]. Electronically Signed   By: Keith Rake M.D.   On: 12/13/2020 13:39   DG CHEST PORT 1 VIEW  Result Date: 11/17/2020 CLINICAL DATA:  Status post bronchoscopy EXAM: PORTABLE CHEST 1 VIEW COMPARISON:  November 10, 2020 chest radiograph; chest CT Nov 15, 2020 FINDINGS: Central catheter no longer  present. Widespread airspace opacity persists throughout the right lung. Small right pleural effusion. Airspace consolidation noted throughout portions of the right mid and lower lung regions as well as right apex. Left lung is clear. Heart size is normal. Pulmonary vascularity within normal limits. No adenopathy is appreciable by radiography. Postoperative changes noted in much of the mid and lower thoracic and visualized upper lumbar spine regions. IMPRESSION: Extensive airspace opacity throughout the right lung with areas of consolidation in the right apex as well as in the right lower lung region. Small right pleural effusion. Left lung clear. Stable cardiac silhouette. No evident pneumothorax. Electronically Signed   By: Lowella Grip III M.D.   On: 11/17/2020 14:26   XR Thoracic Spine 2 View  Result Date: 12/03/2020 AP and lateral flexion and extension   VAS [place] UPPER EXTREMITY VENOUS DUPLEX  Result Date: 11/16/2020 UPPER VENOUS STUDY  Patient Name:  RAYN ENDERSON  Date of Exam:   11/16/2020 Medical Rec #: [id number]         Accession #:    [id number] Date of Birth: March 02, 1946          Patient Gender: M Patient Age:   75Y Exam Location:  [facility] Procedure:      VAS [place] UPPER EXTREMITY VENOUS DUPLEX Referring Phys: JAARS --------------------------------------------------------------------------------  Indications: Swelling Risk Factors: Trauma PICC line. Limitations: Bandages and line. Comparison Study: No prior studies. Performing Technologist: Oliver Hum RVT  Examination Guidelines: A complete evaluation includes B-mode imaging, spectral Doppler, color Doppler, and power Doppler as needed of all  accessible portions of each vessel. Bilateral testing is considered an integral part of a complete examination. Limited examinations for reoccurring indications may be performed as noted.  Right Findings: +----------+------------+---------+-----------+----------+-------+ RIGHT      CompressiblePhasicitySpontaneousPropertiesSummary +----------+------------+---------+-----------+----------+-------+ IJV           Full       Yes       Yes                      +----------+------------+---------+-----------+----------+-------+ Subclavian    Full       Yes       Yes                      +----------+------------+---------+-----------+----------+-------+ Axillary      None       No        No                Acute  +----------+------------+---------+-----------+----------+-------+ Brachial    Partial      No        No                Acute  +----------+------------+---------+-----------+----------+-------+ Radial        Full                                          +----------+------------+---------+-----------+----------+-------+ Ulnar         Full                                          +----------+------------+---------+-----------+----------+-------+ Cephalic      Full                                          +----------+------------+---------+-----------+----------+-------+ Basilic       Full                                          +----------+------------+---------+-----------+----------+-------+  Left Findings: +----------+------------+---------+-----------+----------+-------+ LEFT      CompressiblePhasicitySpontaneousPropertiesSummary +----------+------------+---------+-----------+----------+-------+ Subclavian    Full       Yes       Yes                      +----------+------------+---------+-----------+----------+-------+  Summary:  Right: No evidence of superficial vein thrombosis in the upper extremity. Findings consistent with acute deep vein thrombosis involving the right axillary vein and right brachial veins.  Left: No evidence of thrombosis in the subclavian.  *See table(s) above for measurements and observations.  Diagnosing physician: Ruta Hinds MD Electronically signed by Ruta Hinds MD on 11/16/2020  at 2:34:45 PM.    Final    DG C-ARM BRONCHOSCOPY  Result Date: 11/17/2020 C-ARM BRONCHOSCOPY: Fluoroscopy was utilized by the requesting physician.  No radiographic interpretation.     Assessment & Plan:   Pneumothorax, right - Patient remains relatively asymptomatic. Denies chest tightness/pain, cough or dizziness. CXR today showed resolution of right lung airspace opacities since prior radiograph. Overall improvement apical pneumothorax that was seen on previous CT chest 12/11/20, possibly still present  on lateral view. Imaging reviewed by Dr. Erin Fulling and myself. Plan repeat CXR on Thursday to monitor. If he develop acute shortness of breath, chest pain or dizziness advised to prsent to ED. Continue to wear oxygen 2L continuous for now.   Drug-induced pneumonitis - Continues to improve. He experiences slight dyspnea with moderate activity and some upper airway wheezing - Tapering prednisone by 19m every week. Currently on 363mdaily -  Following with ID.  - He has an appointment with Dr. RaChase Callercheduled for June 14th.    ElMartyn EhrichNP 12/15/2020

## 2020-12-15 NOTE — Assessment & Plan Note (Addendum)
-   Patient remains relatively asymptomatic. Denies chest tightness/pain, cough or dizziness. CXR today showed resolution of right lung airspace opacities since prior radiograph. Overall improvement apical pneumothorax that was seen on previous CT chest 12/11/20, possibly still present on lateral view. Imaging reviewed by Dr. Erin Fulling and myself. Plan repeat CXR on Thursday to monitor. If he develop acute shortness of breath, chest pain or dizziness advised to prsent to ED. Continue to wear oxygen 2L continuous for now.

## 2020-12-16 ENCOUNTER — Encounter: Payer: Self-pay | Admitting: Medical

## 2020-12-17 ENCOUNTER — Telehealth: Payer: Self-pay | Admitting: Medical

## 2020-12-17 ENCOUNTER — Ambulatory Visit (INDEPENDENT_AMBULATORY_CARE_PROVIDER_SITE_OTHER): Payer: Medicare Other

## 2020-12-17 ENCOUNTER — Encounter: Payer: Self-pay | Admitting: Medical

## 2020-12-17 DIAGNOSIS — J189 Pneumonia, unspecified organism: Secondary | ICD-10-CM | POA: Diagnosis not present

## 2020-12-17 DIAGNOSIS — J939 Pneumothorax, unspecified: Secondary | ICD-10-CM | POA: Diagnosis not present

## 2020-12-17 DIAGNOSIS — T50905A Adverse effect of unspecified drugs, medicaments and biological substances, initial encounter: Secondary | ICD-10-CM

## 2020-12-17 LAB — FUNGUS CULTURE WITH STAIN

## 2020-12-17 LAB — FUNGUS CULTURE RESULT

## 2020-12-17 LAB — FUNGAL ORGANISM REFLEX

## 2020-12-17 MED ORDER — TERBINAFINE HCL 250 MG PO TABS: 250.0000 mg | ORAL_TABLET | Freq: Every day | ORAL | 0 refills | Status: DC

## 2020-12-17 NOTE — Telephone Encounter (Signed)
Lamisil rx sent to pharmacy.

## 2020-12-18 NOTE — Telephone Encounter (Signed)
Activity! Should I now start back with rehab?I had two visits a week before this last problem. Just wanted to know my limits for now! Thank you so much!  Beth, please advise. Thanks

## 2020-12-18 NOTE — Progress Notes (Signed)
Please let patient know previously seen right pneumothorax by CT was not visualized on CXR. If his symptoms worsen between now and his apt with Dr. Chase Caller on 12/29/20 please notify office or present to ED

## 2020-12-18 NOTE — Telephone Encounter (Signed)
Yes he can start back with rehab. Would avoid strenuous yard work however

## 2020-12-20 ENCOUNTER — Other Ambulatory Visit: Payer: Self-pay | Admitting: Medical

## 2020-12-21 ENCOUNTER — Ambulatory Visit (INDEPENDENT_AMBULATORY_CARE_PROVIDER_SITE_OTHER): Payer: Medicare Other | Admitting: Medical

## 2020-12-21 ENCOUNTER — Other Ambulatory Visit: Payer: Self-pay

## 2020-12-21 VITALS — BP 100/58 | HR 101 | Temp 98.5°F | Resp 20 | Ht 70.0 in | Wt 154.8 lb

## 2020-12-21 DIAGNOSIS — R5383 Other fatigue: Secondary | ICD-10-CM | POA: Diagnosis not present

## 2020-12-21 DIAGNOSIS — J8281 Chronic eosinophilic pneumonia: Secondary | ICD-10-CM

## 2020-12-21 DIAGNOSIS — J939 Pneumothorax, unspecified: Secondary | ICD-10-CM | POA: Diagnosis not present

## 2020-12-21 DIAGNOSIS — R040 Epistaxis: Secondary | ICD-10-CM

## 2020-12-21 DIAGNOSIS — B49 Unspecified mycosis: Secondary | ICD-10-CM | POA: Diagnosis not present

## 2020-12-21 NOTE — Progress Notes (Addendum)
Subjective:    [name], male    DOB: 1945-08-26, 75 y.o.   MRN: [id number]  HPI  Pt in for follow up.  Pt has appt on December 29, 2020 with pulmonologist. Pt had small pneumothorax and hx of eosonophilic pneumonia. Pt will be on prednisone 20 mg daily for 10 days then will be on 10 mg daily.  Pt has groin rash. Pt has been on nystatin initially. Pt has only been on 3 days of lamisil tabs.    Pt is having some nose bleeds with use of oxygen. He was given xarelto. Pt had blood clot on pic line. He states will stop that in one week.  Pt still having some fatigue.  Pt has diabetes. His sugars have been higher when he was on higher dosages of prednisone. Recently sugars 110 day before yesterday. Pt on metformin and januvia.  Anxious about health. Pt doing well with anxiety. Recent anxiety over health.  Review of Systems  Constitutional: Negative for chills, fatigue and fever.  Respiratory: Negative for cough, chest tightness, shortness of breath and wheezing.   Cardiovascular: Negative for chest pain and palpitations.  Gastrointestinal: Negative for abdominal pain.  Musculoskeletal: Negative for back pain.  Neurological: Negative for dizziness, speech difficulty, weakness, numbness and headaches.  Hematological: Negative for adenopathy. Does not bruise/bleed easily.  Psychiatric/Behavioral: Negative for behavioral problems, self-injury and suicidal ideas. The patient is nervous/anxious.     Past Medical History:  Diagnosis Date  . Acute pharyngitis 10/21/2013  . Allergy    grass and pollen  . Anxiety and depression 10/25/2011  . BPH (benign prostatic hyperplasia) 04/23/2012  . Chicken pox as a child  . DDD (degenerative disc disease)    cervical responds to steroid injections and low back required surgery  . DDD (degenerative disc disease), lumbosacral   . Diabetes mellitus    pre  . Dyspnea   . ED (erectile dysfunction) 04/23/2012  . Elevated BP   . Epidural  abscess 10/15/2019  . Esophageal reflux 02/10/2015  . Fatigue   . HTN (hypertension)   . Hyperglycemia    preDM   . Hyperlipidemia   . Insomnia   . Low back pain radiating to both legs 01/16/2017  . Measles as a child  . Overweight(278.02)   . Peripheral neuropathy 08/07/2019  . Personal history of colonic polyps 10/27/2012   Follows [facility]  . Pneumonia    " walking"  . Preventative health care   . 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  . Wears glasses      Social History   Socioeconomic History  . Marital status: Married    Spouse name: Not on file  . Number of children: Not on file  . Years of education: Not on file  . Highest education level: Not on file  Occupational History  . Not on file  Tobacco Use  . Smoking status: Former Smoker    Packs/day: 1.00    Years: 20.00    Pack years: 20.00    Types: Cigarettes    Quit date: 07/18/1989    Years since quitting: 31.4  . Smokeless tobacco: Never Used  . Tobacco comment: off and on smoking for the 20 yrs  Vaping Use  . Vaping Use: Never used  Substance and Sexual Activity  . Alcohol use: Not Currently    Comment: drink or two a day- mixed drink  . Drug use: No  . Sexual activity: Yes    Comment:  lives with wife, still working, no dietary restrictions, continues to exercise intermittently  Other Topics Concern  . Not on file  Social History Narrative  . Not on file   Social Determinants of Health   Financial Resource Strain: Not on file  Food Insecurity: Not on file  Transportation Needs: Not on file  Physical Activity: Not on file  Stress: Not on file  Social Connections: Not on file  Intimate Partner Violence: Not on file    Past Surgical History:  Procedure Laterality Date  . BACK SURGERY  2012 and 1994   Dr Margret Chance, screws and cage in low back  . BIOPSY  01/09/2020   Procedure: BIOPSY;  Surgeon: Otis Brace, MD;  Location: [facility];  Service: Gastroenterology;;  . BRONCHIAL  BIOPSY  11/17/2020   Procedure: BRONCHIAL BIOPSIES;  Surgeon: Laurin Coder, MD;  Location: [facility];  Service: Endoscopy;;  . BRONCHIAL BRUSHINGS  11/17/2020   Procedure: BRONCHIAL BRUSHINGS;  Surgeon: Laurin Coder, MD;  Location: [facility];  Service: Endoscopy;;  . BRONCHIAL WASHINGS  11/17/2020   Procedure: BRONCHIAL WASHINGS;  Surgeon: Laurin Coder, MD;  Location: [facility];  Service: Endoscopy;;  . COLONOSCOPY WITH PROPOFOL N/A 01/09/2020   Procedure: COLONOSCOPY WITH PROPOFOL;  Surgeon: Otis Brace, MD;  Location: [facility];  Service: Gastroenterology;  Laterality: N/A;  . ESOPHAGOGASTRODUODENOSCOPY (EGD) WITH PROPOFOL N/A 01/09/2020   Procedure: ESOPHAGOGASTRODUODENOSCOPY (EGD) WITH PROPOFOL;  Surgeon: Otis Brace, MD;  Location: [facility];  Service: Gastroenterology;  Laterality: N/A;  . EYE SURGERY Bilateral    2016  . HARDWARE REVISION  03/12/2019   REVISION OF HARDWARE THORACIC TEN-THORACIC ELEVEN WITH APPLICATION OF ADDITIONAL RODS AND ROD SLEEVES THORACIC EIGHT-THORACIC TWELVE 12-LUMBAR ONE (N/A )  . HEMOSTASIS CONTROL  11/17/2020   Procedure: HEMOSTASIS CONTROL;  Surgeon: Laurin Coder, MD;  Location: [facility];  Service: Endoscopy;;  . IR US GUIDE BX ASP/DRAIN  11/13/2020  . LAMINECTOMY  02/12/2019   DECOMPRESSIVE LAMINECTOMY THORACIC NINE-THORACIC TEN AND THORACIC TEN-THORACIC ELEVEN, EXTENSION OF THORACOLUMBAR FUSION FROM THORACIC TEN TO THORACIC FIVE, LOCAL BONE GRAFT, ALLOGRAFT AND VIVIGEN (N/A)  . POLYPECTOMY  01/09/2020   Procedure: POLYPECTOMY;  Surgeon: Otis Brace, MD;  Location: [facility];  Service: Gastroenterology;;  . REMOVAL OF RODS AND PEDICLE SCREWS T5 AND T6, EXPLORATION OF FUSION, BIOPSY TRANSPEDICULAR T5 AND T6 (N/A )  09/14/2020  . SPINAL FUSION N/A 02/12/2019   Procedure: DECOMPRESSIVE LAMINECTOMY THORACIC NINE-THORACIC TEN AND THORACIC TEN-THORACIC ELEVEN, EXTENSION OF THORACOLUMBAR FUSION FROM THORACIC TEN  TO THORACIC FIVE, LOCAL BONE GRAFT, ALLOGRAFT AND VIVIGEN;  Surgeon: Jessy Oto, MD;  Location: [place];  Service: Orthopedics;  Laterality: N/A;  DECOMPRESSIVE LAMINECTOMY THORACIC NINE-THORACIC TEN AND THORACIC TEN-THORACIC ELEVEN, EXTENSION OF TH  . SPINAL FUSION N/A 03/12/2019   Procedure: REVISION OF HARDWARE THORACIC TEN-THORACIC ELEVEN WITH APPLICATION OF ADDITIONAL RODS AND ROD SLEEVES THORACIC EIGHT-THORACIC TWELVE 12-LUMBAR ONE;  Surgeon: Jessy Oto, MD;  Location: [place];  Service: Orthopedics;  Laterality: N/A;  . TONSILLECTOMY     as child  . torn rotator cuff  2010   right  . VIDEO BRONCHOSCOPY N/A 11/17/2020   Procedure: VIDEO BRONCHOSCOPY WITH FLUORO;  Surgeon: Laurin Coder, MD;  Location: [facility];  Service: Endoscopy;  Laterality: N/A;    Family History  Problem Relation Age of Onset  . Hypertension Mother   . Diabetes Mother        type 2  . Cancer Mother 46  breast in remission  . Emphysema Father        smoker  . COPD Father        smoker  . Stroke Father 69       mini  . Heart disease Father   . Hypertension Sister   . Hyperlipidemia Sister   . Scoliosis Sister   . Osteoporosis Sister   . Hypertension Maternal Grandmother   . Scoliosis Maternal Grandmother   . Heart disease Maternal Grandfather   . Heart disease Paternal Grandfather        smoker  . Heart disease Daughter     No Known Allergies  Current Outpatient Medications on File Prior to Visit  Medication Sig Dispense Refill  . ALPRAZolam (XANAX) 0.5 MG tablet Take 1 tablet (0.5 mg total) by mouth 2 (two) times daily as needed for anxiety. 30 tablet 0  . atorvastatin (LIPITOR) 10 MG tablet TAKE 1 TABLET BY MOUTH EVERY DAY 30 tablet 0  . b complex vitamins capsule Take 1 capsule by mouth daily.    Marland Kitchen BAYER ASPIRIN EC LOW DOSE 81 MG EC tablet Take 81 mg by mouth every morning.    . busPIRone (BUSPAR) 7.5 MG tablet 1-2 TAB BY MOUTH 3 TIMES A DAY FOR ANXIETY (Patient taking  differently: Take 7.5 mg by mouth 2 (two) times daily.) 540 tablet 1  . cetirizine (ZYRTEC) 10 MG tablet Take 10 mg by mouth daily. In the morning    . dapsone 100 MG tablet Take 1 tablet (100 mg total) by mouth daily. 30 tablet 1  . famotidine (PEPCID) 40 MG tablet TAKE 1 TABLET BY MOUTH EVERYDAY AT BEDTIME (Patient taking differently: Take 40 mg by mouth at bedtime.) 90 tablet 1  . ferrous fumarate (FERRETTS) 325 (106 Fe) MG TABS tablet Take 1 tablet (106 mg of iron total) by mouth daily. 90 tablet 3  . glucose blood (CONTOUR NEXT TEST) test strip Check blood sugar once daily 100 each 12  . JANUVIA 50 MG tablet TAKE 1 TABLET BY MOUTH EVERY DAY 30 tablet 3  . metFORMIN (GLUCOPHAGE) 1000 MG tablet Take 1 tablet (1,000 mg total) by mouth 2 (two) times daily with a meal. 180 tablet 0  . nystatin cream (MYCOSTATIN) Apply 1 application topically 2 (two) times daily. 30 g 1  . olmesartan (BENICAR) 5 MG tablet Take 1 tablet (5 mg total) by mouth daily. 30 tablet 2  . pantoprazole (PROTONIX) 40 MG tablet TAKE 1 TABLET BY MOUTH TWICE A DAY BEFORE A MEAL 180 tablet 1  . predniSONE (DELTASONE) 10 MG tablet Take 5 tablets (50 mg total) by mouth daily with breakfast for 7 days, THEN 4 tablets (40 mg total) daily with breakfast for 7 days, THEN 3 tablets (30 mg total) daily with breakfast for 7 days, THEN 2 tablets (20 mg total) daily with breakfast for 7 days, THEN 1 tablet (10 mg total) daily with breakfast for 7 days. 105 tablet 0  . pregabalin (LYRICA) 75 MG capsule TAKE 1 CAPSULE BY MOUTH TWICE A DAY (Patient taking differently: Take 75 mg by mouth 2 (two) times daily.) 60 capsule 3  . PROVENTIL HFA 108 (90 Base) MCG/ACT inhaler TAKE 2 PUFFS BY MOUTH EVERY 6 HOURS AS NEEDED FOR WHEEZE OR SHORTNESS OF BREATH 6.7 each 5  . rivaroxaban (XARELTO) 20 MG TABS tablet Take 1 tablet (20 mg total) by mouth daily with supper. 30 tablet 0  . sodium chloride (OCEAN) 0.65 % SOLN nasal spray Place  1 spray into both nostrils  as needed for congestion. 20 mL 0  . sucralfate (CARAFATE) 1 g tablet TAKE 1 TABLET (1 G TOTAL) BY MOUTH 4 (FOUR) TIMES DAILY - WITH MEALS AND AT BEDTIME. 120 tablet 1  . tamsulosin (FLOMAX) 0.4 MG CAPS capsule Take 2 capsules (0.8 mg total) by mouth daily. 180 capsule 3  . terbinafine (LAMISIL) 250 MG tablet Take 1 tablet (250 mg total) by mouth daily. 3 tablet 0  . testosterone cypionate (DEPOTESTOSTERONE CYPIONATE) 200 MG/ML injection INJECT 1 ML (200 MG TOTAL) INTO THE MUSCLE EVERY 14 (FOURTEEN) DAYS. 2 mL 0  . tiZANidine (ZANAFLEX) 4 MG tablet Take 1.5 tablets (6 mg total) by mouth every 6 (six) hours as needed for muscle spasms. 540 tablet 1  . venlafaxine XR (EFFEXOR-XR) 150 MG 24 hr capsule TAKE 1 CAPSULE (150 MG TOTAL) BY MOUTH DAILY WITH BREAKFAST. 90 capsule 1  . HYDROcodone-acetaminophen (NORCO) 7.5-325 MG tablet Take 1 tablet by mouth every 4 (four) hours as needed for up to 7 days for moderate pain ((score 4 to 6)). 40 tablet 0   Current Facility-Administered Medications on File Prior to Visit  Medication Dose Route Frequency Provider Last Rate Last Admin  . testosterone cypionate (DEPOTESTOSTERONE CYPIONATE) injection 200 mg  200 mg Intramuscular Q14 Days Carmisha Larusso, Percell Miller, PA-C   200 mg at 09/11/20 0951    BP 100/70   Pulse (!) 101   Temp 98.5 F (36.9 C)   Resp 20   Ht 5\' 10"  (1.778 m)   Wt 154 lb 12.8 oz (70.2 kg)   SpO2 94%   BMI 22.21 kg/m       Objective:   Physical Exam  General Mental Status- Alert. General Appearance- Not in acute distress.   Skin General: Color- Normal Color. Moisture- Normal Moisture.  Neck Carotid Arteries- Normal color. Moisture- Normal Moisture. No carotid bruits. No JVD.  Chest and Lung Exam Auscultation: Breath Sounds:-Normal.  Cardiovascular Auscultation:Rythm- Regular. Murmurs & Other Heart Sounds:Auscultation of the heart reveals- No Murmurs.  Abdomen Inspection:-Inspeection Normal. Palpation/Percussion:Note:No mass.  Palpation and Percussion of the abdomen reveal- Non Tender, Non Distended + BS, no rebound or guarding.    Neurologic Cranial Nerve exam:- CN III-XII intact(No nystagmus), symmetric smile.  Strength:- 5/5 equal and symmetric strength both upper and lower extremities.  Derm- slight red inflammed appearance to scrotum.      Assessment & Plan:  Hx of eosinophilic pneumonia and recent small pneumothorax. 94%. At home 93%. Check at home if go below 90% increase3 L. Continue to follow up with pulmonlogist.  For fungal infection groin continue on nystatin cream and plan to give additional 7 days lamisil tabs daly after review of cmp.  For fatigue will get cbc, iron, b12 and b1  For epistaxis use vaseline to anterior tip of nose medial aspect. If nose bleed reoccurs can use afrin you need.(limited use). Still on xarelto maybe playing a role in nose bleeds along with oxygen use. Follow platelets on cbc.  Follow up 2-3 weeks or as needed  [employer], PA-C

## 2020-12-21 NOTE — Patient Instructions (Addendum)
Hx of eosinophilic pneumonia and recent small pneumothorax. 94%. At home 93%. Check at home if go below 90% increase3 L. Continue to follow up with pulmonlogist.  For fungal infection groin continue on nystatin cream and plan to give additional 7 days lamisil tabs daly after review of cmp.  For fatigue will get cbc, iron, b12 and b1  For epistaxis use vaseline to anterior tip of nose medial aspect. If nose bleed reoccurs can use afrin you need.(limited use). Still on xarelto maybe playing a role in nose bleeds along with oxygen use. Follow platelets on cbc.  Follow up 2-3 weeks or as needed

## 2020-12-22 ENCOUNTER — Telehealth: Payer: Self-pay | Admitting: Medical

## 2020-12-22 ENCOUNTER — Encounter: Payer: Self-pay | Admitting: Medical

## 2020-12-22 LAB — CBC WITH DIFFERENTIAL/PLATELET
Basophils Absolute: 0 10*3/uL (ref 0.0–0.1)
Basophils Relative: 0.3 % (ref 0.0–3.0)
Eosinophils Absolute: 0 10*3/uL (ref 0.0–0.7)
Eosinophils Relative: 0.3 % (ref 0.0–5.0)
HCT: 39.3 % (ref 39.0–52.0)
Hemoglobin: 13 g/dL (ref 13.0–17.0)
Lymphocytes Relative: 3.2 % — ABNORMAL LOW (ref 12.0–46.0)
Lymphs Abs: 0.3 10*3/uL — ABNORMAL LOW (ref 0.7–4.0)
MCHC: 32.9 g/dL (ref 30.0–36.0)
MCV: 91.1 fl (ref 78.0–100.0)
Monocytes Absolute: 0.6 10*3/uL (ref 0.1–1.0)
Monocytes Relative: 5.9 % (ref 3.0–12.0)
Neutro Abs: 8.5 10*3/uL — ABNORMAL HIGH (ref 1.4–7.7)
Neutrophils Relative %: 90.3 % — ABNORMAL HIGH (ref 43.0–77.0)
Platelets: 287 10*3/uL (ref 150.0–400.0)
RBC: 4.32 Mil/uL (ref 4.22–5.81)
RDW: 17.3 % — ABNORMAL HIGH (ref 11.5–15.5)
WBC: 9.5 10*3/uL (ref 4.0–10.5)

## 2020-12-22 LAB — COMPREHENSIVE METABOLIC PANEL
ALT: 7 U/L (ref 0–53)
AST: 9 U/L (ref 0–37)
Albumin: 4 g/dL (ref 3.5–5.2)
Alkaline Phosphatase: 59 U/L (ref 39–117)
BUN: 15 mg/dL (ref 6–23)
CO2: 28 mEq/L (ref 19–32)
Calcium: 9.1 mg/dL (ref 8.4–10.5)
Chloride: 89 mEq/L — ABNORMAL LOW (ref 96–112)
Creatinine, Ser: 0.67 mg/dL (ref 0.40–1.50)
GFR: 90.17 mL/min (ref >60.00)
Glucose, Bld: 243 mg/dL — ABNORMAL HIGH (ref 70–99)
Potassium: 4.9 mEq/L (ref 3.5–5.1)
Sodium: 129 mEq/L — ABNORMAL LOW (ref 135–145)
Total Bilirubin: 1.6 mg/dL — ABNORMAL HIGH (ref 0.2–1.2)
Total Protein: 5.7 g/dL — ABNORMAL LOW (ref 6.0–8.3)

## 2020-12-22 LAB — VITAMIN B12: Vitamin B-12: 190 pg/mL — ABNORMAL LOW (ref 211–911)

## 2020-12-22 LAB — IRON: Iron: 88 ug/dL (ref 42–165)

## 2020-12-22 MED ORDER — TERBINAFINE HCL 250 MG PO TABS: 250.0000 mg | ORAL_TABLET | Freq: Every day | ORAL | 0 refills | Status: DC

## 2020-12-22 NOTE — Telephone Encounter (Signed)
Pt states he is very week , tired and oxygen at 3

## 2020-12-22 NOTE — Telephone Encounter (Signed)
RX lamasil sent to pt pharmacy.

## 2020-12-23 ENCOUNTER — Telehealth: Payer: Self-pay | Admitting: Medical

## 2020-12-23 DIAGNOSIS — R5383 Other fatigue: Secondary | ICD-10-CM

## 2020-12-23 NOTE — Telephone Encounter (Signed)
Future labs placed. 

## 2020-12-23 NOTE — Telephone Encounter (Signed)
Dr. Erin Fulling, please see mychart message thread and advise. Especially see the most recent one that pt just sent 6/7.

## 2020-12-23 NOTE — Telephone Encounter (Signed)
Dr.Dewald made suggestions of getting patient's thyroid checked & Vitamin D levels -- myhcart message from 12/21/20

## 2020-12-24 ENCOUNTER — Other Ambulatory Visit: Payer: Self-pay

## 2020-12-24 ENCOUNTER — Ambulatory Visit (INDEPENDENT_AMBULATORY_CARE_PROVIDER_SITE_OTHER): Payer: Medicare Other

## 2020-12-24 DIAGNOSIS — E538 Deficiency of other specified B group vitamins: Secondary | ICD-10-CM | POA: Diagnosis not present

## 2020-12-24 DIAGNOSIS — J9601 Acute respiratory failure with hypoxia: Secondary | ICD-10-CM | POA: Diagnosis not present

## 2020-12-24 DIAGNOSIS — R0602 Shortness of breath: Secondary | ICD-10-CM | POA: Diagnosis not present

## 2020-12-24 MED ORDER — CYANOCOBALAMIN 1000 MCG/ML IJ SOLN
1000.0000 ug | Freq: Once | INTRAMUSCULAR | Status: AC
  Administered 2020-12-24: 1000 ug via INTRAMUSCULAR

## 2020-12-24 NOTE — Progress Notes (Addendum)
Luis Brown is a 75 y.o. male presents to the office today for B12, injection, per physician's orders. Original order: for B12 on lab results from 12-22-2020 to start B12 shots once a week for one month and once a month for 5 months. Cyanocovalamin (med), 1000mg /ml (dose),  IM (route) was administered left arm (location) today.   Patient also here for Testosterone cypionatre injection IM. He gets this everry 14 to 16 days. Last one given during office visit on 12-07-2020. Patient provided medication which exp date was 08-19-2023.  Injection administered IM.   Patient tolerated both injections well.  Patient next iB12 njection due: in one week for B12 #2 of 4 weekly, appt made for 12-31-20.  Next testosterone injection due in 2 weeks, scheduled for 01-07-21 to be done with B12 #3.   Jolene Provost Rennis Petty, PA-C

## 2020-12-25 ENCOUNTER — Telehealth: Payer: Self-pay | Admitting: Medical

## 2020-12-25 ENCOUNTER — Encounter: Payer: Self-pay | Admitting: Medical

## 2020-12-25 ENCOUNTER — Other Ambulatory Visit: Payer: Self-pay | Admitting: Medical

## 2020-12-25 DIAGNOSIS — I10 Essential (primary) hypertension: Secondary | ICD-10-CM | POA: Diagnosis not present

## 2020-12-25 DIAGNOSIS — G4700 Insomnia, unspecified: Secondary | ICD-10-CM | POA: Diagnosis not present

## 2020-12-25 DIAGNOSIS — E785 Hyperlipidemia, unspecified: Secondary | ICD-10-CM | POA: Diagnosis not present

## 2020-12-25 DIAGNOSIS — Z7984 Long term (current) use of oral hypoglycemic drugs: Secondary | ICD-10-CM | POA: Diagnosis not present

## 2020-12-25 DIAGNOSIS — E1142 Type 2 diabetes mellitus with diabetic polyneuropathy: Secondary | ICD-10-CM | POA: Diagnosis not present

## 2020-12-25 DIAGNOSIS — J9601 Acute respiratory failure with hypoxia: Secondary | ICD-10-CM | POA: Diagnosis not present

## 2020-12-25 DIAGNOSIS — I82409 Acute embolism and thrombosis of unspecified deep veins of unspecified lower extremity: Secondary | ICD-10-CM | POA: Diagnosis not present

## 2020-12-25 DIAGNOSIS — M5137 Other intervertebral disc degeneration, lumbosacral region: Secondary | ICD-10-CM | POA: Diagnosis not present

## 2020-12-25 DIAGNOSIS — E871 Hypo-osmolality and hyponatremia: Secondary | ICD-10-CM | POA: Diagnosis not present

## 2020-12-25 DIAGNOSIS — Z7982 Long term (current) use of aspirin: Secondary | ICD-10-CM | POA: Diagnosis not present

## 2020-12-25 MED ORDER — ALPRAZOLAM 0.5 MG PO TABS: 0.5000 mg | ORAL_TABLET | Freq: Two times a day (BID) | ORAL | 0 refills | Status: DC | PRN

## 2020-12-25 NOTE — Telephone Encounter (Signed)
Rx xanax sent to pt pharmacy.

## 2020-12-26 ENCOUNTER — Encounter: Payer: Self-pay | Admitting: Medical

## 2020-12-26 LAB — VITAMIN B1: Vitamin B1 (Thiamine): 17 nmol/L (ref 8–30)

## 2020-12-29 ENCOUNTER — Encounter: Payer: Self-pay | Admitting: Internal Medicine

## 2020-12-29 ENCOUNTER — Ambulatory Visit: Payer: Medicare Other

## 2020-12-29 ENCOUNTER — Other Ambulatory Visit: Payer: Self-pay | Admitting: Gastroenterology

## 2020-12-29 ENCOUNTER — Ambulatory Visit: Payer: Medicare Other | Admitting: Internal Medicine

## 2020-12-29 ENCOUNTER — Other Ambulatory Visit: Payer: Self-pay

## 2020-12-29 VITALS — BP 90/58 | HR 111 | Ht 70.0 in | Wt 156.6 lb

## 2020-12-29 DIAGNOSIS — J189 Pneumonia, unspecified organism: Secondary | ICD-10-CM

## 2020-12-29 DIAGNOSIS — R0600 Dyspnea, unspecified: Secondary | ICD-10-CM | POA: Diagnosis not present

## 2020-12-29 DIAGNOSIS — R5381 Other malaise: Secondary | ICD-10-CM | POA: Diagnosis not present

## 2020-12-29 DIAGNOSIS — R0609 Other forms of dyspnea: Secondary | ICD-10-CM

## 2020-12-29 DIAGNOSIS — R5383 Other fatigue: Secondary | ICD-10-CM | POA: Diagnosis not present

## 2020-12-29 DIAGNOSIS — J9601 Acute respiratory failure with hypoxia: Secondary | ICD-10-CM

## 2020-12-29 DIAGNOSIS — J939 Pneumothorax, unspecified: Secondary | ICD-10-CM | POA: Diagnosis not present

## 2020-12-29 DIAGNOSIS — K838 Other specified diseases of biliary tract: Secondary | ICD-10-CM | POA: Diagnosis not present

## 2020-12-29 MED ORDER — PREDNISONE 5 MG PO TABS: ORAL_TABLET | ORAL | 0 refills | Status: AC

## 2020-12-29 NOTE — Patient Instructions (Addendum)
ICD-10-CM   1. Drug-induced pneumonitis  J18.9    T50.905A     2. Pneumothorax, right  J93.9     3. Acute respiratory failure with hypoxia (HCC)  J96.01     4. DOE (dyspnea on exertion)  R06.00     5. Other fatigue  R53.83     6. Physical deconditioning  R53.81       Drug-induced pneumonitis: Improved overall. Oxygen levels held up. But your heart rate is fast and likely due to decondtioning or other medical issues  Fatigue is likely multifactorial - due to disease, admission deconditoning, insomnia  Insomnia: Likely due to prednisone  Physical deconditioning: Likely because of hospitalization and medical issues  Tachycardia: likely due to deconditioning but ? Other factors involved  Plan  - do cxr  2 vierw 12/29/2020 to ensure no pneumothorax and diseae recurrence with reduced steroid  -Continue prednisone 10 mg/day through next week and then change to 5 mg once daily for 1 week and then prednisone 5 mg once daily Monday Wednesday and Friday for 1 week and then prednisone 5 mg once daily on Monday and Friday alone for 1 week and then stop  - ok to restart physical therapy   - discuss tachycardia with PCP Mackie Pai, PA-C (hgb June 2022 and echo April 2022 ok)   Followup  - 6-8 weeks with Dr Chase Caller or app face to face

## 2020-12-29 NOTE — Progress Notes (Signed)
Hospital course 11/10/2020 - 11/23/2020:   .  Patient was recently admitted to [facility] from 11/10/2020 - 11/23/2020 for drug induced pneumonit  Patient presented to the hospital shortness of breath.  He was originally seen at PCPs office and found to be hypoxic.  He was referred to emergency room.  He underwent work-up with CTA chest which showed large right sided pneumonia with groundglass nodules and concern for central cavitation.  He was started on IV antibiotics on admission.  Infectious disease was consulted given prolonged antibiotic course at home and now with atypical pneumonia.  Echocardiogram was negative for valvular vegetation.  MRI was negative for deep infection but revealed a subcutaneous probable seroma.  IR was consulted per ID for aspiration/drainage and culturing of fluid.  He underwent aspiration on 11/13/2020.  Fluid cultures obtained showing no growth.  Patient hospital course complicated by right upper extremity DVT secondary to PICC line.  Patient developed worsening respiratory symptoms and increasing oxygen requirements.  He underwent repeat CT chest which showed worsening right-sided pneumonia.  Pulmonary was consulted and he underwent bronchoscopy on 11/17/2020.  Bronchial washings were generally unremarkable.  There was discussion regarding possibly underlying daptomycin pneumonitis.  Daptomycin was discontinued when cultures patient was de-escalated to Bactrim per ID recommendations.  Patient will follow-up outpatient with ID for ongoing antibiotic management.  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  Patient was discharged home on oxygen.  Will remain on steroids until follow-up with pulmonary on May 17.  He was discharged on Xarelto, given its provoked nature will likely be able to discontinue once imaging is negative in the next 3 to 6 months per protocol.  Patient was discharged on Bactrim and  Xanax given profound anxiety in relation to dyspnea.  Patient is aware Xanax is not and should not be permanent fixture or addition to his home medication list and will need follow-up with PCP to manage prescriptions further.   12/01/2020 -Remains on prednisone 66m daily until follow-up and assess when to taper dose -Continues Bactim BID -FOB on 5/3 with negtive pathology, BAL negative -Follow up with Dr. RChase Callerin ILD clinic in June   Patient reports that he is a little better.  He continues to have some shortness of breath with occasional wheezing.  He has a productive cough which has improved.  Underlying anxiety due to dyspnea symptoms.  He has had no emergencies requiring his albuterol rescue inhaler.  He used this twice since discharge.  He is using flutter valve and incentive spirometer.  He is still on Bactrim and has follow-up with infectious disease in the next week.  Reports difficulty sleeping at night due to steroid.  He has Ambien 5 mg as needed insomnia.  He is using Xanax twice a day as needed for anxiety.  He will follow-up with primary care next Monday. His appetite has improved some. Drinking protein shakes.  Denies fever chills sweats, chest tightness or chest pain.   12/15/2020 - Interim hx  Patient presents today for acute office visit. He had follow-up CT chest on 12/11/20 that showed small-moderate right pneumothorax, approx 15-20%. Previous right upper lobe consolidation has improved without residual consolidative components. There is persistent ground glass attenuation and cystic changes throughout RUL and ground glass in RML and lower lobes. Findings may be post infectious or inflammatory versus sequale of interstitial pneumonia. On call provider was notified of critical results. Patient was called ON 12/13/20 and made  aware of the results. 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 Patient did report some increase in  shortness of breath over the last few days but denies chest pain. Patient opted to wait until Tuesday for follow-up CXR. If pneumothorax still present will need ED evaluation for possible chest tube placement. He has an apt in June with Dr. Chase Caller.   He feels well today. No significant change noted to his breathing. Still able to do some chores indoors. He does not get out of breath at home. He is wearing 2L oxygen. He fell Saturday 5/28, states that he got tripped up with his dog. He hit his face on refrigerator. No LOC or change in mental status. He is on blood thinner. Neuros intact. Bruising to nose.   CXR today showed improvement apical pneumothorax that was seen on previous CT chest, possibly still present on lateral view. Had Dr. Erin Fulling review imaging. Overall reassuring. Plan continue to monitor with repeat CXR on Thursday.   Imaging:  11/23/20 CXR - Persistent right pulmonary opacities and right pleural effusion.Aeration improved compared to 11/17/2020.  12/11/20- CT CHEST WO CONTRAST IMPRESSION: 1. Small to moderate right pneumothorax, approximately 15-20%. This was not present on prior chest imaging, but is not significantly changed from recent thoracic spine radiograph 12/03/2020. 2. Previous right upper lobe consolidation has improved, without residual consolidative components. There is persistent ground-glass attenuation and cystic changes throughout the right upper lobe, with geographic pulmonary 10 UA shin and ground-glass in the right middle and lower lobes. There is mild associated right lung bronchiectasis. Findings may be due to post infectious or inflammatory sequela, versus sequela of interstitial pneumonia. 3. Improved mediastinal adenopathy from prior exam. 4. Faint ground-glass attenuation in the lingula and anterior left upper lobe was present on prior and mildly improved, likely infectious or inflammatory. 5. Stable pulmonary nodules dating back to June 2021  exam     OV 12/29/2020  Subjective:  [name], male , DOB: 17-Nov-1945 , age 75 y.o. , MRN: [id number] , ADDRESS: [place] [address] PCP Mackie Pai, PA-C Patient Care Team: Elise Benne as PCP - General (Internal Medicine) Keene Breath., MD (Ophthalmology) Jessy Oto, MD as Consulting Physician (Orthopedic Surgery)  This Provider for this visit: Treatment Team:  Attending Provider: Brand Males, MD    12/29/2020 -   Chief Complaint  Patient presents with   Follow-up    PFT  performed 5/24.  Pt states since last visit, he has been doing okay. States he has lost some weight that he has noticed since last visit.    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 -returns for follow-up.  I personally saw him in the hospital on 11/20/2020.  At that point in time he had been started on Solu-Medrol on 11/18/2020.  When I saw him he was on day 3 of Solu-Medrol and he was needing 3 L nasal cannula oxygen at rest.  At that point in time even with 3 L he was desaturating to 86% just talking.  Put him on prednisone 60 mg/day.  After that he see nurse practitioner x2 at least 1 of which was a telephone visit..  As part of his follow-up on 12/11/2020 he had a CT scan of the chest [3 days after pulmonary function test] and on my personal visualization it showed new onset pneumothorax but significant improvement in infiltrates  and inflammation.  He had another follow-up chest x-ray 12/17/2020 in which no pneumothorax reported [similar on 12/15/2020] and pulmonary infiltrates appear improved.  He is now here with his wife  He tells me he is now on 10 mg/day prednisone he just darted that today.  He is 1 week left and after that the prednisone taper ends.  Because of the pneumothorax is physical therapy has been on hold but 4 days ago he was evaluated and apparently his pulse ox was 100% but this was  on oxygen.  He continues to use his oxygen although he is not sure if he is desaturating anymore.  He does feel overall improved compared to 6 weeks ago but still has significant amount of fatigue.  He has significant insomnia which he believes is due to prednisone.  Today's last night was the first night he could sleep when he dropped his prednisone down to 10 mg/day.  He believes his fatigue is multifactorial related to the disease, multiple medications, lack of oxygen, insomnia etc.   Symptom socre 12/29/2020  SYMPTOM SCALE - ILD 12/29/2020   O2 use 2-3L [place]  Shortness of Breath 0 -> 5 scale with 5 being worst (score 6 If unable to do)  At rest 0  Simple tasks - showers, clothes change, eating, shaving 1  Household (dishes, doing bed, laundry) 1  Shopping Not yeet  Walking level at own pace 2  Walking up Stairs 2  Total (30-36) Dyspnea Score 6  How bad is your cough? 4  How bad is your fatigue 0  How bad is nausea 0  How bad is vomiting?  0  How bad is diarrhea? 0  How bad is anxiety? 3  How bad is depression 1      Walking desaturation test on room air with a forehead probe today  Simple office walk 185 feet x  3 laps goal with forehead probe 12/29/2020   O2 used ra  Number laps completed 3  Comments about pace steady  Resting Pulse Ox/HR 97% and 120/min  Final Pulse Ox/HR 98% and 137/min  Desaturated </= 88% no  Desaturated <= 3% points no  Got Tachycardic >/= 90/min yes  Symptoms at end of test Dyspneic atn2nd lap  Miscellaneous comments x          CT Chest data  No results found.    PFT  PFT Results Latest Ref Rng & Units 12/08/2020  FVC-Pre L 2.97  FVC-Predicted Pre % 71  FVC-Post L 2.96  FVC-Predicted Post % 71  Pre FEV1/FVC % % 66  Post FEV1/FCV % % 70  FEV1-Pre L 1.97  FEV1-Predicted Pre % 65  FEV1-Post L 2.07  DLCO uncorrected ml/min/mmHg 18.01  DLCO UNC% % 72  DLCO corrected ml/min/mmHg 20.52  DLCO COR %Predicted % 82  DLVA  Predicted % 120  TLC L 5.04  TLC % Predicted % 71  RV % Predicted % 95       has a past medical history of Acute pharyngitis (10/21/2013), Allergy, Anxiety and depression (10/25/2011), BPH (benign prostatic hyperplasia) (04/23/2012), Chicken pox (as a child), DDD (degenerative disc disease), DDD (degenerative disc disease), lumbosacral, Diabetes mellitus, Dyspnea, ED (erectile dysfunction) (04/23/2012), Elevated BP, 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  reports that he quit smoking about 31 years ago. His smoking use included cigarettes. He has a 20.00 pack-year smoking history. He has never used smokeless tobacco.  Past Surgical History:  Procedure Laterality Date   BACK SURGERY  2012 and 1994   Dr Margret Chance, screws and cage in low back   BIOPSY  01/09/2020   Procedure: BIOPSY;  Surgeon: Otis Brace, MD;  Location: [facility];  Service: Gastroenterology;;   BRONCHIAL BIOPSY  11/17/2020   Procedure: BRONCHIAL BIOPSIES;  Surgeon: Laurin Coder, MD;  Location: [facility];  Service: Endoscopy;;   BRONCHIAL BRUSHINGS  11/17/2020   Procedure: BRONCHIAL BRUSHINGS;  Surgeon: Laurin Coder, MD;  Location: [facility];  Service: Endoscopy;;   BRONCHIAL WASHINGS  11/17/2020   Procedure: BRONCHIAL WASHINGS;  Surgeon: Laurin Coder, MD;  Location: [facility];  Service: Endoscopy;;   COLONOSCOPY WITH PROPOFOL N/A 01/09/2020   Procedure: COLONOSCOPY WITH PROPOFOL;  Surgeon: Otis Brace, MD;  Location: [facility];  Service: Gastroenterology;  Laterality: N/A;   ESOPHAGOGASTRODUODENOSCOPY (EGD) WITH PROPOFOL N/A 01/09/2020    Procedure: ESOPHAGOGASTRODUODENOSCOPY (EGD) WITH PROPOFOL;  Surgeon: Otis Brace, MD;  Location: [facility];  Service: Gastroenterology;  Laterality: N/A;   EYE SURGERY Bilateral    2016   HARDWARE REVISION  03/12/2019   REVISION OF HARDWARE THORACIC TEN-THORACIC ELEVEN WITH APPLICATION OF ADDITIONAL RODS AND ROD SLEEVES THORACIC EIGHT-THORACIC TWELVE 12-LUMBAR ONE (N/A )   HEMOSTASIS CONTROL  11/17/2020   Procedure: HEMOSTASIS CONTROL;  Surgeon: Laurin Coder, MD;  Location: [facility];  Service: Endoscopy;;   IR US GUIDE BX ASP/DRAIN  11/13/2020   LAMINECTOMY  02/12/2019   DECOMPRESSIVE LAMINECTOMY THORACIC NINE-THORACIC TEN AND THORACIC TEN-THORACIC ELEVEN, EXTENSION OF THORACOLUMBAR FUSION FROM THORACIC TEN TO THORACIC FIVE, LOCAL BONE GRAFT, ALLOGRAFT AND VIVIGEN (N/A)   POLYPECTOMY  01/09/2020   Procedure: POLYPECTOMY;  Surgeon: Otis Brace, MD;  Location: [facility];  Service: Gastroenterology;;   REMOVAL OF RODS AND PEDICLE SCREWS T5 AND T6, EXPLORATION OF FUSION, BIOPSY TRANSPEDICULAR T5 AND T6 (N/A )  09/14/2020   SPINAL FUSION N/A 02/12/2019   Procedure: DECOMPRESSIVE LAMINECTOMY THORACIC NINE-THORACIC TEN AND THORACIC TEN-THORACIC ELEVEN, EXTENSION OF THORACOLUMBAR FUSION FROM THORACIC TEN TO THORACIC FIVE, LOCAL BONE GRAFT, ALLOGRAFT AND VIVIGEN;  Surgeon: Jessy Oto, MD;  Location: [facility];  Service: Orthopedics;  Laterality: N/A;  DECOMPRESSIVE LAMINECTOMY THORACIC NINE-THORACIC TEN AND THORACIC TEN-THORACIC ELEVEN, EXTENSION OF TH   SPINAL FUSION N/A 03/12/2019   Procedure: REVISION OF HARDWARE THORACIC TEN-THORACIC ELEVEN WITH APPLICATION OF ADDITIONAL RODS AND ROD SLEEVES THORACIC EIGHT-THORACIC TWELVE 12-LUMBAR ONE;  Surgeon: Jessy Oto, MD;  Location: [facility];  Service: Orthopedics;  Laterality: N/A;   TONSILLECTOMY     as child   torn rotator cuff  2010   right   VIDEO BRONCHOSCOPY N/A 11/17/2020   Procedure: VIDEO BRONCHOSCOPY WITH FLUORO;  Surgeon:  Laurin Coder, MD;  Location: [facility];  Service: Endoscopy;  Laterality: N/A;    No Known Allergies  Immunization History  Administered Date(s) Administered   Fluad Quad(high Dose 65+) 04/05/2019, 06/29/2020   Influenza Split 04/23/2012   Influenza Whole 02/16/2011   Influenza, High Dose Seasonal PF 05/06/2013, 03/25/2016, 05/05/2017, 06/07/2018   PFIZER(Purple Top)SARS-COV-2 Vaccination 08/23/2019, 09/13/2019   Pneumococcal Conjugate-13 03/25/2016   Pneumococcal Polysaccharide-23 01/20/2009, 10/19/2011   Td 07/09/2003, 07/18/2008   Tdap 07/20/2011   Zoster, Live 04/27/2016    Family History  Problem Relation Age of Onset   Hypertension Mother  Diabetes Mother        type 2   Cancer Mother 57       breast in remission   Emphysema Father        smoker   COPD Father        smoker   Stroke Father 53       mini   Heart disease Father    Hypertension Sister    Hyperlipidemia Sister    Scoliosis Sister    Osteoporosis Sister    Hypertension Maternal Grandmother    Scoliosis Maternal Grandmother    Heart disease Maternal Grandfather    Heart disease Paternal Grandfather        smoker   Heart disease Daughter      Current Outpatient Medications:    ALPRAZolam (XANAX) 0.5 MG tablet, Take 1 tablet (0.5 mg total) by mouth 2 (two) times daily as needed for anxiety., Disp: 30 tablet, Rfl: 0   atorvastatin (LIPITOR) 10 MG tablet, TAKE 1 TABLET BY MOUTH EVERY DAY, Disp: 30 tablet, Rfl: 0   b complex vitamins capsule, Take 1 capsule by mouth daily., Disp: , Rfl:    BAYER ASPIRIN EC LOW DOSE 81 MG EC tablet, Take 81 mg by mouth every morning., Disp: , Rfl:    busPIRone (BUSPAR) 7.5 MG tablet, 1-2 TAB BY MOUTH 3 TIMES A DAY FOR ANXIETY (Patient taking differently: Take 7.5 mg by mouth 2 (two) times daily.), Disp: 540 tablet, Rfl: 1   cetirizine (ZYRTEC) 10 MG tablet, Take 10 mg by mouth daily. In the morning, Disp: , Rfl:    dapsone 100 MG tablet, Take 1 tablet (100 mg  total) by mouth daily., Disp: 30 tablet, Rfl: 1   famotidine (PEPCID) 40 MG tablet, TAKE 1 TABLET BY MOUTH EVERYDAY AT BEDTIME (Patient taking differently: Take 40 mg by mouth at bedtime.), Disp: 90 tablet, Rfl: 1   ferrous fumarate (FERRETTS) 325 (106 Fe) MG TABS tablet, Take 1 tablet (106 mg of iron total) by mouth daily., Disp: 90 tablet, Rfl: 3   glucose blood (CONTOUR NEXT TEST) test strip, Check blood sugar once daily, Disp: 100 each, Rfl: 12   JANUVIA 50 MG tablet, TAKE 1 TABLET BY MOUTH EVERY DAY, Disp: 30 tablet, Rfl: 3   metFORMIN (GLUCOPHAGE) 1000 MG tablet, Take 1 tablet (1,000 mg total) by mouth 2 (two) times daily with a meal., Disp: 180 tablet, Rfl: 0   nystatin cream (MYCOSTATIN), Apply 1 application topically 2 (two) times daily., Disp: 30 g, Rfl: 1   olmesartan (BENICAR) 5 MG tablet, Take 1 tablet (5 mg total) by mouth daily., Disp: 30 tablet, Rfl: 2   pantoprazole (PROTONIX) 40 MG tablet, TAKE 1 TABLET BY MOUTH TWICE A DAY BEFORE A MEAL, Disp: 180 tablet, Rfl: 1   predniSONE (DELTASONE) 10 MG tablet, Take 5 tablets (50 mg total) by mouth daily with breakfast for 7 days, THEN 4 tablets (40 mg total) daily with breakfast for 7 days, THEN 3 tablets (30 mg total) daily with breakfast for 7 days, THEN 2 tablets (20 mg total) daily with breakfast for 7 days, THEN 1 tablet (10 mg total) daily with breakfast for 7 days., Disp: 105 tablet, Rfl: 0   predniSONE (DELTASONE) 5 MG tablet, Take 1 tablet (5 mg total) by mouth daily with breakfast for 7 days, THEN 1 tablet (5 mg total) 3 (three) times a week for 7 days, THEN 1 tablet (5 mg total) 2 (two) times a week for 7 days.,  Disp: 12 tablet, Rfl: 0   pregabalin (LYRICA) 75 MG capsule, TAKE 1 CAPSULE BY MOUTH TWICE A DAY (Patient taking differently: Take 75 mg by mouth 2 (two) times daily.), Disp: 60 capsule, Rfl: 3   PROVENTIL HFA 108 (90 Base) MCG/ACT inhaler, TAKE 2 PUFFS BY MOUTH EVERY 6 HOURS AS NEEDED FOR WHEEZE OR SHORTNESS OF BREATH, Disp:  6.7 each, Rfl: 5   rivaroxaban (XARELTO) 20 MG TABS tablet, Take 1 tablet (20 mg total) by mouth daily with supper., Disp: 30 tablet, Rfl: 0   sodium chloride (OCEAN) 0.65 % SOLN nasal spray, Place 1 spray into both nostrils as needed for congestion., Disp: 20 mL, Rfl: 0   sucralfate (CARAFATE) 1 g tablet, TAKE 1 TABLET (1 G TOTAL) BY MOUTH 4 (FOUR) TIMES DAILY - WITH MEALS AND AT BEDTIME., Disp: 120 tablet, Rfl: 1   tamsulosin (FLOMAX) 0.4 MG CAPS capsule, Take 2 capsules (0.8 mg total) by mouth daily., Disp: 180 capsule, Rfl: 3   terbinafine (LAMISIL) 250 MG tablet, Take 1 tablet (250 mg total) by mouth daily., Disp: 3 tablet, Rfl: 0   testosterone cypionate (DEPOTESTOSTERONE CYPIONATE) 200 MG/ML injection, INJECT 1 ML (200 MG TOTAL) INTO THE MUSCLE EVERY 14 (FOURTEEN) DAYS., Disp: 2 mL, Rfl: 0   tiZANidine (ZANAFLEX) 4 MG tablet, Take 1.5 tablets (6 mg total) by mouth every 6 (six) hours as needed for muscle spasms., Disp: 540 tablet, Rfl: 1   venlafaxine XR (EFFEXOR-XR) 150 MG 24 hr capsule, TAKE 1 CAPSULE (150 MG TOTAL) BY MOUTH DAILY WITH BREAKFAST., Disp: 90 capsule, Rfl: 1   HYDROcodone-acetaminophen (NORCO) 7.5-325 MG tablet, Take 1 tablet by mouth every 4 (four) hours as needed for up to 7 days for moderate pain ((score 4 to 6))., Disp: 40 tablet, Rfl: 0  Current Facility-Administered Medications:    testosterone cypionate (DEPOTESTOSTERONE CYPIONATE) injection 200 mg, 200 mg, Intramuscular, Q14 Days, Saguier, Edward, PA-C, 200 mg at 09/11/20 0951      Objective:   Vitals:   12/29/20 1117  BP: (!) 90/58  Pulse: (!) 111  SpO2: 98%  Weight: 156 lb 9.6 oz (71 kg)  Height: _0  (1.778 m)    Estimated body mass index is 22.47 kg/m as calculated from the following:   Height as of this encounter: _1  (1.778 m).   Weight as of this encounter: 156 lb 9.6 oz (71 kg).  _2 @  Filed Weights   12/29/20 1117  Weight: 156 lb 9.6 oz (71 kg)     Physical  Exam    General: No distress. Decodnitoned somehwat. Anxious Neuro: Alert and Oriented x 3. GCS 15. Speech normal Psych: Pleasant Resp:  Barrel Chest - no.  Wheeze - no, Crackles - no, No overt respiratory distress CVS: Normal heart sounds. Murmurs - no Ext: Stigmata of Connective Tissue Disease - no HEENT: Normal upper airway. PEERL +. No post nasal drip        Assessment:       ICD-10-CM   1. Drug-induced pneumonitis  J18.9 DG Chest 2 View   T50.905A AMB referral to pulmonary rehabilitation    2. Pneumothorax, right  J93.9 DG Chest 2 View    AMB referral to pulmonary rehabilitation    3. Acute respiratory failure with hypoxia (HCC)  J96.01     4. DOE (dyspnea on exertion)  R06.00 AMB referral to pulmonary rehabilitation    5. Other fatigue  R53.83     6. Physical deconditioning  R53.81 AMB referral to  pulmonary rehabilitation         Plan:     Patient Instructions     ICD-10-CM   1. Drug-induced pneumonitis  J18.9    T50.905A     2. Pneumothorax, right  J93.9     3. Acute respiratory failure with hypoxia (HCC)  J96.01     4. DOE (dyspnea on exertion)  R06.00     5. Other fatigue  R53.83     6. Physical deconditioning  R53.81       Drug-induced pneumonitis: Improved overall. Oxygen levels held up. But your heart rate is fast and likely due to decondtioning or other medical issues  Fatigue is likely multifactorial - due to disease, admission deconditoning, insomnia  Insomnia: Likely due to prednisone  Physical deconditioning: Likely because of hospitalization and medical issues  Tachycardia: likely due to deconditioning but ? Other factors involved  Plan  - do cxr  2 vierw 12/29/2020 to ensure no pneumothorax and diseae recurrence with reduced steroid  -Continue prednisone 10 mg/day through next week and then change to 5 mg once daily for 1 week and then prednisone 5 mg once daily Monday Wednesday and Friday for 1 week and then prednisone 5 mg once  daily on Monday and Friday alone for 1 week and then stop  - ok to restart physical therapy   - discuss tachycardia with PCP Saguier, Percell Miller, PA-C (hgb June 2022 and echo April 2022 ok)   - no need for o2 [place] eand if pulse ox stays > 88% in few weeks even with exertion - you can return the o2  - refer pulmonary rehab Followup  - 6-8 weeks with Dr Chase Caller or app face to face  ( Level 05 visit: Estb 40-54 min    in  visit type: on-site physical face to visit  in total care time and counseling or/and coordination of care by this undersigned MD - Dr Brand Males. This includes one or more of the following on this same day 12/29/2020: pre-charting, chart review, note writing, documentation discussion of test results, diagnostic or treatment recommendations, prognosis, risks and benefits of management options, instructions, education, compliance or risk-factor reduction. It excludes time spent by the [facility] or office staff in the care of the patient. Actual time 4 min)   SIGNATURE    Dr. Brand Males, M.D., F.C.C.P,  Pulmonary and Critical Care Medicine Staff Physician, [place] Director - Interstitial Lung Disease  Program  Pulmonary [facility] [place], [place], [address]  Pager: [phone], If no answer or between  15:00h - 7:00h: call [phone] Telephone: [phone]  12:27 PM 12/29/2020

## 2020-12-30 ENCOUNTER — Encounter (HOSPITAL_COMMUNITY): Payer: Self-pay | Admitting: *Deleted

## 2020-12-30 ENCOUNTER — Encounter: Payer: Self-pay | Admitting: Medical

## 2020-12-30 DIAGNOSIS — E1142 Type 2 diabetes mellitus with diabetic polyneuropathy: Secondary | ICD-10-CM | POA: Diagnosis not present

## 2020-12-30 DIAGNOSIS — E871 Hypo-osmolality and hyponatremia: Secondary | ICD-10-CM | POA: Diagnosis not present

## 2020-12-30 DIAGNOSIS — G4700 Insomnia, unspecified: Secondary | ICD-10-CM | POA: Diagnosis not present

## 2020-12-30 DIAGNOSIS — Z7982 Long term (current) use of aspirin: Secondary | ICD-10-CM | POA: Diagnosis not present

## 2020-12-30 DIAGNOSIS — M5137 Other intervertebral disc degeneration, lumbosacral region: Secondary | ICD-10-CM | POA: Diagnosis not present

## 2020-12-30 DIAGNOSIS — Z7984 Long term (current) use of oral hypoglycemic drugs: Secondary | ICD-10-CM | POA: Diagnosis not present

## 2020-12-30 DIAGNOSIS — I10 Essential (primary) hypertension: Secondary | ICD-10-CM | POA: Diagnosis not present

## 2020-12-30 DIAGNOSIS — J9601 Acute respiratory failure with hypoxia: Secondary | ICD-10-CM | POA: Diagnosis not present

## 2020-12-30 DIAGNOSIS — E785 Hyperlipidemia, unspecified: Secondary | ICD-10-CM | POA: Diagnosis not present

## 2020-12-30 DIAGNOSIS — I82409 Acute embolism and thrombosis of unspecified deep veins of unspecified lower extremity: Secondary | ICD-10-CM | POA: Diagnosis not present

## 2020-12-30 NOTE — Progress Notes (Signed)
Received referral from Dr. Chase Caller for this pt to participate in pulmonary rehab with the the diagnosis of right Pneumothorax. Clinical review of pt follow up appt on 6/14 Pulmonary office note. Also reviewed discharge summary, previous pulmonary office note, ID and PCP  Pt with Covid Risk Score - 6. Pt appropriate for scheduling for Pulmonary rehab.  Will forward to support staff for scheduling  when able as there is a wait list of 2- 3 month and verification of insurance eligibility/benefits with pt consent. Cherre Huger, BSN Cardiac and Training and development officer

## 2020-12-31 ENCOUNTER — Ambulatory Visit (INDEPENDENT_AMBULATORY_CARE_PROVIDER_SITE_OTHER): Payer: Medicare Other

## 2020-12-31 ENCOUNTER — Other Ambulatory Visit: Payer: Self-pay

## 2020-12-31 ENCOUNTER — Other Ambulatory Visit (INDEPENDENT_AMBULATORY_CARE_PROVIDER_SITE_OTHER): Payer: Medicare Other

## 2020-12-31 DIAGNOSIS — E538 Deficiency of other specified B group vitamins: Secondary | ICD-10-CM | POA: Diagnosis not present

## 2020-12-31 DIAGNOSIS — R5383 Other fatigue: Secondary | ICD-10-CM | POA: Diagnosis not present

## 2020-12-31 LAB — ACID FAST CULTURE WITH REFLEXED SENSITIVITIES (MYCOBACTERIA): Acid Fast Culture: NEGATIVE

## 2020-12-31 MED ORDER — CYANOCOBALAMIN 1000 MCG/ML IJ SOLN
1000.0000 ug | Freq: Once | INTRAMUSCULAR | Status: AC
  Administered 2020-12-31: 1000 ug via INTRAMUSCULAR

## 2020-12-31 NOTE — Progress Notes (Addendum)
Luis Brown is a 75 y.o. male presents to the office today for B12 :  injections, per physician's orders. Original order: for B12 on lab results from 12-22-2020 Cyanocobalamim (med), 1000 mg/ ml (dose),  IM (route) was administered Left deltoid (location) today. Patient tolerated injection.  Patient next injection due: in one week, appt made Yes next appt 01-10-21 for b 12 # 3 of 4 weekly and tetosterone injection (patiet will bring medication).  Jolene Provost Rennis Petty, PA-C

## 2021-01-01 ENCOUNTER — Other Ambulatory Visit: Payer: Self-pay | Admitting: Internal Medicine

## 2021-01-01 ENCOUNTER — Encounter: Payer: Self-pay | Admitting: Medical

## 2021-01-01 LAB — T4, FREE: Free T4: 0.98 ng/dL (ref 0.60–1.60)

## 2021-01-01 LAB — TSH: TSH: 2.36 u[IU]/mL (ref 0.35–4.50)

## 2021-01-01 NOTE — Telephone Encounter (Signed)
Dr. Chase Caller, Patient sent you this message this afternoon.   Sorry to keep asking questions, but I have not much to go on with this kind of illness. After Tuesdays walking and wed rebab, I find myself in allot of fatigue. Is it normal or should I just bounce right back. I don't have a great appetite, Any ideas about a appetite booster? I have peptone drinks, bars, etc. just really want to get better soon. Thank you for your time. Luis Brown    LOV 12/29/20-MR  Instructions      ICD-10-CM    1. 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 Pneumothorax, right J93.9       3. Acute respiratory failure with hypoxia (HCC) J96.01       4. DOE (dyspnea on exertion) R06.00       5. Other fatigue R53.83       6. Physical deconditioning R53.81           Drug-induced pneumonitis: Improved overall. Oxygen levels held up. But your heart rate is fast and likely due to decondtioning or other medical issues   Fatigue is likely multifactorial - due to disease, admission deconditoning, insomnia   Insomnia: Likely due to prednisone   Physical deconditioning: Likely because of hospitalization and medical issues   Tachycardia: likely due to deconditioning but ? Other factors involved   Plan  - do cxr  2 vierw 12/29/2020 to ensure no pneumothorax and diseae recurrence with reduced steroid   -Continue prednisone 10 mg/day through next week and then change to 5 mg once daily for 1 week and then prednisone 5 mg once daily Monday Wednesday and Friday for 1 week and then prednisone 5 mg once daily on Monday and Friday alone for 1 week and then stop   - ok to restart physical therapy    - discuss tachycardia with PCP Mackie Pai, PA-C (hgb June 2022 and echo April 2022 ok)     Followup  - 6-8 weeks with Dr Chase Caller or app face to face

## 2021-01-01 NOTE — Telephone Encounter (Signed)
As discussed in the most recent clinic visit fatigue and physical deconditioning after major illness is very common.  And it does take time to bounce back.  When we see such patients we always ask if it is consistent with the current course or if there are any reversible things or additional things that need to be fixed.  In his situation I found the lungs to have healed significantly.  However, his heart rate was quite fast even at rest.  He needs to address this with the primary care physician to see if the fast heart rate is just because of deconditioning or if there is any other medical problems contributing to fast heart rate.  If the fast heart rate is just from deconditioning then that will get better with rehab and he will slowly get better.  If the heart fast heart rate is from other medical conditions and this need to be fixed so he can continue to get better  Overall course for this takes weeks and months to fully feel better  Appetite: I do not know of any medications.  He will have to address this with primary care physician

## 2021-01-02 ENCOUNTER — Other Ambulatory Visit: Payer: Medicare Other

## 2021-01-04 LAB — VITAMIN D 1,25 DIHYDROXY
Vitamin D 1, 25 (OH)2 Total: 55 pg/mL (ref 18–72)
Vitamin D2 1, 25 (OH)2: <8 pg/mL
Vitamin D3 1, 25 (OH)2: 55 pg/mL

## 2021-01-05 ENCOUNTER — Other Ambulatory Visit: Payer: Self-pay

## 2021-01-05 ENCOUNTER — Encounter: Payer: Self-pay | Admitting: Medical

## 2021-01-05 ENCOUNTER — Ambulatory Visit (INDEPENDENT_AMBULATORY_CARE_PROVIDER_SITE_OTHER): Payer: Medicare Other | Admitting: Medical

## 2021-01-05 VITALS — BP 130/70 | HR 108 | Temp 98.1°F | Resp 20 | Ht 70.0 in | Wt 156.5 lb

## 2021-01-05 DIAGNOSIS — R000 Tachycardia, unspecified: Secondary | ICD-10-CM | POA: Diagnosis not present

## 2021-01-05 DIAGNOSIS — R5383 Other fatigue: Secondary | ICD-10-CM

## 2021-01-05 DIAGNOSIS — I82409 Acute embolism and thrombosis of unspecified deep veins of unspecified lower extremity: Secondary | ICD-10-CM | POA: Diagnosis not present

## 2021-01-05 DIAGNOSIS — I10 Essential (primary) hypertension: Secondary | ICD-10-CM | POA: Diagnosis not present

## 2021-01-05 DIAGNOSIS — E538 Deficiency of other specified B group vitamins: Secondary | ICD-10-CM | POA: Diagnosis not present

## 2021-01-05 DIAGNOSIS — E119 Type 2 diabetes mellitus without complications: Secondary | ICD-10-CM

## 2021-01-05 DIAGNOSIS — E785 Hyperlipidemia, unspecified: Secondary | ICD-10-CM | POA: Diagnosis not present

## 2021-01-05 DIAGNOSIS — M4624 Osteomyelitis of vertebra, thoracic region: Secondary | ICD-10-CM

## 2021-01-05 DIAGNOSIS — E1142 Type 2 diabetes mellitus with diabetic polyneuropathy: Secondary | ICD-10-CM | POA: Diagnosis not present

## 2021-01-05 DIAGNOSIS — E871 Hypo-osmolality and hyponatremia: Secondary | ICD-10-CM | POA: Diagnosis not present

## 2021-01-05 DIAGNOSIS — J9601 Acute respiratory failure with hypoxia: Secondary | ICD-10-CM | POA: Diagnosis not present

## 2021-01-05 DIAGNOSIS — Z7982 Long term (current) use of aspirin: Secondary | ICD-10-CM | POA: Diagnosis not present

## 2021-01-05 DIAGNOSIS — D649 Anemia, unspecified: Secondary | ICD-10-CM | POA: Diagnosis not present

## 2021-01-05 DIAGNOSIS — M5137 Other intervertebral disc degeneration, lumbosacral region: Secondary | ICD-10-CM | POA: Diagnosis not present

## 2021-01-05 DIAGNOSIS — Z7984 Long term (current) use of oral hypoglycemic drugs: Secondary | ICD-10-CM | POA: Diagnosis not present

## 2021-01-05 DIAGNOSIS — G4700 Insomnia, unspecified: Secondary | ICD-10-CM | POA: Diagnosis not present

## 2021-01-05 LAB — COMPREHENSIVE METABOLIC PANEL
ALT: 21 U/L (ref 0–53)
AST: 15 U/L (ref 0–37)
Albumin: 4.1 g/dL (ref 3.5–5.2)
Alkaline Phosphatase: 65 U/L (ref 39–117)
BUN: 16 mg/dL (ref 6–23)
CO2: 31 mEq/L (ref 19–32)
Calcium: 9.3 mg/dL (ref 8.4–10.5)
Chloride: 95 mEq/L — ABNORMAL LOW (ref 96–112)
Creatinine, Ser: 0.56 mg/dL (ref 0.40–1.50)
GFR: 95.17 mL/min (ref >60.00)
Glucose, Bld: 187 mg/dL — ABNORMAL HIGH (ref 70–99)
Potassium: 4.5 mEq/L (ref 3.5–5.1)
Sodium: 134 mEq/L — ABNORMAL LOW (ref 135–145)
Total Bilirubin: 0.9 mg/dL (ref 0.2–1.2)
Total Protein: 5.9 g/dL — ABNORMAL LOW (ref 6.0–8.3)

## 2021-01-05 LAB — CBC WITH DIFFERENTIAL/PLATELET
Basophils Absolute: 0 10*3/uL (ref 0.0–0.1)
Basophils Relative: 0.3 % (ref 0.0–3.0)
Eosinophils Absolute: 0 10*3/uL (ref 0.0–0.7)
Eosinophils Relative: 0.5 % (ref 0.0–5.0)
HCT: 33.3 % — ABNORMAL LOW (ref 39.0–52.0)
Hemoglobin: 11.1 g/dL — ABNORMAL LOW (ref 13.0–17.0)
Lymphocytes Relative: 8.2 % — ABNORMAL LOW (ref 12.0–46.0)
Lymphs Abs: 0.6 10*3/uL — ABNORMAL LOW (ref 0.7–4.0)
MCHC: 33.3 g/dL (ref 30.0–36.0)
MCV: 93.1 fl (ref 78.0–100.0)
Monocytes Absolute: 1 10*3/uL (ref 0.1–1.0)
Monocytes Relative: 12.9 % — ABNORMAL HIGH (ref 3.0–12.0)
Neutro Abs: 6 10*3/uL (ref 1.4–7.7)
Neutrophils Relative %: 78.1 % — ABNORMAL HIGH (ref 43.0–77.0)
Platelets: 367 10*3/uL (ref 150.0–400.0)
RBC: 3.58 Mil/uL — ABNORMAL LOW (ref 4.22–5.81)
RDW: 21.6 % — ABNORMAL HIGH (ref 11.5–15.5)
WBC: 7.7 10*3/uL (ref 4.0–10.5)

## 2021-01-05 LAB — IRON: Iron: 92 ug/dL (ref 42–165)

## 2021-01-05 NOTE — Progress Notes (Signed)
Subjective:    [name], male    DOB: 18-Apr-1946, 75 y.o.   MRN: [id number]  HPI  Pt has fatigue. His b12 injection two injections so far. He is on b complex but not on b12 by itself.  Pt bp is higher today. He has had some low bp at his house. Pt is not on olmasartan.    Pt has been off 02 for one week.   He is still sleeping poorly.   Pt is eating more recently but varies from day to day.    Pt had recent pulmonologist. Below is A/P from specialiast office.  Drug-induced pneumonitis: Improved overall. Oxygen levels held up. But your heart rate is fast and likely due to decondtioning or other medical issues   Fatigue is likely multifactorial - due to disease, admission deconditoning, insomnia   Insomnia: Likely due to prednisone   Physical deconditioning: Likely because of hospitalization and medical issues   Tachycardia: likely due to deconditioning but ? Other factors involved   Plan  - do cxr  2 vierw 12/29/2020 to ensure no pneumothorax and diseae recurrence with reduced steroid   -Continue prednisone 10 mg/day through next week and then change to 5 mg once daily for 1 week and then prednisone 5 mg once daily Monday Wednesday and Friday for 1 week and then prednisone 5 mg once daily on Monday and Friday alone for 1 week and then stop   - ok to restart physical therapy    - discuss tachycardia with PCP Gunner Iodice, Percell Miller, PA-C (hgb June 2022 and echo April 2022 ok)  Pt has hx of subacute osteomyelitis. Pt on dapsone.     Review of Systems  Constitutional:  Negative for chills and fever.  HENT:  Negative for congestion, ear discharge, ear pain, sinus pressure, sinus pain and sneezing.   Respiratory:  Positive for shortness of breath. Negative for cough, chest tightness and wheezing.        Some shortness of breath with acitivity as he was instructed to stop 02.  Cardiovascular:  Negative for chest pain and palpitations.  Gastrointestinal:  Negative  for abdominal pain, blood in stool, diarrhea and nausea.  Genitourinary:  Negative for difficulty urinating, dysuria, genital sores and hematuria.  Musculoskeletal:  Negative for gait problem.  Skin:  Negative for rash.  Neurological:  Negative for dizziness, speech difficulty, weakness, numbness and headaches.  Hematological:  Negative for adenopathy. Does not bruise/bleed easily.  Psychiatric/Behavioral:  Positive for dysphoric mood and sleep disturbance. Negative for behavioral problems and confusion.      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   Esophageal reflux 02/10/2015   Fatigue    HTN (hypertension)    Hyperglycemia    preDM    Hyperlipidemia    Insomnia    Low back pain radiating to both legs 01/16/2017   Measles as a child   Overweight(278.02)    Peripheral neuropathy 08/07/2019   Personal history of  colonic polyps 10/27/2012   Follows with [facility]   Pneumonia    " walking"   Preventative health care    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   Wears glasses      Social History   Socioeconomic History   Marital status: Married    Spouse name: Not on file   Number of children: Not on file   Years of education: Not on file   Highest education level: Not on file  Occupational History   Not on file  Tobacco Use   Smoking status: Former    Packs/day: 1.00    Years: 20.00    Pack years: 20.00    Types: Cigarettes    Quit date: 07/18/1989    Years since quitting: 31.4   Smokeless tobacco: Never   Tobacco comments:     off and on smoking for the 20 yrs  Vaping Use   Vaping Use: Never used  Substance and Sexual Activity   Alcohol use: Not Currently    Comment: drink or two a day- mixed drink   Drug use: No   Sexual activity: Yes    Comment: lives with wife, still working, no dietary restrictions, continues to exercise intermittently  Other Topics Concern   Not on file  Social History Narrative   Not on file   Social Determinants of Health   Financial Resource Strain: Not on file  Food Insecurity: Not on file  Transportation Needs: Not on file  Physical Activity: Not on file  Stress: Not on file  Social Connections: Not on file  Intimate Partner Violence: Not on file    Past Surgical History:  Procedure Laterality Date   BACK SURGERY  2012 and 1994   Dr Margret Chance, screws and cage in low back   BIOPSY  01/09/2020   Procedure: BIOPSY;  Surgeon: Otis Brace, MD;  Location: [facility];  Service: Gastroenterology;;   BRONCHIAL BIOPSY  11/17/2020   Procedure: BRONCHIAL BIOPSIES;  Surgeon: Laurin Coder, MD;  Location: [facility];  Service: Endoscopy;;   BRONCHIAL BRUSHINGS  11/17/2020   Procedure: BRONCHIAL BRUSHINGS;  Surgeon: Laurin Coder, MD;  Location: [facility];  Service: Endoscopy;;   BRONCHIAL WASHINGS  11/17/2020   Procedure: BRONCHIAL WASHINGS;  Surgeon: Laurin Coder, MD;  Location: [facility];  Service: Endoscopy;;   COLONOSCOPY WITH PROPOFOL N/A 01/09/2020   Procedure: COLONOSCOPY WITH PROPOFOL;  Surgeon: Otis Brace, MD;  Location: [facility];  Service: Gastroenterology;  Laterality: N/A;   ESOPHAGOGASTRODUODENOSCOPY (EGD) WITH PROPOFOL N/A 01/09/2020   Procedure: ESOPHAGOGASTRODUODENOSCOPY (EGD) WITH PROPOFOL;  Surgeon: Otis Brace, MD;  Location: [facility];  Service: Gastroenterology;  Laterality: N/A;   EYE SURGERY Bilateral    2016   HARDWARE REVISION  03/12/2019   REVISION OF HARDWARE THORACIC TEN-THORACIC ELEVEN WITH APPLICATION OF  ADDITIONAL RODS AND ROD SLEEVES THORACIC EIGHT-THORACIC TWELVE 12-LUMBAR ONE (N/A )   HEMOSTASIS CONTROL  11/17/2020   Procedure: HEMOSTASIS CONTROL;  Surgeon: Laurin Coder, MD;  Location: [facility];  Service: Endoscopy;;   IR US GUIDE BX ASP/DRAIN  11/13/2020   LAMINECTOMY  02/12/2019   DECOMPRESSIVE LAMINECTOMY THORACIC NINE-THORACIC TEN AND THORACIC TEN-THORACIC ELEVEN, EXTENSION OF THORACOLUMBAR FUSION FROM THORACIC TEN TO THORACIC FIVE, LOCAL BONE GRAFT, ALLOGRAFT AND VIVIGEN (N/A)   POLYPECTOMY  01/09/2020   Procedure: POLYPECTOMY;  Surgeon: Otis Brace, MD;  Location: [facility];  Service: Gastroenterology;;   REMOVAL OF RODS AND PEDICLE SCREWS T5 AND T6, EXPLORATION OF FUSION, BIOPSY TRANSPEDICULAR T5 AND T6 (  N/A )  09/14/2020   SPINAL FUSION N/A 02/12/2019   Procedure: DECOMPRESSIVE LAMINECTOMY THORACIC NINE-THORACIC TEN AND THORACIC TEN-THORACIC ELEVEN, EXTENSION OF THORACOLUMBAR FUSION FROM THORACIC TEN TO THORACIC FIVE, LOCAL BONE GRAFT, ALLOGRAFT AND VIVIGEN;  Surgeon: Jessy Oto, MD;  Location: [place];  Service: Orthopedics;  Laterality: N/A;  DECOMPRESSIVE LAMINECTOMY THORACIC NINE-THORACIC TEN AND THORACIC TEN-THORACIC ELEVEN, EXTENSION OF TH   SPINAL FUSION N/A 03/12/2019   Procedure: REVISION OF HARDWARE THORACIC TEN-THORACIC ELEVEN WITH APPLICATION OF ADDITIONAL RODS AND ROD SLEEVES THORACIC EIGHT-THORACIC TWELVE 12-LUMBAR ONE;  Surgeon: Jessy Oto, MD;  Location: [place];  Service: Orthopedics;  Laterality: N/A;   TONSILLECTOMY     as child   torn rotator cuff  2010   right   VIDEO BRONCHOSCOPY N/A 11/17/2020   Procedure: VIDEO BRONCHOSCOPY WITH FLUORO;  Surgeon: Laurin Coder, MD;  Location: [facility];  Service: Endoscopy;  Laterality: N/A;    Family History  Problem Relation Age of Onset   Hypertension Mother    Diabetes Mother        type 2   Cancer Mother 64       breast in remission   Emphysema Father        smoker   COPD Father         smoker   Stroke Father 6       mini   Heart disease Father    Hypertension Sister    Hyperlipidemia Sister    Scoliosis Sister    Osteoporosis Sister    Hypertension Maternal Grandmother    Scoliosis Maternal Grandmother    Heart disease Maternal Grandfather    Heart disease Paternal Grandfather        smoker   Heart disease Daughter     No Known Allergies  Current Outpatient Medications on File Prior to Visit  Medication Sig Dispense Refill   ALPRAZolam (XANAX) 0.5 MG tablet Take 1 tablet (0.5 mg total) by mouth 2 (two) times daily as needed for anxiety. 30 tablet 0   atorvastatin (LIPITOR) 10 MG tablet TAKE 1 TABLET BY MOUTH EVERY DAY 30 tablet 0   b complex vitamins capsule Take 1 capsule by mouth daily.     BAYER ASPIRIN EC LOW DOSE 81 MG EC tablet Take 81 mg by mouth every morning.     busPIRone (BUSPAR) 7.5 MG tablet 1-2 TAB BY MOUTH 3 TIMES A DAY FOR ANXIETY (Patient taking differently: Take 7.5 mg by mouth 2 (two) times daily.) 540 tablet 1   cetirizine (ZYRTEC) 10 MG tablet Take 10 mg by mouth daily. In the morning     dapsone 100 MG tablet Take 1 tablet (100 mg total) by mouth daily. 30 tablet 1   famotidine (PEPCID) 40 MG tablet TAKE 1 TABLET BY MOUTH EVERYDAY AT BEDTIME (Patient taking differently: Take 40 mg by mouth at bedtime.) 90 tablet 1   ferrous fumarate (FERRETTS) 325 (106 Fe) MG TABS tablet Take 1 tablet (106 mg of iron total) by mouth daily. 90 tablet 3   glucose blood (CONTOUR NEXT TEST) test strip Check blood sugar once daily 100 each 12   JANUVIA 50 MG tablet TAKE 1 TABLET BY MOUTH EVERY DAY 30 tablet 3   metFORMIN (GLUCOPHAGE) 1000 MG tablet Take 1 tablet (1,000 mg total) by mouth 2 (two) times daily with a meal. 180 tablet 0   nystatin cream (MYCOSTATIN) Apply 1 application topically 2 (two) times daily. 30 g 1   olmesartan (BENICAR)  5 MG tablet Take 1 tablet (5 mg total) by mouth daily. 30 tablet 2   pantoprazole (PROTONIX) 40 MG tablet TAKE 1 TABLET BY  MOUTH TWICE A DAY BEFORE A MEAL 180 tablet 1   predniSONE (DELTASONE) 10 MG tablet Take 5 tablets (50 mg total) by mouth daily with breakfast for 7 days, THEN 4 tablets (40 mg total) daily with breakfast for 7 days, THEN 3 tablets (30 mg total) daily with breakfast for 7 days, THEN 2 tablets (20 mg total) daily with breakfast for 7 days, THEN 1 tablet (10 mg total) daily with breakfast for 7 days. 105 tablet 0   predniSONE (DELTASONE) 5 MG tablet Take 1 tablet (5 mg total) by mouth daily with breakfast for 7 days, THEN 1 tablet (5 mg total) 3 (three) times a week for 7 days, THEN 1 tablet (5 mg total) 2 (two) times a week for 7 days. 12 tablet 0   pregabalin (LYRICA) 75 MG capsule TAKE 1 CAPSULE BY MOUTH TWICE A DAY (Patient taking differently: Take 75 mg by mouth 2 (two) times daily.) 60 capsule 3   PROVENTIL HFA 108 (90 Base) MCG/ACT inhaler TAKE 2 PUFFS BY MOUTH EVERY 6 HOURS AS NEEDED FOR WHEEZE OR SHORTNESS OF BREATH 6.7 each 5   rivaroxaban (XARELTO) 20 MG TABS tablet Take 1 tablet (20 mg total) by mouth daily with supper. 30 tablet 0   sodium chloride (OCEAN) 0.65 % SOLN nasal spray Place 1 spray into both nostrils as needed for congestion. 20 mL 0   sucralfate (CARAFATE) 1 g tablet TAKE 1 TABLET (1 G TOTAL) BY MOUTH 4 (FOUR) TIMES DAILY - WITH MEALS AND AT BEDTIME. 120 tablet 1   tamsulosin (FLOMAX) 0.4 MG CAPS capsule Take 2 capsules (0.8 mg total) by mouth daily. 180 capsule 3   terbinafine (LAMISIL) 250 MG tablet Take 1 tablet (250 mg total) by mouth daily. 3 tablet 0   testosterone cypionate (DEPOTESTOSTERONE CYPIONATE) 200 MG/ML injection INJECT 1 ML (200 MG TOTAL) INTO THE MUSCLE EVERY 14 (FOURTEEN) DAYS. 2 mL 0   tiZANidine (ZANAFLEX) 4 MG tablet Take 1.5 tablets (6 mg total) by mouth every 6 (six) hours as needed for muscle spasms. 540 tablet 1   venlafaxine XR (EFFEXOR-XR) 150 MG 24 hr capsule TAKE 1 CAPSULE (150 MG TOTAL) BY MOUTH DAILY WITH BREAKFAST. 90 capsule 1    HYDROcodone-acetaminophen (NORCO) 7.5-325 MG tablet Take 1 tablet by mouth every 4 (four) hours as needed for up to 7 days for moderate pain ((score 4 to 6)). 40 tablet 0   Current Facility-Administered Medications on File Prior to Visit  Medication Dose Route Frequency Provider Last Rate Last Admin   testosterone cypionate (DEPOTESTOSTERONE CYPIONATE) injection 200 mg  200 mg Intramuscular Q14 Days Masen Salvas, Percell Miller, PA-C   200 mg at 09/11/20 0951    BP 130/70   Pulse (!) 113   Temp 98.1 F (36.7 C)   Resp 20   Ht 5\' 10"  (1.778 m)   Wt 156 lb 8 oz (71 kg)   SpO2 96%   BMI 22.46 kg/m       Objective:   Physical Exam  General Mental Status- Alert. General Appearance- Not in acute distress.   Skin General: Color- Normal Color. Moisture- Normal Moisture.  Neck Carotid Arteries- Normal color. Moisture- Normal Moisture. No carotid bruits. No JVD.  Chest and Lung Exam Auscultation: Breath Sounds:-Normal.  Cardiovascular Auscultation:Rythm- Regular. Murmurs & Other Heart Sounds:Auscultation of the heart reveals- No  Murmurs.  Abdomen Inspection:-Inspeection Normal. Palpation/Percussion:Note:No mass. Palpation and Percussion of the abdomen reveal- Non Tender, Non Distended + BS, no rebound or guarding.   Neurologic Cranial Nerve exam:- CN III-XII intact(No nystagmus), symmetric smile. Strength:- 5/5 equal and symmetric strength both upper and lower extremities.   Lower extremity-no pedal edema and negative Homans' sign.  Calf symmetric.    Assessment & Plan:  For hx of drug induced pneumonitis continue to follow pulmonologist instruction.  Your have mild tachycardia presently on exam.  Shows sinus rhythm.  Low voltage possible pulmonary disease.  I do agree that you have probable deconditioning related to multiple medical problems.  However we will go ahead and refer to cardiologist and get their opinion as well.  Fatigue is likely multifactorial.  Presently I do think  probably B12 deficiency playing a large role.  Continue with weekly B12 vitamin injections and go ahead and start also B12 5000 MCG over-the-counter dose daily.  We will recheck your CBC, iron level and metabolic panel today.  For history of subacute osteomyelitis continue to follow-up with ID.  We will let you know your infection fighting cell count today.  For insomnia would recommend to continue Xanax at night.  For recent intermittent low blood pressures I want you to check your blood pressure various times of the day.  Make sure you stay well-hydrated.  Today's blood pressure is 130/70 which is close to ideal.  Follow-up in 3 to 4 weeks or as needed.  Mackie Pai, PA-C   Time spent with patient today was  41 minutes which consisted of chart revdiew, discussing diagnosis, work up treatment and documentation.

## 2021-01-05 NOTE — Patient Instructions (Signed)
For hx of drug induced pneumonitis continue to follow pulmonologist instruction.  Your have mild tachycardia presently on exam.  Shows sinus rhythm.  Low voltage possible pulmonary disease.  I do agree that you have probable deconditioning related to multiple medical problems.  However we will go ahead and refer to cardiologist and get their opinion as well.  Fatigue is likely multifactorial.  Presently I do think probably B12 deficiency playing a large role.  Continue with weekly B12 vitamin injections and go ahead and start also B12 5000 MCG over-the-counter dose daily.  We will recheck your CBC, iron level and metabolic panel today.  For history of subacute osteomyelitis continue to follow-up with ID.  We will let you know your infection fighting cell count today.  For insomnia would recommend to continue Xanax at night.  For recent intermittent low blood pressures I want you to check your blood pressure various times of the day.  Make sure you stay well-hydrated.  Today's blood pressure is 130/70 which is close to ideal.  Follow-up in 3 to 4 weeks or as needed.

## 2021-01-05 NOTE — Addendum Note (Signed)
Addended by: Anabel Halon on: 01/05/2021 07:12 PM   Modules accepted: Orders

## 2021-01-06 ENCOUNTER — Ambulatory Visit
Admission: RE | Admit: 2021-01-06 | Discharge: 2021-01-06 | Disposition: A | Discharge status: Home / Self Care | Payer: Medicare Other | Source: Ambulatory Visit | Attending: Gastroenterology | Admitting: Gastroenterology

## 2021-01-06 DIAGNOSIS — K838 Other specified diseases of biliary tract: Secondary | ICD-10-CM | POA: Diagnosis not present

## 2021-01-06 MED ORDER — GADOBENATE DIMEGLUMINE 529 MG/ML IV SOLN
14.0000 mL | Freq: Once | INTRAVENOUS | Status: AC | PRN
  Administered 2021-01-06: 14 mL via INTRAVENOUS

## 2021-01-07 ENCOUNTER — Ambulatory Visit: Payer: Medicare Other | Admitting: Specialist

## 2021-01-07 ENCOUNTER — Other Ambulatory Visit (INDEPENDENT_AMBULATORY_CARE_PROVIDER_SITE_OTHER): Payer: Medicare Other

## 2021-01-07 ENCOUNTER — Other Ambulatory Visit: Payer: Self-pay

## 2021-01-07 ENCOUNTER — Ambulatory Visit (INDEPENDENT_AMBULATORY_CARE_PROVIDER_SITE_OTHER): Payer: Medicare Other

## 2021-01-07 DIAGNOSIS — E538 Deficiency of other specified B group vitamins: Secondary | ICD-10-CM

## 2021-01-07 DIAGNOSIS — D649 Anemia, unspecified: Secondary | ICD-10-CM

## 2021-01-07 LAB — CBC WITH DIFFERENTIAL/PLATELET
Basophils Absolute: 0 10*3/uL (ref 0.0–0.1)
Basophils Relative: 0.2 % (ref 0.0–3.0)
Eosinophils Absolute: 0 10*3/uL (ref 0.0–0.7)
Eosinophils Relative: 0.3 % (ref 0.0–5.0)
HCT: 35.4 % — ABNORMAL LOW (ref 39.0–52.0)
Hemoglobin: 11.8 g/dL — ABNORMAL LOW (ref 13.0–17.0)
Lymphocytes Relative: 6.3 % — ABNORMAL LOW (ref 12.0–46.0)
Lymphs Abs: 0.6 10*3/uL — ABNORMAL LOW (ref 0.7–4.0)
MCHC: 33.5 g/dL (ref 30.0–36.0)
MCV: 93.3 fl (ref 78.0–100.0)
Monocytes Absolute: 0.9 10*3/uL (ref 0.1–1.0)
Monocytes Relative: 10.5 % (ref 3.0–12.0)
Neutro Abs: 7.3 10*3/uL (ref 1.4–7.7)
Neutrophils Relative %: 82.7 % — ABNORMAL HIGH (ref 43.0–77.0)
Platelets: 450 10*3/uL — ABNORMAL HIGH (ref 150.0–400.0)
RBC: 3.79 Mil/uL — ABNORMAL LOW (ref 4.22–5.81)
RDW: 21.1 % — ABNORMAL HIGH (ref 11.5–15.5)
WBC: 8.9 10*3/uL (ref 4.0–10.5)

## 2021-01-07 MED ORDER — CYANOCOBALAMIN 1000 MCG/ML IJ SOLN
1000.0000 ug | Freq: Once | INTRAMUSCULAR | Status: AC
  Administered 2021-01-07: 1000 ug via INTRAMUSCULAR

## 2021-01-07 NOTE — Progress Notes (Addendum)
Luis Brown is a 75 y.o. male presents to the office today for B12 :  injections, per physician's orders. Original order: for B12 on lab results from 12-22-2020 Cyanocobalamim (med), 1000 mg/ ml (dose),  IM (route) was administered Left deltoid (location) today. Patient tolerated injection.   Patient next injection due: in one week, appt made Yes next appt 01-14-21 for b 12 # 4 of 4 weekly and tetosterone injection (patiet will bring medication.  Mackie Pai, PA-C

## 2021-01-08 ENCOUNTER — Other Ambulatory Visit: Payer: Self-pay | Admitting: Medical

## 2021-01-08 ENCOUNTER — Encounter: Payer: Self-pay | Admitting: Medical

## 2021-01-11 ENCOUNTER — Ambulatory Visit (INDEPENDENT_AMBULATORY_CARE_PROVIDER_SITE_OTHER): Payer: Medicare Other | Admitting: Internal Medicine

## 2021-01-11 ENCOUNTER — Telehealth (HOSPITAL_COMMUNITY): Payer: Self-pay

## 2021-01-11 ENCOUNTER — Other Ambulatory Visit: Payer: Self-pay

## 2021-01-11 ENCOUNTER — Encounter: Payer: Self-pay | Admitting: Internal Medicine

## 2021-01-11 VITALS — BP 146/82 | HR 93 | Temp 97.6°F

## 2021-01-11 DIAGNOSIS — Z973 Presence of spectacles and contact lenses: Secondary | ICD-10-CM | POA: Insufficient documentation

## 2021-01-11 DIAGNOSIS — J8281 Chronic eosinophilic pneumonia: Secondary | ICD-10-CM | POA: Diagnosis not present

## 2021-01-11 DIAGNOSIS — Z298 Encounter for other specified prophylactic measures: Secondary | ICD-10-CM

## 2021-01-11 DIAGNOSIS — M4624 Osteomyelitis of vertebra, thoracic region: Secondary | ICD-10-CM | POA: Diagnosis not present

## 2021-01-11 NOTE — Telephone Encounter (Signed)
Called patient to see if he is interested in the Pulmonary Rehab Program. Patient expressed interest. Explained scheduling process and went over insurance, patient verbalized understanding. Also adv pt where we are with scheduling for PR and that we have a back log. (1-3 months) 

## 2021-01-11 NOTE — Telephone Encounter (Signed)
Requesting: Alprazolam 0.5mg  Last Visit: 01/05/21 Next Visit: None Last Refill: 12/25/20 #30, 0 RF  PCP out of office this week.

## 2021-01-11 NOTE — Telephone Encounter (Signed)
Pt insurance is active and benefits verified through [facility] Medicare. Co-pay $20.00, DED $0.00/$0.00 met, out of pocket $3,900.00/$1,669.61 met, co-insurance 0%. No pre-authorization required. Luis F./[employer], 01/08/21 @ 3:53PM, HWT#[id number]   Will contact patient to see if he is interested in the Pulmonary Rehab Program.

## 2021-01-11 NOTE — Progress Notes (Signed)
[facility]  CHIEF COMPLAINT:    Follow up for back infection, eosinophilic pneumonia  SUBJECTIVE:    Luis Brown is a 75 y.o. male with PMHx as below who presents to the clinic for follow up back infection and eosinophilic pneumonia.  He previously followed with Dr Tommy Medal but saw me previously on Dec 09, 2020.  At that time he was transitioned off antibiotics as he had received 9 weeks of therapy with daptomycin --> Bactrim with improved pain symptoms and normalization of inflammatory markers.  Additionally, his nidus of infection with hardware removal by Dr Louanne Skye presumably dealt with his infection recognizing that his other remaining hardware could potentially continue to be an issue.  He was continued on PCP prophylaxis with dapsone $RemoveBefo'100mg'sGzxivtGVop$  daily (transitioned from TMP SMX due to lab abnormalities) while on high dose prednisone.  He is currently on $RemoveBefo'5mg'VgsOBmYRRKr$  daily prednisone and saw his pulmonary provider on 12/29/20.  Overall is doing well.  Still dealing with some fatigue after activity.  No fevers, chills.  His back pain is moderate but stable and controlled with pain medications.  He does endorse some sweats off an on for the past 2-3 weeks.  He finishes Prednisone this week.   Please see A&P for the details of today's visit and status of the patient's medical problems.   Patient's Medications  New Prescriptions   No medications on file  Previous Medications   ALPRAZOLAM (XANAX) 0.5 MG TABLET    TAKE 1 TABLET BY MOUTH 2 TIMES DAILY AS NEEDED FOR ANXIETY.   ATORVASTATIN (LIPITOR) 10 MG TABLET    TAKE 1 TABLET BY MOUTH EVERY DAY   B COMPLEX VITAMINS CAPSULE    Take 1 capsule by mouth daily.   BAYER ASPIRIN EC LOW DOSE 81 MG EC TABLET    Take 81 mg by mouth every morning.   BUSPIRONE (BUSPAR) 7.5 MG TABLET    1-2 TAB BY MOUTH 3 TIMES A DAY FOR ANXIETY   CETIRIZINE (ZYRTEC) 10 MG TABLET    Take 10 mg by mouth daily. In the morning   DAPSONE 100 MG  TABLET    Take 1 tablet (100 mg total) by mouth daily.   FAMOTIDINE (PEPCID) 40 MG TABLET    TAKE 1 TABLET BY MOUTH EVERYDAY AT BEDTIME   FERROUS FUMARATE (FERRETTS) 325 (106 FE) MG TABS TABLET    Take 1 tablet (106 mg of iron total) by mouth daily.   GLUCOSE BLOOD (CONTOUR NEXT TEST) TEST STRIP    Check blood sugar once daily   HYDROCODONE-ACETAMINOPHEN (NORCO) 7.5-325 MG TABLET    Take 1 tablet by mouth every 4 (four) hours as needed for up to 7 days for moderate pain ((score 4 to 6)).   JANUVIA 50 MG TABLET    TAKE 1 TABLET BY MOUTH EVERY DAY   METFORMIN (GLUCOPHAGE) 1000 MG TABLET    Take 1 tablet (1,000 mg total) by mouth 2 (two) times daily with a meal.   NYSTATIN CREAM (MYCOSTATIN)    Apply 1 application topically 2 (two) times daily.   OLMESARTAN (BENICAR) 5 MG TABLET    Take 1 tablet (5 mg total) by mouth daily.   PANTOPRAZOLE (PROTONIX) 40 MG TABLET    TAKE 1 TABLET BY MOUTH TWICE A DAY BEFORE A MEAL   PREDNISONE (DELTASONE) 5 MG TABLET    Take 1 tablet (5 mg total) by mouth daily with breakfast for 7 days,  THEN 1 tablet (5 mg total) 3 (three) times a week for 7 days, THEN 1 tablet (5 mg total) 2 (two) times a week for 7 days.   PREGABALIN (LYRICA) 75 MG CAPSULE    TAKE 1 CAPSULE BY MOUTH TWICE A DAY   PROVENTIL HFA 108 (90 BASE) MCG/ACT INHALER    TAKE 2 PUFFS BY MOUTH EVERY 6 HOURS AS NEEDED FOR WHEEZE OR SHORTNESS OF BREATH   RIVAROXABAN (XARELTO) 20 MG TABS TABLET    Take 1 tablet (20 mg total) by mouth daily with supper.   SODIUM CHLORIDE (OCEAN) 0.65 % SOLN NASAL SPRAY    Place 1 spray into both nostrils as needed for congestion.   SUCRALFATE (CARAFATE) 1 G TABLET    TAKE 1 TABLET (1 G TOTAL) BY MOUTH 4 (FOUR) TIMES DAILY - WITH MEALS AND AT BEDTIME.   TAMSULOSIN (FLOMAX) 0.4 MG CAPS CAPSULE    Take 2 capsules (0.8 mg total) by mouth daily.   TERBINAFINE (LAMISIL) 250 MG TABLET    Take 1 tablet (250 mg total) by mouth daily.   TESTOSTERONE CYPIONATE (DEPOTESTOSTERONE CYPIONATE) 200  MG/ML INJECTION    INJECT 1 ML (200 MG TOTAL) INTO THE MUSCLE EVERY 14 (FOURTEEN) DAYS.   TIZANIDINE (ZANAFLEX) 4 MG TABLET    Take 1.5 tablets (6 mg total) by mouth every 6 (six) hours as needed for muscle spasms.   VENLAFAXINE XR (EFFEXOR-XR) 150 MG 24 HR CAPSULE    TAKE 1 CAPSULE (150 MG TOTAL) BY MOUTH DAILY WITH BREAKFAST.  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   Esophageal reflux 02/10/2015   Fatigue    HTN (hypertension)    Hyperglycemia    preDM    Hyperlipidemia    Insomnia    Low back pain radiating to both legs 01/16/2017   Measles as a child   Overweight(278.02)    Peripheral neuropathy 08/07/2019   Personal history of colonic polyps 10/27/2012   Follows with [facility]   Pneumonia    " walking"   Preventative health care    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   Wears glasses     Social History   Tobacco Use   Smoking status: Former    Packs/day: 1.00    Years: 20.00    Pack years: 20.00    Types: Cigarettes    Quit date: 07/18/1989    Years since quitting: 31.5   Smokeless tobacco: Never   Tobacco comments:    off and on smoking for the 20 yrs  Vaping Use   Vaping Use: Never used  Substance Use Topics   Alcohol use: Not Currently    Comment: drink or two a day- mixed drink   Drug use: No    Family History  Problem Relation Age of Onset   Hypertension Mother    Diabetes Mother  type 2   Cancer  Mother 38       breast in remission   Emphysema Father        smoker   COPD Father        smoker   Stroke Father 25       mini   Heart disease Father    Hypertension Sister    Hyperlipidemia Sister    Scoliosis Sister    Osteoporosis Sister    Hypertension Maternal Grandmother    Scoliosis Maternal Grandmother    Heart disease Maternal Grandfather    Heart disease Paternal Grandfather        smoker   Heart disease Daughter     No Known Allergies  Review of Systems  All other systems reviewed and are negative. Except as noted in HPI.  OBJECTIVE:    Vitals:   01/11/21 1518  BP: (!) 146/82  Pulse: 93  Temp: 97.6 F (36.4 C)  TempSrc: Oral   There is no height or weight on file to calculate BMI.  Physical Exam Constitutional:      General: He is not in acute distress.    Appearance: Normal appearance.  HENT:     Head: Normocephalic and atraumatic.  Eyes:     Extraocular Movements: Extraocular movements intact.     Conjunctiva/sclera: Conjunctivae normal.  Pulmonary:     Effort: Pulmonary effort is normal. No respiratory distress.     Breath sounds: Wheezing present.  Musculoskeletal:        General: Normal range of motion.  Skin:    General: Skin is warm and dry.     Findings: No rash.  Neurological:     General: No focal deficit present.     Mental Status: He is alert and oriented to person, place, and time.  Psychiatric:        Mood and Affect: Mood normal.        Behavior: Behavior normal.     Labs and Microbiology: CBC Latest Ref Rng & Units 01/07/2021 01/05/2021 12/21/2020  WBC 4.0 - 10.5 K/uL 8.9 7.7 9.5  Hemoglobin 13.0 - 17.0 g/dL 11.8(L) 11.1(L) 13.0  Hematocrit 39.0 - 52.0 % 35.4(L) 33.3(L) 39.3  Platelets 150.0 - 400.0 K/uL 450.0(H) 367.0 287.0   CMP Latest Ref Rng & Units 01/05/2021 12/21/2020 12/09/2020  Glucose 70 - 99 mg/dL 187(H) 243(H) 378(H)  BUN 6 - 23 mg/dL $Remove'16 15 18  'kSEPMjl$ Creatinine 0.40 - 1.50 mg/dL 0.56 0.67 0.75  Sodium 135 - 145 mEq/L  134(L) 129(L) 121(L)  Potassium 3.5 - 5.1 mEq/L 4.5 4.9 5.0  Chloride 96 - 112 mEq/L 95(L) 89(L) 83(L)  CO2 19 - 32 mEq/L $Remove'31 28 29  'RBOnuTC$ Calcium 8.4 - 10.5 mg/dL 9.3 9.1 9.5  Total Protein 6.0 - 8.3 g/dL 5.9(L) 5.7(L) -  Total Bilirubin 0.2 - 1.2 mg/dL 0.9 1.6(H) -  Alkaline Phos 39 - 117 U/L 65 59 -  AST 0 - 37 U/L 15 9 -  ALT 0 - 53 U/L 21 7 -        ASSESSMENT & PLAN:    1. 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HCC) Will continue to monitor off antibiotics as his back pain continues to be stable.  Recent inflammatory markers were normal in late May and CBC/CMP stable last week.  Will check ESR/CRP today.  2. 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  Continues to follow up with pulmonary.  3. Need for pneumocystis prophylaxis  Will continue dapsone as is while on prednisone and for 3 additional weeks after he completes steroids.  RTC 4-6 weeks for repeat inflammatory markers off antibiotics and reassess need for PCP prophylaxis.    [facility] 01/11/2021, 3:43 PM

## 2021-01-11 NOTE — Patient Instructions (Signed)
Thank you for coming to see me today. It was a pleasure seeing you.  To Do: Continue dapsone until finished with current 30-day supply.  This should last you about 3 weeks after you finish prednisone Labs today Follow up in 4-5 weeks  If you have any questions or concerns, please do not hesitate to call the office at [phone].  Take Care,   Jule Ser, DO

## 2021-01-12 ENCOUNTER — Telehealth: Payer: Self-pay

## 2021-01-12 DIAGNOSIS — I82409 Acute embolism and thrombosis of unspecified deep veins of unspecified lower extremity: Secondary | ICD-10-CM | POA: Diagnosis not present

## 2021-01-12 DIAGNOSIS — J9601 Acute respiratory failure with hypoxia: Secondary | ICD-10-CM | POA: Diagnosis not present

## 2021-01-12 DIAGNOSIS — M5137 Other intervertebral disc degeneration, lumbosacral region: Secondary | ICD-10-CM | POA: Diagnosis not present

## 2021-01-12 DIAGNOSIS — E785 Hyperlipidemia, unspecified: Secondary | ICD-10-CM | POA: Diagnosis not present

## 2021-01-12 DIAGNOSIS — Z7982 Long term (current) use of aspirin: Secondary | ICD-10-CM | POA: Diagnosis not present

## 2021-01-12 DIAGNOSIS — G4700 Insomnia, unspecified: Secondary | ICD-10-CM | POA: Diagnosis not present

## 2021-01-12 DIAGNOSIS — E1142 Type 2 diabetes mellitus with diabetic polyneuropathy: Secondary | ICD-10-CM | POA: Diagnosis not present

## 2021-01-12 DIAGNOSIS — Z7984 Long term (current) use of oral hypoglycemic drugs: Secondary | ICD-10-CM | POA: Diagnosis not present

## 2021-01-12 DIAGNOSIS — I10 Essential (primary) hypertension: Secondary | ICD-10-CM | POA: Diagnosis not present

## 2021-01-12 DIAGNOSIS — E871 Hypo-osmolality and hyponatremia: Secondary | ICD-10-CM | POA: Diagnosis not present

## 2021-01-12 LAB — SEDIMENTATION RATE: Sed Rate: 17 mm/h (ref 0–20)

## 2021-01-12 LAB — C-REACTIVE PROTEIN: CRP: 34.1 mg/L — ABNORMAL HIGH (ref <8.0)

## 2021-01-12 NOTE — Telephone Encounter (Signed)
-----  Message from Mignon Pine, DO sent at 01/12/2021  9:35 AM EDT ----- Please let patient know that inflammatory markers are discordant with ESR being normal and CRP being up a little bit.  This is relatively non-specific and since his back pain is stable, I would recommend we monitor with repeat inflammatory markers next month unless he develops worsening pain or new fevers/signs of infection.

## 2021-01-12 NOTE — Telephone Encounter (Signed)
Spoke with patient, advised him that Dr. Juleen China reviewed his lab results and his inflammatory markers are discordant with his ESR being normal and CRP slightly elevated. Advised him that this is relatively non-specific and that Dr. Juleen China will monitor these labs again at his follow up appointment. Instructed patient to call us and notify us of any worsening pain, new fever, or other signs of infection. Patient verbalized understanding and states he will notify us of any changes.   Beryle Flock, RN

## 2021-01-14 ENCOUNTER — Other Ambulatory Visit: Payer: Self-pay

## 2021-01-14 ENCOUNTER — Ambulatory Visit (INDEPENDENT_AMBULATORY_CARE_PROVIDER_SITE_OTHER): Payer: Medicare Other

## 2021-01-14 ENCOUNTER — Other Ambulatory Visit: Payer: Self-pay | Admitting: Medical

## 2021-01-14 DIAGNOSIS — E538 Deficiency of other specified B group vitamins: Secondary | ICD-10-CM

## 2021-01-14 DIAGNOSIS — E349 Endocrine disorder, unspecified: Secondary | ICD-10-CM | POA: Diagnosis not present

## 2021-01-14 MED ORDER — CYANOCOBALAMIN 1000 MCG/ML IJ SOLN
1000.0000 ug | Freq: Once | INTRAMUSCULAR | Status: AC
  Administered 2021-01-14: 1000 ug via INTRAMUSCULAR

## 2021-01-14 NOTE — Progress Notes (Addendum)
Luis Brown is a 76 y.o. male presents to the office today for b12 and tesosteroneinjections, per physician's orders. Original order: b12 12/24/2020, testosterone pt has been on for several years cyanocobalamin (med), 1,000 mcg (dose),  IM (route) was administered right deltoid (location) today. Patient tolerated injection. Testosterone Cypionate (med), 200 mg/mL (dose), IM (route) administered right upper outter quadrant. Patient tolerated well.  Patient due for follow up labs/provider appt: No. Patient next injection due: one month, appt made No  [name]  Routed to DOD in absence of PCP.

## 2021-01-15 NOTE — Telephone Encounter (Addendum)
Requesting: testosterone 200mg /mL Contract: n/a UDS: n/a Last Visit: 01/05/2021 Next Visit: None Last Refill: 12/11/2020 #61mL and 0RF  Please Advise Refilled rx.  Mackie Pai, PA-C

## 2021-01-19 DIAGNOSIS — G894 Chronic pain syndrome: Secondary | ICD-10-CM | POA: Diagnosis not present

## 2021-01-19 DIAGNOSIS — M150 Primary generalized (osteo)arthritis: Secondary | ICD-10-CM | POA: Diagnosis not present

## 2021-01-19 DIAGNOSIS — M961 Postlaminectomy syndrome, not elsewhere classified: Secondary | ICD-10-CM | POA: Diagnosis not present

## 2021-01-19 DIAGNOSIS — Z79891 Long term (current) use of opiate analgesic: Secondary | ICD-10-CM | POA: Diagnosis not present

## 2021-01-19 DIAGNOSIS — M4628 Osteomyelitis of vertebra, sacral and sacrococcygeal region: Secondary | ICD-10-CM | POA: Diagnosis not present

## 2021-01-20 ENCOUNTER — Ambulatory Visit: Payer: Medicare Other | Admitting: Cardiology

## 2021-01-20 ENCOUNTER — Encounter: Payer: Self-pay | Admitting: Cardiology

## 2021-01-20 ENCOUNTER — Other Ambulatory Visit: Payer: Self-pay | Admitting: Family Medicine

## 2021-01-20 ENCOUNTER — Other Ambulatory Visit: Payer: Self-pay

## 2021-01-20 ENCOUNTER — Ambulatory Visit (INDEPENDENT_AMBULATORY_CARE_PROVIDER_SITE_OTHER): Payer: Medicare Other

## 2021-01-20 VITALS — BP 120/70 | HR 166 | Ht 70.0 in | Wt 163.0 lb

## 2021-01-20 DIAGNOSIS — I480 Paroxysmal atrial fibrillation: Secondary | ICD-10-CM | POA: Diagnosis not present

## 2021-01-20 DIAGNOSIS — E782 Mixed hyperlipidemia: Secondary | ICD-10-CM | POA: Diagnosis not present

## 2021-01-20 DIAGNOSIS — I10 Essential (primary) hypertension: Secondary | ICD-10-CM

## 2021-01-20 DIAGNOSIS — R0602 Shortness of breath: Secondary | ICD-10-CM | POA: Diagnosis not present

## 2021-01-20 MED ORDER — DILTIAZEM HCL ER COATED BEADS 120 MG PO CP24: 120.0000 mg | ORAL_CAPSULE | Freq: Every day | ORAL | 3 refills | Status: DC

## 2021-01-20 MED ORDER — RIVAROXABAN 20 MG PO TABS: 20.0000 mg | ORAL_TABLET | Freq: Every day | ORAL | 3 refills | Status: DC

## 2021-01-20 NOTE — Progress Notes (Signed)
Cardiology Office Note:    Date:  01/20/2021   ID:  Luis Brown, DOB [birth year], MRN [id number]  PCP:  Luis Pai, PA-C  Cardiologist:  Berniece Salines, DO  Electrophysiologist:  None   Referring MD: Luis Pai, PA-C   " I am just so short of breath and fatigue"   History of Present Illness:    Luis Brown is a 75 y.o. male with a hx of diabetes mellitus, hypertension, hyperlipidemia is here today for follow-up visit.  The patient was last seen in our practice in January 2022 at that time he presented for preoperative valuation prior to his surgery. The patient presents as he was asked by his PCP again to see cardiology as he has been experiencing intermittent shortness of breath, some dizziness and fatigue.  At first he thought it was because he had pneumonia in April but this has been going on now for a while.  No chest pain he is here today with his wife.  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   Esophageal reflux 02/10/2015   Fatigue    HTN (hypertension)    Hyperglycemia    preDM    Hyperlipidemia    Insomnia    Low back pain radiating to both legs 01/16/2017   Measles as a child   Overweight(278.02)    Peripheral neuropathy 08/07/2019   Personal history of colonic polyps 10/27/2012   Follows with [facility]   Pneumonia    " walking"   Preventative health care    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   Wears glasses     Past Surgical History:  Procedure Laterality Date   BACK SURGERY  2012 and 1994   Dr  Margret Chance, screws and cage in low back   BIOPSY  01/09/2020   Procedure: BIOPSY;  Surgeon: Otis Brace, MD;  Location: [facility];  Service: Gastroenterology;;   BRONCHIAL BIOPSY  11/17/2020   Procedure: BRONCHIAL BIOPSIES;  Surgeon: Laurin Coder, MD;  Location: [facility];  Service: Endoscopy;;   BRONCHIAL BRUSHINGS  11/17/2020   Procedure: BRONCHIAL BRUSHINGS;  Surgeon: Laurin Coder, MD;  Location: [facility];  Service: Endoscopy;;   BRONCHIAL WASHINGS  11/17/2020   Procedure: BRONCHIAL WASHINGS;  Surgeon: Laurin Coder, MD;  Location: [facility];  Service: Endoscopy;;   COLONOSCOPY WITH PROPOFOL N/A 01/09/2020   Procedure: COLONOSCOPY WITH PROPOFOL;  Surgeon: Otis Brace, MD;  Location: [facility];  Service: Gastroenterology;  Laterality: N/A;   ESOPHAGOGASTRODUODENOSCOPY (EGD) WITH PROPOFOL N/A 01/09/2020   Procedure: ESOPHAGOGASTRODUODENOSCOPY (EGD) WITH PROPOFOL;  Surgeon: Otis Brace, MD;  Location: [facility];  Service: Gastroenterology;  Laterality: N/A;   EYE SURGERY Bilateral    2016   HARDWARE REVISION  03/12/2019   REVISION OF HARDWARE THORACIC TEN-THORACIC ELEVEN WITH APPLICATION OF ADDITIONAL RODS AND ROD SLEEVES THORACIC EIGHT-THORACIC TWELVE 12-LUMBAR ONE (N/A )   HEMOSTASIS CONTROL  11/17/2020   Procedure: HEMOSTASIS CONTROL;  Surgeon: Laurin Coder, MD;  Location: [facility];  Service: Endoscopy;;   IR US GUIDE BX ASP/DRAIN  11/13/2020   LAMINECTOMY  02/12/2019   DECOMPRESSIVE LAMINECTOMY THORACIC NINE-THORACIC TEN AND THORACIC TEN-THORACIC ELEVEN, EXTENSION OF THORACOLUMBAR FUSION FROM THORACIC TEN TO THORACIC FIVE, LOCAL BONE GRAFT, ALLOGRAFT AND VIVIGEN (N/A)   POLYPECTOMY  01/09/2020   Procedure: POLYPECTOMY;  Surgeon: Otis Brace, MD;  Location: [facility];  Service: Gastroenterology;;   REMOVAL OF RODS AND PEDICLE SCREWS T5 AND T6, EXPLORATION OF FUSION, BIOPSY TRANSPEDICULAR T5 AND T6 (N/A )  09/14/2020   SPINAL FUSION  N/A 02/12/2019   Procedure: DECOMPRESSIVE LAMINECTOMY THORACIC NINE-THORACIC TEN AND THORACIC TEN-THORACIC ELEVEN, EXTENSION OF THORACOLUMBAR FUSION FROM THORACIC TEN TO THORACIC FIVE, LOCAL BONE GRAFT, ALLOGRAFT AND VIVIGEN;  Surgeon: Jessy Oto, MD;  Location: [place];  Service: Orthopedics;  Laterality: N/A;  DECOMPRESSIVE LAMINECTOMY THORACIC NINE-THORACIC TEN AND THORACIC TEN-THORACIC ELEVEN, EXTENSION OF TH   SPINAL FUSION N/A 03/12/2019   Procedure: REVISION OF HARDWARE THORACIC TEN-THORACIC ELEVEN WITH APPLICATION OF ADDITIONAL RODS AND ROD SLEEVES THORACIC EIGHT-THORACIC TWELVE 12-LUMBAR ONE;  Surgeon: Jessy Oto, MD;  Location: [place];  Service: Orthopedics;  Laterality: N/A;   TONSILLECTOMY     as child   torn rotator cuff  2010   right   VIDEO BRONCHOSCOPY N/A 11/17/2020   Procedure: VIDEO BRONCHOSCOPY WITH FLUORO;  Surgeon: Laurin Coder, MD;  [facility]: [facility];  Service: Endoscopy;  Laterality: N/A;    Current Medications: Current Meds  Medication Sig   atorvastatin (LIPITOR) 10 MG tablet TAKE 1 TABLET BY MOUTH EVERY DAY   BAYER ASPIRIN EC LOW DOSE 81 MG EC tablet Take 81 mg by mouth every morning.   busPIRone (BUSPAR) 7.5 MG tablet 1-2 TAB BY MOUTH 3 TIMES A DAY FOR ANXIETY (Patient taking differently: Take 7.5 mg by mouth 2 (two) times daily.)   cetirizine (ZYRTEC) 10 MG tablet Take 10 mg by mouth daily. In the morning   dapsone 100 MG tablet Take 1 tablet (100 mg total) by mouth daily.   ferrous fumarate (FERRETTS) 325 (106 Fe) MG TABS tablet Take 1 tablet (106 mg of iron total) by mouth daily.   glucose blood (CONTOUR NEXT TEST) test strip Check blood sugar once daily   HYDROcodone-acetaminophen (NORCO) 7.5-325 MG tablet Take 1 tablet by mouth every 4 (four) hours as needed for up to 7 days for moderate pain ((score 4 to 6)).   JANUVIA 50 MG tablet TAKE 1 TABLET BY MOUTH EVERY DAY   metFORMIN (GLUCOPHAGE) 1000 MG tablet Take 1 tablet (1,000 mg total) by mouth 2  (two) times daily with a meal.   pantoprazole (PROTONIX) 40 MG tablet TAKE 1 TABLET BY MOUTH TWICE A DAY BEFORE A MEAL   pregabalin (LYRICA) 75 MG capsule TAKE 1 CAPSULE BY MOUTH TWICE A DAY (Patient taking differently: Take 75 mg by mouth 2 (two) times daily.)   PROVENTIL HFA 108 (90 Base) MCG/ACT inhaler TAKE 2 PUFFS BY MOUTH EVERY 6 HOURS AS NEEDED FOR WHEEZE OR SHORTNESS OF BREATH   rivaroxaban (XARELTO) 20 MG TABS tablet Take 1 tablet (20 mg total) by mouth daily with supper.   sucralfate (CARAFATE) 1 g tablet TAKE 1 TABLET (1 G TOTAL) BY MOUTH 4 (FOUR) TIMES DAILY - WITH MEALS AND AT BEDTIME.   tamsulosin (FLOMAX) 0.4 MG CAPS capsule Take 2 capsules (0.8 mg total) by mouth daily.   testosterone cypionate (DEPOTESTOSTERONE CYPIONATE) 200 MG/ML injection INJECT 1 ML (200 MG TOTAL) INTO THE MUSCLE EVERY 14 (FOURTEEN)  DAYS.   tiZANidine (ZANAFLEX) 4 MG tablet Take 1.5 tablets (6 mg total) by mouth every 6 (six) hours as needed for muscle spasms.   venlafaxine XR (EFFEXOR-XR) 150 MG 24 hr capsule TAKE 1 CAPSULE (150 MG TOTAL) BY MOUTH DAILY WITH BREAKFAST.   Current Facility-Administered Medications for the 01/20/21 encounter (Office Visit) with Berniece Salines, DO  Medication   testosterone cypionate (DEPOTESTOSTERONE CYPIONATE) injection 200 mg     Allergies:   Patient has no known allergies.   Social History   Socioeconomic History   Marital status: Married    Spouse name: Not on file   Number of children: Not on file   Years of education: Not on file   Highest education level: Not on file  Occupational History   Not on file  Tobacco Use   Smoking status: Former    Packs/day: 1.00    Years: 20.00    Pack years: 20.00    Types: Cigarettes    Quit date: 07/18/1989    Years since quitting: 31.5   Smokeless tobacco: Never   Tobacco comments:    off and on smoking for the 20 yrs  Vaping Use   Vaping Use: Never used  Substance and Sexual Activity   Alcohol use: Not Currently     Comment: drink or two a day- mixed drink   Drug use: No   Sexual activity: Yes    Comment: lives with wife, still working, no dietary restrictions, continues to exercise intermittently  Other Topics Concern   Not on file  Social History Narrative   Not on file   Social Determinants of Health   Financial Resource Strain: Not on file  Food Insecurity: Not on file  Transportation Needs: Not on file  Physical Activity: Not on file  Stress: Not on file  Social Connections: Not on file     Family History: The patient's family history includes COPD in his father; Cancer (age of onset: 53) in his mother; Diabetes in his mother; Emphysema in his father; Heart disease in his daughter, father, maternal grandfather, and paternal grandfather; Hyperlipidemia in his sister; Hypertension in his maternal grandmother, mother, and sister; Osteoporosis in his sister; Scoliosis in his maternal grandmother and sister; Stroke (age of onset: 1) in his father.  ROS:   Review of Systems  Constitution: Negative for decreased appetite, fever and weight gain.  HENT: Negative for congestion, ear discharge, hoarse voice and sore throat.   Eyes: Negative for discharge, redness, vision loss in right eye and visual halos.  Cardiovascular: Negative for chest pain, dyspnea on exertion, leg swelling, orthopnea and palpitations.  Respiratory: Negative for cough, hemoptysis, shortness of breath and snoring.   Endocrine: Negative for heat intolerance and polyphagia.  Hematologic/Lymphatic: Negative for bleeding problem. Does not bruise/bleed easily.  Skin: Negative for flushing, nail changes, rash and suspicious lesions.  Musculoskeletal: Negative for arthritis, joint pain, muscle cramps, myalgias, neck pain and stiffness.  Gastrointestinal: Negative for abdominal pain, bowel incontinence, diarrhea and excessive appetite.  Genitourinary: Negative for decreased libido, genital sores and incomplete emptying.   Neurological: Negative for brief paralysis, focal weakness, headaches and loss of balance.  Psychiatric/Behavioral: Negative for altered mental status, depression and suicidal ideas.  Allergic/Immunologic: Negative for HIV exposure and persistent infections.    EKGs/Labs/Other Studies Reviewed:    The following studies were reviewed today:   EKG:  The ekg ordered today demonstrates atrial fibrillation with rapid ventricular rate heart rate 166 bpm.  Transthoracic echocardiogram April 27,2022  IMPRESSIONS     1. Left ventricular ejection fraction, by estimation, is 60 to 65%. The  left ventricle has normal function. The left ventricle has no regional  wall motion abnormalities. Left ventricular diastolic parameters were  normal.   2. Right ventricular systolic function is normal. The right ventricular  size is mildly enlarged.   3. The mitral valve is normal in structure. No evidence of mitral valve  regurgitation.   4. The aortic valve was not well visualized. Aortic valve regurgitation  is not visualized. No aortic stenosis is present.   5. The inferior vena cava is normal in size with greater than 50%  respiratory variability, suggesting right atrial pressure of 3 mmHg.   Conclusion(s)/Recommendation(s): No vegetation seen. If high clinical  suspicion for endocarditis consider TEE.   FINDINGS   Left Ventricle: Left ventricular ejection fraction, by estimation, is 60  to 65%. The left ventricle has normal function. The left ventricle has no  regional wall motion abnormalities. The left ventricular internal cavity  size was normal in size. There is   no left ventricular hypertrophy. Left ventricular diastolic parameters  were normal.   Right Ventricle: The right ventricular size is mildly enlarged. No  increase in right ventricular wall thickness. Right ventricular systolic  function is normal.   Left Atrium: Left atrial size was normal in size.   Right Atrium: Right  atrial size was normal in size.   Pericardium: Trivial pericardial effusion is present.   Mitral Valve: The mitral valve is normal in structure. No evidence of  mitral valve regurgitation.   Tricuspid Valve: The tricuspid valve is normal in structure. Tricuspid  valve regurgitation is not demonstrated.   Aortic Valve: The aortic valve was not well visualized. Aortic valve  regurgitation is not visualized. No aortic stenosis is present.   Pulmonic Valve: The pulmonic valve was not well visualized. Pulmonic valve  regurgitation is not visualized.   Aorta: The aortic root is normal in size and structure.   Venous: The inferior vena cava is normal in size with greater than 50%  respiratory variability, suggesting right atrial pressure of 3 mmHg.   IAS/Shunts: The interatrial septum was not well visualized.  Recent Labs: 11/10/2020: B Natriuretic Peptide 68.6 11/21/2020: Magnesium 2.2 12/31/2020: TSH 2.36 01/05/2021: ALT 21; BUN 16; Creatinine, Ser 0.56; Potassium 4.5; Sodium 134 01/07/2021: Hemoglobin 11.8; Platelets 450.0  Recent Lipid Panel    Component Value Date/Time   CHOL 112 07/01/2020 0857   TRIG 66.0 07/01/2020 0857   HDL 56.10 07/01/2020 0857   CHOLHDL 2 07/01/2020 0857   VLDL 13.2 07/01/2020 0857   LDLCALC 42 07/01/2020 0857   LDLDIRECT 64.0 10/30/2017 1502    Physical Exam:    VS:  BP 120/70 (BP Location: Left Arm, Patient Position: Sitting, Cuff Size: Normal)   Pulse (!) 166   Ht 5\' 10"  (1.778 m)   Wt 163 lb (73.9 kg)   SpO2 97%   BMI 23.39 kg/m     Wt Readings from Last 3 Encounters:  01/20/21 163 lb (73.9 kg)  01/05/21 156 lb 8 oz (71 kg)  12/29/20 156 lb 9.6 oz (71 kg)     GEN: Well nourished, well developed in no acute distress HEENT: Normal NECK: No JVD; No carotid bruits LYMPHATICS: No lymphadenopathy CARDIAC: S1S2 noted,RRR, no murmurs, rubs, gallops RESPIRATORY:  Clear to auscultation without rales, wheezing or rhonchi  ABDOMEN: Soft,  non-tender, non-distended, +bowel sounds, no guarding. EXTREMITIES: No edema, No cyanosis, no clubbing  MUSCULOSKELETAL:  No deformity  SKIN: Warm and dry NEUROLOGIC:  Alert and oriented x 3, non-focal PSYCHIATRIC:  Normal affect, good insight  ASSESSMENT:    1. PAF (paroxysmal atrial fibrillation) ([place])   2. Hypertension, unspecified type   3. Mixed hyperlipidemia    PLAN:    I spoke to the patient today about his new diagnosis of atrial fibrillation which may be the reason for his shortness of breath and fatigue.  He is with rapid ventricular rate today the goal will be rate control going to start him on low dose calcium channel blocker with Cardizem 120 mg daily.  I explained to the patient and his wife the treatment strategy for A. fib which includes rate control, rhythm control as well as stroke prevention.  His CHA2DS2-VASc score is 40 (age, hypertension, diabetes) making his risk of stroke 4.8 %/year.  I did talk to the patient he is agreeable to start Xarelto 20 mg daily for stroke prevention.  He had previously been on this medication for DVT but has stopped it.  He is willing to get back on it.  I have educated patient on the side effects of this medication. I also urge the patient to abstain from any taking behaviors.  The patient understands that he is now at a high risk of bleeding due to being on anticoagulation.  He  was also advised that if he ever falls and especially hit his  head to be seen in the emergency department.  I like to see the patient back in 6 weeks should he still be in atrial fibrillation at that time we will plan her TEE cardioversion.  His blood pressure is acceptable.  To understand his A. fib burden as well as excursions of his heart rate I am going to place a monitor on the patient.  The patient is in agreement with the above plan. The patient left the office in stable condition.  The patient will follow up in 6 weeks or sooner if needed.   Medication  Adjustments/Labs and Tests Ordered: Current medicines are reviewed at length with the patient today.  Concerns regarding medicines are outlined above.  No orders of the defined types were placed in this encounter.  No orders of the defined types were placed in this encounter.   Patient Instructions  Medication Instructions:  Your physician has recommended you make the following change in your medication:  START: Cardizem 120 mg once daily START: Xeralto 20 mg once daily  *If you need a refill on your cardiac medications before your next appointment, please call your pharmacy*   Lab Work: None If you have labs (blood work) drawn today and your tests are completely normal, you will receive your results only by: [place] (if you have MyChart) OR A paper copy in the mail If you have any lab test that is abnormal or we need to change your treatment, we will call you to review the results.   Testing/Procedures: A zio monitor was ordered today. It will remain on for 14 days. You will then return monitor and event diary in provided box. It takes 1-2 weeks for report to be downloaded and returned to [place]. We will call you with the results. If monitor falls off or has orange flashing light, please call Zio for further instructions.     Follow-Up: At [facility], you and your health needs are our priority.  As part of our continuing mission to provide you with exceptional heart care,  we have created designated Provider Care Teams.  These Care Teams include your primary Cardiologist (physician) and Advanced Practice Providers (APPs -  Physician Assistants and Nurse Practitioners) who all work together to provide you with the care you need, when you need it.  We recommend signing up for the patient portal called "MyChart".  Sign up information is provided on this After Visit Summary.  MyChart is used to connect with patients for Virtual Visits (Telemedicine).  Patients are able to view  lab/test results, encounter notes, upcoming appointments, etc.  Non-urgent messages can be sent to your provider as well.   To learn more about what you can do with MyChart, go to [email or web address].    Your next appointment:   6 week(s)  The format for your next appointment:   In Person  Provider:   Berniece Salines, DO   Other Instructions ZIO  WHY IS MY DOCTOR PRESCRIBING ZIO? The Zio system is proven and trusted by physicians to detect and diagnose irregular heart rhythms -- and has been prescribed to hundreds of thousands of patients.  The FDA has cleared the Zio system to monitor for many different kinds of irregular heart rhythms. In a study, physicians were able to reach a diagnosis 90% of the time with the Zio system1.  You can wear the Zio monitor -- a small, discreet, comfortable patch -- during your normal day-to-day activity, including while you sleep, shower, and exercise, while it records every single heartbeat for analysis.  1Barrett, P., et al. Comparison of 24 Hour Holter Monitoring Versus 14 Day Novel Adhesive Patch Electrocardiographic Monitoring. [place], 2014.  ZIO VS. HOLTER MONITORING The Zio monitor can be comfortably worn for up to 14 days. Holter monitors can be worn for 24 to 48 hours, limiting the time to record any irregular heart rhythms you may have. Zio is able to capture data for the 51% of patients who have their first symptom-triggered arrhythmia after 48 hours.1  LIVE WITHOUT RESTRICTIONS The Zio ambulatory cardiac monitor is a small, unobtrusive, and water-resistant patch--you might even forget you're wearing it. The Zio monitor records and stores every beat of your heart, whether you're sleeping, working out, or showering. Remove on: July 20th 2022    Adopting a Healthy Lifestyle.  Know what a healthy weight is for you (roughly BMI <25) and aim to maintain this   Aim for 7+ servings of fruits and vegetables daily    65-80+ fluid ounces of water or unsweet tea for healthy kidneys   Limit to max 1 drink of alcohol per day; avoid smoking/tobacco   Limit animal fats in diet for cholesterol and heart health - choose grass fed whenever available   Avoid highly processed foods, and foods high in saturated/trans fats   Aim for low stress - take time to unwind and care for your mental health   Aim for 150 min of moderate intensity exercise weekly for heart health, and weights twice weekly for bone health   Aim for 7-9 hours of sleep daily   When it comes to diets, agreement about the perfect plan isnt easy to find, even among the experts. Experts at the [employer] developed an idea known as the Healthy Eating Plate. Just imagine a plate divided into logical, healthy portions.   The emphasis is on diet quality:   Load up on vegetables and fruits - one-half of your plate: Aim for color and variety, and remember that potatoes dont count.  Go for whole grains - one-quarter of your plate: Whole wheat, barley, wheat berries, quinoa, oats, brown rice, and foods made with them. If you want pasta, go with whole wheat pasta.   Protein power - one-quarter of your plate: Fish, chicken, beans, and nuts are all healthy, versatile protein sources. Limit red meat.   The diet, however, does go beyond the plate, offering a few other suggestions.   Use healthy plant oils, such as olive, canola, soy, corn, sunflower and peanut. Check the labels, and avoid partially hydrogenated oil, which have unhealthy trans fats.   If youre thirsty, drink water. Coffee and tea are good in moderation, but skip sugary drinks and limit milk and dairy products to one or two daily servings.   The type of carbohydrate in the diet is more important than the amount. Some sources of carbohydrates, such as vegetables, fruits, whole grains, and beans-are healthier than others.   Finally, stay active  Signed, Berniece Salines, DO  01/20/2021 2:19 PM    [facility]

## 2021-01-20 NOTE — Patient Instructions (Signed)
Medication Instructions:  Your physician has recommended you make the following change in your medication:  START: Cardizem 120 mg once daily START: Xeralto 20 mg once daily  *If you need a refill on your cardiac medications before your next appointment, please call your pharmacy*   Lab Work: None If you have labs (blood work) drawn today and your tests are completely normal, you will receive your results only by: [place] (if you have MyChart) OR A paper copy in the mail If you have any lab test that is abnormal or we need to change your treatment, we will call you to review the results.   Testing/Procedures: A zio monitor was ordered today. It will remain on for 14 days. You will then return monitor and event diary in provided box. It takes 1-2 weeks for report to be downloaded and returned to [place]. We will call you with the results. If monitor falls off or has orange flashing light, please call Zio for further instructions.     Follow-Up: At [facility], you and your health needs are our priority.  As part of our continuing mission to provide you with exceptional heart care, we have created designated Provider Care Teams.  These Care Teams include your primary Cardiologist (physician) and Advanced Practice Providers (APPs -  Physician Assistants and Nurse Practitioners) who all work together to provide you with the care you need, when you need it.  We recommend signing up for the patient portal called "MyChart".  Sign up information is provided on this After Visit Summary.  MyChart is used to connect with patients for Virtual Visits (Telemedicine).  Patients are able to view lab/test results, encounter notes, upcoming appointments, etc.  Non-urgent messages can be sent to your provider as well.   To learn more about what you can do with MyChart, go to [email or web address].    Your next appointment:   6 week(s)  The format for your next appointment:   In  Person  Provider:   Berniece Salines, DO   Other Instructions ZIO  WHY IS MY DOCTOR PRESCRIBING ZIO? The Zio system is proven and trusted by physicians to detect and diagnose irregular heart rhythms -- and has been prescribed to hundreds of thousands of patients.  The FDA has cleared the Zio system to monitor for many different kinds of irregular heart rhythms. In a study, physicians were able to reach a diagnosis 90% of the time with the Zio system1.  You can wear the Zio monitor -- a small, discreet, comfortable patch -- during your normal day-to-day activity, including while you sleep, shower, and exercise, while it records every single heartbeat for analysis.  1Barrett, P., et al. Comparison of 24 Hour Holter Monitoring Versus 14 Day Novel Adhesive Patch Electrocardiographic Monitoring. Ferriday, 2014.  ZIO VS. HOLTER MONITORING The Zio monitor can be comfortably worn for up to 14 days. Holter monitors can be worn for 24 to 48 hours, limiting the time to record any irregular heart rhythms you may have. Zio is able to capture data for the 51% of patients who have their first symptom-triggered arrhythmia after 48 hours.1  LIVE WITHOUT RESTRICTIONS The Zio ambulatory cardiac monitor is a small, unobtrusive, and water-resistant patch--you might even forget you're wearing it. The Zio monitor records and stores every beat of your heart, whether you're sleeping, working out, or showering. Remove on: July 20th 2022

## 2021-01-21 ENCOUNTER — Telehealth: Payer: Self-pay | Admitting: Medical

## 2021-01-21 NOTE — Telephone Encounter (Signed)
Copied from [place] [id number]. Topic: Medicare AWV >> Jan 21, 2021 10:45 AM Harris-Coley, Hannah Beat wrote: Reason for CRM: Left message for patient to schedule Annual Wellness Visit.  Please schedule with Health Nurse Advisor Augustine Radar. at [facility].

## 2021-01-22 ENCOUNTER — Telehealth: Payer: Self-pay

## 2021-01-22 DIAGNOSIS — Z7984 Long term (current) use of oral hypoglycemic drugs: Secondary | ICD-10-CM | POA: Diagnosis not present

## 2021-01-22 DIAGNOSIS — M5137 Other intervertebral disc degeneration, lumbosacral region: Secondary | ICD-10-CM | POA: Diagnosis not present

## 2021-01-22 DIAGNOSIS — E871 Hypo-osmolality and hyponatremia: Secondary | ICD-10-CM | POA: Diagnosis not present

## 2021-01-22 DIAGNOSIS — I82409 Acute embolism and thrombosis of unspecified deep veins of unspecified lower extremity: Secondary | ICD-10-CM | POA: Diagnosis not present

## 2021-01-22 DIAGNOSIS — E785 Hyperlipidemia, unspecified: Secondary | ICD-10-CM | POA: Diagnosis not present

## 2021-01-22 DIAGNOSIS — E1142 Type 2 diabetes mellitus with diabetic polyneuropathy: Secondary | ICD-10-CM | POA: Diagnosis not present

## 2021-01-22 DIAGNOSIS — Z7982 Long term (current) use of aspirin: Secondary | ICD-10-CM | POA: Diagnosis not present

## 2021-01-22 DIAGNOSIS — J9601 Acute respiratory failure with hypoxia: Secondary | ICD-10-CM | POA: Diagnosis not present

## 2021-01-22 DIAGNOSIS — I10 Essential (primary) hypertension: Secondary | ICD-10-CM | POA: Diagnosis not present

## 2021-01-22 DIAGNOSIS — G4700 Insomnia, unspecified: Secondary | ICD-10-CM | POA: Diagnosis not present

## 2021-01-22 MED ORDER — DIGOXIN 125 MCG PO TABS: 0.1250 mg | ORAL_TABLET | Freq: Every day | ORAL | 3 refills | Status: DC

## 2021-01-22 NOTE — Telephone Encounter (Signed)
Spoke with patient about his medication changes. Patient verbalized understanding, no further questions at this time.

## 2021-01-22 NOTE — Telephone Encounter (Signed)
Called patient to give him medication recommendation from Dr. Harriet Masson. Left message for patient to return our call.

## 2021-01-25 NOTE — Telephone Encounter (Signed)
error 

## 2021-01-27 ENCOUNTER — Ambulatory Visit (INDEPENDENT_AMBULATORY_CARE_PROVIDER_SITE_OTHER): Payer: Medicare Other

## 2021-01-27 ENCOUNTER — Ambulatory Visit: Payer: Medicare Other | Admitting: Infectious Disease

## 2021-01-27 VITALS — Ht 70.0 in | Wt 163.0 lb

## 2021-01-27 DIAGNOSIS — Z0000 Encounter for general adult medical examination without abnormal findings: Secondary | ICD-10-CM

## 2021-01-27 NOTE — Patient Instructions (Signed)
Luis Brown , Thank you for taking time to complete your Medicare Wellness Visit. I appreciate your ongoing commitment to your health goals. Please review the following plan we discussed and let me know if I can assist you in the future.   Screening recommendations/referrals: Colonoscopy: No longer required Recommended yearly ophthalmology/optometry visit for glaucoma screening and checkup Recommended yearly dental visit for hygiene and checkup  Vaccinations: Influenza vaccine: Up to date Pneumococcal vaccine: Up to date Tdap vaccine: Up to date-Due-07/19/2021 Shingles vaccine: Discuss with pharmacy   Covid-19: 2nd booster due-May obtain vaccine at your local pharmacy.  Advanced directives: Please Bring a copy for your chart  Conditions/risks identified: See problem list  Next appointment: Follow up in one year for your annual wellness visit. 01/31/2022 @ 2:20.  Preventive Care 11 Years and Older, Male Preventive care refers to lifestyle choices and visits with your health care provider that can promote health and wellness. What does preventive care include? A yearly physical exam. This is also called an annual well check. Dental exams once or twice a year. Routine eye exams. Ask your health care provider how often you should have your eyes checked. Personal lifestyle choices, including: Daily care of your teeth and gums. Regular physical activity. Eating a healthy diet. Avoiding tobacco and drug use. Limiting alcohol use. Practicing safe sex. Taking low doses of aspirin every day. Taking vitamin and mineral supplements as recommended by your health care provider. What happens during an annual well check? The services and screenings done by your health care provider during your annual well check will depend on your age, overall health, lifestyle risk factors, and family history of disease. Counseling  Your health care provider may ask you questions about your: Alcohol  use. Tobacco use. Drug use. Emotional well-being. Home and relationship well-being. Sexual activity. Eating habits. History of falls. Memory and ability to understand (cognition). Work and work Statistician. Screening  You may have the following tests or measurements: Height, weight, and BMI. Blood pressure. Lipid and cholesterol levels. These may be checked every 5 years, or more frequently if you are over 54 years old. Skin check. Lung cancer screening. You may have this screening every year starting at age 25 if you have a 30-pack-year history of smoking and currently smoke or have quit within the past 15 years. Fecal occult blood test (FOBT) of the stool. You may have this test every year starting at age 57. Flexible sigmoidoscopy or colonoscopy. You may have a sigmoidoscopy every 5 years or a colonoscopy every 10 years starting at age 59. Prostate cancer screening. Recommendations will vary depending on your family history and other risks. Hepatitis C blood test. Hepatitis B blood test. Sexually transmitted disease (STD) testing. Diabetes screening. This is done by checking your blood sugar (glucose) after you have not eaten for a while (fasting). You may have this done every 1-3 years. Abdominal aortic aneurysm (AAA) screening. You may need this if you are a current or former smoker. Osteoporosis. You may be screened starting at age 48 if you are at high risk. Talk with your health care provider about your test results, treatment options, and if necessary, the need for more tests. Vaccines  Your health care provider may recommend certain vaccines, such as: Influenza vaccine. This is recommended every year. Tetanus, diphtheria, and acellular pertussis (Tdap, Td) vaccine. You may need a Td booster every 10 years. Zoster vaccine. You may need this after age 18. Pneumococcal 13-valent conjugate (PCV13) vaccine. One dose is  recommended after age 27. Pneumococcal polysaccharide  (PPSV23) vaccine. One dose is recommended after age 24. Talk to your health care provider about which screenings and vaccines you need and how often you need them. This information is not intended to replace advice given to you by your health care provider. Make sure you discuss any questions you have with your health care provider. Document Released: 07/31/2015 Document Revised: 03/23/2016 Document Reviewed: 05/05/2015 Elsevier Interactive Patient Education  2017 [employer] Prevention in the Home Falls can cause injuries. They can happen to people of all ages. There are many things you can do to make your home safe and to help prevent falls. What can I do on the outside of my home? Regularly fix the edges of walkways and driveways and fix any cracks. Remove anything that might make you trip as you walk through a door, such as a raised step or threshold. Trim any bushes or trees on the path to your home. Use bright outdoor lighting. Clear any walking paths of anything that might make someone trip, such as rocks or tools. Regularly check to see if handrails are loose or broken. Make sure that both sides of any steps have handrails. Any raised decks and porches should have guardrails on the edges. Have any leaves, snow, or ice cleared regularly. Use sand or salt on walking paths during winter. Clean up any spills in your garage right away. This includes oil or grease spills. What can I do in the bathroom? Use night lights. Install grab bars by the toilet and in the tub and shower. Do not use towel bars as grab bars. Use non-skid mats or decals in the tub or shower. If you need to sit down in the shower, use a plastic, non-slip stool. Keep the floor dry. Clean up any water that spills on the floor as soon as it happens. Remove soap buildup in the tub or shower regularly. Attach bath mats securely with double-sided non-slip rug tape. Do not have throw rugs and other things on the  floor that can make you trip. What can I do in the bedroom? Use night lights. Make sure that you have a light by your bed that is easy to reach. Do not use any sheets or blankets that are too big for your bed. They should not hang down onto the floor. Have a firm chair that has side arms. You can use this for support while you get dressed. Do not have throw rugs and other things on the floor that can make you trip. What can I do in the kitchen? Clean up any spills right away. Avoid walking on wet floors. Keep items that you use a lot in easy-to-reach places. If you need to reach something above you, use a strong step stool that has a grab bar. Keep electrical cords out of the way. Do not use floor polish or wax that makes floors slippery. If you must use wax, use non-skid floor wax. Do not have throw rugs and other things on the floor that can make you trip. What can I do with my stairs? Do not leave any items on the stairs. Make sure that there are handrails on both sides of the stairs and use them. Fix handrails that are broken or loose. Make sure that handrails are as long as the stairways. Check any carpeting to make sure that it is firmly attached to the stairs. Fix any carpet that is loose or worn. Avoid having  throw rugs at the top or bottom of the stairs. If you do have throw rugs, attach them to the floor with carpet tape. Make sure that you have a light switch at the top of the stairs and the bottom of the stairs. If you do not have them, ask someone to add them for you. What else can I do to help prevent falls? Wear shoes that: Do not have high heels. Have rubber bottoms. Are comfortable and fit you well. Are closed at the toe. Do not wear sandals. If you use a stepladder: Make sure that it is fully opened. Do not climb a closed stepladder. Make sure that both sides of the stepladder are locked into place. Ask someone to hold it for you, if possible. Clearly mark and make  sure that you can see: Any grab bars or handrails. First and last steps. Where the edge of each step is. Use tools that help you move around (mobility aids) if they are needed. These include: Canes. Walkers. Scooters. Crutches. Turn on the lights when you go into a dark area. Replace any light bulbs as soon as they burn out. Set up your furniture so you have a clear path. Avoid moving your furniture around. If any of your floors are uneven, fix them. If there are any pets around you, be aware of where they are. Review your medicines with your doctor. Some medicines can make you feel dizzy. This can increase your chance of falling. Ask your doctor what other things that you can do to help prevent falls. This information is not intended to replace advice given to you by your health care provider. Make sure you discuss any questions you have with your health care provider. Document Released: 04/30/2009 Document Revised: 12/10/2015 Document Reviewed: 08/08/2014 Elsevier Interactive Patient Education  2017 [employer].

## 2021-01-27 NOTE — Progress Notes (Addendum)
Subjective:   Luis Brown is a 75 y.o. male who presents for Medicare Annual/Subsequent preventive examination.  I connected with  Luis Brown today by a video enabled telemedicine application and verified that I am speaking with the correct person using two identifiers.  Location of patient:home Location of provider:Work  Persons participating in the virtual visit: patient, nurse.   I discussed the limitations, risk, security and privacy concerns of evaluation and management by telemedicine. The patient expressed understanding and agreed to proceed.  Some vital signs may be absent or patient reported.    Review of Systems     Cardiac Risk Factors include: male gender;advanced age (>17men, >50 women);diabetes mellitus;dyslipidemia;hypertension     Objective:    Today's Vitals   01/27/21 1415  Weight: 163 lb (73.9 kg)  Height: 5\' 10"  (1.778 m)   Body mass index is 23.39 kg/m.  Advanced Directives 01/27/2021 11/17/2020 11/10/2020 11/10/2020 09/14/2020 09/08/2020 06/09/2020  Does Patient Have a Medical Advance Directive? Yes Yes Yes Yes Yes Yes Yes  Type of 06/11/2020 of [place];Living will Healthcare Power of [place];Living will Living will - Healthcare Power of [place] Power of [place];Living will Living will  Does patient want to make changes to medical advance directive? - - No - Patient declined - No - Patient declined - -  Copy of Healthcare Power of Attorney in Chart? No - copy requested Yes - validated most recent copy scanned in chart (See row information) - - No - copy requested No - copy requested -  Would patient like information on creating a medical advance directive? - - - - - - -    Current Medications (verified) Outpatient Encounter Medications as of 01/27/2021  Medication Sig   atorvastatin (LIPITOR) 10 MG tablet TAKE 1 TABLET BY MOUTH EVERY DAY   busPIRone (BUSPAR) 7.5 MG tablet 1-2 TAB BY MOUTH 3 TIMES A DAY FOR ANXIETY  (Patient taking differently: Take 7.5 mg by mouth 2 (two) times daily.)   cetirizine (ZYRTEC) 10 MG tablet Take 10 mg by mouth daily. In the morning   dapsone 100 MG tablet Take 1 tablet (100 mg total) by mouth daily.   digoxin (LANOXIN) 0.125 MG tablet Take 1 tablet (0.125 mg total) by mouth daily.   ferrous fumarate (FERRETTS) 325 (106 Fe) MG TABS tablet Take 1 tablet (106 mg of iron total) by mouth daily.   glucose blood (CONTOUR NEXT TEST) test strip Check blood sugar once daily   JANUVIA 50 MG tablet TAKE 1 TABLET BY MOUTH EVERY DAY   metFORMIN (GLUCOPHAGE) 1000 MG tablet Take 1 tablet (1,000 mg total) by mouth 2 (two) times daily with a meal.   pantoprazole (PROTONIX) 40 MG tablet TAKE 1 TABLET BY MOUTH TWICE A DAY BEFORE A MEAL   pregabalin (LYRICA) 75 MG capsule TAKE 1 CAPSULE BY MOUTH TWICE A DAY (Patient taking differently: Take 75 mg by mouth 2 (two) times daily.)   PROVENTIL HFA 108 (90 Base) MCG/ACT inhaler TAKE 2 PUFFS BY MOUTH EVERY 6 HOURS AS NEEDED FOR WHEEZE OR SHORTNESS OF BREATH   rivaroxaban (XARELTO) 20 MG TABS tablet Take 1 tablet (20 mg total) by mouth daily with supper.   sucralfate (CARAFATE) 1 g tablet TAKE 1 TABLET (1 G TOTAL) BY MOUTH 4 (FOUR) TIMES DAILY - WITH MEALS AND AT BEDTIME.   tamsulosin (FLOMAX) 0.4 MG CAPS capsule Take 2 capsules (0.8 mg total) by mouth daily.   testosterone cypionate (DEPOTESTOSTERONE CYPIONATE) 200 MG/ML injection  INJECT 1 ML (200 MG TOTAL) INTO THE MUSCLE EVERY 14 (FOURTEEN) DAYS.   tiZANidine (ZANAFLEX) 4 MG tablet Take 1.5 tablets (6 mg total) by mouth every 6 (six) hours as needed for muscle spasms.   venlafaxine XR (EFFEXOR-XR) 150 MG 24 hr capsule TAKE 1 CAPSULE (150 MG TOTAL) BY MOUTH DAILY WITH BREAKFAST.   ALPRAZolam (XANAX) 0.5 MG tablet TAKE 1 TABLET BY MOUTH 2 TIMES DAILY AS NEEDED FOR ANXIETY. (Patient not taking: No sig reported)   b complex vitamins capsule Take 1 capsule by mouth daily. (Patient not taking: No sig reported)    famotidine (PEPCID) 40 MG tablet TAKE 1 TABLET BY MOUTH EVERYDAY AT BEDTIME (Patient not taking: No sig reported)   HYDROcodone-acetaminophen (NORCO) 7.5-325 MG tablet Take 1 tablet by mouth every 4 (four) hours as needed for up to 7 days for moderate pain ((score 4 to 6)).   nystatin cream (MYCOSTATIN) Apply 1 application topically 2 (two) times daily. (Patient not taking: No sig reported)   olmesartan (BENICAR) 5 MG tablet TAKE 1 TABLET (5 MG TOTAL) BY MOUTH DAILY. (Patient not taking: No sig reported)   sodium chloride (OCEAN) 0.65 % SOLN nasal spray Place 1 spray into both nostrils as needed for congestion. (Patient not taking: No sig reported)   terbinafine (LAMISIL) 250 MG tablet Take 1 tablet (250 mg total) by mouth daily. (Patient not taking: No sig reported)   Facility-Administered Encounter Medications as of 01/27/2021  Medication   testosterone cypionate (DEPOTESTOSTERONE CYPIONATE) injection 200 mg    Allergies (verified) Patient has no known allergies.   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   Esophageal reflux 02/10/2015   Fatigue    HTN (hypertension)    Hyperglycemia    preDM    Hyperlipidemia    Insomnia    Low back pain radiating to both legs 01/16/2017   Measles as a child   Overweight(278.02)    Peripheral neuropathy 08/07/2019   Personal history of colonic polyps 10/27/2012   Follows with [facility]   Pneumonia    " walking"   Preventative health care    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   Wears  glasses    Past Surgical History:  Procedure Laterality Date   BACK SURGERY  2012 and 1994   Dr Margret Chance, screws and cage in low back   BIOPSY  01/09/2020   Procedure: BIOPSY;  Surgeon: Otis Brace, MD;  Location: [facility];  Service: Gastroenterology;;   BRONCHIAL BIOPSY  11/17/2020   Procedure: BRONCHIAL BIOPSIES;  Surgeon: Laurin Coder, MD;  Location: [facility];  Service: Endoscopy;;   BRONCHIAL BRUSHINGS  11/17/2020   Procedure: BRONCHIAL BRUSHINGS;  Surgeon: Laurin Coder, MD;  Location: [facility];  Service: Endoscopy;;   BRONCHIAL WASHINGS  11/17/2020   Procedure: BRONCHIAL WASHINGS;  Surgeon: Laurin Coder, MD;  Location: [facility];  Service: Endoscopy;;   COLONOSCOPY WITH PROPOFOL N/A 01/09/2020   Procedure: COLONOSCOPY WITH PROPOFOL;  Surgeon: Otis Brace,  MD;  Location: [facility];  Service: Gastroenterology;  Laterality: N/A;   ESOPHAGOGASTRODUODENOSCOPY (EGD) WITH PROPOFOL N/A 01/09/2020   Procedure: ESOPHAGOGASTRODUODENOSCOPY (EGD) WITH PROPOFOL;  Surgeon: Otis Brace, MD;  [facility];  Service: Gastroenterology;  Laterality: N/A;   EYE SURGERY Bilateral    2016   HARDWARE REVISION  03/12/2019   REVISION OF HARDWARE THORACIC TEN-THORACIC ELEVEN WITH APPLICATION OF ADDITIONAL RODS AND ROD SLEEVES THORACIC EIGHT-THORACIC TWELVE 12-LUMBAR ONE (N/A )   HEMOSTASIS CONTROL  11/17/2020   Procedure: HEMOSTASIS CONTROL;  Surgeon: Laurin Coder, MD;  [facility]: [facility];  Service: Endoscopy;;   IR US GUIDE BX ASP/DRAIN  11/13/2020   LAMINECTOMY  02/12/2019   DECOMPRESSIVE LAMINECTOMY THORACIC NINE-THORACIC TEN AND THORACIC TEN-THORACIC ELEVEN, EXTENSION OF THORACOLUMBAR FUSION FROM THORACIC TEN TO THORACIC FIVE, LOCAL BONE GRAFT, ALLOGRAFT AND VIVIGEN (N/A)   POLYPECTOMY  01/09/2020   Procedure: POLYPECTOMY;  Surgeon: Otis Brace, MD;  [facility]: [facility];  Service: Gastroenterology;;   REMOVAL OF RODS AND PEDICLE SCREWS T5  AND T6, EXPLORATION OF FUSION, BIOPSY TRANSPEDICULAR T5 AND T6 (N/A )  09/14/2020   SPINAL FUSION N/A 02/12/2019   Procedure: DECOMPRESSIVE LAMINECTOMY THORACIC NINE-THORACIC TEN AND THORACIC TEN-THORACIC ELEVEN, EXTENSION OF THORACOLUMBAR FUSION FROM THORACIC TEN TO THORACIC FIVE, LOCAL BONE GRAFT, ALLOGRAFT AND VIVIGEN;  Surgeon: Jessy Oto, MD;  Location: [place];  Service: Orthopedics;  Laterality: N/A;  DECOMPRESSIVE LAMINECTOMY THORACIC NINE-THORACIC TEN AND THORACIC TEN-THORACIC ELEVEN, EXTENSION OF TH   SPINAL FUSION N/A 03/12/2019   Procedure: REVISION OF HARDWARE THORACIC TEN-THORACIC ELEVEN WITH APPLICATION OF ADDITIONAL RODS AND ROD SLEEVES THORACIC EIGHT-THORACIC TWELVE 12-LUMBAR ONE;  Surgeon: Jessy Oto, MD;  Location: [place];  Service: Orthopedics;  Laterality: N/A;   TONSILLECTOMY     as child   torn rotator cuff  2010   right   VIDEO BRONCHOSCOPY N/A 11/17/2020   Procedure: VIDEO BRONCHOSCOPY WITH FLUORO;  Surgeon: Laurin Coder, MD;  [facility];  Service: Endoscopy;  Laterality: N/A;   Family History  Problem Relation Age of Onset   Hypertension Mother    Diabetes Mother        type 2   Cancer Mother 64       breast in remission   Emphysema Father        smoker   COPD Father        smoker   Stroke Father 34       mini   Heart disease Father    Hypertension Sister    Hyperlipidemia Sister    Scoliosis Sister    Osteoporosis Sister    Hypertension Maternal Grandmother    Scoliosis Maternal Grandmother    Heart disease Maternal Grandfather    Heart disease Paternal Grandfather        smoker   Heart disease Daughter    Social History   Socioeconomic History   Marital status: Married    Spouse name: Not on file   Number of children: Not on file   Years of education: Not on file   Highest education level: Not on file  Occupational History   Not on file  Tobacco Use   Smoking status: Former    Packs/day: 1.00    Years: 20.00    Pack  years: 20.00    Types: Cigarettes    Quit date: 07/18/1989    Years since quitting: 31.5   Smokeless tobacco: Never   Tobacco comments:    off and on smoking  for the 20 yrs  Vaping Use   Vaping Use: Never used  Substance and Sexual Activity   Alcohol use: Not Currently   Drug use: No   Sexual activity: Yes    Comment: lives with wife, still working, no dietary restrictions, continues to exercise intermittently  Other Topics Concern   Not on file  Social History Narrative   Not on file   Social Determinants of Health   Financial Resource Strain: Low Risk    Difficulty of Paying Living Expenses: Not hard at all  Food Insecurity: No Food Insecurity   Worried About Charity fundraiser in the Last Year: Never true   Arboriculturist in the Last Year: Never true  Transportation Needs: No Transportation Needs   Lack of Transportation (Medical): No   Lack of Transportation (Non-Medical): No  Physical Activity: Sufficiently Active   Days of Exercise per Week: 7 days   Minutes of Exercise per Session: 30 min  Stress: No Stress Concern Present   Feeling of Stress : Not at all  Social Connections: Moderately Integrated   Frequency of Communication with Friends and Family: More than three times a week   Frequency of Social Gatherings with Friends and Family: More than three times a week   Attends Religious Services: More than 4 times per year   Active Member of [employer] or Organizations: No   Attends Music therapist: Never   Marital Status: Married    Tobacco Counseling Counseling given: Not Answered Tobacco comments: off and on smoking for the 20 yrs   Clinical Intake:  Pre-visit preparation completed: Yes  Pain : No/denies pain     Nutritional Status: BMI of 19-24  Normal Nutritional Risks: None Diabetes: Yes CBG done?: No CBG resulted in Enter/ Edit results?: No Did pt. bring in CBG monitor from home?: No (phone visit)  How often do you need to have  someone help you when you read instructions, pamphlets, or other written materials from your doctor or pharmacy?: 1 - Never  Diabetes:  Is the patient diabetic?  Yes  If diabetic, was a CBG obtained today?  No  Did the patient bring in their glucometer from home?  No phone visit How often do you monitor your CBG's? Daily.   Financial Strains and Diabetes Management:  Are you having any financial strains with the device, your supplies or your medication? No .  Does the patient want to be seen by Chronic Care Management for management of their diabetes?  No  Would the patient like to be referred to a Nutritionist or for Diabetic Management?  No   Diabetic Exams:  Diabetic Eye Exam: Completed 34/2022 per patient. . Awaiting notes   Diabetic Foot Exam: Pt has been advised about the importance in completing this exam. To be completed by PCP   Interpreter Needed?: No  Information entered by :: Caroleen Hamman  LPN   Activities of Daily Living In your present state of health, do you have any difficulty performing the following activities: 01/27/2021 11/10/2020  Hearing? Y N  Comment mild -  Vision? N N  Difficulty concentrating or making decisions? N N  Walking or climbing stairs? N N  Dressing or bathing? N N  Doing errands, shopping? N N  Preparing Food and eating ? N -  Using the Toilet? N -  In the past six months, have you accidently leaked urine? N -  Do you have problems with loss of bowel  control? N -  Managing your Medications? N -  Managing your Finances? N -  Housekeeping or managing your Housekeeping? N -  Some recent data might be hidden    Patient Care Team: Saguier, Iris Pert as PCP - General (Internal Medicine) Berniece Salines, DO as PCP - Cardiology (Cardiology) Keene Breath., MD (Ophthalmology) Jessy Oto, MD as Consulting Physician (Orthopedic Surgery)  Indicate any recent Medical Services you may have received from other than [facility] providers in the  past year (date may be approximate).     Assessment:   This is a routine wellness examination for [place].  Hearing/Vision screen Hearing Screening - Comments:: Mild hearing loss Vision Screening - Comments:: Lst eye exam-3 months ago-Dr. Clydene Laming  Dietary issues and exercise activities discussed: Current Exercise Habits: Home exercise routine, Type of exercise: walking (yard work), Time (Minutes): 30, Frequency (Times/Week): 7, Weekly Exercise (Minutes/Week): 210, Intensity: Mild, Exercise limited by: cardiac condition(s) (A Fib)   Goals Addressed             This Visit's Progress    Patient Stated   On track    Cut back on sugar        Depression Screen PHQ 2/9 Scores 01/27/2021 12/09/2020 12/04/2020 05/26/2020 03/20/2020 11/05/2019 10/15/2019  PHQ - 2 Score 1 0 1 1 0 1 0  PHQ- 9 Score - - - - 0 - -    Fall Risk Fall Risk  01/27/2021 12/09/2020 12/04/2020 10/20/2020 05/26/2020  Falls in the past year? 1 0 0 0 0  Number falls in past yr: 0 0 0 0 -  Injury with Fall? 0 0 0 0 -  Risk for fall due to : - - - - -  Follow up Falls prevention discussed Falls evaluation completed - - Falls evaluation completed    Independence:  Any stairs in or around the home? Yes  If so, are there any without handrails? No  Home free of loose throw rugs in walkways, pet beds, electrical cords, etc? Yes  Adequate lighting in your home to reduce risk of falls? Yes   ASSISTIVE DEVICES UTILIZED TO PREVENT FALLS:  Life alert? No  Use of a cane, walker or w/c? No  Grab bars in the bathroom? Yes  Shower chair or bench in shower? No  Elevated toilet seat or a handicapped toilet? No   TIMED UP AND GO:  Was the test performed? No . Phone visit   Cognitive Function:Normal cognitive status assessed by direct observation by this Nurse Health Advisor. No abnormalities found.    MMSE - Mini Mental State Exam 08/23/2016  Orientation to time 5  Orientation to Place 5   Registration 3  Attention/ Calculation 5  Recall 2  Language- name 2 objects 2  Language- repeat 1  Language- follow 3 step command 3  Language- read & follow direction 1  Write a sentence 1  Copy design 1  Total score 29        Immunizations Immunization History  Administered Date(s) Administered   Fluad Quad(high Dose 65+) 04/05/2019, 06/29/2020   Influenza Split 04/23/2012   Influenza Whole 02/16/2011   Influenza, High Dose Seasonal PF 05/06/2013, 03/25/2016, 05/05/2017, 06/07/2018   PFIZER(Purple Top)SARS-COV-2 Vaccination 08/23/2019, 09/13/2019, 06/07/2020   Pneumococcal Conjugate-13 03/25/2016   Pneumococcal Polysaccharide-23 01/20/2009, 10/19/2011   Td 07/09/2003, 07/18/2008   Tdap 07/20/2011   Zoster, Live 04/27/2016    TDAP status: Up to date  Flu Vaccine status:  Up to date  Pneumococcal vaccine status: Up to date  Covid-19 vaccine status: Information provided on how to obtain vaccines. Booster due  Qualifies for Shingles Vaccine? Yes   Zostavax completed Yes   Shingrix Completed?: No.    Education has been provided regarding the importance of this vaccine. Patient has been advised to call insurance company to determine out of pocket expense if they have not yet received this vaccine. Advised may also receive vaccine at local pharmacy or Health Dept. Verbalized acceptance and understanding.  Screening Tests Health Maintenance  Topic Date Due   Zoster Vaccines- Shingrix (1 of 2) Never done   OPHTHALMOLOGY EXAM  04/17/2017   FOOT EXAM  08/23/2017   COVID-19 Vaccine (4 - Booster for Pfizer series) 09/07/2020   INFLUENZA VACCINE  02/15/2021   HEMOGLOBIN A1C  05/21/2021   TETANUS/TDAP  07/19/2021   Hepatitis C Screening  Completed   PNA vac Low Risk Adult  Completed   HPV VACCINES  Aged Out    Health Maintenance  Health Maintenance Due  Topic Date Due   Zoster Vaccines- Shingrix (1 of 2) Never done   OPHTHALMOLOGY EXAM  04/17/2017   FOOT EXAM   08/23/2017   COVID-19 Vaccine (4 - Booster for Pfizer series) 09/07/2020    Colorectal cancer screening: No longer required.   Lung Cancer Screening: (Low Dose CT Chest recommended if Age 71-80 years, 30 pack-year currently smoking OR have quit w/in 15years.) does not qualify.     Additional Screening:  Hepatitis C Screening: Completed 03/25/2016  Vision Screening: Recommended annual ophthalmology exams for early detection of glaucoma and other disorders of the eye. Is the patient up to date with their annual eye exam?  Yes  Who is the provider or what is the name of the office in which the patient attends annual eye exams? Dr. Clydene Laming   Dental Screening: Recommended annual dental exams for proper oral hygiene  Community Resource Referral / Chronic Care Management: CRR required this visit?  No   CCM required this visit?  No      Plan:     I have personally reviewed and noted the following in the patient's chart:   Medical and social history Use of alcohol, tobacco or illicit drugs  Current medications and supplements including opioid prescriptions. Patient is not currently taking opioid prescriptions. Functional ability and status Nutritional status Physical activity Advanced directives List of other physicians Hospitalizations, surgeries, and ER visits in previous 12 months Vitals Screenings to include cognitive, depression, and falls Referrals and appointments  In addition, I have reviewed and discussed with patient certain preventive protocols, quality metrics, and best practice recommendations. A written personalized care plan for preventive services as well as general preventive health recommendations were provided to patient.   Due to this being a telephonic visit, the after visit summary with patients personalized plan was offered to patient via mail or my-chart. Patient would like to access on my-chart.   Marta Antu, LPN   [id number]/[id number]  Nurse Health  Advisor  Nurse Notes: None  I have personally reviewed this encounter including the documentation in this note and have collaborated with the care management provider regarding care management and care coordination activities to include development and update of the comprehensive care plan. I am certifying that I agree with the content of this note and encounter as supervising physician.  Mackie Pai, PA-C

## 2021-01-28 ENCOUNTER — Telehealth: Payer: Self-pay

## 2021-01-28 NOTE — Telephone Encounter (Signed)
Spoke with patient to letting him know Dr. Harriet Masson will be out of the office next week but another provider can look over his EKG when it is done. Patient is willing to wait till Monday because he can not travel to [place].

## 2021-01-29 ENCOUNTER — Other Ambulatory Visit: Payer: Self-pay

## 2021-01-29 ENCOUNTER — Encounter: Payer: Self-pay | Admitting: Specialist

## 2021-01-29 ENCOUNTER — Ambulatory Visit: Payer: Self-pay

## 2021-01-29 ENCOUNTER — Ambulatory Visit: Payer: Medicare Other | Admitting: Specialist

## 2021-01-29 ENCOUNTER — Other Ambulatory Visit: Payer: Self-pay | Admitting: Internal Medicine

## 2021-01-29 ENCOUNTER — Telehealth: Payer: Self-pay

## 2021-01-29 VITALS — BP 148/80 | HR 86 | Ht 70.0 in | Wt 163.0 lb

## 2021-01-29 DIAGNOSIS — M960 Pseudarthrosis after fusion or arthrodesis: Secondary | ICD-10-CM

## 2021-01-29 DIAGNOSIS — T8484XA Pain due to internal orthopedic prosthetic devices, implants and grafts, initial encounter: Secondary | ICD-10-CM | POA: Diagnosis not present

## 2021-01-29 DIAGNOSIS — M40294 Other kyphosis, thoracic region: Secondary | ICD-10-CM | POA: Diagnosis not present

## 2021-01-29 DIAGNOSIS — Z9889 Other specified postprocedural states: Secondary | ICD-10-CM

## 2021-01-29 DIAGNOSIS — Z981 Arthrodesis status: Secondary | ICD-10-CM | POA: Diagnosis not present

## 2021-01-29 DIAGNOSIS — M4644 Discitis, unspecified, thoracic region: Secondary | ICD-10-CM | POA: Diagnosis not present

## 2021-01-29 DIAGNOSIS — M546 Pain in thoracic spine: Secondary | ICD-10-CM

## 2021-01-29 DIAGNOSIS — T84498A Other mechanical complication of other internal orthopedic devices, implants and grafts, initial encounter: Secondary | ICD-10-CM | POA: Diagnosis not present

## 2021-01-29 DIAGNOSIS — M4325 Fusion of spine, thoracolumbar region: Secondary | ICD-10-CM | POA: Diagnosis not present

## 2021-01-29 NOTE — Telephone Encounter (Signed)
Per last notation in chart it looks like Dr Juleen China wanted inflammatory markers checked in July. Will forward refill request for Dapsone to provider and verify if we need to order labs prior to filling RX.

## 2021-01-29 NOTE — Telephone Encounter (Signed)
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

## 2021-01-29 NOTE — Telephone Encounter (Signed)
error 

## 2021-01-29 NOTE — Progress Notes (Signed)
Office Visit Note   Patient: Luis Brown           Date of Birth: Jan 17, 1946           MRN: [id number] Visit Date: 01/29/2021              Requested by: Mackie Pai, PA-C [place],  [place] [address] PCP: Mackie Pai, PA-C   Assessment & Plan: Visit Diagnoses:  1. Status post hardware removal   2. Pain in thoracic spine   3. Other kyphosis, thoracic region   4. 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 ([place])   5. Discitis thoracic region   6. Pseudarthrosis after fusion or arthrodesis   7. Painful orthopaedic hardware ([place])   8. Status post thoracic spinal fusion   9. Fusion of spine of thoracolumbar region     Plan: Avoid frequent bending and stooping  No lifting greater than 10 lbs. May use ice or moist heat for pain. Weight loss is of benefit.  Exercise is important to improve your indurance and does allow people to function better inspite of back pain.  Walking, pool exercises and yoga or balance exercises.  Follow-Up Instructions: Return in about 4 months (around 06/01/2021).   Orders:  Orders Placed This Encounter  Procedures   XR Thoracic Spine 2 View   No orders of the defined types were placed in this encounter.     Procedures: No procedures performed   Clinical Data: No additional findings.   Subjective: Chief Complaint  Patient presents with   Middle Back - Pain    75 year old male with history of long thoracolumbar fusion for    Review of Systems  Constitutional:  Positive for activity change.  HENT: Negative.    Eyes: Negative.   Respiratory:  Positive for cough, chest tightness and shortness of breath.   Cardiovascular:  Positive for chest pain.  Gastrointestinal: Negative.   Endocrine: Negative.   Genitourinary: Negative.   Musculoskeletal:  Positive for back pain and gait problem.  Skin: Negative.   Allergic/Immunologic: Negative.   Neurological:  Positive for weakness.  Hematological: Negative.    Psychiatric/Behavioral: Negative.      Objective: Vital Signs: BP (!) 148/80   Pulse 86   Ht 5\' 10"  (1.778 m)   Wt 163 lb (73.9 kg)   BMI 23.39 kg/m   Physical Exam Constitutional:      Appearance: He is well-developed.  HENT:     Head: Normocephalic and atraumatic.  Eyes:     Pupils: Pupils are equal, round, and reactive to light.  Pulmonary:     Effort: Pulmonary effort is normal.     Breath sounds: Normal breath sounds.  Abdominal:     General: Bowel sounds are normal.     Palpations: Abdomen is soft.  Musculoskeletal:     Cervical back: Normal range of motion and neck supple.     Lumbar back: Negative right straight leg raise test and negative left straight leg raise test.  Skin:    General: Skin is warm and dry.  Neurological:     Mental Status: He is alert and oriented to person, place, and time.  Psychiatric:        Behavior: Behavior normal.        Thought Content: Thought content normal.        Judgment: Judgment normal.    Back Exam   Tenderness  The patient is experiencing tenderness in the lumbar  and thoracic.  Range of Motion  Extension:  abnormal  Flexion:  abnormal  Lateral bend right:  abnormal  Lateral bend left:  abnormal  Rotation right:  abnormal  Rotation left:  abnormal   Muscle Strength  Right Quadriceps:  5/5  Left Quadriceps:  5/5  Right Hamstrings:  5/5  Left Hamstrings:  5/5   Tests  Straight leg raise right: negative Straight leg raise left: negative  Other  Toe walk: normal Heel walk: normal Sensation: normal Gait: normal  Erythema: no back redness Scars: absent     Specialty Comments:  No specialty comments available.  Imaging: No results found.   PMFS History: Patient Active Problem List   Diagnosis Date Noted   Wears glasses 01/11/2021   Pneumothorax, right 12/15/2020   Anxiety 12/01/2020   SOB (shortness of breath)    DVT (deep venous thrombosis) ([place]) 11/16/2020   Acute respiratory failure with  hypoxia ([place]) 11/14/2020   Fluid collection at surgical site    Wound infection    Sepsis ([place]) 11/11/2020   Drug-induced pneumonitis 11/10/2020   S/P bronchoscopy 09/14/2020   Hyperglycemia 03/22/2020   Gastric ulcer 01/27/2020   H/O colonoscopy with polypectomy 01/27/2020   Protein-calorie malnutrition, severe 01/09/2020   Weight loss 01/07/2020   Abdominal pain 12/30/2019   Nausea and vomiting 12/30/2019   Weight loss, abnormal 12/30/2019   Change in bowel habits 12/30/2019   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   Medication monitoring encounter 08/26/2019   Peripheral neuropathy 08/07/2019   Alcohol abuse 05/09/2019   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[place]) 04/04/2019   Fusion of spine of thoracolumbar region 03/12/2019   Status post thoracic spinal fusion 02/12/2019   Spinal stenosis of lumbar region with neurogenic claudication 05/23/2017   Thoracic spinal stenosis 04/17/2017   Painful orthopaedic hardware ([place]) 04/17/2017   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 ([place]) 04/17/2017   Low back pain radiating to both legs 01/16/2017   Esophageal reflux 02/10/2015   Medicare annual wellness visit, subsequent 02/10/2015   Chicken pox 10/21/2013   Personal history of colonic polyps 10/27/2012   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   BPH (benign prostatic hyperplasia) 04/23/2012   ED (erectile dysfunction) 04/23/2012   Allergic state 10/25/2011   Anxiety and depression 10/25/2011   Asthma    Diabetes mellitus without complication ([place])    Hyperlipidemia    Overweight    Insomnia    Fatigue    Preventative health care    DDD (degenerative disc disease), lumbosacral    Thoracic degenerative disc disease    HTN (hypertension)    Past Medical History:  Diagnosis Date   Acute pharyngitis 10/21/2013   Allergy    grass and pollen   Anxiety and depression 10/25/2011   BPH (benign prostatic hyperplasia) 04/23/2012   Chicken pox as a  child   DDD (degenerative disc disease)    cervical responds to steroid injections and low back required surgery   DDD (degenerative disc disease), lumbosacral    Diabetes mellitus    pre   Dyspnea    ED (erectile dysfunction) 04/23/2012   Elevated BP    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   Esophageal reflux 02/10/2015   Fatigue    HTN (hypertension)    Hyperglycemia    preDM    Hyperlipidemia    Insomnia    Low back pain radiating to both legs 01/16/2017   Measles as a child   Overweight(278.02)    Peripheral neuropathy  08/07/2019   Personal history of colonic polyps 10/27/2012   Follows with [facility]   Pneumonia    " walking"   Preventative health care    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   Wears glasses     Family History  Problem Relation Age of Onset   Hypertension Mother    Diabetes Mother        type 2   Cancer Mother 76       breast in remission   Emphysema Father        smoker   COPD Father        smoker   Stroke Father 24       mini   Heart disease Father    Hypertension Sister    Hyperlipidemia Sister    Scoliosis Sister    Osteoporosis Sister    Hypertension Maternal Grandmother    Scoliosis Maternal Grandmother    Heart disease Maternal Grandfather    Heart disease Paternal Grandfather        smoker   Heart disease Daughter     Past Surgical History:  Procedure Laterality Date   BACK SURGERY  2012 and 1994   Dr Margret Chance, screws and cage in low back   BIOPSY  01/09/2020   Procedure: BIOPSY;  Surgeon: Otis Brace, MD;  [facility];  Service: Gastroenterology;;   BRONCHIAL BIOPSY  11/17/2020   Procedure: BRONCHIAL BIOPSIES;  Surgeon: Laurin Coder, MD;  [facility];  Service: Endoscopy;;   BRONCHIAL BRUSHINGS  11/17/2020   Procedure: BRONCHIAL BRUSHINGS;  Surgeon: Laurin Coder, MD;  [facility];  Service: Endoscopy;;   BRONCHIAL WASHINGS  11/17/2020   Procedure: BRONCHIAL WASHINGS;  Surgeon: Laurin Coder, MD;  [facility];  Service: Endoscopy;;   COLONOSCOPY WITH PROPOFOL N/A 01/09/2020   Procedure: COLONOSCOPY WITH PROPOFOL;  Surgeon: Otis Brace, MD;  [facility];  Service: Gastroenterology;  Laterality: N/A;   ESOPHAGOGASTRODUODENOSCOPY (EGD) WITH PROPOFOL N/A 01/09/2020   Procedure: ESOPHAGOGASTRODUODENOSCOPY (EGD) WITH PROPOFOL;  Surgeon: Otis Brace, MD;  [facility];  Service: Gastroenterology;  Laterality: N/A;   EYE SURGERY Bilateral    2016   HARDWARE REVISION  03/12/2019   REVISION OF HARDWARE THORACIC TEN-THORACIC ELEVEN WITH APPLICATION OF ADDITIONAL RODS AND ROD SLEEVES THORACIC EIGHT-THORACIC TWELVE 12-LUMBAR ONE (N/A )   HEMOSTASIS CONTROL  11/17/2020   Procedure: HEMOSTASIS CONTROL;  Surgeon: Laurin Coder, MD;  [facility];  Service: Endoscopy;;   IR US GUIDE BX ASP/DRAIN  11/13/2020   LAMINECTOMY  02/12/2019   DECOMPRESSIVE LAMINECTOMY THORACIC NINE-THORACIC TEN AND THORACIC TEN-THORACIC ELEVEN, EXTENSION OF THORACOLUMBAR FUSION FROM THORACIC TEN TO THORACIC FIVE, LOCAL BONE GRAFT, ALLOGRAFT AND VIVIGEN (N/A)   POLYPECTOMY  01/09/2020   Procedure: POLYPECTOMY;  Surgeon: Otis Brace, MD;  [facility];  Service: Gastroenterology;;   REMOVAL OF RODS AND PEDICLE SCREWS T5 AND T6, EXPLORATION OF FUSION, BIOPSY TRANSPEDICULAR T5 AND T6 (N/A )  09/14/2020   SPINAL FUSION N/A 02/12/2019   Procedure: DECOMPRESSIVE LAMINECTOMY THORACIC NINE-THORACIC TEN AND THORACIC TEN-THORACIC ELEVEN, EXTENSION OF THORACOLUMBAR FUSION FROM THORACIC TEN TO THORACIC FIVE, LOCAL BONE GRAFT, ALLOGRAFT AND VIVIGEN;  Surgeon: Jessy Oto, MD;  Location: [facility];  Service: Orthopedics;  Laterality: N/A;  DECOMPRESSIVE LAMINECTOMY THORACIC NINE-THORACIC TEN AND THORACIC TEN-THORACIC ELEVEN, EXTENSION OF TH   SPINAL FUSION N/A 03/12/2019   Procedure: REVISION OF HARDWARE THORACIC TEN-THORACIC ELEVEN WITH APPLICATION OF ADDITIONAL  RODS AND ROD SLEEVES THORACIC EIGHT-THORACIC  TWELVE 12-LUMBAR ONE;  Surgeon: Jessy Oto, MD;  Location: [facility];  Service: Orthopedics;  Laterality: N/A;   TONSILLECTOMY     as child   torn rotator cuff  2010   right   VIDEO BRONCHOSCOPY N/A 11/17/2020   Procedure: VIDEO BRONCHOSCOPY WITH FLUORO;  Surgeon: Laurin Coder, MD;  [facility];  Service: Endoscopy;  Laterality: N/A;   Social History   Occupational History   Not on file  Tobacco Use   Smoking status: Former    Packs/day: 1.00    Years: 20.00    Pack years: 20.00    Types: Cigarettes    Quit date: 07/18/1989    Years since quitting: 31.5   Smokeless tobacco: Never   Tobacco comments:    off and on smoking for the 20 yrs  Vaping Use   Vaping Use: Never used  Substance and Sexual Activity   Alcohol use: Not Currently   Drug use: No   Sexual activity: Yes    Comment: lives with wife, still working, no dietary restrictions, continues to exercise intermittently

## 2021-02-01 ENCOUNTER — Other Ambulatory Visit: Payer: Self-pay

## 2021-02-01 ENCOUNTER — Ambulatory Visit (INDEPENDENT_AMBULATORY_CARE_PROVIDER_SITE_OTHER): Payer: Medicare Other | Admitting: Cardiology

## 2021-02-01 VITALS — BP 144/76 | HR 81 | Ht 67.0 in | Wt 166.0 lb

## 2021-02-01 DIAGNOSIS — R002 Palpitations: Secondary | ICD-10-CM

## 2021-02-01 DIAGNOSIS — E785 Hyperlipidemia, unspecified: Secondary | ICD-10-CM | POA: Diagnosis not present

## 2021-02-01 DIAGNOSIS — G4700 Insomnia, unspecified: Secondary | ICD-10-CM | POA: Diagnosis not present

## 2021-02-01 DIAGNOSIS — E1142 Type 2 diabetes mellitus with diabetic polyneuropathy: Secondary | ICD-10-CM | POA: Diagnosis not present

## 2021-02-01 DIAGNOSIS — M5137 Other intervertebral disc degeneration, lumbosacral region: Secondary | ICD-10-CM | POA: Diagnosis not present

## 2021-02-01 DIAGNOSIS — I10 Essential (primary) hypertension: Secondary | ICD-10-CM

## 2021-02-01 DIAGNOSIS — Z7982 Long term (current) use of aspirin: Secondary | ICD-10-CM | POA: Diagnosis not present

## 2021-02-01 DIAGNOSIS — I480 Paroxysmal atrial fibrillation: Secondary | ICD-10-CM | POA: Diagnosis not present

## 2021-02-01 DIAGNOSIS — Z7984 Long term (current) use of oral hypoglycemic drugs: Secondary | ICD-10-CM | POA: Diagnosis not present

## 2021-02-01 DIAGNOSIS — E871 Hypo-osmolality and hyponatremia: Secondary | ICD-10-CM | POA: Diagnosis not present

## 2021-02-01 DIAGNOSIS — J9601 Acute respiratory failure with hypoxia: Secondary | ICD-10-CM | POA: Diagnosis not present

## 2021-02-01 DIAGNOSIS — I82409 Acute embolism and thrombosis of unspecified deep veins of unspecified lower extremity: Secondary | ICD-10-CM | POA: Diagnosis not present

## 2021-02-01 NOTE — Patient Instructions (Signed)
Medication Instructions:  Your physician recommends that you continue on your current medications as directed. Please refer to the Current Medication list given to you today.  *If you need a refill on your cardiac medications before your next appointment, please call your pharmacy*   Lab Work: Your physician recommends that you return for lab work in:  TODAY: [facility], [place]  If you have labs (blood work) drawn today and your tests are completely normal, you will receive your results only by: MyChart Message (if you have [place]) OR A paper copy in the mail If you have any lab test that is abnormal or we need to change your treatment, we will call you to review the results.   Testing/Procedures: None   Follow-Up: At [facility], you and your health needs are our priority.  As part of our continuing mission to provide you with exceptional heart care, we have created designated Provider Care Teams.  These Care Teams include your primary Cardiologist (physician) and Advanced Practice Providers (APPs -  Physician Assistants and Nurse Practitioners) who all work together to provide you with the care you need, when you need it.  We recommend signing up for the patient portal called "MyChart".  Sign up information is provided on this After Visit Summary.  MyChart is used to connect with patients for Virtual Visits (Telemedicine).  Patients are able to view lab/test results, encounter notes, upcoming appointments, etc.  Non-urgent messages can be sent to your provider as well.   To learn more about what you can do with MyChart, go to [email or web address].

## 2021-02-01 NOTE — Progress Notes (Signed)
Cardiology Office Note:    Date:  02/01/2021   ID:  MAHDI FRYE, DOB 27-May-1946, MRN [id number]  PCP:  Mackie Pai, PA-C  Cardiologist:  Jenne Campus, MD    Referring MD: Elise Benne   Chief Complaint  Patient presents with   Medication Management    History of Present Illness:    ROSARIO KUSHNER is a 75 y.o. male with past medical history significant for paroxysmal atrial fibrillation, hypertension, diabetes, dyslipidemia.  He requested to have an EKG done secondary to some palpitations and dizziness.  EKG done he however said that recently he is medication remaining digoxin has been cut down and he is feeling significantly better.  His EKG today shows sinus rhythm with some APCs but no significant abnormalities.  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   Esophageal reflux 02/10/2015   Fatigue    HTN (hypertension)    Hyperglycemia    preDM    Hyperlipidemia    Insomnia    Low back pain radiating to both legs 01/16/2017   Measles as a child   Overweight(278.02)    Peripheral neuropathy 08/07/2019   Personal history of colonic polyps 10/27/2012   Follows with [facility]   Pneumonia    " walking"   Preventative health care    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   Wears glasses     Past Surgical History:  Procedure Laterality Date   BACK SURGERY  2012 and 1994   Dr Margret Chance, screws and cage in low back   BIOPSY  01/09/2020   Procedure: BIOPSY;  Surgeon: Otis Brace, MD;  [facility];   Service: Gastroenterology;;   BRONCHIAL BIOPSY  11/17/2020   Procedure: BRONCHIAL BIOPSIES;  Surgeon: Laurin Coder, MD;  [facility];  Service: Endoscopy;;   BRONCHIAL BRUSHINGS  11/17/2020   Procedure: BRONCHIAL BRUSHINGS;  Surgeon: Laurin Coder, MD;  [facility];  Service: Endoscopy;;   BRONCHIAL WASHINGS  11/17/2020   Procedure: BRONCHIAL WASHINGS;  Surgeon: Laurin Coder, MD;  [facility];  Service: Endoscopy;;   COLONOSCOPY WITH PROPOFOL N/A 01/09/2020   Procedure: COLONOSCOPY WITH PROPOFOL;  Surgeon: Otis Brace, MD;  [facility];  Service: Gastroenterology;  Laterality: N/A;   ESOPHAGOGASTRODUODENOSCOPY (EGD) WITH PROPOFOL N/A 01/09/2020   Procedure: ESOPHAGOGASTRODUODENOSCOPY (EGD) WITH PROPOFOL;  Surgeon: Otis Brace, MD;  [facility];  Service: Gastroenterology;  Laterality: N/A;   EYE SURGERY Bilateral    2016   HARDWARE REVISION  03/12/2019   REVISION OF HARDWARE THORACIC TEN-THORACIC ELEVEN WITH APPLICATION OF ADDITIONAL RODS AND ROD SLEEVES THORACIC EIGHT-THORACIC TWELVE 12-LUMBAR ONE (N/A )   HEMOSTASIS CONTROL  11/17/2020   Procedure: HEMOSTASIS CONTROL;  Surgeon: Laurin Coder, MD;  [facility];  Service: Endoscopy;;   IR US GUIDE BX ASP/DRAIN  11/13/2020   LAMINECTOMY  02/12/2019   DECOMPRESSIVE LAMINECTOMY THORACIC NINE-THORACIC TEN AND THORACIC TEN-THORACIC ELEVEN, EXTENSION OF THORACOLUMBAR FUSION FROM THORACIC TEN TO THORACIC FIVE, LOCAL  BONE GRAFT, ALLOGRAFT AND VIVIGEN (N/A)   POLYPECTOMY  01/09/2020   Procedure: POLYPECTOMY;  Surgeon: Otis Brace, MD;  [facility];  Service: Gastroenterology;;   REMOVAL OF RODS AND PEDICLE SCREWS T5 AND T6, EXPLORATION OF FUSION, BIOPSY TRANSPEDICULAR T5 AND T6 (N/A )  09/14/2020   SPINAL FUSION N/A 02/12/2019   Procedure: DECOMPRESSIVE LAMINECTOMY THORACIC NINE-THORACIC TEN AND THORACIC TEN-THORACIC ELEVEN, EXTENSION OF  THORACOLUMBAR FUSION FROM THORACIC TEN TO THORACIC FIVE, LOCAL BONE GRAFT, ALLOGRAFT AND VIVIGEN;  Surgeon: Jessy Oto, MD;  Location: [facility];  Service: Orthopedics;  Laterality: N/A;  DECOMPRESSIVE LAMINECTOMY THORACIC NINE-THORACIC TEN AND THORACIC TEN-THORACIC ELEVEN, EXTENSION OF TH   SPINAL FUSION N/A 03/12/2019   Procedure: REVISION OF HARDWARE THORACIC TEN-THORACIC ELEVEN WITH APPLICATION OF ADDITIONAL RODS AND ROD SLEEVES THORACIC EIGHT-THORACIC TWELVE 12-LUMBAR ONE;  Surgeon: Jessy Oto, MD;  Location: [facility];  Service: Orthopedics;  Laterality: N/A;   TONSILLECTOMY     as child   torn rotator cuff  2010   right   VIDEO BRONCHOSCOPY N/A 11/17/2020   Procedure: VIDEO BRONCHOSCOPY WITH FLUORO;  Surgeon: Laurin Coder, MD;  [facility];  Service: Endoscopy;  Laterality: N/A;    Current Medications: Current Meds  Medication Sig   atorvastatin (LIPITOR) 10 MG tablet TAKE 1 TABLET BY MOUTH EVERY DAY (Patient taking differently: Take 10 mg by mouth daily.)   cetirizine (ZYRTEC) 10 MG tablet Take 10 mg by mouth daily. In the morning   dapsone 100 MG tablet TAKE 1 TABLET BY MOUTH EVERY DAY (Patient taking differently: Take 100 mg by mouth daily.)   digoxin (LANOXIN) 0.125 MG tablet Take 1 tablet (0.125 mg total) by mouth daily.   ferrous fumarate (FERRETTS) 325 (106 Fe) MG TABS tablet Take 1 tablet (106 mg of iron total) by mouth daily.   pantoprazole (PROTONIX) 40 MG tablet TAKE 1 TABLET BY MOUTH TWICE A DAY BEFORE A MEAL (Patient taking differently: 40 mg 2 (two) times daily.)   pregabalin (LYRICA) 75 MG capsule TAKE 1 CAPSULE BY MOUTH TWICE A DAY (Patient taking differently: Take 75 mg by mouth 2 (two) times daily.)   PROVENTIL HFA 108 (90 Base) MCG/ACT inhaler TAKE 2 PUFFS BY MOUTH EVERY 6 HOURS AS NEEDED FOR WHEEZE OR SHORTNESS OF BREATH (Patient taking differently: Inhale 2 puffs into the lungs every 6 (six) hours as needed for wheezing or shortness of breath.)    rivaroxaban (XARELTO) 20 MG TABS tablet Take 1 tablet (20 mg total) by mouth daily with supper.   sucralfate (CARAFATE) 1 g tablet TAKE 1 TABLET (1 G TOTAL) BY MOUTH 4 (FOUR) TIMES DAILY - WITH MEALS AND AT BEDTIME.   tamsulosin (FLOMAX) 0.4 MG CAPS capsule Take 2 capsules (0.8 mg total) by mouth daily.   testosterone cypionate (DEPOTESTOSTERONE CYPIONATE) 200 MG/ML injection INJECT 1 ML (200 MG TOTAL) INTO THE MUSCLE EVERY 14 (FOURTEEN) DAYS.   tiZANidine (ZANAFLEX) 4 MG tablet Take 1.5 tablets (6 mg total) by mouth every 6 (six) hours as needed for muscle spasms.   venlafaxine XR (EFFEXOR-XR) 150 MG 24 hr capsule TAKE 1 CAPSULE (150 MG TOTAL) BY MOUTH DAILY WITH BREAKFAST.   Current Facility-Administered Medications for the 02/01/21 encounter (Office Visit) with Park Liter, MD  Medication   testosterone cypionate (DEPOTESTOSTERONE CYPIONATE) injection 200 mg     Allergies:   Patient has no known allergies.   Social History   Socioeconomic History   Marital status: Married    Spouse  name: Not on file   Number of children: Not on file   Years of education: Not on file   Highest education level: Not on file  Occupational History   Not on file  Tobacco Use   Smoking status: Former    Packs/day: 1.00    Years: 20.00    Pack years: 20.00    Types: Cigarettes    Quit date: 07/18/1989    Years since quitting: 31.5   Smokeless tobacco: Never   Tobacco comments:    off and on smoking for the 20 yrs  Vaping Use   Vaping Use: Never used  Substance and Sexual Activity   Alcohol use: Not Currently   Drug use: No   Sexual activity: Yes    Comment: lives with wife, still working, no dietary restrictions, continues to exercise intermittently  Other Topics Concern   Not on file  Social History Narrative   Not on file   Social Determinants of Health   Financial Resource Strain: Low Risk    Difficulty of Paying Living Expenses: Not hard at all  Food Insecurity: No Food  Insecurity   Worried About Charity fundraiser in the Last Year: Never true   Arboriculturist in the Last Year: Never true  Transportation Needs: No Transportation Needs   Lack of Transportation (Medical): No   Lack of Transportation (Non-Medical): No  Physical Activity: Sufficiently Active   Days of Exercise per Week: 7 days   Minutes of Exercise per Session: 30 min  Stress: No Stress Concern Present   Feeling of Stress : Not at all  Social Connections: Moderately Integrated   Frequency of Communication with Friends and Family: More than three times a week   Frequency of Social Gatherings with Friends and Family: More than three times a week   Attends Religious Services: More than 4 times per year   Active Member of [employer] or Organizations: No   Attends Music therapist: Never   Marital Status: Married     Family History: The patient's family history includes COPD in his father; Cancer (age of onset: 65) in his mother; Diabetes in his mother; Emphysema in his father; Heart disease in his daughter, father, maternal grandfather, and paternal grandfather; Hyperlipidemia in his sister; Hypertension in his maternal grandmother, mother, and sister; Osteoporosis in his sister; Scoliosis in his maternal grandmother and sister; Stroke (age of onset: 24) in his father. ROS:   Please see the history of present illness.    All 14 point review of systems negative except as described per history of present illness  EKGs/Labs/Other Studies Reviewed:      Recent Labs: 11/10/2020: B Natriuretic Peptide 68.6 11/21/2020: Magnesium 2.2 12/31/2020: TSH 2.36 01/05/2021: ALT 21; BUN 16; Creatinine, Ser 0.56; Potassium 4.5; Sodium 134 01/07/2021: Hemoglobin 11.8; Platelets 450.0  Recent Lipid Panel    Component Value Date/Time   CHOL 112 07/01/2020 0857   TRIG 66.0 07/01/2020 0857   HDL 56.10 07/01/2020 0857   CHOLHDL 2 07/01/2020 0857   VLDL 13.2 07/01/2020 0857   LDLCALC 42 07/01/2020  0857   LDLDIRECT 64.0 10/30/2017 1502    Physical Exam:    VS:  BP (!) 144/76 (BP Location: Right Arm, Patient Position: Sitting)   Pulse 81   Ht 5\' 7"  (1.702 m)   Wt 166 lb (75.3 kg)   SpO2 95%   BMI 26.00 kg/m     Wt Readings from Last 3 Encounters:  02/01/21 166  lb (75.3 kg)  01/29/21 163 lb (73.9 kg)  01/27/21 163 lb (73.9 kg)     GEN:  Well nourished, well developed in no acute distress HEENT: Normal NECK: No JVD; No carotid bruits LYMPHATICS: No lymphadenopathy CARDIAC: RRR, no murmurs, no rubs, no gallops RESPIRATORY:  Clear to auscultation without rales, wheezing or rhonchi  ABDOMEN: Soft, non-tender, non-distended MUSCULOSKELETAL:  No edema; No deformity  SKIN: Warm and dry LOWER EXTREMITIES: no swelling NEUROLOGIC:  Alert and oriented x 3 PSYCHIATRIC:  Normal affect   ASSESSMENT:    1. PAF (paroxysmal atrial fibrillation) (HCC)   2. Palpitations   3. Essential hypertension    PLAN:    In order of problems listed above:  Paroxysmal atrial fibrillation EKG showed today sinus rhythm.  Will be followed by her primary cardiologist. Palpitations: Much better right now after cutting down some of the AV controlling agent. Essential hypertension seems to be controlled   Medication Adjustments/Labs and Tests Ordered: Current medicines are reviewed at length with the patient today.  Concerns regarding medicines are outlined above.  Orders Placed This Encounter  Procedures   Basic metabolic panel   Magnesium   EKG 12-Lead   Medication changes: No orders of the defined types were placed in this encounter.   Signed, Park Liter, MD, [facility] 02/01/2021 10:08 AM    [place]

## 2021-02-02 DIAGNOSIS — I480 Paroxysmal atrial fibrillation: Secondary | ICD-10-CM

## 2021-02-02 LAB — BASIC METABOLIC PANEL
BUN/Creatinine Ratio: 19 (ref 10–24)
BUN: 12 mg/dL (ref 8–27)
CO2: 27 mmol/L (ref 20–29)
Calcium: 9.5 mg/dL (ref 8.6–10.2)
Chloride: 98 mmol/L (ref 96–106)
Creatinine, Ser: 0.63 mg/dL — ABNORMAL LOW (ref 0.76–1.27)
Glucose: 115 mg/dL — ABNORMAL HIGH (ref 65–99)
Potassium: 4.4 mmol/L (ref 3.5–5.2)
Sodium: 138 mmol/L (ref 134–144)
eGFR: 98 mL/min/{1.73_m2} (ref >59)

## 2021-02-02 LAB — MAGNESIUM: Magnesium: 1.6 mg/dL (ref 1.6–2.3)

## 2021-02-03 DIAGNOSIS — I82409 Acute embolism and thrombosis of unspecified deep veins of unspecified lower extremity: Secondary | ICD-10-CM | POA: Diagnosis not present

## 2021-02-03 DIAGNOSIS — J9601 Acute respiratory failure with hypoxia: Secondary | ICD-10-CM | POA: Diagnosis not present

## 2021-02-03 DIAGNOSIS — E785 Hyperlipidemia, unspecified: Secondary | ICD-10-CM | POA: Diagnosis not present

## 2021-02-03 DIAGNOSIS — Z7982 Long term (current) use of aspirin: Secondary | ICD-10-CM | POA: Diagnosis not present

## 2021-02-03 DIAGNOSIS — E871 Hypo-osmolality and hyponatremia: Secondary | ICD-10-CM | POA: Diagnosis not present

## 2021-02-03 DIAGNOSIS — Z7984 Long term (current) use of oral hypoglycemic drugs: Secondary | ICD-10-CM | POA: Diagnosis not present

## 2021-02-03 DIAGNOSIS — I10 Essential (primary) hypertension: Secondary | ICD-10-CM | POA: Diagnosis not present

## 2021-02-03 DIAGNOSIS — E1142 Type 2 diabetes mellitus with diabetic polyneuropathy: Secondary | ICD-10-CM | POA: Diagnosis not present

## 2021-02-03 DIAGNOSIS — M5137 Other intervertebral disc degeneration, lumbosacral region: Secondary | ICD-10-CM | POA: Diagnosis not present

## 2021-02-03 DIAGNOSIS — G4700 Insomnia, unspecified: Secondary | ICD-10-CM | POA: Diagnosis not present

## 2021-02-04 ENCOUNTER — Other Ambulatory Visit: Payer: Self-pay

## 2021-02-04 ENCOUNTER — Ambulatory Visit (INDEPENDENT_AMBULATORY_CARE_PROVIDER_SITE_OTHER): Payer: Medicare Other | Admitting: Internal Medicine

## 2021-02-04 ENCOUNTER — Encounter: Payer: Self-pay | Admitting: Internal Medicine

## 2021-02-04 VITALS — BP 168/81 | HR 89 | Temp 98.1°F | Wt 167.0 lb

## 2021-02-04 DIAGNOSIS — J8281 Chronic eosinophilic pneumonia: Secondary | ICD-10-CM

## 2021-02-04 DIAGNOSIS — M4624 Osteomyelitis of vertebra, thoracic region: Secondary | ICD-10-CM

## 2021-02-04 DIAGNOSIS — Z298 Encounter for other specified prophylactic measures: Secondary | ICD-10-CM

## 2021-02-04 DIAGNOSIS — Z2989 Encounter for other specified prophylactic measures: Secondary | ICD-10-CM

## 2021-02-04 NOTE — Progress Notes (Signed)
[facility]  CHIEF COMPLAINT:    Follow up for hx of back infection and eosinophilic pneumonia  SUBJECTIVE:    Luis Brown is a 75 y.o. male with PMHx as below who presents to the clinic for hx of back infection and eosinophilic pneumonia.   Regarding eosinophilic pneumonia:  he was treated with high dose steroids requiring PCP prophylaxis.  He did not tolerate Bactrim due to side effects/lab abnormalities so we started him on Dapsone instead which he has tolerated.  He is no longer on steroids as of about 2 weeks ago.  Continues on Dapsone pending follow up today.  Regarding back pain: he is currently off all antibiotics as of May.  Previously his inflammatory markers had normalized but this was probably somewhat masked by being on high dose steroids.  Last month in June his CRP was 34 and ESR 17.  He presents today for follow up and continues to have back pain that is fairly stable without any acute worsening, however, the past 4 weeks he has experienced muscle spasm in his back that can also create some chest soreness.  Takes tizandidine and pain medication which provides some relief.  His PT told him he needs to slow down a little bit.  He saw Dr Louanne Skye on 01/29/21 and will follow up again in 4 months as everything appeared stable.     Please see A&P for the details of today's visit and status of the patient's medical problems.   Patient's Medications  New Prescriptions   No medications on file  Previous Medications   ALPRAZOLAM (XANAX) 0.5 MG TABLET    TAKE 1 TABLET BY MOUTH 2 TIMES DAILY AS NEEDED FOR ANXIETY.   ATORVASTATIN (LIPITOR) 10 MG TABLET    TAKE 1 TABLET BY MOUTH EVERY DAY   B COMPLEX VITAMINS CAPSULE    Take 1 capsule by mouth daily.   BUSPIRONE (BUSPAR) 7.5 MG TABLET    1-2 TAB BY MOUTH 3 TIMES A DAY FOR ANXIETY   CETIRIZINE (ZYRTEC) 10 MG TABLET    Take 10 mg by mouth daily. In the morning   DIGOXIN (LANOXIN) 0.125 MG TABLET     Take 1 tablet (0.125 mg total) by mouth daily.   FAMOTIDINE (PEPCID) 40 MG TABLET    TAKE 1 TABLET BY MOUTH EVERYDAY AT BEDTIME   FERROUS FUMARATE (FERRETTS) 325 (106 FE) MG TABS TABLET    Take 1 tablet (106 mg of iron total) by mouth daily.   GLUCOSE BLOOD (CONTOUR NEXT TEST) TEST STRIP    Check blood sugar once daily   HYDROCODONE-ACETAMINOPHEN (NORCO) 7.5-325 MG TABLET    Take 1 tablet by mouth every 4 (four) hours as needed for up to 7 days for moderate pain ((score 4 to 6)).   JANUVIA 50 MG TABLET    TAKE 1 TABLET BY MOUTH EVERY DAY   METFORMIN (GLUCOPHAGE) 1000 MG TABLET    Take 1 tablet (1,000 mg total) by mouth 2 (two) times daily with a meal.   NYSTATIN CREAM (MYCOSTATIN)    Apply 1 application topically 2 (two) times daily.   PANTOPRAZOLE (PROTONIX) 40 MG TABLET    TAKE 1 TABLET BY MOUTH TWICE A DAY BEFORE A MEAL   PREGABALIN (LYRICA) 75 MG CAPSULE    TAKE 1 CAPSULE BY MOUTH TWICE A DAY   PROVENTIL HFA 108 (90 BASE) MCG/ACT INHALER    TAKE 2 PUFFS BY MOUTH EVERY 6  HOURS AS NEEDED FOR WHEEZE OR SHORTNESS OF BREATH   RIVAROXABAN (XARELTO) 20 MG TABS TABLET    Take 1 tablet (20 mg total) by mouth daily with supper.   SODIUM CHLORIDE (OCEAN) 0.65 % SOLN NASAL SPRAY    Place 1 spray into both nostrils as needed for congestion.   SUCRALFATE (CARAFATE) 1 G TABLET    TAKE 1 TABLET (1 G TOTAL) BY MOUTH 4 (FOUR) TIMES DAILY - WITH MEALS AND AT BEDTIME.   TAMSULOSIN (FLOMAX) 0.4 MG CAPS CAPSULE    Take 2 capsules (0.8 mg total) by mouth daily.   TERBINAFINE (LAMISIL) 250 MG TABLET    Take 1 tablet (250 mg total) by mouth daily.   TESTOSTERONE CYPIONATE (DEPOTESTOSTERONE CYPIONATE) 200 MG/ML INJECTION    INJECT 1 ML (200 MG TOTAL) INTO THE MUSCLE EVERY 14 (FOURTEEN) DAYS.   TIZANIDINE (ZANAFLEX) 4 MG TABLET    Take 1.5 tablets (6 mg total) by mouth every 6 (six) hours as needed for muscle spasms.   VENLAFAXINE XR (EFFEXOR-XR) 150 MG 24 HR CAPSULE    TAKE 1 CAPSULE (150 MG TOTAL) BY MOUTH DAILY WITH  BREAKFAST.  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   Esophageal reflux 02/10/2015   Fatigue    HTN (hypertension)    Hyperglycemia    preDM    Hyperlipidemia    Insomnia    Low back pain radiating to both legs 01/16/2017   Measles as a child   Overweight(278.02)    Peripheral neuropathy 08/07/2019   Personal history of colonic polyps 10/27/2012   Follows with [facility]   Pneumonia    " walking"   Preventative health care    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   Wears glasses     Social History   Tobacco Use   Smoking status: Former    Packs/day: 1.00    Years: 20.00    Pack years: 20.00    Types: Cigarettes    Quit date: 07/18/1989    Years since quitting: 31.5   Smokeless tobacco: Never   Tobacco comments:    off and on smoking for the 20 yrs  Vaping Use   Vaping Use: Never used  Substance Use Topics   Alcohol use: Not Currently   Drug use: No    Family History  Problem Relation Age of Onset   Hypertension Mother    Diabetes Mother        type 2   Cancer Mother 39       breast in remission   Emphysema Father        smoker   COPD Father        smoker   Stroke Father 71       mini   Heart disease Father    Hypertension Sister  Hyperlipidemia Sister    Scoliosis Sister    Osteoporosis Sister    Hypertension Maternal Grandmother    Scoliosis Maternal Grandmother     Heart disease Maternal Grandfather    Heart disease Paternal Grandfather        smoker   Heart disease Daughter     Allergies  Allergen Reactions   Daptomycin     Drug induced pneumonia     Review of Systems  Constitutional:  Negative for chills and fever.  Respiratory: Negative.    Musculoskeletal:  Positive for back pain.    OBJECTIVE:    Vitals:   02/04/21 1435  BP: (!) 168/81  Pulse: 89  Temp: 98.1 F (36.7 C)  TempSrc: Oral  Weight: 167 lb (75.8 kg)   Body mass index is 26.16 kg/m.  Physical Exam Constitutional:      General: He is not in acute distress.    Appearance: Normal appearance.  Pulmonary:     Effort: Pulmonary effort is normal. No respiratory distress.  Musculoskeletal:     Comments: Right sided thoracic paraspinal muscle tension without much tenderness.  Skin:    General: Skin is warm and dry.     Findings: No rash.  Neurological:     General: No focal deficit present.     Mental Status: He is alert and oriented to person, place, and time.  Psychiatric:        Mood and Affect: Mood normal.        Behavior: Behavior normal.     Labs and Microbiology: CBC Latest Ref Rng & Units 01/07/2021 01/05/2021 12/21/2020  WBC 4.0 - 10.5 K/uL 8.9 7.7 9.5  Hemoglobin 13.0 - 17.0 g/dL 11.8(L) 11.1(L) 13.0  Hematocrit 39.0 - 52.0 % 35.4(L) 33.3(L) 39.3  Platelets 150.0 - 400.0 K/uL 450.0(H) 367.0 287.0   CMP Latest Ref Rng & Units 02/01/2021 01/05/2021 12/21/2020  Glucose 65 - 99 mg/dL 115(H) 187(H) 243(H)  BUN 8 - 27 mg/dL $Remove'12 16 15  'AiOayNQ$ Creatinine 0.76 - 1.27 mg/dL 0.63(L) 0.56 0.67  Sodium 134 - 144 mmol/L 138 134(L) 129(L)  Potassium 3.5 - 5.2 mmol/L 4.4 4.5 4.9  Chloride 96 - 106 mmol/L 98 95(L) 89(L)  CO2 20 - 29 mmol/L $RemoveB'27 31 28  'dAAPxMrf$ Calcium 8.6 - 10.2 mg/dL 9.5 9.3 9.1  Total Protein 6.0 - 8.3 g/dL - 5.9(L) 5.7(L)  Total Bilirubin 0.2 - 1.2 mg/dL - 0.9 1.6(H)  Alkaline Phos 39 - 117 U/L - 65 59  AST 0 - 37 U/L - 15 9  ALT 0 - 53 U/L - 21 7        ASSESSMENT & PLAN:    1. 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HCC) Will continue to monitor off antibiotics.  Back pain remains fairly stable.  He does have some tension and spasm-like pain which seems to improved with tizanidine that he will monitor.  Repeat inflammatory markers and follow up in 4 months, sooner if needed.  2. 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  3. Need for pneumocystis prophylaxis Will stop dapsone since he has been off prednisone now for 2 weeks.    Orders Placed This Encounter  Procedures   Sedimentation rate   C-reactive protein       [facility] 02/04/2021, 2:59 PM   I spent 30 minutes  dedicated to the care of this patient on the date of this encounter to include pre-visit review of records, face-to-face time with the patient discussing discitis, need for PCP ppx, eosinophilic pneumonia, and post-visit ordering of testing.

## 2021-02-04 NOTE — Patient Instructions (Signed)
Thank you for coming to see me today. It was a pleasure seeing you.  To Do: Labs today STOP taking Dapsone.  It is no longer needed since you're off steroids Follow up in 4 months  If you have any questions or concerns, please do not hesitate to call the office at [phone].  Take Care,   Jule Ser

## 2021-02-05 ENCOUNTER — Telehealth: Payer: Self-pay

## 2021-02-05 ENCOUNTER — Telehealth: Payer: Self-pay | Admitting: Cardiology

## 2021-02-05 DIAGNOSIS — I480 Paroxysmal atrial fibrillation: Secondary | ICD-10-CM | POA: Diagnosis not present

## 2021-02-05 LAB — C-REACTIVE PROTEIN: CRP: 30.4 mg/L — ABNORMAL HIGH (ref <8.0)

## 2021-02-05 LAB — SEDIMENTATION RATE: Sed Rate: 6 mm/h (ref 0–20)

## 2021-02-05 NOTE — Telephone Encounter (Signed)
I rhythm is critical lab result

## 2021-02-05 NOTE — Telephone Encounter (Signed)
Relayed to patient per Dr. Wallace that his labs from yesterday are reassuring. Advised that his ESR remains normal and that his CRP is slightly elevated. Advised that both are stable and Dr. Wallace would like to continue to monitor him off of antibiotics as discussed. Reminded patient of upcoming appointment in November. Patient verbalized understanding and has no further questions.   Megan D Burroughs, RN  

## 2021-02-05 NOTE — Telephone Encounter (Signed)
[facility] called and states that the zio patch showed rapid atrial flutter lasting 60 secs on strip 196. You can view on Zio.

## 2021-02-05 NOTE — Telephone Encounter (Signed)
-----  Message from Mignon Pine, DO sent at 02/05/2021 11:28 AM EDT ----- Please let patient know that labs yesterday are reassuring.  His ESR remains normal and CRP is minimally elevated.  Both are stable from last month.  Plan is to continue to monitor off antibiotics as discussed during appointment yesterda.

## 2021-02-07 ENCOUNTER — Other Ambulatory Visit: Payer: Self-pay | Admitting: Medical

## 2021-02-08 ENCOUNTER — Other Ambulatory Visit: Payer: Self-pay | Admitting: Medical

## 2021-02-08 DIAGNOSIS — J9601 Acute respiratory failure with hypoxia: Secondary | ICD-10-CM | POA: Diagnosis not present

## 2021-02-08 DIAGNOSIS — E871 Hypo-osmolality and hyponatremia: Secondary | ICD-10-CM | POA: Diagnosis not present

## 2021-02-08 DIAGNOSIS — Z7984 Long term (current) use of oral hypoglycemic drugs: Secondary | ICD-10-CM | POA: Diagnosis not present

## 2021-02-08 DIAGNOSIS — M5137 Other intervertebral disc degeneration, lumbosacral region: Secondary | ICD-10-CM | POA: Diagnosis not present

## 2021-02-08 DIAGNOSIS — G4700 Insomnia, unspecified: Secondary | ICD-10-CM | POA: Diagnosis not present

## 2021-02-08 DIAGNOSIS — I82409 Acute embolism and thrombosis of unspecified deep veins of unspecified lower extremity: Secondary | ICD-10-CM | POA: Diagnosis not present

## 2021-02-08 DIAGNOSIS — E785 Hyperlipidemia, unspecified: Secondary | ICD-10-CM | POA: Diagnosis not present

## 2021-02-08 DIAGNOSIS — E1142 Type 2 diabetes mellitus with diabetic polyneuropathy: Secondary | ICD-10-CM | POA: Diagnosis not present

## 2021-02-08 DIAGNOSIS — Z7982 Long term (current) use of aspirin: Secondary | ICD-10-CM | POA: Diagnosis not present

## 2021-02-08 DIAGNOSIS — I10 Essential (primary) hypertension: Secondary | ICD-10-CM | POA: Diagnosis not present

## 2021-02-09 ENCOUNTER — Other Ambulatory Visit: Payer: Self-pay

## 2021-02-09 ENCOUNTER — Ambulatory Visit (INDEPENDENT_AMBULATORY_CARE_PROVIDER_SITE_OTHER): Payer: Medicare Other

## 2021-02-09 ENCOUNTER — Ambulatory Visit: Payer: Medicare Other | Admitting: Primary Care

## 2021-02-09 ENCOUNTER — Encounter: Payer: Self-pay | Admitting: Primary Care

## 2021-02-09 VITALS — BP 142/70 | HR 67 | Temp 98.7°F | Ht 67.0 in | Wt 163.6 lb

## 2021-02-09 DIAGNOSIS — J984 Other disorders of lung: Secondary | ICD-10-CM

## 2021-02-09 DIAGNOSIS — J449 Chronic obstructive pulmonary disease, unspecified: Secondary | ICD-10-CM | POA: Diagnosis not present

## 2021-02-09 DIAGNOSIS — J189 Pneumonia, unspecified organism: Secondary | ICD-10-CM

## 2021-02-09 DIAGNOSIS — T50905A Adverse effect of unspecified drugs, medicaments and biological substances, initial encounter: Secondary | ICD-10-CM

## 2021-02-09 MED ORDER — SPIRIVA RESPIMAT 2.5 MCG/ACT IN AERS: 2.0000 | INHALATION_SPRAY | Freq: Every day | RESPIRATORY_TRACT | 0 refills | Status: DC

## 2021-02-09 NOTE — Progress Notes (Signed)
I tried patient but no answer. Please let patient know CXR showed resolution of previously seen infiltrates. Mild scarring.

## 2021-02-09 NOTE — Patient Instructions (Addendum)
Recommendations: - Trial inhaler called Spiriva Respimat - take 2 puffs once daily in the morning - Continue to use Albuterol rescue inhaler 2 puffs every 6 hours as needed for breakthrough shortness of breath or wheezing  - NO longer need oxygen  Orders: - CXR today - Simple walk test   Follow-up: - 2 week televisit with Eustaquio Maize NP

## 2021-02-09 NOTE — Progress Notes (Signed)
_0  ID: Luis Brown, male    DOB: [birth year], 75 y.o.   MRN: [id number]  Chief Complaint  Patient presents with   Follow-up    Follow up, patient reports that he is getting some better since last visit, and humidity is not helping his breathing in last 2 weeks.     Referring provider: Elise Benne  Luis: 75 year old male, former smoker (20-pack-year history).  Past medical history significant hypertension, DVT, drug-induced pneumonitis, eosinophilic pneumonia, acute respiratory failure with hypoxia, GERD, gastric ulcer, alcohol abuse, protein calorie malnutrition, hyperlipidemia, anxiety/depression.  Patient was recently admitted to [facility] from 11/10/2020 - 11/23/2020 for drug induced pneumonitis.  Hospital course 11/10/2020 - 11/23/2020: Patient presented to the hospital shortness of breath.  He was originally seen at PCPs office and found to be hypoxic.  He was referred to emergency room.  He underwent work-up with CTA chest which showed large right sided pneumonia with groundglass nodules and concern for central cavitation.  He was started on IV antibiotics on admission.  Infectious disease was consulted given prolonged antibiotic course at home and now with atypical pneumonia.  Echocardiogram was negative for valvular vegetation.  MRI was negative for deep infection but revealed a subcutaneous probable seroma.  IR was consulted per ID for aspiration/drainage and culturing of fluid.  He underwent aspiration on 11/13/2020.  Fluid cultures obtained showing no growth.  Patient hospital course complicated by right upper extremity DVT secondary to PICC line.  Patient developed worsening respiratory symptoms and increasing oxygen requirements.  He underwent repeat CT chest which showed worsening right-sided pneumonia.  Pulmonary was consulted and he underwent bronchoscopy on 11/17/2020.  Bronchial washings were generally unremarkable.  There was discussion regarding possibly underlying  daptomycin pneumonitis.  Daptomycin was discontinued when cultures patient was de-escalated to Bactrim per ID recommendations.  Patient will follow-up outpatient with ID for ongoing antibiotic management.  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  Patient was discharged home on oxygen.  Will remain on steroids until follow-up with pulmonary on May 17.  He was discharged on Xarelto, given its provoked nature will likely be able to discontinue once imaging is negative in the next 3 to 6 months per protocol.  Patient was discharged on Bactrim and Xanax given profound anxiety in relation to dyspnea.  Patient is aware Xanax is not and should not be permanent fixture or addition to his home medication list and will need follow-up with PCP to manage prescriptions further.   12/01/2020 -Remains on prednisone 28m daily until follow-up and assess when to taper dose -Continues Bactim BID -FOB on 5/3 with negtive pathology, BAL negative -Follow up with Dr. RChase Callerin ILD clinic in June   Patient reports that he is a little better.  He continues to have some shortness of breath with occasional wheezing.  He has a productive cough which has improved.  Underlying anxiety due to dyspnea symptoms.  He has had no emergencies requiring his albuterol rescue inhaler.  He used this twice since discharge.  He is using flutter valve and incentive spirometer.  He is still on Bactrim and has follow-up with infectious disease in the next week.  Reports difficulty sleeping at night due to steroid.  He has Ambien 5 mg as needed insomnia.  He is using Xanax twice a day as needed for anxiety.  He will follow-up with primary care next Monday. His appetite has improved some. Drinking protein shakes.  Denies fever chills sweats, chest tightness or chest pain.   12/15/2020 Patient presents today for acute office visit. He had follow-up CT chest on 12/11/20  that showed small-moderate right pneumothorax, approx 15-20%. Previous right upper lobe consolidation has improved without residual consolidative components. There is persistent ground glass attenuation and cystic changes throughout RUL and ground glass in RML and lower lobes. Findings may be post infectious or inflammatory versus sequale of interstitial pneumonia. On call provider was notified of critical results. Patient was called ON 12/13/20 and made aware of the results. 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 Patient did report some increase in shortness of breath over the last few days but denies chest pain. Patient opted to wait until Tuesday for follow-up CXR. If pneumothorax still present will need ED evaluation for possible chest tube placement. He has an apt in June with Dr. Chase Caller.   He feels well today. No significant change noted to his breathing. Still able to do some chores indoors. He does not get out of breath at home. He is wearing 2L oxygen. He fell Saturday 5/28, states that he got tripped up with his dog. He hit his face on refrigerator. No LOC or change in mental status. He is on blood thinner. Neuros intact. Bruising to nose.   CXR today showed improvement apical pneumothorax that was seen on previous CT chest, possibly still present on lateral view. Had Dr. Erin Fulling review imaging. Overall reassuring. Plan continue to monitor with repeat CXR on Thursday.   12/29/20- Dr. Chase Caller Chief Complaint  Patient presents with   Follow-up    PFT  performed 5/24.  Pt states since last visit, he has been doing okay. States he has lost some weight that he has noticed since last visit.    Follow-up drug-induced pneumonitis daptomycin from early May 2022  Luis Brown 75 y.o. -returns for follow-up.  I personally saw him in the hospital on 11/20/2020.  At that point in time he had been started on Solu-Medrol on 11/18/2020.  When  I saw him he was on day 3 of Solu-Medrol and he was needing 3 L nasal cannula oxygen at rest.  At that point in time even with 3 L he was desaturating to 86% just talking.  Put him on prednisone 60 mg/day.  After that he see nurse practitioner x2 at least 1 of which was a telephone visit..  As part of his follow-up on 12/11/2020 he had a CT scan of the chest [3 days after pulmonary function test] and on my personal visualization it showed new onset pneumothorax but significant improvement in infiltrates and inflammation.  He had another follow-up chest x-ray 12/17/2020 in which no pneumothorax reported [similar on 12/15/2020] and pulmonary infiltrates appear improved.  He is now here with his wife  He tells me he is now on 10 mg/day prednisone he just darted that today.  He is 1 week left and after that the prednisone taper ends.  Because of the pneumothorax is physical therapy has been on hold but 4 days ago he was evaluated and apparently his pulse ox was 100% but this was on oxygen.  He continues to use his oxygen although he is not sure if he is desaturating anymore.  He does feel overall improved compared to 6 weeks ago but still has significant amount of fatigue.  He has significant insomnia which he believes is due to prednisone.  Today's last night was the  first night he could sleep when he dropped his prednisone down to 10 mg/day.  He believes his fatigue is multifactorial related to the disease, multiple medications, lack of oxygen, insomnia etc.  02/09/2021- Interim hx  Patient presents today for 1 month follow-up. Since his last visit in June, he has established with cardiology and was dx with afib. He is currently on Xarelto and Digoxin. His wife states that he has had more energy recently. He is scheduled for testosterone injection tomorrow. He gets winded with stairs and heavy exertion. He reports intermittent wheezing. He needs refill of albuterol rescue inhaler. He has weaned himself of oxygen and  O2 equipment has been picked up. He has been sleeping better at night. Anxiety has improved, he stopped xanax. Due for follow-up CXR today. Pulmonary function testing in May 2022 showed moderate obstructive airway disease without BD response   SYMPTOM SCALE - ILD 12/29/2020  02/09/2021   O2 use 2-3L [place] ahome RA  Shortness of Breath 0 -> 5 scale with 5 being worst (score 6 If unable to do)   At rest 0 0  Simple tasks - showers, clothes change, eating, shaving 1 0  Household (dishes, doing bed, laundry) 1 3  Shopping Not yeet 0  Walking level at own pace 2 0  Walking up Stairs 2 3  Total (30-36) Dyspnea Score 6 6  How bad is your cough? 4 1  How bad is your fatigue 0 2  How bad is nausea 0 0  How bad is vomiting?  0 0  How bad is diarrhea? 0 0  How bad is anxiety? 3 1  How bad is depression 1 0    Walking desaturation test on room air with a forehead probe today  Simple office walk 185 feet x  3 laps goal with forehead probe 12/29/2020  02/09/2021   O2 used ra RA  Number laps completed 3 3  Comments about pace steady Average pace   Resting Pulse Ox/HR 97% and 120/min 97% and 97/min  Final Pulse Ox/HR 98% and 137/min 93% and 110/min  Desaturated </= 88% no no  Desaturated <= 3% points no yes  Got Tachycardic >/= 90/min yes yes  Symptoms at end of test Dyspneic atn2nd lap SOB on 3rd lap   Miscellaneous comments x x     Imaging:  11/23/20 CXR - Persistent right pulmonary opacities and right pleural effusion.Aeration improved compared to 11/17/2020.  12/11/20- CT CHEST WO CONTRAST IMPRESSION: 1. Small to moderate right pneumothorax, approximately 15-20%. This was not present on prior chest imaging, but is not significantly changed from recent thoracic spine radiograph 12/03/2020. 2. Previous right upper lobe consolidation has improved, without residual consolidative components. There is persistent ground-glass attenuation and cystic changes throughout the right upper lobe,  with geographic pulmonary 10 UA shin and ground-glass in the right middle and lower lobes. There is mild associated right lung bronchiectasis. Findings may be due to post infectious or inflammatory sequela, versus sequela of interstitial pneumonia. 3. Improved mediastinal adenopathy from prior exam. 4. Faint ground-glass attenuation in the lingula and anterior left upper lobe was present on prior and mildly improved, likely infectious or inflammatory. 5. Stable pulmonary nodules dating back to June 2021 exam   Allergies  Allergen Reactions   Daptomycin     Drug induced pneumonia     Immunization History  Administered Date(s) Administered   Fluad Quad(high Dose 65+) 04/05/2019, 06/29/2020   Influenza Split 04/23/2012   Influenza Whole 02/16/2011  Influenza, High Dose Seasonal PF 05/06/2013, 03/25/2016, 05/05/2017, 06/07/2018   PFIZER(Purple Top)SARS-COV-2 Vaccination 08/23/2019, 09/13/2019, 06/07/2020   Pneumococcal Conjugate-13 03/25/2016   Pneumococcal Polysaccharide-23 01/20/2009, 10/19/2011   Td 07/09/2003, 07/18/2008   Tdap 07/20/2011   Zoster, Live 04/27/2016    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   Esophageal reflux 02/10/2015   Fatigue    HTN (hypertension)    Hyperglycemia    preDM    Hyperlipidemia    Insomnia    Low back pain radiating to both legs 01/16/2017   Measles as a child   Overweight(278.02)    Peripheral neuropathy 08/07/2019   Personal history of colonic polyps 10/27/2012   Follows with [facility]   Pneumonia    " walking"   Preventative health care    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   Wears glasses     Tobacco History: Social History   Tobacco Use  Smoking Status Former   Packs/day: 1.00   Years: 20.00   Pack years: 20.00   Types: Cigarettes   Quit date: 07/18/1989   Years since quitting: 31.5  Smokeless Tobacco Never   Counseling given: Not Answered   Outpatient Medications Prior to Visit  Medication Sig Dispense Refill   ALPRAZolam (XANAX) 0.5 MG tablet TAKE 1 TABLET BY MOUTH 2 TIMES DAILY AS NEEDED FOR ANXIETY. 30 tablet 0   atorvastatin (LIPITOR) 10 MG tablet TAKE 1 TABLET BY MOUTH EVERY DAY 30 tablet 0   b complex vitamins capsule Take 1 capsule by mouth daily.     cetirizine (ZYRTEC) 10 MG tablet Take 10 mg by mouth daily. In the morning     digoxin (LANOXIN) 0.125 MG tablet Take 1 tablet (0.125 mg total) by mouth daily. 90 tablet 3   ferrous fumarate (FERRETTS) 325 (106 Fe) MG TABS tablet Take 1 tablet (106 mg of iron total) by mouth daily. 90 tablet 3   glucose blood (CONTOUR NEXT TEST) test strip Check blood sugar once daily 100 each 12   JANUVIA 50 MG tablet TAKE 1 TABLET BY MOUTH EVERY DAY 30 tablet 3   metFORMIN (GLUCOPHAGE) 1000 MG tablet Take 1 tablet (1,000 mg total) by mouth 2 (two) times daily with a meal. 180 tablet 0   pantoprazole (PROTONIX) 40 MG tablet TAKE 1 TABLET BY MOUTH TWICE A DAY BEFORE A MEAL (Patient taking differently: 40 mg 2 (two) times daily.) 180 tablet 1   pregabalin (LYRICA) 75 MG capsule TAKE 1 CAPSULE BY MOUTH TWICE A DAY (Patient taking differently: Take 75 mg by mouth 2 (two) times daily.) 60 capsule 3   PROVENTIL HFA 108 (90 Base) MCG/ACT inhaler TAKE 2 PUFFS BY MOUTH EVERY 6 HOURS AS NEEDED FOR WHEEZE OR SHORTNESS OF BREATH (Patient taking differently: Inhale 2 puffs into the lungs every 6 (six) hours as needed for wheezing or shortness of breath.) 6.7 each 5   rivaroxaban (XARELTO) 20 MG TABS tablet Take 1  tablet (20 mg total) by mouth daily with  supper. 90 tablet 3   sodium chloride (OCEAN) 0.65 % SOLN nasal spray Place 1 spray into both nostrils as needed for congestion. 20 mL 0   sucralfate (CARAFATE) 1 g tablet TAKE 1 TABLET (1 G TOTAL) BY MOUTH 4 (FOUR) TIMES DAILY - WITH MEALS AND AT BEDTIME. 120 tablet 1   tamsulosin (FLOMAX) 0.4 MG CAPS capsule Take 2 capsules (0.8 mg total) by mouth daily. 180 capsule 3   testosterone cypionate (DEPOTESTOSTERONE CYPIONATE) 200 MG/ML injection INJECT 1 ML (200 MG TOTAL) INTO THE MUSCLE EVERY 14 (FOURTEEN) DAYS. 2 mL 0   tiZANidine (ZANAFLEX) 4 MG tablet Take 1.5 tablets (6 mg total) by mouth every 6 (six) hours as needed for muscle spasms. 540 tablet 1   venlafaxine XR (EFFEXOR-XR) 150 MG 24 hr capsule TAKE 1 CAPSULE BY MOUTH DAILY WITH BREAKFAST. 90 capsule 1   HYDROcodone-acetaminophen (NORCO) 7.5-325 MG tablet Take 1 tablet by mouth every 4 (four) hours as needed for up to 7 days for moderate pain ((score 4 to 6)). 40 tablet 0   busPIRone (BUSPAR) 7.5 MG tablet 1-2 TAB BY MOUTH 3 TIMES A DAY FOR ANXIETY (Patient not taking: Reported on 02/09/2021) 540 tablet 1   famotidine (PEPCID) 40 MG tablet TAKE 1 TABLET BY MOUTH EVERYDAY AT BEDTIME (Patient not taking: Reported on 02/09/2021) 90 tablet 1   nystatin cream (MYCOSTATIN) Apply 1 application topically 2 (two) times daily. (Patient not taking: Reported on 02/09/2021) 30 g 1   terbinafine (LAMISIL) 250 MG tablet Take 1 tablet (250 mg total) by mouth daily. (Patient not taking: Reported on 02/09/2021) 3 tablet 0   Facility-Administered Medications Prior to Visit  Medication Dose Route Frequency Provider Last Rate Last Admin   testosterone cypionate (DEPOTESTOSTERONE CYPIONATE) injection 200 mg  200 mg Intramuscular Q14 Days Saguier, Percell Miller, PA-C   200 mg at 01/14/21 1026    Review of Systems  Review of Systems  Constitutional:  Positive for fatigue.  HENT: Negative.    Respiratory:  Positive for wheezing.  Negative for cough, chest tightness and shortness of breath.        DOE  Cardiovascular: Negative.     Physical Exam  BP (!) 142/70 (BP Location: Left Arm, Patient Position: Sitting, Cuff Size: Normal)   Pulse 67   Temp 98.7 F (37.1 C) (Oral)   Ht _0  (1.702 m)   Wt 163 lb 9.6 oz (74.2 kg)   SpO2 99%   BMI 25.62 kg/m  Physical Exam Constitutional:      Appearance: Normal appearance.  HENT:     Head: Normocephalic and atraumatic.     Mouth/Throat:     Mouth: Mucous membranes are moist.     Pharynx: Oropharynx is clear.  Cardiovascular:     Rate and Rhythm: Normal rate and regular rhythm.  Pulmonary:     Effort: Pulmonary effort is normal.     Breath sounds: Normal breath sounds.  Musculoskeletal:        General: Normal range of motion.  Skin:    General: Skin is warm and dry.  Neurological:     General: No focal deficit present.     Mental Status: He is alert and oriented to person, place, and time. Mental status is at baseline.  Psychiatric:        Mood and Affect: Mood normal.        Behavior: Behavior normal.        Thought Content: Thought content normal.  Judgment: Judgment normal.     Lab Results:  CBC    Component Value Date/Time   WBC 8.9 01/07/2021 1351   RBC 3.79 (L) 01/07/2021 1351   HGB 11.8 (L) 01/07/2021 1351   HGB 12.8 (L) 11/18/2020 1608   HCT 35.4 (L) 01/07/2021 1351   PLT 450.0 (H) 01/07/2021 1351   MCV 93.3 01/07/2021 1351   MCH 29.0 12/04/2020 1523   MCHC 33.5 01/07/2021 1351   RDW 21.1 (H) 01/07/2021 1351   LYMPHSABS 0.6 (L) 01/07/2021 1351   MONOABS 0.9 01/07/2021 1351   EOSABS 0.0 01/07/2021 1351   BASOSABS 0.0 01/07/2021 1351    BMET    Component Value Date/Time   NA 138 02/01/2021 0835   K 4.4 02/01/2021 0835   CL 98 02/01/2021 0835   CO2 27 02/01/2021 0835   GLUCOSE 115 (H) 02/01/2021 0835   GLUCOSE 187 (H) 01/05/2021 1142   BUN 12 02/01/2021 0835   CREATININE 0.63 (L) 02/01/2021 0835   CREATININE 0.75  12/09/2020 1410   CALCIUM 9.5 02/01/2021 0835   GFRNONAA 84 12/04/2020 1523   GFRAA 98 12/04/2020 1523    BNP    Component Value Date/Time   BNP 68.6 11/10/2020 1654    ProBNP No results found for: PROBNP  Imaging: DG Chest 2 View  Result Date: 02/09/2021 CLINICAL DATA:  Follow-up pneumonitis EXAM: CHEST - 2 VIEW COMPARISON:  12/17/2020 FINDINGS: Heart size is normal. Mediastinal shadows are normal, with exception of previous spinal fusion hardware. There is some scattered areas of pulmonary scarring but patchy infiltrate previously seen on the right has resolved. No new or recurrent pneumonia. No effusion. No acute bone finding. IMPRESSION: Resolution of previously seen patchy infiltrates in the right lung. Mild scattered pulmonary scarring. Electronically Signed   By: Nelson Chimes M.D.   On: 02/09/2021 15:01     Assessment & Plan:   Drug-induced pneumonitis - He has been weaned of oral prednisone and supplemental oxygen - CXR today showed resolution of previous patchy infiltrate right lung with mild residual scarring   COPD (chronic obstructive pulmonary disease) ([place]) - Pulmonary function testing in May 2022 showed moderate obstructive airways disease/ moderate restriction-parenchymal without BD response. He experiences dyspnea with exertion and has some intermittent wheezing. Recommend trial Spiriva Respimat 2.52mg two puffs once daily. FU in 2 weeks to assess inhaler response.    EMartyn Ehrich NP 02/10/2021

## 2021-02-10 ENCOUNTER — Ambulatory Visit (INDEPENDENT_AMBULATORY_CARE_PROVIDER_SITE_OTHER): Payer: Medicare Other

## 2021-02-10 ENCOUNTER — Encounter: Payer: Self-pay | Admitting: Primary Care

## 2021-02-10 DIAGNOSIS — J441 Chronic obstructive pulmonary disease with (acute) exacerbation: Secondary | ICD-10-CM | POA: Insufficient documentation

## 2021-02-10 DIAGNOSIS — R7989 Other specified abnormal findings of blood chemistry: Secondary | ICD-10-CM

## 2021-02-10 DIAGNOSIS — J449 Chronic obstructive pulmonary disease, unspecified: Secondary | ICD-10-CM | POA: Insufficient documentation

## 2021-02-10 MED ORDER — TESTOSTERONE CYPIONATE 200 MG/ML IM SOLN
200.0000 mg | INTRAMUSCULAR | Status: DC
  Administered 2021-02-10: 200 mg via INTRAMUSCULAR

## 2021-02-10 NOTE — Assessment & Plan Note (Addendum)
-   He has been weaned of oral prednisone and supplemental oxygen - CXR today showed resolution of previous patchy infiltrate right lung with mild residual scarring

## 2021-02-10 NOTE — Progress Notes (Addendum)
Pt is here today for testosterone injection. Pt was given testosterone injection in right glute. Pt tolerated well  Mackie Pai, PA-C

## 2021-02-10 NOTE — Assessment & Plan Note (Signed)
-   Pulmonary function testing in May 2022 showed moderate obstructive airways disease/ moderate restriction-parenchymal without BD response. He experiences dyspnea with exertion and has some intermittent wheezing. Recommend trial Spiriva Respimat 2.84mg two puffs once daily. FU in 2 weeks to assess inhaler response.

## 2021-02-11 MED ORDER — RIVAROXABAN 20 MG PO TABS: 20.0000 mg | ORAL_TABLET | Freq: Every day | ORAL | 0 refills | Status: DC

## 2021-02-11 NOTE — Addendum Note (Signed)
Addended by: Resa Miner I on: 02/11/2021 09:19 AM   Modules accepted: Orders

## 2021-02-12 ENCOUNTER — Telehealth: Payer: Self-pay | Admitting: Primary Care

## 2021-02-12 ENCOUNTER — Encounter: Payer: Self-pay | Admitting: Specialist

## 2021-02-12 NOTE — Telephone Encounter (Signed)
Martyn Ehrich, NP  02/09/2021  3:10 PM EDT      I tried patient but no answer. Please let patient know CXR showed resolution ofpreviously seen infiltrates. Mild scarring.     Patient is aware of results and voiced his understanding.  Nothing further needed.

## 2021-02-16 ENCOUNTER — Encounter: Payer: Self-pay | Admitting: Medical

## 2021-02-17 DIAGNOSIS — M4628 Osteomyelitis of vertebra, sacral and sacrococcygeal region: Secondary | ICD-10-CM | POA: Diagnosis not present

## 2021-02-17 DIAGNOSIS — G894 Chronic pain syndrome: Secondary | ICD-10-CM | POA: Diagnosis not present

## 2021-02-17 DIAGNOSIS — M961 Postlaminectomy syndrome, not elsewhere classified: Secondary | ICD-10-CM | POA: Diagnosis not present

## 2021-02-17 DIAGNOSIS — M150 Primary generalized (osteo)arthritis: Secondary | ICD-10-CM | POA: Diagnosis not present

## 2021-02-18 ENCOUNTER — Telehealth: Payer: Self-pay | Admitting: Medical

## 2021-02-18 ENCOUNTER — Other Ambulatory Visit: Payer: Self-pay

## 2021-02-18 ENCOUNTER — Ambulatory Visit: Payer: Medicare Other | Admitting: Surgery

## 2021-02-18 DIAGNOSIS — Z9889 Other specified postprocedural states: Secondary | ICD-10-CM | POA: Diagnosis not present

## 2021-02-18 DIAGNOSIS — Z981 Arthrodesis status: Secondary | ICD-10-CM

## 2021-02-18 DIAGNOSIS — M546 Pain in thoracic spine: Secondary | ICD-10-CM

## 2021-02-18 MED ORDER — ESZOPICLONE 2 MG PO TABS: 2.0000 mg | ORAL_TABLET | Freq: Every evening | ORAL | 0 refills | Status: DC | PRN

## 2021-02-18 NOTE — Telephone Encounter (Signed)
Lunesta rx sent

## 2021-02-20 ENCOUNTER — Ambulatory Visit
Admission: RE | Admit: 2021-02-20 | Discharge: 2021-02-20 | Disposition: A | Discharge status: Home / Self Care | Payer: Medicare Other | Source: Ambulatory Visit | Attending: Surgery | Admitting: Surgery

## 2021-02-20 ENCOUNTER — Other Ambulatory Visit: Payer: Self-pay

## 2021-02-20 DIAGNOSIS — R079 Chest pain, unspecified: Secondary | ICD-10-CM | POA: Diagnosis not present

## 2021-02-20 DIAGNOSIS — M4624 Osteomyelitis of vertebra, thoracic region: Secondary | ICD-10-CM | POA: Diagnosis not present

## 2021-02-20 DIAGNOSIS — Z8619 Personal history of other infectious and parasitic diseases: Secondary | ICD-10-CM | POA: Diagnosis not present

## 2021-02-20 DIAGNOSIS — Z981 Arthrodesis status: Secondary | ICD-10-CM

## 2021-02-20 DIAGNOSIS — M4324 Fusion of spine, thoracic region: Secondary | ICD-10-CM | POA: Diagnosis not present

## 2021-02-20 MED ORDER — GADOBENATE DIMEGLUMINE 529 MG/ML IV SOLN
15.0000 mL | Freq: Once | INTRAVENOUS | Status: AC | PRN
  Administered 2021-02-20: 15 mL via INTRAVENOUS

## 2021-02-21 ENCOUNTER — Encounter: Payer: Self-pay | Admitting: Medical

## 2021-02-21 ENCOUNTER — Encounter: Payer: Self-pay | Admitting: Specialist

## 2021-02-23 ENCOUNTER — Ambulatory Visit (INDEPENDENT_AMBULATORY_CARE_PROVIDER_SITE_OTHER): Payer: Medicare Other | Admitting: Primary Care

## 2021-02-23 ENCOUNTER — Other Ambulatory Visit: Payer: Self-pay

## 2021-02-23 ENCOUNTER — Telehealth: Payer: Self-pay | Admitting: Medical

## 2021-02-23 ENCOUNTER — Encounter: Payer: Self-pay | Admitting: Primary Care

## 2021-02-23 DIAGNOSIS — J449 Chronic obstructive pulmonary disease, unspecified: Secondary | ICD-10-CM

## 2021-02-23 DIAGNOSIS — J189 Pneumonia, unspecified organism: Secondary | ICD-10-CM

## 2021-02-23 DIAGNOSIS — T50905A Adverse effect of unspecified drugs, medicaments and biological substances, initial encounter: Secondary | ICD-10-CM

## 2021-02-23 MED ORDER — STIOLTO RESPIMAT 2.5-2.5 MCG/ACT IN AERS: 2.0000 | INHALATION_SPRAY | Freq: Every day | RESPIRATORY_TRACT | 0 refills | Status: DC

## 2021-02-23 NOTE — Patient Instructions (Addendum)
Recommendations: - Stop Spiriva - Start Stiolto take 2 puff once daily in the morning  - [employer] and ask for formulary LABA or LABA/LAMA inhaler on your plan (examples include: Spiriva Handihaler, Incruse, Stiolto, Bevespi or Anoro) - Notify our office by [employer] regarding inhaler coverage and if you preferred Stiolto versus Spiriva   Follow-up: - 2-3 months with Dr. Chase Caller please

## 2021-02-23 NOTE — Assessment & Plan Note (Signed)
-   CXR in July 2022 showed resolution of previous patchy infiltrate right lung with mild residual scarring. He has been weaned off oral prednisone and supplemental oxygen

## 2021-02-23 NOTE — Assessment & Plan Note (Addendum)
-   PFTs in May 2022 showed moderate obstrucive airway disease without BD response. Wheezing resolved with sample of Spiriva Respimat 2.59mg but he is still having dyspnea symptoms. Recommend trial Stiolto Respimat 2 puffs once daily in morning. He will call his insurance to ask what LABA or LABA/LAMA inahler is formulary on his plan. FU in 2-3 months with Dr. RChase Caller

## 2021-02-23 NOTE — Progress Notes (Signed)
_0  ID: Luis Brown, male    DOB: 04-Sep-1945, 75 y.o.   MRN: [id number]  No chief complaint on file.   Referring provider: Elise Brown  HPI:  75 year old male, former smoker (20-pack-year history).  Past medical history significant hypertension, DVT, drug-induced pneumonitis, eosinophilic pneumonia, acute respiratory failure with hypoxia, GERD, gastric ulcer, alcohol abuse, protein calorie malnutrition, hyperlipidemia, anxiety/depression.  Patient was recently admitted to [facility] from 11/10/2020 - 11/23/2020 for drug induced pneumonitis.  Hospital course 11/10/2020 - 11/23/2020: Patient presented to the hospital shortness of breath.  He was originally seen at PCPs office and found to be hypoxic.  He was referred to emergency room.  He underwent work-up with CTA chest which showed large right sided pneumonia with groundglass nodules and concern for central cavitation.  He was started on IV antibiotics on admission.  Infectious disease was consulted given prolonged antibiotic course at home and now with atypical pneumonia.  Echocardiogram was negative for valvular vegetation.  MRI was negative for deep infection but revealed a subcutaneous probable seroma.  IR was consulted per ID for aspiration/drainage and culturing of fluid.  He underwent aspiration on 11/13/2020.  Fluid cultures obtained showing no growth.  Patient hospital course complicated by right upper extremity DVT secondary to PICC line.  Patient developed worsening respiratory symptoms and increasing oxygen requirements.  He underwent repeat CT chest which showed worsening right-sided pneumonia.  Pulmonary was consulted and he underwent bronchoscopy on 11/17/2020.  Bronchial washings were generally unremarkable.  There was discussion regarding possibly underlying daptomycin pneumonitis.  Daptomycin was discontinued when cultures patient was de-escalated to Bactrim per ID recommendations.  Patient will follow-up outpatient with  ID for ongoing antibiotic management.  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  Patient was discharged home on oxygen.  Will remain on steroids until follow-up with pulmonary on May 17.  He was discharged on Xarelto, given its provoked nature will likely be able to discontinue once imaging is negative in the next 3 to 6 months per protocol.  Patient was discharged on Bactrim and Xanax given profound anxiety in relation to dyspnea.  Patient is aware Xanax is not and should not be permanent fixture or addition to his home medication list and will need follow-up with PCP to manage prescriptions further.   12/01/2020 -Remains on prednisone 76m daily until follow-up and assess when to taper dose -Continues Bactim BID -FOB on 5/3 with negtive pathology, BAL negative -Follow up with Dr. RChase Callerin ILD clinic in June   Patient reports that he is a little better.  He continues to have some shortness of breath with occasional wheezing.  He has a productive cough which has improved.  Underlying anxiety due to dyspnea symptoms.  He has had no emergencies requiring his albuterol rescue inhaler.  He used this twice since discharge.  He is using flutter valve and incentive spirometer.  He is still on Bactrim and has follow-up with infectious disease in the next week.  Reports difficulty sleeping at night due to steroid.  He has Ambien 5 mg as needed insomnia.  He is using Xanax twice a day as needed for anxiety.  He will follow-up with primary care next Monday. His appetite has improved some. Drinking protein shakes.  Denies fever chills sweats, chest tightness or chest pain.   12/15/2020 Patient presents today for acute office visit. He had follow-up CT chest on 12/11/20 that showed small-moderate right pneumothorax, approx  15-20%. Previous right upper lobe consolidation has improved without residual consolidative components. There is  persistent ground glass attenuation and cystic changes throughout RUL and ground glass in RML and lower lobes. Findings may be post infectious or inflammatory versus sequale of interstitial pneumonia. On call provider was notified of critical results. Patient was called ON 12/13/20 and made aware of the results. 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 Patient did report some increase in shortness of breath over the last few days but denies chest pain. Patient opted to wait until Tuesday for follow-up CXR. If pneumothorax still present will need ED evaluation for possible chest tube placement. He has an apt in June with [facility].   He feels well today. No significant change noted to his breathing. Still able to do some chores indoors. He does not get out of breath at home. He is wearing 2L oxygen. He fell Saturday 5/28, states that he got tripped up with his dog. He hit his face on refrigerator. No LOC or change in mental status. He is on blood thinner. Neuros intact. Bruising to nose.   CXR today showed improvement apical pneumothorax that was seen on previous CT chest, possibly still present on lateral view. Had [facility] review imaging. Overall reassuring. Plan continue to monitor with repeat CXR on Thursday.   12/29/20- [facility] Chief Complaint  Patient presents with   Follow-up    PFT  performed 5/24.  Pt states since last visit, he has been doing okay. States he has lost some weight that he has noticed since last visit.    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 -returns for follow-up.  I personally saw him in the hospital on 11/20/2020.  At that point in time he had been started on Solu-Medrol on 11/18/2020.  When I saw him he was on day 3 of Solu-Medrol and he was needing 3 L nasal cannula oxygen at rest.  At that point in time even with 3 L he was desaturating to 86% just  talking.  Put him on prednisone 60 mg/day.  After that he see nurse practitioner x2 at least 1 of which was a telephone visit..  As part of his follow-up on 12/11/2020 he had a CT scan of the chest [3 days after pulmonary function test] and on my personal visualization it showed new onset pneumothorax but significant improvement in infiltrates and inflammation.  He had another follow-up chest x-ray 12/17/2020 in which no pneumothorax reported [similar on 12/15/2020] and pulmonary infiltrates appear improved.  He is now here with his wife  He tells me he is now on 10 mg/day prednisone he just darted that today.  He is 1 week left and after that the prednisone taper ends.  Because of the pneumothorax is physical therapy has been on hold but 4 days ago he was evaluated and apparently his pulse ox was 100% but this was on oxygen.  He continues to use his oxygen although he is not sure if he is desaturating anymore.  He does feel overall improved compared to 6 weeks ago but still has significant amount of fatigue.  He has significant insomnia which he believes is due to prednisone.  Today's last night was the first night he could sleep when he dropped his prednisone down to 10 mg/day.  He believes his fatigue is multifactorial related to the disease, multiple medications, lack of oxygen, insomnia etc.  02/09/2021 Patient presents today for 1 month follow-up. Since his last visit in June, he has established with cardiology and was dx with afib. He is currently on Xarelto and Digoxin. His wife states that he has had more energy recently. He is scheduled for testosterone injection tomorrow. He gets winded with stairs and heavy exertion. He reports intermittent wheezing. He needs refill of albuterol rescue inhaler. He has weaned himself of oxygen and O2 equipment has been picked up. He has been sleeping better at night. Anxiety has improved, he stopped xanax. Due for follow-up CXR today. Pulmonary function testing in May  2022 showed moderate obstructive airway disease without BD response.  02/23/2021- Interim hx  Patient presents today for 2 week follow-up. During his last visit he was given trial Spiriva Respimat d/t dyspnea /wheezing symptoms and moderate obstruction seen on his PFTs. He has been using sample of Spiriva Respimat 2.29mg as prescribed, he occasionally misses a dose for 1-2 days. He states that his wheezing has stopped but he has not noticed a significant difference in dyspnea symptoms. His ILD scale appears improved from two weeks go. He has having some muscle spasms in his back that started before being placed on LABA.    SYMPTOM SCALE - ILD 12/29/2020  02/09/2021  02/23/2021   O2 use 2-3L [place] [facility] RA RA  Shortness of Breath 0 -> 5 scale with 5 being worst (score 6 If unable to do)    At rest 0 0 1  Simple tasks - showers, clothes change, eating, shaving 1 0 0  Household (dishes, doing bed, laundry) 1 3 0  Shopping Not yeet 0 0  Walking level at own pace 2 0 0  Walking up Stairs _0 Total (30-36) Dyspnea Score _1 How bad is your cough? _2 How bad is your fatigue 0 2 1  How bad is nausea 0 0 0  How bad is vomiting?  0 0 0  How bad is diarrhea? 0 0 0  How bad is anxiety? 3 1 0  How bad is depression 1 0 0    Walking desaturation test on room air with a forehead probe today  Simple office walk 185 feet x  3 laps goal with forehead probe 12/29/2020  02/09/2021   O2 used ra RA  Number laps completed 3 3  Comments about pace steady Average pace   Resting Pulse Ox/HR 97% and 120/min 97% and 97/min  Final Pulse Ox/HR 98% and 137/min 93% and 110/min  Desaturated </= 88% no no  Desaturated <= 3% points no yes  Got Tachycardic >/= 90/min yes yes  Symptoms at end of test Dyspneic atn2nd lap SOB on 3rd lap   Miscellaneous comments x x     Imaging:  11/23/20 CXR - Persistent right pulmonary opacities and right pleural effusion.Aeration improved compared to 11/17/2020.  12/11/20-  CT CHEST WO CONTRAST IMPRESSION: 1. Small to moderate right pneumothorax, approximately 15-20%. This was not present on prior chest imaging, but is not significantly changed from recent thoracic spine radiograph 12/03/2020. 2. Previous right upper lobe consolidation has improved, without residual consolidative components. There is persistent ground-glass attenuation and cystic changes throughout the right upper lobe, with geographic pulmonary 10 UA shin and ground-glass in the right middle and lower lobes. There is mild associated right lung bronchiectasis. Findings may be due to post infectious or inflammatory sequela, versus sequela of interstitial pneumonia. 3. Improved mediastinal adenopathy from prior exam.  4. Faint ground-glass attenuation in the lingula and anterior left upper lobe was present on prior and mildly improved, likely infectious or inflammatory. 5. Stable pulmonary nodules dating back to June 2021 exam    Allergies  Allergen Reactions   Daptomycin     Drug induced pneumonia     Immunization History  Administered Date(s) Administered   Fluad Quad(high Dose 65+) 04/05/2019, 06/29/2020   Influenza Split 04/23/2012   Influenza Whole 02/16/2011   Influenza, High Dose Seasonal PF 05/06/2013, 03/25/2016, 05/05/2017, 06/07/2018   PFIZER(Purple Top)SARS-COV-2 Vaccination 08/23/2019, 09/13/2019, 06/07/2020   Pneumococcal Conjugate-13 03/25/2016   Pneumococcal Polysaccharide-23 01/20/2009, 10/19/2011   Td 07/09/2003, 07/18/2008   Tdap 07/20/2011   Zoster, Live 04/27/2016    Past Medical History:  Diagnosis Date   Acute pharyngitis 10/21/2013   Allergy    grass and pollen   Anxiety and depression 10/25/2011   BPH (benign prostatic hyperplasia) 04/23/2012   Chicken pox as a child   DDD (degenerative disc disease)    cervical responds to steroid injections and low back required surgery   DDD (degenerative disc disease), lumbosacral    Diabetes mellitus    pre    Dyspnea    ED (erectile dysfunction) 04/23/2012   Elevated BP    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   Esophageal reflux 02/10/2015   Fatigue    HTN (hypertension)    Hyperglycemia    preDM    Hyperlipidemia    Insomnia    Low back pain radiating to both legs 01/16/2017   Measles as a child   Overweight(278.02)    Peripheral neuropathy 08/07/2019   Personal history of colonic polyps 10/27/2012   Follows with [facility]   Pneumonia    " walking"   Preventative health care    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   Wears glasses     Tobacco History: Social History   Tobacco Use  Smoking Status Former   Packs/day: 1.00   Years: 20.00   Pack years: 20.00   Types: Cigarettes   Quit date: 07/18/1989   Years since quitting: 31.6  Smokeless Tobacco Never   Counseling given: Not Answered   Outpatient Medications Prior to Visit  Medication Sig Dispense Refill   atorvastatin (LIPITOR) 10 MG tablet TAKE 1 TABLET BY MOUTH EVERY DAY 30 tablet 0   b complex vitamins capsule Take 1 capsule by mouth daily.     cetirizine (ZYRTEC) 10 MG tablet Take 10 mg by mouth daily. In the morning     digoxin (LANOXIN) 0.125 MG tablet Take 1 tablet (0.125 mg total) by mouth daily. 90 tablet 3   eszopiclone (LUNESTA) 2 MG TABS tablet Take 1 tablet (2 mg total) by mouth at bedtime as needed for sleep. Take immediately before bedtime 14 tablet 0   ferrous fumarate (FERRETTS) 325 (106 Fe) MG TABS tablet Take 1 tablet (106 mg of iron total) by mouth daily. 90 tablet 3   glucose blood (CONTOUR NEXT TEST) test strip Check blood sugar once daily 100 each 12   HYDROcodone-acetaminophen (NORCO) 7.5-325 MG tablet Take 1 tablet by mouth every 4 (four) hours as needed for up to 7 days for moderate pain ((score 4 to 6)). 40 tablet 0   JANUVIA 50 MG tablet TAKE 1 TABLET BY MOUTH EVERY DAY 30 tablet 3   metFORMIN (GLUCOPHAGE) 1000 MG tablet Take 1 tablet (1,000 mg total) by mouth 2 (two) times daily with a meal. 180  tablet 0   pantoprazole (PROTONIX) 40 MG tablet  TAKE 1 TABLET BY MOUTH TWICE A DAY BEFORE A MEAL (Patient taking differently: 40 mg 2 (two) times daily.) 180 tablet 1   pregabalin (LYRICA) 75 MG capsule TAKE 1 CAPSULE BY MOUTH TWICE A DAY (Patient taking differently: Take 75 mg by mouth 2 (two) times daily.) 60 capsule 3   PROVENTIL HFA 108 (90 Base) MCG/ACT inhaler TAKE 2 PUFFS BY MOUTH EVERY 6 HOURS AS NEEDED FOR WHEEZE OR SHORTNESS OF BREATH (Patient taking differently: Inhale 2 puffs into the lungs every 6 (six) hours as needed for wheezing or shortness of breath.) 6.7 each 5   rivaroxaban (XARELTO) 20 MG TABS tablet Take 1 tablet (20 mg total) by mouth daily with supper. 30 tablet 0   sodium chloride (OCEAN) 0.65 % SOLN nasal spray Place 1 spray into both nostrils as needed for congestion. 20 mL 0   sucralfate (CARAFATE) 1 g tablet TAKE 1 TABLET (1 G TOTAL) BY MOUTH 4 (FOUR) TIMES DAILY - WITH MEALS AND AT BEDTIME. 120 tablet 1   tamsulosin (FLOMAX) 0.4 MG CAPS capsule Take 2 capsules (0.8 mg total) by mouth daily. 180 capsule 3   testosterone cypionate (DEPOTESTOSTERONE CYPIONATE) 200 MG/ML injection INJECT 1 ML (200 MG TOTAL) INTO THE MUSCLE EVERY 14 (FOURTEEN) DAYS. 2 mL 0   Tiotropium Bromide Monohydrate (SPIRIVA RESPIMAT) 2.5 MCG/ACT AERS Inhale 2 puffs into the lungs daily. 4 g 0   tiZANidine (ZANAFLEX) 4 MG tablet Take 1.5 tablets (6 mg total) by mouth every 6 (six) hours as needed for muscle spasms. 540 tablet 1   venlafaxine XR (EFFEXOR-XR) 150 MG 24 hr capsule TAKE 1 CAPSULE BY MOUTH DAILY WITH BREAKFAST. 90 capsule 1   Facility-Administered Medications Prior to Visit  Medication Dose Route Frequency Provider Last Rate Last Admin   testosterone cypionate (DEPOTESTOSTERONE CYPIONATE) injection 200 mg  200 mg Intramuscular Q14 Days Saguier, Percell Miller, PA-C   200 mg at 01/14/21 1026   testosterone cypionate (DEPOTESTOSTERONE CYPIONATE) injection 200 mg  200 mg Intramuscular Q14 Days Saguier,  Percell Miller, PA-C   200 mg at 02/10/21 1004      Review of Systems  Review of Systems   Physical Exam  There were no vitals taken for this visit. Physical Exam   Lab Results:  CBC    Component Value Date/Time   WBC 8.9 01/07/2021 1351   RBC 3.79 (L) 01/07/2021 1351   HGB 11.8 (L) 01/07/2021 1351   HGB 12.8 (L) 11/18/2020 1608   HCT 35.4 (L) 01/07/2021 1351   PLT 450.0 (H) 01/07/2021 1351   MCV 93.3 01/07/2021 1351   MCH 29.0 12/04/2020 1523   MCHC 33.5 01/07/2021 1351   RDW 21.1 (H) 01/07/2021 1351   LYMPHSABS 0.6 (L) 01/07/2021 1351   MONOABS 0.9 01/07/2021 1351   EOSABS 0.0 01/07/2021 1351   BASOSABS 0.0 01/07/2021 1351    BMET    Component Value Date/Time   NA 138 02/01/2021 0835   K 4.4 02/01/2021 0835   CL 98 02/01/2021 0835   CO2 27 02/01/2021 0835   GLUCOSE 115 (H) 02/01/2021 0835   GLUCOSE 187 (H) 01/05/2021 1142   BUN 12 02/01/2021 0835   CREATININE 0.63 (L) 02/01/2021 0835   CREATININE 0.75 12/09/2020 1410   CALCIUM 9.5 02/01/2021 0835   GFRNONAA 84 12/04/2020 1523   GFRAA 98 12/04/2020 1523    BNP    Component Value Date/Time   BNP 68.6 11/10/2020 1654    ProBNP No results found for: PROBNP  Imaging: DG Chest  2 View  Result Date: 02/09/2021 CLINICAL DATA:  Follow-up pneumonitis EXAM: CHEST - 2 VIEW COMPARISON:  12/17/2020 FINDINGS: Heart size is normal. Mediastinal shadows are normal, with exception of previous spinal fusion hardware. There is some scattered areas of pulmonary scarring but patchy infiltrate previously seen on the right has resolved. No new or recurrent pneumonia. No effusion. No acute bone finding. IMPRESSION: Resolution of previously seen patchy infiltrates in the right lung. Mild scattered pulmonary scarring. Electronically Signed   By: Nelson Chimes M.D.   On: 02/09/2021 15:01   MR THORACIC SPINE W WO CONTRAST  Result Date: 02/20/2021 CLINICAL DATA:  Spinal fusion, thoracic, follow up. Intervertebral disc disorder. Worsening  thoracic pain. hx infection. EXAM: MRI THORACIC WITHOUT AND WITH CONTRAST TECHNIQUE: Multiplanar and multiecho pulse sequences of the thoracic spine were obtained without and with intravenous contrast. CONTRAST:  62m MULTIHANCE GADOBENATE DIMEGLUMINE 529 MG/ML IV SOLN COMPARISON:  11/11/2020 FINDINGS: Alignment:  Unchanged trace anterolisthesis of C7 on T1. Vertebrae: Sequelae of extensive posterior spinal fusion are again identified extending from T5 into the lumbar spine with previous removal of pedicle screws at T5 and T6. Sequelae of T4-5 discitis-osteomyelitis are again identified with unchanged complete disc collapse and no progressive endplate erosion. Residual marrow edema and enhancement in the T4 and T5 vertebrae has decreased. Marrow edema and enhancement in the T6 and T7 vertebral bodies has increased without associated interval endplate erosion or disc collapse. Sequelae of treated T9-10 discitis-osteomyelitis are again noted with fusion across the disc space and no evidence of significant ongoing inflammation. Cord: No definite cord signal abnormality identified within limitations of magnetic susceptibility artifact. Paraspinal and other soft tissues: Persistent paravertebral soft tissue inflammation extending from T4 into the lower thoracic spine without a drainable fluid collection identified. Disc levels: Abnormal, largely circumferential enhancing epidural material extending from T4-T9 has increased with increased effacement of the thecal sac, particularly at T6-7. No rim enhancing epidural fluid collection is identified. IMPRESSION: 1. Increased epidural enhancement from T4-T9 which may reflect infectious phlegmon with increased effacement of the thecal sac and persistent paravertebral soft tissue inflammation. No drainable fluid collection identified. 2. Increased edema and enhancement in the T6 and T7 vertebral bodies suspicious for osteomyelitis although there is no interval endplate erosion  or disc collapse to indicate associated discitis. 3. Mildly improved appearance of T4-5 discitis-osteomyelitis. 4. 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 Electronically Signed   By: ALogan BoresM.D.   On: 02/20/2021 17:27   LONG TERM MONITOR (3-14 DAYS)  Result Date: 02/16/2021 Patch Wear Time:  12 days and 20 hours January 20, 2021 Indication: Paroxysmal atrial fibrillation Patient had a min HR of 47 bpm, max HR of 227 bpm, and avg HR of 77 bpm. Predominant underlying rhythm was Sinus Rhythm. Atrial Fibrillation/Flutter occurred (3% burden), ranging from 72-227 bpm (avg of 143 bpm), the longest lasting 1 hour 10 mins with an average rate of 134 bpm. Premature atrial complexes  were occasional (1.1%, 15374). Premature ventricular complexes were  rare (<1.0%). Symptoms were associated with atrial fibrillation. No AV block, no ventricular tachycardia noted. Conclusion: This study is remarkable for symptomatic paroxysmal atrial fibrillation and occasional premature atrial complexes.    Assessment & Plan:   No problem-specific Assessment & Plan notes found for this encounter.     EMartyn Ehrich NP 02/23/2021

## 2021-02-23 NOTE — Telephone Encounter (Signed)
Opened to review 

## 2021-02-23 NOTE — Telephone Encounter (Signed)
Opened to review  Adria Costley, PA-C  

## 2021-02-24 ENCOUNTER — Telehealth: Payer: Self-pay | Admitting: *Deleted

## 2021-02-24 ENCOUNTER — Telehealth: Payer: Self-pay | Admitting: Medical

## 2021-02-24 MED ORDER — VALACYCLOVIR HCL 1 G PO TABS: 1000.0000 mg | ORAL_TABLET | Freq: Two times a day (BID) | ORAL | 0 refills | Status: DC

## 2021-02-24 MED ORDER — RIVAROXABAN 20 MG PO TABS: 20.0000 mg | ORAL_TABLET | Freq: Every day | ORAL | 3 refills | Status: DC

## 2021-02-24 NOTE — Telephone Encounter (Signed)
Last refill in 2019 by Dr.Blyth

## 2021-02-24 NOTE — Telephone Encounter (Signed)
Rx refill sent to pharmacy. 

## 2021-02-24 NOTE — Telephone Encounter (Signed)
Rx valtex sent.

## 2021-02-24 NOTE — Telephone Encounter (Signed)
Rx valtrex sent to pt pharmacy.

## 2021-02-25 ENCOUNTER — Other Ambulatory Visit: Payer: Self-pay

## 2021-02-25 ENCOUNTER — Encounter: Payer: Self-pay | Admitting: Specialist

## 2021-02-25 ENCOUNTER — Ambulatory Visit: Payer: Medicare Other | Admitting: Specialist

## 2021-02-25 ENCOUNTER — Other Ambulatory Visit: Payer: Self-pay | Admitting: Internal Medicine

## 2021-02-25 VITALS — BP 84/57 | HR 77 | Ht 67.0 in | Wt 164.4 lb

## 2021-02-25 DIAGNOSIS — M4644 Discitis, unspecified, thoracic region: Secondary | ICD-10-CM | POA: Diagnosis not present

## 2021-02-25 DIAGNOSIS — R531 Weakness: Secondary | ICD-10-CM | POA: Diagnosis not present

## 2021-02-25 DIAGNOSIS — M960 Pseudarthrosis after fusion or arthrodesis: Secondary | ICD-10-CM

## 2021-02-25 DIAGNOSIS — M4325 Fusion of spine, thoracolumbar region: Secondary | ICD-10-CM | POA: Diagnosis not present

## 2021-02-25 DIAGNOSIS — M546 Pain in thoracic spine: Secondary | ICD-10-CM

## 2021-02-25 DIAGNOSIS — M4620 Osteomyelitis of vertebra, site unspecified: Secondary | ICD-10-CM

## 2021-02-25 NOTE — Addendum Note (Signed)
Addended by: [name] on: 02/25/2021 04:07 PM   Modules accepted: Orders

## 2021-02-25 NOTE — Patient Instructions (Signed)
Avoid frequent bending and stooping  No lifting greater than 10 lbs. May use ice or moist heat for pain. Weight loss is of benefit. Best medication for lumbar disc disease is arthritis medications like motrin, celebrex and naprosyn. Exercise is important to improve your indurance and does allow people to function better inspite of back pain. Stop tizanidine.  Start baclofen Will start use of a Jewitt brace to decrease stress over the Thoracolumbar junction.

## 2021-02-26 ENCOUNTER — Encounter: Payer: Self-pay | Admitting: Specialist

## 2021-02-26 ENCOUNTER — Telehealth (HOSPITAL_COMMUNITY): Payer: Self-pay

## 2021-02-26 ENCOUNTER — Other Ambulatory Visit: Payer: Self-pay | Admitting: Medical

## 2021-02-26 ENCOUNTER — Other Ambulatory Visit (HOSPITAL_COMMUNITY): Payer: Self-pay

## 2021-02-26 ENCOUNTER — Ambulatory Visit (INDEPENDENT_AMBULATORY_CARE_PROVIDER_SITE_OTHER): Payer: Medicare Other

## 2021-02-26 DIAGNOSIS — R7989 Other specified abnormal findings of blood chemistry: Secondary | ICD-10-CM

## 2021-02-26 LAB — C-REACTIVE PROTEIN: CRP: 115.2 mg/L — ABNORMAL HIGH (ref <8.0)

## 2021-02-26 LAB — SEDIMENTATION RATE: Sed Rate: 33 mm/h — ABNORMAL HIGH (ref 0–20)

## 2021-02-26 MED ORDER — TESTOSTERONE CYPIONATE 200 MG/ML IM SOLN
200.0000 mg | INTRAMUSCULAR | Status: DC
  Administered 2021-02-26: 200 mg via INTRAMUSCULAR

## 2021-02-26 MED ORDER — BACLOFEN 10 MG PO TABS: 10.0000 mg | ORAL_TABLET | Freq: Three times a day (TID) | ORAL | 0 refills | Status: DC

## 2021-02-26 NOTE — Telephone Encounter (Signed)
Called and spoke with pt in regards to PR, pt stated he is having back problems at this time and is not able to participate in PR.   Closed referral

## 2021-02-26 NOTE — Progress Notes (Addendum)
Luis Brown is a 75 y.o. male presents to the office today for testosterone injections, per physician's orders.  Testosterone Cypionate (med), '200mg'$ /mL (dose),  IM (route) was administered right upper outter quadrant (location) today. Patient tolerated injection.  Patient next injection due: 16 days from now, appt made Yes  Johnney Killian, PA-C

## 2021-02-26 NOTE — Addendum Note (Signed)
Addended by: [name] on: 02/26/2021 02:07 PM   Modules accepted: Orders

## 2021-03-01 NOTE — Telephone Encounter (Signed)
Patient returning call, states he is in pain and ready to sort out the antibiotics. He accepts appointment with Dr. Linus Salmons 8/17 at 4pm.   Beryle Flock, RN

## 2021-03-02 ENCOUNTER — Other Ambulatory Visit (HOSPITAL_COMMUNITY): Payer: Self-pay

## 2021-03-02 ENCOUNTER — Telehealth: Payer: Self-pay | Admitting: Pharmacist

## 2021-03-02 ENCOUNTER — Telehealth: Payer: Self-pay

## 2021-03-02 ENCOUNTER — Ambulatory Visit: Payer: Medicare Other | Admitting: Internal Medicine

## 2021-03-02 ENCOUNTER — Encounter: Payer: Self-pay | Admitting: Internal Medicine

## 2021-03-02 ENCOUNTER — Other Ambulatory Visit: Payer: Self-pay

## 2021-03-02 VITALS — BP 116/72 | HR 79 | Temp 97.6°F | Wt 165.0 lb

## 2021-03-02 DIAGNOSIS — M4624 Osteomyelitis of vertebra, thoracic region: Secondary | ICD-10-CM | POA: Diagnosis not present

## 2021-03-02 DIAGNOSIS — Z5181 Encounter for therapeutic drug level monitoring: Secondary | ICD-10-CM

## 2021-03-02 MED ORDER — TEDIZOLID PHOSPHATE 200 MG PO TABS: 1.0000 | ORAL_TABLET | Freq: Every day | ORAL | 1 refills | Status: DC

## 2021-03-02 NOTE — Telephone Encounter (Signed)
[facility] Patient Advocate Encounter   Received notification from [facility] that prior authorization for SIVEXTRO is required.   PA submitted on 03/02/21 Key [id number] Status is pending    [facility] will continue to follow.   [name], [facility] Patient [facility] Phone: [phone] Fax:  [phone]

## 2021-03-02 NOTE — Progress Notes (Signed)
   Subjective:    [name], male    DOB: [birth year], 75 y.o.   MRN: [id number]  HPI He is here for a work in visit. He has a history of T8-L1 fusion in 2015 and in 2018 was seen by Dr. Louanne Skye when imaging showed a rod breakage and loosening of the pedicle screws and underwnet replacement of the L5-S1 fusion with bone grafting.  He had rods replaced in 2020 and culture grew P acnes and placed on ceftriaxone then transition to long term doxycycline.  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 Culture then grew out Staph epidermidis and he was treated with daptomycin but developed eosinophilic foliculitis and then was being observed off of antibiotics.  He comes in now though with significantly worsening pain and MRI done which is concerning for infection again and his CRP is significantly elevated.     Review of Systems  Constitutional:  Negative for chills, fatigue and fever.  Gastrointestinal:  Negative for diarrhea and nausea.  Skin:  Negative for rash.      Objective:   Physical Exam Eyes:     General: No scleral icterus. Pulmonary:     Effort: Pulmonary effort is normal.  Musculoskeletal:     Comments: No spinous tenderness along thoracic spine  Skin:    Findings: No rash.  Neurological:     General: No focal deficit present.     Mental Status: He is alert.  Psychiatric:        Mood and Affect: Mood normal.   SH: no tobacco       Assessment & Plan:

## 2021-03-02 NOTE — Progress Notes (Signed)
Office Visit Note   Patient: Luis Brown           Date of Birth: [birth year]           MRN: [id number] Visit Date: 02/18/2021              Requested by: Mackie Pai, PA-C [address],  [name] PCP: Mackie Pai, PA-C   Assessment & Plan: Visit Diagnoses:  1. Status post hardware removal   2. Pain in thoracic spine   3. Arthrodesis status     Plan: With patient's increased pain and history I will get thoracic MRI with and without contrast.  Patient will follow-up with Dr. Louanne Skye in a few weeks to discuss results and treatment options.  All questions answered.  Follow-Up Instructions: Return in about 3 weeks (around 03/11/2021) for with dr Louanne Skye to review thoracic mri.   Orders:  Orders Placed This Encounter  Procedures   MR THORACIC SPINE W WO CONTRAST   No orders of the defined types were placed in this encounter.     Procedures: No procedures performed   Clinical Data: No additional findings.   Subjective: Chief Complaint  Patient presents with   Middle Back - Follow-up, Pain    HPI 75 year old white male comes in with complaints of worsening back pain.  History is documented he has multilevel thoracic and lumbar fusions that was complicated by infection.  States that since his last appointment with Dr. Louanne Skye January 29, 2021 he has had worsening back pain.  Pain with sitting and ambulating.  Denies loss of bowel or bladder function. Review of Systems No current cardiac pulmonary GI GU issues  Objective: Vital Signs: There were no vitals taken for this visit.  Physical Exam HENT:     Head: Normocephalic.  Pulmonary:     Effort: No respiratory distress.  Musculoskeletal:     Comments: Gait is somewhat antalgic.  He does have mild thoracolumbar tenderness/spasm.  Neurological:     Mental Status: He is alert and oriented to person, place, and time.    Ortho Exam  Specialty Comments:  No specialty comments  available.  Imaging: No results found.   PMFS History: Patient Active Problem List   Diagnosis Date Noted   COPD (chronic obstructive pulmonary disease) (Luis Brown) 02/10/2021   Palpitations 02/01/2021   Wears glasses 01/11/2021   Pneumothorax, right 12/15/2020   Anxiety 12/01/2020   SOB (shortness of breath)    DVT (deep venous thrombosis) (Luis Brown) 11/16/2020   Acute respiratory failure with hypoxia (Luis Brown) 11/14/2020   Fluid collection at surgical site    Wound infection    Sepsis ([place]) 11/11/2020   Drug-induced pneumonitis 11/10/2020   S/P bronchoscopy 09/14/2020   Hyperglycemia 03/22/2020   Gastric ulcer 01/27/2020   H/O colonoscopy with polypectomy 01/27/2020   Protein-calorie malnutrition, severe 01/09/2020   Weight loss 01/07/2020   Abdominal pain 12/30/2019   Nausea and vomiting 12/30/2019   Weight loss, abnormal 12/30/2019   Change in bowel habits 12/30/2019   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   Medication monitoring encounter 08/26/2019   Peripheral neuropathy 08/07/2019   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Luis Brown) 04/04/2019   Fusion of spine of thoracolumbar region 03/12/2019   Status post thoracic spinal fusion 02/12/2019   Thoracic spinal stenosis 04/17/2017   Painful orthopaedic hardware (Luis Brown) 04/17/2017   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 (Luis Brown) 04/17/2017   Low back pain radiating  to both legs 01/16/2017   Esophageal reflux 02/10/2015   Essential hypertension 06/25/2014   Asthma 06/25/2014   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   Overweight 06/25/2014   Other abnormal glucose 06/25/2014   Spinal stenosis of lumbar region without neurogenic claudication 04/09/2014   Flatback syndrome 04/09/2014   Chicken pox 10/21/2013   History of colonic polyps 10/27/2012   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   Benign enlargement of prostate 04/23/2012   ED (erectile  dysfunction) 04/23/2012   Allergic state 10/25/2011   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   Diabetes mellitus without complication (HCC)    Insomnia    Fatigue    Preventative health care    DDD (degenerative disc disease), lumbosacral    Thoracic degenerative disc disease    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   Esophageal reflux 02/10/2015   Fatigue    HTN (hypertension)    Hyperglycemia    preDM    Hyperlipidemia    Insomnia    Low back pain radiating to both legs 01/16/2017   Measles as a child   Overweight(278.02)    Peripheral neuropathy 08/07/2019   Personal history of colonic polyps 10/27/2012   [facility]   Pneumonia    " walking"   Preventative health care    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   Wears glasses     Family History  Problem Relation Age of Onset   Hypertension Mother    Diabetes Mother        type 2   Cancer Mother 22       breast in remission   Emphysema Father        smoker   COPD Father        smoker   Stroke Father 49       mini   Heart disease Father    Hypertension Sister    Hyperlipidemia Sister    Scoliosis Sister    Osteoporosis Sister    Hypertension Maternal Grandmother    Scoliosis Maternal Grandmother    Heart disease Maternal Grandfather    Heart disease Paternal Grandfather        smoker   Heart disease Daughter     Past Surgical History:  Procedure Laterality Date   BACK SURGERY  2012 and 1994   Dr Margret Chance, screws and cage in low back   BIOPSY  01/09/2020    Procedure: BIOPSY;  Surgeon: Otis Brace, MD;  Location: [facility];  Service: Gastroenterology;;   BRONCHIAL BIOPSY  11/17/2020   Procedure: BRONCHIAL BIOPSIES;  Surgeon: Luis Coder, MD;  Location: [facility];  Service: Endoscopy;;   BRONCHIAL BRUSHINGS  11/17/2020   Procedure: BRONCHIAL BRUSHINGS;  Surgeon: Luis Coder, MD;  Location: [facility];  Service: Endoscopy;;   BRONCHIAL WASHINGS  11/17/2020   Procedure: BRONCHIAL WASHINGS;  Surgeon: Luis Coder, MD;  Location: [facility];  Service: Endoscopy;;   COLONOSCOPY WITH PROPOFOL N/A 01/09/2020   Procedure:  COLONOSCOPY WITH PROPOFOL;  Surgeon: Otis Brace, MD;  Location: [facility];  Service: Gastroenterology;  Laterality: N/A;   ESOPHAGOGASTRODUODENOSCOPY (EGD) WITH PROPOFOL N/A 01/09/2020   Procedure: ESOPHAGOGASTRODUODENOSCOPY (EGD) WITH PROPOFOL;  Surgeon: Otis Brace, MD;  Location: [facility];  Service: Gastroenterology;  Laterality: N/A;   EYE SURGERY Bilateral    2016   HARDWARE REVISION  03/12/2019   REVISION OF HARDWARE THORACIC TEN-THORACIC ELEVEN WITH APPLICATION OF ADDITIONAL RODS AND ROD SLEEVES THORACIC EIGHT-THORACIC TWELVE 12-LUMBAR ONE (N/A )   HEMOSTASIS CONTROL  11/17/2020   Procedure: HEMOSTASIS CONTROL;  Surgeon: Luis Coder, MD;  Location: [facility];  Service: Endoscopy;;   IR US GUIDE BX ASP/DRAIN  11/13/2020   LAMINECTOMY  02/12/2019   DECOMPRESSIVE LAMINECTOMY THORACIC NINE-THORACIC TEN AND THORACIC TEN-THORACIC ELEVEN, EXTENSION OF THORACOLUMBAR FUSION FROM THORACIC TEN TO THORACIC FIVE, LOCAL BONE GRAFT, ALLOGRAFT AND VIVIGEN (N/A)   POLYPECTOMY  01/09/2020   Procedure: POLYPECTOMY;  Surgeon: Otis Brace, MD;  Location: [facility];  Service: Gastroenterology;;   REMOVAL OF RODS AND PEDICLE SCREWS T5 AND T6, EXPLORATION OF FUSION, BIOPSY TRANSPEDICULAR T5 AND T6 (N/A )  09/14/2020   SPINAL FUSION N/A 02/12/2019   Procedure: DECOMPRESSIVE LAMINECTOMY  THORACIC NINE-THORACIC TEN AND THORACIC TEN-THORACIC ELEVEN, EXTENSION OF THORACOLUMBAR FUSION FROM THORACIC TEN TO THORACIC FIVE, LOCAL BONE GRAFT, ALLOGRAFT AND VIVIGEN;  Surgeon: Jessy Oto, MD;  Location: [facility];  Service: Orthopedics;  Laterality: N/A;  DECOMPRESSIVE LAMINECTOMY THORACIC NINE-THORACIC TEN AND THORACIC TEN-THORACIC ELEVEN, EXTENSION OF TH   SPINAL FUSION N/A 03/12/2019   Procedure: REVISION OF HARDWARE THORACIC TEN-THORACIC ELEVEN WITH APPLICATION OF ADDITIONAL RODS AND ROD SLEEVES THORACIC EIGHT-THORACIC TWELVE 12-LUMBAR ONE;  Surgeon: Jessy Oto, MD;  Location: [facility];  Service: Orthopedics;  Laterality: N/A;   TONSILLECTOMY     as child   torn rotator cuff  2010   right   VIDEO BRONCHOSCOPY N/A 11/17/2020   Procedure: VIDEO BRONCHOSCOPY WITH FLUORO;  Surgeon: Luis Coder, MD;  Location: [facility];  Service: Endoscopy;  Laterality: N/A;   Social History   Occupational History   Not on file  Tobacco Use   Smoking status: Former    Packs/day: 1.00    Years: 20.00    Pack years: 20.00    Types: Cigarettes    Quit date: 07/18/1989    Years since quitting: 31.6   Smokeless tobacco: Never  Vaping Use   Vaping Use: Never used  Substance and Sexual Activity   Alcohol use: Not Currently   Drug use: No   Sexual activity: Yes    Comment: lives with wife, still working, no dietary restrictions, continues to exercise intermittently

## 2021-03-02 NOTE — Assessment & Plan Note (Signed)
Starting tedizolid and will monitor cbc and cmp on treatment.

## 2021-03-02 NOTE — Assessment & Plan Note (Signed)
Worsening infection likely based on the MRI findings and inflammatory markers which were reviewed with the patient and his wife.  I will have him start tedizolid once approved via [place] which the pharmacy team is working on.  Will have him then follow up in 2 weeks to check labs including CBC and inflammatory markers to see if he is improving clinically.

## 2021-03-02 NOTE — Telephone Encounter (Signed)
Completed appeal letter for patient's tedizolid that was initially denied. Faxed to [facility] [employer]. Asked for expedited review.

## 2021-03-03 ENCOUNTER — Encounter: Payer: Self-pay | Admitting: Medical

## 2021-03-04 ENCOUNTER — Telehealth: Payer: Self-pay | Admitting: Medical

## 2021-03-04 ENCOUNTER — Encounter: Payer: Self-pay | Admitting: Specialist

## 2021-03-04 ENCOUNTER — Other Ambulatory Visit (HOSPITAL_COMMUNITY): Payer: Self-pay

## 2021-03-04 MED ORDER — ZOLPIDEM TARTRATE 5 MG PO TABS: 5.0000 mg | ORAL_TABLET | Freq: Every evening | ORAL | 0 refills | Status: DC | PRN

## 2021-03-04 NOTE — Telephone Encounter (Signed)
Rx ambien  sent to pt pharmacy.

## 2021-03-05 ENCOUNTER — Encounter: Payer: Self-pay | Admitting: Cardiology

## 2021-03-05 ENCOUNTER — Ambulatory Visit: Payer: Medicare Other | Admitting: Cardiology

## 2021-03-05 ENCOUNTER — Other Ambulatory Visit (HOSPITAL_COMMUNITY): Payer: Self-pay

## 2021-03-05 ENCOUNTER — Other Ambulatory Visit: Payer: Self-pay | Admitting: Pharmacist

## 2021-03-05 ENCOUNTER — Other Ambulatory Visit: Payer: Self-pay

## 2021-03-05 VITALS — BP 126/76 | HR 68 | Ht 67.0 in | Wt 165.0 lb

## 2021-03-05 DIAGNOSIS — I480 Paroxysmal atrial fibrillation: Secondary | ICD-10-CM | POA: Diagnosis not present

## 2021-03-05 DIAGNOSIS — E782 Mixed hyperlipidemia: Secondary | ICD-10-CM | POA: Diagnosis not present

## 2021-03-05 DIAGNOSIS — E119 Type 2 diabetes mellitus without complications: Secondary | ICD-10-CM

## 2021-03-05 DIAGNOSIS — I10 Essential (primary) hypertension: Secondary | ICD-10-CM | POA: Diagnosis not present

## 2021-03-05 DIAGNOSIS — M8660 Other chronic osteomyelitis, unspecified site: Secondary | ICD-10-CM

## 2021-03-05 MED ORDER — LINEZOLID 600 MG PO TABS: 600.0000 mg | ORAL_TABLET | Freq: Two times a day (BID) | ORAL | 0 refills | Status: DC

## 2021-03-05 NOTE — Telephone Encounter (Signed)
I wrote an appeal this week and they are still in the process of deciding. He can get 10 days of linezolid in the meantime. They should have a decision by early next week.   The linezolid was $37 for 10 days.

## 2021-03-05 NOTE — Patient Instructions (Signed)
Medication Instructions:  Your physician recommends that you continue on your current medications as directed. Please refer to the Current Medication list given to you today.  *If you need a refill on your cardiac medications before your next appointment, please call your pharmacy*   Lab Work: None If you have labs (blood work) drawn today and your tests are completely normal, you will receive your results only by: [place] (if you have MyChart) OR A paper copy in the mail If you have any lab test that is abnormal or we need to change your treatment, we will call you to review the results.   Testing/Procedures: None   Follow-Up: At [facility], you and your health needs are our priority.  As part of our continuing mission to provide you with exceptional heart care, we have created designated Provider Care Teams.  These Care Teams include your primary Cardiologist (physician) and Advanced Practice Providers (APPs -  Physician Assistants and Nurse Practitioners) who all work together to provide you with the care you need, when you need it.  We recommend signing up for the patient portal called "MyChart".  Sign up information is provided on this After Visit Summary.  MyChart is used to connect with patients for Virtual Visits (Telemedicine).  Patients are able to view lab/test results, encounter notes, upcoming appointments, etc.  Non-urgent messages can be sent to your provider as well.   To learn more about what you can do with MyChart, go to [email or web address].    Your next appointment:   6 month(s)  The format for your next appointment:   In Person      Other Instructions

## 2021-03-05 NOTE — Progress Notes (Addendum)
Cardiology Office Note:    Date:  04/06/2021   ID:  Luis Brown, DOB 1946-03-11, MRN [id number]  PCP:  Mackie Pai, PA-C  Cardiologist:  Berniece Salines, DO  Electrophysiologist:  None   Referring MD: Elise Benne   Chief Complaint  Patient presents with   Follow-up    History of Present Illness:    Luis Brown is a 75 y.o. male with a hx of paroxysmal atrial fibrillation on digoxin and Xarelto, hypertension, diabetes mellitus, hyperlipidemia is here today for follow-up visit.  I saw the patient on January 20, 2021 at that time he was in atrial fibrillation with rapid ventricular rate I started him on Cardizem 120 mg daily, in the meantime he had some problems with the Cardizem we therefore stopped that and transition the patient to digoxin.  He will his ZIO monitor which showed evidence of 3% A. fib burden and was last seen by my partner Dr. Agustin Cree at which time he appeared to be doing well.  He is here today for follow-up visit.  The only complaint he has is intermittent palpitations.  No other complaints he denies any shortness of breath or chest pain.  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   Esophageal reflux 02/10/2015   Fatigue    HTN (hypertension)    Hyperglycemia    preDM    Hyperlipidemia    Insomnia    Low back pain radiating to both legs 01/16/2017   Measles as a child   Overweight(278.02)    Peripheral neuropathy 08/07/2019   Personal history of colonic polyps 10/27/2012   Follows with [facility]    Pneumonia    " walking"   Preventative health care    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   Wears glasses     Past Surgical History:  Procedure Laterality Date   BACK SURGERY  2012 and 1994   Dr Margret Chance, screws and cage in low back   BIOPSY  01/09/2020   Procedure: BIOPSY;  Surgeon: Otis Brace, MD;  Location: [facility];  Service: Gastroenterology;;   BRONCHIAL BIOPSY  11/17/2020   Procedure: BRONCHIAL BIOPSIES;  Surgeon: Laurin Coder, MD;  Location: [facility];  Service: Endoscopy;;   BRONCHIAL BRUSHINGS  11/17/2020   Procedure: BRONCHIAL BRUSHINGS;  Surgeon: Laurin Coder, MD;  [facility];  Service: Endoscopy;;   BRONCHIAL WASHINGS  11/17/2020   Procedure: BRONCHIAL WASHINGS;  Surgeon: Laurin Coder, MD;  [facility];  Service: Endoscopy;;   COLONOSCOPY WITH PROPOFOL N/A 01/09/2020   Procedure: COLONOSCOPY WITH PROPOFOL;  Surgeon: Otis Brace, MD;  [facility]: [facility];  Service: Gastroenterology;  Laterality: N/A;   ESOPHAGOGASTRODUODENOSCOPY (EGD) WITH PROPOFOL N/A 01/09/2020   Procedure: ESOPHAGOGASTRODUODENOSCOPY (EGD) WITH PROPOFOL;  Surgeon: Otis Brace, MD;  [facility];  Service: Gastroenterology;  Laterality: N/A;   EYE SURGERY Bilateral    2016   HARDWARE REVISION  03/12/2019   REVISION OF HARDWARE THORACIC TEN-THORACIC ELEVEN WITH APPLICATION OF ADDITIONAL  RODS AND ROD SLEEVES THORACIC EIGHT-THORACIC TWELVE 12-LUMBAR ONE (N/A )   HEMOSTASIS CONTROL  11/17/2020   Procedure: HEMOSTASIS CONTROL;  Surgeon: Laurin Coder, MD;  [facility];  Service: Endoscopy;;   IR US GUIDE BX ASP/DRAIN  11/13/2020   LAMINECTOMY  02/12/2019   DECOMPRESSIVE LAMINECTOMY THORACIC NINE-THORACIC TEN AND THORACIC TEN-THORACIC ELEVEN, EXTENSION OF THORACOLUMBAR FUSION FROM THORACIC TEN TO THORACIC FIVE, LOCAL BONE GRAFT, ALLOGRAFT AND VIVIGEN (N/A)   POLYPECTOMY  01/09/2020   Procedure: POLYPECTOMY;  Surgeon: Otis Brace, MD;  [facility];  Service: Gastroenterology;;   REMOVAL OF RODS AND PEDICLE SCREWS T5 AND T6, EXPLORATION OF FUSION, BIOPSY TRANSPEDICULAR T5 AND T6 (N/A )  09/14/2020   SPINAL FUSION N/A 02/12/2019   Procedure: DECOMPRESSIVE LAMINECTOMY THORACIC NINE-THORACIC TEN AND THORACIC TEN-THORACIC ELEVEN, EXTENSION OF THORACOLUMBAR FUSION FROM THORACIC TEN TO THORACIC FIVE, LOCAL BONE GRAFT, ALLOGRAFT AND VIVIGEN;  Surgeon: Jessy Oto, MD;  Location: [place];  Service: Orthopedics;  Laterality: N/A;  DECOMPRESSIVE LAMINECTOMY THORACIC NINE-THORACIC TEN AND THORACIC TEN-THORACIC ELEVEN, EXTENSION OF TH   SPINAL FUSION N/A 03/12/2019   Procedure: REVISION OF HARDWARE THORACIC TEN-THORACIC ELEVEN WITH APPLICATION OF ADDITIONAL RODS AND ROD SLEEVES THORACIC EIGHT-THORACIC TWELVE 12-LUMBAR ONE;  Surgeon: Jessy Oto, MD;  Location: [place];  Service: Orthopedics;  Laterality: N/A;   TONSILLECTOMY     as child   torn rotator cuff  2010   right   VIDEO BRONCHOSCOPY N/A 11/17/2020   Procedure: VIDEO BRONCHOSCOPY WITH FLUORO;  Surgeon: Laurin Coder, MD;  [facility];  Service: Endoscopy;  Laterality: N/A;    Current Medications: Current Meds  Medication Sig   b complex vitamins capsule Take 1 capsule by mouth daily.   cetirizine (ZYRTEC) 10 MG tablet Take 10 mg by mouth daily. In the morning   digoxin (LANOXIN) 0.125 MG tablet Take 1 tablet (0.125 mg total) by mouth daily.   ferrous fumarate (FERRETTS) 325 (106 Fe) MG TABS tablet Take 1 tablet (106 mg of iron total) by mouth daily.   glucose blood (CONTOUR NEXT TEST) test strip Check blood sugar once daily   HYDROcodone-acetaminophen (NORCO) 10-325 MG tablet Take 1 tablet by mouth 5 (five) times daily as needed for pain.   metFORMIN (GLUCOPHAGE) 1000 MG tablet Take 1 tablet (1,000 mg total) by mouth 2 (two) times daily with a meal.   pantoprazole (PROTONIX) 40 MG tablet TAKE 1 TABLET BY MOUTH TWICE A DAY BEFORE A MEAL    pregabalin (LYRICA) 75 MG capsule TAKE 1 CAPSULE BY MOUTH TWICE A DAY   PROVENTIL HFA 108 (90 Base) MCG/ACT inhaler TAKE 2 PUFFS BY MOUTH EVERY 6 HOURS AS NEEDED FOR WHEEZE OR SHORTNESS OF BREATH   rivaroxaban (XARELTO) 20 MG TABS tablet Take 1 tablet (20 mg total) by mouth daily with supper.   sucralfate (CARAFATE) 1 g tablet TAKE 1 TABLET (1 G TOTAL) BY MOUTH 4 (FOUR) TIMES DAILY - WITH MEALS AND AT BEDTIME.   tamsulosin (FLOMAX) 0.4 MG CAPS capsule Take 2 capsules (0.8 mg total) by mouth daily.   testosterone cypionate (DEPOTESTOSTERONE CYPIONATE) 200 MG/ML injection INJECT 1 ML (200 MG TOTAL) INTO THE MUSCLE EVERY 14 (FOURTEEN) DAYS.   Tiotropium Bromide-Olodaterol (STIOLTO RESPIMAT) 2.5-2.5 MCG/ACT AERS Inhale 2 puffs into the lungs daily.   tiZANidine (ZANAFLEX) 4 MG tablet Take 1.5 tablets (6 mg total) by mouth every 6 (six) hours as needed for muscle spasms.   [EXPIRED] valACYclovir (VALTREX) 1000 MG tablet Take  1 tablet (1,000 mg total) by mouth 2 (two) times daily for 10 days.   venlafaxine XR (EFFEXOR-XR) 150 MG 24 hr capsule TAKE 1 CAPSULE BY MOUTH DAILY WITH BREAKFAST.   [DISCONTINUED] atorvastatin (LIPITOR) 10 MG tablet TAKE 1 TABLET BY MOUTH EVERY DAY   [DISCONTINUED] JANUVIA 50 MG tablet TAKE 1 TABLET BY MOUTH EVERY DAY   [DISCONTINUED] Tedizolid Phosphate 200 MG TABS Take 1 tablet by mouth daily.   [DISCONTINUED] zolpidem (AMBIEN) 5 MG tablet Take 1 tablet (5 mg total) by mouth at bedtime as needed for sleep.   Current Facility-Administered Medications for the 03/05/21 encounter (Office Visit) with Berniece Salines, DO  Medication   testosterone cypionate (DEPOTESTOSTERONE CYPIONATE) injection 200 mg   testosterone cypionate (DEPOTESTOSTERONE CYPIONATE) injection 200 mg   testosterone cypionate (DEPOTESTOSTERONE CYPIONATE) injection 200 mg   testosterone cypionate (DEPOTESTOSTERONE CYPIONATE) injection 200 mg     Allergies:   Daptomycin   Social History   Socioeconomic History    Marital status: Married    Spouse name: Not on file   Number of children: Not on file   Years of education: Not on file   Highest education level: Not on file  Occupational History   Not on file  Tobacco Use   Smoking status: Former    Packs/day: 1.00    Years: 20.00    Pack years: 20.00    Types: Cigarettes    Quit date: 07/18/1989    Years since quitting: 31.7   Smokeless tobacco: Never  Vaping Use   Vaping Use: Never used  Substance and Sexual Activity   Alcohol use: Not Currently   Drug use: No   Sexual activity: Yes    Comment: lives with wife, still working, no dietary restrictions, continues to exercise intermittently  Other Topics Concern   Not on file  Social History Narrative   Not on file   Social Determinants of Health   Financial Resource Strain: Low Risk    Difficulty of Paying Living Expenses: Not hard at all  Food Insecurity: No Food Insecurity   Worried About Charity fundraiser in the Last Year: Never true   Arboriculturist in the Last Year: Never true  Transportation Needs: No Transportation Needs   Lack of Transportation (Medical): No   Lack of Transportation (Non-Medical): No  Physical Activity: Sufficiently Active   Days of Exercise per Week: 7 days   Minutes of Exercise per Session: 30 min  Stress: No Stress Concern Present   Feeling of Stress : Not at all  Social Connections: Moderately Integrated   Frequency of Communication with Friends and Family: More than three times a week   Frequency of Social Gatherings with Friends and Family: More than three times a week   Attends Religious Services: More than 4 times per year   Active Member of [employer] or Organizations: No   Attends Music therapist: Never   Marital Status: Married     Family History: The patient's family history includes COPD in his father; Cancer (age of onset: 12) in his mother; Diabetes in his mother; Emphysema in his father; Heart disease in his daughter, father,  maternal grandfather, and paternal grandfather; Hyperlipidemia in his sister; Hypertension in his maternal grandmother, mother, and sister; Osteoporosis in his sister; Scoliosis in his maternal grandmother and sister; Stroke (age of onset: 38) in his father.  ROS:   Review of Systems  Constitution: Negative for decreased appetite, fever and weight gain.  HENT:  Negative for congestion, ear discharge, hoarse voice and sore throat.   Eyes: Negative for discharge, redness, vision loss in right eye and visual halos.  Cardiovascular: Negative for chest pain, dyspnea on exertion, leg swelling, orthopnea and palpitations.  Respiratory: Negative for cough, hemoptysis, shortness of breath and snoring.   Endocrine: Negative for heat intolerance and polyphagia.  Hematologic/Lymphatic: Negative for bleeding problem. Does not bruise/bleed easily.  Skin: Negative for flushing, nail changes, rash and suspicious lesions.  Musculoskeletal: Negative for arthritis, joint pain, muscle cramps, myalgias, neck pain and stiffness.  Gastrointestinal: Negative for abdominal pain, bowel incontinence, diarrhea and excessive appetite.  Genitourinary: Negative for decreased libido, genital sores and incomplete emptying.  Neurological: Negative for brief paralysis, focal weakness, headaches and loss of balance.  Psychiatric/Behavioral: Negative for altered mental status, depression and suicidal ideas.  Allergic/Immunologic: Negative for HIV exposure and persistent infections.    EKGs/Labs/Other Studies Reviewed:    The following studies were reviewed today:   EKG:  The ekg ordered today demonstrates sinus rhythm, heart rate 70 bpm.  Zio monitor  Patch Wear Time:  12 days and 20 hours January 20, 2021 Indication: Paroxysmal atrial fibrillation   Patient had a min HR of 47 bpm, max HR of 227 bpm, and avg HR of 77 bpm.    Predominant underlying rhythm was Sinus Rhythm.    Atrial Fibrillation/Flutter occurred (3%  burden), ranging from 72-227 bpm (avg of 143 bpm), the longest lasting 1 hour 10 mins with an average rate of 134 bpm.   Premature atrial complexes  were occasional (1.1%, 15374). Premature ventricular complexes were  rare (<1.0%).     Symptoms were associated with atrial fibrillation.   No AV block, no ventricular tachycardia noted.   Conclusion: This study is remarkable for symptomatic paroxysmal atrial fibrillation and occasional premature atrial complexes.   Transthoracic echocardiogram November 11, 2020 IMPRESSIONS     1. Left ventricular ejection fraction, by estimation, is 60 to 65%. The  left ventricle has normal function. The left ventricle has no regional  wall motion abnormalities. Left ventricular diastolic parameters were  normal.   2. Right ventricular systolic function is normal. The right ventricular  size is mildly enlarged.   3. The mitral valve is normal in structure. No evidence of mitral valve  regurgitation.   4. The aortic valve was not well visualized. Aortic valve regurgitation  is not visualized. No aortic stenosis is present.   5. The inferior vena cava is normal in size with greater than 50%  respiratory variability, suggesting right atrial pressure of 3 mmHg.   Conclusion(s)/Recommendation(s): No vegetation seen. If high clinical  suspicion for endocarditis consider TEE.   FINDINGS   Left Ventricle: Left ventricular ejection fraction, by estimation, is 60  to 65%. The left ventricle has normal function. The left ventricle has no  regional wall motion abnormalities. The left ventricular internal cavity  size was normal in size. There is   no left ventricular hypertrophy. Left ventricular diastolic parameters  were normal.   Right Ventricle: The right ventricular size is mildly enlarged. No  increase in right ventricular wall thickness. Right ventricular systolic  function is normal.   Left Atrium: Left atrial size was normal in size.   Right  Atrium: Right atrial size was normal in size.   Pericardium: Trivial pericardial effusion is present.   Mitral Valve: The mitral valve is normal in structure. No evidence of  mitral valve regurgitation.   Tricuspid Valve: The tricuspid valve is  normal in structure. Tricuspid  valve regurgitation is not demonstrated.   Aortic Valve: The aortic valve was not well visualized. Aortic valve  regurgitation is not visualized. No aortic stenosis is present.   Pulmonic Valve: The pulmonic valve was not well visualized. Pulmonic valve  regurgitation is not visualized.   Aorta: The aortic root is normal in size and structure.   Venous: The inferior vena cava is normal in size with greater than 50%  respiratory variability, suggesting right atrial pressure of 3 mmHg.   IAS/Shunts: The interatrial septum was not well visualized.   Recent Labs: 11/10/2020: B Natriuretic Peptide 68.6 12/31/2020: TSH 2.36 02/01/2021: Magnesium 1.6 03/15/2021: ALT 7; BUN 12; Creat 0.66; Hemoglobin 14.0; Platelets 397; Potassium 4.0; Sodium 136  Recent Lipid Panel    Component Value Date/Time   CHOL 112 07/01/2020 0857   TRIG 66.0 07/01/2020 0857   HDL 56.10 07/01/2020 0857   CHOLHDL 2 07/01/2020 0857   VLDL 13.2 07/01/2020 0857   LDLCALC 42 07/01/2020 0857   LDLDIRECT 64.0 10/30/2017 1502    Physical Exam:    VS:  BP 126/76 (BP Location: Right Arm, Patient Position: Sitting, Cuff Size: Normal)   Pulse 68   Ht '5\' 7"'$  (1.702 m)   Wt 165 lb (74.8 kg)   SpO2 98%   BMI 25.84 kg/m     Wt Readings from Last 3 Encounters:  04/01/21 157 lb (71.2 kg)  03/26/21 157 lb (71.2 kg)  03/17/21 157 lb (71.2 kg)     GEN: Well nourished, well developed in no acute distress HEENT: Normal NECK: No JVD; No carotid bruits LYMPHATICS: No lymphadenopathy CARDIAC: S1S2 noted,RRR, no murmurs, rubs, gallops RESPIRATORY:  Clear to auscultation without rales, wheezing or rhonchi  ABDOMEN: Soft, non-tender, non-distended,  +bowel sounds, no guarding. EXTREMITIES: No edema, No cyanosis, no clubbing MUSCULOSKELETAL:  No deformity  SKIN: Warm and dry NEUROLOGIC:  Alert and oriented x 3, non-focal PSYCHIATRIC:  Normal affect, good insight  ASSESSMENT:    1. PAF (paroxysmal atrial fibrillation) ([place])   2. Hypertension, unspecified type   3. Diabetes mellitus without complication ([place])   4. Mixed hyperlipidemia    PLAN:    He is doing well from a CV standpoint.  He is in sinus rhythm today. I would like the patient to see my EP partner for evaluation of atrial fibrillation ablation. For now will continue the Cardizem and xeralto.   Bloop pressure is acceptable at this time.   Continue on statin.   Diabetes is being managed by his pcp.   The patient is in agreement with the above plan. The patient left the office in stable condition.  The patient will follow up in   Medication Adjustments/Labs and Tests Ordered: Current medicines are reviewed at length with the patient today.  Concerns regarding medicines are outlined above.  Orders Placed This Encounter  Procedures   Ambulatory referral to Cardiac Electrophysiology   EKG 12-Lead   No orders of the defined types were placed in this encounter.   Patient Instructions  Medication Instructions:  Your physician recommends that you continue on your current medications as directed. Please refer to the Current Medication list given to you today.  *If you need a refill on your cardiac medications before your next appointment, please call your pharmacy*   Lab Work: None If you have labs (blood work) drawn today and your tests are completely normal, you will receive your results only by: Honor (if you have MyChart) OR  A paper copy in the mail If you have any lab test that is abnormal or we need to change your treatment, we will call you to review the results.   Testing/Procedures: None   Follow-Up: At [facility], you and your health  needs are our priority.  As part of our continuing mission to provide you with exceptional heart care, we have created designated Provider Care Teams.  These Care Teams include your primary Cardiologist (physician) and Advanced Practice Providers (APPs -  Physician Assistants and Nurse Practitioners) who all work together to provide you with the care you need, when you need it.  We recommend signing up for the patient portal called "MyChart".  Sign up information is provided on this After Visit Summary.  MyChart is used to connect with patients for Virtual Visits (Telemedicine).  Patients are able to view lab/test results, encounter notes, upcoming appointments, etc.  Non-urgent messages can be sent to your provider as well.   To learn more about what you can do with MyChart, go to [email or web address].    Your next appointment:   6 month(s)  The format for your next appointment:   In Person      Other Instructions    Adopting a Healthy Lifestyle.  Know what a healthy weight is for you (roughly BMI <25) and aim to maintain this   Aim for 7+ servings of fruits and vegetables daily   65-80+ fluid ounces of water or unsweet tea for healthy kidneys   Limit to max 1 drink of alcohol per day; avoid smoking/tobacco   Limit animal fats in diet for cholesterol and heart health - choose grass fed whenever available   Avoid highly processed foods, and foods high in saturated/trans fats   Aim for low stress - take time to unwind and care for your mental health   Aim for 150 min of moderate intensity exercise weekly for heart health, and weights twice weekly for bone health   Aim for 7-9 hours of sleep daily   When it comes to diets, agreement about the perfect plan isnt easy to find, even among the experts. Experts at the [employer] developed an idea known as the Healthy Eating Plate. Just imagine a plate divided into logical, healthy portions.   The emphasis is  on diet quality:   Load up on vegetables and fruits - one-half of your plate: Aim for color and variety, and remember that potatoes dont count.   Go for whole grains - one-quarter of your plate: Whole wheat, barley, wheat berries, quinoa, oats, brown rice, and foods made with them. If you want pasta, go with whole wheat pasta.   Protein power - one-quarter of your plate: Fish, chicken, beans, and nuts are all healthy, versatile protein sources. Limit red meat.   The diet, however, does go beyond the plate, offering a few other suggestions.   Use healthy plant oils, such as olive, canola, soy, corn, sunflower and peanut. Check the labels, and avoid partially hydrogenated oil, which have unhealthy trans fats.   If youre thirsty, drink water. Coffee and tea are good in moderation, but skip sugary drinks and limit milk and dairy products to one or two daily servings.   The type of carbohydrate in the diet is more important than the amount. Some sources of carbohydrates, such as vegetables, fruits, whole grains, and beans-are healthier than others.   Finally, stay active  Signed, Berniece Salines, DO  04/06/2021 8:57 AM  [facility]

## 2021-03-08 ENCOUNTER — Telehealth: Payer: Self-pay

## 2021-03-08 ENCOUNTER — Other Ambulatory Visit (HOSPITAL_COMMUNITY): Payer: Self-pay

## 2021-03-08 DIAGNOSIS — G894 Chronic pain syndrome: Secondary | ICD-10-CM | POA: Diagnosis not present

## 2021-03-08 DIAGNOSIS — M961 Postlaminectomy syndrome, not elsewhere classified: Secondary | ICD-10-CM | POA: Diagnosis not present

## 2021-03-08 DIAGNOSIS — M4624 Osteomyelitis of vertebra, thoracic region: Secondary | ICD-10-CM | POA: Diagnosis not present

## 2021-03-08 DIAGNOSIS — M150 Primary generalized (osteo)arthritis: Secondary | ICD-10-CM | POA: Diagnosis not present

## 2021-03-08 NOTE — Telephone Encounter (Signed)
[facility] Patient Advocate Encounter  Prior Authorization for Luis Brown has been approved.    PA# [id number] Effective dates: 03/08/21 through 07/17/21  Patients co-pay is $1222.90.   [facility] will continue to follow.  [name], [name] Patient [facility] for Infectious Disease Phone: [phone] Fax:  [phone]

## 2021-03-09 ENCOUNTER — Encounter: Payer: Self-pay | Admitting: Specialist

## 2021-03-10 ENCOUNTER — Telehealth: Payer: Self-pay

## 2021-03-10 ENCOUNTER — Other Ambulatory Visit (HOSPITAL_COMMUNITY): Payer: Self-pay

## 2021-03-10 NOTE — Telephone Encounter (Signed)
[facility] Patient Advocate Encounter  Prior Authorization for Elesa Hacker  has been approved.    PA# [id number]  Effective dates: 03/10/21 through 07/17/21  Patients co-pay is $1315.89 for 60 tablets for 30 days.  $930.08 for 30 tablets for 30 days.  There are not any copay assistance for this patient with medicare part d.   [facility] will continue to follow.  [name], [facility] Patient [facility] for Infectious Disease Phone: [phone] Fax:  [phone]

## 2021-03-10 NOTE — Telephone Encounter (Signed)
[facility] Patient Advocate Encounter   Received notification from [employer] Medicare Part D that prior authorization for Luis Brown is required.   PA submitted on 03/10/21 Key [id number] Status is pending    [facility] will continue to follow.   [name], [facility] Patient [facility] for Infectious Disease Phone: [phone] Fax:  [phone]

## 2021-03-11 ENCOUNTER — Telehealth: Payer: Self-pay | Admitting: Pharmacist

## 2021-03-11 DIAGNOSIS — L089 Local infection of the skin and subcutaneous tissue, unspecified: Secondary | ICD-10-CM

## 2021-03-11 DIAGNOSIS — M8660 Other chronic osteomyelitis, unspecified site: Secondary | ICD-10-CM

## 2021-03-11 MED ORDER — TEDIZOLID PHOSPHATE 200 MG PO TABS: 200.0000 mg | ORAL_TABLET | Freq: Every day | ORAL | 1 refills | Status: DC

## 2021-03-11 NOTE — Telephone Encounter (Signed)
Called patient to discuss antibiotic plan. One month of tedizolid will cost him ~$1200. One month of omadacycline with cost him ~$1300. He is still having significant pain despite being on linezolid for a week. Per Dr. Linus Salmons, will transition to tedizolid. Patient is willing to pay copay if we believe it is the best plan for him. He has a follow up with Dr. Juleen China Wednesday 8/31. May need to find alternative option for continuation of therapy after the month of tedizolid as therapy is cost prohibitive. There is a chance it would be cheaper for a second month depending on his Medicare deductible.   Linezolid is not a good long term option as he is on Effexor and would be at risk for toxicity/serotonin syndrome if he takes those together (contraindicated).

## 2021-03-11 NOTE — Telephone Encounter (Signed)
Patient called requesting antibiotics be sent to pharmacy. Forwarding to pharmacy team to contact patient about copays and plan.   Luis Brown [name], RN

## 2021-03-12 ENCOUNTER — Other Ambulatory Visit (HOSPITAL_COMMUNITY): Payer: Self-pay

## 2021-03-12 ENCOUNTER — Other Ambulatory Visit: Payer: Self-pay | Admitting: Pharmacist

## 2021-03-12 ENCOUNTER — Telehealth: Payer: Self-pay

## 2021-03-12 DIAGNOSIS — M8660 Other chronic osteomyelitis, unspecified site: Secondary | ICD-10-CM

## 2021-03-12 DIAGNOSIS — L089 Local infection of the skin and subcutaneous tissue, unspecified: Secondary | ICD-10-CM

## 2021-03-12 MED ORDER — TEDIZOLID PHOSPHATE 200 MG PO TABS
200.0000 mg | ORAL_TABLET | Freq: Every day | ORAL | 1 refills | Status: DC
  Filled 2021-03-12: qty 30, 30d supply, fill #0

## 2021-03-12 NOTE — Telephone Encounter (Signed)
Patient's wife called office states that [facility] is not able to order Tedizolid and has not heard of the Rx before. Would like to know if office could help send medication to a different pharmacy. Spoke with Cassie, Pharmacist who will send Rx to [facility]. Patient was updated and will wait on call from [facility] long. Luis Brown, RMA

## 2021-03-13 ENCOUNTER — Other Ambulatory Visit: Payer: Self-pay | Admitting: Medical

## 2021-03-15 ENCOUNTER — Telehealth: Payer: Self-pay

## 2021-03-15 ENCOUNTER — Other Ambulatory Visit: Payer: Self-pay

## 2021-03-15 ENCOUNTER — Other Ambulatory Visit (HOSPITAL_COMMUNITY): Payer: Self-pay

## 2021-03-15 ENCOUNTER — Other Ambulatory Visit: Payer: Medicare Other

## 2021-03-15 DIAGNOSIS — M8660 Other chronic osteomyelitis, unspecified site: Secondary | ICD-10-CM

## 2021-03-15 NOTE — Telephone Encounter (Signed)
Patient's wife called, says patient is due to start new antibiotic today. She is asking if patient can come in and have lab work done today prior to starting new antibiotic. RN advised patient is scheduled to see Dr. Juleen China 03/17/21 and that labs can be done then. Will route to provider.   Beryle Flock, RN

## 2021-03-15 NOTE — Telephone Encounter (Signed)
Spoke with patient, he agrees to come in for labs this afternoon. Orders placed per Dr. Juleen China.   Beryle Flock, RN

## 2021-03-15 NOTE — Telephone Encounter (Signed)
No problem! I am not aware of using lower doses for suppression either. Maybe because of his age and kidney function concerns?

## 2021-03-15 NOTE — Addendum Note (Signed)
Addended by: Lucie Leather D on: 03/15/2021 12:14 PM   Modules accepted: Orders

## 2021-03-16 ENCOUNTER — Ambulatory Visit: Payer: Medicare Other

## 2021-03-16 LAB — CBC
HCT: 43.5 % (ref 38.5–50.0)
Hemoglobin: 14 g/dL (ref 13.2–17.1)
MCH: 29.1 pg (ref 27.0–33.0)
MCHC: 32.2 g/dL (ref 32.0–36.0)
MCV: 90.4 fL (ref 80.0–100.0)
MPV: 8.5 fL (ref 7.5–12.5)
Platelets: 397 10*3/uL (ref 140–400)
RBC: 4.81 10*6/uL (ref 4.20–5.80)
RDW: 13.7 % (ref 11.0–15.0)
WBC: 7.5 10*3/uL (ref 3.8–10.8)

## 2021-03-16 LAB — C-REACTIVE PROTEIN: CRP: 9.7 mg/L — ABNORMAL HIGH (ref <8.0)

## 2021-03-16 LAB — COMPREHENSIVE METABOLIC PANEL
AG Ratio: 2.1 (calc) (ref 1.0–2.5)
ALT: 7 U/L — ABNORMAL LOW (ref 9–46)
AST: 14 U/L (ref 10–35)
Albumin: 4.4 g/dL (ref 3.6–5.1)
Alkaline phosphatase (APISO): 80 U/L (ref 35–144)
BUN/Creatinine Ratio: 18 (calc) (ref 6–22)
BUN: 12 mg/dL (ref 7–25)
CO2: 31 mmol/L (ref 20–32)
Calcium: 9.6 mg/dL (ref 8.6–10.3)
Chloride: 96 mmol/L — ABNORMAL LOW (ref 98–110)
Creat: 0.66 mg/dL — ABNORMAL LOW (ref 0.70–1.28)
Globulin: 2.1 g/dL (calc) (ref 1.9–3.7)
Glucose, Bld: 96 mg/dL (ref 65–99)
Potassium: 4 mmol/L (ref 3.5–5.3)
Sodium: 136 mmol/L (ref 135–146)
Total Bilirubin: 0.5 mg/dL (ref 0.2–1.2)
Total Protein: 6.5 g/dL (ref 6.1–8.1)

## 2021-03-16 LAB — SEDIMENTATION RATE: Sed Rate: 9 mm/h (ref 0–20)

## 2021-03-17 ENCOUNTER — Ambulatory Visit (INDEPENDENT_AMBULATORY_CARE_PROVIDER_SITE_OTHER): Payer: Medicare Other | Admitting: Internal Medicine

## 2021-03-17 ENCOUNTER — Other Ambulatory Visit: Payer: Self-pay

## 2021-03-17 ENCOUNTER — Encounter: Payer: Self-pay | Admitting: Internal Medicine

## 2021-03-17 VITALS — BP 153/92 | HR 92 | Wt 157.0 lb

## 2021-03-17 DIAGNOSIS — M4624 Osteomyelitis of vertebra, thoracic region: Secondary | ICD-10-CM

## 2021-03-17 DIAGNOSIS — J8281 Chronic eosinophilic pneumonia: Secondary | ICD-10-CM | POA: Diagnosis not present

## 2021-03-17 NOTE — Progress Notes (Signed)
[facility]  CHIEF COMPLAINT:    Follow up for back infection  SUBJECTIVE:    Luis Brown is a 75 y.o. male with PMHx as below who presents to the clinic for back infection.   He has a long history of spinal infection.  He has a history of T8-L1 fusion in 2015.  He was seen in 2018 by Dr. Louanne Skye when imaging showed a rod breakage and loosening of the pedicle screws.  Underwent replacement of the L5-S1 fusion with bone grafting.  He had rods placed in 2020 and cultures grew P acnes.  He was placed on ceftriaxone then transitioned to long-term doxycycline.  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  He was then doing relatively well but a follow-up MRI was concerning for infection and area of hardware removed, however, he did have remaining hardware from other areas of his back..  Culture then grew out staph epidermis.  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  However, he developed significant electrolyte abnormalities with Bactrim.  At that point he was observed off antibiotics and was last seen by me on 02/04/2021.  Shortly after I saw patient on 02/04/2021, he developed worsening back pain.  MRI was completed which was concerning for infection again.  Inflammatory markers were also increased with ESR 33 (increased from 6) and CRP 115.2 (increased from 30.4).  He was seen by my partner, Dr. Linus Salmons, on 03/02/2021.  He was restarted on antibiotics with linezolid followed by Tedizolid (which required insurance preauthorization).  Unfortunately, a 30-day supply is about $1200.  However, it is possible that this will be cheaper for second month depending on his Medicare deductible.  He had labs drawn earlier this week 03/15/2021 which showed that his ESR has normalized at 9, CRP down to 9.7.  WBC and platelets normal, creatinine 0.6, AST 14,  ALT 7.  He started Tedizolid this Monday 03/15/21. He continues to have terrible back pain and feels that there is crampy and spasm like discomfort.  It seems worsened when sitting back in a chair and relieved by lying down.  No fevers or chills.  He seems to be tolerating the antibiotics okay but is understandably frustrated and is unsure what the long term solution will be for his infection.    Please see A&P for the details of today's visit and status of the patient's medical problems.   Patient's Medications  New Prescriptions   No medications on file  Previous Medications   ATORVASTATIN (LIPITOR) 10 MG TABLET    TAKE 1 TABLET BY MOUTH EVERY DAY   B COMPLEX VITAMINS CAPSULE    Take 1 capsule by mouth daily.   CETIRIZINE (ZYRTEC) 10 MG TABLET    Take 10 mg by mouth daily. In the morning   DIGOXIN (LANOXIN) 0.125 MG TABLET    Take 1 tablet (0.125 mg total) by mouth daily.   FERROUS FUMARATE (FERRETTS) 325 (106 FE) MG TABS TABLET    Take 1 tablet (106 mg of iron total) by mouth daily.   GLUCOSE BLOOD (CONTOUR NEXT TEST) TEST STRIP    Check blood sugar once daily   HYDROCODONE-ACETAMINOPHEN (NORCO) 10-325 MG TABLET    Take 1 tablet by mouth 5 (five) times daily as needed for pain.   JANUVIA 50 MG TABLET    TAKE 1 TABLET BY MOUTH EVERY  DAY   METFORMIN (GLUCOPHAGE) 1000 MG TABLET    Take 1 tablet (1,000 mg total) by mouth 2 (two) times daily with a meal.   PANTOPRAZOLE (PROTONIX) 40 MG TABLET    TAKE 1 TABLET BY MOUTH TWICE A DAY BEFORE A MEAL   PREGABALIN (LYRICA) 75 MG CAPSULE    TAKE 1 CAPSULE BY MOUTH TWICE A DAY   PROVENTIL HFA 108 (90 BASE) MCG/ACT INHALER    TAKE 2 PUFFS BY MOUTH EVERY 6 HOURS AS NEEDED FOR WHEEZE OR SHORTNESS OF BREATH   RIVAROXABAN (XARELTO) 20 MG TABS TABLET    Take 1 tablet (20 mg total) by mouth daily with supper.   SUCRALFATE (CARAFATE) 1 G TABLET    TAKE 1 TABLET (1 G TOTAL) BY MOUTH 4 (FOUR) TIMES DAILY - WITH MEALS AND AT BEDTIME.   TAMSULOSIN (FLOMAX) 0.4 MG  CAPS CAPSULE    Take 2 capsules (0.8 mg total) by mouth daily.   TEDIZOLID PHOSPHATE 200 MG TABS    Take 1 tablet by mouth daily.   TESTOSTERONE CYPIONATE (DEPOTESTOSTERONE CYPIONATE) 200 MG/ML INJECTION    INJECT 1 ML (200 MG TOTAL) INTO THE MUSCLE EVERY 14 (FOURTEEN) DAYS.   TIOTROPIUM BROMIDE-OLODATEROL (STIOLTO RESPIMAT) 2.5-2.5 MCG/ACT AERS    Inhale 2 puffs into the lungs daily.   TIZANIDINE (ZANAFLEX) 4 MG TABLET    Take 1.5 tablets (6 mg total) by mouth every 6 (six) hours as needed for muscle spasms.   VENLAFAXINE XR (EFFEXOR-XR) 150 MG 24 HR CAPSULE    TAKE 1 CAPSULE BY MOUTH DAILY WITH BREAKFAST.   ZOLPIDEM (AMBIEN) 5 MG TABLET    Take 1 tablet (5 mg total) by mouth at bedtime as needed for sleep.  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   Esophageal reflux 02/10/2015   Fatigue    HTN (hypertension)    Hyperglycemia    preDM    Hyperlipidemia    Insomnia    Low back pain radiating to both legs 01/16/2017   Measles as a child   Overweight(278.02)    Peripheral neuropathy 08/07/2019   Personal history of colonic polyps 10/27/2012   Follows with [facility]   Pneumonia    " walking"   Preventative health care    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   Wears glasses     Social History   Tobacco Use   Smoking status: Former    Packs/day: 1.00    Years: 20.00    Pack years: 20.00    Types: Cigarettes    Quit date: 07/18/1989     Years since quitting: 31.6   Smokeless tobacco: Never  Vaping Use   Vaping Use: Never used  Substance Use Topics   Alcohol use: Not Currently   Drug use: No    Family History  Problem Relation Age of Onset   Hypertension Mother  Diabetes Mother        type 2   Cancer Mother 71       breast in remission   Emphysema Father        smoker   COPD Father        smoker   Stroke Father 68       mini   Heart disease Father    Hypertension Sister    Hyperlipidemia Sister    Scoliosis Sister    Osteoporosis Sister    Hypertension Maternal Grandmother    Scoliosis Maternal Grandmother    Heart disease Maternal Grandfather    Heart disease Paternal Grandfather        smoker   Heart disease Daughter     Allergies  Allergen Reactions   Daptomycin     Drug induced pneumonia     Review of Systems  Constitutional: Negative.   Respiratory: Negative.    Gastrointestinal: Negative.   Musculoskeletal:  Positive for back pain.  Skin: Negative.     OBJECTIVE:    Vitals:   03/17/21 1519  BP: (!) 153/92  Pulse: 92  Weight: 157 lb (71.2 kg)   Body mass index is 24.59 kg/m.  Physical Exam Constitutional:      General: He is not in acute distress.    Appearance: Normal appearance.  HENT:     Head: Normocephalic and atraumatic.  Pulmonary:     Effort: Pulmonary effort is normal. No respiratory distress.  Musculoskeletal:     Comments: Midline thoracic incision well healed without erythema or drainage.  No signficant soft tissue abnormality other than paraspinal muscle tension R>L.  Neurological:     General: No focal deficit present.     Mental Status: He is alert and oriented to person, place, and time.  Psychiatric:        Mood and Affect: Mood normal.        Behavior: Behavior normal.     Labs and Microbiology: CBC Latest Ref Rng & Units 03/15/2021 01/07/2021 01/05/2021  WBC 3.8 - 10.8 Thousand/uL 7.5 8.9 7.7  Hemoglobin 13.2 - 17.1 g/dL 14.0 11.8(L) 11.1(L)   Hematocrit 38.5 - 50.0 % 43.5 35.4(L) 33.3(L)  Platelets 140 - 400 Thousand/uL 397 450.0(H) 367.0   CMP Latest Ref Rng & Units 03/15/2021 02/01/2021 01/05/2021  Glucose 65 - 99 mg/dL 96 115(H) 187(H)  BUN 7 - 25 mg/dL $Remove'12 12 16  'heEJEGa$ Creatinine 0.70 - 1.28 mg/dL 0.66(L) 0.63(L) 0.56  Sodium 135 - 146 mmol/L 136 138 134(L)  Potassium 3.5 - 5.3 mmol/L 4.0 4.4 4.5  Chloride 98 - 110 mmol/L 96(L) 98 95(L)  CO2 20 - 32 mmol/L $RemoveB'31 27 31  'iQDFgyOq$ Calcium 8.6 - 10.3 mg/dL 9.6 9.5 9.3  Total Protein 6.1 - 8.1 g/dL 6.5 - 5.9(L)  Total Bilirubin 0.2 - 1.2 mg/dL 0.5 - 0.9  Alkaline Phos 39 - 117 U/L - - 65  AST 10 - 35 U/L 14 - 15  ALT 9 - 46 U/L 7(L) - 21     No results found for this or any previous visit (from the past 240 hour(s)).  Imaging:  MRI 02/20/21 IMPRESSION: 1. Increased epidural enhancement from T4-T9 which may reflect infectious phlegmon with increased effacement of the thecal sac and persistent paravertebral soft tissue inflammation. No drainable fluid collection identified. 2. Increased edema and enhancement in the T6 and T7 vertebral bodies suspicious for osteomyelitis although there is no interval endplate erosion or disc collapse to indicate associated discitis. 3. Mildly  improved appearance of T4-5 discitis-osteomyelitis. 4. 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   ASSESSMENT & PLAN:    1. 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[place]) He had concern for worsening infection based on MRI findings 02/20/21 coupled with increased ESR/CRP.  The imaging showed increased edema at T6-7 vertebral bodies suspicious for OM although there was no interval endplate erosion or disc collapse to indicate associated discitis.  Also, noted was a T4-9 enhancement that could be infectious phlegmon but no abscess or drainable collection.  Other findings were improved appearance of T4-5 discitis/OM and previously treated T9-10 discitis.  Now back on antibiotics with linezolid x 10 days and tedizolid  since Monday 8/29.  Still having significant back pain not improved with current pain regimen.  Inflammatory markers have improved.  Discussed with Dr Louanne Skye and will obtain repeat MRI at this point given progressive symptoms.  Will continue Tedizolid (he has a 30 day supply) and follow up in 3-4 weeks.  Will need to determine suppressive regimen going forward and may do dalbavancin infusions for suppression.  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  He is no longer taking steroids.     Raynelle [facility] for Infectious [facility] 03/17/2021, 3:56 PM   I spent 40 minutes dedicated to the care of this patient on the date of this encounter to include pre-visit review of records, face-to-face time with the patient discussing discitis/om,  and post-visit ordering of testing.

## 2021-03-17 NOTE — Patient Instructions (Signed)
Thank you for coming to see me today. It was a pleasure seeing you.  To Do: Continue Tedizolid for the next 30 days I will discus with Dr Louanne Skye Please come back in about 3-4 weeks  If you have any questions or concerns, please do not hesitate to call the office at [phone].  Take Care,   Jule Ser

## 2021-03-18 ENCOUNTER — Encounter: Payer: Self-pay | Admitting: Internal Medicine

## 2021-03-19 MED ORDER — STIOLTO RESPIMAT 2.5-2.5 MCG/ACT IN AERS: 2.0000 | INHALATION_SPRAY | Freq: Every day | RESPIRATORY_TRACT | 3 refills | Status: DC

## 2021-03-23 ENCOUNTER — Ambulatory Visit (HOSPITAL_COMMUNITY)
Admission: RE | Admit: 2021-03-23 | Discharge: 2021-03-23 | Disposition: A | Discharge status: Home / Self Care | Payer: Medicare Other | Source: Ambulatory Visit | Attending: Internal Medicine | Admitting: Internal Medicine

## 2021-03-23 ENCOUNTER — Ambulatory Visit (HOSPITAL_COMMUNITY): Admission: RE | Admit: 2021-03-23 | Payer: Medicare Other | Source: Ambulatory Visit

## 2021-03-23 DIAGNOSIS — M4624 Osteomyelitis of vertebra, thoracic region: Secondary | ICD-10-CM | POA: Insufficient documentation

## 2021-03-23 DIAGNOSIS — M4804 Spinal stenosis, thoracic region: Secondary | ICD-10-CM | POA: Diagnosis not present

## 2021-03-23 DIAGNOSIS — M4313 Spondylolisthesis, cervicothoracic region: Secondary | ICD-10-CM | POA: Diagnosis not present

## 2021-03-23 MED ORDER — GADOBUTROL 1 MMOL/ML IV SOLN
7.0000 mL | Freq: Once | INTRAVENOUS | Status: AC | PRN
  Administered 2021-03-23: 7 mL via INTRAVENOUS

## 2021-03-25 ENCOUNTER — Telehealth: Payer: Self-pay

## 2021-03-25 ENCOUNTER — Encounter: Payer: Self-pay | Admitting: Specialist

## 2021-03-25 NOTE — Telephone Encounter (Signed)
Received call from [place] at [facility] to inform Dr. Juleen China MRI results are in from 9/7. Results are available in Epic. Will forward message to MD. Leatrice Jewels, RMA

## 2021-03-26 ENCOUNTER — Encounter: Payer: Self-pay | Admitting: Specialist

## 2021-03-26 ENCOUNTER — Other Ambulatory Visit: Payer: Self-pay

## 2021-03-26 ENCOUNTER — Ambulatory Visit (INDEPENDENT_AMBULATORY_CARE_PROVIDER_SITE_OTHER): Payer: Medicare Other

## 2021-03-26 ENCOUNTER — Telehealth: Payer: Self-pay | Admitting: Specialist

## 2021-03-26 ENCOUNTER — Ambulatory Visit (INDEPENDENT_AMBULATORY_CARE_PROVIDER_SITE_OTHER): Payer: Medicare Other | Admitting: Specialist

## 2021-03-26 ENCOUNTER — Other Ambulatory Visit: Payer: Self-pay | Admitting: Medical

## 2021-03-26 VITALS — BP 138/85 | HR 98 | Ht 67.0 in | Wt 157.0 lb

## 2021-03-26 DIAGNOSIS — I2510 Atherosclerotic heart disease of native coronary artery without angina pectoris: Secondary | ICD-10-CM | POA: Diagnosis not present

## 2021-03-26 DIAGNOSIS — G588 Other specified mononeuropathies: Secondary | ICD-10-CM | POA: Diagnosis not present

## 2021-03-26 DIAGNOSIS — M4325 Fusion of spine, thoracolumbar region: Secondary | ICD-10-CM

## 2021-03-26 DIAGNOSIS — Z9889 Other specified postprocedural states: Secondary | ICD-10-CM

## 2021-03-26 DIAGNOSIS — E291 Testicular hypofunction: Secondary | ICD-10-CM | POA: Diagnosis not present

## 2021-03-26 DIAGNOSIS — M4620 Osteomyelitis of vertebra, site unspecified: Secondary | ICD-10-CM | POA: Diagnosis not present

## 2021-03-26 DIAGNOSIS — Z981 Arthrodesis status: Secondary | ICD-10-CM

## 2021-03-26 DIAGNOSIS — M869 Osteomyelitis, unspecified: Secondary | ICD-10-CM

## 2021-03-26 DIAGNOSIS — M546 Pain in thoracic spine: Secondary | ICD-10-CM | POA: Diagnosis not present

## 2021-03-26 DIAGNOSIS — M5414 Radiculopathy, thoracic region: Secondary | ICD-10-CM | POA: Diagnosis not present

## 2021-03-26 DIAGNOSIS — M4324 Fusion of spine, thoracic region: Secondary | ICD-10-CM | POA: Diagnosis not present

## 2021-03-26 DIAGNOSIS — M4714 Other spondylosis with myelopathy, thoracic region: Secondary | ICD-10-CM

## 2021-03-26 DIAGNOSIS — M4644 Discitis, unspecified, thoracic region: Secondary | ICD-10-CM | POA: Diagnosis not present

## 2021-03-26 DIAGNOSIS — R7989 Other specified abnormal findings of blood chemistry: Secondary | ICD-10-CM

## 2021-03-26 DIAGNOSIS — M4319 Spondylolisthesis, multiple sites in spine: Secondary | ICD-10-CM | POA: Diagnosis not present

## 2021-03-26 DIAGNOSIS — G061 Intraspinal abscess and granuloma: Secondary | ICD-10-CM | POA: Diagnosis not present

## 2021-03-26 MED ORDER — TESTOSTERONE CYPIONATE 200 MG/ML IM SOLN
200.0000 mg | INTRAMUSCULAR | Status: DC
  Administered 2021-03-26: 200 mg via INTRAMUSCULAR

## 2021-03-26 NOTE — Patient Instructions (Signed)
Plan: Avoid bending, stooping and avoid lifting weights greater than 10 lbs. Avoid prolong standing and walking. Avoid frequent bending and stooping  No lifting greater than 10 lbs. May use ice or moist heat for pain. Weight loss is of benefit. 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 CT scan without contrast to assess posterior fusion at the T6-8 level.  Follow-Up Instructions: Return in about 1 week (around 04/02/2021).

## 2021-03-26 NOTE — Progress Notes (Addendum)
Pt is here for Testerone injection. Pt was given Testerone injection in right glute. Pt tolerated well.  [place] with administration.  Mackie Pai, PA-C

## 2021-03-26 NOTE — Progress Notes (Signed)
Office Visit Note   Patient: Luis Brown           Date of Birth: 12-Apr-1946           MRN: [id number] Visit Date: 03/26/2021              Requested by: Mackie Pai, PA-C [address] [place],  [place] [id number] PCP: Mackie Pai, PA-C   Assessment & Plan: Visit Diagnoses:  1. Arthrodesis status   2. Status post hardware removal   3. Fusion of spine of thoracolumbar region   4. Pain in thoracic spine   5. 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 Discitis thoracic region   8. Vertebral osteomyelitis ([place])     Plan: Avoid bending, stooping and avoid lifting weights greater than 10 lbs. Avoid prolong standing and walking. Avoid frequent bending and stooping  No lifting greater than 10 lbs. May use ice or moist heat for pain. Weight loss is of benefit. 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 CT scan without contrast to assess posterior fusion at the T6-8 level.  Follow-Up Instructions: Return in about 1 week (around 04/02/2021).   Orders:  Orders Placed This Encounter  Procedures   CT THORACIC SPINE WO CONTRAST   No orders of the defined types were placed in this encounter.     Procedures: No procedures performed   Clinical Data: No additional findings.   Subjective: Chief Complaint  Patient presents with   Spine - Pain    HPI  Review of Systems   Objective: Vital Signs: BP 138/85 (BP Location: Left Arm, Patient Position: Sitting)   Pulse 98   Ht '5\' 7"'$  (1.702 m)   Wt 157 lb (71.2 kg)   BMI 24.59 kg/m   Physical Exam  Ortho Exam  Specialty Comments:  No specialty comments available.  Imaging: No results found.   PMFS History: Patient Active Problem List   Diagnosis Date Noted   COPD (chronic obstructive pulmonary disease) ([place]) 02/10/2021   Palpitations 02/01/2021   Wears glasses 01/11/2021   Pneumothorax, right 12/15/2020   Anxiety 12/01/2020   SOB (shortness of breath)     DVT (deep venous thrombosis) ([place]) 11/16/2020   Acute respiratory failure with hypoxia ([place]) 11/14/2020   Fluid collection at surgical site    Wound infection    Sepsis ([place]) 11/11/2020   Drug-induced pneumonitis 11/10/2020   S/P bronchoscopy 09/14/2020   Hyperglycemia 03/22/2020   Gastric ulcer 01/27/2020   H/O colonoscopy with polypectomy 01/27/2020   Protein-calorie malnutrition, severe 01/09/2020   Weight loss 01/07/2020   Abdominal pain 12/30/2019   Nausea and vomiting 12/30/2019   Weight loss, abnormal 12/30/2019   Change in bowel habits 12/30/2019   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   Medication monitoring encounter 08/26/2019   Peripheral neuropathy 08/07/2019   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[place]) 04/04/2019   Fusion of spine of thoracolumbar region 03/12/2019   Status post thoracic spinal fusion 02/12/2019   Thoracic spinal stenosis 04/17/2017   Painful orthopaedic hardware ([place]) 04/17/2017   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 ([place]) 04/17/2017   Low back pain radiating to both legs 01/16/2017   Esophageal reflux 02/10/2015   Essential hypertension 06/25/2014   Asthma 06/25/2014   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   Overweight 06/25/2014   Other abnormal glucose 06/25/2014   Spinal stenosis of lumbar region without neurogenic claudication 04/09/2014   Flatback syndrome  04/09/2014   Chicken pox 10/21/2013   History of colonic polyps 10/27/2012   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   Benign enlargement of prostate 04/23/2012   ED (erectile dysfunction) 04/23/2012   Allergic state 10/25/2011   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   Diabetes mellitus without complication (HCC)    Insomnia    Fatigue    Preventative health care    DDD (degenerative disc disease), lumbosacral    Thoracic degenerative disc disease    Past Medical History:  Diagnosis Date    Acute pharyngitis 10/21/2013   Allergy    grass and pollen   Anxiety and depression 10/25/2011   BPH (benign prostatic hyperplasia) 04/23/2012   Chicken pox as a child   DDD (degenerative disc disease)    cervical responds to steroid injections and low back required surgery   DDD (degenerative disc disease), lumbosacral    Diabetes mellitus    pre   Dyspnea    ED (erectile dysfunction) 04/23/2012   Elevated BP    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   Esophageal reflux 02/10/2015   Fatigue    HTN (hypertension)    Hyperglycemia    preDM    Hyperlipidemia    Insomnia    Low back pain radiating to both legs 01/16/2017   Measles as a child   Overweight(278.02)    Peripheral neuropathy 08/07/2019   Personal history of colonic polyps 10/27/2012   Follows with [facility]   Pneumonia    " walking"   Preventative health care    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   Wears glasses     Family History  Problem Relation Age of Onset   Hypertension Mother    Diabetes Mother        type 2   Cancer Mother 70       breast in remission   Emphysema Father        smoker   COPD Father        smoker   Stroke Father 72       mini   Heart disease Father    Hypertension Sister    Hyperlipidemia Sister    Scoliosis Sister    Osteoporosis Sister    Hypertension Maternal Grandmother    Scoliosis Maternal Grandmother    Heart disease Maternal Grandfather    Heart disease Paternal Grandfather        smoker   Heart disease Daughter     Past Surgical History:  Procedure Laterality Date   BACK SURGERY  2012 and 1994   Dr Margret Chance, screws and cage in low back   BIOPSY  01/09/2020   Procedure: BIOPSY;  Surgeon: Otis Brace, MD;  Location: [facility];  Service: Gastroenterology;;   BRONCHIAL BIOPSY  11/17/2020   Procedure: BRONCHIAL BIOPSIES;  Surgeon: Laurin Coder, MD;  Location: [facility];  Service: Endoscopy;;   BRONCHIAL BRUSHINGS  11/17/2020   Procedure: BRONCHIAL BRUSHINGS;   Surgeon: Laurin Coder, MD;  Location: [facility];  Service: Endoscopy;;   BRONCHIAL WASHINGS  11/17/2020   Procedure: BRONCHIAL WASHINGS;  Surgeon: Laurin Coder, MD;  Location: [facility];  Service: Endoscopy;;   COLONOSCOPY WITH PROPOFOL N/A 01/09/2020   Procedure: COLONOSCOPY WITH PROPOFOL;  Surgeon: Otis Brace, MD;  Location: [facility];  Service: Gastroenterology;  Laterality: N/A;   ESOPHAGOGASTRODUODENOSCOPY (EGD) WITH PROPOFOL N/A 01/09/2020   Procedure: ESOPHAGOGASTRODUODENOSCOPY (EGD) WITH PROPOFOL;  Surgeon: Otis Brace, MD;  Location: [facility];  Service: Gastroenterology;  Laterality: N/A;  EYE SURGERY Bilateral    2016   HARDWARE REVISION  03/12/2019   REVISION OF HARDWARE THORACIC TEN-THORACIC ELEVEN WITH APPLICATION OF ADDITIONAL RODS AND ROD SLEEVES THORACIC EIGHT-THORACIC TWELVE 12-LUMBAR ONE (N/A )   HEMOSTASIS CONTROL  11/17/2020   Procedure: HEMOSTASIS CONTROL;  Surgeon: Laurin Coder, MD;  Location: [facility];  Service: Endoscopy;;   IR US GUIDE BX ASP/DRAIN  11/13/2020   LAMINECTOMY  02/12/2019   DECOMPRESSIVE LAMINECTOMY THORACIC NINE-THORACIC TEN AND THORACIC TEN-THORACIC ELEVEN, EXTENSION OF THORACOLUMBAR FUSION FROM THORACIC TEN TO THORACIC FIVE, LOCAL BONE GRAFT, ALLOGRAFT AND VIVIGEN (N/A)   POLYPECTOMY  01/09/2020   Procedure: POLYPECTOMY;  Surgeon: Otis Brace, MD;  Location: [facility];  Service: Gastroenterology;;   REMOVAL OF RODS AND PEDICLE SCREWS T5 AND T6, EXPLORATION OF FUSION, BIOPSY TRANSPEDICULAR T5 AND T6 (N/A )  09/14/2020   SPINAL FUSION N/A 02/12/2019   Procedure: DECOMPRESSIVE LAMINECTOMY THORACIC NINE-THORACIC TEN AND THORACIC TEN-THORACIC ELEVEN, EXTENSION OF THORACOLUMBAR FUSION FROM THORACIC TEN TO THORACIC FIVE, LOCAL BONE GRAFT, ALLOGRAFT AND VIVIGEN;  Surgeon: Jessy Oto, MD;  Location: [facility];  Service: Orthopedics;  Laterality: N/A;  DECOMPRESSIVE LAMINECTOMY THORACIC NINE-THORACIC TEN AND THORACIC  TEN-THORACIC ELEVEN, EXTENSION OF TH   SPINAL FUSION N/A 03/12/2019   Procedure: REVISION OF HARDWARE THORACIC TEN-THORACIC ELEVEN WITH APPLICATION OF ADDITIONAL RODS AND ROD SLEEVES THORACIC EIGHT-THORACIC TWELVE 12-LUMBAR ONE;  Surgeon: Jessy Oto, MD;  Location: [facility];  Service: Orthopedics;  Laterality: N/A;   TONSILLECTOMY     as child   torn rotator cuff  2010   right   VIDEO BRONCHOSCOPY N/A 11/17/2020   Procedure: VIDEO BRONCHOSCOPY WITH FLUORO;  Surgeon: Laurin Coder, MD;  Location: [facility];  Service: Endoscopy;  Laterality: N/A;   Social History   Occupational History   Not on file  Tobacco Use   Smoking status: Former    Packs/day: 1.00    Years: 20.00    Pack years: 20.00    Types: Cigarettes    Quit date: 07/18/1989    Years since quitting: 31.7   Smokeless tobacco: Never  Vaping Use   Vaping Use: Never used  Substance and Sexual Activity   Alcohol use: Not Currently   Drug use: No   Sexual activity: Yes    Comment: lives with wife, still working, no dietary restrictions, continues to exercise intermittently

## 2021-03-26 NOTE — Telephone Encounter (Signed)
Scheduled for 04/01/21 @ 9am

## 2021-03-26 NOTE — Telephone Encounter (Signed)
Pt called and needs to be worked in next week. Thank you!   CB [phone]

## 2021-03-26 NOTE — Telephone Encounter (Signed)
Patient came in to be seen

## 2021-03-29 ENCOUNTER — Ambulatory Visit (HOSPITAL_COMMUNITY): Payer: Medicare Other

## 2021-03-30 ENCOUNTER — Other Ambulatory Visit: Payer: Self-pay | Admitting: Medical

## 2021-03-31 NOTE — Telephone Encounter (Addendum)
Requesting:zolpidem Contract: 01/25/18 UDS: 01/25/18 Last Visit: 01/05/21 Next Visit: none Last Refill: 03/04/21  Please Advise  I gave patient a 14 tablet refill.  He in the past has used different hypnotic type medication such as Luis Brown stating it worked better.  Verify that he is currently happy with Ambien and have him come in to update contract and UDS.  That I will be able to give higher volumes.

## 2021-04-01 ENCOUNTER — Ambulatory Visit (INDEPENDENT_AMBULATORY_CARE_PROVIDER_SITE_OTHER): Payer: Medicare Other | Admitting: Specialist

## 2021-04-01 ENCOUNTER — Other Ambulatory Visit: Payer: Self-pay | Admitting: Medical

## 2021-04-01 ENCOUNTER — Encounter: Payer: Self-pay | Admitting: Specialist

## 2021-04-01 ENCOUNTER — Other Ambulatory Visit: Payer: Self-pay

## 2021-04-01 VITALS — BP 136/84 | HR 75 | Ht 67.0 in | Wt 157.0 lb

## 2021-04-01 DIAGNOSIS — M960 Pseudarthrosis after fusion or arthrodesis: Secondary | ICD-10-CM

## 2021-04-01 DIAGNOSIS — T84498A Other mechanical complication of other internal orthopedic devices, implants and grafts, initial encounter: Secondary | ICD-10-CM

## 2021-04-01 DIAGNOSIS — M5414 Radiculopathy, thoracic region: Secondary | ICD-10-CM | POA: Diagnosis not present

## 2021-04-01 DIAGNOSIS — M4620 Osteomyelitis of vertebra, site unspecified: Secondary | ICD-10-CM

## 2021-04-01 NOTE — Patient Instructions (Signed)
Avoid frequent bending and stooping  No lifting greater than 10 lbs. May use ice or moist heat for pain. Weight loss is of benefit. Go to [employer] and orthotics to enquire about the brace previously ordered may use as needed for mid back pain.  Exercise is important to improve your indurance and does allow people to function better inspite of back pain.  Continue with pain management.  Will follow up in 3 weeks.

## 2021-04-01 NOTE — Progress Notes (Signed)
Office Visit Note   Patient: Luis Brown           Date of Birth: 1945-11-18           MRN: [id number] Visit Date: 04/01/2021              Requested by: Mackie Pai, PA-C [address],  [place] [address] PCP: Mackie Pai, PA-C   Assessment & Plan: Visit Diagnoses:  1. Thoracic radiculopathy   2. Pseudarthrosis after fusion or arthrodesis   3. 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 ([place])   4. Vertebral osteomyelitis ([place])     Plan: Avoid frequent bending and stooping  No lifting greater than 10 lbs. May use ice or moist heat for pain. Weight loss is of benefit. Go to [employer] and orthotics to enquire about the brace previously ordered may use as needed for mid back pain.  Exercise is important to improve your indurance and does allow people to function better inspite of back pain.  Continue with pain management.  Will follow up in 3 weeks.   Follow-Up Instructions: Return in about 4 weeks (around 04/29/2021).   Orders:  No orders of the defined types were placed in this encounter.  No orders of the defined types were placed in this encounter.     Procedures: No procedures performed   Clinical Data: No additional findings.   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  He reports back spasms are Improved. He has some balance and coordination difficulties. ID was concerned about a phlegmon at the T6-7 level. Apparently the laboratory studies are improving. I considered a laminectomy as a possibility but the results of his recent CT scan suggest that if a laminectomy was done Kyphosis would likely occur unless a reinstrumentation was performs however I would not recommend reintroduction of hardware. The area of removal of hardware at T4 and T5  seems to  Have healed with the removal of the instrumentation. There MRI done shows no fluid collection and the phlegmon that is scar and enhances with contrast this suggests that surgery may not be of much help and the risk is instability and loss of function. He is clinically improving and seeing return of function.    Review of Systems  Constitutional: Negative.   HENT: Negative.    Eyes: Negative.   Respiratory: Negative.    Cardiovascular: Negative.   Gastrointestinal: Negative.   Endocrine: Negative.   Genitourinary: Negative.   Musculoskeletal: Negative.   Skin: Negative.   Allergic/Immunologic: Negative.   Neurological: Negative.   Hematological: Negative.   Psychiatric/Behavioral: Negative.      Objective: Vital Signs: BP 136/84 (BP Location: Left Arm, Patient Position: Sitting)   Pulse 75   Ht '5\' 7"'$  (1.702 m)   Wt 157 lb (71.2 kg)   BMI 24.59 kg/m   Physical Exam Constitutional:      Appearance: He is well-developed.  HENT:     Head: Normocephalic and atraumatic.  Eyes:     Pupils: Pupils are equal, round, and reactive to light.  Pulmonary:     Effort: Pulmonary effort is normal.     Breath sounds: Normal breath sounds.  Abdominal:     General: Bowel sounds are normal.  Palpations: Abdomen is soft.  Musculoskeletal:        General: Normal range of motion.     Cervical back: Normal range of motion and neck supple.     Lumbar back: Negative right straight leg raise test and negative left straight leg raise test.  Skin:    General: Skin is warm and dry.  Neurological:     Mental Status: He is alert and oriented to person, place, and time.  Psychiatric:        Behavior: Behavior normal.        Thought Content: Thought content normal.        Judgment: Judgment normal.    Back Exam   Tenderness  The patient is experiencing tenderness in the lumbar.  Muscle Strength  Right Quadriceps:  5/5  Left Quadriceps:  5/5  Right Hamstrings:  5/5  Left  Hamstrings:  5/5   Tests  Straight leg raise right: negative Straight leg raise left: negative  Other  Toe walk: abnormal Heel walk: abnormal Sensation: normal     Specialty Comments:  No specialty comments available.  Imaging: No results found.   PMFS History: Patient Active Problem List   Diagnosis Date Noted   COPD (chronic obstructive pulmonary disease) ([place]) 02/10/2021   Palpitations 02/01/2021   Wears glasses 01/11/2021   Pneumothorax, right 12/15/2020   Anxiety 12/01/2020   SOB (shortness of breath)    DVT (deep venous thrombosis) ([place]) 11/16/2020   Acute respiratory failure with hypoxia ([place]) 11/14/2020   Fluid collection at surgical site    Wound infection    Sepsis ([place]) 11/11/2020   Drug-induced pneumonitis 11/10/2020   S/P bronchoscopy 09/14/2020   Hyperglycemia 03/22/2020   Gastric ulcer 01/27/2020   H/O colonoscopy with polypectomy 01/27/2020   Protein-calorie malnutrition, severe 01/09/2020   Weight loss 01/07/2020   Abdominal pain 12/30/2019   Nausea and vomiting 12/30/2019   Weight loss, abnormal 12/30/2019   Change in bowel habits 12/30/2019   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   Medication monitoring encounter 08/26/2019   Peripheral neuropathy 08/07/2019   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[place]) 04/04/2019   Fusion of spine of thoracolumbar region 03/12/2019   Status post thoracic spinal fusion 02/12/2019   Thoracic spinal stenosis 04/17/2017   Painful orthopaedic hardware ([place]) 04/17/2017   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 ([place]) 04/17/2017   Low back pain radiating to both legs 01/16/2017   Esophageal reflux 02/10/2015   Essential hypertension 06/25/2014   Asthma 06/25/2014   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   Overweight 06/25/2014   Other abnormal glucose 06/25/2014   Spinal stenosis of lumbar region without neurogenic claudication 04/09/2014    Flatback syndrome 04/09/2014   Chicken pox 10/21/2013   History of colonic polyps 10/27/2012   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   Benign enlargement of prostate 04/23/2012   ED (erectile dysfunction) 04/23/2012   Allergic state 10/25/2011   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   Diabetes mellitus without complication (HCC)    Insomnia    Fatigue    Preventative health care    DDD (degenerative disc disease), lumbosacral    Thoracic degenerative disc disease    Past Medical History:  Diagnosis Date   Acute pharyngitis 10/21/2013   Allergy    grass and pollen   Anxiety and depression 10/25/2011   BPH (benign prostatic hyperplasia) 04/23/2012   Chicken pox as a child   DDD (degenerative disc disease)  cervical responds to steroid injections and low back required surgery   DDD (degenerative disc disease), lumbosacral    Diabetes mellitus    pre   Dyspnea    ED (erectile dysfunction) 04/23/2012   Elevated BP    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   Esophageal reflux 02/10/2015   Fatigue    HTN (hypertension)    Hyperglycemia    preDM    Hyperlipidemia    Insomnia    Low back pain radiating to both legs 01/16/2017   Measles as a child   Overweight(278.02)    Peripheral neuropathy 08/07/2019   Personal history of colonic polyps 10/27/2012   Follows with [facility]   Pneumonia    " walking"   Preventative health care    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   Wears glasses     Family History  Problem Relation Age of Onset   Hypertension Mother    Diabetes Mother        type 2   Cancer Mother 40       breast in remission   Emphysema Father        smoker   COPD Father        smoker   Stroke Father 54       mini   Heart disease Father    Hypertension Sister    Hyperlipidemia Sister    Scoliosis Sister    Osteoporosis Sister    Hypertension Maternal Grandmother    Scoliosis Maternal Grandmother    Heart disease Maternal Grandfather    Heart  disease Paternal Grandfather        smoker   Heart disease Daughter     Past Surgical History:  Procedure Laterality Date   BACK SURGERY  2012 and 1994   Dr Margret Chance, screws and cage in low back   BIOPSY  01/09/2020   Procedure: BIOPSY;  Surgeon: Otis Brace, MD;  Location: [facility];  Service: Gastroenterology;;   BRONCHIAL BIOPSY  11/17/2020   Procedure: BRONCHIAL BIOPSIES;  Surgeon: Laurin Coder, MD;  Location: [facility];  Service: Endoscopy;;   BRONCHIAL BRUSHINGS  11/17/2020   Procedure: BRONCHIAL BRUSHINGS;  Surgeon: Laurin Coder, MD;  [facility];  Service: Endoscopy;;   BRONCHIAL WASHINGS  11/17/2020   Procedure: BRONCHIAL WASHINGS;  Surgeon: Laurin Coder, MD;  [facility];  Service: Endoscopy;;   COLONOSCOPY WITH PROPOFOL N/A 01/09/2020   Procedure: COLONOSCOPY WITH PROPOFOL;  Surgeon: Otis Brace, MD;  [facility];  Service: Gastroenterology;  Laterality: N/A;   ESOPHAGOGASTRODUODENOSCOPY (EGD) WITH PROPOFOL N/A 01/09/2020   Procedure: ESOPHAGOGASTRODUODENOSCOPY (EGD) WITH PROPOFOL;  Surgeon: Otis Brace, MD;  [facility];  Service: Gastroenterology;  Laterality: N/A;   EYE SURGERY Bilateral    2016   HARDWARE REVISION  03/12/2019   REVISION OF HARDWARE THORACIC TEN-THORACIC ELEVEN WITH APPLICATION OF ADDITIONAL RODS AND ROD SLEEVES THORACIC EIGHT-THORACIC TWELVE 12-LUMBAR ONE (N/A )   HEMOSTASIS CONTROL  11/17/2020   Procedure: HEMOSTASIS CONTROL;  Surgeon: Laurin Coder, MD;  [facility];  Service: Endoscopy;;   IR US GUIDE BX ASP/DRAIN  11/13/2020   LAMINECTOMY  02/12/2019   DECOMPRESSIVE LAMINECTOMY THORACIC NINE-THORACIC TEN AND THORACIC TEN-THORACIC ELEVEN, EXTENSION OF THORACOLUMBAR FUSION FROM THORACIC TEN TO THORACIC FIVE, LOCAL BONE GRAFT, ALLOGRAFT AND VIVIGEN (N/A)   POLYPECTOMY  01/09/2020   Procedure: POLYPECTOMY;  Surgeon: Otis Brace, MD;  [facility];   Service: Gastroenterology;;   REMOVAL OF RODS AND PEDICLE SCREWS T5 AND T6,  EXPLORATION OF FUSION, BIOPSY TRANSPEDICULAR T5 AND T6 (N/A )  09/14/2020   SPINAL FUSION N/A 02/12/2019   Procedure: DECOMPRESSIVE LAMINECTOMY THORACIC NINE-THORACIC TEN AND THORACIC TEN-THORACIC ELEVEN, EXTENSION OF THORACOLUMBAR FUSION FROM THORACIC TEN TO THORACIC FIVE, LOCAL BONE GRAFT, ALLOGRAFT AND VIVIGEN;  Surgeon: Jessy Oto, MD;  Location: [facility];  Service: Orthopedics;  Laterality: N/A;  DECOMPRESSIVE LAMINECTOMY THORACIC NINE-THORACIC TEN AND THORACIC TEN-THORACIC ELEVEN, EXTENSION OF TH   SPINAL FUSION N/A 03/12/2019   Procedure: REVISION OF HARDWARE THORACIC TEN-THORACIC ELEVEN WITH APPLICATION OF ADDITIONAL RODS AND ROD SLEEVES THORACIC EIGHT-THORACIC TWELVE 12-LUMBAR ONE;  Surgeon: Jessy Oto, MD;  Location: [facility];  Service: Orthopedics;  Laterality: N/A;   TONSILLECTOMY     as child   torn rotator cuff  2010   right   VIDEO BRONCHOSCOPY N/A 11/17/2020   Procedure: VIDEO BRONCHOSCOPY WITH FLUORO;  Surgeon: Laurin Coder, MD;  [facility];  Service: Endoscopy;  Laterality: N/A;   Social History   Occupational History   Not on file  Tobacco Use   Smoking status: Former    Packs/day: 1.00    Years: 20.00    Pack years: 20.00    Types: Cigarettes    Quit date: 07/18/1989    Years since quitting: 31.7   Smokeless tobacco: Never  Vaping Use   Vaping Use: Never used  Substance and Sexual Activity   Alcohol use: Not Currently   Drug use: No   Sexual activity: Yes    Comment: lives with wife, still working, no dietary restrictions, continues to exercise intermittently

## 2021-04-05 DIAGNOSIS — M150 Primary generalized (osteo)arthritis: Secondary | ICD-10-CM | POA: Diagnosis not present

## 2021-04-05 DIAGNOSIS — M961 Postlaminectomy syndrome, not elsewhere classified: Secondary | ICD-10-CM | POA: Diagnosis not present

## 2021-04-05 DIAGNOSIS — G894 Chronic pain syndrome: Secondary | ICD-10-CM | POA: Diagnosis not present

## 2021-04-05 DIAGNOSIS — M4624 Osteomyelitis of vertebra, thoracic region: Secondary | ICD-10-CM | POA: Diagnosis not present

## 2021-04-06 ENCOUNTER — Ambulatory Visit (INDEPENDENT_AMBULATORY_CARE_PROVIDER_SITE_OTHER): Payer: Medicare Other | Admitting: Cardiology

## 2021-04-06 ENCOUNTER — Encounter: Payer: Self-pay | Admitting: Cardiology

## 2021-04-06 ENCOUNTER — Other Ambulatory Visit: Payer: Self-pay

## 2021-04-06 ENCOUNTER — Encounter: Payer: Self-pay | Admitting: Medical

## 2021-04-06 VITALS — BP 122/70 | HR 91 | Ht 70.0 in | Wt 165.4 lb

## 2021-04-06 DIAGNOSIS — Z01812 Encounter for preprocedural laboratory examination: Secondary | ICD-10-CM

## 2021-04-06 DIAGNOSIS — Z01818 Encounter for other preprocedural examination: Secondary | ICD-10-CM | POA: Diagnosis not present

## 2021-04-06 DIAGNOSIS — I480 Paroxysmal atrial fibrillation: Secondary | ICD-10-CM

## 2021-04-06 MED ORDER — METOPROLOL TARTRATE 100 MG PO TABS: ORAL_TABLET | ORAL | 0 refills | Status: DC

## 2021-04-06 NOTE — Patient Instructions (Signed)
Medication Instructions:  Your physician recommends that you continue on your current medications as directed. Please refer to the Current Medication list given to you today.  *If you need a refill on your cardiac medications before your next appointment, please call your pharmacy*   Lab Work: Pre procedure labs 05/17/2021 at [facility]:  [place]  If you have labs (blood work) drawn today and your tests are completely normal, you will receive your results only by: [place] (if you have MyChart) OR A paper copy in the mail If you have any lab test that is abnormal or we need to change your treatment, we will call you to review the results.   Testing/Procedures: Your physician has requested that you have cardiac CT within 7 days PRIOR to your ablation. Cardiac computed tomography (CT) is a painless test that uses an x-ray machine to take clear, detailed pictures of your heart.  Please follow instruction below located under "other instructions". You will get a call from our office to schedule the date for this test.  Your physician has recommended that you have an ablation. Catheter ablation is a medical procedure used to treat some cardiac arrhythmias (irregular heartbeats). During catheter ablation, a long, thin, flexible tube is put into a blood vessel in your groin (upper thigh), or neck. This tube is called an ablation catheter. It is then guided to your heart through the blood vessel. Radio frequency waves destroy small areas of heart tissue where abnormal heartbeats may cause an arrhythmia to start. Please follow instruction below located under "other instructions".   Follow-Up: At [facility], you and your health needs are our priority.  As part of our continuing mission to provide you with exceptional heart care, we have created designated Provider Care Teams.  These Care Teams include your primary Cardiologist (physician) and Advanced Practice Providers (APPs -   Physician Assistants and Nurse Practitioners) who all work together to provide you with the care you need, when you need it.   Your next appointment:   1 month(s) after your ablation  The format for your next appointment:   In Person  Provider:   AFib clinic   Thank you for choosing [facility]!!   Trinidad Curet, RN [phone]    Other Instructions   CT INSTRUCTIONS Your cardiac CT will be scheduled at one of the below locations:   [facility] [address] [place], [place] [address] [phone]  [facility] [address] [place],  [address] [phone]  If scheduled at [facility], please arrive at the [facility] main entrance (entrance A) of [facility] 30 minutes prior to test start time. Proceed to the [facility] Department (first floor) to check-in and test prep.  If scheduled at [facility], please arrive 15 mins early for check-in and test prep.  Please follow these instructions carefully (unless otherwise directed):  Hold all erectile dysfunction medications at least 3 days (72 hrs) prior to test.  On the Night Before the Test: Be sure to Drink plenty of water. Do not consume any caffeinated/decaffeinated beverages or chocolate 12 hours prior to your test. Do not take any antihistamines 12 hours prior to your test.  On the Day of the Test: Drink plenty of water until 1 hour prior to the test. Do not eat any food 4 hours prior to the test. You may take your regular  medications prior to the test.  Take metoprolol (Lopressor) 100 mg two hours prior to test. HOLD Furosemide/Hydrochlorothiazide morning of the test.      After the Test: Drink plenty of water. After receiving IV contrast, you may experience a mild flushed feeling. This is normal. On occasion, you may experience a mild rash up to 24 hours after the test.  This is not dangerous. If this occurs, you can take Benadryl 25 mg and increase your fluid intake. If you experience trouble breathing, this can be serious. If it is severe call 911 IMMEDIATELY. If it is mild, please call our office. If you take any of these medications: Glipizide/Metformin, Avandament, Glucavance, please do not take 48 hours after completing test unless otherwise instructed.   Once we have confirmed authorization from your insurance company, we will call you to set up a date and time for your test. Based on how quickly your insurance processes prior authorizations requests, please allow up to 4 weeks to be contacted for scheduling your Cardiac CT appointment. Be advised that routine Cardiac CT appointments could be scheduled as many as 8 weeks after your provider has ordered it.  For non-scheduling related questions, please contact the cardiac imaging nurse navigator should you have any questions/concerns: Marchia Bond, Cardiac Imaging Nurse Navigator Gordy Clement, Cardiac Imaging Nurse Navigator [facility] Direct Office Dial: [phone]   For scheduling needs, including cancellations and rescheduling, please call [place], [phone].     Electrophysiology/Ablation Procedure Instructions   You are scheduled for a(n)  ablation on 05/28/2021 with Dr. Allegra Lai.   1.   Pre procedure testing-             A.  LAB WORK --- On 05/17/2021 for your pre procedure blood work.  You can stop by the [facility] office for this lab work.  You do NOT need to be fasting.   On the day of your procedure 05/28/2021 you will go to [facility] [phone] [address]) at 5:30 am.  Dennis Bast will go to the main entrance A [employer]) and enter where the [employer] are.  Your driver will drop you off and you will head down the hallway to ADMITTING.  You may have one support person come in to the hospital with you.  They will be asked to wait in the  waiting room. It is OK to have someone drop you off and come back when you are ready to be discharged.   3.   Do not eat or drink after midnight prior to your procedure.   4.   On the morning of your procedure do NOT take any medication. Do not miss any doses of your blood thinner prior to the morning of your procedure or your procedure will need to be rescheduled.   5.  Plan for an overnight stay but you may be discharged after your procedure, if you use your phone frequently bring your phone charger. If you are discharged after your procedure you will need someone to drive you home and be with you for 24 hours after your procedure.   6. You will follow up with the AFIB clinic 4 weeks after your procedure.  You will follow up with Dr. Curt Bears  3 months after your procedure.  These appointments will be made for you.   7. FYI: For your safety, and to allow [place] to monitor your vital signs accurately during the surgery/procedure we request that if  you have artificial nails, gel coating, SNS etc. Please have those removed prior to your surgery/procedure. Not having the nail coverings /polish removed may result in cancellation or delay of your surgery/procedure.  * If you have ANY questions please call the office [phone] and ask for Josephine Rudnick RN or send me a MyChart message   * Occasionally, EP Studies and ablations can become lengthy.  Please make your family aware of this before your procedure starts.  Average time ranges from 2-8 hours for EP studies/ablations.  Your physician will call your family after the procedure with the results.                                   Cardiac Ablation Cardiac ablation is a procedure to destroy (ablate) some heart tissue that is sending bad signals. These bad signals cause problems in heart rhythm. The heart has many areas that make these signals. If there are problems in these areas, they can make the heart beat in a way that is not normal. Destroying some  tissues can help make the heart rhythm normal. Tell your doctor about: Any allergies you have. All medicines you are taking. These include vitamins, herbs, eye drops, creams, and over-the-counter medicines. Any problems you or family members have had with medicines that make you fall asleep (anesthetics). Any blood disorders you have. Any surgeries you have had. Any medical conditions you have, such as kidney failure. Whether you are pregnant or may be pregnant. What are the risks? This is a safe procedure. But problems may occur, including: Infection. Bruising and bleeding. Bleeding into the chest. Stroke or blood clots. Damage to nearby areas of your body. Allergies to medicines or dyes. The need for a pacemaker if the normal system is damaged. Failure of the procedure to treat the problem. What happens before the procedure? Medicines Ask your doctor about: Changing or stopping your normal medicines. This is important. Taking aspirin and ibuprofen. Do not take these medicines unless your doctor tells you to take them. Taking other medicines, vitamins, herbs, and supplements. General instructions Follow instructions from your doctor about what you cannot eat or drink. Plan to have someone take you home from the hospital or clinic. If you will be going home right after the procedure, plan to have someone with you for 24 hours. Ask your doctor what steps will be taken to prevent infection. What happens during the procedure?  An IV tube will be put into one of your veins. You will be given a medicine to help you relax. The skin on your neck or groin will be numbed. A cut (incision) will be made in your neck or groin. A needle will be put through your cut and into a large vein. A tube (catheter) will be put into the needle. The tube will be moved to your heart. Dye may be put through the tube. This helps your doctor see your heart. Small devices (electrodes) on the tube will send  out signals. A type of energy will be used to destroy some heart tissue. The tube will be taken out. Pressure will be held on your cut. This helps stop bleeding. A bandage will be put over your cut. The exact procedure may vary among doctors and hospitals. What happens after the procedure? You will be watched until you leave the hospital or clinic. This includes checking your heart rate, breathing rate, oxygen, and blood  pressure. Your cut will be watched for bleeding. You will need to lie still for a few hours. Do not drive for 24 hours or as long as your doctor tells you. Summary Cardiac ablation is a procedure to destroy some heart tissue. This is done to treat heart rhythm problems. Tell your doctor about any medical conditions you may have. Tell him or her about all medicines you are taking to treat them. This is a safe procedure. But problems may occur. These include infection, bruising, bleeding, and damage to nearby areas of your body. Follow what your doctor tells you about food and drink. You may also be told to change or stop some of your medicines. After the procedure, do not drive for 24 hours or as long as your doctor tells you. This information is not intended to replace advice given to you by your health care provider. Make sure you discuss any questions you have with your health care provider. Document Revised: 06/06/2019 Document Reviewed: 06/06/2019 Elsevier Patient Education  2022 [employer].

## 2021-04-06 NOTE — Progress Notes (Signed)
Electrophysiology Office Note   Date:  04/06/2021   ID:  Hargis, Vandyne [birth year], MRN [id number]  PCP:  Mackie Pai, PA-C  Cardiologist:  Harriet Masson Primary Electrophysiologist:  Ona Rathert Meredith Leeds, MD    Chief Complaint: AF   History of Present Illness: Luis Brown is a 75 y.o. male who is being seen today for the evaluation of AF at the request of Tobb, Kardie, DO. Presenting today for electrophysiology evaluation.  He has a history significant for atrial fibrillation, hypertension, diabetes, hyperlipidemia.  He has had multiple episodes of atrial fibrillation with rapid rates.  He feels quite poorly when he is in atrial fibrillation.  He wore a ZIO monitor that showed 3% atrial fibrillation burden.  Today, he denies symptoms of palpitations, chest pain, shortness of breath, orthopnea, PND, lower extremity edema, claudication, dizziness, presyncope, syncope, bleeding, or neurologic sequela. The patient is tolerating medications without difficulties.  He feels well today.  When he is in atrial fibrillation, he has significant weakness, fatigue, and shortness of breath.  His heart rates are quite fast when he is in atrial fibrillation is.  He is currently on digoxin.  Since he has been treated for atrial fibrillation, he is felt improved.  He notes no exacerbating or alleviating factors contributing to his atrial fibrillation.  He is mainly limited from back pain.   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   Esophageal reflux 02/10/2015    Fatigue    HTN (hypertension)    Hyperglycemia    preDM    Hyperlipidemia    Insomnia    Low back pain radiating to both legs 01/16/2017   Measles as a child   Overweight(278.02)    Peripheral neuropathy 08/07/2019   Personal history of colonic polyps 10/27/2012   Follows with [facility]   Pneumonia    " walking"   Preventative health care    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   Wears glasses    Past Surgical History:  Procedure Laterality Date   BACK SURGERY  2012 and 1994   Dr Margret Chance, screws and cage in low back   BIOPSY  01/09/2020   Procedure: BIOPSY;  Surgeon: Otis Brace, MD;  Location: [facility];  Service: Gastroenterology;;   BRONCHIAL BIOPSY  11/17/2020   Procedure: BRONCHIAL BIOPSIES;  Surgeon: Laurin Coder, MD;  Location: [facility];  Service: Endoscopy;;   BRONCHIAL BRUSHINGS  11/17/2020   Procedure: BRONCHIAL BRUSHINGS;  Surgeon: Laurin Coder, MD;  [facility]: [facility];  Service: Endoscopy;;   BRONCHIAL WASHINGS  11/17/2020   Procedure: BRONCHIAL WASHINGS;  Surgeon: Laurin Coder, MD;  [facility]: [facility];  Service: Endoscopy;;   COLONOSCOPY WITH PROPOFOL N/A 01/09/2020   Procedure: COLONOSCOPY WITH PROPOFOL;  Surgeon: Otis Brace, MD;  [facility]: [facility];  Service: Gastroenterology;  Laterality: N/A;   ESOPHAGOGASTRODUODENOSCOPY (EGD) WITH PROPOFOL N/A 01/09/2020   Procedure: ESOPHAGOGASTRODUODENOSCOPY (EGD) WITH PROPOFOL;  Surgeon: Otis Brace, MD;  [facility];  Service: Gastroenterology;  Laterality: N/A;   EYE SURGERY Bilateral    2016   HARDWARE REVISION  03/12/2019   REVISION OF HARDWARE THORACIC TEN-THORACIC ELEVEN WITH APPLICATION OF ADDITIONAL RODS AND ROD SLEEVES THORACIC EIGHT-THORACIC TWELVE 12-LUMBAR ONE (N/A )   HEMOSTASIS CONTROL  11/17/2020   Procedure: HEMOSTASIS CONTROL;  Surgeon: Laurin Coder, MD;  [facility];  Service: Endoscopy;;   IR US GUIDE BX ASP/DRAIN  11/13/2020    LAMINECTOMY  02/12/2019   DECOMPRESSIVE LAMINECTOMY THORACIC NINE-THORACIC TEN AND THORACIC TEN-THORACIC ELEVEN, EXTENSION OF THORACOLUMBAR FUSION FROM THORACIC TEN TO THORACIC FIVE, LOCAL BONE GRAFT, ALLOGRAFT AND VIVIGEN (N/A)   POLYPECTOMY  01/09/2020   Procedure: POLYPECTOMY;  Surgeon: Otis Brace, MD;  [facility]: [facility];  Service: Gastroenterology;;   REMOVAL OF RODS AND PEDICLE SCREWS T5 AND T6, EXPLORATION OF FUSION, BIOPSY TRANSPEDICULAR T5 AND T6 (N/A )  09/14/2020   SPINAL FUSION N/A 02/12/2019   Procedure: DECOMPRESSIVE LAMINECTOMY THORACIC NINE-THORACIC TEN AND THORACIC TEN-THORACIC ELEVEN, EXTENSION OF THORACOLUMBAR FUSION FROM THORACIC TEN TO THORACIC FIVE, LOCAL BONE GRAFT, ALLOGRAFT AND VIVIGEN;  Surgeon: Jessy Oto, MD;  Location: [facility];  Service: Orthopedics;  Laterality: N/A;  DECOMPRESSIVE LAMINECTOMY THORACIC NINE-THORACIC TEN AND THORACIC TEN-THORACIC ELEVEN, EXTENSION OF TH   SPINAL FUSION N/A 03/12/2019   Procedure: REVISION OF HARDWARE THORACIC TEN-THORACIC ELEVEN WITH APPLICATION OF ADDITIONAL RODS AND ROD SLEEVES THORACIC EIGHT-THORACIC TWELVE 12-LUMBAR ONE;  Surgeon: Jessy Oto, MD;  Location: [facility];  Service: Orthopedics;  Laterality: N/A;   TONSILLECTOMY     as child   torn rotator cuff  2010   right   VIDEO BRONCHOSCOPY N/A 11/17/2020   Procedure: VIDEO BRONCHOSCOPY WITH FLUORO;  Surgeon: Laurin Coder, MD;  [facility];  Service: Endoscopy;  Laterality: N/A;     Current Outpatient Medications  Medication Sig Dispense Refill   atorvastatin (LIPITOR) 10 MG tablet TAKE 1 TABLET BY MOUTH EVERY DAY 30 tablet 0   digoxin (LANOXIN) 0.125 MG tablet Take 1 tablet (0.125 mg total) by mouth daily. 90 tablet 3   ferrous fumarate (FERRETTS) 325 (106 Fe) MG TABS tablet Take 1 tablet (106 mg of iron total) by mouth daily. 90 tablet 3   glucose blood (CONTOUR NEXT TEST) test strip Check blood sugar once daily 100 each 12   JANUVIA 50 MG tablet  TAKE 1 TABLET BY MOUTH EVERY DAY 30 tablet 3   metFORMIN (GLUCOPHAGE) 1000 MG tablet Take 1 tablet (1,000 mg total) by mouth 2 (two) times daily with a meal. 180 tablet 0   metoprolol tartrate (LOPRESSOR) 100 MG tablet Take 1 tablet (100 mg total) two hours prior to CT testing 1 tablet 0   pantoprazole (PROTONIX) 40 MG tablet TAKE 1 TABLET BY MOUTH TWICE A DAY BEFORE A MEAL 180 tablet 1   pregabalin (LYRICA) 75 MG capsule TAKE 1 CAPSULE BY MOUTH TWICE A DAY 60 capsule 3   PROVENTIL HFA 108 (90 Base) MCG/ACT inhaler TAKE 2 PUFFS BY MOUTH EVERY 6 HOURS AS NEEDED FOR WHEEZE OR SHORTNESS OF BREATH 6.7 each 5   rivaroxaban (XARELTO) 20 MG TABS tablet Take 1 tablet (20 mg total) by mouth daily with supper. 90 tablet 3   sucralfate (CARAFATE) 1 g tablet TAKE 1 TABLET (1 G TOTAL) BY MOUTH 4 (FOUR) TIMES DAILY - WITH MEALS AND AT BEDTIME. 120 tablet 1   tamsulosin (FLOMAX) 0.4 MG CAPS capsule Take 2 capsules (0.8 mg total) by mouth daily. 180 capsule 3  Tedizolid Phosphate 200 MG TABS Take 1 tablet by mouth daily. 30 tablet 1   testosterone cypionate (DEPOTESTOSTERONE CYPIONATE) 200 MG/ML injection INJECT 1 ML (200 MG TOTAL) INTO THE MUSCLE EVERY 14 (FOURTEEN) DAYS. 2 mL 0   Tiotropium Bromide-Olodaterol (STIOLTO RESPIMAT) 2.5-2.5 MCG/ACT AERS Inhale 2 puffs into the lungs daily. 4 g 0   Tiotropium Bromide-Olodaterol (STIOLTO RESPIMAT) 2.5-2.5 MCG/ACT AERS Inhale 2 puffs into the lungs daily. 1 each 3   tiZANidine (ZANAFLEX) 4 MG tablet Take 1.5 tablets (6 mg total) by mouth every 6 (six) hours as needed for muscle spasms. 540 tablet 1   venlafaxine XR (EFFEXOR-XR) 150 MG 24 hr capsule TAKE 1 CAPSULE BY MOUTH DAILY WITH BREAKFAST. 90 capsule 1   zolpidem (AMBIEN) 5 MG tablet TAKE 1 TABLET BY MOUTH AT BEDTIME AS NEEDED FOR SLEEP. 14 tablet 0   Current Facility-Administered Medications  Medication Dose Route Frequency Provider Last Rate Last Admin   testosterone cypionate (DEPOTESTOSTERONE CYPIONATE)  injection 200 mg  200 mg Intramuscular Q14 Days Saguier, Percell Miller, PA-C   200 mg at 01/14/21 1026   testosterone cypionate (DEPOTESTOSTERONE CYPIONATE) injection 200 mg  200 mg Intramuscular Q14 Days Saguier, Percell Miller, PA-C   200 mg at 02/10/21 1004   testosterone cypionate (DEPOTESTOSTERONE CYPIONATE) injection 200 mg  200 mg Intramuscular Q14 Days Saguier, Percell Miller, PA-C   200 mg at 02/26/21 1106   testosterone cypionate (DEPOTESTOSTERONE CYPIONATE) injection 200 mg  200 mg Intramuscular Q14 Days Saguier, Percell Miller, PA-C   200 mg at 03/26/21 1409    Allergies:   Daptomycin   Social History:  The patient  reports that he quit smoking about 31 years ago. His smoking use included cigarettes. He has a 20.00 pack-year smoking history. He has never used smokeless tobacco. He reports that he does not currently use alcohol. He reports that he does not use drugs.   Family History:  The patient's family history includes COPD in his father; Cancer (age of onset: 47) in his mother; Diabetes in his mother; Emphysema in his father; Heart disease in his daughter, father, maternal grandfather, and paternal grandfather; Hyperlipidemia in his sister; Hypertension in his maternal grandmother, mother, and sister; Osteoporosis in his sister; Scoliosis in his maternal grandmother and sister; Stroke (age of onset: 31) in his father.    ROS:  Please see the history of present illness.   Otherwise, review of systems is positive for none.   All other systems are reviewed and negative.    PHYSICAL EXAM: VS:  BP 122/70   Pulse 91   Ht 5\' 10"  (1.778 m)   Wt 165 lb 6.4 oz (75 kg)   SpO2 97%   BMI 23.73 kg/m  , BMI Body mass index is 23.73 kg/m. GEN: Well nourished, well developed, in no acute distress  HEENT: normal  Neck: no JVD, carotid bruits, or masses Cardiac: RRR; no murmurs, rubs, or gallops,no edema  Respiratory:  clear to auscultation bilaterally, normal work of breathing GI: soft, nontender, nondistended, +  BS MS: no deformity or atrophy  Skin: warm and dry Neuro:  Strength and sensation are intact Psych: euthymic mood, full affect  EKG:  EKG is ordered today. Personal review of the ekg ordered shows sinus rhythm, rate 91  Recent Labs: 11/10/2020: B Natriuretic Peptide 68.6 12/31/2020: TSH 2.36 02/01/2021: Magnesium 1.6 03/15/2021: ALT 7; BUN 12; Creat 0.66; Hemoglobin 14.0; Platelets 397; Potassium 4.0; Sodium 136    Lipid Panel     Component Value Date/Time  CHOL 112 07/01/2020 0857   TRIG 66.0 07/01/2020 0857   HDL 56.10 07/01/2020 0857   CHOLHDL 2 07/01/2020 0857   VLDL 13.2 07/01/2020 0857   LDLCALC 42 07/01/2020 0857   LDLDIRECT 64.0 10/30/2017 1502     Wt Readings from Last 3 Encounters:  04/06/21 165 lb 6.4 oz (75 kg)  04/01/21 157 lb (71.2 kg)  03/26/21 157 lb (71.2 kg)      Other studies Reviewed: Additional studies/ records that were reviewed today include: TTE 11/11/20  Review of the above records today demonstrates:   1. Left ventricular ejection fraction, by estimation, is 60 to 65%. The  left ventricle has normal function. The left ventricle has no regional  wall motion abnormalities. Left ventricular diastolic parameters were  normal.   2. Right ventricular systolic function is normal. The right ventricular  size is mildly enlarged.   3. The mitral valve is normal in structure. No evidence of mitral valve  regurgitation.   4. The aortic valve was not well visualized. Aortic valve regurgitation  is not visualized. No aortic stenosis is present.   5. The inferior vena cava is normal in size with greater than 50%  respiratory variability, suggesting right atrial pressure of 3 mmHg.   Cardiac monitor 02/16/2021 personally reviewed paroxysmal atrial fibrillation and occasional premature atrial complexes.  ASSESSMENT AND PLAN:  1.  Paroxysmal atrial fibrillation: Currently on Xarelto 20 mg daily.  CHA2DS2-VASc of 3.  He would like a rhythm control strategy as  he does feel poorly when he is in atrial fibrillation.  He is on quite a few medications and would like to try to get off of some of them.  Due to that, he would like to try to avoid antiarrhythmics.  We Ahmiyah Coil plan for atrial fibrillation ablation.  Risk and benefits of been discussed which he understands.  The patient and his wife both agreed to ablation.  Risk, benefits, and alternatives to EP study and radiofrequency ablation for afib were also discussed in detail today. These risks include but are not limited to stroke, bleeding, vascular damage, tamponade, perforation, damage to the esophagus, lungs, and other structures, pulmonary vein stenosis, worsening renal function, and death. The patient understands these risk and wishes to proceed.  We Ashad Fawbush therefore proceed with catheter ablation at the next available time.  Carto, ICE, anesthesia are requested for the procedure.  Alexcis Bicking also obtain CT PV protocol prior to the procedure to exclude LAA thrombus and further evaluate atrial anatomy.   2.  Hypertension: Currently well controlled  Case discussed with primary cardiology    Current medicines are reviewed at length with the patient today.   The patient does not have concerns regarding his medicines.  The following changes were made today:  none  Labs/ tests ordered today include:  Orders Placed This Encounter  Procedures   CT CARDIAC MORPH/PULM VEIN W/CM&W/O CA SCORE   CBC   Basic metabolic panel      Disposition:   FU with Sundai Probert 3 months  Signed, Patsey Pitstick Meredith Leeds, MD  04/06/2021 11:51 AM     [address] [place] [id number] [phone] (office) [phone] (fax)

## 2021-04-07 ENCOUNTER — Other Ambulatory Visit: Payer: Self-pay

## 2021-04-07 ENCOUNTER — Other Ambulatory Visit (HOSPITAL_COMMUNITY): Payer: Self-pay

## 2021-04-07 ENCOUNTER — Ambulatory Visit (INDEPENDENT_AMBULATORY_CARE_PROVIDER_SITE_OTHER): Payer: Medicare Other | Admitting: Internal Medicine

## 2021-04-07 ENCOUNTER — Encounter: Payer: Self-pay | Admitting: Internal Medicine

## 2021-04-07 VITALS — BP 145/78 | HR 82 | Temp 98.2°F | Wt 163.8 lb

## 2021-04-07 DIAGNOSIS — L089 Local infection of the skin and subcutaneous tissue, unspecified: Secondary | ICD-10-CM

## 2021-04-07 DIAGNOSIS — M8660 Other chronic osteomyelitis, unspecified site: Secondary | ICD-10-CM

## 2021-04-07 DIAGNOSIS — T148XXA Other injury of unspecified body region, initial encounter: Secondary | ICD-10-CM | POA: Diagnosis not present

## 2021-04-07 DIAGNOSIS — M4624 Osteomyelitis of vertebra, thoracic region: Secondary | ICD-10-CM | POA: Diagnosis not present

## 2021-04-07 MED ORDER — TEDIZOLID PHOSPHATE 200 MG PO TABS
200.0000 mg | ORAL_TABLET | Freq: Every day | ORAL | 1 refills | Status: DC
Start: 2021-04-07 — End: 2021-06-23
  Filled 2021-04-07: qty 30, 30d supply, fill #0

## 2021-04-07 NOTE — Progress Notes (Signed)
[facility] Infect[facility] Disease  CHIEF COMPLAINT:    Follow up for back infection  SUBJECTIVE:    Luis Brown is a 75 y.o. male with PMHx as below who presents to the clinic for back infection.   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  We saw him last on 03/17/21 at which point he had completed 10 days of Linezolid followed by Tedizolid [facility] 03/15/21) which he has been on for about 4 weeks.  He continues to be limited by pain.  His inflammatory markers were improved when last checked.  He has been seeing Dr Louanne Skye regularly and had a CT scan to look for any loosening hardware after review of his recent MRI.  There was lucency about the T7-9 screws which may reflect loosening or infection, however, he does not recommend surgery as the risk of instability and loss of function is high.  The phlegmon that was noted was felt to be scar and not concerned for abscess.  8/29 his CRP was 9.7 and ESR normalized.   Please see A&P for the details of today's visit and status of the patient's medical problems.   Patient's Medications  New Prescriptions   No medications on file  Previous Medications   ATORVASTATIN (LIPITOR) 10 MG TABLET    TAKE 1 TABLET BY MOUTH EVERY DAY   DIGOXIN (LANOXIN) 0.125 MG TABLET    Take 1 tablet (0.125 mg total) by mouth daily.   FERROUS FUMARATE (FERRETTS) 325 (106 FE) MG TABS TABLET    Take 1 tablet (106 mg of iron total) by mouth daily.   GLUCOSE BLOOD (CONTOUR NEXT TEST) TEST STRIP    Check blood sugar once daily   JANUVIA 50 MG TABLET    TAKE 1 TABLET BY MOUTH EVERY DAY   METFORMIN (GLUCOPHAGE) 1000 MG TABLET    Take 1 tablet (1,000 mg total) by mouth 2 (two) times daily with a meal.   METOPROLOL TARTRATE (LOPRESSOR) 100 MG TABLET    Take 1 tablet (100 mg total) two hours prior to CT testing   PANTOPRAZOLE (PROTONIX) 40 MG TABLET    TAKE 1 TABLET BY MOUTH TWICE A DAY BEFORE A MEAL   PREGABALIN (LYRICA) 75 MG CAPSULE     TAKE 1 CAPSULE BY MOUTH TWICE A DAY   PROVENTIL HFA 108 (90 BASE) MCG/ACT INHALER    TAKE 2 PUFFS BY MOUTH EVERY 6 HOURS AS NEEDED FOR WHEEZE OR SHORTNESS OF BREATH   RIVAROXABAN (XARELTO) 20 MG TABS TABLET    Take 1 tablet (20 mg total) by mouth daily with supper.   SUCRALFATE (CARAFATE) 1 G TABLET    TAKE 1 TABLET (1 G TOTAL) BY MOUTH 4 (FOUR) TIMES DAILY - WITH MEALS AND AT BEDTIME.   TAMSULOSIN (FLOMAX) 0.4 MG CAPS CAPSULE    Take 2 capsules (0.8 mg total) by mouth daily.   TESTOSTERONE CYPIONATE (DEPOTESTOSTERONE CYPIONATE) 200 MG/ML INJECTION    INJECT 1 ML (200 MG TOTAL) INTO THE MUSCLE EVERY 14 (FOURTEEN) DAYS.   TIOTROPIUM BROMIDE-OLODATEROL (STIOLTO RESPIMAT) 2.5-2.5 MCG/ACT AERS    Inhale 2 puffs into the lungs daily.   TIOTROPIUM BROMIDE-OLODATEROL (STIOLTO RESPIMAT) 2.5-2.5 MCG/ACT AERS    Inhale 2 puffs into the lungs daily.   TIZANIDINE (ZANAFLEX) 4 MG TABLET    Take 1.5 tablets (6 mg total) by mouth every 6 (six) hours as needed for muscle spasms.   VENLAFAXINE XR (EFFEXOR-XR)  150 MG 24 HR CAPSULE    TAKE 1 CAPSULE BY MOUTH DAILY WITH BREAKFAST.   ZOLPIDEM (AMBIEN) 5 MG TABLET    TAKE 1 TABLET BY MOUTH AT BEDTIME AS NEEDED FOR SLEEP.  Modified Medications   Modified Medication Previous Medication   TEDIZOLID PHOSPHATE 200 MG TABS Tedizolid Phosphate 200 MG TABS      Take 1 tablet by mouth daily.    Take 1 tablet by mouth daily.  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   Esophageal  reflux 02/10/2015   Fatigue    HTN (hypertension)    Hyperglycemia    preDM    Hyperlipidemia    Insomnia    Low back pain radiating to both legs 01/16/2017   Measles as a child   Overweight(278.02)    Peripheral neuropathy 08/07/2019   Personal history of colonic polyps 10/27/2012   Follows with [facility]   Pneumonia    " walking"   Preventative health care    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   Wears glasses     Social History   Tobacco Use   Smoking status: Former    Packs/day: 1.00    Years: 20.00    Pack years: 20.00    Types: Cigarettes    Quit date: 07/18/1989    Years since quitting: 31.7   Smokeless tobacco: Never  Vaping Use   Vaping Use: Never used  Substance Use Topics   Alcohol use: Not Currently   Drug use: No    Family History  Problem Relation Age of Onset   Hypertension Mother    Diabetes Mother        type 2   Cancer Mother 45       breast in remission   Emphysema Father        smoker   COPD Father        smoker   Stroke Father 75       mini   Heart disease Father    Hypertension Sister    Hyperlipidemia Sister    Scoliosis Sister    Osteoporosis Sister    Hypertension Maternal Grandmother    Scoliosis Maternal Grandmother    Heart disease Maternal Grandfather    Heart disease Paternal Grandfather        smoker   Heart disease Daughter     Allergies  Allergen Reactions   Daptomycin     Drug induced pneumonia     Review of Systems  Constitutional: Negative.   Musculoskeletal:  Positive for back pain.    OBJECTIVE:    Vitals:   04/07/21 1517  BP: (!) 145/78  Pulse: 82  Temp: 98.2 F (36.8 C)  TempSrc: Oral  SpO2: 96%  Weight: 163 lb 12.8 oz (74.3 kg)   Body mass index is 23.5 kg/m.  Physical Exam Constitutional:      General: He is not in acute distress.  Appearance: Normal appearance.  HENT:     Head: Normocephalic and atraumatic.  Pulmonary:     Effort: Pulmonary effort is normal. No respiratory  distress.  Musculoskeletal:        General: Normal range of motion.  Skin:    General: Skin is warm and dry.     Findings: No rash.  Neurological:     General: No focal deficit present.     Mental Status: He is alert and oriented to person, place, and time.  Psychiatric:        Mood and Affect: Mood normal.        Behavior: Behavior normal.     Labs and Microbiology: CBC Latest Ref Rng & Units 03/15/2021 01/07/2021 01/05/2021  WBC 3.8 - 10.8 Thousand/uL 7.5 8.9 7.7  Hemoglobin 13.2 - 17.1 g/dL 14.0 11.8(L) 11.1(L)  Hematocrit 38.5 - 50.0 % 43.5 35.4(L) 33.3(L)  Platelets 140 - 400 Thousand/uL 397 450.0(H) 367.0   CMP Latest Ref Rng & Units 03/15/2021 02/01/2021 01/05/2021  Glucose 65 - 99 mg/dL 96 115(H) 187(H)  BUN 7 - 25 mg/dL _0 Creatinine 0.70 - 1.28 mg/dL 0.66(L) 0.63(L) 0.56  Sodium 135 - 146 mmol/L 136 138 134(L)  Potassium 3.5 - 5.3 mmol/L 4.0 4.4 4.5  Chloride 98 - 110 mmol/L 96(L) 98 95(L)  CO2 20 - 32 mmol/L _1 Calcium 8.6 - 10.3 mg/dL 9.6 9.5 9.3  Total Protein 6.1 - 8.1 g/dL 6.5 - 5.9(L)  Total Bilirubin 0.2 - 1.2 mg/dL 0.5 - 0.9  Alkaline Phos 39 - 117 U/L - - 65  AST 10 - 35 U/L 14 - 15  ALT 9 - 46 U/L 7(L) - 21     No results found for this or any previous visit (from the past 240 hour(s)).  Imaging: IMPRESSION: 1. Findings of T6-7 discitis-osteomyelitis with regional paravertebral inflammation. Epidural phlegmon and associated spinal stenosis were better evaluated on the recent MRI. 2. Extensive posterior thoracolumbar spinal fusion with lucency about the T7-T9 screws (particularly bilateral T7 and right T8 screws) which may reflect loosening or infection. 3. T4-5 and T9-10 interbody ankylosis corresponding to prior sites of discitis-osteomyelitis. 4. Emphysema (ICD10-J43.9).   ASSESSMENT & PLAN:    1. 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HCC)  Continues to have thoracic back pain and currently on antibiotics.  Recent CT showed  hardware loosening at T7-9 that may reflect infection but not definitive and currently no plans for surgical intervention from his primary orthopedic surgeon.  He is interested in 2nd opinion from [facility] to see if further intervention would be recommended.    Inflammatory markers were improved when last checked and will repeat again today.  Discussed with pharmacy regarding antibiotic plan and will have him continue Tedizolid for another 4 weeks (thus will have done 8 weeks total + 10 days linezolid) and hopefully arrange for dalbavancin infusion every 2 weeks as a suppressive regimen.  Will also place NSGY referral for him to receive 2nd opinion that he would like.  RTC in 8 weeks.   Orders Placed This Encounter  Procedures   Sedimentation rate   C-reactive protein   CBC w/Diff   Ambulatory referral to Neurosurgery    Referral Priority:   Routine    Referral Type:   Surgical    Referral Reason:   Specialty Services Required    Requested Specialty:   Neurosurgery    Number of Visits Requested:   1  [facility] 04/07/2021, 4:07 PM   I spent 40 minutes dedicated to the care of this patient on the date of this encounter to include pre-visit review of records, face-to-face time with the patient discussing thoracic spine infection and post-visit ordering of testing.

## 2021-04-07 NOTE — Patient Instructions (Signed)
Thank you for coming to see me today. It was a pleasure seeing you.  To Do: Labs today Follow up in 8 weeks I placed referral to neurosurgery for 2nd opinion Continue Tedizolid for another 4 weeks.  I'll send refill to pharmacy and we will look into long acting injectable antibiotic for suppression of infection  If you have any questions or concerns, please do not hesitate to call the office at [phone].  Take Care,   Jule Ser, DO

## 2021-04-07 NOTE — Addendum Note (Signed)
Addended by: Glean Salen on: 04/07/2021 03:10 PM   Modules accepted: Orders

## 2021-04-08 ENCOUNTER — Telehealth: Payer: Self-pay

## 2021-04-08 ENCOUNTER — Other Ambulatory Visit (HOSPITAL_COMMUNITY): Payer: Self-pay

## 2021-04-08 DIAGNOSIS — M4844XA Fatigue fracture of vertebra, thoracic region, initial encounter for fracture: Secondary | ICD-10-CM | POA: Diagnosis not present

## 2021-04-08 LAB — CBC WITH DIFFERENTIAL/PLATELET
Absolute Monocytes: 808 cells/uL (ref 200–950)
Basophils Absolute: 34 cells/uL (ref 0–200)
Basophils Relative: 0.4 %
Eosinophils Absolute: 68 cells/uL (ref 15–500)
Eosinophils Relative: 0.8 %
HCT: 41.9 % (ref 38.5–50.0)
Hemoglobin: 13.7 g/dL (ref 13.2–17.1)
Lymphs Abs: 612 cells/uL — ABNORMAL LOW (ref 850–3900)
MCH: 29.3 pg (ref 27.0–33.0)
MCHC: 32.7 g/dL (ref 32.0–36.0)
MCV: 89.7 fL (ref 80.0–100.0)
MPV: 8.9 fL (ref 7.5–12.5)
Monocytes Relative: 9.5 %
Neutro Abs: 6979 cells/uL (ref 1500–7800)
Neutrophils Relative %: 82.1 %
Platelets: 341 10*3/uL (ref 140–400)
RBC: 4.67 10*6/uL (ref 4.20–5.80)
RDW: 14.8 % (ref 11.0–15.0)
Total Lymphocyte: 7.2 %
WBC: 8.5 10*3/uL (ref 3.8–10.8)

## 2021-04-08 LAB — SEDIMENTATION RATE: Sed Rate: 6 mm/h (ref 0–20)

## 2021-04-08 LAB — C-REACTIVE PROTEIN: CRP: 6.7 mg/L (ref <8.0)

## 2021-04-08 NOTE — Telephone Encounter (Signed)
-----  Message from Mignon Pine, DO sent at 04/08/2021  2:37 PM EDT ----- Please let patient know that inflammatory markers are improved and both ESR/CRP remain normal.  Will provide update once pharmacy has information on antibiotic infusion discussed at visit yesterday.    Thanks

## 2021-04-08 NOTE — Telephone Encounter (Signed)
[facility] Patient Advocate Encounter   Received notification from [employer] medicare part d that prior authorization for Dalvance is required.   PA submitted on 04/08/21 Key [id number] Status is pending    [facility] will continue to follow.   [name], [facility] Patient [facility] for Infectious Disease Phone: [phone] Fax:  [phone]

## 2021-04-08 NOTE — Telephone Encounter (Signed)
[facility] Patient Advocate Encounter  Prior Authorization for Dalvance has been approved.    PA# [id number] Effective dates: 04/08/21 through 04/16/21  Patient copay for 7 vials will be $626.91.  If the patient gets 1 vial copay is $89.60 If patient gets 4 vials copay is $ 358.26  [facility] will continue to follow.  [name], [facility] Patient [facility] Phone: [phone] Fax:  [phone]

## 2021-04-08 NOTE — Telephone Encounter (Signed)
Patient aware of results and verbalized his understanding.   [place], CMA

## 2021-04-09 ENCOUNTER — Telehealth: Payer: Self-pay

## 2021-04-09 ENCOUNTER — Other Ambulatory Visit (HOSPITAL_COMMUNITY): Payer: Self-pay

## 2021-04-09 NOTE — Telephone Encounter (Signed)
[facility] Patient Advocate Encounter  I called patient Insurance to check if Dalvance is covered under medical benefits.  Dalvance with J-code of [id number] is covered under medical benefits, patient have met his deductible of $3900.00 in network so the medication will be totally covered as long as the facility where he is getting his infusion (Short Stay) is in network.   REF #[id number]  [name], [place] Patient [facility] Phone: [phone] Fax:  [phone]

## 2021-04-12 ENCOUNTER — Other Ambulatory Visit (HOSPITAL_COMMUNITY): Payer: Self-pay

## 2021-04-13 ENCOUNTER — Ambulatory Visit: Payer: Medicare Other

## 2021-04-16 ENCOUNTER — Encounter: Payer: Self-pay | Admitting: Medical

## 2021-04-16 ENCOUNTER — Ambulatory Visit (INDEPENDENT_AMBULATORY_CARE_PROVIDER_SITE_OTHER): Payer: Medicare Other

## 2021-04-16 ENCOUNTER — Other Ambulatory Visit: Payer: Self-pay

## 2021-04-16 DIAGNOSIS — R7989 Other specified abnormal findings of blood chemistry: Secondary | ICD-10-CM | POA: Diagnosis not present

## 2021-04-16 MED ORDER — TESTOSTERONE CYPIONATE 200 MG/ML IM SOLN
200.0000 mg | Freq: Once | INTRAMUSCULAR | Status: DC
  Administered 2021-04-16: 200 mg via INTRAMUSCULAR

## 2021-04-16 NOTE — Telephone Encounter (Signed)
Can we given patient a sample of trelegy 100 or 200? This would replace Stiolto

## 2021-04-16 NOTE — Progress Notes (Signed)
Pt is here for Testerone injection. Pt was given Testerone injection in right glute. Pt tolerated well.   [place] with administration.   Mackie Pai, PA-C

## 2021-04-16 NOTE — Telephone Encounter (Signed)
"  Stiolto isn't working-wheezing and shortness of breath. It's been coming on-not a sudden change. Can you prescribe a different inhaler?"  Please advise. Thank you!

## 2021-04-19 MED ORDER — TRELEGY ELLIPTA 100-62.5-25 MCG/INH IN AEPB: 1.0000 | INHALATION_SPRAY | Freq: Every day | RESPIRATORY_TRACT | 0 refills | Status: DC

## 2021-04-21 ENCOUNTER — Encounter: Payer: Self-pay | Admitting: Pharmacist

## 2021-04-21 ENCOUNTER — Other Ambulatory Visit (HOSPITAL_COMMUNITY): Payer: Self-pay

## 2021-04-21 ENCOUNTER — Telehealth: Payer: Self-pay | Admitting: Medical

## 2021-04-21 MED ORDER — TESTOSTERONE CYPIONATE 200 MG/ML IM SOLN: 200.0000 mg | INTRAMUSCULAR | 0 refills | Status: DC

## 2021-04-21 MED ORDER — ZOLPIDEM TARTRATE 5 MG PO TABS: 5.0000 mg | ORAL_TABLET | Freq: Every evening | ORAL | 3 refills | Status: DC | PRN

## 2021-04-21 NOTE — Telephone Encounter (Signed)
Requesting: testosterone 200mg /mL injection Contract: n/a UDS: n/a Last Visit: 01/05/2021 Next Visit: None Last Refill: 03/01/2021 #51mL and 0RF  Please Advise

## 2021-04-21 NOTE — Telephone Encounter (Signed)
Rx ambien sent to pharmacy.

## 2021-04-23 ENCOUNTER — Telehealth: Payer: Self-pay | Admitting: Cardiology

## 2021-04-23 NOTE — Telephone Encounter (Signed)
Pt c/o medication issue:  1. Name of Medication: DILTIAZEM  2. How are you currently taking this medication (dosage and times per day)? NOT CURRENTLY TAKING  3. Are you having a reaction (difficulty breathing--STAT)? NO  4. What is your medication issue? PT PICKED UP THIS MED FROM THE PHARMACY TODAY AND HE IS UNSURE WHO PRESCRIBED OR WHY HE WAS PRESCRIBED THIS MEDICINE.

## 2021-04-23 NOTE — Telephone Encounter (Signed)
Spoke to patient he stated he just picked Diltiazem prescription at his pharmacy.Stated he does not take Diltiazem anymore.He takes Digoxin instead. Advised he needs to call his pharmacy and take back.

## 2021-04-25 ENCOUNTER — Other Ambulatory Visit: Payer: Self-pay | Admitting: Medical

## 2021-04-26 ENCOUNTER — Ambulatory Visit (INDEPENDENT_AMBULATORY_CARE_PROVIDER_SITE_OTHER): Payer: Medicare Other | Admitting: Internal Medicine

## 2021-04-26 ENCOUNTER — Encounter: Payer: Self-pay | Admitting: Internal Medicine

## 2021-04-26 ENCOUNTER — Other Ambulatory Visit: Payer: Self-pay

## 2021-04-26 VITALS — BP 122/72 | HR 85 | Temp 98.1°F | Ht 70.0 in | Wt 164.2 lb

## 2021-04-26 DIAGNOSIS — J449 Chronic obstructive pulmonary disease, unspecified: Secondary | ICD-10-CM

## 2021-04-26 DIAGNOSIS — J189 Pneumonia, unspecified organism: Secondary | ICD-10-CM

## 2021-04-26 DIAGNOSIS — T50905A Adverse effect of unspecified drugs, medicaments and biological substances, initial encounter: Secondary | ICD-10-CM

## 2021-04-26 DIAGNOSIS — J849 Interstitial pulmonary disease, unspecified: Secondary | ICD-10-CM

## 2021-04-26 DIAGNOSIS — Z87891 Personal history of nicotine dependence: Secondary | ICD-10-CM

## 2021-04-26 NOTE — Patient Instructions (Addendum)
ICD-10-CM   1. Drug-induced pneumonitis  J18.9    T50.905A     2. Chronic obstructive pulmonary disease, unspecified COPD type ([place])  J44.9     3. Stopped smoking with greater than 20 pack year history  Z87.891      Glad overall better but stil got crackles Trelegy helping you subjectively Flu shot and covid vaccine per PCP [place], Percell Miller, PA-C   Recommendations: - Contineu trelegy - vaccines per PCP - HRCT supine and prone early June 2023/late May 2023 - around 1 year anniversary of respiratory failure  Followup  - June 2023 after CT to see Dr Brantley Persons - 15 mn visit , ILD symptom score and walk test at followup

## 2021-04-26 NOTE — Progress Notes (Signed)
Hospital course 11/10/2020 - 11/23/2020:   .  Patient was recently admitted to [facility] from 11/10/2020 - 11/23/2020 for drug induced pneumonit  Patient presented to the hospital shortness of breath.  He was originally seen at PCPs office and found to be hypoxic.  He was referred to emergency room.  He underwent work-up with CTA chest which showed large right sided pneumonia with groundglass nodules and concern for central cavitation.  He was started on IV antibiotics on admission.  Infectious disease was consulted given prolonged antibiotic course at home and now with atypical pneumonia.  Echocardiogram was negative for valvular vegetation.  MRI was negative for deep infection but revealed a subcutaneous probable seroma.  IR was consulted per ID for aspiration/drainage and culturing of fluid.  He underwent aspiration on 11/13/2020.  Fluid cultures obtained showing no growth.  Patient hospital course complicated by right upper extremity DVT secondary to PICC line.  Patient developed worsening respiratory symptoms and increasing oxygen requirements.  He underwent repeat CT chest which showed worsening right-sided pneumonia.  Pulmonary was consulted and he underwent bronchoscopy on 11/17/2020.  Bronchial washings were generally unremarkable.  There was discussion regarding possibly underlying daptomycin pneumonitis.  Daptomycin was discontinued when cultures patient was de-escalated to Bactrim per ID recommendations.  Patient will follow-up outpatient with ID for ongoing antibiotic management.  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  Patient was discharged home on oxygen.  Will remain on steroids until follow-up with pulmonary on May 17.  He was discharged on Xarelto, given its provoked nature will likely be able to discontinue once imaging is negative in the next 3 to 6 months per protocol.  Patient was discharged on Bactrim  and Xanax given profound anxiety in relation to dyspnea.  Patient is aware Xanax is not and should not be permanent fixture or addition to his home medication list and will need follow-up with PCP to manage prescriptions further.   12/01/2020 -Remains on prednisone 1m daily until follow-up and assess when to taper dose -Continues Bactim BID -FOB on 5/3 with negtive pathology, BAL negative -Follow up with Dr. RChase Callerin ILD clinic in June   Patient reports that he is a little better.  He continues to have some shortness of breath with occasional wheezing.  He has a productive cough which has improved.  Underlying anxiety due to dyspnea symptoms.  He has had no emergencies requiring his albuterol rescue inhaler.  He used this twice since discharge.  He is using flutter valve and incentive spirometer.  He is still on Bactrim and has follow-up with infectious disease in the next week.  Reports difficulty sleeping at night due to steroid.  He has Ambien 5 mg as needed insomnia.  He is using Xanax twice a day as needed for anxiety.  He will follow-up with primary care next Monday. His appetite has improved some. Drinking protein shakes.  Denies fever chills sweats, chest tightness or chest pain.   12/15/2020 - Interim hx  Patient presents today for acute office visit. He had follow-up CT chest on 12/11/20 that showed small-moderate right pneumothorax, approx 15-20%. Previous right upper lobe consolidation has improved without residual consolidative components. There is persistent ground glass attenuation and cystic changes throughout RUL and ground glass in RML and lower lobes. Findings may be post infectious or inflammatory versus sequale of interstitial pneumonia. On call provider was notified of critical results. Patient was called ON 12/13/20  and made aware of the results. 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 Patient did report some increase in  shortness of breath over the last few days but denies chest pain. Patient opted to wait until Tuesday for follow-up CXR. If pneumothorax still present will need ED evaluation for possible chest tube placement. He has an apt in June with Dr. Chase Caller.   He feels well today. No significant change noted to his breathing. Still able to do some chores indoors. He does not get out of breath at home. He is wearing 2L oxygen. He fell Saturday 5/28, states that he got tripped up with his dog. He hit his face on refrigerator. No LOC or change in mental status. He is on blood thinner. Neuros intact. Bruising to nose.   CXR today showed improvement apical pneumothorax that was seen on previous CT chest, possibly still present on lateral view. Had Dr. Erin Fulling review imaging. Overall reassuring. Plan continue to monitor with repeat CXR on Thursday.   Imaging:  11/23/20 CXR - Persistent right pulmonary opacities and right pleural effusion.Aeration improved compared to 11/17/2020.  12/11/20- CT CHEST WO CONTRAST IMPRESSION: 1. Small to moderate right pneumothorax, approximately 15-20%. This was not present on prior chest imaging, but is not significantly changed from recent thoracic spine radiograph 12/03/2020. 2. Previous right upper lobe consolidation has improved, without residual consolidative components. There is persistent ground-glass attenuation and cystic changes throughout the right upper lobe, with geographic pulmonary 10 UA shin and ground-glass in the right middle and lower lobes. There is mild associated right lung bronchiectasis. Findings may be due to post infectious or inflammatory sequela, versus sequela of interstitial pneumonia. 3. Improved mediastinal adenopathy from prior exam. 4. Faint ground-glass attenuation in the lingula and anterior left upper lobe was present on prior and mildly improved, likely infectious or inflammatory. 5. Stable pulmonary nodules dating back to June 2021  exam     OV 12/29/2020  Subjective:  [name], male , DOB: [birth year] , age 75 y.o. , MRN: [id number] , ADDRESS: [place] [id number] PCP Mackie Pai, PA-C Patient Care Team: Elise Benne as PCP - General (Internal Medicine) Keene Breath., MD (Ophthalmology) Jessy Oto, MD as Consulting Physician (Orthopedic Surgery)  This Provider for this visit: Treatment Team:  Attending Provider: Brand Males, MD    12/29/2020 -   Chief Complaint  Patient presents with   Follow-up    PFT  performed 5/24.  Pt states since last visit, he has been doing okay. States he has lost some weight that he has noticed since last visit.    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 -returns for follow-up.  I personally saw him in the hospital on 11/20/2020.  At that point in time he had been started on Solu-Medrol on 11/18/2020.  When I saw him he was on day 3 of Solu-Medrol and he was needing 3 L nasal cannula oxygen at rest.  At that point in time even with 3 L he was desaturating to 86% just talking.  Put him on prednisone 60 mg/day.  After that he see nurse practitioner x2 at least 1 of which was a telephone visit..  As part of his follow-up on 12/11/2020 he had a CT scan of the chest [3 days after pulmonary function test] and on my personal visualization it showed new onset pneumothorax but significant improvement  in infiltrates and inflammation.  He had another follow-up chest x-ray 12/17/2020 in which no pneumothorax reported [similar on 12/15/2020] and pulmonary infiltrates appear improved.  He is now here with his wife  He tells me he is now on 10 mg/day prednisone he just darted that today.  He is 1 week left and after that the prednisone taper ends.  Because of the pneumothorax is physical therapy has been on hold but 4 days ago he was evaluated and apparently his pulse ox was 100% but this was  on oxygen.  He continues to use his oxygen although he is not sure if he is desaturating anymore.  He does feel overall improved compared to 6 weeks ago but still has significant amount of fatigue.  He has significant insomnia which he believes is due to prednisone.  Today's last night was the first night he could sleep when he dropped his prednisone down to 10 mg/day.  He believes his fatigue is multifactorial related to the disease, multiple medications, lack of oxygen, insomnia etc.    02/09/2021 Patient presents today for 1 month follow-up. Since his last visit in June, he has established with cardiology and was dx with afib. He is currently on Xarelto and Digoxin. His wife states that he has had more energy recently. He is scheduled for testosterone injection tomorrow. He gets winded with stairs and heavy exertion. He reports intermittent wheezing. He needs refill of albuterol rescue inhaler. He has weaned himself of oxygen and O2 equipment has been picked up. He has been sleeping better at night. Anxiety has improved, he stopped xanax. Due for follow-up CXR today. 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  02/23/2021- Interim hx  Patient presents today for 2 week follow-up. During his last visit he was given trial Spiriva Respimat d/t dyspnea /wheezing symptoms and moderate obstruction seen on his PFTs. He has been using sample of Spiriva Respimat 2.23mg as prescribed, he occasionally misses a dose for 1-2 days. He states that his wheezing has stopped but he has not noticed a significant difference in dyspnea symptoms. His ILD scale appears improved from two weeks go. He has having some muscle spasms in his back that started before being placed on LABA.   Subjective:  [name] male , DOB: 203-Aug-1947, age 75y.o. 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 -returns for follow-up.  Since I last saw him he seen nurse practitioner 2 times.  Symptom scores for dyspnea continue to improve.  He was also walked for oxygen desaturation February 04, 2019 did not desaturate.  He says he continues to get better.  He is off prednisone.  Nurse practitioner noticed obstructive physiology on the pulmonary function test.  Put him on inhaler.  Currently is on Trelegy.  He says it helps him tremendously.  He feels it opens up his lungs.  He declined  his flu shot today because he will have it after the COVID-vaccine in October or November 2022.     SYMPTOM SCALE - ILD 12/29/2020  02/09/2021  02/23/2021   O2 use 2-3L [place] ahome RA RA  Shortness of Breath 0 -> 5 scale with 5 being worst (score 6 If unable to do)    At rest 0 0 1  Simple tasks - showers, clothes change, eating, shaving 1 0 0  Household (dishes, doing bed, laundry) 1 3 0  Shopping Not yeet 0 0  Walking level at own pace 2 0 0  Walking up Stairs _0 Total (30-36) Dyspnea Score _1 How bad is your cough? _2 How bad is your fatigue 0 2 1  How bad is nausea 0 0 0  How bad is vomiting?  0 0 0  How bad is diarrhea? 0 0 0  How bad is anxiety? 3 1 0  How bad is depression 1 0 0    Walking desaturation test on room air with a forehead probe today  Simple office walk 185 feet x  3 laps goal with forehead probe 12/29/2020  02/09/2021   O2 used ra RA  Number laps completed 3 3   Comments about pace steady Average pace   Resting Pulse Ox/HR 97% and 120/min 97% and 97/min  Final Pulse Ox/HR 98% and 137/min 93% and 110/min  Desaturated </= 88% no no  Desaturated <= 3% points no yes  Got Tachycardic >/= 90/min yes yes  Symptoms at end of test Dyspneic atn2nd lap SOB on 3rd lap   Miscellaneous comments x x      PFT  PFT Results Latest Ref Rng & Units 12/08/2020  FVC-Pre L 2.97  FVC-Predicted Pre % 71  FVC-Post L 2.96  FVC-Predicted Post % 71  Pre FEV1/FVC % % 66  Post FEV1/FCV % % 70  FEV1-Pre L 1.97  FEV1-Predicted Pre % 65  FEV1-Post L 2.07  DLCO uncorrected ml/min/mmHg 18.01  DLCO UNC% % 72  DLCO corrected ml/min/mmHg 20.52  DLCO COR %Predicted % 82  DLVA Predicted % 120  TLC L 5.04  TLC % Predicted % 71  RV % Predicted % 95    IMPRESSION: Resolution of previously seen patchy infiltrates in the right lung. Mild scattered pulmonary scarring.     Electronically Signed   By: Nelson Chimes M.D.   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   reports that he quit smoking about 31 years ago. His smoking use included cigarettes. He has a 20.00 pack-year smoking history. He has never used smokeless tobacco.  Past Surgical History:  Procedure  Laterality Date   BACK SURGERY  2012 and 1994   Dr Margret Chance, screws and cage in low back   BIOPSY  01/09/2020   Procedure: BIOPSY;  Surgeon: Otis Brace, MD;  Location: [facility];  Service: Gastroenterology;;   BRONCHIAL BIOPSY  11/17/2020   Procedure: BRONCHIAL BIOPSIES;  Surgeon: Laurin Coder, MD;  Location: [facility];  Service: Endoscopy;;   BRONCHIAL BRUSHINGS  11/17/2020   Procedure: BRONCHIAL BRUSHINGS;  Surgeon: Laurin Coder, MD;  Location: [facility];  Service: Endoscopy;;   BRONCHIAL WASHINGS  11/17/2020   Procedure: BRONCHIAL WASHINGS;  Surgeon: Laurin Coder, MD;  Location: [facility];  Service: Endoscopy;;   COLONOSCOPY WITH PROPOFOL N/A 01/09/2020   Procedure: COLONOSCOPY WITH PROPOFOL;  Surgeon: Otis Brace, MD;  Location: [facility];  Service: Gastroenterology;  Laterality: N/A;   ESOPHAGOGASTRODUODENOSCOPY (EGD) WITH PROPOFOL N/A 01/09/2020   Procedure: ESOPHAGOGASTRODUODENOSCOPY (EGD) WITH PROPOFOL;  Surgeon: Otis Brace, MD;  Location: [facility];  Service: Gastroenterology;  Laterality: N/A;   EYE SURGERY Bilateral    2016   HARDWARE REVISION  03/12/2019   REVISION OF HARDWARE THORACIC TEN-THORACIC ELEVEN WITH APPLICATION OF ADDITIONAL RODS AND ROD SLEEVES THORACIC EIGHT-THORACIC TWELVE 12-LUMBAR ONE (N/A )   HEMOSTASIS CONTROL  11/17/2020   Procedure: HEMOSTASIS CONTROL;  Surgeon: Laurin Coder, MD;  Location: [facility];  Service: Endoscopy;;   IR US GUIDE BX ASP/DRAIN  11/13/2020   LAMINECTOMY  02/12/2019   DECOMPRESSIVE LAMINECTOMY THORACIC NINE-THORACIC TEN AND THORACIC TEN-THORACIC ELEVEN, EXTENSION OF THORACOLUMBAR FUSION FROM THORACIC TEN TO THORACIC FIVE, LOCAL BONE GRAFT, ALLOGRAFT AND VIVIGEN (N/A)   POLYPECTOMY  01/09/2020   Procedure: POLYPECTOMY;  Surgeon: Otis Brace, MD;  Location: [facility];  Service: Gastroenterology;;   REMOVAL OF RODS AND PEDICLE SCREWS T5 AND T6, EXPLORATION OF FUSION, BIOPSY  TRANSPEDICULAR T5 AND T6 (N/A )  09/14/2020   SPINAL FUSION N/A 02/12/2019   Procedure: DECOMPRESSIVE LAMINECTOMY THORACIC NINE-THORACIC TEN AND THORACIC TEN-THORACIC ELEVEN, EXTENSION OF THORACOLUMBAR FUSION FROM THORACIC TEN TO THORACIC FIVE, LOCAL BONE GRAFT, ALLOGRAFT AND VIVIGEN;  Surgeon: Jessy Oto, MD;  Location: [place];  Service: Orthopedics;  Laterality: N/A;  DECOMPRESSIVE LAMINECTOMY THORACIC NINE-THORACIC TEN AND THORACIC TEN-THORACIC ELEVEN, EXTENSION OF TH   SPINAL FUSION N/A 03/12/2019   Procedure: REVISION OF HARDWARE THORACIC TEN-THORACIC ELEVEN WITH APPLICATION OF ADDITIONAL RODS AND ROD SLEEVES THORACIC EIGHT-THORACIC TWELVE 12-LUMBAR ONE;  Surgeon: Jessy Oto, MD;  Location: [place];  Service: Orthopedics;  Laterality: N/A;   TONSILLECTOMY     as child   torn rotator cuff  2010   right   VIDEO BRONCHOSCOPY N/A 11/17/2020   Procedure: VIDEO BRONCHOSCOPY WITH FLUORO;  Surgeon: Laurin Coder, MD;  Location: [facility];  Service: Endoscopy;  Laterality: N/A;    Allergies  Allergen Reactions   Daptomycin     Drug induced pneumonia     Immunization History  Administered Date(s) Administered   Fluad Quad(high Dose 65+) 04/05/2019, 06/29/2020   Influenza Split 04/23/2012   Influenza Whole 02/16/2011   Influenza, High Dose Seasonal PF 05/06/2013, 03/25/2016, 05/05/2017, 06/07/2018   PFIZER(Purple Top)SARS-COV-2 Vaccination 08/23/2019, 09/13/2019, 06/07/2020   Pneumococcal Conjugate-13 03/25/2016   Pneumococcal Polysaccharide-23 01/20/2009, 10/19/2011   Td 07/09/2003, 07/18/2008   Tdap 07/20/2011   Zoster, Live 04/27/2016    Family History  Problem Relation Age of Onset   Hypertension Mother    Diabetes Mother        type 2   Cancer Mother 38       breast in remission   Emphysema Father        smoker   COPD Father        smoker   Stroke Father 35       mini   Heart disease Father    Hypertension Sister  Hyperlipidemia Sister    Scoliosis Sister     Osteoporosis Sister    Hypertension Maternal Grandmother    Scoliosis Maternal Grandmother    Heart disease Maternal Grandfather    Heart disease Paternal Grandfather        smoker   Heart disease Daughter      Current Outpatient Medications:    atorvastatin (LIPITOR) 10 MG tablet, TAKE 1 TABLET BY MOUTH EVERY DAY, Disp: 30 tablet, Rfl: 0   digoxin (LANOXIN) 0.125 MG tablet, Take 1 tablet (0.125 mg total) by mouth daily., Disp: 90 tablet, Rfl: 3   ferrous fumarate (FERRETTS) 325 (106 Fe) MG TABS tablet, Take 1 tablet (106 mg of iron total) by mouth daily., Disp: 90 tablet, Rfl: 3   Fluticasone-Umeclidin-Vilant (TRELEGY ELLIPTA) 100-62.5-25 MCG/INH AEPB, Inhale 1 puff into the lungs daily., Disp: 2 each, Rfl: 0   glucose blood (CONTOUR NEXT TEST) test strip, Check blood sugar once daily, Disp: 100 each, Rfl: 12   JANUVIA 50 MG tablet, TAKE 1 TABLET BY MOUTH EVERY DAY, Disp: 30 tablet, Rfl: 3   metFORMIN (GLUCOPHAGE) 1000 MG tablet, Take 1 tablet (1,000 mg total) by mouth 2 (two) times daily with a meal., Disp: 180 tablet, Rfl: 0   metoprolol tartrate (LOPRESSOR) 100 MG tablet, Take 1 tablet (100 mg total) two hours prior to CT testing, Disp: 1 tablet, Rfl: 0   pantoprazole (PROTONIX) 40 MG tablet, TAKE 1 TABLET BY MOUTH TWICE A DAY BEFORE A MEAL, Disp: 180 tablet, Rfl: 1   pregabalin (LYRICA) 75 MG capsule, TAKE 1 CAPSULE BY MOUTH TWICE A DAY, Disp: 60 capsule, Rfl: 3   PROVENTIL HFA 108 (90 Base) MCG/ACT inhaler, TAKE 2 PUFFS BY MOUTH EVERY 6 HOURS AS NEEDED FOR WHEEZE OR SHORTNESS OF BREATH, Disp: 6.7 each, Rfl: 5   rivaroxaban (XARELTO) 20 MG TABS tablet, Take 1 tablet (20 mg total) by mouth daily with supper., Disp: 90 tablet, Rfl: 3   sucralfate (CARAFATE) 1 g tablet, TAKE 1 TABLET (1 G TOTAL) BY MOUTH 4 (FOUR) TIMES DAILY - WITH MEALS AND AT BEDTIME., Disp: 120 tablet, Rfl: 1   tamsulosin (FLOMAX) 0.4 MG CAPS capsule, Take 2 capsules (0.8 mg total) by mouth daily., Disp: 180 capsule,  Rfl: 3   Tedizolid Phosphate 200 MG TABS, Take 1 tablet by mouth daily., Disp: 30 tablet, Rfl: 1   testosterone cypionate (DEPOTESTOSTERONE CYPIONATE) 200 MG/ML injection, Inject 1 mL (200 mg total) into the muscle every 14 (fourteen) days., Disp: 2 mL, Rfl: 0   tiZANidine (ZANAFLEX) 4 MG tablet, Take 1.5 tablets (6 mg total) by mouth every 6 (six) hours as needed for muscle spasms., Disp: 540 tablet, Rfl: 1   venlafaxine XR (EFFEXOR-XR) 150 MG 24 hr capsule, TAKE 1 CAPSULE BY MOUTH DAILY WITH BREAKFAST., Disp: 90 capsule, Rfl: 1   zolpidem (AMBIEN) 5 MG tablet, Take 1 tablet (5 mg total) by mouth at bedtime as needed. for sleep, Disp: 14 tablet, Rfl: 3   Tiotropium Bromide-Olodaterol (STIOLTO RESPIMAT) 2.5-2.5 MCG/ACT AERS, Inhale 2 puffs into the lungs daily. (Patient not taking: Reported on 04/26/2021), Disp: 4 g, Rfl: 0   Tiotropium Bromide-Olodaterol (STIOLTO RESPIMAT) 2.5-2.5 MCG/ACT AERS, Inhale 2 puffs into the lungs daily. (Patient not taking: Reported on 04/26/2021), Disp: 1 each, Rfl: 3      Objective:   Vitals:   04/26/21 1032  BP: 122/72  Pulse: 85  Temp: 98.1 F (36.7 C)  SpO2: 95%  Weight: 164 lb 3.2 oz (74.5 kg)  Height: _0  (1.778 m)    Estimated body mass index is 23.56 kg/m as calculated from the following:   Height as of this encounter: _1  (1.778 m).   Weight as of this encounter: 164 lb 3.2 oz (74.5 kg).  _2 @  Filed Weights   04/26/21 1032  Weight: 164 lb 3.2 oz (74.5 kg)     Physical Exam  General: No distress. Looks better Neuro: Alert and Oriented x 3. GCS 15. Speech normal Psych: Pleasant Resp:  Barrel Chest - no.  Wheeze - no, Crackles -some faint bilateral bibasal crackles present, No overt respiratory distress CVS: Normal heart sounds. Murmurs - no Ext: Stigmata of Connective Tissue Disease - no but still got crackles HEENT: Normal upper airway. PEERL +. No post nasal drip        Assessment:       ICD-10-CM   1.  Drug-induced pneumonitis  J18.9    T50.905A     2. Chronic obstructive pulmonary disease, unspecified COPD type ([place])  J44.9     3. Stopped smoking with greater than 20 pack year history  Z87.891     4. Interstitial pulmonary disease (H[facility])  J84.9 CT Chest High Resolution         Plan:     Patient Instructions     ICD-10-CM   1. Drug-induced pneumonitis  J18.9    T50.905A     2. Chronic obstructive pulmonary disease, unspecified COPD type ([place])  J44.9     3. Stopped smoking with greater than 20 pack year history  Z87.891      Glad overall better but stil got crackles Trelegy helping you subjectively Flu shot and covid vaccine per PCP Mackie Pai, PA-C   Recommendations: - Contineu trelegy - vaccines per PCP - HRCT supine and prone early June 2023/late May 2023 - around 1 year anniversary of respiratory failure  Followup  - June 2023 after CT to see Dr Brantley Persons - 15 mn visit , ILD symptom score and walk test at followup    SIGNATURE    Dr. Brand Males, M.D., F.C.C.P,  Pulmonary and Critical Care Medicine Staff Physician, [place] Director - Interstitial Lung Disease  Program  Pulmonary [place] [facility] [place], [place], [address]  Pager: [phone], If no answer or between  15:00h - 7:00h: call [phone] Telephone: [phone]  11:07 AM 04/26/2021

## 2021-04-26 NOTE — Telephone Encounter (Signed)
Called pt to see about the mix up. He states the pharmacy just auto refilled the previous prescription. He will call them to have them remove it from his medication list. Pt's medication list is up to date in our system.

## 2021-04-29 ENCOUNTER — Ambulatory Visit: Payer: Medicare Other | Attending: Internal Medicine

## 2021-04-29 ENCOUNTER — Other Ambulatory Visit (HOSPITAL_COMMUNITY): Payer: Self-pay

## 2021-04-29 DIAGNOSIS — Z23 Encounter for immunization: Secondary | ICD-10-CM

## 2021-04-29 NOTE — Progress Notes (Signed)
   Covid-19 Vaccination Clinic  Name:  Luis Brown    MRN: [id number] DOB: June 16, 1946  04/29/2021  Luis Brown was observed post Covid-19 immunization for 15 minutes without incident. He was provided with Vaccine Information Sheet and instruction to access the V-Safe system.   Luis Brown was instructed to call 911 with any severe reactions post vaccine: Difficulty breathing  Swelling of face and throat  A fast heartbeat  A bad rash all over body  Dizziness and weakness

## 2021-04-30 NOTE — Telephone Encounter (Signed)
MR please advise. Thanks   I was trying to figure out what it was about this med when I was in your office. I know now, it seems to make me short of breath! I become very aware of my breathing and I don't breath real deep . May not be the med I need! Thanks

## 2021-05-03 ENCOUNTER — Encounter: Payer: Self-pay | Admitting: Pharmacist

## 2021-05-03 ENCOUNTER — Telehealth: Payer: Self-pay | Admitting: Medical

## 2021-05-03 ENCOUNTER — Other Ambulatory Visit: Payer: Self-pay | Admitting: Pharmacist

## 2021-05-03 ENCOUNTER — Encounter: Payer: Self-pay | Admitting: Medical

## 2021-05-03 ENCOUNTER — Other Ambulatory Visit (HOSPITAL_COMMUNITY): Payer: Self-pay

## 2021-05-03 DIAGNOSIS — M961 Postlaminectomy syndrome, not elsewhere classified: Secondary | ICD-10-CM | POA: Diagnosis not present

## 2021-05-03 DIAGNOSIS — G894 Chronic pain syndrome: Secondary | ICD-10-CM | POA: Diagnosis not present

## 2021-05-03 DIAGNOSIS — M150 Primary generalized (osteo)arthritis: Secondary | ICD-10-CM | POA: Diagnosis not present

## 2021-05-03 DIAGNOSIS — M4624 Osteomyelitis of vertebra, thoracic region: Secondary | ICD-10-CM | POA: Diagnosis not present

## 2021-05-03 DIAGNOSIS — L089 Local infection of the skin and subcutaneous tissue, unspecified: Secondary | ICD-10-CM

## 2021-05-03 MED ORDER — ALBUTEROL SULFATE HFA 108 (90 BASE) MCG/ACT IN AERS: INHALATION_SPRAY | RESPIRATORY_TRACT | 5 refills | Status: DC

## 2021-05-03 MED ORDER — TEDIZOLID PHOSPHATE 200 MG PO TABS
200.0000 mg | ORAL_TABLET | Freq: Every day | ORAL | 0 refills | Status: DC
  Filled 2021-05-03: qty 14, 14d supply, fill #0

## 2021-05-03 MED ORDER — ZOLPIDEM TARTRATE 5 MG PO TABS: 5.0000 mg | ORAL_TABLET | Freq: Every evening | ORAL | 0 refills | Status: DC | PRN

## 2021-05-03 NOTE — Telephone Encounter (Signed)
Rx ambien. Please schedule pt for 40 minute appointment within next month. Controlled med visit in additon to multiple medical problems.

## 2021-05-04 ENCOUNTER — Other Ambulatory Visit (HOSPITAL_COMMUNITY): Payer: Self-pay

## 2021-05-04 NOTE — Telephone Encounter (Signed)
Dr. Chase Caller please advise on the following My Chart message:   Trelegy is the powdered inhaler. It seems to leave me short of breath. I can't remember the first one, it was the one that you turned the bottle for use. It was better than the powder. Could I try the first one again? Going out of town sat. For a week. Thank you and sorry to be so much trouble!    Rosana Berger, CMA  Venetia Maxon E "Ike" 3 hours ago (12:27 PM)   Danielle Dess, are you referring to the trelegy inhaler? It looks like Dr Chase Caller was thinking it was helping you at your last office visit. We already sent a prescription for an albuterol inhaler. Is this the "puffer" that you refer to? Why do you not feel that the trelegy is helping?    Venetia Maxon E "Ike"  P [facility] (supporting Lisle, Belva Crome, MD) 4 hours ago (11:09 AM)   I don't think the powered puffer is to effective! I was wondering if I could try the puffer we used the first time? Thanks [employer]

## 2021-05-04 NOTE — Telephone Encounter (Signed)
Appt made for 11/1 at 10 for 40 mins

## 2021-05-05 ENCOUNTER — Ambulatory Visit: Payer: Medicare Other | Admitting: Specialist

## 2021-05-05 MED ORDER — STIOLTO RESPIMAT 2.5-2.5 MCG/ACT IN AERS: 2.0000 | INHALATION_SPRAY | Freq: Every day | RESPIRATORY_TRACT | 5 refills | Status: DC

## 2021-05-05 NOTE — Telephone Encounter (Signed)
Ok to chage back to [employer] as prior

## 2021-05-06 ENCOUNTER — Other Ambulatory Visit: Payer: Self-pay | Admitting: Medical

## 2021-05-07 ENCOUNTER — Other Ambulatory Visit: Payer: Self-pay

## 2021-05-07 ENCOUNTER — Ambulatory Visit (INDEPENDENT_AMBULATORY_CARE_PROVIDER_SITE_OTHER): Payer: Medicare Other

## 2021-05-07 DIAGNOSIS — R7989 Other specified abnormal findings of blood chemistry: Secondary | ICD-10-CM

## 2021-05-07 MED ORDER — TESTOSTERONE CYPIONATE 200 MG/ML IM SOLN
200.0000 mg | INTRAMUSCULAR | Status: DC
  Administered 2021-05-07: 200 mg via INTRAMUSCULAR

## 2021-05-07 NOTE — Progress Notes (Addendum)
Pt is here today for testosterone injection. Pt was given testosterone injection in right glute. Pt tolerated well.  Mackie Pai, PA-C

## 2021-05-14 ENCOUNTER — Other Ambulatory Visit (HOSPITAL_COMMUNITY): Payer: Self-pay

## 2021-05-14 ENCOUNTER — Telehealth: Payer: Self-pay | Admitting: *Deleted

## 2021-05-14 NOTE — Telephone Encounter (Signed)
Informed pt of scheduling change at the hospital and AFib ablation needs to be r/s. Aware I am working on 11/21, but will not know until Monday. Aware I will be in touch next week. Pt agreeable to plan.

## 2021-05-17 DIAGNOSIS — G8929 Other chronic pain: Secondary | ICD-10-CM | POA: Diagnosis not present

## 2021-05-17 DIAGNOSIS — M546 Pain in thoracic spine: Secondary | ICD-10-CM | POA: Diagnosis not present

## 2021-05-18 ENCOUNTER — Other Ambulatory Visit: Payer: Self-pay | Admitting: Medical

## 2021-05-18 ENCOUNTER — Ambulatory Visit (INDEPENDENT_AMBULATORY_CARE_PROVIDER_SITE_OTHER): Payer: Medicare Other | Admitting: Medical

## 2021-05-18 ENCOUNTER — Other Ambulatory Visit: Payer: Self-pay

## 2021-05-18 VITALS — BP 116/65 | HR 60 | Temp 98.2°F | Resp 18 | Ht 70.0 in | Wt 167.0 lb

## 2021-05-18 DIAGNOSIS — I4891 Unspecified atrial fibrillation: Secondary | ICD-10-CM | POA: Diagnosis not present

## 2021-05-18 DIAGNOSIS — G8929 Other chronic pain: Secondary | ICD-10-CM

## 2021-05-18 DIAGNOSIS — M549 Dorsalgia, unspecified: Secondary | ICD-10-CM

## 2021-05-18 DIAGNOSIS — I10 Essential (primary) hypertension: Secondary | ICD-10-CM | POA: Diagnosis not present

## 2021-05-18 DIAGNOSIS — R7989 Other specified abnormal findings of blood chemistry: Secondary | ICD-10-CM | POA: Diagnosis not present

## 2021-05-18 DIAGNOSIS — G4700 Insomnia, unspecified: Secondary | ICD-10-CM

## 2021-05-18 DIAGNOSIS — E119 Type 2 diabetes mellitus without complications: Secondary | ICD-10-CM | POA: Diagnosis not present

## 2021-05-18 DIAGNOSIS — M4624 Osteomyelitis of vertebra, thoracic region: Secondary | ICD-10-CM

## 2021-05-18 DIAGNOSIS — Z23 Encounter for immunization: Secondary | ICD-10-CM | POA: Diagnosis not present

## 2021-05-18 DIAGNOSIS — J438 Other emphysema: Secondary | ICD-10-CM | POA: Diagnosis not present

## 2021-05-18 NOTE — Patient Instructions (Addendum)
Diabetes.  Continue metformin and Januvia.  Placing future A1c.  Hopefully cardiologist lab can draw the A1c when CBC and CMP are done prior to your ablation procedure.  Atrial fibrillation.  Continue Xarelto, digoxin and beta-blocker.   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  Continue antibiotics prescribed by infectious disease and continue to follow-up pain management.  COPD.  Presently clinically stable.  Continue on inhalers prescribed by pulmonologist.  Insomnia.  Ambien working about half a tab per your report.  We will continue to refill Ambien as needed.  Hypertension.  Blood pressure well controlled today.  Low testosterone.  Continue with testosterone injections every 2 weeks.  Most recent CBC showed normal platelets and normal hemoglobin/hematocrit.  Follow up in 6 weeks or sooner if needed.

## 2021-05-18 NOTE — Addendum Note (Signed)
Addended by: Jeronimo Greaves on: 05/18/2021 11:08 AM   Modules accepted: Orders

## 2021-05-18 NOTE — Progress Notes (Signed)
Subjective:    [name], male    DOB: 02-26-1946, 75 y.o.   MRN: [id number]  HPI  Pt in for follow up.  Pt states he saw orthopedist. He states screws that are possible loose. He states surgery to repair this would be too risky. He is relying on pain management for this.   Pt also has area of  back infection.  1. 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HCC)   Continues to have thoracic back pain and currently on antibiotics.  Recent CT showed hardware loosening at T7-9 that may reflect infection but not definitive and currently no plans for surgical intervention from his primary orthopedic surgeon.  He is interested in 2nd opinion from [facility] to see if further intervention would be recommended.     Inflammatory markers were improved when last checked and will repeat again today.  Discussed with pharmacy regarding antibiotic plan and will have him continue Tedizolid for another 4 weeks (thus will have done 8 weeks total + 10 days linezolid) and hopefully arrange for dalbavancin infusion every 2 weeks as a suppressive regimen.  Will also place NSGY referral for him to receive 2nd opinion that he would like.   Hx of insomnia. Pt currently on ambien for this. Works about 50% of the time.   Pt will get flu vaccine today. He states he got covid vaccine 2 weeks ago.  Hx of low tesosterone. Former pcp Dr. [name] started on testosterone injections.  I have continued to rx as wel.   Diabetes- pt on januvia and metformin.  Rt buttox slight raised bumps for 2 weeks. No pain. No vesicles. No warmth.  Hx of atrial fibrillatoin. On digoxin , xarelto and b blocker.Pt will be getting ablation. Labs placed future cbc and cmp.     Review of Systems  Constitutional:  Negative for chills, fatigue and fever.  HENT:  Negative for congestion and drooling.   Respiratory:  Negative for cough, chest tightness, shortness of breath and wheezing.   Cardiovascular:  Negative for chest pain  and palpitations.  Gastrointestinal:  Negative for abdominal pain.  Genitourinary:  Negative for dysuria and flank pain.  Musculoskeletal:  Positive for back pain. Negative for neck stiffness.  Skin:  Negative for rash.  Neurological:  Negative for dizziness, tremors, speech difficulty, weakness, numbness and headaches.  Hematological:  Negative for adenopathy. Does not bruise/bleed easily.  Psychiatric/Behavioral:  Positive for sleep disturbance. Negative for behavioral problems, decreased concentration and suicidal ideas. The patient is not nervous/anxious.     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   Esophageal reflux 02/10/2015   Fatigue    HTN (hypertension)    Hyperglycemia    preDM    Hyperlipidemia    Insomnia    Low back pain radiating to both legs 01/16/2017   Measles as a child   Overweight(278.02)    Peripheral neuropathy 08/07/2019   Personal history of colonic polyps 10/27/2012   Follows with  [facility]   Pneumonia    " walking"   Preventative health care    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   Wears glasses      Social History   Socioeconomic History   Marital status: Married    Spouse name: Not on file   Number of children: Not on file   Years of education: Not on file   Highest education level: Not on file  Occupational History   Not on file  Tobacco Use   Smoking status: Former    Packs/day: 1.00    Years: 20.00    Pack years: 20.00    Types: Cigarettes    Quit date: 07/18/1989    Years since  quitting: 31.8   Smokeless tobacco: Never  Vaping Use   Vaping Use: Never used  Substance and Sexual Activity   Alcohol use: Not Currently   Drug use: No   Sexual activity: Yes    Comment: lives with wife, still working, no dietary restrictions, continues to exercise intermittently  Other Topics Concern   Not on file  Social History Narrative   Not on file   Social Determinants of Health   Financial Resource Strain: Low Risk    Difficulty of Paying Living Expenses: Not hard at all  Food Insecurity: No Food Insecurity   Worried About Charity fundraiser in the Last Year: Never true   Arboriculturist in the Last Year: Never true  Transportation Needs: No Transportation Needs   Lack of Transportation (Medical): No   Lack of Transportation (Non-Medical): No  Physical Activity: Sufficiently Active   Days of Exercise per Week: 7 days   Minutes of Exercise per Session: 30 min  Stress: No Stress Concern Present   Feeling of Stress : Not at all  Social Connections: Moderately Integrated   Frequency of Communication with Friends and Family: More than three times a week   Frequency of Social Gatherings with Friends and Family: More than three times a week   Attends Religious Services: More than 4 times per year   Active Member of Clubs or Organizations: No   Attends Archivist Meetings: Never   Marital Status: Married  Human resources officer Violence: Not At Risk   Fear of Current or Ex-Partner: No   Emotionally Abused: No   Physically Abused: No   Sexually Abused: No    Past Surgical History:  Procedure Laterality Date   BACK SURGERY  2012 and 1994   Dr Margret Chance, screws and cage in low back   BIOPSY  01/09/2020   Procedure: BIOPSY;  Surgeon: Otis Brace, MD;  Location: [facility];  Service: Gastroenterology;;   BRONCHIAL BIOPSY  11/17/2020   Procedure: BRONCHIAL BIOPSIES;  Surgeon: Laurin Coder, MD;  Location: [facility];  Service: Endoscopy;;   BRONCHIAL  BRUSHINGS  11/17/2020   Procedure: BRONCHIAL BRUSHINGS;  Surgeon: Laurin Coder, MD;  Location: [facility];  Service: Endoscopy;;   BRONCHIAL WASHINGS  11/17/2020   Procedure: BRONCHIAL WASHINGS;  Surgeon: Laurin Coder, MD;  Location: [facility];  Service: Endoscopy;;   COLONOSCOPY WITH PROPOFOL N/A 01/09/2020   Procedure: COLONOSCOPY WITH PROPOFOL;  Surgeon: Otis Brace, MD;  Location: [facility];  Service: Gastroenterology;  Laterality: N/A;   ESOPHAGOGASTRODUODENOSCOPY (EGD) WITH PROPOFOL N/A 01/09/2020   Procedure: ESOPHAGOGASTRODUODENOSCOPY (EGD) WITH PROPOFOL;  Surgeon: Otis Brace, MD;  Location: [facility];  Service: Gastroenterology;  Laterality: N/A;   EYE SURGERY Bilateral    2016   HARDWARE REVISION  03/12/2019   REVISION OF HARDWARE THORACIC TEN-THORACIC ELEVEN WITH APPLICATION OF ADDITIONAL RODS AND ROD SLEEVES THORACIC EIGHT-THORACIC TWELVE 12-LUMBAR ONE (N/A )   HEMOSTASIS CONTROL  11/17/2020   Procedure: HEMOSTASIS CONTROL;  Surgeon: Laurin Coder, MD;  Location: [facility];  Service: Endoscopy;;   IR US GUIDE BX ASP/DRAIN  11/13/2020   LAMINECTOMY  02/12/2019   DECOMPRESSIVE LAMINECTOMY THORACIC NINE-THORACIC TEN AND THORACIC TEN-THORACIC ELEVEN, EXTENSION OF THORACOLUMBAR FUSION FROM THORACIC TEN TO THORACIC FIVE, LOCAL BONE GRAFT, ALLOGRAFT AND VIVIGEN (N/A)   POLYPECTOMY  01/09/2020   Procedure: POLYPECTOMY;  Surgeon: Otis Brace, MD;  Location: [facility];  Service: Gastroenterology;;   REMOVAL OF RODS AND PEDICLE SCREWS T5 AND T6, EXPLORATION OF FUSION, BIOPSY TRANSPEDICULAR T5 AND T6 (N/A )  09/14/2020   SPINAL FUSION N/A 02/12/2019   Procedure: DECOMPRESSIVE LAMINECTOMY THORACIC NINE-THORACIC TEN AND THORACIC TEN-THORACIC ELEVEN, EXTENSION OF THORACOLUMBAR FUSION FROM THORACIC TEN TO THORACIC FIVE, LOCAL BONE GRAFT, ALLOGRAFT AND VIVIGEN;  Surgeon: Jessy Oto, MD;  Location: [place];  Service: Orthopedics;  Laterality: N/A;   DECOMPRESSIVE LAMINECTOMY THORACIC NINE-THORACIC TEN AND THORACIC TEN-THORACIC ELEVEN, EXTENSION OF TH   SPINAL FUSION N/A 03/12/2019   Procedure: REVISION OF HARDWARE THORACIC TEN-THORACIC ELEVEN WITH APPLICATION OF ADDITIONAL RODS AND ROD SLEEVES THORACIC EIGHT-THORACIC TWELVE 12-LUMBAR ONE;  Surgeon: Jessy Oto, MD;  Location: [place];  Service: Orthopedics;  Laterality: N/A;   TONSILLECTOMY     as child   torn rotator cuff  2010   right   VIDEO BRONCHOSCOPY N/A 11/17/2020   Procedure: VIDEO BRONCHOSCOPY WITH FLUORO;  Surgeon: Laurin Coder, MD;  Location: [facility];  Service: Endoscopy;  Laterality: N/A;    Family History  Problem Relation Age of Onset   Hypertension Mother    Diabetes Mother        type 2   Cancer Mother 57       breast in remission   Emphysema Father        smoker   COPD Father        smoker   Stroke Father 103       mini   Heart disease Father    Hypertension Sister    Hyperlipidemia Sister    Scoliosis Sister    Osteoporosis Sister    Hypertension Maternal Grandmother    Scoliosis Maternal Grandmother    Heart disease Maternal Grandfather    Heart disease Paternal Grandfather        smoker   Heart disease Daughter     Allergies  Allergen Reactions   Daptomycin     Drug induced pneumonia     Current Outpatient Medications on File Prior to Visit  Medication Sig Dispense Refill   albuterol (PROVENTIL HFA) 108 (90 Base) MCG/ACT inhaler TAKE 2 PUFFS BY MOUTH EVERY 6 HOURS AS NEEDED FOR WHEEZE OR SHORTNESS OF BREATH 8 g 5   atorvastatin (LIPITOR) 10 MG tablet TAKE 1 TABLET BY MOUTH EVERY DAY 30 tablet 0   digoxin (LANOXIN) 0.125 MG tablet Take 1 tablet (0.125 mg total) by mouth daily. 90 tablet 3   ferrous fumarate (FERRETTS) 325 (106 Fe) MG TABS tablet Take 1 tablet (106 mg of iron total) by mouth daily. 90 tablet 3   glucose blood (CONTOUR NEXT TEST) test strip Check blood sugar once daily 100 each 12   JANUVIA 50 MG tablet TAKE 1 TABLET BY  MOUTH EVERY DAY 30 tablet 3   metFORMIN (GLUCOPHAGE) 1000 MG tablet Take  1 tablet (1,000 mg total) by mouth 2 (two) times daily with a meal. 180 tablet 0   metoprolol tartrate (LOPRESSOR) 100 MG tablet Take 1 tablet (100 mg total) two hours prior to CT testing 1 tablet 0   pantoprazole (PROTONIX) 40 MG tablet TAKE 1 TABLET BY MOUTH TWICE A DAY BEFORE A MEAL 180 tablet 1   pregabalin (LYRICA) 75 MG capsule TAKE 1 CAPSULE BY MOUTH TWICE A DAY 60 capsule 3   rivaroxaban (XARELTO) 20 MG TABS tablet Take 1 tablet (20 mg total) by mouth daily with supper. 90 tablet 3   sucralfate (CARAFATE) 1 g tablet TAKE 1 TABLET (1 G TOTAL) BY MOUTH 4 (FOUR) TIMES DAILY - WITH MEALS AND AT BEDTIME. 120 tablet 1   tamsulosin (FLOMAX) 0.4 MG CAPS capsule TAKE 2 CAPSULES BY MOUTH EVERY DAY 180 capsule 3   Tedizolid Phosphate 200 MG TABS Take 1 tablet by mouth daily. 30 tablet 1   Tedizolid Phosphate 200 MG TABS Take 1 tablet (200 mg) by mouth daily. 14 tablet 0   testosterone cypionate (DEPOTESTOSTERONE CYPIONATE) 200 MG/ML injection Inject 1 mL (200 mg total) into the muscle every 14 (fourteen) days. 2 mL 0   tiZANidine (ZANAFLEX) 4 MG tablet Take 1.5 tablets (6 mg total) by mouth every 6 (six) hours as needed for muscle spasms. 540 tablet 1   venlafaxine XR (EFFEXOR-XR) 150 MG 24 hr capsule TAKE 1 CAPSULE BY MOUTH DAILY WITH BREAKFAST. 90 capsule 1   zolpidem (AMBIEN) 5 MG tablet Take 1 tablet (5 mg total) by mouth at bedtime as needed. for sleep 14 tablet 0   Current Facility-Administered Medications on File Prior to Visit  Medication Dose Route Frequency Provider Last Rate Last Admin   testosterone cypionate (DEPOTESTOSTERONE CYPIONATE) injection 200 mg  200 mg Intramuscular Q14 Days Lilian Fuhs, PA-C   200 mg at 05/07/21 1040    BP 116/65   Pulse 60   Temp 98.2 F (36.8 C)   Resp 18   Ht 5\' 10"  (1.778 m)   Wt 167 lb (75.8 kg)   SpO2 90%   BMI 23.96 kg/m       Objective:   Physical  Exam  General Mental Status- Alert. General Appearance- Not in acute distress.   Skin General: Color- Normal Color. Moisture- Normal Moisture.  Neck Carotid Arteries- Normal color. Moisture- Normal Moisture. No carotid bruits. No JVD.  Chest and Lung Exam Auscultation: Breath Sounds:-Normal.  Cardiovascular Auscultation:Rythm- Regular. Murmurs & Other Heart Sounds:Auscultation of the heart reveals- No Murmurs.  Abdomen Inspection:-Inspeection Normal. Palpation/Percussion:Note:No mass. Palpation and Percussion of the abdomen reveal- Non Tender, Non Distended + BS, no rebound or guarding.   Neurologic Cranial Nerve exam:- CN III-XII intact(No nystagmus), symmetric smile. Strength:- 5/5 equal and symmetric strength both upper and lower extremities.   Skin- rt buttox. Small area 3 pink red raised area and skin feels dry. No vesicles.    Assessment & Plan:   Patient Instructions  Diabetes.  Continue metformin and Januvia.  Placing future A1c.  Hopefully cardiologist lab can draw the A1c when CBC and CMP are done prior to your ablation procedure.  Atrial fibrillation.  Continue Xarelto, digoxin and beta-blocker.   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  Continue antibiotics prescribed by infectious disease and continue to follow-up pain management.  COPD.  Presently clinically stable.  Continue on inhalers prescribed by pulmonologist.  Insomnia.  Ambien working about half a tab per your report.  We will continue  to refill Ambien as needed.  Hypertension.  Blood pressure well controlled today.  Low testosterone.  Continue with testosterone injections every 2 weeks.  Most recent CBC showed normal platelets and normal hemoglobin/hematocrit.   Mackie Pai, PA-C

## 2021-05-19 NOTE — Telephone Encounter (Signed)
No contract or  [employer] , you first prescribed medication 04/23/21 , via [employer]

## 2021-05-20 MED ORDER — ZOLPIDEM TARTRATE 5 MG PO TABS: 5.0000 mg | ORAL_TABLET | Freq: Every evening | ORAL | 0 refills | Status: DC | PRN

## 2021-05-21 ENCOUNTER — Ambulatory Visit (HOSPITAL_COMMUNITY): Admission: RE | Admit: 2021-05-21 | Payer: Medicare Other | Source: Ambulatory Visit

## 2021-05-21 ENCOUNTER — Other Ambulatory Visit (HOSPITAL_BASED_OUTPATIENT_CLINIC_OR_DEPARTMENT_OTHER): Payer: Self-pay

## 2021-05-21 MED ORDER — PFIZER COVID-19 VAC BIVALENT 30 MCG/0.3ML IM SUSP
INTRAMUSCULAR | 0 refills | Status: DC
  Filled 2021-05-21: qty 0.3, 1d supply, fill #0

## 2021-05-22 ENCOUNTER — Other Ambulatory Visit: Payer: Self-pay | Admitting: Medical

## 2021-05-23 ENCOUNTER — Other Ambulatory Visit: Payer: Self-pay | Admitting: Medical

## 2021-05-24 ENCOUNTER — Encounter: Payer: Self-pay | Admitting: Medical

## 2021-05-24 MED ORDER — TAMSULOSIN HCL 0.4 MG PO CAPS: 0.8000 mg | ORAL_CAPSULE | Freq: Every day | ORAL | 3 refills | Status: DC

## 2021-05-25 ENCOUNTER — Other Ambulatory Visit (HOSPITAL_COMMUNITY): Payer: Self-pay

## 2021-05-25 ENCOUNTER — Other Ambulatory Visit: Payer: Self-pay

## 2021-05-25 ENCOUNTER — Ambulatory Visit (INDEPENDENT_AMBULATORY_CARE_PROVIDER_SITE_OTHER): Payer: Medicare Other

## 2021-05-25 DIAGNOSIS — R7989 Other specified abnormal findings of blood chemistry: Secondary | ICD-10-CM | POA: Diagnosis not present

## 2021-05-25 MED ORDER — TESTOSTERONE CYPIONATE 200 MG/ML IM SOLN
200.0000 mg | INTRAMUSCULAR | Status: DC
  Administered 2021-05-25: 200 mg via INTRAMUSCULAR

## 2021-05-25 NOTE — Progress Notes (Addendum)
Luis Brown is a 75 y.o. male presents to the office today for Testosterone injections, per physician's orders. Testosterone Cypionate 200mg /ml was administered IM in right upper outer quadrant today. Patient tolerated injection. Patient next injection due: 16 days, appt made yes.   Leanora Ivanoff, [place]

## 2021-05-25 NOTE — Telephone Encounter (Signed)
Patient called office to follow up on antibiotic plan. States that he only has two days left of oral antibiotics. Would like to know when he can start IV home infusion. Spoke with Cassie, Pharmacist who has reached out to [facility] with [facility] to call pt back to help coordinate hh.  Leatrice Jewels, RMA

## 2021-05-26 ENCOUNTER — Other Ambulatory Visit: Payer: Self-pay | Admitting: Pharmacist

## 2021-05-26 ENCOUNTER — Other Ambulatory Visit: Payer: Self-pay | Admitting: Medical

## 2021-05-26 NOTE — Progress Notes (Signed)
Patient starting IV abx

## 2021-05-27 ENCOUNTER — Telehealth: Payer: Self-pay | Admitting: Pharmacist

## 2021-05-27 MED ORDER — VERAPAMIL HCL 2.5 MG/ML IV SOLN
INTRAVENOUS | Status: AC
  Filled 2021-05-27: qty 2

## 2021-05-27 MED ORDER — LIDOCAINE HCL (PF) 1 % IJ SOLN
INTRAMUSCULAR | Status: AC
  Filled 2021-05-27: qty 30

## 2021-05-27 MED ORDER — HEPARIN SODIUM (PORCINE) 1000 UNIT/ML IJ SOLN
INTRAMUSCULAR | Status: AC
  Filled 2021-05-27: qty 1

## 2021-05-27 MED ORDER — HEPARIN (PORCINE) IN NACL 1000-0.9 UT/500ML-% IV SOLN
INTRAVENOUS | Status: AC
  Filled 2021-05-27: qty 500

## 2021-05-27 NOTE — Telephone Encounter (Signed)
Wrote orders and faxed to [facility] pharmacy for patient's treatment. He will start Dalvance on Friday 11/11. He is to get 1500 mg IV x 1 then 1000 mg IV q2 weeks via peripheral sticks from [facility]. CBC, BMP, ESR, CRP q2 weeks faxed to [phone].

## 2021-05-28 DIAGNOSIS — M4325 Fusion of spine, thoracolumbar region: Secondary | ICD-10-CM | POA: Diagnosis not present

## 2021-05-28 DIAGNOSIS — G062 Extradural and subdural abscess, unspecified: Secondary | ICD-10-CM | POA: Diagnosis not present

## 2021-05-28 DIAGNOSIS — M4635 Infection of intervertebral disc (pyogenic), thoracolumbar region: Secondary | ICD-10-CM | POA: Diagnosis not present

## 2021-05-31 ENCOUNTER — Other Ambulatory Visit: Payer: Self-pay

## 2021-05-31 ENCOUNTER — Encounter: Payer: Self-pay | Admitting: Specialist

## 2021-05-31 ENCOUNTER — Ambulatory Visit: Payer: Self-pay

## 2021-05-31 ENCOUNTER — Ambulatory Visit (INDEPENDENT_AMBULATORY_CARE_PROVIDER_SITE_OTHER): Payer: Medicare Other | Admitting: Specialist

## 2021-05-31 VITALS — BP 153/55 | HR 76 | Ht 70.0 in | Wt 167.0 lb

## 2021-05-31 DIAGNOSIS — M4620 Osteomyelitis of vertebra, site unspecified: Secondary | ICD-10-CM

## 2021-05-31 DIAGNOSIS — M4325 Fusion of spine, thoracolumbar region: Secondary | ICD-10-CM | POA: Diagnosis not present

## 2021-05-31 DIAGNOSIS — M546 Pain in thoracic spine: Secondary | ICD-10-CM | POA: Diagnosis not present

## 2021-05-31 DIAGNOSIS — M5414 Radiculopathy, thoracic region: Secondary | ICD-10-CM | POA: Diagnosis not present

## 2021-05-31 NOTE — Patient Instructions (Addendum)
  Plan: Avoid frequent bending and stooping  No lifting greater than 10 lbs. May use ice or moist heat for pain. Weight loss is of benefit. Brace may use as needed for mid back pain.  Exercise is important to improve your indurance and does allow people to function better inspite of back pain. Continue with pain management.  Will follow up in 3 weeks.

## 2021-05-31 NOTE — Progress Notes (Signed)
Office Visit Note   Patient: Luis Brown           Date of Birth: [birth year]           MRN: [id number] Visit Date: 05/31/2021              Requested by: Mackie Pai, PA-C [address],  [place] [address] PCP: Mackie Pai, PA-C   Assessment & Plan: Visit Diagnoses:  1. Thoracic radiculopathy       Plan: Avoid frequent bending and stooping  No lifting greater than 10 lbs. May use ice or moist heat for pain. Weight loss is of benefit. Go to [employer] and orthotics to enquire about the brace previously ordered may use as needed for mid back pain.  Exercise is important to improve your indurance and does allow people to function better inspite of back pain.  Continue with pain management.  Will follow up in 3 weeks.   Follow-Up Instructions: No follow-ups on file.   Orders:  Orders Placed This Encounter  Procedures   XR Thoracic Spine 2 View   No orders of the defined types were placed in this encounter.     Procedures: No procedures performed   Clinical Data: No additional findings.   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  He reports back spasms are Improved. He has some balance and coordination difficulties. ID was concerned about a phlegmon at the T6-7 level. Apparently the laboratory studies are improving. I considered a laminectomy as a possibility but the results of his recent CT scan suggest that if a laminectomy was done Kyphosis would likely occur unless a reinstrumentation was performs however I would not recommend reintroduction of hardware. The area of removal of hardware at T4 and T5 seems to  Have healed with the removal of the instrumentation. There MRI done shows no fluid collection and the phlegmon that is scar and enhances with contrast this  suggests that surgery may not be of much help and the risk is instability and loss of function. He is clinically improving and seeing return of function.  Last seen 9/15, he is feeling better and is now able to walk while traveling to the beach this past week. Even did some fishing on the beach. He has neuropathy but he reports his balance is better. Lab have shown improvement in most parameters. He uses a figure of 8 upper thoracic brace intermittantly.    Review of Systems  Constitutional: Negative.   HENT: Negative.    Eyes: Negative.   Respiratory: Negative.    Cardiovascular: Negative.   Gastrointestinal: Negative.   Endocrine: Negative.   Genitourinary: Negative.   Musculoskeletal: Negative.   Skin: Negative.   Allergic/Immunologic: Negative.   Neurological: Negative.   Hematological: Negative.   Psychiatric/Behavioral: Negative.      Objective: Vital Signs: BP (!) 153/55 (BP Location: Left Arm, Patient Position: Sitting)   Pulse 76   Ht 5\' 10"  (1.778 m)   Wt 167 lb (75.8 kg)   BMI 23.96 kg/m   Physical Exam Constitutional:      Appearance: He is well-developed.  HENT:     Head: Normocephalic and atraumatic.  Eyes:     Pupils: Pupils are equal, round, and reactive to light.  Pulmonary:     Effort:  Pulmonary effort is normal.     Breath sounds: Normal breath sounds.  Abdominal:     General: Bowel sounds are normal.     Palpations: Abdomen is soft.  Musculoskeletal:     Cervical back: Normal range of motion and neck supple.  Skin:    General: Skin is warm and dry.  Neurological:     Mental Status: He is alert and oriented to person, place, and time.  Psychiatric:        Behavior: Behavior normal.        Thought Content: Thought content normal.        Judgment: Judgment normal.    Back Exam   Tenderness  The patient is experiencing tenderness in the lumbar.  Range of Motion  Extension:  60 abnormal  Flexion:  60 abnormal  Lateral bend right:   abnormal  Lateral bend left:  abnormal  Rotation right:  abnormal  Rotation left:  abnormal   Muscle Strength  Right Quadriceps:  5/5  Left Quadriceps:  5/5  Right Hamstrings:  5/5   Reflexes  Patellar:  0/4 Achilles:  0/4 Biceps:  0/4  Other  Toe walk: abnormal Heel walk: normal Erythema: no back redness Scars: absent  Comments:  Weak with toe walking.       Specialty Comments:  No specialty comments available.  Imaging: No results found.   PMFS History: Patient Active Problem List   Diagnosis Date Noted   COPD (chronic obstructive pulmonary disease) ([place]) 02/10/2021   Palpitations 02/01/2021   Wears glasses 01/11/2021   Pneumothorax, right 12/15/2020   Anxiety 12/01/2020   SOB (shortness of breath)    DVT (deep venous thrombosis) ([place]) 11/16/2020   Acute respiratory failure with hypoxia ([place]) 11/14/2020   Fluid collection at surgical site    Wound infection    Sepsis ([place]) 11/11/2020   Drug-induced pneumonitis 11/10/2020   S/P bronchoscopy 09/14/2020   Hyperglycemia 03/22/2020   Gastric ulcer 01/27/2020   H/O colonoscopy with polypectomy 01/27/2020   Protein-calorie malnutrition, severe 01/09/2020   Weight loss 01/07/2020   Abdominal pain 12/30/2019   Nausea and vomiting 12/30/2019   Weight loss, abnormal 12/30/2019   Change in bowel habits 12/30/2019   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   Medication monitoring encounter 08/26/2019   Peripheral neuropathy 08/07/2019   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[place]) 04/04/2019   Fusion of spine of thoracolumbar region 03/12/2019   Status post thoracic spinal fusion 02/12/2019   Thoracic spinal stenosis 04/17/2017   Painful orthopaedic hardware ([place]) 04/17/2017   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 ([place]) 04/17/2017   Low back pain radiating to both legs 01/16/2017   Esophageal reflux 02/10/2015   Essential hypertension 06/25/2014   Asthma 06/25/2014    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   Overweight 06/25/2014   Other abnormal glucose 06/25/2014   Spinal stenosis of lumbar region without neurogenic claudication 04/09/2014   Flatback syndrome 04/09/2014   Chicken pox 10/21/2013   History of colonic polyps 10/27/2012   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   Benign enlargement of prostate 04/23/2012   ED (erectile dysfunction) 04/23/2012   Allergic state 10/25/2011   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   Diabetes mellitus without complication (HCC)    Insomnia    Fatigue    Preventative health care    DDD (degenerative disc disease), lumbosacral    Thoracic degenerative disc disease    Past Medical History:  Diagnosis Date  Acute pharyngitis 10/21/2013   Allergy    grass and pollen   Anxiety and depression 10/25/2011   BPH (benign prostatic hyperplasia) 04/23/2012   Chicken pox as a child   DDD (degenerative disc disease)    cervical responds to steroid injections and low back required surgery   DDD (degenerative disc disease), lumbosacral    Diabetes mellitus    pre   Dyspnea    ED (erectile dysfunction) 04/23/2012   Elevated BP    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   Esophageal reflux 02/10/2015   Fatigue    HTN (hypertension)    Hyperglycemia    preDM    Hyperlipidemia    Insomnia    Low back pain radiating to both legs 01/16/2017   Measles as a child   Overweight(278.02)    Peripheral neuropathy 08/07/2019   Personal history of colonic polyps 10/27/2012   Follows with [facility]   Pneumonia    " walking"   Preventative health care    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   Wears glasses     Family History  Problem Relation Age of Onset   Hypertension Mother    Diabetes Mother        type 2   Cancer Mother 47       breast in remission   Emphysema Father        smoker   COPD Father        smoker   Stroke Father 53       mini   Heart disease Father     Hypertension Sister    Hyperlipidemia Sister    Scoliosis Sister    Osteoporosis Sister    Hypertension Maternal Grandmother    Scoliosis Maternal Grandmother    Heart disease Maternal Grandfather    Heart disease Paternal Grandfather        smoker   Heart disease Daughter     Past Surgical History:  Procedure Laterality Date   BACK SURGERY  2012 and 1994   Dr Margret Chance, screws and cage in low back   BIOPSY  01/09/2020   Procedure: BIOPSY;  Surgeon: Otis Brace, MD;  Location: [facility];  Service: Gastroenterology;;   BRONCHIAL BIOPSY  11/17/2020   Procedure: BRONCHIAL BIOPSIES;  Surgeon: Laurin Coder, MD;  Location: [facility];  Service: Endoscopy;;   BRONCHIAL BRUSHINGS  11/17/2020   Procedure: BRONCHIAL BRUSHINGS;  Surgeon: Laurin Coder, MD;  Location: [facility];  Service: Endoscopy;;   BRONCHIAL WASHINGS  11/17/2020   Procedure: BRONCHIAL WASHINGS;  Surgeon: Laurin Coder, MD;  Location: [facility];  Service: Endoscopy;;   COLONOSCOPY WITH PROPOFOL N/A 01/09/2020   Procedure: COLONOSCOPY WITH PROPOFOL;  Surgeon: Otis Brace, MD;  Location: [facility];  Service: Gastroenterology;  Laterality: N/A;   ESOPHAGOGASTRODUODENOSCOPY (EGD) WITH PROPOFOL N/A 01/09/2020   Procedure: ESOPHAGOGASTRODUODENOSCOPY (EGD) WITH PROPOFOL;  Surgeon: Otis Brace, MD;  Location: [facility];  Service: Gastroenterology;  Laterality: N/A;   EYE SURGERY Bilateral    2016   HARDWARE REVISION  03/12/2019   REVISION OF HARDWARE THORACIC TEN-THORACIC ELEVEN WITH APPLICATION OF ADDITIONAL RODS AND ROD SLEEVES THORACIC EIGHT-THORACIC TWELVE 12-LUMBAR ONE (N/A )   HEMOSTASIS CONTROL  11/17/2020   Procedure: HEMOSTASIS CONTROL;  Surgeon: Laurin Coder, MD;  Location: [facility];  Service: Endoscopy;;   IR US GUIDE BX ASP/DRAIN  11/13/2020   LAMINECTOMY  02/12/2019   DECOMPRESSIVE LAMINECTOMY THORACIC NINE-THORACIC TEN AND THORACIC TEN-THORACIC ELEVEN, EXTENSION OF  THORACOLUMBAR FUSION FROM THORACIC TEN TO  THORACIC FIVE, LOCAL BONE GRAFT, ALLOGRAFT AND VIVIGEN (N/A)   POLYPECTOMY  01/09/2020   Procedure: POLYPECTOMY;  Surgeon: Otis Brace, MD;  Location: [facility];  Service: Gastroenterology;;   REMOVAL OF RODS AND PEDICLE SCREWS T5 AND T6, EXPLORATION OF FUSION, BIOPSY TRANSPEDICULAR T5 AND T6 (N/A )  09/14/2020   SPINAL FUSION N/A 02/12/2019   Procedure: DECOMPRESSIVE LAMINECTOMY THORACIC NINE-THORACIC TEN AND THORACIC TEN-THORACIC ELEVEN, EXTENSION OF THORACOLUMBAR FUSION FROM THORACIC TEN TO THORACIC FIVE, LOCAL BONE GRAFT, ALLOGRAFT AND VIVIGEN;  Surgeon: Jessy Oto, MD;  Location: [facility];  Service: Orthopedics;  Laterality: N/A;  DECOMPRESSIVE LAMINECTOMY THORACIC NINE-THORACIC TEN AND THORACIC TEN-THORACIC ELEVEN, EXTENSION OF TH   SPINAL FUSION N/A 03/12/2019   Procedure: REVISION OF HARDWARE THORACIC TEN-THORACIC ELEVEN WITH APPLICATION OF ADDITIONAL RODS AND ROD SLEEVES THORACIC EIGHT-THORACIC TWELVE 12-LUMBAR ONE;  Surgeon: Jessy Oto, MD;  Location: [facility];  Service: Orthopedics;  Laterality: N/A;   TONSILLECTOMY     as child   torn rotator cuff  2010   right   VIDEO BRONCHOSCOPY N/A 11/17/2020   Procedure: VIDEO BRONCHOSCOPY WITH FLUORO;  Surgeon: Laurin Coder, MD;  Location: [facility];  Service: Endoscopy;  Laterality: N/A;   Social History   Occupational History   Not on file  Tobacco Use   Smoking status: Former    Packs/day: 1.00    Years: 20.00    Pack years: 20.00    Types: Cigarettes    Quit date: 07/18/1989    Years since quitting: 31.8   Smokeless tobacco: Never  Vaping Use   Vaping Use: Never used  Substance and Sexual Activity   Alcohol use: Not Currently   Drug use: No   Sexual activity: Yes    Comment: lives with wife, still working, no dietary restrictions, continues to exercise intermittently

## 2021-06-01 ENCOUNTER — Encounter: Payer: Self-pay | Admitting: Medical

## 2021-06-02 ENCOUNTER — Other Ambulatory Visit (HOSPITAL_COMMUNITY): Payer: Self-pay

## 2021-06-02 ENCOUNTER — Telehealth: Payer: Self-pay | Admitting: Medical

## 2021-06-02 MED ORDER — ZOLPIDEM TARTRATE 5 MG PO TABS: 5.0000 mg | ORAL_TABLET | Freq: Every evening | ORAL | 0 refills | Status: DC | PRN

## 2021-06-02 NOTE — Telephone Encounter (Signed)
Refilled pt ambien.

## 2021-06-07 DIAGNOSIS — L570 Actinic keratosis: Secondary | ICD-10-CM | POA: Diagnosis not present

## 2021-06-07 DIAGNOSIS — L309 Dermatitis, unspecified: Secondary | ICD-10-CM | POA: Diagnosis not present

## 2021-06-09 ENCOUNTER — Ambulatory Visit: Payer: Medicare Other | Admitting: Internal Medicine

## 2021-06-11 DIAGNOSIS — G062 Extradural and subdural abscess, unspecified: Secondary | ICD-10-CM | POA: Diagnosis not present

## 2021-06-11 DIAGNOSIS — M4635 Infection of intervertebral disc (pyogenic), thoracolumbar region: Secondary | ICD-10-CM | POA: Diagnosis not present

## 2021-06-11 DIAGNOSIS — M4325 Fusion of spine, thoracolumbar region: Secondary | ICD-10-CM | POA: Diagnosis not present

## 2021-06-12 DIAGNOSIS — M4325 Fusion of spine, thoracolumbar region: Secondary | ICD-10-CM | POA: Diagnosis not present

## 2021-06-12 DIAGNOSIS — M4635 Infection of intervertebral disc (pyogenic), thoracolumbar region: Secondary | ICD-10-CM | POA: Diagnosis not present

## 2021-06-14 ENCOUNTER — Ambulatory Visit: Payer: Medicare Other | Admitting: Internal Medicine

## 2021-06-14 NOTE — Progress Notes (Deleted)
[facility] for Infectious Disease  CHIEF COMPLAINT:    Follow up for back infection  SUBJECTIVE:    Luis Brown is a 75 y.o. male with PMHx as below who presents to the clinic for back infection.   Luis Brown presents today for routine follow-up.  He has a long history of spinal infection as noted in my note from 03/17/2021.  He was last seen by me on 04/07/2021.  During that interval he was evaluated by neurosurgery on 05/17/2021 who recommended no surgical options at this time.  He completed 10 days of linezolid + 8 weeks of tedizolid for his spinal infection and has now been started on dalbavancin infusions every 2 weeks earlier this month as a suppressive regimen.  His recent OP AT labs from 05/28/2021 show a normal WBC, creatinine 0.6, ESR 8, and CRP 3.  Thus far, he seems to be tolerating the dalbavancin infusions.  He was seen by his orthopedic surgeon on 11/14 and reported feeling somewhat better.  He traveled to the beach and was able to do some walking and fishing.  He continues to have some neuropathy but reports that his balance is better.  He continues to use his back brace intermittently.  Please see A&P for the details of today's visit and status of the patient's medical problems.   Patient's Medications  New Prescriptions   No medications on file  Previous Medications   ALBUTEROL (PROVENTIL HFA) 108 (90 BASE) MCG/ACT INHALER    TAKE 2 PUFFS BY MOUTH EVERY 6 HOURS AS NEEDED FOR WHEEZE OR SHORTNESS OF BREATH   ATORVASTATIN (LIPITOR) 10 MG TABLET    TAKE 1 TABLET BY MOUTH EVERY DAY   COVID-19 MRNA BIVALENT VACCINE, PFIZER, (PFIZER COVID-19 VAC BIVALENT) INJECTION    Inject into the muscle.   DIGOXIN (LANOXIN) 0.125 MG TABLET    Take 1 tablet (0.125 mg total) by mouth daily.   FERROUS FUMARATE (FERRETTS) 325 (106 FE) MG TABS TABLET    Take 1 tablet (106 mg of iron total) by mouth daily.   GLUCOSE BLOOD (CONTOUR NEXT TEST) TEST STRIP    Check blood sugar once daily    JANUVIA 50 MG TABLET    TAKE 1 TABLET BY MOUTH EVERY DAY   METFORMIN (GLUCOPHAGE) 1000 MG TABLET    TAKE 1 TABLET (1,000 MG TOTAL) BY MOUTH 2 (TWO) TIMES DAILY WITH A MEAL.   METOPROLOL TARTRATE (LOPRESSOR) 100 MG TABLET    Take 1 tablet (100 mg total) two hours prior to CT testing   PANTOPRAZOLE (PROTONIX) 40 MG TABLET    TAKE 1 TABLET BY MOUTH TWICE A DAY BEFORE A MEAL   PREGABALIN (LYRICA) 75 MG CAPSULE    TAKE 1 CAPSULE BY MOUTH TWICE A DAY   RIVAROXABAN (XARELTO) 20 MG TABS TABLET    Take 1 tablet (20 mg total) by mouth daily with supper.   SUCRALFATE (CARAFATE) 1 G TABLET    TAKE 1 TABLET (1 G TOTAL) BY MOUTH 4 (FOUR) TIMES DAILY - WITH MEALS AND AT BEDTIME.   TAMSULOSIN (FLOMAX) 0.4 MG CAPS CAPSULE    Take 2 capsules (0.8 mg total) by mouth daily.   TEDIZOLID PHOSPHATE 200 MG TABS    Take 1 tablet by mouth daily.   TEDIZOLID PHOSPHATE 200 MG TABS    Take 1 tablet (200 mg) by mouth daily.   TESTOSTERONE CYPIONATE (DEPOTESTOSTERONE CYPIONATE) 200 MG/ML INJECTION    INJECT 1 ML (200 MG  TOTAL) INTO THE MUSCLE EVERY 14 DAYS   TIZANIDINE (ZANAFLEX) 4 MG TABLET    Take 1.5 tablets (6 mg total) by mouth every 6 (six) hours as needed for muscle spasms.   VENLAFAXINE XR (EFFEXOR-XR) 150 MG 24 HR CAPSULE    TAKE 1 CAPSULE BY MOUTH DAILY WITH BREAKFAST.   ZOLPIDEM (AMBIEN) 5 MG TABLET    Take 1 tablet (5 mg total) by mouth at bedtime as needed. 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   Esophageal reflux 02/10/2015   Fatigue    HTN (hypertension)    Hyperglycemia    preDM    Hyperlipidemia    Insomnia    Low back pain radiating to both legs 01/16/2017   Measles as a child   Overweight(278.02)    Peripheral neuropathy 08/07/2019   Personal history of colonic polyps 10/27/2012   Follows with [facility]   Pneumonia    " walking"   Preventative health care    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   Wears glasses     Social History   Tobacco Use   Smoking status: Former    Packs/day: 1.00    Years: 20.00    Pack years: 20.00    Types: Cigarettes    Quit date: 07/18/1989    Years since quitting: 31.9   Smokeless tobacco: Never  Vaping Use   Vaping Use: Never used  Substance Use Topics   Alcohol use: Not Currently   Drug use: No    Family History  Problem Relation Age of Onset   Hypertension Mother    Diabetes Mother        type 2   Cancer Mother 27       breast in remission   Emphysema Father        smoker   COPD Father        smoker   Stroke Father 90       mini   Heart disease Father    Hypertension Sister    Hyperlipidemia Sister    Scoliosis Sister    Osteoporosis Sister    Hypertension Maternal Grandmother    Scoliosis Maternal Grandmother    Heart disease Maternal Grandfather    Heart disease Paternal Grandfather        smoker   Heart disease Daughter     Allergies  Allergen Reactions   Daptomycin     Drug induced pneumonia     ROS   OBJECTIVE:    There were no vitals filed for this visit. There is no height or weight on file to calculate BMI.  Physical Exam   Labs and Microbiology: CBC Latest Ref Rng & Units 04/07/2021 03/15/2021 01/07/2021  WBC 3.8 - 10.8 Thousand/uL 8.5 7.5 8.9  Hemoglobin 13.2 - 17.1 g/dL 13.7 14.0 11.8(L)  Hematocrit 38.5 - 50.0 %  41.9 43.5 35.4(L)  Platelets 140 - 400 Thousand/uL 341 397 450.0(H)   CMP Latest Ref Rng & Units 03/15/2021 02/01/2021  01/05/2021  Glucose 65 - 99 mg/dL 96 115(H) 187(H)  BUN 7 - 25 mg/dL _0 Creatinine 0.70 - 1.28 mg/dL 0.66(L) 0.63(L) 0.56  Sodium 135 - 146 mmol/L 136 138 134(L)  Potassium 3.5 - 5.3 mmol/L 4.0 4.4 4.5  Chloride 98 - 110 mmol/L 96(L) 98 95(L)  CO2 20 - 32 mmol/L _1 Calcium 8.6 - 10.3 mg/dL 9.6 9.5 9.3  Total Protein 6.1 - 8.1 g/dL 6.5 - 5.9(L)  Total Bilirubin 0.2 - 1.2 mg/dL 0.5 - 0.9  Alkaline Phos 39 - 117 U/L - - 65  AST 10 - 35 U/L 14 - 15  ALT 9 - 46 U/L 7(L) - 21       ASSESSMENT & PLAN:    No problem-specific Assessment & Plan notes found for this encounter.   No orders of the defined types were placed in this encounter.    There are no diagnoses linked to this encounter.  Patient completed 8 weeks of tedizolid (plus 10 days linezolid) for his complicated spinal infection with hardware as documented in my note from 03/17/2021.  He was started on dalbavancin 1500 mg IV x1 dose then 1000 mg IV every 2 weeks [facility].  This suppressive regimen was initiated on 05/28/2021.  His inflammatory markers from that date were improved and normalized.  His back pain is stable and he is overall doing well.  We will continue suppressive dalbavancin for now.  Anticipate a prolonged course of therapy as long as he continues to tolerate.  Continue labs with every 2-week infusions and follow-up in 3 months.   Raynelle Highland for Infectious Disease [facility] 06/14/2021, 7:51 AM

## 2021-06-16 DIAGNOSIS — M150 Primary generalized (osteo)arthritis: Secondary | ICD-10-CM | POA: Diagnosis not present

## 2021-06-16 DIAGNOSIS — M961 Postlaminectomy syndrome, not elsewhere classified: Secondary | ICD-10-CM | POA: Diagnosis not present

## 2021-06-16 DIAGNOSIS — G894 Chronic pain syndrome: Secondary | ICD-10-CM | POA: Diagnosis not present

## 2021-06-16 DIAGNOSIS — M4624 Osteomyelitis of vertebra, thoracic region: Secondary | ICD-10-CM | POA: Diagnosis not present

## 2021-06-17 ENCOUNTER — Encounter: Payer: Self-pay | Admitting: Medical

## 2021-06-17 ENCOUNTER — Ambulatory Visit (INDEPENDENT_AMBULATORY_CARE_PROVIDER_SITE_OTHER): Payer: Medicare Other | Admitting: *Deleted

## 2021-06-17 DIAGNOSIS — R7989 Other specified abnormal findings of blood chemistry: Secondary | ICD-10-CM

## 2021-06-17 MED ORDER — TESTOSTERONE CYPIONATE 200 MG/ML IM SOLN
200.0000 mg | Freq: Once | INTRAMUSCULAR | Status: AC
  Administered 2021-06-17: 200 mg via INTRAMUSCULAR

## 2021-06-17 NOTE — Progress Notes (Signed)
Luis Brown is a 75 y.o. male presents to the office today for Testosterone injections, per physician's orders.  Testosterone Cypionate 200mg /ml was administered IM in right upper outer quadrant today. Patient tolerated injection.  Patient next injection due: 16 days, appt made yes

## 2021-06-17 NOTE — Telephone Encounter (Signed)
Requesting: Luis Brown  Contract: n/a UDS: n/a Last Visit: 11/1/222 Next Visit:n/a Last Refill:06/02/21  Please Advise

## 2021-06-18 ENCOUNTER — Encounter: Payer: Self-pay | Admitting: Medical

## 2021-06-18 ENCOUNTER — Telehealth: Payer: Self-pay | Admitting: Medical

## 2021-06-18 ENCOUNTER — Other Ambulatory Visit: Payer: Self-pay | Admitting: Medical

## 2021-06-18 MED ORDER — ZOLPIDEM TARTRATE 5 MG PO TABS: 5.0000 mg | ORAL_TABLET | Freq: Every evening | ORAL | 0 refills | Status: DC | PRN

## 2021-06-18 MED ORDER — SITAGLIPTIN PHOSPHATE 50 MG PO TABS
50.0000 mg | ORAL_TABLET | Freq: Every day | ORAL | 3 refills | Status: DC
Start: 2021-06-18 — End: 2021-11-24

## 2021-06-18 NOTE — Telephone Encounter (Signed)
Chart opened to rx med, order lab, review chart, respond to my chart message or send message to staff member  

## 2021-06-23 ENCOUNTER — Ambulatory Visit (INDEPENDENT_AMBULATORY_CARE_PROVIDER_SITE_OTHER): Payer: Medicare Other | Admitting: Internal Medicine

## 2021-06-23 ENCOUNTER — Other Ambulatory Visit: Payer: Self-pay | Admitting: Medical

## 2021-06-23 ENCOUNTER — Other Ambulatory Visit: Payer: Self-pay

## 2021-06-23 ENCOUNTER — Encounter: Payer: Self-pay | Admitting: Internal Medicine

## 2021-06-23 VITALS — BP 147/88 | HR 91 | Temp 97.7°F | Wt 173.0 lb

## 2021-06-23 DIAGNOSIS — M4624 Osteomyelitis of vertebra, thoracic region: Secondary | ICD-10-CM | POA: Diagnosis not present

## 2021-06-23 NOTE — Progress Notes (Signed)
[facility]  CHIEF COMPLAINT:    Follow up for back infection  SUBJECTIVE:    Luis Brown is a 75 y.o. male with PMHx as below who presents to the clinic for back infection.   Luis Brown presents today for routine follow-up.  He has a long history of spinal infection as noted in my note from 03/17/2021.  He was last seen by me on 04/07/2021.  During that interval he was evaluated by neurosurgery on 05/17/2021 who recommended no surgical options at this time.    He completed 10 days of linezolid + 8 weeks of tedizolid for his spinal infection and has now been started on dalbavancin infusions every 2 weeks last month as a suppressive regimen.  His recent OP AT labs from 05/28/2021 show a normal WBC, creatinine 0.6, ESR 8, and CRP 3.  Thus far, he seems to be tolerating the dalbavancin infusions.  He was seen by his orthopedic surgeon on 11/14 and reported feeling somewhat better.  He traveled to the beach and was able to do some walking and fishing.    Please see A&P for the details of today's visit and status of the patient's medical problems.   Patient's Medications  New Prescriptions   No medications on file  Previous Medications   ALBUTEROL (PROVENTIL HFA) 108 (90 BASE) MCG/ACT INHALER    TAKE 2 PUFFS BY MOUTH EVERY 6 HOURS AS NEEDED FOR WHEEZE OR SHORTNESS OF BREATH   ATORVASTATIN (LIPITOR) 10 MG TABLET    TAKE 1 TABLET BY MOUTH EVERY DAY   COVID-19 MRNA BIVALENT VACCINE, PFIZER, (PFIZER COVID-19 VAC BIVALENT) INJECTION    Inject into the muscle.   DALVANCE 500 MG SOLR    Inject into the vein.   DIGOXIN (LANOXIN) 0.125 MG TABLET    Take 1 tablet (0.125 mg total) by mouth daily.   FERROUS FUMARATE (FERRETTS) 325 (106 FE) MG TABS TABLET    Take 1 tablet (106 mg of iron total) by mouth daily.   GLUCOSE BLOOD (CONTOUR NEXT TEST) TEST STRIP    Check blood sugar once daily   METFORMIN (GLUCOPHAGE) 1000 MG TABLET    TAKE 1 TABLET (1,000 MG TOTAL) BY MOUTH  2 (TWO) TIMES DAILY WITH A MEAL.   METOPROLOL TARTRATE (LOPRESSOR) 100 MG TABLET    Take 1 tablet (100 mg total) two hours prior to CT testing   OXYCODONE-ACETAMINOPHEN (PERCOCET/ROXICET) 5-325 MG TABLET    Take 1 tablet by mouth every 4 (four) hours as needed.   PANTOPRAZOLE (PROTONIX) 40 MG TABLET    TAKE 1 TABLET BY MOUTH TWICE A DAY BEFORE A MEAL   PREGABALIN (LYRICA) 75 MG CAPSULE    TAKE 1 CAPSULE BY MOUTH TWICE A DAY   RIVAROXABAN (XARELTO) 20 MG TABS TABLET    Take 1 tablet (20 mg total) by mouth daily with supper.   SITAGLIPTIN (JANUVIA) 50 MG TABLET    Take 1 tablet (50 mg total) by mouth daily.   STIOLTO RESPIMAT 2.5-2.5 MCG/ACT AERS    SMARTSIG:2 Puff(s) Via Inhaler Daily   SUCRALFATE (CARAFATE) 1 G TABLET    TAKE 1 TABLET (1 G TOTAL) BY MOUTH 4 (FOUR) TIMES DAILY - WITH MEALS AND AT BEDTIME.   TAMSULOSIN (FLOMAX) 0.4 MG CAPS CAPSULE    Take 2 capsules (0.8 mg total) by mouth daily.   TESTOSTERONE CYPIONATE (DEPOTESTOSTERONE CYPIONATE) 200 MG/ML INJECTION    INJECT 1 ML (200 MG TOTAL) INTO  THE MUSCLE EVERY 14 DAYS   TIZANIDINE (ZANAFLEX) 4 MG TABLET    Take 1.5 tablets (6 mg total) by mouth every 6 (six) hours as needed for muscle spasms.   VENLAFAXINE XR (EFFEXOR-XR) 150 MG 24 HR CAPSULE    TAKE 1 CAPSULE BY MOUTH DAILY WITH BREAKFAST.   ZOLPIDEM (AMBIEN) 5 MG TABLET    Take 1 tablet (5 mg total) by mouth at bedtime as needed. for sleep  Modified Medications   No medications on file  Discontinued Medications   TEDIZOLID PHOSPHATE 200 MG TABS    Take 1 tablet by mouth daily.   TEDIZOLID PHOSPHATE 200 MG TABS    Take 1 tablet (200 mg) by mouth daily.      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   Esophageal reflux 02/10/2015   Fatigue    HTN (hypertension)    Hyperglycemia    preDM    Hyperlipidemia    Insomnia    Low back pain radiating to both legs 01/16/2017   Measles as a child   Overweight(278.02)    Peripheral neuropathy 08/07/2019   Personal history of colonic polyps 10/27/2012   Follows with [facility]   Pneumonia    " walking"   Preventative health care    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   Wears glasses     Social History   Tobacco Use   Smoking status: Former    Packs/day: 1.00    Years: 20.00    Pack years: 20.00    Types: Cigarettes    Quit date: 07/18/1989    Years since quitting: 31.9   Smokeless tobacco: Never  Vaping Use   Vaping Use: Never used  Substance Use Topics   Alcohol use: Not Currently   Drug use: No    Family History  Problem Relation Age of Onset   Hypertension Mother    Diabetes Mother        type 2   Cancer Mother 75       breast in remission   Emphysema Father        smoker   COPD Father        smoker   Stroke Father 17       mini   Heart disease Father    Hypertension Sister    Hyperlipidemia Sister    Scoliosis Sister    Osteoporosis Sister    Hypertension Maternal Grandmother    Scoliosis Maternal Grandmother    Heart disease Maternal Grandfather    Heart disease Paternal Grandfather        smoker   Heart disease Daughter     Allergies  Allergen Reactions   Daptomycin     Drug induced pneumonia     Review of Systems  Constitutional: Negative.   Respiratory: Negative.    Cardiovascular: Negative.   Musculoskeletal:  Positive for back pain.  Skin: Negative.     OBJECTIVE:    Vitals:   06/23/21 1535  BP: Marland Kitchen)  147/88  Pulse: 91  Temp: 97.7 F (36.5 C)  TempSrc: Oral  SpO2: 99%  Weight: 173 lb (78.5 kg)   Body mass  index is 24.82 kg/m.  Physical Exam Constitutional:      General: He is not in acute distress.    Appearance: Normal appearance.  HENT:     Head: Normocephalic and atraumatic.  Eyes:     Extraocular Movements: Extraocular movements intact.     Conjunctiva/sclera: Conjunctivae normal.  Pulmonary:     Effort: Pulmonary effort is normal. No respiratory distress.  Musculoskeletal:        General: Normal range of motion.  Skin:    General: Skin is warm and dry.  Neurological:     General: No focal deficit present.     Mental Status: He is alert and oriented to person, place, and time.  Psychiatric:        Mood and Affect: Mood normal.        Behavior: Behavior normal.     Labs and Microbiology: CBC Latest Ref Rng & Units 04/07/2021 03/15/2021 01/07/2021  WBC 3.8 - 10.8 Thousand/uL 8.5 7.5 8.9  Hemoglobin 13.2 - 17.1 g/dL 13.7 14.0 11.8(L)  Hematocrit 38.5 - 50.0 % 41.9 43.5 35.4(L)  Platelets 140 - 400 Thousand/uL 341 397 450.0(H)   CMP Latest Ref Rng & Units 03/15/2021 02/01/2021 01/05/2021  Glucose 65 - 99 mg/dL 96 115(H) 187(H)  BUN 7 - 25 mg/dL $Remove'12 12 16  'igKnPtl$ Creatinine 0.70 - 1.28 mg/dL 0.66(L) 0.63(L) 0.56  Sodium 135 - 146 mmol/L 136 138 134(L)  Potassium 3.5 - 5.3 mmol/L 4.0 4.4 4.5  Chloride 98 - 110 mmol/L 96(L) 98 95(L)  CO2 20 - 32 mmol/L $RemoveB'31 27 31  'lgXRsRDT$ Calcium 8.6 - 10.3 mg/dL 9.6 9.5 9.3  Total Protein 6.1 - 8.1 g/dL 6.5 - 5.9(L)  Total Bilirubin 0.2 - 1.2 mg/dL 0.5 - 0.9  Alkaline Phos 39 - 117 U/L - - 65  AST 10 - 35 U/L 14 - 15  ALT 9 - 46 U/L 7(L) - 21       ASSESSMENT & PLAN:    1. 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HCC)  Patient completed 8 weeks of tedizolid (plus 10 days linezolid) for his complicated spinal infection with hardware as documented in my note from 03/17/2021.  He was started on dalbavancin 1500 mg IV x1 dose then 1000 mg IV every 2 weeks via [facility] from [facility]  This suppressive regimen was initiated on 05/28/2021.  His  inflammatory markers from that date were improved and normalized.  His back pain is stable and he is overall doing well.  We will continue suppressive dalbavancin for now.  Anticipate a prolonged course of therapy as long as he continues to tolerate.  Continue labs with every 2-week infusions and follow-up in 3 months.    Rayn[facility] 06/23/2021, 3:53 PM

## 2021-06-23 NOTE — Patient Instructions (Signed)
Thank you for coming to see me today. It was a pleasure seeing you.  To Do: Continue with every 2 week antibiotic infusions [place] up in 3 months, sooner if needed  If you have any questions or concerns, please do not hesitate to call the office at [phone].  Take Care,   Luis Brown

## 2021-06-23 NOTE — Progress Notes (Signed)
@Patient  ID: Luis Brown, male    DOB: 1945/09/16, 75 y.o.   MRN: [id number]  Chief Complaint  Patient presents with   Follow-up    Sob increased since 06/13/2021. Lingering cough-lt. Yellow at times, occass. wheezing    Referring provider: Elise Benne  HPI: 75 year old male, former smoker (20-pack-year history).  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   Previous [facility] pulmonary encounters:  12/29/20- Dr. Chase Caller Chief Complaint  Patient presents with   Follow-up    PFT  performed 5/24.  Pt states since last visit, he has been doing okay. States he has lost some weight that he has noticed since last visit.    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 -returns for follow-up.  I personally saw him in the hospital on 11/20/2020.  At that point in time he had been started on Solu-Medrol on 11/18/2020.  When I saw him he was on day 3 of Solu-Medrol and he was needing 3 L nasal cannula oxygen at rest.  At that point in time even with 3 L he was desaturating to 86% just talking.  Put him on prednisone 60 mg/day.  After that he see nurse practitioner x2 at least 1 of which was a telephone visit..  As part of his follow-up on 12/11/2020 he had a CT scan of the chest [3 days after pulmonary function test] and on my personal visualization it showed new onset pneumothorax but significant improvement in infiltrates and inflammation.  He had another follow-up chest x-ray 12/17/2020 in which no pneumothorax reported [similar on 12/15/2020] and pulmonary infiltrates appear improved.  He is now here with his wife  He tells me he is now on 10 mg/day prednisone he just darted that today.  He is 1 week left and  after that the prednisone taper ends.  Because of the pneumothorax is physical therapy has been on hold but 4 days ago he was evaluated and apparently his pulse ox was 100% but this was on oxygen.  He continues to use his oxygen although he is not sure if he is desaturating anymore.  He does feel overall improved compared to 6 weeks ago but still has significant amount of fatigue.  He has significant insomnia which he believes is due to prednisone.  Today's last night was the first night he could sleep when he dropped his prednisone down to 10 mg/day.  He believes his fatigue is multifactorial related to the disease, multiple medications, lack of oxygen, insomnia etc.  02/09/2021 Patient presents today for 1 month follow-up. Since his last visit in June, he has established with cardiology and was dx with afib. He is currently on Xarelto and Digoxin. His wife states that he has had more energy recently. He is scheduled for testosterone injection tomorrow. He gets winded with stairs and heavy exertion. He reports intermittent wheezing. He needs refill of albuterol rescue inhaler. He has weaned himself of oxygen and O2 equipment has been picked up. He has been sleeping better at night. Anxiety has improved, he stopped xanax. Due for follow-up CXR today. 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  02/23/2021 Patient presents today for 2 week follow-up. During his last visit he was given trial Spiriva Respimat d/t dyspnea /wheezing symptoms and  moderate obstruction seen on his PFTs. He has been using sample of Spiriva Respimat 2.66mcg as prescribed, he occasionally misses a dose for 1-2 days. He states that his wheezing has stopped but he has not noticed a significant difference in dyspnea symptoms. His ILD scale appears improved from two weeks go. He has having some muscle spasms in his back that started before being placed on LABA.   04/26/2021 -   Chief  Complaint  Patient presents with   Follow-up    F/u, trelegy medication concerns    Luis Brown 75 y.o. -returns for follow-up.  Since I last saw him he seen nurse practitioner 2 times.  Symptom scores for dyspnea continue to improve.  He was also walked for oxygen desaturation February 04, 2019 did not desaturate.  He says he continues to get better.  He is off prednisone.  Nurse practitioner noticed obstructive physiology on the pulmonary function test.  Put him on inhaler.  Currently is on Trelegy.  He says it helps him tremendously.  He feels it opens up his lungs.  He declined his flu shot today because he will have it after the COVID-vaccine in October or November 2022.   06/24/2021- Interim hx  Patient presents today for acute OV/cough. Patient reports increased shortness of breath since 06/13/21 with associated fatigue, cough and chest tightness. Cough is productive with clear mucus. Symptoms started after Thanksgiving. His wife had the flu at the same time. His cough initially improved while taking mucinex, he stopped since he was starting to feel better. Denies f/c/s, purulent mucus, N/V/D. CAT score 17.    Allergies  Allergen Reactions   Daptomycin     Drug induced pneumonia     Immunization History  Administered Date(s) Administered   Fluad Quad(high Dose 65+) 04/05/2019, 06/29/2020, 05/18/2021   Influenza Split 04/23/2012   Influenza Whole 02/16/2011   Influenza, High Dose Seasonal PF 05/06/2013, 03/25/2016, 05/05/2017, 06/07/2018   PFIZER(Purple Top)SARS-COV-2 Vaccination 08/23/2019, 09/13/2019, 06/07/2020   Pfizer Covid-19 Vaccine Bivalent Booster 59yrs & up 04/29/2021   Pneumococcal Conjugate-13 03/25/2016   Pneumococcal Polysaccharide-23 01/20/2009, 10/19/2011   Td 07/09/2003, 07/18/2008   Tdap 07/20/2011   Zoster, Live 04/27/2016    Past Medical History:  Diagnosis Date   Acute pharyngitis 10/21/2013   Allergy    grass and pollen   Anxiety and depression  10/25/2011   BPH (benign prostatic hyperplasia) 04/23/2012   Chicken pox as a child   DDD (degenerative disc disease)    cervical responds to steroid injections and low back required surgery   DDD (degenerative disc disease), lumbosacral    Diabetes mellitus    pre   Dyspnea    ED (erectile dysfunction) 04/23/2012   Elevated BP    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   Esophageal reflux 02/10/2015   Fatigue    HTN (hypertension)    Hyperglycemia    preDM    Hyperlipidemia    Insomnia    Low back pain radiating to both legs 01/16/2017   Measles as a child   Overweight(278.02)    Peripheral neuropathy 08/07/2019   Personal history of colonic polyps 10/27/2012   [facility]   Pneumonia    " walking"   Preventative health care    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   Wears glasses     Tobacco History: Social History   Tobacco Use  Smoking Status Former   Packs/day: 1.00   Years: 20.00   Pack years: 20.00   Types: Cigarettes  Quit date: 07/18/1989   Years since quitting: 31.9  Smokeless Tobacco Never   Counseling given: Not Answered   Outpatient Medications Prior to Visit  Medication Sig Dispense Refill   albuterol (PROVENTIL HFA) 108 (90 Base) MCG/ACT inhaler TAKE 2 PUFFS BY MOUTH EVERY 6 HOURS AS NEEDED FOR WHEEZE OR SHORTNESS OF BREATH 8 g 5   atorvastatin (LIPITOR) 10 MG tablet TAKE 1 TABLET BY MOUTH EVERY DAY 30 tablet 0   DALVANCE 500 MG SOLR Inject into the vein.     digoxin (LANOXIN) 0.125 MG tablet Take 1 tablet (0.125 mg total) by mouth daily. 90 tablet 3   ferrous fumarate (FERRETTS) 325 (106 Fe) MG TABS tablet Take 1 tablet (106 mg of iron total) by mouth daily. 90 tablet 3   glucose blood (CONTOUR NEXT TEST) test strip Check blood sugar once daily 100 each 12   metFORMIN (GLUCOPHAGE) 1000 MG tablet TAKE 1 TABLET (1,000 MG TOTAL) BY MOUTH 2 (TWO) TIMES DAILY WITH A MEAL. 180 tablet 0   metoprolol tartrate (LOPRESSOR) 100 MG tablet Take 1 tablet (100  mg total) two hours prior to CT testing 1 tablet 0   oxyCODONE-acetaminophen (PERCOCET/ROXICET) 5-325 MG tablet Take 1 tablet by mouth every 4 (four) hours as needed.     pantoprazole (PROTONIX) 40 MG tablet TAKE 1 TABLET BY MOUTH TWICE A DAY BEFORE A MEAL 180 tablet 1   pregabalin (LYRICA) 75 MG capsule TAKE 1 CAPSULE BY MOUTH TWICE A DAY 60 capsule 3   rivaroxaban (XARELTO) 20 MG TABS tablet Take 1 tablet (20 mg total) by mouth daily with supper. 90 tablet 3   sitaGLIPtin (JANUVIA) 50 MG tablet Take 1 tablet (50 mg total) by mouth daily. 30 tablet 3   STIOLTO RESPIMAT 2.5-2.5 MCG/ACT AERS SMARTSIG:2 Puff(s) Via Inhaler Daily     sucralfate (CARAFATE) 1 g tablet TAKE 1 TABLET (1 G TOTAL) BY MOUTH 4 (FOUR) TIMES DAILY - WITH MEALS AND AT BEDTIME. 120 tablet 1   tamsulosin (FLOMAX) 0.4 MG CAPS capsule Take 2 capsules (0.8 mg total) by mouth daily. 180 capsule 3   testosterone cypionate (DEPOTESTOSTERONE CYPIONATE) 200 MG/ML injection INJECT 1 ML (200 MG TOTAL) INTO THE MUSCLE EVERY 14 DAYS 2 mL 0   tiZANidine (ZANAFLEX) 4 MG tablet Take 1.5 tablets (6 mg total) by mouth every 6 (six) hours as needed for muscle spasms. 540 tablet 1   venlafaxine XR (EFFEXOR-XR) 150 MG 24 hr capsule TAKE 1 CAPSULE BY MOUTH DAILY WITH BREAKFAST. 90 capsule 1   zolpidem (AMBIEN) 5 MG tablet Take 1 tablet (5 mg total) by mouth at bedtime as needed. for sleep 14 tablet 0   COVID-19 mRNA bivalent vaccine, Pfizer, (PFIZER COVID-19 VAC BIVALENT) injection Inject into the muscle. 0.3 mL 0   No facility-administered medications prior to visit.   Review of Systems  Review of Systems  Constitutional:  Positive for fatigue.  HENT:  Positive for postnasal drip.   Respiratory:  Positive for cough, chest tightness and shortness of breath.     Physical Exam  BP 128/68 (BP Location: Left Arm, Cuff Size: Normal)   Pulse 87   Temp 97.8 F (36.6 C) (Temporal)   Ht 5\' 10"  (1.778 m)   Wt 175 lb 6.4 oz (79.6 kg)   SpO2 98%    BMI 25.17 kg/m  Physical Exam Constitutional:      Appearance: Normal appearance.  HENT:     Head: Normocephalic and atraumatic.     Right  Ear: Tympanic membrane normal.     Left Ear: Tympanic membrane normal.     Mouth/Throat:     Mouth: Mucous membranes are moist.     Pharynx: Oropharynx is clear.  Cardiovascular:     Rate and Rhythm: Normal rate and regular rhythm.  Pulmonary:     Effort: Pulmonary effort is normal.     Breath sounds: Normal breath sounds. No wheezing, rhonchi or rales.     Comments: CTA Musculoskeletal:        General: Normal range of motion.  Skin:    General: Skin is warm and dry.  Neurological:     General: No focal deficit present.     Mental Status: He is alert and oriented to person, place, and time. Mental status is at baseline.  Psychiatric:        Mood and Affect: Mood normal.        Behavior: Behavior normal.        Thought Content: Thought content normal.        Judgment: Judgment normal.     Lab Results:  CBC    Component Value Date/Time   WBC 8.5 04/07/2021 1548   RBC 4.67 04/07/2021 1548   HGB 13.7 04/07/2021 1548   HGB 12.8 (L) 11/18/2020 1608   HCT 41.9 04/07/2021 1548   PLT 341 04/07/2021 1548   MCV 89.7 04/07/2021 1548   MCH 29.3 04/07/2021 1548   MCHC 32.7 04/07/2021 1548   RDW 14.8 04/07/2021 1548   LYMPHSABS 612 (L) 04/07/2021 1548   MONOABS 0.9 01/07/2021 1351   EOSABS 68 04/07/2021 1548   BASOSABS 34 04/07/2021 1548    BMET    Component Value Date/Time   NA 136 03/15/2021 1519   NA 138 02/01/2021 0835   K 4.0 03/15/2021 1519   CL 96 (L) 03/15/2021 1519   CO2 31 03/15/2021 1519   GLUCOSE 96 03/15/2021 1519   BUN 12 03/15/2021 1519   BUN 12 02/01/2021 0835   CREATININE 0.66 (L) 03/15/2021 1519   CALCIUM 9.6 03/15/2021 1519   GFRNONAA 84 12/04/2020 1523   GFRAA 98 12/04/2020 1523    BNP    Component Value Date/Time   BNP 68.6 11/10/2020 1654    ProBNP No results found for: PROBNP  Imaging: XR  Thoracic Spine 2 View  Result Date: 05/31/2021 AP and lateral radiographs of the thoracic spine demonstrate healing areas of discitis T4-5 and T6-7 and at T9-10 There is autofusion occurring at each of these levels.. No further loosening of hardware and no further screw breakage.     Assessment & Plan:   COPD with acute exacerbation ([place]) - AECOPD secondary to viral URI. Cough is productive with clear mucus. Associated chest tightness and sob. Lungs were clear on exam today. Starting prednisone taper 40mg  x 2 days; 30mg  x 2 days; 20mg  x 2 days; 10mg  x 2 days. Continue Stiolto Respimat two puffs daily and prn albuterol 2 puffs every 6 hours for breakthrough sob/wheezing. Advised patient resume Mucinex-DM 600mg  tablet q12hrs. FU if symptoms do not improve or worsen- would get CXR.    Martyn Ehrich, NP 06/24/2021

## 2021-06-24 ENCOUNTER — Ambulatory Visit (INDEPENDENT_AMBULATORY_CARE_PROVIDER_SITE_OTHER): Payer: Medicare Other | Admitting: Primary Care

## 2021-06-24 ENCOUNTER — Encounter: Payer: Self-pay | Admitting: Primary Care

## 2021-06-24 DIAGNOSIS — G062 Extradural and subdural abscess, unspecified: Secondary | ICD-10-CM | POA: Diagnosis not present

## 2021-06-24 DIAGNOSIS — M4325 Fusion of spine, thoracolumbar region: Secondary | ICD-10-CM | POA: Diagnosis not present

## 2021-06-24 DIAGNOSIS — J441 Chronic obstructive pulmonary disease with (acute) exacerbation: Secondary | ICD-10-CM | POA: Diagnosis not present

## 2021-06-24 DIAGNOSIS — M4635 Infection of intervertebral disc (pyogenic), thoracolumbar region: Secondary | ICD-10-CM | POA: Diagnosis not present

## 2021-06-24 MED ORDER — PREDNISONE 10 MG PO TABS: ORAL_TABLET | ORAL | 0 refills | Status: DC

## 2021-06-24 NOTE — Patient Instructions (Addendum)
Symptoms appear consistent with viral URI which has exacerbated your underlying COPD Treating with prednisone taper. If you develop colored mucus or fevers please notify me  Recommendations: - Continue Stiolto Respimat two puffs daily in the morning - Use Albuterol rescue inhaler 2 puffs every 6 hours as needed for breakthrough shortness of breath - Start Mucinex-DM 600mg  tablet every 12 hours OR Robitussin-DM every 4 hours per directions  Rx: - Prednisone taper as directed   Follow-up: - If symptoms do not improve or worsen- would recommend CXR

## 2021-06-24 NOTE — Assessment & Plan Note (Addendum)
-   AECOPD secondary to viral URI. Cough is productive with clear mucus. Associated chest tightness and sob. Lungs were clear on exam today. Starting prednisone taper 40mg  x 2 days; 30mg  x 2 days; 20mg  x 2 days; 10mg  x 2 days. Continue Stiolto Respimat two puffs daily and prn albuterol 2 puffs every 6 hours for breakthrough sob/wheezing. Advised patient resume Mucinex-DM 600mg  tablet q12hrs. FU if symptoms do not improve or worsen- would get CXR.

## 2021-06-25 DIAGNOSIS — M4635 Infection of intervertebral disc (pyogenic), thoracolumbar region: Secondary | ICD-10-CM | POA: Diagnosis not present

## 2021-06-25 DIAGNOSIS — M4325 Fusion of spine, thoracolumbar region: Secondary | ICD-10-CM | POA: Diagnosis not present

## 2021-06-29 ENCOUNTER — Telehealth: Payer: Self-pay | Admitting: *Deleted

## 2021-06-29 NOTE — Telephone Encounter (Signed)
Left message to call back to discuss ablation scheduling/instructions.

## 2021-07-01 NOTE — Telephone Encounter (Signed)
Patient was returning call. Please advise ?

## 2021-07-01 NOTE — Telephone Encounter (Signed)
Advised the patient that Luis Brown was not in the office today and she would return his call back.  Verbalized understanding.

## 2021-07-02 ENCOUNTER — Telehealth: Payer: Self-pay | Admitting: Medical

## 2021-07-02 ENCOUNTER — Encounter: Payer: Self-pay | Admitting: Medical

## 2021-07-02 MED ORDER — ZOLPIDEM TARTRATE 5 MG PO TABS: 5.0000 mg | ORAL_TABLET | Freq: Every evening | ORAL | 0 refills | Status: DC | PRN

## 2021-07-02 NOTE — Telephone Encounter (Signed)
Rx ambien sent to pt pharmacy.

## 2021-07-05 NOTE — Telephone Encounter (Signed)
Briefly reviewed procedure instructions with pt. Aware I will send CT & ablation instructions via mychart We will figure out lab work needed when instructions sent.

## 2021-07-06 ENCOUNTER — Ambulatory Visit (INDEPENDENT_AMBULATORY_CARE_PROVIDER_SITE_OTHER): Payer: Medicare Other

## 2021-07-06 DIAGNOSIS — E291 Testicular hypofunction: Secondary | ICD-10-CM | POA: Diagnosis not present

## 2021-07-06 MED ORDER — TESTOSTERONE CYPIONATE 200 MG/ML IM SOLN
200.0000 mg | INTRAMUSCULAR | Status: DC
  Administered 2021-07-06: 10:00:00 200 mg via INTRAMUSCULAR

## 2021-07-06 NOTE — Progress Notes (Addendum)
Luis Brown is a 75 y.o. male presents to the office today for Testosterone injections, per physician's orders. Testosterone Cypionate 200mg /ml was administered IM in R upper outer quadrant today. Patient tolerated injection. Patient next injection due: 16 days, appt made yes   Mackie Pai, PA-C

## 2021-07-08 ENCOUNTER — Telehealth: Payer: Self-pay | Admitting: Primary Care

## 2021-07-08 DIAGNOSIS — G062 Extradural and subdural abscess, unspecified: Secondary | ICD-10-CM | POA: Diagnosis not present

## 2021-07-08 DIAGNOSIS — M4325 Fusion of spine, thoracolumbar region: Secondary | ICD-10-CM | POA: Diagnosis not present

## 2021-07-08 DIAGNOSIS — M4635 Infection of intervertebral disc (pyogenic), thoracolumbar region: Secondary | ICD-10-CM | POA: Diagnosis not present

## 2021-07-08 MED ORDER — PREDNISONE 10 MG PO TABS: ORAL_TABLET | ORAL | 0 refills | Status: DC

## 2021-07-08 NOTE — Telephone Encounter (Signed)
Yes please send in 40mg  x 3 days; 30mg  x 3 days; 20mg  x 3 days; 10mg  x 3 days. Continue Stiolto Respimat two puffs daily and prn albuterol 2 puffs every 6 hours for breakthrough sob/wheezing.

## 2021-07-08 NOTE — Telephone Encounter (Signed)
I called the patient and he reports that he is having some shortness of breath x 2-3 days with exertion, and he denies any other symptoms such as cough, fever, negative for Covid and no body aches. He has been taking Mucinex. He feels that the last round of medication was not enough to help him feel better.   Patient wants to know if he can have another round of prednisone to help with his symptoms. Please advise.

## 2021-07-08 NOTE — Telephone Encounter (Signed)
I called the patient back and he is agreeable to the recommendations by the provider. I have sent in the prednisone pack per provider recommendations. Nothing further needed.

## 2021-07-09 DIAGNOSIS — M4325 Fusion of spine, thoracolumbar region: Secondary | ICD-10-CM | POA: Diagnosis not present

## 2021-07-09 DIAGNOSIS — M4635 Infection of intervertebral disc (pyogenic), thoracolumbar region: Secondary | ICD-10-CM | POA: Diagnosis not present

## 2021-07-09 NOTE — Telephone Encounter (Signed)
Aware instructions will be sent next week.

## 2021-07-13 ENCOUNTER — Other Ambulatory Visit: Payer: Self-pay | Admitting: Specialist

## 2021-07-13 ENCOUNTER — Telehealth: Payer: Self-pay | Admitting: Primary Care

## 2021-07-13 MED ORDER — CEFDINIR 300 MG PO CAPS: 300.0000 mg | ORAL_CAPSULE | Freq: Two times a day (BID) | ORAL | 0 refills | Status: DC

## 2021-07-13 NOTE — Telephone Encounter (Signed)
Called patient, he verbalized understanding. He is still on the prednisone taper that [facility] sent in last week. He wants to try the [facility].   RX has been sent in for him.   Nothing further needed at time of call.

## 2021-07-13 NOTE — Telephone Encounter (Signed)
Called and spoke with patient who states he has a cough in the morning, coughing up green mucus, feels like he can't get a deep breath, Oxygen level going between 80-91. Does not wear oxygen. Fever of 100.7. 2 home covid tests and they are negative. Patient was seen on 06/24/21 by [name] and was given prednisone and was feeling better but now is not.   Dr. Lamonte Sakai please advise   Instructions  Symptoms appear consistent with viral URI which has exacerbated your underlying COPD Treating with prednisone taper. If you develop colored mucus or fevers please notify me   Recommendations: - Continue Stiolto Respimat two puffs daily in the morning - Use Albuterol rescue inhaler 2 puffs every 6 hours as needed for breakthrough shortness of breath - Start Mucinex-DM 600mg  tablet every 12 hours OR Robitussin-DM every 4 hours per directions   Rx: - Prednisone taper as directed    Follow-up: - If symptoms do not improve or worsen- would recommend CXR         After Visit Summary (Printed 06/24/2021)

## 2021-07-13 NOTE — Telephone Encounter (Signed)
°  Some virus. Not sure which one  Plan  - can he go to a lab and get PCR for flu, RSV, covid -> if not office antigen tests for same - if oxygen leves drop he has to to go to ER/urgent care/needs work in this week because he mgith have PNA - for now  - consider  OMNICEF 300mg  bid x 5 ays   - Please take prednisone 40 mg x1 day, then 30 mg x1 day, then 20 mg x1 day, then 10 mg x1 day, and then 5 mg x1 day and stop  - [facility] plan might not work -but is a way to get him better an dkeep him out of hospital    Immunization History  Administered Date(s) Administered   Fluad Quad(high Dose 65+) 04/05/2019, 06/29/2020, 05/18/2021   Influenza Split 04/23/2012   Influenza Whole 02/16/2011   Influenza, High Dose Seasonal PF 05/06/2013, 03/25/2016, 05/05/2017, 06/07/2018   PFIZER(Purple Top)SARS-COV-2 Vaccination 08/23/2019, 09/13/2019, 06/07/2020   Pfizer Covid-19 Vaccine Bivalent Booster 44yrs & up 04/29/2021   Pneumococcal Conjugate-13 03/25/2016   Pneumococcal Polysaccharide-23 01/20/2009, 10/19/2011   Td 07/09/2003, 07/18/2008   Tdap 07/20/2011   Zoster, Live 04/27/2016     Allergies  Allergen Reactions   Daptomycin     Drug induced pneumonia

## 2021-07-15 ENCOUNTER — Encounter (HOSPITAL_COMMUNITY): Payer: Self-pay

## 2021-07-15 ENCOUNTER — Other Ambulatory Visit: Payer: Self-pay | Admitting: Medical

## 2021-07-15 ENCOUNTER — Encounter: Payer: Self-pay | Admitting: Internal Medicine

## 2021-07-15 ENCOUNTER — Emergency Department (HOSPITAL_COMMUNITY): Payer: Medicare Other

## 2021-07-15 ENCOUNTER — Other Ambulatory Visit: Payer: Self-pay

## 2021-07-15 ENCOUNTER — Inpatient Hospital Stay (HOSPITAL_COMMUNITY): Payer: Medicare Other

## 2021-07-15 ENCOUNTER — Inpatient Hospital Stay (HOSPITAL_COMMUNITY)
Admission: EM | Admit: 2021-07-15 | Discharge: 2021-07-24 | DRG: 871 | Disposition: A | Discharge status: Home / Self Care | Payer: Medicare Other | Attending: Family Medicine | Admitting: Family Medicine

## 2021-07-15 DIAGNOSIS — J449 Chronic obstructive pulmonary disease, unspecified: Secondary | ICD-10-CM | POA: Diagnosis not present

## 2021-07-15 DIAGNOSIS — J471 Bronchiectasis with (acute) exacerbation: Secondary | ICD-10-CM | POA: Diagnosis present

## 2021-07-15 DIAGNOSIS — I517 Cardiomegaly: Secondary | ICD-10-CM | POA: Diagnosis not present

## 2021-07-15 DIAGNOSIS — G4734 Idiopathic sleep related nonobstructive alveolar hypoventilation: Secondary | ICD-10-CM | POA: Diagnosis present

## 2021-07-15 DIAGNOSIS — J470 Bronchiectasis with acute lower respiratory infection: Secondary | ICD-10-CM | POA: Diagnosis not present

## 2021-07-15 DIAGNOSIS — R6520 Severe sepsis without septic shock: Secondary | ICD-10-CM | POA: Diagnosis not present

## 2021-07-15 DIAGNOSIS — N400 Enlarged prostate without lower urinary tract symptoms: Secondary | ICD-10-CM | POA: Diagnosis present

## 2021-07-15 DIAGNOSIS — Z79899 Other long term (current) drug therapy: Secondary | ICD-10-CM

## 2021-07-15 DIAGNOSIS — G4700 Insomnia, unspecified: Secondary | ICD-10-CM | POA: Diagnosis not present

## 2021-07-15 DIAGNOSIS — I10 Essential (primary) hypertension: Secondary | ICD-10-CM | POA: Diagnosis present

## 2021-07-15 DIAGNOSIS — J432 Centrilobular emphysema: Secondary | ICD-10-CM | POA: Diagnosis not present

## 2021-07-15 DIAGNOSIS — F32A Depression, unspecified: Secondary | ICD-10-CM | POA: Diagnosis present

## 2021-07-15 DIAGNOSIS — E1142 Type 2 diabetes mellitus with diabetic polyneuropathy: Secondary | ICD-10-CM | POA: Diagnosis present

## 2021-07-15 DIAGNOSIS — Z7901 Long term (current) use of anticoagulants: Secondary | ICD-10-CM

## 2021-07-15 DIAGNOSIS — I248 Other forms of acute ischemic heart disease: Secondary | ICD-10-CM | POA: Diagnosis not present

## 2021-07-15 DIAGNOSIS — M5137 Other intervertebral disc degeneration, lumbosacral region: Secondary | ICD-10-CM | POA: Diagnosis present

## 2021-07-15 DIAGNOSIS — E871 Hypo-osmolality and hyponatremia: Secondary | ICD-10-CM | POA: Diagnosis present

## 2021-07-15 DIAGNOSIS — Z79891 Long term (current) use of opiate analgesic: Secondary | ICD-10-CM

## 2021-07-15 DIAGNOSIS — J9601 Acute respiratory failure with hypoxia: Secondary | ICD-10-CM | POA: Diagnosis not present

## 2021-07-15 DIAGNOSIS — I2699 Other pulmonary embolism without acute cor pulmonale: Secondary | ICD-10-CM | POA: Diagnosis present

## 2021-07-15 DIAGNOSIS — J438 Other emphysema: Secondary | ICD-10-CM | POA: Diagnosis present

## 2021-07-15 DIAGNOSIS — G062 Extradural and subdural abscess, unspecified: Secondary | ICD-10-CM | POA: Diagnosis not present

## 2021-07-15 DIAGNOSIS — K219 Gastro-esophageal reflux disease without esophagitis: Secondary | ICD-10-CM | POA: Diagnosis present

## 2021-07-15 DIAGNOSIS — G8929 Other chronic pain: Secondary | ICD-10-CM | POA: Diagnosis present

## 2021-07-15 DIAGNOSIS — M5030 Other cervical disc degeneration, unspecified cervical region: Secondary | ICD-10-CM | POA: Diagnosis not present

## 2021-07-15 DIAGNOSIS — J479 Bronchiectasis, uncomplicated: Secondary | ICD-10-CM | POA: Diagnosis not present

## 2021-07-15 DIAGNOSIS — J189 Pneumonia, unspecified organism: Secondary | ICD-10-CM | POA: Diagnosis present

## 2021-07-15 DIAGNOSIS — Z86711 Personal history of pulmonary embolism: Secondary | ICD-10-CM

## 2021-07-15 DIAGNOSIS — Z7984 Long term (current) use of oral hypoglycemic drugs: Secondary | ICD-10-CM

## 2021-07-15 DIAGNOSIS — F419 Anxiety disorder, unspecified: Secondary | ICD-10-CM | POA: Diagnosis present

## 2021-07-15 DIAGNOSIS — I2693 Single subsegmental pulmonary embolism without acute cor pulmonale: Secondary | ICD-10-CM | POA: Diagnosis not present

## 2021-07-15 DIAGNOSIS — J441 Chronic obstructive pulmonary disease with (acute) exacerbation: Secondary | ICD-10-CM | POA: Diagnosis not present

## 2021-07-15 DIAGNOSIS — J849 Interstitial pulmonary disease, unspecified: Secondary | ICD-10-CM | POA: Diagnosis not present

## 2021-07-15 DIAGNOSIS — R0902 Hypoxemia: Secondary | ICD-10-CM

## 2021-07-15 DIAGNOSIS — I480 Paroxysmal atrial fibrillation: Secondary | ICD-10-CM | POA: Diagnosis not present

## 2021-07-15 DIAGNOSIS — Z20822 Contact with and (suspected) exposure to covid-19: Secondary | ICD-10-CM | POA: Diagnosis present

## 2021-07-15 DIAGNOSIS — J90 Pleural effusion, not elsewhere classified: Secondary | ICD-10-CM | POA: Diagnosis not present

## 2021-07-15 DIAGNOSIS — E1165 Type 2 diabetes mellitus with hyperglycemia: Secondary | ICD-10-CM

## 2021-07-15 DIAGNOSIS — E119 Type 2 diabetes mellitus without complications: Secondary | ICD-10-CM | POA: Diagnosis not present

## 2021-07-15 DIAGNOSIS — E7849 Other hyperlipidemia: Secondary | ICD-10-CM | POA: Diagnosis not present

## 2021-07-15 DIAGNOSIS — I110 Hypertensive heart disease with heart failure: Secondary | ICD-10-CM | POA: Diagnosis not present

## 2021-07-15 DIAGNOSIS — R778 Other specified abnormalities of plasma proteins: Secondary | ICD-10-CM | POA: Diagnosis not present

## 2021-07-15 DIAGNOSIS — J9621 Acute and chronic respiratory failure with hypoxia: Secondary | ICD-10-CM | POA: Diagnosis present

## 2021-07-15 DIAGNOSIS — Z833 Family history of diabetes mellitus: Secondary | ICD-10-CM

## 2021-07-15 DIAGNOSIS — A419 Sepsis, unspecified organism: Principal | ICD-10-CM | POA: Diagnosis present

## 2021-07-15 DIAGNOSIS — R0602 Shortness of breath: Secondary | ICD-10-CM | POA: Diagnosis not present

## 2021-07-15 DIAGNOSIS — I5030 Unspecified diastolic (congestive) heart failure: Secondary | ICD-10-CM | POA: Diagnosis present

## 2021-07-15 DIAGNOSIS — T380X5A Adverse effect of glucocorticoids and synthetic analogues, initial encounter: Secondary | ICD-10-CM | POA: Diagnosis present

## 2021-07-15 DIAGNOSIS — J704 Drug-induced interstitial lung disorders, unspecified: Secondary | ICD-10-CM | POA: Diagnosis present

## 2021-07-15 DIAGNOSIS — Z86718 Personal history of other venous thrombosis and embolism: Secondary | ICD-10-CM | POA: Diagnosis not present

## 2021-07-15 DIAGNOSIS — I2510 Atherosclerotic heart disease of native coronary artery without angina pectoris: Secondary | ICD-10-CM | POA: Diagnosis not present

## 2021-07-15 DIAGNOSIS — T50905A Adverse effect of unspecified drugs, medicaments and biological substances, initial encounter: Secondary | ICD-10-CM | POA: Diagnosis not present

## 2021-07-15 DIAGNOSIS — Z8701 Personal history of pneumonia (recurrent): Secondary | ICD-10-CM

## 2021-07-15 DIAGNOSIS — Z87891 Personal history of nicotine dependence: Secondary | ICD-10-CM

## 2021-07-15 DIAGNOSIS — M4325 Fusion of spine, thoracolumbar region: Secondary | ICD-10-CM | POA: Diagnosis not present

## 2021-07-15 DIAGNOSIS — Z981 Arthrodesis status: Secondary | ICD-10-CM

## 2021-07-15 DIAGNOSIS — Z825 Family history of asthma and other chronic lower respiratory diseases: Secondary | ICD-10-CM

## 2021-07-15 DIAGNOSIS — M549 Dorsalgia, unspecified: Secondary | ICD-10-CM | POA: Diagnosis present

## 2021-07-15 DIAGNOSIS — Z881 Allergy status to other antibiotic agents status: Secondary | ICD-10-CM

## 2021-07-15 DIAGNOSIS — Z8249 Family history of ischemic heart disease and other diseases of the circulatory system: Secondary | ICD-10-CM

## 2021-07-15 DIAGNOSIS — I2694 Multiple subsegmental pulmonary emboli without acute cor pulmonale: Secondary | ICD-10-CM | POA: Diagnosis not present

## 2021-07-15 DIAGNOSIS — M4635 Infection of intervertebral disc (pyogenic), thoracolumbar region: Secondary | ICD-10-CM | POA: Diagnosis not present

## 2021-07-15 DIAGNOSIS — M1990 Unspecified osteoarthritis, unspecified site: Secondary | ICD-10-CM | POA: Diagnosis present

## 2021-07-15 DIAGNOSIS — I2609 Other pulmonary embolism with acute cor pulmonale: Secondary | ICD-10-CM | POA: Diagnosis not present

## 2021-07-15 LAB — CREATININE, URINE, RANDOM: Creatinine, Urine: 64.37 mg/dL

## 2021-07-15 LAB — COMPREHENSIVE METABOLIC PANEL
ALT: 17 U/L (ref 0–44)
AST: 22 U/L (ref 15–41)
Albumin: 3.4 g/dL — ABNORMAL LOW (ref 3.5–5.0)
Alkaline Phosphatase: 157 U/L — ABNORMAL HIGH (ref 38–126)
Anion gap: 11 (ref 5–15)
BUN: 25 mg/dL — ABNORMAL HIGH (ref 8–23)
CO2: 23 mmol/L (ref 22–32)
Calcium: 8.7 mg/dL — ABNORMAL LOW (ref 8.9–10.3)
Chloride: 93 mmol/L — ABNORMAL LOW (ref 98–111)
Creatinine, Ser: 0.91 mg/dL (ref 0.61–1.24)
GFR, Estimated: >60 mL/min (ref >60)
Glucose, Bld: 482 mg/dL — ABNORMAL HIGH (ref 70–99)
Potassium: 4.2 mmol/L (ref 3.5–5.1)
Sodium: 127 mmol/L — ABNORMAL LOW (ref 135–145)
Total Bilirubin: 1.3 mg/dL — ABNORMAL HIGH (ref 0.3–1.2)
Total Protein: 6.4 g/dL — ABNORMAL LOW (ref 6.5–8.1)

## 2021-07-15 LAB — TROPONIN I (HIGH SENSITIVITY): Troponin I (High Sensitivity): 11 ng/L (ref <18)

## 2021-07-15 LAB — URINALYSIS, ROUTINE W REFLEX MICROSCOPIC
Bacteria, UA: NONE SEEN
Bilirubin Urine: NEGATIVE
Glucose, UA: >=500 mg/dL — AB
Ketones, ur: NEGATIVE mg/dL
Leukocytes,Ua: NEGATIVE
Nitrite: NEGATIVE
Protein, ur: NEGATIVE mg/dL
Specific Gravity, Urine: 1.024 (ref 1.005–1.030)
pH: 6 (ref 5.0–8.0)

## 2021-07-15 LAB — CBC WITH DIFFERENTIAL/PLATELET
Abs Immature Granulocytes: 0.11 10*3/uL — ABNORMAL HIGH (ref 0.00–0.07)
Basophils Absolute: 0 10*3/uL (ref 0.0–0.1)
Basophils Relative: 0 %
Eosinophils Absolute: 0.1 10*3/uL (ref 0.0–0.5)
Eosinophils Relative: 1 %
HCT: 36.2 % — ABNORMAL LOW (ref 39.0–52.0)
Hemoglobin: 12.3 g/dL — ABNORMAL LOW (ref 13.0–17.0)
Immature Granulocytes: 1 %
Lymphocytes Relative: 2 %
Lymphs Abs: 0.3 10*3/uL — ABNORMAL LOW (ref 0.7–4.0)
MCH: 30.4 pg (ref 26.0–34.0)
MCHC: 34 g/dL (ref 30.0–36.0)
MCV: 89.6 fL (ref 80.0–100.0)
Monocytes Absolute: 1.2 10*3/uL — ABNORMAL HIGH (ref 0.1–1.0)
Monocytes Relative: 8 %
Neutro Abs: 13.2 10*3/uL — ABNORMAL HIGH (ref 1.7–7.7)
Neutrophils Relative %: 88 %
Platelets: 338 10*3/uL (ref 150–400)
RBC: 4.04 MIL/uL — ABNORMAL LOW (ref 4.22–5.81)
RDW: 14.7 % (ref 11.5–15.5)
WBC: 14.9 10*3/uL — ABNORMAL HIGH (ref 4.0–10.5)
nRBC: 0 % (ref 0.0–0.2)

## 2021-07-15 LAB — PROTIME-INR
INR: 1.3 — ABNORMAL HIGH (ref 0.8–1.2)
Prothrombin Time: 16.3 seconds — ABNORMAL HIGH (ref 11.4–15.2)

## 2021-07-15 LAB — LACTIC ACID, PLASMA
Lactic Acid, Venous: 1.9 mmol/L (ref 0.5–1.9)
Lactic Acid, Venous: 2.3 mmol/L (ref 0.5–1.9)
Lactic Acid, Venous: 1.5 mmol/L (ref 0.5–1.9)

## 2021-07-15 LAB — APTT: aPTT: 28 seconds (ref 24–36)

## 2021-07-15 LAB — RESP PANEL BY RT-PCR (FLU A&B, COVID) ARPGX2
Influenza A by PCR: NEGATIVE
Influenza B by PCR: NEGATIVE
SARS Coronavirus 2 by RT PCR: NEGATIVE

## 2021-07-15 LAB — DIGOXIN LEVEL: Digoxin Level: 0.4 ng/mL — ABNORMAL LOW (ref 0.8–2.0)

## 2021-07-15 LAB — PROCALCITONIN: Procalcitonin: 0.21 ng/mL

## 2021-07-15 LAB — CBG MONITORING, ED: Glucose-Capillary: 220 mg/dL — ABNORMAL HIGH (ref 70–99)

## 2021-07-15 LAB — SODIUM, URINE, RANDOM: Sodium, Ur: 35 mmol/L

## 2021-07-15 MED ORDER — SODIUM CHLORIDE 0.9 % IV SOLN
1.0000 g | Freq: Once | INTRAVENOUS | Status: AC
  Administered 2021-07-15: 20:00:00 1 g via INTRAVENOUS
  Filled 2021-07-15: qty 10

## 2021-07-15 MED ORDER — OXYCODONE-ACETAMINOPHEN 5-325 MG PO TABS
1.0000 | ORAL_TABLET | ORAL | Status: DC | PRN
Start: 2021-07-15 — End: 2021-07-24
  Administered 2021-07-16 – 2021-07-17 (×6): 1 via ORAL
  Administered 2021-07-18 – 2021-07-23 (×18): 2 via ORAL
  Administered 2021-07-23: 1 via ORAL
  Administered 2021-07-23 – 2021-07-24 (×3): 2 via ORAL
  Filled 2021-07-15: qty 2
  Filled 2021-07-15: qty 1
  Filled 2021-07-15 (×9): qty 2
  Filled 2021-07-15 (×2): qty 1
  Filled 2021-07-15 (×2): qty 2
  Filled 2021-07-15 (×2): qty 1
  Filled 2021-07-15 (×5): qty 2
  Filled 2021-07-15: qty 1
  Filled 2021-07-15 (×4): qty 2
  Filled 2021-07-15: qty 1
  Filled 2021-07-15: qty 2

## 2021-07-15 MED ORDER — IOHEXOL 350 MG/ML SOLN
80.0000 mL | Freq: Once | INTRAVENOUS | Status: AC | PRN
  Administered 2021-07-15: 23:00:00 80 mL via INTRAVENOUS

## 2021-07-15 MED ORDER — ALBUTEROL SULFATE HFA 108 (90 BASE) MCG/ACT IN AERS: 2.0000 | INHALATION_SPRAY | RESPIRATORY_TRACT | Status: DC | PRN

## 2021-07-15 MED ORDER — SODIUM CHLORIDE 0.9 % IV SOLN
500.0000 mg | Freq: Once | INTRAVENOUS | Status: AC
  Administered 2021-07-15: 19:00:00 500 mg via INTRAVENOUS
  Filled 2021-07-15: qty 5

## 2021-07-15 MED ORDER — INSULIN ASPART 100 UNIT/ML IJ SOLN
0.0000 [IU] | INTRAMUSCULAR | Status: DC
  Administered 2021-07-15 (×2): 3 [IU] via SUBCUTANEOUS
  Administered 2021-07-16: 09:00:00 5 [IU] via SUBCUTANEOUS
  Administered 2021-07-16: 05:00:00 3 [IU] via SUBCUTANEOUS
  Filled 2021-07-15: qty 0.09

## 2021-07-15 MED ORDER — MELATONIN 5 MG PO TABS
5.0000 mg | ORAL_TABLET | Freq: Every evening | ORAL | Status: DC | PRN
  Administered 2021-07-18 – 2021-07-23 (×6): 5 mg via ORAL
  Filled 2021-07-15 (×6): qty 1

## 2021-07-15 NOTE — Assessment & Plan Note (Signed)
Obtain urine electrolytes rehydrate and follow

## 2021-07-15 NOTE — Assessment & Plan Note (Signed)
Continue lipitor  ?

## 2021-07-15 NOTE — ED Triage Notes (Addendum)
Patient reports that he checked his O2 Sats at home and it 65%. Patient reports that he has been getting increased SOB x 3 weeks. Patient was placed on antibiotics yesterday, Prednisone and has 2 days left of this.  Patient's wife reports that the patient has been waking up with a fever in the mornings and then he is afebrile Today it was 101.1  Patient's O2 Sats in triage 58% on room air and increased to 94% on 2L.  O2 increased to 3L/min via [place] and Sats increasd to 97%.

## 2021-07-15 NOTE — Assessment & Plan Note (Signed)
this patient has acute respiratory failure with Hypoxia  as documented by the presence of following: O2 saturatio< 90% on RA Likely due to:  Pneumonia COPD exacerbation  Provide O2 therapy and titrate as needed  Continuous pulse ox   check Pulse ox with ambulation prior to discharge   may need  TC consult for home O2 set up    flutter valve ordered

## 2021-07-15 NOTE — H&P (Addendum)
Luis Brown HID:[id number] DOB: [birth year] DOA: 07/15/2021   PCP: Mackie Pai, PA-C   Outpatient Specialists:    Pulmonary   Dr. Claris Pong  Patient arrived to ER on 07/15/21 at 1612 Referred by Attending Davonna Belling, MD   Patient coming from: home Lives  With family    Chief Complaint:   Chief Complaint  Patient presents with   Low O2 Sats    HPI: Luis Brown is a 75 y.o. male with medical history significant of COPD, diabetes, HLD, depression    Presented with shortness of breath and hypoxia at home Weight 1 week history of worsening shortness of breath worse with exertion.  Had a COVID test initiated was negative.  Called in to use pulmonologist on 22nd December and was prescribed a course of prednisone.  Noticed now that his oxygen saturations started to deepen to 80s.  Yesterday was started on antibiotics but persistently had shortness of breath and hypoxia call back to his pulmonologist and was asked to come to emergency department On arrival to triage her O2 sats 58% on room air gotten as low as 65 at home. He did have few episodes of fevers at home  Reports CP with cough and some chest tightness Reports chronic back pain hx of infection?? Not an any antibiotics Reports some wheezing prednisone did not help with wheezing  Deneis EToH no tobacco No hemoptysis no diarrhea  Reports daily fever in AM Has   been vaccinated against COVID  and boosted had  flu shot   Initial COVID TEST  NEGATIVE  Lab Results  Component Value Date   SARSCOV2NAA NEGATIVE 07/15/2021   [place] NEGATIVE 12/04/2020   [place] NEGATIVE 11/10/2020   [place] NEGATIVE 09/11/2020     Regarding pertinent Chronic problems:     Hyperlipidemia -  on statins  Lipitor Lipid Panel     Component Value Date/Time   CHOL 112 07/01/2020 0857   TRIG 66.0 07/01/2020 0857   HDL 56.10 07/01/2020 0857   CHOLHDL 2 07/01/2020 0857   VLDL 13.2 07/01/2020 0857    LDLCALC 42 07/01/2020 0857   LDLDIRECT 64.0 10/30/2017 1502     HTN on metoprolol    DM 2 -  Lab Results  Component Value Date   HGBA1C 6.9 (H) 11/18/2020   on  PO meds  and januvia     COPD - followed by pulmonology  not  on baseline oxygen    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   Lab Results  Component Value Date   CREATININE 0.91 07/15/2021   CREATININE 0.66 (L) 03/15/2021   CREATININE 0.63 (L) 02/01/2021    While in ER:  Extensive asymmetric airspace disease on the left and a small amount of airspace disease in the right. Probable pneumonia   Started on Rocephin azithromycin    CXR - PNA    Following Medications were ordered in ER: Medications  azithromycin (ZITHROMAX) 500 mg in sodium chloride 0.9 % 250 mL IVPB (500 mg Intravenous New Bag/Given 07/15/21 1929)  cefTRIAXone (ROCEPHIN) 1 g in sodium chloride 0.9 % 100 mL IVPB (1 g Intravenous New Bag/Given 07/15/21 1930)    ________    ED Triage Vitals  Enc Vitals Group     BP  07/15/21 1623 110/63     Pulse Rate 07/15/21 1623 91     Resp 07/15/21 1623 (!) 118     Temp 07/15/21 1623 97.7 F (36.5 C)     Temp Source 07/15/21 1623 Oral     SpO2 07/15/21 1623 97 %     Weight 07/15/21 1625 170 lb (77.1 kg)     Height 07/15/21 1625 _0  (1.778 m)     Head Circumference --      Peak Flow --      Pain Score 07/15/21 1625 0     Pain Loc --      Pain Edu? --      Excl. in [place]? --   TMAX(24)@     _________________________________________ Significant initial  Findings: Abnormal Labs Reviewed  LACTIC ACID, PLASMA - Abnormal; Notable for the following components:      Result Value   Lactic Acid, Venous 2.3 (*)    All other components within normal limits  COMPREHENSIVE METABOLIC PANEL - Abnormal; Notable for the following  components:   Sodium 127 (*)    Chloride 93 (*)    Glucose, Bld 482 (*)    BUN 25 (*)    Calcium 8.7 (*)    Total Protein 6.4 (*)    Albumin 3.4 (*)    Alkaline Phosphatase 157 (*)    Total Bilirubin 1.3 (*)    All other components within normal limits  CBC WITH DIFFERENTIAL/PLATELET - Abnormal; Notable for the following components:   WBC 14.9 (*)    RBC 4.04 (*)    Hemoglobin 12.3 (*)    HCT 36.2 (*)    Neutro Abs 13.2 (*)    Lymphs Abs 0.3 (*)    Monocytes Absolute 1.2 (*)    Abs Immature Granulocytes 0.11 (*)    All other components within normal limits  PROTIME-INR - Abnormal; Notable for the following components:   Prothrombin Time 16.3 (*)    INR 1.3 (*)    All other components within normal limits  URINALYSIS, ROUTINE W REFLEX MICROSCOPIC - Abnormal; Notable for the following components:   Glucose, UA >=500 (*)    Hgb urine dipstick SMALL (*)    All other components within normal limits     _________________________ Troponin 11 ECG: Ordered Personally reviewed by me showing: HR :  94 Rhythm:  NSR,   no evidence of ischemic changes QTC 423   ____________________ This patient meets SIRS Criteria and may be septic.     The recent clinical data is shown below. Vitals:   07/15/21 1815 07/15/21 1849 07/15/21 1915 07/15/21 1945  BP: (!) 149/68 135/68 120/69 103/61  Pulse: 94 87 89 80  Resp: 20 (!) 26 (!) 27 (!) 28  Temp:      TempSrc:      SpO2: 93% 98% 97% 98%  Weight:      Height:         WBC     Component Value Date/Time   WBC 14.9 (H) 07/15/2021 1715   LYMPHSABS 0.3 (L) 07/15/2021 1715   MONOABS 1.2 (H) 07/15/2021 1715   EOSABS 0.1 07/15/2021 1715   BASOSABS 0.0 07/15/2021 1715     Lactic Acid, Venous    Component Value Date/Time   LATICACIDVEN 2.3 (HH) 07/15/2021 1836      Lactic Acid, Venous    Component Value Date/Time   LATICACIDVEN 1.5 07/15/2021 2225     Procalcitonin  0.21   UA   no  evidence of UTI      Urine  analysis:    Component Value Date/Time   COLORURINE YELLOW 07/15/2021 1636   APPEARANCEUR CLEAR 07/15/2021 1636   LABSPEC 1.024 07/15/2021 1636   PHURINE 6.0 07/15/2021 1636   GLUCOSEU >=500 (A) 07/15/2021 1636   HGBUR SMALL (A) 07/15/2021 1636   BILIRUBINUR NEGATIVE 07/15/2021 1636   BILIRUBINUR negative 10/08/2019 1430   KETONESUR NEGATIVE 07/15/2021 1636   PROTEINUR NEGATIVE 07/15/2021 1636   UROBILINOGEN negative (A) 10/08/2019 1430   UROBILINOGEN 0.2 04/08/2011 0956   NITRITE NEGATIVE 07/15/2021 1636   LEUKOCYTESUR NEGATIVE 07/15/2021 1636    Results for orders placed or performed during the hospital encounter of 07/15/21  Resp Panel by RT-PCR (Flu A&B, Covid) Nasopharyngeal Swab     Status: None   Collection Time: 07/15/21  5:17 PM   Specimen: Nasopharyngeal Swab; Nasopharyngeal(NP) swabs in vial transport medium  Result Value Ref Range Status   SARS Coronavirus 2 by RT PCR NEGATIVE NEGATIVE Final         Influenza A by PCR NEGATIVE NEGATIVE Final   Influenza B by PCR NEGATIVE NEGATIVE Final         *Note: Due to a large number of results and/or encounters for the requested time period, some results have not been displayed. A complete set of results can be found in Results Review.    _______________________________________________ Hospitalist was called for admission for  CAP acute respiratory failure  The following Work up has been ordered so far:  Orders Placed This Encounter  Procedures   Blood Culture (routine x 2)   Resp Panel by RT-PCR (Flu A&B, Covid) Nasopharyngeal Swab   DG Chest 2 View   Lactic acid, plasma   Comprehensive metabolic panel   CBC WITH DIFFERENTIAL   Protime-INR   APTT   Urinalysis, Routine w reflex microscopic   Diet NPO time specified   Cardiac monitoring   Utilize spacer/aerochamber with mdi inhaler for COVID-19 positive patients or PUI for COVID-19   If O2 Sat <94% administer O2 at 2 liters/minute via nasal cannula   Cardiac  monitoring   Document height and weight   Assess and Document Glasgow Coma Scale   Document vital signs within 1-hour of fluid bolus completion.  Notify provider of abnormal vital signs despite fluid resuscitation.   Refer to Sidebar Report: Sepsis Bundle ED/IP   Notify provider for difficulties obtaining IV access   Initiate Carrier Fluid Protocol   Consult to hospitalist   Pulse oximetry, continuous   Adult wheeze protocol - initiate by RT   Pulse oximetry, continuous   ED EKG   Insert peripheral IV X 1     OTHER Significant initial  Findings:  labs showing:    Recent Labs  Lab 07/15/21 1715  NA 127*  K 4.2  CO2 23  GLUCOSE 482*  BUN 25*  CREATININE 0.91  CALCIUM 8.7*    Cr   stable,    Lab Results  Component Value Date   CREATININE 0.91 07/15/2021   CREATININE 0.66 (L) 03/15/2021   CREATININE 0.63 (L) 02/01/2021    Recent Labs  Lab 07/15/21 1715  AST 22  ALT 17  ALKPHOS 157*  BILITOT 1.3*  PROT 6.4*  ALBUMIN 3.4*   Lab Results  Component Value Date   CALCIUM 8.7 (L) 07/15/2021   PHOS 2.3 12/12/2013    Plt: Lab Results  Component Value Date   PLT 338 07/15/2021       ABG  Component Value Date/Time   HCO3 23.4 01/28/2007 1537   TCO2 24 01/28/2007 1537       Recent Labs  Lab 07/15/21 1715  WBC 14.9*  NEUTROABS 13.2*  HGB 12.3*  HCT 36.2*  MCV 89.6  PLT 338    HG/HCT  stable,       Component Value Date/Time   HGB 12.3 (L) 07/15/2021 1715   HGB 12.8 (L) 11/18/2020 1608   HCT 36.2 (L) 07/15/2021 1715   MCV 89.6 07/15/2021 1715       .car BNP (last 3 results) Recent Labs    11/10/20 1654  BNP 68.6     DM  labs:  HbA1C: Recent Labs    09/08/20 0949 11/18/20 1610  HGBA1C 6.3* 6.9*       CBG (last 3)  No results for input(s): GLUCAP in the last 72 hours.        Cultures:    Component Value Date/Time   SDES  11/17/2020 1148    BRONCHIAL ALVEOLAR LAVAGE Performed at [facility], [place]  [address] [place], [place] [address]    SPECREQUEST  11/17/2020 1148    NONE Performed at [facility], [place] [address], [place], [place] [address]    CULT  11/17/2020 1148    NO GROWTH 2 DAYS Performed at [facility], [place] [address], [place],  [address]    REPTSTATUS 11/19/2020 FINAL 11/17/2020 1148     Radiological Exams on Admission: DG Chest 2 View  Result Date: 07/15/2021 CLINICAL DATA:  Short of breath.  Hypoxemia. EXAM: CHEST - 2 VIEW COMPARISON:  02/09/2021 FINDINGS: Extensive asymmetric airspace disease throughout the left upper lobe and left lower lobe. Small amount of airspace disease in the right lateral lung base. No pleural effusion. Heart size normal.  Vascularity normal Extensive thoracic and lumbar fusion hardware. IMPRESSION: Extensive asymmetric airspace disease on the left and a small amount of airspace disease in the right. Probable pneumonia Electronically Signed   By: Franchot Gallo M.D.   On: 07/15/2021 18:26   CT Angio Chest Pulmonary Embolism (PE) W or WO Contrast  Result Date: 07/16/2021 CLINICAL DATA:  Pulmonary embolism suspected.  High probability. EXAM: CT ANGIOGRAPHY CHEST WITH CONTRAST TECHNIQUE: Multidetector CT imaging of the chest was performed using the standard protocol during bolus administration of intravenous contrast. Multiplanar CT image reconstructions and MIPs were obtained to evaluate the vascular anatomy. CONTRAST:  28m OMNIPAQUE IOHEXOL 350 MG/ML SOLN COMPARISON:  PA and lateral chest today, chest film 02/09/2021, chest CT without contrast 12/11/2020 and CTA chest 11/10/2020. FINDINGS: Cardiovascular: There is mild cardiomegaly with right chamber predominance, RV/LV ratio 1.2 indicating likely right heart strain and prominence of the pulmonary trunk to 3.3 cm. There are segmental and subsegmental occlusive emboli in the right lower lobe posterior base and in the right upper lobe apical and posterior segments. There  may questionably be small-vessel emboli in the right middle lobe periphery but this is difficult to assess due to breathing motion. There is no pericardial effusion. There are scattered calcifications in the LAD and circumflex coronary arteries thoracic aorta. There is no aortic dissection or aneurysm. The great vessels opacify normally. Mediastinum/Nodes: There are mildly enlarged left-sided pre-vascular lymph nodes compared to the last 2 studies, for example a lymph node measuring 1.1 cm in short axis on series 4 axial 57, prominent subcarinal nodal complex now measuring 1.5 cm in short axis on axial 74, and decreased prominence of right hilar  lymph nodes, largest of these 1.4 cm in short axis on axial 67. No thyroid or esophageal mass is seen. Lungs/Pleura: Extensive interstitial and ground-glass consolidation in the left lung is noted with denser patchy consolidation in the upper half of the left upper lobe. Background mild centrilobular and paraseptal emphysematous change in the upper lobes. There are much less dense faint patchy ground-glass opacities in the right lower lobe probably post pneumonic scarring and similar to the last CT, with new areas of denser patchy ground-glass infiltrate in the anterior segment of the right upper lobe. There are increased small cystic spaces in the consolidation primarily within the left upper lobe and superior segment left lower lobe concerning for a necrotizing pneumonic process. No pulmonary abscess is seen. There are only trace pleural effusions and no loculated collection. Relatively small caliber in the central airways may suggest reactive airways disease or bronchospasm and there is mild bronchial thickening in both lower lobes. Upper Abdomen: No acute abnormality. Musculoskeletal: Additional comparison is made to a thoracic spine CT of 03/26/2021. Extensive posterior spinal fusion hardware is again noted, evidence of previously removed pedicle screws at T5, T6 and  T10 with posterior rods and pedicle screws again beginning at the level of T7. 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 Evidence of prior spondylodiscitis and vertebral body ankylosis again noted T4-5 and T9-10. No new endplate erosive disease is seen. Bilateral paravertebral soft tissue prominence along the mid to lower thoracic spine is similar. Previously described as likely phlegmon. There are no paraspinal fluid collections. Review of the MIP images confirms the above findings. IMPRESSION: 1. Segmental and subsegmental arterial emboli in the right upper and lower lobes. The overall clot burden appears small to moderate. There may be additional small vessel emboli in the right middle lobe, difficult to confirm given respiratory motion. 2. Cardiomegaly with a right chamber predominance indicating right heart strain with prominent pulmonary trunk and no further visible emboli. 3. Interstitial and extensive ground-glass and patchy consolidative change in the left lung with scattered ground-glass infiltrates in the anterior segment of the right upper lobe. Background COPD. Probable multi lobar pneumonia, previously seen in different locations with similar appearance. 4. Increased small cystic spaces in the left lung consolidation concerning for necrotizing pneumonic component. Follow-up study recommended after treatment. 5. Increased mildly enlarged mediastinal and right hilar lymph nodes likely reactive. 6. Aortic and coronary artery atherosclerosis. 7. 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 Paraspinal soft tissue fullness appear similar. Electronically Signed   By: Telford Nab M.D.   On: 07/16/2021 00:16    _______________________________________________________________________________________________________ Latest  Blood pressure 103/61, pulse 80, temperature 97.7 F (36.5 C), temperature source Oral, resp. rate (!) 28, height _0  (1.778 m), weight 77.1 kg, SpO2 98 %.   Vitals  labs and radiology finding personally reviewed  Review of Systems:    Pertinent positives include:  Fevers, chills, fatigue,  Constitutional:  No weight loss, night sweats,  weight loss  HEENT:  No headaches, Difficulty swallowing,Tooth/dental problems,Sore throat,  No sneezing, itching, ear ache, nasal congestion, post nasal drip,  Cardio-vascular:  No chest pain, Orthopnea, PND, anasarca, dizziness, palpitations.no Bilateral lower extremity swelling  GI:  No heartburn, indigestion, abdominal pain, nausea, vomiting, diarrhea, change in bowel habits, loss of  appetite, melena, blood in stool, hematemesis Resp:  no shortness of breath at rest. No dyspnea on exertion, No excess mucus, no productive cough, No non-productive cough, No coughing up of blood.No change in color of mucus.No wheezing. Skin:  no rash or lesions. No jaundice GU:  no dysuria, change in color of urine, no urgency or frequency. No straining to urinate.  No flank pain.  Musculoskeletal:  No joint pain or no joint swelling. No decreased range of motion. No back pain.  Psych:  No change in mood or affect. No depression or anxiety. No memory loss.  Neuro: no localizing neurological complaints, no tingling, no weakness, no double vision, no gait abnormality, no slurred speech, no confusion  All systems reviewed and apart from [place] all are negative _______________________________________________________________________________________________ Past Medical History:   Past Medical History:  Diagnosis Date   Acute pharyngitis 10/21/2013   Allergy    grass and pollen   Anxiety and depression 10/25/2011   BPH (benign prostatic hyperplasia)  04/23/2012   Chicken pox as a child   DDD (degenerative disc disease)    cervical responds to steroid injections and low back required surgery   DDD (degenerative disc disease), lumbosacral    Diabetes mellitus    pre   Dyspnea    ED (erectile dysfunction) 04/23/2012   Elevated BP    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   Esophageal reflux 02/10/2015   Fatigue    HTN (hypertension)    Hyperglycemia    preDM    Hyperlipidemia    Insomnia    Low back pain radiating to both legs 01/16/2017   Measles as a child   Overweight(278.02)    Peripheral neuropathy 08/07/2019   Personal history of colonic polyps 10/27/2012   Follows with [name]   Pneumonia    " walking"   Preventative health care    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   Wears glasses       Past Surgical History:  Procedure Laterality Date   BACK SURGERY  2012 and 1994   Dr Margret Chance, screws and cage in low back   BIOPSY  01/09/2020   Procedure: BIOPSY;  Surgeon: Otis Brace, MD;  Location: [facility];  Service: Gastroenterology;;   BRONCHIAL BIOPSY  11/17/2020   Procedure: BRONCHIAL BIOPSIES;  Surgeon: Laurin Coder, MD;  Location: [facility];  Service: Endoscopy;;   BRONCHIAL BRUSHINGS  11/17/2020   Procedure: BRONCHIAL BRUSHINGS;  Surgeon: Laurin Coder, MD;  Location: [facility];  Service: Endoscopy;;   BRONCHIAL WASHINGS  11/17/2020   Procedure: BRONCHIAL WASHINGS;  Surgeon: Laurin Coder, MD;  Location: [facility];  Service: Endoscopy;;   COLONOSCOPY WITH PROPOFOL N/A 01/09/2020   Procedure: COLONOSCOPY WITH PROPOFOL;  Surgeon: Otis Brace, MD;  Location: [facility];  Service: Gastroenterology;  Laterality: N/A;   ESOPHAGOGASTRODUODENOSCOPY (EGD) WITH PROPOFOL N/A 01/09/2020   Procedure: ESOPHAGOGASTRODUODENOSCOPY (EGD) WITH PROPOFOL;  Surgeon: Otis Brace, MD;  Location: [facility];  Service: Gastroenterology;  Laterality: N/A;   EYE SURGERY Bilateral    2016   HARDWARE  REVISION  03/12/2019   REVISION OF HARDWARE THORACIC TEN-THORACIC ELEVEN WITH APPLICATION OF ADDITIONAL RODS AND ROD SLEEVES THORACIC EIGHT-THORACIC TWELVE 12-LUMBAR ONE (N/A )   HEMOSTASIS CONTROL  11/17/2020   Procedure: HEMOSTASIS CONTROL;  Surgeon: Laurin Coder, MD;  Location: [facility];  Service: Endoscopy;;   IR US GUIDE BX ASP/DRAIN  11/13/2020   LAMINECTOMY  02/12/2019   DECOMPRESSIVE LAMINECTOMY THORACIC NINE-THORACIC TEN AND THORACIC TEN-THORACIC ELEVEN, EXTENSION OF THORACOLUMBAR  FUSION FROM THORACIC TEN TO THORACIC FIVE, LOCAL BONE GRAFT, ALLOGRAFT AND VIVIGEN (N/A)   POLYPECTOMY  01/09/2020   Procedure: POLYPECTOMY;  Surgeon: Otis Brace, MD;  Location: [facility];  Service: Gastroenterology;;   REMOVAL OF RODS AND PEDICLE SCREWS T5 AND T6, EXPLORATION OF FUSION, BIOPSY TRANSPEDICULAR T5 AND T6 (N/A )  09/14/2020   SPINAL FUSION N/A 02/12/2019   Procedure: DECOMPRESSIVE LAMINECTOMY THORACIC NINE-THORACIC TEN AND THORACIC TEN-THORACIC ELEVEN, EXTENSION OF THORACOLUMBAR FUSION FROM THORACIC TEN TO THORACIC FIVE, LOCAL BONE GRAFT, ALLOGRAFT AND VIVIGEN;  Surgeon: Jessy Oto, MD;  Location: [facility];  Service: Orthopedics;  Laterality: N/A;  DECOMPRESSIVE LAMINECTOMY THORACIC NINE-THORACIC TEN AND THORACIC TEN-THORACIC ELEVEN, EXTENSION OF TH   SPINAL FUSION N/A 03/12/2019   Procedure: REVISION OF HARDWARE THORACIC TEN-THORACIC ELEVEN WITH APPLICATION OF ADDITIONAL RODS AND ROD SLEEVES THORACIC EIGHT-THORACIC TWELVE 12-LUMBAR ONE;  Surgeon: Jessy Oto, MD;  Location: [facility];  Service: Orthopedics;  Laterality: N/A;   TONSILLECTOMY     as child   torn rotator cuff  2010   right   VIDEO BRONCHOSCOPY N/A 11/17/2020   Procedure: VIDEO BRONCHOSCOPY WITH FLUORO;  Surgeon: Laurin Coder, MD;  Location: [facility];  Service: Endoscopy;  Laterality: N/A;    Social History:  Ambulatory   independently      reports that he quit smoking about 32 years ago. His smoking use  included cigarettes. He has a 20.00 pack-year smoking history. He has never used smokeless tobacco. He reports that he does not currently use alcohol. He reports that he does not use drugs.     Family History:   Family History  Problem Relation Age of Onset   Hypertension Mother    Diabetes Mother        type 2   Cancer Mother 55       breast in remission   Emphysema Father        smoker   COPD Father        smoker   Stroke Father 63       mini   Heart disease Father    Hypertension Sister    Hyperlipidemia Sister    Scoliosis Sister    Osteoporosis Sister    Hypertension Maternal Grandmother    Scoliosis Maternal Grandmother    Heart disease Maternal Grandfather    Heart disease Paternal Grandfather        smoker   Heart disease Daughter    ______________________________________________________________________________________________ Allergies: Allergies  Allergen Reactions   Daptomycin     Drug induced pneumonia      Prior to Admission medications   Medication Sig Start Date End Date Taking? Authorizing Provider  albuterol (PROVENTIL HFA) 108 (90 Base) MCG/ACT inhaler TAKE 2 PUFFS BY MOUTH EVERY 6 HOURS AS NEEDED FOR WHEEZE OR SHORTNESS OF BREATH 05/03/21   Brand Males, MD  atorvastatin (LIPITOR) 10 MG tablet TAKE 1 TABLET BY MOUTH EVERY DAY 06/23/21   Saguier, Percell Miller, PA-C  cefdinir (OMNICEF) 300 MG capsule Take 1 capsule (300 mg total) by mouth 2 (two) times daily. 07/13/21   Brand Males, MD  DALVANCE 500 MG SOLR Inject into the vein. 06/04/21   [provider]  digoxin (LANOXIN) 0.125 MG tablet Take 1 tablet (0.125 mg total) by mouth daily. 01/22/21   Tobb, Kardie, DO  ferrous fumarate (FERRETTS) 325 (106 Fe) MG TABS tablet Take 1 tablet (106 mg of iron total) by mouth daily. 10/28/20   Saguier, Percell Miller, PA-C  glucose blood (  CONTOUR NEXT TEST) test strip Check blood sugar once daily 02/25/19   Saguier, Percell Miller, PA-C  metFORMIN (GLUCOPHAGE) 1000 MG  tablet TAKE 1 TABLET (1,000 MG TOTAL) BY MOUTH 2 (TWO) TIMES DAILY WITH A MEAL. 06/18/21   Saguier, Percell Miller, PA-C  metoprolol tartrate (LOPRESSOR) 100 MG tablet Take 1 tablet (100 mg total) two hours prior to CT testing 04/06/21   Camnitz, Ocie Doyne, MD  oxyCODONE-acetaminophen (PERCOCET/ROXICET) 5-325 MG tablet Take 1 tablet by mouth every 4 (four) hours as needed. 06/14/21   [provider]  pantoprazole (PROTONIX) 40 MG tablet TAKE 1 TABLET BY MOUTH TWICE A DAY BEFORE A MEAL 12/21/20   Saguier, Percell Miller, PA-C  predniSONE (DELTASONE) 10 MG tablet Take 4 tablets (40 mg total) by mouth daily with breakfast for 3 days, THEN 3 tablets (30 mg total) daily with breakfast for 3 days, THEN 2 tablets (20 mg total) daily with breakfast for 3 days, THEN 1 tablet (10 mg total) daily with breakfast for 3 days. 07/08/21 07/20/21  Martyn Ehrich, NP  pregabalin (LYRICA) 75 MG capsule TAKE 1 CAPSULE BY MOUTH TWICE A DAY 03/09/20   Jessy Oto, MD  rivaroxaban (XARELTO) 20 MG TABS tablet Take 1 tablet (20 mg total) by mouth daily with supper. 02/24/21   Park Liter, MD  sitaGLIPtin (JANUVIA) 50 MG tablet Take 1 tablet (50 mg total) by mouth daily. 06/18/21   Saguier, Percell Miller, PA-C  STIOLTO RESPIMAT 2.5-2.5 MCG/ACT AERS SMARTSIG:2 Puff(s) Via Inhaler Daily 06/18/21   [provider]  sucralfate (CARAFATE) 1 g tablet TAKE 1 TABLET (1 G TOTAL) BY MOUTH 4 (FOUR) TIMES DAILY - WITH MEALS AND AT BEDTIME. 12/10/20   Saguier, Percell Miller, PA-C  tamsulosin (FLOMAX) 0.4 MG CAPS capsule Take 2 capsules (0.8 mg total) by mouth daily. 05/24/21   Saguier, Percell Miller, PA-C  testosterone cypionate (DEPOTESTOSTERONE CYPIONATE) 200 MG/ML injection INJECT 1 ML (200 MG TOTAL) INTO THE MUSCLE EVERY 14 DAYS 05/24/21   Saguier, Percell Miller, PA-C  tiZANidine (ZANAFLEX) 4 MG tablet TAKE 1.5 TABLETS (6 MG TOTAL) BY MOUTH EVERY 6 (SIX) HOURS AS NEEDED FOR MUSCLE SPASMS. 07/13/21   Jessy Oto, MD  venlafaxine XR (EFFEXOR-XR) 150 MG 24 hr  capsule TAKE 1 CAPSULE BY MOUTH DAILY WITH BREAKFAST. 07/15/21   Saguier, Percell Miller, PA-C  zolpidem (AMBIEN) 5 MG tablet Take 1 tablet (5 mg total) by mouth at bedtime as needed. for sleep 07/02/21   Mackie Pai, PA-C    ___________________________________________________________________________________________________ Physical Exam: Vitals with BMI 07/15/2021 07/15/2021 07/15/2021  Height - - -  Weight - - -  BMI - - -  Systolic 795 583 167  Diastolic 61 69 68  Pulse 80 89 87     1. General:  in  increased work of breathing    Chronically ill   -appearing 2. Psychological: Alert and  Oriented 3. Head/ENT:    Dry Mucous Membranes                          Head Non traumatic, neck supple                           Poor Dentition 4. SKIN:  decreased Skin turgor,  Skin clean Dry and intact no rash 5. Heart: Regular rate and rhythm no Murmur, no Rub or gallop 6. Lungs:   no wheezes or crackles   7. Abdomen: Soft,  non-tender, Non distended  bowel sounds present 8. Lower extremities: no clubbing, cyanosis, no  edema 9. Neurologically Grossly intact, moving all 4 extremities equally  10. MSK: Normal range of motion    Chart has been reviewed  ______________________________________________________________________________________________  Assessment/Plan 75 y.o. male with medical history significant of COPD, diabetes, HLD, depression  Admitted for Sepsis and PNA, PE and bronchiectasis exacerbtion  Present on Admission:  Essential hypertension  Familial multiple lipoprotein-type hyperlipidemia  Drug-induced pneumonitis  Acute respiratory failure with hypoxia (HCC)  COPD with acute exacerbation ([place])  CAP (community acquired pneumonia)  Paroxysmal A-fib ([place])  Sepsis ([place])  Hyponatremia  Pulmonary emboli (HCC)  Bronchiectasis with acute exacerbation ([place])     Diabetes mellitus without complication ([place])  - Order Sensitive  SSI     -  check TSH and HgA1C  - Hold by  mouth medications     Essential hypertension Allow permissive HTN for now  CAP (community acquired pneumonia)  - -Patient presenting with productive cough, fever     Hypoxia  And bilateral  infiltrate  on chest x-ray -Infiltrate on CXR and 2-3 characteristics (fever, leukocytosis, purulent sputum) are consistent with pneumonia. -This appears to be most likely community-acquired pneumonia.     will admit for treatment of CAP will start on appropriate antibiotic coverage. -  Rocephin/azithromycin   Obtain:  sputum cultures,                  Obtain respiratory panel                 influenza serologies negative                  COVID PCR negative                  blood cultures and sputum cultures ordered                   strep pneumo UA antigen,                    check for Legionella antigen.                  MRSA pCR                Provide oxygen as needed.   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  If infiltrates do not improve may need additional imaging and/or pulmonology consult  COPD with acute exacerbation ([place]) Continue prednisone albuterol inhaler continue home meds  Acute respiratory failure with hypoxia ([place])  this patient has acute respiratory failure with Hypoxia  as documented by the presence of following: O2 saturatio< 90% on RA Likely due to:  Pneumonia COPD exacerbation  Provide O2 therapy and titrate as needed  Continuous pulse ox   check Pulse ox with ambulation prior to discharge   may need  TC consult for home O2 set up    flutter valve ordered   History of DVT (deep vein thrombosis) Currently with PE.  Switch to heparin obtain Dopplers  Paroxysmal A-fib (HCC) Given sepsis hold metoprolol unless able to tolerate.  Check digoxin level restart if able.   Continue Xarelto  Sepsis ([place])  -SIRS criteria met with elevated white blood cell count,       Component Value Date/Time   WBC 14.9 (H) 07/15/2021 1715  LYMPHSABS 0.3 (L) 07/15/2021 1715      RR >20 fever at home Today's Vitals   07/15/21 1815 07/15/21 1849 07/15/21 1915 07/15/21 1945  BP: (!) 149/68 135/68 120/69 103/61  Pulse: 94 87 89 80  Resp: 20 (!) 26 (!) 27 (!) 28  Temp:      TempSrc:      SpO2: 93% 98% 97% 98%  Weight:      Height:      PainSc:         This patient meets SIRS Criteria and may be septic     The recent clinical data is shown below. Vitals:   07/15/21 1815 07/15/21 1849 07/15/21 1915 07/15/21 1945  BP: (!) 149/68 135/68 120/69 103/61  Pulse: 94 87 89 80  Resp: 20 (!) 26 (!) 27 (!) 28  Temp:      TempSrc:      SpO2: 93% 98% 97% 98%  Weight:      Height:           -Most likely source being: pulmonary,    Patient meeting criteria for Severe sepsis with    evidence of end organ damage/organ dysfunction such as    elevated lactic acid >2     Component Value Date/Time   LATICACIDVEN 2.3 (HH) 07/15/2021 1836      SBP<90 mmhg or MAP < 65 mmhg,   Acute hypoxia requiring new supplemental oxygen, SpO2: 98 % O2 Flow Rate (L/min): 3 L/min        - Obtain serial lactic acid and procalcitonin level.  - Initiated IV antibiotics in ER: Antibiotics Given (last 72 hours)     Date/Time Action Medication Dose Rate   07/15/21 1929 New Bag/Given   azithromycin (ZITHROMAX) 500 mg in sodium chloride 0.9 % 250 mL IVPB 500 mg 250 mL/hr   07/15/21 1930 New Bag/Given   cefTRIAXone (ROCEPHIN) 1 g in sodium chloride 0.9 % 100 mL IVPB 1 g 200 mL/hr       Will continue    - await results of blood and urine culture  - Rehydrate aggressively  Intravenous fluids were administered         8:53 PM   Hyponatremia Obtain urine electrolytes rehydrate and follow  Pulmonary emboli (HCC)  Admit to stepdown Initiate heparin drip  Would likely benefit from  case manager consult for long term anticoagulation Hold home blood pressure medications avoid hypotension Cycle cardiac enzymes Order echogram and lower extremity Dopplers  Most likely risk factors for hypercoagulable state  Unknown On xarelto and reports being compliant  Would benefit from hypercoagulable workup as an outpatient  discussed with PCCM   Bronchiectasis with acute exacerbation ([place]) Discussed with PCCM will start on high-dose Solu-Medrol Then 73m  every 6 hours Pulmonary is aware will continue to see patient in consult    Other plan as per orders.  DVT prophylaxis:  xarelto    Code Status:    Code Status: Prior FULL CODE as per patient   I had personally discussed CODE STATUS with patient     Family Communication:   Family not at  Bedside    Disposition Plan:     To home once workup is complete and patient is stable   Following barriers for discharge:                            Electrolytes corrected  Afebrile, white count improving able to transition to PO antibiotics                             Will need to be able to tolerate PO                            Will likely need home health, home O2, set up                           Will need consultants to evaluate patient prior to discharge      Consults called:   pulmonology consulted  Admission status:  ED Disposition     None         inpatient     I Expect 2 midnight stay secondary to severity of patient's current illness need for inpatient interventions justified by the following:  hemodynamic instability despite optimal treatment (tachycardia  hypoxia,  )  Severe lab/radiological/exam abnormalities including:    PNA and extensive comorbidities including:  Chronic pain  DM2    COPD/asthma  Chronic anticoagulation  That are currently affecting medical management.   I expect  patient to be hospitalized for 2 midnights requiring  inpatient medical care.  Patient is at high risk for adverse outcome (such as loss of life or disability) if not treated.  Indication for inpatient stay as follows:      New or worsening hypoxia   Need for IV antibiotics, IV fluids     Level of care  progressive tele indefinitely please discontinue once patient no longer qualifies COVID-19 Labs    Lab Results  Component Value Date   [place] 07/15/2021     Precautions: admitted as   Covid Negative     Deagan Sevin 07/16/2021, 1:13 AM     [facility]     after 2 AM please page floor coverage PA If 7AM-7PM, please contact the day team taking care of the patient using [email or web address]   Patient was evaluated in the context of the global COVID-19 pandemic, which necessitated consideration that the patient might be at risk for infection with the SARS-CoV-2 virus that causes COVID-19. Institutional protocols and algorithms that pertain to the evaluation of patients at risk for COVID-19 are in a state of rapid change based on information released by regulatory bodies including the CDC and federal and state organizations. These policies and algorithms were followed during the patient's care.

## 2021-07-15 NOTE — Subjective & Objective (Signed)
Weight 1 week history of worsening shortness of breath worse with exertion.  Had a COVID test initiated was negative.  Called in to use pulmonologist on 22nd December and was prescribed a course of prednisone.  Noticed now that his oxygen saturations started to deepen to 80s.  Yesterday was started on antibiotics but persistently had shortness of breath and hypoxia call back to his pulmonologist and was asked to come to emergency department On arrival to triage her O2 sats 58% on room air gotten as low as 65 at home. He did have few episodes of fevers at home

## 2021-07-15 NOTE — Assessment & Plan Note (Signed)
-   Order Sensitive  SSI     -  check TSH and HgA1C  - Hold by mouth medications*  

## 2021-07-15 NOTE — Assessment & Plan Note (Signed)
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  If infiltrates do not improve may need additional imaging and/or pulmonology consult

## 2021-07-15 NOTE — Assessment & Plan Note (Signed)
Given sepsis hold metoprolol unless able to tolerate.  Check digoxin level restart if able.  Continue Xarelto

## 2021-07-15 NOTE — Assessment & Plan Note (Addendum)
- -  Patient presenting with productive cough, fever     Hypoxia  And bilateral  infiltrate  on chest x-ray -Infiltrate on CXR and 2-3 characteristics (fever, leukocytosis, purulent sputum) are consistent with pneumonia. -This appears to be most likely community-acquired pneumonia.     will admit for treatment of CAP will start on appropriate antibiotic coverage. -  Rocephin/azithromycin   Obtain:  sputum cultures,                  Obtain respiratory panel                 influenza serologies negative                  COVID PCR negative                  blood cultures and sputum cultures ordered                   strep pneumo UA antigen,                    check for Legionella antigen.                  MRSA pCR                Provide oxygen as needed.   Will broaden antibiotics to cefepime, vanc and azythro Given bronchiectasis 1:59 AM

## 2021-07-15 NOTE — Assessment & Plan Note (Addendum)
Currently with PE.  Switch to heparin obtain Dopplers

## 2021-07-15 NOTE — Assessment & Plan Note (Signed)
Continue prednisone albuterol inhaler continue home meds

## 2021-07-15 NOTE — Assessment & Plan Note (Signed)
-  SIRS criteria met with elevated white blood cell count,       Component Value Date/Time   WBC 14.9 (H) 07/15/2021 1715   LYMPHSABS 0.3 (L) 07/15/2021 1715      RR >20 fever at home Today's Vitals   07/15/21 1815 07/15/21 1849 07/15/21 1915 07/15/21 1945  BP: (!) 149/68 135/68 120/69 103/61  Pulse: 94 87 89 80  Resp: 20 (!) 26 (!) 27 (!) 28  Temp:      TempSrc:      SpO2: 93% 98% 97% 98%  Weight:      Height:      PainSc:         This patient meets SIRS Criteria and may be septic     The recent clinical data is shown below. Vitals:   07/15/21 1815 07/15/21 1849 07/15/21 1915 07/15/21 1945  BP: (!) 149/68 135/68 120/69 103/61  Pulse: 94 87 89 80  Resp: 20 (!) 26 (!) 27 (!) 28  Temp:      TempSrc:      SpO2: 93% 98% 97% 98%  Weight:      Height:           -Most likely source being: pulmonary,    Patient meeting criteria for Severe sepsis with    evidence of end organ damage/organ dysfunction such as    elevated lactic acid >2     Component Value Date/Time   LATICACIDVEN 2.3 (HH) 07/15/2021 1836      SBP<90 mmhg or MAP < 65 mmhg,   Acute hypoxia requiring new supplemental oxygen, SpO2: 98 % O2 Flow Rate (L/min): 3 L/min        - Obtain serial lactic acid and procalcitonin level.  - Initiated IV antibiotics in ER: Antibiotics Given (last 72 hours)    Date/Time Action Medication Dose Rate   07/15/21 1929 New Bag/Given   azithromycin (ZITHROMAX) 500 mg in sodium chloride 0.9 % 250 mL IVPB 500 mg 250 mL/hr   07/15/21 1930 New Bag/Given   cefTRIAXone (ROCEPHIN) 1 g in sodium chloride 0.9 % 100 mL IVPB 1 g 200 mL/hr      Will continue    - await results of blood and urine culture  - Rehydrate aggressively  Intravenous fluids were administered         8:53 PM

## 2021-07-15 NOTE — ED Provider Notes (Signed)
[facility] DEPT Provider Note   CSN: [id number] Arrival date & time: 07/15/21  1612     History Chief Complaint  Patient presents with   Low O2 Sats    Luis Brown is a 75 y.o. male.  HPI Patient with shortness of breath.  Has had a cough for a while now.  Has had around 3 weeks.  Had been placed on antibiotics yesterday by pulmonology.  Had a previous pulmonary visit where they gave steroids and symptomatic treatment.  Has been worse over the last few days.  States now he is so weak he had not been able to get out of here.  Had sats of 58% in triage and 65% at home.  Has a pulmonologist because he had a reported pneumonia caused by a medicine.  States he had been cleared by pulmonology after that episode however.  Fevers at home.    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   Esophageal reflux 02/10/2015   Fatigue    HTN (hypertension)    Hyperglycemia    preDM    Hyperlipidemia    Insomnia    Low back pain radiating to both legs 01/16/2017   Measles as a child   Overweight(278.02)    Peripheral neuropathy 08/07/2019   Personal history of colonic polyps 10/27/2012   Follows with [facility]   Pneumonia    " walking"   Preventative health care    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   Wears glasses     Patient Active Problem List   Diagnosis Date Noted   COPD with acute exacerbation ([place]) 02/10/2021   Palpitations 02/01/2021   Wears glasses 01/11/2021   Pneumothorax, right 12/15/2020    Anxiety 12/01/2020   SOB (shortness of breath)    DVT (deep venous thrombosis) ([place]) 11/16/2020   Acute respiratory failure with hypoxia ([place]) 11/14/2020   Fluid collection at surgical site    Wound infection    Sepsis ([place]) 11/11/2020   Drug-induced pneumonitis 11/10/2020   S/P bronchoscopy 09/14/2020   Hyperglycemia 03/22/2020   Gastric ulcer 01/27/2020   H/O colonoscopy with polypectomy 01/27/2020   Protein-calorie malnutrition, severe 01/09/2020   Weight loss 01/07/2020   Abdominal pain 12/30/2019   Nausea and vomiting 12/30/2019   Weight loss, abnormal 12/30/2019   Change in bowel habits 12/30/2019   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   Medication monitoring encounter 08/26/2019   Peripheral neuropathy 08/07/2019   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   Fusion of spine of thoracolumbar region 03/12/2019   Status post thoracic spinal fusion 02/12/2019   Thoracic spinal stenosis 04/17/2017   Painful orthopaedic hardware ([place]) 04/17/2017   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 ([place]) 04/17/2017   Low back pain radiating to both legs 01/16/2017   Esophageal reflux 02/10/2015   Essential hypertension 06/25/2014   Asthma 06/25/2014   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   Overweight 06/25/2014   Other abnormal  glucose 06/25/2014   Spinal stenosis of lumbar region without neurogenic claudication 04/09/2014   Flatback syndrome 04/09/2014   Chicken pox 10/21/2013   History of colonic polyps 10/27/2012   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   Benign enlargement of prostate 04/23/2012   ED (erectile dysfunction) 04/23/2012   Allergic state 10/25/2011   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   Diabetes mellitus without complication (HCC)    Insomnia    Fatigue    Preventative health care    DDD (degenerative disc disease), lumbosacral    Thoracic degenerative disc  disease     Past Surgical History:  Procedure Laterality Date   BACK SURGERY  2012 and 1994   Dr Margret Chance, screws and cage in low back   BIOPSY  01/09/2020   Procedure: BIOPSY;  Surgeon: Otis Brace, MD;  Location: [facility];  Service: Gastroenterology;;   BRONCHIAL BIOPSY  11/17/2020   Procedure: BRONCHIAL BIOPSIES;  Surgeon: Laurin Coder, MD;  Location: [facility];  Service: Endoscopy;;   BRONCHIAL BRUSHINGS  11/17/2020   Procedure: BRONCHIAL BRUSHINGS;  Surgeon: Laurin Coder, MD;  [facility]: [facility];  Service: Endoscopy;;   BRONCHIAL WASHINGS  11/17/2020   Procedure: BRONCHIAL WASHINGS;  Surgeon: Laurin Coder, MD;  Location: [facility];  Service: Endoscopy;;   COLONOSCOPY WITH PROPOFOL N/A 01/09/2020   Procedure: COLONOSCOPY WITH PROPOFOL;  Surgeon: Otis Brace, MD;  Location: [facility];  Service: Gastroenterology;  Laterality: N/A;   ESOPHAGOGASTRODUODENOSCOPY (EGD) WITH PROPOFOL N/A 01/09/2020   Procedure: ESOPHAGOGASTRODUODENOSCOPY (EGD) WITH PROPOFOL;  Surgeon: Otis Brace, MD;  [facility]: [facility];  Service: Gastroenterology;  Laterality: N/A;   EYE SURGERY Bilateral    2016   HARDWARE REVISION  03/12/2019   REVISION OF HARDWARE THORACIC TEN-THORACIC ELEVEN WITH APPLICATION OF ADDITIONAL RODS AND ROD SLEEVES THORACIC EIGHT-THORACIC TWELVE 12-LUMBAR ONE (N/A )   HEMOSTASIS CONTROL  11/17/2020   Procedure: HEMOSTASIS CONTROL;  Surgeon: Laurin Coder, MD;  [facility];  Service: Endoscopy;;   IR US GUIDE BX ASP/DRAIN  11/13/2020   LAMINECTOMY  02/12/2019   DECOMPRESSIVE LAMINECTOMY THORACIC NINE-THORACIC TEN AND THORACIC TEN-THORACIC ELEVEN, EXTENSION OF THORACOLUMBAR FUSION FROM THORACIC TEN TO THORACIC FIVE, LOCAL BONE GRAFT, ALLOGRAFT AND VIVIGEN (N/A)   POLYPECTOMY  01/09/2020   Procedure: POLYPECTOMY;  Surgeon: Otis Brace, MD;  Location: [facility];  Service: Gastroenterology;;   REMOVAL OF RODS AND PEDICLE SCREWS  T5 AND T6, EXPLORATION OF FUSION, BIOPSY TRANSPEDICULAR T5 AND T6 (N/A )  09/14/2020   SPINAL FUSION N/A 02/12/2019   Procedure: DECOMPRESSIVE LAMINECTOMY THORACIC NINE-THORACIC TEN AND THORACIC TEN-THORACIC ELEVEN, EXTENSION OF THORACOLUMBAR FUSION FROM THORACIC TEN TO THORACIC FIVE, LOCAL BONE GRAFT, ALLOGRAFT AND VIVIGEN;  Surgeon: Jessy Oto, MD;  Location: [facility];  Service: Orthopedics;  Laterality: N/A;  DECOMPRESSIVE LAMINECTOMY THORACIC NINE-THORACIC TEN AND THORACIC TEN-THORACIC ELEVEN, EXTENSION OF TH   SPINAL FUSION N/A 03/12/2019   Procedure: REVISION OF HARDWARE THORACIC TEN-THORACIC ELEVEN WITH APPLICATION OF ADDITIONAL RODS AND ROD SLEEVES THORACIC EIGHT-THORACIC TWELVE 12-LUMBAR ONE;  Surgeon: Jessy Oto, MD;  Location: [facility];  Service: Orthopedics;  Laterality: N/A;   TONSILLECTOMY     as child   torn rotator cuff  2010   right   VIDEO BRONCHOSCOPY N/A 11/17/2020   Procedure: VIDEO BRONCHOSCOPY WITH FLUORO;  Surgeon: Laurin Coder, MD;  [facility]: [facility];  Service: Endoscopy;  Laterality: N/A;       Family History  Problem Relation Age of Onset  Hypertension Mother    Diabetes Mother        type 2   Cancer Mother 51       breast in remission   Emphysema Father        smoker   COPD Father        smoker   Stroke Father 13       mini   Heart disease Father    Hypertension Sister    Hyperlipidemia Sister    Scoliosis Sister    Osteoporosis Sister    Hypertension Maternal Grandmother    Scoliosis Maternal Grandmother    Heart disease Maternal Grandfather    Heart disease Paternal Grandfather        smoker   Heart disease Daughter     Social History   Tobacco Use   Smoking status: Former    Packs/day: 1.00    Years: 20.00    Pack years: 20.00    Types: Cigarettes    Quit date: 07/18/1989    Years since quitting: 32.0   Smokeless tobacco: Never  Vaping Use   Vaping Use: Never used  Substance Use Topics   Alcohol use: Not Currently   Drug  use: No    Home Medications Prior to Admission medications   Medication Sig Start Date End Date Taking? Authorizing Provider  albuterol (PROVENTIL HFA) 108 (90 Base) MCG/ACT inhaler TAKE 2 PUFFS BY MOUTH EVERY 6 HOURS AS NEEDED FOR WHEEZE OR SHORTNESS OF BREATH 05/03/21   Brand Males, MD  atorvastatin (LIPITOR) 10 MG tablet TAKE 1 TABLET BY MOUTH EVERY DAY 06/23/21   Saguier, Percell Miller, PA-C  cefdinir (OMNICEF) 300 MG capsule Take 1 capsule (300 mg total) by mouth 2 (two) times daily. 07/13/21   Brand Males, MD  DALVANCE 500 MG SOLR Inject into the vein. 06/04/21   [provider]  digoxin (LANOXIN) 0.125 MG tablet Take 1 tablet (0.125 mg total) by mouth daily. 01/22/21   Tobb, Kardie, DO  ferrous fumarate (FERRETTS) 325 (106 Fe) MG TABS tablet Take 1 tablet (106 mg of iron total) by mouth daily. 10/28/20   Saguier, Percell Miller, PA-C  glucose blood (CONTOUR NEXT TEST) test strip Check blood sugar once daily 02/25/19   Saguier, Percell Miller, PA-C  metFORMIN (GLUCOPHAGE) 1000 MG tablet TAKE 1 TABLET (1,000 MG TOTAL) BY MOUTH 2 (TWO) TIMES DAILY WITH A MEAL. 06/18/21   Saguier, Percell Miller, PA-C  metoprolol tartrate (LOPRESSOR) 100 MG tablet Take 1 tablet (100 mg total) two hours prior to CT testing 04/06/21   Camnitz, Ocie Doyne, MD  oxyCODONE-acetaminophen (PERCOCET/ROXICET) 5-325 MG tablet Take 1 tablet by mouth every 4 (four) hours as needed. 06/14/21   [provider]  pantoprazole (PROTONIX) 40 MG tablet TAKE 1 TABLET BY MOUTH TWICE A DAY BEFORE A MEAL 12/21/20   Saguier, Percell Miller, PA-C  predniSONE (DELTASONE) 10 MG tablet Take 4 tablets (40 mg total) by mouth daily with breakfast for 3 days, THEN 3 tablets (30 mg total) daily with breakfast for 3 days, THEN 2 tablets (20 mg total) daily with breakfast for 3 days, THEN 1 tablet (10 mg total) daily with breakfast for 3 days. 07/08/21 07/20/21  Martyn Ehrich, NP  pregabalin (LYRICA) 75 MG capsule TAKE 1 CAPSULE BY MOUTH TWICE A DAY 03/09/20    Jessy Oto, MD  rivaroxaban (XARELTO) 20 MG TABS tablet Take 1 tablet (20 mg total) by mouth daily with supper. 02/24/21   Park Liter, MD  sitaGLIPtin (JANUVIA) 50 MG tablet  Take 1 tablet (50 mg total) by mouth daily. 06/18/21   Saguier, Percell Miller, PA-C  STIOLTO RESPIMAT 2.5-2.5 MCG/ACT AERS SMARTSIG:2 Puff(s) Via Inhaler Daily 06/18/21   [provider]  sucralfate (CARAFATE) 1 g tablet TAKE 1 TABLET (1 G TOTAL) BY MOUTH 4 (FOUR) TIMES DAILY - WITH MEALS AND AT BEDTIME. 12/10/20   Saguier, Percell Miller, PA-C  tamsulosin (FLOMAX) 0.4 MG CAPS capsule Take 2 capsules (0.8 mg total) by mouth daily. 05/24/21   Saguier, Percell Miller, PA-C  testosterone cypionate (DEPOTESTOSTERONE CYPIONATE) 200 MG/ML injection INJECT 1 ML (200 MG TOTAL) INTO THE MUSCLE EVERY 14 DAYS 05/24/21   Saguier, Percell Miller, PA-C  tiZANidine (ZANAFLEX) 4 MG tablet TAKE 1.5 TABLETS (6 MG TOTAL) BY MOUTH EVERY 6 (SIX) HOURS AS NEEDED FOR MUSCLE SPASMS. 07/13/21   Jessy Oto, MD  venlafaxine XR (EFFEXOR-XR) 150 MG 24 hr capsule TAKE 1 CAPSULE BY MOUTH DAILY WITH BREAKFAST. 07/15/21   Saguier, Percell Miller, PA-C  zolpidem (AMBIEN) 5 MG tablet Take 1 tablet (5 mg total) by mouth at bedtime as needed. for sleep 07/02/21   Saguier, Percell Miller, PA-C    Allergies    Daptomycin  Review of Systems   Review of Systems  Constitutional:  Positive for appetite change and fatigue.  HENT:  Negative for congestion.   Respiratory:  Positive for cough and shortness of breath.   Cardiovascular:  Negative for chest pain.  Gastrointestinal:  Negative for abdominal pain.  Genitourinary:  Negative for flank pain.  Musculoskeletal:  Negative for back pain.  Skin:  Negative for rash.  Neurological:  Negative for weakness.  Psychiatric/Behavioral:  Negative for confusion.    Physical Exam Updated Vital Signs BP 103/61    Pulse 80    Temp 97.7 F (36.5 C) (Oral)    Resp (!) 28    Ht 5\' 10"  (1.778 m)    Wt 77.1 kg    SpO2 98%    BMI 24.39 kg/m    Physical Exam Vitals and nursing note reviewed.  HENT:     Head: Atraumatic.     Mouth/Throat:     Mouth: Mucous membranes are moist.  Eyes:     Pupils: Pupils are equal, round, and reactive to light.  Cardiovascular:     Rate and Rhythm: Regular rhythm.  Pulmonary:     Comments: Tachypnea with harsh breath sounds diffusely but worse on the left. Abdominal:     Tenderness: There is no abdominal tenderness.  Musculoskeletal:        General: No tenderness.     Cervical back: Neck supple.     Right lower leg: No edema.     Left lower leg: No edema.  Skin:    General: Skin is warm.     Capillary Refill: Capillary refill takes less than 2 seconds.  Neurological:     Mental Status: He is alert and oriented to person, place, and time.    ED Results / Procedures / Treatments   Labs (all labs ordered are listed, but only abnormal results are displayed) Labs Reviewed  LACTIC ACID, PLASMA - Abnormal; Notable for the following components:      Result Value   Lactic Acid, Venous 2.3 (*)    All other components within normal limits  COMPREHENSIVE METABOLIC PANEL - Abnormal; Notable for the following components:   Sodium 127 (*)    Chloride 93 (*)    Glucose, Bld 482 (*)    BUN 25 (*)    Calcium 8.7 (*)  Total Protein 6.4 (*)    Albumin 3.4 (*)    Alkaline Phosphatase 157 (*)    Total Bilirubin 1.3 (*)    All other components within normal limits  CBC WITH DIFFERENTIAL/PLATELET - Abnormal; Notable for the following components:   WBC 14.9 (*)    RBC 4.04 (*)    Hemoglobin 12.3 (*)    HCT 36.2 (*)    Neutro Abs 13.2 (*)    Lymphs Abs 0.3 (*)    Monocytes Absolute 1.2 (*)    Abs Immature Granulocytes 0.11 (*)    All other components within normal limits  PROTIME-INR - Abnormal; Notable for the following components:   Prothrombin Time 16.3 (*)    INR 1.3 (*)    All other components within normal limits  URINALYSIS, ROUTINE W REFLEX MICROSCOPIC - Abnormal; Notable for the  following components:   Glucose, UA >=500 (*)    Hgb urine dipstick SMALL (*)    All other components within normal limits  RESP PANEL BY RT-PCR (FLU A&B, COVID) ARPGX2  CULTURE, BLOOD (ROUTINE X 2)  CULTURE, BLOOD (ROUTINE X 2)  LACTIC ACID, PLASMA  APTT    EKG None  Radiology DG Chest 2 View  Result Date: 07/15/2021 CLINICAL DATA:  Short of breath.  Hypoxemia. EXAM: CHEST - 2 VIEW COMPARISON:  02/09/2021 FINDINGS: Extensive asymmetric airspace disease throughout the left upper lobe and left lower lobe. Small amount of airspace disease in the right lateral lung base. No pleural effusion. Heart size normal.  Vascularity normal Extensive thoracic and lumbar fusion hardware. IMPRESSION: Extensive asymmetric airspace disease on the left and a small amount of airspace disease in the right. Probable pneumonia Electronically Signed   By: Franchot Gallo M.D.   On: 07/15/2021 18:26    Procedures Procedures   Medications Ordered in ED Medications  azithromycin (ZITHROMAX) 500 mg in sodium chloride 0.9 % 250 mL IVPB (500 mg Intravenous New Bag/Given 07/15/21 1929)  cefTRIAXone (ROCEPHIN) 1 g in sodium chloride 0.9 % 100 mL IVPB (1 g Intravenous New Bag/Given 07/15/21 1930)    ED Course  I have reviewed the triage vital signs and the nursing notes.  Pertinent labs & imaging results that were available during my care of the patient were reviewed by me and considered in my medical decision making (see chart for details).    MDM Rules/Calculators/A&P                         Patient with shortness of breath and cough.  Has had for the last 3 weeks but worse the last couple days.  Has not had x-ray done before this.  X-ray done here shows extensive left-sided pneumonia and may be some on the right.  Did have previous history of pneumonia that was per patient drug-induced.  Somewhat severely hypoxic on room air.  Improved with nasal cannula.  Labs rather reassuring except for a glucose of nearly  500.  Also white count of almost 15.  Glucose could be related to the steroids he has been on.  Does not appear to be in DKA.  Will admit to internal medicine.    Final Clinical Impression(s) / ED Diagnoses Final diagnoses:  Community acquired pneumonia, unspecified laterality    Rx / DC Orders ED Discharge Orders     None        Davonna Belling, MD 07/15/21 2005

## 2021-07-15 NOTE — Assessment & Plan Note (Signed)
Allow permissive HTN for now

## 2021-07-15 NOTE — ED Provider Notes (Signed)
Emergency Medicine Provider Triage Evaluation Note  Luis Brown , a 75 y.o. male  was evaluated in triage.  Pt presents today with concern for 5 days of shortness of breath decreased oxygen saturation into the 60s at home today.  Has been febrile with T-max 101 F at home but has had gradually worsening shortness of breath for the last 3 weeks.  Has been on antibiotics for the last 2 days as well as prednisone but is not improving.  Was hypoxic to the 58% on room air upon arrival to the emergency department.  Oxygen saturation improved to 94% now on 3 L supplemental oxygen by nasal cannula.  Review of Systems  Positive: Shortness of breath, nonproductive cough, dyspnea on exertion Negative: Chest pain, syncope  Physical Exam  BP 110/63 (BP Location: Left Arm)    Pulse 91    Temp 97.7 F (36.5 C) (Oral)    Resp (!) 118    Ht 5\' 10"  (1.778 m)    Wt 77.1 kg    SpO2 97%    BMI 24.39 kg/m  Gen:   Awake, increased work of breathing Resp:  Tachypneic, requiring 3 L supplemental oxygen by nasal cannula to maintain sats greater than 90% MSK:   Moves extremities without difficulty  Other:  No lower extremity edema, RRR no M/R/G.  Medical Decision Making  Medically screening exam initiated at 4:42 PM.  Appropriate orders placed.  ARCHIE SHEA was informed that the remainder of the evaluation will be completed by another provider, this initial triage assessment does not replace that evaluation, and the importance of remaining in the ED until their evaluation is complete.   Patient to be prioritized for him given degree of hypoxia.  Work-up initiated.  Charge RN aware.  This chart was dictated using voice recognition software, Dragon. Despite the best efforts of this provider to proofread and correct errors, errors may still occur which can change documentation meaning.    Aura Dials 07/15/21 1644    Davonna Belling, MD 07/16/21 [phone]

## 2021-07-16 ENCOUNTER — Inpatient Hospital Stay (HOSPITAL_COMMUNITY): Payer: Medicare Other

## 2021-07-16 ENCOUNTER — Encounter (HOSPITAL_COMMUNITY): Payer: Self-pay | Admitting: Student

## 2021-07-16 DIAGNOSIS — I2699 Other pulmonary embolism without acute cor pulmonale: Secondary | ICD-10-CM

## 2021-07-16 DIAGNOSIS — I2694 Multiple subsegmental pulmonary emboli without acute cor pulmonale: Secondary | ICD-10-CM | POA: Diagnosis not present

## 2021-07-16 DIAGNOSIS — J471 Bronchiectasis with (acute) exacerbation: Secondary | ICD-10-CM | POA: Insufficient documentation

## 2021-07-16 DIAGNOSIS — A419 Sepsis, unspecified organism: Secondary | ICD-10-CM | POA: Diagnosis present

## 2021-07-16 DIAGNOSIS — R6520 Severe sepsis without septic shock: Secondary | ICD-10-CM

## 2021-07-16 LAB — CBC WITH DIFFERENTIAL/PLATELET
Abs Immature Granulocytes: 0.08 K/uL — ABNORMAL HIGH (ref 0.00–0.07)
Basophils Absolute: 0 K/uL (ref 0.0–0.1)
Basophils Relative: 0 %
Eosinophils Absolute: 0.2 K/uL (ref 0.0–0.5)
Eosinophils Relative: 1 %
HCT: 33.8 % — ABNORMAL LOW (ref 39.0–52.0)
Hemoglobin: 11.7 g/dL — ABNORMAL LOW (ref 13.0–17.0)
Immature Granulocytes: 1 %
Lymphocytes Relative: 1 %
Lymphs Abs: 0.2 K/uL — ABNORMAL LOW (ref 0.7–4.0)
MCH: 31.1 pg (ref 26.0–34.0)
MCHC: 34.6 g/dL (ref 30.0–36.0)
MCV: 89.9 fL (ref 80.0–100.0)
Monocytes Absolute: 0.5 K/uL (ref 0.1–1.0)
Monocytes Relative: 4 %
Neutro Abs: 12 K/uL — ABNORMAL HIGH (ref 1.7–7.7)
Neutrophils Relative %: 93 %
Platelets: 229 K/uL (ref 150–400)
RBC: 3.76 MIL/uL — ABNORMAL LOW (ref 4.22–5.81)
RDW: 14.7 % (ref 11.5–15.5)
WBC: 12.9 K/uL — ABNORMAL HIGH (ref 4.0–10.5)
nRBC: 0 % (ref 0.0–0.2)

## 2021-07-16 LAB — PHOSPHORUS: Phosphorus: 3.1 mg/dL (ref 2.5–4.6)

## 2021-07-16 LAB — COMPREHENSIVE METABOLIC PANEL
ALT: 18 U/L (ref 0–44)
AST: 21 U/L (ref 15–41)
Albumin: 2.9 g/dL — ABNORMAL LOW (ref 3.5–5.0)
Alkaline Phosphatase: 143 U/L — ABNORMAL HIGH (ref 38–126)
Anion gap: 11 (ref 5–15)
BUN: 19 mg/dL (ref 8–23)
CO2: 25 mmol/L (ref 22–32)
Calcium: 8.4 mg/dL — ABNORMAL LOW (ref 8.9–10.3)
Chloride: 95 mmol/L — ABNORMAL LOW (ref 98–111)
Creatinine, Ser: 0.52 mg/dL — ABNORMAL LOW (ref 0.61–1.24)
GFR, Estimated: >60 mL/min (ref >60)
Glucose, Bld: 212 mg/dL — ABNORMAL HIGH (ref 70–99)
Potassium: 4.4 mmol/L (ref 3.5–5.1)
Sodium: 131 mmol/L — ABNORMAL LOW (ref 135–145)
Total Bilirubin: 1.5 mg/dL — ABNORMAL HIGH (ref 0.3–1.2)
Total Protein: 5.9 g/dL — ABNORMAL LOW (ref 6.5–8.1)

## 2021-07-16 LAB — ECHOCARDIOGRAM COMPLETE
AR max vel: 2.4 cm2
AV Peak grad: 12.4 mmHg
Ao pk vel: 1.76 m/s
Area-P 1/2: 3.65 cm2
Calc EF: 63.4 %
Height: 70 in
S' Lateral: 3 cm
Single Plane A2C EF: 62.6 %
Single Plane A4C EF: 63.9 %
Weight: 2720 oz

## 2021-07-16 LAB — HEMOGLOBIN A1C
Hgb A1c MFr Bld: 7.4 % — ABNORMAL HIGH (ref 4.8–5.6)
Mean Plasma Glucose: 165.68 mg/dL

## 2021-07-16 LAB — HEPARIN LEVEL (UNFRACTIONATED)
Heparin Unfractionated: 0.11 IU/mL — ABNORMAL LOW (ref 0.30–0.70)
Heparin Unfractionated: 0.22 [IU]/mL — ABNORMAL LOW (ref 0.30–0.70)
Heparin Unfractionated: 0.25 IU/mL — ABNORMAL LOW (ref 0.30–0.70)

## 2021-07-16 LAB — STREP PNEUMONIAE URINARY ANTIGEN: Strep Pneumo Urinary Antigen: NEGATIVE

## 2021-07-16 LAB — RESPIRATORY PANEL BY PCR

## 2021-07-16 LAB — GLUCOSE, CAPILLARY
Glucose-Capillary: 342 mg/dL — ABNORMAL HIGH (ref 70–99)
Glucose-Capillary: 385 mg/dL — ABNORMAL HIGH (ref 70–99)
Glucose-Capillary: 386 mg/dL — ABNORMAL HIGH (ref 70–99)

## 2021-07-16 LAB — TSH
TSH: 3.026 u[IU]/mL (ref 0.350–4.500)
TSH: 1.816 u[IU]/mL (ref 0.350–4.500)

## 2021-07-16 LAB — APTT: aPTT: 33 s (ref 24–36)

## 2021-07-16 LAB — BLOOD GAS, VENOUS
Acid-Base Excess: 1.8 mmol/L (ref 0.0–2.0)
Bicarbonate: 25.3 mmol/L (ref 20.0–28.0)
O2 Saturation: 92.3 %
Patient temperature: 98.6
pCO2, Ven: 37.8 mmHg — ABNORMAL LOW (ref 44.0–60.0)
pH, Ven: 7.442 — ABNORMAL HIGH (ref 7.250–7.430)
pO2, Ven: 67.4 mmHg — ABNORMAL HIGH (ref 32.0–45.0)

## 2021-07-16 LAB — OSMOLALITY, URINE: Osmolality, Ur: 672 mOsm/kg (ref 300–900)

## 2021-07-16 LAB — CBG MONITORING, ED
Glucose-Capillary: 213 mg/dL — ABNORMAL HIGH (ref 70–99)
Glucose-Capillary: 260 mg/dL — ABNORMAL HIGH (ref 70–99)
Glucose-Capillary: 307 mg/dL — ABNORMAL HIGH (ref 70–99)

## 2021-07-16 LAB — TROPONIN I (HIGH SENSITIVITY)
Troponin I (High Sensitivity): 167 ng/L (ref <18)
Troponin I (High Sensitivity): 198 ng/L (ref <18)

## 2021-07-16 LAB — OSMOLALITY: Osmolality: 281 mOsm/kg (ref 275–295)

## 2021-07-16 LAB — MAGNESIUM: Magnesium: 1.8 mg/dL (ref 1.7–2.4)

## 2021-07-16 LAB — BRAIN NATRIURETIC PEPTIDE: B Natriuretic Peptide: 24.9 pg/mL (ref 0.0–100.0)

## 2021-07-16 MED ORDER — HYDROCODONE-ACETAMINOPHEN 5-325 MG PO TABS
1.0000 | ORAL_TABLET | ORAL | Status: DC | PRN
  Administered 2021-07-16 – 2021-07-19 (×2): 2 via ORAL
  Administered 2021-07-22: 1 via ORAL
  Administered 2021-07-24: 2 via ORAL
  Filled 2021-07-16 (×2): qty 2
  Filled 2021-07-16: qty 1

## 2021-07-16 MED ORDER — ARFORMOTEROL TARTRATE 15 MCG/2ML IN NEBU
15.0000 ug | INHALATION_SOLUTION | Freq: Two times a day (BID) | RESPIRATORY_TRACT | Status: DC
  Administered 2021-07-16 – 2021-07-23 (×16): 15 ug via RESPIRATORY_TRACT
  Filled 2021-07-16 (×18): qty 2

## 2021-07-16 MED ORDER — METHYLPREDNISOLONE SODIUM SUCC 125 MG IJ SOLR
80.0000 mg | Freq: Two times a day (BID) | INTRAMUSCULAR | Status: DC
Start: 2021-07-16 — End: 2021-07-20
  Administered 2021-07-16 – 2021-07-19 (×7): 80 mg via INTRAVENOUS
  Filled 2021-07-16 (×7): qty 2

## 2021-07-16 MED ORDER — HEPARIN (PORCINE) 25000 UT/250ML-% IV SOLN
1550.0000 [IU]/h | INTRAVENOUS | Status: AC
  Administered 2021-07-16: 21:00:00 1550 [IU]/h via INTRAVENOUS
  Administered 2021-07-17 – 2021-07-18 (×3): 1700 [IU]/h via INTRAVENOUS
  Administered 2021-07-19 – 2021-07-21 (×4): 1550 [IU]/h via INTRAVENOUS
  Filled 2021-07-16 (×8): qty 250

## 2021-07-16 MED ORDER — INSULIN GLARGINE-YFGN 100 UNIT/ML ~~LOC~~ SOLN
15.0000 [IU] | Freq: Every day | SUBCUTANEOUS | Status: DC
  Administered 2021-07-16 – 2021-07-17 (×2): 15 [IU] via SUBCUTANEOUS
  Filled 2021-07-16 (×2): qty 0.15

## 2021-07-16 MED ORDER — HEPARIN (PORCINE) 25000 UT/250ML-% IV SOLN
1300.0000 [IU]/h | INTRAVENOUS | Status: DC
  Administered 2021-07-16: 01:00:00 1300 [IU]/h via INTRAVENOUS
  Filled 2021-07-16: qty 250

## 2021-07-16 MED ORDER — INSULIN ASPART 100 UNIT/ML IJ SOLN
0.0000 [IU] | Freq: Every day | INTRAMUSCULAR | Status: DC
  Administered 2021-07-16: 23:00:00 5 [IU] via SUBCUTANEOUS
  Administered 2021-07-17 – 2021-07-19 (×2): 4 [IU] via SUBCUTANEOUS
  Administered 2021-07-20: 3 [IU] via SUBCUTANEOUS
  Administered 2021-07-21 – 2021-07-22 (×2): 5 [IU] via SUBCUTANEOUS
  Administered 2021-07-23: 3 [IU] via SUBCUTANEOUS
  Filled 2021-07-16: qty 0.05

## 2021-07-16 MED ORDER — SODIUM CHLORIDE 0.9 % IV SOLN: 1.0000 g | INTRAVENOUS | Status: DC

## 2021-07-16 MED ORDER — HEPARIN BOLUS VIA INFUSION
1000.0000 [IU] | Freq: Once | INTRAVENOUS | Status: AC
  Administered 2021-07-16: 12:00:00 1000 [IU] via INTRAVENOUS
  Filled 2021-07-16: qty 1000

## 2021-07-16 MED ORDER — UMECLIDINIUM BROMIDE 62.5 MCG/ACT IN AEPB
1.0000 | INHALATION_SPRAY | Freq: Every day | RESPIRATORY_TRACT | Status: DC
  Administered 2021-07-16 – 2021-07-23 (×8): 1 via RESPIRATORY_TRACT
  Filled 2021-07-16 (×2): qty 7

## 2021-07-16 MED ORDER — SODIUM CHLORIDE 0.9 % IV SOLN
2.0000 g | Freq: Three times a day (TID) | INTRAVENOUS | Status: AC
  Administered 2021-07-16 – 2021-07-22 (×21): 2 g via INTRAVENOUS
  Filled 2021-07-16 (×24): qty 2

## 2021-07-16 MED ORDER — ACETAMINOPHEN 325 MG PO TABS: 650.0000 mg | ORAL_TABLET | Freq: Four times a day (QID) | ORAL | Status: DC | PRN

## 2021-07-16 MED ORDER — BUDESONIDE 0.5 MG/2ML IN SUSP
0.5000 mg | Freq: Two times a day (BID) | RESPIRATORY_TRACT | Status: DC
  Administered 2021-07-16 – 2021-07-23 (×16): 0.5 mg via RESPIRATORY_TRACT
  Filled 2021-07-16 (×17): qty 2

## 2021-07-16 MED ORDER — VENLAFAXINE HCL ER 150 MG PO CP24
150.0000 mg | ORAL_CAPSULE | Freq: Every day | ORAL | Status: DC
  Administered 2021-07-16 – 2021-07-24 (×9): 150 mg via ORAL
  Filled 2021-07-16: qty 2
  Filled 2021-07-16 (×8): qty 1

## 2021-07-16 MED ORDER — METHYLPREDNISOLONE SODIUM SUCC 125 MG IJ SOLR
120.0000 mg | Freq: Two times a day (BID) | INTRAMUSCULAR | Status: DC
  Administered 2021-07-16: 09:00:00 120 mg via INTRAVENOUS
  Filled 2021-07-16: qty 2

## 2021-07-16 MED ORDER — TAMSULOSIN HCL 0.4 MG PO CAPS
0.8000 mg | ORAL_CAPSULE | Freq: Every day | ORAL | Status: DC
  Administered 2021-07-16 – 2021-07-24 (×9): 0.8 mg via ORAL
  Filled 2021-07-16 (×9): qty 2

## 2021-07-16 MED ORDER — INSULIN ASPART 100 UNIT/ML IJ SOLN
4.0000 [IU] | Freq: Three times a day (TID) | INTRAMUSCULAR | Status: DC
  Filled 2021-07-16: qty 0.04

## 2021-07-16 MED ORDER — ALBUTEROL SULFATE (2.5 MG/3ML) 0.083% IN NEBU: 2.5000 mg | INHALATION_SOLUTION | RESPIRATORY_TRACT | Status: DC | PRN

## 2021-07-16 MED ORDER — INSULIN ASPART 100 UNIT/ML IJ SOLN
0.0000 [IU] | Freq: Three times a day (TID) | INTRAMUSCULAR | Status: DC
Start: 2021-07-16 — End: 2021-07-16
  Filled 2021-07-16: qty 0.2

## 2021-07-16 MED ORDER — SODIUM CHLORIDE 0.9 % IV SOLN
75.0000 mL/h | INTRAVENOUS | Status: DC
  Administered 2021-07-16: 05:00:00 75 mL/h via INTRAVENOUS

## 2021-07-16 MED ORDER — VANCOMYCIN HCL 1750 MG/350ML IV SOLN
1750.0000 mg | INTRAVENOUS | Status: DC
  Administered 2021-07-16: 23:00:00 1750 mg via INTRAVENOUS
  Filled 2021-07-16 (×2): qty 350

## 2021-07-16 MED ORDER — LEVALBUTEROL HCL 0.63 MG/3ML IN NEBU: 0.6300 mg | INHALATION_SOLUTION | Freq: Four times a day (QID) | RESPIRATORY_TRACT | Status: DC | PRN

## 2021-07-16 MED ORDER — PANTOPRAZOLE SODIUM 40 MG PO TBEC
40.0000 mg | DELAYED_RELEASE_TABLET | Freq: Every day | ORAL | Status: DC
  Administered 2021-07-16 – 2021-07-24 (×9): 40 mg via ORAL
  Filled 2021-07-16 (×9): qty 1

## 2021-07-16 MED ORDER — SODIUM CHLORIDE 0.9 % IV SOLN
500.0000 mg | INTRAVENOUS | Status: DC
  Administered 2021-07-16 – 2021-07-19 (×4): 500 mg via INTRAVENOUS
  Filled 2021-07-16 (×4): qty 5

## 2021-07-16 MED ORDER — INSULIN ASPART 100 UNIT/ML IJ SOLN
0.0000 [IU] | Freq: Three times a day (TID) | INTRAMUSCULAR | Status: DC
  Administered 2021-07-16 (×2): 15 [IU] via SUBCUTANEOUS
  Administered 2021-07-17: 20 [IU] via SUBCUTANEOUS
  Administered 2021-07-17 (×2): 11 [IU] via SUBCUTANEOUS
  Administered 2021-07-18: 7 [IU] via SUBCUTANEOUS
  Administered 2021-07-18: 20 [IU] via SUBCUTANEOUS
  Administered 2021-07-18 – 2021-07-19 (×2): 15 [IU] via SUBCUTANEOUS
  Administered 2021-07-19: 7 [IU] via SUBCUTANEOUS
  Administered 2021-07-19: 4 [IU] via SUBCUTANEOUS
  Administered 2021-07-20 (×2): 15 [IU] via SUBCUTANEOUS
  Administered 2021-07-21 (×2): 11 [IU] via SUBCUTANEOUS
  Administered 2021-07-21: 3 [IU] via SUBCUTANEOUS
  Administered 2021-07-22 (×2): 11 [IU] via SUBCUTANEOUS
  Administered 2021-07-23: 15 [IU] via SUBCUTANEOUS
  Administered 2021-07-23: 11 [IU] via SUBCUTANEOUS
  Administered 2021-07-23: 4 [IU] via SUBCUTANEOUS
  Administered 2021-07-24: 15 [IU] via SUBCUTANEOUS
  Filled 2021-07-16: qty 0.2

## 2021-07-16 MED ORDER — IPRATROPIUM BROMIDE 0.02 % IN SOLN: 0.5000 mg | Freq: Four times a day (QID) | RESPIRATORY_TRACT | Status: DC | PRN

## 2021-07-16 MED ORDER — HEPARIN (PORCINE) 25000 UT/250ML-% IV SOLN
1400.0000 [IU]/h | INTRAVENOUS | Status: DC
  Administered 2021-07-16: 12:00:00 1400 [IU]/h via INTRAVENOUS

## 2021-07-16 MED ORDER — ORAL CARE MOUTH RINSE
15.0000 mL | Freq: Two times a day (BID) | OROMUCOSAL | Status: DC
  Administered 2021-07-16 – 2021-07-24 (×16): 15 mL via OROMUCOSAL

## 2021-07-16 MED ORDER — INSULIN ASPART 100 UNIT/ML IJ SOLN
4.0000 [IU] | Freq: Three times a day (TID) | INTRAMUSCULAR | Status: DC
  Administered 2021-07-16 (×2): 4 [IU] via SUBCUTANEOUS
  Filled 2021-07-16: qty 0.04

## 2021-07-16 MED ORDER — VANCOMYCIN HCL IN DEXTROSE 1-5 GM/200ML-% IV SOLN
1000.0000 mg | Freq: Once | INTRAVENOUS | Status: AC
  Administered 2021-07-16: 02:00:00 1000 mg via INTRAVENOUS
  Filled 2021-07-16: qty 200

## 2021-07-16 MED ORDER — HEPARIN BOLUS VIA INFUSION
1000.0000 [IU] | Freq: Once | INTRAVENOUS | Status: AC
  Administered 2021-07-16: 21:00:00 1000 [IU] via INTRAVENOUS
  Filled 2021-07-16: qty 1000

## 2021-07-16 MED ORDER — GUAIFENESIN ER 600 MG PO TB12
600.0000 mg | ORAL_TABLET | Freq: Two times a day (BID) | ORAL | Status: DC
  Administered 2021-07-16 – 2021-07-24 (×17): 600 mg via ORAL
  Filled 2021-07-16 (×17): qty 1

## 2021-07-16 MED ORDER — ACETAMINOPHEN 650 MG RE SUPP: 650.0000 mg | Freq: Four times a day (QID) | RECTAL | Status: DC | PRN

## 2021-07-16 MED ORDER — METHYLPREDNISOLONE SODIUM SUCC 125 MG IJ SOLR
125.0000 mg | Freq: Once | INTRAMUSCULAR | Status: AC
  Administered 2021-07-16: 01:00:00 125 mg via INTRAVENOUS
  Filled 2021-07-16: qty 2

## 2021-07-16 MED ORDER — ATORVASTATIN CALCIUM 10 MG PO TABS
10.0000 mg | ORAL_TABLET | Freq: Every day | ORAL | Status: DC
  Administered 2021-07-16 – 2021-07-24 (×9): 10 mg via ORAL
  Filled 2021-07-16 (×9): qty 1

## 2021-07-16 MED ORDER — INSULIN GLARGINE-YFGN 100 UNIT/ML ~~LOC~~ SOLN
15.0000 [IU] | Freq: Every day | SUBCUTANEOUS | Status: DC
  Filled 2021-07-16: qty 0.15

## 2021-07-16 NOTE — Progress Notes (Signed)
D/with Dr Cyndia Skeeters - PCCM consult not needed. Wil call if needed    SIGNATURE    Dr. Brand Males, M.D., F.C.C.P,  Pulmonary and Critical Care Medicine Staff Physician, [place] [facility] Director - Interstitial Lung Disease  Program  Pulmonary [place] [facility] [place], [place], [address]  NPI Number:  NPI #[id number]  Pager: [phone], If no answer  -> Check AMION or Try [phone] Telephone (clinical office): [phone] Telephone (research): [phone]  11:34 AM 07/16/2021

## 2021-07-16 NOTE — Progress Notes (Signed)
Pharmacy Antibiotic Note  Luis Brown is a 75 y.o. male admitted on 07/15/2021 with SOB and hypoxia, has hx significant of COPD, diabetes, HLD.  Pharmacy has been consulted to dose vancomycin and cefepime for pna.  Plan: Vancomycin 1gm IV x 1 then 1750mg  q24h (AUC 503.1, Scr 0.91) Cefepime 2gm IV q8h Follow renal function, cultures and clinical course  Height: 5\' 10"  (177.8 cm) Weight: 77.1 kg (170 lb) IBW/kg (Calculated) : 73  Temp (24hrs), Avg:97.7 F (36.5 C), Min:97.7 F (36.5 C), Max:97.7 F (36.5 C)  Recent Labs  Lab 07/15/21 1715 07/15/21 1836 07/15/21 2225  WBC 14.9*  --   --   CREATININE 0.91  --   --   LATICACIDVEN 1.9 2.3* 1.5    Estimated Creatinine Clearance: 70.2 mL/min (by C-G formula based on SCr of 0.91 mg/dL).    Allergies  Allergen Reactions   Daptomycin     Drug induced pneumonia     Antimicrobials this admission: 12/29 CTX x1 12/29 azith x 1 12/30 vanc >> 12/30 cefepime >>  Dose adjustments this admission:   Microbiology results: 12/29 BCx:  12/29 Resp PCR:  12/30 MRSA PCR:   Thank you for allowing pharmacy to be a part of this patients care.  Dolly Rias RPh 07/16/2021, 2:04 AM

## 2021-07-16 NOTE — Assessment & Plan Note (Signed)
Admit to stepdown Initiate heparin drip  Would likely benefit from case manager consult for long term anticoagulation Hold home blood pressure medications avoid hypotension Cycle cardiac enzymes Order echogram and lower extremity Dopplers  Most likely risk factors for hypercoagulable state  Unknown On xarelto and reports being compliant  Would benefit from hypercoagulable workup as an outpatient  discussed with PCCM

## 2021-07-16 NOTE — ED Notes (Signed)
Pt. CBG 213, RN, Maylon Cos made aware.

## 2021-07-16 NOTE — Progress Notes (Signed)
Inpatient Diabetes Program Recommendations  AACE/ADA: New Consensus Statement on Inpatient Glycemic Control (2015)  Target Ranges:  Prepandial:   less than 140 mg/dL      Peak postprandial:   less than 180 mg/dL (1-2 hours)      Critically ill patients:  140 - 180 mg/dL   Lab Results  Component Value Date   GLUCAP 260 (H) 07/16/2021   HGBA1C 7.4 (H) 07/15/2021    Review of Glycemic Control  Diabetes history: DM2 Outpatient Diabetes medications: Januvia 50 mg QD, metformin 1000 mg BID Current orders for Inpatient glycemic control: Novolog 0-9 units Q4H.  On Solumedrol 120 mg BID  HgbA1C - 7.4% - up from 6.9% CBGs 213, 260 mg/dL  Inpatient Diabetes Program Recommendations:    Add Semglee 8 units QD When diet starts, may need small amount of meal coverage insulin while on steroids.  Will follow glucose trends.  Thank you. Lorenda Peck, RD, LDN, CDE Inpatient Diabetes Coordinator [phone]

## 2021-07-16 NOTE — Progress Notes (Signed)
Bilateral lower extremity venous duplex has been completed. Preliminary results can be found in CV Proc through chart review.   07/16/21 8:52 AM Luis Brown RVT

## 2021-07-16 NOTE — Progress Notes (Signed)
PROGRESS NOTE  Luis Brown GLO:[id number] DOB: 24-Jan-1946   PCP: Mackie Pai, PA-C  Patient is from: Home.  Lives with his wife.  Independently ambulates at baseline.  DOA: 07/15/2021 LOS: 1  Chief complaints:  Chief Complaint  Patient presents with   Low O2 Sats     Brief Narrative / Interim history: 75 year old M with PMH of COPD not on oxygen, DM-2, daptomycin induced pneumonitis, A. fib/RUE DVT in 11/2020 on Xarelto, HLD, BPH, DDD, colonic polyps, anxiety and depression presenting with progressive shortness of breath for 1 week, hypoxemia, chest tightness and cough, and admitted for severe sepsis and acute hypoxic respiratory failure in the setting of CAP, left> right, and right-sided segmental and subsegmental PE with small to moderate blood clot burden.  He was hypoxic to 58% on RA while in ED.  Cultures obtained.  He was a started on vancomycin, cefepime, azithromycin, steroid and IV heparin.  PCCM consulted.   Subjective: Seen and examined earlier this morning.  No major events overnight of this morning.  Reports improvement in his breathing.  Still with somewhat productive cough with whitish phlegm.  He denies hemoptysis.  Reports good compliance with his Xarelto.  No history of malignancy, long car ride, recent surgery or significant immobility.  He denies chest pain, GI or UTI symptoms.  Objective: Vitals:   07/16/21 0345 07/16/21 0400 07/16/21 0415 07/16/21 0818  BP:   105/65 (!) 101/58  Pulse: 83  67 67  Resp: (!) 23  19 16   Temp:   98.3 F (36.8 C)   TempSrc:   Oral   SpO2: 98% 98% 97% 99%  Weight:      Height:        Examination:  GENERAL: No apparent distress.  Nontoxic. HEENT: MMM.  Vision and hearing grossly intact.  NECK: Supple.  No apparent JVD.  RESP: 93% on 4 L by [place].  No IWOB.  Crackles over LLL. CVS:  RRR. Heart sounds normal.  ABD/GI/GU: BS+. Abd soft, NTND.  MSK/EXT:  Moves extremities. No apparent deformity. No edema.  SKIN: no apparent  skin lesion or wound NEURO: Awake, alert and oriented appropriately.  No apparent focal neuro deficit. PSYCH: Calm. Normal affect.   Procedures:  None  Microbiology summarized: [id number], influenza PCR and full RVP panel negative. MRSA PCR pending. Blood cultures NGTD.  Assessment & Plan: Severe sepsis due to community-acquired pneumonia: POA.  Has leukocytosis, tachypnea, hypoxemia and lactic acidosis.  CXR and CTA showed bilateral pneumonia, left> right.  Culture data as above.  Recently started on cefdinir by his pulmonologist without improvement.  Pro-Cal slightly elevated to 0.21. -Continue vancomycin, cefepime and azithromycin -Can discontinue vancomycin if MRSA PCR screen negative. -Incentive spirometry/OOB -Mucolytic's and antitussive  Acute segmental and subsegmental right-sided acute pulmonary embolism-CTA with RUL and RLL PE History of RUE DVT in 11/2020 -Patient reports good compliance with Xarelto which makes him Xarelto failure. -Lower extremity US negative for DVT. -Follow echocardiogram -Continue IV heparin pending echocardiogram -Discussed with hematology, Dr. Lindi Adie who recommended Pradaxa and lupus anticoagulant panel  COPD exacerbation-it seems he was on prednisone taper since 07/08/2021 -Continue Solu-Medrol-decrease to 80 mg twice daily. -May need prolonged steroid taper on discharge. -Continue Brovana and Incruse -Add Pulmicort nebulizer -Xopenex and ipratropium every 6 hours as needed  Acute hypoxic respiratory failure due to pneumonia, PE and COPD.  Desaturated to 56% on RA while in ED.  He was on several liters this morning.  Weaned him to 4 L -Continue  weaning off oxygen -Manage pneumonia, PE and COPD as above. -Incentive spirometry, OOB  Paroxysmal A. fib: On metoprolol, digoxin and Xarelto.  Digoxin subtherapeutic at 0.4. -Continue holding metoprolol given soft blood pressure -Resume home digoxin -Anticoagulation as above   Uncontrolled NIDDM-2  with hyperglycemia: A1c 7.4%.  Hyperglycemia likely due to steroid Recent Labs  Lab 07/15/21 2334 07/16/21 0415 07/16/21 0802  GLUCAP 220* 213* 260*  -Increase SSI from sensitive to resistant scale -Add NovoLog 4 units 3 times daily with meals -Add basal insulin 15 units daily -Further adjustment as appropriate  Hyponatremia: Na 131, ajusts to 134 for hypoglycemia.  Urine sodium slightly elevated to 35.  TSH within normal.  Improving with IV fluid which argues against SIADH. -Continue IV NS  DDD/chronic back pain -Continue home Norco.  BPH without LUTS -Continue home Flomax.  Anxiety/depression: Stable. 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  No eosinophilia on CBC -Currently on a steroid for COPD exacerbation   Body mass index is 24.39 kg/m.         DVT prophylaxis:  heparin bolus via infusion 1,000 Units Start: 07/16/21 1100  Code Status: Full code Family Communication: Updated patient's wife at bedside. Level of care: Stepdown Status is: Inpatient  Remains inpatient appropriate because: Due to severe sepsis requiring IV antibiotics, hypoxemia requiring supplemental oxygen, COPD exacerbation requiring IV Solu-Medrol and PE requiring further evaluation    Final disposition: Likely home once medically stable.  Consultants:  PCCM on admission   Sch Meds:  Scheduled Meds:  arformoterol  15 mcg Nebulization BID   And   umeclidinium bromide  1 puff Inhalation Daily   atorvastatin  10 mg Oral Daily   guaiFENesin  600 mg Oral BID   heparin  1,000 Units Intravenous Once   insulin aspart  0-9 Units Subcutaneous Q4H   methylPREDNISolone (SOLU-MEDROL) injection  120 mg Intravenous Q12H   pantoprazole  40 mg Oral Daily   tamsulosin  0.8 mg Oral Daily   testosterone cypionate  200 mg Intramuscular Q14 Days   venlafaxine XR  150 mg Oral Q breakfast   Continuous Infusions:  sodium chloride 75 mL/hr (07/16/21 0442)    azithromycin     ceFEPime (MAXIPIME) IV 2 g (07/16/21 0220)   heparin     vancomycin     PRN Meds:.acetaminophen **OR** acetaminophen, albuterol, HYDROcodone-acetaminophen, melatonin, oxyCODONE-acetaminophen  Antimicrobials: Anti-infectives (From admission, onward)    Start     Dose/Rate Route Frequency Ordered Stop   07/16/21 2200  vancomycin (VANCOREADY) IVPB 1750 mg/350 mL        1,750 mg 175 mL/hr over 120 Minutes Intravenous Every 24 hours 07/16/21 0242     07/16/21 1930  cefTRIAXone (ROCEPHIN) 1 g in sodium chloride 0.9 % 100 mL IVPB  Status:  Discontinued        1 g 200 mL/hr over 30 Minutes Intravenous Every 24 hours 07/16/21 0436 07/16/21 0438   07/16/21 1900  azithromycin (ZITHROMAX) 500 mg in sodium chloride 0.9 % 250 mL IVPB        500 mg 250 mL/hr over 60 Minutes Intravenous Every 24 hours 07/16/21 0436     07/16/21 0400  ceFEPIme (MAXIPIME) 2 g in sodium chloride 0.9 % 100 mL IVPB        2 g 200 mL/hr over 30 Minutes Intravenous Every 8 hours 07/16/21 0200     07/16/21 0215  vancomycin (VANCOCIN) IVPB 1000 mg/200 mL premix  1,000 mg 200 mL/hr over 60 Minutes Intravenous  Once 07/16/21 0200 07/16/21 0441   07/15/21 1915  cefTRIAXone (ROCEPHIN) 1 g in sodium chloride 0.9 % 100 mL IVPB        1 g 200 mL/hr over 30 Minutes Intravenous  Once 07/15/21 1912 07/15/21 2140   07/15/21 1915  azithromycin (ZITHROMAX) 500 mg in sodium chloride 0.9 % 250 mL IVPB        500 mg 250 mL/hr over 60 Minutes Intravenous  Once 07/15/21 1912 07/15/21 2139        I have personally reviewed the following labs and images: CBC: Recent Labs  Lab 07/15/21 1715 07/16/21 0449  WBC 14.9* 12.9*  NEUTROABS 13.2* 12.0*  HGB 12.3* 11.7*  HCT 36.2* 33.8*  MCV 89.6 89.9  PLT 338 229   BMP &GFR Recent Labs  Lab 07/15/21 1715 07/16/21 0449  NA 127* 131*  K 4.2 4.4  CL 93* 95*  CO2 23 25  GLUCOSE 482* 212*  BUN 25* 19  CREATININE 0.91 0.52*  CALCIUM 8.7* 8.4*  MG  --  1.8   PHOS  --  3.1   Estimated Creatinine Clearance: 79.8 mL/min (A) (by C-G formula based on SCr of 0.52 mg/dL (L)). Liver & Pancreas: Recent Labs  Lab 07/15/21 1715 07/16/21 0449  AST 22 21  ALT 17 18  ALKPHOS 157* 143*  BILITOT 1.3* 1.5*  PROT 6.4* 5.9*  ALBUMIN 3.4* 2.9*   No results for input(s): LIPASE, AMYLASE in the last 168 hours. No results for input(s): AMMONIA in the last 168 hours. Diabetic: Recent Labs    07/15/21 2225  HGBA1C 7.4*   Recent Labs  Lab 07/15/21 2334 07/16/21 0415 07/16/21 0802  GLUCAP 220* 213* 260*   Cardiac Enzymes: No results for input(s): CKTOTAL, CKMB, CKMBINDEX, TROPONINI in the last 168 hours. No results for input(s): PROBNP in the last 8760 hours. Coagulation Profile: Recent Labs  Lab 07/15/21 1715  INR 1.3*   Thyroid Function Tests: Recent Labs    07/16/21 1000  TSH 1.816   Lipid Profile: No results for input(s): CHOL, HDL, LDLCALC, TRIG, CHOLHDL, LDLDIRECT in the last 72 hours. Anemia Panel: No results for input(s): VITAMINB12, FOLATE, FERRITIN, TIBC, IRON, RETICCTPCT in the last 72 hours. Urine analysis:    Component Value Date/Time   COLORURINE YELLOW 07/15/2021 1636   APPEARANCEUR CLEAR 07/15/2021 1636   LABSPEC 1.024 07/15/2021 1636   PHURINE 6.0 07/15/2021 1636   GLUCOSEU >=500 (A) 07/15/2021 1636   HGBUR SMALL (A) 07/15/2021 1636   BILIRUBINUR NEGATIVE 07/15/2021 1636   BILIRUBINUR negative 10/08/2019 1430   KETONESUR NEGATIVE 07/15/2021 1636   PROTEINUR NEGATIVE 07/15/2021 1636   UROBILINOGEN negative (A) 10/08/2019 1430   UROBILINOGEN 0.2 04/08/2011 0956   NITRITE NEGATIVE 07/15/2021 1636   LEUKOCYTESUR NEGATIVE 07/15/2021 1636   Sepsis Labs: Invalid input(s): PROCALCITONIN, [place]  Microbiology: Recent Results (from the past 240 hour(s))  Blood Culture (routine x 2)     Status: None (Preliminary result)   Collection Time: 07/15/21  5:01 PM   Specimen: BLOOD  Result Value Ref Range Status    Specimen Description   Final    BLOOD RIGHT ANTECUBITAL Performed at [facility] [address], [place], [place] [address]    Special Requests   Final    BOTTLES DRAWN AEROBIC AND ANAEROBIC Blood Culture adequate volume Performed at [facility] [address], [place], [place] [address]    Culture   Final  NO GROWTH < 24 HOURS Performed at [facility] [address], [place], [place] [address]    Report Status PENDING  Incomplete  Resp Panel by RT-PCR (Flu A&B, Covid) Nasopharyngeal Swab     Status: None   Collection Time: 07/15/21  5:17 PM   Specimen: Nasopharyngeal Swab; Nasopharyngeal(NP) swabs in vial transport medium  Result Value Ref Range Status   SARS Coronavirus 2 by RT PCR NEGATIVE NEGATIVE Final    Comment: (NOTE) SARS-CoV-2 target nucleic acids are NOT DETECTED.  The SARS-CoV-2 RNA is generally detectable in upper respiratory specimens during the acute phase of infection. The lowest concentration of SARS-CoV-2 viral copies this assay can detect is 138 copies/mL. A negative result does not preclude SARS-Cov-2 infection and should not be used as the sole basis for treatment or other patient management decisions. A negative result may occur with  improper specimen collection/handling, submission of specimen other than nasopharyngeal swab, presence of viral mutation(s) within the areas targeted by this assay, and inadequate number of viral copies(<138 copies/mL). A negative result must be combined with clinical observations, patient history, and epidemiological information. The expected result is Negative.  Fact Sheet for Patients:  EntrepreneurPulse.com.au  Fact Sheet for Healthcare Providers:  IncredibleEmployment.be  This test is no t yet approved or cleared by the [place] FDA and  has been authorized for detection and/or diagnosis of SARS-CoV-2 by FDA under an Emergency  Use Authorization (EUA). This EUA will remain  in effect (meaning this test can be used) for the duration of the COVID-19 declaration under Section 564(b)(1) of the Act, 21 U.S.C.section 360bbb-3(b)(1), unless the authorization is terminated  or revoked sooner.       Influenza A by PCR NEGATIVE NEGATIVE Final   Influenza B by PCR NEGATIVE NEGATIVE Final    Comment: (NOTE) The Xpert Xpress SARS-CoV-2/FLU/RSV plus assay is intended as an aid in the diagnosis of influenza from Nasopharyngeal swab specimens and should not be used as a sole basis for treatment. Nasal washings and aspirates are unacceptable for Xpert Xpress SARS-CoV-2/FLU/RSV testing.  Fact Sheet for Patients: EntrepreneurPulse.com.au  Fact Sheet for Healthcare Providers: IncredibleEmployment.be  This test is not yet approved or cleared by the [place] FDA and has been authorized for detection and/or diagnosis of SARS-CoV-2 by FDA under an Emergency Use Authorization (EUA). This EUA will remain in effect (meaning this test can be used) for the duration of the COVID-19 declaration under Section 564(b)(1) of the Act, 21 U.S.C. section 360bbb-3(b)(1), unless the authorization is terminated or revoked.  Performed at [facility], [place] [address] [place], [place] [address]   Blood Culture (routine x 2)     Status: None (Preliminary result)   Collection Time: 07/15/21  5:19 PM   Specimen: BLOOD  Result Value Ref Range Status   Specimen Description   Final    BLOOD RIGHT WRIST Performed at [facility] [address], [place], [place] [address]    Special Requests   Final    BOTTLES DRAWN AEROBIC AND ANAEROBIC Blood Culture adequate volume Performed at [facility] [address] [place], [place] [address]    Culture   Final    NO GROWTH < 24 HOURS Performed at [facility] [address], [place], [place]  [address]    Report Status PENDING  Incomplete  Respiratory (~20 pathogens) panel by PCR     Status: None   Collection Time: 07/15/21 10:25 PM  Specimen: Nasopharyngeal Swab; Respiratory  Result Value Ref Range Status   Adenovirus NOT DETECTED NOT DETECTED Final   Coronavirus 229E NOT DETECTED NOT DETECTED Final    Comment: (NOTE) The Coronavirus on the Respiratory Panel, DOES NOT test for the novel  Coronavirus (2019 nCoV)    Coronavirus HKU1 NOT DETECTED NOT DETECTED Final   Coronavirus NL63 NOT DETECTED NOT DETECTED Final   Coronavirus OC43 NOT DETECTED NOT DETECTED Final   Metapneumovirus NOT DETECTED NOT DETECTED Final   Rhinovirus / Enterovirus NOT DETECTED NOT DETECTED Final   Influenza A NOT DETECTED NOT DETECTED Final   Influenza B NOT DETECTED NOT DETECTED Final   Parainfluenza Virus 1 NOT DETECTED NOT DETECTED Final   Parainfluenza Virus 2 NOT DETECTED NOT DETECTED Final   Parainfluenza Virus 3 NOT DETECTED NOT DETECTED Final   Parainfluenza Virus 4 NOT DETECTED NOT DETECTED Final   Respiratory Syncytial Virus NOT DETECTED NOT DETECTED Final   Bordetella pertussis NOT DETECTED NOT DETECTED Final   Bordetella Parapertussis NOT DETECTED NOT DETECTED Final   Chlamydophila pneumoniae NOT DETECTED NOT DETECTED Final   Mycoplasma pneumoniae NOT DETECTED NOT DETECTED Final    Comment: Performed at [facility], [place]. [address], [place], [place] [address]    Radiology Studies: DG Chest 2 View  Result Date: 07/15/2021 CLINICAL DATA:  Short of breath.  Hypoxemia. EXAM: CHEST - 2 VIEW COMPARISON:  02/09/2021 FINDINGS: Extensive asymmetric airspace disease throughout the left upper lobe and left lower lobe. Small amount of airspace disease in the right lateral lung base. No pleural effusion. Heart size normal.  Vascularity normal Extensive thoracic and lumbar fusion hardware. IMPRESSION: Extensive asymmetric airspace disease on the left and a small amount of airspace disease in  the right. Probable pneumonia Electronically Signed   By: Franchot Gallo M.D.   On: 07/15/2021 18:26   CT Angio Chest Pulmonary Embolism (PE) W or WO Contrast  Result Date: 07/16/2021 CLINICAL DATA:  Pulmonary embolism suspected.  High probability. EXAM: CT ANGIOGRAPHY CHEST WITH CONTRAST TECHNIQUE: Multidetector CT imaging of the chest was performed using the standard protocol during bolus administration of intravenous contrast. Multiplanar CT image reconstructions and MIPs were obtained to evaluate the vascular anatomy. CONTRAST:  53mL OMNIPAQUE IOHEXOL 350 MG/ML SOLN COMPARISON:  PA and lateral chest today, chest film 02/09/2021, chest CT without contrast 12/11/2020 and CTA chest 11/10/2020. FINDINGS: Cardiovascular: There is mild cardiomegaly with right chamber predominance, RV/LV ratio 1.2 indicating likely right heart strain and prominence of the pulmonary trunk to 3.3 cm. There are segmental and subsegmental occlusive emboli in the right lower lobe posterior base and in the right upper lobe apical and posterior segments. There may questionably be small-vessel emboli in the right middle lobe periphery but this is difficult to assess due to breathing motion. There is no pericardial effusion. There are scattered calcifications in the LAD and circumflex coronary arteries thoracic aorta. There is no aortic dissection or aneurysm. The great vessels opacify normally. Mediastinum/Nodes: There are mildly enlarged left-sided pre-vascular lymph nodes compared to the last 2 studies, for example a lymph node measuring 1.1 cm in short axis on series 4 axial 57, prominent subcarinal nodal complex now measuring 1.5 cm in short axis on axial 74, and decreased prominence of right hilar lymph nodes, largest of these 1.4 cm in short axis on axial 67. No thyroid or esophageal mass is seen. Lungs/Pleura: Extensive interstitial and ground-glass consolidation in the left lung is noted with denser patchy consolidation in  the  upper half of the left upper lobe. Background mild centrilobular and paraseptal emphysematous change in the upper lobes. There are much less dense faint patchy ground-glass opacities in the right lower lobe probably post pneumonic scarring and similar to the last CT, with new areas of denser patchy ground-glass infiltrate in the anterior segment of the right upper lobe. There are increased small cystic spaces in the consolidation primarily within the left upper lobe and superior segment left lower lobe concerning for a necrotizing pneumonic process. No pulmonary abscess is seen. There are only trace pleural effusions and no loculated collection. Relatively small caliber in the central airways may suggest reactive airways disease or bronchospasm and there is mild bronchial thickening in both lower lobes. Upper Abdomen: No acute abnormality. Musculoskeletal: Additional comparison is made to a thoracic spine CT of 03/26/2021. Extensive posterior spinal fusion hardware is again noted, evidence of previously removed pedicle screws at T5, T6 and T10 with posterior rods and pedicle screws again beginning at the level of T7. 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 Evidence of prior spondylodiscitis and vertebral body ankylosis again noted T4-5 and T9-10. No new endplate erosive disease is seen. Bilateral paravertebral soft tissue prominence along the mid to lower thoracic spine is similar. Previously described as likely phlegmon. There are no paraspinal fluid collections. Review of the MIP images confirms the above findings. IMPRESSION: 1. Segmental and subsegmental arterial emboli in the right upper and lower lobes. The overall clot burden appears small to moderate. There may be additional small vessel emboli in the right middle lobe, difficult to confirm given respiratory  motion. 2. Cardiomegaly with a right chamber predominance indicating right heart strain with prominent pulmonary trunk and no further visible emboli. 3. Interstitial and extensive ground-glass and patchy consolidative change in the left lung with scattered ground-glass infiltrates in the anterior segment of the right upper lobe. Background COPD. Probable multi lobar pneumonia, previously seen in different locations with similar appearance. 4. Increased small cystic spaces in the left lung consolidation concerning for necrotizing pneumonic component. Follow-up study recommended after treatment. 5. Increased mildly enlarged mediastinal and right hilar lymph nodes likely reactive. 6. Aortic and coronary artery atherosclerosis. 7. 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 Paraspinal soft tissue fullness appear similar. Electronically Signed   By: Telford Nab M.D.   On: 07/16/2021 00:16   VAS [place] LOWER EXTREMITY VENOUS (DVT)  Result Date: 07/16/2021  Lower Venous DVT Study Patient Name:  Luis Brown  Date of Exam:   07/16/2021 Medical Rec #: [id number]         Accession #:    [id number] Date of Birth: Oct 10, 1945          Patient Gender: M Patient Age:   56 years Exam Location:  [facility] Procedure:      VAS [place] LOWER EXTREMITY VENOUS (DVT) Referring Phys: Nyoka Lint DOUTOVA --------------------------------------------------------------------------------  Indications: Pulmonary embolism.  Risk Factors: Confirmed PE. Anticoagulation: Heparin. Comparison Study: No prior studies. Performing Technologist: Oliver Hum RVT  Examination Guidelines: A complete evaluation includes B-mode imaging, spectral Doppler, color Doppler, and power Doppler as needed of all accessible portions of each vessel. Bilateral testing is considered an integral part of a  complete examination. Limited examinations for reoccurring indications may  be performed as noted. The reflux portion of the exam is performed with the patient in reverse Trendelenburg.  +---------+---------------+---------+-----------+----------+--------------+  RIGHT     Compressibility Phasicity Spontaneity Properties Thrombus Aging  +---------+---------------+---------+-----------+----------+--------------+  CFV       Full            Yes       Yes                                    +---------+---------------+---------+-----------+----------+--------------+  SFJ       Full                                                             +---------+---------------+---------+-----------+----------+--------------+  FV Prox   Full                                                             +---------+---------------+---------+-----------+----------+--------------+  FV Mid    Full                                                             +---------+---------------+---------+-----------+----------+--------------+  FV Distal Full                                                             +---------+---------------+---------+-----------+----------+--------------+  PFV       Full                                                             +---------+---------------+---------+-----------+----------+--------------+  POP       Full            Yes       Yes                                    +---------+---------------+---------+-----------+----------+--------------+  PTV       Full                                                             +---------+---------------+---------+-----------+----------+--------------+  PERO      Full                                                             +---------+---------------+---------+-----------+----------+--------------+   +---------+---------------+---------+-----------+----------+--------------+  LEFT      Compressibility Phasicity Spontaneity Properties Thrombus Aging   +---------+---------------+---------+-----------+----------+--------------+  CFV       Full            Yes       Yes                                    +---------+---------------+---------+-----------+----------+--------------+  SFJ       Full                                                             +---------+---------------+---------+-----------+----------+--------------+  FV Prox   Full                                                             +---------+---------------+---------+-----------+----------+--------------+  FV Mid    Full                                                             +---------+---------------+---------+-----------+----------+--------------+  FV Distal Full                                                             +---------+---------------+---------+-----------+----------+--------------+  PFV       Full                                                             +---------+---------------+---------+-----------+----------+--------------+  POP       Full            Yes       Yes                                    +---------+---------------+---------+-----------+----------+--------------+  PTV       Full                                                             +---------+---------------+---------+-----------+----------+--------------+  PERO      Full                                                             +---------+---------------+---------+-----------+----------+--------------+  Summary: RIGHT: - There is no evidence of deep vein thrombosis in the lower extremity.  - No cystic structure found in the popliteal fossa.  LEFT: - There is no evidence of deep vein thrombosis in the lower extremity.  - No cystic structure found in the popliteal fossa.  *See table(s) above for measurements and observations.    Preliminary      CRITICAL CARE Performed by: Mercy Riding   Total critical care time: 65 minutes  Critical care time was exclusive of separately billable  procedures and treating other patients.  Critical care was necessary to treat or prevent imminent or life-threatening deterioration.  Critical care was time spent personally by me on the following activities: development of treatment plan with patient and/or surrogate as well as nursing, discussions with consultants, evaluation of patient's response to treatment, examination of patient, obtaining history from patient or surrogate, ordering and performing treatments and interventions, ordering and review of laboratory studies, ordering and review of radiographic studies, pulse oximetry and re-evaluation of patient's condition.    Caryl Manas T. [place]  If 7PM-7AM, please contact night-coverage [email or web address] 07/16/2021, 11:38 AM

## 2021-07-16 NOTE — Assessment & Plan Note (Signed)
Discussed with PCCM will start on high-dose Solu-Medrol Then 60mg   every 6 hours Pulmonary is aware will continue to see patient in consult

## 2021-07-16 NOTE — Progress Notes (Signed)
ANTICOAGULATION CONSULT NOTE - Initial Consult  Pharmacy Consult for heparin Indication: pulmonary embolus  Allergies  Allergen Reactions   Daptomycin     Drug induced pneumonia     Patient Measurements: Height: 5\' 10"  (177.8 cm) Weight: 77.1 kg (170 lb) IBW/kg (Calculated) : 73 Heparin Dosing Weight: 77.1kg  Vital Signs: Temp: 97.7 F (36.5 C) (12/29 1623) Temp Source: Oral (12/29 1623) BP: 104/52 (12/29 2315) Pulse Rate: 72 (12/29 2315)  Labs: Recent Labs    07/15/21 1715 07/15/21 2225  HGB 12.3*  --   HCT 36.2*  --   PLT 338  --   APTT 28  --   LABPROT 16.3*  --   INR 1.3*  --   CREATININE 0.91  --   TROPONINIHS  --  11    Estimated Creatinine Clearance: 70.2 mL/min (by C-G formula based on SCr of 0.91 mg/dL).   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   Esophageal reflux 02/10/2015   Fatigue    HTN (hypertension)    Hyperglycemia    preDM    Hyperlipidemia    Insomnia    Low back pain radiating to both legs 01/16/2017   Measles as a child   Overweight(278.02)    Peripheral neuropathy 08/07/2019   Personal history of colonic polyps 10/27/2012   Follows with [facility]   Pneumonia    " walking"   Preventative health care    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   Wears glasses     Medications:  Xarelto 20mg  daily  Assessment: 75 yo male presents with SOB and cough, has had for the last 3 weeks but worse the last couple days.  History of Afib and DVT/PE, on xarelto.  Pharmacy  consulted to dose heparin drip for PE.  LD of xarelto 12/28  Hgb 12.3, Plts 338, aPTT 28, INR 1.3 Baseline heparin level ordered STAT   Goal of Therapy:  Heparin level 0.3-0.7 units/ml aPTT 66-102 seconds Monitor platelets by anticoagulation protocol: Yes   Plan:  No bolus, start heparin drip at 1300 units/hr aPTT/HL in 8 hours Daily CBC  Dolly Rias RPh 07/16/2021, 1:04 AM

## 2021-07-16 NOTE — Progress Notes (Signed)
ANTICOAGULATION CONSULT NOTE - Follow Up Consult  Pharmacy Consult for Heparin Indication: pulmonary embolus  Allergies  Allergen Reactions   Daptomycin     Drug induced pneumonia     Patient Measurements: Height: 5\' 10"  (177.8 cm) Weight: 77.1 kg (170 lb) IBW/kg (Calculated) : 73 Heparin Dosing Weight: 77 kg  Vital Signs: Temp: 97.7 F (36.5 C) (12/30 1700) Temp Source: Oral (12/30 1700) BP: 132/69 (12/30 1700) Pulse Rate: 83 (12/30 1700)  Labs: Recent Labs    07/15/21 1715 07/15/21 2225 07/16/21 0118 07/16/21 0449 07/16/21 1000 07/16/21 1842  HGB 12.3*  --   --  11.7*  --   --   HCT 36.2*  --   --  33.8*  --   --   PLT 338  --   --  229  --   --   APTT 28  --   --   --  33  --   LABPROT 16.3*  --   --   --   --   --   INR 1.3*  --   --   --   --   --   HEPARINUNFRC  --   --  0.11*  --  0.22* 0.25*  CREATININE 0.91  --   --  0.52*  --   --   TROPONINIHS  --  11 167* 198*  --   --      Estimated Creatinine Clearance: 79.8 mL/min (A) (by C-G formula based on SCr of 0.52 mg/dL (L)).   Medications:  Scheduled:   arformoterol  15 mcg Nebulization BID   And   umeclidinium bromide  1 puff Inhalation Daily   atorvastatin  10 mg Oral Daily   budesonide (PULMICORT) nebulizer solution  0.5 mg Nebulization BID   guaiFENesin  600 mg Oral BID   insulin aspart  0-20 Units Subcutaneous TID WC   insulin aspart  0-5 Units Subcutaneous QHS   insulin aspart  4 Units Subcutaneous TID WC   insulin glargine-yfgn  15 Units Subcutaneous Daily   mouth rinse  15 mL Mouth Rinse BID   methylPREDNISolone (SOLU-MEDROL) injection  80 mg Intravenous Q12H   pantoprazole  40 mg Oral Daily   tamsulosin  0.8 mg Oral Daily   venlafaxine XR  150 mg Oral Q breakfast   Infusions:   azithromycin     ceFEPime (MAXIPIME) IV Stopped (07/16/21 1301)   heparin 1,400 Units/hr (07/16/21 1819)   vancomycin      Assessment: 75 yo male presents with SOB and cough, has had for the last 3 weeks  but worse the last couple days.  History of Afib and DVT/PE, on xarelto.  Pharmacy consulted to dose heparin drip for PE.   Xarelto last dose reported on 12/28, but baseline HL 0.11 (12/30), and not elevated at baseline as would be expected on Xarelto.  OK to dose using heparin level.   This evening, 07/16/2021: Heparin level 0.25, subtherapeutic on heparin 1400 units/hr  No bleeding or complications reported by RN.    Goal of Therapy:  Heparin level 0.3-0.7 units/ml Monitor platelets by anticoagulation protocol: Yes   Plan:  Give heparin 1000 units bolus IV x 1 Increase to heparin IV infusion at 1550 units/hr Heparin level 8 hours after rate change Daily heparin level and CBC Continue to monitor H&H and platelets   Royetta Asal, PharmD, [place] Please utilize Amion for appropriate phone number to reach the unit pharmacist [facility]  Pharmacy) 07/16/2021 8:02 PM

## 2021-07-16 NOTE — Progress Notes (Signed)
ANTICOAGULATION CONSULT NOTE - Follow Up Consult  Pharmacy Consult for Heparin Indication: pulmonary embolus  Allergies  Allergen Reactions   Daptomycin     Drug induced pneumonia     Patient Measurements: Height: 5\' 10"  (177.8 cm) Weight: 77.1 kg (170 lb) IBW/kg (Calculated) : 73 Heparin Dosing Weight: 77 kg  Vital Signs: Temp: 98.3 F (36.8 C) (12/30 0415) Temp Source: Oral (12/30 0415) BP: 101/58 (12/30 0818) Pulse Rate: 67 (12/30 0818)  Labs: Recent Labs    07/15/21 1715 07/15/21 2225 07/16/21 0118 07/16/21 0449  HGB 12.3*  --   --  11.7*  HCT 36.2*  --   --  33.8*  PLT 338  --   --  229  APTT 28  --   --   --   LABPROT 16.3*  --   --   --   INR 1.3*  --   --   --   HEPARINUNFRC  --   --  0.11*  --   CREATININE 0.91  --   --  0.52*  TROPONINIHS  --  11 167* 198*    Estimated Creatinine Clearance: 79.8 mL/min (A) (by C-G formula based on SCr of 0.52 mg/dL (L)).   Medications:  Scheduled:   arformoterol  15 mcg Nebulization BID   And   umeclidinium bromide  1 puff Inhalation Daily   atorvastatin  10 mg Oral Daily   guaiFENesin  600 mg Oral BID   insulin aspart  0-9 Units Subcutaneous Q4H   methylPREDNISolone (SOLU-MEDROL) injection  120 mg Intravenous Q12H   pantoprazole  40 mg Oral Daily   tamsulosin  0.8 mg Oral Daily   testosterone cypionate  200 mg Intramuscular Q14 Days   venlafaxine XR  150 mg Oral Q breakfast   Infusions:   sodium chloride 75 mL/hr (07/16/21 0442)   azithromycin     ceFEPime (MAXIPIME) IV 2 g (07/16/21 0220)   heparin 1,300 Units/hr (07/16/21 0118)   vancomycin      Assessment: 75 yo male presents with SOB and cough, has had for the last 3 weeks but worse the last couple days.  History of Afib and DVT/PE, on xarelto.  Pharmacy consulted to dose heparin drip for PE.   Xarelto last dose reported on 12/28, but baseline HL 0.11 (12/30), and not elevated at baseline as would be expected on Xarelto.  OK to dose using heparin  level.   Today, 07/16/2021: Heparin level 0.22, subtherapeutic on heparin 1300 units/hr - RN reports some IV pauses and IV catheter was a little bent, but this has since been fixed.   CBC:  Hgb decreased to 11.7, Plt WNL No bleeding or complications reported by RN.   SCr  low, Tbili elevated  Goal of Therapy:  Heparin level 0.3-0.7 units/ml Monitor platelets by anticoagulation protocol: Yes   Plan:  Give heparin 1000 units bolus IV x 1 Increase to heparin IV infusion at 1400 units/hr Heparin level 8 hours after rate change Daily heparin level and CBC Continue to monitor H&H and platelets   Gretta Arab PharmD, BCPS Clinical Pharmacist [facility] main pharmacy [phone] 07/16/2021 10:11 AM

## 2021-07-17 ENCOUNTER — Inpatient Hospital Stay (HOSPITAL_COMMUNITY): Payer: Medicare Other

## 2021-07-17 DIAGNOSIS — E1165 Type 2 diabetes mellitus with hyperglycemia: Secondary | ICD-10-CM

## 2021-07-17 DIAGNOSIS — R778 Other specified abnormalities of plasma proteins: Secondary | ICD-10-CM

## 2021-07-17 DIAGNOSIS — J9601 Acute respiratory failure with hypoxia: Secondary | ICD-10-CM

## 2021-07-17 DIAGNOSIS — I2609 Other pulmonary embolism with acute cor pulmonale: Secondary | ICD-10-CM

## 2021-07-17 LAB — BLOOD GAS, ARTERIAL
Acid-base deficit: 0.9 mmol/L (ref 0.0–2.0)
Bicarbonate: 22.4 mmol/L (ref 20.0–28.0)
O2 Saturation: 92 %
Patient temperature: 98.1
pCO2 arterial: 33.8 mmHg (ref 32.0–48.0)
pH, Arterial: 7.436 (ref 7.350–7.450)
pO2, Arterial: 66.9 mmHg — ABNORMAL LOW (ref 83.0–108.0)

## 2021-07-17 LAB — GLUCOSE, CAPILLARY
Glucose-Capillary: 252 mg/dL — ABNORMAL HIGH (ref 70–99)
Glucose-Capillary: 287 mg/dL — ABNORMAL HIGH (ref 70–99)
Glucose-Capillary: 261 mg/dL — ABNORMAL HIGH (ref 70–99)
Glucose-Capillary: 405 mg/dL — ABNORMAL HIGH (ref 70–99)
Glucose-Capillary: 346 mg/dL — ABNORMAL HIGH (ref 70–99)

## 2021-07-17 LAB — RAPID URINE DRUG SCREEN, HOSP PERFORMED
Amphetamines: NOT DETECTED
Barbiturates: NOT DETECTED
Benzodiazepines: NOT DETECTED
Cocaine: NOT DETECTED
Opiates: NOT DETECTED
Tetrahydrocannabinol: NOT DETECTED

## 2021-07-17 LAB — SEDIMENTATION RATE: Sed Rate: 30 mm/hr — ABNORMAL HIGH (ref 0–16)

## 2021-07-17 LAB — CK TOTAL AND CKMB (NOT AT ARMC)
CK, MB: 3.1 ng/mL (ref 0.5–5.0)
Relative Index: INVALID (ref 0.0–2.5)
Total CK: 87 U/L (ref 49–397)

## 2021-07-17 LAB — HEPARIN LEVEL (UNFRACTIONATED)
Heparin Unfractionated: 0.26 IU/mL — ABNORMAL LOW (ref 0.30–0.70)
Heparin Unfractionated: 0.46 IU/mL (ref 0.30–0.70)

## 2021-07-17 LAB — RENAL FUNCTION PANEL
Albumin: 2.8 g/dL — ABNORMAL LOW (ref 3.5–5.0)
Anion gap: 12 (ref 5–15)
BUN: 27 mg/dL — ABNORMAL HIGH (ref 8–23)
CO2: 23 mmol/L (ref 22–32)
Calcium: 8.5 mg/dL — ABNORMAL LOW (ref 8.9–10.3)
Chloride: 100 mmol/L (ref 98–111)
Creatinine, Ser: 0.68 mg/dL (ref 0.61–1.24)
GFR, Estimated: >60 mL/min (ref >60)
Glucose, Bld: 286 mg/dL — ABNORMAL HIGH (ref 70–99)
Phosphorus: 3.1 mg/dL (ref 2.5–4.6)
Potassium: 4.4 mmol/L (ref 3.5–5.1)
Sodium: 135 mmol/L (ref 135–145)

## 2021-07-17 LAB — MRSA NEXT GEN BY PCR, NASAL: MRSA by PCR Next Gen: NOT DETECTED

## 2021-07-17 LAB — LACTIC ACID, PLASMA
Lactic Acid, Venous: 1.9 mmol/L (ref 0.5–1.9)
Lactic Acid, Venous: 1.8 mmol/L (ref 0.5–1.9)

## 2021-07-17 LAB — CBC
HCT: 34.3 % — ABNORMAL LOW (ref 39.0–52.0)
Hemoglobin: 11.4 g/dL — ABNORMAL LOW (ref 13.0–17.0)
MCH: 30.4 pg (ref 26.0–34.0)
MCHC: 33.2 g/dL (ref 30.0–36.0)
MCV: 91.5 fL (ref 80.0–100.0)
Platelets: 274 10*3/uL (ref 150–400)
RBC: 3.75 MIL/uL — ABNORMAL LOW (ref 4.22–5.81)
RDW: 14.6 % (ref 11.5–15.5)
WBC: 13.8 10*3/uL — ABNORMAL HIGH (ref 4.0–10.5)
nRBC: 0 % (ref 0.0–0.2)

## 2021-07-17 LAB — CK: Total CK: 60 U/L (ref 49–397)

## 2021-07-17 LAB — MAGNESIUM: Magnesium: 2.3 mg/dL (ref 1.7–2.4)

## 2021-07-17 LAB — TROPONIN I (HIGH SENSITIVITY)
Troponin I (High Sensitivity): 70 ng/L — ABNORMAL HIGH (ref <18)
Troponin I (High Sensitivity): 70 ng/L — ABNORMAL HIGH (ref <18)

## 2021-07-17 LAB — HIV ANTIBODY (ROUTINE TESTING W REFLEX): HIV Screen 4th Generation wRfx: NONREACTIVE

## 2021-07-17 LAB — PROCALCITONIN: Procalcitonin: 0.12 ng/mL

## 2021-07-17 LAB — BRAIN NATRIURETIC PEPTIDE: B Natriuretic Peptide: 82 pg/mL (ref 0.0–100.0)

## 2021-07-17 MED ORDER — SODIUM CHLORIDE 0.9 % IV SOLN: INTRAVENOUS | Status: DC | PRN

## 2021-07-17 MED ORDER — TIZANIDINE HCL 4 MG PO TABS
4.0000 mg | ORAL_TABLET | Freq: Four times a day (QID) | ORAL | Status: DC | PRN
  Administered 2021-07-17 – 2021-07-24 (×17): 4 mg via ORAL
  Filled 2021-07-17 (×18): qty 1

## 2021-07-17 MED ORDER — PREGABALIN 75 MG PO CAPS
75.0000 mg | ORAL_CAPSULE | Freq: Two times a day (BID) | ORAL | Status: DC
  Administered 2021-07-17 – 2021-07-24 (×15): 75 mg via ORAL
  Filled 2021-07-17 (×15): qty 1

## 2021-07-17 MED ORDER — INSULIN ASPART 100 UNIT/ML IJ SOLN
6.0000 [IU] | Freq: Three times a day (TID) | INTRAMUSCULAR | Status: DC
  Administered 2021-07-17 – 2021-07-18 (×3): 6 [IU] via SUBCUTANEOUS

## 2021-07-17 MED ORDER — INSULIN GLARGINE-YFGN 100 UNIT/ML ~~LOC~~ SOLN
20.0000 [IU] | Freq: Every day | SUBCUTANEOUS | Status: DC
  Administered 2021-07-18: 20 [IU] via SUBCUTANEOUS
  Filled 2021-07-17: qty 0.2

## 2021-07-17 MED ORDER — DIGOXIN 125 MCG PO TABS
0.1250 mg | ORAL_TABLET | Freq: Every day | ORAL | Status: DC
  Administered 2021-07-17 – 2021-07-24 (×8): 0.125 mg via ORAL
  Filled 2021-07-17 (×8): qty 1

## 2021-07-17 MED ORDER — HYDROXYZINE HCL 50 MG/ML IM SOLN
25.0000 mg | Freq: Once | INTRAMUSCULAR | Status: AC
  Administered 2021-07-17: 25 mg via INTRAMUSCULAR
  Filled 2021-07-17: qty 0.5

## 2021-07-17 MED ORDER — FUROSEMIDE 10 MG/ML IJ SOLN
40.0000 mg | Freq: Once | INTRAMUSCULAR | Status: AC
  Administered 2021-07-17: 40 mg via INTRAVENOUS
  Filled 2021-07-17: qty 4

## 2021-07-17 NOTE — Consult Note (Signed)
NAME:  Luis Brown, MRN:  [id number], DOB:  09-26-1945, LOS: 2 ADMISSION DATE:  07/15/2021, CONSULTATION DATE:  D12/31/22 REFERRING MD:  Dr Cyndia Skeeters, CHIEF COMPLAINT:  resp failure   History of Present Illness:   This is a 75 year old male with poorly controlled diabetes, hemoglobin A1c 7.4, chronic opioid use for back pain, BPH on Flomax, anxiety and depression, paroxysmal A. fib on metoprolol digoxin and Xarelto .  He had long hx of back infection and was on daptomycin.  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  Was treated with steroids.  Course complicated by right upper extremity DVT secondary to PICC line for which she was on Xarelto.  He is requiring oxygen at that time.  By the summer 2022 he was off oxygen and able to ambulate even without oxygen.  Course was also complicated later in May 2022 with spontaneous pneumothorax.  By the summer 2022 he was able to come off prednisone.  He also had diagnosed of atrial fibrillation for which he was on digoxin and Xarelto was continued [although originally started for DVT].  Last seen in clinic October 2022 at which time he was off oxygen and off steroids.  He was on Trelegy inhaler for pulmonary function test that back in May 2022 showed obstructive physiology.  He says he was feeling better.  He is only minimally symptomatic.  Plan at that time was to get a repeat high-resolution CT chest done in the spring 2023.  On Thanksgiving 2022 his wife had the flu and he was exposed to this and after that he started having increasing cough and fatigue.  Up until this point he was able to do walking and fishing.  He saw  pulm nurse practitioner 06/24/2021 in the pulmonary clinic.  Was given 8-day prednisone taper for presumed diagnosis of COPD exacerbation.  No antibiotic.  He did see infectious diseases at the same time and for his back infections it was noted, "10 days of  linezolid + 8 weeks of tedizolid for his spinal infection and has now been started on dalbavancin infusions every 2 weeks last month as a suppressive regim" (none of which appear to be associated with drug-induced ILD]    He then called into the pulmonary office on 07/13/2021 felt he was desaturating mildly and coughing up green mucus and fevers..  2 COVID home test were negative.  He had a fever of 100.7.  He wanted some prednisone.  We gave him some Omnicef as well and a 5-day prednisone taper but also advised that to report to the ER.  He then presented on 07/15/2021 for admission with acute hypoxic respiratory failure 58% and a history of 1 week of cough and shortness of breath getting worse.  Appears he might not have taken Omnicef.  He had lactic acidosis 2.3 at admission, white count of nearly 15,000 with lymphopenia.  CT angiogram showed acute PE segmental and subsegmental right-sided [despite being on Xarelto] started on IV heparin started on Solu-Medrol and antibiotics . CT also showed L >>> R (opposted from may 2022) GGO. and .  His Doppler ultrasound lower extremities negative for DVT.  On 07/16/2021 he was stable on 4 L oxygen.  His flu, multiplex PCR and MRSA PCR were negative.  Procalcitonin 0.21.  Echo is normal with normal RV.  Then on 12/3 1/22 seen progressively more hypoxemic now requiring 8 L of oxygen.  Briefly required  even more but looking comfortable.  When assisted to go to the bathroom even on 8 L he desaturated to 70s he has been afebrile since admission. Trop improved from 200  prior day -> 70, lactic 1.9 with normal BNP. CXR with ?> Worsening LEft sided air space disease and ccm consulted     Past Medical History:  .  [place] from 11/10/2020 - 11/23/2020 for drug induced pneumonit   Patient presented to the hospital shortness of breath.  He was originally seen at PCPs office and found to be hypoxic.  He was referred to emergency room.  He underwent work-up with CTA  chest which showed large right sided pneumonia with groundglass nodules and concern for central cavitation.  He was started on IV antibiotics on admission.  Infectious disease was consulted given prolonged antibiotic course at home and now with atypical pneumonia.  Echocardiogram was negative for valvular vegetation.  MRI was negative for deep infection but revealed a subcutaneous probable seroma.  IR was consulted per ID for aspiration/drainage and culturing of fluid.  He underwent aspiration on 11/13/2020.  Fluid cultures obtained showing no growth.  Patient hospital course complicated by right upper extremity DVT secondary to PICC line.  Patient developed worsening respiratory symptoms and increasing oxygen requirements.  He underwent repeat CT chest which showed worsening right-sided pneumonia.  Pulmonary was consulted and he underwent bronchoscopy on 11/17/2020.  Bronchial washings were generally unremarkable.  There was discussion regarding possibly underlying daptomycin pneumonitis.  Daptomycin was discontinued when cultures patient was de-escalated to Bactrim per ID recommendations.  Patient will follow-up outpatient with ID for ongoing antibiotic management.  Bronchial washings were uemarkable for eosinophils s72% polys  which y.  Patient was discharged home on oxygen.  Will remain on steroids until follow-up with pulmonary on May 17.  He was discharged on Xarelto, given its provoked nature will likely be able to discontinue once imaging is negative in the next 3 to 6 months per protocol.  Patient was discharged on Bactrim and Xanax given profound anxiety in relation to dyspnea.  Patient is aware Xanax is not and should not be permanent fixture or addition to his home medication list and will need follow-up with PCP to manage prescriptions further.     12/01/2020 -Remains on prednisone 71m daily until follow-up and assess when to taper dose -Continues Bactim BID -FOB on 5/3 with negtive pathology, BAL  negative -Follow up with Dr. RChase Callerin ILD clinic in June    Patient reports that he is a little better.  He continues to have some shortness of breath with occasional wheezing.  He has a productive cough which has improved.  Underlying anxiety due to dyspnea symptoms.  He has had no emergencies requiring his albuterol rescue inhaler.  He used this twice since discharge.  He is using flutter valve and incentive spirometer.  He is still on Bactrim and has follow-up with infectious disease in the next week.  Reports difficulty sleeping at night due to steroid.  He has Ambien 5 mg as needed insomnia.  He is using Xanax twice a day as needed for anxiety.  He will follow-up with primary care next Monday. His appetite has improved some. Drinking protein shakes.  Denies fever chills sweats, chest tightness or chest pain.     12/15/2020 - Interim hx  Patient presents today for acute office visit. He had follow-up CT chest on 12/11/20 that showed small-moderate right pneumothorax, approx 15-20%. Previous right upper lobe consolidation  has improved without residual consolidative components. There is persistent ground glass attenuation and cystic changes throughout RUL and ground glass in RML and lower lobes. Findings may be post infectious or inflammatory versus sequale of interstitial pneumonia. On call provider was notified of critical results. Patient was called ON 12/13/20 and made aware of the results. 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 Patient did report some increase in shortness of breath over the last few days but denies chest pain. Patient opted to wait until Tuesday for follow-up CXR. If pneumothorax still present will need ED evaluation for possible chest tube placement. He has an apt in June with Dr. Chase Caller.    He feels well today. No significant change noted to his breathing. Still able to do some chores indoors. He does not get out of  breath at home. He is wearing 2L oxygen. He fell Saturday 5/28, states that he got tripped up with his dog. He hit his face on refrigerator. No LOC or change in mental status. He is on blood thinner. Neuros intact. Bruising to nose.    CXR today showed improvement apical pneumothorax that was seen on previous CT chest, possibly still present on lateral view. Had Dr. Erin Fulling review imaging. Overall reassuring. Plan continue to monitor with repeat CXR on Thursday.    Imaging:  11/23/20 CXR - Persistent right pulmonary opacities and right pleural effusion.Aeration improved compared to 11/17/2020.   12/11/20- CT CHEST WO CONTRAST IMPRESSION: 1. Small to moderate right pneumothorax, approximately 15-20%. This was not present on prior chest imaging, but is not significantly changed from recent thoracic spine radiograph 12/03/2020. 2. Previous right upper lobe consolidation has improved, without residual consolidative components. There is persistent ground-glass attenuation and cystic changes throughout the right upper lobe, with geographic pulmonary 10 UA shin and ground-glass in the right middle and lower lobes. There is mild associated right lung bronchiectasis. Findings may be due to post infectious or inflammatory sequela, versus sequela of interstitial pneumonia. 3. Improved mediastinal adenopathy from prior exam. 4. Faint ground-glass attenuation in the lingula and anterior left upper lobe was present on prior and mildly improved, likely infectious or inflammatory. 5. 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 -returns for follow-up.  Luis personally saw him in the hospital on 11/20/2020.  At that point in time he had been started on Solu-Medrol on 11/18/2020.  When Luis saw him he was on day 3 of Solu-Medrol and he was needing 3 L nasal cannula  oxygen at rest.  At that point in time even with 3 L he was desaturating to 86% just talking.  Put him on prednisone 60 mg/day.  After that he see nurse practitioner x2 at least 1 of which was a telephone visit..  As part of his follow-up on 12/11/2020 he had a CT scan of the chest [3 days after pulmonary function test] and on my personal visualization it showed new onset pneumothorax but significant improvement in infiltrates and inflammation.  He had another follow-up chest x-ray 12/17/2020 in which no pneumothorax reported [similar on 12/15/2020] and pulmonary infiltrates appear improved.  He is now here with his wife  He tells me he is now on 10 mg/day prednisone he just darted that today.  He is 1 week left  and after that the prednisone taper ends.  Because of the pneumothorax is physical therapy has been on hold but 4 days ago he was evaluated and apparently his pulse ox was 100% but this was on oxygen.  He continues to use his oxygen although he is not sure if he is desaturating anymore.  He does feel overall improved compared to 6 weeks ago but still has significant amount of fatigue.  He has significant insomnia which he believes is due to prednisone.  Today's last night was the first night he could sleep when he dropped his prednisone down to 10 mg/day.  He believes his fatigue is multifactorial related to the disease, multiple medications, lack of oxygen, insomnia etc.       02/09/2021 Patient presents today for 1 month follow-up. Since his last visit in June, he has established with cardiology and was dx with afib. He is currently on Xarelto and Digoxin. His wife states that he has had more energy recently. He is scheduled for testosterone injection tomorrow. He gets winded with stairs and heavy exertion. He reports intermittent wheezing. He needs refill of albuterol rescue inhaler. He has weaned himself of oxygen and O2 equipment has been picked up. He has been sleeping better at night. Anxiety has  improved, he stopped xanax. Due for follow-up CXR today. 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   02/23/2021- Interim hx  Patient presents today for 2 week follow-up. During his last visit he was given trial Spiriva Respimat d/t dyspnea /wheezing symptoms and moderate obstruction seen on his PFTs. He has been using sample of Spiriva Respimat 2.14mg as prescribed, he occasionally misses a dose for 1-2 days. He states that his wheezing has stopped but he has not noticed a significant difference in dyspnea symptoms. His ILD scale appears improved from two weeks go. He has having some muscle spasms in his back that started before being placed on LABA.       Follow-up drug-induced pneumonitis daptomycin from early May 2022   04/26/2021 -       Chief Complaint  Patient presents with   Follow-up      F/u, trelegy medication concerns          Luis CHAGER COMPSTON76y.o. -returns for follow-up.  Since Luis last saw him he seen nurse practitioner 2 times.  Symptom scores for dyspnea continue to improve.  He was also walked for oxygen desaturation February 04, 2019 did not desaturate.  He says he continues to get better.  He is off prednisone.  Nurse practitioner noticed obstructive physiology on the pulmonary function test.  Put him on inhaler.  Currently is on Trelegy.  He says it helps him tremendously.  He feels it opens up his lungs.  He declined his flu shot today because he will have it after the COVID-vaccine in October or November 2022.    06/24/2021- Interim hx  Patient presents today for acute OV/cough. Patient reports increased shortness of breath since 06/13/21 with associated fatigue, cough and chest tightness. Cough is productive with clear mucus. Symptoms started after Thanksgiving. His wife had the flu at the same time. His cough initially improved while taking mucinex, he stopped since he was starting to feel better. Denies f/c/s,  purulent mucus, N/V/D. CAT score 17    PFT Results Latest Ref Rng & Units 12/08/2020  FVC-Pre L 2.97  FVC-Predicted Pre % 71  FVC-Post L 2.96  FVC-Predicted Post %  71  Pre FEV1/FVC % % 66  Post FEV1/FCV % % 70  FEV1-Pre L 1.97  FEV1-Predicted Pre % 65  FEV1-Post L 2.07  DLCO uncorrected ml/min/mmHg 18.01  DLCO UNC% % 72  DLCO corrected ml/min/mmHg 20.52  DLCO COR %Predicted % 82  DLVA Predicted % 120  TLC L 5.04  TLC % Predicted % 71  RV % Predicted % 95      has a past medical history of Acute pharyngitis (10/21/2013), Allergy, Anxiety and depression (10/25/2011), BPH (benign prostatic hyperplasia) (04/23/2012), Chicken pox (as a child), DDD (degenerative disc disease), DDD (degenerative disc disease), lumbosacral, Diabetes mellitus, Dyspnea, ED (erectile dysfunction) (04/23/2012), Elevated BP, 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   reports that he quit smoking about 32 years ago. His smoking use included cigarettes. He has a 20.00 pack-year smoking history. He has never used smokeless tobacco.  Past Surgical History:  Procedure Laterality Date   BACK SURGERY  2012 and 1994   Dr Margret Chance, screws and cage in low back   BIOPSY  01/09/2020   Procedure: BIOPSY;  Surgeon: Otis Brace, MD;  Location: [facility];  Service: Gastroenterology;;   BRONCHIAL BIOPSY  11/17/2020   Procedure: BRONCHIAL BIOPSIES;  Surgeon: Laurin Coder, MD;  Location: [facility];  Service: Endoscopy;;   BRONCHIAL BRUSHINGS  11/17/2020   Procedure: BRONCHIAL BRUSHINGS;  Surgeon: Laurin Coder, MD;  Location: [facility];  Service: Endoscopy;;    BRONCHIAL WASHINGS  11/17/2020   Procedure: BRONCHIAL WASHINGS;  Surgeon: Laurin Coder, MD;  Location: [facility];  Service: Endoscopy;;   COLONOSCOPY WITH PROPOFOL N/A 01/09/2020   Procedure: COLONOSCOPY WITH PROPOFOL;  Surgeon: Otis Brace, MD;  Location: [facility];  Service: Gastroenterology;  Laterality: N/A;   ESOPHAGOGASTRODUODENOSCOPY (EGD) WITH PROPOFOL N/A 01/09/2020   Procedure: ESOPHAGOGASTRODUODENOSCOPY (EGD) WITH PROPOFOL;  Surgeon: Otis Brace, MD;  Location: [facility];  Service: Gastroenterology;  Laterality: N/A;   EYE SURGERY Bilateral    2016   HARDWARE REVISION  03/12/2019   REVISION OF HARDWARE THORACIC TEN-THORACIC ELEVEN WITH APPLICATION OF ADDITIONAL RODS AND ROD SLEEVES THORACIC EIGHT-THORACIC TWELVE 12-LUMBAR ONE (N/A )   HEMOSTASIS CONTROL  11/17/2020   Procedure: HEMOSTASIS CONTROL;  Surgeon: Laurin Coder, MD;  Location: [facility];  Service: Endoscopy;;   IR US GUIDE BX ASP/DRAIN  11/13/2020   LAMINECTOMY  02/12/2019   DECOMPRESSIVE LAMINECTOMY THORACIC NINE-THORACIC TEN AND THORACIC TEN-THORACIC ELEVEN, EXTENSION OF THORACOLUMBAR FUSION FROM THORACIC TEN TO THORACIC FIVE, LOCAL BONE GRAFT, ALLOGRAFT AND VIVIGEN (N/A)   POLYPECTOMY  01/09/2020   Procedure: POLYPECTOMY;  Surgeon: Otis Brace, MD;  Location: [facility];  Service: Gastroenterology;;   REMOVAL OF RODS AND PEDICLE SCREWS T5 AND T6, EXPLORATION OF FUSION, BIOPSY TRANSPEDICULAR T5 AND T6 (N/A )  09/14/2020   SPINAL FUSION N/A 02/12/2019   Procedure: DECOMPRESSIVE LAMINECTOMY THORACIC NINE-THORACIC TEN AND THORACIC TEN-THORACIC ELEVEN, EXTENSION OF THORACOLUMBAR FUSION FROM THORACIC TEN TO THORACIC FIVE, LOCAL BONE GRAFT, ALLOGRAFT AND VIVIGEN;  Surgeon: Jessy Oto, MD;  Location: [place];  Service: Orthopedics;  Laterality: N/A;  DECOMPRESSIVE LAMINECTOMY THORACIC NINE-THORACIC TEN AND THORACIC TEN-THORACIC ELEVEN, EXTENSION OF TH   SPINAL FUSION N/A 03/12/2019   Procedure:  REVISION OF HARDWARE THORACIC TEN-THORACIC ELEVEN WITH APPLICATION OF ADDITIONAL RODS AND ROD  SLEEVES THORACIC EIGHT-THORACIC TWELVE 12-LUMBAR ONE;  Surgeon: Jessy Oto, MD;  Location: [place];  Service: Orthopedics;  Laterality: N/A;   TONSILLECTOMY     as child   torn rotator cuff  2010   right   VIDEO BRONCHOSCOPY N/A 11/17/2020   Procedure: VIDEO BRONCHOSCOPY WITH FLUORO;  Surgeon: Laurin Coder, MD;  Location: [facility];  Service: Endoscopy;  Laterality: N/A;    Allergies  Allergen Reactions   Daptomycin     Drug induced pneumonia     Immunization History  Administered Date(s) Administered   Fluad Quad(high Dose 65+) 04/05/2019, 06/29/2020, 05/18/2021   Influenza Split 04/23/2012   Influenza Whole 02/16/2011   Influenza, High Dose Seasonal PF 05/06/2013, 03/25/2016, 05/05/2017, 06/07/2018   PFIZER(Purple Top)SARS-COV-2 Vaccination 08/23/2019, 09/13/2019, 06/07/2020   Pfizer Covid-19 Vaccine Bivalent Booster 79yr & up 04/29/2021   Pneumococcal Conjugate-13 03/25/2016   Pneumococcal Polysaccharide-23 01/20/2009, 10/19/2011   Td 07/09/2003, 07/18/2008   Tdap 07/20/2011   Zoster, Live 04/27/2016    Family History  Problem Relation Age of Onset   Hypertension Mother    Diabetes Mother        type 2   Cancer Mother 563      breast in remission   Emphysema Father        smoker   COPD Father        smoker   Stroke Father 71      mini   Heart disease Father    Hypertension Sister    Hyperlipidemia Sister    Scoliosis Sister    Osteoporosis Sister    Hypertension Maternal Grandmother    Scoliosis Maternal Grandmother    Heart disease Maternal Grandfather    Heart disease Paternal Grandfather        smoker   Heart disease Daughter      Current Facility-Administered Medications:    acetaminophen (TYLENOL) tablet 650 mg, 650 mg, Oral, Q6H PRN **OR** acetaminophen (TYLENOL) suppository 650 mg, 650 mg, Rectal, Q6H PRN, Doutova, Anastassia, MD   arformoterol  (BROVANA) nebulizer solution 15 mcg, 15 mcg, Nebulization, BID, 15 mcg at 07/17/21 0936 **AND** umeclidinium bromide (INCRUSE ELLIPTA) 62.5 MCG/ACT 1 puff, 1 puff, Inhalation, Daily, Doutova, Anastassia, MD, 1 puff at 07/17/21 0936   atorvastatin (LIPITOR) tablet 10 mg, 10 mg, Oral, Daily, Doutova, Anastassia, MD, 10 mg at 07/17/21 0913   azithromycin (ZITHROMAX) 500 mg in sodium chloride 0.9 % 250 mL IVPB, 500 mg, Intravenous, Q24H, Doutova, Anastassia, MD, Stopped at 07/16/21 2248   budesonide (PULMICORT) nebulizer solution 0.5 mg, 0.5 mg, Nebulization, BID, GCyndia Skeeters Taye T, MD, 0.5 mg at 07/17/21 0936   ceFEPIme (MAXIPIME) 2 g in sodium chloride 0.9 % 100 mL IVPB, 2 g, Intravenous, Q8H, JAngela Adam RPH, Last Rate: 200 mL/hr at 07/17/21 1149, 2 g at 07/17/21 1149   guaiFENesin (MUCINEX) 12 hr tablet 600 mg, 600 mg, Oral, BID, Doutova, Anastassia, MD, 600 mg at 07/17/21 0914   heparin ADULT infusion 100 units/mL (25000 units/2578m, 1,700 Units/hr, Intravenous, Continuous, JaAngela AdamRPH, Last Rate: 17 mL/hr at 07/17/21 1221, 1,700 Units/hr at 07/17/21 1221   HYDROcodone-acetaminophen (NORCO/VICODIN) 5-325 MG per tablet 1-2 tablet, 1-2 tablet, Oral, Q4H PRN, Doutova, Anastassia, MD, 2 tablet at 07/16/21 1340   insulin aspart (novoLOG) injection 0-20 Units, 0-20 Units, Subcutaneous, TID WC, Gonfa, Taye T, MD, 11 Units at 07/17/21 1225   insulin aspart (novoLOG) injection 0-5 Units, 0-5 Units, Subcutaneous, QHS, Gonfa, Taye T, MD, 5 Units  at 07/16/21 2248   insulin aspart (novoLOG) injection 6 Units, 6 Units, Subcutaneous, TID WC, Gonfa, Taye T, MD   [START ON 07/18/2021] insulin glargine-yfgn (SEMGLEE) injection 20 Units, 20 Units, Subcutaneous, Daily, Gonfa, Taye T, MD   ipratropium (ATROVENT) nebulizer solution 0.5 mg, 0.5 mg, Nebulization, Q6H PRN, Gonfa, Taye T, MD   levalbuterol (XOPENEX) nebulizer solution 0.63 mg, 0.63 mg, Nebulization, Q6H PRN, Gonfa, Taye T, MD   MEDLINE mouth  rinse, 15 mL, Mouth Rinse, BID, Gonfa, Taye T, MD, 15 mL at 07/17/21 1029   melatonin tablet 5 mg, 5 mg, Oral, QHS PRN, Doutova, Anastassia, MD   methylPREDNISolone sodium succinate (SOLU-MEDROL) 125 mg/2 mL injection 80 mg, 80 mg, Intravenous, Q12H, Gonfa, Taye T, MD, 80 mg at 07/17/21 0915   oxyCODONE-acetaminophen (PERCOCET/ROXICET) 5-325 MG per tablet 1-2 tablet, 1-2 tablet, Oral, Q4H PRN, Roel Cluck, Anastassia, MD, 1 tablet at 07/17/21 1313   pantoprazole (PROTONIX) EC tablet 40 mg, 40 mg, Oral, Daily, Doutova, Anastassia, MD, 40 mg at 07/17/21 0914   tamsulosin (FLOMAX) capsule 0.8 mg, 0.8 mg, Oral, Daily, Doutova, Anastassia, MD, 0.8 mg at 07/17/21 0913   vancomycin (VANCOREADY) IVPB 1750 mg/350 mL, 1,750 mg, Intravenous, Q24H, Angela Adam, RPH, Last Rate: 5 mL/hr at 07/17/21 0600, Infusion Verify at 07/17/21 0600   venlafaxine XR (EFFEXOR-XR) 24 hr capsule 150 mg, 150 mg, Oral, Q breakfast, Doutova, Anastassia, MD, 150 mg at 07/17/21 [facility] Events:  07/15/2021 - admit   Interim History / Subjective:   07/17/2021 - seen in bed 1411.  Nursing reports that currently at 8 L nasal cannula/90% when he stands up he will desaturate easily.  Chest x-ray done today suggest worsening left-sided airway inflammation.  He is already on Solu-Medrol 125 mg twice daily since today.  IV heparin continues.  Objective   Blood pressure (!) 107/54, pulse (!) 58, temperature 98.4 F (36.9 C), temperature source Axillary, resp. rate (!) 22, height _0  (1.778 m), weight 77.1 kg, SpO2 97 %.        Intake/Output Summary (Last 24 hours) at 07/17/2021 1330 Last data filed at 07/17/2021 0600 Gross per 24 hour  Intake 1173.29 ml  Output 600 ml  Net 573.29 ml   Filed Weights   07/15/21 1625  Weight: 77.1 kg    Examination: General: Thin male.  Looking comfortable on oxygen but when he talks he does get a little bit dyspneic HENT: Nasal cannula 8 L oxygen on Lungs:  Overall clear but on the left side posteriorly he does have some crackles.  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  Has new onset pulmonary embolism despite Xarelto [was restarted in the spring 2022 for PICC line associated DVT but continued for A. 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  Is now started on IV steroids.  Infectious disease work-up negative so far.  Differential diagnosis includes recurrence of pneumonitis [Boop after prior steroids being weaned off] OR eosinophilic pneumonias that drug and related or postviral acute lung injury from fluid exposure since Thanksgiving developing slowly OR acute on chronic diastolic dysfunction [clinically looks euvolemic] OR worsening PE but this is doubtful [biomarkers of worsening PE are stable]  P:   Bedrest  Continue IV heparin for PE but if continues to get worse get urgent echo for RV dysfunction  Do ILD workup  -get high-resolution CT chest, -  sedimentation rate, CK, serology,-since pneumonitis profile, HIV and urine drug screen  Continue IV steroids   Oxygen  for pulse ox goal greater than 92%  Continue antibiotics  Okay to continue IV steroids  Depending on the course might need bronchoscopy with lavage [given the recent PE not a good candidate for surgical lung biopsy]  Moved to stepdown if he gets worse for BiPAP/intubation    Best practice (daily eval):  According to the hospitalist      ATTESTATION & SIGNATURE   The patient Luis Brown is critically ill with multiple organ systems failure and requires high complexity decision making for assessment and support, frequent evaluation and titration of therapies, application of advanced monitoring technologies and extensive interpretation of multiple databases.   Critical Care Time devoted to patient care services described in this note is  60  Minutes. This time reflects time of care of this signee Dr Luis Brown. This critical care time does not reflect procedure time, or teaching time or supervisory time of PA/NP/Med student/Med Resident etc but could involve care discussion time      SIGNATURE    Dr. Brand Brown, M.D., F.C.C.P,  Pulmonary and Critical Care Medicine Staff Physician, [facility] Director - Interstitial Lung Disease  Program  P[facility] [place] [facility] [place], [place], [address]  NPI Number:  NPI #[id number]  Pager: [phone], If no answer  -> Check AMION or Try [phone] Telephone (clinical office): [phone] Telephone (research): [phone]  1:30 PM 07/17/2021   07/17/2021 1:30 PM    LABS    PULMONARY Recent Labs  Lab 07/16/21 0449 07/17/21 0428  PHART  --  7.436  PCO2ART  --  33.8  PO2ART  --  66.9*  HCO3 25.3 22.4  O2SAT 92.3 92.0    CBC Recent Labs  Lab 07/15/21 1715 07/16/21 0449 07/17/21 0508  HGB 12.3* 11.7* 11.4*  HCT 36.2* 33.8* 34.3*  WBC 14.9* 12.9* 13.8*  PLT 338 229 274    COAGULATION Recent Labs  Lab 07/15/21 1715  INR 1.3*     CARDIAC  No results for input(s): TROPONINI in the last 168 hours. No results for input(s): PROBNP in the last 168 hours.  CHEMISTRY Recent Labs  Lab 07/15/21 1715 07/16/21 0449 07/17/21 0508  NA 127* 131* 135  K 4.2 4.4 4.4  CL 93* 95* 100  CO2 _0 GLUCOSE 482* 212* 286*  BUN 25* 19 27*  CREATININE 0.91 0.52* 0.68  CALCIUM 8.7* 8.4* 8.5*  MG  --  1.8 2.3  PHOS  --  3.1 3.1   Estimated Creatinine Clearance: 79.8 mL/min (by C-G formula based on SCr of 0.68 mg/dL).   LIVER Recent Labs  Lab 07/15/21 1715 07/16/21 0449 07/17/21 0508  AST 22 21  --   ALT 17 18  --   ALKPHOS 157* 143*  --   BILITOT 1.3* 1.5*  --   PROT 6.4* 5.9*  --   ALBUMIN 3.4* 2.9* 2.8*  INR 1.3*  --   --      INFECTIOUS Recent Labs  Lab 07/15/21 1836 07/15/21 2225 07/17/21 1107  LATICACIDVEN 2.3* 1.5 1.9  PROCALCITON  --  0.21  --      ENDOCRINE CBG (last 3)  Recent Labs    07/17/21 0507 07/17/21 0743 07/17/21 1129  GLUCAP 252* 287* 261*         IMAGING x48h  - image(s) personally visualized  -   highlighted in bold DG Chest 2 View  Result Date: 07/15/2021 CLINICAL DATA:  Short of breath.  Hypoxemia. EXAM: CHEST - 2 VIEW COMPARISON:  02/09/2021 FINDINGS: Extensive asymmetric airspace disease throughout the left upper lobe and left lower lobe. Small amount of airspace disease in the right lateral lung base. No pleural effusion. Heart size normal.  Vascularity normal Extensive thoracic and lumbar fusion hardware. IMPRESSION: Extensive asymmetric airspace disease on the left and a small amount of airspace disease in the right. Probable pneumonia Electronically Signed   By: Franchot Gallo M.D.   On: 07/15/2021 18:26   CT Angio Chest Pulmonary Embolism (PE) W or WO Contrast  Result Date: 07/16/2021 CLINICAL DATA:  Pulmonary embolism suspected.  High probability. EXAM: CT ANGIOGRAPHY CHEST WITH CONTRAST TECHNIQUE: Multidetector CT imaging of the chest was performed using  the standard protocol during bolus administration of intravenous contrast. Multiplanar CT image reconstructions and MIPs were obtained to evaluate the vascular anatomy. CONTRAST:  67m OMNIPAQUE IOHEXOL 350 MG/ML SOLN COMPARISON:  PA and lateral chest today, chest film 02/09/2021, chest CT without contrast 12/11/2020 and CTA chest 11/10/2020. FINDINGS: Cardiovascular: There is mild cardiomegaly with right chamber predominance, RV/LV ratio 1.2 indicating likely right heart strain and prominence of the pulmonary trunk to 3.3 cm. There are segmental and subsegmental occlusive emboli in the right lower lobe posterior base and in the right upper lobe apical and posterior segments. There may questionably be small-vessel emboli in the right middle lobe periphery but this is difficult to assess due to breathing motion. There is no pericardial effusion. There are scattered calcifications in the LAD and circumflex coronary arteries thoracic aorta. There is no aortic dissection or aneurysm. The great vessels opacify normally. Mediastinum/Nodes: There are mildly enlarged left-sided pre-vascular lymph nodes compared to the last 2 studies, for example a lymph node measuring 1.1 cm in short axis on series 4 axial 57, prominent subcarinal nodal complex now measuring 1.5 cm in short axis on axial 74, and decreased prominence of right hilar lymph nodes, largest of these 1.4 cm in short axis on axial 67. No thyroid or esophageal mass is seen. Lungs/Pleura: Extensive interstitial and ground-glass consolidation in the left lung is noted with denser patchy consolidation in the upper half of the left upper lobe. Background mild centrilobular and paraseptal emphysematous change in the upper lobes. There are much less dense faint patchy ground-glass opacities in the right lower lobe probably post pneumonic scarring and similar to the last CT, with new areas of denser patchy ground-glass infiltrate in the anterior segment of the right upper  lobe. There are increased small cystic spaces in the consolidation primarily within the left upper lobe and superior segment left lower lobe concerning for a necrotizing pneumonic process.  No pulmonary abscess is seen. There are only trace pleural effusions and no loculated collection. Relatively small caliber in the central airways may suggest reactive airways disease or bronchospasm and there is mild bronchial thickening in both lower lobes. Upper Abdomen: No acute abnormality. Musculoskeletal: Additional comparison is made to a thoracic spine CT of 03/26/2021. Extensive posterior spinal fusion hardware is again noted, evidence of previously removed pedicle screws at T5, T6 and T10 with posterior rods and pedicle screws again beginning at the level of T7. 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 Evidence of prior spondylodiscitis and vertebral body ankylosis again noted T4-5 and T9-10. No new endplate erosive disease is seen. Bilateral paravertebral soft tissue prominence along the mid to lower thoracic spine is similar. Previously described as likely phlegmon. There are no paraspinal fluid collections. Review of the MIP images confirms the above findings. IMPRESSION: 1. Segmental and subsegmental arterial emboli in the right upper and lower lobes. The overall clot burden appears small to moderate. There may be additional small vessel emboli in the right middle lobe, difficult to confirm given respiratory motion. 2. Cardiomegaly with a right chamber predominance indicating right heart strain with prominent pulmonary trunk and no further visible emboli. 3. Interstitial and extensive ground-glass and patchy consolidative change in the left lung with scattered ground-glass infiltrates in the anterior segment of the right upper lobe. Background COPD. Probable multi  lobar pneumonia, previously seen in different locations with similar appearance. 4. Increased small cystic spaces in the left lung consolidation concerning for necrotizing pneumonic component. Follow-up study recommended after treatment. 5. Increased mildly enlarged mediastinal and right hilar lymph nodes likely reactive. 6. Aortic and coronary artery atherosclerosis. 7. 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 Paraspinal soft tissue fullness appear similar. Electronically Signed   By: Telford Nab M.D.   On: 07/16/2021 00:16   DG Chest Port 1 View  Result Date: 07/17/2021 CLINICAL DATA:  75 year old male with increasing shortness of breath. EXAM: PORTABLE CHEST 1 VIEW COMPARISON:  Chest x-ray 07/15/2021. FINDINGS: Widespread areas of interstitial prominence and patchy asymmetrically distributed airspace disease is again noted in the lungs bilaterally (left greater than right). Compared to the prior examination, there is worsening aeration in the right upper lobe. No definite pleural effusions. No pneumothorax. No evidence of pulmonary edema. Heart size is normal. Upper mediastinal contours are within normal limits. Atherosclerotic calcifications are noted in the thoracic aorta. Orthopedic rod and screw fixation hardware noted throughout the thoracolumbar spine. IMPRESSION: 1. Worsening multilobar bilateral pneumonia, as above. 2. Aortic atherosclerosis. Electronically Signed   By: Vinnie Langton M.D.   On: 07/17/2021 10:15   ECHOCARDIOGRAM COMPLETE  Result Date: 07/16/2021    ECHOCARDIOGRAM REPORT   Patient Name:   Luis Brown Date of Exam: 07/16/2021 Medical Rec #:  [id number]        Height:       70.0 in Accession #:    [id number]       Weight:       170.0 lb Date of Birth:  [birth year]         BSA:          1.948 m Patient Age:    13 years          BP:  105/65 mmHg Patient Gender: M                HR:           71 bpm. Exam Location:  Inpatient Procedure: 2D Echo, Cardiac Doppler and Color Doppler Indications:    Pulmonary embolus  History:        Patient has prior history of Echocardiogram examinations. COPD,                 Arrythmias:Atrial Fibrillation; Risk Factors:Hypertension and                 Diabetes.  Sonographer:    Jyl Heinz Referring Phys: [place]  1. Left ventricular ejection fraction, by estimation, is 60 to 65%. The left ventricle has normal function. The left ventricle has no regional wall motion abnormalities. Left ventricular diastolic parameters were normal.  2. Right ventricular systolic function is normal. The right ventricular size is normal. Tricuspid regurgitation signal is inadequate for assessing PA pressure.  3. The mitral valve is grossly normal. Trivial mitral valve regurgitation. No evidence of mitral stenosis.  4. The aortic valve is tricuspid. Aortic valve regurgitation is not visualized. No aortic stenosis is present.  5. The inferior vena cava is normal in size with greater than 50% respiratory variability, suggesting right atrial pressure of 3 mmHg. Conclusion(s)/Recommendation(s): Normal biventricular function without evidence of hemodynamically significant valvular heart disease. FINDINGS  Left Ventricle: Left ventricular ejection fraction, by estimation, is 60 to 65%. The left ventricle has normal function. The left ventricle has no regional wall motion abnormalities. The left ventricular internal cavity size was normal in size. There is  no left ventricular hypertrophy. Left ventricular diastolic parameters were normal. Right Ventricle: The right ventricular size is normal. No increase in right ventricular wall thickness. Right ventricular systolic function is normal. Tricuspid regurgitation signal is inadequate for assessing PA pressure. Left Atrium: Left atrial size was  normal in size. Right Atrium: Right atrial size was normal in size. Pericardium: There is no evidence of pericardial effusion. Mitral Valve: The mitral valve is grossly normal. Trivial mitral valve regurgitation. No evidence of mitral valve stenosis. Tricuspid Valve: The tricuspid valve is grossly normal. Tricuspid valve regurgitation is trivial. No evidence of tricuspid stenosis. Aortic Valve: The aortic valve is tricuspid. Aortic valve regurgitation is not visualized. No aortic stenosis is present. Aortic valve peak gradient measures 12.4 mmHg. Pulmonic Valve: The pulmonic valve was grossly normal. Pulmonic valve regurgitation is trivial. No evidence of pulmonic stenosis. Aorta: The aortic root and ascending aorta are structurally normal, with no evidence of dilitation. Venous: The inferior vena cava is normal in size with greater than 50% respiratory variability, suggesting right atrial pressure of 3 mmHg. IAS/Shunts: The atrial septum is grossly normal.  LEFT VENTRICLE PLAX 2D LVIDd:         4.80 cm      Diastology LVIDs:         3.00 cm      LV e' medial:    6.96 cm/s LV PW:         1.10 cm      LV E/e' medial:  10.1 LV IVS:        1.10 cm      LV e' lateral:   7.72 cm/s LVOT diam:     2.10 cm      LV E/e' lateral: 9.1 LV SV:         88 LV SV Index:  45 LVOT Area:     3.46 cm  LV Volumes (MOD) LV vol d, MOD A2C: 118.0 ml LV vol d, MOD A4C: 106.0 ml LV vol s, MOD A2C: 44.1 ml LV vol s, MOD A4C: 38.3 ml LV SV MOD A2C:     73.9 ml LV SV MOD A4C:     106.0 ml LV SV MOD BP:      71.2 ml RIGHT VENTRICLE             IVC RV Basal diam:  3.70 cm     IVC diam: 1.20 cm RV Mid diam:    2.90 cm RV S prime:     13.80 cm/s TAPSE (M-mode): 2.6 cm LEFT ATRIUM             Index        RIGHT ATRIUM           Index LA diam:        3.80 cm 1.95 cm/m   RA Area:     15.30 cm LA Vol (A2C):   47.2 ml 24.23 ml/m  RA Volume:   39.90 ml  20.48 ml/m LA Vol (A4C):   46.4 ml 23.82 ml/m LA Biplane Vol: 50.1 ml 25.72 ml/m  AORTIC VALVE  AV Area (Vmax): 2.40 cm AV Vmax:        176.00 cm/s AV Peak Grad:   12.4 mmHg LVOT Vmax:      122.00 cm/s LVOT Vmean:     83.500 cm/s LVOT VTI:       0.255 m  AORTA Ao Root diam: 3.10 cm Ao Asc diam:  3.00 cm MITRAL VALVE MV Area (PHT): 3.65 cm    SHUNTS MV Decel Time: 208 msec    Systemic VTI:  0.26 m MV E velocity: 70.60 cm/s  Systemic Diam: 2.10 cm MV A velocity: 85.90 cm/s MV E/A ratio:  0.82 Eleonore Chiquito MD Electronically signed by Eleonore Chiquito MD Signature Date/Time: 07/16/2021/12:45:35 PM    Final    VAS [place] LOWER EXTREMITY VENOUS (DVT)  Result Date: 07/16/2021  Lower Venous DVT Study Patient Name:  Luis Brown  Date of Exam:   07/16/2021 Medical Rec #: [id number]         Accession #:    [id number] Date of Birth: 05-Aug-1945          Patient Gender: M Patient Age:   61 years Exam Location:  [facility] Procedure:      VAS [place] LOWER EXTREMITY VENOUS (DVT) Referring Phys: Nyoka Lint DOUTOVA --------------------------------------------------------------------------------  Indications: Pulmonary embolism.  Risk Factors: Confirmed PE. Anticoagulation: Heparin. Comparison Study: No prior studies. Performing Technologist: Oliver Hum RVT  Examination Guidelines: A complete evaluation includes B-mode imaging, spectral Doppler, color Doppler, and power Doppler as needed of all accessible portions of each vessel. Bilateral testing is considered an integral part of a complete examination. Limited examinations for reoccurring indications may be performed as noted. The reflux portion of the exam is performed with the patient in reverse Trendelenburg.  +---------+---------------+---------+-----------+----------+--------------+  RIGHT     Compressibility Phasicity Spontaneity Properties Thrombus Aging  +---------+---------------+---------+-----------+----------+--------------+  CFV       Full            Yes       Yes                                     +---------+---------------+---------+-----------+----------+--------------+  SFJ       Full                                                             +---------+---------------+---------+-----------+----------+--------------+  FV Prox   Full                                                             +---------+---------------+---------+-----------+----------+--------------+  FV Mid    Full                                                             +---------+---------------+---------+-----------+----------+--------------+  FV Distal Full                                                             +---------+---------------+---------+-----------+----------+--------------+  PFV       Full                                                             +---------+---------------+---------+-----------+----------+--------------+  POP       Full            Yes       Yes                                    +---------+---------------+---------+-----------+----------+--------------+  PTV       Full                                                             +---------+---------------+---------+-----------+----------+--------------+  PERO      Full                                                             +---------+---------------+---------+-----------+----------+--------------+   +---------+---------------+---------+-----------+----------+--------------+  LEFT      Compressibility Phasicity Spontaneity Properties Thrombus Aging  +---------+---------------+---------+-----------+----------+--------------+  CFV       Full            Yes       Yes                                    +---------+---------------+---------+-----------+----------+--------------+  SFJ       Full                                                             +---------+---------------+---------+-----------+----------+--------------+  FV Prox   Full                                                              +---------+---------------+---------+-----------+----------+--------------+  FV Mid    Full                                                             +---------+---------------+---------+-----------+----------+--------------+  FV Distal Full                                                             +---------+---------------+---------+-----------+----------+--------------+  PFV       Full                                                             +---------+---------------+---------+-----------+----------+--------------+  POP       Full            Yes       Yes                                    +---------+---------------+---------+-----------+----------+--------------+  PTV       Full                                                             +---------+---------------+---------+-----------+----------+--------------+  PERO      Full                                                             +---------+---------------+---------+-----------+----------+--------------+     Summary: RIGHT: - There is no evidence of deep vein thrombosis in the lower extremity.  - No cystic structure found in the popliteal fossa.  LEFT: - There is no evidence of deep vein thrombosis in the lower extremity.  - No cystic structure found in the popliteal fossa.  *See table(s)  above for measurements and observations. Electronically signed by Monica Martinez MD on 07/16/2021 at 12:36:24 PM.    Final

## 2021-07-17 NOTE — Progress Notes (Signed)
PROGRESS NOTE  Luis Brown HGD:[id number] DOB: 1946/03/31   PCP: Mackie Pai, PA-C  Patient is from: Home.  Lives with his wife.  Independently ambulates at baseline.  DOA: 07/15/2021 LOS: 2  Chief complaints:  Chief Complaint  Patient presents with   Low O2 Sats     Brief Narrative / Interim history: 75 year old M with PMH of COPD not on oxygen, DM-2, daptomycin induced pneumonitis, A. fib/RUE DVT in 11/2020 on Xarelto, HLD, BPH, DDD, colonic polyps, anxiety and depression presenting with progressive shortness of breath for 1 week, hypoxemia, chest tightness and cough, and admitted for severe sepsis and acute hypoxic respiratory failure in the setting of CAP, left> right, and right-sided segmental and subsegmental PE with small to moderate blood clot burden.  He was hypoxic to 58% on RA while in ED.  Cultures obtained.  He was a started on vancomycin, cefepime, azithromycin, steroid and IV heparin.  PCCM consulted.   Initially, patient improved and he was weaned to 2 L by nasal cannula.  However, he had an episode of desaturation to 60% with respiratory distress the early morning of 12/31 requiring up to 15 L by nasal cannula.  ABG within normal except for PO2 of 66.9%.  Personally reviewed portable CXR with worsening opacity in left lung and right lung.  BNP within normal.  Troponin 70.  Lactic acid 1.9.  PCCM reconsulted  Subjective: Patient had an episode of desaturation to 60s with respiratory distress about 3 AM requiring up to 15 L by nasal cannula.  ABG within normal except for PO2 of 66.9%.  Hypoxia and respiratory distress improved later this morning.  He is waiting to evaluated by [place].  Personally reviewed portable CXR with worsening opacity in left lung and right lung.  BNP within normal.  Troponin 70.  Lactic acid 1.9.  PCCM reconsulted  Objective: Vitals:   07/17/21 0938 07/17/21 1040 07/17/21 1100 07/17/21 1331  BP:  (!) 112/57 (!) 107/54 (!) 179/76  Pulse:  (!) 56  (!) 58 97  Resp:  (!) 22 (!) 22   Temp:  98.4 F (36.9 C)  (!) 97.2 F (36.2 C)  TempSrc:  Axillary  Oral  SpO2: (!) 87% 98% 97%   Weight:      Height:        Examination:  GENERAL: No apparent distress.  Nontoxic. HEENT: MMM.  Vision and hearing grossly intact.  NECK: Supple.  No apparent JVD.  RESP: 97% on 8 L by [place].  No IWOB.  Crackles over LLL. CVS:  RRR. Heart sounds normal.  ABD/GI/GU: BS+. Abd soft, NTND.  MSK/EXT:  Moves extremities. No apparent deformity. No edema.  SKIN: no apparent skin lesion or wound NEURO: Awake and alert. Oriented appropriately.  No apparent focal neuro deficit. PSYCH: Calm. Normal affect.   Procedures:  None  Microbiology summarized: [id number], influenza PCR and full RVP panel negative. MRSA PCR pending. Blood cultures NGTD.  Assessment & Plan: Severe sepsis due to community-acquired pneumonia: POA.  Has leukocytosis, tachypnea, hypoxemia and lactic acidosis.  CXR and CTA showed bilateral pneumonia, left> right, worse on repeat CXR.  Culture data as above.  Recently started on cefdinir by his pulmonologist without improvement.  Pro-Cal slightly elevated to 0.21. -Continue vancomycin, cefepime and azithromycin -Can discontinue vancomycin if MRSA PCR screen negative. -Incentive spirometry/OOB -Mucolytic's and antitussive  Acute segmental and subsegmental right-sided acute pulmonary embolism-CTA with RUL and RLL PE History of RUE DVT in 11/2020 -Patient reports good compliance with Xarelto  which makes him Xarelto failure. -Lower extremity US negative for DVT. -TTE without significant finding. -Continue IV heparin for now -Discussed with hematology, Dr. Lindi Adie who recommended Pradaxa and lupus anticoagulant panel  COPD exacerbation-it seems he was on prednisone taper since 07/08/2021 -Continue Solu-Medrol 80 mg twice daily. -May need prolonged steroid taper on discharge. -Continue Brovana, Pulmicort and Incruse -Xopenex and ipratropium  every 6 hours as needed  Acute hypoxic respiratory failure due to pneumonia, PE and COPD.  Desaturated to 56% on RA while in ED on presentation requiring up to 10L.He improved and weaned to 2 L but desaturated to 60s the early morning of 12/31 requiring up to 15 L.  Now he is down to 8L with improvement in respiratory distress.  BNP normal.  Troponin improved.  Lactic acid 1.8. -Trial of IV Lasix 40 mg x 1 -Continue weaning off oxygen -Manage pneumonia, PE and COPD as above. -Incentive spirometry, OOB -PCCM consulted  Elevated troponin: Likely demand ischemia in the setting of respiratory failure, sepsis and respiratory failure.  TTE reassuring.  Patient without chest pain.  Paroxysmal A. fib: On metoprolol, digoxin and Xarelto.  Digoxin subtherapeutic at 0.4. -Continue holding metoprolol given soft blood pressure -Resume home digoxin -Anticoagulation as above  Uncontrolled NIDDM-2 with hyperglycemia: A1c 7.4%.  Hyperglycemia likely due to steroid Recent Labs  Lab 07/16/21 2239 07/16/21 2307 07/17/21 0507 07/17/21 0743 07/17/21 1129  GLUCAP 385* 386* 252* 287* 261*  -Continue SSI-resistant scale -Increase NovoLog from 4 to 6 units 3 times daily with meals -Increase basal insulin from 15 to 20 units daily -Further adjustment as appropriate  Hyponatremia: Na 131, ajusts to 134 for hypoglycemia.  Urine sodium slightly elevated to 35.  TSH within normal.  Improving with IV fluid which argues against SIADH. -Continue IV NS  DDD/chronic back pain -Continue home Norco.  BPH without LUTS -Continue home Flomax.  Anxiety/depression: Stable. 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  No eosinophilia on CBC -Currently on a steroid for COPD exacerbation   Body mass index is 24.39 kg/m.         DVT prophylaxis:    Code Status: Full code Family Communication: Updated patient's wife at bedside. Level of care: Progressive Status  is: Inpatient  Remains inpatient appropriate because: Due to severe sepsis requiring IV antibiotics, hypoxemia requiring supplemental oxygen, COPD exacerbation requiring IV Solu-Medrol and PE requiring further evaluation    Final disposition: Likely home once medically stable.  Consultants:  Pulmonology   Sch Meds:  Scheduled Meds:  arformoterol  15 mcg Nebulization BID   And   umeclidinium bromide  1 puff Inhalation Daily   atorvastatin  10 mg Oral Daily   budesonide (PULMICORT) nebulizer solution  0.5 mg Nebulization BID   digoxin  0.125 mg Oral Daily   guaiFENesin  600 mg Oral BID   insulin aspart  0-20 Units Subcutaneous TID WC   insulin aspart  0-5 Units Subcutaneous QHS   insulin aspart  6 Units Subcutaneous TID WC   [START ON 07/18/2021] insulin glargine-yfgn  20 Units Subcutaneous Daily   mouth rinse  15 mL Mouth Rinse BID   methylPREDNISolone (SOLU-MEDROL) injection  80 mg Intravenous Q12H   pantoprazole  40 mg Oral Daily   pregabalin  75 mg Oral BID   tamsulosin  0.8 mg Oral Daily   venlafaxine XR  150 mg Oral Q breakfast   Continuous Infusions:  azithromycin Stopped (07/16/21 2248)   ceFEPime (MAXIPIME) IV 2 g (  07/17/21 1149)   heparin 1,700 Units/hr (07/17/21 1221)   vancomycin 5 mL/hr at 07/17/21 0600   PRN Meds:.acetaminophen **OR** acetaminophen, HYDROcodone-acetaminophen, ipratropium, levalbuterol, melatonin, oxyCODONE-acetaminophen, tiZANidine  Antimicrobials: Anti-infectives (From admission, onward)    Start     Dose/Rate Route Frequency Ordered Stop   07/16/21 2200  vancomycin (VANCOREADY) IVPB 1750 mg/350 mL        1,750 mg 175 mL/hr over 120 Minutes Intravenous Every 24 hours 07/16/21 0242     07/16/21 2000  azithromycin (ZITHROMAX) 500 mg in sodium chloride 0.9 % 250 mL IVPB        500 mg 250 mL/hr over 60 Minutes Intravenous Every 24 hours 07/16/21 0436     07/16/21 1930  cefTRIAXone (ROCEPHIN) 1 g in sodium chloride 0.9 % 100 mL IVPB  Status:   Discontinued        1 g 200 mL/hr over 30 Minutes Intravenous Every 24 hours 07/16/21 0436 07/16/21 0438   07/16/21 0400  ceFEPIme (MAXIPIME) 2 g in sodium chloride 0.9 % 100 mL IVPB        2 g 200 mL/hr over 30 Minutes Intravenous Every 8 hours 07/16/21 0200     07/16/21 0215  vancomycin (VANCOCIN) IVPB 1000 mg/200 mL premix        1,000 mg 200 mL/hr over 60 Minutes Intravenous  Once 07/16/21 0200 07/16/21 0441   07/15/21 1915  cefTRIAXone (ROCEPHIN) 1 g in sodium chloride 0.9 % 100 mL IVPB        1 g 200 mL/hr over 30 Minutes Intravenous  Once 07/15/21 1912 07/15/21 2140   07/15/21 1915  azithromycin (ZITHROMAX) 500 mg in sodium chloride 0.9 % 250 mL IVPB        500 mg 250 mL/hr over 60 Minutes Intravenous  Once 07/15/21 1912 07/15/21 2139        I have personally reviewed the following labs and images: CBC: Recent Labs  Lab 07/15/21 1715 07/16/21 0449 07/17/21 0508  WBC 14.9* 12.9* 13.8*  NEUTROABS 13.2* 12.0*  --   HGB 12.3* 11.7* 11.4*  HCT 36.2* 33.8* 34.3*  MCV 89.6 89.9 91.5  PLT 338 229 274   BMP &GFR Recent Labs  Lab 07/15/21 1715 07/16/21 0449 07/17/21 0508  NA 127* 131* 135  K 4.2 4.4 4.4  CL 93* 95* 100  CO2 23 25 23   GLUCOSE 482* 212* 286*  BUN 25* 19 27*  CREATININE 0.91 0.52* 0.68  CALCIUM 8.7* 8.4* 8.5*  MG  --  1.8 2.3  PHOS  --  3.1 3.1   Estimated Creatinine Clearance: 79.8 mL/min (by C-G formula based on SCr of 0.68 mg/dL). Liver & Pancreas: Recent Labs  Lab 07/15/21 1715 07/16/21 0449 07/17/21 0508  AST 22 21  --   ALT 17 18  --   ALKPHOS 157* 143*  --   BILITOT 1.3* 1.5*  --   PROT 6.4* 5.9*  --   ALBUMIN 3.4* 2.9* 2.8*   No results for input(s): LIPASE, AMYLASE in the last 168 hours. No results for input(s): AMMONIA in the last 168 hours. Diabetic: Recent Labs    07/15/21 2225  HGBA1C 7.4*   Recent Labs  Lab 07/16/21 2239 07/16/21 2307 07/17/21 0507 07/17/21 0743 07/17/21 1129  GLUCAP 385* 386* 252* 287* 261*    Cardiac Enzymes: Recent Labs  Lab 07/17/21 0508  CKTOTAL 60   No results for input(s): PROBNP in the last 8760 hours. Coagulation Profile: Recent Labs  Lab 07/15/21 1715  INR 1.3*   Thyroid Function Tests: Recent Labs    07/16/21 1000  TSH 1.816   Lipid Profile: No results for input(s): CHOL, HDL, LDLCALC, TRIG, CHOLHDL, LDLDIRECT in the last 72 hours. Anemia Panel: No results for input(s): VITAMINB12, FOLATE, FERRITIN, TIBC, IRON, RETICCTPCT in the last 72 hours. Urine analysis:    Component Value Date/Time   COLORURINE YELLOW 07/15/2021 1636   APPEARANCEUR CLEAR 07/15/2021 1636   LABSPEC 1.024 07/15/2021 1636   PHURINE 6.0 07/15/2021 1636   GLUCOSEU >=500 (A) 07/15/2021 1636   HGBUR SMALL (A) 07/15/2021 1636   BILIRUBINUR NEGATIVE 07/15/2021 1636   BILIRUBINUR negative 10/08/2019 1430   KETONESUR NEGATIVE 07/15/2021 1636   PROTEINUR NEGATIVE 07/15/2021 1636   UROBILINOGEN negative (A) 10/08/2019 1430   UROBILINOGEN 0.2 04/08/2011 0956   NITRITE NEGATIVE 07/15/2021 1636   LEUKOCYTESUR NEGATIVE 07/15/2021 1636   Sepsis Labs: Invalid input(s): PROCALCITONIN, [place]  Microbiology: Recent Results (from the past 240 hour(s))  Blood Culture (routine x 2)     Status: None (Preliminary result)   Collection Time: 07/15/21  5:01 PM   Specimen: BLOOD  Result Value Ref Range Status   Specimen Description   Final    BLOOD RIGHT ANTECUBITAL Performed at [facility] [address], [place], [place] [address]    Special Requests   Final    BOTTLES DRAWN AEROBIC AND ANAEROBIC Blood Culture adequate volume Performed at [facility] [address] [place], [place] [address]    Culture   Final    NO GROWTH 2 DAYS Performed at [facility] [address], [place], [place] [address]    Report Status PENDING  Incomplete  Resp Panel by RT-PCR (Flu A&B, Covid) Nasopharyngeal Swab     Status: None   Collection Time:  07/15/21  5:17 PM   Specimen: Nasopharyngeal Swab; Nasopharyngeal(NP) swabs in vial transport medium  Result Value Ref Range Status   SARS Coronavirus 2 by RT PCR NEGATIVE NEGATIVE Final    Comment: (NOTE) SARS-CoV-2 target nucleic acids are NOT DETECTED.  The SARS-CoV-2 RNA is generally detectable in upper respiratory specimens during the acute phase of infection. The lowest concentration of SARS-CoV-2 viral copies this assay can detect is 138 copies/mL. A negative result does not preclude SARS-Cov-2 infection and should not be used as the sole basis for treatment or other patient management decisions. A negative result may occur with  improper specimen collection/handling, submission of specimen other than nasopharyngeal swab, presence of viral mutation(s) within the areas targeted by this assay, and inadequate number of viral copies(<138 copies/mL). A negative result must be combined with clinical observations, patient history, and epidemiological information. The expected result is Negative.  Fact Sheet for Patients:  EntrepreneurPulse.com.au  Fact Sheet for Healthcare Providers:  IncredibleEmployment.be  This test is no t yet approved or cleared by the [place] FDA and  has been authorized for detection and/or diagnosis of SARS-CoV-2 by FDA under an Emergency Use Authorization (EUA). This EUA will remain  in effect (meaning this test can be used) for the duration of the COVID-19 declaration under Section 564(b)(1) of the Act, 21 U.S.C.section 360bbb-3(b)(1), unless the authorization is terminated  or revoked sooner.       Influenza A by PCR NEGATIVE NEGATIVE Final   Influenza B by PCR NEGATIVE NEGATIVE Final    Comment: (NOTE) The Xpert Xpress SARS-CoV-2/FLU/RSV plus assay is intended as an aid in the diagnosis of influenza from Nasopharyngeal swab specimens and  should not be used as a sole basis for treatment. Nasal washings  and aspirates are unacceptable for Xpert Xpress SARS-CoV-2/FLU/RSV testing.  Fact Sheet for Patients: EntrepreneurPulse.com.au  Fact Sheet for Healthcare Providers: IncredibleEmployment.be  This test is not yet approved or cleared by the [place] FDA and has been authorized for detection and/or diagnosis of SARS-CoV-2 by FDA under an Emergency Use Authorization (EUA). This EUA will remain in effect (meaning this test can be used) for the duration of the COVID-19 declaration under Section 564(b)(1) of the Act, 21 U.S.C. section 360bbb-3(b)(1), unless the authorization is terminated or revoked.  Performed at [facility], [place] [address] [place], [place] [address]   Blood Culture (routine x 2)     Status: None (Preliminary result)   Collection Time: 07/15/21  5:19 PM   Specimen: BLOOD  Result Value Ref Range Status   Specimen Description   Final    BLOOD RIGHT WRIST Performed at [facility] [address], [place], [place] [address]    Special Requests   Final    BOTTLES DRAWN AEROBIC AND ANAEROBIC Blood Culture adequate volume Performed at [facility] [address] [place], [place] [address]    Culture   Final    NO GROWTH 2 DAYS Performed at [facility] [address], [place], [place] [address]    Report Status PENDING  Incomplete  Respiratory (~20 pathogens) panel by PCR     Status: None   Collection Time: 07/15/21 10:25 PM   Specimen: Nasopharyngeal Swab; Respiratory  Result Value Ref Range Status   Adenovirus NOT DETECTED NOT DETECTED Final   Coronavirus 229E NOT DETECTED NOT DETECTED Final    Comment: (NOTE) The Coronavirus on the Respiratory Panel, DOES NOT test for the novel  Coronavirus (2019 nCoV)    Coronavirus HKU1 NOT DETECTED NOT DETECTED Final   Coronavirus NL63 NOT DETECTED NOT DETECTED Final   Coronavirus OC43 NOT DETECTED NOT DETECTED Final    Metapneumovirus NOT DETECTED NOT DETECTED Final   Rhinovirus / Enterovirus NOT DETECTED NOT DETECTED Final   Influenza A NOT DETECTED NOT DETECTED Final   Influenza B NOT DETECTED NOT DETECTED Final   Parainfluenza Virus 1 NOT DETECTED NOT DETECTED Final   Parainfluenza Virus 2 NOT DETECTED NOT DETECTED Final   Parainfluenza Virus 3 NOT DETECTED NOT DETECTED Final   Parainfluenza Virus 4 NOT DETECTED NOT DETECTED Final   Respiratory Syncytial Virus NOT DETECTED NOT DETECTED Final   Bordetella pertussis NOT DETECTED NOT DETECTED Final   Bordetella Parapertussis NOT DETECTED NOT DETECTED Final   Chlamydophila pneumoniae NOT DETECTED NOT DETECTED Final   Mycoplasma pneumoniae NOT DETECTED NOT DETECTED Final    Comment: Performed at [facility], [place]. [address] [place], [place] [address]    Radiology Studies: DG Chest [place] 1 View  Result Date: 07/17/2021 CLINICAL DATA:  75 year old male with increasing shortness of breath. EXAM: PORTABLE CHEST 1 VIEW COMPARISON:  Chest x-ray 07/15/2021. FINDINGS: Widespread areas of interstitial prominence and patchy asymmetrically distributed airspace disease is again noted in the lungs bilaterally (left greater than right). Compared to the prior examination, there is worsening aeration in the right upper lobe. No definite pleural effusions. No pneumothorax. No evidence of pulmonary edema. Heart size is normal. Upper mediastinal contours are within normal limits. Atherosclerotic calcifications are noted in the thoracic aorta. Orthopedic rod and screw fixation hardware noted throughout the thoracolumbar spine. IMPRESSION: 1. Worsening multilobar bilateral pneumonia,  as above. 2. Aortic atherosclerosis. Electronically Signed   By: Vinnie Langton M.D.   On: 07/17/2021 10:15     CRITICAL CARE Performed by: [facility]   Total critical care time: 45 minutes  Critical care time was exclusive of separately billable procedures and treating other  patients.  Critical care was necessary to treat or prevent imminent or life-threatening deterioration.  Critical care was time spent personally by me on the following activities: development of treatment plan with patient and/or surrogate as well as nursing, discussions with consultants, evaluation of patient's response to treatment, examination of patient, obtaining history from patient or surrogate, ordering and performing treatments and interventions, ordering and review of laboratory studies, ordering and review of radiographic studies, pulse oximetry and re-evaluation of patient's condition.    Joangel Vanosdol T. [place]  If 7PM-7AM, please contact night-coverage [email or web address] 07/17/2021, 1:41 PM

## 2021-07-17 NOTE — Progress Notes (Signed)
Patient stood at bedside to use urinal and remove pants, also attempted to use BSC for BM. Pt very unsteady, required 2 assist. O2 Sats dropped to 70's on 8L HFNC. Increased to 15L HFNC and pt remained upper 80's/lower 90's while OOB. Pt now back on 8L HFNC after recovering in bed. Condom cath placed for comfort and safety. Will continue to monitor.

## 2021-07-17 NOTE — Progress Notes (Signed)
ANTICOAGULATION CONSULT NOTE - Follow Up Consult  Pharmacy Consult for Heparin Indication: pulmonary embolus  Allergies  Allergen Reactions   Daptomycin     Drug induced pneumonia     Patient Measurements: Height: 5\' 10"  (177.8 cm) Weight: 77.1 kg (170 lb) IBW/kg (Calculated) : 73 Heparin Dosing Weight: 77 kg  Vital Signs: Temp: 97.2 F (36.2 C) (12/31 1331) Temp Source: Oral (12/31 1331) BP: 179/76 (12/31 1331) Pulse Rate: 97 (12/31 1331)  Labs: Recent Labs    07/15/21 1715 07/15/21 2225 07/16/21 0449 07/16/21 1000 07/16/21 1842 07/17/21 0508 07/17/21 1107 07/17/21 1235 07/17/21 1250  HGB 12.3*  --  11.7*  --   --  11.4*  --   --   --   HCT 36.2*  --  33.8*  --   --  34.3*  --   --   --   PLT 338  --  229  --   --  274  --   --   --   APTT 28  --   --  33  --   --   --   --   --   LABPROT 16.3*  --   --   --   --   --   --   --   --   INR 1.3*  --   --   --   --   --   --   --   --   HEPARINUNFRC  --    < >  --  0.22* 0.25* 0.26*  --   --  0.46  CREATININE 0.91  --  0.52*  --   --  0.68  --   --   --   CKTOTAL  --   --   --   --   --  60  --   --   --   TROPONINIHS  --    < > 198*  --   --   --  70* 70*  --    < > = values in this interval not displayed.     Estimated Creatinine Clearance: 79.8 mL/min (by C-G formula based on SCr of 0.68 mg/dL).   Medications:  Scheduled:   arformoterol  15 mcg Nebulization BID   And   umeclidinium bromide  1 puff Inhalation Daily   atorvastatin  10 mg Oral Daily   budesonide (PULMICORT) nebulizer solution  0.5 mg Nebulization BID   digoxin  0.125 mg Oral Daily   guaiFENesin  600 mg Oral BID   insulin aspart  0-20 Units Subcutaneous TID WC   insulin aspart  0-5 Units Subcutaneous QHS   insulin aspart  6 Units Subcutaneous TID WC   [START ON 07/18/2021] insulin glargine-yfgn  20 Units Subcutaneous Daily   mouth rinse  15 mL Mouth Rinse BID   methylPREDNISolone (SOLU-MEDROL) injection  80 mg Intravenous Q12H    pantoprazole  40 mg Oral Daily   pregabalin  75 mg Oral BID   tamsulosin  0.8 mg Oral Daily   venlafaxine XR  150 mg Oral Q breakfast   Infusions:   azithromycin Stopped (07/16/21 2248)   ceFEPime (MAXIPIME) IV 2 g (07/17/21 1149)   heparin 1,700 Units/hr (07/17/21 1221)   vancomycin 5 mL/hr at 07/17/21 0600    Assessment: 76 yo male presents with SOB and cough, has had for the last 3 weeks but worse the last couple days.  History of Afib and DVT/PE, on xarelto.  Pharmacy consulted to dose heparin drip for PE.   Xarelto last dose reported on 12/28, but baseline HL 0.11 (12/30), and not elevated at baseline as would be expected on Xarelto.  OK to dose using heparin level.   07/17/2021: Heparin level 0.46, subtherapeutic on heparin 1700 units/hr CBC stable  No bleeding or complications reported  Goal of Therapy:  Heparin level 0.3-0.7 units/ml Monitor platelets by anticoagulation protocol: Yes   Plan:  Continue heparin IV infusion at 1700 units/hr Daily heparin level and CBC Continue to monitor H&H and platelets  Ulice Dash, PharmD  07/17/2021, 2:23 PM

## 2021-07-17 NOTE — Plan of Care (Addendum)
Patient remains on HFNC. Tenuous respiratory status.  0045 hrs: Secure chat sent to Lovey Newcomer, NP regarding patient's elevated BP. No new orders at this time.   Problem: Education: Goal: Knowledge of disease or condition will improve Outcome: Progressing Goal: Knowledge of the prescribed therapeutic regimen will improve Outcome: Progressing Goal: Individualized Educational Video(s) Outcome: Progressing   Problem: Activity: Goal: Ability to tolerate increased activity will improve Outcome: Progressing Goal: Will verbalize the importance of balancing activity with adequate rest periods Outcome: Progressing   Problem: Respiratory: Goal: Ability to maintain a clear airway will improve Outcome: Progressing Goal: Levels of oxygenation will improve Outcome: Progressing Goal: Ability to maintain adequate ventilation will improve Outcome: Progressing   Problem: Activity: Goal: Ability to tolerate increased activity will improve Outcome: Progressing   Problem: Clinical Measurements: Goal: Ability to maintain a body temperature in the normal range will improve Outcome: Progressing   Problem: Respiratory: Goal: Ability to maintain adequate ventilation will improve Outcome: Progressing Goal: Ability to maintain a clear airway will improve Outcome: Progressing   Problem: Education: Goal: Knowledge of General Education information will improve Description: Including pain rating scale, medication(s)/side effects and non-pharmacologic comfort measures Outcome: Progressing   Problem: Health Behavior/Discharge Planning: Goal: Ability to manage health-related needs will improve Outcome: Progressing   Problem: Clinical Measurements: Goal: Ability to maintain clinical measurements within normal limits will improve Outcome: Progressing Goal: Will remain free from infection Outcome: Progressing Goal: Diagnostic test results will improve Outcome: Progressing Goal: Respiratory  complications will improve Outcome: Progressing Goal: Cardiovascular complication will be avoided Outcome: Progressing   Problem: Activity: Goal: Risk for activity intolerance will decrease Outcome: Progressing   Problem: Nutrition: Goal: Adequate nutrition will be maintained Outcome: Progressing   Problem: Coping: Goal: Level of anxiety will decrease Outcome: Progressing   Problem: Elimination: Goal: Will not experience complications related to bowel motility Outcome: Progressing Goal: Will not experience complications related to urinary retention Outcome: Progressing   Problem: Pain Managment: Goal: General experience of comfort will improve Outcome: Progressing   Problem: Safety: Goal: Ability to remain free from injury will improve Outcome: Progressing   Problem: Skin Integrity: Goal: Risk for impaired skin integrity will decrease Outcome: Progressing

## 2021-07-17 NOTE — Progress Notes (Signed)
Upon bedside report around 0730, patient alert, not in distress but c/o SOB. O2 Sats lower 90's on 8L HFNC. Per previous RN, RR was notified previous shift d/t hypoxia in 60's. Pt's wife at bedside now and updated on changes. MD Cyndia Skeeters also made aware at bedside. Pt briefly had to be increased to 10L HFNC this shift, but has been weaned back down to 8L HFNC at this time. Will continue to monitor closely.

## 2021-07-17 NOTE — Plan of Care (Signed)
  Problem: Education: Goal: Knowledge of disease or condition will improve Outcome: Progressing   

## 2021-07-17 NOTE — Progress Notes (Signed)
ANTICOAGULATION CONSULT NOTE - Follow Up Consult  Pharmacy Consult for Heparin Indication: pulmonary embolus  Allergies  Allergen Reactions   Daptomycin     Drug induced pneumonia     Patient Measurements: Height: 5\' 10"  (177.8 cm) Weight: 77.1 kg (170 lb) IBW/kg (Calculated) : 73 Heparin Dosing Weight: 77 kg  Vital Signs: Temp: 98.1 F (36.7 C) (12/31 0431) Temp Source: Oral (12/31 0431) BP: 147/92 (12/31 0600) Pulse Rate: 90 (12/31 0600)  Labs: Recent Labs    07/15/21 1715 07/15/21 1715 07/15/21 2225 07/16/21 0118 07/16/21 0449 07/16/21 1000 07/16/21 1842 07/17/21 0508  HGB 12.3*  --   --   --  11.7*  --   --  11.4*  HCT 36.2*  --   --   --  33.8*  --   --  34.3*  PLT 338  --   --   --  229  --   --  274  APTT 28  --   --   --   --  33  --   --   LABPROT 16.3*  --   --   --   --   --   --   --   INR 1.3*  --   --   --   --   --   --   --   HEPARINUNFRC  --    < >  --  0.11*  --  0.22* 0.25* 0.26*  CREATININE 0.91  --   --   --  0.52*  --   --   --   TROPONINIHS  --   --  11 167* 198*  --   --   --    < > = values in this interval not displayed.     Estimated Creatinine Clearance: 79.8 mL/min (A) (by C-G formula based on SCr of 0.52 mg/dL (L)).   Medications:  Scheduled:   arformoterol  15 mcg Nebulization BID   And   umeclidinium bromide  1 puff Inhalation Daily   atorvastatin  10 mg Oral Daily   budesonide (PULMICORT) nebulizer solution  0.5 mg Nebulization BID   guaiFENesin  600 mg Oral BID   insulin aspart  0-20 Units Subcutaneous TID WC   insulin aspart  0-5 Units Subcutaneous QHS   insulin aspart  4 Units Subcutaneous TID WC   insulin glargine-yfgn  15 Units Subcutaneous Daily   mouth rinse  15 mL Mouth Rinse BID   methylPREDNISolone (SOLU-MEDROL) injection  80 mg Intravenous Q12H   pantoprazole  40 mg Oral Daily   tamsulosin  0.8 mg Oral Daily   venlafaxine XR  150 mg Oral Q breakfast   Infusions:   azithromycin Stopped (07/16/21 2248)    ceFEPime (MAXIPIME) IV Stopped (07/17/21 0455)   heparin 1,550 Units/hr (07/17/21 0600)   vancomycin 5 mL/hr at 07/17/21 0600    Assessment: 75 yo male presents with SOB and cough, has had for the last 3 weeks but worse the last couple days.  History of Afib and DVT/PE, on xarelto.  Pharmacy consulted to dose heparin drip for PE.   Xarelto last dose reported on 12/28, but baseline HL 0.11 (12/30), and not elevated at baseline as would be expected on Xarelto.  OK to dose using heparin level.   07/17/2021: Heparin level 0.26, subtherapeutic on heparin 1550 units/hr CBC stable  No bleeding or complications reported by RN.    Goal of Therapy:  Heparin level 0.3-0.7 units/ml Monitor platelets  by anticoagulation protocol: Yes   Plan:  Increase to heparin IV infusion at 1700 units/hr Heparin level 8 hours after rate change Daily heparin level and CBC Continue to monitor H&H and platelets   Dolly Rias RPh 07/17/2021, 6:13 AM

## 2021-07-17 NOTE — Progress Notes (Signed)
MD Cyndia Skeeters made aware of patient's CBG 405. Gave order to give 20 units SSI Novolog + 6 units Novolog with meals. Will continue to monitor.

## 2021-07-18 ENCOUNTER — Inpatient Hospital Stay (HOSPITAL_COMMUNITY): Payer: Medicare Other

## 2021-07-18 LAB — LACTIC ACID, PLASMA: Lactic Acid, Venous: 1 mmol/L (ref 0.5–1.9)

## 2021-07-18 LAB — CBC
HCT: 33.6 % — ABNORMAL LOW (ref 39.0–52.0)
Hemoglobin: 11 g/dL — ABNORMAL LOW (ref 13.0–17.0)
MCH: 30 pg (ref 26.0–34.0)
MCHC: 32.7 g/dL (ref 30.0–36.0)
MCV: 91.6 fL (ref 80.0–100.0)
Platelets: 300 10*3/uL (ref 150–400)
RBC: 3.67 MIL/uL — ABNORMAL LOW (ref 4.22–5.81)
RDW: 14.8 % (ref 11.5–15.5)
WBC: 15 10*3/uL — ABNORMAL HIGH (ref 4.0–10.5)
nRBC: 0 % (ref 0.0–0.2)

## 2021-07-18 LAB — PROCALCITONIN: Procalcitonin: <0.1 ng/mL

## 2021-07-18 LAB — GLUCOSE, CAPILLARY
Glucose-Capillary: 151 mg/dL — ABNORMAL HIGH (ref 70–99)
Glucose-Capillary: 234 mg/dL — ABNORMAL HIGH (ref 70–99)
Glucose-Capillary: 242 mg/dL — ABNORMAL HIGH (ref 70–99)
Glucose-Capillary: 347 mg/dL — ABNORMAL HIGH (ref 70–99)
Glucose-Capillary: 390 mg/dL — ABNORMAL HIGH (ref 70–99)

## 2021-07-18 LAB — HEPARIN LEVEL (UNFRACTIONATED): Heparin Unfractionated: 0.59 IU/mL (ref 0.30–0.70)

## 2021-07-18 LAB — LUPUS ANTICOAGULANT PANEL
DRVVT: 39.3 s (ref 0.0–47.0)
PTT Lupus Anticoagulant: 43.2 s (ref 0.0–51.9)

## 2021-07-18 MED ORDER — LORAZEPAM 0.5 MG PO TABS
0.5000 mg | ORAL_TABLET | Freq: Two times a day (BID) | ORAL | Status: DC | PRN
  Administered 2021-07-18 – 2021-07-22 (×5): 0.5 mg via ORAL
  Filled 2021-07-18 (×5): qty 1

## 2021-07-18 MED ORDER — INSULIN GLARGINE-YFGN 100 UNIT/ML ~~LOC~~ SOLN
25.0000 [IU] | Freq: Every day | SUBCUTANEOUS | Status: DC
  Administered 2021-07-19 – 2021-07-21 (×3): 25 [IU] via SUBCUTANEOUS
  Filled 2021-07-18 (×3): qty 0.25

## 2021-07-18 MED ORDER — INSULIN ASPART 100 UNIT/ML IJ SOLN
10.0000 [IU] | Freq: Three times a day (TID) | INTRAMUSCULAR | Status: DC
  Administered 2021-07-18 – 2021-07-24 (×16): 10 [IU] via SUBCUTANEOUS

## 2021-07-18 NOTE — Progress Notes (Signed)
PROGRESS NOTE  Luis Brown XBD:[id number] DOB: 10/12/1945   PCP: Mackie Pai, PA-C  Patient is from: Home.  Lives with his wife.  Independently ambulates at baseline.  DOA: 07/15/2021 LOS: 3  Chief complaints:  Chief Complaint  Patient presents with   Low O2 Sats     Brief Narrative / Interim history: 76 year old M with PMH of COPD not on oxygen, DM-2, daptomycin induced pneumonitis, A. fib/RUE DVT in 11/2020 on Xarelto, HLD, BPH, DDD, colonic polyps, anxiety and depression presenting with progressive shortness of breath for 1 week, hypoxemia, chest tightness and cough, and admitted for severe sepsis and acute hypoxic respiratory failure in the setting of CAP, left> right, and right-sided segmental and subsegmental PE with small to moderate blood clot burden.  He was hypoxic to 58% on RA while in ED.  Cultures obtained.  He was a started on vancomycin, cefepime, azithromycin, steroid and IV heparin.  PCCM consulted.    HRCT compatible with severe multilobar bilateral pneumonia and diffuse bronchial wall thickening with mild centrilobular and paraseptal emphysema.  Autoimmune and pneumonitis labs pending.  Continues to require up to 8L by HFNC.  Remains on IV cefepime, azithromycin, steroid, IV heparin and nebulizers.    Subjective: Seen and examined earlier this morning.  No major events overnight of this morning.  Feels better from breathing standpoint although he continues to require 8 to 9 L by HFNC.  Did not have good sleep partly due to steroid.  He denies chest pain, GI or UTI symptoms.  Objective: Vitals:   07/18/21 0510 07/18/21 0600 07/18/21 0910 07/18/21 1030  BP:      Pulse: (!) 54 65  99  Resp: 20 (!) 23    Temp:      TempSrc:      SpO2: 95% 93% 95%   Weight:      Height:        Examination:  GENERAL: No apparent distress.  Nontoxic. HEENT: MMM.  Vision and hearing grossly intact.  NECK: Supple.  No apparent JVD.  RESP: 95% on 8 L by HFNC.  No IWOB.   Crackles over LLL CVS:  RRR. Heart sounds normal.  ABD/GI/GU: BS+. Abd soft, NTND.  MSK/EXT:  Moves extremities. No apparent deformity. No edema.  SKIN: no apparent skin lesion or wound NEURO: Awake and alert. Oriented appropriately.  No apparent focal neuro deficit. PSYCH: A little anxious.  Normal affect.  Procedures:  None  Microbiology summarized: [id number], influenza PCR and full RVP panel negative. MRSA PCR negative. Blood cultures NGTD.  Assessment & Plan: Acute hypoxic respiratory failure due to pneumonia, PE and COPD.  Desaturated to 56% on RA while in ED. improved and weaned to 2 L but gotten worse again.  Now requiring 8 to 9 L.  Some concern about ILD.  HRCT consistent with multilobar pneumonia and COPD.  Appears euvolemic on exam.  TTE reassuring. -PCCM following -Manage pneumonia, PE and COPD as below -Incentive spirometry, OOB -Continue weaning off oxygen -Follow-up autoimmune and hypersensitivity labs  Severe sepsis due to community-acquired pneumonia: POA.   CXR, CTA chest and HRCT showed bilateral pneumonia, left> right, worse on repeat CXR.  Culture data as above.  Pro-Cal 0.21>>> 0.1.  -Discontinued IV vancomycin-MRSA PCR negative. -Continue cefepime and azithromycin -Incentive spirometry/OOB -Mucolytic's and antitussive  Acute segmental and subsegmental right-sided acute pulmonary embolism-CTA with RUL and RLL PE History of RUE DVT in 11/2020 -Reports good compliance with Xarelto which makes him Xarelto failure. -Lower extremity US  negative for DVT. -TTE without significant finding. -Continue IV heparin until his respiratory status improves -Discussed with hematology, Dr. Lindi Adie who recommended Pradaxa and lupus anticoagulant panel  COPD exacerbation-it seems he was on prednisone taper since 07/08/2021 -Continue Solu-Medrol 80 mg twice daily. -May need prolonged steroid taper on discharge. -Continue Brovana, Pulmicort and Incruse -Xopenex and ipratropium  every 6 hours as needed  Elevated troponin: Likely demand ischemia in the setting of respiratory failure, sepsis and respiratory failure.  TTE reassuring.  Patient without chest pain.  Paroxysmal A. fib: On metoprolol, digoxin and Xarelto.  Digoxin subtherapeutic at 0.4. -Continue home digoxin -Resume home metoprolol once respiratory status improves -Anticoagulation as above  Uncontrolled NIDDM-2 with hyperglycemia: A1c 7.4%.  Hyperglycemia likely due to steroid Recent Labs  Lab 07/17/21 1635 07/17/21 2103 07/18/21 0011 07/18/21 0748 07/18/21 1104  GLUCAP 405* 346* 242* 234* 390*  -Continue SSI-resistant scale -Increase NovoLog from 6 to 10 units 3 times daily with meals -Increase basal insulin from 20 to 25 units -Further adjustment as appropriate  Hyponatremia: Na 131, ajusts to 134 for hypoglycemia.  Urine sodium slightly elevated to 35.  TSH within normal.  Improving with IV fluid which argues against SIADH. -Recheck in the morning  DDD/chronic back pain -Continue home Norco, Lyrica and Zanaflex  BPH without LUTS -Continue home Flomax.  Anxiety/depression: A little anxious likely from steroid. 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  No eosinophilia on CBC -Currently on a steroid for COPD exacerbation   Body mass index is 24.39 kg/m.         DVT prophylaxis:    Code Status: Full code Family Communication: Updated patient's wife at bedside on 12/31.  None at bedside today.. Level of care: Progressive Status is: Inpatient  Remains inpatient appropriate because: Due to severe sepsis requiring IV antibiotics, hypoxemia requiring significant oxygen, COPD exacerbation requiring IV Solu-Medrol and PE requiring further evaluation    Final disposition: Likely home once medically stable.  Consultants:  Pulmonology   Sch Meds:  Scheduled Meds:  arformoterol  15 mcg Nebulization BID   And   umeclidinium bromide   1 puff Inhalation Daily   atorvastatin  10 mg Oral Daily   budesonide (PULMICORT) nebulizer solution  0.5 mg Nebulization BID   digoxin  0.125 mg Oral Daily   guaiFENesin  600 mg Oral BID   insulin aspart  0-20 Units Subcutaneous TID WC   insulin aspart  0-5 Units Subcutaneous QHS   insulin aspart  6 Units Subcutaneous TID WC   insulin glargine-yfgn  20 Units Subcutaneous Daily   mouth rinse  15 mL Mouth Rinse BID   methylPREDNISolone (SOLU-MEDROL) injection  80 mg Intravenous Q12H   pantoprazole  40 mg Oral Daily   pregabalin  75 mg Oral BID   tamsulosin  0.8 mg Oral Daily   venlafaxine XR  150 mg Oral Q breakfast   Continuous Infusions:  sodium chloride Stopped (07/18/21 0537)   azithromycin Stopped (07/17/21 2106)   ceFEPime (MAXIPIME) IV 2 g (07/18/21 1236)   heparin 1,700 Units/hr (07/18/21 0600)   PRN Meds:.sodium chloride, acetaminophen **OR** acetaminophen, HYDROcodone-acetaminophen, ipratropium, levalbuterol, melatonin, oxyCODONE-acetaminophen, tiZANidine  Antimicrobials: Anti-infectives (From admission, onward)    Start     Dose/Rate Route Frequency Ordered Stop   07/16/21 2200  vancomycin (VANCOREADY) IVPB 1750 mg/350 mL  Status:  Discontinued        1,750 mg 175 mL/hr over 120 Minutes Intravenous Every 24 hours 07/16/21 0242 07/17/21  1656   07/16/21 2000  azithromycin (ZITHROMAX) 500 mg in sodium chloride 0.9 % 250 mL IVPB        500 mg 250 mL/hr over 60 Minutes Intravenous Every 24 hours 07/16/21 0436     07/16/21 1930  cefTRIAXone (ROCEPHIN) 1 g in sodium chloride 0.9 % 100 mL IVPB  Status:  Discontinued        1 g 200 mL/hr over 30 Minutes Intravenous Every 24 hours 07/16/21 0436 07/16/21 0438   07/16/21 0400  ceFEPIme (MAXIPIME) 2 g in sodium chloride 0.9 % 100 mL IVPB        2 g 200 mL/hr over 30 Minutes Intravenous Every 8 hours 07/16/21 0200     07/16/21 0215  vancomycin (VANCOCIN) IVPB 1000 mg/200 mL premix        1,000 mg 200 mL/hr over 60 Minutes  Intravenous  Once 07/16/21 0200 07/16/21 0441   07/15/21 1915  cefTRIAXone (ROCEPHIN) 1 g in sodium chloride 0.9 % 100 mL IVPB        1 g 200 mL/hr over 30 Minutes Intravenous  Once 07/15/21 1912 07/15/21 2140   07/15/21 1915  azithromycin (ZITHROMAX) 500 mg in sodium chloride 0.9 % 250 mL IVPB        500 mg 250 mL/hr over 60 Minutes Intravenous  Once 07/15/21 1912 07/15/21 2139        I have personally reviewed the following labs and images: CBC: Recent Labs  Lab 07/15/21 1715 07/16/21 0449 07/17/21 0508 07/18/21 0511  WBC 14.9* 12.9* 13.8* 15.0*  NEUTROABS 13.2* 12.0*  --   --   HGB 12.3* 11.7* 11.4* 11.0*  HCT 36.2* 33.8* 34.3* 33.6*  MCV 89.6 89.9 91.5 91.6  PLT 338 229 274 300   BMP &GFR Recent Labs  Lab 07/15/21 1715 07/16/21 0449 07/17/21 0508  NA 127* 131* 135  K 4.2 4.4 4.4  CL 93* 95* 100  CO2 23 25 23   GLUCOSE 482* 212* 286*  BUN 25* 19 27*  CREATININE 0.91 0.52* 0.68  CALCIUM 8.7* 8.4* 8.5*  MG  --  1.8 2.3  PHOS  --  3.1 3.1   Estimated Creatinine Clearance: 79.8 mL/min (by C-G formula based on SCr of 0.68 mg/dL). Liver & Pancreas: Recent Labs  Lab 07/15/21 1715 07/16/21 0449 07/17/21 0508  AST 22 21  --   ALT 17 18  --   ALKPHOS 157* 143*  --   BILITOT 1.3* 1.5*  --   PROT 6.4* 5.9*  --   ALBUMIN 3.4* 2.9* 2.8*   No results for input(s): LIPASE, AMYLASE in the last 168 hours. No results for input(s): AMMONIA in the last 168 hours. Diabetic: Recent Labs    07/15/21 2225  HGBA1C 7.4*   Recent Labs  Lab 07/17/21 1635 07/17/21 2103 07/18/21 0011 07/18/21 0748 07/18/21 1104  GLUCAP 405* 346* 242* 234* 390*   Cardiac Enzymes: Recent Labs  Lab 07/17/21 0508 07/17/21 1500  CKTOTAL 60 87  CKMB  --  3.1   No results for input(s): PROBNP in the last 8760 hours. Coagulation Profile: Recent Labs  Lab 07/15/21 1715  INR 1.3*   Thyroid Function Tests: Recent Labs    07/16/21 1000  TSH 1.816   Lipid Profile: No results for  input(s): CHOL, HDL, LDLCALC, TRIG, CHOLHDL, LDLDIRECT in the last 72 hours. Anemia Panel: No results for input(s): VITAMINB12, FOLATE, FERRITIN, TIBC, IRON, RETICCTPCT in the last 72 hours. Urine analysis:    Component Value Date/Time  COLORURINE YELLOW 07/15/2021 [place] 07/15/2021 1636   LABSPEC 1.024 07/15/2021 1636   PHURINE 6.0 07/15/2021 1636   GLUCOSEU >=500 (A) 07/15/2021 1636   HGBUR SMALL (A) 07/15/2021 1636   BILIRUBINUR NEGATIVE 07/15/2021 1636   BILIRUBINUR negative 10/08/2019 1430   KETONESUR NEGATIVE 07/15/2021 1636   PROTEINUR NEGATIVE 07/15/2021 1636   UROBILINOGEN negative (A) 10/08/2019 1430   UROBILINOGEN 0.2 04/08/2011 0956   NITRITE NEGATIVE 07/15/2021 1636   LEUKOCYTESUR NEGATIVE 07/15/2021 1636   Sepsis Labs: Invalid input(s): PROCALCITONIN, [place]  Microbiology: Recent Results (from the past 240 hour(s))  Blood Culture (routine x 2)     Status: None (Preliminary result)   Collection Time: 07/15/21  5:01 PM   Specimen: BLOOD  Result Value Ref Range Status   Specimen Description   Final    BLOOD RIGHT ANTECUBITAL Performed at [facility] [address] [place], [place] [address]    Special Requests   Final    BOTTLES DRAWN AEROBIC AND ANAEROBIC Blood Culture adequate volume Performed at [facility] [address] [place], [place] [address]    Culture   Final    NO GROWTH 3 DAYS Performed at [facility], [place] [address] [place], [place] [address]    Report Status PENDING  Incomplete  Resp Panel by RT-PCR (Flu A&B, Covid) Nasopharyngeal Swab     Status: None   Collection Time: 07/15/21  5:17 PM   Specimen: Nasopharyngeal Swab; Nasopharyngeal(NP) swabs in vial transport medium  Result Value Ref Range Status   SARS Coronavirus 2 by RT PCR NEGATIVE NEGATIVE Final    Comment: (NOTE) SARS-CoV-2 target nucleic acids are NOT DETECTED.  The SARS-CoV-2 RNA is generally detectable  in upper respiratory specimens during the acute phase of infection. The lowest concentration of SARS-CoV-2 viral copies this assay can detect is 138 copies/mL. A negative result does not preclude SARS-Cov-2 infection and should not be used as the sole basis for treatment or other patient management decisions. A negative result may occur with  improper specimen collection/handling, submission of specimen other than nasopharyngeal swab, presence of viral mutation(s) within the areas targeted by this assay, and inadequate number of viral copies(<138 copies/mL). A negative result must be combined with clinical observations, patient history, and epidemiological information. The expected result is Negative.  Fact Sheet for Patients:  EntrepreneurPulse.com.au  Fact Sheet for Healthcare Providers:  IncredibleEmployment.be  This test is no t yet approved or cleared by the [place] FDA and  has been authorized for detection and/or diagnosis of SARS-CoV-2 by FDA under an Emergency Use Authorization (EUA). This EUA will remain  in effect (meaning this test can be used) for the duration of the COVID-19 declaration under Section 564(b)(1) of the Act, 21 U.S.C.section 360bbb-3(b)(1), unless the authorization is terminated  or revoked sooner.       Influenza A by PCR NEGATIVE NEGATIVE Final   Influenza B by PCR NEGATIVE NEGATIVE Final    Comment: (NOTE) The Xpert Xpress SARS-CoV-2/FLU/RSV plus assay is intended as an aid in the diagnosis of influenza from Nasopharyngeal swab specimens and should not be used as a sole basis for treatment. Nasal washings and aspirates are unacceptable for Xpert Xpress SARS-CoV-2/FLU/RSV testing.  Fact Sheet for Patients: EntrepreneurPulse.com.au  Fact Sheet for Healthcare Providers: IncredibleEmployment.be  This test is not yet approved or cleared by the [place] FDA and has  been authorized for detection and/or diagnosis of SARS-CoV-2 by FDA under an  Emergency Use Authorization (EUA). This EUA will remain in effect (meaning this test can be used) for the duration of the COVID-19 declaration under Section 564(b)(1) of the Act, 21 U.S.C. section 360bbb-3(b)(1), unless the authorization is terminated or revoked.  Performed at [facility], [place] [address] [place], [place] [address]   Blood Culture (routine x 2)     Status: None (Preliminary result)   Collection Time: 07/15/21  5:19 PM   Specimen: BLOOD  Result Value Ref Range Status   Specimen Description   Final    BLOOD RIGHT WRIST Performed at [facility] [address], [place], [place] [address]    Special Requests   Final    BOTTLES DRAWN AEROBIC AND ANAEROBIC Blood Culture adequate volume Performed at [facility] [address], [place], [place] [address]    Culture   Final    NO GROWTH 3 DAYS Performed at [facility], [place] [address], [place], [place] [address]    Report Status PENDING  Incomplete  Respiratory (~20 pathogens) panel by PCR     Status: None   Collection Time: 07/15/21 10:25 PM   Specimen: Nasopharyngeal Swab; Respiratory  Result Value Ref Range Status   Adenovirus NOT DETECTED NOT DETECTED Final   Coronavirus 229E NOT DETECTED NOT DETECTED Final    Comment: (NOTE) The Coronavirus on the Respiratory Panel, DOES NOT test for the novel  Coronavirus (2019 nCoV)    Coronavirus HKU1 NOT DETECTED NOT DETECTED Final   Coronavirus NL63 NOT DETECTED NOT DETECTED Final   Coronavirus OC43 NOT DETECTED NOT DETECTED Final   Metapneumovirus NOT DETECTED NOT DETECTED Final   Rhinovirus / Enterovirus NOT DETECTED NOT DETECTED Final   Influenza A NOT DETECTED NOT DETECTED Final   Influenza B NOT DETECTED NOT DETECTED Final   Parainfluenza Virus 1 NOT DETECTED NOT DETECTED Final   Parainfluenza Virus 2 NOT DETECTED NOT  DETECTED Final   Parainfluenza Virus 3 NOT DETECTED NOT DETECTED Final   Parainfluenza Virus 4 NOT DETECTED NOT DETECTED Final   Respiratory Syncytial Virus NOT DETECTED NOT DETECTED Final   Bordetella pertussis NOT DETECTED NOT DETECTED Final   Bordetella Parapertussis NOT DETECTED NOT DETECTED Final   Chlamydophila pneumoniae NOT DETECTED NOT DETECTED Final   Mycoplasma pneumoniae NOT DETECTED NOT DETECTED Final    Comment: Performed at [facility], [place]. [address], [place], [place] [address]  MRSA Next Gen by PCR, Nasal     Status: None   Collection Time: 07/17/21  2:40 PM   Specimen: Nasal Mucosa; Nasal Swab  Result Value Ref Range Status   MRSA by PCR Next Gen NOT DETECTED NOT DETECTED Final    Comment: (NOTE) The GeneXpert MRSA Assay (FDA approved for NASAL specimens only), is one component of a comprehensive MRSA colonization surveillance program. It is not intended to diagnose MRSA infection nor to guide or monitor treatment for MRSA infections. Test performance is not FDA approved in patients less than 37 years old. Performed at [facility], [place] [address] [place],  [address]     Radiology Studies: CT Chest High Resolution  Result Date: 07/18/2021 CLINICAL DATA:  76 year old male with clinical suspicion of interstitial lung disease. EXAM: CT CHEST WITHOUT CONTRAST TECHNIQUE: Multidetector CT imaging of the chest was performed following the standard protocol without intravenous contrast. High resolution imaging of the lungs, as well as inspiratory and expiratory imaging, was performed. COMPARISON:  Chest CTA 07/15/2021.  FINDINGS: Cardiovascular: Heart size is normal. There is no significant pericardial fluid, thickening or pericardial calcification. There is aortic atherosclerosis, as well as atherosclerosis of the great vessels of the mediastinum and the coronary arteries, including calcified atherosclerotic plaque in the left main, left anterior  descending and left circumflex coronary arteries. Mediastinum/Nodes: No pathologically enlarged mediastinal or hilar lymph nodes. Please note that accurate exclusion of hilar adenopathy is limited on noncontrast CT scans. Esophagus is unremarkable in appearance. No axillary lymphadenopathy. Lungs/Pleura: Patchy areas of ground-glass attenuation, septal thickening, thickening of the peribronchovascular interstitium and patchy peripheral predominant areas of airspace consolidation are noted in the lungs bilaterally (left-greater-than-right), most compatible with severe multilobar bilateral pneumonia. Near complete sparing of the right middle lobe is noted. Scattered areas of cylindrical bronchiectasis are noted. Inspiratory and expiratory imaging is unremarkable. Trace left pleural effusion. No definite suspicious appearing pulmonary nodules or masses are noted. Diffuse bronchial wall thickening with mild centrilobular and paraseptal emphysema. Upper Abdomen: Aortic atherosclerosis. Musculoskeletal: There are no aggressive appearing lytic or blastic lesions noted in the visualized portions of the skeleton. Posterior rod and screw fixation device in place throughout the thoracolumbar spine. IMPRESSION: 1. The appearance of the lungs is most compatible with severe multilobar bilateral pneumonia, as above. 2. Diffuse bronchial wall thickening with mild centrilobular and paraseptal emphysema; imaging findings suggestive of underlying COPD. 3. Aortic atherosclerosis, in addition to left main and 2 vessel coronary artery disease. Assessment for potential risk factor modification, dietary therapy or pharmacologic therapy may be warranted, if clinically indicated. Aortic Atherosclerosis (ICD10-I70.0) and Emphysema (ICD10-J43.9). Electronically Signed   By: Vinnie Langton M.D.   On: 07/18/2021 11:38       Maysel Mccolm T. [place]  If 7PM-7AM, please contact night-coverage [email or web address] 07/18/2021, 12:57 PM

## 2021-07-18 NOTE — Progress Notes (Signed)
ANTICOAGULATION CONSULT NOTE - Follow Up Consult  Pharmacy Consult for Heparin Indication: pulmonary embolus  Allergies  Allergen Reactions   Daptomycin     Drug induced pneumonia     Patient Measurements: Height: 5\' 10"  (177.8 cm) Weight: 77.1 kg (170 lb) IBW/kg (Calculated) : 73 Heparin Dosing Weight: 77 kg  Vital Signs: Temp: 98 F (36.7 C) (01/01 0400) Temp Source: Oral (01/01 0400) BP: 159/72 (01/01 0400) Pulse Rate: 65 (01/01 0600)  Labs: Recent Labs    07/15/21 1715 07/15/21 2225 07/16/21 0449 07/16/21 1000 07/16/21 1842 07/17/21 0508 07/17/21 1107 07/17/21 1235 07/17/21 1250 07/17/21 1500 07/18/21 0511  HGB 12.3*  --  11.7*  --   --  11.4*  --   --   --   --  11.0*  HCT 36.2*  --  33.8*  --   --  34.3*  --   --   --   --  33.6*  PLT 338  --  229  --   --  274  --   --   --   --  300  APTT 28  --   --  33  --   --   --   --   --   --   --   LABPROT 16.3*  --   --   --   --   --   --   --   --   --   --   INR 1.3*  --   --   --   --   --   --   --   --   --   --   HEPARINUNFRC  --    < >  --  0.22*   < > 0.26*  --   --  0.46  --  0.59  CREATININE 0.91  --  0.52*  --   --  0.68  --   --   --   --   --   CKTOTAL  --   --   --   --   --  60  --   --   --  87  --   CKMB  --   --   --   --   --   --   --   --   --  3.1  --   TROPONINIHS  --    < > 198*  --   --   --  70* 70*  --   --   --    < > = values in this interval not displayed.     Estimated Creatinine Clearance: 79.8 mL/min (by C-G formula based on SCr of 0.68 mg/dL).   Medications:  Scheduled:   arformoterol  15 mcg Nebulization BID   And   umeclidinium bromide  1 puff Inhalation Daily   atorvastatin  10 mg Oral Daily   budesonide (PULMICORT) nebulizer solution  0.5 mg Nebulization BID   digoxin  0.125 mg Oral Daily   guaiFENesin  600 mg Oral BID   insulin aspart  0-20 Units Subcutaneous TID WC   insulin aspart  0-5 Units Subcutaneous QHS   insulin aspart  6 Units Subcutaneous TID WC    insulin glargine-yfgn  20 Units Subcutaneous Daily   mouth rinse  15 mL Mouth Rinse BID   methylPREDNISolone (SOLU-MEDROL) injection  80 mg Intravenous Q12H   pantoprazole  40 mg Oral Daily   pregabalin  75 mg  Oral BID   tamsulosin  0.8 mg Oral Daily   venlafaxine XR  150 mg Oral Q breakfast   Infusions:   sodium chloride Stopped (07/18/21 0537)   azithromycin Stopped (07/17/21 2106)   ceFEPime (MAXIPIME) IV Stopped (07/18/21 0443)   heparin 1,700 Units/hr (07/18/21 0600)    Assessment: 76 yo male presents with SOB and cough, has had for the last 3 weeks but worse the last couple days.  History of Afib and DVT/PE, on xarelto.  Pharmacy consulted to dose heparin drip for PE.   Xarelto last dose reported on 12/28, but baseline HL 0.11 (12/30), and not elevated at baseline as would be expected on Xarelto.  OK to dose using heparin level.   07/18/2021: Heparin level 0.59, therapeutic on heparin 1700 units/hr CBC stable  No bleeding or complications reported  Goal of Therapy:  Heparin level 0.3-0.7 units/ml Monitor platelets by anticoagulation protocol: Yes   Plan:  Continue heparin IV infusion at 1700 units/hr Daily heparin level and CBC Continue to monitor H&H and platelets  Peggyann Juba, PharmD, BCPS Pharmacy: [phone] 07/18/2021, 6:24 AM

## 2021-07-18 NOTE — Consult Note (Signed)
NAME:  Luis Brown, MRN:  [id number], DOB:  1945-11-11, LOS: 3 ADMISSION DATE:  07/15/2021, CONSULTATION DATE:  D12/31/22 REFERRING MD:  Dr Cyndia Skeeters, CHIEF COMPLAINT:  resp failure   BRIEF   This is a 76 year old male with poorly controlled diabetes, hemoglobin A1c 7.4, chronic opioid use for back pain, BPH on Flomax, anxiety and depression, paroxysmal A. fib on metoprolol digoxin and Xarelto .  He had long hx of back infection and was on daptomycin.  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  Was treated with steroids.  Course complicated by right upper extremity DVT secondary to PICC line for which she was on Xarelto.  He is requiring oxygen at that time.  By the summer 2022 he was off oxygen and able to ambulate even without oxygen.  Course was also complicated later in May 2022 with spontaneous pneumothorax.  By the summer 2022 he was able to come off prednisone.  He also had diagnosed of atrial fibrillation for which he was on digoxin and Xarelto was continued [although originally started for DVT].  Last seen in clinic October 2022 at which time he was off oxygen and off steroids.  He was on Trelegy inhaler for pulmonary function test that back in May 2022 showed obstructive physiology.  He says he was feeling better.  He is only minimally symptomatic.  Plan at that time was to get a repeat high-resolution CT chest done in the spring 2023.  On Thanksgiving 2022 his wife had the flu and he was exposed to this and after that he started having increasing cough and fatigue.  Up until this point he was able to do walking and fishing.  He saw  pulm nurse practitioner 06/24/2021 in the pulmonary clinic.  Was given 8-day prednisone taper for presumed diagnosis of COPD exacerbation.  No antibiotic.  He did see infectious diseases at the same time and for his back infections it was noted, "10 days of linezolid + 8 weeks of  tedizolid for his spinal infection and has now been started on dalbavancin infusions every 2 weeks last month as a suppressive regim" (none of which appear to be associated with drug-induced ILD]    He then called into the pulmonary office on 07/13/2021 felt he was desaturating mildly and coughing up green mucus and fevers..  2 COVID home test were negative.  He had a fever of 100.7.  He wanted some prednisone.  We gave him some Omnicef as well and a 5-day prednisone taper but also advised that to report to the ER.  He then presented on 07/15/2021 for admission with acute hypoxic respiratory failure 58% and a history of 1 week of cough and shortness of breath getting worse.  Appears he might not have taken Omnicef.  He had lactic acidosis 2.3 at admission, white count of nearly 15,000 with lymphopenia.  CT angiogram showed acute PE segmental and subsegmental right-sided [despite being on Xarelto] started on IV heparin started on Solu-Medrol and antibiotics . CT also showed L >>> R (opposted from may 2022) GGO. and .  His Doppler ultrasound lower extremities negative for DVT.  On 07/16/2021 he was stable on 4 L oxygen.  His flu, multiplex PCR and MRSA PCR were negative.  Procalcitonin 0.21.  Echo is normal with normal RV.  Then on 12/3 1/22 seen progressively more hypoxemic now requiring 8 L of oxygen.  Briefly required even more but  looking comfortable.  When assisted to go to the bathroom even on 8 L he desaturated to 70s he has been afebrile since admission. Trop improved from 200  prior day -> 70, lactic 1.9 with normal BNP. CXR with ?> Worsening LEft sided air space disease and ccm consulted     Past Medical History:  .    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   reports that he quit smoking about 32 years ago. His smoking use included cigarettes. He has a 20.00 pack-year smoking history. He has never used smokeless tobacco.   has a past surgical history that includes Back surgery (2012 and 1994); torn rotator cuff (2010); Tonsillectomy; Eye surgery (Bilateral); Laminectomy (02/12/2019); Spinal fusion (N/A, 02/12/2019); Hardware revision (03/12/2019); Spinal fusion (N/A, 03/12/2019); Esophagogastroduodenoscopy (egd) with propofol (N/A, 01/09/2020); Colonoscopy with propofol (N/A, 01/09/2020); biopsy (01/09/2020); polypectomy (01/09/2020); REMOVAL OF RODS AND PEDICLE SCREWS T5 AND T6, EXPLORATION OF FUSION, BIOPSY TRANSPEDICULAR T5 AND T6 (N/A ) (09/14/2020); IR US Guide Bx Asp/Drain (11/13/2020); Video bronchoscopy (N/A, 11/17/2020); Bronchial biopsy (11/17/2020); Bronchial brushings (11/17/2020); Bronchial washings (11/17/2020); and Hemostasis control (11/17/2020).     Significant Hospital Events:  07/15/2021 - admit 12/31 - seen in bed 1411.  Nursing reports that currently at 8 L nasal cannula/90% when he stands up he will desaturate easily.  Chest x-ray done today suggest worsening left-sided airway inflammation.  He is already on Solu-Medrol 125 mg twice daily since today.  IV heparin continues.   Interim History / Subjective:   07/18/2021 - RVP neg, PCT now < 0.1, ESR 30, Denies aspiration, UDS - neg, Urne strep neg. Still on 8L HFNC. On RA - he desaturated to 85% in 3 min or so, HE might be feeling subjectively better  bu tnot clear if he really is   Objective   Blood pressure (!) 159/72, pulse 99, temperature 98 F (36.7 C), temperature source Oral, resp. rate (!) 23, height $RemoveBe'5\' 10"'KqBprpHsw$  (1.778 m), weight 77.1 kg, SpO2 95 %.        Intake/Output Summary (Last 24 hours) at 07/18/2021 1236 Last data filed at 07/18/2021 0900 Gross per 24 hour  Intake 1746.46 ml  Output 2300 ml  Net -553.54 ml   Filed Weights   07/15/21 1625  Weight: 77.1 kg    Examination: General Appearance:  Looks stable . Sitting on bed. Watching TV Head:  Normocephalic, without obvious abnormality, atraumatic Eyes:  PERRL - yes, conjunctiva/corneas - muddy     Ears:  Normal external ear canals, both ears Nose:  G tube - no but has [place] 8L  Throat:  ETT TUBE - no , OG tube - no Neck:  Supple,  No enlargement/tenderness/nodules Lungs: LEFT sided crackles esp left posterior Heart:  S1 and S2 normal, no murmur, CVP - no.  Pressors - no Abdomen:  Soft, no masses, no organomegaly Genitalia / Rectal:  Not done Extremities:  Extremities- intact Skin:  ntact in exposed areas . Sacral area - no Neurologic:  Sedation - none+1 -> RASS - +1 .  Moves all 4s - yes. CAM-ICU - neg . 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  Has new onset pulmonary embolism despite Xarelto [was restarted in the spring 2022 for PICC line associated DVT but continued for A. 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  Is now started on IV steroids.  Infectious disease work-up negative so far.  Differential diagnosis includes recurrence of pneumonitis [Boop after prior steroids being weaned off] OR eosinophilic pneumonias that drug and related or postviral acute lung injury from fluid exposure since Thanksgiving developing slowly OR acute on chronic diastolic dysfunction [clinically looks euvolemic] OR worsening PE but this is doubtful [biomarkers of worsening PE are stable]  07/18/21: subjectively maybe better. But still on 8L [place]  P:   Bedrest or limited mobiity only  Continue IV heparin for PE but if continues to get worse get urgent echo for RV dysfunction  Await ILD workup  -  sedimentation rate, CK, serology,-since pneumonitis profile, HIV and urine drug screen  Continue IV steroids   Oxygen for pulse ox goal greater than 92%  Continue antibiotics  [place] BiPAP QHS to aid recovery  Depending on the course might need bronchoscopy with lavage [given the recent PE not a good candidate for surgical lung biopsy]      Best practice (daily eval):  According to the hospitalist        SIGNATURE    Dr. Kalman Shan, M.D., F.C.C.P,  Pulmonary and Critical Care Medicine Staff Physician, [facility] Director - Interstitial Lung Disease  Program  Pulmonary Fibrosis [facility] at [place], [place], [address]  NPI Number:  NPI #[id number]  Pager: [phone], If no answer  -> Check AMION or Try [phone] Telephone (clinical office): [phone] Telephone (research): [phone]  12:36 PM 07/18/2021   07/18/2021 12:36 PM    LABS    PULMONARY Recent Labs  Lab 07/16/21 0449 07/17/21 0428  PHART  --  7.436  PCO2ART  --  33.8  PO2ART  --  66.9*  HCO3 25.3 22.4  O2SAT 92.3 92.0     CBC Recent Labs  Lab 07/16/21 0449 07/17/21 0508 07/18/21 0511  HGB 11.7* 11.4* 11.0*  HCT 33.8* 34.3* 33.6*  WBC 12.9* 13.8* 15.0*  PLT 229 274 300    COAGULATION Recent Labs  Lab 07/15/21 1715  INR 1.3*    CARDIAC  No results for input(s): TROPONINI in the last 168 hours. No results for input(s): PROBNP in the last 168 hours.  CHEMISTRY Recent Labs  Lab 07/15/21 1715 07/16/21 0449 07/17/21 0508  NA 127* 131* 135  K 4.2 4.4 4.4  CL 93* 95* 100  CO2 23 25 23   GLUCOSE 482* 212* 286*  BUN 25* 19 27*  CREATININE 0.91 0.52* 0.68  CALCIUM 8.7* 8.4* 8.5*  MG  --  1.8 2.3  PHOS  --  3.1 3.1   Estimated Creatinine Clearance: 79.8 mL/min (by C-G formula based on SCr of 0.68 mg/dL).   LIVER Recent Labs  Lab 07/15/21 1715 07/16/21 0449 07/17/21 0508  AST 22 21  --   ALT 17 18  --   ALKPHOS 157* 143*  --   BILITOT 1.3* 1.5*  --   PROT 6.4* 5.9*  --   ALBUMIN 3.4* 2.9* 2.8*  INR 1.3*  --   --      INFECTIOUS Recent Labs  Lab 07/15/21 2225 07/17/21 1107 07/17/21 1250 07/18/21 0511  LATICACIDVEN 1.5 1.9 1.8 1.0  PROCALCITON 0.21 0.12  --  <0.10     ENDOCRINE CBG (last 3)  Recent Labs    07/18/21 0011 07/18/21 0748 07/18/21 1104  GLUCAP 242* 234* 390*         IMAGING x48h  - image(s) personally visualized  -   highlighted in bold CT Chest High Resolution  Result Date: 07/18/2021 CLINICAL DATA:  76 year old male with clinical suspicion of interstitial lung disease. EXAM: CT CHEST WITHOUT CONTRAST TECHNIQUE: Multidetector CT imaging of the chest was performed following the standard protocol without intravenous contrast. High resolution imaging of the lungs, as well as inspiratory and expiratory imaging, was performed. COMPARISON:  Chest CTA 07/15/2021. FINDINGS: Cardiovascular: Heart size is normal. There is no significant pericardial fluid, thickening or pericardial calcification. There is aortic atherosclerosis, as well as atherosclerosis  of the great vessels of the mediastinum and the coronary arteries, including calcified atherosclerotic plaque in the left main, left anterior descending and left circumflex coronary arteries. Mediastinum/Nodes: No pathologically enlarged mediastinal or hilar lymph nodes. Please note that accurate exclusion of hilar adenopathy is limited on noncontrast CT scans. Esophagus is unremarkable in appearance. No axillary lymphadenopathy. Lungs/Pleura: Patchy areas of ground-glass attenuation, septal thickening, thickening of the peribronchovascular interstitium and patchy peripheral predominant areas of airspace consolidation are noted in the lungs bilaterally (left-greater-than-right), most compatible with severe multilobar bilateral pneumonia. Near complete sparing of the right middle lobe is noted. Scattered areas of cylindrical bronchiectasis are noted. Inspiratory and expiratory imaging is unremarkable. Trace left pleural effusion. No definite suspicious appearing pulmonary nodules or masses are noted. Diffuse bronchial wall thickening with mild centrilobular and paraseptal emphysema. Upper Abdomen: Aortic atherosclerosis. Musculoskeletal: There are no aggressive appearing lytic or blastic lesions noted in the visualized portions of the skeleton. Posterior rod and screw fixation device in place throughout the thoracolumbar spine. IMPRESSION: 1. The appearance of the lungs is most compatible with severe multilobar bilateral pneumonia, as above. 2. Diffuse bronchial wall thickening with mild centrilobular and paraseptal emphysema; imaging findings suggestive of underlying COPD. 3. Aortic atherosclerosis, in addition to left main and 2 vessel coronary artery disease. Assessment for potential risk factor modification, dietary therapy or pharmacologic therapy may be warranted, if clinically indicated. Aortic Atherosclerosis (ICD10-I70.0) and Emphysema (ICD10-J43.9). Electronically Signed   By: Vinnie Langton M.D.   On:  07/18/2021 11:38   DG Chest Port 1 View  Result Date: 07/17/2021 CLINICAL DATA:  76 year old male with increasing shortness of breath. EXAM: PORTABLE CHEST 1 VIEW COMPARISON:  Chest x-ray 07/15/2021. FINDINGS: Widespread areas of interstitial prominence and patchy asymmetrically distributed airspace disease is again noted in the lungs bilaterally (left greater than right). Compared to the prior examination, there is worsening aeration in the right upper lobe. No definite pleural effusions. No pneumothorax. No evidence of pulmonary edema. Heart size is normal. Upper mediastinal contours are within normal  limits. Atherosclerotic calcifications are noted in the thoracic aorta. Orthopedic rod and screw fixation hardware noted throughout the thoracolumbar spine. IMPRESSION: 1. Worsening multilobar bilateral pneumonia, as above. 2. Aortic atherosclerosis. Electronically Signed   By: Vinnie Langton M.D.   On: 07/17/2021 10:15

## 2021-07-19 ENCOUNTER — Encounter: Payer: Self-pay | Admitting: Cardiology

## 2021-07-19 ENCOUNTER — Encounter: Payer: Self-pay | Admitting: Internal Medicine

## 2021-07-19 LAB — LEGIONELLA PNEUMOPHILA SEROGP 1 UR AG: L. pneumophila Serogp 1 Ur Ag: NEGATIVE

## 2021-07-19 LAB — MAGNESIUM: Magnesium: 2.2 mg/dL (ref 1.7–2.4)

## 2021-07-19 LAB — CBC
HCT: 36.3 % — ABNORMAL LOW (ref 39.0–52.0)
Hemoglobin: 11.9 g/dL — ABNORMAL LOW (ref 13.0–17.0)
MCH: 30.2 pg (ref 26.0–34.0)
MCHC: 32.8 g/dL (ref 30.0–36.0)
MCV: 92.1 fL (ref 80.0–100.0)
Platelets: 347 10*3/uL (ref 150–400)
RBC: 3.94 MIL/uL — ABNORMAL LOW (ref 4.22–5.81)
RDW: 14.7 % (ref 11.5–15.5)
WBC: 14 10*3/uL — ABNORMAL HIGH (ref 4.0–10.5)
nRBC: 0 % (ref 0.0–0.2)

## 2021-07-19 LAB — PROCALCITONIN: Procalcitonin: <0.1 ng/mL

## 2021-07-19 LAB — BASIC METABOLIC PANEL
Anion gap: 5 (ref 5–15)
BUN: 25 mg/dL — ABNORMAL HIGH (ref 8–23)
CO2: 26 mmol/L (ref 22–32)
Calcium: 8.3 mg/dL — ABNORMAL LOW (ref 8.9–10.3)
Chloride: 103 mmol/L (ref 98–111)
Creatinine, Ser: 0.61 mg/dL (ref 0.61–1.24)
GFR, Estimated: >60 mL/min (ref >60)
Glucose, Bld: 249 mg/dL — ABNORMAL HIGH (ref 70–99)
Potassium: 4.3 mmol/L (ref 3.5–5.1)
Sodium: 134 mmol/L — ABNORMAL LOW (ref 135–145)

## 2021-07-19 LAB — GLUCOSE, CAPILLARY
Glucose-Capillary: 204 mg/dL — ABNORMAL HIGH (ref 70–99)
Glucose-Capillary: 304 mg/dL — ABNORMAL HIGH (ref 70–99)
Glucose-Capillary: 195 mg/dL — ABNORMAL HIGH (ref 70–99)
Glucose-Capillary: 339 mg/dL — ABNORMAL HIGH (ref 70–99)

## 2021-07-19 LAB — HEPARIN LEVEL (UNFRACTIONATED)
Heparin Unfractionated: 0.87 IU/mL — ABNORMAL HIGH (ref 0.30–0.70)
Heparin Unfractionated: 0.5 IU/mL (ref 0.30–0.70)
Heparin Unfractionated: 0.38 IU/mL (ref 0.30–0.70)

## 2021-07-19 MED ORDER — AMLODIPINE BESYLATE 5 MG PO TABS
5.0000 mg | ORAL_TABLET | Freq: Every day | ORAL | Status: DC
  Administered 2021-07-19 – 2021-07-24 (×6): 5 mg via ORAL
  Filled 2021-07-19 (×6): qty 1

## 2021-07-19 NOTE — Progress Notes (Signed)
ANTICOAGULATION CONSULT NOTE - Follow Up Consult  Pharmacy Consult for Heparin Indication: pulmonary embolus  Allergies  Allergen Reactions   Daptomycin     Drug induced pneumonia     Patient Measurements: Height: 5\' 10"  (177.8 cm) Weight: 77.1 kg (170 lb) IBW/kg (Calculated) : 73 Heparin Dosing Weight: 77 kg  Vital Signs: Temp: 98.8 F (37.1 C) (01/02 0350) Temp Source: Oral (01/02 0350) BP: 183/82 (01/02 0350) Pulse Rate: 83 (01/02 0350)  Labs: Recent Labs    07/16/21 1000 07/16/21 1842 07/17/21 0508 07/17/21 1107 07/17/21 1235 07/17/21 1250 07/17/21 1500 07/18/21 0511 07/19/21 0447  HGB  --    < > 11.4*  --   --   --   --  11.0* 11.9*  HCT  --   --  34.3*  --   --   --   --  33.6* 36.3*  PLT  --   --  274  --   --   --   --  300 347  APTT 33  --   --   --   --   --   --   --   --   HEPARINUNFRC 0.22*   < > 0.26*  --   --  0.46  --  0.59 0.87*  CREATININE  --   --  0.68  --   --   --   --   --   --   CKTOTAL  --   --  60  --   --   --  87  --   --   CKMB  --   --   --   --   --   --  3.1  --   --   TROPONINIHS  --   --   --  70* 70*  --   --   --   --    < > = values in this interval not displayed.     Estimated Creatinine Clearance: 79.8 mL/min (by C-G formula based on SCr of 0.68 mg/dL).   Medications:  Scheduled:   arformoterol  15 mcg Nebulization BID   And   umeclidinium bromide  1 puff Inhalation Daily   atorvastatin  10 mg Oral Daily   budesonide (PULMICORT) nebulizer solution  0.5 mg Nebulization BID   digoxin  0.125 mg Oral Daily   guaiFENesin  600 mg Oral BID   insulin aspart  0-20 Units Subcutaneous TID WC   insulin aspart  0-5 Units Subcutaneous QHS   insulin aspart  10 Units Subcutaneous TID WC   insulin glargine-yfgn  25 Units Subcutaneous Daily   mouth rinse  15 mL Mouth Rinse BID   methylPREDNISolone (SOLU-MEDROL) injection  80 mg Intravenous Q12H   pantoprazole  40 mg Oral Daily   pregabalin  75 mg Oral BID   tamsulosin  0.8 mg  Oral Daily   venlafaxine XR  150 mg Oral Q breakfast   Infusions:   sodium chloride 10 mL/hr (07/19/21 0332)   azithromycin Stopped (07/18/21 2230)   ceFEPime (MAXIPIME) IV 2 g (07/19/21 0347)   heparin 1,700 Units/hr (07/19/21 0237)    Assessment: 76 yo male presents with SOB and cough, has had for the last 3 weeks but worse the last couple days.  History of Afib and DVT/PE, on xarelto.  Pharmacy consulted to dose heparin drip for PE.   Xarelto last dose reported on 12/28, but baseline HL 0.11 (12/30), and not elevated at baseline  as would be expected on Xarelto.  OK to dose using heparin level.   07/19/2021: Heparin level 0.87 supra-therapeutic on heparin 1700 units/hr CBC stable  No bleeding or complications reported  Goal of Therapy:  Heparin level 0.3-0.7 units/ml Monitor platelets by anticoagulation protocol: Yes   Plan:  Decrease heparin IV infusion to 1550 units/hr Heparin level in 8 hours Daily heparin level and CBC Continue to monitor H&H and platelets  Dolly Rias RPh 07/19/2021, 6:11 AM

## 2021-07-19 NOTE — Progress Notes (Signed)
PROGRESS NOTE    Luis Brown  WJX:[id number] DOB: 1945/09/29 DOA: 07/15/2021 PCP: Mackie Pai, PA-C    Chief Complaint  Patient presents with   Low O2 Sats    Brief Narrative:  76 year old M with PMH of COPD not on oxygen, DM-2, daptomycin induced pneumonitis, A. fib/RUE DVT in 11/2020 on Xarelto, HLD, BPH, DDD, colonic polyps, anxiety and depression presenting with progressive shortness of breath for 1 week, hypoxemia, chest tightness and cough, and admitted for severe sepsis and acute hypoxic respiratory failure in the setting of CAP, left> right, and right-sided segmental and subsegmental PE with small to moderate blood clot burden.  He was hypoxic to 58% on RA while in ED.  Cultures obtained.  He was a started on vancomycin, cefepime, azithromycin, steroid and IV heparin.  PCCM consulted.     HRCT compatible with severe multilobar bilateral pneumonia and diffuse bronchial wall thickening with mild centrilobular and paraseptal emphysema.  Autoimmune and pneumonitis labs pending.  Continues to require up to 8L by HFNC.  Remains on IV cefepime, azithromycin, steroid, IV heparin and nebulizers.   Assessment & Plan:   Principal Problem:   CAP (community acquired pneumonia) Active Problems:   Essential hypertension   Diabetes mellitus without complication ([place])   Familial multiple lipoprotein-type hyperlipidemia   Hyponatremia   Drug-induced pneumonitis   Sepsis ([place])   Acute respiratory failure with hypoxia (HCC)   COPD with acute exacerbation (HCC)   History of DVT (deep vein thrombosis)   Paroxysmal A-fib (HCC)   Pulmonary emboli (HCC)   Bronchiectasis with acute exacerbation (HCC)   Severe sepsis (HCC)  1 acute hypoxic respiratory failure secondary to pneumonia/PE/COPD/concern for pneumonitis in the setting of ILD, POA -Patient presenting with worsening respiratory symptoms with cough, fatigue, failure to respond to outpatient short course of prednisone and  antibiotics, noted to have new onset PE despite Xarelto. -Patient with progressive hypoxia currently on 8 L high flow nasal cannula. -Concern for pneumonitis/postviral ALI -Patient states clinically feeling better although requiring high flow 8 L nasal cannula. -High-resolution CT consistent with multilobar pneumonia, COPD. -Patient euvolemic on examination does not look volume overloaded. -2D echo with normal EF, no right ventricular strain. -Continue IV heparin for PE, IV antibiotics of cefepime and azithromycin, IV steroids, Brovana nebs, Incruse, Pulmicort, Mucinex -Autoimmune and hypersensitivity labs ordered and pending per PCCM. -Continue BiPAP nightly. -Pulmonary/PCCM following and appreciate input and recommendations.  2.  Severe sepsis secondary to CAP, POA -Chest x-ray, CT angiogram chest and high-resolution CT consistent with bilateral pneumonia left > right. -MRSA PCR negative. -IV vancomycin have been discontinued. -Continue mucolytic's, antitussives, IV cefepime, IV azithromycin, Brovana, Pulmicort, Incruse, Mucinex.  3.  Acute segmental and subsegmental right-sided PE -CT angiogram chest consistent with right upper lobe and right lower lobe PE. -Patient with prior history of right upper extremity DVT 11/2020 on Xarelto with compliance still developed a PE and likely failure with Xarelto. -2D echo with normal EF with no right ventricular strain noted. -Lower extremity Dopplers negative for DVT. -Continue IV heparin until respiratory status has improved significantly. -Dr. Cyndia Skeeters discussed with hematology, Dr. Lindi Adie who recommended Pradaxa and lupus anticoagulant panel.  4.  COPD exacerbation -Patient noted to have been on a prednisone taper since 07/08/2021. -Continue IV Solu-Medrol, may need a prolonged steroid taper on discharge secondary to problem #1. -Continue Brovana, Pulmicort, Incruse. -Xopenex and Atrovent nebs every 6 hours as needed.  5.  Elevated  troponin -Likely demand ischemia. -2D echo with normal  EF,NWMA. -Patient asymptomatic.  6.  Paroxysmal atrial fibrillation -Continue digoxin, for rate control. -Continue to hold metoprolol until respiratory status improves. -Heparin for anticoagulation.  7.  Uncontrolled type 2 diabetes mellitus with hyperglycemia -Hemoglobin A1c 7.4 (07/15/2021). -Likely induced with steroids. -CBG 204 this morning. -Semglee increased today to 25 units daily. -Continue NovoLog 10 units 3 times daily with meals, SSI.  8.  Hyponatremia -Improved with hydration.  9.  DDD/chronic back pain -Continue home pain regimen of Norco, Lyrica, Zanaflex.  10.  BPH without LUTS -Flomax.  11.  Depression/anxiety -Continue home regimen of Effexor, Ativan as needed.   DVT prophylaxis: Heparin Code Status: Full Family Communication: Updated patient and wife at bedside. Disposition:   Status is: Inpatient  Remains inpatient appropriate because: Severity of illness       Consultants:  PCCM/pulmonary 07/17/2021 Dr. Chase Caller  Procedures: CT angiogram chest 07/15/2021 CT chest high resolution 07/18/2021 Chest x-ray 07/15/2021, 07/17/2021 2D echo 07/16/2021 Lower extremity Dopplers 07/16/2021  Antimicrobials:  IV azithromycin 07/15/2021>>>> IV cefepime 07/16/2021>>>>> IV Rocephin 07/15/2021 X 1 DOSE IV vancomycin 07/16/2021>>> 07/17/2021   Subjective: Sitting up in chair.  On 8 L high flow nasal cannula.  States used BiPAP overnight, took a while to get used to the mask however slept well, overall generally feeling better.  No chest pain.  Feels breathing has improved.  Wife at bedside.  Objective: Vitals:   07/19/21 0800 07/19/21 0958 07/19/21 1000 07/19/21 1200  BP:  (!) 143/85 (!) 143/85 117/73  Pulse: 61 (!) 101 94 (!) 109  Resp:   20 (!) 21  Temp:    97.6 F (36.4 C)  TempSrc:    Oral  SpO2: 97%  95% 95%  Weight:      Height:        Intake/Output Summary (Last 24 hours) at  07/19/2021 1339 Last data filed at 07/19/2021 0600 Gross per 24 hour  Intake 1390.94 ml  Output 2300 ml  Net -909.06 ml   Filed Weights   07/15/21 1625  Weight: 77.1 kg    Examination:  General exam: Appears calm and comfortable  Respiratory system: Left basilar fine crackles noted.  No wheezing.  Fair air movement.  No significant rhonchus.  Speaking in full sentences.  On 8 L high flow nasal cannula.  Cardiovascular system: S1 & S2 heard, RRR. No JVD, murmurs, rubs, gallops or clicks. No pedal edema. Gastrointestinal system: Abdomen is nondistended, soft and nontender. No organomegaly or masses felt. Normal bowel sounds heard. Central nervous system: Alert and oriented. No focal neurological deficits. Extremities: Symmetric 5 x 5 power. Skin: No rashes, lesions or ulcers Psychiatry: Judgement and insight appear normal. Mood & affect appropriate.     Data Reviewed: I have personally reviewed following labs and imaging studies  CBC: Recent Labs  Lab 07/15/21 1715 07/16/21 0449 07/17/21 0508 07/18/21 0511 07/19/21 0447  WBC 14.9* 12.9* 13.8* 15.0* 14.0*  NEUTROABS 13.2* 12.0*  --   --   --   HGB 12.3* 11.7* 11.4* 11.0* 11.9*  HCT 36.2* 33.8* 34.3* 33.6* 36.3*  MCV 89.6 89.9 91.5 91.6 92.1  PLT 338 229 274 300 102    Basic Metabolic Panel: Recent Labs  Lab 07/15/21 1715 07/16/21 0449 07/17/21 0508 07/19/21 0935  NA 127* 131* 135 134*  K 4.2 4.4 4.4 4.3  CL 93* 95* 100 103  CO2 23 25 23 26   GLUCOSE 482* 212* 286* 249*  BUN 25* 19 27* 25*  CREATININE 0.91 0.52* 0.68 0.61  CALCIUM 8.7* 8.4* 8.5* 8.3*  MG  --  1.8 2.3 2.2  PHOS  --  3.1 3.1  --     GFR: Estimated Creatinine Clearance: 79.8 mL/min (by C-G formula based on SCr of 0.61 mg/dL).  Liver Function Tests: Recent Labs  Lab 07/15/21 1715 07/16/21 0449 07/17/21 0508  AST 22 21  --   ALT 17 18  --   ALKPHOS 157* 143*  --   BILITOT 1.3* 1.5*  --   PROT 6.4* 5.9*  --   ALBUMIN 3.4* 2.9* 2.8*     CBG: Recent Labs  Lab 07/18/21 1104 07/18/21 1639 07/18/21 2104 07/19/21 0724 07/19/21 1145  GLUCAP 390* 347* 151* 204* 304*     Recent Results (from the past 240 hour(s))  Blood Culture (routine x 2)     Status: None (Preliminary result)   Collection Time: 07/15/21  5:01 PM   Specimen: BLOOD  Result Value Ref Range Status   Specimen Description   Final    BLOOD RIGHT ANTECUBITAL Performed at [facility], [place] [address] [place], [place] [address]    Special Requests   Final    BOTTLES DRAWN AEROBIC AND ANAEROBIC Blood Culture adequate volume Performed at [place] [address] [place], [place] [address]    Culture   Final    NO GROWTH 4 DAYS Performed at [facility], [place] [address] [place], [place] [address]    Report Status PENDING  Incomplete  Resp Panel by RT-PCR (Flu A&B, Covid) Nasopharyngeal Swab     Status: None   Collection Time: 07/15/21  5:17 PM   Specimen: Nasopharyngeal Swab; Nasopharyngeal(NP) swabs in vial transport medium  Result Value Ref Range Status   SARS Coronavirus 2 by RT PCR NEGATIVE NEGATIVE Final    Comment: (NOTE) SARS-CoV-2 target nucleic acids are NOT DETECTED.  The SARS-CoV-2 RNA is generally detectable in upper respiratory specimens during the acute phase of infection. The lowest concentration of SARS-CoV-2 viral copies this assay can detect is 138 copies/mL. A negative result does not preclude SARS-Cov-2 infection and should not be used as the sole basis for treatment or other patient management decisions. A negative result may occur with  improper specimen collection/handling, submission of specimen other than nasopharyngeal swab, presence of viral mutation(s) within the areas targeted by this assay, and inadequate number of viral copies(<138 copies/mL). A negative result must be combined with clinical observations, patient history, and epidemiological information. The  expected result is Negative.  Fact Sheet for Patients:  EntrepreneurPulse.com.au  Fact Sheet for Healthcare Providers:  IncredibleEmployment.be  This test is no t yet approved or cleared by the [place] FDA and  has been authorized for detection and/or diagnosis of SARS-CoV-2 by FDA under an Emergency Use Authorization (EUA). This EUA will remain  in effect (meaning this test can be used) for the duration of the COVID-19 declaration under Section 564(b)(1) of the Act, 21 U.S.C.section 360bbb-3(b)(1), unless the authorization is terminated  or revoked sooner.       Influenza A by PCR NEGATIVE NEGATIVE Final   Influenza B by PCR NEGATIVE NEGATIVE Final    Comment: (NOTE) The Xpert Xpress SARS-CoV-2/FLU/RSV plus assay is intended as an aid in the diagnosis of influenza from Nasopharyngeal swab specimens and should not be used as a sole basis for treatment. Nasal washings and aspirates are unacceptable for Xpert Xpress SARS-CoV-2/FLU/RSV testing.  Fact Sheet for Patients: EntrepreneurPulse.com.au  Fact Sheet for Healthcare Providers:  IncredibleEmployment.be  This test is not yet approved or cleared by the [place] and has been authorized for detection and/or diagnosis of SARS-CoV-2 by FDA under an Emergency Use Authorization (EUA). This EUA will remain in effect (meaning this test can be used) for the duration of the COVID-19 declaration under Section 564(b)(1) of the Act, 21 U.S.C. section 360bbb-3(b)(1), unless the authorization is terminated or revoked.  Performed at [facility] [place] [address], [place], [place] [address]   Blood Culture (routine x 2)     Status: None (Preliminary result)   Collection Time: 07/15/21  5:19 PM   Specimen: BLOOD  Result Value Ref Range Status   Specimen Description   Final    BLOOD RIGHT WRIST Performed at [place] [address] [place], [place] [address]    Special Requests   Final    BOTTLES DRAWN AEROBIC AND ANAEROBIC Blood Culture adequate volume Performed at [place] [address], [place], [place] [address]    Culture   Final    NO GROWTH 4 DAYS Performed at [facility], [place] [address], [place], [place] [address]    Report Status PENDING  Incomplete  Respiratory (~20 pathogens) panel by PCR     Status: None   Collection Time: 07/15/21 10:25 PM   Specimen: Nasopharyngeal Swab; Respiratory  Result Value Ref Range Status   Adenovirus NOT DETECTED NOT DETECTED Final   Coronavirus 229E NOT DETECTED NOT DETECTED Final    Comment: (NOTE) The Coronavirus on the Respiratory Panel, DOES NOT test for the novel  Coronavirus (2019 nCoV)    Coronavirus HKU1 NOT DETECTED NOT DETECTED Final   Coronavirus NL63 NOT DETECTED NOT DETECTED Final   Coronavirus OC43 NOT DETECTED NOT DETECTED Final   Metapneumovirus NOT DETECTED NOT DETECTED Final   Rhinovirus / Enterovirus NOT DETECTED NOT DETECTED Final   Influenza A NOT DETECTED NOT DETECTED Final   Influenza B NOT DETECTED NOT DETECTED Final   Parainfluenza Virus 1 NOT DETECTED NOT DETECTED Final   Parainfluenza Virus 2 NOT DETECTED NOT DETECTED Final   Parainfluenza Virus 3 NOT DETECTED NOT DETECTED Final   Parainfluenza Virus 4 NOT DETECTED NOT DETECTED Final   Respiratory Syncytial Virus NOT DETECTED NOT DETECTED Final   Bordetella pertussis NOT DETECTED NOT DETECTED Final   Bordetella Parapertussis NOT DETECTED NOT DETECTED Final   Chlamydophila pneumoniae NOT DETECTED NOT DETECTED Final   Mycoplasma pneumoniae NOT DETECTED NOT DETECTED Final    Comment: Performed at [facility], [place]. [address], [place], [place] [address]  MRSA Next Gen by PCR, Nasal     Status: None   Collection Time: 07/17/21  2:40 PM   Specimen: Nasal Mucosa; Nasal Swab  Result Value Ref Range Status   MRSA by PCR Next Gen  NOT DETECTED NOT DETECTED Final    Comment: (NOTE) The GeneXpert MRSA Assay (FDA approved for NASAL specimens only), is one component of a comprehensive MRSA colonization surveillance program. It is not intended to diagnose MRSA infection nor to guide or monitor treatment for MRSA infections. Test performance is not FDA approved in patients less than 50 years old. Performed at [facility], [place] [address], [place], [place] [address]          Radiology Studies: CT Chest High Resolution  Result Date: 07/18/2021 CLINICAL DATA:  76 year old male with clinical suspicion of interstitial lung disease. EXAM: CT CHEST WITHOUT CONTRAST TECHNIQUE:  Multidetector CT imaging of the chest was performed following the standard protocol without intravenous contrast. High resolution imaging of the lungs, as well as inspiratory and expiratory imaging, was performed. COMPARISON:  Chest CTA 07/15/2021. FINDINGS: Cardiovascular: Heart size is normal. There is no significant pericardial fluid, thickening or pericardial calcification. There is aortic atherosclerosis, as well as atherosclerosis of the great vessels of the mediastinum and the coronary arteries, including calcified atherosclerotic plaque in the left main, left anterior descending and left circumflex coronary arteries. Mediastinum/Nodes: No pathologically enlarged mediastinal or hilar lymph nodes. Please note that accurate exclusion of hilar adenopathy is limited on noncontrast CT scans. Esophagus is unremarkable in appearance. No axillary lymphadenopathy. Lungs/Pleura: Patchy areas of ground-glass attenuation, septal thickening, thickening of the peribronchovascular interstitium and patchy peripheral predominant areas of airspace consolidation are noted in the lungs bilaterally (left-greater-than-right), most compatible with severe multilobar bilateral pneumonia. Near complete sparing of the right middle lobe is noted. Scattered areas of  cylindrical bronchiectasis are noted. Inspiratory and expiratory imaging is unremarkable. Trace left pleural effusion. No definite suspicious appearing pulmonary nodules or masses are noted. Diffuse bronchial wall thickening with mild centrilobular and paraseptal emphysema. Upper Abdomen: Aortic atherosclerosis. Musculoskeletal: There are no aggressive appearing lytic or blastic lesions noted in the visualized portions of the skeleton. Posterior rod and screw fixation device in place throughout the thoracolumbar spine. IMPRESSION: 1. The appearance of the lungs is most compatible with severe multilobar bilateral pneumonia, as above. 2. Diffuse bronchial wall thickening with mild centrilobular and paraseptal emphysema; imaging findings suggestive of underlying COPD. 3. Aortic atherosclerosis, in addition to left main and 2 vessel coronary artery disease. Assessment for potential risk factor modification, dietary therapy or pharmacologic therapy may be warranted, if clinically indicated. Aortic Atherosclerosis (ICD10-I70.0) and Emphysema (ICD10-J43.9). Electronically Signed   By: Vinnie Langton M.D.   On: 07/18/2021 11:38        Scheduled Meds:  amLODipine  5 mg Oral Daily   arformoterol  15 mcg Nebulization BID   And   umeclidinium bromide  1 puff Inhalation Daily   atorvastatin  10 mg Oral Daily   budesonide (PULMICORT) nebulizer solution  0.5 mg Nebulization BID   digoxin  0.125 mg Oral Daily   guaiFENesin  600 mg Oral BID   insulin aspart  0-20 Units Subcutaneous TID WC   insulin aspart  0-5 Units Subcutaneous QHS   insulin aspart  10 Units Subcutaneous TID WC   insulin glargine-yfgn  25 Units Subcutaneous Daily   mouth rinse  15 mL Mouth Rinse BID   methylPREDNISolone (SOLU-MEDROL) injection  80 mg Intravenous Q12H   pantoprazole  40 mg Oral Daily   pregabalin  75 mg Oral BID   tamsulosin  0.8 mg Oral Daily   venlafaxine XR  150 mg Oral Q breakfast   Continuous Infusions:  sodium  chloride 10 mL/hr (07/19/21 0332)   azithromycin Stopped (07/18/21 2230)   ceFEPime (MAXIPIME) IV 2 g (07/19/21 1254)   heparin 1,550 Units/hr (07/19/21 1117)     LOS: 4 days    Time spent: 40 minutes    Irine Seal, MD [facility]   To contact the attending provider between 7A-7P or the covering provider during after hours 7P-7A, please log into the web site [email or web address] and access using universal [place] password for that web site. If you do not have the password, please call the hospital operator.  07/19/2021, 1:39 PM

## 2021-07-19 NOTE — Progress Notes (Signed)
ANTICOAGULATION CONSULT NOTE - Follow Up Consult  Pharmacy Consult for Heparin Indication: pulmonary embolus  Allergies  Allergen Reactions   Daptomycin     Drug induced pneumonia     Patient Measurements: Height: 5\' 10"  (177.8 cm) Weight: 77.1 kg (170 lb) IBW/kg (Calculated) : 73 Heparin Dosing Weight: 77 kg  Vital Signs: Temp: 98.4 F (36.9 C) (01/02 1945) Temp Source: Oral (01/02 1945) BP: 131/68 (01/02 1945) Pulse Rate: 98 (01/02 2203)  Labs: Recent Labs    07/17/21 0508 07/17/21 1107 07/17/21 1235 07/17/21 1250 07/17/21 1500 07/18/21 0511 07/19/21 0447 07/19/21 0935 07/19/21 1434 07/19/21 2206  HGB 11.4*  --   --   --   --  11.0* 11.9*  --   --   --   HCT 34.3*  --   --   --   --  33.6* 36.3*  --   --   --   PLT 274  --   --   --   --  300 347  --   --   --   HEPARINUNFRC 0.26*  --   --    < >  --  0.59 0.87*  --  0.50 0.38  CREATININE 0.68  --   --   --   --   --   --  0.61  --   --   CKTOTAL 60  --   --   --  87  --   --   --   --   --   CKMB  --   --   --   --  3.1  --   --   --   --   --   TROPONINIHS  --  70* 70*  --   --   --   --   --   --   --    < > = values in this interval not displayed.     Estimated Creatinine Clearance: 79.8 mL/min (by C-G formula based on SCr of 0.61 mg/dL).   Medications:  Scheduled:   amLODipine  5 mg Oral Daily   arformoterol  15 mcg Nebulization BID   And   umeclidinium bromide  1 puff Inhalation Daily   atorvastatin  10 mg Oral Daily   budesonide (PULMICORT) nebulizer solution  0.5 mg Nebulization BID   digoxin  0.125 mg Oral Daily   guaiFENesin  600 mg Oral BID   insulin aspart  0-20 Units Subcutaneous TID WC   insulin aspart  0-5 Units Subcutaneous QHS   insulin aspart  10 Units Subcutaneous TID WC   insulin glargine-yfgn  25 Units Subcutaneous Daily   mouth rinse  15 mL Mouth Rinse BID   methylPREDNISolone (SOLU-MEDROL) injection  80 mg Intravenous Q12H   pantoprazole  40 mg Oral Daily   pregabalin  75  mg Oral BID   tamsulosin  0.8 mg Oral Daily   venlafaxine XR  150 mg Oral Q breakfast   Infusions:   sodium chloride 10 mL/hr (07/19/21 0332)   azithromycin 500 mg (07/19/21 2126)   ceFEPime (MAXIPIME) IV 2 g (07/19/21 1952)   heparin 1,550 Units/hr (07/19/21 1117)    Assessment: 76 yo male presents with SOB and cough, has had for the last 3 weeks but worse the last couple days.  History of Afib and DVT/PE, on xarelto.  Pharmacy consulted to dose heparin drip for PE.   Xarelto last dose reported on 12/28, but baseline HL 0.11 (12/30),  and not elevated at baseline as would be expected on Xarelto.  OK to dose using heparin level.   07/19/2021: Heparin level 0.38- therapeutic on heparin 1550 units/hr CBC stable  No bleeding or complications reported  Goal of Therapy:  Heparin level 0.3-0.7 units/ml Monitor platelets by anticoagulation protocol: Yes   Plan:  Continue heparin IV infusion at 1550 units/hr Daily heparin level and CBC Continue to monitor H&H and platelets   Netta Cedars, PharmD, BCPS 07/19/2021 10:55 PM

## 2021-07-19 NOTE — Progress Notes (Signed)
ANTICOAGULATION CONSULT NOTE - Follow Up Consult  Pharmacy Consult for Heparin Indication: pulmonary embolus  Allergies  Allergen Reactions   Daptomycin     Drug induced pneumonia     Patient Measurements: Height: 5\' 10"  (177.8 cm) Weight: 77.1 kg (170 lb) IBW/kg (Calculated) : 73 Heparin Dosing Weight: 77 kg  Vital Signs: Temp: 97.6 F (36.4 C) (01/02 1200) Temp Source: Oral (01/02 1200) BP: 117/73 (01/02 1200) Pulse Rate: 109 (01/02 1200)  Labs: Recent Labs    07/17/21 0508 07/17/21 1107 07/17/21 1235 07/17/21 1250 07/17/21 1500 07/18/21 0511 07/19/21 0447 07/19/21 0935 07/19/21 1434  HGB 11.4*  --   --   --   --  11.0* 11.9*  --   --   HCT 34.3*  --   --   --   --  33.6* 36.3*  --   --   PLT 274  --   --   --   --  300 347  --   --   HEPARINUNFRC 0.26*  --   --    < >  --  0.59 0.87*  --  0.50  CREATININE 0.68  --   --   --   --   --   --  0.61  --   CKTOTAL 60  --   --   --  87  --   --   --   --   CKMB  --   --   --   --  3.1  --   --   --   --   TROPONINIHS  --  70* 70*  --   --   --   --   --   --    < > = values in this interval not displayed.     Estimated Creatinine Clearance: 79.8 mL/min (by C-G formula based on SCr of 0.61 mg/dL).   Medications:  Scheduled:   amLODipine  5 mg Oral Daily   arformoterol  15 mcg Nebulization BID   And   umeclidinium bromide  1 puff Inhalation Daily   atorvastatin  10 mg Oral Daily   budesonide (PULMICORT) nebulizer solution  0.5 mg Nebulization BID   digoxin  0.125 mg Oral Daily   guaiFENesin  600 mg Oral BID   insulin aspart  0-20 Units Subcutaneous TID WC   insulin aspart  0-5 Units Subcutaneous QHS   insulin aspart  10 Units Subcutaneous TID WC   insulin glargine-yfgn  25 Units Subcutaneous Daily   mouth rinse  15 mL Mouth Rinse BID   methylPREDNISolone (SOLU-MEDROL) injection  80 mg Intravenous Q12H   pantoprazole  40 mg Oral Daily   pregabalin  75 mg Oral BID   tamsulosin  0.8 mg Oral Daily    venlafaxine XR  150 mg Oral Q breakfast   Infusions:   sodium chloride 10 mL/hr (07/19/21 0332)   azithromycin Stopped (07/18/21 2230)   ceFEPime (MAXIPIME) IV 2 g (07/19/21 1254)   heparin 1,550 Units/hr (07/19/21 1117)    Assessment: 76 yo male presents with SOB and cough, has had for the last 3 weeks but worse the last couple days.  History of Afib and DVT/PE, on xarelto.  Pharmacy consulted to dose heparin drip for PE.   Xarelto last dose reported on 12/28, but baseline HL 0.11 (12/30), and not elevated at baseline as would be expected on Xarelto.  OK to dose using heparin level.   07/19/2021: Heparin level 0.50 therapeutic  on heparin 1550 units/hr CBC stable  No bleeding or complications reported  Goal of Therapy:  Heparin level 0.3-0.7 units/ml Monitor platelets by anticoagulation protocol: Yes   Plan:  Continue heparin IV infusion to 1550 units/hr Obtain confirmatory Heparin level in 8 hours Daily heparin level and CBC Continue to monitor H&H and platelets   Royetta Asal, PharmD, BCPS 07/19/2021 3:22 PM

## 2021-07-19 NOTE — Progress Notes (Signed)
° °  NOt seen today but reviewed chart. Will get repeat echo to see if any RV strain developed. D/w Dr Silas Flood who is picking up service 07/20/21 . He will see patient.     SIGNATURE    Dr. Brand Males, M.D., F.C.C.P,  Pulmonary and Critical Care Medicine Staff Physician, [facility] Director - Interstitial Lung Disease  Program  Pulmonary [place] [facility] [place], [place], [address]  NPI Number:  NPI #[id number]  Pager: [phone], If no answer  -> Check AMION or Try [phone] Telephone (clinical office): [phone] Telephone (research): [phone]  7:45 PM 07/19/2021

## 2021-07-20 ENCOUNTER — Inpatient Hospital Stay (HOSPITAL_COMMUNITY): Payer: Medicare Other

## 2021-07-20 DIAGNOSIS — I2609 Other pulmonary embolism with acute cor pulmonale: Secondary | ICD-10-CM | POA: Diagnosis not present

## 2021-07-20 DIAGNOSIS — J189 Pneumonia, unspecified organism: Secondary | ICD-10-CM

## 2021-07-20 LAB — CBC
HCT: 34.8 % — ABNORMAL LOW (ref 39.0–52.0)
Hemoglobin: 11.7 g/dL — ABNORMAL LOW (ref 13.0–17.0)
MCH: 29.8 pg (ref 26.0–34.0)
MCHC: 33.6 g/dL (ref 30.0–36.0)
MCV: 88.8 fL (ref 80.0–100.0)
Platelets: 353 10*3/uL (ref 150–400)
RBC: 3.92 MIL/uL — ABNORMAL LOW (ref 4.22–5.81)
RDW: 14.4 % (ref 11.5–15.5)
WBC: 11.8 10*3/uL — ABNORMAL HIGH (ref 4.0–10.5)
nRBC: 0 % (ref 0.0–0.2)

## 2021-07-20 LAB — ANTI-DNA ANTIBODY, DOUBLE-STRANDED: ds DNA Ab: <1 IU/mL (ref 0–9)

## 2021-07-20 LAB — BASIC METABOLIC PANEL
Anion gap: 9 (ref 5–15)
BUN: 20 mg/dL (ref 8–23)
CO2: 28 mmol/L (ref 22–32)
Calcium: 8 mg/dL — ABNORMAL LOW (ref 8.9–10.3)
Chloride: 97 mmol/L — ABNORMAL LOW (ref 98–111)
Creatinine, Ser: 0.6 mg/dL — ABNORMAL LOW (ref 0.61–1.24)
GFR, Estimated: >60 mL/min (ref >60)
Glucose, Bld: 285 mg/dL — ABNORMAL HIGH (ref 70–99)
Potassium: 4.1 mmol/L (ref 3.5–5.1)
Sodium: 134 mmol/L — ABNORMAL LOW (ref 135–145)

## 2021-07-20 LAB — CULTURE, BLOOD (ROUTINE X 2)
Culture: NO GROWTH
Culture: NO GROWTH
Special Requests: ADEQUATE
Special Requests: ADEQUATE

## 2021-07-20 LAB — GLUCOSE, CAPILLARY
Glucose-Capillary: 339 mg/dL — ABNORMAL HIGH (ref 70–99)
Glucose-Capillary: 311 mg/dL — ABNORMAL HIGH (ref 70–99)
Glucose-Capillary: 113 mg/dL — ABNORMAL HIGH (ref 70–99)
Glucose-Capillary: 277 mg/dL — ABNORMAL HIGH (ref 70–99)

## 2021-07-20 LAB — ECHOCARDIOGRAM LIMITED
Height: 70 in
Weight: 2720 oz

## 2021-07-20 LAB — SJOGRENS SYNDROME-B EXTRACTABLE NUCLEAR ANTIBODY: SSB (La) (ENA) Antibody, IgG: <0.2 AI (ref 0.0–0.9)

## 2021-07-20 LAB — HEPARIN LEVEL (UNFRACTIONATED): Heparin Unfractionated: 0.49 IU/mL (ref 0.30–0.70)

## 2021-07-20 LAB — CYCLIC CITRUL PEPTIDE ANTIBODY, IGG/IGA: CCP Antibodies IgG/IgA: 1 units (ref 0–19)

## 2021-07-20 LAB — ANTI-SCLERODERMA ANTIBODY: Scleroderma (Scl-70) (ENA) Antibody, IgG: <0.2 AI (ref 0.0–0.9)

## 2021-07-20 LAB — GLOMERULAR BASEMENT MEMBRANE ANTIBODIES: GBM Ab: <0.2 units (ref 0.0–0.9)

## 2021-07-20 LAB — SJOGRENS SYNDROME-A EXTRACTABLE NUCLEAR ANTIBODY: SSA (Ro) (ENA) Antibody, IgG: <0.2 AI (ref 0.0–0.9)

## 2021-07-20 LAB — RHEUMATOID FACTOR: Rheumatoid fact SerPl-aCnc: 13.4 IU/mL (ref <14.0)

## 2021-07-20 MED ORDER — METHYLPREDNISOLONE SODIUM SUCC 125 MG IJ SOLR
80.0000 mg | Freq: Every day | INTRAMUSCULAR | Status: AC
  Administered 2021-07-21 – 2021-07-23 (×3): 80 mg via INTRAVENOUS
  Filled 2021-07-20 (×3): qty 2

## 2021-07-20 NOTE — Progress Notes (Signed)
Inpatient Diabetes Program Recommendations  AACE/ADA: New Consensus Statement on Inpatient Glycemic Control (2015)  Target Ranges:  Prepandial:   less than 140 mg/dL      Peak postprandial:   less than 180 mg/dL (1-2 hours)      Critically ill patients:  140 - 180 mg/dL    Latest Reference Range & Units 07/19/21 07:24 07/19/21 11:45 07/19/21 17:17 07/19/21 21:26  Glucose-Capillary 70 - 99 mg/dL 204 (H)  17 units Novolog  25 units Semglee @0958   304 (H)  25 units Novolog @1438  195 (H)  14 units Novolog  339 (H)  4 units Novolog     Latest Reference Range & Units 07/20/21 07:32  Glucose-Capillary 70 - 99 mg/dL 339 (H)  25 units Novolog        Home DM Meds: Januvia 50 mg daily      Metformin 1000 mg BID  Current Orders: Semglee 25 units daily     Novolog 10 units TID with meals     Novolog Resistant Correction Scale/ SSI (0-20 units) TID AC + HS     MD- Note Solumedrol reduced to 80 mg daily today.  CBG this AM remains severely elevated.  Please consider:  1. Increase Semglee slightly to 28 units Daily  2. Increase Novolog Meal Coverage to 12 units TID with meals    --Will follow patient during hospitalization--  Wyn Quaker RN, MSN, CDE Diabetes Coordinator Inpatient Glycemic Control Team Team Pager: [phone] (8a-5p)

## 2021-07-20 NOTE — Progress Notes (Signed)
NAME:  Luis Brown, MRN:  [id number], DOB:  March 25, 1946, LOS: 5 ADMISSION DATE:  07/15/2021, CONSULTATION DATE:  D12/31/22 REFERRING MD:  Dr Cyndia Skeeters, CHIEF COMPLAINT:  resp failure   BRIEF   This is a 76 year old male with poorly controlled diabetes, hemoglobin A1c 7.4, chronic opioid use for back pain, BPH on Flomax, anxiety and depression, paroxysmal A. fib on metoprolol digoxin and Xarelto .  He had long hx of back infection and was on daptomycin.  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  Was treated with steroids.  Course complicated by right upper extremity DVT secondary to PICC line for which she was on Xarelto.  He is requiring oxygen at that time.  By the summer 2022 he was off oxygen and able to ambulate even without oxygen.  Course was also complicated later in May 2022 with spontaneous pneumothorax.  By the summer 2022 he was able to come off prednisone.  He also had diagnosed of atrial fibrillation for which he was on digoxin and Xarelto was continued [although originally started for DVT].  Last seen in clinic October 2022 at which time he was off oxygen and off steroids.  He was on Trelegy inhaler for pulmonary function test that back in May 2022 showed obstructive physiology.  He says he was feeling better.  He is only minimally symptomatic.  Plan at that time was to get a repeat high-resolution CT chest done in the spring 2023.  On Thanksgiving 2022 his wife had the flu and he was exposed to this and after that he started having increasing cough and fatigue.  Up until this point he was able to do walking and fishing.  He saw  pulm nurse practitioner 06/24/2021 in the pulmonary clinic.  Was given 8-day prednisone taper for presumed diagnosis of COPD exacerbation.  No antibiotic.  He did see infectious diseases at the same time and for his back infections it was noted, "10 days of linezolid + 8 weeks of  tedizolid for his spinal infection and has now been started on dalbavancin infusions every 2 weeks last month as a suppressive regim" (none of which appear to be associated with drug-induced ILD]    He then called into the pulmonary office on 07/13/2021 felt he was desaturating mildly and coughing up green mucus and fevers..  2 COVID home test were negative.  He had a fever of 100.7.  He wanted some prednisone.  We gave him some Omnicef as well and a 5-day prednisone taper but also advised that to report to the ER.  He then presented on 07/15/2021 for admission with acute hypoxic respiratory failure 58% and a history of 1 week of cough and shortness of breath getting worse.  Appears he might not have taken Omnicef.  He had lactic acidosis 2.3 at admission, white count of nearly 15,000 with lymphopenia.  CT angiogram showed acute PE segmental and subsegmental right-sided [despite being on Xarelto] started on IV heparin started on Solu-Medrol and antibiotics . CT also showed L >>> R (opposted from may 2022) GGO. and .  His Doppler ultrasound lower extremities negative for DVT.  On 07/16/2021 he was stable on 4 L oxygen.  His flu, multiplex PCR and MRSA PCR were negative.  Procalcitonin 0.21.  Echo is normal with normal RV.  Then on 12/3 1/22 seen progressively more hypoxemic now requiring 8 L of oxygen.  Briefly required even more but  looking comfortable.  When assisted to go to the bathroom even on 8 L he desaturated to 70s he has been afebrile since admission. Trop improved from 200  prior day -> 70, lactic 1.9 with normal BNP. CXR with ?> Worsening LEft sided air space disease and ccm consulted     Past Medical History:  .    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   reports that he quit smoking about 32 years ago. His smoking use included cigarettes. He has a 20.00 pack-year smoking history. He has never used smokeless tobacco.   has a past surgical history that includes Back surgery (2012 and 1994); torn rotator cuff (2010); Tonsillectomy; Eye surgery (Bilateral); Laminectomy (02/12/2019); Spinal fusion (N/A, 02/12/2019); Hardware revision (03/12/2019); Spinal fusion (N/A, 03/12/2019); Esophagogastroduodenoscopy (egd) with propofol (N/A, 01/09/2020); Colonoscopy with propofol (N/A, 01/09/2020); biopsy (01/09/2020); polypectomy (01/09/2020); REMOVAL OF RODS AND PEDICLE SCREWS T5 AND T6, EXPLORATION OF FUSION, BIOPSY TRANSPEDICULAR T5 AND T6 (N/A ) (09/14/2020); IR US Guide Bx Asp/Drain (11/13/2020); Video bronchoscopy (N/A, 11/17/2020); Bronchial biopsy (11/17/2020); Bronchial brushings (11/17/2020); Bronchial washings (11/17/2020); and Hemostasis control (11/17/2020).     Significant Hospital Events:  07/15/2021 - admit 12/31 - seen in bed 1411.  Nursing reports that currently at 8 L nasal cannula/90% when he stands up he will desaturate easily.  Chest x-ray done today suggest worsening left-sided airway inflammation.  He is already on Solu-Medrol 125 mg twice daily since today.  IV heparin continues. 1/3 oxygen now down to 4 liters .feeling better steroids reduced to 80mg /s  Interim History / Subjective:   Feels better   Objective   Blood pressure (Abnormal) 148/123, pulse 89, temperature 98.2 F (36.8 C), temperature  source Oral, resp. rate 19, height 5\' 10"  (1.778 m), weight 77.1 kg, SpO2 96 %.        Intake/Output Summary (Last 24 hours) at 07/20/2021 1011 Last data filed at 07/20/2021 0346 Gross per 24 hour  Intake 1631.96 ml  Output 3250 ml  Net -1618.04 ml   Filed Weights   07/15/21 1625  Weight: 77.1 kg    Examination:  General 76 year old male sitting up in bed he is in no distress and speaking in full sentences  HENT NCAT no JVD Pulm crackles posterior bases. No accessory use. Currently on 4 LPM Card rrr Abd soft Ext warm and dry  Neuro intact     Resolved Hospital Problem list   x  Assessment & Plan:  Acute on chronic resp failure ILD Acute PE Diastolic HF PAF Elevated trop  Type II DM DDD w/ chronic back pain  BPH Depression     Acute on chronic hypoxic respiratory failure with ILD flare (POA)c/b acute Pulmonary emboli (POA) 07/17/21> course has been progressive since admission despite antibiotics and IV heparin.  Is now started on IV steroids.  Infectious disease work-up negative so far.  Differential diagnosis includes recurrence of pneumonitis [Boop after prior steroids being weaned off] OR eosinophilic pneumonias that drug and related or postviral acute lung injury from  fluid exposure since Thanksgiving developing slowly OR acute on chronic diastolic dysfunction [clinically looks euvolemic] OR worsening PE but this is doubtful [biomarkers of worsening PE are stable]  -seems like he is getting better  -o2 needs now down to 4 liters  Plan Cont IV heparin; will need to transition to oral therapy w/ plan to transition to pradaxa F/u ANCA, anti-DNA AB, RF studies Slow taper of steroids  Change bipap at HS to PRN  Day 6/7 cefepime  Am cxr  Lilly ACNP-BC [place] Pager # [phone] OR # [phone] if no answer

## 2021-07-20 NOTE — Progress Notes (Signed)
°  Echocardiogram 2D Echocardiogram limited has been performed.  Luis Brown M 07/20/2021, 11:25 AM

## 2021-07-20 NOTE — Progress Notes (Addendum)
PROGRESS NOTE    Luis Brown  OZD:[id number] DOB: [birth year] DOA: 07/15/2021 PCP: Mackie Pai, PA-C   Brief Narrative:   76 year old with past medical history of COPD not on oxygen, diabetes mellitus type 2, daptomycin induced pneumonitis, atrial fibrillation with right upper extremity DVT on Xarelto, hyperlipidemia, BPH, degenerative joint disease, history of anxiety and depression presented to hospital with shortness of breath for 1 week chest tightness and cough.  Patient was initially admitted to hospital for severe sepsis with acute hypoxic respiratory failure secondary to community-acquired pneumonia with small to moderate subsegmental PE.  Patient was initially hypoxic with pulse ox of 58% on room air in the ED.  He was started on broad-spectrum antibiotic including vancomycin cefepime and Zithromax steroid IV heparin and was admitted hospital for further evaluation and treatment.  PCCM was consulted.  Patient underwent HRCT with features of severe multilobar bilateral pneumonia with diffuse bronchial wall thickening and paraseptal emphysema.  Autoimmune and pneumonitis labs with sent.    Assessment & Plan:   Principal Problem:   CAP (community acquired pneumonia) Active Problems:   Essential hypertension   Diabetes mellitus without complication ([place])   Familial multiple lipoprotein-type hyperlipidemia   Hyponatremia   Drug-induced pneumonitis   Sepsis ([place])   Acute respiratory failure with hypoxia (HCC)   COPD with acute exacerbation (HCC)   History of DVT (deep vein thrombosis)   Paroxysmal A-fib (HCC)   Pulmonary emboli (HCC)   Bronchiectasis with acute exacerbation (HCC)   Severe sepsis ([place])  Acute hypoxic respiratory failure secondary to pneumonia/pulmonary embolism/COPD/concern for pneumonitis in the setting of ILD, present on admission.  Patient is on 4 L of oxygen via nasal cannula at this time.  HRCT with multilobar pneumonia/.OPD.  Does not look follow  overloaded.  20 echocardiogram with normal EF.  On IV heparin for PE.  Continue antibiotics with cefazolin and Zithromax.  Continue steroids nebulizers Pulmicort Mucinex.  Follow autoimmune and hypersensitivity labs.  Was on BiPAP in the nighttime.    PCCM to follow for further recommendations.   Severe sepsis secondary to CAP, POA -Chest x-ray necessity with pneumonia.  MRSA PCR negative.  On IV cefepime and Zithromax at this time.  We will continue   Acute segmental and subsegmental right-sided PE -CT angiogram chest consistent with right upper lobe and right lower lobe PE. Patient with previous history of right upper extremity DVT on Xarelto as outpatient.  2D echocardiogram without any right ventricular strain.  Duplex ultrasound negative for peripheral DVT.  Previous provider Dr. Cyndia Skeeters discussed with hematology Dr. Lindi Adie who recommended Pradaxa and lupus anticoagulant panel.  History of hardware associated thoracic spine infections.  Seen by Dr. Juleen China as outpatient infectious disease.  On Dalvance as outpatient.  Due on Thursday.  Spoke with pharmacy about it.  COPD exacerbation Patient was on prednisone taper since 07/08/2021.  Continue IV Solu-Medrol, Brovana, Pulmicort, Incruse.  Continue nebulizers.    Elevated troponin -Likely demand ischemia.  2D echocardiogram with normal ejection fraction.  No regional wall motion abnormality.  No chest pain.  Paroxysmal atrial fibrillation Toprol on hold until respiratory status improved.  Continue digoxin.  On heparin for anticoagulation.  Rate controlled at this time.  Uncontrolled type 2 diabetes mellitus with hyperglycemia -Hemoglobin A1c 7.4 (07/15/2021).  Likely secondary to steroids.  Continue with sliding scale insulin mealtime insulin and Semglee.  Dose of Semglee was increased to 25 units yesterday.  We will continue to monitor closely on steroids.  Hyponatremia Improved.  Latest sodium of 134.  DDD/chronic back pain Continue  home regimen of Norco, Lyrica, Zanaflex.   BPH without LUTS Continue Flomax.   Depression/anxiety Continue Effexor, Ativan as needed.   DVT prophylaxis: Heparin IV  Code Status: Full code  Family Communication: Spoke with the patient's family at bedside.   Disposition: Likely home with home health in 2 to 3 days.  Status is: Inpatient  Remains inpatient appropriate because: Severity of illness, respiratory failure requiring oxygen, IV antibiotics.    Consultants:  PCCM/pulmonary 07/17/2021 Dr. Chase Caller Verbal consult with Dr. Lindi Adie.  Procedures: None  Antimicrobials:  IV azithromycin 08-06-2021>>>> IV cefepime 07/16/2021>>>>>   Subjective: Today, patient was seen and examined at bedside.  States that his breathing is better.  Denies overt pain.  On 4 L of oxygen.  Denies any dizziness nausea vomiting fever chills  Objective: Vitals:   07/20/21 0200 07/20/21 0400 07/20/21 0430 07/20/21 0758  BP: (!) 157/75 (!) 148/123    Pulse: 62 89    Resp: 18 (!) 23 19   Temp:  98.2 F (36.8 C)    TempSrc:  Oral    SpO2: 96% 95% 99% 96%  Weight:      Height:        Intake/Output Summary (Last 24 hours) at 07/20/2021 1420 Last data filed at 07/20/2021 1246 Gross per 24 hour  Intake 1511.96 ml  Output 4150 ml  Net -2638.04 ml    Filed Weights   2021-08-06 1625  Weight: 77.1 kg    Physical examination: General:  Average built, not in obvious distress on nasal cannula oxygen HENT:   No scleral pallor or icterus noted. Oral mucosa is moist.  Chest:   Diminished breath sounds bilaterally.  Coarse breath sounds noted with crackles. CVS: S1 &S2 heard. No murmur.  Regular rate and rhythm. Abdomen: Soft, nontender, nondistended.  Bowel sounds are heard.   Extremities: No cyanosis, clubbing or edema.  Peripheral pulses are palpable. Psych: Alert, awake and oriented, normal mood CNS:  No cranial nerve deficits.  Power equal in all extremities.   Skin: Warm and dry.  No  rashes noted.  Data Reviewed: I have personally reviewed the following labs and imaging studies.   CBC: Recent Labs  Lab 08/06/21 1715 07/16/21 0449 07/17/21 0508 07/18/21 0511 07/19/21 0447 07/20/21 0414  WBC 14.9* 12.9* 13.8* 15.0* 14.0* 11.8*  NEUTROABS 13.2* 12.0*  --   --   --   --   HGB 12.3* 11.7* 11.4* 11.0* 11.9* 11.7*  HCT 36.2* 33.8* 34.3* 33.6* 36.3* 34.8*  MCV 89.6 89.9 91.5 91.6 92.1 88.8  PLT 338 229 274 300 347 353     Basic Metabolic Panel: Recent Labs  Lab 08/06/2021 1715 07/16/21 0449 07/17/21 0508 07/19/21 0935 07/20/21 0414  NA 127* 131* 135 134* 134*  K 4.2 4.4 4.4 4.3 4.1  CL 93* 95* 100 103 97*  CO2 23 25 23 26 28   GLUCOSE 482* 212* 286* 249* 285*  BUN 25* 19 27* 25* 20  CREATININE 0.91 0.52* 0.68 0.61 0.60*  CALCIUM 8.7* 8.4* 8.5* 8.3* 8.0*  MG  --  1.8 2.3 2.2  --   PHOS  --  3.1 3.1  --   --      GFR: Estimated Creatinine Clearance: 79.8 mL/min (A) (by C-G formula based on SCr of 0.6 mg/dL (L)).  Liver Function Tests: Recent Labs  Lab Aug 06, 2021 1715 07/16/21 0449 07/17/21 0508  AST 22 21  --   ALT 17  18  --   ALKPHOS 157* 143*  --   BILITOT 1.3* 1.5*  --   PROT 6.4* 5.9*  --   ALBUMIN 3.4* 2.9* 2.8*     CBG: Recent Labs  Lab 07/19/21 1145 07/19/21 1717 07/19/21 2126 07/20/21 0732 07/20/21 1134  GLUCAP 304* 195* 339* 339* 311*      Recent Results (from the past 240 hour(s))  Blood Culture (routine x 2)     Status: None   Collection Time: 07/15/21  5:01 PM   Specimen: BLOOD  Result Value Ref Range Status   Specimen Description   Final    BLOOD RIGHT ANTECUBITAL Performed at [facility], [place] [address] [place], [place] [address]    Special Requests   Final    BOTTLES DRAWN AEROBIC AND ANAEROBIC Blood Culture adequate volume Performed at [facility] [address] [place], [place] [address]    Culture   Final    NO GROWTH 5 DAYS Performed at [facility],  [place] [address], [place], [place] [address]    Report Status 07/20/2021 FINAL  Final  Resp Panel by RT-PCR (Flu A&B, Covid) Nasopharyngeal Swab     Status: None   Collection Time: 07/15/21  5:17 PM   Specimen: Nasopharyngeal Swab; Nasopharyngeal(NP) swabs in vial transport medium  Result Value Ref Range Status   SARS Coronavirus 2 by RT PCR NEGATIVE NEGATIVE Final    Comment: (NOTE) SARS-CoV-2 target nucleic acids are NOT DETECTED.  The SARS-CoV-2 RNA is generally detectable in upper respiratory specimens during the acute phase of infection. The lowest concentration of SARS-CoV-2 viral copies this assay can detect is 138 copies/mL. A negative result does not preclude SARS-Cov-2 infection and should not be used as the sole basis for treatment or other patient management decisions. A negative result may occur with  improper specimen collection/handling, submission of specimen other than nasopharyngeal swab, presence of viral mutation(s) within the areas targeted by this assay, and inadequate number of viral copies(<138 copies/mL). A negative result must be combined with clinical observations, patient history, and epidemiological information. The expected result is Negative.  Fact Sheet for Patients:  EntrepreneurPulse.com.au  Fact Sheet for Healthcare Providers:  IncredibleEmployment.be  This test is no t yet approved or cleared by the [place] FDA and  has been authorized for detection and/or diagnosis of SARS-CoV-2 by FDA under an Emergency Use Authorization (EUA). This EUA will remain  in effect (meaning this test can be used) for the duration of the COVID-19 declaration under Section 564(b)(1) of the Act, 21 U.S.C.section 360bbb-3(b)(1), unless the authorization is terminated  or revoked sooner.       Influenza A by PCR NEGATIVE NEGATIVE Final   Influenza B by PCR NEGATIVE NEGATIVE Final    Comment: (NOTE) The Xpert Xpress  SARS-CoV-2/FLU/RSV plus assay is intended as an aid in the diagnosis of influenza from Nasopharyngeal swab specimens and should not be used as a sole basis for treatment. Nasal washings and aspirates are unacceptable for Xpert Xpress SARS-CoV-2/FLU/RSV testing.  Fact Sheet for Patients: EntrepreneurPulse.com.au  Fact Sheet for Healthcare Providers: IncredibleEmployment.be  This test is not yet approved or cleared by the [place] FDA and has been authorized for detection and/or diagnosis of SARS-CoV-2 by FDA under an Emergency Use Authorization (EUA). This EUA will remain in effect (meaning this test can be used) for the duration of the COVID-19 declaration under Section 564(b)(1) of the Act, 21 U.S.C. section 360bbb-3(b)(1), unless the  authorization is terminated or revoked.  Performed at [facility], [place] [address] [place], [place] [address]   Blood Culture (routine x 2)     Status: None   Collection Time: 07/15/21  5:19 PM   Specimen: BLOOD  Result Value Ref Range Status   Specimen Description   Final    BLOOD RIGHT WRIST Performed at [facility] [address] [place], [place] [address]    Special Requests   Final    BOTTLES DRAWN AEROBIC AND ANAEROBIC Blood Culture adequate volume Performed at [facility] [address], [place], [place] [address]    Culture   Final    NO GROWTH 5 DAYS Performed at [facility], [place] [address], [place], [place] [address]    Report Status 07/20/2021 FINAL  Final  Respiratory (~20 pathogens) panel by PCR     Status: None   Collection Time: 07/15/21 10:25 PM   Specimen: Nasopharyngeal Swab; Respiratory  Result Value Ref Range Status   Adenovirus NOT DETECTED NOT DETECTED Final   Coronavirus 229E NOT DETECTED NOT DETECTED Final    Comment: (NOTE) The Coronavirus on the Respiratory Panel, DOES NOT test for the novel  Coronavirus  (2019 nCoV)    Coronavirus HKU1 NOT DETECTED NOT DETECTED Final   Coronavirus NL63 NOT DETECTED NOT DETECTED Final   Coronavirus OC43 NOT DETECTED NOT DETECTED Final   Metapneumovirus NOT DETECTED NOT DETECTED Final   Rhinovirus / Enterovirus NOT DETECTED NOT DETECTED Final   Influenza A NOT DETECTED NOT DETECTED Final   Influenza B NOT DETECTED NOT DETECTED Final   Parainfluenza Virus 1 NOT DETECTED NOT DETECTED Final   Parainfluenza Virus 2 NOT DETECTED NOT DETECTED Final   Parainfluenza Virus 3 NOT DETECTED NOT DETECTED Final   Parainfluenza Virus 4 NOT DETECTED NOT DETECTED Final   Respiratory Syncytial Virus NOT DETECTED NOT DETECTED Final   Bordetella pertussis NOT DETECTED NOT DETECTED Final   Bordetella Parapertussis NOT DETECTED NOT DETECTED Final   Chlamydophila pneumoniae NOT DETECTED NOT DETECTED Final   Mycoplasma pneumoniae NOT DETECTED NOT DETECTED Final    Comment: Performed at [facility], [place] [address] [place], [place] [address]  MRSA Next Gen by PCR, Nasal     Status: None   Collection Time: 07/17/21  2:40 PM   Specimen: Nasal Mucosa; Nasal Swab  Result Value Ref Range Status   MRSA by PCR Next Gen NOT DETECTED NOT DETECTED Final    Comment: (NOTE) The GeneXpert MRSA Assay (FDA approved for NASAL specimens only), is one component of a comprehensive MRSA colonization surveillance program. It is not intended to diagnose MRSA infection nor to guide or monitor treatment for MRSA infections. Test performance is not FDA approved in patients less than 75 years old. Performed at [facility], [place] [address], [place], [place] [address]        Radiology Studies: No results found.   Scheduled Meds:  amLODipine  5 mg Oral Daily   arformoterol  15 mcg Nebulization BID   And   umeclidinium bromide  1 puff Inhalation Daily   atorvastatin  10 mg Oral Daily   budesonide (PULMICORT) nebulizer solution  0.5 mg Nebulization BID   digoxin   0.125 mg Oral Daily   guaiFENesin  600 mg Oral BID   insulin aspart  0-20 Units Subcutaneous TID WC   insulin aspart  0-5 Units Subcutaneous QHS   insulin aspart  10 Units Subcutaneous TID WC   insulin glargine-yfgn  25 Units Subcutaneous Daily   mouth rinse  15 mL Mouth Rinse BID   [START ON 07/21/2021] methylPREDNISolone (SOLU-MEDROL) injection  80 mg Intravenous Daily   pantoprazole  40 mg Oral Daily   pregabalin  75 mg Oral BID   tamsulosin  0.8 mg Oral Daily   venlafaxine XR  150 mg Oral Q breakfast   Continuous Infusions:  sodium chloride 10 mL/hr (07/19/21 0332)   ceFEPime (MAXIPIME) IV 2 g (07/20/21 1206)   heparin 1,550 Units/hr (07/20/21 0355)     LOS: 5 days   Flora Lipps, MD [facility] 07/20/2021, 2:20 PM

## 2021-07-20 NOTE — Progress Notes (Signed)
Patient is declining BIPAP tonight but will attempt to wear tomorrow once he shaves and can get a better seal with the mask. BIPAP machine is in room on standby.

## 2021-07-20 NOTE — Progress Notes (Signed)
ANTICOAGULATION CONSULT NOTE - Follow Up Consult  Pharmacy Consult for Heparin Indication: pulmonary embolus  Allergies  Allergen Reactions   Daptomycin     Drug induced pneumonia     Patient Measurements: Height: 5\' 10"  (177.8 cm) Weight: 77.1 kg (170 lb) IBW/kg (Calculated) : 73 Heparin Dosing Weight: 77 kg  Vital Signs: Temp: 98.2 F (36.8 C) (01/03 0400) Temp Source: Oral (01/03 0400) BP: 148/123 (01/03 0400) Pulse Rate: 89 (01/03 0400)  Labs: Recent Labs    07/17/21 1235 07/17/21 1250 07/17/21 1500 07/18/21 0511 07/19/21 0447 07/19/21 0935 07/19/21 1434 07/19/21 2206 07/20/21 0414  HGB  --    < >  --  11.0* 11.9*  --   --   --  11.7*  HCT  --   --   --  33.6* 36.3*  --   --   --  34.8*  PLT  --   --   --  300 347  --   --   --  353  HEPARINUNFRC  --    < >  --  0.59 0.87*  --  0.50 0.38 0.49  CREATININE  --   --   --   --   --  0.61  --   --  0.60*  CKTOTAL  --   --  87  --   --   --   --   --   --   CKMB  --   --  3.1  --   --   --   --   --   --   TROPONINIHS 70*  --   --   --   --   --   --   --   --    < > = values in this interval not displayed.     Estimated Creatinine Clearance: 79.8 mL/min (A) (by C-G formula based on SCr of 0.6 mg/dL (L)).   Medications:  Scheduled:   amLODipine  5 mg Oral Daily   arformoterol  15 mcg Nebulization BID   And   umeclidinium bromide  1 puff Inhalation Daily   atorvastatin  10 mg Oral Daily   budesonide (PULMICORT) nebulizer solution  0.5 mg Nebulization BID   digoxin  0.125 mg Oral Daily   guaiFENesin  600 mg Oral BID   insulin aspart  0-20 Units Subcutaneous TID WC   insulin aspart  0-5 Units Subcutaneous QHS   insulin aspart  10 Units Subcutaneous TID WC   insulin glargine-yfgn  25 Units Subcutaneous Daily   mouth rinse  15 mL Mouth Rinse BID   [START ON 07/21/2021] methylPREDNISolone (SOLU-MEDROL) injection  80 mg Intravenous Daily   pantoprazole  40 mg Oral Daily   pregabalin  75 mg Oral BID    tamsulosin  0.8 mg Oral Daily   venlafaxine XR  150 mg Oral Q breakfast   Infusions:   sodium chloride 10 mL/hr (07/19/21 0332)   azithromycin 500 mg (07/19/21 2126)   ceFEPime (MAXIPIME) IV 2 g (07/20/21 1206)   heparin 1,550 Units/hr (07/20/21 0355)    Assessment: 76 yo male presents with SOB and cough, has had for the last 3 weeks but worse the last couple days.  History of Afib and DVT/PE, on xarelto.  Pharmacy consulted to dose heparin drip for PE.   Xarelto last dose reported on 12/28, but baseline HL 0.11 (12/30), and not elevated at baseline as would be expected on Xarelto.  OK to dose using  heparin level.   07/20/2021: Heparin level 0.49 therapeutic on heparin 1550 units/hr CBC stable  No bleeding or complications reported per RN   Goal of Therapy:  Heparin level 0.3-0.7 units/ml Monitor platelets by anticoagulation protocol: Yes   Plan:  Continue heparin IV infusion to 1550 units/hr Daily heparin level and CBC Continue to monitor H&H and platelets   Royetta Asal, PharmD, BCPS 07/20/2021 12:07 PM

## 2021-07-20 NOTE — Telephone Encounter (Signed)
Patient advised to discuss his infusion with the provider that is seeing him in the hospital. Patient will reach out to his [facility] nurse to let her know he is currently admitted in the hospital.  Luis Brown

## 2021-07-21 LAB — BASIC METABOLIC PANEL
Anion gap: 7 (ref 5–15)
BUN: 21 mg/dL (ref 8–23)
CO2: 30 mmol/L (ref 22–32)
Calcium: 7.8 mg/dL — ABNORMAL LOW (ref 8.9–10.3)
Chloride: 98 mmol/L (ref 98–111)
Creatinine, Ser: 0.53 mg/dL — ABNORMAL LOW (ref 0.61–1.24)
GFR, Estimated: >60 mL/min (ref >60)
Glucose, Bld: 140 mg/dL — ABNORMAL HIGH (ref 70–99)
Potassium: 3.6 mmol/L (ref 3.5–5.1)
Sodium: 135 mmol/L (ref 135–145)

## 2021-07-21 LAB — ANCA PROFILE
Anti-MPO Antibodies: <0.2 units (ref 0.0–0.9)
Anti-PR3 Antibodies: <0.2 units (ref 0.0–0.9)
Atypical P-ANCA titer: <1:20 {titer}
C-ANCA: <1:20 {titer}
P-ANCA: <1:20 {titer}

## 2021-07-21 LAB — GLUCOSE, CAPILLARY
Glucose-Capillary: 121 mg/dL — ABNORMAL HIGH (ref 70–99)
Glucose-Capillary: 264 mg/dL — ABNORMAL HIGH (ref 70–99)
Glucose-Capillary: 280 mg/dL — ABNORMAL HIGH (ref 70–99)
Glucose-Capillary: 371 mg/dL — ABNORMAL HIGH (ref 70–99)

## 2021-07-21 LAB — CBC
HCT: 34.6 % — ABNORMAL LOW (ref 39.0–52.0)
Hemoglobin: 11.4 g/dL — ABNORMAL LOW (ref 13.0–17.0)
MCH: 29.5 pg (ref 26.0–34.0)
MCHC: 32.9 g/dL (ref 30.0–36.0)
MCV: 89.6 fL (ref 80.0–100.0)
Platelets: 359 10*3/uL (ref 150–400)
RBC: 3.86 MIL/uL — ABNORMAL LOW (ref 4.22–5.81)
RDW: 14.4 % (ref 11.5–15.5)
WBC: 10.1 10*3/uL (ref 4.0–10.5)
nRBC: 0 % (ref 0.0–0.2)

## 2021-07-21 LAB — HEPARIN LEVEL (UNFRACTIONATED): Heparin Unfractionated: 0.34 IU/mL (ref 0.30–0.70)

## 2021-07-21 LAB — PHOSPHORUS: Phosphorus: 2.6 mg/dL (ref 2.5–4.6)

## 2021-07-21 LAB — MAGNESIUM: Magnesium: 2 mg/dL (ref 1.7–2.4)

## 2021-07-21 MED ORDER — DABIGATRAN ETEXILATE MESYLATE 150 MG PO CAPS
150.0000 mg | ORAL_CAPSULE | Freq: Two times a day (BID) | ORAL | Status: DC
  Administered 2021-07-21 – 2021-07-23 (×5): 150 mg via ORAL
  Filled 2021-07-21 (×5): qty 1

## 2021-07-21 MED ORDER — INSULIN GLARGINE-YFGN 100 UNIT/ML ~~LOC~~ SOLN
28.0000 [IU] | Freq: Every day | SUBCUTANEOUS | Status: DC
  Administered 2021-07-22 – 2021-07-24 (×3): 28 [IU] via SUBCUTANEOUS
  Filled 2021-07-21 (×3): qty 0.28

## 2021-07-21 NOTE — Progress Notes (Signed)
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  Patient was then admitted to hospital for severe sepsis with acute hypoxic respiratory failure secondary to community-acquired pneumonia with small to moderate subsegmental PE.  Patient was initially hypoxic with pulse ox of 58% on room air in the ED.  He was started on broad-spectrum antibiotics including vancomycin cefepime and Zithromax, steroid IV, heparin and was admitted hospital for further evaluation and treatment.  PCCM was consulted.  Patient underwent HRCT with features of severe multilobar bilateral pneumonia with diffuse bronchial wall thickening and paraseptal emphysema.  Autoimmune and pneumonitis labs were sent.    During hospitalization,patient felt little better but continues to require supplemental oxygen.  Pulmonary following for further recommendation.  Pulmonary recommending IV Solu-Medrol for now.  Will change to Pradaxa from today.  Assessment & Plan:   Principal Problem:   CAP (community acquired pneumonia) Active Problems:   Essential hypertension   Diabetes mellitus without complication ([place])   Familial multiple lipoprotein-type hyperlipidemia   Hyponatremia   Drug-induced pneumonitis   Sepsis ([place])   Acute respiratory failure with hypoxia (HCC)   COPD with acute exacerbation (HCC)   History of DVT (deep vein thrombosis)   Paroxysmal A-fib (HCC)   Pulmonary emboli (HCC)   Bronchiectasis with acute exacerbation (HCC)   Severe sepsis ([place])  Acute hypoxic respiratory failure  secondary to pneumonia/pulmonary embolism/COPD/concern for pneumonitis in the setting of ILD, present on admission.  Patient is still on 4 L of oxygen via nasal cannula at this time.  HRCT with multilobar pneumonia/ COPD. 2 D echocardiogram with normal EF.  On IV heparin for PE.  Continue antibiotics with cefepime 6/7, completed course of Zithromax.  Continue IV steroids, nebulizers, Pulmicort Mucinex.  Ambulatory following.  Follow autoimmune and hypersensitivity labs.  SSA antibody, scleroderma antibody negative, HIV nonreactive glomerular basement membrane antibody negative, antidouble-stranded DNA negative, rheumatoid factor within normal range, CCCP antibodies negative.  ESR of 30.  ANCA profile pending.  Hypersensitivity pneumonitis panel pending.  Pulmonary still continuing IV steroids at this time.   Severe sepsis secondary to CAP, present on admission. -Chest x-ray showed pneumonia.  MRSA PCR negative.  On IV cefepime at this time.  We will continue pleated 7-day course.  Completed course of Zithromax.  Acute segmental and subsegmental right-sided PE -CT angiogram of the chest consistent with right upper lobe and right lower lobe PE. Patient with previous history of right upper extremity DVT on Xarelto as outpatient.  2D echocardiogram without any right ventricular strain.  Duplex ultrasound negative for peripheral DVT.  Previous provider Dr. Cyndia Skeeters discussed with hematology Dr. Lindi Adie who recommended Pradaxa and lupus anticoagulant panel.  Lupus anticoagulant panel was negative.  We will consult pharmacy to transition to Pradaxa today.  History of hardware associated thoracic spine infections.  Seen by Dr. Juleen China as outpatient infectious disease.  On Dalvance as outpatient.  Due on Thursday.  Spoke with pharmacy about it.  Pharmacy will arrange for Dalvance tomorrow.  COPD exacerbation   Continue IV Solu-Medrol, Brovana, Pulmicort, Incruse.  Continue nebulizers.    Elevated troponin -Likely  demand ischemia.  2D echocardiogram with normal ejection fraction.  No regional wall motion abnormality.  Denies any chest pain.  Paroxysmal atrial fibrillation Toprol on hold until respiratory status improved.  Continue digoxin.  On heparin for anticoagulation.  Rate controlled at this time.  Uncontrolled type 2 diabetes mellitus with hyperglycemia -Hemoglobin A1c 7.4 (08/11/2021).  Likely secondary to steroids.  Continue with sliding scale insulin,  mealtime insulin and Semglee.  Dose of Semglee was increased to 25 units on 07/19/21.  Will change to 28 units starting today.  We will continue to monitor closely on steroids.  Hyponatremia Resolved.  Latest sodium of 135.  DDD/chronic back pain Continue home regimen of Norco, Lyrica, Zanaflex.   BPH  Continue Flomax.   Depression/anxiety Continue Effexor, Ativan as needed.  DVT prophylaxis: Heparin IV  Code Status: Full code  Family Communication:  I again spoke with the patient's family at bedside and answered all the queries they had.   Disposition: Likely home with home health in 2 to 3 days.  Follow pulmonary recommendations disposition..  Status is: Inpatient  Remains inpatient appropriate because: Severity of illness, respiratory failure requiring oxygen, IV antibiotics, pulmonary follow-up, IV steroids.   Consultants:  PCCM/pulmonary 07/17/2021 Dr. Chase Caller Verbal consult with Dr. Lindi Adie.  Procedures: None  Antimicrobials:  IV cefepime 07/16/2021>>>>>   Subjective: Today, patient was seen and examined at bedside.  Patient states that he continues to feel better with breathing.  Patient's wife at bedside.  Denies any chest pain, nausea, vomiting, fever or chills.  Objective: Vitals:   07/20/21 2036 07/20/21 2056 07/21/21 0337 07/21/21 0727  BP:   136/70 (!) 145/73  Pulse:  (!) 103 94 94  Resp:  16 19 (!) 22  Temp: 98.1 F (36.7 C)  97.8 F (36.6 C) 97.8 F (36.6 C)  TempSrc: Oral  Oral Oral  SpO2:  97%  95% 92%  Weight:      Height:        Intake/Output Summary (Last 24 hours) at 07/21/2021 0738 Last data filed at 07/21/2021 0600 Gross per 24 hour  Intake 1163.56 ml  Output 3450 ml  Net -2286.44 ml    Filed Weights   08/11/2021 1625  Weight: 77.1 kg    Physical examination:  General:  Average built, not in obvious distress, nasal cannula oxygen HENT:   No scleral pallor or icterus noted. Oral mucosa is moist.  Chest:    Diminished breath sounds bilaterally.  Bilateral crackles are noted CVS: S1 &S2 heard. No murmur.  Regular rate and rhythm. Abdomen: Soft, nontender, nondistended.  Bowel sounds are heard.   Extremities: No cyanosis, clubbing or edema.  Peripheral pulses are palpable. Psych: Alert, awake and oriented, normal mood CNS:  No cranial nerve deficits.  Power equal in all extremities.   Skin: Warm and dry.  No rashes noted.   Data Reviewed: I have personally reviewed the following labs and imaging studies.   CBC: Recent Labs  Lab 2021-08-11 1715 07/16/21 0449 07/17/21 0508 07/18/21 0511 07/19/21 0447 07/20/21 0414 07/21/21 0439  WBC 14.9* 12.9* 13.8* 15.0* 14.0* 11.8* 10.1  NEUTROABS 13.2* 12.0*  --   --   --   --   --   HGB 12.3* 11.7* 11.4* 11.0* 11.9* 11.7* 11.4*  HCT 36.2* 33.8* 34.3* 33.6* 36.3* 34.8* 34.6*  MCV 89.6 89.9 91.5 91.6 92.1 88.8 89.6  PLT 338 229 274 300 347 353 359     Basic Metabolic Panel: Recent Labs  Lab 07/16/21 0449 07/17/21 0508  07/19/21 0935 07/20/21 0414 07/21/21 0439  NA 131* 135 134* 134* 135  K 4.4 4.4 4.3 4.1 3.6  CL 95* 100 103 97* 98  CO2 $Re'25 23 26 28 30  'xJG$ GLUCOSE 212* 286* 249* 285* 140*  BUN 19 27* 25* 20 21  CREATININE 0.52* 0.68 0.61 0.60* 0.53*  CALCIUM 8.4* 8.5* 8.3* 8.0* 7.8*  MG 1.8 2.3 2.2  --  2.0  PHOS 3.1 3.1  --   --  2.6     GFR: Estimated Creatinine Clearance: 79.8 mL/min (A) (by C-G formula based on SCr of 0.53 mg/dL (L)).  Liver Function Tests: Recent Labs  Lab 07/15/21 1715  07/16/21 0449 07/17/21 0508  AST 22 21  --   ALT 17 18  --   ALKPHOS 157* 143*  --   BILITOT 1.3* 1.5*  --   PROT 6.4* 5.9*  --   ALBUMIN 3.4* 2.9* 2.8*     CBG: Recent Labs  Lab 07/20/21 0732 07/20/21 1134 07/20/21 1618 07/20/21 2212 07/21/21 0725  GLUCAP 339* 311* 113* 277* 121*      Recent Results (from the past 240 hour(s))  Blood Culture (routine x 2)     Status: None   Collection Time: 07/15/21  5:01 PM   Specimen: BLOOD  Result Value Ref Range Status   Specimen Description   Final    BLOOD RIGHT ANTECUBITAL Performed at [facility], [place] [address] [place], [place] [address]    Special Requests   Final    BOTTLES DRAWN AEROBIC AND ANAEROBIC Blood Culture adequate volume Performed at [place] [address], [place], [place] [address]    Culture   Final    NO GROWTH 5 DAYS Performed at [facility], [place] [address], [place], [place] [address]    Report Status 07/20/2021 FINAL  Final  Resp Panel by RT-PCR (Flu A&B, Covid) Nasopharyngeal Swab     Status: None   Collection Time: 07/15/21  5:17 PM   Specimen: Nasopharyngeal Swab; Nasopharyngeal(NP) swabs in vial transport medium  Result Value Ref Range Status   SARS Coronavirus 2 by RT PCR NEGATIVE NEGATIVE Final    Comment: (NOTE) SARS-CoV-2 target nucleic acids are NOT DETECTED.  The SARS-CoV-2 RNA is generally detectable in upper respiratory specimens during the acute phase of infection. The lowest concentration of SARS-CoV-2 viral copies this assay can detect is 138 copies/mL. A negative result does not preclude SARS-Cov-2 infection and should not be used as the sole basis for treatment or other patient management decisions. A negative result may occur with  improper specimen collection/handling, submission of specimen other than nasopharyngeal swab, presence of viral mutation(s) within the areas targeted by this assay, and inadequate number of  viral copies(<138 copies/mL). A negative result must be combined with clinical observations, patient history, and epidemiological information. The expected result is Negative.  Fact Sheet for Patients:  EntrepreneurPulse.com.au  Fact Sheet for Healthcare Providers:  IncredibleEmployment.be  This test is no t yet approved or cleared by the [place] FDA and  has been authorized for detection and/or diagnosis of SARS-CoV-2 by FDA under an Emergency Use Authorization (EUA). This EUA will remain  in effect (meaning this test can be used) for the duration of the COVID-19 declaration under Section 564(b)(1) of the Act, 21 U.S.C.section 360bbb-3(b)(1), unless the authorization is terminated  or revoked sooner.       Influenza A by PCR NEGATIVE NEGATIVE Final   Influenza B by  PCR NEGATIVE NEGATIVE Final    Comment: (NOTE) The Xpert Xpress SARS-CoV-2/FLU/RSV plus assay is intended as an aid in the diagnosis of influenza from Nasopharyngeal swab specimens and should not be used as a sole basis for treatment. Nasal washings and aspirates are unacceptable for Xpert Xpress SARS-CoV-2/FLU/RSV testing.  Fact Sheet for Patients: EntrepreneurPulse.com.au  Fact Sheet for Healthcare Providers: IncredibleEmployment.be  This test is not yet approved or cleared by the [place] FDA and has been authorized for detection and/or diagnosis of SARS-CoV-2 by FDA under an Emergency Use Authorization (EUA). This EUA will remain in effect (meaning this test can be used) for the duration of the COVID-19 declaration under Section 564(b)(1) of the Act, 21 U.S.C. section 360bbb-3(b)(1), unless the authorization is terminated or revoked.  Performed at [facility], [place] [address] [place], [place] [address]   Blood Culture (routine x 2)     Status: None   Collection Time: 07/15/21  5:19 PM   Specimen: BLOOD   Result Value Ref Range Status   Specimen Description   Final    BLOOD RIGHT WRIST Performed at [place] [address] [place], [place] [address]    Special Requests   Final    BOTTLES DRAWN AEROBIC AND ANAEROBIC Blood Culture adequate volume Performed at [place] [address], [place], [place] [address]    Culture   Final    NO GROWTH 5 DAYS Performed at [facility], [place] [address], [place], [place] [address]    Report Status 07/20/2021 FINAL  Final  Respiratory (~20 pathogens) panel by PCR     Status: None   Collection Time: 07/15/21 10:25 PM   Specimen: Nasopharyngeal Swab; Respiratory  Result Value Ref Range Status   Adenovirus NOT DETECTED NOT DETECTED Final   Coronavirus 229E NOT DETECTED NOT DETECTED Final    Comment: (NOTE) The Coronavirus on the Respiratory Panel, DOES NOT test for the novel  Coronavirus (2019 nCoV)    Coronavirus HKU1 NOT DETECTED NOT DETECTED Final   Coronavirus NL63 NOT DETECTED NOT DETECTED Final   Coronavirus OC43 NOT DETECTED NOT DETECTED Final   Metapneumovirus NOT DETECTED NOT DETECTED Final   Rhinovirus / Enterovirus NOT DETECTED NOT DETECTED Final   Influenza A NOT DETECTED NOT DETECTED Final   Influenza B NOT DETECTED NOT DETECTED Final   Parainfluenza Virus 1 NOT DETECTED NOT DETECTED Final   Parainfluenza Virus 2 NOT DETECTED NOT DETECTED Final   Parainfluenza Virus 3 NOT DETECTED NOT DETECTED Final   Parainfluenza Virus 4 NOT DETECTED NOT DETECTED Final   Respiratory Syncytial Virus NOT DETECTED NOT DETECTED Final   Bordetella pertussis NOT DETECTED NOT DETECTED Final   Bordetella Parapertussis NOT DETECTED NOT DETECTED Final   Chlamydophila pneumoniae NOT DETECTED NOT DETECTED Final   Mycoplasma pneumoniae NOT DETECTED NOT DETECTED Final    Comment: Performed at [facility], [place] [address], [place], [place] [address]  MRSA Next Gen by PCR, Nasal     Status: None    Collection Time: 07/17/21  2:40 PM   Specimen: Nasal Mucosa; Nasal Swab  Result Value Ref Range Status   MRSA by PCR Next Gen NOT DETECTED NOT DETECTED Final    Comment: (NOTE) The GeneXpert MRSA Assay (FDA approved for NASAL specimens only), is one component of a comprehensive MRSA colonization surveillance program. It is not intended to diagnose MRSA infection nor to guide or monitor treatment for MRSA infections. Test  performance is not FDA approved in patients less than 53 years old. Performed at [facility], [place] [address], [place], [place] [address]        Radiology Studies: ECHOCARDIOGRAM LIMITED  Result Date: 07/20/2021    ECHOCARDIOGRAM LIMITED REPORT   Patient Name:   Luis Brown Date of Exam: 07/20/2021 Medical Rec #:  [id number]        Height:       70.0 in Accession #:    [id number]       Weight:       170.0 lb Date of Birth:  10/03/1945         BSA:          1.948 m Patient Age:    1 years         BP:           142/94 mmHg Patient Gender: M                HR:           102 bpm. Exam Location:  Inpatient Procedure: Limited Echo, Cardiac Doppler and Color Doppler Indications:    Pulmonary Embolus I26.09  History:        Patient has prior history of Echocardiogram examinations, most                 recent 07/16/2021. Arrythmias:Atrial Fibrillation; Risk                 Factors:Hypertension, Diabetes and Dyslipidemia.  Sonographer:    Darlina Sicilian RDCS Referring Phys: [place]  1. Left ventricular ejection fraction, by estimation, is 60 to 65%. The left ventricle has normal function. The left ventricle has no regional wall motion abnormalities. Left ventricular diastolic parameters are indeterminate.  2. Right ventricular systolic function is normal. The right ventricular size is normal. There is normal pulmonary artery systolic pressure.  3. The mitral valve is normal in structure. No evidence of mitral valve regurgitation. No evidence of  mitral stenosis.  4. The aortic valve is tricuspid. Aortic valve regurgitation is not visualized. No aortic stenosis is present.  5. The inferior vena cava is normal in size with greater than 50% respiratory variability, suggesting right atrial pressure of 3 mmHg. Comparison(s): A prior study was performed on 12/30.2022. No significant change from prior study. FINDINGS  Left Ventricle: Left ventricular ejection fraction, by estimation, is 60 to 65%. The left ventricle has normal function. The left ventricle has no regional wall motion abnormalities. The left ventricular internal cavity size was normal in size. There is  no concentric left ventricular hypertrophy. Left ventricular diastolic parameters are indeterminate. Right Ventricle: The right ventricular size is normal. No increase in right ventricular wall thickness. Right ventricular systolic function is normal. There is normal pulmonary artery systolic pressure. The tricuspid regurgitant velocity is 2.39 m/s, and  with an assumed right atrial pressure of 3 mmHg, the estimated right ventricular systolic pressure is 11.6 mmHg. Left Atrium: Left atrial size was normal in size. Right Atrium: Right atrial size was normal in size. Pericardium: There is no evidence of pericardial effusion. Presence of epicardial fat layer. Mitral Valve: The mitral valve is normal in structure. Mild mitral annular calcification. No evidence of mitral valve stenosis. Tricuspid Valve: The tricuspid valve is normal in structure. Tricuspid valve regurgitation is trivial. No evidence of tricuspid stenosis. Aortic Valve: The aortic valve is tricuspid. Aortic valve regurgitation is not visualized. No aortic stenosis is  present. Pulmonic Valve: The pulmonic valve was normal in structure. Pulmonic valve regurgitation is not visualized. No evidence of pulmonic stenosis. Aorta: The aortic root is normal in size and structure. Venous: The inferior vena cava is normal in size with greater than 50%  respiratory variability, suggesting right atrial pressure of 3 mmHg. IAS/Shunts: No atrial level shunt detected by color flow Doppler. LEFT VENTRICLE PLAX 2D LVIDd:         3.90 cm LV PW:         1.03 cm LV IVS:        1.01 cm  TRICUSPID VALVE TR Peak grad:   22.8 mmHg TR Vmax:        239.00 cm/s Kardie Tobb DO Electronically signed by Berniece Salines DO Signature Date/Time: 07/20/2021/3:19:44 PM    Final      Scheduled Meds:  amLODipine  5 mg Oral Daily   arformoterol  15 mcg Nebulization BID   And   umeclidinium bromide  1 puff Inhalation Daily   atorvastatin  10 mg Oral Daily   budesonide (PULMICORT) nebulizer solution  0.5 mg Nebulization BID   digoxin  0.125 mg Oral Daily   guaiFENesin  600 mg Oral BID   insulin aspart  0-20 Units Subcutaneous TID WC   insulin aspart  0-5 Units Subcutaneous QHS   insulin aspart  10 Units Subcutaneous TID WC   insulin glargine-yfgn  25 Units Subcutaneous Daily   mouth rinse  15 mL Mouth Rinse BID   methylPREDNISolone (SOLU-MEDROL) injection  80 mg Intravenous Daily   pantoprazole  40 mg Oral Daily   pregabalin  75 mg Oral BID   tamsulosin  0.8 mg Oral Daily   venlafaxine XR  150 mg Oral Q breakfast   Continuous Infusions:  sodium chloride 10 mL/hr (07/19/21 0332)   ceFEPime (MAXIPIME) IV 2 g (07/21/21 0345)   heparin 1,550 Units/hr (07/21/21 0600)     LOS: 6 days   Flora Lipps, MD [facility] 07/21/2021, 7:38 AM

## 2021-07-21 NOTE — Progress Notes (Signed)
ANTICOAGULATION CONSULT NOTE - Follow Up Consult  Pharmacy Consult for Heparin >> Dabigatran Indication: pulmonary embolus  Allergies  Allergen Reactions   Daptomycin     Drug induced pneumonia     Patient Measurements: Height: 5\' 10"  (177.8 cm) Weight: 77.1 kg (170 lb) IBW/kg (Calculated) : 73 Heparin Dosing Weight: 77 kg  Vital Signs: Temp: 97.6 F (36.4 C) (01/04 1135) Temp Source: Oral (01/04 1135) BP: 107/59 (01/04 1135) Pulse Rate: 94 (01/04 1135)  Labs: Recent Labs    07/19/21 0447 07/19/21 0935 07/19/21 1434 07/19/21 2206 07/20/21 0414 07/21/21 0439  HGB 11.9*  --   --   --  11.7* 11.4*  HCT 36.3*  --   --   --  34.8* 34.6*  PLT 347  --   --   --  353 359  HEPARINUNFRC 0.87*  --    < > 0.38 0.49 0.34  CREATININE  --  0.61  --   --  0.60* 0.53*   < > = values in this interval not displayed.     Estimated Creatinine Clearance: 79.8 mL/min (A) (by C-G formula based on SCr of 0.53 mg/dL (L)).   Medications:  Scheduled:   amLODipine  5 mg Oral Daily   arformoterol  15 mcg Nebulization BID   And   umeclidinium bromide  1 puff Inhalation Daily   atorvastatin  10 mg Oral Daily   budesonide (PULMICORT) nebulizer solution  0.5 mg Nebulization BID   dabigatran  150 mg Oral Q12H   digoxin  0.125 mg Oral Daily   guaiFENesin  600 mg Oral BID   insulin aspart  0-20 Units Subcutaneous TID WC   insulin aspart  0-5 Units Subcutaneous QHS   insulin aspart  10 Units Subcutaneous TID WC   [START ON 07/22/2021] insulin glargine-yfgn  28 Units Subcutaneous Daily   mouth rinse  15 mL Mouth Rinse BID   methylPREDNISolone (SOLU-MEDROL) injection  80 mg Intravenous Daily   pantoprazole  40 mg Oral Daily   pregabalin  75 mg Oral BID   tamsulosin  0.8 mg Oral Daily   venlafaxine XR  150 mg Oral Q breakfast   Infusions:   sodium chloride 10 mL/hr (07/19/21 0332)   ceFEPime (MAXIPIME) IV 2 g (07/21/21 1242)   heparin 1,550 Units/hr (07/21/21 1409)    Assessment: 76 yo  male presents with SOB and cough, has had for the last 3 weeks but worse the last couple days.  History of Afib and DVT/PE, on xarelto.  Pharmacy consulted to dose heparin drip for PE.   Xarelto last dose reported on 12/28, but baseline HL 0.11 (12/30), and not elevated at baseline as would be expected on Xarelto.  OK to dose using heparin level.   07/21/2021: No new labs since AM Hematology recommends transitioning Xarelto to Pradaxa given suspected Xarelto failure  Goal of Therapy:  Heparin level 0.3-0.7 units/ml Monitor platelets by anticoagulation protocol: Yes   Plan:  Start Pradaxa 150 mg PO bid tonight at 10p; stop heparin with first dose of Pradaxa Pharmacy to provide Pradaxa education prior to discharge  Reuel Boom, PharmD, [place] 07/21/2021, 4:04 PM

## 2021-07-21 NOTE — Progress Notes (Signed)
@  0335 Notified by CCMD that pt has episode of 9 beats of paroxysmal junction tachycardia, pt alert, VSS,  BP 136/70, HR 94, O2 sat 95 % 4L [place] , given pain med for chronic back pain, no s/s of respiratory distress. NP Olena Heckle notified and no new orders, will continue plan of care.

## 2021-07-21 NOTE — Progress Notes (Signed)
NAME:  Luis Brown, MRN:  [id number], DOB:  August 11, 1945, LOS: 6 ADMISSION DATE:  07/15/2021, CONSULTATION DATE:  D12/31/22 REFERRING MD:  Dr Cyndia Skeeters, CHIEF COMPLAINT:  resp failure   BRIEF   This is a 76 year old male with poorly controlled diabetes, hemoglobin A1c 7.4, chronic opioid use for back pain, BPH on Flomax, anxiety and depression, paroxysmal A. fib on metoprolol digoxin and Xarelto .  He had long hx of back infection and was on daptomycin.  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  Was treated with steroids.  Course complicated by right upper extremity DVT secondary to PICC line for which she was on Xarelto.  He is requiring oxygen at that time.  By the summer 2022 he was off oxygen and able to ambulate even without oxygen.  Course was also complicated later in May 2022 with spontaneous pneumothorax.  By the summer 2022 he was able to come off prednisone.  He also had diagnosed of atrial fibrillation for which he was on digoxin and Xarelto was continued [although originally started for DVT].  Last seen in clinic October 2022 at which time he was off oxygen and off steroids.  He was on Trelegy inhaler for pulmonary function test that back in May 2022 showed obstructive physiology.  He says he was feeling better.  He is only minimally symptomatic.  Plan at that time was to get a repeat high-resolution CT chest done in the spring 2023.  On Thanksgiving 2022 his wife had the flu and he was exposed to this and after that he started having increasing cough and fatigue.  Up until this point he was able to do walking and fishing.  He saw  pulm nurse practitioner 06/24/2021 in the pulmonary clinic.  Was given 8-day prednisone taper for presumed diagnosis of COPD exacerbation.  No antibiotic.  He did see infectious diseases at the same time and for his back infections it was noted, "10 days of linezolid + 8 weeks of  tedizolid for his spinal infection and has now been started on dalbavancin infusions every 2 weeks last month as a suppressive regim" (none of which appear to be associated with drug-induced ILD]    He then called into the pulmonary office on 07/13/2021 felt he was desaturating mildly and coughing up green mucus and fevers..  2 COVID home test were negative.  He had a fever of 100.7.  He wanted some prednisone.  We gave him some Omnicef as well and a 5-day prednisone taper but also advised that to report to the ER.  He then presented on 07/15/2021 for admission with acute hypoxic respiratory failure 58% and a history of 1 week of cough and shortness of breath getting worse.  Appears he might not have taken Omnicef.  He had lactic acidosis 2.3 at admission, white count of nearly 15,000 with lymphopenia.  CT angiogram showed acute PE segmental and subsegmental right-sided [despite being on Xarelto] started on IV heparin started on Solu-Medrol and antibiotics . CT also showed L >>> R (opposted from may 2022) GGO. and .  His Doppler ultrasound lower extremities negative for DVT.  On 07/16/2021 he was stable on 4 L oxygen.  His flu, multiplex PCR and MRSA PCR were negative.  Procalcitonin 0.21.  Echo is normal with normal RV.  Then on 12/3 1/22 seen progressively more hypoxemic now requiring 8 L of oxygen.  Briefly required even more but  looking comfortable.  When assisted to go to the bathroom even on 8 L he desaturated to 70s he has been afebrile since admission. Trop improved from 200  prior day -> 70, lactic 1.9 with normal BNP. CXR with ?> Worsening LEft sided air space disease and ccm consulted     Past Medical History:  .    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   reports that he quit smoking about 32 years ago. His smoking use included cigarettes. He has a 20.00 pack-year smoking history. He has never used smokeless tobacco.   has a past surgical history that includes Back surgery (2012 and 1994); torn rotator cuff (2010); Tonsillectomy; Eye surgery (Bilateral); Laminectomy (02/12/2019); Spinal fusion (N/A, 02/12/2019); Hardware revision (03/12/2019); Spinal fusion (N/A, 03/12/2019); Esophagogastroduodenoscopy (egd) with propofol (N/A, 01/09/2020); Colonoscopy with propofol (N/A, 01/09/2020); biopsy (01/09/2020); polypectomy (01/09/2020); REMOVAL OF RODS AND PEDICLE SCREWS T5 AND T6, EXPLORATION OF FUSION, BIOPSY TRANSPEDICULAR T5 AND T6 (N/A ) (09/14/2020); IR US Guide Bx Asp/Drain (11/13/2020); Video bronchoscopy (N/A, 11/17/2020); Bronchial biopsy (11/17/2020); Bronchial brushings (11/17/2020); Bronchial washings (11/17/2020); and Hemostasis control (11/17/2020).     Significant Hospital Events:  07/15/2021 - admit 12/31 - seen in bed 1411.  Nursing reports that currently at 8 L nasal cannula/90% when he stands up he will desaturate easily.  Chest x-ray done today suggest worsening left-sided airway inflammation.  He is already on Solu-Medrol 125 mg twice daily since today.  IV heparin continues. 1/3 oxygen now down to 4 liters .feeling better steroids reduced to 80mg /s  Interim History / Subjective:   Continues to feel better   Objective   Blood pressure (Abnormal) 107/59, pulse 94, temperature 97.6 F (36.4 C),  temperature source Oral, resp. rate 19, height 5\' 10"  (1.778 m), weight 77.1 kg, SpO2 96 %.        Intake/Output Summary (Last 24 hours) at 07/21/2021 1421 Last data filed at 07/21/2021 0758 Gross per 24 hour  Intake 846.34 ml  Output 3150 ml  Net -2303.66 ml   Filed Weights   07/15/21 1625  Weight: 77.1 kg    Examination:  General 76 year old male now up in chair. He is in no distress HENT NCAT no JVD  Pulm dry crackles both bases. Now 3 lpm.  Card rrr Abd soft  Ext warm and dry  Neuro intact    Resolved Hospital Problem list   x  Assessment & Plan:  Acute on chronic resp failure ILD Acute PE Diastolic HF PAF Elevated trop  Type II DM DDD w/ chronic back pain  BPH Depression     Acute on chronic hypoxic respiratory failure with ILD flare (POA)c/b acute Pulmonary emboli (POA) 07/17/21> course has been progressive since admission despite antibiotics and IV heparin.  Is now started on IV steroids.  Infectious disease work-up negative so far.  Differential diagnosis includes recurrence of pneumonitis [Boop after prior steroids being weaned off] OR eosinophilic pneumonias that drug and related or postviral acute lung injury from fluid exposure since Thanksgiving developing  slowly OR acute on chronic diastolic dysfunction [clinically looks euvolemic] OR worsening PE but this is doubtful [biomarkers of worsening PE are stable  -seems like he is getting better  -o2 needs now down to 3 liters  Plan Cont IV heparin; OK to transition to pradaxa as recommended by heme  Cont current solumedrol and on 1/6, change to prednisone 40 and decrease by 10mg  every 10days Complete cefepime Wean oxygen Am cxr  PT consult   Erick Colace ACNP-BC [place] Pager # [phone] OR # [phone] if no answer

## 2021-07-21 NOTE — Progress Notes (Signed)
ANTICOAGULATION CONSULT NOTE - Follow Up Consult  Pharmacy Consult for Heparin Indication: pulmonary embolus  Allergies  Allergen Reactions   Daptomycin     Drug induced pneumonia     Patient Measurements: Height: 5\' 10"  (177.8 cm) Weight: 77.1 kg (170 lb) IBW/kg (Calculated) : 73 Heparin Dosing Weight: 77 kg  Vital Signs: Temp: 97.8 F (36.6 C) (01/04 0727) Temp Source: Oral (01/04 0727) BP: 145/73 (01/04 0727) Pulse Rate: 94 (01/04 0727)  Labs: Recent Labs    07/19/21 0447 07/19/21 0935 07/19/21 1434 07/19/21 2206 07/20/21 0414 07/21/21 0439  HGB 11.9*  --   --   --  11.7* 11.4*  HCT 36.3*  --   --   --  34.8* 34.6*  PLT 347  --   --   --  353 359  HEPARINUNFRC 0.87*  --    < > 0.38 0.49 0.34  CREATININE  --  0.61  --   --  0.60* 0.53*   < > = values in this interval not displayed.     Estimated Creatinine Clearance: 79.8 mL/min (A) (by C-G formula based on SCr of 0.53 mg/dL (L)).   Medications:  Scheduled:   amLODipine  5 mg Oral Daily   arformoterol  15 mcg Nebulization BID   And   umeclidinium bromide  1 puff Inhalation Daily   atorvastatin  10 mg Oral Daily   budesonide (PULMICORT) nebulizer solution  0.5 mg Nebulization BID   digoxin  0.125 mg Oral Daily   guaiFENesin  600 mg Oral BID   insulin aspart  0-20 Units Subcutaneous TID WC   insulin aspart  0-5 Units Subcutaneous QHS   insulin aspart  10 Units Subcutaneous TID WC   insulin glargine-yfgn  25 Units Subcutaneous Daily   mouth rinse  15 mL Mouth Rinse BID   methylPREDNISolone (SOLU-MEDROL) injection  80 mg Intravenous Daily   pantoprazole  40 mg Oral Daily   pregabalin  75 mg Oral BID   tamsulosin  0.8 mg Oral Daily   venlafaxine XR  150 mg Oral Q breakfast   Infusions:   sodium chloride 10 mL/hr (07/19/21 0332)   ceFEPime (MAXIPIME) IV 2 g (07/21/21 0345)   heparin 1,550 Units/hr (07/21/21 0600)    Assessment: 76 yo male presents with SOB and cough, has had for the last 3 weeks  but worse the last couple days.  History of Afib and DVT/PE, on xarelto.  Pharmacy consulted to dose heparin drip for PE.   Xarelto last dose reported on 12/28, but baseline HL 0.11 (12/30), and not elevated at baseline as would be expected on Xarelto.  OK to dose using heparin level.   07/21/2021: Heparin level 0.34 therapeutic on heparin 1550 units/hr CBC stable Scr stable  No bleeding or complications reported per RN   Goal of Therapy:  Heparin level 0.3-0.7 units/ml Monitor platelets by anticoagulation protocol: Yes   Plan:  Continue heparin IV infusion to 1550 units/hr Daily heparin level and CBC Continue to monitor H&H and platelets   Royetta Asal, PharmD, BCPS 07/21/2021 10:26 AM

## 2021-07-22 ENCOUNTER — Inpatient Hospital Stay (HOSPITAL_COMMUNITY): Payer: Medicare Other

## 2021-07-22 ENCOUNTER — Other Ambulatory Visit (HOSPITAL_COMMUNITY): Payer: Self-pay

## 2021-07-22 ENCOUNTER — Ambulatory Visit: Payer: Medicare Other

## 2021-07-22 ENCOUNTER — Encounter: Payer: Self-pay | Admitting: Internal Medicine

## 2021-07-22 LAB — CBC
HCT: 36.8 % — ABNORMAL LOW (ref 39.0–52.0)
Hemoglobin: 12.2 g/dL — ABNORMAL LOW (ref 13.0–17.0)
MCH: 30.1 pg (ref 26.0–34.0)
MCHC: 33.2 g/dL (ref 30.0–36.0)
MCV: 90.9 fL (ref 80.0–100.0)
Platelets: 390 10*3/uL (ref 150–400)
RBC: 4.05 MIL/uL — ABNORMAL LOW (ref 4.22–5.81)
RDW: 14.4 % (ref 11.5–15.5)
WBC: 9.1 10*3/uL (ref 4.0–10.5)
nRBC: 0 % (ref 0.0–0.2)

## 2021-07-22 LAB — BASIC METABOLIC PANEL
Anion gap: 6 (ref 5–15)
BUN: 21 mg/dL (ref 8–23)
CO2: 30 mmol/L (ref 22–32)
Calcium: 8.3 mg/dL — ABNORMAL LOW (ref 8.9–10.3)
Chloride: 98 mmol/L (ref 98–111)
Creatinine, Ser: 0.67 mg/dL (ref 0.61–1.24)
GFR, Estimated: >60 mL/min (ref >60)
Glucose, Bld: 240 mg/dL — ABNORMAL HIGH (ref 70–99)
Potassium: 4.1 mmol/L (ref 3.5–5.1)
Sodium: 134 mmol/L — ABNORMAL LOW (ref 135–145)

## 2021-07-22 LAB — GLUCOSE, CAPILLARY
Glucose-Capillary: 282 mg/dL — ABNORMAL HIGH (ref 70–99)
Glucose-Capillary: 259 mg/dL — ABNORMAL HIGH (ref 70–99)
Glucose-Capillary: 254 mg/dL — ABNORMAL HIGH (ref 70–99)
Glucose-Capillary: 317 mg/dL — ABNORMAL HIGH (ref 70–99)
Glucose-Capillary: 353 mg/dL — ABNORMAL HIGH (ref 70–99)

## 2021-07-22 LAB — MAGNESIUM: Magnesium: 2.2 mg/dL (ref 1.7–2.4)

## 2021-07-22 MED ORDER — FUROSEMIDE 10 MG/ML IJ SOLN
40.0000 mg | Freq: Once | INTRAMUSCULAR | Status: AC
Start: 2021-07-22 — End: 2021-07-22
  Administered 2021-07-22: 40 mg via INTRAVENOUS
  Filled 2021-07-22: qty 4

## 2021-07-22 MED ORDER — POTASSIUM CHLORIDE CRYS ER 20 MEQ PO TBCR
40.0000 meq | EXTENDED_RELEASE_TABLET | Freq: Once | ORAL | Status: AC
  Administered 2021-07-22: 40 meq via ORAL
  Filled 2021-07-22: qty 2

## 2021-07-22 NOTE — Discharge Instructions (Signed)
Information on my medicine - Pradaxa (dabigatran)   Why was Pradaxa prescribed for you? Pradaxa was prescribed to treat blood clots that may have been found in the veins of your legs (deep vein thrombosis) or in your lungs (pulmonary embolism) and to reduce the risk of them occurring again.  Pradaxa will take the place of the injectable anticoagulant medication you have been receiving for this condition for the last 5-10 days.  What do you Need to know about PradAXa? Take your Pradaxa TWICE DAILY - one capsule in the morning and one tablet in the evening with or without food.  It would be best to take the doses about the same time each day.  The capsules should not be broken, chewed or opened - they must be swallowed whole.  Do not store Pradaxa in other medication containers - once the bottle is opened the Pradaxa should be used within FOUR months; throw away any capsules that havent been by that time.  Take Pradaxa exactly as prescribed by your doctor.  DO NOT stop taking Pradaxa without talking to the doctor who prescribed the medication.  Refill your prescription before you run out.  After discharge, you should have regular check-up appointments with your healthcare provider that is prescribing your Pradaxa.  In the future your dose may need to be changed if your kidney function or weight changes by a significant amount.  What do you do if you miss a dose? If you miss a dose, take it as soon as you remember on the same day.  If your next dose is less than 6 hours away, skip the missed dose.  Do not take two doses of PRADAXA at the same time.  Important Safety Information A possible side effect of Pradaxa is bleeding. You should call your healthcare provider right away if you experience any of the following: Bleeding from an injury or your nose that does not stop. Unusual colored urine (red or dark brown) or unusual colored stools (red or black). Unusual bruising for unknown  reasons. A serious fall or if you hit your head (even if there is no bleeding).  Some medicines may interact with Pradaxa and might increase your risk of bleeding or clotting while on Pradaxa. To help avoid this, consult your healthcare provider or pharmacist prior to using any new prescription or non-prescription medications, including herbals, vitamins, non-steroidal anti-inflammatory drugs (NSAIDs) and supplements.  This website has more information on Pradaxa (dabigatran): www.RuleEnforcement.cz.

## 2021-07-22 NOTE — Progress Notes (Signed)
Inpatient Diabetes Program Recommendations  AACE/ADA: New Consensus Statement on Inpatient Glycemic Control (2015)  Target Ranges:  Prepandial:   less than 140 mg/dL      Peak postprandial:   less than 180 mg/dL (1-2 hours)      Critically ill patients:  140 - 180 mg/dL    Latest Reference Range & Units 07/21/21 07:25 07/21/21 11:33 07/21/21 16:51 07/21/21 21:48 07/22/21 09:28  Glucose-Capillary 70 - 99 mg/dL 121 (H)  Novolog 13 units 264 (H)  Novolog 21 units 280 (H)  Novolog 21 units 371 (H)  Novolog 5 units 282 (H)   Home DM Meds: Januvia 50 mg daily      Metformin 1000 mg BID  Current Orders: Semglee 28 units daily     Novolog 10 units TID with meals     Novolog Resistant Correction Scale/ SSI (0-20 units) TID AC + HS  Solumedrol 80 mg daily today.  Please consider:  1. Increase Novolog meal coverage to 14 units tid    --Will follow patient during hospitalization--  Tama Headings RN, MSN, BC-ADM Inpatient Diabetes Coordinator Team Pager [phone] (8a-5p)

## 2021-07-22 NOTE — Telephone Encounter (Signed)
Pt still admitted to [facility] hospital since last Thursday for PNA. Aware AFib ablation procedure scheduled for 07/30/2021 will be moved to March. Aware I will send date via mychart and he will let me know if ok. He appreciates the call.

## 2021-07-22 NOTE — Hospital Course (Addendum)
76 year old man PMH COPD admitted for acute hypoxic respiratory failure, sepsis secondary to pneumonia, acute pulmonary embolism.  Treated with standard therapy with some improvement but then decompensated with severe hypoxia and worsening infiltrate on chest x-ray.  Seen by pulmonology work-up for ILD.  Treated with steroids, eventually stabilized. Completed antibiotics.  Pulmonology recommended steroid taper on discharge.  Will require home oxygen.  Also noted to have PE, changed from Xarelto for treatment failure to Lovenox after discussion with hematology.  Pradaxa was not a medication the patient could afford.    Because of treatment failure with Xarelto, he was transition to enoxaparin.  Concern for ILD, seen by pulmonology with recommendation for steroids and close outpatient follow-up.  Acute hypoxic respiratory failure secondary to ILD, pneumonia/pulmonary embolism/COPD exacerbation/concern for pneumonitis in the setting of ILD, present on admission. -- Continues to improve, will need home oxygen on discharge.  Change steroids to oral in the morning.  Continue anticoagulation.  Antibiotics completed. follow-up with primary pulmonologist, Dr. Chase Caller, will facilitate  Severe sepsis secondary to bilateral pneumonia, present on admission. --Sepsis symptomatology resolved.  Completed antibiotics. CT angiogram chest and high-resolution CT consistent with bilateral pneumonia left > right. change to prednisone 40 and decrease by 10mg  every 10days Complete cefepime  Acute segmental and subsegmental right-sided PE --2D echocardiogram without right ventricular strain.  No lower extremity DVT.Dr. Cyndia Skeeters discussed with hematology Dr. Lindi Adie who recommended Pradaxa and lupus anticoagulant panel.  Lupus anticoagulant panel was negative.   -- Finances prohibited Pradaxa.  Discussed with hematology, recommendation for Lovenox or warfarin.  Patient elected for Lovenox.    History of hardware associated  thoracic spine infections.  --Followed by Dr. Juleen China as outpatient.  On Dalvance as outpatient.  Next dose will be coordinated by [facility]  COPD exacerbation --Stable.  Continue steriod, Brovana, Pulmicort, Incruse.    Demand ischemia --echo normal nl LVEF.  No regional wall motion abnormality.   Paroxysmal atrial fibrillation --Continue digoxin, resume metoprolol   Uncontrolled type 2 diabetes mellitus with hyperglycemia --Hemoglobin A1c 7.4 (07/15/2021).  Secondary to steroids.  --Continue with sliding scale insulin,  mealtime insulin and Semglee.  DDD/chronic back pain Continue home regimen of Norco, Lyrica, Zanaflex.  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  No eosinophilia on CBC -Currently on a steroid for COPD exacerbation  IMPRESSION: 1. Segmental and subsegmental arterial emboli in the right upper and lower lobes. The overall clot burden appears small to moderate. There may be additional small vessel emboli in the right middle lobe, difficult to confirm given respiratory motion. 2. Cardiomegaly with a right chamber predominance indicating right heart strain with prominent pulmonary trunk and no further visible emboli. 3. Interstitial and extensive ground-glass and patchy consolidative change in the left lung with scattered ground-glass infiltrates in the anterior segment of the right upper lobe. Background COPD. Probable multi lobar pneumonia, previously seen in different locations with similar appearance. 4. Increased small cystic spaces in the left lung consolidation concerning for necrotizing pneumonic component. Follow-up study recommended after treatment. 5. Increased mildly enlarged mediastinal and right hilar lymph nodes likely reactive. 6. Aortic and coronary artery atherosclerosis. 7. Osteopenia and extensive postsurgical changes of the thoracic spine, with T6-7 endplate erosions are again noted and evidence of possible early  vertebral body ankylosis and chronic spondylodiscitis. Paraspinal soft tissue fullness appear similar.   IMPRESSION: 1. The appearance of the lungs is most compatible with severe multilobar bilateral pneumonia, as above. 2. Diffuse bronchial wall thickening with mild centrilobular and paraseptal emphysema;  imaging findings suggestive of underlying COPD. 3. Aortic atherosclerosis, in addition to left main and 2 vessel coronary artery disease. Assessment for potential risk factor modification, dietary therapy or pharmacologic therapy may be warranted, if clinically indicated.   Aortic Atherosclerosis (ICD10-I70.0) and Emphysema (ICD10-J43.9).

## 2021-07-22 NOTE — TOC Progression Note (Signed)
Transition of Care [facility]) - Progression Note    Patient Details  Name: MATTHEUS RAULS MRN: [id number] Date of Birth: 21-Nov-1945  Transition of Care [facility] CM/SW Contact  Liesel Peckenpaugh, Juliann Pulse, RN Phone Number: 07/22/2021, 2:58 PM  Clinical Narrative: Noted may need home 02-monitor-await documented 02 sats, & order for qualifications-[employer] already following.      Expected Discharge Plan: Home/Self Care Barriers to Discharge: Continued Medical Work up  Expected Discharge Plan and Services Expected Discharge Plan: Home/Self Care   Discharge Planning Services: CM Consult   Living arrangements for the past 2 months: Single Family Home                                       Social Determinants of Health (SDOH) Interventions    Readmission Risk Interventions Readmission Risk Prevention Plan 11/19/2020  Transportation Screening Complete  PCP or Specialist Appt within 5-7 Days Complete  Home Care Screening Complete  Medication Review (RN CM) Complete  Some recent data might be hidden

## 2021-07-22 NOTE — Consult Note (Signed)
° °  [facility] Inpatient Consult   07/22/2021  TASEEN MARASIGAN Apr 18, 1946 [id number]  [facility] Liaison:  coverage for [employer] [ACO] Patient: [employer] Medicare  Primary Care Provider:  Harvie Heck, Iris Pert, [facility], [place],  is an embedded provider with a Chronic Care Management team and program, and is listed for the transition of care follow up and appointments.  Patient was screened for Embedded practice service needs for chronic care management due to high risk score for unplanned readmission risk.   Plan: A referral can be sent to the [facility]  for post hospital needs. Will continue to follow for progress for [facility] post hospital needs.  Please contact for further questions,  Natividad Brood, RN BSN [facility] Liaison  [phone] business mobile phone Toll free office [phone]  Fax number: [phone] [email or web address]

## 2021-07-22 NOTE — TOC Benefit Eligibility Note (Signed)
Patient Teacher, English as a foreign language completed.    The patient is currently admitted and upon discharge could be taking dabigatran (Pradaxa) 150 mg.  Non Formulary   The patient is insured through [employer], [employer] Patient Advocate Specialist [place] Patient Advocate Team Direct Number: [phone]  Fax: [phone]

## 2021-07-22 NOTE — Progress Notes (Signed)
NAME:  Luis Brown, MRN:  [id number], DOB:  1945-12-25, LOS: 7 ADMISSION DATE:  07/15/2021, CONSULTATION DATE:  D12/31/22 REFERRING MD:  Dr Cyndia Skeeters, CHIEF COMPLAINT:  resp failure   BRIEF   This is a 76 year old male with poorly controlled diabetes, hemoglobin A1c 7.4, chronic opioid use for back pain, BPH on Flomax, anxiety and depression, paroxysmal A. fib on metoprolol digoxin and Xarelto .  He had long hx of back infection and was on daptomycin.  Then in  spring 2022 was admitted at [facility] 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  Was treated with steroids.  Course complicated by right upper extremity DVT secondary to PICC line for which she was on Xarelto.  He is requiring oxygen at that time.  By the summer 2022 he was off oxygen and able to ambulate even without oxygen.  Course was also complicated later in May 2022 with spontaneous pneumothorax.  By the summer 2022 he was able to come off prednisone.  He also had diagnosed of atrial fibrillation for which he was on digoxin and Xarelto was continued [although originally started for DVT].  Last seen in clinic October 2022 at which time he was off oxygen and off steroids.  He was on Trelegy inhaler for pulmonary function test that back in May 2022 showed obstructive physiology.  He says he was feeling better.  He is only minimally symptomatic.  Plan at that time was to get a repeat high-resolution CT chest done in the spring 2023.  On Thanksgiving 2022 his wife had the flu and he was exposed to this and after that he started having increasing cough and fatigue.  Up until this point he was able to do walking and fishing.  He saw  pulm nurse practitioner 06/24/2021 in the pulmonary clinic.  Was given 8-day prednisone taper for presumed diagnosis of COPD exacerbation.  No antibiotic.  He did see infectious diseases at the same time and for his back infections it was noted, "10 days of linezolid + 8 weeks of  tedizolid for his spinal infection and has now been started on dalbavancin infusions every 2 weeks last month as a suppressive regim" (none of which appear to be associated with drug-induced ILD]    He then called into the pulmonary office on 07/13/2021 felt he was desaturating mildly and coughing up green mucus and fevers..  2 COVID home test were negative.  He had a fever of 100.7.  He wanted some prednisone.  We gave him some Omnicef as well and a 5-day prednisone taper but also advised that to report to the ER.  He then presented on 07/15/2021 for admission with acute hypoxic respiratory failure 58% and a history of 1 week of cough and shortness of breath getting worse.  Appears he might not have taken Omnicef.  He had lactic acidosis 2.3 at admission, white count of nearly 15,000 with lymphopenia.  CT angiogram showed acute PE segmental and subsegmental right-sided [despite being on Xarelto] started on IV heparin started on Solu-Medrol and antibiotics . CT also showed L >>> R (opposted from may 2022) GGO. and .  His Doppler ultrasound lower extremities negative for DVT.  On 07/16/2021 he was stable on 4 L oxygen.  His flu, multiplex PCR and MRSA PCR were negative.  Procalcitonin 0.21.  Echo is normal with normal RV.  Then on 12/3 1/22 seen progressively more hypoxemic now requiring 8 L of oxygen.  Briefly required even more but  looking comfortable.  When assisted to go to the bathroom even on 8 L he desaturated to 70s he has been afebrile since admission. Trop improved from 200  prior day -> 70, lactic 1.9 with normal BNP. CXR with ?> Worsening LEft sided air space disease and ccm consulted     Past Medical History:  .    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   reports that he quit smoking about 32 years ago. His smoking use included cigarettes. He has a 20.00 pack-year smoking history. He has never used smokeless tobacco.   has a past surgical history that includes Back surgery (2012 and 1994); torn rotator cuff (2010); Tonsillectomy; Eye surgery (Bilateral); Laminectomy (02/12/2019); Spinal fusion (N/A, 02/12/2019); Hardware revision (03/12/2019); Spinal fusion (N/A, 03/12/2019); Esophagogastroduodenoscopy (egd) with propofol (N/A, 01/09/2020); Colonoscopy with propofol (N/A, 01/09/2020); biopsy (01/09/2020); polypectomy (01/09/2020); REMOVAL OF RODS AND PEDICLE SCREWS T5 AND T6, EXPLORATION OF FUSION, BIOPSY TRANSPEDICULAR T5 AND T6 (N/A ) (09/14/2020); IR US Guide Bx Asp/Drain (11/13/2020); Video bronchoscopy (N/A, 11/17/2020); Bronchial biopsy (11/17/2020); Bronchial brushings (11/17/2020); Bronchial washings (11/17/2020); and Hemostasis control (11/17/2020).     Significant Hospital Events:  07/15/2021 - admit 12/31 - seen in bed 1411.  Nursing reports that currently at 8 L nasal cannula/90% when he stands up he will desaturate easily.  Chest x-ray done today suggest worsening left-sided airway inflammation.  He is already on Solu-Medrol 125 mg twice daily since today.  IV heparin continues. 1/3 oxygen now down to 4 liters .feeling better steroids reduced to 80mg /s 1/5 down to 3 liters feeling better. Pradaxa started   Interim History / Subjective:   Feels a little tired but overall improved. Anxious to walk and  work w/ PT    Objective   Blood pressure (Abnormal) 148/62, pulse 68, temperature 98.1 F (36.7 C), resp. rate 19, height 5\' 10"  (1.778 m), weight 77.1 kg, SpO2 100 %.        Intake/Output Summary (Last 24 hours) at 07/22/2021 1001 Last data filed at 07/22/2021 0600 Gross per 24 hour  Intake 746.37 ml  Output 2550 ml  Net -1803.63 ml   Filed Weights   07/15/21 1625  Weight: 77.1 kg    Examination:  General 76 year old male. Resting in bed. No distress HENT NCAT no JVD>  Pulm posterior rales. L>R no accessory muscle use on 3 lpm. Speaking in full sentences Card rrr Abd soft not tender Ext warm and dry Neuro intact   Resolved Hospital Problem list     Assessment & Plan:  Acute on chronic resp failure ILD Acute PE Diastolic HF PAF Elevated trop  Type II DM DDD w/ chronic back pain  BPH Depression     Acute on chronic hypoxic respiratory failure with ILD flare (POA)c/b acute Pulmonary emboli (POA) 07/17/21> course has been progressive since admission despite antibiotics and IV heparin.  Is now started on IV steroids.  Infectious disease work-up negative so far.  Differential diagnosis includes recurrence of pneumonitis [Boop after prior steroids being weaned  off] OR eosinophilic pneumonias that drug and related or postviral acute lung injury from fluid exposure since Thanksgiving developing slowly OR acute on chronic diastolic dysfunction [clinically looks euvolemic] OR worsening PE but this is doubtful [biomarkers of worsening PE are stable  -continues to improve.  -O2 needs: same 3 lpm. Still desats when O2 off.  -PCXR L>R airspace disease. Aeration a little worse today ? Clinical lag? We haven't had CXR since 1/1.   Plan Pradaxa for PE Give final dose of solumedrol 1/6, then change to pred 40mg  on 1/7 w/ plan to decrease by 10mg  q 10d.  Lasix x1 Repeat cxr prior to dc  We will place f/u appointment w/ Ramaswamy in computer: scheduled for 1/26 at 2pm  Wean  oxygen Will need walking oximetry to determine if needs it at home  We will sign off call PRN  Erick Colace ACNP-BC [place] Pager # [phone] OR # [phone] if no answer

## 2021-07-22 NOTE — Progress Notes (Signed)
°  Progress Note Patient: Luis Brown QZR:[id number] DOB: [birth year] DOA: 07/15/2021     7 DOS: the patient was seen and examined on 07/22/2021   Brief hospital course: 76 year old man PMH including COPD admitted for hypoxic respiratory failure, pneumonia, right-sided pulmonary embolism while on Xarelto.  Transition to Pradaxa.  Pulmonology concern for ILD.  On IV steroids to finish tomorrow, can likely discharge in 48 hours.  Assessment and Plan1 Acute hypoxic respiratory failure secondary to pneumonia/pulmonary embolism/COPD/concern for pneumonitis in the setting of ILD, present on admission. -- Subjectively improved.  Continue supplemental oxygen, anticoagulation, antibiotics, steroids    Severe sepsis secondary to CAP, present on admission. --Sepsis symptomatology resolved.  Complete antibiotics.  Acute segmental and subsegmental right-sided PE --2D echocardiogram without right ventricular strain.  No lower extremity DVT.Dr. Cyndia Skeeters discussed with hematology Dr. Lindi Adie who recommended Pradaxa and lupus anticoagulant panel.  Lupus anticoagulant panel was negative.   -- Finances may limit patient's ability to take Pradaxa, pharmacy investigating with TOC.  If unable, alternatives would be Lovenox or Coumadin.   History of hardware associated thoracic spine infections. --Followed by Dr. Juleen China as outpatient.  On Dalvance as outpatient.  Next dose?  COPD exacerbation --Stable.  Continue IV Solu-Medrol, Brovana, Pulmicort, Incruse.    Demand ischemia --echo normal nl LVEF.  No regional wall motion abnormality.   Paroxysmal atrial fibrillation --Continue digoxin.    Uncontrolled type 2 diabetes mellitus with hyperglycemia --Hemoglobin A1c 7.4 (07/15/2021).  Secondary to steroids. --Continue with sliding scale insulin,  mealtime insulin and Semglee, increased today  DDD/chronic back pain Continue home regimen of Norco, Lyrica, Zanaflex.  Subjective:  Feels better, breathing  better  Objective Vital signs were reviewed and unremarkable. Physical Exam Constitutional:      General: He is not in acute distress.    Appearance: He is ill-appearing. He is not toxic-appearing.  Cardiovascular:     Rate and Rhythm: Normal rate and regular rhythm.     Heart sounds: No murmur heard.    Comments: Telemetry ST Pulmonary:     Effort: Pulmonary effort is normal. No respiratory distress.     Breath sounds: No wheezing, rhonchi or rales.  Musculoskeletal:     Right lower leg: No edema.     Left lower leg: No edema.  Neurological:     Mental Status: He is alert.  Psychiatric:        Mood and Affect: Mood normal.        Behavior: Behavior normal.     Data Reviewed:  CBG 200-300   Family Communication: none  Disposition: Status is: Inpatient  Remains inpatient appropriate because: hypoxic respiratory failure, on IV steroids         Time spent: 25 minutes  Author: Murray Hodgkins, MD 07/22/2021 5:23 PM  For on call review [email or web address].

## 2021-07-23 ENCOUNTER — Telehealth: Payer: Self-pay | Admitting: *Deleted

## 2021-07-23 ENCOUNTER — Other Ambulatory Visit: Payer: Self-pay | Admitting: Medical

## 2021-07-23 DIAGNOSIS — Z01812 Encounter for preprocedural laboratory examination: Secondary | ICD-10-CM

## 2021-07-23 DIAGNOSIS — I480 Paroxysmal atrial fibrillation: Secondary | ICD-10-CM

## 2021-07-23 DIAGNOSIS — J849 Interstitial pulmonary disease, unspecified: Secondary | ICD-10-CM

## 2021-07-23 LAB — HYPERSENSITIVITY PNEUMONITIS
A. Pullulans Abs: NEGATIVE
A.Fumigatus #1 Abs: NEGATIVE
Micropolyspora faeni, IgG: NEGATIVE
Pigeon Serum Abs: NEGATIVE
Thermoact. Saccharii: NEGATIVE
Thermoactinomyces vulgaris, IgG: NEGATIVE

## 2021-07-23 LAB — BASIC METABOLIC PANEL
Anion gap: 9 (ref 5–15)
BUN: 21 mg/dL (ref 8–23)
CO2: 28 mmol/L (ref 22–32)
Calcium: 8.4 mg/dL — ABNORMAL LOW (ref 8.9–10.3)
Chloride: 96 mmol/L — ABNORMAL LOW (ref 98–111)
Creatinine, Ser: 0.7 mg/dL (ref 0.61–1.24)
GFR, Estimated: >60 mL/min (ref >60)
Glucose, Bld: 251 mg/dL — ABNORMAL HIGH (ref 70–99)
Potassium: 4 mmol/L (ref 3.5–5.1)
Sodium: 133 mmol/L — ABNORMAL LOW (ref 135–145)

## 2021-07-23 LAB — GLUCOSE, CAPILLARY
Glucose-Capillary: 195 mg/dL — ABNORMAL HIGH (ref 70–99)
Glucose-Capillary: 284 mg/dL — ABNORMAL HIGH (ref 70–99)
Glucose-Capillary: 316 mg/dL — ABNORMAL HIGH (ref 70–99)
Glucose-Capillary: 265 mg/dL — ABNORMAL HIGH (ref 70–99)

## 2021-07-23 MED ORDER — PREDNISONE 20 MG PO TABS
40.0000 mg | ORAL_TABLET | Freq: Every day | ORAL | Status: DC
  Administered 2021-07-24: 40 mg via ORAL
  Filled 2021-07-23: qty 2

## 2021-07-23 MED ORDER — ENOXAPARIN SODIUM 80 MG/0.8ML IJ SOSY
80.0000 mg | PREFILLED_SYRINGE | Freq: Two times a day (BID) | INTRAMUSCULAR | Status: DC
  Administered 2021-07-24: 80 mg via SUBCUTANEOUS
  Filled 2021-07-23: qty 0.8

## 2021-07-23 NOTE — Care Management Important Message (Signed)
Important Message  Patient Details IM Letter placed in Patients room. Name: Luis Brown MRN: [id number] Date of Birth: November 01, 1945   Medicare Important Message Given:  Yes     Kerin Salen 07/23/2021, 10:44 AM

## 2021-07-23 NOTE — Progress Notes (Signed)
°  Progress Note   Patient: Luis Brown [id number]:[id number] DOB: April 09, 1946 DOA: 07/15/2021     8 DOS: the patient was seen and examined on 07/23/2021   Brief hospital course: 76 year old man PMH COPD admitted with acute hypoxic respiratory failure, treated for pneumonia, right-sided pulmonary embolism.  Because of treatment failure with Xarelto, he was transition to enoxaparin.  Concern for ILD, seen by pulmonology with recommendation for steroids and close outpatient follow-up.  Acute hypoxic respiratory failure secondary to pneumonia/pulmonary embolism/COPD/concern for pneumonitis in the setting of ILD, present on admission. -- Continues to improve, will need home oxygen on discharge.  Change steroids to oral in the morning.  Continue anticoagulation.  Antibiotics completed.  Severe sepsis secondary to CAP, present on admission. --Sepsis symptomatology resolved.  Completed antibiotics.  Acute segmental and subsegmental right-sided PE --2D echocardiogram without right ventricular strain.  No lower extremity DVT.Dr. Cyndia Skeeters discussed with hematology Dr. Lindi Adie who recommended Pradaxa and lupus anticoagulant panel.  Lupus anticoagulant panel was negative.   -- Finances prohibited Pradaxa.  Discussed with hematology, recommendation for Lovenox or warfarin.  Patient elected for Lovenox.    History of hardware associated thoracic spine infections.  --Followed by Dr. Juleen China as outpatient.  On Dalvance as outpatient.  Next dose will be coordinated by [facility]  COPD exacerbation --Stable.  Continue steriod, Brovana, Pulmicort, Incruse.    Demand ischemia --echo normal nl LVEF.  No regional wall motion abnormality.   Paroxysmal atrial fibrillation --Continue digoxin.    Uncontrolled type 2 diabetes mellitus with hyperglycemia --Hemoglobin A1c 7.4 (07/15/2021).  Secondary to steroids.  --Continue with sliding scale insulin,  mealtime insulin and Semglee.  DDD/chronic back pain Continue home regimen of  Norco, Lyrica, Zanaflex.       Subjective:  Feels better today, breathing better  Objective Vital signs were reviewed and unremarkable. Physical Exam Constitutional:      General: He is not in acute distress.    Appearance: He is not ill-appearing or toxic-appearing.  Cardiovascular:     Rate and Rhythm: Normal rate and regular rhythm.     Heart sounds: No murmur heard. Pulmonary:     Effort: Pulmonary effort is normal. No respiratory distress.     Breath sounds: No wheezing, rhonchi or rales.  Neurological:     Mental Status: He is alert.  Psychiatric:        Mood and Affect: Mood normal.        Behavior: Behavior normal.     Data Reviewed:  CBG high but stable, modest hyponatremia stable, remainder BMP unremarkable except for hyperglycemia.  Family Communication: none  Disposition: Status is: Inpatient  Remains inpatient appropriate because: acute hypoxia         Time spent: 35 minutes  Author: Murray Hodgkins, MD 07/23/2021 9:34 PM  For on call review [email or web address].

## 2021-07-23 NOTE — Evaluation (Signed)
Physical Therapy Evaluation Patient Details Name: Luis Brown MRN: [id number] DOB: February 26, 1946 Today's Date: 07/23/2021  History of Present Illness  76 year old man PMH COPD admitted with acute hypoxic respiratory failure, treated for pneumonia, right-sided pulmonary embolism.   Clinical Impression  Luis Brown is 76 y.o. male admitted with above HPI and diagnosis. Patient is currently limited by functional impairments below (see PT problem list). Patient lives with his spouse and is independent at baseline. Currently patient limited by reduced activity tolerance and increased supplemental O2 needs. Pt ambulated ~200' with RW for support and required 6L /min O2. Time spent educating pt on energy conservation and follow up rehab with Pulmonary Rehab setting. Patient will benefit from continued skilled PT interventions to address impairments and progress independence with mobility, recommending Outpatient Pulmonary Rehab. Acute PT will follow and progress as able.        Recommendations for follow up therapy are one component of a multi-disciplinary discharge planning process, led by the attending physician.  Recommendations may be updated based on patient status, additional functional criteria and insurance authorization.  Follow Up Recommendations  (Pulmonary Rehab)    Assistance Recommended at Discharge Frequent or constant Supervision/Assistance  Patient can return home with the following       Equipment Recommendations None recommended by PT  Recommendations for Other Services       Functional Status Assessment Patient has had a recent decline in their functional status and demonstrates the ability to make significant improvements in function in a reasonable and predictable amount of time.     Precautions / Restrictions Precautions Precautions: Fall Precaution Comments: watch sats Restrictions Weight Bearing Restrictions: No      Mobility  Bed Mobility  Pt OOB in  recliner              Transfers Overall transfer level: Needs assistance    Supervision for safety                  Ambulation/Gait Ambulation/Gait assistance: Min assist    Distance: 200 feet  Min assist to steady with no device due to 2 episodes of LOB. Pt unsteady initially with RW for support due to quick pace and cues needed for safety. Pt improved to min guard throughout, Desats to 80% on RA and required 6L.min to maintain O2 sats >92%.           Stairs            Wheelchair Mobility    Modified Rankin (Stroke Patients Only)       Balance  Mild deficits observed. Needs RW for stability with gait.                                           Pertinent Vitals/Pain      Home Living Family/patient expects to be discharged to:: Private residence Living Arrangements: Spouse/significant other Available Help at Discharge: Family;Available 24 hours/day Type of Home: House Home Access: Stairs to enter Entrance Stairs-Rails: Right Entrance Stairs-Number of Steps: 2   Home Layout: One level Home Equipment: Cane - single point;Rollator (4 wheels)      Prior Function Prior Level of Function : Independent/Modified Independent                     Hand Dominance   Dominant Hand: Right    Extremity/Trunk Assessment  Upper Extremity Assessment Upper Extremity Assessment: Overall WFL for tasks assessed    Lower Extremity Assessment Lower Extremity Assessment: Overall WFL for tasks assessed    Cervical / Trunk Assessment Cervical / Trunk Assessment: Normal  Communication   Communication: No difficulties  Cognition Arousal/Alertness: Awake/alert Behavior During Therapy: WFL for tasks assessed/performed Overall Cognitive Status: Within Functional Limits for tasks assessed                                          General Comments      Exercises     Assessment/Plan    PT Assessment Patient needs  continued PT services  PT Problem List Decreased activity tolerance;Decreased balance;Decreased mobility;Decreased knowledge of use of DME;Decreased knowledge of precautions;Cardiopulmonary status limiting activity       PT Treatment Interventions DME instruction;Gait training;Stair training;Functional mobility training;Therapeutic activities;Therapeutic exercise;Balance training;Patient/family education    PT Goals (Current goals can be found in the Care Plan section)  Acute Rehab PT Goals Patient Stated Goal: get stronger, need less O2 PT Goal Formulation: With patient Time For Goal Achievement: 08/06/21 Potential to Achieve Goals: Good    Frequency Min 3X/week     Co-evaluation               AM-PAC PT "6 Clicks" Mobility  Outcome Measure Help needed turning from your back to your side while in a flat bed without using bedrails?: A Little Help needed moving from lying on your back to sitting on the side of a flat bed without using bedrails?: A Little Help needed moving to and from a bed to a chair (including a wheelchair)?: A Little Help needed standing up from a chair using your arms (e.g., wheelchair or bedside chair)?: A Little Help needed to walk in hospital room?: A Little Help needed climbing 3-5 steps with a railing? : A Little 6 Click Score: 18    End of Session Equipment Utilized During Treatment: Gait belt;Oxygen Activity Tolerance: Patient tolerated treatment well Patient left: in chair;with family/visitor present Nurse Communication: Mobility status PT Visit Diagnosis: Muscle weakness (generalized) (M62.81);Difficulty in walking, not elsewhere classified (R26.2);Other abnormalities of gait and mobility (R26.89)    Time: 1002-1040 PT Time Calculation (min) (ACUTE ONLY): 38 min   Charges:   PT Evaluation $PT Eval Moderate Complexity: 1 Mod PT Treatments $Gait Training: 8-22 mins $Self Care/Home Management: 8-22        Verner Mould, DPT Acute  Rehabilitation Services Office [phone] Pager [phone]   Jacques Navy 07/23/2021, 1:18 PM

## 2021-07-23 NOTE — Progress Notes (Signed)
SATURATION QUALIFICATIONS: (This note is used to comply with regulatory documentation for home oxygen)  Patient Saturations on Room Air at Rest = 88%  Patient Saturations on Room Air while Ambulating = 80%  Patient Saturations on 6 Liters of oxygen while Ambulating = 94%  Please briefly explain why patient needs home oxygen: Pt is unable to maintain SpO2 at sufficient level on RA at rest or with gait. Pt is able to maintain SpO2 of 94% at rest on 3L but desats to 86 with gait and required 6L/min to achieve 92% or greater.  Verner Mould, DPT Acute Rehabilitation Services Office [phone] Pager [phone]

## 2021-07-23 NOTE — Telephone Encounter (Signed)
I spoke to [facility] with [facility] and she is going to have the pharmacy team at the hospital discuss with the patient regarding his next dalvance infusion. I have also advised the patient that someone will be contacting him soon to let him know when he will be getting his next infusion. Per [name] patient will more than likely get the infusion once he is discharged from the hospital.  Luis Brown

## 2021-07-23 NOTE — TOC Progression Note (Signed)
Transition of Care [facility]) - Progression Note    Patient Details  Name: Luis Brown MRN: [id number] Date of Birth: 05-Feb-1946  Transition of Care [facility]) CM/SW Contact  Saki Legore, Juliann Pulse, RN Phone Number: 07/23/2021, 1:57 PM  Clinical Narrative: Noted pulmonary rehab eval on f/u of d/c.      Expected Discharge Plan: Home/Self Care Barriers to Discharge: Continued Medical Work up  Expected Discharge Plan and Services Expected Discharge Plan: Home/Self Care   Discharge Planning Services: CM Consult   Living arrangements for the past 2 months: Single Family Home                                       Social Determinants of Health (SDOH) Interventions    Readmission Risk Interventions Readmission Risk Prevention Plan 11/19/2020  Transportation Screening Complete  PCP or Specialist Appt within 5-7 Days Complete  Home Care Screening Complete  Medication Review (RN CM) Complete  Some recent data might be hidden

## 2021-07-23 NOTE — Progress Notes (Signed)
ANTICOAGULATION CONSULT NOTE   Pharmacy Consult for Heparin >> Dabigatran >> Enoxaparin Indication: pulmonary embolus  Allergies  Allergen Reactions   Daptomycin     Drug induced pneumonia     Patient Measurements: Height: 5\' 10"  (177.8 cm) Weight: 77.1 kg (170 lb) IBW/kg (Calculated) : 73 Heparin Dosing Weight: 77 kg  Vital Signs: Temp: 98.1 F (36.7 C) (01/06 1928) Temp Source: Oral (01/06 1928) BP: 143/79 (01/06 1928) Pulse Rate: 101 (01/06 1928)  Labs: Recent Labs    07/21/21 0439 07/22/21 0531 07/23/21 0456  HGB 11.4* 12.2*  --   HCT 34.6* 36.8*  --   PLT 359 390  --   HEPARINUNFRC 0.34  --   --   CREATININE 0.53* 0.67 0.70     Estimated Creatinine Clearance: 79.8 mL/min (by C-G formula based on SCr of 0.7 mg/dL).   Medications:  Scheduled:   amLODipine  5 mg Oral Daily   arformoterol  15 mcg Nebulization BID   And   umeclidinium bromide  1 puff Inhalation Daily   atorvastatin  10 mg Oral Daily   budesonide (PULMICORT) nebulizer solution  0.5 mg Nebulization BID   digoxin  0.125 mg Oral Daily   guaiFENesin  600 mg Oral BID   insulin aspart  0-20 Units Subcutaneous TID WC   insulin aspart  0-5 Units Subcutaneous QHS   insulin aspart  10 Units Subcutaneous TID WC   insulin glargine-yfgn  28 Units Subcutaneous Daily   mouth rinse  15 mL Mouth Rinse BID   pantoprazole  40 mg Oral Daily   [START ON 07/24/2021] predniSONE  40 mg Oral Q breakfast   pregabalin  75 mg Oral BID   tamsulosin  0.8 mg Oral Daily   venlafaxine XR  150 mg Oral Q breakfast   Infusions:   sodium chloride 10 mL/hr (07/19/21 0332)    Assessment: 76 yo male presents with SOB and cough, has had for the last 3 weeks but worse the last couple days.  History of Afib and DVT/PE, on xarelto.  Pharmacy consulted to dose heparin drip for PE.   Xarelto last dose reported on 12/28, but baseline HL 0.11 (12/30), and not elevated at baseline as would be expected on Xarelto.  OK to dose using  heparin level.    07/23/2021: Hematology recommends transitioning Xarelto to Pradaxa given suspected Xarelto failure Due to cost, patient will be unable to afford Pradaxa.  Plan to change Pradaxa to Lovenox, which patient will be discharged on.  Last dose of Pradaxa given 07/23/21 @ 21:25  Goal of Therapy:  Heparin level 0.3-0.7 units/ml Monitor platelets by anticoagulation protocol: Yes   Plan:  Lovenox 80mg  sq q12h (start 07/24/21 AM) Lovenox education prior to discharge  Leone Haven, PharmD 07/23/2021, 10:17 PM

## 2021-07-23 NOTE — Progress Notes (Signed)
Inpatient Diabetes Program Recommendations  AACE/ADA: New Consensus Statement on Inpatient Glycemic Control (2015)  Target Ranges:  Prepandial:   less than 140 mg/dL      Peak postprandial:   less than 180 mg/dL (1-2 hours)      Critically ill patients:  140 - 180 mg/dL    Latest Reference Range & Units 07/21/21 07:25 07/21/21 11:33 07/21/21 16:51 07/21/21 21:48 07/22/21 09:28  Glucose-Capillary 70 - 99 mg/dL 121 (H)  Novolog 13 units 264 (H)  Novolog 21 units 280 (H)  Novolog 21 units 371 (H)  Novolog 5 units 282 (H)    Latest Reference Range & Units 07/22/21 09:28 07/22/21 13:12 07/22/21 16:59 07/22/21 19:25 07/22/21 21:35 07/23/21 07:43  Glucose-Capillary 70 - 99 mg/dL 282 (H) 259 (H) 254 (H) 317 (H) 353 (H) 195 (H)   Home DM Meds: Januvia 50 mg daily      Metformin 1000 mg BID  Current Orders: Semglee 28 units daily     Novolog 10 units TID with meals     Novolog Resistant Correction Scale/ SSI (0-20 units) TID AC + HS  Solumedrol 80 mg daily  If steroid dose remains the same please consider:  1. Increase Novolog meal coverage to 14 units tid   --Will follow patient during hospitalization--  Tama Headings RN, MSN, BC-ADM Inpatient Diabetes Coordinator Team Pager [phone] (8a-5p)

## 2021-07-24 ENCOUNTER — Other Ambulatory Visit: Payer: Self-pay | Admitting: Medical

## 2021-07-24 DIAGNOSIS — G062 Extradural and subdural abscess, unspecified: Secondary | ICD-10-CM | POA: Diagnosis not present

## 2021-07-24 DIAGNOSIS — M4325 Fusion of spine, thoracolumbar region: Secondary | ICD-10-CM | POA: Diagnosis not present

## 2021-07-24 DIAGNOSIS — M4635 Infection of intervertebral disc (pyogenic), thoracolumbar region: Secondary | ICD-10-CM | POA: Diagnosis not present

## 2021-07-24 LAB — GLUCOSE, CAPILLARY: Glucose-Capillary: 112 mg/dL — ABNORMAL HIGH (ref 70–99)

## 2021-07-24 MED ORDER — ENOXAPARIN (LOVENOX) PATIENT EDUCATION KIT: PACK | Status: DC

## 2021-07-24 MED ORDER — ENOXAPARIN (LOVENOX) PATIENT EDUCATION KIT
PACK | Freq: Once | Status: AC
  Filled 2021-07-24: qty 1

## 2021-07-24 MED ORDER — ENOXAPARIN SODIUM 80 MG/0.8ML IJ SOSY: 80.0000 mg | PREFILLED_SYRINGE | Freq: Two times a day (BID) | INTRAMUSCULAR | 1 refills | Status: DC

## 2021-07-24 MED ORDER — PREDNISONE 10 MG PO TABS: ORAL_TABLET | ORAL | 0 refills | Status: AC

## 2021-07-24 NOTE — Progress Notes (Signed)
Physical Therapy Treatment Patient Details Name: Luis Brown MRN: [id number] DOB: 11/19/1945 Today's Date: 07/24/2021   History of Present Illness 76 year old man PMH COPD admitted with acute hypoxic respiratory failure, treated for pneumonia, right-sided pulmonary embolism.    PT Comments    Pt progressing toward goals. Incr activity tol, remains reliant on 6L O2 to retain sats in 90s with activity. At rest tol 2-3 L with adequate saturations. Discussed RPE, slowly increasing activity level at home, frequent rest periods advised. Pt and wife express understanding    Recommendations for follow up therapy are one component of a multi-disciplinary discharge planning process, led by the attending physician.  Recommendations may be updated based on patient status, additional functional criteria and insurance authorization.  Follow Up Recommendations  Other (comment) (Pulmonary rehab)     Assistance Recommended at Discharge Intermittent Supervision/Assistance  Patient can return home with the following Assistance with cooking/housework;Help with stairs or ramp for entrance   Equipment Recommendations  None recommended by PT (has RW)    Recommendations for Other Services       Precautions / Restrictions Precautions Precautions: Fall Precaution Comments: monitor sats Restrictions Weight Bearing Restrictions: No     Mobility  Bed Mobility Overal bed mobility: Modified Independent                  Transfers Overall transfer level: Needs assistance Equipment used: Rolling walker (2 wheels) Transfers: Sit to/from Stand Sit to Stand: Supervision           General transfer comment: for safety, no physical assist    Ambulation/Gait Ambulation/Gait assistance: Min guard;Supervision Gait Distance (Feet): 250 Feet Assistive device: Rolling walker (2 wheels) Gait Pattern/deviations: Step-through pattern;Decreased stride length       General Gait Details: cues for  posture, monitoring perceived exertion. SPO2=84% on RA, reliant on 6L to maintain sats in mid 90s with activity   Stairs             Wheelchair Mobility    Modified Rankin (Stroke Patients Only)       Balance                                            Cognition Arousal/Alertness: Awake/alert Behavior During Therapy: WFL for tasks assessed/performed Overall Cognitive Status: Within Functional Limits for tasks assessed                                          Exercises      General Comments        Pertinent Vitals/Pain Pain Assessment: No/denies pain    Home Living                          Prior Function            PT Goals (current goals can now be found in the care plan section) Acute Rehab PT Goals Patient Stated Goal: get stronger, need less O2 PT Goal Formulation: With patient Time For Goal Achievement: 08/06/21 Potential to Achieve Goals: Good Progress towards PT goals: Progressing toward goals    Frequency    Min 3X/week      PT Plan Current plan remains appropriate    Co-evaluation  AM-PAC PT "6 Clicks" Mobility   Outcome Measure  Help needed turning from your back to your side while in a flat bed without using bedrails?: A Little Help needed moving from lying on your back to sitting on the side of a flat bed without using bedrails?: None Help needed moving to and from a bed to a chair (including a wheelchair)?: A Little Help needed standing up from a chair using your arms (e.g., wheelchair or bedside chair)?: A Little Help needed to walk in hospital room?: A Little Help needed climbing 3-5 steps with a railing? : A Little 6 Click Score: 19    End of Session Equipment Utilized During Treatment: Gait belt;Oxygen Activity Tolerance: Patient tolerated treatment well Patient left: in bed;with call bell/phone within reach;with family/visitor present;with bed alarm set   PT  Visit Diagnosis: Muscle weakness (generalized) (M62.81);Difficulty in walking, not elsewhere classified (R26.2);Other abnormalities of gait and mobility (R26.89)     Time: 4287-6811 PT Time Calculation (min) (ACUTE ONLY): 24 min  Charges:  $Gait Training: 23-37 mins                     Baxter Flattery, PT  Acute Rehab Dept ([place]) [phone] Pager [phone]  07/24/2021    [facility] 07/24/2021, 11:58 AM

## 2021-07-24 NOTE — TOC Transition Note (Signed)
Transition of Care [facility]) - CM/SW Discharge Note   Patient Details  Name: Luis Brown MRN: [id number] Date of Birth: [birth year]  [facility] of [facility]) CM/SW Contact:  Illene Regulus, LCSW Phone Number: 07/24/2021, 1:31 PM   Clinical Narrative:     CSW confirmed with [facility] pt's O2 will be delivered prior to d/c .    Final next level of care: Home/Self Care Barriers to Discharge: Continued Medical Work up   Patient Goals and CMS Choice Patient states their goals for this hospitalization and ongoing recovery are:: go home CMS Medicare.gov Compare Post Acute Care list provided to:: Patient    Discharge Placement                       Discharge Plan and Services   Discharge Planning Services: CM Consult                                 Social Determinants of Health (SDOH) Interventions     Readmission Risk Interventions Readmission Risk Prevention Plan 11/19/2020  Transportation Screening Complete  PCP or Specialist Appt within 5-7 Days Complete  Home Care Screening Complete  Medication Review (RN CM) Complete  Some recent data might be hidden

## 2021-07-24 NOTE — Discharge Summary (Signed)
Physician Discharge Summary   Patient: Luis Brown MRN: [id number] DOB: 07/05/1946  Admit date:     07/15/2021  Discharge date: 07/24/21  Discharge Physician: Murray Hodgkins   PCP: Mackie Pai, PA-C   Recommendations at discharge:   New hypoxic respiratory failure thought secondary to interstitial lung disease New diagnosis PE, Xarelto failure, started on enoxaparin.  Patient desires to start warfarin, not started on discharge secondary to dosing with Pradaxa recently which would confuse baseline INR. Demand ischemia, secondary to acute illness.  Echocardiogram reassuring but CT did show left main and two-vessel coronary artery disease.  Clinical significance unclear.  Will refer to cardiology as an outpatient.  Discharge Diagnoses Principal Problem:   Acute respiratory failure with hypoxia (HCC) Active Problems:   Severe sepsis (HCC)   COPD with acute exacerbation (HCC)   CAP (community acquired pneumonia)   Pulmonary emboli ([place])   Diabetes mellitus without complication ([place])   Paroxysmal A-fib ([place])   Essential hypertension  Resolved Problems:   * No resolved hospital problems. [facility] Course   76 year old man PMH COPD admitted for acute hypoxic respiratory failure, sepsis secondary to pneumonia, acute pulmonary embolism.  Treated with standard therapy with some improvement but then decompensated with severe hypoxia and worsening infiltrate on chest x-ray.  Seen by pulmonology work-up for ILD.  Treated with steroids, eventually stabilized. Completed antibiotics.  Pulmonology recommended steroid taper on discharge.  Will require home oxygen.  Also noted to have PE, changed from Xarelto for treatment failure to Lovenox after discussion with hematology.  Pradaxa was not a medication the patient could afford.  Patient desires to start warfarin, but because he received Pradaxa during this hospitalization INR would be artifactually elevated.  Can be started in the  outpatient setting.  Assessed for home oxygen need, will discharge home on oxygen, new start.  Close outpatient follow-up with pulmonology and PCP.   Acute hypoxic respiratory failure secondary to ILD, pneumonia/pulmonary embolism/COPD exacerbation/concern for pneumonitis in the setting of ILD, present on admission. -- Continues to improve, will need home oxygen on discharge.  --Steroid taper as per pulmonology.  Follow-up with primary pulmonologist Dr. Chase Caller -- Antibiotics completed  Severe sepsis secondary to bilateral pneumonia, present on admission. --Sepsis symptomatology resolved.  Completed antibiotics. CT angiogram chest and high-resolution CT consistent with bilateral pneumonia left > right.  Acute segmental and subsegmental right-sided PE --2D echocardiogram without right ventricular strain.  No lower extremity DVT. Dr. Cyndia Skeeters discussed with hematology Dr. Lindi Adie who recommended Pradaxa and lupus anticoagulant panel.  Lupus anticoagulant panel was negative.   -- Finances prohibited Pradaxa.  Discussed with hematology, recommendation for Lovenox or warfarin.  Patient elected for Lovenox.  Would like to start warfarin as an outpatient.  History of hardware associated thoracic spine infections.  --Followed by Dr. Juleen China as outpatient.  On Dalvance as outpatient.  Next dose will be coordinated by [facility], dose was delivered to house yesterday.  COPD exacerbation -- Resolved.  Continue steriod, bronchodilators on discharge.   Demand ischemia --echo normal nl LVEF.  No regional wall motion abnormality.  No further evaluation suggested.  Paroxysmal atrial fibrillation --Continue digoxin  Uncontrolled type 2 diabetes mellitus with hyperglycemia --Hemoglobin A1c 7.4 (07/15/2021).  Secondary to steroids.  -- Resume oral medications, now on steroid taper expect blood sugars to improve.  On CT: Aortic atherosclerosis, in addition to left main and 2 vessel coronary artery disease.  Assessment for potential risk factor modification, dietary therapy or pharmacologic therapy may be  warranted, if clinically indicated.   Aortic Atherosclerosis (ICD10-I70.0) and Emphysema (ICD10-J43.9).  No notes have been filed under this hospital service. Service: Hospitalist       Consultants: Pulmonology  Procedures performed: none  Disposition: Home Diet recommendation: Regular diet  DISCHARGE MEDICATION: Allergies as of 07/24/2021       Reactions   Daptomycin    Drug induced pneumonia         Medication List     STOP taking these medications    cefdinir 300 MG capsule Commonly known as: OMNICEF   metoprolol tartrate 100 MG tablet Commonly known as: LOPRESSOR   rivaroxaban 20 MG Tabs tablet Commonly known as: XARELTO       TAKE these medications    albuterol 108 (90 Base) MCG/ACT inhaler Commonly known as: Proventil HFA TAKE 2 PUFFS BY MOUTH EVERY 6 HOURS AS NEEDED FOR WHEEZE OR SHORTNESS OF BREATH What changed:  how much to take when to take this reasons to take this   atorvastatin 10 MG tablet Commonly known as: LIPITOR TAKE 1 TABLET BY MOUTH EVERY DAY   Contour Next Test test strip Generic drug: glucose blood Check blood sugar once daily   Dalvance 500 MG Solr Generic drug: dalbavancin Inject 500 mg into the vein every 14 (fourteen) days. Pt states this is a every 2 week antibiotic. Could not state how long therapy should be.   digoxin 0.125 MG tablet Commonly known as: LANOXIN Take 1 tablet (0.125 mg total) by mouth daily.   enoxaparin 80 MG/0.8ML injection Commonly known as: LOVENOX Inject 0.8 mLs (80 mg total) into the skin every 12 (twelve) hours.   enoxaparin Kit Commonly known as: LOVENOX Dispense Lovenox education kit   Ferretts 325 (106 Fe) MG Tabs tablet Generic drug: ferrous fumarate Take 1 tablet (106 mg of iron total) by mouth daily.   metFORMIN 1000 MG tablet Commonly known as: GLUCOPHAGE TAKE 1 TABLET (1,000 MG  TOTAL) BY MOUTH 2 (TWO) TIMES DAILY WITH A MEAL.   oxyCODONE-acetaminophen 5-325 MG tablet Commonly known as: PERCOCET/ROXICET Take 1 tablet by mouth every 4 (four) hours as needed.   pantoprazole 40 MG tablet Commonly known as: PROTONIX TAKE 1 TABLET BY MOUTH TWICE A DAY BEFORE A MEAL What changed: See the new instructions.   predniSONE 10 MG tablet Commonly known as: DELTASONE Take 4 tablets (40 mg total) by mouth daily with breakfast for 10 days, THEN 3 tablets (30 mg total) daily with breakfast for 10 days, THEN 2 tablets (20 mg total) daily with breakfast for 10 days, THEN 1 tablet (10 mg total) daily with breakfast for 10 days. Start taking on: July 24, 2021 What changed: See the new instructions.   pregabalin 75 MG capsule Commonly known as: LYRICA TAKE 1 CAPSULE BY MOUTH TWICE A DAY   sitaGLIPtin 50 MG tablet Commonly known as: Januvia Take 1 tablet (50 mg total) by mouth daily.   Stiolto Respimat 2.5-2.5 MCG/ACT Aers Generic drug: Tiotropium Bromide-Olodaterol Inhale 2 puffs into the lungs daily.   tamsulosin 0.4 MG Caps capsule Commonly known as: FLOMAX Take 2 capsules (0.8 mg total) by mouth daily.   testosterone cypionate 200 MG/ML injection Commonly known as: DEPOTESTOSTERONE CYPIONATE INJECT 1 ML (200 MG TOTAL) INTO THE MUSCLE EVERY 14 DAYS What changed: See the new instructions.   tiZANidine 4 MG tablet Commonly known as: ZANAFLEX TAKE 1.5 TABLETS (6 MG TOTAL) BY MOUTH EVERY 6 (SIX) HOURS AS NEEDED FOR MUSCLE SPASMS.   venlafaxine XR  150 MG 24 hr capsule Commonly known as: EFFEXOR-XR TAKE 1 CAPSULE BY MOUTH DAILY WITH BREAKFAST. What changed: how to take this   zolpidem 5 MG tablet Commonly known as: AMBIEN Take 1 tablet (5 mg total) by mouth at bedtime as needed. for sleep               Durable Medical Equipment  (From admission, onward)           Start     Ordered   07/24/21 1427  For home use only DME oxygen  Once       Question  Answer Comment  Length of Need Lifetime   Mode or (Route) Nasal cannula   Liters per Minute 6   Frequency Continuous (stationary and portable oxygen unit needed)   Oxygen delivery system Gas      07/24/21 1427            Follow-up Information     Brand Males, MD Follow up on 08/12/2021.   Specialty: Pulmonary Disease Why: at 2pm please eval for pulmonary rehab. Contact information: [address] [place] [address] [place], [place] [facility] Follow up.   Contact information: [address]  [address] [phone]                Feels better, breathing fine, ambulating well, ready for discharge Discharge Exam: Filed Weights   07/15/21 1625  Weight: 77.1 kg   Physical Exam Vitals reviewed.  Constitutional:      General: He is not in acute distress.    Appearance: He is not ill-appearing or toxic-appearing.  Cardiovascular:     Rate and Rhythm: Normal rate and regular rhythm.     Heart sounds: No murmur heard. Pulmonary:     Effort: Pulmonary effort is normal. No respiratory distress.     Breath sounds: No wheezing, rhonchi or rales.     Comments: Able to speak in long paragraphs without apparent effort Neurological:     Mental Status: He is alert.  Psychiatric:        Mood and Affect: Mood normal.        Behavior: Behavior normal.     Condition at discharge: good  The results of significant diagnostics from this hospitalization (including imaging, microbiology, ancillary and laboratory) are listed below for reference.   Imaging Studies: DG Chest 2 View  Result Date: 07/15/2021 CLINICAL DATA:  Short of breath.  Hypoxemia. EXAM: CHEST - 2 VIEW COMPARISON:  02/09/2021 FINDINGS: Extensive asymmetric airspace disease throughout the left upper lobe and left lower lobe. Small amount of airspace disease in the right lateral lung base. No pleural effusion. Heart size normal.  Vascularity  normal Extensive thoracic and lumbar fusion hardware. IMPRESSION: Extensive asymmetric airspace disease on the left and a small amount of airspace disease in the right. Probable pneumonia Electronically Signed   By: Franchot Gallo M.D.   On: 07/15/2021 18:26   CT Angio Chest Pulmonary Embolism (PE) W or WO Contrast  Result Date: 07/16/2021 CLINICAL DATA:  Pulmonary embolism suspected.  High probability. EXAM: CT ANGIOGRAPHY CHEST WITH CONTRAST TECHNIQUE: Multidetector CT imaging of the chest was performed using the standard protocol during bolus administration of intravenous contrast. Multiplanar CT image reconstructions and MIPs were obtained to evaluate the vascular anatomy. CONTRAST:  30mL OMNIPAQUE IOHEXOL 350 MG/ML SOLN COMPARISON:  PA and lateral chest today, chest film 02/09/2021, chest CT without  contrast 12/11/2020 and CTA chest 11/10/2020. FINDINGS: Cardiovascular: There is mild cardiomegaly with right chamber predominance, RV/LV ratio 1.2 indicating likely right heart strain and prominence of the pulmonary trunk to 3.3 cm. There are segmental and subsegmental occlusive emboli in the right lower lobe posterior base and in the right upper lobe apical and posterior segments. There may questionably be small-vessel emboli in the right middle lobe periphery but this is difficult to assess due to breathing motion. There is no pericardial effusion. There are scattered calcifications in the LAD and circumflex coronary arteries thoracic aorta. There is no aortic dissection or aneurysm. The great vessels opacify normally. Mediastinum/Nodes: There are mildly enlarged left-sided pre-vascular lymph nodes compared to the last 2 studies, for example a lymph node measuring 1.1 cm in short axis on series 4 axial 57, prominent subcarinal nodal complex now measuring 1.5 cm in short axis on axial 74, and decreased prominence of right hilar lymph nodes, largest of these 1.4 cm in short axis on axial 67. No thyroid or  esophageal mass is seen. Lungs/Pleura: Extensive interstitial and ground-glass consolidation in the left lung is noted with denser patchy consolidation in the upper half of the left upper lobe. Background mild centrilobular and paraseptal emphysematous change in the upper lobes. There are much less dense faint patchy ground-glass opacities in the right lower lobe probably post pneumonic scarring and similar to the last CT, with new areas of denser patchy ground-glass infiltrate in the anterior segment of the right upper lobe. There are increased small cystic spaces in the consolidation primarily within the left upper lobe and superior segment left lower lobe concerning for a necrotizing pneumonic process. No pulmonary abscess is seen. There are only trace pleural effusions and no loculated collection. Relatively small caliber in the central airways may suggest reactive airways disease or bronchospasm and there is mild bronchial thickening in both lower lobes. Upper Abdomen: No acute abnormality. Musculoskeletal: Additional comparison is made to a thoracic spine CT of 03/26/2021. Extensive posterior spinal fusion hardware is again noted, evidence of previously removed pedicle screws at T5, T6 and T10 with posterior rods and pedicle screws again beginning at the level of T7. 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 Evidence of prior spondylodiscitis and vertebral body ankylosis again noted T4-5 and T9-10. No new endplate erosive disease is seen. Bilateral paravertebral soft tissue prominence along the mid to lower thoracic spine is similar. Previously described as likely phlegmon. There are no paraspinal fluid collections. Review of the MIP images confirms the above findings. IMPRESSION: 1. Segmental and subsegmental arterial emboli in the right upper and lower lobes.  The overall clot burden appears small to moderate. There may be additional small vessel emboli in the right middle lobe, difficult to confirm given respiratory motion. 2. Cardiomegaly with a right chamber predominance indicating right heart strain with prominent pulmonary trunk and no further visible emboli. 3. Interstitial and extensive ground-glass and patchy consolidative change in the left lung with scattered ground-glass infiltrates in the anterior segment of the right upper lobe. Background COPD. Probable multi lobar pneumonia, previously seen in different locations with similar appearance. 4. Increased small cystic spaces in the left lung consolidation concerning for necrotizing pneumonic component. Follow-up study recommended after treatment. 5. Increased mildly enlarged mediastinal and right hilar lymph nodes likely reactive. 6. Aortic and coronary artery atherosclerosis. 7. 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 Paraspinal soft tissue fullness appear similar. Electronically Signed   By: Telford Nab M.D.   On: 07/16/2021 00:16   CT Chest High Resolution  Result Date: 07/18/2021 CLINICAL DATA:  76 year old male with clinical suspicion of interstitial lung disease. EXAM: CT CHEST WITHOUT CONTRAST TECHNIQUE: Multidetector CT imaging of the chest was performed following the standard protocol without intravenous contrast. High resolution imaging of the lungs, as well as inspiratory and expiratory imaging, was performed. COMPARISON:  Chest CTA 07/15/2021. FINDINGS: Cardiovascular: Heart size is normal. There is no significant pericardial fluid, thickening or pericardial calcification. There is aortic atherosclerosis, as well as atherosclerosis of the great vessels of the mediastinum and the coronary arteries, including calcified atherosclerotic  plaque in the left main, left anterior descending and left circumflex coronary arteries. Mediastinum/Nodes: No pathologically enlarged mediastinal or hilar lymph nodes. Please note that accurate exclusion of hilar adenopathy is limited on noncontrast CT scans. Esophagus is unremarkable in appearance. No axillary lymphadenopathy. Lungs/Pleura: Patchy areas of ground-glass attenuation, septal thickening, thickening of the peribronchovascular interstitium and patchy peripheral predominant areas of airspace consolidation are noted in the lungs bilaterally (left-greater-than-right), most compatible with severe multilobar bilateral pneumonia. Near complete sparing of the right middle lobe is noted. Scattered areas of cylindrical bronchiectasis are noted. Inspiratory and expiratory imaging is unremarkable. Trace left pleural effusion. No definite suspicious appearing pulmonary nodules or masses are noted. Diffuse bronchial wall thickening with mild centrilobular and paraseptal emphysema. Upper Abdomen: Aortic atherosclerosis. Musculoskeletal: There are no aggressive appearing lytic or blastic lesions noted in the visualized portions of the skeleton. Posterior rod and screw fixation device in place throughout the thoracolumbar spine. IMPRESSION: 1. The appearance of the lungs is most compatible with severe multilobar bilateral pneumonia, as above. 2. Diffuse bronchial wall thickening with mild centrilobular and paraseptal emphysema; imaging findings suggestive of underlying COPD. 3. Aortic atherosclerosis, in addition to left main and 2 vessel coronary artery disease. Assessment for potential risk factor modification, dietary therapy or pharmacologic therapy may be warranted, if clinically indicated. Aortic Atherosclerosis (ICD10-I70.0) and Emphysema (ICD10-J43.9). Electronically Signed   By: Vinnie Langton M.D.   On: 07/18/2021 11:38   DG Chest Port 1 View  Result Date: 07/22/2021 CLINICAL DATA:  Pneumonitis EXAM:  PORTABLE CHEST 1 VIEW COMPARISON:  07/17/2021 FINDINGS: Extensive infiltrate throughout the left lung with interval progression. Minimal left effusion Mild airspace disease on the right has improved. No right effusion. Negative for heart failure Extensive thoracolumbar fusion hardware. IMPRESSION: Extensive infiltrate in the left lung with progression. Mild improvement in right-sided infiltrate. Probable pneumonia. Electronically Signed   By: Franchot Gallo M.D.   On: 07/22/2021 10:27   DG Chest Port 1 View  Result Date: 07/17/2021 CLINICAL DATA:  76 year old male with increasing shortness of breath. EXAM: PORTABLE CHEST 1 VIEW COMPARISON:  Chest x-ray 07/15/2021. FINDINGS: Widespread areas of interstitial prominence and patchy asymmetrically distributed airspace disease is again noted in the lungs bilaterally (left greater than right). Compared to the prior examination, there is worsening aeration in the right upper lobe. No definite pleural effusions. No pneumothorax. No evidence of pulmonary edema. Heart size is normal. Upper mediastinal contours are within normal limits. Atherosclerotic calcifications are noted in the thoracic aorta. Orthopedic rod and screw fixation hardware noted throughout the thoracolumbar spine. IMPRESSION: 1. Worsening multilobar bilateral pneumonia, as above. 2. Aortic atherosclerosis. Electronically Signed   By: Quillian Quince  Entrikin M.D.   On: 07/17/2021 10:15   ECHOCARDIOGRAM COMPLETE  Result Date: 07/16/2021    ECHOCARDIOGRAM REPORT   Patient Name:   JHONATHAN DESROCHES Date of Exam: 07/16/2021 Medical Rec #:  [id number]        Height:       70.0 in Accession #:    [id number]       Weight:       170.0 lb Date of Birth:  12-10-1945         BSA:          1.948 m Patient Age:    53 years         BP:           105/65 mmHg Patient Gender: M                HR:           71 bpm. Exam Location:  Inpatient Procedure: 2D Echo, Cardiac Doppler and Color Doppler Indications:    Pulmonary embolus   History:        Patient has prior history of Echocardiogram examinations. COPD,                 Arrythmias:Atrial Fibrillation; Risk Factors:Hypertension and                 Diabetes.  Sonographer:    Jyl Heinz Referring Phys: [place]  1. Left ventricular ejection fraction, by estimation, is 60 to 65%. The left ventricle has normal function. The left ventricle has no regional wall motion abnormalities. Left ventricular diastolic parameters were normal.  2. Right ventricular systolic function is normal. The right ventricular size is normal. Tricuspid regurgitation signal is inadequate for assessing PA pressure.  3. The mitral valve is grossly normal. Trivial mitral valve regurgitation. No evidence of mitral stenosis.  4. The aortic valve is tricuspid. Aortic valve regurgitation is not visualized. No aortic stenosis is present.  5. The inferior vena cava is normal in size with greater than 50% respiratory variability, suggesting right atrial pressure of 3 mmHg. Conclusion(s)/Recommendation(s): Normal biventricular function without evidence of hemodynamically significant valvular heart disease. FINDINGS  Left Ventricle: Left ventricular ejection fraction, by estimation, is 60 to 65%. The left ventricle has normal function. The left ventricle has no regional wall motion abnormalities. The left ventricular internal cavity size was normal in size. There is  no left ventricular hypertrophy. Left ventricular diastolic parameters were normal. Right Ventricle: The right ventricular size is normal. No increase in right ventricular wall thickness. Right ventricular systolic function is normal. Tricuspid regurgitation signal is inadequate for assessing PA pressure. Left Atrium: Left atrial size was normal in size. Right Atrium: Right atrial size was normal in size. Pericardium: There is no evidence of pericardial effusion. Mitral Valve: The mitral valve is grossly normal. Trivial mitral valve  regurgitation. No evidence of mitral valve stenosis. Tricuspid Valve: The tricuspid valve is grossly normal. Tricuspid valve regurgitation is trivial. No evidence of tricuspid stenosis. Aortic Valve: The aortic valve is tricuspid. Aortic valve regurgitation is not visualized. No aortic stenosis is present. Aortic valve peak gradient measures 12.4 mmHg. Pulmonic Valve: The pulmonic valve was grossly normal. Pulmonic valve regurgitation is trivial. No evidence of pulmonic stenosis. Aorta: The aortic root and ascending aorta are structurally normal, with no evidence of dilitation. Venous: The inferior vena cava is normal in size with greater than 50% respiratory variability, suggesting right atrial pressure of 3 mmHg. IAS/Shunts: The atrial  septum is grossly normal.  LEFT VENTRICLE PLAX 2D LVIDd:         4.80 cm      Diastology LVIDs:         3.00 cm      LV e' medial:    6.96 cm/s LV PW:         1.10 cm      LV E/e' medial:  10.1 LV IVS:        1.10 cm      LV e' lateral:   7.72 cm/s LVOT diam:     2.10 cm      LV E/e' lateral: 9.1 LV SV:         88 LV SV Index:   45 LVOT Area:     3.46 cm  LV Volumes (MOD) LV vol d, MOD A2C: 118.0 ml LV vol d, MOD A4C: 106.0 ml LV vol s, MOD A2C: 44.1 ml LV vol s, MOD A4C: 38.3 ml LV SV MOD A2C:     73.9 ml LV SV MOD A4C:     106.0 ml LV SV MOD BP:      71.2 ml RIGHT VENTRICLE             IVC RV Basal diam:  3.70 cm     IVC diam: 1.20 cm RV Mid diam:    2.90 cm RV S prime:     13.80 cm/s TAPSE (M-mode): 2.6 cm LEFT ATRIUM             Index        RIGHT ATRIUM           Index LA diam:        3.80 cm 1.95 cm/m   RA Area:     15.30 cm LA Vol (A2C):   47.2 ml 24.23 ml/m  RA Volume:   39.90 ml  20.48 ml/m LA Vol (A4C):   46.4 ml 23.82 ml/m LA Biplane Vol: 50.1 ml 25.72 ml/m  AORTIC VALVE AV Area (Vmax): 2.40 cm AV Vmax:        176.00 cm/s AV Peak Grad:   12.4 mmHg LVOT Vmax:      122.00 cm/s LVOT Vmean:     83.500 cm/s LVOT VTI:       0.255 m  AORTA Ao Root diam: 3.10 cm Ao Asc  diam:  3.00 cm MITRAL VALVE MV Area (PHT): 3.65 cm    SHUNTS MV Decel Time: 208 msec    Systemic VTI:  0.26 m MV E velocity: 70.60 cm/s  Systemic Diam: 2.10 cm MV A velocity: 85.90 cm/s MV E/A ratio:  0.82 Eleonore Chiquito MD Electronically signed by Eleonore Chiquito MD Signature Date/Time: 07/16/2021/12:45:35 PM    Final    VAS [place] LOWER EXTREMITY VENOUS (DVT)  Result Date: 07/16/2021  Lower Venous DVT Study Patient Name:  KANON NOVOSEL  Date of Exam:   07/16/2021 Medical Rec #: [id number]         Accession #:    [id number] Date of Birth: [birth year]          Patient Gender: M Patient Age:   56 years Exam Location:  [facility] Procedure:      VAS [place] LOWER EXTREMITY VENOUS (DVT) Referring Phys: Nyoka Lint DOUTOVA --------------------------------------------------------------------------------  Indications: Pulmonary embolism.  Risk Factors: Confirmed PE. Anticoagulation: Heparin. Comparison Study: No prior studies. Performing Technologist: Oliver Hum RVT  Examination Guidelines: A complete evaluation includes B-mode imaging, spectral Doppler, color Doppler, and  power Doppler as needed of all accessible portions of each vessel. Bilateral testing is considered an integral part of a complete examination. Limited examinations for reoccurring indications may be performed as noted. The reflux portion of the exam is performed with the patient in reverse Trendelenburg.  +---------+---------------+---------+-----------+----------+--------------+  RIGHT     Compressibility Phasicity Spontaneity Properties Thrombus Aging  +---------+---------------+---------+-----------+----------+--------------+  CFV       Full            Yes       Yes                                    +---------+---------------+---------+-----------+----------+--------------+  SFJ       Full                                                             +---------+---------------+---------+-----------+----------+--------------+  FV Prox   Full                                                              +---------+---------------+---------+-----------+----------+--------------+  FV Mid    Full                                                             +---------+---------------+---------+-----------+----------+--------------+  FV Distal Full                                                             +---------+---------------+---------+-----------+----------+--------------+  PFV       Full                                                             +---------+---------------+---------+-----------+----------+--------------+  POP       Full            Yes       Yes                                    +---------+---------------+---------+-----------+----------+--------------+  PTV       Full                                                             +---------+---------------+---------+-----------+----------+--------------+  PERO      Full                                                             +---------+---------------+---------+-----------+----------+--------------+   +---------+---------------+---------+-----------+----------+--------------+  LEFT      Compressibility Phasicity Spontaneity Properties Thrombus Aging  +---------+---------------+---------+-----------+----------+--------------+  CFV       Full            Yes       Yes                                    +---------+---------------+---------+-----------+----------+--------------+  SFJ       Full                                                             +---------+---------------+---------+-----------+----------+--------------+  FV Prox   Full                                                             +---------+---------------+---------+-----------+----------+--------------+  FV Mid    Full                                                             +---------+---------------+---------+-----------+----------+--------------+  FV Distal Full                                                              +---------+---------------+---------+-----------+----------+--------------+  PFV       Full                                                             +---------+---------------+---------+-----------+----------+--------------+  POP       Full            Yes       Yes                                    +---------+---------------+---------+-----------+----------+--------------+  PTV       Full                                                             +---------+---------------+---------+-----------+----------+--------------+  PERO      Full                                                             +---------+---------------+---------+-----------+----------+--------------+     Summary: RIGHT: - There is no evidence of deep vein thrombosis in the lower extremity.  - No cystic structure found in the popliteal fossa.  LEFT: - There is no evidence of deep vein thrombosis in the lower extremity.  - No cystic structure found in the popliteal fossa.  *See table(s) above for measurements and observations. Electronically signed by Monica Martinez MD on 07/16/2021 at 12:36:24 PM.    Final    ECHOCARDIOGRAM LIMITED  Result Date: 07/20/2021    ECHOCARDIOGRAM LIMITED REPORT   Patient Name:   RAEDYN WENKE Date of Exam: 07/20/2021 Medical Rec #:  [id number]        Height:       70.0 in Accession #:    [id number]       Weight:       170.0 lb Date of Birth:  11-25-1945         BSA:          1.948 m Patient Age:    80 years         BP:           142/94 mmHg Patient Gender: M                HR:           102 bpm. Exam Location:  Inpatient Procedure: Limited Echo, Cardiac Doppler and Color Doppler Indications:    Pulmonary Embolus I26.09  History:        Patient has prior history of Echocardiogram examinations, most                 recent 07/16/2021. Arrythmias:Atrial Fibrillation; Risk                 Factors:Hypertension, Diabetes and Dyslipidemia.  Sonographer:    Darlina Sicilian RDCS Referring Phys: [place]  1. Left ventricular ejection fraction, by estimation, is 60 to 65%. The left ventricle has normal function. The left ventricle has no regional wall motion abnormalities. Left ventricular diastolic parameters are indeterminate.  2. Right ventricular systolic function is normal. The right ventricular size is normal. There is normal pulmonary artery systolic pressure.  3. The mitral valve is normal in structure. No evidence of mitral valve regurgitation. No evidence of mitral stenosis.  4. The aortic valve is tricuspid. Aortic valve regurgitation is not visualized. No aortic stenosis is present.  5. The inferior vena cava is normal in size with greater than 50% respiratory variability, suggesting right atrial pressure of 3 mmHg. Comparison(s): A prior study was performed on 12/30.2022. No significant change from prior study. FINDINGS  Left Ventricle: Left ventricular ejection fraction, by estimation, is 60 to 65%. The left ventricle has normal function. The left ventricle has no regional wall motion abnormalities. The left ventricular internal cavity size was normal in size. There is  no concentric left ventricular hypertrophy. Left ventricular diastolic parameters are indeterminate. Right Ventricle: The right ventricular size is normal. No increase in right ventricular wall thickness. Right ventricular systolic function is normal. There is normal pulmonary artery systolic  pressure. The tricuspid regurgitant velocity is 2.39 m/s, and  with an assumed right atrial pressure of 3 mmHg, the estimated right ventricular systolic pressure is 65.7 mmHg. Left Atrium: Left atrial size was normal in size. Right Atrium: Right atrial size was normal in size. Pericardium: There is no evidence of pericardial effusion. Presence of epicardial fat layer. Mitral Valve: The mitral valve is normal in structure. Mild mitral annular calcification. No evidence of mitral valve stenosis. Tricuspid Valve: The tricuspid  valve is normal in structure. Tricuspid valve regurgitation is trivial. No evidence of tricuspid stenosis. Aortic Valve: The aortic valve is tricuspid. Aortic valve regurgitation is not visualized. No aortic stenosis is present. Pulmonic Valve: The pulmonic valve was normal in structure. Pulmonic valve regurgitation is not visualized. No evidence of pulmonic stenosis. Aorta: The aortic root is normal in size and structure. Venous: The inferior vena cava is normal in size with greater than 50% respiratory variability, suggesting right atrial pressure of 3 mmHg. IAS/Shunts: No atrial level shunt detected by color flow Doppler. LEFT VENTRICLE PLAX 2D LVIDd:         3.90 cm LV PW:         1.03 cm LV IVS:        1.01 cm  TRICUSPID VALVE TR Peak grad:   22.8 mmHg TR Vmax:        239.00 cm/s Godfrey Pick Tobb DO Electronically signed by Berniece Salines DO Signature Date/Time: 07/20/2021/3:19:44 PM    Final     Microbiology: Results for orders placed or performed during the hospital encounter of 07/15/21  Blood Culture (routine x 2)     Status: None   Collection Time: 07/15/21  5:01 PM   Specimen: BLOOD  Result Value Ref Range Status   Specimen Description   Final    BLOOD RIGHT ANTECUBITAL Performed at [facility] [address], [place], [place] [address]    Special Requests   Final    BOTTLES DRAWN AEROBIC AND ANAEROBIC Blood Culture adequate volume Performed at [facility] [address], [place], [place] [address]    Culture   Final    NO GROWTH 5 DAYS Performed at [facility], [place] [address] [place], [place] [address]    Report Status 07/20/2021 FINAL  Final  Resp Panel by RT-PCR (Flu A&B, Covid) Nasopharyngeal Swab     Status: None   Collection Time: 07/15/21  5:17 PM   Specimen: Nasopharyngeal Swab; Nasopharyngeal(NP) swabs in vial transport medium  Result Value Ref Range Status   SARS Coronavirus 2 by RT PCR NEGATIVE NEGATIVE Final    Comment:  (NOTE) SARS-CoV-2 target nucleic acids are NOT DETECTED.  The SARS-CoV-2 RNA is generally detectable in upper respiratory specimens during the acute phase of infection. The lowest concentration of SARS-CoV-2 viral copies this assay can detect is 138 copies/mL. A negative result does not preclude SARS-Cov-2 infection and should not be used as the sole basis for treatment or other patient management decisions. A negative result may occur with  improper specimen collection/handling, submission of specimen other than nasopharyngeal swab, presence of viral mutation(s) within the areas targeted by this assay, and inadequate number of viral copies(<138 copies/mL). A negative result must be combined with clinical observations, patient history, and epidemiological information. The expected result is Negative.  Fact Sheet for Patients:  EntrepreneurPulse.com.au  Fact Sheet for Healthcare Providers:  IncredibleEmployment.be  This test is no t yet approved or cleared by the [place] FDA  and  has been authorized for detection and/or diagnosis of SARS-CoV-2 by FDA under an Emergency Use Authorization (EUA). This EUA will remain  in effect (meaning this test can be used) for the duration of the COVID-19 declaration under Section 564(b)(1) of the Act, 21 U.S.C.section 360bbb-3(b)(1), unless the authorization is terminated  or revoked sooner.       Influenza A by PCR NEGATIVE NEGATIVE Final   Influenza B by PCR NEGATIVE NEGATIVE Final    Comment: (NOTE) The Xpert Xpress SARS-CoV-2/FLU/RSV plus assay is intended as an aid in the diagnosis of influenza from Nasopharyngeal swab specimens and should not be used as a sole basis for treatment. Nasal washings and aspirates are unacceptable for Xpert Xpress SARS-CoV-2/FLU/RSV testing.  Fact Sheet for Patients: BloggerCourse.com  Fact Sheet for Healthcare  Providers: SeriousBroker.it  This test is not yet approved or cleared by the [place] FDA and has been authorized for detection and/or diagnosis of SARS-CoV-2 by FDA under an Emergency Use Authorization (EUA). This EUA will remain in effect (meaning this test can be used) for the duration of the COVID-19 declaration under Section 564(b)(1) of the Act, 21 U.S.C. section 360bbb-3(b)(1), unless the authorization is terminated or revoked.  Performed at [facility], [address] [place], [place] [address]   Blood Culture (routine x 2)     Status: None   Collection Time: 07/15/21  5:19 PM   Specimen: BLOOD  Result Value Ref Range Status   Specimen Description   Final    BLOOD RIGHT WRIST Performed at [facility], [address], [place], [place] [address]    Special Requests   Final    BOTTLES DRAWN AEROBIC AND ANAEROBIC Blood Culture adequate volume Performed at [facility], [address], [place], [place] [address]    Culture   Final    NO GROWTH 5 DAYS Performed at [facility], [address], [place], [place] [address]    Report Status 07/20/2021 FINAL  Final  Respiratory (~20 pathogens) panel by PCR     Status: None   Collection Time: 07/15/21 10:25 PM   Specimen: Nasopharyngeal Swab; Respiratory  Result Value Ref Range Status   Adenovirus NOT DETECTED NOT DETECTED Final   Coronavirus 229E NOT DETECTED NOT DETECTED Final    Comment: (NOTE) The Coronavirus on the Respiratory Panel, DOES NOT test for the novel  Coronavirus (2019 nCoV)    Coronavirus HKU1 NOT DETECTED NOT DETECTED Final   Coronavirus NL63 NOT DETECTED NOT DETECTED Final   Coronavirus OC43 NOT DETECTED NOT DETECTED Final   Metapneumovirus NOT DETECTED NOT DETECTED Final   Rhinovirus / Enterovirus NOT DETECTED NOT DETECTED Final   Influenza A NOT DETECTED NOT DETECTED Final   Influenza B NOT DETECTED NOT  DETECTED Final   Parainfluenza Virus 1 NOT DETECTED NOT DETECTED Final   Parainfluenza Virus 2 NOT DETECTED NOT DETECTED Final   Parainfluenza Virus 3 NOT DETECTED NOT DETECTED Final   Parainfluenza Virus 4 NOT DETECTED NOT DETECTED Final   Respiratory Syncytial Virus NOT DETECTED NOT DETECTED Final   Bordetella pertussis NOT DETECTED NOT DETECTED Final   Bordetella Parapertussis NOT DETECTED NOT DETECTED Final   Chlamydophila pneumoniae NOT DETECTED NOT DETECTED Final   Mycoplasma pneumoniae NOT DETECTED NOT DETECTED Final    Comment: Performed at [facility], [address], [place], [place] [address]  MRSA Next Gen by PCR, Nasal     Status: None  Collection Time: 07/17/21  2:40 PM   Specimen: Nasal Mucosa; Nasal Swab  Result Value Ref Range Status   MRSA by PCR Next Gen NOT DETECTED NOT DETECTED Final    Comment: (NOTE) The GeneXpert MRSA Assay (FDA approved for NASAL specimens only), is one component of a comprehensive MRSA colonization surveillance program. It is not intended to diagnose MRSA infection nor to guide or monitor treatment for MRSA infections. Test performance is not FDA approved in patients less than 61 years old. Performed at [facility], [place] [address], [place], [place] [address]    *Note: Due to a large number of results and/or encounters for the requested time period, some results have not been displayed. A complete set of results can be found in Results Review.    Labs: CBC: Recent Labs  Lab 07/18/21 0511 07/19/21 0447 07/20/21 0414 07/21/21 0439 07/22/21 0531  WBC 15.0* 14.0* 11.8* 10.1 9.1  HGB 11.0* 11.9* 11.7* 11.4* 12.2*  HCT 33.6* 36.3* 34.8* 34.6* 36.8*  MCV 91.6 92.1 88.8 89.6 90.9  PLT 300 347 353 359 567   Basic Metabolic Panel: Recent Labs  Lab 07/19/21 0935 07/20/21 0414 07/21/21 0439 07/22/21 0531 07/23/21 0456  NA 134* 134* 135 134* 133*  K 4.3 4.1 3.6 4.1 4.0  CL 103 97* 98 98 96*  CO2 $Re'26 28 30  30 28  'woe$ GLUCOSE 249* 285* 140* 240* 251*  BUN 25* $Remov'20 21 21 21  'taYTle$ CREATININE 0.61 0.60* 0.53* 0.67 0.70  CALCIUM 8.3* 8.0* 7.8* 8.3* 8.4*  MG 2.2  --  2.0 2.2  --   PHOS  --   --  2.6  --   --    Liver Function Tests: No results for input(s): AST, ALT, ALKPHOS, BILITOT, PROT, ALBUMIN in the last 168 hours. CBG: Recent Labs  Lab 07/23/21 0743 07/23/21 1148 07/23/21 1618 07/23/21 1930 07/24/21 1232  GLUCAP 195* 284* 316* 265* 112*    Discharge time spent: greater than 30 minutes.  Signed: Murray Hodgkins, MD [facility] 07/24/2021

## 2021-07-24 NOTE — Plan of Care (Signed)
Discharge instructions reviewed with patient and spouse. Questions concerns denied at this time. Pt is stable, no change from am assessment.

## 2021-07-26 ENCOUNTER — Telehealth: Payer: Self-pay | Admitting: Medical

## 2021-07-26 ENCOUNTER — Other Ambulatory Visit: Payer: Self-pay | Admitting: Medical

## 2021-07-26 DIAGNOSIS — I2699 Other pulmonary embolism without acute cor pulmonale: Secondary | ICD-10-CM

## 2021-07-26 LAB — GLUCOSE, CAPILLARY: Glucose-Capillary: 308 mg/dL — ABNORMAL HIGH (ref 70–99)

## 2021-07-26 NOTE — Telephone Encounter (Signed)
Lvm to call back

## 2021-07-26 NOTE — Telephone Encounter (Signed)
Referral to hematologist placed.

## 2021-07-26 NOTE — Telephone Encounter (Signed)
Pt spouse contact ov and ws advised by Dr. Sarajane Jews to sched an appt with provider supervising md Dr. [name]. Pt had a blood clot in his lungs, xarelto breakthrough clot. Pt was switch to lovenox. Pt was advised to sched an appt to switch meds to warfarin. Please advise.

## 2021-07-27 ENCOUNTER — Encounter: Payer: Self-pay | Admitting: Medical

## 2021-07-27 ENCOUNTER — Other Ambulatory Visit (HOSPITAL_COMMUNITY): Payer: Self-pay

## 2021-07-27 ENCOUNTER — Other Ambulatory Visit: Payer: Self-pay | Admitting: Medical

## 2021-07-27 DIAGNOSIS — M4635 Infection of intervertebral disc (pyogenic), thoracolumbar region: Secondary | ICD-10-CM | POA: Diagnosis not present

## 2021-07-27 DIAGNOSIS — M4325 Fusion of spine, thoracolumbar region: Secondary | ICD-10-CM | POA: Diagnosis not present

## 2021-07-27 MED ORDER — ZOLPIDEM TARTRATE 5 MG PO TABS: 5.0000 mg | ORAL_TABLET | Freq: Every evening | ORAL | 0 refills | Status: DC | PRN

## 2021-07-27 NOTE — Telephone Encounter (Addendum)
Requesting: testosterone 200mg /mL Contract: n/a UDS: n/a Last Visit: 05/18/2021 Next Visit: None Last Refill: 05/24/2021 #2mL   Please Advise  Not indicated presently. Recent PE. Referring to hematologist. Testosterone could be factor in PE and DVT. So presently not filling until get specialist opinion.

## 2021-07-28 ENCOUNTER — Encounter: Payer: Self-pay | Admitting: Medical

## 2021-07-28 ENCOUNTER — Other Ambulatory Visit: Payer: Self-pay | Admitting: Medical

## 2021-07-28 NOTE — Telephone Encounter (Signed)
Scheduled pt to speak with Percell Miller

## 2021-08-02 ENCOUNTER — Ambulatory Visit (INDEPENDENT_AMBULATORY_CARE_PROVIDER_SITE_OTHER): Payer: Medicare Other | Admitting: Medical

## 2021-08-02 VITALS — BP 136/62 | HR 95 | Temp 97.8°F | Resp 20 | Ht 70.0 in | Wt 165.0 lb

## 2021-08-02 DIAGNOSIS — E119 Type 2 diabetes mellitus without complications: Secondary | ICD-10-CM | POA: Diagnosis not present

## 2021-08-02 DIAGNOSIS — G4700 Insomnia, unspecified: Secondary | ICD-10-CM | POA: Diagnosis not present

## 2021-08-02 DIAGNOSIS — R7989 Other specified abnormal findings of blood chemistry: Secondary | ICD-10-CM

## 2021-08-02 DIAGNOSIS — E291 Testicular hypofunction: Secondary | ICD-10-CM

## 2021-08-02 DIAGNOSIS — I2699 Other pulmonary embolism without acute cor pulmonale: Secondary | ICD-10-CM | POA: Diagnosis not present

## 2021-08-02 DIAGNOSIS — M4624 Osteomyelitis of vertebra, thoracic region: Secondary | ICD-10-CM

## 2021-08-02 NOTE — Progress Notes (Signed)
Subjective:    [name], male    DOB: [birth year], 76 y.o.   MRN: [id number]  HPI  Pt in for follow up from the hospital.    Pt had pneumonia and he also had a PE.  Pt was a xarelto failure. He has appointment with Dr. Katheran Awe on Wednesday. Currently on lovenox.  Is fatigued and may be related to not being on testosterone.   Ct chest on 07/15/2021.  IMPRESSION: 1. The appearance of the lungs is most compatible with severe multilobar bilateral pneumonia, as above. 2. Diffuse bronchial wall thickening with mild centrilobular and paraseptal emphysema; imaging findings suggestive of underlying COPD. 3. Aortic atherosclerosis, in addition to left main and 2 vessel coronary artery disease. Assessment for potential risk factor modification, dietary therapy or pharmacologic therapy may be warranted, if clinically indicated.  Pt seeing pulmon logist on August 12, 2020.  Dec 30th ct chest showed.   IMPRESSION: 1. Segmental and subsegmental arterial emboli in the right upper and lower lobes. The overall clot burden appears small to moderate. There may be additional small vessel emboli in the right middle lobe, difficult to confirm given respiratory motion.  Pt has insomiia. Refilled ambien.   Anxiety- on effexor. Not on benzodiazepene presently.  Atrial fibrillation-  pt having ablation procedure October 07, 2020.  Diabetes- continue current diabetes meds. Continue janvuia and metformin. Is on long tapered dose prednsone presenlty.   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  He was started on dalbavancin 1500 mg IV x1 dose then 1000 mg IV every 2 weeks via peripheral IVs from [facility].  This suppressive regimen was initiated on 05/28/2021.  His inflammatory markers from  that date were improved and normalized.  His back pain is stable and he is overall doing well.  We will continue suppressive dalbavancin for now.  Anticipate a prolonged course of therapy as long as he continues to tolerate.  Continue labs with every 2-week infusions and follow-up in 3 months.   Review of Systems  Constitutional:  Positive for fatigue. Negative for chills and fever.  Respiratory:  Negative for cough, chest tightness and wheezing.   Cardiovascular:  Negative for chest pain and palpitations.  Gastrointestinal:  Negative for abdominal pain, blood in stool and diarrhea.  Genitourinary:  Negative for dysuria, flank pain, frequency and hematuria.  Musculoskeletal:  Positive for back pain. Negative for myalgias and neck pain.  Skin:  Negative for rash.  Neurological:  Negative for dizziness, seizures, light-headedness, numbness and headaches.  Psychiatric/Behavioral:  Positive for sleep disturbance. Negative for behavioral problems, confusion, hallucinations and suicidal ideas. The patient is not nervous/anxious.     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   Esophageal reflux 02/10/2015   Fatigue    HTN (hypertension)  Hyperglycemia    preDM    Hyperlipidemia    Insomnia    Low back pain radiating to both legs 01/16/2017   Measles as a child   Overweight(278.02)    Peripheral neuropathy 08/07/2019   Personal history of colonic polyps 10/27/2012   Follows with [facility]   Pneumonia    " walking"   Preventative health care     Testosterone deficiency 05/23/2012   Wears glasses      Social History   Socioeconomic History   Marital status: Married    Spouse name: Not on file   Number of children: Not on file   Years of education: Not on file   Highest education level: Not on file  Occupational History   Not on file  Tobacco Use   Smoking status: Former    Packs/day: 1.00    Years: 20.00    Pack years: 20.00    Types: Cigarettes    Quit date: 07/18/1989    Years since quitting: 32.0   Smokeless tobacco: Never  Vaping Use   Vaping Use: Never used  Substance and Sexual Activity   Alcohol use: Not Currently   Drug use: No   Sexual activity: Yes    Comment: lives with wife, still working, no dietary restrictions, continues to exercise intermittently  Other Topics Concern   Not on file  Social History Narrative   Not on file   Social Determinants of Health   Financial Resource Strain: Low Risk    Difficulty of Paying Living Expenses: Not hard at all  Food Insecurity: No Food Insecurity   Worried About Charity fundraiser in the Last Year: Never true   Arboriculturist in the Last Year: Never true  Transportation Needs: No Transportation Needs   Lack of Transportation (Medical): No   Lack of Transportation (Non-Medical): No  Physical Activity: Sufficiently Active   Days of Exercise per Week: 7 days   Minutes of Exercise per Session: 30 min  Stress: No Stress Concern Present   Feeling of Stress : Not at all  Social Connections: Moderately Integrated   Frequency of Communication with Friends and Family: More than three times a week   Frequency of Social Gatherings with Friends and Family: More than three times a week   Attends Religious Services: More than 4 times per year   Active Member of Clubs or Organizations: No   Attends Archivist Meetings: Never   Marital Status: Married  Human resources officer Violence: Not At Risk   Fear of Current or Ex-Partner: No   Emotionally Abused: No    Physically Abused: No   Sexually Abused: No    Past Surgical History:  Procedure Laterality Date   BACK SURGERY  2012 and 1994   Dr Margret Chance, screws and cage in low back   BIOPSY  01/09/2020   Procedure: BIOPSY;  Surgeon: Otis Brace, MD;  Location: [facility];  Service: Gastroenterology;;   BRONCHIAL BIOPSY  11/17/2020   Procedure: BRONCHIAL BIOPSIES;  Surgeon: Laurin Coder, MD;  Location: [facility];  Service: Endoscopy;;   BRONCHIAL BRUSHINGS  11/17/2020   Procedure: BRONCHIAL BRUSHINGS;  Surgeon: Laurin Coder, MD;  Location: [facility];  Service: Endoscopy;;   BRONCHIAL WASHINGS  11/17/2020   Procedure: BRONCHIAL WASHINGS;  Surgeon: Laurin Coder, MD;  Location: [facility];  Service: Endoscopy;;   COLONOSCOPY WITH PROPOFOL N/A 01/09/2020   Procedure: COLONOSCOPY WITH PROPOFOL;  Surgeon: Otis Brace, MD;  Location:  [facility];  Service: Gastroenterology;  Laterality: N/A;   ESOPHAGOGASTRODUODENOSCOPY (EGD) WITH PROPOFOL N/A 01/09/2020   Procedure: ESOPHAGOGASTRODUODENOSCOPY (EGD) WITH PROPOFOL;  Surgeon: Otis Brace, MD;  Location: [facility];  Service: Gastroenterology;  Laterality: N/A;   EYE SURGERY Bilateral    2016   HARDWARE REVISION  03/12/2019   REVISION OF HARDWARE THORACIC TEN-THORACIC ELEVEN WITH APPLICATION OF ADDITIONAL RODS AND ROD SLEEVES THORACIC EIGHT-THORACIC TWELVE 12-LUMBAR ONE (N/A )   HEMOSTASIS CONTROL  11/17/2020   Procedure: HEMOSTASIS CONTROL;  Surgeon: Laurin Coder, MD;  Location: [facility];  Service: Endoscopy;;   IR US GUIDE BX ASP/DRAIN  11/13/2020   LAMINECTOMY  02/12/2019   DECOMPRESSIVE LAMINECTOMY THORACIC NINE-THORACIC TEN AND THORACIC TEN-THORACIC ELEVEN, EXTENSION OF THORACOLUMBAR FUSION FROM THORACIC TEN TO THORACIC FIVE, LOCAL BONE GRAFT, ALLOGRAFT AND VIVIGEN (N/A)   POLYPECTOMY  01/09/2020   Procedure: POLYPECTOMY;  Surgeon: Otis Brace, MD;  Location: [facility];  Service: Gastroenterology;;    REMOVAL OF RODS AND PEDICLE SCREWS T5 AND T6, EXPLORATION OF FUSION, BIOPSY TRANSPEDICULAR T5 AND T6 (N/A )  09/14/2020   SPINAL FUSION N/A 02/12/2019   Procedure: DECOMPRESSIVE LAMINECTOMY THORACIC NINE-THORACIC TEN AND THORACIC TEN-THORACIC ELEVEN, EXTENSION OF THORACOLUMBAR FUSION FROM THORACIC TEN TO THORACIC FIVE, LOCAL BONE GRAFT, ALLOGRAFT AND VIVIGEN;  Surgeon: Jessy Oto, MD;  Location: [place];  Service: Orthopedics;  Laterality: N/A;  DECOMPRESSIVE LAMINECTOMY THORACIC NINE-THORACIC TEN AND THORACIC TEN-THORACIC ELEVEN, EXTENSION OF TH   SPINAL FUSION N/A 03/12/2019   Procedure: REVISION OF HARDWARE THORACIC TEN-THORACIC ELEVEN WITH APPLICATION OF ADDITIONAL RODS AND ROD SLEEVES THORACIC EIGHT-THORACIC TWELVE 12-LUMBAR ONE;  Surgeon: Jessy Oto, MD;  Location: [place];  Service: Orthopedics;  Laterality: N/A;   TONSILLECTOMY     as child   torn rotator cuff  2010   right   VIDEO BRONCHOSCOPY N/A 11/17/2020   Procedure: VIDEO BRONCHOSCOPY WITH FLUORO;  Surgeon: Laurin Coder, MD;  Location: [facility];  Service: Endoscopy;  Laterality: N/A;    Family History  Problem Relation Age of Onset   Hypertension Mother    Diabetes Mother        type 2   Cancer Mother 52       breast in remission   Emphysema Father        smoker   COPD Father        smoker   Stroke Father 31       mini   Heart disease Father    Hypertension Sister    Hyperlipidemia Sister    Scoliosis Sister    Osteoporosis Sister    Hypertension Maternal Grandmother    Scoliosis Maternal Grandmother    Heart disease Maternal Grandfather    Heart disease Paternal Grandfather        smoker   Heart disease Daughter     Allergies  Allergen Reactions   Daptomycin     Drug induced pneumonia     Current Outpatient Medications on File Prior to Visit  Medication Sig Dispense Refill   albuterol (PROVENTIL HFA) 108 (90 Base) MCG/ACT inhaler TAKE 2 PUFFS BY MOUTH EVERY 6 HOURS AS NEEDED FOR WHEEZE OR  SHORTNESS OF BREATH (Patient taking differently: 2 puffs every 6 (six) hours as needed for wheezing. TAKE 2 PUFFS BY MOUTH EVERY 6 HOURS AS NEEDED FOR WHEEZE OR SHORTNESS OF BREATH) 8 g 5   atorvastatin (LIPITOR) 10 MG tablet TAKE 1 TABLET BY MOUTH EVERY DAY 30 tablet 0   DALVANCE 500  MG SOLR Inject 500 mg into the vein every 14 (fourteen) days. Pt states this is a every 2 week antibiotic. Could not state how long therapy should be.     digoxin (LANOXIN) 0.125 MG tablet Take 1 tablet (0.125 mg total) by mouth daily. 90 tablet 3   enoxaparin (LOVENOX) 80 MG/0.8ML injection Inject 0.8 mLs (80 mg total) into the skin every 12 (twelve) hours. 80 mL 1   enoxaparin (LOVENOX) KIT Dispense Lovenox education kit     ferrous fumarate (FERRETTS) 325 (106 Fe) MG TABS tablet Take 1 tablet (106 mg of iron total) by mouth daily. 90 tablet 3   glucose blood (CONTOUR NEXT TEST) test strip Check blood sugar once daily 100 each 12   metFORMIN (GLUCOPHAGE) 1000 MG tablet TAKE 1 TABLET (1,000 MG TOTAL) BY MOUTH 2 (TWO) TIMES DAILY WITH A MEAL. 180 tablet 0   oxyCODONE-acetaminophen (PERCOCET/ROXICET) 5-325 MG tablet Take 1 tablet by mouth every 4 (four) hours as needed.     pantoprazole (PROTONIX) 40 MG tablet TAKE 1 TABLET BY MOUTH TWICE A DAY BEFORE A MEAL (Patient taking differently: 40 mg 2 (two) times daily before a meal.) 180 tablet 1   predniSONE (DELTASONE) 10 MG tablet Take 4 tablets (40 mg total) by mouth daily with breakfast for 10 days, THEN 3 tablets (30 mg total) daily with breakfast for 10 days, THEN 2 tablets (20 mg total) daily with breakfast for 10 days, THEN 1 tablet (10 mg total) daily with breakfast for 10 days. 100 tablet 0   pregabalin (LYRICA) 75 MG capsule TAKE 1 CAPSULE BY MOUTH TWICE A DAY (Patient taking differently: Take 75 mg by mouth 2 (two) times daily.) 60 capsule 3   sitaGLIPtin (JANUVIA) 50 MG tablet Take 1 tablet (50 mg total) by mouth daily. 30 tablet 3   STIOLTO RESPIMAT 2.5-2.5  MCG/ACT AERS Inhale 2 puffs into the lungs daily.     tamsulosin (FLOMAX) 0.4 MG CAPS capsule Take 2 capsules (0.8 mg total) by mouth daily. 180 capsule 3   testosterone cypionate (DEPOTESTOSTERONE CYPIONATE) 200 MG/ML injection INJECT 1 ML (200 MG TOTAL) INTO THE MUSCLE EVERY 14 DAYS (Patient taking differently: Inject 200 mg into the muscle every 14 (fourteen) days.) 2 mL 0   tiZANidine (ZANAFLEX) 4 MG tablet TAKE 1.5 TABLETS (6 MG TOTAL) BY MOUTH EVERY 6 (SIX) HOURS AS NEEDED FOR MUSCLE SPASMS. 540 tablet 1   valACYclovir (VALTREX) 1000 MG tablet TAKE 1 TABLET (1,000 MG TOTAL) BY MOUTH 2 (TWO) TIMES DAILY FOR 10 DAYS. 20 tablet 0   venlafaxine XR (EFFEXOR-XR) 150 MG 24 hr capsule TAKE 1 CAPSULE BY MOUTH DAILY WITH BREAKFAST. (Patient taking differently: 150 mg daily with breakfast.) 90 capsule 0   zolpidem (AMBIEN) 5 MG tablet Take 1 tablet (5 mg total) by mouth at bedtime as needed. for sleep 14 tablet 0   Current Facility-Administered Medications on File Prior to Visit  Medication Dose Route Frequency Provider Last Rate Last Admin   testosterone cypionate (DEPOTESTOSTERONE CYPIONATE) injection 200 mg  200 mg Intramuscular Q14 Days Breckin Zafar, Percell Miller, PA-C   200 mg at 07/06/21 0953    BP 136/62    Pulse 95    Temp 97.8 F (36.6 C)    Resp 20    Ht _0  (1.778 m)    Wt 165 lb (74.8 kg)    SpO2 94%    BMI 23.68 kg/m       Objective:   Physical Exam  General  Mental Status- Alert. General Appearance- Not in acute distress.   Skin General: Color- Normal Color. Moisture- Normal Moisture.  Neck Carotid Arteries- Normal color. Moisture- Normal Moisture. No carotid bruits. No JVD.  Chest and Lung Exam Auscultation: Breath Sounds:-Normal.  Cardiovascular Auscultation:Rythm- Regular. Murmurs & Other Heart Sounds:Auscultation of the heart reveals- No Murmurs.  Abdomen Inspection:-Inspeection Normal. Palpation/Percussion:Note:No mass. Palpation and Percussion of the abdomen reveal- Non  Tender, Non Distended + BS, no rebound or guarding.  Neurologic Cranial Nerve exam:- CN III-XII intact(No nystagmus), symmetric smile. Strength:- 5/5 equal and symmetric strength both upper and lower extremities.       Assessment & Plan:   Patient Instructions  Pulmonary embolism treated during hospitalization for pneumonia.  On Lovenox daily and seeing hematology later this week to evaluate conversion to coumadin possibly.  Consider getting CBC today in light of patient's described fatigue but decided to wait as this might be a part of hematology work-up.  Has upcoming appointment at the end of this month with pulmonologist.  Defer imaging study ordered to specialist.  Fatigue could be multifactorial but suspect it is likely from discontinuation of testosterone injections.  Had a concern that testosterone might have played a role in breakthrough PE while on Xarelto.  Wanted to get opinion from hematology regarding this.  Wondering if Clomid might be option?  If so then refer to endocrinologist.  Diabetes-on metformin and Januvia.  Recommend strict diet while on long taper dose of prednisone.  Insomnia-we will refill Ambien when due.  Note on review since you are no longer on Xanax could prescribe refill volume of 30 tablets.  Subacute osteomyelitis of thoracic spine.  Continue management per infectious disease MD.  On oxycodone for pain management regarding chronic back pain.  Follow-up 1 month or sooner if needed   [employer], [employer]

## 2021-08-02 NOTE — Patient Instructions (Addendum)
Pulmonary embolism treated during hospitalization for pneumonia.  On Lovenox daily and seeing hematology later this week to evaluate conversion to coumadin possibly.  Consider getting CBC today in light of patient's described fatigue but decided to wait as this might be a part of hematology work-up.  Has upcoming appointment at the end of this month with pulmonologist.  Defer imaging study ordered to specialist.  Fatigue could be multifactorial but suspect it is likely from discontinuation of testosterone injections.  Had a concern that testosterone might have played a role in breakthrough PE while on Xarelto.  Wanted to get opinion from hematology regarding this.  Wondering if Clomid might be option?  If so then refer to endocrinologist.  Diabetes-on metformin and Januvia.  Recommend strict diet while on long taper dose of prednisone.  Insomnia-we will refill Ambien when due.  Note on review since you are no longer on Xanax could prescribe refill volume of 30 tablets.  Subacute osteomyelitis of thoracic spine.  Continue management per infectious disease MD.  On oxycodone for pain management regarding chronic back pain.  Follow-up 1 month or sooner if needed

## 2021-08-03 ENCOUNTER — Other Ambulatory Visit: Payer: Self-pay | Admitting: Family

## 2021-08-03 DIAGNOSIS — D6859 Other primary thrombophilia: Secondary | ICD-10-CM

## 2021-08-03 DIAGNOSIS — I2699 Other pulmonary embolism without acute cor pulmonale: Secondary | ICD-10-CM

## 2021-08-04 ENCOUNTER — Other Ambulatory Visit: Payer: Self-pay

## 2021-08-04 ENCOUNTER — Encounter: Payer: Self-pay | Admitting: Family

## 2021-08-04 ENCOUNTER — Telehealth: Payer: Self-pay

## 2021-08-04 ENCOUNTER — Inpatient Hospital Stay: Payer: Medicare Other | Admitting: Family

## 2021-08-04 ENCOUNTER — Inpatient Hospital Stay: Payer: Medicare Other | Attending: Hematology & Oncology

## 2021-08-04 VITALS — BP 130/63 | HR 82 | Temp 97.7°F | Resp 18 | Wt 165.0 lb

## 2021-08-04 DIAGNOSIS — I2699 Other pulmonary embolism without acute cor pulmonale: Secondary | ICD-10-CM | POA: Insufficient documentation

## 2021-08-04 DIAGNOSIS — I4891 Unspecified atrial fibrillation: Secondary | ICD-10-CM | POA: Insufficient documentation

## 2021-08-04 DIAGNOSIS — Z87891 Personal history of nicotine dependence: Secondary | ICD-10-CM | POA: Diagnosis not present

## 2021-08-04 DIAGNOSIS — Z86718 Personal history of other venous thrombosis and embolism: Secondary | ICD-10-CM | POA: Insufficient documentation

## 2021-08-04 DIAGNOSIS — Z79899 Other long term (current) drug therapy: Secondary | ICD-10-CM | POA: Diagnosis not present

## 2021-08-04 DIAGNOSIS — D6859 Other primary thrombophilia: Secondary | ICD-10-CM

## 2021-08-04 DIAGNOSIS — Z7901 Long term (current) use of anticoagulants: Secondary | ICD-10-CM | POA: Diagnosis not present

## 2021-08-04 LAB — CMP (CANCER CENTER ONLY)
ALT: 8 U/L (ref 0–44)
AST: 11 U/L — ABNORMAL LOW (ref 15–41)
Albumin: 4.3 g/dL (ref 3.5–5.0)
Alkaline Phosphatase: 64 U/L (ref 38–126)
Anion gap: 7 (ref 5–15)
BUN: 20 mg/dL (ref 8–23)
CO2: 34 mmol/L — ABNORMAL HIGH (ref 22–32)
Calcium: 10.1 mg/dL (ref 8.9–10.3)
Chloride: 88 mmol/L — ABNORMAL LOW (ref 98–111)
Creatinine: 0.91 mg/dL (ref 0.61–1.24)
GFR, Estimated: >60 mL/min (ref >60)
Glucose, Bld: 454 mg/dL — ABNORMAL HIGH (ref 70–99)
Potassium: 4.8 mmol/L (ref 3.5–5.1)
Sodium: 129 mmol/L — ABNORMAL LOW (ref 135–145)
Total Bilirubin: 0.6 mg/dL (ref 0.3–1.2)
Total Protein: 6.4 g/dL — ABNORMAL LOW (ref 6.5–8.1)

## 2021-08-04 LAB — D-DIMER, QUANTITATIVE: D-Dimer, Quant: 0.4 ug/mL-FEU (ref 0.00–0.50)

## 2021-08-04 LAB — LACTATE DEHYDROGENASE: LDH: 166 U/L (ref 98–192)

## 2021-08-04 LAB — CBC WITH DIFFERENTIAL (CANCER CENTER ONLY)
Abs Immature Granulocytes: 0.13 10*3/uL — ABNORMAL HIGH (ref 0.00–0.07)
Basophils Absolute: 0 10*3/uL (ref 0.0–0.1)
Basophils Relative: 0 %
Eosinophils Absolute: 0.1 10*3/uL (ref 0.0–0.5)
Eosinophils Relative: 1 %
HCT: 40.7 % (ref 39.0–52.0)
Hemoglobin: 13.5 g/dL (ref 13.0–17.0)
Immature Granulocytes: 2 %
Lymphocytes Relative: 7 %
Lymphs Abs: 0.5 10*3/uL — ABNORMAL LOW (ref 0.7–4.0)
MCH: 29.8 pg (ref 26.0–34.0)
MCHC: 33.2 g/dL (ref 30.0–36.0)
MCV: 89.8 fL (ref 80.0–100.0)
Monocytes Absolute: 0.4 10*3/uL (ref 0.1–1.0)
Monocytes Relative: 6 %
Neutro Abs: 6.1 10*3/uL (ref 1.7–7.7)
Neutrophils Relative %: 84 %
Platelet Count: 263 10*3/uL (ref 150–400)
RBC: 4.53 MIL/uL (ref 4.22–5.81)
RDW: 14.5 % (ref 11.5–15.5)
WBC Count: 7.3 10*3/uL (ref 4.0–10.5)
nRBC: 0 % (ref 0.0–0.2)

## 2021-08-04 NOTE — Telephone Encounter (Signed)
LMOM advising pt of results (ok per DPR). Requested a CB with any concerns or questions.

## 2021-08-04 NOTE — Telephone Encounter (Signed)
-----   Message from Celso Amy, NP sent at 08/04/2021 12:42 PM EST ----- Please let him know his blood glucose is 454! Vesta Mixer! If symptomatic may need to go to the ED. He was fine earlier.    ----- Message ----- From: Buel Ream, [facility] [place] Sent: 08/04/2021  11:11 AM EST To: Celso Amy, NP

## 2021-08-04 NOTE — Progress Notes (Signed)
Hematology/Oncology Consultation   Name: Luis Brown      MRN: [id number]    Location: Room/bed info not found  Date: 08/04/2021 Time:11:23 AM   REFERRING PHYSICIAN:  Mackie Pai, PA-C  REASON FOR CONSULT:  Pulmonary embolism   DIAGNOSIS: Pulmonary embolism with right heart strain  HISTORY OF PRESENT ILLNESS:  Luis Brown is a very pleasant 76 yo caucasian gentleman with recent diagnosis of breakthrough pulmonary emboli of the right upper and lower lobes with right heart strain while on Xarelto. He was then transitioned to Lovenox BID. He had pneumonia at the time as well.  No bleeding. No abnormal bruising, no petechiae.  Bilateral lower extremity US was negative for DVT.  He had started Xarelto earlier in 2022 for new onset of atrial fib as well as DVT involving the right axillary and brachial veins where he had had an IV placed.  He was on testosterone injection at the time of PE diagnosis and has since stopped. He is feeling quite fatigued as a result and is anxious to restart treatment. His PCP mentioned possibly starting him on Clomid pending our work-up results.  He notes mild SOB with over exertion and takes a break to rest when needed.  He is currently on 2 L supplemental O2 24 hours a day and sees pulmonologist Dr. Chase Caller.  No known familial history of thrombotic event.  He is scheduled for ablation with EP on 10/12/2021.  He has subacute osteomyelitis of the thoracic spine and is receiving IV antibiotics every other week with ID.  No personal history of cancer. His mother had breast cancer.  He is diabetic and currently takes Januvia and Metformin.  No history of thyroid disease. No fever, chills, n/v, cough, rash, dizziness, chest pain, palpitations, abdominal pain or changes in bowel or bladder habits.  No swelling or tenderness in his extremities.  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  No falls or syncope  to report.  He has maintained a good appetite and is staying well hydrated. His weight is stable at 165 lbs.  He quit smoking 35 years ago. No ETOH or recreational drug use.  He retired around 5 years ago from [employer].   ROS: All other 10 point review of systems is negative.   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   Esophageal reflux 02/10/2015   Fatigue    HTN (hypertension)    Hyperglycemia    preDM    Hyperlipidemia    Insomnia    Low back pain radiating to both legs 01/16/2017   Measles as a child   Overweight(278.02)    Peripheral neuropathy 08/07/2019   Personal history of colonic polyps 10/27/2012   Follows with [facility]   Pneumonia    " walking"   Preventative health care    Testosterone deficiency 05/23/2012   Wears glasses     ALLERGIES:  Allergies  Allergen Reactions   Daptomycin     Drug induced pneumonia       MEDICATIONS:  Current Outpatient Medications on File Prior to Visit  Medication Sig Dispense Refill   albuterol (PROVENTIL HFA) 108 (90 Base) MCG/ACT inhaler TAKE 2 PUFFS BY MOUTH EVERY 6 HOURS AS NEEDED FOR WHEEZE OR SHORTNESS OF BREATH (Patient taking differently: 2 puffs every 6 (six) hours as needed for wheezing. TAKE 2 PUFFS BY MOUTH EVERY 6 HOURS AS NEEDED FOR WHEEZE OR SHORTNESS OF BREATH) 8 g 5   atorvastatin (LIPITOR) 10 MG tablet TAKE 1 TABLET BY MOUTH EVERY DAY 30 tablet 0   DALVANCE 500 MG SOLR Inject 500 mg into the vein every 14 (fourteen) days. Pt  states this is a every 2 week antibiotic. Could not state how long therapy should be.     digoxin (LANOXIN) 0.125 MG tablet Take 1 tablet (0.125 mg total) by mouth daily. 90 tablet 3   enoxaparin (LOVENOX) 80 MG/0.8ML injection Inject 0.8 mLs (80 mg total) into the skin every 12 (twelve) hours. 80 mL 1   enoxaparin (LOVENOX) KIT Dispense Lovenox education kit     ferrous fumarate (FERRETTS) 325 (106 Fe) MG TABS tablet Take 1 tablet (106 mg of iron total) by mouth daily. 90 tablet 3   glucose blood (CONTOUR NEXT TEST) test strip Check blood sugar once daily 100 each 12   metFORMIN (GLUCOPHAGE) 1000 MG tablet TAKE 1 TABLET (1,000 MG TOTAL) BY MOUTH 2 (TWO) TIMES DAILY WITH A MEAL. 180 tablet 0   oxyCODONE-acetaminophen (PERCOCET/ROXICET) 5-325 MG tablet Take 1 tablet by mouth every 4 (four) hours as needed.     pantoprazole (PROTONIX) 40 MG tablet TAKE 1 TABLET BY MOUTH TWICE A DAY BEFORE A MEAL (Patient taking differently: 40 mg 2 (two) times daily before a meal.) 180 tablet 1   predniSONE (DELTASONE) 10 MG tablet Take 4 tablets (40 mg total) by mouth daily with breakfast for 10 days, THEN 3 tablets (30 mg total) daily with breakfast for 10 days, THEN 2 tablets (20 mg total) daily with breakfast for 10 days, THEN 1 tablet (10 mg total) daily with breakfast for 10 days. 100 tablet 0   pregabalin (LYRICA) 75 MG capsule TAKE 1 CAPSULE BY MOUTH TWICE A DAY (Patient taking differently: Take 75 mg by mouth 2 (two) times daily.) 60 capsule 3   sitaGLIPtin (JANUVIA) 50 MG tablet Take 1 tablet (50 mg total) by mouth daily. 30 tablet 3   STIOLTO RESPIMAT 2.5-2.5 MCG/ACT AERS Inhale 2 puffs into the lungs daily.     tamsulosin (FLOMAX) 0.4 MG CAPS capsule Take 2 capsules (0.8 mg total) by mouth daily. 180 capsule 3   testosterone cypionate (DEPOTESTOSTERONE CYPIONATE) 200 MG/ML injection INJECT 1 ML (200 MG TOTAL) INTO THE MUSCLE EVERY 14 DAYS (Patient taking differently: Inject 200 mg into the muscle every 14  (fourteen) days.) 2 mL 0   tiZANidine (ZANAFLEX) 4 MG tablet TAKE 1.5 TABLETS (6 MG TOTAL) BY MOUTH EVERY 6 (SIX) HOURS AS NEEDED FOR MUSCLE SPASMS. 540 tablet 1   valACYclovir (VALTREX) 1000 MG tablet TAKE 1 TABLET (1,000 MG TOTAL) BY MOUTH 2 (TWO) TIMES DAILY FOR 10 DAYS. 20 tablet 0   venlafaxine XR (EFFEXOR-XR) 150 MG 24 hr capsule TAKE 1 CAPSULE BY MOUTH DAILY WITH BREAKFAST. (Patient taking differently: 150 mg daily with breakfast.) 90 capsule 0   zolpidem (AMBIEN) 5 MG tablet Take 1 tablet (5 mg total) by mouth at bedtime  as needed. for sleep 14 tablet 0   Current Facility-Administered Medications on File Prior to Visit  Medication Dose Route Frequency Provider Last Rate Last Admin   testosterone cypionate (DEPOTESTOSTERONE CYPIONATE) injection 200 mg  200 mg Intramuscular Q14 Days Saguier, Percell Miller, PA-C   200 mg at 07/06/21 4650     PAST SURGICAL HISTORY Past Surgical History:  Procedure Laterality Date   BACK SURGERY  2012 and 1994   Dr Margret Chance, screws and cage in low back   BIOPSY  01/09/2020   Procedure: BIOPSY;  Surgeon: Otis Brace, MD;  Location: [facility];  Service: Gastroenterology;;   BRONCHIAL BIOPSY  11/17/2020   Procedure: BRONCHIAL BIOPSIES;  Surgeon: Laurin Coder, MD;  Location: [facility];  Service: Endoscopy;;   BRONCHIAL BRUSHINGS  11/17/2020   Procedure: BRONCHIAL BRUSHINGS;  Surgeon: Laurin Coder, MD;  Location: [facility];  Service: Endoscopy;;   BRONCHIAL WASHINGS  11/17/2020   Procedure: BRONCHIAL WASHINGS;  Surgeon: Laurin Coder, MD;  Location: [facility];  Service: Endoscopy;;   COLONOSCOPY WITH PROPOFOL N/A 01/09/2020   Procedure: COLONOSCOPY WITH PROPOFOL;  Surgeon: Otis Brace, MD;  Location: [facility];  Service: Gastroenterology;  Laterality: N/A;   ESOPHAGOGASTRODUODENOSCOPY (EGD) WITH PROPOFOL N/A 01/09/2020   Procedure: ESOPHAGOGASTRODUODENOSCOPY (EGD) WITH PROPOFOL;  Surgeon: Otis Brace, MD;  Location: [facility];  Service: Gastroenterology;  Laterality: N/A;   EYE SURGERY Bilateral    2016   HARDWARE REVISION  03/12/2019   REVISION OF HARDWARE THORACIC TEN-THORACIC ELEVEN WITH APPLICATION OF ADDITIONAL RODS AND ROD SLEEVES THORACIC EIGHT-THORACIC TWELVE 12-LUMBAR ONE (N/A )   HEMOSTASIS CONTROL  11/17/2020   Procedure: HEMOSTASIS CONTROL;  Surgeon: Laurin Coder, MD;  Location: [facility];  Service: Endoscopy;;   IR US GUIDE BX ASP/DRAIN  11/13/2020   LAMINECTOMY  02/12/2019   DECOMPRESSIVE LAMINECTOMY THORACIC NINE-THORACIC TEN AND THORACIC TEN-THORACIC ELEVEN, EXTENSION OF THORACOLUMBAR FUSION FROM THORACIC TEN TO THORACIC FIVE, LOCAL BONE GRAFT, ALLOGRAFT AND VIVIGEN (N/A)   POLYPECTOMY  01/09/2020   Procedure: POLYPECTOMY;  Surgeon: Otis Brace, MD;  Location: [facility];  Service: Gastroenterology;;   REMOVAL OF RODS AND PEDICLE SCREWS T5 AND T6, EXPLORATION OF FUSION, BIOPSY TRANSPEDICULAR T5 AND T6 (N/A )  09/14/2020   SPINAL FUSION N/A 02/12/2019   Procedure: DECOMPRESSIVE LAMINECTOMY THORACIC NINE-THORACIC TEN AND THORACIC TEN-THORACIC ELEVEN, EXTENSION OF THORACOLUMBAR FUSION FROM THORACIC TEN TO THORACIC FIVE, LOCAL BONE GRAFT, ALLOGRAFT AND VIVIGEN;  Surgeon: Jessy Oto, MD;  Location: [facility];  Service: Orthopedics;  Laterality: N/A;  DECOMPRESSIVE LAMINECTOMY THORACIC NINE-THORACIC TEN AND THORACIC TEN-THORACIC ELEVEN, EXTENSION OF TH   SPINAL FUSION N/A 03/12/2019   Procedure: REVISION OF HARDWARE THORACIC TEN-THORACIC ELEVEN WITH APPLICATION OF ADDITIONAL RODS AND ROD SLEEVES THORACIC EIGHT-THORACIC TWELVE 12-LUMBAR ONE;  Surgeon: Jessy Oto, MD;  Location: [facility];  Service: Orthopedics;  Laterality: N/A;   TONSILLECTOMY     as child   torn rotator cuff  2010   right   VIDEO BRONCHOSCOPY N/A 11/17/2020   Procedure: VIDEO BRONCHOSCOPY WITH FLUORO;  Surgeon: Laurin Coder, MD;  Location: [facility];  Service: Endoscopy;  Laterality: N/A;    FAMILY  HISTORY: Family History  Problem Relation Age of Onset   Hypertension Mother    Diabetes Mother        type 2   Cancer Mother 56       breast in remission   Emphysema Father        smoker   COPD  Father        smoker   Stroke Father 27       mini   Heart disease Father    Hypertension Sister    Hyperlipidemia Sister    Scoliosis Sister    Osteoporosis Sister    Hypertension Maternal Grandmother    Scoliosis Maternal Grandmother    Heart disease Maternal Grandfather    Heart disease Paternal Grandfather        smoker   Heart disease Daughter     SOCIAL HISTORY:  reports that he quit smoking about 32 years ago. His smoking use included cigarettes. He has a 20.00 pack-year smoking history. He has never used smokeless tobacco. He reports that he does not currently use alcohol. He reports that he does not use drugs.  PERFORMANCE STATUS: The patient's performance status is 1 - Symptomatic but completely ambulatory  PHYSICAL EXAM: Most Recent Vital Signs: Blood pressure 130/63, pulse 82, temperature 97.7 F (36.5 C), temperature source Oral, resp. rate 18, weight 165 lb (74.8 kg), SpO2 100 %. BP 130/63 (BP Location: Left Arm, Patient Position: Sitting)    Pulse 82    Temp 97.7 F (36.5 C) (Oral)    Resp 18    Wt 165 lb (74.8 kg)    SpO2 100%    BMI 23.68 kg/m   General Appearance:    Alert, cooperative, no distress, appears stated age  Head:    Normocephalic, without obvious abnormality, atraumatic  Eyes:    PERRL, conjunctiva/corneas clear, EOM's intact, fundi    benign, both eyes             Throat:   Lips, mucosa, and tongue normal; teeth and gums normal  Neck:   Supple, symmetrical, trachea midline, no adenopathy;       thyroid:  No enlargement/tenderness/nodules; no carotid   bruit or JVD  Back:     Symmetric, ROM normal, tenderness along the thoracic spine with osteomyelitis   Lungs:     Clear to auscultation bilaterally, respirations unlabored  Chest wall:    No  tenderness or deformity  Heart:    Regular rate and rhythm, S1 and S2 normal, no murmur, rub   or gallop  Abdomen:     Soft, non-tender, bowel sounds active all four quadrants,    no masses, no organomegaly        Extremities:   Extremities normal, atraumatic, no cyanosis or edema  Pulses:   2+ and symmetric all extremities  Skin:   Skin color, texture, turgor normal, no rashes or lesions  Lymph nodes:   Cervical, supraclavicular, and axillary nodes normal  Neurologic:   CNII-XII intact. Normal strength, sensation and reflexes      throughout    LABORATORY DATA:  Results for orders placed or performed in visit on 08/04/21 (from the past 48 hour(s))  CBC with Differential ([facility] Only)     Status: Abnormal   Collection Time: 08/04/21 10:46 AM  Result Value Ref Range   WBC Count 7.3 4.0 - 10.5 K/uL   RBC 4.53 4.22 - 5.81 MIL/uL   Hemoglobin 13.5 13.0 - 17.0 g/dL   HCT 40.7 39.0 - 52.0 %   MCV 89.8 80.0 - 100.0 fL   MCH 29.8 26.0 - 34.0 pg   MCHC 33.2 30.0 - 36.0 g/dL   RDW 14.5 11.5 - 15.5 %   Platelet Count 263 150 - 400 K/uL   nRBC 0.0 0.0 - 0.2 %   Neutrophils Relative %  84 %   Neutro Abs 6.1 1.7 - 7.7 K/uL   Lymphocytes Relative 7 %   Lymphs Abs 0.5 (L) 0.7 - 4.0 K/uL   Monocytes Relative 6 %   Monocytes Absolute 0.4 0.1 - 1.0 K/uL   Eosinophils Relative 1 %   Eosinophils Absolute 0.1 0.0 - 0.5 K/uL   Basophils Relative 0 %   Basophils Absolute 0.0 0.0 - 0.1 K/uL   Immature Granulocytes 2 %   Abs Immature Granulocytes 0.13 (H) 0.00 - 0.07 K/uL    Comment: Performed at [facility] at [facility], [address], [place], [place] [address]   *Note: Due to a large number of results and/or encounters for the requested time period, some results have not been displayed. A complete set of results can be found in Results Review.      RADIOGRAPHY: No results found.     PATHOLOGY: None  ASSESSMENT/PLAN: Mr. Vanloan is a very pleasant 76 yo  caucasian gentleman with recent diagnosis of breakthrough pulmonary emboli of the right upper and lower lobes with right heart strain while on Xarelto.  He has atrial fib and history of DVT involving the right axillary and brachial veins where he had had an IV placed during hospital admission. He is now on Lovenox BID and tolerating nicely. He will continue his same regimen.  Hyper coag panel is pending. Follow-up in 8 weeks with repeat CT angio to evaluate response.  He will continue to hold his testosterone for now.   All questions were answered. The patient knows to call the clinic with any problems, questions or concerns. We can certainly see the patient much sooner if necessary.   Lottie Dawson, NP

## 2021-08-05 ENCOUNTER — Other Ambulatory Visit: Payer: Self-pay | Admitting: Family

## 2021-08-05 DIAGNOSIS — D6859 Other primary thrombophilia: Secondary | ICD-10-CM

## 2021-08-05 DIAGNOSIS — I2699 Other pulmonary embolism without acute cor pulmonale: Secondary | ICD-10-CM

## 2021-08-06 ENCOUNTER — Other Ambulatory Visit: Payer: Self-pay

## 2021-08-06 ENCOUNTER — Inpatient Hospital Stay: Payer: Medicare Other

## 2021-08-06 DIAGNOSIS — Z79899 Other long term (current) drug therapy: Secondary | ICD-10-CM | POA: Diagnosis not present

## 2021-08-06 DIAGNOSIS — I4891 Unspecified atrial fibrillation: Secondary | ICD-10-CM | POA: Diagnosis not present

## 2021-08-06 DIAGNOSIS — I2699 Other pulmonary embolism without acute cor pulmonale: Secondary | ICD-10-CM | POA: Diagnosis not present

## 2021-08-06 DIAGNOSIS — D6859 Other primary thrombophilia: Secondary | ICD-10-CM

## 2021-08-06 DIAGNOSIS — Z87891 Personal history of nicotine dependence: Secondary | ICD-10-CM | POA: Diagnosis not present

## 2021-08-06 DIAGNOSIS — Z7901 Long term (current) use of anticoagulants: Secondary | ICD-10-CM | POA: Diagnosis not present

## 2021-08-06 DIAGNOSIS — Z86718 Personal history of other venous thrombosis and embolism: Secondary | ICD-10-CM | POA: Diagnosis not present

## 2021-08-06 LAB — ANTITHROMBIN III: AntiThromb III Func: 114 % (ref 75–120)

## 2021-08-07 ENCOUNTER — Emergency Department (HOSPITAL_COMMUNITY): Payer: Medicare Other

## 2021-08-07 ENCOUNTER — Observation Stay (HOSPITAL_COMMUNITY)
Admission: EM | Admit: 2021-08-07 | Discharge: 2021-08-08 | Disposition: A | Discharge status: Home / Self Care | Payer: Medicare Other | Attending: Family Medicine | Admitting: Family Medicine

## 2021-08-07 ENCOUNTER — Other Ambulatory Visit: Payer: Self-pay

## 2021-08-07 ENCOUNTER — Encounter (HOSPITAL_COMMUNITY): Payer: Self-pay

## 2021-08-07 DIAGNOSIS — E1165 Type 2 diabetes mellitus with hyperglycemia: Secondary | ICD-10-CM | POA: Diagnosis not present

## 2021-08-07 DIAGNOSIS — Z743 Need for continuous supervision: Secondary | ICD-10-CM | POA: Diagnosis not present

## 2021-08-07 DIAGNOSIS — J189 Pneumonia, unspecified organism: Secondary | ICD-10-CM

## 2021-08-07 DIAGNOSIS — Z7984 Long term (current) use of oral hypoglycemic drugs: Secondary | ICD-10-CM | POA: Insufficient documentation

## 2021-08-07 DIAGNOSIS — E119 Type 2 diabetes mellitus without complications: Secondary | ICD-10-CM | POA: Insufficient documentation

## 2021-08-07 DIAGNOSIS — J690 Pneumonitis due to inhalation of food and vomit: Secondary | ICD-10-CM | POA: Diagnosis not present

## 2021-08-07 DIAGNOSIS — J439 Emphysema, unspecified: Secondary | ICD-10-CM

## 2021-08-07 DIAGNOSIS — T8484XA Pain due to internal orthopedic prosthetic devices, implants and grafts, initial encounter: Secondary | ICD-10-CM | POA: Diagnosis present

## 2021-08-07 DIAGNOSIS — Z87891 Personal history of nicotine dependence: Secondary | ICD-10-CM | POA: Insufficient documentation

## 2021-08-07 DIAGNOSIS — R0689 Other abnormalities of breathing: Secondary | ICD-10-CM | POA: Diagnosis not present

## 2021-08-07 DIAGNOSIS — E872 Acidosis, unspecified: Secondary | ICD-10-CM

## 2021-08-07 DIAGNOSIS — E871 Hypo-osmolality and hyponatremia: Secondary | ICD-10-CM | POA: Diagnosis not present

## 2021-08-07 DIAGNOSIS — Z20822 Contact with and (suspected) exposure to covid-19: Secondary | ICD-10-CM | POA: Insufficient documentation

## 2021-08-07 DIAGNOSIS — A419 Sepsis, unspecified organism: Secondary | ICD-10-CM

## 2021-08-07 DIAGNOSIS — I10 Essential (primary) hypertension: Secondary | ICD-10-CM | POA: Insufficient documentation

## 2021-08-07 DIAGNOSIS — R000 Tachycardia, unspecified: Secondary | ICD-10-CM | POA: Diagnosis not present

## 2021-08-07 DIAGNOSIS — R6520 Severe sepsis without septic shock: Secondary | ICD-10-CM | POA: Diagnosis present

## 2021-08-07 DIAGNOSIS — I499 Cardiac arrhythmia, unspecified: Secondary | ICD-10-CM | POA: Diagnosis not present

## 2021-08-07 DIAGNOSIS — J9611 Chronic respiratory failure with hypoxia: Secondary | ICD-10-CM | POA: Diagnosis not present

## 2021-08-07 DIAGNOSIS — E349 Endocrine disorder, unspecified: Secondary | ICD-10-CM | POA: Diagnosis present

## 2021-08-07 DIAGNOSIS — E291 Testicular hypofunction: Secondary | ICD-10-CM | POA: Diagnosis present

## 2021-08-07 DIAGNOSIS — I480 Paroxysmal atrial fibrillation: Secondary | ICD-10-CM | POA: Diagnosis present

## 2021-08-07 DIAGNOSIS — J449 Chronic obstructive pulmonary disease, unspecified: Secondary | ICD-10-CM | POA: Diagnosis present

## 2021-08-07 DIAGNOSIS — Z79899 Other long term (current) drug therapy: Secondary | ICD-10-CM | POA: Insufficient documentation

## 2021-08-07 DIAGNOSIS — J441 Chronic obstructive pulmonary disease with (acute) exacerbation: Secondary | ICD-10-CM | POA: Diagnosis present

## 2021-08-07 DIAGNOSIS — R0602 Shortness of breath: Secondary | ICD-10-CM | POA: Diagnosis not present

## 2021-08-07 DIAGNOSIS — R1310 Dysphagia, unspecified: Secondary | ICD-10-CM | POA: Diagnosis present

## 2021-08-07 DIAGNOSIS — I2699 Other pulmonary embolism without acute cor pulmonale: Secondary | ICD-10-CM | POA: Diagnosis present

## 2021-08-07 DIAGNOSIS — R059 Cough, unspecified: Secondary | ICD-10-CM | POA: Diagnosis not present

## 2021-08-07 LAB — RESP PANEL BY RT-PCR (FLU A&B, COVID) ARPGX2
Influenza A by PCR: NEGATIVE
Influenza B by PCR: NEGATIVE
SARS Coronavirus 2 by RT PCR: NEGATIVE

## 2021-08-07 LAB — URINALYSIS, ROUTINE W REFLEX MICROSCOPIC
Bacteria, UA: NONE SEEN
Bilirubin Urine: NEGATIVE
Glucose, UA: >=500 mg/dL — AB
Hgb urine dipstick: NEGATIVE
Ketones, ur: 5 mg/dL — AB
Leukocytes,Ua: NEGATIVE
Nitrite: NEGATIVE
Protein, ur: NEGATIVE mg/dL
Specific Gravity, Urine: 1.018 (ref 1.005–1.030)
pH: 6 (ref 5.0–8.0)

## 2021-08-07 LAB — CBC WITH DIFFERENTIAL/PLATELET
Abs Immature Granulocytes: 0.09 10*3/uL — ABNORMAL HIGH (ref 0.00–0.07)
Basophils Absolute: 0 10*3/uL (ref 0.0–0.1)
Basophils Relative: 0 %
Eosinophils Absolute: 0 10*3/uL (ref 0.0–0.5)
Eosinophils Relative: 0 %
HCT: 41.6 % (ref 39.0–52.0)
Hemoglobin: 14.1 g/dL (ref 13.0–17.0)
Immature Granulocytes: 1 %
Lymphocytes Relative: 4 %
Lymphs Abs: 0.4 10*3/uL — ABNORMAL LOW (ref 0.7–4.0)
MCH: 30.3 pg (ref 26.0–34.0)
MCHC: 33.9 g/dL (ref 30.0–36.0)
MCV: 89.3 fL (ref 80.0–100.0)
Monocytes Absolute: 0.4 10*3/uL (ref 0.1–1.0)
Monocytes Relative: 3 %
Neutro Abs: 10.6 10*3/uL — ABNORMAL HIGH (ref 1.7–7.7)
Neutrophils Relative %: 92 %
Platelets: 286 10*3/uL (ref 150–400)
RBC: 4.66 MIL/uL (ref 4.22–5.81)
RDW: 14.6 % (ref 11.5–15.5)
WBC: 11.6 10*3/uL — ABNORMAL HIGH (ref 4.0–10.5)
nRBC: 0 % (ref 0.0–0.2)

## 2021-08-07 LAB — COMPREHENSIVE METABOLIC PANEL
ALT: 16 U/L (ref 0–44)
AST: 24 U/L (ref 15–41)
Albumin: 4 g/dL (ref 3.5–5.0)
Alkaline Phosphatase: 54 U/L (ref 38–126)
Anion gap: 11 (ref 5–15)
BUN: 23 mg/dL (ref 8–23)
CO2: 24 mmol/L (ref 22–32)
Calcium: 8.8 mg/dL — ABNORMAL LOW (ref 8.9–10.3)
Chloride: 90 mmol/L — ABNORMAL LOW (ref 98–111)
Creatinine, Ser: 0.75 mg/dL (ref 0.61–1.24)
GFR, Estimated: >60 mL/min (ref >60)
Glucose, Bld: 381 mg/dL — ABNORMAL HIGH (ref 70–99)
Potassium: 4.5 mmol/L (ref 3.5–5.1)
Sodium: 125 mmol/L — ABNORMAL LOW (ref 135–145)
Total Bilirubin: 1.1 mg/dL (ref 0.3–1.2)
Total Protein: 6.4 g/dL — ABNORMAL LOW (ref 6.5–8.1)

## 2021-08-07 LAB — TROPONIN I (HIGH SENSITIVITY)
Troponin I (High Sensitivity): 15 ng/L (ref <18)
Troponin I (High Sensitivity): 28 ng/L — ABNORMAL HIGH (ref <18)

## 2021-08-07 LAB — BETA-2-GLYCOPROTEIN I ABS, IGG/M/A
Beta-2 Glyco I IgG: <9 GPI IgG units (ref 0–20)
Beta-2-Glycoprotein I IgA: <9 GPI IgA units (ref 0–25)
Beta-2-Glycoprotein I IgM: <9 GPI IgM units (ref 0–32)

## 2021-08-07 LAB — PROTIME-INR
INR: 1 (ref 0.8–1.2)
Prothrombin Time: 12.8 seconds (ref 11.4–15.2)

## 2021-08-07 LAB — BLOOD GAS, VENOUS
Acid-Base Excess: 0.8 mmol/L (ref 0.0–2.0)
Bicarbonate: 25.9 mmol/L (ref 20.0–28.0)
O2 Saturation: 72 %
Patient temperature: 98.6
pCO2, Ven: 45.7 mmHg (ref 44.0–60.0)
pH, Ven: 7.372 (ref 7.250–7.430)
pO2, Ven: 42.9 mmHg (ref 32.0–45.0)

## 2021-08-07 LAB — LACTIC ACID, PLASMA
Lactic Acid, Venous: 4.1 mmol/L (ref 0.5–1.9)
Lactic Acid, Venous: 5.2 mmol/L (ref 0.5–1.9)

## 2021-08-07 LAB — NA AND K (SODIUM & POTASSIUM), RAND UR
Potassium Urine: 37 mmol/L
Sodium, Ur: 52 mmol/L

## 2021-08-07 LAB — APTT: aPTT: 32 seconds (ref 24–36)

## 2021-08-07 LAB — GLUCOSE, CAPILLARY: Glucose-Capillary: 280 mg/dL — ABNORMAL HIGH (ref 70–99)

## 2021-08-07 MED ORDER — ZOLPIDEM TARTRATE 5 MG PO TABS
5.0000 mg | ORAL_TABLET | Freq: Every evening | ORAL | Status: DC | PRN
  Administered 2021-08-07: 5 mg via ORAL
  Filled 2021-08-07: qty 1

## 2021-08-07 MED ORDER — LACTATED RINGERS IV SOLN: INTRAVENOUS | Status: DC

## 2021-08-07 MED ORDER — IOHEXOL 350 MG/ML SOLN
75.0000 mL | Freq: Once | INTRAVENOUS | Status: AC | PRN
  Administered 2021-08-07: 75 mL via INTRAVENOUS

## 2021-08-07 MED ORDER — ONDANSETRON HCL 4 MG PO TABS: 4.0000 mg | ORAL_TABLET | Freq: Four times a day (QID) | ORAL | Status: DC | PRN

## 2021-08-07 MED ORDER — ENOXAPARIN SODIUM 80 MG/0.8ML IJ SOSY
80.0000 mg | PREFILLED_SYRINGE | Freq: Two times a day (BID) | INTRAMUSCULAR | Status: DC
  Administered 2021-08-07 – 2021-08-08 (×2): 80 mg via SUBCUTANEOUS
  Filled 2021-08-07 (×2): qty 0.8

## 2021-08-07 MED ORDER — SODIUM CHLORIDE 0.9 % IV BOLUS (SEPSIS)
1000.0000 mL | Freq: Once | INTRAVENOUS | Status: AC
  Administered 2021-08-07: 1000 mL via INTRAVENOUS

## 2021-08-07 MED ORDER — INSULIN ASPART 100 UNIT/ML IJ SOLN
0.0000 [IU] | Freq: Three times a day (TID) | INTRAMUSCULAR | Status: DC
  Administered 2021-08-08: 3 [IU] via SUBCUTANEOUS
  Administered 2021-08-08: 8 [IU] via SUBCUTANEOUS

## 2021-08-07 MED ORDER — ACETAMINOPHEN 650 MG RE SUPP: 650.0000 mg | Freq: Four times a day (QID) | RECTAL | Status: DC | PRN

## 2021-08-07 MED ORDER — TIZANIDINE HCL 4 MG PO TABS
6.0000 mg | ORAL_TABLET | Freq: Four times a day (QID) | ORAL | Status: DC | PRN
  Administered 2021-08-07 – 2021-08-08 (×2): 6 mg via ORAL
  Filled 2021-08-07 (×2): qty 2

## 2021-08-07 MED ORDER — SODIUM CHLORIDE 0.9 % IV SOLN
500.0000 mg | INTRAVENOUS | Status: DC
  Administered 2021-08-07: 500 mg via INTRAVENOUS
  Filled 2021-08-07: qty 5

## 2021-08-07 MED ORDER — ALBUTEROL SULFATE (2.5 MG/3ML) 0.083% IN NEBU: 2.5000 mg | INHALATION_SOLUTION | RESPIRATORY_TRACT | Status: DC | PRN

## 2021-08-07 MED ORDER — OXYCODONE-ACETAMINOPHEN 5-325 MG PO TABS
1.0000 | ORAL_TABLET | ORAL | Status: DC | PRN
Start: 2021-08-07 — End: 2021-08-08
  Administered 2021-08-07 – 2021-08-08 (×3): 1 via ORAL
  Filled 2021-08-07 (×3): qty 1

## 2021-08-07 MED ORDER — PREDNISONE 20 MG PO TABS
30.0000 mg | ORAL_TABLET | Freq: Every day | ORAL | Status: DC
  Administered 2021-08-08: 30 mg via ORAL
  Filled 2021-08-07: qty 2

## 2021-08-07 MED ORDER — ONDANSETRON HCL 4 MG/2ML IJ SOLN: 4.0000 mg | Freq: Four times a day (QID) | INTRAMUSCULAR | Status: DC | PRN

## 2021-08-07 MED ORDER — PREGABALIN 75 MG PO CAPS
75.0000 mg | ORAL_CAPSULE | Freq: Two times a day (BID) | ORAL | Status: DC
  Administered 2021-08-07 – 2021-08-08 (×2): 75 mg via ORAL
  Filled 2021-08-07 (×2): qty 1

## 2021-08-07 MED ORDER — TAMSULOSIN HCL 0.4 MG PO CAPS
0.8000 mg | ORAL_CAPSULE | Freq: Every day | ORAL | Status: DC
  Administered 2021-08-07 – 2021-08-08 (×2): 0.8 mg via ORAL
  Filled 2021-08-07 (×2): qty 2

## 2021-08-07 MED ORDER — ACETAMINOPHEN 325 MG PO TABS: 650.0000 mg | ORAL_TABLET | Freq: Four times a day (QID) | ORAL | Status: DC | PRN

## 2021-08-07 MED ORDER — SODIUM CHLORIDE 0.9 % IV SOLN
2.0000 g | INTRAVENOUS | Status: DC
  Administered 2021-08-07: 2 g via INTRAVENOUS
  Filled 2021-08-07: qty 20

## 2021-08-07 NOTE — Sepsis Progress Note (Signed)
Sepsis protocol is being followed by [facility]. 

## 2021-08-07 NOTE — Assessment & Plan Note (Addendum)
Initial concern for possible sepsis etiology. Patient is on albuterol and metformin as an outpatient as well. Given a 1L NS bolus in the ED and another bolus while admitted with resolution of lactic acidosis.

## 2021-08-07 NOTE — Assessment & Plan Note (Signed)
Noted  

## 2021-08-07 NOTE — Plan of Care (Signed)
  Problem: Coping: Goal: Level of anxiety will decrease Outcome: Progressing   Problem: Pain Managment: Goal: General experience of comfort will improve Outcome: Progressing   Problem: Safety: Goal: Ability to remain free from injury will improve Outcome: Progressing   

## 2021-08-07 NOTE — Assessment & Plan Note (Addendum)
Previously diagnosed.  Repeat CTA this admission shows resolution of pulmonary embolism.  Patient is been stable on Lovenox. Continue on discharge.

## 2021-08-07 NOTE — ED Provider Notes (Addendum)
[facility] DEPT Provider Note  CSN: [id number] Arrival date & time: 08/07/21 1454  Chief Complaint(s) Shortness of Breath (Choked on pill at home, short of breath)  HPI Luis Brown is a 76 y.o. male with PMH COPD, recent admission on 07/15/2021 for acute hypoxic respiratory failure and sepsis secondary to pneumonia with a new acute pulmonary embolism.  Patient ultimately discharged on Lovenox due to treatment failure with Xarelto.  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  He presents today with complaints of worsening shortness of breath with exertion exacerbated by an event today where he felt that he choked on a vitamin.  He states that he coughed up a large orange slurry and now does not feel that something is retained but is having significant worsening shortness of breath.  He arrives tachycardic with an increased oxygen requirement satting 91% on 4 L.  Denies chest pain, abdominal pain, nausea, vomiting or other systemic symptoms.   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   Esophageal reflux 02/10/2015   Fatigue    HTN (hypertension)    Hyperglycemia    preDM    Hyperlipidemia    Insomnia    Low back pain radiating to both legs 01/16/2017   Measles as a child   Overweight(278.02)    Peripheral neuropathy 08/07/2019   Personal history of colonic polyps 10/27/2012   Follows with  [facility]   Pneumonia    " walking"   Preventative health care    Testosterone deficiency 05/23/2012   Wears glasses    Patient Active Problem List   Diagnosis Date Noted   Pulmonary emboli ([place]) 07/16/2021   Bronchiectasis with acute exacerbation ([place]) 07/16/2021   Severe sepsis ([place]) 07/16/2021   CAP (community acquired pneumonia) 07/15/2021   History of DVT (deep vein thrombosis) 07/15/2021   Paroxysmal A-fib ([place]) 07/15/2021   COPD with acute exacerbation ([place]) 02/10/2021   Palpitations 02/01/2021   Wears glasses 01/11/2021   Pneumothorax, right 12/15/2020   Anxiety 12/01/2020   SOB (shortness of breath)    DVT (deep venous thrombosis) ([place]) 11/16/2020   Acute respiratory failure with hypoxia ([place]) 11/14/2020   Fluid collection at surgical site    Wound infection    Sepsis ([place]) 11/11/2020   Drug-induced pneumonitis 11/10/2020   S/P bronchoscopy 09/14/2020   Hyperglycemia 03/22/2020   Gastric ulcer 01/27/2020   H/O colonoscopy with polypectomy 01/27/2020   Protein-calorie malnutrition, severe 01/09/2020   Weight loss 01/07/2020   Abdominal pain 12/30/2019   Nausea and vomiting 12/30/2019   Weight loss, abnormal 12/30/2019   Change in bowel habits 12/30/2019   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   Medication monitoring encounter 08/26/2019   Peripheral neuropathy 08/07/2019   Hyponatremia 05/05/2019   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   Fusion of spine of thoracolumbar region 03/12/2019   Status  post thoracic spinal fusion 02/12/2019   Thoracic spinal stenosis 04/17/2017   Painful orthopaedic hardware ([place]) 04/17/2017   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 ([place]) 04/17/2017   Low back pain radiating to both legs 01/16/2017   Esophageal reflux 02/10/2015   Essential hypertension 06/25/2014   Asthma 06/25/2014   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   Overweight  06/25/2014   Other abnormal glucose 06/25/2014   Spinal stenosis of lumbar region without neurogenic claudication 04/09/2014   Flatback syndrome 04/09/2014   Chicken pox 10/21/2013   History of colonic polyps 10/27/2012   Testosterone deficiency 05/23/2012   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 Authorizing Provider  albuterol (PROVENTIL HFA) 108 (90 Base) MCG/ACT inhaler TAKE 2 PUFFS BY MOUTH EVERY 6 HOURS AS NEEDED FOR WHEEZE OR SHORTNESS OF BREATH Patient taking differently: 2 puffs every 6 (six) hours as needed for wheezing. TAKE 2 PUFFS BY MOUTH EVERY 6 HOURS AS NEEDED FOR WHEEZE OR SHORTNESS OF BREATH 05/03/21   Brand Males, MD  atorvastatin (LIPITOR) 10 MG tablet TAKE 1 TABLET BY MOUTH EVERY DAY 07/23/21   Saguier, Percell Miller, PA-C  DALVANCE 500 MG SOLR Inject 500 mg into the vein every 14 (fourteen) days. Pt states this is a every 2 week antibiotic. Could not state how long therapy should be. 06/04/21   [provider]  digoxin (LANOXIN) 0.125 MG tablet Take 1 tablet (0.125 mg total) by mouth daily. 01/22/21   Tobb, Kardie, DO  enoxaparin (LOVENOX) 80 MG/0.8ML injection Inject 0.8 mLs (80 mg total) into the skin every 12 (twelve) hours. 07/24/21   Samuella Cota, MD  enoxaparin (LOVENOX) KIT Dispense Lovenox education kit 07/24/21   Samuella Cota, MD  ferrous fumarate (FERRETTS) 325 (106 Fe) MG TABS tablet Take 1 tablet (106 mg of iron total) by mouth daily. 10/28/20   Saguier, Percell Miller, PA-C  glucose blood (CONTOUR NEXT TEST) test strip Check blood sugar once  daily 02/25/19   Saguier, Percell Miller, PA-C  metFORMIN (GLUCOPHAGE) 1000 MG tablet TAKE 1 TABLET (1,000 MG TOTAL) BY MOUTH 2 (TWO) TIMES DAILY WITH A MEAL. 06/18/21   Saguier, Percell Miller, PA-C  oxyCODONE-acetaminophen (PERCOCET/ROXICET) 5-325 MG tablet Take 1 tablet by mouth every 4 (four) hours as needed. 06/14/21   [provider]  pantoprazole (PROTONIX) 40 MG tablet TAKE 1 TABLET BY MOUTH TWICE A DAY BEFORE A MEAL Patient taking differently: 40 mg 2 (two) times daily before a meal. 12/21/20   Saguier, Percell Miller, PA-C  predniSONE (DELTASONE) 10 MG tablet Take 4 tablets (40 mg total) by mouth daily with breakfast for 10 days, THEN 3 tablets (30 mg total) daily with breakfast for 10 days, THEN 2 tablets (20 mg total) daily with breakfast for 10 days, THEN 1 tablet (10 mg total) daily with breakfast for 10 days. 07/24/21 09/02/21  Samuella Cota, MD  pregabalin (LYRICA) 75 MG capsule TAKE 1 CAPSULE BY MOUTH TWICE A DAY Patient taking differently: Take 75 mg by mouth 2 (two) times daily. 03/09/20   Jessy Oto,  MD  sitaGLIPtin (JANUVIA) 50 MG tablet Take 1 tablet (50 mg total) by mouth daily. 06/18/21   Saguier, Percell Miller, PA-C  STIOLTO RESPIMAT 2.5-2.5 MCG/ACT AERS Inhale 2 puffs into the lungs daily. 06/18/21   [provider]  tamsulosin (FLOMAX) 0.4 MG CAPS capsule Take 2 capsules (0.8 mg total) by mouth daily. 05/24/21   Saguier, Percell Miller, PA-C  testosterone cypionate (DEPOTESTOSTERONE CYPIONATE) 200 MG/ML injection INJECT 1 ML (200 MG TOTAL) INTO THE MUSCLE EVERY 14 DAYS Patient taking differently: Inject 200 mg into the muscle every 14 (fourteen) days. 05/24/21   Saguier, Percell Miller, PA-C  tiZANidine (ZANAFLEX) 4 MG tablet TAKE 1.5 TABLETS (6 MG TOTAL) BY MOUTH EVERY 6 (SIX) HOURS AS NEEDED FOR MUSCLE SPASMS. 07/13/21   Jessy Oto, MD  venlafaxine XR (EFFEXOR-XR) 150 MG 24 hr capsule TAKE 1 CAPSULE BY MOUTH DAILY WITH BREAKFAST. Patient taking differently: 150 mg daily with breakfast. 07/15/21    Saguier, Percell Miller, PA-C  zolpidem (AMBIEN) 5 MG tablet Take 1 tablet (5 mg total) by mouth at bedtime as needed. for sleep 07/27/21   Mackie Pai, PA-C                                                                                                                                    Past Surgical History Past Surgical History:  Procedure Laterality Date   BACK SURGERY  2012 and 1994   Dr Margret Chance, screws and cage in low back   BIOPSY  01/09/2020   Procedure: BIOPSY;  Surgeon: Otis Brace, MD;  Location: [facility];  Service: Gastroenterology;;   BRONCHIAL BIOPSY  11/17/2020   Procedure: BRONCHIAL BIOPSIES;  Surgeon: Laurin Coder, MD;  Location: [facility];  Service: Endoscopy;;   BRONCHIAL BRUSHINGS  11/17/2020   Procedure: BRONCHIAL BRUSHINGS;  Surgeon: Laurin Coder, MD;  Location: [facility];  Service: Endoscopy;;   BRONCHIAL WASHINGS  11/17/2020   Procedure: BRONCHIAL WASHINGS;  Surgeon: Laurin Coder, MD;  Location: [facility];  Service: Endoscopy;;   COLONOSCOPY WITH PROPOFOL N/A 01/09/2020   Procedure: COLONOSCOPY WITH PROPOFOL;  Surgeon: Otis Brace, MD;  Location: [facility];  Service: Gastroenterology;  Laterality: N/A;   ESOPHAGOGASTRODUODENOSCOPY (EGD) WITH PROPOFOL N/A 01/09/2020   Procedure: ESOPHAGOGASTRODUODENOSCOPY (EGD) WITH PROPOFOL;  Surgeon: Otis Brace, MD;  Location: [facility];  Service: Gastroenterology;  Laterality: N/A;   EYE SURGERY Bilateral    2016   HARDWARE REVISION  03/12/2019   REVISION OF HARDWARE THORACIC TEN-THORACIC ELEVEN WITH APPLICATION OF ADDITIONAL RODS AND ROD SLEEVES THORACIC EIGHT-THORACIC TWELVE 12-LUMBAR ONE (N/A )   HEMOSTASIS CONTROL  11/17/2020   Procedure: HEMOSTASIS CONTROL;  Surgeon: Laurin Coder, MD;  Location: [facility];  Service: Endoscopy;;   IR US GUIDE BX ASP/DRAIN  11/13/2020   LAMINECTOMY  02/12/2019   DECOMPRESSIVE LAMINECTOMY THORACIC NINE-THORACIC TEN AND THORACIC TEN-THORACIC ELEVEN,  EXTENSION OF THORACOLUMBAR FUSION FROM THORACIC TEN TO THORACIC FIVE, LOCAL BONE  GRAFT, ALLOGRAFT AND VIVIGEN (N/A)   POLYPECTOMY  01/09/2020   Procedure: POLYPECTOMY;  Surgeon: Otis Brace, MD;  Location: [facility];  Service: Gastroenterology;;   REMOVAL OF RODS AND PEDICLE SCREWS T5 AND T6, EXPLORATION OF FUSION, BIOPSY TRANSPEDICULAR T5 AND T6 (N/A )  09/14/2020   SPINAL FUSION N/A 02/12/2019   Procedure: DECOMPRESSIVE LAMINECTOMY THORACIC NINE-THORACIC TEN AND THORACIC TEN-THORACIC ELEVEN, EXTENSION OF THORACOLUMBAR FUSION FROM THORACIC TEN TO THORACIC FIVE, LOCAL BONE GRAFT, ALLOGRAFT AND VIVIGEN;  Surgeon: Jessy Oto, MD;  Location: [facility];  Service: Orthopedics;  Laterality: N/A;  DECOMPRESSIVE LAMINECTOMY THORACIC NINE-THORACIC TEN AND THORACIC TEN-THORACIC ELEVEN, EXTENSION OF TH   SPINAL FUSION N/A 03/12/2019   Procedure: REVISION OF HARDWARE THORACIC TEN-THORACIC ELEVEN WITH APPLICATION OF ADDITIONAL RODS AND ROD SLEEVES THORACIC EIGHT-THORACIC TWELVE 12-LUMBAR ONE;  Surgeon: Jessy Oto, MD;  Location: [facility];  Service: Orthopedics;  Laterality: N/A;   TONSILLECTOMY     as child   torn rotator cuff  2010   right   VIDEO BRONCHOSCOPY N/A 11/17/2020   Procedure: VIDEO BRONCHOSCOPY WITH FLUORO;  Surgeon: Laurin Coder, MD;  Location: [facility];  Service: Endoscopy;  Laterality: N/A;   Family History Family History  Problem Relation Age of Onset   Hypertension Mother    Diabetes Mother        type 2   Cancer Mother 24       breast in remission   Emphysema Father        smoker   COPD Father        smoker   Stroke Father 38       mini   Heart disease Father    Hypertension Sister    Hyperlipidemia Sister    Scoliosis Sister    Osteoporosis Sister    Hypertension Maternal Grandmother    Scoliosis Maternal Grandmother    Heart disease Maternal Grandfather    Heart disease Paternal Grandfather        smoker   Heart disease Daughter     Social  History Social History   Tobacco Use   Smoking status: Former    Packs/day: 1.00    Years: 20.00    Pack years: 20.00    Types: Cigarettes    Quit date: 07/18/1989    Years since quitting: 32.0   Smokeless tobacco: Never  Vaping Use   Vaping Use: Never used  Substance Use Topics   Alcohol use: Not Currently   Drug use: No   Allergies Daptomycin  Review of Systems Review of Systems  Respiratory:  Positive for cough and shortness of breath.    Physical Exam Vital Signs  I have reviewed the triage vital signs BP 126/90    Pulse (!) 108    Temp 97.7 F (36.5 C) (Oral)    Resp 20    Ht _0  (1.778 m)    Wt 74.8 kg    SpO2 99%    BMI 23.68 kg/m   Physical Exam Vitals and nursing note reviewed.  Constitutional:      General: He is not in acute distress.    Appearance: He is well-developed.  HENT:     Head: Normocephalic and atraumatic.  Eyes:     Conjunctiva/sclera: Conjunctivae normal.  Cardiovascular:     Rate and Rhythm: Regular rhythm. Tachycardia present.     Heart sounds: No murmur heard. Pulmonary:     Effort: Pulmonary effort is normal. Tachypnea present. No respiratory distress.  Breath sounds: Normal breath sounds.  Abdominal:     Palpations: Abdomen is soft.     Tenderness: There is no abdominal tenderness.  Musculoskeletal:        General: No swelling.     Cervical back: Neck supple.  Skin:    General: Skin is warm and dry.     Capillary Refill: Capillary refill takes less than 2 seconds.  Neurological:     Mental Status: He is alert.  Psychiatric:        Mood and Affect: Mood normal.    ED Results and Treatments Labs (all labs ordered are listed, but only abnormal results are displayed) Labs Reviewed  COMPREHENSIVE METABOLIC PANEL - Abnormal; Notable for the following components:      Result Value   Sodium 125 (*)    Chloride 90 (*)    Glucose, Bld 381 (*)    Calcium 8.8 (*)    Total Protein 6.4 (*)    All other components within  normal limits  CBC WITH DIFFERENTIAL/PLATELET - Abnormal; Notable for the following components:   WBC 11.6 (*)    Neutro Abs 10.6 (*)    Lymphs Abs 0.4 (*)    Abs Immature Granulocytes 0.09 (*)    All other components within normal limits  LACTIC ACID, PLASMA - Abnormal; Notable for the following components:   Lactic Acid, Venous 4.1 (*)    All other components within normal limits  RESP PANEL BY RT-PCR (FLU A&B, COVID) ARPGX2  CULTURE, BLOOD (ROUTINE X 2)  CULTURE, BLOOD (ROUTINE X 2)  URINE CULTURE  BLOOD GAS, VENOUS  LACTIC ACID, PLASMA  PROTIME-INR  APTT  URINALYSIS, ROUTINE W REFLEX MICROSCOPIC  NA AND K (SODIUM & POTASSIUM), RAND UR  TROPONIN I (HIGH SENSITIVITY)  TROPONIN I (HIGH SENSITIVITY)                                                                                                                          Radiology CT Angio Chest PE W and/or Wo Contrast  Result Date: 08/07/2021 CLINICAL DATA:  Known PE on Lovenox, new oxygen requirement, concern for clot progression, additional history of pill aspiration, concern for retained fragment EXAM: CT ANGIOGRAPHY CHEST WITH CONTRAST TECHNIQUE: Multidetector CT imaging of the chest was performed using the standard protocol during bolus administration of intravenous contrast. Multiplanar CT image reconstructions and MIPs were obtained to evaluate the vascular anatomy. RADIATION DOSE REDUCTION: This exam was performed according to the departmental dose-optimization program which includes automated exposure control, adjustment of the mA and/or kV according to patient size and/or use of iterative reconstruction technique. CONTRAST:  69m OMNIPAQUE IOHEXOL 350 MG/ML SOLN COMPARISON:  CT chest angiogram, 07/15/2021 FINDINGS: Cardiovascular: Satisfactory opacification of the pulmonary arteries to the segmental level. No evidence of pulmonary embolism. Normal heart size. Left coronary artery calcifications. No pericardial effusion. Scattered  aortic atherosclerosis. Mediastinum/Nodes: No enlarged mediastinal, hilar, or axillary lymph nodes. Thyroid gland, trachea, and esophagus demonstrate no significant  findings. Lungs/Pleura: Extensive bilateral heterogeneous and ground-glass airspace disease throughout the lungs is in general improved, particularly in the left lung, however there are new, scattered ground-glass airspace opacities throughout the right lung (series 6, image 101). Mild underlying centrilobular and paraseptal emphysema. No pleural effusion or pneumothorax. Upper Abdomen: No acute abnormality. Musculoskeletal: No chest wall abnormality. No acute osseous findings. Review of the MIP images confirms the above findings. IMPRESSION: 1. Negative examination for pulmonary embolism. Previously seen segmental to subsegmental embolus in the right lung is resolved. 2. Extensive bilateral heterogeneous and ground-glass airspace disease throughout the lungs is in general improved, particularly in the left lung, however there are new, scattered ground-glass airspace opacities throughout the right lung. Findings are consistent with ongoing infection or inflammation. 3. Emphysema. 4. Coronary artery disease. Aortic Atherosclerosis (ICD10-I70.0) and Emphysema (ICD10-J43.9). Electronically Signed   By: Delanna Ahmadi M.D.   On: 08/07/2021 16:36   DG Chest Portable 1 View  Result Date: 08/07/2021 CLINICAL DATA:  76 year old male with shortness of breath and cough. EXAM: PORTABLE CHEST 1 VIEW COMPARISON:  07/22/2021 and prior radiographs FINDINGS: Cardiomediastinal silhouette is unchanged. LEFT lung airspace disease has improved. No pneumothorax or large pleural effusion noted. The RIGHT lung is clear. Thoracolumbar surgical hardware again noted. IMPRESSION: Improved LEFT lung airspace since 07/22/2021. No other new findings. Electronically Signed   By: Margarette Canada M.D.   On: 08/07/2021 15:39    Pertinent labs & imaging results that were available during  my care of the patient were reviewed by me and considered in my medical decision making (see MDM for details).  Medications Ordered in ED Medications  sodium chloride 0.9 % bolus 1,000 mL (1,000 mLs Intravenous New Bag/Given 08/07/21 1647)  cefTRIAXone (ROCEPHIN) 2 g in sodium chloride 0.9 % 100 mL IVPB (has no administration in time range)  azithromycin (ZITHROMAX) 500 mg in sodium chloride 0.9 % 250 mL IVPB (has no administration in time range)  iohexol (OMNIPAQUE) 350 MG/ML injection 75 mL (75 mLs Intravenous Contrast Given 08/07/21 1620)                                                                                                                                     Procedures .Critical Care Performed by: Teressa Lower, MD Authorized by: Teressa Lower, MD   Critical care provider statement:    Critical care time (minutes):  30   Critical care was necessary to treat or prevent imminent or life-threatening deterioration of the following conditions:  Sepsis and respiratory failure   Critical care was time spent personally by me on the following activities:  Development of treatment plan with patient or surrogate, discussions with consultants, evaluation of patient's response to treatment, examination of patient, ordering and review of laboratory studies, ordering and review of radiographic studies, ordering and performing treatments and interventions, pulse oximetry, re-evaluation of patient's condition and review of old charts  (including critical care  time)  Medical Decision Making / ED Course   This patient presents to the ED for concern of shortness of breath, this involves an extensive number of treatment options, and is a complaint that carries with it a high risk of complications and morbidity.  The differential diagnosis includes pneumonia, PE, aspiration, retained pill fragment, COPD exacerbation  MDM: Patient seen emergency department for evaluation of shortness of breath.   On initial presentation, patient with frequent coughing and tachypnea with tachycardia.  Physical exam largely unremarkable but examination of the patient's sputum does reveal small pill fragments.  Chest x-ray shows improving left-sided pneumonia.  Laboratory evaluation with a leukocytosis to 11.6, sodium worsening and downtrending at 125, hypochloremia to 90, high-sensitivity troponin unremarkable, VBG with a pH of 7.37, lactate elevated to 4.1.  Sepsis alert called and patient started on ceftriaxone as a throw due to suspected pulmonary source.  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  CT PE obtained that shows a completely resolved pulmonary embolus but worsening right-sided pneumonia.  Patient currently on 4 L nasal cannula and I suspect he will likely improve overnight back to his baseline but will require admission for sepsis, new oxygen requirement and pneumonia.   Additional history obtained: -Additional history obtained from wife -External records from outside source obtained and reviewed including: Chart review including previous notes, labs, imaging, consultation notes   Lab Tests: -I ordered, reviewed, and interpreted labs.   The pertinent results include:   Labs Reviewed  COMPREHENSIVE METABOLIC PANEL - Abnormal; Notable for the following components:      Result Value   Sodium 125 (*)    Chloride 90 (*)    Glucose, Bld 381 (*)    Calcium 8.8 (*)    Total Protein 6.4 (*)    All other components within normal limits  CBC WITH DIFFERENTIAL/PLATELET - Abnormal; Notable for the following components:   WBC 11.6 (*)    Neutro Abs 10.6 (*)    Lymphs Abs 0.4 (*)    Abs Immature Granulocytes 0.09 (*)    All other components within normal limits  LACTIC ACID, PLASMA - Abnormal; Notable for the following  components:   Lactic Acid, Venous 4.1 (*)    All other components within normal limits  RESP PANEL BY RT-PCR (FLU A&B, COVID) ARPGX2  CULTURE, BLOOD (ROUTINE X 2)  CULTURE, BLOOD (ROUTINE X 2)  URINE CULTURE  BLOOD GAS, VENOUS  LACTIC ACID, PLASMA  PROTIME-INR  APTT  URINALYSIS, ROUTINE W REFLEX MICROSCOPIC  NA AND K (SODIUM & POTASSIUM), RAND UR  TROPONIN I (HIGH SENSITIVITY)  TROPONIN I (HIGH SENSITIVITY)      EKG Interpretation  Date/Time:  Saturday August 07 2021 15:07:50 EST Ventricular Rate:  120 PR Interval:  120 QRS Duration: 95 QT Interval:  307 QTC Calculation: 434 R Axis:   -63 Text Interpretation: Sinus tachycardia R atrial enlargement Incomplete left bundle branch block  Confirmed by [place] ([id number]) on 08/07/2021 5:16:06 PM           Imaging Studies ordered: I ordered imaging studies including CXR, CTPE I independently visualized and interpreted imaging. I agree with the radiologist interpretation   Medicines ordered and prescription drug management: Meds ordered this encounter  Medications   iohexol (OMNIPAQUE) 350 MG/ML injection 75 mL   DISCONTD: lactated ringers infusion  sodium chloride 0.9 % bolus 1,000 mL    Order Specific Question:   Reason 30 mL/kg dose is not being ordered    Answer:   Non-Septic Shock Clinical Presentation   cefTRIAXone (ROCEPHIN) 2 g in sodium chloride 0.9 % 100 mL IVPB    Order Specific Question:   Antibiotic Indication:    Answer:   CAP   azithromycin (ZITHROMAX) 500 mg in sodium chloride 0.9 % 250 mL IVPB    Order Specific Question:   Antibiotic Indication:    Answer:   CAP    -I have reviewed the patients home medicines and have made adjustments as needed  Critical interventions Multiple antibiotics, supplemental oxygen, fluid resuscitation   Cardiac Monitoring: The patient was maintained on a cardiac monitor.  I personally viewed and interpreted the cardiac monitored which showed an underlying rhythm  of: sinus tachycardia  Social Determinants of Health:  Factors impacting patients care include: none   Reevaluation: After the interventions noted above, I reevaluated the patient and found that they have :improved  Co morbidities that complicate the patient evaluation  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   Esophageal reflux 02/10/2015   Fatigue    HTN (hypertension)    Hyperglycemia    preDM    Hyperlipidemia    Insomnia    Low back pain radiating to both legs 01/16/2017   Measles as a child   Overweight(278.02)    Peripheral neuropathy 08/07/2019   Personal history of colonic polyps 10/27/2012   Follows with [facility]   Pneumonia    " walking"   Preventative health care    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

## 2021-08-07 NOTE — Assessment & Plan Note (Addendum)
Unlikely diagnosis. Patient initially met criteria on admission with possible pneumonia source, although infiltrates may not reflect infectious etiology. Blood and urine cultures obtained. Ceftriaxone and azithromycin given for possible pneumonia. IV fluid bolus given for lactic acidosis. Patient with associated tachypnea and tachycardia that could be explained by aspiration event as well.

## 2021-08-07 NOTE — Assessment & Plan Note (Signed)
>>  ASSESSMENT AND PLAN FOR TESTOSTERONE DEFICIENCY WRITTEN ON 08/07/2021  6:28 PM BY NETTEY, RALPH A, MD  Noted.

## 2021-08-07 NOTE — Sepsis Progress Note (Signed)
Secure chat with bedside nurse and requested repeat lactic acid to be drawn before the patient moves to the floor.

## 2021-08-07 NOTE — Assessment & Plan Note (Addendum)
Accidental aspiration of a pill. Resultant pulmonary infiltrates initially concerning for possible pneumonia. Pill fragments noted on sputum.  Patient received started empirically on Ceftriaxone and Azithromycin. Blood cultures obtained. Prior to aspiration, patient had no symptoms consistent with pneumonia. Weaned back to chronic 2 L/min O2. Procalcitonin negative. Blood cultures with no growth.

## 2021-08-07 NOTE — H&P (Signed)
History and Physical    LUVERNE Brown WOB:[id number] DOB: 04/15/1946 DOA: 08/07/2021  PCP: Luis Richters, PA-C Patient coming from: Home  Chief Complaint: Trouble breathing  HPI: Luis Brown is a 76 y.o. male with medical history significant of COPD, diabetes mellitus type 2, hyperlipidemia, depression, recent pulmonary embolism on Lovenox, chronic back pain. Patient presented secondary to trouble breathing after aspirating a vitamin this afternoon. He reports significant dyspnea after the event. EMS was called for help and patient was eventually transported to the ED for evaluation. He does report some worsening dyspnea on exertion over the past few days.   ED Course: Vitals: Temperature of 97.7 F, pulse of 112 bpm, respirations of 22/min, blood pressure 145/69, SPO2 of 91% on 4 L/min of oxygen Labs: Sodium of 125, chloride of 90, glucose of 381 Imaging: CTA of the chest significant for resolved right lung pulmonary embolism generally improved bilateral groundglass airspace disease, new scattered groundglass opacities throughout the right lung. Medications/Course: Normal saline 1 L bolus, ceftriaxone, azithromycin  Review of Systems: Review of Systems  Constitutional:  Negative for chills and fever.  Respiratory:  Positive for cough and shortness of breath.   Cardiovascular:  Negative for chest pain.  Gastrointestinal:  Negative for abdominal pain, constipation, diarrhea, nausea and vomiting.  All other systems reviewed and are negative.  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   Esophageal reflux 02/10/2015   Fatigue    HTN (hypertension)    Hyperglycemia    preDM    Hyperlipidemia    Insomnia    Low back pain radiating to both legs 01/16/2017   Measles as a child   Overweight(278.02)    Peripheral neuropathy 08/07/2019   Personal history of colonic polyps 10/27/2012   Follows with [facility]   Pneumonia    " walking"   Preventative health care    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   Wears glasses     Past Surgical History:  Procedure Laterality Date   BACK SURGERY  2012 and 1994   Dr Charlesetta Garibaldi, screws and cage in low back   BIOPSY  01/09/2020   Procedure: BIOPSY;  Surgeon: Kathi Der, MD;  Location: [facility];  Service: Gastroenterology;;   BRONCHIAL BIOPSY  11/17/2020   Procedure: BRONCHIAL BIOPSIES;  Surgeon: Tomma Lightning, MD;  Location: [facility];  Service: Endoscopy;;   BRONCHIAL BRUSHINGS  11/17/2020   Procedure: BRONCHIAL BRUSHINGS;  Surgeon: Tomma Lightning, MD;  Location: [facility];  Service: Endoscopy;;   BRONCHIAL WASHINGS  11/17/2020   Procedure: BRONCHIAL WASHINGS;  Surgeon: Tomma Lightning, MD;  Location: [facility];  Service: Endoscopy;;   COLONOSCOPY WITH PROPOFOL N/A 01/09/2020   Procedure: COLONOSCOPY WITH PROPOFOL;  Surgeon: Kathi Der, MD;  Location: [facility];  Service: Gastroenterology;  Laterality: N/A;   ESOPHAGOGASTRODUODENOSCOPY (EGD) WITH PROPOFOL N/A 01/09/2020   Procedure: ESOPHAGOGASTRODUODENOSCOPY (EGD) WITH PROPOFOL;  Surgeon: Kathi Der, MD;  Location: [facility];  Service: Gastroenterology;  Laterality: N/A;  EYE SURGERY Bilateral    2016   HARDWARE REVISION  03/12/2019   REVISION OF HARDWARE THORACIC TEN-THORACIC ELEVEN WITH APPLICATION OF ADDITIONAL RODS AND ROD SLEEVES THORACIC EIGHT-THORACIC TWELVE 12-LUMBAR ONE (N/A )   HEMOSTASIS CONTROL  11/17/2020   Procedure: HEMOSTASIS CONTROL;  Surgeon: Laurin Coder, MD;   Location: [facility];  Service: Endoscopy;;   IR US GUIDE BX ASP/DRAIN  11/13/2020   LAMINECTOMY  02/12/2019   DECOMPRESSIVE LAMINECTOMY THORACIC NINE-THORACIC TEN AND THORACIC TEN-THORACIC ELEVEN, EXTENSION OF THORACOLUMBAR FUSION FROM THORACIC TEN TO THORACIC FIVE, LOCAL BONE GRAFT, ALLOGRAFT AND VIVIGEN (N/A)   POLYPECTOMY  01/09/2020   Procedure: POLYPECTOMY;  Surgeon: Otis Brace, MD;  Location: [facility];  Service: Gastroenterology;;   REMOVAL OF RODS AND PEDICLE SCREWS T5 AND T6, EXPLORATION OF FUSION, BIOPSY TRANSPEDICULAR T5 AND T6 (N/A )  09/14/2020   SPINAL FUSION N/A 02/12/2019   Procedure: DECOMPRESSIVE LAMINECTOMY THORACIC NINE-THORACIC TEN AND THORACIC TEN-THORACIC ELEVEN, EXTENSION OF THORACOLUMBAR FUSION FROM THORACIC TEN TO THORACIC FIVE, LOCAL BONE GRAFT, ALLOGRAFT AND VIVIGEN;  Surgeon: Jessy Oto, MD;  Location: [place];  Service: Orthopedics;  Laterality: N/A;  DECOMPRESSIVE LAMINECTOMY THORACIC NINE-THORACIC TEN AND THORACIC TEN-THORACIC ELEVEN, EXTENSION OF TH   SPINAL FUSION N/A 03/12/2019   Procedure: REVISION OF HARDWARE THORACIC TEN-THORACIC ELEVEN WITH APPLICATION OF ADDITIONAL RODS AND ROD SLEEVES THORACIC EIGHT-THORACIC TWELVE 12-LUMBAR ONE;  Surgeon: Jessy Oto, MD;  Location: [place];  Service: Orthopedics;  Laterality: N/A;   TONSILLECTOMY     as child   torn rotator cuff  2010   right   VIDEO BRONCHOSCOPY N/A 11/17/2020   Procedure: VIDEO BRONCHOSCOPY WITH FLUORO;  Surgeon: Laurin Coder, MD;  Location: [facility];  Service: Endoscopy;  Laterality: N/A;     reports that he quit smoking about 32 years ago. His smoking use included cigarettes. He has a 20.00 pack-year smoking history. He has never used smokeless tobacco. He reports that he does not currently use alcohol. He reports that he does not use drugs.  Allergies  Allergen Reactions   Daptomycin     Drug induced pneumonia     Family History  Problem Relation Age of Onset    Hypertension Mother    Diabetes Mother        type 2   Cancer Mother 35       breast in remission   Emphysema Father        smoker   COPD Father        smoker   Stroke Father 47       mini   Heart disease Father    Hypertension Sister    Hyperlipidemia Sister    Scoliosis Sister    Osteoporosis Sister    Hypertension Maternal Grandmother    Scoliosis Maternal Grandmother    Heart disease Maternal Grandfather    Heart disease Paternal Grandfather        smoker   Heart disease Daughter    Prior to Admission medications   Medication Sig Start Date End Date Taking? Authorizing Provider  albuterol (PROVENTIL HFA) 108 (90 Base) MCG/ACT inhaler TAKE 2 PUFFS BY MOUTH EVERY 6 HOURS AS NEEDED FOR WHEEZE OR SHORTNESS OF BREATH Patient taking differently: 2 puffs every 6 (six) hours as needed for wheezing. TAKE 2 PUFFS BY MOUTH EVERY 6 HOURS AS NEEDED FOR WHEEZE OR SHORTNESS OF BREATH 05/03/21   Brand Males, MD  atorvastatin (LIPITOR) 10 MG tablet TAKE 1 TABLET BY MOUTH EVERY  DAY 07/23/21   Saguier, Percell Miller, PA-C  DALVANCE 500 MG SOLR Inject 500 mg into the vein every 14 (fourteen) days. Pt states this is a every 2 week antibiotic. Could not state how long therapy should be. 06/04/21   [provider]  digoxin (LANOXIN) 0.125 MG tablet Take 1 tablet (0.125 mg total) by mouth daily. 01/22/21   Tobb, Kardie, DO  enoxaparin (LOVENOX) 80 MG/0.8ML injection Inject 0.8 mLs (80 mg total) into the skin every 12 (twelve) hours. 07/24/21   Samuella Cota, MD  enoxaparin (LOVENOX) KIT Dispense Lovenox education kit 07/24/21   Samuella Cota, MD  ferrous fumarate (FERRETTS) 325 (106 Fe) MG TABS tablet Take 1 tablet (106 mg of iron total) by mouth daily. 10/28/20   Saguier, Percell Miller, PA-C  glucose blood (CONTOUR NEXT TEST) test strip Check blood sugar once daily 02/25/19   Saguier, Percell Miller, PA-C  metFORMIN (GLUCOPHAGE) 1000 MG tablet TAKE 1 TABLET (1,000 MG TOTAL) BY MOUTH 2 (TWO) TIMES DAILY WITH  A MEAL. 06/18/21   Saguier, Percell Miller, PA-C  oxyCODONE-acetaminophen (PERCOCET/ROXICET) 5-325 MG tablet Take 1 tablet by mouth every 4 (four) hours as needed. 06/14/21   [provider]  pantoprazole (PROTONIX) 40 MG tablet TAKE 1 TABLET BY MOUTH TWICE A DAY BEFORE A MEAL Patient taking differently: 40 mg 2 (two) times daily before a meal. 12/21/20   Saguier, Percell Miller, PA-C  predniSONE (DELTASONE) 10 MG tablet Take 4 tablets (40 mg total) by mouth daily with breakfast for 10 days, THEN 3 tablets (30 mg total) daily with breakfast for 10 days, THEN 2 tablets (20 mg total) daily with breakfast for 10 days, THEN 1 tablet (10 mg total) daily with breakfast for 10 days. 07/24/21 09/02/21  Samuella Cota, MD  pregabalin (LYRICA) 75 MG capsule TAKE 1 CAPSULE BY MOUTH TWICE A DAY Patient taking differently: Take 75 mg by mouth 2 (two) times daily. 03/09/20   Jessy Oto, MD  sitaGLIPtin (JANUVIA) 50 MG tablet Take 1 tablet (50 mg total) by mouth daily. 06/18/21   Saguier, Percell Miller, PA-C  STIOLTO RESPIMAT 2.5-2.5 MCG/ACT AERS Inhale 2 puffs into the lungs daily. 06/18/21   [provider]  tamsulosin (FLOMAX) 0.4 MG CAPS capsule Take 2 capsules (0.8 mg total) by mouth daily. 05/24/21   Saguier, Percell Miller, PA-C  testosterone cypionate (DEPOTESTOSTERONE CYPIONATE) 200 MG/ML injection INJECT 1 ML (200 MG TOTAL) INTO THE MUSCLE EVERY 14 DAYS Patient taking differently: Inject 200 mg into the muscle every 14 (fourteen) days. 05/24/21   Saguier, Percell Miller, PA-C  tiZANidine (ZANAFLEX) 4 MG tablet TAKE 1.5 TABLETS (6 MG TOTAL) BY MOUTH EVERY 6 (SIX) HOURS AS NEEDED FOR MUSCLE SPASMS. 07/13/21   Jessy Oto, MD  venlafaxine XR (EFFEXOR-XR) 150 MG 24 hr capsule TAKE 1 CAPSULE BY MOUTH DAILY WITH BREAKFAST. Patient taking differently: 150 mg daily with breakfast. 07/15/21   Saguier, Percell Miller, PA-C  zolpidem (AMBIEN) 5 MG tablet Take 1 tablet (5 mg total) by mouth at bedtime as needed. for sleep 07/27/21   Mackie Pai, PA-C    Physical Exam:  Physical Exam Constitutional:      General: He is not in acute distress.    Appearance: He is not diaphoretic.  Eyes:     Conjunctiva/sclera: Conjunctivae normal.     Pupils: Pupils are equal, round, and reactive to light.  Cardiovascular:     Rate and Rhythm: Regular rhythm. Tachycardia present.     Heart sounds: Normal heart sounds. No murmur heard.  Pulmonary:     Effort: Pulmonary effort is normal. No respiratory distress.     Breath sounds: Examination of the left-middle field reveals rhonchi and rales. Examination of the left-lower field reveals rhonchi and rales. Rhonchi and rales present. No wheezing.  Abdominal:     General: Bowel sounds are normal. There is no distension.     Palpations: Abdomen is soft.     Tenderness: There is no abdominal tenderness. There is no guarding or rebound.  Musculoskeletal:        General: No tenderness. Normal range of motion.     Cervical back: Normal range of motion.  Lymphadenopathy:     Cervical: No cervical adenopathy.  Skin:    General: Skin is warm and dry.  Neurological:     Mental Status: He is alert and oriented to person, place, and time.    Labs on Admission: I have personally reviewed following labs and imaging studies  CBC: Recent Labs  Lab 08/04/21 1046 08/07/21 1511  WBC 7.3 11.6*  NEUTROABS 6.1 10.6*  HGB 13.5 14.1  HCT 40.7 41.6  MCV 89.8 89.3  PLT 263 353    Basic Metabolic Panel: Recent Labs  Lab 08/04/21 1046 08/07/21 1511  NA 129* 125*  K 4.8 4.5  CL 88* 90*  CO2 34* 24  GLUCOSE 454* 381*  BUN 20 23  CREATININE 0.91 0.75  CALCIUM 10.1 8.8*    GFR: Estimated Creatinine Clearance: 79.8 mL/min (by C-G formula based on SCr of 0.75 mg/dL).  Liver Function Tests: Recent Labs  Lab 08/04/21 1046 08/07/21 1511  AST 11* 24  ALT 8 16  ALKPHOS 64 54  BILITOT 0.6 1.1  PROT 6.4* 6.4*  ALBUMIN 4.3 4.0   No results for input(s): LIPASE, AMYLASE in the last 168  hours. No results for input(s): AMMONIA in the last 168 hours.  Coagulation Profile: No results for input(s): INR, PROTIME in the last 168 hours.  Cardiac Enzymes: No results for input(s): CKTOTAL, CKMB, CKMBINDEX, TROPONINI in the last 168 hours.  BNP (last 3 results) No results for input(s): PROBNP in the last 8760 hours.  HbA1C: No results for input(s): HGBA1C in the last 72 hours.  CBG: No results for input(s): GLUCAP in the last 168 hours.  Lipid Profile: No results for input(s): CHOL, HDL, LDLCALC, TRIG, CHOLHDL, LDLDIRECT in the last 72 hours.  Thyroid Function Tests: No results for input(s): TSH, T4TOTAL, FREET4, T3FREE, THYROIDAB in the last 72 hours.  Anemia Panel: No results for input(s): VITAMINB12, FOLATE, FERRITIN, TIBC, IRON, RETICCTPCT in the last 72 hours.  Urine analysis:    Component Value Date/Time   COLORURINE YELLOW 07/15/2021 1636   APPEARANCEUR CLEAR 07/15/2021 1636   LABSPEC 1.024 07/15/2021 1636   PHURINE 6.0 07/15/2021 1636   GLUCOSEU >=500 (A) 07/15/2021 1636   HGBUR SMALL (A) 07/15/2021 1636   BILIRUBINUR NEGATIVE 07/15/2021 1636   BILIRUBINUR negative 10/08/2019 1430   KETONESUR NEGATIVE 07/15/2021 1636   PROTEINUR NEGATIVE 07/15/2021 1636   UROBILINOGEN negative (A) 10/08/2019 1430   UROBILINOGEN 0.2 04/08/2011 0956   NITRITE NEGATIVE 07/15/2021 1636   LEUKOCYTESUR NEGATIVE 07/15/2021 1636     Radiological Exams on Admission: CT Angio Chest PE W and/or Wo Contrast  Result Date: 08/07/2021 CLINICAL DATA:  Known PE on Lovenox, new oxygen requirement, concern for clot progression, additional history of pill aspiration, concern for retained fragment EXAM: CT ANGIOGRAPHY CHEST WITH CONTRAST TECHNIQUE: Multidetector CT imaging of the chest was performed using the standard  protocol during bolus administration of intravenous contrast. Multiplanar CT image reconstructions and MIPs were obtained to evaluate the vascular anatomy. RADIATION DOSE  REDUCTION: This exam was performed according to the departmental dose-optimization program which includes automated exposure control, adjustment of the mA and/or kV according to patient size and/or use of iterative reconstruction technique. CONTRAST:  55mL OMNIPAQUE IOHEXOL 350 MG/ML SOLN COMPARISON:  CT chest angiogram, 07/15/2021 FINDINGS: Cardiovascular: Satisfactory opacification of the pulmonary arteries to the segmental level. No evidence of pulmonary embolism. Normal heart size. Left coronary artery calcifications. No pericardial effusion. Scattered aortic atherosclerosis. Mediastinum/Nodes: No enlarged mediastinal, hilar, or axillary lymph nodes. Thyroid gland, trachea, and esophagus demonstrate no significant findings. Lungs/Pleura: Extensive bilateral heterogeneous and ground-glass airspace disease throughout the lungs is in general improved, particularly in the left lung, however there are new, scattered ground-glass airspace opacities throughout the right lung (series 6, image 101). Mild underlying centrilobular and paraseptal emphysema. No pleural effusion or pneumothorax. Upper Abdomen: No acute abnormality. Musculoskeletal: No chest wall abnormality. No acute osseous findings. Review of the MIP images confirms the above findings. IMPRESSION: 1. Negative examination for pulmonary embolism. Previously seen segmental to subsegmental embolus in the right lung is resolved. 2. Extensive bilateral heterogeneous and ground-glass airspace disease throughout the lungs is in general improved, particularly in the left lung, however there are new, scattered ground-glass airspace opacities throughout the right lung. Findings are consistent with ongoing infection or inflammation. 3. Emphysema. 4. Coronary artery disease. Aortic Atherosclerosis (ICD10-I70.0) and Emphysema (ICD10-J43.9). Electronically Signed   By: Delanna Ahmadi M.D.   On: 08/07/2021 16:36   DG Chest Portable 1 View  Result Date:  08/07/2021 CLINICAL DATA:  76 year old male with shortness of breath and cough. EXAM: PORTABLE CHEST 1 VIEW COMPARISON:  07/22/2021 and prior radiographs FINDINGS: Cardiomediastinal silhouette is unchanged. LEFT lung airspace disease has improved. No pneumothorax or large pleural effusion noted. The RIGHT lung is clear. Thoracolumbar surgical hardware again noted. IMPRESSION: Improved LEFT lung airspace since 07/22/2021. No other new findings. Electronically Signed   By: Margarette Canada M.D.   On: 08/07/2021 15:39    EKG: Independently reviewed. Sinus tachycardia  Assessment/Plan  * Aspiration pneumonitis (HCC)- (present on admission) Accidental aspiration of a pill. Resultant pulmonary infiltrates initially concerning for possible pneumonia. Pill fragments noted on sputum.  Patient received started empirically on Ceftriaxone and Azithromycin. Blood cultures obtained. Prior to aspiration, patient had no symptoms consistent with pneumonia. Weaned back to chronic 2 L/min O2 -Hold antibiotics for now -Follow-up blood cultures  Severe sepsis ([place])- (present on admission) Possible diagnosis. Patient met criteria on admission with possible pneumonia source, although infiltrates may not reflect infectious etiology. Blood and urine cultures obtained. Ceftriaxone and azithromycin given for possible pneumonia. IV fluid bolus given for lactic acidosis. Patient with associated tachypnea and tachycardia that could be explained by aspiration event as well. -Follow-up culture data  Chronic respiratory failure with hypoxia ([place]) Secondary to recent pneumonia/pulmonary embolism/interstitial lung disease.  Patient with plans to follow-up with Dr. Chase Caller as an outpatient.  Patient has been stable on 2 L/min of oxygen. -Continue oxygen therapy at 2 L/min  Lactic acidosis Initial concern for possible sepsis etiology. Patient is on albuterol and metformin as an outpatient as well. Given a 1L NS bolus in the ED -Repeat  lactic acid  Pulmonary emboli ([place])- (present on admission) Previously diagnosed.  Repeat CTA this admission shows resolution of pulmonary embolism.  Patient is been stable on Lovenox. -Continue Lovenox twice daily  Paroxysmal A-fib (  [place])- (present on admission) Noted.  Currently in sinus rhythm.  Not on rate control.  Patient is on digoxin.  Patient is also on Lovenox for VTE treatment.  Hyponatremia- (present on admission) Patient with chronically mild hyponatremia.  Sodium of 125 on admission with a corrected sodium of 129 in setting of hyperglycemia.  Patient given 1 L of normal saline in the emergency department for lactic acidosis and concern for possible sepsis.  Urine sodium is high with urine osmolality pending. -Follow-up urine osmolality  Diabetes mellitus with hyperglycemia ([place])- (present on admission) Patient is on metformin and Januvia as an outpatient.  Complicated by prednisone taper.  Presented with hyperglycemia.  Last hemoglobin A1c of 7.4%. -Moderate sliding scale insulin  COPD (chronic obstructive pulmonary disease) ([place])- (present on admission) Stable.  No wheezing on exam. -Albuterol as needed  Painful orthopaedic hardware [facility])- (present on admission) Patient has resultant chronic pain.  Patient follows with a pain management clinic.  He is currently on Percocet 5-325 mg every 4 hours as needed, Lyrica 75 mg twice daily -Continue home regimen as stated above  Testosterone deficiency- (present on admission) Noted.     DVT prophylaxis: Lovenox (DVT treatment) Code Status: Full code Family Communication: Wife at bedside Disposition Plan: Medical floor.  Anticipate discharge home likely in 1 to 2 days. Consults called: None Admission status: Observation   Cordelia Poche, MD [facility] 08/07/2021, 5:12 PM

## 2021-08-07 NOTE — Assessment & Plan Note (Addendum)
Patient is on metformin and Januvia as an outpatient.  Complicated by prednisone taper.  Presented with hyperglycemia.  Last hemoglobin A1c of 7.4%. Discharged on Lantus 10 units daily while on prednisone.

## 2021-08-07 NOTE — Assessment & Plan Note (Addendum)
Patient with chronically mild hyponatremia.  Sodium of 125 on admission with a corrected sodium of 129 in setting of hyperglycemia.  Patient given 1 L of normal saline in the emergency department for lactic acidosis and concern for possible sepsis. Hyponatremia improved to 128 with correct sodium of about 132-134.

## 2021-08-07 NOTE — Assessment & Plan Note (Addendum)
Secondary to recent pneumonia/pulmonary embolism/interstitial lung disease.  Patient with plans to follow-up with Dr. Chase Caller as an outpatient.  Patient has been stable on 2 L/min of oxygen. Continue oxygen on discharge.

## 2021-08-07 NOTE — Sepsis Progress Note (Signed)
Notified bedside nurse to contact night MD coverage of need to order repeat lactic acid per sepsis protocol.

## 2021-08-07 NOTE — ED Triage Notes (Signed)
Patient states he choked at home trying to take a vitamin.

## 2021-08-07 NOTE — Assessment & Plan Note (Addendum)
Noted.  Currently in sinus rhythm.  Not on rate control.  Patient is on digoxin.  Patient is also on Lovenox for VTE treatment.

## 2021-08-07 NOTE — Assessment & Plan Note (Addendum)
Stable.  No wheezing on exam.

## 2021-08-07 NOTE — Assessment & Plan Note (Addendum)
Patient has resultant chronic pain.  Patient follows with a pain management clinic.  He is currently on Percocet 5-325 mg every 4 hours as needed, Lyrica 75 mg twice daily. Continue on discharge.

## 2021-08-08 ENCOUNTER — Encounter: Payer: Self-pay | Admitting: Medical

## 2021-08-08 DIAGNOSIS — E871 Hypo-osmolality and hyponatremia: Secondary | ICD-10-CM | POA: Diagnosis not present

## 2021-08-08 DIAGNOSIS — J690 Pneumonitis due to inhalation of food and vomit: Secondary | ICD-10-CM | POA: Diagnosis not present

## 2021-08-08 LAB — CBC
HCT: 34.9 % — ABNORMAL LOW (ref 39.0–52.0)
Hemoglobin: 11.8 g/dL — ABNORMAL LOW (ref 13.0–17.0)
MCH: 30.3 pg (ref 26.0–34.0)
MCHC: 33.8 g/dL (ref 30.0–36.0)
MCV: 89.7 fL (ref 80.0–100.0)
Platelets: 266 10*3/uL (ref 150–400)
RBC: 3.89 MIL/uL — ABNORMAL LOW (ref 4.22–5.81)
RDW: 14.8 % (ref 11.5–15.5)
WBC: 10.7 10*3/uL — ABNORMAL HIGH (ref 4.0–10.5)
nRBC: 0 % (ref 0.0–0.2)

## 2021-08-08 LAB — LUPUS ANTICOAGULANT PANEL
DRVVT: 31.6 s (ref 0.0–47.0)
PTT Lupus Anticoagulant: 40.1 s (ref 0.0–51.9)

## 2021-08-08 LAB — BASIC METABOLIC PANEL
Anion gap: 11 (ref 5–15)
BUN: 18 mg/dL (ref 8–23)
CO2: 24 mmol/L (ref 22–32)
Calcium: 8.4 mg/dL — ABNORMAL LOW (ref 8.9–10.3)
Chloride: 93 mmol/L — ABNORMAL LOW (ref 98–111)
Creatinine, Ser: 0.69 mg/dL (ref 0.61–1.24)
GFR, Estimated: >60 mL/min (ref >60)
Glucose, Bld: 370 mg/dL — ABNORMAL HIGH (ref 70–99)
Potassium: 4.2 mmol/L (ref 3.5–5.1)
Sodium: 128 mmol/L — ABNORMAL LOW (ref 135–145)

## 2021-08-08 LAB — LACTIC ACID, PLASMA
Lactic Acid, Venous: 3.3 mmol/L (ref 0.5–1.9)
Lactic Acid, Venous: 1.7 mmol/L (ref 0.5–1.9)

## 2021-08-08 LAB — GLUCOSE, CAPILLARY
Glucose-Capillary: 270 mg/dL — ABNORMAL HIGH (ref 70–99)
Glucose-Capillary: 165 mg/dL — ABNORMAL HIGH (ref 70–99)

## 2021-08-08 LAB — CARDIOLIPIN ANTIBODIES, IGG, IGM, IGA
Anticardiolipin IgA: <9 APL U/mL (ref 0–11)
Anticardiolipin IgG: <9 GPL U/mL (ref 0–14)
Anticardiolipin IgM: <9 MPL U/mL (ref 0–12)

## 2021-08-08 LAB — PROCALCITONIN: Procalcitonin: <0.1 ng/mL

## 2021-08-08 LAB — OSMOLALITY, URINE: Osmolality, Ur: 529 mOsm/kg (ref 300–900)

## 2021-08-08 LAB — PROTEIN S ACTIVITY: Protein S Activity: 102 % (ref 63–140)

## 2021-08-08 LAB — PROTEIN C ACTIVITY: Protein C Activity: 186 % — ABNORMAL HIGH (ref 73–180)

## 2021-08-08 LAB — PROTEIN S, TOTAL: Protein S Ag, Total: 102 % (ref 60–150)

## 2021-08-08 MED ORDER — PANTOPRAZOLE SODIUM 40 MG PO TBEC
40.0000 mg | DELAYED_RELEASE_TABLET | Freq: Two times a day (BID) | ORAL | Status: DC
  Administered 2021-08-08: 40 mg via ORAL
  Filled 2021-08-08: qty 1

## 2021-08-08 MED ORDER — ATORVASTATIN CALCIUM 10 MG PO TABS
10.0000 mg | ORAL_TABLET | Freq: Every day | ORAL | Status: DC
  Administered 2021-08-08: 10 mg via ORAL
  Filled 2021-08-08: qty 1

## 2021-08-08 MED ORDER — VENLAFAXINE HCL ER 150 MG PO CP24
150.0000 mg | ORAL_CAPSULE | Freq: Every day | ORAL | Status: DC
  Administered 2021-08-08: 150 mg via ORAL
  Filled 2021-08-08: qty 1

## 2021-08-08 MED ORDER — INSULIN GLARGINE 100 UNIT/ML SOLOSTAR PEN: 10.0000 [IU] | PEN_INJECTOR | Freq: Every day | SUBCUTANEOUS | 0 refills | Status: DC

## 2021-08-08 MED ORDER — DIGOXIN 125 MCG PO TABS
0.1250 mg | ORAL_TABLET | Freq: Every day | ORAL | Status: DC
  Administered 2021-08-08: 0.125 mg via ORAL
  Filled 2021-08-08: qty 1

## 2021-08-08 MED ORDER — "PEN NEEDLES 3/16"" 31G X 5 MM MISC": 0 refills | Status: DC

## 2021-08-08 NOTE — Plan of Care (Signed)

## 2021-08-08 NOTE — Discharge Summary (Signed)
Physician Discharge Summary  NEPHI SAVAGE GYF:[id number] DOB: [birth year] DOA: 08/07/2021  PCP: Mackie Pai, PA-C  Admit date: 08/07/2021 Discharge date: 08/08/2021  Admitted From: Home Disposition: Home  Recommendations for Outpatient Follow-up:  Follow up with PCP in 1 week Please obtain BMP/CBC in one week Please follow up on the following pending results: Blood cultures  Home Health: None Equipment/Devices: None  Discharge Condition: Stable CODE STATUS: Full code Diet recommendation: Carb modified   Brief/Interim Summary:  HPI: Luis Brown is a 76 y.o. male with medical history significant of COPD, diabetes mellitus type 2, hyperlipidemia, depression, recent pulmonary embolism on Lovenox, chronic back pain. Patient presented secondary to trouble breathing after aspirating a vitamin this afternoon. He reports significant dyspnea after the event. EMS was called for help and patient was eventually transported to the ED for evaluation. He does report some worsening dyspnea on exertion over the past few days.   Hospital course:  * Aspiration pneumonitis ([place])- (present on admission) Accidental aspiration of a pill. Resultant pulmonary infiltrates initially concerning for possible pneumonia. Pill fragments noted on sputum.  Patient received started empirically on Ceftriaxone and Azithromycin. Blood cultures obtained. Prior to aspiration, patient had no symptoms consistent with pneumonia. Weaned back to chronic 2 L/min O2. Procalcitonin negative. Blood cultures with no growth.  Severe sepsis (HCC)-resolved as of 08/08/2021, (present on admission) Unlikely diagnosis. Patient initially met criteria on admission with possible pneumonia source, although infiltrates may not reflect infectious etiology. Blood and urine cultures obtained. Ceftriaxone and azithromycin given for possible pneumonia. IV fluid bolus given for lactic acidosis. Patient with associated tachypnea and  tachycardia that could be explained by aspiration event as well.  Chronic respiratory failure with hypoxia (HCC) Secondary to recent pneumonia/pulmonary embolism/interstitial lung disease.  Patient with plans to follow-up with Dr. Chase Caller as an outpatient.  Patient has been stable on 2 L/min of oxygen. Continue oxygen on discharge.  Lactic acidosis Initial concern for possible sepsis etiology. Patient is on albuterol and metformin as an outpatient as well. Given a 1L NS bolus in the ED and another bolus while admitted with resolution of lactic acidosis.  Pulmonary emboli ([place])- (present on admission) Previously diagnosed.  Repeat CTA this admission shows resolution of pulmonary embolism.  Patient is been stable on Lovenox. Continue on discharge.  Paroxysmal A-fib ([place])- (present on admission) Noted.  Currently in sinus rhythm.  Not on rate control.  Patient is on digoxin.  Patient is also on Lovenox for VTE treatment.  Hyponatremia- (present on admission) Patient with chronically mild hyponatremia.  Sodium of 125 on admission with a corrected sodium of 129 in setting of hyperglycemia.  Patient given 1 L of normal saline in the emergency department for lactic acidosis and concern for possible sepsis. Hyponatremia improved to 128 with correct sodium of about 132-134.   Diabetes mellitus with hyperglycemia ([place])- (present on admission) Patient is on metformin and Januvia as an outpatient.  Complicated by prednisone taper.  Presented with hyperglycemia.  Last hemoglobin A1c of 7.4%. Discharged on Lantus 10 units daily while on prednisone.  COPD (chronic obstructive pulmonary disease) ([place])- (present on admission) Stable.  No wheezing on exam.  Painful orthopaedic hardware [facility])- (present on admission) Patient has resultant chronic pain.  Patient follows with a pain management clinic.  He is currently on Percocet 5-325 mg every 4 hours as needed, Lyrica 75 mg twice daily. Continue on  discharge.  Testosterone deficiency- (present on admission) Noted.     Discharge  Instructions   Allergies as of 08/08/2021       Reactions   Daptomycin    Drug induced pneumonia    Diltiazem Other (See Comments)   Passed out        Medication List     TAKE these medications    albuterol 108 (90 Base) MCG/ACT inhaler Commonly known as: Proventil HFA TAKE 2 PUFFS BY MOUTH EVERY 6 HOURS AS NEEDED FOR WHEEZE OR SHORTNESS OF BREATH   atorvastatin 10 MG tablet Commonly known as: LIPITOR TAKE 1 TABLET BY MOUTH EVERY DAY   Contour Next Test test strip Generic drug: glucose blood Check blood sugar once daily   Dalvance 500 MG Solr Generic drug: dalbavancin Inject 500 mg into the vein every 14 (fourteen) days. Pt states this is a every 2 week antibiotic. Could not state how long therapy should be.   digoxin 0.125 MG tablet Commonly known as: LANOXIN Take 1 tablet (0.125 mg total) by mouth daily.   enoxaparin 80 MG/0.8ML injection Commonly known as: LOVENOX Inject 0.8 mLs (80 mg total) into the skin every 12 (twelve) hours.   insulin glargine 100 UNIT/ML Solostar Pen Commonly known as: LANTUS Inject 10 Units into the skin daily.   metFORMIN 1000 MG tablet Commonly known as: GLUCOPHAGE TAKE 1 TABLET (1,000 MG TOTAL) BY MOUTH 2 (TWO) TIMES DAILY WITH A MEAL.   oxyCODONE-acetaminophen 5-325 MG tablet Commonly known as: PERCOCET/ROXICET Take 1 tablet by mouth every 4 (four) hours as needed for moderate pain.   pantoprazole 40 MG tablet Commonly known as: PROTONIX TAKE 1 TABLET BY MOUTH TWICE A DAY BEFORE A MEAL What changed: See the new instructions.   Pen Needles 3/16" 31G X 5 MM Misc Use with Lantus   predniSONE 10 MG tablet Commonly known as: DELTASONE Take 4 tablets (40 mg total) by mouth daily with breakfast for 10 days, THEN 3 tablets (30 mg total) daily with breakfast for 10 days, THEN 2 tablets (20 mg total) daily with breakfast for 10 days, THEN 1  tablet (10 mg total) daily with breakfast for 10 days. Start taking on: July 24, 2021   pregabalin 75 MG capsule Commonly known as: LYRICA TAKE 1 CAPSULE BY MOUTH TWICE A DAY   sitaGLIPtin 50 MG tablet Commonly known as: Januvia Take 1 tablet (50 mg total) by mouth daily.   Stiolto Respimat 2.5-2.5 MCG/ACT Aers Generic drug: Tiotropium Bromide-Olodaterol Inhale 2 puffs into the lungs daily.   tamsulosin 0.4 MG Caps capsule Commonly known as: FLOMAX Take 2 capsules (0.8 mg total) by mouth daily. What changed: when to take this   testosterone cypionate 200 MG/ML injection Commonly known as: DEPOTESTOSTERONE CYPIONATE INJECT 1 ML (200 MG TOTAL) INTO THE MUSCLE EVERY 14 DAYS What changed: See the new instructions.   tiZANidine 4 MG tablet Commonly known as: ZANAFLEX TAKE 1.5 TABLETS (6 MG TOTAL) BY MOUTH EVERY 6 (SIX) HOURS AS NEEDED FOR MUSCLE SPASMS.   venlafaxine XR 150 MG 24 hr capsule Commonly known as: EFFEXOR-XR TAKE 1 CAPSULE BY MOUTH DAILY WITH BREAKFAST. What changed: how to take this   zolpidem 5 MG tablet Commonly known as: AMBIEN Take 1 tablet (5 mg total) by mouth at bedtime as needed. for sleep        Follow-up Information     Saguier, Iris Pert. Schedule an appointment as soon as possible for a visit in 1 week(s).   Specialties: Internal Medicine, Family Medicine Why: For hospital follow-up Contact information: [address] [place] [address] [phone]                Allergies  Allergen Reactions   Daptomycin     Drug induced pneumonia    Diltiazem Other (See Comments)    Passed out    Consultations: None   Procedures/Studies: DG Chest 2 View  Result Date: 07/15/2021 CLINICAL DATA:  Short of breath.  Hypoxemia. EXAM: CHEST - 2 VIEW COMPARISON:  02/09/2021 FINDINGS: Extensive asymmetric airspace disease throughout the left upper lobe and left lower lobe. Small amount of airspace disease in the right  lateral lung base. No pleural effusion. Heart size normal.  Vascularity normal Extensive thoracic and lumbar fusion hardware. IMPRESSION: Extensive asymmetric airspace disease on the left and a small amount of airspace disease in the right. Probable pneumonia Electronically Signed   By: Franchot Gallo M.D.   On: 07/15/2021 18:26   CT Angio Chest PE W and/or Wo Contrast  Result Date: 08/07/2021 CLINICAL DATA:  Known PE on Lovenox, new oxygen requirement, concern for clot progression, additional history of pill aspiration, concern for retained fragment EXAM: CT ANGIOGRAPHY CHEST WITH CONTRAST TECHNIQUE: Multidetector CT imaging of the chest was performed using the standard protocol during bolus administration of intravenous contrast. Multiplanar CT image reconstructions and MIPs were obtained to evaluate the vascular anatomy. RADIATION DOSE REDUCTION: This exam was performed according to the departmental dose-optimization program which includes automated exposure control, adjustment of the mA and/or kV according to patient size and/or use of iterative reconstruction technique. CONTRAST:  46m OMNIPAQUE IOHEXOL 350 MG/ML SOLN COMPARISON:  CT chest angiogram, 07/15/2021 FINDINGS: Cardiovascular: Satisfactory opacification of the pulmonary arteries to the segmental level. No evidence of pulmonary embolism. Normal heart size. Left coronary artery calcifications. No pericardial effusion. Scattered aortic atherosclerosis. Mediastinum/Nodes: No enlarged mediastinal, hilar, or axillary lymph nodes. Thyroid gland, trachea, and esophagus demonstrate no significant findings. Lungs/Pleura: Extensive bilateral heterogeneous and ground-glass airspace disease throughout the lungs is in general improved, particularly in the left lung, however there are new, scattered ground-glass airspace opacities throughout the right lung (series 6, image 101). Mild underlying centrilobular and paraseptal emphysema. No pleural effusion or  pneumothorax. Upper Abdomen: No acute abnormality. Musculoskeletal: No chest wall abnormality. No acute osseous findings. Review of the MIP images confirms the above findings. IMPRESSION: 1. Negative examination for pulmonary embolism. Previously seen segmental to subsegmental embolus in the right lung is resolved. 2. Extensive bilateral heterogeneous and ground-glass airspace disease throughout the lungs is in general improved, particularly in the left lung, however there are new, scattered ground-glass airspace opacities throughout the right lung. Findings are consistent with ongoing infection or inflammation. 3. Emphysema. 4. Coronary artery disease. Aortic Atherosclerosis (ICD10-I70.0) and Emphysema (ICD10-J43.9). Electronically Signed   By: ADelanna AhmadiM.D.   On: 08/07/2021 16:36   CT Angio Chest Pulmonary Embolism (PE) W or WO Contrast  Result Date: 07/16/2021 CLINICAL DATA:  Pulmonary embolism suspected.  High probability. EXAM: CT ANGIOGRAPHY CHEST WITH CONTRAST TECHNIQUE: Multidetector CT imaging of the chest was performed using the standard protocol during bolus administration of intravenous contrast. Multiplanar CT image reconstructions and MIPs were obtained to evaluate the vascular anatomy. CONTRAST:  831mOMNIPAQUE IOHEXOL 350 MG/ML SOLN COMPARISON:  PA and lateral chest today, chest film 02/09/2021, chest CT without contrast 12/11/2020 and CTA chest 11/10/2020. FINDINGS: Cardiovascular: There is mild cardiomegaly with right chamber predominance, RV/LV ratio 1.2 indicating likely right heart strain and prominence of the pulmonary trunk to 3.3 cm.  There are segmental and subsegmental occlusive emboli in the right lower lobe posterior base and in the right upper lobe apical and posterior segments. There may questionably be small-vessel emboli in the right middle lobe periphery but this is difficult to assess due to breathing motion. There is no pericardial effusion. There are scattered  calcifications in the LAD and circumflex coronary arteries thoracic aorta. There is no aortic dissection or aneurysm. The great vessels opacify normally. Mediastinum/Nodes: There are mildly enlarged left-sided pre-vascular lymph nodes compared to the last 2 studies, for example a lymph node measuring 1.1 cm in short axis on series 4 axial 57, prominent subcarinal nodal complex now measuring 1.5 cm in short axis on axial 74, and decreased prominence of right hilar lymph nodes, largest of these 1.4 cm in short axis on axial 67. No thyroid or esophageal mass is seen. Lungs/Pleura: Extensive interstitial and ground-glass consolidation in the left lung is noted with denser patchy consolidation in the upper half of the left upper lobe. Background mild centrilobular and paraseptal emphysematous change in the upper lobes. There are much less dense faint patchy ground-glass opacities in the right lower lobe probably post pneumonic scarring and similar to the last CT, with new areas of denser patchy ground-glass infiltrate in the anterior segment of the right upper lobe. There are increased small cystic spaces in the consolidation primarily within the left upper lobe and superior segment left lower lobe concerning for a necrotizing pneumonic process. No pulmonary abscess is seen. There are only trace pleural effusions and no loculated collection. Relatively small caliber in the central airways may suggest reactive airways disease or bronchospasm and there is mild bronchial thickening in both lower lobes. Upper Abdomen: No acute abnormality. Musculoskeletal: Additional comparison is made to a thoracic spine CT of 03/26/2021. Extensive posterior spinal fusion hardware is again noted, evidence of previously removed pedicle screws at T5, T6 and T10 with posterior rods and pedicle screws again beginning at the level of T7. peridiscal endplate erosions continue to be noted at T6-7, less pronounced than previously with evidence  suggesting early vertebral body ankylosis at this level with chronic spondylodiscitis. Evidence of prior spondylodiscitis and vertebral body ankylosis again noted T4-5 and T9-10. No new endplate erosive disease is seen. Bilateral paravertebral soft tissue prominence along the mid to lower thoracic spine is similar. Previously described as likely phlegmon. There are no paraspinal fluid collections. Review of the MIP images confirms the above findings. IMPRESSION: 1. Segmental and subsegmental arterial emboli in the right upper and lower lobes. The overall clot burden appears small to moderate. There may be additional small vessel emboli in the right middle lobe, difficult to confirm given respiratory motion. 2. Cardiomegaly with a right chamber predominance indicating right heart strain with prominent pulmonary trunk and no further visible emboli. 3. Interstitial and extensive ground-glass and patchy consolidative change in the left lung with scattered ground-glass infiltrates in the anterior segment of the right upper lobe. Background COPD. Probable multi lobar pneumonia, previously seen in different locations with similar appearance. 4. Increased small cystic spaces in the left lung consolidation concerning for necrotizing pneumonic component. Follow-up study recommended after treatment. 5. Increased mildly enlarged mediastinal and right hilar lymph nodes likely reactive. 6. Aortic and coronary artery atherosclerosis. 7. 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 Paraspinal soft tissue fullness appear similar. Electronically Signed   By: Lanny Hurst  Chesser M.D.   On: 07/16/2021 00:16   CT Chest High Resolution  Result Date: 07/18/2021 CLINICAL DATA:  76 year old male with clinical suspicion of interstitial lung disease. EXAM: CT CHEST WITHOUT CONTRAST TECHNIQUE:  Multidetector CT imaging of the chest was performed following the standard protocol without intravenous contrast. High resolution imaging of the lungs, as well as inspiratory and expiratory imaging, was performed. COMPARISON:  Chest CTA 07/15/2021. FINDINGS: Cardiovascular: Heart size is normal. There is no significant pericardial fluid, thickening or pericardial calcification. There is aortic atherosclerosis, as well as atherosclerosis of the great vessels of the mediastinum and the coronary arteries, including calcified atherosclerotic plaque in the left main, left anterior descending and left circumflex coronary arteries. Mediastinum/Nodes: No pathologically enlarged mediastinal or hilar lymph nodes. Please note that accurate exclusion of hilar adenopathy is limited on noncontrast CT scans. Esophagus is unremarkable in appearance. No axillary lymphadenopathy. Lungs/Pleura: Patchy areas of ground-glass attenuation, septal thickening, thickening of the peribronchovascular interstitium and patchy peripheral predominant areas of airspace consolidation are noted in the lungs bilaterally (left-greater-than-right), most compatible with severe multilobar bilateral pneumonia. Near complete sparing of the right middle lobe is noted. Scattered areas of cylindrical bronchiectasis are noted. Inspiratory and expiratory imaging is unremarkable. Trace left pleural effusion. No definite suspicious appearing pulmonary nodules or masses are noted. Diffuse bronchial wall thickening with mild centrilobular and paraseptal emphysema. Upper Abdomen: Aortic atherosclerosis. Musculoskeletal: There are no aggressive appearing lytic or blastic lesions noted in the visualized portions of the skeleton. Posterior rod and screw fixation device in place throughout the thoracolumbar spine. IMPRESSION: 1. The appearance of the lungs is most compatible with severe multilobar bilateral pneumonia, as above. 2. Diffuse bronchial wall thickening with  mild centrilobular and paraseptal emphysema; imaging findings suggestive of underlying COPD. 3. Aortic atherosclerosis, in addition to left main and 2 vessel coronary artery disease. Assessment for potential risk factor modification, dietary therapy or pharmacologic therapy may be warranted, if clinically indicated. Aortic Atherosclerosis (ICD10-I70.0) and Emphysema (ICD10-J43.9). Electronically Signed   By: Vinnie Langton M.D.   On: 07/18/2021 11:38   DG Chest Portable 1 View  Result Date: 08/07/2021 CLINICAL DATA:  76 year old male with shortness of breath and cough. EXAM: PORTABLE CHEST 1 VIEW COMPARISON:  07/22/2021 and prior radiographs FINDINGS: Cardiomediastinal silhouette is unchanged. LEFT lung airspace disease has improved. No pneumothorax or large pleural effusion noted. The RIGHT lung is clear. Thoracolumbar surgical hardware again noted. IMPRESSION: Improved LEFT lung airspace since 07/22/2021. No other new findings. Electronically Signed   By: Margarette Canada M.D.   On: 08/07/2021 15:39   DG Chest Port 1 View  Result Date: 07/22/2021 CLINICAL DATA:  Pneumonitis EXAM: PORTABLE CHEST 1 VIEW COMPARISON:  07/17/2021 FINDINGS: Extensive infiltrate throughout the left lung with interval progression. Minimal left effusion Mild airspace disease on the right has improved. No right effusion. Negative for heart failure Extensive thoracolumbar fusion hardware. IMPRESSION: Extensive infiltrate in the left lung with progression. Mild improvement in right-sided infiltrate. Probable pneumonia. Electronically Signed   By: Franchot Gallo M.D.   On: 07/22/2021 10:27   DG Chest Port 1 View  Result Date: 07/17/2021 CLINICAL DATA:  76 year old male with increasing shortness of breath. EXAM: PORTABLE CHEST 1 VIEW COMPARISON:  Chest x-ray 07/15/2021. FINDINGS: Widespread areas of interstitial prominence and patchy asymmetrically distributed airspace disease is again noted in the lungs bilaterally (left greater than  right). Compared to the prior examination, there is worsening aeration in the right upper lobe. No definite pleural effusions. No  pneumothorax. No evidence of pulmonary edema. Heart size is normal. Upper mediastinal contours are within normal limits. Atherosclerotic calcifications are noted in the thoracic aorta. Orthopedic rod and screw fixation hardware noted throughout the thoracolumbar spine. IMPRESSION: 1. Worsening multilobar bilateral pneumonia, as above. 2. Aortic atherosclerosis. Electronically Signed   By: Vinnie Langton M.D.   On: 07/17/2021 10:15   ECHOCARDIOGRAM COMPLETE  Result Date: 07/16/2021    ECHOCARDIOGRAM REPORT   Patient Name:   KIDUS DELMAN Date of Exam: 07/16/2021 Medical Rec #:  [id number]        Height:       70.0 in Accession #:    [id number]       Weight:       170.0 lb Date of Birth:  Apr 19, 1946         BSA:          1.948 m Patient Age:    51 years         BP:           105/65 mmHg Patient Gender: M                HR:           71 bpm. Exam Location:  Inpatient Procedure: 2D Echo, Cardiac Doppler and Color Doppler Indications:    Pulmonary embolus  History:        Patient has prior history of Echocardiogram examinations. COPD,                 Arrythmias:Atrial Fibrillation; Risk Factors:Hypertension and                 Diabetes.  Sonographer:    Jyl Heinz Referring Phys: [place]  1. Left ventricular ejection fraction, by estimation, is 60 to 65%. The left ventricle has normal function. The left ventricle has no regional wall motion abnormalities. Left ventricular diastolic parameters were normal.  2. Right ventricular systolic function is normal. The right ventricular size is normal. Tricuspid regurgitation signal is inadequate for assessing PA pressure.  3. The mitral valve is grossly normal. Trivial mitral valve regurgitation. No evidence of mitral stenosis.  4. The aortic valve is tricuspid. Aortic valve regurgitation is not visualized. No  aortic stenosis is present.  5. The inferior vena cava is normal in size with greater than 50% respiratory variability, suggesting right atrial pressure of 3 mmHg. Conclusion(s)/Recommendation(s): Normal biventricular function without evidence of hemodynamically significant valvular heart disease. FINDINGS  Left Ventricle: Left ventricular ejection fraction, by estimation, is 60 to 65%. The left ventricle has normal function. The left ventricle has no regional wall motion abnormalities. The left ventricular internal cavity size was normal in size. There is  no left ventricular hypertrophy. Left ventricular diastolic parameters were normal. Right Ventricle: The right ventricular size is normal. No increase in right ventricular wall thickness. Right ventricular systolic function is normal. Tricuspid regurgitation signal is inadequate for assessing PA pressure. Left Atrium: Left atrial size was normal in size. Right Atrium: Right atrial size was normal in size. Pericardium: There is no evidence of pericardial effusion. Mitral Valve: The mitral valve is grossly normal. Trivial mitral valve regurgitation. No evidence of mitral valve stenosis. Tricuspid Valve: The tricuspid valve is grossly normal. Tricuspid valve regurgitation is trivial. No evidence of tricuspid stenosis. Aortic Valve: The aortic valve is tricuspid. Aortic valve regurgitation is not visualized. No aortic stenosis is present. Aortic valve peak gradient measures 12.4 mmHg. Pulmonic Valve: The pulmonic valve  was grossly normal. Pulmonic valve regurgitation is trivial. No evidence of pulmonic stenosis. Aorta: The aortic root and ascending aorta are structurally normal, with no evidence of dilitation. Venous: The inferior vena cava is normal in size with greater than 50% respiratory variability, suggesting right atrial pressure of 3 mmHg. IAS/Shunts: The atrial septum is grossly normal.  LEFT VENTRICLE PLAX 2D LVIDd:         4.80 cm      Diastology LVIDs:          3.00 cm      LV e' medial:    6.96 cm/s LV PW:         1.10 cm      LV E/e' medial:  10.1 LV IVS:        1.10 cm      LV e' lateral:   7.72 cm/s LVOT diam:     2.10 cm      LV E/e' lateral: 9.1 LV SV:         88 LV SV Index:   45 LVOT Area:     3.46 cm  LV Volumes (MOD) LV vol d, MOD A2C: 118.0 ml LV vol d, MOD A4C: 106.0 ml LV vol s, MOD A2C: 44.1 ml LV vol s, MOD A4C: 38.3 ml LV SV MOD A2C:     73.9 ml LV SV MOD A4C:     106.0 ml LV SV MOD BP:      71.2 ml RIGHT VENTRICLE             IVC RV Basal diam:  3.70 cm     IVC diam: 1.20 cm RV Mid diam:    2.90 cm RV S prime:     13.80 cm/s TAPSE (M-mode): 2.6 cm LEFT ATRIUM             Index        RIGHT ATRIUM           Index LA diam:        3.80 cm 1.95 cm/m   RA Area:     15.30 cm LA Vol (A2C):   47.2 ml 24.23 ml/m  RA Volume:   39.90 ml  20.48 ml/m LA Vol (A4C):   46.4 ml 23.82 ml/m LA Biplane Vol: 50.1 ml 25.72 ml/m  AORTIC VALVE AV Area (Vmax): 2.40 cm AV Vmax:        176.00 cm/s AV Peak Grad:   12.4 mmHg LVOT Vmax:      122.00 cm/s LVOT Vmean:     83.500 cm/s LVOT VTI:       0.255 m  AORTA Ao Root diam: 3.10 cm Ao Asc diam:  3.00 cm MITRAL VALVE MV Area (PHT): 3.65 cm    SHUNTS MV Decel Time: 208 msec    Systemic VTI:  0.26 m MV E velocity: 70.60 cm/s  Systemic Diam: 2.10 cm MV A velocity: 85.90 cm/s MV E/A ratio:  0.82 Eleonore Chiquito MD Electronically signed by Eleonore Chiquito MD Signature Date/Time: 07/16/2021/12:45:35 PM    Final    VAS [place] LOWER EXTREMITY VENOUS (DVT)  Result Date: 07/16/2021  Lower Venous DVT Study Patient Name:  KOLBEE BOGUSZ  Date of Exam:   07/16/2021 Medical Rec #: [id number]         Accession #:    [id number] Date of Birth: Jun 10, 1946          Patient Gender: M Patient Age:   57 years Exam Location:  [facility] Procedure:      VAS [place] LOWER EXTREMITY VENOUS (DVT) Referring Phys: Nyoka Lint DOUTOVA --------------------------------------------------------------------------------  Indications: Pulmonary embolism.   Risk Factors: Confirmed PE. Anticoagulation: Heparin. Comparison Study: No prior studies. Performing Technologist: Oliver Hum RVT  Examination Guidelines: A complete evaluation includes B-mode imaging, spectral Doppler, color Doppler, and power Doppler as needed of all accessible portions of each vessel. Bilateral testing is considered an integral part of a complete examination. Limited examinations for reoccurring indications may be performed as noted. The reflux portion of the exam is performed with the patient in reverse Trendelenburg.  +---------+---------------+---------+-----------+----------+--------------+  RIGHT     Compressibility Phasicity Spontaneity Properties Thrombus Aging  +---------+---------------+---------+-----------+----------+--------------+  CFV       Full            Yes       Yes                                    +---------+---------------+---------+-----------+----------+--------------+  SFJ       Full                                                             +---------+---------------+---------+-----------+----------+--------------+  FV Prox   Full                                                             +---------+---------------+---------+-----------+----------+--------------+  FV Mid    Full                                                             +---------+---------------+---------+-----------+----------+--------------+  FV Distal Full                                                             +---------+---------------+---------+-----------+----------+--------------+  PFV       Full                                                             +---------+---------------+---------+-----------+----------+--------------+  POP       Full            Yes       Yes                                    +---------+---------------+---------+-----------+----------+--------------+  PTV       Full                                                              +---------+---------------+---------+-----------+----------+--------------+  PERO      Full                                                             +---------+---------------+---------+-----------+----------+--------------+   +---------+---------------+---------+-----------+----------+--------------+  LEFT      Compressibility Phasicity Spontaneity Properties Thrombus Aging  +---------+---------------+---------+-----------+----------+--------------+  CFV       Full            Yes       Yes                                    +---------+---------------+---------+-----------+----------+--------------+  SFJ       Full                                                             +---------+---------------+---------+-----------+----------+--------------+  FV Prox   Full                                                             +---------+---------------+---------+-----------+----------+--------------+  FV Mid    Full                                                             +---------+---------------+---------+-----------+----------+--------------+  FV Distal Full                                                             +---------+---------------+---------+-----------+----------+--------------+  PFV       Full                                                             +---------+---------------+---------+-----------+----------+--------------+  POP       Full            Yes       Yes                                    +---------+---------------+---------+-----------+----------+--------------+  PTV       Full                                                             +---------+---------------+---------+-----------+----------+--------------+  PERO      Full                                                             +---------+---------------+---------+-----------+----------+--------------+     Summary: RIGHT: - There is no evidence of deep vein thrombosis in the lower extremity.  - No cystic structure found in  the popliteal fossa.  LEFT: - There is no evidence of deep vein thrombosis in the lower extremity.  - No cystic structure found in the popliteal fossa.  *See table(s) above for measurements and observations. Electronically signed by Monica Martinez MD on 07/16/2021 at 12:36:24 PM.    Final    ECHOCARDIOGRAM LIMITED  Result Date: 07/20/2021    ECHOCARDIOGRAM LIMITED REPORT   Patient Name:   DUFFY DANTONIO Date of Exam: 07/20/2021 Medical Rec #:  [id number]        Height:       70.0 in Accession #:    [id number]       Weight:       170.0 lb Date of Birth:  11/08/1945         BSA:          1.948 m Patient Age:    50 years         BP:           142/94 mmHg Patient Gender: M                HR:           102 bpm. Exam Location:  Inpatient Procedure: Limited Echo, Cardiac Doppler and Color Doppler Indications:    Pulmonary Embolus I26.09  History:        Patient has prior history of Echocardiogram examinations, most                 recent 07/16/2021. Arrythmias:Atrial Fibrillation; Risk                 Factors:Hypertension, Diabetes and Dyslipidemia.  Sonographer:    Darlina Sicilian RDCS Referring Phys: Rafael Capo  1. Left ventricular ejection fraction, by estimation, is 60 to 65%. The left ventricle has normal function. The left ventricle has no regional wall motion abnormalities. Left ventricular diastolic parameters are indeterminate.  2. Right ventricular systolic function is normal. The right ventricular size is normal. There is normal pulmonary artery systolic pressure.  3. The mitral valve is normal in structure. No evidence of mitral valve regurgitation. No evidence of mitral stenosis.  4. The aortic valve is tricuspid. Aortic valve regurgitation is not visualized. No aortic stenosis is present.  5. The inferior vena cava is normal in size with greater than 50% respiratory variability, suggesting right atrial pressure of 3 mmHg. Comparison(s): A prior study was performed on 12/30.2022. No  significant change from prior study. FINDINGS  Left Ventricle: Left ventricular ejection fraction, by estimation, is 60 to 65%. The left ventricle has normal function. The left ventricle has no regional wall motion abnormalities. The left ventricular internal cavity size was normal in size. There is  no concentric left ventricular hypertrophy. Left ventricular diastolic parameters are indeterminate. Right Ventricle: The right ventricular size is normal. No increase in right ventricular wall thickness. Right ventricular systolic function is normal. There is normal pulmonary artery  systolic pressure. The tricuspid regurgitant velocity is 2.39 m/s, and  with an assumed right atrial pressure of 3 mmHg, the estimated right ventricular systolic pressure is 33.8 mmHg. Left Atrium: Left atrial size was normal in size. Right Atrium: Right atrial size was normal in size. Pericardium: There is no evidence of pericardial effusion. Presence of epicardial fat layer. Mitral Valve: The mitral valve is normal in structure. Mild mitral annular calcification. No evidence of mitral valve stenosis. Tricuspid Valve: The tricuspid valve is normal in structure. Tricuspid valve regurgitation is trivial. No evidence of tricuspid stenosis. Aortic Valve: The aortic valve is tricuspid. Aortic valve regurgitation is not visualized. No aortic stenosis is present. Pulmonic Valve: The pulmonic valve was normal in structure. Pulmonic valve regurgitation is not visualized. No evidence of pulmonic stenosis. Aorta: The aortic root is normal in size and structure. Venous: The inferior vena cava is normal in size with greater than 50% respiratory variability, suggesting right atrial pressure of 3 mmHg. IAS/Shunts: No atrial level shunt detected by color flow Doppler. LEFT VENTRICLE PLAX 2D LVIDd:         3.90 cm LV PW:         1.03 cm LV IVS:        1.01 cm  TRICUSPID VALVE TR Peak grad:   22.8 mmHg TR Vmax:        239.00 cm/s Kardie Tobb DO  Electronically signed by Berniece Salines DO Signature Date/Time: 07/20/2021/3:19:44 PM    Final       Subjective: Feels good. No dyspnea.  Discharge Exam: Vitals:   08/08/21 0945 08/08/21 0950  BP:    Pulse:    Resp:    Temp:    SpO2: 96% 90%   Vitals:   08/08/21 0603 08/08/21 0930 08/08/21 0945 08/08/21 0950  BP: (!) 127/103 129/68    Pulse: 89 87    Resp: 16 16    Temp: 97.8 F (36.6 C) 98.2 F (36.8 C)    TempSrc: Oral Oral    SpO2: 99% 100% 96% 90%  Weight:      Height:        General: Pt is alert, awake, not in acute distress Cardiovascular: RRR, S1/S2 +, no rubs, no gallops Respiratory: L>R rales. No wheezing. Abdominal: Soft, NT, ND, bowel sounds + Extremities: no edema, no cyanosis    The results of significant diagnostics from this hospitalization (including imaging, microbiology, ancillary and laboratory) are listed below for reference.     Microbiology: Recent Results (from the past 240 hour(s))  Resp Panel by RT-PCR (Flu A&B, Covid) Nasopharyngeal Swab     Status: None   Collection Time: 08/07/21  4:37 PM   Specimen: Nasopharyngeal Swab; Nasopharyngeal(NP) swabs in vial transport medium  Result Value Ref Range Status   SARS Coronavirus 2 by RT PCR NEGATIVE NEGATIVE Final    Comment: (NOTE) SARS-CoV-2 target nucleic acids are NOT DETECTED.  The SARS-CoV-2 RNA is generally detectable in upper respiratory specimens during the acute phase of infection. The lowest concentration of SARS-CoV-2 viral copies this assay can detect is 138 copies/mL. A negative result does not preclude SARS-Cov-2 infection and should not be used as the sole basis for treatment or other patient management decisions. A negative result may occur with  improper specimen collection/handling, submission of specimen other than nasopharyngeal swab, presence of viral mutation(s) within the areas targeted by this assay, and inadequate number of viral copies(<138 copies/mL). A negative  result must be combined with clinical observations, patient  history, and epidemiological information. The expected result is Negative.  Fact Sheet for Patients:  EntrepreneurPulse.com.au  Fact Sheet for Healthcare Providers:  IncredibleEmployment.be  This test is no t yet approved or cleared by the [place] FDA and  has been authorized for detection and/or diagnosis of SARS-CoV-2 by FDA under an Emergency Use Authorization (EUA). This EUA will remain  in effect (meaning this test can be used) for the duration of the COVID-19 declaration under Section 564(b)(1) of the Act, 21 U.S.C.section 360bbb-3(b)(1), unless the authorization is terminated  or revoked sooner.       Influenza A by PCR NEGATIVE NEGATIVE Final   Influenza B by PCR NEGATIVE NEGATIVE Final    Comment: (NOTE) The Xpert Xpress SARS-CoV-2/FLU/RSV plus assay is intended as an aid in the diagnosis of influenza from Nasopharyngeal swab specimens and should not be used as a sole basis for treatment. Nasal washings and aspirates are unacceptable for Xpert Xpress SARS-CoV-2/FLU/RSV testing.  Fact Sheet for Patients: EntrepreneurPulse.com.au  Fact Sheet for Healthcare Providers: IncredibleEmployment.be  This test is not yet approved or cleared by the [place] FDA and has been authorized for detection and/or diagnosis of SARS-CoV-2 by FDA under an Emergency Use Authorization (EUA). This EUA will remain in effect (meaning this test can be used) for the duration of the COVID-19 declaration under Section 564(b)(1) of the Act, 21 U.S.C. section 360bbb-3(b)(1), unless the authorization is terminated or revoked.  Performed at [facility], [place] [address], [place], [place] [address]   Blood Culture (routine x 2)     Status: None (Preliminary result)   Collection Time: 08/07/21  4:37 PM   Specimen: BLOOD  Result Value Ref  Range Status   Specimen Description   Final    BLOOD RIGHT ANTECUBITAL Performed at [facility] [address], [place], [place] [address]    Special Requests   Final    BOTTLES DRAWN AEROBIC AND ANAEROBIC Blood Culture adequate volume Performed at [facility] [address], [place], [place] [address]    Culture   Final    NO GROWTH < 24 HOURS Performed at [facility] [address] [place], [place] [address]    Report Status PENDING  Incomplete  Blood Culture (routine x 2)     Status: None (Preliminary result)   Collection Time: 08/07/21  4:42 PM   Specimen: BLOOD  Result Value Ref Range Status   Specimen Description   Final    BLOOD BLOOD RIGHT ARM Performed at [facility] [address], [place], [place] [address]    Special Requests   Final    BOTTLES DRAWN AEROBIC AND ANAEROBIC Blood Culture results may not be optimal due to an excessive volume of blood received in culture bottles Performed at [facility] [address] [place], [place] [address]    Culture   Final    NO GROWTH < 24 HOURS Performed at [facility] [address], [place], [place] [address]    Report Status PENDING  Incomplete     Labs: BNP (last 3 results) Recent Labs    11/10/20 1654 07/16/21 0222 07/17/21 0508  BNP 68.6 24.9 79.1   Basic Metabolic Panel: Recent Labs  Lab 08/04/21 1046 08/07/21 1511 08/08/21 0154  NA 129* 125* 128*  K 4.8 4.5 4.2  CL 88* 90* 93*  CO2 34* 24 24  GLUCOSE 454* 381* 370*  BUN 20 23  18  CREATININE 0.91 0.75 0.69  CALCIUM 10.1 8.8* 8.4*   Liver Function Tests: Recent Labs  Lab 08/04/21 1046 08/07/21 1511  AST 11* 24  ALT 8 16  ALKPHOS 64 54  BILITOT 0.6 1.1  PROT 6.4* 6.4*  ALBUMIN 4.3 4.0   No results for input(s): LIPASE, AMYLASE in the last 168 hours. No results for input(s): AMMONIA in the last 168 hours. CBC: Recent Labs  Lab  08/04/21 1046 08/07/21 1511 08/08/21 0154  WBC 7.3 11.6* 10.7*  NEUTROABS 6.1 10.6*  --   HGB 13.5 14.1 11.8*  HCT 40.7 41.6 34.9*  MCV 89.8 89.3 89.7  PLT 263 286 266   Cardiac Enzymes: No results for input(s): CKTOTAL, CKMB, CKMBINDEX, TROPONINI in the last 168 hours. BNP: Invalid input(s): POCBNP CBG: Recent Labs  Lab 08/07/21 2050 08/08/21 0723 08/08/21 1138  GLUCAP 280* 270* 165*   D-Dimer No results for input(s): DDIMER in the last 72 hours. Hgb A1c No results for input(s): HGBA1C in the last 72 hours. Lipid Profile No results for input(s): CHOL, HDL, LDLCALC, TRIG, CHOLHDL, LDLDIRECT in the last 72 hours. Thyroid function studies No results for input(s): TSH, T4TOTAL, T3FREE, THYROIDAB in the last 72 hours.  Invalid input(s): FREET3 Anemia work up No results for input(s): VITAMINB12, FOLATE, FERRITIN, TIBC, IRON, RETICCTPCT in the last 72 hours. Urinalysis    Component Value Date/Time   COLORURINE YELLOW 08/07/2021 1637   APPEARANCEUR CLEAR 08/07/2021 1637   LABSPEC 1.018 08/07/2021 1637   PHURINE 6.0 08/07/2021 1637   GLUCOSEU >=500 (A) 08/07/2021 1637   HGBUR NEGATIVE 08/07/2021 1637   BILIRUBINUR NEGATIVE 08/07/2021 1637   BILIRUBINUR negative 10/08/2019 1430   KETONESUR 5 (A) 08/07/2021 1637   PROTEINUR NEGATIVE 08/07/2021 1637   UROBILINOGEN negative (A) 10/08/2019 1430   UROBILINOGEN 0.2 04/08/2011 0956   NITRITE NEGATIVE 08/07/2021 1637   LEUKOCYTESUR NEGATIVE 08/07/2021 1637   Sepsis Labs Invalid input(s): PROCALCITONIN,  WBC,  LACTICIDVEN Microbiology Recent Results (from the past 240 hour(s))  Resp Panel by RT-PCR (Flu A&B, Covid) Nasopharyngeal Swab     Status: None   Collection Time: 08/07/21  4:37 PM   Specimen: Nasopharyngeal Swab; Nasopharyngeal(NP) swabs in vial transport medium  Result Value Ref Range Status   SARS Coronavirus 2 by RT PCR NEGATIVE NEGATIVE Final    Comment: (NOTE) SARS-CoV-2 target nucleic acids are NOT  DETECTED.  The SARS-CoV-2 RNA is generally detectable in upper respiratory specimens during the acute phase of infection. The lowest concentration of SARS-CoV-2 viral copies this assay can detect is 138 copies/mL. A negative result does not preclude SARS-Cov-2 infection and should not be used as the sole basis for treatment or other patient management decisions. A negative result may occur with  improper specimen collection/handling, submission of specimen other than nasopharyngeal swab, presence of viral mutation(s) within the areas targeted by this assay, and inadequate number of viral copies(<138 copies/mL). A negative result must be combined with clinical observations, patient history, and epidemiological information. The expected result is Negative.  Fact Sheet for Patients:  EntrepreneurPulse.com.au  Fact Sheet for Healthcare Providers:  IncredibleEmployment.be  This test is no t yet approved or cleared by the [place] FDA and  has been authorized for detection and/or diagnosis of SARS-CoV-2 by FDA under an Emergency Use Authorization (EUA). This EUA will remain  in effect (meaning this test can be used) for the duration of the COVID-19 declaration under Section 564(b)(1) of the Act, 21 U.S.C.section 360bbb-3(b)(1), unless the authorization  is terminated  or revoked sooner.       Influenza A by PCR NEGATIVE NEGATIVE Final   Influenza B by PCR NEGATIVE NEGATIVE Final    Comment: (NOTE) The Xpert Xpress SARS-CoV-2/FLU/RSV plus assay is intended as an aid in the diagnosis of influenza from Nasopharyngeal swab specimens and should not be used as a sole basis for treatment. Nasal washings and aspirates are unacceptable for Xpert Xpress SARS-CoV-2/FLU/RSV testing.  Fact Sheet for Patients: EntrepreneurPulse.com.au  Fact Sheet for Healthcare Providers: IncredibleEmployment.be  This test is not yet  approved or cleared by the [place] FDA and has been authorized for detection and/or diagnosis of SARS-CoV-2 by FDA under an Emergency Use Authorization (EUA). This EUA will remain in effect (meaning this test can be used) for the duration of the COVID-19 declaration under Section 564(b)(1) of the Act, 21 U.S.C. section 360bbb-3(b)(1), unless the authorization is terminated or revoked.  Performed at [facility], [place] [address] [place], [place] [address]   Blood Culture (routine x 2)     Status: None (Preliminary result)   Collection Time: 08/07/21  4:37 PM   Specimen: BLOOD  Result Value Ref Range Status   Specimen Description   Final    BLOOD RIGHT ANTECUBITAL Performed at [facility] [address] [place], [place] [address]    Special Requests   Final    BOTTLES DRAWN AEROBIC AND ANAEROBIC Blood Culture adequate volume Performed at [facility] [address], [place], [place] [address]    Culture   Final    NO GROWTH < 24 HOURS Performed at [facility] [address] [place], [place] [address]    Report Status PENDING  Incomplete  Blood Culture (routine x 2)     Status: None (Preliminary result)   Collection Time: 08/07/21  4:42 PM   Specimen: BLOOD  Result Value Ref Range Status   Specimen Description   Final    BLOOD BLOOD RIGHT ARM Performed at [facility] [address], [place], [place] [address]    Special Requests   Final    BOTTLES DRAWN AEROBIC AND ANAEROBIC Blood Culture results may not be optimal due to an excessive volume of blood received in culture bottles Performed at [facility] [address], [place], [place] [address]    Culture   Final    NO GROWTH < 24 HOURS Performed at [facility] [address] [place], [place] [address]    Report Status PENDING  Incomplete     SIGNED:   Cordelia Poche, MD [facility] 08/08/2021, 12:24 PM

## 2021-08-08 NOTE — Evaluation (Signed)
Clinical/Bedside Swallow Evaluation Patient Details  Name: Luis Brown MRN: [id number] Date of Birth: 1946/07/11  Today's Date: 08/08/2021 Time: SLP Start Time (ACUTE ONLY): 54 SLP Stop Time (ACUTE ONLY): 8315 SLP Time Calculation (min) (ACUTE ONLY): 15 min  Past Medical History:  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   Esophageal reflux 02/10/2015   Fatigue    HTN (hypertension)    Hyperglycemia    preDM    Hyperlipidemia    Insomnia    Low back pain radiating to both legs 01/16/2017   Measles as a child   Overweight(278.02)    Peripheral neuropathy 08/07/2019   Personal history of colonic polyps 10/27/2012   Follows with [facility]   Pneumonia    " walking"   Preventative health care    Testosterone deficiency 05/23/2012   Wears glasses    Past Surgical History:  Past Surgical History:  Procedure Laterality Date   BACK SURGERY  2012 and 1994   Dr Margret Chance, screws and cage in low back   BIOPSY  01/09/2020   Procedure: BIOPSY;  Surgeon: Otis Brace, MD;  Location: [facility];  Service: Gastroenterology;;   BRONCHIAL BIOPSY  11/17/2020   Procedure: BRONCHIAL BIOPSIES;  Surgeon: Laurin Coder, MD;  Location: [facility];  Service: Endoscopy;;   BRONCHIAL BRUSHINGS  11/17/2020   Procedure: BRONCHIAL BRUSHINGS;  Surgeon: Laurin Coder, MD;  Location: [facility];  Service: Endoscopy;;   BRONCHIAL WASHINGS  11/17/2020   Procedure: BRONCHIAL WASHINGS;  Surgeon: Laurin Coder, MD;  Location: [facility];   Service: Endoscopy;;   COLONOSCOPY WITH PROPOFOL N/A 01/09/2020   Procedure: COLONOSCOPY WITH PROPOFOL;  Surgeon: Otis Brace, MD;  Location: [facility];  Service: Gastroenterology;  Laterality: N/A;   ESOPHAGOGASTRODUODENOSCOPY (EGD) WITH PROPOFOL N/A 01/09/2020   Procedure: ESOPHAGOGASTRODUODENOSCOPY (EGD) WITH PROPOFOL;  Surgeon: Otis Brace, MD;  Location: [facility];  Service: Gastroenterology;  Laterality: N/A;   EYE SURGERY Bilateral    2016   HARDWARE REVISION  03/12/2019   REVISION OF HARDWARE THORACIC TEN-THORACIC ELEVEN WITH APPLICATION OF ADDITIONAL RODS AND ROD SLEEVES THORACIC EIGHT-THORACIC TWELVE 12-LUMBAR ONE (N/A )   HEMOSTASIS CONTROL  11/17/2020   Procedure: HEMOSTASIS CONTROL;  Surgeon: Laurin Coder, MD;  Location: [facility];  Service: Endoscopy;;   IR US GUIDE BX ASP/DRAIN  11/13/2020   LAMINECTOMY  02/12/2019   DECOMPRESSIVE LAMINECTOMY THORACIC NINE-THORACIC TEN AND THORACIC TEN-THORACIC ELEVEN, EXTENSION OF THORACOLUMBAR FUSION FROM THORACIC TEN TO THORACIC FIVE, LOCAL BONE GRAFT, ALLOGRAFT AND VIVIGEN (N/A)   POLYPECTOMY  01/09/2020   Procedure: POLYPECTOMY;  Surgeon: Otis Brace, MD;  Location: [facility];  Service: Gastroenterology;;   REMOVAL OF RODS AND PEDICLE SCREWS T5 AND T6, EXPLORATION OF FUSION, BIOPSY TRANSPEDICULAR T5 AND T6 (N/A )  09/14/2020   SPINAL FUSION N/A 02/12/2019   Procedure: DECOMPRESSIVE LAMINECTOMY THORACIC NINE-THORACIC TEN AND THORACIC TEN-THORACIC ELEVEN, EXTENSION OF THORACOLUMBAR FUSION FROM THORACIC TEN TO THORACIC FIVE, LOCAL BONE GRAFT, ALLOGRAFT AND VIVIGEN;  Surgeon: Jessy Oto, MD;  Location: [place];  Service: Orthopedics;  Laterality: N/A;  DECOMPRESSIVE LAMINECTOMY THORACIC NINE-THORACIC TEN AND THORACIC TEN-THORACIC ELEVEN, EXTENSION OF TH   SPINAL FUSION N/A 03/12/2019   Procedure: REVISION OF HARDWARE THORACIC TEN-THORACIC ELEVEN WITH APPLICATION OF ADDITIONAL RODS AND ROD SLEEVES THORACIC  EIGHT-THORACIC TWELVE 12-LUMBAR ONE;  Surgeon: Jessy Oto, MD;  Location: [place];  Service: Orthopedics;  Laterality: N/A;   TONSILLECTOMY     as child   torn rotator cuff  2010   right   VIDEO BRONCHOSCOPY N/A 11/17/2020   Procedure: VIDEO BRONCHOSCOPY WITH FLUORO;  Surgeon: Laurin Coder, MD;  Location: [facility];  Service: Endoscopy;  Laterality: N/A;   HPI:  Luis Brown is a 76 y.o. male with medical history significant of COPD, diabetes mellitus type 2, hyperlipidemia, depression, recent pulmonary embolism on Lovenox, chronic back pain. Patient presented secondary to trouble breathing after aspirating a vitamin this afternoon. He reports significant dyspnea after the event. EMS was called for help and patient was eventually transported to the ED for evaluation. He does report some worsening dyspnea on exertion over the past few days.   Most recent CTA of the chest was showing extensive bilateral heterogenous and ground glass airspace disease throughout the lungs is in general improved particularly in the left lung with new scattered ground glas aispace opacities throughout the right lung.    Assessment / Plan / Recommendation  Clinical Impression  Clinical swallowing evaluation was completed using thin liquids via cup and straw, pureed material and dry solids in setting for admission following aspiration event while taking a vitamin.  The patient reported no issues swallowing prior to the aspiration event.  Cranial nerve exam was completed and unremarkable.  Lingual, labial, facial and jaw range of motion and strength were adequate.  Facial sensation appeared to be intact and he did not endorse a difference in sensation from the right to left side of his face.  His oral and pharyngeal swallow appeared to be functional.  Mastication of dry solids was adequate with good oral clearance of material.  Swallow trigger was appreciated to palpation and overt s/s of aspiration were not seen.   Patient also passed the [facility] Protocol suggesting the low likelihood for aspiration.  Suggest he remain on a regular textured diet with full range of liquids.  ST follow up is not indicated.  If we can be of further assistance please feel free to reconsult. SLP Visit Diagnosis: Dysphagia, unspecified (R13.10)    Aspiration Risk  No limitations    Diet Recommendation   Regular/thin  Medication Administration: Whole meds with liquid    Other  Recommendations Oral Care Recommendations: Oral care BID    Recommendations for follow up therapy are one component of a multi-disciplinary discharge planning process, led by the attending physician.  Recommendations may be updated based on patient status, additional functional criteria and insurance authorization.  Follow up Recommendations No SLP follow up      Assistance Recommended at Discharge None  Functional Status Assessment Patient has not had a recent decline in their functional status    Swallow Study   General Date of Onset: 08/07/21 HPI: Luis Brown is a 76 y.o. male with medical history significant of COPD, diabetes mellitus type 2, hyperlipidemia, depression, recent pulmonary embolism on Lovenox, chronic back pain. Patient presented secondary to trouble breathing after aspirating a vitamin this afternoon. He reports significant dyspnea after the event. EMS was called  for help and patient was eventually transported to the ED for evaluation. He does report some worsening dyspnea on exertion over the past few days.   Most recent CTA of the chest was showing extensive bilateral heterogenous and ground glass airspace disease throughout the lungs is in general improved particularly in the left lung with new scattered ground glas aispace opacities throughout the right lung. Type of Study: Bedside Swallow Evaluation Previous Swallow Assessment: None noted at [facility] Diet Prior to this Study: Regular;Thin liquids Temperature Spikes Noted:  No Respiratory Status: Nasal cannula History of Recent Intubation: No Behavior/Cognition: Alert;Cooperative;Pleasant mood Oral Cavity Assessment: Within Functional Limits Oral Care Completed by SLP: No Oral Cavity - Dentition: Missing dentition Vision: Functional for self-feeding Self-Feeding Abilities: Able to feed self Patient Positioning: Upright in bed Baseline Vocal Quality: Normal Volitional Swallow: Able to elicit    Oral/Motor/Sensory Function Overall Oral Motor/Sensory Function: Within functional limits   Ice Chips Ice chips: Not tested   Thin Liquid Thin Liquid: Within functional limits Presentation: Cup;Self Fed;Spoon;Straw    Nectar Thick Nectar Thick Liquid: Not tested   Honey Thick Honey Thick Liquid: Not tested   Puree Puree: Within functional limits Presentation: Self Fed;Spoon   Solid     Solid: Within functional limits Presentation: [place], [place], [place] [facility] SLP [phone]  Lamar Sprinkles 08/08/2021,12:57 PM

## 2021-08-08 NOTE — Discharge Instructions (Addendum)
Luis Brown,  You were in the hospital because of aspiration with resultant hypoxia (low oxygen) and lung inflammation. You do not appear to have an infection at this time. I am recommending insulin while you are on the prednisone. Please follow-up with your primary care physician.

## 2021-08-08 NOTE — Progress Notes (Signed)
Inpatient Diabetes Program Recommendations  AACE/ADA: New Consensus Statement on Inpatient Glycemic Control (2015)  Target Ranges:  Prepandial:   less than 140 mg/dL      Peak postprandial:   less than 180 mg/dL (1-2 hours)      Critically ill patients:  140 - 180 mg/dL   Lab Results  Component Value Date   GLUCAP 165 (H) 08/08/2021   HGBA1C 7.4 (H) 07/15/2021    Review of Glycemic Control  Diabetes history: DM2 Outpatient Diabetes medications: metformin 1000 mg BID, Januvia 50 mg QD Current orders for Inpatient glycemic control: Novolog 0-15 units TID with meals  HgbA1C - 7.4% CBGs 270, 165  Inpatient Diabetes Program Recommendations:    Per MD, pt to be discharged on Lantus while on steroids.  Educated patient and spouse on insulin pen use at home. Reviewed all steps if insulin pen including attachment of needle, 2-unit air shot, dialing up dose, giving injection, removing needle, disposal of sharps, storage of unused insulin, disposal of insulin etc. Patient able to provide successful return demonstration. Also reviewed troubleshooting with insulin pen. MD to give patient Rxs for insulin pens and insulin pen needles.   Thank you. Lorenda Peck, RD, LDN, CDE Inpatient Diabetes Coordinator [phone]

## 2021-08-09 LAB — URINE CULTURE: Culture: NO GROWTH

## 2021-08-09 LAB — PROTEIN C, TOTAL: Protein C, Total: 116 % (ref 60–150)

## 2021-08-09 LAB — HOMOCYSTEINE: Homocysteine: 9.8 umol/L (ref 0.0–19.2)

## 2021-08-09 MED ORDER — ZOLPIDEM TARTRATE 5 MG PO TABS: 5.0000 mg | ORAL_TABLET | Freq: Every evening | ORAL | 0 refills | Status: DC | PRN

## 2021-08-09 NOTE — Telephone Encounter (Signed)
Pt wants refill on [place]

## 2021-08-10 ENCOUNTER — Telehealth: Payer: Self-pay | Admitting: *Deleted

## 2021-08-10 NOTE — Telephone Encounter (Signed)
Transition Care Management Follow-up Telephone Call Date of discharge and from where: 07/24/2021  [facility] How have you been since you were released from the hospital? "Better" Any questions or concerns? No  Items Reviewed: Did the pt receive and understand the discharge instructions provided? Yes  Medications obtained and verified? Yes  Other? No  Any new allergies since your discharge? No  Dietary orders reviewed? Yes Do you have support at home? Yes   spouse  Home Care and Equipment/Supplies: Were home health services ordered? no If so, what is the name of the agency? N/a  Has the agency set up a time to come to the patient's home? not applicable Were any new equipment or medical supplies orderd?  No What is the name of the medical supply agency? N/a Were you able to get the supplies/equipment? not applicable Do you have any questions related to the use of the equipment or supplies? No  Functional Questionnaire: (I = Independent and D = Dependent) ADLs: I  Bathing/Dressing- I  Meal Prep- I  Eating- I  Maintaining continence- I  Transferring/Ambulation- I  Managing Meds- I  Follow up appointments reviewed:  PCP Hospital f/u appt confirmed? Yes  Saw PCP on 08/02/21 Specialist Hospital f/u appt confirmed? Yes  Scheduled to see [facility] on 08/12/21 Are transportation arrangements needed? No  If their condition worsens, is the pt aware to call PCP or go to the Emergency Dept.? Yes Was the patient provided with contact information for the PCP's office or ED? Yes Was to pt encouraged to call back with questions or concerns? Yes   Jacqlyn Larsen [facility], BSN RN Case Manager [phone]

## 2021-08-11 LAB — FACTOR 5 LEIDEN

## 2021-08-12 ENCOUNTER — Ambulatory Visit: Payer: Medicare Other | Admitting: Internal Medicine

## 2021-08-12 ENCOUNTER — Other Ambulatory Visit: Payer: Self-pay

## 2021-08-12 ENCOUNTER — Encounter: Payer: Self-pay | Admitting: Internal Medicine

## 2021-08-12 VITALS — BP 116/62 | HR 85 | Temp 97.7°F | Ht 70.0 in | Wt 165.0 lb

## 2021-08-12 DIAGNOSIS — J9621 Acute and chronic respiratory failure with hypoxia: Secondary | ICD-10-CM

## 2021-08-12 DIAGNOSIS — Z86711 Personal history of pulmonary embolism: Secondary | ICD-10-CM

## 2021-08-12 DIAGNOSIS — J849 Interstitial pulmonary disease, unspecified: Secondary | ICD-10-CM

## 2021-08-12 DIAGNOSIS — M4635 Infection of intervertebral disc (pyogenic), thoracolumbar region: Secondary | ICD-10-CM | POA: Diagnosis not present

## 2021-08-12 DIAGNOSIS — M4325 Fusion of spine, thoracolumbar region: Secondary | ICD-10-CM | POA: Diagnosis not present

## 2021-08-12 DIAGNOSIS — G062 Extradural and subdural abscess, unspecified: Secondary | ICD-10-CM | POA: Diagnosis not present

## 2021-08-12 LAB — CULTURE, BLOOD (ROUTINE X 2)
Culture: NO GROWTH
Culture: NO GROWTH
Special Requests: ADEQUATE

## 2021-08-12 NOTE — Progress Notes (Signed)
Referring provider: Elise Benne  HPI: 76 year old male, former smoker (20-pack-year history).  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   Previous [facility] pulmonary encounters:  12/29/20- Dr. Chase Caller Chief Complaint  Patient presents with   Follow-up    PFT  performed 5/24.  Pt states since last visit, he has been doing okay. States he has lost some weight that he has noticed since last visit.    Follow-up drug-induced pneumonitis daptomycin from early May 2022  HPI Luis Brown 76 y.o. -returns for follow-up.  I personally saw him in the hospital on 11/20/2020.  At that point in time he had been started on Solu-Medrol on 11/18/2020.  When I saw him he was on day 3 of Solu-Medrol and he was needing 3 L nasal cannula oxygen at rest.  At that point in time even with 3 L he was desaturating to 86% just talking.  Put him on prednisone 60 mg/day.  After that he see nurse practitioner x2 at least 1 of which was a telephone visit..  As part of his follow-up on 12/11/2020 he had a CT scan of the chest [3 days after pulmonary function test] and on my personal visualization it showed new onset pneumothorax but significant improvement in infiltrates and inflammation.  He had another follow-up chest x-ray 12/17/2020 in which no pneumothorax reported [similar on 12/15/2020] and pulmonary infiltrates appear improved.  He is now here with his wife  He tells me he is now on 10 mg/day prednisone he just darted that today.  He is 1 week left and after that the prednisone taper ends.  Because of the pneumothorax is physical therapy has been on hold but 4 days ago he was evaluated and apparently his pulse ox was 100% but this was on oxygen.  He continues to use his oxygen  although he is not sure if he is desaturating anymore.  He does feel overall improved compared to 6 weeks ago but still has significant amount of fatigue.  He has significant insomnia which he believes is due to prednisone.  Today's last night was the first night he could sleep when he dropped his prednisone down to 10 mg/day.  He believes his fatigue is multifactorial related to the disease, multiple medications, lack of oxygen, insomnia etc.  02/09/2021 Patient presents today for 1 month follow-up. Since his last visit in June, he has established with cardiology and was dx with afib. He is currently on Xarelto and Digoxin. His wife states that he has had more energy recently. He is scheduled for testosterone injection tomorrow. He gets winded with stairs and heavy exertion. He reports intermittent wheezing. He needs refill of albuterol rescue inhaler. He has weaned himself of oxygen and O2 equipment has been picked up. He has been sleeping better at night. Anxiety has improved, he stopped xanax. Due for follow-up CXR today. 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  02/23/2021 Patient presents today for 2 week follow-up. During his last visit he was given trial Spiriva Respimat d/t dyspnea /wheezing symptoms and moderate obstruction seen on his PFTs. He has been using sample of Spiriva Respimat 2.88mcg as prescribed, he occasionally misses a dose for 1-2 days. He states that his wheezing has stopped but he has not noticed a significant difference in dyspnea symptoms. His  ILD scale appears improved from two weeks go. He has having some muscle spasms in his back that started before being placed on LABA.   04/26/2021 -   Chief Complaint  Patient presents with   Follow-up    F/u, trelegy medication concerns    Luis Brown 76 y.o. -returns for follow-up.  Since I last saw him he seen nurse practitioner 2 times.  Symptom scores for dyspnea  continue to improve.  He was also walked for oxygen desaturation February 04, 2019 did not desaturate.  He says he continues to get better.  He is off prednisone.  Nurse practitioner noticed obstructive physiology on the pulmonary function test.  Put him on inhaler.  Currently is on Trelegy.  He says it helps him tremendously.  He feels it opens up his lungs.  He declined his flu shot today because he will have it after the COVID-vaccine in October or November 2022.   06/24/2021- Interim hx  Patient presents today for acute OV/cough. Patient reports increased shortness of breath since 06/13/21 with associated fatigue, cough and chest tightness. Cough is productive with clear mucus. Symptoms started after Thanksgiving. His wife had the flu at the same time. His cough initially improved while taking mucinex, he stopped since he was starting to feel better. Denies f/c/s, purulent mucus, N/V/D. CAT score 17.   Inpatient consult note 07/17/2022 by Dr. Chase Caller at [facility] This is a 76 year old male with poorly controlled diabetes, hemoglobin A1c 7.4, chronic opioid use for back pain, BPH on Flomax, anxiety and depression, paroxysmal A. fib on metoprolol digoxin and Xarelto .  He had long hx of back infection and was on daptomycin.  Then in  spring 2022 was admitted at [facility] with R pneumonitis considered to be daptomycin related pneumonitis.  Was treated with steroids.  Course complicated by right upper extremity DVT secondary to PICC line for which she was on Xarelto.  He is requiring oxygen at that time.  By the summer 2022 he was off oxygen and able to ambulate even without oxygen.  Course was also complicated later in May 2022 with spontaneous pneumothorax.  By the summer 2022 he was able to come off prednisone.  He also had diagnosed of atrial fibrillation for which he was on digoxin and Xarelto was continued [although originally started for DVT].  Last seen in clinic October 2022 at  which time he was off oxygen and off steroids.  He was on Trelegy inhaler for pulmonary function test that back in May 2022 showed obstructive physiology.  He says he was feeling better.  He is only minimally symptomatic.  Plan at that time was to get a repeat high-resolution CT chest done in the spring 2023.   On Thanksgiving 2022 his wife had the flu and he was exposed to this and after that he started having increasing cough and fatigue.  Up until this point he was able to do walking and fishing.  He saw  pulm nurse practitioner 06/24/2021 in the pulmonary clinic.  Was given 8-day prednisone taper for presumed diagnosis of COPD exacerbation.  No antibiotic.  He did see infectious diseases at the same time and for his back infections it was noted, "10 days of linezolid + 8 weeks of tedizolid for his spinal infection and has now been started on dalbavancin infusions every 2 weeks last month as a suppressive regim" (none of which appear to be associated with drug-induced ILD]  He then called into the pulmonary office on 07/13/2021 felt he was desaturating mildly and coughing up green mucus and fevers..  2 COVID home test were negative.  He had a fever of 100.7.  He wanted some prednisone.  We gave him some Omnicef as well and a 5-day prednisone taper but also advised that to report to the ER.  He then presented on 07/15/2021 for admission with acute hypoxic respiratory failure 58% and a history of 1 week of cough and shortness of breath getting worse.  Appears he might not have taken Omnicef.  He had lactic acidosis 2.3 at admission, white count of nearly 15,000 with lymphopenia.  CT angiogram showed acute PE segmental and subsegmental right-sided [despite being on Xarelto] started on IV heparin started on Solu-Medrol and antibiotics . CT also showed L >>> R (opposted from may 2022) GGO. and .  His Doppler ultrasound lower extremities negative for DVT.   On 07/16/2021 he was stable on 4 L oxygen.  His  flu, multiplex PCR and MRSA PCR were negative.  Procalcitonin 0.21.  Echo is normal with normal RV.  Then on 12/3 1/22 seen progressively more hypoxemic now requiring 8 L of oxygen.  Briefly required even more but looking comfortable.  When assisted to go to the bathroom even on 8 L he desaturated to 70s he has been afebrile since admission. Trop improved from 200  prior day -> 70, lactic 1.9 with normal BNP. CXR with ?> Worsening LEft sided air space disease and ccm consulted      OV 08/12/2021  Subjective:  [name], male , DOB: Feb 21, 1946 , age 5 y.o. , MRN: [id number] , ADDRESS: [place] [id number] PCP Mackie Pai, PA-C Patient Care Team: Saguier, Iris Pert as PCP - General (Internal Medicine) Berniece Salines, DO as PCP - Cardiology (Cardiology) Keene Breath., MD (Ophthalmology) Jessy Oto, MD as Consulting Physician (Orthopedic Surgery)  This Provider for this visit: Treatment Team:  Attending Provider: Brand Males, MD    08/12/2021 -   Chief Complaint  Patient presents with   Hospitalization Follow-up    Breathing has improved some but not back to baseline yet. He has some chest congestion and occ cough with clear to yellow sputum.    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  He also has paroxysmal A. fib, low testosterone, chronic hyponatremia, chronic opioid intake.,  Chronic Lyrica  HPI ARTEMIO DOBIE 76 y.o. -returns for office follow-up.  After last saw him in the office I did actually see him New Year's Eve in the hospital with acute on chronic hypoxemic respiratory failure at this admission he had a new diagnosis of pulmonary embolism that was deemed Xarelto failure [he was on Xarelto for DVT in his upper extremity from spring 2022.  Today I learned that he is actually taking testosterone for some years.  When he had the DVT in the spring 2022 after the discharge he was on  testosterone.  He says he took testosterone right up to the admission.  He is now getting subcutaneous Lovenox injections.  He wants to go to Coumadin because has been deemed as Xarelto failure.  Also prior to that admission he did have some infectious symptoms with colored sputum.  At the time of that admission he denies any aspiration.  He was discharged home on oxygen.  He was discharged on a prednisone taper.  Also discharged on oxygen [of note  he was on 10 L oxygen during the hospital stay]  At home he continued on oxygen was doing the prednisone taper but got readmitted 08/07/2021 for 1 day and discharged the following day 08/08/2021 following aspiration of a pill.  He says he has never had any aspiration.  He also and his wife also states that speech eval for swallow in the hospital was normal.  He denies any other choking episodes for pills or for food.  He was treated with antibiotics.  This time at discharge he was only on 2 L nasal cannula.  He was given another prednisone taper.  He tells me that on 14 August 2021 he will go to 30 mg/day for 10 days and then 20 mg daily for 10 days and then 10 mg daily for 10 days and stop   Walking desaturation test today on room air: At room air at rest his pulse ox was 95% for at least 20 minutes.  Then we decided to walk him 185 feet x 3 laps on room air:.  He only walked 1 lap and his pulse ox went down to 86% on room air and stopped.  His heart rate went up to 122/min.  His most recent chest x-ray was 5 days ago on 08/07/2021 at the time of the hospitalization second time for aspirating pill: This is improved compared to the admission 1.   His current symptom score is listed below.   SYMPTOM SCALE - ILD 08/12/2021  Current weight   O2 use 2L [place] but at rest he was 95%  Shortness of Breath 0 -> 5 scale with 5 being worst (score 6 If unable to do)  At rest 1  Simple tasks - showers, clothes change, eating, shaving 3  Household (dishes, doing bed,  laundry) 3  Shopping xno  Walking level at own pace 2  Walking up Stairs 3  Total (30-36) Dyspnea Score 12  How bad is your cough? Not often  How bad is your fatigue A lot  How bad is nausea 0  How bad is vomiting?  0  How bad is diarrhea? 0  How bad is anxiety? some  How bad is depression A little  Any chronic pain - if so where and how bad x   Simple office walk 185 feet x  3 laps goal with forehead probe 08/12/2021    O2 used ra   Number laps completed 1   Comments about pace steady   Resting Pulse Ox/HR 95% and 89/min   Final Pulse Ox/HR 86% and 122/min   Desaturated </= 88% no   Desaturated <= 3% points yes   Got Tachycardic >/= 90/min yes   Symptoms at end of test dyspnea   Miscellaneous comments x        No results found.    PFT  PFT Results Latest Ref Rng & Units 12/08/2020  FVC-Pre L 2.97  FVC-Predicted Pre % 71  FVC-Post L 2.96  FVC-Predicted Post % 71  Pre FEV1/FVC % % 66  Post FEV1/FCV % % 70  FEV1-Pre L 1.97  FEV1-Predicted Pre % 65  FEV1-Post L 2.07  DLCO uncorrected ml/min/mmHg 18.01  DLCO UNC% % 72  DLCO corrected ml/min/mmHg 20.52  DLCO COR %Predicted % 82  DLVA Predicted % 120  TLC L 5.04  TLC % Predicted % 71  RV % Predicted % 95       has a past medical history of Acute pharyngitis (10/21/2013), Allergy, Anxiety and depression (  10/25/2011), BPH (benign prostatic hyperplasia) (04/23/2012), Chicken pox (as a child), DDD (degenerative disc disease), DDD (degenerative disc disease), lumbosacral, Diabetes mellitus, Dyspnea, ED (erectile dysfunction) (04/23/2012), Elevated BP, 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   reports that he quit smoking about 32 years ago. His smoking use included cigarettes. He has a 20.00 pack-year smoking history. He has never used smokeless tobacco.  Past Surgical History:  Procedure Laterality Date   BACK SURGERY  2012 and 1994   Dr Margret Chance, screws and cage in low back   BIOPSY  01/09/2020   Procedure: BIOPSY;  Surgeon: Otis Brace, MD;  Location: [facility];  Service: Gastroenterology;;   BRONCHIAL BIOPSY  11/17/2020   Procedure: BRONCHIAL BIOPSIES;  Surgeon: Laurin Coder, MD;  Location: [facility];  Service: Endoscopy;;   BRONCHIAL BRUSHINGS  11/17/2020   Procedure: BRONCHIAL BRUSHINGS;  Surgeon: Laurin Coder, MD;  Location: [facility];  Service: Endoscopy;;   BRONCHIAL WASHINGS  11/17/2020   Procedure: BRONCHIAL WASHINGS;  Surgeon: Laurin Coder, MD;  Location: [facility];  Service: Endoscopy;;   COLONOSCOPY WITH PROPOFOL N/A 01/09/2020   Procedure: COLONOSCOPY WITH PROPOFOL;  Surgeon: Otis Brace, MD;  Location: [facility];  Service: Gastroenterology;  Laterality: N/A;   ESOPHAGOGASTRODUODENOSCOPY (EGD) WITH PROPOFOL N/A 01/09/2020   Procedure: ESOPHAGOGASTRODUODENOSCOPY (EGD) WITH PROPOFOL;  Surgeon: Otis Brace, MD;  Location: [facility];  Service: Gastroenterology;  Laterality: N/A;   EYE SURGERY Bilateral    2016   HARDWARE REVISION  03/12/2019   REVISION OF HARDWARE THORACIC TEN-THORACIC ELEVEN WITH APPLICATION OF ADDITIONAL RODS AND ROD SLEEVES THORACIC EIGHT-THORACIC TWELVE 12-LUMBAR ONE (N/A )   HEMOSTASIS CONTROL  11/17/2020   Procedure: HEMOSTASIS CONTROL;  Surgeon: Laurin Coder, MD;  [facility]: [facility];  Service: Endoscopy;;   IR US GUIDE BX ASP/DRAIN  11/13/2020   LAMINECTOMY  02/12/2019   DECOMPRESSIVE LAMINECTOMY THORACIC NINE-THORACIC TEN AND THORACIC TEN-THORACIC ELEVEN, EXTENSION OF THORACOLUMBAR FUSION FROM THORACIC TEN TO THORACIC FIVE, LOCAL BONE GRAFT, ALLOGRAFT AND VIVIGEN (N/A)    POLYPECTOMY  01/09/2020   Procedure: POLYPECTOMY;  Surgeon: Otis Brace, MD;  [facility]: [facility];  Service: Gastroenterology;;   REMOVAL OF RODS AND PEDICLE SCREWS T5 AND T6, EXPLORATION OF FUSION, BIOPSY TRANSPEDICULAR T5 AND T6 (N/A )  09/14/2020   SPINAL FUSION N/A 02/12/2019   Procedure: DECOMPRESSIVE LAMINECTOMY THORACIC NINE-THORACIC TEN AND THORACIC TEN-THORACIC ELEVEN, EXTENSION OF THORACOLUMBAR FUSION FROM THORACIC TEN TO THORACIC FIVE, LOCAL BONE GRAFT, ALLOGRAFT AND VIVIGEN;  Surgeon: Jessy Oto, MD;  Location: [facility];  Service: Orthopedics;  Laterality: N/A;  DECOMPRESSIVE LAMINECTOMY THORACIC NINE-THORACIC TEN AND THORACIC TEN-THORACIC ELEVEN, EXTENSION OF TH   SPINAL FUSION N/A 03/12/2019   Procedure: REVISION OF HARDWARE THORACIC TEN-THORACIC ELEVEN WITH APPLICATION OF ADDITIONAL RODS AND ROD SLEEVES THORACIC EIGHT-THORACIC TWELVE 12-LUMBAR ONE;  Surgeon: Jessy Oto, MD;  Location: [facility];  Service: Orthopedics;  Laterality: N/A;   TONSILLECTOMY     as child   torn rotator cuff  2010   right   VIDEO BRONCHOSCOPY N/A 11/17/2020   Procedure: VIDEO BRONCHOSCOPY WITH FLUORO;  Surgeon: Laurin Coder, MD;  [facility];  Service: Endoscopy;  Laterality: N/A;    Allergies  Allergen Reactions   Daptomycin     Drug induced  pneumonia    Diltiazem Other (See Comments)    Passed out    Immunization History  Administered Date(s) Administered   Fluad Quad(high Dose 65+) 04/05/2019, 06/29/2020, 05/18/2021   Influenza Split 04/23/2012   Influenza Whole 02/16/2011   Influenza, High Dose Seasonal PF 05/06/2013, 03/25/2016, 05/05/2017, 06/07/2018   PFIZER(Purple Top)SARS-COV-2 Vaccination 08/23/2019, 09/13/2019, 06/07/2020   Pfizer Covid-19 Vaccine Bivalent Booster 5yrs & up 04/29/2021   Pneumococcal Conjugate-13 03/25/2016   Pneumococcal Polysaccharide-23 01/20/2009, 10/19/2011   Td 07/09/2003, 07/18/2008   Tdap 07/20/2011   Zoster, Live 04/27/2016     Family History  Problem Relation Age of Onset   Hypertension Mother    Diabetes Mother        type 2   Cancer Mother 69       breast in remission   Emphysema Father        smoker   COPD Father        smoker   Stroke Father 2       mini   Heart disease Father    Hypertension Sister    Hyperlipidemia Sister    Scoliosis Sister    Osteoporosis Sister    Hypertension Maternal Grandmother    Scoliosis Maternal Grandmother    Heart disease Maternal Grandfather    Heart disease Paternal Grandfather        smoker   Heart disease Daughter      Current Outpatient Medications:    albuterol (PROVENTIL HFA) 108 (90 Base) MCG/ACT inhaler, TAKE 2 PUFFS BY MOUTH EVERY 6 HOURS AS NEEDED FOR WHEEZE OR SHORTNESS OF BREATH, Disp: 8 g, Rfl: 5   atorvastatin (LIPITOR) 10 MG tablet, TAKE 1 TABLET BY MOUTH EVERY DAY (Patient taking differently: Take 10 mg by mouth daily.), Disp: 30 tablet, Rfl: 0   DALVANCE 500 MG SOLR, Inject 500 mg into the vein every 14 (fourteen) days. Pt states this is a every 2 week antibiotic. Could not state how long therapy should be., Disp: , Rfl:    digoxin (LANOXIN) 0.125 MG tablet, Take 1 tablet (0.125 mg total) by mouth daily., Disp: 90 tablet, Rfl: 3   enoxaparin (LOVENOX) 80 MG/0.8ML injection, Inject 0.8 mLs (80 mg total) into the skin every 12 (twelve) hours., Disp: 80 mL, Rfl: 1   glucose blood (CONTOUR NEXT TEST) test strip, Check blood sugar once daily, Disp: 100 each, Rfl: 12   insulin glargine (LANTUS) 100 UNIT/ML Solostar Pen, Inject 10 Units into the skin daily., Disp: 15 mL, Rfl: 0   Insulin Pen Needle (PEN NEEDLES 3/16") 31G X 5 MM MISC, Use with Lantus, Disp: 100 each, Rfl: 0   metFORMIN (GLUCOPHAGE) 1000 MG tablet, TAKE 1 TABLET (1,000 MG TOTAL) BY MOUTH 2 (TWO) TIMES DAILY WITH A MEAL., Disp: 180 tablet, Rfl: 0   oxyCODONE-acetaminophen (PERCOCET/ROXICET) 5-325 MG tablet, Take 1 tablet by mouth every 4 (four) hours as needed for moderate pain., Disp:  , Rfl:    pantoprazole (PROTONIX) 40 MG tablet, TAKE 1 TABLET BY MOUTH TWICE A DAY BEFORE A MEAL (Patient taking differently: 40 mg 2 (two) times daily before a meal.), Disp: 180 tablet, Rfl: 1   predniSONE (DELTASONE) 10 MG tablet, Take 4 tablets (40 mg total) by mouth daily with breakfast for 10 days, THEN 3 tablets (30 mg total) daily with breakfast for 10 days, THEN 2 tablets (20 mg total) daily with breakfast for 10 days, THEN 1 tablet (10 mg total) daily with breakfast for 10 days., Disp: 100 tablet,  Rfl: 0   pregabalin (LYRICA) 75 MG capsule, TAKE 1 CAPSULE BY MOUTH TWICE A DAY (Patient taking differently: Take 75 mg by mouth 2 (two) times daily.), Disp: 60 capsule, Rfl: 3   sitaGLIPtin (JANUVIA) 50 MG tablet, Take 1 tablet (50 mg total) by mouth daily., Disp: 30 tablet, Rfl: 3   STIOLTO RESPIMAT 2.5-2.5 MCG/ACT AERS, Inhale 2 puffs into the lungs daily., Disp: , Rfl:    tamsulosin (FLOMAX) 0.4 MG CAPS capsule, Take 2 capsules (0.8 mg total) by mouth daily. (Patient taking differently: Take 0.8 mg by mouth at bedtime.), Disp: 180 capsule, Rfl: 3   tiZANidine (ZANAFLEX) 4 MG tablet, TAKE 1.5 TABLETS (6 MG TOTAL) BY MOUTH EVERY 6 (SIX) HOURS AS NEEDED FOR MUSCLE SPASMS., Disp: 540 tablet, Rfl: 1   venlafaxine XR (EFFEXOR-XR) 150 MG 24 hr capsule, TAKE 1 CAPSULE BY MOUTH DAILY WITH BREAKFAST. (Patient taking differently: 150 mg daily with breakfast.), Disp: 90 capsule, Rfl: 0   zolpidem (AMBIEN) 5 MG tablet, Take 1 tablet (5 mg total) by mouth at bedtime as needed. for sleep, Disp: 30 tablet, Rfl: 0   testosterone cypionate (DEPOTESTOSTERONE CYPIONATE) 200 MG/ML injection, INJECT 1 ML (200 MG TOTAL) INTO THE MUSCLE EVERY 14 DAYS (Patient not taking: Reported on 08/12/2021), Disp: 2 mL, Rfl: 0  Current Facility-Administered Medications:    testosterone cypionate (DEPOTESTOSTERONE CYPIONATE) injection 200 mg, 200 mg, Intramuscular, Q14 Days, Saguier, Edward, PA-C, 200 mg at 07/06/21 0953       Objective:   Vitals:   08/12/21 1350  BP: 116/62  Pulse: 85  Temp: 97.7 F (36.5 C)  TempSrc: Oral  SpO2: 100%  Weight: 165 lb (74.8 kg)  Height: 5\' 10"  (1.778 m)    Estimated body mass index is 23.68 kg/m as calculated from the following:   Height as of this encounter: 5\' 10"  (1.778 m).   Weight as of this encounter: 165 lb (74.8 kg).  @WEIGHTCHANGE @  [employer]   08/12/21 1350  Weight: 165 lb (74.8 kg)     Physical Exam    General: No distress. Looks better Neuro: Alert and Oriented x 3. GCS 15. Speech normal Psych: Pleasant Resp:  Barrel Chest - no.  Wheeze - o, Crackles - Crackles on the left lung improved from few weeks ago , No overt respiratory distress CVS: Normal heart sounds. Murmurs - n Ext: Stigmata of Connective Tissue Disease - no HEENT: Normal upper airway. PEERL +. No post nasal drip        Assessment:       ICD-10-CM   1. Interstitial pulmonary disease ([place])  J84.9     2. Acute on chronic respiratory failure with hypoxia (HCC)  J96.21     3. History of pulmonary embolism  Z86.711          Plan:     Patient Instructions  Interstitial pulmonary disease ([place]) Acute on chronic respiratory failure with hypoxia (HCC)  -You are significantly better since admission New Year's 2023.  Unclear cause of flareup of interstitial lung disease.  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  Plan - Continue oxygen with exertion and at night - Continue prednisone taper but once again to 10 mg/day continue at that dose  -I am somewhat hesitant to stop  prednisone at this point at least for a few months till we get clarity on your lung disease and see further improvements  History of pulmonary embolism July 17, 2021 and DVT spring 2022   -Glad you are better -History review shows testosterone is a risk factor for pulm embolism  Plan - Given recurrence very likely you need permanent anticoagulation -I do not recommend testosterone unless you were personally willing to take the risk -Anticoagulation decisions by primary care physician   Follow-up - Do spirometry and DLCO in 6-8 weeks -Return to see Dr. Chase Caller after that for 30-minute visit [simple walking desaturation test and symptom score at follow-up]     SIGNATURE    Dr. Brand Males, M.D., F.C.C.P,  Pulmonary and Critical Care Medicine Staff Physician, [facility] Director - Interstitial Lung Disease  Program  Pulmonary [place] [facility] [place], [place], [address]  Pager: [phone], If no answer or between  15:00h - 7:00h: call [phone] Telephone: [phone]  2:32 PM 08/12/2021

## 2021-08-12 NOTE — Patient Instructions (Addendum)
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  Unclear cause of flareup of interstitial lung disease.  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 Chase Caller after that for 30-minute visit [simple walking desaturation test and symptom score at follow-up]

## 2021-08-13 LAB — PROTHROMBIN GENE MUTATION

## 2021-08-13 NOTE — Addendum Note (Signed)
Addended by: Vanessa Barbara on: 08/13/2021 10:06 AM   Modules accepted: Orders

## 2021-08-15 ENCOUNTER — Other Ambulatory Visit: Payer: Self-pay | Admitting: Medical

## 2021-08-16 ENCOUNTER — Encounter: Payer: Self-pay | Admitting: Internal Medicine

## 2021-08-16 ENCOUNTER — Encounter: Payer: Self-pay | Admitting: Family Medicine

## 2021-08-16 ENCOUNTER — Ambulatory Visit (INDEPENDENT_AMBULATORY_CARE_PROVIDER_SITE_OTHER): Payer: Medicare Other | Admitting: Family Medicine

## 2021-08-16 ENCOUNTER — Encounter: Payer: Self-pay | Admitting: *Deleted

## 2021-08-16 ENCOUNTER — Telehealth: Payer: Self-pay | Admitting: Medical

## 2021-08-16 VITALS — BP 120/64 | HR 108 | Temp 97.8°F | Resp 16 | Wt 167.8 lb

## 2021-08-16 DIAGNOSIS — E43 Unspecified severe protein-calorie malnutrition: Secondary | ICD-10-CM

## 2021-08-16 DIAGNOSIS — I2699 Other pulmonary embolism without acute cor pulmonale: Secondary | ICD-10-CM | POA: Diagnosis not present

## 2021-08-16 DIAGNOSIS — R0602 Shortness of breath: Secondary | ICD-10-CM

## 2021-08-16 DIAGNOSIS — J9611 Chronic respiratory failure with hypoxia: Secondary | ICD-10-CM | POA: Diagnosis not present

## 2021-08-16 DIAGNOSIS — T50905A Adverse effect of unspecified drugs, medicaments and biological substances, initial encounter: Secondary | ICD-10-CM | POA: Diagnosis not present

## 2021-08-16 DIAGNOSIS — J449 Chronic obstructive pulmonary disease, unspecified: Secondary | ICD-10-CM

## 2021-08-16 DIAGNOSIS — I480 Paroxysmal atrial fibrillation: Secondary | ICD-10-CM

## 2021-08-16 DIAGNOSIS — E1165 Type 2 diabetes mellitus with hyperglycemia: Secondary | ICD-10-CM

## 2021-08-16 DIAGNOSIS — I10 Essential (primary) hypertension: Secondary | ICD-10-CM

## 2021-08-16 DIAGNOSIS — F419 Anxiety disorder, unspecified: Secondary | ICD-10-CM

## 2021-08-16 DIAGNOSIS — J189 Pneumonia, unspecified organism: Secondary | ICD-10-CM

## 2021-08-16 DIAGNOSIS — R634 Abnormal weight loss: Secondary | ICD-10-CM | POA: Diagnosis not present

## 2021-08-16 DIAGNOSIS — R739 Hyperglycemia, unspecified: Secondary | ICD-10-CM

## 2021-08-16 MED ORDER — MUPIROCIN 2 % EX OINT: 1.0000 "application " | TOPICAL_OINTMENT | Freq: Two times a day (BID) | CUTANEOUS | 1 refills | Status: DC

## 2021-08-16 NOTE — Assessment & Plan Note (Signed)
Well controlled, no changes to meds. Encouraged heart healthy diet such as the DASH diet and exercise as tolerated.  °

## 2021-08-16 NOTE — Patient Instructions (Signed)
Increase Lantus insulin to 12 units and can drop back down if any numbers drop below 110.  Hyperglycemia Hyperglycemia occurs when the level of sugar (glucose) in the blood is too high. Glucose is a type of sugar that provides the body's main source of energy. Certain hormones (insulin and glucagon) control the level of glucose in the blood. Insulin lowers blood glucose, and glucagon increases blood glucose. Hyperglycemia can result from not having enough insulin in the bloodstream, or from the body not responding normally to insulin. Hyperglycemia occurs most often in people who have diabetes (diabetes mellitus), but it can happen in people who do not have diabetes. It can develop quickly, and it can be life-threatening if it causes you to become severely dehydrated (diabetic ketoacidosis or hyperglycemic hyperosmolar state). Severe hyperglycemia is a medical emergency. For most people with diabetes, a blood glucose level above 240 mg/dL is considered hyperglycemia. What are the causes? If you have diabetes, hyperglycemia may be caused by: Medicines that increase blood glucose or affect your diabetes control. Getting less physical activity. Eating more than planned. Being sick or injured, having an infection, or having surgery. Stress. Not giving yourself enough insulin (if you are taking insulin). If you have undiagnosed diabetes, this may be the reason you have hyperglycemia. If you do not have diabetes, hyperglycemia may be caused by: Certain medicines, including: Steroid medicines. Beta-blockers. Epinephrine. Thiazide diuretics. Stress. Having a serious illness, an infection, or surgery. Diseases of the pancreas. What increases the risk? Hyperglycemia is more likely to develop in people who have risk factors for diabetes, such as: Having a family member with diabetes. Certain conditions in which the body's disease-fighting system (immune system) attacks itself (autoimmune  disorders). Being overweight or obese. Having an inactive (sedentary) lifestyle. Having been diagnosed with insulin resistance. Having a history of prediabetes, gestational diabetes, or polycystic ovarian syndrome (PCOS). What are the signs or symptoms? Hyperglycemia may not cause any symptoms. If you do have symptoms, they may include: Increased thirst. Needing to urinate more often than usual. Hunger. Feeling very tired. Blurry vision. Other symptoms may develop if hyperglycemia gets worse, such as: Dry mouth. Abdominal pain. Loss of appetite. Fruity-smelling breath. Weakness. Unexpected weight loss. Tingling or numbness in the hands or feet. Headache. Cuts or bruises that are slow to heal. How is this diagnosed? Hyperglycemia is diagnosed with a blood test to measure your blood glucose level. This blood test is usually done while you are having symptoms. Your health care provider may also do a physical exam and review your medical history. You may have more tests to determine the cause of your hyperglycemia, such as: A fasting blood glucose (FBG) test. You will not be allowed to eat (you will fast) for at least 8 hours before a blood sample is taken. An A1C blood test. This provides information about blood glucose control over the previous 2-3 months. An oral glucose tolerance test (OGTT). This measures your blood glucose at two times: After fasting. This is your baseline blood glucose level. 2 hours after drinking a beverage that contains glucose. How is this treated? Treatment depends on the cause of your hyperglycemia. Treatment may include: Taking medicine to regulate your blood glucose levels. If you take insulin or other diabetes medicines, your medicine or dosage may be adjusted. Lifestyle changes, such as exercising more, eating healthier foods, or losing weight. Treating an illness or infection. Checking your blood glucose more often. Stopping or reducing steroid  medicines. If your hyperglycemia becomes severe  and it results in diabetic ketoacidosis or hyperglycemic hyperosmolar state, you must be hospitalized and given IV fluids and IV insulin. Follow these instructions at home: General instructions Take over-the-counter and prescription medicines only as told by your health care provider. Do not use any products that contain nicotine or tobacco. These products include cigarettes, chewing tobacco, and vaping devices, such as e-cigarettes. If you need help quitting, ask your health care provider. If you drink alcohol: Limit how much you have to: 0-1 drink a day for women who are not pregnant. 0-2 drinks a day for men. Know how much alcohol is in a drink. In the [place] one drink equals one 12 oz bottle of beer (355 mL), one 5 oz glass of wine (148 mL), or one 1 oz glass of hard liquor (44 mL). Learn to manage stress. If you need help with this, ask your health care provider. Do exercises as told by your health care provider. Keep all follow-up visits. This is important. Eating and drinking  Maintain a healthy weight. Stay hydrated, especially when you exercise, get sick, or spend time in hot temperatures. Drink enough fluid to keep your urine pale yellow. If you have diabetes:  Know the symptoms of hyperglycemia. Follow your diabetes management plan as told by your health care provider. Make sure you: Take your insulin and medicines as told. Follow your exercise plan. Follow your meal plan. Eat on time, and do not skip meals. Check your blood glucose as often as told. Make sure to check your blood glucose before and after exercise. If you exercise longer or in a different way, check your blood glucose more often. Follow your sick day plan whenever you cannot eat or drink normally. Make this plan in advance with your health care provider. Share your diabetes management plan with people in your workplace, school, and household. Check your urine for  ketones when you are ill and as told by your health care provider. Carry a medical alert card or wear medical alert jewelry. Where to find more information American Diabetes Association: www.diabetes.org Contact a health care provider if: Your blood glucose is at or above 240 mg/dL (13.3 mmol/L) for 2 days in a row. You have problems keeping your blood glucose in your target range. You have frequent episodes of hyperglycemia. You have signs of illness, such as nausea, vomiting, or fever. Get help right away if: Your blood glucose monitor reads "high" even when you are taking insulin. You have trouble breathing. You have a change in how you think, feel, or act (mental status). You have nausea or vomiting that does not go away. These symptoms may represent a serious problem that is an emergency. Do not wait to see if the symptoms will go away. Get medical help right away. Call your local emergency services (911 in the [place] Do not drive yourself to the hospital. Summary Hyperglycemia occurs when the level of sugar (glucose) in the blood is too high. Hyperglycemia can happen with or without diabetes, and severe hyperglycemia can be life-threatening. Hyperglycemia is diagnosed with a blood test to measure your blood glucose level. This blood test is usually done while you are having symptoms. Your health care provider may also do a physical exam and review your medical history. If you have diabetes, follow your diabetes management plan as told by your health care provider. Contact your health care provider if you have problems keeping your blood glucose in your target range. This information is not intended to  replace advice given to you by your health care provider. Make sure you discuss any questions you have with your health care provider. Document Revised: 04/17/2020 Document Reviewed: 04/17/2020 Elsevier Patient Education  2022 [employer].

## 2021-08-16 NOTE — Telephone Encounter (Signed)
During patient's checkout, wife request the 6 week follow up to be scheduled with Dr. [name]. She was informed that Dr [name] is booked up for some time and since Luis Brown is the pt's PCP the f/u would be with Luis Brown. She agreed to make f/u with Luis Brown but wanted to be added to [facility] scheduled. She was informed that a message would be sent but it would be unlike due to Dr. Frederik Pear availability and the fact that Luis Brown is PCP. She stated she understood but still wanted a message sent.

## 2021-08-16 NOTE — Assessment & Plan Note (Signed)
hgba1c acceptable, minimize simple carbs. Increase exercise as tolerated.  

## 2021-08-16 NOTE — Assessment & Plan Note (Signed)
Aspirated on medication and developed pneumonia but is improving on current meds

## 2021-08-16 NOTE — Assessment & Plan Note (Signed)
Encouraged to increase protein in diet and discussed strategies but if unable to increase can consider a nutrition consult

## 2021-08-16 NOTE — Assessment & Plan Note (Signed)
Rate controlled and tolerating Lovenox, is being maintained on that til his cardiac ablation in March.

## 2021-08-16 NOTE — Progress Notes (Signed)
Subjective:   By signing my name below, I, [name], attest that this documentation has been prepared under the direction and in the presence of Bradd Canary, MD. 08/16/2021      [name], male    DOB: Mar 01, 1946, 76 y.o.   MRN: [id number]  Chief Complaint  Patient presents with   Pneumonia   Hospitalization Follow-up    HPI Patient is in today for an office visit and hospital f/u. He is accompanied by his wife.  He was admitted into the ED on 01/21 he had trouble breathing after aspirating a vitamin. He had significant dyspnea after. He was diagnosed with pneumonia prior to his admission by pulmonology. Discharged on 01/22  Today, he reports he is experiencing dyspnea but is trying to be more active. He is vacuuming and doing more household activities. His appetite waxes and wanes but he still eats about 3 meals a day and is trying to eat more protein.   He also reports he has been having nose bleeds for the past few months but they have been worse over the past few days.He is on blood thinners and notes the bleed is always from the right nostril. Yesterday's episode lasted for a couple of hours.  He is still following up with pulmonology and has been told he might get off his oxygen machine.   His wife is inquiring if there is any medication he can use to manage his fatigue since he stopped testosterone. He is also using prednisone and 10 units Lantus insulin injections. He checks his blood sugar levels at home and yesterday's reading was 220.  He thinks he is dealing with the stress of all his clinical problems well. He does not think he needs any more medication. He is using 150 mg Effexor and 5 mg Ambien. He will like to start xanax again.    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   Esophageal reflux 02/10/2015   Fatigue    HTN (hypertension)    Hyperglycemia    preDM    Hyperlipidemia    Insomnia    Low back pain radiating to both legs 01/16/2017   Measles as a child   Overweight(278.02)    Peripheral neuropathy 08/07/2019   Personal history of colonic polyps 10/27/2012   Follows with [facility]   Pneumonia    " walking"   Preventative health care    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   Wears glasses     Past Surgical History:  Procedure Laterality Date   BACK SURGERY  2012 and 1994   Dr Charlesetta Garibaldi, screws and cage in low back   BIOPSY  01/09/2020   Procedure: BIOPSY;  Surgeon: Kathi Der, MD;  Location: [facility];  Service: Gastroenterology;;   BRONCHIAL BIOPSY  11/17/2020   Procedure: BRONCHIAL BIOPSIES;  Surgeon: Tomma Lightning, MD;  Location: [facility];  Service: Endoscopy;;   BRONCHIAL BRUSHINGS  11/17/2020   Procedure: BRONCHIAL BRUSHINGS;  Surgeon: Laurin Coder, MD;  Location: [facility];  Service: Endoscopy;;   BRONCHIAL WASHINGS  11/17/2020   Procedure: BRONCHIAL WASHINGS;  Surgeon: Laurin Coder, MD;  Location: [facility];  Service: Endoscopy;;   COLONOSCOPY WITH PROPOFOL N/A 01/09/2020   Procedure: COLONOSCOPY WITH PROPOFOL;  Surgeon: Otis Brace, MD;  Location: [facility];  Service: Gastroenterology;  Laterality: N/A;   ESOPHAGOGASTRODUODENOSCOPY (EGD) WITH PROPOFOL N/A 01/09/2020   Procedure: ESOPHAGOGASTRODUODENOSCOPY (EGD) WITH PROPOFOL;  Surgeon: Otis Brace, MD;  Location: [facility];  Service: Gastroenterology;  Laterality: N/A;   EYE SURGERY Bilateral    2016   HARDWARE REVISION  03/12/2019   REVISION OF HARDWARE THORACIC  TEN-THORACIC ELEVEN WITH APPLICATION OF ADDITIONAL RODS AND ROD SLEEVES THORACIC EIGHT-THORACIC TWELVE 12-LUMBAR ONE (N/A )   HEMOSTASIS CONTROL  11/17/2020   Procedure: HEMOSTASIS CONTROL;  Surgeon: Laurin Coder, MD;  Location: [facility];  Service: Endoscopy;;   IR US GUIDE BX ASP/DRAIN  11/13/2020   LAMINECTOMY  02/12/2019   DECOMPRESSIVE LAMINECTOMY THORACIC NINE-THORACIC TEN AND THORACIC TEN-THORACIC ELEVEN, EXTENSION OF THORACOLUMBAR FUSION FROM THORACIC TEN TO THORACIC FIVE, LOCAL BONE GRAFT, ALLOGRAFT AND VIVIGEN (N/A)   POLYPECTOMY  01/09/2020   Procedure: POLYPECTOMY;  Surgeon: Otis Brace, MD;  Location: [facility];  Service: Gastroenterology;;   REMOVAL OF RODS AND PEDICLE SCREWS T5 AND T6, EXPLORATION OF FUSION, BIOPSY TRANSPEDICULAR T5 AND T6 (N/A )  09/14/2020   SPINAL FUSION N/A 02/12/2019   Procedure: DECOMPRESSIVE LAMINECTOMY THORACIC NINE-THORACIC TEN AND THORACIC TEN-THORACIC ELEVEN, EXTENSION OF THORACOLUMBAR FUSION FROM THORACIC TEN TO THORACIC FIVE, LOCAL BONE GRAFT, ALLOGRAFT AND VIVIGEN;  Surgeon: Jessy Oto, MD;  Location: [facility];  Service: Orthopedics;  Laterality: N/A;  DECOMPRESSIVE LAMINECTOMY THORACIC NINE-THORACIC TEN AND THORACIC TEN-THORACIC ELEVEN, EXTENSION OF TH   SPINAL FUSION N/A 03/12/2019   Procedure: REVISION OF HARDWARE THORACIC TEN-THORACIC ELEVEN WITH APPLICATION OF ADDITIONAL RODS AND ROD SLEEVES THORACIC EIGHT-THORACIC TWELVE 12-LUMBAR ONE;  Surgeon: Jessy Oto, MD;  Location: [facility];  Service: Orthopedics;  Laterality: N/A;   TONSILLECTOMY     as child   torn rotator cuff  2010   right   VIDEO BRONCHOSCOPY N/A 11/17/2020   Procedure: VIDEO BRONCHOSCOPY WITH FLUORO;  Surgeon: Laurin Coder, MD;  Location: [facility];  Service: Endoscopy;  Laterality: N/A;    Family History  Problem Relation Age of Onset   Hypertension Mother    Diabetes Mother        type 2   Cancer Mother 51       breast in remission   Emphysema Father         smoker   COPD Father        smoker   Stroke Father 76       mini   Heart disease Father    Hypertension Sister    Hyperlipidemia Sister    Scoliosis Sister    Osteoporosis Sister    Hypertension Maternal Grandmother    Scoliosis Maternal Grandmother    Heart disease Maternal Grandfather    Heart disease Paternal Grandfather        smoker   Heart disease Daughter     Social History   Socioeconomic History   Marital status: Married    Spouse name: Not on file   Number of children: Not on file   Years of education: Not on file   Highest education level: Not on file  Occupational History   Not on file  Tobacco Use  Smoking status: Former    Packs/day: 1.00    Years: 20.00    Pack years: 20.00    Types: Cigarettes    Quit date: 07/18/1989    Years since quitting: 32.1   Smokeless tobacco: Never  Vaping Use   Vaping Use: Never used  Substance and Sexual Activity   Alcohol use: Not Currently   Drug use: No   Sexual activity: Yes    Comment: lives with wife, still working, no dietary restrictions, continues to exercise intermittently  Other Topics Concern   Not on file  Social History Narrative   Not on file   Social Determinants of Health   Financial Resource Strain: Low Risk    Difficulty of Paying Living Expenses: Not hard at all  Food Insecurity: No Food Insecurity   Worried About Charity fundraiser in the Last Year: Never true   Arboriculturist in the Last Year: Never true  Transportation Needs: No Transportation Needs   Lack of Transportation (Medical): No   Lack of Transportation (Non-Medical): No  Physical Activity: Sufficiently Active   Days of Exercise per Week: 7 days   Minutes of Exercise per Session: 30 min  Stress: No Stress Concern Present   Feeling of Stress : Not at all  Social Connections: Moderately Integrated   Frequency of Communication with Friends and Family: More than three times a week   Frequency of Social Gatherings with  Friends and Family: More than three times a week   Attends Religious Services: More than 4 times per year   Active Member of [employer] or Organizations: No   Attends Archivist Meetings: Never   Marital Status: Married  Human resources officer Violence: Not At Risk   Fear of Current or Ex-Partner: No   Emotionally Abused: No   Physically Abused: No   Sexually Abused: No    Outpatient Medications Prior to Visit  Medication Sig Dispense Refill   albuterol (PROVENTIL HFA) 108 (90 Base) MCG/ACT inhaler TAKE 2 PUFFS BY MOUTH EVERY 6 HOURS AS NEEDED FOR WHEEZE OR SHORTNESS OF BREATH 8 g 5   atorvastatin (LIPITOR) 10 MG tablet TAKE 1 TABLET BY MOUTH EVERY DAY 30 tablet 0   DALVANCE 500 MG SOLR Inject 500 mg into the vein every 14 (fourteen) days. Pt states this is a every 2 week antibiotic. Could not state how long therapy should be.     digoxin (LANOXIN) 0.125 MG tablet Take 1 tablet (0.125 mg total) by mouth daily. 90 tablet 3   enoxaparin (LOVENOX) 80 MG/0.8ML injection Inject 0.8 mLs (80 mg total) into the skin every 12 (twelve) hours. 80 mL 1   glucose blood (CONTOUR NEXT TEST) test strip Check blood sugar once daily 100 each 12   insulin glargine (LANTUS) 100 UNIT/ML Solostar Pen Inject 10 Units into the skin daily. 15 mL 0   Insulin Pen Needle (PEN NEEDLES 3/16") 31G X 5 MM MISC Use with Lantus 100 each 0   metFORMIN (GLUCOPHAGE) 1000 MG tablet TAKE 1 TABLET (1,000 MG TOTAL) BY MOUTH 2 (TWO) TIMES DAILY WITH A MEAL. 180 tablet 0   oxyCODONE-acetaminophen (PERCOCET/ROXICET) 5-325 MG tablet Take 1 tablet by mouth every 4 (four) hours as needed for moderate pain.     pantoprazole (PROTONIX) 40 MG tablet TAKE 1 TABLET BY MOUTH TWICE A DAY BEFORE A MEAL (Patient taking differently: 40 mg 2 (two) times daily before a meal.) 180 tablet 1   predniSONE (DELTASONE) 10 MG tablet  Take 4 tablets (40 mg total) by mouth daily with breakfast for 10 days, THEN 3 tablets (30 mg total) daily with breakfast for  10 days, THEN 2 tablets (20 mg total) daily with breakfast for 10 days, THEN 1 tablet (10 mg total) daily with breakfast for 10 days. 100 tablet 0   pregabalin (LYRICA) 75 MG capsule TAKE 1 CAPSULE BY MOUTH TWICE A DAY (Patient taking differently: Take 75 mg by mouth 2 (two) times daily.) 60 capsule 3   sitaGLIPtin (JANUVIA) 50 MG tablet Take 1 tablet (50 mg total) by mouth daily. 30 tablet 3   STIOLTO RESPIMAT 2.5-2.5 MCG/ACT AERS Inhale 2 puffs into the lungs daily.     tamsulosin (FLOMAX) 0.4 MG CAPS capsule Take 2 capsules (0.8 mg total) by mouth daily. (Patient taking differently: Take 0.8 mg by mouth at bedtime.) 180 capsule 3   tiZANidine (ZANAFLEX) 4 MG tablet TAKE 1.5 TABLETS (6 MG TOTAL) BY MOUTH EVERY 6 (SIX) HOURS AS NEEDED FOR MUSCLE SPASMS. 540 tablet 1   venlafaxine XR (EFFEXOR-XR) 150 MG 24 hr capsule TAKE 1 CAPSULE BY MOUTH DAILY WITH BREAKFAST. (Patient taking differently: 150 mg daily with breakfast.) 90 capsule 0   zolpidem (AMBIEN) 5 MG tablet Take 1 tablet (5 mg total) by mouth at bedtime as needed. for sleep 30 tablet 0   testosterone cypionate (DEPOTESTOSTERONE CYPIONATE) 200 MG/ML injection INJECT 1 ML (200 MG TOTAL) INTO THE MUSCLE EVERY 14 DAYS (Patient not taking: Reported on 08/12/2021) 2 mL 0   Facility-Administered Medications Prior to Visit  Medication Dose Route Frequency Provider Last Rate Last Admin   testosterone cypionate (DEPOTESTOSTERONE CYPIONATE) injection 200 mg  200 mg Intramuscular Q14 Days Saguier, Percell Miller, PA-C   200 mg at 07/06/21 7425    Allergies  Allergen Reactions   Daptomycin     Drug induced pneumonia    Diltiazem Other (See Comments)    Passed out    Review of Systems  Constitutional:  Positive for malaise/fatigue. Negative for fever.  HENT:  Positive for nosebleeds. Negative for congestion.   Eyes:  Negative for redness.  Respiratory:  Positive for shortness of breath.   Cardiovascular:  Negative for chest pain, palpitations and leg  swelling.  Gastrointestinal:  Negative for abdominal pain, blood in stool and nausea.  Genitourinary:  Negative for dysuria and frequency.  Musculoskeletal:  Negative for falls.  Skin:  Negative for rash.  Neurological:  Negative for dizziness, loss of consciousness and headaches.  Endo/Heme/Allergies:  Negative for polydipsia.  Psychiatric/Behavioral:  Negative for depression. The patient is not nervous/anxious.       Objective:    Physical Exam Constitutional:      Appearance: Normal appearance. He is not ill-appearing.  HENT:     Head: Normocephalic and atraumatic.     Right Ear: Tympanic membrane, ear canal and external ear normal.     Left Ear: Tympanic membrane, ear canal and external ear normal.  Eyes:     Conjunctiva/sclera: Conjunctivae normal.  Cardiovascular:     Rate and Rhythm: Normal rate. Rhythm irregular.     Heart sounds: Normal heart sounds. No murmur heard. Pulmonary:     Breath sounds: Examination of the right-upper field reveals decreased breath sounds. Decreased breath sounds present. No wheezing.  Abdominal:     General: Bowel sounds are normal. There is no distension.     Palpations: Abdomen is soft.     Tenderness: There is no abdominal tenderness.     Hernia: No  hernia is present.  Musculoskeletal:     Cervical back: Neck supple.  Lymphadenopathy:     Cervical: No cervical adenopathy.  Skin:    General: Skin is warm and dry.  Neurological:     Mental Status: He is alert and oriented to person, place, and time.  Psychiatric:        Behavior: Behavior normal.    BP 120/64    Pulse (!) 108    Temp 97.8 F (36.6 C)    Resp 16    Wt 167 lb 12.8 oz (76.1 kg)    SpO2 94%    BMI 24.08 kg/m  Wt Readings from Last 3 Encounters:  08/16/21 167 lb 12.8 oz (76.1 kg)  08/12/21 165 lb (74.8 kg)  08/07/21 165 lb (74.8 kg)    Diabetic Foot Exam - Simple   No data filed    Lab Results  Component Value Date   WBC 10.7 (H) 08/08/2021   HGB 11.8 (L)  08/08/2021   HCT 34.9 (L) 08/08/2021   PLT 266 08/08/2021   GLUCOSE 370 (H) 08/08/2021   CHOL 112 07/01/2020   TRIG 66.0 07/01/2020   HDL 56.10 07/01/2020   LDLDIRECT 64.0 10/30/2017   LDLCALC 42 07/01/2020   ALT 16 08/07/2021   AST 24 08/07/2021   NA 128 (L) 08/08/2021   K 4.2 08/08/2021   CL 93 (L) 08/08/2021   CREATININE 0.69 08/08/2021   BUN 18 08/08/2021   CO2 24 08/08/2021   TSH 1.816 07/16/2021   PSA 0.65 10/08/2019   INR 1.0 08/07/2021   HGBA1C 7.4 (H) 07/15/2021   MICROALBUR 0.61 04/23/2012    Lab Results  Component Value Date   TSH 1.816 07/16/2021   Lab Results  Component Value Date   WBC 10.7 (H) 08/08/2021   HGB 11.8 (L) 08/08/2021   HCT 34.9 (L) 08/08/2021   MCV 89.7 08/08/2021   PLT 266 08/08/2021   Lab Results  Component Value Date   NA 128 (L) 08/08/2021   K 4.2 08/08/2021   CO2 24 08/08/2021   GLUCOSE 370 (H) 08/08/2021   BUN 18 08/08/2021   CREATININE 0.69 08/08/2021   BILITOT 1.1 08/07/2021   ALKPHOS 54 08/07/2021   AST 24 08/07/2021   ALT 16 08/07/2021   PROT 6.4 (L) 08/07/2021   ALBUMIN 4.0 08/07/2021   CALCIUM 8.4 (L) 08/08/2021   ANIONGAP 11 08/08/2021   EGFR 98 02/01/2021   GFR 95.17 01/05/2021   Lab Results  Component Value Date   CHOL 112 07/01/2020   Lab Results  Component Value Date   HDL 56.10 07/01/2020   Lab Results  Component Value Date   LDLCALC 42 07/01/2020   Lab Results  Component Value Date   TRIG 66.0 07/01/2020   Lab Results  Component Value Date   CHOLHDL 2 07/01/2020   Lab Results  Component Value Date   HGBA1C 7.4 (H) 07/15/2021       Assessment & Plan:   Problem List Items Addressed This Visit     Essential hypertension    Well controlled, no changes to meds. Encouraged heart healthy diet such as the DASH diet and exercise as tolerated.       Relevant Orders   CBC   Comprehensive metabolic panel   Diabetes mellitus with hyperglycemia (HCC) - Primary    hgba1c acceptable, minimize  simple carbs. Increase exercise as tolerated. Continue current meds      Relevant Orders   Hemoglobin A1c  Protein-calorie malnutrition, severe    Encouraged to increase protein in diet and discussed strategies but if unable to increase can consider a nutrition consult      Relevant Orders   CBC   Comprehensive metabolic panel   Hemoglobin A1c   Drug-induced pneumonitis    Aspirated on medication and developed pneumonia but is improving on current meds      SOB (shortness of breath)    Is still being maintained on oxygen but his dyspnea is slowly improving.       Anxiety   Paroxysmal A-fib ([place])    Rate controlled and tolerating Lovenox, is being maintained on that til his cardiac ablation in March.       Pulmonary emboli ([place])    Repeat CT scan shows resolution, hematologic work up does not see any clotting abnormalities is on Lovenox for now until his cardiac       Chronic respiratory failure with hypoxia (HCC)   RESOLVED: Weight loss   RESOLVED: Hyperglycemia    hgba1c acceptable, minimize simple carbs. Increase exercise as tolerated.        Meds ordered this encounter  Medications   mupirocin ointment (BACTROBAN) 2 %    Sig: Apply 1 application topically 2 (two) times daily.    Dispense:  22 g    Refill:  1    I,[name],acting as a scribe for Penni Homans, MD.,have documented all relevant documentation on the behalf of Penni Homans, MD,as directed by  Penni Homans, MD while in the presence of Penni Homans, MD.   I, [name], MD. , personally preformed the services described in this documentation.  All medical record entries made by the scribe were at my direction and in my presence.  I have reviewed the chart and discharge instructions (if applicable) and agree that the record reflects my personal performance and is accurate and complete. 08/16/2021

## 2021-08-16 NOTE — Assessment & Plan Note (Signed)
Is still being maintained on oxygen but his dyspnea is slowly improving.

## 2021-08-16 NOTE — Assessment & Plan Note (Signed)
Repeat CT scan shows resolution, hematologic work up does not see any clotting abnormalities is on Lovenox for now until his cardiac

## 2021-08-16 NOTE — Assessment & Plan Note (Signed)
hgba1c acceptable, minimize simple carbs. Increase exercise as tolerated. Continue current meds 

## 2021-08-17 ENCOUNTER — Other Ambulatory Visit: Payer: Self-pay | Admitting: Medical

## 2021-08-17 ENCOUNTER — Encounter: Payer: Self-pay | Admitting: Family Medicine

## 2021-08-17 LAB — COMPREHENSIVE METABOLIC PANEL
ALT: 11 U/L (ref 0–53)
AST: 15 U/L (ref 0–37)
Albumin: 4.3 g/dL (ref 3.5–5.2)
Alkaline Phosphatase: 55 U/L (ref 39–117)
BUN: 12 mg/dL (ref 6–23)
CO2: 31 mEq/L (ref 19–32)
Calcium: 9.3 mg/dL (ref 8.4–10.5)
Chloride: 88 mEq/L — ABNORMAL LOW (ref 96–112)
Creatinine, Ser: 0.74 mg/dL (ref 0.40–1.50)
GFR: 87.11 mL/min (ref >60.00)
Glucose, Bld: 399 mg/dL — ABNORMAL HIGH (ref 70–99)
Potassium: 4.9 mEq/L (ref 3.5–5.1)
Sodium: 126 mEq/L — ABNORMAL LOW (ref 135–145)
Total Bilirubin: 0.5 mg/dL (ref 0.2–1.2)
Total Protein: 6.3 g/dL (ref 6.0–8.3)

## 2021-08-17 LAB — CBC
HCT: 38.8 % — ABNORMAL LOW (ref 39.0–52.0)
Hemoglobin: 12.9 g/dL — ABNORMAL LOW (ref 13.0–17.0)
MCHC: 33.3 g/dL (ref 30.0–36.0)
MCV: 89.8 fl (ref 78.0–100.0)
Platelets: 318 10*3/uL (ref 150.0–400.0)
RBC: 4.32 Mil/uL (ref 4.22–5.81)
RDW: 16 % — ABNORMAL HIGH (ref 11.5–15.5)
WBC: 8.5 10*3/uL (ref 4.0–10.5)

## 2021-08-17 LAB — HEMOGLOBIN A1C: Hgb A1c MFr Bld: 10.2 % — ABNORMAL HIGH (ref 4.6–6.5)

## 2021-08-17 NOTE — Telephone Encounter (Signed)
Yeah this is fine 

## 2021-08-17 NOTE — Telephone Encounter (Signed)
Please advise on order for adapter for the oxygen concentrator. Thanks

## 2021-08-18 ENCOUNTER — Encounter: Payer: Self-pay | Admitting: Internal Medicine

## 2021-08-18 ENCOUNTER — Other Ambulatory Visit: Payer: Self-pay | Admitting: Family Medicine

## 2021-08-18 DIAGNOSIS — R040 Epistaxis: Secondary | ICD-10-CM

## 2021-08-18 MED ORDER — PREDNISONE 10 MG PO TABS: 10.0000 mg | ORAL_TABLET | Freq: Every day | ORAL | 3 refills | Status: DC

## 2021-08-24 ENCOUNTER — Telehealth: Payer: Self-pay | Admitting: Internal Medicine

## 2021-08-24 DIAGNOSIS — R0602 Shortness of breath: Secondary | ICD-10-CM | POA: Diagnosis not present

## 2021-08-24 DIAGNOSIS — G894 Chronic pain syndrome: Secondary | ICD-10-CM | POA: Diagnosis not present

## 2021-08-24 DIAGNOSIS — M150 Primary generalized (osteo)arthritis: Secondary | ICD-10-CM | POA: Diagnosis not present

## 2021-08-24 DIAGNOSIS — M961 Postlaminectomy syndrome, not elsewhere classified: Secondary | ICD-10-CM | POA: Diagnosis not present

## 2021-08-24 DIAGNOSIS — J9601 Acute respiratory failure with hypoxia: Secondary | ICD-10-CM | POA: Diagnosis not present

## 2021-08-24 DIAGNOSIS — M4624 Osteomyelitis of vertebra, thoracic region: Secondary | ICD-10-CM | POA: Diagnosis not present

## 2021-08-24 NOTE — Telephone Encounter (Signed)
Called and left detailed message per DPR that on 08/19/21 10mg  of Prednisone was sent in with 3 refills to the [facility] on [address]. Told him told if he needed anything else to call office and let us know.   Nothing further

## 2021-08-24 NOTE — Telephone Encounter (Signed)
Lm for patient.  

## 2021-08-26 DIAGNOSIS — G062 Extradural and subdural abscess, unspecified: Secondary | ICD-10-CM | POA: Diagnosis not present

## 2021-08-26 DIAGNOSIS — M4325 Fusion of spine, thoracolumbar region: Secondary | ICD-10-CM | POA: Diagnosis not present

## 2021-08-26 DIAGNOSIS — M4635 Infection of intervertebral disc (pyogenic), thoracolumbar region: Secondary | ICD-10-CM | POA: Diagnosis not present

## 2021-08-31 ENCOUNTER — Encounter: Payer: Self-pay | Admitting: Pharmacist

## 2021-09-01 ENCOUNTER — Ambulatory Visit (HOSPITAL_BASED_OUTPATIENT_CLINIC_OR_DEPARTMENT_OTHER)
Admission: RE | Admit: 2021-09-01 | Discharge: 2021-09-01 | Disposition: A | Discharge status: Home / Self Care | Payer: Medicare Other | Source: Ambulatory Visit | Attending: Family | Admitting: Family

## 2021-09-01 ENCOUNTER — Encounter (HOSPITAL_BASED_OUTPATIENT_CLINIC_OR_DEPARTMENT_OTHER): Payer: Self-pay

## 2021-09-01 ENCOUNTER — Other Ambulatory Visit: Payer: Self-pay

## 2021-09-01 ENCOUNTER — Ambulatory Visit: Payer: Medicare Other | Admitting: Specialist

## 2021-09-01 DIAGNOSIS — I2699 Other pulmonary embolism without acute cor pulmonale: Secondary | ICD-10-CM | POA: Insufficient documentation

## 2021-09-01 DIAGNOSIS — R911 Solitary pulmonary nodule: Secondary | ICD-10-CM | POA: Diagnosis not present

## 2021-09-01 DIAGNOSIS — J439 Emphysema, unspecified: Secondary | ICD-10-CM | POA: Diagnosis not present

## 2021-09-01 MED ORDER — IOHEXOL 350 MG/ML SOLN
100.0000 mL | Freq: Once | INTRAVENOUS | Status: AC | PRN
  Administered 2021-09-01: 100 mL via INTRAVENOUS

## 2021-09-01 NOTE — Telephone Encounter (Signed)
Lab orders

## 2021-09-07 ENCOUNTER — Telehealth: Payer: Self-pay | Admitting: Pharmacist

## 2021-09-07 ENCOUNTER — Encounter: Payer: Self-pay | Admitting: Medical

## 2021-09-07 ENCOUNTER — Encounter: Payer: Self-pay | Admitting: Cardiology

## 2021-09-07 DIAGNOSIS — I480 Paroxysmal atrial fibrillation: Secondary | ICD-10-CM

## 2021-09-07 MED ORDER — WARFARIN SODIUM 5 MG PO TABS: 5.0000 mg | ORAL_TABLET | Freq: Every day | ORAL | 0 refills | Status: DC

## 2021-09-07 NOTE — Telephone Encounter (Signed)
Received message from patient.  Has been taking 80mg  Lovenox twice daily since 07/24/21.  Has ablation scheduled 10/12/20.  Has developed a large bruise on his side.    Has PAF and was found to have PE while hospitalized for PNA while on Xarelto.    Has upcoming appt with Dr Harriet Masson on 09/10/21.  Recommended to patient to transition from Lovenox to warfarin with first INR check on Friday. Explained patient would need weekly INR checks due to ablation scheduled in 1 month.  Patient voiced understanding.  Advised I would send in initial warfarin prescription for patient and have him overlap Lovenox and warfarin until INR is therapeutic.  Patient has plenty of Lovenox at home.    Scheduled new coumadin appointment for Friday before appt with Dr Harriet Masson

## 2021-09-08 MED ORDER — ZOLPIDEM TARTRATE 5 MG PO TABS: 5.0000 mg | ORAL_TABLET | Freq: Every evening | ORAL | 0 refills | Status: DC | PRN

## 2021-09-08 NOTE — Addendum Note (Signed)
Addended by: Anabel Halon on: 09/08/2021 01:53 PM   Modules accepted: Orders

## 2021-09-09 ENCOUNTER — Ambulatory Visit: Payer: Medicare Other | Admitting: Surgery

## 2021-09-09 ENCOUNTER — Other Ambulatory Visit: Payer: Self-pay

## 2021-09-09 ENCOUNTER — Encounter: Payer: Self-pay | Admitting: Surgery

## 2021-09-09 ENCOUNTER — Ambulatory Visit: Payer: Self-pay

## 2021-09-09 DIAGNOSIS — M4325 Fusion of spine, thoracolumbar region: Secondary | ICD-10-CM

## 2021-09-09 DIAGNOSIS — G8929 Other chronic pain: Secondary | ICD-10-CM

## 2021-09-09 DIAGNOSIS — M7542 Impingement syndrome of left shoulder: Secondary | ICD-10-CM

## 2021-09-09 DIAGNOSIS — I480 Paroxysmal atrial fibrillation: Secondary | ICD-10-CM

## 2021-09-09 DIAGNOSIS — M25512 Pain in left shoulder: Secondary | ICD-10-CM | POA: Diagnosis not present

## 2021-09-09 DIAGNOSIS — G062 Extradural and subdural abscess, unspecified: Secondary | ICD-10-CM | POA: Diagnosis not present

## 2021-09-09 DIAGNOSIS — M4635 Infection of intervertebral disc (pyogenic), thoracolumbar region: Secondary | ICD-10-CM | POA: Diagnosis not present

## 2021-09-09 NOTE — Progress Notes (Signed)
Office Visit Note   Patient: Luis Brown           Date of Birth: [birth year]           MRN: [id number] Visit Date: 09/09/2021              Requested by: Mackie Pai, PA-C [address],  [place] [address] PCP: Mackie Pai, PA-C   Assessment & Plan: Visit Diagnoses:  1. Chronic left shoulder pain   2. Impingement syndrome of left shoulder     Plan: With patient's acute on chronic left shoulder pain I will go ahead and schedule MRI to rule out rotator cuff tear and possible long head biceps tendon rupture.  He will follow-up with me after completion in 3 weeks to discuss results and I will make appropriate referral at that point.  He stated that Dr. Marlou Sa is operated on his right shoulder in the past.  I think he is doing well from his thoracic spine surgery.  Follow-Up Instructions: Return in about 3 weeks (around 09/30/2021) for with Lynore Coscia to review left shoulder mri.   Orders:  Orders Placed This Encounter  Procedures   XR Shoulder Left   No orders of the defined types were placed in this encounter.     Procedures: No procedures performed   Clinical Data: No additional findings.   Subjective: Chief Complaint  Patient presents with   Spine - Follow-up, Pain   Left Shoulder - Pain    HPI 76 year old white male comes in today with complaints of acute on chronic left shoulder pain.  States that Dr. Louanne Skye has performed injections several years ago that did pretty well.  He has had some pain over the last couple months with overactivity.  States he was recently putting up a bird feeder and when he woke up in the morning he had a lot of bruising in the right upper anterior arm.  Patient is on chronic Coumadin.  States that he reach out to his physician and is supposed to get evaluated for the acute bruising.  He describes having pain around the proximal biceps.  Feels like he also has a knot in this area.  Pain when he was lifting  objects.   Objective: Vital Signs: There were no vitals taken for this visit.  Physical Exam HENT:     Head: Normocephalic.     Nose: Nose normal.  Eyes:     Extraocular Movements: Extraocular movements intact.  Pulmonary:     Effort: No respiratory distress.  Musculoskeletal:     Comments: On exam he does have mildly positive impingement test left shoulder.  Left shoulder good range of motion but with discomfort.  He does have diffuse bruising of the left bicep area.  Proximal bicep is tender to palpation he does have a palpable defect with elbow resisted flexion.  Negative drop arm test  Neurological:     Mental Status: He is alert and oriented to person, place, and time.  Psychiatric:        Mood and Affect: Mood normal.    Ortho Exam  Specialty Comments:  No specialty comments available.  Imaging: No results found.   PMFS History: Patient Active Problem List   Diagnosis Date Noted   Aspiration pneumonitis ([place]) 08/07/2021   Chronic respiratory failure with hypoxia ([place]) 08/07/2021   Pulmonary emboli ([place]) 07/16/2021   Bronchiectasis with acute exacerbation ([place]) 07/16/2021   CAP (community acquired pneumonia) 07/15/2021  History of DVT (deep vein thrombosis) 07/15/2021   Paroxysmal A-fib ([place]) 07/15/2021   COPD (chronic obstructive pulmonary disease) ([place]) 02/10/2021   Palpitations 02/01/2021   Wears glasses 01/11/2021   Pneumothorax, right 12/15/2020   Anxiety 12/01/2020   SOB (shortness of breath)    Acute respiratory failure with hypoxia ([facility]) 11/14/2020   Drug-induced pneumonitis 11/10/2020   S/P bronchoscopy 09/14/2020   Gastric ulcer 01/27/2020   H/O colonoscopy with polypectomy 01/27/2020   Protein-calorie malnutrition, severe 01/09/2020   Abdominal pain 12/30/2019   Nausea and vomiting 12/30/2019   Weight loss, abnormal 12/30/2019   Change in bowel habits 12/30/2019   Medication monitoring encounter 08/26/2019   Peripheral neuropathy 08/07/2019    Hyponatremia 05/05/2019   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[place]) 04/04/2019   Fusion of spine of thoracolumbar region 03/12/2019   Status post thoracic spinal fusion 02/12/2019   Thoracic spinal stenosis 04/17/2017   Painful orthopaedic hardware ([place]) 04/17/2017   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 ([place]) 04/17/2017   Low back pain radiating to both legs 01/16/2017   Esophageal reflux 02/10/2015   Essential hypertension 06/25/2014   Asthma 06/25/2014   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   Overweight 06/25/2014   Other abnormal glucose 06/25/2014   Spinal stenosis of lumbar region without neurogenic claudication 04/09/2014   Flatback syndrome 04/09/2014   Chicken pox 10/21/2013   History of colonic polyps 10/27/2012   Testosterone deficiency 05/23/2012   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   Benign enlargement of prostate 04/23/2012   ED (erectile dysfunction) 04/23/2012   Allergic state 10/25/2011   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   Esophageal reflux 02/10/2015   Fatigue    HTN (hypertension)    Hyperglycemia    preDM    Hyperlipidemia    Insomnia    Low back pain radiating to both legs 01/16/2017   Measles as a child    Overweight(278.02)    Peripheral neuropathy 08/07/2019   Personal history of colonic polyps 10/27/2012   Follows with [facility]   Pneumonia    " walking"   Preventative health care    Testosterone deficiency 05/23/2012   Wears glasses     Family History  Problem Relation Age of Onset   Hypertension Mother    Diabetes Mother        type 2   Cancer Mother 51       breast in remission   Emphysema Father        smoker   COPD Father        smoker   Stroke Father 7       mini   Heart disease Father    Hypertension Sister  Hyperlipidemia Sister    Scoliosis Sister    Osteoporosis Sister    Hypertension Maternal Grandmother    Scoliosis Maternal Grandmother    Heart disease Maternal Grandfather    Heart disease Paternal Grandfather        smoker   Heart disease Daughter     Past Surgical History:  Procedure Laterality Date   BACK SURGERY  2012 and 1994   Dr Margret Chance, screws and cage in low back   BIOPSY  01/09/2020   Procedure: BIOPSY;  Surgeon: Otis Brace, MD;  Location: [facility];  Service: Gastroenterology;;   BRONCHIAL BIOPSY  11/17/2020   Procedure: BRONCHIAL BIOPSIES;  Surgeon: Laurin Coder, MD;  Location: [facility];  Service: Endoscopy;;   BRONCHIAL BRUSHINGS  11/17/2020   Procedure: BRONCHIAL BRUSHINGS;  Surgeon: Laurin Coder, MD;  Location: [facility];  Service: Endoscopy;;   BRONCHIAL WASHINGS  11/17/2020   Procedure: BRONCHIAL WASHINGS;  Surgeon: Laurin Coder, MD;  Location: [facility];  Service: Endoscopy;;   COLONOSCOPY WITH PROPOFOL N/A 01/09/2020   Procedure: COLONOSCOPY WITH PROPOFOL;  Surgeon: Otis Brace, MD;  Location: [facility];  Service: Gastroenterology;  Laterality: N/A;   ESOPHAGOGASTRODUODENOSCOPY (EGD) WITH PROPOFOL N/A 01/09/2020   Procedure: ESOPHAGOGASTRODUODENOSCOPY (EGD) WITH PROPOFOL;  Surgeon: Otis Brace, MD;  Location: [facility];  Service: Gastroenterology;  Laterality: N/A;   EYE  SURGERY Bilateral    2016   HARDWARE REVISION  03/12/2019   REVISION OF HARDWARE THORACIC TEN-THORACIC ELEVEN WITH APPLICATION OF ADDITIONAL RODS AND ROD SLEEVES THORACIC EIGHT-THORACIC TWELVE 12-LUMBAR ONE (N/A )   HEMOSTASIS CONTROL  11/17/2020   Procedure: HEMOSTASIS CONTROL;  Surgeon: Laurin Coder, MD;  Location: [facility];  Service: Endoscopy;;   IR US GUIDE BX ASP/DRAIN  11/13/2020   LAMINECTOMY  02/12/2019   DECOMPRESSIVE LAMINECTOMY THORACIC NINE-THORACIC TEN AND THORACIC TEN-THORACIC ELEVEN, EXTENSION OF THORACOLUMBAR FUSION FROM THORACIC TEN TO THORACIC FIVE, LOCAL BONE GRAFT, ALLOGRAFT AND VIVIGEN (N/A)   POLYPECTOMY  01/09/2020   Procedure: POLYPECTOMY;  Surgeon: Otis Brace, MD;  Location: [facility];  Service: Gastroenterology;;   REMOVAL OF RODS AND PEDICLE SCREWS T5 AND T6, EXPLORATION OF FUSION, BIOPSY TRANSPEDICULAR T5 AND T6 (N/A )  09/14/2020   SPINAL FUSION N/A 02/12/2019   Procedure: DECOMPRESSIVE LAMINECTOMY THORACIC NINE-THORACIC TEN AND THORACIC TEN-THORACIC ELEVEN, EXTENSION OF THORACOLUMBAR FUSION FROM THORACIC TEN TO THORACIC FIVE, LOCAL BONE GRAFT, ALLOGRAFT AND VIVIGEN;  Surgeon: Jessy Oto, MD;  Location: [facility];  Service: Orthopedics;  Laterality: N/A;  DECOMPRESSIVE LAMINECTOMY THORACIC NINE-THORACIC TEN AND THORACIC TEN-THORACIC ELEVEN, EXTENSION OF TH   SPINAL FUSION N/A 03/12/2019   Procedure: REVISION OF HARDWARE THORACIC TEN-THORACIC ELEVEN WITH APPLICATION OF ADDITIONAL RODS AND ROD SLEEVES THORACIC EIGHT-THORACIC TWELVE 12-LUMBAR ONE;  Surgeon: Jessy Oto, MD;  Location: [facility];  Service: Orthopedics;  Laterality: N/A;   TONSILLECTOMY     as child   torn rotator cuff  2010   right   VIDEO BRONCHOSCOPY N/A 11/17/2020   Procedure: VIDEO BRONCHOSCOPY WITH FLUORO;  Surgeon: Laurin Coder, MD;  Location: [facility];  Service: Endoscopy;  Laterality: N/A;   Social History   Occupational History   Not on file  Tobacco Use   Smoking  status: Former    Packs/day: 1.00    Years: 20.00    Pack years: 20.00    Types: Cigarettes    Quit date: 07/18/1989    Years since quitting: 32.1   Smokeless tobacco: Never  Vaping Use  Vaping Use: Never used  Substance and Sexual Activity   Alcohol use: Not Currently   Drug use: No   Sexual activity: Yes    Comment: lives with wife, still working, no dietary restrictions, continues to exercise intermittently

## 2021-09-10 ENCOUNTER — Encounter: Payer: Self-pay | Admitting: Cardiology

## 2021-09-10 ENCOUNTER — Ambulatory Visit: Payer: Medicare Other | Admitting: Cardiology

## 2021-09-10 ENCOUNTER — Ambulatory Visit (INDEPENDENT_AMBULATORY_CARE_PROVIDER_SITE_OTHER): Payer: Medicare Other

## 2021-09-10 VITALS — BP 124/68 | HR 85 | Ht 70.0 in | Wt 168.0 lb

## 2021-09-10 DIAGNOSIS — E782 Mixed hyperlipidemia: Secondary | ICD-10-CM

## 2021-09-10 DIAGNOSIS — E0800 Diabetes mellitus due to underlying condition with hyperosmolarity without nonketotic hyperglycemic-hyperosmolar coma (NKHHC): Secondary | ICD-10-CM

## 2021-09-10 DIAGNOSIS — Z794 Long term (current) use of insulin: Secondary | ICD-10-CM | POA: Diagnosis not present

## 2021-09-10 DIAGNOSIS — I4891 Unspecified atrial fibrillation: Secondary | ICD-10-CM | POA: Diagnosis not present

## 2021-09-10 DIAGNOSIS — E785 Hyperlipidemia, unspecified: Secondary | ICD-10-CM | POA: Insufficient documentation

## 2021-09-10 DIAGNOSIS — Z7901 Long term (current) use of anticoagulants: Secondary | ICD-10-CM | POA: Insufficient documentation

## 2021-09-10 LAB — POCT INR: INR: 1 — AB (ref 2.0–3.0)

## 2021-09-10 NOTE — Progress Notes (Signed)
Cardiology Office Note:    Date:  09/10/2021   ID:  Luis Brown, DOB April 28, 1946, MRN [id number]  PCP:  Mackie Pai, PA-C  Cardiologist:  Berniece Salines, DO  Electrophysiologist:  None   Referring MD: Mackie Pai, PA-C   " I am doing fine"  History of Present Illness:    Luis Brown is a 76 y.o. male with a hx of paroxysmal atrial fibrillation on digoxin and Xarelto, hypertension, diabetes mellitus, hyperlipidemia is here today for follow-up visit.   I saw the patient on January 20, 2021 at that time he was in atrial fibrillation with rapid ventricular rate I started him on Cardizem 120 mg daily, in the meantime he had some problems with the Cardizem we therefore stopped that and transition the patient to digoxin.  He will his ZIO monitor which showed evidence of 3% A. fib burden.   His last visit with me was August 2022.  At that time I set the patient up for evaluation by EP to consider atrial fibrillation ablation.  He was on Cardizem and Xarelto.  In the interim he had to be transitioned off Xarelto because of DVT while on Xarelto.  He was placed on Lovenox.  Recently the patient contacted the office as he was having significant bruising from the Lovenox.  We will transition the patient to Coumadin. Today he was seen in our pulmonary clinic to establish care.  He is happy with transitioning to [place].  He understands the dietary restrictions but he does not think this is going to be a problem. He also is scheduled for an ablation in March.  He is looking forward to this.  He has some questions today.  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   Esophageal reflux 02/10/2015   Fatigue    HTN (hypertension)    Hyperglycemia    preDM    Hyperlipidemia    Insomnia    Low back pain radiating to both legs 01/16/2017   Measles as a child   Overweight(278.02)    Peripheral neuropathy 08/07/2019   Personal history of colonic polyps 10/27/2012   Follows with [facility]   Pneumonia    " walking"   Preventative health care    Testosterone deficiency 05/23/2012   Wears glasses     Past Surgical History:  Procedure Laterality Date   BACK SURGERY  2012 and 1994   Dr Margret Chance, screws and cage in low back   BIOPSY  01/09/2020   Procedure: BIOPSY;  Surgeon: Otis Brace, MD;  Location: [facility];  Service: Gastroenterology;;   BRONCHIAL BIOPSY  11/17/2020   Procedure: BRONCHIAL BIOPSIES;  Surgeon: Laurin Coder, MD;  Location: [facility];  Service: Endoscopy;;   BRONCHIAL BRUSHINGS  11/17/2020   Procedure: BRONCHIAL BRUSHINGS;  Surgeon: Laurin Coder, MD;  [facility]: [facility];  Service: Endoscopy;;   BRONCHIAL WASHINGS  11/17/2020   Procedure: BRONCHIAL WASHINGS;  Surgeon: Laurin Coder, MD;  [facility]: [facility];  Service: Endoscopy;;   COLONOSCOPY WITH PROPOFOL N/A 01/09/2020   Procedure: COLONOSCOPY WITH PROPOFOL;  Surgeon: Otis Brace, MD;  [facility]: [facility];  Service: Gastroenterology;  Laterality: N/A;   ESOPHAGOGASTRODUODENOSCOPY (EGD) WITH PROPOFOL N/A 01/09/2020   Procedure: ESOPHAGOGASTRODUODENOSCOPY (EGD) WITH PROPOFOL;  Surgeon: Otis Brace, MD;  [facility];  Service: Gastroenterology;  Laterality: N/A;   EYE SURGERY Bilateral    2016   HARDWARE REVISION  03/12/2019   REVISION OF HARDWARE THORACIC TEN-THORACIC ELEVEN WITH APPLICATION OF ADDITIONAL RODS AND ROD SLEEVES THORACIC EIGHT-THORACIC TWELVE 12-LUMBAR ONE (N/A )   HEMOSTASIS CONTROL  11/17/2020    Procedure: HEMOSTASIS CONTROL;  Surgeon: Laurin Coder, MD;  [facility]: [facility];  Service: Endoscopy;;   IR US GUIDE BX ASP/DRAIN  11/13/2020   LAMINECTOMY  02/12/2019   DECOMPRESSIVE LAMINECTOMY THORACIC NINE-THORACIC TEN AND THORACIC TEN-THORACIC ELEVEN, EXTENSION OF THORACOLUMBAR FUSION FROM THORACIC TEN TO THORACIC FIVE, LOCAL BONE GRAFT, ALLOGRAFT AND VIVIGEN (N/A)   POLYPECTOMY  01/09/2020   Procedure: POLYPECTOMY;  Surgeon: Otis Brace, MD;  [facility]: [facility];  Service: Gastroenterology;;   REMOVAL OF RODS AND PEDICLE SCREWS T5 AND T6, EXPLORATION OF FUSION, BIOPSY TRANSPEDICULAR T5 AND T6 (N/A )  09/14/2020   SPINAL FUSION N/A 02/12/2019   Procedure: DECOMPRESSIVE LAMINECTOMY THORACIC NINE-THORACIC TEN AND THORACIC TEN-THORACIC ELEVEN, EXTENSION OF THORACOLUMBAR FUSION FROM THORACIC TEN TO THORACIC FIVE, LOCAL BONE GRAFT, ALLOGRAFT AND VIVIGEN;  Surgeon: Jessy Oto, MD;  Location: [place];  Service: Orthopedics;  Laterality: N/A;  DECOMPRESSIVE LAMINECTOMY THORACIC NINE-THORACIC TEN AND THORACIC TEN-THORACIC ELEVEN, EXTENSION OF TH   SPINAL FUSION N/A 03/12/2019   Procedure: REVISION OF HARDWARE THORACIC TEN-THORACIC ELEVEN WITH APPLICATION OF ADDITIONAL RODS AND ROD SLEEVES THORACIC EIGHT-THORACIC TWELVE 12-LUMBAR ONE;  Surgeon: Jessy Oto, MD;  Location: [place];  Service: Orthopedics;  Laterality: N/A;   TONSILLECTOMY     as child   torn rotator cuff  2010   right   VIDEO BRONCHOSCOPY N/A 11/17/2020   Procedure: VIDEO BRONCHOSCOPY WITH FLUORO;  Surgeon: Laurin Coder, MD;  [facility]: [facility];  Service: Endoscopy;  Laterality: N/A;    Current Medications: Current Meds  Medication Sig   albuterol (PROVENTIL HFA) 108 (90 Base) MCG/ACT inhaler TAKE 2 PUFFS BY MOUTH EVERY 6 HOURS AS NEEDED FOR WHEEZE OR SHORTNESS OF BREATH   atorvastatin (LIPITOR) 10 MG tablet TAKE 1 TABLET BY MOUTH EVERY DAY   DALVANCE 500 MG SOLR Inject 500 mg into the vein every 14  (fourteen) days. Pt states this is a every 2 week antibiotic. Could not state how long therapy should be.   digoxin (LANOXIN) 0.125 MG tablet Take 1 tablet (0.125 mg total) by mouth daily.   enoxaparin (LOVENOX) 80 MG/0.8ML injection Inject 0.8 mLs (80 mg total) into the skin every 12 (twelve) hours.   glucose blood (CONTOUR NEXT TEST) test strip Check blood sugar once daily   insulin glargine (LANTUS) 100 UNIT/ML Solostar Pen Inject 10 Units into the skin daily.   Insulin Pen Needle (PEN NEEDLES 3/16") 31G X 5 MM MISC Use with Lantus   metFORMIN (GLUCOPHAGE) 1000 MG tablet TAKE 1 TABLET (1,000 MG TOTAL) BY MOUTH 2 (TWO) TIMES DAILY WITH A MEAL.   mupirocin ointment (BACTROBAN) 2 % Apply 1 application topically 2 (two) times daily.   oxyCODONE-acetaminophen (PERCOCET/ROXICET) 5-325 MG tablet Take 1 tablet by mouth every 4 (four) hours as needed for moderate pain.   pantoprazole (PROTONIX) 40 MG tablet TAKE 1 TABLET BY MOUTH  TWICE A DAY BEFORE A MEAL (Patient taking differently: 40 mg 2 (two) times daily before a meal.)   predniSONE (DELTASONE) 10 MG tablet Take 1 tablet (10 mg total) by mouth daily with breakfast.   pregabalin (LYRICA) 75 MG capsule TAKE 1 CAPSULE BY MOUTH TWICE A DAY (Patient taking differently: Take 75 mg by mouth 2 (two) times daily.)   sitaGLIPtin (JANUVIA) 50 MG tablet Take 1 tablet (50 mg total) by mouth daily.   STIOLTO RESPIMAT 2.5-2.5 MCG/ACT AERS Inhale 2 puffs into the lungs daily.   tamsulosin (FLOMAX) 0.4 MG CAPS capsule Take 2 capsules (0.8 mg total) by mouth daily. (Patient taking differently: Take 0.8 mg by mouth at bedtime.)   tiZANidine (ZANAFLEX) 4 MG tablet TAKE 1.5 TABLETS (6 MG TOTAL) BY MOUTH EVERY 6 (SIX) HOURS AS NEEDED FOR MUSCLE SPASMS.   venlafaxine XR (EFFEXOR-XR) 150 MG 24 hr capsule TAKE 1 CAPSULE BY MOUTH DAILY WITH BREAKFAST. (Patient taking differently: 150 mg daily with breakfast.)   warfarin (COUMADIN) 5 MG tablet Take 1 tablet (5 mg total) by  mouth daily.   zolpidem (AMBIEN) 5 MG tablet Take 1 tablet (5 mg total) by mouth at bedtime as needed. for sleep   Current Facility-Administered Medications for the 09/10/21 encounter (Office Visit) with Berniece Salines, DO  Medication   testosterone cypionate (DEPOTESTOSTERONE CYPIONATE) injection 200 mg     Allergies:   Daptomycin and Diltiazem   Social History   Socioeconomic History   Marital status: Married    Spouse name: Not on file   Number of children: Not on file   Years of education: Not on file   Highest education level: Not on file  Occupational History   Not on file  Tobacco Use   Smoking status: Former    Packs/day: 1.00    Years: 20.00    Pack years: 20.00    Types: Cigarettes    Quit date: 07/18/1989    Years since quitting: 32.1   Smokeless tobacco: Never  Vaping Use   Vaping Use: Never used  Substance and Sexual Activity   Alcohol use: Not Currently   Drug use: No   Sexual activity: Yes    Comment: lives with wife, still working, no dietary restrictions, continues to exercise intermittently  Other Topics Concern   Not on file  Social History Narrative   Not on file   Social Determinants of Health   Financial Resource Strain: Low Risk    Difficulty of Paying Living Expenses: Not hard at all  Food Insecurity: No Food Insecurity   Worried About Charity fundraiser in the Last Year: Never true   Arboriculturist in the Last Year: Never true  Transportation Needs: No Transportation Needs   Lack of Transportation (Medical): No   Lack of Transportation (Non-Medical): No  Physical Activity: Sufficiently Active   Days of Exercise per Week: 7 days   Minutes of Exercise per Session: 30 min  Stress: No Stress Concern Present   Feeling of Stress : Not at all  Social Connections: Moderately Integrated   Frequency of Communication with Friends and Family: More than three times a week   Frequency of Social Gatherings with Friends and Family: More than three times  a week   Attends Religious Services: More than 4 times per year   Active Member of [employer] or Organizations: No   Attends Archivist Meetings: Never   Marital Status: Married     Family History: The  patient's family history includes COPD in his father; Cancer (age of onset: 45) in his mother; Diabetes in his mother; Emphysema in his father; Heart disease in his daughter, father, maternal grandfather, and paternal grandfather; Hyperlipidemia in his sister; Hypertension in his maternal grandmother, mother, and sister; Osteoporosis in his sister; Scoliosis in his maternal grandmother and sister; Stroke (age of onset: 10) in his father.  ROS:   Review of Systems  Constitution: Negative for decreased appetite, fever and weight gain.  HENT: Negative for congestion, ear discharge, hoarse voice and sore throat.   Eyes: Negative for discharge, redness, vision loss in right eye and visual halos.  Cardiovascular: Negative for chest pain, dyspnea on exertion, leg swelling, orthopnea and palpitations.  Respiratory: Negative for cough, hemoptysis, shortness of breath and snoring.   Endocrine: Negative for heat intolerance and polyphagia.  Hematologic/Lymphatic: Negative for bleeding problem. Does not bruise/bleed easily.  Skin: Negative for flushing, nail changes, rash and suspicious lesions.  Musculoskeletal: Negative for arthritis, joint pain, muscle cramps, myalgias, neck pain and stiffness.  Gastrointestinal: Negative for abdominal pain, bowel incontinence, diarrhea and excessive appetite.  Genitourinary: Negative for decreased libido, genital sores and incomplete emptying.  Neurological: Negative for brief paralysis, focal weakness, headaches and loss of balance.  Psychiatric/Behavioral: Negative for altered mental status, depression and suicidal ideas.  Allergic/Immunologic: Negative for HIV exposure and persistent infections.    EKGs/Labs/Other Studies Reviewed:    The following  studies were reviewed today:   EKG: None today  Zio monitor  Patch Wear Time:  12 days and 20 hours January 20, 2021 Indication: Paroxysmal atrial fibrillation   Patient had a min HR of 47 bpm, max HR of 227 bpm, and avg HR of 77 bpm.    Predominant underlying rhythm was Sinus Rhythm.    Atrial Fibrillation/Flutter occurred (3% burden), ranging from 72-227 bpm (avg of 143 bpm), the longest lasting 1 hour 10 mins with an average rate of 134 bpm.   Premature atrial complexes  were occasional (1.1%, 15374). Premature ventricular complexes were  rare (<1.0%).     Symptoms were associated with atrial fibrillation.   No AV block, no ventricular tachycardia noted.   Conclusion: This study is remarkable for symptomatic paroxysmal atrial fibrillation and occasional premature atrial complexes.     Transthoracic echocardiogram November 11, 2020 IMPRESSIONS     1. Left ventricular ejection fraction, by estimation, is 60 to 65%. The  left ventricle has normal function. The left ventricle has no regional  wall motion abnormalities. Left ventricular diastolic parameters were  normal.   2. Right ventricular systolic function is normal. The right ventricular  size is mildly enlarged.   3. The mitral valve is normal in structure. No evidence of mitral valve  regurgitation.   4. The aortic valve was not well visualized. Aortic valve regurgitation  is not visualized. No aortic stenosis is present.   5. The inferior vena cava is normal in size with greater than 50%  respiratory variability, suggesting right atrial pressure of 3 mmHg.   Conclusion(s)/Recommendation(s): No vegetation seen. If high clinical  suspicion for endocarditis consider TEE.   FINDINGS   Left Ventricle: Left ventricular ejection fraction, by estimation, is 60  to 65%. The left ventricle has normal function. The left ventricle has no  regional wall motion abnormalities. The left ventricular internal cavity  size was normal  in size. There is   no left ventricular hypertrophy. Left ventricular diastolic parameters  were normal.   Right Ventricle: The  right ventricular size is mildly enlarged. No  increase in right ventricular wall thickness. Right ventricular systolic  function is normal.   Left Atrium: Left atrial size was normal in size.   Right Atrium: Right atrial size was normal in size.   Pericardium: Trivial pericardial effusion is present.   Mitral Valve: The mitral valve is normal in structure. No evidence of  mitral valve regurgitation.   Tricuspid Valve: The tricuspid valve is normal in structure. Tricuspid  valve regurgitation is not demonstrated.   Aortic Valve: The aortic valve was not well visualized. Aortic valve  regurgitation is not visualized. No aortic stenosis is present.   Pulmonic Valve: The pulmonic valve was not well visualized. Pulmonic valve  regurgitation is not visualized.   Aorta: The aortic root is normal in size and structure.   Venous: The inferior vena cava is normal in size with greater than 50%  respiratory variability, suggesting right atrial pressure of 3 mmHg.   IAS/Shunts: The interatrial septum was not well visualized.   Recent Labs: 07/16/2021: TSH 1.816 07/17/2021: B Natriuretic Peptide 82.0 07/22/2021: Magnesium 2.2 08/16/2021: ALT 11; BUN 12; Creatinine, Ser 0.74; Hemoglobin 12.9; Platelets 318.0; Potassium 4.9; Sodium 126  Recent Lipid Panel    Component Value Date/Time   CHOL 112 07/01/2020 0857   TRIG 66.0 07/01/2020 0857   HDL 56.10 07/01/2020 0857   CHOLHDL 2 07/01/2020 0857   VLDL 13.2 07/01/2020 0857   LDLCALC 42 07/01/2020 0857   LDLDIRECT 64.0 10/30/2017 1502    Physical Exam:    VS:  BP 124/68    Pulse 85    Ht 5\' 10"  (1.778 m)    Wt 168 lb (76.2 kg)    SpO2 96%    BMI 24.11 kg/m     Wt Readings from Last 3 Encounters:  09/10/21 168 lb (76.2 kg)  08/16/21 167 lb 12.8 oz (76.1 kg)  08/12/21 165 lb (74.8 kg)     GEN: Well  nourished, well developed in no acute distress HEENT: Normal NECK: No JVD; No carotid bruits LYMPHATICS: No lymphadenopathy CARDIAC: S1S2 noted,RRR, no murmurs, rubs, gallops RESPIRATORY:  Clear to auscultation without rales, wheezing or rhonchi  ABDOMEN: Soft, non-tender, non-distended, +bowel sounds, no guarding. EXTREMITIES: No edema, No cyanosis, no clubbing MUSCULOSKELETAL:  No deformity  SKIN: Warm and dry NEUROLOGIC:  Alert and oriented x 3, non-focal PSYCHIATRIC:  Normal affect, good insight  ASSESSMENT:    1. Atrial fibrillation, unspecified type ([place])   2. Diabetes mellitus due to underlying condition with hyperosmolarity without coma, with long-term current use of insulin ([place])   3. Mixed hyperlipidemia    PLAN:     1.  Paroxysmal atrial fibrillation-continue patient on Cardizem.  He has been started on warfarin and he will follow-up with our Coumadin clinic.  He has schedule ablation coming up in March.  He is scheduled follow-up with EP for any further specifics for his procedure.  2.  Hypertension-blood pressure is acceptable, continue with current antihypertensive regimen.   3.  Hyperlipidemia - continue with current statin medication.  4.  Diabetes mellitus-this is being managed by his primary care doctor.  No adjustments for antidiabetic medications were made today.   The patient is in agreement with the above plan. The patient left the office in stable condition.  The patient will follow up in 6 months or sooner if needed.  Medication Adjustments/Labs and Tests Ordered: Current medicines are reviewed at length with the patient today.  Concerns regarding medicines  are outlined above.  No orders of the defined types were placed in this encounter.  No orders of the defined types were placed in this encounter.   Patient Instructions  Medication Instructions:  Your physician recommends that you continue on your current medications as directed. Please refer to  the Current Medication list given to you today.  *If you need a refill on your cardiac medications before your next appointment, please call your pharmacy*   Lab Work: NONE. If you have labs (blood work) drawn today and your tests are completely normal, you will receive your results only by: [place] (if you have MyChart) OR A paper copy in the mail If you have any lab test that is abnormal or we need to change your treatment, we will call you to review the results.   Testing/Procedures: NONE.   Follow-Up: At [facility], you and your health needs are our priority.  As part of our continuing mission to provide you with exceptional heart care, we have created designated Provider Care Teams.  These Care Teams include your primary Cardiologist (physician) and Advanced Practice Providers (APPs -  Physician Assistants and Nurse Practitioners) who all work together to provide you with the care you need, when you need it.  We recommend signing up for the patient portal called "MyChart".  Sign up information is provided on this After Visit Summary.  MyChart is used to connect with patients for Virtual Visits (Telemedicine).  Patients are able to view lab/test results, encounter notes, upcoming appointments, etc.  Non-urgent messages can be sent to your provider as well.   To learn more about what you can do with MyChart, go to [email or web address].    Your next appointment:   6 month(s)  The format for your next appointment:   In Person  Provider:   Berniece Salines, DO       Adopting a Healthy Lifestyle.  Know what a healthy weight is for you (roughly BMI <25) and aim to maintain this   Aim for 7+ servings of fruits and vegetables daily   65-80+ fluid ounces of water or unsweet tea for healthy kidneys   Limit to max 1 drink of alcohol per day; avoid smoking/tobacco   Limit animal fats in diet for cholesterol and heart health - choose grass fed whenever available   Avoid  highly processed foods, and foods high in saturated/trans fats   Aim for low stress - take time to unwind and care for your mental health   Aim for 150 min of moderate intensity exercise weekly for heart health, and weights twice weekly for bone health   Aim for 7-9 hours of sleep daily   When it comes to diets, agreement about the perfect plan isnt easy to find, even among the experts. Experts at the [employer] developed an idea known as the Healthy Eating Plate. Just imagine a plate divided into logical, healthy portions.   The emphasis is on diet quality:   Load up on vegetables and fruits - one-half of your plate: Aim for color and variety, and remember that potatoes dont count.   Go for whole grains - one-quarter of your plate: Whole wheat, barley, wheat berries, quinoa, oats, brown rice, and foods made with them. If you want pasta, go with whole wheat pasta.   Protein power - one-quarter of your plate: Fish, chicken, beans, and nuts are all healthy, versatile protein sources. Limit red meat.   The diet, however, does  go beyond the plate, offering a few other suggestions.   Use healthy plant oils, such as olive, canola, soy, corn, sunflower and peanut. Check the labels, and avoid partially hydrogenated oil, which have unhealthy trans fats.   If youre thirsty, drink water. Coffee and tea are good in moderation, but skip sugary drinks and limit milk and dairy products to one or two daily servings.   The type of carbohydrate in the diet is more important than the amount. Some sources of carbohydrates, such as vegetables, fruits, whole grains, and beans-are healthier than others.   Finally, stay active  Signed, Berniece Salines, DO  09/10/2021 9:26 PM    [facility]

## 2021-09-10 NOTE — Patient Instructions (Signed)
Take 2 tablets today and tomorrow and then continue 1 tablet Daily.  Continue Lovenox injections.  INR in 1 week.  A full discussion of the nature of anticoagulants has been carried out.  A benefit risk analysis has been presented to the patient, so that they understand the justification for choosing anticoagulation at this time. The need for frequent and regular monitoring, precise dosage adjustment and compliance is stressed.  Side effects of potential bleeding are discussed.  The patient should avoid any OTC items containing aspirin or ibuprofen, and should avoid great swings in general diet.  Avoid alcohol consumption.  Call if any signs of abnormal bleeding.  [phone]

## 2021-09-10 NOTE — Patient Instructions (Signed)
Medication Instructions:  Your physician recommends that you continue on your current medications as directed. Please refer to the Current Medication list given to you today.  *If you need a refill on your cardiac medications before your next appointment, please call your pharmacy*   Lab Work: NONE If you have labs (blood work) drawn today and your tests are completely normal, you will receive your results only by: MyChart Message (if you have MyChart) OR A paper copy in the mail If you have any lab test that is abnormal or we need to change your treatment, we will call you to review the results.   Testing/Procedures: NONE   Follow-Up: At [facility], you and your health needs are our priority.  As part of our continuing mission to provide you with exceptional heart care, we have created designated Provider Care Teams.  These Care Teams include your primary Cardiologist (physician) and Advanced Practice Providers (APPs -  Physician Assistants and Nurse Practitioners) who all work together to provide you with the care you need, when you need it.  We recommend signing up for the patient portal called "MyChart".  Sign up information is provided on this After Visit Summary.  MyChart is used to connect with patients for Virtual Visits (Telemedicine).  Patients are able to view lab/test results, encounter notes, upcoming appointments, etc.  Non-urgent messages can be sent to your provider as well.   To learn more about what you can do with MyChart, go to [email or web address].    Your next appointment:   6 month(s)  The format for your next appointment:   In Person  Provider:   Kardie Tobb, DO   

## 2021-09-13 ENCOUNTER — Encounter: Payer: Self-pay | Admitting: *Deleted

## 2021-09-15 ENCOUNTER — Other Ambulatory Visit: Payer: Self-pay

## 2021-09-15 ENCOUNTER — Ambulatory Visit (INDEPENDENT_AMBULATORY_CARE_PROVIDER_SITE_OTHER): Payer: Medicare Other

## 2021-09-15 DIAGNOSIS — I480 Paroxysmal atrial fibrillation: Secondary | ICD-10-CM | POA: Diagnosis not present

## 2021-09-15 DIAGNOSIS — Z7901 Long term (current) use of anticoagulants: Secondary | ICD-10-CM

## 2021-09-15 LAB — POCT INR: INR: 1.8 — AB (ref 2.0–3.0)

## 2021-09-15 NOTE — Patient Instructions (Signed)
Take 2 tablets today only and then increase to 1 tablet Daily, except 1.5 tablets Monday and Friday. Stop Lovenox injections.  INR in 1 week. ? ?[phone] ?

## 2021-09-21 ENCOUNTER — Other Ambulatory Visit: Payer: Self-pay

## 2021-09-21 ENCOUNTER — Encounter: Payer: Self-pay | Admitting: Internal Medicine

## 2021-09-21 ENCOUNTER — Ambulatory Visit: Payer: Medicare Other | Admitting: Internal Medicine

## 2021-09-21 VITALS — BP 105/70 | HR 87 | Temp 97.5°F | Wt 172.0 lb

## 2021-09-21 DIAGNOSIS — R0602 Shortness of breath: Secondary | ICD-10-CM | POA: Diagnosis not present

## 2021-09-21 DIAGNOSIS — M4624 Osteomyelitis of vertebra, thoracic region: Secondary | ICD-10-CM | POA: Diagnosis not present

## 2021-09-21 DIAGNOSIS — J9601 Acute respiratory failure with hypoxia: Secondary | ICD-10-CM | POA: Diagnosis not present

## 2021-09-21 NOTE — Progress Notes (Signed)
?  ? ? ? ? ?[facility] ? ?CHIEF COMPLAINT:   ? ?Follow up for back infection ? ?SUBJECTIVE:   ? ?Luis Brown AGE is a 76 y.o. male with PMHx as below who presents to the clinic for back infection.  ? ?Luis Brown presents today for routine follow-up.  He has a long history of spinal infection as noted in my note from 03/17/2021.  He was last seen by me on 06/23/21.   ? ?He completed 10 days of linezolid + 8 weeks of tedizolid for his spinal infection and was then started on dalbavancin infusions every 2 weeks as a suppressive regimen in November 2022.   ? ?Thus far, he seems to be tolerating the dalbavancin infusions without worsening back pain or fevers.  He does have chronic pain that he is currently managing with his home pain medication regimen.   ? ?Please see A&P for the details of today's visit and status of the patient's medical problems.  ? ?Patient's Medications  ?New Prescriptions  ? No medications on file  ?Previous Medications  ? ALBUTEROL (PROVENTIL HFA) 108 (90 BASE) MCG/ACT INHALER    TAKE 2 PUFFS BY MOUTH EVERY 6 HOURS AS NEEDED FOR WHEEZE OR SHORTNESS OF BREATH  ? ATORVASTATIN (LIPITOR) 10 MG TABLET    TAKE 1 TABLET BY MOUTH EVERY DAY  ? DALVANCE 500 MG SOLR    Inject 500 mg into the vein every 14 (fourteen) days. Pt states this is a every 2 week antibiotic. Could not state how long therapy should be.  ? DIGOXIN (LANOXIN) 0.125 MG TABLET    Take 1 tablet (0.125 mg total) by mouth daily.  ? ENOXAPARIN (LOVENOX) 80 MG/0.8ML INJECTION    Inject 0.8 mLs (80 mg total) into the skin every 12 (twelve) hours.  ? GLUCOSE BLOOD (CONTOUR NEXT TEST) TEST STRIP    Check blood sugar once daily  ? INSULIN GLARGINE (LANTUS) 100 UNIT/ML SOLOSTAR PEN    Inject 10 Units into the skin daily.  ? INSULIN PEN NEEDLE (PEN NEEDLES 3/16") 31G X 5 MM MISC    Use with Lantus  ? METFORMIN (GLUCOPHAGE) 1000 MG TABLET    TAKE 1 TABLET (1,000 MG TOTAL) BY MOUTH 2 (TWO) TIMES DAILY WITH A MEAL.  ? MUPIROCIN  OINTMENT (BACTROBAN) 2 %    Apply 1 application topically 2 (two) times daily.  ? OXYCODONE-ACETAMINOPHEN (PERCOCET/ROXICET) 5-325 MG TABLET    Take 1 tablet by mouth every 4 (four) hours as needed for moderate pain.  ? PANTOPRAZOLE (PROTONIX) 40 MG TABLET    TAKE 1 TABLET BY MOUTH TWICE A DAY BEFORE A MEAL  ? PREDNISONE (DELTASONE) 10 MG TABLET    Take 1 tablet (10 mg total) by mouth daily with breakfast.  ? PREGABALIN (LYRICA) 75 MG CAPSULE    TAKE 1 CAPSULE BY MOUTH TWICE A DAY  ? SITAGLIPTIN (JANUVIA) 50 MG TABLET    Take 1 tablet (50 mg total) by mouth daily.  ? STIOLTO RESPIMAT 2.5-2.5 MCG/ACT AERS    Inhale 2 puffs into the lungs daily.  ? TAMSULOSIN (FLOMAX) 0.4 MG CAPS CAPSULE    Take 2 capsules (0.8 mg total) by mouth daily.  ? TIZANIDINE (ZANAFLEX) 4 MG TABLET    TAKE 1.5 TABLETS (6 MG TOTAL) BY MOUTH EVERY 6 (SIX) HOURS AS NEEDED FOR MUSCLE SPASMS.  ? VENLAFAXINE XR (EFFEXOR-XR) 150 MG 24 HR CAPSULE    TAKE 1 CAPSULE BY MOUTH DAILY WITH BREAKFAST.  ? WARFARIN (  COUMADIN) 5 MG TABLET    Take 1 tablet (5 mg total) by mouth daily.  ? ZOLPIDEM (AMBIEN) 5 MG TABLET    Take 1 tablet (5 mg total) by mouth at bedtime as needed. for sleep  ?Modified Medications  ? No medications on file  ?Discontinued Medications  ? No medications on file  ?   ? ?Past Medical History:  ?Diagnosis Date  ? Acute pharyngitis 10/21/2013  ? Allergy   ? grass and pollen  ? Anxiety and depression 10/25/2011  ? BPH (benign prostatic hyperplasia) 04/23/2012  ? Chicken pox as a child  ? DDD (degenerative disc disease)   ? cervical responds to steroid injections and low back required surgery  ? DDD (degenerative disc disease), lumbosacral   ? Diabetes mellitus   ? pre  ? Dyspnea   ? ED (erectile dysfunction) 04/23/2012  ? Elevated BP   ? 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 Esophageal reflux 02/10/2015  ? Fatigue   ? HTN (hypertension)   ? Hyperglycemia   ? preDM   ? Hyperlipidemia   ? Insomnia   ? Low back pain radiating to both legs 01/16/2017  ? Measles  as a child  ? Overweight(278.02)   ? Peripheral neuropathy 08/07/2019  ? Personal history of colonic polyps 10/27/2012  ? Follows with [facility]  ? Pneumonia   ? " walking"  ? Preventative health care   ? Testosterone deficiency 05/23/2012  ? Wears glasses   ? ? ?Social History  ? ?Tobacco Use  ? Smoking status: Former  ?  Packs/day: 1.00  ?  Years: 20.00  ?  Pack years: 20.00  ?  Types: Cigarettes  ?  Quit date: 07/18/1989  ?  Years since quitting: 32.2  ? Smokeless tobacco: Never  ?Vaping Use  ? Vaping Use: Never used  ?Substance Use Topics  ? Alcohol use: Not Currently  ? Drug use: No  ? ? ?Family History  ?Problem Relation Age of Onset  ? Hypertension Mother   ? Diabetes Mother   ?     type 2  ? Cancer Mother 55  ?     breast in remission  ? Emphysema Father   ?     smoker  ? COPD Father   ?     smoker  ? Stroke Father 83  ?     mini  ? Heart disease Father   ? Hypertension Sister   ? Hyperlipidemia Sister   ? Scoliosis Sister   ? Osteoporosis Sister   ? Hypertension Maternal Grandmother   ? Scoliosis Maternal Grandmother   ? Heart disease Maternal Grandfather   ? Heart disease Paternal Grandfather   ?     smoker  ? Heart disease Daughter   ? ? ?Allergies  ?Allergen Reactions  ? Daptomycin   ?  Drug induced pneumonia   ? Diltiazem Other (See Comments)  ?  Passed out  ? ? ?Review of Systems  ?Constitutional: Negative.   ?Musculoskeletal:  Positive for back pain.  ? ? ?OBJECTIVE:   ? ?Vitals:  ? 09/21/21 1116  ?BP: 105/70  ?Pulse: 87  ?Temp: (!) 97.5 ?F (36.4 ?C)  ?TempSrc: Oral  ?SpO2: 97%  ?Weight: 172 lb (78 kg)  ? ?Body mass index is 24.68 kg/m?. ? ?Physical Exam ?Constitutional:   ?   General: He is not in acute distress. ?   Appearance: Normal appearance.  ?HENT:  ?   Head: Normocephalic and atraumatic.  ?Eyes:  ?  Extraocular Movements: Extraocular movements intact.  ?   Conjunctiva/sclera: Conjunctivae normal.  ?Pulmonary:  ?   Effort: Pulmonary effort is normal. No respiratory distress.   ?Musculoskeletal:     ?   General: Normal range of motion.  ?Skin: ?   General: Skin is warm and dry.  ?Neurological:  ?   General: No focal deficit present.  ?   Mental Status: He is alert and oriented to person, place, and time.  ?Psychiatric:     ?   Mood and Affect: Mood normal.     ?   Behavior: Behavior normal.  ? ? ? ?Labs and Microbiology: ?CBC Latest Ref Rng & Units 08/16/2021 08/08/2021 08/07/2021  ?WBC 4.0 - 10.5 K/uL 8.5 10.7(H) 11.6(H)  ?Hemoglobin 13.0 - 17.0 g/dL 12.9(L) 11.8(L) 14.1  ?Hematocrit 39.0 - 52.0 % 38.8(L) 34.9(L) 41.6  ?Platelets 150.0 - 400.0 K/uL 318.0 266 286  ? ?CMP Latest Ref Rng & Units 08/16/2021 08/08/2021 08/07/2021  ?Glucose 70 - 99 mg/dL 399(H) 370(H) 381(H)  ?BUN 6 - 23 mg/dL $Remove'12 18 23  'caSYckv$ ?Creatinine 0.40 - 1.50 mg/dL 0.74 0.69 0.75  ?Sodium 135 - 145 mEq/L 126(L) 128(L) 125(L)  ?Potassium 3.5 - 5.1 mEq/L 4.9 4.2 4.5  ?Chloride 96 - 112 mEq/L 88(L) 93(L) 90(L)  ?CO2 19 - 32 mEq/L $Remove'31 24 24  'tQKUkgu$ ?Calcium 8.4 - 10.5 mg/dL 9.3 8.4(L) 8.8(L)  ?Total Protein 6.0 - 8.3 g/dL 6.3 - 6.4(L)  ?Total Bilirubin 0.2 - 1.2 mg/dL 0.5 - 1.1  ?Alkaline Phos 39 - 117 U/L 55 - 54  ?AST 0 - 37 U/L 15 - 24  ?ALT 0 - 53 U/L 11 - 16  ?  ? ? ? ?ASSESSMENT & PLAN:   ? ?1. 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 ?Patient completed 8 weeks of tedizolid (plus 10 days linezolid) for his complicated spinal infection with hardware as documented in my note from 03/17/2021.  He was started on dalbavancin 1500 mg IV x1 dose then 1000 mg IV every 2 weeks via peripheral IVs from [facility].  This suppressive regimen was initiated on 05/28/2021.  His inflammatory markers have been stable/improved.  Most recent OPAT labs from 09/09/21 = ESR 2, CRP 3.  His back pain is stable and he is overall doing well.  We will continue suppressive dalbavancin for now.  Anticipate a prolonged course of therapy as long as he continues to tolerate likely for 12 months.  Continue labs with every 2-week infusions and follow-up in 3  months. ? ? ? ?Mignon Pine ?[place] ?[facility] ?09/21/2021, 12:02 PM ? ? ? ?

## 2021-09-21 NOTE — Patient Instructions (Signed)
Thank you for coming to see me today. It was a pleasure seeing you. ? ?To Do: ?Continue week every 2 week infusion of antibiotics and labs ?Follow up in 3 months with virtual visit ? ?If you have any questions or concerns, please do not hesitate to call the office at [phone]. ? ?Take Care,  ? ?Jule Ser ? ?

## 2021-09-22 ENCOUNTER — Ambulatory Visit: Payer: Medicare Other

## 2021-09-22 ENCOUNTER — Encounter: Payer: Self-pay | Admitting: Internal Medicine

## 2021-09-22 DIAGNOSIS — G894 Chronic pain syndrome: Secondary | ICD-10-CM | POA: Diagnosis not present

## 2021-09-22 DIAGNOSIS — M150 Primary generalized (osteo)arthritis: Secondary | ICD-10-CM | POA: Diagnosis not present

## 2021-09-22 DIAGNOSIS — M4624 Osteomyelitis of vertebra, thoracic region: Secondary | ICD-10-CM | POA: Diagnosis not present

## 2021-09-22 DIAGNOSIS — I480 Paroxysmal atrial fibrillation: Secondary | ICD-10-CM | POA: Diagnosis not present

## 2021-09-22 DIAGNOSIS — Z7901 Long term (current) use of anticoagulants: Secondary | ICD-10-CM

## 2021-09-22 DIAGNOSIS — M961 Postlaminectomy syndrome, not elsewhere classified: Secondary | ICD-10-CM | POA: Diagnosis not present

## 2021-09-22 LAB — POCT INR: INR: 1.5 — AB (ref 2.0–3.0)

## 2021-09-22 NOTE — Patient Instructions (Signed)
Take 2 tablets today and Thursday only and then increase to 1.5 tablets Daily, except 1 tablet Tuesday and Thursday. INR in 1 week. ? ?[phone] ?

## 2021-09-23 ENCOUNTER — Encounter: Payer: Self-pay | Admitting: Cardiology

## 2021-09-23 ENCOUNTER — Telehealth: Payer: Self-pay

## 2021-09-23 ENCOUNTER — Encounter: Payer: Self-pay | Admitting: Medical

## 2021-09-23 ENCOUNTER — Other Ambulatory Visit: Payer: Self-pay | Admitting: Cardiology

## 2021-09-23 DIAGNOSIS — G062 Extradural and subdural abscess, unspecified: Secondary | ICD-10-CM | POA: Diagnosis not present

## 2021-09-23 DIAGNOSIS — M4325 Fusion of spine, thoracolumbar region: Secondary | ICD-10-CM | POA: Diagnosis not present

## 2021-09-23 DIAGNOSIS — M4635 Infection of intervertebral disc (pyogenic), thoracolumbar region: Secondary | ICD-10-CM | POA: Diagnosis not present

## 2021-09-23 DIAGNOSIS — I480 Paroxysmal atrial fibrillation: Secondary | ICD-10-CM

## 2021-09-23 MED ORDER — WARFARIN SODIUM 5 MG PO TABS: ORAL_TABLET | ORAL | 0 refills | Status: DC

## 2021-09-23 MED ORDER — STIOLTO RESPIMAT 2.5-2.5 MCG/ACT IN AERS: 2.0000 | INHALATION_SPRAY | Freq: Every day | RESPIRATORY_TRACT | 5 refills | Status: DC

## 2021-09-23 NOTE — Telephone Encounter (Signed)
Lpm in regards to Warfarin refill.  Told him that I will reach out tomorrow to discuss. ?

## 2021-09-24 ENCOUNTER — Telehealth: Payer: Self-pay | Admitting: Medical

## 2021-09-24 MED ORDER — VALACYCLOVIR HCL 1 G PO TABS: 1000.0000 mg | ORAL_TABLET | Freq: Two times a day (BID) | ORAL | 0 refills | Status: AC

## 2021-09-24 NOTE — Telephone Encounter (Signed)
Chart opened to rx med, order lab, review chart, respond to my chart message or send message to staff member  

## 2021-09-25 ENCOUNTER — Telehealth: Payer: Self-pay | Admitting: Family Medicine

## 2021-09-25 NOTE — Telephone Encounter (Signed)
Greenlee on call ? ?Received call from [facility] that blood sugar was over 500. They stated they couldn't reach patient- straight to VM supposedly with no VM set up- when I called went straight to patient.  ? ?Called patient and he states was 180 or so this morning- denies sugar over 500.  ? ?He is on prednisone which can raise sugar but he denies over 500- unclear where this came from. Did have blood draw on Thursday so perhaps sugar was from then- unclear why would not result until Saturday though.  ? ?States already sees PCP on Monday- unless sugars very elevated again over 350 (seek care)- would wait until visit on Monday  ?

## 2021-09-27 ENCOUNTER — Ambulatory Visit (INDEPENDENT_AMBULATORY_CARE_PROVIDER_SITE_OTHER): Payer: Medicare Other | Admitting: Medical

## 2021-09-27 ENCOUNTER — Ambulatory Visit
Admission: RE | Admit: 2021-09-27 | Discharge: 2021-09-27 | Disposition: A | Discharge status: Home / Self Care | Payer: Medicare Other | Source: Ambulatory Visit | Attending: Surgery | Admitting: Surgery

## 2021-09-27 VITALS — BP 140/90 | HR 98 | Temp 97.7°F | Resp 18 | Ht 70.0 in | Wt 170.0 lb

## 2021-09-27 DIAGNOSIS — I10 Essential (primary) hypertension: Secondary | ICD-10-CM

## 2021-09-27 DIAGNOSIS — R739 Hyperglycemia, unspecified: Secondary | ICD-10-CM | POA: Diagnosis not present

## 2021-09-27 DIAGNOSIS — G8929 Other chronic pain: Secondary | ICD-10-CM

## 2021-09-27 DIAGNOSIS — S46112A Strain of muscle, fascia and tendon of long head of biceps, left arm, initial encounter: Secondary | ICD-10-CM | POA: Diagnosis not present

## 2021-09-27 DIAGNOSIS — M25512 Pain in left shoulder: Secondary | ICD-10-CM

## 2021-09-27 DIAGNOSIS — B001 Herpesviral vesicular dermatitis: Secondary | ICD-10-CM

## 2021-09-27 DIAGNOSIS — Z794 Long term (current) use of insulin: Secondary | ICD-10-CM | POA: Diagnosis not present

## 2021-09-27 DIAGNOSIS — E0865 Diabetes mellitus due to underlying condition with hyperglycemia: Secondary | ICD-10-CM

## 2021-09-27 DIAGNOSIS — M75102 Unspecified rotator cuff tear or rupture of left shoulder, not specified as traumatic: Secondary | ICD-10-CM | POA: Diagnosis not present

## 2021-09-27 DIAGNOSIS — I480 Paroxysmal atrial fibrillation: Secondary | ICD-10-CM

## 2021-09-27 DIAGNOSIS — M19012 Primary osteoarthritis, left shoulder: Secondary | ICD-10-CM | POA: Diagnosis not present

## 2021-09-27 DIAGNOSIS — M7542 Impingement syndrome of left shoulder: Secondary | ICD-10-CM

## 2021-09-27 LAB — POCT CBG (FASTING - GLUCOSE)-MANUAL ENTRY: Glucose Fasting, POC: 380 mg/dL — AB (ref 70–99)

## 2021-09-27 NOTE — Patient Instructions (Addendum)
Recent hyperglycemia.   but you were not taking Januvia and metformin.  Though you were using 10 units of Lantus daily.  Glad to hear that you started back on Januvia and metformin.  Please make sure that you are checking blood sugars fasting in the morning.  We went over how to titrate up on Lantus by 1 to 2 units based on fasting blood sugar levels.  If pulmonologist stops prednisone I expect that your sugars will improve.  If at any point you get sugar levels over 400 let us know.  If over 400 with any significant symptoms then recommend ED evaluation. ? ?Paroxysmal atrial fibrillation.  Upcoming ablation procedure scheduled for later this month. ? ?Biceps tendon tear and getting MRI of left upper extremity today. ? ?Hypertension-blood pressure decently controlled today. ? ?Recent cold sore outbreak and taking Valtrex. ? ?Post pneumonia shortness of breath.  Following up with pulmonologist tomorrow. ? ?Follow-up in 2 weeks or sooner if needed.  Please bring in a log of your daily sugar readings. ?

## 2021-09-27 NOTE — Progress Notes (Signed)
Subjective:    [name], male    DOB: Jun 22, 1946, 76 y.o.   MRN: [id number]  HPI Pt in for follow up.  Pt got refill of valtrex. He felt onset of cold sore. He sent message to me on Friday. I refilled his valtrex.   Also recent left shoulder pain. Pt saw Dr. Ricard Dillon who dx him with probable proximal bicep tendon tear. Pt will be scheduled for upcoming MRI this afternoon.   Also has upcoming heart ablation(atrial fibrillation)n in about 2 weeks.   End of January admitted for pneumonia/severe sepsis. Given rocephin and azithromycin.   Post DC from hosptial pulmonoloigst had placed on prednisone longer taper.   Diabetes- pt last a1c was 10.2. Pt on januvia 50 mg q day. Also on metformin 1000 mg po bid.  Pt is also on lantus 10 units at night. Pt got call from Dr. Yong Channel. His sugars were 500 last week when he got iv antibiotic for osteomyelitis. Pt admits he had not been taking [place] or metformin  but is recently sugar was 180. FS blood this morning was 380 on visit. He had cereal this morning.    Review of Systems  Constitutional:  Negative for chills, fatigue and fever.  Respiratory:  Positive for shortness of breath. Negative for cough, choking and wheezing.        Some daily sob but 02 % is 92-93.   Cardiovascular:  Negative for chest pain and palpitations.  Gastrointestinal:  Negative for abdominal pain, anal bleeding and blood in stool.  Genitourinary:  Negative for dysuria.  Musculoskeletal:  Negative for back pain, joint swelling and myalgias.  Neurological:  Negative for dizziness, numbness and headaches.  Hematological:  Negative for adenopathy. Does not bruise/bleed easily.  Psychiatric/Behavioral:  Negative for behavioral problems, decreased concentration, dysphoric mood and hallucinations. The patient is not nervous/anxious.     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   Esophageal reflux 02/10/2015   Fatigue    HTN (hypertension)    Hyperglycemia    preDM    Hyperlipidemia    Insomnia    Low back pain radiating to both legs 01/16/2017   Measles as a child   Overweight(278.02)    Peripheral neuropathy 08/07/2019   Personal history of colonic polyps 10/27/2012   Follows with [facility]   Pneumonia    " walking"   Preventative health care    Testosterone deficiency 05/23/2012   Wears glasses      Social History   Socioeconomic History   Marital status: Married    Spouse name: Not on file   Number of children: Not on file   Years of education: Not on file   Highest education level: Not on file  Occupational History   Not on file  Tobacco Use   Smoking status: Former    Packs/day: 1.00    Years: 20.00    Pack years: 20.00    Types: Cigarettes  Quit date: 07/18/1989    Years since quitting: 32.2   Smokeless tobacco: Never  Vaping Use   Vaping Use: Never used  Substance and Sexual Activity   Alcohol use: Not Currently   Drug use: No   Sexual activity: Yes    Comment: lives with wife, still working, no dietary restrictions, continues to exercise intermittently  Other Topics Concern   Not on file  Social History Narrative   Not on file   Social Determinants of Health   Financial Resource Strain: Low Risk    Difficulty of Paying Living Expenses: Not hard at all  Food Insecurity: No Food Insecurity   Worried About Charity fundraiser in the Last Year: Never true   Arboriculturist in the Last Year: Never true  Transportation Needs: No Transportation Needs    Lack of Transportation (Medical): No   Lack of Transportation (Non-Medical): No  Physical Activity: Sufficiently Active   Days of Exercise per Week: 7 days   Minutes of Exercise per Session: 30 min  Stress: No Stress Concern Present   Feeling of Stress : Not at all  Social Connections: Moderately Integrated   Frequency of Communication with Friends and Family: More than three times a week   Frequency of Social Gatherings with Friends and Family: More than three times a week   Attends Religious Services: More than 4 times per year   Active Member of Clubs or Organizations: No   Attends Archivist Meetings: Never   Marital Status: Married  Human resources officer Violence: Not At Risk   Fear of Current or Ex-Partner: No   Emotionally Abused: No   Physically Abused: No   Sexually Abused: No    Past Surgical History:  Procedure Laterality Date   BACK SURGERY  2012 and 1994   Dr Margret Chance, screws and cage in low back   BIOPSY  01/09/2020   Procedure: BIOPSY;  Surgeon: Otis Brace, MD;  Location: [facility];  Service: Gastroenterology;;   BRONCHIAL BIOPSY  11/17/2020   Procedure: BRONCHIAL BIOPSIES;  Surgeon: Laurin Coder, MD;  Location: [facility];  Service: Endoscopy;;   BRONCHIAL BRUSHINGS  11/17/2020   Procedure: BRONCHIAL BRUSHINGS;  Surgeon: Laurin Coder, MD;  Location: [facility];  Service: Endoscopy;;   BRONCHIAL WASHINGS  11/17/2020   Procedure: BRONCHIAL WASHINGS;  Surgeon: Laurin Coder, MD;  Location: [facility];  Service: Endoscopy;;   COLONOSCOPY WITH PROPOFOL N/A 01/09/2020   Procedure: COLONOSCOPY WITH PROPOFOL;  Surgeon: Otis Brace, MD;  Location: [facility];  Service: Gastroenterology;  Laterality: N/A;   ESOPHAGOGASTRODUODENOSCOPY (EGD) WITH PROPOFOL N/A 01/09/2020   Procedure: ESOPHAGOGASTRODUODENOSCOPY (EGD) WITH PROPOFOL;  Surgeon: Otis Brace, MD;  Location: [facility];  Service: Gastroenterology;  Laterality: N/A;   EYE  SURGERY Bilateral    2016   HARDWARE REVISION  03/12/2019   REVISION OF HARDWARE THORACIC TEN-THORACIC ELEVEN WITH APPLICATION OF ADDITIONAL RODS AND ROD SLEEVES THORACIC EIGHT-THORACIC TWELVE 12-LUMBAR ONE (N/A )   HEMOSTASIS CONTROL  11/17/2020   Procedure: HEMOSTASIS CONTROL;  Surgeon: Laurin Coder, MD;  Location: [facility];  Service: Endoscopy;;   IR US GUIDE BX ASP/DRAIN  11/13/2020   LAMINECTOMY  02/12/2019   DECOMPRESSIVE LAMINECTOMY THORACIC NINE-THORACIC TEN AND THORACIC TEN-THORACIC ELEVEN, EXTENSION OF THORACOLUMBAR FUSION FROM THORACIC TEN TO THORACIC FIVE, LOCAL BONE GRAFT, ALLOGRAFT AND VIVIGEN (N/A)   POLYPECTOMY  01/09/2020   Procedure: POLYPECTOMY;  Surgeon: Otis Brace, MD;  Location: [facility];  Service: Gastroenterology;;  REMOVAL OF RODS AND PEDICLE SCREWS T5 AND T6, EXPLORATION OF FUSION, BIOPSY TRANSPEDICULAR T5 AND T6 (N/A )  09/14/2020   SPINAL FUSION N/A 02/12/2019   Procedure: DECOMPRESSIVE LAMINECTOMY THORACIC NINE-THORACIC TEN AND THORACIC TEN-THORACIC ELEVEN, EXTENSION OF THORACOLUMBAR FUSION FROM THORACIC TEN TO THORACIC FIVE, LOCAL BONE GRAFT, ALLOGRAFT AND VIVIGEN;  Surgeon: Jessy Oto, MD;  Location: [place];  Service: Orthopedics;  Laterality: N/A;  DECOMPRESSIVE LAMINECTOMY THORACIC NINE-THORACIC TEN AND THORACIC TEN-THORACIC ELEVEN, EXTENSION OF TH   SPINAL FUSION N/A 03/12/2019   Procedure: REVISION OF HARDWARE THORACIC TEN-THORACIC ELEVEN WITH APPLICATION OF ADDITIONAL RODS AND ROD SLEEVES THORACIC EIGHT-THORACIC TWELVE 12-LUMBAR ONE;  Surgeon: Jessy Oto, MD;  Location: [place];  Service: Orthopedics;  Laterality: N/A;   TONSILLECTOMY     as child   torn rotator cuff  2010   right   VIDEO BRONCHOSCOPY N/A 11/17/2020   Procedure: VIDEO BRONCHOSCOPY WITH FLUORO;  Surgeon: Laurin Coder, MD;  Location: [facility];  Service: Endoscopy;  Laterality: N/A;    Family History  Problem Relation Age of Onset   Hypertension Mother     Diabetes Mother        type 2   Cancer Mother 5       breast in remission   Emphysema Father        smoker   COPD Father        smoker   Stroke Father 72       mini   Heart disease Father    Hypertension Sister    Hyperlipidemia Sister    Scoliosis Sister    Osteoporosis Sister    Hypertension Maternal Grandmother    Scoliosis Maternal Grandmother    Heart disease Maternal Grandfather    Heart disease Paternal Grandfather        smoker   Heart disease Daughter     Allergies  Allergen Reactions   Daptomycin     Drug induced pneumonia    Diltiazem Other (See Comments)    Passed out    Current Outpatient Medications on File Prior to Visit  Medication Sig Dispense Refill   albuterol (PROVENTIL HFA) 108 (90 Base) MCG/ACT inhaler TAKE 2 PUFFS BY MOUTH EVERY 6 HOURS AS NEEDED FOR WHEEZE OR SHORTNESS OF BREATH 8 g 5   atorvastatin (LIPITOR) 10 MG tablet TAKE 1 TABLET BY MOUTH EVERY DAY 90 tablet 1   DALVANCE 500 MG SOLR Inject 500 mg into the vein every 14 (fourteen) days. Pt states this is a every 2 week antibiotic. Could not state how long therapy should be.     enoxaparin (LOVENOX) 80 MG/0.8ML injection Inject 0.8 mLs (80 mg total) into the skin every 12 (twelve) hours. 80 mL 1   glucose blood (CONTOUR NEXT TEST) test strip Check blood sugar once daily 100 each 12   insulin glargine (LANTUS) 100 UNIT/ML Solostar Pen Inject 10 Units into the skin daily. 15 mL 0   Insulin Pen Needle (PEN NEEDLES 3/16") 31G X 5 MM MISC Use with Lantus 100 each 0   metFORMIN (GLUCOPHAGE) 1000 MG tablet TAKE 1 TABLET (1,000 MG TOTAL) BY MOUTH 2 (TWO) TIMES DAILY WITH A MEAL. 180 tablet 0   mupirocin ointment (BACTROBAN) 2 % Apply 1 application topically 2 (two) times daily. 22 g 1   oxyCODONE-acetaminophen (PERCOCET/ROXICET) 5-325 MG tablet Take 1 tablet by mouth every 4 (four) hours as needed for moderate pain.     pantoprazole (PROTONIX) 40 MG tablet TAKE 1  TABLET BY MOUTH TWICE A DAY BEFORE A  MEAL (Patient taking differently: 40 mg 2 (two) times daily before a meal.) 180 tablet 1   predniSONE (DELTASONE) 10 MG tablet Take 1 tablet (10 mg total) by mouth daily with breakfast. 30 tablet 3   pregabalin (LYRICA) 75 MG capsule TAKE 1 CAPSULE BY MOUTH TWICE A DAY (Patient taking differently: Take 75 mg by mouth 2 (two) times daily.) 60 capsule 3   sitaGLIPtin (JANUVIA) 50 MG tablet Take 1 tablet (50 mg total) by mouth daily. 30 tablet 3   STIOLTO RESPIMAT 2.5-2.5 MCG/ACT AERS Inhale 2 puffs into the lungs daily. 1 each 5   tamsulosin (FLOMAX) 0.4 MG CAPS capsule Take 2 capsules (0.8 mg total) by mouth daily. (Patient taking differently: Take 0.8 mg by mouth at bedtime.) 180 capsule 3   tiZANidine (ZANAFLEX) 4 MG tablet TAKE 1.5 TABLETS (6 MG TOTAL) BY MOUTH EVERY 6 (SIX) HOURS AS NEEDED FOR MUSCLE SPASMS. 540 tablet 1   valACYclovir (VALTREX) 1000 MG tablet Take 1 tablet (1,000 mg total) by mouth 2 (two) times daily for 10 days. 20 tablet 0   venlafaxine XR (EFFEXOR-XR) 150 MG 24 hr capsule TAKE 1 CAPSULE BY MOUTH DAILY WITH BREAKFAST. (Patient taking differently: 150 mg daily with breakfast.) 90 capsule 0   warfarin (COUMADIN) 5 MG tablet Take 1-2 tablets Daily or as prescribed by [facility] 60 tablet 0   zolpidem (AMBIEN) 5 MG tablet Take 1 tablet (5 mg total) by mouth at bedtime as needed. for sleep 30 tablet 0   Current Facility-Administered Medications on File Prior to Visit  Medication Dose Route Frequency Provider Last Rate Last Admin   testosterone cypionate (DEPOTESTOSTERONE CYPIONATE) injection 200 mg  200 mg Intramuscular Q14 Days Dyasia Firestine, PA-C   200 mg at 07/06/21 0953    BP 140/90    Pulse 98    Temp 97.7 F (36.5 C)    Resp 18    Ht '5\' 10"'$  (1.778 m)    Wt 170 lb (77.1 kg)    SpO2 93%    BMI 24.39 kg/m        Objective:   Physical Exam   General Mental Status- Alert. General Appearance- Not in acute distress.   Skin General: Color- Normal Color.  Moisture- Normal Moisture.  Neck Carotid Arteries- Normal color. Moisture- Normal Moisture. No carotid bruits. No JVD.  Chest and Lung Exam Auscultation: Breath Sounds:-Normal.  Cardiovascular Auscultation:Rythm- Regular. Murmurs & Other Heart Sounds:Auscultation of the heart reveals- No Murmurs.  Abdomen Inspection:-Inspeection Normal. Palpation/Percussion:Note:No mass. Palpation and Percussion of the abdomen reveal- Non Tender, Non Distended + BS, no rebound or guarding.   Neurologic Cranial Nerve exam:- CN III-XII intact(No nystagmus), symmetric smile. Strength:- 5/5 equal and symmetric strength both upper and lower extremities.      Assessment & Plan:   Patient Instructions  Recent hyperglycemia.   but you were not taking Januvia and metformin.  Though you were using 10 units of Lantus daily.  Glad to hear that you started back on Januvia and metformin.  Please make sure that you are checking blood sugars fasting in the morning.  We went over how to titrate up on Lantus by 1 to 2 units based on fasting blood sugar levels.  If pulmonologist stops prednisone I expect that your sugars will improve.  If at any point you get sugar levels over 400 let us know.  If over 400 with any significant symptoms then recommend ED evaluation.  Paroxysmal atrial fibrillation.  Upcoming ablation procedure scheduled for later this month.  Biceps tendon tear and getting MRI of left upper extremity today.  Hypertension-blood pressure decently controlled today.  Recent cold sore outbreak and taking Valtrex.  Post pneumonia shortness of breath.  Following up with pulmonologist tomorrow.  Follow-up in 2 weeks or sooner if needed.  Please bring in a log of your daily sugar readings.   Mackie Pai, PA-C

## 2021-09-28 ENCOUNTER — Other Ambulatory Visit: Payer: Self-pay

## 2021-09-28 ENCOUNTER — Other Ambulatory Visit (HOSPITAL_COMMUNITY): Payer: Self-pay | Admitting: Cardiology

## 2021-09-28 ENCOUNTER — Encounter: Payer: Self-pay | Admitting: Internal Medicine

## 2021-09-28 ENCOUNTER — Other Ambulatory Visit: Payer: Self-pay | Admitting: Cardiology

## 2021-09-28 ENCOUNTER — Ambulatory Visit: Payer: Medicare Other | Admitting: Internal Medicine

## 2021-09-28 ENCOUNTER — Telehealth (HOSPITAL_COMMUNITY): Payer: Self-pay | Admitting: Emergency Medicine

## 2021-09-28 VITALS — BP 120/70 | HR 90 | Temp 97.7°F | Ht 70.0 in | Wt 171.4 lb

## 2021-09-28 DIAGNOSIS — J849 Interstitial pulmonary disease, unspecified: Secondary | ICD-10-CM | POA: Diagnosis not present

## 2021-09-28 DIAGNOSIS — Z86711 Personal history of pulmonary embolism: Secondary | ICD-10-CM

## 2021-09-28 DIAGNOSIS — J9621 Acute and chronic respiratory failure with hypoxia: Secondary | ICD-10-CM | POA: Diagnosis not present

## 2021-09-28 DIAGNOSIS — Z87891 Personal history of nicotine dependence: Secondary | ICD-10-CM | POA: Diagnosis not present

## 2021-09-28 DIAGNOSIS — R0609 Other forms of dyspnea: Secondary | ICD-10-CM

## 2021-09-28 DIAGNOSIS — J302 Other seasonal allergic rhinitis: Secondary | ICD-10-CM

## 2021-09-28 DIAGNOSIS — I4891 Unspecified atrial fibrillation: Secondary | ICD-10-CM

## 2021-09-28 LAB — CBC WITH DIFFERENTIAL/PLATELET
Basophils Absolute: 0 10*3/uL (ref 0.0–0.1)
Basophils Relative: 0.3 % (ref 0.0–3.0)
Eosinophils Absolute: 0.1 10*3/uL (ref 0.0–0.7)
Eosinophils Relative: 1.5 % (ref 0.0–5.0)
HCT: 36.2 % — ABNORMAL LOW (ref 39.0–52.0)
Hemoglobin: 12.1 g/dL — ABNORMAL LOW (ref 13.0–17.0)
Lymphocytes Relative: 4.9 % — ABNORMAL LOW (ref 12.0–46.0)
Lymphs Abs: 0.5 10*3/uL — ABNORMAL LOW (ref 0.7–4.0)
MCHC: 33.4 g/dL (ref 30.0–36.0)
MCV: 87.1 fl (ref 78.0–100.0)
Monocytes Absolute: 0.8 10*3/uL (ref 0.1–1.0)
Monocytes Relative: 9.1 % (ref 3.0–12.0)
Neutro Abs: 7.7 10*3/uL (ref 1.4–7.7)
Neutrophils Relative %: 84.2 % — ABNORMAL HIGH (ref 43.0–77.0)
Platelets: 350 10*3/uL (ref 150.0–400.0)
RBC: 4.16 Mil/uL — ABNORMAL LOW (ref 4.22–5.81)
RDW: 16.7 % — ABNORMAL HIGH (ref 11.5–15.5)
WBC: 9.2 10*3/uL (ref 4.0–10.5)

## 2021-09-28 NOTE — Telephone Encounter (Signed)
Pt calling stating he was supposed to get ablation last year but got sick and procedure was postponed.  ? ?Ablation now r/s and needs PV CTA and instructions to follow prior to ablation and CT.  ? ?Marchia Bond RN Navigator Cardiac Imaging ?[facility] ?[phone] Office  ?[phone] Cell ? ?

## 2021-09-28 NOTE — Patient Instructions (Addendum)
Acute on chronic respiratory failure with hypoxia [facility]) - Spring 2022 & Dec 2022/Jan 2023 ?Interstitial pulmonary disease ([place]) ?History of smoking 20 ppd - possible emphsyema on CT ? ?-You are significantly better since admission New Year's 2023 and also JAn 2023 visit ?- Unclear cause of flareup of interstitial lung disease.  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 ?- CT angio chest Feb 2023 shows improvement from Jul 17, 2021 admission ? ?-Your O2 needs are better since Jan 2023 - now not needing it with simple exetion ? ?Plan ?- Continue oxygen with at night and exertion > 600 feet (200 yards) ?- Continue prednisone 10 mg/day continue at that dose ? -I am still hesitant to stop prednisone at this point at least for a few months till we get clarity on your lung disease and course ?- Do spirometry and dlco in 8 weeks ?- check blood alpha 1 AT ? ? ?History of pulmonary embolism July 17, 2021 and DVT spring 2022 ? ? -Glad you are better ?-History review shows testosterone is a risk factor for pulm embolism - hope you have stopped it ? ?Plan ?- Coumadin for A Fib and blood clot per PCP  and [facility] ? - life long anticoagulation for clots but leave decision to cardiology and PCP ? ?History Seasonal Allergies ? ?Plan ? - check cbc with diff, IgE and RAST allergy panel ?- ? ?Cough and shortness of breath ? ? - can be multifactorial - allergies, heart issues and lung issues ? ?Plan ? - based  on above ? - see if abltation helps any ?-continue stiolto as before ? ? ?Follow-up ?- Do spirometry and DLCO in 8 weeks ?-Return to see Dr. Chase Caller after that for 30-minute visit  ?- [simple walking desaturation test and symptom score at follow-up] ? - check feno at next visit ? ?

## 2021-09-28 NOTE — Progress Notes (Signed)
? ? ? ?Referring provider: ?Saguier, Percell Miller, PA-C ? ?HPI: ?76 year old male, former smoker (20-pack-year history).  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  ? ?Previous [facility] pulmonary encounters:  ?12/29/20- Dr. Chase Caller ?Chief Complaint  ?Patient presents with  ? Follow-up  ?  PFT  performed 5/24.  Pt states since last visit, he has been doing okay. States he has lost some weight that he has noticed since last visit.   ? ?Follow-up drug-induced pneumonitis daptomycin from early May 2022 ? ?HPI ?Luis Brown 76 y.o. -returns for follow-up.  I personally saw him in the hospital on 11/20/2020.  At that point in time he had been started on Solu-Medrol on 11/18/2020.  When I saw him he was on day 3 of Solu-Medrol and he was needing 3 L nasal cannula oxygen at rest.  At that point in time even with 3 L he was desaturating to 86% just talking.  Put him on prednisone 60 mg/day.  After that he see nurse practitioner x2 at least 1 of which was a telephone visit..  As part of his follow-up on 12/11/2020 he had a CT scan of the chest [3 days after pulmonary function test] and on my personal visualization it showed new onset pneumothorax but significant improvement in infiltrates and inflammation.  He had another follow-up chest x-ray 12/17/2020 in which no pneumothorax reported [similar on 12/15/2020] and pulmonary infiltrates appear improved.  He is now here with his wife ? ?He tells me he is now on 10 mg/day prednisone he just darted that today.  He is 1 week left and after that the prednisone taper ends.  Because of the pneumothorax is physical therapy has been on hold but 4 days ago he was evaluated and apparently his pulse ox was 100% but this was on oxygen.  He continues to use his  oxygen although he is not sure if he is desaturating anymore.  He does feel overall improved compared to 6 weeks ago but still has significant amount of fatigue.  He has significant insomnia which he believes is due to prednisone.  Today's last night was the first night he could sleep when he dropped his prednisone down to 10 mg/day.  He believes his fatigue is multifactorial related to the disease, multiple medications, lack of oxygen, insomnia etc. ? ?02/09/2021 ?Patient presents today for 1 month follow-up. Since his last visit in June, he has established with cardiology and was dx with afib. He is currently on Xarelto and Digoxin. His wife states that he has had more energy recently. He is scheduled for testosterone injection tomorrow. He gets winded with stairs and heavy exertion. He reports intermittent wheezing. He needs refill of albuterol rescue inhaler. He has weaned himself of oxygen and O2 equipment has been picked up. He has been sleeping better at night. Anxiety has improved, he stopped xanax. Due for follow-up CXR today. 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 ? ?02/23/2021 ?Patient presents today for 2 week follow-up. During his last visit he was given trial Spiriva Respimat d/t dyspnea /wheezing symptoms and moderate obstruction seen on his PFTs. He has been using sample of Spiriva Respimat 2.34mg as prescribed, he occasionally misses a dose for 1-2 days. He states that his wheezing has stopped but he has not noticed a significant difference in dyspnea  symptoms. His ILD scale appears improved from two weeks go. He has having some muscle spasms in his back that started before being placed on LABA.  ? ?04/26/2021 -   ?Chief Complaint  ?Patient presents with  ? Follow-up  ?  F/u, trelegy medication concerns ?  ? ?Luis Brown 76 y.o. -returns for follow-up.  Since I last saw him he seen nurse practitioner 2 times.  Symptom scores for  dyspnea continue to improve.  He was also walked for oxygen desaturation February 04, 2019 did not desaturate.  He says he continues to get better.  He is off prednisone.  Nurse practitioner noticed obstructive physiology on the pulmonary function test.  Put him on inhaler.  Currently is on Trelegy.  He says it helps him tremendously.  He feels it opens up his lungs.  He declined his flu shot today because he will have it after the COVID-vaccine in October or November 2022. ? ? ?06/24/2021- Interim hx  ?Patient presents today for acute OV/cough. Patient reports increased shortness of breath since 06/13/21 with associated fatigue, cough and chest tightness. Cough is productive with clear mucus. Symptoms started after Thanksgiving. His wife had the flu at the same time. His cough initially improved while taking mucinex, he stopped since he was starting to feel better. Denies f/c/s, purulent mucus, N/V/D. CAT score 17.  ? ?Inpatient consult note 07/17/2022 by Dr. Chase Caller at [facility] ?This is a 76 year old male with poorly controlled diabetes, hemoglobin A1c 7.4, chronic opioid use for back pain, BPH on Flomax, anxiety and depression, paroxysmal A. fib on metoprolol digoxin and Xarelto .  He had long hx of back infection and was on daptomycin.  Then in  spring 2022 was admitted at [facility] with R pneumonitis considered to be daptomycin related pneumonitis.  Was treated with steroids.  Course complicated by right upper extremity DVT secondary to PICC line for which she was on Xarelto.  He is requiring oxygen at that time.  By the summer 2022 he was off oxygen and able to ambulate even without oxygen.  Course was also complicated later in May 2022 with spontaneous pneumothorax.  By the summer 2022 he was able to come off prednisone.  He also had diagnosed of atrial fibrillation for which he was on digoxin and Xarelto was continued [although originally started for DVT].  Last seen in clinic October  2022 at which time he was off oxygen and off steroids.  He was on Trelegy inhaler for pulmonary function test that back in May 2022 showed obstructive physiology.  He says he was feeling better.  He is only minimally symptomatic.  Plan at that time was to get a repeat high-resolution CT chest done in the spring 2023. ?  ?On Thanksgiving 2022 his wife had the flu and he was exposed to this and after that he started having increasing cough and fatigue.  Up until this point he was able to do walking and fishing.  He saw  pulm nurse practitioner 06/24/2021 in the pulmonary clinic.  Was given 8-day prednisone taper for presumed diagnosis of COPD exacerbation.  No antibiotic.  He did see infectious diseases at the same time and for his back infections it was noted, "10 days of linezolid + 8 weeks of tedizolid for his spinal infection and has now been started on dalbavancin infusions every 2 weeks last month as a suppressive regim" (none of which appear to be associated with drug-induced ILD] ?  ?  ?  ?  He then called into the pulmonary office on 07/13/2021 felt he was desaturating mildly and coughing up green mucus and fevers..  2 COVID home test were negative.  He had a fever of 100.7.  He wanted some prednisone.  We gave him some Omnicef as well and a 5-day prednisone taper but also advised that to report to the ER.  He then presented on 07/15/2021 for admission with acute hypoxic respiratory failure 58% and a history of 1 week of cough and shortness of breath getting worse.  Appears he might not have taken Omnicef.  He had lactic acidosis 2.3 at admission, white count of nearly 15,000 with lymphopenia.  CT angiogram showed acute PE segmental and subsegmental right-sided [despite being on Xarelto] started on IV heparin started on Solu-Medrol and antibiotics . CT also showed L >>> R (opposted from may 2022) GGO. and .  His Doppler ultrasound lower extremities negative for DVT. ?  ?On 07/16/2021 he was stable on 4 L oxygen.   His flu, multiplex PCR and MRSA PCR were negative.  Procalcitonin 0.21.  Echo is normal with normal RV.  Then on 12/3 1/22 seen progressively more hypoxemic now requiring 8 L of oxygen.  Briefly required even

## 2021-09-29 ENCOUNTER — Ambulatory Visit (INDEPENDENT_AMBULATORY_CARE_PROVIDER_SITE_OTHER): Payer: Medicare Other

## 2021-09-29 DIAGNOSIS — Z7901 Long term (current) use of anticoagulants: Secondary | ICD-10-CM | POA: Diagnosis not present

## 2021-09-29 DIAGNOSIS — I480 Paroxysmal atrial fibrillation: Secondary | ICD-10-CM | POA: Diagnosis not present

## 2021-09-29 LAB — POCT INR: INR: 2.4 (ref 2.0–3.0)

## 2021-09-29 NOTE — Patient Instructions (Signed)
increase to 1.5 tablets Daily, except 1 tablet Wednesday. INR in 1 week.  Ablation 3/28 ? ?[id number] ?

## 2021-09-30 ENCOUNTER — Ambulatory Visit: Payer: Medicare Other | Admitting: Surgery

## 2021-09-30 ENCOUNTER — Other Ambulatory Visit: Payer: Self-pay

## 2021-09-30 ENCOUNTER — Encounter: Payer: Self-pay | Admitting: Surgery

## 2021-09-30 VITALS — BP 121/71 | HR 84 | Ht 70.0 in | Wt 171.4 lb

## 2021-09-30 DIAGNOSIS — M7542 Impingement syndrome of left shoulder: Secondary | ICD-10-CM

## 2021-09-30 LAB — RESPIRATORY ALLERGY PROFILE REGION II ~~LOC~~

## 2021-09-30 LAB — INTERPRETATION:

## 2021-09-30 NOTE — Progress Notes (Signed)
76 year old white male history of left shoulder pain comes in for review of his MRI that was done September 27, 2021.  Study from that day showed: ? ?CLINICAL DATA:  Left shoulder and arm pain for 3 weeks. No known ?injury or prior relevant surgery. Rotator cuff disorder suspected. ?Labral tear suspected. Possible rotator cuff tear and long head ?biceps tendon tear. ?  ?EXAM: ?MRI OF THE LEFT SHOULDER WITHOUT CONTRAST ?  ?TECHNIQUE: ?Multiplanar, multisequence MR imaging of the shoulder was performed. ?No intravenous contrast was administered. ?  ?COMPARISON:  Radiographs 09/09/2021. Report only from remote MRI ?01/06/2011. ?  ?FINDINGS: ?Rotator cuff: Severe tendinosis of the supraspinatus and ?infraspinatus tendons with multiple areas of high-grade interstitial ?tearing. There may be a small full-thickness tear near the ?musculotendinous junction of the supraspinatus tendon, but no ?significant tendon retraction. There is mild subscapularis ?tendinosis. The teres minor tendon appears normal. ?  ?Muscles:  No focal muscular atrophy or edema. ?  ?Biceps long head: The long head of the biceps tendon appears ?completely ruptured and retracted into the bicipital groove. ?  ?Acromioclavicular Joint: The acromion is type 2. There are ?mild-to-moderate acromioclavicular degenerative changes with a small ?amount of fluid in the subacromial-subdeltoid bursa. There is a ?moderate amount of fluid anteriorly in the subcoracoid bursa. ?  ?Glenohumeral Joint: Mild glenohumeral degenerative changes. No ?significant shoulder joint effusion. ?  ?Labrum: Fetal assessment limited by the lack of joint fluid. No ?evidence of labral tear or significant paralabral cyst. ?  ?Bones: No acute or significant extra-articular osseous ?findings.There is subcortical cyst formation in the greater ?tuberosity near the supraspinatus and infraspinatus insertions. ?  ?Other: No significant soft tissue findings. ?  ?IMPRESSION: ?1. Diffuse rotator cuff  tendinosis with multiple areas of ?interstitial tearing within the supraspinatus and infraspinatus ?tendons. There may be a small full-thickness nonretracted tear near ?the musculotendinous junction of the supraspinatus tendon. No ?significant muscular atrophy. ?2. Complete rupture of the long head of the biceps tendon. ?3. No acute findings. Mild glenohumeral and mild-to-moderate ?acromioclavicular degenerative changes. Fluid in the ?subacromial-subdeltoid and subcoracoid bursa. ?4. The labrum appears intact. ?  ?  ?Electronically Signed ?  By: Richardean Sale M.D. ?  On: 09/28/2021 13:02 ? ?Continues to have pain throughout his shoulder with overhead activity and reach behind his back. ? ? ? ?Plan ?I did review the scan with Dr. Marlou Sa.  States that he will see the patient in a couple weeks to discuss potential treatment options which may include surgery.  I discussed the MRI findings with patient and his wife was present in great detail along with potential surgical options.  All questions answered. ?

## 2021-09-30 NOTE — Telephone Encounter (Addendum)
Attempted to reach pt, unable to leave message ? ?CT and ablation instruction letters sent via mychart ?

## 2021-10-03 ENCOUNTER — Other Ambulatory Visit: Payer: Self-pay | Admitting: Medical

## 2021-10-04 ENCOUNTER — Telehealth (HOSPITAL_COMMUNITY): Payer: Self-pay | Admitting: Emergency Medicine

## 2021-10-04 DIAGNOSIS — I4891 Unspecified atrial fibrillation: Secondary | ICD-10-CM

## 2021-10-04 MED ORDER — METOPROLOL TARTRATE 50 MG PO TABS: 100.0000 mg | ORAL_TABLET | Freq: Once | ORAL | 0 refills | Status: DC

## 2021-10-04 MED ORDER — VENLAFAXINE HCL ER 150 MG PO CP24
150.0000 mg | ORAL_CAPSULE | Freq: Every day | ORAL | 0 refills | Status: DC
Start: 2021-10-04 — End: 2022-04-01

## 2021-10-04 NOTE — Telephone Encounter (Signed)
Attempted to call patient regarding upcoming cardiac CT appointment. °Left message on voicemail with name and callback number °Yuriel Lopezmartinez RN Navigator Cardiac Imaging °[facility] °[phone] Office °[phone] Cell ° °

## 2021-10-04 NOTE — Telephone Encounter (Signed)
Reaching out to patient to offer assistance regarding upcoming cardiac imaging study; pt verbalizes understanding of appt date/time, parking situation and where to check in, pre-test NPO status and medications ordered, and verified current allergies; name and call back number provided for further questions should they arise ?Marchia Bond RN Navigator Cardiac Imaging ?[facility] ?[phone] office ?[phone] cell ? ?'100mg'$  metoprolol tartrate ?Denies iv issues ?Arrival 730 ?

## 2021-10-05 ENCOUNTER — Other Ambulatory Visit: Payer: Self-pay

## 2021-10-05 ENCOUNTER — Other Ambulatory Visit: Payer: Self-pay | Admitting: Family

## 2021-10-05 ENCOUNTER — Inpatient Hospital Stay: Payer: Medicare Other | Admitting: Family

## 2021-10-05 ENCOUNTER — Inpatient Hospital Stay: Payer: Medicare Other | Attending: Hematology & Oncology

## 2021-10-05 ENCOUNTER — Encounter: Payer: Self-pay | Admitting: Internal Medicine

## 2021-10-05 ENCOUNTER — Telehealth: Payer: Self-pay | Admitting: Cardiology

## 2021-10-05 ENCOUNTER — Encounter: Payer: Self-pay | Admitting: Family

## 2021-10-05 VITALS — BP 137/74 | HR 82 | Temp 97.8°F | Resp 18 | Wt 175.8 lb

## 2021-10-05 DIAGNOSIS — D6859 Other primary thrombophilia: Secondary | ICD-10-CM

## 2021-10-05 DIAGNOSIS — I82A11 Acute embolism and thrombosis of right axillary vein: Secondary | ICD-10-CM | POA: Insufficient documentation

## 2021-10-05 DIAGNOSIS — Z7901 Long term (current) use of anticoagulants: Secondary | ICD-10-CM | POA: Insufficient documentation

## 2021-10-05 DIAGNOSIS — I2699 Other pulmonary embolism without acute cor pulmonale: Secondary | ICD-10-CM | POA: Diagnosis not present

## 2021-10-05 DIAGNOSIS — I4891 Unspecified atrial fibrillation: Secondary | ICD-10-CM | POA: Diagnosis not present

## 2021-10-05 LAB — CBC WITH DIFFERENTIAL (CANCER CENTER ONLY)
Abs Immature Granulocytes: 0.03 10*3/uL (ref 0.00–0.07)
Basophils Absolute: 0.1 10*3/uL (ref 0.0–0.1)
Basophils Relative: 1 %
Eosinophils Absolute: 0.2 10*3/uL (ref 0.0–0.5)
Eosinophils Relative: 2 %
HCT: 36.5 % — ABNORMAL LOW (ref 39.0–52.0)
Hemoglobin: 11.9 g/dL — ABNORMAL LOW (ref 13.0–17.0)
Immature Granulocytes: 0 %
Lymphocytes Relative: 11 %
Lymphs Abs: 0.8 10*3/uL (ref 0.7–4.0)
MCH: 28.8 pg (ref 26.0–34.0)
MCHC: 32.6 g/dL (ref 30.0–36.0)
MCV: 88.4 fL (ref 80.0–100.0)
Monocytes Absolute: 0.9 10*3/uL (ref 0.1–1.0)
Monocytes Relative: 13 %
Neutro Abs: 5.3 10*3/uL (ref 1.7–7.7)
Neutrophils Relative %: 73 %
Platelet Count: 322 10*3/uL (ref 150–400)
RBC: 4.13 MIL/uL — ABNORMAL LOW (ref 4.22–5.81)
RDW: 15.1 % (ref 11.5–15.5)
WBC Count: 7.3 10*3/uL (ref 4.0–10.5)
nRBC: 0 % (ref 0.0–0.2)

## 2021-10-05 LAB — CMP (CANCER CENTER ONLY)
ALT: 13 U/L (ref 0–44)
AST: 19 U/L (ref 15–41)
Albumin: 4 g/dL (ref 3.5–5.0)
Alkaline Phosphatase: 63 U/L (ref 38–126)
Anion gap: 9 (ref 5–15)
BUN: 10 mg/dL (ref 8–23)
CO2: 30 mmol/L (ref 22–32)
Calcium: 9.2 mg/dL (ref 8.9–10.3)
Chloride: 92 mmol/L — ABNORMAL LOW (ref 98–111)
Creatinine: 0.85 mg/dL (ref 0.61–1.24)
GFR, Estimated: >60 mL/min (ref >60)
Glucose, Bld: 241 mg/dL — ABNORMAL HIGH (ref 70–99)
Potassium: 4.3 mmol/L (ref 3.5–5.1)
Sodium: 131 mmol/L — ABNORMAL LOW (ref 135–145)
Total Bilirubin: 0.8 mg/dL (ref 0.3–1.2)
Total Protein: 6.6 g/dL (ref 6.5–8.1)

## 2021-10-05 NOTE — Telephone Encounter (Signed)
Left message to call back  

## 2021-10-05 NOTE — Telephone Encounter (Signed)
Pt aware he is scheduled to see Dr. Curt Bears on Monday, day before ablation. ?Patient verbalized understanding and agreeable to plan.  ? ?

## 2021-10-05 NOTE — Progress Notes (Unsigned)
?Hematology and Oncology Follow Up Visit ? ?Luis Brown ?[id number] ?1946-01-24 76 y.o. ?10/05/2021 ? ? 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 ?Past therapy: ?Xarelto - DVT occurred while taking  ?Lovenox - significant bruising  ? ?Current Therapy:   ?Coumadin managed by cardiology and coumadin clinic  ?  ?Interim History:  Luis Brown is here today for follow-up. He is  ? ? ?ECOG Performance Status: 1 - Symptomatic but completely ambulatory ? ?Medications:  ?Allergies as of 10/05/2021   ? ?   Reactions  ? Daptomycin   ? Drug induced pneumonia   ? Diltiazem Other (See Comments)  ? Passed out  ? ?  ? ?  ?Medication List  ?  ? ?  ? Accurate as of October 05, 2021  2:27 PM. If you have any questions, ask your nurse or doctor.  ?  ?  ? ?  ? ?albuterol 108 (90 Base) MCG/ACT inhaler ?Commonly known as: Proventil HFA ?TAKE 2 PUFFS BY MOUTH EVERY 6 HOURS AS NEEDED FOR WHEEZE OR SHORTNESS OF BREATH ?  ?atorvastatin 10 MG tablet ?Commonly known as: LIPITOR ?TAKE 1 TABLET BY MOUTH EVERY DAY ?  ?Contour Next Test test strip ?Generic drug: glucose blood ?Check blood sugar once daily ?  ?Dalvance 500 MG Solr ?Generic drug: dalbavancin ?Inject 500 mg into the vein every 14 (fourteen) days. Pt states this is a every 2 week antibiotic. Could not state how long therapy should be. ?  ?dextromethorphan-guaiFENesin 30-600 MG 12hr tablet ?Commonly known as: Lynch DM ?Take 1 tablet by mouth 2 (two) times daily as needed for cough. ?  ?insulin glargine 100 UNIT/ML Solostar Pen ?Commonly known as: LANTUS ?Inject 10 Units into the skin daily. ?  ?metFORMIN 1000 MG tablet ?Commonly known as: GLUCOPHAGE ?TAKE 1 TABLET (1,000 MG TOTAL) BY MOUTH 2 (TWO) TIMES DAILY WITH A MEAL. ?  ?metoprolol tartrate 50 MG tablet ?Commonly known as: LOPRESSOR ?Take 2 tablets (100 mg total) by mouth once for 1 dose.  Take 2 hr prior to scan ?  ?oxyCODONE-acetaminophen 5-325 MG tablet ?Commonly known as: PERCOCET/ROXICET ?Take 1 tablet by mouth every 4 (four) hours as needed for moderate pain. ?  ?pantoprazole 40 MG tablet ?Commonly known as: PROTONIX ?TAKE 1 TABLET BY MOUTH TWICE A DAY BEFORE A MEAL ?What changed: See the new instructions. ?  ?Pen Needles 3/16" 31G X 5 MM Misc ?Use with Lantus ?  ?predniSONE 10 MG tablet ?Commonly known as: DELTASONE ?Take 1 tablet (10 mg total) by mouth daily with breakfast. ?  ?pregabalin 75 MG capsule ?Commonly known as: LYRICA ?TAKE 1 CAPSULE BY MOUTH TWICE A DAY ?  ?sitaGLIPtin 50 MG tablet ?Commonly known as: Januvia ?Take 1 tablet (50 mg total) by mouth daily. ?  ?Stiolto Respimat 2.5-2.5 MCG/ACT Aers ?Generic drug: Tiotropium Bromide-Olodaterol ?Inhale 2 puffs into the lungs daily. ?  ?tamsulosin 0.4 MG Caps capsule ?Commonly known as: FLOMAX ?Take 2 capsules (0.8 mg total) by mouth daily. ?What changed: when to take this ?  ?tiZANidine 4 MG tablet ?Commonly known as: ZANAFLEX ?TAKE 1.5 TABLETS (6 MG TOTAL) BY MOUTH EVERY 6 (SIX) HOURS AS NEEDED FOR MUSCLE SPASMS. ?  ?venlafaxine XR 150 MG 24 hr capsule ?Commonly known as: EFFEXOR-XR ?Take 1 capsule (150 mg total) by mouth daily with breakfast. ?  ?warfarin 5 MG tablet ?Commonly known as: COUMADIN ?Take  as directed by the anticoagulation clinic. If you are unsure how to take this medication, talk to your nurse or doctor. ?Original instructions: Take 1-2 tablets Daily or as prescribed by Coumadin Clinic ?  ?zolpidem 5 MG tablet ?Commonly known as: AMBIEN ?Take 1 tablet (5 mg total) by mouth at bedtime as needed. for sleep ?  ? ?  ? ? ?Allergies:  ?Allergies  ?Allergen Reactions  ? Daptomycin   ?  Drug induced pneumonia   ? Diltiazem Other (See Comments)  ?  Passed out  ? ? ?Past Medical History, Surgical history, Social history, and Family History were reviewed and updated. ? ?Review of Systems: ?All other 10 point review of systems is  negative.  ? ?Physical Exam: ? vitals were not taken for this visit.  ? ?Wt Readings from Last 3 Encounters:  ?09/30/21 171 lb 6.4 oz (77.7 kg)  ?09/28/21 171 lb 6.4 oz (77.7 kg)  ?09/27/21 170 lb (77.1 kg)  ? ? ?Ocular: Sclerae unicteric, pupils equal, round and reactive to light ?Ear-nose-throat: Oropharynx clear, dentition fair ?Lymphatic: No cervical or supraclavicular adenopathy ?Lungs no rales or rhonchi, good excursion bilaterally ?Heart regular rate and rhythm, no murmur appreciated ?Abd soft, nontender, positive bowel sounds ?MSK no focal spinal tenderness, no joint edema ?Neuro: non-focal, well-oriented, appropriate affect ?Breasts: Deferred  ? ?Lab Results  ?Component Value Date  ? WBC 9.2 09/28/2021  ? HGB 12.1 (L) 09/28/2021  ? HCT 36.2 (L) 09/28/2021  ? MCV 87.1 09/28/2021  ? PLT 350.0 09/28/2021  ? ?Lab Results  ?Component Value Date  ? FERRITIN 17 (L) 04/22/2019  ? IRON 92 01/05/2021  ? TIBC 329 04/22/2019  ? IRONPCTSAT 8 (L) 04/22/2019  ? ?Lab Results  ?Component Value Date  ? RBC 4.16 (L) 09/28/2021  ? ?No results found for: KPAFRELGTCHN, LAMBDASER, KAPLAMBRATIO ?Lab Results  ?Component Value Date  ? IGGSERUM 773 06/24/2019  ? IGMSERUM 7 (L) 06/24/2019  ? ?No results found for: TOTALPROTELP, ALBUMINELP, A1GS, A2GS, BETS, BETA2SER, GAMS, MSPIKE, SPEI ?  Chemistry   ?   ?Component Value Date/Time  ? NA 126 (L) 08/16/2021 1451  ? NA 138 02/01/2021 0835  ? K 4.9 08/16/2021 1451  ? CL 88 (L) 08/16/2021 1451  ? CO2 31 08/16/2021 1451  ? BUN 12 08/16/2021 1451  ? BUN 12 02/01/2021 0835  ? CREATININE 0.74 08/16/2021 1451  ? CREATININE 0.91 08/04/2021 1046  ? CREATININE 0.66 (L) 03/15/2021 1519  ?    ?Component Value Date/Time  ? CALCIUM 9.3 08/16/2021 1451  ? ALKPHOS 55 08/16/2021 1451  ? AST 15 08/16/2021 1451  ? AST 11 (L) 08/04/2021 1046  ? ALT 11 08/16/2021 1451  ? ALT 8 08/04/2021 1046  ? BILITOT 0.5 08/16/2021 1451  ? BILITOT 0.6 08/04/2021 1046  ?  ? ? ? ?Impression and Plan: Luis Brown is a very  pleasant 76 yo caucasian gentleman with recent diagnosis of breakthrough pulmonary emboli of the right upper and lower lobes with right heart strain while on Xarelto.  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 ? ?Lottie Dawson, NP ?3/21/20232:27 PM ? ?

## 2021-10-05 NOTE — Telephone Encounter (Signed)
Patient states he is returning a call from [facility] about his upcoming procedure.  ?

## 2021-10-05 NOTE — Progress Notes (Signed)
?Hematology and Oncology Follow Up Visit ? ?Luis Brown ?[id number] ?Jan 09, 1946 76 y.o. ?10/05/2021 ? ? 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 ?Past Therapy: ?Xarelto - developed DVT while taking ?Lovenox - bruising ? ?Current Therapy:   ?Coumadin managed by cardiology and coumadin clinic  ?  ?Interim History:  Luis Brown is here today with his wife for follow-up. He is doing well but still has some SOB at times. He had pneumonia back in January right after we saw him.  ?He is scheduled for atrial fibrillation ablation on 10/12/2021 with Dr. Curt Bears.  ?He is scheduled for Cardiac CT tomorrow.  ?He is now on coumadin due to bruising with Lovenox and is tolerating well. He goes for INR checks with the coumadin clinic and dose is managed by cardiology.  ?He states that he is on 1L [place] supplemental O2 while sleeping.  ?He had bilateral pulmonary nodules noted on CT angio in February stable in appearance to compared CT from 2021. He states that pulmonology Dr. Chase Caller is following.  ?No fever, chills, n/v, cough, rash, dizziness, chest pain, palpitations, abdominal pain or changes in bowel or bladder habits.  ?No swelling in his extremities at this time.  ?Numbness and tingling in his feet is unchanged from baseline.  ?No falls or syncope to report.  ?He is eating well and doing his best to stay well hydrated. His weight is stable at 175 lbs.  ? ?ECOG Performance Status: 1 - Symptomatic but completely ambulatory ? ?Medications:  ?Allergies as of 10/05/2021   ? ?   Reactions  ? Daptomycin   ? Drug induced pneumonia   ? Diltiazem Other (See Comments)  ? Passed out  ? ?  ? ?  ?Medication List  ?  ? ?  ? Accurate as of October 05, 2021  3:14 PM. If you have any questions, ask your nurse or doctor.  ?  ?  ? ?  ? ?albuterol 108 (90 Base) MCG/ACT inhaler ?Commonly known as: Proventil HFA ?TAKE 2 PUFFS BY  MOUTH EVERY 6 HOURS AS NEEDED FOR WHEEZE OR SHORTNESS OF BREATH ?  ?atorvastatin 10 MG tablet ?Commonly known as: LIPITOR ?TAKE 1 TABLET BY MOUTH EVERY DAY ?  ?Contour Next Test test strip ?Generic drug: glucose blood ?Check blood sugar once daily ?  ?Dalvance 500 MG Solr ?Generic drug: dalbavancin ?Inject 500 mg into the vein every 14 (fourteen) days. Pt states this is a every 2 week antibiotic. Could not state how long therapy should be. ?  ?dextromethorphan-guaiFENesin 30-600 MG 12hr tablet ?Commonly known as: [place] DM ?Take 1 tablet by mouth 2 (two) times daily as needed for cough. ?  ?insulin glargine 100 UNIT/ML Solostar Pen ?Commonly known as: LANTUS ?Inject 10 Units into the skin daily. ?  ?metFORMIN 1000 MG tablet ?Commonly known as: GLUCOPHAGE ?TAKE 1 TABLET (1,000 MG TOTAL) BY MOUTH 2 (TWO) TIMES DAILY WITH A MEAL. ?  ?metoprolol tartrate 50 MG tablet ?Commonly known as: LOPRESSOR ?Take 2 tablets (100 mg total) by mouth once for 1 dose. Take 2 hr prior to scan ?  ?oxyCODONE-acetaminophen 5-325 MG tablet ?Commonly known as: PERCOCET/ROXICET ?Take 1 tablet by mouth every 4 (four) hours as needed for moderate pain. ?  ?pantoprazole 40 MG tablet ?Commonly known as: PROTONIX ?TAKE 1 TABLET BY MOUTH TWICE A DAY BEFORE A MEAL ?What changed: See the new instructions. ?  ?Pen  Needles 3/16" 31G X 5 MM Misc ?Use with Lantus ?  ?predniSONE 10 MG tablet ?Commonly known as: DELTASONE ?Take 1 tablet (10 mg total) by mouth daily with breakfast. ?  ?pregabalin 75 MG capsule ?Commonly known as: LYRICA ?TAKE 1 CAPSULE BY MOUTH TWICE A DAY ?  ?sitaGLIPtin 50 MG tablet ?Commonly known as: Januvia ?Take 1 tablet (50 mg total) by mouth daily. ?  ?Stiolto Respimat 2.5-2.5 MCG/ACT Aers ?Generic drug: Tiotropium Bromide-Olodaterol ?Inhale 2 puffs into the lungs daily. ?  ?tamsulosin 0.4 MG Caps capsule ?Commonly known as: FLOMAX ?Take 2 capsules (0.8 mg total) by mouth daily. ?What changed: when to take this ?  ?tiZANidine 4 MG  tablet ?Commonly known as: ZANAFLEX ?TAKE 1.5 TABLETS (6 MG TOTAL) BY MOUTH EVERY 6 (SIX) HOURS AS NEEDED FOR MUSCLE SPASMS. ?  ?venlafaxine XR 150 MG 24 hr capsule ?Commonly known as: EFFEXOR-XR ?Take 1 capsule (150 mg total) by mouth daily with breakfast. ?  ?warfarin 5 MG tablet ?Commonly known as: COUMADIN ?Take as directed by the anticoagulation clinic. If you are unsure how to take this medication, talk to your nurse or doctor. ?Original instructions: Take 1-2 tablets Daily or as prescribed by Coumadin Clinic ?  ?zolpidem 5 MG tablet ?Commonly known as: AMBIEN ?Take 1 tablet (5 mg total) by mouth at bedtime as needed. for sleep ?  ? ?  ? ? ?Allergies:  ?Allergies  ?Allergen Reactions  ? Daptomycin   ?  Drug induced pneumonia   ? Diltiazem Other (See Comments)  ?  Passed out  ? ? ?Past Medical History, Surgical history, Social history, and Family History were reviewed and updated. ? ?Review of Systems: ?All other 10 point review of systems is negative.  ? ?Physical Exam: ? weight is 175 lb 12.8 oz (79.7 kg). His oral temperature is 97.8 ?F (36.6 ?C). His blood pressure is 137/74 and his pulse is 82. His respiration is 18 and oxygen saturation is 98%.  ? ?Wt Readings from Last 3 Encounters:  ?10/05/21 175 lb 12.8 oz (79.7 kg)  ?09/30/21 171 lb 6.4 oz (77.7 kg)  ?09/28/21 171 lb 6.4 oz (77.7 kg)  ? ? ?Ocular: Sclerae unicteric, pupils equal, round and reactive to light ?Ear-nose-throat: Oropharynx clear, dentition fair ?Lymphatic: No cervical or supraclavicular adenopathy ?Lungs no rales or rhonchi, good excursion bilaterally ?Heart regular rate and rhythm, no murmur appreciated ?Abd soft, nontender, positive bowel sounds ?MSK no focal spinal tenderness, no joint edema ?Neuro: non-focal, well-oriented, appropriate affect ?Breasts: Deferred  ? ?Lab Results  ?Component Value Date  ? WBC 7.3 10/05/2021  ? HGB 11.9 (L) 10/05/2021  ? HCT 36.5 (L) 10/05/2021  ? MCV 88.4 10/05/2021  ? PLT 322 10/05/2021  ? ?Lab Results   ?Component Value Date  ? FERRITIN 17 (L) 04/22/2019  ? IRON 92 01/05/2021  ? TIBC 329 04/22/2019  ? IRONPCTSAT 8 (L) 04/22/2019  ? ?Lab Results  ?Component Value Date  ? RBC 4.13 (L) 10/05/2021  ? ?No results found for: KPAFRELGTCHN, LAMBDASER, KAPLAMBRATIO ?Lab Results  ?Component Value Date  ? IGGSERUM 773 06/24/2019  ? IGMSERUM 7 (L) 06/24/2019  ? ?No results found for: TOTALPROTELP, ALBUMINELP, A1GS, A2GS, BETS, BETA2SER, GAMS, MSPIKE, SPEI ?  Chemistry   ?   ?Component Value Date/Time  ? NA 126 (L) 08/16/2021 1451  ? NA 138 02/01/2021 0835  ? K 4.9 08/16/2021 1451  ? CL 88 (L) 08/16/2021 1451  ? CO2 31 08/16/2021 1451  ? BUN 12 08/16/2021 1451  ?  BUN 12 02/01/2021 0835  ? CREATININE 0.74 08/16/2021 1451  ? CREATININE 0.91 08/04/2021 1046  ? CREATININE 0.66 (L) 03/15/2021 1519  ?    ?Component Value Date/Time  ? CALCIUM 9.3 08/16/2021 1451  ? ALKPHOS 55 08/16/2021 1451  ? AST 15 08/16/2021 1451  ? AST 11 (L) 08/04/2021 1046  ? ALT 11 08/16/2021 1451  ? ALT 8 08/04/2021 1046  ? BILITOT 0.5 08/16/2021 1451  ? BILITOT 0.6 08/04/2021 1046  ?  ? ? ? ?Impression and Plan: Mr. Coffield is a very pleasant 76 yo caucasian gentleman with recent diagnosis of breakthrough pulmonary emboli of the right upper and lower lobes with right heart strain while on Xarelto.  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 ?Hyper coag panel was negative and he has permanently discontinued hormone replacement therapy.  ?He is tolerating Coumadin nicely and continues to follow-up with cardiology closely prior to ablation.  ?At this time we will just follow-up as needed. He will let us know if clearance is needed for any surgery or if he experiences another thrombotic event.  ? ?Lottie Dawson, NP ?3/21/20233:14 PM ? ?

## 2021-10-06 ENCOUNTER — Ambulatory Visit: Payer: Medicare Other | Admitting: Family

## 2021-10-06 ENCOUNTER — Inpatient Hospital Stay: Payer: Medicare Other

## 2021-10-06 ENCOUNTER — Telehealth: Payer: Self-pay | Admitting: *Deleted

## 2021-10-06 ENCOUNTER — Ambulatory Visit (HOSPITAL_COMMUNITY)
Admission: RE | Admit: 2021-10-06 | Discharge: 2021-10-06 | Disposition: A | Discharge status: Home / Self Care | Payer: Medicare Other | Source: Ambulatory Visit | Attending: Cardiology | Admitting: Cardiology

## 2021-10-06 DIAGNOSIS — I4891 Unspecified atrial fibrillation: Secondary | ICD-10-CM | POA: Diagnosis not present

## 2021-10-06 MED ORDER — IOHEXOL 350 MG/ML SOLN
100.0000 mL | Freq: Once | INTRAVENOUS | Status: AC | PRN
  Administered 2021-10-06: 100 mL via INTRAVENOUS

## 2021-10-06 NOTE — Telephone Encounter (Signed)
Per 10/05/21 los - Follow-up as needed.  ?

## 2021-10-06 NOTE — Telephone Encounter (Signed)
Attempted to reach Pt. ? ?Pt was scheduled to see Dr. Curt Bears on 10/12/21, same day as ablation. ? ?Moved Pt to 10/11/21 at [facility]. ? ?Sent mychart message advising Pt. ? ?Will continue to follow. ?

## 2021-10-06 NOTE — Telephone Encounter (Signed)
MR, please advise on pt email. Pt is requesting results of blood work. Thanks.  ?

## 2021-10-07 ENCOUNTER — Other Ambulatory Visit: Payer: Self-pay

## 2021-10-07 ENCOUNTER — Ambulatory Visit (INDEPENDENT_AMBULATORY_CARE_PROVIDER_SITE_OTHER): Payer: Medicare Other

## 2021-10-07 ENCOUNTER — Other Ambulatory Visit: Payer: Self-pay | Admitting: Medical

## 2021-10-07 DIAGNOSIS — Z7901 Long term (current) use of anticoagulants: Secondary | ICD-10-CM | POA: Diagnosis not present

## 2021-10-07 DIAGNOSIS — G062 Extradural and subdural abscess, unspecified: Secondary | ICD-10-CM | POA: Diagnosis not present

## 2021-10-07 DIAGNOSIS — I480 Paroxysmal atrial fibrillation: Secondary | ICD-10-CM

## 2021-10-07 DIAGNOSIS — M4325 Fusion of spine, thoracolumbar region: Secondary | ICD-10-CM | POA: Diagnosis not present

## 2021-10-07 DIAGNOSIS — M4635 Infection of intervertebral disc (pyogenic), thoracolumbar region: Secondary | ICD-10-CM | POA: Diagnosis not present

## 2021-10-07 LAB — POCT INR: INR: 2.4 (ref 2.0–3.0)

## 2021-10-07 NOTE — Patient Instructions (Signed)
Continue taking 1.5 tablets Daily, except 1 tablet Wednesday. INR in 3 weeks.  Ablation 3/28 ? ?[phone] ?

## 2021-10-08 DIAGNOSIS — M4635 Infection of intervertebral disc (pyogenic), thoracolumbar region: Secondary | ICD-10-CM | POA: Diagnosis not present

## 2021-10-08 DIAGNOSIS — M4325 Fusion of spine, thoracolumbar region: Secondary | ICD-10-CM | POA: Diagnosis not present

## 2021-10-08 NOTE — Telephone Encounter (Addendum)
Requesting: Ambien '5mg'$   ?Contract: 01/25/2018 ?UDS: 07/17/2021 ?Last Visit: 09/27/21 ?Next Visit: None ?Last Refill: 09/08/2021 #30 and 0RF ? ?Please Advise ? ?Refill of ambien sent to pharmacy. Would you arrange for nurse visit to sign updated contract this week. ? ?Thanks ? ?

## 2021-10-08 NOTE — Telephone Encounter (Signed)
?Blood allergy workup done for history of seasonal allergies - is normal ? ?Recent anemia better ? ? ?Plan ? - continue prednisone and rset of plan from recent OV ? ?Thanks ? ? ? ?SIGNATURE  ? ? ?Dr. Brand Males, M.D., F.C.C.P,  ?Pulmonary and Critical Care Medicine ?Staff Physician, [place] ?Center Director - Interstitial Lung Disease  Program  ?[facility] ?[place], [place], [address] ? ?NPI Number:  NPI #[id number] ?DEA Number: [id number] ? ?Pager: [phone], If no answer  -> Check AMION or Try [phone] ?Telephone (clinical office): [phone] ?Telephone (research): [phone] ? ?5:21 PM ?10/08/2021 ? ? ? Latest Reference Range & Units 09/28/21 11:12 09/29/21 11:42 10/05/21 14:29  ?Sodium 135 - 145 mmol/L   131 (L)  ?Potassium 3.5 - 5.1 mmol/L   4.3  ?Chloride 98 - 111 mmol/L   92 (L)  ?CO2 22 - 32 mmol/L   30  ?Glucose 70 - 99 mg/dL   241 (H)  ?BUN 8 - 23 mg/dL   10  ?Creatinine 0.61 - 1.24 mg/dL   0.85  ?Calcium 8.9 - 10.3 mg/dL   9.2  ?Anion gap 5 - 15    9  ?Alkaline Phosphatase 38 - 126 U/L   63  ?Albumin 3.5 - 5.0 g/dL   4.0  ?AST 15 - 41 U/L   19  ?ALT 0 - 44 U/L   13  ?Total Protein 6.5 - 8.1 g/dL   6.6  ?Total Bilirubin 0.3 - 1.2 mg/dL   0.8  ?GFR, Est Non African American >60 mL/min   >60  ?Interpretation  Pend    ?WBC 4.0 - 10.5 K/uL 9.2  7.3  ?RBC 4.22 - 5.81 MIL/uL 4.16 (L)  4.13 (L)  ?Hemoglobin 13.0 - 17.0 g/dL 12.1 (L)  11.9 (L)  ?HCT 39.0 - 52.0 % 36.2 (L)  36.5 (L)  ?MCV 80.0 - 100.0 fL 87.1  88.4  ?MCH 26.0 - 34.0 pg   28.8  ?MCHC 30.0 - 36.0 g/dL 33.4  32.6  ?RDW 11.5 - 15.5 % 16.7 (H)  15.1  ?Platelets 150 - 400 K/uL 350.0  322  ?nRBC 0.0 - 0.2 %   0.0  ?Neutrophils % 84.2 (H)  73  ?Lymphocytes % 4.9 (L) (C)  11  ?Monocytes Relative % 9.1  13  ?Eosinophil % 1.5  2  ?Basophil % 0.3  1  ?Immature Granulocytes %   0  ?NEUT# 1.7 - 7.7 K/uL 7.7  5.3  ?Lymphocyte # 0.7 - 4.0 K/uL 0.5 (L)  0.8  ?Monocyte # 0.1 - 1.0  K/uL 0.8  0.9  ?Eosinophils Absolute 0.0 - 0.5 K/uL 0.1  0.2  ?Basophils Absolute 0.0 - 0.1 K/uL 0.0  0.1  ?Abs Immature Granulocytes 0.00 - 0.07 K/uL   0.03  ?INR 2.0 - 3.0   2.4   ?Sheep Sorrel IgE kU/L <0.10    ?Pecan/Hickory Tree IgE kU/L <0.10    ?IgE (Immunoglobulin E), Serum <OR=114 kU/L 3    ?Allergen, D pternoyssinus,d7 kU/L <0.10    ?Cat Dander kU/L <0.10    ?Dog Dander kU/L <0.10    ?[place] Grass kU/L <0.10    ?[place] Grass kU/L <0.10    ?Timothy Grass kU/L <0.10    ?Cockroach kU/L <0.10    ?Aspergillus fumigatus, m3 kU/L <0.10    ?Allergen, Comm Silver Wendee Copp, t9 kU/L <0.10    ?Allergen, [place]wood, t14 kU/L <0.10    ?  Elm IgE kU/L <0.10    ?Allergen, Mulberry, t76 kU/L <0.10    ?Allergen, Oak,t7 kU/L <0.10    ?COMMON RAGWEED (SHORT) (W1) IGE kU/L <0.10    ?Allergen, Mouse Urine Protein, e78 kU/L <0.10    ?D. farinae kU/L <0.10    ?Allergen, Cedar tree, t12 kU/L <0.10    ?Box Elder IgE kU/L <0.10    ?Rough Pigweed  IgE kU/L <0.10    ?Allergen, A. alternata, m6 kU/L <0.10    ?Allergen, P. notatum, m1 kU/L <0.10    ?CLADOSPORIUM HERBARUM (M2) IGE kU/L <0.10    ?Class  0 ?0 ?0 ?0 ?0 ?0 ?0 ?0 ?0 ?0 ?0 ?0 ?0 ?0 ?0 ?0 ?0 ?0 ?0 ?0 ?0 ?0 ?0 ?0    ?(L): Data is abnormally low ?(H): Data is abnormally high ?(C): Corrected ?

## 2021-10-09 ENCOUNTER — Encounter: Payer: Self-pay | Admitting: Internal Medicine

## 2021-10-09 LAB — ALPHA-1 ANTITRYPSIN PHENOTYPE: A-1 Antitrypsin, Ser: 168 mg/dL (ref 83–199)

## 2021-10-09 MED ORDER — ZOLPIDEM TARTRATE 5 MG PO TABS: 5.0000 mg | ORAL_TABLET | Freq: Every evening | ORAL | 0 refills | Status: DC | PRN

## 2021-10-11 ENCOUNTER — Ambulatory Visit: Payer: Medicare Other | Admitting: Cardiology

## 2021-10-11 ENCOUNTER — Encounter (HOSPITAL_COMMUNITY): Payer: Self-pay | Admitting: Certified Registered Nurse Anesthetist

## 2021-10-11 ENCOUNTER — Encounter: Payer: Self-pay | Admitting: Cardiology

## 2021-10-11 ENCOUNTER — Other Ambulatory Visit: Payer: Self-pay

## 2021-10-11 VITALS — BP 112/62 | HR 106 | Ht 70.0 in | Wt 173.0 lb

## 2021-10-11 DIAGNOSIS — D6869 Other thrombophilia: Secondary | ICD-10-CM | POA: Diagnosis not present

## 2021-10-11 DIAGNOSIS — I480 Paroxysmal atrial fibrillation: Secondary | ICD-10-CM

## 2021-10-11 NOTE — H&P (View-Only) (Signed)
? ?Electrophysiology Office Note ? ? ?Date:  10/11/2021  ? ?ID:  Luis Brown, DOB 08/05/1945, MRN [id number] ? ?PCP:  Mackie Pai, PA-C  ?Cardiologist:  [place] ?Primary Electrophysiologist:  Bliss Tsang Meredith Leeds, MD   ? ?Chief Complaint: AF ?  ?History of Present Illness: ?Luis Brown is a 76 y.o. male who is being seen today for the evaluation of AF at the request of Saguier, Percell Miller, [place]. Presenting today for electrophysiology evaluation. ? ?He has a history significant for atrial fibrillation, hypertension, diabetes, hyperlipidemia.  He has had multiple episodes of atrial fibrillation with rapid rates.  He feels quite poorly when he is in atrial fibrillation.  He wore a Zio monitor with an atrial fibrillation burden of 3%.  He is planned for ablation 10/12/2021. ? ?Today, denies symptoms of palpitations, chest pain, shortness of breath, orthopnea, PND, lower extremity edema, claudication, dizziness, presyncope, syncope, bleeding, or neurologic sequela. The patient is tolerating medications without difficulties.  Since being seen he has done well.  His atrial fibrillation burden has gone down.  Despite that he is ready for his ablation tomorrow. ? ? ?Past Medical History:  ?Diagnosis Date  ? Acute pharyngitis 10/21/2013  ? Allergy   ? grass and pollen  ? Anxiety and depression 10/25/2011  ? BPH (benign prostatic hyperplasia) 04/23/2012  ? Chicken pox as a child  ? DDD (degenerative disc disease)   ? cervical responds to steroid injections and low back required surgery  ? DDD (degenerative disc disease), lumbosacral   ? Diabetes mellitus   ? pre  ? Dyspnea   ? ED (erectile dysfunction) 04/23/2012  ? Elevated BP   ? 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 Esophageal reflux 02/10/2015  ? Fatigue   ? HTN (hypertension)   ? Hyperglycemia   ? preDM   ? Hyperlipidemia   ? Insomnia   ? Low back pain radiating to both legs 01/16/2017  ? Measles as a child  ? Overweight(278.02)   ? Peripheral neuropathy 08/07/2019  ? Personal history  of colonic polyps 10/27/2012  ? Follows with [facility]  ? Pneumonia   ? " walking"  ? Preventative health care   ? Testosterone deficiency 05/23/2012  ? Wears glasses   ? ?Past Surgical History:  ?Procedure Laterality Date  ? BACK SURGERY  2012 and 1994  ? Dr Margret Chance, screws and cage in low back  ? BIOPSY  01/09/2020  ? Procedure: BIOPSY;  Surgeon: Otis Brace, MD;  Location: [facility];  Service: Gastroenterology;;  ? BRONCHIAL BIOPSY  11/17/2020  ? Procedure: BRONCHIAL BIOPSIES;  Surgeon: Laurin Coder, MD;  Location: [facility];  Service: Endoscopy;;  ? BRONCHIAL BRUSHINGS  11/17/2020  ? Procedure: BRONCHIAL BRUSHINGS;  Surgeon: Laurin Coder, MD;  Location: [facility];  Service: Endoscopy;;  ? BRONCHIAL WASHINGS  11/17/2020  ? Procedure: BRONCHIAL WASHINGS;  Surgeon: Laurin Coder, MD;  Location: [facility];  Service: Endoscopy;;  ? COLONOSCOPY WITH PROPOFOL N/A 01/09/2020  ? Procedure: COLONOSCOPY WITH PROPOFOL;  Surgeon: Otis Brace, MD;  Location: [facility];  Service: Gastroenterology;  Laterality: N/A;  ? ESOPHAGOGASTRODUODENOSCOPY (EGD) WITH PROPOFOL N/A 01/09/2020  ? Procedure: ESOPHAGOGASTRODUODENOSCOPY (EGD) WITH PROPOFOL;  Surgeon: Otis Brace, MD;  Location: [facility];  Service: Gastroenterology;  Laterality: N/A;  ? EYE SURGERY Bilateral   ? 2016  ? HARDWARE REVISION  03/12/2019  ? REVISION OF HARDWARE THORACIC TEN-THORACIC ELEVEN WITH APPLICATION OF ADDITIONAL RODS AND ROD SLEEVES THORACIC EIGHT-THORACIC TWELVE 12-LUMBAR ONE (N/A )  ?  HEMOSTASIS CONTROL  11/17/2020  ? Procedure: HEMOSTASIS CONTROL;  Surgeon: Laurin Coder, MD;  Location: [facility];  Service: Endoscopy;;  ? IR US GUIDE BX ASP/DRAIN  11/13/2020  ? LAMINECTOMY  02/12/2019  ? DECOMPRESSIVE LAMINECTOMY THORACIC NINE-THORACIC TEN AND THORACIC TEN-THORACIC ELEVEN, EXTENSION OF THORACOLUMBAR FUSION FROM THORACIC TEN TO THORACIC FIVE, LOCAL BONE GRAFT, ALLOGRAFT AND VIVIGEN (N/A)  ?  POLYPECTOMY  01/09/2020  ? Procedure: POLYPECTOMY;  Surgeon: Otis Brace, MD;  Location: [facility];  Service: Gastroenterology;;  ? REMOVAL OF RODS AND PEDICLE SCREWS T5 AND T6, EXPLORATION OF FUSION, BIOPSY TRANSPEDICULAR T5 AND T6 (N/A )  09/14/2020  ? SPINAL FUSION N/A 02/12/2019  ? Procedure: DECOMPRESSIVE LAMINECTOMY THORACIC NINE-THORACIC TEN AND THORACIC TEN-THORACIC ELEVEN, EXTENSION OF THORACOLUMBAR FUSION FROM THORACIC TEN TO THORACIC FIVE, LOCAL BONE GRAFT, ALLOGRAFT AND VIVIGEN;  Surgeon: Jessy Oto, MD;  Location: [facility];  Service: Orthopedics;  Laterality: N/A;  DECOMPRESSIVE LAMINECTOMY THORACIC NINE-THORACIC TEN AND THORACIC TEN-THORACIC ELEVEN, EXTENSION OF TH  ? SPINAL FUSION N/A 03/12/2019  ? Procedure: REVISION OF HARDWARE THORACIC TEN-THORACIC ELEVEN WITH APPLICATION OF ADDITIONAL RODS AND ROD SLEEVES THORACIC EIGHT-THORACIC TWELVE 12-LUMBAR ONE;  Surgeon: Jessy Oto, MD;  Location: [facility];  Service: Orthopedics;  Laterality: N/A;  ? TONSILLECTOMY    ? as child  ? torn rotator cuff  2010  ? right  ? VIDEO BRONCHOSCOPY N/A 11/17/2020  ? Procedure: VIDEO BRONCHOSCOPY WITH FLUORO;  Surgeon: Laurin Coder, MD;  Location: [facility];  Service: Endoscopy;  Laterality: N/A;  ? ? ? ?Current Outpatient Medications  ?Medication Sig Dispense Refill  ? albuterol (PROVENTIL HFA) 108 (90 Base) MCG/ACT inhaler TAKE 2 PUFFS BY MOUTH EVERY 6 HOURS AS NEEDED FOR WHEEZE OR SHORTNESS OF BREATH 8 g 5  ? atorvastatin (LIPITOR) 10 MG tablet TAKE 1 TABLET BY MOUTH EVERY DAY 90 tablet 1  ? DALVANCE 500 MG SOLR Inject 500 mg into the vein every 14 (fourteen) days. Pt states this is a every 2 week antibiotic. Could not state how long therapy should be.    ? dextromethorphan-guaiFENesin (MUCINEX DM) 30-600 MG 12hr tablet Take 1 tablet by mouth 2 (two) times daily.    ? glucose blood (CONTOUR NEXT TEST) test strip Check blood sugar once daily 100 each 12  ? insulin glargine (LANTUS) 100 UNIT/ML Solostar Pen  Inject 10 Units into the skin daily. 15 mL 0  ? Insulin Pen Needle (PEN NEEDLES 3/16") 31G X 5 MM MISC Use with Lantus 100 each 0  ? metFORMIN (GLUCOPHAGE) 1000 MG tablet TAKE 1 TABLET (1,000 MG TOTAL) BY MOUTH 2 (TWO) TIMES DAILY WITH A MEAL. 180 tablet 0  ? oxyCODONE-acetaminophen (PERCOCET/ROXICET) 5-325 MG tablet Take 1 tablet by mouth every 4 (four) hours as needed for moderate pain.    ? pantoprazole (PROTONIX) 40 MG tablet TAKE 1 TABLET BY MOUTH TWICE A DAY BEFORE A MEAL (Patient taking differently: 40 mg 2 (two) times daily before a meal.) 180 tablet 1  ? predniSONE (DELTASONE) 10 MG tablet Take 1 tablet (10 mg total) by mouth daily with breakfast. 30 tablet 3  ? pregabalin (LYRICA) 75 MG capsule TAKE 1 CAPSULE BY MOUTH TWICE A DAY (Patient taking differently: Take 75 mg by mouth 2 (two) times daily.) 60 capsule 3  ? sitaGLIPtin (JANUVIA) 50 MG tablet Take 1 tablet (50 mg total) by mouth daily. 30 tablet 3  ? STIOLTO RESPIMAT 2.5-2.5 MCG/ACT AERS Inhale 2 puffs into the lungs daily. 1 each  5  ? tamsulosin (FLOMAX) 0.4 MG CAPS capsule Take 2 capsules (0.8 mg total) by mouth daily. (Patient taking differently: Take 0.8 mg by mouth at bedtime.) 180 capsule 3  ? tiZANidine (ZANAFLEX) 4 MG tablet TAKE 1.5 TABLETS (6 MG TOTAL) BY MOUTH EVERY 6 (SIX) HOURS AS NEEDED FOR MUSCLE SPASMS. (Patient taking differently: Take 4 mg by mouth every 4 (four) hours.) 540 tablet 1  ? venlafaxine XR (EFFEXOR-XR) 150 MG 24 hr capsule Take 1 capsule (150 mg total) by mouth daily with breakfast. 90 capsule 0  ? warfarin (COUMADIN) 5 MG tablet Take 1-2 tablets Daily or as prescribed by Coumadin Clinic (Patient taking differently: Take 7.5 mg by mouth in the morning. or as prescribed by Coumadin Clinic) 60 tablet 0  ? zolpidem (AMBIEN) 5 MG tablet Take 1 tablet (5 mg total) by mouth at bedtime as needed. for sleep 30 tablet 0  ? ?Current Facility-Administered Medications  ?Medication Dose Route Frequency Provider Last Rate Last Admin   ? testosterone cypionate (DEPOTESTOSTERONE CYPIONATE) injection 200 mg  200 mg Intramuscular Q14 Days Saguier, Percell Miller, PA-C   200 mg at 07/06/21 2542  ? ? ?Allergies:   Daptomycin and Diltiazem  ? ?Social H

## 2021-10-11 NOTE — Telephone Encounter (Signed)
Dr. Chase Caller, please advise on pt's email regarding his cardiac CT from 10/06/21. Thanks.  ?

## 2021-10-11 NOTE — Pre-Procedure Instructions (Signed)
Instructed patient on the following items: ?Arrival time 0530 ?Nothing to eat or drink after midnight ?No meds AM of procedure ?Responsible person to drive you home and stay with you for 24 hrs ? ?Have you missed any doses of anti-coagulant Coumadin-hasn't missed any doses ?   ?

## 2021-10-11 NOTE — Progress Notes (Signed)
? ?Electrophysiology Office Note ? ? ?Date:  10/11/2021  ? ?ID:  Luis Brown, DOB 03/23/1946, MRN [id number] ? ?PCP:  Mackie Pai, PA-C  ?Cardiologist:  [place] ?Primary Electrophysiologist:  Jaculin Rasmus Meredith Leeds, MD   ? ?Chief Complaint: AF ?  ?History of Present Illness: ?Luis Brown is a 76 y.o. male who is being seen today for the evaluation of AF at the request of Luis Brown, Luis Brown, [place]. Presenting today for electrophysiology evaluation. ? ?He has a history significant for atrial fibrillation, hypertension, diabetes, hyperlipidemia.  He has had multiple episodes of atrial fibrillation with rapid rates.  He feels quite poorly when he is in atrial fibrillation.  He wore a Zio monitor with an atrial fibrillation burden of 3%.  He is planned for ablation 10/12/2021. ? ?Today, denies symptoms of palpitations, chest pain, shortness of breath, orthopnea, PND, lower extremity edema, claudication, dizziness, presyncope, syncope, bleeding, or neurologic sequela. The patient is tolerating medications without difficulties.  Since being seen he has done well.  His atrial fibrillation burden has gone down.  Despite that he is ready for his ablation tomorrow. ? ? ?Past Medical History:  ?Diagnosis Date  ? Acute pharyngitis 10/21/2013  ? Allergy   ? grass and pollen  ? Anxiety and depression 10/25/2011  ? BPH (benign prostatic hyperplasia) 04/23/2012  ? Chicken pox as a child  ? DDD (degenerative disc disease)   ? cervical responds to steroid injections and low back required surgery  ? DDD (degenerative disc disease), lumbosacral   ? Diabetes mellitus   ? pre  ? Dyspnea   ? ED (erectile dysfunction) 04/23/2012  ? Elevated BP   ? 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 Esophageal reflux 02/10/2015  ? Fatigue   ? HTN (hypertension)   ? Hyperglycemia   ? preDM   ? Hyperlipidemia   ? Insomnia   ? Low back pain radiating to both legs 01/16/2017  ? Measles as a child  ? Overweight(278.02)   ? Peripheral neuropathy 08/07/2019  ? Personal history  of colonic polyps 10/27/2012  ? Follows with [facility]  ? Pneumonia   ? " walking"  ? Preventative health care   ? Testosterone deficiency 05/23/2012  ? Wears glasses   ? ?Past Surgical History:  ?Procedure Laterality Date  ? BACK SURGERY  2012 and 1994  ? Dr Margret Chance, screws and cage in low back  ? BIOPSY  01/09/2020  ? Procedure: BIOPSY;  Surgeon: Otis Brace, MD;  [facility];  Service: Gastroenterology;;  ? BRONCHIAL BIOPSY  11/17/2020  ? Procedure: BRONCHIAL BIOPSIES;  Surgeon: Laurin Coder, MD;  [facility];  Service: Endoscopy;;  ? BRONCHIAL BRUSHINGS  11/17/2020  ? Procedure: BRONCHIAL BRUSHINGS;  Surgeon: Laurin Coder, MD;  [facility];  Service: Endoscopy;;  ? BRONCHIAL WASHINGS  11/17/2020  ? Procedure: BRONCHIAL WASHINGS;  Surgeon: Laurin Coder, MD;  [facility];  Service: Endoscopy;;  ? COLONOSCOPY WITH PROPOFOL N/A 01/09/2020  ? Procedure: COLONOSCOPY WITH PROPOFOL;  Surgeon: Otis Brace, MD;  [facility];  Service: Gastroenterology;  Laterality: N/A;  ? ESOPHAGOGASTRODUODENOSCOPY (EGD) WITH PROPOFOL N/A 01/09/2020  ? Procedure: ESOPHAGOGASTRODUODENOSCOPY (EGD) WITH PROPOFOL;  Surgeon: Otis Brace, MD;  [facility];  Service: Gastroenterology;  Laterality: N/A;  ? EYE SURGERY Bilateral   ? 2016  ? HARDWARE REVISION  03/12/2019  ? REVISION OF HARDWARE THORACIC TEN-THORACIC ELEVEN WITH APPLICATION OF ADDITIONAL RODS AND ROD SLEEVES THORACIC EIGHT-THORACIC TWELVE 12-LUMBAR ONE (N/A )  ?  HEMOSTASIS CONTROL  11/17/2020  ? Procedure: HEMOSTASIS CONTROL;  Surgeon: Laurin Coder, MD;  [facility];  Service: Endoscopy;;  ? IR US GUIDE BX ASP/DRAIN  11/13/2020  ? LAMINECTOMY  02/12/2019  ? DECOMPRESSIVE LAMINECTOMY THORACIC NINE-THORACIC TEN AND THORACIC TEN-THORACIC ELEVEN, EXTENSION OF THORACOLUMBAR FUSION FROM THORACIC TEN TO THORACIC FIVE, LOCAL BONE GRAFT, ALLOGRAFT AND VIVIGEN (N/A)  ?  POLYPECTOMY  01/09/2020  ? Procedure: POLYPECTOMY;  Surgeon: Otis Brace, MD;  [facility];  Service: Gastroenterology;;  ? REMOVAL OF RODS AND PEDICLE SCREWS T5 AND T6, EXPLORATION OF FUSION, BIOPSY TRANSPEDICULAR T5 AND T6 (N/A )  09/14/2020  ? SPINAL FUSION N/A 02/12/2019  ? Procedure: DECOMPRESSIVE LAMINECTOMY THORACIC NINE-THORACIC TEN AND THORACIC TEN-THORACIC ELEVEN, EXTENSION OF THORACOLUMBAR FUSION FROM THORACIC TEN TO THORACIC FIVE, LOCAL BONE GRAFT, ALLOGRAFT AND VIVIGEN;  Surgeon: Jessy Oto, MD;  Location: [facility];  Service: Orthopedics;  Laterality: N/A;  DECOMPRESSIVE LAMINECTOMY THORACIC NINE-THORACIC TEN AND THORACIC TEN-THORACIC ELEVEN, EXTENSION OF TH  ? SPINAL FUSION N/A 03/12/2019  ? Procedure: REVISION OF HARDWARE THORACIC TEN-THORACIC ELEVEN WITH APPLICATION OF ADDITIONAL RODS AND ROD SLEEVES THORACIC EIGHT-THORACIC TWELVE 12-LUMBAR ONE;  Surgeon: Jessy Oto, MD;  Location: [facility];  Service: Orthopedics;  Laterality: N/A;  ? TONSILLECTOMY    ? as child  ? torn rotator cuff  2010  ? right  ? VIDEO BRONCHOSCOPY N/A 11/17/2020  ? Procedure: VIDEO BRONCHOSCOPY WITH FLUORO;  Surgeon: Laurin Coder, MD;  [facility];  Service: Endoscopy;  Laterality: N/A;  ? ? ? ?Current Outpatient Medications  ?Medication Sig Dispense Refill  ? albuterol (PROVENTIL HFA) 108 (90 Base) MCG/ACT inhaler TAKE 2 PUFFS BY MOUTH EVERY 6 HOURS AS NEEDED FOR WHEEZE OR SHORTNESS OF BREATH 8 g 5  ? atorvastatin (LIPITOR) 10 MG tablet TAKE 1 TABLET BY MOUTH EVERY DAY 90 tablet 1  ? DALVANCE 500 MG SOLR Inject 500 mg into the vein every 14 (fourteen) days. Pt states this is a every 2 week antibiotic. Could not state how long therapy should be.    ? dextromethorphan-guaiFENesin (MUCINEX DM) 30-600 MG 12hr tablet Take 1 tablet by mouth 2 (two) times daily.    ? glucose blood (CONTOUR NEXT TEST) test strip Check blood sugar once daily 100 each 12  ? insulin glargine (LANTUS) 100 UNIT/ML Solostar Pen  Inject 10 Units into the skin daily. 15 mL 0  ? Insulin Pen Needle (PEN NEEDLES 3/16") 31G X 5 MM MISC Use with Lantus 100 each 0  ? metFORMIN (GLUCOPHAGE) 1000 MG tablet TAKE 1 TABLET (1,000 MG TOTAL) BY MOUTH 2 (TWO) TIMES DAILY WITH A MEAL. 180 tablet 0  ? oxyCODONE-acetaminophen (PERCOCET/ROXICET) 5-325 MG tablet Take 1 tablet by mouth every 4 (four) hours as needed for moderate pain.    ? pantoprazole (PROTONIX) 40 MG tablet TAKE 1 TABLET BY MOUTH TWICE A DAY BEFORE A MEAL (Patient taking differently: 40 mg 2 (two) times daily before a meal.) 180 tablet 1  ? predniSONE (DELTASONE) 10 MG tablet Take 1 tablet (10 mg total) by mouth daily with breakfast. 30 tablet 3  ? pregabalin (LYRICA) 75 MG capsule TAKE 1 CAPSULE BY MOUTH TWICE A DAY (Patient taking differently: Take 75 mg by mouth 2 (two) times daily.) 60 capsule 3  ? sitaGLIPtin (JANUVIA) 50 MG tablet Take 1 tablet (50 mg total) by mouth daily. 30 tablet 3  ? STIOLTO RESPIMAT 2.5-2.5 MCG/ACT AERS Inhale 2 puffs into the lungs daily. 1 each  5  ? tamsulosin (FLOMAX) 0.4 MG CAPS capsule Take 2 capsules (0.8 mg total) by mouth daily. (Patient taking differently: Take 0.8 mg by mouth at bedtime.) 180 capsule 3  ? tiZANidine (ZANAFLEX) 4 MG tablet TAKE 1.5 TABLETS (6 MG TOTAL) BY MOUTH EVERY 6 (SIX) HOURS AS NEEDED FOR MUSCLE SPASMS. (Patient taking differently: Take 4 mg by mouth every 4 (four) hours.) 540 tablet 1  ? venlafaxine XR (EFFEXOR-XR) 150 MG 24 hr capsule Take 1 capsule (150 mg total) by mouth daily with breakfast. 90 capsule 0  ? warfarin (COUMADIN) 5 MG tablet Take 1-2 tablets Daily or as prescribed by [facility] (Patient taking differently: Take 7.5 mg by mouth in the morning. or as prescribed by [facility]) 60 tablet 0  ? zolpidem (AMBIEN) 5 MG tablet Take 1 tablet (5 mg total) by mouth at bedtime as needed. for sleep 30 tablet 0  ? ?Current Facility-Administered Medications  ?Medication Dose Route Frequency Provider Last Rate Last Admin   ? testosterone cypionate (DEPOTESTOSTERONE CYPIONATE) injection 200 mg  200 mg Intramuscular Q14 Days Luis Brown, Luis Miller, PA-C   200 mg at 07/06/21 5929  ? ? ?Allergies:   Daptomycin and Diltiazem  ? ?Social H

## 2021-10-12 ENCOUNTER — Encounter (HOSPITAL_COMMUNITY)
Admission: RE | Disposition: A | Discharge status: Home / Self Care | Payer: Medicare Other | Source: Ambulatory Visit | Attending: Cardiology

## 2021-10-12 ENCOUNTER — Ambulatory Visit: Payer: Medicare Other | Admitting: Cardiology

## 2021-10-12 ENCOUNTER — Ambulatory Visit (HOSPITAL_COMMUNITY)
Admission: RE | Admit: 2021-10-12 | Discharge: 2021-10-12 | Disposition: A | Discharge status: Home / Self Care | Payer: Medicare Other | Source: Ambulatory Visit | Attending: Cardiology | Admitting: Cardiology

## 2021-10-12 ENCOUNTER — Encounter (HOSPITAL_COMMUNITY): Payer: Self-pay | Admitting: Cardiology

## 2021-10-12 DIAGNOSIS — Z539 Procedure and treatment not carried out, unspecified reason: Secondary | ICD-10-CM | POA: Diagnosis not present

## 2021-10-12 DIAGNOSIS — Z7901 Long term (current) use of anticoagulants: Secondary | ICD-10-CM | POA: Insufficient documentation

## 2021-10-12 DIAGNOSIS — I480 Paroxysmal atrial fibrillation: Secondary | ICD-10-CM | POA: Diagnosis not present

## 2021-10-12 DIAGNOSIS — I4891 Unspecified atrial fibrillation: Secondary | ICD-10-CM | POA: Diagnosis present

## 2021-10-12 DIAGNOSIS — E785 Hyperlipidemia, unspecified: Secondary | ICD-10-CM | POA: Insufficient documentation

## 2021-10-12 DIAGNOSIS — E119 Type 2 diabetes mellitus without complications: Secondary | ICD-10-CM | POA: Diagnosis not present

## 2021-10-12 DIAGNOSIS — Z794 Long term (current) use of insulin: Secondary | ICD-10-CM | POA: Diagnosis not present

## 2021-10-12 DIAGNOSIS — D6869 Other thrombophilia: Secondary | ICD-10-CM | POA: Diagnosis not present

## 2021-10-12 DIAGNOSIS — Z7984 Long term (current) use of oral hypoglycemic drugs: Secondary | ICD-10-CM | POA: Diagnosis not present

## 2021-10-12 DIAGNOSIS — I10 Essential (primary) hypertension: Secondary | ICD-10-CM | POA: Insufficient documentation

## 2021-10-12 DIAGNOSIS — Z79899 Other long term (current) drug therapy: Secondary | ICD-10-CM | POA: Diagnosis not present

## 2021-10-12 LAB — GLUCOSE, CAPILLARY
Glucose-Capillary: 381 mg/dL — ABNORMAL HIGH (ref 70–99)
Glucose-Capillary: 339 mg/dL — ABNORMAL HIGH (ref 70–99)

## 2021-10-12 LAB — PROTIME-INR
INR: 2.6 — ABNORMAL HIGH (ref 0.8–1.2)
Prothrombin Time: 27.9 seconds — ABNORMAL HIGH (ref 11.4–15.2)

## 2021-10-12 SURGERY — ATRIAL FIBRILLATION ABLATION
Anesthesia: General

## 2021-10-12 MED ORDER — SODIUM CHLORIDE 0.9 % IV SOLN: INTRAVENOUS | Status: DC

## 2021-10-12 MED ORDER — ALBUTEROL SULFATE (2.5 MG/3ML) 0.083% IN NEBU
INHALATION_SOLUTION | RESPIRATORY_TRACT | Status: AC
  Filled 2021-10-12: qty 3

## 2021-10-12 MED ORDER — ALBUTEROL SULFATE (2.5 MG/3ML) 0.083% IN NEBU
2.5000 mg | INHALATION_SOLUTION | Freq: Once | RESPIRATORY_TRACT | Status: AC
  Administered 2021-10-12: 2.5 mg via RESPIRATORY_TRACT

## 2021-10-12 MED ORDER — INSULIN ASPART 100 UNIT/ML IJ SOLN
10.0000 [IU] | Freq: Once | INTRAMUSCULAR | Status: AC
  Administered 2021-10-12: 10 [IU] via SUBCUTANEOUS
  Filled 2021-10-12: qty 1

## 2021-10-12 NOTE — Telephone Encounter (Signed)
To me the CT MArch 2023 and compariing Dec 2023 arre different types of CT - but I think current one is improved ? ?Plan ? - PFT and OV In march 2023 ?

## 2021-10-12 NOTE — Anesthesia Preprocedure Evaluation (Deleted)
Anesthesia Evaluation  ? ? ?Reviewed: ?Allergy & Precautions, Patient's Chart, lab work & pertinent test results ? ?Airway ? ? ? ? ? ? ? Dental ?  ?Pulmonary ?asthma , COPD, former smoker,  ?  ? ? ? ? ? ? ? Cardiovascular ?hypertension, + dysrhythmias Atrial Fibrillation  ? ? ?  ?Neuro/Psych ?PSYCHIATRIC DISORDERS Anxiety Depression   ? GI/Hepatic ?Neg liver ROS, PUD, GERD  ,  ?Endo/Other  ?diabetes ? Renal/GU ?negative Renal ROS  ? ?  ?Musculoskeletal ? ?(+) Arthritis ,  ? Abdominal ?  ?Peds ? Hematology ?negative hematology ROS ?(+)   ?Anesthesia Other Findings ? ? Reproductive/Obstetrics ? ?  ? ? ? ? ? ? ? ? ? ? ? ? ? ?  ?  ? ? ? ? ? ? ? ? ?Anesthesia Physical ?Anesthesia Plan ? ?ASA: 3 ? ?Anesthesia Plan: General  ? ?Post-op Pain Management:   ? ?Induction: Intravenous ? ?PONV Risk Score and Plan: 2 and Ondansetron and Dexamethasone ? ?Airway Management Planned: Oral ETT ? ?Additional Equipment: None ? ?Intra-op Plan:  ? ?Post-operative Plan: Extubation in OR ? ?Informed Consent:  ? ?Plan Discussed with: CRNA ? ?Anesthesia Plan Comments: (Luis Brown arrived with a blood glucose of approx. 400. He states he does not check his blood sugar daily. In addition, he is currently coughing with an inspiratory & expiratory wheeze throughout. Patients states he is still recovering from Pneumonia. We will administer 10 units of insulin, breathing treatment and perform diabetes education. Luis Brown will be postponed until he is better optimized for the semi-elective procedure. )  ? ? ? ? ? ?Anesthesia Quick Evaluation ? ?

## 2021-10-12 NOTE — Interval H&P Note (Signed)
History and Physical Interval Note: ? ?10/12/2021 ?7:02 AM ? ?Luis Brown  has presented today for surgery, with the diagnosis of afib.  The various methods of treatment have been discussed with the patient and family. After consideration of risks, benefits and other options for treatment, the patient has consented to  Procedure(s): ?[place] (N/A) as a surgical intervention.  The patient's history has been reviewed, patient examined, no change in status, stable for surgery.  I have reviewed the patient's chart and labs.  Questions were answered to the patient's satisfaction.   ? ? ?[name] ? ? ?

## 2021-10-13 NOTE — Telephone Encounter (Signed)
Pt had OV with PFT March 2023 and is scheduled to have a follow up May 2023. ?

## 2021-10-15 ENCOUNTER — Encounter: Payer: Self-pay | Admitting: Orthopedic Surgery

## 2021-10-15 ENCOUNTER — Ambulatory Visit: Payer: Medicare Other | Admitting: Orthopedic Surgery

## 2021-10-15 VITALS — Ht 70.0 in | Wt 175.0 lb

## 2021-10-15 DIAGNOSIS — M7542 Impingement syndrome of left shoulder: Secondary | ICD-10-CM | POA: Diagnosis not present

## 2021-10-15 NOTE — Progress Notes (Signed)
? ?Office Visit Note ?  ?Patient: Luis Brown           ?Date of Birth: 1946-01-01           ?MRN: [id number] ?Visit Date: 10/15/2021 ?Requested by: Mackie Pai, PA-C ?[facility] ?[address] ?[place],  [place] [address] ?PCP: Mackie Pai, PA-C ? ?Subjective: ?Chief Complaint  ?Patient presents with  ? Left Shoulder - Pain  ? ? ?HPI: I is a 76 year old patient with left shoulder pain.  Had a biceps rupture about 6 weeks ago.  That is gotten better.  Patient is on anticoagulants.  A little bit of soreness with activity yesterday.  He was raking some yards.  He is very careful with his activity level. ?             ?ROS: All systems reviewed are negative as they relate to the chief complaint within the history of present illness.  Patient denies  fevers or chills. ? ? ?Assessment & Plan: ?Visit Diagnoses:  ?1. Impingement syndrome of left shoulder   ? ? ?Plan: Impression is left shoulder pain with biceps rupture and that pain has improved.  Has some significant tendinosis of the remaining rotator cuff tendons which is not surprising.  No full-thickness tear is present and no significant retraction is present.  Plan at this time is observation and activity modification.  No indication for further intervention at this time.  Follow-up as needed.  Signs and symptoms of significant rotator cuff pathology are discussed. ? ?Follow-Up Instructions: No follow-ups on file.  ? ?Orders:  ?No orders of the defined types were placed in this encounter. ? ?No orders of the defined types were placed in this encounter. ? ? ? ? Procedures: ?No procedures performed ? ? ?Clinical Data: ?No additional findings. ? ?Objective: ?Vital Signs: Ht '5\' 10"'$  (1.778 m)   Wt 175 lb (79.4 kg)   BMI 25.11 kg/m?  ? ?Physical Exam:  ? ?Constitutional: Patient appears well-developed ?HEENT:  ?Head: Normocephalic ?Eyes:EOM are normal ?Neck: Normal range of motion ?Cardiovascular: Normal rate ?Pulmonary/chest: Effort normal ?Neurologic:  Patient is alert ?Skin: Skin is warm ?Psychiatric: Patient has normal mood and affect ? ? 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  Motor or sensory function to the hand is intact.  Patient has passive range of motion of 70/105/170.  Good rotator cuff strength infraspinatus supraspinatus and subscap muscle testing.  No masses lymphadenopathy or skin changes noted in the shoulder region.  No coarse grinding or crepitus noted. ? ?Specialty Comments:  ?No specialty comments available. ? ?Imaging: ?No results found. ? ? ?PMFS History: ?Patient Active Problem List  ? Diagnosis Date Noted  ? Atrial fibrillation ([place]) 09/10/2021  ? Long term (current) use of anticoagulants 09/10/2021  ? Diabetes mellitus due to underlying condition with hyperosmolarity without coma, with long-term current use of insulin ([place]) 09/10/2021  ? Mixed hyperlipidemia 09/10/2021  ? Aspiration pneumonitis ([place]) 08/07/2021  ? Chronic respiratory failure with hypoxia ([place]) 08/07/2021  ? Pulmonary emboli ([place]) 07/16/2021  ? Bronchiectasis with acute exacerbation ([place]) 07/16/2021  ? CAP (community acquired pneumonia) 07/15/2021  ? History of DVT (deep vein thrombosis) 07/15/2021  ? Paroxysmal A-fib ([place]) 07/15/2021  ? COPD (chronic obstructive pulmonary disease) ([place]) 02/10/2021  ? Palpitations 02/01/2021  ? Wears glasses 01/11/2021  ? Pneumothorax, right 12/15/2020  ? Anxiety 12/01/2020  ? SOB (shortness of breath)   ? Acute respiratory failure with hypoxia ([place]) 11/14/2020  ? Drug-induced pneumonitis  11/10/2020  ? S/P bronchoscopy 09/14/2020  ? Gastric ulcer 01/27/2020  ? H/O colonoscopy with polypectomy 01/27/2020  ? Protein-calorie malnutrition, severe 01/09/2020  ? Abdominal pain 12/30/2019  ? Nausea and vomiting 12/30/2019  ? Weight loss, abnormal 12/30/2019  ? Change in bowel habits 12/30/2019  ? Medication monitoring encounter 08/26/2019  ? Peripheral neuropathy 08/07/2019  ? Hyponatremia 05/05/2019  ? 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 Fusion of spine of thoracolumbar region 03/12/2019  ? Status post thoracic spinal fusion 02/12/2019  ? Thoracic spinal stenosis 04/17/2017  ? Painful orthopaedic hardware ([place]) 04/17/2017  ? 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  ? 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 ([place]) 04/17/2017  ? Low back pain radiating to both legs 01/16/2017  ? Esophageal reflux 02/10/2015  ? Essential hypertension 06/25/2014  ? Asthma 06/25/2014  ? 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 Overweight 06/25/2014  ? Other abnormal glucose 06/25/2014  ? Spinal stenosis of lumbar region without neurogenic claudication 04/09/2014  ? Flatback syndrome 04/09/2014  ? Chicken pox 10/21/2013  ? History of colonic polyps 10/27/2012  ? Testosterone deficiency 05/23/2012  ? 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 Benign enlargement of prostate 04/23/2012  ? ED (erectile dysfunction) 04/23/2012  ? Allergic state 10/25/2011  ? 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 Diabetes mellitus with hyperglycemia ([place])   ? Insomnia   ? Fatigue   ? Preventative health care   ? DDD (degenerative disc disease), lumbosacral   ? Thoracic degenerative disc disease   ? ?Past Medical History:  ?Diagnosis Date  ? Acute pharyngitis 10/21/2013  ? Allergy   ? grass and pollen  ? Anxiety and depression 10/25/2011  ? BPH (benign prostatic hyperplasia) 04/23/2012  ? Chicken pox as a child  ? DDD (degenerative disc disease)   ? cervical responds to steroid injections and low back required surgery  ? DDD (degenerative disc disease), lumbosacral   ? Diabetes mellitus   ? pre  ? Dyspnea   ? ED (erectile dysfunction) 04/23/2012  ? Elevated BP   ? 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 Esophageal reflux 02/10/2015  ? Fatigue   ? HTN (hypertension)   ? Hyperglycemia   ? preDM   ? Hyperlipidemia   ? Insomnia   ? Low back pain radiating to both legs 01/16/2017  ? Measles as a child  ? Overweight(278.02)   ? Peripheral neuropathy  08/07/2019  ? Personal history of colonic polyps 10/27/2012  ? Follows with [facility]  ? Pneumonia   ? " walking"  ? Preventative health care   ? Testosterone deficiency 05/23/2012  ? Wears glasses   ?  ?Family History  ?Problem Relation Age of Onset  ? Hypertension Mother   ? Diabetes Mother   ?     type 2  ? Cancer Mother 55  ?     breast in remission  ? Emphysema Father   ?     smoker  ? COPD Father   ?     smoker  ? Stroke Father 89  ?     mini  ? Heart disease Father   ? Hypertension Sister   ? Hyperlipidemia Sister   ? Scoliosis Sister   ? Osteoporosis Sister   ? Hypertension Maternal Grandmother   ? Scoliosis Maternal Grandmother   ? Heart disease Maternal Grandfather   ? Heart disease Paternal Grandfather   ?     smoker  ? Heart disease Daughter   ?  ?Past Surgical History:  ?  Procedure Laterality Date  ? BACK SURGERY  2012 and 1994  ? Dr Margret Chance, screws and cage in low back  ? BIOPSY  01/09/2020  ? Procedure: BIOPSY;  Surgeon: Otis Brace, MD;  [facility];  Service: Gastroenterology;;  ? BRONCHIAL BIOPSY  11/17/2020  ? Procedure: BRONCHIAL BIOPSIES;  Surgeon: Laurin Coder, MD;  [facility];  Service: Endoscopy;;  ? BRONCHIAL BRUSHINGS  11/17/2020  ? Procedure: BRONCHIAL BRUSHINGS;  Surgeon: Laurin Coder, MD;  [facility];  Service: Endoscopy;;  ? BRONCHIAL WASHINGS  11/17/2020  ? Procedure: BRONCHIAL WASHINGS;  Surgeon: Laurin Coder, MD;  [facility];  Service: Endoscopy;;  ? COLONOSCOPY WITH PROPOFOL N/A 01/09/2020  ? Procedure: COLONOSCOPY WITH PROPOFOL;  Surgeon: Otis Brace, MD;  [facility];  Service: Gastroenterology;  Laterality: N/A;  ? ESOPHAGOGASTRODUODENOSCOPY (EGD) WITH PROPOFOL N/A 01/09/2020  ? Procedure: ESOPHAGOGASTRODUODENOSCOPY (EGD) WITH PROPOFOL;  Surgeon: Otis Brace, MD;  [facility];  Service: Gastroenterology;  Laterality: N/A;  ? EYE SURGERY Bilateral   ? 2016  ? HARDWARE REVISION   03/12/2019  ? REVISION OF HARDWARE THORACIC TEN-THORACIC ELEVEN WITH APPLICATION OF ADDITIONAL RODS AND ROD SLEEVES THORACIC EIGHT-THORACIC TWELVE 12-LUMBAR ONE (N/A )  ? HEMOSTASIS CONTROL  11/17/2020  ? Procedure: HEMOSTASI

## 2021-10-16 ENCOUNTER — Other Ambulatory Visit: Payer: Self-pay | Admitting: Cardiology

## 2021-10-16 DIAGNOSIS — I480 Paroxysmal atrial fibrillation: Secondary | ICD-10-CM

## 2021-10-18 ENCOUNTER — Other Ambulatory Visit: Payer: Self-pay | Admitting: Medical

## 2021-10-20 ENCOUNTER — Encounter: Payer: Self-pay | Admitting: Internal Medicine

## 2021-10-21 ENCOUNTER — Encounter: Payer: Self-pay | Admitting: Cardiology

## 2021-10-21 ENCOUNTER — Other Ambulatory Visit: Payer: Self-pay | Admitting: Medical

## 2021-10-21 DIAGNOSIS — G062 Extradural and subdural abscess, unspecified: Secondary | ICD-10-CM | POA: Diagnosis not present

## 2021-10-21 DIAGNOSIS — M4325 Fusion of spine, thoracolumbar region: Secondary | ICD-10-CM | POA: Diagnosis not present

## 2021-10-21 DIAGNOSIS — M4635 Infection of intervertebral disc (pyogenic), thoracolumbar region: Secondary | ICD-10-CM | POA: Diagnosis not present

## 2021-10-22 DIAGNOSIS — R0602 Shortness of breath: Secondary | ICD-10-CM | POA: Diagnosis not present

## 2021-10-22 DIAGNOSIS — J9601 Acute respiratory failure with hypoxia: Secondary | ICD-10-CM | POA: Diagnosis not present

## 2021-10-26 ENCOUNTER — Encounter: Payer: Self-pay | Admitting: Cardiology

## 2021-10-26 DIAGNOSIS — Z01812 Encounter for preprocedural laboratory examination: Secondary | ICD-10-CM

## 2021-10-26 DIAGNOSIS — I480 Paroxysmal atrial fibrillation: Secondary | ICD-10-CM

## 2021-10-28 ENCOUNTER — Ambulatory Visit (INDEPENDENT_AMBULATORY_CARE_PROVIDER_SITE_OTHER): Payer: Medicare Other

## 2021-10-28 DIAGNOSIS — Z7901 Long term (current) use of anticoagulants: Secondary | ICD-10-CM

## 2021-10-28 DIAGNOSIS — I480 Paroxysmal atrial fibrillation: Secondary | ICD-10-CM

## 2021-10-28 LAB — POCT INR: INR: 2.3 (ref 2.0–3.0)

## 2021-10-28 NOTE — Patient Instructions (Signed)
Continue taking 1.5 tablets Daily, except 1 tablet Wednesday. INR in 5 weeks.  ? ?[phone] ?

## 2021-11-03 ENCOUNTER — Encounter: Payer: Self-pay | Admitting: Internal Medicine

## 2021-11-04 ENCOUNTER — Encounter: Payer: Self-pay | Admitting: *Deleted

## 2021-11-04 DIAGNOSIS — G062 Extradural and subdural abscess, unspecified: Secondary | ICD-10-CM | POA: Diagnosis not present

## 2021-11-04 DIAGNOSIS — M4325 Fusion of spine, thoracolumbar region: Secondary | ICD-10-CM | POA: Diagnosis not present

## 2021-11-04 DIAGNOSIS — M4635 Infection of intervertebral disc (pyogenic), thoracolumbar region: Secondary | ICD-10-CM | POA: Diagnosis not present

## 2021-11-04 NOTE — Telephone Encounter (Signed)
Procedure date of 7/13 held for ablation. ?Aware I will send out new instructions via mychart. ?Patient verbalized understanding and agreeable to plan.  ? ?

## 2021-11-04 NOTE — Addendum Note (Signed)
Addended by: Stanton Kidney on: 11/04/2021 11:39 AM ? ? Modules accepted: Orders ? ?

## 2021-11-05 ENCOUNTER — Encounter: Payer: Self-pay | Admitting: Medical

## 2021-11-05 DIAGNOSIS — M4635 Infection of intervertebral disc (pyogenic), thoracolumbar region: Secondary | ICD-10-CM | POA: Diagnosis not present

## 2021-11-05 DIAGNOSIS — M4324 Fusion of spine, thoracic region: Secondary | ICD-10-CM | POA: Diagnosis not present

## 2021-11-08 MED ORDER — STIOLTO RESPIMAT 2.5-2.5 MCG/ACT IN AERS: 2.0000 | INHALATION_SPRAY | Freq: Every day | RESPIRATORY_TRACT | 0 refills | Status: DC

## 2021-11-08 MED ORDER — ZOLPIDEM TARTRATE 5 MG PO TABS: 5.0000 mg | ORAL_TABLET | Freq: Every evening | ORAL | 0 refills | Status: DC | PRN

## 2021-11-09 ENCOUNTER — Ambulatory Visit (HOSPITAL_COMMUNITY): Payer: Medicare Other | Admitting: Nurse Practitioner

## 2021-11-09 NOTE — Telephone Encounter (Signed)
Spoke to pt about HRs & CT. ?Aware he will take Lopressor 100 mg once 2 hours prior to CT. ? ?

## 2021-11-17 ENCOUNTER — Other Ambulatory Visit: Payer: Self-pay | Admitting: Medical

## 2021-11-17 ENCOUNTER — Encounter: Payer: Self-pay | Admitting: Medical

## 2021-11-17 MED ORDER — "PEN NEEDLES 3/16"" 31G X 5 MM MISC": 0 refills | Status: DC

## 2021-11-18 DIAGNOSIS — G062 Extradural and subdural abscess, unspecified: Secondary | ICD-10-CM | POA: Diagnosis not present

## 2021-11-18 DIAGNOSIS — M4325 Fusion of spine, thoracolumbar region: Secondary | ICD-10-CM | POA: Diagnosis not present

## 2021-11-18 DIAGNOSIS — M4635 Infection of intervertebral disc (pyogenic), thoracolumbar region: Secondary | ICD-10-CM | POA: Diagnosis not present

## 2021-11-19 ENCOUNTER — Other Ambulatory Visit: Payer: Self-pay | Admitting: Medical

## 2021-11-21 DIAGNOSIS — J9601 Acute respiratory failure with hypoxia: Secondary | ICD-10-CM | POA: Diagnosis not present

## 2021-11-21 DIAGNOSIS — R0602 Shortness of breath: Secondary | ICD-10-CM | POA: Diagnosis not present

## 2021-11-23 ENCOUNTER — Encounter: Payer: Self-pay | Admitting: Medical

## 2021-11-23 MED ORDER — INSULIN GLARGINE 100 UNIT/ML SOLOSTAR PEN: 10.0000 [IU] | PEN_INJECTOR | Freq: Every day | SUBCUTANEOUS | 0 refills | Status: DC

## 2021-11-24 ENCOUNTER — Encounter: Payer: Self-pay | Admitting: Medical

## 2021-11-24 MED ORDER — SITAGLIPTIN PHOSPHATE 50 MG PO TABS: 50.0000 mg | ORAL_TABLET | Freq: Every day | ORAL | 3 refills | Status: DC

## 2021-11-29 ENCOUNTER — Telehealth: Payer: Self-pay

## 2021-11-29 ENCOUNTER — Encounter: Payer: Self-pay | Admitting: Medical

## 2021-11-29 NOTE — Telephone Encounter (Signed)
Patient called stating is last dose of IV antibiotics will be this week and he is not due to follow up with you until 12/21/21. Patient is asking do you want to extend IV antibiotics.  ?Please advise. ?Taisei Bonnette T Joram Venson ? ?

## 2021-11-29 NOTE — Telephone Encounter (Signed)
I have spoke to the patient and advised him we working with the pharmacy team on extending the IV infusions for the patient.  ?

## 2021-11-30 ENCOUNTER — Telehealth: Payer: Self-pay | Admitting: Pharmacist

## 2021-11-30 ENCOUNTER — Ambulatory Visit: Payer: Medicare Other | Admitting: Internal Medicine

## 2021-11-30 DIAGNOSIS — E119 Type 2 diabetes mellitus without complications: Secondary | ICD-10-CM | POA: Diagnosis not present

## 2021-11-30 LAB — HM DIABETES EYE EXAM

## 2021-11-30 MED ORDER — PANTOPRAZOLE SODIUM 40 MG PO TBEC: DELAYED_RELEASE_TABLET | ORAL | 1 refills | Status: DC

## 2021-11-30 NOTE — Telephone Encounter (Signed)
Spoke to [place] at [facility] and gave order to extend patient's q2 week dalbavancin infusions until 11/30 per Dr. Juleen China.  ? ?Homer Miller L. Misheel Gowans, PharmD ?[facility] Clinical Pharmacist Practitioner ?

## 2021-11-30 NOTE — Telephone Encounter (Signed)
Sure, no problem. How long do you want to extend?

## 2021-11-30 NOTE — Telephone Encounter (Signed)
Sounds good. All done.

## 2021-12-02 ENCOUNTER — Ambulatory Visit: Payer: Medicare Other

## 2021-12-02 DIAGNOSIS — M4635 Infection of intervertebral disc (pyogenic), thoracolumbar region: Secondary | ICD-10-CM | POA: Diagnosis not present

## 2021-12-02 DIAGNOSIS — M4325 Fusion of spine, thoracolumbar region: Secondary | ICD-10-CM | POA: Diagnosis not present

## 2021-12-02 DIAGNOSIS — G062 Extradural and subdural abscess, unspecified: Secondary | ICD-10-CM | POA: Diagnosis not present

## 2021-12-02 DIAGNOSIS — I480 Paroxysmal atrial fibrillation: Secondary | ICD-10-CM | POA: Diagnosis not present

## 2021-12-02 DIAGNOSIS — Z7901 Long term (current) use of anticoagulants: Secondary | ICD-10-CM

## 2021-12-02 LAB — POCT INR: INR: 3.9 — AB (ref 2.0–3.0)

## 2021-12-02 NOTE — Patient Instructions (Signed)
HOLD Friday and then Continue taking 1.5 tablets Daily, except 1 tablet Wednesday. INR in 4 weeks. Ablation 7/13 (1 of 4 Weekly INR checks)  [phone]

## 2021-12-03 ENCOUNTER — Telehealth: Payer: Self-pay

## 2021-12-03 NOTE — Telephone Encounter (Signed)
Patient aware and verbalized his understanding.    [place], CMA

## 2021-12-03 NOTE — Telephone Encounter (Signed)
RN at [facility] called to report critical lab on patient - glucose is 531.   [place], CMA

## 2021-12-08 ENCOUNTER — Other Ambulatory Visit: Payer: Self-pay | Admitting: Medical

## 2021-12-08 ENCOUNTER — Encounter: Payer: Self-pay | Admitting: Medical

## 2021-12-08 NOTE — Telephone Encounter (Addendum)
Requesting: Ambien '5mg'$   Contract:01/25/2018 UDS: 07/17/21 Last Visit: 09/27/21 Next Visit: None Last Refill: 11/08/21 #30 and 0RF  Please Advise  Not sure why his contract not up to date. Please arrange pt to sign controlled med contract within a week.  Thanks,

## 2021-12-09 ENCOUNTER — Other Ambulatory Visit: Payer: Self-pay | Admitting: Cardiology

## 2021-12-09 DIAGNOSIS — I480 Paroxysmal atrial fibrillation: Secondary | ICD-10-CM

## 2021-12-10 MED ORDER — ZOLPIDEM TARTRATE 5 MG PO TABS: 5.0000 mg | ORAL_TABLET | Freq: Every evening | ORAL | 0 refills | Status: DC | PRN

## 2021-12-11 ENCOUNTER — Other Ambulatory Visit: Payer: Self-pay | Admitting: Cardiology

## 2021-12-11 DIAGNOSIS — I480 Paroxysmal atrial fibrillation: Secondary | ICD-10-CM

## 2021-12-14 ENCOUNTER — Other Ambulatory Visit: Payer: Self-pay | Admitting: Cardiology

## 2021-12-14 DIAGNOSIS — I480 Paroxysmal atrial fibrillation: Secondary | ICD-10-CM

## 2021-12-14 NOTE — Telephone Encounter (Signed)
Pharmacy requesting 90 day supply   Prescription refill request received for warfarin [place]: 10/11/21 ([place])  Next INR check: 12/30/21 Warfarin tablet strength: '5mg'$    90 day supply sent to requested pharmacy.

## 2021-12-16 DIAGNOSIS — G062 Extradural and subdural abscess, unspecified: Secondary | ICD-10-CM | POA: Diagnosis not present

## 2021-12-16 DIAGNOSIS — M4325 Fusion of spine, thoracolumbar region: Secondary | ICD-10-CM | POA: Diagnosis not present

## 2021-12-16 DIAGNOSIS — M4635 Infection of intervertebral disc (pyogenic), thoracolumbar region: Secondary | ICD-10-CM | POA: Diagnosis not present

## 2021-12-21 ENCOUNTER — Other Ambulatory Visit: Payer: Self-pay

## 2021-12-21 ENCOUNTER — Ambulatory Visit (INDEPENDENT_AMBULATORY_CARE_PROVIDER_SITE_OTHER): Payer: Medicare Other | Admitting: Internal Medicine

## 2021-12-21 ENCOUNTER — Encounter: Payer: Self-pay | Admitting: Internal Medicine

## 2021-12-21 DIAGNOSIS — Z794 Long term (current) use of insulin: Secondary | ICD-10-CM

## 2021-12-21 DIAGNOSIS — M4624 Osteomyelitis of vertebra, thoracic region: Secondary | ICD-10-CM

## 2021-12-21 DIAGNOSIS — E0800 Diabetes mellitus due to underlying condition with hyperosmolarity without nonketotic hyperglycemic-hyperosmolar coma (NKHHC): Secondary | ICD-10-CM

## 2021-12-21 NOTE — Assessment & Plan Note (Addendum)
  Ike completed 8 weeks of tedizolid for his complicated spinal infection with hardware as documented in my note from 03/17/21.  He has been on dalbavancin '1000mg'$  every 2 weeks via PIV from [facility] as a suppressive regimen since 05/28/2021.  His inflammatory markers have normalized and been stable for several months and his chronic back pain is stable.  Will continue with suppressive regimen and planning for at least 12 months of suppression with clinical re-evaluation at that time.  Will follow up in about 4 months.

## 2021-12-21 NOTE — Progress Notes (Signed)
[facility] for Infectious Disease  CHIEF COMPLAINT:    Follow up for back infection  SUBJECTIVE:    Luis Brown is a 76 y.o. male with PMHx as below who presents to the clinic for back infection.   Luis Brown presents today for routine follow-up via telehealth.  He has a long history of spinal infection as noted in my note from 03/17/2021.  He was last seen by me on 09/21/21.    He completed 8 weeks of tedizolid for his spinal infection and was then started on dalbavancin infusions every 2 weeks as a suppressive regimen in November 2022.   He has been tolerating this regimen well without worsening back pain or fevers.  He has chronic pain that is currently being managed.  His OPAT labs have shown normalization of his inflammatory markers.  He continues to have issues with glycemic control.  His A1c was 10.2 in January 2023.  He is currently down at the beach in [place] with his wife.   Please see A&P for the details of today's visit and status of the patient's medical problems.   Patient's Medications  New Prescriptions   No medications on file  Previous Medications   ALBUTEROL (PROVENTIL HFA) 108 (90 BASE) MCG/ACT INHALER    TAKE 2 PUFFS BY MOUTH EVERY 6 HOURS AS NEEDED FOR WHEEZE OR SHORTNESS OF BREATH   ATORVASTATIN (LIPITOR) 10 MG TABLET    TAKE 1 TABLET BY MOUTH EVERY DAY   DALVANCE 500 MG SOLR    Inject 500 mg into the vein every 14 (fourteen) days. Pt states this is a every 2 week antibiotic. Could not state how long therapy should be.   DEXTROMETHORPHAN-GUAIFENESIN (MUCINEX DM) 30-600 MG 12HR TABLET    Take 1 tablet by mouth 2 (two) times daily.   GLUCOSE BLOOD (CONTOUR NEXT TEST) TEST STRIP    Check blood sugar once daily   INSULIN GLARGINE (LANTUS) 100 UNIT/ML SOLOSTAR PEN    Inject 10 Units into the skin daily.   INSULIN PEN NEEDLE (B-D UF III MINI PEN NEEDLES) 31G X 5 MM MISC    USE WITH LANTUS   METFORMIN (GLUCOPHAGE) 1000 MG TABLET    TAKE 1 TABLET  (1,000 MG TOTAL) BY MOUTH TWICE A DAY WITH FOOD   OXYCODONE-ACETAMINOPHEN (PERCOCET/ROXICET) 5-325 MG TABLET    Take 1 tablet by mouth every 4 (four) hours as needed for moderate pain.   PANTOPRAZOLE (PROTONIX) 40 MG TABLET    TAKE 1 TABLET BY MOUTH TWICE A DAY BEFORE A MEAL   PREDNISONE (DELTASONE) 10 MG TABLET    Take 1 tablet (10 mg total) by mouth daily with breakfast.   PREGABALIN (LYRICA) 75 MG CAPSULE    TAKE 1 CAPSULE BY MOUTH TWICE A DAY   SITAGLIPTIN (JANUVIA) 50 MG TABLET    Take 1 tablet (50 mg total) by mouth daily.   STIOLTO RESPIMAT 2.5-2.5 MCG/ACT AERS    Inhale 2 puffs into the lungs daily.   TAMSULOSIN (FLOMAX) 0.4 MG CAPS CAPSULE    Take 2 capsules (0.8 mg total) by mouth daily.   TIOTROPIUM BROMIDE-OLODATEROL (STIOLTO RESPIMAT) 2.5-2.5 MCG/ACT AERS    Inhale 2 puffs into the lungs daily.   TIZANIDINE (ZANAFLEX) 4 MG TABLET    TAKE 1.5 TABLETS (6 MG TOTAL) BY MOUTH EVERY 6 (SIX) HOURS AS NEEDED FOR MUSCLE SPASMS.   VENLAFAXINE XR (EFFEXOR-XR) 150 MG 24 HR CAPSULE    Take 1 capsule (  150 mg total) by mouth daily with breakfast.   WARFARIN (COUMADIN) 5 MG TABLET    TAKE 1 TO 2 TABLETS DAILY OR AS PRESCRIBED BY COUMADIN CLINIC   ZOLPIDEM (AMBIEN) 5 MG TABLET    Take 1 tablet (5 mg total) by mouth at bedtime as needed. 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   Esophageal reflux 02/10/2015   Fatigue    HTN (hypertension)     Hyperglycemia    preDM    Hyperlipidemia    Insomnia    Low back pain radiating to both legs 01/16/2017   Measles as a child   Overweight(278.02)    Peripheral neuropathy 08/07/2019   Personal history of colonic polyps 10/27/2012   Follows with [facility]   Pneumonia    " walking"   Preventative health care    Testosterone deficiency 05/23/2012   Wears glasses     Social History   Tobacco Use   Smoking status: Former    Packs/day: 1.00    Years: 20.00    Pack years: 20.00    Types: Cigarettes    Quit date: 07/18/1989    Years since quitting: 32.4   Smokeless tobacco: Never  Vaping Use   Vaping Use: Never used  Substance Use Topics   Alcohol use: Not Currently   Drug use: No    Family History  Problem Relation Age of Onset   Hypertension Mother    Diabetes Mother        type 2   Cancer Mother 61       breast in remission   Emphysema Father        smoker   COPD Father        smoker   Stroke Father 68       mini   Heart disease Father    Hypertension Sister    Hyperlipidemia Sister    Scoliosis Sister    Osteoporosis Sister    Hypertension Maternal Grandmother    Scoliosis Maternal Grandmother    Heart disease Maternal Grandfather    Heart disease Paternal Grandfather        smoker   Heart disease Daughter     Allergies  Allergen Reactions   Daptomycin     Drug induced pneumonia    Diltiazem Other (See Comments)    Passed out     OBJECTIVE:      Labs and Microbiology:    Latest Ref Rng & Units 10/05/2021    2:29 PM 09/28/2021   11:12 AM 08/16/2021    2:51 PM  CBC  WBC 4.0 - 10.5 K/uL 7.3   9.2   8.5    Hemoglobin 13.0 - 17.0 g/dL 11.9   12.1   12.9    Hematocrit 39.0 - 52.0 % 36.5   36.2   38.8  Platelets 150 - 400 K/uL 322   350.0   318.0        Latest Ref Rng & Units 10/05/2021    2:29 PM 08/16/2021    2:51 PM 08/08/2021    1:54 AM  CMP  Glucose 70 - 99 mg/dL 241   399   370    BUN 8 - 23 mg/dL '10   12   18    '$ Creatinine  0.61 - 1.24 mg/dL 0.85   0.74   0.69    Sodium 135 - 145 mmol/L 131   126   128    Potassium 3.5 - 5.1 mmol/L 4.3   4.9   4.2    Chloride 98 - 111 mmol/L 92   88   93    CO2 22 - 32 mmol/L '30   31   24    '$ Calcium 8.9 - 10.3 mg/dL 9.2   9.3   8.4    Total Protein 6.5 - 8.1 g/dL 6.6   6.3     Total Bilirubin 0.3 - 1.2 mg/dL 0.8   0.5     Alkaline Phos 38 - 126 U/L 63   55     AST 15 - 41 U/L 19   15     ALT 0 - 44 U/L 13   11          ASSESSMENT & PLAN:    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HCC)  Ike completed 8 weeks of tedizolid for his complicated spinal infection with hardware as documented in my note from 03/17/21.  He has been on dalbavancin '1000mg'$  every 2 weeks via PIV from [place] as a suppressive regimen since 05/28/2021.  His inflammatory markers have normalized and been stable for several months and his chronic back pain is stable.  Will continue with suppressive regimen and planning for at least 12 months of suppression with clinical re-evaluation at that time.  Will follow up in about 4 months.    Diabetes mellitus due to underlying condition with hyperosmolarity without coma, with long-term current use of insulin (Luis Brown) His OPAT labs have shown very elevated glucose and A1c is 10.2 in January. Advised patient to continue follow up with PCP for improved glycemic control.      Luis Brown for Infectious Disease [facility] 12/21/2021, 11:35 AM   Virtual Visit via Telephone Note   I connected with Luis Brown on 12/21/21 at 11:35 AM by telephone and verified that I am speaking with the correct person using two identifiers.   I discussed the limitations, risks, security and privacy concerns of performing an evaluation and management service by telephone and the availability of in person appointments. I also discussed with the patient that there may be a patient responsible charge related to this service. The patient  expressed understanding and agreed to proceed.  Patient location: [place] My location: [facility]  Duration of call: 7 min (31 min spent in chart today)

## 2021-12-21 NOTE — Assessment & Plan Note (Signed)
His OPAT labs have shown very elevated glucose and A1c is 10.2 in January. Advised patient to continue follow up with PCP for improved glycemic control.

## 2021-12-22 DIAGNOSIS — J9601 Acute respiratory failure with hypoxia: Secondary | ICD-10-CM | POA: Diagnosis not present

## 2021-12-22 DIAGNOSIS — R0602 Shortness of breath: Secondary | ICD-10-CM | POA: Diagnosis not present

## 2021-12-23 ENCOUNTER — Ambulatory Visit: Payer: Medicare Other | Admitting: Internal Medicine

## 2021-12-24 ENCOUNTER — Other Ambulatory Visit (HOSPITAL_COMMUNITY): Payer: Medicare Other

## 2021-12-26 ENCOUNTER — Encounter: Payer: Self-pay | Admitting: Medical

## 2021-12-27 ENCOUNTER — Telehealth: Payer: Self-pay

## 2021-12-27 ENCOUNTER — Encounter (HOSPITAL_COMMUNITY): Payer: Self-pay

## 2021-12-27 ENCOUNTER — Ambulatory Visit (HOSPITAL_COMMUNITY)
Admission: RE | Admit: 2021-12-27 | Discharge: 2021-12-27 | Disposition: A | Discharge status: Home / Self Care | Payer: Medicare Other | Source: Ambulatory Visit | Attending: Internal Medicine | Admitting: Internal Medicine

## 2021-12-27 DIAGNOSIS — J432 Centrilobular emphysema: Secondary | ICD-10-CM | POA: Diagnosis not present

## 2021-12-27 DIAGNOSIS — J849 Interstitial pulmonary disease, unspecified: Secondary | ICD-10-CM | POA: Insufficient documentation

## 2021-12-27 DIAGNOSIS — J84112 Idiopathic pulmonary fibrosis: Secondary | ICD-10-CM | POA: Diagnosis not present

## 2021-12-27 DIAGNOSIS — I700 Atherosclerosis of aorta: Secondary | ICD-10-CM | POA: Diagnosis not present

## 2021-12-27 DIAGNOSIS — J479 Bronchiectasis, uncomplicated: Secondary | ICD-10-CM | POA: Diagnosis not present

## 2021-12-27 MED ORDER — FAMCICLOVIR 500 MG PO TABS: 500.0000 mg | ORAL_TABLET | Freq: Three times a day (TID) | ORAL | 0 refills | Status: DC

## 2021-12-27 NOTE — Addendum Note (Signed)
Addended by: Anabel Halon on: 12/27/2021 08:42 PM   Modules accepted: Orders

## 2021-12-27 NOTE — Telephone Encounter (Signed)
Luis Brown awaiting order for PIV antibiotic Dalbavancin 1000 mg every 2 weeks. Placed order in his box to be signed and faxed back to [facility].

## 2021-12-28 NOTE — Telephone Encounter (Signed)
Received voicemail from [facility] with [facility]. They received dalbavancin orders, but she would like to know what labs provider needs and how frequently. Will route to provider and pharmacy team.   Ander Purpura phone: [phone]  Beryle Flock, RN

## 2021-12-28 NOTE — Telephone Encounter (Signed)
Order for IV Dalbavancin 1000 mg administered every 2 weeks via PIV (expected end date 05/2022) signed by Dr. Juleen China and faxed to [facility] at [phone] at [place] am. Successful receipt confirmed electronically.  Binnie Kand, RN

## 2021-12-28 NOTE — Telephone Encounter (Signed)
Spoke with Santiago Glad, Buyer, retail at [facility] and relayed verbal orders for CBC, CMP, CRP, ESR every 2 weeks per Dr. Juleen China. Orders repeated and verified.   Beryle Flock, RN

## 2021-12-29 DIAGNOSIS — M4635 Infection of intervertebral disc (pyogenic), thoracolumbar region: Secondary | ICD-10-CM | POA: Diagnosis not present

## 2021-12-29 DIAGNOSIS — M4325 Fusion of spine, thoracolumbar region: Secondary | ICD-10-CM | POA: Diagnosis not present

## 2021-12-29 DIAGNOSIS — G062 Extradural and subdural abscess, unspecified: Secondary | ICD-10-CM | POA: Diagnosis not present

## 2021-12-30 ENCOUNTER — Ambulatory Visit: Payer: Medicare Other | Admitting: Primary Care

## 2021-12-30 ENCOUNTER — Ambulatory Visit (INDEPENDENT_AMBULATORY_CARE_PROVIDER_SITE_OTHER): Payer: Medicare Other | Admitting: Internal Medicine

## 2021-12-30 ENCOUNTER — Ambulatory Visit (INDEPENDENT_AMBULATORY_CARE_PROVIDER_SITE_OTHER): Payer: Medicare Other

## 2021-12-30 ENCOUNTER — Telehealth: Payer: Self-pay | Admitting: Pharmacist

## 2021-12-30 ENCOUNTER — Telehealth: Payer: Self-pay | Admitting: Primary Care

## 2021-12-30 ENCOUNTER — Encounter: Payer: Self-pay | Admitting: Primary Care

## 2021-12-30 VITALS — BP 138/72 | HR 82 | Temp 98.5°F | Ht 69.0 in | Wt 177.0 lb

## 2021-12-30 DIAGNOSIS — J849 Interstitial pulmonary disease, unspecified: Secondary | ICD-10-CM | POA: Diagnosis not present

## 2021-12-30 DIAGNOSIS — Z7901 Long term (current) use of anticoagulants: Secondary | ICD-10-CM

## 2021-12-30 DIAGNOSIS — I480 Paroxysmal atrial fibrillation: Secondary | ICD-10-CM

## 2021-12-30 DIAGNOSIS — J439 Emphysema, unspecified: Secondary | ICD-10-CM

## 2021-12-30 DIAGNOSIS — J9611 Chronic respiratory failure with hypoxia: Secondary | ICD-10-CM

## 2021-12-30 DIAGNOSIS — I4891 Unspecified atrial fibrillation: Secondary | ICD-10-CM

## 2021-12-30 DIAGNOSIS — R0602 Shortness of breath: Secondary | ICD-10-CM | POA: Diagnosis not present

## 2021-12-30 DIAGNOSIS — J84112 Idiopathic pulmonary fibrosis: Secondary | ICD-10-CM | POA: Diagnosis not present

## 2021-12-30 DIAGNOSIS — J8410 Pulmonary fibrosis, unspecified: Secondary | ICD-10-CM | POA: Insufficient documentation

## 2021-12-30 LAB — PULMONARY FUNCTION TEST
DL/VA % pred: 91 %
DL/VA: 3.58 ml/min/mmHg/L
DLCO cor % pred: 70 %
DLCO cor: 17.38 ml/min/mmHg
DLCO unc % pred: 64 %
DLCO unc: 15.89 ml/min/mmHg
FEF 25-75 Pre: 2.08 L/sec
FEF2575-%Pred-Pre: 100 %
FEV1-%Pred-Pre: 84 %
FEV1-Pre: 2.49 L
FEV1FVC-%Pred-Pre: 105 %
FEV6-%Pred-Pre: 84 %
FEV6-Pre: 3.24 L
FEV6FVC-%Pred-Pre: 106 %
FVC-%Pred-Pre: 79 %
FVC-Pre: 3.27 L
Pre FEV1/FVC ratio: 76 %
Pre FEV6/FVC Ratio: 99 %

## 2021-12-30 LAB — POCT INR: INR: 2.8 (ref 2.0–3.0)

## 2021-12-30 LAB — HEPATIC FUNCTION PANEL
ALT: 14 U/L (ref 0–53)
AST: 22 U/L (ref 0–37)
Albumin: 4.3 g/dL (ref 3.5–5.2)
Alkaline Phosphatase: 65 U/L (ref 39–117)
Bilirubin, Direct: 0.2 mg/dL (ref 0.0–0.3)
Total Bilirubin: 0.7 mg/dL (ref 0.2–1.2)
Total Protein: 6.4 g/dL (ref 6.0–8.3)

## 2021-12-30 LAB — NITRIC OXIDE: Nitric Oxide: 20

## 2021-12-30 NOTE — Patient Instructions (Signed)
Spirometry and DLCO Performed Today.  

## 2021-12-30 NOTE — Telephone Encounter (Signed)
Please start Esbriet BIV. Run auth for brand first due to complications with [facility] patient foundation. If rejection for requiring generic, can run for generic thereafter.  Dose: using '267mg'$  tabs: Take 1 tab three times daily for 7 days, then 2 tabs three times daily for 7 days, then 3 tabs three times daily thereafter.  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

## 2021-12-30 NOTE — Patient Instructions (Signed)
Description   Continue taking 1.5 tablets daily, except 1 tablet Wednesday.  Recheck INR in 1 week.  Ablation 7/13 (1 of 4 Weekly INR checks) Coumadin Clinic# [phone]

## 2021-12-30 NOTE — Assessment & Plan Note (Addendum)
-   No oxygen desaturations on room air during simple walk test today in office - We will check overnight oximetry test on room air to determine whether or not we can discontinue nocturnal oxygen use

## 2021-12-30 NOTE — Telephone Encounter (Signed)
Spoke with patient regarding Esbriet counseling appt. He states he is interested and would like to schedule an appt after his wife's surgery which is tomorrow. Advised of my clinic hours in afternoon and provided him with my direct phone number to reach out to get this scheduled. He will follow-up  Will start Esbriet BIV.  Knox Saliva, PharmD, MPH, BCPS, CPP Clinical Pharmacist (Rheumatology and Pulmonology)

## 2021-12-30 NOTE — Progress Notes (Signed)
Spirometry and DLCO Performed Today.  

## 2021-12-30 NOTE — Assessment & Plan Note (Signed)
-   Continue Stiolto 2 puffs daily and as needed albuterol HFA 2 puffs every 4-6 hours as needed for breakthrough shortness of breath or wheezing

## 2021-12-30 NOTE — Assessment & Plan Note (Addendum)
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  Probable UIP pattern.  Pulmonary function testing today showed improvement in FEV1 but 12% reduction in diffusion capacity.  Symptomatically he is actually some better.  ILD score improved. No daytime oxygen requirements.  Discussed with Dr. Chase Caller, recommending patient be started on antifibrotic to prevent further progression.  Starting Esbriet due to patient's history coronary artery disease and current anticoagulation.  Patient was given patient assistant form to fill out and the patient education handout on Esbriet.  Needs overview with office pharmacist discuss medication further.  We reviewed potential side effects including GI symptoms and weight loss.  We will obtain baseline hepatic function panel today.  Decrease prednisone to 5 mg daily, hopefully discontinue at follow-up.  Follow-up in 4 to 6 weeks with Dr. Chase Caller or [facility] NP.

## 2021-12-30 NOTE — Progress Notes (Signed)
$'@Patient'G$  ID: Luis Brown, male    DOB: [birth year], 76 y.o.   MRN: [id number]  Chief Complaint  Patient presents with   Follow-up    Had PFT today-sob better    Referring provider: Elise Benne  HPI: 76 year old male, former smoker (20-pack-year history).  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   Previous [facility] pulmonary encounters:  12/29/20- Dr. Chase Caller Chief Complaint  Patient presents with   Follow-up    PFT  performed 5/24.  Pt states since last visit, he has been doing okay. States he has lost some weight that he has noticed since last visit.    Follow-up drug-induced pneumonitis daptomycin from early May 2022  HPI Luis Brown 76 y.o. -returns for follow-up.  I personally saw him in the hospital on 11/20/2020.  At that point in time he had been started on Solu-Medrol on 11/18/2020.  When I saw him he was on day 3 of Solu-Medrol and he was needing 3 L nasal cannula oxygen at rest.  At that point in time even with 3 L he was desaturating to 86% just talking.  Put him on prednisone 60 mg/day.  After that he see nurse practitioner x2 at least 1 of which was a telephone visit..  As part of his follow-up on 12/11/2020 he had a CT scan of the chest [3 days after pulmonary function test] and on my personal visualization it showed new onset pneumothorax but significant improvement in infiltrates and inflammation.  He had another follow-up chest x-ray 12/17/2020 in which no pneumothorax reported [similar on 12/15/2020] and pulmonary infiltrates appear improved.  He is now here with his wife  He tells me he is now on 10 mg/day prednisone he just darted that today.  He is 1 week left and after that the prednisone taper ends.  Because of the  pneumothorax is physical therapy has been on hold but 4 days ago he was evaluated and apparently his pulse ox was 100% but this was on oxygen.  He continues to use his oxygen although he is not sure if he is desaturating anymore.  He does feel overall improved compared to 6 weeks ago but still has significant amount of fatigue.  He has significant insomnia which he believes is due to prednisone.  Today's last night was the first night he could sleep when he dropped his prednisone down to 10 mg/day.  He believes his fatigue is multifactorial related to the disease, multiple medications, lack of oxygen, insomnia etc.  02/09/2021 Patient presents today for 1 month follow-up. Since his last visit in June, he has established with cardiology and was dx with afib. He is currently on Xarelto and Digoxin. His wife states that he has had more energy recently. He is scheduled for testosterone injection tomorrow. He gets winded with stairs and heavy exertion. He reports intermittent wheezing. He needs refill of albuterol rescue inhaler. He has weaned himself of oxygen and O2 equipment has been picked up. He has been sleeping better at night. Anxiety has improved, he stopped xanax. Due for follow-up CXR today. 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  02/23/2021 Patient presents today for 2 week follow-up. During his last visit he was given trial Spiriva Respimat d/t dyspnea /wheezing symptoms and moderate obstruction seen on his PFTs. He  has been using sample of Spiriva Respimat 2.89mg as prescribed, he occasionally misses a dose for 1-2 days. He states that his wheezing has stopped but he has not noticed a significant difference in dyspnea symptoms. His ILD scale appears improved from two weeks go. He has having some muscle spasms in his back that started before being placed on LABA.   04/26/2021 -   Chief Complaint  Patient presents with   Follow-up     F/u, trelegy medication concerns    CINDY [id number].o. -returns for follow-up.  Since I last saw him he seen nurse practitioner 2 times.  Symptom scores for dyspnea continue to improve.  He was also walked for oxygen desaturation February 04, 2019 did not desaturate.  He says he continues to get better.  He is off prednisone.  Nurse practitioner noticed obstructive physiology on the pulmonary function test.  Put him on inhaler.  Currently is on Trelegy.  He says it helps him tremendously.  He feels it opens up his lungs.  He declined his flu shot today because he will have it after the COVID-vaccine in October or November 2022.  06/24/2021 Patient presents today for acute OV/cough. Patient reports increased shortness of breath since 06/13/21 with associated fatigue, cough and chest tightness. Cough is productive with clear mucus. Symptoms started after Thanksgiving. His wife had the flu at the same time. His cough initially improved while taking mucinex, he stopped since he was starting to feel better. Denies f/c/s, purulent mucus, N/V/D. CAT score 17.    09/28/2021 -   Chief Complaint  Patient presents with   Follow-up    Pt states he still becomes SOB with exertion and also has an occasional cough. States the cough happens throughout the day and will get some phlegm up that is clear to yellow tinge.   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 And PE with "ILD Falare" Dec 2022/Jan 2023   He also has paroxysmal A. fib, low testosterone, chronic hyponatremia, chronic opioid intake.,  Chronic Lyrica  HPI Luis [id number].o. -returns for follow-up.  Since his last visit he has continued on prednisone 10 mg/day.  He continues to have shortness of breath.  In fact this is unchanged.  He does tell me though starting spring season he has had allergic rhinitis.  Wife states he has seasonal allergies for many years.  He is also having worsening cough.  He  took a antihistamine and is somewhat better.  He continues to schedule Stiolto.  He has upcoming atrial fibrillation ablation end of March 2023.  Also in mid February 2023 had another CT angiogram chest that ruled out pulmonary embolism [done while on anticoagulation].  I personally visualized the CT chest and compared to late December 2023/early January 2023 the findings are better.  There might be some residual scarring and potential emphysema.  We walked him today and his walking desaturation test held up.  He did not desaturate.  There is a huge improvement.  Of note he was supposed have pulmonary function test today but this was not performed.  He does not know why.  Apologized for any missteps.    For his pulmonary embolism: It appears that he might not be taking his testosterone shots anymore.  Regarding seasonal allergies: He has not had work-up for this.  But currently it is active.     12/30/2021- Interim hx  Patient presents today for a 3 month follow-up  with PFTs. Patient needs simple walk test, symptom scale and FENO today.  Patient is maintained on Stiolto. Has not required albuterol. He has chronic shortness of breath and fatigue symptoms which are significantly better since last visit. No cough. He remains on '10mg'$  prednisone daily. No oxygen reqiuirments during daytime. Using 1L oxygen at bedtime.  HRCT on 12/28/2021 showed regression of subpleural and basilar predominant fibrotic interstitial lung disease compared to May 2022.  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 PFTs showed improvement in FEV1 but decreased diffusion capacity. Eosinophil absolute was 100 and March.  Respiratory allergy panel was negative. FENO today was normal. Discussed starting antifibrtoic to slow progression of pulmonary fibrosis.    SYMPTOM SCALE - ILD 08/12/2021 09/28/2021  12/30/2021   Current weight   177lb  O2 use 2L [place] but at rest he was 95%  RA, uses  Shortness of Breath 0 -> 5 scale with 5 being worst  (score 6 If unable to do)    At rest 1 1 0  Simple tasks - showers, clothes change, eating, shaving 3 2 0  Household (dishes, doing bed, laundry) '3 2 1  '$ Shopping xno 3 1  Walking level at own pace 2 1.5 0  Walking up Stairs 3 2.5 2  Total (30-36) Dyspnea Score '12 12 4  '$ How bad is your cough? Not often 3.5 Not often  How bad is your fatigue A lot 2 2  How bad is nausea 0 0 0  How bad is vomiting?  0 0 0  How bad is diarrhea? 0 0 0  How bad is anxiety? some 1 0  How bad is depression A little 0 0.5  Any chronic pain - if so where and how bad x  Back    Simple office walk 185 feet x  3 laps goal with forehead probe 08/12/2021  09/28/2021  12/30/2021   O2 used ra ra RA  Number laps completed 1  3  Comments about pace steady  Steady  Resting Pulse Ox/HR 95% and 89/min 99% and HR 90 97%; HR 97   Final Pulse Ox/HR 86% and 122/min 94% and HR 114 98%; HR 120  Desaturated </= 88% no no No  Desaturated <= 3% points yes Yes, 5 ponts No  Got Tachycardic >/= 90/min yes yes Yes  Symptoms at end of test dyspnea Mild does None  Miscellaneous comments x improve x          Allergies  Allergen Reactions   Daptomycin     Drug induced pneumonia    Diltiazem Other (See Comments)    Passed out    Immunization History  Administered Date(s) Administered   Fluad Quad(high Dose 65+) 04/05/2019, 06/29/2020, 05/18/2021   Influenza Split 04/23/2012   Influenza Whole 02/16/2011   Influenza, High Dose Seasonal PF 05/06/2013, 03/25/2016, 05/05/2017, 06/07/2018   PFIZER(Purple Top)SARS-COV-2 Vaccination 08/23/2019, 09/13/2019, 06/07/2020   Pfizer Covid-19 Vaccine Bivalent Booster 44yr & up 04/29/2021   Pneumococcal Conjugate-13 03/25/2016   Pneumococcal Polysaccharide-23 01/20/2009, 10/19/2011   Td 07/09/2003, 07/18/2008   Tdap 07/20/2011   Zoster, Live 04/27/2016    Past Medical History:  Diagnosis Date   Acute pharyngitis 10/21/2013   Allergy    grass and pollen   Anxiety and  depression 10/25/2011   BPH (benign prostatic hyperplasia) 04/23/2012   Chicken pox as a child   DDD (degenerative disc disease)    cervical responds to steroid injections and low back required surgery   DDD (degenerative disc disease),  lumbosacral    Diabetes mellitus    pre   Dyspnea    ED (erectile dysfunction) 04/23/2012   Elevated BP    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   Esophageal reflux 02/10/2015   Fatigue    HTN (hypertension)    Hyperglycemia    preDM    Hyperlipidemia    Insomnia    Low back pain radiating to both legs 01/16/2017   Measles as a child   Overweight(278.02)    Peripheral neuropathy 08/07/2019   Personal history of colonic polyps 10/27/2012   Follows with [facility]   Pneumonia    " walking"   Preventative health care    Testosterone deficiency 05/23/2012   Wears glasses     Tobacco History: Social History   Tobacco Use  Smoking Status Former   Packs/day: 1.00   Years: 20.00   Total pack years: 20.00   Types: Cigarettes   Quit date: 07/18/1989   Years since quitting: 32.4  Smokeless Tobacco Never   Counseling given: Not Answered   Outpatient Medications Prior to Visit  Medication Sig Dispense Refill   albuterol (PROVENTIL HFA) 108 (90 Base) MCG/ACT inhaler TAKE 2 PUFFS BY MOUTH EVERY 6 HOURS AS NEEDED FOR WHEEZE OR SHORTNESS OF BREATH 8 g 5   atorvastatin (LIPITOR) 10 MG tablet TAKE 1 TABLET BY MOUTH EVERY DAY 90 tablet 1   DALVANCE 500 MG SOLR Inject 500 mg into the vein every 14 (fourteen) days. Pt states this is a every 2 week antibiotic. Could not state how long therapy should be.     dextromethorphan-guaiFENesin (MUCINEX DM) 30-600 MG 12hr tablet Take 1 tablet by mouth 2 (two) times daily.     famciclovir (FAMVIR) 500 MG tablet Take 1 tablet (500 mg total) by mouth 3 (three) times daily. 21 tablet 0   glucose blood (CONTOUR NEXT TEST) test strip Check blood sugar once daily 100 each 12   insulin glargine (LANTUS) 100 UNIT/ML Solostar Pen  Inject 10 Units into the skin daily. 15 mL 0   Insulin Pen Needle (B-D UF III MINI PEN NEEDLES) 31G X 5 MM MISC USE WITH LANTUS 100 each 0   metFORMIN (GLUCOPHAGE) 1000 MG tablet TAKE 1 TABLET (1,000 MG TOTAL) BY MOUTH TWICE A DAY WITH FOOD 180 tablet 0   oxyCODONE-acetaminophen (PERCOCET/ROXICET) 5-325 MG tablet Take 1 tablet by mouth every 4 (four) hours as needed for moderate pain.     pantoprazole (PROTONIX) 40 MG tablet TAKE 1 TABLET BY MOUTH TWICE A DAY BEFORE A MEAL 180 tablet 1   predniSONE (DELTASONE) 10 MG tablet Take 1 tablet (10 mg total) by mouth daily with breakfast. 30 tablet 3   pregabalin (LYRICA) 75 MG capsule TAKE 1 CAPSULE BY MOUTH TWICE A DAY (Patient taking differently: Take 75 mg by mouth 2 (two) times daily.) 60 capsule 3   sitaGLIPtin (JANUVIA) 50 MG tablet Take 1 tablet (50 mg total) by mouth daily. 30 tablet 3   tamsulosin (FLOMAX) 0.4 MG CAPS capsule Take 2 capsules (0.8 mg total) by mouth daily. (Patient taking differently: Take 0.8 mg by mouth at bedtime.) 180 capsule 3   Tiotropium Bromide-Olodaterol (STIOLTO RESPIMAT) 2.5-2.5 MCG/ACT AERS Inhale 2 puffs into the lungs daily. 4 g 0   tiZANidine (ZANAFLEX) 4 MG tablet TAKE 1.5 TABLETS (6 MG TOTAL) BY MOUTH EVERY 6 (SIX) HOURS AS NEEDED FOR MUSCLE SPASMS. (Patient taking differently: Take 4 mg by mouth every 4 (four) hours.) 540 tablet 1   venlafaxine XR (  EFFEXOR-XR) 150 MG 24 hr capsule Take 1 capsule (150 mg total) by mouth daily with breakfast. 90 capsule 0   warfarin (COUMADIN) 5 MG tablet TAKE 1 TO 2 TABLETS DAILY OR AS PRESCRIBED BY COUMADIN CLINIC 130 tablet 0   zolpidem (AMBIEN) 5 MG tablet Take 1 tablet (5 mg total) by mouth at bedtime as needed. for sleep 30 tablet 0   STIOLTO RESPIMAT 2.5-2.5 MCG/ACT AERS Inhale 2 puffs into the lungs daily. 1 each 5   Facility-Administered Medications Prior to Visit  Medication Dose Route Frequency Provider Last Rate Last Admin   testosterone cypionate (DEPOTESTOSTERONE  CYPIONATE) injection 200 mg  200 mg Intramuscular Q14 Days Saguier, Percell Miller, PA-C   200 mg at 07/06/21 1093      Review of Systems  Review of Systems  Constitutional:  Positive for fatigue.  HENT: Negative.    Respiratory:  Positive for shortness of breath. Negative for cough, chest tightness and wheezing.   Cardiovascular: Negative.      Physical Exam  BP 138/72 (BP Location: Right Arm, Cuff Size: Normal)   Pulse 82   Temp 98.5 F (36.9 C) (Temporal)   Ht '5\' 9"'$  (1.753 m)   Wt 177 lb (80.3 kg)   SpO2 96%   BMI 26.14 kg/m  Physical Exam Constitutional:      Appearance: Normal appearance.  HENT:     Head: Normocephalic and atraumatic.     Mouth/Throat:     Mouth: Mucous membranes are moist.     Pharynx: Oropharynx is clear.  Cardiovascular:     Rate and Rhythm: Normal rate and regular rhythm.  Pulmonary:     Comments: Distant crackles bilateral lung bases  Musculoskeletal:        General: Normal range of motion.  Skin:    General: Skin is warm and dry.  Neurological:     General: No focal deficit present.     Mental Status: He is alert and oriented to person, place, and time. Mental status is at baseline.  Psychiatric:        Mood and Affect: Mood normal.        Behavior: Behavior normal.        Thought Content: Thought content normal.        Judgment: Judgment normal.      Lab Results:  CBC    Component Value Date/Time   WBC 7.3 10/05/2021 1429   WBC 9.2 09/28/2021 1112   RBC 4.13 (L) 10/05/2021 1429   HGB 11.9 (L) 10/05/2021 1429   HGB 12.8 (L) 11/18/2020 1608   HCT 36.5 (L) 10/05/2021 1429   PLT 322 10/05/2021 1429   MCV 88.4 10/05/2021 1429   MCH 28.8 10/05/2021 1429   MCHC 32.6 10/05/2021 1429   RDW 15.1 10/05/2021 1429   LYMPHSABS 0.8 10/05/2021 1429   MONOABS 0.9 10/05/2021 1429   EOSABS 0.2 10/05/2021 1429   BASOSABS 0.1 10/05/2021 1429    BMET    Component Value Date/Time   NA 131 (L) 10/05/2021 1429   NA 138 02/01/2021 0835   K  4.3 10/05/2021 1429   CL 92 (L) 10/05/2021 1429   CO2 30 10/05/2021 1429   GLUCOSE 241 (H) 10/05/2021 1429   BUN 10 10/05/2021 1429   BUN 12 02/01/2021 0835   CREATININE 0.85 10/05/2021 1429   CREATININE 0.66 (L) 03/15/2021 1519   CALCIUM 9.2 10/05/2021 1429   GFRNONAA >60 10/05/2021 1429   GFRNONAA 84 12/04/2020 1523   GFRAA 98 12/04/2020  1523    BNP    Component Value Date/Time   BNP 82.0 07/17/2021 0508    ProBNP No results found for: "PROBNP"  Imaging: CT Chest High Resolution  Result Date: 12/28/2021 CLINICAL DATA:  Interstitial lung disease EXAM: CT CHEST WITHOUT CONTRAST TECHNIQUE: Multidetector CT imaging of the chest was performed following the standard protocol without intravenous contrast. High resolution imaging of the lungs, as well as inspiratory and expiratory imaging, was performed. RADIATION DOSE REDUCTION: This exam was performed according to the departmental dose-optimization program which includes automated exposure control, adjustment of the mA and/or kV according to patient size and/or use of iterative reconstruction technique. COMPARISON:  Chest CT dated September 01, 2021; chest CT dated August 07, 2021 FINDINGS: Cardiovascular: Normal heart size. No pericardial effusion. Moderate three-vessel coronary artery calcifications. Mild atherosclerotic disease of the thoracic aorta. Mediastinum/Nodes: Esophagus and thyroid are unremarkable. No pathologically enlarged lymph nodes seen in the chest. Lungs/Pleura: Central airways are patent. No significant air trapping. Mild centrilobular emphysema. Mild ground-glass opacities of the left greater than right lung apices, similar to most recent prior exam, but improved when compared more remote priors. Lower lung predominant subpleural reticular opacities with associated traction bronchiectasis and possible early honeycomb change of the left lower lobe, findings have progressed when compared with Dec 11, 2020 prior. Small  bilateral solid pulmonary nodules are unchanged when compared with Dec 11, 2020 prior and favored to be benign. Reference nodule of the right middle lobe measuring 4 mm on series 4, image 157, unchanged. Reference nodule of the right lower lobe measuring 4 mm on image 161, unchanged. Upper Abdomen: No acute abnormality. Musculoskeletal: Orthopedic hardware of the thoracolumbar spine. No acute osseous abnormality. IMPRESSION: 1. 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 Findings are categorized as probable UIP per consensus guidelines: Diagnosis of Idiopathic Pulmonary Fibrosis: An Official ATS/ERS/JRS/ALAT Clinical Practice Guideline. [place], Iss 5, [phone], Mar 18 2017. 2. Mild ground-glass opacities of the left greater than right lung apices, similar to most recent prior exam, but improved when compared more remote priors, likely sequela of prior infectious or inflammatory process. 3. Aortic Atherosclerosis (ICD10-I70.0) and Emphysema (ICD10-J43.9). Electronically Signed   By: Yetta Glassman M.D.   On: 12/28/2021 09:36     Assessment & Plan:   IPF (idiopathic pulmonary fibrosis) (HCC) - Diagnosis IPF based on age and progression of ILD on recent CT imaging compared to May 2022.  Probable UIP pattern.  Pulmonary function testing today showed improvement in FEV1 but 12% reduction in diffusion capacity.  Symptomatically he is actually some better.  ILD score improved. No daytime oxygen requirements.  Discussed with Dr. Chase Caller, recommending patient be started on antifibrotic to prevent further progression.  Starting Esbriet due to patient's history coronary artery disease and current anticoagulation.  Patient was given patient assistant form to fill out and the patient education handout on Esbriet.  Needs overview with office pharmacist discuss medication further.  We reviewed potential  side effects including GI symptoms and weight loss.  We will obtain baseline hepatic function panel today.  Decrease prednisone to 5 mg daily, hopefully discontinue at follow-up.  Follow-up in 4 to 6 weeks with Dr. Chase Caller or [facility] NP.  Chronic respiratory failure with hypoxia (HCC) - No oxygen desaturations on room air during simple walk test today in office - We will check overnight oximetry test on room air to determine  whether or not we can discontinue nocturnal oxygen use   COPD (chronic obstructive pulmonary disease) (HCC) - Continue Stiolto 2 puffs daily and as needed albuterol HFA 2 puffs every 4-6 hours as needed for breakthrough shortness of breath or wheezing   Martyn Ehrich, NP 12/30/2021

## 2021-12-30 NOTE — Telephone Encounter (Signed)
Need appointment with pharmacy to discuss starting antifibrotic's. Dx IPF d/t age and progression ILD on most recent CT imaging. Recommending patient be started on Esbriet due to coronary artery disease and blood thinner use.

## 2021-12-30 NOTE — Patient Instructions (Addendum)
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  Recommend starting you on antifibrotic medication called Esbriet to slow down progression of fibrosis.  We will have you meet with the pharmacist to discuss medication in greater detail and ensure insurance will cover medication.   Allergy testing was normal FEO was normal You did not need supplemental oxygen walk test today, we will check overnight oximetry test on room air to determine if we can discontinue oxygen at night  Recommendations - Continue Stiolto 2 puffs daily in the morning - Decrease prednisone to half tablet daily (total '5mg'$  daily) until follow-up  - Start Esbriet 1 tablet three times daily x1 week; then increase to 2 tablets three times daily x1 week; then increase to 3 tablets three times daily - Notify office if you develop significant nausea, vomiting, diarrhea or weight loss  Orders Simple walk test (resting and final pulse ox/heart rate = 3 laps with forehead probe) FENO  Labs today to monitor baseline liver function Overnight oximetry test on room air  Follow-up: 6 weeks with Dr. Chase Caller 30-minute visit or [facility] NP /need repeat labs 4 to 6 weeks after starting antifibrotic medication to monitor liver function    Pulmonary Fibrosis  Pulmonary fibrosis is a type of lung disease that causes scarring. Over time, the scar tissue builds up in the air sacs of your lungs (alveoli). This makes it hard for you to breathe because less oxygen gets into your bloodstream. Scarring from pulmonary fibrosis is permanent and may lead to other serious health problems. What are the causes? There are many different causes of pulmonary fibrosis. In some cases, the cause is not known. This is called idiopathic pulmonary fibrosis. Other causes include: Exposure to chemicals and substances found in agricultural, farm, Architect, or factory  work. These include mold, asbestos, silica, metal dusts, and toxic fumes. Sarcoidosis. In this disease, areas of inflammatory cells (granulomas) form and most often affect the lungs. Autoimmune diseases. These include diseases such as rheumatoid arthritis, systemic sclerosis, or connective tissue disease. Taking certain medicines. These include drugs used in radiation therapy or used to treat seizures, heart problems, and some infections. What increases the risk? You are more likely to develop this condition if: You have a family history of the disease. You are an older person. The condition is more common in older adults. You have a history of smoking. You have a job that exposes you to certain chemicals. You have gastroesophageal reflux disease (GERD). What are the signs or symptoms? Symptoms of this condition include: Difficulty breathing that gets worse with activity. Shortness of breath (dyspnea). Dry, hacking cough. Rapid, shallow breathing during exercise or while at rest. Other symptoms may include: Loss of appetite or weight loss Tiredness (fatigue) or weakness. Bluish skin and lips. Rounded and enlarged fingertips (clubbing). How is this diagnosed? This condition may be diagnosed based on: Your symptoms and medical history. A physical exam. You may also have tests, including: A test that involves looking inside your lungs with an instrument (bronchoscopy). Imaging studies of your lungs and heart. Tests to measure how well you are breathing (pulmonary function tests). Blood tests. Tests to see how well your lungs work while you are walking (pulmonary stress test). A procedure to remove a lung tissue sample to look at it under a microscope (biopsy). How is this treated? There is no cure for pulmonary fibrosis. Treatment focuses on managing  symptoms and preventing scarring from getting worse. This may include: Medicines, such as: Steroids to prevent permanent lung  changes. Medicines to suppress your body's defense system (immune system). Medicines to help with lung function by reducing inflammation or scarring. Ongoing monitoring with X-rays and lab work. Oxygen therapy. Pulmonary rehabilitation. Surgery. In some cases, a lung transplant is possible. Follow these instructions at home:  Medicines Take over-the-counter and prescription medicines only as told by your health care provider. Keep your vaccinations up to date as recommended by your health care provider. Activity Get regular exercise, but do not pick activities that are too strenuous for you. Ask your health care provider what activities are safe for you. If you have physical limitations, you may get exercise by walking, using a stationary bike, or doing chair exercises. Ask your health care provider about using oxygen while exercising. Do breathing exercises as told by your health care provider. Plan rest periods when you get tired. General instructions Do not use any products that contain nicotine or tobacco. These products include cigarettes, chewing tobacco, and vaping devices, such as e-cigarettes. If you need help quitting, ask your health care provider. If you are exposed to chemicals and substances at work, make sure that you wear a mask or respirator at all times. Learn to manage stress. If you need help to do this, ask your health care provider. Join a pulmonary rehabilitation program or a support group for people with pulmonary fibrosis. Eat small meals often so you do not get too full. Overeating can make breathing trouble worse. Maintain a healthy weight. Lose weight if you need to. Keep all follow-up visits. This is important. Where to find more information American Lung Association: www.lung.org National Heart, Lung, and Blood Institute: https://wilson-eaton.com/ Pulmonary Fibrosis Foundation: pulmonaryfibrosis.org Contact a health care provider if: You have symptoms that do not  get better with medicines. You are not able to be as active as usual. You have trouble taking a deep breath. You have a fever or chills. You have blue lips or skin. You have a lot of headaches. You cough up mucus that is dark in color. You have feelings of depression or sadness. You are unable to sleep because it is hard to breathe. Get help right away if: Your symptoms suddenly worsen. You have chest pain. You cough up blood. You get very confused or sleepy. These symptoms may be an emergency. Get help right away. Call 911. Do not wait to see if the symptoms will go away. Do not drive yourself to the hospital. Summary Pulmonary fibrosis is a type of lung disease that causes scar tissue to build up in the air sacs of your lungs (alveoli) over time. This makes it hard for you to breathe because less oxygen gets into your bloodstream. Scarring from pulmonary fibrosis is permanent and may lead to other serious health problems. You are more likely to develop this condition if you have a family history of the condition or a job that exposes you to certain chemicals. There is no cure for pulmonary fibrosis. Treatment focuses on managing symptoms and preventing scarring from getting worse. This information is not intended to replace advice given to you by your health care provider. Make sure you discuss any questions you have with your health care provider. Document Revised: 02/23/2021 Document Reviewed: 02/23/2021 Elsevier Patient Education  [place].

## 2021-12-30 NOTE — Telephone Encounter (Signed)
I gave him patient assistance form to fill out and bring back and also educational hand out on Esbriet. Getting baseline liver function today.

## 2021-12-31 ENCOUNTER — Ambulatory Visit: Payer: Medicare Other | Admitting: Specialist

## 2022-01-03 ENCOUNTER — Other Ambulatory Visit: Payer: Self-pay | Admitting: Internal Medicine

## 2022-01-04 ENCOUNTER — Ambulatory Visit (INDEPENDENT_AMBULATORY_CARE_PROVIDER_SITE_OTHER): Payer: Medicare Other | Admitting: *Deleted

## 2022-01-04 ENCOUNTER — Encounter: Payer: Self-pay | Admitting: Medical

## 2022-01-04 DIAGNOSIS — I480 Paroxysmal atrial fibrillation: Secondary | ICD-10-CM | POA: Diagnosis not present

## 2022-01-04 DIAGNOSIS — I4891 Unspecified atrial fibrillation: Secondary | ICD-10-CM | POA: Diagnosis not present

## 2022-01-04 DIAGNOSIS — Z7901 Long term (current) use of anticoagulants: Secondary | ICD-10-CM | POA: Diagnosis not present

## 2022-01-04 LAB — POCT INR: INR: 2.8 (ref 2.0–3.0)

## 2022-01-04 NOTE — Telephone Encounter (Signed)
Submitted a Prior Authorization request to [facility] for ESBRIET via CoverMyMeds. Will update once we receive a response.   Key: [id number]

## 2022-01-04 NOTE — Patient Instructions (Signed)
Description   Continue taking 1.5 tablets daily, except 1 tablet Wednesday.  Recheck INR in 1 week.  Ablation 7/13 (2 of 4 Weekly INR checks). Coumadin Clinic# [phone]

## 2022-01-05 ENCOUNTER — Other Ambulatory Visit (HOSPITAL_COMMUNITY): Payer: Self-pay

## 2022-01-05 MED ORDER — ZOLPIDEM TARTRATE 5 MG PO TABS: 5.0000 mg | ORAL_TABLET | Freq: Every evening | ORAL | 0 refills | Status: DC | PRN

## 2022-01-05 NOTE — Telephone Encounter (Addendum)
Received notification from [facility] regarding a prior authorization for ESBRIET. Authorization has been APPROVED from 01/04/2022 to 07/17/2022. Approval letter sent to scan center.  Per test claim, copay for 30 days supply of name-brand Esbriet is $558.05. It is worth noting that generic Pirfenidone is covered under formulary and copay for 30 days supply is $65.09 should pt not be approved for PAP.   Patient can fill through [place]: [phone]   Authorization # [id number]   Will finalize paperwork for PAP submission.

## 2022-01-05 NOTE — Telephone Encounter (Signed)
Ambien sent to pharmacy.Please make sure rx went thru. Let me know if did not.

## 2022-01-06 ENCOUNTER — Encounter: Payer: Self-pay | Admitting: Specialist

## 2022-01-06 ENCOUNTER — Telehealth: Payer: Self-pay

## 2022-01-06 ENCOUNTER — Other Ambulatory Visit (HOSPITAL_COMMUNITY): Payer: Self-pay

## 2022-01-06 ENCOUNTER — Other Ambulatory Visit: Payer: Self-pay | Admitting: Radiology

## 2022-01-06 DIAGNOSIS — Z5181 Encounter for therapeutic drug level monitoring: Secondary | ICD-10-CM

## 2022-01-06 DIAGNOSIS — J84112 Idiopathic pulmonary fibrosis: Secondary | ICD-10-CM

## 2022-01-06 MED ORDER — PIRFENIDONE 267 MG PO TABS
ORAL_TABLET | ORAL | 0 refills | Status: DC
  Filled 2022-01-06: qty 207, fill #0
  Filled 2022-01-10: qty 207, 30d supply, fill #0

## 2022-01-06 MED ORDER — TIZANIDINE HCL 4 MG PO TABS: 4.0000 mg | ORAL_TABLET | Freq: Four times a day (QID) | ORAL | 3 refills | Status: DC | PRN

## 2022-01-06 MED ORDER — PIRFENIDONE 267 MG PO TABS
801.0000 mg | ORAL_TABLET | Freq: Three times a day (TID) | ORAL | 1 refills | Status: DC
  Filled 2022-01-06: qty 270, 30d supply, fill #0

## 2022-01-06 NOTE — Telephone Encounter (Signed)
Contacted patient to update him on Esbriet PA approval as well as pirfenidone generic copay of $65/month. Patient would like to move forward using generic pirfenidone so he can go ahead and get started. He would like to move forward with patient assistance after starting generic.   Patient's wife recently had hip surgery and he is unable to be away from her for long. He was previously interested in coming in person for counseling, but is now okay with doing counseling via phone. Rx for pirfenidone sent to [facility] today, however, patient would like to still pursue Esbriet patient assistance. Patient instructed to drop off paper work and attention to pharmacy team.    Joseph Art, Pharm.D. PGY-1 Ambulatory Care Pharmacy Resident 01/06/2022 3:17 PM

## 2022-01-06 NOTE — Telephone Encounter (Signed)
Subjective:  Patient presents today to [facility] to see pharmacy team for Esbriet new start.   Patient was last seen and referred by [name], NP on 12/30/21. Pertinent past medical history includes asthma, COPD, IPF, HTN, afib, DM, and history of DVT/PE.   History of elevated LFTs: No History of diarrhea, nausea, vomiting: No  Objective: Allergies  Allergen Reactions   Daptomycin     Drug induced pneumonia    Diltiazem Other (See Comments)    Passed out    Outpatient Encounter Medications as of 01/06/2022  Medication Sig Note   albuterol (PROVENTIL HFA) 108 (90 Base) MCG/ACT inhaler TAKE 2 PUFFS BY MOUTH EVERY 6 HOURS AS NEEDED FOR WHEEZE OR SHORTNESS OF BREATH    atorvastatin (LIPITOR) 10 MG tablet TAKE 1 TABLET BY MOUTH EVERY DAY    DALVANCE 500 MG SOLR Inject 500 mg into the vein every 14 (fourteen) days. Pt states this is a every 2 week antibiotic. Could not state how long therapy should be. 08/07/2021: Every other Thursday    dextromethorphan-guaiFENesin (MUCINEX DM) 30-600 MG 12hr tablet Take 1 tablet by mouth 2 (two) times daily.    famciclovir (FAMVIR) 500 MG tablet Take 1 tablet (500 mg total) by mouth 3 (three) times daily.    glucose blood (CONTOUR NEXT TEST) test strip Check blood sugar once daily    insulin glargine (LANTUS) 100 UNIT/ML Solostar Pen Inject 10 Units into the skin daily.    Insulin Pen Needle (B-D UF III MINI PEN NEEDLES) 31G X 5 MM MISC USE WITH LANTUS    metFORMIN (GLUCOPHAGE) 1000 MG tablet TAKE 1 TABLET (1,000 MG TOTAL) BY MOUTH TWICE A DAY WITH FOOD    oxyCODONE-acetaminophen (PERCOCET/ROXICET) 5-325 MG tablet Take 1 tablet by mouth every 4 (four) hours as needed for moderate pain.    pantoprazole (PROTONIX) 40 MG tablet TAKE 1 TABLET BY MOUTH TWICE A DAY BEFORE A MEAL    predniSONE (DELTASONE) 10 MG tablet TAKE 1 TABLET (10 MG TOTAL) BY MOUTH DAILY WITH BREAKFAST.    pregabalin (LYRICA) 75 MG capsule TAKE 1 CAPSULE BY MOUTH TWICE A DAY  (Patient taking differently: Take 75 mg by mouth 2 (two) times daily.)    sitaGLIPtin (JANUVIA) 50 MG tablet Take 1 tablet (50 mg total) by mouth daily.    STIOLTO RESPIMAT 2.5-2.5 MCG/ACT AERS Inhale 2 puffs into the lungs daily.    tamsulosin (FLOMAX) 0.4 MG CAPS capsule Take 2 capsules (0.8 mg total) by mouth daily. (Patient taking differently: Take 0.8 mg by mouth at bedtime.)    Tiotropium Bromide-Olodaterol (STIOLTO RESPIMAT) 2.5-2.5 MCG/ACT AERS Inhale 2 puffs into the lungs daily.    tiZANidine (ZANAFLEX) 4 MG tablet TAKE 1.5 TABLETS (6 MG TOTAL) BY MOUTH EVERY 6 (SIX) HOURS AS NEEDED FOR MUSCLE SPASMS. (Patient taking differently: Take 4 mg by mouth every 4 (four) hours.)    venlafaxine XR (EFFEXOR-XR) 150 MG 24 hr capsule Take 1 capsule (150 mg total) by mouth daily with breakfast.    warfarin (COUMADIN) 5 MG tablet TAKE 1 TO 2 TABLETS DAILY OR AS PRESCRIBED BY COUMADIN CLINIC    zolpidem (AMBIEN) 5 MG tablet Take 1 tablet (5 mg total) by mouth at bedtime as needed. for sleep    Facility-Administered Encounter Medications as of 01/06/2022  Medication   testosterone cypionate (DEPOTESTOSTERONE CYPIONATE) injection 200 mg     Immunization History  Administered Date(s) Administered   Fluad Quad(high Dose 65+) 04/05/2019, 06/29/2020, 05/18/2021   Influenza  Split 04/23/2012   Influenza Whole 02/16/2011   Influenza, High Dose Seasonal PF 05/06/2013, 03/25/2016, 05/05/2017, 06/07/2018   PFIZER(Purple Top)SARS-COV-2 Vaccination 08/23/2019, 09/13/2019, 06/07/2020   Pfizer Covid-19 Vaccine Bivalent Booster 76yr & up 04/29/2021   Pneumococcal Conjugate-13 03/25/2016   Pneumococcal Polysaccharide-23 01/20/2009, 10/19/2011   Td 07/09/2003, 07/18/2008   Tdap 07/20/2011   Zoster, Live 04/27/2016      PFT's TLC  Date Value Ref Range Status  12/08/2020 5.04 L Final      CMP     Component Value Date/Time   NA 131 (L) 10/05/2021 1429   NA 138 02/01/2021 0835   K 4.3 10/05/2021 1429    CL 92 (L) 10/05/2021 1429   CO2 30 10/05/2021 1429   GLUCOSE 241 (H) 10/05/2021 1429   BUN 10 10/05/2021 1429   BUN 12 02/01/2021 0835   CREATININE 0.85 10/05/2021 1429   CREATININE 0.66 (L) 03/15/2021 1519   CALCIUM 9.2 10/05/2021 1429   PROT 6.4 12/30/2021 1258   PROT 6.2 06/24/2019 0837   ALBUMIN 4.3 12/30/2021 1258   AST 22 12/30/2021 1258   AST 19 10/05/2021 1429   ALT 14 12/30/2021 1258   ALT 13 10/05/2021 1429   ALKPHOS 65 12/30/2021 1258   BILITOT 0.7 12/30/2021 1258   BILITOT 0.8 10/05/2021 1429   GFRNONAA >60 10/05/2021 1429   GFRNONAA 84 12/04/2020 1523   GFRAA 98 12/04/2020 1523      CBC    Component Value Date/Time   WBC 7.3 10/05/2021 1429   WBC 9.2 09/28/2021 1112   RBC 4.13 (L) 10/05/2021 1429   HGB 11.9 (L) 10/05/2021 1429   HGB 12.8 (L) 11/18/2020 1608   HCT 36.5 (L) 10/05/2021 1429   PLT 322 10/05/2021 1429   MCV 88.4 10/05/2021 1429   MCH 28.8 10/05/2021 1429   MCHC 32.6 10/05/2021 1429   RDW 15.1 10/05/2021 1429   LYMPHSABS 0.8 10/05/2021 1429   MONOABS 0.9 10/05/2021 1429   EOSABS 0.2 10/05/2021 1429   BASOSABS 0.1 10/05/2021 1429    LFT's    Latest Ref Rng & Units 12/30/2021   12:58 PM 10/05/2021    2:29 PM 08/16/2021    2:51 PM  Hepatic Function  Total Protein 6.0 - 8.3 g/dL 6.4  6.6  6.3   Albumin 3.5 - 5.2 g/dL 4.3  4.0  4.3   AST 0 - 37 U/L _0 ALT 0 - 53 U/L _1 Alk Phosphatase 39 - 117 U/L 65  63  55   Total Bilirubin 0.2 - 1.2 mg/dL 0.7  0.8  0.5   Bilirubin, Direct 0.0 - 0.3 mg/dL 0.2       HRCT (12/27/21) 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 Findings are categorized as probable UIP per consensus guidelines  Assessment and Plan  Esbriet Medication Management Thoroughly counseled patient on the efficacy, mechanism of action, dosing, administration, adverse effects, and monitoring parameters of Esbriet.   Patient verbalized understanding. Patient education handout provided.   Goals of Therapy: Will not stop or reverse the progression of ILD. It will slow the progression of ILD.   Dosing: Starting dose will be Esbriet 267 mg 1 tablet three times daily for 7 days, then 2 tablets three times daily for 7 days, then 3 tablets three times daily.  Maintenance dose will be 801 mg 1 tablet three times daily if  tolerated.  Stressed the importance of taking with meals and space at least 5-6 hours apart to minimize stomach upset.   Adverse Effects: Nausea, vomiting, diarrhea, weight loss Abdominal pain GERD Sun sensitivity/rash - patient advised to wear sunscreen when exposed to sunlight Dizziness Fatigue  Monitoring: Monitor for diarrhea, nausea and vomiting, GI perforation, hepatotoxicity  Monitor LFTs - baseline, monthly for first 6 months, then every 3 months  CBC w differential at baseline and every 3 months routinely  Access: Approval of Esbriet through: insurance Rx sent to: [place]: [phone]   Medication Reconciliation A drug regimen assessment was performed, including review of allergies, interactions, disease-state management, dosing and immunization history. Medications were reviewed with the patient, including name, instructions, indication, goals of therapy, potential side effects, importance of adherence, and safe use.  Immunizations Patient is indicated for the influenzae, pneumonia, and shingles vaccinations. Patient has received 4 COVID19 vaccines.  This appointment required 30 minutes of patient care (this includes precharting, chart review, review of results, face-to-face care, etc.).  Thank you for involving pharmacy to assist in providing this patient's care.   Joseph Art, Pharm.D. PGY-1 Ambulatory Care Pharmacy Resident 01/06/2022 3:46 PM

## 2022-01-10 ENCOUNTER — Other Ambulatory Visit: Payer: Self-pay | Admitting: Cardiology

## 2022-01-10 ENCOUNTER — Ambulatory Visit: Payer: Medicare Other | Admitting: Cardiology

## 2022-01-10 ENCOUNTER — Other Ambulatory Visit (HOSPITAL_COMMUNITY): Payer: Self-pay

## 2022-01-11 ENCOUNTER — Encounter: Payer: Self-pay | Admitting: Cardiology

## 2022-01-11 ENCOUNTER — Ambulatory Visit (INDEPENDENT_AMBULATORY_CARE_PROVIDER_SITE_OTHER): Payer: Medicare Other | Admitting: *Deleted

## 2022-01-11 DIAGNOSIS — I480 Paroxysmal atrial fibrillation: Secondary | ICD-10-CM

## 2022-01-11 DIAGNOSIS — I4891 Unspecified atrial fibrillation: Secondary | ICD-10-CM

## 2022-01-11 DIAGNOSIS — Z7901 Long term (current) use of anticoagulants: Secondary | ICD-10-CM | POA: Diagnosis not present

## 2022-01-11 LAB — POCT INR: INR: 2.5 (ref 2.0–3.0)

## 2022-01-13 ENCOUNTER — Encounter: Payer: Self-pay | Admitting: Infectious Disease

## 2022-01-13 DIAGNOSIS — M4325 Fusion of spine, thoracolumbar region: Secondary | ICD-10-CM | POA: Diagnosis not present

## 2022-01-13 DIAGNOSIS — M4635 Infection of intervertebral disc (pyogenic), thoracolumbar region: Secondary | ICD-10-CM | POA: Diagnosis not present

## 2022-01-13 DIAGNOSIS — G062 Extradural and subdural abscess, unspecified: Secondary | ICD-10-CM | POA: Diagnosis not present

## 2022-01-14 ENCOUNTER — Encounter: Payer: Self-pay | Admitting: Internal Medicine

## 2022-01-14 NOTE — Telephone Encounter (Signed)
Reviewed w/ pharmD. Pt aware/advised that d/t possible pretty intense GI SE that could possibly occur after starting medication, that may be best to wait until after ablation to start so that if SE do occur it does not prevent/interfere with ablation procedure. Pt agreeable and really appreciates the office's input on the matter.

## 2022-01-16 ENCOUNTER — Encounter: Payer: Self-pay | Admitting: Medical

## 2022-01-17 NOTE — Telephone Encounter (Signed)
Dr. Alveria Apley please provide advise if any to following My Chart message:    Having heart ablation on 13th of July. My Dr said I should not start med till a few days after procedure due to possible side effects that could cares problems. Thanks

## 2022-01-18 ENCOUNTER — Encounter: Payer: Self-pay | Admitting: Cardiology

## 2022-01-19 ENCOUNTER — Ambulatory Visit: Payer: Medicare Other | Admitting: *Deleted

## 2022-01-19 ENCOUNTER — Encounter: Payer: Self-pay | Admitting: Internal Medicine

## 2022-01-19 DIAGNOSIS — I480 Paroxysmal atrial fibrillation: Secondary | ICD-10-CM

## 2022-01-19 DIAGNOSIS — Z5181 Encounter for therapeutic drug level monitoring: Secondary | ICD-10-CM | POA: Diagnosis not present

## 2022-01-19 LAB — POCT INR: INR: 3.5 — AB (ref 2.0–3.0)

## 2022-01-19 NOTE — Patient Instructions (Addendum)
Description   Take 1/2 a tablet of warfarin tomorrow and then continue to take Warfarin 1.5 tablets daily except for 1 tablet on Wednesday. Recheck INR in 1 week.  Ablation 7/13. Coumadin Clinic# [phone]

## 2022-01-19 NOTE — Telephone Encounter (Signed)
Pulm triage (cc Dr Curt Bears)  Luis Brown sometimes could have heart implication. No implications with esbriet/pirfenidone other than fact if he has gI side effect and this was Dr Tomasa Blase concer based on his pharmacists review.   Plan   - I think he can do 1 tab tid with food till the procedure (max dose is 3 pills tid)- this way he gets started and very low risk for gi concern. If there is side effect he can stop medicine 2-4 days before procedure and it will go away  OR   - he can just wait till the 15th and then start per protocol

## 2022-01-20 ENCOUNTER — Telehealth (HOSPITAL_COMMUNITY): Payer: Self-pay | Admitting: *Deleted

## 2022-01-20 NOTE — Telephone Encounter (Signed)
Orders sent to Carolynn Sayers, RN with [facility] stating that okay to postpone dalbavancin infusion from 01/27/22 to 01/31/22 per Alfonse Spruce, [place].   Beryle Flock, RN

## 2022-01-20 NOTE — Telephone Encounter (Signed)
Reaching out to patient to offer assistance regarding upcoming cardiac imaging study; pt verbalizes understanding of appt date/time, parking situation and where to check in, pre-test NPO status; name and call back number provided for further questions should they arise  Gordy Clement RN Navigator Cardiac [place] [facility] [phone] office [phone] cell  Patient aware to arrive at 1pm.

## 2022-01-20 NOTE — Telephone Encounter (Signed)
That is fine 

## 2022-01-21 ENCOUNTER — Ambulatory Visit (HOSPITAL_COMMUNITY)
Admission: RE | Admit: 2022-01-21 | Discharge: 2022-01-21 | Disposition: A | Discharge status: Home / Self Care | Payer: Medicare Other | Source: Ambulatory Visit | Attending: Cardiology | Admitting: Cardiology

## 2022-01-21 DIAGNOSIS — R0602 Shortness of breath: Secondary | ICD-10-CM | POA: Diagnosis not present

## 2022-01-21 DIAGNOSIS — J9601 Acute respiratory failure with hypoxia: Secondary | ICD-10-CM | POA: Diagnosis not present

## 2022-01-21 DIAGNOSIS — I480 Paroxysmal atrial fibrillation: Secondary | ICD-10-CM | POA: Diagnosis not present

## 2022-01-21 MED ORDER — IOHEXOL 350 MG/ML SOLN
100.0000 mL | Freq: Once | INTRAVENOUS | Status: AC | PRN
  Administered 2022-01-21: 100 mL via INTRAVENOUS

## 2022-01-24 ENCOUNTER — Telehealth: Payer: Self-pay | Admitting: Medical

## 2022-01-24 DIAGNOSIS — G4730 Sleep apnea, unspecified: Secondary | ICD-10-CM | POA: Diagnosis not present

## 2022-01-24 DIAGNOSIS — R0683 Snoring: Secondary | ICD-10-CM | POA: Diagnosis not present

## 2022-01-24 NOTE — Telephone Encounter (Signed)
Opened to review 

## 2022-01-24 NOTE — Telephone Encounter (Signed)
Called patient regarding Esbriet application - states that he hasn't yet started due to upcoming cardiac ablation. Will f/u after procedure.  Knox Saliva, PharmD, MPH, BCPS, CPP Clinical Pharmacist (Rheumatology and Pulmonology)

## 2022-01-25 ENCOUNTER — Other Ambulatory Visit: Payer: Self-pay | Admitting: *Deleted

## 2022-01-25 DIAGNOSIS — I480 Paroxysmal atrial fibrillation: Secondary | ICD-10-CM

## 2022-01-25 DIAGNOSIS — Z01812 Encounter for preprocedural laboratory examination: Secondary | ICD-10-CM

## 2022-01-26 ENCOUNTER — Ambulatory Visit: Payer: Medicare Other | Admitting: *Deleted

## 2022-01-26 DIAGNOSIS — I480 Paroxysmal atrial fibrillation: Secondary | ICD-10-CM | POA: Diagnosis not present

## 2022-01-26 DIAGNOSIS — I4891 Unspecified atrial fibrillation: Secondary | ICD-10-CM

## 2022-01-26 DIAGNOSIS — Z7901 Long term (current) use of anticoagulants: Secondary | ICD-10-CM

## 2022-01-26 DIAGNOSIS — M4325 Fusion of spine, thoracolumbar region: Secondary | ICD-10-CM | POA: Diagnosis not present

## 2022-01-26 DIAGNOSIS — M4635 Infection of intervertebral disc (pyogenic), thoracolumbar region: Secondary | ICD-10-CM | POA: Diagnosis not present

## 2022-01-26 DIAGNOSIS — G062 Extradural and subdural abscess, unspecified: Secondary | ICD-10-CM | POA: Diagnosis not present

## 2022-01-26 LAB — POCT INR: INR: 3 (ref 2.0–3.0)

## 2022-01-26 LAB — BASIC METABOLIC PANEL
BUN/Creatinine Ratio: 13 (ref 10–24)
BUN: 10 mg/dL (ref 8–27)
CO2: 20 mmol/L (ref 20–29)
Calcium: 9.4 mg/dL (ref 8.6–10.2)
Chloride: 96 mmol/L (ref 96–106)
Creatinine, Ser: 0.76 mg/dL (ref 0.76–1.27)
Glucose: 191 mg/dL — ABNORMAL HIGH (ref 70–99)
Potassium: 4.5 mmol/L (ref 3.5–5.2)
Sodium: 134 mmol/L (ref 134–144)
eGFR: 92 mL/min/{1.73_m2} (ref >59)

## 2022-01-26 LAB — MAGNESIUM: Magnesium: 2 mg/dL (ref 1.6–2.3)

## 2022-01-26 NOTE — Patient Instructions (Addendum)
Description   Continue taking Warfarin 1.5 tablets daily except for 1 tablet on Wednesday. Recheck INR in 1 week post procedure-Ablation 7/13. Coumadin Clinic# [phone]

## 2022-01-26 NOTE — Pre-Procedure Instructions (Signed)
Attempted to call patient regarding procedure instructions.  Left voice mail on the following items: Arrival time 0830 Nothing to eat or drink after midnight No meds AM of procedure Responsible person to drive you home and stay with you for 24 hrs  Have you missed any doses of anti-coagulant Coumadin, take doses tonight,

## 2022-01-27 ENCOUNTER — Other Ambulatory Visit: Payer: Self-pay

## 2022-01-27 ENCOUNTER — Ambulatory Visit (HOSPITAL_COMMUNITY)
Admission: RE | Admit: 2022-01-27 | Discharge: 2022-01-27 | Disposition: A | Discharge status: Home / Self Care | Payer: Medicare Other | Attending: Cardiology | Admitting: Cardiology

## 2022-01-27 ENCOUNTER — Encounter (HOSPITAL_COMMUNITY)
Admission: RE | Disposition: A | Discharge status: Home / Self Care | Payer: Medicare Other | Source: Home / Self Care | Attending: Cardiology

## 2022-01-27 ENCOUNTER — Ambulatory Visit (HOSPITAL_BASED_OUTPATIENT_CLINIC_OR_DEPARTMENT_OTHER): Payer: Medicare Other | Admitting: Certified Registered Nurse Anesthetist

## 2022-01-27 ENCOUNTER — Encounter (HOSPITAL_COMMUNITY): Payer: Self-pay | Admitting: Cardiology

## 2022-01-27 ENCOUNTER — Ambulatory Visit (HOSPITAL_COMMUNITY): Payer: Medicare Other | Admitting: Certified Registered Nurse Anesthetist

## 2022-01-27 ENCOUNTER — Telehealth: Payer: Self-pay | Admitting: Primary Care

## 2022-01-27 DIAGNOSIS — J449 Chronic obstructive pulmonary disease, unspecified: Secondary | ICD-10-CM

## 2022-01-27 DIAGNOSIS — I4891 Unspecified atrial fibrillation: Secondary | ICD-10-CM

## 2022-01-27 DIAGNOSIS — Z87891 Personal history of nicotine dependence: Secondary | ICD-10-CM

## 2022-01-27 DIAGNOSIS — Z8711 Personal history of peptic ulcer disease: Secondary | ICD-10-CM | POA: Diagnosis not present

## 2022-01-27 DIAGNOSIS — I480 Paroxysmal atrial fibrillation: Secondary | ICD-10-CM | POA: Insufficient documentation

## 2022-01-27 DIAGNOSIS — I10 Essential (primary) hypertension: Secondary | ICD-10-CM

## 2022-01-27 DIAGNOSIS — E1142 Type 2 diabetes mellitus with diabetic polyneuropathy: Secondary | ICD-10-CM | POA: Insufficient documentation

## 2022-01-27 DIAGNOSIS — E785 Hyperlipidemia, unspecified: Secondary | ICD-10-CM | POA: Diagnosis not present

## 2022-01-27 DIAGNOSIS — K219 Gastro-esophageal reflux disease without esophagitis: Secondary | ICD-10-CM | POA: Diagnosis not present

## 2022-01-27 HISTORY — PX: ATRIAL FIBRILLATION ABLATION: EP1191

## 2022-01-27 LAB — POCT ACTIVATED CLOTTING TIME
Activated Clotting Time: 432 seconds
Activated Clotting Time: 347 seconds

## 2022-01-27 LAB — CBC
HCT: 35.9 % — ABNORMAL LOW (ref 39.0–52.0)
Hemoglobin: 12.1 g/dL — ABNORMAL LOW (ref 13.0–17.0)
MCH: 29.4 pg (ref 26.0–34.0)
MCHC: 33.7 g/dL (ref 30.0–36.0)
MCV: 87.1 fL (ref 80.0–100.0)
Platelets: 271 10*3/uL (ref 150–400)
RBC: 4.12 MIL/uL — ABNORMAL LOW (ref 4.22–5.81)
RDW: 15.4 % (ref 11.5–15.5)
WBC: 5.4 10*3/uL (ref 4.0–10.5)
nRBC: 0 % (ref 0.0–0.2)

## 2022-01-27 LAB — GLUCOSE, CAPILLARY
Glucose-Capillary: 151 mg/dL — ABNORMAL HIGH (ref 70–99)
Glucose-Capillary: 193 mg/dL — ABNORMAL HIGH (ref 70–99)

## 2022-01-27 LAB — PROTIME-INR
INR: 2.5 — ABNORMAL HIGH (ref 0.8–1.2)
Prothrombin Time: 26.9 seconds — ABNORMAL HIGH (ref 11.4–15.2)

## 2022-01-27 SURGERY — ATRIAL FIBRILLATION ABLATION
Anesthesia: General

## 2022-01-27 MED ORDER — ROCURONIUM BROMIDE 10 MG/ML (PF) SYRINGE
PREFILLED_SYRINGE | INTRAVENOUS | Status: DC | PRN
  Administered 2022-01-27: 50 mg via INTRAVENOUS

## 2022-01-27 MED ORDER — SUGAMMADEX SODIUM 200 MG/2ML IV SOLN
INTRAVENOUS | Status: DC | PRN
  Administered 2022-01-27: 200 mg via INTRAVENOUS

## 2022-01-27 MED ORDER — ONDANSETRON HCL 4 MG/2ML IJ SOLN
INTRAMUSCULAR | Status: DC | PRN
  Administered 2022-01-27: 4 mg via INTRAVENOUS

## 2022-01-27 MED ORDER — OXYCODONE-ACETAMINOPHEN 5-325 MG PO TABS
1.0000 | ORAL_TABLET | Freq: Once | ORAL | Status: AC
  Administered 2022-01-27: 1 via ORAL
  Filled 2022-01-27: qty 1

## 2022-01-27 MED ORDER — HEPARIN SODIUM (PORCINE) 1000 UNIT/ML IJ SOLN
INTRAMUSCULAR | Status: DC | PRN
  Administered 2022-01-27: 1400 [IU] via INTRAVENOUS
  Administered 2022-01-27: 1000 [IU] via INTRAVENOUS

## 2022-01-27 MED ORDER — SODIUM CHLORIDE 0.9% FLUSH: 3.0000 mL | Freq: Two times a day (BID) | INTRAVENOUS | Status: DC

## 2022-01-27 MED ORDER — PROTAMINE SULFATE 10 MG/ML IV SOLN
INTRAVENOUS | Status: DC | PRN
  Administered 2022-01-27: 40 mg via INTRAVENOUS

## 2022-01-27 MED ORDER — PHENYLEPHRINE 80 MCG/ML (10ML) SYRINGE FOR IV PUSH (FOR BLOOD PRESSURE SUPPORT)
PREFILLED_SYRINGE | INTRAVENOUS | Status: DC | PRN
  Administered 2022-01-27 (×2): 80 ug via INTRAVENOUS

## 2022-01-27 MED ORDER — ACETAMINOPHEN 325 MG PO TABS: 650.0000 mg | ORAL_TABLET | ORAL | Status: DC | PRN

## 2022-01-27 MED ORDER — SODIUM CHLORIDE 0.9% FLUSH: 3.0000 mL | INTRAVENOUS | Status: DC | PRN

## 2022-01-27 MED ORDER — HEPARIN SODIUM (PORCINE) 1000 UNIT/ML IJ SOLN
INTRAMUSCULAR | Status: AC
  Filled 2022-01-27: qty 10

## 2022-01-27 MED ORDER — SODIUM CHLORIDE 0.9 % IV SOLN: INTRAVENOUS | Status: DC

## 2022-01-27 MED ORDER — HEPARIN (PORCINE) IN NACL 1000-0.9 UT/500ML-% IV SOLN
INTRAVENOUS | Status: AC
  Filled 2022-01-27: qty 2000

## 2022-01-27 MED ORDER — DOBUTAMINE INFUSION FOR EP/ECHO/NUC (1000 MCG/ML)
INTRAVENOUS | Status: AC
  Filled 2022-01-27: qty 250

## 2022-01-27 MED ORDER — PROPOFOL 10 MG/ML IV BOLUS
INTRAVENOUS | Status: DC | PRN
  Administered 2022-01-27: 100 mg via INTRAVENOUS
  Administered 2022-01-27: 50 mg via INTRAVENOUS

## 2022-01-27 MED ORDER — FENTANYL CITRATE (PF) 100 MCG/2ML IJ SOLN
INTRAMUSCULAR | Status: DC | PRN
  Administered 2022-01-27: 100 ug via INTRAVENOUS

## 2022-01-27 MED ORDER — DOBUTAMINE INFUSION FOR EP/ECHO/NUC (1000 MCG/ML)
INTRAVENOUS | Status: DC | PRN
  Administered 2022-01-27: 10 ug/kg/min via INTRAVENOUS

## 2022-01-27 MED ORDER — HEPARIN SODIUM (PORCINE) 1000 UNIT/ML IJ SOLN
INTRAMUSCULAR | Status: DC | PRN
  Administered 2022-01-27: 1000 [IU] via INTRAVENOUS

## 2022-01-27 MED ORDER — PHENYLEPHRINE HCL-NACL 20-0.9 MG/250ML-% IV SOLN
INTRAVENOUS | Status: DC | PRN
  Administered 2022-01-27: 30 ug/min via INTRAVENOUS

## 2022-01-27 MED ORDER — ONDANSETRON HCL 4 MG/2ML IJ SOLN: 4.0000 mg | Freq: Four times a day (QID) | INTRAMUSCULAR | Status: DC | PRN

## 2022-01-27 MED ORDER — COLCHICINE 0.6 MG PO TABS: 0.6000 mg | ORAL_TABLET | Freq: Two times a day (BID) | ORAL | 0 refills | Status: DC

## 2022-01-27 MED ORDER — EPHEDRINE SULFATE-NACL 50-0.9 MG/10ML-% IV SOSY
PREFILLED_SYRINGE | INTRAVENOUS | Status: DC | PRN
  Administered 2022-01-27: 10 mg via INTRAVENOUS
  Administered 2022-01-27 (×2): 2.5 mg via INTRAVENOUS
  Administered 2022-01-27: 5 mg via INTRAVENOUS

## 2022-01-27 MED ORDER — COLCHICINE 0.6 MG PO TABS
0.6000 mg | ORAL_TABLET | Freq: Once | ORAL | Status: AC
  Administered 2022-01-27: 0.6 mg via ORAL
  Filled 2022-01-27: qty 1

## 2022-01-27 MED ORDER — SODIUM CHLORIDE 0.9 % IV SOLN: 250.0000 mL | INTRAVENOUS | Status: DC | PRN

## 2022-01-27 MED ORDER — DEXAMETHASONE SODIUM PHOSPHATE 10 MG/ML IJ SOLN
INTRAMUSCULAR | Status: DC | PRN
  Administered 2022-01-27: 4 mg via INTRAVENOUS

## 2022-01-27 MED ORDER — LIDOCAINE 2% (20 MG/ML) 5 ML SYRINGE
INTRAMUSCULAR | Status: DC | PRN
  Administered 2022-01-27: 60 mg via INTRAVENOUS

## 2022-01-27 MED ORDER — HEPARIN (PORCINE) IN NACL 1000-0.9 UT/500ML-% IV SOLN
INTRAVENOUS | Status: DC | PRN
  Administered 2022-01-27 (×4): 500 mL

## 2022-01-27 SURGICAL SUPPLY — 21 items
BAG SNAP BAND KOVER 36X36 (MISCELLANEOUS) ×1 IMPLANT
BLANKET WARM UNDERBOD FULL ACC (MISCELLANEOUS) ×2 IMPLANT
CATH 8FR REPROCESSED SOUNDSTAR (CATHETERS) ×2 IMPLANT
CATH 8FR SOUNDSTAR REPROCESSED (CATHETERS) IMPLANT
CATH ABLAT QDOT MICRO BI TC DF (CATHETERS) ×1 IMPLANT
CATH OCTARAY 2.0 F 3-3-3-3-3 (CATHETERS) ×1 IMPLANT
CATH S CIRCA THERM PROBE 10F (CATHETERS) ×1 IMPLANT
CATH WEB BI DIR CSDF CRV REPRO (CATHETERS) ×1 IMPLANT
CLOSURE PERCLOSE PROSTYLE (VASCULAR PRODUCTS) ×4 IMPLANT
COVER SWIFTLINK CONNECTOR (BAG) ×2 IMPLANT
KIT SHEA VERSACROSS D1 WIRE (KITS) ×1 IMPLANT
PACK EP LATEX FREE (CUSTOM PROCEDURE TRAY) ×2
PACK EP LF (CUSTOM PROCEDURE TRAY) ×1 IMPLANT
PAD DEFIB RADIO PHYSIO CONN (PAD) ×2 IMPLANT
PATCH CARTO3 (PAD) ×1 IMPLANT
SHEATH CARTO VIZIGO SM CVD (SHEATH) ×1 IMPLANT
SHEATH PINNACLE 7F 10CM (SHEATH) ×1 IMPLANT
SHEATH PINNACLE 8F 10CM (SHEATH) ×2 IMPLANT
SHEATH PINNACLE 9F 10CM (SHEATH) ×1 IMPLANT
SHEATH PROBE COVER 6X72 (BAG) ×1 IMPLANT
TUBING SMART ABLATE COOLFLOW (TUBING) ×1 IMPLANT

## 2022-01-27 NOTE — Anesthesia Preprocedure Evaluation (Addendum)
Anesthesia Evaluation  Patient identified by MRN, date of birth, ID band Patient awake    Reviewed: Allergy & Precautions, NPO status , Patient's Chart, lab work & pertinent test results  History of Anesthesia Complications Negative for: history of anesthetic complications  Airway Mallampati: II  TM Distance: >3 FB Neck ROM: Full    Dental  (+) Teeth Intact, Dental Advisory Given   Pulmonary neg shortness of breath, asthma , neg sleep apnea, COPD,  COPD inhaler, neg recent URI, former smoker,    breath sounds clear to auscultation       Cardiovascular hypertension, (-) angina(-) Past MI + dysrhythmias Atrial Fibrillation  Rhythm:Regular  1. Left ventricular ejection fraction, by estimation, is 60 to 65%. The  left ventricle has normal function. The left ventricle has no regional  wall motion abnormalities. Left ventricular diastolic parameters are  indeterminate.  2. Right ventricular systolic function is normal. The right ventricular  size is normal. There is normal pulmonary artery systolic pressure.  3. The mitral valve is normal in structure. No evidence of mitral valve  regurgitation. No evidence of mitral stenosis.  4. The aortic valve is tricuspid. Aortic valve regurgitation is not  visualized. No aortic stenosis is present.  5. The inferior vena cava is normal in size with greater than 50%  respiratory variability, suggesting right atrial pressure of 3 mmHg.    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  Neuromuscular disease    GI/Hepatic Neg liver ROS, PUD, GERD  Medicated and Controlled,  Endo/Other  diabetes, Type 2  Renal/GU negative Renal ROSLab Results      Component                Value               Date                      CREATININE               0.76                01/25/2022                Musculoskeletal  (+) Arthritis ,   Abdominal   Peds   Hematology  (+) Blood dyscrasia, anemia , Lab Results      Component                Value               Date                      WBC                      5.4                 01/27/2022                HGB                      12.1 (L)            01/27/2022                HCT                      35.9 (L)  01/27/2022                MCV                      87.1                01/27/2022                PLT                      271                 01/27/2022            Coumadin Lab Results      Component                Value               Date                      INR                      2.5 (H)             01/27/2022                INR                      3.0                 01/26/2022                INR                      3.5 (A)             01/19/2022              Anesthesia Other Findings Chronic narcotic (1 oxy every 4 hours)  Reproductive/Obstetrics                           Anesthesia Physical Anesthesia Plan  ASA: 3  Anesthesia Plan: General   Post-op Pain Management: Minimal or no pain anticipated   Induction: Intravenous  PONV Risk Score and Plan: 2 and Dexamethasone and Ondansetron  Airway Management Planned: Oral ETT  Additional Equipment: None  Intra-op Plan:   Post-operative Plan: Extubation in OR  Informed Consent: I have reviewed the patients History and Physical, chart, labs and discussed the procedure including the risks, benefits and alternatives for the proposed anesthesia with the patient or authorized representative who has indicated his/her understanding and acceptance.     Dental advisory given  Plan Discussed with: CRNA  Anesthesia Plan Comments:        Anesthesia Quick Evaluation

## 2022-01-27 NOTE — Telephone Encounter (Signed)
Called and spoke with pt letting him know the results of the ONO and he verbalized understanding. Nothing further needed. °

## 2022-01-27 NOTE — Discharge Instructions (Signed)

## 2022-01-27 NOTE — Transfer of Care (Signed)
Immediate Anesthesia Transfer of Care Note  Patient: Luis Brown  Procedure(s) Performed: ATRIAL FIBRILLATION ABLATION  Patient Location: PACU and Cath Lab  Anesthesia Type:General  Level of Consciousness: awake and drowsy  Airway & Oxygen Therapy: Patient Spontanous Breathing and Patient connected to nasal cannula oxygen  Post-op Assessment: Report given to RN, Post -op Vital signs reviewed and stable and Patient moving all extremities  Post vital signs: Reviewed and stable  Last Vitals: See pacu vitals. Vitals Value Taken Time  BP    Temp    Pulse 71   Resp    SpO2 93     Last Pain:  Vitals:   01/27/22 0935  TempSrc:   PainSc: 5          Complications: There were no known notable events for this encounter.

## 2022-01-27 NOTE — Anesthesia Postprocedure Evaluation (Signed)
Anesthesia Post Note  Patient: Luis Brown  Procedure(s) Performed: ATRIAL FIBRILLATION ABLATION     Patient location during evaluation: Cath Lab Anesthesia Type: General Level of consciousness: awake and alert Pain management: pain level controlled Vital Signs Assessment: post-procedure vital signs reviewed and stable Respiratory status: spontaneous breathing, nonlabored ventilation and respiratory function stable Cardiovascular status: blood pressure returned to baseline and stable Postop Assessment: no apparent nausea or vomiting Anesthetic complications: no   There were no known notable events for this encounter.  Last Vitals:  Vitals:   01/27/22 1325 01/27/22 1330  BP: 137/62 (!) 144/65  Pulse: 84 82  Resp: (!) 24 14  Temp:    SpO2: 92% 92%    Last Pain:  Vitals:   01/27/22 1323  TempSrc: Temporal  PainSc: 0-No pain                 Amanee Iacovelli,W. EDMOND

## 2022-01-27 NOTE — Telephone Encounter (Signed)
Overnight oximetry test on Room air from 01/24/22 showed that patient spent 5 minutes with SPO2 less than 88%. SPO2 low 85%, baseline 93%.  Patient needs to continue to wear 1L supplemental oxygen at bedtime.

## 2022-01-27 NOTE — Anesthesia Procedure Notes (Signed)
Procedure Name: Intubation Date/Time: 01/27/2022 10:46 AM  Performed by: Dorthea Cove, CRNAPre-anesthesia Checklist: Patient identified, Emergency Drugs available, Suction available and Patient being monitored Patient Re-evaluated:Patient Re-evaluated prior to induction Oxygen Delivery Method: Circle System Utilized Preoxygenation: Pre-oxygenation with 100% oxygen Induction Type: IV induction Ventilation: Mask ventilation without difficulty Laryngoscope Size: Mac and 4 Grade View: Grade I Tube type: Oral Number of attempts: 1 Airway Equipment and Method: Stylet Placement Confirmation: ETT inserted through vocal cords under direct vision, positive ETCO2 and breath sounds checked- equal and bilateral Secured at: 22 cm Tube secured with: Tape Dental Injury: Teeth and Oropharynx as per pre-operative assessment

## 2022-01-27 NOTE — H&P (Signed)
Electrophysiology Office Note   Date:  01/27/2022   ID:  Luis Brown, DOB 1946-07-03, MRN [id number]  PCP:  Mackie Pai, PA-C  Cardiologist:  Harriet Masson Primary Electrophysiologist:  Izabella Marcantel Meredith Leeds, MD    Chief Complaint: AF   History of Present Illness: Luis Brown is a 76 y.o. male who is being seen today for the evaluation of AF at the request of No ref. provider found. Presenting today for electrophysiology evaluation.  He has a history significant for atrial fibrillation, hypertension, diabetes, hyperlipidemia.  He has had multiple episodes of atrial fibrillation with rapid rates.  He feels quite poorly when he is in atrial fibrillation.  He wore a Zio monitor with an atrial fibrillation burden of 3%.  He is planned for ablation 10/12/2021.  Today, denies symptoms of palpitations, chest pain, shortness of breath, orthopnea, PND, lower extremity edema, claudication, dizziness, presyncope, syncope, bleeding, or neurologic sequela. The patient is tolerating medications without difficulties. Plan ablation today.    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   Esophageal reflux 02/10/2015   Fatigue    HTN (hypertension)    Hyperglycemia    preDM    Hyperlipidemia    Insomnia    Low back pain radiating to both legs 01/16/2017   Measles as a child   Overweight(278.02)    Peripheral neuropathy 08/07/2019   Personal history of colonic polyps 10/27/2012   Follows with [facility]   Pneumonia    " walking"   Preventative  health care    Testosterone deficiency 05/23/2012   Wears glasses    Past Surgical History:  Procedure Laterality Date   BACK SURGERY  2012 and 1994   Dr Margret Chance, screws and cage in low back   BIOPSY  01/09/2020   Procedure: BIOPSY;  Surgeon: Otis Brace, MD;  Location: [facility];  Service: Gastroenterology;;   BRONCHIAL BIOPSY  11/17/2020   Procedure: BRONCHIAL BIOPSIES;  Surgeon: Laurin Coder, MD;  Location: [facility];  Service: Endoscopy;;   BRONCHIAL BRUSHINGS  11/17/2020   Procedure: BRONCHIAL BRUSHINGS;  Surgeon: Laurin Coder, MD;  Location: [facility];  Service: Endoscopy;;   BRONCHIAL WASHINGS  11/17/2020   Procedure: BRONCHIAL WASHINGS;  Surgeon: Laurin Coder, MD;  Location: [facility];  Service: Endoscopy;;   COLONOSCOPY WITH PROPOFOL N/A 01/09/2020   Procedure: COLONOSCOPY WITH PROPOFOL;  Surgeon: Otis Brace, MD;  Location: [facility];  Service: Gastroenterology;  Laterality: N/A;   ESOPHAGOGASTRODUODENOSCOPY (EGD) WITH PROPOFOL N/A 01/09/2020   Procedure: ESOPHAGOGASTRODUODENOSCOPY (EGD) WITH PROPOFOL;  Surgeon: Otis Brace, MD;  Location: [facility];  Service: Gastroenterology;  Laterality: N/A;   EYE SURGERY Bilateral    2016   HARDWARE REVISION  03/12/2019   REVISION OF HARDWARE THORACIC TEN-THORACIC ELEVEN WITH APPLICATION OF ADDITIONAL RODS AND ROD SLEEVES THORACIC EIGHT-THORACIC TWELVE 12-LUMBAR ONE (N/A )   HEMOSTASIS CONTROL  11/17/2020   Procedure: HEMOSTASIS CONTROL;  Surgeon: Laurin Coder, MD;  Location: [facility];  Service:  Endoscopy;;   IR US GUIDE BX ASP/DRAIN  11/13/2020   LAMINECTOMY  02/12/2019   DECOMPRESSIVE LAMINECTOMY THORACIC NINE-THORACIC TEN AND THORACIC TEN-THORACIC ELEVEN, EXTENSION OF THORACOLUMBAR FUSION FROM THORACIC TEN TO THORACIC FIVE, LOCAL BONE GRAFT, ALLOGRAFT AND VIVIGEN (N/A)   POLYPECTOMY  01/09/2020   Procedure: POLYPECTOMY;  Surgeon: Otis Brace, MD;  Location: [facility];  Service:  Gastroenterology;;   REMOVAL OF RODS AND PEDICLE SCREWS T5 AND T6, EXPLORATION OF FUSION, BIOPSY TRANSPEDICULAR T5 AND T6 (N/A )  09/14/2020   SPINAL FUSION N/A 02/12/2019   Procedure: DECOMPRESSIVE LAMINECTOMY THORACIC NINE-THORACIC TEN AND THORACIC TEN-THORACIC ELEVEN, EXTENSION OF THORACOLUMBAR FUSION FROM THORACIC TEN TO THORACIC FIVE, LOCAL BONE GRAFT, ALLOGRAFT AND VIVIGEN;  Surgeon: Jessy Oto, MD;  Location: [place];  Service: Orthopedics;  Laterality: N/A;  DECOMPRESSIVE LAMINECTOMY THORACIC NINE-THORACIC TEN AND THORACIC TEN-THORACIC ELEVEN, EXTENSION OF TH   SPINAL FUSION N/A 03/12/2019   Procedure: REVISION OF HARDWARE THORACIC TEN-THORACIC ELEVEN WITH APPLICATION OF ADDITIONAL RODS AND ROD SLEEVES THORACIC EIGHT-THORACIC TWELVE 12-LUMBAR ONE;  Surgeon: Jessy Oto, MD;  Location: [place];  Service: Orthopedics;  Laterality: N/A;   TONSILLECTOMY     as child   torn rotator cuff  2010   right   VIDEO BRONCHOSCOPY N/A 11/17/2020   Procedure: VIDEO BRONCHOSCOPY WITH FLUORO;  Surgeon: Laurin Coder, MD;  Location: [facility];  Service: Endoscopy;  Laterality: N/A;     Current Facility-Administered Medications  Medication Dose Route Frequency Provider Last Rate Last Admin   0.9 %  sodium chloride infusion   Intravenous Continuous Constance Haw, MD 50 mL/hr at 01/27/22 0941 New Bag at 01/27/22 0941    Allergies:   Daptomycin and Diltiazem   Social History:  The patient  reports that he quit smoking about 32 years ago. His smoking use included cigarettes. He has a 20.00 pack-year smoking history. He has never used smokeless tobacco. He reports that he does not currently use alcohol. He reports that he does not use drugs.   Family History:  The patient's family history includes COPD in his father; Cancer (age of onset: 60) in his mother; Diabetes in his mother; Emphysema in his father; Heart disease in his daughter, father, maternal grandfather, and paternal grandfather;  Hyperlipidemia in his sister; Hypertension in his maternal grandmother, mother, and sister; Osteoporosis in his sister; Scoliosis in his maternal grandmother and sister; Stroke (age of onset: 61) in his father.   ROS:  Please see the history of present illness.   Otherwise, review of systems is positive for none.   All other systems are reviewed and negative.   PHYSICAL EXAM: VS:  BP 127/81   Pulse 63   Temp 97.8 F (36.6 C) (Oral)   Resp 19   Ht '5\' 10"'$  (1.778 m)   Wt 79.4 kg   SpO2 97%   BMI 25.11 kg/m  , BMI Body mass index is 25.11 kg/m. GEN: Well nourished, well developed, in no acute distress  HEENT: normal  Neck: no JVD, carotid bruits, or masses Cardiac: RRR; no murmurs, rubs, or gallops,no edema  Respiratory:  clear to auscultation bilaterally, normal work of breathing GI: soft, nontender, nondistended, + BS MS: no deformity or atrophy  Skin: warm and dry Neuro:  Strength and sensation are intact Psych: euthymic mood, full affect    Recent Labs: 07/16/2021: TSH 1.816 07/17/2021: B Natriuretic Peptide 82.0 12/30/2021: ALT 14 01/25/2022: BUN 10; Creatinine, Ser 0.76; Magnesium 2.0; Potassium 4.5; Sodium 134 01/27/2022:  Hemoglobin 12.1; Platelets 271    Lipid Panel     Component Value Date/Time   CHOL 112 07/01/2020 0857   TRIG 66.0 07/01/2020 0857   HDL 56.10 07/01/2020 0857   CHOLHDL 2 07/01/2020 0857   VLDL 13.2 07/01/2020 0857   LDLCALC 42 07/01/2020 0857   LDLDIRECT 64.0 10/30/2017 1502     Wt Readings from Last 3 Encounters:  01/27/22 79.4 kg  12/30/21 80.3 kg  12/21/21 79.4 kg      Other studies Reviewed: Additional studies/ records that were reviewed today include: TTE 11/11/20  Review of the above records today demonstrates:   1. Left ventricular ejection fraction, by estimation, is 60 to 65%. The  left ventricle has normal function. The left ventricle has no regional  wall motion abnormalities. Left ventricular diastolic parameters were   normal.   2. Right ventricular systolic function is normal. The right ventricular  size is mildly enlarged.   3. The mitral valve is normal in structure. No evidence of mitral valve  regurgitation.   4. The aortic valve was not well visualized. Aortic valve regurgitation  is not visualized. No aortic stenosis is present.   5. The inferior vena cava is normal in size with greater than 50%  respiratory variability, suggesting right atrial pressure of 3 mmHg.   Cardiac monitor 02/16/2021 personally reviewed paroxysmal atrial fibrillation and occasional premature atrial complexes.  ASSESSMENT AND PLAN:  1.  Paroxysmal atrial fibrillation: Luis Brown has presented today for surgery, with the diagnosis of AF.  The various methods of treatment have been discussed with the patient and family. After consideration of risks, benefits and other options for treatment, the patient has consented to  Procedure(s): Catheter ablation as a surgical intervention .  Risks include but not limited to complete heart block, stroke, esophageal damage, nerve damage, bleeding, vascular damage, tamponade, perforation, MI, and death. The patient's history has been reviewed, patient examined, no change in status, stable for surgery.  I have reviewed the patient's chart and labs.  Questions were answered to the patient's satisfaction.    Luis Brown Curt Bears, MD 01/27/2022 9:53 AM

## 2022-01-28 ENCOUNTER — Encounter (HOSPITAL_COMMUNITY): Payer: Self-pay | Admitting: Cardiology

## 2022-01-29 ENCOUNTER — Encounter: Payer: Self-pay | Admitting: Internal Medicine

## 2022-01-31 ENCOUNTER — Ambulatory Visit: Payer: Medicare Other

## 2022-01-31 ENCOUNTER — Encounter: Payer: Self-pay | Admitting: Cardiology

## 2022-01-31 ENCOUNTER — Other Ambulatory Visit (HOSPITAL_COMMUNITY): Payer: Self-pay

## 2022-01-31 NOTE — Telephone Encounter (Signed)
Devki, please advise on pts email. He is concerned about a drug-drug interaction with the colchicine and Esbriet. In Micromedex there wasn't a listed interaction. Thanks.

## 2022-02-02 ENCOUNTER — Ambulatory Visit: Payer: Medicare Other

## 2022-02-02 DIAGNOSIS — Z7901 Long term (current) use of anticoagulants: Secondary | ICD-10-CM | POA: Diagnosis not present

## 2022-02-02 DIAGNOSIS — I480 Paroxysmal atrial fibrillation: Secondary | ICD-10-CM

## 2022-02-02 DIAGNOSIS — I4891 Unspecified atrial fibrillation: Secondary | ICD-10-CM | POA: Diagnosis not present

## 2022-02-02 LAB — POCT INR: INR: 2 (ref 2.0–3.0)

## 2022-02-02 NOTE — Patient Instructions (Signed)
Description   Eat an extra 0.5 tablet today and then continue taking Warfarin 1.5 tablets daily except for 1 tablet on Wednesday. Recheck INR in 2 weeks. [facility]# [phone]

## 2022-02-06 ENCOUNTER — Other Ambulatory Visit: Payer: Self-pay | Admitting: Cardiology

## 2022-02-07 ENCOUNTER — Encounter: Payer: Self-pay | Admitting: Medical

## 2022-02-08 DIAGNOSIS — G894 Chronic pain syndrome: Secondary | ICD-10-CM | POA: Diagnosis not present

## 2022-02-08 DIAGNOSIS — M150 Primary generalized (osteo)arthritis: Secondary | ICD-10-CM | POA: Diagnosis not present

## 2022-02-08 DIAGNOSIS — M961 Postlaminectomy syndrome, not elsewhere classified: Secondary | ICD-10-CM | POA: Diagnosis not present

## 2022-02-08 DIAGNOSIS — M4624 Osteomyelitis of vertebra, thoracic region: Secondary | ICD-10-CM | POA: Diagnosis not present

## 2022-02-08 DIAGNOSIS — Z79891 Long term (current) use of opiate analgesic: Secondary | ICD-10-CM | POA: Diagnosis not present

## 2022-02-09 MED ORDER — ZOLPIDEM TARTRATE 5 MG PO TABS: 5.0000 mg | ORAL_TABLET | Freq: Every evening | ORAL | 0 refills | Status: DC | PRN

## 2022-02-09 NOTE — Telephone Encounter (Addendum)
Requesting: ambien Contract: UDS:07/17/21 Last Visit:09/27/21 Next Visit:n/a Last Refill:01/05/22  Please Advise   Rx sent. Please have pt come in to sign contract for [employer].  Mackie Pai, PA-C

## 2022-02-10 ENCOUNTER — Encounter: Payer: Self-pay | Admitting: Primary Care

## 2022-02-10 ENCOUNTER — Ambulatory Visit: Payer: Medicare Other | Admitting: Primary Care

## 2022-02-10 VITALS — BP 122/62 | HR 72 | Temp 98.1°F | Ht 70.0 in | Wt 186.6 lb

## 2022-02-10 DIAGNOSIS — M4325 Fusion of spine, thoracolumbar region: Secondary | ICD-10-CM | POA: Diagnosis not present

## 2022-02-10 DIAGNOSIS — G4734 Idiopathic sleep related nonobstructive alveolar hypoventilation: Secondary | ICD-10-CM | POA: Diagnosis not present

## 2022-02-10 DIAGNOSIS — J84112 Idiopathic pulmonary fibrosis: Secondary | ICD-10-CM

## 2022-02-10 DIAGNOSIS — J439 Emphysema, unspecified: Secondary | ICD-10-CM | POA: Diagnosis not present

## 2022-02-10 DIAGNOSIS — G062 Extradural and subdural abscess, unspecified: Secondary | ICD-10-CM | POA: Diagnosis not present

## 2022-02-10 DIAGNOSIS — M4635 Infection of intervertebral disc (pyogenic), thoracolumbar region: Secondary | ICD-10-CM | POA: Diagnosis not present

## 2022-02-10 NOTE — Assessment & Plan Note (Addendum)
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 Probable UIP pattern. Started on Esbriet mid-July 2023. Currently on 2 tablets three times a day. Tolerating medication without GI side effects. He will increase to '801mg'$  TID next week. Continue prednisone '5mg'$  daily until follow-up, once he is on full dose of antifibrotic we will trial him off steroids. LFTs were normal in June. Repeat labs in 2-3 weeks. He will need repeat spirometry in 8-12 weeks. Advised patient to notify us if he develops any nausea, vomiting, diarrhea or weight loss. FU with Dr. Sheilah Pigeon in 6 weeks or sooner if needed.

## 2022-02-10 NOTE — Assessment & Plan Note (Addendum)
-   Overnight oximetry test on 01/24/22 showed that patient spent 5 min with SPO2 less than 88%. SPO2 low 85% and baseline 93%. Patient started on 1L supplemental oxygen at bedtime.

## 2022-02-10 NOTE — Patient Instructions (Addendum)
Recommendations: - Continue Esbriet 2 tablets three times a day x 1 week; then increase to 3 tablets three times a day  - Please come in for labs between week of Aug 14-19 (between 9-4:30 pm) - Continue 1/2 tab prednisone until follow-up, will trial you off at that visit once you are on full fose of Esbriet  - Use mucinex dm as needed for cough  - If you develop any nausea, vomiting, diarrhea or weight loss let our office know   Orders: - Hepatic liver function test (ordered- aug 14-19) - Spirometry with DLCO in 8-12 weeks (ordered)  Follow-up: - 6 weeks with Dr. Chase Caller

## 2022-02-10 NOTE — Assessment & Plan Note (Signed)
-   Stable; Continue Stiolto Respimat 2 puffs twice daily

## 2022-02-10 NOTE — Progress Notes (Signed)
$'@Patient'w$  ID: Luis Brown, male    DOB: [birth year], 76 y.o.   MRN: [id number]  Chief Complaint  Patient presents with   Follow-up    Occass. sob    Referring provider: Elise Benne  HPI: 76 year old male, former smoker (20-pack-year history).  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   Previous [facility] pulmonary encounters:  12/29/20- Dr. Chase Caller Chief Complaint  Patient presents with   Follow-up    PFT  performed 5/24.  Pt states since last visit, he has been doing okay. States he has lost some weight that he has noticed since last visit.    Follow-up drug-induced pneumonitis daptomycin from early May 2022  HPI Luis Brown 76 y.o. -returns for follow-up.  I personally saw him in the hospital on 11/20/2020.  At that point in time he had been started on Solu-Medrol on 11/18/2020.  When I saw him he was on day 3 of Solu-Medrol and he was needing 3 L nasal cannula oxygen at rest.  At that point in time even with 3 L he was desaturating to 86% just talking.  Put him on prednisone 60 mg/day.  After that he see nurse practitioner x2 at least 1 of which was a telephone visit..  As part of his follow-up on 12/11/2020 he had a CT scan of the chest [3 days after pulmonary function test] and on my personal visualization it showed new onset pneumothorax but significant improvement in infiltrates and inflammation.  He had another follow-up chest x-ray 12/17/2020 in which no pneumothorax reported [similar on 12/15/2020] and pulmonary infiltrates appear improved.  He is now here with his wife  He tells me he is now on 10 mg/day prednisone he just darted that today.  He is 1 week left and after that the prednisone taper ends.  Because of the pneumothorax is  physical therapy has been on hold but 4 days ago he was evaluated and apparently his pulse ox was 100% but this was on oxygen.  He continues to use his oxygen although he is not sure if he is desaturating anymore.  He does feel overall improved compared to 6 weeks ago but still has significant amount of fatigue.  He has significant insomnia which he believes is due to prednisone.  Today's last night was the first night he could sleep when he dropped his prednisone down to 10 mg/day.  He believes his fatigue is multifactorial related to the disease, multiple medications, lack of oxygen, insomnia etc.  02/09/2021 Patient presents today for 1 month follow-up. Since his last visit in June, he has established with cardiology and was dx with afib. He is currently on Xarelto and Digoxin. His wife states that he has had more energy recently. He is scheduled for testosterone injection tomorrow. He gets winded with stairs and heavy exertion. He reports intermittent wheezing. He needs refill of albuterol rescue inhaler. He has weaned himself of oxygen and O2 equipment has been picked up. He has been sleeping better at night. Anxiety has improved, he stopped xanax. Due for follow-up CXR today. 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  02/23/2021 Patient presents today for 2 week follow-up. During his last visit he was given trial Spiriva Respimat d/t dyspnea /wheezing symptoms and moderate obstruction seen on his PFTs. He has been  using sample of Spiriva Respimat 2.23mg as prescribed, he occasionally misses a dose for 1-2 days. He states that his wheezing has stopped but he has not noticed a significant difference in dyspnea symptoms. His ILD scale appears improved from two weeks go. He has having some muscle spasms in his back that started before being placed on LABA.   04/26/2021 -   Chief Complaint  Patient presents with   Follow-up    F/u, trelegy  medication concerns    Luis [id number].o. -returns for follow-up.  Since I last saw him he seen nurse practitioner 2 times.  Symptom scores for dyspnea continue to improve.  He was also walked for oxygen desaturation February 04, 2019 did not desaturate.  He says he continues to get better.  He is off prednisone.  Nurse practitioner noticed obstructive physiology on the pulmonary function test.  Put him on inhaler.  Currently is on Trelegy.  He says it helps him tremendously.  He feels it opens up his lungs.  He declined his flu shot today because he will have it after the COVID-vaccine in October or November 2022.  06/24/2021 Patient presents today for acute OV/cough. Patient reports increased shortness of breath since 06/13/21 with associated fatigue, cough and chest tightness. Cough is productive with clear mucus. Symptoms started after Thanksgiving. His wife had the flu at the same time. His cough initially improved while taking mucinex, he stopped since he was starting to feel better. Denies f/c/s, purulent mucus, N/V/D. CAT score 17.    09/28/2021 -   Chief Complaint  Patient presents with   Follow-up    Pt states he still becomes SOB with exertion and also has an occasional cough. States the cough happens throughout the day and will get some phlegm up that is clear to yellow tinge.   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 And PE with "ILD Falare" Dec 2022/Jan 2023   He also has paroxysmal A. fib, low testosterone, chronic hyponatremia, chronic opioid intake.,  Chronic Lyrica  HPI Luis [id number].o. -returns for follow-up.  Since his last visit he has continued on prednisone 10 mg/day.  He continues to have shortness of breath.  In fact this is unchanged.  He does tell me though starting spring season he has had allergic rhinitis.  Wife states he has seasonal allergies for many years.  He is also having worsening cough.  He took a  antihistamine and is somewhat better.  He continues to schedule Stiolto.  He has upcoming atrial fibrillation ablation end of March 2023.  Also in mid February 2023 had another CT angiogram chest that ruled out pulmonary embolism [done while on anticoagulation].  I personally visualized the CT chest and compared to late December 2023/early January 2023 the findings are better.  There might be some residual scarring and potential emphysema.  We walked him today and his walking desaturation test held up.  He did not desaturate.  There is a huge improvement.  Of note he was supposed have pulmonary function test today but this was not performed.  He does not know why.  Apologized for any missteps.    For his pulmonary embolism: It appears that he might not be taking his testosterone shots anymore.  Regarding seasonal allergies: He has not had work-up for this.  But currently it is active.     12/30/2021- Interim hx  Patient presents today for a 3 month follow-up with PFTs.  Patient needs simple walk test, symptom scale and FENO today.  Patient is maintained on Stiolto. Has not required albuterol. He has chronic shortness of breath and fatigue symptoms which are significantly better since last visit. No cough. He remains on '10mg'$  prednisone daily. No oxygen reqiuirments during daytime. Using 1L oxygen at bedtime.  HRCT on 12/28/2021 showed regression of subpleural and basilar predominant fibrotic interstitial lung disease compared to May 2022.  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 PFTs showed improvement in FEV1 but decreased diffusion capacity. Eosinophil absolute was 100 and March.  Respiratory allergy panel was negative. FENO today was normal. Discussed starting antifibrtoic to slow progression of pulmonary fibrosis.     02/10/2022- Interim hx  Patient presents today for 1 month follow-up/ new antifibrotic start. 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 He started Esbriet 1.5 weeks ago d/t gout flare. He is  tolerating medication well, currently on 2 tablets three times a day. No GI symptoms. Dyspnea appears some better overall. No acute respiratory symptoms. No active cough, mucinex-dm helps when needed. He took 3/4 mild walk the other day without issues. Continues on Stiolto, prn albuterol and '5mg'$  prednisone daily. Overnight oximetry test on 01/24/22 showed that patient spent 5 min with SPO2 less than 88%. SPO2 low 85% and baseline 93%. Patient started on 1L supplemental oxygen at bedtime.    SYMPTOM SCALE - ILD 08/12/2021 09/28/2021  12/30/2021  02/10/2022   Current weight   177lb   O2 use 2L [place] but at rest he was 95%  RA RA  Shortness of Breath 0 -> 5 scale with 5 being worst (score 6 If unable to do)     At rest 1 1 0 0  Simple tasks - showers, clothes change, eating, shaving 3 2 0 0  Household (dishes, doing bed, laundry) '3 2 1 '$ 0  Shopping xno 3 1 0  Walking level at own pace 2 1.5 0 0  Walking up Stairs 3 2.'5 2 1  '$ Total (30-36) Dyspnea Score '12 12 4 1  '$ How bad is your cough? Not often 3.5 Not often 0  How bad is your fatigue A lot 2 2 2.5  How bad is nausea 0 0 0 0  How bad is vomiting?  0 0 0 0  How bad is diarrhea? 0 0 0 0  How bad is anxiety? some 1 0 0  How bad is depression A little 0 0.5 0  Any chronic pain - if so where and how bad x  Back  Back    Simple office walk 185 feet x  3 laps goal with forehead probe 08/12/2021  09/28/2021  12/30/2021   O2 used ra ra RA  Number laps completed 1  3  Comments about pace steady  Steady  Resting Pulse Ox/HR 95% and 89/min 99% and HR 90 97%; HR 97   Final Pulse Ox/HR 86% and 122/min 94% and HR 114 98%; HR 120  Desaturated </= 88% no no No  Desaturated <= 3% points yes Yes, 5 ponts No  Got Tachycardic >/= 90/min yes yes Yes  Symptoms at end of test dyspnea Mild does None  Miscellaneous comments x improve x     Allergies  Allergen Reactions   Daptomycin     Drug induced pneumonia    Diltiazem Other (See Comments)    Passed out     Immunization History  Administered Date(s) Administered   Fluad Quad(high Dose 65+) 04/05/2019, 06/29/2020, 05/18/2021   Influenza Split 04/23/2012  Influenza Whole 02/16/2011   Influenza, High Dose Seasonal PF 05/06/2013, 03/25/2016, 05/05/2017, 06/07/2018   PFIZER(Purple Top)SARS-COV-2 Vaccination 08/23/2019, 09/13/2019, 06/07/2020   Pfizer Covid-19 Vaccine Bivalent Booster 78yr & up 04/29/2021   Pneumococcal Conjugate-13 03/25/2016   Pneumococcal Polysaccharide-23 01/20/2009, 10/19/2011   Td 07/09/2003, 07/18/2008   Tdap 07/20/2011   Zoster, Live 04/27/2016    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   Esophageal reflux 02/10/2015   Fatigue    HTN (hypertension)    Hyperglycemia    preDM    Hyperlipidemia    Insomnia    Low back pain radiating to both legs 01/16/2017   Measles as a child   Overweight(278.02)    Peripheral neuropathy 08/07/2019   Personal history of colonic polyps 10/27/2012   [facility]   Pneumonia    " walking"   Preventative health care    Testosterone deficiency 05/23/2012   Wears glasses     Tobacco History: Social History   Tobacco Use  Smoking Status Former   Packs/day: 1.00   Years: 20.00   Total pack years: 20.00   Types: Cigarettes   Quit date: 07/18/1989   Years since quitting: 32.5  Smokeless Tobacco Never   Counseling given: Not Answered   Outpatient Medications Prior to Visit  Medication Sig Dispense Refill   albuterol (PROVENTIL HFA) 108 (90 Base) MCG/ACT  inhaler TAKE 2 PUFFS BY MOUTH EVERY 6 HOURS AS NEEDED FOR WHEEZE OR SHORTNESS OF BREATH 8 g 5   atorvastatin (LIPITOR) 10 MG tablet TAKE 1 TABLET BY MOUTH EVERY DAY 90 tablet 1   colchicine 0.6 MG tablet Take 1 tablet (0.6 mg total) by mouth 2 (two) times daily for 14 days. 28 tablet 0   DALVANCE 500 MG SOLR Inject 500 mg into the vein every 14 (fourteen) days. Pt states this is a every 2 week antibiotic. Could not state how long therapy should be.     dextromethorphan-guaiFENesin (MUCINEX DM) 30-600 MG 12hr tablet Take 1 tablet by mouth 2 (two) times daily.     digoxin (LANOXIN) 0.125 MG tablet TAKE 1 TABLET BY MOUTH EVERY DAY 90 tablet 1   famciclovir (FAMVIR) 500 MG tablet Take 1 tablet (500 mg total) by mouth 3 (three) times daily. 21 tablet 0   glucose blood (CONTOUR NEXT TEST) test strip Check blood sugar once daily 100 each 12   insulin glargine (LANTUS) 100 UNIT/ML Solostar Pen Inject 10 Units into the skin daily. (Patient taking differently: Inject 10 Units into the skin every morning.) 15 mL 0   Insulin Pen Needle (B-D UF III MINI PEN NEEDLES) 31G X 5 MM MISC USE WITH LANTUS 100 each 0   metFORMIN (GLUCOPHAGE) 1000 MG tablet TAKE 1 TABLET (1,000 MG TOTAL) BY MOUTH TWICE A DAY WITH FOOD 180 tablet 0   oxyCODONE-acetaminophen (PERCOCET/ROXICET) 5-325 MG tablet Take 1 tablet by mouth every 4 (four) hours as needed for moderate pain.     pantoprazole (PROTONIX) 40  MG tablet TAKE 1 TABLET BY MOUTH TWICE A DAY BEFORE A MEAL 180 tablet 1   Pirfenidone 267 MG TABS 1 tablet three times daily for 7 days, then 2 tablets three times daily for 7 days, then 3 tablets three times daily. 207 tablet 0   predniSONE (DELTASONE) 10 MG tablet TAKE 1 TABLET (10 MG TOTAL) BY MOUTH DAILY WITH BREAKFAST. (Patient taking differently: Take 10 mg by mouth daily with breakfast. Taking 1/2 tablet daily) 30 tablet 3   pregabalin (LYRICA) 75 MG capsule TAKE 1 CAPSULE BY MOUTH TWICE A DAY (Patient taking differently: Take  75 mg by mouth 2 (two) times daily.) 60 capsule 3   sitaGLIPtin (JANUVIA) 50 MG tablet Take 1 tablet (50 mg total) by mouth daily. 30 tablet 3   STIOLTO RESPIMAT 2.5-2.5 MCG/ACT AERS Inhale 2 puffs into the lungs daily. 1 each 5   tamsulosin (FLOMAX) 0.4 MG CAPS capsule Take 2 capsules (0.8 mg total) by mouth daily. (Patient taking differently: Take 0.8 mg by mouth at bedtime.) 180 capsule 3   tiZANidine (ZANAFLEX) 4 MG tablet Take 1 tablet (4 mg total) by mouth every 6 (six) hours as needed. (Patient taking differently: Take 4 mg by mouth every 6 (six) hours as needed for muscle spasms.) 120 tablet 3   venlafaxine XR (EFFEXOR-XR) 150 MG 24 hr capsule Take 1 capsule (150 mg total) by mouth daily with breakfast. 90 capsule 0   warfarin (COUMADIN) 5 MG tablet TAKE 1 TO 2 TABLETS DAILY OR AS PRESCRIBED BY COUMADIN CLINIC (Patient taking differently: Take 5 mg by mouth every morning.  OR AS PRESCRIBED BY COUMADIN CLINIC) 130 tablet 0   zolpidem (AMBIEN) 5 MG tablet Take 1 tablet (5 mg total) by mouth at bedtime as needed. for sleep 30 tablet 0   Pirfenidone 267 MG TABS Take 3 tablets (801 mg total) by mouth with breakfast, with lunch, and with evening meal. MAINTENANCE RX 270 tablet 1   Facility-Administered Medications Prior to Visit  Medication Dose Route Frequency Provider Last Rate Last Admin   testosterone cypionate (DEPOTESTOSTERONE CYPIONATE) injection 200 mg  200 mg Intramuscular Q14 Days Saguier, Percell Miller, PA-C   200 mg at 07/06/21 9326    Review of Systems  Review of Systems  Constitutional:  Positive for fatigue.  HENT: Negative.    Respiratory:  Negative for cough, shortness of breath and wheezing.        Baseline mild dyspnea with exertion   Cardiovascular: Negative.     Physical Exam  BP 122/62 (BP Location: Left Arm, Cuff Size: Normal)   Pulse 72   Temp 98.1 F (36.7 C) (Temporal)   Ht '5\' 10"'$  (1.778 m)   Wt 186 lb 9.6 oz (84.6 kg)   SpO2 99%   BMI 26.77 kg/m  Physical  Exam Constitutional:      Appearance: Normal appearance.  HENT:     Head: Normocephalic and atraumatic.  Cardiovascular:     Rate and Rhythm: Normal rate and regular rhythm.  Pulmonary:     Effort: Pulmonary effort is normal.     Breath sounds: Rales present. No wheezing or rhonchi.     Comments: Distant crackles lower lungs  Musculoskeletal:        General: Normal range of motion.  Skin:    General: Skin is warm and dry.  Neurological:     General: No focal deficit present.     Mental Status: He is alert and oriented to person, place, and time.  Mental status is at baseline.  Psychiatric:        Mood and Affect: Mood normal.        Behavior: Behavior normal.        Thought Content: Thought content normal.        Judgment: Judgment normal.      Lab Results:  CBC    Component Value Date/Time   WBC 5.4 01/27/2022 0921   RBC 4.12 (L) 01/27/2022 0921   HGB 12.1 (L) 01/27/2022 0921   HGB 11.9 (L) 10/05/2021 1429   HGB 12.8 (L) 11/18/2020 1608   HCT 35.9 (L) 01/27/2022 0921   PLT 271 01/27/2022 0921   PLT 322 10/05/2021 1429   MCV 87.1 01/27/2022 0921   MCH 29.4 01/27/2022 0921   MCHC 33.7 01/27/2022 0921   RDW 15.4 01/27/2022 0921   LYMPHSABS 0.8 10/05/2021 1429   MONOABS 0.9 10/05/2021 1429   EOSABS 0.2 10/05/2021 1429   BASOSABS 0.1 10/05/2021 1429    BMET    Component Value Date/Time   NA 134 01/25/2022 1416   K 4.5 01/25/2022 1416   CL 96 01/25/2022 1416   CO2 20 01/25/2022 1416   GLUCOSE 191 (H) 01/25/2022 1416   GLUCOSE 241 (H) 10/05/2021 1429   BUN 10 01/25/2022 1416   CREATININE 0.76 01/25/2022 1416   CREATININE 0.85 10/05/2021 1429   CREATININE 0.66 (L) 03/15/2021 1519   CALCIUM 9.4 01/25/2022 1416   GFRNONAA >60 10/05/2021 1429   GFRNONAA 84 12/04/2020 1523   GFRAA 98 12/04/2020 1523    BNP    Component Value Date/Time   BNP 82.0 07/17/2021 0508    ProBNP No results found for: "PROBNP"  Imaging: EP STUDY  Result Date:  01/27/2022 SURGEON:  Allegra Lai, MD PREPROCEDURE DIAGNOSES: 1. Paroxysmal atrial fibrillation. POSTPROCEDURE DIAGNOSES: 1. Paroxysmal atrial fibrillation. PROCEDURES: 1. Comprehensive electrophysiologic study. 2. Coronary sinus pacing and recording. 3. Three-dimensional mapping of atrial fibrillation (with additional mapping and ablation within the left atrium due to persistence of afib) 4. Ablation of atrial fibrillation (with additional mapping and ablation within the left atrium due to persistence of afib) 5. Intracardiac echocardiography. 6. Transseptal puncture of an intact septum. 7. Arrhythmia induction with pacing INTRODUCTION:  VISENTE KIRKER is a 76 y.o. male with a history of persistent atrial fibrillation who now presents for EP study and radiofrequency ablation.  The patient reports initially being diagnosed with atrial fibrillation after presenting with symptomatic palpitations and fatgiue.  The patient has failed medical therapy.  The patient therefore presents today for catheter ablation of atrial fibrillation. DESCRIPTION OF PROCEDURE:  Informed written consent was obtained, and the patient was brought to the electrophysiology lab in a fasting state.  The patient was adequately sedated with intravenous medications as outlined in the anesthesia report.  The patient's left and right groins were prepped and draped in the usual sterile fashion by the EP lab staff.  Using a percutaneous Seldinger technique, two 8-French hemostasis sheaths were placed in the right femoral vein, and one 7 [place] and one 11-French hemostasis sheaths were placed into the left common femoral vein. An esophageal temperature probe was inserted to monitor for heating of the esophagus during the procedure. Direct ultrasound guidance is used for right and left femoral veins with normal vessel patency. Ultrasound images are captured and stored in the patient's chart. Using ultrasound guidance, the Brockenbrough needle and wire  were visualized entering the vessel. Catheter Placement:  A 7-French [employer] Decapolar coronary sinus catheter  was introduced through the right common femoral vein and advanced into the coronary sinus for recording and pacing from this location.  A luminal esophageal temperature probe was placed and used for continuous monitoring of the luminal esophageal temperature throughout the procedure as well as to localize the esophagus on fluoroscopy. In addition, the esophagus was directly visualized with intracardiac echo and its positioned marked on Carto.  During ablation at the posterior wall there was limited esophageal heating noted during RF energy delivery with the maximal temperature recorded by the luminal temperature probe of < 38.5 degrees C. Initial Measurements: The patient presented to the electrophysiology lab in sinus rhythm. his  PR interval measured 144 msec with a QRS duration of 118 msec and a QT interval of 420 msec.     Intracardiac Echocardiography: An 8-French [employer] AcuNav intracardiac echocardiography catheter was introduced through the right common femoral vein and advanced into the right atrium. Intracardiac echocardiography was performed of the left atrium, and a three-dimensional anatomical rendering of the left atrium was performed using CARTO sound technology.  The patient was noted to have a moderate sized left atrium.  The interatrial septum was prominent but not aneurysmal. All 4 pulmonary veins were visualized and noted to have separate ostia.  The pulmonary veins were moderate in size.  The left atrial appendage was visualized and did not reveal thrombus.   There was no evidence of pulmonary vein stenosis. Transseptal Puncture: The right common femoral vein sheaths were exchanged for one 8.5 [employer] and one Bayless sheath and transseptal access was achieved with the Bayless in a standard fashion using a Bayless needle under fluoroscopy with  intracardiac echocardiography confirmation of the transseptal puncture.  Once transseptal access had been achieved, heparin was administered intravenously and intra- arterially in order to maintain an ACT of greater than 350 seconds throughout the procedure.  3D Mapping and Ablation: A 3.5 mm Biosense [employer] ST/SF Thermocool ablation catheter was advanced into the right atrium through the Visigo sheath.  The transseptal sheath was pulled back into the IVC over a guidewire.  The ablation catheter was advanced across the transseptal hole using the wire as a guide.  The transseptal sheath was then re-advanced over the guidewire into the left atrium.  A Biosense [employer] mapping catheter was introduced through the transseptal sheath and positioned over the mouth of all 4 pulmonary veins.  Three-dimensional electroanatomical mapping was performed using CARTO technology.  This demonstrated electrical activity within all four pulmonary veins at baseline. The patient underwent successful sequential electrical isolation and anatomical encircling of all four pulmonary veins using radiofrequency current with a circular mapping catheter as a guide. A WACA approach was used. Due to close proximity of the posterior wall lines, additional left atrial mapping and ablation was performed.  A series of radiofrequency lesions were delivered along the roof and floor of the left atrium in order to create a "standard box" lesion along the posterior wall of the left atrium. 20 mcg/kg/min of dobutamine was infused without arrhythmia induced. Measurements Following Ablation: In sinus rhythm the RR interval was 857mec, with PR 133 msec, QRS 102 msec, and QT 437 msec.  Following ablation the AH interval measured 49 msec with an HV interval of 49 msec. Ventricular pacing was performed, which revealed midline decremental VA conduction with a VA Wenckebach cycle length of 340 msec. Rapid atrial pacing was performed, which  revealed an AV Wenckebach cycle length of 280 msec.  Electroisolation  was then again confirmed in all four pulmonary veins. The procedure was therefore considered completed.  All catheters were removed, and the sheaths were aspirated and flushed.  The patient was transferred to the recovery area for sheath removal per protocol.  Intracardiac echocardiogram revealed no pericardial effusion. EBL<34m.  There were no early apparent complications. CONCLUSIONS: 1. Sinus rhythm upon presentation.  2. Successful electrical isolation and anatomical encircling of all four pulmonary veins with radiofrequency current.  A WACA approach was used 3. Additional left atrial ablation was performed with a standard box lesion created along the posterior wall of the left atrium 4.  No early apparent complications. WOcie DoyneCamnitz,MD 12:25 PM 01/27/2022   CT CARDIAC MORPH/PULM VEIN W/CM&W/O CA SCORE  Addendum Date: 01/21/2022   ADDENDUM REPORT: 01/21/2022 18:49 CLINICAL DATA:  Atrial fibrillation scheduled for ablation. EXAM: Cardiac CTA TECHNIQUE: A non-contrast, gated CT scan was obtained with axial slices of 3 mm through the heart for calcium scoring. Calcium scoring was performed using the Agatston method. A 120 kV prospective, gated, contrast cardiac scan was obtained. Gantry rotation speed was 250 msecs and collimation was 0.6 mm. Nitroglycerin was not given. A delayed scan was obtained to exclude left atrial appendage thrombus. The 3D dataset was reconstructed in 5% intervals of the 25-50% of the R-R cycle. Late systolic phases were analyzed on a dedicated workstation using MPR, MIP, and VRT modes. The patient received 80 cc of contrast. FINDINGS: Image quality: Excellent. Noise artifact is: Limited. Pulmonary Veins: There is normal pulmonary vein drainage into the left atrium (2 on the right and 2 on the left). Left Atrium: The left atrial size is normal. There is a small PFO. The left atrial appendage is large chicken  wing with two lobes. There is no thrombus in the left atrial appendage on contrast or delayed imaging. The esophagus runs in the left atrial midline and is not in proximity to any of the pulmonary vein ostia. Coronary Arteries: CAC score of 109, which is 29th percentile for age-, race-, and sex-matched controls. Normal coronary origin. Right dominance. The study was performed without use of NTG and is insufficient for plaque evaluation. Right Atrium: Right atrial size is within normal limits. Right Ventricle: The right ventricular cavity is within normal limits. Left Ventricle: The ventricular cavity size is within normal limits. Pericardium: Normal thickness without significant effusion or calcium present. Pulmonary Artery: Normal caliber without proximal filling defect. Cardiac valves: The aortic valve is trileaflet without significant calcification. The mitral valve is a normal structure without significant calcification. Aorta: Normal caliber with no significant disease. Extra-cardiac findings: See attached radiology report for non-cardiac structures. IMPRESSION: 1. There is normal pulmonary vein drainage into the left atrium. 2. There is no thrombus in the left atrial appendage. 3. The esophagus runs in the left atrial midline and is not in proximity to any of the pulmonary vein ostia. 4. Small PFO. 5. Normal coronary origin. Right dominance. CAC score of 109, which is 29th percentile for age-, race-, and sex-matched controls. WEleonore Chiquito MD Electronically Signed   By: WEleonore ChiquitoM.D.   On: 01/21/2022 18:49   Result Date: 01/21/2022 EXAM: OVER-READ INTERPRETATION  CT CHEST The following report is a limited chest CT over-read performed by radiologist Dr. LYetta Glassmanof [facility], [place] 01/21/2022. This over-read does not include interpretation of cardiac or coronary anatomy or pathology. The cardiac CTA interpretation by the cardiologist is attached. COMPARISON:  Chest CT dated December 27, 2021  FINDINGS:  Vascular: Normal heart size. No pericardial effusion. Normal caliber thoracic aorta with mild atherosclerotic disease. Mediastinum/Nodes: Esophagus is unremarkable. No pathologically enlarged lymph nodes seen in the chest. Lungs/Pleura: Central airways are patent. Fibrotic interstitial lung disease, unchanged when compared with recent CT. No consolidation, pleural effusion or pneumothorax. Small bilateral solid pulmonary nodules, stable for greater than 1 year and favored to be benign. Upper Abdomen: No acute abnormality. Musculoskeletal: Posterior fusion hardware of the thoracic spine. No acute osseous abnormality. IMPRESSION: No acute extracardiac abnormality. Electronically Signed: By: Yetta Glassman M.D. On: 01/21/2022 15:20     Assessment & Plan:   IPF (idiopathic pulmonary fibrosis) ([place]) - Diagnosed with IPF based on age and progression of ILD on recent CT imaging compared to May 2022. Probable UIP pattern. Started on Esbriet mid-July 2023. Currently on 2 tablets three times a day. Tolerating medication without GI side effects. He will increase to '801mg'$  TID next week. Continue prednisone '5mg'$  daily until follow-up, once he is on full dose of antifibrotic we will trial him off steroids. LFTs were normal in June. Repeat labs in 2-3 weeks. He will need repeat spirometry in 8-12 weeks. Advised patient to notify us if he develops any nausea, vomiting, diarrhea or weight loss. FU with Dr. Sheilah Pigeon in 6 weeks or sooner if needed.   Nocturnal hypoxia - Overnight oximetry test on 01/24/22 showed that patient spent 5 min with SPO2 less than 88%. SPO2 low 85% and baseline 93%. Patient started on 1L supplemental oxygen at bedtime.   COPD (chronic obstructive pulmonary disease) ([facility]) - Stable; Continue Stiolto Respimat 2 puffs twice daily      Martyn Ehrich, NP 02/10/2022

## 2022-02-14 DIAGNOSIS — M4635 Infection of intervertebral disc (pyogenic), thoracolumbar region: Secondary | ICD-10-CM | POA: Diagnosis not present

## 2022-02-14 DIAGNOSIS — M4325 Fusion of spine, thoracolumbar region: Secondary | ICD-10-CM | POA: Diagnosis not present

## 2022-02-14 NOTE — Telephone Encounter (Signed)
What dose is he on? Did this start when he increased to 3 pills three times a day or did that never happen? Did he try imodium?  He can stop taking. Have him see MR to discuss alternative, there is one other called Esbriet.

## 2022-02-15 ENCOUNTER — Encounter: Payer: Self-pay | Admitting: Medical

## 2022-02-16 ENCOUNTER — Telehealth: Payer: Self-pay | Admitting: Medical

## 2022-02-16 ENCOUNTER — Ambulatory Visit: Payer: Medicare Other

## 2022-02-16 DIAGNOSIS — I480 Paroxysmal atrial fibrillation: Secondary | ICD-10-CM | POA: Diagnosis not present

## 2022-02-16 DIAGNOSIS — Z7901 Long term (current) use of anticoagulants: Secondary | ICD-10-CM

## 2022-02-16 DIAGNOSIS — I4891 Unspecified atrial fibrillation: Secondary | ICD-10-CM | POA: Diagnosis not present

## 2022-02-16 LAB — POCT INR: INR: 3.1 — AB (ref 2.0–3.0)

## 2022-02-16 MED ORDER — FLUTICASONE PROPIONATE 50 MCG/ACT NA SUSP: 2.0000 | Freq: Every day | NASAL | 1 refills | Status: DC

## 2022-02-16 NOTE — Patient Instructions (Signed)
Description   Eat a serving of greens tonight and then continue taking Warfarin 1.5 tablets daily except for 1 tablet on Wednesday. Recheck INR in 3 weeks. [facility]# [phone]

## 2022-02-16 NOTE — Telephone Encounter (Signed)
Rx flonase sent to pharmacy.

## 2022-02-19 ENCOUNTER — Other Ambulatory Visit: Payer: Self-pay | Admitting: Medical

## 2022-02-21 ENCOUNTER — Other Ambulatory Visit (HOSPITAL_COMMUNITY): Payer: Self-pay

## 2022-02-21 DIAGNOSIS — R0602 Shortness of breath: Secondary | ICD-10-CM | POA: Diagnosis not present

## 2022-02-21 DIAGNOSIS — J9601 Acute respiratory failure with hypoxia: Secondary | ICD-10-CM | POA: Diagnosis not present

## 2022-02-21 MED ORDER — FAMCICLOVIR 500 MG PO TABS: 500.0000 mg | ORAL_TABLET | Freq: Three times a day (TID) | ORAL | 0 refills | Status: DC

## 2022-02-23 ENCOUNTER — Encounter: Payer: Self-pay | Admitting: Internal Medicine

## 2022-02-23 ENCOUNTER — Encounter: Payer: Self-pay | Admitting: Family Medicine

## 2022-02-23 DIAGNOSIS — L578 Other skin changes due to chronic exposure to nonionizing radiation: Secondary | ICD-10-CM

## 2022-02-23 MED ORDER — ALBUTEROL SULFATE HFA 108 (90 BASE) MCG/ACT IN AERS: INHALATION_SPRAY | RESPIRATORY_TRACT | 5 refills | Status: DC

## 2022-02-23 MED ORDER — STIOLTO RESPIMAT 2.5-2.5 MCG/ACT IN AERS: 2.0000 | INHALATION_SPRAY | Freq: Every day | RESPIRATORY_TRACT | 5 refills | Status: DC

## 2022-02-24 ENCOUNTER — Ambulatory Visit (HOSPITAL_COMMUNITY): Payer: Medicare Other | Admitting: Physician Assistant

## 2022-02-24 DIAGNOSIS — G062 Extradural and subdural abscess, unspecified: Secondary | ICD-10-CM | POA: Diagnosis not present

## 2022-02-24 DIAGNOSIS — M4325 Fusion of spine, thoracolumbar region: Secondary | ICD-10-CM | POA: Diagnosis not present

## 2022-02-24 DIAGNOSIS — M4635 Infection of intervertebral disc (pyogenic), thoracolumbar region: Secondary | ICD-10-CM | POA: Diagnosis not present

## 2022-03-07 ENCOUNTER — Encounter: Payer: Self-pay | Admitting: Medical

## 2022-03-07 MED ORDER — ZOLPIDEM TARTRATE 5 MG PO TABS
5.0000 mg | ORAL_TABLET | Freq: Every evening | ORAL | 0 refills | Status: DC | PRN
Start: 2022-03-07 — End: 2022-03-22

## 2022-03-07 NOTE — Telephone Encounter (Signed)
Requesting: Ambien '5mg'$   Contract: 01/25/2018 UDS: 07/17/21 Last Visit: 09/27/21 Next Visit: 03/15/22 w/ [name] Last Refill: 02/09/22 #30 and 0RF  Please Advise

## 2022-03-09 ENCOUNTER — Ambulatory Visit: Payer: Medicare Other

## 2022-03-09 DIAGNOSIS — Z7901 Long term (current) use of anticoagulants: Secondary | ICD-10-CM | POA: Diagnosis not present

## 2022-03-09 DIAGNOSIS — I4891 Unspecified atrial fibrillation: Secondary | ICD-10-CM | POA: Diagnosis not present

## 2022-03-09 DIAGNOSIS — I480 Paroxysmal atrial fibrillation: Secondary | ICD-10-CM

## 2022-03-09 LAB — POCT INR: INR: 2.3 (ref 2.0–3.0)

## 2022-03-09 NOTE — Patient Instructions (Signed)
Description   Continue taking Warfarin 1.5 tablets daily except for 1 tablet on Wednesday. Recheck INR in 4 weeks. [facility]# [phone]

## 2022-03-10 ENCOUNTER — Encounter: Payer: Self-pay | Admitting: Medical

## 2022-03-10 DIAGNOSIS — M4635 Infection of intervertebral disc (pyogenic), thoracolumbar region: Secondary | ICD-10-CM | POA: Diagnosis not present

## 2022-03-10 DIAGNOSIS — M4325 Fusion of spine, thoracolumbar region: Secondary | ICD-10-CM | POA: Diagnosis not present

## 2022-03-10 DIAGNOSIS — G062 Extradural and subdural abscess, unspecified: Secondary | ICD-10-CM | POA: Diagnosis not present

## 2022-03-12 ENCOUNTER — Other Ambulatory Visit: Payer: Self-pay | Admitting: Medical

## 2022-03-13 ENCOUNTER — Other Ambulatory Visit: Payer: Self-pay | Admitting: Medical

## 2022-03-14 ENCOUNTER — Other Ambulatory Visit: Payer: Self-pay | Admitting: Cardiology

## 2022-03-14 DIAGNOSIS — I480 Paroxysmal atrial fibrillation: Secondary | ICD-10-CM

## 2022-03-14 DIAGNOSIS — D2271 Melanocytic nevi of right lower limb, including hip: Secondary | ICD-10-CM | POA: Diagnosis not present

## 2022-03-14 DIAGNOSIS — L570 Actinic keratosis: Secondary | ICD-10-CM | POA: Diagnosis not present

## 2022-03-14 DIAGNOSIS — D2272 Melanocytic nevi of left lower limb, including hip: Secondary | ICD-10-CM | POA: Diagnosis not present

## 2022-03-14 DIAGNOSIS — B354 Tinea corporis: Secondary | ICD-10-CM | POA: Diagnosis not present

## 2022-03-14 DIAGNOSIS — L821 Other seborrheic keratosis: Secondary | ICD-10-CM | POA: Diagnosis not present

## 2022-03-14 DIAGNOSIS — L578 Other skin changes due to chronic exposure to nonionizing radiation: Secondary | ICD-10-CM | POA: Diagnosis not present

## 2022-03-14 DIAGNOSIS — D225 Melanocytic nevi of trunk: Secondary | ICD-10-CM | POA: Diagnosis not present

## 2022-03-14 DIAGNOSIS — B353 Tinea pedis: Secondary | ICD-10-CM | POA: Diagnosis not present

## 2022-03-14 MED ORDER — INSULIN GLARGINE 100 UNIT/ML SOLOSTAR PEN: 10.0000 [IU] | PEN_INJECTOR | Freq: Every day | SUBCUTANEOUS | 0 refills | Status: DC

## 2022-03-15 ENCOUNTER — Ambulatory Visit (INDEPENDENT_AMBULATORY_CARE_PROVIDER_SITE_OTHER): Payer: Medicare Other | Admitting: Family Medicine

## 2022-03-15 VITALS — BP 100/55 | HR 43 | Temp 98.0°F | Resp 16 | Ht 70.0 in | Wt 185.0 lb

## 2022-03-15 DIAGNOSIS — E782 Mixed hyperlipidemia: Secondary | ICD-10-CM

## 2022-03-15 DIAGNOSIS — E0865 Diabetes mellitus due to underlying condition with hyperglycemia: Secondary | ICD-10-CM | POA: Diagnosis not present

## 2022-03-15 DIAGNOSIS — Z794 Long term (current) use of insulin: Secondary | ICD-10-CM

## 2022-03-15 DIAGNOSIS — I10 Essential (primary) hypertension: Secondary | ICD-10-CM | POA: Diagnosis not present

## 2022-03-15 DIAGNOSIS — E349 Endocrine disorder, unspecified: Secondary | ICD-10-CM | POA: Diagnosis not present

## 2022-03-15 DIAGNOSIS — J84112 Idiopathic pulmonary fibrosis: Secondary | ICD-10-CM

## 2022-03-15 DIAGNOSIS — J189 Pneumonia, unspecified organism: Secondary | ICD-10-CM | POA: Diagnosis not present

## 2022-03-15 DIAGNOSIS — I480 Paroxysmal atrial fibrillation: Secondary | ICD-10-CM

## 2022-03-15 LAB — CBC
HCT: 35.5 % — ABNORMAL LOW (ref 39.0–52.0)
Hemoglobin: 12.1 g/dL — ABNORMAL LOW (ref 13.0–17.0)
MCHC: 34.1 g/dL (ref 30.0–36.0)
MCV: 88.4 fl (ref 78.0–100.0)
Platelets: 342 10*3/uL (ref 150.0–400.0)
RBC: 4.02 Mil/uL — ABNORMAL LOW (ref 4.22–5.81)
RDW: 16.3 % — ABNORMAL HIGH (ref 11.5–15.5)
WBC: 8.2 10*3/uL (ref 4.0–10.5)

## 2022-03-15 LAB — TSH: TSH: 2.16 u[IU]/mL (ref 0.35–5.50)

## 2022-03-15 LAB — HEMOGLOBIN A1C: Hgb A1c MFr Bld: 9 % — ABNORMAL HIGH (ref 4.6–6.5)

## 2022-03-15 NOTE — Assessment & Plan Note (Signed)
Well controlled, no changes to meds. Encouraged heart healthy diet such as the DASH diet and exercise as tolerated.  °

## 2022-03-15 NOTE — Assessment & Plan Note (Addendum)
hgba1c high at last check recheck today, minimize simple carbs. Increase exercise as tolerated. Continue current meds

## 2022-03-15 NOTE — Patient Instructions (Addendum)
Encouraged increased hydration and fiber in diet. Daily probiotics. If bowels not moving can use Magnesia Of Magnesia 2 tbls po in 4 oz of warm prune juice by mouth every 2-3 days. If no results then repeat in 4 hours with  Dulcolax suppository pr, may repeat again in 4 more hours as needed. Seek care if symptoms worsen. Consider daily Miralax and/or Dulcolax if symptoms persist.  High fiber diet, hydrate with 60-80 ounces of fluids daily Mix Miralax with Benefiber once to twice day for the constipation if still struggling then add 1-2 Senna S tabs daily  Magnesium Glycinate 200-400 mg at bedtime  L Tryptophan capsules for sleep  RSV (Respiratory Syncitial Virus)  vaccine at pharmacy  High   For protein consider Yellow Split Pea Powder, Whey powder check the K content, Collagen Powder, chia seeds, Flaxseeds    Encouraged good sleep hygiene such as dark, quiet room. No blue/green glowing lights such as computer screens in bedroom. No alcohol or stimulants in evening. Cut down on caffeine as able. Regular exercise is helpful but not just prior to bed time.  Insomnia Insomnia is a sleep disorder that makes it difficult to fall asleep or stay asleep. Insomnia can cause fatigue, low energy, difficulty concentrating, mood swings, and poor performance at work or school. There are three different ways to classify insomnia: Difficulty falling asleep. Difficulty staying asleep. Waking up too early in the morning. Any type of insomnia can be long-term (chronic) or short-term (acute). Both are common. Short-term insomnia usually lasts for 3 months or less. Chronic insomnia occurs at least three times a week for longer than 3 months. What are the causes? Insomnia may be caused by another condition, situation, or substance, such as: Having certain mental health conditions, such as anxiety and depression. Using caffeine, alcohol, tobacco, or drugs. Having gastrointestinal conditions, such as  gastroesophageal reflux disease (GERD). Having certain medical conditions. These include: Asthma. Alzheimer's disease. Stroke. Chronic pain. An overactive thyroid gland (hyperthyroidism). Other sleep disorders, such as restless legs syndrome and sleep apnea. Menopause. Sometimes, the cause of insomnia may not be known. What increases the risk? Risk factors for insomnia include: Gender. Females are affected more often than males. Age. Insomnia is more common as people get older. Stress and certain medical and mental health conditions. Lack of exercise. Having an irregular work schedule. This may include working night shifts and traveling between different time zones. What are the signs or symptoms? If you have insomnia, the main symptom is having trouble falling asleep or having trouble staying asleep. This may lead to other symptoms, such as: Feeling tired or having low energy. Feeling nervous about going to sleep. Not feeling rested in the morning. Having trouble concentrating. Feeling irritable, anxious, or depressed. How is this diagnosed? This condition may be diagnosed based on: Your symptoms and medical history. Your health care provider may ask about: Your sleep habits. Any medical conditions you have. Your mental health. A physical exam. How is this treated? Treatment for insomnia depends on the cause. Treatment may focus on treating an underlying condition that is causing the insomnia. Treatment may also include: Medicines to help you sleep. Counseling or therapy. Lifestyle adjustments to help you sleep better. Follow these instructions at home: Eating and drinking  Limit or avoid alcohol, caffeinated beverages, and products that contain nicotine and tobacco, especially close to bedtime. These can disrupt your sleep. Do not eat a large meal or eat spicy foods right before bedtime. This can lead to  digestive discomfort that can make it hard for you to sleep. Sleep  habits  Keep a sleep diary to help you and your health care provider figure out what could be causing your insomnia. Write down: When you sleep. When you wake up during the night. How well you sleep and how rested you feel the next day. Any side effects of medicines you are taking. What you eat and drink. Make your bedroom a dark, comfortable place where it is easy to fall asleep. Put up shades or blackout curtains to block light from outside. Use a white noise machine to block noise. Keep the temperature cool. Limit screen use before bedtime. This includes: Not watching TV. Not using your smartphone, tablet, or computer. Stick to a routine that includes going to bed and waking up at the same times every day and night. This can help you fall asleep faster. Consider making a quiet activity, such as reading, part of your nighttime routine. Try to avoid taking naps during the day so that you sleep better at night. Get out of bed if you are still awake after 15 minutes of trying to sleep. Keep the lights down, but try reading or doing a quiet activity. When you feel sleepy, go back to bed. General instructions Take over-the-counter and prescription medicines only as told by your health care provider. Exercise regularly as told by your health care provider. However, avoid exercising in the hours right before bedtime. Use relaxation techniques to manage stress. Ask your health care provider to suggest some techniques that may work well for you. These may include: Breathing exercises. Routines to release muscle tension. Visualizing peaceful scenes. Make sure that you drive carefully. Do not drive if you feel very sleepy. Keep all follow-up visits. This is important. Contact a health care provider if: You are tired throughout the day. You have trouble in your daily routine due to sleepiness. You continue to have sleep problems, or your sleep problems get worse. Get help right away if: You  have thoughts about hurting yourself or someone else. Get help right away if you feel like you may hurt yourself or others, or have thoughts about taking your own life. Go to your nearest emergency room or: Call 911. Call the [facility] at [phone] or 988. This is open 24 hours a day. Text the Crisis Text Line at (340) 417-7428. Summary Insomnia is a sleep disorder that makes it difficult to fall asleep or stay asleep. Insomnia can be long-term (chronic) or short-term (acute). Treatment for insomnia depends on the cause. Treatment may focus on treating an underlying condition that is causing the insomnia. Keep a sleep diary to help you and your health care provider figure out what could be causing your insomnia. This information is not intended to replace advice given to you by your health care provider. Make sure you discuss any questions you have with your health care provider. Document Revised: 06/14/2021 Document Reviewed: 06/14/2021 Elsevier Patient Education  [place].

## 2022-03-15 NOTE — Assessment & Plan Note (Signed)
>>  ASSESSMENT AND PLAN FOR TESTOSTERONE DEFICIENCY WRITTEN ON 03/15/2022  1:04 PM BY BLYTH, STACEY A, MD  He is advised that due to his increasing age and heart disease that if he chooses to pursue testosterone treatments he will need a referral to urology

## 2022-03-15 NOTE — Assessment & Plan Note (Signed)
He is advised that due to his increasing age and heart disease that if he chooses to pursue testosterone treatments he will need a referral to urology

## 2022-03-15 NOTE — Progress Notes (Signed)
Subjective:   By signing my name below, I, [name], attest that this documentation has been prepared under the direction and in the presence of [name], MD 03/15/2022.     [name], male    DOB: 07-25-1945, 76 y.o.   MRN: [id number]  Chief Complaint  Patient presents with   Insomnia    Here to discuss sleep problems   HPI Patient is in today for an office visit.  Insomnia: He complains of difficulty sleeping. He states that he was prescribed Zolpidem 5 mg by Dr. Lorelei Pont. He states he has been taking Melatonin gummies to help him sleep.   Constipation: He complains of constipation and states that he sometimes does not have a bowel movement for 4 days. He states he has been taking probiotics.   Atrial fibrillation ablation: He had an ablation performed on 01/27/2022 by his cardiologist, Dr. Curt Bears, and returned home the same day.   Pulmonology: He has an appointment on 03/24/2022 with his pulmonologist Dr. Volanda Napoleon.   Immunizations: He has been informed about taking the high-dose Flu and RSV vaccinations.   Testosterone: He is inquiring about taking Testosterone but was advised not to by his pulmonologist Dr. Chase Caller.   Exercise: He intends to start exercising.   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   Esophageal reflux 02/10/2015   Fatigue    HTN (hypertension)    Hyperglycemia    preDM    Hyperlipidemia    Insomnia    Low back pain radiating to both legs 01/16/2017   Measles as a child    Overweight(278.02)    Peripheral neuropathy 08/07/2019   Personal history of colonic polyps 10/27/2012   Follows with [facility]   Pneumonia    " walking"   Preventative health care    Testosterone deficiency 05/23/2012   Wears glasses    Past Surgical History:  Procedure Laterality Date   ATRIAL FIBRILLATION ABLATION N/A 01/27/2022   Procedure: ATRIAL FIBRILLATION ABLATION;  Surgeon: Constance Haw, MD;  Location: [facility];  Service: Cardiovascular;  Laterality: N/A;   BACK SURGERY  2012 and 1994   Dr Margret Chance, screws and cage in low back   BIOPSY  01/09/2020   Procedure: BIOPSY;  Surgeon: Otis Brace, MD;  Location: [facility];  Service: Gastroenterology;;   BRONCHIAL BIOPSY  11/17/2020   Procedure: BRONCHIAL BIOPSIES;  Surgeon: Laurin Coder, MD;  Location: [facility];  Service: Endoscopy;;   BRONCHIAL BRUSHINGS  11/17/2020   Procedure: BRONCHIAL BRUSHINGS;  Surgeon: Laurin Coder, MD;  Location: [facility];  Service: Endoscopy;;   BRONCHIAL WASHINGS  11/17/2020   Procedure: BRONCHIAL WASHINGS;  Surgeon: Laurin Coder, MD;  Location: [facility];  Service: Endoscopy;;   COLONOSCOPY WITH PROPOFOL N/A 01/09/2020   Procedure: COLONOSCOPY WITH PROPOFOL;  Surgeon: Otis Brace, MD;  Location: [facility];  Service: Gastroenterology;  Laterality: N/A;   ESOPHAGOGASTRODUODENOSCOPY (EGD) WITH PROPOFOL N/A 01/09/2020  Procedure: ESOPHAGOGASTRODUODENOSCOPY (EGD) WITH PROPOFOL;  Surgeon: Kathi Der, MD;  Location: [facility];  Service: Gastroenterology;  Laterality: N/A;   EYE SURGERY Bilateral    2016   HARDWARE REVISION  03/12/2019   REVISION OF HARDWARE THORACIC TEN-THORACIC ELEVEN WITH APPLICATION OF ADDITIONAL RODS AND ROD SLEEVES THORACIC EIGHT-THORACIC TWELVE 12-LUMBAR ONE (N/A )   HEMOSTASIS CONTROL  11/17/2020   Procedure: HEMOSTASIS CONTROL;  Surgeon: Tomma Lightning, MD;  Location: [facility];  Service: Endoscopy;;   IR  US GUIDE BX ASP/DRAIN  11/13/2020   LAMINECTOMY  02/12/2019   DECOMPRESSIVE LAMINECTOMY THORACIC NINE-THORACIC TEN AND THORACIC TEN-THORACIC ELEVEN, EXTENSION OF THORACOLUMBAR FUSION FROM THORACIC TEN TO THORACIC FIVE, LOCAL BONE GRAFT, ALLOGRAFT AND VIVIGEN (N/A)   POLYPECTOMY  01/09/2020   Procedure: POLYPECTOMY;  Surgeon: Kathi Der, MD;  Location: [facility];  Service: Gastroenterology;;   REMOVAL OF RODS AND PEDICLE SCREWS T5 AND T6, EXPLORATION OF FUSION, BIOPSY TRANSPEDICULAR T5 AND T6 (N/A )  09/14/2020   SPINAL FUSION N/A 02/12/2019   Procedure: DECOMPRESSIVE LAMINECTOMY THORACIC NINE-THORACIC TEN AND THORACIC TEN-THORACIC ELEVEN, EXTENSION OF THORACOLUMBAR FUSION FROM THORACIC TEN TO THORACIC FIVE, LOCAL BONE GRAFT, ALLOGRAFT AND VIVIGEN;  Surgeon: Kerrin Champagne, MD;  Location: [facility];  Service: Orthopedics;  Laterality: N/A;  DECOMPRESSIVE LAMINECTOMY THORACIC NINE-THORACIC TEN AND THORACIC TEN-THORACIC ELEVEN, EXTENSION OF TH   SPINAL FUSION N/A 03/12/2019   Procedure: REVISION OF HARDWARE THORACIC TEN-THORACIC ELEVEN WITH APPLICATION OF ADDITIONAL RODS AND ROD SLEEVES THORACIC EIGHT-THORACIC TWELVE 12-LUMBAR ONE;  Surgeon: Kerrin Champagne, MD;  Location: [facility];  Service: Orthopedics;  Laterality: N/A;   TONSILLECTOMY     as child   torn rotator cuff  2010   right   VIDEO BRONCHOSCOPY N/A 11/17/2020   Procedure: VIDEO BRONCHOSCOPY WITH FLUORO;  Surgeon: Tomma Lightning, MD;  Location: [facility];  Service: Endoscopy;  Laterality: N/A;   Family History  Problem Relation Age of Onset   Hypertension Mother    Diabetes Mother        type 2   Cancer Mother 62       breast in remission   Emphysema Father        smoker   COPD Father        smoker   Stroke Father 25       mini   Heart disease Father    Hypertension Sister    Hyperlipidemia Sister    Scoliosis Sister    Osteoporosis Sister    Hypertension Maternal Grandmother    Scoliosis Maternal Grandmother    Heart  disease Maternal Grandfather    Heart disease Paternal Grandfather        smoker   Heart disease Daughter    Social History   Socioeconomic History   Marital status: Married    Spouse name: Not on file   Number of children: Not on file   Years of education: Not on file   Highest education level: Not on file  Occupational History   Not on file  Tobacco Use   Smoking status: Former    Packs/day: 1.00    Years: 20.00    Total pack years: 20.00    Types: Cigarettes    Quit date: 07/18/1989    Years since quitting: 32.6   Smokeless tobacco: Never  Vaping Use   Vaping Use: Never used  Substance and Sexual Activity   Alcohol use: Not Currently   Drug use: No   Sexual activity: Yes  Comment: lives with wife, still working, no dietary restrictions, continues to exercise intermittently  Other Topics Concern   Not on file  Social History Narrative   Not on file   Social Determinants of Health   Financial Resource Strain: Low Risk  (01/27/2021)   Overall Financial Resource Strain (CARDIA)    Difficulty of Paying Living Expenses: Not hard at all  Food Insecurity: No Food Insecurity (01/27/2021)   Hunger Vital Sign    Worried About Running Out of Food in the Last Year: Never true    [place] in the Last Year: Never true  Transportation Needs: No Transportation Needs (01/27/2021)   PRAPARE - Hydrologist (Medical): No    Lack of Transportation (Non-Medical): No  Physical Activity: Sufficiently Active (01/27/2021)   Exercise Vital Sign    Days of Exercise per Week: 7 days    Minutes of Exercise per Session: 30 min  Stress: No Stress Concern Present (01/27/2021)   Falls    Feeling of Stress : Not at all  Social Connections: Moderately Integrated (01/27/2021)   Social Connection and Isolation Panel [NHANES]    Frequency of Communication with Friends and Family: More than three  times a week    Frequency of Social Gatherings with Friends and Family: More than three times a week    Attends Religious Services: More than 4 times per year    Active Member of [employer] or Organizations: No    Attends Archivist Meetings: Never    Marital Status: Married  Human resources officer Violence: Not At Risk (01/27/2021)   Humiliation, Afraid, Rape, and Kick questionnaire    Fear of Current or Ex-Partner: No    Emotionally Abused: No    Physically Abused: No    Sexually Abused: No   Outpatient Medications Prior to Visit  Medication Sig Dispense Refill   albuterol (PROVENTIL HFA) 108 (90 Base) MCG/ACT inhaler TAKE 2 PUFFS BY MOUTH EVERY 6 HOURS AS NEEDED FOR WHEEZE OR SHORTNESS OF BREATH 8 g 5   atorvastatin (LIPITOR) 10 MG tablet TAKE 1 TABLET BY MOUTH EVERY DAY 90 tablet 1   DALVANCE 500 MG SOLR Inject 500 mg into the vein every 14 (fourteen) days. Pt states this is a every 2 week antibiotic. Could not state how long therapy should be.     dextromethorphan-guaiFENesin (MUCINEX DM) 30-600 MG 12hr tablet Take 1 tablet by mouth 2 (two) times daily.     digoxin (LANOXIN) 0.125 MG tablet TAKE 1 TABLET BY MOUTH EVERY DAY 90 tablet 1   famciclovir (FAMVIR) 500 MG tablet Take 1 tablet (500 mg total) by mouth 3 (three) times daily. 21 tablet 0   fluticasone (FLONASE) 50 MCG/ACT nasal spray SPRAY 2 SPRAYS INTO EACH NOSTRIL EVERY DAY 16 mL 1   glucose blood (CONTOUR NEXT TEST) test strip Check blood sugar once daily 100 each 12   insulin glargine (LANTUS) 100 UNIT/ML Solostar Pen Inject 10 Units into the skin daily. 15 mL 0   Insulin Pen Needle (B-D UF III MINI PEN NEEDLES) 31G X 5 MM MISC USE WITH LANTUS 100 each 0   metFORMIN (GLUCOPHAGE) 1000 MG tablet TAKE 1 TABLET (1,000 MG TOTAL) BY MOUTH TWICE A DAY WITH FOOD 180 tablet 0   oxyCODONE-acetaminophen (PERCOCET/ROXICET) 5-325 MG tablet Take 1 tablet by mouth every 4 (four) hours as needed for moderate pain.     pantoprazole (PROTONIX)  40 MG tablet TAKE 1 TABLET BY MOUTH TWICE A DAY BEFORE A MEAL 180 tablet 1   predniSONE (DELTASONE) 10 MG tablet TAKE 1 TABLET (10 MG TOTAL) BY MOUTH DAILY WITH BREAKFAST. (Patient taking differently: Take 10 mg by mouth daily with breakfast. Taking 1/2 tablet daily) 30 tablet 3   pregabalin (LYRICA) 75 MG capsule TAKE 1 CAPSULE BY MOUTH TWICE A DAY (Patient taking differently: Take 75 mg by mouth 2 (two) times daily.) 60 capsule 3   sitaGLIPtin (JANUVIA) 50 MG tablet Take 1 tablet (50 mg total) by mouth daily. 30 tablet 3   STIOLTO RESPIMAT 2.5-2.5 MCG/ACT AERS Inhale 2 puffs into the lungs daily. 1 each 5   tamsulosin (FLOMAX) 0.4 MG CAPS capsule Take 2 capsules (0.8 mg total) by mouth daily. (Patient taking differently: Take 0.8 mg by mouth at bedtime.) 180 capsule 3   tiZANidine (ZANAFLEX) 4 MG tablet Take 1 tablet (4 mg total) by mouth every 6 (six) hours as needed. (Patient taking differently: Take 4 mg by mouth every 6 (six) hours as needed for muscle spasms.) 120 tablet 3   venlafaxine XR (EFFEXOR-XR) 150 MG 24 hr capsule Take 1 capsule (150 mg total) by mouth daily with breakfast. 90 capsule 0   warfarin (COUMADIN) 5 MG tablet TAKE 1 TO 2 TABLETS DAILY OR AS PRESCRIBED BY COUMADIN CLINIC 130 tablet 0   zolpidem (AMBIEN) 5 MG tablet Take 1 tablet (5 mg total) by mouth at bedtime as needed. for sleep 30 tablet 0   colchicine 0.6 MG tablet Take 1 tablet (0.6 mg total) by mouth 2 (two) times daily for 14 days. 28 tablet 0   Pirfenidone 267 MG TABS 1 tablet three times daily for 7 days, then 2 tablets three times daily for 7 days, then 3 tablets three times daily. 207 tablet 0   Pirfenidone 267 MG TABS Take 3 tablets (801 mg total) by mouth with breakfast, with lunch, and with evening meal. MAINTENANCE RX 270 tablet 1   Facility-Administered Medications Prior to Visit  Medication Dose Route Frequency Provider Last Rate Last Admin   testosterone cypionate (DEPOTESTOSTERONE CYPIONATE) injection 200  mg  200 mg Intramuscular Q14 Days Saguier, Percell Miller, PA-C   200 mg at 07/06/21 6629   Allergies  Allergen Reactions   Daptomycin     Drug induced pneumonia    Diltiazem Other (See Comments)    Passed out   ROS     Objective:    Physical Exam Constitutional:      General: He is not in acute distress.    Appearance: Normal appearance. He is not ill-appearing.  HENT:     Head: Normocephalic and atraumatic.     Right Ear: External ear normal.     Left Ear: External ear normal.     Mouth/Throat:     Mouth: Mucous membranes are moist.     Pharynx: Oropharynx is clear.  Eyes:     Extraocular Movements: Extraocular movements intact.     Pupils: Pupils are equal, round, and reactive to light.  Cardiovascular:     Rate and Rhythm: Normal rate and regular rhythm.     Pulses: Normal pulses.     Heart sounds: Normal heart sounds. No murmur heard.    No gallop.  Pulmonary:     Effort: Pulmonary effort is normal. No respiratory distress.     Breath sounds: Normal breath sounds. No wheezing or rales.  Abdominal:     General: Bowel sounds are normal.  Skin:    General: Skin is warm and dry.  Neurological:     Mental Status: He is alert and oriented to person, place, and time.  Psychiatric:        Mood and Affect: Mood normal.        Behavior: Behavior normal.        Judgment: Judgment normal.    BP (!) 100/55 (BP Location: Right Arm, Patient Position: Sitting, Cuff Size: Normal)   Pulse (!) 43   Temp 98 F (36.7 C) (Oral)   Resp 16   Ht $R'5\' 10"'Af$  (1.778 m)   Wt 185 lb (83.9 kg)   SpO2 98%   BMI 26.54 kg/m  Wt Readings from Last 3 Encounters:  03/15/22 185 lb (83.9 kg)  02/10/22 186 lb 9.6 oz (84.6 kg)  01/27/22 175 lb (79.4 kg)   Diabetic Foot Exam - Simple   No data filed    Lab Results  Component Value Date   WBC 5.4 01/27/2022   HGB 12.1 (L) 01/27/2022   HCT 35.9 (L) 01/27/2022   PLT 271 01/27/2022   GLUCOSE 191 (H) 01/25/2022   CHOL 112 07/01/2020   TRIG 66.0  07/01/2020   HDL 56.10 07/01/2020   LDLDIRECT 64.0 10/30/2017   LDLCALC 42 07/01/2020   ALT 14 12/30/2021   AST 22 12/30/2021   NA 134 01/25/2022   K 4.5 01/25/2022   CL 96 01/25/2022   CREATININE 0.76 01/25/2022   BUN 10 01/25/2022   CO2 20 01/25/2022   TSH 1.816 07/16/2021   PSA 0.65 10/08/2019   INR 2.3 03/09/2022   HGBA1C 10.2 (H) 08/16/2021   MICROALBUR 0.61 04/23/2012   Lab Results  Component Value Date   TSH 1.816 07/16/2021   Lab Results  Component Value Date   WBC 5.4 01/27/2022   HGB 12.1 (L) 01/27/2022   HCT 35.9 (L) 01/27/2022   MCV 87.1 01/27/2022   PLT 271 01/27/2022   Lab Results  Component Value Date   NA 134 01/25/2022   K 4.5 01/25/2022   CO2 20 01/25/2022   GLUCOSE 191 (H) 01/25/2022   BUN 10 01/25/2022   CREATININE 0.76 01/25/2022   BILITOT 0.7 12/30/2021   ALKPHOS 65 12/30/2021   AST 22 12/30/2021   ALT 14 12/30/2021   PROT 6.4 12/30/2021   ALBUMIN 4.3 12/30/2021   CALCIUM 9.4 01/25/2022   ANIONGAP 9 10/05/2021   EGFR 92 01/25/2022   GFR 87.11 08/16/2021   Lab Results  Component Value Date   CHOL 112 07/01/2020   Lab Results  Component Value Date   HDL 56.10 07/01/2020   Lab Results  Component Value Date   LDLCALC 42 07/01/2020   Lab Results  Component Value Date   TRIG 66.0 07/01/2020   Lab Results  Component Value Date   CHOLHDL 2 07/01/2020   Lab Results  Component Value Date   HGBA1C 10.2 (H) 08/16/2021      Assessment & Plan:   Problem List Items Addressed This Visit     Essential hypertension - Primary    Well controlled, no changes to meds. Encouraged heart healthy diet such as the DASH diet and exercise as tolerated.       Relevant Orders   CBC   Comprehensive metabolic panel   TSH   Diabetes mellitus with hyperglycemia ([place])    hgba1c high at last check recheck today, minimize simple carbs. Increase exercise as tolerated. Continue current meds      Relevant  Orders   Hemoglobin A1c   Testosterone  deficiency    He is advised that due to his increasing age and heart disease that if he chooses to pursue testosterone treatments he will need a referral to urology      Paroxysmal A-fib [facility])    Has just undergone an ablation with cardiology and is doing well. RRR today. Tolerating Coumadin and has follow up with cardiology soon      Mixed hyperlipidemia   Relevant Orders   Lipid panel   IPF (idiopathic pulmonary fibrosis) ([place])    Is following with pulmonology and did not tolerate his most recent treatment. He will follow up with pulm shortly to discuss further options      RESOLVED: CAP (community acquired pneumonia)   No orders of the defined types were placed in this encounter.  I, Penni Homans, MD, personally preformed the services described in this documentation.  All medical record entries made by the scribe were at my direction and in my presence.  I have reviewed the chart and discharge instructions (if applicable) and agree that the record reflects my personal performance and is accurate and complete. 03/15/2022  I,Mohammed Iqbal,acting as a scribe for Penni Homans, MD.,have documented all relevant documentation on the behalf of Penni Homans, MD,as directed by  Penni Homans, MD while in the presence of Penni Homans, MD.  Penni Homans, MD

## 2022-03-15 NOTE — Assessment & Plan Note (Signed)
Has just undergone an ablation with cardiology and is doing well. RRR today. Tolerating Coumadin and has follow up with cardiology soon

## 2022-03-15 NOTE — Assessment & Plan Note (Signed)
Is following with pulmonology and did not tolerate his most recent treatment. He will follow up with pulm shortly to discuss further options

## 2022-03-16 ENCOUNTER — Encounter (HOSPITAL_COMMUNITY): Payer: Self-pay | Admitting: Physician Assistant

## 2022-03-16 ENCOUNTER — Other Ambulatory Visit: Payer: Self-pay

## 2022-03-16 ENCOUNTER — Ambulatory Visit (HOSPITAL_COMMUNITY)
Admission: RE | Admit: 2022-03-16 | Discharge: 2022-03-16 | Disposition: A | Discharge status: Home / Self Care | Payer: Medicare Other | Source: Ambulatory Visit | Attending: Nurse Practitioner | Admitting: Nurse Practitioner

## 2022-03-16 VITALS — BP 122/78 | HR 122 | Ht 70.0 in | Wt 184.0 lb

## 2022-03-16 DIAGNOSIS — D6869 Other thrombophilia: Secondary | ICD-10-CM

## 2022-03-16 DIAGNOSIS — Z7901 Long term (current) use of anticoagulants: Secondary | ICD-10-CM | POA: Insufficient documentation

## 2022-03-16 DIAGNOSIS — I480 Paroxysmal atrial fibrillation: Secondary | ICD-10-CM

## 2022-03-16 DIAGNOSIS — Z7984 Long term (current) use of oral hypoglycemic drugs: Secondary | ICD-10-CM | POA: Insufficient documentation

## 2022-03-16 DIAGNOSIS — Z794 Long term (current) use of insulin: Secondary | ICD-10-CM | POA: Insufficient documentation

## 2022-03-16 DIAGNOSIS — E118 Type 2 diabetes mellitus with unspecified complications: Secondary | ICD-10-CM | POA: Insufficient documentation

## 2022-03-16 DIAGNOSIS — I10 Essential (primary) hypertension: Secondary | ICD-10-CM

## 2022-03-16 DIAGNOSIS — R000 Tachycardia, unspecified: Secondary | ICD-10-CM | POA: Diagnosis not present

## 2022-03-16 DIAGNOSIS — E785 Hyperlipidemia, unspecified: Secondary | ICD-10-CM | POA: Insufficient documentation

## 2022-03-16 DIAGNOSIS — Z79899 Other long term (current) drug therapy: Secondary | ICD-10-CM | POA: Insufficient documentation

## 2022-03-16 LAB — COMPREHENSIVE METABOLIC PANEL
ALT: 13 U/L (ref 0–53)
AST: 21 U/L (ref 0–37)
Albumin: 4.2 g/dL (ref 3.5–5.2)
Alkaline Phosphatase: 93 U/L (ref 39–117)
BUN: 8 mg/dL (ref 6–23)
CO2: 27 mEq/L (ref 19–32)
Calcium: 9.1 mg/dL (ref 8.4–10.5)
Chloride: 93 mEq/L — ABNORMAL LOW (ref 96–112)
Creatinine, Ser: 0.76 mg/dL (ref 0.40–1.50)
GFR: 86.06 mL/min (ref >60.00)
Glucose, Bld: 267 mg/dL — ABNORMAL HIGH (ref 70–99)
Potassium: 4.6 mEq/L (ref 3.5–5.1)
Sodium: 130 mEq/L — ABNORMAL LOW (ref 135–145)
Total Bilirubin: 0.9 mg/dL (ref 0.2–1.2)
Total Protein: 6.1 g/dL (ref 6.0–8.3)

## 2022-03-16 LAB — LIPID PANEL
Cholesterol: 171 mg/dL (ref 0–200)
HDL: 79.8 mg/dL (ref >39.00)
LDL Cholesterol: 70 mg/dL (ref 0–99)
NonHDL: 91.29
Total CHOL/HDL Ratio: 2
Triglycerides: 104 mg/dL (ref 0.0–149.0)
VLDL: 20.8 mg/dL (ref 0.0–40.0)

## 2022-03-16 NOTE — Progress Notes (Signed)
Primary Care Physician: Mackie Pai, PA-C Primary Cardiologist: Dr Harriet Masson Primary Electrophysiologist: Dr Curt Bears  Referring Physician: Dr Bertis Ruddy is a 76 y.o. male with a history of HTN, DM, HLD, atrial fibrillation who presents for follow up in the [facility]. Patient wore a cardiac monitor 01/2021 which showed 3% afib burden. Patient is on warfarin for a CHADS2VASC score of 4. He underwent afib ablation with Dr Curt Bears on 01/27/22.  On follow up today, patient reports that he has not had any symptoms of tachypalpitations since his ablation. He denies CP, swallowing pain, or groin issues. He states his breathing is better now than it has been for quite some time. No bleeding issues on anticoagulation. He had TSH, CBC, CMET, A1c with his PCP yesterday.   Today, he denies symptoms of palpitations, chest pain, shortness of breath, orthopnea, PND, lower extremity edema, dizziness, presyncope, syncope, snoring, daytime somnolence, bleeding, or neurologic sequela. The patient is tolerating medications without difficulties and is otherwise without complaint today.    Atrial Fibrillation Risk Factors:  he does not have symptoms or diagnosis of sleep apnea. he does not have a history of rheumatic fever.   he has a BMI of Body mass index is 26.4 kg/m.Marland Kitchen Filed Weights   03/16/22 1508  Weight: 83.5 kg    Family History  Problem Relation Age of Onset   Hypertension Mother    Diabetes Mother        type 2   Cancer Mother 48       breast in remission   Emphysema Father        smoker   COPD Father        smoker   Stroke Father 29       mini   Heart disease Father    Hypertension Sister    Hyperlipidemia Sister    Scoliosis Sister    Osteoporosis Sister    Hypertension Maternal Grandmother    Scoliosis Maternal Grandmother    Heart disease Maternal Grandfather    Heart disease Paternal Grandfather        smoker   Heart disease  Daughter      Atrial Fibrillation Management history:  Previous antiarrhythmic drugs: none Previous cardioversions: none Previous ablations: 01/27/22 CHADS2VASC score: 4 Anticoagulation history: warfarin    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   Esophageal reflux 02/10/2015   Fatigue    HTN (hypertension)    Hyperglycemia    preDM    Hyperlipidemia    Insomnia    Low back pain radiating to both legs 01/16/2017   Measles as a child   Overweight(278.02)    Peripheral neuropathy 08/07/2019   Personal history of colonic polyps 10/27/2012   Follows with [facility]   Pneumonia    " walking"   Preventative health care    Testosterone deficiency 05/23/2012   Wears glasses    Past Surgical History:  Procedure Laterality Date  ATRIAL FIBRILLATION ABLATION N/A 01/27/2022   Procedure: ATRIAL FIBRILLATION ABLATION;  Surgeon: Constance Haw, MD;  Location: [place] [facility] [facility];  Service: Cardiovascular;  Laterality: N/A;   BACK SURGERY  2012 and 1994   Dr Margret Chance, screws and cage in low back   BIOPSY  01/09/2020   Procedure: BIOPSY;  Surgeon: Otis Brace, MD;  Location: [facility];  Service: Gastroenterology;;   BRONCHIAL BIOPSY  11/17/2020   Procedure: BRONCHIAL BIOPSIES;  Surgeon: Laurin Coder, MD;  Location: [facility];  Service: Endoscopy;;   BRONCHIAL BRUSHINGS  11/17/2020   Procedure: BRONCHIAL BRUSHINGS;  Surgeon: Laurin Coder, MD;  Location: [facility];  Service: Endoscopy;;   BRONCHIAL WASHINGS   11/17/2020   Procedure: BRONCHIAL WASHINGS;  Surgeon: Laurin Coder, MD;  Location: [facility];  Service: Endoscopy;;   COLONOSCOPY WITH PROPOFOL N/A 01/09/2020   Procedure: COLONOSCOPY WITH PROPOFOL;  Surgeon: Otis Brace, MD;  Location: [facility];  Service: Gastroenterology;  Laterality: N/A;   ESOPHAGOGASTRODUODENOSCOPY (EGD) WITH PROPOFOL N/A 01/09/2020   Procedure: ESOPHAGOGASTRODUODENOSCOPY (EGD) WITH PROPOFOL;  Surgeon: Otis Brace, MD;  Location: [facility];  Service: Gastroenterology;  Laterality: N/A;   EYE SURGERY Bilateral    2016   HARDWARE REVISION  03/12/2019   REVISION OF HARDWARE THORACIC TEN-THORACIC ELEVEN WITH APPLICATION OF ADDITIONAL RODS AND ROD SLEEVES THORACIC EIGHT-THORACIC TWELVE 12-LUMBAR ONE (N/A )   HEMOSTASIS CONTROL  11/17/2020   Procedure: HEMOSTASIS CONTROL;  Surgeon: Laurin Coder, MD;  Location: [facility];  Service: Endoscopy;;   IR US GUIDE BX ASP/DRAIN  11/13/2020   LAMINECTOMY  02/12/2019   DECOMPRESSIVE LAMINECTOMY THORACIC NINE-THORACIC TEN AND THORACIC TEN-THORACIC ELEVEN, EXTENSION OF THORACOLUMBAR FUSION FROM THORACIC TEN TO THORACIC FIVE, LOCAL BONE GRAFT, ALLOGRAFT AND VIVIGEN (N/A)   POLYPECTOMY  01/09/2020   Procedure: POLYPECTOMY;  Surgeon: Otis Brace, MD;  Location: [facility];  Service: Gastroenterology;;   REMOVAL OF RODS AND PEDICLE SCREWS T5 AND T6, EXPLORATION OF FUSION, BIOPSY TRANSPEDICULAR T5 AND T6 (N/A )  09/14/2020   SPINAL FUSION N/A 02/12/2019   Procedure: DECOMPRESSIVE LAMINECTOMY THORACIC NINE-THORACIC TEN AND THORACIC TEN-THORACIC ELEVEN, EXTENSION OF THORACOLUMBAR FUSION FROM THORACIC TEN TO THORACIC FIVE, LOCAL BONE GRAFT, ALLOGRAFT AND VIVIGEN;  Surgeon: Jessy Oto, MD;  Location: [facility];  Service: Orthopedics;  Laterality: N/A;  DECOMPRESSIVE LAMINECTOMY THORACIC NINE-THORACIC TEN AND THORACIC TEN-THORACIC ELEVEN, EXTENSION OF TH   SPINAL FUSION N/A 03/12/2019   Procedure: REVISION OF HARDWARE  THORACIC TEN-THORACIC ELEVEN WITH APPLICATION OF ADDITIONAL RODS AND ROD SLEEVES THORACIC EIGHT-THORACIC TWELVE 12-LUMBAR ONE;  Surgeon: Jessy Oto, MD;  Location[facility];  Service: Orthopedics;  Laterality: N/A;   TONSILLECTOMY     as child   torn rotator cuff  2010   right   VIDEO BRONCHOSCOPY N/A 11/17/2020   Procedure: VIDEO BRONCHOSCOPY WITH FLUORO;  Surgeon: Laurin Coder, MD;  Location: [facility];  Service: Endoscopy;  Laterality: N/A;    Current Outpatient Medications  Medication Sig Dispense Refill   albuterol (PROVENTIL HFA) 108 (90 Base) MCG/ACT inhaler TAKE 2 PUFFS BY MOUTH EVERY 6 HOURS AS NEEDED FOR WHEEZE OR SHORTNESS OF BREATH 8 g 5   atorvastatin (LIPITOR) 10 MG tablet TAKE 1 TABLET BY MOUTH EVERY DAY 90 tablet 1   DALVANCE 500 MG SOLR Inject 500 mg into the vein every 14 (fourteen) days. Pt states this is a every 2 week antibiotic. Could not state how long therapy should be.     dextromethorphan-guaiFENesin (Lipan DM)  30-600 MG 12hr tablet Take 1 tablet by mouth 2 (two) times daily.     digoxin (LANOXIN) 0.125 MG tablet TAKE 1 TABLET BY MOUTH EVERY DAY 90 tablet 1   famciclovir (FAMVIR) 500 MG tablet Take 1 tablet (500 mg total) by mouth 3 (three) times daily. 21 tablet 0   fluticasone (FLONASE) 50 MCG/ACT nasal spray SPRAY 2 SPRAYS INTO EACH NOSTRIL EVERY DAY 16 mL 1   glucose blood (CONTOUR NEXT TEST) test strip Check blood sugar once daily 100 each 12   insulin glargine (LANTUS) 100 UNIT/ML Solostar Pen Inject 10 Units into the skin daily. 15 mL 0   Insulin Pen Needle (B-D UF III MINI PEN NEEDLES) 31G X 5 MM MISC USE WITH LANTUS 100 each 0   metFORMIN (GLUCOPHAGE) 1000 MG tablet TAKE 1 TABLET (1,000 MG TOTAL) BY MOUTH TWICE A DAY WITH FOOD 180 tablet 0   oxyCODONE-acetaminophen (PERCOCET/ROXICET) 5-325 MG tablet Take 1 tablet by mouth every 4 (four) hours as needed for moderate pain.     pantoprazole (PROTONIX) 40 MG tablet TAKE 1 TABLET BY MOUTH TWICE A DAY  BEFORE A MEAL 180 tablet 1   predniSONE (DELTASONE) 10 MG tablet TAKE 1 TABLET (10 MG TOTAL) BY MOUTH DAILY WITH BREAKFAST. (Patient taking differently: Take 10 mg by mouth daily with breakfast. Taking 1/2 tablet daily) 30 tablet 3   pregabalin (LYRICA) 75 MG capsule TAKE 1 CAPSULE BY MOUTH TWICE A DAY (Patient taking differently: Take 75 mg by mouth 2 (two) times daily.) 60 capsule 3   sitaGLIPtin (JANUVIA) 50 MG tablet Take 1 tablet (50 mg total) by mouth daily. 30 tablet 3   STIOLTO RESPIMAT 2.5-2.5 MCG/ACT AERS Inhale 2 puffs into the lungs daily. 1 each 5   tamsulosin (FLOMAX) 0.4 MG CAPS capsule Take 2 capsules (0.8 mg total) by mouth daily. (Patient taking differently: Take 0.8 mg by mouth at bedtime.) 180 capsule 3   tiZANidine (ZANAFLEX) 4 MG tablet Take 1 tablet (4 mg total) by mouth every 6 (six) hours as needed. (Patient taking differently: Take 4 mg by mouth every 6 (six) hours as needed for muscle spasms.) 120 tablet 3   venlafaxine XR (EFFEXOR-XR) 150 MG 24 hr capsule Take 1 capsule (150 mg total) by mouth daily with breakfast. 90 capsule 0   warfarin (COUMADIN) 5 MG tablet TAKE 1 TO 2 TABLETS DAILY OR AS PRESCRIBED BY COUMADIN CLINIC 130 tablet 0   zolpidem (AMBIEN) 5 MG tablet Take 1 tablet (5 mg total) by mouth at bedtime as needed. for sleep 30 tablet 0   colchicine 0.6 MG tablet Take 1 tablet (0.6 mg total) by mouth 2 (two) times daily for 14 days. 28 tablet 0   Current Facility-Administered Medications  Medication Dose Route Frequency Provider Last Rate Last Admin   testosterone cypionate (DEPOTESTOSTERONE CYPIONATE) injection 200 mg  200 mg Intramuscular Q14 Days Saguier, Percell Miller, PA-C   200 mg at 07/06/21 6568    Allergies  Allergen Reactions   Daptomycin     Drug induced pneumonia    Diltiazem Other (See Comments)    Passed out    Social History   Socioeconomic History   Marital status: Married    Spouse name: Not on file   Number of children: Not on file   Years  of education: Not on file   Highest education level: Not on file  Occupational History   Not on file  Tobacco Use   Smoking status: Former  Packs/day: 1.00    Years: 20.00    Total pack years: 20.00    Types: Cigarettes    Quit date: 07/18/1989    Years since quitting: 32.6   Smokeless tobacco: Never   Tobacco comments:    Former smoker 03/16/22  Vaping Use   Vaping Use: Never used  Substance and Sexual Activity   Alcohol use: Yes    Alcohol/week: 2.0 standard drinks of alcohol    Types: 2 Standard drinks or equivalent per week    Comment: couple beers here and there 03/16/22   Drug use: No   Sexual activity: Yes    Comment: lives with wife, still working, no dietary restrictions, continues to exercise intermittently  Other Topics Concern   Not on file  Social History Narrative   Not on file   Social Determinants of Health   Financial Resource Strain: Low Risk  (01/27/2021)   Overall Financial Resource Strain (CARDIA)    Difficulty of Paying Living Expenses: Not hard at all  Food Insecurity: No Food Insecurity (01/27/2021)   Hunger Vital Sign    Worried About Running Out of Food in the Last Year: Never true    [place] in the Last Year: Never true  Transportation Needs: No Transportation Needs (01/27/2021)   PRAPARE - Hydrologist (Medical): No    Lack of Transportation (Non-Medical): No  Physical Activity: Sufficiently Active (01/27/2021)   Exercise Vital Sign    Days of Exercise per Week: 7 days    Minutes of Exercise per Session: 30 min  Stress: No Stress Concern Present (01/27/2021)   [place]    Feeling of Stress : Not at all  Social Connections: Moderately Integrated (01/27/2021)   Social Connection and Isolation Panel [NHANES]    Frequency of Communication with Friends and Family: More than three times a week    Frequency of Social Gatherings with Friends and  Family: More than three times a week    Attends Religious Services: More than 4 times per year    Active Member of [employer] or Organizations: No    Attends Archivist Meetings: Never    Marital Status: Married  Human resources officer Violence: Not At Risk (01/27/2021)   Humiliation, Afraid, Rape, and Kick questionnaire    Fear of Current or Ex-Partner: No    Emotionally Abused: No    Physically Abused: No    Sexually Abused: No     ROS- All systems are reviewed and negative except as per the HPI above.  Physical Exam: Vitals:   03/16/22 1508  BP: 122/78  Pulse: (!) 122  Weight: 83.5 kg  Height: '5\' 10"'$  (1.778 m)    GEN- The patient is a well appearing elderly male, alert and oriented x 3 today.   Head- normocephalic, atraumatic Eyes-  Sclera clear, conjunctiva pink Ears- hearing intact Oropharynx- clear Neck- supple  Lungs- Clear to ausculation bilaterally, normal work of breathing Heart- Regular rate and rhythm, tachycardia, no murmurs, rubs or gallops  GI- soft, NT, ND, + BS Extremities- no clubbing, cyanosis, or edema MS- no significant deformity or atrophy Skin- no rash or lesion Psych- euthymic mood, full affect Neuro- strength and sensation are intact  Wt Readings from Last 3 Encounters:  03/16/22 83.5 kg  03/15/22 83.9 kg  02/10/22 84.6 kg    EKG today demonstrates  Sinus tachycardia Vent. rate 122 BPM PR interval 132 ms QRS  duration 94 ms QT/QTcB 324/461 ms  Echo 07/20/21 demonstrated   1. Left ventricular ejection fraction, by estimation, is 60 to 65%. The  left ventricle has normal function. The left ventricle has no regional  wall motion abnormalities. Left ventricular diastolic parameters are  indeterminate.   2. Right ventricular systolic function is normal. The right ventricular  size is normal. There is normal pulmonary artery systolic pressure.   3. The mitral valve is normal in structure. No evidence of mitral valve  regurgitation. No  evidence of mitral stenosis.   4. The aortic valve is tricuspid. Aortic valve regurgitation is not  visualized. No aortic stenosis is present.   5. The inferior vena cava is normal in size with greater than 50%  respiratory variability, suggesting right atrial pressure of 3 mmHg.   Comparison(s): A prior study was performed on 12/30.2022. No significant  change from prior study.   Epic records are reviewed at length today  CHA2DS2-VASc Score = 4  The patient's score is based upon: CHF History: 0 HTN History: 1 Diabetes History: 1 Stroke History: 0 Vascular Disease History: 0 Age Score: 2 Gender Score: 0       ASSESSMENT AND PLAN: 1. Paroxysmal Atrial Fibrillation (ICD10:  I48.0) The patient's CHA2DS2-VASc score is 4, indicating a 4.8% annual risk of stroke.   S/p afib ablation 01/27/22 Patient appears to be maintaining SR. He is tachycardic today, no CP or SOB. Lab work from PCP yesterday reviewed, TSH normal, only mild anemia, nothing to explain tachycardia. No symptoms of infection. Patient reports his resting heart rate is usually mid to upper 90s at home. Patient will continue to track with home pulse oximeter.  Continue warfarin Continue digoxin 0.125 mg daily  2. Secondary Hypercoagulable State (ICD10:  D68.69) The patient is at significant risk for stroke/thromboembolism based upon his CHA2DS2-VASc Score of 4.  Continue Warfarin (Coumadin).   3. HTN Stable, no changes today.   Follow up with Dr Curt Bears as scheduled.    [facility] [address] [place], [place] [address] [phone] 03/16/2022 3:45 PM

## 2022-03-18 ENCOUNTER — Encounter: Payer: Self-pay | Admitting: Family Medicine

## 2022-03-22 ENCOUNTER — Encounter: Payer: Self-pay | Admitting: Family Medicine

## 2022-03-22 ENCOUNTER — Other Ambulatory Visit: Payer: Self-pay | Admitting: Family Medicine

## 2022-03-22 DIAGNOSIS — H16142 Punctate keratitis, left eye: Secondary | ICD-10-CM | POA: Diagnosis not present

## 2022-03-22 MED ORDER — ZOLPIDEM TARTRATE 10 MG PO TABS: 5.0000 mg | ORAL_TABLET | Freq: Every evening | ORAL | 1 refills | Status: DC | PRN

## 2022-03-23 ENCOUNTER — Other Ambulatory Visit (INDEPENDENT_AMBULATORY_CARE_PROVIDER_SITE_OTHER): Payer: Medicare Other

## 2022-03-23 ENCOUNTER — Other Ambulatory Visit: Payer: Self-pay

## 2022-03-23 DIAGNOSIS — I10 Essential (primary) hypertension: Secondary | ICD-10-CM | POA: Diagnosis not present

## 2022-03-23 LAB — COMPREHENSIVE METABOLIC PANEL
ALT: 15 U/L (ref 0–53)
AST: 21 U/L (ref 0–37)
Albumin: 4.1 g/dL (ref 3.5–5.2)
Alkaline Phosphatase: 90 U/L (ref 39–117)
BUN: 13 mg/dL (ref 6–23)
CO2: 30 mEq/L (ref 19–32)
Calcium: 9.2 mg/dL (ref 8.4–10.5)
Chloride: 91 mEq/L — ABNORMAL LOW (ref 96–112)
Creatinine, Ser: 0.78 mg/dL (ref 0.40–1.50)
GFR: 85.38 mL/min (ref >60.00)
Glucose, Bld: 159 mg/dL — ABNORMAL HIGH (ref 70–99)
Potassium: 4.4 mEq/L (ref 3.5–5.1)
Sodium: 129 mEq/L — ABNORMAL LOW (ref 135–145)
Total Bilirubin: 0.7 mg/dL (ref 0.2–1.2)
Total Protein: 6 g/dL (ref 6.0–8.3)

## 2022-03-24 ENCOUNTER — Ambulatory Visit: Payer: Medicare Other | Admitting: Primary Care

## 2022-03-24 ENCOUNTER — Telehealth: Payer: Self-pay | Admitting: Primary Care

## 2022-03-24 ENCOUNTER — Ambulatory Visit (INDEPENDENT_AMBULATORY_CARE_PROVIDER_SITE_OTHER): Payer: Medicare Other | Admitting: Internal Medicine

## 2022-03-24 DIAGNOSIS — J84112 Idiopathic pulmonary fibrosis: Secondary | ICD-10-CM

## 2022-03-24 DIAGNOSIS — J439 Emphysema, unspecified: Secondary | ICD-10-CM

## 2022-03-24 DIAGNOSIS — M4325 Fusion of spine, thoracolumbar region: Secondary | ICD-10-CM | POA: Diagnosis not present

## 2022-03-24 DIAGNOSIS — M4635 Infection of intervertebral disc (pyogenic), thoracolumbar region: Secondary | ICD-10-CM | POA: Diagnosis not present

## 2022-03-24 DIAGNOSIS — R0602 Shortness of breath: Secondary | ICD-10-CM | POA: Diagnosis not present

## 2022-03-24 DIAGNOSIS — J9601 Acute respiratory failure with hypoxia: Secondary | ICD-10-CM | POA: Diagnosis not present

## 2022-03-24 DIAGNOSIS — G062 Extradural and subdural abscess, unspecified: Secondary | ICD-10-CM | POA: Diagnosis not present

## 2022-03-24 LAB — PULMONARY FUNCTION TEST
DL/VA % pred: 84 %
DL/VA: 3.33 ml/min/mmHg/L
DLCO cor % pred: 56 %
DLCO cor: 14.05 ml/min/mmHg
DLCO unc % pred: 52 %
DLCO unc: 12.95 ml/min/mmHg
FEF 25-75 Pre: 1.91 L/sec
FEF2575-%Pred-Pre: 92 %
FEV1-%Pred-Pre: 75 %
FEV1-Pre: 2.23 L
FEV1FVC-%Pred-Pre: 107 %
FEV6-%Pred-Pre: 74 %
FEV6-Pre: 2.87 L
FEV6FVC-%Pred-Pre: 106 %
FVC-%Pred-Pre: 69 %
FVC-Pre: 2.88 L
Pre FEV1/FVC ratio: 78 %
Pre FEV6/FVC Ratio: 100 %

## 2022-03-24 MED ORDER — TRELEGY ELLIPTA 200-62.5-25 MCG/ACT IN AEPB: 1.0000 | INHALATION_SPRAY | Freq: Every day | RESPIRATORY_TRACT | 0 refills | Status: DC

## 2022-03-24 NOTE — Telephone Encounter (Signed)
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 He did not tolerate esbriet. He has a history CAD and is on coumadin. Do you want to consider OFEV or discuss research options? He has not seen you since March. Your first opening is in November

## 2022-03-24 NOTE — Patient Instructions (Signed)
Spirometry and DLCO Performed Today.  

## 2022-03-24 NOTE — Progress Notes (Signed)
Spirometry and DLCO Performed Today.  

## 2022-03-24 NOTE — Telephone Encounter (Signed)
Beth  Thank you for the question  So there was only a 0.5% increase in MI in inpulseis trial amongst also got nintedanib.  Clinically but not seeing this at least anecdotally speaking.  Therefore its not contraindication but more like a caution.  That is why and coronary artery disease patients we give pirfenidone as first-line but if it does not work then nintedanib.  As long as he understands the risks and benefits.  Regarding anticoagulation similar.  There is some reported history of GI bleed and diverticulitis.  But again these are black box warnings that are not seen commonly.  As long as he understands the risk and benefits that is fine.  Sometimes 20% of patients who had intolerance to pirfenidone do not want to even start nintedanib just hearing about it.  But being standard of care I think you should offer nintedanib  Certainly we can consider him for clinical trials if he does not want to do nintedanib now or if he is stable on nintedanib for at least 2-4 months  Let me know his thoughts    SIGNATURE    Dr. Brand Males, M.D., F.C.C.P,  Pulmonary and Critical Care Medicine Staff Physician, [place] Director - Interstitial Lung Disease  Program  Medical Director - [facility] [place], [place], [address]  NPI Number:  NPI #[id number] [place] Number: [id number]  Pager: [phone], If no answer  -> Check AMION or Try [phone] Telephone (clinical office): [phone] Telephone (research): [phone]  11:36 AM 03/24/2022

## 2022-03-24 NOTE — Patient Instructions (Addendum)
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  Treatment is antifibrotic medication to slow progression.  We tried you on Esbriet but you did not tolerate due to headache and nausea.  I will discuss with Dr. Chase Caller about trying you on another medication called Ofev.  If this is not an option we can discuss potential research studies   Recommendations - Stop Stiolto - Start Trelegy 1 puff daily in the morning (rinse mouth after use-if shortness of breath and wheezing symptoms are better on this inhaler please notify our office and we can send a prescription in in place of Stiolto) - Take 20 mg of prednisone x5 days; then 10 mg x 5 days; then stay on 5 mg daily until follow-up - Make sure to have follow-up labs with PCP in 1 week due to sodium levels  Orders: - Ambulatory walk test on forehead probe x3 laps  Follow-up - First available 30-minute slot with Dr. Chase Caller (he should have openings in November)

## 2022-03-24 NOTE — Progress Notes (Unsigned)
$'@Patient'D$  ID: Luis Brown, male    DOB: 03-28-1946, 76 y.o.   MRN: [id number]  No chief complaint on file.   Referring provider: Elise Benne  HPI: 76 year old male, former smoker (20-pack-year history).  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   Previous [facility] pulmonary encounters:  12/29/20- Dr. Chase Caller Chief Complaint  Patient presents with   Follow-up    PFT  performed 5/24.  Pt states since last visit, he has been doing okay. States he has lost some weight that he has noticed since last visit.    Follow-up drug-induced pneumonitis daptomycin from early May 2022  HPI Luis Brown 76 y.o. -returns for follow-up.  I personally saw him in the hospital on 11/20/2020.  At that point in time he had been started on Solu-Medrol on 11/18/2020.  When I saw him he was on day 3 of Solu-Medrol and he was needing 3 L nasal cannula oxygen at rest.  At that point in time even with 3 L he was desaturating to 86% just talking.  Put him on prednisone 60 mg/day.  After that he see nurse practitioner x2 at least 1 of which was a telephone visit..  As part of his follow-up on 12/11/2020 he had a CT scan of the chest [3 days after pulmonary function test] and on my personal visualization it showed new onset pneumothorax but significant improvement in infiltrates and inflammation.  He had another follow-up chest x-ray 12/17/2020 in which no pneumothorax reported [similar on 12/15/2020] and pulmonary infiltrates appear improved.  He is now here with his wife  He tells me he is now on 10 mg/day prednisone he just darted that today.  He is 1 week left and after that the prednisone taper ends.  Because of the pneumothorax is physical therapy has been on hold but 4 days  ago he was evaluated and apparently his pulse ox was 100% but this was on oxygen.  He continues to use his oxygen although he is not sure if he is desaturating anymore.  He does feel overall improved compared to 6 weeks ago but still has significant amount of fatigue.  He has significant insomnia which he believes is due to prednisone.  Today's last night was the first night he could sleep when he dropped his prednisone down to 10 mg/day.  He believes his fatigue is multifactorial related to the disease, multiple medications, lack of oxygen, insomnia etc.  02/09/2021 Patient presents today for 1 month follow-up. Since his last visit in June, he has established with cardiology and was dx with afib. He is currently on Xarelto and Digoxin. His wife states that he has had more energy recently. He is scheduled for testosterone injection tomorrow. He gets winded with stairs and heavy exertion. He reports intermittent wheezing. He needs refill of albuterol rescue inhaler. He has weaned himself of oxygen and O2 equipment has been picked up. He has been sleeping better at night. Anxiety has improved, he stopped xanax. Due for follow-up CXR today. 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  02/23/2021 Patient presents today for 2 week follow-up. During his last visit he was given trial Spiriva Respimat d/t dyspnea /wheezing symptoms and moderate obstruction seen on his PFTs. He has been using sample of Spiriva Respimat 2.39mg as prescribed, he occasionally  misses a dose for 1-2 days. He states that his wheezing has stopped but he has not noticed a significant difference in dyspnea symptoms. His ILD scale appears improved from two weeks go. He has having some muscle spasms in his back that started before being placed on LABA.   04/26/2021 -   Chief Complaint  Patient presents with   Follow-up    F/u, trelegy medication concerns    Luis Brown 76 y.o.  -returns for follow-up.  Since I last saw him he seen nurse practitioner 2 times.  Symptom scores for dyspnea continue to improve.  He was also walked for oxygen desaturation February 04, 2019 did not desaturate.  He says he continues to get better.  He is off prednisone.  Nurse practitioner noticed obstructive physiology on the pulmonary function test.  Put him on inhaler.  Currently is on Trelegy.  He says it helps him tremendously.  He feels it opens up his lungs.  He declined his flu shot today because he will have it after the COVID-vaccine in October or November 2022.  06/24/2021 Patient presents today for acute OV/cough. Patient reports increased shortness of breath since 06/13/21 with associated fatigue, cough and chest tightness. Cough is productive with clear mucus. Symptoms started after Thanksgiving. His wife had the flu at the same time. His cough initially improved while taking mucinex, he stopped since he was starting to feel better. Denies f/c/s, purulent mucus, N/V/D. CAT score 17.    09/28/2021 -   Chief Complaint  Patient presents with   Follow-up    Pt states he still becomes SOB with exertion and also has an occasional cough. States the cough happens throughout the day and will get some phlegm up that is clear to yellow tinge.   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 And PE with "ILD Falare" Dec 2022/Jan 2023   He also has paroxysmal A. fib, low testosterone, chronic hyponatremia, chronic opioid intake.,  Chronic Lyrica  HPI Luis Brown 76 y.o. -returns for follow-up.  Since his last visit he has continued on prednisone 10 mg/day.  He continues to have shortness of breath.  In fact this is unchanged.  He does tell me though starting spring season he has had allergic rhinitis.  Wife states he has seasonal allergies for many years.  He is also having worsening cough.  He took a antihistamine and is somewhat better.  He continues to  schedule Stiolto.  He has upcoming atrial fibrillation ablation end of March 2023.  Also in mid February 2023 had another CT angiogram chest that ruled out pulmonary embolism [done while on anticoagulation].  I personally visualized the CT chest and compared to late December 2023/early January 2023 the findings are better.  There might be some residual scarring and potential emphysema.  We walked him today and his walking desaturation test held up.  He did not desaturate.  There is a huge improvement.  Of note he was supposed have pulmonary function test today but this was not performed.  He does not know why.  Apologized for any missteps.    For his pulmonary embolism: It appears that he might not be taking his testosterone shots anymore.  Regarding seasonal allergies: He has not had work-up for this.  But currently it is active.   12/30/2021 Patient presents today for a 3 month follow-up with PFTs. Patient needs simple walk test, symptom scale and FENO today.  Patient is maintained on  Stiolto. Has not required albuterol. He has chronic shortness of breath and fatigue symptoms which are significantly better since last visit. No cough. He remains on '10mg'$  prednisone daily. No oxygen reqiuirments during daytime. Using 1L oxygen at bedtime.  HRCT on 12/28/2021 showed regression of subpleural and basilar predominant fibrotic interstitial lung disease compared to May 2022.  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 PFTs showed improvement in FEV1 but decreased diffusion capacity. Eosinophil absolute was 100 and March.  Respiratory allergy panel was negative. FENO today was normal. Discussed starting antifibrtoic to slow progression of pulmonary fibrosis.   02/10/2022 Patient presents today for 1 month follow-up/ new antifibrotic start. 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 He started Esbriet 1.5 weeks ago d/t gout flare. He is tolerating medication well, currently on 2 tablets three times a day. No GI symptoms. Dyspnea  appears some better overall. No acute respiratory symptoms. No active cough, mucinex-dm helps when needed. He took 3/4 mild walk the other day without issues. Continues on Stiolto, prn albuterol and '5mg'$  prednisone daily. Overnight oximetry test on 01/24/22 showed that patient spent 5 min with SPO2 less than 88%. SPO2 low 85% and baseline 93%. Patient started on 1L supplemental oxygen at bedtime.   03/24/2022- Interim hx  Patient presents today 2 month follow-up/ ILD. Patient had spirometry with DLCO testing today. He was unable to tolerate antifibrotic medication Esbriet. He had nausea, headache and muscle aches while taking 2 tablets Esbriet three times a day. He was never able to get to full dose. He had labs checked with PCP yesterday, LFTs were normal. Sodium was low and needs recheck in 1 week   He reports increased sob symptoms last couple of weeks. He has associated wheezing and morning mucus production. He takes mucinex first thing in the morning which helps. He in on '5mg'$  prednisone daily. Spirometry showed decline in FEV1 and diffusion capacity compared to June.   Pulmonary testing 03/24/2022 Spirometry>> FVC 2.88 (69%), FEV1 2.23 (75%), ratio 78, DLCOcor 14.05 (56%)  SYMPTOM SCALE - ILD 08/12/2021 09/28/2021  12/30/2021  02/10/2022  03/24/2022   Current weight   177lb  182 lbs  O2 use 2L [place] but at rest he was 95%  RA RA  RA   Shortness of Breath 0 -> 5 scale with 5 being worst (score 6 If unable to do)      At rest 1 1 0 0 0  Simple tasks - showers, clothes change, eating, shaving 3 2 0 0 0  Household (dishes, doing bed, laundry) '3 2 1 '$ 0 3  Shopping xno 3 1 0 3  Walking level at own pace 2 1.5 0 0 0  Walking up Stairs 3 2.'5 2 1 4  '$ Total (30-36) Dyspnea Score '12 12 4 1 10  '$ How bad is your cough? Not often 3.5 Not often 0 1  How bad is your fatigue A lot 2 2 2.5 1  How bad is nausea 0 0 0 0 0  How bad is vomiting?  0 0 0 0 0  How bad is diarrhea? 0 0 0 0 0  How bad is anxiety? some 1 0 0  0  How bad is depression A little 0 0.5 0 0  Any chronic pain - if so where and how bad x  Back  Back  Back    Simple office walk 185 feet x  3 laps goal with forehead probe 08/12/2021  09/28/2021  12/30/2021   O2 used ra ra RA  Number laps completed 1  3  Comments about pace steady  Steady  Resting Pulse Ox/HR 95% and 89/min 99% and HR 90 97%; HR 97   Final Pulse Ox/HR 86% and 122/min 94% and HR 114 98%; HR 120  Desaturated </= 88% no no No  Desaturated <= 3% points yes Yes, 5 ponts No  Got Tachycardic >/= 90/min yes yes Yes  Symptoms at end of test dyspnea Mild does None  Miscellaneous comments x improve x        Allergies  Allergen Reactions   Daptomycin     Drug induced pneumonia    Diltiazem Other (See Comments)    Passed out    Immunization History  Administered Date(s) Administered   Fluad Quad(high Dose 65+) 04/05/2019, 06/29/2020, 05/18/2021   Influenza Split 04/23/2012   Influenza Whole 02/16/2011   Influenza, High Dose Seasonal PF 05/06/2013, 03/25/2016, 05/05/2017, 06/07/2018   PFIZER(Purple Top)SARS-COV-2 Vaccination 08/23/2019, 09/13/2019, 06/07/2020   Pfizer Covid-19 Vaccine Bivalent Booster 23yr & up 04/29/2021   Pneumococcal Conjugate-13 03/25/2016   Pneumococcal Polysaccharide-23 01/20/2009, 10/19/2011   Td 07/09/2003, 07/18/2008   Tdap 07/20/2011   Zoster, Live 04/27/2016    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   Esophageal reflux 02/10/2015   Fatigue    HTN (hypertension)    Hyperglycemia    preDM     Hyperlipidemia    Insomnia    Low back pain radiating to both legs 01/16/2017   Measles as a child   Overweight(278.02)    Peripheral neuropathy 08/07/2019   Personal history of colonic polyps 10/27/2012   [facility]   Pneumonia    " walking"   Preventative health care    Testosterone deficiency 05/23/2012   Wears glasses     Tobacco History: Social History   Tobacco Use  Smoking Status Former   Packs/day: 1.00   Years: 20.00   Total pack years: 20.00   Types: Cigarettes   Quit date: 07/18/1989   Years since quitting: 32.7  Smokeless Tobacco Never  Tobacco Comments   Former smoker 03/16/22   Counseling given: Not Answered Tobacco comments: Former smoker 03/16/22   Outpatient Medications Prior to Visit  Medication Sig Dispense Refill   albuterol (PROVENTIL HFA) 108 (90 Base) MCG/ACT inhaler TAKE 2 PUFFS BY MOUTH EVERY 6 HOURS AS NEEDED FOR WHEEZE OR SHORTNESS OF BREATH 8 g 5   atorvastatin (LIPITOR) 10 MG tablet TAKE 1 TABLET BY MOUTH EVERY DAY 90 tablet 1   colchicine 0.6 MG tablet Take 1 tablet (0.6 mg total) by mouth 2 (two) times daily for 14 days. 28 tablet 0   DALVANCE 500 MG SOLR Inject 500 mg into the vein every 14 (fourteen) days. Pt states this is a every 2 week antibiotic. Could not state how long therapy should be.     dextromethorphan-guaiFENesin (MUCINEX DM) 30-600 MG 12hr tablet Take 1 tablet by mouth 2 (two) times daily.     digoxin (LANOXIN) 0.125  MG tablet TAKE 1 TABLET BY MOUTH EVERY DAY 90 tablet 1   famciclovir (FAMVIR) 500 MG tablet Take 1 tablet (500 mg total) by mouth 3 (three) times daily. 21 tablet 0   fluticasone (FLONASE) 50 MCG/ACT nasal spray SPRAY 2 SPRAYS INTO EACH NOSTRIL EVERY DAY 16 mL 1   glucose blood (CONTOUR NEXT TEST) test strip Check blood sugar once daily 100 each 12   insulin glargine (LANTUS) 100 UNIT/ML Solostar Pen Inject 10 Units into the skin daily. 15 mL 0   Insulin Pen Needle (B-D UF III MINI PEN  NEEDLES) 31G X 5 MM MISC USE WITH LANTUS 100 each 0   metFORMIN (GLUCOPHAGE) 1000 MG tablet TAKE 1 TABLET (1,000 MG TOTAL) BY MOUTH TWICE A DAY WITH FOOD 180 tablet 0   oxyCODONE-acetaminophen (PERCOCET/ROXICET) 5-325 MG tablet Take 1 tablet by mouth every 4 (four) hours as needed for moderate pain.     pantoprazole (PROTONIX) 40 MG tablet TAKE 1 TABLET BY MOUTH TWICE A DAY BEFORE A MEAL 180 tablet 1   predniSONE (DELTASONE) 10 MG tablet TAKE 1 TABLET (10 MG TOTAL) BY MOUTH DAILY WITH BREAKFAST. (Patient taking differently: Take 10 mg by mouth daily with breakfast. Taking 1/2 tablet daily) 30 tablet 3   pregabalin (LYRICA) 75 MG capsule TAKE 1 CAPSULE BY MOUTH TWICE A DAY (Patient taking differently: Take 75 mg by mouth 2 (two) times daily.) 60 capsule 3   sitaGLIPtin (JANUVIA) 50 MG tablet Take 1 tablet (50 mg total) by mouth daily. 30 tablet 3   STIOLTO RESPIMAT 2.5-2.5 MCG/ACT AERS Inhale 2 puffs into the lungs daily. 1 each 5   tamsulosin (FLOMAX) 0.4 MG CAPS capsule Take 2 capsules (0.8 mg total) by mouth daily. (Patient taking differently: Take 0.8 mg by mouth at bedtime.) 180 capsule 3   tiZANidine (ZANAFLEX) 4 MG tablet Take 1 tablet (4 mg total) by mouth every 6 (six) hours as needed. (Patient taking differently: Take 4 mg by mouth every 6 (six) hours as needed for muscle spasms.) 120 tablet 3   venlafaxine XR (EFFEXOR-XR) 150 MG 24 hr capsule Take 1 capsule (150 mg total) by mouth daily with breakfast. 90 capsule 0   warfarin (COUMADIN) 5 MG tablet TAKE 1 TO 2 TABLETS DAILY OR AS PRESCRIBED BY COUMADIN CLINIC 130 tablet 0   zolpidem (AMBIEN) 10 MG tablet Take 0.5-1 tablets (5-10 mg total) by mouth at bedtime as needed for sleep. 15 tablet 1   Facility-Administered Medications Prior to Visit  Medication Dose Route Frequency Provider Last Rate Last Admin   testosterone cypionate (DEPOTESTOSTERONE CYPIONATE) injection 200 mg  200 mg Intramuscular Q14 Days Saguier, Percell Miller, PA-C   200 mg at  07/06/21 9798   Review of Systems  Review of Systems  Constitutional:  Negative for fatigue.  HENT:  Positive for congestion.   Respiratory:  Positive for cough, shortness of breath and wheezing.   Cardiovascular: Negative.      Physical Exam  There were no vitals taken for this visit. Physical Exam Constitutional:      Appearance: Normal appearance.  HENT:     Head: Normocephalic and atraumatic.  Cardiovascular:     Rate and Rhythm: Normal rate and regular rhythm.  Pulmonary:     Breath sounds: Wheezing and rales present.  Skin:    General: Skin is warm and dry.  Neurological:     General: No focal deficit present.     Mental Status: He is alert and oriented to person,  place, and time. Mental status is at baseline.  Psychiatric:        Mood and Affect: Mood normal.        Behavior: Behavior normal.        Thought Content: Thought content normal.        Judgment: Judgment normal.      Lab Results:  CBC    Component Value Date/Time   WBC 8.2 03/15/2022 1047   RBC 4.02 (L) 03/15/2022 1047   HGB 12.1 (L) 03/15/2022 1047   HGB 11.9 (L) 10/05/2021 1429   HGB 12.8 (L) 11/18/2020 1608   HCT 35.5 (L) 03/15/2022 1047   PLT 342.0 03/15/2022 1047   PLT 322 10/05/2021 1429   MCV 88.4 03/15/2022 1047   MCH 29.4 01/27/2022 0921   MCHC 34.1 03/15/2022 1047   RDW 16.3 (H) 03/15/2022 1047   LYMPHSABS 0.8 10/05/2021 1429   MONOABS 0.9 10/05/2021 1429   EOSABS 0.2 10/05/2021 1429   BASOSABS 0.1 10/05/2021 1429    BMET    Component Value Date/Time   NA 129 (L) 03/23/2022 0752   NA 134 01/25/2022 1416   K 4.4 03/23/2022 0752   CL 91 (L) 03/23/2022 0752   CO2 30 03/23/2022 0752   GLUCOSE 159 (H) 03/23/2022 0752   BUN 13 03/23/2022 0752   BUN 10 01/25/2022 1416   CREATININE 0.78 03/23/2022 0752   CREATININE 0.85 10/05/2021 1429   CREATININE 0.66 (L) 03/15/2021 1519   CALCIUM 9.2 03/23/2022 0752   GFRNONAA >60 10/05/2021 1429   GFRNONAA 84 12/04/2020 1523   GFRAA  98 12/04/2020 1523    BNP    Component Value Date/Time   BNP 82.0 07/17/2021 0508    ProBNP No results found for: "PROBNP"  Imaging: No results found.   Assessment & Plan:   No problem-specific Assessment & Plan notes found for this encounter.     Martyn Ehrich, NP 03/24/2022

## 2022-03-25 DIAGNOSIS — M4635 Infection of intervertebral disc (pyogenic), thoracolumbar region: Secondary | ICD-10-CM | POA: Diagnosis not present

## 2022-03-25 DIAGNOSIS — M4325 Fusion of spine, thoracolumbar region: Secondary | ICD-10-CM | POA: Diagnosis not present

## 2022-03-27 NOTE — Assessment & Plan Note (Addendum)
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 He was unable to tolerate Esbriet (pirfenidone) d/t nausea, headache and muscle aches. Spirometry today showed decline in FEV1 and diffusion capacity compared to June. Discussed with Dr. Chase Caller, we will trial him on another antifibrotics called OFEV (nintedanib) '150mg'$  twice daily. Sending in prednisone d/t worsening respiratory symptoms, advised he take '20mg'$  x5 days; then 10 mg x 5 days; then stay on 5 mg daily until follow-up. FU in 6 weeks with Dr. Chase Caller (30 min slot).

## 2022-03-27 NOTE — Assessment & Plan Note (Signed)
-   Increased symptoms burden. Changing Stiolto to Trelegy 194mg 1 puff daily

## 2022-03-30 ENCOUNTER — Encounter: Payer: Self-pay | Admitting: Internal Medicine

## 2022-03-30 NOTE — Telephone Encounter (Signed)
Called patient to discuss with patient, LM for him to call back

## 2022-03-31 NOTE — Telephone Encounter (Signed)
LM for patient to call back.

## 2022-04-01 ENCOUNTER — Other Ambulatory Visit: Payer: Self-pay | Admitting: Medical

## 2022-04-01 NOTE — Telephone Encounter (Signed)
Pt calling back and will Monday for a call

## 2022-04-01 NOTE — Telephone Encounter (Signed)
Attempted to call pt but unable to reach. Left message for him to return call. °

## 2022-04-04 ENCOUNTER — Encounter: Payer: Self-pay | Admitting: Internal Medicine

## 2022-04-04 ENCOUNTER — Telehealth: Payer: Self-pay | Admitting: Pharmacist

## 2022-04-04 NOTE — Telephone Encounter (Signed)
Please start Ofev BIV.  Dose: '150mg'$  twice daily  Dx: IPF (Z12.811)  Previously tried therapies: Pirfenidone - unable to tolerate; d/t nausea, headache and muscle aches.  Knox Saliva, PharmD, MPH, BCPS, CPP Clinical Pharmacist (Rheumatology and Pulmonology)

## 2022-04-04 NOTE — Telephone Encounter (Signed)
Spoke with patient and he understands risks and benefits of similar antifibrotic including increased risk for MI and GI bleed. He wants to proceed with nintedanib. Can we please start process for patient to be started on OFEV  '150mg'$  twice daily. He has an apt with MR on 11/2.

## 2022-04-04 NOTE — Telephone Encounter (Signed)
Will start Ofev BIV in separate telephone encounter  Knox Saliva, PharmD, MPH, BCPS, CPP Clinical Pharmacist (Rheumatology and Pulmonology)

## 2022-04-04 NOTE — Telephone Encounter (Signed)
Closing encounter due to multiple attempts to contact patient. Provider and nurse have tried to call. Left voicemail for patient that if he still needed our office to call us back. Nothing further needed

## 2022-04-05 DIAGNOSIS — M4624 Osteomyelitis of vertebra, thoracic region: Secondary | ICD-10-CM | POA: Diagnosis not present

## 2022-04-05 DIAGNOSIS — M150 Primary generalized (osteo)arthritis: Secondary | ICD-10-CM | POA: Diagnosis not present

## 2022-04-05 DIAGNOSIS — G894 Chronic pain syndrome: Secondary | ICD-10-CM | POA: Diagnosis not present

## 2022-04-05 DIAGNOSIS — M961 Postlaminectomy syndrome, not elsewhere classified: Secondary | ICD-10-CM | POA: Diagnosis not present

## 2022-04-05 NOTE — Telephone Encounter (Signed)
See phone note from 03/24/22. Luis Brown has already spoken to pt.

## 2022-04-05 NOTE — Telephone Encounter (Signed)
Submitted a Prior Authorization request to [facility] for OFEV via CoverMyMeds. Will update once we receive a response.  Key: [id number]  Knox Saliva, PharmD, MPH, BCPS, CPP Clinical Pharmacist (Rheumatology and Pulmonology)

## 2022-04-06 ENCOUNTER — Ambulatory Visit: Payer: Medicare Other | Attending: Cardiovascular Disease

## 2022-04-06 ENCOUNTER — Encounter: Payer: Self-pay | Admitting: Internal Medicine

## 2022-04-06 DIAGNOSIS — Z7901 Long term (current) use of anticoagulants: Secondary | ICD-10-CM | POA: Diagnosis not present

## 2022-04-06 DIAGNOSIS — I480 Paroxysmal atrial fibrillation: Secondary | ICD-10-CM | POA: Diagnosis not present

## 2022-04-06 DIAGNOSIS — I4891 Unspecified atrial fibrillation: Secondary | ICD-10-CM | POA: Diagnosis not present

## 2022-04-06 LAB — POCT INR: INR: 3.2 — AB (ref 2.0–3.0)

## 2022-04-06 NOTE — Patient Instructions (Signed)
Description   Only take 1 tomorrow and then continue taking Warfarin 1.5 tablets daily except for 1 tablet on Wednesday. Recheck INR in 4 weeks. Coumadin Clinic# [phone]

## 2022-04-07 ENCOUNTER — Other Ambulatory Visit (HOSPITAL_COMMUNITY): Payer: Self-pay

## 2022-04-07 ENCOUNTER — Other Ambulatory Visit: Payer: Self-pay | Admitting: Medical

## 2022-04-07 ENCOUNTER — Other Ambulatory Visit: Payer: Self-pay | Admitting: Specialist

## 2022-04-07 ENCOUNTER — Other Ambulatory Visit: Payer: Self-pay

## 2022-04-07 ENCOUNTER — Encounter: Payer: Self-pay | Admitting: Family Medicine

## 2022-04-07 DIAGNOSIS — M4635 Infection of intervertebral disc (pyogenic), thoracolumbar region: Secondary | ICD-10-CM | POA: Diagnosis not present

## 2022-04-07 DIAGNOSIS — G062 Extradural and subdural abscess, unspecified: Secondary | ICD-10-CM | POA: Diagnosis not present

## 2022-04-07 DIAGNOSIS — M4325 Fusion of spine, thoracolumbar region: Secondary | ICD-10-CM | POA: Diagnosis not present

## 2022-04-07 NOTE — Telephone Encounter (Addendum)
Received notification from [facility] regarding a prior authorization for [employer]. Authorization has been APPROVED from 04/05/22 to 07/17/22.   Per test claim, copay for 30 days supply is [phone]  Patient can fill through [place]: [phone]   Authorization # [id number]  ATC patient to review pt assistance application. Unable to reach. Left VM requesting return call.  Provider portion placed in Dr. Golden Pop mailbox. Patient portion placed in PAP pending info folder in pharmacy office while we await  Knox Saliva, PharmD, MPH, BCPS, CPP Clinical Pharmacist (Rheumatology and Pulmonology)

## 2022-04-08 ENCOUNTER — Encounter: Payer: Self-pay | Admitting: Internal Medicine

## 2022-04-08 ENCOUNTER — Other Ambulatory Visit: Payer: Self-pay | Admitting: Medical

## 2022-04-08 MED ORDER — TRELEGY ELLIPTA 200-62.5-25 MCG/ACT IN AEPB: 1.0000 | INHALATION_SPRAY | Freq: Every day | RESPIRATORY_TRACT | 5 refills | Status: DC

## 2022-04-11 ENCOUNTER — Encounter: Payer: Self-pay | Admitting: Family Medicine

## 2022-04-11 ENCOUNTER — Other Ambulatory Visit: Payer: Self-pay | Admitting: Specialist

## 2022-04-11 MED ORDER — TRELEGY ELLIPTA 200-62.5-25 MCG/ACT IN AEPB: 1.0000 | INHALATION_SPRAY | Freq: Every day | RESPIRATORY_TRACT | 5 refills | Status: DC

## 2022-04-11 NOTE — Telephone Encounter (Signed)
Patient left VM regarding Ofev. Returned call to patient. Reviewed copay through insurance and then the patient assistance program. He is confident his annual household income for houseold of two falls below the income threshold. He requested application to be emailed.  Emailed application to pt and he is aware to return with proof of household income documents to [place] team at clinic.  Knox Saliva, PharmD, MPH, BCPS, CPP Clinical Pharmacist (Rheumatology and Pulmonology)

## 2022-04-13 ENCOUNTER — Other Ambulatory Visit (HOSPITAL_COMMUNITY): Payer: Self-pay

## 2022-04-13 NOTE — Telephone Encounter (Signed)
Provider portion has been received and placed in "Awaiting Response" folder along with supporting documentation. We are still awaiting pt portion and income documentation.

## 2022-04-15 ENCOUNTER — Telehealth: Payer: Self-pay | Admitting: Surgery

## 2022-04-15 NOTE — Telephone Encounter (Signed)
Called pt 1X and left vm for pt to reschedule PA Ricard Dillon will be in a meeting pt's appt. Pt can reschedule for later that day or next open appt date and time for [facility]

## 2022-04-18 ENCOUNTER — Encounter: Payer: Self-pay | Admitting: Internal Medicine

## 2022-04-18 NOTE — Telephone Encounter (Signed)
Submitted Patient Assistance Application to [employer] for OFEV along with provider portion, PA and income documents. Will update patient when we receive a response.  Fax# [phone] Phone# [phone] 

## 2022-04-19 NOTE — Progress Notes (Signed)
Please let patient know spirometry showed some decrease in lung function and diffusion capacity.  We will review this during his visit on November 2 with Dr. Chase Caller.

## 2022-04-20 ENCOUNTER — Ambulatory Visit: Payer: Medicare Other | Admitting: Surgery

## 2022-04-20 ENCOUNTER — Encounter: Payer: Self-pay | Admitting: Family Medicine

## 2022-04-21 ENCOUNTER — Ambulatory Visit: Payer: Medicare Other | Admitting: Surgery

## 2022-04-21 DIAGNOSIS — G062 Extradural and subdural abscess, unspecified: Secondary | ICD-10-CM | POA: Diagnosis not present

## 2022-04-21 DIAGNOSIS — M4325 Fusion of spine, thoracolumbar region: Secondary | ICD-10-CM | POA: Diagnosis not present

## 2022-04-21 DIAGNOSIS — M4635 Infection of intervertebral disc (pyogenic), thoracolumbar region: Secondary | ICD-10-CM | POA: Diagnosis not present

## 2022-04-21 NOTE — Telephone Encounter (Signed)
Patient states he received a call yesterday, returning the call- please advise.

## 2022-04-22 ENCOUNTER — Ambulatory Visit (INDEPENDENT_AMBULATORY_CARE_PROVIDER_SITE_OTHER): Payer: Medicare Other | Admitting: Medical

## 2022-04-22 ENCOUNTER — Other Ambulatory Visit (HOSPITAL_COMMUNITY): Payer: Self-pay

## 2022-04-22 VITALS — BP 142/60 | HR 76 | Ht 70.0 in | Wt 186.0 lb

## 2022-04-22 DIAGNOSIS — J84112 Idiopathic pulmonary fibrosis: Secondary | ICD-10-CM | POA: Diagnosis not present

## 2022-04-22 DIAGNOSIS — F32A Depression, unspecified: Secondary | ICD-10-CM | POA: Diagnosis not present

## 2022-04-22 DIAGNOSIS — F419 Anxiety disorder, unspecified: Secondary | ICD-10-CM

## 2022-04-22 DIAGNOSIS — Z23 Encounter for immunization: Secondary | ICD-10-CM

## 2022-04-22 DIAGNOSIS — G4700 Insomnia, unspecified: Secondary | ICD-10-CM

## 2022-04-22 MED ORDER — BUSPIRONE HCL 7.5 MG PO TABS: 7.5000 mg | ORAL_TABLET | Freq: Two times a day (BID) | ORAL | 0 refills | Status: DC

## 2022-04-22 NOTE — Progress Notes (Signed)
Subjective:    [name], male    DOB: 07-Mar-1946, 76 y.o.   MRN: [id number]  HPI  Pt in for recent high level stress, anxiety and stressed. He is having problems sleeping. He has hard time with loss of son 2 years ago. Other son moved [place]. Daughter moved to [facility].  Pt wife is optimistic  Pt understands kids have to move on and have their own life.   He struggles with pulmonary fibrosis.   He states his dogs provide some relaxation. Has 2 presently.   Insomnia- he is using ambien 5 mg at night. Recently not working.   For anxiety- he is on effexor xr 150 mg daily. He had been on valium in the past. But in past wanted to avoid narcotic and benzo.  Pt is on pain medication oxycodone. He takes 5 mg every 4 hours.       Review of Systems  Constitutional:  Negative for chills, fatigue and fever.  Respiratory:  Negative for cough, chest tightness, shortness of breath and wheezing.   Cardiovascular:  Negative for chest pain and palpitations.  Gastrointestinal:  Negative for abdominal pain, diarrhea and rectal pain.  Genitourinary:  Negative for dysuria.  Musculoskeletal:  Negative for back pain, joint swelling and myalgias.  Skin:  Negative for pallor and rash.  Neurological:  Negative for dizziness, syncope, weakness, light-headedness and headaches.  Hematological:  Negative for adenopathy. Does not bruise/bleed easily.  Psychiatric/Behavioral:  Positive for dysphoric mood. Negative for behavioral problems, confusion and suicidal ideas. The patient is nervous/anxious.    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   Esophageal reflux 02/10/2015   Fatigue    HTN (hypertension)    Hyperglycemia    preDM    Hyperlipidemia    Insomnia    Low back pain radiating to both legs 01/16/2017   Measles as a child   Overweight(278.02)    Peripheral neuropathy 08/07/2019   Personal history of colonic polyps 10/27/2012   Follows with [facility]   Pneumonia    " walking"   Preventative health care    Testosterone deficiency 05/23/2012   Wears glasses      Social History   Socioeconomic History   Marital status: Married    Spouse name: Not on file   Number of children: Not on file   Years of education: Not on file   Highest education level: Not on file  Occupational History   Not on file  Tobacco Use   Smoking status: Former    Packs/day: 1.00    Years: 20.00    Total pack years: 20.00    Types: Cigarettes    Quit date: 07/18/1989    Years since quitting: 32.7   Smokeless tobacco: Never   Tobacco comments:    Former smoker 03/16/22  Vaping Use   Vaping Use: Never used  Substance and Sexual Activity   Alcohol use: Yes    Alcohol/week: 2.0  standard drinks of alcohol    Types: 2 Standard drinks or equivalent per week    Comment: couple beers here and there 03/16/22   Drug use: No   Sexual activity: Yes    Comment: lives with wife, still working, no dietary restrictions, continues to exercise intermittently  Other Topics Concern   Not on file  Social History Narrative   Not on file   Social Determinants of Health   Financial Resource Strain: Low Risk  (01/27/2021)   Overall Financial Resource Strain (CARDIA)    Difficulty of Paying Living Expenses: Not hard at all  Food Insecurity: No Food Insecurity (01/27/2021)   Hunger Vital Sign    Worried About Running Out of Food in the Last Year: Never true    [place] in the Last Year: Never true  Transportation Needs:  No Transportation Needs (01/27/2021)   PRAPARE - Hydrologist (Medical): No    Lack of Transportation (Non-Medical): No  Physical Activity: Sufficiently Active (01/27/2021)   Exercise Vital Sign    Days of Exercise per Week: 7 days    Minutes of Exercise per Session: 30 min  Stress: No Stress Concern Present (01/27/2021)   [place]    Feeling of Stress : Not at all  Social Connections: Moderately Integrated (01/27/2021)   Social Connection and Isolation Panel [NHANES]    Frequency of Communication with Friends and Family: More than three times a week    Frequency of Social Gatherings with Friends and Family: More than three times a week    Attends Religious Services: More than 4 times per year    Active Member of [employer] or Organizations: No    Attends Archivist Meetings: Never    Marital Status: Married  Human resources officer Violence: Not At Risk (01/27/2021)   Humiliation, Afraid, Rape, and Kick questionnaire    Fear of Current or Ex-Partner: No    Emotionally Abused: No    Physically Abused: No    Sexually Abused: No    Past Surgical History:  Procedure Laterality Date   ATRIAL FIBRILLATION ABLATION N/A 01/27/2022   Procedure: ATRIAL FIBRILLATION ABLATION;  Surgeon: Constance Haw, MD;  Location: [facility];  Service: Cardiovascular;  Laterality: N/A;   BACK SURGERY  2012 and 1994   Dr Margret Chance, screws and cage in low back   BIOPSY  01/09/2020   Procedure: BIOPSY;  Surgeon: Otis Brace, MD;  Location: [facility];  Service: Gastroenterology;;   BRONCHIAL BIOPSY  11/17/2020   Procedure: BRONCHIAL BIOPSIES;  Surgeon: Laurin Coder, MD;  Location: [facility];  Service: Endoscopy;;   BRONCHIAL BRUSHINGS  11/17/2020   Procedure: BRONCHIAL BRUSHINGS;  Surgeon: Laurin Coder, MD;  Location: [facility];  Service: Endoscopy;;   BRONCHIAL WASHINGS  11/17/2020    Procedure: BRONCHIAL WASHINGS;  Surgeon: Laurin Coder, MD;  Location: [facility];  Service: Endoscopy;;   COLONOSCOPY WITH PROPOFOL N/A 01/09/2020   Procedure: COLONOSCOPY WITH PROPOFOL;  Surgeon: Otis Brace, MD;  Location: [facility];  Service: Gastroenterology;  Laterality: N/A;   ESOPHAGOGASTRODUODENOSCOPY (EGD) WITH PROPOFOL N/A 01/09/2020   Procedure: ESOPHAGOGASTRODUODENOSCOPY (EGD) WITH PROPOFOL;  Surgeon: Otis Brace, MD;  [facility]: [facility];  Service: Gastroenterology;  Laterality: N/A;   EYE SURGERY Bilateral    2016   HARDWARE REVISION  03/12/2019   REVISION OF HARDWARE THORACIC TEN-THORACIC ELEVEN WITH APPLICATION OF ADDITIONAL RODS AND ROD SLEEVES  THORACIC EIGHT-THORACIC TWELVE 12-LUMBAR ONE (N/A )   HEMOSTASIS CONTROL  11/17/2020   Procedure: HEMOSTASIS CONTROL;  Surgeon: Laurin Coder, MD;  Location: [facility];  Service: Endoscopy;;   IR US GUIDE BX ASP/DRAIN  11/13/2020   LAMINECTOMY  02/12/2019   DECOMPRESSIVE LAMINECTOMY THORACIC NINE-THORACIC TEN AND THORACIC TEN-THORACIC ELEVEN, EXTENSION OF THORACOLUMBAR FUSION FROM THORACIC TEN TO THORACIC FIVE, LOCAL BONE GRAFT, ALLOGRAFT AND VIVIGEN (N/A)   POLYPECTOMY  01/09/2020   Procedure: POLYPECTOMY;  Surgeon: Otis Brace, MD;  Location: [facility];  Service: Gastroenterology;;   REMOVAL OF RODS AND PEDICLE SCREWS T5 AND T6, EXPLORATION OF FUSION, BIOPSY TRANSPEDICULAR T5 AND T6 (N/A )  09/14/2020   SPINAL FUSION N/A 02/12/2019   Procedure: DECOMPRESSIVE LAMINECTOMY THORACIC NINE-THORACIC TEN AND THORACIC TEN-THORACIC ELEVEN, EXTENSION OF THORACOLUMBAR FUSION FROM THORACIC TEN TO THORACIC FIVE, LOCAL BONE GRAFT, ALLOGRAFT AND VIVIGEN;  Surgeon: Jessy Oto, MD;  Location: [place];  Service: Orthopedics;  Laterality: N/A;  DECOMPRESSIVE LAMINECTOMY THORACIC NINE-THORACIC TEN AND THORACIC TEN-THORACIC ELEVEN, EXTENSION OF TH   SPINAL FUSION N/A 03/12/2019   Procedure: REVISION OF HARDWARE THORACIC  TEN-THORACIC ELEVEN WITH APPLICATION OF ADDITIONAL RODS AND ROD SLEEVES THORACIC EIGHT-THORACIC TWELVE 12-LUMBAR ONE;  Surgeon: Jessy Oto, MD;  Location: [place];  Service: Orthopedics;  Laterality: N/A;   TONSILLECTOMY     as child   torn rotator cuff  2010   right   VIDEO BRONCHOSCOPY N/A 11/17/2020   Procedure: VIDEO BRONCHOSCOPY WITH FLUORO;  Surgeon: Laurin Coder, MD;  Location: [facility];  Service: Endoscopy;  Laterality: N/A;    Family History  Problem Relation Age of Onset   Hypertension Mother    Diabetes Mother        type 2   Cancer Mother 2       breast in remission   Emphysema Father        smoker   COPD Father        smoker   Stroke Father 84       mini   Heart disease Father    Hypertension Sister    Hyperlipidemia Sister    Scoliosis Sister    Osteoporosis Sister    Hypertension Maternal Grandmother    Scoliosis Maternal Grandmother    Heart disease Maternal Grandfather    Heart disease Paternal Grandfather        smoker   Heart disease Daughter     Allergies  Allergen Reactions   Daptomycin     Drug induced pneumonia    Diltiazem Other (See Comments)    Passed out    Current Outpatient Medications on File Prior to Visit  Medication Sig Dispense Refill   albuterol (PROVENTIL HFA) 108 (90 Base) MCG/ACT inhaler TAKE 2 PUFFS BY MOUTH EVERY 6 HOURS AS NEEDED FOR WHEEZE OR SHORTNESS OF BREATH 8 g 5   atorvastatin (LIPITOR) 10 MG tablet TAKE 1 TABLET BY MOUTH EVERY DAY 90 tablet 1   DALVANCE 500 MG SOLR Inject 500 mg into the vein every 14 (fourteen) days. Pt states this is a every 2 week antibiotic. Could not state how long therapy should be.     dextromethorphan-guaiFENesin (MUCINEX DM) 30-600 MG 12hr tablet Take 1 tablet by mouth 2 (two) times daily.     digoxin (LANOXIN) 0.125 MG tablet TAKE 1 TABLET BY MOUTH EVERY DAY 90 tablet 1   famciclovir (FAMVIR) 500 MG tablet Take 1 tablet (500 mg total) by mouth 3 (three) times daily. (Patient  taking  differently: Take 500 mg by mouth 3 (three) times daily. As needed) 21 tablet 0   fluticasone (FLONASE) 50 MCG/ACT nasal spray SPRAY 2 SPRAYS INTO EACH NOSTRIL EVERY DAY 16 mL 1   Fluticasone-Umeclidin-Vilant (TRELEGY ELLIPTA) 200-62.5-25 MCG/ACT AEPB Inhale 1 puff into the lungs daily. 60 each 5   glucose blood (CONTOUR NEXT TEST) test strip Check blood sugar once daily 100 each 12   insulin glargine (LANTUS) 100 UNIT/ML Solostar Pen Inject 10 Units into the skin daily. 15 mL 0   Insulin Pen Needle (B-D UF III MINI PEN NEEDLES) 31G X 5 MM MISC USE WITH LANTUS 100 each 0   metFORMIN (GLUCOPHAGE) 1000 MG tablet TAKE 1 TABLET (1,000 MG TOTAL) BY MOUTH TWICE A DAY WITH FOOD 180 tablet 0   oxyCODONE-acetaminophen (PERCOCET/ROXICET) 5-325 MG tablet Take 1 tablet by mouth every 4 (four) hours as needed for moderate pain.     pantoprazole (PROTONIX) 40 MG tablet TAKE 1 TABLET BY MOUTH TWICE A DAY BEFORE A MEAL 180 tablet 1   predniSONE (DELTASONE) 10 MG tablet TAKE 1 TABLET (10 MG TOTAL) BY MOUTH DAILY WITH BREAKFAST. (Patient taking differently: Take 10 mg by mouth daily with breakfast. Taking 1/2 tablet daily) 30 tablet 3   pregabalin (LYRICA) 75 MG capsule TAKE 1 CAPSULE BY MOUTH TWICE A DAY (Patient taking differently: Take 75 mg by mouth 2 (two) times daily.) 60 capsule 3   sitaGLIPtin (JANUVIA) 50 MG tablet Take 1 tablet (50 mg total) by mouth daily. 30 tablet 3   STIOLTO RESPIMAT 2.5-2.5 MCG/ACT AERS Inhale 2 puffs into the lungs daily. 1 each 5   tamsulosin (FLOMAX) 0.4 MG CAPS capsule Take 2 capsules (0.8 mg total) by mouth daily. (Patient taking differently: Take 0.8 mg by mouth at bedtime.) 180 capsule 3   tiZANidine (ZANAFLEX) 4 MG tablet TAKE 1 TABLET BY MOUTH EVERY 6 HOURS AS NEEDED 360 tablet 1   venlafaxine XR (EFFEXOR-XR) 150 MG 24 hr capsule Take 1 capsule (150 mg total) by mouth daily with breakfast. 90 capsule 0   warfarin (COUMADIN) 5 MG tablet TAKE 1 TO 2 TABLETS DAILY OR AS PRESCRIBED  BY COUMADIN CLINIC 130 tablet 0   colchicine 0.6 MG tablet Take 1 tablet (0.6 mg total) by mouth 2 (two) times daily for 14 days. 28 tablet 0   zolpidem (AMBIEN) 10 MG tablet Take 0.5-1 tablets (5-10 mg total) by mouth at bedtime as needed for sleep. 15 tablet 1   Current Facility-Administered Medications on File Prior to Visit  Medication Dose Route Frequency Provider Last Rate Last Admin   testosterone cypionate (DEPOTESTOSTERONE CYPIONATE) injection 200 mg  200 mg Intramuscular Q14 Days Vannie Hilgert, Percell Miller, PA-C   200 mg at 07/06/21 0953    BP (!) 142/60   Pulse 76   Ht '5\' 10"'$  (1.778 m)   Wt 186 lb (84.4 kg)   SpO2 97%   BMI 26.69 kg/m        Objective:   Physical Exam  General Mental Status- Alert. General Appearance- Not in acute distress.   Skin General: Color- Normal Color. Moisture- Normal Moisture.  Neck Carotid Arteries- Normal color. Moisture- Normal Moisture. No carotid bruits. No JVD.  Chest and Lung Exam Auscultation: Breath Sounds:-Normal.  Cardiovascular Auscultation:Rythm- Regular. Murmurs & Other Heart Sounds:Auscultation of the heart reveals- No Murmurs.  Abdomen Inspection:-Inspeection Normal. Palpation/Percussion:Note:No mass. Palpation and Percussion of the abdomen reveal- Non Tender, Non Distended + BS, no rebound or guarding.   Neurologic  Cranial Nerve exam:- CN III-XII intact(No nystagmus), symmetric smile. Strength:- 5/5 equal and symmetric strength both upper and lower extremities.       Assessment & Plan:   Patient Instructions  Recent increased anxiety. Also  depression, insomnia  and stress. Worse anxiety.  Continue effexor 150 mg daily. Add on buspar 7.5 mg twice daily.  For insomnia continue ambien and will see if Dr. [name] willing to give 10 mg tab rx.  For chronic back pain continue oxycodone.  Lungs sound clear but mid shallow. Mild cough. If cough worsens, chills or sweats. Or chest congestion let me know will rx antibiotic  and get cxr.  Follow up with our office in one month or sooner if needed.   Mackie Pai, PA-C   Time spent with patient today was  34 minutes which consisted of chart review, discussing diagnosis, work up treatment and documentation.

## 2022-04-22 NOTE — Telephone Encounter (Signed)
Pt has an appt today , can be discussed at appt

## 2022-04-22 NOTE — Telephone Encounter (Signed)
Reached out to pt and explained to him that [place] did not accept the income docs he submitted. Discussed what documents they would prefer and he states he will look into it. He explained that he was hesitant to provide more specific documents as they did not accurately portray the current financial situation as his wife had not yet retired at that time. Advised that we could submit a letter explaining his financial situation if push came to shove.  Will await f/u.

## 2022-04-22 NOTE — Patient Instructions (Addendum)
Recent increased anxiety. Also  depression, insomnia  and stress. Worse anxiety.  Continue effexor 150 mg daily. Add on buspar 7.5 mg twice daily.  For insomnia continue ambien and will see if Dr. [name] willing to give 10 mg tab rx.  For chronic back pain continue oxycodone.  Lungs sound clear but mid shallow. Mild cough. If cough worsens, chills or sweats. Or chest congestion let me know will rx antibiotic and get cxr.  Follow up with our office in one month or sooner if needed.

## 2022-04-23 DIAGNOSIS — J9601 Acute respiratory failure with hypoxia: Secondary | ICD-10-CM | POA: Diagnosis not present

## 2022-04-23 DIAGNOSIS — R0602 Shortness of breath: Secondary | ICD-10-CM | POA: Diagnosis not present

## 2022-04-25 ENCOUNTER — Encounter: Payer: Self-pay | Admitting: Medical

## 2022-04-25 ENCOUNTER — Other Ambulatory Visit: Payer: Self-pay

## 2022-04-25 ENCOUNTER — Encounter: Payer: Self-pay | Admitting: Rehabilitative and Restorative Service Providers"

## 2022-04-25 ENCOUNTER — Ambulatory Visit: Payer: Medicare Other | Admitting: Rehabilitative and Restorative Service Providers"

## 2022-04-25 ENCOUNTER — Ambulatory Visit: Payer: Medicare Other | Admitting: Surgery

## 2022-04-25 ENCOUNTER — Encounter: Payer: Self-pay | Admitting: Surgery

## 2022-04-25 ENCOUNTER — Ambulatory Visit: Payer: Self-pay

## 2022-04-25 VITALS — BP 166/74 | HR 62 | Ht 70.0 in | Wt 182.0 lb

## 2022-04-25 DIAGNOSIS — M6281 Muscle weakness (generalized): Secondary | ICD-10-CM

## 2022-04-25 DIAGNOSIS — M5459 Other low back pain: Secondary | ICD-10-CM

## 2022-04-25 DIAGNOSIS — M546 Pain in thoracic spine: Secondary | ICD-10-CM | POA: Diagnosis not present

## 2022-04-25 DIAGNOSIS — R293 Abnormal posture: Secondary | ICD-10-CM

## 2022-04-25 DIAGNOSIS — M4325 Fusion of spine, thoracolumbar region: Secondary | ICD-10-CM

## 2022-04-25 DIAGNOSIS — M549 Dorsalgia, unspecified: Secondary | ICD-10-CM | POA: Diagnosis not present

## 2022-04-25 DIAGNOSIS — M62830 Muscle spasm of back: Secondary | ICD-10-CM

## 2022-04-25 NOTE — Progress Notes (Signed)
Office Visit Note   Patient: Luis Brown           Date of Birth: 10/27/1945           MRN: [id number] Visit Date: 04/25/2022              Requested by: Mackie Pai, PA-C [place],  [place] [address] PCP: [name], MD   Assessment & Plan: Visit Diagnoses:  1. Mid back pain   2. Fusion of spine of thoracolumbar region   3. Spasm of thoracic back muscle     Plan: Patient's spasm in the right thoracic paraspinal musculature patient was sent to PT today for evaluation and see if dry needling would be helpful.  He will follow-up with Dr. Louanne Skye in 2 weeks for recheck.  Dr. Nils Pyle wanted blood work drawn to check a CBC with differential, sed rate and CRP and this was done.  Follow-Up Instructions: Return in about 2 weeks (around 05/09/2022) for with dr Louanne Skye for recheck.   Orders:  Orders Placed This Encounter  Procedures   XR Thoracic Spine 2 View   CBC With Differential   Sed Rate (ESR)   C-reactive protein   Ambulatory referral to Physical Therapy   No orders of the defined types were placed in this encounter.     Procedures: No procedures performed   Clinical Data: No additional findings.   Subjective: Chief Complaint  Patient presents with   Middle Back - Pain    HPI 76 year old male returns with complaints of right thoracic paraspinal spasm.  Patient's history is well-documented in chart and he has had extensive fusion of his thoracic and lumbar spine.  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  States that problem area may have been aggravated after he has been push mowing.  Chronic pain management and follow-up with Dr. Hardin Negus for this.    Objective: Vital Signs: BP (!) 166/74   Pulse 62   Ht $R'5\' 10"'wu$  (1.778 m)   Wt 182 lb (82.6 kg)   BMI 26.11 kg/m   Physical Exam Very pleasant male alert and oriented in no acute stress.  He does have some right-sided thoracic paraspinal  tenderness/spasm.  He is neurologically intact. Ortho Exam  Specialty Comments:  No specialty comments available.  Imaging: No results found.   PMFS History: Patient Active Problem List   Diagnosis Date Noted   Secondary hypercoagulable state ([place]) 03/16/2022   IPF (idiopathic pulmonary fibrosis) ([place]) 12/30/2021   Atrial fibrillation ([place]) 09/10/2021   Long term (current) use of anticoagulants 09/10/2021   Diabetes mellitus due to underlying condition with hyperosmolarity without coma, with long-term current use of insulin ([place]) 09/10/2021   Mixed hyperlipidemia 09/10/2021   Aspiration pneumonitis ([place]) 08/07/2021   Chronic respiratory failure with hypoxia ([place]) 08/07/2021   Pulmonary emboli ([place]) 07/16/2021   Bronchiectasis with acute exacerbation ([place]) 07/16/2021   History of DVT (deep vein thrombosis) 07/15/2021   Paroxysmal A-fib ([place]) 07/15/2021   COPD (chronic obstructive pulmonary disease) ([place]) 02/10/2021   Palpitations 02/01/2021   Wears glasses 01/11/2021   Pneumothorax, right 12/15/2020   Anxiety 12/01/2020   SOB (shortness of breath)    Nocturnal hypoxia 11/14/2020   S/P bronchoscopy 09/14/2020   Gastric ulcer 01/27/2020   H/O colonoscopy with polypectomy 01/27/2020   Protein-calorie malnutrition, severe 01/09/2020   Abdominal pain 12/30/2019   Weight loss, abnormal 12/30/2019   Change in bowel habits  12/30/2019   Medication monitoring encounter 08/26/2019   Peripheral neuropathy 08/07/2019   Hyponatremia 05/05/2019   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[place]) 04/04/2019   Fusion of spine of thoracolumbar region 03/12/2019   Status post thoracic spinal fusion 02/12/2019   Thoracic spinal stenosis 04/17/2017   Painful orthopaedic hardware ([place]) 04/17/2017   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 ([place]) 04/17/2017   Low back pain radiating to both legs 01/16/2017   Esophageal reflux 02/10/2015   Essential  hypertension 06/25/2014   Asthma 06/25/2014   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   Overweight 06/25/2014   Other abnormal glucose 06/25/2014   Spinal stenosis of lumbar region without neurogenic claudication 04/09/2014   Flatback syndrome 04/09/2014   Chicken pox 10/21/2013   History of colonic polyps 10/27/2012   Testosterone deficiency 05/23/2012   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   Benign enlargement of prostate 04/23/2012   ED (erectile dysfunction) 04/23/2012   Allergic state 10/25/2011   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   Esophageal reflux 02/10/2015   Fatigue    HTN (hypertension)    Hyperglycemia    preDM    Hyperlipidemia    Insomnia    Low back pain radiating to both legs 01/16/2017   Measles as a child   Overweight(278.02)    Peripheral neuropathy 08/07/2019   Personal history of colonic polyps 10/27/2012   Follows with [facility]   Pneumonia    " walking"   Preventative health care    Testosterone deficiency 05/23/2012   Wears glasses     Family History  Problem Relation Age of Onset   Hypertension Mother    Diabetes Mother        type 2   Cancer Mother 58       breast in remission   Emphysema Father        smoker   COPD Father        smoker   Stroke Father 69        mini   Heart disease Father    Hypertension Sister    Hyperlipidemia Sister    Scoliosis Sister    Osteoporosis Sister    Hypertension Maternal Grandmother    Scoliosis Maternal Grandmother    Heart disease Maternal Grandfather    Heart disease Paternal Grandfather        smoker   Heart disease Daughter     Past Surgical History:  Procedure Laterality Date   ATRIAL FIBRILLATION ABLATION N/A 01/27/2022   Procedure: ATRIAL FIBRILLATION ABLATION;  Surgeon: Constance Haw, MD;  Location: [facility];  Service: Cardiovascular;  Laterality: N/A;   BACK SURGERY  2012 and 1994   Dr Margret Chance,  screws and cage in low back   BIOPSY  01/09/2020   Procedure: BIOPSY;  Surgeon: Otis Brace, MD;  Location: [facility];  Service: Gastroenterology;;   BRONCHIAL BIOPSY  11/17/2020   Procedure: BRONCHIAL BIOPSIES;  Surgeon: Laurin Coder, MD;  Location: [facility];  Service: Endoscopy;;   BRONCHIAL BRUSHINGS  11/17/2020   Procedure: BRONCHIAL BRUSHINGS;  Surgeon: Laurin Coder, MD;  Location: [facility];  Service: Endoscopy;;   BRONCHIAL WASHINGS  11/17/2020   Procedure: BRONCHIAL WASHINGS;  Surgeon: Laurin Coder, MD;  Location: [facility];  Service: Endoscopy;;   COLONOSCOPY WITH PROPOFOL N/A 01/09/2020   Procedure: COLONOSCOPY WITH PROPOFOL;  Surgeon: Otis Brace, MD;  Location: [facility];  Service: Gastroenterology;  Laterality: N/A;   ESOPHAGOGASTRODUODENOSCOPY (EGD) WITH PROPOFOL N/A 01/09/2020   Procedure: ESOPHAGOGASTRODUODENOSCOPY (EGD) WITH PROPOFOL;  Surgeon: Otis Brace, MD;  Location: [facility];  Service: Gastroenterology;  Laterality: N/A;   EYE SURGERY Bilateral    2016   HARDWARE REVISION  03/12/2019   REVISION OF HARDWARE THORACIC TEN-THORACIC ELEVEN WITH APPLICATION OF ADDITIONAL RODS AND ROD SLEEVES THORACIC EIGHT-THORACIC TWELVE 12-LUMBAR ONE (N/A )   HEMOSTASIS CONTROL  11/17/2020   Procedure: HEMOSTASIS CONTROL;  Surgeon:  Laurin Coder, MD;  Location: [facility];  Service: Endoscopy;;   IR US GUIDE BX ASP/DRAIN  11/13/2020   LAMINECTOMY  02/12/2019   DECOMPRESSIVE LAMINECTOMY THORACIC NINE-THORACIC TEN AND THORACIC TEN-THORACIC ELEVEN, EXTENSION OF THORACOLUMBAR FUSION FROM THORACIC TEN TO THORACIC FIVE, LOCAL BONE GRAFT, ALLOGRAFT AND VIVIGEN (N/A)   POLYPECTOMY  01/09/2020   Procedure: POLYPECTOMY;  Surgeon: Otis Brace, MD;  Location: [facility];  Service: Gastroenterology;;   REMOVAL OF RODS AND PEDICLE SCREWS T5 AND T6, EXPLORATION OF FUSION, BIOPSY TRANSPEDICULAR T5 AND T6 (N/A )  09/14/2020   SPINAL FUSION N/A 02/12/2019   Procedure: DECOMPRESSIVE LAMINECTOMY THORACIC NINE-THORACIC TEN AND THORACIC TEN-THORACIC ELEVEN, EXTENSION OF THORACOLUMBAR FUSION FROM THORACIC TEN TO THORACIC FIVE, LOCAL BONE GRAFT, ALLOGRAFT AND VIVIGEN;  Surgeon: Jessy Oto, MD;  Location: [facility];  Service: Orthopedics;  Laterality: N/A;  DECOMPRESSIVE LAMINECTOMY THORACIC NINE-THORACIC TEN AND THORACIC TEN-THORACIC ELEVEN, EXTENSION OF TH   SPINAL FUSION N/A 03/12/2019   Procedure: REVISION OF HARDWARE THORACIC TEN-THORACIC ELEVEN WITH APPLICATION OF ADDITIONAL RODS AND ROD SLEEVES THORACIC EIGHT-THORACIC TWELVE 12-LUMBAR ONE;  Surgeon: Jessy Oto, MD;  Location: [facility];  Service: Orthopedics;  Laterality: N/A;   TONSILLECTOMY     as child   torn rotator cuff  2010   right   VIDEO BRONCHOSCOPY N/A 11/17/2020   Procedure: VIDEO BRONCHOSCOPY WITH FLUORO;  Surgeon: Laurin Coder, MD;  Location: [facility];  Service: Endoscopy;  Laterality: N/A;   Social History   Occupational History   Not on file  Tobacco Use   Smoking status: Former    Packs/day: 1.00    Years: 20.00    Total pack years: 20.00    Types: Cigarettes    Quit date: 07/18/1989    Years since quitting: 32.7   Smokeless tobacco: Never   Tobacco comments:    Former smoker 03/16/22  Vaping Use   Vaping Use: Never used  Substance and Sexual  Activity   Alcohol use: Yes    Alcohol/week: 2.0 standard drinks of alcohol    Types: 2 Standard drinks or equivalent per week    Comment: couple beers here and there 03/16/22   Drug use: No   Sexual activity: Yes    Comment: lives with wife, still  working, no dietary restrictions, continues to exercise intermittently

## 2022-04-25 NOTE — Therapy (Signed)
OUTPATIENT PHYSICAL THERAPY EVALUATION   Patient Name: KANTON KAMEL MRN: [id number] DOB:Mar 20, 1946, 76 y.o., male Today's Date: 04/25/2022  END OF SESSION:   PT End of Session - 04/25/22 0930     Visit Number 1    Number of Visits 20    Date for PT Re-Evaluation 07/04/22    Authorization Type [employer] Medicare $20 copay    Progress Note Due on Visit 10    PT Start Time 0932    PT Stop Time 1010    PT Time Calculation (min) 38 min    Activity Tolerance Patient tolerated treatment well    Behavior During Therapy [facility] for tasks assessed/performed             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   Esophageal reflux 02/10/2015   Fatigue    HTN (hypertension)    Hyperglycemia    preDM    Hyperlipidemia    Insomnia    Low back pain radiating to both legs 01/16/2017   Measles as a child   Overweight(278.02)    Peripheral neuropathy 08/07/2019   Personal history of colonic polyps 10/27/2012   Follows with [facility]   Pneumonia    " walking"   Preventative health care    Testosterone deficiency 05/23/2012   Wears glasses    Past Surgical History:  Procedure Laterality Date   ATRIAL FIBRILLATION ABLATION N/A 01/27/2022   Procedure: ATRIAL FIBRILLATION ABLATION;  Surgeon: Constance Haw, MD;  Location: [place] [facility];  Service: Cardiovascular;  Laterality: N/A;   BACK SURGERY  2012 and 1994   Dr Margret Chance, screws and cage in low back   BIOPSY  01/09/2020   Procedure: BIOPSY;  Surgeon: Otis Brace, MD;   Location: [facility];  Service: Gastroenterology;;   BRONCHIAL BIOPSY  11/17/2020   Procedure: BRONCHIAL BIOPSIES;  Surgeon: Laurin Coder, MD;  Location: [facility];  Service: Endoscopy;;   BRONCHIAL BRUSHINGS  11/17/2020   Procedure: BRONCHIAL BRUSHINGS;  Surgeon: Laurin Coder, MD;  Location: [facility];  Service: Endoscopy;;   BRONCHIAL WASHINGS  11/17/2020   Procedure: BRONCHIAL WASHINGS;  Surgeon: Laurin Coder, MD;  Location: [facility];  Service: Endoscopy;;   COLONOSCOPY WITH PROPOFOL N/A 01/09/2020   Procedure: COLONOSCOPY WITH PROPOFOL;  Surgeon: Otis Brace, MD;  Location: [facility];  Service: Gastroenterology;  Laterality: N/A;   ESOPHAGOGASTRODUODENOSCOPY (EGD) WITH PROPOFOL N/A 01/09/2020   Procedure: ESOPHAGOGASTRODUODENOSCOPY (EGD) WITH PROPOFOL;  Surgeon: Otis Brace, MD;  [facility];  Service: Gastroenterology;  Laterality: N/A;   EYE SURGERY Bilateral    2016   HARDWARE REVISION  03/12/2019   REVISION OF HARDWARE THORACIC TEN-THORACIC ELEVEN WITH APPLICATION OF ADDITIONAL RODS AND ROD SLEEVES THORACIC EIGHT-THORACIC TWELVE 12-LUMBAR ONE (N/A )   HEMOSTASIS CONTROL  11/17/2020   Procedure: HEMOSTASIS CONTROL;  Surgeon: Laurin Coder, MD;  Location: [facility];  Service: Endoscopy;;   IR US GUIDE BX ASP/DRAIN  11/13/2020   LAMINECTOMY  02/12/2019   DECOMPRESSIVE LAMINECTOMY THORACIC NINE-THORACIC TEN AND THORACIC TEN-THORACIC ELEVEN, EXTENSION OF THORACOLUMBAR FUSION FROM THORACIC TEN TO THORACIC FIVE, LOCAL BONE GRAFT, ALLOGRAFT AND VIVIGEN (N/A)   POLYPECTOMY  01/09/2020   Procedure: POLYPECTOMY;  Surgeon: Otis Brace, MD;  Location: [facility];  Service: Gastroenterology;;   REMOVAL OF RODS AND PEDICLE SCREWS T5 AND T6, EXPLORATION OF FUSION, BIOPSY TRANSPEDICULAR T5 AND T6 (N/A )  09/14/2020   SPINAL FUSION N/A 02/12/2019   Procedure: DECOMPRESSIVE LAMINECTOMY THORACIC NINE-THORACIC TEN AND THORACIC TEN-THORACIC ELEVEN,  EXTENSION OF THORACOLUMBAR FUSION FROM THORACIC TEN TO THORACIC FIVE, LOCAL BONE GRAFT, ALLOGRAFT AND VIVIGEN;  Surgeon: Jessy Oto, MD;  Location: [place];  Service: Orthopedics;  Laterality: N/A;  DECOMPRESSIVE LAMINECTOMY THORACIC NINE-THORACIC TEN AND THORACIC TEN-THORACIC ELEVEN, EXTENSION OF TH   SPINAL FUSION N/A 03/12/2019   Procedure: REVISION OF HARDWARE THORACIC TEN-THORACIC ELEVEN WITH APPLICATION OF ADDITIONAL RODS AND ROD SLEEVES THORACIC EIGHT-THORACIC TWELVE 12-LUMBAR ONE;  Surgeon: Jessy Oto, MD;  Location: [place];  Service: Orthopedics;  Laterality: N/A;   TONSILLECTOMY     as child   torn rotator cuff  2010   right   VIDEO BRONCHOSCOPY N/A 11/17/2020   Procedure: VIDEO BRONCHOSCOPY WITH FLUORO;  Surgeon: Laurin Coder, MD;  Location: [facility];  Service: Endoscopy;  Laterality: N/A;   Patient Active Problem List   Diagnosis Date Noted   Secondary hypercoagulable state ([place]) 03/16/2022   IPF (idiopathic pulmonary fibrosis) ([place]) 12/30/2021   Atrial fibrillation ([place]) 09/10/2021   Long term (current) use of anticoagulants 09/10/2021   Diabetes mellitus due to underlying condition with hyperosmolarity without coma, with long-term current use of insulin ([place]) 09/10/2021   Mixed hyperlipidemia 09/10/2021   Aspiration pneumonitis ([place]) 08/07/2021   Chronic respiratory failure with hypoxia ([place]) 08/07/2021   Pulmonary emboli ([place]) 07/16/2021   Bronchiectasis with acute exacerbation ([place]) 07/16/2021   History of DVT (deep vein thrombosis) 07/15/2021   Paroxysmal A-fib ([place]) 07/15/2021   COPD (chronic obstructive pulmonary disease) ([place]) 02/10/2021   Palpitations 02/01/2021   Wears glasses 01/11/2021   Pneumothorax, right 12/15/2020   Anxiety 12/01/2020   SOB (shortness of breath)    Nocturnal hypoxia 11/14/2020   S/P bronchoscopy 09/14/2020   Gastric ulcer 01/27/2020   H/O colonoscopy with polypectomy 01/27/2020   Protein-calorie malnutrition, severe 01/09/2020    Abdominal pain 12/30/2019   Weight loss, abnormal 12/30/2019   Change in bowel habits 12/30/2019   Medication monitoring encounter 08/26/2019   Peripheral neuropathy 08/07/2019   Hyponatremia 05/05/2019   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[place]) 04/04/2019   Fusion of spine of thoracolumbar region 03/12/2019   Status post thoracic spinal fusion 02/12/2019   Thoracic spinal stenosis 04/17/2017   Painful orthopaedic hardware ([place]) 04/17/2017   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 ([place]) 04/17/2017   Low back pain radiating to both legs 01/16/2017   Esophageal reflux 02/10/2015   Essential hypertension 06/25/2014   Asthma 06/25/2014   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   Overweight 06/25/2014   Other abnormal glucose 06/25/2014   Spinal stenosis of lumbar region without neurogenic claudication 04/09/2014   Flatback syndrome 04/09/2014   Chicken pox 10/21/2013   History of colonic polyps 10/27/2012   Testosterone deficiency 05/23/2012   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   Benign enlargement of prostate 04/23/2012   ED (erectile dysfunction) 04/23/2012   Allergic state 10/25/2011   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   Diabetes mellitus with hyperglycemia ([place])  Insomnia    Fatigue    Preventative health care    DDD (degenerative disc disease), lumbosacral    Thoracic degenerative disc disease     PCP: [name] MD  REFERRING PROVIDER: Lanae Crumbly, PA-C  REFERRING DIAG: [id number] (ICD-10-CM) - Fusion of spine of thoracolumbar region M62.830 (ICD-10-CM) - Spasm of thoracic back muscle  Rationale for Evaluation and Treatment Rehabilitation  THERAPY DIAG:  Other low back pain  Pain in thoracic spine  Abnormal posture  Muscle weakness (generalized)  ONSET DATE: 03/2022  SUBJECTIVE:                                                                                                                                                                                            SUBJECTIVE STATEMENT: Pt indicated long history of back complaints resulting in surgery history.  Pt indicated having fairly constant pain in back and has taken medication for pain symptoms.  Pt. Indicated worsened symptoms in last 4-5 weeks insidiously.  Reported pain in middle of back/low back primarily on Rt side.  Pt indicated trouble sleeping but not necessarily due to pain.    PERTINENT HISTORY:  DDD, DM, HTN, hyperglycemia, hyperlipidemia, Peripherial neuropathy, presence of thoraco-lumbar fusions.   PAIN:  NPRS scale: current 6/10, at worst 8/10, at best 0/10 Pain location: mid and low back  Pain description: constant, sharp at times Aggravating factors: lifting/carrying, bending, sometimes yard work activity, prolonged sitting Relieving factors: medicine, trying to stay activity   PRECAUTIONS: Other: thoracolumbar fusion  WEIGHT BEARING RESTRICTIONS No  FALLS:  Has patient fallen in last 6 months? No  LIVING ENVIRONMENT: Lives with: lives with their spouse Lives in: House/apartment Stairs: 2 stairs  OCCUPATION: Retired   PLOF: Independent, yard work (mowing back yard), house activity, gardening activity   PATIENT GOALS    Less pain   OBJECTIVE:   PATIENT SURVEYS:  04/25/2022 FOTO intake: 53   predicted:  74  SCREENING FOR RED FLAGS: 04/25/2022 Bowel or bladder incontinence: No Cauda equina syndrome: No  COGNITION: 04/25/2022  Overall cognitive status: WFL normal    SENSATION: 04/25/2022 Not specifically tested today  MUSCLE LENGTH: 04/25/2022 No specifically tested today  POSTURE:  04/25/2022 Forward head posture, reduced lumbar lordosis, mild increase in thoracic kyphosis above top   PALPATION: 04/25/2022 Mild tenderness in Rt thoracic paraspinals mid thoracic region.   LUMBAR ROM:    AROM  04/25/2022  Flexion Hands to patella c mild pull in back  Extension 50% WFL -  no complaints  Right lateral flexion   Left lateral flexion      Thoracic Right rotation  50% [facility]  Thoracic Left rotation 50 % WFL   (Blank rows = not tested)  LOWER EXTREMITY ROM:      Right 04/25/2022 Left 04/25/2022  Hip flexion    Hip extension    Hip abduction    Hip adduction    Hip internal rotation    Hip external rotation    Knee flexion    Knee extension    Ankle dorsiflexion    Ankle plantarflexion    Ankle inversion    Ankle eversion     (Blank rows = not tested)  LOWER EXTREMITY MMT:    MMT Right 04/25/2022 Left 04/25/2022  Hip flexion 4/5 4/5  Hip extension    Hip abduction    Hip adduction    Hip internal rotation    Hip external rotation    Knee flexion 5/5 5/5  Knee extension 5/5 5/5  Ankle dorsiflexion 5/5 5/5  Ankle plantarflexion    Ankle inversion    Ankle eversion     (Blank rows = not tested)  LUMBAR SPECIAL TESTS:  04/25/2022 (-) slump bilateral  FUNCTIONAL TESTS:  04/25/2022 18 inch chair s UE assist , bracing legs on chair Lt and Rt SLS - attempted but unable to maintain.    GAIT: 04/25/2022 Independent ambulation, antalgic gait c variable step lengths bilateral with increased lateral sway noted bilaterally.     TODAY'S TREATMENT  04/25/2022 Therex:    HEP instruction/performance c cues for techniques, handout provided.  Trial set performed of each for comprehension and symptom assessment.  See below for exercise list  Manual: Skilled palpation to muscles during dry needling.  STM to stame  Dry Needling: Handout provided.  Verbal consent given regarding use.  Twitch response mild in mid thoracic superficial paraspinals.  No adverse reaction noted.  Instructions given on aftercare per handout instructions.      PATIENT EDUCATION:  04/25/2022 Education details: HEP, POC Person educated: Patient Education method: Consulting civil engineer, Media planner, Verbal cues, and Handouts Education comprehension: verbalized understanding, returned  demonstration, and verbal cues required   HOME EXERCISE PROGRAM: Access Code: [facility] URL: [email or web address] Date: 04/25/2022 Prepared by: Scot Jun  Exercises - Shoulder External Rotation and Scapular Retraction  - 3-5 x daily - 7 x weekly - 1 sets - 5 reps - 2 hold - Standing Shoulder Row with Anchored Resistance  - 1-2 x daily - 7 x weekly - 1-2 sets - 10-15 reps - Shoulder Extension with Resistance  - 1-2 x daily - 7 x weekly - 1-2 sets - 10-15 reps - Sit to Stand  - 3 x daily - 7 x weekly - 1 sets - 5-10 reps  ASSESSMENT:  CLINICAL IMPRESSION: Patient is a 76 y.o. who comes to clinic with complaints of thoracolumbar pain with mobility, strength and movement coordination deficits that impair their ability to perform usual daily and recreational functional activities without increase difficulty/symptoms at this time.  Patient to benefit from skilled PT services to address impairments and limitations to improve to previous level of function without restriction secondary to condition.    OBJECTIVE IMPAIRMENTS Abnormal gait, decreased activity tolerance, decreased balance, decreased coordination, decreased endurance, decreased mobility, difficulty walking, decreased ROM, decreased strength, increased fascial restrictions, impaired perceived functional ability, increased muscle spasms, impaired flexibility, improper body mechanics, postural dysfunction, and pain.   ACTIVITY LIMITATIONS carrying, lifting, bending, sitting, transfers, and locomotion level  PARTICIPATION LIMITATIONS: meal prep, cleaning, community activity, and yard work  [place]  DDD, DM, HTN, hyperglycemia, hyperlipidemia,  Peripherial neuropathy, presence of thoraco-lumbar fusions.   are also affecting patient's functional outcome.   REHAB POTENTIAL: Fair to good  CLINICAL DECISION MAKING: Evolving/moderate complexity  EVALUATION COMPLEXITY: Moderate   GOALS: Goals reviewed with  patient? Yes  Short term PT Goals (target date for Short term goals are 3 weeks 05/16/2022) Patient will demonstrate independent use of home exercise program to maintain progress from in clinic treatments. Goal status: New   Long term PT goals (target dates for all long term goals are 10 weeks  07/04/2022 )   1. Patient will demonstrate/report pain at worst less than or equal to 2/10 to facilitate minimal limitation in daily activity secondary to pain symptoms. Goal status: New   2. Patient will demonstrate independent use of home exercise program to facilitate ability to maintain/progress functional gains from skilled physical therapy services. Goal status: New   3. Patient will demonstrate FOTO outcome > or = 53 % to indicate reduced disability due to condition. Goal status: New   4. Patient will demonstrate lumbar extension 75  % or greater WFL s symptoms to facilitate upright standing, walking posture at PLOF s limitation. Goal status: New   5.  Patient will demonstrate/report ability to perform usual daily house/yard activity s restriction due to symptoms.    Goal status: New   6.  Patient will demonstrate bilateral SLS > 5 seconds to improve stability in ambulation.   Goal status: New       PLAN: PT FREQUENCY: 1-2x/week  PT DURATION: 10 weeks  PLANNED INTERVENTIONS: Therapeutic exercises, Therapeutic activity, Neuro Muscular re-education, Balance training, Gait training, Patient/Family education, Joint mobilization, Stair training, DME instructions, Dry Needling, Electrical stimulation, Cryotherapy, Moist heat, Taping, Traction Ultrasound, Ionotophoresis '4mg'$ /ml Dexamethasone, and Manual therapy.  All included unless contraindicated   PLAN FOR NEXT SESSION: Review HEP knowledge/results.   Postural strengthening activity.  Dry needling as desired/appropriate.    Scot Jun, PT, DPT, OCS, ATC 04/25/22  10:54 AM

## 2022-04-26 LAB — SEDIMENTATION RATE: Sed Rate: 2 mm/h (ref 0–20)

## 2022-04-26 LAB — C-REACTIVE PROTEIN: CRP: 4 mg/L (ref <8.0)

## 2022-04-26 MED ORDER — ZOLPIDEM TARTRATE 10 MG PO TABS: 5.0000 mg | ORAL_TABLET | Freq: Every evening | ORAL | 1 refills | Status: DC | PRN

## 2022-04-26 NOTE — Telephone Encounter (Signed)
Requesting: xanax Contract:  UDS: Last Visit:04/22/22 Next Visit:06/15/22 Last Refill:03/22/22  Please Advise

## 2022-04-27 ENCOUNTER — Other Ambulatory Visit: Payer: Self-pay | Admitting: Cardiology

## 2022-04-27 ENCOUNTER — Encounter: Payer: Self-pay | Admitting: Cardiology

## 2022-04-27 ENCOUNTER — Ambulatory Visit: Payer: Medicare Other | Attending: Cardiology | Admitting: Cardiology

## 2022-04-27 VITALS — BP 118/64 | HR 58 | Ht 70.0 in | Wt 186.8 lb

## 2022-04-27 DIAGNOSIS — I10 Essential (primary) hypertension: Secondary | ICD-10-CM | POA: Diagnosis not present

## 2022-04-27 DIAGNOSIS — I480 Paroxysmal atrial fibrillation: Secondary | ICD-10-CM

## 2022-04-27 DIAGNOSIS — D6869 Other thrombophilia: Secondary | ICD-10-CM

## 2022-04-27 LAB — SPECIMEN STATUS REPORT

## 2022-04-27 MED ORDER — WARFARIN SODIUM 5 MG PO TABS: ORAL_TABLET | ORAL | 1 refills | Status: DC

## 2022-04-27 NOTE — Telephone Encounter (Signed)
Refill request for warfarin:  Last INR was 3.2 on 04/06/22 Next INR due on 05/04/22 LOV was 04/27/22  Elliot Cousin MD  Refill approved.

## 2022-04-27 NOTE — Progress Notes (Signed)
Electrophysiology Office Note   Date:  04/27/2022   ID:  Luis Brown, DOB 1946/02/11, MRN [id number]  PCP:  [name], MD  Cardiologist:  [place] Primary Electrophysiologist:  Marilyne Haseley Meredith Leeds, MD    Chief Complaint: AF   History of Present Illness: Luis Brown is a 76 y.o. male who is being seen today for the evaluation of AF at the request of Saguier, Percell Miller, [place]. Presenting today for electrophysiology evaluation.  He has a history significant for atrial fibrillation, hypertension, diabetes, hyperlipidemia.  He has had multiple episodes of atrial fibrillation with rapid rates.  He feels quite poorly when he is in atrial fibrillation.  He wore a Zio monitor which showed a 3% burden.  He is now status post atrial fibrillation ablation 01/27/2022.  Today, denies symptoms of palpitations, chest pain, shortness of breath, orthopnea, PND, lower extremity edema, claudication, dizziness, presyncope, syncope, bleeding, or neurologic sequela. The patient is tolerating medications without difficulties.  Since his ablation he has done well.  He has had no further episodes of atrial fibrillation to his knowledge.  He is able to do all of his daily activities without restriction.  He is overall quite happy with how he has been feeling.   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   Esophageal reflux 02/10/2015   Fatigue    HTN (hypertension)    Hyperglycemia    preDM    Hyperlipidemia    Insomnia    Low back pain radiating to both legs  01/16/2017   Measles as a child   Overweight(278.02)    Peripheral neuropathy 08/07/2019   Personal history of colonic polyps 10/27/2012   Follows with [facility]   Pneumonia    " walking"   Preventative health care    Testosterone deficiency 05/23/2012   Wears glasses    Past Surgical History:  Procedure Laterality Date   ATRIAL FIBRILLATION ABLATION N/A 01/27/2022   Procedure: ATRIAL FIBRILLATION ABLATION;  Surgeon: Constance Haw, MD;  Location: [facility];  Service: Cardiovascular;  Laterality: N/A;   BACK SURGERY  2012 and 1994   Dr Margret Chance, screws and cage in low back   BIOPSY  01/09/2020   Procedure: BIOPSY;  Surgeon: Otis Brace, MD;  Location: [facility];  Service: Gastroenterology;;   BRONCHIAL BIOPSY  11/17/2020   Procedure: BRONCHIAL BIOPSIES;  Surgeon: Laurin Coder, MD;  Location: [facility];  Service: Endoscopy;;   BRONCHIAL BRUSHINGS  11/17/2020   Procedure: BRONCHIAL BRUSHINGS;  Surgeon: Laurin Coder, MD;  Location: [facility];  Service: Endoscopy;;   BRONCHIAL WASHINGS  11/17/2020   Procedure: BRONCHIAL WASHINGS;  Surgeon: Laurin Coder, MD;  Location: [facility];  Service: Endoscopy;;   COLONOSCOPY WITH PROPOFOL N/A 01/09/2020   Procedure: COLONOSCOPY WITH PROPOFOL;  Surgeon: Otis Brace, MD;  Location: [facility];  Service: Gastroenterology;  Laterality: N/A;   ESOPHAGOGASTRODUODENOSCOPY (EGD) WITH PROPOFOL N/A 01/09/2020   Procedure: ESOPHAGOGASTRODUODENOSCOPY (EGD) WITH PROPOFOL;  Surgeon:  Otis Brace, MD;  Location: [facility];  Service: Gastroenterology;  Laterality: N/A;   EYE SURGERY Bilateral    2016   HARDWARE REVISION  03/12/2019   REVISION OF HARDWARE THORACIC TEN-THORACIC ELEVEN WITH APPLICATION OF ADDITIONAL RODS AND ROD SLEEVES THORACIC EIGHT-THORACIC TWELVE 12-LUMBAR ONE (N/A )   HEMOSTASIS CONTROL  11/17/2020   Procedure: HEMOSTASIS CONTROL;  Surgeon: Laurin Coder, MD;  Location: [facility];  Service: Endoscopy;;   IR US GUIDE BX ASP/DRAIN  11/13/2020   LAMINECTOMY  02/12/2019   DECOMPRESSIVE LAMINECTOMY THORACIC NINE-THORACIC TEN AND THORACIC TEN-THORACIC ELEVEN, EXTENSION OF THORACOLUMBAR FUSION FROM THORACIC TEN TO THORACIC FIVE, LOCAL BONE GRAFT, ALLOGRAFT AND VIVIGEN (N/A)   POLYPECTOMY  01/09/2020   Procedure: POLYPECTOMY;  Surgeon: Otis Brace, MD;  Location: [facility];  Service: Gastroenterology;;   REMOVAL OF RODS AND PEDICLE SCREWS T5 AND T6, EXPLORATION OF FUSION, BIOPSY TRANSPEDICULAR T5 AND T6 (N/A )  09/14/2020   SPINAL FUSION N/A 02/12/2019   Procedure: DECOMPRESSIVE LAMINECTOMY THORACIC NINE-THORACIC TEN AND THORACIC TEN-THORACIC ELEVEN, EXTENSION OF THORACOLUMBAR FUSION FROM THORACIC TEN TO THORACIC FIVE, LOCAL BONE GRAFT, ALLOGRAFT AND VIVIGEN;  Surgeon: Jessy Oto, MD;  Location: [facility];  Service: Orthopedics;  Laterality: N/A;  DECOMPRESSIVE LAMINECTOMY THORACIC NINE-THORACIC TEN AND THORACIC TEN-THORACIC ELEVEN, EXTENSION OF TH   SPINAL FUSION N/A 03/12/2019   Procedure: REVISION OF HARDWARE THORACIC TEN-THORACIC ELEVEN WITH APPLICATION OF ADDITIONAL RODS AND ROD SLEEVES THORACIC EIGHT-THORACIC TWELVE 12-LUMBAR ONE;  Surgeon: Jessy Oto, MD;  Location: [facility];  Service: Orthopedics;  Laterality: N/A;   TONSILLECTOMY     as child   torn rotator cuff  2010   right   VIDEO BRONCHOSCOPY N/A 11/17/2020   Procedure: VIDEO BRONCHOSCOPY WITH FLUORO;  Surgeon: Laurin Coder, MD;  Location: [facility];  Service: Endoscopy;  Laterality: N/A;     Current Outpatient Medications  Medication Sig Dispense Refill   albuterol (PROVENTIL HFA) 108 (90 Base) MCG/ACT inhaler TAKE 2 PUFFS BY MOUTH EVERY 6 HOURS AS NEEDED FOR WHEEZE OR SHORTNESS OF BREATH 8 g 5   atorvastatin (LIPITOR) 10 MG tablet TAKE 1 TABLET BY MOUTH EVERY DAY 90 tablet 1   busPIRone (BUSPAR) 7.5 MG tablet Take 1 tablet (7.5 mg total) by mouth 2 (two) times daily. 60 tablet 0    colchicine 0.6 MG tablet Take 1 tablet (0.6 mg total) by mouth 2 (two) times daily for 14 days. 28 tablet 0   DALVANCE 500 MG SOLR Inject 500 mg into the vein every 14 (fourteen) days. Pt states this is a every 2 week antibiotic. Could not state how long therapy should be.     dextromethorphan-guaiFENesin (MUCINEX DM) 30-600 MG 12hr tablet Take 1 tablet by mouth 2 (two) times daily.     famciclovir (FAMVIR) 500 MG tablet Take 1 tablet (500 mg total) by mouth 3 (three) times daily. (Patient taking differently: Take 500 mg by mouth 3 (three) times daily. As needed) 21 tablet 0   fluticasone (FLONASE) 50 MCG/ACT nasal spray SPRAY 2 SPRAYS INTO EACH NOSTRIL EVERY DAY 16 mL 1   Fluticasone-Umeclidin-Vilant (TRELEGY ELLIPTA) 200-62.5-25 MCG/ACT AEPB Inhale 1 puff into the lungs daily. 60 each 5   glucose blood (CONTOUR NEXT TEST) test strip Check blood sugar once daily 100 each 12   insulin glargine (LANTUS) 100 UNIT/ML Solostar Pen Inject 10 Units into the skin daily. 15 mL 0   Insulin Pen Needle (B-D UF III MINI PEN NEEDLES) 31G X 5  MM MISC USE WITH LANTUS 100 each 0   metFORMIN (GLUCOPHAGE) 1000 MG tablet TAKE 1 TABLET (1,000 MG TOTAL) BY MOUTH TWICE A DAY WITH FOOD 180 tablet 0   oxyCODONE-acetaminophen (PERCOCET/ROXICET) 5-325 MG tablet Take 1 tablet by mouth every 4 (four) hours as needed for moderate pain.     pantoprazole (PROTONIX) 40 MG tablet TAKE 1 TABLET BY MOUTH TWICE A DAY BEFORE A MEAL 180 tablet 1   predniSONE (DELTASONE) 10 MG tablet TAKE 1 TABLET (10 MG TOTAL) BY MOUTH DAILY WITH BREAKFAST. (Patient taking differently: Take 10 mg by mouth daily with breakfast. Taking 1/2 tablet daily) 30 tablet 3   pregabalin (LYRICA) 75 MG capsule TAKE 1 CAPSULE BY MOUTH TWICE A DAY (Patient taking differently: Take 75 mg by mouth 2 (two) times daily.) 60 capsule 3   sitaGLIPtin (JANUVIA) 50 MG tablet Take 1 tablet (50 mg total) by mouth daily. 30 tablet 3   STIOLTO RESPIMAT 2.5-2.5 MCG/ACT AERS Inhale 2  puffs into the lungs daily. 1 each 5   tamsulosin (FLOMAX) 0.4 MG CAPS capsule Take 2 capsules (0.8 mg total) by mouth daily. (Patient taking differently: Take 0.8 mg by mouth at bedtime.) 180 capsule 3   tiZANidine (ZANAFLEX) 4 MG tablet TAKE 1 TABLET BY MOUTH EVERY 6 HOURS AS NEEDED 360 tablet 1   venlafaxine XR (EFFEXOR-XR) 150 MG 24 hr capsule Take 1 capsule (150 mg total) by mouth daily with breakfast. 90 capsule 0   warfarin (COUMADIN) 5 MG tablet TAKE 1 TO 2 TABLETS DAILY OR AS PRESCRIBED BY COUMADIN CLINIC 130 tablet 0   zolpidem (AMBIEN) 10 MG tablet Take 0.5-1 tablets (5-10 mg total) by mouth at bedtime as needed for sleep. 15 tablet 1   Current Facility-Administered Medications  Medication Dose Route Frequency Provider Last Rate Last Admin   testosterone cypionate (DEPOTESTOSTERONE CYPIONATE) injection 200 mg  200 mg Intramuscular Q14 Days Saguier, Percell Miller, PA-C   200 mg at 07/06/21 1660    Allergies:   Daptomycin and Diltiazem   Social History:  The patient  reports that he quit smoking about 32 years ago. His smoking use included cigarettes. He has a 20.00 pack-year smoking history. He has never used smokeless tobacco. He reports current alcohol use of about 2.0 standard drinks of alcohol per week. He reports that he does not use drugs.   Family History:  The patient's family history includes COPD in his father; Cancer (age of onset: 28) in his mother; Diabetes in his mother; Emphysema in his father; Heart disease in his daughter, father, maternal grandfather, and paternal grandfather; Hyperlipidemia in his sister; Hypertension in his maternal grandmother, mother, and sister; Osteoporosis in his sister; Scoliosis in his maternal grandmother and sister; Stroke (age of onset: 61) in his father.   ROS:  Please see the history of present illness.   Otherwise, review of systems is positive for none.   All other systems are reviewed and negative.   PHYSICAL EXAM: VS:  BP 118/64   Pulse  (!) 58   Ht '5\' 10"'$  (1.778 m)   Wt 186 lb 12.8 oz (84.7 kg)   SpO2 98%   BMI 26.80 kg/m  , BMI Body mass index is 26.8 kg/m. GEN: Well nourished, well developed, in no acute distress  HEENT: normal  Neck: no JVD, carotid bruits, or masses Cardiac: RRR; no murmurs, rubs, or gallops,no edema  Respiratory:  clear to auscultation bilaterally, normal work of breathing GI: soft, nontender, nondistended, + BS MS:  no deformity or atrophy  Skin: warm and dry Neuro:  Strength and sensation are intact Psych: euthymic mood, full affect  EKG:  EKG is ordered today. Personal review of the ekg ordered shows sinus rhythm, rate 58  Recent Labs: 07/17/2021: B Natriuretic Peptide 82.0 01/25/2022: Magnesium 2.0 03/15/2022: Platelets 342.0; TSH 2.16 03/23/2022: ALT 15; BUN 13; Creatinine, Ser 0.78; Potassium 4.4; Sodium 129 04/25/2022: Hemoglobin CANCELED    Lipid Panel     Component Value Date/Time   CHOL 171 03/15/2022 1047   TRIG 104.0 03/15/2022 1047   HDL 79.80 03/15/2022 1047   CHOLHDL 2 03/15/2022 1047   VLDL 20.8 03/15/2022 1047   LDLCALC 70 03/15/2022 1047   LDLDIRECT 64.0 10/30/2017 1502     Wt Readings from Last 3 Encounters:  04/27/22 186 lb 12.8 oz (84.7 kg)  04/25/22 182 lb (82.6 kg)  04/22/22 186 lb (84.4 kg)      Other studies Reviewed: Additional studies/ records that were reviewed today include: TTE 11/11/20  Review of the above records today demonstrates:   1. Left ventricular ejection fraction, by estimation, is 60 to 65%. The  left ventricle has normal function. The left ventricle has no regional  wall motion abnormalities. Left ventricular diastolic parameters were  normal.   2. Right ventricular systolic function is normal. The right ventricular  size is mildly enlarged.   3. The mitral valve is normal in structure. No evidence of mitral valve  regurgitation.   4. The aortic valve was not well visualized. Aortic valve regurgitation  is not visualized. No aortic  stenosis is present.   5. The inferior vena cava is normal in size with greater than 50%  respiratory variability, suggesting right atrial pressure of 3 mmHg.   Cardiac monitor 02/16/2021 personally reviewed paroxysmal atrial fibrillation and occasional premature atrial complexes.  ASSESSMENT AND PLAN:  1.  Paroxysmal atrial fibrillation: Currently on warfarin.  CHA2DS2-VASc of 3.  Status post ablation 01/27/2022.  He is fortunately remained in sinus rhythm.  He has had no further episodes of atrial fibrillation.  We Devon Pretty stop digoxin today.  He Joshau Code potentially like to get off of warfarin.  I have told him that we can discuss this further at his next visit.  2.  Hypertension: Currently well controlled  3.  Secondary hypercoagulable state: Currently on warfarin for atrial fibrillation as above.   Current medicines are reviewed at length with the patient today.   The patient does not have concerns regarding his medicines.  The following changes were made today: Stop digoxin  Labs/ tests ordered today include:  Orders Placed This Encounter  Procedures   EKG 12-Lead      Disposition:   FU 6 months  Signed, Kobee Medlen Meredith Leeds, MD  04/27/2022 11:56 AM     [facility] [address] [place] [address] [phone] (office) [phone] (fax)

## 2022-04-27 NOTE — Telephone Encounter (Signed)
Additional financial documents have been received and have been faxed in to [facility]. Will await response.

## 2022-04-27 NOTE — Addendum Note (Signed)
Addended by: Malen Gauze on: 04/27/2022 04:24 PM   Modules accepted: Orders

## 2022-04-27 NOTE — Patient Instructions (Signed)
Medication Instructions:  Your physician has recommended you make the following change in your medication:  STOP Digoxin  *If you need a refill on your cardiac medications before your next appointment, please call your pharmacy*   Lab Work: None ordered If you have labs (blood work) drawn today and your tests are completely normal, you will receive your results only by: [facility] (if you have MyChart) OR A paper copy in the mail If you have any lab test that is abnormal or we need to change your treatment, we will call you to review the results.   Testing/Procedures: None ordered   Follow-Up: At [facility], you and your health needs are our priority.  As part of our continuing mission to provide you with exceptional heart care, we have created designated Provider Care Teams.  These Care Teams include your primary Cardiologist (physician) and Advanced Practice Providers (APPs -  Physician Assistants and Nurse Practitioners) who all work together to provide you with the care you need, when you need it.  Your next appointment:   6 month(s)  The format for your next appointment:   In Person  Provider:   You will see one of the following Advanced Practice Providers on your designated Care Team:   Tommye Standard, [place] Legrand Como "Jonni Sanger" Chalmers Cater, [place]     Thank you for choosing [facility]!!   Trinidad Curet, RN [phone]  Other Instructions   Important Information About Sugar

## 2022-05-01 ENCOUNTER — Other Ambulatory Visit: Payer: Self-pay | Admitting: Medical

## 2022-05-03 ENCOUNTER — Encounter: Payer: Self-pay | Admitting: Rehabilitative and Restorative Service Providers"

## 2022-05-03 ENCOUNTER — Ambulatory Visit: Payer: Medicare Other | Admitting: Rehabilitative and Restorative Service Providers"

## 2022-05-03 DIAGNOSIS — M546 Pain in thoracic spine: Secondary | ICD-10-CM | POA: Diagnosis not present

## 2022-05-03 DIAGNOSIS — M5459 Other low back pain: Secondary | ICD-10-CM

## 2022-05-03 DIAGNOSIS — R293 Abnormal posture: Secondary | ICD-10-CM

## 2022-05-03 DIAGNOSIS — M6281 Muscle weakness (generalized): Secondary | ICD-10-CM

## 2022-05-03 LAB — CBC WITH DIFFERENTIAL

## 2022-05-03 LAB — SPECIMEN STATUS REPORT

## 2022-05-03 NOTE — Therapy (Signed)
OUTPATIENT PHYSICAL THERAPY TREATMENT   Patient Name: Luis Brown MRN: [id number] DOB:12-11-1945, 76 y.o., male Today's Date: 05/03/2022  PCP: [name] MD  REFERRING PROVIDER: Lanae Crumbly, PA-C  END OF SESSION:   PT End of Session - 05/03/22 0927     Visit Number 2    Number of Visits 20    Date for PT Re-Evaluation 07/04/22    Authorization Type [employer] Medicare $20 copay    Progress Note Due on Visit 10    PT Start Time 0927    PT Stop Time 0941    PT Time Calculation (min) 14 min    Activity Tolerance Patient tolerated treatment well    Behavior During Therapy [facility] for tasks assessed/performed              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   Esophageal reflux 02/10/2015   Fatigue    HTN (hypertension)    Hyperglycemia    preDM    Hyperlipidemia    Insomnia    Low back pain radiating to both legs 01/16/2017   Measles as a child   Overweight(278.02)    Peripheral neuropathy 08/07/2019   Personal history of colonic polyps 10/27/2012   Follows with [facility]   Pneumonia    " walking"   Preventative health care    Testosterone deficiency 05/23/2012   Wears glasses    Past Surgical History:  Procedure Laterality Date   ATRIAL FIBRILLATION ABLATION N/A 01/27/2022   Procedure: ATRIAL FIBRILLATION ABLATION;  Surgeon: Constance Haw, MD;  Location: [place] [facility];  Service: Cardiovascular;  Laterality: N/A;   BACK SURGERY  2012 and 1994   Dr Margret Chance, screws and cage in low back   BIOPSY   01/09/2020   Procedure: BIOPSY;  Surgeon: Otis Brace, MD;  Location: [facility];  Service: Gastroenterology;;   BRONCHIAL BIOPSY  11/17/2020   Procedure: BRONCHIAL BIOPSIES;  Surgeon: Laurin Coder, MD;  Location: [facility];  Service: Endoscopy;;   BRONCHIAL BRUSHINGS  11/17/2020   Procedure: BRONCHIAL BRUSHINGS;  Surgeon: Laurin Coder, MD;  [facility]: [facility];  Service: Endoscopy;;   BRONCHIAL WASHINGS  11/17/2020   Procedure: BRONCHIAL WASHINGS;  Surgeon: Laurin Coder, MD;  [facility]: [facility];  Service: Endoscopy;;   COLONOSCOPY WITH PROPOFOL N/A 01/09/2020   Procedure: COLONOSCOPY WITH PROPOFOL;  Surgeon: Otis Brace, MD;  [facility];  Service: Gastroenterology;  Laterality: N/A;   ESOPHAGOGASTRODUODENOSCOPY (EGD) WITH PROPOFOL N/A 01/09/2020   Procedure: ESOPHAGOGASTRODUODENOSCOPY (EGD) WITH PROPOFOL;  Surgeon: Otis Brace, MD;  [facility];  Service: Gastroenterology;  Laterality: N/A;   EYE SURGERY Bilateral    2016   HARDWARE REVISION  03/12/2019   REVISION OF HARDWARE THORACIC TEN-THORACIC ELEVEN WITH APPLICATION OF ADDITIONAL RODS AND ROD SLEEVES THORACIC EIGHT-THORACIC TWELVE 12-LUMBAR ONE (N/A )   HEMOSTASIS CONTROL  11/17/2020   Procedure: HEMOSTASIS CONTROL;  Surgeon: Laurin Coder, MD;  [facility]: [facility];  Service:  Endoscopy;;   IR US GUIDE BX ASP/DRAIN  11/13/2020   LAMINECTOMY  02/12/2019   DECOMPRESSIVE LAMINECTOMY THORACIC NINE-THORACIC TEN AND THORACIC TEN-THORACIC ELEVEN, EXTENSION OF THORACOLUMBAR FUSION FROM THORACIC TEN TO THORACIC FIVE, LOCAL BONE GRAFT, ALLOGRAFT AND VIVIGEN (N/A)   POLYPECTOMY  01/09/2020   Procedure: POLYPECTOMY;  Surgeon: Otis Brace, MD;  [facility]: [facility];  Service: Gastroenterology;;   REMOVAL OF RODS AND PEDICLE SCREWS T5 AND T6, EXPLORATION OF FUSION, BIOPSY TRANSPEDICULAR T5 AND T6 (N/A )  09/14/2020   SPINAL FUSION N/A 02/12/2019   Procedure: DECOMPRESSIVE  LAMINECTOMY THORACIC NINE-THORACIC TEN AND THORACIC TEN-THORACIC ELEVEN, EXTENSION OF THORACOLUMBAR FUSION FROM THORACIC TEN TO THORACIC FIVE, LOCAL BONE GRAFT, ALLOGRAFT AND VIVIGEN;  Surgeon: Jessy Oto, MD;  Location: [place];  Service: Orthopedics;  Laterality: N/A;  DECOMPRESSIVE LAMINECTOMY THORACIC NINE-THORACIC TEN AND THORACIC TEN-THORACIC ELEVEN, EXTENSION OF TH   SPINAL FUSION N/A 03/12/2019   Procedure: REVISION OF HARDWARE THORACIC TEN-THORACIC ELEVEN WITH APPLICATION OF ADDITIONAL RODS AND ROD SLEEVES THORACIC EIGHT-THORACIC TWELVE 12-LUMBAR ONE;  Surgeon: Jessy Oto, MD;  Location: [place];  Service: Orthopedics;  Laterality: N/A;   TONSILLECTOMY     as child   torn rotator cuff  2010   right   VIDEO BRONCHOSCOPY N/A 11/17/2020   Procedure: VIDEO BRONCHOSCOPY WITH FLUORO;  Surgeon: Laurin Coder, MD;  [facility];  Service: Endoscopy;  Laterality: N/A;   Patient Active Problem List   Diagnosis Date Noted   Secondary hypercoagulable state ([place]) 03/16/2022   IPF (idiopathic pulmonary fibrosis) ([place]) 12/30/2021   Atrial fibrillation ([place]) 09/10/2021   Long term (current) use of anticoagulants 09/10/2021   Diabetes mellitus due to underlying condition with hyperosmolarity without coma, with long-term current use of insulin ([place]) 09/10/2021   Mixed hyperlipidemia 09/10/2021   Aspiration pneumonitis ([place]) 08/07/2021   Chronic respiratory failure with hypoxia ([place]) 08/07/2021   Pulmonary emboli ([place]) 07/16/2021   Bronchiectasis with acute exacerbation ([place]) 07/16/2021   History of DVT (deep vein thrombosis) 07/15/2021   Paroxysmal A-fib ([place]) 07/15/2021   COPD (chronic obstructive pulmonary disease) ([place]) 02/10/2021   Palpitations 02/01/2021   Wears glasses 01/11/2021   Pneumothorax, right 12/15/2020   Anxiety 12/01/2020   SOB (shortness of breath)    Nocturnal hypoxia 11/14/2020   S/P bronchoscopy 09/14/2020   Gastric ulcer 01/27/2020   H/O colonoscopy with  polypectomy 01/27/2020   Protein-calorie malnutrition, severe 01/09/2020   Abdominal pain 12/30/2019   Weight loss, abnormal 12/30/2019   Change in bowel habits 12/30/2019   Medication monitoring encounter 08/26/2019   Peripheral neuropathy 08/07/2019   Hyponatremia 05/05/2019   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[place]) 04/04/2019   Fusion of spine of thoracolumbar region 03/12/2019   Status post thoracic spinal fusion 02/12/2019   Thoracic spinal stenosis 04/17/2017   Painful orthopaedic hardware ([place]) 04/17/2017   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 ([place]) 04/17/2017   Low back pain radiating to both legs 01/16/2017   Esophageal reflux 02/10/2015   Essential hypertension 06/25/2014   Asthma 06/25/2014   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   Overweight 06/25/2014   Other abnormal glucose 06/25/2014   Spinal stenosis of lumbar region without neurogenic claudication 04/09/2014   Flatback syndrome 04/09/2014   Chicken pox 10/21/2013   History of colonic polyps 10/27/2012   Testosterone deficiency 05/23/2012   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   Benign enlargement of prostate 04/23/2012   ED (erectile dysfunction) 04/23/2012   Allergic  state 10/25/2011   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     REFERRING DIAG: M43.25 (ICD-10-CM) - Fusion of spine of thoracolumbar region M62.830 (ICD-10-CM) - Spasm of thoracic back muscle  Rationale for Evaluation and Treatment Rehabilitation  THERAPY DIAG:  Other low back pain  Pain in thoracic spine  Abnormal posture  Muscle weakness (generalized)  ONSET DATE: 03/2022  SUBJECTIVE:                                                                                                                                                                                            SUBJECTIVE STATEMENT: Pt indicated "feeling not so bad" this morning.  Pt indicated taking medicine this morning.  Pt indicated having less ache feel since the last visit.    PERTINENT HISTORY:  DDD, DM, HTN, hyperglycemia, hyperlipidemia, Peripherial neuropathy, presence of thoraco-lumbar fusions.   PAIN:  NPRS scale: 1-2/10 Pain location: mid and low back  Pain description: soreness Aggravating factors: lifting/carrying, bending, sometimes yard work activity, prolonged sitting Relieving factors: medicine, trying to stay activity   PRECAUTIONS: Other: thoracolumbar fusion  WEIGHT BEARING RESTRICTIONS No  FALLS:  Has patient fallen in last 6 months? No  LIVING ENVIRONMENT: Lives with: lives with their spouse Lives in: House/apartment Stairs: 2 stairs  OCCUPATION: Retired   PLOF: Independent, yard work (mowing back yard), house activity, gardening activity   PATIENT GOALS    Less pain   OBJECTIVE:   PATIENT SURVEYS:  04/25/2022 FOTO intake: 53   predicted:  80  SCREENING FOR RED FLAGS: 04/25/2022 Bowel or bladder incontinence: No Cauda equina syndrome: No  COGNITION: 04/25/2022  Overall cognitive status: WFL normal    SENSATION: 04/25/2022 Not specifically tested today  MUSCLE LENGTH: 04/25/2022 No specifically tested today  POSTURE:  04/25/2022 Forward head posture, reduced lumbar lordosis, mild increase in thoracic kyphosis above top   PALPATION: 04/25/2022 Mild tenderness in Rt thoracic paraspinals mid thoracic region.   LUMBAR ROM:    AROM  04/25/2022 AROM 05/03/2022  Flexion Hands to patella c mild pull in back   Extension 50% WFL - no complaints 50% WFL  Right lateral flexion    Left lateral flexion        Thoracic Right rotation 50% [facility]   Thoracic Left rotation 50 % WFL    (Blank rows = not tested)  LOWER EXTREMITY ROM:      Right 04/25/2022 Left 04/25/2022  Hip flexion    Hip extension    Hip abduction    Hip adduction    Hip internal rotation    Hip external rotation    Knee flexion    Knee extension    Ankle dorsiflexion    Ankle plantarflexion    Ankle inversion    Ankle eversion     (Blank rows = not tested)  LOWER EXTREMITY MMT:    MMT Right 04/25/2022 Left 04/25/2022  Hip flexion 4/5 4/5  Hip extension    Hip abduction    Hip adduction    Hip internal rotation    Hip external rotation    Knee flexion 5/5 5/5  Knee extension 5/5 5/5  Ankle dorsiflexion 5/5 5/5  Ankle plantarflexion    Ankle inversion    Ankle eversion     (Blank rows = not tested)  LUMBAR SPECIAL TESTS:  04/25/2022 (-) slump bilateral  FUNCTIONAL TESTS:  04/25/2022 18 inch chair s UE assist , bracing legs on chair Lt and Rt SLS - attempted but unable to maintain.    GAIT: 04/25/2022 Independent ambulation, antalgic gait c variable step lengths bilateral with increased lateral sway noted bilaterally.     TODAY'S TREATMENT  05/03/2022 Manual: Skilled palpation to muscles during dry needling.  STM to stame  Dry Needling: Verbal consent given regarding use.  Twitch response mild in mid thoracic superficial paraspinals.  No adverse reaction noted.  Instructions given on aftercare per handout instructions.    04/25/2022 Therex:    HEP instruction/performance c cues for techniques, handout provided.  Trial set performed of each for comprehension and symptom assessment.  See below for exercise list  Manual: Skilled palpation to muscles during dry needling.  STM to stame  Dry Needling: Handout provided.  Verbal consent given regarding use.  Twitch response mild in mid thoracic superficial paraspinals.  No adverse reaction noted.  Instructions given on aftercare per handout instructions.      PATIENT EDUCATION:  04/25/2022 Education details: HEP, POC Person educated: Patient Education method: Consulting civil engineer, Media planner, Verbal cues, and  Handouts Education comprehension: verbalized understanding, returned demonstration, and verbal cues required   HOME EXERCISE PROGRAM: Access Code: [facility] URL: [email or web address] Date: 04/25/2022 Prepared by: Scot Jun  Exercises - Shoulder External Rotation and Scapular Retraction  - 3-5 x daily - 7 x weekly - 1 sets - 5 reps - 2 hold - Standing Shoulder Row with Anchored Resistance  - 1-2 x daily - 7 x weekly - 1-2 sets - 10-15 reps - Shoulder Extension with Resistance  - 1-2 x daily - 7 x weekly - 1-2 sets - 10-15 reps - Sit to Stand  - 3 x daily - 7 x weekly - 1 sets - 5-10 reps  ASSESSMENT:  CLINICAL IMPRESSION: Pt indicated he had to leave early today which shorted treatment.  Pt reported some improvement in symptoms since last visit.  Pt to benefit from additional LE strengthening and balance intervention to improve stability in ambulation and functional movements of daily life.    OBJECTIVE IMPAIRMENTS Abnormal gait, decreased activity tolerance, decreased balance, decreased coordination, decreased endurance, decreased mobility, difficulty walking, decreased ROM, decreased strength, increased fascial restrictions, impaired perceived functional ability, increased muscle spasms, impaired flexibility, improper body mechanics, postural dysfunction, and pain.   ACTIVITY LIMITATIONS carrying, lifting, bending, sitting, transfers, and locomotion level  PARTICIPATION LIMITATIONS: meal prep, cleaning, community activity, and yard work  [place]  DDD, DM, HTN, hyperglycemia, hyperlipidemia, Peripherial neuropathy, presence of thoraco-lumbar  fusions.   are also affecting patient's functional outcome.   REHAB POTENTIAL: Fair to good  CLINICAL DECISION MAKING: Evolving/moderate complexity  EVALUATION COMPLEXITY: Moderate   GOALS: Goals reviewed with patient? Yes  Short term PT Goals (target date for Short term goals are 3 weeks 05/16/2022) Patient  will demonstrate independent use of home exercise program to maintain progress from in clinic treatments. Goal status: on going - assessed 05/03/2022   Long term PT goals (target dates for all long term goals are 10 weeks  07/04/2022 )   1. Patient will demonstrate/report pain at worst less than or equal to 2/10 to facilitate minimal limitation in daily activity secondary to pain symptoms. Goal status: New   2. Patient will demonstrate independent use of home exercise program to facilitate ability to maintain/progress functional gains from skilled physical therapy services. Goal status: New   3. Patient will demonstrate FOTO outcome > or = 53 % to indicate reduced disability due to condition. Goal status: New   4. Patient will demonstrate lumbar extension 75  % or greater WFL s symptoms to facilitate upright standing, walking posture at PLOF s limitation. Goal status: New   5.  Patient will demonstrate/report ability to perform usual daily house/yard activity s restriction due to symptoms.    Goal status: New   6.  Patient will demonstrate bilateral SLS > 5 seconds to improve stability in ambulation.   Goal status: New       PLAN: PT FREQUENCY: 1-2x/week  PT DURATION: 10 weeks  PLANNED INTERVENTIONS: Therapeutic exercises, Therapeutic activity, Neuro Muscular re-education, Balance training, Gait training, Patient/Family education, Joint mobilization, Stair training, DME instructions, Dry Needling, Electrical stimulation, Cryotherapy, Moist heat, Taping, Traction Ultrasound, Ionotophoresis '4mg'$ /ml Dexamethasone, and Manual therapy.  All included unless contraindicated   PLAN FOR NEXT SESSION: Static balance, general strengthening for posture, LE.   Scot Jun, PT, DPT, OCS, ATC 05/03/22  9:52 AM

## 2022-05-04 ENCOUNTER — Ambulatory Visit: Payer: Medicare Other | Attending: Cardiology | Admitting: *Deleted

## 2022-05-04 DIAGNOSIS — I480 Paroxysmal atrial fibrillation: Secondary | ICD-10-CM | POA: Diagnosis not present

## 2022-05-04 DIAGNOSIS — Z7901 Long term (current) use of anticoagulants: Secondary | ICD-10-CM

## 2022-05-04 DIAGNOSIS — I4891 Unspecified atrial fibrillation: Secondary | ICD-10-CM | POA: Diagnosis not present

## 2022-05-04 LAB — POCT INR: INR: 2 (ref 2.0–3.0)

## 2022-05-04 NOTE — Patient Instructions (Signed)
Description   Continue taking Warfarin 1.5 tablets daily except for 1 tablet on Wednesday. Recheck INR in 4 weeks. Coumadin Clinic# [phone]

## 2022-05-05 ENCOUNTER — Encounter: Payer: Self-pay | Admitting: Medical

## 2022-05-05 ENCOUNTER — Ambulatory Visit: Payer: Medicare Other | Admitting: Internal Medicine

## 2022-05-05 ENCOUNTER — Encounter: Payer: Self-pay | Admitting: Internal Medicine

## 2022-05-05 ENCOUNTER — Other Ambulatory Visit: Payer: Self-pay

## 2022-05-05 DIAGNOSIS — M4325 Fusion of spine, thoracolumbar region: Secondary | ICD-10-CM | POA: Diagnosis not present

## 2022-05-05 DIAGNOSIS — G062 Extradural and subdural abscess, unspecified: Secondary | ICD-10-CM | POA: Diagnosis not present

## 2022-05-05 DIAGNOSIS — M4624 Osteomyelitis of vertebra, thoracic region: Secondary | ICD-10-CM | POA: Diagnosis not present

## 2022-05-05 DIAGNOSIS — M4635 Infection of intervertebral disc (pyogenic), thoracolumbar region: Secondary | ICD-10-CM | POA: Diagnosis not present

## 2022-05-05 NOTE — Progress Notes (Signed)
[place] [facility] Infectious [facility]  CHIEF COMPLAINT:    Follow up for discitis/OM  SUBJECTIVE:    Luis Brown is a 76 y.o. male with PMHx as below who presents to the clinic for back infection.   Patient is here today for routine follow up.    He has a long history of spinal infection as noted in my note from 03/17/2021.  He was last seen by me on 12/21/21 as a virtual visit while at the [facility] with his wife.     He completed 8 weeks of tedizolid for his spinal infection and was then started on dalbavancin infusions every 2 weeks as a suppressive regimen in November 2022.      He has been tolerating this regimen well without significant worsening back pain or fevers.  He has chronic pain that is currently being managed.  His OPAT labs have shown normalization of his inflammatory markers for quite some time.  He continues to have issues with glycemic control.  His A1c was 9.0 in August 2023.  He saw orthopedics 2 weeks ago on 10/9.  ESR and CRP were not very remarkable.  He will go back to see Dr Louanne Skye on 05/09/22.    Please see A&P for the details of today's visit and status of the patient's medical problems.   Patient's Medications  New Prescriptions   No medications on file  Previous Medications   ALBUTEROL (PROVENTIL HFA) 108 (90 BASE) MCG/ACT INHALER    TAKE 2 PUFFS BY MOUTH EVERY 6 HOURS AS NEEDED FOR WHEEZE OR SHORTNESS OF BREATH   ATORVASTATIN (LIPITOR) 10 MG TABLET    TAKE 1 TABLET BY MOUTH EVERY DAY   BUSPIRONE (BUSPAR) 7.5 MG TABLET    Take 1 tablet (7.5 mg total) by mouth 2 (two) times daily.   COLCHICINE 0.6 MG TABLET    Take 1 tablet (0.6 mg total) by mouth 2 (two) times daily for 14 days.   DEXTROMETHORPHAN-GUAIFENESIN (MUCINEX DM) 30-600 MG 12HR TABLET    Take 1 tablet by mouth 2 (two) times daily.   FAMCICLOVIR (FAMVIR) 500 MG TABLET    Take 1 tablet (500 mg total) by mouth 3 (three) times daily.   FLUTICASONE (FLONASE) 50 MCG/ACT NASAL SPRAY     SPRAY 2 SPRAYS INTO EACH NOSTRIL EVERY DAY   FLUTICASONE-UMECLIDIN-VILANT (TRELEGY ELLIPTA) 200-62.5-25 MCG/ACT AEPB    Inhale 1 puff into the lungs daily.   GLUCOSE BLOOD (CONTOUR NEXT TEST) TEST STRIP    Check blood sugar once daily   INSULIN GLARGINE (LANTUS) 100 UNIT/ML SOLOSTAR PEN    Inject 10 Units into the skin daily.   INSULIN PEN NEEDLE (B-D UF III MINI PEN NEEDLES) 31G X 5 MM MISC    USE WITH LANTUS   METFORMIN (GLUCOPHAGE) 1000 MG TABLET    TAKE 1 TABLET (1,000 MG TOTAL) BY MOUTH TWICE A DAY WITH FOOD   OXYCODONE-ACETAMINOPHEN (PERCOCET/ROXICET) 5-325 MG TABLET    Take 1 tablet by mouth every 4 (four) hours as needed for moderate pain.   PANTOPRAZOLE (PROTONIX) 40 MG TABLET    TAKE 1 TABLET BY MOUTH TWICE A DAY BEFORE A MEAL   PREDNISONE (DELTASONE) 10 MG TABLET    TAKE 1 TABLET (10 MG TOTAL) BY MOUTH DAILY WITH BREAKFAST.   PREGABALIN (LYRICA) 75 MG CAPSULE    TAKE 1 CAPSULE BY MOUTH TWICE A DAY   SITAGLIPTIN (JANUVIA) 50 MG TABLET    Take 1  tablet (50 mg total) by mouth daily.   STIOLTO RESPIMAT 2.5-2.5 MCG/ACT AERS    Inhale 2 puffs into the lungs daily.   TAMSULOSIN (FLOMAX) 0.4 MG CAPS CAPSULE    Take 2 capsules (0.8 mg total) by mouth daily.   TIZANIDINE (ZANAFLEX) 4 MG TABLET    TAKE 1 TABLET BY MOUTH EVERY 6 HOURS AS NEEDED   VENLAFAXINE XR (EFFEXOR-XR) 150 MG 24 HR CAPSULE    Take 1 capsule (150 mg total) by mouth daily with breakfast.   WARFARIN (COUMADIN) 5 MG TABLET    TAKE 1 TO 2 TABLETS DAILY OR AS PRESCRIBED BY COUMADIN CLINIC   ZOLPIDEM (AMBIEN) 10 MG TABLET    Take 0.5-1 tablets (5-10 mg total) by mouth at bedtime as needed for sleep.  Modified Medications   No medications on file  Discontinued Medications   DALVANCE 500 MG SOLR    Inject 500 mg into the vein every 14 (fourteen) days. Pt states this is a every 2 week antibiotic. Could not state how long therapy should be.      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   Esophageal reflux 02/10/2015   Fatigue    HTN (hypertension)    Hyperglycemia    preDM    Hyperlipidemia    Insomnia    Low back pain radiating to both legs 01/16/2017   Measles as a child   Overweight(278.02)    Peripheral neuropathy 08/07/2019   Personal history of colonic polyps 10/27/2012   Follows with [facility]   Pneumonia    " walking"   Preventative health care    Testosterone deficiency 05/23/2012   Wears glasses     Social History   Tobacco Use   Smoking status: Former    Packs/day: 1.00    Years: 20.00    Total pack years: 20.00    Types: Cigarettes    Quit date: 07/18/1989    Years since quitting: 32.8   Smokeless tobacco: Never   Tobacco comments:    Former smoker 03/16/22  Vaping Use   Vaping Use: Never used  Substance Use Topics   Alcohol use: Yes    Alcohol/week: 2.0 standard drinks of alcohol    Types: 2 Standard drinks or equivalent per week    Comment: couple beers here and there 03/16/22   Drug use: No    Family History  Problem Relation Age of Onset   Hypertension Mother    Diabetes Mother        type 2   Cancer Mother 61       breast in remission   Emphysema Father        smoker   COPD Father        smoker   Stroke Father 51       mini   Heart disease Father    Hypertension Sister    Hyperlipidemia Sister    Scoliosis Sister    Osteoporosis Sister  Hypertension Maternal Grandmother    Scoliosis Maternal Grandmother    Heart disease Maternal Grandfather    Heart disease Paternal Grandfather        smoker   Heart disease  Daughter     Allergies  Allergen Reactions   Daptomycin     Drug induced pneumonia    Diltiazem Other (See Comments)    Passed out    Review of Systems  All other systems reviewed and are negative.    OBJECTIVE:    Vitals:   05/05/22 1344  BP: (!) 146/75  Pulse: 73  Temp: 97.6 F (36.4 C)  TempSrc: Oral  SpO2: 94%  Weight: 184 lb (83.5 kg)   Body mass index is 26.4 kg/m.  Physical Exam Constitutional:      Appearance: Normal appearance.  HENT:     Head: Normocephalic and atraumatic.  Eyes:     Extraocular Movements: Extraocular movements intact.     Conjunctiva/sclera: Conjunctivae normal.  Pulmonary:     Effort: Pulmonary effort is normal. No respiratory distress.  Abdominal:     General: There is no distension.     Palpations: Abdomen is soft.  Skin:    General: Skin is warm and dry.  Neurological:     General: No focal deficit present.     Mental Status: He is alert and oriented to person, place, and time.  Psychiatric:        Mood and Affect: Mood normal.        Behavior: Behavior normal.      Labs and Microbiology:    Latest Ref Rng & Units 04/25/2022   10:29 AM 03/15/2022   10:47 AM 01/27/2022    9:21 AM  CBC  WBC x10E3/uL CANCELED  8.2  5.4   Hemoglobin 13.0 - 17.0 g/dL  12.1  12.1   Hematocrit 39.0 - 52.0 %  35.5  35.9   Platelets 150.0 - 400.0 K/uL  342.0  271       Latest Ref Rng & Units 03/23/2022    7:52 AM 03/15/2022   10:47 AM 01/25/2022    2:16 PM  CMP  Glucose 70 - 99 mg/dL 159  267  191   BUN 6 - 23 mg/dL _0 Creatinine 0.40 - 1.50 mg/dL 0.78  0.76  0.76   Sodium 135 - 145 mEq/L 129  130  134   Potassium 3.5 - 5.1 mEq/L 4.4  4.6  4.5   Chloride 96 - 112 mEq/L 91  93  96   CO2 19 - 32 mEq/L _1 Calcium 8.4 - 10.5 mg/dL 9.2  9.1  9.4   Total Protein 6.0 - 8.3 g/dL 6.0  6.1    Total Bilirubin 0.2 - 1.2 mg/dL 0.7  0.9    Alkaline Phos 39 - 117 U/L 90  93    AST 0 - 37 U/L 21  21    ALT 0 - 53 U/L 15  13        No results found for this or any previous visit (from the past 240 hour(s)).    ASSESSMENT & PLAN:    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HCC) Patient completed 8 weeks of tedizolid for his complicated spinal infection with hardware as documented in my note from 03/17/21.  He has been on dalbavancin 1049m every 2 weeks via PIV [place] a suppressive regimen since 05/28/2021.  His inflammatory markers  have normalized and been stable for several months and his chronic back pain is stable.  We discussed continuing suppressive regimen vs stopping antibiotics and observation.  He prefers to try the latter which I think is reasonable after this prolonged period of time.  I will see him back again in about 6-8 weeks to clinically reassess how he is doing.    [facility]  [facility] 05/05/2022, 1:57 PM

## 2022-05-05 NOTE — Assessment & Plan Note (Signed)
Patient completed 8 weeks of tedizolid for his complicated spinal infection with hardware as documented in my note from 03/17/21.  He has been on dalbavancin '1000mg'$  every 2 weeks via PIV from [place] as a suppressive regimen since 05/28/2021.  His inflammatory markers have normalized and been stable for several months and his chronic back pain is stable.  We discussed continuing suppressive regimen vs stopping antibiotics and observation.  He prefers to try the latter which I think is reasonable after this prolonged period of time.  I will see him back again in about 6-8 weeks to clinically reassess how he is doing.

## 2022-05-06 ENCOUNTER — Encounter: Payer: Self-pay | Admitting: Physical Therapy

## 2022-05-06 ENCOUNTER — Ambulatory Visit: Payer: Medicare Other | Admitting: Physical Therapy

## 2022-05-06 DIAGNOSIS — M6281 Muscle weakness (generalized): Secondary | ICD-10-CM | POA: Diagnosis not present

## 2022-05-06 DIAGNOSIS — R293 Abnormal posture: Secondary | ICD-10-CM

## 2022-05-06 DIAGNOSIS — M546 Pain in thoracic spine: Secondary | ICD-10-CM

## 2022-05-06 DIAGNOSIS — M5459 Other low back pain: Secondary | ICD-10-CM

## 2022-05-06 NOTE — Therapy (Signed)
OUTPATIENT PHYSICAL THERAPY TREATMENT   Patient Name: Luis Brown MRN: [id number] DOB:1946/07/10, 76 y.o., male Today's Date: 05/06/2022  PCP: [name] MD  REFERRING PROVIDER: Lanae Crumbly, PA-C  END OF SESSION:   PT End of Session - 05/06/22 0922     Visit Number 3    Number of Visits 20    Date for PT Re-Evaluation 07/04/22    Authorization Type [employer] Medicare $20 copay    Progress Note Due on Visit 10    PT Start Time 0922    PT Stop Time 1004    PT Time Calculation (min) 42 min    Activity Tolerance Patient tolerated treatment well    Behavior During Therapy [facility] for tasks assessed/performed                 REFERRING DIAG: M43.25 (ICD-10-CM) - Fusion of spine of thoracolumbar region M62.830 (ICD-10-CM) - Spasm of thoracic back muscle  Rationale for Evaluation and Treatment Rehabilitation  THERAPY DIAG:  Other low back pain  Pain in thoracic spine  Abnormal posture  Muscle weakness (generalized)  ONSET DATE: 03/2022  SUBJECTIVE:                                                                                                                                                                                           SUBJECTIVE STATEMENT: Feels his back is getting better, thinks DN has been helpful  PERTINENT HISTORY:  DDD, DM, HTN, hyperglycemia, hyperlipidemia, Peripherial neuropathy, presence of thoraco-lumbar fusions.   PAIN:  NPRS scale: 1-2/10 Pain location: mid and low back  Pain description: soreness Aggravating factors: lifting/carrying, bending, sometimes yard work activity, prolonged sitting Relieving factors: medicine, trying to stay activity   PRECAUTIONS: Other: thoracolumbar fusion  WEIGHT BEARING RESTRICTIONS No  FALLS:  Has patient fallen in last 6 months? No  LIVING ENVIRONMENT: Lives with: lives with their spouse Lives in: House/apartment Stairs: 2 stairs  OCCUPATION: Retired   PLOF: Independent, yard work  (mowing back yard), house activity, gardening activity   PATIENT GOALS    Less pain   OBJECTIVE:   PATIENT SURVEYS:  04/25/2022 FOTO intake: 53   predicted:  23  SCREENING FOR RED FLAGS: 04/25/2022 Bowel or bladder incontinence: No Cauda equina syndrome: No  COGNITION: 04/25/2022  Overall cognitive status: WFL normal    SENSATION: 04/25/2022 Not specifically tested today  MUSCLE LENGTH: 04/25/2022 No specifically tested today  POSTURE:  04/25/2022 Forward head posture, reduced lumbar lordosis, mild increase in thoracic kyphosis above top   PALPATION: 04/25/2022 Mild tenderness in Rt thoracic paraspinals mid thoracic region.   LUMBAR ROM:  AROM  04/25/2022 AROM 05/03/2022  Flexion Hands to patella c mild pull in back   Extension 50% WFL - no complaints 50% WFL  Right lateral flexion    Left lateral flexion        Thoracic Right rotation 50% [facility]   Thoracic Left rotation 50 % WFL    (Blank rows = not tested)  LOWER EXTREMITY MMT:    MMT Right 04/25/2022 Left 04/25/2022  Hip flexion 4/5 4/5  Hip extension    Hip abduction    Hip adduction    Hip internal rotation    Hip external rotation    Knee flexion 5/5 5/5  Knee extension 5/5 5/5  Ankle dorsiflexion 5/5 5/5  Ankle plantarflexion    Ankle inversion    Ankle eversion     (Blank rows = not tested)  LUMBAR SPECIAL TESTS:  04/25/2022 (-) slump bilateral  FUNCTIONAL TESTS:  04/25/2022 18 inch chair s UE assist , bracing legs on chair Lt and Rt SLS - attempted but unable to maintain.    GAIT: 04/25/2022 Independent ambulation, antalgic gait c variable step lengths bilateral with increased lateral sway noted bilaterally.     TODAY'S TREATMENT  05/06/22 TherEx NuStep L6 x 8 min Rows L3 band 2x10; 5 sec hold Overhead pull down L3 band 2x10, 5 sec hold Bil ER with L3 band 2x10  Manual STM with compression to Rt thoracic paraspinals; skilled palpation and monitoring of soft tissue during  DN IASTM with tennis ball on wall to Rt thoracic paraspinals with trigger point release  Trigger Point Dry-Needling  Treatment instructions: Expect mild to moderate muscle soreness. S/S of pneumothorax if dry needled over a lung field, and to seek immediate medical attention should they occur. Patient verbalized understanding of these instructions and education.  Patient Consent Given: Yes Education handout provided: Previously provided Muscles treated: Rt thoracic paraspinals Electrical stimulation performed: No Parameters: N/A Treatment response/outcome: twitch responses noted     05/03/2022 Manual: Skilled palpation to muscles during dry needling.  STM to stame  Dry Needling: Verbal consent given regarding use.  Twitch response mild in mid thoracic superficial paraspinals.  No adverse reaction noted.  Instructions given on aftercare per handout instructions.    04/25/2022 Therex:    HEP instruction/performance c cues for techniques, handout provided.  Trial set performed of each for comprehension and symptom assessment.  See below for exercise list  Manual: Skilled palpation to muscles during dry needling.  STM to stame  Dry Needling: Handout provided.  Verbal consent given regarding use.  Twitch response mild in mid thoracic superficial paraspinals.  No adverse reaction noted.  Instructions given on aftercare per handout instructions.      PATIENT EDUCATION:  04/25/2022 Education details: HEP, POC Person educated: Patient Education method: Consulting civil engineer, Media planner, Verbal cues, and Handouts Education comprehension: verbalized understanding, returned demonstration, and verbal cues required   HOME EXERCISE PROGRAM: Access Code: [facility] URL: [email or web address] Date: 04/25/2022 Prepared by: Luis Brown  Exercises - Shoulder External Rotation and Scapular Retraction  - 3-5 x daily - 7 x weekly - 1 sets - 5 reps - 2 hold - Standing Shoulder Row  with Anchored Resistance  - 1-2 x daily - 7 x weekly - 1-2 sets - 10-15 reps - Shoulder Extension with Resistance  - 1-2 x daily - 7 x weekly - 1-2 sets - 10-15 reps - Sit to Stand  - 3 x daily - 7 x weekly - 1 sets - 5-10 reps  ASSESSMENT:  CLINICAL IMPRESSION: Pt tolerated session well with positive response to DN at this time.  Continued to work on Hotel manager.  Will continue to benefit from PT to maximize function.   OBJECTIVE IMPAIRMENTS Abnormal gait, decreased activity tolerance, decreased balance, decreased coordination, decreased endurance, decreased mobility, difficulty walking, decreased ROM, decreased strength, increased fascial restrictions, impaired perceived functional ability, increased muscle spasms, impaired flexibility, improper body mechanics, postural dysfunction, and pain.   ACTIVITY LIMITATIONS carrying, lifting, bending, sitting, transfers, and locomotion level  PARTICIPATION LIMITATIONS: meal prep, cleaning, community activity, and yard work  [place]  DDD, DM, HTN, hyperglycemia, hyperlipidemia, Peripherial neuropathy, presence of thoraco-lumbar fusions.   are also affecting patient's functional outcome.   REHAB POTENTIAL: Fair to good  CLINICAL DECISION MAKING: Evolving/moderate complexity  EVALUATION COMPLEXITY: Moderate   GOALS: Goals reviewed with patient? Yes  Short term PT Goals (target date for Short term goals are 3 weeks 05/16/2022) Patient will demonstrate independent use of home exercise program to maintain progress from in clinic treatments. Goal status: on going - assessed 05/03/2022   Long term PT goals (target dates for all long term goals are 10 weeks  07/04/2022 )   1. Patient will demonstrate/report pain at worst less than or equal to 2/10 to facilitate minimal limitation in daily activity secondary to pain symptoms. Goal status: New   2. Patient will demonstrate independent use of home exercise program to facilitate ability  to maintain/progress functional gains from skilled physical therapy services. Goal status: New   3. Patient will demonstrate FOTO outcome > or = 53 % to indicate reduced disability due to condition. Goal status: New   4. Patient will demonstrate lumbar extension 75  % or greater WFL s symptoms to facilitate upright standing, walking posture at PLOF s limitation. Goal status: New   5.  Patient will demonstrate/report ability to perform usual daily house/yard activity s restriction due to symptoms.    Goal status: New   6.  Patient will demonstrate bilateral SLS > 5 seconds to improve stability in ambulation.   Goal status: New       PLAN: PT FREQUENCY: 1-2x/week  PT DURATION: 10 weeks  PLANNED INTERVENTIONS: Therapeutic exercises, Therapeutic activity, Neuro Muscular re-education, Balance training, Gait training, Patient/Family education, Joint mobilization, Stair training, DME instructions, Dry Needling, Electrical stimulation, Cryotherapy, Moist heat, Taping, Traction Ultrasound, Ionotophoresis '4mg'$ /ml Dexamethasone, and Manual therapy.  All included unless contraindicated   PLAN FOR NEXT SESSION: DN PRN,  Static balance, general strengthening for posture, LE.   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   Esophageal reflux 02/10/2015   Fatigue    HTN (hypertension)    Hyperglycemia    preDM    Hyperlipidemia    Insomnia    Low back pain radiating to both legs 01/16/2017   Measles as  a child   Overweight(278.02)    Peripheral neuropathy 08/07/2019   Personal history  of colonic polyps 10/27/2012   Follows with [facility]   Pneumonia    " walking"   Preventative health care    Testosterone deficiency 05/23/2012   Wears glasses    Past Surgical History:  Procedure Laterality Date   ATRIAL FIBRILLATION ABLATION N/A 01/27/2022   Procedure: ATRIAL FIBRILLATION ABLATION;  Surgeon: Constance Haw, MD;  Location: [place];  Service: Cardiovascular;  Laterality: N/A;   BACK SURGERY  2012 and 1994   Dr Margret Chance, screws and cage in low back   BIOPSY  01/09/2020   Procedure: BIOPSY;  Surgeon: Otis Brace, MD;  Location: [facility];  Service: Gastroenterology;;   BRONCHIAL BIOPSY  11/17/2020   Procedure: BRONCHIAL BIOPSIES;  Surgeon: Laurin Coder, MD;  Location: [facility];  Service: Endoscopy;;   BRONCHIAL BRUSHINGS  11/17/2020   Procedure: BRONCHIAL BRUSHINGS;  Surgeon: Laurin Coder, MD;  Location: [facility];  Service: Endoscopy;;   BRONCHIAL WASHINGS  11/17/2020   Procedure: BRONCHIAL WASHINGS;  Surgeon: Laurin Coder, MD;  Location: [facility];  Service: Endoscopy;;   COLONOSCOPY WITH PROPOFOL N/A 01/09/2020   Procedure: COLONOSCOPY WITH PROPOFOL;  Surgeon: Otis Brace, MD;  Location: [facility];  Service: Gastroenterology;  Laterality: N/A;   ESOPHAGOGASTRODUODENOSCOPY (EGD) WITH PROPOFOL N/A 01/09/2020   Procedure: ESOPHAGOGASTRODUODENOSCOPY (EGD) WITH PROPOFOL;  Surgeon: Otis Brace, MD;  [facility];  Service: Gastroenterology;  Laterality: N/A;   EYE SURGERY Bilateral    2016   HARDWARE REVISION  03/12/2019   REVISION OF HARDWARE THORACIC TEN-THORACIC ELEVEN WITH APPLICATION OF ADDITIONAL RODS AND ROD SLEEVES THORACIC EIGHT-THORACIC TWELVE 12-LUMBAR ONE (N/A )   HEMOSTASIS CONTROL  11/17/2020   Procedure: HEMOSTASIS CONTROL;  Surgeon: Laurin Coder, MD;  Location: [facility];  Service:  Endoscopy;;   IR US GUIDE BX ASP/DRAIN  11/13/2020   LAMINECTOMY  02/12/2019   DECOMPRESSIVE LAMINECTOMY THORACIC NINE-THORACIC TEN AND THORACIC TEN-THORACIC ELEVEN, EXTENSION OF THORACOLUMBAR FUSION FROM THORACIC TEN TO THORACIC FIVE, LOCAL BONE GRAFT, ALLOGRAFT AND VIVIGEN (N/A)   POLYPECTOMY  01/09/2020   Procedure: POLYPECTOMY;  Surgeon: Otis Brace, MD;  Location: [facility];  Service: Gastroenterology;;   REMOVAL OF RODS AND PEDICLE SCREWS T5 AND T6, EXPLORATION OF FUSION, BIOPSY TRANSPEDICULAR T5 AND T6 (N/A )  09/14/2020   SPINAL FUSION N/A 02/12/2019   Procedure: DECOMPRESSIVE LAMINECTOMY THORACIC NINE-THORACIC TEN AND THORACIC TEN-THORACIC ELEVEN, EXTENSION OF THORACOLUMBAR FUSION FROM THORACIC TEN TO THORACIC FIVE, LOCAL BONE GRAFT, ALLOGRAFT AND VIVIGEN;  Surgeon: Jessy Oto, MD;  Location: [place];  Service: Orthopedics;  Laterality: N/A;  DECOMPRESSIVE LAMINECTOMY THORACIC NINE-THORACIC TEN AND THORACIC TEN-THORACIC ELEVEN, EXTENSION OF TH   SPINAL FUSION N/A 03/12/2019   Procedure: REVISION OF HARDWARE THORACIC TEN-THORACIC ELEVEN WITH APPLICATION OF ADDITIONAL RODS AND ROD SLEEVES THORACIC EIGHT-THORACIC TWELVE 12-LUMBAR ONE;  Surgeon: Jessy Oto, MD;  Location: [place];  Service: Orthopedics;  Laterality: N/A;   TONSILLECTOMY     as child   torn rotator cuff  2010   right   VIDEO BRONCHOSCOPY N/A 11/17/2020   Procedure: VIDEO BRONCHOSCOPY WITH FLUORO;  Surgeon: Laurin Coder, MD;  Location: [facility];  Service: Endoscopy;  Laterality: N/A;   Patient Active Problem List   Diagnosis Date Noted   Secondary hypercoagulable state ([place]) 03/16/2022   IPF (idiopathic pulmonary fibrosis) ([place]) 12/30/2021   Atrial fibrillation ([place]) 09/10/2021   Long term (current) use of anticoagulants 09/10/2021   Diabetes mellitus due to underlying condition with hyperosmolarity without coma,  with long-term current use of insulin ([place]) 09/10/2021   Mixed hyperlipidemia 09/10/2021    Aspiration pneumonitis ([place]) 08/07/2021   Chronic respiratory failure with hypoxia ([place]) 08/07/2021   Pulmonary emboli ([place]) 07/16/2021   Bronchiectasis with acute exacerbation ([place]) 07/16/2021   History of DVT (deep vein thrombosis) 07/15/2021   Paroxysmal A-fib ([place]) 07/15/2021   COPD (chronic obstructive pulmonary disease) ([place]) 02/10/2021   Palpitations 02/01/2021   Wears glasses 01/11/2021   Pneumothorax, right 12/15/2020   Anxiety 12/01/2020   SOB (shortness of breath)    Nocturnal hypoxia 11/14/2020   S/P bronchoscopy 09/14/2020   Gastric ulcer 01/27/2020   H/O colonoscopy with polypectomy 01/27/2020   Protein-calorie malnutrition, severe 01/09/2020   Abdominal pain 12/30/2019   Weight loss, abnormal 12/30/2019   Change in bowel habits 12/30/2019   Medication monitoring encounter 08/26/2019   Peripheral neuropathy 08/07/2019   Hyponatremia 05/05/2019   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[place]) 04/04/2019   Fusion of spine of thoracolumbar region 03/12/2019   Status post thoracic spinal fusion 02/12/2019   Thoracic spinal stenosis 04/17/2017   Painful orthopaedic hardware ([place]) 04/17/2017   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 ([place]) 04/17/2017   Low back pain radiating to both legs 01/16/2017   Esophageal reflux 02/10/2015   Essential hypertension 06/25/2014   Asthma 06/25/2014   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   Overweight 06/25/2014   Other abnormal glucose 06/25/2014   Spinal stenosis of lumbar region without neurogenic claudication 04/09/2014   Flatback syndrome 04/09/2014   Chicken pox 10/21/2013   History of colonic polyps 10/27/2012   Testosterone deficiency 05/23/2012   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   Benign enlargement of prostate 04/23/2012   ED (erectile dysfunction) 04/23/2012   Allergic state 10/25/2011   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

## 2022-05-07 ENCOUNTER — Telehealth: Payer: Self-pay | Admitting: Medical

## 2022-05-07 NOTE — Telephone Encounter (Signed)
  Dr. [name],  Wanted your opinion on Luis Brown concern/my chart message he sen me. I sent you his most recent note.  He is on effexor for anxiety and in past had intermittently used valium. I prescribed buspar recently but not helping.  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 I am not sure how to address his concern since he he is taking ambien and narcotic. Concern valium would be excessive.  "Hi, it's been 12 days Sense I started this med. It is not that much help. I'm still pretty stressed out and anxious. Constantly worrying about my health and dying. I need to be able to calm down and be happy. Thank you, Luis Brown"

## 2022-05-09 ENCOUNTER — Encounter: Payer: Self-pay | Admitting: Medical

## 2022-05-09 ENCOUNTER — Ambulatory Visit: Payer: Medicare Other | Admitting: Specialist

## 2022-05-09 MED ORDER — BUSPIRONE HCL 15 MG PO TABS: 15.0000 mg | ORAL_TABLET | Freq: Two times a day (BID) | ORAL | 0 refills | Status: DC

## 2022-05-09 NOTE — Addendum Note (Signed)
Addended by: Anabel Halon on: 05/09/2022 12:11 PM   Modules accepted: Orders

## 2022-05-10 ENCOUNTER — Ambulatory Visit (INDEPENDENT_AMBULATORY_CARE_PROVIDER_SITE_OTHER): Payer: Medicare Other | Admitting: Rehabilitative and Restorative Service Providers"

## 2022-05-10 ENCOUNTER — Encounter: Payer: Self-pay | Admitting: Rehabilitative and Restorative Service Providers"

## 2022-05-10 DIAGNOSIS — M5459 Other low back pain: Secondary | ICD-10-CM

## 2022-05-10 DIAGNOSIS — R293 Abnormal posture: Secondary | ICD-10-CM

## 2022-05-10 DIAGNOSIS — M546 Pain in thoracic spine: Secondary | ICD-10-CM | POA: Diagnosis not present

## 2022-05-10 DIAGNOSIS — M6281 Muscle weakness (generalized): Secondary | ICD-10-CM

## 2022-05-10 NOTE — Telephone Encounter (Signed)
Per Celina at [facility], acceptable documentation was not submitted; the summary of the patients 10-40 was received but a copy of the actual 10-40 is needed. Specified was that the first 2 pages of the 10-40 (NOT the summary) would need to be submitted.

## 2022-05-10 NOTE — Therapy (Addendum)
OUTPATIENT PHYSICAL THERAPY TREATMENT [place]   Patient Name: Luis Brown MRN: [id number] DOB:1946-05-06, 76 y.o., male Today's Date: 05/10/2022  PCP: [name] MD  REFERRING PROVIDER: Lanae Crumbly, PA-C  END OF SESSION:   PT End of Session - 05/10/22 1046     Visit Number 4    Number of Visits 20    Date for PT Re-Evaluation 07/04/22    Authorization Type [employer] Medicare $20 copay    Progress Note Due on Visit 10    PT Start Time 1050    PT Stop Time 1130    PT Time Calculation (min) 40 min    Activity Tolerance Patient tolerated treatment well    Behavior During Therapy WFL for tasks assessed/performed                  REFERRING DIAG: M43.25 (ICD-10-CM) - Fusion of spine of thoracolumbar region M62.830 (ICD-10-CM) - Spasm of thoracic back muscle  Rationale for Evaluation and Treatment Rehabilitation  THERAPY DIAG:  Other low back pain  Pain in thoracic spine  Abnormal posture  Muscle weakness (generalized)  ONSET DATE: 03/2022  SUBJECTIVE:                                                                                                                                                                                           SUBJECTIVE STATEMENT: Pt indicated feeling 3/10 pain or so upon arrival.  Pt indicated feeling consistently better overall in back compared to previous.   PERTINENT HISTORY:  DDD, DM, HTN, hyperglycemia, hyperlipidemia, Peripherial neuropathy, presence of thoraco-lumbar fusions.   PAIN:  NPRS scale: up 3/10 Pain location: mid and low back  Pain description: soreness Aggravating factors: lifting/carrying, bending, sometimes yard work activity, prolonged sitting Relieving factors: medicine, trying to stay activity   PRECAUTIONS: Other: thoracolumbar fusion  WEIGHT BEARING RESTRICTIONS No  FALLS:  Has patient fallen in last 6 months? No  LIVING ENVIRONMENT: Lives with: lives with their spouse Lives in:  House/apartment Stairs: 2 stairs  OCCUPATION: Retired   PLOF: Independent, yard work (mowing back yard), house activity, gardening activity   PATIENT GOALS    Less pain   OBJECTIVE:   PATIENT SURVEYS:  04/25/2022 FOTO intake: 53   predicted:  66  SCREENING FOR RED FLAGS: 04/25/2022 Bowel or bladder incontinence: No Cauda equina syndrome: No  COGNITION: 04/25/2022  Overall cognitive status: WFL normal    SENSATION: 04/25/2022 Not specifically tested today  MUSCLE LENGTH: 04/25/2022 No specifically tested today  POSTURE:  04/25/2022 Forward head posture, reduced lumbar lordosis, mild increase in thoracic kyphosis above top   PALPATION: 04/25/2022  Mild tenderness in Rt thoracic paraspinals mid thoracic region.   LUMBAR ROM:    AROM  04/25/2022 AROM 05/03/2022  Flexion Hands to patella c mild pull in back   Extension 50% WFL - no complaints 50% WFL  Right lateral flexion    Left lateral flexion        Thoracic Right rotation 50% [facility]   Thoracic Left rotation 50 % WFL    (Blank rows = not tested)  LOWER EXTREMITY MMT:    MMT Right 04/25/2022 Left 04/25/2022  Hip flexion 4/5 4/5  Hip extension    Hip abduction    Hip adduction    Hip internal rotation    Hip external rotation    Knee flexion 5/5 5/5  Knee extension 5/5 5/5  Ankle dorsiflexion 5/5 5/5  Ankle plantarflexion    Ankle inversion    Ankle eversion     (Blank rows = not tested)  LUMBAR SPECIAL TESTS:  04/25/2022 (-) slump bilateral  FUNCTIONAL TESTS:  04/25/2022 18 inch chair s UE assist , bracing legs on chair Lt and Rt SLS - attempted but unable to maintain.    GAIT: 04/25/2022 Independent ambulation, antalgic gait c variable step lengths bilateral with increased lateral sway noted bilaterally.     TODAY'S TREATMENT  05/10/2022: Manual STM with compression to Rt thoracic paraspinals; skilled palpation and monitoring of soft tissue during DN  Trigger Point Dry-Needling  Treatment  instructions: Expect mild to moderate muscle soreness. S/S of pneumothorax if dry needled over a lung field, and to seek immediate medical attention should they occur. Patient verbalized understanding of these instructions and education.  Patient Consent Given: Yes Education handout provided: Previously provided Muscles treated: Rt thoracic mid level paraspinals Electrical stimulation performed: No Parameters: N/A Treatment response/outcome: twitch responses noted  Therex Nustep lvl 6 UE/LE 7 mins Leg press Double leg 75 lbs x 15, single leg 37 lbs x 15 bilateral Tband green rows c scapular retraction x 20 Tband green bilateral ER c scapular retraction x 10  Neuro Re-ed Tandem stance 30 sec x 2 bilateral c occasional hand assist on bars Alternating heel /toe lifts for ankle strategy improvements x 10 in standing (difficulty, switched to sitting x 10)    05/06/22 TherEx NuStep L6 x 8 min Rows L3 band 2x10; 5 sec hold Overhead pull down L3 band 2x10, 5 sec hold Bil ER with L3 band 2x10  Manual STM with compression to Rt thoracic paraspinals; skilled palpation and monitoring of soft tissue during DN IASTM with tennis ball on wall to Rt thoracic paraspinals with trigger point release  Trigger Point Dry-Needling  Treatment instructions: Expect mild to moderate muscle soreness. S/S of pneumothorax if dry needled over a lung field, and to seek immediate medical attention should they occur. Patient verbalized understanding of these instructions and education.  Patient Consent Given: Yes Education handout provided: Previously provided Muscles treated: Rt thoracic paraspinals Electrical stimulation performed: No Parameters: N/A Treatment response/outcome: twitch responses noted   05/03/2022 Manual: Skilled palpation to muscles during dry needling.  STM to stame  Dry Needling: Verbal consent given regarding use.  Twitch response mild in mid thoracic superficial paraspinals.  No  adverse reaction noted.  Instructions given on aftercare per handout instructions.     PATIENT EDUCATION:  04/25/2022 Education details: HEP, POC Person educated: Patient Education method: Consulting civil engineer, Media planner, Verbal cues, and Handouts Education comprehension: verbalized understanding, returned demonstration, and verbal cues required   HOME EXERCISE PROGRAM: Access Code: [email or web address]  URL: [email or web address] Date: 04/25/2022 Prepared by: Scot Jun  Exercises - Shoulder External Rotation and Scapular Retraction  - 3-5 x daily - 7 x weekly - 1 sets - 5 reps - 2 hold - Standing Shoulder Row with Anchored Resistance  - 1-2 x daily - 7 x weekly - 1-2 sets - 10-15 reps - Shoulder Extension with Resistance  - 1-2 x daily - 7 x weekly - 1-2 sets - 10-15 reps - Sit to Stand  - 3 x daily - 7 x weekly - 1 sets - 5-10 reps  ASSESSMENT:  CLINICAL IMPRESSION: Pt to benefit from continued skilled PT services to improve LE strength, ankle control for balance improvements as well as continued manual/dry needling as desired to improve thoracic pain complaints.  Positive reduction in severity noted overall with visits to this point.    OBJECTIVE IMPAIRMENTS Abnormal gait, decreased activity tolerance, decreased balance, decreased coordination, decreased endurance, decreased mobility, difficulty walking, decreased ROM, decreased strength, increased fascial restrictions, impaired perceived functional ability, increased muscle spasms, impaired flexibility, improper body mechanics, postural dysfunction, and pain.   ACTIVITY LIMITATIONS carrying, lifting, bending, sitting, transfers, and locomotion level  PARTICIPATION LIMITATIONS: meal prep, cleaning, community activity, and yard work  [place]  DDD, DM, HTN, hyperglycemia, hyperlipidemia, Peripherial neuropathy, presence of thoraco-lumbar fusions.   are also affecting patient's functional outcome.   REHAB POTENTIAL: Fair  to good  CLINICAL DECISION MAKING: Evolving/moderate complexity  EVALUATION COMPLEXITY: Moderate   GOALS: Goals reviewed with patient? Yes  Short term PT Goals (target date for Short term goals are 3 weeks 05/16/2022) Patient will demonstrate independent use of home exercise program to maintain progress from in clinic treatments. Goal status: on going - assessed 05/03/2022   Long term PT goals (target dates for all long term goals are 10 weeks  07/04/2022 )   1. Patient will demonstrate/report pain at worst less than or equal to 2/10 to facilitate minimal limitation in daily activity secondary to pain symptoms. Goal status: New   2. Patient will demonstrate independent use of home exercise program to facilitate ability to maintain/progress functional gains from skilled physical therapy services. Goal status: New   3. Patient will demonstrate FOTO outcome > or = 53 % to indicate reduced disability due to condition. Goal status: New   4. Patient will demonstrate lumbar extension 75  % or greater WFL s symptoms to facilitate upright standing, walking posture at PLOF s limitation. Goal status: New   5.  Patient will demonstrate/report ability to perform usual daily house/yard activity s restriction due to symptoms.    Goal status: New   6.  Patient will demonstrate bilateral SLS > 5 seconds to improve stability in ambulation.   Goal status: New       PLAN: PT FREQUENCY: 1-2x/week  PT DURATION: 10 weeks  PLANNED INTERVENTIONS: Therapeutic exercises, Therapeutic activity, Neuro Muscular re-education, Balance training, Gait training, Patient/Family education, Joint mobilization, Stair training, DME instructions, Dry Needling, Electrical stimulation, Cryotherapy, Moist heat, Taping, Traction Ultrasound, Ionotophoresis 77m/ml Dexamethasone, and Manual therapy.  All included unless contraindicated   PLAN FOR NEXT SESSION: DN as required.  Continue general strengthening and  postural control improvements.    MScot Jun PT, DPT, OCS, ATC 05/10/22  11:33 AM   PHYSICAL THERAPY DISCHARGE SUMMARY  Visits from Start of Care: 4  Current functional level related to goals / functional outcomes: See note   Remaining deficits: See note   Education / Equipment: HEP  Patient goals were  partially met. Patient is being discharged due to not returning since the last visit.  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   Esophageal reflux 02/10/2015   Fatigue    HTN (hypertension)    Hyperglycemia    preDM    Hyperlipidemia    Insomnia    Low back pain radiating to both legs 01/16/2017   Measles as a child   Overweight(278.02)    Peripheral neuropathy 08/07/2019   Personal history of colonic polyps 10/27/2012   Follows with [facility]   Pneumonia    " walking"   Preventative health care    Testosterone deficiency 05/23/2012   Wears glasses    Past Surgical History:  Procedure Laterality Date   ATRIAL FIBRILLATION ABLATION N/A 01/27/2022   Procedure: ATRIAL FIBRILLATION ABLATION;  Surgeon: Constance Haw, MD;  Location: [facility];  Service: Cardiovascular;  Laterality: N/A;   BACK SURGERY  2012 and 1994   Dr Margret Chance, screws and cage in low back   BIOPSY  01/09/2020   Procedure: BIOPSY;  Surgeon: Otis Brace, MD;  Location: [facility];  Service: Gastroenterology;;   BRONCHIAL BIOPSY  11/17/2020   Procedure: BRONCHIAL  BIOPSIES;  Surgeon: Laurin Coder, MD;  Location: [facility];  Service: Endoscopy;;   BRONCHIAL BRUSHINGS  11/17/2020   Procedure: BRONCHIAL BRUSHINGS;  Surgeon: Laurin Coder, MD;  Location: [facility];  Service: Endoscopy;;   BRONCHIAL WASHINGS  11/17/2020   Procedure: BRONCHIAL WASHINGS;  Surgeon: Laurin Coder, MD;  Location: [facility];  Service: Endoscopy;;   COLONOSCOPY WITH PROPOFOL N/A 01/09/2020   Procedure: COLONOSCOPY WITH PROPOFOL;  Surgeon: Otis Brace, MD;  Location: [facility];  Service: Gastroenterology;  Laterality: N/A;   ESOPHAGOGASTRODUODENOSCOPY (EGD) WITH PROPOFOL N/A 01/09/2020   Procedure: ESOPHAGOGASTRODUODENOSCOPY (EGD) WITH PROPOFOL;  Surgeon: Otis Brace, MD;  Location: [facility];  Service: Gastroenterology;  Laterality: N/A;   EYE SURGERY Bilateral    2016   HARDWARE REVISION  03/12/2019   REVISION OF HARDWARE THORACIC TEN-THORACIC ELEVEN WITH APPLICATION OF ADDITIONAL RODS AND ROD SLEEVES THORACIC EIGHT-THORACIC TWELVE 12-LUMBAR ONE (N/A )   HEMOSTASIS CONTROL  11/17/2020   Procedure: HEMOSTASIS CONTROL;  Surgeon: Laurin Coder, MD;  Location: [facility];  Service: Endoscopy;;   IR US GUIDE BX ASP/DRAIN  11/13/2020   LAMINECTOMY  02/12/2019   DECOMPRESSIVE LAMINECTOMY THORACIC NINE-THORACIC TEN AND THORACIC TEN-THORACIC ELEVEN, EXTENSION OF THORACOLUMBAR FUSION FROM THORACIC TEN TO THORACIC FIVE, LOCAL BONE GRAFT, ALLOGRAFT AND VIVIGEN (N/A)   POLYPECTOMY  01/09/2020   Procedure: POLYPECTOMY;  Surgeon: Otis Brace, MD;  Location: [facility];  Service: Gastroenterology;;   REMOVAL OF RODS AND PEDICLE SCREWS T5 AND T6, EXPLORATION OF FUSION, BIOPSY TRANSPEDICULAR T5 AND T6 (N/A )  09/14/2020   SPINAL FUSION N/A 02/12/2019   Procedure: DECOMPRESSIVE LAMINECTOMY THORACIC NINE-THORACIC TEN AND THORACIC TEN-THORACIC ELEVEN, EXTENSION OF THORACOLUMBAR  FUSION FROM THORACIC TEN TO THORACIC FIVE, LOCAL BONE GRAFT, ALLOGRAFT AND  VIVIGEN;  Surgeon: Jessy Oto, MD;  Location: [facility];  Service: Orthopedics;  Laterality: N/A;  DECOMPRESSIVE LAMINECTOMY THORACIC NINE-THORACIC TEN AND THORACIC TEN-THORACIC ELEVEN, EXTENSION OF TH   SPINAL FUSION N/A 03/12/2019   Procedure: REVISION OF HARDWARE THORACIC TEN-THORACIC ELEVEN WITH APPLICATION OF ADDITIONAL RODS AND ROD SLEEVES THORACIC EIGHT-THORACIC TWELVE 12-LUMBAR ONE;  Surgeon: Jessy Oto, MD;  Location: [facility];  Service: Orthopedics;  Laterality: N/A;   TONSILLECTOMY     as child   torn rotator cuff  2010   right   VIDEO BRONCHOSCOPY N/A 11/17/2020   Procedure: VIDEO BRONCHOSCOPY WITH FLUORO;  Surgeon: Laurin Coder, MD;  Location: [facility];  Service: Endoscopy;  Laterality: N/A;   Patient Active Problem List   Diagnosis Date Noted   Secondary hypercoagulable state ([place]) 03/16/2022   IPF (idiopathic pulmonary fibrosis) ([place]) 12/30/2021   Atrial fibrillation ([place]) 09/10/2021   Long term (current) use of anticoagulants 09/10/2021   Diabetes mellitus due to underlying condition with hyperosmolarity without coma, with long-term current use of insulin ([place]) 09/10/2021   Mixed hyperlipidemia 09/10/2021   Aspiration pneumonitis ([place]) 08/07/2021   Chronic respiratory failure with hypoxia ([place]) 08/07/2021   Pulmonary emboli ([place]) 07/16/2021   Bronchiectasis with acute exacerbation ([place]) 07/16/2021   History of DVT (deep vein thrombosis) 07/15/2021   Paroxysmal A-fib ([place]) 07/15/2021   COPD (chronic obstructive pulmonary disease) ([place]) 02/10/2021   Palpitations 02/01/2021   Wears glasses 01/11/2021   Pneumothorax, right 12/15/2020   Anxiety 12/01/2020   SOB (shortness of breath)    Nocturnal hypoxia 11/14/2020   S/P bronchoscopy 09/14/2020   Gastric ulcer 01/27/2020   H/O colonoscopy with polypectomy 01/27/2020   Protein-calorie malnutrition, severe 01/09/2020   Abdominal pain 12/30/2019   Weight loss, abnormal 12/30/2019   Change in bowel habits 12/30/2019    Medication monitoring encounter 08/26/2019   Peripheral neuropathy 08/07/2019   Hyponatremia 05/05/2019   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[place]) 04/04/2019   Fusion of spine of thoracolumbar region 03/12/2019   Status post thoracic spinal fusion 02/12/2019   Thoracic spinal stenosis 04/17/2017   Painful orthopaedic hardware ([place]) 04/17/2017   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 ([place]) 04/17/2017   Low back pain radiating to both legs 01/16/2017   Esophageal reflux 02/10/2015   Essential hypertension 06/25/2014   Asthma 06/25/2014   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   Overweight 06/25/2014   Other abnormal glucose 06/25/2014   Spinal stenosis of lumbar region without neurogenic claudication 04/09/2014   Flatback syndrome 04/09/2014   Chicken pox 10/21/2013   History of colonic polyps 10/27/2012   Testosterone deficiency 05/23/2012   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   Benign enlargement of prostate 04/23/2012   ED (erectile dysfunction) 04/23/2012   Allergic state 10/25/2011   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

## 2022-05-10 NOTE — Telephone Encounter (Signed)
See if we can offer appt for Thursday at 1pm virtual visit.  Lmom to call back.

## 2022-05-11 ENCOUNTER — Encounter: Payer: Self-pay | Admitting: *Deleted

## 2022-05-11 ENCOUNTER — Other Ambulatory Visit (HOSPITAL_COMMUNITY): Payer: Self-pay

## 2022-05-11 NOTE — Telephone Encounter (Signed)
Lmom for patient to call back 

## 2022-05-11 NOTE — Telephone Encounter (Signed)
Pt reached out to clinic stating that he would like to get started on medication and would like to pay for a 14-day supply of the medication to try it out. Per test claim, pt's copay for 28 capsules was $331. Advised pt that we happened to have a 25-day supply of the '150mg'$  capsules on hand, this was provided to pt's wife. Will f/u on financial doc submission.

## 2022-05-11 NOTE — Telephone Encounter (Signed)
Mychart message also sent to patient.

## 2022-05-12 ENCOUNTER — Encounter: Payer: Self-pay | Admitting: Family Medicine

## 2022-05-12 ENCOUNTER — Telehealth (INDEPENDENT_AMBULATORY_CARE_PROVIDER_SITE_OTHER): Payer: Medicare Other | Admitting: Family Medicine

## 2022-05-12 DIAGNOSIS — E782 Mixed hyperlipidemia: Secondary | ICD-10-CM | POA: Diagnosis not present

## 2022-05-12 DIAGNOSIS — G4700 Insomnia, unspecified: Secondary | ICD-10-CM | POA: Diagnosis not present

## 2022-05-12 DIAGNOSIS — I10 Essential (primary) hypertension: Secondary | ICD-10-CM | POA: Diagnosis not present

## 2022-05-12 DIAGNOSIS — J84112 Idiopathic pulmonary fibrosis: Secondary | ICD-10-CM | POA: Diagnosis not present

## 2022-05-12 DIAGNOSIS — F419 Anxiety disorder, unspecified: Secondary | ICD-10-CM

## 2022-05-12 DIAGNOSIS — Z794 Long term (current) use of insulin: Secondary | ICD-10-CM | POA: Diagnosis not present

## 2022-05-12 DIAGNOSIS — E0865 Diabetes mellitus due to underlying condition with hyperglycemia: Secondary | ICD-10-CM

## 2022-05-12 NOTE — Telephone Encounter (Signed)
Pt has appt at 1 today

## 2022-05-12 NOTE — Progress Notes (Signed)
MyChart Video Visit Virtual Visit via Video Note   This visit type was conducted due to national recommendations for restrictions regarding the COVID-19 Pandemic (e.g. social distancing) in an effort to limit this patient's exposure and mitigate transmission in our community. This patient is at least at moderate risk for complications without adequate follow up. This format is felt to be most appropriate for this patient at this time. Physical exam was limited by quality of the video and audio technology used for the visit. [name], CMA, was able to get the patient set up on a video visit.  Patient location: Patient at home and provider in visit Provider location: Office  I discussed the limitations of evaluation and management by telemedicine and the availability of in person appointments. The patient expressed understanding and agreed to proceed.  Visit Date: 05/12/2022  Today's healthcare provider: Danise Edge, MD     Subjective:    [name], male    DOB: [birth year], 76 y.o.   MRN: [id number]  Chief Complaint  Patient presents with   follow up medication change   HPI Patient is in today for a video visit.  Pain Management: He reports that he is unable to ween off his Oxycodone 5-325 mg due to his intense back pain. He states that he started dry needling two weeks ago which has helped reduce his pain.   Pulmonary Fibrosis: He reports that he has been taking Nintedanib 150 mg to manage his pulmonary fibrosis, but this has been causing him to feel anxious and fatigue.   Sleep: He currently takes 1 tablet of Buspirone 15 mg twice daily and 0.5-1 tablet of Zolpidem 10 mg nightly to help him sleep.   Past Surgical History:  Procedure Laterality Date   ATRIAL FIBRILLATION ABLATION N/A 01/27/2022   Procedure: ATRIAL FIBRILLATION ABLATION;  Surgeon: Regan Lemming, MD;  Location: [facility];  Service: Cardiovascular;  Laterality: N/A;   BACK SURGERY   2012 and 1994   Dr Charlesetta Garibaldi, screws and cage in low back   BIOPSY  01/09/2020   Procedure: BIOPSY;  Surgeon: Kathi Der, MD;  Location: [facility];  Service: Gastroenterology;;   BRONCHIAL BIOPSY  11/17/2020   Procedure: BRONCHIAL BIOPSIES;  Surgeon: Tomma Lightning, MD;  Location: [facility];  Service: Endoscopy;;   BRONCHIAL BRUSHINGS  11/17/2020   Procedure: BRONCHIAL BRUSHINGS;  Surgeon: Tomma Lightning, MD;  Location: [facility];  Service: Endoscopy;;   BRONCHIAL WASHINGS  11/17/2020   Procedure: BRONCHIAL WASHINGS;  Surgeon: Tomma Lightning, MD;  Location: [facility];  Service: Endoscopy;;   COLONOSCOPY WITH PROPOFOL N/A 01/09/2020   Procedure: COLONOSCOPY WITH PROPOFOL;  Surgeon: Kathi Der, MD;  Location: [facility];  Service: Gastroenterology;  Laterality: N/A;   ESOPHAGOGASTRODUODENOSCOPY (EGD) WITH PROPOFOL N/A 01/09/2020   Procedure: ESOPHAGOGASTRODUODENOSCOPY (EGD) WITH PROPOFOL;  Surgeon: Kathi Der, MD;  Location: [facility];  Service: Gastroenterology;  Laterality: N/A;   EYE SURGERY Bilateral    2016   HARDWARE REVISION  03/12/2019   REVISION OF HARDWARE THORACIC TEN-THORACIC ELEVEN WITH APPLICATION OF ADDITIONAL RODS AND ROD SLEEVES THORACIC EIGHT-THORACIC TWELVE 12-LUMBAR ONE (N/A )   HEMOSTASIS CONTROL  11/17/2020   Procedure: HEMOSTASIS CONTROL;  Surgeon: Tomma Lightning, MD;  Location: [facility];  Service: Endoscopy;;   IR US GUIDE BX ASP/DRAIN  11/13/2020   LAMINECTOMY  02/12/2019   DECOMPRESSIVE LAMINECTOMY THORACIC NINE-THORACIC TEN AND THORACIC TEN-THORACIC ELEVEN, EXTENSION OF THORACOLUMBAR FUSION FROM THORACIC TEN TO THORACIC  FIVE, LOCAL BONE GRAFT, ALLOGRAFT AND VIVIGEN (N/A)   POLYPECTOMY  01/09/2020   Procedure: POLYPECTOMY;  Surgeon: Otis Brace, MD;  Location: [facility];  Service: Gastroenterology;;   REMOVAL OF RODS AND PEDICLE SCREWS T5 AND T6, EXPLORATION OF FUSION, BIOPSY TRANSPEDICULAR T5 AND T6 (N/A )   09/14/2020   SPINAL FUSION N/A 02/12/2019   Procedure: DECOMPRESSIVE LAMINECTOMY THORACIC NINE-THORACIC TEN AND THORACIC TEN-THORACIC ELEVEN, EXTENSION OF THORACOLUMBAR FUSION FROM THORACIC TEN TO THORACIC FIVE, LOCAL BONE GRAFT, ALLOGRAFT AND VIVIGEN;  Surgeon: Jessy Oto, MD;  Location: [place];  Service: Orthopedics;  Laterality: N/A;  DECOMPRESSIVE LAMINECTOMY THORACIC NINE-THORACIC TEN AND THORACIC TEN-THORACIC ELEVEN, EXTENSION OF TH   SPINAL FUSION N/A 03/12/2019   Procedure: REVISION OF HARDWARE THORACIC TEN-THORACIC ELEVEN WITH APPLICATION OF ADDITIONAL RODS AND ROD SLEEVES THORACIC EIGHT-THORACIC TWELVE 12-LUMBAR ONE;  Surgeon: Jessy Oto, MD;  Location: [facility];  Service: Orthopedics;  Laterality: N/A;   TONSILLECTOMY     as child   torn rotator cuff  2010   right   VIDEO BRONCHOSCOPY N/A 11/17/2020   Procedure: VIDEO BRONCHOSCOPY WITH FLUORO;  Surgeon: Laurin Coder, MD;  Location: [facility];  Service: Endoscopy;  Laterality: N/A;   Family History  Problem Relation Age of Onset   Hypertension Mother    Diabetes Mother        type 2   Cancer Mother 67       breast in remission   Emphysema Father        smoker   COPD Father        smoker   Stroke Father 47       mini   Heart disease Father    Hypertension Sister    Hyperlipidemia Sister    Scoliosis Sister    Osteoporosis Sister    Hypertension Maternal Grandmother    Scoliosis Maternal Grandmother    Heart disease Maternal Grandfather    Heart disease Paternal Grandfather        smoker   Heart disease Daughter    Social History   Socioeconomic History   Marital status: Married    Spouse name: Not on file   Number of children: Not on file   Years of education: Not on file   Highest education level: Not on file  Occupational History   Not on file  Tobacco Use   Smoking status: Former    Packs/day: 1.00    Years: 20.00    Total pack years: 20.00    Types: Cigarettes    Quit date: 07/18/1989    Years  since quitting: 32.8   Smokeless tobacco: Never   Tobacco comments:    Former smoker 03/16/22  Vaping Use   Vaping Use: Never used  Substance and Sexual Activity   Alcohol use: Yes    Alcohol/week: 2.0 standard drinks of alcohol    Types: 2 Standard drinks or equivalent per week    Comment: couple beers here and there 03/16/22   Drug use: No   Sexual activity: Yes    Comment: lives with wife, still working, no dietary restrictions, continues to exercise intermittently  Other Topics Concern   Not on file  Social History Narrative   Not on file   Social Determinants of Health   Financial Resource Strain: Low Risk  (01/27/2021)   Overall Financial Resource Strain (CARDIA)    Difficulty of Paying Living Expenses: Not hard at all  Food Insecurity: No Food Insecurity (01/27/2021)   Hunger  Vital Sign    Worried About Programme researcher, broadcasting/film/video in the Last Year: Never true    Ran Out of Food in the Last Year: Never true  Transportation Needs: No Transportation Needs (01/27/2021)   PRAPARE - Administrator, Civil Service (Medical): No    Lack of Transportation (Non-Medical): No  Physical Activity: Sufficiently Active (01/27/2021)   Exercise Vital Sign    Days of Exercise per Week: 7 days    Minutes of Exercise per Session: 30 min  Stress: No Stress Concern Present (01/27/2021)   [employer] of Occupational Health - Occupational Stress Questionnaire    Feeling of Stress : Not at all  Social Connections: Moderately Integrated (01/27/2021)   Social Connection and Isolation Panel [NHANES]    Frequency of Communication with Friends and Family: More than three times a week    Frequency of Social Gatherings with Friends and Family: More than three times a week    Attends Religious Services: More than 4 times per year    Active Member of [employer] or Organizations: No    Attends Banker Meetings: Never    Marital Status: Married  Catering manager Violence: Not At Risk  (01/27/2021)   Humiliation, Afraid, Rape, and Kick questionnaire    Fear of Current or Ex-Partner: No    Emotionally Abused: No    Physically Abused: No    Sexually Abused: No   Outpatient Medications Prior to Visit  Medication Sig Dispense Refill   albuterol (PROVENTIL HFA) 108 (90 Base) MCG/ACT inhaler TAKE 2 PUFFS BY MOUTH EVERY 6 HOURS AS NEEDED FOR WHEEZE OR SHORTNESS OF BREATH 8 g 5   atorvastatin (LIPITOR) 10 MG tablet TAKE 1 TABLET BY MOUTH EVERY DAY 90 tablet 1   busPIRone (BUSPAR) 15 MG tablet Take 1 tablet (15 mg total) by mouth 2 (two) times daily. 60 tablet 0   dextromethorphan-guaiFENesin (MUCINEX DM) 30-600 MG 12hr tablet Take 1 tablet by mouth 2 (two) times daily.     famciclovir (FAMVIR) 500 MG tablet Take 1 tablet (500 mg total) by mouth 3 (three) times daily. (Patient taking differently: Take 500 mg by mouth 3 (three) times daily. As needed) 21 tablet 0   fluticasone (FLONASE) 50 MCG/ACT nasal spray SPRAY 2 SPRAYS INTO EACH NOSTRIL EVERY DAY 48 mL 1   Fluticasone-Umeclidin-Vilant (TRELEGY ELLIPTA) 200-62.5-25 MCG/ACT AEPB Inhale 1 puff into the lungs daily. 60 each 5   glucose blood (CONTOUR NEXT TEST) test strip Check blood sugar once daily 100 each 12   insulin glargine (LANTUS) 100 UNIT/ML Solostar Pen Inject 10 Units into the skin daily. 15 mL 0   Insulin Pen Needle (B-D UF III MINI PEN NEEDLES) 31G X 5 MM MISC USE WITH LANTUS 100 each 0   metFORMIN (GLUCOPHAGE) 1000 MG tablet TAKE 1 TABLET (1,000 MG TOTAL) BY MOUTH TWICE A DAY WITH FOOD 180 tablet 0   Nintedanib (OFEV) 150 MG CAPS Take 150 mg by mouth 2 (two) times daily.     oxyCODONE-acetaminophen (PERCOCET/ROXICET) 5-325 MG tablet Take 1 tablet by mouth every 4 (four) hours as needed for moderate pain.     pantoprazole (PROTONIX) 40 MG tablet TAKE 1 TABLET BY MOUTH TWICE A DAY BEFORE A MEAL 180 tablet 1   predniSONE (DELTASONE) 10 MG tablet TAKE 1 TABLET (10 MG TOTAL) BY MOUTH DAILY WITH BREAKFAST. (Patient taking  differently: Take 10 mg by mouth daily with breakfast. Taking 1/2 tablet daily) 30 tablet 3  pregabalin (LYRICA) 75 MG capsule TAKE 1 CAPSULE BY MOUTH TWICE A DAY (Patient taking differently: Take 75 mg by mouth 2 (two) times daily.) 60 capsule 3   sitaGLIPtin (JANUVIA) 50 MG tablet Take 1 tablet (50 mg total) by mouth daily. 30 tablet 3   STIOLTO RESPIMAT 2.5-2.5 MCG/ACT AERS Inhale 2 puffs into the lungs daily. 1 each 5   tamsulosin (FLOMAX) 0.4 MG CAPS capsule Take 2 capsules (0.8 mg total) by mouth daily. (Patient taking differently: Take 0.8 mg by mouth at bedtime.) 180 capsule 3   tiZANidine (ZANAFLEX) 4 MG tablet TAKE 1 TABLET BY MOUTH EVERY 6 HOURS AS NEEDED 360 tablet 1   venlafaxine XR (EFFEXOR-XR) 150 MG 24 hr capsule Take 1 capsule (150 mg total) by mouth daily with breakfast. 90 capsule 0   warfarin (COUMADIN) 5 MG tablet TAKE 1 TO 2 TABLETS DAILY OR AS PRESCRIBED BY COUMADIN CLINIC 130 tablet 1   zolpidem (AMBIEN) 10 MG tablet Take 0.5-1 tablets (5-10 mg total) by mouth at bedtime as needed for sleep. 15 tablet 1   colchicine 0.6 MG tablet Take 1 tablet (0.6 mg total) by mouth 2 (two) times daily for 14 days. 28 tablet 0   Facility-Administered Medications Prior to Visit  Medication Dose Route Frequency Provider Last Rate Last Admin   testosterone cypionate (DEPOTESTOSTERONE CYPIONATE) injection 200 mg  200 mg Intramuscular Q14 Days Saguier, Percell Miller, PA-C   200 mg at 07/06/21 3329   Allergies  Allergen Reactions   Daptomycin     Drug induced pneumonia    Diltiazem Other (See Comments)    Passed out   ROS    Objective:    Physical Exam  There were no vitals taken for this visit. Wt Readings from Last 3 Encounters:  05/05/22 184 lb (83.5 kg)  04/27/22 186 lb 12.8 oz (84.7 kg)  04/25/22 182 lb (82.6 kg)   Diabetic Foot Exam - Simple   No data filed    Lab Results  Component Value Date   WBC CANCELED 04/25/2022   HGB 12.1 (L) 03/15/2022   HCT 35.5 (L) 03/15/2022    PLT 342.0 03/15/2022   GLUCOSE 159 (H) 03/23/2022   CHOL 171 03/15/2022   TRIG 104.0 03/15/2022   HDL 79.80 03/15/2022   LDLDIRECT 64.0 10/30/2017   LDLCALC 70 03/15/2022   ALT 15 03/23/2022   AST 21 03/23/2022   NA 129 (L) 03/23/2022   K 4.4 03/23/2022   CL 91 (L) 03/23/2022   CREATININE 0.78 03/23/2022   BUN 13 03/23/2022   CO2 30 03/23/2022   TSH 2.16 03/15/2022   PSA 0.65 10/08/2019   INR 2.0 05/04/2022   HGBA1C 9.0 (H) 03/15/2022   MICROALBUR 0.61 04/23/2012   Lab Results  Component Value Date   TSH 2.16 03/15/2022   Lab Results  Component Value Date   WBC CANCELED 04/25/2022   HGB 12.1 (L) 03/15/2022   HCT 35.5 (L) 03/15/2022   MCV 88.4 03/15/2022   PLT 342.0 03/15/2022   Lab Results  Component Value Date   NA 129 (L) 03/23/2022   K 4.4 03/23/2022   CO2 30 03/23/2022   GLUCOSE 159 (H) 03/23/2022   BUN 13 03/23/2022   CREATININE 0.78 03/23/2022   BILITOT 0.7 03/23/2022   ALKPHOS 90 03/23/2022   AST 21 03/23/2022   ALT 15 03/23/2022   PROT 6.0 03/23/2022   ALBUMIN 4.1 03/23/2022   CALCIUM 9.2 03/23/2022   ANIONGAP 9 10/05/2021   EGFR 92  01/25/2022   GFR 85.38 03/23/2022   Lab Results  Component Value Date   CHOL 171 03/15/2022   Lab Results  Component Value Date   HDL 79.80 03/15/2022   Lab Results  Component Value Date   LDLCALC 70 03/15/2022   Lab Results  Component Value Date   TRIG 104.0 03/15/2022   Lab Results  Component Value Date   CHOLHDL 2 03/15/2022   Lab Results  Component Value Date   HGBA1C 9.0 (H) 03/15/2022      Assessment & Plan:   Problem List Items Addressed This Visit     Essential hypertension    Monitor and report any concerns, no changes to meds. Encouraged heart healthy diet such as the DASH diet and exercise as tolerated.       Diabetes mellitus with hyperglycemia (HCC)    hgba1c acceptable, minimize simple carbs. Increase exercise as tolerated. Continue current meds      Insomnia    Encouraged good  sleep hygiene such as dark, quiet room. No blue/green glowing lights such as computer screens in bedroom. No alcohol or stimulants in evening. Cut down on caffeine as able. Regular exercise is helpful but not just prior to bed time. Ambien prn      Anxiety    His buspar was increased to 15 mg bid and it has helped. He is advised we can consider tid dosing. Continue Venlafaxine at same dosing.       Mixed hyperlipidemia    encouraged heart healthy diet, avoid trans fats, minimize simple carbs and saturated fats. Increase exercise as tolerated      IPF (idiopathic pulmonary fibrosis) ([place])    He has been struggling with anxiety flares since he was diagnosed with IPF. He is following closely with pulmonoloyg      Relevant Medications   Nintedanib (OFEV) 150 MG CAPS   No orders of the defined types were placed in this encounter.  I discussed the assessment and treatment plan with the patient. The patient was provided an opportunity to ask questions and all were answered. The patient agreed with the plan and demonstrated an understanding of the instructions.   The patient was advised to call back or seek an in-person evaluation if the symptoms worsen or if the condition fails to improve as anticipated.  I provided 20 minutes of face-to-face time during this encounter.  I,Mohammed Iqbal,acting as a scribe for Penni Homans, MD.,have documented all relevant documentation on the behalf of Penni Homans, MD,as directed by  Penni Homans, MD while in the presence of Penni Homans, MD.  Penni Homans, MD [facility] [phone] (phone) [phone] (fax)  [place]

## 2022-05-13 ENCOUNTER — Encounter: Payer: Medicare Other | Admitting: Rehabilitative and Restorative Service Providers"

## 2022-05-13 NOTE — Telephone Encounter (Signed)
ATC and LVM for pt providing update with financial docs, direct office number provided if he needs to reach out to me. Otherwise I advised him to either mail in the first two pages of [phone] or come drop off at the clinic and state that it's for the pharmacy team. Will await f/u.

## 2022-05-15 NOTE — Assessment & Plan Note (Addendum)
encouraged heart healthy diet, avoid trans fats, minimize simple carbs and saturated fats. Increase exercise as tolerated 

## 2022-05-15 NOTE — Assessment & Plan Note (Signed)
He has been struggling with anxiety flares since he was diagnosed with IPF. He is following closely with pulmonoloyg

## 2022-05-15 NOTE — Assessment & Plan Note (Signed)
Monitor and report any concerns, no changes to meds. Encouraged heart healthy diet such as the DASH diet and exercise as tolerated.  ?

## 2022-05-15 NOTE — Assessment & Plan Note (Signed)
His buspar was increased to 15 mg bid and it has helped. He is advised we can consider tid dosing. Continue Venlafaxine at same dosing.

## 2022-05-15 NOTE — Assessment & Plan Note (Signed)
hgba1c acceptable, minimize simple carbs. Increase exercise as tolerated. Continue current meds 

## 2022-05-15 NOTE — Assessment & Plan Note (Signed)
Encouraged good sleep hygiene such as dark, quiet room. No blue/green glowing lights such as computer screens in bedroom. No alcohol or stimulants in evening. Cut down on caffeine as able. Regular exercise is helpful but not just prior to bed time.  Ambien prn 

## 2022-05-18 ENCOUNTER — Other Ambulatory Visit: Payer: Self-pay | Admitting: Medical

## 2022-05-19 ENCOUNTER — Other Ambulatory Visit (INDEPENDENT_AMBULATORY_CARE_PROVIDER_SITE_OTHER): Payer: Medicare Other

## 2022-05-19 ENCOUNTER — Encounter: Payer: Self-pay | Admitting: Internal Medicine

## 2022-05-19 ENCOUNTER — Ambulatory Visit: Payer: Medicare Other | Admitting: Internal Medicine

## 2022-05-19 VITALS — BP 132/70 | HR 62 | Temp 98.1°F | Ht 70.0 in | Wt 187.6 lb

## 2022-05-19 DIAGNOSIS — Z5181 Encounter for therapeutic drug level monitoring: Secondary | ICD-10-CM | POA: Diagnosis not present

## 2022-05-19 DIAGNOSIS — J439 Emphysema, unspecified: Secondary | ICD-10-CM | POA: Diagnosis not present

## 2022-05-19 DIAGNOSIS — J84112 Idiopathic pulmonary fibrosis: Secondary | ICD-10-CM

## 2022-05-19 LAB — HEPATIC FUNCTION PANEL
ALT: 18 U/L (ref 0–53)
AST: 24 U/L (ref 0–37)
Albumin: 4.5 g/dL (ref 3.5–5.2)
Alkaline Phosphatase: 88 U/L (ref 39–117)
Bilirubin, Direct: 0.1 mg/dL (ref 0.0–0.3)
Total Bilirubin: 0.7 mg/dL (ref 0.2–1.2)
Total Protein: 6.5 g/dL (ref 6.0–8.3)

## 2022-05-19 NOTE — Progress Notes (Signed)
Referring provider: Elise Benne  HPI: 76 year old male, former smoker (20-pack-year history).  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   Previous [facility] pulmonary encounters:  12/29/20- Dr. Chase Caller Chief Complaint  Patient presents with   Follow-up    PFT  performed 5/24.  Pt states since last visit, he has been doing okay. States he has lost some weight that he has noticed since last visit.    Follow-up drug-induced pneumonitis daptomycin from early May 2022  HPI KOI ZANGARA 76 y.o. -returns for follow-up.  I personally saw him in the hospital on 11/20/2020.  At that point in time he had been started on Solu-Medrol on 11/18/2020.  When I saw him he was on day 3 of Solu-Medrol and he was needing 3 L nasal cannula oxygen at rest.  At that point in time even with 3 L he was desaturating to 86% just talking.  Put him on prednisone 60 mg/day.  After that he see nurse practitioner x2 at least 1 of which was a telephone visit..  As part of his follow-up on 12/11/2020 he had a CT scan of the chest [3 days after pulmonary function test] and on my personal visualization it showed new onset pneumothorax but significant improvement in infiltrates and inflammation.  He had another follow-up chest x-ray 12/17/2020 in which no pneumothorax reported [similar on 12/15/2020] and pulmonary infiltrates appear improved.  He is now here with his wife  He tells me he is now on 10 mg/day prednisone he just darted that today.  He is 1 week left and after that the prednisone taper ends.  Because of the pneumothorax is physical therapy has been on hold but 4 days ago he was evaluated and apparently his pulse ox was 100% but this was on oxygen.  He continues to use his  oxygen although he is not sure if he is desaturating anymore.  He does feel overall improved compared to 6 weeks ago but still has significant amount of fatigue.  He has significant insomnia which he believes is due to prednisone.  Today's last night was the first night he could sleep when he dropped his prednisone down to 10 mg/day.  He believes his fatigue is multifactorial related to the disease, multiple medications, lack of oxygen, insomnia etc.  02/09/2021 Patient presents today for 1 month follow-up. Since his last visit in June, he has established with cardiology and was dx with afib. He is currently on Xarelto and Digoxin. His wife states that he has had more energy recently. He is scheduled for testosterone injection tomorrow. He gets winded with stairs and heavy exertion. He reports intermittent wheezing. He needs refill of albuterol rescue inhaler. He has weaned himself of oxygen and O2 equipment has been picked up. He has been sleeping better at night. Anxiety has improved, he stopped xanax. Due for follow-up CXR today. 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  02/23/2021 Patient presents today for 2 week follow-up. During his last visit he was given trial Spiriva Respimat d/t dyspnea /wheezing symptoms and moderate obstruction seen on his PFTs. He has been using sample of Spiriva Respimat 2.78mg as prescribed, he occasionally misses a dose for 1-2 days. He states that his wheezing has stopped but he has not noticed a significant difference in  dyspnea symptoms. His ILD scale appears improved from two weeks go. He has having some muscle spasms in his back that started before being placed on LABA.   04/26/2021 -   Chief Complaint  Patient presents with   Follow-up    F/u, trelegy medication concerns    GUENTHER DUNSHEE 76 y.o. -returns for follow-up.  Since I last saw him he seen nurse practitioner 2 times.  Symptom scores for  dyspnea continue to improve.  He was also walked for oxygen desaturation February 04, 2019 did not desaturate.  He says he continues to get better.  He is off prednisone.  Nurse practitioner noticed obstructive physiology on the pulmonary function test.  Put him on inhaler.  Currently is on Trelegy.  He says it helps him tremendously.  He feels it opens up his lungs.  He declined his flu shot today because he will have it after the COVID-vaccine in October or November 2022.   06/24/2021- Interim hx  Patient presents today for acute OV/cough. Patient reports increased shortness of breath since 06/13/21 with associated fatigue, cough and chest tightness. Cough is productive with clear mucus. Symptoms started after Thanksgiving. His wife had the flu at the same time. His cough initially improved while taking mucinex, he stopped since he was starting to feel better. Denies f/c/s, purulent mucus, N/V/D. CAT score 17.   Inpatient consult note 07/17/2022 by Dr. Chase Caller at [facility] This is a 76 year old male with poorly controlled diabetes, hemoglobin A1c 7.4, chronic opioid use for back pain, BPH on Flomax, anxiety and depression, paroxysmal A. fib on metoprolol digoxin and Xarelto .  He had long hx of back infection and was on daptomycin.  Then in  spring 2022 was admitted at [facility] with R pneumonitis considered to be daptomycin related pneumonitis.  Was treated with steroids.  Course complicated by right upper extremity DVT secondary to PICC line for which she was on Xarelto.  He is requiring oxygen at that time.  By the summer 2022 he was off oxygen and able to ambulate even without oxygen.  Course was also complicated later in May 2022 with spontaneous pneumothorax.  By the summer 2022 he was able to come off prednisone.  He also had diagnosed of atrial fibrillation for which he was on digoxin and Xarelto was continued [although originally started for DVT].  Last seen in clinic October  2022 at which time he was off oxygen and off steroids.  He was on Trelegy inhaler for pulmonary function test that back in May 2022 showed obstructive physiology.  He says he was feeling better.  He is only minimally symptomatic.  Plan at that time was to get a repeat high-resolution CT chest done in the spring 2023.   On Thanksgiving 2022 his wife had the flu and he was exposed to this and after that he started having increasing cough and fatigue.  Up until this point he was able to do walking and fishing.  He saw  pulm nurse practitioner 06/24/2021 in the pulmonary clinic.  Was given 8-day prednisone taper for presumed diagnosis of COPD exacerbation.  No antibiotic.  He did see infectious diseases at the same time and for his back infections it was noted, "10 days of linezolid + 8 weeks of tedizolid for his spinal infection and has now been started on dalbavancin infusions every 2 weeks last month as a suppressive regim" (none of which appear to be associated with drug-induced ILD]  He then called into the pulmonary office on 07/13/2021 felt he was desaturating mildly and coughing up green mucus and fevers..  2 COVID home test were negative.  He had a fever of 100.7.  He wanted some prednisone.  We gave him some Omnicef as well and a 5-day prednisone taper but also advised that to report to the ER.  He then presented on 07/15/2021 for admission with acute hypoxic respiratory failure 58% and a history of 1 week of cough and shortness of breath getting worse.  Appears he might not have taken Omnicef.  He had lactic acidosis 2.3 at admission, white count of nearly 15,000 with lymphopenia.  CT angiogram showed acute PE segmental and subsegmental right-sided [despite being on Xarelto] started on IV heparin started on Solu-Medrol and antibiotics . CT also showed L >>> R (opposted from may 2022) GGO. and .  His Doppler ultrasound lower extremities negative for DVT.   On 07/16/2021 he was stable on 4 L oxygen.   His flu, multiplex PCR and MRSA PCR were negative.  Procalcitonin 0.21.  Echo is normal with normal RV.  Then on 12/3 1/22 seen progressively more hypoxemic now requiring 8 L of oxygen.  Briefly required even more but looking comfortable.  When assisted to go to the bathroom even on 8 L he desaturated to 70s he has been afebrile since admission. Trop improved from 200  prior day -> 70, lactic 1.9 with normal BNP. CXR with ?> Worsening LEft sided air space disease and ccm consulted      OV 08/12/2021  Subjective:  [name], male , DOB: Apr 30, 1946 , age 58 y.o. , MRN: [id number] , ADDRESS: [place] [id number] PCP Mackie Pai, PA-C Patient Care Team: Saguier, Iris Pert as PCP - General (Internal Medicine) Berniece Salines, DO as PCP - Cardiology (Cardiology) Keene Breath., MD (Ophthalmology) Jessy Oto, MD as Consulting Physician (Orthopedic Surgery)  This Provider for this visit: Treatment Team:  Attending Provider: Brand Males, MD    08/12/2021 -   Chief Complaint  Patient presents with   Hospitalization Follow-up    Breathing has improved some but not back to baseline yet. He has some chest congestion and occ cough with clear to yellow sputum.    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  He also has paroxysmal A. fib, low testosterone, chronic hyponatremia, chronic opioid intake.,  Chronic Lyrica  HPI LORENCE NAGENGAST 76 y.o. -returns for office follow-up.  After last saw him in the office I did actually see him New Year's Eve in the hospital with acute on chronic hypoxemic respiratory failure at this admission he had a new diagnosis of pulmonary embolism that was deemed Xarelto failure [he was on Xarelto for DVT in his upper extremity from spring 2022.  Today I learned that he is actually taking testosterone for some years.  When he had the DVT in the spring 2022 after the discharge he was  on testosterone.  He says he took testosterone right up to the admission.  He is now getting subcutaneous Lovenox injections.  He wants to go to Coumadin because has been deemed as Xarelto failure.  Also prior to that admission he did have some infectious symptoms with colored sputum.  At the time of that admission he denies any aspiration.  He was discharged home on oxygen.  He was discharged on a prednisone taper.  Also discharged on oxygen [of note  he was on 10 L oxygen during the hospital stay]  At home he continued on oxygen was doing the prednisone taper but got readmitted 08/07/2021 for 1 day and discharged the following day 08/08/2021 following aspiration of a pill.  He says he has never had any aspiration.  He also and his wife also states that speech eval for swallow in the hospital was normal.  He denies any other choking episodes for pills or for food.  He was treated with antibiotics.  This time at discharge he was only on 2 L nasal cannula.  He was given another prednisone taper.  He tells me that on 14 August 2021 he will go to 30 mg/day for 10 days and then 20 mg daily for 10 days and then 10 mg daily for 10 days and stop   Walking desaturation test today on room air: At room air at rest his pulse ox was 95% for at least 20 minutes.  Then we decided to walk him 185 feet x 3 laps on room air:.  He only walked 1 lap and his pulse ox went down to 86% on room air and stopped.  His heart rate went up to 122/min.  His most recent chest x-ray was 5 days ago on 08/07/2021 at the time of the hospitalization second time for aspirating pill: This is improved compared to the admission 1.   His current symptom score is listed below.    OV 09/28/2021  Subjective:  [name], male , DOB: 1945-08-20 , age 76 y.o. , MRN: [id number] , ADDRESS: [place] [id number] PCP Mackie Pai, PA-C Patient Care Team: Saguier, Iris Pert as PCP - General (Internal  Medicine) Berniece Salines, DO as PCP - Cardiology (Cardiology) Keene Breath., MD (Ophthalmology) Jessy Oto, MD as Consulting Physician (Orthopedic Surgery)  This Provider for this visit: Treatment Team:  Attending Provider: Brand Males, MD    09/28/2021 -   Chief Complaint  Patient presents with   Follow-up    Pt states he still becomes SOB with exertion and also has an occasional cough. States the cough happens throughout the day and will get some phlegm up that is clear to yellow tinge.   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 And PE with "ILD Falare" Dec 2022/Jan 2023   He also has paroxysmal A. fib, low testosterone, chronic hyponatremia, chronic opioid intake.,  Chronic Lyrica  HPI DERAN BARRO 76 y.o. -returns for follow-up.  Since his last visit he has continued on prednisone 10 mg/day.  He continues to have shortness of breath.  In fact this is unchanged.  He does tell me though starting spring season he has had allergic rhinitis.  Wife states he has seasonal allergies for many years.  He is also having worsening cough.  He took a antihistamine and is somewhat better.  He continues to schedule Stiolto.  He has upcoming atrial fibrillation ablation end of March 2023.  Also in mid February 2023 had another CT angiogram chest that ruled out pulmonary embolism [done while on anticoagulation].  I personally visualized the CT chest and compared to late December 2023/early January 2023 the findings are better.  There might be some residual scarring and potential emphysema.  We walked him today and his walking desaturation test held up.  He did not desaturate.  There is a huge improvement.  Of note he was supposed have pulmonary function test today but  this was not performed.  He does not know why.  Apologized for any missteps.    For his pulmonary embolism: It appears that he might not be taking his testosterone shots  anymore.  Regarding seasonal allergies: He has not had work-up for this.  But currently it is active.     '@Patient'$  ID: Luis Brown, male    DOB: [birth year], 76 y.o.   MRN: [id number]  Chief Complaint  Patient presents with   Follow-up    C/o increased sob x 2 wks., cough-yellow in am    Referring provider: Elise Benne  HPI: 76 year old male, former smoker (20-pack-year history).  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   Previous [facility] pulmonary encounters:  12/29/20- Dr. Chase Caller Chief Complaint  Patient presents with   Follow-up    PFT  performed 5/24.  Pt states since last visit, he has been doing okay. States he has lost some weight that he has noticed since last visit.    Follow-up drug-induced pneumonitis daptomycin from early May 2022  HPI JAREB RADONCIC 76 y.o. -returns for follow-up.  I personally saw him in the hospital on 11/20/2020.  At that point in time he had been started on Solu-Medrol on 11/18/2020.  When I saw him he was on day 3 of Solu-Medrol and he was needing 3 L nasal cannula oxygen at rest.  At that point in time even with 3 L he was desaturating to 86% just talking.  Put him on prednisone 60 mg/day.  After that he see nurse practitioner x2 at least 1 of which was a telephone visit..  As part of his follow-up on 12/11/2020 he had a CT scan of the chest [3 days after pulmonary function test] and on my personal visualization it showed new onset pneumothorax but significant improvement in infiltrates and inflammation.  He had another follow-up chest x-ray 12/17/2020 in which no pneumothorax reported [similar on 12/15/2020] and pulmonary infiltrates appear improved.  He is now here with his wife  He tells me he is now on  10 mg/day prednisone he just darted that today.  He is 1 week left and after that the prednisone taper ends.  Because of the pneumothorax is physical therapy has been on hold but 4 days ago he was evaluated and apparently his pulse ox was 100% but this was on oxygen.  He continues to use his oxygen although he is not sure if he is desaturating anymore.  He does feel overall improved compared to 6 weeks ago but still has significant amount of fatigue.  He has significant insomnia which he believes is due to prednisone.  Today's last night was the first night he could sleep when he dropped his prednisone down to 10 mg/day.  He believes his fatigue is multifactorial related to the disease, multiple medications, lack of oxygen, insomnia etc.  02/09/2021 Patient presents today for 1 month follow-up. Since his last visit in June, he has established with cardiology and was dx with afib. He is currently on Xarelto and Digoxin. His wife states that he has had more energy recently. He is scheduled for testosterone injection tomorrow. He gets winded with stairs and heavy exertion. He reports intermittent wheezing. He needs refill of albuterol rescue inhaler. He has weaned himself of oxygen and O2 equipment has been picked up. He has been  sleeping better at night. Anxiety has improved, he stopped xanax. Due for follow-up CXR today. 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  02/23/2021 Patient presents today for 2 week follow-up. During his last visit he was given trial Spiriva Respimat d/t dyspnea /wheezing symptoms and moderate obstruction seen on his PFTs. He has been using sample of Spiriva Respimat 2.81mg as prescribed, he occasionally misses a dose for 1-2 days. He states that his wheezing has stopped but he has not noticed a significant difference in dyspnea symptoms. His ILD scale appears improved from two weeks go. He has having some muscle spasms in his  back that started before being placed on LABA.   04/26/2021 -   Chief Complaint  Patient presents with   Follow-up    F/u, trelegy medication concerns    CDAVEY LIMAS89y.o. -returns for follow-up.  Since I last saw him he seen nurse practitioner 2 times.  Symptom scores for dyspnea continue to improve.  He was also walked for oxygen desaturation February 04, 2019 did not desaturate.  He says he continues to get better.  He is off prednisone.  Nurse practitioner noticed obstructive physiology on the pulmonary function test.  Put him on inhaler.  Currently is on Trelegy.  He says it helps him tremendously.  He feels it opens up his lungs.  He declined his flu shot today because he will have it after the COVID-vaccine in October or November 2022.  06/24/2021 Patient presents today for acute OV/cough. Patient reports increased shortness of breath since 06/13/21 with associated fatigue, cough and chest tightness. Cough is productive with clear mucus. Symptoms started after Thanksgiving. His wife had the flu at the same time. His cough initially improved while taking mucinex, he stopped since he was starting to feel better. Denies f/c/s, purulent mucus, N/V/D. CAT score 17.    09/28/2021 -   Chief Complaint  Patient presents with   Follow-up    Pt states he still becomes SOB with exertion and also has an occasional cough. States the cough happens throughout the day and will get some phlegm up that is clear to yellow tinge.   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 And PE with "ILD Falare" Dec 2022/Jan 2023   He also has paroxysmal A. fib, low testosterone, chronic hyponatremia, chronic opioid intake.,  Chronic Lyrica  HPI CKIRILL CHATTERJEE89y.o. -returns for follow-up.  Since his last visit he has continued on prednisone 10 mg/day.  He continues to have shortness of breath.  In fact this is unchanged.  He does tell me though starting spring season he  has had allergic rhinitis.  Wife states he has seasonal allergies for many years.  He is also having worsening cough.  He took a antihistamine and is somewhat better.  He continues to schedule Stiolto.  He has upcoming atrial fibrillation ablation end of March 2023.  Also in mid February 2023 had another CT angiogram chest that ruled out pulmonary embolism [done while on anticoagulation].  I personally visualized the CT chest and compared to late December 2023/early January 2023 the findings are better.  There might be some residual scarring and potential emphysema.  We walked him today and his walking desaturation test held up.  He did not desaturate.  There is a huge improvement.  Of note he was supposed have pulmonary function test today but this was not performed.  He does  not know why.  Apologized for any missteps.    For his pulmonary embolism: It appears that he might not be taking his testosterone shots anymore.  Regarding seasonal allergies: He has not had work-up for this.  But currently it is active.   12/30/2021 Patient presents today for a 3 month follow-up with PFTs. Patient needs simple walk test, symptom scale and FENO today.  Patient is maintained on Stiolto. Has not required albuterol. He has chronic shortness of breath and fatigue symptoms which are significantly better since last visit. No cough. He remains on '10mg'$  prednisone daily. No oxygen reqiuirments during daytime. Using 1L oxygen at bedtime.  HRCT on 12/28/2021 showed regression of subpleural and basilar predominant fibrotic interstitial lung disease compared to May 2022.  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 PFTs showed improvement in FEV1 but decreased diffusion capacity. Eosinophil absolute was 100 and March.  Respiratory allergy panel was negative. FENO today was normal. Discussed starting antifibrtoic to slow progression of pulmonary fibrosis.   02/10/2022 Patient presents today for 1 month follow-up/ new antifibrotic start.  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 He started Esbriet 1.5 weeks ago d/t gout flare. He is tolerating medication well, currently on 2 tablets three times a day. No GI symptoms. Dyspnea appears some better overall. No acute respiratory symptoms. No active cough, mucinex-dm helps when needed. He took 3/4 mild walk the other day without issues. Continues on Stiolto, prn albuterol and '5mg'$  prednisone daily. Overnight oximetry test on 01/24/22 showed that patient spent 5 min with SPO2 less than 88%. SPO2 low 85% and baseline 93%. Patient started on 1L supplemental oxygen at bedtime.   03/24/2022- Interim hx  Patient presents today 2 month follow-up/ ILD. 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 Patient had spirometry with DLCO testing today. He was unable to tolerate antifibrotic medication Esbriet d/t nausea, headache and muscle aches. He was never able to get to full dose. He had labs checked with PCP yesterday, LFTs were normal. Sodium was low and needs recheck in 1 week   He reports increased sob symptoms last couple of weeks. He has associated wheezing and morning sputum production. He takes mucinex first thing in the morning which helps. He in on '5mg'$  prednisone daily. Spirometry showed decline in FEV1 and diffusion capacity compared to June.   Pulmonary testing 03/24/2022 Spirometry>> FVC 2.88 (69%), FEV1 2.23 (75%), ratio 78, DLCOcor 14.05 (56%)     hx OV 05/19/2022  Subjective:  [name], male , DOB: 01-29-1946 , age 12 y.o. 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 And PE with "ILD Falare" Dec 2022/Jan 2023 -> diagnostic revision to likely IPF June 2023 after cT with progression on prob UIP pattern and early HC   - chronic testosteron -  He also has paroxysmal A. fib, low testosterone, chronic hyponatremia, chronic opioid intake.,  Chronic Lyrica  05/19/2022 -   Chief Complaint  Patient presents with   Follow-up    Pt states that he still has problems with SOB which  is about the same since last visit.     HPI TAMARIO HEAL 76 y.o. -returns for follow-up.  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  It is now looking more more that he has IPF but if not IPF progressive phenotype.  The UIP pattern is a bad prognostic sign.  He is feeling stable but his pulmonary function test today shows decline.  We tried him on pirfenidone but he did not tolerated.  He stated given body aches.  Now for the last 1 week he is on full dose nintedanib.  He is having some mild fatigue but no diarrhea.  Overall he feels he is tolerating the medicine well.  Last liver function test end of September 2023 before he went on the Internet.  He will have liver function test today.  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  He is also still on testosterone but he is on anticoagulation.  I did warn him about the risks of pulmonary embolism with testosterone he is already had pulm embolism.  He has had his RSV and flu vaccine and he plans to have his COVID mRNA booster.    SYMPTOM SCALE - ILD 08/12/2021 09/28/2021  12/30/2021  02/10/2022  03/24/2022  05/19/2022   Current weight   177lb  182 lbs 187# ofev x 1 week  O2 use 2L [place] but at rest he was 95%  RA RA  RA  ra  Shortness of Breath 0 -> 5 scale with 5 being worst (score 6 If unable to do)        At rest 1 1 0 0 0 1  Simple tasks - showers, clothes change, eating, shaving 3 2 0 0 0 1  Household (dishes, doing bed, laundry) '3 2 1 '$ 0 3 1  Shopping xno 3 1 0 3 2  Walking level at own pace 2 1.5 0 0 0 2  Walking up Stairs 3 2.'5 2 1 4 3  '$ Total (30-36) Dyspnea Score '12 12 4 1 10 10  '$ How bad is your cough? Not often 3.5 Not often 0 1 0  How bad is your fatigue A lot 2 2 2.'5 1 2  '$ How bad is nausea 0 0 0 0 0 0  How bad is vomiting?  0 0 0 0 0 0  How bad is diarrhea? 0 0 0 0 0 0  How bad is anxiety? some 1 0 0 0 3  How bad is depression A little 0 0.5 0 0 3  Any chronic pain - if so where and how bad x  Back  Back  Back  Back and neuopathy   Simple office walk 185 feet x  3 laps goal with forehead probe 08/12/2021  09/28/2021  12/30/2021   O2 used ra ra RA  Number laps completed 1  3  Comments about pace steady  Steady  Resting Pulse Ox/HR 95% and 89/min 99% and HR 90 97%; HR 97   Final Pulse Ox/HR 86% and 122/min 94% and HR 114 98%; HR 120  Desaturated </= 88% no no No  Desaturated <= 3% points yes Yes, 5 ponts No  Got Tachycardic >/= 90/min yes yes Yes  Symptoms at end of test dyspnea Mild does None  Miscellaneous comments x improve x    PFT  Latest Ref Rng & Units 03/24/2022    9:14 AM 12/30/2021   10:56 AM 12/08/2020   12:01 PM  PFT Results  FVC-Pre L 2.88  3.27  2.97   FVC-Predicted Pre % 69  79  71   FVC-Post L   2.96   FVC-Predicted Post %   71   Pre FEV1/FVC % % 78  76  66   Post FEV1/FCV % %   70   FEV1-Pre L 2.23  2.49  1.97   FEV1-Predicted Pre % 75  84  65   FEV1-Post L   2.07   DLCO uncorrected ml/min/mmHg 12.95  15.89  18.01   DLCO UNC% % 52  64  72   DLCO corrected ml/min/mmHg 14.05  17.38  20.52   DLCO COR %Predicted % 56  70  82   DLVA Predicted % 84  91  120   TLC L   5.04   TLC % Predicted %   71   RV % Predicted %   95    HRCT June 2023  arrative & Impression  CLINICAL DATA:  Interstitial lung disease   EXAM: CT CHEST WITHOUT  CONTRAST   TECHNIQUE: Multidetector CT imaging of the chest was performed following the standard protocol without intravenous contrast. High resolution imaging of the lungs, as well as inspiratory and expiratory imaging, was performed.   RADIATION DOSE REDUCTION: This exam was performed according to the departmental dose-optimization program which includes automated exposure control, adjustment of the mA and/or kV according to patient size and/or use of iterative reconstruction technique.   COMPARISON:  Chest CT dated September 01, 2021; chest CT dated August 07, 2021   FINDINGS: Cardiovascular: Normal heart size. No pericardial effusion. Moderate three-vessel coronary artery calcifications. Mild atherosclerotic disease of the thoracic aorta.   Mediastinum/Nodes: Esophagus and thyroid are unremarkable. No pathologically enlarged lymph nodes seen in the chest.   Lungs/Pleura: Central airways are patent. No significant air trapping. Mild centrilobular emphysema. Mild ground-glass opacities of the left greater than right lung apices, similar to most recent prior exam, but improved when compared more remote priors. Lower lung predominant subpleural reticular opacities with associated traction bronchiectasis and possible early honeycomb change of the left lower lobe, findings have progressed when compared with Dec 11, 2020 prior. Small bilateral solid pulmonary nodules are unchanged when compared with Dec 11, 2020 prior and favored to be benign. Reference nodule of the right middle lobe measuring 4 mm on series 4, image 157, unchanged. Reference nodule of the right lower lobe measuring 4 mm on image 161, unchanged.   Upper Abdomen: No acute abnormality.   Musculoskeletal: Orthopedic hardware of the thoracolumbar spine. No acute osseous abnormality.   IMPRESSION: 1. 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 Findings are categorized as probable UIP per consensus guidelines: Diagnosis of Idiopathic Pulmonary Fibrosis: An Official ATS/ERS/JRS/ALAT Clinical Practice Guideline. [place], Iss 5, [id number], Mar 18 2017. 2. Mild ground-glass opacities of the left greater than right lung apices, similar to most recent prior exam, but improved when compared more remote priors, likely sequela of prior infectious or inflammatory process. 3. Aortic Atherosclerosis (ICD10-I70.0) and Emphysema (ICD10-J43.9).     Electronically Signed   By: Yetta Glassman M.D.   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   reports that he quit smoking about 32 years ago. His smoking use included cigarettes. He has a 20.00 pack-year smoking history. He has never used smokeless tobacco.  Past Surgical History:  Procedure Laterality Date   ATRIAL FIBRILLATION ABLATION N/A 01/27/2022   Procedure: ATRIAL FIBRILLATION ABLATION;  Surgeon: Constance Haw, MD;  Location: [facility];  Service: Cardiovascular;  Laterality: N/A;   BACK SURGERY  2012 and 1994   Dr Margret Chance, screws and cage in low back   BIOPSY  01/09/2020   Procedure: BIOPSY;  Surgeon: Otis Brace,  MD;  Location: [facility];  Service: Gastroenterology;;   BRONCHIAL BIOPSY  11/17/2020   Procedure: BRONCHIAL BIOPSIES;  Surgeon: Laurin Coder, MD;  Location: [facility];  Service: Endoscopy;;   BRONCHIAL BRUSHINGS  11/17/2020   Procedure: BRONCHIAL BRUSHINGS;  Surgeon: Laurin Coder, MD;  [facility];  Service: Endoscopy;;   BRONCHIAL WASHINGS  11/17/2020   Procedure: BRONCHIAL WASHINGS;  Surgeon: Laurin Coder, MD;  [facility];  Service: Endoscopy;;   COLONOSCOPY WITH PROPOFOL N/A 01/09/2020   Procedure: COLONOSCOPY WITH PROPOFOL;  Surgeon: Otis Brace, MD;  [facility];  Service: Gastroenterology;  Laterality: N/A;   ESOPHAGOGASTRODUODENOSCOPY (EGD) WITH PROPOFOL N/A 01/09/2020   Procedure: ESOPHAGOGASTRODUODENOSCOPY (EGD) WITH PROPOFOL;  Surgeon: Otis Brace, MD;  [facility];  Service: Gastroenterology;  Laterality: N/A;   EYE SURGERY Bilateral    2016   HARDWARE REVISION  03/12/2019   REVISION OF HARDWARE THORACIC TEN-THORACIC ELEVEN WITH APPLICATION OF ADDITIONAL RODS AND ROD SLEEVES THORACIC EIGHT-THORACIC TWELVE 12-LUMBAR ONE (N/A )   HEMOSTASIS CONTROL  11/17/2020   Procedure: HEMOSTASIS CONTROL;  Surgeon: Laurin Coder, MD;  [facility];  Service: Endoscopy;;   IR US GUIDE BX ASP/DRAIN  11/13/2020   LAMINECTOMY  02/12/2019   DECOMPRESSIVE LAMINECTOMY THORACIC NINE-THORACIC TEN AND THORACIC TEN-THORACIC ELEVEN, EXTENSION OF THORACOLUMBAR FUSION FROM THORACIC TEN TO THORACIC FIVE, LOCAL BONE GRAFT, ALLOGRAFT AND VIVIGEN (N/A)   POLYPECTOMY  01/09/2020   Procedure: POLYPECTOMY;  Surgeon: Otis Brace, MD;  [facility];  Service: Gastroenterology;;   REMOVAL OF RODS AND PEDICLE SCREWS T5 AND T6, EXPLORATION OF FUSION, BIOPSY TRANSPEDICULAR T5 AND T6 (N/A )  09/14/2020   SPINAL FUSION N/A 02/12/2019   Procedure: DECOMPRESSIVE LAMINECTOMY THORACIC NINE-THORACIC TEN AND THORACIC TEN-THORACIC  ELEVEN, EXTENSION OF THORACOLUMBAR FUSION FROM THORACIC TEN TO THORACIC FIVE, LOCAL BONE GRAFT, ALLOGRAFT AND VIVIGEN;  Surgeon: Jessy Oto, MD;  Location: [facility];  Service: Orthopedics;  Laterality: N/A;  DECOMPRESSIVE LAMINECTOMY THORACIC NINE-THORACIC TEN AND THORACIC TEN-THORACIC ELEVEN, EXTENSION OF TH   SPINAL FUSION N/A 03/12/2019   Procedure: REVISION OF HARDWARE THORACIC TEN-THORACIC ELEVEN WITH APPLICATION OF ADDITIONAL RODS AND ROD SLEEVES THORACIC EIGHT-THORACIC TWELVE 12-LUMBAR ONE;  Surgeon: Jessy Oto, MD;  Location: [facility];  Service: Orthopedics;  Laterality: N/A;   TONSILLECTOMY     as child   torn rotator cuff  2010   right   VIDEO BRONCHOSCOPY N/A 11/17/2020   Procedure: VIDEO BRONCHOSCOPY WITH FLUORO;  Surgeon: Laurin Coder, MD;  [facility];  Service: Endoscopy;  Laterality: N/A;    Allergies  Allergen Reactions   Daptomycin     Drug induced pneumonia    Diltiazem Other (See Comments)    Passed out    Immunization History  Administered Date(s) Administered   Fluad Quad(high Dose 65+) 04/05/2019, 06/29/2020, 05/18/2021, 04/22/2022   Influenza Split 04/23/2012   Influenza Whole 02/16/2011   Influenza, High Dose Seasonal PF 05/06/2013, 03/25/2016, 05/05/2017, 06/07/2018   PFIZER(Purple Top)SARS-COV-2 Vaccination 08/23/2019, 09/13/2019, 06/07/2020   Pfizer Covid-19 Vaccine Bivalent Booster 64yr & up 04/29/2021   Pneumococcal Conjugate-13 03/25/2016   Pneumococcal Polysaccharide-23 01/20/2009, 10/19/2011   Respiratory Syncytial Virus Vaccine,Recomb Aduvanted(Arexvy) 04/09/2022   Td 07/09/2003, 07/18/2008   Tdap 07/20/2011   Zoster, Live 04/27/2016    Family History  Problem Relation Age of Onset   Hypertension Mother    Diabetes Mother        type 2   Cancer Mother 55      breast in remission   Emphysema Father        smoker   COPD Father        smoker   Stroke Father 742      mini   Heart disease Father    Hypertension Sister     Hyperlipidemia Sister    Scoliosis Sister    Osteoporosis Sister    Hypertension Maternal Grandmother    Scoliosis Maternal Grandmother    Heart disease Maternal Grandfather    Heart disease Paternal Grandfather        smoker   Heart disease Daughter      Current Outpatient Medications:    albuterol (PROVENTIL HFA) 108 (90 Base) MCG/ACT inhaler, TAKE 2 PUFFS BY MOUTH EVERY 6 HOURS AS NEEDED FOR WHEEZE OR SHORTNESS OF BREATH, Disp: 8 g, Rfl: 5   atorvastatin (LIPITOR) 10 MG tablet, TAKE 1 TABLET BY MOUTH EVERY DAY, Disp: 90 tablet, Rfl: 1   busPIRone (BUSPAR) 15 MG tablet, Take 1 tablet (15 mg total) by mouth 2 (two) times daily., Disp: 60 tablet, Rfl: 0   dextromethorphan-guaiFENesin (MUCINEX DM) 30-600 MG 12hr tablet, Take 1 tablet by mouth 2 (two) times daily., Disp: , Rfl:    famciclovir (FAMVIR) 500 MG tablet, TAKE 1 TABLET BY MOUTH THREE TIMES A DAY, Disp: 21 tablet, Rfl: 0   fluticasone (FLONASE) 50 MCG/ACT nasal spray, SPRAY 2 SPRAYS INTO EACH NOSTRIL EVERY DAY, Disp: 48 mL, Rfl: 1   Fluticasone-Umeclidin-Vilant (TRELEGY ELLIPTA) 200-62.5-25 MCG/ACT AEPB, Inhale 1 puff into the lungs daily., Disp: 60 each, Rfl: 5   glucose blood (CONTOUR NEXT TEST) test strip, Check blood sugar once daily, Disp: 100 each, Rfl: 12   insulin glargine (LANTUS) 100 UNIT/ML Solostar Pen, Inject 10 Units into the skin daily., Disp: 15 mL, Rfl: 0   Insulin Pen Needle (B-D UF III MINI PEN NEEDLES) 31G X 5 MM MISC, USE WITH LANTUS, Disp: 100 each, Rfl: 0   metFORMIN (GLUCOPHAGE) 1000 MG tablet, TAKE 1 TABLET (1,000 MG TOTAL) BY MOUTH TWICE A DAY WITH FOOD, Disp: 180 tablet, Rfl: 0   Nintedanib (OFEV) 150 MG CAPS, Take 150 mg by mouth 2 (two) times daily., Disp: , Rfl:    oxyCODONE-acetaminophen (PERCOCET/ROXICET) 5-325 MG tablet, Take 1 tablet by mouth every 4 (four) hours as needed for moderate pain., Disp: , Rfl:    pantoprazole (PROTONIX) 40 MG tablet, TAKE 1 TABLET BY MOUTH TWICE A DAY BEFORE A MEAL,  Disp: 180 tablet, Rfl: 1   predniSONE (DELTASONE) 10 MG tablet, TAKE 1 TABLET (10 MG TOTAL) BY MOUTH DAILY WITH BREAKFAST. (Patient taking differently: Take 10 mg by mouth daily with breakfast. Taking 1/2 tablet daily), Disp: 30 tablet, Rfl: 3   pregabalin (LYRICA) 75 MG capsule, TAKE 1 CAPSULE BY MOUTH TWICE A DAY (Patient taking differently: Take 75 mg by mouth 2 (two) times daily.), Disp: 60 capsule, Rfl: 3   sitaGLIPtin (JANUVIA) 50 MG tablet, Take 1 tablet (50 mg total) by mouth daily., Disp: 30 tablet, Rfl: 3   STIOLTO RESPIMAT 2.5-2.5 MCG/ACT AERS, Inhale 2 puffs into the lungs daily., Disp: 1 each, Rfl: 5   tamsulosin (FLOMAX) 0.4 MG CAPS capsule, Take 2 capsules (0.8 mg total) by mouth daily. (Patient taking differently: Take 0.8 mg by mouth at bedtime.), Disp: 180 capsule, Rfl: 3   tiZANidine (ZANAFLEX) 4 MG tablet, TAKE 1 TABLET BY MOUTH EVERY 6 HOURS AS NEEDED, Disp: 360 tablet, Rfl: 1   venlafaxine XR (EFFEXOR-XR) 150 MG 24 hr capsule, Take 1 capsule (150 mg total) by mouth daily with breakfast., Disp: 90 capsule, Rfl: 0   warfarin (COUMADIN) 5 MG tablet, TAKE 1 TO 2 TABLETS DAILY OR AS PRESCRIBED BY COUMADIN CLINIC, Disp: 130 tablet, Rfl: 1   zolpidem (AMBIEN) 10 MG tablet, Take 0.5-1 tablets (5-10 mg total) by mouth at bedtime as needed for sleep., Disp: 15 tablet, Rfl: 1   colchicine 0.6 MG tablet, Take 1 tablet (0.6 mg total) by mouth 2 (two) times daily for 14 days., Disp: 28 tablet, Rfl: 0  Current Facility-Administered Medications:    testosterone cypionate (DEPOTESTOSTERONE CYPIONATE) injection 200 mg, 200 mg, Intramuscular, Q14 Days, Saguier, Edward, PA-C, 200 mg at 07/06/21 0953      Objective:   Vitals:   05/19/22 1259  BP: 132/70  Pulse: 62  Temp: 98.1 F (36.7 C)  TempSrc: Oral  SpO2: 99%  Weight: 187 lb 9.6 oz (85.1 kg)  Height: '5\' 10"'$  (1.778 m)    Estimated body mass index is 26.92 kg/m as calculated from the following:   Height as of this encounter: 5'  10" (1.778 m).   Weight as of this encounter: 187 lb 9.6 oz (85.1 kg).  '@WEIGHTCHANGE'$ @  [employer]   05/19/22 1259  Weight: 187 lb 9.6 oz (85.1 kg)     Physical Exam   General: No distress. Looks well Neuro: Alert and Oriented x 3. GCS 15. Speech normal Psych: Pleasant Resp:  Barrel Chest - no.  Wheeze - no, Crackles - mild at base, No overt respiratory distress CVS: Normal heart sounds. Murmurs - no Ext: Stigmata of Connective Tissue Disease - no HEENT: Normal upper airway. PEERL +. No post nasal drip        Assessment:       ICD-10-CM   1. IPF (idiopathic pulmonary fibrosis) (HCC)  J84.112 Hepatic function panel    2. UIP (usual interstitial pneumonitis) ([place])  J84.112     3. Medication monitoring encounter  Z51.81 Hepatic function panel    4. Pulmonary emphysema, unspecified emphysema type ([place])  J43.9          Plan:     Patient Instructions  Acute on chronic respiratory failure with hypoxia [facility]) - Spring 2022 & Dec 2022/Jan 2023 Interstitial pulmonary disease ([place]) - UIP progressive History of smoking 20 ppd - possible emphsyema on CT  [place] progressive pumonary fibrosis even on PFT Sept 2023 (since may 20233 and even since June 2023)  Esbriet intolerant Now on ofev x 1 week with some mild related fatigue   Plan - cotontinue stiolto as before - Continue oxygen with at night and exertion > 600 feet (200 yards) - Continue prednisone 10 mg/day  or '5mg'$  per day - continue at the dose you are currently on  - not fully sure if you are on 10 or '5mg'$ /day  -I am still hesitant to stop prednisone at this point at least for a few months till we get clarity on your lung disease and course - cotninue ofev (STarted last week)   -= we will monitor fatigue and other side effects - Check LFT 05/19/2022  (last checked 04/12/22) - return in 4-6 weeks to see APP Luis Brown) to asess uptake with ofev - Will discus in MDD case conference regarding your fibrosis - Do  spirometry and dlco in 12 weeks    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 Chase Caller in 12 weeks for 30-minute visit  - [simple walking desaturation test and symptom score at follow-up] =   High complex medical condition requiring high risk prescription with intensive therapeutic monitoring requirement   SIGNATURE    Dr. Brand Males, M.D., F.C.C.P,  Pulmonary and Critical Care Medicine Staff Physician, [facility] Director - Interstitial Lung Disease  Program  P[facility] [place] [place], [place], [address]  Pager: [phone], If no answer or between  15:00h - 7:00h: call [phone] Telephone: [phone]  1:26 PM 05/19/2022

## 2022-05-19 NOTE — Patient Instructions (Addendum)
Acute on chronic respiratory failure with hypoxia [facility]) - Spring 2022 & Dec 2022/Jan 2023 Interstitial pulmonary disease ([place]) - UIP progressive History of smoking 20 ppd - possible emphsyema on CT  [place] progressive pumonary fibrosis even on PFT Sept 2023 (since may 20233 and even since June 2023) Esbriet intolerant Now on ofev x 1 week with some mild related fatigue   Plan - cotontinue stiolto as before - Continue oxygen with at night and exertion > 600 feet (200 yards) - Continue prednisone 10 mg/day  or '5mg'$  per day - continue at the dose you are currently on  - not fully sure if you are on 10 or '5mg'$ /day  -I am still hesitant to stop prednisone at this point at least for a few months till we get clarity on your lung disease and course - cotninue ofev (STarted last week)   -= we will monitor fatigue and other side effects - Check LFT 05/19/2022  (last checked 04/12/22) - return in 4-6 weeks to see APP Derl Barrow) to asess uptake with ofev - Will discus in MDD case conference regarding your fibrosis - Do spirometry and dlco in 12 weeks    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 Chase Caller in 12 weeks for 30-minute visit  - [simple walking desaturation test and symptom score at follow-up] =

## 2022-05-20 ENCOUNTER — Telehealth: Payer: Self-pay

## 2022-05-20 NOTE — Telephone Encounter (Signed)
Patient dropped off income documents. Faxed to [employer]. Patient will likely be denied based on income listed on tax return  Fax: [phone] Phone: [phone]  Knox Saliva, PharmD, MPH, BCPS, CPP Clinical Pharmacist (Rheumatology and Pulmonology)

## 2022-05-20 NOTE — Telephone Encounter (Signed)
Received request for orders from [facility] regarding patient's antibiotics. Sent orders to stop dalbavancin per Dr. Alcario Drought note on 10/19 at patient's request.   Beryle Flock, RN

## 2022-05-23 ENCOUNTER — Other Ambulatory Visit (HOSPITAL_BASED_OUTPATIENT_CLINIC_OR_DEPARTMENT_OTHER): Payer: Self-pay

## 2022-05-23 MED ORDER — COMIRNATY 30 MCG/0.3ML IM SUSY
PREFILLED_SYRINGE | INTRAMUSCULAR | 0 refills | Status: DC
  Filled 2022-05-23: qty 0.3, 1d supply, fill #0

## 2022-05-24 ENCOUNTER — Encounter: Payer: Self-pay | Admitting: Medical

## 2022-05-24 DIAGNOSIS — R0602 Shortness of breath: Secondary | ICD-10-CM | POA: Diagnosis not present

## 2022-05-24 DIAGNOSIS — J9601 Acute respiratory failure with hypoxia: Secondary | ICD-10-CM | POA: Diagnosis not present

## 2022-05-25 MED ORDER — ZOLPIDEM TARTRATE 10 MG PO TABS: 5.0000 mg | ORAL_TABLET | Freq: Every evening | ORAL | 1 refills | Status: DC | PRN

## 2022-05-30 ENCOUNTER — Telehealth: Payer: Self-pay | Admitting: Family Medicine

## 2022-05-30 NOTE — Telephone Encounter (Signed)
Copied from [place] [id number]. Topic: Medicare AWV >> May 30, 2022  2:16 PM Gillis Santa wrote: Reason for CRM: [id number] FOR PATIENT TO CALL [phone] TO SCHEDULE AWVS Luis Brown

## 2022-05-31 ENCOUNTER — Other Ambulatory Visit (HOSPITAL_COMMUNITY): Payer: Self-pay

## 2022-05-31 ENCOUNTER — Encounter: Payer: Self-pay | Admitting: Internal Medicine

## 2022-05-31 DIAGNOSIS — M961 Postlaminectomy syndrome, not elsewhere classified: Secondary | ICD-10-CM | POA: Diagnosis not present

## 2022-05-31 DIAGNOSIS — M4624 Osteomyelitis of vertebra, thoracic region: Secondary | ICD-10-CM | POA: Diagnosis not present

## 2022-05-31 DIAGNOSIS — Z79891 Long term (current) use of opiate analgesic: Secondary | ICD-10-CM | POA: Diagnosis not present

## 2022-05-31 DIAGNOSIS — J84112 Idiopathic pulmonary fibrosis: Secondary | ICD-10-CM

## 2022-05-31 DIAGNOSIS — M150 Primary generalized (osteo)arthritis: Secondary | ICD-10-CM | POA: Diagnosis not present

## 2022-05-31 DIAGNOSIS — G894 Chronic pain syndrome: Secondary | ICD-10-CM | POA: Diagnosis not present

## 2022-05-31 MED ORDER — OFEV 150 MG PO CAPS: 150.0000 mg | ORAL_CAPSULE | Freq: Two times a day (BID) | ORAL | 5 refills | Status: DC

## 2022-05-31 NOTE — Telephone Encounter (Signed)
Spoke with patient regarding Ofev copay. Copay is now ~$670. Rx sent to [facility]. Called patient and provided with phone number for pharmacy.  Advised him to call tomorrow to schedule shipment to his home.  Knox Saliva, PharmD, MPH, BCPS, CPP Clinical Pharmacist (Rheumatology and Pulmonology)

## 2022-06-01 ENCOUNTER — Telehealth (INDEPENDENT_AMBULATORY_CARE_PROVIDER_SITE_OTHER): Payer: Medicare Other | Admitting: Medical

## 2022-06-01 ENCOUNTER — Ambulatory Visit: Payer: Medicare Other | Attending: Cardiology

## 2022-06-01 DIAGNOSIS — E0865 Diabetes mellitus due to underlying condition with hyperglycemia: Secondary | ICD-10-CM

## 2022-06-01 DIAGNOSIS — I4891 Unspecified atrial fibrillation: Secondary | ICD-10-CM

## 2022-06-01 DIAGNOSIS — Z7901 Long term (current) use of anticoagulants: Secondary | ICD-10-CM

## 2022-06-01 DIAGNOSIS — F419 Anxiety disorder, unspecified: Secondary | ICD-10-CM

## 2022-06-01 DIAGNOSIS — I480 Paroxysmal atrial fibrillation: Secondary | ICD-10-CM | POA: Diagnosis not present

## 2022-06-01 DIAGNOSIS — Z794 Long term (current) use of insulin: Secondary | ICD-10-CM

## 2022-06-01 LAB — POCT INR: INR: 3.2 — AB (ref 2.0–3.0)

## 2022-06-01 MED ORDER — ALBUTEROL SULFATE HFA 108 (90 BASE) MCG/ACT IN AERS: INHALATION_SPRAY | RESPIRATORY_TRACT | 5 refills | Status: DC

## 2022-06-01 MED ORDER — BUSPIRONE HCL 15 MG PO TABS: 15.0000 mg | ORAL_TABLET | Freq: Two times a day (BID) | ORAL | 11 refills | Status: DC

## 2022-06-01 NOTE — Progress Notes (Signed)
   Subjective:    [name], male    DOB: 03/25/1946, 76 y.o.   MRN: [id number]  HPI  Virtual Visit via Video Note  I connected with Luis Brown on 06/01/22 at  2:40 PM EST by a video enabled telemedicine application and verified that I am speaking with the correct person using two identifiers.  Location: Patient: home Provider: office   I discussed the limitations of evaluation and management by telemedicine and the availability of in person appointments. The patient expressed understanding and agreed to proceed.  History of Present Illness:  Pt states that buspar is helping a lot. He states only taking one tab at times will use twice a day. He is finding that he is less anxious and insomnia.   He is fairly active recently. Cutting grass and active. He walks his yard in driveway.   Pt is still on effoxor 150 mg daily.   Pt last A1c was 9.0. on meds rx'd by pcp. Pt states he is eating low sugar diet.   Observations/Objective:  General-no acute distress, pleasant, oriented. Lungs- on inspection lungs appear unlabored. Neck- no tracheal deviation or jvd on inspection. Neuro- gross motor function appears intact.    Assessment and Plan:  Patient Instructions  Anxiety much improved recently with BuSpar and describes that it seems to be helping him sleep as well.  Continue current regimen of 1 tabs daily or twice daily on days of more anxious.  Also continue with Effexor 150 mg.  Went ahead and refill patient's BuSpar.  Diabetes with last A1c in the summer 9.0.  Continue current diabetic med regimen and placed future metabolic panel and N0I to be done week of December 4.  Advised to call to get scheduled for that lab.  Follow-up date to be determined after early December lab review.   Mackie Pai, PA-C  Follow Up Instructions:    I discussed the assessment and treatment plan with the patient. The patient was provided an opportunity to ask  questions and all were answered. The patient agreed with the plan and demonstrated an understanding of the instructions.   The patient was advised to call back or seek an in-person evaluation if the symptoms worsen or if the condition fails to improve as anticipated.     Mackie Pai, PA-C   Review of Systems  Constitutional:  Negative for chills, fatigue and fever.  Respiratory:  Negative for cough, chest tightness, shortness of breath and wheezing.   Cardiovascular:  Negative for chest pain and palpitations.  Neurological:  Positive for dizziness.  Psychiatric/Behavioral:  Positive for sleep disturbance. The patient is nervous/anxious.        Much better controlled with buspar.       Objective:   Physical Exam        Assessment & Plan:

## 2022-06-01 NOTE — Patient Instructions (Signed)
Anxiety much improved recently with BuSpar and describes that it seems to be helping him sleep as well.  Continue current regimen of 1 tabs daily or twice daily on days of more anxious.  Also continue with Effexor 150 mg.  Went ahead and refill patient's BuSpar.  Diabetes with last A1c in the summer 9.0.  Continue current diabetic med regimen and placed future metabolic panel and S5K to be done week of December 4.  Advised to call to get scheduled for that lab.  Follow-up date to be determined after early December lab review.

## 2022-06-01 NOTE — Patient Instructions (Signed)
Description   Eat greens today and then continue taking Warfarin 1.5 tablets daily except for 1 tablet on Wednesday.  Recheck INR in 5 weeks.  Coumadin [facility]# [phone]

## 2022-06-01 NOTE — Telephone Encounter (Signed)
Rx for Ofev sent to [facility] yesterday. Patient states he will plan to re-apply next year when he has updated income since his wife has retired  Knox Saliva, PharmD, MPH, BCPS, CPP Clinical Pharmacist (Rheumatology and Pulmonology)

## 2022-06-05 ENCOUNTER — Other Ambulatory Visit: Payer: Self-pay | Admitting: Medical

## 2022-06-06 ENCOUNTER — Encounter: Payer: Self-pay | Admitting: Medical

## 2022-06-08 ENCOUNTER — Telehealth: Payer: Self-pay | Admitting: Internal Medicine

## 2022-06-08 NOTE — Telephone Encounter (Signed)
Called patient and he is having some questions about the patient assistance program and pharmacy for the Ofev. He has some questions that I don't have answers for and I told patient I would message our pharmacy team to answer his questions concerning the ofev.   Please advise

## 2022-06-08 NOTE — Telephone Encounter (Signed)
Attempted to return call to patient. Unable to reach. Left VM requesting return call  Knox Saliva, PharmD, MPH, BCPS, CPP Clinical Pharmacist (Rheumatology and Pulmonology)

## 2022-06-10 ENCOUNTER — Other Ambulatory Visit: Payer: Self-pay | Admitting: Medical

## 2022-06-10 ENCOUNTER — Encounter: Payer: Self-pay | Admitting: Medical

## 2022-06-10 NOTE — Telephone Encounter (Signed)
Mychart message sent by pt: Luis Costa "Ike"  P [facility] (supporting Brand Males, MD)3 minutes ago (11:54 AM)    My shortness of breath has gotten worse. I have an appointment with [name] on Dec. 4. Is there any way I could have another breathing test done soon? Thanks, Luis Brown, please advise.

## 2022-06-13 NOTE — Addendum Note (Signed)
Addended by: Lorretta Harp on: 06/13/2022 10:08 AM   Modules accepted: Orders

## 2022-06-13 NOTE — Telephone Encounter (Signed)
Patient does have an upcoming appt with MR January 2024.

## 2022-06-13 NOTE — Telephone Encounter (Signed)
We can do a spirometry test in office, needs repeat HRCT re: ILD/sob. Does he have any apt coming up with Dr, Guadalupe Dawn.

## 2022-06-14 ENCOUNTER — Encounter: Payer: Self-pay | Admitting: Internal Medicine

## 2022-06-14 NOTE — Telephone Encounter (Signed)
Sounds good, thanks.

## 2022-06-14 NOTE — Telephone Encounter (Signed)
Will forward to front staff to schedule spiro and DLCO at time of MR OV follow up on 1/16 as advised at last OV on 11/02. PFT would need to be 30 min. Will forward AVS plan below.     Plan - cotontinue stiolto as before - Continue oxygen with at night and exertion > 600 feet (200 yards) - Continue prednisone 10 mg/day  or '5mg'$  per day - continue at the dose you are currently on            - not fully sure if you are on 10 or '5mg'$ /day            -I am still hesitant to stop prednisone at this point at least for a few months till we get clarity on your lung disease and course - cotninue ofev (STarted last week)             -= we will monitor fatigue and other side effects - Check LFT 05/19/2022  (last checked 04/12/22) - return in 4-6 weeks to see APP Derl Barrow) to asess uptake with ofev - Will discus in MDD case conference regarding your fibrosis - Do spirometry and dlco in 12 weeks      Follow-up Derl Barrow in 4-6 weeks - Do spirometry and DLCO in 12 weeks -Return to see Dr. Chase Caller in 12 weeks for 30-minute visit  - [simple walking desaturation test and symptom score at follow-up] =

## 2022-06-14 NOTE — Telephone Encounter (Signed)
Luis Brown/MR, will forward as FYI. Pt was wanting to know if he could push OV with Luis Brown till after CT on 12/27. I advised to keep OV with Luis Brown on 12/4, CT on 12/27 and OV with MR on 08/02/22.

## 2022-06-15 ENCOUNTER — Other Ambulatory Visit: Payer: Self-pay

## 2022-06-15 ENCOUNTER — Ambulatory Visit: Payer: Medicare Other | Admitting: Medical

## 2022-06-15 DIAGNOSIS — M4624 Osteomyelitis of vertebra, thoracic region: Secondary | ICD-10-CM

## 2022-06-15 NOTE — Telephone Encounter (Signed)
ATC patient concerning his questions with Ofev. Left VM requesting return call

## 2022-06-16 ENCOUNTER — Other Ambulatory Visit: Payer: Self-pay

## 2022-06-16 ENCOUNTER — Other Ambulatory Visit: Payer: Medicare Other

## 2022-06-16 DIAGNOSIS — M4624 Osteomyelitis of vertebra, thoracic region: Secondary | ICD-10-CM

## 2022-06-16 LAB — CBC WITH DIFFERENTIAL/PLATELET
Absolute Monocytes: 842 cells/uL (ref 200–950)
Basophils Absolute: 31 cells/uL (ref 0–200)
Basophils Relative: 0.5 %
Eosinophils Absolute: 128 cells/uL (ref 15–500)
Eosinophils Relative: 2.1 %
HCT: 36.8 % — ABNORMAL LOW (ref 38.5–50.0)
Hemoglobin: 12.4 g/dL — ABNORMAL LOW (ref 13.2–17.1)
Lymphs Abs: 348 cells/uL — ABNORMAL LOW (ref 850–3900)
MCH: 30.2 pg (ref 27.0–33.0)
MCHC: 33.7 g/dL (ref 32.0–36.0)
MCV: 89.5 fL (ref 80.0–100.0)
MPV: 8.6 fL (ref 7.5–12.5)
Monocytes Relative: 13.8 %
Neutro Abs: 4752 cells/uL (ref 1500–7800)
Neutrophils Relative %: 77.9 %
Platelets: 312 10*3/uL (ref 140–400)
RBC: 4.11 10*6/uL — ABNORMAL LOW (ref 4.20–5.80)
RDW: 14.9 % (ref 11.0–15.0)
Total Lymphocyte: 5.7 %
WBC: 6.1 10*3/uL (ref 3.8–10.8)

## 2022-06-16 NOTE — Telephone Encounter (Signed)
Patient is scheduled for PFT B&A (spiro/dlco) on 1/16 at 12:30p prior to seeing MR. Patient is aware- nothing further needed.

## 2022-06-17 LAB — COMPREHENSIVE METABOLIC PANEL
AG Ratio: 2.2 (calc) (ref 1.0–2.5)
ALT: 24 U/L (ref 9–46)
AST: 30 U/L (ref 10–35)
Albumin: 3.9 g/dL (ref 3.6–5.1)
Alkaline phosphatase (APISO): 91 U/L (ref 35–144)
BUN: 12 mg/dL (ref 7–25)
CO2: 26 mmol/L (ref 20–32)
Calcium: 8.5 mg/dL — ABNORMAL LOW (ref 8.6–10.3)
Chloride: 93 mmol/L — ABNORMAL LOW (ref 98–110)
Creat: 0.75 mg/dL (ref 0.70–1.28)
Globulin: 1.8 g/dL (calc) — ABNORMAL LOW (ref 1.9–3.7)
Glucose, Bld: 283 mg/dL — ABNORMAL HIGH (ref 65–99)
Potassium: 4.4 mmol/L (ref 3.5–5.3)
Sodium: 128 mmol/L — ABNORMAL LOW (ref 135–146)
Total Bilirubin: 0.7 mg/dL (ref 0.2–1.2)
Total Protein: 5.7 g/dL — ABNORMAL LOW (ref 6.1–8.1)

## 2022-06-17 LAB — SEDIMENTATION RATE: Sed Rate: 2 mm/h (ref 0–20)

## 2022-06-17 LAB — C-REACTIVE PROTEIN: CRP: 7.6 mg/L (ref <8.0)

## 2022-06-20 ENCOUNTER — Encounter: Payer: Self-pay | Admitting: Primary Care

## 2022-06-20 ENCOUNTER — Ambulatory Visit: Payer: Medicare Other | Admitting: Primary Care

## 2022-06-20 VITALS — BP 138/62 | HR 95 | Temp 97.6°F | Ht 70.0 in | Wt 188.6 lb

## 2022-06-20 DIAGNOSIS — J84112 Idiopathic pulmonary fibrosis: Secondary | ICD-10-CM | POA: Diagnosis not present

## 2022-06-20 DIAGNOSIS — J9611 Chronic respiratory failure with hypoxia: Secondary | ICD-10-CM | POA: Diagnosis not present

## 2022-06-20 DIAGNOSIS — E871 Hypo-osmolality and hyponatremia: Secondary | ICD-10-CM | POA: Diagnosis not present

## 2022-06-20 DIAGNOSIS — J439 Emphysema, unspecified: Secondary | ICD-10-CM

## 2022-06-20 NOTE — Assessment & Plan Note (Addendum)
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 Started on OFEV in November 2023. He is tolerating medication well without any GI side effects. He has some associated fatigue. ILD symptom scale and liver function are stable. He is on prednisone '5mg'$  daily.  He is scheduled for CT chest on 07/13/2022 and pulmonary function testing in January to reassess lung function. No plans to taper off prednisone until follow-up testing has been completed. He has a follow-up scheduled in January with Dr. Chase Caller.

## 2022-06-20 NOTE — Assessment & Plan Note (Signed)
-   Chronic; Baseline between 129-131. Denies change in mental status.

## 2022-06-20 NOTE — Patient Instructions (Addendum)
Continue OFEV '150mg'$  twice daily Continue Prednisone '5mg'$  daily  Continue Trelegy one puff daily in the morning Continue as needed mucinex for congestion  Continue oxygen at bedtime  Scheduled for CT chest on 07/13/22  Scheduled for PFTs in January to monitor/re-evaluate lung function  Need repeat liver function test end of December (come in to office any day between 9am-4:30- avoid lunch time)  Follow-up Dr. Chase Caller on 08/02/21

## 2022-06-20 NOTE — Progress Notes (Signed)
$'@Patient'F$  ID: Luis Brown, male    DOB: 1945/11/11, 76 y.o.   MRN: [id number]  Chief Complaint  Patient presents with   Follow-up    Some SOB.  Needs refill on Trelegy.    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 fib, low testosterone, chronic hyponatremia, chronic opioid intake.,  Chronic Lyrica  Previous [facility] pulmonary encounter:  05/19/2022 -   Chief Complaint  Patient presents with   Follow-up    Pt states that he still has problems with SOB which is about the same since last visit.    Luis Brown 76 y.o. -returns for follow-up.  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  It is now looking more more that he has IPF but if not IPF progressive phenotype.  The UIP pattern is a bad prognostic sign.  He is feeling stable but his pulmonary function test today shows decline.  We tried him on pirfenidone but he did not tolerated.  He stated given body aches.  Now for the last 1 week he is on full dose nintedanib.  He is having some mild fatigue but no diarrhea.  Overall he feels he is tolerating the medicine well.  Last liver function test end of September 2023 before he went on the Internet.  He will have liver function test today.  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  He is also still on testosterone but he is on anticoagulation.  I did warn him about the risks of pulmonary embolism with testosterone he is already had pulm embolism.  He has had his RSV and flu vaccine and he plans to have his COVID mRNA booster.    06/20/2022- interim hx   Patient presents today for follow-up after starting anti-fibrotic medication.  He feels his breathing  is some worse over the last 4-5 months. When walking at his own pace he does all right.  Increased dyspnea symptoms with quick exertion.  Overall he does okay shopping but takes this time.  Not coughing much, prn mucinex helps loosen congestion. Mainly clears his throat d/t PND. He was started on OFEV in early November, currently taking '150mg'$  twice daily. He is tolerating medication except for fatigue. Fatigue is worse on some days.  Not sleeping well at night requiring naps.  He has had previous sleep study which was negative for OSA. Maintained on Trelegy 244mg and wears oxygen at bedtime. He is taking Prednisone '5mg'$  daily. Liver function within normal limits in November. He is on BuSpar for anxiety which is helping. He takes oxycodone 6 times daily for chronic back pain, following with pain management. He is scheduled for CT chest on 07/13/22 and PFTs in January to re-assess lung function. He has an overview with Dr. RChase Calleron 08/02/21.    SYMPTOM SCALE - ILD 08/12/2021 09/28/2021  12/30/2021  02/10/2022  03/24/2022  05/19/2022  06/20/2022   Current weight   177lb  182 lbs 187# ofev x 1 week Ofev 150 bid  O2 use 2L [place] but at rest he  was 95%  RA RA  RA  ra RA  Shortness of Breath 0 -> 5 scale with 5 being worst (score 6 If unable to do)        At rest 1 1 0 0 0 1 2  Simple tasks - showers, clothes change, eating, shaving 3 2 0 0 0 1 2  Household (dishes, doing bed, laundry) '3 2 1 '$ 0 '3 1 2  '$ Shopping xno 3 1 0 '3 2 2  '$ Walking level at own pace 2 1.5 0 0 0 2 1  Walking up Stairs 3 2.'5 2 1 4 3 2  '$ Total (30-36) Dyspnea Score '12 12 4 1 10 10 11  '$ How bad is your cough? Not often 3.5 Not often 0 1 0 2  How bad is your fatigue A lot 2 2 2.'5 1 2 3  '$ How bad is nausea 0 0 0 0 0 0 0  How bad is vomiting?  0 0 0 0 0 0 0  How bad is diarrhea? 0 0 0 0 0 0 0  How bad is anxiety? some 1 0 0 0 3 3.5 - 4 On  Buspar  How bad is depression A little 0 0.5 0 0 3 1  Any chronic pain - if so where and how bad x  Back  Back  Back  Back and neuopathy Back pain - oxycodone '5mg'$  / following with pain management    Simple office walk 185 feet x  3 laps goal with forehead probe 08/12/2021  09/28/2021  12/30/2021  06/20/2022   O2 used ra ra RA RA  Number laps completed '1  3 3 '$ laps   Comments about pace steady  Steady Steady   Resting Pulse Ox/HR 95% and 89/min 99% and HR 90 97%; HR 97  100%; HR 77  Final Pulse Ox/HR 86% and 122/min 94% and HR 114 98%; HR 120 100%; 76%  Desaturated </= 88% no no No No  Desaturated <= 3% points yes Yes, 5 ponts No No  Got Tachycardic >/= 90/min yes yes Yes No  Symptoms at end of test dyspnea Mild does None None  Miscellaneous comments x improve x x        Allergies  Allergen Reactions   Daptomycin     Drug induced pneumonia    Diltiazem Other (See Comments)    Passed out    Immunization History  Administered Date(s) Administered   COVID-19, mRNA, vaccine(Comirnaty)12 years and older 05/23/2022   Fluad Quad(high Dose 65+) 04/05/2019, 06/29/2020, 05/18/2021, 04/22/2022   Influenza Split 04/23/2012   Influenza Whole 02/16/2011   Influenza, High Dose Seasonal PF 05/06/2013, 03/25/2016, 05/05/2017, 06/07/2018   PFIZER(Purple Top)SARS-COV-2 Vaccination 08/23/2019, 09/13/2019, 06/07/2020   Pfizer Covid-19 Vaccine Bivalent Booster 6yr & up 04/29/2021   Pneumococcal Conjugate-13 03/25/2016   Pneumococcal Polysaccharide-23 01/20/2009, 10/19/2011   Respiratory Syncytial Virus Vaccine,Recomb Aduvanted(Arexvy) 04/09/2022   Td 07/09/2003, 07/18/2008   Tdap 07/20/2011   Zoster, Live 04/27/2016    Past Medical History:  Diagnosis Date   Acute pharyngitis 10/21/2013   Allergy    grass and pollen   Anxiety and depression 10/25/2011   BPH (benign prostatic hyperplasia) 04/23/2012   Chicken pox as a child   DDD (degenerative disc disease)    cervical responds to steroid  injections and low back required surgery   DDD (degenerative disc disease), lumbosacral    Diabetes mellitus    pre   Dyspnea    ED (erectile dysfunction) 04/23/2012  Elevated BP    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   Esophageal reflux 02/10/2015   Fatigue    HTN (hypertension)    Hyperglycemia    preDM    Hyperlipidemia    Insomnia    Low back pain radiating to both legs 01/16/2017   Measles as a child   Overweight(278.02)    Peripheral neuropathy 08/07/2019   Personal history of colonic polyps 10/27/2012   Follows with [facility]   Pneumonia    " walking"   Preventative health care    Testosterone deficiency 05/23/2012   Wears glasses     Tobacco History: Social History   Tobacco Use  Smoking Status Former   Packs/day: 1.00   Years: 20.00   Total pack years: 20.00   Types: Cigarettes   Quit date: 07/18/1989   Years since quitting: 32.9  Smokeless Tobacco Never  Tobacco Comments   Former smoker 03/16/22   Counseling given: Not Answered Tobacco comments: Former smoker 03/16/22   Outpatient Medications Prior to Visit  Medication Sig Dispense Refill   albuterol (PROVENTIL HFA) 108 (90 Base) MCG/ACT inhaler TAKE 2 PUFFS BY MOUTH EVERY 6 HOURS AS NEEDED FOR WHEEZE OR SHORTNESS OF BREATH 8 g 5   atorvastatin (LIPITOR) 10 MG tablet TAKE 1 TABLET BY MOUTH EVERY DAY 90 tablet 1   B-D UF III MINI PEN NEEDLES 31G X 5 MM MISC USE WITH LANTUS 100 each 0   busPIRone (BUSPAR) 15 MG tablet Take 1 tablet (15 mg total) by mouth 2 (two) times daily. 60 tablet 11   dextromethorphan-guaiFENesin (MUCINEX DM) 30-600 MG 12hr tablet Take 1 tablet by mouth 2 (two) times daily.     famciclovir (FAMVIR) 500 MG tablet TAKE 1 TABLET BY MOUTH THREE TIMES A DAY 21 tablet 0   fluticasone (FLONASE) 50 MCG/ACT nasal spray SPRAY 2 SPRAYS INTO EACH NOSTRIL EVERY DAY 48 mL 1   Fluticasone-Umeclidin-Vilant (TRELEGY ELLIPTA) 200-62.5-25 MCG/ACT AEPB Inhale 1 puff into the lungs daily. 60 each 5    glucose blood (CONTOUR NEXT TEST) test strip Check blood sugar once daily 100 each 12   insulin glargine (LANTUS) 100 UNIT/ML Solostar Pen Inject 10 Units into the skin daily. 15 mL 0   metFORMIN (GLUCOPHAGE) 1000 MG tablet TAKE 1 TABLET (1,000 MG TOTAL) BY MOUTH TWICE A DAY WITH FOOD 180 tablet 0   Nintedanib (OFEV) 150 MG CAPS Take 1 capsule (150 mg total) by mouth 2 (two) times daily. 60 capsule 5   oxyCODONE-acetaminophen (PERCOCET/ROXICET) 5-325 MG tablet Take 1 tablet by mouth every 4 (four) hours as needed for moderate pain.     pantoprazole (PROTONIX) 40 MG tablet TAKE 1 TABLET BY MOUTH TWICE A DAY BEFORE A MEAL 180 tablet 1   predniSONE (DELTASONE) 10 MG tablet TAKE 1 TABLET (10 MG TOTAL) BY MOUTH DAILY WITH BREAKFAST. (Patient taking differently: Take 10 mg by mouth daily with breakfast. Taking 1/2 tablet daily) 30 tablet 3   pregabalin (LYRICA) 75 MG capsule TAKE 1 CAPSULE BY MOUTH TWICE A DAY (Patient taking differently: Take 75 mg by mouth 2 (two) times daily.) 60 capsule 3   sitaGLIPtin (JANUVIA) 50 MG tablet Take 1 tablet (50 mg total) by mouth daily. 30 tablet 3   tamsulosin (FLOMAX) 0.4 MG CAPS capsule TAKE 2 CAPSULES BY MOUTH EVERY DAY 180 capsule 3   tiZANidine (ZANAFLEX) 4 MG tablet TAKE 1 TABLET BY MOUTH EVERY 6 HOURS AS NEEDED 360 tablet 1   venlafaxine XR (EFFEXOR-XR) 150 MG 24  hr capsule Take 1 capsule (150 mg total) by mouth daily with breakfast. 90 capsule 0   warfarin (COUMADIN) 5 MG tablet TAKE 1 TO 2 TABLETS DAILY OR AS PRESCRIBED BY COUMADIN CLINIC 130 tablet 1   zolpidem (AMBIEN) 10 MG tablet Take 0.5-1 tablets (5-10 mg total) by mouth at bedtime as needed for sleep. 15 tablet 1   colchicine 0.6 MG tablet Take 1 tablet (0.6 mg total) by mouth 2 (two) times daily for 14 days. 28 tablet 0   COVID-19 mRNA vaccine 2023-2024 (COMIRNATY) syringe Inject into the muscle. (Patient not taking: Reported on 06/20/2022) 0.3 mL 0   STIOLTO RESPIMAT 2.5-2.5 MCG/ACT AERS Inhale 2 puffs  into the lungs daily. (Patient not taking: Reported on 06/20/2022) 1 each 5   Facility-Administered Medications Prior to Visit  Medication Dose Route Frequency Provider Last Rate Last Admin   testosterone cypionate (DEPOTESTOSTERONE CYPIONATE) injection 200 mg  200 mg Intramuscular Q14 Days Saguier, Percell Miller, PA-C   200 mg at 07/06/21 0263      Review of Systems  Review of Systems  Constitutional:  Positive for fatigue. Negative for unexpected weight change.  HENT: Negative.    Respiratory:  Positive for shortness of breath. Negative for cough, chest tightness and wheezing.   Cardiovascular: Negative.   Psychiatric/Behavioral:  Positive for sleep disturbance.    Physical Exam  BP 138/62 (BP Location: Left Arm, Patient Position: Sitting, Cuff Size: Normal)   Pulse 95   Temp 97.6 F (36.4 C) (Oral)   Ht '5\' 10"'$  (1.778 m)   Wt 188 lb 9.6 oz (85.5 kg)   SpO2 99%   BMI 27.06 kg/m  Physical Exam Constitutional:      Appearance: Normal appearance.  HENT:     Head: Normocephalic and atraumatic.  Cardiovascular:     Rate and Rhythm: Normal rate and regular rhythm.  Pulmonary:     Effort: Pulmonary effort is normal.     Breath sounds: No wheezing, rhonchi or rales.     Comments: Fine rales L>R; O2 97-100% RA  Neurological:     General: No focal deficit present.     Mental Status: He is alert and oriented to person, place, and time. Mental status is at baseline.  Psychiatric:        Mood and Affect: Mood normal.        Behavior: Behavior normal.        Thought Content: Thought content normal.        Judgment: Judgment normal.      Lab Results:  CBC    Component Value Date/Time   WBC 6.1 06/16/2022 1023   RBC 4.11 (L) 06/16/2022 1023   HGB 12.4 (L) 06/16/2022 1023   HGB 11.9 (L) 10/05/2021 1429   HGB 12.8 (L) 11/18/2020 1608   HCT 36.8 (L) 06/16/2022 1023   PLT 312 06/16/2022 1023   PLT 322 10/05/2021 1429   MCV 89.5 06/16/2022 1023   MCH 30.2 06/16/2022 1023   MCHC  33.7 06/16/2022 1023   RDW 14.9 06/16/2022 1023   LYMPHSABS 348 (L) 06/16/2022 1023   MONOABS 0.9 10/05/2021 1429   EOSABS 128 06/16/2022 1023   BASOSABS 31 06/16/2022 1023    BMET    Component Value Date/Time   NA 128 (L) 06/16/2022 1025   NA 134 01/25/2022 1416   K 4.4 06/16/2022 1025   CL 93 (L) 06/16/2022 1025   CO2 26 06/16/2022 1025   GLUCOSE 283 (H) 06/16/2022 1025   BUN  12 06/16/2022 1025   BUN 10 01/25/2022 1416   CREATININE 0.75 06/16/2022 1025   CALCIUM 8.5 (L) 06/16/2022 1025   GFRNONAA >60 10/05/2021 1429   GFRNONAA 84 12/04/2020 1523   GFRAA 98 12/04/2020 1523    BNP    Component Value Date/Time   BNP 82.0 07/17/2021 0508    ProBNP No results found for: "PROBNP"  Imaging: No results found.   Assessment & Plan:   IPF (idiopathic pulmonary fibrosis) (HCC) - Slowly progressive pulmonary fibrosis, intolerant to Esbriet. Started on OFEV in November 2023. He is tolerating medication well without any GI side effects. He has some associated fatigue. ILD symptom scale and liver function are stable. He is on prednisone '5mg'$  daily.  He is scheduled for CT chest on 07/13/2022 and pulmonary function testing in January to reassess lung function. No plans to taper off prednisone until follow-up testing has been completed. He has a follow-up scheduled in January with Dr. Chase Caller.   Chronic respiratory failure with hypoxia (HCC) - Stable; No daytime oxygen requirements.  - Continue oxygen at bedtime   Hyponatremia - Chronic; Baseline between 129-131. Denies change in mental status.    COPD (chronic obstructive pulmonary disease) (HCC) - Stable; Continue Trelegy 281mg one puff daily and as needed mucinex '600mg'$  twice daily for chest congestion    EMartyn Ehrich NP 06/20/2022

## 2022-06-20 NOTE — Assessment & Plan Note (Addendum)
-   Stable; Continue Trelegy 242mg one puff daily and as needed mucinex '600mg'$  twice daily for chest congestion

## 2022-06-20 NOTE — Assessment & Plan Note (Signed)
-   Stable; No daytime oxygen requirements.  - Continue oxygen at bedtime

## 2022-06-21 ENCOUNTER — Encounter: Payer: Self-pay | Admitting: Medical

## 2022-06-22 ENCOUNTER — Other Ambulatory Visit: Payer: Self-pay | Admitting: Family Medicine

## 2022-06-23 ENCOUNTER — Encounter: Payer: Self-pay | Admitting: Internal Medicine

## 2022-06-23 DIAGNOSIS — J9601 Acute respiratory failure with hypoxia: Secondary | ICD-10-CM | POA: Diagnosis not present

## 2022-06-23 DIAGNOSIS — R0602 Shortness of breath: Secondary | ICD-10-CM | POA: Diagnosis not present

## 2022-06-24 NOTE — Telephone Encounter (Signed)
Received fax from [facility] for [facility] patient assistance, patient's application has been DENIED due to current income exceeding eligibility limits. Denial letter will be sent to scan center for retention.   Will await new income documentation beginning in 2024 and attempt resubmission.

## 2022-06-26 ENCOUNTER — Other Ambulatory Visit: Payer: Self-pay | Admitting: Medical

## 2022-06-27 ENCOUNTER — Encounter: Payer: Self-pay | Admitting: Medical

## 2022-06-27 ENCOUNTER — Other Ambulatory Visit (HOSPITAL_COMMUNITY): Payer: Self-pay

## 2022-06-27 MED ORDER — OXYCODONE-ACETAMINOPHEN 5-325 MG PO TABS
1.0000 | ORAL_TABLET | Freq: Four times a day (QID) | ORAL | 0 refills | Status: DC | PRN
  Filled 2022-06-27: qty 120, 30d supply, fill #0

## 2022-06-27 MED ORDER — ZOLPIDEM TARTRATE 10 MG PO TABS: 5.0000 mg | ORAL_TABLET | Freq: Every evening | ORAL | 1 refills | Status: DC | PRN

## 2022-06-30 ENCOUNTER — Other Ambulatory Visit: Payer: Self-pay | Admitting: Medical

## 2022-06-30 ENCOUNTER — Ambulatory Visit (INDEPENDENT_AMBULATORY_CARE_PROVIDER_SITE_OTHER): Payer: Medicare Other | Admitting: Internal Medicine

## 2022-06-30 ENCOUNTER — Other Ambulatory Visit: Payer: Self-pay

## 2022-06-30 VITALS — BP 134/80 | HR 87 | Temp 97.9°F | Wt 193.0 lb

## 2022-06-30 DIAGNOSIS — M4624 Osteomyelitis of vertebra, thoracic region: Secondary | ICD-10-CM | POA: Diagnosis not present

## 2022-06-30 NOTE — Assessment & Plan Note (Signed)
He is clinically stable from the perspective of his chronic back pain after stopping antibiotics about 2 months ago.  His inflammatory markers are stable.  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  For now, will continue off antibiotics and follow up as needed.

## 2022-06-30 NOTE — Progress Notes (Signed)
[facility]  CHIEF COMPLAINT:    Follow up for discitis/OM  SUBJECTIVE:    Luis Brown is a 76 y.o. male with PMHx as below who presents to the clinic for discitis/OM.   Patient has a long history of spinal infection as noted in my note from 03/17/2021.  He was last seen by me on 05/05/22 at which time he decided to stop antibiotics and observe how he did after having most recently completed 8 weeks of tedizolid for his spinal infection followed by dalbavancin infusions every 2 weeks as a suppressive regimen since November 2022.  He returned for labs on 06/16/22 which showed that his inflammatory markers had remained within normal range.  He reports no major changes in his back pain symptoms since stopping antibiotics and reports his back is doing fine.   Please see A&P for the details of today's visit and status of the patient's medical problems.   Patient's Medications  New Prescriptions   No medications on file  Previous Medications   ALBUTEROL (PROVENTIL HFA) 108 (90 BASE) MCG/ACT INHALER    TAKE 2 PUFFS BY MOUTH EVERY 6 HOURS AS NEEDED FOR WHEEZE OR SHORTNESS OF BREATH   ATORVASTATIN (LIPITOR) 10 MG TABLET    TAKE 1 TABLET BY MOUTH EVERY DAY   B-D UF III MINI PEN NEEDLES 31G X 5 MM MISC    USE WITH LANTUS   BUSPIRONE (BUSPAR) 15 MG TABLET    Take 1 tablet (15 mg total) by mouth 2 (two) times daily.   COLCHICINE 0.6 MG TABLET    Take 1 tablet (0.6 mg total) by mouth 2 (two) times daily for 14 days.   COVID-19 MRNA VACCINE 2023-2024 (COMIRNATY) SYRINGE    Inject into the muscle.   DEXTROMETHORPHAN-GUAIFENESIN (MUCINEX DM) 30-600 MG 12HR TABLET    Take 1 tablet by mouth 2 (two) times daily.   FAMCICLOVIR (FAMVIR) 500 MG TABLET    TAKE 1 TABLET BY MOUTH THREE TIMES A DAY   FLUTICASONE (FLONASE) 50 MCG/ACT NASAL SPRAY    SPRAY 2 SPRAYS INTO EACH NOSTRIL EVERY DAY   FLUTICASONE-UMECLIDIN-VILANT (TRELEGY ELLIPTA) 200-62.5-25 MCG/ACT AEPB    Inhale  1 puff into the lungs daily.   GLUCOSE BLOOD (CONTOUR NEXT TEST) TEST STRIP    Check blood sugar once daily   INSULIN GLARGINE (LANTUS) 100 UNIT/ML SOLOSTAR PEN    Inject 10 Units into the skin daily.   METFORMIN (GLUCOPHAGE) 1000 MG TABLET    TAKE 1 TABLET (1,000 MG TOTAL) BY MOUTH TWICE A DAY WITH FOOD   NINTEDANIB (OFEV) 150 MG CAPS    Take 1 capsule (150 mg total) by mouth 2 (two) times daily.   OXYCODONE-ACETAMINOPHEN (PERCOCET/ROXICET) 5-325 MG TABLET    Take 1 tablet by mouth every 4 (four) hours as needed for moderate pain.   OXYCODONE-ACETAMINOPHEN (PERCOCET/ROXICET) 5-325 MG TABLET    Take 1 tablet by mouth every 6 (six) hours as needed for pain (frequency change)   PANTOPRAZOLE (PROTONIX) 40 MG TABLET    TAKE 1 TABLET BY MOUTH TWICE A DAY BEFORE A MEAL   PREDNISONE (DELTASONE) 10 MG TABLET    TAKE 1 TABLET (10 MG TOTAL) BY MOUTH DAILY WITH BREAKFAST.   PREGABALIN (LYRICA) 75 MG CAPSULE    TAKE 1 CAPSULE BY MOUTH TWICE A DAY   SITAGLIPTIN (JANUVIA) 50 MG TABLET    Take 1 tablet (50 mg total) by mouth daily.   STIOLTO  RESPIMAT 2.5-2.5 MCG/ACT AERS    Inhale 2 puffs into the lungs daily.   TAMSULOSIN (FLOMAX) 0.4 MG CAPS CAPSULE    TAKE 2 CAPSULES BY MOUTH EVERY DAY   TIZANIDINE (ZANAFLEX) 4 MG TABLET    TAKE 1 TABLET BY MOUTH EVERY 6 HOURS AS NEEDED   VENLAFAXINE XR (EFFEXOR-XR) 150 MG 24 HR CAPSULE    TAKE 1 CAPSULE BY MOUTH DAILY WITH BREAKFAST.   WARFARIN (COUMADIN) 5 MG TABLET    TAKE 1 TO 2 TABLETS DAILY OR AS PRESCRIBED BY COUMADIN CLINIC   ZOLPIDEM (AMBIEN) 10 MG TABLET    Take 0.5-1 tablets (5-10 mg total) by mouth at bedtime as needed for sleep.  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   Esophageal reflux 02/10/2015   Fatigue    HTN (hypertension)    Hyperglycemia    preDM    Hyperlipidemia    Insomnia    Low back pain radiating to both legs 01/16/2017   Measles as a child   Overweight(278.02)    Peripheral neuropathy 08/07/2019   Personal history of colonic polyps 10/27/2012   Follows with [facility]   Pneumonia    " walking"   Preventative health care    Testosterone deficiency 05/23/2012   Wears glasses     Social History   Tobacco Use   Smoking status: Former    Packs/day: 1.00    Years: 20.00    Total pack years: 20.00    Types: Cigarettes    Quit date: 07/18/1989    Years since quitting: 32.9   Smokeless tobacco: Never   Tobacco comments:    Former smoker 03/16/22  Vaping Use   Vaping Use: Never used  Substance Use Topics   Alcohol use: Yes    Alcohol/week: 2.0 standard drinks of alcohol    Types: 2 Standard drinks or equivalent per week    Comment: couple beers here and there 03/16/22   Drug use: No    Family History  Problem Relation Age of Onset   Hypertension Mother    Diabetes Mother        type 2   Cancer Mother 56       breast in remission   Emphysema Father        smoker   COPD Father        smoker   Stroke Father 66       mini   Heart disease Father    Hypertension Sister    Hyperlipidemia Sister    Scoliosis Sister    Osteoporosis Sister    Hypertension Maternal Grandmother    Scoliosis Maternal Grandmother    Heart disease Maternal Grandfather    Heart disease Paternal Grandfather        smoker   Heart disease Daughter     Allergies  Allergen Reactions  Daptomycin     Drug induced pneumonia    Diltiazem Other (See Comments)    Passed out     Review of Systems  Constitutional:  Negative for fever.  Musculoskeletal:  Negative for back pain.     OBJECTIVE:    Vitals:   06/30/22 1026  BP: 134/80  Pulse: 87  Temp: 97.9 F (36.6 C)  TempSrc: Temporal  SpO2: 100%  Weight: 193 lb (87.5 kg)   Body mass index is 27.69 kg/m.  Physical Exam Constitutional:      Appearance: Normal appearance.  Pulmonary:     Effort: Pulmonary effort is normal. No respiratory distress.  Skin:    General: Skin is warm and dry.  Neurological:     General: No focal deficit present.     Mental Status: He is alert and oriented to person, place, and time.      Labs and Microbiology:    Latest Ref Rng & Units 06/16/2022   10:23 AM 04/25/2022   10:29 AM 03/15/2022   10:47 AM  CBC  WBC 3.8 - 10.8 Thousand/uL 6.1  CANCELED  8.2   Hemoglobin 13.2 - 17.1 g/dL 12.4   12.1   Hematocrit 38.5 - 50.0 % 36.8   35.5   Platelets 140 - 400 Thousand/uL 312   342.0       Latest Ref Rng & Units 06/16/2022   10:25 AM 05/19/2022    1:43 PM 03/23/2022    7:52 AM  CMP  Glucose 65 - 99 mg/dL 283   159   BUN 7 - 25 mg/dL 12   13   Creatinine 0.70 - 1.28 mg/dL 0.75   0.78   Sodium 135 - 146 mmol/L 128   129   Potassium 3.5 - 5.3 mmol/L 4.4   4.4   Chloride 98 - 110 mmol/L 93   91   CO2 20 - 32 mmol/L 26   30   Calcium 8.6 - 10.3 mg/dL 8.5   9.2   Total Protein 6.1 - 8.1 g/dL 5.7  6.5  6.0   Total Bilirubin 0.2 - 1.2 mg/dL 0.7  0.7  0.7   Alkaline Phos 39 - 117 U/L  88  90   AST 10 - 35 U/L '30  24  21   '$ ALT 9 - 46 U/L '24  18  15       '$ ASSESSMENT & PLAN:    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HCC) He is clinically stable from the perspective of his chronic back pain after stopping antibiotics about 2 months ago.  His inflammatory markers are stable.  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  For now, will continue off antibiotics and follow up as needed.       [facility] 06/30/2022, 10:49 AM

## 2022-07-06 ENCOUNTER — Ambulatory Visit: Payer: Medicare Other | Attending: Cardiology

## 2022-07-06 DIAGNOSIS — I480 Paroxysmal atrial fibrillation: Secondary | ICD-10-CM

## 2022-07-06 DIAGNOSIS — Z7901 Long term (current) use of anticoagulants: Secondary | ICD-10-CM

## 2022-07-06 DIAGNOSIS — I4891 Unspecified atrial fibrillation: Secondary | ICD-10-CM

## 2022-07-06 LAB — POCT INR: INR: 4.8 — AB (ref 2.0–3.0)

## 2022-07-06 NOTE — Patient Instructions (Signed)
Description   HOLD Thursday and Friday's dose and then START Warfarin 1.5 tablets daily except for 1 tablet on Mondays and Wednesdays.  Recheck INR in 2 weeks.  Coumadin Clinic# [phone]

## 2022-07-07 ENCOUNTER — Other Ambulatory Visit: Payer: Self-pay | Admitting: Medical

## 2022-07-13 ENCOUNTER — Ambulatory Visit
Admission: RE | Admit: 2022-07-13 | Discharge: 2022-07-13 | Disposition: A | Discharge status: Home / Self Care | Payer: Medicare Other | Source: Ambulatory Visit | Attending: Primary Care | Admitting: Primary Care

## 2022-07-13 ENCOUNTER — Other Ambulatory Visit (HOSPITAL_COMMUNITY): Payer: Self-pay

## 2022-07-13 DIAGNOSIS — G894 Chronic pain syndrome: Secondary | ICD-10-CM | POA: Diagnosis not present

## 2022-07-13 DIAGNOSIS — J9811 Atelectasis: Secondary | ICD-10-CM | POA: Diagnosis not present

## 2022-07-13 DIAGNOSIS — M961 Postlaminectomy syndrome, not elsewhere classified: Secondary | ICD-10-CM | POA: Diagnosis not present

## 2022-07-13 DIAGNOSIS — M150 Primary generalized (osteo)arthritis: Secondary | ICD-10-CM | POA: Diagnosis not present

## 2022-07-13 DIAGNOSIS — R918 Other nonspecific abnormal finding of lung field: Secondary | ICD-10-CM | POA: Diagnosis not present

## 2022-07-13 DIAGNOSIS — M4624 Osteomyelitis of vertebra, thoracic region: Secondary | ICD-10-CM | POA: Diagnosis not present

## 2022-07-13 DIAGNOSIS — J84112 Idiopathic pulmonary fibrosis: Secondary | ICD-10-CM

## 2022-07-13 DIAGNOSIS — J479 Bronchiectasis, uncomplicated: Secondary | ICD-10-CM | POA: Diagnosis not present

## 2022-07-13 DIAGNOSIS — J432 Centrilobular emphysema: Secondary | ICD-10-CM | POA: Diagnosis not present

## 2022-07-13 MED ORDER — OXYCODONE-ACETAMINOPHEN 5-325 MG PO TABS
1.0000 | ORAL_TABLET | ORAL | 0 refills | Status: DC | PRN
  Filled 2022-07-16 (×3): qty 180, 30d supply, fill #0

## 2022-07-15 ENCOUNTER — Ambulatory Visit
Admission: RE | Admit: 2022-07-15 | Discharge: 2022-07-15 | Disposition: A | Discharge status: Home / Self Care | Payer: Medicare Other | Source: Ambulatory Visit | Attending: Family Medicine | Admitting: Family Medicine

## 2022-07-15 ENCOUNTER — Other Ambulatory Visit (HOSPITAL_COMMUNITY): Payer: Self-pay

## 2022-07-15 VITALS — BP 103/63 | HR 98 | Temp 99.0°F | Resp 15

## 2022-07-15 DIAGNOSIS — Z8709 Personal history of other diseases of the respiratory system: Secondary | ICD-10-CM | POA: Diagnosis not present

## 2022-07-15 DIAGNOSIS — J329 Chronic sinusitis, unspecified: Secondary | ICD-10-CM | POA: Diagnosis not present

## 2022-07-15 NOTE — Discharge Instructions (Addendum)
Your lungs sound very good on exam today.  I am not concerned about any possibility of wheezing, congestion, pneumonia or bronchitis at this time.  After reviewing her medications and discussing how you are taking them with you, I am confident that your pulmonary fibrosis is being well-managed by your primary care provider and your lung doctor.  Please do not hesitate to return here to urgent care if you feel that your cough is worsening or you are becoming more short of breath.  We are happy to see you anytime.  Thank you for visiting urgent care today.

## 2022-07-15 NOTE — ED Provider Notes (Signed)
[facility]    CSN: [id number] Arrival date & time: 07/15/22  1337    HISTORY   Chief Complaint  Patient presents with   Cough    Sore throat, upper chest pain - not sure if it's allergies/sinus. I'm at high risk for respiratory illness due to pulmonary fibrosis. I've had all vaccines. - Entered by patient   Sore Throat   HPI ZYRUS HETLAND is a pleasant, 76 y.o. male who presents to urgent care today. Patient complains of pain in his upper chest and sore throat.  Patient states he is at high risk of respiratory illness due to history of pulmonary fibrosis.  Patient states he has had "all vaccines".  Patient states his symptoms began 5 days ago.  Patient states he has been taking Mucinex DM without meaningful relief.  Patient has normal vital signs on arrival today.  Patient states he is a former smoker.  Medication list was reviewed with patient in detail by me during visit today.    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   Esophageal reflux 02/10/2015   Fatigue    HTN (hypertension)    Hyperglycemia    preDM    Hyperlipidemia    Insomnia    Low back pain radiating to both legs 01/16/2017   Measles as a child   Overweight(278.02)    Peripheral neuropathy 08/07/2019   Personal history of colonic polyps 10/27/2012   Follows with [facility]   Pneumonia    " walking"   Preventative health care    Testosterone deficiency 05/23/2012   Wears glasses    Patient Active Problem List   Diagnosis Date Noted    Secondary hypercoagulable state ([place]) 03/16/2022   IPF (idiopathic pulmonary fibrosis) ([place]) 12/30/2021   Atrial fibrillation ([place]) 09/10/2021   Long term (current) use of anticoagulants 09/10/2021   Diabetes mellitus due to underlying condition with hyperosmolarity without coma, with long-term current use of insulin ([place]) 09/10/2021   Mixed hyperlipidemia 09/10/2021   Aspiration pneumonitis ([place]) 08/07/2021   Chronic respiratory failure with hypoxia ([place]) 08/07/2021   Pulmonary emboli ([place]) 07/16/2021   Bronchiectasis with acute exacerbation ([place]) 07/16/2021   History of DVT (deep vein thrombosis) 07/15/2021   Paroxysmal A-fib ([place]) 07/15/2021   COPD (chronic obstructive pulmonary disease) ([place]) 02/10/2021   Palpitations 02/01/2021   Wears glasses 01/11/2021   Pneumothorax, right 12/15/2020   Anxiety 12/01/2020   SOB (shortness of breath)    Nocturnal hypoxia 11/14/2020   S/P bronchoscopy 09/14/2020   Gastric ulcer 01/27/2020   H/O colonoscopy with polypectomy 01/27/2020   Protein-calorie malnutrition, severe 01/09/2020   Abdominal pain 12/30/2019   Weight loss, abnormal 12/30/2019   Change in bowel habits 12/30/2019   Medication monitoring encounter 08/26/2019   Peripheral neuropathy 08/07/2019   Hyponatremia 05/05/2019   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[place]) 04/04/2019   Fusion of spine of thoracolumbar region 03/12/2019   Status post thoracic spinal fusion 02/12/2019   Thoracic spinal  stenosis 04/17/2017   Painful orthopaedic hardware ([place]) 04/17/2017   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 ([place]) 04/17/2017   Low back pain radiating to both legs 01/16/2017   Esophageal reflux 02/10/2015   Essential hypertension 06/25/2014   Asthma 06/25/2014   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   Overweight 06/25/2014   Other abnormal glucose 06/25/2014   Spinal stenosis of lumbar region without  neurogenic claudication 04/09/2014   Flatback syndrome 04/09/2014   Chicken pox 10/21/2013   History of colonic polyps 10/27/2012   Testosterone deficiency 05/23/2012   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   Benign enlargement of prostate 04/23/2012   ED (erectile dysfunction) 04/23/2012   Allergic state 10/25/2011   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    Past Surgical History:  Procedure Laterality Date   ATRIAL FIBRILLATION ABLATION N/A 01/27/2022   Procedure: ATRIAL FIBRILLATION ABLATION;  Surgeon: Constance Haw, MD;  Location: [facility];  Service: Cardiovascular;  Laterality: N/A;   BACK SURGERY  2012 and 1994   Dr Margret Chance, screws and cage in low back   BIOPSY  01/09/2020   Procedure: BIOPSY;  Surgeon: Otis Brace, MD;  Location: [facility];  Service: Gastroenterology;;   BRONCHIAL BIOPSY  11/17/2020   Procedure: BRONCHIAL BIOPSIES;  Surgeon: Laurin Coder, MD;  Location: [facility];  Service: Endoscopy;;   BRONCHIAL BRUSHINGS  11/17/2020   Procedure: BRONCHIAL BRUSHINGS;  Surgeon: Laurin Coder, MD;  [facility];  Service: Endoscopy;;   BRONCHIAL WASHINGS  11/17/2020   Procedure: BRONCHIAL WASHINGS;  Surgeon: Laurin Coder, MD;  [facility];  Service: Endoscopy;;   COLONOSCOPY WITH PROPOFOL N/A 01/09/2020   Procedure: COLONOSCOPY WITH PROPOFOL;  Surgeon: Otis Brace, MD;  [facility];  Service: Gastroenterology;  Laterality: N/A;   ESOPHAGOGASTRODUODENOSCOPY (EGD) WITH PROPOFOL N/A 01/09/2020   Procedure: ESOPHAGOGASTRODUODENOSCOPY (EGD) WITH PROPOFOL;  Surgeon: Otis Brace, MD;  [facility];  Service: Gastroenterology;  Laterality: N/A;   EYE SURGERY Bilateral    2016   HARDWARE REVISION  03/12/2019   REVISION OF HARDWARE  THORACIC TEN-THORACIC ELEVEN WITH APPLICATION OF ADDITIONAL RODS AND ROD SLEEVES THORACIC EIGHT-THORACIC TWELVE 12-LUMBAR ONE (N/A )   HEMOSTASIS CONTROL  11/17/2020   Procedure: HEMOSTASIS CONTROL;  Surgeon: Laurin Coder, MD;  [facility];  Service: Endoscopy;;   IR US GUIDE BX ASP/DRAIN  11/13/2020   LAMINECTOMY  02/12/2019   DECOMPRESSIVE LAMINECTOMY THORACIC NINE-THORACIC TEN AND THORACIC TEN-THORACIC ELEVEN, EXTENSION OF THORACOLUMBAR FUSION FROM THORACIC TEN TO THORACIC FIVE, LOCAL BONE GRAFT, ALLOGRAFT AND VIVIGEN (N/A)   POLYPECTOMY  01/09/2020   Procedure: POLYPECTOMY;  Surgeon: Otis Brace, MD;  [facility];  Service: Gastroenterology;;   REMOVAL OF RODS AND PEDICLE SCREWS T5 AND T6, EXPLORATION OF FUSION, BIOPSY TRANSPEDICULAR T5 AND T6 (N/A )  09/14/2020   SPINAL FUSION N/A 02/12/2019   Procedure: DECOMPRESSIVE LAMINECTOMY THORACIC NINE-THORACIC TEN AND THORACIC TEN-THORACIC ELEVEN, EXTENSION OF THORACOLUMBAR FUSION FROM THORACIC TEN TO THORACIC FIVE, LOCAL BONE GRAFT, ALLOGRAFT AND VIVIGEN;  Surgeon: Jessy Oto, MD;  Location: [facility];  Service: Orthopedics;  Laterality: N/A;  DECOMPRESSIVE LAMINECTOMY THORACIC NINE-THORACIC TEN AND THORACIC TEN-THORACIC ELEVEN, EXTENSION OF TH   SPINAL FUSION N/A 03/12/2019   Procedure: REVISION OF HARDWARE THORACIC  TEN-THORACIC ELEVEN WITH APPLICATION OF ADDITIONAL RODS AND ROD SLEEVES THORACIC EIGHT-THORACIC TWELVE 12-LUMBAR ONE;  Surgeon: Jessy Oto, MD;  Location: [facility];  Service: Orthopedics;  Laterality: N/A;   TONSILLECTOMY     as child   torn rotator cuff  2010   right   VIDEO BRONCHOSCOPY N/A 11/17/2020   Procedure: VIDEO BRONCHOSCOPY WITH FLUORO;  Surgeon: Laurin Coder, MD;  [facility];  Service: Endoscopy;  Laterality: N/A;    Home Medications    Prior to Admission medications   Medication Sig Start Date End Date Taking? Authorizing Provider  albuterol (PROVENTIL HFA) 108 (90 Base)  MCG/ACT inhaler TAKE 2 PUFFS BY MOUTH EVERY 6 HOURS AS NEEDED FOR WHEEZE OR SHORTNESS OF BREATH 06/01/22   Brand Males, MD  atorvastatin (LIPITOR) 10 MG tablet TAKE 1 TABLET BY MOUTH EVERY DAY 08/17/21   Saguier, Percell Miller, PA-C  B-D UF III MINI PEN NEEDLES 31G X 5 MM MISC USE WITH LANTUS 06/11/22   Saguier, Percell Miller, PA-C  busPIRone (BUSPAR) 15 MG tablet Take 1 tablet (15 mg total) by mouth 2 (two) times daily. 06/30/22   [name], MD  colchicine 0.6 MG tablet Take 1 tablet (0.6 mg total) by mouth 2 (two) times daily for 14 days. 01/27/22 05/05/22  Shirley Friar, PA-C  dextromethorphan-guaiFENesin (MUCINEX DM) 30-600 MG 12hr tablet Take 1 tablet by mouth 2 (two) times daily.    [provider]  famciclovir (FAMVIR) 500 MG tablet TAKE 1 TABLET BY MOUTH THREE TIMES A DAY 05/18/22   Saguier, Percell Miller, PA-C  fluticasone [facility]) 50 MCG/ACT nasal spray SPRAY 2 SPRAYS INTO EACH NOSTRIL EVERY DAY 05/02/22   Saguier, Percell Miller, PA-C  Fluticasone-Umeclidin-Vilant (TRELEGY ELLIPTA) 200-62.5-25 MCG/ACT AEPB Inhale 1 puff into the lungs daily. 04/11/22   Brand Males, MD  glucose blood (CONTOUR NEXT TEST) test strip Check blood sugar once daily 02/25/19   Saguier, Percell Miller, PA-C  insulin glargine (LANTUS) 100 UNIT/ML Solostar Pen Inject 10 Units into the skin daily. 03/14/22   Saguier, Percell Miller, PA-C  JANUVIA 50 MG tablet TAKE 1 TABLET BY MOUTH EVERY DAY 07/07/22   Saguier, Percell Miller, PA-C  metFORMIN (GLUCOPHAGE) 1000 MG tablet TAKE 1 TABLET (1,000 MG TOTAL) BY MOUTH TWICE A DAY WITH FOOD 10/18/21   Saguier, Percell Miller, PA-C  Nintedanib (OFEV) 150 MG CAPS Take 1 capsule (150 mg total) by mouth 2 (two) times daily. 05/31/22   Brand Males, MD  oxyCODONE-acetaminophen (PERCOCET/ROXICET) 5-325 MG tablet Take 1 tablet by mouth every 4 (four) hours as needed for moderate pain. 06/14/21   [provider]  oxyCODONE-acetaminophen (PERCOCET/ROXICET) 5-325 MG tablet Take 1 tablet by mouth every 6  (six) hours as needed for pain (frequency change) 06/27/22     oxyCODONE-acetaminophen (PERCOCET/ROXICET) 5-325 MG tablet Take 1 tablet by mouth every 4 (four) hours as needed for pain. 07/16/22     pantoprazole (PROTONIX) 40 MG tablet TAKE 1 TABLET BY MOUTH TWICE A DAY BEFORE A MEAL 11/30/21   Saguier, Percell Miller, PA-C  predniSONE (DELTASONE) 10 MG tablet TAKE 1 TABLET (10 MG TOTAL) BY MOUTH DAILY WITH BREAKFAST. Patient taking differently: Take 10 mg by mouth daily with breakfast. Taking 1/2 tablet daily 01/04/22   Brand Males, MD  pregabalin (LYRICA) 75 MG capsule TAKE 1 CAPSULE BY MOUTH TWICE A DAY Patient taking differently: Take 75 mg by mouth 2 (two) times daily. 03/09/20   Jessy Oto, MD  STIOLTO RESPIMAT 2.5-2.5 MCG/ACT AERS Inhale 2 puffs into the lungs daily.  Patient not taking: Reported on 06/20/2022 02/23/22   Brand Males, MD  tamsulosin (FLOMAX) 0.4 MG CAPS capsule TAKE 2 CAPSULES BY MOUTH EVERY DAY 06/06/22   Saguier, Percell Miller, PA-C  tiZANidine (ZANAFLEX) 4 MG tablet TAKE 1 TABLET BY MOUTH EVERY 6 HOURS AS NEEDED 04/11/22   Jessy Oto, MD  venlafaxine XR (EFFEXOR-XR) 150 MG 24 hr capsule TAKE 1 CAPSULE BY MOUTH DAILY WITH BREAKFAST. 06/27/22   [name], MD  warfarin (COUMADIN) 5 MG tablet TAKE 1 TO 2 TABLETS DAILY OR AS PRESCRIBED BY COUMADIN CLINIC 04/27/22   Camnitz, Ocie Doyne, MD  zolpidem (AMBIEN) 10 MG tablet Take 0.5-1 tablets (5-10 mg total) by mouth at bedtime as needed for sleep. 06/27/22 07/27/22  [name], MD    Family History Family History  Problem Relation Age of Onset   Hypertension Mother    Diabetes Mother        type 2   Cancer Mother 22       breast in remission   Emphysema Father        smoker   COPD Father        smoker   Stroke Father 30       mini   Heart disease Father    Hypertension Sister    Hyperlipidemia Sister    Scoliosis Sister    Osteoporosis Sister    Hypertension Maternal Grandmother    Scoliosis Maternal  Grandmother    Heart disease Maternal Grandfather    Heart disease Paternal Grandfather        smoker   Heart disease Daughter    Social History Social History   Tobacco Use   Smoking status: Former    Packs/day: 1.00    Years: 20.00    Total pack years: 20.00    Types: Cigarettes    Quit date: 07/18/1989    Years since quitting: 33.0   Smokeless tobacco: Never   Tobacco comments:    Former smoker 03/16/22  Vaping Use   Vaping Use: Never used  Substance Use Topics   Alcohol use: Yes    Alcohol/week: 2.0 standard drinks of alcohol    Types: 2 Standard drinks or equivalent per week    Comment: couple beers here and there 03/16/22   Drug use: No   Allergies   Daptomycin and Diltiazem  Review of Systems Review of Systems Pertinent findings revealed after performing a 14 point review of systems has been noted in the history of present illness.  Physical Exam Triage Vital Signs ED Triage Vitals  Enc Vitals Group     BP 05/14/21 0827 (!) 147/82     Pulse Rate 05/14/21 0827 72     Resp 05/14/21 0827 18     Temp 05/14/21 0827 98.3 F (36.8 C)     Temp Source 05/14/21 0827 Oral     SpO2 05/14/21 0827 98 %     Weight --      Height --      Head Circumference --      Peak Flow --      Pain Score 05/14/21 0826 5     Pain Loc --      Pain Edu? --      Excl. in [place]? --   No data found.  Updated Vital Signs BP 103/63 (BP Location: Right Arm)   Pulse 98   Temp 99 F (37.2 C) (Oral)   Resp 15   SpO2 96%   Physical  Exam Vitals and nursing note reviewed.  Constitutional:      General: He is not in acute distress.    Appearance: Normal appearance. He is not ill-appearing.  HENT:     Head: Normocephalic and atraumatic.     Salivary Glands: Right salivary gland is not diffusely enlarged or tender. Left salivary gland is not diffusely enlarged or tender.     Right Ear: Tympanic membrane, ear canal and external ear normal. No drainage. No middle ear effusion. There is no  impacted cerumen. Tympanic membrane is not erythematous or bulging.     Left Ear: Tympanic membrane, ear canal and external ear normal. No drainage.  No middle ear effusion. There is no impacted cerumen. Tympanic membrane is not erythematous or bulging.     Nose: Nose normal. No nasal deformity, septal deviation, mucosal edema, congestion or rhinorrhea.     Right Turbinates: Not enlarged, swollen or pale.     Left Turbinates: Not enlarged, swollen or pale.     Right Sinus: No maxillary sinus tenderness or frontal sinus tenderness.     Left Sinus: No maxillary sinus tenderness or frontal sinus tenderness.     Mouth/Throat:     Lips: Pink. No lesions.     Mouth: Mucous membranes are moist. No oral lesions.     Pharynx: Oropharynx is clear. Uvula midline. No posterior oropharyngeal erythema or uvula swelling.     Tonsils: No tonsillar exudate. 0 on the right. 0 on the left.  Eyes:     General: Lids are normal.        Right eye: No discharge.        Left eye: No discharge.     Extraocular Movements: Extraocular movements intact.     Conjunctiva/sclera: Conjunctivae normal.     Right eye: Right conjunctiva is not injected.     Left eye: Left conjunctiva is not injected.  Neck:     Trachea: Trachea and phonation normal.  Cardiovascular:     Rate and Rhythm: Normal rate and regular rhythm.     Pulses: Normal pulses.     Heart sounds: Normal heart sounds. No murmur heard.    No friction rub. No gallop.  Pulmonary:     Effort: Pulmonary effort is normal. No tachypnea, bradypnea, accessory muscle usage, prolonged expiration, respiratory distress or retractions.     Breath sounds: Normal breath sounds and air entry. No stridor, decreased air movement or transmitted upper airway sounds. No decreased breath sounds, wheezing, rhonchi or rales.  Chest:     Chest wall: No tenderness.  Musculoskeletal:        General: Normal range of motion.     Cervical back: Full passive range of motion without  pain, normal range of motion and neck supple. Normal range of motion.  Lymphadenopathy:     Cervical: No cervical adenopathy.  Skin:    General: Skin is warm and dry.     Findings: No erythema or rash.  Neurological:     General: No focal deficit present.     Mental Status: He is alert and oriented to person, place, and time.  Psychiatric:        Mood and Affect: Mood normal.        Behavior: Behavior normal.     Visual Acuity Right Eye Distance:   Left Eye Distance:   Bilateral Distance:    Right Eye Near:   Left Eye Near:    Bilateral Near:     UC Couse /  Diagnostics / Procedures:     Radiology No results found.  Procedures Procedures (including critical care time) EKG  Pending results:  Labs Reviewed - No data to display  Medications Ordered in UC: Medications - No data to display  UC Diagnoses / Final Clinical Impressions(s)   I have reviewed the triage vital signs and the nursing notes.  Pertinent labs & imaging results that were available during my care of the patient were reviewed by me and considered in my medical decision making (see chart for details).    Final diagnoses:  History of pulmonary fibrosis  Rhinosinusitis   Patient is well-appearing on exam today, patient reassured.  No changes recommended to current regimen.  Patient advised to follow-up anytime if he is at least better concerned about his breathing or other respiratory issues. Please see discharge instructions below for further details of plan of care as provided to patient. ED Prescriptions   None    PDMP not reviewed this encounter.  Disposition Upon Discharge:  Condition: stable for discharge home Home: take medications as prescribed; routine discharge instructions as discussed; follow up as advised.  Patient presented with an acute illness with associated systemic symptoms and significant discomfort requiring urgent management. In my opinion, this is a condition that a prudent  lay person (someone who possesses an average knowledge of health and medicine) may potentially expect to result in complications if not addressed urgently such as respiratory distress, impairment of bodily function or dysfunction of bodily organs.   Routine symptom specific, illness specific and/or disease specific instructions were discussed with the patient and/or caregiver at length.   As such, the patient has been evaluated and assessed, work-up was performed and treatment was provided in alignment with urgent care protocols and evidence based medicine.  Patient/parent/caregiver has been advised that the patient may require follow up for further testing and treatment if the symptoms continue in spite of treatment, as clinically indicated and appropriate.  If the patient was tested for COVID-19, Influenza and/or RSV, then the patient/parent/guardian was advised to isolate at home pending the results of his/her diagnostic coronavirus test and potentially longer if they're positive. I have also advised pt that if his/her COVID-19 test returns positive, it's recommended to self-isolate for at least 10 days after symptoms first appeared AND until fever-free for 24 hours without fever reducer AND other symptoms have improved or resolved. Discussed self-isolation recommendations as well as instructions for household member/close contacts as per the [facility] and [place] [facility], and also gave patient the [place] [facility] packet with this information.  Patient/parent/caregiver has been advised to return to the [facility] or PCP in 3-5 days if no better; to PCP or the Emergency Department if new signs and symptoms develop, or if the current signs or symptoms continue to change or worsen for further workup, evaluation and treatment as clinically indicated and appropriate  The patient will follow up with their current PCP if and as advised. If the patient does not currently have a PCP we will assist them in obtaining one.   The patient may  need specialty follow up if the symptoms continue, in spite of conservative treatment and management, for further workup, evaluation, consultation and treatment as clinically indicated and appropriate.  Patient/parent/caregiver verbalized understanding and agreement of plan as discussed.  All questions were addressed during visit.  Please see discharge instructions below for further details of plan.  Discharge Instructions:   Discharge Instructions      Your lungs sound very good on  exam today.  I am not concerned about any possibility of wheezing, congestion, pneumonia or bronchitis at this time.  After reviewing her medications and discussing how you are taking them with you, I am confident that your pulmonary fibrosis is being well-managed by your primary care provider and your lung doctor.  Please do not hesitate to return here to urgent care if you feel that your cough is worsening or you are becoming more short of breath.  We are happy to see you anytime.  Thank you for visiting urgent care today.      This office note has been dictated using Museum/gallery curator.  Unfortunately, this method of dictation can sometimes lead to typographical or grammatical errors.  I apologize for your inconvenience in advance if this occurs.  Please do not hesitate to reach out to me if clarification is needed.      Lynden Oxford Scales, PA-C 07/15/22 1459

## 2022-07-15 NOTE — ED Triage Notes (Signed)
Pt reports having cough, and sore throat.   Started: 07/10/22  Home interventions: mucinex dm

## 2022-07-16 ENCOUNTER — Other Ambulatory Visit (HOSPITAL_COMMUNITY): Payer: Self-pay

## 2022-07-18 ENCOUNTER — Other Ambulatory Visit: Payer: Self-pay

## 2022-07-19 ENCOUNTER — Other Ambulatory Visit (HOSPITAL_COMMUNITY): Payer: Self-pay

## 2022-07-19 MED ORDER — BACLOFEN 5 MG PO TABS
1.0000 | ORAL_TABLET | Freq: Two times a day (BID) | ORAL | 1 refills | Status: DC
  Filled 2022-07-19: qty 60, 30d supply, fill #0

## 2022-07-20 ENCOUNTER — Other Ambulatory Visit: Payer: Self-pay | Admitting: Internal Medicine

## 2022-07-20 ENCOUNTER — Other Ambulatory Visit (HOSPITAL_COMMUNITY): Payer: Self-pay

## 2022-07-20 ENCOUNTER — Ambulatory Visit: Payer: Medicare Other | Attending: Cardiology | Admitting: *Deleted

## 2022-07-20 DIAGNOSIS — Z7901 Long term (current) use of anticoagulants: Secondary | ICD-10-CM | POA: Diagnosis not present

## 2022-07-20 DIAGNOSIS — I480 Paroxysmal atrial fibrillation: Secondary | ICD-10-CM

## 2022-07-20 DIAGNOSIS — I4891 Unspecified atrial fibrillation: Secondary | ICD-10-CM

## 2022-07-20 LAB — POCT INR: INR: 3.4 — AB (ref 2.0–3.0)

## 2022-07-20 NOTE — Patient Instructions (Addendum)
Description   Do not take any warfarin tomorrow then continue taking Warfarin 1.5 tablets daily except for 1 tablet on Mondays and Wednesdays. Recheck INR in 2 weeks.  C[facility]mad[facility]# [phone]

## 2022-07-24 ENCOUNTER — Other Ambulatory Visit: Payer: Self-pay | Admitting: Medical

## 2022-07-24 ENCOUNTER — Encounter: Payer: Self-pay | Admitting: Family Medicine

## 2022-07-24 ENCOUNTER — Encounter: Payer: Self-pay | Admitting: Internal Medicine

## 2022-07-24 DIAGNOSIS — J9601 Acute respiratory failure with hypoxia: Secondary | ICD-10-CM | POA: Diagnosis not present

## 2022-07-24 DIAGNOSIS — R0602 Shortness of breath: Secondary | ICD-10-CM | POA: Diagnosis not present

## 2022-07-25 MED ORDER — TRELEGY ELLIPTA 200-62.5-25 MCG/ACT IN AEPB: 1.0000 | INHALATION_SPRAY | Freq: Every day | RESPIRATORY_TRACT | 5 refills | Status: DC

## 2022-07-27 ENCOUNTER — Other Ambulatory Visit: Payer: Self-pay

## 2022-07-27 ENCOUNTER — Other Ambulatory Visit: Payer: Self-pay | Admitting: Family Medicine

## 2022-07-27 MED ORDER — ZOLPIDEM TARTRATE 10 MG PO TABS: 5.0000 mg | ORAL_TABLET | Freq: Every evening | ORAL | 1 refills | Status: DC | PRN

## 2022-07-30 ENCOUNTER — Other Ambulatory Visit (HOSPITAL_COMMUNITY): Payer: Self-pay

## 2022-08-01 ENCOUNTER — Encounter: Payer: Self-pay | Admitting: Internal Medicine

## 2022-08-01 MED ORDER — TRELEGY ELLIPTA 200-62.5-25 MCG/ACT IN AEPB: 1.0000 | INHALATION_SPRAY | Freq: Every day | RESPIRATORY_TRACT | 5 refills | Status: DC

## 2022-08-02 ENCOUNTER — Ambulatory Visit (INDEPENDENT_AMBULATORY_CARE_PROVIDER_SITE_OTHER): Payer: Medicare Other | Admitting: Internal Medicine

## 2022-08-02 ENCOUNTER — Ambulatory Visit: Payer: Medicare Other | Admitting: Internal Medicine

## 2022-08-02 ENCOUNTER — Other Ambulatory Visit: Payer: Self-pay | Admitting: Internal Medicine

## 2022-08-02 ENCOUNTER — Telehealth: Payer: Self-pay | Admitting: Internal Medicine

## 2022-08-02 ENCOUNTER — Encounter: Payer: Self-pay | Admitting: Internal Medicine

## 2022-08-02 VITALS — BP 130/70 | HR 78 | Temp 98.0°F | Ht 70.0 in | Wt 183.0 lb

## 2022-08-02 DIAGNOSIS — Z5181 Encounter for therapeutic drug level monitoring: Secondary | ICD-10-CM

## 2022-08-02 DIAGNOSIS — J84112 Idiopathic pulmonary fibrosis: Secondary | ICD-10-CM

## 2022-08-02 DIAGNOSIS — Z86711 Personal history of pulmonary embolism: Secondary | ICD-10-CM

## 2022-08-02 DIAGNOSIS — R5383 Other fatigue: Secondary | ICD-10-CM | POA: Diagnosis not present

## 2022-08-02 DIAGNOSIS — J439 Emphysema, unspecified: Secondary | ICD-10-CM | POA: Diagnosis not present

## 2022-08-02 LAB — HEPATIC FUNCTION PANEL
ALT: 18 U/L (ref 0–53)
AST: 25 U/L (ref 0–37)
Albumin: 4.3 g/dL (ref 3.5–5.2)
Alkaline Phosphatase: 99 U/L (ref 39–117)
Bilirubin, Direct: 0.2 mg/dL (ref 0.0–0.3)
Total Bilirubin: 0.7 mg/dL (ref 0.2–1.2)
Total Protein: 6.6 g/dL (ref 6.0–8.3)

## 2022-08-02 LAB — PULMONARY FUNCTION TEST
DL/VA % pred: 87 %
DL/VA: 3.43 ml/min/mmHg/L
DLCO cor % pred: 57 %
DLCO cor: 14.29 ml/min/mmHg
DLCO unc % pred: 57 %
DLCO unc: 14.29 ml/min/mmHg
FEF 25-75 Pre: 2.09 L/sec
FEF2575-%Pred-Pre: 100 %
FEV1-%Pred-Pre: 82 %
FEV1-Pre: 2.43 L
FEV1FVC-%Pred-Pre: 108 %
FEV6-%Pred-Pre: 80 %
FEV6-Pre: 3.11 L
FEV6FVC-%Pred-Pre: 106 %
FVC-%Pred-Pre: 75 %
FVC-Pre: 3.12 L
Pre FEV1/FVC ratio: 78 %
Pre FEV6/FVC Ratio: 100 %

## 2022-08-02 NOTE — Addendum Note (Signed)
Addended by: Alvin Critchley on: 08/02/2022 01:42 PM   Modules accepted: Orders

## 2022-08-02 NOTE — Telephone Encounter (Signed)
Rx team: his ofev is costing $3100/month in copay. Any alternative?

## 2022-08-02 NOTE — Progress Notes (Signed)
Referring provider: Elise Benne  HPI: 77 year old male, former smoker (20-pack-year history).  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   Previous [facility] pulmonary encounters:  12/29/20- Dr. Chase Caller Chief Complaint  Patient presents with   Follow-up    PFT  performed 5/24.  Pt states since last visit, he has been doing okay. States he has lost some weight that he has noticed since last visit.    Follow-up drug-induced pneumonitis daptomycin from early May 2022  HPI Luis Brown 77 y.o. -returns for follow-up.  I personally saw him in the hospital on 11/20/2020.  At that point in time he had been started on Solu-Medrol on 11/18/2020.  When I saw him he was on day 3 of Solu-Medrol and he was needing 3 L nasal cannula oxygen at rest.  At that point in time even with 3 L he was desaturating to 86% just talking.  Put him on prednisone 60 mg/day.  After that he see nurse practitioner x2 at least 1 of which was a telephone visit..  As part of his follow-up on 12/11/2020 he had a CT scan of the chest [3 days after pulmonary function test] and on my personal visualization it showed new onset pneumothorax but significant improvement in infiltrates and inflammation.  He had another follow-up chest x-ray 12/17/2020 in which no pneumothorax reported [similar on 12/15/2020] and pulmonary infiltrates appear improved.  He is now here with his wife  He tells me he is now on 10 mg/day prednisone he just darted that today.  He is 1 week left and after that the prednisone taper ends.  Because of the pneumothorax is physical therapy has been on hold but 4 days ago he was evaluated and apparently his pulse ox was 100% but this was on oxygen.  He continues to use his  oxygen although he is not sure if he is desaturating anymore.  He does feel overall improved compared to 6 weeks ago but still has significant amount of fatigue.  He has significant insomnia which he believes is due to prednisone.  Today's last night was the first night he could sleep when he dropped his prednisone down to 10 mg/day.  He believes his fatigue is multifactorial related to the disease, multiple medications, lack of oxygen, insomnia etc.  02/09/2021 Patient presents today for 1 month follow-up. Since his last visit in June, he has established with cardiology and was dx with afib. He is currently on Xarelto and Digoxin. His wife states that he has had more energy recently. He is scheduled for testosterone injection tomorrow. He gets winded with stairs and heavy exertion. He reports intermittent wheezing. He needs refill of albuterol rescue inhaler. He has weaned himself of oxygen and O2 equipment has been picked up. He has been sleeping better at night. Anxiety has improved, he stopped xanax. Due for follow-up CXR today. 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  02/23/2021 Patient presents today for 2 week follow-up. During his last visit he was given trial Spiriva Respimat d/t dyspnea /wheezing symptoms and moderate obstruction seen on his PFTs. He has been using sample of Spiriva Respimat 2.20mg as prescribed, he occasionally misses a dose for 1-2 days. He states that his wheezing has stopped but he has not noticed a significant difference in  dyspnea symptoms. His ILD scale appears improved from two weeks go. He has having some muscle spasms in his back that started before being placed on LABA.   04/26/2021 -   Chief Complaint  Patient presents with   Follow-up    F/u, trelegy medication concerns    Luis Brown 77 y.o. -returns for follow-up.  Since I last saw him he seen nurse practitioner 2 times.  Symptom scores for  dyspnea continue to improve.  He was also walked for oxygen desaturation February 04, 2019 did not desaturate.  He says he continues to get better.  He is off prednisone.  Nurse practitioner noticed obstructive physiology on the pulmonary function test.  Put him on inhaler.  Currently is on Trelegy.  He says it helps him tremendously.  He feels it opens up his lungs.  He declined his flu shot today because he will have it after the COVID-vaccine in October or November 2022.   06/24/2021- Interim hx  Patient presents today for acute OV/cough. Patient reports increased shortness of breath since 06/13/21 with associated fatigue, cough and chest tightness. Cough is productive with clear mucus. Symptoms started after Thanksgiving. His wife had the flu at the same time. His cough initially improved while taking mucinex, he stopped since he was starting to feel better. Denies f/c/s, purulent mucus, N/V/D. CAT score 17.   Inpatient consult note 07/17/2022 by Dr. Chase Caller at [facility] This is a 77 year old male with poorly controlled diabetes, hemoglobin A1c 7.4, chronic opioid use for back pain, BPH on Flomax, anxiety and depression, paroxysmal A. fib on metoprolol digoxin and Xarelto .  He had long hx of back infection and was on daptomycin.  Then in  spring 2022 was admitted at [facility] with R pneumonitis considered to be daptomycin related pneumonitis.  Was treated with steroids.  Course complicated by right upper extremity DVT secondary to PICC line for which she was on Xarelto.  He is requiring oxygen at that time.  By the summer 2022 he was off oxygen and able to ambulate even without oxygen.  Course was also complicated later in May 2022 with spontaneous pneumothorax.  By the summer 2022 he was able to come off prednisone.  He also had diagnosed of atrial fibrillation for which he was on digoxin and Xarelto was continued [although originally started for DVT].  Last seen in clinic October  2022 at which time he was off oxygen and off steroids.  He was on Trelegy inhaler for pulmonary function test that back in May 2022 showed obstructive physiology.  He says he was feeling better.  He is only minimally symptomatic.  Plan at that time was to get a repeat high-resolution CT chest done in the spring 2023.   On Thanksgiving 2022 his wife had the flu and he was exposed to this and after that he started having increasing cough and fatigue.  Up until this point he was able to do walking and fishing.  He saw  pulm nurse practitioner 06/24/2021 in the pulmonary clinic.  Was given 8-day prednisone taper for presumed diagnosis of COPD exacerbation.  No antibiotic.  He did see infectious diseases at the same time and for his back infections it was noted, "10 days of linezolid + 8 weeks of tedizolid for his spinal infection and has now been started on dalbavancin infusions every 2 weeks last month as a suppressive regim" (none of which appear to be associated with drug-induced ILD]  He then called into the pulmonary office on 07/13/2021 felt he was desaturating mildly and coughing up green mucus and fevers..  2 COVID home test were negative.  He had a fever of 100.7.  He wanted some prednisone.  We gave him some Omnicef as well and a 5-day prednisone taper but also advised that to report to the ER.  He then presented on 07/15/2021 for admission with acute hypoxic respiratory failure 58% and a history of 1 week of cough and shortness of breath getting worse.  Appears he might not have taken Omnicef.  He had lactic acidosis 2.3 at admission, white count of nearly 15,000 with lymphopenia.  CT angiogram showed acute PE segmental and subsegmental right-sided [despite being on Xarelto] started on IV heparin started on Solu-Medrol and antibiotics . CT also showed L >>> R (opposted from may 2022) GGO. and .  His Doppler ultrasound lower extremities negative for DVT.   On 07/16/2021 he was stable on 4 L oxygen.   His flu, multiplex PCR and MRSA PCR were negative.  Procalcitonin 0.21.  Echo is normal with normal RV.  Then on 12/3 1/22 seen progressively more hypoxemic now requiring 8 L of oxygen.  Briefly required even more but looking comfortable.  When assisted to go to the bathroom even on 8 L he desaturated to 70s he has been afebrile since admission. Trop improved from 200  prior day -> 70, lactic 1.9 with normal BNP. CXR with ?> Worsening LEft sided air space disease and ccm consulted      OV 08/12/2021  Subjective:  [name], male , DOB: Oct 15, 1945 , age 77 y.o. , MRN: [id number] , ADDRESS: [place] [id number] PCP Mackie Pai, PA-C Patient Care Team: Saguier, Iris Pert as PCP - General (Internal Medicine) Berniece Salines, DO as PCP - Cardiology (Cardiology) Keene Breath., MD (Ophthalmology) Jessy Oto, MD as Consulting Physician (Orthopedic Surgery)  This Provider for this visit: Treatment Team:  Attending Provider: Brand Males, MD    08/12/2021 -   Chief Complaint  Patient presents with   Hospitalization Follow-up    Breathing has improved some but not back to baseline yet. He has some chest congestion and occ cough with clear to yellow sputum.    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  He also has paroxysmal A. fib, low testosterone, chronic hyponatremia, chronic opioid intake.,  Chronic Lyrica  HPI Luis Brown 77 y.o. -returns for office follow-up.  After last saw him in the office I did actually see him New Year's Eve in the hospital with acute on chronic hypoxemic respiratory failure at this admission he had a new diagnosis of pulmonary embolism that was deemed Xarelto failure [he was on Xarelto for DVT in his upper extremity from spring 2022.  Today I learned that he is actually taking testosterone for some years.  When he had the DVT in the spring 2022 after the discharge he was  on testosterone.  He says he took testosterone right up to the admission.  He is now getting subcutaneous Lovenox injections.  He wants to go to Coumadin because has been deemed as Xarelto failure.  Also prior to that admission he did have some infectious symptoms with colored sputum.  At the time of that admission he denies any aspiration.  He was discharged home on oxygen.  He was discharged on a prednisone taper.  Also discharged on oxygen [of note  he was on 10 L oxygen during the hospital stay]  At home he continued on oxygen was doing the prednisone taper but got readmitted 08/07/2021 for 1 day and discharged the following day 08/08/2021 following aspiration of a pill.  He says he has never had any aspiration.  He also and his wife also states that speech eval for swallow in the hospital was normal.  He denies any other choking episodes for pills or for food.  He was treated with antibiotics.  This time at discharge he was only on 2 L nasal cannula.  He was given another prednisone taper.  He tells me that on 14 August 2021 he will go to 30 mg/day for 10 days and then 20 mg daily for 10 days and then 10 mg daily for 10 days and stop   Walking desaturation test today on room air: At room air at rest his pulse ox was 95% for at least 20 minutes.  Then we decided to walk him 185 feet x 3 laps on room air:.  He only walked 1 lap and his pulse ox went down to 86% on room air and stopped.  His heart rate went up to 122/min.  His most recent chest x-ray was 5 days ago on 08/07/2021 at the time of the hospitalization second time for aspirating pill: This is improved compared to the admission 1.   His current symptom score is listed below.    OV 09/28/2021  Subjective:  [name], male , DOB: 04/27/1946 , age 63 y.o. , MRN: [id number] , ADDRESS: [place] [id number] PCP Mackie Pai, PA-C Patient Care Team: Saguier, Iris Pert as PCP - General (Internal  Medicine) Berniece Salines, DO as PCP - Cardiology (Cardiology) Keene Breath., MD (Ophthalmology) Jessy Oto, MD as Consulting Physician (Orthopedic Surgery)  This Provider for this visit: Treatment Team:  Attending Provider: Brand Males, MD    09/28/2021 -   Chief Complaint  Patient presents with   Follow-up    Pt states he still becomes SOB with exertion and also has an occasional cough. States the cough happens throughout the day and will get some phlegm up that is clear to yellow tinge.   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 And PE with "ILD Falare" Dec 2022/Jan 2023   He also has paroxysmal A. fib, low testosterone, chronic hyponatremia, chronic opioid intake.,  Chronic Lyrica  HPI Luis Brown 77 y.o. -returns for follow-up.  Since his last visit he has continued on prednisone 10 mg/day.  He continues to have shortness of breath.  In fact this is unchanged.  He does tell me though starting spring season he has had allergic rhinitis.  Wife states he has seasonal allergies for many years.  He is also having worsening cough.  He took a antihistamine and is somewhat better.  He continues to schedule Stiolto.  He has upcoming atrial fibrillation ablation end of March 2023.  Also in mid February 2023 had another CT angiogram chest that ruled out pulmonary embolism [done while on anticoagulation].  I personally visualized the CT chest and compared to late December 2023/early January 2023 the findings are better.  There might be some residual scarring and potential emphysema.  We walked him today and his walking desaturation test held up.  He did not desaturate.  There is a huge improvement.  Of note he was supposed have pulmonary function test today but  this was not performed.  He does not know why.  Apologized for any missteps.    For his pulmonary embolism: It appears that he might not be taking his testosterone shots  anymore.  Regarding seasonal allergies: He has not had work-up for this.  But currently it is active.     '@Patient'$  ID: Luis Brown, male    DOB: May 19, 1946, 77 y.o.   MRN: [id number]  Chief Complaint  Patient presents with   Follow-up    C/o increased sob x 2 wks., cough-yellow in am    Referring provider: Elise Benne  HPI: 77 year old male, former smoker (20-pack-year history).  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   Previous [facility] pulmonary encounters:  12/29/20- Dr. Chase Caller Chief Complaint  Patient presents with   Follow-up    PFT  performed 5/24.  Pt states since last visit, he has been doing okay. States he has lost some weight that he has noticed since last visit.    Follow-up drug-induced pneumonitis daptomycin from early May 2022  HPI Luis Brown 78 y.o. -returns for follow-up.  I personally saw him in the hospital on 11/20/2020.  At that point in time he had been started on Solu-Medrol on 11/18/2020.  When I saw him he was on day 3 of Solu-Medrol and he was needing 3 L nasal cannula oxygen at rest.  At that point in time even with 3 L he was desaturating to 86% just talking.  Put him on prednisone 60 mg/day.  After that he see nurse practitioner x2 at least 1 of which was a telephone visit..  As part of his follow-up on 12/11/2020 he had a CT scan of the chest [3 days after pulmonary function test] and on my personal visualization it showed new onset pneumothorax but significant improvement in infiltrates and inflammation.  He had another follow-up chest x-ray 12/17/2020 in which no pneumothorax reported [similar on 12/15/2020] and pulmonary infiltrates appear improved.  He is now here with his wife  He tells me he is now on  10 mg/day prednisone he just darted that today.  He is 1 week left and after that the prednisone taper ends.  Because of the pneumothorax is physical therapy has been on hold but 4 days ago he was evaluated and apparently his pulse ox was 100% but this was on oxygen.  He continues to use his oxygen although he is not sure if he is desaturating anymore.  He does feel overall improved compared to 6 weeks ago but still has significant amount of fatigue.  He has significant insomnia which he believes is due to prednisone.  Today's last night was the first night he could sleep when he dropped his prednisone down to 10 mg/day.  He believes his fatigue is multifactorial related to the disease, multiple medications, lack of oxygen, insomnia etc.  02/09/2021 Patient presents today for 1 month follow-up. Since his last visit in June, he has established with cardiology and was dx with afib. He is currently on Xarelto and Digoxin. His wife states that he has had more energy recently. He is scheduled for testosterone injection tomorrow. He gets winded with stairs and heavy exertion. He reports intermittent wheezing. He needs refill of albuterol rescue inhaler. He has weaned himself of oxygen and O2 equipment has been picked up. He has been  sleeping better at night. Anxiety has improved, he stopped xanax. Due for follow-up CXR today. 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  02/23/2021 Patient presents today for 2 week follow-up. During his last visit he was given trial Spiriva Respimat d/t dyspnea /wheezing symptoms and moderate obstruction seen on his PFTs. He has been using sample of Spiriva Respimat 2.80mg as prescribed, he occasionally misses a dose for 1-2 days. He states that his wheezing has stopped but he has not noticed a significant difference in dyspnea symptoms. His ILD scale appears improved from two weeks go. He has having some muscle spasms in his  back that started before being placed on LABA.   04/26/2021 -   Chief Complaint  Patient presents with   Follow-up    F/u, trelegy medication concerns    Luis BIR[age]ALL89y.o. -returns for follow-up.  Since I last saw him he seen nurse practitioner 2 times.  Symptom scores for dyspnea continue to improve.  He was also walked for oxygen desaturation February 04, 2019 did not desaturate.  He says he continues to get better.  He is off prednisone.  Nurse practitioner noticed obstructive physiology on the pulmonary function test.  Put him on inhaler.  Currently is on Trelegy.  He says it helps him tremendously.  He feels it opens up his lungs.  He declined his flu shot today because he will have it after the COVID-vaccine in October or November 2022.  06/24/2021 Patient presents today for acute OV/cough. Patient reports increased shortness of breath since 06/13/21 with associated fatigue, cough and chest tightness. Cough is productive with clear mucus. Symptoms started after Thanksgiving. His wife had the flu at the same time. His cough initially improved while taking mucinex, he stopped since he was starting to feel better. Denies f/c/s, purulent mucus, N/V/D. CAT score 17.    09/28/2021 -   Chief Complaint  Patient presents with   Follow-up    Pt states he still becomes SOB with exertion and also has an occasional cough. States the cough happens throughout the day and will get some phlegm up that is clear to yellow tinge.   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 And PE with "ILD Falare" Dec 2022/Jan 2023   He also has paroxysmal A. fib, low testosterone, chronic hyponatremia, chronic opioid intake.,  Chronic Lyrica  HPI Luis [id number].o. -returns for follow-up.  Since his last visit he has continued on prednisone 10 mg/day.  He continues to have shortness of breath.  In fact this is unchanged.  He does tell me though starting spring season he  has had allergic rhinitis.  Wife states he has seasonal allergies for many years.  He is also having worsening cough.  He took a antihistamine and is somewhat better.  He continues to schedule Stiolto.  He has upcoming atrial fibrillation ablation end of March 2023.  Also in mid February 2023 had another CT angiogram chest that ruled out pulmonary embolism [done while on anticoagulation].  I personally visualized the CT chest and compared to late December 2023/early January 2023 the findings are better.  There might be some residual scarring and potential emphysema.  We walked him today and his walking desaturation test held up.  He did not desaturate.  There is a huge improvement.  Of note he was supposed have pulmonary function test today but this was not performed.  He does  not know why.  Apologized for any missteps.    For his pulmonary embolism: It appears that he might not be taking his testosterone shots anymore.  Regarding seasonal allergies: He has not had work-up for this.  But currently it is active.   12/30/2021 Patient presents today for a 3 month follow-up with PFTs. Patient needs simple walk test, symptom scale and FENO today.  Patient is maintained on Stiolto. Has not required albuterol. He has chronic shortness of breath and fatigue symptoms which are significantly better since last visit. No cough. He remains on '10mg'$  prednisone daily. No oxygen reqiuirments during daytime. Using 1L oxygen at bedtime.  HRCT on 12/28/2021 showed regression of subpleural and basilar predominant fibrotic interstitial lung disease compared to May 2022.  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 PFTs showed improvement in FEV1 but decreased diffusion capacity. Eosinophil absolute was 100 and March.  Respiratory allergy panel was negative. FENO today was normal. Discussed starting antifibrtoic to slow progression of pulmonary fibrosis.   02/10/2022 Patient presents today for 1 month follow-up/ new antifibrotic start.  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 He started Esbriet 1.5 weeks ago d/t gout flare. He is tolerating medication well, currently on 2 tablets three times a day. No GI symptoms. Dyspnea appears some better overall. No acute respiratory symptoms. No active cough, mucinex-dm helps when needed. He took 3/4 mild walk the other day without issues. Continues on Stiolto, prn albuterol and '5mg'$  prednisone daily. Overnight oximetry test on 01/24/22 showed that patient spent 5 min with SPO2 less than 88%. SPO2 low 85% and baseline 93%. Patient started on 1L supplemental oxygen at bedtime.   03/24/2022- Interim hx  Patient presents today 2 month follow-up/ ILD. 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 Patient had spirometry with DLCO testing today. He was unable to tolerate antifibrotic medication Esbriet d/t nausea, headache and muscle aches. He was never able to get to full dose. He had labs checked with PCP yesterday, LFTs were normal. Sodium was low and needs recheck in 1 week   He reports increased sob symptoms last couple of weeks. He has associated wheezing and morning sputum production. He takes mucinex first thing in the morning which helps. He in on '5mg'$  prednisone daily. Spirometry showed decline in FEV1 and diffusion capacity compared to June.   Pulmonary testing 03/24/2022 Spirometry>> FVC 2.88 (69%), FEV1 2.23 (75%), ratio 78, DLCOcor 14.05 (56%)     hx OV 05/19/2022  Subjective:  [name], male , DOB: [birth year] , age 54 y.o. , MRN: [id number] , ADDRESS: [place] [phone] PCP [name], MD Patient Care Team: [name], MD as PCP - General (Family Medicine) Berniece Salines, DO as PCP - Cardiology (Cardiology) Constance Haw, MD as PCP - Electrophysiology (Cardiology) Keene Breath., MD (Ophthalmology) Jessy Oto, MD as Consulting Physician (Orthopedic Surgery)  This Provider for this visit: Treatment Team:  Attending Provider: Brand Males, MD   05/19/2022 -   Chief Complaint  Patient presents with   Follow-up    Pt states that he still has problems with SOB which is about the same since last visit.     HPI Luis Brown 78 y.o. -returns for follow-up.  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  It is now looking more more that he has IPF but if  not IPF progressive phenotype.  The UIP pattern is a bad prognostic sign.  He is feeling stable but his pulmonary function test today shows decline.  We tried him on pirfenidone but he did not tolerated.  He stated given body aches.  Now for the last 1 week he is on full dose nintedanib.  He is having some mild fatigue but no diarrhea.  Overall he feels he is tolerating the medicine well.  Last liver function test end of September 2023 before he went on the Internet.  He will have liver function test today.  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  He is also still on testosterone but he is on anticoagulation.  I did warn him about the risks of pulmonary embolism with testosterone he is already had pulm embolism.  He has had his RSV and flu vaccine and he plans to have his COVID mRNA booster.    06/20/2022- interim hx  Patient presents today for follow-up after starting anti-fibrotic medication.  He feels his breathing  is some worse over the last 4-5 months. When walking at his own pace he does all right.  Increased dyspnea symptoms with quick exertion.  Overall he does okay shopping but takes this time.  Not coughing much, prn mucinex helps loosen congestion. Mainly clears his throat d/t PND. He was started on OFEV in early November, currently taking '150mg'$  twice daily. He is tolerating medication except for fatigue. Fatigue is worse on some days.  Not sleeping well at night requiring naps.  He has had previous sleep study which was  negative for OSA. Maintained on Trelegy 21mg and wears oxygen at bedtime. He is taking Prednisone '5mg'$  daily. Liver function within normal limits in November. He is on BuSpar for anxiety which is helping. He takes oxycodone 6 times daily for chronic back pain, following with pain management. He is scheduled for CT chest on 07/13/22 and PFTs in January to re-assess lung function. He has an overview with Dr. RChase Calleron 08/02/21.       OV 08/02/2022  Subjective:  [name] male , DOB: [id number], age 77y.o. 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 fib, low testosterone, chronic hyponatremia, chronic opioid intake.,  Chronic Lyrica   HPI CTALOR [id number].o. -returns for follow-up.  Continue on prednisone 5 mg/day.  He is also on nintedanib.  He and his wife report nintedanib is $3100 per month and is quite expensive.  They are not able to get a lower co-pay.  Last it was $600 per month co-pay.  He feels fatigue because  of nintedanib.  It is mild to moderate.  He is willing to pulm rehabilitation.  He does have a long back scar and has had previous  back surgery but he feels he can handle pulmonary rehabilitation.  He is clear that his fatigue is because of the nintedanib but he does not want to stop it.  We reviewed his PFTs and CT scan and shows recent stability.  Therefore he is pleased about it and does not want to stop the nintedanib.  He is willing to put up with the fatigue.  He was wondering if his shortness of breath is getting worse because he thought because of the shortness of breath and ongoing cough and fatigue that he might be worse.  But his symptom score shows stability.  [See below]  Of note his wife is also here with him.  She also is worried about his high co-pay.  She is also reporting independently that his fatigue is related to nintedanib.  She also believes that he can do pulm rehabilitation and in fact was pleased that offered it.  SYMPTOM SCALE - ILD 08/12/2021 09/28/2021  12/30/2021  02/10/2022  03/24/2022  05/19/2022  06/20/2022  08/02/2022   Current weight   177lb  182 lbs 187# ofev x 1 week Ofev 150 bid Ofev 150 bid  O2 use 2L [place] but at rest he was 95%  RA RA  RA  ra RA   Shortness of Breath 0 -> 5 scale with 5 being worst (score 6 If unable to do)         At rest 1 1 0 0 0 '1 2 1  '$ Simple tasks - showers, clothes change, eating, shaving 3 2 0 0 0 '1 2 1  '$ Household (dishes, doing bed, laundry) '3 2 1 '$ 0 '3 1 2 '$ 1.5  Shopping xno 3 1 0 '3 2 2 '$ 1.5  Walking level at own pace 2 1.5 0 0 0 '2 1 1  '$ Walking up Stairs 3 2.'5 2 1 4 3 2 '$ 1.5  Total (30-36) Dyspnea Score '12 12 4 1 10 10 11 '$ 7/5  How bad is your cough? Not often 3.5 Not often 0 1 0 2 0  How bad is your fatigue A lot 2 2 2.'5 1 2 3 '$ frequent  How bad is nausea 0 0 0 0 0 0 0 0  How bad is vomiting?  0 0 0 0 0 0 0 0  How bad is diarrhea? 0 0 0 0 0 0 0 0  How bad is anxiety? some 1 0 0 0 3 3.5 - 4 On Buspar 1  How bad is depression A little 0 0.5 0 0 '3 1 1  '$ Any chronic pain - if so where and how bad x  Back  Back  Back  Back and neuopathy Back pain - oxycodone '5mg'$  /  following with pain management  Controleld back pain on med   Simple office walk 185 feet x  3 laps goal with forehead probe 08/12/2021  09/28/2021  12/30/2021  06/20/2022   O2 used ra ra RA RA  Number laps completed '1  3 3 '$ laps   Comments about pace steady  Steady Steady   Resting Pulse Ox/HR 95% and 89/min 99% and HR 90 97%; HR 97  100%; HR 77  Final Pulse Ox/HR 86% and 122/min 94% and HR 114 98%; HR 120 100%; 76%  Desaturated </= 88% no no No No  Desaturated <= 3% points yes  Yes, 5 ponts No No  Got Tachycardic >/= 90/min yes yes Yes No  Symptoms at end of test dyspnea Mild does None None  Miscellaneous comments x improve x x     PFT     Latest Ref Rng & Units 08/02/2022   12:08 PM 03/24/2022    9:14 AM 12/30/2021   10:56 AM 12/08/2020   12:01 PM  PFT Results  FVC-Pre L 3.12  P 2.88  3.27  2.97   FVC-Predicted Pre % 75  P 69  79  71   FVC-Post L    2.96   FVC-Predicted Post %    71   Pre FEV1/FVC % % 78  P 78  76  66   Post FEV1/FCV % %    70   FEV1-Pre L 2.43  P 2.23  2.49  1.97   FEV1-Predicted Pre % 82  P 75  84  65   FEV1-Post L    2.07   DLCO uncorrected ml/min/mmHg 14.29  P 12.95  15.89  18.01   DLCO UNC% % 57  P 52  64  72   DLCO corrected ml/min/mmHg 14.29  P 14.05  17.38  20.52   DLCO COR %Predicted % 57  P 56  70  82   DLVA Predicted % 87  P 84  91  120   TLC L    5.04   TLC % Predicted %    71   RV % Predicted %    95     P Preliminary result   CLINICAL DATA:  77 year old male history of pulmonary fibrosis. Follow-up study.   EXAM: CT CHEST WITHOUT CONTRAST -personally visualized the CT scan of the chest and also independently made an assessment and agree with the official findings.   TECHNIQUE: Multidetector CT imaging of the chest was performed following the standard protocol without intravenous contrast. High resolution imaging of the lungs, as well as inspiratory and expiratory imaging, was performed.   RADIATION DOSE REDUCTION: This exam was  performed according to the departmental dose-optimization program which includes automated exposure control, adjustment of the mA and/or kV according to patient size and/or use of iterative reconstruction technique.   COMPARISON:  High-resolution chest CT 12/27/2021.   FINDINGS: Cardiovascular: Heart size is normal. There is no significant pericardial fluid, thickening or pericardial calcification. There is aortic atherosclerosis, as well as atherosclerosis of the great vessels of the mediastinum and the coronary arteries, including calcified atherosclerotic plaque in the left main, left anterior descending, left circumflex and right coronary arteries. Mild calcifications of the aortic valve.   Mediastinum/Nodes: No pathologically enlarged mediastinal or hilar lymph nodes. Please note that accurate exclusion of hilar adenopathy is limited on noncontrast CT scans. Esophagus is unremarkable in appearance.   Lungs/Pleura: High-resolution images again demonstrate patchy areas of mild ground-glass attenuation, septal thickening, subpleural reticulation, thickening of the peribronchovascular interstitium, cylindrical bronchiectasis and peripheral bronchiolectasis, with some areas of developing honeycombing (most evident in the left lower lobe), stable compared to the prior examination. Inspiratory and expiratory imaging demonstrates severe collapse of the trachea and mainstem bronchi during expiration, indicative of tracheobronchomalacia. Mild centrilobular and paraseptal emphysema also noted. Small 5 x 4 mm left lower lobe pulmonary nodule (axial image 74 of series 9), and 4 mm right lower lobe pulmonary nodule (axial image 74 of series 9) both stable on numerous prior examinations, considered benign. No other suspicious appearing pulmonary nodules or masses are noted.   Upper Abdomen:  Unremarkable.   Musculoskeletal: Orthopedic rod and screw fixation hardware in the visualized  thoracolumbar spine again noted. Multilevel degenerative disc disease. There are no aggressive appearing lytic or blastic lesions noted in the visualized portions of the skeleton.   IMPRESSION: 1. The appearance of the chest is compatible with interstitial lung disease, once again categorized as probable usual interstitial pneumonia (UIP) per current ATS guidelines, with stable findings compared to the prior study, as above. 2. There is also mild centrilobular and paraseptal emphysema; imaging findings suggestive of underlying COPD. 3. Aortic atherosclerosis, in addition to left main and three-vessel coronary artery disease. Assessment for potential risk factor modification, dietary therapy or pharmacologic therapy may be warranted, if clinically indicated. 4. Additional incidental findings, as above.   Aortic Atherosclerosis (ICD10-I70.0) and Emphysema (ICD10-J43.9).     Electronically Signed   By: Vinnie Langton M.D.   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   reports that he quit smoking about 33 years ago. His smoking use included cigarettes. He has a 20.00 pack-year smoking history. He has never used smokeless tobacco.  Past Surgical History:  Procedure Laterality Date    ATRIAL FIBRILLATION ABLATION N/A 01/27/2022   Procedure: ATRIAL FIBRILLATION ABLATION;  Surgeon: Constance Haw, MD;  Location: [facility];  Service: Cardiovascular;  Laterality: N/A;   BACK SURGERY  2012 and 1994   Dr Margret Chance, screws and cage in low back   BIOPSY  01/09/2020   Procedure: BIOPSY;  Surgeon: Otis Brace, MD;  Location: [facility];  Service: Gastroenterology;;   BRONCHIAL BIOPSY  11/17/2020   Procedure: BRONCHIAL BIOPSIES;  Surgeon: Laurin Coder, MD;  Location: [facility];  Service: Endoscopy;;   BRONCHIAL BRUSHINGS  11/17/2020   Procedure: BRONCHIAL BRUSHINGS;  Surgeon: Laurin Coder, MD;  Location: [facility];  Service: Endoscopy;;   BRONCHIAL WASHINGS  11/17/2020   Procedure: BRONCHIAL WASHINGS;  Surgeon: Laurin Coder, MD;  Location: [facility];  Service: Endoscopy;;   COLONOSCOPY WITH PROPOFOL N/A 01/09/2020   Procedure: COLONOSCOPY WITH PROPOFOL;  Surgeon: Otis Brace, MD;  Location: [facility];  Service: Gastroenterology;  Laterality: N/A;   ESOPHAGOGASTRODUODENOSCOPY (EGD) WITH PROPOFOL N/A 01/09/2020   Procedure: ESOPHAGOGASTRODUODENOSCOPY (EGD) WITH PROPOFOL;  Surgeon: Otis Brace, MD;  Location: [facility];  Service: Gastroenterology;  Laterality: N/A;   EYE SURGERY Bilateral    2016   HARDWARE REVISION  03/12/2019   REVISION OF HARDWARE THORACIC TEN-THORACIC ELEVEN WITH APPLICATION OF ADDITIONAL RODS AND ROD SLEEVES THORACIC EIGHT-THORACIC TWELVE 12-LUMBAR ONE (N/A )   HEMOSTASIS CONTROL  11/17/2020   Procedure: HEMOSTASIS CONTROL;  Surgeon: Laurin Coder, MD;  Location: [facility];  Service: Endoscopy;;   IR US GUIDE BX ASP/DRAIN  11/13/2020   LAMINECTOMY  02/12/2019   DECOMPRESSIVE LAMINECTOMY THORACIC NINE-THORACIC TEN AND THORACIC TEN-THORACIC ELEVEN, EXTENSION OF THORACOLUMBAR FUSION FROM THORACIC TEN TO THORACIC FIVE, LOCAL BONE GRAFT, ALLOGRAFT AND VIVIGEN (N/A)   POLYPECTOMY  01/09/2020   Procedure:  POLYPECTOMY;  Surgeon: Otis Brace, MD;  Location: [facility];  Service: Gastroenterology;;   REMOVAL OF RODS AND PEDICLE SCREWS T5 AND T6, EXPLORATION OF FUSION, BIOPSY TRANSPEDICULAR T5 AND T6 (N/A )  09/14/2020   SPINAL FUSION N/A 02/12/2019   Procedure: DECOMPRESSIVE LAMINECTOMY THORACIC NINE-THORACIC TEN AND THORACIC TEN-THORACIC ELEVEN, EXTENSION OF THORACOLUMBAR FUSION FROM THORACIC TEN TO THORACIC FIVE, LOCAL BONE GRAFT, ALLOGRAFT AND VIVIGEN;  Surgeon: Jessy Oto, MD;  Location: [facility];  Service: Orthopedics;  Laterality: N/A;  DECOMPRESSIVE LAMINECTOMY THORACIC NINE-THORACIC TEN AND THORACIC TEN-THORACIC ELEVEN, EXTENSION OF TH   SPINAL FUSION N/A 03/12/2019   Procedure: REVISION OF HARDWARE THORACIC TEN-THORACIC ELEVEN WITH APPLICATION OF ADDITIONAL RODS AND ROD SLEEVES THORACIC EIGHT-THORACIC TWELVE 12-LUMBAR ONE;  Surgeon: Jessy Oto, MD;  Location: [facility];  Service: Orthopedics;  Laterality: N/A;   TONSILLECTOMY     as child   torn rotator cuff  2010   right   VIDEO BRONCHOSCOPY N/A 11/17/2020   Procedure: VIDEO BRONCHOSCOPY WITH FLUORO;  Surgeon: Laurin Coder, MD;  Location: [facility];  Service: Endoscopy;  Laterality: N/A;    Allergies  Allergen Reactions   Daptomycin     Drug induced pneumonia    Diltiazem Other (See Comments)    Passed out    Immunization History  Administered Date(s) Administered   COVID-19, mRNA, vaccine(Comirnaty)12 years and older 05/23/2022   Fluad Quad(high Dose 65+) 04/05/2019, 06/29/2020, 05/18/2021, 04/22/2022   Influenza Split 04/23/2012   Influenza Whole 02/16/2011   Influenza, High Dose Seasonal PF 05/06/2013, 03/25/2016, 05/05/2017, 06/07/2018   PFIZER(Purple Top)SARS-COV-2 Vaccination 08/23/2019, 09/13/2019, 06/07/2020   Pfizer Covid-19 Vaccine Bivalent Booster 82yr & up 04/29/2021   Pneumococcal Conjugate-13 03/25/2016   Pneumococcal Polysaccharide-23 01/20/2009, 10/19/2011   Respiratory Syncytial Virus  Vaccine,Recomb Aduvanted(Arexvy) 04/09/2022   Td 07/09/2003, 07/18/2008   Tdap 07/20/2011   Zoster, Live 04/27/2016    Family History  Problem Relation Age of Onset   Hypertension Mother    Diabetes Mother        type 2   Cancer Mother 51      breast in remission   Emphysema Father        smoker   COPD Father        smoker   Stroke Father 746      mini   Heart disease Father    Hypertension Sister    Hyperlipidemia Sister    Scoliosis Sister    Osteoporosis Sister    Hypertension Maternal Grandmother    Scoliosis Maternal Grandmother    Heart disease Maternal Grandfather    Heart disease Paternal Grandfather        smoker   Heart disease Daughter      Current Outpatient Medications:    albuterol (PROVENTIL HFA) 108 (90 Base) MCG/ACT inhaler, TAKE 2 PUFFS BY MOUTH EVERY 6 HOURS AS NEEDED FOR WHEEZE OR SHORTNESS OF BREATH, Disp: 8 g, Rfl: 5   fluticasone (FLONASE) 50 MCG/ACT nasal spray, SPRAY 2 SPRAYS INTO EACH NOSTRIL EVERY DAY, Disp: 48 mL, Rfl: 1   Fluticasone-Umeclidin-Vilant (TRELEGY ELLIPTA) 200-62.5-25 MCG/ACT AEPB, Inhale 1 puff into the lungs daily., Disp: 60 each, Rfl: 5   predniSONE (DELTASONE) 10 MG tablet, TAKE 1 TABLET (10 MG TOTAL) BY MOUTH DAILY WITH BREAKFAST., Disp: 30 tablet, Rfl: 3   atorvastatin (LIPITOR) 10 MG tablet, TAKE 1 TABLET BY MOUTH EVERY DAY, Disp: 90 tablet, Rfl: 1   B-D UF III MINI PEN NEEDLES 31G X 5 MM MISC, USE WITH LANTUS, Disp: 100 each,  Rfl: 0   Baclofen 5 MG TABS, Take 1 tablet by mouth 2 (two) times daily., Disp: 60 tablet, Rfl: 1   busPIRone (BUSPAR) 15 MG tablet, Take 1 tablet (15 mg total) by mouth 2 (two) times daily., Disp: 180 tablet, Rfl: 0   colchicine 0.6 MG tablet, Take 1 tablet (0.6 mg total) by mouth 2 (two) times daily for 14 days., Disp: 28 tablet, Rfl: 0   dextromethorphan-guaiFENesin (MUCINEX DM) 30-600 MG 12hr tablet, Take 1 tablet by mouth 2 (two) times daily., Disp: , Rfl:    famciclovir (FAMVIR) 500 MG tablet,  TAKE 1 TABLET BY MOUTH THREE TIMES A DAY, Disp: 21 tablet, Rfl: 0   glucose blood (CONTOUR NEXT TEST) test strip, Check blood sugar once daily, Disp: 100 each, Rfl: 12   insulin glargine (LANTUS) 100 UNIT/ML Solostar Pen, Inject 10 Units into the skin daily., Disp: 15 mL, Rfl: 0   JANUVIA 50 MG tablet, TAKE 1 TABLET BY MOUTH EVERY DAY, Disp: 30 tablet, Rfl: 3   metFORMIN (GLUCOPHAGE) 1000 MG tablet, TAKE 1 TABLET (1,000 MG TOTAL) BY MOUTH TWICE A DAY WITH FOOD, Disp: 180 tablet, Rfl: 0   Nintedanib (OFEV) 150 MG CAPS, Take 1 capsule (150 mg total) by mouth 2 (two) times daily., Disp: 60 capsule, Rfl: 5   oxyCODONE-acetaminophen (PERCOCET/ROXICET) 5-325 MG tablet, Take 1 tablet by mouth every 4 (four) hours as needed for moderate pain., Disp: , Rfl:    oxyCODONE-acetaminophen (PERCOCET/ROXICET) 5-325 MG tablet, Take 1 tablet by mouth every 6 (six) hours as needed for pain (frequency change), Disp: 120 tablet, Rfl: 0   oxyCODONE-acetaminophen (PERCOCET/ROXICET) 5-325 MG tablet, Take 1 tablet by mouth every 4 (four) hours as needed for pain., Disp: 180 tablet, Rfl: 0   pantoprazole (PROTONIX) 40 MG tablet, TAKE 1 TABLET BY MOUTH TWICE A DAY BEFORE A MEAL, Disp: 180 tablet, Rfl: 1   pregabalin (LYRICA) 75 MG capsule, TAKE 1 CAPSULE BY MOUTH TWICE A DAY (Patient taking differently: Take 75 mg by mouth 2 (two) times daily.), Disp: 60 capsule, Rfl: 3   tamsulosin (FLOMAX) 0.4 MG CAPS capsule, TAKE 2 CAPSULES BY MOUTH EVERY DAY, Disp: 180 capsule, Rfl: 3   tiZANidine (ZANAFLEX) 4 MG tablet, TAKE 1 TABLET BY MOUTH EVERY 6 HOURS AS NEEDED, Disp: 360 tablet, Rfl: 1   venlafaxine XR (EFFEXOR-XR) 150 MG 24 hr capsule, TAKE 1 CAPSULE BY MOUTH DAILY WITH BREAKFAST., Disp: 90 capsule, Rfl: 0   warfarin (COUMADIN) 5 MG tablet, TAKE 1 TO 2 TABLETS DAILY OR AS PRESCRIBED BY COUMADIN CLINIC, Disp: 130 tablet, Rfl: 1   zolpidem (AMBIEN) 10 MG tablet, Take 0.5-1 tablets (5-10 mg total) by mouth at bedtime as needed for  sleep., Disp: 15 tablet, Rfl: 1  Current Facility-Administered Medications:    testosterone cypionate (DEPOTESTOSTERONE CYPIONATE) injection 200 mg, 200 mg, Intramuscular, Q14 Days, Saguier, Edward, PA-C, 200 mg at 07/06/21 0953      Objective:   Vitals:   08/02/22 1305  BP: 130/70  Pulse: 78  Temp: 98 F (36.7 C)  SpO2: 98%  Weight: 183 lb (83 kg)  Height: '5\' 10"'$  (1.778 m)    Estimated body mass index is 26.26 kg/m as calculated from the following:   Height as of this encounter: '5\' 10"'$  (1.778 m).   Weight as of this encounter: 183 lb (83 kg).  '@WEIGHTCHANGE'$ @  Filed Weights   08/02/22 1305  Weight: 183 lb (83 kg)     Physical Exam    General:  No distress. Looks well. LONG scar in back Neuro: Alert and Oriented x 3. GCS 15. Speech normal Psych: Pleasant Resp:  Barrel Chest - no.  Wheeze - no, Crackles - yes crackles at base, No overt respiratory distress CVS: Normal heart sounds. Murmurs - no Ext: Stigmata of Connective Tissue Disease - no HEENT: Normal upper airway. PEERL +. No post nasal drip        Assessment:       ICD-10-CM   1. UIP (usual interstitial pneumonitis) ([place])  J84.112     2. IPF (idiopathic pulmonary fibrosis) ([place])  J84.112     3. Pulmonary emphysema, unspecified emphysema type ([place])  J43.9     4. History of pulmonary embolism  Z86.711     5. Other fatigue  R53.83          Plan:     Patient Instructions  Acute on chronic respiratory failure with hypoxia [facility]) - Spring 2022 & Dec 2022/Jan 2023 Interstitial pulmonary disease ([place]) - UIP progressive History of smoking 20 ppd - possible emphsyema on CT  Slolwy progressive pumonary fibrosis even on PFT Sept 2023 -> stabilized since starting ofev oct 2023 Esbriet intolerant Now on ofev x since late oct 2023 -   = tolerating it well but for fatigue ad co pay issues   Plan - cotontinue stiolto as before - Continue oxygen with at night and exertion > 600 feet (200 yards) -  Continue prednisone  '5mg'$  per day   - look at tapering next visit -- cotninue ofev ( - Check LFT 08/02/2022 and monthly next 3 month  - sendind message to pharmacy re cost - refer pulmonry rehab - Do spirometry and dlco in 12 weeks -= 16 weeks   Fatigue due to nintedanib  Plan - Continue nintedanib for now [shared decision making] - Refefer pulmonary rehabilitation   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  Plan - Coumadin for A Fib and blood clot per PCP  and cradiology  - life long anticoagulation for clots but leave decision to cardiology and PCP - stop testosterone after d/w PCP [name], MD     Follow-up  - Do spirometry and DLCO in 12 weeks - 16 weeks -Return to see Dr. Chase Caller in 12 - 16  weeks for 30-minute visit  - [simple walking desaturation test and symptom score at follow-up] =        HIGh Complexity   The table below is from the 2021 E/M guidelines, first released in 2021, with minor revisions added in 2023. Must meet the requirements for 2 out of 3 dimensions to qualify.    Number and complexity of problems addressed Amount and/or complexity of data reviewed Risk of complications and/or morbidity  Severe exacerbation of chronic illness  Acute or chronic illnesses that may pose a threat to life or bodily function, e.g., multiple trauma, acute MI, pulmonary embolus, severe respiratory distress, progressive rheumatoid arthritis, psychiatric illness with potential threat to self or others, peritonitis, acute renal failure, abrupt change in neurological status Must meet the requirements for 2 of 3 of the categories)  Category 1: Tests and documents, historian  Any combination of 3 of the following:  Assessment requiring an independent historian  Review of prior external records  Review of results of  each unique test  Ordering of each  unique test    Category 2: Interpretation of tests  Independent interpretation of a test perfromed by another physician/NPP  Category 3: Discuss management/tests  Discussion of magagement or tests with an external physician/NPP Drug therapy requiring intensive monitoring for toxicity  Decision for elective major surgery with identified pateint or procedure risk factors  Decision regarding hospitalization or escalation of level of care  Decision for DNR or to de-escalate care   Parenteral controlled  substances           High complex medical condition requiring high risk prescription with intensive therapeutic monitoring requirement   SIGNATURE    Dr. Brand Males, M.D., F.C.C.P,  Pulmonary and Critical Care Medicine Staff Physician, Steward Director - Interstitial Lung Disease  Program  P[facility] [place], [place], [address]  Pager: [phone], If no answer or between  15:00h - 7:00h: call [phone] Telephone: [phone]  1:30 PM 08/02/2022

## 2022-08-02 NOTE — Patient Instructions (Addendum)
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 Sept 2023 -> stabilized since starting ofev oct 2023 Esbriet intolerant Now on ofev x since late oct 2023 -   = tolerating it well but for fatigue ad co pay issues   Plan - cotontinue stiolto as before - Continue oxygen with at night and exertion > 600 feet (200 yards) - Continue prednisone  '5mg'$  per day   - look at tapering next visit -- cotninue ofev ( - Check LFT 08/02/2022 and monthly next 3 month  - sendind message to pharmacy re cost - refer pulmonry rehab - Do spirometry and dlco in 12 weeks -= 16 weeks   Fatigue due to nintedanib  Plan - Continue nintedanib for now [shared decision making] - Refefer pulmonary rehabilitation   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  Plan - Coumadin for A Fib and blood clot per PCP  and [facility]  - life long anticoagulation for clots but leave decision to cardiology and PCP - stop testosterone after d/w PCP [name], MD     Follow-up  - Do spirometry and DLCO in 12 weeks - 16 weeks -Return to see Dr. Chase Caller in 12 - 16  weeks for 30-minute visit  - [simple walking desaturation test and symptom score at follow-up] =

## 2022-08-02 NOTE — Progress Notes (Signed)
Spirometry and Dlco done today. 

## 2022-08-02 NOTE — Addendum Note (Signed)
Addended by: Alvin Critchley on: 08/02/2022 02:12 PM   Modules accepted: Orders

## 2022-08-03 ENCOUNTER — Ambulatory Visit: Payer: Medicare Other | Attending: Cardiovascular Disease

## 2022-08-03 DIAGNOSIS — I480 Paroxysmal atrial fibrillation: Secondary | ICD-10-CM | POA: Diagnosis not present

## 2022-08-03 LAB — POCT INR: INR: 4.1 — AB (ref 2.0–3.0)

## 2022-08-03 NOTE — Patient Instructions (Signed)
Description   Eat a serving of greens. Do not take any warfarin tomorrow then START taking Warfarin 1.5 tablets daily except for 1 tablet on Mondays, Wednesdays, and Fridays.  Stay consistent with greens each week.  Recheck INR in 2 weeks.  Coumadin Clinic# [phone]

## 2022-08-03 NOTE — Telephone Encounter (Signed)
Appears patient was over the income guidelines for Ofev in Sept. Currently no grants open (also based on income)

## 2022-08-08 NOTE — Telephone Encounter (Signed)
[employer] has had decrease in financial income threshold for 2024 due to changes in Medicare Part D. Max OOP for Part D medication is supposed to be $3333 (not including deductible) and Ofev's income threshold for household of  two is now [phone].  Last year we had discussed submitting PAP in 2024 since he'd have updated income documents reflecting the income that they have now that his wife is retired.  ATC patient. Left VM requesting return call. Will f/u  Knox Saliva, PharmD, MPH, BCPS, CPP Clinical Pharmacist (Rheumatology and Pulmonology)

## 2022-08-10 ENCOUNTER — Encounter: Payer: Self-pay | Admitting: Family Medicine

## 2022-08-11 ENCOUNTER — Other Ambulatory Visit: Payer: Self-pay | Admitting: Family Medicine

## 2022-08-11 ENCOUNTER — Other Ambulatory Visit (HOSPITAL_COMMUNITY): Payer: Self-pay

## 2022-08-11 ENCOUNTER — Other Ambulatory Visit: Payer: Self-pay | Admitting: Medical

## 2022-08-11 DIAGNOSIS — M961 Postlaminectomy syndrome, not elsewhere classified: Secondary | ICD-10-CM | POA: Diagnosis not present

## 2022-08-11 DIAGNOSIS — M4624 Osteomyelitis of vertebra, thoracic region: Secondary | ICD-10-CM | POA: Diagnosis not present

## 2022-08-11 DIAGNOSIS — M150 Primary generalized (osteo)arthritis: Secondary | ICD-10-CM | POA: Diagnosis not present

## 2022-08-11 DIAGNOSIS — G894 Chronic pain syndrome: Secondary | ICD-10-CM | POA: Diagnosis not present

## 2022-08-11 MED ORDER — ZOLPIDEM TARTRATE 10 MG PO TABS: 5.0000 mg | ORAL_TABLET | Freq: Every evening | ORAL | 1 refills | Status: DC | PRN

## 2022-08-11 MED ORDER — OXYCODONE-ACETAMINOPHEN 5-325 MG PO TABS: 1.0000 | ORAL_TABLET | ORAL | 0 refills | Status: DC | PRN

## 2022-08-11 MED ORDER — TRIAMCINOLONE ACETONIDE 0.1 % EX CREA
TOPICAL_CREAM | CUTANEOUS | 1 refills | Status: DC
  Filled 2022-08-11: qty 30, 15d supply, fill #0

## 2022-08-11 MED ORDER — TIZANIDINE HCL 4 MG PO TABS
6.0000 mg | ORAL_TABLET | ORAL | 2 refills | Status: DC
  Filled 2022-08-11: qty 180, 20d supply, fill #0

## 2022-08-11 MED ORDER — OXYCODONE-ACETAMINOPHEN 5-325 MG PO TABS
1.0000 | ORAL_TABLET | ORAL | 0 refills | Status: DC | PRN
  Filled 2022-08-17: qty 180, 30d supply, fill #0

## 2022-08-12 ENCOUNTER — Other Ambulatory Visit (HOSPITAL_COMMUNITY): Payer: Self-pay

## 2022-08-15 ENCOUNTER — Encounter: Payer: Self-pay | Admitting: Internal Medicine

## 2022-08-15 NOTE — Telephone Encounter (Signed)
The ofev cost is worrisome with [employer] doing this. This is NOT acceptable at all but very hard to change the payors decision. Clinical trial should NOT be a substitute for main Rx. Clinical trial is volunatry and for reasons to build a new drug. It is NOT a substritue for failed insurance coverage. 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

## 2022-08-15 NOTE — Telephone Encounter (Signed)
Returned call to patient regarding Ofev. He does not meet Ofev PAP criteria for 2024. I advised that [employer] has changed their income criteria due to changes to maximum out-of-pocket for Medicare Part D enrollees.  He will call his insurance to determine if they can quote him a price for the month but advised that they are unlikely to. He has already previously been unable to tolerate Esbriet. He states that Dr. Chase Caller discussed another option (through clinical study for deuterated pirfenidone?). I did review that he has tried all FDA-approved options for IPF. Nevertheless, once he talks to insurance, he will reconnect with our clinic about next steps.  Knox Saliva, PharmD, MPH, BCPS, CPP Clinical Pharmacist (Rheumatology and Pulmonology)

## 2022-08-16 ENCOUNTER — Other Ambulatory Visit: Payer: Self-pay | Admitting: Medical

## 2022-08-17 ENCOUNTER — Other Ambulatory Visit (HOSPITAL_COMMUNITY): Payer: Self-pay

## 2022-08-17 ENCOUNTER — Ambulatory Visit: Payer: Medicare Other | Attending: Cardiology | Admitting: *Deleted

## 2022-08-17 DIAGNOSIS — Z7901 Long term (current) use of anticoagulants: Secondary | ICD-10-CM

## 2022-08-17 DIAGNOSIS — I4891 Unspecified atrial fibrillation: Secondary | ICD-10-CM

## 2022-08-17 DIAGNOSIS — I480 Paroxysmal atrial fibrillation: Secondary | ICD-10-CM | POA: Diagnosis not present

## 2022-08-17 LAB — POCT INR: INR: 4.1 — AB (ref 2.0–3.0)

## 2022-08-17 NOTE — Patient Instructions (Signed)
Description   Do not take any warfarin tomorrow then START taking warfarin 1 tablet daily except 1.5 tablets on Tuesdays, Thursdays, and Saturdays.  Stay consistent with greens each week. Recheck INR in 2 weeks.  Coumadin Clinic# [phone]

## 2022-08-18 ENCOUNTER — Encounter: Payer: Self-pay | Admitting: Internal Medicine

## 2022-08-18 NOTE — Telephone Encounter (Signed)
Mychart message received from pt in regards to the OFEV:  Ivor Costa "Ike"  P [facility] (supporting Brand Males, MD)8 minutes ago (3:25 PM)    Talked to insurance company today and I'm now in the catastrophic phase. He said there wouldn't be a co-pay but your office would need to call for prior authorization before the next refill. I have about 2 1/2 weeks left. Should I contact you a few days before? Thanks, [employer] to pharmacy team. Please advise.

## 2022-08-19 ENCOUNTER — Other Ambulatory Visit (HOSPITAL_COMMUNITY): Payer: Self-pay

## 2022-08-19 MED ORDER — OFEV 150 MG PO CAPS: 150.0000 mg | ORAL_CAPSULE | Freq: Two times a day (BID) | ORAL | 1 refills | Status: DC

## 2022-08-19 NOTE — Telephone Encounter (Signed)
Patient sent MyChart message and is now n catastrophic. Ran test claim and coapy for Ofev is $0 now. Rx sent to [facility]  LFTs on 08/02/2022 wnl  Knox Saliva, PharmD, MPH, BCPS, CPP Clinical Pharmacist (Rheumatology and Pulmonology)

## 2022-08-19 NOTE — Telephone Encounter (Signed)
Rx has been sent for Ofev as patient is now in catastrophic. No copay per test claim.  Knox Saliva, PharmD, MPH, BCPS, CPP Clinical Pharmacist (Rheumatology and Pulmonology)

## 2022-08-22 ENCOUNTER — Encounter: Payer: Self-pay | Admitting: Family Medicine

## 2022-08-23 ENCOUNTER — Encounter: Payer: Self-pay | Admitting: Internal Medicine

## 2022-08-23 ENCOUNTER — Ambulatory Visit (INDEPENDENT_AMBULATORY_CARE_PROVIDER_SITE_OTHER): Payer: Medicare Other | Admitting: Family

## 2022-08-23 ENCOUNTER — Encounter: Payer: Self-pay | Admitting: Family

## 2022-08-23 ENCOUNTER — Encounter: Payer: Self-pay | Admitting: Family Medicine

## 2022-08-23 VITALS — BP 87/58 | HR 64 | Temp 98.7°F | Resp 16 | Wt 182.0 lb

## 2022-08-23 DIAGNOSIS — R350 Frequency of micturition: Secondary | ICD-10-CM

## 2022-08-23 DIAGNOSIS — K219 Gastro-esophageal reflux disease without esophagitis: Secondary | ICD-10-CM

## 2022-08-23 DIAGNOSIS — E0800 Diabetes mellitus due to underlying condition with hyperosmolarity without nonketotic hyperglycemic-hyperosmolar coma (NKHHC): Secondary | ICD-10-CM | POA: Diagnosis not present

## 2022-08-23 DIAGNOSIS — E291 Testicular hypofunction: Secondary | ICD-10-CM

## 2022-08-23 DIAGNOSIS — I10 Essential (primary) hypertension: Secondary | ICD-10-CM

## 2022-08-23 DIAGNOSIS — J439 Emphysema, unspecified: Secondary | ICD-10-CM

## 2022-08-23 DIAGNOSIS — R5383 Other fatigue: Secondary | ICD-10-CM | POA: Diagnosis not present

## 2022-08-23 DIAGNOSIS — I4891 Unspecified atrial fibrillation: Secondary | ICD-10-CM | POA: Diagnosis not present

## 2022-08-23 DIAGNOSIS — Z794 Long term (current) use of insulin: Secondary | ICD-10-CM | POA: Diagnosis not present

## 2022-08-23 LAB — URINALYSIS, ROUTINE W REFLEX MICROSCOPIC
Bilirubin Urine: NEGATIVE
Ketones, ur: NEGATIVE
Leukocytes,Ua: NEGATIVE
Nitrite: NEGATIVE
Specific Gravity, Urine: 1.01 (ref 1.000–1.030)
Total Protein, Urine: NEGATIVE
Urine Glucose: NEGATIVE
Urobilinogen, UA: 0.2 (ref 0.0–1.0)
pH: 7 (ref 5.0–8.0)

## 2022-08-23 LAB — MICROALBUMIN / CREATININE URINE RATIO
Creatinine,U: 132 mg/dL
Microalb Creat Ratio: 1.9 mg/g (ref 0.0–30.0)
Microalb, Ur: 2.5 mg/dL — ABNORMAL HIGH (ref 0.0–1.9)

## 2022-08-23 LAB — CBC WITH DIFFERENTIAL/PLATELET
Basophils Absolute: 0 10*3/uL (ref 0.0–0.1)
Basophils Relative: 0.4 % (ref 0.0–3.0)
Eosinophils Absolute: 0.2 10*3/uL (ref 0.0–0.7)
Eosinophils Relative: 2.1 % (ref 0.0–5.0)
HCT: 36.4 % — ABNORMAL LOW (ref 39.0–52.0)
Hemoglobin: 12.3 g/dL — ABNORMAL LOW (ref 13.0–17.0)
Lymphocytes Relative: 2.1 % — ABNORMAL LOW (ref 12.0–46.0)
Lymphs Abs: 0.2 10*3/uL — ABNORMAL LOW (ref 0.7–4.0)
MCHC: 33.8 g/dL (ref 30.0–36.0)
MCV: 88.1 fl (ref 78.0–100.0)
Monocytes Absolute: 1 10*3/uL (ref 0.1–1.0)
Monocytes Relative: 12.9 % — ABNORMAL HIGH (ref 3.0–12.0)
Neutro Abs: 6.1 10*3/uL (ref 1.4–7.7)
Neutrophils Relative %: 82.5 % — ABNORMAL HIGH (ref 43.0–77.0)
Platelets: 363 10*3/uL (ref 150.0–400.0)
RBC: 4.13 Mil/uL — ABNORMAL LOW (ref 4.22–5.81)
RDW: 15.5 % (ref 11.5–15.5)
WBC: 7.4 10*3/uL (ref 4.0–10.5)

## 2022-08-23 LAB — COMPREHENSIVE METABOLIC PANEL
ALT: 18 U/L (ref 0–53)
AST: 27 U/L (ref 0–37)
Albumin: 3.8 g/dL (ref 3.5–5.2)
Alkaline Phosphatase: 102 U/L (ref 39–117)
BUN: 11 mg/dL (ref 6–23)
CO2: 25 mEq/L (ref 19–32)
Calcium: 8.3 mg/dL — ABNORMAL LOW (ref 8.4–10.5)
Chloride: 92 mEq/L — ABNORMAL LOW (ref 96–112)
Creatinine, Ser: 0.77 mg/dL (ref 0.40–1.50)
GFR: 85.46 mL/min (ref >60.00)
Glucose, Bld: 318 mg/dL — ABNORMAL HIGH (ref 70–99)
Potassium: 4.5 mEq/L (ref 3.5–5.1)
Sodium: 129 mEq/L — ABNORMAL LOW (ref 135–145)
Total Bilirubin: 0.5 mg/dL (ref 0.2–1.2)
Total Protein: 5.5 g/dL — ABNORMAL LOW (ref 6.0–8.3)

## 2022-08-23 LAB — TSH: TSH: 3.2 u[IU]/mL (ref 0.35–5.50)

## 2022-08-23 LAB — C-REACTIVE PROTEIN: CRP: 5.6 mg/dL (ref 0.5–20.0)

## 2022-08-23 LAB — HEMOGLOBIN A1C: Hgb A1c MFr Bld: 10.4 % — ABNORMAL HIGH (ref 4.6–6.5)

## 2022-08-23 LAB — SEDIMENTATION RATE: Sed Rate: 15 mm/hr (ref 0–20)

## 2022-08-23 NOTE — Addendum Note (Signed)
Addended by: Manuela Schwartz on: 08/23/2022 11:28 AM   Modules accepted: Orders

## 2022-08-23 NOTE — Progress Notes (Signed)
Labs were interfaced to me, just wanted to make sure you saw them

## 2022-08-23 NOTE — Assessment & Plan Note (Signed)
Reports overall breathing has been stable.

## 2022-08-23 NOTE — Assessment & Plan Note (Signed)
Maintained on pantoprazole daily.  Reports previous hx of ulcers.  Denies any current gerd symptoms.

## 2022-08-23 NOTE — Assessment & Plan Note (Addendum)
Lab Results  Component Value Date   HGBA1C 9.0 (H) 03/15/2022   HGBA1C 10.2 (H) 08/16/2021   HGBA1C 7.4 (H) 07/15/2021   Lab Results  Component Value Date   MICROALBUR 0.61 04/23/2012   LDLCALC 70 03/15/2022   CREATININE 0.75 06/16/2022   Maintained on lantus 12 units of lantus as well as metformin and januvia. Will obtain follow up A1C.

## 2022-08-23 NOTE — Assessment & Plan Note (Signed)
New.  He has hx of osteomyelitis of the thoracic spine which was treated with prolonged course of antibiotics back in 2022 and followed by ID.  Pt and wife are requesting CRP/ESR to be rechecked.    Also, will check labs as below.  He is on prednisone so his WBC may be elevated.

## 2022-08-23 NOTE — Assessment & Plan Note (Signed)
Feeling OK without Testosterone replacement.

## 2022-08-23 NOTE — Assessment & Plan Note (Addendum)
Initial reading upon arrival was 87/58.  Repeat reading 155/111 and 170/11.  Will not make any medication adjustments at this time, but patient an wife plan to keep a close eye on his bp at home and they will keep upcoming appointment with PCP.

## 2022-08-23 NOTE — Assessment & Plan Note (Signed)
Maintained on Warfarin. Followed by coumadin clinic.

## 2022-08-23 NOTE — Progress Notes (Signed)
Subjective:   By signing my name below, I, [name], attest that this documentation has been prepared under the direction and in the presence of Debbrah Alar, NP. 08/23/2022.   [name], male    DOB: 1945-07-30, 77 y.o.   MRN: [id number]  Chief Complaint  Patient presents with   Diabetes    Patient reports elevated glucose readings at home   Hypertension    Here for follow up    HPI Patient is in today for an office visit. He is accompanied by a family member.  Chills/Fatigue: He complains of more frequent chills, which he initially thought was a side effect of Ofev for pulmonary fibrosis. In the past 3 months he has struggled with fatigue as the main side effect of Ofev. His fatigue is worse in the mornings, and has been limiting his formal exercise sometimes.   Back Pain: Additionally he complains of "a lot more" musculoskeletal back pain and general back discomfort. He endorses a prior back infection secondary to multiple surgeries. At this time he denies any fever.  Blood pressure: In clinic today his blood pressure is 87/58, which he notes is very low for him. Using the machine cuff, his blood pressure is 135/115. He states that he did not have his usual breakfast this morning.  BP Readings from Last 3 Encounters:  08/23/22 (!) 87/58  08/02/22 130/70  07/15/22 103/63   Acid Reflux: No recent issues with acid reflux. He is compliant with daily pantoprazole. Previously had ulcers. Typically stays away from spicy foods, doesn't usually drink alcohol.  Genitourinary: In the past few months he has noticed polyuria more than usual. Blood sugars have been running high at home lately.  History of PE: He reports that his last breathing test showed better results. Previously a PE was found when he was in the hospital with pneumonia. He was not on coumadin at that time. Currently he follows with the [facility] clinic for INR management.  Denies  having any fever, new moles, congestion, sinus pain, sore throat, chest pain, palpitations, cough, SOB, n/v/d, constipation, hematochezia, dysuria, or hematuria, at this time.  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   Esophageal reflux 02/10/2015   Fatigue    HTN (hypertension)    Hyperglycemia    preDM    Hyperlipidemia    Insomnia    Low back pain radiating to both legs 01/16/2017   Measles as a child   Overweight(278.02)    Peripheral neuropathy 08/07/2019   Personal history of colonic polyps 10/27/2012   Follows with [facility]   Pneumonia    " walking"   Preventative health care    Testosterone deficiency 05/23/2012   Wears glasses     Past Surgical History:  Procedure Laterality Date   ATRIAL FIBRILLATION ABLATION N/A 01/27/2022   Procedure: ATRIAL FIBRILLATION ABLATION;  Surgeon: Constance Haw, MD;  Location: [facility];  Service: Cardiovascular;  Laterality: N/A;  BACK SURGERY  2012 and 1994   Dr Margret Chance, screws and cage in low back   BIOPSY  01/09/2020   Procedure: BIOPSY;  Surgeon: Otis Brace, MD;  Location: [facility];  Service: Gastroenterology;;   BRONCHIAL BIOPSY  11/17/2020   Procedure: BRONCHIAL BIOPSIES;  Surgeon: Laurin Coder, MD;  Location: [facility];  Service: Endoscopy;;   BRONCHIAL BRUSHINGS  11/17/2020   Procedure: BRONCHIAL BRUSHINGS;  Surgeon: Laurin Coder, MD;  Location: [facility];  Service: Endoscopy;;   BRONCHIAL WASHINGS  11/17/2020   Procedure: BRONCHIAL WASHINGS;   Surgeon: Laurin Coder, MD;  Location: [facility];  Service: Endoscopy;;   COLONOSCOPY WITH PROPOFOL N/A 01/09/2020   Procedure: COLONOSCOPY WITH PROPOFOL;  Surgeon: Otis Brace, MD;  [facility]: [facility];  Service: Gastroenterology;  Laterality: N/A;   ESOPHAGOGASTRODUODENOSCOPY (EGD) WITH PROPOFOL N/A 01/09/2020   Procedure: ESOPHAGOGASTRODUODENOSCOPY (EGD) WITH PROPOFOL;  Surgeon: Otis Brace, MD;  [facility];  Service: Gastroenterology;  Laterality: N/A;   EYE SURGERY Bilateral    2016   HARDWARE REVISION  03/12/2019   REVISION OF HARDWARE THORACIC TEN-THORACIC ELEVEN WITH APPLICATION OF ADDITIONAL RODS AND ROD SLEEVES THORACIC EIGHT-THORACIC TWELVE 12-LUMBAR ONE (N/A )   HEMOSTASIS CONTROL  11/17/2020   Procedure: HEMOSTASIS CONTROL;  Surgeon: Laurin Coder, MD;  Location: [facility];  Service: Endoscopy;;   IR US GUIDE BX ASP/DRAIN  11/13/2020   LAMINECTOMY  02/12/2019   DECOMPRESSIVE LAMINECTOMY THORACIC NINE-THORACIC TEN AND THORACIC TEN-THORACIC ELEVEN, EXTENSION OF THORACOLUMBAR FUSION FROM THORACIC TEN TO THORACIC FIVE, LOCAL BONE GRAFT, ALLOGRAFT AND VIVIGEN (N/A)   POLYPECTOMY  01/09/2020   Procedure: POLYPECTOMY;  Surgeon: Otis Brace, MD;  Location: [facility];  Service: Gastroenterology;;   REMOVAL OF RODS AND PEDICLE SCREWS T5 AND T6, EXPLORATION OF FUSION, BIOPSY TRANSPEDICULAR T5 AND T6 (N/A )  09/14/2020   SPINAL FUSION N/A 02/12/2019   Procedure: DECOMPRESSIVE LAMINECTOMY THORACIC NINE-THORACIC TEN AND THORACIC TEN-THORACIC ELEVEN, EXTENSION OF THORACOLUMBAR FUSION FROM THORACIC TEN TO THORACIC FIVE, LOCAL BONE GRAFT, ALLOGRAFT AND VIVIGEN;  Surgeon: Jessy Oto, MD;  Location: [place];  Service: Orthopedics;  Laterality: N/A;  DECOMPRESSIVE LAMINECTOMY THORACIC NINE-THORACIC TEN AND THORACIC TEN-THORACIC ELEVEN, EXTENSION OF TH   SPINAL FUSION N/A 03/12/2019   Procedure: REVISION OF HARDWARE THORACIC TEN-THORACIC ELEVEN WITH  APPLICATION OF ADDITIONAL RODS AND ROD SLEEVES THORACIC EIGHT-THORACIC TWELVE 12-LUMBAR ONE;  Surgeon: Jessy Oto, MD;  Location: [place];  Service: Orthopedics;  Laterality: N/A;   TONSILLECTOMY     as child   torn rotator cuff  2010   right   VIDEO BRONCHOSCOPY N/A 11/17/2020   Procedure: VIDEO BRONCHOSCOPY WITH FLUORO;  Surgeon: Laurin Coder, MD;  Location: [facility];  Service: Endoscopy;  Laterality: N/A;    Family History  Problem Relation Age of Onset   Hypertension Mother    Diabetes Mother        type 2   Cancer Mother 64       breast in remission   Emphysema Father        smoker   COPD Father        smoker   Stroke Father 74       mini   Heart disease Father    Hypertension Sister    Hyperlipidemia Sister    Scoliosis Sister    Osteoporosis Sister    Hypertension Maternal Grandmother    Scoliosis Maternal Grandmother    Heart disease Maternal Grandfather    Heart disease  Paternal Grandfather        smoker   Heart disease Daughter     Social History   Socioeconomic History   Marital status: Married    Spouse name: Not on file   Number of children: Not on file   Years of education: Not on file   Highest education level: Not on file  Occupational History   Not on file  Tobacco Use   Smoking status: Former    Packs/day: 1.00    Years: 20.00    Total pack years: 20.00    Types: Cigarettes    Quit date: 07/18/1989    Years since quitting: 33.1   Smokeless tobacco: Never   Tobacco comments:    Former smoker 03/16/22  Vaping Use   Vaping Use: Never used  Substance and Sexual Activity   Alcohol use: Yes    Alcohol/week: 2.0 standard drinks of alcohol    Types: 2 Standard drinks or equivalent per week    Comment: couple beers here and there 03/16/22   Drug use: No   Sexual activity: Yes    Comment: lives with wife, still working, no dietary restrictions, continues to exercise intermittently  Other Topics Concern   Not on file  Social History  Narrative   Not on file   Social Determinants of Health   Financial Resource Strain: Low Risk  (01/27/2021)   Overall Financial Resource Strain (CARDIA)    Difficulty of Paying Living Expenses: Not hard at all  Food Insecurity: No Food Insecurity (01/27/2021)   Hunger Vital Sign    Worried About Running Out of Food in the Last Year: Never true    [place] in the Last Year: Never true  Transportation Needs: No Transportation Needs (01/27/2021)   PRAPARE - Hydrologist (Medical): No    Lack of Transportation (Non-Medical): No  Physical Activity: Sufficiently Active (01/27/2021)   Exercise Vital Sign    Days of Exercise per Week: 7 days    Minutes of Exercise per Session: 30 min  Stress: No Stress Concern Present (01/27/2021)   [place]    Feeling of Stress : Not at all  Social Connections: Moderately Integrated (01/27/2021)   Social Connection and Isolation Panel [NHANES]    Frequency of Communication with Friends and Family: More than three times a week    Frequency of Social Gatherings with Friends and Family: More than three times a week    Attends Religious Services: More than 4 times per year    Active Member of [employer] or Organizations: No    Attends Archivist Meetings: Never    Marital Status: Married  Human resources officer Violence: Not At Risk (01/27/2021)   Humiliation, Afraid, Rape, and Kick questionnaire    Fear of Current or Ex-Partner: No    Emotionally Abused: No    Physically Abused: No    Sexually Abused: No    Outpatient Medications Prior to Visit  Medication Sig Dispense Refill   albuterol (PROVENTIL HFA) 108 (90 Base) MCG/ACT inhaler TAKE 2 PUFFS BY MOUTH EVERY 6 HOURS AS NEEDED FOR WHEEZE OR SHORTNESS OF BREATH 8 g 5   atorvastatin (LIPITOR) 10 MG tablet TAKE 1 TABLET BY MOUTH EVERY DAY 90 tablet 1   B-D UF III MINI PEN NEEDLES 31G X 5 MM MISC USE WITH  LANTUS 100 each 0   Baclofen 5 MG TABS Take 1 tablet by mouth  2 (two) times daily. 60 tablet 1   busPIRone (BUSPAR) 15 MG tablet Take 1 tablet (15 mg total) by mouth 2 (two) times daily. 180 tablet 0   dextromethorphan-guaiFENesin (MUCINEX DM) 30-600 MG 12hr tablet Take 1 tablet by mouth 2 (two) times daily.     famciclovir (FAMVIR) 500 MG tablet TAKE 1 TABLET BY MOUTH THREE TIMES A DAY 21 tablet 0   fluticasone (FLONASE) 50 MCG/ACT nasal spray SPRAY 2 SPRAYS INTO EACH NOSTRIL EVERY DAY 48 mL 1   Fluticasone-Umeclidin-Vilant (TRELEGY ELLIPTA) 200-62.5-25 MCG/ACT AEPB Inhale 1 puff into the lungs daily. 60 each 5   glucose blood (CONTOUR NEXT TEST) test strip Check blood sugar once daily 100 each 12   insulin glargine (LANTUS SOLOSTAR) 100 UNIT/ML Solostar Pen INJECT 10 UNITS INTO THE SKIN DAILY 100 mL 2   JANUVIA 50 MG tablet TAKE 1 TABLET BY MOUTH EVERY DAY 30 tablet 3   metFORMIN (GLUCOPHAGE) 1000 MG tablet TAKE 1 TABLET (1,000 MG TOTAL) BY MOUTH TWICE A DAY WITH FOOD 180 tablet 0   Nintedanib (OFEV) 150 MG CAPS Take 1 capsule (150 mg total) by mouth 2 (two) times daily. 180 capsule 1   oxyCODONE-acetaminophen (PERCOCET/ROXICET) 5-325 MG tablet Take 1 tablet by mouth every 4 (four) hours as needed for pain- frequency change 180 tablet 0   pantoprazole (PROTONIX) 40 MG tablet TAKE 1 TABLET BY MOUTH TWICE A DAY BEFORE A MEAL 180 tablet 1   predniSONE (DELTASONE) 10 MG tablet TAKE 1 TABLET (10 MG TOTAL) BY MOUTH DAILY WITH BREAKFAST. 30 tablet 3   pregabalin (LYRICA) 75 MG capsule TAKE 1 CAPSULE BY MOUTH TWICE A DAY (Patient taking differently: Take 75 mg by mouth 2 (two) times daily.) 60 capsule 3   tamsulosin (FLOMAX) 0.4 MG CAPS capsule TAKE 2 CAPSULES BY MOUTH EVERY DAY 180 capsule 3   tiZANidine (ZANAFLEX) 4 MG tablet Take 1 and 1/2 tablets (6 mg total) by mouth every 4 hours. 180 tablet 2   triamcinolone cream (KENALOG) 0.1 % Apply a small amount to affected area twice a day as needed 30 g 1    venlafaxine XR (EFFEXOR-XR) 150 MG 24 hr capsule TAKE 1 CAPSULE BY MOUTH DAILY WITH BREAKFAST. 90 capsule 0   warfarin (COUMADIN) 5 MG tablet TAKE 1 TO 2 TABLETS DAILY OR AS PRESCRIBED BY COUMADIN CLINIC 130 tablet 1   zolpidem (AMBIEN) 10 MG tablet Take 0.5-1 tablets (5-10 mg total) by mouth at bedtime as needed for sleep. 15 tablet 1   colchicine 0.6 MG tablet Take 1 tablet (0.6 mg total) by mouth 2 (two) times daily for 14 days. 28 tablet 0   oxyCODONE-acetaminophen (PERCOCET/ROXICET) 5-325 MG tablet Take 1 tablet by mouth every 4 (four) hours as needed for moderate pain.     oxyCODONE-acetaminophen (PERCOCET/ROXICET) 5-325 MG tablet Take 1 tablet by mouth every 6 (six) hours as needed for pain (frequency change) 120 tablet 0   oxyCODONE-acetaminophen (PERCOCET/ROXICET) 5-325 MG tablet Take 1 tablet by mouth every 4 (four) hours as needed for pain. 180 tablet 0   [START ON 09/15/2022] oxyCODONE-acetaminophen (PERCOCET/ROXICET) 5-325 MG tablet Take 1 tablet by mouth every 4 (four) hours as needed for pain frequency change (fill 09/15/22) 180 tablet 0   tiZANidine (ZANAFLEX) 4 MG tablet TAKE 1 TABLET BY MOUTH EVERY 6 HOURS AS NEEDED 360 tablet 1   No facility-administered medications prior to visit.    Allergies  Allergen Reactions   Daptomycin     Drug induced  pneumonia    Diltiazem Other (See Comments)    Passed out    Review of Systems  Constitutional:  Positive for chills. Negative for fever.  HENT:  Negative for congestion, sinus pain and sore throat.   Respiratory:  Negative for cough, shortness of breath and wheezing.   Cardiovascular:  Negative for chest pain and palpitations.  Gastrointestinal:  Negative for blood in stool, constipation, diarrhea, nausea and vomiting.  Genitourinary:  Negative for dysuria, frequency and hematuria.  Musculoskeletal:  Positive for back pain. Negative for joint pain and myalgias.       Objective:    Physical Exam Constitutional:       General: He is not in acute distress.    Appearance: Normal appearance. He is not ill-appearing.  HENT:     Head: Normocephalic and atraumatic.     Right Ear: Tympanic membrane, ear canal and external ear normal.     Left Ear: Tympanic membrane, ear canal and external ear normal.  Eyes:     Extraocular Movements: Extraocular movements intact.     Pupils: Pupils are equal, round, and reactive to light.  Cardiovascular:     Rate and Rhythm: Normal rate and regular rhythm.     Heart sounds: Normal heart sounds. No murmur heard.    No gallop.  Pulmonary:     Effort: Pulmonary effort is normal. No respiratory distress.     Breath sounds: Wheezing present. No rales.     Comments: Soft inspiratory wheeze. Skin:    General: Skin is warm and dry.  Neurological:     General: No focal deficit present.     Mental Status: He is alert and oriented to person, place, and time.  Psychiatric:        Mood and Affect: Mood normal.        Behavior: Behavior normal.     BP (!) 87/58 (BP Location: Left Arm, Patient Position: Sitting, Cuff Size: Small)   Pulse 64   Temp 98.7 F (37.1 C) (Oral)   Resp 16   Wt 182 lb (82.6 kg)   SpO2 100%   BMI 26.11 kg/m  Wt Readings from Last 3 Encounters:  08/23/22 182 lb (82.6 kg)  08/02/22 183 lb (83 kg)  06/30/22 193 lb (87.5 kg)   f    Assessment & Plan:   Problem List Items Addressed This Visit       Unprioritized   Male hypogonadism    Feeling OK without Testosterone replacement.       Fatigue - Primary    New.  He has hx of osteomyelitis of the thoracic spine which was treated with prolonged course of antibiotics back in 2022 and followed by ID.  Pt and wife are requesting CRP/ESR to be rechecked.    Also, will check labs as below.  He is on prednisone so his WBC may be elevated.          Relevant Orders   CBC w/Diff   Urinalysis, Routine w reflex microscopic   Urine Culture   Comp Met (CMET)   TSH   Urine Microalbumin w/creat.  ratio   Sedimentation rate   C-reactive protein   Essential hypertension    Initial reading upon arrival was 87/58.  Repeat reading 155/111 and 170/11.  Will not make any medication adjustments at this time, but patient an wife plan to keep a close eye on his bp at home and they will keep upcoming appointment with PCP.  Esophageal reflux    Maintained on pantoprazole daily.  Reports previous hx of ulcers.  Denies any current gerd symptoms.       Diabetes mellitus due to underlying condition with hyperosmolarity without coma, with long-term current use of insulin (HCC)    Lab Results  Component Value Date   HGBA1C 9.0 (H) 03/15/2022   HGBA1C 10.2 (H) 08/16/2021   HGBA1C 7.4 (H) 07/15/2021   Lab Results  Component Value Date   MICROALBUR 0.61 04/23/2012   LDLCALC 70 03/15/2022   CREATININE 0.75 06/16/2022  Maintained on lantus 12 units of lantus as well as metformin and januvia.        COPD (chronic obstructive pulmonary disease) (HCC)    Reports overall breathing has been stable.       Atrial fibrillation ([place])    Maintained on Warfarin. Followed by coumadin clinic.        Other Visit Diagnoses     Urinary frequency       Relevant Orders   Urinalysis, Routine w reflex microscopic   Urine Culture        No orders of the defined types were placed in this encounter.   I, Nance Pear, NP, personally preformed the services described in this documentation.  All medical record entries made by the scribe were at my direction and in my presence.  I have reviewed the chart and discharge instructions (if applicable) and agree that the record reflects my personal performance and is accurate and complete.  08/23/2022.  I,Mathew Stumpf,acting as a Education administrator for [employer], NP.,have documented all relevant documentation on the behalf of Nance Pear, NP,as directed by  Nance Pear, NP while in the presence of Nance Pear,  NP.   Nance Pear, NP

## 2022-08-24 ENCOUNTER — Telehealth: Payer: Self-pay | Admitting: Family

## 2022-08-24 ENCOUNTER — Encounter: Payer: Self-pay | Admitting: Family Medicine

## 2022-08-24 DIAGNOSIS — R0602 Shortness of breath: Secondary | ICD-10-CM | POA: Diagnosis not present

## 2022-08-24 DIAGNOSIS — J9601 Acute respiratory failure with hypoxia: Secondary | ICD-10-CM | POA: Diagnosis not present

## 2022-08-24 DIAGNOSIS — E871 Hypo-osmolality and hyponatremia: Secondary | ICD-10-CM

## 2022-08-24 LAB — URINE CULTURE
MICRO NUMBER:: 14525483
Result:: NO GROWTH
SPECIMEN QUALITY:: ADEQUATE

## 2022-08-24 MED ORDER — LISINOPRIL 5 MG PO TABS: 5.0000 mg | ORAL_TABLET | Freq: Every day | ORAL | 2 refills | Status: DC

## 2022-08-24 NOTE — Telephone Encounter (Signed)
He can increase prednisone to '10mg'$  daily x 1 week then resume '5mg'$   Take delsym cough syrup twice daily

## 2022-08-24 NOTE — Telephone Encounter (Signed)
Mychart messages posted below for review: ARIE POWELL "Ike"  P [facility] (supporting Babb, MD)22 minutes ago (11:37 AM)    I take mucinex DM twice a day. Cough isn't as bad as yesterday.    You  KIPTYN RAFUSE "XUC"76 minutes ago (11:35 AM)    Mr. Riviello,   When you cough, are you coughing up any phlegm? How long has this been going on? Have you tried taking any over the counter medicines to see if it would help with your cough?    FRAN MCREE "Ike"  P [facility] (supporting Brand Males, MD)17 hours ago (6:53 PM)    I've developed a persistent cough in the last few days, along with chills but no fever. I saw a nurse practitioner today and she ordered comprehensive bloodwork. She checked my lungs and heard wheezing. Should I call and schedule an office visit?     Since MR is not available today via [facility] and since pt has seen Beth prior, routing this to her for review. Beth, please advise any recs for pt.

## 2022-08-24 NOTE — Telephone Encounter (Signed)
Called but no answer, will try again later

## 2022-08-24 NOTE — Telephone Encounter (Signed)
Please advise pt that his diabetes is poorly controlled with A1C of 10.4.  This may be in part due to his need for daily prednisone.  I would recommend that he increase his lantus by one 2 units every 2 days until he gets a fasting sugar of 110 or less.  Then continue that dose of lantus and send [place] a mychart message with his new dose.  ESR and CRP are normal suggesting that he does not have a recurrence of his spinal infection.  There is some protein in his urine.  I would recommend that he add lisinopril '5mg'$  once daily for kidney protection.   Total protein in his blood is a little low. Increase lean proteins in his diet (chicken, [place], fish etc.).   Follow up in 1 week for nurse visit to repeat BP and CMET.  Call if sore throat symptoms worsen or if they do not improve in 4-5 days.   Keep upcoming appointment with Dr. [name].

## 2022-08-25 ENCOUNTER — Encounter (HOSPITAL_COMMUNITY): Payer: Self-pay | Admitting: *Deleted

## 2022-08-25 ENCOUNTER — Other Ambulatory Visit: Payer: Self-pay | Admitting: Family Medicine

## 2022-08-25 MED ORDER — TIRZEPATIDE 2.5 MG/0.5ML ~~LOC~~ SOAJ: 2.5000 mg | SUBCUTANEOUS | 1 refills | Status: DC

## 2022-08-25 NOTE — Telephone Encounter (Signed)
Patient notified of all information he is increasing his insulin by 2 units daily until glucose 110 or less. He is starting Lisinopril and schedule for bp check and bmp next week. He will let us know about his throat symptoms.

## 2022-08-30 ENCOUNTER — Telehealth: Payer: Self-pay | Admitting: Licensed Clinical Social Worker

## 2022-08-30 NOTE — Patient Outreach (Signed)
  Care Coordination   08/30/2022 Name: Luis Brown MRN: [id number] DOB: 03-27-1946   Care Coordination Outreach Attempts:  An unsuccessful telephone outreach was attempted today to offer the patient information about available care coordination services as a benefit of their health plan.   Follow Up Plan: Phone disconnected abruptly.  Called back due to being disconnected. Phone hungs up again.    No further outreach attempts will be made at this time. We have been unable to contact the patient to offer or enroll patient in care coordination services  Encounter Outcome:  Pt. Refused   Care Coordination Interventions:  No, not indicated    Casimer Lanius, [place] [phone]

## 2022-08-30 NOTE — Patient Instructions (Signed)
    I am sorry we were continually disconnected   Care Coordination provides support specific to your health needs that extend beyond exceptional routine office care you already receive from your primary care doctor.    If you are eligible for standard Care Coordination, there is no cost to you.  The Care Coordination team is made up of the following team members: Registered Nurse Care Guide: disease management, health education, care coordination and complex case management Clinical Social Work: Complex Care Coordination including coordination of level of care needs, mental and behavioral health assessment and recommendations, and connection to long-term mental health support Clinical Pharmacist: medication management, assistance and disease management Community Resource Care Guides: Forensic psychologist Team: dedicated team of scheduling professionals to support patient and clinical team scheduling needs  Please call [phone] if you would like to schedule a phone appointment with one of the team members.  Luis Brown, [place] [phone]

## 2022-08-31 ENCOUNTER — Ambulatory Visit: Payer: Medicare Other

## 2022-09-01 ENCOUNTER — Ambulatory Visit: Payer: Medicare Other

## 2022-09-01 ENCOUNTER — Encounter: Payer: Self-pay | Admitting: Family Medicine

## 2022-09-01 ENCOUNTER — Telehealth (INDEPENDENT_AMBULATORY_CARE_PROVIDER_SITE_OTHER): Payer: Medicare Other | Admitting: Family Medicine

## 2022-09-01 ENCOUNTER — Other Ambulatory Visit: Payer: Medicare Other

## 2022-09-01 VITALS — BP 122/76 | HR 76 | Ht 70.0 in | Wt 174.0 lb

## 2022-09-01 DIAGNOSIS — E0800 Diabetes mellitus due to underlying condition with hyperosmolarity without nonketotic hyperglycemic-hyperosmolar coma (NKHHC): Secondary | ICD-10-CM

## 2022-09-01 DIAGNOSIS — Z794 Long term (current) use of insulin: Secondary | ICD-10-CM | POA: Diagnosis not present

## 2022-09-01 DIAGNOSIS — I4891 Unspecified atrial fibrillation: Secondary | ICD-10-CM

## 2022-09-01 DIAGNOSIS — D649 Anemia, unspecified: Secondary | ICD-10-CM | POA: Diagnosis not present

## 2022-09-01 DIAGNOSIS — F341 Dysthymic disorder: Secondary | ICD-10-CM

## 2022-09-01 DIAGNOSIS — I10 Essential (primary) hypertension: Secondary | ICD-10-CM

## 2022-09-02 ENCOUNTER — Encounter: Payer: Self-pay | Admitting: Family Medicine

## 2022-09-02 ENCOUNTER — Ambulatory Visit (INDEPENDENT_AMBULATORY_CARE_PROVIDER_SITE_OTHER): Payer: Medicare Other | Admitting: Family Medicine

## 2022-09-02 VITALS — BP 125/68 | HR 80 | Temp 97.7°F | Resp 16 | Ht 70.0 in | Wt 179.2 lb

## 2022-09-02 DIAGNOSIS — H6690 Otitis media, unspecified, unspecified ear: Secondary | ICD-10-CM

## 2022-09-02 DIAGNOSIS — D649 Anemia, unspecified: Secondary | ICD-10-CM | POA: Diagnosis not present

## 2022-09-02 DIAGNOSIS — J029 Acute pharyngitis, unspecified: Secondary | ICD-10-CM | POA: Diagnosis not present

## 2022-09-02 DIAGNOSIS — R6889 Other general symptoms and signs: Secondary | ICD-10-CM

## 2022-09-02 DIAGNOSIS — H6691 Otitis media, unspecified, right ear: Secondary | ICD-10-CM | POA: Diagnosis not present

## 2022-09-02 DIAGNOSIS — E559 Vitamin D deficiency, unspecified: Secondary | ICD-10-CM

## 2022-09-02 LAB — COMPREHENSIVE METABOLIC PANEL WITH GFR
ALT: 19 U/L (ref 0–53)
AST: 20 U/L (ref 0–37)
Albumin: 3.8 g/dL (ref 3.5–5.2)
Alkaline Phosphatase: 115 U/L (ref 39–117)
BUN: 13 mg/dL (ref 6–23)
CO2: 28 meq/L (ref 19–32)
Calcium: 8.8 mg/dL (ref 8.4–10.5)
Chloride: 91 meq/L — ABNORMAL LOW (ref 96–112)
Creatinine, Ser: 0.65 mg/dL (ref 0.40–1.50)
GFR: 89.93 mL/min
Glucose, Bld: 353 mg/dL — ABNORMAL HIGH (ref 70–99)
Potassium: 4.3 meq/L (ref 3.5–5.1)
Sodium: 129 meq/L — ABNORMAL LOW (ref 135–145)
Total Bilirubin: 0.8 mg/dL (ref 0.2–1.2)
Total Protein: 5.9 g/dL — ABNORMAL LOW (ref 6.0–8.3)

## 2022-09-02 LAB — CBC WITH DIFFERENTIAL/PLATELET
Basophils Absolute: 0 K/uL (ref 0.0–0.1)
Basophils Relative: 0.3 % (ref 0.0–3.0)
Eosinophils Absolute: 0.1 K/uL (ref 0.0–0.7)
Eosinophils Relative: 0.5 % (ref 0.0–5.0)
HCT: 38 % — ABNORMAL LOW (ref 39.0–52.0)
Hemoglobin: 12.9 g/dL — ABNORMAL LOW (ref 13.0–17.0)
Lymphocytes Relative: 1.9 % — ABNORMAL LOW (ref 12.0–46.0)
Lymphs Abs: 0.2 K/uL — ABNORMAL LOW (ref 0.7–4.0)
MCHC: 33.8 g/dL (ref 30.0–36.0)
MCV: 87.7 fl (ref 78.0–100.0)
Monocytes Absolute: 1.1 K/uL — ABNORMAL HIGH (ref 0.1–1.0)
Monocytes Relative: 11.2 % (ref 3.0–12.0)
Neutro Abs: 8.8 K/uL — ABNORMAL HIGH (ref 1.4–7.7)
Neutrophils Relative %: 86.1 % — ABNORMAL HIGH (ref 43.0–77.0)
Platelets: 454 K/uL — ABNORMAL HIGH (ref 150.0–400.0)
RBC: 4.33 Mil/uL (ref 4.22–5.81)
RDW: 15.3 % (ref 11.5–15.5)
WBC: 10.2 K/uL (ref 4.0–10.5)

## 2022-09-02 LAB — POCT INFLUENZA A/B
Influenza A, POC: NEGATIVE
Influenza B, POC: NEGATIVE

## 2022-09-02 LAB — VITAMIN D 25 HYDROXY (VIT D DEFICIENCY, FRACTURES): VITD: 14.61 ng/mL — ABNORMAL LOW (ref 30.00–100.00)

## 2022-09-02 LAB — POCT RAPID STREP A (OFFICE): Rapid Strep A Screen: NEGATIVE

## 2022-09-02 MED ORDER — AMOXICILLIN-POT CLAVULANATE 875-125 MG PO TABS: 1.0000 | ORAL_TABLET | Freq: Two times a day (BID) | ORAL | 0 refills | Status: AC

## 2022-09-02 NOTE — Patient Instructions (Addendum)
Right ear with signs of an ear infection. We discussed that this could be viral, but given the severity of your discomfort, I will go ahead and cover with antibiotics.  Continue supportive measures including rest, hydration, humidifier use, steam showers, warm compresses to sinuses, warm liquids with lemon and honey, and over-the-counter cough, cold, and analgesics as needed.    Spoke to Dr. [name] and adding the blood work she was wanting you to get. We will let you know results.

## 2022-09-02 NOTE — Progress Notes (Signed)
Acute Office Visit  Subjective:     Patient ID: GILSON WORKMAN, male    DOB: 09-Apr-1946, 77 y.o.   MRN: [id number]  Chief Complaint  Patient presents with   Sore Throat   Ear Pain    HPI Patient is in today for right ear pain.   Patient reports that for the past 3 days he has had significant right ear pain and sore throat.  He describes the ear pain as constant throbbing up to 7/10.  States it radiates into the right side of his throat which worsens with swallowing.  He denies any fevers, drainage, sinus pressure, headache.  He has been having some cough/congestion/laryngitis for the past week or so -he reach out to his pulmonology provider via [place] and they recommended he increase his prednisone to 10 mg daily for 1 week and then drop back down to his regular 5 mg daily in addition to using Delsym cough syrup twice a day.  He reports he also saw PCP virtually yesterday for routine follow-up and forgot to mention the sore throat and ear pain to her.  States she mentioned getting some blood work and he is wondering if he can do that while he is here today.   ROS All review of systems negative except what is listed in the HPI      Objective:    BP 125/68   Pulse 80   Temp 97.7 F (36.5 C)   Resp 16   Ht 5' 10"$  (1.778 m)   Wt 179 lb 3.2 oz (81.3 kg)   SpO2 94%   BMI 25.71 kg/m    Physical Exam Vitals reviewed.  Constitutional:      General: He is not in acute distress.    Appearance: He is well-developed. He is not ill-appearing.  HENT:     Head: Normocephalic and atraumatic.     Left Ear: Tympanic membrane normal.     Ears:     Comments: Right TM bulging with loss of landmarks, cloudy/yellow, minimal erythema    Mouth/Throat:     Mouth: Mucous membranes are moist.     Pharynx: No posterior oropharyngeal erythema.  Cardiovascular:     Rate and Rhythm: Normal rate and regular rhythm.  Pulmonary:     Effort: Pulmonary effort is normal.     Breath sounds:  Wheezing present. No rhonchi or rales.  Lymphadenopathy:     Cervical: Cervical adenopathy present.  Skin:    General: Skin is warm and dry.  Neurological:     General: No focal deficit present.     Mental Status: He is alert and oriented to person, place, and time. Mental status is at baseline.  Psychiatric:        Mood and Affect: Mood normal.        Behavior: Behavior normal.        Thought Content: Thought content normal.        Judgment: Judgment normal.     Results for orders placed or performed in visit on 09/02/22  POCT Influenza A/B  Result Value Ref Range   Influenza A, POC Negative Negative   Influenza B, POC Negative Negative  POCT rapid strep A  Result Value Ref Range   Rapid Strep A Screen Negative Negative        Assessment & Plan:   Problem List Items Addressed This Visit   None Visit Diagnoses     Flu-like symptoms    -  Primary  Relevant Orders   POCT Influenza A/B (Completed)   Sore throat       Relevant Orders   POCT rapid strep A (Completed)   Acute otitis media, unspecified otitis media type       Relevant Medications   amoxicillin-clavulanate (AUGMENTIN) 875-125 MG tablet   Hypocalcemia       Relevant Orders   Comprehensive metabolic panel   VITAMIN D 25 Hydroxy (Vit-D Deficiency, Fractures)   Anemia, unspecified type       Relevant Orders   CBC with Differential/Platelet   Iron, TIBC and Ferritin Panel      Negative COVID test at home.  Negative flu and strep test in office. Right ear with signs of an ear infection. We discussed that this could be viral, but given the severity of your discomfort, I will go ahead and cover with antibiotics.  Breath sounds without rhonchi or rails today, but mild wheezing.  Continue prednisone per pulmonologist recommendations.  Will treat ear infection with Augmentin to cover other respiratory infections as well.  Follow-up if not improving. Continue supportive measures including rest, hydration,  humidifier use, steam showers, warm compresses to sinuses, warm liquids with lemon and honey, and over-the-counter cough, cold, and analgesics as needed.    Spoke to Dr. [name] and adding the blood work she was wanting you to get. We will let you know results.    Meds ordered this encounter  Medications   amoxicillin-clavulanate (AUGMENTIN) 875-125 MG tablet    Sig: Take 1 tablet by mouth 2 (two) times daily for 7 days.    Dispense:  14 tablet    Refill:  0    Order Specific Question:   Supervising Provider    Answer:   Penni Homans A [[id number]]   Please contact office for follow-up if symptoms do not improve or worsen. Seek emergency care if symptoms become severe.    Return if symptoms worsen or fail to improve.  Terrilyn Saver, NP

## 2022-09-03 LAB — IRON,TIBC AND FERRITIN PANEL
%SAT: 25 % (calc) (ref 20–48)
Ferritin: 116 ng/mL (ref 24–380)
Iron: 76 ug/dL (ref 50–180)
TIBC: 305 mcg/dL (calc) (ref 250–425)

## 2022-09-04 DIAGNOSIS — D649 Anemia, unspecified: Secondary | ICD-10-CM | POA: Insufficient documentation

## 2022-09-04 MED ORDER — LISINOPRIL 5 MG PO TABS: 2.5000 mg | ORAL_TABLET | Freq: Every day | ORAL | 2 refills | Status: DC

## 2022-09-04 NOTE — Assessment & Plan Note (Addendum)
After a long discussion the decision was made to increase insulin by 2 units every 3 days until sugars are consistently 200 or less and he has had no numbers below 80. He does note he continues to have a very poor appetite so he is reminded if he does not feel he will eat well that day he can hold his metformin and drop the dosing of his insulin as needed. He is reminded to try and take in protein every 4 hours to keep his sugars from dropping.

## 2022-09-04 NOTE — Assessment & Plan Note (Signed)
Monitor and report any concerns, no changes to meds. Encouraged heart healthy diet such as the DASH diet and exercise as tolerated.  ?

## 2022-09-04 NOTE — Assessment & Plan Note (Signed)
Repeat CBC and iron studies

## 2022-09-04 NOTE — Assessment & Plan Note (Signed)
asymptomatic

## 2022-09-04 NOTE — Assessment & Plan Note (Signed)
Repeat CMP and Vitamin D levels

## 2022-09-04 NOTE — Assessment & Plan Note (Signed)
His wife and he confirm he is managing his stress well no meds added today

## 2022-09-04 NOTE — Progress Notes (Signed)
MyChart Video Visit    Virtual Visit via Video Note   This visit type was conducted due to national recommendations for restrictions regarding the COVID-19 Pandemic (e.g. social distancing) in an effort to limit this patient's exposure and mitigate transmission in our community. This patient is at least at moderate risk for complications without adequate follow up. This format is felt to be most appropriate for this patient at this time. Physical exam was limited by quality of the video and audio technology used for the visit. [name], CMA was able to get the patient set up on a video visit.  Patient location: home, Patient, patient's spouse and provider in visit Provider location: Office  I discussed the limitations of evaluation and management by telemedicine and the availability of in person appointments. The patient expressed understanding and agreed to proceed.  Visit Date: 09/01/2022  Today's healthcare provider: Penni Homans, MD     Subjective:    [name], male    DOB: [birth year], 77 y.o.   MRN: [id number]  Chief Complaint  Patient presents with   Follow-up    HPI Patient is in today for follow up on chronic medical concerns. He is accompanied by his wife in the visit and they confirm he has felt off for about 10 days. More fatigue today. He has no significant appetite but he denies nausea, vomiting, diarrhea, constipation. Denies CP/palp/SOB/HA/fevers or GU c/o. 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   Esophageal reflux 02/10/2015   Fatigue    HTN (hypertension)    Hyperglycemia    preDM    Hyperlipidemia    Insomnia    Low back pain radiating to both legs 01/16/2017   Measles as a child   Overweight(278.02)    Peripheral neuropathy 08/07/2019   Personal history of colonic polyps 10/27/2012   Follows with [facility]   Pneumonia    " walking"   Preventative health care    Testosterone deficiency 05/23/2012   Wears glasses     Past Surgical History:  Procedure Laterality Date   ATRIAL FIBRILLATION ABLATION N/A 01/27/2022   Procedure: ATRIAL FIBRILLATION ABLATION;  Surgeon: Constance Haw, MD;  Location: [facility];  Service: Cardiovascular;  Laterality: N/A;   BACK SURGERY  2012 and 1994   Dr Margret Chance, screws and cage in low back   BIOPSY  01/09/2020   Procedure: BIOPSY;  Surgeon: Otis Brace, MD;  Location: [facility];  Service: Gastroenterology;;   BRONCHIAL BIOPSY  11/17/2020   Procedure: BRONCHIAL BIOPSIES;  Surgeon: Laurin Coder, MD;  Location: [facility];  Service: Endoscopy;;   BRONCHIAL BRUSHINGS  11/17/2020   Procedure: BRONCHIAL BRUSHINGS;  Surgeon: Laurin Coder, MD;  Location: [facility];  Service: Endoscopy;;   BRONCHIAL WASHINGS  11/17/2020   Procedure: BRONCHIAL WASHINGS;  Surgeon: Laurin Coder, MD;  Location: [facility];  Service: Endoscopy;;   COLONOSCOPY WITH PROPOFOL N/A 01/09/2020  Procedure: COLONOSCOPY WITH PROPOFOL;  Surgeon: Otis Brace, MD;  Location: [facility];  Service: Gastroenterology;  Laterality: N/A;   ESOPHAGOGASTRODUODENOSCOPY (EGD) WITH PROPOFOL N/A 01/09/2020   Procedure: ESOPHAGOGASTRODUODENOSCOPY (EGD) WITH PROPOFOL;  Surgeon: Otis Brace, MD;  Location: [facility];  Service: Gastroenterology;  Laterality: N/A;   EYE SURGERY Bilateral    2016   HARDWARE REVISION  03/12/2019   REVISION OF HARDWARE THORACIC TEN-THORACIC ELEVEN WITH APPLICATION OF  ADDITIONAL RODS AND ROD SLEEVES THORACIC EIGHT-THORACIC TWELVE 12-LUMBAR ONE (N/A )   HEMOSTASIS CONTROL  11/17/2020   Procedure: HEMOSTASIS CONTROL;  Surgeon: Laurin Coder, MD;  Location: [facility];  Service: Endoscopy;;   IR US GUIDE BX ASP/DRAIN  11/13/2020   LAMINECTOMY  02/12/2019   DECOMPRESSIVE LAMINECTOMY THORACIC NINE-THORACIC TEN AND THORACIC TEN-THORACIC ELEVEN, EXTENSION OF THORACOLUMBAR FUSION FROM THORACIC TEN TO THORACIC FIVE, LOCAL BONE GRAFT, ALLOGRAFT AND VIVIGEN (N/A)   POLYPECTOMY  01/09/2020   Procedure: POLYPECTOMY;  Surgeon: Otis Brace, MD;  Location: [facility];  Service: Gastroenterology;;   REMOVAL OF RODS AND PEDICLE SCREWS T5 AND T6, EXPLORATION OF FUSION, BIOPSY TRANSPEDICULAR T5 AND T6 (N/A )  09/14/2020   SPINAL FUSION N/A 02/12/2019   Procedure: DECOMPRESSIVE LAMINECTOMY THORACIC NINE-THORACIC TEN AND THORACIC TEN-THORACIC ELEVEN, EXTENSION OF THORACOLUMBAR FUSION FROM THORACIC TEN TO THORACIC FIVE, LOCAL BONE GRAFT, ALLOGRAFT AND VIVIGEN;  Surgeon: Jessy Oto, MD;  Location: [place];  Service: Orthopedics;  Laterality: N/A;  DECOMPRESSIVE LAMINECTOMY THORACIC NINE-THORACIC TEN AND THORACIC TEN-THORACIC ELEVEN, EXTENSION OF TH   SPINAL FUSION N/A 03/12/2019   Procedure: REVISION OF HARDWARE THORACIC TEN-THORACIC ELEVEN WITH APPLICATION OF ADDITIONAL RODS AND ROD SLEEVES THORACIC EIGHT-THORACIC TWELVE 12-LUMBAR ONE;  Surgeon: Jessy Oto, MD;  Location: [place] [place];  Service: Orthopedics;  Laterality: N/A;   TONSILLECTOMY     as child   torn rotator cuff  2010   right   VIDEO BRONCHOSCOPY N/A 11/17/2020   Procedure: VIDEO BRONCHOSCOPY WITH FLUORO;  Surgeon: Laurin Coder, MD;  Location: [facility];  Service: Endoscopy;  Laterality: N/A;    Family History  Problem Relation Age of Onset   Hypertension Mother    Diabetes Mother        type 2   Cancer Mother 24       breast in remission   Emphysema Father        smoker   COPD Father         smoker   Stroke Father 75       mini   Heart disease Father    Hypertension Sister    Hyperlipidemia Sister    Scoliosis Sister    Osteoporosis Sister    Hypertension Maternal Grandmother    Scoliosis Maternal Grandmother    Heart disease Maternal Grandfather    Heart disease Paternal Grandfather        smoker   Heart disease Daughter     Social History   Socioeconomic History   Marital status: Married    Spouse name: Not on file   Number of children: Not on file   Years of education: Not on file   Highest education level: Not on file  Occupational History   Not on file  Tobacco Use   Smoking status: Former    Packs/day: 1.00    Years: 20.00    Total pack years: 20.00    Types: Cigarettes    Quit date: 07/18/1989    Years since quitting: 33.1   Smokeless tobacco: Never  Tobacco comments:    Former smoker 03/16/22  Vaping Use   Vaping Use: Never used  Substance and Sexual Activity   Alcohol use: Yes    Alcohol/week: 2.0 standard drinks of alcohol    Types: 2 Standard drinks or equivalent per week    Comment: couple beers here and there 03/16/22   Drug use: No   Sexual activity: Yes    Comment: lives with wife, still working, no dietary restrictions, continues to exercise intermittently  Other Topics Concern   Not on file  Social History Narrative   Not on file   Social Determinants of Health   Financial Resource Strain: Low Risk  (01/27/2021)   Overall Financial Resource Strain (CARDIA)    Difficulty of Paying Living Expenses: Not hard at all  Food Insecurity: No Food Insecurity (01/27/2021)   Hunger Vital Sign    Worried About Running Out of Food in the Last Year: Never true    [place] in the Last Year: Never true  Transportation Needs: No Transportation Needs (01/27/2021)   PRAPARE - Hydrologist (Medical): No    Lack of Transportation (Non-Medical): No  Physical Activity: Sufficiently Active (01/27/2021)   Exercise  Vital Sign    Days of Exercise per Week: 7 days    Minutes of Exercise per Session: 30 min  Stress: No Stress Concern Present (01/27/2021)   [place]    Feeling of Stress : Not at all  Social Connections: Moderately Integrated (01/27/2021)   Social Connection and Isolation Panel [NHANES]    Frequency of Communication with Friends and Family: More than three times a week    Frequency of Social Gatherings with Friends and Family: More than three times a week    Attends Religious Services: More than 4 times per year    Active Member of [employer] or Organizations: No    Attends Archivist Meetings: Never    Marital Status: Married  Human resources officer Violence: Not At Risk (01/27/2021)   Humiliation, Afraid, Rape, and Kick questionnaire    Fear of Current or Ex-Partner: No    Emotionally Abused: No    Physically Abused: No    Sexually Abused: No    Outpatient Medications Prior to Visit  Medication Sig Dispense Refill   albuterol (PROVENTIL HFA) 108 (90 Base) MCG/ACT inhaler TAKE 2 PUFFS BY MOUTH EVERY 6 HOURS AS NEEDED FOR WHEEZE OR SHORTNESS OF BREATH 8 g 5   atorvastatin (LIPITOR) 10 MG tablet TAKE 1 TABLET BY MOUTH EVERY DAY 90 tablet 1   B-D UF III MINI PEN NEEDLES 31G X 5 MM MISC USE WITH LANTUS 100 each 0   Baclofen 5 MG TABS Take 1 tablet by mouth 2 (two) times daily. 60 tablet 1   busPIRone (BUSPAR) 15 MG tablet Take 1 tablet (15 mg total) by mouth 2 (two) times daily. 180 tablet 0   dextromethorphan-guaiFENesin (MUCINEX DM) 30-600 MG 12hr tablet Take 1 tablet by mouth 2 (two) times daily.     famciclovir (FAMVIR) 500 MG tablet TAKE 1 TABLET BY MOUTH THREE TIMES A DAY 21 tablet 0   fluticasone (FLONASE) 50 MCG/ACT nasal spray SPRAY 2 SPRAYS INTO EACH NOSTRIL EVERY DAY 48 mL 1   Fluticasone-Umeclidin-Vilant (TRELEGY ELLIPTA) 200-62.5-25 MCG/ACT AEPB Inhale 1 puff into the lungs daily. 60 each 5   glucose blood  (CONTOUR NEXT TEST) test strip Check blood sugar once daily 100  each 12   insulin glargine (LANTUS SOLOSTAR) 100 UNIT/ML Solostar Pen INJECT 10 UNITS INTO THE SKIN DAILY 100 mL 2   JANUVIA 50 MG tablet TAKE 1 TABLET BY MOUTH EVERY DAY 30 tablet 3   metFORMIN (GLUCOPHAGE) 1000 MG tablet TAKE 1 TABLET (1,000 MG TOTAL) BY MOUTH TWICE A DAY WITH FOOD 180 tablet 0   Nintedanib (OFEV) 150 MG CAPS Take 1 capsule (150 mg total) by mouth 2 (two) times daily. 180 capsule 1   oxyCODONE-acetaminophen (PERCOCET/ROXICET) 5-325 MG tablet Take 1 tablet by mouth every 4 (four) hours as needed for pain- frequency change 180 tablet 0   pantoprazole (PROTONIX) 40 MG tablet TAKE 1 TABLET BY MOUTH TWICE A DAY BEFORE A MEAL 180 tablet 1   predniSONE (DELTASONE) 10 MG tablet TAKE 1 TABLET (10 MG TOTAL) BY MOUTH DAILY WITH BREAKFAST. 30 tablet 3   pregabalin (LYRICA) 75 MG capsule TAKE 1 CAPSULE BY MOUTH TWICE A DAY (Patient taking differently: Take 75 mg by mouth 2 (two) times daily.) 60 capsule 3   tamsulosin (FLOMAX) 0.4 MG CAPS capsule TAKE 2 CAPSULES BY MOUTH EVERY DAY 180 capsule 3   tiZANidine (ZANAFLEX) 4 MG tablet Take 1 and 1/2 tablets (6 mg total) by mouth every 4 hours. 180 tablet 2   triamcinolone cream (KENALOG) 0.1 % Apply a small amount to affected area twice a day as needed 30 g 1   venlafaxine XR (EFFEXOR-XR) 150 MG 24 hr capsule TAKE 1 CAPSULE BY MOUTH DAILY WITH BREAKFAST. 90 capsule 0   warfarin (COUMADIN) 5 MG tablet TAKE 1 TO 2 TABLETS DAILY OR AS PRESCRIBED BY COUMADIN CLINIC 130 tablet 1   zolpidem (AMBIEN) 10 MG tablet Take 0.5-1 tablets (5-10 mg total) by mouth at bedtime as needed for sleep. 15 tablet 1   tirzepatide (MOUNJARO) 2.5 MG/0.5ML Pen Inject 2.5 mg into the skin once a week. 2 mL 1   lisinopril (ZESTRIL) 5 MG tablet Take 1 tablet (5 mg total) by mouth daily. 30 tablet 2   No facility-administered medications prior to visit.    Allergies  Allergen Reactions   Daptomycin     Drug  induced pneumonia    Diltiazem Other (See Comments)    Passed out    Review of Systems  Constitutional:  Positive for malaise/fatigue. Negative for fever.  HENT:  Positive for congestion.   Eyes:  Negative for blurred vision.  Respiratory:  Negative for shortness of breath.   Cardiovascular:  Negative for chest pain, palpitations and leg swelling.  Gastrointestinal:  Negative for abdominal pain, blood in stool and nausea.  Genitourinary:  Negative for dysuria and frequency.  Musculoskeletal:  Negative for falls.  Skin:  Negative for rash.  Neurological:  Negative for dizziness, loss of consciousness and headaches.  Endo/Heme/Allergies:  Negative for environmental allergies.  Psychiatric/Behavioral:  Negative for depression. The patient is not nervous/anxious.        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 He does note he continues to have a very poor appetite so he is reminded if he does not feel he will eat well that day he can hold his metformin and drop the dosing of his insulin as needed. He is reminded to try and take in protein every 4 hours to keep his sugars from dropping.   Essential hypertension Assessment & Plan: Monitor and report any concerns, no changes to meds. Encouraged heart healthy diet such as the DASH diet and exercise as tolerated.     Dysthymic disorder Assessment & Plan: His wife and he confirm he is managing his stress well no meds added today   Anemia, unspecified type Assessment & Plan: Repeat CBC and iron studies   Hypocalcemia Assessment & Plan: Repeat CMP and Vitamin D levels   Other orders -     Lisinopril; Take 0.5 tablets (2.5 mg total) by mouth daily.  Dispense: 30 tablet; Refill: 2     I discussed the assessment and treatment plan with the patient. The patient was provided an opportunity to ask questions and all were answered. The patient agreed with the plan and demonstrated an understanding of the instructions.   The patient was advised to call back or seek an in-person evaluation if the symptoms worsen or if the condition fails to improve as anticipated.  Penni Homans, MD [facility] at [place] (phone) [phone] (fax)  [place]

## 2022-09-05 MED ORDER — VITAMIN D (ERGOCALCIFEROL) 1.25 MG (50000 UNIT) PO CAPS: 50000.0000 [IU] | ORAL_CAPSULE | ORAL | 0 refills | Status: DC

## 2022-09-05 NOTE — Addendum Note (Signed)
Addended by: Caleen Jobs B on: 09/05/2022 08:24 AM   Modules accepted: Orders

## 2022-09-09 ENCOUNTER — Encounter: Payer: Self-pay | Admitting: Family Medicine

## 2022-09-09 DIAGNOSIS — I480 Paroxysmal atrial fibrillation: Secondary | ICD-10-CM

## 2022-09-10 MED ORDER — ZOLPIDEM TARTRATE 10 MG PO TABS: 5.0000 mg | ORAL_TABLET | Freq: Every evening | ORAL | 1 refills | Status: DC | PRN

## 2022-09-12 ENCOUNTER — Ambulatory Visit: Payer: Medicare Other | Attending: Cardiology | Admitting: *Deleted

## 2022-09-12 DIAGNOSIS — I480 Paroxysmal atrial fibrillation: Secondary | ICD-10-CM | POA: Diagnosis not present

## 2022-09-12 DIAGNOSIS — Z5181 Encounter for therapeutic drug level monitoring: Secondary | ICD-10-CM | POA: Diagnosis not present

## 2022-09-12 LAB — POCT INR: POC INR: 3.7

## 2022-09-12 NOTE — Patient Instructions (Signed)
Description   Do not take any warfarin tomorrow then START taking warfarin 1 tablet daily except 1.5 tablets on Tuesdays and Saturdays.  Stay consistent with greens each week. Recheck INR in 2 weeks.  Coumadin Clinic# [phone]

## 2022-09-13 ENCOUNTER — Encounter: Payer: Self-pay | Admitting: Family Medicine

## 2022-09-15 ENCOUNTER — Ambulatory Visit (INDEPENDENT_AMBULATORY_CARE_PROVIDER_SITE_OTHER): Payer: Medicare Other | Admitting: Family Medicine

## 2022-09-15 VITALS — BP 138/78 | HR 100 | Temp 97.5°F | Resp 16 | Ht 70.0 in | Wt 179.8 lb

## 2022-09-15 DIAGNOSIS — J439 Emphysema, unspecified: Secondary | ICD-10-CM | POA: Diagnosis not present

## 2022-09-15 DIAGNOSIS — E0800 Diabetes mellitus due to underlying condition with hyperosmolarity without nonketotic hyperglycemic-hyperosmolar coma (NKHHC): Secondary | ICD-10-CM

## 2022-09-15 DIAGNOSIS — E782 Mixed hyperlipidemia: Secondary | ICD-10-CM | POA: Diagnosis not present

## 2022-09-15 DIAGNOSIS — I480 Paroxysmal atrial fibrillation: Secondary | ICD-10-CM

## 2022-09-15 DIAGNOSIS — I10 Essential (primary) hypertension: Secondary | ICD-10-CM

## 2022-09-15 DIAGNOSIS — Z794 Long term (current) use of insulin: Secondary | ICD-10-CM

## 2022-09-15 DIAGNOSIS — E0865 Diabetes mellitus due to underlying condition with hyperglycemia: Secondary | ICD-10-CM | POA: Diagnosis not present

## 2022-09-15 MED ORDER — LANTUS SOLOSTAR 100 UNIT/ML ~~LOC~~ SOPN: 17.0000 [IU] | PEN_INJECTOR | Freq: Every day | SUBCUTANEOUS | 2 refills | Status: DC

## 2022-09-15 NOTE — Progress Notes (Signed)
Subjective:   By signing my name below, I, Luis Brown, attest that this documentation has been prepared under the direction and in the presence of Luis Lukes, MD., 09/15/2022.   [name], male    DOB: 1946/06/23, 77 y.o.   MRN: [id number]  Chief Complaint  Patient presents with   Blood Sugar Problem    Here for Blood Sugar    HPI Patient is in today for an office visit and is accompanied by his wife. He denies CP/palpitations/SOB/HA/congestion/ fever/chills/GI or GU symptoms.  Back Pain He continues taking Oxycodone-Acetaminophen 5-325 mg and Tizanidine 4 mg to manage his back pain.  Hyperglycemia  Patient presents today with elevated blood sugar and reports that his blood sugar has recently been within the range of 200-250 ng/mL at home. He further reports that his pulmonologist Dr. Chase Brown recently increased his Prednisone from 5 mg to 10 mg for one week, but he is now back to taking 5 mg. He suspects this could have caused his blood sugar to increase. Continues taking Januvia 50 mg, Metformin 1000 mg, and 15 units of Lantus daily. He has stopped taking Lisinopril 5 mg. His wife reports that he feels lightheaded after taking his medications in the morning and is wondering if he should adjust his medication schedule.   Right Eye Irritation He reports that his right eye is constantly itchy and watery. He has been managing this with Refresh eye drops.  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   Esophageal reflux  02/10/2015   Fatigue    HTN (hypertension)    Hyperglycemia    preDM    Hyperlipidemia    Insomnia    Low back pain radiating to both legs 01/16/2017   Measles as a child   Overweight(278.02)    Peripheral neuropathy 08/07/2019   Personal history of colonic polyps 10/27/2012   Follows with [facility]   Pneumonia    " walking"   Preventative health care    Testosterone deficiency 05/23/2012   Wears glasses     Past Surgical History:  Procedure Laterality Date   ATRIAL FIBRILLATION ABLATION N/A 01/27/2022   Procedure: ATRIAL FIBRILLATION ABLATION;  Surgeon: Constance Haw, MD;  Location: [facility];  Service: Cardiovascular;  Laterality: N/A;   BACK SURGERY  2012 and 1994   Dr Margret Chance, screws and cage in low back   BIOPSY  01/09/2020   Procedure: BIOPSY;  Surgeon: Otis Brace, MD;  Location: [facility];  Service: Gastroenterology;;   BRONCHIAL BIOPSY  11/17/2020   Procedure: BRONCHIAL BIOPSIES;  Surgeon: Laurin Coder, MD;  Location: [facility];  Service: Endoscopy;;   BRONCHIAL BRUSHINGS  11/17/2020   Procedure: BRONCHIAL BRUSHINGS;  Surgeon: Laurin Coder, MD;  Location: [facility];  Service: Endoscopy;;   BRONCHIAL WASHINGS  11/17/2020   Procedure: BRONCHIAL WASHINGS;  Surgeon: Laurin Coder, MD;  Location: [facility];  Service: Endoscopy;;   COLONOSCOPY WITH PROPOFOL N/A 01/09/2020  Procedure: COLONOSCOPY WITH PROPOFOL;  Surgeon: Otis Brace, MD;  [facility]: [facility];  Service: Gastroenterology;  Laterality: N/A;   ESOPHAGOGASTRODUODENOSCOPY (EGD) WITH PROPOFOL N/A 01/09/2020   Procedure: ESOPHAGOGASTRODUODENOSCOPY (EGD) WITH PROPOFOL;  Surgeon: Otis Brace, MD;  [facility]: [facility];  Service: Gastroenterology;  Laterality: N/A;   EYE SURGERY Bilateral    2016   HARDWARE REVISION  03/12/2019   REVISION OF HARDWARE THORACIC TEN-THORACIC ELEVEN WITH APPLICATION OF ADDITIONAL RODS AND ROD SLEEVES THORACIC EIGHT-THORACIC  TWELVE 12-LUMBAR ONE (N/A )   HEMOSTASIS CONTROL  11/17/2020   Procedure: HEMOSTASIS CONTROL;  Surgeon: Laurin Coder, MD;  Location: [facility];  Service: Endoscopy;;   IR US GUIDE BX ASP/DRAIN  11/13/2020   LAMINECTOMY  02/12/2019   DECOMPRESSIVE LAMINECTOMY THORACIC NINE-THORACIC TEN AND THORACIC TEN-THORACIC ELEVEN, EXTENSION OF THORACOLUMBAR FUSION FROM THORACIC TEN TO THORACIC FIVE, LOCAL BONE GRAFT, ALLOGRAFT AND VIVIGEN (N/A)   POLYPECTOMY  01/09/2020   Procedure: POLYPECTOMY;  Surgeon: Otis Brace, MD;  Location: [facility];  Service: Gastroenterology;;   REMOVAL OF RODS AND PEDICLE SCREWS T5 AND T6, EXPLORATION OF FUSION, BIOPSY TRANSPEDICULAR T5 AND T6 (N/A )  09/14/2020   SPINAL FUSION N/A 02/12/2019   Procedure: DECOMPRESSIVE LAMINECTOMY THORACIC NINE-THORACIC TEN AND THORACIC TEN-THORACIC ELEVEN, EXTENSION OF THORACOLUMBAR FUSION FROM THORACIC TEN TO THORACIC FIVE, LOCAL BONE GRAFT, ALLOGRAFT AND VIVIGEN;  Surgeon: Jessy Oto, MD;  Location: [facility];  Service: Orthopedics;  Laterality: N/A;  DECOMPRESSIVE LAMINECTOMY THORACIC NINE-THORACIC TEN AND THORACIC TEN-THORACIC ELEVEN, EXTENSION OF TH   SPINAL FUSION N/A 03/12/2019   Procedure: REVISION OF HARDWARE THORACIC TEN-THORACIC ELEVEN WITH APPLICATION OF ADDITIONAL RODS AND ROD SLEEVES THORACIC EIGHT-THORACIC TWELVE 12-LUMBAR ONE;  Surgeon: Jessy Oto, MD;  Location: [facility];  Service: Orthopedics;  Laterality: N/A;   TONSILLECTOMY     as child   torn rotator cuff  2010   right   VIDEO BRONCHOSCOPY N/A 11/17/2020   Procedure: VIDEO BRONCHOSCOPY WITH FLUORO;  Surgeon: Laurin Coder, MD;  Location: [facility];  Service: Endoscopy;  Laterality: N/A;    Family History  Problem Relation Age of Onset   Hypertension Mother    Diabetes Mother        type 2   Cancer Mother 31       breast in remission   Emphysema Father        smoker   COPD Father        smoker   Stroke Father 9       mini   Heart disease  Father    Hypertension Sister    Hyperlipidemia Sister    Scoliosis Sister    Osteoporosis Sister    Hypertension Maternal Grandmother    Scoliosis Maternal Grandmother    Heart disease Maternal Grandfather    Heart disease Paternal Grandfather        smoker   Heart disease Daughter     Social History   Socioeconomic History   Marital status: Married    Spouse name: Not on file   Number of children: Not on file   Years of education: Not on file   Highest education level: Not on file  Occupational History   Not on file  Tobacco Use   Smoking status: Former    Packs/day: 1.00    Years: 20.00    Total pack years: 20.00    Types: Cigarettes    Quit date: 07/18/1989    Years since quitting: 33.1   Smokeless tobacco: Never  Tobacco comments:    Former smoker 03/16/22  Vaping Use   Vaping Use: Never used  Substance and Sexual Activity   Alcohol use: Yes    Alcohol/week: 2.0 standard drinks of alcohol    Types: 2 Standard drinks or equivalent per week    Comment: couple beers here and there 03/16/22   Drug use: No   Sexual activity: Yes    Comment: lives with wife, still working, no dietary restrictions, continues to exercise intermittently  Other Topics Concern   Not on file  Social History Narrative   Not on file   Social Determinants of Health   Financial Resource Strain: Low Risk  (01/27/2021)   Overall Financial Resource Strain (CARDIA)    Difficulty of Paying Living Expenses: Not hard at all  Food Insecurity: No Food Insecurity (01/27/2021)   Hunger Vital Sign    Worried About Running Out of Food in the Last Year: Never true    [place] in the Last Year: Never true  Transportation Needs: No Transportation Needs (01/27/2021)   PRAPARE - Hydrologist (Medical): No    Lack of Transportation (Non-Medical): No  Physical Activity: Sufficiently Active (01/27/2021)   Exercise Vital Sign    Days of Exercise per Week: 7 days     Minutes of Exercise per Session: 30 min  Stress: No Stress Concern Present (01/27/2021)   [place]    Feeling of Stress : Not at all  Social Connections: Moderately Integrated (01/27/2021)   Social Connection and Isolation Panel [NHANES]    Frequency of Communication with Friends and Family: More than three times a week    Frequency of Social Gatherings with Friends and Family: More than three times a week    Attends Religious Services: More than 4 times per year    Active Member of [employer] or Organizations: No    Attends Archivist Meetings: Never    Marital Status: Married  Human resources officer Violence: Not At Risk (01/27/2021)   Humiliation, Afraid, Rape, and Kick questionnaire    Fear of Current or Ex-Partner: No    Emotionally Abused: No    Physically Abused: No    Sexually Abused: No   Outpatient Medications Prior to Visit  Medication Sig Dispense Refill   albuterol (PROVENTIL HFA) 108 (90 Base) MCG/ACT inhaler TAKE 2 PUFFS BY MOUTH EVERY 6 HOURS AS NEEDED FOR WHEEZE OR SHORTNESS OF BREATH 8 g 5   atorvastatin (LIPITOR) 10 MG tablet TAKE 1 TABLET BY MOUTH EVERY DAY 90 tablet 1   B-D UF III MINI PEN NEEDLES 31G X 5 MM MISC USE WITH LANTUS 100 each 0   Baclofen 5 MG TABS Take 1 tablet by mouth 2 (two) times daily. 60 tablet 1   busPIRone (BUSPAR) 15 MG tablet Take 1 tablet (15 mg total) by mouth 2 (two) times daily. 180 tablet 0   dextromethorphan-guaiFENesin (MUCINEX DM) 30-600 MG 12hr tablet Take 1 tablet by mouth 2 (two) times daily.     famciclovir (FAMVIR) 500 MG tablet TAKE 1 TABLET BY MOUTH THREE TIMES A DAY 21 tablet 0   fluticasone (FLONASE) 50 MCG/ACT nasal spray SPRAY 2 SPRAYS INTO EACH NOSTRIL EVERY DAY 48 mL 1   Fluticasone-Umeclidin-Vilant (TRELEGY ELLIPTA) 200-62.5-25 MCG/ACT AEPB Inhale 1 puff into the lungs daily. 60 each 5   glucose blood (CONTOUR NEXT TEST) test strip Check blood sugar once  daily 100 each  12   JANUVIA 50 MG tablet TAKE 1 TABLET BY MOUTH EVERY DAY 30 tablet 3   metFORMIN (GLUCOPHAGE) 1000 MG tablet TAKE 1 TABLET (1,000 MG TOTAL) BY MOUTH TWICE A DAY WITH FOOD 180 tablet 0   Nintedanib (OFEV) 150 MG CAPS Take 1 capsule (150 mg total) by mouth 2 (two) times daily. 180 capsule 1   oxyCODONE-acetaminophen (PERCOCET/ROXICET) 5-325 MG tablet Take 1 tablet by mouth every 4 (four) hours as needed for pain- frequency change 180 tablet 0   pantoprazole (PROTONIX) 40 MG tablet TAKE 1 TABLET BY MOUTH TWICE A DAY BEFORE A MEAL 180 tablet 1   predniSONE (DELTASONE) 10 MG tablet TAKE 1 TABLET (10 MG TOTAL) BY MOUTH DAILY WITH BREAKFAST. 30 tablet 3   pregabalin (LYRICA) 75 MG capsule TAKE 1 CAPSULE BY MOUTH TWICE A DAY (Patient taking differently: Take 75 mg by mouth 2 (two) times daily.) 60 capsule 3   tamsulosin (FLOMAX) 0.4 MG CAPS capsule TAKE 2 CAPSULES BY MOUTH EVERY DAY 180 capsule 3   tiZANidine (ZANAFLEX) 4 MG tablet Take 1 and 1/2 tablets (6 mg total) by mouth every 4 hours. 180 tablet 2   triamcinolone cream (KENALOG) 0.1 % Apply a small amount to affected area twice a day as needed 30 g 1   venlafaxine XR (EFFEXOR-XR) 150 MG 24 hr capsule TAKE 1 CAPSULE BY MOUTH DAILY WITH BREAKFAST. 90 capsule 0   Vitamin D, Ergocalciferol, (DRISDOL) 1.25 MG (50000 UNIT) CAPS capsule Take 1 capsule (50,000 Units total) by mouth every 7 (seven) days. 8 capsule 0   warfarin (COUMADIN) 5 MG tablet TAKE 1 TO 2 TABLETS DAILY OR AS PRESCRIBED BY COUMADIN CLINIC 130 tablet 1   zolpidem (AMBIEN) 10 MG tablet Take 0.5-1 tablets (5-10 mg total) by mouth at bedtime as needed for sleep. 15 tablet 1   insulin glargine (LANTUS SOLOSTAR) 100 UNIT/ML Solostar Pen INJECT 10 UNITS INTO THE SKIN DAILY 100 mL 2   lisinopril (ZESTRIL) 5 MG tablet Take 0.5 tablets (2.5 mg total) by mouth daily. 30 tablet 2   No facility-administered medications prior to visit.    Allergies  Allergen Reactions    Daptomycin     Drug induced pneumonia    Diltiazem Other (See Comments)    Passed out    Review of Systems  Constitutional:  Negative for chills and fever.  HENT:  Negative for congestion.   Respiratory:  Negative for shortness of breath.   Cardiovascular:  Negative for chest pain and palpitations.  Gastrointestinal:  Negative for abdominal pain, blood in stool, constipation, diarrhea, nausea and vomiting.  Genitourinary:  Negative for dysuria, frequency, hematuria and urgency.  Skin:           Neurological:  Negative for headaches.       Objective:    Physical Exam Constitutional:      General: He is not in acute distress.    Appearance: Normal appearance. He is normal weight. He is not ill-appearing.  HENT:     Head: Normocephalic and atraumatic.     Right Ear: External ear normal.     Left Ear: External ear normal.     Nose: Nose normal.     Mouth/Throat:     Mouth: Mucous membranes are moist.     Pharynx: Oropharynx is clear.  Eyes:     General:        Right eye: No discharge.        Left eye: No discharge.  Extraocular Movements: Extraocular movements intact.     Conjunctiva/sclera: Conjunctivae normal.     Right eye: No hemorrhage.    Pupils: Pupils are equal, round, and reactive to light.     Comments: Right eye is slightly erythematic.   Cardiovascular:     Rate and Rhythm: Normal rate and regular rhythm.     Pulses: Normal pulses.     Heart sounds: Normal heart sounds. No murmur heard.    No gallop.  Pulmonary:     Effort: Pulmonary effort is normal. No respiratory distress.     Breath sounds: Normal breath sounds. No wheezing or rales.  Abdominal:     General: Bowel sounds are normal.     Palpations: Abdomen is soft.     Tenderness: There is no abdominal tenderness. There is no guarding.  Musculoskeletal:        General: Normal range of motion.     Cervical back: Normal range of motion.     Right lower leg: No edema.     Left lower leg: No edema.   Skin:    General: Skin is warm and dry.  Neurological:     Mental Status: He is alert and oriented to person, place, and time.  Psychiatric:        Mood and Affect: Mood normal.        Behavior: Behavior normal.        Judgment: Judgment normal.     BP 138/78 (BP Location: Right Arm, Patient Position: Sitting)   Pulse 100   Temp (!) 97.5 F (36.4 C) (Oral)   Resp 16   Ht '5\' 10"'$  (1.778 m)   Wt 179 lb 12.8 oz (81.6 kg)   SpO2 98%   BMI 25.80 kg/m  Wt Readings from Last 3 Encounters:  09/15/22 179 lb 12.8 oz (81.6 kg)  09/02/22 179 lb 3.2 oz (81.3 kg)  09/01/22 174 lb (78.9 kg)    Diabetic Foot Exam - Simple   No data filed    Lab Results  Component Value Date   WBC 10.2 09/02/2022   HGB 12.9 (L) 09/02/2022   HCT 38.0 (L) 09/02/2022   PLT 454.0 (H) 09/02/2022   GLUCOSE 353 (H) 09/02/2022   CHOL 171 03/15/2022   TRIG 104.0 03/15/2022   HDL 79.80 03/15/2022   LDLDIRECT 64.0 10/30/2017   LDLCALC 70 03/15/2022   ALT 19 09/02/2022   AST 20 09/02/2022   NA 129 (L) 09/02/2022   K 4.3 09/02/2022   CL 91 (L) 09/02/2022   CREATININE 0.65 09/02/2022   BUN 13 09/02/2022   CO2 28 09/02/2022   TSH 3.20 08/23/2022   PSA 0.65 10/08/2019   INR 3.7 09/12/2022   HGBA1C 10.4 (H) 08/23/2022   MICROALBUR 2.5 (H) 08/23/2022    Lab Results  Component Value Date   TSH 3.20 08/23/2022   Lab Results  Component Value Date   WBC 10.2 09/02/2022   HGB 12.9 (L) 09/02/2022   HCT 38.0 (L) 09/02/2022   MCV 87.7 09/02/2022   PLT 454.0 (H) 09/02/2022   Lab Results  Component Value Date   NA 129 (L) 09/02/2022   K 4.3 09/02/2022   CO2 28 09/02/2022   GLUCOSE 353 (H) 09/02/2022   BUN 13 09/02/2022   CREATININE 0.65 09/02/2022   BILITOT 0.8 09/02/2022   ALKPHOS 115 09/02/2022   AST 20 09/02/2022   ALT 19 09/02/2022   PROT 5.9 (L) 09/02/2022   ALBUMIN 3.8 09/02/2022   CALCIUM  8.8 09/02/2022   ANIONGAP 9 10/05/2021   EGFR 92 01/25/2022   GFR 89.93 09/02/2022   Lab  Results  Component Value Date   CHOL 171 03/15/2022   Lab Results  Component Value Date   HDL 79.80 03/15/2022   Lab Results  Component Value Date   LDLCALC 70 03/15/2022   Lab Results  Component Value Date   TRIG 104.0 03/15/2022   Lab Results  Component Value Date   CHOLHDL 2 03/15/2022   Lab Results  Component Value Date   HGBA1C 10.4 (H) 08/23/2022      Assessment & Plan:  Anxiety/Depression: Venlafaxine 150 mg to be taken in the evening.  Hyperglycemia: Lantus increased to 17 units daily. Increase by 2 units every 3 days if sugar remains elevated.  Right Eye Irritation: Recommended Fluticasone nasal spray and continued use of Refresh eye drops. Problem List Items Addressed This Visit     COPD (chronic obstructive pulmonary disease) ([place])    No recent exacerbation      Diabetes mellitus due to underlying condition with hyperosmolarity without coma, with long-term current use of insulin ([place])    Sugars have been running above 200 for past couple of weeks. He was ill with covid and is feeling much better but sugars still run high. He is encouraged to increase his Lantus to 17 units daily and if numbers remain above 200 and no numbers below 100 then increase by 2 unites every 3 days.       Relevant Medications   insulin glargine (LANTUS SOLOSTAR) 100 UNIT/ML Solostar Pen   Diabetes mellitus with hyperglycemia (HCC)    Sugars have been labile recently. No complaints of polyuria or polydipsia      Relevant Medications   insulin glargine (LANTUS SOLOSTAR) 100 UNIT/ML Solostar Pen   Essential hypertension - Primary    Well controlled, no changes to meds. Encouraged heart healthy diet such as the DASH diet and exercise as tolerated.        Mixed hyperlipidemia    Encourage heart healthy diet such as MIND or DASH diet, increase exercise, avoid trans fats, simple carbohydrates and processed foods, consider a krill or fish or flaxseed oil cap daily.        Paroxysmal  A-fib (HCC)   Meds ordered this encounter  Medications   insulin glargine (LANTUS SOLOSTAR) 100 UNIT/ML Solostar Pen    Sig: Inject 17 Units into the skin daily.    Dispense:  100 mL    Refill:  2   I, Penni Homans, MD, personally preformed the services described in this documentation.  All medical record entries made by the scribe were at my direction and in my presence.  I have reviewed the chart and discharge instructions (if applicable) and agree that the record reflects my personal performance and is accurate and complete. 09/15/2022  I,Mohammed Iqbal,acting as a scribe for Penni Homans, MD.,have documented all relevant documentation on the behalf of Penni Homans, MD,as directed by  Penni Homans, MD while in the presence of Penni Homans, MD.  Penni Homans, MD

## 2022-09-15 NOTE — Assessment & Plan Note (Signed)
Well controlled, no changes to meds. Encouraged heart healthy diet such as the DASH diet and exercise as tolerated.  °

## 2022-09-15 NOTE — Patient Instructions (Addendum)
Lantus 17 units and then can increase the lantus by 2 units every 3 days if sugars remain elevated do not drop below 100 once that happens stop increasing

## 2022-09-15 NOTE — Assessment & Plan Note (Signed)
No recent exacerbation 

## 2022-09-15 NOTE — Assessment & Plan Note (Signed)
Encourage heart healthy diet such as MIND or DASH diet, increase exercise, avoid trans fats, simple carbohydrates and processed foods, consider a krill or fish or flaxseed oil cap daily.  °

## 2022-09-15 NOTE — Assessment & Plan Note (Signed)
Sugars have been labile recently. No complaints of polyuria or polydipsia

## 2022-09-18 NOTE — Assessment & Plan Note (Signed)
Sugars have been running above 200 for past couple of weeks. He was ill with covid and is feeling much better but sugars still run high. He is encouraged to increase his Lantus to 17 units daily and if numbers remain above 200 and no numbers below 100 then increase by 2 unites every 3 days.

## 2022-09-22 ENCOUNTER — Encounter: Payer: Self-pay | Admitting: Internal Medicine

## 2022-09-22 DIAGNOSIS — R0602 Shortness of breath: Secondary | ICD-10-CM | POA: Diagnosis not present

## 2022-09-22 DIAGNOSIS — M961 Postlaminectomy syndrome, not elsewhere classified: Secondary | ICD-10-CM | POA: Diagnosis not present

## 2022-09-22 DIAGNOSIS — M4624 Osteomyelitis of vertebra, thoracic region: Secondary | ICD-10-CM | POA: Diagnosis not present

## 2022-09-22 DIAGNOSIS — M150 Primary generalized (osteo)arthritis: Secondary | ICD-10-CM | POA: Diagnosis not present

## 2022-09-22 DIAGNOSIS — J9601 Acute respiratory failure with hypoxia: Secondary | ICD-10-CM | POA: Diagnosis not present

## 2022-09-22 DIAGNOSIS — G894 Chronic pain syndrome: Secondary | ICD-10-CM | POA: Diagnosis not present

## 2022-09-23 ENCOUNTER — Encounter: Payer: Self-pay | Admitting: Family Medicine

## 2022-09-23 NOTE — Telephone Encounter (Signed)
Ofev can cause fatige but he is on so many meds that can also cause fatigue - such as backlfen, lyrica, etc Yes safe to cut ofev  back to 1 pill per day but is not efective at that dose   Plan  - stop for 1 week and see if it improves -> if so then he should '150mg'$  bid MWF and '150mg'$  Once daily Tue/Thu/Sat with rest on Sunday -> this make the average daily dose of '200mg'$  and can stil be efective  -     Current Outpatient Medications:    albuterol (PROVENTIL HFA) 108 (90 Base) MCG/ACT inhaler, TAKE 2 PUFFS BY MOUTH EVERY 6 HOURS AS NEEDED FOR WHEEZE OR SHORTNESS OF BREATH, Disp: 8 g, Rfl: 5   atorvastatin (LIPITOR) 10 MG tablet, TAKE 1 TABLET BY MOUTH EVERY DAY, Disp: 90 tablet, Rfl: 1   B-D UF III MINI PEN NEEDLES 31G X 5 MM MISC, USE WITH LANTUS, Disp: 100 each, Rfl: 0   Baclofen 5 MG TABS, Take 1 tablet by mouth 2 (two) times daily., Disp: 60 tablet, Rfl: 1   busPIRone (BUSPAR) 15 MG tablet, Take 1 tablet (15 mg total) by mouth 2 (two) times daily., Disp: 180 tablet, Rfl: 0   dextromethorphan-guaiFENesin (MUCINEX DM) 30-600 MG 12hr tablet, Take 1 tablet by mouth 2 (two) times daily., Disp: , Rfl:    famciclovir (FAMVIR) 500 MG tablet, TAKE 1 TABLET BY MOUTH THREE TIMES A DAY, Disp: 21 tablet, Rfl: 0   fluticasone (FLONASE) 50 MCG/ACT nasal spray, SPRAY 2 SPRAYS INTO EACH NOSTRIL EVERY DAY, Disp: 48 mL, Rfl: 1   Fluticasone-Umeclidin-Vilant (TRELEGY ELLIPTA) 200-62.5-25 MCG/ACT AEPB, Inhale 1 puff into the lungs daily., Disp: 60 each, Rfl: 5   glucose blood (CONTOUR NEXT TEST) test strip, Check blood sugar once daily, Disp: 100 each, Rfl: 12   insulin glargine (LANTUS SOLOSTAR) 100 UNIT/ML Solostar Pen, Inject 17 Units into the skin daily., Disp: 100 mL, Rfl: 2   JANUVIA 50 MG tablet, TAKE 1 TABLET BY MOUTH EVERY DAY, Disp: 30 tablet, Rfl: 3   metFORMIN (GLUCOPHAGE) 1000 MG tablet, TAKE 1 TABLET (1,000 MG TOTAL) BY MOUTH TWICE A DAY WITH FOOD, Disp: 180 tablet, Rfl: 0   Nintedanib (OFEV) 150  MG CAPS, Take 1 capsule (150 mg total) by mouth 2 (two) times daily., Disp: 180 capsule, Rfl: 1   oxyCODONE-acetaminophen (PERCOCET/ROXICET) 5-325 MG tablet, Take 1 tablet by mouth every 4 (four) hours as needed for pain- frequency change, Disp: 180 tablet, Rfl: 0   pantoprazole (PROTONIX) 40 MG tablet, TAKE 1 TABLET BY MOUTH TWICE A DAY BEFORE A MEAL, Disp: 180 tablet, Rfl: 1   predniSONE (DELTASONE) 10 MG tablet, TAKE 1 TABLET (10 MG TOTAL) BY MOUTH DAILY WITH BREAKFAST., Disp: 30 tablet, Rfl: 3   pregabalin (LYRICA) 75 MG capsule, TAKE 1 CAPSULE BY MOUTH TWICE A DAY (Patient taking differently: Take 75 mg by mouth 2 (two) times daily.), Disp: 60 capsule, Rfl: 3   tamsulosin (FLOMAX) 0.4 MG CAPS capsule, TAKE 2 CAPSULES BY MOUTH EVERY DAY, Disp: 180 capsule, Rfl: 3   tiZANidine (ZANAFLEX) 4 MG tablet, Take 1 and 1/2 tablets (6 mg total) by mouth every 4 hours., Disp: 180 tablet, Rfl: 2   triamcinolone cream (KENALOG) 0.1 %, Apply a small amount to affected area twice a day as needed, Disp: 30 g, Rfl: 1   venlafaxine XR (EFFEXOR-XR) 150 MG 24 hr capsule, TAKE 1 CAPSULE BY MOUTH DAILY WITH BREAKFAST., Disp: 90 capsule,  Rfl: 0   Vitamin D, Ergocalciferol, (DRISDOL) 1.25 MG (50000 UNIT) CAPS capsule, Take 1 capsule (50,000 Units total) by mouth every 7 (seven) days., Disp: 8 capsule, Rfl: 0   warfarin (COUMADIN) 5 MG tablet, TAKE 1 TO 2 TABLETS DAILY OR AS PRESCRIBED BY COUMADIN CLINIC, Disp: 130 tablet, Rfl: 1   zolpidem (AMBIEN) 10 MG tablet, Take 0.5-1 tablets (5-10 mg total) by mouth at bedtime as needed for sleep., Disp: 15 tablet, Rfl: 1

## 2022-09-25 MED ORDER — ZOLPIDEM TARTRATE 10 MG PO TABS: 5.0000 mg | ORAL_TABLET | Freq: Every evening | ORAL | 1 refills | Status: DC | PRN

## 2022-09-26 ENCOUNTER — Ambulatory Visit: Payer: Medicare Other | Attending: Internal Medicine | Admitting: *Deleted

## 2022-09-26 ENCOUNTER — Other Ambulatory Visit: Payer: Self-pay | Admitting: Medical

## 2022-09-26 DIAGNOSIS — I480 Paroxysmal atrial fibrillation: Secondary | ICD-10-CM

## 2022-09-26 LAB — POCT INR: POC INR: 2.8

## 2022-09-26 NOTE — Patient Instructions (Signed)
Description   Continue taking warfarin 1 tablet daily except 1.5 tablets on Tuesdays and Saturdays.  Stay consistent with greens each week. Recheck INR in 3 weeks.  [facility]# [phone]

## 2022-09-27 ENCOUNTER — Ambulatory Visit: Payer: Medicare Other | Admitting: Family Medicine

## 2022-09-29 ENCOUNTER — Telehealth: Payer: Self-pay | Admitting: Family Medicine

## 2022-09-29 NOTE — Telephone Encounter (Signed)
[place] to schedule their annual wellness visit. Appointment made for 10/05/2022.  Sherol Dade; Care Guide Ambulatory [facility] Direct Dial: [phone]

## 2022-10-10 ENCOUNTER — Encounter: Payer: Self-pay | Admitting: Family Medicine

## 2022-10-11 ENCOUNTER — Other Ambulatory Visit: Payer: Self-pay | Admitting: Family Medicine

## 2022-10-11 MED ORDER — ZOLPIDEM TARTRATE 10 MG PO TABS: 5.0000 mg | ORAL_TABLET | Freq: Every evening | ORAL | 1 refills | Status: DC | PRN

## 2022-10-12 DIAGNOSIS — M25511 Pain in right shoulder: Secondary | ICD-10-CM | POA: Diagnosis not present

## 2022-10-12 DIAGNOSIS — M25512 Pain in left shoulder: Secondary | ICD-10-CM | POA: Diagnosis not present

## 2022-10-17 ENCOUNTER — Ambulatory Visit: Payer: Medicare Other

## 2022-10-19 ENCOUNTER — Telehealth: Payer: Self-pay

## 2022-10-19 NOTE — Telephone Encounter (Signed)
Called pt lvm to let him know per Dr.Wendling  To keep appt with Percell Miller on tomorrow. Call our office  With question or concerns.

## 2022-10-19 NOTE — Telephone Encounter (Signed)
Done

## 2022-10-20 ENCOUNTER — Telehealth: Payer: Self-pay | Admitting: *Deleted

## 2022-10-20 ENCOUNTER — Ambulatory Visit (INDEPENDENT_AMBULATORY_CARE_PROVIDER_SITE_OTHER): Payer: Medicare Other | Admitting: Medical

## 2022-10-20 ENCOUNTER — Encounter: Payer: Self-pay | Admitting: Medical

## 2022-10-20 ENCOUNTER — Ambulatory Visit (HOSPITAL_BASED_OUTPATIENT_CLINIC_OR_DEPARTMENT_OTHER)
Admission: RE | Admit: 2022-10-20 | Discharge: 2022-10-20 | Disposition: A | Discharge status: Home / Self Care | Payer: Medicare Other | Source: Ambulatory Visit | Attending: Medical | Admitting: Medical

## 2022-10-20 ENCOUNTER — Ambulatory Visit: Payer: Medicare Other | Admitting: Orthopedic Surgery

## 2022-10-20 VITALS — BP 122/68 | HR 80 | Resp 18 | Ht 70.0 in | Wt 176.0 lb

## 2022-10-20 DIAGNOSIS — R059 Cough, unspecified: Secondary | ICD-10-CM | POA: Diagnosis not present

## 2022-10-20 DIAGNOSIS — E0865 Diabetes mellitus due to underlying condition with hyperglycemia: Secondary | ICD-10-CM | POA: Diagnosis not present

## 2022-10-20 DIAGNOSIS — E1165 Type 2 diabetes mellitus with hyperglycemia: Secondary | ICD-10-CM

## 2022-10-20 DIAGNOSIS — Z794 Long term (current) use of insulin: Secondary | ICD-10-CM | POA: Diagnosis not present

## 2022-10-20 DIAGNOSIS — J449 Chronic obstructive pulmonary disease, unspecified: Secondary | ICD-10-CM | POA: Diagnosis not present

## 2022-10-20 DIAGNOSIS — R6883 Chills (without fever): Secondary | ICD-10-CM

## 2022-10-20 DIAGNOSIS — R61 Generalized hyperhidrosis: Secondary | ICD-10-CM

## 2022-10-20 DIAGNOSIS — M4624 Osteomyelitis of vertebra, thoracic region: Secondary | ICD-10-CM | POA: Diagnosis not present

## 2022-10-20 DIAGNOSIS — J439 Emphysema, unspecified: Secondary | ICD-10-CM | POA: Diagnosis not present

## 2022-10-20 DIAGNOSIS — J8410 Pulmonary fibrosis, unspecified: Secondary | ICD-10-CM | POA: Diagnosis not present

## 2022-10-20 LAB — CBC WITH DIFFERENTIAL/PLATELET
Basophils Absolute: 0.1 10*3/uL (ref 0.0–0.1)
Basophils Relative: 0.5 % (ref 0.0–3.0)
Eosinophils Absolute: 0.1 10*3/uL (ref 0.0–0.7)
Eosinophils Relative: 0.4 % (ref 0.0–5.0)
HCT: 39.7 % (ref 39.0–52.0)
Hemoglobin: 13.4 g/dL (ref 13.0–17.0)
Lymphocytes Relative: 1.3 % — ABNORMAL LOW (ref 12.0–46.0)
Lymphs Abs: 0.2 10*3/uL — ABNORMAL LOW (ref 0.7–4.0)
MCHC: 33.8 g/dL (ref 30.0–36.0)
MCV: 88 fl (ref 78.0–100.0)
Monocytes Absolute: 1.4 10*3/uL — ABNORMAL HIGH (ref 0.1–1.0)
Monocytes Relative: 8.3 % (ref 3.0–12.0)
Neutro Abs: 14.7 10*3/uL — ABNORMAL HIGH (ref 1.4–7.7)
Neutrophils Relative %: 89.5 % — ABNORMAL HIGH (ref 43.0–77.0)
Platelets: 440 10*3/uL — ABNORMAL HIGH (ref 150.0–400.0)
RBC: 4.51 Mil/uL (ref 4.22–5.81)
RDW: 15.9 % — ABNORMAL HIGH (ref 11.5–15.5)
WBC: 16.4 10*3/uL — ABNORMAL HIGH (ref 4.0–10.5)

## 2022-10-20 LAB — GLUCOSE, POCT (MANUAL RESULT ENTRY): POC Glucose: 327 mg/dl — AB (ref 70–99)

## 2022-10-20 LAB — COMPREHENSIVE METABOLIC PANEL
ALT: 11 U/L (ref 0–53)
AST: 16 U/L (ref 0–37)
Albumin: 3.9 g/dL (ref 3.5–5.2)
Alkaline Phosphatase: 115 U/L (ref 39–117)
BUN: 19 mg/dL (ref 6–23)
CO2: 27 mEq/L (ref 19–32)
Calcium: 9.3 mg/dL (ref 8.4–10.5)
Chloride: 89 mEq/L — ABNORMAL LOW (ref 96–112)
Creatinine, Ser: 0.67 mg/dL (ref 0.40–1.50)
GFR: 89.02 mL/min (ref >60.00)
Glucose, Bld: 381 mg/dL — ABNORMAL HIGH (ref 70–99)
Potassium: 4.7 mEq/L (ref 3.5–5.1)
Sodium: 126 mEq/L — ABNORMAL LOW (ref 135–145)
Total Bilirubin: 1.6 mg/dL — ABNORMAL HIGH (ref 0.2–1.2)
Total Protein: 6.6 g/dL (ref 6.0–8.3)

## 2022-10-20 LAB — SEDIMENTATION RATE: Sed Rate: 19 mm/hr (ref 0–20)

## 2022-10-20 LAB — C-REACTIVE PROTEIN: CRP: 22.1 mg/dL — ABNORMAL HIGH (ref 0.5–20.0)

## 2022-10-20 MED ORDER — DOXYCYCLINE HYCLATE 100 MG PO TABS: 100.0000 mg | ORAL_TABLET | Freq: Two times a day (BID) | ORAL | 0 refills | Status: DC

## 2022-10-20 MED ORDER — ALBUTEROL SULFATE HFA 108 (90 BASE) MCG/ACT IN AERS: 2.0000 | INHALATION_SPRAY | Freq: Four times a day (QID) | RESPIRATORY_TRACT | 0 refills | Status: DC | PRN

## 2022-10-20 NOTE — Telephone Encounter (Signed)
Wife called and stated that the pt needed to have INR appt. After brief discussion, she stated pt has not been feeling well and went to the doctor today. Per chart pt has been pt on doxycycline 100mg  bid and will start tomorrow. Advised it will interact with warfarin and cause the INR to increase. Also, she asked what INR means that she was told the pt needed and explained international normal ratio which is basically how thick or thin his warfarin level is while on warfarin.  She states she had been letting him have premier protein drinks which have vitamin k and can assist in decreasing INR. Advised to keep consistent since he is not able to eat much and that we will need to check INR on Monday and adjust as needed. Confirmed appt and she was thankful for the assistance.

## 2022-10-20 NOTE — Addendum Note (Signed)
Addended by: Anabel Halon on: 10/20/2022 01:55 PM   Modules accepted: Orders

## 2022-10-20 NOTE — Patient Instructions (Addendum)
1. Diabetes mellitus with hyperglycemia. Advise follow pcp advise as explained on last visit. Titrate up on lantus insulin by 2 units daily but stop titration if you get sugar level less than 100. Will see if possible infection contributing to increase. Recent steroid injections to both shoulders by orthopedist. This may be playing role in high sugars.  - POCT glucose (manual entry) - Comp Met (CMET)  2. Cough, unspecified type Will follow labs and chest xray. May decide on giving antibiotic after lab and xray review - CBC w/Diff - DG Chest 2 View; Future  3. Subacute osteomyelitis of thoracic spine  With history of thoracic spine infection in past due think worthwhile to do below labs. - Sedimentation rate - C-reactive protein - Lactic acid, plasma  4. Chills and Night sweats past 3 nights - Sedimentation rate - C-reactive protein - Lactic acid, plasma   Ask you reach out to your pulmonlogist office to get advise on ofev.  Follow up date to be determined after lab review. I think at least in one week or sooner if needed

## 2022-10-20 NOTE — Progress Notes (Addendum)
Subjective:    [name], male    DOB: [birth year], 77 y.o.   MRN: [id number]  HPI  Pt in for follow up.  Pt is diabetic. He is having high sugars over 300 consistently over past week.   Pcp last pland for diabetes. " Diabetes mellitus due to underlying condition with hyperosmolarity without coma, with long-term current use of insulin (HCC)       Sugars have been running above 200 for past couple of weeks. He was ill with covid and is feeling much better but sugars still run high. He is encouraged to increase his Lantus to 17 units daily and if numbers remain above 200 and no numbers below 100 then increase by 2 unites every 3 days.    "  Pt also has pulmonary fibrosis and copd. Pt attribute increase of baseline of fatigue to ofeb. Currenlty he states instructed to take just 1 tab every other day.   Some sweats, chills last 3 nights. Hx of thoracic osteomyeolitis in past. no  Review of Systems  Constitutional:  Positive for chills, diaphoresis and fatigue.       Pt is very frustrated with how he feels.   Respiratory:  Negative for cough, chest tightness, shortness of breath and wheezing.        Hx of pulmonary fibrosis.  Cardiovascular:  Negative for chest pain and palpitations.  Gastrointestinal:  Negative for abdominal pain, blood in stool and constipation.  Musculoskeletal:  Positive for back pain.       Chronic t spine pain.  Neurological:  Negative for dizziness, syncope, speech difficulty, weakness, numbness and headaches.  Hematological:  Negative for adenopathy. Does not bruise/bleed easily.  Psychiatric/Behavioral:  Negative for behavioral problems, confusion, sleep disturbance and suicidal ideas. The patient is nervous/anxious.        Frustatrated about health.   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   Esophageal reflux 02/10/2015   Fatigue    HTN (hypertension)    Hyperglycemia    preDM    Hyperlipidemia    Insomnia    Low back pain radiating to both legs 01/16/2017   Measles as a child   Overweight(278.02)    Peripheral neuropathy 08/07/2019   Personal history of colonic polyps 10/27/2012   Follows [facility]   Pneumonia    " walking"   Preventative health care    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/23/2012   Wears glasses      Social History   Socioeconomic History   Marital status: Married    Spouse name: Not on file   Number of children: Not on file   Years of education: Not on file   Highest education level: 12th grade  Occupational History   Not on file  Tobacco Use   Smoking status: Former    Packs/day: 1.00    Years: 20.00    Additional pack years: 0.00    Total pack years: 20.00  Types: Cigarettes    Quit date: 07/18/1989    Years since quitting: 33.2   Smokeless tobacco: Never   Tobacco comments:    Former smoker 03/16/22  Vaping Use   Vaping Use: Never used  Substance and Sexual Activity   Alcohol use: Yes    Alcohol/week: 2.0 standard drinks of alcohol    Types: 2 Standard drinks or equivalent per week    Comment: couple beers here and there 03/16/22   Drug use: No   Sexual activity: Yes    Comment: lives with wife, still working, no dietary restrictions, continues to exercise intermittently  Other Topics Concern   Not on file  Social History Narrative   Not on file   Social Determinants of Health   Financial Resource Strain: Low Risk  (10/20/2022)   Overall Financial Resource Strain (CARDIA)    Difficulty of Paying Living Expenses: Not  hard at all  Food Insecurity: No Food Insecurity (10/20/2022)   Hunger Vital Sign    Worried About Running Out of Food in the Last Year: Never true    Ran Out of Food in the Last Year: Never true  Transportation Needs: No Transportation Needs (10/20/2022)   PRAPARE - Administrator, Civil Service (Medical): No    Lack of Transportation (Non-Medical): No  Physical Activity: Unknown (10/20/2022)   Exercise Vital Sign    Days of Exercise per Week: 0 days    Minutes of Exercise per Session: Not on file  Stress: Stress Concern Present (10/20/2022)   [employer] of Occupational Health - Occupational Stress Questionnaire    Feeling of Stress : To some extent  Social Connections: Socially Integrated (10/20/2022)   Social Connection and Isolation Panel [NHANES]    Frequency of Communication with Friends and Family: Three times a week    Frequency of Social Gatherings with Friends and Family: Not on file    Attends Religious Services: More than 4 times per year    Active Member of [employer] or Organizations: Yes    Attends Banker Meetings: More than 4 times per year    Marital Status: Married  Catering manager Violence: Not At Risk (01/27/2021)   Humiliation, Afraid, Rape, and Kick questionnaire    Fear of Current or Ex-Partner: No    Emotionally Abused: No    Physically Abused: No    Sexually Abused: No    Past Surgical History:  Procedure Laterality Date   ATRIAL FIBRILLATION ABLATION N/A 01/27/2022   Procedure: ATRIAL FIBRILLATION ABLATION;  Surgeon: Regan Lemming, MD;  Location: [facility];  Service: Cardiovascular;  Laterality: N/A;   BACK SURGERY  2012 and 1994   Dr Charlesetta Garibaldi, screws and cage in low back   BIOPSY  01/09/2020   Procedure: BIOPSY;  Surgeon: Kathi Der, MD;  Location: [facility];  Service: Gastroenterology;;   BRONCHIAL BIOPSY  11/17/2020   Procedure: BRONCHIAL BIOPSIES;  Surgeon: Tomma Lightning, MD;  Location: [facility];   Service: Endoscopy;;   BRONCHIAL BRUSHINGS  11/17/2020   Procedure: BRONCHIAL BRUSHINGS;  Surgeon: Tomma Lightning, MD;  Location: [facility];  Service: Endoscopy;;   BRONCHIAL WASHINGS  11/17/2020   Procedure: BRONCHIAL WASHINGS;  Surgeon: Tomma Lightning, MD;  Location: [facility];  Service: Endoscopy;;   COLONOSCOPY WITH PROPOFOL N/A 01/09/2020   Procedure: COLONOSCOPY WITH PROPOFOL;  Surgeon: Kathi Der, MD;  Location: [facility];  Service: Gastroenterology;  Laterality: N/A;   ESOPHAGOGASTRODUODENOSCOPY (EGD) WITH PROPOFOL N/A 01/09/2020  Procedure: ESOPHAGOGASTRODUODENOSCOPY (EGD) WITH PROPOFOL;  Surgeon: Kathi Der, MD;  Location: [facility];  Service: Gastroenterology;  Laterality: N/A;   EYE SURGERY Bilateral    2016   HARDWARE REVISION  03/12/2019   REVISION OF HARDWARE THORACIC TEN-THORACIC ELEVEN WITH APPLICATION OF ADDITIONAL RODS AND ROD SLEEVES THORACIC EIGHT-THORACIC TWELVE 12-LUMBAR ONE (N/A )   HEMOSTASIS CONTROL  11/17/2020   Procedure: HEMOSTASIS CONTROL;  Surgeon: Tomma Lightning, MD;  Location: [facility];  Service: Endoscopy;;   IR US GUIDE BX ASP/DRAIN  11/13/2020   LAMINECTOMY  02/12/2019   DECOMPRESSIVE LAMINECTOMY THORACIC NINE-THORACIC TEN AND THORACIC TEN-THORACIC ELEVEN, EXTENSION OF THORACOLUMBAR FUSION FROM THORACIC TEN TO THORACIC FIVE, LOCAL BONE GRAFT, ALLOGRAFT AND VIVIGEN (N/A)   POLYPECTOMY  01/09/2020   Procedure: POLYPECTOMY;  Surgeon: Kathi Der, MD;  Location: [facility];  Service: Gastroenterology;;   REMOVAL OF RODS AND PEDICLE SCREWS T5 AND T6, EXPLORATION OF FUSION, BIOPSY TRANSPEDICULAR T5 AND T6 (N/A )  09/14/2020   SPINAL FUSION N/A 02/12/2019   Procedure: DECOMPRESSIVE LAMINECTOMY THORACIC NINE-THORACIC TEN AND THORACIC TEN-THORACIC ELEVEN, EXTENSION OF THORACOLUMBAR FUSION FROM THORACIC TEN TO THORACIC FIVE, LOCAL BONE GRAFT, ALLOGRAFT AND VIVIGEN;  Surgeon: Kerrin Champagne, MD;  Location: [facility];  Service:  Orthopedics;  Laterality: N/A;  DECOMPRESSIVE LAMINECTOMY THORACIC NINE-THORACIC TEN AND THORACIC TEN-THORACIC ELEVEN, EXTENSION OF TH   SPINAL FUSION N/A 03/12/2019   Procedure: REVISION OF HARDWARE THORACIC TEN-THORACIC ELEVEN WITH APPLICATION OF ADDITIONAL RODS AND ROD SLEEVES THORACIC EIGHT-THORACIC TWELVE 12-LUMBAR ONE;  Surgeon: Kerrin Champagne, MD;  Location: [facility];  Service: Orthopedics;  Laterality: N/A;   TONSILLECTOMY     as child   torn rotator cuff  2010   right   VIDEO BRONCHOSCOPY N/A 11/17/2020   Procedure: VIDEO BRONCHOSCOPY WITH FLUORO;  Surgeon: Tomma Lightning, MD;  Location: [facility];  Service: Endoscopy;  Laterality: N/A;    Family History  Problem Relation Age of Onset   Hypertension Mother    Diabetes Mother        type 2   Cancer Mother 59       breast in remission   Emphysema Father        smoker   COPD Father        smoker   Stroke Father 30       mini   Heart disease Father    Hypertension Sister    Hyperlipidemia Sister    Scoliosis Sister    Osteoporosis Sister    Hypertension Maternal Grandmother    Scoliosis Maternal Grandmother    Heart disease Maternal Grandfather    Heart disease Paternal Grandfather        smoker   Heart disease Daughter     Allergies  Allergen Reactions   Daptomycin     Drug induced pneumonia    Diltiazem Other (See Comments)    Passed out    Current Outpatient Medications on File Prior to Visit  Medication Sig Dispense Refill   albuterol (PROVENTIL HFA) 108 (90 Base) MCG/ACT inhaler TAKE 2 PUFFS BY MOUTH EVERY 6 HOURS AS NEEDED FOR WHEEZE OR SHORTNESS OF BREATH 8 g 5   atorvastatin (LIPITOR) 10 MG tablet TAKE 1 TABLET BY MOUTH EVERY DAY 90 tablet 1   Baclofen 5 MG TABS Take 1 tablet by mouth 2 (two) times daily. 60 tablet 1   busPIRone (BUSPAR) 15 MG tablet Take 1 tablet (15 mg total) by mouth 2 (two) times daily. 180 tablet 0  dextromethorphan-guaiFENesin (MUCINEX DM) 30-600 MG 12hr tablet Take 1 tablet by  mouth 2 (two) times daily.     famciclovir (FAMVIR) 500 MG tablet TAKE 1 TABLET BY MOUTH THREE TIMES A DAY 21 tablet 0   fluticasone (FLONASE) 50 MCG/ACT nasal spray SPRAY 2 SPRAYS INTO EACH NOSTRIL EVERY DAY 48 mL 1   Fluticasone-Umeclidin-Vilant (TRELEGY ELLIPTA) 200-62.5-25 MCG/ACT AEPB Inhale 1 puff into the lungs daily. 60 each 5   glucose blood (CONTOUR NEXT TEST) test strip Check blood sugar once daily 100 each 12   insulin glargine (LANTUS SOLOSTAR) 100 UNIT/ML Solostar Pen Inject 17 Units into the skin daily. 100 mL 2   Insulin Pen Needle (B-D UF III MINI PEN NEEDLES) 31G X 5 MM MISC USE WITH LANTUS 100 each 3   JANUVIA 50 MG tablet TAKE 1 TABLET BY MOUTH EVERY DAY 30 tablet 3   metFORMIN (GLUCOPHAGE) 1000 MG tablet TAKE 1 TABLET (1,000 MG TOTAL) BY MOUTH TWICE A DAY WITH FOOD 180 tablet 0   Nintedanib (OFEV) 150 MG CAPS Take 1 capsule (150 mg total) by mouth 2 (two) times daily. 180 capsule 1   oxyCODONE-acetaminophen (PERCOCET/ROXICET) 5-325 MG tablet Take 1 tablet by mouth every 4 (four) hours as needed for pain- frequency change 180 tablet 0   pantoprazole (PROTONIX) 40 MG tablet TAKE 1 TABLET BY MOUTH TWICE A DAY BEFORE A MEAL 180 tablet 1   predniSONE (DELTASONE) 10 MG tablet TAKE 1 TABLET (10 MG TOTAL) BY MOUTH DAILY WITH BREAKFAST. 30 tablet 3   pregabalin (LYRICA) 75 MG capsule TAKE 1 CAPSULE BY MOUTH TWICE A DAY (Patient taking differently: Take 75 mg by mouth 2 (two) times daily.) 60 capsule 3   tamsulosin (FLOMAX) 0.4 MG CAPS capsule TAKE 2 CAPSULES BY MOUTH EVERY DAY 180 capsule 3   tiZANidine (ZANAFLEX) 4 MG tablet Take 1 and 1/2 tablets (6 mg total) by mouth every 4 hours. 180 tablet 2   triamcinolone cream (KENALOG) 0.1 % Apply a small amount to affected area twice a day as needed 30 g 1   venlafaxine XR (EFFEXOR-XR) 150 MG 24 hr capsule TAKE 1 CAPSULE BY MOUTH DAILY WITH BREAKFAST. 90 capsule 0   Vitamin D, Ergocalciferol, (DRISDOL) 1.25 MG (50000 UNIT) CAPS capsule Take 1  capsule (50,000 Units total) by mouth every 7 (seven) days. 8 capsule 0   warfarin (COUMADIN) 5 MG tablet TAKE 1 TO 2 TABLETS DAILY OR AS PRESCRIBED BY COUMADIN CLINIC 130 tablet 1   zolpidem (AMBIEN) 10 MG tablet Take 0.5-1 tablets (5-10 mg total) by mouth at bedtime as needed for sleep. 15 tablet 1   No current facility-administered medications on file prior to visit.    BP 122/68   Pulse 80   Resp 18   Ht 5\' 10"  (1.778 m)   Wt 176 lb (79.8 kg)   SpO2 94%   BMI 25.25 kg/m    02 sat varies in past. 94% in his historical range.      Objective:   Physical Exam  General Mental Status- Alert. General Appearance- Not in acute distress.   Skin General: Color- Normal Color. Moisture- Normal Moisture.  Neck Carotid Arteries- Normal color. Moisture- Normal Moisture. No carotid bruits. No JVD.  Chest and Lung Exam Auscultation: Breath Sounds:-Normal.  Cardiovascular Even unlabored but shallow and expiratory wheeze bilaterally Abdomen Inspection:-Inspeection Normal. Palpation/Percussion:Note:No mass. Palpation and Percussion of the abdomen reveal- Non Tender, Non Distended + BS, no rebound or guarding.  Neurologic Cranial Nerve exam:-  CN III-XII intact(No nystagmus), symmetric smile. Strength:- 5/5 equal and symmetric strength both upper and lower extremities.       Assessment & Plan:   Patient Instructions  1. Diabetes mellitus with hyperglycemia. Advise follow pcp advise as explained on last visit. Titrate up on lantus insulin by 2 units daily but stop titration if you get sugar level less than 100. Will see if possible infection contributing to increase. Recent steroid injections to both shoulders by orthopedist. This may be playing role in high sugars.  - POCT glucose (manual entry) - Comp Met (CMET)  2. Cough, unspecified type Will follow labs and chest xray. May decide on giving antibiotic after lab and xray review - CBC w/Diff - DG Chest 2 View; Future  3.  Subacute osteomyelitis of thoracic spine  With history of thoracic spine infection in past due think worthwhile to do below labs. - Sedimentation rate - C-reactive protein - Lactic acid, plasma  4. Chills and Night sweats past 3 nights - Sedimentation rate - C-reactive protein - Lactic acid, plasma   Ask you reach out to your pulmonlogist office to get advise on ofev.  Follow up date to be determined after lab review. I think at least in one week or sooner if needed   Esperanza Richters, PA-C

## 2022-10-21 ENCOUNTER — Telehealth: Payer: Self-pay | Admitting: Family Medicine

## 2022-10-21 LAB — LACTIC ACID, PLASMA: LACTIC ACID: 2.2 mmol/L — ABNORMAL HIGH (ref 0.4–1.8)

## 2022-10-21 NOTE — Telephone Encounter (Signed)
Copied from CRM #[id number]. Topic: Medicare AWV >> Oct 21, 2022 11:11 AM Karigan Cloninger H wrote: Reason for CRM: Called patient to schedule Medicare Annual Wellness Visit (AWV). Left message for patient to call back and schedule Medicare Annual Wellness Visit (AWV).  Last date of AWV: NONE  Please schedule an appointment at any time with Amber Bender, CMA  .  If any questions, please contact me.  Thank you ,  Nechuma Boven Harris-Coley; Care Guide Ambulatory Clinical Support [place] [facility] Direct Dial: [phone] 

## 2022-10-21 NOTE — Telephone Encounter (Signed)
Copied from CRM [id number]. Topic: Medicare AWV >> Oct 21, 2022 11:11 AM Payton Doughty wrote: Reason for CRM: Called patient to schedule Medicare Annual Wellness Visit (AWV). Left message for patient to call back and schedule Medicare Annual Wellness Visit (AWV).  Last date of AWV: NONE  Please schedule an appointment at any time with Donne Anon, CMA  .  If any questions, please contact me.  Thank you ,  Verlee Rossetti; Care Guide Ambulatory Clinical Support [place] [facility] Direct Dial: [phone]

## 2022-10-22 ENCOUNTER — Encounter: Payer: Self-pay | Admitting: Medical

## 2022-10-23 ENCOUNTER — Encounter: Payer: Self-pay | Admitting: Medical

## 2022-10-23 ENCOUNTER — Encounter: Payer: Self-pay | Admitting: Family Medicine

## 2022-10-23 ENCOUNTER — Encounter (HOSPITAL_BASED_OUTPATIENT_CLINIC_OR_DEPARTMENT_OTHER): Payer: Self-pay | Admitting: Emergency Medicine

## 2022-10-23 ENCOUNTER — Emergency Department (HOSPITAL_BASED_OUTPATIENT_CLINIC_OR_DEPARTMENT_OTHER)
Admission: EM | Admit: 2022-10-23 | Discharge: 2022-10-23 | Disposition: A | Discharge status: Home / Self Care | Payer: Medicare Other | Attending: Emergency Medicine | Admitting: Emergency Medicine

## 2022-10-23 DIAGNOSIS — R0602 Shortness of breath: Secondary | ICD-10-CM | POA: Diagnosis not present

## 2022-10-23 DIAGNOSIS — Z794 Long term (current) use of insulin: Secondary | ICD-10-CM | POA: Insufficient documentation

## 2022-10-23 DIAGNOSIS — Z7901 Long term (current) use of anticoagulants: Secondary | ICD-10-CM | POA: Diagnosis not present

## 2022-10-23 DIAGNOSIS — Z7984 Long term (current) use of oral hypoglycemic drugs: Secondary | ICD-10-CM | POA: Insufficient documentation

## 2022-10-23 DIAGNOSIS — R8289 Other abnormal findings on cytological and histological examination of urine: Secondary | ICD-10-CM | POA: Diagnosis not present

## 2022-10-23 DIAGNOSIS — J9601 Acute respiratory failure with hypoxia: Secondary | ICD-10-CM | POA: Diagnosis not present

## 2022-10-23 DIAGNOSIS — R000 Tachycardia, unspecified: Secondary | ICD-10-CM | POA: Diagnosis not present

## 2022-10-23 DIAGNOSIS — R899 Unspecified abnormal finding in specimens from other organs, systems and tissues: Secondary | ICD-10-CM

## 2022-10-23 DIAGNOSIS — R7989 Other specified abnormal findings of blood chemistry: Secondary | ICD-10-CM | POA: Diagnosis not present

## 2022-10-23 LAB — COMPREHENSIVE METABOLIC PANEL
ALT: 22 U/L (ref 0–44)
AST: 30 U/L (ref 15–41)
Albumin: 3.4 g/dL — ABNORMAL LOW (ref 3.5–5.0)
Alkaline Phosphatase: 103 U/L (ref 38–126)
Anion gap: 10 (ref 5–15)
BUN: 15 mg/dL (ref 8–23)
CO2: 23 mmol/L (ref 22–32)
Calcium: 8.1 mg/dL — ABNORMAL LOW (ref 8.9–10.3)
Chloride: 90 mmol/L — ABNORMAL LOW (ref 98–111)
Creatinine, Ser: 0.71 mg/dL (ref 0.61–1.24)
GFR, Estimated: >60 mL/min (ref >60)
Glucose, Bld: 579 mg/dL (ref 70–99)
Potassium: 4.3 mmol/L (ref 3.5–5.1)
Sodium: 123 mmol/L — ABNORMAL LOW (ref 135–145)
Total Bilirubin: 0.7 mg/dL (ref 0.3–1.2)
Total Protein: 6.5 g/dL (ref 6.5–8.1)

## 2022-10-23 LAB — URINALYSIS, ROUTINE W REFLEX MICROSCOPIC
Bilirubin Urine: NEGATIVE
Glucose, UA: >=500 mg/dL — AB
Ketones, ur: NEGATIVE mg/dL
Leukocytes,Ua: NEGATIVE
Nitrite: NEGATIVE
Protein, ur: NEGATIVE mg/dL
Specific Gravity, Urine: 1.015 (ref 1.005–1.030)
pH: 7 (ref 5.0–8.0)

## 2022-10-23 LAB — CBC WITH DIFFERENTIAL/PLATELET
Abs Immature Granulocytes: 0.09 10*3/uL — ABNORMAL HIGH (ref 0.00–0.07)
Basophils Absolute: 0 10*3/uL (ref 0.0–0.1)
Basophils Relative: 0 %
Eosinophils Absolute: 0 10*3/uL (ref 0.0–0.5)
Eosinophils Relative: 0 %
HCT: 37.8 % — ABNORMAL LOW (ref 39.0–52.0)
Hemoglobin: 12.7 g/dL — ABNORMAL LOW (ref 13.0–17.0)
Immature Granulocytes: 1 %
Lymphocytes Relative: 2 %
Lymphs Abs: 0.2 10*3/uL — ABNORMAL LOW (ref 0.7–4.0)
MCH: 29.4 pg (ref 26.0–34.0)
MCHC: 33.6 g/dL (ref 30.0–36.0)
MCV: 87.5 fL (ref 80.0–100.0)
Monocytes Absolute: 0.6 10*3/uL (ref 0.1–1.0)
Monocytes Relative: 6 %
Neutro Abs: 8.7 10*3/uL — ABNORMAL HIGH (ref 1.7–7.7)
Neutrophils Relative %: 91 %
Platelets: 430 10*3/uL — ABNORMAL HIGH (ref 150–400)
RBC: 4.32 MIL/uL (ref 4.22–5.81)
RDW: 14.4 % (ref 11.5–15.5)
WBC: 9.7 10*3/uL (ref 4.0–10.5)
nRBC: 0 % (ref 0.0–0.2)

## 2022-10-23 LAB — LACTIC ACID, PLASMA: Lactic Acid, Venous: 1.6 mmol/L (ref 0.5–1.9)

## 2022-10-23 LAB — URINALYSIS, MICROSCOPIC (REFLEX)

## 2022-10-23 NOTE — Discharge Instructions (Signed)
Your labs are normal here.  Please enjoy the rest of your weekend

## 2022-10-23 NOTE — ED Provider Notes (Signed)
[place] EMERGENCY DEPARTMENT AT [facility] Provider Note   CSN: [id number] Arrival date & time: 10/23/22  1249     History  Chief Complaint  Patient presents with   Abnormal Labs    Luis Brown is a 77 y.o. male who presents emergency department with chief complaint of abnormal labs.  Patient was seen by his primary care doctor this week after having bilateral shoulder cortisone injections.  Patient states his sugar got high he got weak he was not feeling good.  He saw his PCP and was told that his lactic acid and white blood cell count were high.  He is prophylactically started on doxycycline and then told he needed to follow-up today for repeat labs.  Patient has no complaints.  He is not feeling ill.  HPI     Home Medications Prior to Admission medications   Medication Sig Start Date End Date Taking? Authorizing Provider  albuterol (PROVENTIL HFA) 108 (90 Base) MCG/ACT inhaler TAKE 2 PUFFS BY MOUTH EVERY 6 HOURS AS NEEDED FOR WHEEZE OR SHORTNESS OF BREATH 06/01/22   Kalman Shan, MD  albuterol (VENTOLIN HFA) 108 (90 Base) MCG/ACT inhaler Inhale 2 puffs into the lungs every 6 (six) hours as needed. 10/20/22   Saguier, Ramon Dredge, PA-C  atorvastatin (LIPITOR) 10 MG tablet TAKE 1 TABLET BY MOUTH EVERY DAY 08/16/22   Bradd Canary, MD  Baclofen 5 MG TABS Take 1 tablet by mouth 2 (two) times daily. 07/19/22     busPIRone (BUSPAR) 15 MG tablet Take 1 tablet (15 mg total) by mouth 2 (two) times daily. 06/30/22   Bradd Canary, MD  dextromethorphan-guaiFENesin [facility] DM) 30-600 MG 12hr tablet Take 1 tablet by mouth 2 (two) times daily.    [provider]  doxycycline (VIBRA-TABS) 100 MG tablet Take 1 tablet (100 mg total) by mouth 2 (two) times daily. 10/20/22   Saguier, Ramon Dredge, PA-C  famciclovir (FAMVIR) 500 MG tablet TAKE 1 TABLET BY MOUTH THREE TIMES A DAY 07/25/22   Saguier, Ramon Dredge, PA-C  fluticasone [facility]) 50 MCG/ACT nasal spray SPRAY 2 SPRAYS INTO EACH  NOSTRIL EVERY DAY 05/02/22   Saguier, Ramon Dredge, PA-C  Fluticasone-Umeclidin-Vilant (TRELEGY ELLIPTA) 200-62.5-25 MCG/ACT AEPB Inhale 1 puff into the lungs daily. 08/01/22   Kalman Shan, MD  glucose blood (CONTOUR NEXT TEST) test strip Check blood sugar once daily 02/25/19   Saguier, Ramon Dredge, PA-C  insulin glargine (LANTUS SOLOSTAR) 100 UNIT/ML Solostar Pen Inject 17 Units into the skin daily. 09/15/22   Bradd Canary, MD  Insulin Pen Needle (B-D UF III MINI PEN NEEDLES) 31G X 5 MM MISC USE WITH LANTUS 09/26/22   Bradd Canary, MD  JANUVIA 50 MG tablet TAKE 1 TABLET BY MOUTH EVERY DAY 07/07/22   Saguier, Ramon Dredge, PA-C  metFORMIN (GLUCOPHAGE) 1000 MG tablet TAKE 1 TABLET (1,000 MG TOTAL) BY MOUTH TWICE A DAY WITH FOOD 10/18/21   Saguier, Ramon Dredge, PA-C  Nintedanib (OFEV) 150 MG CAPS Take 1 capsule (150 mg total) by mouth 2 (two) times daily. 08/19/22   Kalman Shan, MD  oxyCODONE-acetaminophen (PERCOCET/ROXICET) 5-325 MG tablet Take 1 tablet by mouth every 4 (four) hours as needed for pain- frequency change 08/17/22     pantoprazole (PROTONIX) 40 MG tablet TAKE 1 TABLET BY MOUTH TWICE A DAY BEFORE A MEAL 11/30/21   Saguier, Ramon Dredge, PA-C  predniSONE (DELTASONE) 10 MG tablet TAKE 1 TABLET (10 MG TOTAL) BY MOUTH DAILY WITH BREAKFAST. 07/20/22   Kalman Shan, MD  pregabalin (LYRICA)  75 MG capsule TAKE 1 CAPSULE BY MOUTH TWICE A DAY Patient taking differently: Take 75 mg by mouth 2 (two) times daily. 03/09/20   Kerrin ChampagneNitka, James E, MD  tamsulosin (FLOMAX) 0.4 MG CAPS capsule TAKE 2 CAPSULES BY MOUTH EVERY DAY 06/06/22   Saguier, Ramon DredgeEdward, PA-C  tiZANidine (ZANAFLEX) 4 MG tablet Take 1 and 1/2 tablets (6 mg total) by mouth every 4 hours. 08/11/22     triamcinolone cream (KENALOG) 0.1 % Apply a small amount to affected area twice a day as needed 08/11/22     venlafaxine XR (EFFEXOR-XR) 150 MG 24 hr capsule TAKE 1 CAPSULE BY MOUTH DAILY WITH BREAKFAST. 06/27/22   Bradd CanaryBlyth, Stacey A, MD  Vitamin D, Ergocalciferol,  (DRISDOL) 1.25 MG (50000 UNIT) CAPS capsule Take 1 capsule (50,000 Units total) by mouth every 7 (seven) days. 09/05/22   Clayborne DanaBeck, Taylor B, NP  warfarin (COUMADIN) 5 MG tablet TAKE 1 TO 2 TABLETS DAILY OR AS PRESCRIBED BY COUMADIN CLINIC 04/27/22   Camnitz, Andree CossWill Martin, MD  zolpidem (AMBIEN) 10 MG tablet Take 0.5-1 tablets (5-10 mg total) by mouth at bedtime as needed for sleep. 10/11/22 11/10/22  Bradd CanaryBlyth, Stacey A, MD      Allergies    Daptomycin and Diltiazem    Review of Systems   Review of Systems  Physical Exam Updated Vital Signs BP 129/77   Pulse (!) 112   Temp 97.6 F (36.4 C)   Resp 20   SpO2 98%  Physical Exam Vitals and nursing note reviewed.  Constitutional:      General: He is not in acute distress.    Appearance: He is well-developed. He is not diaphoretic.  HENT:     Head: Normocephalic and atraumatic.  Eyes:     General: No scleral icterus.    Conjunctiva/sclera: Conjunctivae normal.  Cardiovascular:     Rate and Rhythm: Normal rate and regular rhythm.     Heart sounds: Normal heart sounds.  Pulmonary:     Effort: Pulmonary effort is normal. No respiratory distress.     Breath sounds: Normal breath sounds.  Abdominal:     Palpations: Abdomen is soft.     Tenderness: There is no abdominal tenderness.  Musculoskeletal:     Cervical back: Normal range of motion and neck supple.  Skin:    General: Skin is warm and dry.  Neurological:     Mental Status: He is alert.  Psychiatric:        Behavior: Behavior normal.     ED Results / Procedures / Treatments   Labs (all labs ordered are listed, but only abnormal results are displayed) Labs Reviewed  CBC WITH DIFFERENTIAL/PLATELET - Abnormal; Notable for the following components:      Result Value   Hemoglobin 12.7 (*)    HCT 37.8 (*)    Platelets 430 (*)    Neutro Abs 8.7 (*)    Lymphs Abs 0.2 (*)    Abs Immature Granulocytes 0.09 (*)    All other components within normal limits  URINALYSIS, ROUTINE W  REFLEX MICROSCOPIC - Abnormal; Notable for the following components:   Glucose, UA >=500 (*)    Hgb urine dipstick TRACE (*)    All other components within normal limits  URINALYSIS, MICROSCOPIC (REFLEX) - Abnormal; Notable for the following components:   Bacteria, UA FEW (*)    All other components within normal limits  LACTIC ACID, PLASMA  LACTIC ACID, PLASMA  COMPREHENSIVE METABOLIC PANEL    EKG None  Radiology No results found.  Procedures Procedures    Medications Ordered in ED Medications - No data to display  ED Course/ Medical Decision Making/ A&P                             Medical Decision Making Patient's labs ordered and reviewed, normal lactic acid, CBC without elevated white blood cell count there is a bit of sugar in the patient's urine.  Otherwise normal labs.  Feel patient is safe for discharge at this time.  Amount and/or Complexity of Data Reviewed Labs: ordered.           Final Clinical Impression(s) / ED Diagnoses Final diagnoses:  None    Rx / DC Orders ED Discharge Orders     None         Arthor Captain, PA-C 10/23/22 1457    Derwood Kaplan, MD 10/24/22 [phone]

## 2022-10-23 NOTE — ED Notes (Signed)
Discharge instructions reviewed with patient. Patient verbalizes understanding, no further questions at this time. Medications/prescriptions and follow up information provided. No acute distress noted at time of departure.  

## 2022-10-23 NOTE — ED Triage Notes (Signed)
Pt was instructed to be evaluated here d/t elevated lactic acid and elevated WBCs; pt has general malaise and fatigue; sts sxs started after receiving cortisone injections

## 2022-10-24 ENCOUNTER — Ambulatory Visit: Payer: Medicare Other | Attending: Cardiology

## 2022-10-24 DIAGNOSIS — I480 Paroxysmal atrial fibrillation: Secondary | ICD-10-CM

## 2022-10-24 LAB — POCT INR: INR: 3.1 — AB (ref 2.0–3.0)

## 2022-10-24 NOTE — Patient Instructions (Signed)
Description   Only take 0.5 tablet tomorrow and then continue taking warfarin 1 tablet daily except 1.5 tablets on Tuesdays and Saturdays.  While taking Doxycycline, continue to drink extra protein drinks.  Stay consistent with greens each week.  Recheck INR in 1 weeks.  [facility]# [phone]

## 2022-10-27 ENCOUNTER — Encounter: Payer: Self-pay | Admitting: Family Medicine

## 2022-10-28 ENCOUNTER — Other Ambulatory Visit: Payer: Self-pay

## 2022-10-28 MED ORDER — LANTUS SOLOSTAR 100 UNIT/ML ~~LOC~~ SOPN: 17.0000 [IU] | PEN_INJECTOR | Freq: Every day | SUBCUTANEOUS | 2 refills | Status: DC

## 2022-10-31 ENCOUNTER — Ambulatory Visit: Payer: Medicare Other | Attending: Cardiology | Admitting: *Deleted

## 2022-10-31 DIAGNOSIS — I4891 Unspecified atrial fibrillation: Secondary | ICD-10-CM

## 2022-10-31 DIAGNOSIS — I480 Paroxysmal atrial fibrillation: Secondary | ICD-10-CM | POA: Diagnosis not present

## 2022-10-31 DIAGNOSIS — Z7901 Long term (current) use of anticoagulants: Secondary | ICD-10-CM | POA: Diagnosis not present

## 2022-10-31 LAB — POCT INR: INR: 2.3 (ref 2.0–3.0)

## 2022-10-31 NOTE — Patient Instructions (Signed)
Description   Continue taking warfarin 1 tablet daily except 1.5 tablets on Tuesdays and Saturdays.  Stay consistent with greens/protein drinks each week. Recheck INR in 3 weeks.  [facility]# [phone]

## 2022-11-01 ENCOUNTER — Ambulatory Visit (INDEPENDENT_AMBULATORY_CARE_PROVIDER_SITE_OTHER): Payer: Medicare Other | Admitting: Medical

## 2022-11-01 ENCOUNTER — Other Ambulatory Visit: Payer: Self-pay | Admitting: Medical

## 2022-11-01 ENCOUNTER — Ambulatory Visit: Payer: Medicare Other | Admitting: Family Medicine

## 2022-11-01 VITALS — BP 142/70 | HR 82 | Resp 18 | Ht 70.0 in | Wt 175.2 lb

## 2022-11-01 DIAGNOSIS — Z794 Long term (current) use of insulin: Secondary | ICD-10-CM

## 2022-11-01 DIAGNOSIS — E0800 Diabetes mellitus due to underlying condition with hyperosmolarity without nonketotic hyperglycemic-hyperosmolar coma (NKHHC): Secondary | ICD-10-CM

## 2022-11-01 DIAGNOSIS — E0865 Diabetes mellitus due to underlying condition with hyperglycemia: Secondary | ICD-10-CM | POA: Diagnosis not present

## 2022-11-01 DIAGNOSIS — E1165 Type 2 diabetes mellitus with hyperglycemia: Secondary | ICD-10-CM

## 2022-11-01 DIAGNOSIS — Z79899 Other long term (current) drug therapy: Secondary | ICD-10-CM

## 2022-11-01 DIAGNOSIS — G4700 Insomnia, unspecified: Secondary | ICD-10-CM

## 2022-11-01 MED ORDER — INSULIN ASPART 100 UNIT/ML IJ SOLN: INTRAMUSCULAR | 99 refills | Status: DC

## 2022-11-01 NOTE — Progress Notes (Addendum)
Subjective:    Patient ID: ANCE REDIC, male    DOB: 1945/12/25, 77 y.o.   MRN: [id number]  HPI  Pt in for follow up.  Pt update me that his sugars are still running 250-400 at times. I had advise to increase to titrate up on lantus 26 units in the morning. Before titrating/increasing dose pt had described was mostly in 300's up to 400.  Pt is still on metformin twice daily.  Pt takes takes prednisone daily 10 mg rx'd by pulmonolgist.r   Pt is no longer sweating as he had been before. Wife states he was soaking shirts at night previously. I had rx'd doxycycline on last visit. Pt wbc was normalized. Lactic acid has come down as well. Not reported infection signs and symptoms presenlty.  Last AVS below   "1. Diabetes mellitus. Advise follow pcp advise as explained on last visit. Titrate up on lantus insulin by 2 units daily but stop titration if you get sugar level less than 100. Will see if possible infection contributing to increase. Recent steroid injections to both shoulders by orthopedist. This may be playing role in high sugars.   - POCT glucose (manual entry) - Comp Met (CMET)   2. Cough, unspecified type Will follow labs and chest xray. May decide on giving antibiotic after lab and xray review - CBC w/Diff - DG Chest 2 View; Future   3. Subacute osteomyelitis of thoracic spine  With history of thoracic spine infection in past due think worthwhile to do below labs. - Sedimentation rate - C-reactive protein - Lactic acid, plasma   4. Chills and Night sweats past 3 nights - Sedimentation rate - C-reactive protein - Lactic acid, plasma"    Review of Systems  Constitutional:  Positive for fatigue. Negative for chills and fever.  HENT:  Negative for congestion and drooling.   Respiratory:  Negative for chest tightness, shortness of breath and wheezing.   Cardiovascular:  Negative for chest pain and palpitations.  Gastrointestinal:  Negative for abdominal pain.   Genitourinary:  Negative for dysuria and flank pain.  Musculoskeletal:  Negative for back pain and neck pain.  Neurological:  Negative for seizures, facial asymmetry and light-headedness.  Hematological:  Negative for adenopathy. Does not bruise/bleed easily.  Psychiatric/Behavioral:  Negative for confusion, dysphoric mood and suicidal ideas. The patient is not nervous/anxious.    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   Esophageal reflux 02/10/2015   Fatigue    HTN (hypertension)    Hyperglycemia    preDM    Hyperlipidemia    Insomnia    Low back pain radiating to both legs 01/16/2017   Measles as a child   Overweight(278.02)    Peripheral neuropathy 08/07/2019   Personal history of colonic polyps 10/27/2012   Follows with [facility]   Pneumonia    " walking"   Preventative health care    Testosterone deficiency 05/23/2012   Wears glasses  Social History   Socioeconomic History   Marital status: Married    Spouse name: Not on file   Number of children: Not on file   Years of education: Not on file   Highest education level: 12th grade  Occupational History   Not on file  Tobacco Use   Smoking status: Former    Packs/day: 1.00    Years: 20.00    Additional pack years: 0.00    Total pack years: 20.00    Types: Cigarettes    Quit date: 07/18/1989    Years since quitting: 33.3   Smokeless tobacco: Never   Tobacco comments:    Former smoker 03/16/22  Vaping Use   Vaping Use: Never used  Substance and Sexual  Activity   Alcohol use: Yes    Alcohol/week: 2.0 standard drinks of alcohol    Types: 2 Standard drinks or equivalent per week    Comment: couple beers here and there 03/16/22   Drug use: No   Sexual activity: Yes    Comment: lives with wife, still working, no dietary restrictions, continues to exercise intermittently  Other Topics Concern   Not on file  Social History Narrative   Not on file   Social Determinants of Health   Financial Resource Strain: Low Risk  (10/20/2022)   Overall Financial Resource Strain (CARDIA)    Difficulty of Paying Living Expenses: Not hard at all  Food Insecurity: No Food Insecurity (10/20/2022)   Hunger Vital Sign    Worried About Running Out of Food in the Last Year: Never true    Ran Out of Food in the Last Year: Never true  Transportation Needs: No Transportation Needs (10/20/2022)   PRAPARE - Administrator, Civil Service (Medical): No    Lack of Transportation (Non-Medical): No  Physical Activity: Unknown (10/20/2022)   Exercise Vital Sign    Days of Exercise per Week: 0 days    Minutes of Exercise per Session: Not on file  Stress: Stress Concern Present (10/20/2022)   [employer] of Occupational Health - Occupational Stress Questionnaire    Feeling of Stress : To some extent  Social Connections: Socially Integrated (10/20/2022)   Social Connection and Isolation Panel [NHANES]    Frequency of Communication with Friends and Family: Three times a week    Frequency of Social Gatherings with Friends and Family: Not on file    Attends Religious Services: More than 4 times per year    Active Member of [employer] or Organizations: Yes    Attends Banker Meetings: More than 4 times per year    Marital Status: Married  Catering manager Violence: Not At Risk (01/27/2021)   Humiliation, Afraid, Rape, and Kick questionnaire    Fear of Current or Ex-Partner: No    Emotionally Abused: No    Physically Abused: No    Sexually Abused: No     Past Surgical History:  Procedure Laterality Date   ATRIAL FIBRILLATION ABLATION N/A 01/27/2022   Procedure: ATRIAL FIBRILLATION ABLATION;  Surgeon: Regan Lemming, MD;  Location: [facility];  Service: Cardiovascular;  Laterality: N/A;   BACK SURGERY  2012 and 1994   Dr Charlesetta Garibaldi, screws and cage in low back   BIOPSY  01/09/2020   Procedure: BIOPSY;  Surgeon: Kathi Der, MD;  Location: [facility];  Service: Gastroenterology;;   BRONCHIAL BIOPSY  11/17/2020   Procedure: BRONCHIAL BIOPSIES;  Surgeon: Tomma Lightning, MD;  Location: [facility];  Service:  Endoscopy;;   BRONCHIAL BRUSHINGS  11/17/2020   Procedure: BRONCHIAL BRUSHINGS;  Surgeon: Tomma Lightning, MD;  Location: [facility];  Service: Endoscopy;;   BRONCHIAL WASHINGS  11/17/2020   Procedure: BRONCHIAL WASHINGS;  Surgeon: Tomma Lightning, MD;  Location: [facility];  Service: Endoscopy;;   COLONOSCOPY WITH PROPOFOL N/A 01/09/2020   Procedure: COLONOSCOPY WITH PROPOFOL;  Surgeon: Kathi Der, MD;  Location: [facility];  Service: Gastroenterology;  Laterality: N/A;   ESOPHAGOGASTRODUODENOSCOPY (EGD) WITH PROPOFOL N/A 01/09/2020   Procedure: ESOPHAGOGASTRODUODENOSCOPY (EGD) WITH PROPOFOL;  Surgeon: Kathi Der, MD;  Location: [facility];  Service: Gastroenterology;  Laterality: N/A;   EYE SURGERY Bilateral    2016   HARDWARE REVISION  03/12/2019   REVISION OF HARDWARE THORACIC TEN-THORACIC ELEVEN WITH APPLICATION OF ADDITIONAL RODS AND ROD SLEEVES THORACIC EIGHT-THORACIC TWELVE 12-LUMBAR ONE (N/A )   HEMOSTASIS CONTROL  11/17/2020   Procedure: HEMOSTASIS CONTROL;  Surgeon: Tomma Lightning, MD;  Location: [facility];  Service: Endoscopy;;   IR US GUIDE BX ASP/DRAIN  11/13/2020   LAMINECTOMY  02/12/2019   DECOMPRESSIVE LAMINECTOMY THORACIC NINE-THORACIC TEN AND THORACIC TEN-THORACIC ELEVEN, EXTENSION OF THORACOLUMBAR FUSION FROM THORACIC TEN TO THORACIC FIVE, LOCAL BONE GRAFT, ALLOGRAFT AND  VIVIGEN (N/A)   POLYPECTOMY  01/09/2020   Procedure: POLYPECTOMY;  Surgeon: Kathi Der, MD;  Location: [facility];  Service: Gastroenterology;;   REMOVAL OF RODS AND PEDICLE SCREWS T5 AND T6, EXPLORATION OF FUSION, BIOPSY TRANSPEDICULAR T5 AND T6 (N/A )  09/14/2020   SPINAL FUSION N/A 02/12/2019   Procedure: DECOMPRESSIVE LAMINECTOMY THORACIC NINE-THORACIC TEN AND THORACIC TEN-THORACIC ELEVEN, EXTENSION OF THORACOLUMBAR FUSION FROM THORACIC TEN TO THORACIC FIVE, LOCAL BONE GRAFT, ALLOGRAFT AND VIVIGEN;  Surgeon: Kerrin Champagne, MD;  Location: [facility];  Service: Orthopedics;  Laterality: N/A;  DECOMPRESSIVE LAMINECTOMY THORACIC NINE-THORACIC TEN AND THORACIC TEN-THORACIC ELEVEN, EXTENSION OF TH   SPINAL FUSION N/A 03/12/2019   Procedure: REVISION OF HARDWARE THORACIC TEN-THORACIC ELEVEN WITH APPLICATION OF ADDITIONAL RODS AND ROD SLEEVES THORACIC EIGHT-THORACIC TWELVE 12-LUMBAR ONE;  Surgeon: Kerrin Champagne, MD;  Location: [facility];  Service: Orthopedics;  Laterality: N/A;   TONSILLECTOMY     as child   torn rotator cuff  2010   right   VIDEO BRONCHOSCOPY N/A 11/17/2020   Procedure: VIDEO BRONCHOSCOPY WITH FLUORO;  Surgeon: Tomma Lightning, MD;  Location: [facility];  Service: Endoscopy;  Laterality: N/A;    Family History  Problem Relation Age of Onset   Hypertension Mother    Diabetes Mother        type 2   Cancer Mother 33       breast in remission   Emphysema Father        smoker   COPD Father        smoker   Stroke Father 60       mini   Heart disease Father    Hypertension Sister    Hyperlipidemia Sister    Scoliosis Sister    Osteoporosis Sister    Hypertension Maternal Grandmother    Scoliosis Maternal Grandmother    Heart disease Maternal Grandfather    Heart disease Paternal Grandfather        smoker   Heart disease Daughter     Allergies  Allergen Reactions   Daptomycin     Drug induced pneumonia    Diltiazem Other (See Comments)    Passed out     Current Outpatient Medications on File Prior to Visit  Medication Sig Dispense  Refill   albuterol (PROVENTIL HFA) 108 (90 Base) MCG/ACT inhaler TAKE 2 PUFFS BY MOUTH EVERY 6 HOURS AS NEEDED FOR WHEEZE OR SHORTNESS OF BREATH 8 g 5   albuterol (VENTOLIN HFA) 108 (90 Base) MCG/ACT inhaler Inhale 2 puffs into the lungs every 6 (six) hours as needed. 18 g 0   atorvastatin (LIPITOR) 10 MG tablet TAKE 1 TABLET BY MOUTH EVERY DAY 90 tablet 1   Baclofen 5 MG TABS Take 1 tablet by mouth 2 (two) times daily. 60 tablet 1   busPIRone (BUSPAR) 15 MG tablet Take 1 tablet (15 mg total) by mouth 2 (two) times daily. 180 tablet 0   dextromethorphan-guaiFENesin (MUCINEX DM) 30-600 MG 12hr tablet Take 1 tablet by mouth 2 (two) times daily.     doxycycline (VIBRA-TABS) 100 MG tablet Take 1 tablet (100 mg total) by mouth 2 (two) times daily. 20 tablet 0   famciclovir (FAMVIR) 500 MG tablet TAKE 1 TABLET BY MOUTH THREE TIMES A DAY 21 tablet 0   fluticasone (FLONASE) 50 MCG/ACT nasal spray SPRAY 2 SPRAYS INTO EACH NOSTRIL EVERY DAY 48 mL 1   Fluticasone-Umeclidin-Vilant (TRELEGY ELLIPTA) 200-62.5-25 MCG/ACT AEPB Inhale 1 puff into the lungs daily. 60 each 5   glucose blood (CONTOUR NEXT TEST) test strip Check blood sugar once daily 100 each 12   insulin glargine (LANTUS SOLOSTAR) 100 UNIT/ML Solostar Pen Inject 17 Units into the skin daily. 100 mL 2   Insulin Pen Needle (B-D UF III MINI PEN NEEDLES) 31G X 5 MM MISC USE WITH LANTUS 100 each 3   JANUVIA 50 MG tablet TAKE 1 TABLET BY MOUTH EVERY DAY 30 tablet 3   metFORMIN (GLUCOPHAGE) 1000 MG tablet TAKE 1 TABLET (1,000 MG TOTAL) BY MOUTH TWICE A DAY WITH FOOD 180 tablet 0   Nintedanib (OFEV) 150 MG CAPS Take 1 capsule (150 mg total) by mouth 2 (two) times daily. 180 capsule 1   oxyCODONE-acetaminophen (PERCOCET/ROXICET) 5-325 MG tablet Take 1 tablet by mouth every 4 (four) hours as needed for pain- frequency change 180 tablet 0   pantoprazole (PROTONIX) 40 MG tablet  TAKE 1 TABLET BY MOUTH TWICE A DAY BEFORE A MEAL 180 tablet 1   predniSONE (DELTASONE) 10 MG tablet TAKE 1 TABLET (10 MG TOTAL) BY MOUTH DAILY WITH BREAKFAST. 30 tablet 3   pregabalin (LYRICA) 75 MG capsule TAKE 1 CAPSULE BY MOUTH TWICE A DAY (Patient taking differently: Take 75 mg by mouth 2 (two) times daily.) 60 capsule 3   tamsulosin (FLOMAX) 0.4 MG CAPS capsule TAKE 2 CAPSULES BY MOUTH EVERY DAY 180 capsule 3   tiZANidine (ZANAFLEX) 4 MG tablet Take 1 and 1/2 tablets (6 mg total) by mouth every 4 hours. 180 tablet 2   triamcinolone cream (KENALOG) 0.1 % Apply a small amount to affected area twice a day as needed 30 g 1   venlafaxine XR (EFFEXOR-XR) 150 MG 24 hr capsule TAKE 1 CAPSULE BY MOUTH DAILY WITH BREAKFAST. 90 capsule 0   Vitamin D, Ergocalciferol, (DRISDOL) 1.25 MG (50000 UNIT) CAPS capsule Take 1 capsule (50,000 Units total) by mouth every 7 (seven) days. 8 capsule 0   warfarin (COUMADIN) 5 MG tablet TAKE 1 TO 2 TABLETS DAILY OR AS PRESCRIBED BY COUMADIN CLINIC 130 tablet 1   zolpidem (AMBIEN) 10 MG tablet Take 0.5-1 tablets (5-10 mg total) by mouth at bedtime as needed for sleep. 15 tablet 1   No current facility-administered medications on file prior to visit.    BP Marland Kitchen)  142/70   Pulse 82   Resp 18   Ht 5\' 10"  (1.778 m)   Wt 175 lb 3.2 oz (79.5 kg)   SpO2 95%   BMI 25.14 kg/m        Objective:   Physical Exam   General Mental Status- Alert. General Appearance- Not in acute distress.   Skin General: Color- Normal Color. Moisture- Normal Moisture.  Neck Carotid Arteries- Normal color. Moisture- Normal Moisture. No carotid bruits. No JVD.  Chest and Lung Exam Auscultation: Breath Sounds:-Normal.  Cardiovascular Auscultation:Rythm- Regular. Murmurs & Other Heart Sounds:Auscultation of the heart reveals- No Murmurs.  Abdomen Inspection:-Inspeection Normal. Palpation/Percussion:Note:No mass. Palpation and Percussion of the abdomen reveal- Non Tender, Non  Distended + BS, no rebound or guarding.   Neurologic Cranial Nerve exam:- CN III-XII intact(No nystagmus), symmetric smile. Strength:- 5/5 equal and symmetric strength both upper and lower extremities.      Assessment & Plan:   Patient Instructions  1. High risk medication use - Drug Monitoring Panel [phone] , Urine Contract signed. Continue ambien.  2. Insomnia, unspecified type Ambien rx  3. Diabetes mellitus with hyperglycemia.  -Presently stay on Lantus 26 units in the morning. -Will add on NovoLog sliding scale insulin to use below scheduled.  Make sure you check sugar before you eat and inject sliding scale accordingly.  160-201 3 units novolog. 201- 250 5 units 251-300 8 units 301-350 10 units 351-400 12 units   Let me know if with starting meal time  insulin any sugars less than 100. If severe symptoms and sugar over 400 be seen in ED.  Follow up in 2 weeks or sooner if needed

## 2022-11-01 NOTE — Patient Instructions (Addendum)
1. High risk medication use - Drug Monitoring Panel [phone] , Urine Contract signed. Continue ambien.  2. Insomnia, unspecified type Ambien rx  3. Diabetes mellitus with hyperglycemia.  -Presently stay on Lantus 26 units in the morning. -Will add on NovoLog sliding scale insulin to use below scheduled.  Make sure you check sugar before you eat and inject sliding scale accordingly.  160-201 3 units novolog. 201- 250 5 units 251-300 8 units 301-350 10 units 351-400 12 units   Let me know if with starting meal time  insulin any sugars less than 100. If severe symptoms and sugar over 400 be seen in ED.  Follow up in 2 weeks or sooner if needed

## 2022-11-02 ENCOUNTER — Encounter: Payer: Self-pay | Admitting: Medical

## 2022-11-02 ENCOUNTER — Other Ambulatory Visit: Payer: Self-pay

## 2022-11-02 ENCOUNTER — Encounter: Payer: Self-pay | Admitting: Family Medicine

## 2022-11-02 MED ORDER — LANTUS SOLOSTAR 100 UNIT/ML ~~LOC~~ SOPN: 24.0000 [IU] | PEN_INJECTOR | Freq: Every day | SUBCUTANEOUS | 2 refills | Status: DC

## 2022-11-02 NOTE — Telephone Encounter (Signed)
Pharmacy comment: Alternative Requested:NON FORMULARY.

## 2022-11-04 ENCOUNTER — Other Ambulatory Visit: Payer: Self-pay

## 2022-11-04 ENCOUNTER — Other Ambulatory Visit: Payer: Self-pay | Admitting: Family Medicine

## 2022-11-04 LAB — DRUG MONITORING PANEL 376104, URINE
Amphetamines: NEGATIVE ng/mL (ref <500)
Barbiturates: NEGATIVE ng/mL (ref <300)
Benzodiazepines: NEGATIVE ng/mL (ref <100)
Cocaine Metabolite: NEGATIVE ng/mL (ref <150)
Codeine: NEGATIVE ng/mL (ref <50)
Desmethyltramadol: NEGATIVE ng/mL (ref <100)
Hydrocodone: NEGATIVE ng/mL (ref <50)
Hydromorphone: NEGATIVE ng/mL (ref <50)
Morphine: NEGATIVE ng/mL (ref <50)
Norhydrocodone: NEGATIVE ng/mL (ref <50)
Noroxycodone: 610 ng/mL — ABNORMAL HIGH (ref <50)
Opiates: NEGATIVE ng/mL (ref <100)
Oxycodone: 286 ng/mL — ABNORMAL HIGH (ref <50)
Oxycodone: POSITIVE ng/mL — AB (ref <100)
Oxymorphone: 138 ng/mL — ABNORMAL HIGH (ref <50)
Tramadol: NEGATIVE ng/mL (ref <100)

## 2022-11-04 LAB — DM TEMPLATE

## 2022-11-04 MED ORDER — ZOLPIDEM TARTRATE 10 MG PO TABS: 5.0000 mg | ORAL_TABLET | Freq: Every evening | ORAL | 3 refills | Status: DC | PRN

## 2022-11-06 NOTE — Progress Notes (Unsigned)
Cardiology Office Note Date:  11/07/2022  Patient ID:  Luis, Brown Jul 15, 1946, MRN [id number] Electrophysiologist: Dr. Elberta Fortis   Chief Complaint:  6 mo  History of Present Illness: Luis Brown is a 77 y.o. male with history of HTN, HLD, DM, COPD, DVT/PE (2023), AFib  He saw DR. Camnitz 04/27/22, 3 mo post ablation visit, no symptoms of his Afib, feeling well, pt wanted to get off a/c, planned to discuss further at his next visit  Recent trouble with high BS, on steroids seems for his COPD, saw PMD service 11/01/22 with mention of subacute OM of the T-spine, planned for labs with reports chills night sweats.  Insulin adjusted  TODAY He is accompanied by his wife. He has not yet gotten his new insulin yet, BS at home typically 200's They will be picking that up today to get started and following closely with his PMD team   From a cardiac perspective he feel like he is doing well No CP, palpitations or cardiac awareness Reports that he does not recall, but didn't think he was particularly symptomatic with his AFib, but thinks he would know if had some and does not thin he has had any heart rhythm issues. No dizzy spells, near syncope or syncope. Some baseline SOB with his COPD/ling fibrosis, at his baseline No bleeding or signs of bleeding   AFib/AAD hx Diagnosed July 2022 PVI ablation 01/27/22  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   Esophageal reflux 02/10/2015   Fatigue    HTN (hypertension)    Hyperglycemia    preDM     Hyperlipidemia    Insomnia    Low back pain radiating to both legs 01/16/2017   Measles as a child   Overweight(278.02)    Peripheral neuropathy 08/07/2019   Personal history of colonic polyps 10/27/2012   Follows with [facility]   Pneumonia    " walking"   Preventative health care    Testosterone deficiency 05/23/2012   Wears glasses     Past Surgical History:  Procedure Laterality Date   ATRIAL FIBRILLATION ABLATION N/A 01/27/2022   Procedure: ATRIAL FIBRILLATION ABLATION;  Surgeon: Regan Lemming, MD;  Location: [facility];  Service: Cardiovascular;  Laterality: N/A;   BACK SURGERY  2012 and 1994   Dr Charlesetta Garibaldi, screws and cage in low back   BIOPSY  01/09/2020   Procedure: BIOPSY;  Surgeon: Kathi Der, MD;  Location: [facility];  Service: Gastroenterology;;   BRONCHIAL BIOPSY  11/17/2020   Procedure: BRONCHIAL BIOPSIES;  Surgeon: Tomma Lightning, MD;  Location: [facility];  Service: Endoscopy;;   BRONCHIAL BRUSHINGS  11/17/2020   Procedure: BRONCHIAL BRUSHINGS;  Surgeon: Tomma Lightning, MD;  Location: [facility];  Service: Endoscopy;;   BRONCHIAL WASHINGS  11/17/2020   Procedure: BRONCHIAL WASHINGS;  Surgeon: Tomma Lightning, MD;  Location: [facility];  Service: Endoscopy;;   COLONOSCOPY WITH PROPOFOL N/A 01/09/2020   Procedure: COLONOSCOPY WITH  PROPOFOL;  Surgeon: Kathi Der, MD;  Location: [facility];  Service: Gastroenterology;  Laterality: N/A;   ESOPHAGOGASTRODUODENOSCOPY (EGD) WITH PROPOFOL N/A 01/09/2020   Procedure: ESOPHAGOGASTRODUODENOSCOPY (EGD) WITH PROPOFOL;  Surgeon: Kathi Der, MD;  Location: [facility];  Service: Gastroenterology;  Laterality: N/A;   EYE SURGERY Bilateral    2016   HARDWARE REVISION  03/12/2019   REVISION OF HARDWARE THORACIC TEN-THORACIC ELEVEN WITH APPLICATION OF ADDITIONAL RODS AND ROD SLEEVES THORACIC EIGHT-THORACIC TWELVE 12-LUMBAR ONE (N/A )   HEMOSTASIS CONTROL  11/17/2020   Procedure:  HEMOSTASIS CONTROL;  Surgeon: Tomma Lightning, MD;  Location: [facility];  Service: Endoscopy;;   IR US GUIDE BX ASP/DRAIN  11/13/2020   LAMINECTOMY  02/12/2019   DECOMPRESSIVE LAMINECTOMY THORACIC NINE-THORACIC TEN AND THORACIC TEN-THORACIC ELEVEN, EXTENSION OF THORACOLUMBAR FUSION FROM THORACIC TEN TO THORACIC FIVE, LOCAL BONE GRAFT, ALLOGRAFT AND VIVIGEN (N/A)   POLYPECTOMY  01/09/2020   Procedure: POLYPECTOMY;  Surgeon: Kathi Der, MD;  Location: [facility];  Service: Gastroenterology;;   REMOVAL OF RODS AND PEDICLE SCREWS T5 AND T6, EXPLORATION OF FUSION, BIOPSY TRANSPEDICULAR T5 AND T6 (N/A )  09/14/2020   SPINAL FUSION N/A 02/12/2019   Procedure: DECOMPRESSIVE LAMINECTOMY THORACIC NINE-THORACIC TEN AND THORACIC TEN-THORACIC ELEVEN, EXTENSION OF THORACOLUMBAR FUSION FROM THORACIC TEN TO THORACIC FIVE, LOCAL BONE GRAFT, ALLOGRAFT AND VIVIGEN;  Surgeon: Kerrin Champagne, MD;  Location: [facility];  Service: Orthopedics;  Laterality: N/A;  DECOMPRESSIVE LAMINECTOMY THORACIC NINE-THORACIC TEN AND THORACIC TEN-THORACIC ELEVEN, EXTENSION OF TH   SPINAL FUSION N/A 03/12/2019   Procedure: REVISION OF HARDWARE THORACIC TEN-THORACIC ELEVEN WITH APPLICATION OF ADDITIONAL RODS AND ROD SLEEVES THORACIC EIGHT-THORACIC TWELVE 12-LUMBAR ONE;  Surgeon: Kerrin Champagne, MD;  Location: [facility];  Service: Orthopedics;  Laterality: N/A;   TONSILLECTOMY     as child   torn rotator cuff  2010   right   VIDEO BRONCHOSCOPY N/A 11/17/2020   Procedure: VIDEO BRONCHOSCOPY WITH FLUORO;  Surgeon: Tomma Lightning, MD;  Location: [facility];  Service: Endoscopy;  Laterality: N/A;    Current Outpatient Medications  Medication Sig Dispense Refill   albuterol (PROVENTIL HFA) 108 (90 Base) MCG/ACT inhaler TAKE 2 PUFFS BY MOUTH EVERY 6 HOURS AS NEEDED FOR WHEEZE OR SHORTNESS OF BREATH 8 g 5   albuterol (VENTOLIN HFA) 108 (90 Base) MCG/ACT inhaler Inhale 2 puffs into the lungs every 6 (six) hours as needed. 18 g 0    atorvastatin (LIPITOR) 10 MG tablet TAKE 1 TABLET BY MOUTH EVERY DAY 90 tablet 1   busPIRone (BUSPAR) 15 MG tablet Take 1 tablet (15 mg total) by mouth 2 (two) times daily. 180 tablet 0   dextromethorphan-guaiFENesin (MUCINEX DM) 30-600 MG 12hr tablet Take 1 tablet by mouth 2 (two) times daily.     fluticasone (FLONASE) 50 MCG/ACT nasal spray SPRAY 2 SPRAYS INTO EACH NOSTRIL EVERY DAY 48 mL 1   Fluticasone-Umeclidin-Vilant (TRELEGY ELLIPTA) 200-62.5-25 MCG/ACT AEPB Inhale 1 puff into the lungs daily. 60 each 5   glucose blood (CONTOUR NEXT TEST) test strip Check blood sugar once daily 100 each 12   insulin glargine (LANTUS SOLOSTAR) 100 UNIT/ML Solostar Pen Inject 24 Units into the skin daily. 100 mL 2   insulin lispro (HUMALOG) 100 UNIT/ML injection Inject 0.03 mLs (3 Units total) into the skin 3 (three) times daily with meals. 100 mL 1   Insulin Pen Needle (B-D UF III MINI PEN NEEDLES) 31G X 5 MM MISC USE WITH LANTUS 100 each 3   JANUVIA  50 MG tablet TAKE 1 TABLET BY MOUTH EVERY DAY 30 tablet 3   metFORMIN (GLUCOPHAGE) 1000 MG tablet TAKE 1 TABLET (1,000 MG TOTAL) BY MOUTH TWICE A DAY WITH FOOD 180 tablet 0   oxyCODONE-acetaminophen (PERCOCET/ROXICET) 5-325 MG tablet Take 1 tablet by mouth every 4 (four) hours as needed for pain- frequency change 180 tablet 0   pantoprazole (PROTONIX) 40 MG tablet TAKE 1 TABLET BY MOUTH TWICE A DAY BEFORE A MEAL 180 tablet 1   predniSONE (DELTASONE) 10 MG tablet TAKE 1 TABLET (10 MG TOTAL) BY MOUTH DAILY WITH BREAKFAST. 30 tablet 3   pregabalin (LYRICA) 75 MG capsule TAKE 1 CAPSULE BY MOUTH TWICE A DAY (Patient taking differently: Take 75 mg by mouth 2 (two) times daily.) 60 capsule 3   tamsulosin (FLOMAX) 0.4 MG CAPS capsule TAKE 2 CAPSULES BY MOUTH EVERY DAY 180 capsule 3   tiZANidine (ZANAFLEX) 4 MG tablet Take 1 and 1/2 tablets (6 mg total) by mouth every 4 hours. 180 tablet 2   triamcinolone cream (KENALOG) 0.1 % Apply a small amount to affected area twice a  day as needed 30 g 1   venlafaxine XR (EFFEXOR-XR) 150 MG 24 hr capsule TAKE 1 CAPSULE BY MOUTH DAILY WITH BREAKFAST. 90 capsule 0   Vitamin D, Ergocalciferol, (DRISDOL) 1.25 MG (50000 UNIT) CAPS capsule Take 1 capsule (50,000 Units total) by mouth every 7 (seven) days. 8 capsule 0   warfarin (COUMADIN) 5 MG tablet TAKE 1 TO 2 TABLETS DAILY OR AS PRESCRIBED BY COUMADIN CLINIC 130 tablet 1   zolpidem (AMBIEN) 10 MG tablet Take 0.5-1 tablets (5-10 mg total) by mouth at bedtime as needed for sleep. 15 tablet 3   No current facility-administered medications for this visit.    Allergies:   Daptomycin and Diltiazem   Social History:  The patient  reports that he quit smoking about 33 years ago. His smoking use included cigarettes. He has a 20.00 pack-year smoking history. He has never used smokeless tobacco. He reports current alcohol use of about 2.0 standard drinks of alcohol per week. He reports that he does not use drugs.   Family History:  The patient's family history includes COPD in his father; Cancer (age of onset: 20) in his mother; Diabetes in his mother; Emphysema in his father; Heart disease in his daughter, father, maternal grandfather, and paternal grandfather; Hyperlipidemia in his sister; Hypertension in his maternal grandmother, mother, and sister; Osteoporosis in his sister; Scoliosis in his maternal grandmother and sister; Stroke (age of onset: 54) in his father.  ROS:  Please see the history of present illness.    All other systems are reviewed and otherwise negative.   PHYSICAL EXAM:  VS:  BP 122/70   Pulse 79   Ht 5\' 10"  (1.778 m)   Wt 176 lb 9.6 oz (80.1 kg)   SpO2 98%   BMI 25.34 kg/m  BMI: Body mass index is 25.34 kg/m. Well nourished, well developed, in no acute distress HEENT: normocephalic, atraumatic Neck: no JVD, carotid bruits or masses Cardiac:   RRR; no significant murmurs, no rubs, or gallops Lungs:  CTA b/l, no wheezing, rhonchi or rales Abd: soft,  nontender MS: no deformity or atrophy Ext: no edema Skin: warm and dry, no rash Neuro:  No gross deficits appreciated Psych: euthymic mood, full affect   EKG:  Done today and reviewed by myself shows  SR 79bpm, LAD, no significant changes from prios   01/27/22: EPS/ablation CONCLUSIONS: 1. Sinus rhythm  upon presentation.   2. Successful electrical isolation and anatomical encircling of all four pulmonary veins with radiofrequency current.  A WACA approach was used 3. Additional left atrial ablation was performed with a standard box lesion created along the posterior wall of the left atrium 4.  No early apparent complications.   07/20/21: TTE  1. Left ventricular ejection fraction, by estimation, is 60 to 65%. The  left ventricle has normal function. The left ventricle has no regional  wall motion abnormalities. Left ventricular diastolic parameters are  indeterminate.   2. Right ventricular systolic function is normal. The right ventricular  size is normal. There is normal pulmonary artery systolic pressure.   3. The mitral valve is normal in structure. No evidence of mitral valve  regurgitation. No evidence of mitral stenosis.   4. The aortic valve is tricuspid. Aortic valve regurgitation is not  visualized. No aortic stenosis is present.   5. The inferior vena cava is normal in size with greater than 50%  respiratory variability, suggesting right atrial pressure of 3 mmHg.   Comparison(s): A prior study was performed on 12/30.2022. No significant  change from prior study.   Recent Labs: 01/25/2022: Magnesium 2.0 08/23/2022: TSH 3.20 10/23/2022: ALT 22; BUN 15; Creatinine, Ser 0.71; Hemoglobin 12.7; Platelets 430; Potassium 4.3; Sodium 123  03/15/2022: Cholesterol 171; HDL 79.80; LDL Cholesterol 70; Total CHOL/HDL Ratio 2; Triglycerides 104.0; VLDL 20.8   Estimated Creatinine Clearance: 77.3 mL/min (by C-G formula based on SCr of 0.71 mg/dL).   Wt Readings from Last 3 Encounters:   11/07/22 176 lb 9.6 oz (80.1 kg)  11/01/22 175 lb 3.2 oz (79.5 kg)  10/20/22 176 lb (79.8 kg)     Other studies reviewed: Additional studies/records reviewed today include: summarized above  ASSESSMENT AND PLAN:  Paroxysmal Afib CHA2DS2Vasc is 6, on warfarin No/lowo burden by symptoms Advised continued a/c given his risk score, he is agreeable   HTN Looks good  Secondary hypercoagulable state  Disposition: F/u with [place] again in 39mo, sooner if needed  Current medicines are reviewed at length with the patient today.  The patient did not have any concerns regarding medicines.  Norma Fredrickson, PA-C 11/07/2022 10:28 AM     [facility] [address] [place] [address] [phone] (office)  [phone] (fax)

## 2022-11-07 ENCOUNTER — Ambulatory Visit: Payer: Medicare Other | Admitting: Medical

## 2022-11-07 ENCOUNTER — Ambulatory Visit: Payer: Medicare Other | Attending: Physician Assistant | Admitting: Physician Assistant

## 2022-11-07 ENCOUNTER — Encounter: Payer: Self-pay | Admitting: Physician Assistant

## 2022-11-07 ENCOUNTER — Other Ambulatory Visit (INDEPENDENT_AMBULATORY_CARE_PROVIDER_SITE_OTHER): Payer: Medicare Other

## 2022-11-07 ENCOUNTER — Ambulatory Visit: Payer: Medicare Other | Admitting: Physician Assistant

## 2022-11-07 VITALS — BP 122/70 | HR 79 | Ht 70.0 in | Wt 176.6 lb

## 2022-11-07 DIAGNOSIS — I480 Paroxysmal atrial fibrillation: Secondary | ICD-10-CM | POA: Diagnosis not present

## 2022-11-07 DIAGNOSIS — M25552 Pain in left hip: Secondary | ICD-10-CM

## 2022-11-07 DIAGNOSIS — I10 Essential (primary) hypertension: Secondary | ICD-10-CM

## 2022-11-07 DIAGNOSIS — M25551 Pain in right hip: Secondary | ICD-10-CM

## 2022-11-07 DIAGNOSIS — D6869 Other thrombophilia: Secondary | ICD-10-CM

## 2022-11-07 MED ORDER — METHYLPREDNISOLONE ACETATE 40 MG/ML IJ SUSP
40.0000 mg | INTRAMUSCULAR | Status: AC | PRN
Start: 2022-11-07 — End: 2022-11-07
  Administered 2022-11-07: 40 mg via INTRA_ARTICULAR

## 2022-11-07 MED ORDER — LIDOCAINE HCL 1 % IJ SOLN
2.0000 mL | INTRAMUSCULAR | Status: AC | PRN
Start: 2022-11-07 — End: 2022-11-07
  Administered 2022-11-07: 2 mL

## 2022-11-07 MED ORDER — BUPIVACAINE HCL 0.25 % IJ SOLN
2.0000 mL | INTRAMUSCULAR | Status: AC | PRN
Start: 2022-11-07 — End: 2022-11-07
  Administered 2022-11-07: 2 mL via INTRA_ARTICULAR

## 2022-11-07 NOTE — Progress Notes (Addendum)
Office Visit Note   Patient: Luis Brown           Date of Birth: 1945/10/14           MRN: [id number] Visit Date: 11/07/2022              Requested by: Bradd Canary, MD [address] [place],  [place] [address] PCP: Bradd Canary, MD  Chief Complaint  Patient presents with  . Left Hip - Pain  . Right Hip - Pain      HPI: Patient presents today with a 102-month history of right greater than left hip pain.  Denies any injuries.    Denies any fever chills denies any back pain denies any weakness.  He said it originally radiated into the groin quite a bit but more so on the lateral side of his hip.  He is in pain management and does take oxycodone.  He rates his pain is moderate most noticed on the lateral hip when he gets up after sitting sometimes when he has been laying on that side for a while   Assessment & Plan: Visit Diagnoses:  1. 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  He does have diabetes that is not always under good control.  He did speak with his doctor last week about the possibility of having an injection and she was fine with that.  He is going to see his physician today and is going to be starting a sliding scale.  Follow-Up Instructions: No follow-ups on file.   Ortho Exam  Patient is alert, oriented, no adenopathy, well-dressed, normal affect, normal respiratory effort. Exam of the left hip he has no bruising no erythema no redness compartments are soft and nontender.  He has good strength with dorsiflexion and plantarflexion of his ankles and knees.  No real pain with manipulation no real pain with manipulation rolling of his left hip.  Negative straight leg raise no pain in his back  Imaging: No results found. No images are attached to the encounter.  Labs: Lab Results   Component Value Date   HGBA1C 10.4 (H) 08/23/2022   HGBA1C 9.0 (H) 03/15/2022   HGBA1C 10.2 (H) 08/16/2021   ESRSEDRATE 19 10/20/2022   ESRSEDRATE 15 08/23/2022   ESRSEDRATE 2 06/16/2022   CRP 22.1 (H) 10/20/2022   CRP 5.6 08/23/2022   CRP 7.6 06/16/2022   REPTSTATUS 08/12/2021 FINAL 08/07/2021   GRAMSTAIN  11/17/2020    RARE WBC PRESENT, PREDOMINANTLY PMN NO ORGANISMS SEEN    CULT  08/07/2021    NO GROWTH 5 DAYS Performed at [facility], [address] [place], [place] [address]    [facility] STAPHYLOCOCCUS EPIDERMIDIS 09/14/2020     Lab Results  Component Value Date   ALBUMIN 3.4 (L) 10/23/2022   ALBUMIN 3.9 10/20/2022   ALBUMIN 3.8 09/02/2022    Lab Results  Component Value Date   MG 2.0 01/25/2022   MG 2.2 07/22/2021   MG 2.0 07/21/2021   Lab Results  Component Value Date   VD25OH 14.61 (L) 09/02/2022    No results found for: "PREALBUMIN"    Latest Ref Rng & Units 10/23/2022    1:19 PM 10/20/2022   11:21 AM 09/02/2022    1:10 PM  CBC EXTENDED  WBC 4.0 - 10.5 K/uL 9.7  16.4  10.2   RBC 4.22 - 5.81 MIL/uL 4.32  4.51  4.33   Hemoglobin 13.0 - 17.0 g/dL 65.7  84.6  96.2   HCT 39.0 - 52.0 % 37.8  39.7  38.0   Platelets 150 - 400 K/uL 430  440.0  454.0   NEUT# 1.7 - 7.7 K/uL 8.7  14.7  8.8   Lymph# 0.7 - 4.0 K/uL 0.2  0.2  0.2      There is no height or weight on file to calculate BMI.  Orders:  Orders Placed This Encounter  Procedures  . XR Pelvis 1-2 Views   No orders of the defined types were placed in this encounter.    Procedures: Large Joint Inj: R greater trochanter on 11/07/2022 3:16 PM Indications: pain and diagnostic evaluation Details: 25 G 1.5 in needle, lateral approach  Arthrogram: No  Medications: 2 mL lidocaine 1 %; 40 mg methylPREDNISolone acetate 40 MG/ML; 2 mL bupivacaine 0.25 % Outcome: tolerated well, no immediate complications Procedure, treatment alternatives, risks and benefits explained, specific risks discussed.  Consent was given by the patient.    Clinical Data: No additional findings.  ROS:  All other systems negative, except as noted in the HPI. Review of Systems  Objective: Vital Signs: There were no vitals taken for this visit.  Specialty Comments:  No specialty comments available.  PMFS History: Patient Active Problem List   Diagnosis Date Noted  . Anemia 09/04/2022  . Hypocalcemia 09/04/2022  . Secondary hypercoagulable state 03/16/2022  . IPF (idiopathic pulmonary fibrosis) 12/30/2021  . Atrial fibrillation 09/10/2021  . Long term (current) use of anticoagulants 09/10/2021  . Diabetes mellitus due to underlying condition with hyperosmolarity without coma, with long-term current use of insulin 09/10/2021  . Mixed hyperlipidemia 09/10/2021  . Aspiration pneumonitis 08/07/2021  . Chronic respiratory failure with hypoxia 08/07/2021  . Pulmonary emboli 07/16/2021  . Bronchiectasis with acute exacerbation 07/16/2021  . History of DVT (deep vein thrombosis) 07/15/2021  . Paroxysmal A-fib 07/15/2021  . COPD (chronic obstructive pulmonary disease) 02/10/2021  . Palpitations 02/01/2021  . Wears glasses 01/11/2021  . Pneumothorax, right 12/15/2020  . Anxiety 12/01/2020  . SOB (shortness of breath)   . Nocturnal hypoxia 11/14/2020  . S/P bronchoscopy 09/14/2020  . Gastric ulcer 01/27/2020  . H/O colonoscopy with polypectomy 01/27/2020  . Protein-calorie malnutrition, severe 01/09/2020  . Abdominal pain 12/30/2019  . Weight loss, abnormal 12/30/2019  . Change in bowel habits 12/30/2019  . Medication monitoring encounter 08/26/2019  . Peripheral neuropathy 08/07/2019  . Hyponatremia 05/05/2019  . Subacute osteomyelitis of thoracic spine 04/04/2019  . Fusion of spine of thoracolumbar region 03/12/2019  . Status post thoracic spinal fusion 02/12/2019  . Thoracic spinal stenosis 04/17/2017  . Painful orthopaedic hardware 04/17/2017  . Pseudarthrosis after fusion or arthrodesis  04/17/2017  . 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 04/17/2017  . Low back pain radiating to both legs 01/16/2017  . Esophageal reflux 02/10/2015  . Essential hypertension 06/25/2014  . Asthma 06/25/2014  . 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 Overweight 06/25/2014  . Other abnormal glucose 06/25/2014  . Spinal stenosis of lumbar region without neurogenic claudication 04/09/2014  . Flatback syndrome 04/09/2014  . Chicken pox 10/21/2013  . History of colonic polyps 10/27/2012  . 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 Benign enlargement of prostate 04/23/2012  . ED (erectile dysfunction) 04/23/2012  . Allergic state 10/25/2011  . 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  .  Diabetes mellitus with hyperglycemia   . Insomnia   . Fatigue   . Preventative health care   . DDD (degenerative disc disease), lumbosacral   . Thoracic degenerative disc disease    Past Medical History:  Diagnosis Date  . Acute pharyngitis 10/21/2013  . Allergy    grass and pollen  . Anxiety and depression 10/25/2011  . BPH (benign prostatic hyperplasia) 04/23/2012  . Chicken pox as a child  . DDD (degenerative disc disease)    cervical responds to steroid injections and low back required surgery  . DDD (degenerative disc disease), lumbosacral   . Diabetes mellitus    pre  . Dyspnea   . ED (erectile dysfunction) 04/23/2012  . Elevated BP   . 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 Esophageal reflux 02/10/2015  . Fatigue   . HTN (hypertension)   . Hyperglycemia    preDM   . Hyperlipidemia   . Insomnia   . Low back pain radiating to both legs 01/16/2017  . Measles as a child  . Overweight(278.02)   . Peripheral neuropathy 08/07/2019  . Personal history of colonic polyps 10/27/2012   Follows with [facility]  . Pneumonia    " walking"  . Preventative health care   . Testosterone deficiency 05/23/2012  . Wears glasses     Family History  Problem Relation Age of Onset  . Hypertension Mother   .  Diabetes Mother        type 2  . Cancer Mother 55       breast in remission  . Emphysema Father        smoker  . COPD Father        smoker  . Stroke Father 73       mini  . Heart disease Father   . Hypertension Sister   . Hyperlipidemia Sister   . Scoliosis Sister   . Osteoporosis Sister   . Hypertension Maternal Grandmother   . Scoliosis Maternal Grandmother   . Heart disease Maternal Grandfather   . Heart disease Paternal Grandfather        smoker  . Heart disease Daughter     Past Surgical History:  Procedure Laterality Date  . ATRIAL FIBRILLATION ABLATION N/A 01/27/2022   Procedure: ATRIAL FIBRILLATION ABLATION;  Surgeon: Regan Lemming, MD;  Location: [facility];  Service: Cardiovascular;  Laterality: N/A;  . BACK SURGERY  2012 and 1994   Dr Charlesetta Garibaldi, screws and cage in low back  . BIOPSY  01/09/2020   Procedure: BIOPSY;  Surgeon: Kathi Der, MD;  Location: [facility];  Service: Gastroenterology;;  . BRONCHIAL BIOPSY  11/17/2020   Procedure: BRONCHIAL BIOPSIES;  Surgeon: Tomma Lightning, MD;  Location: [facility];  Service: Endoscopy;;  . BRONCHIAL BRUSHINGS  11/17/2020   Procedure: BRONCHIAL BRUSHINGS;  Surgeon: Tomma Lightning, MD;  Location: [facility];  Service: Endoscopy;;  . BRONCHIAL WASHINGS  11/17/2020   Procedure: BRONCHIAL WASHINGS;  Surgeon: Tomma Lightning, MD;  Location: [facility];  Service: Endoscopy;;  . COLONOSCOPY WITH PROPOFOL N/A 01/09/2020   Procedure: COLONOSCOPY WITH PROPOFOL;  Surgeon: Kathi Der, MD;  Location: [facility];  Service: Gastroenterology;  Laterality: N/A;  . ESOPHAGOGASTRODUODENOSCOPY (EGD) WITH PROPOFOL N/A 01/09/2020   Procedure: ESOPHAGOGASTRODUODENOSCOPY (EGD) WITH PROPOFOL;  Surgeon: Kathi Der, MD;  Location: [facility];  Service: Gastroenterology;  Laterality: N/A;  . EYE SURGERY Bilateral    2016  . HARDWARE REVISION  03/12/2019   REVISION OF HARDWARE THORACIC  TEN-THORACIC ELEVEN  WITH APPLICATION OF ADDITIONAL RODS AND ROD SLEEVES THORACIC EIGHT-THORACIC TWELVE 12-LUMBAR ONE (N/A )  . HEMOSTASIS CONTROL  11/17/2020   Procedure: HEMOSTASIS CONTROL;  Surgeon: Tomma Lightning, MD;  Location: [facility];  Service: Endoscopy;;  . IR US GUIDE BX ASP/DRAIN  11/13/2020  . LAMINECTOMY  02/12/2019   DECOMPRESSIVE LAMINECTOMY THORACIC NINE-THORACIC TEN AND THORACIC TEN-THORACIC ELEVEN, EXTENSION OF THORACOLUMBAR FUSION FROM THORACIC TEN TO THORACIC FIVE, LOCAL BONE GRAFT, ALLOGRAFT AND VIVIGEN (N/A)  . POLYPECTOMY  01/09/2020   Procedure: POLYPECTOMY;  Surgeon: Kathi Der, MD;  Location: [facility];  Service: Gastroenterology;;  . REMOVAL OF RODS AND PEDICLE SCREWS T5 AND T6, EXPLORATION OF FUSION, BIOPSY TRANSPEDICULAR T5 AND T6 (N/A )  09/14/2020  . SPINAL FUSION N/A 02/12/2019   Procedure: DECOMPRESSIVE LAMINECTOMY THORACIC NINE-THORACIC TEN AND THORACIC TEN-THORACIC ELEVEN, EXTENSION OF THORACOLUMBAR FUSION FROM THORACIC TEN TO THORACIC FIVE, LOCAL BONE GRAFT, ALLOGRAFT AND VIVIGEN;  Surgeon: Kerrin Champagne, MD;  Location: [facility];  Service: Orthopedics;  Laterality: N/A;  DECOMPRESSIVE LAMINECTOMY THORACIC NINE-THORACIC TEN AND THORACIC TEN-THORACIC ELEVEN, EXTENSION OF TH  . SPINAL FUSION N/A 03/12/2019   Procedure: REVISION OF HARDWARE THORACIC TEN-THORACIC ELEVEN WITH APPLICATION OF ADDITIONAL RODS AND ROD SLEEVES THORACIC EIGHT-THORACIC TWELVE 12-LUMBAR ONE;  Surgeon: Kerrin Champagne, MD;  Location: [facility] OR;  Service: Orthopedics;  Laterality: N/A;  . TONSILLECTOMY     as child  . torn rotator cuff  2010   right  . VIDEO BRONCHOSCOPY N/A 11/17/2020   Procedure: VIDEO BRONCHOSCOPY WITH FLUORO;  Surgeon: Tomma Lightning, MD;  Location: [facility];  Service: Endoscopy;  Laterality: N/A;   Social History   Occupational History  . Not on file  Tobacco Use  . Smoking status: Former    Packs/day: 1.00    Years: 20.00    Additional pack years: 0.00    Total pack  years: 20.00    Types: Cigarettes    Quit date: 07/18/1989    Years since quitting: 33.3  . Smokeless tobacco: Never  . Tobacco comments:    Former smoker 03/16/22  Vaping Use  . Vaping Use: Never used  Substance and Sexual Activity  . Alcohol use: Yes    Alcohol/week: 2.0 standard drinks of alcohol    Types: 2 Standard drinks or equivalent per week    Comment: couple beers here and there 03/16/22  . Drug use: No  . Sexual activity: Yes    Comment: lives with wife, still working, no dietary restrictions, continues to exercise intermittently

## 2022-11-07 NOTE — Patient Instructions (Signed)
Medication Instructions:   Your physician recommends that you continue on your current medications as directed. Please refer to the Current Medication list given to you today.  *If you need a refill on your cardiac medications before your next appointment, please call your pharmacy*   Lab Work: NONE ORDERED  TODAY    If you have labs (blood work) drawn today and your tests are completely normal, you will receive your results only by: MyChart Message (if you have MyChart) OR A paper copy in the mail If you have any lab test that is abnormal or we need to change your treatment, we will call you to review the results.   Testing/Procedures: NONE ORDERED  TODAY      Follow-Up: At [facility], you and your health needs are our priority.  As part of our continuing mission to provide you with exceptional heart care, we have created designated Provider Care Teams.  These Care Teams include your primary Cardiologist (physician) and Advanced Practice Providers (APPs -  Physician Assistants and Nurse Practitioners) who all work together to provide you with the care you need, when you need it.  We recommend signing up for the patient portal called "MyChart".  Sign up information is provided on this After Visit Summary.  MyChart is used to connect with patients for Virtual Visits (Telemedicine).  Patients are able to view lab/test results, encounter notes, upcoming appointments, etc.  Non-urgent messages can be sent to your provider as well.   To learn more about what you can do with MyChart, go to [email or web address].    Your next appointment:   6 month(s)  Provider:   You may see Will Jorja Loa, MD or one of the following Advanced Practice Providers on your designated Care Team:   Francis Dowse, [place]   Other Instructions

## 2022-11-10 ENCOUNTER — Ambulatory Visit (INDEPENDENT_AMBULATORY_CARE_PROVIDER_SITE_OTHER): Payer: Medicare Other | Admitting: Internal Medicine

## 2022-11-10 ENCOUNTER — Ambulatory Visit: Payer: Medicare Other | Admitting: Internal Medicine

## 2022-11-10 ENCOUNTER — Encounter: Payer: Self-pay | Admitting: Internal Medicine

## 2022-11-10 ENCOUNTER — Telehealth: Payer: Self-pay | Admitting: Internal Medicine

## 2022-11-10 VITALS — BP 142/76 | HR 104 | Temp 97.8°F | Ht 70.0 in | Wt 173.4 lb

## 2022-11-10 DIAGNOSIS — J84112 Idiopathic pulmonary fibrosis: Secondary | ICD-10-CM

## 2022-11-10 DIAGNOSIS — J439 Emphysema, unspecified: Secondary | ICD-10-CM

## 2022-11-10 DIAGNOSIS — Z86711 Personal history of pulmonary embolism: Secondary | ICD-10-CM

## 2022-11-10 DIAGNOSIS — Z5181 Encounter for therapeutic drug level monitoring: Secondary | ICD-10-CM

## 2022-11-10 DIAGNOSIS — R5383 Other fatigue: Secondary | ICD-10-CM

## 2022-11-10 LAB — PULMONARY FUNCTION TEST
DL/VA % pred: 101 %
DL/VA: 3.98 ml/min/mmHg/L
DLCO cor % pred: 69 %
DLCO cor: 16.46 ml/min/mmHg
DLCO unc % pred: 65 %
DLCO unc: 15.5 ml/min/mmHg
FEF 25-75 Pre: 2.01 L/sec
FEF2575-%Pred-Pre: 104 %
FEV1-%Pred-Pre: 88 %
FEV1-Pre: 2.46 L
FEV1FVC-%Pred-Pre: 106 %
FEV6-%Pred-Pre: 88 %
FEV6-Pre: 3.22 L
FEV6FVC-%Pred-Pre: 106 %
FVC-%Pred-Pre: 82 %
FVC-Pre: 3.23 L
Pre FEV1/FVC ratio: 76 %
Pre FEV6/FVC Ratio: 100 %

## 2022-11-10 LAB — HEPATIC FUNCTION PANEL
ALT: 15 U/L (ref 0–53)
AST: 20 U/L (ref 0–37)
Albumin: 4.1 g/dL (ref 3.5–5.2)
Alkaline Phosphatase: 87 U/L (ref 39–117)
Bilirubin, Direct: 0.1 mg/dL (ref 0.0–0.3)
Total Bilirubin: 0.6 mg/dL (ref 0.2–1.2)
Total Protein: 6.7 g/dL (ref 6.0–8.3)

## 2022-11-10 MED ORDER — OFEV 100 MG PO CAPS
100.0000 mg | ORAL_CAPSULE | Freq: Two times a day (BID) | ORAL | 5 refills | Status: DC
Start: 2022-11-10 — End: 2023-02-02

## 2022-11-10 NOTE — Patient Instructions (Addendum)
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  Esbriet intolerant Now on ofev x since late oct 2023 - but stopped ofev early April 2024 due to fatigue with improvement in fatigue  Plan - cotontinue stiolto as before - Continue oxygen with at night and exertion > 600 feet (200 yards) - Continue prednisone   per day   - look at tapering next visit -- RESTART OFEV but at low dose  twice daily  - do  once daily x 1 week with food and then  twice daily with food - Check LFT 11/10/2022  - RE-refer pulmonry rehab if not done last visit - Do spirometry and dlco in 12 weeks - 16 weeks   Fatigue due to nintedanib  Plan - see above   History of pulmonary embolism July 17, 2021 and DVT spring 2022   -Glad you are better  Plan - Coumadin for A Fib and blood clot per PCP  and [facility]  - life long anticoagulation for clots but leave decision to cardiology  - NO  testosterone     Follow-up  - Do spirometry and DLCO in 12 weeks - 16 weeks -Return to see Dr. Marchelle Gearing in 12 - 16  weeks for 30-minute visit  - [simple walking desaturation test and symptom score at follow-up] =

## 2022-11-10 NOTE — Progress Notes (Signed)
Spirometry and DLCO Performed Today.  

## 2022-11-10 NOTE — Progress Notes (Signed)
Referring provider: Elise Benne  HPI: 77 year old male, former smoker (20-pack-year history).  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   Previous [facility] pulmonary encounters:  12/29/20- Dr. Chase Caller Chief Complaint  Patient presents with   Follow-up    PFT  performed 5/24.  Pt states since last visit, he has been doing okay. States he has lost some weight that he has noticed since last visit.    Follow-up drug-induced pneumonitis daptomycin from early May 2022  HPI STONEWALL DOSS 77 y.o. -returns for follow-up.  I personally saw him in the hospital on 11/20/2020.  At that point in time he had been started on Solu-Medrol on 11/18/2020.  When I saw him he was on day 3 of Solu-Medrol and he was needing 3 L nasal cannula oxygen at rest.  At that point in time even with 3 L he was desaturating to 86% just talking.  Put him on prednisone 60 mg/day.  After that he see nurse practitioner x2 at least 1 of which was a telephone visit..  As part of his follow-up on 12/11/2020 he had a CT scan of the chest [3 days after pulmonary function test] and on my personal visualization it showed new onset pneumothorax but significant improvement in infiltrates and inflammation.  He had another follow-up chest x-ray 12/17/2020 in which no pneumothorax reported [similar on 12/15/2020] and pulmonary infiltrates appear improved.  He is now here with his wife  He tells me he is now on 10 mg/day prednisone he just darted that today.  He is 1 week left and after that the prednisone taper ends.  Because of the pneumothorax is physical therapy has been on hold but 4 days ago he was evaluated and apparently his pulse ox was 100% but this was on oxygen.  He continues to use his  oxygen although he is not sure if he is desaturating anymore.  He does feel overall improved compared to 6 weeks ago but still has significant amount of fatigue.  He has significant insomnia which he believes is due to prednisone.  Today's last night was the first night he could sleep when he dropped his prednisone down to 10 mg/day.  He believes his fatigue is multifactorial related to the disease, multiple medications, lack of oxygen, insomnia etc.  02/09/2021 Patient presents today for 1 month follow-up. Since his last visit in June, he has established with cardiology and was dx with afib. He is currently on Xarelto and Digoxin. His wife states that he has had more energy recently. He is scheduled for testosterone injection tomorrow. He gets winded with stairs and heavy exertion. He reports intermittent wheezing. He needs refill of albuterol rescue inhaler. He has weaned himself of oxygen and O2 equipment has been picked up. He has been sleeping better at night. Anxiety has improved, he stopped xanax. Due for follow-up CXR today. 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  02/23/2021 Patient presents today for 2 week follow-up. During his last visit he was given trial Spiriva Respimat d/t dyspnea /wheezing symptoms and moderate obstruction seen on his PFTs. He has been using sample of Spiriva Respimat 2.20mg as prescribed, he occasionally misses a dose for 1-2 days. He states that his wheezing has stopped but he has not noticed a significant difference in  dyspnea symptoms. His ILD scale appears improved from two weeks go. He has having some muscle spasms in his back that started before being placed on LABA.   04/26/2021 -   Chief Complaint  Patient presents with   Follow-up    F/u, trelegy medication concerns    GUENTHER DUNSHEE 77 y.o. -returns for follow-up.  Since I last saw him he seen nurse practitioner 2 times.  Symptom scores for  dyspnea continue to improve.  He was also walked for oxygen desaturation February 04, 2019 did not desaturate.  He says he continues to get better.  He is off prednisone.  Nurse practitioner noticed obstructive physiology on the pulmonary function test.  Put him on inhaler.  Currently is on Trelegy.  He says it helps him tremendously.  He feels it opens up his lungs.  He declined his flu shot today because he will have it after the COVID-vaccine in October or November 2022.   06/24/2021- Interim hx  Patient presents today for acute OV/cough. Patient reports increased shortness of breath since 06/13/21 with associated fatigue, cough and chest tightness. Cough is productive with clear mucus. Symptoms started after Thanksgiving. His wife had the flu at the same time. His cough initially improved while taking mucinex, he stopped since he was starting to feel better. Denies f/c/s, purulent mucus, N/V/D. CAT score 17.   Inpatient consult note 07/17/2022 by Dr. Chase Caller at [facility] This is a 76 year old male with poorly controlled diabetes, hemoglobin A1c 7.4, chronic opioid use for back pain, BPH on Flomax, anxiety and depression, paroxysmal A. fib on metoprolol digoxin and Xarelto .  He had long hx of back infection and was on daptomycin.  Then in  spring 2022 was admitted at [facility] with R pneumonitis considered to be daptomycin related pneumonitis.  Was treated with steroids.  Course complicated by right upper extremity DVT secondary to PICC line for which she was on Xarelto.  He is requiring oxygen at that time.  By the summer 2022 he was off oxygen and able to ambulate even without oxygen.  Course was also complicated later in May 2022 with spontaneous pneumothorax.  By the summer 2022 he was able to come off prednisone.  He also had diagnosed of atrial fibrillation for which he was on digoxin and Xarelto was continued [although originally started for DVT].  Last seen in clinic October  2022 at which time he was off oxygen and off steroids.  He was on Trelegy inhaler for pulmonary function test that back in May 2022 showed obstructive physiology.  He says he was feeling better.  He is only minimally symptomatic.  Plan at that time was to get a repeat high-resolution CT chest done in the spring 2023.   On Thanksgiving 2022 his wife had the flu and he was exposed to this and after that he started having increasing cough and fatigue.  Up until this point he was able to do walking and fishing.  He saw  pulm nurse practitioner 06/24/2021 in the pulmonary clinic.  Was given 8-day prednisone taper for presumed diagnosis of COPD exacerbation.  No antibiotic.  He did see infectious diseases at the same time and for his back infections it was noted, "10 days of linezolid + 8 weeks of tedizolid for his spinal infection and has now been started on dalbavancin infusions every 2 weeks last month as a suppressive regim" (none of which appear to be associated with drug-induced ILD]  He then called into the pulmonary office on 07/13/2021 felt he was desaturating mildly and coughing up green mucus and fevers..  2 COVID home test were negative.  He had a fever of 100.7.  He wanted some prednisone.  We gave him some Omnicef as well and a 5-day prednisone taper but also advised that to report to the ER.  He then presented on 07/15/2021 for admission with acute hypoxic respiratory failure 58% and a history of 1 week of cough and shortness of breath getting worse.  Appears he might not have taken Omnicef.  He had lactic acidosis 2.3 at admission, white count of nearly 15,000 with lymphopenia.  CT angiogram showed acute PE segmental and subsegmental right-sided [despite being on Xarelto] started on IV heparin started on Solu-Medrol and antibiotics . CT also showed L >>> R (opposted from may 2022) GGO. and .  His Doppler ultrasound lower extremities negative for DVT.   On 07/16/2021 he was stable on 4 L oxygen.   His flu, multiplex PCR and MRSA PCR were negative.  Procalcitonin 0.21.  Echo is normal with normal RV.  Then on 12/3 1/22 seen progressively more hypoxemic now requiring 8 L of oxygen.  Briefly required even more but looking comfortable.  When assisted to go to the bathroom even on 8 L he desaturated to 70s he has been afebrile since admission. Trop improved from 200  prior day -> 70, lactic 1.9 with normal BNP. CXR with ?> Worsening LEft sided air space disease and ccm consulted      OV 08/12/2021  Subjective:  [name], male , DOB: Apr 30, 1946 , age 58 y.o. , MRN: [id number] , ADDRESS: [place] [id number] PCP Mackie Pai, PA-C Patient Care Team: Saguier, Iris Pert as PCP - General (Internal Medicine) Berniece Salines, DO as PCP - Cardiology (Cardiology) Keene Breath., MD (Ophthalmology) Jessy Oto, MD as Consulting Physician (Orthopedic Surgery)  This Provider for this visit: Treatment Team:  Attending Provider: Brand Males, MD    08/12/2021 -   Chief Complaint  Patient presents with   Hospitalization Follow-up    Breathing has improved some but not back to baseline yet. He has some chest congestion and occ cough with clear to yellow sputum.    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  He also has paroxysmal A. fib, low testosterone, chronic hyponatremia, chronic opioid intake.,  Chronic Lyrica  HPI LORENCE NAGENGAST 77 y.o. -returns for office follow-up.  After last saw him in the office I did actually see him New Year's Eve in the hospital with acute on chronic hypoxemic respiratory failure at this admission he had a new diagnosis of pulmonary embolism that was deemed Xarelto failure [he was on Xarelto for DVT in his upper extremity from spring 2022.  Today I learned that he is actually taking testosterone for some years.  When he had the DVT in the spring 2022 after the discharge he was  on testosterone.  He says he took testosterone right up to the admission.  He is now getting subcutaneous Lovenox injections.  He wants to go to Coumadin because has been deemed as Xarelto failure.  Also prior to that admission he did have some infectious symptoms with colored sputum.  At the time of that admission he denies any aspiration.  He was discharged home on oxygen.  He was discharged on a prednisone taper.  Also discharged on oxygen [of note  he was on 10 L oxygen during the hospital stay]  At home he continued on oxygen was doing the prednisone taper but got readmitted 08/07/2021 for 1 day and discharged the following day 08/08/2021 following aspiration of a pill.  He says he has never had any aspiration.  He also and his wife also states that speech eval for swallow in the hospital was normal.  He denies any other choking episodes for pills or for food.  He was treated with antibiotics.  This time at discharge he was only on 2 L nasal cannula.  He was given another prednisone taper.  He tells me that on 14 August 2021 he will go to 30 mg/day for 10 days and then 20 mg daily for 10 days and then 10 mg daily for 10 days and stop   Walking desaturation test today on room air: At room air at rest his pulse ox was 95% for at least 20 minutes.  Then we decided to walk him 185 feet x 3 laps on room air:.  He only walked 1 lap and his pulse ox went down to 86% on room air and stopped.  His heart rate went up to 122/min.  His most recent chest x-ray was 5 days ago on 08/07/2021 at the time of the hospitalization second time for aspirating pill: This is improved compared to the admission 1.   His current symptom score is listed below.    OV 09/28/2021  Subjective:  [name], male , DOB: 1945-08-20 , age 56 y.o. , MRN: [id number] , ADDRESS: [place] [id number] PCP Mackie Pai, PA-C Patient Care Team: Saguier, Iris Pert as PCP - General (Internal  Medicine) Berniece Salines, DO as PCP - Cardiology (Cardiology) Keene Breath., MD (Ophthalmology) Jessy Oto, MD as Consulting Physician (Orthopedic Surgery)  This Provider for this visit: Treatment Team:  Attending Provider: Brand Males, MD    09/28/2021 -   Chief Complaint  Patient presents with   Follow-up    Pt states he still becomes SOB with exertion and also has an occasional cough. States the cough happens throughout the day and will get some phlegm up that is clear to yellow tinge.   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 And PE with "ILD Falare" Dec 2022/Jan 2023   He also has paroxysmal A. fib, low testosterone, chronic hyponatremia, chronic opioid intake.,  Chronic Lyrica  HPI Luis Brown 77 y.o. -returns for follow-up.  Since his last visit he has continued on prednisone 10 mg/day.  He continues to have shortness of breath.  In fact this is unchanged.  He does tell me though starting spring season he has had allergic rhinitis.  Wife states he has seasonal allergies for many years.  He is also having worsening cough.  He took a antihistamine and is somewhat better.  He continues to schedule Stiolto.  He has upcoming atrial fibrillation ablation end of March 2023.  Also in mid February 2023 had another CT angiogram chest that ruled out pulmonary embolism [done while on anticoagulation].  I personally visualized the CT chest and compared to late December 2023/early January 2023 the findings are better.  There might be some residual scarring and potential emphysema.  We walked him today and his walking desaturation test held up.  He did not desaturate.  There is a huge improvement.  Of note he was supposed have pulmonary function test today but  this was not performed.  He does not know why.  Apologized for any missteps.    For his pulmonary embolism: It appears that he might not be taking his testosterone shots  anymore.  Regarding seasonal allergies: He has not had work-up for this.  But currently it is active.     '@Patient'$  ID: Luis Brown, male    DOB: [birth year], 77 y.o.   MRN: [id number]  Chief Complaint  Patient presents with   Follow-up    C/o increased sob x 2 wks., cough-yellow in am    Referring provider: Elise Benne  HPI: 77 year old male, former smoker (20-pack-year history).  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   Previous [facility] pulmonary encounters:  12/29/20- Dr. Chase Caller Chief Complaint  Patient presents with   Follow-up    PFT  performed 5/24.  Pt states since last visit, he has been doing okay. States he has lost some weight that he has noticed since last visit.    Follow-up drug-induced pneumonitis daptomycin from early May 2022  HPI Luis Brown 77 y.o. -returns for follow-up.  I personally saw him in the hospital on 11/20/2020.  At that point in time he had been started on Solu-Medrol on 11/18/2020.  When I saw him he was on day 3 of Solu-Medrol and he was needing 3 L nasal cannula oxygen at rest.  At that point in time even with 3 L he was desaturating to 86% just talking.  Put him on prednisone 60 mg/day.  After that he see nurse practitioner x2 at least 1 of which was a telephone visit..  As part of his follow-up on 12/11/2020 he had a CT scan of the chest [3 days after pulmonary function test] and on my personal visualization it showed new onset pneumothorax but significant improvement in infiltrates and inflammation.  He had another follow-up chest x-ray 12/17/2020 in which no pneumothorax reported [similar on 12/15/2020] and pulmonary infiltrates appear improved.  He is now here with his wife  He tells me he is now on  10 mg/day prednisone he just darted that today.  He is 1 week left and after that the prednisone taper ends.  Because of the pneumothorax is physical therapy has been on hold but 4 days ago he was evaluated and apparently his pulse ox was 100% but this was on oxygen.  He continues to use his oxygen although he is not sure if he is desaturating anymore.  He does feel overall improved compared to 6 weeks ago but still has significant amount of fatigue.  He has significant insomnia which he believes is due to prednisone.  Today's last night was the first night he could sleep when he dropped his prednisone down to 10 mg/day.  He believes his fatigue is multifactorial related to the disease, multiple medications, lack of oxygen, insomnia etc.  02/09/2021 Patient presents today for 1 month follow-up. Since his last visit in June, he has established with cardiology and was dx with afib. He is currently on Xarelto and Digoxin. His wife states that he has had more energy recently. He is scheduled for testosterone injection tomorrow. He gets winded with stairs and heavy exertion. He reports intermittent wheezing. He needs refill of albuterol rescue inhaler. He has weaned himself of oxygen and O2 equipment has been picked up. He has been  sleeping better at night. Anxiety has improved, he stopped xanax. Due for follow-up CXR today. 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  02/23/2021 Patient presents today for 2 week follow-up. During his last visit he was given trial Spiriva Respimat d/t dyspnea /wheezing symptoms and moderate obstruction seen on his PFTs. He has been using sample of Spiriva Respimat 2.80mg as prescribed, he occasionally misses a dose for 1-2 days. He states that his wheezing has stopped but he has not noticed a significant difference in dyspnea symptoms. His ILD scale appears improved from two weeks go. He has having some muscle spasms in his  back that started before being placed on LABA.   04/26/2021 -   Chief Complaint  Patient presents with   Follow-up    F/u, trelegy medication concerns    CBRACH BIRDSALL89y.o. -returns for follow-up.  Since I last saw him he seen nurse practitioner 2 times.  Symptom scores for dyspnea continue to improve.  He was also walked for oxygen desaturation February 04, 2019 did not desaturate.  He says he continues to get better.  He is off prednisone.  Nurse practitioner noticed obstructive physiology on the pulmonary function test.  Put him on inhaler.  Currently is on Trelegy.  He says it helps him tremendously.  He feels it opens up his lungs.  He declined his flu shot today because he will have it after the COVID-vaccine in October or November 2022.  06/24/2021 Patient presents today for acute OV/cough. Patient reports increased shortness of breath since 06/13/21 with associated fatigue, cough and chest tightness. Cough is productive with clear mucus. Symptoms started after Thanksgiving. His wife had the flu at the same time. His cough initially improved while taking mucinex, he stopped since he was starting to feel better. Denies f/c/s, purulent mucus, N/V/D. CAT score 17.    09/28/2021 -   Chief Complaint  Patient presents with   Follow-up    Pt states he still becomes SOB with exertion and also has an occasional cough. States the cough happens throughout the day and will get some phlegm up that is clear to yellow tinge.   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 And PE with "ILD Falare" Dec 2022/Jan 2023   He also has paroxysmal A. fib, low testosterone, chronic hyponatremia, chronic opioid intake.,  Chronic Lyrica  HPI Luis SHRUM89y.o. -returns for follow-up.  Since his last visit he has continued on prednisone 10 mg/day.  He continues to have shortness of breath.  In fact this is unchanged.  He does tell me though starting spring season he  has had allergic rhinitis.  Wife states he has seasonal allergies for many years.  He is also having worsening cough.  He took a antihistamine and is somewhat better.  He continues to schedule Stiolto.  He has upcoming atrial fibrillation ablation end of March 2023.  Also in mid February 2023 had another CT angiogram chest that ruled out pulmonary embolism [done while on anticoagulation].  I personally visualized the CT chest and compared to late December 2023/early January 2023 the findings are better.  There might be some residual scarring and potential emphysema.  We walked him today and his walking desaturation test held up.  He did not desaturate.  There is a huge improvement.  Of note he was supposed have pulmonary function test today but this was not performed.  He does  not know why.  Apologized for any missteps.    For his pulmonary embolism: It appears that he might not be taking his testosterone shots anymore.  Regarding seasonal allergies: He has not had work-up for this.  But currently it is active.   12/30/2021 Patient presents today for a 3 month follow-up with PFTs. Patient needs simple walk test, symptom scale and FENO today.  Patient is maintained on Stiolto. Has not required albuterol. He has chronic shortness of breath and fatigue symptoms which are significantly better since last visit. No cough. He remains on '10mg'$  prednisone daily. No oxygen reqiuirments during daytime. Using 1L oxygen at bedtime.  HRCT on 12/28/2021 showed regression of subpleural and basilar predominant fibrotic interstitial lung disease compared to May 2022.  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 PFTs showed improvement in FEV1 but decreased diffusion capacity. Eosinophil absolute was 100 and March.  Respiratory allergy panel was negative. FENO today was normal. Discussed starting antifibrtoic to slow progression of pulmonary fibrosis.   02/10/2022 Patient presents today for 1 month follow-up/ new antifibrotic start.  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 He started Esbriet 1.5 weeks ago d/t gout flare. He is tolerating medication well, currently on 2 tablets three times a day. No GI symptoms. Dyspnea appears some better overall. No acute respiratory symptoms. No active cough, mucinex-dm helps when needed. He took 3/4 mild walk the other day without issues. Continues on Stiolto, prn albuterol and '5mg'$  prednisone daily. Overnight oximetry test on 01/24/22 showed that patient spent 5 min with SPO2 less than 88%. SPO2 low 85% and baseline 93%. Patient started on 1L supplemental oxygen at bedtime.   03/24/2022- Interim hx  Patient presents today 2 month follow-up/ ILD. 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 Patient had spirometry with DLCO testing today. He was unable to tolerate antifibrotic medication Esbriet d/t nausea, headache and muscle aches. He was never able to get to full dose. He had labs checked with PCP yesterday, LFTs were normal. Sodium was low and needs recheck in 1 week   He reports increased sob symptoms last couple of weeks. He has associated wheezing and morning sputum production. He takes mucinex first thing in the morning which helps. He in on '5mg'$  prednisone daily. Spirometry showed decline in FEV1 and diffusion capacity compared to June.   Pulmonary testing 03/24/2022 Spirometry>> FVC 2.88 (69%), FEV1 2.23 (75%), ratio 78, DLCOcor 14.05 (56%)     hx OV 05/19/2022  Subjective:  [name], male , DOB: [birth year] , age 54 y.o. , MRN: [id number] , ADDRESS: [place] [id number] PCP [name], MD Patient Care Team: [name], MD as PCP - General (Family Medicine) Berniece Salines, DO as PCP - Cardiology (Cardiology) Constance Haw, MD as PCP - Electrophysiology (Cardiology) Keene Breath., MD (Ophthalmology) Jessy Oto, MD as Consulting Physician (Orthopedic Surgery)  This Provider for this visit: Treatment Team:  Attending Provider: Brand Males, MD   05/19/2022 -   Chief Complaint  Patient presents with   Follow-up    Pt states that he still has problems with SOB which is about the same since last visit.     HPI Luis Brown 78 y.o. -returns for follow-up.  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  It is now looking more more that he has IPF but if  not IPF progressive phenotype.  The UIP pattern is a bad prognostic sign.  He is feeling stable but his pulmonary function test today shows decline.  We tried him on pirfenidone but he did not tolerated.  He stated given body aches.  Now for the last 1 week he is on full dose nintedanib.  He is having some mild fatigue but no diarrhea.  Overall he feels he is tolerating the medicine well.  Last liver function test end of September 2023 before he went on the Internet.  He will have liver function test today.  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  He is also still on testosterone but he is on anticoagulation.  I did warn him about the risks of pulmonary embolism with testosterone he is already had pulm embolism.  He has had his RSV and flu vaccine and he plans to have his COVID mRNA booster.    06/20/2022- interim hx  Patient presents today for follow-up after starting anti-fibrotic medication.  He feels his breathing  is some worse over the last 4-5 months. When walking at his own pace he does all right.  Increased dyspnea symptoms with quick exertion.  Overall he does okay shopping but takes this time.  Not coughing much, prn mucinex helps loosen congestion. Mainly clears his throat d/t PND. He was started on OFEV in early November, currently taking 150mg  twice daily. He is tolerating medication except for fatigue. Fatigue is worse on some days.  Not sleeping well at night requiring naps.  He has had previous sleep study which was  negative for OSA. Maintained on Trelegy and wears oxygen at bedtime. He is taking Prednisone 5mg  daily. Liver function within normal limits in November. He is on BuSpar for anxiety which is helping. He takes oxycodone 6 times daily for chronic back pain, following with pain management. He is scheduled for CT chest on 07/13/22 and PFTs in January to re-assess lung function. He has an overview with Dr. Marchelle Gearing on 08/02/21.       OV 08/02/2022  Subjective:  Patient ID: [name], male , DOB: 01/30/1946 , age 47 y.o. , MRN: [id number] , ADDRESS: [address] [place] [place] [address] PCP Bradd Canary, MD Patient Care Team: Bradd Canary, MD as PCP - General (Family Medicine) Thomasene Ripple, DO as PCP - Cardiology (Cardiology) Regan Lemming, MD as PCP - Electrophysiology (Cardiology) Marzella Schlein., MD (Ophthalmology) Kerrin Champagne, MD (Inactive) as Consulting Physician (Orthopedic Surgery)  This Provider for this visit: Treatment Team:  Attending Provider: Kalman Shan, MD    08/02/2022 -   Chief Complaint  Patient presents with   Follow-up    PFT     HPI [name] 77 y.o. -returns for follow-up.  Continue on prednisone 5 mg/day.  He is also on nintedanib.  He and his wife report nintedanib is $3100 per month and is quite expensive.  They are not able to get a lower co-pay.  Last it was $600 per month co-pay.  He feels fatigue because of nintedanib.  It is mild to moderate.  He is willing to pulm rehabilitation.  He does have a long back scar and has had previous back surgery but he feels he can handle pulmonary rehabilitation.  He is clear that his fatigue is because of the nintedanib but he does not want to stop it.  We reviewed his PFTs and CT  scan and shows recent stability.  Therefore he is pleased about it and does not want to stop the nintedanib.  He is willing to put up with the fatigue.  He was wondering if his shortness of breath is getting  worse because he thought because of the shortness of breath and ongoing cough and fatigue that he might be worse.  But his symptom score shows stability.  [See below]  Of note his wife is also here with him.  She also is worried about his high co-pay.  She is also reporting independently that his fatigue is related to nintedanib.  She also believes that he can do pulm rehabilitation and in fact was pleased that offered it.   OV 11/10/2022  Subjective:  [name], male , DOB: 12-25-1945 , age 58 y.o. , MRN: [id number] , ADDRESS: 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 fib, low testosterone, chronic hyponatremia, chronic opioid intake.,  Chronic Lyrica  11/10/2022 -   Chief Complaint  Patient presents with   Follow-up    Review PFT today.  Stopped Ofev x 3 weeks due to increased fatigue    Last Weight  Most recent update: 11/10/2022  1:11 PM    Weight  78.7 kg (173 lb 6.4 oz)             HPI Luis Brown 77 y.o. -returns for follow-up.  Presents with his wife.  His dyspnea itself is stable.  Pulmonary function test is improved/stable.  He stopped taking nintedanib 3 weeks ago because of severe fatigue ECOG 4.  Was lying  in bed all the time.  Now after stopping the Internet his fatigue is improved and is back to baseline.  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  We discussed low-dose nintedanib about taking 100 mg once daily for a week and then escalating to twice daily.  He is willing to do this.  We took a shared decision making to commit to this approach.  He will have liver function test today.   At the last visit we discussed pulmonary rehabilitation but he does not have an appointment there.  Will make a rereferral today.  SYMPTOM SCALE - ILD 08/12/2021 09/28/2021  12/30/2021  02/10/2022  03/24/2022  05/19/2022  06/20/2022  08/02/2022  11/10/2022   Current weight   177lb  182 lbs 187# ofev x 1 week Ofev 150 bid Ofev 150 bid 173#. OFf ofev x 2 weeks due to fagite  O2 use 2L [place] but at rest he was 95%  RA RA  RA  ra RA    Shortness of Breath 0 -> 5 scale with 5 being worst (score 6 If unable  to do)          At rest 1 1 0 0 0 1 2 1 1   Simple tasks - showers, clothes change, eating, shaving 3 2 0 0 0 1 2 1 1   Household (dishes, doing bed, laundry) 3 2 1  0 3 1 2  1.5 2  Shopping xno 3 1 0 3 2 2  1.5 3  Walking level at own pace 2 1.5 0 0 0 2 1 1 2   Walking up Stairs 3 2.5 2 1 4 3 2  1.5 2  Total (30-36) Dyspnea Score 12 12 4 1 10 10 11  7/5 12  How bad is your cough? Not often 3.5 Not often 0 1 0 2 0 1  How bad is your fatigue A lot 2 2 2.5 1 2 3  frequent 2  How bad is nausea 0 0 0 0 0 0 0 0 0  How bad is vomiting?  0 0 0 0 0 0 0 0 0  How bad is diarrhea? 0 0 0 0 0 0 0 0 0  How bad is anxiety? some 1 0 0 0 3 3.5 - 4 On Buspar 1 3  How bad is depression A little 0 0.5 0 0 3 1 1 1   Any chronic pain - if so where and how bad x  Back  Back  Back  Back and neuopathy Back pain - oxycodone 5mg  / following with pain management  Controleld back pain on med x   Simple office walk 185 feet x  3 laps goal with forehead probe 08/12/2021  09/28/2021   12/30/2021  06/20/2022  11/10/2022   O2 used ra ra RA RA   Number laps completed 1  3 3  laps    Comments about pace steady  Steady Steady    Resting Pulse Ox/HR 95% and 89/min 99% and HR 90 97%; HR 97  100%; HR 77   Final Pulse Ox/HR 86% and 122/min 94% and HR 114 98%; HR 120 100%; 76%   Desaturated </= 88% no no No No   Desaturated <= 3% points yes Yes, 5 ponts No No   Got Tachycardic >/= 90/min yes yes Yes No   Symptoms at end of test dyspnea Mild does None None   Miscellaneous comments x improve x x      CT Chest data  XR Pelvis 1-2 Views  Result Date: 11/07/2022 Radiographs of his hip do show some degenerative changes with sclerotic and joint space narrowing.  No acute fractures noted     PFT     Latest Ref Rng & Units 11/10/2022   11:18 AM 08/02/2022   12:08 PM 03/24/2022    9:14 AM 12/30/2021   10:56 AM 12/08/2020   12:01 PM  PFT Results  FVC-Pre L 3.23  P 3.12  2.88  3.27  2.97   FVC-Predicted Pre % 82  P 75  69  79  71   FVC-Post L     2.96   FVC-Predicted Post %     71   Pre FEV1/FVC % % 76  P 78  78  76  66   Post FEV1/FCV % %     70   FEV1-Pre L 2.46  P 2.43  2.23  2.49  1.97   FEV1-Predicted Pre % 88  P 82  75  84  65   FEV1-Post L     2.07   DLCO uncorrected ml/min/mmHg 15.50  P 14.29  12.95  15.89  18.01   DLCO UNC% % 65  P 57  52  64  72   DLCO corrected ml/min/mmHg 16.46  P 14.29  14.05  17.38  20.52   DLCO COR %Predicted % 69  P 57  56  70  82   DLVA Predicted % 101  P 87  84  91  120   TLC L     5.04   TLC % Predicted %     71   RV % Predicted %     95     P Preliminary result       has a past medical history of Acute pharyngitis (10/21/2013), Allergy, Anxiety and depression (10/25/2011), BPH (benign prostatic hyperplasia) (04/23/2012), Chicken pox (as a child), DDD (degenerative disc disease), DDD (degenerative disc disease), lumbosacral, Diabetes mellitus, Dyspnea, ED (erectile dysfunction) (04/23/2012), Elevated BP, 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   reports that he quit smoking about 33 years ago. His smoking use included cigarettes. He has a 20.00 pack-year smoking history. He has never used smokeless tobacco.  Past Surgical History:  Procedure Laterality Date   ATRIAL FIBRILLATION ABLATION N/A 01/27/2022   Procedure: ATRIAL FIBRILLATION ABLATION;  Surgeon: Regan Lemming, MD;  Location: [facility];  Service: Cardiovascular;  Laterality: N/A;   BACK SURGERY  2012 and 1994   Dr Charlesetta Garibaldi, screws and cage in low back   BIOPSY  01/09/2020   Procedure: BIOPSY;  Surgeon: Kathi Der, MD;  Location: [facility];  Service: Gastroenterology;;   BRONCHIAL BIOPSY  11/17/2020   Procedure: BRONCHIAL BIOPSIES;  Surgeon: Tomma Lightning, MD;  Location: [facility];  Service: Endoscopy;;   BRONCHIAL BRUSHINGS  11/17/2020   Procedure: BRONCHIAL BRUSHINGS;  Surgeon: Tomma Lightning, MD;  Location: [facility];  Service: Endoscopy;;   BRONCHIAL WASHINGS  11/17/2020   Procedure: BRONCHIAL WASHINGS;  Surgeon: Tomma Lightning, MD;  Location: [facility];  Service: Endoscopy;;   COLONOSCOPY WITH PROPOFOL N/A 01/09/2020   Procedure: COLONOSCOPY WITH PROPOFOL;  Surgeon: Kathi Der, MD;  Location: [facility];  Service: Gastroenterology;  Laterality: N/A;   ESOPHAGOGASTRODUODENOSCOPY (EGD) WITH PROPOFOL N/A 01/09/2020   Procedure: ESOPHAGOGASTRODUODENOSCOPY (EGD) WITH PROPOFOL;  Surgeon: Kathi Der, MD;  Location: [facility];  Service: Gastroenterology;  Laterality: N/A;   EYE SURGERY Bilateral    2016   HARDWARE REVISION  03/12/2019   REVISION OF HARDWARE  THORACIC TEN-THORACIC ELEVEN WITH APPLICATION OF ADDITIONAL RODS AND ROD SLEEVES THORACIC EIGHT-THORACIC TWELVE 12-LUMBAR ONE (N/A )   HEMOSTASIS CONTROL  11/17/2020   Procedure: HEMOSTASIS CONTROL;  Surgeon: Tomma Lightning, MD;  Location: [facility];  Service: Endoscopy;;   IR US GUIDE BX ASP/DRAIN  11/13/2020   LAMINECTOMY  02/12/2019   DECOMPRESSIVE LAMINECTOMY THORACIC NINE-THORACIC TEN AND THORACIC TEN-THORACIC ELEVEN, EXTENSION OF THORACOLUMBAR FUSION FROM THORACIC TEN TO THORACIC FIVE, LOCAL BONE GRAFT, ALLOGRAFT AND VIVIGEN (N/A)   POLYPECTOMY  01/09/2020   Procedure: POLYPECTOMY;  Surgeon: Kathi Der, MD;  Location: [facility];  Service: Gastroenterology;;   REMOVAL OF RODS AND PEDICLE SCREWS T5 AND T6, EXPLORATION OF FUSION, BIOPSY TRANSPEDICULAR T5 AND T6 (N/A )  09/14/2020   SPINAL FUSION N/A 02/12/2019   Procedure: DECOMPRESSIVE LAMINECTOMY THORACIC NINE-THORACIC TEN AND THORACIC TEN-THORACIC ELEVEN, EXTENSION  OF THORACOLUMBAR FUSION FROM THORACIC TEN TO THORACIC FIVE, LOCAL BONE GRAFT, ALLOGRAFT AND VIVIGEN;  Surgeon: Kerrin Champagne, MD;  Location: [facility];  Service: Orthopedics;  Laterality: N/A;  DECOMPRESSIVE LAMINECTOMY THORACIC NINE-THORACIC TEN AND THORACIC TEN-THORACIC ELEVEN, EXTENSION OF TH   SPINAL FUSION N/A 03/12/2019   Procedure: REVISION OF HARDWARE THORACIC TEN-THORACIC ELEVEN WITH APPLICATION OF ADDITIONAL RODS AND ROD SLEEVES THORACIC EIGHT-THORACIC TWELVE 12-LUMBAR ONE;  Surgeon: Kerrin Champagne, MD;  Location: [facility];  Service: Orthopedics;  Laterality: N/A;   TONSILLECTOMY     as child   torn rotator cuff  2010   right   VIDEO BRONCHOSCOPY N/A 11/17/2020   Procedure: VIDEO BRONCHOSCOPY WITH FLUORO;  Surgeon: Tomma Lightning, MD;  Location: [facility];  Service: Endoscopy;  Laterality: N/A;    Allergies  Allergen Reactions   Daptomycin     Drug induced pneumonia    Diltiazem Other (See Comments)    Passed out    Immunization History  Administered  Date(s) Administered   COVID-19, mRNA, vaccine(Comirnaty)12 years and older 05/23/2022   Fluad Quad(high Dose 65+) 04/05/2019, 06/29/2020, 05/18/2021, 04/22/2022   Influenza Split 04/23/2012   Influenza Whole 02/16/2011   Influenza, High Dose Seasonal PF 05/06/2013, 03/25/2016, 05/05/2017, 06/07/2018   PFIZER(Purple Top)SARS-COV-2 Vaccination 08/23/2019, 09/13/2019, 06/07/2020   Pfizer Covid-19 Vaccine Bivalent Booster 62yrs & up 04/29/2021   Pneumococcal Conjugate-13 03/25/2016   Pneumococcal Polysaccharide-23 01/20/2009, 10/19/2011   Respiratory Syncytial Virus Vaccine,Recomb Aduvanted(Arexvy) 04/09/2022   Td 07/09/2003, 07/18/2008   Tdap 07/20/2011   Zoster, Live 04/27/2016    Family History  Problem Relation Age of Onset   Hypertension Mother    Diabetes Mother        type 2   Cancer Mother 39       breast in remission   Emphysema Father        smoker   COPD Father        smoker   Stroke Father 70       mini   Heart disease Father    Hypertension Sister    Hyperlipidemia Sister    Scoliosis Sister    Osteoporosis Sister    Hypertension Maternal Grandmother    Scoliosis Maternal Grandmother    Heart disease Maternal Grandfather    Heart disease Paternal Grandfather        smoker   Heart disease Daughter      Current Outpatient Medications:    albuterol (PROVENTIL HFA) 108 (90 Base) MCG/ACT inhaler, TAKE 2 PUFFS BY MOUTH EVERY 6 HOURS AS NEEDED FOR WHEEZE OR SHORTNESS OF BREATH, Disp: 8 g, Rfl: 5   albuterol (VENTOLIN HFA) 108 (90 Base) MCG/ACT inhaler, Inhale 2 puffs into the lungs every 6 (six) hours as needed., Disp: 18 g, Rfl: 0   atorvastatin (LIPITOR) 10 MG tablet, TAKE 1 TABLET BY MOUTH EVERY DAY, Disp: 90 tablet, Rfl: 1   busPIRone (BUSPAR) 15 MG tablet, Take 1 tablet (15 mg total) by mouth 2 (two) times daily., Disp: 180 tablet, Rfl: 0   dextromethorphan-guaiFENesin (MUCINEX DM) 30-600 MG 12hr tablet, Take 1 tablet by mouth 2 (two) times daily., Disp: , Rfl:     fluticasone (FLONASE) 50 MCG/ACT nasal spray, SPRAY 2 SPRAYS INTO EACH NOSTRIL EVERY DAY, Disp: 48 mL, Rfl: 1   Fluticasone-Umeclidin-Vilant (TRELEGY ELLIPTA) 200-62.5-25 MCG/ACT AEPB, Inhale 1 puff into the lungs daily., Disp: 60 each, Rfl: 5   glucose blood (CONTOUR NEXT TEST) test strip, Check blood sugar once daily, Disp: 100  each, Rfl: 12   insulin glargine (LANTUS SOLOSTAR) 100 UNIT/ML Solostar Pen, Inject 24 Units into the skin daily., Disp: 100 mL, Rfl: 2   insulin lispro (HUMALOG) 100 UNIT/ML injection, Inject 0.03 mLs (3 Units total) into the skin 3 (three) times daily with meals., Disp: 100 mL, Rfl: 1   Insulin Pen Needle (B-D UF III MINI PEN NEEDLES) 31G X 5 MM MISC, USE WITH LANTUS, Disp: 100 each, Rfl: 3   JANUVIA 50 MG tablet, TAKE 1 TABLET BY MOUTH EVERY DAY, Disp: 30 tablet, Rfl: 3   metFORMIN (GLUCOPHAGE) 1000 MG tablet, TAKE 1 TABLET (1,000 MG TOTAL) BY MOUTH TWICE A DAY WITH FOOD, Disp: 180 tablet, Rfl: 0   oxyCODONE-acetaminophen (PERCOCET/ROXICET) 5-325 MG tablet, Take 1 tablet by mouth every 4 (four) hours as needed for pain- frequency change, Disp: 180 tablet, Rfl: 0   pantoprazole (PROTONIX) 40 MG tablet, TAKE 1 TABLET BY MOUTH TWICE A DAY BEFORE A MEAL, Disp: 180 tablet, Rfl: 1   predniSONE (DELTASONE) 10 MG tablet, TAKE 1 TABLET (10 MG TOTAL) BY MOUTH DAILY WITH BREAKFAST., Disp: 30 tablet, Rfl: 3   pregabalin (LYRICA) 75 MG capsule, TAKE 1 CAPSULE BY MOUTH TWICE A DAY (Patient taking differently: Take 75 mg by mouth 2 (two) times daily.), Disp: 60 capsule, Rfl: 3   tamsulosin (FLOMAX) 0.4 MG CAPS capsule, TAKE 2 CAPSULES BY MOUTH EVERY DAY, Disp: 180 capsule, Rfl: 3   tiZANidine (ZANAFLEX) 4 MG tablet, Take 1 and 1/2 tablets (6 mg total) by mouth every 4 hours., Disp: 180 tablet, Rfl: 2   triamcinolone cream (KENALOG) 0.1 %, Apply a small amount to affected area twice a day as needed, Disp: 30 g, Rfl: 1   venlafaxine XR (EFFEXOR-XR) 150 MG 24 hr capsule, TAKE 1 CAPSULE  BY MOUTH DAILY WITH BREAKFAST., Disp: 90 capsule, Rfl: 0   Vitamin D, Ergocalciferol, (DRISDOL) 1.25 MG (50000 UNIT) CAPS capsule, Take 1 capsule (50,000 Units total) by mouth every 7 (seven) days., Disp: 8 capsule, Rfl: 0   warfarin (COUMADIN) 5 MG tablet, TAKE 1 TO 2 TABLETS DAILY OR AS PRESCRIBED BY COUMADIN CLINIC, Disp: 130 tablet, Rfl: 1   zolpidem (AMBIEN) 10 MG tablet, Take 0.5-1 tablets (5-10 mg total) by mouth at bedtime as needed for sleep., Disp: 15 tablet, Rfl: 3      Objective:   Vitals:   11/10/22 1310  BP: (!) 142/76  Pulse: (!) 104  Temp: 97.8 F (36.6 C)  TempSrc: Oral  SpO2: 97%  Weight: 173 lb 6.4 oz (78.7 kg)  Height: 5\' 10"  (1.778 m)    Estimated body mass index is 24.88 kg/m as calculated from the following:   Height as of this encounter: 5\' 10"  (1.778 m).   Weight as of this encounter: 173 lb 6.4 oz (78.7 kg).  @WEIGHTCHANGE @  [employer]   11/10/22 1310  Weight: 173 lb 6.4 oz (78.7 kg)     Physical ExamGeneral: No distress. Looks well Neuro: Alert and Oriented x 3. GCS 15. Speech normal Psych: Pleasant Resp:  Barrel Chest - no.  Wheeze - no, Crackles - YES BASE, No overt respiratory distress CVS: Normal heart sounds. Murmurs - no Ext: Stigmata of Connective Tissue Disease - no MS: LONG SCAR In BACK HEENT: Normal upper airway. PEERL +. No post nasal drip        Assessment:       ICD-10-CM   1. IPF (idiopathic pulmonary fibrosis)  J84.112 Hepatic function panel  2. Other fatigue  R53.83     3. 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 Marchelle Gearing in 12 - 16  weeks for 30-minute visit  - [simple walking desaturation test and symptom score at follow-up] =    SIGNATURE    Dr. Kalman Shan, M.D., F.C.C.P,  Pulmonary and Critical Care Medicine Staff Physician, [facility] Director - Interstitial Lung Disease  Program  Pulmonary Fibrosis [facility] at [facility] [place], [place], [address]  Pager: [phone], If no answer or between  15:00h - 7:00h: call [phone] Telephone: [phone]  1:34 PM 11/10/2022

## 2022-11-10 NOTE — Telephone Encounter (Signed)
Rx for Ofev  twice daily sent to [facility] specialty [facility].  MyChart message sent to patient regarding this  Chesley Mires, PharmD, MPH, BCPS, CPP Clinical Pharmacist (Rheumatology and Pulmonology)

## 2022-11-10 NOTE — Telephone Encounter (Signed)
Rx team  Luis Brown had severe fatigue with full dose nintedanib.  He quit it 3 weeks ago and his fatigue is improved.  He is willing to try nintedanib again.  We took a shared decision making to do low-dose 100 mg once daily for 1 week and then 100 mg twice daily.  Please facilitate prescription of the low-dose nintedanib

## 2022-11-10 NOTE — Patient Instructions (Signed)
Spirometry and DLCO Performed Today.  

## 2022-11-11 ENCOUNTER — Other Ambulatory Visit: Payer: Self-pay | Admitting: Medical

## 2022-11-14 ENCOUNTER — Other Ambulatory Visit: Payer: Self-pay | Admitting: Family Medicine

## 2022-11-14 ENCOUNTER — Encounter: Payer: Self-pay | Admitting: Medical

## 2022-11-14 ENCOUNTER — Other Ambulatory Visit: Payer: Self-pay | Admitting: Medical

## 2022-11-14 MED ORDER — MECLIZINE HCL 12.5 MG PO TABS: 12.5000 mg | ORAL_TABLET | Freq: Three times a day (TID) | ORAL | 2 refills | Status: DC | PRN

## 2022-11-14 NOTE — Telephone Encounter (Signed)
Luis Brown was sent in today , I dont see Meclizine on pt's med list

## 2022-11-17 ENCOUNTER — Encounter (HOSPITAL_COMMUNITY): Payer: Self-pay

## 2022-11-17 ENCOUNTER — Telehealth (HOSPITAL_COMMUNITY): Payer: Self-pay

## 2022-11-17 NOTE — Telephone Encounter (Signed)
Per pt, pt will call back in regards to pulmonary rehab. Closed referral.

## 2022-11-18 ENCOUNTER — Other Ambulatory Visit: Payer: Self-pay | Admitting: Family

## 2022-11-20 ENCOUNTER — Encounter: Payer: Self-pay | Admitting: Internal Medicine

## 2022-11-20 ENCOUNTER — Encounter: Payer: Self-pay | Admitting: Family Medicine

## 2022-11-21 ENCOUNTER — Ambulatory Visit: Payer: Medicare Other

## 2022-11-21 ENCOUNTER — Other Ambulatory Visit: Payer: Self-pay | Admitting: Family Medicine

## 2022-11-21 MED ORDER — ZOLPIDEM TARTRATE 10 MG PO TABS: 5.0000 mg | ORAL_TABLET | Freq: Every evening | ORAL | 3 refills | Status: DC | PRN

## 2022-11-22 DIAGNOSIS — R0602 Shortness of breath: Secondary | ICD-10-CM | POA: Diagnosis not present

## 2022-11-22 DIAGNOSIS — J9601 Acute respiratory failure with hypoxia: Secondary | ICD-10-CM | POA: Diagnosis not present

## 2022-11-23 ENCOUNTER — Ambulatory Visit: Payer: Medicare Other | Attending: Cardiology

## 2022-11-23 DIAGNOSIS — I480 Paroxysmal atrial fibrillation: Secondary | ICD-10-CM

## 2022-11-23 LAB — POCT INR: INR: 1.6 — AB (ref 2.0–3.0)

## 2022-11-23 NOTE — Patient Instructions (Signed)
Description   Take 1.5 tablets today and 1.5 tablets tomorrow and then continue taking warfarin 1 tablet daily except 1.5 tablets on Tuesdays and Saturdays.  Stay consistent with greens/protein drinks each week.  Recheck INR in 2 weeks.  Coumadin Clinic# [phone]

## 2022-11-24 ENCOUNTER — Ambulatory Visit: Payer: Medicare Other | Admitting: Physician Assistant

## 2022-11-24 ENCOUNTER — Encounter: Payer: Self-pay | Admitting: Physician Assistant

## 2022-11-24 DIAGNOSIS — M25551 Pain in right hip: Secondary | ICD-10-CM | POA: Diagnosis not present

## 2022-11-24 NOTE — Progress Notes (Signed)
Office Visit Note   Patient: Luis Brown           Date of Birth: Feb 03, 1946           MRN: [id number] Visit Date: 11/24/2022              Requested by: Bradd Canary, MD [address] [place],  [place] [address] PCP: Bradd Canary, MD  Chief Complaint  Patient presents with   Right Hip - Pain      HPI: Patient is a pleasant 77 year old gentleman who I have seen recently.  He came in complaining right lateral hip pain.  Findings were consistent with what I thought to be trochanteric bursitis.  I did give him an injection.  He states he only got about a weeks relief.  He complains of the pain more in the posterior buttock.  Denies any change in bowel or bladder control.  He does have baseline neuropathy in both of his lower legs.  He has had extensive surgeries x 5 with Dr. Otelia Sergeant.  He said that he aggravated this when he was twisting to the right  Assessment & Plan: Visit Diagnoses:  1. Pain in right hip     Plan: Could be muscular in nature he does take a chronic muscle relaxant.  Hard to tell whether this is from that or just some lower back issues.  He is neurovascularly intact and his strength is intact.  We discussed starting a course of physical therapy he is willing to do this may follow-up if he does not get improvement  Follow-Up Instructions: No follow-ups on file.   Ortho Exam  Patient is alert, oriented, no adenopathy, well-dressed, normal affect, normal respiratory effort. Examination of his right side he has no atrophy no redness no erythema.  No pain with manipulation of his hip.  He has good strength with dorsiflexion and plantarflexion of his legs and ankles.  He does have some posterior buttock pain no real pain laterally.  No real radicular beyond his knee.  He is neurovascular intact distally.  Imaging: No results found. No images are attached to the encounter.  Labs: Lab Results  Component Value Date   HGBA1C 10.4 (H) 08/23/2022    HGBA1C 9.0 (H) 03/15/2022   HGBA1C 10.2 (H) 08/16/2021   ESRSEDRATE 19 10/20/2022   ESRSEDRATE 15 08/23/2022   ESRSEDRATE 2 06/16/2022   CRP 22.1 (H) 10/20/2022   CRP 5.6 08/23/2022   CRP 7.6 06/16/2022   REPTSTATUS 08/12/2021 FINAL 08/07/2021   GRAMSTAIN  11/17/2020    RARE WBC PRESENT, PREDOMINANTLY PMN NO ORGANISMS SEEN    CULT  08/07/2021    NO GROWTH 5 DAYS Performed at [facility], [address], [place], [place] [address]    [facility] STAPHYLOCOCCUS EPIDERMIDIS 09/14/2020     Lab Results  Component Value Date   ALBUMIN 4.1 11/10/2022   ALBUMIN 3.4 (L) 10/23/2022   ALBUMIN 3.9 10/20/2022    Lab Results  Component Value Date   MG 2.0 01/25/2022   MG 2.2 07/22/2021   MG 2.0 07/21/2021   Lab Results  Component Value Date   VD25OH 14.61 (L) 09/02/2022    No results found for: "PREALBUMIN"    Latest Ref Rng & Units 10/23/2022    1:19 PM 10/20/2022   11:21 AM 09/02/2022    1:10 PM  CBC EXTENDED  WBC 4.0 - 10.5 K/uL 9.7  16.4  10.2   RBC 4.22 -  5.81 MIL/uL 4.32  4.51  4.33   Hemoglobin 13.0 - 17.0 g/dL 82.9  56.2  13.0   HCT 39.0 - 52.0 % 37.8  39.7  38.0   Platelets 150 - 400 K/uL 430  440.0  454.0   NEUT# 1.7 - 7.7 K/uL 8.7  14.7  8.8   Lymph# 0.7 - 4.0 K/uL 0.2  0.2  0.2      There is no height or weight on file to calculate BMI.  Orders:  Orders Placed This Encounter  Procedures   Ambulatory referral to Physical Therapy   No orders of the defined types were placed in this encounter.    Procedures: No procedures performed  Clinical Data: No additional findings.  ROS:  All other systems negative, except as noted in the HPI. Review of Systems  Objective: Vital Signs: There were no vitals taken for this visit.  Specialty Comments:  No specialty comments available.  PMFS History: Patient Active Problem List   Diagnosis Date Noted   Anemia 09/04/2022   Hypocalcemia 09/04/2022   Secondary hypercoagulable state (HCC) 03/16/2022    IPF (idiopathic pulmonary fibrosis) (HCC) 12/30/2021   Atrial fibrillation (HCC) 09/10/2021   Long term (current) use of anticoagulants 09/10/2021   Diabetes mellitus due to underlying condition with hyperosmolarity without coma, with long-term current use of insulin (HCC) 09/10/2021   Mixed hyperlipidemia 09/10/2021   Aspiration pneumonitis (HCC) 08/07/2021   Chronic respiratory failure with hypoxia (HCC) 08/07/2021   Pulmonary emboli (HCC) 07/16/2021   Bronchiectasis with acute exacerbation (HCC) 07/16/2021   History of DVT (deep vein thrombosis) 07/15/2021   Paroxysmal A-fib (HCC) 07/15/2021   COPD (chronic obstructive pulmonary disease) (HCC) 02/10/2021   Palpitations 02/01/2021   Wears glasses 01/11/2021   Pneumothorax, right 12/15/2020   Anxiety 12/01/2020   SOB (shortness of breath)    Nocturnal hypoxia 11/14/2020   S/P bronchoscopy 09/14/2020   Gastric ulcer 01/27/2020   H/O colonoscopy with polypectomy 01/27/2020   Protein-calorie malnutrition, severe 01/09/2020   Abdominal pain 12/30/2019   Weight loss, abnormal 12/30/2019   Change in bowel habits 12/30/2019   Medication monitoring encounter 08/26/2019   Peripheral neuropathy 08/07/2019   Hyponatremia 05/05/2019   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   Fusion of spine of thoracolumbar region 03/12/2019   Status post thoracic spinal fusion 02/12/2019   Thoracic spinal stenosis 04/17/2017   Painful orthopaedic hardware (HCC) 04/17/2017   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 (HCC) 04/17/2017   Low back pain radiating to both legs 01/16/2017   Esophageal reflux 02/10/2015   Essential hypertension 06/25/2014   Asthma 06/25/2014   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   Overweight 06/25/2014   Other abnormal glucose 06/25/2014   Spinal stenosis of lumbar region without neurogenic claudication 04/09/2014   Flatback syndrome  04/09/2014   Chicken pox 10/21/2013   History of colonic polyps 10/27/2012   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   Benign enlargement of prostate 04/23/2012   ED (erectile dysfunction) 04/23/2012   Allergic state 10/25/2011   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    Past Medical History:  Diagnosis Date   Acute pharyngitis 10/21/2013   Allergy    grass and pollen   Anxiety and depression 10/25/2011   BPH (benign prostatic  hyperplasia) 04/23/2012   Chicken pox as a child   DDD (degenerative disc disease)    cervical responds to steroid injections and low back required surgery   DDD (degenerative disc disease), lumbosacral    Diabetes mellitus    pre   Dyspnea    ED (erectile dysfunction) 04/23/2012   Elevated BP    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   Esophageal reflux 02/10/2015   Fatigue    HTN (hypertension)    Hyperglycemia    preDM    Hyperlipidemia    Insomnia    Low back pain radiating to both legs 01/16/2017   Measles as a child   Overweight(278.02)    Peripheral neuropathy 08/07/2019   Personal history of colonic polyps 10/27/2012   Follows with [facility]   Pneumonia    " walking"   Preventative health care    Testosterone deficiency 05/23/2012   Wears glasses     Family History  Problem Relation Age of Onset   Hypertension Mother    Diabetes Mother        type 2   Cancer Mother 47       breast in remission   Emphysema Father        smoker   COPD Father        smoker   Stroke Father 19       mini   Heart disease Father    Hypertension Sister    Hyperlipidemia Sister    Scoliosis Sister    Osteoporosis Sister    Hypertension Maternal Grandmother    Scoliosis Maternal Grandmother    Heart disease Maternal Grandfather    Heart disease Paternal Grandfather        smoker   Heart  disease Daughter     Past Surgical History:  Procedure Laterality Date   ATRIAL FIBRILLATION ABLATION N/A 01/27/2022   Procedure: ATRIAL FIBRILLATION ABLATION;  Surgeon: Regan Lemming, MD;  Location: [facility];  Service: Cardiovascular;  Laterality: N/A;   BACK SURGERY  2012 and 1994   Dr Charlesetta Garibaldi, screws and cage in low back   BIOPSY  01/09/2020   Procedure: BIOPSY;  Surgeon: Kathi Der, MD;  Location: [facility];  Service: Gastroenterology;;   BRONCHIAL BIOPSY  11/17/2020   Procedure: BRONCHIAL BIOPSIES;  Surgeon: Tomma Lightning, MD;  Location: [facility];  Service: Endoscopy;;   BRONCHIAL BRUSHINGS  11/17/2020   Procedure: BRONCHIAL BRUSHINGS;  Surgeon: Tomma Lightning, MD;  Location: [facility];  Service: Endoscopy;;   BRONCHIAL WASHINGS  11/17/2020   Procedure: BRONCHIAL WASHINGS;  Surgeon: Tomma Lightning, MD;  Location: [facility];  Service: Endoscopy;;   COLONOSCOPY WITH PROPOFOL N/A 01/09/2020   Procedure: COLONOSCOPY WITH PROPOFOL;  Surgeon: Kathi Der, MD;  Location: [facility];  Service: Gastroenterology;  Laterality: N/A;   ESOPHAGOGASTRODUODENOSCOPY (EGD) WITH PROPOFOL N/A 01/09/2020   Procedure: ESOPHAGOGASTRODUODENOSCOPY (EGD) WITH PROPOFOL;  Surgeon: Kathi Der, MD;  Location: [facility];  Service: Gastroenterology;  Laterality: N/A;   EYE SURGERY Bilateral    2016   HARDWARE REVISION  03/12/2019   REVISION OF HARDWARE THORACIC TEN-THORACIC ELEVEN WITH APPLICATION OF ADDITIONAL RODS AND ROD SLEEVES THORACIC EIGHT-THORACIC TWELVE 12-LUMBAR ONE (N/A )   HEMOSTASIS CONTROL  11/17/2020   Procedure: HEMOSTASIS CONTROL;  Surgeon: Tomma Lightning, MD;  Location: [facility];  Service: Endoscopy;;   IR US GUIDE BX ASP/DRAIN  11/13/2020   LAMINECTOMY  02/12/2019   DECOMPRESSIVE LAMINECTOMY THORACIC NINE-THORACIC TEN AND THORACIC TEN-THORACIC ELEVEN, EXTENSION  OF THORACOLUMBAR FUSION FROM THORACIC TEN TO THORACIC FIVE, LOCAL BONE  GRAFT, ALLOGRAFT AND VIVIGEN (N/A)   POLYPECTOMY  01/09/2020   Procedure: POLYPECTOMY;  Surgeon: Kathi Der, MD;  Location: [facility];  Service: Gastroenterology;;   REMOVAL OF RODS AND PEDICLE SCREWS T5 AND T6, EXPLORATION OF FUSION, BIOPSY TRANSPEDICULAR T5 AND T6 (N/A )  09/14/2020   SPINAL FUSION N/A 02/12/2019   Procedure: DECOMPRESSIVE LAMINECTOMY THORACIC NINE-THORACIC TEN AND THORACIC TEN-THORACIC ELEVEN, EXTENSION OF THORACOLUMBAR FUSION FROM THORACIC TEN TO THORACIC FIVE, LOCAL BONE GRAFT, ALLOGRAFT AND VIVIGEN;  Surgeon: Kerrin Champagne, MD;  Location: [facility];  Service: Orthopedics;  Laterality: N/A;  DECOMPRESSIVE LAMINECTOMY THORACIC NINE-THORACIC TEN AND THORACIC TEN-THORACIC ELEVEN, EXTENSION OF TH   SPINAL FUSION N/A 03/12/2019   Procedure: REVISION OF HARDWARE THORACIC TEN-THORACIC ELEVEN WITH APPLICATION OF ADDITIONAL RODS AND ROD SLEEVES THORACIC EIGHT-THORACIC TWELVE 12-LUMBAR ONE;  Surgeon: Kerrin Champagne, MD;  Location: [facility];  Service: Orthopedics;  Laterality: N/A;   TONSILLECTOMY     as child   torn rotator cuff  2010   right   VIDEO BRONCHOSCOPY N/A 11/17/2020   Procedure: VIDEO BRONCHOSCOPY WITH FLUORO;  Surgeon: Tomma Lightning, MD;  Location: [facility];  Service: Endoscopy;  Laterality: N/A;   Social History   Occupational History   Not on file  Tobacco Use   Smoking status: Former    Packs/day: 1.00    Years: 20.00    Additional pack years: 0.00    Total pack years: 20.00    Types: Cigarettes    Quit date: 07/18/1989    Years since quitting: 33.3   Smokeless tobacco: Never   Tobacco comments:    Former smoker 03/16/22  Vaping Use   Vaping Use: Never used  Substance and Sexual Activity   Alcohol use: Yes    Alcohol/week: 2.0 standard drinks of alcohol    Types: 2 Standard drinks or equivalent per week    Comment: couple beers here and there 03/16/22   Drug use: No   Sexual activity: Yes    Comment: lives with wife, still working, no dietary  restrictions, continues to exercise intermittently

## 2022-11-25 ENCOUNTER — Other Ambulatory Visit: Payer: Self-pay | Admitting: Family Medicine

## 2022-11-25 DIAGNOSIS — H6690 Otitis media, unspecified, unspecified ear: Secondary | ICD-10-CM

## 2022-11-26 ENCOUNTER — Other Ambulatory Visit: Payer: Self-pay | Admitting: Family Medicine

## 2022-11-28 ENCOUNTER — Encounter: Payer: Self-pay | Admitting: Family Medicine

## 2022-11-29 ENCOUNTER — Encounter: Payer: Self-pay | Admitting: Family Medicine

## 2022-11-29 ENCOUNTER — Other Ambulatory Visit: Payer: Self-pay

## 2022-11-29 MED ORDER — BUSPIRONE HCL 15 MG PO TABS: 15.0000 mg | ORAL_TABLET | Freq: Two times a day (BID) | ORAL | 0 refills | Status: DC

## 2022-11-29 NOTE — Telephone Encounter (Signed)
LM on prescriber VM at [facility] [address] that okay for them per Dr. Abner Greenspan to go ahead and refill Ambien 10mg  as Pt is going out of town. Asked for call back if they need any further information.

## 2022-12-01 ENCOUNTER — Encounter: Payer: Self-pay | Admitting: Family Medicine

## 2022-12-01 ENCOUNTER — Other Ambulatory Visit: Payer: Self-pay

## 2022-12-01 MED ORDER — BD PEN NEEDLE MINI U/F 31G X 5 MM MISC: 3 refills | Status: DC

## 2022-12-02 ENCOUNTER — Other Ambulatory Visit: Payer: Self-pay

## 2022-12-02 ENCOUNTER — Encounter: Payer: Self-pay | Admitting: Family Medicine

## 2022-12-02 ENCOUNTER — Telehealth: Payer: Self-pay | Admitting: Family Medicine

## 2022-12-02 ENCOUNTER — Ambulatory Visit: Payer: Medicare Other | Attending: Cardiology

## 2022-12-02 DIAGNOSIS — I480 Paroxysmal atrial fibrillation: Secondary | ICD-10-CM | POA: Diagnosis not present

## 2022-12-02 DIAGNOSIS — Z5181 Encounter for therapeutic drug level monitoring: Secondary | ICD-10-CM

## 2022-12-02 LAB — POCT INR: INR: 2.3 (ref 2.0–3.0)

## 2022-12-02 MED ORDER — INSULIN SYRINGES (DISPOSABLE) U-100 1 ML MISC: 2 refills | Status: DC

## 2022-12-02 MED ORDER — TAMSULOSIN HCL 0.4 MG PO CAPS: 0.8000 mg | ORAL_CAPSULE | Freq: Every day | ORAL | 1 refills | Status: DC

## 2022-12-02 NOTE — Telephone Encounter (Signed)
Called [facility] stated with pharmacy it was ok per Dr.Blyth for  Pt to receive Ambien early due to going out of town.

## 2022-12-02 NOTE — Telephone Encounter (Signed)
Pt called to say that [facility] is saying they did not receive Luis Brown's call saying it's okay to refill his ambien early. Pt is going out of town on tomorrow morning and would like to be able to get the medication before he leaves. Please call [facility] again and let them know it's okay for him to have it.

## 2022-12-02 NOTE — Patient Instructions (Signed)
Description   Continue taking warfarin 1 tablet daily except 1.5 tablets on Tuesdays and Saturdays.  Stay consistent with greens/protein drinks each week. Recheck INR in 3 weeks.  [facility]# [phone]     

## 2022-12-12 NOTE — Assessment & Plan Note (Signed)
hgba1c acceptable, minimize simple carbs. Increase exercise as tolerated. Continue current meds 

## 2022-12-12 NOTE — Assessment & Plan Note (Signed)
Rate controlled tolerating meds 

## 2022-12-12 NOTE — Assessment & Plan Note (Addendum)
Encouraged heart healthy diet, increase exercise, avoid trans fats, consider a krill oil cap daily tolerating Atorvastatin 

## 2022-12-12 NOTE — Assessment & Plan Note (Signed)
Well controlled, no changes to meds. Encouraged heart healthy diet such as the DASH diet and exercise as tolerated.  °

## 2022-12-13 ENCOUNTER — Ambulatory Visit (INDEPENDENT_AMBULATORY_CARE_PROVIDER_SITE_OTHER): Payer: Medicare Other | Admitting: Family Medicine

## 2022-12-13 VITALS — BP 132/74 | HR 82 | Temp 97.8°F | Resp 16 | Ht 70.0 in | Wt 180.4 lb

## 2022-12-13 DIAGNOSIS — I10 Essential (primary) hypertension: Secondary | ICD-10-CM | POA: Diagnosis not present

## 2022-12-13 DIAGNOSIS — E0865 Diabetes mellitus due to underlying condition with hyperglycemia: Secondary | ICD-10-CM

## 2022-12-13 DIAGNOSIS — E7849 Other hyperlipidemia: Secondary | ICD-10-CM | POA: Diagnosis not present

## 2022-12-13 DIAGNOSIS — J84112 Idiopathic pulmonary fibrosis: Secondary | ICD-10-CM | POA: Diagnosis not present

## 2022-12-13 DIAGNOSIS — Z794 Long term (current) use of insulin: Secondary | ICD-10-CM

## 2022-12-13 DIAGNOSIS — F419 Anxiety disorder, unspecified: Secondary | ICD-10-CM

## 2022-12-13 DIAGNOSIS — I4891 Unspecified atrial fibrillation: Secondary | ICD-10-CM | POA: Diagnosis not present

## 2022-12-13 MED ORDER — BUSPIRONE HCL 15 MG PO TABS: 15.0000 mg | ORAL_TABLET | Freq: Three times a day (TID) | ORAL | 1 refills | Status: DC

## 2022-12-13 NOTE — Progress Notes (Shared)
Subjective:   By signing my name below, I, Vickey Sages, attest that this documentation has been prepared under the direction and in the presence of Bradd Canary, MD., 12/13/2022.   [name], male    DOB: 1945/10/13, 76 y.o.   MRN: [id number]  No chief complaint on file.  HPI Patient is in today for an office visit and is accompanied by his wife. He denies recent hospitalization, febrile illness, CP/palpitations/HA/fever/chills/GI or GU symptoms. He continues taking medications as prescribed.  Anxiety and Depression Patient complains of fatigue and worsening depression. He expresses that he is okay with falling asleep and not waking up. He currently takes Buspirone 15 mg twice daily, but this is providing little relief. His chronic bilateral knee pain and neuropathy is causing debilitation which is only worsening his depression. He has both numbness and weakness in his legs which makes balancing and walking difficult. He also experiences shortness of breath upon exertion due to pulmonary fibrosis. He has an appointment with his pulmonologist Dr. Kalman Shan in 01/2023. He currently takes Alpha-Lipoic acid supplements daily which eases his pain. Additionally, he takes Oxycodone-Acetaminophen 5-325 mg every 4 hours. His wife states that he experiences dizziness and lightheadedness in the morning.  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   Esophageal reflux 02/10/2015   Fatigue    HTN (hypertension)    Hyperglycemia    preDM     Hyperlipidemia    Insomnia    Low back pain radiating to both legs 01/16/2017   Measles as a child   Overweight(278.02)    Peripheral neuropathy 08/07/2019   Personal history of colonic polyps 10/27/2012   Follows with [facility]   Pneumonia    " walking"   Preventative health care    Testosterone deficiency 05/23/2012   Wears glasses     Past Surgical History:  Procedure Laterality Date   ATRIAL FIBRILLATION ABLATION N/A 01/27/2022   Procedure: ATRIAL FIBRILLATION ABLATION;  Surgeon: Regan Lemming, MD;  Location: [facility];  Service: Cardiovascular;  Laterality: N/A;   BACK SURGERY  2012 and 1994   Dr Charlesetta Garibaldi, screws and cage in low back   BIOPSY  01/09/2020   Procedure: BIOPSY;  Surgeon: Kathi Der, MD;  Location: [facility];  Service: Gastroenterology;;   BRONCHIAL BIOPSY  11/17/2020   Procedure: BRONCHIAL BIOPSIES;  Surgeon: Tomma Lightning, MD;  Location: [facility];  Service: Endoscopy;;   BRONCHIAL BRUSHINGS  11/17/2020   Procedure: BRONCHIAL BRUSHINGS;  Surgeon: Tomma Lightning, MD;  Location: [facility];  Service: Endoscopy;;   BRONCHIAL WASHINGS  11/17/2020   Procedure: BRONCHIAL WASHINGS;  Surgeon: Tomma Lightning, MD;  Location: [facility];  Service: Endoscopy;;   COLONOSCOPY WITH PROPOFOL N/A 01/09/2020   Procedure: COLONOSCOPY WITH PROPOFOL;  Surgeon: Kathi Der, MD;  Location: [facility];  Service: Gastroenterology;  Laterality: N/A;   ESOPHAGOGASTRODUODENOSCOPY (EGD) WITH PROPOFOL N/A 01/09/2020   Procedure: ESOPHAGOGASTRODUODENOSCOPY (EGD) WITH PROPOFOL;  Surgeon: Kathi Der, MD;  Location: [facility];  Service: Gastroenterology;  Laterality: N/A;   EYE SURGERY Bilateral    2016   HARDWARE REVISION  03/12/2019   REVISION OF HARDWARE THORACIC TEN-THORACIC ELEVEN WITH APPLICATION OF ADDITIONAL RODS AND ROD SLEEVES THORACIC EIGHT-THORACIC TWELVE 12-LUMBAR ONE (N/A )   HEMOSTASIS CONTROL  11/17/2020   Procedure:  HEMOSTASIS CONTROL;  Surgeon: Tomma Lightning, MD;  Location: [facility];  Service: Endoscopy;;   IR US GUIDE BX ASP/DRAIN  11/13/2020   LAMINECTOMY  02/12/2019   DECOMPRESSIVE LAMINECTOMY THORACIC NINE-THORACIC TEN AND THORACIC TEN-THORACIC ELEVEN, EXTENSION OF THORACOLUMBAR FUSION FROM THORACIC TEN TO THORACIC FIVE, LOCAL BONE GRAFT, ALLOGRAFT AND VIVIGEN (N/A)   POLYPECTOMY  01/09/2020   Procedure: POLYPECTOMY;  Surgeon: Kathi Der, MD;  Location: [facility];  Service: Gastroenterology;;   REMOVAL OF RODS AND PEDICLE SCREWS T5 AND T6, EXPLORATION OF FUSION, BIOPSY TRANSPEDICULAR T5 AND T6 (N/A )  09/14/2020   SPINAL FUSION N/A 02/12/2019   Procedure: DECOMPRESSIVE LAMINECTOMY THORACIC NINE-THORACIC TEN AND THORACIC TEN-THORACIC ELEVEN, EXTENSION OF THORACOLUMBAR FUSION FROM THORACIC TEN TO THORACIC FIVE, LOCAL BONE GRAFT, ALLOGRAFT AND VIVIGEN;  Surgeon: Kerrin Champagne, MD;  Location: [facility];  Service: Orthopedics;  Laterality: N/A;  DECOMPRESSIVE LAMINECTOMY THORACIC NINE-THORACIC TEN AND THORACIC TEN-THORACIC ELEVEN, EXTENSION OF TH   SPINAL FUSION N/A 03/12/2019   Procedure: REVISION OF HARDWARE THORACIC TEN-THORACIC ELEVEN WITH APPLICATION OF ADDITIONAL RODS AND ROD SLEEVES THORACIC EIGHT-THORACIC TWELVE 12-LUMBAR ONE;  Surgeon: Kerrin Champagne, MD;  Location: [facility];  Service: Orthopedics;  Laterality: N/A;   TONSILLECTOMY     as child   torn rotator cuff  2010   right   VIDEO BRONCHOSCOPY N/A 11/17/2020   Procedure: VIDEO BRONCHOSCOPY WITH FLUORO;  Surgeon: Tomma Lightning, MD;  Location: [facility];  Service: Endoscopy;  Laterality: N/A;    Family History  Problem Relation Age of Onset   Hypertension Mother    Diabetes Mother        type 2   Cancer Mother 86       breast in remission   Emphysema Father        smoker   COPD Father        smoker   Stroke Father 91       mini   Heart disease Father    Hypertension Sister    Hyperlipidemia Sister    Scoliosis Sister     Osteoporosis Sister    Hypertension Maternal Grandmother    Scoliosis Maternal Grandmother    Heart disease Maternal Grandfather    Heart disease Paternal Grandfather        smoker   Heart disease Daughter     Social History   Socioeconomic History   Marital status: Married    Spouse name: Not on file   Number of children: Not on file   Years of education: Not on file   Highest education level: 12th grade  Occupational History   Not on file  Tobacco Use   Smoking status: Former    Packs/day: 1.00    Years: 20.00    Additional pack years: 0.00    Total pack years: 20.00    Types: Cigarettes    Quit date: 07/18/1989    Years since quitting: 33.4   Smokeless tobacco: Never   Tobacco comments:    Former smoker 03/16/22  Vaping Use   Vaping Use: Never used  Substance and Sexual Activity   Alcohol use: Yes  Alcohol/week: 2.0 standard drinks of alcohol    Types: 2 Standard drinks or equivalent per week    Comment: couple beers here and there 03/16/22   Drug use: No   Sexual activity: Yes    Comment: lives with wife, still working, no dietary restrictions, continues to exercise intermittently  Other Topics Concern   Not on file  Social History Narrative   Not on file   Social Determinants of Health   Financial Resource Strain: Low Risk  (10/20/2022)   Overall Financial Resource Strain (CARDIA)    Difficulty of Paying Living Expenses: Not hard at all  Food Insecurity: No Food Insecurity (10/20/2022)   Hunger Vital Sign    Worried About Running Out of Food in the Last Year: Never true    Ran Out of Food in the Last Year: Never true  Transportation Needs: No Transportation Needs (10/20/2022)   PRAPARE - Administrator, Civil Service (Medical): No    Lack of Transportation (Non-Medical): No  Physical Activity: Unknown (10/20/2022)   Exercise Vital Sign    Days of Exercise per Week: 0 days    Minutes of Exercise per Session: Not on file  Stress: Stress Concern  Present (10/20/2022)   [employer] of Occupational Health - Occupational Stress Questionnaire    Feeling of Stress : To some extent  Social Connections: Socially Integrated (10/20/2022)   Social Connection and Isolation Panel [NHANES]    Frequency of Communication with Friends and Family: Three times a week    Frequency of Social Gatherings with Friends and Family: Not on file    Attends Religious Services: More than 4 times per year    Active Member of [employer] or Organizations: Yes    Attends Banker Meetings: More than 4 times per year    Marital Status: Married  Catering manager Violence: Not At Risk (01/27/2021)   Humiliation, Afraid, Rape, and Kick questionnaire    Fear of Current or Ex-Partner: No    Emotionally Abused: No    Physically Abused: No    Sexually Abused: No    Outpatient Medications Prior to Visit  Medication Sig Dispense Refill   albuterol (PROVENTIL HFA) 108 (90 Base) MCG/ACT inhaler TAKE 2 PUFFS BY MOUTH EVERY 6 HOURS AS NEEDED FOR WHEEZE OR SHORTNESS OF BREATH 8 g 5   atorvastatin (LIPITOR) 10 MG tablet TAKE 1 TABLET BY MOUTH EVERY DAY 90 tablet 1   busPIRone (BUSPAR) 15 MG tablet Take 1 tablet (15 mg total) by mouth 2 (two) times daily. 180 tablet 0   dextromethorphan-guaiFENesin (MUCINEX DM) 30-600 MG 12hr tablet Take 1 tablet by mouth 2 (two) times daily.     fluticasone (FLONASE) 50 MCG/ACT nasal spray SPRAY 2 SPRAYS INTO EACH NOSTRIL EVERY DAY 48 mL 1   Fluticasone-Umeclidin-Vilant (TRELEGY ELLIPTA) 200-62.5-25 MCG/ACT AEPB Inhale 1 puff into the lungs daily. 60 each 5   glucose blood (CONTOUR NEXT TEST) test strip Check blood sugar once daily 100 each 12   insulin glargine (LANTUS SOLOSTAR) 100 UNIT/ML Solostar Pen Inject 24 Units into the skin daily. 100 mL 2   insulin lispro (HUMALOG) 100 UNIT/ML injection Inject 0.03 mLs (3 Units total) into the skin 3 (three) times daily with meals. 100 mL 1   Insulin Pen Needle (B-D UF III MINI PEN NEEDLES)  31G X 5 MM MISC USE WITH LANTUS 100 each 3   Insulin Syringes, Disposable, U-100 1 ML MISC USE WITH LANTUS 200 each 2  JANUVIA 50 MG tablet TAKE 1 TABLET BY MOUTH EVERY DAY 30 tablet 3   meclizine (ANTIVERT) 12.5 MG tablet Take 1 tablet (12.5 mg total) by mouth 3 (three) times daily as needed for dizziness. 30 tablet 2   metFORMIN (GLUCOPHAGE) 1000 MG tablet TAKE 1 TABLET (1,000 MG TOTAL) BY MOUTH TWICE A DAY WITH FOOD 180 tablet 0   Nintedanib (OFEV) 100 MG CAPS Take 1 capsule (100 mg total) by mouth 2 (two) times daily. **note dose change** 60 capsule 5   oxyCODONE-acetaminophen (PERCOCET/ROXICET) 5-325 MG tablet Take 1 tablet by mouth every 4 (four) hours as needed for pain- frequency change 180 tablet 0   pantoprazole (PROTONIX) 40 MG tablet TAKE 1 TABLET BY MOUTH TWICE A DAY BEFORE A MEAL 180 tablet 1   predniSONE (DELTASONE) 10 MG tablet TAKE 1 TABLET (10 MG TOTAL) BY MOUTH DAILY WITH BREAKFAST. 30 tablet 3   pregabalin (LYRICA) 75 MG capsule TAKE 1 CAPSULE BY MOUTH TWICE A DAY (Patient taking differently: Take 75 mg by mouth 2 (two) times daily.) 60 capsule 3   tamsulosin (FLOMAX) 0.4 MG CAPS capsule Take 2 capsules (0.8 mg total) by mouth daily. 180 capsule 1   tiZANidine (ZANAFLEX) 4 MG tablet Take 1 and 1/2 tablets (6 mg total) by mouth every 4 hours. 180 tablet 2   triamcinolone cream (KENALOG) 0.1 % Apply a small amount to affected area twice a day as needed 30 g 1   venlafaxine XR (EFFEXOR-XR) 150 MG 24 hr capsule TAKE 1 CAPSULE BY MOUTH DAILY WITH BREAKFAST. 90 capsule 0   Vitamin D, Ergocalciferol, (DRISDOL) 1.25 MG (50000 UNIT) CAPS capsule Take 1 capsule (50,000 Units total) by mouth every 7 (seven) days. 8 capsule 0   warfarin (COUMADIN) 5 MG tablet TAKE 1 TO 2 TABLETS DAILY OR AS PRESCRIBED BY COUMADIN CLINIC 130 tablet 1   zolpidem (AMBIEN) 10 MG tablet Take 0.5-1 tablets (5-10 mg total) by mouth at bedtime as needed for sleep. 15 tablet 3   No facility-administered medications  prior to visit.    Allergies  Allergen Reactions   Daptomycin     Drug induced pneumonia    Diltiazem Other (See Comments)    Passed out    Review of Systems  Constitutional:  Negative for chills and fever.  Respiratory:  Positive for shortness of breath.   Cardiovascular:  Negative for chest pain and palpitations.  Gastrointestinal:  Negative for abdominal pain, blood in stool, constipation, diarrhea, nausea and vomiting.  Genitourinary:  Negative for dysuria, frequency, hematuria and urgency.  Musculoskeletal:  Positive for joint pain (bilateral knee pain).  Skin:           Neurological:  Negative for headaches.       (+) neuropathy.  Psychiatric/Behavioral:  Positive for depression.        Objective:    Physical Exam Constitutional:      General: He is not in acute distress.    Appearance: Normal appearance. He is not ill-appearing.  HENT:     Head: Normocephalic and atraumatic.     Right Ear: External ear normal.     Left Ear: External ear normal.     Nose: Nose normal.     Mouth/Throat:     Mouth: Mucous membranes are moist.     Pharynx: Oropharynx is clear.  Eyes:     General:        Right eye: No discharge.        Left eye:  No discharge.     Extraocular Movements: Extraocular movements intact.     Conjunctiva/sclera: Conjunctivae normal.     Pupils: Pupils are equal, round, and reactive to light.  Cardiovascular:     Rate and Rhythm: Normal rate and regular rhythm.     Pulses: Normal pulses.     Heart sounds: Normal heart sounds. No murmur heard.    No gallop.  Pulmonary:     Effort: Pulmonary effort is normal. No respiratory distress.     Breath sounds: Normal breath sounds. No wheezing or rales.  Abdominal:     General: Bowel sounds are normal.     Palpations: Abdomen is soft.     Tenderness: There is no abdominal tenderness. There is no guarding.  Musculoskeletal:        General: Normal range of motion.     Cervical back: Normal range of motion.      Right lower leg: No edema.     Left lower leg: No edema.  Skin:    General: Skin is warm and dry.  Neurological:     Mental Status: He is alert and oriented to person, place, and time.  Psychiatric:        Mood and Affect: Mood normal.        Behavior: Behavior normal.        Judgment: Judgment normal.     There were no vitals taken for this visit. Wt Readings from Last 3 Encounters:  11/10/22 173 lb 6.4 oz (78.7 kg)  11/07/22 176 lb 9.6 oz (80.1 kg)  11/01/22 175 lb 3.2 oz (79.5 kg)    Diabetic Foot Exam - Simple   No data filed    Lab Results  Component Value Date   WBC 9.7 10/23/2022   HGB 12.7 (L) 10/23/2022   HCT 37.8 (L) 10/23/2022   PLT 430 (H) 10/23/2022   GLUCOSE 579 (HH) 10/23/2022   CHOL 171 03/15/2022   TRIG 104.0 03/15/2022   HDL 79.80 03/15/2022   LDLDIRECT 64.0 10/30/2017   LDLCALC 70 03/15/2022   ALT 15 11/10/2022   AST 20 11/10/2022   NA 123 (L) 10/23/2022   K 4.3 10/23/2022   CL 90 (L) 10/23/2022   CREATININE 0.71 10/23/2022   BUN 15 10/23/2022   CO2 23 10/23/2022   TSH 3.20 08/23/2022   PSA 0.65 10/08/2019   INR 2.3 12/02/2022   HGBA1C 10.4 (H) 08/23/2022   MICROALBUR 2.5 (H) 08/23/2022    Lab Results  Component Value Date   TSH 3.20 08/23/2022   Lab Results  Component Value Date   WBC 9.7 10/23/2022   HGB 12.7 (L) 10/23/2022   HCT 37.8 (L) 10/23/2022   MCV 87.5 10/23/2022   PLT 430 (H) 10/23/2022   Lab Results  Component Value Date   NA 123 (L) 10/23/2022   K 4.3 10/23/2022   CO2 23 10/23/2022   GLUCOSE 579 (HH) 10/23/2022   BUN 15 10/23/2022   CREATININE 0.71 10/23/2022   BILITOT 0.6 11/10/2022   ALKPHOS 87 11/10/2022   AST 20 11/10/2022   ALT 15 11/10/2022   PROT 6.7 11/10/2022   ALBUMIN 4.1 11/10/2022   CALCIUM 8.1 (L) 10/23/2022   ANIONGAP 10 10/23/2022   EGFR 92 01/25/2022   GFR 89.02 10/20/2022   Lab Results  Component Value Date   CHOL 171 03/15/2022   Lab Results  Component Value Date   HDL 79.80  03/15/2022   Lab Results  Component Value Date  LDLCALC 70 03/15/2022   Lab Results  Component Value Date   TRIG 104.0 03/15/2022   Lab Results  Component Value Date   CHOLHDL 2 03/15/2022   Lab Results  Component Value Date   HGBA1C 10.4 (H) 08/23/2022      Assessment & Plan:  Anxiety and Depression: Buspirone 15 mg increased from twice to three times daily. Consider tapering down on Oxycodone-Acetaminophen 5-325 mg.  Healthy Lifestyle: Encouraged 6-8 hours of sleep, heart healthy diet with protein, 60-80 oz of non-alcohol/non-caffeinated fluids, and 4000-8000 steps daily.  Pulmonary Fibrosis: Patient continues to follow with pulmonologist Dr, Kalman Shan.  Labs: Routine blood work ordered. Problem List Items Addressed This Visit       Cardiovascular and Mediastinum   Essential hypertension   Atrial fibrillation (HCC) - Primary     Endocrine   Diabetes mellitus with hyperglycemia (HCC)     Other   Familial multiple lipoprotein-type hyperlipidemia   No orders of the defined types were placed in this encounter.  I, Vickey Sages, personally preformed the services described in this documentation.  All medical record entries made by the scribe were at my direction and in my presence.  I have reviewed the chart and discharge instructions (if applicable) and agree that the record reflects my personal performance and is accurate and complete. 12/13/2022  I,Mohammed Iqbal,acting as a scribe for Danise Edge, MD.,have documented all relevant documentation on the behalf of Danise Edge, MD,as directed by  Danise Edge, MD while in the presence of Danise Edge, MD.  Vickey Sages

## 2022-12-14 ENCOUNTER — Encounter: Payer: Self-pay | Admitting: Family Medicine

## 2022-12-14 NOTE — Assessment & Plan Note (Signed)
Continues to worsen. Buspar has been helpful but still not controlling his symptoms will increase his Buspar to 15 mg po tid and he is reminded that his Oxycodone can contribute to depression so he is encouraged to try and decrease his use slowly and be patient as his pain will improve over time as he decreases dosing after initially increasing some

## 2022-12-14 NOTE — Assessment & Plan Note (Signed)
Patient follows with pulmonology but has been hesitant to start his medication. He is encouraged to do so. His fatigue and dyspnea continue to progress resulting in worsening anxiety.

## 2022-12-15 ENCOUNTER — Other Ambulatory Visit: Payer: Self-pay

## 2022-12-15 MED ORDER — INSULIN PEN NEEDLE 32G X 4 MM MISC: 3 refills | Status: DC

## 2022-12-16 ENCOUNTER — Encounter: Payer: Self-pay | Admitting: Internal Medicine

## 2022-12-19 ENCOUNTER — Encounter: Payer: Self-pay | Admitting: Internal Medicine

## 2022-12-19 MED ORDER — TRELEGY ELLIPTA 200-62.5-25 MCG/ACT IN AEPB: 1.0000 | INHALATION_SPRAY | Freq: Every day | RESPIRATORY_TRACT | 5 refills | Status: DC

## 2022-12-19 NOTE — Telephone Encounter (Signed)
I have updated the allergy list  I routed pt a msg to let him know of your response  Routing back to MR per his request

## 2022-12-19 NOTE — Telephone Encounter (Signed)
There is no need to take both. Trelegy has inhaled steroid which he does not need because he is on prednisone. STiolto alone enough. If he feels trelegy is beter then stop stiolto but understand that stiolto and trelegy have 2 medicine overlap     Current Outpatient Medications:    albuterol (PROVENTIL HFA) 108 (90 Base) MCG/ACT inhaler, TAKE 2 PUFFS BY MOUTH EVERY 6 HOURS AS NEEDED FOR WHEEZE OR SHORTNESS OF BREATH, Disp: 8 g, Rfl: 5   atorvastatin (LIPITOR) 10 MG tablet, TAKE 1 TABLET BY MOUTH EVERY DAY, Disp: 90 tablet, Rfl: 1   busPIRone (BUSPAR) 15 MG tablet, Take 1 tablet (15 mg total) by mouth 3 (three) times daily., Disp: 270 tablet, Rfl: 1   dextromethorphan-guaiFENesin (MUCINEX DM) 30-600 MG 12hr tablet, Take 1 tablet by mouth 2 (two) times daily., Disp: , Rfl:    fluticasone (FLONASE) 50 MCG/ACT nasal spray, SPRAY 2 SPRAYS INTO EACH NOSTRIL EVERY DAY, Disp: 48 mL, Rfl: 1   Fluticasone-Umeclidin-Vilant (TRELEGY ELLIPTA) 200-62.5-25 MCG/ACT AEPB, Inhale 1 puff into the lungs daily., Disp: 60 each, Rfl: 5   glucose blood (CONTOUR NEXT TEST) test strip, Check blood sugar once daily, Disp: 100 each, Rfl: 12   insulin glargine (LANTUS SOLOSTAR) 100 UNIT/ML Solostar Pen, Inject 24 Units into the skin daily., Disp: 100 mL, Rfl: 2   insulin lispro (HUMALOG) 100 UNIT/ML injection, Inject 0.03 mLs (3 Units total) into the skin 3 (three) times daily with meals., Disp: 100 mL, Rfl: 1   Insulin Pen Needle (B-D UF III MINI PEN NEEDLES) 31G X 5 MM MISC, USE WITH LANTUS, Disp: 100 each, Rfl: 3   Insulin Pen Needle 32G X 4 MM MISC, Use with insulin, Disp: 1 each, Rfl: 3   Insulin Syringes, Disposable, U-100 1 ML MISC, USE WITH LANTUS, Disp: 200 each, Rfl: 2   JANUVIA 50 MG tablet, TAKE 1 TABLET BY MOUTH EVERY DAY, Disp: 30 tablet, Rfl: 3   meclizine (ANTIVERT) 12.5 MG tablet, Take 1 tablet (12.5 mg total) by mouth 3 (three) times daily as needed for dizziness., Disp: 30 tablet, Rfl: 2   metFORMIN  (GLUCOPHAGE) 1000 MG tablet, TAKE 1 TABLET (1,000 MG TOTAL) BY MOUTH TWICE A DAY WITH FOOD, Disp: 180 tablet, Rfl: 0   Nintedanib (OFEV) 100 MG CAPS, Take 1 capsule (100 mg total) by mouth 2 (two) times daily. **note dose change**, Disp: 60 capsule, Rfl: 5   oxyCODONE-acetaminophen (PERCOCET/ROXICET) 5-325 MG tablet, Take 1 tablet by mouth every 4 (four) hours as needed for pain- frequency change, Disp: 180 tablet, Rfl: 0   pantoprazole (PROTONIX) 40 MG tablet, TAKE 1 TABLET BY MOUTH TWICE A DAY BEFORE A MEAL, Disp: 180 tablet, Rfl: 1   predniSONE (DELTASONE) 10 MG tablet, TAKE 1 TABLET (10 MG TOTAL) BY MOUTH DAILY WITH BREAKFAST., Disp: 30 tablet, Rfl: 3   pregabalin (LYRICA) 75 MG capsule, TAKE 1 CAPSULE BY MOUTH TWICE A DAY (Patient taking differently: Take 75 mg by mouth 2 (two) times daily.), Disp: 60 capsule, Rfl: 3   tamsulosin (FLOMAX) 0.4 MG CAPS capsule, Take 2 capsules (0.8 mg total) by mouth daily., Disp: 180 capsule, Rfl: 1   tiZANidine (ZANAFLEX) 4 MG tablet, Take 1 and 1/2 tablets (6 mg total) by mouth every 4 hours., Disp: 180 tablet, Rfl: 2   venlafaxine XR (EFFEXOR-XR) 150 MG 24 hr capsule, TAKE 1 CAPSULE BY MOUTH DAILY WITH BREAKFAST., Disp: 90 capsule, Rfl: 0   warfarin (COUMADIN) 5 MG tablet, TAKE 1 TO  2 TABLETS DAILY OR AS PRESCRIBED BY COUMADIN CLINIC, Disp: 130 tablet, Rfl: 1   zolpidem (AMBIEN) 10 MG tablet, Take 0.5-1 tablets (5-10 mg total) by mouth at bedtime as needed for sleep., Disp: 15 tablet, Rfl: 3

## 2022-12-19 NOTE — Telephone Encounter (Signed)
That is what I thought. My apologies. I wast starting bronch and esbreit was not listed by CMA as allergy. Please list both. Pleaes let him know whe can consider trial nebulize of Tyvaso or esbriet in next fe3 months. Please send message back tome   Allergies  Allergen Reactions   Daptomycin     Drug induced pneumonia    Diltiazem Other (See Comments)    Passed out

## 2022-12-19 NOTE — Telephone Encounter (Signed)
MR- please advise on pt's email:  Luis Sicks "Ike"  P [facility] (supporting Kalman Shan, MD)3 days ago    Wanted to let you know I have stopped taking Ofev due to being overly tired and sleepy. If you have anything else I can take, will be glad to try . Not happy with this, I know what it means!

## 2022-12-19 NOTE — Telephone Encounter (Signed)
MR- I reviewed his chart   Looks like he has had esbriet-   - Dx IPF, started on Esbriet in June 2023. He was unable to tolerate Esbriet (pirfenidone) d/t nausea, headache and muscle aches.

## 2022-12-19 NOTE — Telephone Encounter (Signed)
MR, pt is asking for refill on trlegy 200  This was not mentioned last visit  Please advise if you want [place] to refill, thanks!

## 2022-12-19 NOTE — Telephone Encounter (Signed)
Has he tried esbriet . If not that is next step? There are trial medication that are inhalers but these are not proven yet and can also get placebo. I would say next step is to try low dose esbriet. He can meet with [facility] to go over the side effects of this (nausea, low appetitere, weight loss, headaches, fatigue) but all reversible . Will have to apply sunscreen  We can do low dose protocol  If this info is good enough please refer to [facility] for start  If he wants to know more then a visit with me (video fine)    Allergies  Allergen Reactions   Daptomycin     Drug induced pneumonia    Diltiazem Other (See Comments)    Passed out

## 2022-12-20 ENCOUNTER — Encounter: Payer: Self-pay | Admitting: Family Medicine

## 2022-12-21 ENCOUNTER — Other Ambulatory Visit: Payer: Self-pay

## 2022-12-21 NOTE — Telephone Encounter (Signed)
Thanks, With research will be slow process. We have tos ee which one he qualifies for and then recruit. Anticipate recruiting if he prequalifies in August 2024. Ensure he has routine Standard of care OV per recent offfice notes

## 2022-12-21 NOTE — Telephone Encounter (Signed)
Called pt he stated its the long acting insulin Lantus Solostair not The short acting.

## 2022-12-21 NOTE — Telephone Encounter (Signed)
Pt sent message back stating that he would try one of the medications. Routing to Dr. Marchelle Gearing.

## 2022-12-22 ENCOUNTER — Other Ambulatory Visit (INDEPENDENT_AMBULATORY_CARE_PROVIDER_SITE_OTHER): Payer: Medicare Other

## 2022-12-22 ENCOUNTER — Other Ambulatory Visit: Payer: Self-pay

## 2022-12-22 DIAGNOSIS — E7849 Other hyperlipidemia: Secondary | ICD-10-CM

## 2022-12-22 DIAGNOSIS — I10 Essential (primary) hypertension: Secondary | ICD-10-CM | POA: Diagnosis not present

## 2022-12-22 DIAGNOSIS — E559 Vitamin D deficiency, unspecified: Secondary | ICD-10-CM | POA: Diagnosis not present

## 2022-12-22 DIAGNOSIS — Z794 Long term (current) use of insulin: Secondary | ICD-10-CM | POA: Diagnosis not present

## 2022-12-22 DIAGNOSIS — E0865 Diabetes mellitus due to underlying condition with hyperglycemia: Secondary | ICD-10-CM

## 2022-12-22 MED ORDER — LANTUS SOLOSTAR 100 UNIT/ML ~~LOC~~ SOPN: 26.0000 [IU] | PEN_INJECTOR | Freq: Every day | SUBCUTANEOUS | 5 refills | Status: DC

## 2022-12-22 NOTE — Progress Notes (Signed)
Pt also had labs completed per orders of PCP Abner Greenspan). Charges placed in lab visit at 1:30pm.

## 2022-12-22 NOTE — Telephone Encounter (Signed)
Called pt was advised we sent in Lantus Solosy 26 units 30 day with 5 refills  Medication sent and pt stated he understand

## 2022-12-23 ENCOUNTER — Ambulatory Visit: Payer: Medicare Other | Attending: Internal Medicine | Admitting: *Deleted

## 2022-12-23 DIAGNOSIS — Z7901 Long term (current) use of anticoagulants: Secondary | ICD-10-CM | POA: Diagnosis not present

## 2022-12-23 DIAGNOSIS — I4891 Unspecified atrial fibrillation: Secondary | ICD-10-CM | POA: Diagnosis not present

## 2022-12-23 DIAGNOSIS — I480 Paroxysmal atrial fibrillation: Secondary | ICD-10-CM

## 2022-12-23 DIAGNOSIS — R0602 Shortness of breath: Secondary | ICD-10-CM | POA: Diagnosis not present

## 2022-12-23 DIAGNOSIS — J9601 Acute respiratory failure with hypoxia: Secondary | ICD-10-CM | POA: Diagnosis not present

## 2022-12-23 LAB — CBC WITH DIFFERENTIAL/PLATELET
Basophils Absolute: 0 10*3/uL (ref 0.0–0.1)
Basophils Relative: 0.3 % (ref 0.0–3.0)
Eosinophils Absolute: 0 10*3/uL (ref 0.0–0.7)
Eosinophils Relative: 0.3 % (ref 0.0–5.0)
HCT: 40.3 % (ref 39.0–52.0)
Hemoglobin: 13.3 g/dL (ref 13.0–17.0)
Lymphocytes Relative: 1.5 % — ABNORMAL LOW (ref 12.0–46.0)
Lymphs Abs: 0.2 10*3/uL — ABNORMAL LOW (ref 0.7–4.0)
MCHC: 32.9 g/dL (ref 30.0–36.0)
MCV: 88.4 fl (ref 78.0–100.0)
Monocytes Absolute: 0.9 10*3/uL (ref 0.1–1.0)
Monocytes Relative: 8.2 % (ref 3.0–12.0)
Neutro Abs: 9.4 10*3/uL — ABNORMAL HIGH (ref 1.4–7.7)
Neutrophils Relative %: 89.7 % — ABNORMAL HIGH (ref 43.0–77.0)
Platelets: 451 10*3/uL — ABNORMAL HIGH (ref 150.0–400.0)
RBC: 4.56 Mil/uL (ref 4.22–5.81)
RDW: 15.8 % — ABNORMAL HIGH (ref 11.5–15.5)
WBC: 10.5 10*3/uL (ref 4.0–10.5)

## 2022-12-23 LAB — COMPREHENSIVE METABOLIC PANEL
ALT: 19 U/L (ref 0–53)
AST: 23 U/L (ref 0–37)
Albumin: 4.1 g/dL (ref 3.5–5.2)
Alkaline Phosphatase: 88 U/L (ref 39–117)
BUN: 14 mg/dL (ref 6–23)
CO2: 25 mEq/L (ref 19–32)
Calcium: 9.3 mg/dL (ref 8.4–10.5)
Chloride: 95 mEq/L — ABNORMAL LOW (ref 96–112)
Creatinine, Ser: 0.64 mg/dL (ref 0.40–1.50)
GFR: 90.15 mL/min (ref >60.00)
Glucose, Bld: 239 mg/dL — ABNORMAL HIGH (ref 70–99)
Potassium: 4.6 mEq/L (ref 3.5–5.1)
Sodium: 133 mEq/L — ABNORMAL LOW (ref 135–145)
Total Bilirubin: 0.7 mg/dL (ref 0.2–1.2)
Total Protein: 6.2 g/dL (ref 6.0–8.3)

## 2022-12-23 LAB — LIPID PANEL
Cholesterol: 195 mg/dL (ref 0–200)
HDL: 97.2 mg/dL (ref >39.00)
LDL Cholesterol: 76 mg/dL (ref 0–99)
NonHDL: 97.78
Total CHOL/HDL Ratio: 2
Triglycerides: 110 mg/dL (ref 0.0–149.0)
VLDL: 22 mg/dL (ref 0.0–40.0)

## 2022-12-23 LAB — TSH: TSH: 1.62 u[IU]/mL (ref 0.35–5.50)

## 2022-12-23 LAB — VITAMIN D 25 HYDROXY (VIT D DEFICIENCY, FRACTURES): VITD: 30.05 ng/mL (ref 30.00–100.00)

## 2022-12-23 LAB — POCT INR: INR: 2.9 (ref 2.0–3.0)

## 2022-12-23 LAB — HEMOGLOBIN A1C: Hgb A1c MFr Bld: 10.5 % — ABNORMAL HIGH (ref 4.6–6.5)

## 2022-12-23 NOTE — Patient Instructions (Addendum)
Description   Continue taking warfarin 1 tablet daily except 1.5 tablets on Tuesdays and Saturdays.  Stay consistent with greens/protein drinks each week. Recheck INR in 4 weeks.  [facility]# [phone]

## 2022-12-24 ENCOUNTER — Encounter: Payer: Self-pay | Admitting: Emergency Medicine

## 2022-12-24 ENCOUNTER — Other Ambulatory Visit: Payer: Self-pay

## 2022-12-24 ENCOUNTER — Ambulatory Visit
Admission: EM | Admit: 2022-12-24 | Discharge: 2022-12-24 | Disposition: A | Discharge status: Home / Self Care | Payer: Medicare Other | Attending: Urgent Care | Admitting: Urgent Care

## 2022-12-24 ENCOUNTER — Encounter: Payer: Self-pay | Admitting: Family Medicine

## 2022-12-24 DIAGNOSIS — M2550 Pain in unspecified joint: Secondary | ICD-10-CM | POA: Diagnosis not present

## 2022-12-24 DIAGNOSIS — S20461A Insect bite (nonvenomous) of right back wall of thorax, initial encounter: Secondary | ICD-10-CM

## 2022-12-24 DIAGNOSIS — R5383 Other fatigue: Secondary | ICD-10-CM

## 2022-12-24 DIAGNOSIS — W57XXXA Bitten or stung by nonvenomous insect and other nonvenomous arthropods, initial encounter: Secondary | ICD-10-CM

## 2022-12-24 NOTE — ED Provider Notes (Signed)
[facility]    CSN: [id number] Arrival date & time: 12/24/22  1258      History   Chief Complaint Chief Complaint  Patient presents with   Insect Bite    HPI Luis Brown is a 77 y.o. male.   77 year old male with an extensive past medical history presents today due to concerns of joint pain and fatigue primarily.  He states symptoms have been occurring for the past 2 weeks.  He is concerned because he had a tick found on his back around the same timeframe of symptom onset, but he states the tick was only on him for a few hours and was not engorged.  Over the past 2 weeks patient states his wrist, hands, elbows, shoulders, and neck bilaterally are causing him stiffness and pain.  He denies any symptoms in the lower extremities primarily due to his severe neuropathy.  Patient denies any rash or fevers.  He states he does feel abnormally fatigued however he also has a mild headache which he normally does not get headaches.  He does report a history of constipation in the past, but this is not new.  He has had some upset stomach recently, but no diarrhea vomiting or nausea.     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   Esophageal reflux 02/10/2015   Fatigue    HTN (hypertension)    Hyperglycemia    preDM    Hyperlipidemia    Insomnia    Low back pain radiating to both legs 01/16/2017   Measles as a child   Overweight(278.02)    Peripheral neuropathy 08/07/2019   Personal history of colonic polyps 10/27/2012   Follows with [facility]   Pneumonia    " walking"   Preventative health care    Testosterone deficiency 05/23/2012   Wears glasses     Patient Active Problem List   Diagnosis Date Noted   Anemia 09/04/2022   Hypocalcemia 09/04/2022   Secondary hypercoagulable state (HCC) 03/16/2022   IPF (idiopathic pulmonary fibrosis) (HCC) 12/30/2021   Atrial fibrillation (HCC) 09/10/2021   Long term (current) use of anticoagulants 09/10/2021   Mixed hyperlipidemia 09/10/2021   Aspiration pneumonitis (HCC) 08/07/2021   Chronic respiratory failure with hypoxia (HCC) 08/07/2021   Pulmonary emboli (HCC) 07/16/2021   Bronchiectasis with acute exacerbation (HCC) 07/16/2021   History of DVT (deep vein thrombosis) 07/15/2021   Paroxysmal A-fib (HCC) 07/15/2021   COPD (chronic obstructive pulmonary disease) (HCC) 02/10/2021   Palpitations 02/01/2021   Wears glasses 01/11/2021   Pneumothorax, right 12/15/2020   Anxiety 12/01/2020   SOB (shortness of breath)    Nocturnal hypoxia 11/14/2020   S/P bronchoscopy 09/14/2020   Gastric ulcer 01/27/2020   H/O colonoscopy with polypectomy 01/27/2020   Protein-calorie malnutrition, severe 01/09/2020   Abdominal pain 12/30/2019   Weight loss, abnormal 12/30/2019   Change in bowel habits 12/30/2019   Medication monitoring encounter 08/26/2019   Peripheral neuropathy 08/07/2019   Hyponatremia 05/05/2019  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   Fusion of spine of thoracolumbar region 03/12/2019   Status post thoracic spinal fusion 02/12/2019   Thoracic spinal stenosis 04/17/2017   Painful orthopaedic hardware (HCC) 04/17/2017   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 (HCC) 04/17/2017   Low back pain radiating to both legs 01/16/2017   Esophageal reflux 02/10/2015   Essential hypertension 06/25/2014   Asthma 06/25/2014   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   Overweight  06/25/2014   Other abnormal glucose 06/25/2014   Spinal stenosis of lumbar region without neurogenic claudication 04/09/2014   Flatback syndrome 04/09/2014   Chicken pox 10/21/2013   History of colonic polyps 10/27/2012   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   Benign enlargement of prostate 04/23/2012   ED (erectile dysfunction) 04/23/2012   Allergic state 10/25/2011   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     Past Surgical History:  Procedure Laterality Date   ATRIAL FIBRILLATION ABLATION N/A 01/27/2022   Procedure: ATRIAL FIBRILLATION ABLATION;  Surgeon: Regan Lemming, MD;  Location: [facility];  Service: Cardiovascular;  Laterality: N/A;   BACK SURGERY  2012 and 1994   Dr Charlesetta Garibaldi, screws and cage in low back   BIOPSY  01/09/2020   Procedure: BIOPSY;  Surgeon: Kathi Der, MD;  Location: [facility];  Service: Gastroenterology;;   BRONCHIAL BIOPSY  11/17/2020   Procedure: BRONCHIAL BIOPSIES;  Surgeon: Tomma Lightning, MD;  Location: [facility];  Service: Endoscopy;;   BRONCHIAL BRUSHINGS  11/17/2020   Procedure: BRONCHIAL BRUSHINGS;  Surgeon: Tomma Lightning, MD;  Location: [facility];  Service: Endoscopy;;   BRONCHIAL WASHINGS  11/17/2020   Procedure: BRONCHIAL WASHINGS;  Surgeon: Tomma Lightning, MD;  Location: [facility];  Service: Endoscopy;;   COLONOSCOPY WITH PROPOFOL N/A 01/09/2020   Procedure: COLONOSCOPY WITH PROPOFOL;  Surgeon: Kathi Der, MD;  Location: [facility];  Service: Gastroenterology;  Laterality: N/A;   ESOPHAGOGASTRODUODENOSCOPY (EGD) WITH PROPOFOL N/A 01/09/2020   Procedure: ESOPHAGOGASTRODUODENOSCOPY (EGD) WITH PROPOFOL;  Surgeon: Kathi Der, MD;  Location: [facility];  Service: Gastroenterology;  Laterality: N/A;   EYE SURGERY Bilateral    2016    HARDWARE REVISION  03/12/2019   REVISION OF HARDWARE THORACIC TEN-THORACIC ELEVEN WITH APPLICATION OF ADDITIONAL RODS AND ROD SLEEVES THORACIC EIGHT-THORACIC TWELVE 12-LUMBAR ONE (N/A )   HEMOSTASIS CONTROL  11/17/2020   Procedure: HEMOSTASIS CONTROL;  Surgeon: Tomma Lightning, MD;  Location: [facility];  Service: Endoscopy;;   IR US GUIDE BX ASP/DRAIN  11/13/2020   LAMINECTOMY  02/12/2019   DECOMPRESSIVE LAMINECTOMY THORACIC NINE-THORACIC TEN AND THORACIC TEN-THORACIC ELEVEN, EXTENSION OF THORACOLUMBAR FUSION FROM THORACIC TEN TO THORACIC FIVE, LOCAL BONE GRAFT, ALLOGRAFT AND VIVIGEN (N/A)   POLYPECTOMY  01/09/2020   Procedure: POLYPECTOMY;  Surgeon: Kathi Der, MD;  Location: [facility];  Service: Gastroenterology;;   REMOVAL OF RODS AND PEDICLE SCREWS T5 AND T6, EXPLORATION OF FUSION, BIOPSY TRANSPEDICULAR T5 AND T6 (N/A )  09/14/2020   SPINAL FUSION N/A 02/12/2019   Procedure: DECOMPRESSIVE LAMINECTOMY THORACIC NINE-THORACIC TEN AND THORACIC TEN-THORACIC ELEVEN, EXTENSION OF THORACOLUMBAR FUSION FROM THORACIC TEN TO THORACIC FIVE, LOCAL BONE GRAFT, ALLOGRAFT AND VIVIGEN;  Surgeon: Kerrin Champagne, MD;  Location: [facility];  Service: Orthopedics;  Laterality: N/A;  DECOMPRESSIVE  LAMINECTOMY THORACIC NINE-THORACIC TEN AND THORACIC TEN-THORACIC ELEVEN, EXTENSION OF TH   SPINAL FUSION N/A 03/12/2019   Procedure: REVISION OF HARDWARE THORACIC TEN-THORACIC ELEVEN WITH APPLICATION OF ADDITIONAL RODS AND ROD SLEEVES THORACIC EIGHT-THORACIC TWELVE 12-LUMBAR ONE;  Surgeon: Kerrin Champagne, MD;  Location: [facility];  Service: Orthopedics;  Laterality: N/A;   TONSILLECTOMY     as child   torn rotator cuff  2010   right   VIDEO BRONCHOSCOPY N/A 11/17/2020   Procedure: VIDEO BRONCHOSCOPY WITH FLUORO;  Surgeon: Tomma Lightning, MD;  Location: [facility];  Service: Endoscopy;  Laterality: N/A;       Home Medications    Prior to Admission medications   Medication Sig Start Date End Date Taking?  Authorizing Provider  albuterol (PROVENTIL HFA) 108 (90 Base) MCG/ACT inhaler TAKE 2 PUFFS BY MOUTH EVERY 6 HOURS AS NEEDED FOR WHEEZE OR SHORTNESS OF BREATH 06/01/22   Kalman Shan, MD  atorvastatin (LIPITOR) 10 MG tablet TAKE 1 TABLET BY MOUTH EVERY DAY 08/16/22   Bradd Canary, MD  busPIRone (BUSPAR) 15 MG tablet Take 1 tablet (15 mg total) by mouth 3 (three) times daily. 12/13/22   Bradd Canary, MD  dextromethorphan-guaiFENesin [facility] DM) 30-600 MG 12hr tablet Take 1 tablet by mouth 2 (two) times daily.    [provider]  fluticasone (FLONASE) 50 MCG/ACT nasal spray SPRAY 2 SPRAYS INTO EACH NOSTRIL EVERY DAY 05/02/22   Saguier, Ramon Dredge, PA-C  Fluticasone-Umeclidin-Vilant (TRELEGY ELLIPTA) 200-62.5-25 MCG/ACT AEPB Inhale 1 puff into the lungs daily. 12/19/22   Kalman Shan, MD  glucose blood (CONTOUR NEXT TEST) test strip Check blood sugar once daily 02/25/19   Saguier, Ramon Dredge, PA-C  insulin glargine (LANTUS SOLOSTAR) 100 UNIT/ML Solostar Pen Inject 26 Units into the skin daily. 12/22/22   Bradd Canary, MD  insulin lispro (HUMALOG) 100 UNIT/ML injection Inject 0.03 mLs (3 Units total) into the skin 3 (three) times daily with meals. 11/04/22   Bradd Canary, MD  Insulin Pen Needle (B-D UF III MINI PEN NEEDLES) 31G X 5 MM MISC USE WITH LANTUS 12/01/22   Bradd Canary, MD  Insulin Pen Needle 32G X 4 MM MISC Use with insulin 12/15/22   Bradd Canary, MD  Insulin Syringes, Disposable, U-100 1 ML MISC USE WITH LANTUS 12/02/22   Bradd Canary, MD  JANUVIA 50 MG tablet TAKE 1 TABLET BY MOUTH EVERY DAY 11/14/22   Bradd Canary, MD  meclizine (ANTIVERT) 12.5 MG tablet Take 1 tablet (12.5 mg total) by mouth 3 (three) times daily as needed for dizziness. 11/14/22   Bradd Canary, MD  metFORMIN (GLUCOPHAGE) 1000 MG tablet TAKE 1 TABLET (1,000 MG TOTAL) BY MOUTH TWICE A DAY WITH FOOD 10/18/21   Saguier, Ramon Dredge, PA-C  Nintedanib (OFEV) 100 MG CAPS Take 1 capsule (100 mg total) by mouth  2 (two) times daily. **note dose change** Patient not taking: Reported on 12/24/2022 11/10/22   Kalman Shan, MD  oxyCODONE-acetaminophen (PERCOCET/ROXICET) 5-325 MG tablet Take 1 tablet by mouth every 4 (four) hours as needed for pain- frequency change 08/17/22     pantoprazole (PROTONIX) 40 MG tablet TAKE 1 TABLET BY MOUTH TWICE A DAY BEFORE A MEAL 11/30/21   Saguier, Ramon Dredge, PA-C  predniSONE (DELTASONE) 10 MG tablet TAKE 1 TABLET (10 MG TOTAL) BY MOUTH DAILY WITH BREAKFAST. 07/20/22   Kalman Shan, MD  pregabalin (LYRICA) 75 MG capsule TAKE 1 CAPSULE BY MOUTH TWICE A DAY Patient taking differently: Take 75  mg by mouth 2 (two) times daily. 03/09/20   Kerrin Champagne, MD  tamsulosin (FLOMAX) 0.4 MG CAPS capsule Take 2 capsules (0.8 mg total) by mouth daily. 12/02/22   Bradd Canary, MD  tiZANidine (ZANAFLEX) 4 MG tablet Take 1 and 1/2 tablets (6 mg total) by mouth every 4 hours. 08/11/22     venlafaxine XR (EFFEXOR-XR) 150 MG 24 hr capsule TAKE 1 CAPSULE BY MOUTH DAILY WITH BREAKFAST. 06/27/22   Bradd Canary, MD  warfarin (COUMADIN) 5 MG tablet TAKE 1 TO 2 TABLETS DAILY OR AS PRESCRIBED BY COUMADIN CLINIC 04/27/22   Camnitz, Andree Coss, MD  zolpidem (AMBIEN) 10 MG tablet Take 0.5-1 tablets (5-10 mg total) by mouth at bedtime as needed for sleep. 11/21/22   Bradd Canary, MD    Family History Family History  Problem Relation Age of Onset   Hypertension Mother    Diabetes Mother        type 2   Cancer Mother 38       breast in remission   Emphysema Father        smoker   COPD Father        smoker   Stroke Father 35       mini   Heart disease Father    Hypertension Sister    Hyperlipidemia Sister    Scoliosis Sister    Osteoporosis Sister    Hypertension Maternal Grandmother    Scoliosis Maternal Grandmother    Heart disease Maternal Grandfather    Heart disease Paternal Grandfather        smoker   Heart disease Daughter     Social History Social History   Tobacco Use    Smoking status: Former    Packs/day: 1.00    Years: 20.00    Additional pack years: 0.00    Total pack years: 20.00    Types: Cigarettes    Quit date: 07/18/1989    Years since quitting: 33.4   Smokeless tobacco: Never   Tobacco comments:    Former smoker 03/16/22  Vaping Use   Vaping Use: Never used  Substance Use Topics   Alcohol use: Yes    Alcohol/week: 2.0 standard drinks of alcohol    Types: 2 Standard drinks or equivalent per week    Comment: couple beers here and there 03/16/22   Drug use: No     Allergies   Daptomycin, Diltiazem, Ofev [nintedanib], and Pirfenidone   Review of Systems Review of Systems As per HPI  Physical Exam Triage Vital Signs ED Triage Vitals  Enc Vitals Group     BP 12/24/22 1334 (!) 164/84     Pulse Rate 12/24/22 1334 77     Resp 12/24/22 1334 16     Temp 12/24/22 1334 97.7 F (36.5 C)     Temp Source 12/24/22 1334 Oral     SpO2 12/24/22 1334 96 %     Weight 12/24/22 1336 180 lb 6.4 oz (81.8 kg)     Height --      Head Circumference --      Peak Flow --      Pain Score 12/24/22 1335 6     Pain Loc --      Pain Edu? --      Excl. in GC? --    No data found.  Updated Vital Signs BP (!) 164/84 (BP Location: Left Arm)   Pulse 77   Temp 97.7 F (36.5 C) (Oral)  Resp 16   Wt 180 lb 6.4 oz (81.8 kg)   SpO2 96%   BMI 25.88 kg/m   Visual Acuity Right Eye Distance:   Left Eye Distance:   Bilateral Distance:    Right Eye Near:   Left Eye Near:    Bilateral Near:     Physical Exam Vitals and nursing note reviewed. Exam conducted with a chaperone present.  Constitutional:      General: He is not in acute distress.    Appearance: Normal appearance. He is well-developed and normal weight. He is not ill-appearing, toxic-appearing or diaphoretic.  HENT:     Head: Normocephalic and atraumatic.     Right Ear: Tympanic membrane, ear canal and external ear normal. There is no impacted cerumen.     Left Ear: Tympanic membrane,  ear canal and external ear normal. There is no impacted cerumen.     Nose: Nose normal. No congestion or rhinorrhea.     Mouth/Throat:     Mouth: Mucous membranes are moist.     Pharynx: Oropharynx is clear. No oropharyngeal exudate or posterior oropharyngeal erythema.  Eyes:     General: No scleral icterus.       Right eye: No discharge.        Left eye: No discharge.     Extraocular Movements: Extraocular movements intact.     Conjunctiva/sclera: Conjunctivae normal.     Pupils: Pupils are equal, round, and reactive to light.  Cardiovascular:     Rate and Rhythm: Normal rate and regular rhythm.     Heart sounds: No murmur heard. Pulmonary:     Effort: Pulmonary effort is normal. No respiratory distress.     Breath sounds: No stridor. Rhonchi (B basilar) present. No wheezing or rales.  Chest:     Chest wall: No tenderness.  Abdominal:     Palpations: Abdomen is soft.     Tenderness: There is no abdominal tenderness.  Musculoskeletal:        General: No swelling. Normal range of motion.     Cervical back: Normal range of motion and neck supple. No rigidity or tenderness.     Comments: Stiffness noted to joints  Lymphadenopathy:     Cervical: No cervical adenopathy.  Skin:    General: Skin is warm and dry.     Capillary Refill: Capillary refill takes less than 2 seconds.     Coloration: Skin is not jaundiced.     Findings: No bruising, erythema or rash.  Neurological:     General: No focal deficit present.     Mental Status: He is alert and oriented to person, place, and time.     Sensory: Sensory deficit (BLE) present.  Psychiatric:        Mood and Affect: Mood normal.      UC Treatments / Results  Labs (all labs ordered are listed, but only abnormal results are displayed) Labs Reviewed  SEDIMENTATION RATE  LYME DISEASE SEROLOGY W/REFLEX  C-REACTIVE PROTEIN  URIC ACID    EKG   Radiology No results found.  Procedures Procedures (including critical care  time)  Medications Ordered in UC Medications - No data to display  Initial Impression / Assessment and Plan / UC Course  I have reviewed the triage vital signs and the nursing notes.  Pertinent labs & imaging results that were available during my care of the patient were reviewed by me and considered in my medical decision making (see chart for details).  Multiple joint pain -Lyme's disease serology, uric acid, and inflammatory markers were obtained today.  Patient already takes numerous medications for pain, and cannot take NSAIDs due to his Coumadin.  He may take Tylenol as needed for breakthrough pain.  He can also take his Percocet as needed for his discomfort.  Will call with results of the lab test to determine if he needs doxycycline for possible lymes Other fatigue -likely multifactorial, labs were drawn as above.  Patient did recently have labs drawn on 12/22/22. CBC shows platelets 451, which have been relatively stable since Feb. Neutrophils high and lymphocytes low, but also appear to be stable since Feb 2024. Tick bite -no evidence of rash or infection as stated location of tick.   Final Clinical Impressions(s) / UC Diagnoses   Final diagnoses:  Multiple joint pain  Other fatigue  Tick bite of right back wall of thorax, initial encounter     Discharge Instructions      We drew labs to further assess the cause of your fatigue and joint pain. You already take prednisone, a muscle relaxer, and Lyrica.  All of these are indicated for pain and/or swelling.  You cannot take anti-inflammatories due to your Coumadin. Take your Percocet as needed for your pain. If you are found to be positive for Lymes disease, we will start you on an antibiotic.     ED Prescriptions   None    PDMP not reviewed this encounter.   Maretta Bees, [place] 12/24/22 1525

## 2022-12-24 NOTE — Discharge Instructions (Addendum)
We drew labs to further assess the cause of your fatigue and joint pain. You already take prednisone, a muscle relaxer, and Lyrica.  All of these are indicated for pain and/or swelling.  You cannot take anti-inflammatories due to your Coumadin. Take your Percocet as needed for your pain. If you are found to be positive for Lymes disease, we will start you on an antibiotic.

## 2022-12-24 NOTE — ED Triage Notes (Addendum)
Tick bites x 2in the last 3 weeks  Denies rash Generalized neck & joint pain this past week  Pt here w/ wife Denies fevers Oxycodone at 1200 for chronic back pain

## 2022-12-26 ENCOUNTER — Encounter: Payer: Self-pay | Admitting: Family Medicine

## 2022-12-26 ENCOUNTER — Other Ambulatory Visit: Payer: Self-pay

## 2022-12-26 DIAGNOSIS — I10 Essential (primary) hypertension: Secondary | ICD-10-CM

## 2022-12-26 LAB — C-REACTIVE PROTEIN: CRP: 23 mg/L — ABNORMAL HIGH (ref 0–10)

## 2022-12-26 LAB — SEDIMENTATION RATE: Sed Rate: 16 mm/hr (ref 0–30)

## 2022-12-26 LAB — URIC ACID: Uric Acid: 2.9 mg/dL — ABNORMAL LOW (ref 3.8–8.4)

## 2022-12-26 LAB — LYME DISEASE SEROLOGY W/REFLEX: Lyme Total Antibody EIA: NEGATIVE

## 2022-12-26 MED ORDER — LANTUS SOLOSTAR 100 UNIT/ML ~~LOC~~ SOPN: 26.0000 [IU] | PEN_INJECTOR | Freq: Every day | SUBCUTANEOUS | 5 refills | Status: DC

## 2022-12-27 ENCOUNTER — Other Ambulatory Visit: Payer: Self-pay

## 2022-12-27 ENCOUNTER — Encounter: Payer: Self-pay | Admitting: Family Medicine

## 2022-12-27 MED ORDER — LANTUS SOLOSTAR 100 UNIT/ML ~~LOC~~ SOPN: 26.0000 [IU] | PEN_INJECTOR | Freq: Every day | SUBCUTANEOUS | 5 refills | Status: DC

## 2022-12-29 MED ORDER — LANTUS SOLOSTAR 100 UNIT/ML ~~LOC~~ SOPN: 26.0000 [IU] | PEN_INJECTOR | Freq: Every day | SUBCUTANEOUS | 5 refills | Status: DC

## 2022-12-29 NOTE — Addendum Note (Signed)
Addended byConrad Fairview D on: 12/29/2022 03:40 PM   Modules accepted: Orders

## 2022-12-30 ENCOUNTER — Ambulatory Visit (INDEPENDENT_AMBULATORY_CARE_PROVIDER_SITE_OTHER): Payer: Medicare Other | Admitting: Medical

## 2022-12-30 ENCOUNTER — Encounter: Payer: Self-pay | Admitting: Medical

## 2022-12-30 VITALS — BP 118/70 | HR 82 | Temp 97.9°F | Resp 18 | Ht 70.0 in | Wt 183.4 lb

## 2022-12-30 DIAGNOSIS — M2550 Pain in unspecified joint: Secondary | ICD-10-CM | POA: Diagnosis not present

## 2022-12-30 DIAGNOSIS — W57XXXD Bitten or stung by nonvenomous insect and other nonvenomous arthropods, subsequent encounter: Secondary | ICD-10-CM | POA: Diagnosis not present

## 2022-12-30 DIAGNOSIS — R6883 Chills (without fever): Secondary | ICD-10-CM

## 2022-12-30 DIAGNOSIS — M791 Myalgia, unspecified site: Secondary | ICD-10-CM

## 2022-12-30 DIAGNOSIS — S30860D Insect bite (nonvenomous) of lower back and pelvis, subsequent encounter: Secondary | ICD-10-CM

## 2022-12-30 LAB — CBC WITH DIFFERENTIAL/PLATELET
Basophils Absolute: 44 cells/uL (ref 0–200)
HCT: 37.1 % — ABNORMAL LOW (ref 38.5–50.0)
MCH: 29.3 pg (ref 27.0–33.0)
MCV: 89.2 fL (ref 80.0–100.0)
MPV: 8.9 fL (ref 7.5–12.5)
RDW: 14.1 % (ref 11.0–15.0)

## 2022-12-30 LAB — COMPREHENSIVE METABOLIC PANEL
Alkaline phosphatase (APISO): 189 U/L — ABNORMAL HIGH (ref 35–144)
BUN: 11 mg/dL (ref 7–25)
CO2: 29 mmol/L (ref 20–32)
Chloride: 90 mmol/L — ABNORMAL LOW (ref 98–110)
Globulin: 1.9 g/dL (calc) (ref 1.9–3.7)
Potassium: 4.9 mmol/L (ref 3.5–5.3)
Sodium: 128 mmol/L — ABNORMAL LOW (ref 135–146)
Total Protein: 5.9 g/dL — ABNORMAL LOW (ref 6.1–8.1)

## 2022-12-30 MED ORDER — PREDNISONE 5 MG PO TABS: ORAL_TABLET | ORAL | 0 refills | Status: DC

## 2022-12-30 NOTE — Patient Instructions (Signed)
1. Tick bite of back, subsequent encounter(only lyme study done 2 weeks ago) May prescribe doxy after following lab review and updating Dr. Abner Greenspan - CBC with Differential/Platelet - [place] spotted fvr abs pnl(IgG+IgM)  2. Arthralgia, unspecified joint Other inflammation lab recently norma. - Rheumatoid factor Moderate to severe pain. Rx taper prednisone.  3. Myalgia -sed rate  - CK (Creatine Kinase)  4. Chills - CBC with Differential/Platelet   While on taper prednsone follow your sliding scale humalog instruction and continue current other diabetic med.  Follow up date to be determined after lab review.

## 2022-12-30 NOTE — Progress Notes (Signed)
Subjective:    [name], male    DOB: January 06, 1946, 77 y.o.   MRN: [id number]  HPI  Pt in for follow up for joint pains.  He states joint pain came up about 2 weeks after tick found on his back.  Urgent care hx.  "77 year old male with an extensive past medical history presents today due to concerns of joint pain and fatigue primarily. He states symptoms have been occurring for the past 2 weeks. He is concerned because he had a tick found on his back around the same timeframe of symptom onset, but he states the tick was only on him for a few hours and was not engorged. Over the past 2 weeks patient states his wrist, hands, elbows, shoulders, and neck bilaterally are causing him stiffness and pain. He denies any symptoms in the lower extremities primarily due to his severe neuropathy. Patient denies any rash or fevers. He states he does feel abnormally fatigued however he also has a mild headache which he normally does not get headaches. He does report a history of constipation in the past, but this is not new. He has had some upset stomach recently, but no diarrhea vomiting or nausea."   Plan from UC. Multiple joint pain -Lyme's disease serology, uric acid, and inflammatory markers were obtained today.  Patient already takes numerous medications for pain, and cannot take NSAIDs due to his Coumadin.  He may take Tylenol as needed for breakthrough pain.  He can also take his Percocet as needed for his discomfort.  Will call with results of the lab test to determine if he needs doxycycline for possible lymes Other fatigue -likely multifactorial, labs were drawn as above.  Patient did recently have labs drawn on 12/22/22. CBC shows platelets 451, which have been relatively stable since Feb. Neutrophils high and lymphocytes low, but also appear to be stable since Feb 2024. Tick bite -no evidence of rash or infection as stated location of tick.   Pt states no treatment given.  Pt  tells me pain hands, wrist, elbows and shoulders. Also muscle of upper extremity.  Pt has oxycodone for pain. Also on prednisone but this is low dose.  Pt has diabetes and on both humalog and lantus.   Pt has sliding scale on his humalog.     Review of Systems  Constitutional:  Negative for chills, fatigue and fever.  Respiratory:  Negative for cough, chest tightness, shortness of breath and wheezing.   Cardiovascular:  Negative for chest pain and palpitations.  Gastrointestinal:  Negative for abdominal pain.  Musculoskeletal:  Positive for arthralgias and myalgias.  Skin:  Negative for rash.  Neurological:  Negative for dizziness and headaches.  Hematological:  Negative for adenopathy. Does not bruise/bleed easily.  Psychiatric/Behavioral:  Negative for behavioral problems, decreased concentration, dysphoric mood and hallucinations.     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   Esophageal reflux 02/10/2015   Fatigue    HTN (hypertension)  Hyperglycemia    preDM    Hyperlipidemia    Insomnia    Low back pain radiating to both legs 01/16/2017   Measles as a child   Overweight(278.02)    Peripheral neuropathy 08/07/2019   Personal history of colonic polyps 10/27/2012   Follows with [facility]   Pneumonia    " walking"   Preventative health care    Testosterone deficiency 05/23/2012   Wears glasses      Social History   Socioeconomic History   Marital status: Married    Spouse name: Not on file   Number of children:  Not on file   Years of education: Not on file   Highest education level: 12th grade  Occupational History   Not on file  Tobacco Use   Smoking status: Former    Packs/day: 1.00    Years: 20.00    Additional pack years: 0.00    Total pack years: 20.00    Types: Cigarettes    Quit date: 07/18/1989    Years since quitting: 33.4   Smokeless tobacco: Never   Tobacco comments:    Former smoker 03/16/22  Vaping Use   Vaping Use: Never used  Substance and Sexual Activity   Alcohol use: Yes    Alcohol/week: 2.0 standard drinks of alcohol    Types: 2 Standard drinks or equivalent per week    Comment: couple beers here and there 03/16/22   Drug use: No   Sexual activity: Yes    Comment: lives with wife, still working, no dietary restrictions, continues to exercise intermittently  Other Topics Concern   Not on file  Social History Narrative   Not on file   Social Determinants of Health   Financial Resource Strain: Low Risk  (10/20/2022)   Overall Financial Resource Strain (CARDIA)    Difficulty of Paying Living Expenses: Not hard at all  Food Insecurity: No Food Insecurity (10/20/2022)   Hunger Vital Sign    Worried About Running Out of Food in the Last Year: Never true    Ran Out of Food in the Last Year: Never true  Transportation Needs: No Transportation Needs (10/20/2022)   PRAPARE - Administrator, Civil Service (Medical): No    Lack of Transportation (Non-Medical): No  Physical Activity: Unknown (10/20/2022)   Exercise Vital Sign    Days of Exercise per Week: 0 days    Minutes of Exercise per Session: Not on file  Stress: Stress Concern Present (10/20/2022)   [employer] of Occupational Health - Occupational Stress Questionnaire    Feeling of Stress : To some extent  Social Connections: Socially Integrated (10/20/2022)   Social Connection and Isolation Panel [NHANES]    Frequency of Communication with Friends and Family: Three times a week    Frequency of Social  Gatherings with Friends and Family: Not on file    Attends Religious Services: More than 4 times per year    Active Member of [employer] or Organizations: Yes    Attends Banker Meetings: More than 4 times per year    Marital Status: Married  Catering manager Violence: Not At Risk (01/27/2021)   Humiliation, Afraid, Rape, and Kick questionnaire    Fear of Current or Ex-Partner: No    Emotionally Abused: No    Physically Abused: No    Sexually Abused: No    Past Surgical History:  Procedure Laterality Date   ATRIAL FIBRILLATION ABLATION N/A 01/27/2022   Procedure: ATRIAL FIBRILLATION  ABLATION;  Surgeon: Regan Lemming, MD;  Location: [facility] INVASIVE CV LAB;  Service: Cardiovascular;  Laterality: N/A;   BACK SURGERY  2012 and 1994   Dr Charlesetta Garibaldi, screws and cage in low back   BIOPSY  01/09/2020   Procedure: BIOPSY;  Surgeon: Kathi Der, MD;  Location: [facility];  Service: Gastroenterology;;   BRONCHIAL BIOPSY  11/17/2020   Procedure: BRONCHIAL BIOPSIES;  Surgeon: Tomma Lightning, MD;  Location: [facility];  Service: Endoscopy;;   BRONCHIAL BRUSHINGS  11/17/2020   Procedure: BRONCHIAL BRUSHINGS;  Surgeon: Tomma Lightning, MD;  Location: [facility];  Service: Endoscopy;;   BRONCHIAL WASHINGS  11/17/2020   Procedure: BRONCHIAL WASHINGS;  Surgeon: Tomma Lightning, MD;  Location: [facility];  Service: Endoscopy;;   COLONOSCOPY WITH PROPOFOL N/A 01/09/2020   Procedure: COLONOSCOPY WITH PROPOFOL;  Surgeon: Kathi Der, MD;  Location: [facility];  Service: Gastroenterology;  Laterality: N/A;   ESOPHAGOGASTRODUODENOSCOPY (EGD) WITH PROPOFOL N/A 01/09/2020   Procedure: ESOPHAGOGASTRODUODENOSCOPY (EGD) WITH PROPOFOL;  Surgeon: Kathi Der, MD;  Location: [facility];  Service: Gastroenterology;  Laterality: N/A;   EYE SURGERY Bilateral    2016   HARDWARE REVISION  03/12/2019   REVISION OF HARDWARE THORACIC TEN-THORACIC ELEVEN WITH APPLICATION OF ADDITIONAL  RODS AND ROD SLEEVES THORACIC EIGHT-THORACIC TWELVE 12-LUMBAR ONE (N/A )   HEMOSTASIS CONTROL  11/17/2020   Procedure: HEMOSTASIS CONTROL;  Surgeon: Tomma Lightning, MD;  Location: [facility];  Service: Endoscopy;;   IR US GUIDE BX ASP/DRAIN  11/13/2020   LAMINECTOMY  02/12/2019   DECOMPRESSIVE LAMINECTOMY THORACIC NINE-THORACIC TEN AND THORACIC TEN-THORACIC ELEVEN, EXTENSION OF THORACOLUMBAR FUSION FROM THORACIC TEN TO THORACIC FIVE, LOCAL BONE GRAFT, ALLOGRAFT AND VIVIGEN (N/A)   POLYPECTOMY  01/09/2020   Procedure: POLYPECTOMY;  Surgeon: Kathi Der, MD;  Location: [facility];  Service: Gastroenterology;;   REMOVAL OF RODS AND PEDICLE SCREWS T5 AND T6, EXPLORATION OF FUSION, BIOPSY TRANSPEDICULAR T5 AND T6 (N/A )  09/14/2020   SPINAL FUSION N/A 02/12/2019   Procedure: DECOMPRESSIVE LAMINECTOMY THORACIC NINE-THORACIC TEN AND THORACIC TEN-THORACIC ELEVEN, EXTENSION OF THORACOLUMBAR FUSION FROM THORACIC TEN TO THORACIC FIVE, LOCAL BONE GRAFT, ALLOGRAFT AND VIVIGEN;  Surgeon: Kerrin Champagne, MD;  Location: [facility] OR;  Service: Orthopedics;  Laterality: N/A;  DECOMPRESSIVE LAMINECTOMY THORACIC NINE-THORACIC TEN AND THORACIC TEN-THORACIC ELEVEN, EXTENSION OF TH   SPINAL FUSION N/A 03/12/2019   Procedure: REVISION OF HARDWARE THORACIC TEN-THORACIC ELEVEN WITH APPLICATION OF ADDITIONAL RODS AND ROD SLEEVES THORACIC EIGHT-THORACIC TWELVE 12-LUMBAR ONE;  Surgeon: Kerrin Champagne, MD;  Location: [facility] OR;  Service: Orthopedics;  Laterality: N/A;   TONSILLECTOMY     as child   torn rotator cuff  2010   right   VIDEO BRONCHOSCOPY N/A 11/17/2020   Procedure: VIDEO BRONCHOSCOPY WITH FLUORO;  Surgeon: Tomma Lightning, MD;  Location: [facility];  Service: Endoscopy;  Laterality: N/A;    Family History  Problem Relation Age of Onset   Hypertension Mother    Diabetes Mother        type 2   Cancer Mother 56       breast in remission   Emphysema Father        smoker   COPD Father        smoker    Stroke Father 72       mini   Heart disease Father    Hypertension Sister    Hyperlipidemia Sister    Scoliosis Sister    Osteoporosis Sister  Hypertension Maternal Grandmother    Scoliosis Maternal Grandmother    Heart disease Maternal Grandfather    Heart disease Paternal Grandfather        smoker   Heart disease Daughter     Allergies  Allergen Reactions   Daptomycin     Drug induced pneumonia    Diltiazem Other (See Comments)    Passed out   Ofev [Nintedanib] Diarrhea    fatigue   Pirfenidone Diarrhea    fatigue    Current Outpatient Medications on File Prior to Visit  Medication Sig Dispense Refill   albuterol (PROVENTIL HFA) 108 (90 Base) MCG/ACT inhaler TAKE 2 PUFFS BY MOUTH EVERY 6 HOURS AS NEEDED FOR WHEEZE OR SHORTNESS OF BREATH 8 g 5   atorvastatin (LIPITOR) 10 MG tablet TAKE 1 TABLET BY MOUTH EVERY DAY 90 tablet 1   busPIRone (BUSPAR) 15 MG tablet Take 1 tablet (15 mg total) by mouth 3 (three) times daily. 270 tablet 1   dextromethorphan-guaiFENesin (MUCINEX DM) 30-600 MG 12hr tablet Take 1 tablet by mouth 2 (two) times daily.     fluticasone (FLONASE) 50 MCG/ACT nasal spray SPRAY 2 SPRAYS INTO EACH NOSTRIL EVERY DAY 48 mL 1   Fluticasone-Umeclidin-Vilant (TRELEGY ELLIPTA) 200-62.5-25 MCG/ACT AEPB Inhale 1 puff into the lungs daily. 60 each 5   glucose blood (CONTOUR NEXT TEST) test strip Check blood sugar once daily 100 each 12   insulin glargine (LANTUS SOLOSTAR) 100 UNIT/ML Solostar Pen Inject 26 Units into the skin daily. 15 mL 5   insulin lispro (HUMALOG) 100 UNIT/ML injection Inject 0.03 mLs (3 Units total) into the skin 3 (three) times daily with meals. 100 mL 1   Insulin Pen Needle (B-D UF III MINI PEN NEEDLES) 31G X 5 MM MISC USE WITH LANTUS 100 each 3   Insulin Pen Needle 32G X 4 MM MISC Use with insulin 1 each 3   Insulin Syringes, Disposable, U-100 1 ML MISC USE WITH LANTUS 200 each 2   JANUVIA 50 MG tablet TAKE 1 TABLET BY MOUTH EVERY DAY 30 tablet  3   meclizine (ANTIVERT) 12.5 MG tablet Take 1 tablet (12.5 mg total) by mouth 3 (three) times daily as needed for dizziness. 30 tablet 2   metFORMIN (GLUCOPHAGE) 1000 MG tablet TAKE 1 TABLET (1,000 MG TOTAL) BY MOUTH TWICE A DAY WITH FOOD 180 tablet 0   oxyCODONE-acetaminophen (PERCOCET/ROXICET) 5-325 MG tablet Take 1 tablet by mouth every 4 (four) hours as needed for pain- frequency change 180 tablet 0   pantoprazole (PROTONIX) 40 MG tablet TAKE 1 TABLET BY MOUTH TWICE A DAY BEFORE A MEAL 180 tablet 1   predniSONE (DELTASONE) 10 MG tablet TAKE 1 TABLET (10 MG TOTAL) BY MOUTH DAILY WITH BREAKFAST. 30 tablet 3   pregabalin (LYRICA) 75 MG capsule TAKE 1 CAPSULE BY MOUTH TWICE A DAY (Patient taking differently: Take 75 mg by mouth 2 (two) times daily.) 60 capsule 3   tamsulosin (FLOMAX) 0.4 MG CAPS capsule Take 2 capsules (0.8 mg total) by mouth daily. 180 capsule 1   tiZANidine (ZANAFLEX) 4 MG tablet Take 1 and 1/2 tablets (6 mg total) by mouth every 4 hours. 180 tablet 2   venlafaxine XR (EFFEXOR-XR) 150 MG 24 hr capsule TAKE 1 CAPSULE BY MOUTH DAILY WITH BREAKFAST. 90 capsule 0   warfarin (COUMADIN) 5 MG tablet TAKE 1 TO 2 TABLETS DAILY OR AS PRESCRIBED BY COUMADIN CLINIC 130 tablet 1   zolpidem (AMBIEN) 10 MG tablet Take 0.5-1 tablets (5-10  mg total) by mouth at bedtime as needed for sleep. 15 tablet 3   Nintedanib (OFEV) 100 MG CAPS Take 1 capsule (100 mg total) by mouth 2 (two) times daily. **note dose change** (Patient not taking: Reported on 12/24/2022) 60 capsule 5   No current facility-administered medications on file prior to visit.    BP 118/70 (BP Location: Left Arm, Patient Position: Sitting, Cuff Size: Normal)   Pulse 82   Temp 97.9 F (36.6 C) (Oral)   Resp 18   Ht 5\' 10"  (1.778 m)   Wt 183 lb 6.4 oz (83.2 kg)   SpO2 97%   BMI 26.32 kg/m        Objective:   Physical Exam  General Mental Status- Alert. General Appearance- Not in acute distress.   Skin General: Color-  Normal Color. Moisture- Normal Moisture.  Neck Carotid Arteries- Normal color. Moisture- Normal Moisture. No carotid bruits. No JVD.  Chest and Lung Exam Auscultation: Breath Sounds:-Normal.  Cardiovascular Auscultation:Rythm- Regular. Murmurs & Other Heart Sounds:Auscultation of the heart reveals- No Murmurs.  Abdomen Inspection:-Inspeection Normal. Palpation/Percussion:Note:No mass. Palpation and Percussion of the abdomen reveal- Non Tender, Non Distended + BS, no rebound or guarding.    Neurologic Cranial Nerve exam:- CN III-XII intact(No nystagmus), symmetric smile. Strength:- 5/5 equal and symmetric strength both upper and lower extremities.   Hands- mild mcp and dip joint swelling of hands bilaterally. Wrist, elbows, shoulder and knees mild pain on rom.     Assessment & Plan:   Patient Instructions  1. Tick bite of back, subsequent encounter(only lyme study done 2 weeks ago) May prescribe doxy after following lab review and updating Dr. Abner Greenspan - CBC with Differential/Platelet - [place] spotted fvr abs pnl(IgG+IgM)  2. Arthralgia, unspecified joint Other inflammation lab recently norma. - Rheumatoid factor Moderate to severe pain. Rx taper prednisone.  3. Myalgia -sed rate  - CK (Creatine Kinase)  4. Chills - CBC with Differential/Platelet   While on taper prednsone follow your sliding scale humalog instruction and continue current other diabetic med.  Follow up date to be determined after lab review.   Esperanza Richters, PA-C

## 2023-01-02 LAB — CBC WITH DIFFERENTIAL/PLATELET
Basophils Relative: 0.5 %
Lymphs Abs: 229 cells/uL — ABNORMAL LOW (ref 850–3900)
Monocytes Relative: 10.9 %
Platelets: 344 10*3/uL (ref 140–400)
WBC: 8.8 10*3/uL (ref 3.8–10.8)

## 2023-01-02 LAB — RHEUMATOID FACTOR: Rheumatoid fact SerPl-aCnc: <10 IU/mL (ref <14)

## 2023-01-02 LAB — CK: Total CK: 38 U/L — ABNORMAL LOW (ref 44–196)

## 2023-01-02 LAB — COMPREHENSIVE METABOLIC PANEL
BUN/Creatinine Ratio: 19 (calc) (ref 6–22)
Glucose, Bld: 412 mg/dL — ABNORMAL HIGH (ref 65–99)

## 2023-01-03 LAB — CBC WITH DIFFERENTIAL/PLATELET
Absolute Monocytes: 959 cells/uL — ABNORMAL HIGH (ref 200–950)
Eosinophils Absolute: 70 cells/uL (ref 15–500)
Eosinophils Relative: 0.8 %
Hemoglobin: 12.2 g/dL — ABNORMAL LOW (ref 13.2–17.1)
MCHC: 32.9 g/dL (ref 32.0–36.0)
Neutro Abs: 7498 cells/uL (ref 1500–7800)
Neutrophils Relative %: 85.2 %
RBC: 4.16 10*6/uL — ABNORMAL LOW (ref 4.20–5.80)
Total Lymphocyte: 2.6 %

## 2023-01-03 LAB — COMPREHENSIVE METABOLIC PANEL
AG Ratio: 2.1 (calc) (ref 1.0–2.5)
ALT: 49 U/L — ABNORMAL HIGH (ref 9–46)
AST: 45 U/L — ABNORMAL HIGH (ref 10–35)
Albumin: 4 g/dL (ref 3.6–5.1)
Calcium: 9.2 mg/dL (ref 8.6–10.3)
Creat: 0.57 mg/dL — ABNORMAL LOW (ref 0.70–1.28)
Total Bilirubin: 0.8 mg/dL (ref 0.2–1.2)

## 2023-01-03 LAB — ROCKY MTN SPOTTED FVR ABS PNL(IGG+IGM)
RMSF IgG: NOT DETECTED
RMSF IgM: NOT DETECTED

## 2023-01-03 LAB — SEDIMENTATION RATE: Sed Rate: 34 mm/h — ABNORMAL HIGH (ref 0–20)

## 2023-01-04 ENCOUNTER — Encounter: Payer: Self-pay | Admitting: Family Medicine

## 2023-01-04 ENCOUNTER — Encounter: Payer: Self-pay | Admitting: Medical

## 2023-01-05 ENCOUNTER — Other Ambulatory Visit: Payer: Self-pay

## 2023-01-05 ENCOUNTER — Telehealth: Payer: Self-pay

## 2023-01-05 ENCOUNTER — Other Ambulatory Visit (INDEPENDENT_AMBULATORY_CARE_PROVIDER_SITE_OTHER): Payer: Medicare Other

## 2023-01-05 DIAGNOSIS — W57XXXD Bitten or stung by nonvenomous insect and other nonvenomous arthropods, subsequent encounter: Secondary | ICD-10-CM | POA: Diagnosis not present

## 2023-01-05 DIAGNOSIS — I10 Essential (primary) hypertension: Secondary | ICD-10-CM

## 2023-01-05 DIAGNOSIS — S30860D Insect bite (nonvenomous) of lower back and pelvis, subsequent encounter: Secondary | ICD-10-CM | POA: Diagnosis not present

## 2023-01-05 MED ORDER — DOXYCYCLINE HYCLATE 100 MG PO TABS: 100.0000 mg | ORAL_TABLET | Freq: Two times a day (BID) | ORAL | 0 refills | Status: DC

## 2023-01-05 NOTE — Telephone Encounter (Signed)
Sent to provider 

## 2023-01-06 LAB — COMPREHENSIVE METABOLIC PANEL
ALT: 84 U/L — ABNORMAL HIGH (ref 0–53)
AST: 85 U/L — ABNORMAL HIGH (ref 0–37)
Albumin: 3.7 g/dL (ref 3.5–5.2)
Alkaline Phosphatase: 290 U/L — ABNORMAL HIGH (ref 39–117)
BUN: 14 mg/dL (ref 6–23)
CO2: 27 mEq/L (ref 19–32)
Calcium: 8.9 mg/dL (ref 8.4–10.5)
Chloride: 93 mEq/L — ABNORMAL LOW (ref 96–112)
Creatinine, Ser: 0.68 mg/dL (ref 0.40–1.50)
GFR: 88.49 mL/min (ref >60.00)
Glucose, Bld: 170 mg/dL — ABNORMAL HIGH (ref 70–99)
Potassium: 5.2 mEq/L — ABNORMAL HIGH (ref 3.5–5.1)
Sodium: 129 mEq/L — ABNORMAL LOW (ref 135–145)
Total Bilirubin: 1.3 mg/dL — ABNORMAL HIGH (ref 0.2–1.2)
Total Protein: 5.8 g/dL — ABNORMAL LOW (ref 6.0–8.3)

## 2023-01-06 LAB — CBC WITH DIFFERENTIAL/PLATELET
Basophils Absolute: 0 10*3/uL (ref 0.0–0.1)
Basophils Relative: 0.3 % (ref 0.0–3.0)
Eosinophils Absolute: 0 10*3/uL (ref 0.0–0.7)
Eosinophils Relative: 0.1 % (ref 0.0–5.0)
HCT: 38.9 % — ABNORMAL LOW (ref 39.0–52.0)
Hemoglobin: 12.8 g/dL — ABNORMAL LOW (ref 13.0–17.0)
Lymphocytes Relative: 1.1 % — ABNORMAL LOW (ref 12.0–46.0)
Lymphs Abs: 0.1 10*3/uL — ABNORMAL LOW (ref 0.7–4.0)
MCHC: 32.8 g/dL (ref 30.0–36.0)
MCV: 89.4 fl (ref 78.0–100.0)
Monocytes Absolute: 0.3 10*3/uL (ref 0.1–1.0)
Monocytes Relative: 2.6 % — ABNORMAL LOW (ref 3.0–12.0)
Neutro Abs: 9.5 10*3/uL — ABNORMAL HIGH (ref 1.4–7.7)
Neutrophils Relative %: 95.9 % — ABNORMAL HIGH (ref 43.0–77.0)
Platelets: 438 10*3/uL — ABNORMAL HIGH (ref 150.0–400.0)
RBC: 4.36 Mil/uL (ref 4.22–5.81)
RDW: 16.2 % — ABNORMAL HIGH (ref 11.5–15.5)
WBC: 9.9 10*3/uL (ref 4.0–10.5)

## 2023-01-07 ENCOUNTER — Encounter (HOSPITAL_BASED_OUTPATIENT_CLINIC_OR_DEPARTMENT_OTHER): Payer: Self-pay | Admitting: Emergency Medicine

## 2023-01-07 ENCOUNTER — Emergency Department (HOSPITAL_BASED_OUTPATIENT_CLINIC_OR_DEPARTMENT_OTHER)
Admission: EM | Admit: 2023-01-07 | Discharge: 2023-01-07 | Disposition: A | Discharge status: Home / Self Care | Payer: Medicare Other | Attending: Emergency Medicine | Admitting: Emergency Medicine

## 2023-01-07 ENCOUNTER — Emergency Department (HOSPITAL_BASED_OUTPATIENT_CLINIC_OR_DEPARTMENT_OTHER): Payer: Medicare Other

## 2023-01-07 ENCOUNTER — Other Ambulatory Visit: Payer: Self-pay

## 2023-01-07 DIAGNOSIS — R7401 Elevation of levels of liver transaminase levels: Secondary | ICD-10-CM | POA: Diagnosis not present

## 2023-01-07 DIAGNOSIS — Z7984 Long term (current) use of oral hypoglycemic drugs: Secondary | ICD-10-CM | POA: Diagnosis not present

## 2023-01-07 DIAGNOSIS — E871 Hypo-osmolality and hyponatremia: Secondary | ICD-10-CM | POA: Diagnosis not present

## 2023-01-07 DIAGNOSIS — M2550 Pain in unspecified joint: Secondary | ICD-10-CM | POA: Diagnosis not present

## 2023-01-07 DIAGNOSIS — I4891 Unspecified atrial fibrillation: Secondary | ICD-10-CM | POA: Diagnosis not present

## 2023-01-07 DIAGNOSIS — E119 Type 2 diabetes mellitus without complications: Secondary | ICD-10-CM | POA: Insufficient documentation

## 2023-01-07 DIAGNOSIS — R5383 Other fatigue: Secondary | ICD-10-CM

## 2023-01-07 DIAGNOSIS — Z7901 Long term (current) use of anticoagulants: Secondary | ICD-10-CM | POA: Insufficient documentation

## 2023-01-07 DIAGNOSIS — K8020 Calculus of gallbladder without cholecystitis without obstruction: Secondary | ICD-10-CM | POA: Diagnosis not present

## 2023-01-07 DIAGNOSIS — R109 Unspecified abdominal pain: Secondary | ICD-10-CM | POA: Diagnosis not present

## 2023-01-07 DIAGNOSIS — K838 Other specified diseases of biliary tract: Secondary | ICD-10-CM | POA: Diagnosis not present

## 2023-01-07 DIAGNOSIS — R748 Abnormal levels of other serum enzymes: Secondary | ICD-10-CM | POA: Diagnosis not present

## 2023-01-07 DIAGNOSIS — Z794 Long term (current) use of insulin: Secondary | ICD-10-CM | POA: Insufficient documentation

## 2023-01-07 DIAGNOSIS — I10 Essential (primary) hypertension: Secondary | ICD-10-CM | POA: Diagnosis not present

## 2023-01-07 LAB — COMPREHENSIVE METABOLIC PANEL
ALT: 69 U/L — ABNORMAL HIGH (ref 0–44)
AST: 74 U/L — ABNORMAL HIGH (ref 15–41)
Albumin: 3.6 g/dL (ref 3.5–5.0)
Alkaline Phosphatase: 216 U/L — ABNORMAL HIGH (ref 38–126)
Anion gap: 12 (ref 5–15)
BUN: 13 mg/dL (ref 8–23)
CO2: 22 mmol/L (ref 22–32)
Calcium: 8.8 mg/dL — ABNORMAL LOW (ref 8.9–10.3)
Chloride: 91 mmol/L — ABNORMAL LOW (ref 98–111)
Creatinine, Ser: 0.64 mg/dL (ref 0.61–1.24)
GFR, Estimated: >60 mL/min (ref >60)
Glucose, Bld: 239 mg/dL — ABNORMAL HIGH (ref 70–99)
Potassium: 4.1 mmol/L (ref 3.5–5.1)
Sodium: 125 mmol/L — ABNORMAL LOW (ref 135–145)
Total Bilirubin: 1.1 mg/dL (ref 0.3–1.2)
Total Protein: 6.8 g/dL (ref 6.5–8.1)

## 2023-01-07 LAB — CBC WITH DIFFERENTIAL/PLATELET
Abs Immature Granulocytes: 0.05 10*3/uL (ref 0.00–0.07)
Basophils Absolute: 0 10*3/uL (ref 0.0–0.1)
Basophils Relative: 0 %
Eosinophils Absolute: 0 10*3/uL (ref 0.0–0.5)
Eosinophils Relative: 0 %
HCT: 38.8 % — ABNORMAL LOW (ref 39.0–52.0)
Hemoglobin: 13.2 g/dL (ref 13.0–17.0)
Immature Granulocytes: 1 %
Lymphocytes Relative: 1 %
Lymphs Abs: 0.1 10*3/uL — ABNORMAL LOW (ref 0.7–4.0)
MCH: 29.4 pg (ref 26.0–34.0)
MCHC: 34 g/dL (ref 30.0–36.0)
MCV: 86.4 fL (ref 80.0–100.0)
Monocytes Absolute: 0.5 10*3/uL (ref 0.1–1.0)
Monocytes Relative: 5 %
Neutro Abs: 10.3 10*3/uL — ABNORMAL HIGH (ref 1.7–7.7)
Neutrophils Relative %: 93 %
Platelets: 386 10*3/uL (ref 150–400)
RBC: 4.49 MIL/uL (ref 4.22–5.81)
RDW: 14.6 % (ref 11.5–15.5)
WBC: 11.1 10*3/uL — ABNORMAL HIGH (ref 4.0–10.5)
nRBC: 0 % (ref 0.0–0.2)

## 2023-01-07 LAB — PROTIME-INR
INR: 1.9 — ABNORMAL HIGH (ref 0.8–1.2)
Prothrombin Time: 22.3 seconds — ABNORMAL HIGH (ref 11.4–15.2)

## 2023-01-07 MED ORDER — SODIUM CHLORIDE 0.9 % IV BOLUS
500.0000 mL | Freq: Once | INTRAVENOUS | Status: AC
  Administered 2023-01-07: 500 mL via INTRAVENOUS

## 2023-01-07 NOTE — ED Notes (Signed)
US at bedside

## 2023-01-07 NOTE — Discharge Instructions (Addendum)
You were seen in the emergency department for your fatigue and abdominal pain.  Your workup showed that your liver enzymes are still mildly elevated and you do have gallstones in your gallbladder but no signs of gallbladder infection.  Your sodium level is mildly low but appears to be chronically low and is unclear what is causing this at this time.  You should follow-up with your primary doctor to have your liver numbers and sodium numbers rechecked.  You should also follow-up with your GI doctor who can reassess your liver numbers and to determine the cause of your chronically dilated liver duct.  You can follow-up with general surgery as needed if you are having frequent gallbladder flares.  You should return to the emergency department if you have fevers, significantly worsening pain, repetitive vomiting or if you have any other new or concerning symptoms.

## 2023-01-07 NOTE — ED Triage Notes (Signed)
Pt c/o generalized body aches and feeling unwell x 3 weeks with recent rx for prednisone, taken w/o relief. Pt has been tested for [place] spotted fever and lyme disease but both tests were negative.

## 2023-01-07 NOTE — ED Provider Notes (Signed)
EMERGENCY DEPARTMENT AT [facility] Provider Note   CSN: [id number] Arrival date & time: 01/07/23  1225     History  Chief Complaint  Patient presents with   Generalized Body Aches    Luis Brown is a 77 y.o. male.  Patient is a 77 year old male with a past medical history of A-fib on Coumadin, diabetes, interstitial lung disease, chronic back pain on chronic pain control presenting to the emergency department with nausea and fatigue.  The patient states that he is felt like he has been "going downhill" for the past 3 weeks.  He states that he has had increased fatigue and has been feeling generally unwell.  He states he was initially having joint pains in his hands and his elbows and was started on increased prednisone on Monday and those pains have resolved.  He states that he does have intermittent nausea with stomach upset that feels like a bloating and cramping across the top of his abdomen.  He states that this pain has been coming on more frequently but does not seem to be associated with eating.  He states he has been having normal bowel movements and denies any black or bloody stools and denies any dysuria or hematuria.  He denies any recent travel or known sick contacts and denies significant Tylenol use.  He states that he was tested for Lyme and [facility] spotted fever by his primary doctor and that these tests were negative though reports he was prophylactically started on doxycycline on Thursday.  He states he was recommended by his primary doctor to be evaluated in the ER if his symptoms have not improved by the weekend.  He denies any tobacco, alcohol or drug use.  The history is provided by the spouse and the patient.       Home Medications Prior to Admission medications   Medication Sig Start Date End Date Taking? Authorizing Provider  doxycycline (VIBRA-TABS) 100 MG tablet Take 1 tablet (100 mg total) by mouth 2 (two) times daily. 01/05/23    Bradd Canary, MD  albuterol (PROVENTIL HFA) 108 (90 Base) MCG/ACT inhaler TAKE 2 PUFFS BY MOUTH EVERY 6 HOURS AS NEEDED FOR WHEEZE OR SHORTNESS OF BREATH 06/01/22   Kalman Shan, MD  atorvastatin (LIPITOR) 10 MG tablet TAKE 1 TABLET BY MOUTH EVERY DAY 08/16/22   Bradd Canary, MD  busPIRone (BUSPAR) 15 MG tablet Take 1 tablet (15 mg total) by mouth 3 (three) times daily. 12/13/22   Bradd Canary, MD  dextromethorphan-guaiFENesin [facility] DM) 30-600 MG 12hr tablet Take 1 tablet by mouth 2 (two) times daily.    [provider]  fluticasone (FLONASE) 50 MCG/ACT nasal spray SPRAY 2 SPRAYS INTO EACH NOSTRIL EVERY DAY 05/02/22   Saguier, Ramon Dredge, PA-C  Fluticasone-Umeclidin-Vilant (TRELEGY ELLIPTA) 200-62.5-25 MCG/ACT AEPB Inhale 1 puff into the lungs daily. 12/19/22   Kalman Shan, MD  glucose blood (CONTOUR NEXT TEST) test strip Check blood sugar once daily 02/25/19   Saguier, Ramon Dredge, PA-C  insulin glargine (LANTUS SOLOSTAR) 100 UNIT/ML Solostar Pen Inject 26 Units into the skin daily. 12/29/22   Bradd Canary, MD  insulin lispro (HUMALOG) 100 UNIT/ML injection Inject 0.03 mLs (3 Units total) into the skin 3 (three) times daily with meals. 11/04/22   Bradd Canary, MD  Insulin Pen Needle (B-D UF III MINI PEN NEEDLES) 31G X 5 MM MISC USE WITH LANTUS 12/01/22   Bradd Canary, MD  Insulin Pen Needle 32G X  4 MM MISC Use with insulin 12/15/22   Bradd Canary, MD  Insulin Syringes, Disposable, U-100 1 ML MISC USE WITH LANTUS 12/02/22   Bradd Canary, MD  JANUVIA 50 MG tablet TAKE 1 TABLET BY MOUTH EVERY DAY 11/14/22   Bradd Canary, MD  meclizine (ANTIVERT) 12.5 MG tablet Take 1 tablet (12.5 mg total) by mouth 3 (three) times daily as needed for dizziness. 11/14/22   Bradd Canary, MD  metFORMIN (GLUCOPHAGE) 1000 MG tablet TAKE 1 TABLET (1,000 MG TOTAL) BY MOUTH TWICE A DAY WITH FOOD 10/18/21   Saguier, Ramon Dredge, PA-C  Nintedanib (OFEV) 100 MG CAPS Take 1 capsule (100 mg total) by mouth  2 (two) times daily. **note dose change** Patient not taking: Reported on 12/24/2022 11/10/22   Kalman Shan, MD  oxyCODONE-acetaminophen (PERCOCET/ROXICET) 5-325 MG tablet Take 1 tablet by mouth every 4 (four) hours as needed for pain- frequency change 08/17/22     pantoprazole (PROTONIX) 40 MG tablet TAKE 1 TABLET BY MOUTH TWICE A DAY BEFORE A MEAL 11/30/21   Saguier, Ramon Dredge, PA-C  predniSONE (DELTASONE) 10 MG tablet TAKE 1 TABLET (10 MG TOTAL) BY MOUTH DAILY WITH BREAKFAST. 07/20/22   Kalman Shan, MD  predniSONE (DELTASONE) 5 MG tablet 5 tab po day 1, 4 tab po day 2, 3 tab po day 4, 2 tab po day 5 and 1 tab po day 6. 12/30/22   Saguier, Ramon Dredge, PA-C  pregabalin (LYRICA) 75 MG capsule TAKE 1 CAPSULE BY MOUTH TWICE A DAY Patient taking differently: Take 75 mg by mouth 2 (two) times daily. 03/09/20   Kerrin Champagne, MD  tamsulosin (FLOMAX) 0.4 MG CAPS capsule Take 2 capsules (0.8 mg total) by mouth daily. 12/02/22   Bradd Canary, MD  tiZANidine (ZANAFLEX) 4 MG tablet Take 1 and 1/2 tablets (6 mg total) by mouth every 4 hours. 08/11/22     venlafaxine XR (EFFEXOR-XR) 150 MG 24 hr capsule TAKE 1 CAPSULE BY MOUTH DAILY WITH BREAKFAST. 06/27/22   Bradd Canary, MD  warfarin (COUMADIN) 5 MG tablet TAKE 1 TO 2 TABLETS DAILY OR AS PRESCRIBED BY COUMADIN CLINIC 04/27/22   Camnitz, Andree Coss, MD  zolpidem (AMBIEN) 10 MG tablet Take 0.5-1 tablets (5-10 mg total) by mouth at bedtime as needed for sleep. 11/21/22   Bradd Canary, MD      Allergies    Daptomycin, Diltiazem, Ofev [nintedanib], and Pirfenidone    Review of Systems   Review of Systems  Physical Exam Updated Vital Signs BP (!) 173/88 (BP Location: Left Arm)   Pulse 67   Temp 97.8 F (36.6 C) (Oral)   Resp 17   Ht 5\' 10"  (1.778 m)   Wt 83 kg   SpO2 100%   BMI 26.26 kg/m  Physical Exam Vitals and nursing note reviewed.  Constitutional:      General: He is not in acute distress.    Appearance: Normal appearance.  HENT:      Head: Normocephalic and atraumatic.     Nose: Nose normal.     Mouth/Throat:     Mouth: Mucous membranes are moist.     Pharynx: Oropharynx is clear.  Eyes:     Extraocular Movements: Extraocular movements intact.     Conjunctiva/sclera: Conjunctivae normal.  Cardiovascular:     Rate and Rhythm: Normal rate and regular rhythm.     Heart sounds: Normal heart sounds.  Pulmonary:     Effort: Pulmonary effort is normal.  Breath sounds: Normal breath sounds.  Abdominal:     General: Abdomen is flat.     Palpations: Abdomen is soft.     Tenderness: There is abdominal tenderness (Mild, epigastrium). There is no guarding or rebound.  Musculoskeletal:        General: Normal range of motion.     Cervical back: Normal range of motion and neck supple.     Right lower leg: No edema.     Left lower leg: No edema.  Skin:    General: Skin is warm and dry.  Neurological:     General: No focal deficit present.     Mental Status: He is alert and oriented to person, place, and time.  Psychiatric:        Mood and Affect: Mood normal.        Behavior: Behavior normal.     ED Results / Procedures / Treatments   Labs (all labs ordered are listed, but only abnormal results are displayed) Labs Reviewed  CBC WITH DIFFERENTIAL/PLATELET - Abnormal; Notable for the following components:      Result Value   WBC 11.1 (*)    HCT 38.8 (*)    Neutro Abs 10.3 (*)    Lymphs Abs 0.1 (*)    All other components within normal limits  COMPREHENSIVE METABOLIC PANEL - Abnormal; Notable for the following components:   Sodium 125 (*)    Chloride 91 (*)    Glucose, Bld 239 (*)    Calcium 8.8 (*)    AST 74 (*)    ALT 69 (*)    Alkaline Phosphatase 216 (*)    All other components within normal limits  PROTIME-INR - Abnormal; Notable for the following components:   Prothrombin Time 22.3 (*)    INR 1.9 (*)    All other components within normal limits    EKG EKG Interpretation  Date/Time:  Saturday  January 07 2023 15:30:19 EDT Ventricular Rate:  66 PR Interval:  128 QRS Duration: 110 QT Interval:  421 QTC Calculation: 442 R Axis:   7 Text Interpretation: Sinus rhythm Probable left atrial enlargement Abnormal R-wave progression, late transition No significant change since last tracing Confirmed by Elayne Snare ([id number]) on 01/07/2023 3:51:32 PM  Radiology US Abdomen Limited RUQ (LIVER/GB)  Result Date: 01/07/2023 CLINICAL DATA:  Elevated alkaline phosphatase level. Generalized abdominal pain. EXAM: ULTRASOUND ABDOMEN LIMITED RIGHT UPPER QUADRANT COMPARISON:  MRI of August 08, 2020. FINDINGS: Gallbladder: Small gallstones and tumefactive sludge are noted within the gallbladder lumen. Significant gallbladder wall thickening or pericholecystic fluid is noted. No sonographic Murphy's sign is noted. Common bile duct: Diameter: 14 mm which is dilated but not significantly changed compared to prior MRI. Liver: No focal lesion identified. Within normal limits in parenchymal echogenicity. Portal vein is patent on color Doppler imaging with normal direction of blood flow towards the liver. Other: None. IMPRESSION: Cholelithiasis and sludge is noted. Common bile duct dilatation is noted but is not significantly changed compared to prior MRI exam of January 06, 2021. Electronically Signed   By: Lupita Raider M.D.   On: 01/07/2023 16:24    Procedures Procedures    Medications Ordered in ED Medications  sodium chloride 0.9 % bolus 500 mL (0 mLs Intravenous Stopped 01/07/23 1643)    ED Course/ Medical Decision Making/ A&P Clinical Course as of 01/07/23 1702  Sat Jan 07, 2023  1631 INR at appropriate level for coumadin. RUQ [place] with cholelithiasis without cholecystitis. May be contributing  to some of his abdominal pain and nausea. Ductal dilation stable from 2022 making choledocholithiasis less likely.  [VK]    Clinical Course User Index [VK] Rexford Maus, DO                              Medical Decision Making This patient presents to the ED with chief complaint(s) of fatigue with pertinent past medical history of a fib, pulmonary fibrosis, diabetes, chronic pain which further complicates the presenting complaint. The complaint involves an extensive differential diagnosis and also carries with it a high risk of complications and morbidity.    The differential diagnosis includes dehydration, electrolyte abnormality, anemia, atypical ACS, arrhythmia, gastritis, GERD, cholelithiasis, cholecystitis, pancreatitis less likely due to chronicity of the symptoms, viral syndrome  Additional history obtained: Additional history obtained from spouse Records reviewed Primary Care Documents  ED Course and Reassessment: On patient's arrival he is hemodynamically stable in no acute distress.  He was initially evaluated by triage and had labs performed.  Labs showed hyponatremia to 125, corrected to 127 due to his hyperglycemia. Review of his labs he does have a history of chronic hyponatremia of unknown etiology.  He has a mild transaminitis with elevated alk phos that is stable from labs performed as outpatient few days ago though increased from his baseline with a mild leukocytosis.  His associated recent epigastric pain will have a right upper quadrant ultrasound as well as EKG performed.  He will be given gentle fluids for his hyperglycemia and hyponatremia and will be closely reassessed.  Independent labs interpretation:  The following labs were independently interpreted: chronic hyponatremia, mild transaminitis, mild hyperglycemia  Independent visualization of imaging: - I independently visualized the following imaging with scope of interpretation limited to determining acute life threatening conditions related to emergency care: RUQ [place], which revealed cholelithiasis without cholecystitis, chronic ductal dilation  Consultation: - Consulted or discussed management/test interpretation w/  external professional: N/A  Consideration for admission or further workup: Patient has no emergent conditions requiring admission or further work-up at this time and is stable for discharge home with primary care follow-up  Social Determinants of health: N/A    Amount and/or Complexity of Data Reviewed Labs: ordered. Radiology: ordered.           Final Clinical Impression(s) / ED Diagnoses Final diagnoses:  Calculus of gallbladder without cholecystitis without obstruction  Transaminitis  Chronic hyponatremia  Fatigue, unspecified type    Rx / DC Orders ED Discharge Orders     None         Rexford Maus, DO 01/07/23 1702

## 2023-01-08 ENCOUNTER — Encounter: Payer: Self-pay | Admitting: Family Medicine

## 2023-01-09 ENCOUNTER — Ambulatory Visit: Payer: Medicare Other | Admitting: Physical Therapy

## 2023-01-09 ENCOUNTER — Encounter: Payer: Self-pay | Admitting: Family Medicine

## 2023-01-09 ENCOUNTER — Other Ambulatory Visit: Payer: Medicare Other

## 2023-01-09 NOTE — Telephone Encounter (Signed)
Called spoke with pt was advised of recommendation to see surgeon for  Gallstones if he like. Pt stated he can't have the surgery and pt has lab appt for tomorrow.

## 2023-01-10 ENCOUNTER — Encounter: Payer: Self-pay | Admitting: Family Medicine

## 2023-01-10 ENCOUNTER — Other Ambulatory Visit (INDEPENDENT_AMBULATORY_CARE_PROVIDER_SITE_OTHER): Payer: Medicare Other

## 2023-01-10 DIAGNOSIS — W57XXXD Bitten or stung by nonvenomous insect and other nonvenomous arthropods, subsequent encounter: Secondary | ICD-10-CM

## 2023-01-10 DIAGNOSIS — M961 Postlaminectomy syndrome, not elsewhere classified: Secondary | ICD-10-CM | POA: Diagnosis not present

## 2023-01-10 DIAGNOSIS — S30860D Insect bite (nonvenomous) of lower back and pelvis, subsequent encounter: Secondary | ICD-10-CM

## 2023-01-10 DIAGNOSIS — M150 Primary generalized (osteo)arthritis: Secondary | ICD-10-CM | POA: Diagnosis not present

## 2023-01-10 DIAGNOSIS — E1142 Type 2 diabetes mellitus with diabetic polyneuropathy: Secondary | ICD-10-CM | POA: Diagnosis not present

## 2023-01-10 DIAGNOSIS — K829 Disease of gallbladder, unspecified: Secondary | ICD-10-CM

## 2023-01-10 DIAGNOSIS — G894 Chronic pain syndrome: Secondary | ICD-10-CM | POA: Diagnosis not present

## 2023-01-10 DIAGNOSIS — I10 Essential (primary) hypertension: Secondary | ICD-10-CM

## 2023-01-10 LAB — CBC WITH DIFFERENTIAL/PLATELET
Basophils Absolute: 0 10*3/uL (ref 0.0–0.1)
Basophils Relative: 0.3 % (ref 0.0–3.0)
Eosinophils Absolute: 0.1 10*3/uL (ref 0.0–0.7)
Eosinophils Relative: 0.7 % (ref 0.0–5.0)
HCT: 37.9 % — ABNORMAL LOW (ref 39.0–52.0)
Hemoglobin: 12.4 g/dL — ABNORMAL LOW (ref 13.0–17.0)
Lymphocytes Relative: 1.7 % — ABNORMAL LOW (ref 12.0–46.0)
Lymphs Abs: 0.2 10*3/uL — ABNORMAL LOW (ref 0.7–4.0)
MCHC: 32.6 g/dL (ref 30.0–36.0)
MCV: 88.9 fl (ref 78.0–100.0)
Monocytes Absolute: 1.1 10*3/uL — ABNORMAL HIGH (ref 0.1–1.0)
Monocytes Relative: 9.4 % (ref 3.0–12.0)
Neutro Abs: 10.1 10*3/uL — ABNORMAL HIGH (ref 1.4–7.7)
Neutrophils Relative %: 87.9 % — ABNORMAL HIGH (ref 43.0–77.0)
Platelets: 461 10*3/uL — ABNORMAL HIGH (ref 150.0–400.0)
RBC: 4.27 Mil/uL (ref 4.22–5.81)
RDW: 15.7 % — ABNORMAL HIGH (ref 11.5–15.5)
WBC: 11.4 10*3/uL — ABNORMAL HIGH (ref 4.0–10.5)

## 2023-01-10 LAB — COMPREHENSIVE METABOLIC PANEL
ALT: 45 U/L (ref 0–53)
AST: 41 U/L — ABNORMAL HIGH (ref 0–37)
Albumin: 4 g/dL (ref 3.5–5.2)
Alkaline Phosphatase: 157 U/L — ABNORMAL HIGH (ref 39–117)
BUN: 13 mg/dL (ref 6–23)
CO2: 30 mEq/L (ref 19–32)
Calcium: 9.5 mg/dL (ref 8.4–10.5)
Chloride: 93 mEq/L — ABNORMAL LOW (ref 96–112)
Creatinine, Ser: 0.64 mg/dL (ref 0.40–1.50)
GFR: 90.12 mL/min (ref >60.00)
Glucose, Bld: 143 mg/dL — ABNORMAL HIGH (ref 70–99)
Potassium: 4.7 mEq/L (ref 3.5–5.1)
Sodium: 132 mEq/L — ABNORMAL LOW (ref 135–145)
Total Bilirubin: 0.9 mg/dL (ref 0.2–1.2)
Total Protein: 5.8 g/dL — ABNORMAL LOW (ref 6.0–8.3)

## 2023-01-11 ENCOUNTER — Ambulatory Visit: Payer: Medicare Other

## 2023-01-11 ENCOUNTER — Telehealth: Payer: Self-pay | Admitting: Family Medicine

## 2023-01-11 MED ORDER — PANTOPRAZOLE SODIUM 40 MG PO TBEC: 40.0000 mg | DELAYED_RELEASE_TABLET | Freq: Two times a day (BID) | ORAL | 1 refills | Status: DC

## 2023-01-11 NOTE — Telephone Encounter (Signed)
Called pt lvm to call our office back to let us if pain management doc going to order imaging are Do we need to order the imaging.Needing to discuss gallbladder managed by general surgeon per Dr.Blyth.

## 2023-01-11 NOTE — Telephone Encounter (Signed)
Pt stated he wanted to let [name] know he is not sure if he is going to get the imaging done.

## 2023-01-12 ENCOUNTER — Other Ambulatory Visit: Payer: Medicare Other

## 2023-01-12 ENCOUNTER — Encounter: Payer: Self-pay | Admitting: Family Medicine

## 2023-01-12 ENCOUNTER — Other Ambulatory Visit: Payer: Self-pay | Admitting: Family Medicine

## 2023-01-12 DIAGNOSIS — K8020 Calculus of gallbladder without cholecystitis without obstruction: Secondary | ICD-10-CM

## 2023-01-12 DIAGNOSIS — R1011 Right upper quadrant pain: Secondary | ICD-10-CM

## 2023-01-12 DIAGNOSIS — M25511 Pain in right shoulder: Secondary | ICD-10-CM

## 2023-01-12 NOTE — Telephone Encounter (Signed)
Spoke with pt and sent provider message regarding pt request.

## 2023-01-13 ENCOUNTER — Telehealth: Payer: Self-pay

## 2023-01-13 ENCOUNTER — Ambulatory Visit (INDEPENDENT_AMBULATORY_CARE_PROVIDER_SITE_OTHER): Payer: Medicare Other | Admitting: *Deleted

## 2023-01-13 DIAGNOSIS — Z0000 Encounter for general adult medical examination without abnormal findings: Secondary | ICD-10-CM | POA: Diagnosis not present

## 2023-01-13 NOTE — Progress Notes (Signed)
Subjective:   Luis Brown is a 77 y.o. male who presents for Medicare Annual/Subsequent preventive examination.  Visit Complete: Virtual  I connected with  Luis Brown on 01/13/23 by a audio enabled telemedicine application and verified that I am speaking with the correct person using two identifiers.  Patient Location: Home  Provider Location: Office/Clinic  I discussed the limitations of evaluation and management by telemedicine. The patient expressed understanding and agreed to proceed.  Patient Medicare AWV questionnaire was completed by the patient on 01/13/23; I have confirmed that all information answered by patient is correct and no changes since this date.  Review of Systems     Cardiac Risk Factors include: advanced age (>80men, >81 women);male gender;dyslipidemia;diabetes mellitus;hypertension     Objective:    Today's Vitals   01/13/23 1411  PainSc: 7    There is no height or weight on file to calculate BMI.     01/13/2023    2:26 PM 01/07/2023   12:35 PM 10/23/2022   12:59 PM 04/25/2022    9:39 AM 01/27/2022    9:37 AM 10/05/2021    2:56 PM 08/07/2021    3:07 PM  Advanced Directives  Does Patient Have a Medical Advance Directive? Yes Yes No Yes Yes Yes Yes  Type of Advertising copywriter  Living will;Healthcare Power of [employer] Power of [place];Living will Healthcare Power of [employer] Power of Attorney  Does patient want to make changes to medical advance directive?    No - Patient declined   No - Patient declined  Copy of Healthcare Power of Attorney in Chart? No - copy requested     Yes - validated most recent copy scanned in chart (See row information) Yes - validated most recent copy scanned in chart (See row information)    Current Medications (verified) Outpatient Encounter Medications as of 01/13/2023  Medication Sig   albuterol (PROVENTIL HFA) 108 (90 Base) MCG/ACT  inhaler TAKE 2 PUFFS BY MOUTH EVERY 6 HOURS AS NEEDED FOR WHEEZE OR SHORTNESS OF BREATH   atorvastatin (LIPITOR) 10 MG tablet TAKE 1 TABLET BY MOUTH EVERY DAY   busPIRone (BUSPAR) 15 MG tablet Take 1 tablet (15 mg total) by mouth 3 (three) times daily.   dextromethorphan-guaiFENesin (MUCINEX DM) 30-600 MG 12hr tablet Take 1 tablet by mouth 2 (two) times daily.   doxycycline (VIBRA-TABS) 100 MG tablet Take 1 tablet (100 mg total) by mouth 2 (two) times daily.   fluticasone (FLONASE) 50 MCG/ACT nasal spray SPRAY 2 SPRAYS INTO EACH NOSTRIL EVERY DAY   Fluticasone-Umeclidin-Vilant (TRELEGY ELLIPTA) 200-62.5-25 MCG/ACT AEPB Inhale 1 puff into the lungs daily.   glucose blood (CONTOUR NEXT TEST) test strip Check blood sugar once daily   insulin glargine (LANTUS SOLOSTAR) 100 UNIT/ML Solostar Pen Inject 26 Units into the skin daily.   insulin lispro (HUMALOG) 100 UNIT/ML injection Inject 0.03 mLs (3 Units total) into the skin 3 (three) times daily with meals.   Insulin Pen Needle (B-D UF III MINI PEN NEEDLES) 31G X 5 MM MISC USE WITH LANTUS   Insulin Pen Needle 32G X 4 MM MISC Use with insulin   Insulin Syringes, Disposable, U-100 1 ML MISC USE WITH LANTUS   JANUVIA 50 MG tablet TAKE 1 TABLET BY MOUTH EVERY DAY   meclizine (ANTIVERT) 12.5 MG tablet Take 1 tablet (12.5 mg total) by mouth 3 (three) times daily as needed for dizziness.   metFORMIN (GLUCOPHAGE) 1000 MG  tablet TAKE 1 TABLET (1,000 MG TOTAL) BY MOUTH TWICE A DAY WITH FOOD   Nintedanib (OFEV) 100 MG CAPS Take 1 capsule (100 mg total) by mouth 2 (two) times daily. **note dose change**   oxyCODONE-acetaminophen (PERCOCET/ROXICET) 5-325 MG tablet Take 1 tablet by mouth every 4 (four) hours as needed for pain- frequency change   pantoprazole (PROTONIX) 40 MG tablet Take 1 tablet (40 mg total) by mouth 2 (two) times daily before a meal.   predniSONE (DELTASONE) 10 MG tablet TAKE 1 TABLET (10 MG TOTAL) BY MOUTH DAILY WITH BREAKFAST.   predniSONE  (DELTASONE) 5 MG tablet 5 tab po day 1, 4 tab po day 2, 3 tab po day 4, 2 tab po day 5 and 1 tab po day 6.   pregabalin (LYRICA) 75 MG capsule TAKE 1 CAPSULE BY MOUTH TWICE A DAY (Patient taking differently: Take 75 mg by mouth 2 (two) times daily.)   tamsulosin (FLOMAX) 0.4 MG CAPS capsule Take 2 capsules (0.8 mg total) by mouth daily.   tiZANidine (ZANAFLEX) 4 MG tablet Take 1 and 1/2 tablets (6 mg total) by mouth every 4 hours.   venlafaxine XR (EFFEXOR-XR) 150 MG 24 hr capsule TAKE 1 CAPSULE BY MOUTH DAILY WITH BREAKFAST.   warfarin (COUMADIN) 5 MG tablet TAKE 1 TO 2 TABLETS DAILY OR AS PRESCRIBED BY COUMADIN CLINIC   zolpidem (AMBIEN) 10 MG tablet Take 0.5-1 tablets (5-10 mg total) by mouth at bedtime as needed for sleep.   No facility-administered encounter medications on file as of 01/13/2023.    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   Esophageal reflux 02/10/2015   Fatigue    HTN (hypertension)    Hyperglycemia    preDM    Hyperlipidemia    Insomnia    Low back pain radiating to both legs 01/16/2017   Measles as a child   Overweight(278.02)    Peripheral neuropathy 08/07/2019   Personal history of colonic polyps 10/27/2012   Follows with [facility]   Pneumonia    " walking"   Preventative health care    Testosterone deficiency 05/23/2012   Wears glasses    Past Surgical History:  Procedure Laterality Date   ATRIAL FIBRILLATION  ABLATION N/A 01/27/2022   Procedure: ATRIAL FIBRILLATION ABLATION;  Surgeon: Luis Lemming, MD;  Location: [facility];  Service: Cardiovascular;  Laterality: N/A;   BACK SURGERY  2012 and 1994   Dr Luis Brown, screws and cage in low back   BIOPSY  01/09/2020   Procedure: BIOPSY;  Surgeon: Kathi Der, MD;  Location: [facility];  Service: Gastroenterology;;   BRONCHIAL BIOPSY  11/17/2020   Procedure: BRONCHIAL BIOPSIES;  Surgeon: Tomma Lightning, MD;  Location: [facility];  Service: Endoscopy;;   BRONCHIAL BRUSHINGS  11/17/2020   Procedure: BRONCHIAL BRUSHINGS;  Surgeon: Tomma Lightning, MD;  [facility]: [facility];  Service: Endoscopy;;   BRONCHIAL WASHINGS  11/17/2020   Procedure: BRONCHIAL WASHINGS;  Surgeon: Tomma Lightning, MD;  [facility]: [facility];  Service: Endoscopy;;   COLONOSCOPY WITH PROPOFOL N/A 01/09/2020   Procedure: COLONOSCOPY WITH PROPOFOL;  Surgeon: Kathi Der, MD;  [facility];  Service: Gastroenterology;  Laterality: N/A;   ESOPHAGOGASTRODUODENOSCOPY (EGD) WITH PROPOFOL N/A 01/09/2020   Procedure: ESOPHAGOGASTRODUODENOSCOPY (EGD) WITH PROPOFOL;  Surgeon: Kathi Der, MD;  [facility];  Service: Gastroenterology;  Laterality: N/A;   EYE SURGERY Bilateral    2016   HARDWARE REVISION  03/12/2019   REVISION OF HARDWARE THORACIC TEN-THORACIC ELEVEN WITH APPLICATION OF ADDITIONAL RODS AND ROD SLEEVES THORACIC EIGHT-THORACIC TWELVE 12-LUMBAR ONE (N/A )   HEMOSTASIS CONTROL  11/17/2020   Procedure: HEMOSTASIS CONTROL;  Surgeon: Tomma Lightning, MD;  [facility];  Service: Endoscopy;;   IR US GUIDE BX ASP/DRAIN  11/13/2020   LAMINECTOMY  02/12/2019   DECOMPRESSIVE LAMINECTOMY THORACIC NINE-THORACIC TEN AND THORACIC TEN-THORACIC ELEVEN, EXTENSION OF THORACOLUMBAR FUSION FROM THORACIC TEN TO THORACIC FIVE, LOCAL BONE GRAFT, ALLOGRAFT AND VIVIGEN (N/A)   POLYPECTOMY  01/09/2020   Procedure: POLYPECTOMY;  Surgeon:  Kathi Der, MD;  [facility];  Service: Gastroenterology;;   REMOVAL OF RODS AND PEDICLE SCREWS T5 AND T6, EXPLORATION OF FUSION, BIOPSY TRANSPEDICULAR T5 AND T6 (N/A )  09/14/2020   SPINAL FUSION N/A 02/12/2019   Procedure: DECOMPRESSIVE LAMINECTOMY THORACIC NINE-THORACIC TEN AND THORACIC TEN-THORACIC ELEVEN, EXTENSION OF THORACOLUMBAR FUSION FROM THORACIC TEN TO THORACIC FIVE, LOCAL BONE GRAFT, ALLOGRAFT AND VIVIGEN;  Surgeon: Kerrin Champagne, MD;  Location: [facility];  Service: Orthopedics;  Laterality: N/A;  DECOMPRESSIVE LAMINECTOMY THORACIC NINE-THORACIC TEN AND THORACIC TEN-THORACIC ELEVEN, EXTENSION OF TH   SPINAL FUSION N/A 03/12/2019   Procedure: REVISION OF HARDWARE THORACIC TEN-THORACIC ELEVEN WITH APPLICATION OF ADDITIONAL RODS AND ROD SLEEVES THORACIC EIGHT-THORACIC TWELVE 12-LUMBAR ONE;  Surgeon: Kerrin Champagne, MD;  Location: [facility];  Service: Orthopedics;  Laterality: N/A;   TONSILLECTOMY     as child   torn rotator cuff  2010   right   VIDEO BRONCHOSCOPY N/A 11/17/2020   Procedure: VIDEO BRONCHOSCOPY WITH FLUORO;  Surgeon: Tomma Lightning, MD;  [facility];  Service: Endoscopy;  Laterality: N/A;   Family History  Problem Relation Age of Onset   Hypertension Mother    Diabetes Mother        type 2   Cancer Mother 86       breast in remission   Emphysema Father        smoker   COPD Father        smoker   Stroke Father 30       mini   Heart disease Father    Hypertension Sister    Hyperlipidemia Sister    Scoliosis Sister    Osteoporosis Sister    Hypertension Maternal Grandmother    Scoliosis Maternal Grandmother    Heart disease Maternal Grandfather    Heart disease Paternal Grandfather        smoker   Heart disease Daughter    Social History   Socioeconomic History   Marital status: Married    Spouse name: Not on file   Number of children: Not on file   Years of education: Not on file   Highest education level: 12th grade   Occupational History   Not on file  Tobacco Use   Smoking status: Former    Packs/day: 1.00    Years: 20.00    Additional pack  years: 0.00    Total pack years: 20.00    Types: Cigarettes    Quit date: 07/18/1989    Years since quitting: 33.5   Smokeless tobacco: Never   Tobacco comments:    Former smoker 03/16/22  Vaping Use   Vaping Use: Never used  Substance and Sexual Activity   Alcohol use: Yes    Alcohol/week: 2.0 standard drinks of alcohol    Types: 2 Standard drinks or equivalent per week    Comment: couple beers here and there 03/16/22   Drug use: No   Sexual activity: Yes    Comment: lives with wife, still working, no dietary restrictions, continues to exercise intermittently  Other Topics Concern   Not on file  Social History Narrative   Not on file   Social Determinants of Health   Financial Resource Strain: Low Risk  (01/13/2023)   Overall Financial Resource Strain (CARDIA)    Difficulty of Paying Living Expenses: Not hard at all  Food Insecurity: No Food Insecurity (01/13/2023)   Hunger Vital Sign    Worried About Running Out of Food in the Last Year: Never true    Ran Out of Food in the Last Year: Never true  Transportation Needs: No Transportation Needs (01/13/2023)   PRAPARE - Administrator, Civil Service (Medical): No    Lack of Transportation (Non-Medical): No  Physical Activity: Inactive (01/13/2023)   Exercise Vital Sign    Days of Exercise per Week: 0 days    Minutes of Exercise per Session: 0 min  Stress: Stress Concern Present (01/13/2023)   [employer] of Occupational Health - Occupational Stress Questionnaire    Feeling of Stress : To some extent  Social Connections: Socially Integrated (01/13/2023)   Social Connection and Isolation Panel [NHANES]    Frequency of Communication with Friends and Family: Three times a week    Frequency of Social Gatherings with Friends and Family: Patient declined    Attends Religious Services:  More than 4 times per year    Active Member of [employer] or Organizations: Yes    Attends Engineer, structural: More than 4 times per year    Marital Status: Married    Tobacco Counseling Counseling given: Not Answered Tobacco comments: Former smoker 03/16/22   Clinical Intake:  Pre-visit preparation completed: Yes  Pain : 0-10 Pain Score: 7  Pain Type: Chronic pain Pain Location: Back Pain Orientation: Upper Pain Descriptors / Indicators: Aching Pain Onset: More than a month ago Pain Frequency: Constant  BMI - recorded: 26.26 Nutritional Status: BMI 25 -29 Overweight Nutritional Risks: None Diabetes: Yes CBG done?: No Did pt. bring in CBG monitor from home?: No  How often do you need to have someone help you when you read instructions, pamphlets, or other written materials from your doctor or pharmacy?: 3 - Sometimes  Interpreter Needed?: No  Information entered by :: Henchy Mccauley,CMA   Activities of Daily Living    01/13/2023    2:11 PM  In your present state of health, do you have any difficulty performing the following activities:  Hearing? 1  Comment a little hard of hearing  Vision? 0  Difficulty concentrating or making decisions? 0  Walking or climbing stairs? 1  Dressing or bathing? 0  Doing errands, shopping? 0  Preparing Food and eating ? N  Using the Toilet? N  In the past six months, have you accidently leaked urine? N  Do you have problems with loss of  bowel control? N  Managing your Medications? N  Managing your Finances? N  Housekeeping or managing your Housekeeping? N    Patient Care Team: Bradd Canary, MD as PCP - General (Family Medicine) Thomasene Ripple, DO as PCP - Cardiology (Cardiology) Luis Lemming, MD as PCP - Electrophysiology (Cardiology) Marzella Schlein., MD (Ophthalmology) Kerrin Champagne, MD (Inactive) as Consulting Physician (Orthopedic Surgery)  Indicate any recent Medical Services you may have received from  other than [facility] providers in the past year (date may be approximate).     Assessment:   This is a routine wellness examination for [place].  Hearing/Vision screen No results found.  Dietary issues and exercise activities discussed:     Goals Addressed   None    Depression Screen    01/13/2023    2:27 PM 09/15/2022    1:12 PM 03/15/2022   10:08 AM 12/21/2021   11:07 AM 09/21/2021   11:18 AM 03/17/2021    3:23 PM 01/27/2021    2:24 PM  PHQ 2/9 Scores  PHQ - 2 Score 0 0 0 0 0 1 1  PHQ- 9 Score  0 6        Fall Risk    01/13/2023    2:11 PM 09/15/2022    1:11 PM 05/05/2022    1:45 PM 03/15/2022   10:08 AM 12/21/2021   11:06 AM  Fall Risk   Falls in the past year? 1 0 0 0 0  Number falls in past yr: 1 0  0 0  Injury with Fall? 0 0  0 0  Risk for fall due to : History of fall(s);Medication side effect  No Fall Risks    Risk for fall due to: Comment blood pressure medication dropped him too low, 2nd fall due to foot getting caught in water hose      Follow up Falls evaluation completed;Falls prevention discussed Falls evaluation completed Falls evaluation completed      MEDICARE RISK AT HOME:   TIMED UP AND GO:  Was the test performed?  No    Cognitive Function:    08/23/2016   10:22 AM  MMSE - Mini Mental State Exam  Orientation to time 5  Orientation to Place 5  Registration 3  Attention/ Calculation 5  Recall 2  Language- name 2 objects 2  Language- repeat 1  Language- follow 3 step command 3  Language- read & follow direction 1  Write a sentence 1  Copy design 1  Total score 29        01/13/2023    2:30 PM  6CIT Screen  What Year? 0 points  What month? 0 points  What time? 0 points  Count back from 20 0 points  Months in reverse 0 points  Repeat phrase 0 points  Total Score 0 points    Immunizations Immunization History  Administered Date(s) Administered   COVID-19, mRNA, vaccine(Comirnaty)12 years and older 05/23/2022   Fluad Quad(high Dose 65+)  04/05/2019, 06/29/2020, 05/18/2021, 04/22/2022   Influenza Split 04/23/2012   Influenza Whole 02/16/2011   Influenza, High Dose Seasonal PF 05/06/2013, 03/25/2016, 05/05/2017, 06/07/2018   PFIZER(Purple Top)SARS-COV-2 Vaccination 08/23/2019, 09/13/2019, 06/07/2020   Pfizer Covid-19 Vaccine Bivalent Booster 58yrs & up 04/29/2021   Pneumococcal Conjugate-13 03/25/2016   Pneumococcal Polysaccharide-23 01/20/2009, 10/19/2011   Respiratory Syncytial Virus Vaccine,Recomb Aduvanted(Arexvy) 04/09/2022   Td 07/09/2003, 07/18/2008   Td (Adult) 07/09/2003   Tdap 07/20/2011   Zoster, Live 04/27/2016    TDAP status:  Due, Education has been provided regarding the importance of this vaccine. Advised may receive this vaccine at local pharmacy or Health Dept. Aware to provide a copy of the vaccination record if obtained from local pharmacy or Health Dept. Verbalized acceptance and understanding.  Flu Vaccine status: Up to date  Pneumococcal vaccine status: Up to date  Covid-19 vaccine status: Information provided on how to obtain vaccines.   Qualifies for Shingles Vaccine? Yes   Zostavax completed Yes   Shingrix Completed?: No.    Education has been provided regarding the importance of this vaccine. Patient has been advised to call insurance company to determine out of pocket expense if they have not yet received this vaccine. Advised may also receive vaccine at local pharmacy or Health Dept. Verbalized acceptance and understanding.  Screening Tests Health Maintenance  Topic Date Due   Zoster Vaccines- Shingrix (1 of 2) Never done   FOOT EXAM  08/23/2017   DTaP/Tdap/Td (5 - Td or Tdap) 07/19/2021   Medicare Annual Wellness (AWV)  01/27/2022   OPHTHALMOLOGY EXAM  12/01/2022   COVID-19 Vaccine (6 - 2023-24 season) 03/17/2023 (Originally 07/18/2022)   INFLUENZA VACCINE  02/16/2023   HEMOGLOBIN A1C  06/23/2023   Diabetic kidney evaluation - Urine ACR  08/24/2023   Diabetic kidney evaluation - eGFR  measurement  01/10/2024   Pneumonia Vaccine 71+ Years old  Completed   Hepatitis C Screening  Completed   HPV VACCINES  Aged Out   Colonoscopy  Discontinued    Health Maintenance  Health Maintenance Due  Topic Date Due   Zoster Vaccines- Shingrix (1 of 2) Never done   FOOT EXAM  08/23/2017   DTaP/Tdap/Td (5 - Td or Tdap) 07/19/2021   Medicare Annual Wellness (AWV)  01/27/2022   OPHTHALMOLOGY EXAM  12/01/2022    Colorectal cancer screening: No longer required.   Lung Cancer Screening: (Low Dose CT Chest recommended if Age 107-80 years, 20 pack-year currently smoking OR have quit w/in 15years.) does not qualify.   Additional Screening:  Hepatitis C Screening: does qualify; Completed 03/25/16  Vision Screening: Recommended annual ophthalmology exams for early detection of glaucoma and other disorders of the eye. Is the patient up to date with their annual eye exam?  Yes  Who is the provider or what is the name of the office in which the patient attends annual eye exams? [facility] If pt is not established with a provider, would they like to be referred to a provider to establish care? No .   Dental Screening: Recommended annual dental exams for proper oral hygiene  Diabetic Foot Exam: Diabetic Foot Exam: Overdue, Pt has been advised about the importance in completing this exam. Pt is scheduled for diabetic foot exam on N/a.  Community Resource Referral / Chronic Care Management: CRR required this visit?  No   CCM required this visit?  No     Plan:     I have personally reviewed and noted the following in the patient's chart:   Medical and social history Use of alcohol, tobacco or illicit drugs  Current medications and supplements including opioid prescriptions. Patient is currently taking opioid prescriptions. Information provided to patient regarding non-opioid alternatives. Patient advised to discuss non-opioid treatment plan with their provider. Functional ability  and status Nutritional status Physical activity Advanced directives List of other physicians Hospitalizations, surgeries, and ER visits in previous 12 months Vitals Screenings to include cognitive, depression, and falls Referrals and appointments  In addition, I have reviewed and  discussed with patient certain preventive protocols, quality metrics, and best practice recommendations. A written personalized care plan for preventive services as well as general preventive health recommendations were provided to patient.     Donne Anon, CMA   01/13/2023   After Visit Summary: (MyChart) Due to this being a telephonic visit, the after visit summary with patients personalized plan was offered to patient via MyChart   Nurse Notes: None

## 2023-01-13 NOTE — Telephone Encounter (Signed)
done

## 2023-01-13 NOTE — Telephone Encounter (Signed)
Called spoke with pt regarding back pain and  Just had to explain to him from what he stated area of the  Pain in his back. Pt was advised we had to put in referral where his pain was for the surgeon and he stated understand.

## 2023-01-13 NOTE — Patient Instructions (Signed)
Luis Brown , Thank you for taking time to come for your Medicare Wellness Visit. I appreciate your ongoing commitment to your health goals. Please review the following plan we discussed and let me know if I can assist you in the future.     This is a list of the screening recommended for you and due dates:  Health Maintenance  Topic Date Due   Zoster (Shingles) Vaccine (1 of 2) Never done   Complete foot exam   08/23/2017   DTaP/Tdap/Td vaccine (5 - Td or Tdap) 07/19/2021   Eye exam for diabetics  12/01/2022   COVID-19 Vaccine (6 - 2023-24 season) 03/17/2023*   Flu Shot  02/16/2023   Hemoglobin A1C  06/23/2023   Yearly kidney health urinalysis for diabetes  08/24/2023   Yearly kidney function blood test for diabetes  01/10/2024   Medicare Annual Wellness Visit  01/13/2024   Pneumonia Vaccine  Completed   Hepatitis C Screening  Completed   HPV Vaccine  Aged Out   Colon Cancer Screening  Discontinued  *Topic was postponed. The date shown is not the original due date.     Next appointment: Follow up in one year for your annual wellness visit.   Preventive Care 67 Years and Older, Male Preventive care refers to lifestyle choices and visits with your health care provider that can promote health and wellness. What does preventive care include? A yearly physical exam. This is also called an annual well check. Dental exams once or twice a year. Routine eye exams. Ask your health care provider how often you should have your eyes checked. Personal lifestyle choices, including: Daily care of your teeth and gums. Regular physical activity. Eating a healthy diet. Avoiding tobacco and drug use. Limiting alcohol use. Practicing safe sex. Taking low doses of aspirin every day. Taking vitamin and mineral supplements as recommended by your health care provider. What happens during an annual well check? The services and screenings done by your health care provider during your annual well  check will depend on your age, overall health, lifestyle risk factors, and family history of disease. Counseling  Your health care provider may ask you questions about your: Alcohol use. Tobacco use. Drug use. Emotional well-being. Home and relationship well-being. Sexual activity. Eating habits. History of falls. Memory and ability to understand (cognition). Work and work Astronomer. Screening  You may have the following tests or measurements: Height, weight, and BMI. Blood pressure. Lipid and cholesterol levels. These may be checked every 5 years, or more frequently if you are over 38 years old. Skin check. Lung cancer screening. You may have this screening every year starting at age 61 if you have a 30-pack-year history of smoking and currently smoke or have quit within the past 15 years. Fecal occult blood test (FOBT) of the stool. You may have this test every year starting at age 79. Flexible sigmoidoscopy or colonoscopy. You may have a sigmoidoscopy every 5 years or a colonoscopy every 10 years starting at age 89. Prostate cancer screening. Recommendations will vary depending on your family history and other risks. Hepatitis C blood test. Hepatitis B blood test. Sexually transmitted disease (STD) testing. Diabetes screening. This is done by checking your blood sugar (glucose) after you have not eaten for a while (fasting). You may have this done every 1-3 years. Abdominal aortic aneurysm (AAA) screening. You may need this if you are a current or former smoker. Osteoporosis. You may be screened starting at age 49 if you  are at high risk. Talk with your health care provider about your test results, treatment options, and if necessary, the need for more tests. Vaccines  Your health care provider may recommend certain vaccines, such as: Influenza vaccine. This is recommended every year. Tetanus, diphtheria, and acellular pertussis (Tdap, Td) vaccine. You may need a Td booster  every 10 years. Zoster vaccine. You may need this after age 104. Pneumococcal 13-valent conjugate (PCV13) vaccine. One dose is recommended after age 66. Pneumococcal polysaccharide (PPSV23) vaccine. One dose is recommended after age 62. Talk to your health care provider about which screenings and vaccines you need and how often you need them. This information is not intended to replace advice given to you by your health care provider. Make sure you discuss any questions you have with your health care provider. Document Released: 07/31/2015 Document Revised: 03/23/2016 Document Reviewed: 05/05/2015 Elsevier Interactive Patient Education  2017 [employer].  Fall Prevention in the Home Falls can cause injuries. They can happen to people of all ages. There are many things you can do to make your home safe and to help prevent falls. What can I do on the outside of my home? Regularly fix the edges of walkways and driveways and fix any cracks. Remove anything that might make you trip as you walk through a door, such as a raised step or threshold. Trim any bushes or trees on the path to your home. Use bright outdoor lighting. Clear any walking paths of anything that might make someone trip, such as rocks or tools. Regularly check to see if handrails are loose or broken. Make sure that both sides of any steps have handrails. Any raised decks and porches should have guardrails on the edges. Have any leaves, snow, or ice cleared regularly. Use sand or salt on walking paths during winter. Clean up any spills in your garage right away. This includes oil or grease spills. What can I do in the bathroom? Use night lights. Install grab bars by the toilet and in the tub and shower. Do not use towel bars as grab bars. Use non-skid mats or decals in the tub or shower. If you need to sit down in the shower, use a plastic, non-slip stool. Keep the floor dry. Clean up any water that spills on the floor as soon  as it happens. Remove soap buildup in the tub or shower regularly. Attach bath mats securely with double-sided non-slip rug tape. Do not have throw rugs and other things on the floor that can make you trip. What can I do in the bedroom? Use night lights. Make sure that you have a light by your bed that is easy to reach. Do not use any sheets or blankets that are too big for your bed. They should not hang down onto the floor. Have a firm chair that has side arms. You can use this for support while you get dressed. Do not have throw rugs and other things on the floor that can make you trip. What can I do in the kitchen? Clean up any spills right away. Avoid walking on wet floors. Keep items that you use a lot in easy-to-reach places. If you need to reach something above you, use a strong step stool that has a grab bar. Keep electrical cords out of the way. Do not use floor polish or wax that makes floors slippery. If you must use wax, use non-skid floor wax. Do not have throw rugs and other things on the  floor that can make you trip. What can I do with my stairs? Do not leave any items on the stairs. Make sure that there are handrails on both sides of the stairs and use them. Fix handrails that are broken or loose. Make sure that handrails are as long as the stairways. Check any carpeting to make sure that it is firmly attached to the stairs. Fix any carpet that is loose or worn. Avoid having throw rugs at the top or bottom of the stairs. If you do have throw rugs, attach them to the floor with carpet tape. Make sure that you have a light switch at the top of the stairs and the bottom of the stairs. If you do not have them, ask someone to add them for you. What else can I do to help prevent falls? Wear shoes that: Do not have high heels. Have rubber bottoms. Are comfortable and fit you well. Are closed at the toe. Do not wear sandals. If you use a stepladder: Make sure that it is fully  opened. Do not climb a closed stepladder. Make sure that both sides of the stepladder are locked into place. Ask someone to hold it for you, if possible. Clearly mark and make sure that you can see: Any grab bars or handrails. First and last steps. Where the edge of each step is. Use tools that help you move around (mobility aids) if they are needed. These include: Canes. Walkers. Scooters. Crutches. Turn on the lights when you go into a dark area. Replace any light bulbs as soon as they burn out. Set up your furniture so you have a clear path. Avoid moving your furniture around. If any of your floors are uneven, fix them. If there are any pets around you, be aware of where they are. Review your medicines with your doctor. Some medicines can make you feel dizzy. This can increase your chance of falling. Ask your doctor what other things that you can do to help prevent falls. This information is not intended to replace advice given to you by your health care provider. Make sure you discuss any questions you have with your health care provider. Document Released: 04/30/2009 Document Revised: 12/10/2015 Document Reviewed: 08/08/2014 Elsevier Interactive Patient Education  2017 [employer].

## 2023-01-18 ENCOUNTER — Encounter: Payer: Medicare Other | Admitting: Physical Therapy

## 2023-01-19 ENCOUNTER — Encounter: Payer: Self-pay | Admitting: Family Medicine

## 2023-01-20 ENCOUNTER — Ambulatory Visit: Payer: Medicare Other | Attending: Cardiology | Admitting: *Deleted

## 2023-01-20 ENCOUNTER — Other Ambulatory Visit: Payer: Self-pay | Admitting: Family Medicine

## 2023-01-20 DIAGNOSIS — I4891 Unspecified atrial fibrillation: Secondary | ICD-10-CM

## 2023-01-20 DIAGNOSIS — I480 Paroxysmal atrial fibrillation: Secondary | ICD-10-CM | POA: Diagnosis not present

## 2023-01-20 DIAGNOSIS — Z7901 Long term (current) use of anticoagulants: Secondary | ICD-10-CM

## 2023-01-20 LAB — POCT INR: INR: 2.1 (ref 2.0–3.0)

## 2023-01-20 MED ORDER — ZOLPIDEM TARTRATE 10 MG PO TABS: 5.0000 mg | ORAL_TABLET | Freq: Every evening | ORAL | 3 refills | Status: DC | PRN

## 2023-01-20 NOTE — Patient Instructions (Signed)
Description   Continue taking warfarin 1 tablet daily except 1.5 tablets on Tuesdays and Saturdays.  Stay consistent with greens/protein drinks each week. Recheck INR in 5 weeks.  [facility]# [phone]

## 2023-01-22 ENCOUNTER — Encounter: Payer: Self-pay | Admitting: Family Medicine

## 2023-01-22 DIAGNOSIS — J9601 Acute respiratory failure with hypoxia: Secondary | ICD-10-CM | POA: Diagnosis not present

## 2023-01-22 DIAGNOSIS — R0602 Shortness of breath: Secondary | ICD-10-CM | POA: Diagnosis not present

## 2023-01-23 ENCOUNTER — Encounter: Payer: Self-pay | Admitting: Family Medicine

## 2023-01-23 ENCOUNTER — Other Ambulatory Visit: Payer: Self-pay | Admitting: Family Medicine

## 2023-01-23 ENCOUNTER — Encounter: Payer: Medicare Other | Admitting: Physical Therapy

## 2023-01-23 IMAGING — CT CT ANGIO CHEST
2 of 6 series · 18 of 36 positions shown · IV contrast (agent unspecified)
Comparison: CT chest angiogram, 07/15/2021

CLINICAL DATA: Known PE on Lovenox, new oxygen requirement, concern
for clot progression, additional history of pill aspiration, concern
for retained fragment

EXAM:
CT ANGIOGRAPHY CHEST WITH CONTRAST
TECHNIQUE: Multidetector CT imaging of the chest was performed using the
standard protocol during bolus administration of intravenous
contrast. Multiplanar CT image reconstructions and MIPs were
obtained to evaluate the vascular anatomy.

[Series 5: thins · axial · 0.77mm/px · z∈[+1282,+1518]mm · 17 of 265 slices shown]
[im 15/265  lung]
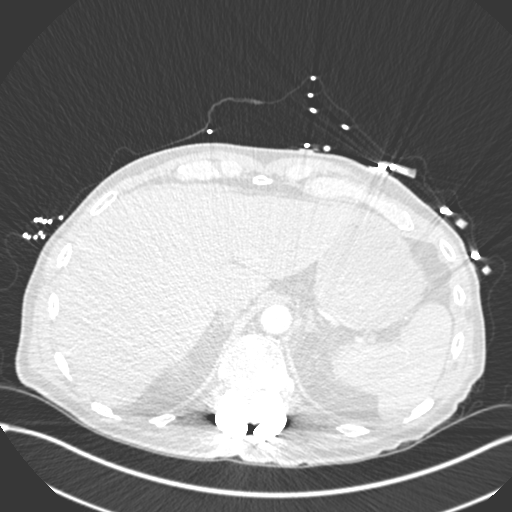
[im 30/265  mediastinal]
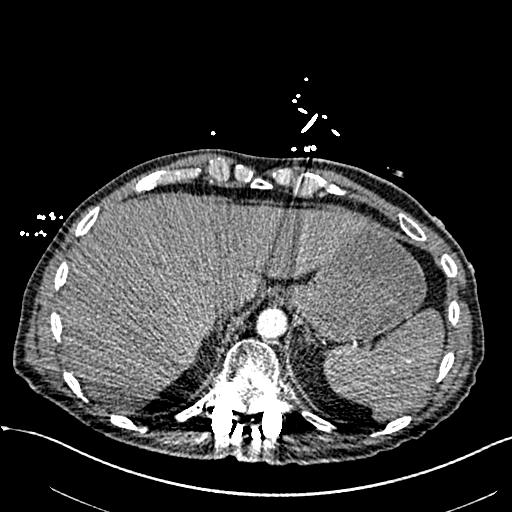
[im 45/265  lung]
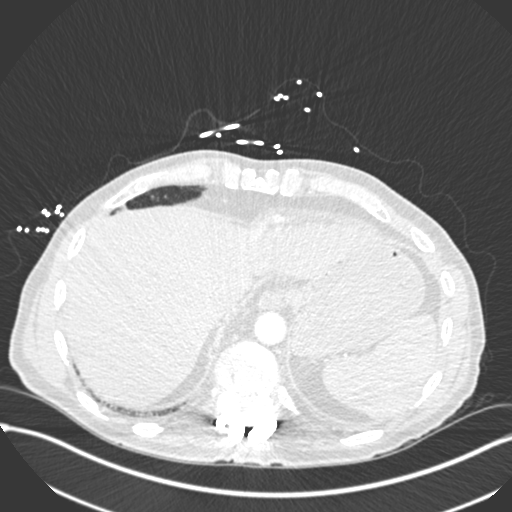
[im 59/265  mediastinal]
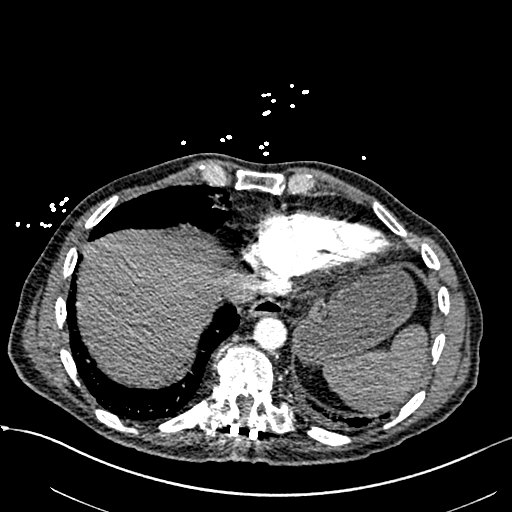
[im 74/265  lung]
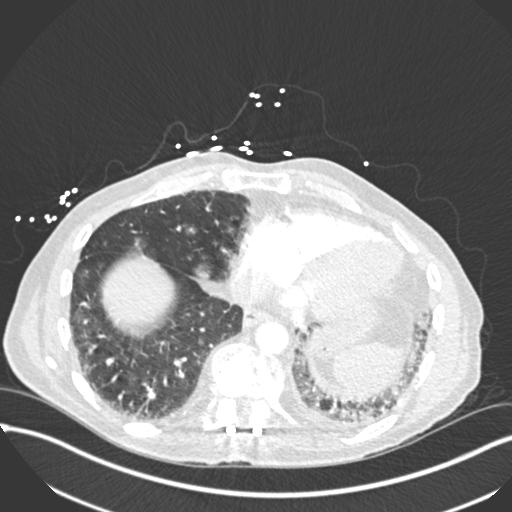
[im 89/265  mediastinal]
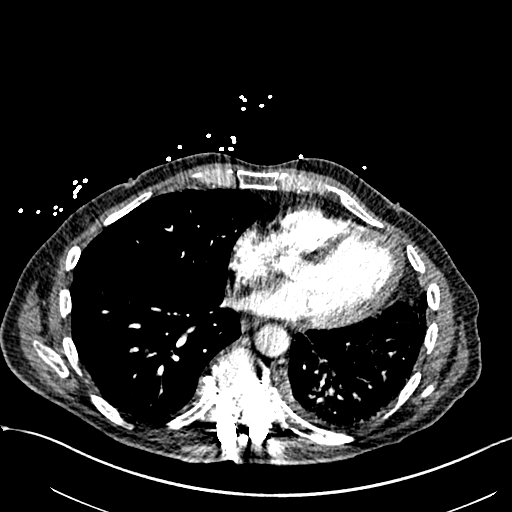
[im 103/265  lung]
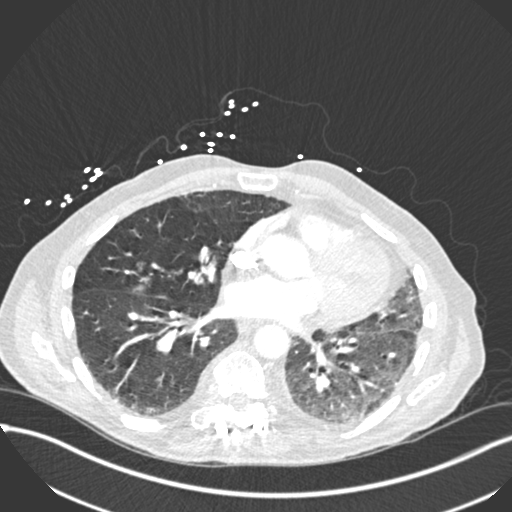
[im 118/265  mediastinal]
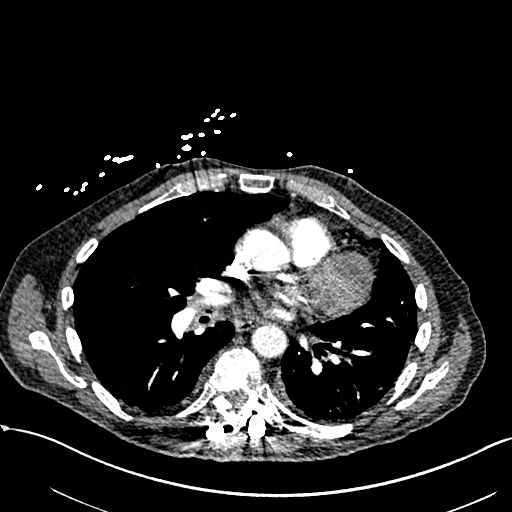
[im 133/265  lung]
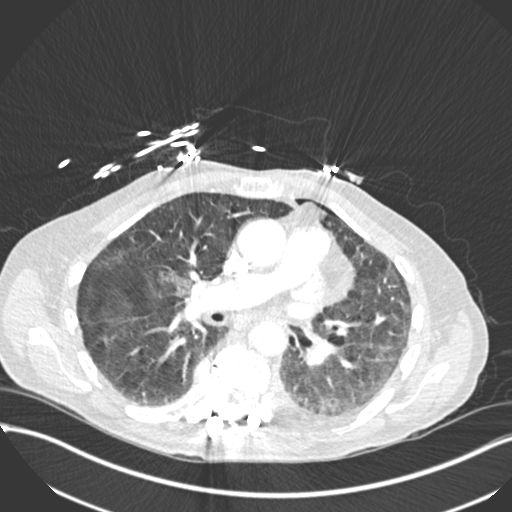
[im 147/265  mediastinal]
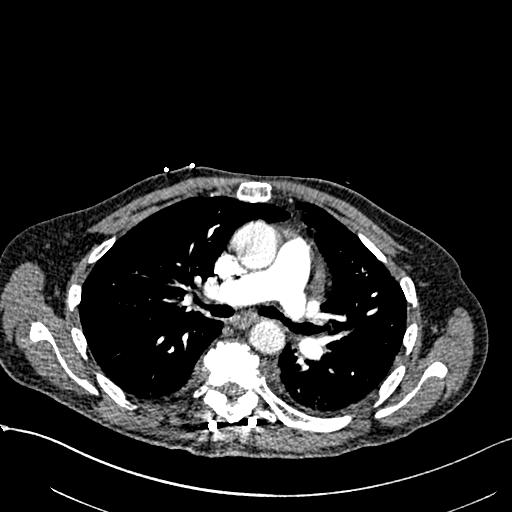
[im 162/265  lung]
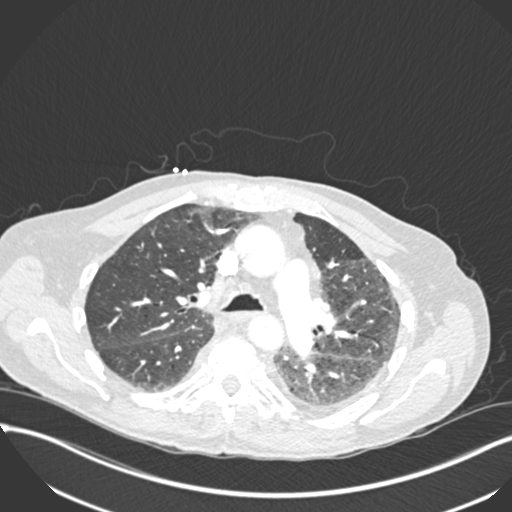
[im 177/265  mediastinal]
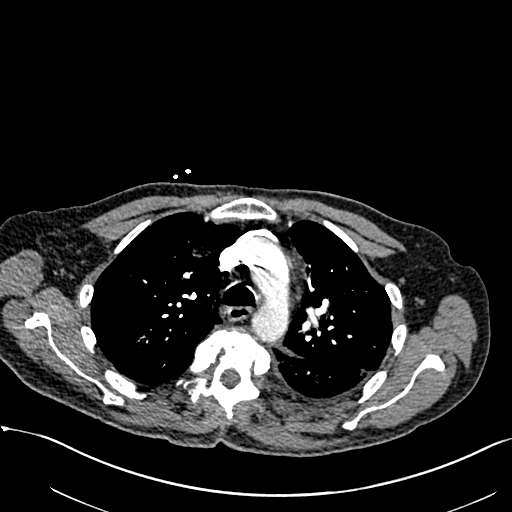
[im 191/265  lung]
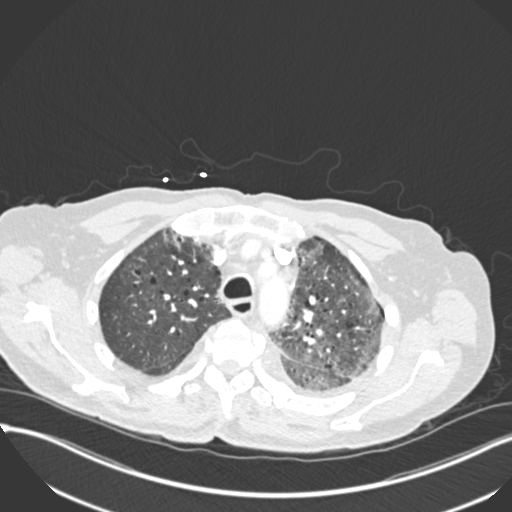
[im 206/265  mediastinal]
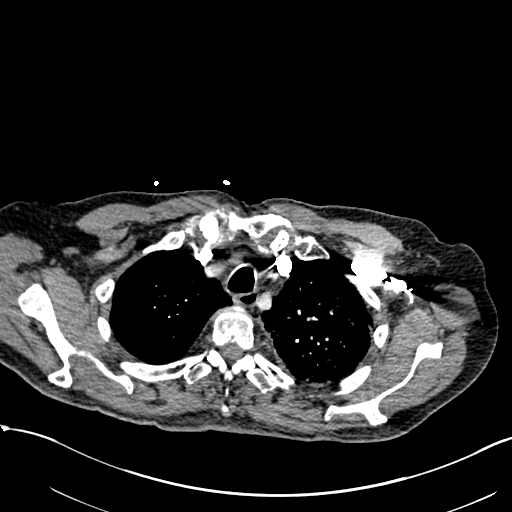
[im 221/265  lung]
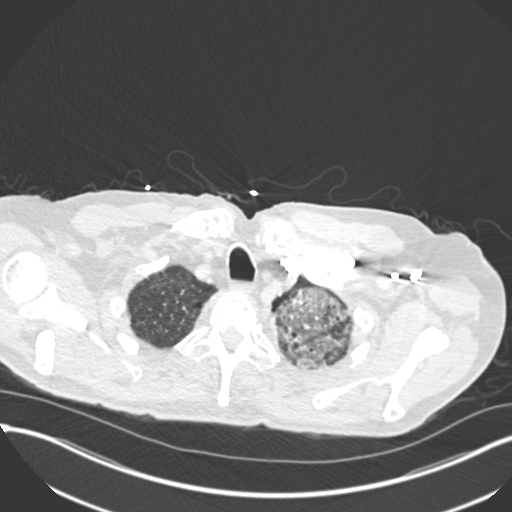
[im 235/265  mediastinal]
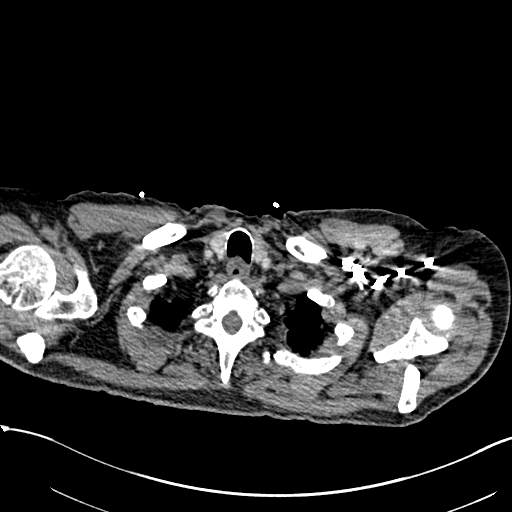
[im 250/265  lung]
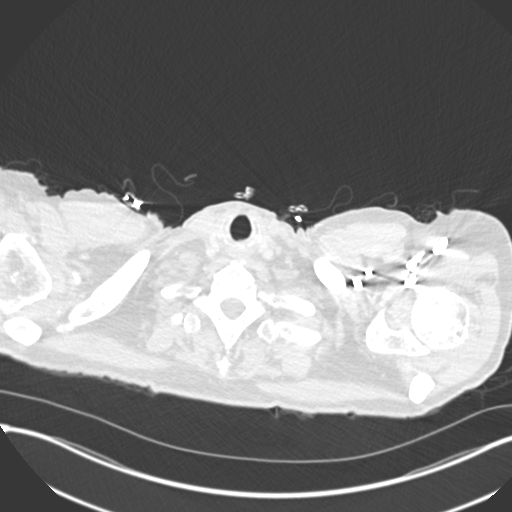

[Series 7: coronal mpr · coronal · 0.57mm/px · 1 of 125 slices shown]
[im 63/125  mediastinal]
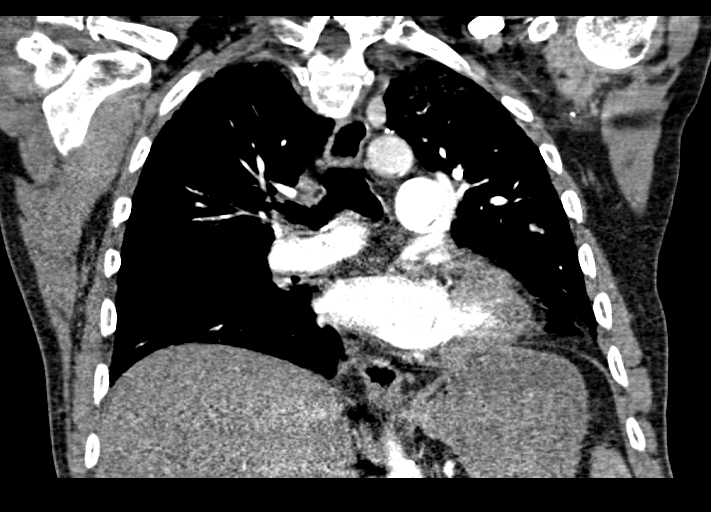

[18 of 36 positions shown; findings below may reference images not displayed]

RADIATION DOSE REDUCTION: This exam was performed according to the
departmental dose-optimization program which includes automated
exposure control, adjustment of the mA and/or kV according to
patient size and/or use of iterative reconstruction technique.

CONTRAST:  75mL OMNIPAQUE IOHEXOL 350 MG/ML SOLN
FINDINGS: Cardiovascular: Satisfactory opacification of the pulmonary arteries
to the segmental level. No evidence of pulmonary embolism. Normal
heart size. Left coronary artery calcifications. No pericardial
effusion. Scattered aortic atherosclerosis.

Mediastinum/Nodes: No enlarged mediastinal, hilar, or axillary lymph
nodes. Thyroid gland, trachea, and esophagus demonstrate no
significant findings.

Lungs/Pleura: Extensive bilateral heterogeneous and ground-glass
airspace disease throughout the lungs is in general improved,
particularly in the left lung, however there are new, scattered
ground-glass airspace opacities throughout the right lung (series 6,
image 101). Mild underlying centrilobular and paraseptal emphysema.
No pleural effusion or pneumothorax.

Upper Abdomen: No acute abnormality.

Musculoskeletal: No chest wall abnormality. No acute osseous
findings.

Review of the MIP images confirms the above findings.
IMPRESSION: 1. Negative examination for pulmonary embolism. Previously seen
segmental to subsegmental embolus in the right lung is resolved.
2. Extensive bilateral heterogeneous and ground-glass airspace
disease throughout the lungs is in general improved, particularly in
the left lung, however there are new, scattered ground-glass
airspace opacities throughout the right lung. Findings are
consistent with ongoing infection or inflammation.
3. Emphysema.
4. Coronary artery disease.

Aortic Atherosclerosis (4690R-C29.9) and Emphysema (4690R-9R8.S).

## 2023-01-23 MED ORDER — ZOLPIDEM TARTRATE 10 MG PO TABS: 5.0000 mg | ORAL_TABLET | Freq: Every evening | ORAL | 3 refills | Status: DC | PRN

## 2023-01-24 ENCOUNTER — Other Ambulatory Visit: Payer: Self-pay | Admitting: Family Medicine

## 2023-01-24 DIAGNOSIS — M2550 Pain in unspecified joint: Secondary | ICD-10-CM

## 2023-01-24 DIAGNOSIS — R700 Elevated erythrocyte sedimentation rate: Secondary | ICD-10-CM

## 2023-01-24 MED ORDER — PREDNISONE 20 MG PO TABS: ORAL_TABLET | ORAL | 0 refills | Status: AC

## 2023-01-26 ENCOUNTER — Encounter: Payer: Medicare Other | Admitting: Physical Therapy

## 2023-01-30 ENCOUNTER — Encounter: Payer: Medicare Other | Admitting: Physical Therapy

## 2023-02-01 ENCOUNTER — Other Ambulatory Visit: Payer: Self-pay

## 2023-02-01 ENCOUNTER — Encounter: Payer: Self-pay | Admitting: Family Medicine

## 2023-02-01 DIAGNOSIS — J84112 Idiopathic pulmonary fibrosis: Secondary | ICD-10-CM

## 2023-02-01 DIAGNOSIS — R0602 Shortness of breath: Secondary | ICD-10-CM

## 2023-02-01 MED ORDER — "INSULIN SYRINGE-NEEDLE U-100 30G X 1/2"" 1 ML MISC": 1 refills | Status: DC

## 2023-02-02 ENCOUNTER — Encounter: Payer: Self-pay | Admitting: Internal Medicine

## 2023-02-02 ENCOUNTER — Telehealth: Payer: Self-pay | Admitting: Internal Medicine

## 2023-02-02 ENCOUNTER — Ambulatory Visit (INDEPENDENT_AMBULATORY_CARE_PROVIDER_SITE_OTHER): Payer: Medicare Other | Admitting: Internal Medicine

## 2023-02-02 ENCOUNTER — Encounter: Payer: Self-pay | Admitting: Family Medicine

## 2023-02-02 ENCOUNTER — Ambulatory Visit: Payer: Medicare Other | Admitting: Internal Medicine

## 2023-02-02 VITALS — BP 94/84 | HR 81 | Temp 98.0°F | Ht 70.0 in | Wt 184.8 lb

## 2023-02-02 DIAGNOSIS — J84112 Idiopathic pulmonary fibrosis: Secondary | ICD-10-CM

## 2023-02-02 DIAGNOSIS — R0602 Shortness of breath: Secondary | ICD-10-CM

## 2023-02-02 DIAGNOSIS — Z86711 Personal history of pulmonary embolism: Secondary | ICD-10-CM | POA: Diagnosis not present

## 2023-02-02 DIAGNOSIS — R5383 Other fatigue: Secondary | ICD-10-CM

## 2023-02-02 DIAGNOSIS — Z5181 Encounter for therapeutic drug level monitoring: Secondary | ICD-10-CM

## 2023-02-02 LAB — PULMONARY FUNCTION TEST
DL/VA % pred: 83 %
DL/VA: 3.27 ml/min/mmHg/L
DLCO cor % pred: 59 %
DLCO cor: 14.58 ml/min/mmHg
DLCO unc % pred: 55 %
DLCO unc: 13.59 ml/min/mmHg
FEF 25-75 Pre: 2.34 L/sec
FEF2575-%Pred-Pre: 115 %
FEV1-%Pred-Pre: 85 %
FEV1-Pre: 2.49 L
FEV1FVC-%Pred-Pre: 110 %
FEV6-%Pred-Pre: 82 %
FEV6-Pre: 3.12 L
FEV6FVC-%Pred-Pre: 107 %
FVC-%Pred-Pre: 76 %
FVC-Pre: 3.13 L
Pre FEV1/FVC ratio: 79 %
Pre FEV6/FVC Ratio: 100 %

## 2023-02-02 LAB — HEPATIC FUNCTION PANEL
ALT: 11 U/L (ref 0–53)
AST: 14 U/L (ref 0–37)
Albumin: 4.2 g/dL (ref 3.5–5.2)
Alkaline Phosphatase: 87 U/L (ref 39–117)
Bilirubin, Direct: 0.1 mg/dL (ref 0.0–0.3)
Total Bilirubin: 0.7 mg/dL (ref 0.2–1.2)
Total Protein: 6.3 g/dL (ref 6.0–8.3)

## 2023-02-02 LAB — D-DIMER, QUANTITATIVE: D-Dimer, Quant: 0.64 mcg/mL FEU — ABNORMAL HIGH (ref <0.50)

## 2023-02-02 LAB — PROTIME-INR
INR: 2.7 ratio — ABNORMAL HIGH (ref 0.8–1.0)
Prothrombin Time: 27.4 s — ABNORMAL HIGH (ref 9.6–13.1)

## 2023-02-02 NOTE — Patient Instructions (Signed)
Spiro/DLCO performed today. 

## 2023-02-02 NOTE — Telephone Encounter (Signed)
  LFT nromal   D-dimer still up" he should continue coumadin . Should not ever be on testosterone anymore. In dec 2024 we can look at  moving back to xarelto   Recent Labs  Lab 02/02/23 1055  AST 14  ALT 11  ALKPHOS 87  BILITOT 0.7  PROT 6.3  ALBUMIN 4.2  INR 2.7*    Latest Reference Range & Units 01/29/07 00:30 01/22/19 10:24 08/04/21 10:46 02/02/23 10:55  D-Dimer, Quant <0.50 mcg/mL FEU <0.22        AT THE INHOUSE ESTABLISHED CUTOFF VALUE OF 0.48 ug/mL FEU, THIS ASSAY HAS BEEN DOCUMENTED IN THE LITERATURE TO HAVE 1.64 (H) 0.40 0.64 (H)  (H): Data is abnormally high

## 2023-02-02 NOTE — Progress Notes (Signed)
Spiro/DLCO performed today. 

## 2023-02-02 NOTE — Progress Notes (Signed)
Referring provider: Marisue Brooklyn  HPI: 77 year old male, former smoker (20-pack-year history).  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   Previous [facility] pulmonary encounters:  12/29/20- Dr. Marchelle Gearing Chief Complaint  Patient presents with   Follow-up    PFT  performed 5/24.  Pt states since last visit, he has been doing okay. States he has lost some weight that he has noticed since last visit.    Follow-up drug-induced pneumonitis daptomycin from early May 2022  HPI ONEY FOLZ 77 y.o. -returns for follow-up.  I personally saw him in the hospital on 11/20/2020.  At that point in time he had been started on Solu-Medrol on 11/18/2020.  When I saw him he was on day 3 of Solu-Medrol and he was needing 3 L nasal cannula oxygen at rest.  At that point in time even with 3 L he was desaturating to 86% just talking.  Put him on prednisone 60 mg/day.  After that he see nurse practitioner x2 at least 1 of which was a telephone visit..  As part of his follow-up on 12/11/2020 he had a CT scan of the chest [3 days after pulmonary function test] and on my personal visualization it showed new onset pneumothorax but significant improvement in infiltrates and inflammation.  He had another follow-up chest x-ray 12/17/2020 in which no pneumothorax reported [similar on 12/15/2020] and pulmonary infiltrates appear improved.  He is now here with his wife  He tells me he is now on 10 mg/day prednisone he just darted that today.  He is 1 week left and after that the prednisone taper ends.  Because of the pneumothorax is physical therapy has been on hold but 4 days ago he was evaluated and apparently his pulse ox was 100% but this was on oxygen.  He continues to use his  oxygen although he is not sure if he is desaturating anymore.  He does feel overall improved compared to 6 weeks ago but still has significant amount of fatigue.  He has significant insomnia which he believes is due to prednisone.  Today's last night was the first night he could sleep when he dropped his prednisone down to 10 mg/day.  He believes his fatigue is multifactorial related to the disease, multiple medications, lack of oxygen, insomnia etc.  02/09/2021 Patient presents today for 1 month follow-up. Since his last visit in June, he has established with cardiology and was dx with afib. He is currently on Xarelto and Digoxin. His wife states that he has had more energy recently. He is scheduled for testosterone injection tomorrow. He gets winded with stairs and heavy exertion. He reports intermittent wheezing. He needs refill of albuterol rescue inhaler. He has weaned himself of oxygen and O2 equipment has been picked up. He has been sleeping better at night. Anxiety has improved, he stopped xanax. Due for follow-up CXR today. 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  02/23/2021 Patient presents today for 2 week follow-up. During his last visit he was given trial Spiriva Respimat d/t dyspnea /wheezing symptoms and moderate obstruction seen on his PFTs. He has been using sample of Spiriva Respimat 2.91mcg as prescribed, he occasionally misses a dose for 1-2 days. He states that his wheezing has stopped but he has not noticed a significant difference in  dyspnea symptoms. His ILD scale appears improved from two weeks go. He has having some muscle spasms in his back that started before being placed on LABA.   04/26/2021 -   Chief Complaint  Patient presents with   Follow-up    F/u, trelegy medication concerns    KELLAR WESTBERG 77 y.o. -returns for follow-up.  Since I last saw him he seen nurse practitioner 2 times.  Symptom scores for  dyspnea continue to improve.  He was also walked for oxygen desaturation February 04, 2019 did not desaturate.  He says he continues to get better.  He is off prednisone.  Nurse practitioner noticed obstructive physiology on the pulmonary function test.  Put him on inhaler.  Currently is on Trelegy.  He says it helps him tremendously.  He feels it opens up his lungs.  He declined his flu shot today because he will have it after the COVID-vaccine in October or November 2022.   06/24/2021- Interim hx  Patient presents today for acute OV/cough. Patient reports increased shortness of breath since 06/13/21 with associated fatigue, cough and chest tightness. Cough is productive with clear mucus. Symptoms started after Thanksgiving. His wife had the flu at the same time. His cough initially improved while taking mucinex, he stopped since he was starting to feel better. Denies f/c/s, purulent mucus, N/V/D. CAT score 17.   Inpatient consult note 07/17/2022 by Dr. Marchelle Gearing at [facility] This is a 77 year old male with poorly controlled diabetes, hemoglobin A1c 7.4, chronic opioid use for back pain, BPH on Flomax, anxiety and depression, paroxysmal A. fib on metoprolol digoxin and Xarelto .  He had long hx of back infection and was on daptomycin.  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  Was treated with steroids.  Course complicated by right upper extremity DVT secondary to PICC line for which she was on Xarelto.  He is requiring oxygen at that time.  By the summer 2022 he was off oxygen and able to ambulate even without oxygen.  Course was also complicated later in May 2022 with spontaneous pneumothorax.  By the summer 2022 he was able to come off prednisone.  He also had diagnosed of atrial fibrillation for which he was on digoxin and Xarelto was continued [although originally started for DVT].  Last seen in clinic October  2022 at which time he was off oxygen and off steroids.  He was on Trelegy inhaler for pulmonary function test that back in May 2022 showed obstructive physiology.  He says he was feeling better.  He is only minimally symptomatic.  Plan at that time was to get a repeat high-resolution CT chest done in the spring 2023.   On Thanksgiving 2022 his wife had the flu and he was exposed to this and after that he started having increasing cough and fatigue.  Up until this point he was able to do walking and fishing.  He saw  pulm nurse practitioner 06/24/2021 in the pulmonary clinic.  Was given 8-day prednisone taper for presumed diagnosis of COPD exacerbation.  No antibiotic.  He did see infectious diseases at the same time and for his back infections it was noted, "10 days of linezolid + 8 weeks of tedizolid for his spinal infection and has now been started on dalbavancin infusions every 2 weeks last month as a suppressive regim" (none of which appear to be associated with drug-induced ILD]  He then called into the pulmonary office on 07/13/2021 felt he was desaturating mildly and coughing up green mucus and fevers..  2 COVID home test were negative.  He had a fever of 100.7.  He wanted some prednisone.  We gave him some Omnicef as well and a 5-day prednisone taper but also advised that to report to the ER.  He then presented on 07/15/2021 for admission with acute hypoxic respiratory failure 58% and a history of 1 week of cough and shortness of breath getting worse.  Appears he might not have taken Omnicef.  He had lactic acidosis 2.3 at admission, white count of nearly 15,000 with lymphopenia.  CT angiogram showed acute PE segmental and subsegmental right-sided [despite being on Xarelto] started on IV heparin started on Solu-Medrol and antibiotics . CT also showed L >>> R (opposted from may 2022) GGO. and .  His Doppler ultrasound lower extremities negative for DVT.   On 07/16/2021 he was stable on 4 L oxygen.   His flu, multiplex PCR and MRSA PCR were negative.  Procalcitonin 0.21.  Echo is normal with normal RV.  Then on 12/3 1/22 seen progressively more hypoxemic now requiring 8 L of oxygen.  Briefly required even more but looking comfortable.  When assisted to go to the bathroom even on 8 L he desaturated to 70s he has been afebrile since admission. Trop improved from 200  prior day -> 70, lactic 1.9 with normal BNP. CXR with ?> Worsening LEft sided air space disease and ccm consulted      OV 08/12/2021  Subjective:  [name], male , DOB: [birth year] , age 32 y.o. , MRN: [id number] , ADDRESS: [address] [place] [address] PCP Esperanza Richters, PA-C Patient Care Team: Saguier, Kateri Mc as PCP - General (Internal Medicine) Thomasene Ripple, DO as PCP - Cardiology (Cardiology) Marzella Schlein., MD (Ophthalmology) Kerrin Champagne, MD as Consulting Physician (Orthopedic Surgery)  This Provider for this visit: Treatment Team:  Attending Provider: Kalman Shan, MD    08/12/2021 -   Chief Complaint  Patient presents with   Hospitalization Follow-up    Breathing has improved some but not back to baseline yet. He has some chest congestion and occ cough with clear to yellow sputum.    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  He also has paroxysmal A. fib, low testosterone, chronic hyponatremia, chronic opioid intake.,  Chronic Lyrica  HPI DEEJAY KOPPELMAN 77 y.o. -returns for office follow-up.  After last saw him in the office I did actually see him New Year's Eve in the hospital with acute on chronic hypoxemic respiratory failure at this admission he had a new diagnosis of pulmonary embolism that was deemed Xarelto failure [he was on Xarelto for DVT in his upper extremity from spring 2022.  Today I learned that he is actually taking testosterone for some years.  When he had the DVT in the spring 2022 after the discharge he was  on testosterone.  He says he took testosterone right up to the admission.  He is now getting subcutaneous Lovenox injections.  He wants to go to Coumadin because has been deemed as Xarelto failure.  Also prior to that admission he did have some infectious symptoms with colored sputum.  At the time of that admission he denies any aspiration.  He was discharged home on oxygen.  He was discharged on a prednisone taper.  Also discharged on oxygen [of note  he was on 10 L oxygen during the hospital stay]  At home he continued on oxygen was doing the prednisone taper but got readmitted 08/07/2021 for 1 day and discharged the following day 08/08/2021 following aspiration of a pill.  He says he has never had any aspiration.  He also and his wife also states that speech eval for swallow in the hospital was normal.  He denies any other choking episodes for pills or for food.  He was treated with antibiotics.  This time at discharge he was only on 2 L nasal cannula.  He was given another prednisone taper.  He tells me that on 14 August 2021 he will go to 30 mg/day for 10 days and then 20 mg daily for 10 days and then 10 mg daily for 10 days and stop   Walking desaturation test today on room air: At room air at rest his pulse ox was 95% for at least 20 minutes.  Then we decided to walk him 185 feet x 3 laps on room air:.  He only walked 1 lap and his pulse ox went down to 86% on room air and stopped.  His heart rate went up to 122/min.  His most recent chest x-ray was 5 days ago on 08/07/2021 at the time of the hospitalization second time for aspirating pill: This is improved compared to the admission 1.   His current symptom score is listed below.    OV 09/28/2021  Subjective:  [name], male , DOB: [birth year] , age 29 y.o. , MRN: [id number] , ADDRESS: [address] [place] [address] PCP Esperanza Richters, PA-C Patient Care Team: Saguier, Kateri Mc as PCP - General (Internal  Medicine) Thomasene Ripple, DO as PCP - Cardiology (Cardiology) Marzella Schlein., MD (Ophthalmology) Kerrin Champagne, MD as Consulting Physician (Orthopedic Surgery)  This Provider for this visit: Treatment Team:  Attending Provider: Kalman Shan, MD    09/28/2021 -   Chief Complaint  Patient presents with   Follow-up    Pt states he still becomes SOB with exertion and also has an occasional cough. States the cough happens throughout the day and will get some phlegm up that is clear to yellow tinge.   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 And PE with "ILD Falare" Dec 2022/Jan 2023   He also has paroxysmal A. fib, low testosterone, chronic hyponatremia, chronic opioid intake.,  Chronic Lyrica  HPI PAYSON EVRARD 77 y.o. -returns for follow-up.  Since his last visit he has continued on prednisone 10 mg/day.  He continues to have shortness of breath.  In fact this is unchanged.  He does tell me though starting spring season he has had allergic rhinitis.  Wife states he has seasonal allergies for many years.  He is also having worsening cough.  He took a antihistamine and is somewhat better.  He continues to schedule Stiolto.  He has upcoming atrial fibrillation ablation end of March 2023.  Also in mid February 2023 had another CT angiogram chest that ruled out pulmonary embolism [done while on anticoagulation].  I personally visualized the CT chest and compared to late December 2023/early January 2023 the findings are better.  There might be some residual scarring and potential emphysema.  We walked him today and his walking desaturation test held up.  He did not desaturate.  There is a huge improvement.  Of note he was supposed have pulmonary function test today but  this was not performed.  He does not know why.  Apologized for any missteps.    For his pulmonary embolism: It appears that he might not be taking his testosterone shots  anymore.  Regarding seasonal allergies: He has not had work-up for this.  But currently it is active.     HPI: 77 year old male, former smoker (20-pack-year history).  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   Previous [facility] pulmonary encounters:  12/29/20- Dr. Marchelle Gearing Chief Complaint  Patient presents with   Follow-up    PFT  performed 5/24.  Pt states since last visit, he has been doing okay. States he has lost some weight that he has noticed since last visit.    Follow-up drug-induced pneumonitis daptomycin from early May 2022  HPI AVI KERSCHNER 77 y.o. -returns for follow-up.  I personally saw him in the hospital on 11/20/2020.  At that point in time he had been started on Solu-Medrol on 11/18/2020.  When I saw him he was on day 3 of Solu-Medrol and he was needing 3 L nasal cannula oxygen at rest.  At that point in time even with 3 L he was desaturating to 86% just talking.  Put him on prednisone 60 mg/day.  After that he see nurse practitioner x2 at least 1 of which was a telephone visit..  As part of his follow-up on 12/11/2020 he had a CT scan of the chest [3 days after pulmonary function test] and on my personal visualization it showed new onset pneumothorax but significant improvement in infiltrates and inflammation.  He had another follow-up chest x-ray 12/17/2020 in which no pneumothorax reported [similar on 12/15/2020] and pulmonary infiltrates appear improved.  He is now here with his wife  He tells me he is now on 10 mg/day prednisone he just darted that today.  He is 1 week left and after that the prednisone taper ends.  Because of the pneumothorax is physical therapy has been on hold but 4 days ago he was evaluated and apparently his pulse ox was  100% but this was on oxygen.  He continues to use his oxygen although he is not sure if he is desaturating anymore.  He does feel overall improved compared to 6 weeks ago but still has significant amount of fatigue.  He has significant insomnia which he believes is due to prednisone.  Today's last night was the first night he could sleep when he dropped his prednisone down to 10 mg/day.  He believes his fatigue is multifactorial related to the disease, multiple medications, lack of oxygen, insomnia etc.  02/09/2021 Patient presents today for 1 month follow-up. Since his last visit in June, he has established with cardiology and was dx with afib. He is currently on Xarelto and Digoxin. His wife states that he has had more energy recently. He is scheduled for testosterone injection tomorrow. He gets winded with stairs and heavy exertion. He reports intermittent wheezing. He needs refill of albuterol rescue inhaler. He has weaned himself of oxygen and O2 equipment has been picked up. He has been sleeping better at night. Anxiety has improved, he stopped xanax. Due for follow-up CXR today. 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  02/23/2021 Patient presents today for 2 week follow-up. During his last visit he was given trial Spiriva Respimat  d/t dyspnea /wheezing symptoms and moderate obstruction seen on his PFTs. He has been using sample of Spiriva Respimat 2.72mcg as prescribed, he occasionally misses a dose for 1-2 days. He states that his wheezing has stopped but he has not noticed a significant difference in dyspnea symptoms. His ILD scale appears improved from two weeks go. He has having some muscle spasms in his back that started before being placed on LABA.   04/26/2021 -   Chief Complaint  Patient presents with   Follow-up    F/u, trelegy medication concerns    SAVAN RUTA 77 y.o. -returns for follow-up.  Since I last saw him he seen  nurse practitioner 2 times.  Symptom scores for dyspnea continue to improve.  He was also walked for oxygen desaturation February 04, 2019 did not desaturate.  He says he continues to get better.  He is off prednisone.  Nurse practitioner noticed obstructive physiology on the pulmonary function test.  Put him on inhaler.  Currently is on Trelegy.  He says it helps him tremendously.  He feels it opens up his lungs.  He declined his flu shot today because he will have it after the COVID-vaccine in October or November 2022.  06/24/2021 Patient presents today for acute OV/cough. Patient reports increased shortness of breath since 06/13/21 with associated fatigue, cough and chest tightness. Cough is productive with clear mucus. Symptoms started after Thanksgiving. His wife had the flu at the same time. His cough initially improved while taking mucinex, he stopped since he was starting to feel better. Denies f/c/s, purulent mucus, N/V/D. CAT score 17.    09/28/2021 -   Chief Complaint  Patient presents with   Follow-up    Pt states he still becomes SOB with exertion and also has an occasional cough. States the cough happens throughout the day and will get some phlegm up that is clear to yellow tinge.   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 And PE with "ILD Falare" Dec 2022/Jan 2023   He also has paroxysmal A. fib, low testosterone, chronic hyponatremia, chronic opioid intake.,  Chronic Lyrica  HPI EMERSEN CARROLL 77 y.o. -returns for follow-up.  Since his last visit he has continued on prednisone 10 mg/day.  He continues to have shortness of breath.  In fact this is unchanged.  He does tell me though starting spring season he has had allergic rhinitis.  Wife states he has seasonal allergies for many years.  He is also having worsening cough.  He took a antihistamine and is somewhat better.  He continues to schedule Stiolto.  He has upcoming atrial fibrillation  ablation end of March 2023.  Also in mid February 2023 had another CT angiogram chest that ruled out pulmonary embolism [done while on anticoagulation].  I personally visualized the CT chest and compared to late December 2023/early January 2023 the findings are better.  There might be some residual scarring and potential emphysema.  We walked him today and his walking desaturation test held up.  He did not desaturate.  There is a huge improvement.  Of note he was supposed have pulmonary function test today but this was not performed.  He does not know why.  Apologized for any missteps.    For his pulmonary embolism: It appears that he might not be taking his testosterone shots anymore.  Regarding seasonal allergies: He has not had work-up for this.  But currently it is active.   12/30/2021 Patient  presents today for a 3 month follow-up with PFTs. Patient needs simple walk test, symptom scale and FENO today.  Patient is maintained on Stiolto. Has not required albuterol. He has chronic shortness of breath and fatigue symptoms which are significantly better since last visit. No cough. He remains on 10mg  prednisone daily. No oxygen reqiuirments during daytime. Using 1L oxygen at bedtime.  HRCT on 12/28/2021 showed regression of subpleural and basilar predominant fibrotic interstitial lung disease compared to May 2022.  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 PFTs showed improvement in FEV1 but decreased diffusion capacity. Eosinophil absolute was 100 and March.  Respiratory allergy panel was negative. FENO today was normal. Discussed starting antifibrtoic to slow progression of pulmonary fibrosis.   02/10/2022 Patient presents today for 1 month follow-up/ new antifibrotic start. 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 He started Esbriet 1.5 weeks ago d/t gout flare. He is tolerating medication well, currently on 2 tablets three times a day. No GI symptoms. Dyspnea appears some better overall. No acute respiratory  symptoms. No active cough, mucinex-dm helps when needed. He took 3/4 mild walk the other day without issues. Continues on Stiolto, prn albuterol and 5mg  prednisone daily. Overnight oximetry test on 01/24/22 showed that patient spent 5 min with SPO2 less than 88%. SPO2 low 85% and baseline 93%. Patient started on 1L supplemental oxygen at bedtime.   03/24/2022- Interim hx  Patient presents today 2 month follow-up/ ILD. 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 Patient had spirometry with DLCO testing today. He was unable to tolerate antifibrotic medication Esbriet d/t nausea, headache and muscle aches. He was never able to get to full dose. He had labs checked with PCP yesterday, LFTs were normal. Sodium was low and needs recheck in 1 week   He reports increased sob symptoms last couple of weeks. He has associated wheezing and morning sputum production. He takes mucinex first thing in the morning which helps. He in on 5mg  prednisone daily. Spirometry showed decline in FEV1 and diffusion capacity compared to June.   Pulmonary testing 03/24/2022 Spirometry>> FVC 2.88 (69%), FEV1 2.23 (75%), ratio 78, DLCOcor 14.05 (56%)     hx OV 05/19/2022  Subjective:  [name], male , DOB: 21-Jul-1945 , age 21 y.o. , MRN: [id number] , ADDRESS: [address] [place] [address] PCP Bradd Canary, MD Patient Care Team: Bradd Canary, MD as PCP - General (Family Medicine) Thomasene Ripple, DO as PCP - Cardiology (Cardiology) Regan Lemming, MD as PCP - Electrophysiology (Cardiology) Marzella Schlein., MD (Ophthalmology) Kerrin Champagne, MD as Consulting Physician (Orthopedic Surgery)  This Provider for this visit: Treatment Team:  Attending Provider: Kalman Shan, MD   05/19/2022 -   Chief Complaint  Patient presents with   Follow-up    Pt states that he still has problems with SOB which is about the same since last visit.     HPI LIJAH BOURQUE 77 y.o. -returns for follow-up.   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  It is now looking more more that he has IPF but if not IPF progressive phenotype.  The UIP pattern is a bad prognostic sign.  He is feeling stable but his pulmonary function test today shows decline.  We tried him on pirfenidone but he did not tolerated.  He stated given body aches.  Now for the last  1 week he is on full dose nintedanib.  He is having some mild fatigue but no diarrhea.  Overall he feels he is tolerating the medicine well.  Last liver function test end of September 2023 before he went on the Internet.  He will have liver function test today.  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  He is also still on testosterone but he is on anticoagulation.  I did warn him about the risks of pulmonary embolism with testosterone he is already had pulm embolism.  He has had his RSV and flu vaccine and he plans to have his COVID mRNA booster.    06/20/2022- interim hx  Patient presents today for follow-up after starting anti-fibrotic medication.  He feels his breathing  is some worse over the last 4-5 months. When walking at his own pace he does all right.  Increased dyspnea symptoms with quick exertion.  Overall he does okay shopping but takes this time.  Not coughing much, prn mucinex helps loosen congestion. Mainly clears his throat d/t PND. He was started on OFEV in early November, currently taking 150mg  twice daily. He is tolerating medication except for fatigue. Fatigue is worse on some days.  Not sleeping well at night requiring naps.  He has had previous sleep study which was negative for OSA. Maintained on Trelegy and wears oxygen at bedtime. He is taking Prednisone 5mg  daily. Liver function within normal limits in November. He is on BuSpar for anxiety which is helping. He takes oxycodone 6 times daily  for chronic back pain, following with pain management. He is scheduled for CT chest on 07/13/22 and PFTs in January to re-assess lung function. He has an overview with Dr. Marchelle Gearing on 08/02/21.       OV 08/02/2022  Subjective:  [name], male , DOB: 1945-08-02 , age 38 y.o. , MRN: [id number] , ADDRESS: [address] [place] [address] PCP Bradd Canary, MD Patient Care Team: Bradd Canary, MD as PCP - General (Family Medicine) Thomasene Ripple, DO as PCP - Cardiology (Cardiology) Regan Lemming, MD as PCP - Electrophysiology (Cardiology) Marzella Schlein., MD (Ophthalmology) Kerrin Champagne, MD (Inactive) as Consulting Physician (Orthopedic Surgery)  This Provider for this visit: Treatment Team:  Attending Provider: Kalman Shan, MD    08/02/2022 -   Chief Complaint  Patient presents with   Follow-up    PFT     HPI Luis Brown 77 y.o. -returns for follow-up.  Continue on prednisone 5 mg/day.  He is also on nintedanib.  He and his wife report nintedanib is $3100 per month and is quite expensive.  They are not able to get a lower co-pay.  Last it was $600 per month co-pay.  He feels fatigue because of nintedanib.  It is mild to moderate.  He is willing to pulm rehabilitation.  He does have a long back scar and has had previous back surgery but he feels he can handle pulmonary rehabilitation.  He is clear that his fatigue is because of the nintedanib but he does not want to stop it.  We reviewed his PFTs and CT scan and shows recent stability.  Therefore he is pleased about it and does not want to stop the nintedanib.  He is willing to put up with the fatigue.  He was wondering if his shortness of breath is getting worse because he thought because of the  shortness of breath and ongoing cough and fatigue that he might be worse.  But his symptom score shows stability.  [See below]  Of note his wife is also here with him.  She also is worried about  his high co-pay.  She is also reporting independently that his fatigue is related to nintedanib.  She also believes that he can do pulm rehabilitation and in fact was pleased that offered it.   OV 11/10/2022  Subjective:  Patient ID: BREEZE ANGELL, male , DOB: 1945-12-07 , age 57 y.o. , MRN: [id number] , ADDRESS: [address] [place] [address] PCP Bradd Canary, MD Patient Care Team: Bradd Canary, MD as PCP - General (Family Medicine) Thomasene Ripple, DO as PCP - Cardiology (Cardiology) Regan Lemming, MD as PCP - Electrophysiology (Cardiology) Marzella Schlein., MD (Ophthalmology) Kerrin Champagne, MD (Inactive) as Consulting Physician (Orthopedic Surgery)  This Provider for this visit: Treatment Team:  Attending Provider: Kalman Shan, MD    11/10/2022 -   Chief Complaint  Patient presents with   Follow-up    Review PFT today.  Stopped Ofev x 3 weeks due to increased fatigue      HPI ELICEO GLADU 77 y.o. -returns for follow-up.  Presents with his wife.  His dyspnea itself is stable.  Pulmonary function test is improved/stable.  He stopped taking nintedanib 3 weeks ago because of severe fatigue ECOG 4.  Was lying in bed all the time.  Now after stopping the Internet his fatigue is improved and is back to baseline.  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  We discussed low-dose nintedanib about taking 100 mg once daily for a week and then escalating to twice daily.  He is willing to do this.  We took a shared decision making to commit to this approach.  He will have liver function test today.   At the last visit we discussed pulmonary rehabilitation but he does not have an appointment there.  Will make a rereferral today.    OV 02/02/2023  Subjective:  [name], male , DOB: May 31, 1946 , age 36 y.o. 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 fib, low testosterone, chronic hyponatremia, chronic opioid intake.,  Chronic Lyrica   - on coumadin   -ABNormal abnormal liver enzymes June 2024 after stopping nintedanib  -Right upper quadrant ultrasound with gallstones; emergency room visit 01/07/2023   Anxiety and depression on buspirone and oxycodone/acetaminophen  Tick bite June 2024 at [facility] spotted fever and also Lyme antibodies are negative Arthralgia of the right hand June 2024  - : Repeat rheumatoid factor negative.  Improvement with prednisone -Primary care is referred to  rheumatology. 02/02/2023 -   Chief Complaint  Patient presents with   Follow-up    F/u after PFT.  Breathing about the same.     HPI CHARELS STAMBAUGH 77 y.o.   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  Presents with his wife.  Wife is independent historian.  From pulmonary standpoint he is reporting stability.  Pulmonary function test actually stable.  Since his last visit he has stopped nintedanib.  This because at the last visit he complained of severe fatigue with nintedanib we gave a holiday and rechallenge with low-dose but the fatigue came back so he stopped it.  He is feeling much better.  The fatigue itself is better.  At the time he is not on any antifibrotic treatment or specific treatment for this.  Other issues - Therapies for IPF: Currently no other therapies available.  We discussed about clinical trials as a care option.  He was interested at this in a phone call.  We went over several aspects of clinical trials [see documented below].  He is interested.  At this point in time we resolved that we will prescreen him and look at different options.  And then based on this consider study and given the choice.  He and his wife are agreeable with this plan.  -Spring 2022 DVT and pulmonary embolism December 2022: He developed this in the setting of testosterone but testosterone is a risk factor was not recognized till a year later in December 2023.  At the time he got a pulm embolism in December 2022 it was felt it was Xarelto failure because he was on Xarelto for his atrial fibrillation.  He has since been on Coumadin with INR monitoring.  However he has been off his testosterone for at least 6 months now.  Will check a D-dimer.  Perhaps if he gets to 2-year mark which is end of this calendar year then we can consider going back to Xarelto.  -After he stopped nintedanib in June 2024 he ended up in the ER.  This was on January 07, 2023 based on chart review.  Gallstones were been diagnosed.  He  did have abnormal LFTs.  Primary care is monitoring the situation  -He was also in the ER December 24, 2022 and is followed up with primary care.  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  But is also in the ER complaint of joint pain and fatigue.  There is a history of tick bite on this ER visit.  Lyme and [facility] spotted fever antibodies are negative according to chart review.  Repeat rheumatoid factor was negative.  Has appointment upcoming with rheumatology later in December 2024.  He is on a prednisone taper.  Currently on 20 mg.  But he will come off this and go back  to his baseline 5 mg  -Chronic prednisone use: 5 mg/day.  I think this was started because of the concern for drug-induced ILD.  At some point we have to stop this.  Currently is on a short course prednisone burst.  We will can work on tapering it at the time of next visit if he is stable.  -Clinical trial concepts discussed    1. Scientific Purpose  Clinical research is designed to produce generalizable knowledge and to answer questions about the safety and efficacy of intervention(s) under study in order to determine whether or not they may be useful for the care of future patients.  2. Study Procedures  Participation in a trial may involve procedures or tests, in addition to the intervention(s) under study, that are intended only or primarily to generate scientific knowledge and that are otherwise not necessary for patient care.   3. Uncertainty  For intervention(s) under study in clinical research, there often is less knowledge and more uncertainty about the risks and benefits to a population of trial participants than there is when a doctor offers a patient standard interventions.   4. Adherence to Protocol  Administration of the intervention(s) under study is typically based on a strict protocol with defined dose, scheduling, and use or avoidance of concurrent medications, compared to  administration of standard interventions.  5. Clinician as Investigator  Clinicians who are in health care settings provide treatment; in a clinical trial setting, they are also investigating safety and efficacy of an intervention. In otherwise your doctor or nurse practitioner can be wearing 2 hats - one as care giver another as Oceanographer  6. Patient as Engineer, agricultural Subject  Patients participating in research trials are research subjects or volunteers. In other words participating in research is 100% voluntary and at one's own free weill. The decision to participate or not participate will NOT affect patient care and the doctor-patient relationship in any way  7. Financial Conflict of Interest Disclosure  One or more of the investigators of  the research trial might have an investment interest in [employer], [facility] the clinical trials practice through which the study is being conducted. Study sponsor pays the practice for the conduct of the study.     SYMPTOM SCALE - ILD 08/12/2021 09/28/2021  12/30/2021  02/10/2022  03/24/2022  05/19/2022  06/20/2022  08/02/2022  11/10/2022   Current weight   177lb  182 lbs 187# ofev x 1 week Ofev 150 bid Ofev 150 bid 173#. OFf ofev x 2 weeks due to fagite  O2 use 2L [place] but at rest he was 95%  RA RA  RA  ra RA    Shortness of Breath 0 -> 5 scale with 5 being worst (score 6 If unable to do)          At rest 1 1 0 0 0 1 2 1 1   Simple tasks - showers, clothes change, eating, shaving 3 2 0 0 0 1 2 1 1   Household (dishes, doing bed, laundry) 3 2 1  0 3 1 2  1.5 2  Shopping xno 3 1 0 3 2 2  1.5 3  Walking level at own pace 2 1.5 0 0 0 2 1 1 2   Walking up Stairs 3 2.5 2 1 4 3 2  1.5 2  Total (30-36) Dyspnea Score 12 12 4 1 10 10 11  7/5 12  How bad is your cough? Not often 3.5 Not often 0 1 0 2 0 1  How bad is your fatigue A lot 2 2 2.5 1 2 3  frequent 2  How bad is nausea 0 0 0 0 0 0 0 0 0  How bad is vomiting?  0 0 0 0 0 0 0 0 0  How bad is  diarrhea? 0 0 0 0 0 0 0 0 0  How bad is anxiety? some 1 0 0 0 3 3.5 - 4 On Buspar 1 3  How bad is depression A little 0 0.5 0 0 3 1 1 1   Any chronic pain - if so where and how bad x  Back  Back  Back  Back and neuopathy Back pain - oxycodone 5mg  / following with pain management  Controleld back pain on med x   Simple office walk 185 feet x  3 laps goal with forehead probe 08/12/2021  09/28/2021  12/30/2021  06/20/2022  11/10/2022   O2 used ra ra RA RA   Number laps completed 1  3 3  laps    Comments about pace steady  Steady Steady    Resting Pulse Ox/HR 95% and 89/min 99% and HR 90 97%; HR 97  100%; HR 77   Final Pulse Ox/HR 86% and 122/min 94% and HR 114 98%; HR 120 100%; 76%   Desaturated </= 88% no no No No   Desaturated <= 3% points yes Yes, 5 ponts No No   Got Tachycardic >/= 90/min yes yes Yes No   Symptoms at end of test dyspnea Mild does None None   Miscellaneous comments x improve x x         Latest Ref Rng & Units 02/01/2023   10:39 AM 11/10/2022   11:18 AM 08/02/2022   12:08 PM 03/24/2022    9:14 AM 12/30/2021   10:56 AM 12/08/2020   12:01 PM  PFT Results  FVC-Pre L 3.13  P 3.23  3.12  2.88  3.27  2.97   FVC-Predicted Pre % 76  P 82  75  69  79  71   FVC-Post L      2.96   FVC-Predicted Post %      71   Pre FEV1/FVC % % 79  P 76  78  78  76  66   Post FEV1/FCV % %      70   FEV1-Pre L 2.49  P 2.46  2.43  2.23  2.49  1.97   FEV1-Predicted Pre % 85  P 88  82  75  84  65   FEV1-Post L      2.07   DLCO uncorrected ml/min/mmHg 13.59  P 15.50  14.29  12.95  15.89  18.01   DLCO UNC% % 55  P 65  57  52  64  72   DLCO corrected ml/min/mmHg 14.58  P 16.46  14.29  14.05  17.38  20.52   DLCO COR %Predicted % 59  P 69  57  56  70  82   DLVA Predicted % 83  P 101  87  84  91  120   TLC L      5.04   TLC % Predicted %      71   RV % Predicted %      95     P Preliminary result      LAB RESULTS last 96 hours No results found.  LAB RESULTS last 90 days Recent Results (from  the past 2160 hour(s))  Pulmonary  function test     Status: None   Collection Time: 11/10/22 11:18 AM  Result Value Ref Range   FVC-Pre 3.23 L   FVC-%Pred-Pre 82 %   FEV1-Pre 2.46 L   FEV1-%Pred-Pre 88 %   FEV6-Pre 3.22 L   FEV6-%Pred-Pre 88 %   Pre FEV1/FVC ratio 76 %   FEV1FVC-%Pred-Pre 106 %   Pre FEV6/FVC Ratio 100 %   FEV6FVC-%Pred-Pre 106 %   FEF 25-75 Pre 2.01 L/sec   FEF2575-%Pred-Pre 104 %   DLCO unc 15.50 ml/min/mmHg   DLCO unc % pred 65 %   DLCO cor 16.46 ml/min/mmHg   DLCO cor % pred 69 %   DL/VA 5.40 ml/min/mmHg/L   DL/VA % pred 981 %  Hepatic function panel     Status: None   Collection Time: 11/10/22  1:48 PM  Result Value Ref Range   Total Bilirubin 0.6 0.2 - 1.2 mg/dL   Bilirubin, Direct 0.1 0.0 - 0.3 mg/dL   Alkaline Phosphatase 87 39 - 117 U/L   AST 20 0 - 37 U/L   ALT 15 0 - 53 U/L   Total Protein 6.7 6.0 - 8.3 g/dL   Albumin 4.1 3.5 - 5.2 g/dL  POCT INR     Status: Abnormal   Collection Time: 11/23/22  3:23 PM  Result Value Ref Range   INR 1.6 (A) 2.0 - 3.0   POC INR    POCT INR     Status: None   Collection Time: 12/02/22  3:19 PM  Result Value Ref Range   INR 2.3 2.0 - 3.0   POC INR    CBC with Differential/Platelet     Status: Abnormal   Collection Time: 12/22/22  1:21 PM  Result Value Ref Range   WBC 10.5 4.0 - 10.5 K/uL   RBC 4.56 4.22 - 5.81 Mil/uL   Hemoglobin 13.3 13.0 - 17.0 g/dL   HCT 19.1 47.8 - 29.5 %   MCV 88.4 78.0 - 100.0 fl   MCHC 32.9 30.0 - 36.0 g/dL   RDW 62.1 (H) 30.8 - 65.7 %   Platelets 451.0 (H) 150.0 - 400.0 K/uL   Neutrophils Relative % 89.7 Repeated and verified X2. (H) 43.0 - 77.0 %   Lymphocytes Relative 1.5 Repeated and verified X2. (L) 12.0 - 46.0 %   Monocytes Relative 8.2 3.0 - 12.0 %   Eosinophils Relative 0.3 0.0 - 5.0 %   Basophils Relative 0.3 0.0 - 3.0 %   Neutro Abs 9.4 (H) 1.4 - 7.7 K/uL   Lymphs Abs 0.2 (L) 0.7 - 4.0 K/uL   Monocytes Absolute 0.9 0.1 - 1.0 K/uL   Eosinophils Absolute 0.0 0.0 -  0.7 K/uL   Basophils Absolute 0.0 0.0 - 0.1 K/uL  Comprehensive metabolic panel     Status: Abnormal   Collection Time: 12/22/22  1:21 PM  Result Value Ref Range   Sodium 133 (L) 135 - 145 mEq/L   Potassium 4.6 3.5 - 5.1 mEq/L   Chloride 95 (L) 96 - 112 mEq/L   CO2 25 19 - 32 mEq/L   Glucose, Bld 239 (H) 70 - 99 mg/dL   BUN 14 6 - 23 mg/dL   Creatinine, Ser 8.46 0.40 - 1.50 mg/dL   Total Bilirubin 0.7 0.2 - 1.2 mg/dL   Alkaline Phosphatase 88 39 - 117 U/L   AST 23 0 - 37 U/L   ALT 19 0 - 53 U/L   Total Protein 6.2 6.0 - 8.3 g/dL  Albumin 4.1 3.5 - 5.2 g/dL   GFR 86.57 >84.69 mL/min    Comment: Calculated using the CKD-EPI Creatinine Equation (2021)   Calcium 9.3 8.4 - 10.5 mg/dL  Hemoglobin G2X     Status: Abnormal   Collection Time: 12/22/22  1:21 PM  Result Value Ref Range   Hgb A1c MFr Bld 10.5 (H) 4.6 - 6.5 %    Comment: Glycemic Control Guidelines for People with Diabetes:Non Diabetic:  <6%Goal of Therapy: <7%Additional Action Suggested:  >8%   Lipid panel     Status: None   Collection Time: 12/22/22  1:21 PM  Result Value Ref Range   Cholesterol 195 0 - 200 mg/dL    Comment: ATP III Classification       Desirable:  < 200 mg/dL               Borderline High:  200 - 239 mg/dL          High:  > = 528 mg/dL   Triglycerides 413.2 0.0 - 149.0 mg/dL    Comment: Normal:  <440 mg/dLBorderline High:  150 - 199 mg/dL   HDL 10.27 >25.36 mg/dL   VLDL 64.4 0.0 - 03.4 mg/dL   LDL Cholesterol 76 0 - 99 mg/dL   Total CHOL/HDL Ratio 2     Comment:                Men          Women1/2 Average Risk     3.4          3.3Average Risk          5.0          4.42X Average Risk          9.6          7.13X Average Risk          15.0          11.0                       NonHDL 97.78     Comment: NOTE:  Non-HDL goal should be 30 mg/dL higher than patient's LDL goal (i.e. LDL goal of < 70 mg/dL, would have non-HDL goal of < 100 mg/dL)  TSH     Status: None   Collection Time: 12/22/22  1:21 PM   Result Value Ref Range   TSH 1.62 0.35 - 5.50 uIU/mL  VITAMIN D 25 Hydroxy (Vit-D Deficiency, Fractures)     Status: None   Collection Time: 12/22/22  1:21 PM  Result Value Ref Range   VITD 30.05 30.00 - 100.00 ng/mL  POCT INR     Status: None   Collection Time: 12/23/22  1:23 PM  Result Value Ref Range   INR 2.9 2.0 - 3.0   POC INR    Sedimentation rate     Status: None   Collection Time: 12/24/22  2:32 PM  Result Value Ref Range   Sed Rate 16 0 - 30 mm/hr  Lyme Disease Serology w/Reflex     Status: None   Collection Time: 12/24/22  2:32 PM  Result Value Ref Range   Lyme Total Antibody EIA Negative Negative    Comment: Lyme antibodies not detected. Reflex testing is not indicated. No laboratory evidence of infection with B. burgdorferi (Lyme disease). Negative results may occur in patients recently infected (less than or equal to 14 days) with B. burgdorferi.  If recent infection  is suspected, repeat testing on a new sample collected in 7 to 14 days is recommended.   C-reactive protein     Status: Abnormal   Collection Time: 12/24/22  2:32 PM  Result Value Ref Range   CRP 23 (H) 0 - 10 mg/L  Uric acid     Status: Abnormal   Collection Time: 12/24/22  2:32 PM  Result Value Ref Range   Uric Acid 2.9 (L) 3.8 - 8.4 mg/dL    Comment:            Therapeutic target for gout patients: <6.0  CBC with Differential/Platelet     Status: Abnormal   Collection Time: 12/30/22  2:45 PM  Result Value Ref Range   WBC 8.8 3.8 - 10.8 Thousand/uL   RBC 4.16 (L) 4.20 - 5.80 Million/uL   Hemoglobin 12.2 (L) 13.2 - 17.1 g/dL   HCT 82.9 (L) 56.2 - 13.0 %   MCV 89.2 80.0 - 100.0 fL   MCH 29.3 27.0 - 33.0 pg   MCHC 32.9 32.0 - 36.0 g/dL   RDW 86.5 78.4 - 69.6 %   Platelets 344 140 - 400 Thousand/uL   MPV 8.9 7.5 - 12.5 fL   Neutro Abs 7,498 1,500 - 7,800 cells/uL   Lymphs Abs 229 (L) 850 - 3,900 cells/uL   Absolute Monocytes 959 (H) 200 - 950 cells/uL   Eosinophils Absolute 70 15 - 500  cells/uL   Basophils Absolute 44 0 - 200 cells/uL   Neutrophils Relative % 85.2 %   Total Lymphocyte 2.6 %   Monocytes Relative 10.9 %   Eosinophils Relative 0.8 %   Basophils Relative 0.5 %  Rheumatoid factor     Status: None   Collection Time: 12/30/22  2:45 PM  Result Value Ref Range   Rheumatoid fact SerPl-aCnc <10 <14 IU/mL  CK (Creatine Kinase)     Status: Abnormal   Collection Time: 12/30/22  2:45 PM  Result Value Ref Range   Total CK 38 (L) 44 - 196 U/L  Rocky mtn spotted fvr abs pnl(IgG+IgM)     Status: None   Collection Time: 12/30/22  2:45 PM  Result Value Ref Range   RMSF IgG Not Detected Not Detected   RMSF IgM Not Detected Not Detected  Sedimentation rate     Status: Abnormal   Collection Time: 12/30/22  2:45 PM  Result Value Ref Range   Sed Rate 34 (H) 0 - 20 mm/h  Comp Met (CMET)     Status: Abnormal   Collection Time: 12/30/22  2:45 PM  Result Value Ref Range   Glucose, Bld 412 (H) 65 - 99 mg/dL    Comment: .            Fasting reference interval . For someone without known diabetes, a glucose value >125 mg/dL indicates that they may have diabetes and this should be confirmed with a follow-up test. .    BUN 11 7 - 25 mg/dL   Creat 2.95 (L) 2.84 - 1.28 mg/dL   BUN/Creatinine Ratio 19 6 - 22 (calc)   Sodium 128 (L) 135 - 146 mmol/L   Potassium 4.9 3.5 - 5.3 mmol/L   Chloride 90 (L) 98 - 110 mmol/L   CO2 29 20 - 32 mmol/L   Calcium 9.2 8.6 - 10.3 mg/dL   Total Protein 5.9 (L) 6.1 - 8.1 g/dL   Albumin 4.0 3.6 - 5.1 g/dL   Globulin 1.9 1.9 - 3.7 g/dL (calc)  AG Ratio 2.1 1.0 - 2.5 (calc)   Total Bilirubin 0.8 0.2 - 1.2 mg/dL   Alkaline phosphatase (APISO) 189 (H) 35 - 144 U/L   AST 45 (H) 10 - 35 U/L   ALT 49 (H) 9 - 46 U/L  Comp Met (CMET)     Status: Abnormal   Collection Time: 01/05/23  2:06 PM  Result Value Ref Range   Sodium 129 (L) 135 - 145 mEq/L   Potassium 5.2 No hemolysis seen (H) 3.5 - 5.1 mEq/L   Chloride 93 (L) 96 - 112 mEq/L   CO2  27 19 - 32 mEq/L   Glucose, Bld 170 (H) 70 - 99 mg/dL   BUN 14 6 - 23 mg/dL   Creatinine, Ser 9.62 0.40 - 1.50 mg/dL   Total Bilirubin 1.3 (H) 0.2 - 1.2 mg/dL   Alkaline Phosphatase 290 (H) 39 - 117 U/L   AST 85 (H) 0 - 37 U/L   ALT 84 (H) 0 - 53 U/L   Total Protein 5.8 (L) 6.0 - 8.3 g/dL   Albumin 3.7 3.5 - 5.2 g/dL   GFR 95.28 >41.32 mL/min    Comment: Calculated using the CKD-EPI Creatinine Equation (2021)   Calcium 8.9 8.4 - 10.5 mg/dL  CBC with Differential/Platelet     Status: Abnormal   Collection Time: 01/05/23  2:06 PM  Result Value Ref Range   WBC 9.9 4.0 - 10.5 K/uL   RBC 4.36 4.22 - 5.81 Mil/uL   Hemoglobin 12.8 (L) 13.0 - 17.0 g/dL   HCT 44.0 (L) 10.2 - 72.5 %   MCV 89.4 78.0 - 100.0 fl   MCHC 32.8 30.0 - 36.0 g/dL   RDW 36.6 (H) 44.0 - 34.7 %   Platelets 438.0 (H) 150.0 - 400.0 K/uL   Neutrophils Relative % 95.9 Repeated and verified X2. (H) 43.0 - 77.0 %    Comment: A Manual Differential was performed and is consistent with the Automated Differential.   Lymphocytes Relative 1.1 Repeated and verified X2. (L) 12.0 - 46.0 %    Comment: A Manual Differential was performed and is consistent with the Automated Differential.   Monocytes Relative 2.6 (L) 3.0 - 12.0 %   Eosinophils Relative 0.1 0.0 - 5.0 %   Basophils Relative 0.3 0.0 - 3.0 %   Neutro Abs 9.5 (H) 1.4 - 7.7 K/uL   Lymphs Abs 0.1 (L) 0.7 - 4.0 K/uL   Monocytes Absolute 0.3 0.1 - 1.0 K/uL   Eosinophils Absolute 0.0 0.0 - 0.7 K/uL   Basophils Absolute 0.0 0.0 - 0.1 K/uL  CBC with Differential     Status: Abnormal   Collection Time: 01/07/23 12:46 PM  Result Value Ref Range   WBC 11.1 (H) 4.0 - 10.5 K/uL   RBC 4.49 4.22 - 5.81 MIL/uL   Hemoglobin 13.2 13.0 - 17.0 g/dL   HCT 42.5 (L) 95.6 - 38.7 %   MCV 86.4 80.0 - 100.0 fL   MCH 29.4 26.0 - 34.0 pg   MCHC 34.0 30.0 - 36.0 g/dL   RDW 56.4 33.2 - 95.1 %   Platelets 386 150 - 400 K/uL   nRBC 0.0 0.0 - 0.2 %   Neutrophils Relative % 93 %   Neutro Abs  10.3 (H) 1.7 - 7.7 K/uL   Lymphocytes Relative 1 %   Lymphs Abs 0.1 (L) 0.7 - 4.0 K/uL   Monocytes Relative 5 %   Monocytes Absolute 0.5 0.1 - 1.0 K/uL   Eosinophils Relative 0 %  Eosinophils Absolute 0.0 0.0 - 0.5 K/uL   Basophils Relative 0 %   Basophils Absolute 0.0 0.0 - 0.1 K/uL   Immature Granulocytes 1 %   Abs Immature Granulocytes 0.05 0.00 - 0.07 K/uL    Comment: Performed at [facility], [address] [place], [place] [address]  Comprehensive metabolic panel     Status: Abnormal   Collection Time: 01/07/23 12:46 PM  Result Value Ref Range   Sodium 125 (L) 135 - 145 mmol/L   Potassium 4.1 3.5 - 5.1 mmol/L   Chloride 91 (L) 98 - 111 mmol/L   CO2 22 22 - 32 mmol/L   Glucose, Bld 239 (H) 70 - 99 mg/dL    Comment: Glucose reference range applies only to samples taken after fasting for at least 8 hours.   BUN 13 8 - 23 mg/dL   Creatinine, Ser 6.04 0.61 - 1.24 mg/dL   Calcium 8.8 (L) 8.9 - 10.3 mg/dL   Total Protein 6.8 6.5 - 8.1 g/dL   Albumin 3.6 3.5 - 5.0 g/dL   AST 74 (H) 15 - 41 U/L   ALT 69 (H) 0 - 44 U/L   Alkaline Phosphatase 216 (H) 38 - 126 U/L   Total Bilirubin 1.1 0.3 - 1.2 mg/dL   GFR, Estimated >54 >09 mL/min    Comment: (NOTE) Calculated using the CKD-EPI Creatinine Equation (2021)    Anion gap 12 5 - 15    Comment: Performed at [facility], [address] [place], [place] [address]  Protime-INR     Status: Abnormal   Collection Time: 01/07/23  3:35 PM  Result Value Ref Range   Prothrombin Time 22.3 (H) 11.4 - 15.2 seconds   INR 1.9 (H) 0.8 - 1.2    Comment: (NOTE) INR goal varies based on device and disease states. Performed at [facility], [address] [place], [place] [address]   Comp Met (CMET)     Status: Abnormal   Collection Time: 01/10/23 10:12 AM  Result Value Ref Range   Sodium 132 (L) 135 - 145 mEq/L   Potassium 4.7 3.5 - 5.1 mEq/L   Chloride 93 (L) 96 - 112 mEq/L   CO2 30 19 - 32 mEq/L    Glucose, Bld 143 (H) 70 - 99 mg/dL   BUN 13 6 - 23 mg/dL   Creatinine, Ser 5.62 0.40 - 1.50 mg/dL   Total Bilirubin 0.9 0.2 - 1.2 mg/dL   Alkaline Phosphatase 157 (H) 39 - 117 U/L   AST 41 (H) 0 - 37 U/L   ALT 45 0 - 53 U/L   Total Protein 5.8 (L) 6.0 - 8.3 g/dL   Albumin 4.0 3.5 - 5.2 g/dL   GFR 13.08 >65.78 mL/min    Comment: Calculated using the CKD-EPI Creatinine Equation (2021)   Calcium 9.5 8.4 - 10.5 mg/dL  CBC with Differential/Platelet     Status: Abnormal   Collection Time: 01/10/23 10:12 AM  Result Value Ref Range   WBC 11.4 (H) 4.0 - 10.5 K/uL   RBC 4.27 4.22 - 5.81 Mil/uL   Hemoglobin 12.4 (L) 13.0 - 17.0 g/dL   HCT 46.9 (L) 62.9 - 52.8 %   MCV 88.9 78.0 - 100.0 fl   MCHC 32.6 30.0 - 36.0 g/dL   RDW 41.3 (H) 24.4 - 01.0 %   Platelets 461.0 (H) 150.0 - 400.0 K/uL   Neutrophils Relative % 87.9 Repeated and verified X2. (H)  43.0 - 77.0 %   Lymphocytes Relative 1.7 (L) 12.0 - 46.0 %   Monocytes Relative 9.4 3.0 - 12.0 %   Eosinophils Relative 0.7 0.0 - 5.0 %   Basophils Relative 0.3 0.0 - 3.0 %   Neutro Abs 10.1 (H) 1.4 - 7.7 K/uL   Lymphs Abs 0.2 (L) 0.7 - 4.0 K/uL   Monocytes Absolute 1.1 (H) 0.1 - 1.0 K/uL   Eosinophils Absolute 0.1 0.0 - 0.7 K/uL   Basophils Absolute 0.0 0.0 - 0.1 K/uL  POCT INR     Status: None   Collection Time: 01/20/23  1:49 PM  Result Value Ref Range   INR 2.1 2.0 - 3.0   POC INR    Pulmonary function test     Status: None (Preliminary result)   Collection Time: 02/01/23 10:39 AM  Result Value Ref Range   FVC-Pre 3.13 L   FVC-%Pred-Pre 76 %   FEV1-Pre 2.49 L   FEV1-%Pred-Pre 85 %   FEV6-Pre 3.12 L   FEV6-%Pred-Pre 82 %   Pre FEV1/FVC ratio 79 %   FEV1FVC-%Pred-Pre 110 %   Pre FEV6/FVC Ratio 100 %   FEV6FVC-%Pred-Pre 107 %   FEF 25-75 Pre 2.34 L/sec   FEF2575-%Pred-Pre 115 %   DLCO unc 13.59 ml/min/mmHg   DLCO unc % pred 55 %   DLCO cor 14.58 ml/min/mmHg   DLCO cor % pred 59 %   DL/VA 0.98 ml/min/mmHg/L   DL/VA % pred 83 %        has a past medical history of Acute pharyngitis (10/21/2013), Allergy, Anxiety and depression (10/25/2011), BPH (benign prostatic hyperplasia) (04/23/2012), Chicken pox (as a child), DDD (degenerative disc disease), DDD (degenerative disc disease), lumbosacral, Diabetes mellitus, Dyspnea, ED (erectile dysfunction) (04/23/2012), Elevated BP, 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   reports that he quit smoking about 33 years ago. His smoking use included cigarettes. He started smoking about 53 years ago. He has a 20 pack-year smoking history. He has never used smokeless tobacco.  Past Surgical History:  Procedure Laterality Date   ATRIAL FIBRILLATION ABLATION N/A 01/27/2022   Procedure: ATRIAL FIBRILLATION ABLATION;  Surgeon: Regan Lemming, MD;  [facility]: [facility];  Service: Cardiovascular;  Laterality: N/A;   BACK SURGERY  2012 and 1994   Dr Charlesetta Garibaldi, screws and cage in low back   BIOPSY  01/09/2020   Procedure: BIOPSY;  Surgeon: Kathi Der, MD;  Location: [facility];  Service: Gastroenterology;;   BRONCHIAL BIOPSY  11/17/2020   Procedure: BRONCHIAL BIOPSIES;  Surgeon: Tomma Lightning, MD;  Location: [facility];  Service: Endoscopy;;   BRONCHIAL BRUSHINGS  11/17/2020   Procedure: BRONCHIAL BRUSHINGS;  Surgeon: Tomma Lightning, MD;  Location: [facility];  Service: Endoscopy;;   BRONCHIAL WASHINGS  11/17/2020   Procedure: BRONCHIAL WASHINGS;  Surgeon: Tomma Lightning, MD;  Location: [facility];  Service: Endoscopy;;   COLONOSCOPY WITH PROPOFOL N/A 01/09/2020   Procedure: COLONOSCOPY WITH PROPOFOL;  Surgeon:  Kathi Der, MD;  Location: [facility];  Service: Gastroenterology;  Laterality: N/A;   ESOPHAGOGASTRODUODENOSCOPY (EGD) WITH PROPOFOL N/A 01/09/2020   Procedure: ESOPHAGOGASTRODUODENOSCOPY (EGD) WITH PROPOFOL;  Surgeon: Kathi Der, MD;  Location: [facility];  Service: Gastroenterology;  Laterality: N/A;   EYE SURGERY Bilateral  2016   HARDWARE REVISION  03/12/2019   REVISION OF HARDWARE THORACIC TEN-THORACIC ELEVEN WITH APPLICATION OF ADDITIONAL RODS AND ROD SLEEVES THORACIC EIGHT-THORACIC TWELVE 12-LUMBAR ONE (N/A )   HEMOSTASIS CONTROL  11/17/2020   Procedure: HEMOSTASIS CONTROL;  Surgeon: Tomma Lightning, MD;  Location: [facility];  Service: Endoscopy;;   IR US GUIDE BX ASP/DRAIN  11/13/2020   LAMINECTOMY  02/12/2019   DECOMPRESSIVE LAMINECTOMY THORACIC NINE-THORACIC TEN AND THORACIC TEN-THORACIC ELEVEN, EXTENSION OF THORACOLUMBAR FUSION FROM THORACIC TEN TO THORACIC FIVE, LOCAL BONE GRAFT, ALLOGRAFT AND VIVIGEN (N/A)   POLYPECTOMY  01/09/2020   Procedure: POLYPECTOMY;  Surgeon: Kathi Der, MD;  Location: [facility];  Service: Gastroenterology;;   REMOVAL OF RODS AND PEDICLE SCREWS T5 AND T6, EXPLORATION OF FUSION, BIOPSY TRANSPEDICULAR T5 AND T6 (N/A )  09/14/2020   SPINAL FUSION N/A 02/12/2019   Procedure: DECOMPRESSIVE LAMINECTOMY THORACIC NINE-THORACIC TEN AND THORACIC TEN-THORACIC ELEVEN, EXTENSION OF THORACOLUMBAR FUSION FROM THORACIC TEN TO THORACIC FIVE, LOCAL BONE GRAFT, ALLOGRAFT AND VIVIGEN;  Surgeon: Kerrin Champagne, MD;  Location: [facility];  Service: Orthopedics;  Laterality: N/A;  DECOMPRESSIVE LAMINECTOMY THORACIC NINE-THORACIC TEN AND THORACIC TEN-THORACIC ELEVEN, EXTENSION OF TH   SPINAL FUSION N/A 03/12/2019   Procedure: REVISION OF HARDWARE THORACIC TEN-THORACIC ELEVEN WITH APPLICATION OF ADDITIONAL RODS AND ROD SLEEVES THORACIC EIGHT-THORACIC TWELVE 12-LUMBAR ONE;  Surgeon: Kerrin Champagne, MD;  Location: [facility];  Service: Orthopedics;  Laterality: N/A;    TONSILLECTOMY     as child   torn rotator cuff  2010   right   VIDEO BRONCHOSCOPY N/A 11/17/2020   Procedure: VIDEO BRONCHOSCOPY WITH FLUORO;  Surgeon: Tomma Lightning, MD;  Location: [facility];  Service: Endoscopy;  Laterality: N/A;    Allergies  Allergen Reactions   Daptomycin     Drug induced pneumonia    Diltiazem Other (See Comments)    Passed out   Ofev [Nintedanib] Diarrhea    fatigue   Pirfenidone Diarrhea    fatigue    Immunization History  Administered Date(s) Administered   COVID-19, mRNA, vaccine(Comirnaty)12 years and older 05/23/2022   Fluad Quad(high Dose 65+) 04/05/2019, 06/29/2020, 05/18/2021, 04/22/2022   Influenza Split 04/23/2012   Influenza Whole 02/16/2011   Influenza, High Dose Seasonal PF 05/06/2013, 03/25/2016, 05/05/2017, 06/07/2018   PFIZER(Purple Top)SARS-COV-2 Vaccination 08/23/2019, 09/13/2019, 06/07/2020   Pfizer Covid-19 Vaccine Bivalent Booster 19yrs & up 04/29/2021   Pneumococcal Conjugate-13 03/25/2016   Pneumococcal Polysaccharide-23 01/20/2009, 10/19/2011   Respiratory Syncytial Virus Vaccine,Recomb Aduvanted(Arexvy) 04/09/2022   Td 07/09/2003, 07/18/2008   Td (Adult) 07/09/2003, 07/18/2008   Tdap 07/20/2011   Zoster, Live 04/27/2016    Family History  Problem Relation Age of Onset   Hypertension Mother    Diabetes Mother        type 2   Cancer Mother 66       breast in remission   Emphysema Father        smoker   COPD Father        smoker   Stroke Father 44       mini   Heart disease Father    Hypertension Sister    Hyperlipidemia Sister    Scoliosis Sister    Osteoporosis Sister    Hypertension Maternal Grandmother    Scoliosis Maternal Grandmother    Heart disease Maternal Grandfather    Heart disease Paternal Grandfather        smoker   Heart disease Daughter      Current Outpatient Medications:  albuterol (PROVENTIL HFA) 108 (90 Base) MCG/ACT inhaler, TAKE 2 PUFFS BY MOUTH EVERY 6 HOURS AS NEEDED FOR  WHEEZE OR SHORTNESS OF BREATH, Disp: 8 g, Rfl: 5   atorvastatin (LIPITOR) 10 MG tablet, TAKE 1 TABLET BY MOUTH EVERY DAY, Disp: 90 tablet, Rfl: 1   busPIRone (BUSPAR) 15 MG tablet, Take 1 tablet (15 mg total) by mouth 3 (three) times daily., Disp: 270 tablet, Rfl: 1   dextromethorphan-guaiFENesin (MUCINEX DM) 30-600 MG 12hr tablet, Take 1 tablet by mouth 2 (two) times daily., Disp: , Rfl:    doxycycline (VIBRA-TABS) 100 MG tablet, Take 1 tablet (100 mg total) by mouth 2 (two) times daily., Disp: 20 tablet, Rfl: 0   fluticasone (FLONASE) 50 MCG/ACT nasal spray, SPRAY 2 SPRAYS INTO EACH NOSTRIL EVERY DAY, Disp: 48 mL, Rfl: 1   Fluticasone-Umeclidin-Vilant (TRELEGY ELLIPTA) 200-62.5-25 MCG/ACT AEPB, Inhale 1 puff into the lungs daily., Disp: 60 each, Rfl: 5   glucose blood (CONTOUR NEXT TEST) test strip, Check blood sugar once daily, Disp: 100 each, Rfl: 12   insulin glargine (LANTUS SOLOSTAR) 100 UNIT/ML Solostar Pen, Inject 26 Units into the skin daily., Disp: 15 mL, Rfl: 5   insulin lispro (HUMALOG) 100 UNIT/ML injection, Inject 0.03 mLs (3 Units total) into the skin 3 (three) times daily with meals., Disp: 100 mL, Rfl: 1   Insulin Pen Needle (B-D UF III MINI PEN NEEDLES) 31G X 5 MM MISC, USE WITH LANTUS, Disp: 100 each, Rfl: 3   Insulin Pen Needle 32G X 4 MM MISC, Use with insulin, Disp: 1 each, Rfl: 3   Insulin Syringe-Needle U-100 (B-D INS SYR ULTRAFINE 1CC/30G) 30G X 1/2" 1 ML MISC, USE WITH LANTUS, Disp: 200 each, Rfl: 1   Insulin Syringes, Disposable, U-100 1 ML MISC, USE WITH LANTUS, Disp: 200 each, Rfl: 2   JANUVIA 50 MG tablet, TAKE 1 TABLET BY MOUTH EVERY DAY, Disp: 30 tablet, Rfl: 3   meclizine (ANTIVERT) 12.5 MG tablet, Take 1 tablet (12.5 mg total) by mouth 3 (three) times daily as needed for dizziness., Disp: 30 tablet, Rfl: 2   metFORMIN (GLUCOPHAGE) 1000 MG tablet, TAKE 1 TABLET (1,000 MG TOTAL) BY MOUTH TWICE A DAY WITH FOOD, Disp: 180 tablet, Rfl: 0   oxyCODONE-acetaminophen  (PERCOCET/ROXICET) 5-325 MG tablet, Take 1 tablet by mouth every 4 (four) hours as needed for pain- frequency change, Disp: 180 tablet, Rfl: 0   pantoprazole (PROTONIX) 40 MG tablet, Take 1 tablet (40 mg total) by mouth 2 (two) times daily before a meal., Disp: 180 tablet, Rfl: 1   predniSONE (DELTASONE) 20 MG tablet, Take 2 tablets (40 mg total) by mouth daily with breakfast for 3 days, THEN 1.5 tablets (30 mg total) daily with breakfast for 3 days, THEN 1 tablet (20 mg total) daily with breakfast for 3 days, THEN 0.5 tablets (10 mg total) daily with breakfast for 3 days., Disp: 17 tablet, Rfl: 0   pregabalin (LYRICA) 75 MG capsule, TAKE 1 CAPSULE BY MOUTH TWICE A DAY (Patient taking differently: Take 75 mg by mouth 2 (two) times daily.), Disp: 60 capsule, Rfl: 3   tamsulosin (FLOMAX) 0.4 MG CAPS capsule, Take 2 capsules (0.8 mg total) by mouth daily., Disp: 180 capsule, Rfl: 1   tiZANidine (ZANAFLEX) 4 MG tablet, Take 1 and 1/2 tablets (6 mg total) by mouth every 4 hours., Disp: 180 tablet, Rfl: 2   venlafaxine XR (EFFEXOR-XR) 150 MG 24 hr capsule, TAKE 1 CAPSULE BY MOUTH DAILY WITH BREAKFAST., Disp: 90 capsule,  Rfl: 0   warfarin (COUMADIN) 5 MG tablet, TAKE 1 TO 2 TABLETS DAILY OR AS PRESCRIBED BY COUMADIN CLINIC, Disp: 130 tablet, Rfl: 1   zolpidem (AMBIEN) 10 MG tablet, Take 0.5-1 tablets (5-10 mg total) by mouth at bedtime as needed for sleep., Disp: 15 tablet, Rfl: 3   predniSONE (DELTASONE) 5 MG tablet, 5 tab po day 1, 4 tab po day 2, 3 tab po day 4, 2 tab po day 5 and 1 tab po day 6. (Patient not taking: Reported on 02/02/2023), Disp: 15 tablet, Rfl: 0      Objective:   Vitals:   02/02/23 1007  BP: 94/84  Pulse: 81  Temp: 98 F (36.7 C)  TempSrc: Oral  SpO2: 98%  Weight: 184 lb 12.8 oz (83.8 kg)  Height: 5\' 10"  (1.778 m)    Estimated body mass index is 26.52 kg/m as calculated from the following:   Height as of this encounter: 5\' 10"  (1.778 m).   Weight as of this encounter: 184  lb 12.8 oz (83.8 kg).  @WEIGHTCHANGE @  [employer]   02/02/23 1007  Weight: 184 lb 12.8 oz (83.8 kg)     Physical Exam   General: No distress. Looks well O2 at rest: no Cane present: no Sitting in wheel chair: no Frail: no Obese: no Neuro: Alert and Oriented x 3. GCS 15. Speech normal Psych: Pleasant Resp:  Barrel Chest - no.  Wheeze - no, Crackles - mild at base, No overt respiratory distress CVS: Normal heart sounds. Murmurs - no Ext: Stigmata of Connective Tissue Disease - no HEENT: Normal upper airway. PEERL +. No post nasal drip        Assessment:       ICD-10-CM   1. 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 Medication monitoring encounter  Z51.81 INR/PT    Hepatic function panel    INR/PT    3. Other fatigue  R53.83 D-dimer, quantitative    D-dimer, quantitative    4. 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 But Esbriet and ofev intolerant  Plan - cotontinue TRELEGY as before - Continue oxygen with at night and exertion > 600 feet (200 yards) - Continue prednisone  5mg  per day (once you complete current taper)  - look at tapering next visit -- No more esbriet and ofev due to side effects  - Consider clinical trials Q4 2024 or Q1 2024   - [facility] will pre-screen you and then we can choose - Do spirometry and dlco in 16 weeks  Abnormal LFT after stopping ofev  Plan  - recheck LFT 02/02/2023    Fatigue due to nintedanib  Improved after stopping ofev  Plan - see above   History of DVT spring 2022 and  pulmonary embolism December 2022 while on xaretlo for a fib  - considerd as xarelto failure and on coumading  - stable  Plan - Coumadin for A Fib and blood clot per PCP  and  cardiology  - life long anticoagulation for clots but leave decision to cardiology  - NO  testosterone please - check d-dimer and INR 02/02/2023 -> ? Reconsider xarelto in dec 2024 (2 years since PE) based on d-dimer  Follow-up  - Do spirometry and DLCO in 16 weeks -Return to see Dr. Marchelle Gearing in 16  weeks for 15-minute visit  - [simple walking desaturation test and symptom score at follow-up] =   FOLLOWUP Return in about 16 weeks (around 05/25/2023) for 15 min visit, after Cleda Daub and DLCO, ILD, with Dr Marchelle Gearing, Face to Face Visit.  ( Level 05 visit E&M 2024: Estb >= 40 min  visit type: on-site physical face to visit  in total care time and counseling or/and coordination of care by this undersigned MD - Dr Kalman Shan. This includes one or more of the following on this same day 02/02/2023: pre-charting, chart review, note writing, documentation discussion of test results, diagnostic or treatment recommendations, prognosis, risks and benefits of management options, instructions, education, compliance or risk-factor reduction. It excludes time spent by the CMA or office staff in the care of the patient. Actual time 55 min)   SIGNATURE    Dr. Kalman Shan, M.D., F.C.C.P,  Pulmonary and Critical Care Medicine Staff Physician, [facility] Director - Interstitial Lung Disease  Program  Pulmonary Fibrosis [facility] at [facility] [place], [place], [address]  Pager: [phone], If no answer or between  15:00h - 7:00h: call [phone] Telephone: [phone]  11:09 AM 02/02/2023

## 2023-02-02 NOTE — Patient Instructions (Addendum)
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 But Esbriet and ofev intolerant  Plan - cotontinue TRELEGY as before - Continue oxygen with at night and exertion > 600 feet (200 yards) - Continue prednisone  5mg  per day (once you complete current taper)  - look at tapering next visit -- No more esbriet and ofev due to side effects  - Consider clinical trials Q4 2024 or Q1 2024   - [facility] will pre-screen you and then we can choose - Do spirometry and dlco in 16 weeks  Abnormal LFT after stopping ofev  Plan  - recheck LFT 02/02/2023    Fatigue due to nintedanib  Improved after stopping ofev  Plan - see above   History of DVT spring 2022 and  pulmonary embolism December 2022 while on xaretlo for a fib  - considerd as xarelto failure and on coumading  - stable  Plan - Coumadin for A Fib and blood clot per PCP  and cardiology  - life long anticoagulation for clots but leave decision to cardiology  - NO  testosterone please - check d-dimer and INR 02/02/2023 -> ? Reconsider xarelto in dec 2024 (2 years since PE) based on d-dimer    Follow-up  - Do spirometry and DLCO in 16 weeks -Return to see Dr. Marchelle Gearing in 16  weeks for 15-minute visit  - [simple walking desaturation test and symptom score at follow-up] =

## 2023-02-03 ENCOUNTER — Other Ambulatory Visit: Payer: Self-pay | Admitting: Family Medicine

## 2023-02-03 ENCOUNTER — Encounter: Payer: Medicare Other | Admitting: Physical Therapy

## 2023-02-03 DIAGNOSIS — K8020 Calculus of gallbladder without cholecystitis without obstruction: Secondary | ICD-10-CM | POA: Diagnosis not present

## 2023-02-03 MED ORDER — ZOLPIDEM TARTRATE 10 MG PO TABS: 5.0000 mg | ORAL_TABLET | Freq: Every evening | ORAL | 3 refills | Status: DC | PRN

## 2023-02-06 ENCOUNTER — Encounter: Payer: Medicare Other | Admitting: Physical Therapy

## 2023-02-10 NOTE — Telephone Encounter (Signed)
I called and spoke with the pt and notified of results/recs on voicemail ok per [facility]

## 2023-02-16 ENCOUNTER — Other Ambulatory Visit: Payer: Self-pay | Admitting: Cardiology

## 2023-02-16 DIAGNOSIS — I480 Paroxysmal atrial fibrillation: Secondary | ICD-10-CM

## 2023-02-16 NOTE — Telephone Encounter (Signed)
Refill request for warfarin:  Last INR was 2.1 on 01/20/23 Next INR due 02/24/23 LOV was 11/07/22  Refill approved.

## 2023-02-17 ENCOUNTER — Encounter: Payer: Self-pay | Admitting: Family Medicine

## 2023-02-17 ENCOUNTER — Other Ambulatory Visit: Payer: Self-pay

## 2023-02-17 MED ORDER — LANTUS SOLOSTAR 100 UNIT/ML ~~LOC~~ SOPN: 26.0000 [IU] | PEN_INJECTOR | Freq: Every day | SUBCUTANEOUS | 5 refills | Status: DC

## 2023-02-17 NOTE — Telephone Encounter (Signed)
Refill sent.

## 2023-02-18 ENCOUNTER — Encounter: Payer: Self-pay | Admitting: Family Medicine

## 2023-02-19 NOTE — Assessment & Plan Note (Signed)
Encourage heart healthy diet such as MIND or DASH diet, increase exercise, avoid trans fats, simple carbohydrates and processed foods, consider a krill or fish or flaxseed oil cap daily.  °

## 2023-02-19 NOTE — Assessment & Plan Note (Signed)
Encouraged heart healthy diet, increase exercise, avoid trans fats, consider a krill oil cap daily tolerating Atorvastatin

## 2023-02-19 NOTE — Assessment & Plan Note (Signed)
Rate controlled tolerating meds 

## 2023-02-19 NOTE — Assessment & Plan Note (Signed)
No recent exacerbation 

## 2023-02-19 NOTE — Assessment & Plan Note (Signed)
Well controlled, no changes to meds. Encouraged heart healthy diet such as the DASH diet and exercise as tolerated.  °

## 2023-02-20 ENCOUNTER — Ambulatory Visit (INDEPENDENT_AMBULATORY_CARE_PROVIDER_SITE_OTHER): Payer: Medicare Other | Admitting: Family Medicine

## 2023-02-20 ENCOUNTER — Encounter: Payer: Self-pay | Admitting: Family Medicine

## 2023-02-20 ENCOUNTER — Other Ambulatory Visit: Payer: Self-pay | Admitting: *Deleted

## 2023-02-20 VITALS — BP 116/72 | HR 63 | Temp 97.5°F | Resp 16 | Ht 70.0 in | Wt 184.0 lb

## 2023-02-20 DIAGNOSIS — J439 Emphysema, unspecified: Secondary | ICD-10-CM | POA: Diagnosis not present

## 2023-02-20 DIAGNOSIS — I10 Essential (primary) hypertension: Secondary | ICD-10-CM | POA: Diagnosis not present

## 2023-02-20 DIAGNOSIS — I4891 Unspecified atrial fibrillation: Secondary | ICD-10-CM | POA: Diagnosis not present

## 2023-02-20 DIAGNOSIS — E871 Hypo-osmolality and hyponatremia: Secondary | ICD-10-CM

## 2023-02-20 DIAGNOSIS — E782 Mixed hyperlipidemia: Secondary | ICD-10-CM

## 2023-02-20 DIAGNOSIS — D72829 Elevated white blood cell count, unspecified: Secondary | ICD-10-CM | POA: Diagnosis not present

## 2023-02-20 DIAGNOSIS — E7849 Other hyperlipidemia: Secondary | ICD-10-CM

## 2023-02-20 MED ORDER — VENLAFAXINE HCL ER 150 MG PO CP24: 150.0000 mg | ORAL_CAPSULE | Freq: Every day | ORAL | 0 refills | Status: DC

## 2023-02-21 ENCOUNTER — Other Ambulatory Visit: Payer: Self-pay

## 2023-02-21 ENCOUNTER — Encounter: Payer: Self-pay | Admitting: Family Medicine

## 2023-02-21 DIAGNOSIS — D72829 Elevated white blood cell count, unspecified: Secondary | ICD-10-CM | POA: Insufficient documentation

## 2023-02-21 NOTE — Progress Notes (Signed)
Subjective:    [name], male    DOB: [birth year], 77 y.o.   MRN: [id number]  Chief Complaint  Patient presents with   Follow-up    Follow up    HPI Patient is in today for follow up on chronic medical concerns. No recent febrile illness or hospitalizations. Denies CP/palp/SOB/HA/congestion/fevers/GI or GU c/o. Taking meds as prescribed. He is accompanied by his wife and they note he is doing much better than at his last visit. 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   Esophageal reflux 02/10/2015   Fatigue    HTN (hypertension)    Hyperglycemia    preDM    Hyperlipidemia    Insomnia    Low back pain radiating to both legs 01/16/2017   Measles as a child   Overweight(278.02)    Peripheral neuropathy 08/07/2019   Personal history of colonic polyps 10/27/2012   Follows with [facility]   Pneumonia    " walking"   Preventative health care    Testosterone deficiency 05/23/2012   Wears glasses     Past Surgical History:  Procedure Laterality Date   ATRIAL FIBRILLATION ABLATION N/A 01/27/2022   Procedure: ATRIAL FIBRILLATION ABLATION;  Surgeon: Regan Lemming, MD;  Location: [facility];  Service: Cardiovascular;  Laterality: N/A;   BACK SURGERY  2012 and 1994   Dr Charlesetta Garibaldi, screws and cage in low back   BIOPSY  01/09/2020   Procedure: BIOPSY;  Surgeon: Kathi Der, MD;  Location: [facility];  Service: Gastroenterology;;   BRONCHIAL BIOPSY  11/17/2020    Procedure: BRONCHIAL BIOPSIES;  Surgeon: Tomma Lightning, MD;  Location: [facility];  Service: Endoscopy;;   BRONCHIAL BRUSHINGS  11/17/2020   Procedure: BRONCHIAL BRUSHINGS;  Surgeon: Tomma Lightning, MD;  Location: [facility];  Service: Endoscopy;;   BRONCHIAL WASHINGS  11/17/2020   Procedure: BRONCHIAL WASHINGS;  Surgeon: Tomma Lightning, MD;  Location: [facility];  Service: Endoscopy;;   COLONOSCOPY WITH PROPOFOL N/A 01/09/2020   Procedure: COLONOSCOPY WITH PROPOFOL;  Surgeon: Kathi Der, MD;  Location: [facility];  Service: Gastroenterology;  Laterality: N/A;   ESOPHAGOGASTRODUODENOSCOPY (EGD) WITH PROPOFOL N/A 01/09/2020   Procedure: ESOPHAGOGASTRODUODENOSCOPY (EGD) WITH PROPOFOL;  Surgeon: Kathi Der, MD;  Location: [facility];  Service: Gastroenterology;  Laterality: N/A;   EYE SURGERY Bilateral    2016   HARDWARE REVISION  03/12/2019   REVISION OF HARDWARE THORACIC TEN-THORACIC ELEVEN WITH APPLICATION OF ADDITIONAL RODS AND ROD SLEEVES THORACIC EIGHT-THORACIC TWELVE 12-LUMBAR ONE (N/A )   HEMOSTASIS CONTROL  11/17/2020   Procedure: HEMOSTASIS CONTROL;  Surgeon: Tomma Lightning, MD;  Location: [facility];  Service: Endoscopy;;   IR US GUIDE BX ASP/DRAIN  11/13/2020   LAMINECTOMY  02/12/2019   DECOMPRESSIVE LAMINECTOMY THORACIC NINE-THORACIC TEN AND THORACIC TEN-THORACIC ELEVEN, EXTENSION OF THORACOLUMBAR FUSION FROM THORACIC TEN TO THORACIC FIVE, LOCAL BONE GRAFT, ALLOGRAFT AND VIVIGEN (N/A)   POLYPECTOMY  01/09/2020   Procedure: POLYPECTOMY;  Surgeon: Kathi Der, MD;  Location: [facility];  Service: Gastroenterology;;   REMOVAL OF RODS AND PEDICLE SCREWS T5 AND T6, EXPLORATION OF FUSION, BIOPSY TRANSPEDICULAR T5 AND T6 (N/A )  09/14/2020   SPINAL FUSION N/A 02/12/2019   Procedure: DECOMPRESSIVE LAMINECTOMY THORACIC NINE-THORACIC TEN AND THORACIC TEN-THORACIC ELEVEN, EXTENSION OF THORACOLUMBAR FUSION FROM THORACIC TEN TO THORACIC FIVE, LOCAL BONE  GRAFT, ALLOGRAFT AND VIVIGEN;  Surgeon: Kerrin Champagne, MD;  Location: [facility];  Service: Orthopedics;  Laterality: N/A;  DECOMPRESSIVE LAMINECTOMY THORACIC NINE-THORACIC TEN AND THORACIC TEN-THORACIC ELEVEN, EXTENSION OF TH   SPINAL FUSION N/A 03/12/2019   Procedure: REVISION OF HARDWARE THORACIC TEN-THORACIC ELEVEN WITH APPLICATION OF ADDITIONAL RODS AND ROD SLEEVES THORACIC EIGHT-THORACIC TWELVE 12-LUMBAR ONE;  Surgeon: Kerrin Champagne, MD;  Location: [facility];  Service: Orthopedics;  Laterality: N/A;   TONSILLECTOMY     as child   torn rotator cuff  2010   right   VIDEO BRONCHOSCOPY N/A 11/17/2020   Procedure: VIDEO BRONCHOSCOPY WITH FLUORO;  Surgeon: Tomma Lightning, MD;  Location: [facility];  Service: Endoscopy;  Laterality: N/A;    Family History  Problem Relation Age of Onset   Hypertension Mother    Diabetes Mother        type 2   Cancer Mother 60       breast in remission   Emphysema Father        smoker   COPD Father        smoker   Stroke Father 41       mini   Heart disease Father    Hypertension Sister    Hyperlipidemia Sister    Scoliosis Sister    Osteoporosis Sister    Hypertension Maternal Grandmother    Scoliosis Maternal Grandmother    Heart disease Maternal Grandfather    Heart disease Paternal Grandfather        smoker   Heart disease Daughter     Social History   Socioeconomic History   Marital status: Married    Spouse name: Not on file   Number of children: Not on file   Years of education: Not on file   Highest education level: 12th grade  Occupational History   Not on file  Tobacco Use   Smoking status: Former    Current packs/day: 0.00    Average packs/day: 1 pack/day for 20.0 years (20.0 ttl pk-yrs)    Types: Cigarettes    Start date: 07/18/1969    Quit date: 07/18/1989    Years since quitting: 33.6   Smokeless tobacco: Never   Tobacco comments:    Former smoker 03/16/22  Vaping Use   Vaping status: Never Used  Substance and Sexual  Activity   Alcohol use: Yes    Alcohol/week: 2.0 standard drinks of alcohol    Types: 2 Standard drinks or equivalent per week    Comment: couple beers here and there 03/16/22   Drug use: No   Sexual activity: Yes    Comment: lives with wife, still working, no dietary restrictions, continues to exercise intermittently  Other Topics Concern   Not on file  Social History Narrative   Not on file   Social Determinants of Health   Financial Resource Strain: Low Risk  (01/13/2023)   Overall Financial Resource Strain (CARDIA)    Difficulty of Paying Living Expenses: Not hard at all  Food Insecurity: No Food Insecurity (01/13/2023)   Hunger Vital Sign  Worried About Programme researcher, broadcasting/film/video in the Last Year: Never true    Ran Out of Food in the Last Year: Never true  Transportation Needs: No Transportation Needs (01/13/2023)   PRAPARE - Administrator, Civil Service (Medical): No    Lack of Transportation (Non-Medical): No  Physical Activity: Inactive (01/13/2023)   Exercise Vital Sign    Days of Exercise per Week: 0 days    Minutes of Exercise per Session: 0 min  Stress: Stress Concern Present (01/13/2023)   [employer] of Occupational Health - Occupational Stress Questionnaire    Feeling of Stress : To some extent  Social Connections: Socially Integrated (01/13/2023)   Social Connection and Isolation Panel [NHANES]    Frequency of Communication with Friends and Family: Three times a week    Frequency of Social Gatherings with Friends and Family: Patient declined    Attends Religious Services: More than 4 times per year    Active Member of [employer] or Organizations: Yes    Attends Engineer, structural: More than 4 times per year    Marital Status: Married  Catering manager Violence: Not At Risk (01/13/2023)   Humiliation, Afraid, Rape, and Kick questionnaire    Fear of Current or Ex-Partner: No    Emotionally Abused: No    Physically Abused: No    Sexually Abused:  No    Outpatient Medications Prior to Visit  Medication Sig Dispense Refill   albuterol (PROVENTIL HFA) 108 (90 Base) MCG/ACT inhaler TAKE 2 PUFFS BY MOUTH EVERY 6 HOURS AS NEEDED FOR WHEEZE OR SHORTNESS OF BREATH 8 g 5   atorvastatin (LIPITOR) 10 MG tablet TAKE 1 TABLET BY MOUTH EVERY DAY 90 tablet 1   busPIRone (BUSPAR) 15 MG tablet Take 1 tablet (15 mg total) by mouth 3 (three) times daily. 270 tablet 1   dextromethorphan-guaiFENesin (MUCINEX DM) 30-600 MG 12hr tablet Take 1 tablet by mouth 2 (two) times daily.     doxycycline (VIBRA-TABS) 100 MG tablet Take 1 tablet (100 mg total) by mouth 2 (two) times daily. 20 tablet 0   fluticasone (FLONASE) 50 MCG/ACT nasal spray SPRAY 2 SPRAYS INTO EACH NOSTRIL EVERY DAY 48 mL 1   Fluticasone-Umeclidin-Vilant (TRELEGY ELLIPTA) 200-62.5-25 MCG/ACT AEPB Inhale 1 puff into the lungs daily. 60 each 5   glucose blood (CONTOUR NEXT TEST) test strip Check blood sugar once daily 100 each 12   insulin glargine (LANTUS SOLOSTAR) 100 UNIT/ML Solostar Pen Inject 26 Units into the skin daily. 15 mL 5   insulin lispro (HUMALOG) 100 UNIT/ML injection Inject 0.03 mLs (3 Units total) into the skin 3 (three) times daily with meals. 100 mL 1   Insulin Pen Needle (B-D UF III MINI PEN NEEDLES) 31G X 5 MM MISC USE WITH LANTUS 100 each 3   Insulin Pen Needle 32G X 4 MM MISC Use with insulin 1 each 3   Insulin Syringe-Needle U-100 (B-D INS SYR ULTRAFINE 1CC/30G) 30G X 1/2" 1 ML MISC USE WITH LANTUS 200 each 1   Insulin Syringes, Disposable, U-100 1 ML MISC USE WITH LANTUS 200 each 2   JANUVIA 50 MG tablet TAKE 1 TABLET BY MOUTH EVERY DAY 30 tablet 3   meclizine (ANTIVERT) 12.5 MG tablet Take 1 tablet (12.5 mg total) by mouth 3 (three) times daily as needed for dizziness. 30 tablet 2   metFORMIN (GLUCOPHAGE) 1000 MG tablet TAKE 1 TABLET (1,000 MG TOTAL) BY MOUTH TWICE A DAY WITH FOOD 180 tablet 0  oxyCODONE-acetaminophen (PERCOCET/ROXICET) 5-325 MG tablet Take 1 tablet by  mouth every 4 (four) hours as needed for pain- frequency change 180 tablet 0   pantoprazole (PROTONIX) 40 MG tablet Take 1 tablet (40 mg total) by mouth 2 (two) times daily before a meal. 180 tablet 1   pregabalin (LYRICA) 75 MG capsule TAKE 1 CAPSULE BY MOUTH TWICE A DAY (Patient taking differently: Take 75 mg by mouth 2 (two) times daily.) 60 capsule 3   tamsulosin (FLOMAX) 0.4 MG CAPS capsule Take 2 capsules (0.8 mg total) by mouth daily. 180 capsule 1   tiZANidine (ZANAFLEX) 4 MG tablet Take 1 and 1/2 tablets (6 mg total) by mouth every 4 hours. 180 tablet 2   venlafaxine XR (EFFEXOR-XR) 150 MG 24 hr capsule Take 1 capsule (150 mg total) by mouth daily with breakfast. 90 capsule 0   warfarin (COUMADIN) 5 MG tablet TAKE 1 TO 2 TABLETS DAILY OR AS PRESCRIBED BY COUMADIN CLINIC 130 tablet 1   zolpidem (AMBIEN) 10 MG tablet Take 0.5-1 tablets (5-10 mg total) by mouth at bedtime as needed for sleep. 15 tablet 3   predniSONE (DELTASONE) 5 MG tablet 5 tab po day 1, 4 tab po day 2, 3 tab po day 4, 2 tab po day 5 and 1 tab po day 6. 15 tablet 0   No facility-administered medications prior to visit.    Allergies  Allergen Reactions   Daptomycin     Drug induced pneumonia    Diltiazem Other (See Comments)    Passed out   Ofev [Nintedanib] Diarrhea    fatigue   Pirfenidone Diarrhea    fatigue    Review of Systems  Constitutional:  Negative for fever and malaise/fatigue.  HENT:  Negative for congestion.   Eyes:  Negative for blurred vision.  Respiratory:  Negative for shortness of breath.   Cardiovascular:  Negative for chest pain, palpitations and leg swelling.  Gastrointestinal:  Negative for abdominal pain, blood in stool and nausea.  Genitourinary:  Negative for dysuria and frequency.  Musculoskeletal:  Negative for falls.  Skin:  Negative for rash.  Neurological:  Negative for dizziness, loss of consciousness and headaches.  Endo/Heme/Allergies:  Negative for environmental allergies.   Psychiatric/Behavioral:  Negative for depression. The patient is not nervous/anxious.        Objective:    Physical Exam Vitals reviewed.  Constitutional:      Appearance: Normal appearance. He is not ill-appearing.  HENT:     Head: Normocephalic and atraumatic.     Nose: Nose normal.  Eyes:     Conjunctiva/sclera: Conjunctivae normal.  Cardiovascular:     Rate and Rhythm: Normal rate.     Pulses: Normal pulses.     Heart sounds: Normal heart sounds. No murmur heard. Pulmonary:     Effort: Pulmonary effort is normal.     Breath sounds: Normal breath sounds. No wheezing.  Abdominal:     Palpations: Abdomen is soft. There is no mass.     Tenderness: There is no abdominal tenderness.  Musculoskeletal:     Cervical back: Normal range of motion.     Right lower leg: No edema.     Left lower leg: No edema.  Skin:    General: Skin is warm and dry.  Neurological:     General: No focal deficit present.     Mental Status: He is alert and oriented to person, place, and time.  Psychiatric:        Mood  and Affect: Mood normal.     BP 116/72 (BP Location: Left Arm, Patient Position: Sitting, Cuff Size: Normal)   Pulse 63   Temp (!) 97.5 F (36.4 C) (Oral)   Resp 16   Ht 5\' 10"  (1.778 m)   Wt 184 lb (83.5 kg)   SpO2 95%   BMI 26.40 kg/m  Wt Readings from Last 3 Encounters:  02/20/23 184 lb (83.5 kg)  02/02/23 184 lb 12.8 oz (83.8 kg)  01/07/23 183 lb (83 kg)    Diabetic Foot Exam - Simple   No data filed    Lab Results  Component Value Date   WBC 9.4 02/20/2023   HGB 13.1 02/20/2023   HCT 39.9 02/20/2023   PLT 370.0 02/20/2023   GLUCOSE 218 (H) 02/20/2023   CHOL 195 12/22/2022   TRIG 110.0 12/22/2022   HDL 97.20 12/22/2022   LDLDIRECT 64.0 10/30/2017   LDLCALC 76 12/22/2022   ALT 15 02/20/2023   AST 24 02/20/2023   NA 128 (L) 02/20/2023   K 5.0 02/20/2023   CL 91 (L) 02/20/2023   CREATININE 0.86 02/20/2023   BUN 16 02/20/2023   CO2 27 02/20/2023   TSH  1.62 12/22/2022   PSA 0.65 10/08/2019   INR 2.7 (H) 02/02/2023   HGBA1C 10.5 (H) 12/22/2022   MICROALBUR 2.5 (H) 08/23/2022    Lab Results  Component Value Date   TSH 1.62 12/22/2022   Lab Results  Component Value Date   WBC 9.4 02/20/2023   HGB 13.1 02/20/2023   HCT 39.9 02/20/2023   MCV 89.1 02/20/2023   PLT 370.0 02/20/2023   Lab Results  Component Value Date   NA 128 (L) 02/20/2023   K 5.0 02/20/2023   CO2 27 02/20/2023   GLUCOSE 218 (H) 02/20/2023   BUN 16 02/20/2023   CREATININE 0.86 02/20/2023   BILITOT 0.8 02/20/2023   ALKPHOS 88 02/20/2023   AST 24 02/20/2023   ALT 15 02/20/2023   PROT 6.1 02/20/2023   ALBUMIN 4.2 02/20/2023   CALCIUM 9.4 02/20/2023   ANIONGAP 12 01/07/2023   EGFR 92 01/25/2022   GFR 82.36 02/20/2023   Lab Results  Component Value Date   CHOL 195 12/22/2022   Lab Results  Component Value Date   HDL 97.20 12/22/2022   Lab Results  Component Value Date   LDLCALC 76 12/22/2022   Lab Results  Component Value Date   TRIG 110.0 12/22/2022   Lab Results  Component Value Date   CHOLHDL 2 12/22/2022   Lab Results  Component Value Date   HGBA1C 10.5 (H) 12/22/2022       Assessment & Plan:  Atrial fibrillation, unspecified type (HCC) Assessment & Plan: Rate controlled tolerating meds   Pulmonary emphysema, unspecified emphysema type (HCC) Assessment & Plan: No recent exacerbation   Essential hypertension Assessment & Plan: Well controlled, no changes to meds. Encouraged heart healthy diet such as the DASH diet and exercise as tolerated.     Familial multiple lipoprotein-type hyperlipidemia Assessment & Plan: Encouraged heart healthy diet, increase exercise, avoid trans fats, consider a krill oil cap daily tolerating Atorvastatin   Mixed hyperlipidemia Assessment & Plan: Encourage heart healthy diet such as MIND or DASH diet, increase exercise, avoid trans fats, simple carbohydrates and processed foods, consider a  krill or fish or flaxseed oil cap daily.     Hyponatremia -     Comprehensive metabolic panel  Leukocytosis, unspecified type -     CBC with Differential/Platelet  Danise Edge, MD

## 2023-02-21 NOTE — Telephone Encounter (Signed)
Medication already sent

## 2023-02-22 DIAGNOSIS — R0602 Shortness of breath: Secondary | ICD-10-CM | POA: Diagnosis not present

## 2023-02-22 DIAGNOSIS — J9601 Acute respiratory failure with hypoxia: Secondary | ICD-10-CM | POA: Diagnosis not present

## 2023-02-24 ENCOUNTER — Ambulatory Visit: Payer: Medicare Other

## 2023-02-24 ENCOUNTER — Encounter: Payer: Self-pay | Admitting: Internal Medicine

## 2023-02-24 DIAGNOSIS — Z7901 Long term (current) use of anticoagulants: Secondary | ICD-10-CM | POA: Diagnosis not present

## 2023-02-24 DIAGNOSIS — I480 Paroxysmal atrial fibrillation: Secondary | ICD-10-CM | POA: Diagnosis not present

## 2023-02-24 LAB — POCT INR: INR: 2.5 (ref 2.0–3.0)

## 2023-02-24 MED ORDER — PREDNISONE 5 MG PO TABS: 5.0000 mg | ORAL_TABLET | Freq: Every day | ORAL | 0 refills | Status: DC

## 2023-02-24 NOTE — Patient Instructions (Signed)
Continue taking warfarin 1 tablet daily except 1.5 tablets on Tuesdays and Saturdays.  Stay consistent with greens/protein drinks each week. Recheck INR in 6 weeks.  [facility]# [phone]

## 2023-02-24 NOTE — Telephone Encounter (Signed)
Per Office Visit from 02/02/2023- Continue prednisone  5mg  per day (once you complete current taper)

## 2023-03-09 DIAGNOSIS — M150 Primary generalized (osteo)arthritis: Secondary | ICD-10-CM | POA: Diagnosis not present

## 2023-03-09 DIAGNOSIS — Z79891 Long term (current) use of opiate analgesic: Secondary | ICD-10-CM | POA: Diagnosis not present

## 2023-03-09 DIAGNOSIS — G894 Chronic pain syndrome: Secondary | ICD-10-CM | POA: Diagnosis not present

## 2023-03-09 DIAGNOSIS — M961 Postlaminectomy syndrome, not elsewhere classified: Secondary | ICD-10-CM | POA: Diagnosis not present

## 2023-03-09 DIAGNOSIS — E1142 Type 2 diabetes mellitus with diabetic polyneuropathy: Secondary | ICD-10-CM | POA: Diagnosis not present

## 2023-03-14 ENCOUNTER — Encounter: Payer: Self-pay | Admitting: Internal Medicine

## 2023-03-16 ENCOUNTER — Other Ambulatory Visit: Payer: Self-pay

## 2023-03-16 ENCOUNTER — Encounter: Payer: Self-pay | Admitting: Internal Medicine

## 2023-03-16 ENCOUNTER — Other Ambulatory Visit: Payer: Self-pay | Admitting: Internal Medicine

## 2023-03-16 MED ORDER — ALBUTEROL SULFATE HFA 108 (90 BASE) MCG/ACT IN AERS: INHALATION_SPRAY | RESPIRATORY_TRACT | 5 refills | Status: DC

## 2023-03-16 MED ORDER — PREDNISONE 5 MG PO TABS: 5.0000 mg | ORAL_TABLET | Freq: Every day | ORAL | 0 refills | Status: DC

## 2023-03-20 ENCOUNTER — Encounter: Payer: Self-pay | Admitting: Family Medicine

## 2023-03-21 ENCOUNTER — Other Ambulatory Visit: Payer: Self-pay | Admitting: Family Medicine

## 2023-03-21 MED ORDER — ZOLPIDEM TARTRATE 10 MG PO TABS: 5.0000 mg | ORAL_TABLET | Freq: Every evening | ORAL | 3 refills | Status: DC | PRN

## 2023-03-24 ENCOUNTER — Ambulatory Visit (INDEPENDENT_AMBULATORY_CARE_PROVIDER_SITE_OTHER): Payer: Medicare Other | Admitting: Medical

## 2023-03-24 ENCOUNTER — Ambulatory Visit (HOSPITAL_BASED_OUTPATIENT_CLINIC_OR_DEPARTMENT_OTHER)
Admission: RE | Admit: 2023-03-24 | Discharge: 2023-03-24 | Disposition: A | Discharge status: Home / Self Care | Payer: Medicare Other | Source: Ambulatory Visit | Attending: Medical | Admitting: Medical

## 2023-03-24 VITALS — BP 160/66 | HR 100 | Temp 98.0°F | Resp 18 | Ht 70.0 in | Wt 193.0 lb

## 2023-03-24 DIAGNOSIS — M25512 Pain in left shoulder: Secondary | ICD-10-CM

## 2023-03-24 DIAGNOSIS — M25539 Pain in unspecified wrist: Secondary | ICD-10-CM

## 2023-03-24 DIAGNOSIS — M25511 Pain in right shoulder: Secondary | ICD-10-CM

## 2023-03-24 DIAGNOSIS — Z981 Arthrodesis status: Secondary | ICD-10-CM | POA: Diagnosis not present

## 2023-03-24 DIAGNOSIS — G8929 Other chronic pain: Secondary | ICD-10-CM | POA: Diagnosis not present

## 2023-03-24 DIAGNOSIS — R42 Dizziness and giddiness: Secondary | ICD-10-CM | POA: Diagnosis not present

## 2023-03-24 DIAGNOSIS — M19011 Primary osteoarthritis, right shoulder: Secondary | ICD-10-CM | POA: Diagnosis not present

## 2023-03-24 LAB — CBC WITH DIFFERENTIAL/PLATELET
Absolute Monocytes: 641 {cells}/uL (ref 200–950)
Basophils Absolute: 18 {cells}/uL (ref 0–200)
Basophils Relative: 0.2 %
Eosinophils Absolute: 62 {cells}/uL (ref 15–500)
Eosinophils Relative: 0.7 %
HCT: 39.7 % (ref 38.5–50.0)
Hemoglobin: 13.6 g/dL (ref 13.2–17.1)
Lymphs Abs: 151 {cells}/uL — ABNORMAL LOW (ref 850–3900)
MCH: 29.6 pg (ref 27.0–33.0)
MCHC: 34.3 g/dL (ref 32.0–36.0)
MCV: 86.5 fL (ref 80.0–100.0)
MPV: 8.8 fL (ref 7.5–12.5)
Monocytes Relative: 7.2 %
Neutro Abs: 8028 {cells}/uL — ABNORMAL HIGH (ref 1500–7800)
Neutrophils Relative %: 90.2 %
Platelets: 350 10*3/uL (ref 140–400)
RBC: 4.59 10*6/uL (ref 4.20–5.80)
RDW: 14.3 % (ref 11.0–15.0)
Total Lymphocyte: 1.7 %
WBC: 8.9 10*3/uL (ref 3.8–10.8)

## 2023-03-24 LAB — SEDIMENTATION RATE: Sed Rate: 9 mm/h (ref 0–20)

## 2023-03-24 MED ORDER — METHYLPREDNISOLONE 4 MG PO TABS: ORAL_TABLET | ORAL | 0 refills | Status: DC

## 2023-03-24 NOTE — Addendum Note (Signed)
Addended by: Mervin Kung A on: 03/24/2023 03:43 PM   Modules accepted: Orders

## 2023-03-24 NOTE — Progress Notes (Signed)
   Subjective:    [name], male    DOB: Sep 29, 1945, 77 y.o.   MRN: [id number]  HPI Pt in with some bilateral shoulder pain. Rt side is worse than left side.   Pt states currently on prednisone 5 mg rx'd by pulmonlogist. Known pulmonary fibrosis.  Also some bilateral wrist pain. Pt has been seen by rheumatologist but can't be seein until December.  Pt suggest that tapered prednisone may help as he has had in past and that currently pain in shoulder moderate despite using his pain medication oxycodone.   MPRESSION: 1. 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 There may be a small full-thickness nonretracted tear near the musculotendinous junction of the supraspinatus tendon. No significant muscular atrophy. 2. Complete rupture of the long head of the biceps tendon. 3. No acute findings. Mild glenohumeral and mild-to-moderate acromioclavicular degenerative changes. Fluid in the subacromial-subdeltoid and subcoracoid bursa. 4. The labrum appears intact.   Both shoulder pain and wrist pain occurring for 2 months.  Intermittent mild dizziness in morning. No motor or sensory deficits reported. Dizziness is not new. He is on meclizine. Helps some. No cardiac symptoms. No palpitations reported.  Pt sugars have been 200. This morning 160.  Pt is a diabetic and he is on lantus 26 units at night. Also has slidig scale humalog. Pt is on metformin and januvia as well.  Pt gfr 82 on last labs. One month ago sodium was low.   For gerd pt is on protonix.    Review of Systems See hpi    Objective:   Physical Exam  General Mental Status- Alert. General Appearance- Not in acute distress.   Skin General: Color- Normal Color. Moisture- Normal Moisture.  Neck Carotid Arteries- Normal color. Moisture- Normal Moisture. No carotid bruits. No JVD.  Chest and Lung  Exam Auscultation: Breath Sounds:-Normal.  Cardiovascular Auscultation:Rythm- Regular. Murmurs & Other Heart Sounds:Auscultation of the heart reveals- No Murmurs.  Abdomen Inspection:-Inspeection Normal. Palpation/Percussion:Note:No mass. Palpation and Percussion of the abdomen reveal- Non Tender, Non Distended + BS, no rebound or guarding.   Neurologic Cranial Nerve exam:- CN III-XII intact(No nystagmus), symmetric smile. Strength:- 5/5 equal and symmetric strength both upper and lower extremities.   Rt shoulder- mild pain on rom. Left shoulder- good rom. Minimal pan. Wrist- not swollen.     Assessment & Plan:   Patient Instructions  1. Right shoulder pain, unspecified chronicity, Chronic left shoulder pain and  Pain in wrist, bilateral.  - DG Shoulder Right -sed rate as that was elevaed early in summer. - keep rheuamtology appt in December. - taper medrol dose for 4 days. Hold 5 mg prednisone until done with 4 day taper medrol.. -eat low sugar diet. Continue lantus and humalog. Titrate humalog as sliding scale shows. Continue lantus current dose.  - if any sugar over 350 notify us. If any over 400 then ED evaluation.   Dizziness -chronic intermittent dizziness in past. Norma neurologic exam presently. Check below labs. If any emergent values will advise ED. If any neuro or cardiac symptoms with dizzines be seen in ED. Continue meclizine as needed. - Comp Met (CMET) - CBC w/Diff   Follow up date to be determined after lab review   Esperanza Richters, PA-C

## 2023-03-24 NOTE — Patient Instructions (Addendum)
1. Right shoulder pain, unspecified chronicity, Chronic left shoulder pain and  Pain in wrist, bilateral.  - DG Shoulder Right -sed rate as that was elevaed early in summer. - keep rheumtology appt in December. - taper medrol dose for 4 days. Hold 5 mg prednisone until done with 4 day taper medrol.. -eat low sugar diet. Continue lantus and humalog. Titrate humalog as sliding scale shows. Continue lantus current dose.  - if any sugar over 350 notify us. If any over 400 then ED evaluation.   Dizziness -chronic intermittent dizziness in past. Norma neurologic exam presently. Check below labs. If any emergent values will advise ED. If any neuro or cardiac symptoms with dizziness be seen in ED. Continue meclizine as needed. - Comp Met (CMET) - CBC w/Diff   Follow up date to be determined after lab review

## 2023-03-25 ENCOUNTER — Encounter: Payer: Self-pay | Admitting: Family Medicine

## 2023-03-25 ENCOUNTER — Encounter: Payer: Self-pay | Admitting: Internal Medicine

## 2023-03-25 LAB — COMPREHENSIVE METABOLIC PANEL
AG Ratio: 2.5 (calc) (ref 1.0–2.5)
ALT: 17 U/L (ref 9–46)
AST: 24 U/L (ref 10–35)
Albumin: 4.5 g/dL (ref 3.6–5.1)
Alkaline phosphatase (APISO): 98 U/L (ref 35–144)
BUN/Creatinine Ratio: 23 (calc) — ABNORMAL HIGH (ref 6–22)
BUN: 14 mg/dL (ref 7–25)
CO2: 27 mmol/L (ref 20–32)
Calcium: 9.6 mg/dL (ref 8.6–10.3)
Chloride: 94 mmol/L — ABNORMAL LOW (ref 98–110)
Creat: 0.62 mg/dL — ABNORMAL LOW (ref 0.70–1.28)
Globulin: 1.8 g/dL — ABNORMAL LOW (ref 1.9–3.7)
Glucose, Bld: 199 mg/dL — ABNORMAL HIGH (ref 65–99)
Potassium: 4.3 mmol/L (ref 3.5–5.3)
Sodium: 132 mmol/L — ABNORMAL LOW (ref 135–146)
Total Bilirubin: 0.7 mg/dL (ref 0.2–1.2)
Total Protein: 6.3 g/dL (ref 6.1–8.1)

## 2023-03-27 MED ORDER — TRELEGY ELLIPTA 200-62.5-25 MCG/ACT IN AEPB: 1.0000 | INHALATION_SPRAY | Freq: Every day | RESPIRATORY_TRACT | 5 refills | Status: DC

## 2023-03-27 MED ORDER — SITAGLIPTIN PHOSPHATE 50 MG PO TABS: 50.0000 mg | ORAL_TABLET | Freq: Every day | ORAL | 1 refills | Status: DC

## 2023-04-03 ENCOUNTER — Encounter: Payer: Self-pay | Admitting: Family Medicine

## 2023-04-04 ENCOUNTER — Other Ambulatory Visit: Payer: Self-pay | Admitting: Family Medicine

## 2023-04-07 ENCOUNTER — Ambulatory Visit: Payer: Medicare Other | Attending: Cardiovascular Disease

## 2023-04-07 DIAGNOSIS — I480 Paroxysmal atrial fibrillation: Secondary | ICD-10-CM | POA: Diagnosis not present

## 2023-04-07 DIAGNOSIS — Z7901 Long term (current) use of anticoagulants: Secondary | ICD-10-CM

## 2023-04-07 LAB — POCT INR: INR: 2.3 (ref 2.0–3.0)

## 2023-04-07 NOTE — Patient Instructions (Signed)
Continue taking warfarin 1 tablet daily except 1.5 tablets on Tuesdays and Saturdays.  Stay consistent with greens/protein drinks each week. Recheck INR in 6 weeks.  [facility]# [phone]

## 2023-04-13 ENCOUNTER — Other Ambulatory Visit: Payer: Self-pay

## 2023-04-13 ENCOUNTER — Encounter: Payer: Self-pay | Admitting: Family Medicine

## 2023-04-13 MED ORDER — MECLIZINE HCL 12.5 MG PO TABS: 12.5000 mg | ORAL_TABLET | Freq: Three times a day (TID) | ORAL | 2 refills | Status: DC | PRN

## 2023-04-13 NOTE — Telephone Encounter (Signed)
Refill sent.

## 2023-04-17 ENCOUNTER — Encounter: Payer: Self-pay | Admitting: Family Medicine

## 2023-04-19 ENCOUNTER — Encounter: Payer: Self-pay | Admitting: Family Medicine

## 2023-04-25 ENCOUNTER — Encounter: Payer: Self-pay | Admitting: Family Medicine

## 2023-04-25 ENCOUNTER — Encounter: Payer: Self-pay | Admitting: Internal Medicine

## 2023-04-25 ENCOUNTER — Other Ambulatory Visit: Payer: Self-pay | Admitting: Internal Medicine

## 2023-04-25 MED ORDER — PREDNISONE 5 MG PO TABS: 5.0000 mg | ORAL_TABLET | Freq: Every day | ORAL | 1 refills | Status: DC

## 2023-04-26 ENCOUNTER — Other Ambulatory Visit: Payer: Self-pay

## 2023-04-26 MED ORDER — BUSPIRONE HCL 15 MG PO TABS: 15.0000 mg | ORAL_TABLET | Freq: Three times a day (TID) | ORAL | 1 refills | Status: DC

## 2023-05-04 ENCOUNTER — Encounter: Payer: Self-pay | Admitting: Family Medicine

## 2023-05-09 DIAGNOSIS — E1142 Type 2 diabetes mellitus with diabetic polyneuropathy: Secondary | ICD-10-CM | POA: Diagnosis not present

## 2023-05-09 DIAGNOSIS — M961 Postlaminectomy syndrome, not elsewhere classified: Secondary | ICD-10-CM | POA: Diagnosis not present

## 2023-05-09 DIAGNOSIS — M150 Primary generalized (osteo)arthritis: Secondary | ICD-10-CM | POA: Diagnosis not present

## 2023-05-09 DIAGNOSIS — G894 Chronic pain syndrome: Secondary | ICD-10-CM | POA: Diagnosis not present

## 2023-05-19 ENCOUNTER — Ambulatory Visit (INDEPENDENT_AMBULATORY_CARE_PROVIDER_SITE_OTHER): Payer: Medicare Other | Admitting: Medical

## 2023-05-19 ENCOUNTER — Ambulatory Visit: Payer: Medicare Other

## 2023-05-19 ENCOUNTER — Ambulatory Visit (HOSPITAL_BASED_OUTPATIENT_CLINIC_OR_DEPARTMENT_OTHER)
Admission: RE | Admit: 2023-05-19 | Discharge: 2023-05-19 | Disposition: A | Discharge status: Home / Self Care | Payer: Medicare Other | Source: Ambulatory Visit | Attending: Medical | Admitting: Medical

## 2023-05-19 VITALS — BP 100/52 | HR 62 | Resp 20 | Ht 70.0 in | Wt 196.6 lb

## 2023-05-19 DIAGNOSIS — K8020 Calculus of gallbladder without cholecystitis without obstruction: Secondary | ICD-10-CM

## 2023-05-19 DIAGNOSIS — M4624 Osteomyelitis of vertebra, thoracic region: Secondary | ICD-10-CM | POA: Diagnosis not present

## 2023-05-19 DIAGNOSIS — E1165 Type 2 diabetes mellitus with hyperglycemia: Secondary | ICD-10-CM

## 2023-05-19 DIAGNOSIS — R0781 Pleurodynia: Secondary | ICD-10-CM

## 2023-05-19 DIAGNOSIS — R5383 Other fatigue: Secondary | ICD-10-CM | POA: Diagnosis not present

## 2023-05-19 DIAGNOSIS — R6883 Chills (without fever): Secondary | ICD-10-CM

## 2023-05-19 LAB — COMPREHENSIVE METABOLIC PANEL
ALT: 14 U/L (ref 0–53)
AST: 20 U/L (ref 0–37)
Albumin: 4 g/dL (ref 3.5–5.2)
Alkaline Phosphatase: 108 U/L (ref 39–117)
BUN: 11 mg/dL (ref 6–23)
CO2: 27 meq/L (ref 19–32)
Calcium: 9.1 mg/dL (ref 8.4–10.5)
Chloride: 95 meq/L — ABNORMAL LOW (ref 96–112)
Creatinine, Ser: 0.71 mg/dL (ref 0.40–1.50)
GFR: 87.13 mL/min (ref >60.00)
Glucose, Bld: 216 mg/dL — ABNORMAL HIGH (ref 70–99)
Potassium: 5 meq/L (ref 3.5–5.1)
Sodium: 129 meq/L — ABNORMAL LOW (ref 135–145)
Total Bilirubin: 0.7 mg/dL (ref 0.2–1.2)
Total Protein: 5.8 g/dL — ABNORMAL LOW (ref 6.0–8.3)

## 2023-05-19 LAB — CBC WITH DIFFERENTIAL/PLATELET
Basophils Absolute: 0.1 10*3/uL (ref 0.0–0.1)
Basophils Relative: 0.6 % (ref 0.0–3.0)
Eosinophils Absolute: 0.1 10*3/uL (ref 0.0–0.7)
Eosinophils Relative: 1.4 % (ref 0.0–5.0)
HCT: 39.9 % (ref 39.0–52.0)
Hemoglobin: 13 g/dL (ref 13.0–17.0)
Lymphocytes Relative: 1.4 % — ABNORMAL LOW (ref 12.0–46.0)
Lymphs Abs: 0.1 10*3/uL — ABNORMAL LOW (ref 0.7–4.0)
MCHC: 32.5 g/dL (ref 30.0–36.0)
MCV: 90.4 fL (ref 78.0–100.0)
Monocytes Absolute: 0.9 10*3/uL (ref 0.1–1.0)
Monocytes Relative: 11 % (ref 3.0–12.0)
Neutro Abs: 7 10*3/uL (ref 1.4–7.7)
Neutrophils Relative %: 85.6 % — ABNORMAL HIGH (ref 43.0–77.0)
Platelets: 396 10*3/uL (ref 150.0–400.0)
RBC: 4.41 Mil/uL (ref 4.22–5.81)
RDW: 15.4 % (ref 11.5–15.5)
WBC: 8.2 10*3/uL (ref 4.0–10.5)

## 2023-05-19 LAB — SEDIMENTATION RATE: Sed Rate: 4 mm/h (ref 0–20)

## 2023-05-19 LAB — TSH: TSH: 2.49 u[IU]/mL (ref 0.35–5.50)

## 2023-05-19 LAB — IRON: Iron: 72 ug/dL (ref 42–165)

## 2023-05-19 LAB — HEMOGLOBIN A1C: Hgb A1c MFr Bld: 8.8 % — ABNORMAL HIGH (ref 4.6–6.5)

## 2023-05-19 LAB — VITAMIN B12: Vitamin B-12: 525 pg/mL (ref 211–911)

## 2023-05-19 LAB — C-REACTIVE PROTEIN: CRP: 1.4 mg/dL (ref 0.5–20.0)

## 2023-05-19 MED ORDER — FAMCICLOVIR 500 MG PO TABS: 500.0000 mg | ORAL_TABLET | Freq: Three times a day (TID) | ORAL | 0 refills | Status: DC

## 2023-05-19 NOTE — Patient Instructions (Addendum)
1. Type 2 diabetes mellitus with hyperglycemia, without long-term current use of insulin (HCC) -will follow A1c and see if you need adjustment of current regimen - Hemoglobin A1c  2. 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HCC) -follow cbc, sed rate and c reactive.   3. Gallstones Follow cbc and cmp.  4. Rib pain on right side -will get xray to evaluate if rib fracture or if possible pneumonia secondary to injury and poor inspiration - DG Ribs Unilateral W/Chest Right; Future  5. Chills - CBC w/Diff - Sedimentation rate - C-reactive protein  6. Fatigue, unspecified type  - CBC w/Diff - Comp Met (CMET) - Iron - B12 - Vitamin B1 - TSH   Also left lower lib possible cold sore. Rx famvir.  Follow up date to be determined after lab and imaging review.

## 2023-05-19 NOTE — Progress Notes (Signed)
Subjective:    [name], male    DOB: 01/22/1946, 77 y.o.   MRN: [id number]  HPI Pt in for follow up.  Pt states he feels very fatigued. Told by specialist not to take testosterone any more.  Patient has been severely fatigue over past months. More fatigue over past week. He had some chills but no sweats. No fever.    Pt states he fell 2 weeks ago. He landed on his rt rib area. Pt states he could brace himself. He landed with his rt arm extended and states rib pain. He did not get bruising. Pt states when he placed biofreeze patch the area feels better.  Pt is on percocet 5-325  but will occasianally have break thru pain.    Pt had shoulder pain in past and last xray did show below. Pt states his rt shoulder pain resolved. IMPRESSION: 1. 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 2. There appears to be new fracturing of the bilateral rods just above the bilateral transpedicular screws. This appears to be new from 07/13/2022 CT but unchanged from 10/20/2022 chest radiographs.  Also in past chronic back pain related to spinal sx. He has hxof subacture osteomyelits.  Pt did see general surgeon for known gallstones. Pt visit with surgeon showed below. Assessment and Plan:  Diagnoses and all orders for this visit:  Calculus of gallbladder without cholecystitis without obstruction   Luis Brown is a 77 y.o. male   Pt with asx gallstones at this time. I d/w him the signs of sxs of gallbladder pain. If he were to start experiencing this we can discsus surgery at that time. PT OK to f/u PRN  Return if symptoms worsen or fail to improve.    Pt tells me no pain after he eats in ruq. No nausea or vomiting.   Review of Systems  Constitutional:  Positive for chills and fatigue.  HENT:  Negative for congestion.   Respiratory:  Positive for cough and shortness of breath. Negative for choking and wheezing.        Chronic daily to some  degree. Sees pulmonologsit.  Cardiovascular:  Negative for chest pain and palpitations.  Gastrointestinal:  Negative for abdominal pain.  Genitourinary:  Negative for dysuria.  Musculoskeletal:  Negative for back pain.  Skin:  Negative for rash.  Neurological:  Negative for dizziness and seizures.  Hematological:  Negative for adenopathy. Does not bruise/bleed easily.  Psychiatric/Behavioral:  Negative for behavioral problems and decreased concentration.     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   Esophageal reflux 02/10/2015   Fatigue    HTN (hypertension)    Hyperglycemia    preDM    Hyperlipidemia    Insomnia    Low back pain radiating to both legs 01/16/2017   Measles as a child   Overweight(278.02)    Peripheral neuropathy 08/07/2019   Personal history of colonic polyps 10/27/2012   Follows with [facility]  Pneumonia    " walking"   Preventative health care    Testosterone deficiency 05/23/2012   Wears glasses      Social History   Socioeconomic History   Marital status: Married    Spouse name: Not on file   Number of children: Not on file   Years of education: Not on file   Highest education level: 12th grade  Occupational History   Not on file  Tobacco Use   Smoking status: Former    Current packs/day: 0.00    Average packs/day: 1 pack/day for 20.0 years (20.0 ttl pk-yrs)    Types: Cigarettes    Start date: 07/18/1969    Quit date: 07/18/1989    Years since quitting: 33.8   Smokeless tobacco: Never   Tobacco  comments:    Former smoker 03/16/22  Vaping Use   Vaping status: Never Used  Substance and Sexual Activity   Alcohol use: Yes    Alcohol/week: 2.0 standard drinks of alcohol    Types: 2 Standard drinks or equivalent per week    Comment: couple beers here and there 03/16/22   Drug use: No   Sexual activity: Yes    Comment: lives with wife, still working, no dietary restrictions, continues to exercise intermittently  Other Topics Concern   Not on file  Social History Narrative   Not on file   Social Determinants of Health   Financial Resource Strain: Low Risk  (01/13/2023)   Overall Financial Resource Strain (CARDIA)    Difficulty of Paying Living Expenses: Not hard at all  Food Insecurity: No Food Insecurity (01/13/2023)   Hunger Vital Sign    Worried About Running Out of Food in the Last Year: Never true    Ran Out of Food in the Last Year: Never true  Transportation Needs: No Transportation Needs (01/13/2023)   PRAPARE - Administrator, Civil Service (Medical): No    Lack of Transportation (Non-Medical): No  Physical Activity: Inactive (01/13/2023)   Exercise Vital Sign    Days of Exercise per Week: 0 days    Minutes of Exercise per Session: 0 min  Stress: Stress Concern Present (01/13/2023)   [employer] of Occupational Health - Occupational Stress Questionnaire    Feeling of Stress : To some extent  Social Connections: Socially Integrated (01/13/2023)   Social Connection and Isolation Panel [NHANES]    Frequency of Communication with Friends and Family: Three times a week    Frequency of Social Gatherings with Friends and Family: Patient declined    Attends Religious Services: More than 4 times per year    Active Member of [employer] or Organizations: Yes    Attends Banker Meetings: More than 4 times per year    Marital Status: Married  Catering manager Violence: Not At Risk (01/13/2023)   Humiliation, Afraid, Rape, and Kick questionnaire    Fear  of Current or Ex-Partner: No    Emotionally Abused: No    Physically Abused: No    Sexually Abused: No    Past Surgical History:  Procedure Laterality Date   ATRIAL FIBRILLATION ABLATION N/A 01/27/2022   Procedure: ATRIAL FIBRILLATION ABLATION;  Surgeon: Regan Lemming, MD;  Location: [facility];  Service: Cardiovascular;  Laterality: N/A;   BACK SURGERY  2012 and 1994   Dr Charlesetta Garibaldi, screws and cage in low back   BIOPSY  01/09/2020   Procedure: BIOPSY;  Surgeon: Kathi Der, MD;  Location: [facility];  Service: Gastroenterology;;   BRONCHIAL BIOPSY  11/17/2020   Procedure: BRONCHIAL BIOPSIES;  Surgeon: Tomma Lightning, MD;  Location: [facility];  Service: Endoscopy;;   BRONCHIAL BRUSHINGS  11/17/2020   Procedure: BRONCHIAL BRUSHINGS;  Surgeon: Tomma Lightning, MD;  Location: [facility];  Service: Endoscopy;;   BRONCHIAL WASHINGS  11/17/2020   Procedure: BRONCHIAL WASHINGS;  Surgeon: Tomma Lightning, MD;  Location: [facility];  Service: Endoscopy;;   COLONOSCOPY WITH PROPOFOL N/A 01/09/2020   Procedure: COLONOSCOPY WITH PROPOFOL;  Surgeon: Kathi Der, MD;  Location: [facility];  Service: Gastroenterology;  Laterality: N/A;   ESOPHAGOGASTRODUODENOSCOPY (EGD) WITH PROPOFOL N/A 01/09/2020   Procedure: ESOPHAGOGASTRODUODENOSCOPY (EGD) WITH PROPOFOL;  Surgeon: Kathi Der, MD;  Location: [facility];  Service: Gastroenterology;  Laterality: N/A;   EYE SURGERY Bilateral    2016   HARDWARE REVISION  03/12/2019   REVISION OF HARDWARE THORACIC TEN-THORACIC ELEVEN WITH APPLICATION OF ADDITIONAL RODS AND ROD SLEEVES THORACIC EIGHT-THORACIC TWELVE 12-LUMBAR ONE (N/A )   HEMOSTASIS CONTROL  11/17/2020   Procedure: HEMOSTASIS CONTROL;  Surgeon: Tomma Lightning, MD;  Location: [facility];  Service: Endoscopy;;   IR US GUIDE BX ASP/DRAIN  11/13/2020   LAMINECTOMY  02/12/2019   DECOMPRESSIVE LAMINECTOMY THORACIC NINE-THORACIC TEN AND THORACIC TEN-THORACIC  ELEVEN, EXTENSION OF THORACOLUMBAR FUSION FROM THORACIC TEN TO THORACIC FIVE, LOCAL BONE GRAFT, ALLOGRAFT AND VIVIGEN (N/A)   POLYPECTOMY  01/09/2020   Procedure: POLYPECTOMY;  Surgeon: Kathi Der, MD;  Location: [facility];  Service: Gastroenterology;;   REMOVAL OF RODS AND PEDICLE SCREWS T5 AND T6, EXPLORATION OF FUSION, BIOPSY TRANSPEDICULAR T5 AND T6 (N/A )  09/14/2020   SPINAL FUSION N/A 02/12/2019   Procedure: DECOMPRESSIVE LAMINECTOMY THORACIC NINE-THORACIC TEN AND THORACIC TEN-THORACIC ELEVEN, EXTENSION OF THORACOLUMBAR FUSION FROM THORACIC TEN TO THORACIC FIVE, LOCAL BONE GRAFT, ALLOGRAFT AND VIVIGEN;  Surgeon: Kerrin Champagne, MD;  Location: [facility];  Service: Orthopedics;  Laterality: N/A;  DECOMPRESSIVE LAMINECTOMY THORACIC NINE-THORACIC TEN AND THORACIC TEN-THORACIC ELEVEN, EXTENSION OF TH   SPINAL FUSION N/A 03/12/2019   Procedure: REVISION OF HARDWARE THORACIC TEN-THORACIC ELEVEN WITH APPLICATION OF ADDITIONAL RODS AND ROD SLEEVES THORACIC EIGHT-THORACIC TWELVE 12-LUMBAR ONE;  Surgeon: Kerrin Champagne, MD;  Location: [facility];  Service: Orthopedics;  Laterality: N/A;   TONSILLECTOMY     as child   torn rotator cuff  2010   right   VIDEO BRONCHOSCOPY N/A 11/17/2020   Procedure: VIDEO BRONCHOSCOPY WITH FLUORO;  Surgeon: Tomma Lightning, MD;  Location: [facility];  Service: Endoscopy;  Laterality: N/A;    Family History  Problem Relation Age of Onset   Hypertension Mother    Diabetes Mother        type 2   Cancer Mother 83       breast in remission   Emphysema Father        smoker   COPD Father        smoker   Stroke Father 60       mini   Heart disease Father    Hypertension Sister    Hyperlipidemia Sister    Scoliosis Sister    Osteoporosis Sister    Hypertension Maternal Grandmother    Scoliosis Maternal Grandmother    Heart disease Maternal Grandfather    Heart disease Paternal Grandfather        smoker   Heart disease Daughter     Allergies  Allergen  Reactions   Daptomycin     Drug induced pneumonia  Diltiazem Other (See Comments)    Passed out   Ofev [Nintedanib] Diarrhea    fatigue   Pirfenidone Diarrhea    fatigue    Current Outpatient Medications on File Prior to Visit  Medication Sig Dispense Refill   albuterol (PROVENTIL HFA) 108 (90 Base) MCG/ACT inhaler TAKE 2 PUFFS BY MOUTH EVERY 6 HOURS AS NEEDED FOR WHEEZE OR SHORTNESS OF BREATH 8 g 5   atorvastatin (LIPITOR) 10 MG tablet TAKE 1 TABLET BY MOUTH EVERY DAY 90 tablet 1   busPIRone (BUSPAR) 15 MG tablet Take 1 tablet (15 mg total) by mouth 3 (three) times daily. 270 tablet 1   dextromethorphan-guaiFENesin (MUCINEX DM) 30-600 MG 12hr tablet Take 1 tablet by mouth 2 (two) times daily.     fluticasone (FLONASE) 50 MCG/ACT nasal spray SPRAY 2 SPRAYS INTO EACH NOSTRIL EVERY DAY 48 mL 1   Fluticasone-Umeclidin-Vilant (TRELEGY ELLIPTA) 200-62.5-25 MCG/ACT AEPB Inhale 1 puff into the lungs daily. 60 each 5   glucose blood (CONTOUR NEXT TEST) test strip Check blood sugar once daily 100 each 12   insulin glargine (LANTUS SOLOSTAR) 100 UNIT/ML Solostar Pen Inject 26 Units into the skin daily. 15 mL 5   insulin lispro (HUMALOG) 100 UNIT/ML injection Inject 0.03 mLs (3 Units total) into the skin 3 (three) times daily with meals. 100 mL 1   Insulin Pen Needle (B-D UF III MINI PEN NEEDLES) 31G X 5 MM MISC USE WITH LANTUS 100 each 3   Insulin Pen Needle 32G X 4 MM MISC Use with insulin 1 each 3   Insulin Syringe-Needle U-100 (B-D INS SYR ULTRAFINE 1CC/30G) 30G X 1/2" 1 ML MISC USE WITH LANTUS 200 each 1   Insulin Syringes, Disposable, U-100 1 ML MISC USE WITH LANTUS 200 each 2   meclizine (ANTIVERT) 12.5 MG tablet Take 1 tablet (12.5 mg total) by mouth 3 (three) times daily as needed for dizziness. 30 tablet 2   metFORMIN (GLUCOPHAGE) 1000 MG tablet TAKE 1 TABLET (1,000 MG TOTAL) BY MOUTH TWICE A DAY WITH FOOD 180 tablet 0   methylPREDNISolone (MEDROL) 4 MG tablet 4 tab po day 1, 3 tab po  day 2, 2 tab po day 3 and 1 tab po day 4 16 tablet 0   oxyCODONE-acetaminophen (PERCOCET/ROXICET) 5-325 MG tablet Take 1 tablet by mouth every 4 (four) hours as needed for pain- frequency change 180 tablet 0   pantoprazole (PROTONIX) 40 MG tablet Take 1 tablet (40 mg total) by mouth 2 (two) times daily before a meal. 180 tablet 1   predniSONE (DELTASONE) 5 MG tablet TAKE 1 TABLET BY MOUTH EVERY DAY 30 tablet 0   predniSONE (DELTASONE) 5 MG tablet Take 1 tablet (5 mg total) by mouth daily. 30 tablet 1   pregabalin (LYRICA) 75 MG capsule TAKE 1 CAPSULE BY MOUTH TWICE A DAY (Patient taking differently: Take 75 mg by mouth 2 (two) times daily.) 60 capsule 3   sitaGLIPtin (JANUVIA) 50 MG tablet Take 1 tablet (50 mg total) by mouth daily. 90 tablet 1   tamsulosin (FLOMAX) 0.4 MG CAPS capsule Take 2 capsules (0.8 mg total) by mouth daily. 180 capsule 1   tiZANidine (ZANAFLEX) 4 MG tablet Take 1 and 1/2 tablets (6 mg total) by mouth every 4 hours. 180 tablet 2   venlafaxine XR (EFFEXOR-XR) 150 MG 24 hr capsule Take 1 capsule (150 mg total) by mouth daily with breakfast. 90 capsule 0   warfarin (COUMADIN) 5 MG tablet TAKE 1 TO 2 TABLETS  DAILY OR AS PRESCRIBED BY COUMADIN CLINIC 130 tablet 1   zolpidem (AMBIEN) 10 MG tablet Take 0.5-1 tablets (5-10 mg total) by mouth at bedtime as needed for sleep. 15 tablet 3   No current facility-administered medications on file prior to visit.    BP (!) 100/52   Pulse 62   Resp 20   Ht 5\' 10"  (1.778 m)   Wt 196 lb 9.6 oz (89.2 kg)   SpO2 97%   BMI 28.21 kg/m        Objective:   Physical Exam  General Mental Status- Alert. General Appearance- Not in acute distress.   Skin General: Color- Normal Color. Moisture- Normal Moisture.  Neck  No JVD.  Chest and Lung Exam Auscultation: Breath Sounds: mild shallow breathing but even unlabored. I thought faint rough breath sounds base of lungs left side.  Cardiovascular Auscultation:Rythm- rrr Murmurs &  Other Heart Sounds:Auscultation of the heart reveals- No Murmurs.  Abdomen Inspection:-Inspeection Normal. Palpation/Percussion:Note:No mass. Palpation and Percussion of the abdomen reveal- Non Tender, Non Distended + BS, no rebound or guarding.   Neurologic Cranial Nerve exam:- CN III-XII intact(No nystagmus), symmetric smile. Strength:- 5/5 equal and symmetric strength both upper and lower extremities.   Lower ext- calves symmetric. No pedal edema. Negative hoamns sign  Rt ribs- lower ribs no crepitus. No brusiing. Faint tender under rt pectoralis.      Assessment & Plan:   Patient Instructions  1. Type 2 diabetes mellitus with hyperglycemia, without long-term current use of insulin (HCC) -will follow A1c and see if you need adjustment of current regimen - Hemoglobin A1c  2. 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HCC) -follow cbc, sed rate and c reactive.   3. Gallstones Follow cbc and cmp.  4. Rib pain on right side -will get xray to evaluate if rib fracture or if possible pneumonia secondary to injury and poor inspiration - DG Ribs Unilateral W/Chest Right; Future  5. Chills - CBC w/Diff - Sedimentation rate - C-reactive protein  6. Fatigue, unspecified type  - CBC w/Diff - Comp Met (CMET) - Iron - B12 - Vitamin B1 - TSH   Follow up date to be determined after lab and imaging review.   Esperanza Richters, PA-C

## 2023-05-19 NOTE — Addendum Note (Signed)
Addended by: Gwenevere Abbot on: 05/19/2023 01:33 PM   Modules accepted: Orders

## 2023-05-21 ENCOUNTER — Encounter: Payer: Self-pay | Admitting: Family Medicine

## 2023-05-22 MED ORDER — ZOLPIDEM TARTRATE 10 MG PO TABS: 5.0000 mg | ORAL_TABLET | Freq: Every evening | ORAL | 3 refills | Status: DC | PRN

## 2023-05-24 LAB — VITAMIN B1: Vitamin B1 (Thiamine): 66 nmol/L — ABNORMAL HIGH (ref 8–30)

## 2023-05-24 NOTE — Assessment & Plan Note (Signed)
Rate controlled tolerating meds 

## 2023-05-24 NOTE — Assessment & Plan Note (Signed)
Well controlled, no changes to meds. Encouraged heart healthy diet such as the DASH diet and exercise as tolerated.  °

## 2023-05-24 NOTE — Assessment & Plan Note (Signed)
Following with pulmonology and cardiology

## 2023-05-24 NOTE — Assessment & Plan Note (Signed)
hgba1c acceptable, minimize simple carbs. Increase exercise as tolerated. Continue current meds 

## 2023-05-24 NOTE — Assessment & Plan Note (Signed)
Follows with cardiology 

## 2023-05-25 ENCOUNTER — Other Ambulatory Visit (HOSPITAL_BASED_OUTPATIENT_CLINIC_OR_DEPARTMENT_OTHER): Payer: Self-pay

## 2023-05-25 ENCOUNTER — Ambulatory Visit (INDEPENDENT_AMBULATORY_CARE_PROVIDER_SITE_OTHER): Payer: Medicare Other | Admitting: Family Medicine

## 2023-05-25 ENCOUNTER — Encounter: Payer: Self-pay | Admitting: Family Medicine

## 2023-05-25 VITALS — BP 122/60 | HR 78 | Temp 97.9°F | Resp 18 | Ht 70.0 in | Wt 198.4 lb

## 2023-05-25 DIAGNOSIS — J84112 Idiopathic pulmonary fibrosis: Secondary | ICD-10-CM

## 2023-05-25 DIAGNOSIS — E0865 Diabetes mellitus due to underlying condition with hyperglycemia: Secondary | ICD-10-CM

## 2023-05-25 DIAGNOSIS — J9611 Chronic respiratory failure with hypoxia: Secondary | ICD-10-CM | POA: Diagnosis not present

## 2023-05-25 DIAGNOSIS — I4891 Unspecified atrial fibrillation: Secondary | ICD-10-CM | POA: Diagnosis not present

## 2023-05-25 DIAGNOSIS — I10 Essential (primary) hypertension: Secondary | ICD-10-CM | POA: Diagnosis not present

## 2023-05-25 DIAGNOSIS — Z794 Long term (current) use of insulin: Secondary | ICD-10-CM | POA: Diagnosis not present

## 2023-05-25 MED ORDER — TIRZEPATIDE 2.5 MG/0.5ML ~~LOC~~ SOAJ: 2.5000 mg | SUBCUTANEOUS | 1 refills | Status: DC

## 2023-05-25 MED ORDER — COVID-19 MRNA VAC-TRIS(PFIZER) 30 MCG/0.3ML IM SUSY
0.3000 mL | PREFILLED_SYRINGE | Freq: Once | INTRAMUSCULAR | 0 refills | Status: AC
  Filled 2023-05-25: qty 0.3, 1d supply, fill #0

## 2023-05-25 MED ORDER — ZOSTER VAC RECOMB ADJUVANTED 50 MCG/0.5ML IM SUSR
0.5000 mL | Freq: Once | INTRAMUSCULAR | 0 refills | Status: AC
  Filled 2023-05-25: qty 0.5, 1d supply, fill #0

## 2023-05-25 NOTE — Patient Instructions (Addendum)
Shingrix is the new shingles shot, 2 shots over 2-6 months, confirm coverage with insurance and document, then can return here for shots with nurse appt or at pharmacy   Covid booster  Tetanus   Hypoxia Hypoxia is a condition that happens when there is a lack of oxygen in the body's tissues and organs. When there is not enough oxygen, organs cannot work as they should. This causes serious problems throughout the body and in the brain. What are the causes? This condition may be caused by: Exposure to high altitude. A collapsed lung (pneumothorax). Lung infection (pneumonia). Lung injury. Long-term (chronic) lung disease, such as chronic obstructive pulmonary disease (COPD) or emphysema. Fluid collecting in the chest cavity (congestive heart failure), or blood collecting in the chest cavity (hemothorax). Food, saliva, or vomit getting into the airway (aspiration). Reduced blood flow (ischemia). Severe blood loss. Slow or shallow breathing (hypoventilation). Blood disorders, such as anemia. Carbon monoxide or cyanide poisoning. The heart suddenly stopping (cardiac arrest). Medicines or recreational drugs with severe sedating effects. Drowning. Choking. What are the signs or symptoms? Symptoms of this condition include: Headache. Feeling tired (fatigue). Forgetfulness. Nausea. Confusion. Shortness of breath. Dizziness. Bluish color of the skin, lips, or nail beds (cyanosis). Change in consciousness or awareness. If hypoxia is not treated, it can lead to convulsions, loss of consciousness (coma), or brain damage, which can be life-threatening. How is this diagnosed? This condition may be diagnosed based on: A physical exam. Blood tests. A test that measures how much oxygen is in your blood (pulse oximetry). This is done with a sensor that is placed on your finger, toe, or earlobe. Imaging, such as a chest X-ray or CT scan. Tests to check your lung function (pulmonary function  tests). A test to check the electrical activity of your heart (electrocardiogram, ECG). You may have other tests to determine the cause of your hypoxia. How is this treated?  Treatment for this condition depends on what is causing the hypoxia. You will likely be treated with oxygen therapy. This may be done by giving you oxygen through a face mask or through tubes in your nose. Your health care provider may also recommend other therapies to treat the underlying cause of your hypoxia. Follow these instructions at home: Take over-the-counter and prescription medicines only as told by your health care provider. Do not use any products that contain nicotine or tobacco. These products include cigarettes, chewing tobacco, and vaping devices, such as e-cigarettes. If you need help quitting, ask your health care provider. Avoid secondhand smoke. Work with your health care provider to manage any chronic conditions you have that may be causing hypoxia, such as COPD. Keep all follow-up visits. This is important. Contact a health care provider if: You have a fever. You become extremely short of breath when you exercise. Get help right away if: Your shortness of breath gets worse, especially with normal or very little activity. You have trouble breathing, even after treatment. Your skin, lips, or nail beds have a bluish color. You become confused or you cannot think properly. You have chest pain. These symptoms may be an emergency. Get help right away. Call 911. Do not wait to see if the symptoms will go away. Do not drive yourself to the hospital. Summary Hypoxia is a condition that happens when there is a lack of oxygen in the body's tissues and organs. If hypoxia is not treated, it can lead to convulsions, loss of consciousness (coma), or brain damage. Symptoms of  hypoxia can include a headache, shortness of breath, confusion, nausea, and a bluish skin color. Hypoxia has many possible causes,  including exposure to high altitude, carbon monoxide poisoning, or other health issues, such as blood disorders or cardiac arrest. Hypoxia is usually treated with oxygen therapy. This information is not intended to replace advice given to you by your health care provider. Make sure you discuss any questions you have with your health care provider. Document Revised: 02/02/2021 Document Reviewed: 02/02/2021 Elsevier Patient Education  2024 [employer].

## 2023-05-25 NOTE — Progress Notes (Signed)
Subjective:    [name], male    DOB: 1946/01/31, 77 y.o.   MRN: [id number]  Chief Complaint  Patient presents with  . Follow-up    HPI Discussed the use of AI scribe software for clinical note transcription with the patient, who gave verbal consent to proceed.  History of Present Illness   The patient, with a history of diabetes, pulmonary fibrosis, and neuropathy, presents with increased fatigue. He reports that his blood sugar levels have been consistently high, with the lowest reading in the past month being around 160. The patient's spouse confirms that the patient's diet includes a lot of junk food and not enough protein. The patient also mentions having neuropathy in his feet, which has led to numbness and tenderness. He reports difficulty in walking and a decrease in his usual physical activity due to the neuropathy and pulmonary fibrosis. The patient is on oxygen therapy but admits to not using it as frequently as recommended, which could be contributing to his fatigue. The patient's spouse suggests that the patient's reluctance to use oxygen therapy might be due to concerns about appearing old. The patient expresses a desire to lose weight and improve his health.        Past Medical History:  Diagnosis Date  . Acute pharyngitis 10/21/2013  . Allergy    grass and pollen  . Anxiety and depression 10/25/2011  . BPH (benign prostatic hyperplasia) 04/23/2012  . Chicken pox as a child  . DDD (degenerative disc disease)    cervical responds to steroid injections and low back required surgery  . DDD (degenerative disc disease), lumbosacral   . Diabetes mellitus    pre  . Dyspnea   . ED (erectile dysfunction) 04/23/2012  . Elevated BP   . 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 Esophageal reflux 02/10/2015  . Fatigue   . HTN (hypertension)   . Hyperglycemia    preDM   . Hyperlipidemia   . Insomnia   . Low back pain radiating to both legs 01/16/2017  . Measles as a child   . Overweight(278.02)   . Peripheral neuropathy 08/07/2019  . Personal history of colonic polyps 10/27/2012   Follows with [facility]  . Pneumonia    " walking"  . Preventative health care   . Testosterone deficiency 05/23/2012  . Wears glasses     Past Surgical History:  Procedure Laterality Date  . ATRIAL FIBRILLATION ABLATION N/A 01/27/2022   Procedure: ATRIAL FIBRILLATION ABLATION;  Surgeon: Regan Lemming, MD;  Location: [facility];  Service: Cardiovascular;  Laterality: N/A;  . BACK SURGERY  2012 and 1994   Dr Charlesetta Garibaldi, screws and cage in low back  . BIOPSY  01/09/2020   Procedure: BIOPSY;  Surgeon: Kathi Der, MD;  Location: [facility];  Service: Gastroenterology;;  . BRONCHIAL BIOPSY  11/17/2020   Procedure: BRONCHIAL BIOPSIES;  Surgeon: Tomma Lightning, MD;  Location: [facility];  Service: Endoscopy;;  . BRONCHIAL BRUSHINGS  11/17/2020   Procedure: BRONCHIAL BRUSHINGS;  Surgeon: Tomma Lightning, MD;  Location: [facility];  Service: Endoscopy;;  . BRONCHIAL WASHINGS  11/17/2020   Procedure: BRONCHIAL WASHINGS;  Surgeon: Tomma Lightning, MD;  Location: [facility];  Service: Endoscopy;;  . COLONOSCOPY WITH PROPOFOL N/A 01/09/2020   Procedure: COLONOSCOPY WITH PROPOFOL;  Surgeon: Kathi Der, MD;  Location: [facility];  Service: Gastroenterology;  Laterality: N/A;  . ESOPHAGOGASTRODUODENOSCOPY (EGD) WITH PROPOFOL N/A 01/09/2020   Procedure: ESOPHAGOGASTRODUODENOSCOPY (EGD) WITH PROPOFOL;  Surgeon: Kathi Der, MD;  Location: [facility];  Service: Gastroenterology;  Laterality: N/A;  . EYE SURGERY Bilateral    2016  . HARDWARE REVISION  03/12/2019   REVISION OF HARDWARE THORACIC TEN-THORACIC ELEVEN WITH APPLICATION OF ADDITIONAL RODS AND ROD SLEEVES THORACIC EIGHT-THORACIC TWELVE 12-LUMBAR ONE (N/A )  . HEMOSTASIS CONTROL  11/17/2020   Procedure: HEMOSTASIS CONTROL;  Surgeon: Tomma Lightning, MD;  Location: [facility];   Service: Endoscopy;;  . IR US GUIDE BX ASP/DRAIN  11/13/2020  . LAMINECTOMY  02/12/2019   DECOMPRESSIVE LAMINECTOMY THORACIC NINE-THORACIC TEN AND THORACIC TEN-THORACIC ELEVEN, EXTENSION OF THORACOLUMBAR FUSION FROM THORACIC TEN TO THORACIC FIVE, LOCAL BONE GRAFT, ALLOGRAFT AND VIVIGEN (N/A)  . POLYPECTOMY  01/09/2020   Procedure: POLYPECTOMY;  Surgeon: Kathi Der, MD;  Location: [facility];  Service: Gastroenterology;;  . REMOVAL OF RODS AND PEDICLE SCREWS T5 AND T6, EXPLORATION OF FUSION, BIOPSY TRANSPEDICULAR T5 AND T6 (N/A )  09/14/2020  . SPINAL FUSION N/A 02/12/2019   Procedure: DECOMPRESSIVE LAMINECTOMY THORACIC NINE-THORACIC TEN AND THORACIC TEN-THORACIC ELEVEN, EXTENSION OF THORACOLUMBAR FUSION FROM THORACIC TEN TO THORACIC FIVE, LOCAL BONE GRAFT, ALLOGRAFT AND VIVIGEN;  Surgeon: Kerrin Champagne, MD;  Location: [facility];  Service: Orthopedics;  Laterality: N/A;  DECOMPRESSIVE LAMINECTOMY THORACIC NINE-THORACIC TEN AND THORACIC TEN-THORACIC ELEVEN, EXTENSION OF TH  . SPINAL FUSION N/A 03/12/2019   Procedure: REVISION OF HARDWARE THORACIC TEN-THORACIC ELEVEN WITH APPLICATION OF ADDITIONAL RODS AND ROD SLEEVES THORACIC EIGHT-THORACIC TWELVE 12-LUMBAR ONE;  Surgeon: Kerrin Champagne, MD;  Location: [facility];  Service: Orthopedics;  Laterality: N/A;  . TONSILLECTOMY     as child  . torn rotator cuff  2010   right  . VIDEO BRONCHOSCOPY N/A 11/17/2020   Procedure: VIDEO BRONCHOSCOPY WITH FLUORO;  Surgeon: Tomma Lightning, MD;  Location: [facility];  Service: Endoscopy;  Laterality: N/A;    Family History  Problem Relation Age of Onset  . Hypertension Mother   . Diabetes Mother        type 2  . Cancer Mother 23       breast in remission  . Emphysema Father        smoker  . COPD Father        smoker  . Stroke Father 71       mini  . Heart disease Father   . Hypertension Sister   . Hyperlipidemia Sister   . Scoliosis Sister   . Osteoporosis Sister   . Hypertension Maternal  Grandmother   . Scoliosis Maternal Grandmother   . Heart disease Maternal Grandfather   . Heart disease Paternal Grandfather        smoker  . Heart disease Daughter     Social History   Socioeconomic History  . Marital status: Married    Spouse name: Not on file  . Number of children: Not on file  . Years of education: Not on file  . Highest education level: 12th grade  Occupational History  . Not on file  Tobacco Use  . Smoking status: Former    Current packs/day: 0.00    Average packs/day: 1 pack/day for 20.0 years (20.0 ttl pk-yrs)    Types: Cigarettes    Start date: 07/18/1969    Quit date: 07/18/1989    Years since quitting: 33.8  . Smokeless tobacco: Never  . Tobacco comments:    Former smoker 03/16/22  Vaping Use  . Vaping status: Never Used  Substance and Sexual Activity  . Alcohol use:  Yes    Alcohol/week: 2.0 standard drinks of alcohol    Types: 2 Standard drinks or equivalent per week    Comment: couple beers here and there 03/16/22  . Drug use: No  . Sexual activity: Yes    Comment: lives with wife, still working, no dietary restrictions, continues to exercise intermittently  Other Topics Concern  . Not on file  Social History Narrative  . Not on file   Social Determinants of Health   Financial Resource Strain: Low Risk  (01/13/2023)   Overall Financial Resource Strain (CARDIA)   . Difficulty of Paying Living Expenses: Not hard at all  Food Insecurity: No Food Insecurity (01/13/2023)   Hunger Vital Sign   . Worried About Programme researcher, broadcasting/film/video in the Last Year: Never true   . Ran Out of Food in the Last Year: Never true  Transportation Needs: No Transportation Needs (01/13/2023)   PRAPARE - Transportation   . Lack of Transportation (Medical): No   . Lack of Transportation (Non-Medical): No  Physical Activity: Inactive (01/13/2023)   Exercise Vital Sign   . Days of Exercise per Week: 0 days   . Minutes of Exercise per Session: 0 min  Stress: Stress Concern  Present (01/13/2023)   [employer] of Occupational Health - Occupational Stress Questionnaire   . Feeling of Stress : To some extent  Social Connections: Socially Integrated (01/13/2023)   Social Connection and Isolation Panel [NHANES]   . Frequency of Communication with Friends and Family: Three times a week   . Frequency of Social Gatherings with Friends and Family: Patient declined   . Attends Religious Services: More than 4 times per year   . Active Member of Clubs or Organizations: Yes   . Attends Banker Meetings: More than 4 times per year   . Marital Status: Married  Catering manager Violence: Not At Risk (01/13/2023)   Humiliation, Afraid, Rape, and Kick questionnaire   . Fear of Current or Ex-Partner: No   . Emotionally Abused: No   . Physically Abused: No   . Sexually Abused: No    Outpatient Medications Prior to Visit  Medication Sig Dispense Refill  . albuterol (PROVENTIL HFA) 108 (90 Base) MCG/ACT inhaler TAKE 2 PUFFS BY MOUTH EVERY 6 HOURS AS NEEDED FOR WHEEZE OR SHORTNESS OF BREATH 8 g 5  . atorvastatin (LIPITOR) 10 MG tablet TAKE 1 TABLET BY MOUTH EVERY DAY 90 tablet 1  . busPIRone (BUSPAR) 15 MG tablet Take 1 tablet (15 mg total) by mouth 3 (three) times daily. 270 tablet 1  . dextromethorphan-guaiFENesin (MUCINEX DM) 30-600 MG 12hr tablet Take 1 tablet by mouth 2 (two) times daily.    . famciclovir (FAMVIR) 500 MG tablet Take 1 tablet (500 mg total) by mouth 3 (three) times daily. 21 tablet 0  . fluticasone (FLONASE) 50 MCG/ACT nasal spray SPRAY 2 SPRAYS INTO EACH NOSTRIL EVERY DAY 48 mL 1  . Fluticasone-Umeclidin-Vilant (TRELEGY ELLIPTA) 200-62.5-25 MCG/ACT AEPB Inhale 1 puff into the lungs daily. 60 each 5  . glucose blood (CONTOUR NEXT TEST) test strip Check blood sugar once daily 100 each 12  . insulin glargine (LANTUS SOLOSTAR) 100 UNIT/ML Solostar Pen Inject 26 Units into the skin daily. 15 mL 5  . insulin lispro (HUMALOG) 100 UNIT/ML injection  Inject 0.03 mLs (3 Units total) into the skin 3 (three) times daily with meals. 100 mL 1  . Insulin Pen Needle (B-D UF III MINI PEN NEEDLES) 31G X 5 MM  MISC USE WITH LANTUS 100 each 3  . Insulin Pen Needle 32G X 4 MM MISC Use with insulin 1 each 3  . Insulin Syringe-Needle U-100 (B-D INS SYR ULTRAFINE 1CC/30G) 30G X 1/2" 1 ML MISC USE WITH LANTUS 200 each 1  . Insulin Syringes, Disposable, U-100 1 ML MISC USE WITH LANTUS 200 each 2  . meclizine (ANTIVERT) 12.5 MG tablet Take 1 tablet (12.5 mg total) by mouth 3 (three) times daily as needed for dizziness. 30 tablet 2  . metFORMIN (GLUCOPHAGE) 1000 MG tablet TAKE 1 TABLET (1,000 MG TOTAL) BY MOUTH TWICE A DAY WITH FOOD 180 tablet 0  . methylPREDNISolone (MEDROL) 4 MG tablet 4 tab po day 1, 3 tab po day 2, 2 tab po day 3 and 1 tab po day 4 16 tablet 0  . oxyCODONE-acetaminophen (PERCOCET/ROXICET) 5-325 MG tablet Take 1 tablet by mouth every 4 (four) hours as needed for pain- frequency change 180 tablet 0  . pantoprazole (PROTONIX) 40 MG tablet Take 1 tablet (40 mg total) by mouth 2 (two) times daily before a meal. 180 tablet 1  . predniSONE (DELTASONE) 5 MG tablet TAKE 1 TABLET BY MOUTH EVERY DAY 30 tablet 0  . predniSONE (DELTASONE) 5 MG tablet Take 1 tablet (5 mg total) by mouth daily. 30 tablet 1  . pregabalin (LYRICA) 75 MG capsule TAKE 1 CAPSULE BY MOUTH TWICE A DAY (Patient taking differently: Take 75 mg by mouth 2 (two) times daily.) 60 capsule 3  . sitaGLIPtin (JANUVIA) 50 MG tablet Take 1 tablet (50 mg total) by mouth daily. 90 tablet 1  . tamsulosin (FLOMAX) 0.4 MG CAPS capsule Take 2 capsules (0.8 mg total) by mouth daily. 180 capsule 1  . tiZANidine (ZANAFLEX) 4 MG tablet Take 1 and 1/2 tablets (6 mg total) by mouth every 4 hours. 180 tablet 2  . venlafaxine XR (EFFEXOR-XR) 150 MG 24 hr capsule Take 1 capsule (150 mg total) by mouth daily with breakfast. 90 capsule 0  . warfarin (COUMADIN) 5 MG tablet TAKE 1 TO 2 TABLETS DAILY OR AS  PRESCRIBED BY COUMADIN CLINIC 130 tablet 1  . zolpidem (AMBIEN) 10 MG tablet Take 0.5-1 tablets (5-10 mg total) by mouth at bedtime as needed for sleep. 15 tablet 3   No facility-administered medications prior to visit.    Allergies  Allergen Reactions  . Daptomycin     Drug induced pneumonia   . Diltiazem Other (See Comments)    Passed out  . Ofev [Nintedanib] Diarrhea    fatigue  . Pirfenidone Diarrhea    fatigue    Review of Systems  Constitutional:  Positive for malaise/fatigue. Negative for fever.  HENT:  Negative for congestion.   Eyes:  Negative for blurred vision.  Respiratory:  Positive for cough and shortness of breath.   Cardiovascular:  Negative for chest pain, palpitations and leg swelling.  Gastrointestinal:  Negative for abdominal pain, blood in stool and nausea.  Genitourinary:  Negative for dysuria and frequency.  Musculoskeletal:  Negative for falls.  Skin:  Negative for rash.  Neurological:  Positive for sensory change. Negative for dizziness, loss of consciousness and headaches.  Endo/Heme/Allergies:  Negative for environmental allergies.  Psychiatric/Behavioral:  Negative for depression. The patient is not nervous/anxious.        Objective:    Physical Exam Vitals reviewed.  Constitutional:      Appearance: Normal appearance. He is not ill-appearing.  HENT:     Head: Normocephalic and atraumatic.  Nose: Nose normal.  Eyes:     Conjunctiva/sclera: Conjunctivae normal.  Cardiovascular:     Rate and Rhythm: Normal rate.     Pulses: Normal pulses.     Heart sounds: Normal heart sounds. No murmur heard. Pulmonary:     Effort: Pulmonary effort is normal.     Breath sounds: Rhonchi present. No wheezing.     Comments: Rhonchi lLL Abdominal:     Palpations: Abdomen is soft. There is no mass.     Tenderness: There is no abdominal tenderness.  Musculoskeletal:     Cervical back: Normal range of motion.     Right lower leg: No edema.     Left  lower leg: No edema.  Skin:    General: Skin is warm and dry.  Neurological:     General: No focal deficit present.     Mental Status: He is alert and oriented to person, place, and time.  Psychiatric:        Mood and Affect: Mood normal.   BP 122/60 (BP Location: Right Arm, Patient Position: Sitting, Cuff Size: Large)   Pulse 78   Temp 97.9 F (36.6 C) (Oral)   Resp 18   Ht 5\' 10"  (1.778 m)   Wt 198 lb 6.4 oz (90 kg)   SpO2 95%   BMI 28.47 kg/m  Wt Readings from Last 3 Encounters:  05/25/23 198 lb 6.4 oz (90 kg)  05/19/23 196 lb 9.6 oz (89.2 kg)  03/24/23 193 lb (87.5 kg)    Diabetic Foot Exam - Simple   Simple Foot Form Diabetic Foot exam was performed with the following findings: Yes 05/25/2023  2:30 PM  Visual Inspection No deformities, no ulcerations, no other skin breakdown bilaterally: Yes Sensation Testing See comments: Yes Pulse Check Posterior Tibialis and Dorsalis pulse intact bilaterally: Yes Comments Decreased sensation to monofilament bilaterally    Lab Results  Component Value Date   WBC 8.2 05/19/2023   HGB 13.0 05/19/2023   HCT 39.9 05/19/2023   PLT 396.0 05/19/2023   GLUCOSE 216 (H) 05/19/2023   CHOL 195 12/22/2022   TRIG 110.0 12/22/2022   HDL 97.20 12/22/2022   LDLDIRECT 64.0 10/30/2017   LDLCALC 76 12/22/2022   ALT 14 05/19/2023   AST 20 05/19/2023   NA 129 (L) 05/19/2023   K 5.0 05/19/2023   CL 95 (L) 05/19/2023   CREATININE 0.71 05/19/2023   BUN 11 05/19/2023   CO2 27 05/19/2023   TSH 2.49 05/19/2023   PSA 0.65 10/08/2019   INR 2.3 04/07/2023   HGBA1C 8.8 (H) 05/19/2023   MICROALBUR 2.5 (H) 08/23/2022    Lab Results  Component Value Date   TSH 2.49 05/19/2023   Lab Results  Component Value Date   WBC 8.2 05/19/2023   HGB 13.0 05/19/2023   HCT 39.9 05/19/2023   MCV 90.4 05/19/2023   PLT 396.0 05/19/2023   Lab Results  Component Value Date   NA 129 (L) 05/19/2023   K 5.0 05/19/2023   CO2 27 05/19/2023   GLUCOSE 216  (H) 05/19/2023   BUN 11 05/19/2023   CREATININE 0.71 05/19/2023   BILITOT 0.7 05/19/2023   ALKPHOS 108 05/19/2023   AST 20 05/19/2023   ALT 14 05/19/2023   PROT 5.8 (L) 05/19/2023   ALBUMIN 4.0 05/19/2023   CALCIUM 9.1 05/19/2023   ANIONGAP 12 01/07/2023   EGFR 92 01/25/2022   GFR 87.13 05/19/2023   Lab Results  Component Value Date   CHOL 195  12/22/2022   Lab Results  Component Value Date   HDL 97.20 12/22/2022   Lab Results  Component Value Date   LDLCALC 76 12/22/2022   Lab Results  Component Value Date   TRIG 110.0 12/22/2022   Lab Results  Component Value Date   CHOLHDL 2 12/22/2022   Lab Results  Component Value Date   HGBA1C 8.8 (H) 05/19/2023       Assessment & Plan:  Atrial fibrillation, unspecified type (HCC) Assessment & Plan: Rate controlled tolerating meds   Chronic respiratory failure with hypoxia (HCC) Assessment & Plan: Following with pulmonology and cardiology   Diabetes mellitus due to underlying condition with hyperglycemia, with long-term current use of insulin (HCC) Assessment & Plan: hgba1c acceptable, minimize simple carbs. Increase exercise as tolerated. Continue current meds    Essential hypertension Assessment & Plan: Well controlled, no changes to meds. Encouraged heart healthy diet such as the DASH diet and exercise as tolerated.     IPF (idiopathic pulmonary fibrosis) (HCC) Assessment & Plan: Follows with cardiology   Other orders -     Tirzepatide; Inject 2.5 mg into the skin once a week.  Dispense: 2 mL; Refill: 1    Assessment and Plan    Hyperglycemia Persistent high blood sugars contributing to fatigue. Discussed the importance of diet, particularly pairing carbs with proteins to slow down sugar spikes and drops. -Start Mounjaro 2.5mg  weekly for 4-8 weeks, then reassess for possible dose increase to 5mg . -Continue current diabetes medications, but advised to decrease Lantus by a few units if sugars start  running below 100. -Check blood sugars before meals, unless running low.  Neuropathy Numbness in feet and legs, contributing to difficulty walking and fatigue. Feet are tender due to thinning skin. No skin breakdown noted on exam. -Continue current management, including keeping feet clean and dry, and wearing compression socks. -Advised to never go barefoot to prevent injury.  Pulmonary Fibrosis Breathlessness with exertion, contributing to difficulty exercising and fatigue. Some crackles noted on left lung during exam. -Increase use of supplemental oxygen, particularly during periods of rest or relaxation. -Continue Mucinex DM as prescribed to manage mucus production.  Vitamin B1 Overdose High B1 levels possibly due to taking a B complex. -Reduce B complex to every other day until current supply is used up. -Check B1 levels in one month. If still high, switch to B12 only.  General Health Maintenance -Administer COVID booster and first shingles shot today. -Administer second shingles shot and tetanus shot in 2-3 months. -Continue regular eye exams. -Complete foot exams regularly due to neuropathy. -Apply for handicap parking permit. -Follow-up visit and labs in 3 months.         Danise Edge, MD

## 2023-05-26 ENCOUNTER — Encounter: Payer: Self-pay | Admitting: *Deleted

## 2023-05-31 ENCOUNTER — Ambulatory Visit: Payer: Medicare Other | Admitting: Nurse Practitioner

## 2023-05-31 ENCOUNTER — Encounter: Payer: Self-pay | Admitting: Nurse Practitioner

## 2023-05-31 VITALS — BP 110/69 | HR 90 | Temp 97.1°F | Ht 70.0 in | Wt 199.2 lb

## 2023-05-31 DIAGNOSIS — J84112 Idiopathic pulmonary fibrosis: Secondary | ICD-10-CM

## 2023-05-31 DIAGNOSIS — G4734 Idiopathic sleep related nonobstructive alveolar hypoventilation: Secondary | ICD-10-CM

## 2023-05-31 DIAGNOSIS — R0609 Other forms of dyspnea: Secondary | ICD-10-CM | POA: Diagnosis not present

## 2023-05-31 DIAGNOSIS — Z86711 Personal history of pulmonary embolism: Secondary | ICD-10-CM

## 2023-05-31 DIAGNOSIS — J302 Other seasonal allergic rhinitis: Secondary | ICD-10-CM | POA: Diagnosis not present

## 2023-05-31 DIAGNOSIS — J439 Emphysema, unspecified: Secondary | ICD-10-CM | POA: Diagnosis not present

## 2023-05-31 DIAGNOSIS — I480 Paroxysmal atrial fibrillation: Secondary | ICD-10-CM | POA: Diagnosis not present

## 2023-05-31 DIAGNOSIS — R600 Localized edema: Secondary | ICD-10-CM

## 2023-05-31 MED ORDER — PREDNISONE 10 MG PO TABS: ORAL_TABLET | ORAL | 0 refills | Status: DC

## 2023-05-31 NOTE — Progress Notes (Unsigned)
@Patient  ID: Luis Brown, male    DOB: 03/23/1946, 77 y.o.   MRN: [id number]  Chief Complaint  Patient presents with   Shortness of Breath    Patient states he has pulmonary fibrosis and recently it has been harder to breathe. Patient states he does use oxygen at night, 1.5lpm. Pt wants to know if there is something he can do or take to make his breathing easier     Referring provider: Bradd Canary, MD  HPI: 77 year old male, former smoker followed for IPF, COPD and chronic respiratory failure.  He is a patient of Dr. Jane Canary and last seen in office 02/02/2023.  Past medical history significant for PAF on Coumadin, history of PE, GERD, DM, anxiety, insomnia, HLD.  TEST/EVENTS:  12/08/2020 PFT: FVC 71, FEV1 65, ratio 70, TLC 71, DLCOcor 82.  Moderate obstructive airway disease without reversibility and normal diffusing capacity. 07/2021 serologies negative 09/2021 negative RAST, IgE normal; A1 AT 168, MM 12/30/2021 PFT: FVC 79, FEV1 84, ratio 76, DLCO 70 07/13/2022 HRCT chest: Atherosclerosis/CAD.  Patchy areas of mild groundglass attenuation, septal thickening and subpleural reticulation, thickening of the peribronchovascular interstitium, cylindrical bronchiectasis and peripheral bronchiolectasis.  Developing areas of honeycombing.  Stable compared to prior.  UIP.  Severe collapse of the trachea with inspiratory and expiratory imaging, consistent with tracheobronchomalacia.  Centrilobular and paraseptal emphysema.  Small 5 x 4 mm left lower lobe nodule and 4 mm right lower lobe nodule below stable and considered benign. 08/02/2022 PFT: FVC 75, FEV1 82, ratio 78, DLCO 57 11/10/2022 PFT: FVC 82, FEV1 88, ratio 76, DLCO 69 12/2022 rheumatoid factor negative, RMSF negative, Lyme serologies negative 02/01/2023 PFT: FVC 76, FEV1 85, ratio 79, DLCO 59  02/02/2023: Ov with Dr. Marchelle Gearing. Drug induced daptomycin pneumonitis spring 2022. PE with ILD flare Dec 2022/Jan 2023. Diagnostic revision to  IPF June 2023 after CT with progression since May 2022. Started esbriet - stopped summer 2023 d/t nausea/headache/muscle aches. Tried Ofev - severe fatigue despite rechallenge on lower dose. Stopped may 2024. 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 Tick Bite June 2024- RMSF and Lyme negative. Persistent arthralgia of right hand - Rheumatoid factor negative. Referred to rheumatology by PCP. On prednisone taper due to joint pain - plan to taper off and back to 5 mg daily, which was initially started due to concern for drug-induced ILD. At some point, will need to stop this. Can work on tapering at next visit, if stable.  Returns for follow up. Feels stable from pulmonary standpoint. Lung function testing stable. Stopped Ofev since last visit. Fatigue is better. Consider clinical trial Q4 2024 or Q1 2025. Continued Trelegy. Repeat Spiro/DLCO in 16 weeks.   05/31/2023: Today - acute Discussed the use of AI scribe software for clinical note transcription with the patient, who gave verbal consent to proceed.  History of Present Illness   Luis Brown, a patient with a history of interstitial lung disease, presents with an increase in respiratory symptoms over the past three weeks. He reports feeling unwell and experiencing a marked deterioration in breathing that he believes started after a cold. He does still have some increased sinus drainage, which is clear. No facial tenderness, headaches. He denies any associated cough, fever, chills, or hemoptysis. He has been using Mucinex DM to manage mucus production and an albuterol rescue inhaler, which provides temporary relief. He also still uses Trelegy once a day. He takes a daily  allergy pill. He has flonase but does not use it regularly. He has noticed an increase in leg swelling, which is a chronic issue due to advanced neuropathy. He also reports a recent weight gain, which he attributes to a diet high in sweets and  low in protein.  He also reports using supplemental oxygen at night and occasionally during the day due to increased breathlessness. Despite this, his home oxygen saturation levels remain satisfactory, with the lowest recorded level being 96%.  He denies any missed doses of Coumadin and reports no new medications.   He also reports a decrease in physical activity due to worsening respiratory symptoms, including difficulty walking the dogs and performing household chores. He denies any wheezing, except when outside for prolonged periods.  He had a chest x-ray with his PCP following a fall and subsequent rib surgery, which did not show any signs of pneumonia.        Allergies  Allergen Reactions   Daptomycin     Drug induced pneumonia    Diltiazem Other (See Comments)    Passed out   Ofev [Nintedanib] Diarrhea    fatigue   Pirfenidone Diarrhea    fatigue    Immunization History  Administered Date(s) Administered   Fluad Quad(high Dose 65+) 04/05/2019, 06/29/2020, 05/18/2021, 04/22/2022   Influenza Split 04/23/2012   Influenza Whole 02/16/2011   Influenza, High Dose Seasonal PF 05/06/2013, 03/25/2016, 05/05/2017, 06/07/2018   Influenza-Unspecified 03/26/2023   PFIZER(Purple Top)SARS-COV-2 Vaccination 08/23/2019, 09/13/2019, 06/07/2020   Pfizer Covid-19 Vaccine Bivalent Booster 28yrs & up 04/29/2021   Pfizer(Comirnaty)Fall Seasonal Vaccine 12 years and older 05/23/2022, 05/25/2023   Pneumococcal Conjugate-13 03/25/2016   Pneumococcal Polysaccharide-23 01/20/2009, 10/19/2011   Respiratory Syncytial Virus Vaccine,Recomb Aduvanted(Arexvy) 04/09/2022   Td 07/09/2003, 07/18/2008   Td (Adult) 07/09/2003, 07/18/2008   Tdap 07/20/2011   Zoster Recombinant(Shingrix) 05/25/2023   Zoster, Live 04/27/2016    Past Medical History:  Diagnosis Date   Acute pharyngitis 10/21/2013   Allergy    grass and pollen   Anxiety and depression 10/25/2011   BPH (benign prostatic hyperplasia)  04/23/2012   Chicken pox as a child   DDD (degenerative disc disease)    cervical responds to steroid injections and low back required surgery   DDD (degenerative disc disease), lumbosacral    Diabetes mellitus    pre   Dyspnea    ED (erectile dysfunction) 04/23/2012   Elevated BP    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   Esophageal reflux 02/10/2015   Fatigue    HTN (hypertension)    Hyperglycemia    preDM    Hyperlipidemia    Insomnia    Low back pain radiating to both legs 01/16/2017   Measles as a child   Overweight(278.02)    Peripheral neuropathy 08/07/2019   Personal history of colonic polyps 10/27/2012   Follows with [facility]   Pneumonia    " walking"   Preventative health care    Testosterone deficiency 05/23/2012   Wears glasses     Tobacco History: Social History   Tobacco Use  Smoking Status Former   Current packs/day: 0.00   Average packs/day: 1 pack/day for 20.0 years (20.0 ttl pk-yrs)   Types: Cigarettes   Start date: 07/18/1969   Quit date: 07/18/1989   Years since quitting: 33.8  Smokeless Tobacco Never  Tobacco Comments   Former smoker 03/16/22   Counseling given: Not Answered Tobacco comments: Former smoker 03/16/22   Outpatient Medications Prior to Visit  Medication Sig Dispense  Refill   albuterol (PROVENTIL HFA) 108 (90 Base) MCG/ACT inhaler TAKE 2 PUFFS BY MOUTH EVERY 6 HOURS AS NEEDED FOR WHEEZE OR SHORTNESS OF BREATH 8 g 5   atorvastatin (LIPITOR) 10 MG tablet TAKE 1 TABLET BY MOUTH EVERY DAY 90 tablet 1   busPIRone (BUSPAR) 15 MG tablet Take 1 tablet (15 mg total) by mouth 3 (three) times daily. 270 tablet 1   dextromethorphan-guaiFENesin (MUCINEX DM) 30-600 MG 12hr tablet Take 1 tablet by mouth 2 (two) times daily.     famciclovir (FAMVIR) 500 MG tablet Take 1 tablet (500 mg total) by mouth 3 (three) times daily. 21 tablet 0   fluticasone (FLONASE) 50 MCG/ACT nasal spray SPRAY 2 SPRAYS INTO EACH NOSTRIL EVERY DAY 48 mL 1    Fluticasone-Umeclidin-Vilant (TRELEGY ELLIPTA) 200-62.5-25 MCG/ACT AEPB Inhale 1 puff into the lungs daily. 60 each 5   glucose blood (CONTOUR NEXT TEST) test strip Check blood sugar once daily 100 each 12   insulin glargine (LANTUS SOLOSTAR) 100 UNIT/ML Solostar Pen Inject 26 Units into the skin daily. 15 mL 5   insulin lispro (HUMALOG) 100 UNIT/ML injection Inject 0.03 mLs (3 Units total) into the skin 3 (three) times daily with meals. 100 mL 1   Insulin Pen Needle (B-D UF III MINI PEN NEEDLES) 31G X 5 MM MISC USE WITH LANTUS 100 each 3   Insulin Pen Needle 32G X 4 MM MISC Use with insulin 1 each 3   Insulin Syringe-Needle U-100 (B-D INS SYR ULTRAFINE 1CC/30G) 30G X 1/2" 1 ML MISC USE WITH LANTUS 200 each 1   Insulin Syringes, Disposable, U-100 1 ML MISC USE WITH LANTUS 200 each 2   meclizine (ANTIVERT) 12.5 MG tablet Take 1 tablet (12.5 mg total) by mouth 3 (three) times daily as needed for dizziness. 30 tablet 2   metFORMIN (GLUCOPHAGE) 1000 MG tablet TAKE 1 TABLET (1,000 MG TOTAL) BY MOUTH TWICE A DAY WITH FOOD 180 tablet 0   methylPREDNISolone (MEDROL) 4 MG tablet 4 tab po day 1, 3 tab po day 2, 2 tab po day 3 and 1 tab po day 4 16 tablet 0   oxyCODONE-acetaminophen (PERCOCET/ROXICET) 5-325 MG tablet Take 1 tablet by mouth every 4 (four) hours as needed for pain- frequency change 180 tablet 0   pantoprazole (PROTONIX) 40 MG tablet Take 1 tablet (40 mg total) by mouth 2 (two) times daily before a meal. 180 tablet 1   predniSONE (DELTASONE) 5 MG tablet TAKE 1 TABLET BY MOUTH EVERY DAY 30 tablet 0   predniSONE (DELTASONE) 5 MG tablet Take 1 tablet (5 mg total) by mouth daily. 30 tablet 1   pregabalin (LYRICA) 75 MG capsule TAKE 1 CAPSULE BY MOUTH TWICE A DAY (Patient taking differently: Take 75 mg by mouth 2 (two) times daily.) 60 capsule 3   sitaGLIPtin (JANUVIA) 50 MG tablet Take 1 tablet (50 mg total) by mouth daily. 90 tablet 1   tamsulosin (FLOMAX) 0.4 MG CAPS capsule Take 2 capsules (0.8 mg  total) by mouth daily. 180 capsule 1   tirzepatide (MOUNJARO) 2.5 MG/0.5ML Pen Inject 2.5 mg into the skin once a week. 2 mL 1   tiZANidine (ZANAFLEX) 4 MG tablet Take 1 and 1/2 tablets (6 mg total) by mouth every 4 hours. 180 tablet 2   venlafaxine XR (EFFEXOR-XR) 150 MG 24 hr capsule Take 1 capsule (150 mg total) by mouth daily with breakfast. 90 capsule 0   warfarin (COUMADIN) 5 MG tablet TAKE 1 TO 2  TABLETS DAILY OR AS PRESCRIBED BY COUMADIN CLINIC 130 tablet 1   zolpidem (AMBIEN) 10 MG tablet Take 0.5-1 tablets (5-10 mg total) by mouth at bedtime as needed for sleep. 15 tablet 3   No facility-administered medications prior to visit.     Review of Systems:   Constitutional: No night sweats, fevers, chills,  or lassitude. +fatigue, weight gain   HEENT: No headaches, difficulty swallowing, tooth/dental problems, or sore throat. No sneezing, itching, ear ache. + nasal congestion, post nasal drip CV:  +swelling in lower extremities. No chest pain, orthopnea, PND, anasarca, dizziness, palpitations, syncope Resp: +shortness of breath with exertion. No excess mucus or change in color of mucus. No productive or non-productive. No hemoptysis. No wheezing.  No chest wall deformity GI:  No heartburn, indigestion, abdominal pain, nausea, vomiting, diarrhea, change in bowel habits, loss of appetite, bloody stools.  GU: No dysuria, change in color of urine, urgency or frequency.  No flank pain, no hematuria  Skin: No rash, lesions, ulcerations MSK:  No joint pain or swelling.  No decreased range of motion.  No back pain. Neuro: No dizziness or lightheadedness.  Psych: No depression or anxiety. Mood stable.     Physical Exam:  BP 110/69 (BP Location: Right Arm, Patient Position: Sitting, Cuff Size: Normal)   Pulse 90   Temp (!) 97.1 F (36.2 C) (Temporal)   Ht 5\' 10"  (1.778 m)   Wt 199 lb 3.2 oz (90.4 kg)   SpO2 96%   BMI 28.58 kg/m   GEN: Pleasant, interactive, well-appearing; in no acute  distress HEENT:  Normocephalic and atraumatic. PERRLA. Sclera white. Nasal turbinates pink, moist and patent bilaterally. No rhinorrhea present. Oropharynx pink and moist, without exudate or edema. No lesions, ulcerations, or postnasal drip.  NECK:  Supple w/ fair ROM. No JVD present. Normal carotid impulses w/o bruits. Thyroid symmetrical with no goiter or nodules palpated. No lymphadenopathy.   CV: RRR, no m/r/g, dependent BLE edema, non pitting. Pulses intact, +2 bilaterally. No cyanosis, pallor or clubbing. PULMONARY:  Unlabored, regular breathing. Bibasilar crackles L>R otherwise clear bilaterally A&P w/o wheezes/rales/rhonchi. No accessory muscle use.  GI: BS present and normoactive. Soft, non-tender to palpation. No organomegaly or masses detected.  MSK: No erythema, warmth or tenderness. Cap refil <2 sec all extrem. No deformities or joint swelling noted.  Neuro: A/Ox3. No focal deficits noted.   Skin: Warm, no lesions or rashe Psych: Normal affect and behavior. Judgement and thought content appropriate.     Lab Results:  CBC    Component Value Date/Time   WBC 8.2 05/19/2023 1335   RBC 4.41 05/19/2023 1335   HGB 13.0 05/19/2023 1335   HGB 11.9 (L) 10/05/2021 1429   HGB 12.8 (L) 11/18/2020 1608   HCT 39.9 05/19/2023 1335   PLT 396.0 05/19/2023 1335   PLT 322 10/05/2021 1429   MCV 90.4 05/19/2023 1335   MCH 29.6 03/24/2023 1501   MCHC 32.5 05/19/2023 1335   RDW 15.4 05/19/2023 1335   LYMPHSABS 0.1 (L) 05/19/2023 1335   MONOABS 0.9 05/19/2023 1335   EOSABS 0.1 05/19/2023 1335   BASOSABS 0.1 05/19/2023 1335    BMET    Component Value Date/Time   NA 129 (L) 05/19/2023 1335   NA 134 01/25/2022 1416   K 5.0 05/19/2023 1335   CL 95 (L) 05/19/2023 1335   CO2 27 05/19/2023 1335   GLUCOSE 216 (H) 05/19/2023 1335   BUN 11 05/19/2023 1335   BUN 10 01/25/2022 1416  CREATININE 0.71 05/19/2023 1335   CREATININE 0.62 (L) 03/24/2023 1502   CALCIUM 9.1 05/19/2023 1335    GFRNONAA >60 01/07/2023 1246   GFRNONAA >60 10/05/2021 1429   GFRNONAA 84 12/04/2020 1523   GFRAA 98 12/04/2020 1523    BNP    Component Value Date/Time   BNP 82.0 07/17/2021 0508     Imaging:  DG Ribs Unilateral W/Chest Right  Result Date: 05/19/2023 CLINICAL DATA:  Right rib pain after fall EXAM: RIGHT RIBS AND CHEST - 5 VIEW COMPARISON:  10/20/2022 chest radiograph FINDINGS: No displaced fracture or other bone lesions are seen involving the ribs. No pneumothorax or pleural effusion. No focal pulmonary opacity. Normal cardiac and mediastinal contours. Status post posterior thoracolumbar fixation, with redemonstrated fractures of the rods bilaterally. Redemonstrated screw fragments in the mid to upper thoracic spine IMPRESSION: 1. No displaced rib fracture. Please note that radiographs can be insensitive for nondisplaced rib fractures. 2. No acute cardiopulmonary process. 3. Status post posterior thoracolumbar fixation, with redemonstrated fractures of the rods bilaterally. Electronically Signed   By: Wiliam Ke M.D.   On: 05/19/2023 16:14    Administration History     None          Latest Ref Rng & Units 02/01/2023   10:39 AM 11/10/2022   11:18 AM 08/02/2022   12:08 PM 03/24/2022    9:14 AM 12/30/2021   10:56 AM 12/08/2020   12:01 PM  PFT Results  FVC-Pre L 3.13  3.23  3.12  2.88  3.27  2.97   FVC-Predicted Pre % 76  82  75  69  79  71   FVC-Post L      2.96   FVC-Predicted Post %      71   Pre FEV1/FVC % % 79  76  78  78  76  66   Post FEV1/FCV % %      70   FEV1-Pre L 2.49  2.46  2.43  2.23  2.49  1.97   FEV1-Predicted Pre % 85  88  82  75  84  65   FEV1-Post L      2.07   DLCO uncorrected ml/min/mmHg 13.59  15.50  14.29  12.95  15.89  18.01   DLCO UNC% % 55  65  57  52  64  72   DLCO corrected ml/min/mmHg 14.58  16.46  14.29  14.05  17.38  20.52   DLCO COR %Predicted % 59  69  57  56  70  82   DLVA Predicted % 83  101  87  84  91  120   TLC L      5.04   TLC %  Predicted %      71   RV % Predicted %      95     Lab Results  Component Value Date   NITRICOXIDE 20 12/30/2021        Assessment & Plan:      IPF Intolerant of antifibrotics. Previously clinically stable up until the last three weeks with worsening dyspnea over the past three weeks without cough, fever, or hemoptysis. No desaturations on room air. Recent CXR without acute process. Likely due to increased airway inflammation from recent viral illness or allergy exacerbation. Low suspicion for pulmonary embolism given consistent Coumadin use. No evidence of anemia on recent testing. There is no superimposed infection on imaging and no infectious symptoms so would not recommend abx at this  time. Not visibly dyspneic on exam. He is able to talk in complete sentences without any difficulties. Discussed increasing prednisone dose with taper then resuming daily low dose 5 mg. Close follow up. Strict ED precautions.  - Prednisone taper then resume 5 mg daily  - Use Flonase two sprays each nostril daily for one week - Continue trelegy 1 puff daily. brush tongue and rinse mouth afterwards - Continue albuterol PRN SOB/wheeze - Continue Mucinex DM - Check oxygen levels during a walk - Schedule repeat lung function testing upon return to assess for disease progression  Allergic Rhinitis Chronic sinus congestion and nasal drainage with recent acute worsening, potentially contributing to dyspnea. Symptoms may be exacerbated by seasonal allergies. No evidence of bacterial coinfection. Encouraged to continue daily antihistamine. Add daily Flonase. - Use Flonase two sprays each nostril daily until symptoms improve - Continue daily antihistamine   Peripheral Edema Chronic leg swelling; possible due to pulmonary hypertension secondary to interstitial lung disease. Discussed potential need for diuretics if labs indicate volume overload and possible echo. - Check BNP/BMET - Consider diuretics if labs  indicate volume overload  COPD See above. Moderate obstruction. Continue triple therapy regimen. Action plan in place. - Continue Trelegy 1 puff daily. Brush tongue and  Chronic respiratory failure  No exertional hypoxia. Advised to monitor at home for goal >88-90%.   Hx of PE A Fib Admits to adherence with chronic anticoagulation therapy without any breaks. No s/s of DVT. Wells low probability. Low suspicion for recurrent PE causing current symptoms. In regular rhythm today  - Continue coumadin as prescribed by cardiology  - Follow up with cardiology as schedule   Follow-up - Follow-up appointment in a few weeks to reassess symptoms and lung function.        Advised if symptoms do not improve or worsen, to please contact office for sooner follow up or seek emergency care.   I spent 45 minutes of dedicated to the care of this patient on the date of this encounter to include pre-visit review of records, face-to-face time with the patient discussing conditions above, post visit ordering of testing, clinical documentation with the electronic health record, making appropriate referrals as documented, and communicating necessary findings to members of the patients care team.  Noemi Chapel, NP 06/01/2023  Pt aware and understands NP's role.

## 2023-05-31 NOTE — Patient Instructions (Addendum)
Continue Albuterol inhaler 2 puffs every 6 hours as needed for shortness of breath or wheezing. Notify if symptoms persist despite rescue inhaler/neb use.  Continue Trelegy 1 puff daily. Brush tongue and rinse mouth afterwards Restart Flonase nasal spray 2 sprays each nostril daily Continue daily allergy pill Continue pantoprazole 1 tab daily Continue coumadin as directed by your heart doctor Continue supplemental oxygen as needed and at night for goal >88-90%  Prednisone taper. 4 tabs for 3 days, then 3 tabs for 3 days, 2 tabs for 3 days, then 1 tab for 3 days, then return to 5 mg daily. Take in AM with food Continue guafenesin DM over the counter  Labs today   Follow up in 4-6 weeks after 30 min PFT with Dr. Marchelle Gearing (ILD f/u slot) or Katie Tyrelle Raczka,NP. If symptoms do not improve or worsen, please contact office for sooner follow up or seek emergency care.

## 2023-06-01 ENCOUNTER — Encounter: Payer: Self-pay | Admitting: Nurse Practitioner

## 2023-06-01 DIAGNOSIS — J309 Allergic rhinitis, unspecified: Secondary | ICD-10-CM | POA: Insufficient documentation

## 2023-06-01 DIAGNOSIS — Z86711 Personal history of pulmonary embolism: Secondary | ICD-10-CM | POA: Insufficient documentation

## 2023-06-01 LAB — BASIC METABOLIC PANEL
BUN: 12 mg/dL (ref 6–23)
CO2: 25 meq/L (ref 19–32)
Calcium: 9.3 mg/dL (ref 8.4–10.5)
Chloride: 91 meq/L — ABNORMAL LOW (ref 96–112)
Creatinine, Ser: 0.74 mg/dL (ref 0.40–1.50)
GFR: 86.02 mL/min (ref >60.00)
Glucose, Bld: 197 mg/dL — ABNORMAL HIGH (ref 70–99)
Potassium: 4.7 meq/L (ref 3.5–5.1)
Sodium: 124 meq/L — ABNORMAL LOW (ref 135–145)

## 2023-06-01 LAB — BRAIN NATRIURETIC PEPTIDE: Pro B Natriuretic peptide (BNP): 40 pg/mL (ref 0.0–100.0)

## 2023-06-02 ENCOUNTER — Other Ambulatory Visit: Payer: Self-pay | Admitting: Family Medicine

## 2023-06-06 ENCOUNTER — Encounter: Payer: Self-pay | Admitting: Family Medicine

## 2023-06-06 ENCOUNTER — Other Ambulatory Visit: Payer: Self-pay | Admitting: Family Medicine

## 2023-06-08 DIAGNOSIS — M961 Postlaminectomy syndrome, not elsewhere classified: Secondary | ICD-10-CM | POA: Diagnosis not present

## 2023-06-08 DIAGNOSIS — E1142 Type 2 diabetes mellitus with diabetic polyneuropathy: Secondary | ICD-10-CM | POA: Diagnosis not present

## 2023-06-08 DIAGNOSIS — G894 Chronic pain syndrome: Secondary | ICD-10-CM | POA: Diagnosis not present

## 2023-06-08 DIAGNOSIS — M150 Primary generalized (osteo)arthritis: Secondary | ICD-10-CM | POA: Diagnosis not present

## 2023-06-18 ENCOUNTER — Encounter: Payer: Self-pay | Admitting: Family Medicine

## 2023-06-21 ENCOUNTER — Encounter: Payer: Self-pay | Admitting: Internal Medicine

## 2023-06-21 ENCOUNTER — Ambulatory Visit: Payer: Medicare Other | Attending: Internal Medicine | Admitting: Internal Medicine

## 2023-06-21 VITALS — BP 92/58 | HR 84 | Resp 16 | Ht 68.5 in | Wt 195.0 lb

## 2023-06-21 DIAGNOSIS — M353 Polymyalgia rheumatica: Secondary | ICD-10-CM

## 2023-06-21 DIAGNOSIS — M48061 Spinal stenosis, lumbar region without neurogenic claudication: Secondary | ICD-10-CM

## 2023-06-21 NOTE — Progress Notes (Signed)
Office Visit Note  Patient: Luis Brown             Date of Birth: 02-Apr-1946           MRN: [id number]             PCP: Bradd Canary, MD Referring: Bradd Canary, MD Visit Date: 06/21/2023 Occupation: Retired, Surveyor, mining  Subjective:  New Patient (Initial Visit) (Patient states his pain comes and goes. Patient states he gets pain in his wrists, hands, and shoulders. Patient states he does have pain in his arm muscles. Patient states he does have neuropathy and he has taken prednisone. Patient states he is not as bad as he was. )   Discussed the use of AI scribe software for clinical note transcription with the patient, who gave verbal consent to proceed.  History of Present Illness   Luis Brown is a 77 y.o. male here for evaluation of pain in multiple joints with increased inflammation concern for PMR. He has with a history of lung disease with COPD and more recent diagnosis IPF followed with Dr. Marchelle Brown. He has had previous right shoulder rotator cuff surgery and multilevel back surgeries for DDD and stenosis, with prior complication of osteomyelitis. He presents with complaints of joint pain and stiffness, particularly in the shoulders, hands, and wrists. The symptoms have been ongoing for several months, with a severe episode first occurring approximately three to four months ago. The patient describes waking up one morning with intense pain and stiffness in the shoulders, arms, and hands, likening the sensation to an electrical shock. This episode was temporarily alleviated by  The patient has been on a maintenance dose of prednisone (5mg  daily) for lung disease. He recently completed a tapered course of prednisone due to a flare-up of his lung condition. The patient also has a history of neuropathy, which contributes to his instability and difficulty walking. corticosteroid injections in the shoulders.  The patient also reports occasional hip discomfort,  which he attributes to age and physical activity. He has a history of falls, with the most recent one occurring about a month ago due to stepping in a hole. This fall resulted in widespread soreness, particularly in the chest, which resolved over a period of two weeks.  The patient has a history of working in Surveyor, mining, which involved significant use of his hands and fingers. He also has a history of back surgery with hardware placement, which has since broken. The patient reports occasional back pain, which he attributes to physical activity and age.   Activities of Daily Living:  Patient reports morning stiffness for 0 minute.   Patient Denies nocturnal pain.  Difficulty dressing/grooming: Denies Difficulty climbing stairs: Reports Difficulty getting out of chair: Reports Difficulty using hands for taps, buttons, cutlery, and/or writing: Reports  Review of Systems  Constitutional:  Positive for fatigue.  HENT:  Positive for mouth dryness. Negative for mouth sores.   Eyes:  Negative for dryness.  Respiratory:  Positive for shortness of breath.   Cardiovascular:  Negative for chest pain and palpitations.  Gastrointestinal:  Negative for blood in stool, constipation and diarrhea.  Endocrine: Positive for increased urination.  Genitourinary:  Negative for involuntary urination.  Musculoskeletal:  Positive for joint pain, gait problem, joint pain, myalgias, muscle weakness, muscle tenderness and myalgias. Negative for joint swelling and morning stiffness.  Skin:  Positive for rash. Negative for color change, hair loss and sensitivity to sunlight.  Allergic/Immunologic:  Negative for susceptible to infections.  Neurological:  Negative for dizziness and headaches.  Hematological:  Negative for swollen glands.  Psychiatric/Behavioral:  Positive for depressed mood and sleep disturbance. The patient is not nervous/anxious.     PMFS History:  Patient Active Problem List    Diagnosis Date Noted   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   History of pulmonary embolus (PE) 06/01/2023   Allergic rhinitis 06/01/2023   Leukocytosis 02/21/2023   Anemia 09/04/2022   Hypocalcemia 09/04/2022   Secondary hypercoagulable state (HCC) 03/16/2022   IPF (idiopathic pulmonary fibrosis) (HCC) 12/30/2021   Atrial fibrillation (HCC) 09/10/2021   Long term (current) use of anticoagulants 09/10/2021   Mixed hyperlipidemia 09/10/2021   Aspiration pneumonitis (HCC) 08/07/2021   Chronic respiratory failure with hypoxia (HCC) 08/07/2021   Pulmonary emboli (HCC) 07/16/2021   Bronchiectasis with acute exacerbation (HCC) 07/16/2021   History of DVT (deep vein thrombosis) 07/15/2021   Paroxysmal A-fib (HCC) 07/15/2021   COPD (chronic obstructive pulmonary disease) (HCC) 02/10/2021   Palpitations 02/01/2021   Wears glasses 01/11/2021   Pneumothorax, right 12/15/2020   Anxiety 12/01/2020   SOB (shortness of breath)    Nocturnal hypoxia 11/14/2020   S/P bronchoscopy 09/14/2020   Gastric ulcer 01/27/2020   H/O colonoscopy with polypectomy 01/27/2020   Protein-calorie malnutrition, severe 01/09/2020   Abdominal pain 12/30/2019   Weight loss, abnormal 12/30/2019   Change in bowel habits 12/30/2019   Medication monitoring encounter 08/26/2019   Peripheral neuropathy 08/07/2019   Hyponatremia 05/05/2019   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   Fusion of spine of thoracolumbar region 03/12/2019   Status post thoracic spinal fusion 02/12/2019   Thoracic spinal stenosis 04/17/2017   Painful orthopaedic hardware (HCC) 04/17/2017   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 (HCC) 04/17/2017   Low back pain radiating to both legs 01/16/2017   Esophageal reflux 02/10/2015   Essential hypertension 06/25/2014   Asthma 06/25/2014   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   Overweight 06/25/2014    Other abnormal glucose 06/25/2014   Spinal stenosis of lumbar region without neurogenic claudication 04/09/2014   Flatback syndrome 04/09/2014   Chicken pox 10/21/2013   History of colonic polyps 10/27/2012   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   Benign enlargement of prostate 04/23/2012   ED (erectile dysfunction) 04/23/2012   Allergy 10/25/2011   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   Esophageal reflux 02/10/2015   Fatigue    HTN (hypertension)    Hyperglycemia    preDM    Hyperlipidemia    Insomnia    Low back pain radiating to both legs 01/16/2017   Measles as a child   Overweight(278.02)    Peripheral neuropathy 08/07/2019  Personal history of colonic polyps 10/27/2012   Follows with [facility]   Pneumonia    " walking"   Preventative health care    Testosterone deficiency 05/23/2012   Wears glasses     Family History  Problem Relation Age of Onset   Hypertension Mother    Diabetes Mother        type 2   Cancer Mother 39       breast in remission   Emphysema Father        smoker   COPD Father        smoker   Stroke Father 63       mini   Heart disease Father    Hypertension Sister    Hyperlipidemia Sister    Scoliosis Sister    Osteoporosis Sister    Hypertension Maternal Grandmother    Scoliosis  Maternal Grandmother    Heart disease Maternal Grandfather    Heart disease Paternal Grandfather        smoker   Heart disease Daughter    Past Surgical History:  Procedure Laterality Date   ATRIAL FIBRILLATION ABLATION N/A 01/27/2022   Procedure: ATRIAL FIBRILLATION ABLATION;  Surgeon: Regan Lemming, MD;  Location: [facility];  Service: Cardiovascular;  Laterality: N/A;   BACK SURGERY  2012 and 1994   Dr Charlesetta Garibaldi, screws and cage in low back   BIOPSY  01/09/2020   Procedure: BIOPSY;  Surgeon: Kathi Der, MD;  Location: [facility];  Service: Gastroenterology;;   BRONCHIAL BIOPSY  11/17/2020   Procedure: BRONCHIAL BIOPSIES;  Surgeon: Tomma Lightning, MD;  Location: [facility];  Service: Endoscopy;;   BRONCHIAL BRUSHINGS  11/17/2020   Procedure: BRONCHIAL BRUSHINGS;  Surgeon: Tomma Lightning, MD;  Location: [facility];  Service: Endoscopy;;   BRONCHIAL WASHINGS  11/17/2020   Procedure: BRONCHIAL WASHINGS;  Surgeon: Tomma Lightning, MD;  Location: [facility];  Service: Endoscopy;;   COLONOSCOPY WITH PROPOFOL N/A 01/09/2020   Procedure: COLONOSCOPY WITH PROPOFOL;  Surgeon: Kathi Der, MD;  Location: [facility];  Service: Gastroenterology;  Laterality: N/A;   ESOPHAGOGASTRODUODENOSCOPY (EGD) WITH PROPOFOL N/A 01/09/2020   Procedure: ESOPHAGOGASTRODUODENOSCOPY (EGD) WITH PROPOFOL;  Surgeon: Kathi Der, MD;  Location: [facility];  Service: Gastroenterology;  Laterality: N/A;   EYE SURGERY Bilateral    2016   HARDWARE REVISION  03/12/2019   REVISION OF HARDWARE THORACIC TEN-THORACIC ELEVEN WITH APPLICATION OF ADDITIONAL RODS AND ROD SLEEVES THORACIC EIGHT-THORACIC TWELVE 12-LUMBAR ONE (N/A )   HEMOSTASIS CONTROL  11/17/2020   Procedure: HEMOSTASIS CONTROL;  Surgeon: Tomma Lightning, MD;  Location: [facility];  Service: Endoscopy;;   IR US GUIDE BX ASP/DRAIN  11/13/2020   LAMINECTOMY  02/12/2019   DECOMPRESSIVE LAMINECTOMY THORACIC NINE-THORACIC  TEN AND THORACIC TEN-THORACIC ELEVEN, EXTENSION OF THORACOLUMBAR FUSION FROM THORACIC TEN TO THORACIC FIVE, LOCAL BONE GRAFT, ALLOGRAFT AND VIVIGEN (N/A)   POLYPECTOMY  01/09/2020   Procedure: POLYPECTOMY;  Surgeon: Kathi Der, MD;  Location: [facility];  Service: Gastroenterology;;   REMOVAL OF RODS AND PEDICLE SCREWS T5 AND T6, EXPLORATION OF FUSION, BIOPSY TRANSPEDICULAR T5 AND T6 (N/A )  09/14/2020   SPINAL FUSION N/A 02/12/2019   Procedure: DECOMPRESSIVE LAMINECTOMY THORACIC NINE-THORACIC TEN AND THORACIC TEN-THORACIC ELEVEN, EXTENSION OF THORACOLUMBAR FUSION FROM THORACIC TEN TO THORACIC FIVE, LOCAL BONE GRAFT, ALLOGRAFT AND VIVIGEN;  Surgeon: Kerrin Champagne, MD;  Location: [facility];  Service: Orthopedics;  Laterality: N/A;  DECOMPRESSIVE LAMINECTOMY THORACIC NINE-THORACIC TEN AND THORACIC TEN-THORACIC ELEVEN, EXTENSION  OF TH   SPINAL FUSION N/A 03/12/2019   Procedure: REVISION OF HARDWARE THORACIC TEN-THORACIC ELEVEN WITH APPLICATION OF ADDITIONAL RODS AND ROD SLEEVES THORACIC EIGHT-THORACIC TWELVE 12-LUMBAR ONE;  Surgeon: Kerrin Champagne, MD;  Location: [facility];  Service: Orthopedics;  Laterality: N/A;   TONSILLECTOMY     as child   torn rotator cuff  2010   right   VIDEO BRONCHOSCOPY N/A 11/17/2020   Procedure: VIDEO BRONCHOSCOPY WITH FLUORO;  Surgeon: Tomma Lightning, MD;  Location: [facility];  Service: Endoscopy;  Laterality: N/A;   Social History   Social History Narrative   Not on file   Immunization History  Administered Date(s) Administered   Fluad Quad(high Dose 65+) 04/05/2019, 06/29/2020, 05/18/2021, 04/22/2022   Influenza Split 04/23/2012   Influenza Whole 02/16/2011   Influenza, High Dose Seasonal PF 05/06/2013, 03/25/2016, 05/05/2017, 06/07/2018   Influenza-Unspecified 03/26/2023   PFIZER(Purple Top)SARS-COV-2 Vaccination 08/23/2019, 09/13/2019, 06/07/2020   Pfizer Covid-19 Vaccine Bivalent Booster 67yrs & up 04/29/2021   Pfizer(Comirnaty)Fall Seasonal Vaccine 12  years and older 05/23/2022, 05/25/2023   Pneumococcal Conjugate-13 03/25/2016   Pneumococcal Polysaccharide-23 01/20/2009, 10/19/2011   Respiratory Syncytial Virus Vaccine,Recomb Aduvanted(Arexvy) 04/09/2022   Td 07/09/2003, 07/18/2008   Td (Adult) 07/09/2003, 07/18/2008   Tdap 07/20/2011   Zoster Recombinant(Shingrix) 05/25/2023   Zoster, Live 04/27/2016     Objective: Vital Signs: BP (!) 92/58 (BP Location: Right Arm, Patient Position: Sitting, Cuff Size: Normal)   Pulse 84   Resp 16   Ht 5' 8.5" (1.74 m)   Wt 195 lb (88.5 kg)   BMI 29.22 kg/m    Physical Exam Eyes:     Conjunctiva/sclera: Conjunctivae normal.  Cardiovascular:     Rate and Rhythm: Normal rate and regular rhythm.  Pulmonary:     Effort: Pulmonary effort is normal.     Breath sounds: Normal breath sounds.  Musculoskeletal:     Comments: 1+ pitting edema RLE, trace on left ankle, wearing compression socks  Lymphadenopathy:     Cervical: No cervical adenopathy.  Skin:    General: Skin is warm and dry.  Neurological:     Mental Status: He is alert.  Psychiatric:        Mood and Affect: Mood normal.      Musculoskeletal Exam:  Shoulders full ROM no tenderness or swelling Elbows full ROM no tenderness or swelling Bony nodule changes throughout fingers, right wrist slightly decreased ROM, no palpable swelling Decreased hip internal rotation ROM b/l, mild lateral hip and low back pain with movement Knees full ROM no tenderness or swelling, patellofemoral crepitus   Physical Exam   EXTREMITIES: Edema present in legs, more pronounced on one side. MUSCULOSKELETAL: Right wrist exhibits slightly restricted mobility compared to the left wrist. Shoulders able to reach up overhead, back behind, and down low without difficulty. Restricted hip internal rotation and inflexibility noted, with preserved external rotation. Weakness in hip flexors, requiring use of adductor muscles for leg elevation. 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       Investigation: No additional findings.  Imaging: No results found.  Recent Labs: Lab Results  Component Value Date   WBC 8.2 05/19/2023   HGB 13.0 05/19/2023   PLT 396.0 05/19/2023   NA 124 (L) 05/31/2023   K 4.7 05/31/2023   CL 91 (L) 05/31/2023   CO2 25 05/31/2023   GLUCOSE 197 (H) 05/31/2023   BUN 12 05/31/2023   CREATININE 0.74 05/31/2023   BILITOT 0.7 05/19/2023   ALKPHOS 108 05/19/2023  AST 20 05/19/2023   ALT 14 05/19/2023   PROT 5.8 (L) 05/19/2023   ALBUMIN 4.0 05/19/2023   CALCIUM 9.3 05/31/2023   GFRAA 98 12/04/2020   QFTBGOLDPLUS Negative 11/18/2020    Speciality Comments: No specialty comments available.  Procedures:  No procedures performed Allergies: Daptomycin, Diltiazem, Ofev [nintedanib], and Pirfenidone   Assessment / Plan:     Visit Diagnoses: Polymyalgia rheumatica (HCC) - Plan: Sedimentation rate, C-reactive protein, Cyclic citrul peptide antibody, IgG     Joint Pain and Stiffness Severe pain and stiffness in shoulders, hands, wrists, and hips, with a history of rotator cuff surgery. Recent severe flare-up in August/September, currently at baseline. Possible polymyalgia rheumatica (PMR) given age, symptoms, and previous high inflammation markers. Currently on low-dose prednisone (5mg  daily) for lung condition, which may be suppressing inflammation. -Continue current prednisone regimen. -Consider additional treatments if symptoms flare up again. -Check inflammatory markers in blood tests today. -Provide handout on supplements and treatments for osteoarthritis.  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 -Consider physical therapy for gait training and muscle conditioning to reduce fall risk, dependent on patient motivation and commitment.  Pulmonary Fibrosis On maintenance prednisone (5mg  daily). Recent flare-up managed with a tapered dose of prednisone. -Continue current  prednisone regimen.  Orders: Orders Placed This Encounter  Procedures   Sedimentation rate   C-reactive protein   Cyclic citrul peptide antibody, IgG   No orders of the defined types were placed in this encounter.    Follow-Up Instructions: Return if symptoms worsen or fail to improve.   Fuller Plan, MD  Note - This record has been created using [employer].  Chart creation errors have been sought, but may not always  have been located. Such creation errors do not reflect on  the standard of medical care.

## 2023-06-23 LAB — C-REACTIVE PROTEIN: CRP: 16.8 mg/L — ABNORMAL HIGH (ref <8.0)

## 2023-06-23 LAB — CYCLIC CITRUL PEPTIDE ANTIBODY, IGG: Cyclic Citrullin Peptide Ab: <16 U

## 2023-06-23 LAB — SEDIMENTATION RATE: Sed Rate: 6 mm/h (ref 0–20)

## 2023-06-27 ENCOUNTER — Ambulatory Visit (HOSPITAL_BASED_OUTPATIENT_CLINIC_OR_DEPARTMENT_OTHER): Payer: Medicare Other | Admitting: Internal Medicine

## 2023-06-27 DIAGNOSIS — J84112 Idiopathic pulmonary fibrosis: Secondary | ICD-10-CM

## 2023-06-27 LAB — PULMONARY FUNCTION TEST
DL/VA % pred: 81 %
DL/VA: 3.2 ml/min/mmHg/L
DLCO cor % pred: 58 %
DLCO cor: 14.03 ml/min/mmHg
DLCO unc % pred: 56 %
DLCO unc: 13.36 ml/min/mmHg
FEF 25-75 Pre: 2.1 L/s
FEF2575-%Pred-Pre: 108 %
FEV1-%Pred-Pre: 86 %
FEV1-Pre: 2.41 L
FEV1FVC-%Pred-Pre: 110 %
FEV6-%Pred-Pre: 82 %
FEV6-Pre: 3.02 L
FEV6FVC-%Pred-Pre: 106 %
FVC-%Pred-Pre: 77 %
FVC-Pre: 3.03 L
Pre FEV1/FVC ratio: 80 %
Pre FEV6/FVC Ratio: 99 %

## 2023-06-27 NOTE — Patient Instructions (Signed)
 Continue Albuterol inhaler 2 puffs every 6 hours as needed for shortness of breath or wheezing. Notify if symptoms persist despite rescue inhaler/neb use.  Continue Trelegy 1 puff daily. Brush tongue and rinse mouth afterwards Restart Flonase nasal spray 2 sprays each nostril daily Continue daily allergy pill Continue pantoprazole 1 tab daily Continue coumadin as directed by your heart doctor Continue supplemental oxygen as needed and at night for goal >88-90%  Prednisone taper. 4 tabs for 3 days, then 3 tabs for 3 days, 2 tabs for 3 days, then 1 tab for 3 days, then return to 5 mg daily. Take in AM with food Continue guafenesin DM over the counter  Labs today   Follow up in 4-6 weeks after 30 min PFT with Luis Brown (ILD f/u slot) or Luis Tyrelle Raczka,NP. If symptoms do not improve or worsen, please contact office for sooner follow up or seek emergency care.

## 2023-06-27 NOTE — Progress Notes (Unsigned)
Referring provider: Marisue Brooklyn  HPI: 77 year old male, former smoker (20-pack-year history).  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   Previous [facility] pulmonary encounters:  12/29/20- Dr. Marchelle Gearing Chief Complaint  Patient presents with   Follow-up    PFT  performed 5/24.  Pt states since last visit, he has been doing okay. States he has lost some weight that he has noticed since last visit.    Follow-up drug-induced pneumonitis daptomycin from early May 2022  HPI Luis Brown 77 y.o. -returns for follow-up.  I personally saw him in the hospital on 11/20/2020.  At that point in time he had been started on Solu-Medrol on 11/18/2020.  When I saw him he was on day 3 of Solu-Medrol and he was needing 3 L nasal cannula oxygen at rest.  At that point in time even with 3 L he was desaturating to 86% just talking.  Put him on prednisone 60 mg/day.  After that he see nurse practitioner x2 at least 1 of which was a telephone visit..  As part of his follow-up on 12/11/2020 he had a CT scan of the chest [3 days after pulmonary function test] and on my personal visualization it showed new onset pneumothorax but significant improvement in infiltrates and inflammation.  He had another follow-up chest x-ray 12/17/2020 in which no pneumothorax reported [similar on 12/15/2020] and pulmonary infiltrates appear improved.  He is now here with his wife  He tells me he is now on 10 mg/day prednisone he just darted that today.  He is 1 week left and after that the prednisone taper ends.  Because of the pneumothorax is physical therapy has been on hold but 4 days ago he was evaluated and apparently his pulse ox was 100% but this was on oxygen.  He continues to use his  oxygen although he is not sure if he is desaturating anymore.  He does feel overall improved compared to 6 weeks ago but still has significant amount of fatigue.  He has significant insomnia which he believes is due to prednisone.  Today's last night was the first night he could sleep when he dropped his prednisone down to 10 mg/day.  He believes his fatigue is multifactorial related to the disease, multiple medications, lack of oxygen, insomnia etc.  02/09/2021 Patient presents today for 1 month follow-up. Since his last visit in June, he has established with cardiology and was dx with afib. He is currently on Xarelto and Digoxin. His wife states that he has had more energy recently. He is scheduled for testosterone injection tomorrow. He gets winded with stairs and heavy exertion. He reports intermittent wheezing. He needs refill of albuterol rescue inhaler. He has weaned himself of oxygen and O2 equipment has been picked up. He has been sleeping better at night. Anxiety has improved, he stopped xanax. Due for follow-up CXR today. 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  02/23/2021 Patient presents today for 2 week follow-up. During his last visit he was given trial Spiriva Respimat d/t dyspnea /wheezing symptoms and moderate obstruction seen on his PFTs. He has been using sample of Spiriva Respimat 2.16mcg as prescribed, he occasionally misses a dose for 1-2 days. He states that his wheezing has stopped but he has not noticed a significant difference in  dyspnea symptoms. His ILD scale appears improved from two weeks go. He has having some muscle spasms in his back that started before being placed on LABA.   04/26/2021 -   Chief Complaint  Patient presents with   Follow-up    F/u, trelegy medication concerns    Luis Brown 77 y.o. -returns for follow-up.  Since I last saw him he seen nurse practitioner 2 times.  Symptom scores for  dyspnea continue to improve.  He was also walked for oxygen desaturation February 04, 2019 did not desaturate.  He says he continues to get better.  He is off prednisone.  Nurse practitioner noticed obstructive physiology on the pulmonary function test.  Put him on inhaler.  Currently is on Trelegy.  He says it helps him tremendously.  He feels it opens up his lungs.  He declined his flu shot today because he will have it after the COVID-vaccine in October or November 2022.   06/24/2021- Interim hx  Patient presents today for acute OV/cough. Patient reports increased shortness of breath since 06/13/21 with associated fatigue, cough and chest tightness. Cough is productive with clear mucus. Symptoms started after Thanksgiving. His wife had the flu at the same time. His cough initially improved while taking mucinex, he stopped since he was starting to feel better. Denies f/c/s, purulent mucus, N/V/D. CAT score 17.   Inpatient consult note 07/17/2022 by Dr. Marchelle Gearing at [facility] This is a 77 year old male with poorly controlled diabetes, hemoglobin A1c 7.4, chronic opioid use for back pain, BPH on Flomax, anxiety and depression, paroxysmal A. fib on metoprolol digoxin and Xarelto .  He had long hx of back infection and was on daptomycin.  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  Was treated with steroids.  Course complicated by right upper extremity DVT secondary to PICC line for which she was on Xarelto.  He is requiring oxygen at that time.  By the summer 2022 he was off oxygen and able to ambulate even without oxygen.  Course was also complicated later in May 2022 with spontaneous pneumothorax.  By the summer 2022 he was able to come off prednisone.  He also had diagnosed of atrial fibrillation for which he was on digoxin and Xarelto was continued [although originally started for DVT].  Last seen in clinic October  2022 at which time he was off oxygen and off steroids.  He was on Trelegy inhaler for pulmonary function test that back in May 2022 showed obstructive physiology.  He says he was feeling better.  He is only minimally symptomatic.  Plan at that time was to get a repeat high-resolution CT chest done in the spring 2023.   On Thanksgiving 2022 his wife had the flu and he was exposed to this and after that he started having increasing cough and fatigue.  Up until this point he was able to do walking and fishing.  He saw  pulm nurse practitioner 06/24/2021 in the pulmonary clinic.  Was given 8-day prednisone taper for presumed diagnosis of COPD exacerbation.  No antibiotic.  He did see infectious diseases at the same time and for his back infections it was noted, "10 days of linezolid + 8 weeks of tedizolid for his spinal infection and has now been started on dalbavancin infusions every 2 weeks last month as a suppressive regim" (none of which appear to be associated with drug-induced ILD]  He then called into the pulmonary office on 07/13/2021 felt he was desaturating mildly and coughing up green mucus and fevers..  2 COVID home test were negative.  He had a fever of 100.7.  He wanted some prednisone.  We gave him some Omnicef as well and a 5-day prednisone taper but also advised that to report to the ER.  He then presented on 07/15/2021 for admission with acute hypoxic respiratory failure 58% and a history of 1 week of cough and shortness of breath getting worse.  Appears he might not have taken Omnicef.  He had lactic acidosis 2.3 at admission, white count of nearly 15,000 with lymphopenia.  CT angiogram showed acute PE segmental and subsegmental right-sided [despite being on Xarelto] started on IV heparin started on Solu-Medrol and antibiotics . CT also showed L >>> R (opposted from may 2022) GGO. and .  His Doppler ultrasound lower extremities negative for DVT.   On 07/16/2021 he was stable on 4 L oxygen.   His flu, multiplex PCR and MRSA PCR were negative.  Procalcitonin 0.21.  Echo is normal with normal RV.  Then on 12/3 1/22 seen progressively more hypoxemic now requiring 8 L of oxygen.  Briefly required even more but looking comfortable.  When assisted to go to the bathroom even on 8 L he desaturated to 70s he has been afebrile since admission. Trop improved from 200  prior day -> 70, lactic 1.9 with normal BNP. CXR with ?> Worsening LEft sided air space disease and ccm consulted      OV 08/12/2021  Subjective:  [name], male , DOB: 01-30-1946 , age 20 y.o. , MRN: [id number] , ADDRESS: [address] [place] [address] PCP Esperanza Richters, PA-C Patient Care Team: Saguier, Kateri Mc as PCP - General (Internal Medicine) Thomasene Ripple, DO as PCP - Cardiology (Cardiology) Marzella Schlein., MD (Ophthalmology) Kerrin Champagne, MD as Consulting Physician (Orthopedic Surgery)  This Provider for this visit: Treatment Team:  Attending Provider: Kalman Shan, MD    08/12/2021 -   Chief Complaint  Patient presents with   Hospitalization Follow-up    Breathing has improved some but not back to baseline yet. He has some chest congestion and occ cough with clear to yellow sputum.    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  He also has paroxysmal A. fib, low testosterone, chronic hyponatremia, chronic opioid intake.,  Chronic Lyrica  HPI YONATAN ZOTTOLA 77 y.o. -returns for office follow-up.  After last saw him in the office I did actually see him New Year's Eve in the hospital with acute on chronic hypoxemic respiratory failure at this admission he had a new diagnosis of pulmonary embolism that was deemed Xarelto failure [he was on Xarelto for DVT in his upper extremity from spring 2022.  Today I learned that he is actually taking testosterone for some years.  When he had the DVT in the spring 2022 after the discharge he was  on testosterone.  He says he took testosterone right up to the admission.  He is now getting subcutaneous Lovenox injections.  He wants to go to Coumadin because has been deemed as Xarelto failure.  Also prior to that admission he did have some infectious symptoms with colored sputum.  At the time of that admission he denies any aspiration.  He was discharged home on oxygen.  He was discharged on a prednisone taper.  Also discharged on oxygen [of note  he was on 10 L oxygen during the hospital stay]  At home he continued on oxygen was doing the prednisone taper but got readmitted 08/07/2021 for 1 day and discharged the following day 08/08/2021 following aspiration of a pill.  He says he has never had any aspiration.  He also and his wife also states that speech eval for swallow in the hospital was normal.  He denies any other choking episodes for pills or for food.  He was treated with antibiotics.  This time at discharge he was only on 2 L nasal cannula.  He was given another prednisone taper.  He tells me that on 14 August 2021 he will go to 30 mg/day for 10 days and then 20 mg daily for 10 days and then 10 mg daily for 10 days and stop   Walking desaturation test today on room air: At room air at rest his pulse ox was 95% for at least 20 minutes.  Then we decided to walk him 185 feet x 3 laps on room air:.  He only walked 1 lap and his pulse ox went down to 86% on room air and stopped.  His heart rate went up to 122/min.  His most recent chest x-ray was 5 days ago on 08/07/2021 at the time of the hospitalization second time for aspirating pill: This is improved compared to the admission 1.   His current symptom score is listed below.    OV 09/28/2021  Subjective:  [name], male , DOB: 1945-09-29 , age 47 y.o. , MRN: [id number] , ADDRESS: [address] [place] [address] PCP Esperanza Richters, PA-C Patient Care Team: Saguier, Kateri Mc as PCP - General (Internal  Medicine) Thomasene Ripple, DO as PCP - Cardiology (Cardiology) Marzella Schlein., MD (Ophthalmology) Kerrin Champagne, MD as Consulting Physician (Orthopedic Surgery)  This Provider for this visit: Treatment Team:  Attending Provider: Kalman Shan, MD    09/28/2021 -   Chief Complaint  Patient presents with   Follow-up    Pt states he still becomes SOB with exertion and also has an occasional cough. States the cough happens throughout the day and will get some phlegm up that is clear to yellow tinge.   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 And PE with "ILD Falare" Dec 2022/Jan 2023   He also has paroxysmal A. fib, low testosterone, chronic hyponatremia, chronic opioid intake.,  Chronic Lyrica  HPI Luis Brown 77 y.o. -returns for follow-up.  Since his last visit he has continued on prednisone 10 mg/day.  He continues to have shortness of breath.  In fact this is unchanged.  He does tell me though starting spring season he has had allergic rhinitis.  Wife states he has seasonal allergies for many years.  He is also having worsening cough.  He took a antihistamine and is somewhat better.  He continues to schedule Stiolto.  He has upcoming atrial fibrillation ablation end of March 2023.  Also in mid February 2023 had another CT angiogram chest that ruled out pulmonary embolism [done while on anticoagulation].  I personally visualized the CT chest and compared to late December 2023/early January 2023 the findings are better.  There might be some residual scarring and potential emphysema.  We walked him today and his walking desaturation test held up.  He did not desaturate.  There is a huge improvement.  Of note he was supposed have pulmonary function test today but  this was not performed.  He does not know why.  Apologized for any missteps.    For his pulmonary embolism: It appears that he might not be taking his testosterone shots  anymore.  Regarding seasonal allergies: He has not had work-up for this.  But currently it is active.     HPI: 77 year old male, former smoker (20-pack-year history).  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   Previous [facility] pulmonary encounters:  12/29/20- Dr. Marchelle Gearing Chief Complaint  Patient presents with   Follow-up    PFT  performed 5/24.  Pt states since last visit, he has been doing okay. States he has lost some weight that he has noticed since last visit.    Follow-up drug-induced pneumonitis daptomycin from early May 2022  HPI Luis Brown 77 y.o. -returns for follow-up.  I personally saw him in the hospital on 11/20/2020.  At that point in time he had been started on Solu-Medrol on 11/18/2020.  When I saw him he was on day 3 of Solu-Medrol and he was needing 3 L nasal cannula oxygen at rest.  At that point in time even with 3 L he was desaturating to 86% just talking.  Put him on prednisone 60 mg/day.  After that he see nurse practitioner x2 at least 1 of which was a telephone visit..  As part of his follow-up on 12/11/2020 he had a CT scan of the chest [3 days after pulmonary function test] and on my personal visualization it showed new onset pneumothorax but significant improvement in infiltrates and inflammation.  He had another follow-up chest x-ray 12/17/2020 in which no pneumothorax reported [similar on 12/15/2020] and pulmonary infiltrates appear improved.  He is now here with his wife  He tells me he is now on 10 mg/day prednisone he just darted that today.  He is 1 week left and after that the prednisone taper ends.  Because of the pneumothorax is physical therapy has been on hold but 4 days ago he was evaluated and apparently his pulse ox was  100% but this was on oxygen.  He continues to use his oxygen although he is not sure if he is desaturating anymore.  He does feel overall improved compared to 6 weeks ago but still has significant amount of fatigue.  He has significant insomnia which he believes is due to prednisone.  Today's last night was the first night he could sleep when he dropped his prednisone down to 10 mg/day.  He believes his fatigue is multifactorial related to the disease, multiple medications, lack of oxygen, insomnia etc.  02/09/2021 Patient presents today for 1 month follow-up. Since his last visit in June, he has established with cardiology and was dx with afib. He is currently on Xarelto and Digoxin. His wife states that he has had more energy recently. He is scheduled for testosterone injection tomorrow. He gets winded with stairs and heavy exertion. He reports intermittent wheezing. He needs refill of albuterol rescue inhaler. He has weaned himself of oxygen and O2 equipment has been picked up. He has been sleeping better at night. Anxiety has improved, he stopped xanax. Due for follow-up CXR today. 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  02/23/2021 Patient presents today for 2 week follow-up. During his last visit he was given trial Spiriva Respimat  d/t dyspnea /wheezing symptoms and moderate obstruction seen on his PFTs. He has been using sample of Spiriva Respimat 2.16mcg as prescribed, he occasionally misses a dose for 1-2 days. He states that his wheezing has stopped but he has not noticed a significant difference in dyspnea symptoms. His ILD scale appears improved from two weeks go. He has having some muscle spasms in his back that started before being placed on LABA.   04/26/2021 -   Chief Complaint  Patient presents with   Follow-up    F/u, trelegy medication concerns    Luis Brown 77 y.o. -returns for follow-up.  Since I last saw him he seen  nurse practitioner 2 times.  Symptom scores for dyspnea continue to improve.  He was also walked for oxygen desaturation February 04, 2019 did not desaturate.  He says he continues to get better.  He is off prednisone.  Nurse practitioner noticed obstructive physiology on the pulmonary function test.  Put him on inhaler.  Currently is on Trelegy.  He says it helps him tremendously.  He feels it opens up his lungs.  He declined his flu shot today because he will have it after the COVID-vaccine in October or November 2022.  06/24/2021 Patient presents today for acute OV/cough. Patient reports increased shortness of breath since 06/13/21 with associated fatigue, cough and chest tightness. Cough is productive with clear mucus. Symptoms started after Thanksgiving. His wife had the flu at the same time. His cough initially improved while taking mucinex, he stopped since he was starting to feel better. Denies f/c/s, purulent mucus, N/V/D. CAT score 17.    09/28/2021 -   Chief Complaint  Patient presents with   Follow-up    Pt states he still becomes SOB with exertion and also has an occasional cough. States the cough happens throughout the day and will get some phlegm up that is clear to yellow tinge.   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 And PE with "ILD Falare" Dec 2022/Jan 2023   He also has paroxysmal A. fib, low testosterone, chronic hyponatremia, chronic opioid intake.,  Chronic Lyrica  HPI Luis Brown 77 y.o. -returns for follow-up.  Since his last visit he has continued on prednisone 10 mg/day.  He continues to have shortness of breath.  In fact this is unchanged.  He does tell me though starting spring season he has had allergic rhinitis.  Wife states he has seasonal allergies for many years.  He is also having worsening cough.  He took a antihistamine and is somewhat better.  He continues to schedule Stiolto.  He has upcoming atrial fibrillation  ablation end of March 2023.  Also in mid February 2023 had another CT angiogram chest that ruled out pulmonary embolism [done while on anticoagulation].  I personally visualized the CT chest and compared to late December 2023/early January 2023 the findings are better.  There might be some residual scarring and potential emphysema.  We walked him today and his walking desaturation test held up.  He did not desaturate.  There is a huge improvement.  Of note he was supposed have pulmonary function test today but this was not performed.  He does not know why.  Apologized for any missteps.    For his pulmonary embolism: It appears that he might not be taking his testosterone shots anymore.  Regarding seasonal allergies: He has not had work-up for this.  But currently it is active.   12/30/2021 Patient  presents today for a 3 month follow-up with PFTs. Patient needs simple walk test, symptom scale and FENO today.  Patient is maintained on Stiolto. Has not required albuterol. He has chronic shortness of breath and fatigue symptoms which are significantly better since last visit. No cough. He remains on 10mg  prednisone daily. No oxygen reqiuirments during daytime. Using 1L oxygen at bedtime.  HRCT on 12/28/2021 showed regression of subpleural and basilar predominant fibrotic interstitial lung disease compared to May 2022.  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 PFTs showed improvement in FEV1 but decreased diffusion capacity. Eosinophil absolute was 100 and March.  Respiratory allergy panel was negative. FENO today was normal. Discussed starting antifibrtoic to slow progression of pulmonary fibrosis.   02/10/2022 Patient presents today for 1 month follow-up/ new antifibrotic start. 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 He started Esbriet 1.5 weeks ago d/t gout flare. He is tolerating medication well, currently on 2 tablets three times a day. No GI symptoms. Dyspnea appears some better overall. No acute respiratory  symptoms. No active cough, mucinex-dm helps when needed. He took 3/4 mild walk the other day without issues. Continues on Stiolto, prn albuterol and 5mg  prednisone daily. Overnight oximetry test on 01/24/22 showed that patient spent 5 min with SPO2 less than 88%. SPO2 low 85% and baseline 93%. Patient started on 1L supplemental oxygen at bedtime.   03/24/2022- Interim hx  Patient presents today 2 month follow-up/ ILD. 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 Patient had spirometry with DLCO testing today. He was unable to tolerate antifibrotic medication Esbriet d/t nausea, headache and muscle aches. He was never able to get to full dose. He had labs checked with PCP yesterday, LFTs were normal. Sodium was low and needs recheck in 1 week   He reports increased sob symptoms last couple of weeks. He has associated wheezing and morning sputum production. He takes mucinex first thing in the morning which helps. He in on 5mg  prednisone daily. Spirometry showed decline in FEV1 and diffusion capacity compared to June.   Pulmonary testing 03/24/2022 Spirometry>> FVC 2.88 (69%), FEV1 2.23 (75%), ratio 78, DLCOcor 14.05 (56%)     hx OV 05/19/2022  Subjective:  [name], male , DOB: [birth year] , age 52 y.o. , MRN: [id number] , ADDRESS: [address] [place] [address] PCP Bradd Canary, MD Patient Care Team: Bradd Canary, MD as PCP - General (Family Medicine) Thomasene Ripple, DO as PCP - Cardiology (Cardiology) Regan Lemming, MD as PCP - Electrophysiology (Cardiology) Marzella Schlein., MD (Ophthalmology) Kerrin Champagne, MD as Consulting Physician (Orthopedic Surgery)  This Provider for this visit: Treatment Team:  Attending Provider: Kalman Shan, MD   05/19/2022 -   Chief Complaint  Patient presents with   Follow-up    Pt states that he still has problems with SOB which is about the same since last visit.     HPI Luis Brown 77 y.o. -returns for follow-up.   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  It is now looking more more that he has IPF but if not IPF progressive phenotype.  The UIP pattern is a bad prognostic sign.  He is feeling stable but his pulmonary function test today shows decline.  We tried him on pirfenidone but he did not tolerated.  He stated given body aches.  Now for the last  1 week he is on full dose nintedanib.  He is having some mild fatigue but no diarrhea.  Overall he feels he is tolerating the medicine well.  Last liver function test end of September 2023 before he went on the Internet.  He will have liver function test today.  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  He is also still on testosterone but he is on anticoagulation.  I did warn him about the risks of pulmonary embolism with testosterone he is already had pulm embolism.  He has had his RSV and flu vaccine and he plans to have his COVID mRNA booster.    06/20/2022- interim hx  Patient presents today for follow-up after starting anti-fibrotic medication.  He feels his breathing  is some worse over the last 4-5 months. When walking at his own pace he does all right.  Increased dyspnea symptoms with quick exertion.  Overall he does okay shopping but takes this time.  Not coughing much, prn mucinex helps loosen congestion. Mainly clears his throat d/t PND. He was started on OFEV in early November, currently taking 150mg  twice daily. He is tolerating medication except for fatigue. Fatigue is worse on some days.  Not sleeping well at night requiring naps.  He has had previous sleep study which was negative for OSA. Maintained on Trelegy and wears oxygen at bedtime. He is taking Prednisone 5mg  daily. Liver function within normal limits in November. He is on BuSpar for anxiety which is helping. He takes oxycodone 6 times daily  for chronic back pain, following with pain management. He is scheduled for CT chest on 07/13/22 and PFTs in January to re-assess lung function. He has an overview with Dr. Marchelle Gearing on 08/02/21.       OV 08/02/2022  Subjective:  [name], male , DOB: 10/04/1945 , age 49 y.o. , MRN: [id number] , ADDRESS: [address] [address] PCP Bradd Canary, MD Patient Care Team: Bradd Canary, MD as PCP - General (Family Medicine) Thomasene Ripple, DO as PCP - Cardiology (Cardiology) Regan Lemming, MD as PCP - Electrophysiology (Cardiology) Marzella Schlein., MD (Ophthalmology) Kerrin Champagne, MD (Inactive) as Consulting Physician (Orthopedic Surgery)  This Provider for this visit: Treatment Team:  Attending Provider: Kalman Shan, MD    08/02/2022 -   Chief Complaint  Patient presents with   Follow-up    PFT     HPI Luis Brown 77 y.o. -returns for follow-up.  Continue on prednisone 5 mg/day.  He is also on nintedanib.  He and his wife report nintedanib is $3100 per month and is quite expensive.  They are not able to get a lower co-pay.  Last it was $600 per month co-pay.  He feels fatigue because of nintedanib.  It is mild to moderate.  He is willing to pulm rehabilitation.  He does have a long back scar and has had previous back surgery but he feels he can handle pulmonary rehabilitation.  He is clear that his fatigue is because of the nintedanib but he does not want to stop it.  We reviewed his PFTs and CT scan and shows recent stability.  Therefore he is pleased about it and does not want to stop the nintedanib.  He is willing to put up with the fatigue.  He was wondering if his shortness of breath is getting worse because he thought because of the  shortness of breath and ongoing cough and fatigue that he might be worse.  But his symptom score shows stability.  [See below]  Of note his wife is also here with him.  She also is worried about  his high co-pay.  She is also reporting independently that his fatigue is related to nintedanib.  She also believes that he can do pulm rehabilitation and in fact was pleased that offered it.   OV 11/10/2022  Subjective:  [name], male , DOB: [birth year] , age 11 y.o. , MRN: [id number] , ADDRESS: [address] [place] [address] PCP Bradd Canary, MD Patient Care Team: Bradd Canary, MD as PCP - General (Family Medicine) Thomasene Ripple, DO as PCP - Cardiology (Cardiology) Regan Lemming, MD as PCP - Electrophysiology (Cardiology) Marzella Schlein., MD (Ophthalmology) Kerrin Champagne, MD (Inactive) as Consulting Physician (Orthopedic Surgery)  This Provider for this visit: Treatment Team:  Attending Provider: Kalman Shan, MD    11/10/2022 -   Chief Complaint  Patient presents with   Follow-up    Review PFT today.  Stopped Ofev x 3 weeks due to increased fatigue      HPI DAQUAWN HAGEDORN 77 y.o. -returns for follow-up.  Presents with his wife.  His dyspnea itself is stable.  Pulmonary function test is improved/stable.  He stopped taking nintedanib 3 weeks ago because of severe fatigue ECOG 4.  Was lying in bed all the time.  Now after stopping the Internet his fatigue is improved and is back to baseline.  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  We discussed low-dose nintedanib about taking 100 mg once daily for a week and then escalating to twice daily.  He is willing to do this.  We took a shared decision making to commit to this approach.  He will have liver function test today.   At the last visit we discussed pulmonary rehabilitation but he does not have an appointment there.  Will make a rereferral today.    OV 02/02/2023  Subjective:  [name], male , DOB: June 07, 1946 , age 74 y.o. , MRN: [id number] , ADDRESS: [address] [place]  [address] PCP Bradd Canary, MD Patient Care Team: Bradd Canary, MD as PCP - General (Family Medicine) Thomasene Ripple, DO as PCP - Cardiology (Cardiology) Regan Lemming, MD as PCP - Electrophysiology (Cardiology) Marzella Schlein., MD (Ophthalmology) Kerrin Champagne, MD (Inactive) as Consulting Physician (Orthopedic Surgery)  This Provider for this visit: Treatment Team:  Attending Provider: Kalman Shan, MD  02/02/2023 -   Chief Complaint  Patient presents with   Follow-up    F/u after PFT.  Breathing about the same.     HPI Luis Brown 77 y.o.   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  Presents with his wife.  Wife is independent historian.  From pulmonary standpoint he is reporting stability.  Pulmonary function test actually stable.  Since his last visit he has stopped nintedanib.  This because at the last visit he complained of severe fatigue with nintedanib we gave a holiday and rechallenge with low-dose but the fatigue came back so he stopped it.  He is feeling much better.  The fatigue itself is better.  At the time he is not on any antifibrotic treatment or specific treatment for this.  Other issues - Therapies for  IPF: Currently no other therapies available.  We discussed about clinical trials as a care option.  He was interested at this in a phone call.  We went over several aspects of clinical trials [see documented below].  He is interested.  At this point in time we resolved that we will prescreen him and look at different options.  And then based on this consider study and given the choice.  He and his wife are agreeable with this plan.  -Spring 2022 DVT and pulmonary embolism December 2022: He developed this in the setting of testosterone but testosterone is a risk factor was not recognized till a year later in December 2023.  At the time he got a pulm embolism in December 2022 it was felt it was Xarelto failure because he was on Xarelto for his atrial  fibrillation.  He has since been on Coumadin with INR monitoring.  However he has been off his testosterone for at least 6 months now.  Will check a D-dimer.  Perhaps if he gets to 2-year mark which is end of this calendar year then we can consider going back to Xarelto.  -After he stopped nintedanib in June 2024 he ended up in the ER.  This was on January 07, 2023 based on chart review.  Gallstones were been diagnosed.  He did have abnormal LFTs.  Primary care is monitoring the situation  -He was also in the ER December 24, 2022 and is followed up with primary care.  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  But is also in the ER complaint of joint pain and fatigue.  There is a history of tick bite on this ER visit.  Lyme and [facility] spotted fever antibodies are negative according to chart review.  Repeat rheumatoid factor was negative.  Has appointment upcoming with rheumatology later in December 2024.  He is on a prednisone taper.  Currently on 20 mg.  But he will come off this and go back to his baseline 5 mg  -Chronic prednisone use: 5 mg/day.  I think this was started because of the concern for drug-induced ILD.  At some point we have to stop this.  Currently is on a short course prednisone burst.  We will can work on tapering it at the time of next visit if he is stable.  -Clinical trial concepts discussed  05/31/2023: Today - acute Discussed the use of AI scribe software for clinical note transcription with the patient, who gave verbal consent to proceed.  History of Present Illness   Mr. Crabbe, a patient with a history of interstitial lung disease, presents with an increase in respiratory symptoms over the past three weeks. He reports feeling unwell and experiencing a marked deterioration in breathing that he believes started after a cold. He does still have some increased sinus drainage, which is clear. No facial tenderness, headaches. He denies any associated cough, fever,  chills, or hemoptysis. He has been using Mucinex DM to manage mucus production and an albuterol rescue inhaler, which provides temporary relief. He also still uses Trelegy once a day. He takes a daily allergy pill. He has flonase but does not use it regularly. He has noticed an increase in leg swelling, which is a chronic issue due to advanced neuropathy. He also reports a recent weight gain, which he attributes to a diet high in sweets and low in protein.  He also reports using supplemental oxygen at night and occasionally during the  day due to increased breathlessness. Despite this, his home oxygen saturation levels remain satisfactory, with the lowest recorded level being 96%.  He denies any missed doses of Coumadin and reports no new medications.   He also reports a decrease in physical activity due to worsening respiratory symptoms, including difficulty walking the dogs and performing household chores. He denies any wheezing, except when outside for prolonged periods.  He had a chest x-ray with his PCP following a fall and subsequent rib surgery, which did not show any signs of pneumonia.       OV 06/28/2023  Subjective:  [name], male , DOB: 06-Jun-1946 , age 77 y.o. 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   -Started on prednisone and as of December 2024 was on 5 mg/day. - And PE with "ILD Falare" Dec 2022/Jan 2023  ->  diagnostic revision to likely IPF June 2023 after cT with progression since may 2022 on prob UIP pattern and early [facility] - Rx  - esberit and stopped summer 2023: /t nausea, headache and muscle aches.   - severe fatigue with ofev - despite rechallenge and low dose :spring 2024 and stopped may 2024  -Declined clinical trial participation in fall 2024.  Hx of DVT sprin 2022 - testosterone History of pulmonary embolism July 15, 2021 (in setting of  testosterone)  -Developed while being on Xarelto for atrial fibrillation and deemed as Xarelto failure and started on Coumadin   - chronic testosterone defcicently - on testosterone replacement therapy -as of 2016 and stopped end of 2023/early 2024 following history of DVTs and pulmonary embolism  -not on this as of April 2024 and July 2024  -  He also has paroxysmal A. fib, low testosterone, chronic hyponatremia, chronic opioid intake.,  Chronic Lyrica   - on coumadin   -ABNormal abnormal liver enzymes June 2024 after stopping nintedanib  -Right upper quadrant ultrasound with gallstones; emergency room visit 01/07/2023   Anxiety and depression on buspirone and oxycodone/acetaminophen  Tick bite June 2024 at [facility] spotted fever and also Lyme antibodies are negative Arthralgia of the right hand June 2024  - : Repeat rheumatoid factor negative.  Improvement with prednisone -Primary care is referred to rheumatology.   Diagnosis of polymyalgia rheumatica given 06/21/2023 by Dr. Sheliah Hatch  -Original prednisone for drug-induced ILD was supported to continue.  06/28/2023 -   Chief Complaint  Patient presents with   Follow-up    Increased DOE x 2 months. He gets winded walking from one end of the house to the other.      HPI Luis Brown 77 y.o. -returns for follow-up.  He presents with his wife.  She is an independent historian.  They both attest that he is having more progressive shortness of breath but if you look at  the symptom score below it is the same.  Nevertheless he feels that he is declining.  He says he only has bad days and not so bad days.  He never has a good day anymore.  After the last acute visit with nurse practitioner the prednisone made him feel somewhat better but then he is back to feeling miserable with multiple symptoms.  But shortness of breath even though he is saying its worst is the same.  He did have pulmonary function testing and it shows a slight steady decline over time but is not dramatic worsening.  His last CT scan of the chest was a year ago.  Since his last visit no emergency room visits or hospitalizations but he did establish with Dr. Sheliah Hatch yesterday.  Dr. Dimple Casey is a rheumatologist.  Diagnose of polymyalgia rheumatica was given.  Dr. Dimple Casey felt he should continue the prednisone.  I confirmed this based on external medical record review.  He wanted no treatment options for his progressive worsening of pulmonary function testing and concern for worsening ILD.  We went over the 2 antifibrotic's again.  He has tried nintedanib 2 times with significant side effects.  He only tried pirfenidone 1 time and had nausea and other side effects.  He is willing to rechallenge himself with this but at low-dose protocol.  We took a shared decision making to do this particularly given the fact now he is more removed from his original hospitalization.  We discussed clinical trials as a care option but he is not interested.  We discussed the following facts about pirfenidone  - Esbriet/Pirfenidone requires intensive drug monitoring due to high concerns for Adverse effects of , including  Drug Induced Liver Injury, significant GI side effects that include but not limited to Diarrhea, Nausea, Vomiting,  and other system side effects that include Fatigue, headaches, weight loss and other side effects such as skin rash. These will be monitored with  blood work such as LFT initially once a month for 6  months and then quarterly  - Liver function test was normal in November 2024.   SYMPTOM SCALE - ILD 08/12/2021 09/28/2021  12/30/2021  02/10/2022  03/24/2022  05/19/2022  06/20/2022  08/02/2022  11/10/2022  06/28/2023   Current weight   177lb  182 lbs 187# ofev x 1 week Ofev 150 bid Ofev 150 bid 173#. OFf ofev x 2 weeks due to fagite Not on any antifibrotic  O2 use 2L [place] but at rest he was 95%  RA RA  RA  ra RA     Shortness of Breath 0 -> 5 scale with 5 being worst (score 6 If unable to do)           At rest 1 1 0 0 0 1 2 1 1 0   Simple tasks - showers, clothes change, eating, shaving 3 2 0 0 0 1 2 1 1 1   Household (dishes, doing bed, laundry) 3 2 1  0 3 1 2  1.5 2 2   Shopping xno 3 1 0 3 2 2  1.5 3 2   Walking level at own pace 2 1.5 0 0 0 2 1 1 2 2   Walking up Stairs 3 2.5 2 1 4 3 2  1.5 2 3   Total (30-36) Dyspnea Score 12 12 4 1 10 10 11  7/5 12 10   How bad is your cough? Not often 3.5 Not often 0 1 0 2 0 1 0  How bad is your fatigue A lot 2 2 2.5 1 2 3  frequent 2 3  How bad is nausea 0 0 0 0 0 0 0 0 0 3   How bad is vomiting?  0  0 0 0 0 0 0 0 0 0  How bad is diarrhea? 0 0 0 0 0 0 0 0 0 0   How bad is anxiety? some 1 0 0 0 3 3.5 - 4 On Buspar 1 3 3   How bad is depression A little 0 0.5 0 0 3 1 1 1 2   Any chronic pain - if so where and how bad x  Back  Back  Back  Back and neuopathy Back pain - oxycodone 5mg  / following with pain management  Controleld back pain on med x    Simple office walk 185 feet x  3 laps goal with forehead probe 08/12/2021  09/28/2021  12/30/2021  06/20/2022  11/10/2022   O2 used ra ra RA RA   Number laps completed 1  3 3  laps    Comments about pace steady  Steady Steady    Resting Pulse Ox/HR 95% and 89/min 99% and HR 90 97%; HR 97  100%; HR 77   Final Pulse Ox/HR 86% and 122/min 94% and HR 114 98%; HR 120 100%; 76%   Desaturated </= 88% no no No No   Desaturated <= 3% points yes Yes, 5 ponts No No   Got Tachycardic >/= 90/min yes yes Yes No   Symptoms at end of  test dyspnea Mild does None None   Miscellaneous comments x improve x x       PFT     Latest Ref Rng & Units 06/27/2023    1:24 PM 02/01/2023   10:39 AM 11/10/2022   11:18 AM 08/02/2022   12:08 PM 03/24/2022    9:14 AM 12/30/2021   10:56 AM 12/08/2020   12:01 PM  ILD indicators  FVC-Pre L 3.03  P 3.13  3.23  3.12  2.88  3.27  2.97   FVC-Predicted Pre % 77  P 76  82  75  69  79  71   FVC-Post L       2.96   FVC-Predicted Post %       71   TLC L       5.04   TLC Predicted %       71   DLCO uncorrected ml/min/mmHg 13.36  P 13.59  15.50  14.29  12.95  15.89  18.01   DLCO UNC %Pred % 56  P 55  65  57  52  64  72   DLCO Corrected ml/min/mmHg 14.03  P 14.58  16.46  14.29  14.05  17.38  20.52   DLCO COR %Pred % 58  P 59  69  57  56  70  82     P Preliminary result      LAB RESULTS last 96 hours No results found.  LAB RESULTS last 90 days Recent Results (from the past 2160 hour(s))  POCT INR     Status: None   Collection Time: 04/07/23 11:16 AM  Result Value Ref Range   INR 2.3 2.0 - 3.0   POC INR    CBC w/Diff     Status: Abnormal   Collection Time: 05/19/23  1:35 PM  Result Value Ref Range   WBC 8.2 4.0 - 10.5 K/uL   RBC 4.41 4.22 - 5.81 Mil/uL   Hemoglobin 13.0 13.0 - 17.0 g/dL   HCT 64.4 03.4 - 74.2 %   MCV 90.4 78.0 - 100.0 fl   MCHC 32.5 30.0 - 36.0 g/dL   RDW 59.5 63.8 - 75.6 %  Platelets 396.0 150.0 - 400.0 K/uL   Neutrophils Relative % 85.6 Repeated and verified X2. (H) 43.0 - 77.0 %   Lymphocytes Relative 1.4 Repeated and verified X2. (L) 12.0 - 46.0 %   Monocytes Relative 11.0 3.0 - 12.0 %   Eosinophils Relative 1.4 0.0 - 5.0 %   Basophils Relative 0.6 0.0 - 3.0 %   Neutro Abs 7.0 1.4 - 7.7 K/uL   Lymphs Abs 0.1 (L) 0.7 - 4.0 K/uL   Monocytes Absolute 0.9 0.1 - 1.0 K/uL   Eosinophils Absolute 0.1 0.0 - 0.7 K/uL   Basophils Absolute 0.1 0.0 - 0.1 K/uL  Comp Met (CMET)     Status: Abnormal   Collection Time: 05/19/23  1:35 PM  Result Value Ref Range    Sodium 129 (L) 135 - 145 mEq/L   Potassium 5.0 3.5 - 5.1 mEq/L   Chloride 95 (L) 96 - 112 mEq/L   CO2 27 19 - 32 mEq/L   Glucose, Bld 216 (H) 70 - 99 mg/dL   BUN 11 6 - 23 mg/dL   Creatinine, Ser 6.29 0.40 - 1.50 mg/dL   Total Bilirubin 0.7 0.2 - 1.2 mg/dL   Alkaline Phosphatase 108 39 - 117 U/L   AST 20 0 - 37 U/L   ALT 14 0 - 53 U/L   Total Protein 5.8 (L) 6.0 - 8.3 g/dL   Albumin 4.0 3.5 - 5.2 g/dL   GFR 52.84 >13.24 mL/min    Comment: Calculated using the CKD-EPI Creatinine Equation (2021)   Calcium 9.1 8.4 - 10.5 mg/dL  Iron     Status: None   Collection Time: 05/19/23  1:35 PM  Result Value Ref Range   Iron 72 42 - 165 ug/dL  Sedimentation rate     Status: None   Collection Time: 05/19/23  1:35 PM  Result Value Ref Range   Sed Rate 4 0 - 20 mm/hr  C-reactive protein     Status: None   Collection Time: 05/19/23  1:35 PM  Result Value Ref Range   CRP 1.4 0.5 - 20.0 mg/dL  M01     Status: None   Collection Time: 05/19/23  1:35 PM  Result Value Ref Range   Vitamin B-12 525 211 - 911 pg/mL  Vitamin B1     Status: Abnormal   Collection Time: 05/19/23  1:35 PM  Result Value Ref Range   Vitamin B1 (Thiamine) 66 (H) 8 - 30 nmol/L    Comment: (Note) Vitamin supplementation within 24 hours prior to blood  draw may affect the accuracy of the results. . This test was developed and its analytical performance  characteristics have been determined by [employer]. It has not been cleared or approved by FDA.  This assay has been validated pursuant to the CLIA  regulations and is used for clinical purposes. . [facility] [address] [place] [address] [phone] Junita Push L. Thompson Caul, MD, PhD   TSH     Status: None   Collection Time: 05/19/23  1:35 PM  Result Value Ref Range   TSH 2.49 0.35 - 5.50 uIU/mL  Hemoglobin A1c     Status: Abnormal   Collection Time: 05/19/23  1:35 PM  Result Value Ref Range   Hgb A1c MFr Bld 8.8 (H) 4.6  - 6.5 %    Comment: Glycemic Control Guidelines for People with Diabetes:Non Diabetic:  <6%Goal of Therapy: <7%Additional Action Suggested:  >8%   B Nat  Peptide     Status: None   Collection Time: 05/31/23  3:34 PM  Result Value Ref Range   Pro B Natriuretic peptide (BNP) 40.0 0.0 - 100.0 pg/mL  Basic Metabolic Panel (BMET)     Status: Abnormal   Collection Time: 05/31/23  3:34 PM  Result Value Ref Range   Sodium 124 (L) 135 - 145 mEq/L   Potassium 4.7 3.5 - 5.1 mEq/L   Chloride 91 (L) 96 - 112 mEq/L   CO2 25 19 - 32 mEq/L   Glucose, Bld 197 (H) 70 - 99 mg/dL   BUN 12 6 - 23 mg/dL   Creatinine, Ser 7.89 0.40 - 1.50 mg/dL   GFR 38.10 >17.51 mL/min    Comment: Calculated using the CKD-EPI Creatinine Equation (2021)   Calcium 9.3 8.4 - 10.5 mg/dL  Sedimentation rate     Status: None   Collection Time: 06/21/23  9:23 AM  Result Value Ref Range   Sed Rate 6 0 - 20 mm/h  C-reactive protein     Status: Abnormal   Collection Time: 06/21/23  9:23 AM  Result Value Ref Range   CRP 16.8 (H) <8.0 mg/L  Cyclic citrul peptide antibody, IgG     Status: None   Collection Time: 06/21/23  9:23 AM  Result Value Ref Range   Cyclic Citrullin Peptide Ab <02 UNITS    Comment: Reference Range Negative:            <20 Weak Positive:       20-39 Moderate Positive:   40-59 Strong Positive:     >59 .   Pulmonary Function Test     Status: None (Preliminary result)   Collection Time: 06/27/23  1:24 PM  Result Value Ref Range   FVC-Pre 3.03 L   FVC-%Pred-Pre 77 %   FEV1-Pre 2.41 L   FEV1-%Pred-Pre 86 %   FEV6-Pre 3.02 L   FEV6-%Pred-Pre 82 %   Pre FEV1/FVC ratio 80 %   FEV1FVC-%Pred-Pre 110 %   Pre FEV6/FVC Ratio 99 %   FEV6FVC-%Pred-Pre 106 %   FEF 25-75 Pre 2.10 L/sec   FEF2575-%Pred-Pre 108 %   DLCO unc 13.36 ml/min/mmHg   DLCO unc % pred 56 %   DLCO cor 14.03 ml/min/mmHg   DLCO cor % pred 58 %   DL/VA 5.85 ml/min/mmHg/L   DL/VA % pred 81 %         has a past medical history of  Acute pharyngitis (10/21/2013), Allergy, Anxiety and depression (10/25/2011), BPH (benign prostatic hyperplasia) (04/23/2012), Chicken pox (as a child), DDD (degenerative disc disease), DDD (degenerative disc disease), lumbosacral, Diabetes mellitus, Dyspnea, ED (erectile dysfunction) (04/23/2012), Elevated BP, 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   reports that he quit smoking about 33 years ago. His smoking use included cigarettes. He started smoking about 53 years ago. He has a 20 pack-year smoking history. He has never used smokeless tobacco.  Past Surgical History:  Procedure Laterality Date   ATRIAL FIBRILLATION ABLATION N/A 01/27/2022   Procedure: ATRIAL FIBRILLATION ABLATION;  Surgeon: Regan Lemming, MD;  Location: [facility];  Service: Cardiovascular;  Laterality: N/A;   BACK SURGERY  2012 and 1994   Dr Charlesetta Garibaldi, screws and cage in low back   BIOPSY  01/09/2020   Procedure: BIOPSY;  Surgeon: Kathi Der, MD;  Location: [facility];  Service: Gastroenterology;;   BRONCHIAL BIOPSY  11/17/2020   Procedure: BRONCHIAL BIOPSIES;  Surgeon: Tomma Lightning, MD;  Location: [facility];  Service: Endoscopy;;   BRONCHIAL BRUSHINGS  11/17/2020   Procedure: BRONCHIAL BRUSHINGS;  Surgeon: Tomma Lightning, MD;  Location: [facility];  Service: Endoscopy;;   BRONCHIAL WASHINGS  11/17/2020   Procedure: BRONCHIAL WASHINGS;  Surgeon: Tomma Lightning, MD;  Location: [facility];  Service: Endoscopy;;   COLONOSCOPY WITH PROPOFOL N/A 01/09/2020   Procedure: COLONOSCOPY WITH PROPOFOL;  Surgeon: Kathi Der, MD;  [facility];  Service: Gastroenterology;  Laterality: N/A;   ESOPHAGOGASTRODUODENOSCOPY (EGD) WITH PROPOFOL N/A 01/09/2020   Procedure: ESOPHAGOGASTRODUODENOSCOPY (EGD) WITH PROPOFOL;  Surgeon: Kathi Der, MD;  [facility];  Service: Gastroenterology;  Laterality: N/A;   EYE SURGERY Bilateral    2016   HARDWARE REVISION  03/12/2019   REVISION OF HARDWARE THORACIC TEN-THORACIC ELEVEN WITH APPLICATION OF ADDITIONAL RODS AND ROD SLEEVES THORACIC EIGHT-THORACIC TWELVE 12-LUMBAR ONE (N/A )   HEMOSTASIS CONTROL  11/17/2020   Procedure: HEMOSTASIS CONTROL;  Surgeon: Tomma Lightning, MD;  [facility];  Service: Endoscopy;;   IR US GUIDE BX ASP/DRAIN  11/13/2020   LAMINECTOMY  02/12/2019   DECOMPRESSIVE LAMINECTOMY THORACIC NINE-THORACIC TEN AND THORACIC TEN-THORACIC ELEVEN, EXTENSION OF THORACOLUMBAR FUSION FROM THORACIC TEN TO THORACIC FIVE, LOCAL BONE GRAFT, ALLOGRAFT AND VIVIGEN (N/A)   POLYPECTOMY  01/09/2020   Procedure: POLYPECTOMY;  Surgeon: Kathi Der, MD;  [facility];  Service: Gastroenterology;;   REMOVAL OF RODS AND PEDICLE SCREWS T5 AND T6, EXPLORATION OF FUSION, BIOPSY TRANSPEDICULAR T5 AND T6 (N/A )  09/14/2020   SPINAL FUSION N/A 02/12/2019   Procedure: DECOMPRESSIVE LAMINECTOMY THORACIC NINE-THORACIC TEN AND THORACIC TEN-THORACIC ELEVEN, EXTENSION OF THORACOLUMBAR FUSION FROM THORACIC TEN TO THORACIC FIVE, LOCAL BONE GRAFT, ALLOGRAFT AND VIVIGEN;  Surgeon: Kerrin Champagne, MD;  [facility];  Service: Orthopedics;  Laterality: N/A;  DECOMPRESSIVE LAMINECTOMY THORACIC NINE-THORACIC TEN AND THORACIC TEN-THORACIC ELEVEN, EXTENSION OF TH   SPINAL FUSION N/A 03/12/2019   Procedure: REVISION OF HARDWARE THORACIC TEN-THORACIC ELEVEN WITH APPLICATION OF ADDITIONAL RODS AND ROD SLEEVES THORACIC EIGHT-THORACIC TWELVE 12-LUMBAR ONE;  Surgeon: Kerrin Champagne, MD;  [facility];  Service: Orthopedics;  Laterality: N/A;   TONSILLECTOMY     as child   torn  rotator cuff  2010   right   VIDEO BRONCHOSCOPY N/A 11/17/2020   Procedure: VIDEO BRONCHOSCOPY WITH FLUORO;  Surgeon: Tomma Lightning, MD;  [facility];  Service: Endoscopy;  Laterality: N/A;    Allergies  Allergen Reactions   Daptomycin     Drug induced pneumonia    Diltiazem Other (See Comments)    Passed out   Ofev [Nintedanib] Diarrhea    fatigue   Pirfenidone Diarrhea    fatigue    Immunization History  Administered Date(s) Administered   Fluad Quad(high Dose 65+) 04/05/2019, 06/29/2020, 05/18/2021, 04/22/2022   Influenza Split 04/23/2012   Influenza Whole 02/16/2011   Influenza, High Dose Seasonal PF 05/06/2013, 03/25/2016, 05/05/2017, 06/07/2018   Influenza-Unspecified 03/26/2023   PFIZER(Purple Top)SARS-COV-2 Vaccination 08/23/2019, 09/13/2019, 06/07/2020   Pfizer Covid-19 Vaccine Bivalent Booster 71yrs & up 04/29/2021   Pfizer(Comirnaty)Fall Seasonal Vaccine 12 years and older 05/23/2022, 05/25/2023   Pneumococcal Conjugate-13 03/25/2016   Pneumococcal Polysaccharide-23 01/20/2009, 10/19/2011   Respiratory Syncytial Virus Vaccine,Recomb Aduvanted(Arexvy) 04/09/2022   Td 07/09/2003, 07/18/2008   Td (Adult)  07/09/2003, 07/18/2008   Tdap 07/20/2011   Zoster Recombinant(Shingrix) 05/25/2023   Zoster, Live 04/27/2016    Family History  Problem Relation Age of Onset   Hypertension Mother    Diabetes Mother        type 2   Cancer Mother 25       breast in remission   Emphysema Father        smoker   COPD Father        smoker   Stroke Father 14       mini   Heart disease Father    Hypertension Sister    Hyperlipidemia Sister    Scoliosis Sister    Osteoporosis Sister    Hypertension Maternal Grandmother    Scoliosis Maternal Grandmother    Heart disease Maternal Grandfather    Heart disease Paternal Grandfather        smoker   Heart disease Daughter      Current Outpatient Medications:    albuterol (PROVENTIL HFA) 108 (90 Base) MCG/ACT  inhaler, TAKE 2 PUFFS BY MOUTH EVERY 6 HOURS AS NEEDED FOR WHEEZE OR SHORTNESS OF BREATH, Disp: 8 g, Rfl: 5   atorvastatin (LIPITOR) 10 MG tablet, TAKE 1 TABLET BY MOUTH EVERY DAY, Disp: 90 tablet, Rfl: 1   busPIRone (BUSPAR) 15 MG tablet, Take 1 tablet (15 mg total) by mouth 3 (three) times daily., Disp: 270 tablet, Rfl: 1   Cyanocobalamin (VITAMIN B-12 PO), Take by mouth., Disp: , Rfl:    dextromethorphan-guaiFENesin (MUCINEX DM) 30-600 MG 12hr tablet, Take 1 tablet by mouth 2 (two) times daily., Disp: , Rfl:    famciclovir (FAMVIR) 500 MG tablet, Take 1 tablet (500 mg total) by mouth 3 (three) times daily., Disp: 21 tablet, Rfl: 0   fluticasone (FLONASE) 50 MCG/ACT nasal spray, SPRAY 2 SPRAYS INTO EACH NOSTRIL EVERY DAY, Disp: 48 mL, Rfl: 1   Fluticasone-Umeclidin-Vilant (TRELEGY ELLIPTA) 200-62.5-25 MCG/ACT AEPB, Inhale 1 puff into the lungs daily., Disp: 60 each, Rfl: 5   glucose blood (CONTOUR NEXT TEST) test strip, Check blood sugar once daily, Disp: 100 each, Rfl: 12   insulin glargine (LANTUS SOLOSTAR) 100 UNIT/ML Solostar Pen, Inject 26 Units into the skin daily., Disp: 15 mL, Rfl: 5   insulin lispro (HUMALOG) 100 UNIT/ML injection, Inject 0.03 mLs (3 Units total) into the skin 3 (three) times daily with meals., Disp: 100 mL, Rfl: 1   Insulin Pen Needle (B-D UF III MINI PEN NEEDLES) 31G X 5 MM MISC, USE WITH LANTUS, Disp: 100 each, Rfl: 3   Insulin Pen Needle 32G X 4 MM MISC, Use with insulin, Disp: 1 each, Rfl: 3   Insulin Syringe-Needle U-100 (B-D INS SYR ULTRAFINE 1CC/30G) 30G X 1/2" 1 ML MISC, USE WITH LANTUS, Disp: 200 each, Rfl: 1   Insulin Syringes, Disposable, U-100 1 ML MISC, USE WITH LANTUS, Disp: 200 each, Rfl: 2   meclizine (ANTIVERT) 12.5 MG tablet, Take 1 tablet (12.5 mg total) by mouth 3 (three) times daily as needed for dizziness., Disp: 30 tablet, Rfl: 2   metFORMIN (GLUCOPHAGE) 1000 MG tablet, TAKE 1 TABLET (1,000 MG TOTAL) BY MOUTH TWICE A DAY WITH FOOD, Disp: 180 tablet,  Rfl: 0   oxyCODONE-acetaminophen (PERCOCET/ROXICET) 5-325 MG tablet, Take 1 tablet by mouth every 4 (four) hours as needed for pain- frequency change, Disp: 180 tablet, Rfl: 0   pantoprazole (PROTONIX) 40 MG tablet, Take 1 tablet (40 mg total) by mouth 2 (two) times daily before a meal., Disp: 180  tablet, Rfl: 1   predniSONE (DELTASONE) 5 MG tablet, TAKE 1 TABLET BY MOUTH EVERY DAY, Disp: 30 tablet, Rfl: 0   pregabalin (LYRICA) 75 MG capsule, TAKE 1 CAPSULE BY MOUTH TWICE A DAY (Patient taking differently: Take 75 mg by mouth 2 (two) times daily.), Disp: 60 capsule, Rfl: 3   sitaGLIPtin (JANUVIA) 50 MG tablet, Take 1 tablet (50 mg total) by mouth daily., Disp: 90 tablet, Rfl: 1   tamsulosin (FLOMAX) 0.4 MG CAPS capsule, TAKE 2 CAPSULES BY MOUTH EVERY DAY, Disp: 180 capsule, Rfl: 1   tirzepatide (MOUNJARO) 2.5 MG/0.5ML Pen, Inject 2.5 mg into the skin once a week., Disp: 2 mL, Rfl: 1   tiZANidine (ZANAFLEX) 4 MG tablet, Take 1 and 1/2 tablets (6 mg total) by mouth every 4 hours., Disp: 180 tablet, Rfl: 2   venlafaxine XR (EFFEXOR-XR) 150 MG 24 hr capsule, Take 1 capsule (150 mg total) by mouth daily with breakfast., Disp: 90 capsule, Rfl: 1   warfarin (COUMADIN) 5 MG tablet, TAKE 1 TO 2 TABLETS DAILY OR AS PRESCRIBED BY COUMADIN CLINIC, Disp: 130 tablet, Rfl: 1   zolpidem (AMBIEN) 10 MG tablet, Take 0.5-1 tablets (5-10 mg total) by mouth at bedtime as needed for sleep., Disp: 15 tablet, Rfl: 3      Objective:   Vitals:   06/28/23 1112  BP: 126/67  Pulse: (!) 59  Temp: (!) 97.5 F (36.4 C)  TempSrc: Oral  SpO2: 97%  Weight: 198 lb 12.8 oz (90.2 kg)  Height: 5\' 9"  (1.753 m)    Estimated body mass index is 29.36 kg/m as calculated from the following:   Height as of this encounter: 5\' 9"  (1.753 m).   Weight as of this encounter: 198 lb 12.8 oz (90.2 kg).  @WEIGHTCHANGE @  Filed Weights   06/28/23 1112  Weight: 198 lb 12.8 oz (90.2 kg)     Physical Exam   General: No distress.  Looks sam O2 at rest: no Cane present: no Sitting in wheel chair: no Frail: no Obese: no Neuro: Alert and Oriented x 3. GCS 15. Speech normal Psych: Pleasant Resp:  Barrel Chest - no.  Wheeze - no, Crackles - yes some, No overt respiratory distress CVS: Normal heart sounds. Murmurs - no Ext: Stigmata of Connective Tissue Disease - no HEENT: Normal upper airway. PEERL +. 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 But Esbriet and ofev intolerant. Rediscussion and you want to do esbriet rechallenge at low dose protocol. Nomral LFT Nov 2024  Plan - cotontinue TRELEGY as before - Continue oxygen with at night and exertion > 600 feet (200 yards) - Continue prednisone  5mg  per day   - look at tapering next visit or in future -- No interest in clinical trials - start esbriet low dose protocol - do HRCT supine and prone in 3 months (last dec 2023)   History of DVT spring 2022 and  pulmonary embolism December 2022 while on xaretlo for a fib  - considerd as xarelto failure and on coumading  - stable  Plan - Coumadin for A Fib and blood clot per  PCP  and cardiology  - life long anticoagulation for clots but leave decision to cardiology  - NO  testosterone please    Follow-up  -Return to see APP in 6 weeks to see uptake with esbriet low dose protocol - [simple walking desaturation test and symptom score at follow-up] =   FOLLOWUP Return in about 6 weeks (around 08/09/2023) for 30 min visit, Chronic Respiratory Failure, with any of the APPS, Face to Face Visit.    SIGNATURE    Dr. Kalman Shan, M.D., F.C.C.P,  Pulmonary and Critical Care Medicine Staff Physician, [facility] Director - Interstitial Lung Disease  Program  Pulmonary Fibrosis [facility] Network at [facility] [place], [place], [address]  Pager: [phone], If no answer or between  15:00h - 7:00h: call [phone] Telephone: [phone]  11:44 AM 06/28/2023    Moderate Complexity MDM OFFICE  2021 E/M guidelines, first released in 2021, with minor revisions added in 2023 and 2024 Must meet the requirements for 2 out of 3 dimensions to qualify.    Number and complexity of problems addressed Amount and/or complexity of data reviewed Risk of complications and/or morbidity  One or more chronic illness with mild exacerbation, OR progression, OR  side effects of treatment  Two or more stable chronic illnesses  One undiagnosed new problem with uncertain prognosis  One acute illness with systemic symptoms   One Acute complicated injury Must meet the requirements for 1 of 3 of the categories)  Category 1: Tests and documents, historian  Any combination of 3 of the following:  Assessment requiring an independent historian  Review of prior external note(s) from each unique source  Review of results of each unique test  Ordering of each unique test    Category 2: Interpretation of tests   Independent interpretation of a test performed by another physician/other qualified health care professional (not separately reported)  Category 3: Discuss management/tests  Discussion of management or test interpretation with external physician/other qualified health care professional/appropriate source (not separately reported) Moderate risk of morbidity from additional diagnostic testing or treatment Examples only:  Prescription drug management  Decision regarding minor surgery with identfied patient or procedure risk factors  Decision regarding elective major surgery without identified patient or procedure risk factors  Diagnosis or treatment  significantly limited by social determinants of health             HIGh Complexity  OFFICE   2021 E/M guidelines, first released in 2021, with minor revisions added in 2023. Must meet the requirements for 2 out of 3 dimensions to qualify.    Number and complexity of problems addressed Amount and/or complexity of data reviewed Risk of complications and/or morbidity  Severe exacerbation of chronic illness  Acute or chronic illnesses that may pose a threat to life or bodily function, e.g., multiple trauma, acute MI, pulmonary embolus, severe respiratory distress, progressive rheumatoid arthritis, psychiatric illness with potential threat to self or others, peritonitis, acute renal failure, abrupt change in neurological status Must meet the requirements for 2 of 3 of the categories)  Category 1: Tests and documents, historian  Any combination of 3 of the following:  Assessment requiring an independent historian  Review of prior external note(s) from each unique source  Review of results of each unique test  Ordering of each unique test    Category 2: Interpretation of tests    Independent interpretation of a test performed by  another physician/other qualified health care professional (not separately reported)  Category 3: Discuss management/tests  Discussion of management or test interpretation with external physician/other qualified health care professional/appropriate source (not separately reported) - PHARMACY TEAM  HIGH risk of morbidity from additional diagnostic testing or treatment Examples only:  Drug therapy requiring intensive monitoring for toxicity  Decision for elective major surgery with identified pateint or procedure risk factors  Decision regarding hospitalization or escalation of level of care  Decision for DNR or to de-escalate care   Parenteral controlled  substances

## 2023-06-27 NOTE — Patient Instructions (Signed)
Spirometry and DLCO Performed Today.  

## 2023-06-27 NOTE — Progress Notes (Signed)
Spirometry and DLCO Performed Today.  

## 2023-06-28 ENCOUNTER — Ambulatory Visit: Payer: Medicare Other | Admitting: Internal Medicine

## 2023-06-28 ENCOUNTER — Telehealth: Payer: Self-pay | Admitting: Internal Medicine

## 2023-06-28 ENCOUNTER — Encounter: Payer: Self-pay | Admitting: Internal Medicine

## 2023-06-28 VITALS — BP 126/67 | HR 59 | Temp 97.5°F | Ht 69.0 in | Wt 198.8 lb

## 2023-06-28 DIAGNOSIS — Z86718 Personal history of other venous thrombosis and embolism: Secondary | ICD-10-CM | POA: Diagnosis not present

## 2023-06-28 DIAGNOSIS — J84112 Idiopathic pulmonary fibrosis: Secondary | ICD-10-CM | POA: Diagnosis not present

## 2023-06-28 NOTE — Telephone Encounter (Signed)
    Rx team  - restart essbriet low dose protocol     SIGNATURE    Dr. Kalman Shan, M.D., F.C.C.P,  Pulmonary and Critical Care Medicine Staff Physician, [facility] Director - Interstitial Lung Disease  Program  Pulmonary Fibrosis [facility] Network at [facility] [place], [place], [address]   Pager: [phone], If no answer  -> Check AMION or Try [phone] Telephone (clinical office): [phone] Telephone (research): [phone]  2:30 PM 06/28/2023

## 2023-06-28 NOTE — Telephone Encounter (Signed)
Submitted a Prior Authorization request to [facility] for PIRFENIDONE via CoverMyMeds. Will update once we receive a response.  Key: Laurette Schimke, PharmD, MPH, BCPS, CPP Clinical Pharmacist (Rheumatology and Pulmonology)

## 2023-06-29 ENCOUNTER — Encounter: Payer: Self-pay | Admitting: Internal Medicine

## 2023-07-03 NOTE — Telephone Encounter (Signed)
Forwarding to Rx team

## 2023-07-04 ENCOUNTER — Other Ambulatory Visit: Payer: Self-pay | Admitting: Emergency Medicine

## 2023-07-04 ENCOUNTER — Encounter: Payer: Self-pay | Admitting: Family Medicine

## 2023-07-04 DIAGNOSIS — E0865 Diabetes mellitus due to underlying condition with hyperglycemia: Secondary | ICD-10-CM

## 2023-07-04 MED ORDER — SITAGLIPTIN PHOSPHATE 50 MG PO TABS: 50.0000 mg | ORAL_TABLET | Freq: Every day | ORAL | 1 refills | Status: DC

## 2023-07-05 ENCOUNTER — Encounter: Payer: Self-pay | Admitting: Family Medicine

## 2023-07-05 ENCOUNTER — Other Ambulatory Visit: Payer: Self-pay

## 2023-07-05 ENCOUNTER — Encounter: Payer: Self-pay | Admitting: Internal Medicine

## 2023-07-05 ENCOUNTER — Other Ambulatory Visit (HOSPITAL_COMMUNITY): Payer: Self-pay

## 2023-07-05 MED ORDER — PIRFENIDONE 267 MG PO TABS
ORAL_TABLET | ORAL | 0 refills | Status: DC
  Filled 2023-07-05: qty 159, 30d supply, fill #0
  Filled 2023-07-06: qty 159, 28d supply, fill #0

## 2023-07-05 MED ORDER — PIRFENIDONE 267 MG PO TABS
534.0000 mg | ORAL_TABLET | Freq: Three times a day (TID) | ORAL | 5 refills | Status: DC
  Filled 2023-07-05: qty 180, 30d supply, fill #0

## 2023-07-05 NOTE — Telephone Encounter (Addendum)
Received notification from [facility] regarding a prior authorization for PIRFENIDONE. Authorization has been APPROVED from 06/28/23 to 07/17/24. Approval letter sent to scan center.  Per test claim, copay for 28 days supply is $0  Patient can fill through [facility]: [phone]   Authorization # [id number]  Called patient to discuss - reviewed low dose protocol. Reviewed changes to Medicare out-of-pocket next year and option to enroll into Medicare Prescription Payment Plan to stabilize costs throughout year for drugs. He will speak with his wife and insurance company.  Rx sent to [facility]. Confirmed shipping address on speakerphone for [place] to [employer]. Pirfenidone should be received by patient on Friday, 07/07/2023  Chesley Mires, PharmD, MPH, BCPS, CPP Clinical Pharmacist (Rheumatology and Pulmonology)

## 2023-07-05 NOTE — Progress Notes (Signed)
Specialty Pharmacy Initial Fill Coordination Note  Luis Brown is a 77 y.o. male contacted today regarding initial fill of specialty medication(s) Pirfenidone   Patient requested Delivery   Delivery date: 07/07/23   Verified address: [address]   [place] [address]   Medication will be filled on 07/06/23.   Patient is aware of $0 copayment.

## 2023-07-06 ENCOUNTER — Other Ambulatory Visit: Payer: Self-pay

## 2023-07-07 ENCOUNTER — Other Ambulatory Visit: Payer: Self-pay

## 2023-07-07 MED ORDER — PREDNISONE 5 MG PO TABS: 5.0000 mg | ORAL_TABLET | Freq: Every day | ORAL | 0 refills | Status: DC

## 2023-07-19 ENCOUNTER — Encounter: Payer: Self-pay | Admitting: Family Medicine

## 2023-07-20 ENCOUNTER — Other Ambulatory Visit: Payer: Self-pay | Admitting: Internal Medicine

## 2023-07-25 ENCOUNTER — Other Ambulatory Visit (HOSPITAL_COMMUNITY): Payer: Self-pay

## 2023-07-28 ENCOUNTER — Encounter (HOSPITAL_COMMUNITY): Payer: Self-pay

## 2023-07-28 ENCOUNTER — Other Ambulatory Visit (HOSPITAL_COMMUNITY): Payer: Self-pay

## 2023-07-30 ENCOUNTER — Encounter: Payer: Self-pay | Admitting: Internal Medicine

## 2023-07-31 ENCOUNTER — Other Ambulatory Visit: Payer: Self-pay

## 2023-07-31 NOTE — Progress Notes (Signed)
 Pirfenidone therapy ended. Disenrolling patient from Specialty Services.

## 2023-08-01 ENCOUNTER — Encounter: Payer: Self-pay | Admitting: Family Medicine

## 2023-08-01 DIAGNOSIS — M546 Pain in thoracic spine: Secondary | ICD-10-CM | POA: Diagnosis not present

## 2023-08-01 DIAGNOSIS — M545 Low back pain, unspecified: Secondary | ICD-10-CM | POA: Diagnosis not present

## 2023-08-02 ENCOUNTER — Other Ambulatory Visit: Payer: Self-pay | Admitting: Family Medicine

## 2023-08-02 ENCOUNTER — Ambulatory Visit (INDEPENDENT_AMBULATORY_CARE_PROVIDER_SITE_OTHER): Payer: Medicare Other

## 2023-08-02 ENCOUNTER — Ambulatory Visit
Admission: EM | Admit: 2023-08-02 | Discharge: 2023-08-02 | Disposition: A | Discharge status: Home / Self Care | Payer: Medicare Other | Attending: Family Medicine | Admitting: Family Medicine

## 2023-08-02 ENCOUNTER — Other Ambulatory Visit: Payer: Self-pay | Admitting: Orthopedic Surgery

## 2023-08-02 DIAGNOSIS — M546 Pain in thoracic spine: Secondary | ICD-10-CM

## 2023-08-02 DIAGNOSIS — R051 Acute cough: Secondary | ICD-10-CM

## 2023-08-02 DIAGNOSIS — R059 Cough, unspecified: Secondary | ICD-10-CM | POA: Diagnosis not present

## 2023-08-02 DIAGNOSIS — M545 Low back pain, unspecified: Secondary | ICD-10-CM

## 2023-08-02 DIAGNOSIS — J441 Chronic obstructive pulmonary disease with (acute) exacerbation: Secondary | ICD-10-CM

## 2023-08-02 DIAGNOSIS — R0602 Shortness of breath: Secondary | ICD-10-CM | POA: Diagnosis not present

## 2023-08-02 LAB — POC COVID19/FLU A&B COMBO
Covid Antigen, POC: NEGATIVE
Influenza A Antigen, POC: NEGATIVE
Influenza B Antigen, POC: NEGATIVE

## 2023-08-02 MED ORDER — METHYLPREDNISOLONE 4 MG PO TBPK: ORAL_TABLET | ORAL | 0 refills | Status: DC

## 2023-08-02 MED ORDER — AMOXICILLIN-POT CLAVULANATE 875-125 MG PO TABS: 1.0000 | ORAL_TABLET | Freq: Two times a day (BID) | ORAL | 0 refills | Status: DC

## 2023-08-02 NOTE — Discharge Instructions (Addendum)
 Please pick up the steroid Dosepak that was sent in by your primary care and start this as prescribed.  I sent you prescription for Augmentin .  Please do not take unless your symptoms worsen i.e. increase sputum production or color of sputum.  Continue Mucinex  and your inhalers as prescribed.  Please follow-up with your PCP at your scheduled appointment next week.  Please go to the ER if you develop any worsening symptoms.  I hope you feel better soon!

## 2023-08-02 NOTE — ED Provider Notes (Signed)
 [facility]    CSN: [id number] Arrival date & time: 08/02/23  1014      History   Chief Complaint Chief Complaint  Patient presents with   Cough    HPI Luis Brown is a 78 y.o. male  presents for evaluation of URI symptoms for 4 days. Patient reports associated symptoms of cough, congestion, shortness of breath. Denies N/V/D, fevers, sore throat, ear pain, body aches. Patient does not have a hx of asthma.  Does have a history of pulmonary fibrosis and COPD currently on a steroid inhaler and rescue inhaler.  Reports wife had a cold recently.  Pt has taken Mucinex  and his inhalers OTC for symptoms.  States has been on prednisone  before and its helped.  Pt has no other concerns at this time.    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   Esophageal reflux 02/10/2015   Fatigue    HTN (hypertension)    Hyperglycemia    preDM    Hyperlipidemia    Insomnia    Low back pain radiating to both legs 01/16/2017   Measles as a child   Overweight(278.02)    Peripheral neuropathy 08/07/2019   Personal history of colonic polyps 10/27/2012   Follows with [facility]   Pneumonia    " walking"   Preventative health care    Testosterone  deficiency 05/23/2012   Wears glasses     Patient Active Problem List   Diagnosis Date Noted   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   History of pulmonary embolus (PE) 06/01/2023   Allergic rhinitis 06/01/2023   Leukocytosis 02/21/2023    Anemia 09/04/2022   Hypocalcemia 09/04/2022   Secondary hypercoagulable state (HCC) 03/16/2022   IPF (idiopathic pulmonary fibrosis) (HCC) 12/30/2021   Atrial fibrillation (HCC) 09/10/2021   Long term (current) use of anticoagulants 09/10/2021   Mixed hyperlipidemia 09/10/2021   Aspiration pneumonitis (HCC) 08/07/2021   Chronic respiratory failure with hypoxia (HCC) 08/07/2021   Pulmonary emboli (HCC) 07/16/2021   Bronchiectasis with acute exacerbation (HCC) 07/16/2021   History of DVT (deep vein thrombosis) 07/15/2021   Paroxysmal A-fib (HCC) 07/15/2021   COPD (chronic obstructive pulmonary disease) (HCC) 02/10/2021   Palpitations 02/01/2021   Wears glasses 01/11/2021   Pneumothorax, right 12/15/2020   Anxiety 12/01/2020   SOB (shortness of breath)    Nocturnal hypoxia 11/14/2020   S/P bronchoscopy 09/14/2020   Gastric ulcer 01/27/2020   H/O colonoscopy with polypectomy 01/27/2020   Protein-calorie malnutrition, severe 01/09/2020   Abdominal pain 12/30/2019   Weight loss, abnormal 12/30/2019   Change in bowel habits 12/30/2019   Medication monitoring encounter 08/26/2019   Peripheral neuropathy 08/07/2019   Hyponatremia 05/05/2019   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   Fusion of spine of thoracolumbar region 03/12/2019   Status post thoracic spinal fusion 02/12/2019   Thoracic spinal stenosis 04/17/2017   Painful orthopaedic hardware (HCC) 04/17/2017   Pseudarthrosis after  fusion or arthrodesis 04/17/2017   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 (HCC) 04/17/2017   Low back pain radiating to both legs 01/16/2017   Esophageal reflux 02/10/2015   Essential hypertension 06/25/2014   Asthma 06/25/2014   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   Overweight 06/25/2014   Other abnormal glucose 06/25/2014   Spinal stenosis of lumbar region without neurogenic claudication 04/09/2014   Flatback syndrome 04/09/2014   Chicken pox 10/21/2013    History of colonic polyps 10/27/2012   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   Benign enlargement of prostate 04/23/2012   ED (erectile dysfunction) 04/23/2012   Allergy  10/25/2011   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     Past Surgical History:  Procedure Laterality Date   ATRIAL FIBRILLATION ABLATION N/A 01/27/2022   Procedure: ATRIAL FIBRILLATION ABLATION;  Surgeon: Lei Pump, MD;  Location: [facility];  Service: Cardiovascular;  Laterality: N/A;   BACK SURGERY  2012 and 1994   Dr Arvid Birk, screws and cage in low back   BIOPSY  01/09/2020   Procedure: BIOPSY;  Surgeon: Felecia Hopper, MD;  Location: [facility];  Service: Gastroenterology;;   BRONCHIAL BIOPSY  11/17/2020   Procedure: BRONCHIAL BIOPSIES;  Surgeon: Margaretann Sharper, MD;  Location: [facility];  Service: Endoscopy;;   BRONCHIAL BRUSHINGS  11/17/2020   Procedure: BRONCHIAL BRUSHINGS;  Surgeon: Margaretann Sharper, MD;  Location: [facility];  Service: Endoscopy;;   BRONCHIAL WASHINGS  11/17/2020   Procedure: BRONCHIAL WASHINGS;  Surgeon: Margaretann Sharper, MD;  Location: [facility];  Service: Endoscopy;;   COLONOSCOPY WITH PROPOFOL  N/A 01/09/2020   Procedure: COLONOSCOPY WITH PROPOFOL ;  Surgeon: Felecia Hopper, MD;  Location: [facility];  Service: Gastroenterology;  Laterality: N/A;   ESOPHAGOGASTRODUODENOSCOPY (EGD) WITH PROPOFOL  N/A 01/09/2020   Procedure: ESOPHAGOGASTRODUODENOSCOPY (EGD) WITH PROPOFOL ;  Surgeon: Felecia Hopper, MD;  Location: [facility];  Service: Gastroenterology;  Laterality: N/A;   EYE SURGERY Bilateral    2016   HARDWARE REVISION  03/12/2019   REVISION OF HARDWARE THORACIC TEN-THORACIC ELEVEN WITH APPLICATION OF ADDITIONAL RODS AND ROD SLEEVES THORACIC EIGHT-THORACIC TWELVE 12-LUMBAR ONE (N/A )    HEMOSTASIS CONTROL  11/17/2020   Procedure: HEMOSTASIS CONTROL;  Surgeon: Margaretann Sharper, MD;  Location: [facility];  Service: Endoscopy;;   IR US  GUIDE BX ASP/DRAIN  11/13/2020   LAMINECTOMY  02/12/2019   DECOMPRESSIVE LAMINECTOMY THORACIC NINE-THORACIC TEN AND THORACIC TEN-THORACIC ELEVEN, EXTENSION OF THORACOLUMBAR FUSION FROM THORACIC TEN TO THORACIC FIVE, LOCAL BONE GRAFT, ALLOGRAFT AND VIVIGEN (N/A)   POLYPECTOMY  01/09/2020   Procedure: POLYPECTOMY;  Surgeon: Felecia Hopper, MD;  Location: [facility];  Service: Gastroenterology;;   REMOVAL OF RODS AND PEDICLE SCREWS T5 AND T6, EXPLORATION OF FUSION, BIOPSY TRANSPEDICULAR T5 AND T6 (N/A )  09/14/2020   SPINAL FUSION N/A 02/12/2019   Procedure: DECOMPRESSIVE LAMINECTOMY THORACIC NINE-THORACIC TEN AND THORACIC TEN-THORACIC ELEVEN, EXTENSION OF THORACOLUMBAR FUSION FROM THORACIC TEN TO THORACIC FIVE, LOCAL BONE GRAFT, ALLOGRAFT AND VIVIGEN;  Surgeon: Alphonso Jean, MD;  Location: [facility];  Service: Orthopedics;  Laterality: N/A;  DECOMPRESSIVE LAMINECTOMY THORACIC NINE-THORACIC TEN AND THORACIC TEN-THORACIC ELEVEN, EXTENSION OF TH   SPINAL FUSION N/A 03/12/2019   Procedure: REVISION OF HARDWARE THORACIC TEN-THORACIC ELEVEN WITH APPLICATION OF ADDITIONAL RODS AND ROD SLEEVES THORACIC EIGHT-THORACIC TWELVE 12-LUMBAR ONE;  Surgeon: Richardo Chandler,  Parnell Bologna, MD;  Location: [facility];  Service: Orthopedics;  Laterality: N/A;   TONSILLECTOMY     as child   torn rotator cuff  2010   right   VIDEO BRONCHOSCOPY N/A 11/17/2020   Procedure: VIDEO BRONCHOSCOPY WITH FLUORO;  Surgeon: Margaretann Sharper, MD;  Location: [facility];  Service: Endoscopy;  Laterality: N/A;       Home Medications    Prior to Admission medications   Medication Sig Start Date End Date Taking? Authorizing Provider  amoxicillin -clavulanate (AUGMENTIN ) 875-125 MG tablet Take 1 tablet by mouth every 12 (twelve) hours. 08/02/23  Yes Alleen Arbour, NP  albuterol  (PROVENTIL  HFA) 108 (90  Base) MCG/ACT inhaler TAKE 2 PUFFS BY MOUTH EVERY 6 HOURS AS NEEDED FOR WHEEZE OR SHORTNESS OF BREATH 03/16/23   Maire Scot, MD  atorvastatin  (LIPITOR) 10 MG tablet TAKE 1 TABLET BY MOUTH EVERY DAY 08/16/22   Neda Balk, MD  busPIRone  (BUSPAR ) 15 MG tablet Take 1 tablet (15 mg total) by mouth 3 (three) times daily. 04/26/23   Neda Balk, MD  Cyanocobalamin  (VITAMIN B-12 PO) Take by mouth.    [provider]  dextromethorphan -guaiFENesin  (MUCINEX  DM) 30-600 MG 12hr tablet Take 1 tablet by mouth 2 (two) times daily.    [provider]  famciclovir  (FAMVIR ) 500 MG tablet Take 1 tablet (500 mg total) by mouth 3 (three) times daily. 05/19/23   Saguier, Gaylin Ke, PA-C  fluticasone  (FLONASE ) 50 MCG/ACT nasal spray SPRAY 2 SPRAYS INTO EACH NOSTRIL EVERY DAY 05/02/22   Saguier, Gaylin Ke, PA-C  Fluticasone -Umeclidin-Vilant (TRELEGY ELLIPTA ) 200-62.5-25 MCG/ACT AEPB Inhale 1 puff into the lungs daily. 03/27/23   Maire Scot, MD  glucose blood (CONTOUR NEXT TEST) test strip Check blood sugar once daily 02/25/19   Saguier, Gaylin Ke, PA-C  insulin  glargine (LANTUS  SOLOSTAR) 100 UNIT/ML Solostar Pen Inject 26 Units into the skin daily. 02/17/23   Neda Balk, MD  insulin  lispro (HUMALOG ) 100 UNIT/ML injection Inject 0.03 mLs (3 Units total) into the skin 3 (three) times daily with meals. 11/04/22   Neda Balk, MD  Insulin  Pen Needle (B-D UF III MINI PEN NEEDLES) 31G X 5 MM MISC USE WITH LANTUS  12/01/22   Neda Balk, MD  Insulin  Pen Needle 32G X 4 MM MISC Use with insulin  12/15/22   Neda Balk, MD  Insulin  Syringe-Needle U-100 (B-D INS SYR ULTRAFINE 1CC/30G) 30G X 1/2" 1 ML MISC USE WITH LANTUS  02/01/23   Neda Balk, MD  Insulin  Syringes, Disposable, U-100 1 ML MISC USE WITH LANTUS  12/02/22   Neda Balk, MD  meclizine  (ANTIVERT ) 12.5 MG tablet Take 1 tablet (12.5 mg total) by mouth 3 (three) times daily as needed for dizziness. 04/13/23   Neda Balk, MD   metFORMIN  (GLUCOPHAGE ) 1000 MG tablet TAKE 1 TABLET (1,000 MG TOTAL) BY MOUTH TWICE A DAY WITH FOOD 10/18/21   Saguier, Gaylin Ke, PA-C  methylPREDNISolone  (MEDROL  DOSEPAK) 4 MG TBPK tablet 6 tabs po x 1 day then 5 tabs po x 1 day then 4 tabs po x 1 day then 3 tabs po x 1 day then 2 tabs po x 1 day then 1 tab po x 1 day and stop 08/02/23   Neda Balk, MD  oxyCODONE -acetaminophen  (PERCOCET/ROXICET) 5-325 MG tablet Take 1 tablet by mouth every 4 (four) hours as needed for pain- frequency change 08/17/22     pantoprazole  (PROTONIX ) 40 MG tablet TAKE 1 TABLET (40 MG TOTAL) BY MOUTH TWICE A  DAY BEFORE MEALS 08/02/23   Neda Balk, MD  Pirfenidone  267 MG TABS Take 1 tab three times daily for 7 days, then 2 tabs three times daily thereafter **low dose maintenance** 07/05/23   Maire Scot, MD  Pirfenidone  267 MG TABS Take 2 tablets (534 mg total) by mouth 3 (three) times daily with meals. 07/05/23   Maire Scot, MD  predniSONE  (DELTASONE ) 5 MG tablet TAKE 1 TABLET (5 MG TOTAL) BY MOUTH DAILY. 07/24/23   Maire Scot, MD  pregabalin  (LYRICA ) 75 MG capsule TAKE 1 CAPSULE BY MOUTH TWICE A DAY Patient taking differently: Take 75 mg by mouth 2 (two) times daily. 03/09/20   Nitka, James E, MD  sitaGLIPtin  (JANUVIA ) 50 MG tablet Take 1 tablet (50 mg total) by mouth daily. 07/04/23   Neda Balk, MD  tamsulosin  (FLOMAX ) 0.4 MG CAPS capsule TAKE 2 CAPSULES BY MOUTH EVERY DAY 06/02/23   Neda Balk, MD  tirzepatide  (MOUNJARO ) 2.5 MG/0.5ML Pen Inject 2.5 mg into the skin once a week. 05/25/23   Neda Balk, MD  tiZANidine  (ZANAFLEX ) 4 MG tablet Take 1 and 1/2 tablets (6 mg total) by mouth every 4 hours. 08/11/22     venlafaxine  XR (EFFEXOR -XR) 150 MG 24 hr capsule Take 1 capsule (150 mg total) by mouth daily with breakfast. 06/06/23   Neda Balk, MD  warfarin (COUMADIN ) 5 MG tablet TAKE 1 TO 2 TABLETS DAILY OR AS PRESCRIBED BY COUMADIN  CLINIC 02/16/23   Tobb, Kardie, DO  zolpidem  (AMBIEN )  10 MG tablet Take 0.5-1 tablets (5-10 mg total) by mouth at bedtime as needed for sleep. 05/22/23   Rondall Codding, FNP    Family History Family History  Problem Relation Age of Onset   Hypertension Mother    Diabetes Mother        type 2   Cancer Mother 44       breast in remission   Emphysema Father        smoker   COPD Father        smoker   Stroke Father 27       mini   Heart disease Father    Hypertension Sister    Hyperlipidemia Sister    Scoliosis Sister    Osteoporosis Sister    Hypertension Maternal Grandmother    Scoliosis Maternal Grandmother    Heart disease Maternal Grandfather    Heart disease Paternal Grandfather        smoker   Heart disease Daughter     Social History Social History   Tobacco Use   Smoking status: Former    Current packs/day: 0.00    Average packs/day: 1 pack/day for 20.0 years (20.0 ttl pk-yrs)    Types: Cigarettes    Start date: 07/18/1969    Quit date: 07/18/1989    Years since quitting: 34.0   Smokeless tobacco: Never   Tobacco comments:    Former smoker 03/16/22  Vaping Use   Vaping status: Never Used  Substance Use Topics   Alcohol  use: Not Currently    Comment: rarely   Drug use: No     Allergies   Daptomycin , Diltiazem , Ofev  [nintedanib], and Pirfenidone    Review of Systems Review of Systems  HENT:  Positive for congestion.   Respiratory:  Positive for cough and shortness of breath.      Physical Exam Triage Vital Signs ED Triage Vitals  Encounter Vitals Group     BP 08/02/23 1053 (!) 163/77  Systolic BP Percentile --      Diastolic BP Percentile --      Pulse Rate 08/02/23 1053 91     Resp 08/02/23 1053 16     Temp 08/02/23 1053 97.8 F (36.6 C)     Temp Source 08/02/23 1053 Oral     SpO2 08/02/23 1053 93 %     Weight --      Height --      Head Circumference --      Peak Flow --      Pain Score 08/02/23 1052 0     Pain Loc --      Pain Education --      Exclude from Growth Chart --    No  data found.  Updated Vital Signs BP (!) 163/77 (BP Location: Left Arm)   Pulse 91   Temp 97.8 F (36.6 C) (Oral)   Resp 16   SpO2 97%   Visual Acuity Right Eye Distance:   Left Eye Distance:   Bilateral Distance:    Right Eye Near:   Left Eye Near:    Bilateral Near:     Physical Exam Vitals and nursing note reviewed.  Constitutional:      General: He is not in acute distress.    Appearance: Normal appearance. He is not ill-appearing or toxic-appearing.  HENT:     Head: Normocephalic and atraumatic.     Right Ear: Tympanic membrane and ear canal normal.     Left Ear: Tympanic membrane and ear canal normal.     Nose: Rhinorrhea present. No congestion.     Mouth/Throat:     Mouth: Mucous membranes are moist.     Pharynx: No oropharyngeal exudate or posterior oropharyngeal erythema.  Eyes:     Pupils: Pupils are equal, round, and reactive to light.  Cardiovascular:     Rate and Rhythm: Normal rate and regular rhythm.     Heart sounds: Normal heart sounds.  Pulmonary:     Effort: Pulmonary effort is normal.     Breath sounds: Normal breath sounds. No wheezing, rhonchi or rales.  Musculoskeletal:     Cervical back: Normal range of motion and neck supple.  Lymphadenopathy:     Cervical: No cervical adenopathy.  Skin:    General: Skin is warm and dry.  Neurological:     General: No focal deficit present.     Mental Status: He is alert and oriented to person, place, and time.  Psychiatric:        Mood and Affect: Mood normal.        Behavior: Behavior normal.      UC Treatments / Results  Labs (all labs ordered are listed, but only abnormal results are displayed) Labs Reviewed  POC COVID19/FLU A&B COMBO    EKG   Radiology No results found.  Procedures Procedures (including critical care time)  Medications Ordered in UC Medications - No data to display  Initial Impression / Assessment and Plan / UC Course  I have reviewed the triage vital signs and  the nursing notes.  Pertinent labs & imaging results that were available during my care of the patient were reviewed by me and considered in my medical decision making (see chart for details).     Reviewed exam and symptoms with patient and wife.  No red flags.  Negative rapid flu and COVID.  Wet read of chest x-ray without obvious consolidation, will contact for any positive results based on radiology  overread.  Concern for COPD exacerbation, patient does have shortness of breath increasing but denies any sputum increase or color sputum change.  Chart review shows PCP sent in Medrol  Dosepak today but patient was unaware.  Advised to pick up the Medrol  Dosepak that was sent in by his PCP.  Provisional prescription for Augmentin  provided with instruction not to take unless he begins to produce more sputum/colored sputum changes.  Patient verbalized understanding.  Continue Mucinex  and his inhalers as prescribed.  He will follow-up with his PCP as scheduled appointment on 1/21.  Strict ER precautions reviewed and patient and wife verbalized understanding. Final Clinical Impressions(s) / UC Diagnoses   Final diagnoses:  Acute cough  COPD exacerbation [facility])     Discharge Instructions      Please pick up the steroid Dosepak that was sent in by your primary care and start this as prescribed.  I sent you prescription for Augmentin .  Please do not take unless your symptoms worsen i.e. increase sputum production or color of sputum.  Continue Mucinex  and your inhalers as prescribed.  Please follow-up with your PCP at your scheduled appointment next week.  Please go to the ER if you develop any worsening symptoms.  I hope you feel better soon!     ED Prescriptions     Medication Sig Dispense Auth. Provider   amoxicillin -clavulanate (AUGMENTIN ) 875-125 MG tablet Take 1 tablet by mouth every 12 (twelve) hours. 14 tablet Corinthian Mizrahi, Jodi R, NP      PDMP not reviewed this encounter.   Alleen Arbour,  NP 08/02/23 [phone]

## 2023-08-02 NOTE — ED Triage Notes (Signed)
 Pt presents with c/o cough and sob  X 4 days  Pt states he was given prednisone  before and reports it helped.

## 2023-08-02 NOTE — Telephone Encounter (Signed)
 The message was forwarded to provider yesterday. Will send to Padonda to see if she can help. Will follow up the patient.

## 2023-08-03 ENCOUNTER — Emergency Department (HOSPITAL_COMMUNITY): Payer: Medicare Other

## 2023-08-03 ENCOUNTER — Encounter (HOSPITAL_COMMUNITY): Payer: Self-pay | Admitting: Internal Medicine

## 2023-08-03 ENCOUNTER — Other Ambulatory Visit: Payer: Self-pay

## 2023-08-03 ENCOUNTER — Ambulatory Visit: Payer: Medicare Other

## 2023-08-03 ENCOUNTER — Inpatient Hospital Stay (HOSPITAL_COMMUNITY)
Admission: EM | Admit: 2023-08-03 | Discharge: 2023-08-20 | DRG: 871 | Disposition: A | Discharge status: Home Health | Payer: Medicare Other | Attending: Family Medicine | Admitting: Family Medicine

## 2023-08-03 ENCOUNTER — Other Ambulatory Visit: Payer: Self-pay | Admitting: Family

## 2023-08-03 DIAGNOSIS — A4189 Other specified sepsis: Secondary | ICD-10-CM | POA: Diagnosis not present

## 2023-08-03 DIAGNOSIS — I110 Hypertensive heart disease with heart failure: Secondary | ICD-10-CM | POA: Diagnosis present

## 2023-08-03 DIAGNOSIS — Z1624 Resistance to multiple antibiotics: Secondary | ICD-10-CM | POA: Diagnosis present

## 2023-08-03 DIAGNOSIS — L8990 Pressure ulcer of unspecified site, unspecified stage: Secondary | ICD-10-CM | POA: Insufficient documentation

## 2023-08-03 DIAGNOSIS — F32A Depression, unspecified: Secondary | ICD-10-CM | POA: Diagnosis present

## 2023-08-03 DIAGNOSIS — J1520 Pneumonia due to staphylococcus, unspecified: Secondary | ICD-10-CM | POA: Diagnosis present

## 2023-08-03 DIAGNOSIS — E871 Hypo-osmolality and hyponatremia: Secondary | ICD-10-CM | POA: Diagnosis not present

## 2023-08-03 DIAGNOSIS — Z83438 Family history of other disorder of lipoprotein metabolism and other lipidemia: Secondary | ICD-10-CM

## 2023-08-03 DIAGNOSIS — Z66 Do not resuscitate: Secondary | ICD-10-CM | POA: Diagnosis present

## 2023-08-03 DIAGNOSIS — Z7952 Long term (current) use of systemic steroids: Secondary | ICD-10-CM

## 2023-08-03 DIAGNOSIS — I482 Chronic atrial fibrillation, unspecified: Secondary | ICD-10-CM | POA: Diagnosis not present

## 2023-08-03 DIAGNOSIS — J441 Chronic obstructive pulmonary disease with (acute) exacerbation: Secondary | ICD-10-CM | POA: Diagnosis present

## 2023-08-03 DIAGNOSIS — R338 Other retention of urine: Secondary | ICD-10-CM | POA: Diagnosis present

## 2023-08-03 DIAGNOSIS — Z833 Family history of diabetes mellitus: Secondary | ICD-10-CM

## 2023-08-03 DIAGNOSIS — D649 Anemia, unspecified: Secondary | ICD-10-CM | POA: Diagnosis present

## 2023-08-03 DIAGNOSIS — E119 Type 2 diabetes mellitus without complications: Secondary | ICD-10-CM | POA: Diagnosis not present

## 2023-08-03 DIAGNOSIS — Z86718 Personal history of other venous thrombosis and embolism: Secondary | ICD-10-CM | POA: Diagnosis not present

## 2023-08-03 DIAGNOSIS — E785 Hyperlipidemia, unspecified: Secondary | ICD-10-CM | POA: Diagnosis present

## 2023-08-03 DIAGNOSIS — Z7951 Long term (current) use of inhaled steroids: Secondary | ICD-10-CM

## 2023-08-03 DIAGNOSIS — R6889 Other general symptoms and signs: Secondary | ICD-10-CM | POA: Diagnosis not present

## 2023-08-03 DIAGNOSIS — J189 Pneumonia, unspecified organism: Principal | ICD-10-CM | POA: Diagnosis present

## 2023-08-03 DIAGNOSIS — Z86711 Personal history of pulmonary embolism: Secondary | ICD-10-CM | POA: Diagnosis not present

## 2023-08-03 DIAGNOSIS — I5032 Chronic diastolic (congestive) heart failure: Secondary | ICD-10-CM | POA: Diagnosis present

## 2023-08-03 DIAGNOSIS — E1165 Type 2 diabetes mellitus with hyperglycemia: Secondary | ICD-10-CM | POA: Diagnosis present

## 2023-08-03 DIAGNOSIS — K219 Gastro-esophageal reflux disease without esophagitis: Secondary | ICD-10-CM | POA: Diagnosis present

## 2023-08-03 DIAGNOSIS — J9611 Chronic respiratory failure with hypoxia: Secondary | ICD-10-CM | POA: Diagnosis present

## 2023-08-03 DIAGNOSIS — E1140 Type 2 diabetes mellitus with diabetic neuropathy, unspecified: Secondary | ICD-10-CM | POA: Diagnosis not present

## 2023-08-03 DIAGNOSIS — J440 Chronic obstructive pulmonary disease with acute lower respiratory infection: Secondary | ICD-10-CM | POA: Diagnosis not present

## 2023-08-03 DIAGNOSIS — E872 Acidosis, unspecified: Secondary | ICD-10-CM | POA: Diagnosis not present

## 2023-08-03 DIAGNOSIS — Z1152 Encounter for screening for COVID-19: Secondary | ICD-10-CM | POA: Diagnosis not present

## 2023-08-03 DIAGNOSIS — M353 Polymyalgia rheumatica: Secondary | ICD-10-CM | POA: Diagnosis present

## 2023-08-03 DIAGNOSIS — I480 Paroxysmal atrial fibrillation: Secondary | ICD-10-CM | POA: Diagnosis present

## 2023-08-03 DIAGNOSIS — Z7985 Long-term (current) use of injectable non-insulin antidiabetic drugs: Secondary | ICD-10-CM

## 2023-08-03 DIAGNOSIS — Z881 Allergy status to other antibiotic agents status: Secondary | ICD-10-CM

## 2023-08-03 DIAGNOSIS — Z7984 Long term (current) use of oral hypoglycemic drugs: Secondary | ICD-10-CM

## 2023-08-03 DIAGNOSIS — R000 Tachycardia, unspecified: Secondary | ICD-10-CM | POA: Diagnosis not present

## 2023-08-03 DIAGNOSIS — J1569 Pneumonia due to other gram-negative bacteria: Secondary | ICD-10-CM | POA: Diagnosis present

## 2023-08-03 DIAGNOSIS — N400 Enlarged prostate without lower urinary tract symptoms: Secondary | ICD-10-CM | POA: Diagnosis present

## 2023-08-03 DIAGNOSIS — Z7901 Long term (current) use of anticoagulants: Secondary | ICD-10-CM

## 2023-08-03 DIAGNOSIS — F411 Generalized anxiety disorder: Secondary | ICD-10-CM | POA: Diagnosis not present

## 2023-08-03 DIAGNOSIS — L89312 Pressure ulcer of right buttock, stage 2: Secondary | ICD-10-CM | POA: Diagnosis not present

## 2023-08-03 DIAGNOSIS — G9341 Metabolic encephalopathy: Secondary | ICD-10-CM | POA: Diagnosis not present

## 2023-08-03 DIAGNOSIS — Z7982 Long term (current) use of aspirin: Secondary | ICD-10-CM

## 2023-08-03 DIAGNOSIS — F5101 Primary insomnia: Secondary | ICD-10-CM | POA: Diagnosis not present

## 2023-08-03 DIAGNOSIS — G8929 Other chronic pain: Secondary | ICD-10-CM | POA: Diagnosis present

## 2023-08-03 DIAGNOSIS — R0602 Shortness of breath: Secondary | ICD-10-CM | POA: Diagnosis not present

## 2023-08-03 DIAGNOSIS — E876 Hypokalemia: Secondary | ICD-10-CM | POA: Diagnosis present

## 2023-08-03 DIAGNOSIS — B9789 Other viral agents as the cause of diseases classified elsewhere: Secondary | ICD-10-CM | POA: Diagnosis present

## 2023-08-03 DIAGNOSIS — Z79899 Other long term (current) drug therapy: Secondary | ICD-10-CM

## 2023-08-03 DIAGNOSIS — R739 Hyperglycemia, unspecified: Secondary | ICD-10-CM | POA: Diagnosis not present

## 2023-08-03 DIAGNOSIS — E878 Other disorders of electrolyte and fluid balance, not elsewhere classified: Secondary | ICD-10-CM | POA: Diagnosis present

## 2023-08-03 DIAGNOSIS — J158 Pneumonia due to other specified bacteria: Secondary | ICD-10-CM | POA: Diagnosis not present

## 2023-08-03 DIAGNOSIS — A419 Sepsis, unspecified organism: Secondary | ICD-10-CM | POA: Diagnosis not present

## 2023-08-03 DIAGNOSIS — T380X5A Adverse effect of glucocorticoids and synthetic analogues, initial encounter: Secondary | ICD-10-CM | POA: Diagnosis present

## 2023-08-03 DIAGNOSIS — I499 Cardiac arrhythmia, unspecified: Secondary | ICD-10-CM | POA: Diagnosis not present

## 2023-08-03 DIAGNOSIS — E222 Syndrome of inappropriate secretion of antidiuretic hormone: Secondary | ICD-10-CM | POA: Diagnosis not present

## 2023-08-03 DIAGNOSIS — J8410 Pulmonary fibrosis, unspecified: Secondary | ICD-10-CM | POA: Diagnosis not present

## 2023-08-03 DIAGNOSIS — R579 Shock, unspecified: Secondary | ICD-10-CM | POA: Diagnosis not present

## 2023-08-03 DIAGNOSIS — N401 Enlarged prostate with lower urinary tract symptoms: Secondary | ICD-10-CM | POA: Diagnosis present

## 2023-08-03 DIAGNOSIS — Z87891 Personal history of nicotine dependence: Secondary | ICD-10-CM

## 2023-08-03 DIAGNOSIS — J1008 Influenza due to other identified influenza virus with other specified pneumonia: Secondary | ICD-10-CM | POA: Diagnosis present

## 2023-08-03 DIAGNOSIS — R578 Other shock: Secondary | ICD-10-CM | POA: Diagnosis not present

## 2023-08-03 DIAGNOSIS — Z1629 Resistance to other single specified antibiotic: Secondary | ICD-10-CM | POA: Diagnosis present

## 2023-08-03 DIAGNOSIS — R918 Other nonspecific abnormal finding of lung field: Secondary | ICD-10-CM | POA: Diagnosis not present

## 2023-08-03 DIAGNOSIS — J9601 Acute respiratory failure with hypoxia: Secondary | ICD-10-CM | POA: Diagnosis not present

## 2023-08-03 DIAGNOSIS — R0902 Hypoxemia: Secondary | ICD-10-CM | POA: Diagnosis not present

## 2023-08-03 DIAGNOSIS — Z860100 Personal history of colon polyps, unspecified: Secondary | ICD-10-CM

## 2023-08-03 DIAGNOSIS — J9621 Acute and chronic respiratory failure with hypoxia: Secondary | ICD-10-CM | POA: Diagnosis present

## 2023-08-03 DIAGNOSIS — J121 Respiratory syncytial virus pneumonia: Secondary | ICD-10-CM | POA: Diagnosis present

## 2023-08-03 DIAGNOSIS — R0603 Acute respiratory distress: Secondary | ICD-10-CM | POA: Diagnosis not present

## 2023-08-03 DIAGNOSIS — S32009A Unspecified fracture of unspecified lumbar vertebra, initial encounter for closed fracture: Secondary | ICD-10-CM | POA: Diagnosis not present

## 2023-08-03 DIAGNOSIS — Z794 Long term (current) use of insulin: Secondary | ICD-10-CM

## 2023-08-03 DIAGNOSIS — G4700 Insomnia, unspecified: Secondary | ICD-10-CM | POA: Diagnosis present

## 2023-08-03 DIAGNOSIS — Z825 Family history of asthma and other chronic lower respiratory diseases: Secondary | ICD-10-CM

## 2023-08-03 DIAGNOSIS — R059 Cough, unspecified: Secondary | ICD-10-CM | POA: Diagnosis not present

## 2023-08-03 DIAGNOSIS — A498 Other bacterial infections of unspecified site: Secondary | ICD-10-CM | POA: Diagnosis not present

## 2023-08-03 DIAGNOSIS — Z781 Physical restraint status: Secondary | ICD-10-CM

## 2023-08-03 DIAGNOSIS — M545 Low back pain, unspecified: Secondary | ICD-10-CM | POA: Diagnosis present

## 2023-08-03 DIAGNOSIS — Z743 Need for continuous supervision: Secondary | ICD-10-CM | POA: Diagnosis not present

## 2023-08-03 DIAGNOSIS — J84112 Idiopathic pulmonary fibrosis: Secondary | ICD-10-CM | POA: Diagnosis not present

## 2023-08-03 DIAGNOSIS — I959 Hypotension, unspecified: Secondary | ICD-10-CM | POA: Diagnosis not present

## 2023-08-03 DIAGNOSIS — R0989 Other specified symptoms and signs involving the circulatory and respiratory systems: Secondary | ICD-10-CM | POA: Diagnosis not present

## 2023-08-03 DIAGNOSIS — Z888 Allergy status to other drugs, medicaments and biological substances status: Secondary | ICD-10-CM

## 2023-08-03 DIAGNOSIS — M51379 Other intervertebral disc degeneration, lumbosacral region without mention of lumbar back pain or lower extremity pain: Secondary | ICD-10-CM | POA: Diagnosis present

## 2023-08-03 DIAGNOSIS — Z8249 Family history of ischemic heart disease and other diseases of the circulatory system: Secondary | ICD-10-CM

## 2023-08-03 LAB — CBC WITH DIFFERENTIAL/PLATELET
Abs Immature Granulocytes: 0.05 10*3/uL (ref 0.00–0.07)
Basophils Absolute: 0 10*3/uL (ref 0.0–0.1)
Basophils Relative: 0 %
Eosinophils Absolute: 0 10*3/uL (ref 0.0–0.5)
Eosinophils Relative: 0 %
HCT: 39.4 % (ref 39.0–52.0)
Hemoglobin: 13.2 g/dL (ref 13.0–17.0)
Immature Granulocytes: 0 %
Lymphocytes Relative: 0 %
Lymphs Abs: 0.1 10*3/uL — ABNORMAL LOW (ref 0.7–4.0)
MCH: 28.6 pg (ref 26.0–34.0)
MCHC: 33.5 g/dL (ref 30.0–36.0)
MCV: 85.5 fL (ref 80.0–100.0)
Monocytes Absolute: 0.8 10*3/uL (ref 0.1–1.0)
Monocytes Relative: 6 %
Neutro Abs: 13.4 10*3/uL — ABNORMAL HIGH (ref 1.7–7.7)
Neutrophils Relative %: 94 %
Platelets: 346 10*3/uL (ref 150–400)
RBC: 4.61 MIL/uL (ref 4.22–5.81)
RDW: 14.6 % (ref 11.5–15.5)
WBC: 14.4 10*3/uL — ABNORMAL HIGH (ref 4.0–10.5)
nRBC: 0 % (ref 0.0–0.2)

## 2023-08-03 LAB — SODIUM: Sodium: 119 mmol/L — CL (ref 135–145)

## 2023-08-03 LAB — BASIC METABOLIC PANEL
Anion gap: 13 (ref 5–15)
BUN: 17 mg/dL (ref 8–23)
CO2: 23 mmol/L (ref 22–32)
Calcium: 8.7 mg/dL — ABNORMAL LOW (ref 8.9–10.3)
Chloride: 85 mmol/L — ABNORMAL LOW (ref 98–111)
Creatinine, Ser: 0.51 mg/dL — ABNORMAL LOW (ref 0.61–1.24)
GFR, Estimated: >60 mL/min (ref >60)
Glucose, Bld: 327 mg/dL — ABNORMAL HIGH (ref 70–99)
Potassium: 4.1 mmol/L (ref 3.5–5.1)
Sodium: 121 mmol/L — ABNORMAL LOW (ref 135–145)

## 2023-08-03 LAB — TSH: TSH: 1.948 u[IU]/mL (ref 0.350–4.500)

## 2023-08-03 LAB — CBG MONITORING, ED: Glucose-Capillary: 373 mg/dL — ABNORMAL HIGH (ref 70–99)

## 2023-08-03 LAB — LACTIC ACID, PLASMA: Lactic Acid, Venous: 2.1 mmol/L (ref 0.5–1.9)

## 2023-08-03 LAB — SODIUM, URINE, RANDOM: Sodium, Ur: 18 mmol/L

## 2023-08-03 LAB — PROTIME-INR
INR: 3.3 — ABNORMAL HIGH (ref 0.8–1.2)
Prothrombin Time: 34 s — ABNORMAL HIGH (ref 11.4–15.2)

## 2023-08-03 LAB — OSMOLALITY, URINE: Osmolality, Ur: 601 mosm/kg (ref 300–900)

## 2023-08-03 MED ORDER — SODIUM CHLORIDE 0.9 % IV SOLN
500.0000 mg | Freq: Once | INTRAVENOUS | Status: AC
  Administered 2023-08-03: 500 mg via INTRAVENOUS
  Filled 2023-08-03: qty 5

## 2023-08-03 MED ORDER — INSULIN LISPRO 100 UNIT/ML IJ SOLN: 3.0000 [IU] | Freq: Three times a day (TID) | INTRAMUSCULAR | Status: DC

## 2023-08-03 MED ORDER — METHYLPREDNISOLONE SODIUM SUCC 125 MG IJ SOLR
60.0000 mg | Freq: Two times a day (BID) | INTRAMUSCULAR | Status: DC
  Administered 2023-08-04 – 2023-08-08 (×9): 60 mg via INTRAVENOUS
  Filled 2023-08-03 (×9): qty 2

## 2023-08-03 MED ORDER — ALBUTEROL SULFATE (2.5 MG/3ML) 0.083% IN NEBU
5.0000 mg | INHALATION_SOLUTION | Freq: Once | RESPIRATORY_TRACT | Status: AC
  Administered 2023-08-03: 5 mg via RESPIRATORY_TRACT
  Filled 2023-08-03: qty 6

## 2023-08-03 MED ORDER — WARFARIN - PHARMACIST DOSING INPATIENT: Freq: Every day | Status: DC

## 2023-08-03 MED ORDER — CEFTRIAXONE SODIUM 1 G IJ SOLR
1.0000 g | Freq: Once | INTRAMUSCULAR | Status: AC
  Administered 2023-08-03: 1 g via INTRAVENOUS
  Filled 2023-08-03: qty 10

## 2023-08-03 MED ORDER — METHYLPREDNISOLONE SODIUM SUCC 125 MG IJ SOLR
125.0000 mg | Freq: Once | INTRAMUSCULAR | Status: AC
  Administered 2023-08-03: 125 mg via INTRAVENOUS
  Filled 2023-08-03: qty 2

## 2023-08-03 MED ORDER — ALBUTEROL SULFATE (2.5 MG/3ML) 0.083% IN NEBU: 2.5000 mg | INHALATION_SOLUTION | RESPIRATORY_TRACT | Status: DC | PRN

## 2023-08-03 MED ORDER — PANTOPRAZOLE SODIUM 40 MG PO TBEC: 40.0000 mg | DELAYED_RELEASE_TABLET | Freq: Every day | ORAL | Status: DC

## 2023-08-03 MED ORDER — SENNOSIDES-DOCUSATE SODIUM 8.6-50 MG PO TABS: 1.0000 | ORAL_TABLET | Freq: Every evening | ORAL | Status: DC | PRN

## 2023-08-03 MED ORDER — ONDANSETRON HCL 4 MG/2ML IJ SOLN
4.0000 mg | Freq: Four times a day (QID) | INTRAMUSCULAR | Status: DC | PRN
Start: 2023-08-03 — End: 2023-08-20

## 2023-08-03 MED ORDER — INSULIN GLARGINE-YFGN 100 UNIT/ML ~~LOC~~ SOLN: 26.0000 [IU] | Freq: Every day | SUBCUTANEOUS | Status: DC

## 2023-08-03 MED ORDER — LABETALOL HCL 5 MG/ML IV SOLN: 10.0000 mg | INTRAVENOUS | Status: DC | PRN

## 2023-08-03 MED ORDER — SODIUM CHLORIDE 0.9 % IV SOLN
2.0000 g | INTRAVENOUS | Status: DC
  Administered 2023-08-04 – 2023-08-06 (×3): 2 g via INTRAVENOUS
  Filled 2023-08-03 (×3): qty 20

## 2023-08-03 MED ORDER — TAMSULOSIN HCL 0.4 MG PO CAPS
0.8000 mg | ORAL_CAPSULE | Freq: Every day | ORAL | Status: DC
  Administered 2023-08-04 – 2023-08-06 (×3): 0.8 mg via ORAL
  Filled 2023-08-03 (×3): qty 2

## 2023-08-03 MED ORDER — ATORVASTATIN CALCIUM 10 MG PO TABS: 10.0000 mg | ORAL_TABLET | Freq: Every day | ORAL | Status: DC

## 2023-08-03 MED ORDER — LACTATED RINGERS IV SOLN: INTRAVENOUS | Status: DC

## 2023-08-03 MED ORDER — INSULIN ASPART 100 UNIT/ML IJ SOLN
0.0000 [IU] | Freq: Three times a day (TID) | INTRAMUSCULAR | Status: DC
  Administered 2023-08-04 – 2023-08-05 (×3): 8 [IU] via SUBCUTANEOUS
  Administered 2023-08-05: 5 [IU] via SUBCUTANEOUS
  Administered 2023-08-06 (×3): 3 [IU] via SUBCUTANEOUS
  Filled 2023-08-03: qty 0.15

## 2023-08-03 MED ORDER — IPRATROPIUM BROMIDE 0.02 % IN SOLN
0.5000 mg | Freq: Once | RESPIRATORY_TRACT | Status: AC
Start: 2023-08-03 — End: 2023-08-03
  Administered 2023-08-03: 0.5 mg via RESPIRATORY_TRACT
  Filled 2023-08-03: qty 2.5

## 2023-08-03 MED ORDER — IPRATROPIUM BROMIDE 0.02 % IN SOLN
0.5000 mg | Freq: Four times a day (QID) | RESPIRATORY_TRACT | Status: DC
Start: 2023-08-03 — End: 2023-08-04
  Administered 2023-08-03: 0.5 mg via RESPIRATORY_TRACT
  Filled 2023-08-03 (×2): qty 2.5

## 2023-08-03 MED ORDER — SODIUM CHLORIDE 0.9 % IV SOLN: 100.0000 mg | Freq: Two times a day (BID) | INTRAVENOUS | Status: DC

## 2023-08-03 MED ORDER — PREGABALIN 75 MG PO CAPS
75.0000 mg | ORAL_CAPSULE | Freq: Two times a day (BID) | ORAL | Status: DC
Start: 2023-08-03 — End: 2023-08-07
  Administered 2023-08-03 – 2023-08-06 (×6): 75 mg via ORAL
  Filled 2023-08-03 (×6): qty 1

## 2023-08-03 MED ORDER — BUSPIRONE HCL 5 MG PO TABS
15.0000 mg | ORAL_TABLET | Freq: Three times a day (TID) | ORAL | Status: DC
  Administered 2023-08-03 – 2023-08-06 (×9): 15 mg via ORAL
  Filled 2023-08-03: qty 2
  Filled 2023-08-03 (×8): qty 1

## 2023-08-03 MED ORDER — ONDANSETRON HCL 4 MG PO TABS: 4.0000 mg | ORAL_TABLET | Freq: Four times a day (QID) | ORAL | Status: DC | PRN

## 2023-08-03 MED ORDER — SODIUM CHLORIDE 0.9 % IV SOLN: INTRAVENOUS | Status: DC

## 2023-08-03 MED ORDER — LEVALBUTEROL HCL 0.63 MG/3ML IN NEBU
0.6300 mg | INHALATION_SOLUTION | Freq: Four times a day (QID) | RESPIRATORY_TRACT | Status: DC
  Administered 2023-08-03: 0.63 mg via RESPIRATORY_TRACT
  Filled 2023-08-03 (×2): qty 3

## 2023-08-03 MED ORDER — ZOLPIDEM TARTRATE 10 MG PO TABS: 5.0000 mg | ORAL_TABLET | Freq: Every evening | ORAL | 3 refills | Status: DC | PRN

## 2023-08-03 MED ORDER — PANTOPRAZOLE SODIUM 40 MG PO TBEC
40.0000 mg | DELAYED_RELEASE_TABLET | Freq: Two times a day (BID) | ORAL | Status: DC
  Administered 2023-08-03 – 2023-08-06 (×6): 40 mg via ORAL
  Filled 2023-08-03 (×7): qty 1

## 2023-08-03 MED ORDER — INSULIN ASPART 100 UNIT/ML IJ SOLN
3.0000 [IU] | Freq: Three times a day (TID) | INTRAMUSCULAR | Status: DC
  Administered 2023-08-04 – 2023-08-06 (×7): 3 [IU] via SUBCUTANEOUS
  Filled 2023-08-03: qty 0.03

## 2023-08-03 MED ORDER — ACETAMINOPHEN 650 MG RE SUPP
650.0000 mg | Freq: Four times a day (QID) | RECTAL | Status: DC | PRN
Start: 2023-08-03 — End: 2023-08-16

## 2023-08-03 MED ORDER — INSULIN ASPART 100 UNIT/ML IJ SOLN
0.0000 [IU] | Freq: Every day | INTRAMUSCULAR | Status: DC
Start: 2023-08-03 — End: 2023-08-07
  Administered 2023-08-03: 5 [IU] via SUBCUTANEOUS
  Administered 2023-08-04 – 2023-08-05 (×2): 2 [IU] via SUBCUTANEOUS
  Filled 2023-08-03: qty 0.05

## 2023-08-03 MED ORDER — LACTATED RINGERS IV BOLUS
1000.0000 mL | Freq: Once | INTRAVENOUS | Status: AC
  Administered 2023-08-03: 1000 mL via INTRAVENOUS

## 2023-08-03 MED ORDER — MAGNESIUM SULFATE 2 GM/50ML IV SOLN
2.0000 g | Freq: Once | INTRAVENOUS | Status: AC
  Administered 2023-08-03: 2 g via INTRAVENOUS
  Filled 2023-08-03: qty 50

## 2023-08-03 MED ORDER — TIZANIDINE HCL 4 MG PO TABS
6.0000 mg | ORAL_TABLET | ORAL | Status: DC
  Administered 2023-08-03 – 2023-08-06 (×17): 6 mg via ORAL
  Filled 2023-08-03 (×18): qty 2

## 2023-08-03 MED ORDER — DOXYCYCLINE HYCLATE 100 MG IV SOLR
100.0000 mg | Freq: Two times a day (BID) | INTRAVENOUS | Status: DC
  Administered 2023-08-03: 100 mg via INTRAVENOUS
  Filled 2023-08-03 (×2): qty 100

## 2023-08-03 MED ORDER — INSULIN GLARGINE-YFGN 100 UNIT/ML ~~LOC~~ SOLN
26.0000 [IU] | Freq: Every day | SUBCUTANEOUS | Status: DC
  Administered 2023-08-03 – 2023-08-06 (×4): 26 [IU] via SUBCUTANEOUS
  Filled 2023-08-03 (×4): qty 0.26

## 2023-08-03 MED ORDER — VENLAFAXINE HCL ER 150 MG PO CP24
150.0000 mg | ORAL_CAPSULE | Freq: Every day | ORAL | Status: DC
  Administered 2023-08-04 – 2023-08-06 (×3): 150 mg via ORAL
  Filled 2023-08-03: qty 2
  Filled 2023-08-03 (×2): qty 1

## 2023-08-03 MED ORDER — ASPIRIN 81 MG PO TBEC
81.0000 mg | DELAYED_RELEASE_TABLET | Freq: Every day | ORAL | Status: DC
  Administered 2023-08-03 – 2023-08-06 (×4): 81 mg via ORAL
  Filled 2023-08-03 (×4): qty 1

## 2023-08-03 MED ORDER — ACETAMINOPHEN 325 MG PO TABS
650.0000 mg | ORAL_TABLET | Freq: Four times a day (QID) | ORAL | Status: DC | PRN
Start: 2023-08-03 — End: 2023-08-16
  Administered 2023-08-09 – 2023-08-10 (×2): 650 mg via ORAL
  Filled 2023-08-03 (×2): qty 2

## 2023-08-03 NOTE — H&P (Addendum)
History and Physical    Luis Brown:[id number] DOB: 05/20/1946 DOA: 08/03/2023  PCP: Bradd Canary, MD   Patient coming from: Home   Chief Complaint:  Chief Complaint  Patient presents with   Respiratory Distress   ED TRIAGE note:  Pt presents with c/o cough and sob  X 4 days   Pt states he was given prednisone before and reports it helped.             HPI:  Luis Brown is a 78 y.o. male with medical history significant of eosinophilic pneumonitis, interstitial lung disease, pulmonary fibrosis, COPD, chronic hypoxic respiratory failure 2 L oxygen at baseline, GERD, protein calorie malnutrition hyperlipidemia, anxiety, depression, history of pulmonary embolism and DVT on previously on Xarelto and currently on Coumadin, atrial fibrillation on Coumadin, previous history of testosterone use and polymyalgia rheumatica presented to emergency department complaining of upper tract symptoms for last 4 days with associated cough, congestion, shortness of breath.  Patient reported using steroid inhaler and rescue inhaler is not helping much at home.  He was also using over-the-counter Mucinex without any much improvement.  Patient's family called EMS initially placed on nonrebreather which improved dyspnea and shortness of breath. During my evaluation at the bedside patient reported that since the breathing treatment patient's shortness of breath has been significantly improved and currently patient has been transition to nonrebreather to nasal cannula oxygen 5 L O2 sat 100%.  Patient denies any fever and chill.  However he is endorsing cough, congestion, shortness of breath.  Denies any chest pain, palpitation, headache and blurry vision.   Reported that he has not been taking any medication for lung fibrosis anymore as he has poor tolerance and experienced multiple side effects.   ED Course:  At presentation to ED patient O2 sat 98% and currently 82% on nonrebreather. Initially  patient was tachycardic heart rate 1 23-1 47 and elevated blood pressure 175/90.  EKG showing sinus tachycardia heart rate 123. Chest x-ray showing multifocal pneumonia. BMP showing low sodium 121 low chloride 85, elevated blood glucose 227, low creatinine and calcium level. CBC showing leukocytosis 14.4.  Blood cultures are pending.  In the ED patient has been treated with albuterol nebulizer, azithromycin, ceftriaxone, Solu-Medrol 125 mg and mag sulfate 2 g.  Hospitalist has been contacted for further evaluation management of COPD exacerbation, multifocal pneumonia, sinus tachycardia and hyponatremia.  Significant labs in the ED: Lab Orders         Culture, blood (routine x 2)         Respiratory (~20 pathogens) panel by PCR         Expectorated Sputum Assessment w Gram Stain, Rflx to Resp Cult         Basic metabolic panel         CBC with Differential         Protime-INR         Osmolality         Sodium, urine, random         Osmolality, urine         Comprehensive metabolic panel         CBC         TSH         Protime-INR         Legionella Pneumophila Serogp 1 Ur Ag         Strep pneumoniae urinary antigen         Lactic acid, plasma  Sodium         Lactic acid, plasma         CBG monitoring, ED       Review of Systems:  Review of Systems  Constitutional:  Negative for chills, fever and malaise/fatigue.  Respiratory:  Positive for cough, sputum production and shortness of breath. Negative for wheezing.   Cardiovascular:  Negative for chest pain, palpitations and orthopnea.  Gastrointestinal:  Negative for abdominal pain, heartburn, nausea and vomiting.  Musculoskeletal:  Negative for joint pain and myalgias.  Neurological:  Negative for dizziness and headaches.  Endo/Heme/Allergies:  Does not bruise/bleed easily.  Psychiatric/Behavioral:  The patient is not nervous/anxious.   All other systems reviewed and are negative.   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   Esophageal reflux 02/10/2015   Fatigue    HTN (hypertension)    Hyperglycemia    preDM    Hyperlipidemia    Insomnia    Low back pain radiating to both legs 01/16/2017   Measles as a child   Overweight(278.02)    Peripheral neuropathy 08/07/2019   Personal history of colonic polyps 10/27/2012   Follows with [facility]   Pneumonia    " walking"   Preventative health care    Testosterone deficiency 05/23/2012   Wears glasses     Past Surgical History:  Procedure Laterality Date   ATRIAL FIBRILLATION ABLATION N/A 01/27/2022   Procedure: ATRIAL FIBRILLATION ABLATION;  Surgeon: Regan Lemming, MD;  Location: [facility];  Service: Cardiovascular;  Laterality: N/A;   BACK SURGERY  2012 and 1994   Dr Charlesetta Garibaldi, screws and cage in low back   BIOPSY  01/09/2020   Procedure: BIOPSY;  Surgeon: Kathi Der, MD;  Location: [facility];  Service: Gastroenterology;;   BRONCHIAL BIOPSY  11/17/2020   Procedure: BRONCHIAL BIOPSIES;  Surgeon: Tomma Lightning, MD;  Location: [facility];  Service: Endoscopy;;   BRONCHIAL BRUSHINGS  11/17/2020   Procedure: BRONCHIAL BRUSHINGS;  Surgeon: Tomma Lightning, MD;  Location: [facility];  Service: Endoscopy;;   BRONCHIAL WASHINGS  11/17/2020   Procedure: BRONCHIAL WASHINGS;  Surgeon: Tomma Lightning, MD;  Location: [facility];  Service: Endoscopy;;   COLONOSCOPY WITH PROPOFOL N/A 01/09/2020   Procedure: COLONOSCOPY WITH PROPOFOL;  Surgeon: Kathi Der, MD;  Location: [facility];  Service: Gastroenterology;  Laterality: N/A;   ESOPHAGOGASTRODUODENOSCOPY (EGD) WITH PROPOFOL N/A 01/09/2020   Procedure: ESOPHAGOGASTRODUODENOSCOPY (EGD) WITH PROPOFOL;  Surgeon: Kathi Der, MD;  Location: [facility];  Service: Gastroenterology;  Laterality: N/A;   EYE SURGERY Bilateral    2016   HARDWARE REVISION  03/12/2019   REVISION OF HARDWARE THORACIC TEN-THORACIC ELEVEN WITH APPLICATION OF ADDITIONAL RODS AND ROD SLEEVES THORACIC EIGHT-THORACIC TWELVE 12-LUMBAR ONE (N/A )   HEMOSTASIS CONTROL  11/17/2020   Procedure: HEMOSTASIS CONTROL;  Surgeon: Tomma Lightning, MD;  Location: [facility];  Service: Endoscopy;;   IR US GUIDE BX ASP/DRAIN  11/13/2020   LAMINECTOMY  02/12/2019   DECOMPRESSIVE LAMINECTOMY THORACIC NINE-THORACIC TEN AND THORACIC TEN-THORACIC ELEVEN, EXTENSION OF THORACOLUMBAR FUSION FROM THORACIC TEN TO THORACIC FIVE, LOCAL BONE GRAFT, ALLOGRAFT AND VIVIGEN (N/A)   POLYPECTOMY  01/09/2020   Procedure: POLYPECTOMY;  Surgeon: Kathi Der, MD;  [facility]: [facility];  Service: Gastroenterology;;   REMOVAL OF RODS AND PEDICLE SCREWS T5 AND T6, EXPLORATION OF FUSION, BIOPSY TRANSPEDICULAR T5 AND T6 (N/A )  09/14/2020   SPINAL FUSION N/A 02/12/2019   Procedure: DECOMPRESSIVE LAMINECTOMY THORACIC NINE-THORACIC TEN AND THORACIC TEN-THORACIC ELEVEN, EXTENSION OF THORACOLUMBAR FUSION FROM THORACIC TEN TO THORACIC FIVE, LOCAL BONE GRAFT, ALLOGRAFT AND VIVIGEN;  Surgeon: Kerrin Champagne, MD;  Location: [facility];  Service: Orthopedics;  Laterality: N/A;  DECOMPRESSIVE LAMINECTOMY THORACIC NINE-THORACIC TEN AND THORACIC TEN-THORACIC ELEVEN, EXTENSION OF TH   SPINAL FUSION N/A 03/12/2019   Procedure: REVISION OF HARDWARE THORACIC TEN-THORACIC ELEVEN WITH APPLICATION OF ADDITIONAL RODS AND ROD SLEEVES THORACIC EIGHT-THORACIC TWELVE 12-LUMBAR ONE;  Surgeon: Kerrin Champagne, MD;  Location: [facility];  Service: Orthopedics;  Laterality: N/A;   TONSILLECTOMY     as child   torn  rotator cuff  2010   right   VIDEO BRONCHOSCOPY N/A 11/17/2020   Procedure: VIDEO BRONCHOSCOPY WITH FLUORO;  Surgeon: Tomma Lightning, MD;  Location: [facility];  Service: Endoscopy;  Laterality: N/A;     reports that he quit smoking about 34 years ago. His smoking use included cigarettes. He started smoking about 54 years ago. He has a 20 pack-year smoking history. He has never used smokeless tobacco. He reports that he does not currently use alcohol. He reports that he does not use drugs.  Allergies  Allergen Reactions   Daptomycin     Drug induced pneumonia    Diltiazem Other (See Comments)    Passed out   Ofev [Nintedanib] Diarrhea    fatigue   Pirfenidone Diarrhea    fatigue    Family History  Problem Relation Age of Onset   Hypertension Mother    Diabetes Mother        type 2   Cancer Mother 21       breast in remission   Emphysema Father        smoker   COPD Father        smoker   Stroke Father 45       mini   Heart disease Father    Hypertension Sister    Hyperlipidemia Sister    Scoliosis Sister    Osteoporosis Sister    Hypertension Maternal Grandmother    Scoliosis Maternal Grandmother    Heart disease Maternal Grandfather    Heart disease Paternal Grandfather        smoker   Heart disease Daughter     Prior to Admission medications   Medication Sig Start Date End Date Taking? Authorizing Provider  albuterol (PROVENTIL HFA) 108 (90 Base) MCG/ACT inhaler TAKE 2 PUFFS BY MOUTH EVERY 6 HOURS AS NEEDED FOR WHEEZE OR SHORTNESS OF BREATH 03/16/23   Kalman Shan, MD  amoxicillin-clavulanate (AUGMENTIN) 875-125 MG tablet Take 1 tablet by mouth every 12 (twelve) hours. 08/02/23   Radford Pax, NP  atorvastatin (LIPITOR) 10 MG tablet TAKE 1 TABLET BY MOUTH EVERY DAY 08/16/22   Bradd Canary, MD  busPIRone (BUSPAR) 15 MG tablet Take 1 tablet (15 mg total) by mouth 3 (three) times daily. 04/26/23   Bradd Canary, MD  Cyanocobalamin (VITAMIN B-12 PO) Take by  mouth.  [provider]  dextromethorphan-guaiFENesin (MUCINEX DM) 30-600 MG 12hr tablet Take 1 tablet by mouth 2 (two) times daily.    [provider]  famciclovir (FAMVIR) 500 MG tablet Take 1 tablet (500 mg total) by mouth 3 (three) times daily. 05/19/23   Saguier, Ramon Dredge, PA-C  fluticasone [facility]) 50 MCG/ACT nasal spray SPRAY 2 SPRAYS INTO EACH NOSTRIL EVERY DAY 05/02/22   Saguier, Ramon Dredge, PA-C  Fluticasone-Umeclidin-Vilant (TRELEGY ELLIPTA) 200-62.5-25 MCG/ACT AEPB Inhale 1 puff into the lungs daily. 03/27/23   Kalman Shan, MD  glucose blood (CONTOUR NEXT TEST) test strip Check blood sugar once daily 02/25/19   Saguier, Ramon Dredge, PA-C  insulin glargine (LANTUS SOLOSTAR) 100 UNIT/ML Solostar Pen Inject 26 Units into the skin daily. 02/17/23   Bradd Canary, MD  insulin lispro (HUMALOG) 100 UNIT/ML injection Inject 0.03 mLs (3 Units total) into the skin 3 (three) times daily with meals. 11/04/22   Bradd Canary, MD  Insulin Pen Needle (B-D UF III MINI PEN NEEDLES) 31G X 5 MM MISC USE WITH LANTUS 12/01/22   Bradd Canary, MD  Insulin Pen Needle 32G X 4 MM MISC Use with insulin 12/15/22   Bradd Canary, MD  Insulin Syringe-Needle U-100 (B-D INS SYR ULTRAFINE 1CC/30G) 30G X 1/2" 1 ML MISC USE WITH LANTUS 02/01/23   Bradd Canary, MD  Insulin Syringes, Disposable, U-100 1 ML MISC USE WITH LANTUS 12/02/22   Bradd Canary, MD  meclizine (ANTIVERT) 12.5 MG tablet Take 1 tablet (12.5 mg total) by mouth 3 (three) times daily as needed for dizziness. 04/13/23   Bradd Canary, MD  metFORMIN (GLUCOPHAGE) 1000 MG tablet TAKE 1 TABLET (1,000 MG TOTAL) BY MOUTH TWICE A DAY WITH FOOD 10/18/21   Saguier, Ramon Dredge, PA-C  methylPREDNISolone (MEDROL DOSEPAK) 4 MG TBPK tablet 6 tabs po x 1 day then 5 tabs po x 1 day then 4 tabs po x 1 day then 3 tabs po x 1 day then 2 tabs po x 1 day then 1 tab po x 1 day and stop 08/02/23   Bradd Canary, MD  oxyCODONE-acetaminophen (PERCOCET/ROXICET) 5-325 MG  tablet Take 1 tablet by mouth every 4 (four) hours as needed for pain- frequency change 08/17/22     pantoprazole (PROTONIX) 40 MG tablet TAKE 1 TABLET (40 MG TOTAL) BY MOUTH TWICE A DAY BEFORE MEALS 08/02/23   Bradd Canary, MD  Pirfenidone 267 MG TABS Take 1 tab three times daily for 7 days, then 2 tabs three times daily thereafter **low dose maintenance** 07/05/23   Kalman Shan, MD  Pirfenidone 267 MG TABS Take 2 tablets (534 mg total) by mouth 3 (three) times daily with meals. 07/05/23   Kalman Shan, MD  predniSONE (DELTASONE) 5 MG tablet TAKE 1 TABLET (5 MG TOTAL) BY MOUTH DAILY. 07/24/23   Kalman Shan, MD  pregabalin (LYRICA) 75 MG capsule TAKE 1 CAPSULE BY MOUTH TWICE A DAY Patient taking differently: Take 75 mg by mouth 2 (two) times daily. 03/09/20   Kerrin Champagne, MD  sitaGLIPtin (JANUVIA) 50 MG tablet Take 1 tablet (50 mg total) by mouth daily. 07/04/23   Bradd Canary, MD  tamsulosin (FLOMAX) 0.4 MG CAPS capsule TAKE 2 CAPSULES BY MOUTH EVERY DAY 06/02/23   Bradd Canary, MD  tirzepatide [facility]) 2.5 MG/0.5ML Pen Inject 2.5 mg into the skin once a week. 05/25/23   Bradd Canary, MD  tiZANidine (ZANAFLEX) 4 MG tablet Take 1 and 1/2 tablets (6 mg  total) by mouth every 4 hours. 08/11/22     venlafaxine XR (EFFEXOR-XR) 150 MG 24 hr capsule Take 1 capsule (150 mg total) by mouth daily with breakfast. 06/06/23   Bradd Canary, MD  warfarin (COUMADIN) 5 MG tablet TAKE 1 TO 2 TABLETS DAILY OR AS PRESCRIBED BY COUMADIN CLINIC 02/16/23   Tobb, Kardie, DO  zolpidem (AMBIEN) 10 MG tablet Take 0.5-1 tablets (5-10 mg total) by mouth at bedtime as needed for sleep. 08/03/23   Eulis Foster, FNP     Physical Exam: Vitals:   08/04/23 0245 08/04/23 0300 08/04/23 0344 08/04/23 0351  BP: (!) 109/56 (!) 98/58  119/63  Pulse: (!) 58 (!) 59  77  Resp: (!) 21 (!) 21  19  Temp:   (!) 97.5 F (36.4 C) (!) 97.5 F (36.4 C)  TempSrc:   Oral Oral  SpO2: 100% 100%  99%  Weight:       Height:        Physical Exam Constitutional:      Appearance: He is ill-appearing.  HENT:     Mouth/Throat:     Mouth: Mucous membranes are moist.  Eyes:     Pupils: Pupils are equal, round, and reactive to light.  Cardiovascular:     Rate and Rhythm: Regular rhythm. Tachycardia present.     Pulses: Normal pulses.     Heart sounds: Normal heart sounds.  Pulmonary:     Effort: Pulmonary effort is normal. No respiratory distress.     Breath sounds: No stridor. Wheezing present. No rhonchi or rales.  Musculoskeletal:     Cervical back: Neck supple.     Right lower leg: No edema.     Left lower leg: No edema.  Skin:    Capillary Refill: Capillary refill takes less than 2 seconds.  Neurological:     Mental Status: He is alert and oriented to person, place, and time.  Psychiatric:        Mood and Affect: Mood normal.      Labs on Admission: I have personally reviewed following labs and imaging studies  CBC: Recent Labs  Lab 08/03/23 1945 08/04/23 0346  WBC 14.4* 13.6*  NEUTROABS 13.4*  --   HGB 13.2 11.9*  HCT 39.4 34.7*  MCV 85.5 86.3  PLT 346 338   Basic Metabolic Panel: Recent Labs  Lab 08/03/23 1945 08/03/23 2200 08/04/23 0346  NA 121* 119* 120*  K 4.1  --  4.0  CL 85*  --  87*  CO2 23  --  22  GLUCOSE 327*  --  367*  BUN 17  --  16  CREATININE 0.51*  --  0.49*  CALCIUM 8.7*  --  8.7*   GFR: Estimated Creatinine Clearance: 83 mL/min (A) (by C-G formula based on SCr of 0.49 mg/dL (L)). Liver Function Tests: Recent Labs  Lab 08/04/23 0346  AST 26  ALT 18  ALKPHOS 67  BILITOT 1.1  PROT 6.0*  ALBUMIN 3.4*   No results for input(s): "LIPASE", "AMYLASE" in the last 168 hours. No results for input(s): "AMMONIA" in the last 168 hours. Coagulation Profile: Recent Labs  Lab 08/03/23 1945 08/04/23 0346  INR 3.3* 3.4*   Cardiac Enzymes: No results for input(s): "CKTOTAL", "CKMB", "CKMBINDEX", "TROPONINI", "TROPONINIHS" in the last 168 hours. BNP  (last 3 results) No results for input(s): "BNP" in the last 8760 hours. HbA1C: No results for input(s): "HGBA1C" in the last 72 hours. CBG: Recent Labs  Lab 08/03/23  2143  GLUCAP 373*   Lipid Profile: No results for input(s): "CHOL", "HDL", "LDLCALC", "TRIG", "CHOLHDL", "LDLDIRECT" in the last 72 hours. Thyroid Function Tests: Recent Labs    08/03/23 2200  TSH 1.948   Anemia Panel: No results for input(s): "VITAMINB12", "FOLATE", "FERRITIN", "TIBC", "IRON", "RETICCTPCT" in the last 72 hours. Urine analysis:    Component Value Date/Time   COLORURINE YELLOW 10/23/2022 1319   APPEARANCEUR CLEAR 10/23/2022 1319   LABSPEC 1.015 10/23/2022 1319   PHURINE 7.0 10/23/2022 1319   GLUCOSEU >=500 (A) 10/23/2022 1319   GLUCOSEU NEGATIVE 08/23/2022 0948   HGBUR TRACE (A) 10/23/2022 1319   BILIRUBINUR NEGATIVE 10/23/2022 1319   BILIRUBINUR negative 10/08/2019 1430   KETONESUR NEGATIVE 10/23/2022 1319   PROTEINUR NEGATIVE 10/23/2022 1319   UROBILINOGEN 0.2 08/23/2022 0948   NITRITE NEGATIVE 10/23/2022 1319   LEUKOCYTESUR NEGATIVE 10/23/2022 1319    Radiological Exams on Admission: I have personally reviewed images DG Chest Portable 1 View Result Date: 08/03/2023 CLINICAL DATA:  Shortness of breath EXAM: PORTABLE CHEST 1 VIEW COMPARISON:  08/02/2023 FINDINGS: Multifocal patchy opacities, right upper lobe and left lower lobe predominant, new. This appearance suggests multifocal pneumonia. No pleural effusion or pneumothorax. The heart is normal in size. Lumbar spine fixation hardware with fractured spinal fixation rods, unchanged. IMPRESSION: Multifocal pneumonia, new. Electronically Signed   By: Charline Bills M.D.   On: 08/03/2023 20:25   DG Chest 2 View Result Date: 08/02/2023 CLINICAL DATA:  Cough, shortness of breath EXAM: CHEST - 2 VIEW COMPARISON:  05/19/2023 FINDINGS: The heart size and mediastinal contours are within normal limits. Chronically coarsened interstitial markings  bilaterally. No focal airspace consolidation, pleural effusion, or pneumothorax. Thoracolumbar fusion with chronically fractured rods bilaterally. Abandoned screw fragments present above the fused levels, unchanged. IMPRESSION: Chronically coarsened interstitial markings bilaterally. No superimposed acute cardiopulmonary findings. Electronically Signed   By: Duanne Guess D.O.   On: 08/02/2023 13:36     EKG: My personal interpretation of EKG shows: Sinus tachycardia heart rate 123.    Assessment/Plan: Principal Problem:   Multifocal pneumonia Active Problems:   Hyponatremia   COPD with acute exacerbation (HCC)   Sepsis due to pneumonia (HCC)   Paroxysmal atrial fibrillation (HCC)   Insomnia   BPH (benign prostatic hyperplasia)   GERD (gastroesophageal reflux disease)   History of DVT (deep vein thrombosis)   Hyperlipidemia   Pulmonary fibrosis (HCC)   History of pulmonary embolism   Hypochloremia   Generalized anxiety disorder   Insulin dependent type 2 diabetes mellitus (HCC)    Assessment and Plan: Sepsis secondary to pneumonia Multifocal pneumonia > Patient is present emergency department with cough, congestion, shortness of breath for last 4 days not improving with inhaler and nebulizer at home. - EMS found patient was hypoxic O2 sat 89% and placed on nonrebreather with improvement of dyspnea. - Patient has leukocytosis 14.4, elevated lactic acid 2.1, tachycardic 123 and chest x-ray showed multifocal pneumonia.  Patient meets sepsis criteria. - Giving patient 1 L of LR bolus and continue NS 100 cc/h (not overloading with IV fluid given patient has history of diastolic heart failure). - Continue to trend lactic acid, WBC count and fever curve - Outpatient RT-PCR negative for flu and COVID.   -Checking 20 respiratory panel, checking blood cultures, sputum culture, urine Legionella urine strep antigen test. -In the ED patient received ceftriaxone 1 g and azithromycin 500  mg. -Plan to continue IV pain ceftriaxone 2 g daily and doxycycline 100 mg twice daily -  Will follow-up with culture results for antibiotic guidance. Addendum - Patient's lactic acid is persistently elevated even after 1 L of LR bolus.  Giving another liter of LR bolus and plan to continue NS 125 cc/h.  COPD exacerbation History of pulmonary fibrosis -Patient has history of pulmonary fibrosis, COPD and interstitial lung disease.  History of intolerance to Pirfenidone and currently at home patient is on Trelegy once daily and albuterol as needed. - Physical exam showing bilateral upper lung field wheezing. - Concern for COPD exacerbation in the setting of pneumonia - In the ED patient has been treated with methylprednisolone 125 mg and DuoNeb nebulizer. - Checking ABG - Patient initially on nonrebreather oxygen currently on nasal cannula oxygen 5 to 8 L O2 sat 99 to 100 percent. -Continue Xopenex and ipratropium every 6 hours scheduled and DuoNeb as needed.  Continue albuterol as needed. -Continue Mucinex, pulmonary toiletry and supportive care  Euvolemic hyponatremia -Serum sodium 121.  Patient baseline sodium is around 1 28-1 32. - Hyponatremia likely in the setting of poor oral intake, -Continue NS 100 cc/h, continue check serum sodium level every 6 hour. - Checking serum osmolarity, urine osmolarity and urine sodium.   Paroxysmal atrial fibrillation -EKG showing sinus tachycardia heart rate 123.  However at bedside telemetry showing heart rate between 75-80. - CHA2DS2-VASc score = 5, based on age, history of CHF and diabetes. -Continue warfarin pharmacy protocol. -At home patient is not any rate control medication as patient A-fib is rate controlled without any medications. -On discharge patient is to follow-up with outpatient PCP and cardiology for the management of A-fib.   History of DVT and pulmonary embolism -Previously patient failed Xarelto and redeveloped DVT.  Currently  patient is on Coumadin and aspirin.  Insulin dependent DM type II -A1c 8.8 checked in November 2024 - Continue same 26 unit daily, NovoLog 3 units 3 times daily with meals, moderate sliding scale and at bedtime insulin as needed coverage.  Hyperlipidemia -Not taking any lipid-lowering agent anymore. BPH -Continue Flomax 0.8 mg daily  Generalized anxiety disorder Insomnia -Continue Effexor 150 mg daily, bupropion 50 mg 3 times daily   Peripheral neuropathy - Continue Lyrica 75 mg twice daily and Zanaflex as needed  History of diastolic heart failure reduced EF   DVT prophylaxis:  Coumadin Code Status:  Full Code Diet: Heart healthy carb modified diet Family Communication:   None present Disposition Plan: Pending improvement of COPD exacerbation and pneumonia workup. Consults: None Admission status:   Inpatient, Step Down Unit  Severity of Illness: The appropriate patient status for this patient is INPATIENT. Inpatient status is judged to be reasonable and necessary in order to provide the required intensity of service to ensure the patient's safety. The patient's presenting symptoms, physical exam findings, and initial radiographic and laboratory data in the context of their chronic comorbidities is felt to place them at high risk for further clinical deterioration. Furthermore, it is not anticipated that the patient will be medically stable for discharge from the hospital within 2 midnights of admission.   * I certify that at the point of admission it is my clinical judgment that the patient will require inpatient hospital care spanning beyond 2 midnights from the point of admission due to high intensity of service, high risk for further deterioration and high frequency of surveillance required.Marland Kitchen    Tereasa Coop, MD [facility]  How to contact the [facility] Attending or Consulting provider 7A - 7P or covering provider during after hours 7P -  7A, for this patient.  Check  the care team in [facility] and look for a) attending/consulting [facility] provider listed and b) the [facility] team listed Log into [email or web address] and use 's universal password to access. If you do not have the password, please contact the hospital operator. Locate the [facility] provider you are looking for under [facility] and page to a number that you can be directly reached. If you still have difficulty reaching the provider, please page the [facility] (Director on Call) for the Hospitalists listed on amion for assistance.  08/04/2023, 5:14 AM

## 2023-08-03 NOTE — Progress Notes (Signed)
PHARMACY - ANTICOAGULATION CONSULT NOTE  Pharmacy Consult for warfarin Indication: Afib, PE, DVT  Allergies  Allergen Reactions   Daptomycin     Drug induced pneumonia    Diltiazem Other (See Comments)    Passed out   Ofev [Nintedanib] Diarrhea    fatigue   Pirfenidone Diarrhea    fatigue    Patient Measurements: Height: 5\' 9"  (175.3 cm) Weight: 90 kg (198 lb 6.6 oz) IBW/kg (Calculated) : 70.7    Vital Signs: Temp: 98.2 F (36.8 C) (01/16 2230) Temp Source: Oral (01/16 1852) BP: 107/76 (01/16 2230) Pulse Rate: 94 (01/16 2230)  Labs: Recent Labs    08/03/23 1945  HGB 13.2  HCT 39.4  PLT 346  LABPROT 34.0*  INR 3.3*  CREATININE 0.51*    Estimated Creatinine Clearance: 83 mL/min (A) (by C-G formula based on SCr of 0.51 mg/dL (L)).   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  Pharmacy consulted to manage warfarin. Hg 13.2, PLT 346, SCr 0.51  1/16 INR = 3.3 (supratherapeutic) PTA warfarin regimen:  5mg   daily except 7.5mg  Tues/Sat - last dose taken 1/16 @ 0830AM  Goal of Therapy:  INR 2-3   Plan:  No additional warfarin tonight Daily INR  Terrilee Files, PharmD 08/03/2023 10:48 PM

## 2023-08-03 NOTE — Progress Notes (Signed)
PHARMACY - ANTICOAGULATION CONSULT NOTE  Pharmacy Consult for warfarin Indication: Afib, PE, DVT  Allergies  Allergen Reactions   Daptomycin     Drug induced pneumonia    Diltiazem Other (See Comments)    Passed out   Ofev [Nintedanib] Diarrhea    fatigue   Pirfenidone Diarrhea    fatigue    Patient Measurements: Height: 5\' 9"  (175.3 cm) Weight: 90 kg (198 lb 6.6 oz) IBW/kg (Calculated) : 70.7 Heparin Dosing Weight:   Vital Signs: Temp: 98.1 F (36.7 C) (01/16 1852) Temp Source: Oral (01/16 1852) BP: 175/90 (01/16 1900) Pulse Rate: 80 (01/16 1900)  Labs: Recent Labs    08/03/23 1945  HGB 13.2  HCT 39.4  PLT 346  CREATININE 0.51*    Estimated Creatinine Clearance: 83 mL/min (A) (by C-G formula based on SCr of 0.51 mg/dL (L)).   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  Pharmacy consulted to manage warfarin. INR has been collected but not yet resulted Hg 13.2, PLT 346, SCr 0.51 Med rec not yet  completed   Goal of Therapy:  INR 2-3   Plan:  F/u INR Daily INR  Herby Abraham, Pharm.D Use secure chat for questions 08/03/2023 9:31 PM

## 2023-08-03 NOTE — ED Provider Notes (Signed)
[place] EMERGENCY DEPARTMENT AT [facility] Provider Note   CSN: [id number] Arrival date & time: 08/03/23  1841     History  Chief Complaint  Patient presents with   Respiratory Distress    Luis Brown is a 78 y.o. male.  HPI Patient history of pulm fibrosis.  He said a few days of shortness of breath and cough.  Had been seen in urgent care.  More shortness of breath today.  More dyspneic.  Reportedly had be placed on nonrebreather by EMS.  Has been on antibiotics and prednisone.  Normally on 2 L of oxygen.   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 Authorizing Provider  albuterol (PROVENTIL HFA) 108 (90 Base) MCG/ACT inhaler TAKE 2 PUFFS BY MOUTH EVERY 6 HOURS AS NEEDED FOR WHEEZE OR SHORTNESS OF BREATH 03/16/23   Kalman Shan, MD  amoxicillin-clavulanate (AUGMENTIN) 875-125 MG tablet Take 1 tablet by mouth every 12 (twelve) hours. 08/02/23   Radford Pax, NP  atorvastatin  (LIPITOR) 10 MG tablet TAKE 1 TABLET BY MOUTH EVERY DAY 08/16/22   Bradd Canary, MD  busPIRone (BUSPAR) 15 MG tablet Take 1 tablet (15 mg total) by mouth 3 (three) times daily. 04/26/23   Bradd Canary, MD  Cyanocobalamin (VITAMIN B-12 PO) Take by mouth.    [provider]  dextromethorphan-guaiFENesin (MUCINEX DM) 30-600 MG 12hr tablet Take 1 tablet by mouth 2 (two) times daily.    [provider]  famciclovir (FAMVIR) 500 MG tablet Take 1 tablet (500 mg total) by mouth 3 (three) times daily. 05/19/23   Saguier, Ramon Dredge, PA-C  fluticasone [facility]) 50 MCG/ACT nasal spray SPRAY 2 SPRAYS INTO EACH NOSTRIL EVERY DAY 05/02/22   Saguier, Ramon Dredge, PA-C  Fluticasone-Umeclidin-Vilant (TRELEGY ELLIPTA) 200-62.5-25 MCG/ACT AEPB Inhale 1 puff into the lungs daily. 03/27/23   Kalman Shan, MD  glucose blood (CONTOUR NEXT TEST) test strip Check blood sugar once daily 02/25/19   Saguier, Ramon Dredge, PA-C  insulin glargine (LANTUS SOLOSTAR) 100 UNIT/ML Solostar Pen Inject 26 Units into the skin daily. 02/17/23   Bradd Canary, MD  insulin lispro (HUMALOG) 100 UNIT/ML injection Inject 0.03 mLs (3 Units total) into the skin 3 (three) times daily with meals. 11/04/22   Bradd Canary, MD  Insulin Pen Needle (B-D UF III MINI PEN NEEDLES) 31G X  5 MM MISC USE WITH LANTUS 12/01/22   Bradd Canary, MD  Insulin Pen Needle 32G X 4 MM MISC Use with insulin 12/15/22   Bradd Canary, MD  Insulin Syringe-Needle U-100 (B-D INS SYR ULTRAFINE 1CC/30G) 30G X 1/2" 1 ML MISC USE WITH LANTUS 02/01/23   Bradd Canary, MD  Insulin Syringes, Disposable, U-100 1 ML MISC USE WITH LANTUS 12/02/22   Bradd Canary, MD  meclizine (ANTIVERT) 12.5 MG tablet Take 1 tablet (12.5 mg total) by mouth 3 (three) times daily as needed for dizziness. 04/13/23   Bradd Canary, MD  metFORMIN (GLUCOPHAGE) 1000 MG tablet TAKE 1 TABLET (1,000 MG TOTAL) BY MOUTH TWICE A DAY WITH FOOD 10/18/21   Saguier, Ramon Dredge, PA-C  methylPREDNISolone  (MEDROL DOSEPAK) 4 MG TBPK tablet 6 tabs po x 1 day then 5 tabs po x 1 day then 4 tabs po x 1 day then 3 tabs po x 1 day then 2 tabs po x 1 day then 1 tab po x 1 day and stop 08/02/23   Bradd Canary, MD  oxyCODONE-acetaminophen (PERCOCET/ROXICET) 5-325 MG tablet Take 1 tablet by mouth every 4 (four) hours as needed for pain- frequency change 08/17/22     pantoprazole (PROTONIX) 40 MG tablet TAKE 1 TABLET (40 MG TOTAL) BY MOUTH TWICE A DAY BEFORE MEALS 08/02/23   Bradd Canary, MD  Pirfenidone 267 MG TABS Take 1 tab three times daily for 7 days, then 2 tabs three times daily thereafter **low dose maintenance** 07/05/23   Kalman Shan, MD  Pirfenidone 267 MG TABS Take 2 tablets (534 mg total) by mouth 3 (three) times daily with meals. 07/05/23   Kalman Shan, MD  predniSONE (DELTASONE) 5 MG tablet TAKE 1 TABLET (5 MG TOTAL) BY MOUTH DAILY. 07/24/23   Kalman Shan, MD  pregabalin (LYRICA) 75 MG capsule TAKE 1 CAPSULE BY MOUTH TWICE A DAY Patient taking differently: Take 75 mg by mouth 2 (two) times daily. 03/09/20   Kerrin Champagne, MD  sitaGLIPtin (JANUVIA) 50 MG tablet Take 1 tablet (50 mg total) by mouth daily. 07/04/23   Bradd Canary, MD  tamsulosin (FLOMAX) 0.4 MG CAPS capsule TAKE 2 CAPSULES BY MOUTH EVERY DAY 06/02/23   Bradd Canary, MD  tirzepatide [facility]) 2.5 MG/0.5ML Pen Inject 2.5 mg into the skin once a week. 05/25/23   Bradd Canary, MD  tiZANidine (ZANAFLEX) 4 MG tablet Take 1 and 1/2 tablets (6 mg total) by mouth every 4 hours. 08/11/22     venlafaxine XR (EFFEXOR-XR) 150 MG 24 hr capsule Take 1 capsule (150 mg total) by mouth daily with breakfast. 06/06/23   Bradd Canary, MD  warfarin (COUMADIN) 5 MG tablet TAKE 1 TO 2 TABLETS DAILY OR AS PRESCRIBED BY COUMADIN CLINIC 02/16/23   Tobb, Kardie, DO  zolpidem (AMBIEN) 10 MG tablet Take 0.5-1 tablets (5-10 mg total) by mouth at bedtime as needed for sleep. 08/03/23   Worthy Rancher B, FNP      Allergies    Daptomycin,  Diltiazem, Ofev [nintedanib], and Pirfenidone    Review of Systems   Review of Systems  Physical Exam Updated Vital Signs BP (!) 175/90   Pulse 80   Temp 98.1 F (36.7 C) (Oral)   Resp (!) 26   Ht 5\' 9"  (1.753 m)   Wt 90 kg   SpO2 (!) 82%   BMI 29.30 kg/m  Physical Exam Vitals and nursing note reviewed.  Cardiovascular:  Rate and Rhythm: Tachycardia present.  Pulmonary:     Breath sounds: No wheezing or rhonchi.     Comments: Mildly harsh breath sounds.  Is somewhat dyspneic. Abdominal:     Tenderness: There is no abdominal tenderness.  Musculoskeletal:     Right lower leg: No edema.     Left lower leg: No edema.  Skin:    Capillary Refill: Capillary refill takes less than 2 seconds.  Neurological:     Mental Status: He is alert and oriented to person, place, and time.     ED Results / Procedures / Treatments   Labs (all labs ordered are listed, but only abnormal results are displayed) Labs Reviewed  BASIC METABOLIC PANEL - Abnormal; Notable for the following components:      Result Value   Sodium 121 (*)    Chloride 85 (*)    Glucose, Bld 327 (*)    Creatinine, Ser 0.51 (*)    Calcium 8.7 (*)    All other components within normal limits  CBC WITH DIFFERENTIAL/PLATELET - Abnormal; Notable for the following components:   WBC 14.4 (*)    Neutro Abs 13.4 (*)    Lymphs Abs 0.1 (*)    All other components within normal limits  CULTURE, BLOOD (ROUTINE X 2)  CULTURE, BLOOD (ROUTINE X 2)  PROTIME-INR    EKG EKG Interpretation Date/Time:  Thursday August 03 2023 18:51:21 EST Ventricular Rate:  123 PR Interval:  131 QRS Duration:  96 QT Interval:  301 QTC Calculation: 431 R Axis:   0  Text Interpretation: Sinus tachycardia Consider anterior infarct Confirmed by Benjiman Core [id number]) on 08/03/2023 7:37:44 PM  Radiology DG Chest Portable 1 View Result Date: 08/03/2023 CLINICAL DATA:  Shortness of breath EXAM: PORTABLE CHEST 1 VIEW COMPARISON:   08/02/2023 FINDINGS: Multifocal patchy opacities, right upper lobe and left lower lobe predominant, new. This appearance suggests multifocal pneumonia. No pleural effusion or pneumothorax. The heart is normal in size. Lumbar spine fixation hardware with fractured spinal fixation rods, unchanged. IMPRESSION: Multifocal pneumonia, new. Electronically Signed   By: Charline Bills M.D.   On: 08/03/2023 20:25   DG Chest 2 View Result Date: 08/02/2023 CLINICAL DATA:  Cough, shortness of breath EXAM: CHEST - 2 VIEW COMPARISON:  05/19/2023 FINDINGS: The heart size and mediastinal contours are within normal limits. Chronically coarsened interstitial markings bilaterally. No focal airspace consolidation, pleural effusion, or pneumothorax. Thoracolumbar fusion with chronically fractured rods bilaterally. Abandoned screw fragments present above the fused levels, unchanged. IMPRESSION: Chronically coarsened interstitial markings bilaterally. No superimposed acute cardiopulmonary findings. Electronically Signed   By: Duanne Guess D.O.   On: 08/02/2023 13:36    Procedures Procedures    Medications Ordered in ED Medications  cefTRIAXone (ROCEPHIN) 1 g in sodium chloride 0.9 % 100 mL IVPB (has no administration in time range)  azithromycin (ZITHROMAX) 500 mg in sodium chloride 0.9 % 250 mL IVPB (has no administration in time range)  methylPREDNISolone sodium succinate (SOLU-MEDROL) 125 mg/2 mL injection 125 mg (125 mg Intravenous Given 08/03/23 1941)  magnesium sulfate IVPB 2 g 50 mL (2 g Intravenous New Bag/Given 08/03/23 1944)  albuterol (PROVENTIL) (2.5 MG/3ML) 0.083% nebulizer solution 5 mg (5 mg Nebulization Given 08/03/23 1930)  ipratropium (ATROVENT) nebulizer solution 0.5 mg (0.5 mg Nebulization Given 08/03/23 1930)    ED Course/ Medical Decision Making/ A&P  Medical Decision Making Amount and/or Complexity of Data Reviewed Labs: ordered. Radiology:  ordered.  Risk Prescription drug management.   Patient shortness of breath and cough.  History of pulmonary fibrosis, on oxygen.  More short of breath.  Mild sputum production.  Reviewed urgent care note from yesterday had a negative viral testing at that time.  Now on nonrebreather.  Will get x-ray and basic blood work.   X-ray shows multifocal pneumonia.  White count mildly elevated.  Does have a hyponatremia with sodium of 120.  2 months ago was 124.  Also hyperglycemic with sugar of 300.  Does not appear to be in DKA.  Does have increasing oxygen requirement.  Given breathing treatment and feeling somewhat better but still requiring more oxygen.  Antibiotics given.  Will discuss with hospitalist for admission.  CRITICAL CARE Performed by: Benjiman Core Total critical care time: 30 minutes Critical care time was exclusive of separately billable procedures and treating other patients. Critical care was necessary to treat or prevent imminent or life-threatening deterioration. Critical care was time spent personally by me on the following activities: development of treatment plan with patient and/or surrogate as well as nursing, discussions with consultants, evaluation of patient's response to treatment, examination of patient, obtaining history from patient or surrogate, ordering and performing treatments and interventions, ordering and review of laboratory studies, ordering and review of radiographic studies, pulse oximetry and re-evaluation of patient's condition.         Final Clinical Impression(s) / ED Diagnoses Final diagnoses:  Multifocal pneumonia  Pulmonary fibrosis [facility])    Rx / DC Orders ED Discharge Orders     None         Benjiman Core, MD 08/03/23 2049

## 2023-08-03 NOTE — ED Triage Notes (Signed)
Patient to ED by EMS from home with c/o respiratory distress. Per EMS patient has HX of COPD, pulmonary fibrosis and asthma. He uses 2L O2 at home but EMS had to place pt. on NRB. He has had a cold for 2-3 days was given ABX and prednisone. Bilateral lower lungs diminished. 144/78 130 30 98% NRB CBG: 364

## 2023-08-04 DIAGNOSIS — I480 Paroxysmal atrial fibrillation: Secondary | ICD-10-CM | POA: Diagnosis not present

## 2023-08-04 DIAGNOSIS — J189 Pneumonia, unspecified organism: Secondary | ICD-10-CM | POA: Diagnosis not present

## 2023-08-04 DIAGNOSIS — J441 Chronic obstructive pulmonary disease with (acute) exacerbation: Secondary | ICD-10-CM | POA: Diagnosis not present

## 2023-08-04 DIAGNOSIS — J121 Respiratory syncytial virus pneumonia: Secondary | ICD-10-CM | POA: Insufficient documentation

## 2023-08-04 LAB — CBC
HCT: 34.7 % — ABNORMAL LOW (ref 39.0–52.0)
Hemoglobin: 11.9 g/dL — ABNORMAL LOW (ref 13.0–17.0)
MCH: 29.6 pg (ref 26.0–34.0)
MCHC: 34.3 g/dL (ref 30.0–36.0)
MCV: 86.3 fL (ref 80.0–100.0)
Platelets: 338 10*3/uL (ref 150–400)
RBC: 4.02 MIL/uL — ABNORMAL LOW (ref 4.22–5.81)
RDW: 14.7 % (ref 11.5–15.5)
WBC: 13.6 10*3/uL — ABNORMAL HIGH (ref 4.0–10.5)
nRBC: 0 % (ref 0.0–0.2)

## 2023-08-04 LAB — LACTIC ACID, PLASMA
Lactic Acid, Venous: 2.3 mmol/L (ref 0.5–1.9)
Lactic Acid, Venous: 2.2 mmol/L (ref 0.5–1.9)
Lactic Acid, Venous: 1.6 mmol/L (ref 0.5–1.9)

## 2023-08-04 LAB — COMPREHENSIVE METABOLIC PANEL
ALT: 18 U/L (ref 0–44)
AST: 26 U/L (ref 15–41)
Albumin: 3.4 g/dL — ABNORMAL LOW (ref 3.5–5.0)
Alkaline Phosphatase: 67 U/L (ref 38–126)
Anion gap: 11 (ref 5–15)
BUN: 16 mg/dL (ref 8–23)
CO2: 22 mmol/L (ref 22–32)
Calcium: 8.7 mg/dL — ABNORMAL LOW (ref 8.9–10.3)
Chloride: 87 mmol/L — ABNORMAL LOW (ref 98–111)
Creatinine, Ser: 0.49 mg/dL — ABNORMAL LOW (ref 0.61–1.24)
GFR, Estimated: >60 mL/min (ref >60)
Glucose, Bld: 367 mg/dL — ABNORMAL HIGH (ref 70–99)
Potassium: 4 mmol/L (ref 3.5–5.1)
Sodium: 120 mmol/L — ABNORMAL LOW (ref 135–145)
Total Bilirubin: 1.1 mg/dL (ref 0.0–1.2)
Total Protein: 6 g/dL — ABNORMAL LOW (ref 6.5–8.1)

## 2023-08-04 LAB — RESPIRATORY PANEL BY PCR

## 2023-08-04 LAB — GLUCOSE, CAPILLARY
Glucose-Capillary: 352 mg/dL — ABNORMAL HIGH (ref 70–99)
Glucose-Capillary: 293 mg/dL — ABNORMAL HIGH (ref 70–99)
Glucose-Capillary: 297 mg/dL — ABNORMAL HIGH (ref 70–99)
Glucose-Capillary: 209 mg/dL — ABNORMAL HIGH (ref 70–99)

## 2023-08-04 LAB — SODIUM
Sodium: 124 mmol/L — ABNORMAL LOW (ref 135–145)
Sodium: 123 mmol/L — ABNORMAL LOW (ref 135–145)

## 2023-08-04 LAB — MRSA NEXT GEN BY PCR, NASAL: MRSA by PCR Next Gen: NOT DETECTED

## 2023-08-04 LAB — PROCALCITONIN: Procalcitonin: 6.65 ng/mL

## 2023-08-04 LAB — STREP PNEUMONIAE URINARY ANTIGEN: Strep Pneumo Urinary Antigen: NEGATIVE

## 2023-08-04 LAB — PROTIME-INR
INR: 3.4 — ABNORMAL HIGH (ref 0.8–1.2)
Prothrombin Time: 34.7 s — ABNORMAL HIGH (ref 11.4–15.2)

## 2023-08-04 LAB — OSMOLALITY: Osmolality: 276 mosm/kg (ref 275–295)

## 2023-08-04 LAB — CBG MONITORING, ED: Glucose-Capillary: 298 mg/dL — ABNORMAL HIGH (ref 70–99)

## 2023-08-04 MED ORDER — SODIUM CHLORIDE 0.9 % IV SOLN
100.0000 mg | Freq: Two times a day (BID) | INTRAVENOUS | Status: DC
  Administered 2023-08-04 – 2023-08-06 (×5): 100 mg via INTRAVENOUS
  Filled 2023-08-04 (×7): qty 100

## 2023-08-04 MED ORDER — OXYCODONE-ACETAMINOPHEN 5-325 MG PO TABS
1.0000 | ORAL_TABLET | ORAL | Status: DC | PRN
  Administered 2023-08-04 – 2023-08-06 (×10): 1 via ORAL
  Filled 2023-08-04 (×11): qty 1

## 2023-08-04 MED ORDER — SODIUM CHLORIDE 0.9 % IV SOLN: INTRAVENOUS | Status: AC

## 2023-08-04 MED ORDER — CHLORHEXIDINE GLUCONATE CLOTH 2 % EX PADS
6.0000 | MEDICATED_PAD | Freq: Every day | CUTANEOUS | Status: DC
  Administered 2023-08-04 – 2023-08-20 (×16): 6 via TOPICAL

## 2023-08-04 MED ORDER — GUAIFENESIN ER 600 MG PO TB12
600.0000 mg | ORAL_TABLET | Freq: Two times a day (BID) | ORAL | Status: DC
  Administered 2023-08-04 – 2023-08-06 (×6): 600 mg via ORAL
  Filled 2023-08-04 (×6): qty 1

## 2023-08-04 MED ORDER — IPRATROPIUM-ALBUTEROL 0.5-2.5 (3) MG/3ML IN SOLN
3.0000 mL | Freq: Three times a day (TID) | RESPIRATORY_TRACT | Status: DC
  Administered 2023-08-04 – 2023-08-06 (×9): 3 mL via RESPIRATORY_TRACT
  Filled 2023-08-04 (×9): qty 3

## 2023-08-04 MED ORDER — ORAL CARE MOUTH RINSE: 15.0000 mL | OROMUCOSAL | Status: DC | PRN

## 2023-08-04 MED ORDER — LACTATED RINGERS IV BOLUS
1000.0000 mL | Freq: Once | INTRAVENOUS | Status: AC
  Administered 2023-08-04: 1000 mL via INTRAVENOUS

## 2023-08-04 NOTE — Inpatient Diabetes Management (Signed)
Inpatient Diabetes Program Recommendations  AACE/ADA: New Consensus Statement on Inpatient Glycemic Control (2015)  Target Ranges:  Prepandial:   less than 140 mg/dL      Peak postprandial:   less than 180 mg/dL (1-2 hours)      Critically ill patients:  140 - 180 mg/dL   Lab Results  Component Value Date   GLUCAP 352 (H) 08/04/2023   HGBA1C 8.8 (H) 05/19/2023    Review of Glycemic Control  Latest Reference Range & Units 08/03/23 21:43 08/04/23 08:07 08/04/23 08:59  Glucose-Capillary 70 - 99 mg/dL 981 (H) 191 (H) 478 (H)   Diabetes history: DM 2 Outpatient Diabetes medications: Lantus 26 units Daily, Humalog 3 units tid meal coverage Current orders for Inpatient glycemic control:  Semglee 26 units Daily Novolog 0-15 units tid + hs Novolog 3 units tid meal coverage  A1c 8.8% on 05/19/2023 Solumedrol 125 mg x1 dose on 1/16 now on 60 mg Q12  Inpatient Diabetes Program Recommendations:   While on steroids consider increasing insulin doses from home dose.  -   Semglee 35 units -   Novolog 6 units tid meal coverage if eating >50% of meals  Thanks,  Christena Deem RN, MSN, BC-ADM Inpatient Diabetes Coordinator Team Pager [phone] (8a-5p)

## 2023-08-04 NOTE — Progress Notes (Signed)
PHARMACY - ANTICOAGULATION CONSULT NOTE  Pharmacy Consult for warfarin Indication: Afib, PE, DVT  Allergies  Allergen Reactions   Daptomycin     Drug induced pneumonia    Diltiazem Other (See Comments)    Passed out   Ofev [Nintedanib] Diarrhea    fatigue   Pirfenidone Diarrhea    fatigue    Patient Measurements: Height: 5\' 9"  (175.3 cm) Weight: 90 kg (198 lb 6.6 oz) IBW/kg (Calculated) : 70.7    Vital Signs: Temp: 97.6 F (36.4 C) (01/17 1200) Temp Source: Axillary (01/17 1200) BP: 112/89 (01/17 1200) Pulse Rate: 58 (01/17 1200)  Labs: Recent Labs    08/03/23 1945 08/04/23 0346  HGB 13.Luis 11.9*  HCT 39.4 34.7*  PLT 346 338  LABPROT 34.0* 34.7*  INR 3.3* 3.4*  CREATININE 0.51* 0.49*    Estimated Creatinine Clearance: 83 mL/min (A) (by C-G formula based on SCr of 0.49 mg/dL (L)).   Assessment: 78 yo Brown on warfarin PTA for Afib, hx PE & DVT. Pharmacy consulted to manage warfarin while admitted for PNA  Baseline INR mildly supratherapeutic Prior anticoagulation: warfarin 5mg  daily except 7.5mg  Tues/Sat - last dose taken 1/16 AM  Significant events:  Today, 08/04/2023: CBC: Hgb slightly down this AM (likely dilutional); Plt stable WNL INR remains slightly supratherapeutic and stable despite holding warfarin last night Major drug interactions: broad spectrum abx for PNA can increase warfarin sensitivity via reduction in VitK-producing gut flora No bleeding issues per nursing Diet ordered, intake not charted  Goal of Therapy: INR Luis-3  Plan: Continue to hold warfarin today Daily INR CBC at least q72 hr while on warfarin Monitor for signs of bleeding or thrombosis   Bernadene Person, PharmD, BCPS [phone] 08/04/2023, 1:39 PM

## 2023-08-04 NOTE — Progress Notes (Signed)
Progress Note    Luis Brown   ZHY:[id number]  DOB: July 16, 1946  DOA: 08/03/2023     1 PCP: Bradd Canary, MD  Initial CC: SOB, cough  Hospital Course: [name] is a 78 yo male with PMH eosinophilic pneumonitis, ILD, pulmonary fibrosis, COPD, chronic hypoxia on 2 L O2 at home, PAF, history of PE and DVT on Coumadin, GERD, malnutrition, HLD, anxiety/depression, PMR who presented with cough, congestion, and worsening shortness of breath. He was tachycardic, tachypneic, and had leukocytosis on admission. CXR showed diffuse interstitial opacities.  Respiratory swabs were positive for RSV. He was started on steroids, nebs, and antibiotics.  Interval History:  Seen in stepdown after coming up from the ER.  Breathing was comfortable but he was on 5 L oxygen.  Assessment and Plan: Sepsis secondary to pneumonia CAP RSV infection - cough, dyspnea, congestion; tachycardia, tachypnea, leukocytosis; suspected respiratory infection -RSV swab positive -CXR likely showing underlying findings of ILD but difficult to rule out superimposed pneumonia in setting of worsened respiratory symptoms and leukocytosis noted -Check PCT but given extensive chronic lung disease history, would still be prudent to cover with antibiotics for now   COPD exacerbation History of pulmonary fibrosis -Patient has history of pulmonary fibrosis, COPD and interstitial lung disease.  History of intolerance to Pirfenidone and currently at home patient is on Trelegy once daily and albuterol as needed. - exac suspected in setting of RSV  - continue nebs and steroids   Euvolemic hyponatremia -Serum sodium 121.  Patient baseline sodium is around 128-132. - Hyponatremia likely in the setting of poor oral intake - continue IVF - repeat BMP in am   Paroxysmal atrial fibrillation - CHA2DS2-VASc score = 5, based on age, history of CHF and diabetes. -Continue warfarin pharmacy protocol -At home patient is not any rate  control medication as patient A-fib is rate controlled without any medications.   History of DVT and pulmonary embolism -Previously patient failed Xarelto and redeveloped DVT.  Currently patient is on Coumadin and aspirin.   Insulin dependent DM type II -A1c 8.8 checked in November 2024 - Continue same 26 unit daily, NovoLog 3 units 3 times daily with meals, moderate sliding scale and at bedtime insulin as needed coverage.   Hyperlipidemia -Not taking any lipid-lowering agent anymore  BPH -Continue Flomax 0.8 mg daily   Generalized anxiety disorder Insomnia -Continue Effexor 150 mg daily, bupropion 50 mg 3 times daily   Peripheral neuropathy - Continue Lyrica 75 mg twice daily and Zanaflex as needed   History of dCHF    Old records reviewed in assessment of this patient  Antimicrobials: Rocephin 1/16 >> current Doxy 1/16 >> current   DVT prophylaxis:  SCDs Start: 08/03/23 2118 Place TED hose Start: 08/03/23 2118   Code Status:   Code Status: Full Code  Mobility Assessment (Last 72 Hours)     Mobility Assessment   No documentation.           Barriers to discharge: none Disposition Plan:  Home HH orders placed: TBD Status is: Inpt  Objective: Blood pressure 112/89, pulse (!) 58, temperature 97.6 F (36.4 C), temperature source Axillary, resp. rate (!) 21, height 5\' 9"  (1.753 m), weight 90 kg, SpO2 99%.  Examination:  Physical Exam Constitutional:      General: He is not in acute distress.    Appearance: Normal appearance.  HENT:     Head: Normocephalic and atraumatic.     Mouth/Throat:     Mouth: Mucous  membranes are moist.  Eyes:     Extraocular Movements: Extraocular movements intact.  Cardiovascular:     Rate and Rhythm: Normal rate and regular rhythm.  Pulmonary:     Effort: Pulmonary effort is normal. No respiratory distress.     Breath sounds: Wheezing present.     Comments: Diffuse coarse breath sounds bilaterally  Abdominal:      General: Bowel sounds are normal. There is no distension.     Palpations: Abdomen is soft.     Tenderness: There is no abdominal tenderness.  Musculoskeletal:        General: Normal range of motion.     Cervical back: Normal range of motion and neck supple.  Skin:    General: Skin is warm and dry.  Neurological:     General: No focal deficit present.     Mental Status: He is alert.  Psychiatric:        Mood and Affect: Mood normal.        Behavior: Behavior normal.      Consultants:    Procedures:    Data Reviewed: Results for orders placed or performed during the hospital encounter of 08/03/23 (from the past 24 hours)  Basic metabolic panel     Status: Abnormal   Collection Time: 08/03/23  7:45 PM  Result Value Ref Range   Sodium 121 (L) 135 - 145 mmol/L   Potassium 4.1 3.5 - 5.1 mmol/L   Chloride 85 (L) 98 - 111 mmol/L   CO2 23 22 - 32 mmol/L   Glucose, Bld 327 (H) 70 - 99 mg/dL   BUN 17 8 - 23 mg/dL   Creatinine, Ser 9.56 (L) 0.61 - 1.24 mg/dL   Calcium 8.7 (L) 8.9 - 10.3 mg/dL   GFR, Estimated >38 >75 mL/min   Anion gap 13 5 - 15  CBC with Differential     Status: Abnormal   Collection Time: 08/03/23  7:45 PM  Result Value Ref Range   WBC 14.4 (H) 4.0 - 10.5 K/uL   RBC 4.61 4.22 - 5.81 MIL/uL   Hemoglobin 13.2 13.0 - 17.0 g/dL   HCT 64.3 32.9 - 51.8 %   MCV 85.5 80.0 - 100.0 fL   MCH 28.6 26.0 - 34.0 pg   MCHC 33.5 30.0 - 36.0 g/dL   RDW 84.1 66.0 - 63.0 %   Platelets 346 150 - 400 K/uL   nRBC 0.0 0.0 - 0.2 %   Neutrophils Relative % 94 %   Neutro Abs 13.4 (H) 1.7 - 7.7 K/uL   Lymphocytes Relative 0 %   Lymphs Abs 0.1 (L) 0.7 - 4.0 K/uL   Monocytes Relative 6 %   Monocytes Absolute 0.8 0.1 - 1.0 K/uL   Eosinophils Relative 0 %   Eosinophils Absolute 0.0 0.0 - 0.5 K/uL   Basophils Relative 0 %   Basophils Absolute 0.0 0.0 - 0.1 K/uL   Immature Granulocytes 0 %   Abs Immature Granulocytes 0.05 0.00 - 0.07 K/uL  Protime-INR     Status: Abnormal    Collection Time: 08/03/23  7:45 PM  Result Value Ref Range   Prothrombin Time 34.0 (H) 11.4 - 15.2 seconds   INR 3.3 (H) 0.8 - 1.2  Culture, blood (routine x 2)     Status: None (Preliminary result)   Collection Time: 08/03/23  9:00 PM   Specimen: BLOOD  Result Value Ref Range   Specimen Description      BLOOD BLOOD RIGHT ARM Performed  at [facility], [address], [place], [place] [address]    Special Requests      BOTTLES DRAWN AEROBIC AND ANAEROBIC Blood Culture results may not be optimal due to an inadequate volume of blood received in culture bottles Performed at [facility], [address] [place], [place] [address]    Culture      NO GROWTH < 12 HOURS Performed at [facility], [address] [place], [place] [address]    Report Status PENDING   Culture, blood (routine x 2)     Status: None (Preliminary result)   Collection Time: 08/03/23  9:10 PM   Specimen: BLOOD  Result Value Ref Range   Specimen Description      BLOOD RIGHT ANTECUBITAL Performed at [facility], [address] [place], [place] [address]    Special Requests      BOTTLES DRAWN AEROBIC AND ANAEROBIC Blood Culture results may not be optimal due to an inadequate volume of blood received in culture bottles Performed at [facility], [address] [place], [place] [address]    Culture      NO GROWTH < 12 HOURS Performed at [facility], [address], [place], [place] [address]    Report Status PENDING   CBG monitoring, ED     Status: Abnormal   Collection Time: 08/03/23  9:43 PM  Result Value Ref Range   Glucose-Capillary 373 (H) 70 - 99 mg/dL  Sodium     Status: Abnormal   Collection Time: 08/03/23 10:00 PM  Result Value Ref Range   Sodium 119 (LL) 135 - 145 mmol/L  Osmolality     Status: None   Collection Time: 08/03/23 10:00 PM  Result Value Ref Range   Osmolality 276 275 - 295 mOsm/kg  Sodium,  urine, random     Status: None   Collection Time: 08/03/23 10:00 PM  Result Value Ref Range   Sodium, Ur 18 mmol/L  Osmolality, urine     Status: None   Collection Time: 08/03/23 10:00 PM  Result Value Ref Range   Osmolality, Ur 601 300 - 900 mOsm/kg  TSH     Status: None   Collection Time: 08/03/23 10:00 PM  Result Value Ref Range   TSH 1.948 0.350 - 4.500 uIU/mL  Lactic acid, plasma     Status: Abnormal   Collection Time: 08/03/23 10:00 PM  Result Value Ref Range   Lactic Acid, Venous 2.1 (HH) 0.5 - 1.9 mmol/L  Respiratory (~20 pathogens) panel by PCR     Status: Abnormal   Collection Time: 08/04/23  1:48 AM   Specimen: Nasopharyngeal Swab; Respiratory  Result Value Ref Range   Adenovirus NOT DETECTED NOT DETECTED   Coronavirus 229E NOT DETECTED NOT DETECTED   Coronavirus HKU1 NOT DETECTED NOT DETECTED   Coronavirus NL63 NOT DETECTED NOT DETECTED   Coronavirus OC43 NOT DETECTED NOT DETECTED   Metapneumovirus NOT DETECTED NOT DETECTED   Rhinovirus / Enterovirus NOT DETECTED NOT DETECTED   Influenza A NOT DETECTED NOT DETECTED   Influenza B NOT DETECTED NOT DETECTED   Parainfluenza Virus 1 NOT DETECTED NOT DETECTED   Parainfluenza Virus 2 NOT DETECTED NOT DETECTED   Parainfluenza Virus 3 NOT DETECTED NOT DETECTED   Parainfluenza Virus 4 NOT DETECTED NOT DETECTED   Respiratory Syncytial Virus DETECTED (A) NOT DETECTED   Bordetella pertussis NOT DETECTED NOT DETECTED   Bordetella Parapertussis NOT DETECTED NOT  DETECTED   Chlamydophila pneumoniae NOT DETECTED NOT DETECTED   Mycoplasma pneumoniae NOT DETECTED NOT DETECTED  Comprehensive metabolic panel     Status: Abnormal   Collection Time: 08/04/23  3:46 AM  Result Value Ref Range   Sodium 120 (L) 135 - 145 mmol/L   Potassium 4.0 3.5 - 5.1 mmol/L   Chloride 87 (L) 98 - 111 mmol/L   CO2 22 22 - 32 mmol/L   Glucose, Bld 367 (H) 70 - 99 mg/dL   BUN 16 8 - 23 mg/dL   Creatinine, Ser 7.82 (L) 0.61 - 1.24 mg/dL   Calcium 8.7  (L) 8.9 - 10.3 mg/dL   Total Protein 6.0 (L) 6.5 - 8.1 g/dL   Albumin 3.4 (L) 3.5 - 5.0 g/dL   AST 26 15 - 41 U/L   ALT 18 0 - 44 U/L   Alkaline Phosphatase 67 38 - 126 U/L   Total Bilirubin 1.1 0.0 - 1.2 mg/dL   GFR, Estimated >95 >62 mL/min   Anion gap 11 5 - 15  CBC     Status: Abnormal   Collection Time: 08/04/23  3:46 AM  Result Value Ref Range   WBC 13.6 (H) 4.0 - 10.5 K/uL   RBC 4.02 (L) 4.22 - 5.81 MIL/uL   Hemoglobin 11.9 (L) 13.0 - 17.0 g/dL   HCT 13.0 (L) 86.5 - 78.4 %   MCV 86.3 80.0 - 100.0 fL   MCH 29.6 26.0 - 34.0 pg   MCHC 34.3 30.0 - 36.0 g/dL   RDW 69.6 29.5 - 28.4 %   Platelets 338 150 - 400 K/uL   nRBC 0.0 0.0 - 0.2 %  Protime-INR     Status: Abnormal   Collection Time: 08/04/23  3:46 AM  Result Value Ref Range   Prothrombin Time 34.7 (H) 11.4 - 15.2 seconds   INR 3.4 (H) 0.8 - 1.2  Lactic acid, plasma     Status: Abnormal   Collection Time: 08/04/23  3:46 AM  Result Value Ref Range   Lactic Acid, Venous 2.3 (HH) 0.5 - 1.9 mmol/L  Strep pneumoniae urinary antigen     Status: None   Collection Time: 08/04/23  4:08 AM  Result Value Ref Range   Strep Pneumo Urinary Antigen NEGATIVE NEGATIVE  Sodium     Status: Abnormal   Collection Time: 08/04/23  8:04 AM  Result Value Ref Range   Sodium 124 (L) 135 - 145 mmol/L  Lactic acid, plasma     Status: Abnormal   Collection Time: 08/04/23  8:04 AM  Result Value Ref Range   Lactic Acid, Venous 2.2 (HH) 0.5 - 1.9 mmol/L  CBG monitoring, ED     Status: Abnormal   Collection Time: 08/04/23  8:07 AM  Result Value Ref Range   Glucose-Capillary 298 (H) 70 - 99 mg/dL  MRSA Next Gen by PCR, Nasal     Status: None   Collection Time: 08/04/23  8:49 AM   Specimen: Nasal Mucosa; Nasal Swab  Result Value Ref Range   MRSA by PCR Next Gen NOT DETECTED NOT DETECTED  Glucose, capillary     Status: Abnormal   Collection Time: 08/04/23  8:59 AM  Result Value Ref Range   Glucose-Capillary 352 (H) 70 - 99 mg/dL   Comment 1  Notify RN    Comment 2 Document in Chart   Sodium     Status: Abnormal   Collection Time: 08/04/23 10:40 AM  Result Value Ref Range   Sodium  123 (L) 135 - 145 mmol/L  Lactic acid, plasma     Status: None   Collection Time: 08/04/23 10:40 AM  Result Value Ref Range   Lactic Acid, Venous 1.6 0.5 - 1.9 mmol/L  Glucose, capillary     Status: Abnormal   Collection Time: 08/04/23 12:08 PM  Result Value Ref Range   Glucose-Capillary 293 (H) 70 - 99 mg/dL   *Note: Due to a large number of results and/or encounters for the requested time period, some results have not been displayed. A complete set of results can be found in Results Review.    I have reviewed pertinent nursing notes, vitals, labs, and images as necessary. I have ordered labwork to follow up on as indicated.  I have reviewed the last notes from staff over past 24 hours. I have discussed patient's care plan and test results with nursing staff, CM/SW, and other staff as appropriate.  Time spent: Greater than 50% of the 55 minute visit was spent in counseling/coordination of care for the patient as laid out in the A&P.   LOS: 1 day   Lewie Chamber, MD [facility] 08/04/2023, 12:36 PM

## 2023-08-04 NOTE — ED Notes (Signed)
ED TO INPATIENT HANDOFF REPORT  Name/Age/Gender Luis Brown 78 y.o. male  Code Status    Code Status Orders  (From admission, onward)           Start     Ordered   08/03/23 2116  Full code  Continuous       Question:  By:  Answer:  Consent: discussion documented in EHR   08/03/23 2116           Code Status History     Date Active Date Inactive Code Status Order ID Comments User Context   01/27/2022 1321 01/27/2022 2216 Full Code [id number]  Regan Lemming, MD Inpatient   08/07/2021 2131 08/08/2021 2034 Full Code [id number]  Narda Bonds, MD Inpatient   07/16/2021 0437 07/24/2021 2214 Full Code [id number]  Therisa Doyne, MD ED   11/10/2020 2324 11/23/2020 2107 Full Code [id number]  Eduard Clos, MD Inpatient   09/14/2020 1030 09/15/2020 1838 Full Code [id number]  Kerrin Champagne, MD Inpatient   01/08/2020 0024 01/10/2020 1735 Full Code [id number]  Eduard Clos, MD Inpatient   03/12/2019 1445 03/14/2019 1435 Full Code [id number]  Kerrin Champagne, MD Inpatient   02/12/2019 1818 02/15/2019 2028 Full Code [id number]  Kerrin Champagne, MD Inpatient   05/23/2017 1822 05/25/2017 1511 Full Code [id number]  Kerrin Champagne, MD Inpatient       Home/SNF/Other Home  Chief Complaint Multifocal pneumonia [J18.9]  Level of Care/Admitting Diagnosis ED Disposition     ED Disposition  Admit   Condition  --   Comment  Hospital Area: [facility] [[id number]]  Level of Care: Stepdown [14]  Admit to SDU based on following criteria: Other see comments  Comments: Respiratory distress  May admit patient to [facility] or [facility] if equivalent level of care is available:: No  Covid Evaluation: Asymptomatic - no recent exposure (last 10 days) testing not required  Diagnosis: Multifocal pneumonia [3016010]  Admitting Physician: Tereasa Coop [[id number]]  Attending Physician: Tereasa Coop [[id number]]  Certification:: I certify this patient will need inpatient  services for at least 2 midnights  Expected Medical Readiness: 08/09/2023          Medical History 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   Esophageal reflux 02/10/2015   Fatigue    HTN (hypertension)    Hyperglycemia    preDM    Hyperlipidemia    Insomnia    Low back pain radiating to both legs 01/16/2017   Measles as a child   Overweight(278.02)    Peripheral neuropathy 08/07/2019   Personal history of colonic polyps 10/27/2012   Follows with [facility]   Pneumonia    " walking"   Preventative health care    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   Wears glasses     Allergies Allergies  Allergen Reactions   Daptomycin     Drug induced pneumonia    Diltiazem Other (See Comments)    Passed out  Ofev [Nintedanib] Diarrhea    fatigue   Pirfenidone Diarrhea    fatigue    IV Location/Drains/Wounds Patient Lines/Drains/Airways Status     Active Line/Drains/Airways     Name Placement date Placement time Site Days   Peripheral IV 08/03/23 20 G Right Antecubital 08/03/23  1830  Antecubital  1            Labs/Imaging Results for orders placed or performed during the hospital encounter of 08/03/23 (from the past 48 hours)  Basic metabolic panel     Status: Abnormal   Collection Time: 08/03/23  7:45 PM  Result Value Ref Range   Sodium 121 (L) 135 - 145 mmol/L   Potassium 4.1 3.5 - 5.1 mmol/L   Chloride 85 (L) 98 - 111 mmol/L   CO2 23 22 - 32 mmol/L   Glucose, Bld 327 (H) 70 - 99 mg/dL    Comment: Glucose  reference range applies only to samples taken after fasting for at least 8 hours.   BUN 17 8 - 23 mg/dL   Creatinine, Ser 6.06 (L) 0.61 - 1.24 mg/dL   Calcium 8.7 (L) 8.9 - 10.3 mg/dL   GFR, Estimated >30 >16 mL/min    Comment: (NOTE) Calculated using the CKD-EPI Creatinine Equation (2021)    Anion gap 13 5 - 15    Comment: Performed at [facility], [address], [place], [place] [address]  CBC with Differential     Status: Abnormal   Collection Time: 08/03/23  7:45 PM  Result Value Ref Range   WBC 14.4 (H) 4.0 - 10.5 K/uL   RBC 4.61 4.22 - 5.81 MIL/uL   Hemoglobin 13.2 13.0 - 17.0 g/dL   HCT 23.5 57.3 - 22.0 %   MCV 85.5 80.0 - 100.0 fL   MCH 28.6 26.0 - 34.0 pg   MCHC 33.5 30.0 - 36.0 g/dL   RDW 25.4 27.0 - 62.3 %   Platelets 346 150 - 400 K/uL   nRBC 0.0 0.0 - 0.2 %   Neutrophils Relative % 94 %   Neutro Abs 13.4 (H) 1.7 - 7.7 K/uL   Lymphocytes Relative 0 %   Lymphs Abs 0.1 (L) 0.7 - 4.0 K/uL   Monocytes Relative 6 %   Monocytes Absolute 0.8 0.1 - 1.0 K/uL   Eosinophils Relative 0 %   Eosinophils Absolute 0.0 0.0 - 0.5 K/uL   Basophils Relative 0 %   Basophils Absolute 0.0 0.0 - 0.1 K/uL   Immature Granulocytes 0 %   Abs Immature Granulocytes 0.05 0.00 - 0.07 K/uL    Comment: Performed at [facility], [address] [place], [place] [address]  Protime-INR     Status: Abnormal   Collection Time: 08/03/23  7:45 PM  Result Value Ref Range   Prothrombin Time 34.0 (H) 11.4 - 15.2 seconds   INR 3.3 (H) 0.8 - 1.2    Comment: (NOTE) INR goal varies based on device and disease states. Performed at [facility], [address], [place], [place] [address]   Culture, blood (routine x 2)     Status: None (Preliminary result)   Collection Time: 08/03/23  9:00 PM   Specimen: BLOOD  Result Value Ref Range   Specimen Description      BLOOD BLOOD RIGHT ARM Performed at [facility], [address], [place], [place] [address]    Special Requests      BOTTLES DRAWN AEROBIC AND ANAEROBIC Blood  Culture results may not be optimal due to an inadequate volume of blood received in culture bottles Performed at [facility], [address] [place], [place] [address]    Culture      NO GROWTH < 12 HOURS Performed at [facility], [address] [place], [place] [address]    Report Status PENDING   Culture, blood (routine x 2)     Status: None (Preliminary result)   Collection Time: 08/03/23  9:10 PM   Specimen: BLOOD  Result Value Ref Range   Specimen Description      BLOOD RIGHT ANTECUBITAL Performed at [facility], [address], [place], [place] [address]    Special Requests      BOTTLES DRAWN AEROBIC AND ANAEROBIC Blood Culture results may not be optimal due to an inadequate volume of blood received in culture bottles Performed at [facility], [address], [place], [place] [address]    Culture      NO GROWTH < 12 HOURS Performed at [facility], [address], [place], [place] [address]    Report Status PENDING   CBG monitoring, ED     Status: Abnormal   Collection Time: 08/03/23  9:43 PM  Result Value Ref Range   Glucose-Capillary 373 (H) 70 - 99 mg/dL    Comment: Glucose reference range applies only to samples taken after fasting for at least 8 hours.  Sodium     Status: Abnormal   Collection Time: 08/03/23 10:00 PM  Result Value Ref Range   Sodium 119 (LL) 135 - 145 mmol/L    Comment: CRITICAL RESULT CALLED TO, READ BACK BY AND VERIFIED WITH Melene Plan, RN 08/03/23 2230 BY K. DAVIS Performed at [facility], [address] [place], [place] [address]   Osmolality     Status: None   Collection Time: 08/03/23 10:00 PM  Result Value Ref Range   Osmolality 276 275 - 295 mOsm/kg    Comment: Performed at [facility], [address], [place], [place] [address]  Sodium,  urine, random     Status: None   Collection Time: 08/03/23 10:00 PM  Result Value Ref Range   Sodium, Ur 18 mmol/L    Comment: Performed at [facility], [address] [place], [place] [address]  Osmolality, urine     Status: None   Collection Time: 08/03/23 10:00 PM  Result Value Ref Range   Osmolality, Ur 601 300 - 900 mOsm/kg    Comment: Performed at [facility], [address], [place], [place] [address]  TSH     Status: None   Collection Time: 08/03/23 10:00 PM  Result Value Ref Range   TSH 1.948 0.350 - 4.500 uIU/mL    Comment: Performed by a 3rd Generation assay with a functional sensitivity of <=0.01 uIU/mL. Performed at [facility], [address] [place], [place] [address]   Lactic acid, plasma     Status: Abnormal   Collection Time: 08/03/23 10:00 PM  Result Value Ref Range   Lactic Acid, Venous 2.1 (HH) 0.5 - 1.9 mmol/L    Comment: CRITICAL RESULT CALLED TO, READ BACK BY AND VERIFIED WITH Dory Horn, RN 07/24/23 2235 BY K. DAVIS Performed at [facility], [address] [place], [place] [address]   Respiratory (~20 pathogens) panel by PCR     Status: Abnormal   Collection Time:  08/04/23  1:48 AM   Specimen: Nasopharyngeal Swab; Respiratory  Result Value Ref Range   Adenovirus NOT DETECTED NOT DETECTED   Coronavirus 229E NOT DETECTED NOT DETECTED    Comment: (NOTE) The Coronavirus on the Respiratory Panel, DOES NOT test for the novel  Coronavirus (2019 nCoV)    Coronavirus HKU1 NOT DETECTED NOT DETECTED   Coronavirus NL63 NOT DETECTED NOT DETECTED   Coronavirus OC43 NOT DETECTED NOT DETECTED   Metapneumovirus NOT DETECTED NOT DETECTED   Rhinovirus / Enterovirus NOT DETECTED NOT DETECTED   Influenza A NOT DETECTED NOT DETECTED   Influenza B NOT DETECTED NOT DETECTED   Parainfluenza Virus 1 NOT DETECTED NOT DETECTED   Parainfluenza Virus 2 NOT DETECTED NOT DETECTED   Parainfluenza Virus 3 NOT  DETECTED NOT DETECTED   Parainfluenza Virus 4 NOT DETECTED NOT DETECTED   Respiratory Syncytial Virus DETECTED (A) NOT DETECTED   Bordetella pertussis NOT DETECTED NOT DETECTED   Bordetella Parapertussis NOT DETECTED NOT DETECTED   Chlamydophila pneumoniae NOT DETECTED NOT DETECTED   Mycoplasma pneumoniae NOT DETECTED NOT DETECTED    Comment: Performed at [facility], [address], [place], [place] [address]  Comprehensive metabolic panel     Status: Abnormal   Collection Time: 08/04/23  3:46 AM  Result Value Ref Range   Sodium 120 (L) 135 - 145 mmol/L   Potassium 4.0 3.5 - 5.1 mmol/L   Chloride 87 (L) 98 - 111 mmol/L   CO2 22 22 - 32 mmol/L   Glucose, Bld 367 (H) 70 - 99 mg/dL    Comment: Glucose reference range applies only to samples taken after fasting for at least 8 hours.   BUN 16 8 - 23 mg/dL   Creatinine, Ser 4.74 (L) 0.61 - 1.24 mg/dL   Calcium 8.7 (L) 8.9 - 10.3 mg/dL   Total Protein 6.0 (L) 6.5 - 8.1 g/dL   Albumin 3.4 (L) 3.5 - 5.0 g/dL   AST 26 15 - 41 U/L   ALT 18 0 - 44 U/L   Alkaline Phosphatase 67 38 - 126 U/L   Total Bilirubin 1.1 0.0 - 1.2 mg/dL   GFR, Estimated >25 >95 mL/min    Comment: (NOTE) Calculated using the CKD-EPI Creatinine Equation (2021)    Anion gap 11 5 - 15    Comment: Performed at [facility], [address], [place], [place] [address]  CBC     Status: Abnormal   Collection Time: 08/04/23  3:46 AM  Result Value Ref Range   WBC 13.6 (H) 4.0 - 10.5 K/uL   RBC 4.02 (L) 4.22 - 5.81 MIL/uL   Hemoglobin 11.9 (L) 13.0 - 17.0 g/dL   HCT 64.3 (L) 32.9 - 51.8 %   MCV 86.3 80.0 - 100.0 fL   MCH 29.6 26.0 - 34.0 pg   MCHC 34.3 30.0 - 36.0 g/dL   RDW 84.1 66.0 - 63.0 %   Platelets 338 150 - 400 K/uL   nRBC 0.0 0.0 - 0.2 %    Comment: Performed at [facility], [address], [place], [place] [address]  Protime-INR     Status: Abnormal   Collection Time: 08/04/23  3:46 AM  Result Value Ref Range    Prothrombin Time 34.7 (H) 11.4 - 15.2 seconds   INR 3.4 (H) 0.8 - 1.2    Comment: (NOTE) INR goal varies based on device and disease states. Performed at [facility], [address]  [place], [place] [address]   Lactic acid, plasma     Status: Abnormal   Collection Time: 08/04/23  3:46 AM  Result Value Ref Range   Lactic Acid, Venous 2.3 (HH) 0.5 - 1.9 mmol/L    Comment: CRITICAL VALUE NOTED. VALUE IS CONSISTENT WITH PREVIOUSLY REPORTED/CALLED VALUE Performed at [facility], [address], [place], [place] [address]   Strep pneumoniae urinary antigen     Status: None   Collection Time: 08/04/23  4:08 AM  Result Value Ref Range   Strep Pneumo Urinary Antigen NEGATIVE NEGATIVE    Comment:        Infection due to S. pneumoniae cannot be absolutely ruled out since the antigen present may be below the detection limit of the test. Performed at [facility], [address] [place], [place] [address]    *Note: Due to a large number of results and/or encounters for the requested time period, some results have not been displayed. A complete set of results can be found in Results Review.   DG Chest Portable 1 View Result Date: 08/03/2023 CLINICAL DATA:  Shortness of breath EXAM: PORTABLE CHEST 1 VIEW COMPARISON:  08/02/2023 FINDINGS: Multifocal patchy opacities, right upper lobe and left lower lobe predominant, new. This appearance suggests multifocal pneumonia. No pleural effusion or pneumothorax. The heart is normal in size. Lumbar spine fixation hardware with fractured spinal fixation rods, unchanged. IMPRESSION: Multifocal pneumonia, new. Electronically Signed   By: Charline Bills M.D.   On: 08/03/2023 20:25   DG Chest 2 View Result Date: 08/02/2023 CLINICAL DATA:  Cough, shortness of breath EXAM: CHEST - 2 VIEW COMPARISON:  05/19/2023 FINDINGS: The heart size and mediastinal contours are within normal limits. Chronically coarsened  interstitial markings bilaterally. No focal airspace consolidation, pleural effusion, or pneumothorax. Thoracolumbar fusion with chronically fractured rods bilaterally. Abandoned screw fragments present above the fused levels, unchanged. IMPRESSION: Chronically coarsened interstitial markings bilaterally. No superimposed acute cardiopulmonary findings. Electronically Signed   By: Duanne Guess D.O.   On: 08/02/2023 13:36    Pending Labs Unresulted Labs (From admission, onward)     Start     Ordered   08/05/23 0500  CBC  Tomorrow morning,   R        08/04/23 0655   08/05/23 0500  Comprehensive metabolic panel  Tomorrow morning,   R        08/04/23 0655   08/04/23 0800  Lactic acid, plasma  (Lactic Acid)  STAT Now then every 3 hours,   R (with STAT occurrences)      08/04/23 0513   08/04/23 0507  Expectorated Sputum Assessment w Gram Stain, Rflx to Resp Cult  Once,   R       Question:  Patient immune status  Answer:  Immunocompromised   08/04/23 0506   08/04/23 0500  Protime-INR  Daily,   R      08/03/23 2132   08/04/23 0400  Sodium  Now then every 6 hours,   R (with TIMED occurrences)      08/04/23 0056   08/04/23 0049  Legionella Pneumophila Serogp 1 Ur Ag  Once,   R        08/04/23 0048            Vitals/Pain Today's Vitals   08/04/23 0535 08/04/23 0730 08/04/23 0732 08/04/23 0734  BP:  (!) 141/69    Pulse:  92    Resp:  (!) 24    Temp:  97.8 F (36.6 C)  TempSrc:    Oral  SpO2:  96%    Weight:      Height:      PainSc: 0-No pain  0-No pain     Isolation Precautions Droplet precaution  Medications Medications  busPIRone (BUSPAR) tablet 15 mg (15 mg Oral Given 08/03/23 2251)  venlafaxine XR (EFFEXOR-XR) 24 hr capsule 150 mg (has no administration in time range)  tamsulosin (FLOMAX) capsule 0.8 mg (has no administration in time range)  pregabalin (LYRICA) capsule 75 mg (75 mg Oral Given 08/03/23 2234)  tiZANidine (ZANAFLEX) tablet 6 mg (6 mg Oral Given 08/04/23  0357)  methylPREDNISolone sodium succinate (SOLU-MEDROL) 125 mg/2 mL injection 60 mg (has no administration in time range)  cefTRIAXone (ROCEPHIN) 2 g in sodium chloride 0.9 % 100 mL IVPB (has no administration in time range)  aspirin EC tablet 81 mg (81 mg Oral Given 08/03/23 2203)  acetaminophen (TYLENOL) tablet 650 mg (has no administration in time range)    Or  acetaminophen (TYLENOL) suppository 650 mg (has no administration in time range)  senna-docusate (Senokot-S) tablet 1 tablet (has no administration in time range)  ondansetron (ZOFRAN) tablet 4 mg (has no administration in time range)    Or  ondansetron (ZOFRAN) injection 4 mg (has no administration in time range)  labetalol (NORMODYNE) injection 10 mg (has no administration in time range)  insulin glargine-yfgn (SEMGLEE) injection 26 Units (26 Units Subcutaneous Given 08/03/23 2337)  insulin aspart (novoLOG) injection 0-15 Units ( Subcutaneous Not Given 08/03/23 2150)  insulin aspart (novoLOG) injection 0-5 Units (5 Units Subcutaneous Given 08/03/23 2202)  albuterol (PROVENTIL) (2.5 MG/3ML) 0.083% nebulizer solution 2.5 mg (has no administration in time range)  doxycycline (VIBRAMYCIN) 100 mg in dextrose 5 % 250 mL IVPB (0 mg Intravenous Stopped 08/04/23 0144)  Warfarin - Pharmacist Dosing Inpatient (has no administration in time range)  insulin aspart (novoLOG) injection 3 Units (has no administration in time range)  pantoprazole (PROTONIX) EC tablet 40 mg (40 mg Oral Given 08/03/23 2337)  ipratropium-albuterol (DUONEB) 0.5-2.5 (3) MG/3ML nebulizer solution 3 mL (has no administration in time range)  guaiFENesin (MUCINEX) 12 hr tablet 600 mg (600 mg Oral Given 08/04/23 0146)  0.9 %  sodium chloride infusion ( Intravenous New Bag/Given 08/04/23 0146)  methylPREDNISolone sodium succinate (SOLU-MEDROL) 125 mg/2 mL injection 125 mg (125 mg Intravenous Given 08/03/23 1941)  magnesium sulfate IVPB 2 g 50 mL (0 g Intravenous Stopped 08/03/23  1959)  albuterol (PROVENTIL) (2.5 MG/3ML) 0.083% nebulizer solution 5 mg (5 mg Nebulization Given 08/03/23 1930)  ipratropium (ATROVENT) nebulizer solution 0.5 mg (0.5 mg Nebulization Given 08/03/23 1930)  cefTRIAXone (ROCEPHIN) 1 g in sodium chloride 0.9 % 100 mL IVPB (0 g Intravenous Stopped 08/03/23 2141)  azithromycin (ZITHROMAX) 500 mg in sodium chloride 0.9 % 250 mL IVPB (0 mg Intravenous Stopped 08/03/23 2239)  lactated ringers bolus 1,000 mL (0 mLs Intravenous Stopped 08/04/23 0144)  lactated ringers bolus 1,000 mL (1,000 mLs Intravenous New Bag/Given 08/04/23 0535)    Mobility walks with person assist

## 2023-08-04 NOTE — Care Management (Signed)
Transition of Care [facility]) - Inpatient Brief Assessment   Patient Details  Name: Luis Brown MRN: [id number] Date of Birth: [birth year]  Transition of Care [facility]) CM/SW Contact:    Lavenia Atlas, RN Phone Number: 08/04/2023, 7:12 PM   Clinical Narrative: Per chart review patient currently in [facility] SDU for multifocal pneumonia. Patient currently on 5LNC O2, prior to admission baseline of 2LNC oxygen with [employer].   TOC following for discharge needs   Transition of Care Asessment: Insurance and Status: Insurance coverage has been reviewed Patient has primary care physician: Yes Home environment has been reviewed: from home with spouse Prior level of function:: independent Prior/Current Home Services: No current home services Social Drivers of Health Review: SDOH reviewed no interventions necessary Readmission risk has been reviewed: Yes Transition of care needs: no transition of care needs at this time

## 2023-08-04 NOTE — Hospital Course (Signed)
Mr. 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 He was tachycardic, tachypneic, and had leukocytosis on admission. CXR showed diffuse interstitial opacities.  Respiratory swabs were positive for RSV. He was started on steroids, nebs, and antibiotics.

## 2023-08-04 NOTE — Plan of Care (Signed)
  Problem: Education: Goal: Ability to describe self-care measures that may prevent or decrease complications (Diabetes Survival Skills Education) will improve Outcome: Progressing Goal: Individualized Educational Video(s) Outcome: Progressing   Problem: Fluid Volume: Goal: Ability to maintain a balanced intake and output will improve Outcome: Progressing   Problem: Nutritional: Goal: Maintenance of adequate nutrition will improve Outcome: Progressing Goal: Progress toward achieving an optimal weight will improve Outcome: Progressing

## 2023-08-04 NOTE — ED Notes (Signed)
Waiting for breakfast tray to give insulin

## 2023-08-05 DIAGNOSIS — J441 Chronic obstructive pulmonary disease with (acute) exacerbation: Secondary | ICD-10-CM | POA: Diagnosis not present

## 2023-08-05 DIAGNOSIS — I480 Paroxysmal atrial fibrillation: Secondary | ICD-10-CM | POA: Diagnosis not present

## 2023-08-05 DIAGNOSIS — J189 Pneumonia, unspecified organism: Secondary | ICD-10-CM | POA: Diagnosis not present

## 2023-08-05 DIAGNOSIS — E871 Hypo-osmolality and hyponatremia: Secondary | ICD-10-CM | POA: Diagnosis not present

## 2023-08-05 LAB — COMPREHENSIVE METABOLIC PANEL
ALT: 14 U/L (ref 0–44)
AST: 22 U/L (ref 15–41)
Albumin: 2.8 g/dL — ABNORMAL LOW (ref 3.5–5.0)
Alkaline Phosphatase: 56 U/L (ref 38–126)
Anion gap: 7 (ref 5–15)
BUN: 16 mg/dL (ref 8–23)
CO2: 22 mmol/L (ref 22–32)
Calcium: 8.4 mg/dL — ABNORMAL LOW (ref 8.9–10.3)
Chloride: 100 mmol/L (ref 98–111)
Creatinine, Ser: 0.42 mg/dL — ABNORMAL LOW (ref 0.61–1.24)
GFR, Estimated: >60 mL/min (ref >60)
Glucose, Bld: 147 mg/dL — ABNORMAL HIGH (ref 70–99)
Potassium: 3.8 mmol/L (ref 3.5–5.1)
Sodium: 129 mmol/L — ABNORMAL LOW (ref 135–145)
Total Bilirubin: 0.5 mg/dL (ref 0.0–1.2)
Total Protein: 5.3 g/dL — ABNORMAL LOW (ref 6.5–8.1)

## 2023-08-05 LAB — CBC WITH DIFFERENTIAL/PLATELET
Abs Immature Granulocytes: 0.05 10*3/uL (ref 0.00–0.07)
Basophils Absolute: 0 10*3/uL (ref 0.0–0.1)
Basophils Relative: 0 %
Eosinophils Absolute: 0 10*3/uL (ref 0.0–0.5)
Eosinophils Relative: 0 %
HCT: 32.4 % — ABNORMAL LOW (ref 39.0–52.0)
Hemoglobin: 10.3 g/dL — ABNORMAL LOW (ref 13.0–17.0)
Immature Granulocytes: 1 %
Lymphocytes Relative: 1 %
Lymphs Abs: 0.1 10*3/uL — ABNORMAL LOW (ref 0.7–4.0)
MCH: 28.1 pg (ref 26.0–34.0)
MCHC: 31.8 g/dL (ref 30.0–36.0)
MCV: 88.3 fL (ref 80.0–100.0)
Monocytes Absolute: 0.8 10*3/uL (ref 0.1–1.0)
Monocytes Relative: 7 %
Neutro Abs: 9.9 10*3/uL — ABNORMAL HIGH (ref 1.7–7.7)
Neutrophils Relative %: 91 %
Platelets: 331 10*3/uL (ref 150–400)
RBC: 3.67 MIL/uL — ABNORMAL LOW (ref 4.22–5.81)
RDW: 14.6 % (ref 11.5–15.5)
WBC: 10.9 10*3/uL — ABNORMAL HIGH (ref 4.0–10.5)
nRBC: 0 % (ref 0.0–0.2)

## 2023-08-05 LAB — EXPECTORATED SPUTUM ASSESSMENT W GRAM STAIN, RFLX TO RESP C

## 2023-08-05 LAB — GLUCOSE, CAPILLARY
Glucose-Capillary: 112 mg/dL — ABNORMAL HIGH (ref 70–99)
Glucose-Capillary: 215 mg/dL — ABNORMAL HIGH (ref 70–99)
Glucose-Capillary: 267 mg/dL — ABNORMAL HIGH (ref 70–99)
Glucose-Capillary: 242 mg/dL — ABNORMAL HIGH (ref 70–99)

## 2023-08-05 LAB — MAGNESIUM: Magnesium: 2 mg/dL (ref 1.7–2.4)

## 2023-08-05 LAB — PROTIME-INR
INR: 2.6 — ABNORMAL HIGH (ref 0.8–1.2)
Prothrombin Time: 28.2 s — ABNORMAL HIGH (ref 11.4–15.2)

## 2023-08-05 MED ORDER — MORPHINE SULFATE (PF) 2 MG/ML IV SOLN: 2.0000 mg | INTRAVENOUS | Status: DC | PRN

## 2023-08-05 MED ORDER — PHENOL 1.4 % MT LIQD
1.0000 | OROMUCOSAL | Status: DC | PRN
  Administered 2023-08-05 – 2023-08-06 (×3): 1 via OROMUCOSAL
  Filled 2023-08-05: qty 177

## 2023-08-05 MED ORDER — WARFARIN SODIUM 5 MG PO TABS
5.0000 mg | ORAL_TABLET | Freq: Once | ORAL | Status: AC
Start: 2023-08-05 — End: 2023-08-05
  Administered 2023-08-05: 5 mg via ORAL
  Filled 2023-08-05: qty 1

## 2023-08-05 MED ORDER — HYDROMORPHONE HCL 1 MG/ML IJ SOLN
0.5000 mg | INTRAMUSCULAR | Status: DC | PRN
  Administered 2023-08-05 – 2023-08-06 (×3): 0.5 mg via INTRAVENOUS
  Filled 2023-08-05 (×3): qty 1

## 2023-08-05 MED ORDER — LORAZEPAM 2 MG/ML IJ SOLN
1.0000 mg | INTRAMUSCULAR | Status: DC | PRN
  Administered 2023-08-06 (×3): 1 mg via INTRAVENOUS
  Filled 2023-08-05 (×4): qty 1

## 2023-08-05 MED ORDER — MENTHOL 3 MG MT LOZG
1.0000 | LOZENGE | OROMUCOSAL | Status: DC | PRN
  Administered 2023-08-05 – 2023-08-06 (×2): 3 mg via ORAL
  Filled 2023-08-05: qty 9

## 2023-08-05 NOTE — Progress Notes (Signed)
PHARMACY - ANTICOAGULATION CONSULT NOTE  Pharmacy Consult for warfarin Indication: Afib, PE, DVT  Allergies  Allergen Reactions   Daptomycin     Drug induced pneumonia    Diltiazem Other (See Comments)    Passed out   Ofev [Nintedanib] Diarrhea    fatigue   Pirfenidone Diarrhea    fatigue    Patient Measurements: Height: 5\' 9"  (175.3 cm) Weight: 90 kg (198 lb 6.6 oz) IBW/kg (Calculated) : 70.7    Vital Signs: Temp: 98.6 F (37 C) (01/18 1200) Temp Source: Oral (01/18 1200) BP: 128/68 (01/18 0600) Pulse Rate: 78 (01/18 0600)  Labs: Recent Labs    08/03/23 1945 08/04/23 0346 08/05/23 0237  HGB 13.2 11.9* 10.3*  HCT 39.4 34.7* 32.4*  PLT 346 338 331  LABPROT 34.0* 34.7* 28.2*  INR 3.3* 3.4* 2.6*  CREATININE 0.51* 0.49* 0.42*    Estimated Creatinine Clearance: 83 mL/min (A) (by C-G formula based on SCr of 0.42 mg/dL (L)).   Assessment: 78 yo M on warfarin PTA for Afib, hx PE & DVT. Pharmacy consulted to manage warfarin while admitted for PNA  Baseline INR mildly supratherapeutic Prior anticoagulation: warfarin 5mg  daily except 7.5mg  Tues/Sat - last dose taken 1/16 AM  Significant events:  Today, 08/05/2023: CBC: Hgb slightly down again this AM (likely d/t hemodilution and acute illness); Plt stable WNL INR now decreased to therapeutic range after holding warfarin yesterday Major drug interactions: broad spectrum abx for PNA can increase warfarin sensitivity No bleeding issues per nursing Diet ordered, intake not charted  Goal of Therapy: INR 2-3  Plan: Warfarin 5 mg today Daily INR CBC at least q72 hr while on warfarin Monitor for signs of bleeding or thrombosis   Bernadene Person, PharmD, BCPS [phone] 08/05/2023, 1:47 PM

## 2023-08-05 NOTE — Plan of Care (Signed)
  Problem: Nutritional: Goal: Maintenance of adequate nutrition will improve Outcome: Progressing   Problem: Education: Goal: Knowledge of General Education information will improve Description: Including pain rating scale, medication(s)/side effects and non-pharmacologic comfort measures Outcome: Progressing

## 2023-08-05 NOTE — Progress Notes (Signed)
Progress Note    Luis Brown   [id number]:[id number]  DOB: 1946/04/29  DOA: 08/03/2023     2 PCP: Luis Canary, MD  Initial CC: SOB, cough  Hospital Course: Luis Brown is a 78 yo male with PMH eosinophilic pneumonitis, ILD, pulmonary fibrosis, COPD, chronic hypoxia on 2 L O2 at home, PAF, history of PE and DVT on Luis Brown, GERD, malnutrition, HLD, anxiety/depression, PMR who presented with cough, congestion, and worsening shortness of breath. He was tachycardic, tachypneic, and had leukocytosis on admission. CXR showed diffuse interstitial opacities.  Respiratory swabs were positive for RSV. He was started on steroids, nebs, and antibiotics.  Interval History:  Oxygen saturations adequate but he is having increased work of breathing which makes him feel panicked and anxious.  Adding morphine and Ativan this morning.  Wife present bedside this morning.  Assessment and Plan: Sepsis secondary to pneumonia CAP RSV infection - cough, dyspnea, congestion; tachycardia, tachypnea, leukocytosis; suspected respiratory infection -RSV swab positive -CXR likely showing underlying findings of ILD but difficult to rule out superimposed pneumonia in setting of worsened respiratory symptoms and leukocytosis noted -PCT elevated.  Continue antibiotics   COPD exacerbation History of pulmonary fibrosis -Patient has history of pulmonary fibrosis, COPD and interstitial lung disease.  History of intolerance to Pirfenidone and currently at home patient is on Luis Brown once daily and albuterol as needed. - exac suspected in setting of RSV  - continue nebs and steroids   Euvolemic hyponatremia -Serum sodium 121.  Patient baseline sodium is around 128-132. - Hyponatremia likely in the setting of poor oral intake - s/p IVF - now improved - trend BMP   Paroxysmal atrial fibrillation - CHA2DS2-VASc score = 5, based on age, history of CHF and diabetes. -Continue warfarin pharmacy protocol -At home patient  is not any rate control medication as patient A-fib is rate controlled without any medications.   History of DVT and pulmonary embolism -Previously patient failed Xarelto and redeveloped DVT.  Currently patient is on Luis Brown and aspirin.   Insulin dependent DM type II -A1c 8.8 checked in November 2024 - continue SSI and CBGs   Hyperlipidemia -Not on statin   BPH -Continue Luis Brown 0.8 mg daily   Generalized anxiety disorder Insomnia -Continue Effexor 150 mg daily, bupropion 50 mg 3 times daily   Peripheral neuropathy - Continue Luis Brown 75 mg twice daily and Luis Brown as needed   History of dCHF    Old records reviewed in assessment of this patient  Antimicrobials: Rocephin 1/16 >> current Doxy 1/16 >> current   DVT prophylaxis:  SCDs Start: 08/03/23 2118 Place TED hose Start: 08/03/23 2118   Code Status:   Code Status: Full Code  Mobility Assessment (Last 72 Hours)     Mobility Assessment   No documentation.           Barriers to discharge: none Disposition Plan:  Home HH orders placed: TBD Status is: Inpt  Objective: Blood pressure 128/68, pulse 78, temperature 98.6 F (37 C), temperature source Oral, resp. rate (!) 26, height 5\' 9"  (1.753 m), weight 90 kg, SpO2 93%.  Examination:  Physical Exam Constitutional:      General: He is not in acute distress.    Appearance: Normal appearance.  HENT:     Head: Normocephalic and atraumatic.     Mouth/Throat:     Mouth: Mucous membranes are moist.  Eyes:     Extraocular Movements: Extraocular movements intact.  Cardiovascular:     Rate and Rhythm: Normal  rate and regular rhythm.  Pulmonary:     Effort: Pulmonary effort is normal. No respiratory distress.     Breath sounds: Wheezing present.     Comments: Diffuse coarse breath sounds bilaterally  Abdominal:     General: Bowel sounds are normal. There is no distension.     Palpations: Abdomen is soft.     Tenderness: There is no abdominal tenderness.   Musculoskeletal:        General: Normal range of motion.     Cervical back: Normal range of motion and neck supple.  Skin:    General: Skin is warm and dry.  Neurological:     General: No focal deficit present.     Mental Status: He is alert.  Psychiatric:        Mood and Affect: Mood normal.        Behavior: Behavior normal.      Consultants:    Procedures:    Data Reviewed: Results for orders placed or performed during the hospital encounter of 08/03/23 (from the past 24 hours)  Glucose, capillary     Status: Abnormal   Collection Time: 08/04/23  4:04 PM  Result Value Ref Range   Glucose-Capillary 297 (H) 70 - 99 mg/dL   Comment 1 Notify RN    Comment 2 Document in Chart   Glucose, capillary     Status: Abnormal   Collection Time: 08/04/23  9:43 PM  Result Value Ref Range   Glucose-Capillary 209 (H) 70 - 99 mg/dL  Protime-INR     Status: Abnormal   Collection Time: 08/05/23  2:37 AM  Result Value Ref Range   Prothrombin Time 28.2 (H) 11.4 - 15.2 seconds   INR 2.6 (H) 0.8 - 1.2  Comprehensive metabolic panel     Status: Abnormal   Collection Time: 08/05/23  2:37 AM  Result Value Ref Range   Sodium 129 (L) 135 - 145 mmol/L   Potassium 3.8 3.5 - 5.1 mmol/L   Chloride 100 98 - 111 mmol/L   CO2 22 22 - 32 mmol/L   Glucose, Bld 147 (H) 70 - 99 mg/dL   BUN 16 8 - 23 mg/dL   Creatinine, Ser 8.11 (L) 0.61 - 1.24 mg/dL   Calcium 8.4 (L) 8.9 - 10.3 mg/dL   Total Protein 5.3 (L) 6.5 - 8.1 g/dL   Albumin 2.8 (L) 3.5 - 5.0 g/dL   AST 22 15 - 41 U/L   ALT 14 0 - 44 U/L   Alkaline Phosphatase 56 38 - 126 U/L   Total Bilirubin 0.5 0.0 - 1.2 mg/dL   GFR, Estimated >91 >47 mL/min   Anion gap 7 5 - 15  Magnesium     Status: None   Collection Time: 08/05/23  2:37 AM  Result Value Ref Range   Magnesium 2.0 1.7 - 2.4 mg/dL  CBC with Differential/Platelet     Status: Abnormal   Collection Time: 08/05/23  2:37 AM  Result Value Ref Range   WBC 10.9 (H) 4.0 - 10.5 K/uL   RBC  3.67 (L) 4.22 - 5.81 MIL/uL   Hemoglobin 10.3 (L) 13.0 - 17.0 g/dL   HCT 82.9 (L) 56.2 - 13.0 %   MCV 88.3 80.0 - 100.0 fL   MCH 28.1 26.0 - 34.0 pg   MCHC 31.8 30.0 - 36.0 g/dL   RDW 86.5 78.4 - 69.6 %   Platelets 331 150 - 400 K/uL   nRBC 0.0 0.0 - 0.2 %  Neutrophils Relative % 91 %   Neutro Abs 9.9 (H) 1.7 - 7.7 K/uL   Lymphocytes Relative 1 %   Lymphs Abs 0.1 (L) 0.7 - 4.0 K/uL   Monocytes Relative 7 %   Monocytes Absolute 0.8 0.1 - 1.0 K/uL   Eosinophils Relative 0 %   Eosinophils Absolute 0.0 0.0 - 0.5 K/uL   Basophils Relative 0 %   Basophils Absolute 0.0 0.0 - 0.1 K/uL   Immature Granulocytes 1 %   Abs Immature Granulocytes 0.05 0.00 - 0.07 K/uL  Glucose, capillary     Status: Abnormal   Collection Time: 08/05/23  7:47 AM  Result Value Ref Range   Glucose-Capillary 112 (H) 70 - 99 mg/dL   Comment 1 Notify RN    Comment 2 Document in Chart   Glucose, capillary     Status: Abnormal   Collection Time: 08/05/23 12:27 PM  Result Value Ref Range   Glucose-Capillary 215 (H) 70 - 99 mg/dL   Comment 1 Notify RN    Comment 2 Document in Chart    *Note: Due to a large number of results and/or encounters for the requested time period, some results have not been displayed. A complete set of results can be found in Results Review.    I have reviewed pertinent nursing notes, vitals, labs, and images as necessary. I have ordered labwork to follow up on as indicated.  I have reviewed the last notes from staff over past 24 hours. I have discussed patient's care plan and test results with nursing staff, CM/SW, and other staff as appropriate.  Time spent: Greater than 50% of the 55 minute visit was spent in counseling/coordination of care for the patient as laid out in the A&P.   LOS: 2 days   Lewie Chamber, MD [facility] 08/05/2023, 1:07 PM

## 2023-08-06 ENCOUNTER — Other Ambulatory Visit: Payer: Self-pay

## 2023-08-06 DIAGNOSIS — R000 Tachycardia, unspecified: Secondary | ICD-10-CM | POA: Diagnosis not present

## 2023-08-06 DIAGNOSIS — J441 Chronic obstructive pulmonary disease with (acute) exacerbation: Secondary | ICD-10-CM | POA: Diagnosis not present

## 2023-08-06 DIAGNOSIS — I480 Paroxysmal atrial fibrillation: Secondary | ICD-10-CM | POA: Diagnosis not present

## 2023-08-06 DIAGNOSIS — J189 Pneumonia, unspecified organism: Secondary | ICD-10-CM | POA: Diagnosis not present

## 2023-08-06 LAB — PROTIME-INR
INR: 1.6 — ABNORMAL HIGH (ref 0.8–1.2)
Prothrombin Time: 19.2 s — ABNORMAL HIGH (ref 11.4–15.2)

## 2023-08-06 LAB — BLOOD GAS, ARTERIAL
Acid-Base Excess: 5 mmol/L — ABNORMAL HIGH (ref 0.0–2.0)
Bicarbonate: 29.9 mmol/L — ABNORMAL HIGH (ref 20.0–28.0)
Drawn by: 11249
O2 Content: 15 L/min
O2 Saturation: 95.5 %
Patient temperature: 37.1
pCO2 arterial: 44 mm[Hg] (ref 32–48)
pH, Arterial: 7.44 (ref 7.35–7.45)
pO2, Arterial: 77 mm[Hg] — ABNORMAL LOW (ref 83–108)

## 2023-08-06 LAB — CBC WITH DIFFERENTIAL/PLATELET
Abs Immature Granulocytes: 0.05 10*3/uL (ref 0.00–0.07)
Basophils Absolute: 0 10*3/uL (ref 0.0–0.1)
Basophils Relative: 0 %
Eosinophils Absolute: 0 10*3/uL (ref 0.0–0.5)
Eosinophils Relative: 0 %
HCT: 33.8 % — ABNORMAL LOW (ref 39.0–52.0)
Hemoglobin: 11 g/dL — ABNORMAL LOW (ref 13.0–17.0)
Immature Granulocytes: 0 %
Lymphocytes Relative: 1 %
Lymphs Abs: 0.1 10*3/uL — ABNORMAL LOW (ref 0.7–4.0)
MCH: 29.3 pg (ref 26.0–34.0)
MCHC: 32.5 g/dL (ref 30.0–36.0)
MCV: 89.9 fL (ref 80.0–100.0)
Monocytes Absolute: 0.7 10*3/uL (ref 0.1–1.0)
Monocytes Relative: 6 %
Neutro Abs: 10.5 10*3/uL — ABNORMAL HIGH (ref 1.7–7.7)
Neutrophils Relative %: 93 %
Platelets: 345 10*3/uL (ref 150–400)
RBC: 3.76 MIL/uL — ABNORMAL LOW (ref 4.22–5.81)
RDW: 14.7 % (ref 11.5–15.5)
WBC: 11.4 10*3/uL — ABNORMAL HIGH (ref 4.0–10.5)
nRBC: 0 % (ref 0.0–0.2)

## 2023-08-06 LAB — GLUCOSE, CAPILLARY
Glucose-Capillary: 157 mg/dL — ABNORMAL HIGH (ref 70–99)
Glucose-Capillary: 166 mg/dL — ABNORMAL HIGH (ref 70–99)
Glucose-Capillary: 152 mg/dL — ABNORMAL HIGH (ref 70–99)
Glucose-Capillary: 127 mg/dL — ABNORMAL HIGH (ref 70–99)

## 2023-08-06 LAB — MAGNESIUM: Magnesium: 2.1 mg/dL (ref 1.7–2.4)

## 2023-08-06 MED ORDER — DILTIAZEM HCL 25 MG/5ML IV SOLN
10.0000 mg | Freq: Once | INTRAVENOUS | Status: AC
  Administered 2023-08-06: 10 mg via INTRAVENOUS
  Filled 2023-08-06: qty 5

## 2023-08-06 MED ORDER — METOPROLOL TARTRATE 5 MG/5ML IV SOLN
5.0000 mg | INTRAVENOUS | Status: DC | PRN
  Administered 2023-08-06 – 2023-08-10 (×12): 5 mg via INTRAVENOUS
  Filled 2023-08-06 (×12): qty 5

## 2023-08-06 MED ORDER — ALBUTEROL SULFATE (2.5 MG/3ML) 0.083% IN NEBU
2.5000 mg | INHALATION_SOLUTION | RESPIRATORY_TRACT | Status: DC | PRN
Start: 2023-08-06 — End: 2023-08-20
  Administered 2023-08-11: 2.5 mg via RESPIRATORY_TRACT
  Filled 2023-08-06: qty 3

## 2023-08-06 MED ORDER — SENNOSIDES-DOCUSATE SODIUM 8.6-50 MG PO TABS
1.0000 | ORAL_TABLET | Freq: Two times a day (BID) | ORAL | Status: DC
  Administered 2023-08-06: 1 via ORAL
  Filled 2023-08-06: qty 1

## 2023-08-06 MED ORDER — SORBITOL 70 % SOLN
30.0000 mL | Freq: Every day | Status: DC | PRN
  Administered 2023-08-09: 30 mL via ORAL
  Filled 2023-08-06: qty 30

## 2023-08-06 MED ORDER — HYDRALAZINE HCL 25 MG PO TABS: 25.0000 mg | ORAL_TABLET | ORAL | Status: DC | PRN

## 2023-08-06 MED ORDER — METOPROLOL TARTRATE 25 MG PO TABS
50.0000 mg | ORAL_TABLET | Freq: Two times a day (BID) | ORAL | Status: DC
  Administered 2023-08-06: 50 mg via ORAL
  Filled 2023-08-06: qty 2

## 2023-08-06 MED ORDER — LABETALOL HCL 5 MG/ML IV SOLN
10.0000 mg | INTRAVENOUS | Status: DC | PRN
  Administered 2023-08-06 – 2023-08-09 (×6): 10 mg via INTRAVENOUS
  Filled 2023-08-06 (×7): qty 4

## 2023-08-06 MED ORDER — METOPROLOL TARTRATE 5 MG/5ML IV SOLN
5.0000 mg | Freq: Once | INTRAVENOUS | Status: AC
  Administered 2023-08-06: 5 mg via INTRAVENOUS
  Filled 2023-08-06: qty 5

## 2023-08-06 MED ORDER — WARFARIN SODIUM 5 MG PO TABS
7.5000 mg | ORAL_TABLET | Freq: Once | ORAL | Status: AC
  Administered 2023-08-06: 7.5 mg via ORAL
  Filled 2023-08-06: qty 1

## 2023-08-06 MED ORDER — DILTIAZEM HCL 25 MG/5ML IV SOLN: 20.0000 mg | Freq: Once | INTRAVENOUS | Status: AC

## 2023-08-06 MED ORDER — LEVALBUTEROL HCL 0.63 MG/3ML IN NEBU: 0.6300 mg | INHALATION_SOLUTION | Freq: Three times a day (TID) | RESPIRATORY_TRACT | Status: DC

## 2023-08-06 MED ORDER — SALINE SPRAY 0.65 % NA SOLN
1.0000 | NASAL | Status: DC | PRN
  Administered 2023-08-06: 1 via NASAL
  Filled 2023-08-06: qty 44

## 2023-08-06 MED ORDER — POLYETHYLENE GLYCOL 3350 17 G PO PACK
17.0000 g | PACK | Freq: Every day | ORAL | Status: DC
  Administered 2023-08-06: 17 g via ORAL
  Filled 2023-08-06: qty 1

## 2023-08-06 MED ORDER — DILTIAZEM HCL 25 MG/5ML IV SOLN
INTRAVENOUS | Status: AC
  Administered 2023-08-06: 20 mg via INTRAVENOUS
  Filled 2023-08-06: qty 5

## 2023-08-06 MED ORDER — LEVALBUTEROL HCL 0.63 MG/3ML IN NEBU: 0.6300 mg | INHALATION_SOLUTION | Freq: Four times a day (QID) | RESPIRATORY_TRACT | Status: DC | PRN

## 2023-08-06 NOTE — Progress Notes (Signed)
Progress Note    Luis Brown   UVO:[id number]  DOB: May 08, 1946  DOA: 08/03/2023     3 PCP: Bradd Canary, MD  Initial CC: SOB, cough  Hospital Course: Luis Brown is a 78 yo male with PMH eosinophilic pneumonitis, ILD, pulmonary fibrosis, COPD, chronic hypoxia on 2 L O2 at home, PAF, history of PE and DVT on Coumadin, GERD, malnutrition, HLD, anxiety/depression, PMR who presented with cough, congestion, and worsening shortness of breath. He was tachycardic, tachypneic, and had leukocytosis on admission. CXR showed diffuse interstitial opacities.  Respiratory swabs were positive for RSV. He was started on steroids, nebs, and antibiotics.  Interval History:  Oxygen saturations adequate but he is having increased work of breathing which makes him feel panicked and anxious.  Adding morphine and Ativan this morning.  Wife present bedside this morning.  Assessment and Plan: Sepsis secondary to pneumonia CAP RSV infection - cough, dyspnea, congestion; tachycardia, tachypnea, leukocytosis; suspected respiratory infection -RSV swab positive -CXR likely showing underlying findings of ILD but difficult to rule out superimposed pneumonia in setting of worsened respiratory symptoms and leukocytosis noted -PCT elevated.  Continue antibiotics   COPD exacerbation History of pulmonary fibrosis -Patient has history of pulmonary fibrosis, COPD and interstitial lung disease.  History of intolerance to Pirfenidone and currently at home patient is on Trelegy once daily and albuterol as needed. - exac suspected in setting of RSV  - continue nebs and steroids  Sinus tach - sustained on 1/19; rate was around 150s to 160s. Patient asymptomatic. EKG noted with some ST depression likely tachy mediated and patient denied CP - did respond to vagal maneuvers - ultimately treated with IV lopressor 5 mg x 2 doses then 10 mg IV cardizem bolus x 2 followed by PO Lopressor 50 mg - if recurs and refractory  again for IV lopressor then may need to escalate next to adenosine - etiology suspected in setting of infection and physical deconditioning    Euvolemic hyponatremia -Serum sodium 121.  Patient baseline sodium is around 128-132. - Hyponatremia likely in the setting of poor oral intake - s/p IVF - now improved - trend BMP   Paroxysmal atrial fibrillation - CHA2DS2-VASc score = 5, based on age, history of CHF and diabetes. -Continue warfarin pharmacy protocol - usually rate controlled at home without meds    History of DVT and pulmonary embolism -Previously patient failed Xarelto and redeveloped DVT.  Currently patient is on Coumadin and aspirin.   Insulin dependent DM type II -A1c 8.8 checked in November 2024 - continue SSI and CBGs   Hyperlipidemia -Not on statin   BPH -Continue Flomax 0.8 mg daily   Generalized anxiety disorder Insomnia -Continue Effexor 150 mg daily, bupropion 50 mg 3 times daily   Peripheral neuropathy - Continue Lyrica 75 mg twice daily and Zanaflex as needed   History of dCHF    Old records reviewed in assessment of this patient  Antimicrobials: Rocephin 1/16 >> current Doxy 1/16 >> current   DVT prophylaxis:  SCDs Start: 08/03/23 2118 Place TED hose Start: 08/03/23 2118 warfarin (COUMADIN) tablet 7.5 mg   Code Status:   Code Status: Full Code  Mobility Assessment (Last 72 Hours)     Mobility Assessment   No documentation.           Barriers to discharge: none Disposition Plan:  Home HH orders placed: TBD Status is: Inpt  Objective: Blood pressure (!) 154/87, pulse 92, temperature 97.7 F (36.5 C), temperature source  Oral, resp. rate (!) 27, height 5\' 9"  (1.753 m), weight 90 kg, SpO2 91%.  Examination:  Physical Exam Constitutional:      General: He is not in acute distress.    Appearance: Normal appearance.  HENT:     Head: Normocephalic and atraumatic.     Mouth/Throat:     Mouth: Mucous membranes are moist.   Eyes:     Extraocular Movements: Extraocular movements intact.  Cardiovascular:     Rate and Rhythm: Normal rate and regular rhythm.  Pulmonary:     Effort: Pulmonary effort is normal. No respiratory distress.     Breath sounds: Wheezing present.     Comments: Diffuse coarse breath sounds bilaterally  Abdominal:     General: Bowel sounds are normal. There is no distension.     Palpations: Abdomen is soft.     Tenderness: There is no abdominal tenderness.  Musculoskeletal:        General: Normal range of motion.     Cervical back: Normal range of motion and neck supple.  Skin:    General: Skin is warm and dry.  Neurological:     General: No focal deficit present.     Mental Status: He is alert.  Psychiatric:        Mood and Affect: Mood normal.        Behavior: Behavior normal.      Consultants:    Procedures:    Data Reviewed: Results for orders placed or performed during the hospital encounter of 08/03/23 (from the past 24 hours)  Glucose, capillary     Status: Abnormal   Collection Time: 08/05/23  3:54 PM  Result Value Ref Range   Glucose-Capillary 267 (H) 70 - 99 mg/dL   Comment 1 Notify RN    Comment 2 Document in Chart   Glucose, capillary     Status: Abnormal   Collection Time: 08/05/23 10:10 PM  Result Value Ref Range   Glucose-Capillary 242 (H) 70 - 99 mg/dL  Protime-INR     Status: Abnormal   Collection Time: 08/06/23  2:42 AM  Result Value Ref Range   Prothrombin Time 19.2 (H) 11.4 - 15.2 seconds   INR 1.6 (H) 0.8 - 1.2  Magnesium     Status: None   Collection Time: 08/06/23  2:42 AM  Result Value Ref Range   Magnesium 2.1 1.7 - 2.4 mg/dL  CBC with Differential/Platelet     Status: Abnormal   Collection Time: 08/06/23  2:42 AM  Result Value Ref Range   WBC 11.4 (H) 4.0 - 10.5 K/uL   RBC 3.76 (L) 4.22 - 5.81 MIL/uL   Hemoglobin 11.0 (L) 13.0 - 17.0 g/dL   HCT 44.0 (L) 10.2 - 72.5 %   MCV 89.9 80.0 - 100.0 fL   MCH 29.3 26.0 - 34.0 pg   MCHC  32.5 30.0 - 36.0 g/dL   RDW 36.6 44.0 - 34.7 %   Platelets 345 150 - 400 K/uL   nRBC 0.0 0.0 - 0.2 %   Neutrophils Relative % 93 %   Neutro Abs 10.5 (H) 1.7 - 7.7 K/uL   Lymphocytes Relative 1 %   Lymphs Abs 0.1 (L) 0.7 - 4.0 K/uL   Monocytes Relative 6 %   Monocytes Absolute 0.7 0.1 - 1.0 K/uL   Eosinophils Relative 0 %   Eosinophils Absolute 0.0 0.0 - 0.5 K/uL   Basophils Relative 0 %   Basophils Absolute 0.0 0.0 - 0.1 K/uL  Immature Granulocytes 0 %   Abs Immature Granulocytes 0.05 0.00 - 0.07 K/uL  Glucose, capillary     Status: Abnormal   Collection Time: 08/06/23  7:32 AM  Result Value Ref Range   Glucose-Capillary 157 (H) 70 - 99 mg/dL   Comment 1 Notify RN   Glucose, capillary     Status: Abnormal   Collection Time: 08/06/23 11:18 AM  Result Value Ref Range   Glucose-Capillary 166 (H) 70 - 99 mg/dL   Comment 1 Notify RN    *Note: Due to a large number of results and/or encounters for the requested time period, some results have not been displayed. A complete set of results can be found in Results Review.    I have reviewed pertinent nursing notes, vitals, labs, and images as necessary. I have ordered labwork to follow up on as indicated.  I have reviewed the last notes from staff over past 24 hours. I have discussed patient's care plan and test results with nursing staff, CM/SW, and other staff as appropriate.  Critical care time to evaluate and treat this patient was 55 minutes.  Independent of separate billable services  This patient is critically ill with the following life-threatening issues requiring my presence at the bedside: Hemodynamic instability requiring titration of medications Oxygenation/ventilation instability requiring frequent modifications of support Cardiac rhythm disturbances requiring evaluation and/or interventions Fluctuations in neurologic function requiring evaluation and/or interventions and/or fluid/volume titration    LOS: 3 days    Lewie Chamber, MD [facility] 08/06/2023, 1:00 PM

## 2023-08-06 NOTE — Progress Notes (Signed)
PHARMACY - ANTICOAGULATION CONSULT NOTE  Pharmacy Consult for warfarin Indication: Afib, PE, DVT  Allergies  Allergen Reactions   Daptomycin     Drug induced pneumonia    Diltiazem Other (See Comments)    Passed out   Ofev [Nintedanib] Diarrhea    fatigue   Pirfenidone Diarrhea    fatigue    Patient Measurements: Height: 5\' 9"  (175.3 cm) Weight: 90 kg (198 lb 6.6 oz) IBW/kg (Calculated) : 70.7    Vital Signs: Temp: 97.7 F (36.5 C) (01/19 1121) Temp Source: Oral (01/19 1121) BP: 154/87 (01/19 1200) Pulse Rate: 92 (01/19 1200)  Labs: Recent Labs    08/03/23 1945 08/04/23 0346 08/05/23 0237 08/06/23 0242  HGB 13.2 11.9* 10.3* 11.0*  HCT 39.4 34.7* 32.4* 33.8*  PLT 346 338 331 345  LABPROT 34.0* 34.7* 28.2* 19.2*  INR 3.3* 3.4* 2.6* 1.6*  CREATININE 0.51* 0.49* 0.42*  --     Estimated Creatinine Clearance: 83 mL/min (A) (by C-G formula based on SCr of 0.42 mg/dL (L)).   Assessment: 78 yo M on warfarin PTA for Afib, hx PE & DVT. Pharmacy consulted to manage warfarin while admitted for PNA  Baseline INR mildly supratherapeutic Prior anticoagulation: warfarin 5mg  daily except 7.5mg  Tues/Sat - last dose taken 1/16 AM  Significant events:  Today, 08/06/2023: CBC: Hgb low but stable/improved today; Plt stable WNL INR now decreased to SUBtherapeutic range after holding warfarin 1/17 and resuming yesterday at PTA dosing Major drug interactions: broad spectrum abx for PNA can increase warfarin sensitivity No bleeding issues per nursing Diet ordered, intake not charted  Goal of Therapy: INR 2-3  Plan: Warfarin 7.5 mg today Daily INR CBC at least q72 hr while on warfarin Monitor for signs of bleeding or thrombosis   Bernadene Person, PharmD, BCPS [phone] 08/06/2023, 12:52 PM

## 2023-08-06 NOTE — Plan of Care (Signed)
  Problem: Education: Goal: Knowledge of General Education information will improve Description: Including pain rating scale, medication(s)/side effects and non-pharmacologic comfort measures Outcome: Progressing   Problem: Coping: Goal: Level of anxiety will decrease Outcome: Not Progressing

## 2023-08-06 NOTE — Progress Notes (Addendum)
Overnight   NAME: Luis Brown MRN: [id number] DOB : [birth year]    Date of Service   08/06/2023   HPI/Events of Note    Notified by RN for Agitation, restlessness, A-fib recurrent, and desaturation due to removing O2.  Bedside: Patient is given Lopressor and Diltiazem now, HR appears at to be driven by anxiety and reduced O2.   ABG in process. Switched nebs to Xopenex.  May require restraints for interference with devices and confusion.    Interventions/ Plan   ABG HHF Xopenex Consider Cardizem drip if needed after Control of O2      Update 2120 hrs O2 responded immediately with HHF Requires restraints due to interfering with O2 and other devices in spite of anti-anxiety meds.  ABG -pending  Urinary output of multiple small amounts, Post voids = >300cc Difficulty with I&O so will retain indwelling.   Update 2150 hrs   Latest Reference Range & Units 08/06/23 21:14  O2 Content L/min 15.0  pH, Arterial 7.35 - 7.45  7.44  pCO2 arterial 32 - 48 mmHg 44  pO2, Arterial 83 - 108 mmHg 77 (L)  Acid-Base Excess 0.0 - 2.0 mmol/L 5.0 (H)  Bicarbonate 20.0 - 28.0 mmol/L 29.9 (H)  O2 Saturation % 95.5  Patient temperature  37.1  Collection site  RIGHT RADIAL  Allens test (pass/fail) PASS  PASS  (L): Data is abnormally low (H): Data is abnormally high  SPO2 is now 100% on HHF w reduced agitation  Lopressor scheduled dosing - pending administration                                                                                                                                                   ICU Consult    NAME:  Luis Brown, MRN:  [id number], DOB:  11/16/1945, LOS: 3 ADMISSION DATE:  08/03/2023,  CONSULTATION DATE:  08/06/23 REFERRING:  Johann Capers ACNPC,  CHIEF COMPLAINT:  Respiratory Failure requiring Ventilator    Brief History   Please see above timeline  History of present illness   78 year old male with eosinophilic pneumonitis, ILD, pulmonary  fibrosis, COPD, chronic hypoxia on 2 L O2 at home, PAF, 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 Has been tachycardic, tachypneic with leukocytosis on admission.  Past Medical History  Acute pharyngitis Anxiety depression BPH Chickenpox Degenerative disc disease and lumbosacral Diabetes mellitus Dyspnea ED Hypertension Epidural abscess 2021 Esophageal reflux Fatigue hyperglycemia Hyperlipidemia Insomnia Low back pain with radiation to both legs Measles as a child Peripheral neuropathy Colon polyps Pneumonia Testosterone deficiency Eyeglasses Significant Hospital Events   Tachycardia-requiring Lopressor, diltiazem, and increased oxygenation with agitation and restlessness  Consults:  Request for CCM to evaluate  Procedures:  Heated high flow  Significant Diagnostic Tests:    Latest Reference Range & Units 08/06/23 21:14  O2 Content L/min 15.0  pH, Arterial 7.35 - 7.45  7.44  pCO2 arterial 32 - 48 mmHg 44  pO2, Arterial 83 - 108 mmHg 77 (L)  Acid-Base Excess 0.0 - 2.0 mmol/L 5.0 (H)  Bicarbonate 20.0 - 28.0 mmol/L 29.9 (H)  O2 Saturation % 95.5  Patient temperature  37.1  Collection site  RIGHT RADIAL  Allens test (pass/fail) PASS  PASS  (L): Data is abnormally low (H): Data is abnormally high  Micro Data:  RSV positive on 08/04/2023  Antimicrobials:  Ceftriaxone 2 g IVPB every 24 hours  Interim history/subjective:  Increased oxygen requirement with agitation and tachycardia in spite of medications  Objective   Blood pressure (!) 131/101, pulse (!) 155, temperature 98.8 F (37.1 C), temperature source Oral, resp. rate (!) 28, height 5\' 9"  (1.753 m), weight 90 kg, SpO2 93%.    FiO2 (%):  [80 %] 80 %   Intake/Output Summary (Last 24 hours) at 08/06/2023 2339 Last data filed at 08/06/2023 2200 Gross per 24 hour  Intake 1179.21 ml  Output 2045  ml  Net -865.79 ml   Filed Weights   08/03/23 1857  Weight: 90 kg    Examination: General: alert disoriented/agitated/restless Lungs: Increased work of breathing Cardiovascular: RRR, no JVD  Abdomen: soft, nontender  Extremities: warm, well-perfused without cyanosis Neuro: Restless/ agitated decreasing following of commands  Assessment & Plan:  Request CCM for bedside visit/consult  Best practice:  Diet: N.p.o. Pain/Anxiety/Delirium protocol DVT prophylaxis: Warfarin GI prophylaxis:  Glucose control: Sliding scale insulin Mobility: As able Code Status: Full code Family Communication: Assigned RN Disposition: Currently in ICU with SDU status  Labs   CBC: Recent Labs  Lab 08/03/23 1945 08/04/23 0346 08/05/23 0237 08/06/23 0242  WBC 14.4* 13.6* 10.9* 11.4*  NEUTROABS 13.4*  --  9.9* 10.5*  HGB 13.2 11.9* 10.3* 11.0*  HCT 39.4 34.7* 32.4* 33.8*  MCV 85.5 86.3 88.3 89.9  PLT 346 338 331 345    Basic Metabolic Panel: Recent Labs  Lab 08/03/23 1945 08/03/23 2200 08/04/23 0346 08/04/23 0804 08/04/23 1040 08/05/23 0237 08/06/23 0242  NA 121* 119* 120* 124* 123* 129*  --   K 4.1  --  4.0  --   --  3.8  --   CL 85*  --  87*  --   --  100  --   CO2 23  --  22  --   --  22  --   GLUCOSE 327*  --  367*  --   --  147*  --   BUN 17  --  16  --   --  16  --   CREATININE 0.51*  --  0.49*  --   --  0.42*  --   CALCIUM 8.7*  --  8.7*  --   --  8.4*  --   MG  --   --   --   --   --  2.0 2.1   GFR: Estimated Creatinine Clearance: 83 mL/min (A) (by C-G formula based on SCr of 0.42 mg/dL (L)). Recent Labs  Lab 08/03/23 1945 08/03/23 2200 08/04/23 0346 08/04/23 0804 08/04/23 1040 08/05/23 0237 08/06/23 0242  PROCALCITON  --   --   --   --  6.65  --   --   WBC 14.4*  --  13.6*  --   --  10.9* 11.4*  LATICACIDVEN  --  2.1* 2.3* 2.2* 1.6  --   --     Liver Function Tests: Recent Labs  Lab 08/04/23 0346 08/05/23 0237  AST 26 22  ALT 18 14  ALKPHOS 67 56   BILITOT 1.1 0.5  PROT 6.0* 5.3*  ALBUMIN 3.4* 2.8*   No results for input(s): "LIPASE", "AMYLASE" in the last 168 hours. No results for input(s): "AMMONIA" in the last 168 hours.  ABG    Component Value Date/Time   PHART 7.44 08/06/2023 2114   PCO2ART 44 08/06/2023 2114   PO2ART 77 (L) 08/06/2023 2114   HCO3 29.9 (H) 08/06/2023 2114   TCO2 24 01/28/2007 1537   ACIDBASEDEF 0.9 07/17/2021 0428   O2SAT 95.5 08/06/2023 2114     Coagulation Profile: Recent Labs  Lab 08/03/23 1945 08/04/23 0346 08/05/23 0237 08/06/23 0242  INR 3.3* 3.4* 2.6* 1.6*    Cardiac Enzymes: No results for input(s): "CKTOTAL", "CKMB", "CKMBINDEX", "TROPONINI" in the last 168 hours.  HbA1C: Hgb A1c MFr Bld  Date/Time Value Ref Range Status  05/19/2023 01:35 PM 8.8 (H) 4.6 - 6.5 % Final    Comment:    Glycemic Control Guidelines for People with Diabetes:Non Diabetic:  <6%Goal of Therapy: <7%Additional Action Suggested:  >8%   12/22/2022 01:21 PM 10.5 (H) 4.6 - 6.5 % Final    Comment:    Glycemic Control Guidelines for People with Diabetes:Non Diabetic:  <6%Goal of Therapy: <7%Additional Action Suggested:  >8%     CBG: Recent Labs  Lab 08/05/23 2210 08/06/23 0732 08/06/23 1118 08/06/23 1637 08/06/23 2203  GLUCAP 242* 157* 166* 152* 127*    Review of Systems:   A twelve point review of systems is unable to obtain  Past Medical History  He,  has a past medical history of Acute pharyngitis (10/21/2013), Allergy, Anxiety and depression (10/25/2011), BPH (benign prostatic hyperplasia) (04/23/2012), Chicken pox (as a child), DDD (degenerative disc disease), DDD (degenerative disc disease), lumbosacral, Diabetes mellitus, Dyspnea, ED (erectile dysfunction) (04/23/2012), Elevated BP, Epidural abscess (10/15/2019), Esophageal reflux (02/10/2015), Fatigue, HTN (hypertension), Hyperglycemia, Hyperlipidemia, Insomnia, Low back pain radiating to both legs (01/16/2017), Measles (as a child), Overweight(278.02),  Peripheral neuropathy (08/07/2019), Personal history of colonic polyps (10/27/2012), Pneumonia, Preventative health care, Testosterone deficiency (05/23/2012), and Wears glasses.   Surgical History    Past Surgical History:  Procedure Laterality Date   ATRIAL FIBRILLATION ABLATION N/A 01/27/2022   Procedure: ATRIAL FIBRILLATION ABLATION;  Surgeon: Regan Lemming, MD;  Location: [facility];  Service: Cardiovascular;  Laterality: N/A;   BACK SURGERY  2012 and 1994   Dr Charlesetta Garibaldi, screws and cage in low back   BIOPSY  01/09/2020   Procedure: BIOPSY;  Surgeon: Kathi Der, MD;  Location: [facility];  Service: Gastroenterology;;   BRONCHIAL BIOPSY  11/17/2020   Procedure: BRONCHIAL BIOPSIES;  Surgeon: Tomma Lightning, MD;  Location: [facility];  Service: Endoscopy;;   BRONCHIAL BRUSHINGS  11/17/2020   Procedure: BRONCHIAL BRUSHINGS;  Surgeon: Tomma Lightning, MD;  Location: [facility];  Service: Endoscopy;;   BRONCHIAL WASHINGS  11/17/2020   Procedure: BRONCHIAL WASHINGS;  Surgeon: Tomma Lightning, MD;  Location: [facility];  Service: Endoscopy;;   COLONOSCOPY WITH PROPOFOL N/A 01/09/2020   Procedure: COLONOSCOPY WITH PROPOFOL;  Surgeon: Kathi Der, MD;  Location: [facility];  Service: Gastroenterology;  Laterality: N/A;   ESOPHAGOGASTRODUODENOSCOPY (EGD) WITH PROPOFOL N/A 01/09/2020   Procedure: ESOPHAGOGASTRODUODENOSCOPY (EGD) WITH PROPOFOL;  Surgeon: Kathi Der, MD;  Location: [facility];  Service: Gastroenterology;  Laterality:  N/A;   EYE SURGERY Bilateral    2016   HARDWARE REVISION  03/12/2019   REVISION OF HARDWARE THORACIC TEN-THORACIC ELEVEN WITH APPLICATION OF ADDITIONAL RODS AND ROD SLEEVES THORACIC EIGHT-THORACIC TWELVE 12-LUMBAR ONE (N/A )   HEMOSTASIS CONTROL  11/17/2020   Procedure: HEMOSTASIS CONTROL;  Surgeon: Tomma Lightning, MD;  Location: [facility];  Service: Endoscopy;;   IR US GUIDE BX ASP/DRAIN  11/13/2020   LAMINECTOMY   02/12/2019   DECOMPRESSIVE LAMINECTOMY THORACIC NINE-THORACIC TEN AND THORACIC TEN-THORACIC ELEVEN, EXTENSION OF THORACOLUMBAR FUSION FROM THORACIC TEN TO THORACIC FIVE, LOCAL BONE GRAFT, ALLOGRAFT AND VIVIGEN (N/A)   POLYPECTOMY  01/09/2020   Procedure: POLYPECTOMY;  Surgeon: Kathi Der, MD;  Location: [facility];  Service: Gastroenterology;;   REMOVAL OF RODS AND PEDICLE SCREWS T5 AND T6, EXPLORATION OF FUSION, BIOPSY TRANSPEDICULAR T5 AND T6 (N/A )  09/14/2020   SPINAL FUSION N/A 02/12/2019   Procedure: DECOMPRESSIVE LAMINECTOMY THORACIC NINE-THORACIC TEN AND THORACIC TEN-THORACIC ELEVEN, EXTENSION OF THORACOLUMBAR FUSION FROM THORACIC TEN TO THORACIC FIVE, LOCAL BONE GRAFT, ALLOGRAFT AND VIVIGEN;  Surgeon: Kerrin Champagne, MD;  Location: [facility];  Service: Orthopedics;  Laterality: N/A;  DECOMPRESSIVE LAMINECTOMY THORACIC NINE-THORACIC TEN AND THORACIC TEN-THORACIC ELEVEN, EXTENSION OF TH   SPINAL FUSION N/A 03/12/2019   Procedure: REVISION OF HARDWARE THORACIC TEN-THORACIC ELEVEN WITH APPLICATION OF ADDITIONAL RODS AND ROD SLEEVES THORACIC EIGHT-THORACIC TWELVE 12-LUMBAR ONE;  Surgeon: Kerrin Champagne, MD;  Location: [facility];  Service: Orthopedics;  Laterality: N/A;   TONSILLECTOMY     as child   torn rotator cuff  2010   right   VIDEO BRONCHOSCOPY N/A 11/17/2020   Procedure: VIDEO BRONCHOSCOPY WITH FLUORO;  Surgeon: Tomma Lightning, MD;  Location: [facility];  Service: Endoscopy;  Laterality: N/A;     Social History   reports that he quit smoking about 34 years ago. His smoking use included cigarettes. He started smoking about 54 years ago. He has a 20 pack-year smoking history. He has never used smokeless tobacco. He reports that he does not currently use alcohol. He reports that he does not use drugs.   Family History   His family history includes COPD in his father; Cancer (age of onset: 59) in his mother; Diabetes in his mother; Emphysema in his father; Heart disease in his  daughter, father, maternal grandfather, and paternal grandfather; Hyperlipidemia in his sister; Hypertension in his maternal grandmother, mother, and sister; Osteoporosis in his sister; Scoliosis in his maternal grandmother and sister; Stroke (age of onset: 65) in his father.   Allergies Allergies  Allergen Reactions   Daptomycin     Drug induced pneumonia    Diltiazem Other (See Comments)    Passed out   Ofev [Nintedanib] Diarrhea    fatigue   Pirfenidone Diarrhea    fatigue     Home Medications  Prior to Admission medications   Medication Sig Start Date End Date Taking? Authorizing Provider  albuterol (PROVENTIL HFA) 108 (90 Base) MCG/ACT inhaler TAKE 2 PUFFS BY MOUTH EVERY 6 HOURS AS NEEDED FOR WHEEZE OR SHORTNESS OF BREATH 03/16/23  Yes Kalman Shan, MD  busPIRone (BUSPAR) 15 MG tablet Take 1 tablet (15 mg total) by mouth 3 (three) times daily. Patient taking differently: Take 15 mg by mouth 3 (three) times daily as needed. 04/26/23  Yes Bradd Canary, MD  Cyanocobalamin (VITAMIN B-12 PO) Take by mouth.   Yes [provider]  dextromethorphan-guaiFENesin (MUCINEX DM) 30-600 MG 12hr tablet Take 1 tablet  by mouth 2 (two) times daily.   Yes [provider]  fluticasone (FLONASE) 50 MCG/ACT nasal spray SPRAY 2 SPRAYS INTO EACH NOSTRIL EVERY DAY 05/02/22  Yes Saguier, Ramon Dredge, PA-C  Fluticasone-Umeclidin-Vilant (TRELEGY ELLIPTA) 200-62.5-25 MCG/ACT AEPB Inhale 1 puff into the lungs daily. 03/27/23  Yes Kalman Shan, MD  insulin glargine (LANTUS SOLOSTAR) 100 UNIT/ML Solostar Pen Inject 26 Units into the skin daily. 02/17/23  Yes Bradd Canary, MD  insulin lispro (HUMALOG) 100 UNIT/ML injection Inject 0.03 mLs (3 Units total) into the skin 3 (three) times daily with meals. Patient taking differently: Inject 3 Units into the skin 3 (three) times daily as needed for high blood sugar. 11/04/22  Yes Bradd Canary, MD  meclizine (ANTIVERT) 12.5 MG tablet Take 1 tablet  (12.5 mg total) by mouth 3 (three) times daily as needed for dizziness. 04/13/23  Yes Bradd Canary, MD  methylPREDNISolone (MEDROL DOSEPAK) 4 MG TBPK tablet 6 tabs po x 1 day then 5 tabs po x 1 day then 4 tabs po x 1 day then 3 tabs po x 1 day then 2 tabs po x 1 day then 1 tab po x 1 day and stop 08/02/23  Yes Bradd Canary, MD  oxyCODONE-acetaminophen (PERCOCET/ROXICET) 5-325 MG tablet Take 1 tablet by mouth every 4 (four) hours as needed for pain- frequency change 08/17/22  Yes   pantoprazole (PROTONIX) 40 MG tablet TAKE 1 TABLET (40 MG TOTAL) BY MOUTH TWICE A DAY BEFORE MEALS Patient taking differently: Take 40 mg by mouth 2 (two) times daily as needed (indigestion). 08/02/23  Yes Bradd Canary, MD  predniSONE (DELTASONE) 5 MG tablet TAKE 1 TABLET (5 MG TOTAL) BY MOUTH DAILY. 07/24/23  Yes Kalman Shan, MD  pregabalin (LYRICA) 75 MG capsule TAKE 1 CAPSULE BY MOUTH TWICE A DAY Patient taking differently: Take 75 mg by mouth 2 (two) times daily. 03/09/20  Yes Kerrin Champagne, MD  sitaGLIPtin (JANUVIA) 50 MG tablet Take 1 tablet (50 mg total) by mouth daily. 07/04/23  Yes Bradd Canary, MD  tamsulosin (FLOMAX) 0.4 MG CAPS capsule TAKE 2 CAPSULES BY MOUTH EVERY DAY Patient taking differently: Take 0.8 mg by mouth every evening. 06/02/23  Yes Bradd Canary, MD  tirzepatide [facility]) 2.5 MG/0.5ML Pen Inject 2.5 mg into the skin once a week. 05/25/23  Yes Bradd Canary, MD  tiZANidine (ZANAFLEX) 4 MG tablet Take 1 and 1/2 tablets (6 mg total) by mouth every 4 hours. Patient taking differently: Take 4-6 mg by mouth every 6 (six) hours as needed for muscle spasms. 08/11/22  Yes   venlafaxine XR (EFFEXOR-XR) 150 MG 24 hr capsule Take 1 capsule (150 mg total) by mouth daily with breakfast. 06/06/23  Yes Bradd Canary, MD  warfarin (COUMADIN) 5 MG tablet TAKE 1 TO 2 TABLETS DAILY OR AS PRESCRIBED BY COUMADIN CLINIC Patient taking differently: Take 5-7.5 mg by mouth as directed. TAKE 1.5 TABLETS  (7.5 MG) ON (TUESDAYS & SATURDAYS) & TAKE 1 TABLET (5 MG) ALL OTHER DAYS AS PRESCRIBED BY COUMADIN CLINIC 02/16/23  Yes Tobb, Kardie, DO  zolpidem (AMBIEN) 10 MG tablet Take 0.5-1 tablets (5-10 mg total) by mouth at bedtime as needed for sleep. Patient taking differently: Take 10 mg by mouth at bedtime. 08/03/23  Yes Worthy Rancher B, FNP  glucose blood (CONTOUR NEXT TEST) test strip Check blood sugar once daily 02/25/19   Saguier, Ramon Dredge, PA-C  Insulin Pen Needle (B-D UF III MINI PEN NEEDLES) 31G X  5 MM MISC USE WITH LANTUS 12/01/22   Bradd Canary, MD  Insulin Pen Needle 32G X 4 MM MISC Use with insulin 12/15/22   Bradd Canary, MD  Insulin Syringe-Needle U-100 (B-D INS SYR ULTRAFINE 1CC/30G) 30G X 1/2" 1 ML MISC USE WITH LANTUS 02/01/23   Bradd Canary, MD  Insulin Syringes, Disposable, U-100 1 ML MISC USE WITH LANTUS 12/02/22   Bradd Canary, MD  Pirfenidone 267 MG TABS Take 2 tablets (534 mg total) by mouth 3 (three) times daily with meals. Patient not taking: Reported on 08/03/2023 07/05/23   Kalman Shan, MD        Chinita Greenland BSN MSNA MSN ACNPC-AG Acute Care Nurse Practitioner [facility]

## 2023-08-07 ENCOUNTER — Inpatient Hospital Stay (HOSPITAL_COMMUNITY): Payer: Medicare Other

## 2023-08-07 DIAGNOSIS — R000 Tachycardia, unspecified: Secondary | ICD-10-CM | POA: Diagnosis not present

## 2023-08-07 DIAGNOSIS — I480 Paroxysmal atrial fibrillation: Secondary | ICD-10-CM | POA: Diagnosis not present

## 2023-08-07 DIAGNOSIS — G9341 Metabolic encephalopathy: Secondary | ICD-10-CM | POA: Diagnosis not present

## 2023-08-07 DIAGNOSIS — J9621 Acute and chronic respiratory failure with hypoxia: Secondary | ICD-10-CM | POA: Diagnosis not present

## 2023-08-07 DIAGNOSIS — J189 Pneumonia, unspecified organism: Secondary | ICD-10-CM | POA: Diagnosis not present

## 2023-08-07 DIAGNOSIS — J84112 Idiopathic pulmonary fibrosis: Secondary | ICD-10-CM

## 2023-08-07 DIAGNOSIS — J441 Chronic obstructive pulmonary disease with (acute) exacerbation: Secondary | ICD-10-CM | POA: Diagnosis not present

## 2023-08-07 DIAGNOSIS — A498 Other bacterial infections of unspecified site: Secondary | ICD-10-CM

## 2023-08-07 LAB — CBC WITH DIFFERENTIAL/PLATELET
Abs Immature Granulocytes: 0.05 10*3/uL (ref 0.00–0.07)
Basophils Absolute: 0 10*3/uL (ref 0.0–0.1)
Basophils Relative: 0 %
Eosinophils Absolute: 0 10*3/uL (ref 0.0–0.5)
Eosinophils Relative: 0 %
HCT: 39.2 % (ref 39.0–52.0)
Hemoglobin: 12.6 g/dL — ABNORMAL LOW (ref 13.0–17.0)
Immature Granulocytes: 0 %
Lymphocytes Relative: 1 %
Lymphs Abs: 0.1 10*3/uL — ABNORMAL LOW (ref 0.7–4.0)
MCH: 28.1 pg (ref 26.0–34.0)
MCHC: 32.1 g/dL (ref 30.0–36.0)
MCV: 87.3 fL (ref 80.0–100.0)
Monocytes Absolute: 0.8 10*3/uL (ref 0.1–1.0)
Monocytes Relative: 7 %
Neutro Abs: 11.6 10*3/uL — ABNORMAL HIGH (ref 1.7–7.7)
Neutrophils Relative %: 92 %
Platelets: 383 10*3/uL (ref 150–400)
RBC: 4.49 MIL/uL (ref 4.22–5.81)
RDW: 14.8 % (ref 11.5–15.5)
WBC: 12.6 10*3/uL — ABNORMAL HIGH (ref 4.0–10.5)
nRBC: 0 % (ref 0.0–0.2)

## 2023-08-07 LAB — BASIC METABOLIC PANEL
Anion gap: 12 (ref 5–15)
BUN: 25 mg/dL — ABNORMAL HIGH (ref 8–23)
CO2: 26 mmol/L (ref 22–32)
Calcium: 8.4 mg/dL — ABNORMAL LOW (ref 8.9–10.3)
Chloride: 95 mmol/L — ABNORMAL LOW (ref 98–111)
Creatinine, Ser: 0.47 mg/dL — ABNORMAL LOW (ref 0.61–1.24)
GFR, Estimated: >60 mL/min (ref >60)
Glucose, Bld: 161 mg/dL — ABNORMAL HIGH (ref 70–99)
Potassium: 3.3 mmol/L — ABNORMAL LOW (ref 3.5–5.1)
Sodium: 133 mmol/L — ABNORMAL LOW (ref 135–145)

## 2023-08-07 LAB — CULTURE, RESPIRATORY W GRAM STAIN: Gram Stain: NONE SEEN

## 2023-08-07 LAB — GLUCOSE, CAPILLARY
Glucose-Capillary: 202 mg/dL — ABNORMAL HIGH (ref 70–99)
Glucose-Capillary: 202 mg/dL — ABNORMAL HIGH (ref 70–99)
Glucose-Capillary: 202 mg/dL — ABNORMAL HIGH (ref 70–99)
Glucose-Capillary: 233 mg/dL — ABNORMAL HIGH (ref 70–99)
Glucose-Capillary: 281 mg/dL — ABNORMAL HIGH (ref 70–99)

## 2023-08-07 LAB — LEGIONELLA PNEUMOPHILA SEROGP 1 UR AG: L. pneumophila Serogp 1 Ur Ag: NEGATIVE

## 2023-08-07 LAB — MAGNESIUM: Magnesium: 2 mg/dL (ref 1.7–2.4)

## 2023-08-07 LAB — PROTIME-INR
INR: 1.8 — ABNORMAL HIGH (ref 0.8–1.2)
Prothrombin Time: 21.4 s — ABNORMAL HIGH (ref 11.4–15.2)

## 2023-08-07 MED ORDER — GUAIFENESIN-DM 100-10 MG/5ML PO SYRP
5.0000 mL | ORAL_SOLUTION | ORAL | Status: DC | PRN
  Administered 2023-08-08 – 2023-08-19 (×7): 5 mL via ORAL
  Filled 2023-08-07 (×2): qty 10
  Filled 2023-08-07: qty 5
  Filled 2023-08-07 (×4): qty 10

## 2023-08-07 MED ORDER — LORAZEPAM 2 MG/ML IJ SOLN
1.0000 mg | INTRAMUSCULAR | Status: DC | PRN
  Administered 2023-08-07 – 2023-08-08 (×3): 1 mg via INTRAVENOUS
  Filled 2023-08-07 (×2): qty 1

## 2023-08-07 MED ORDER — SULFAMETHOXAZOLE-TRIMETHOPRIM 400-80 MG/5ML IV SOLN
300.0000 mg | Freq: Three times a day (TID) | INTRAVENOUS | Status: DC
  Filled 2023-08-07: qty 18.75

## 2023-08-07 MED ORDER — POTASSIUM CHLORIDE 10 MEQ/100ML IV SOLN
10.0000 meq | INTRAVENOUS | Status: AC
  Administered 2023-08-07 (×4): 10 meq via INTRAVENOUS
  Filled 2023-08-07 (×4): qty 100

## 2023-08-07 MED ORDER — SODIUM CHLORIDE 0.9 % IV SOLN: INTRAVENOUS | Status: AC | PRN

## 2023-08-07 MED ORDER — SULFAMETHOXAZOLE-TRIMETHOPRIM 400-80 MG/5ML IV SOLN
320.0000 mg | Freq: Three times a day (TID) | INTRAVENOUS | Status: DC
  Filled 2023-08-07: qty 20

## 2023-08-07 MED ORDER — IPRATROPIUM BROMIDE 0.02 % IN SOLN
0.5000 mg | Freq: Four times a day (QID) | RESPIRATORY_TRACT | Status: DC
  Filled 2023-08-07: qty 2.5

## 2023-08-07 MED ORDER — LEVALBUTEROL HCL 0.63 MG/3ML IN NEBU
0.6300 mg | INHALATION_SOLUTION | Freq: Four times a day (QID) | RESPIRATORY_TRACT | Status: DC
  Filled 2023-08-07: qty 3

## 2023-08-07 MED ORDER — MORPHINE SULFATE (PF) 2 MG/ML IV SOLN
2.0000 mg | INTRAVENOUS | Status: DC
  Administered 2023-08-07 (×3): 2 mg via INTRAVENOUS
  Filled 2023-08-07 (×3): qty 1

## 2023-08-07 MED ORDER — OLANZAPINE 5 MG PO TBDP
5.0000 mg | ORAL_TABLET | Freq: Every day | ORAL | Status: DC
  Administered 2023-08-07: 5 mg via ORAL
  Filled 2023-08-07: qty 1

## 2023-08-07 MED ORDER — SULFAMETHOXAZOLE-TRIMETHOPRIM 400-80 MG/5ML IV SOLN
160.0000 mg | Freq: Two times a day (BID) | INTRAVENOUS | Status: DC
  Filled 2023-08-07: qty 10

## 2023-08-07 MED ORDER — LEVOFLOXACIN IN D5W 750 MG/150ML IV SOLN
750.0000 mg | INTRAVENOUS | Status: AC
  Administered 2023-08-07 – 2023-08-13 (×7): 750 mg via INTRAVENOUS
  Filled 2023-08-07 (×7): qty 150

## 2023-08-07 MED ORDER — ENOXAPARIN SODIUM 100 MG/ML IJ SOSY
90.0000 mg | PREFILLED_SYRINGE | Freq: Two times a day (BID) | INTRAMUSCULAR | Status: DC
  Administered 2023-08-07 – 2023-08-15 (×18): 90 mg via SUBCUTANEOUS
  Filled 2023-08-07 (×18): qty 1

## 2023-08-07 MED ORDER — INSULIN ASPART 100 UNIT/ML IJ SOLN
0.0000 [IU] | INTRAMUSCULAR | Status: DC
  Administered 2023-08-07: 5 [IU] via SUBCUTANEOUS
  Administered 2023-08-07: 8 [IU] via SUBCUTANEOUS
  Administered 2023-08-07: 5 [IU] via SUBCUTANEOUS
  Administered 2023-08-08: 8 [IU] via SUBCUTANEOUS
  Administered 2023-08-08: 5 [IU] via SUBCUTANEOUS
  Administered 2023-08-08: 8 [IU] via SUBCUTANEOUS
  Administered 2023-08-08: 15 [IU] via SUBCUTANEOUS
  Administered 2023-08-08: 3 [IU] via SUBCUTANEOUS
  Administered 2023-08-08 (×2): 5 [IU] via SUBCUTANEOUS
  Administered 2023-08-09 (×2): 2 [IU] via SUBCUTANEOUS
  Administered 2023-08-09: 8 [IU] via SUBCUTANEOUS
  Administered 2023-08-09 – 2023-08-10 (×2): 11 [IU] via SUBCUTANEOUS
  Administered 2023-08-10: 5 [IU] via SUBCUTANEOUS
  Administered 2023-08-10 (×2): 3 [IU] via SUBCUTANEOUS
  Administered 2023-08-10: 11 [IU] via SUBCUTANEOUS
  Administered 2023-08-10: 8 [IU] via SUBCUTANEOUS
  Administered 2023-08-11 (×2): 3 [IU] via SUBCUTANEOUS
  Administered 2023-08-11: 5 [IU] via SUBCUTANEOUS

## 2023-08-07 MED ORDER — MINOCYCLINE HCL 50 MG PO CAPS
200.0000 mg | ORAL_CAPSULE | Freq: Two times a day (BID) | ORAL | Status: DC
Start: 2023-08-07 — End: 2023-08-07
  Filled 2023-08-07: qty 4

## 2023-08-07 MED ORDER — CEFAZOLIN SODIUM-DEXTROSE 2-4 GM/100ML-% IV SOLN
2.0000 g | Freq: Three times a day (TID) | INTRAVENOUS | Status: DC
  Administered 2023-08-07 – 2023-08-10 (×9): 2 g via INTRAVENOUS
  Filled 2023-08-07 (×9): qty 100

## 2023-08-07 MED ORDER — MORPHINE SULFATE (PF) 2 MG/ML IV SOLN
2.0000 mg | INTRAVENOUS | Status: DC | PRN
  Administered 2023-08-07 – 2023-08-09 (×6): 2 mg via INTRAVENOUS
  Filled 2023-08-07 (×6): qty 1

## 2023-08-07 MED ORDER — OLANZAPINE 10 MG IM SOLR: 5.0000 mg | Freq: Every evening | INTRAMUSCULAR | Status: DC | PRN

## 2023-08-07 MED ORDER — DILTIAZEM HCL 25 MG/5ML IV SOLN
10.0000 mg | Freq: Once | INTRAVENOUS | Status: AC
  Administered 2023-08-07: 10 mg via INTRAVENOUS

## 2023-08-07 MED ORDER — DEXMEDETOMIDINE HCL IN NACL 200 MCG/50ML IV SOLN
0.0000 ug/kg/h | INTRAVENOUS | Status: DC
  Administered 2023-08-07: 1 ug/kg/h via INTRAVENOUS
  Administered 2023-08-07: 0.8 ug/kg/h via INTRAVENOUS
  Administered 2023-08-07: 0.4 ug/kg/h via INTRAVENOUS
  Administered 2023-08-07: 0.5 ug/kg/h via INTRAVENOUS
  Administered 2023-08-07: 0.4 ug/kg/h via INTRAVENOUS
  Administered 2023-08-07: 0.8 ug/kg/h via INTRAVENOUS
  Administered 2023-08-07 – 2023-08-08 (×2): 0.5 ug/kg/h via INTRAVENOUS
  Filled 2023-08-07 (×8): qty 50

## 2023-08-07 MED ORDER — FUROSEMIDE 10 MG/ML IJ SOLN
60.0000 mg | Freq: Once | INTRAMUSCULAR | Status: AC
  Administered 2023-08-07: 60 mg via INTRAVENOUS
  Filled 2023-08-07: qty 6

## 2023-08-07 MED ORDER — SULFAMETHOXAZOLE-TRIMETHOPRIM 400-80 MG/5ML IV SOLN
300.0000 mg | Freq: Three times a day (TID) | INTRAVENOUS | Status: DC
  Filled 2023-08-07 (×2): qty 18.75

## 2023-08-07 NOTE — Progress Notes (Signed)
PHARMACY - ANTICOAGULATION CONSULT NOTE  Pharmacy Consult for warfarin >> Lovenox Indication: Afib, PE, DVT  Allergies  Allergen Reactions   Daptomycin     Drug induced pneumonia    Diltiazem Other (See Comments)    Passed out   Ofev [Nintedanib] Diarrhea    fatigue   Pirfenidone Diarrhea    fatigue    Patient Measurements: Height: 5\' 9"  (175.3 cm) Weight: 90 kg (198 lb 6.6 oz) IBW/kg (Calculated) : 70.7    Vital Signs: Temp: 98.1 F (36.7 C) (01/20 0300) Temp Source: Oral (01/20 0300) BP: 142/65 (01/20 0800) Pulse Rate: 66 (01/20 0800)  Labs: Recent Labs    08/05/23 0237 08/06/23 0242 08/07/23 0231  HGB 10.3* 11.0* 12.6*  HCT 32.4* 33.8* 39.2  PLT 331 345 383  LABPROT 28.2* 19.2* 21.4*  INR 2.6* 1.6* 1.8*  CREATININE 0.42*  --  0.47*    Estimated Creatinine Clearance: 83 mL/min (A) (by C-G formula based on SCr of 0.47 mg/dL (L)).   Assessment: 78 yo M on warfarin PTA for Afib, hx PE & DVT. Pharmacy originally consulted to manage warfarin while admitted for PNA. Patient now unable to tolerate PO medications and growing stenotrophomonas maltophilia in respiratory cultures--IV Bactrim ordered (drug interaction noted with warfarin). Pharmacy now consulted for Lovenox treatment dosing.  Baseline INR mildly supratherapeutic Prior anticoagulation: warfarin 5mg  daily except 7.5mg  Tues/Sat - last dose taken 1/16 AM  Significant events: 1/17 resp cultures growing steno - IV Bactrim starting 1/20 (interaction with warfarin--may increase INR). Increasing oxygen requirement and lack of mentation make PO intake challenging >> switch from warfarin to Lovenox on 1/20.  Today, 08/07/2023: CBC: Hgb 12.6--stable and improved from yesterday. Plts 383--stable. INR continues to be SUBtherapeutic at 1.8, although slightly increased from 1.6 yesterday.  Given subtherapeutic INR and stable CBC, okay to start Lovenox therapy this morning. No bleeding issues reported  Goal of  Therapy: INR 2-3  Plan: Discontinue warfarin  Start Lovenox 90mg  [place] q12hrs (1mg /kg q12hrs) Daily CBC during Warfarin >> Lovenox transition (may transition to q72hr if CBC stable next couple of days) Monitor for signs of bleeding or thrombosis   Cherylin Mylar, PharmD Clinical Pharmacist  1/20/20259:04 AM

## 2023-08-07 NOTE — Progress Notes (Signed)
eLink Physician-Brief Progress Note Patient Name: AKARI KONKEL DOB: 06-29-1946 MRN: [id number]   Date of Service  08/07/2023  HPI/Events of Note  CCM consulted now for this case. DW Fayrene Fearing NP. Patient with ILD and COPD on home o2, on ceftriaxone and steroids, MRSA pCR neg, now with worsening hypoxia. Resp distress and severe tachycardia. Bedside had given rate control agents and some ativan. Continues to be severely restless on my camera assessment. HR 160. SBP 170. Not allowing CXR to be done due to agitation per bedside. RR in 40s. Is on a NRB and HHFNC . I clarified that he is a full code status.   eICU Interventions  Initiate precedex CXR stat NPO May need to be intubated for his work of breathing and hypoxia. Difficult since he has bad lungs at baseline. DW Dr Gaynell Face who is in [facility] and will evaluate urgently.      Intervention Category Major Interventions: Respiratory failure - evaluation and management Evaluation Type: New Patient Evaluation  Oretha Milch 08/07/2023, 12:35 AM

## 2023-08-07 NOTE — Progress Notes (Addendum)
Pharmacy Antibiotic Note  Luis Brown is a 79 y.o. male admitted on 08/03/2023 with sepsis secondary to pneumonia, RSV (+). Patient has been on ceftriaxone and doxycycline therapy since 1/16 for CAP treatment. Respiratory cultures are now growing staph aureus and stenotrophomonas maltophilia. Pharmacy has been consulted for IV Bactrim dosing.  Plan: Discontinue doxycycline Continue ceftriaxone 2g IV q24hrs Start Bactrim 320mg  (trimethoprim component) IV q8hrs Dose equates to ~10.7 mg/kg/day Monitor renal function, cultures/sensitivities, and overall clinical picture Monitor for drug interactions  Warfarin changed to Lovenox 1/20 AM given lack of PO intake and interaction with Bactrim (can increase INR)  Height: 5\' 9"  (175.3 cm) Weight: 90 kg (198 lb 6.6 oz) IBW/kg (Calculated) : 70.7  Temp (24hrs), Avg:98.2 F (36.8 C), Min:97.1 F (36.2 C), Max:98.8 F (37.1 C)  Recent Labs  Lab 08/03/23 1945 08/03/23 2200 08/04/23 0346 08/04/23 0804 08/04/23 1040 08/05/23 0237 08/06/23 0242 08/07/23 0231  WBC 14.4*  --  13.6*  --   --  10.9* 11.4* 12.6*  CREATININE 0.51*  --  0.49*  --   --  0.42*  --  0.47*  LATICACIDVEN  --  2.1* 2.3* 2.2* 1.6  --   --   --     Estimated Creatinine Clearance: 83 mL/min (A) (by C-G formula based on SCr of 0.47 mg/dL (L)).    Allergies  Allergen Reactions   Daptomycin     Drug induced pneumonia    Diltiazem Other (See Comments)    Passed out   Ofev [Nintedanib] Diarrhea    fatigue   Pirfenidone Diarrhea    fatigue    Antimicrobials this admission: 1/16 azithromycin x1 1/16 doxycycline >> 1/20 1/16 ceftriaxone >>  1/20 IV Bactrim >>  Dose adjustments this admission: N/A  Microbiology results: 1/16 BCx: NGTD 1/17 resp cx: RSV (+) 1/17 Sputum: staph aureus, steno maltophilia  1/17 MRSA PCR: negative   ADDENDUM: Sensitivities for respiratory cultures now showing steno is resistant to Bactrim, susceptible to levofloxacin. Also  showing pan-sensitive MSSA. After discussion with MD and ID pharmacist, will plan to transition from IV Bactrim to IV levofloxacin for steno coverage and transition from ceftriaxone to cefazolin for MSSA coverage. ID pharmacist and I discussed use of PO minocycline for further steno coverage, however, the patient is unable to take PO medications at this time. Will monitor him on IV levofloxacin monotherapy at this time. Plan: Discontinue IV Bactrim and ceftriaxone Start IV levofloxacin 750mg  daily  Start cefazolin 2g IV q8hrs  Monitor renal function and overall clinical picture    Thank you for allowing pharmacy to be a part of this patient's care.  Cherylin Mylar, PharmD Clinical Pharmacist  1/20/20259:23 AM

## 2023-08-07 NOTE — Progress Notes (Signed)
Cross covering ICU physician  Called to assess pt for increasing resp distress/agitation/confusion on hhfnc baseline pulmonary fibrosis.   Pt is unable to make decisions at this time, tangential thought process and oriented only to self at this time. He is in resp distress and pulling off his oxygen and desaturations.   Wife arrived at bedside and lengthy discussion regarding pt's clinical status and inability to tolerate hhfnc or NIV at this time well. She endorses that pt understands his baseline clinical disease and would never want intubation or CPR. She has requested medication to ease his wob and anxiety. Pt is on precedex at this time at 1 and we will add some morphine prn in order to facilitate easing wob. Daughter in en route. It is possible the pt may expire tonight and family is aware of this. However we will cont to ensure he is as comfortable as possible, cont to attempt to control afib with rvr/svt and diurese as BP tolerates for now.   Wife in agreement with plan, pt changed to DNR/DNI Added morphine Cont precedex Reattempt diltiazem, consider amio but pt is reportedly chronic afib BP remains stable if not Hypertensive Consider transition to full comfort should pt decline further   Formal consult to follow.

## 2023-08-07 NOTE — Consult Note (Signed)
NAME:  Luis Brown, MRN:  [id number], DOB:  [birth year], LOS: 4 ADMISSION DATE:  08/03/2023, CONSULTATION DATE:  08/07/23 REFERRING MD:  TRH, CHIEF COMPLAINT:  acute on chronic hypoxic resp failure   History of Present Illness:  78 yo male presented a day ago with worsening sob. Pt was found to be worsning hypoxia not tolerant of NRB or NIV so was placed on hhfnc which has been sufficient thus far. However pt's mentation continues to decline, he is oriented only to self at this time. Family is en route. I have attempted to discuss with him code status but his mentation is prohibitive of this.   Pt is currently agitated pulling at lines, pulling at oxygen tubing despite mittens bilaterally. Saying " I just want to get home"  Pertinent  Medical History  Eosinophilic penumonitis,  ILD Pulm fibrosis Copd -acute exacerbation,  Crhonic hypoxic resp failure Depression Anxiety Hyperlipidemia H/o pe with dvt -cont home meds as able but mentation and oxygen requirement make oral medications challenging.  -awaiting family to discuss code status as pt would likely be very challenging to liberate from ventilator -cxr pending -will give dose of diuresis with hypertension -increase precedex for less agitation/anxiety -otherwise cont with current therapy  UPDATE:  D/w wife pt would never want cpr or intubation. Pt made DNR/DNI   Significant Hospital Events: Including procedures, antibiotic start and stop dates in addition to other pertinent events   Admitted to hospital with rapid decline req ccm consultation for resp distress  Interim History / Subjective:    Objective   Blood pressure (!) 110/59, pulse 64, temperature 98.1 F (36.7 C), temperature source Oral, resp. rate (!) 21, height 5\' 9"  (1.753 m), weight 90 kg, SpO2 97%.    FiO2 (%):  [80 %-85 %] 85 %   Intake/Output Summary (Last 24 hours) at 08/07/2023 0448 Last data filed at 08/07/2023 0400 Gross per 24 hour  Intake 897.46 ml   Output 2995 ml  Net -2097.54 ml   Filed Weights   08/03/23 1857  Weight: 90 kg    Examination: General: in acute distress, accessory muscle use for respirations HENT: ncat, eomi, perrla, mm dry but pink  Lungs: diminished air movement diffusely  Cardiovascular: irreg irreg  Abdomen: protuberant, nt bs+ Extremities: + edema in all 4 extremities, no c/c Neuro: no focal deficits noted pt is strong and restless in all 4 extremities GU: deferred  Resolved Hospital Problem list     Assessment & Plan:  Acute on chronic hypoxic resp failure Pulm fibrosis Ild Acute metabolic encephalopathy Anxiety Metabolic derrangements including hyponatremia, hypok, hypoglcyemia, hypochloremia -after lengthy d/w wife and pt (although poor mentation)  -pt will be made DNR/DNI without plans for intubation -wife has requested daughter be present and she would like to provide some ease in his wob.  -we will add morphine for now, lose dose, for resp distress -once daughter arrives can cont with discussion.     Best Practice (right click and "Reselect all SmartList Selections" daily)   Diet/type: NPO DVT prophylaxis LMWH Pressure ulcer(s): N/A GI prophylaxis: H2B Lines: N/A Foley:  N/A Code Status:  DNR Last date of multidisciplinary goals of care discussion changed to dnr/dni after discussion with family regarding his acute on chronic condition]  Labs   CBC: Recent Labs  Lab 08/03/23 1945 08/04/23 0346 08/05/23 0237 08/06/23 0242 08/07/23 0231  WBC 14.4* 13.6* 10.9* 11.4* 12.6*  NEUTROABS 13.4*  --  9.9* 10.5* 11.6*  HGB 13.2 11.9*  10.3* 11.0* 12.6*  HCT 39.4 34.7* 32.4* 33.8* 39.2  MCV 85.5 86.3 88.3 89.9 87.3  PLT 346 338 331 345 383    Basic Metabolic Panel: Recent Labs  Lab 08/03/23 1945 08/03/23 2200 08/04/23 0346 08/04/23 0804 08/04/23 1040 08/05/23 0237 08/06/23 0242 08/07/23 0231  NA 121*   < > 120* 124* 123* 129*  --  133*  K 4.1  --  4.0  --   --  3.8  --   3.3*  CL 85*  --  87*  --   --  100  --  95*  CO2 23  --  22  --   --  22  --  26  GLUCOSE 327*  --  367*  --   --  147*  --  161*  BUN 17  --  16  --   --  16  --  25*  CREATININE 0.51*  --  0.49*  --   --  0.42*  --  0.47*  CALCIUM 8.7*  --  8.7*  --   --  8.4*  --  8.4*  MG  --   --   --   --   --  2.0 2.1 2.0   < > = values in this interval not displayed.   GFR: Estimated Creatinine Clearance: 83 mL/min (A) (by C-G formula based on SCr of 0.47 mg/dL (L)). Recent Labs  Lab 08/03/23 2200 08/04/23 0346 08/04/23 0804 08/04/23 1040 08/05/23 0237 08/06/23 0242 08/07/23 0231  PROCALCITON  --   --   --  6.65  --   --   --   WBC  --  13.6*  --   --  10.9* 11.4* 12.6*  LATICACIDVEN 2.1* 2.3* 2.2* 1.6  --   --   --     Liver Function Tests: Recent Labs  Lab 08/04/23 0346 08/05/23 0237  AST 26 22  ALT 18 14  ALKPHOS 67 56  BILITOT 1.1 0.5  PROT 6.0* 5.3*  ALBUMIN 3.4* 2.8*   No results for input(s): "LIPASE", "AMYLASE" in the last 168 hours. No results for input(s): "AMMONIA" in the last 168 hours.  ABG    Component Value Date/Time   PHART 7.44 08/06/2023 2114   PCO2ART 44 08/06/2023 2114   PO2ART 77 (L) 08/06/2023 2114   HCO3 29.9 (H) 08/06/2023 2114   TCO2 24 01/28/2007 1537   ACIDBASEDEF 0.9 07/17/2021 0428   O2SAT 95.5 08/06/2023 2114     Coagulation Profile: Recent Labs  Lab 08/03/23 1945 08/04/23 0346 08/05/23 0237 08/06/23 0242 08/07/23 0231  INR 3.3* 3.4* 2.6* 1.6* 1.8*    Cardiac Enzymes: No results for input(s): "CKTOTAL", "CKMB", "CKMBINDEX", "TROPONINI" in the last 168 hours.  HbA1C: Hgb A1c MFr Bld  Date/Time Value Ref Range Status  05/19/2023 01:35 PM 8.8 (H) 4.6 - 6.5 % Final    Comment:    Glycemic Control Guidelines for People with Diabetes:Non Diabetic:  <6%Goal of Therapy: <7%Additional Action Suggested:  >8%   12/22/2022 01:21 PM 10.5 (H) 4.6 - 6.5 % Final    Comment:    Glycemic Control Guidelines for People with Diabetes:Non  Diabetic:  <6%Goal of Therapy: <7%Additional Action Suggested:  >8%     CBG: Recent Labs  Lab 08/05/23 2210 08/06/23 0732 08/06/23 1118 08/06/23 1637 08/06/23 2203  GLUCAP 242* 157* 166* 152* 127*    Review of Systems:   Unobtainable 2/2 pt's encephalopathy   Past Medical History:  He,  has  a past medical history of Acute pharyngitis (10/21/2013), Allergy, Anxiety and depression (10/25/2011), BPH (benign prostatic hyperplasia) (04/23/2012), Chicken pox (as a child), DDD (degenerative disc disease), DDD (degenerative disc disease), lumbosacral, Diabetes mellitus, Dyspnea, ED (erectile dysfunction) (04/23/2012), Elevated BP, 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   Surgical History:   Past Surgical History:  Procedure Laterality Date   ATRIAL FIBRILLATION ABLATION N/A 01/27/2022   Procedure: ATRIAL FIBRILLATION ABLATION;  Surgeon: Regan Lemming, MD;  Location: [facility];  Service: Cardiovascular;  Laterality: N/A;   BACK SURGERY  2012 and 1994   Dr Charlesetta Garibaldi, screws and cage in low back   BIOPSY  01/09/2020   Procedure: BIOPSY;  Surgeon: Kathi Der, MD;  Location: [facility];  Service: Gastroenterology;;   BRONCHIAL BIOPSY  11/17/2020   Procedure: BRONCHIAL BIOPSIES;  Surgeon: Tomma Lightning, MD;  Location: [facility];  Service: Endoscopy;;   BRONCHIAL BRUSHINGS  11/17/2020   Procedure: BRONCHIAL BRUSHINGS;  Surgeon: Tomma Lightning, MD;  Location: [facility];  Service: Endoscopy;;   BRONCHIAL WASHINGS  11/17/2020   Procedure: BRONCHIAL WASHINGS;  Surgeon: Tomma Lightning, MD;  Location: [facility];   Service: Endoscopy;;   COLONOSCOPY WITH PROPOFOL N/A 01/09/2020   Procedure: COLONOSCOPY WITH PROPOFOL;  Surgeon: Kathi Der, MD;  Location: [facility];  Service: Gastroenterology;  Laterality: N/A;   ESOPHAGOGASTRODUODENOSCOPY (EGD) WITH PROPOFOL N/A 01/09/2020   Procedure: ESOPHAGOGASTRODUODENOSCOPY (EGD) WITH PROPOFOL;  Surgeon: Kathi Der, MD;  Location: [facility];  Service: Gastroenterology;  Laterality: N/A;   EYE SURGERY Bilateral    2016   HARDWARE REVISION  03/12/2019   REVISION OF HARDWARE THORACIC TEN-THORACIC ELEVEN WITH APPLICATION OF ADDITIONAL RODS AND ROD SLEEVES THORACIC EIGHT-THORACIC TWELVE 12-LUMBAR ONE (N/A )   HEMOSTASIS CONTROL  11/17/2020   Procedure: HEMOSTASIS CONTROL;  Surgeon: Tomma Lightning, MD;  Location: [facility];  Service: Endoscopy;;   IR US GUIDE BX ASP/DRAIN  11/13/2020   LAMINECTOMY  02/12/2019   DECOMPRESSIVE LAMINECTOMY THORACIC NINE-THORACIC TEN AND THORACIC TEN-THORACIC ELEVEN, EXTENSION OF THORACOLUMBAR FUSION FROM THORACIC TEN TO THORACIC FIVE, LOCAL BONE GRAFT, ALLOGRAFT AND VIVIGEN (N/A)   POLYPECTOMY  01/09/2020   Procedure: POLYPECTOMY;  Surgeon: Kathi Der, MD;  Location: [facility];  Service: Gastroenterology;;   REMOVAL OF RODS AND PEDICLE SCREWS T5 AND T6, EXPLORATION OF FUSION, BIOPSY TRANSPEDICULAR T5 AND T6 (N/A )  09/14/2020   SPINAL FUSION N/A 02/12/2019   Procedure: DECOMPRESSIVE LAMINECTOMY THORACIC NINE-THORACIC TEN AND THORACIC TEN-THORACIC ELEVEN, EXTENSION OF THORACOLUMBAR FUSION FROM THORACIC TEN TO THORACIC FIVE, LOCAL BONE GRAFT, ALLOGRAFT AND VIVIGEN;  Surgeon: Kerrin Champagne, MD;  Location: [facility];  Service: Orthopedics;  Laterality: N/A;  DECOMPRESSIVE LAMINECTOMY THORACIC NINE-THORACIC TEN AND THORACIC TEN-THORACIC ELEVEN, EXTENSION OF TH   SPINAL FUSION N/A 03/12/2019   Procedure: REVISION OF HARDWARE THORACIC TEN-THORACIC ELEVEN WITH APPLICATION OF ADDITIONAL RODS AND ROD SLEEVES THORACIC  EIGHT-THORACIC TWELVE 12-LUMBAR ONE;  Surgeon: Kerrin Champagne, MD;  Location: [facility];  Service: Orthopedics;  Laterality: N/A;   TONSILLECTOMY     as child   torn rotator cuff  2010   right   VIDEO BRONCHOSCOPY N/A 11/17/2020   Procedure: VIDEO BRONCHOSCOPY WITH FLUORO;  Surgeon: Tomma Lightning, MD;  Location: [facility];  Service: Endoscopy;  Laterality: N/A;     Social History:   reports that he quit smoking about 34 years ago. His smoking use included cigarettes. He started smoking about 54 years ago. He has a 20 pack-year smoking history. He has never used smokeless tobacco. He reports that he does not currently use alcohol. He reports that he does not use drugs.   Family History:  His family history includes COPD in his father; Cancer (age of onset: 55) in his mother; Diabetes in his mother; Emphysema in his father; Heart disease in his daughter, father, maternal grandfather, and paternal grandfather; Hyperlipidemia in his sister; Hypertension in his maternal grandmother, mother, and sister; Osteoporosis in his sister; Scoliosis in his maternal grandmother and sister; Stroke (age of onset: 38) in his father.   Allergies Allergies  Allergen Reactions   Daptomycin     Drug induced pneumonia    Diltiazem Other (See Comments)    Passed out   Ofev [Nintedanib] Diarrhea    fatigue   Pirfenidone Diarrhea    fatigue     Home Medications  Prior to Admission medications   Medication Sig Start Date End Date Taking? Authorizing Provider  albuterol (PROVENTIL HFA) 108 (90 Base) MCG/ACT inhaler TAKE 2 PUFFS BY MOUTH EVERY 6 HOURS AS NEEDED FOR WHEEZE OR SHORTNESS OF BREATH 03/16/23  Yes Kalman Shan, MD  busPIRone (BUSPAR) 15 MG tablet Take 1 tablet (15 mg total) by mouth 3 (three) times daily. Patient taking differently: Take 15 mg by mouth 3 (three) times daily as needed. 04/26/23  Yes Bradd Canary, MD  Cyanocobalamin (VITAMIN B-12 PO) Take by mouth.   Yes [provider]   dextromethorphan-guaiFENesin (MUCINEX DM) 30-600 MG 12hr tablet Take 1 tablet by mouth 2 (two) times daily.   Yes [provider]  fluticasone (FLONASE) 50 MCG/ACT nasal spray SPRAY 2 SPRAYS INTO EACH NOSTRIL EVERY DAY 05/02/22  Yes Saguier, Ramon Dredge, PA-C  Fluticasone-Umeclidin-Vilant (TRELEGY ELLIPTA) 200-62.5-25 MCG/ACT AEPB Inhale 1 puff into the lungs daily. 03/27/23  Yes Kalman Shan, MD  insulin glargine (LANTUS SOLOSTAR) 100 UNIT/ML Solostar Pen Inject 26 Units into the skin daily. 02/17/23  Yes Bradd Canary, MD  insulin lispro (HUMALOG) 100 UNIT/ML injection Inject 0.03 mLs (3 Units total) into the skin 3 (three) times daily with meals. Patient taking differently: Inject 3 Units into the skin 3 (three) times daily as needed for high blood sugar. 11/04/22  Yes Bradd Canary, MD  meclizine (ANTIVERT) 12.5 MG tablet Take 1 tablet (12.5 mg total) by mouth 3 (three) times daily as needed for dizziness. 04/13/23  Yes Bradd Canary, MD  methylPREDNISolone (MEDROL DOSEPAK) 4 MG TBPK tablet 6 tabs po x 1 day then 5 tabs po x 1 day then 4 tabs po x 1 day then 3 tabs po x 1 day then 2 tabs po x 1 day then 1 tab po x 1 day and stop 08/02/23  Yes Bradd Canary, MD  oxyCODONE-acetaminophen (PERCOCET/ROXICET) 5-325 MG tablet Take 1 tablet by mouth every 4 (four) hours as needed for pain- frequency change 08/17/22  Yes   pantoprazole (PROTONIX) 40 MG tablet TAKE 1 TABLET (40 MG TOTAL) BY MOUTH TWICE A DAY BEFORE MEALS Patient taking differently: Take 40 mg by mouth 2 (two) times daily as needed (indigestion). 08/02/23  Yes Bradd Canary, MD  predniSONE (DELTASONE) 5 MG tablet TAKE 1 TABLET (5 MG TOTAL) BY MOUTH DAILY. 07/24/23  Yes Kalman Shan, MD  pregabalin (LYRICA) 75 MG capsule  TAKE 1 CAPSULE BY MOUTH TWICE A DAY Patient taking differently: Take 75 mg by mouth 2 (two) times daily. 03/09/20  Yes Kerrin Champagne, MD  sitaGLIPtin (JANUVIA) 50 MG tablet Take 1 tablet (50 mg total) by mouth  daily. 07/04/23  Yes Bradd Canary, MD  tamsulosin (FLOMAX) 0.4 MG CAPS capsule TAKE 2 CAPSULES BY MOUTH EVERY DAY Patient taking differently: Take 0.8 mg by mouth every evening. 06/02/23  Yes Bradd Canary, MD  tirzepatide [facility]) 2.5 MG/0.5ML Pen Inject 2.5 mg into the skin once a week. 05/25/23  Yes Bradd Canary, MD  tiZANidine (ZANAFLEX) 4 MG tablet Take 1 and 1/2 tablets (6 mg total) by mouth every 4 hours. Patient taking differently: Take 4-6 mg by mouth every 6 (six) hours as needed for muscle spasms. 08/11/22  Yes   venlafaxine XR (EFFEXOR-XR) 150 MG 24 hr capsule Take 1 capsule (150 mg total) by mouth daily with breakfast. 06/06/23  Yes Bradd Canary, MD  warfarin (COUMADIN) 5 MG tablet TAKE 1 TO 2 TABLETS DAILY OR AS PRESCRIBED BY COUMADIN CLINIC Patient taking differently: Take 5-7.5 mg by mouth as directed. TAKE 1.5 TABLETS (7.5 MG) ON (TUESDAYS & SATURDAYS) & TAKE 1 TABLET (5 MG) ALL OTHER DAYS AS PRESCRIBED BY COUMADIN CLINIC 02/16/23  Yes Tobb, Kardie, DO  zolpidem (AMBIEN) 10 MG tablet Take 0.5-1 tablets (5-10 mg total) by mouth at bedtime as needed for sleep. Patient taking differently: Take 10 mg by mouth at bedtime. 08/03/23  Yes Worthy Rancher B, FNP  glucose blood (CONTOUR NEXT TEST) test strip Check blood sugar once daily 02/25/19   Saguier, Ramon Dredge, PA-C  Insulin Pen Needle (B-D UF III MINI PEN NEEDLES) 31G X 5 MM MISC USE WITH LANTUS 12/01/22   Bradd Canary, MD  Insulin Pen Needle 32G X 4 MM MISC Use with insulin 12/15/22   Bradd Canary, MD  Insulin Syringe-Needle U-100 (B-D INS SYR ULTRAFINE 1CC/30G) 30G X 1/2" 1 ML MISC USE WITH LANTUS 02/01/23   Bradd Canary, MD  Insulin Syringes, Disposable, U-100 1 ML MISC USE WITH LANTUS 12/02/22   Bradd Canary, MD  Pirfenidone 267 MG TABS Take 2 tablets (534 mg total) by mouth 3 (three) times daily with meals. Patient not taking: Reported on 08/03/2023 07/05/23   Kalman Shan, MD     Critical care time: 

## 2023-08-07 NOTE — Progress Notes (Signed)
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 He was tachycardic, tachypneic, and had leukocytosis on admission. CXR showed diffuse interstitial opacities.  Respiratory swabs were positive for RSV. He was started on steroids, nebs, and antibiotics.  Interval History:  Patient became more obtunded since yesterday with confusion, agitation, and worsening O2 requirements.  Sputum cultures have also updated with stenotrophomonas and Staph aureus. He was evaluated by critical care overnight as well. Code status was changed to DNR/DNI. He was also started on a Precedex drip due to the agitation and tachypnea.  He has been escalated to Optiflow. Wife present this morning and understands worsening prognosis. At this time we're continuing to remain as aggressive as possible medically in hopes he may improve, with understanding he may continue to decline. They do not wish for NGT/cortrak placement either.   Assessment and Plan: Sepsis secondary to pneumonia CAP RSV infection - cough, dyspnea, congestion; tachycardia, tachypnea, leukocytosis; suspected respiratory infection -RSV swab positive -CXR likely showing underlying findings of ILD but difficult to rule out superimposed pneumonia in setting of worsened respiratory symptoms and leukocytosis noted -PCT elevated.  Continue antibiotics - sputum culture updated with stenotrophomonas and Staph aureus; had already been on doxycycline.  Discontinue doxycycline and start Bactrim.  Continue Rocephin -Much more guarded prognosis today; wife understands;  has been evaluated by PCCM and now signed off as goals are clear and treatment plan in place -At this time medical measures are optimized and we will continue to monitor for improvement; if patient does continue to further decline, may need to shift to focusing on comfort   COPD exacerbation History of pulmonary fibrosis -Patient has history of pulmonary fibrosis, COPD and interstitial lung disease.  History of intolerance to Pirfenidone and currently at home patient is on Trelegy once daily and albuterol as needed. - exac suspected in setting of RSV, Staph, and steno - continue nebs and steroids  Sinus tach - sustained on 1/19; rate was around 150s to 160s. Patient asymptomatic. EKG noted with some ST depression likely tachy mediated and patient denied CP - did note respond to vagal maneuvers on 1/19 - ultimately treated with IV lopressor 5 mg x 2 doses then 10 mg IV cardizem bolus x 2 followed by PO Lopressor 50 mg - currently HR controlled - PRN lopressor for now; will adjust regimen as necessary  - etiology suspected in setting of severe infection and physical deconditioning    Euvolemic hyponatremia -Serum sodium 121.  Patient baseline sodium is around 128-132. - Hyponatremia likely in the setting of poor oral intake - s/p IVF - now improved - trend BMP   Paroxysmal atrial fibrillation - CHA2DS2-VASc score = 5, based on age, history of CHF and diabetes. -Had been on Coumadin -Now with n.p.o. status will transition to Lovenox   History of DVT and pulmonary embolism -Previously patient failed Xarelto and redeveloped DVT. - continue Lovenox - hold asa since NPO   Insulin dependent DM type II -A1c 8.8 checked in November 2024 - continue SSI and CBGs   Hyperlipidemia -Not on statin  BPH - holding flomax while NPO   Generalized anxiety disorder Insomnia - Effexor and Wellbutrin now on hold   Peripheral neuropathy - Lyrica and tizanidine now on hold   History of  dCHF -Hold off on fluids in setting of severe respiratory infection - Received a dose of Lasix on 08/07/2023   Old records reviewed in assessment of this patient  Antimicrobials: Rocephin 1/16 >> current Doxy 1/16 >> 1/20 Bactrim IV 1/20 >> current   DVT prophylaxis:  SCDs Start: 08/03/23 2118 Place TED hose Start: 08/03/23 2118   Code Status:   Code Status: Limited: Do not attempt resuscitation (DNR) -DNR-LIMITED -Do Not Intubate/DNI   Mobility Assessment (Last 72 Hours)     Mobility Assessment   No documentation.           Barriers to discharge: none Disposition Plan:  Home HH orders placed: TBD Status is: Inpt  Objective: Blood pressure 131/65, pulse 67, temperature 97.9 F (36.6 C), temperature source Axillary, resp. rate 19, height 5\' 9"  (1.753 m), weight 90 kg, SpO2 98%.  Examination:  Physical Exam Constitutional:      General: He is not in acute distress.    Comments: Sedated and minimally arousable in bed but appears comfortable; has optiflow in place  HENT:     Head: Normocephalic and atraumatic.     Mouth/Throat:     Mouth: Mucous membranes are moist.  Cardiovascular:     Rate and Rhythm: Normal rate and regular rhythm.  Pulmonary:     Effort: Pulmonary effort is normal. No respiratory distress.     Breath sounds: Wheezing present.     Comments: Diffuse coarse breath sounds bilaterally; severe Abdominal:     General: Bowel sounds are normal. There is no distension.     Palpations: Abdomen is soft.     Tenderness: There is no abdominal tenderness.  Musculoskeletal:        General: Normal range of motion.     Cervical back: Normal range of motion and neck supple.  Skin:    General: Skin is warm and dry.  Neurological:     Comments: Sedated and minimally responsive       Consultants:    Procedures:    Data Reviewed: Results for orders placed or performed during the hospital encounter of 08/03/23 (from the past 24 hours)  Glucose,  capillary     Status: Abnormal   Collection Time: 08/06/23  4:37 PM  Result Value Ref Range   Glucose-Capillary 152 (H) 70 - 99 mg/dL   Comment 1 Notify RN   Blood gas, arterial     Status: Abnormal   Collection Time: 08/06/23  9:14 PM  Result Value Ref Range   O2 Content 15.0 L/min   pH, Arterial 7.44 7.35 - 7.45   pCO2 arterial 44 32 - 48 mmHg   pO2, Arterial 77 (L) 83 - 108 mmHg   Bicarbonate 29.9 (H) 20.0 - 28.0 mmol/L   Acid-Base Excess 5.0 (H) 0.0 - 2.0 mmol/L   O2 Saturation 95.5 %   Patient temperature 37.1    Collection site RIGHT RADIAL    Drawn by [id number]    Allens test (pass/fail) PASS PASS  Glucose, capillary     Status: Abnormal   Collection Time: 08/06/23 10:03 PM  Result Value Ref Range   Glucose-Capillary 127 (H) 70 - 99 mg/dL   Comment 1 Notify RN    Comment 2 Document in Chart   Protime-INR     Status:  Abnormal   Collection Time: 08/07/23  2:31 AM  Result Value Ref Range   Prothrombin Time 21.4 (H) 11.4 - 15.2 seconds   INR 1.8 (H) 0.8 - 1.2  Magnesium     Status: None   Collection Time: 08/07/23  2:31 AM  Result Value Ref Range   Magnesium 2.0 1.7 - 2.4 mg/dL  CBC with Differential/Platelet     Status: Abnormal   Collection Time: 08/07/23  2:31 AM  Result Value Ref Range   WBC 12.6 (H) 4.0 - 10.5 K/uL   RBC 4.49 4.22 - 5.81 MIL/uL   Hemoglobin 12.6 (L) 13.0 - 17.0 g/dL   HCT 82.9 56.2 - 13.0 %   MCV 87.3 80.0 - 100.0 fL   MCH 28.1 26.0 - 34.0 pg   MCHC 32.1 30.0 - 36.0 g/dL   RDW 86.5 78.4 - 69.6 %   Platelets 383 150 - 400 K/uL   nRBC 0.0 0.0 - 0.2 %   Neutrophils Relative % 92 %   Neutro Abs 11.6 (H) 1.7 - 7.7 K/uL   Lymphocytes Relative 1 %   Lymphs Abs 0.1 (L) 0.7 - 4.0 K/uL   Monocytes Relative 7 %   Monocytes Absolute 0.8 0.1 - 1.0 K/uL   Eosinophils Relative 0 %   Eosinophils Absolute 0.0 0.0 - 0.5 K/uL   Basophils Relative 0 %   Basophils Absolute 0.0 0.0 - 0.1 K/uL   Immature Granulocytes 0 %   Abs Immature Granulocytes 0.05 0.00 -  0.07 K/uL  Basic metabolic panel     Status: Abnormal   Collection Time: 08/07/23  2:31 AM  Result Value Ref Range   Sodium 133 (L) 135 - 145 mmol/L   Potassium 3.3 (L) 3.5 - 5.1 mmol/L   Chloride 95 (L) 98 - 111 mmol/L   CO2 26 22 - 32 mmol/L   Glucose, Bld 161 (H) 70 - 99 mg/dL   BUN 25 (H) 8 - 23 mg/dL   Creatinine, Ser 2.95 (L) 0.61 - 1.24 mg/dL   Calcium 8.4 (L) 8.9 - 10.3 mg/dL   GFR, Estimated >28 >41 mL/min   Anion gap 12 5 - 15  Glucose, capillary     Status: Abnormal   Collection Time: 08/07/23  8:45 AM  Result Value Ref Range   Glucose-Capillary 202 (H) 70 - 99 mg/dL   Comment 1 Notify RN    Comment 2 Document in Chart    *Note: Due to a large number of results and/or encounters for the requested time period, some results have not been displayed. A complete set of results can be found in Results Review.    I have reviewed pertinent nursing notes, vitals, labs, and images as necessary. I have ordered labwork to follow up on as indicated.  I have reviewed the last notes from staff over past 24 hours. I have discussed patient's care plan and test results with nursing staff, CM/SW, and other staff as appropriate.  Critical care time to evaluate and treat this patient was 55 minutes.  Independent of separate billable services  This patient is critically ill with the following life-threatening issues requiring my presence at the bedside: Hemodynamic instability requiring titration of medications Oxygenation/ventilation instability requiring frequent modifications of support Cardiac rhythm disturbances requiring evaluation and/or interventions Fluctuations in neurologic function requiring evaluation and/or interventions and/or fluid/volume titration    LOS: 4 days   Lewie Chamber, MD [facility] 08/07/2023, 11:36 AM

## 2023-08-08 ENCOUNTER — Ambulatory Visit: Payer: Medicare Other | Admitting: Medical

## 2023-08-08 ENCOUNTER — Inpatient Hospital Stay (HOSPITAL_COMMUNITY): Payer: Medicare Other

## 2023-08-08 DIAGNOSIS — Z1624 Resistance to multiple antibiotics: Secondary | ICD-10-CM

## 2023-08-08 DIAGNOSIS — J441 Chronic obstructive pulmonary disease with (acute) exacerbation: Secondary | ICD-10-CM | POA: Diagnosis not present

## 2023-08-08 DIAGNOSIS — R0603 Acute respiratory distress: Secondary | ICD-10-CM | POA: Diagnosis not present

## 2023-08-08 DIAGNOSIS — J189 Pneumonia, unspecified organism: Secondary | ICD-10-CM | POA: Diagnosis not present

## 2023-08-08 DIAGNOSIS — A498 Other bacterial infections of unspecified site: Secondary | ICD-10-CM | POA: Diagnosis not present

## 2023-08-08 DIAGNOSIS — R000 Tachycardia, unspecified: Secondary | ICD-10-CM | POA: Diagnosis not present

## 2023-08-08 LAB — CULTURE, BLOOD (ROUTINE X 2)
Culture: NO GROWTH
Culture: NO GROWTH

## 2023-08-08 LAB — ECHOCARDIOGRAM COMPLETE
AR max vel: 4.21 cm2
AV Area VTI: 4.08 cm2
AV Area mean vel: 4 cm2
AV Mean grad: 5 mm[Hg]
AV Peak grad: 10.8 mm[Hg]
Ao pk vel: 1.64 m/s
Area-P 1/2: 6.83 cm2
Calc EF: 61.6 %
Height: 69 in
MV VTI: 5.72 cm2
S' Lateral: 2.8 cm
Single Plane A2C EF: 67.8 %
Single Plane A4C EF: 57.7 %
Weight: 3174.62 [oz_av]

## 2023-08-08 LAB — GLUCOSE, CAPILLARY
Glucose-Capillary: 260 mg/dL — ABNORMAL HIGH (ref 70–99)
Glucose-Capillary: 283 mg/dL — ABNORMAL HIGH (ref 70–99)
Glucose-Capillary: 221 mg/dL — ABNORMAL HIGH (ref 70–99)
Glucose-Capillary: 393 mg/dL — ABNORMAL HIGH (ref 70–99)
Glucose-Capillary: 273 mg/dL — ABNORMAL HIGH (ref 70–99)
Glucose-Capillary: 194 mg/dL — ABNORMAL HIGH (ref 70–99)

## 2023-08-08 LAB — BASIC METABOLIC PANEL
Anion gap: 9 (ref 5–15)
BUN: 25 mg/dL — ABNORMAL HIGH (ref 8–23)
CO2: 28 mmol/L (ref 22–32)
Calcium: 8.5 mg/dL — ABNORMAL LOW (ref 8.9–10.3)
Chloride: 98 mmol/L (ref 98–111)
Creatinine, Ser: 0.5 mg/dL — ABNORMAL LOW (ref 0.61–1.24)
GFR, Estimated: >60 mL/min (ref >60)
Glucose, Bld: 244 mg/dL — ABNORMAL HIGH (ref 70–99)
Potassium: 3.9 mmol/L (ref 3.5–5.1)
Sodium: 135 mmol/L (ref 135–145)

## 2023-08-08 LAB — CBC WITH DIFFERENTIAL/PLATELET
Abs Immature Granulocytes: 0.06 10*3/uL (ref 0.00–0.07)
Basophils Absolute: 0 10*3/uL (ref 0.0–0.1)
Basophils Relative: 0 %
Eosinophils Absolute: 0 10*3/uL (ref 0.0–0.5)
Eosinophils Relative: 0 %
HCT: 35.9 % — ABNORMAL LOW (ref 39.0–52.0)
Hemoglobin: 11.9 g/dL — ABNORMAL LOW (ref 13.0–17.0)
Immature Granulocytes: 1 %
Lymphocytes Relative: 1 %
Lymphs Abs: 0.1 10*3/uL — ABNORMAL LOW (ref 0.7–4.0)
MCH: 28.8 pg (ref 26.0–34.0)
MCHC: 33.1 g/dL (ref 30.0–36.0)
MCV: 86.9 fL (ref 80.0–100.0)
Monocytes Absolute: 0.6 10*3/uL (ref 0.1–1.0)
Monocytes Relative: 6 %
Neutro Abs: 9.3 10*3/uL — ABNORMAL HIGH (ref 1.7–7.7)
Neutrophils Relative %: 92 %
Platelets: 332 10*3/uL (ref 150–400)
RBC: 4.13 MIL/uL — ABNORMAL LOW (ref 4.22–5.81)
RDW: 14.8 % (ref 11.5–15.5)
WBC: 10.1 10*3/uL (ref 4.0–10.5)
nRBC: 0 % (ref 0.0–0.2)

## 2023-08-08 LAB — PROTIME-INR
INR: 2.9 — ABNORMAL HIGH (ref 0.8–1.2)
Prothrombin Time: 30.8 s — ABNORMAL HIGH (ref 11.4–15.2)

## 2023-08-08 LAB — MAGNESIUM: Magnesium: 2.2 mg/dL (ref 1.7–2.4)

## 2023-08-08 MED ORDER — BUSPIRONE HCL 5 MG PO TABS
15.0000 mg | ORAL_TABLET | Freq: Three times a day (TID) | ORAL | Status: DC
  Administered 2023-08-08 – 2023-08-20 (×37): 15 mg via ORAL
  Filled 2023-08-08 (×37): qty 1

## 2023-08-08 MED ORDER — PREGABALIN 75 MG PO CAPS
75.0000 mg | ORAL_CAPSULE | Freq: Two times a day (BID) | ORAL | Status: DC
  Administered 2023-08-08 – 2023-08-20 (×25): 75 mg via ORAL
  Filled 2023-08-08 (×25): qty 1

## 2023-08-08 MED ORDER — VENLAFAXINE HCL ER 150 MG PO CP24
150.0000 mg | ORAL_CAPSULE | Freq: Every day | ORAL | Status: DC
  Administered 2023-08-08 – 2023-08-20 (×13): 150 mg via ORAL
  Filled 2023-08-08 (×13): qty 1

## 2023-08-08 MED ORDER — DM-GUAIFENESIN ER 30-600 MG PO TB12
2.0000 | ORAL_TABLET | Freq: Two times a day (BID) | ORAL | Status: DC
  Administered 2023-08-08 – 2023-08-20 (×24): 2 via ORAL
  Filled 2023-08-08 (×13): qty 2
  Filled 2023-08-08: qty 1
  Filled 2023-08-08 (×10): qty 2

## 2023-08-08 MED ORDER — FUROSEMIDE 10 MG/ML IJ SOLN
40.0000 mg | Freq: Once | INTRAMUSCULAR | Status: AC
  Administered 2023-08-08: 40 mg via INTRAVENOUS
  Filled 2023-08-08: qty 4

## 2023-08-08 MED ORDER — ARFORMOTEROL TARTRATE 15 MCG/2ML IN NEBU
15.0000 ug | INHALATION_SOLUTION | Freq: Two times a day (BID) | RESPIRATORY_TRACT | Status: DC
  Administered 2023-08-08 – 2023-08-20 (×25): 15 ug via RESPIRATORY_TRACT
  Filled 2023-08-08 (×25): qty 2

## 2023-08-08 MED ORDER — BUDESONIDE 0.5 MG/2ML IN SUSP
0.5000 mg | Freq: Two times a day (BID) | RESPIRATORY_TRACT | Status: DC
  Administered 2023-08-08 – 2023-08-20 (×25): 0.5 mg via RESPIRATORY_TRACT
  Filled 2023-08-08 (×25): qty 2

## 2023-08-08 MED ORDER — METHYLPREDNISOLONE SODIUM SUCC 40 MG IJ SOLR
40.0000 mg | Freq: Every day | INTRAMUSCULAR | Status: DC
  Administered 2023-08-09 – 2023-08-11 (×3): 40 mg via INTRAVENOUS
  Filled 2023-08-08 (×3): qty 1

## 2023-08-08 MED ORDER — TAMSULOSIN HCL 0.4 MG PO CAPS
0.8000 mg | ORAL_CAPSULE | Freq: Every evening | ORAL | Status: DC
  Administered 2023-08-08 – 2023-08-18 (×11): 0.8 mg via ORAL
  Filled 2023-08-08 (×11): qty 2

## 2023-08-08 MED ORDER — LOSARTAN POTASSIUM 50 MG PO TABS
50.0000 mg | ORAL_TABLET | Freq: Every day | ORAL | Status: DC
  Administered 2023-08-08 – 2023-08-10 (×3): 50 mg via ORAL
  Filled 2023-08-08 (×3): qty 1

## 2023-08-08 MED ORDER — OXYCODONE-ACETAMINOPHEN 5-325 MG PO TABS
1.0000 | ORAL_TABLET | ORAL | Status: DC | PRN
  Administered 2023-08-08 – 2023-08-10 (×12): 1 via ORAL
  Filled 2023-08-08 (×13): qty 1

## 2023-08-08 MED ORDER — MINOCYCLINE HCL 50 MG PO CAPS
200.0000 mg | ORAL_CAPSULE | Freq: Two times a day (BID) | ORAL | Status: AC
  Administered 2023-08-08 – 2023-08-14 (×14): 200 mg via ORAL
  Filled 2023-08-08 (×15): qty 4

## 2023-08-08 MED ORDER — POTASSIUM CHLORIDE CRYS ER 20 MEQ PO TBCR
40.0000 meq | EXTENDED_RELEASE_TABLET | Freq: Once | ORAL | Status: AC
  Administered 2023-08-08: 40 meq via ORAL
  Filled 2023-08-08: qty 2

## 2023-08-08 MED ORDER — REVEFENACIN 175 MCG/3ML IN SOLN
175.0000 ug | Freq: Every day | RESPIRATORY_TRACT | Status: DC
Start: 2023-08-08 — End: 2023-08-20
  Administered 2023-08-08 – 2023-08-20 (×12): 175 ug via RESPIRATORY_TRACT
  Filled 2023-08-08 (×13): qty 3

## 2023-08-08 MED ORDER — HYDRALAZINE HCL 20 MG/ML IJ SOLN
10.0000 mg | INTRAMUSCULAR | Status: DC | PRN
Start: 2023-08-08 — End: 2023-08-11
  Administered 2023-08-08 – 2023-08-09 (×4): 10 mg via INTRAVENOUS
  Filled 2023-08-08 (×4): qty 1

## 2023-08-08 MED ORDER — INSULIN GLARGINE-YFGN 100 UNIT/ML ~~LOC~~ SOLN
10.0000 [IU] | Freq: Every day | SUBCUTANEOUS | Status: DC
Start: 2023-08-08 — End: 2023-08-14
  Administered 2023-08-08 – 2023-08-13 (×6): 10 [IU] via SUBCUTANEOUS
  Filled 2023-08-08 (×7): qty 0.1

## 2023-08-08 MED ORDER — LABETALOL HCL 5 MG/ML IV SOLN
10.0000 mg | Freq: Once | INTRAVENOUS | Status: AC
  Administered 2023-08-08: 10 mg via INTRAVENOUS

## 2023-08-08 NOTE — Progress Notes (Signed)
*  PRELIMINARY RESULTS* Echocardiogram 2D Echocardiogram has been performed.  Ocie Doyne RDCS 08/08/2023, 2:57 PM

## 2023-08-08 NOTE — Inpatient Diabetes Management (Signed)
Inpatient Diabetes Program Recommendations  AACE/ADA: New Consensus Statement on Inpatient Glycemic Control   Target Ranges:  Prepandial:   less than 140 mg/dL      Peak postprandial:   less than 180 mg/dL (1-2 hours)      Critically ill patients:  140 - 180 mg/dL    Latest Reference Range & Units 08/08/23 03:44 08/08/23 07:28 08/08/23 12:04  Glucose-Capillary 70 - 99 mg/dL 161 (H) 096 (H) 045 (H)    Latest Reference Range & Units 08/07/23 08:45 08/07/23 11:37 08/07/23 15:56 08/07/23 20:25 08/07/23 23:47  Glucose-Capillary 70 - 99 mg/dL 409 (H) 811 (H) 914 (H) 281 (H) 233 (H)   Review of Glycemic Control  Diabetes history: DM2  Outpatient Diabetes medications: Lantus 26 units daily, Humalog 3 units TID with meals Current orders for Inpatient glycemic control: Semglee 10 units daily, Novolog 0-15 units Q4H; Solumedrol 40 mg daily  Inpatient Diabetes Program Recommendations:    Insulin: Noted steroids decreased and Semglee 10 units daily today. If steroids are continued, please consider ordering Novolog 4 units TID with meals for meal coverage if patient eats at least 50% of meals.  Thanks, Orlando Penner, RN, MSN, CDCES Diabetes Coordinator Inpatient Diabetes Program [phone] (Team Pager from 8am to 5pm)

## 2023-08-08 NOTE — Plan of Care (Signed)
  Problem: Coping: Goal: Ability to adjust to condition or change in health will improve Outcome: Progressing   Problem: Clinical Measurements: Goal: Ability to maintain clinical measurements within normal limits will improve Outcome: Progressing   Problem: Activity: Goal: Risk for activity intolerance will decrease Outcome: Progressing   Problem: Elimination: Goal: Will not experience complications related to bowel motility Outcome: Progressing

## 2023-08-08 NOTE — Progress Notes (Signed)
Progress Note    Luis Brown   OZH:[id number]  DOB: 1945-11-20  DOA: 08/03/2023     5 PCP: Bradd Canary, MD  Initial CC: SOB, cough  Hospital Course: Luis Brown is a 78 yo male with PMH eosinophilic pneumonitis, ILD, pulmonary fibrosis, COPD, chronic hypoxia on 2 L O2 at home, PAF, history of PE and DVT on Coumadin, GERD, malnutrition, HLD, anxiety/depression, PMR who presented with cough, congestion, and worsening shortness of breath. He was tachycardic, tachypneic, and had leukocytosis on admission. CXR showed diffuse interstitial opacities.  Respiratory swabs were positive for RSV. He was started on steroids, nebs, and antibiotics.  Interval History:  Significant improvement since yesterday.  He is now awake and alert and speaking clearly this morning.  Mild agitation but overall comfortable.  Will try to wean Precedex off today.  Wife present bedside this morning and updated as well. Resuming home oral medications since he is now awake again.  Main goal is to continue weaning oxygen as able; remains on Optiflow.  Continue nutrition as able.  Assessment and Plan: Sepsis secondary to pneumonia CAP (Stenotrophomonas and MSSA) RSV infection Acute hypoxic respiratory failure  - cough, dyspnea, congestion; tachycardia, tachypnea, leukocytosis; suspected respiratory infection -RSV swab positive -CXR likely showing underlying findings of ILD but difficult to rule out superimposed pneumonia in setting of worsened respiratory symptoms and leukocytosis noted -PCT elevated.  Continue antibiotics - sputum culture updated with stenotrophomonas and Staph aureus; had already been on doxycycline and Rocephin - modified abx on 1/20 to Levaquin; should cover Steno and MSSA; still on Ancef, I'm okay continuing for now; minocycline added 1/21 for added atypical coverage  - prognosis still somewhat guarded but he's improved as of 1/21; will continue to remain aggressive   COPD  exacerbation History of pulmonary fibrosis -Patient has history of pulmonary fibrosis, COPD and interstitial lung disease.  History of intolerance to Pirfenidone and currently at home patient is on Trelegy once daily and albuterol as needed. - exac suspected in setting of RSV, Staph, and steno - continue nebs and steroids  Sinus tach - sustained on 1/19; rate was around 150s to 160s. Patient asymptomatic. EKG noted with some ST depression likely tachy mediated and patient denied CP - did note respond to vagal maneuvers on 1/19 - ultimately treated with IV lopressor 5 mg x 2 doses then 10 mg IV cardizem bolus x 2 followed by PO Lopressor 50 mg - currently HR controlled - PRN lopressor for now; will adjust regimen as necessary  - etiology suspected in setting of severe infection and physical deconditioning  - can add back PO lopressor if needed   Euvolemic hyponatremia -Serum sodium 121.  Patient baseline sodium is around 128-132. - Hyponatremia likely in the setting of poor oral intake - s/p IVF - now improved - trend BMP   Paroxysmal atrial fibrillation - CHA2DS2-VASc score = 5, based on age, history of CHF and diabetes. -Had been on Coumadin - placed on Lovenox on 1/20 when obtunded; if maintains mentation and PO intake can change back to Coumadin on 1/22   History of DVT and pulmonary embolism -Previously patient failed Xarelto and redeveloped DVT. - continue Lovenox; see PAF - hold asa for now   Insulin dependent DM type II -A1c 8.8 checked in November 2024 - continue SSI and CBGs   Hyperlipidemia -Not on statin   BPH - resume flomax   Generalized anxiety disorder Insomnia - Effexor and Wellbutrin   Peripheral neuropathy -  Lyrica and tizanidine   History of dCHF -Hold off on fluids in setting of severe respiratory infection - Received a dose of Lasix on 08/07/2023   Old records reviewed in assessment of this patient  Antimicrobials: Rocephin 1/16 >>  1/19 Doxy 1/16 >> 1/20 Ancef 1/20 >> current  Levaquin 1/20 >> current Minocycline 1/21 >> current   DVT prophylaxis:  SCDs Start: 08/03/23 2118 Place TED hose Start: 08/03/23 2118   Code Status:   Code Status: Limited: Do not attempt resuscitation (DNR) -DNR-LIMITED -Do Not Intubate/DNI   Mobility Assessment (Last 72 Hours)     Mobility Assessment   No documentation.           Barriers to discharge: none Disposition Plan:  Home HH orders placed: TBD Status is: Inpt  Objective: Blood pressure (!) 183/69, pulse 67, temperature 98.3 F (36.8 C), temperature source Oral, resp. rate (!) 24, height 5\' 9"  (1.753 m), weight 90 kg, SpO2 96%.  Examination:  Physical Exam Constitutional:      General: He is not in acute distress.    Comments: Awake and alert again.  Does not remember much of yesterday.  Optiflow in place  HENT:     Head: Normocephalic and atraumatic.     Mouth/Throat:     Mouth: Mucous membranes are moist.  Cardiovascular:     Rate and Rhythm: Normal rate and regular rhythm.  Pulmonary:     Effort: Pulmonary effort is normal. No respiratory distress.     Breath sounds: Wheezing present.     Comments: Diffuse coarse breath sounds bilaterally; severe Abdominal:     General: Bowel sounds are normal. There is no distension.     Palpations: Abdomen is soft.     Tenderness: There is no abdominal tenderness.  Musculoskeletal:        General: Normal range of motion.     Cervical back: Normal range of motion and neck supple.  Skin:    General: Skin is warm and dry.  Neurological:     General: No focal deficit present.  Psychiatric:        Mood and Affect: Mood normal.      Consultants:    Procedures:    Data Reviewed: Results for orders placed or performed during the hospital encounter of 08/03/23 (from the past 24 hours)  Glucose, capillary     Status: Abnormal   Collection Time: 08/07/23 11:37 AM  Result Value Ref Range   Glucose-Capillary 202  (H) 70 - 99 mg/dL  Glucose, capillary     Status: Abnormal   Collection Time: 08/07/23  3:56 PM  Result Value Ref Range   Glucose-Capillary 202 (H) 70 - 99 mg/dL  Glucose, capillary     Status: Abnormal   Collection Time: 08/07/23  8:25 PM  Result Value Ref Range   Glucose-Capillary 281 (H) 70 - 99 mg/dL  Glucose, capillary     Status: Abnormal   Collection Time: 08/07/23 11:47 PM  Result Value Ref Range   Glucose-Capillary 233 (H) 70 - 99 mg/dL   Comment 1 Notify RN    Comment 2 Document in Chart   Protime-INR     Status: Abnormal   Collection Time: 08/08/23  3:06 AM  Result Value Ref Range   Prothrombin Time 30.8 (H) 11.4 - 15.2 seconds   INR 2.9 (H) 0.8 - 1.2  Magnesium     Status: None   Collection Time: 08/08/23  3:06 AM  Result Value Ref Range  Magnesium 2.2 1.7 - 2.4 mg/dL  CBC with Differential/Platelet     Status: Abnormal   Collection Time: 08/08/23  3:06 AM  Result Value Ref Range   WBC 10.1 4.0 - 10.5 K/uL   RBC 4.13 (L) 4.22 - 5.81 MIL/uL   Hemoglobin 11.9 (L) 13.0 - 17.0 g/dL   HCT 82.9 (L) 56.2 - 13.0 %   MCV 86.9 80.0 - 100.0 fL   MCH 28.8 26.0 - 34.0 pg   MCHC 33.1 30.0 - 36.0 g/dL   RDW 86.5 78.4 - 69.6 %   Platelets 332 150 - 400 K/uL   nRBC 0.0 0.0 - 0.2 %   Neutrophils Relative % 92 %   Neutro Abs 9.3 (H) 1.7 - 7.7 K/uL   Lymphocytes Relative 1 %   Lymphs Abs 0.1 (L) 0.7 - 4.0 K/uL   Monocytes Relative 6 %   Monocytes Absolute 0.6 0.1 - 1.0 K/uL   Eosinophils Relative 0 %   Eosinophils Absolute 0.0 0.0 - 0.5 K/uL   Basophils Relative 0 %   Basophils Absolute 0.0 0.0 - 0.1 K/uL   Immature Granulocytes 1 %   Abs Immature Granulocytes 0.06 0.00 - 0.07 K/uL  Basic metabolic panel     Status: Abnormal   Collection Time: 08/08/23  3:06 AM  Result Value Ref Range   Sodium 135 135 - 145 mmol/L   Potassium 3.9 3.5 - 5.1 mmol/L   Chloride 98 98 - 111 mmol/L   CO2 28 22 - 32 mmol/L   Glucose, Bld 244 (H) 70 - 99 mg/dL   BUN 25 (H) 8 - 23 mg/dL    Creatinine, Ser 2.95 (L) 0.61 - 1.24 mg/dL   Calcium 8.5 (L) 8.9 - 10.3 mg/dL   GFR, Estimated >28 >41 mL/min   Anion gap 9 5 - 15  Glucose, capillary     Status: Abnormal   Collection Time: 08/08/23  3:44 AM  Result Value Ref Range   Glucose-Capillary 260 (H) 70 - 99 mg/dL   Comment 1 Notify RN    Comment 2 Document in Chart   Glucose, capillary     Status: Abnormal   Collection Time: 08/08/23  7:28 AM  Result Value Ref Range   Glucose-Capillary 283 (H) 70 - 99 mg/dL   Comment 1 Notify RN    Comment 2 Document in Chart    *Note: Due to a large number of results and/or encounters for the requested time period, some results have not been displayed. A complete set of results can be found in Results Review.    I have reviewed pertinent nursing notes, vitals, labs, and images as necessary. I have ordered labwork to follow up on as indicated.  I have reviewed the last notes from staff over past 24 hours. I have discussed patient's care plan and test results with nursing staff, CM/SW, and other staff as appropriate.  Critical care time to evaluate and treat this patient was 55 minutes.  Independent of separate billable services  This patient is critically ill with the following life-threatening issues requiring my presence at the bedside: Hemodynamic instability requiring titration of medications Oxygenation/ventilation instability requiring frequent modifications of support Cardiac rhythm disturbances requiring evaluation and/or interventions Fluctuations in neurologic function requiring evaluation and/or interventions and/or fluid/volume titration    LOS: 5 days   Lewie Chamber, MD [facility] 08/08/2023, 10:50 AM

## 2023-08-08 NOTE — Progress Notes (Signed)
NAME:  Luis Brown, MRN:  [id number], DOB:  [birth year], LOS: 5 ADMISSION DATE:  08/03/2023, CONSULTATION DATE:  08/07/23 REFERRING MD:  TRH, CHIEF COMPLAINT:  acute on chronic hypoxic resp failure   History of Present Illness:  78 yo male presented a day ago with worsening sob. Pt was found to be worsning hypoxia not tolerant of NRB or NIV so was placed on hhfnc which has been sufficient thus far. However pt's mentation continues to decline, he is oriented only to self at this time. Family is en route. I have attempted to discuss with him code status but his mentation is prohibitive of this.   Pt is currently agitated pulling at lines, pulling at oxygen tubing despite mittens bilaterally. Saying " I just want to get home"  Pertinent  Medical History  Eosinophilic penumonitis,  ILD Pulm fibrosis Copd -acute exacerbation,  Crhonic hypoxic resp failure Depression Anxiety Hyperlipidemia H/o pe with dvt -cont home meds as able but mentation and oxygen requirement make oral medications challenging.  -awaiting family to discuss code status as pt would likely be very challenging to liberate from ventilator -cxr pending -will give dose of diuresis with hypertension -increase precedex for less agitation/anxiety -otherwise cont with current therapy  UPDATE:  D/w wife pt would never want cpr or intubation. Pt made DNR/DNI   Significant Hospital Events: Including procedures, antibiotic start and stop dates in addition to other pertinent events   Admitted to hospital 1/16  1/20 with rapid decline req ccm consultation for resp distress. Added treatment for stenotrophamonas. Made DNR 1/21 better post adding rx for steno   Interim History / Subjective:  Feels better   Objective   Blood pressure (!) 143/49, pulse 61, temperature 98.3 F (36.8 C), temperature source Oral, resp. rate (!) 24, height 5\' 9"  (1.753 m), weight 90 kg, SpO2 97%.    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 COPD RSV pna Staph pna Stenotrophamonas pna Volume overload  Acute metabolic encephalopathy Hyperglycemia  Anemia  Pulm/ccm prob list Acute on chronic hypoxic resp failure secondary to RSV viral PNA complicated further by secondary staph and stenotrophamonas PNA superimposed on Pulm fibrosis/ILD and COPD -had been treated for staph, started bactrim 1/20 then levaquin as the stenotrophamonas was resistant to bactrim 1/21 Clinically he is better Plan Cont IV ancef for staph (would treat 7d) Day 1 of 14 levaquin and minocyline for stenotrophamonas  Cont to wean O2 as tolerated  Lasix today  Add scheduled BDs on Trelegy ellipta at home [place] today  Mobilize as able  He is day 5 high dose steroids, change to 40mg /d   Acute metabolic encephalopathy Anxiety -improved Plan Dc dex     Best Practice (right click and "Reselect all SmartList Selections" daily)   Diet/type: Regular consistency (see orders) DVT prophylaxis LMWH  Pressure ulcer(s): N/A GI prophylaxis: H2B Lines: N/A Foley:  N/A Code Status:  DNR Last date of multidisciplinary goals of care discussion changed to dnr/dni after discussion with family regarding his acute on chronic condition]  Critical care time: NA

## 2023-08-09 DIAGNOSIS — J441 Chronic obstructive pulmonary disease with (acute) exacerbation: Secondary | ICD-10-CM | POA: Diagnosis not present

## 2023-08-09 DIAGNOSIS — J189 Pneumonia, unspecified organism: Secondary | ICD-10-CM | POA: Diagnosis not present

## 2023-08-09 DIAGNOSIS — A498 Other bacterial infections of unspecified site: Secondary | ICD-10-CM | POA: Diagnosis not present

## 2023-08-09 DIAGNOSIS — R000 Tachycardia, unspecified: Secondary | ICD-10-CM | POA: Diagnosis not present

## 2023-08-09 LAB — GLUCOSE, CAPILLARY
Glucose-Capillary: 127 mg/dL — ABNORMAL HIGH (ref 70–99)
Glucose-Capillary: 136 mg/dL — ABNORMAL HIGH (ref 70–99)
Glucose-Capillary: 300 mg/dL — ABNORMAL HIGH (ref 70–99)
Glucose-Capillary: 345 mg/dL — ABNORMAL HIGH (ref 70–99)
Glucose-Capillary: 404 mg/dL — ABNORMAL HIGH (ref 70–99)

## 2023-08-09 LAB — BASIC METABOLIC PANEL
Anion gap: 12 (ref 5–15)
BUN: 28 mg/dL — ABNORMAL HIGH (ref 8–23)
CO2: 27 mmol/L (ref 22–32)
Calcium: 8.3 mg/dL — ABNORMAL LOW (ref 8.9–10.3)
Chloride: 96 mmol/L — ABNORMAL LOW (ref 98–111)
Creatinine, Ser: 0.43 mg/dL — ABNORMAL LOW (ref 0.61–1.24)
GFR, Estimated: >60 mL/min (ref >60)
Glucose, Bld: 123 mg/dL — ABNORMAL HIGH (ref 70–99)
Potassium: 3.5 mmol/L (ref 3.5–5.1)
Sodium: 135 mmol/L (ref 135–145)

## 2023-08-09 LAB — PROTIME-INR
INR: 2.4 — ABNORMAL HIGH (ref 0.8–1.2)
Prothrombin Time: 26.1 s — ABNORMAL HIGH (ref 11.4–15.2)

## 2023-08-09 LAB — CBC WITH DIFFERENTIAL/PLATELET
Abs Immature Granulocytes: 0.32 10*3/uL — ABNORMAL HIGH (ref 0.00–0.07)
Basophils Absolute: 0.1 10*3/uL (ref 0.0–0.1)
Basophils Relative: 0 %
Eosinophils Absolute: 0 10*3/uL (ref 0.0–0.5)
Eosinophils Relative: 0 %
HCT: 40.5 % (ref 39.0–52.0)
Hemoglobin: 12.9 g/dL — ABNORMAL LOW (ref 13.0–17.0)
Immature Granulocytes: 2 %
Lymphocytes Relative: 2 %
Lymphs Abs: 0.2 10*3/uL — ABNORMAL LOW (ref 0.7–4.0)
MCH: 29 pg (ref 26.0–34.0)
MCHC: 31.9 g/dL (ref 30.0–36.0)
MCV: 91 fL (ref 80.0–100.0)
Monocytes Absolute: 0.9 10*3/uL (ref 0.1–1.0)
Monocytes Relative: 6 %
Neutro Abs: 13.7 10*3/uL — ABNORMAL HIGH (ref 1.7–7.7)
Neutrophils Relative %: 90 %
Platelets: 459 10*3/uL — ABNORMAL HIGH (ref 150–400)
RBC: 4.45 MIL/uL (ref 4.22–5.81)
RDW: 15.2 % (ref 11.5–15.5)
WBC: 15.3 10*3/uL — ABNORMAL HIGH (ref 4.0–10.5)
nRBC: 0 % (ref 0.0–0.2)

## 2023-08-09 LAB — MAGNESIUM: Magnesium: 2 mg/dL (ref 1.7–2.4)

## 2023-08-09 MED ORDER — INSULIN ASPART 100 UNIT/ML IJ SOLN: 6.0000 [IU] | Freq: Once | INTRAMUSCULAR | Status: DC

## 2023-08-09 MED ORDER — INSULIN ASPART 100 UNIT/ML IJ SOLN
16.0000 [IU] | Freq: Once | INTRAMUSCULAR | Status: AC
  Administered 2023-08-09: 16 [IU] via SUBCUTANEOUS

## 2023-08-09 NOTE — Plan of Care (Signed)
  Problem: Skin Integrity: Goal: Risk for impaired skin integrity will decrease Outcome: Progressing   Problem: Tissue Perfusion: Goal: Adequacy of tissue perfusion will improve Outcome: Progressing   

## 2023-08-09 NOTE — Inpatient Diabetes Management (Signed)
Inpatient Diabetes Program Recommendations  AACE/ADA: New Consensus Statement on Inpatient Glycemic Control   Target Ranges:  Prepandial:   less than 140 mg/dL      Peak postprandial:   less than 180 mg/dL (1-2 hours)      Critically ill patients:  140 - 180 mg/dL    Latest Reference Range & Units 08/08/23 07:28 08/08/23 12:04 08/08/23 15:32 08/08/23 19:28 08/08/23 23:04 08/09/23 04:17 08/09/23 08:15  Glucose-Capillary 70 - 99 mg/dL 098 (H) 119 (H) 147 (H) 273 (H) 194 (H) 127 (H) 136 (H)   Review of Glycemic Control  Diabetes history: DM2  Outpatient Diabetes medications: Lantus 26 units daily, Humalog 3 units TID with meals Current orders for Inpatient glycemic control: Semglee 10 units daily, Novolog 0-15 units Q4H; Solumedrol 40 mg daily   Inpatient Diabetes Program Recommendations:     Insulin:  If steroids are continued, please consider ordering Novolog 4 units TID with meals for meal coverage if patient eats at least 50% of meals.   Thanks, Orlando Penner, RN, MSN, CDCES Diabetes Coordinator Inpatient Diabetes Program [phone] (Team Pager from 8am to 5pm)

## 2023-08-09 NOTE — Progress Notes (Signed)
Patient seen, independently examined, care plan was formulated and discussed with Zenia Resides as per documentation  Patient with a history of eosinophilic pneumonitis, ILD, pulmonary fibrosis, being treated for stenotrophomonas, RSV  Appears to be stabilizing, does complain of a headache  Transition to DNR status  Elderly, moist oral mucosa  does have rhonchi/rales bilaterally S1-S2 appreciated Bowel sounds appreciated Extremities warm and dry  Assessment/plan Acute on chronic hypoxemic respiratory failure Interstitial lung disease Chronic obstructive pulmonary disease RSV pneumonia Staph pneumonia Stenotrophomonas pneumonia Metabolic encephalopathy  Continue antibiotic therapy Complete course of Ancef Continue oxygen supplementation Cautious diuresis Continue bronchodilators including Brovana, Pulmicort, Yupelri Continue steroids  He is DNR status  Continue other lines of care  Virl Diamond, MD [facility] Pager: See Loretha Stapler

## 2023-08-09 NOTE — Progress Notes (Signed)
NAME:  Luis Brown, MRN:  [id number], DOB:  Aug 07, 1945, LOS: 6 ADMISSION DATE:  08/03/2023, CONSULTATION DATE:  08/07/23 REFERRING MD:  TRH, CHIEF COMPLAINT:  acute on chronic hypoxic resp failure   History of Present Illness:  78 yo male presented a day ago with worsening sob. Pt was found to be worsning hypoxia not tolerant of NRB or NIV so was placed on hhfnc which has been sufficient thus far. However pt's mentation continues to decline, he is oriented only to self at this time. Family is en route. I have attempted to discuss with him code status but his mentation is prohibitive of this.   Pt is currently agitated pulling at lines, pulling at oxygen tubing despite mittens bilaterally. Saying " I just want to get home"  UPDATE:  D/w wife pt would never want cpr or intubation. Pt made DNR/DNI   Pertinent  Medical History  Eosinophilic penumonitis,  ILD Pulm fibrosis Copd -acute exacerbation,  Crhonic hypoxic resp failure Depression Anxiety Hyperlipidemia H/o pe with dvt  Significant Hospital Events: Including procedures, antibiotic start and stop dates in addition to other pertinent events   1/16 Admitted to hospital   1/20 with rapid decline req ccm consultation for resp distress. Added treatment for stenotrophamonas. Made DNR 1/21 better post adding rx for steno  1/22 [facility] wt 55L/min this am  Interim History / Subjective:  States he feel poor with complaints of headache   Objective   Blood pressure (!) 155/70, pulse (!) 126, temperature 98.8 F (37.1 C), resp. rate (!) 30, height 5\' 9"  (1.753 m), weight 90 kg, SpO2 (!) 88%.    FiO2 (%):  [45 %-60 %] 50 %   Intake/Output Summary (Last 24 hours) at 08/09/2023 0708 Last data filed at 08/09/2023 2130 Gross per 24 hour  Intake 630.82 ml  Output 3550 ml  Net -2919.18 ml   Filed Weights   08/03/23 1857  Weight: 90 kg    Examination: General: Acute on chronically ill appearing elderly male lying in bed, in  NAD HEENT: [place]/AT, MM pink/moist, PERRL,  Neuro: Alert and oriented x3, non-focal  CV: s1s2 regular rate and rhythm, no murmur, rubs, or gallops,  PULM: Rhonchi bilaterally, right greater than left, on 55 L heated high flow, no increased work of breathing GI: soft, bowel sounds active in all 4 quadrants, non-tender, non-distended,  Extremities: warm/dry, no edema  Skin: no rashes or lesions  Resolved Hospital Problem list     Assessment & Plan:  Acute on chronic hypoxic resp failure ILD/IPF COPD HFpEF RSV pna Staph pna Stenotrophamonas pna Volume overload  Acute metabolic encephalopathy Hyperglycemia  Anemia  Pulm/ccm prob list Acute on chronic hypoxic resp failure secondary to RSV viral PNA complicated further by secondary staph and stenotrophamonas PNA superimposed on Pulm fibrosis/ILD and COPD -had been treated for staph, started bactrim 1/20 then levaquin as the stenotrophamonas was resistant to bactrim 1/21. Clinically improved  P: Continue Ancef x7 days  Levaquin and Minocyline for Stenotrophamomas  Wean supplemental oxygen as able remains on 60L [facility] Diureses as able  Continue Brovana, Pulmicort, and Yupelri Continue IV solu-medrol weaned to 40 daily 1/22 Encourage pulmonary hygiene  Mobilize as able   Acute metabolic encephalopathy Anxiety -improved P: Delirium precautions  Minimize sedation   Best Practice (right click and "Reselect all SmartList Selections" daily)   Diet/type: Regular consistency (see orders) DVT prophylaxis LMWH Pressure ulcer(s): N/A GI prophylaxis: H2B Lines: N/A Foley:  N/A Code Status:  DNR Last  date of multidisciplinary goals of care discussion changed to dnr/dni after discussion with family regarding his acute on chronic condition]  Critical care time: NA  Coulton Schlink D. Harris, NP-C [facility] Personal contact information can be found on Amion  If no contact or response made please call [phone] 08/09/2023,  7:10 AM

## 2023-08-09 NOTE — Progress Notes (Signed)
PROGRESS NOTE    Luis Brown  UYQ:[id number] DOB: [birth year] DOA: 08/03/2023 PCP: Bradd Canary, MD    Brief Narrative:  78 yo male with PMH eosinophilic pneumonitis, ILD, pulmonary fibrosis, COPD, chronic hypoxia on 2 L O2 at home, PAF, history of PE and DVT on Coumadin, GERD, malnutrition, HLD, anxiety/depression, PMR who presented with cough, congestion, and worsening shortness of breath. He was tachycardic, tachypneic, and had leukocytosis on admission. CXR showed diffuse interstitial opacities.  Respiratory swabs were positive for RSV. He was started on steroids, nebs, and antibiotics.  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   Paroxysmal atrial fibrillation (HCC)   Hyponatremia   Insomnia   BPH (benign prostatic hyperplasia)   GERD (gastroesophageal reflux disease)   History of DVT (deep vein thrombosis)   Hyperlipidemia   Pulmonary fibrosis (HCC)   History of pulmonary embolism   Hypochloremia   Generalized anxiety disorder   Insulin dependent type 2 diabetes mellitus (HCC)   RSV (respiratory syncytial virus pneumonia)   #1 sepsis secondary to viral RSV pneumonia superimposed with MSSA and stenotrophomonas pneumonia in the setting of interstitial lung disease his O2 requirement is still very high at 55 heated high flow nasal cannula Sputum culture grew stenotrophomonas and Staph aureus  Currently on Levaquin and minocycline for stenotrophomonas and MSSA On Ancef Leukocytosis worsening likely due to added steroids continue current antibiotics  #2 interstitial lung disease in the setting of COPD and pulmonary fibrosis PCCM following appreciate input continue steroids nebs Brovana Pulmicort and Yupelri   #3 history of DVT and pulmonary embolism patient feels Xarelto  and developed DVT while on Xarelto currently on Lovenox  #4 paroxysmal atrial fibrillation he was placed on Lovenox when he was not able to take p.o. Coumadin/medications due to obtunded mental status  #5 type 2 diabetes A1c 8.8 continue Semglee and NovoLog 4 units 3 times daily CBG (last 3)  Recent Labs    08/08/23 2304 08/09/23 0417 08/09/23 0815  GLUCAP 194* 127* 136*      #6 hyperlipidemia not on any medications but has been controlled with last LDL of 76 triglycerides 110  #7 BPH continue Flomax  #8 generalized anxiety disorder and insomnia on Effexor and Wellbutrin  #9 peripheral neuropathy on Lyrica and Zanaflex  #10 history of diastolic heart failure has been getting as needed Lasix   #11 CODE STATUS patient DNR  #12 hyponatremia resolved  Estimated body mass index is 29.3 kg/m as calculated from the following:   Height as of this encounter: 5\' 9"  (1.753 m).   Weight as of this encounter: 90 kg.  DVT prophylaxis: Lovenox Code Status: DN R Family Communication: None at bedside  disposition Plan:  Status is: Inpatient Remains inpatient appropriate because: Acute illness   Consultants:  PCCM  Procedures: None Antimicrobials: Levaquin Ancef and minocycline  Subjective: Patient is resting in bed on high flow nasal cannula at 53 reports he is not feeling great today compared to yesterday did not sleep well is tachycardic has a productive cough  Objective: Vitals:   08/09/23 0900 08/09/23 1000 08/09/23 1100 08/09/23 1102  BP: (!) 152/76 (!) 174/70 (!) 121/91   Pulse: (!) 116 (!) 110 (!) 117 (!) 115  Resp: (!) 31 (!) 23 (!) 29 (!) 32  Temp:  98.6 F (37 C)  TempSrc:    Oral  SpO2: 94% 93% 94% 95%  Weight:      Height:        Intake/Output Summary (Last 24 hours) at 08/09/2023 1109 Last data filed at 08/09/2023 1102 Gross per 24 hour  Intake 356.16 ml  Output 2850 ml  Net -2493.84 ml   Filed Weights   08/03/23 1857  Weight: 90 kg     Examination:  General exam: Appears in mild distress  respiratory system: rhonchi Cardiovascular system: Tachy gastrointestinal system: Abdomen is nondistended, soft and nontender. No organomegaly or masses felt. Normal bowel sounds heard. Central nervous system: Alert and oriented. No focal neurological deficits. Extremities: No edema   Data Reviewed: I have personally reviewed following labs and imaging studies  CBC: Recent Labs  Lab 08/05/23 0237 08/06/23 0242 08/07/23 0231 08/08/23 0306 08/09/23 0316  WBC 10.9* 11.4* 12.6* 10.1 15.3*  NEUTROABS 9.9* 10.5* 11.6* 9.3* 13.7*  HGB 10.3* 11.0* 12.6* 11.9* 12.9*  HCT 32.4* 33.8* 39.2 35.9* 40.5  MCV 88.3 89.9 87.3 86.9 91.0  PLT 331 345 383 332 459*   Basic Metabolic Panel: Recent Labs  Lab 08/04/23 0346 08/04/23 0804 08/04/23 1040 08/05/23 0237 08/06/23 0242 08/07/23 0231 08/08/23 0306 08/09/23 0316  NA 120*   < > 123* 129*  --  133* 135 135  K 4.0  --   --  3.8  --  3.3* 3.9 3.5  CL 87*  --   --  100  --  95* 98 96*  CO2 22  --   --  22  --  26 28 27   GLUCOSE 367*  --   --  147*  --  161* 244* 123*  BUN 16  --   --  16  --  25* 25* 28*  CREATININE 0.49*  --   --  0.42*  --  0.47* 0.50* 0.43*  CALCIUM 8.7*  --   --  8.4*  --  8.4* 8.5* 8.3*  MG  --   --   --  2.0 2.1 2.0 2.2 2.0   < > = values in this interval not displayed.   GFR: Estimated Creatinine Clearance: 83 mL/min (A) (by C-G formula based on SCr of 0.43 mg/dL (L)). Liver Function Tests: Recent Labs  Lab 08/04/23 0346 08/05/23 0237  AST 26 22  ALT 18 14  ALKPHOS 67 56  BILITOT 1.1 0.5  PROT 6.0* 5.3*  ALBUMIN 3.4* 2.8*   No results for input(s): "LIPASE", "AMYLASE" in the last 168 hours. No results for input(s): "AMMONIA" in the last 168 hours. Coagulation Profile: Recent Labs  Lab 08/05/23 0237 08/06/23 0242 08/07/23 0231 08/08/23 0306 08/09/23 0316  INR 2.6* 1.6* 1.8* 2.9* 2.4*   Cardiac Enzymes: No results for input(s):  "CKTOTAL", "CKMB", "CKMBINDEX", "TROPONINI" in the last 168 hours. BNP (last 3 results) Recent Labs    05/31/23 1534  PROBNP 40.0   HbA1C: No results for input(s): "HGBA1C" in the last 72 hours. CBG: Recent Labs  Lab 08/08/23 1532 08/08/23 1928 08/08/23 2304 08/09/23 0417 08/09/23 0815  GLUCAP 393* 273* 194* 127* 136*   Lipid Profile: No results for input(s): "CHOL", "HDL", "LDLCALC", "TRIG", "CHOLHDL", "LDLDIRECT" in the last 72 hours. Thyroid Function Tests: No results for input(s): "TSH", "T4TOTAL", "FREET4", "T3FREE", "THYROIDAB" in the last 72 hours. Anemia Panel: No results for input(s): "VITAMINB12", "FOLATE", "FERRITIN", "TIBC", "IRON", "RETICCTPCT" in the last 72 hours. Sepsis Labs: Recent Labs  Lab 08/03/23 2200  08/04/23 0346 08/04/23 0804 08/04/23 1040  PROCALCITON  --   --   --  6.65  LATICACIDVEN 2.1* 2.3* 2.2* 1.6    Recent Results (from the past 240 hours)  Culture, blood (routine x 2)     Status: None   Collection Time: 08/03/23  9:00 PM   Specimen: BLOOD RIGHT ARM  Result Value Ref Range Status   Specimen Description   Final    BLOOD RIGHT ARM Performed at [facility], [address] [place], [place] [address]    Special Requests   Final    BOTTLES DRAWN AEROBIC AND ANAEROBIC Blood Culture results may not be optimal due to an inadequate volume of blood received in culture bottles Performed at [facility], [address] [place], [place] [address]    Culture   Final    NO GROWTH 5 DAYS Performed at [facility], [address], [place], [place] [address]    Report Status 08/08/2023 FINAL  Final  Culture, blood (routine x 2)     Status: None   Collection Time: 08/03/23  9:10 PM   Specimen: BLOOD  Result Value Ref Range Status   Specimen Description   Final    BLOOD RIGHT ANTECUBITAL Performed at [facility], [address] [place], [place] [address]    Special Requests   Final     BOTTLES DRAWN AEROBIC AND ANAEROBIC Blood Culture results may not be optimal due to an inadequate volume of blood received in culture bottles Performed at [facility], [address], [place], [place] [address]    Culture   Final    NO GROWTH 5 DAYS Performed at [facility], [address] [place], [place] [address]    Report Status 08/08/2023 FINAL  Final  Respiratory (~20 pathogens) panel by PCR     Status: Abnormal   Collection Time: 08/04/23  1:48 AM   Specimen: Nasopharyngeal Swab; Respiratory  Result Value Ref Range Status   Adenovirus NOT DETECTED NOT DETECTED Final   Coronavirus 229E NOT DETECTED NOT DETECTED Final    Comment: (NOTE) The Coronavirus on the Respiratory Panel, DOES NOT test for the novel  Coronavirus (2019 nCoV)    Coronavirus HKU1 NOT DETECTED NOT DETECTED Final   Coronavirus NL63 NOT DETECTED NOT DETECTED Final   Coronavirus OC43 NOT DETECTED NOT DETECTED Final   Metapneumovirus NOT DETECTED NOT DETECTED Final   Rhinovirus / Enterovirus NOT DETECTED NOT DETECTED Final   Influenza A NOT DETECTED NOT DETECTED Final   Influenza B NOT DETECTED NOT DETECTED Final   Parainfluenza Virus 1 NOT DETECTED NOT DETECTED Final   Parainfluenza Virus 2 NOT DETECTED NOT DETECTED Final   Parainfluenza Virus 3 NOT DETECTED NOT DETECTED Final   Parainfluenza Virus 4 NOT DETECTED NOT DETECTED Final   Respiratory Syncytial Virus DETECTED (A) NOT DETECTED Final   Bordetella pertussis NOT DETECTED NOT DETECTED Final   Bordetella Parapertussis NOT DETECTED NOT DETECTED Final   Chlamydophila pneumoniae NOT DETECTED NOT DETECTED Final   Mycoplasma pneumoniae NOT DETECTED NOT DETECTED Final    Comment: Performed at [facility], [address] [place], [place] [address]  Expectorated Sputum Assessment w Gram Stain, Rflx to Resp Cult     Status: None   Collection Time: 08/04/23  8:49 AM   Specimen: Expectorated Sputum  Result Value Ref Range Status    Specimen Description EXPECTORATED SPUTUM  Final  Special Requests Immunocompromised  Final   Sputum evaluation   Final    THIS SPECIMEN IS ACCEPTABLE FOR SPUTUM CULTURE Performed at [facility], [address] [place], [place] [address]    Report Status 08/05/2023 FINAL  Final  MRSA Next Gen by PCR, Nasal     Status: None   Collection Time: 08/04/23  8:49 AM   Specimen: Nasal Mucosa; Nasal Swab  Result Value Ref Range Status   MRSA by PCR Next Gen NOT DETECTED NOT DETECTED Final    Comment: (NOTE) The GeneXpert MRSA Assay (FDA approved for NASAL specimens only), is one component of a comprehensive MRSA colonization surveillance program. It is not intended to diagnose MRSA infection nor to guide or monitor treatment for MRSA infections. Test performance is not FDA approved in patients less than 87 years old. Performed at [facility], [address], [place], [place] [address]   Culture, Respiratory w Gram Stain     Status: None   Collection Time: 08/04/23  8:49 AM  Result Value Ref Range Status   Specimen Description   Final    EXPECTORATED SPUTUM Performed at [facility], [address], [place], [place] [address]    Special Requests   Final    Immunocompromised Reflexed from [phone] Performed at [facility], [address], [place], [place] [address]    Gram Stain   Final    NO WBC SEEN FEW GRAM POSITIVE COCCI Performed at [facility], [address], [place], [place] [address]    Culture   Final    ABUNDANT STAPHYLOCOCCUS AUREUS ABUNDANT STENOTROPHOMONAS MALTOPHILIA    Report Status 08/07/2023 FINAL  Final   Organism ID, Bacteria STAPHYLOCOCCUS AUREUS  Final   Organism ID, Bacteria STENOTROPHOMONAS MALTOPHILIA  Final      Susceptibility   Staphylococcus aureus - MIC*    CIPROFLOXACIN <=0.5 SENSITIVE Sensitive     ERYTHROMYCIN <=0.25 SENSITIVE Sensitive     GENTAMICIN <=0.5  SENSITIVE Sensitive     OXACILLIN 0.5 SENSITIVE Sensitive     TETRACYCLINE <=1 SENSITIVE Sensitive     VANCOMYCIN 1 SENSITIVE Sensitive     TRIMETH/SULFA <=10 SENSITIVE Sensitive     CLINDAMYCIN <=0.25 SENSITIVE Sensitive     RIFAMPIN <=0.5 SENSITIVE Sensitive     Inducible Clindamycin NEGATIVE Sensitive     LINEZOLID 2 SENSITIVE Sensitive     * ABUNDANT STAPHYLOCOCCUS AUREUS   Stenotrophomonas maltophilia - MIC*    LEVOFLOXACIN 2 SENSITIVE Sensitive     TRIMETH/SULFA 80 RESISTANT Resistant     * ABUNDANT STENOTROPHOMONAS MALTOPHILIA         Radiology Studies: ECHOCARDIOGRAM COMPLETE Result Date: 08/08/2023    ECHOCARDIOGRAM REPORT   Patient Name:   MOHID SYDOW Date of Exam: 08/08/2023 Medical Rec #:  [id number]        Height:       69.0 in Accession #:    [id number]       Weight:       198.4 lb Date of Birth:  07-20-1945         BSA:          2.059 m Patient Age:    77 years         BP:           204/65 mmHg Patient Gender: M                HR:  104 bpm. Exam Location:  Inpatient Procedure: 2D Echo, Cardiac Doppler and Color Doppler Indications:    Acute respiratory distress  History:        Patient has prior history of Echocardiogram examinations, most                 recent 07/20/2021. COPD, Arrythmias:Tachycardia and Atrial                 Fibrillation, Signs/Symptoms:Fatigue and Shortness of Breath;                 Risk Factors:Hypertension and Dyslipidemia.  Sonographer:    Vern Claude Referring Phys: [id number] PETER E BABCOCK IMPRESSIONS  1. Left ventricular ejection fraction, by estimation, is 60 to 65%. The left ventricle has normal function. The left ventricle has no regional wall motion abnormalities. Left ventricular diastolic parameters are consistent with Grade I diastolic dysfunction (impaired relaxation).  2. Right ventricular systolic function is normal. The right ventricular size is normal. Tricuspid regurgitation signal is inadequate for assessing PA pressure.  3. The  mitral valve is normal in structure. No evidence of mitral valve regurgitation. No evidence of mitral stenosis.  4. The aortic valve is normal in structure. Aortic valve regurgitation is not visualized. No aortic stenosis is present.  5. The inferior vena cava is normal in size with greater than 50% respiratory variability, suggesting right atrial pressure of 3 mmHg. FINDINGS  Left Ventricle: Left ventricular ejection fraction, by estimation, is 60 to 65%. The left ventricle has normal function. The left ventricle has no regional wall motion abnormalities. The left ventricular internal cavity size was normal in size. There is  no left ventricular hypertrophy. Left ventricular diastolic parameters are consistent with Grade I diastolic dysfunction (impaired relaxation). Normal left ventricular filling pressure. Right Ventricle: The right ventricular size is normal. No increase in right ventricular wall thickness. Right ventricular systolic function is normal. Tricuspid regurgitation signal is inadequate for assessing PA pressure. Left Atrium: Left atrial size was normal in size. Right Atrium: Right atrial size was normal in size. Pericardium: There is no evidence of pericardial effusion. Mitral Valve: The mitral valve is normal in structure. No evidence of mitral valve regurgitation. No evidence of mitral valve stenosis. MV peak gradient, 6.7 mmHg. The mean mitral valve gradient is 3.0 mmHg. Tricuspid Valve: The tricuspid valve is normal in structure. Tricuspid valve regurgitation is not demonstrated. No evidence of tricuspid stenosis. Aortic Valve: The aortic valve is normal in structure. Aortic valve regurgitation is not visualized. No aortic stenosis is present. Aortic valve mean gradient measures 5.0 mmHg. Aortic valve peak gradient measures 10.8 mmHg. Aortic valve area, by VTI measures 4.08 cm. Pulmonic Valve: The pulmonic valve was normal in structure. Pulmonic valve regurgitation is not visualized. No evidence  of pulmonic stenosis. Aorta: The aortic root is normal in size and structure. Venous: The inferior vena cava is normal in size with greater than 50% respiratory variability, suggesting right atrial pressure of 3 mmHg. IAS/Shunts: No atrial level shunt detected by color flow Doppler.  LEFT VENTRICLE PLAX 2D LVIDd:         4.20 cm      Diastology LVIDs:         2.80 cm      LV e' medial:    11.30 cm/s LV PW:         0.80 cm      LV E/e' medial:  7.2 LV IVS:        1.10  cm      LV e' lateral:   9.68 cm/s LVOT diam:     2.30 cm      LV E/e' lateral: 8.4 LV SV:         91 LV SV Index:   44 LVOT Area:     4.15 cm  LV Volumes (MOD) LV vol d, MOD A2C: 102.0 ml LV vol d, MOD A4C: 143.0 ml LV vol s, MOD A2C: 32.8 ml LV vol s, MOD A4C: 60.5 ml LV SV MOD A2C:     69.2 ml LV SV MOD A4C:     143.0 ml LV SV MOD BP:      77.4 ml RIGHT VENTRICLE             IVC RV Basal diam:  3.50 cm     IVC diam: 1.30 cm RV S prime:     23.40 cm/s TAPSE (M-mode): 3.3 cm LEFT ATRIUM             Index        RIGHT ATRIUM           Index LA diam:        3.10 cm 1.51 cm/m   RA Area:     10.60 cm LA Vol (A2C):   22.6 ml 10.98 ml/m  RA Volume:   20.50 ml  9.96 ml/m LA Vol (A4C):   20.2 ml 9.81 ml/m LA Biplane Vol: 22.5 ml 10.93 ml/m  AORTIC VALVE                     PULMONIC VALVE AV Area (Vmax):    4.21 cm      PV Vmax:       1.44 m/s AV Area (Vmean):   4.00 cm      PV Peak grad:  8.2 mmHg AV Area (VTI):     4.08 cm AV Vmax:           164.00 cm/s AV Vmean:          107.000 cm/s AV VTI:            0.223 m AV Peak Grad:      10.8 mmHg AV Mean Grad:      5.0 mmHg LVOT Vmax:         166.00 cm/s LVOT Vmean:        103.000 cm/s LVOT VTI:          0.219 m LVOT/AV VTI ratio: 0.98  AORTA Ao Root diam: 3.10 cm Ao Asc diam:  3.10 cm MITRAL VALVE MV Area (PHT): 6.83 cm    SHUNTS MV Area VTI:   5.72 cm    Systemic VTI:  0.22 m MV Peak grad:  6.7 mmHg    Systemic Diam: 2.30 cm MV Mean grad:  3.0 mmHg MV Vmax:       1.29 m/s MV Vmean:      77.7 cm/s MV  Decel Time: 111 msec MV E velocity: 81.50 cm/s MV A velocity: 99.90 cm/s MV E/A ratio:  0.82 Mihai Croitoru MD Electronically signed by Thurmon Fair MD Signature Date/Time: 08/08/2023/3:40:47 PM    Final     Scheduled Meds:  arformoterol  15 mcg Nebulization BID   budesonide (PULMICORT) nebulizer solution  0.5 mg Nebulization BID   busPIRone  15 mg Oral TID   Chlorhexidine Gluconate Cloth  6 each Topical Daily   dextromethorphan-guaiFENesin  2 tablet Oral BID   enoxaparin (LOVENOX) injection  90 mg Subcutaneous Q12H   insulin aspart  0-15 Units Subcutaneous Q4H   insulin glargine-yfgn  10 Units Subcutaneous Daily   losartan  50 mg Oral Daily   methylPREDNISolone (SOLU-MEDROL) injection  40 mg Intravenous Daily   minocycline  200 mg Oral BID   pregabalin  75 mg Oral BID   revefenacin  175 mcg Nebulization Daily   tamsulosin  0.8 mg Oral QPM   venlafaxine XR  150 mg Oral Q breakfast   Continuous Infusions:   ceFAZolin (ANCEF) IV Stopped (08/09/23 0500)   levofloxacin (LEVAQUIN) IV Stopped (08/08/23 1451)     LOS: 6 days    Time spent: 39 min Alwyn Ren, MD 08/09/2023, 11:09 AM

## 2023-08-10 DIAGNOSIS — J189 Pneumonia, unspecified organism: Secondary | ICD-10-CM | POA: Diagnosis not present

## 2023-08-10 DIAGNOSIS — R000 Tachycardia, unspecified: Secondary | ICD-10-CM | POA: Diagnosis not present

## 2023-08-10 DIAGNOSIS — J9601 Acute respiratory failure with hypoxia: Secondary | ICD-10-CM

## 2023-08-10 DIAGNOSIS — J441 Chronic obstructive pulmonary disease with (acute) exacerbation: Secondary | ICD-10-CM | POA: Diagnosis not present

## 2023-08-10 DIAGNOSIS — A498 Other bacterial infections of unspecified site: Secondary | ICD-10-CM | POA: Diagnosis not present

## 2023-08-10 LAB — GLUCOSE, CAPILLARY
Glucose-Capillary: 160 mg/dL — ABNORMAL HIGH (ref 70–99)
Glucose-Capillary: 186 mg/dL — ABNORMAL HIGH (ref 70–99)
Glucose-Capillary: 212 mg/dL — ABNORMAL HIGH (ref 70–99)
Glucose-Capillary: 249 mg/dL — ABNORMAL HIGH (ref 70–99)
Glucose-Capillary: 271 mg/dL — ABNORMAL HIGH (ref 70–99)
Glucose-Capillary: 330 mg/dL — ABNORMAL HIGH (ref 70–99)
Glucose-Capillary: 330 mg/dL — ABNORMAL HIGH (ref 70–99)

## 2023-08-10 LAB — BLOOD GAS, ARTERIAL
Acid-Base Excess: 4.7 mmol/L — ABNORMAL HIGH (ref 0.0–2.0)
Bicarbonate: 29.2 mmol/L — ABNORMAL HIGH (ref 20.0–28.0)
Drawn by: 225631
FIO2: 66 %
O2 Saturation: 100 %
Patient temperature: 36.6
RATE: 24 {breaths}/min
pCO2 arterial: 41 mm[Hg] (ref 32–48)
pH, Arterial: 7.46 — ABNORMAL HIGH (ref 7.35–7.45)
pO2, Arterial: 143 mm[Hg] — ABNORMAL HIGH (ref 83–108)

## 2023-08-10 LAB — BASIC METABOLIC PANEL
Anion gap: 11 (ref 5–15)
BUN: 21 mg/dL (ref 8–23)
CO2: 29 mmol/L (ref 22–32)
Calcium: 8.3 mg/dL — ABNORMAL LOW (ref 8.9–10.3)
Chloride: 96 mmol/L — ABNORMAL LOW (ref 98–111)
Creatinine, Ser: 0.47 mg/dL — ABNORMAL LOW (ref 0.61–1.24)
GFR, Estimated: >60 mL/min (ref >60)
Glucose, Bld: 182 mg/dL — ABNORMAL HIGH (ref 70–99)
Potassium: 3.3 mmol/L — ABNORMAL LOW (ref 3.5–5.1)
Sodium: 136 mmol/L (ref 135–145)

## 2023-08-10 LAB — PROTIME-INR
INR: 1.6 — ABNORMAL HIGH (ref 0.8–1.2)
Prothrombin Time: 19.6 s — ABNORMAL HIGH (ref 11.4–15.2)

## 2023-08-10 LAB — MAGNESIUM: Magnesium: 2.1 mg/dL (ref 1.7–2.4)

## 2023-08-10 MED ORDER — OXYCODONE-ACETAMINOPHEN 7.5-325 MG PO TABS
1.0000 | ORAL_TABLET | ORAL | Status: DC
  Administered 2023-08-10 – 2023-08-14 (×21): 1 via ORAL
  Filled 2023-08-10 (×21): qty 1

## 2023-08-10 MED ORDER — NOREPINEPHRINE 4 MG/250ML-% IV SOLN
2.0000 ug/min | INTRAVENOUS | Status: DC
  Administered 2023-08-10 – 2023-08-12 (×3): 2 ug/min via INTRAVENOUS
  Filled 2023-08-10 (×2): qty 250

## 2023-08-10 MED ORDER — ZOLPIDEM TARTRATE 5 MG PO TABS: 5.0000 mg | ORAL_TABLET | Freq: Every evening | ORAL | Status: DC | PRN

## 2023-08-10 MED ORDER — PHENYLEPHRINE HCL-NACL 20-0.9 MG/250ML-% IV SOLN: 25.0000 ug/min | INTRAVENOUS | Status: DC

## 2023-08-10 MED ORDER — SODIUM CHLORIDE 0.9 % IV SOLN
250.0000 mL | INTRAVENOUS | Status: AC
Start: 2023-08-10 — End: 2023-08-11
  Administered 2023-08-10: 250 mL via INTRAVENOUS

## 2023-08-10 MED ORDER — WARFARIN SODIUM 2.5 MG PO TABS
2.5000 mg | ORAL_TABLET | Freq: Once | ORAL | Status: AC
  Administered 2023-08-10: 2.5 mg via ORAL
  Filled 2023-08-10: qty 1

## 2023-08-10 MED ORDER — WARFARIN - PHARMACIST DOSING INPATIENT: Freq: Every day | Status: DC

## 2023-08-10 MED ORDER — TIZANIDINE HCL 4 MG PO TABS
6.0000 mg | ORAL_TABLET | ORAL | Status: DC
  Administered 2023-08-10 – 2023-08-20 (×56): 6 mg via ORAL
  Filled 2023-08-10 (×2): qty 2
  Filled 2023-08-10: qty 1
  Filled 2023-08-10 (×12): qty 2
  Filled 2023-08-10: qty 1
  Filled 2023-08-10: qty 2
  Filled 2023-08-10: qty 1
  Filled 2023-08-10 (×7): qty 2
  Filled 2023-08-10: qty 1
  Filled 2023-08-10 (×2): qty 2
  Filled 2023-08-10: qty 1
  Filled 2023-08-10 (×7): qty 2
  Filled 2023-08-10: qty 1
  Filled 2023-08-10 (×2): qty 2
  Filled 2023-08-10: qty 1
  Filled 2023-08-10 (×5): qty 2
  Filled 2023-08-10: qty 1
  Filled 2023-08-10 (×10): qty 2
  Filled 2023-08-10: qty 1
  Filled 2023-08-10: qty 2
  Filled 2023-08-10: qty 1

## 2023-08-10 MED ORDER — LACTATED RINGERS IV BOLUS
500.0000 mL | Freq: Once | INTRAVENOUS | Status: AC
  Administered 2023-08-10: 500 mL via INTRAVENOUS

## 2023-08-10 MED ORDER — ZOLPIDEM TARTRATE 5 MG PO TABS
5.0000 mg | ORAL_TABLET | Freq: Every day | ORAL | Status: DC
  Administered 2023-08-10 – 2023-08-19 (×10): 5 mg via ORAL
  Filled 2023-08-10 (×10): qty 1

## 2023-08-10 MED ORDER — POTASSIUM CHLORIDE CRYS ER 20 MEQ PO TBCR
40.0000 meq | EXTENDED_RELEASE_TABLET | Freq: Once | ORAL | Status: AC
  Administered 2023-08-10: 40 meq via ORAL
  Filled 2023-08-10: qty 2

## 2023-08-10 MED ORDER — OXYCODONE-ACETAMINOPHEN 7.5-325 MG PO TABS: 1.0000 | ORAL_TABLET | ORAL | Status: DC

## 2023-08-10 MED ORDER — INSULIN ASPART 100 UNIT/ML IJ SOLN
4.0000 [IU] | Freq: Three times a day (TID) | INTRAMUSCULAR | Status: DC
Start: 2023-08-10 — End: 2023-08-18
  Administered 2023-08-10 – 2023-08-18 (×25): 4 [IU] via SUBCUTANEOUS

## 2023-08-10 NOTE — Procedures (Signed)
Arterial Catheter Insertion Procedure Note  JAVY TIBBITS  [id number]  [birth year]  Date:08/10/23  Time:9:37 PM    Provider Performing: Patrici Ranks    Procedure: Insertion of Arterial Line (40981) with US guidance ([address]147)   Indication(s) Blood pressure monitoring and/or need for frequent ABGs  Consent Risks of the procedure as well as the alternatives and risks of each were explained to the patient and/or caregiver.  Consent for the procedure was obtained and is signed in the bedside chart  Anesthesia None   Time Out Verified patient identification, verified procedure, site/side was marked, verified correct patient position, special equipment/implants available, medications/allergies/relevant history reviewed, required imaging and test results available.   Sterile Technique Maximal sterile technique including full sterile barrier drape, hand hygiene, sterile gown, sterile gloves, mask, hair covering, sterile ultrasound probe cover (if used).   Procedure Description Area of catheter insertion was cleaned with chlorhexidine and draped in sterile fashion. With real-time ultrasound guidance an arterial catheter was placed into the left radial artery.  Appropriate arterial tracings confirmed on monitor.     Complications/Tolerance None; patient tolerated the procedure well.   EBL Minimal   Specimen(s) None

## 2023-08-10 NOTE — Progress Notes (Signed)
eLink Physician-Brief Progress Note Patient Name: Luis Brown DOB: 1945/08/19 MRN: [id number]   Date of Service  08/10/2023  HPI/Events of Note  Patient with a BP of 77/27, MAP 40 after aggressive diuresis.  eICU Interventions  LR 500 ml IV bolus x 1, Peripheral Levo gtt, arterial line for closer BP monitoring, ABG to r/o profound acidosis as contributing factor to hypotension.        Migdalia Dk 08/10/2023, 7:48 PM

## 2023-08-10 NOTE — Progress Notes (Addendum)
PROGRESS NOTE    Luis Brown  ZOX:[id number] DOB: 16-May-1946 DOA: 08/03/2023 PCP: Bradd Canary, MD    Brief Narrative:  78 yo male with PMH eosinophilic pneumonitis, ILD, pulmonary fibrosis, COPD, chronic hypoxia on 2 L O2 at home, PAF, history of PE and DVT on Coumadin, GERD, malnutrition, HLD, anxiety/depression, PMR who presented with cough, congestion, and worsening shortness of breath. He was tachycardic, tachypneic, and had leukocytosis on admission. CXR showed diffuse interstitial opacities.  Respiratory swabs were positive for RSV. He was started on steroids, nebs, and antibiotics.  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   Paroxysmal atrial fibrillation (HCC)   Hyponatremia   Insomnia   BPH (benign prostatic hyperplasia)   GERD (gastroesophageal reflux disease)   History of DVT (deep vein thrombosis)   Hyperlipidemia   Pulmonary fibrosis (HCC)   History of pulmonary embolism   Hypochloremia   Generalized anxiety disorder   Insulin dependent type 2 diabetes mellitus (HCC)   RSV (respiratory syncytial virus pneumonia)   #1 sepsis secondary to viral RSV pneumonia superimposed with MSSA and stenotrophomonas pneumonia in the setting of interstitial lung disease his O2 requirement is still very high at 55 heated high flow nasal cannula Sputum culture grew stenotrophomonas and Staph aureus  Currently on Levaquin and minocycline for stenotrophomonas and MSSA On Ancef Leukocytosis worsening likely due to added steroids continue current antibiotics  #2 interstitial lung disease in the setting of COPD and pulmonary fibrosis PCCM following appreciate input continue steroids nebs Brovana Pulmicort and Yupelri   #3 history of DVT and pulmonary embolism patient feels Xarelto  and developed DVT while on Xarelto currently on Lovenox  #4 paroxysmal atrial fibrillation he was placed on Lovenox when he was not able to take p.o. Coumadin/medications due to obtunded mental status  #5 type 2 diabetes A1c 8.8 continue Semglee and NovoLog 4 units 3 times daily CBG (last 3)  Recent Labs    08/10/23 0006 08/10/23 0416 08/10/23 0821  GLUCAP 249* 160* 186*    #6 hyperlipidemia not on any medications but has been controlled with last LDL of 76 triglycerides 110  #7 BPH continue Flomax  #8 generalized anxiety disorder and insomnia on Effexor and Wellbutrin  #9 peripheral neuropathy on Lyrica and Zanaflex  #10 history of diastolic heart failure has been getting as needed Lasix   #11 CODE STATUS patient DNR  #12 hyponatremia resolved  #13 hypokalemia repleted  #14 insomnia restart Ambien when he was taking at home 10 mg nightly  Estimated body mass index is 29.3 kg/m as calculated from the following:   Height as of this encounter: 5\' 9"  (1.753 m).   Weight as of this encounter: 90 kg.  DVT prophylaxis: Lovenox Code Status: DN R Family Communication: None at bedside  disposition Plan:  Status is: Inpatient Remains inpatient appropriate because: Acute illness   Consultants:  PCCM  Procedures: None Antimicrobials: Levaquin Ancef and minocycline  Subjective:  Did not sleep well  Has a terrible headache takes Ambien every night at home  objective: Vitals:   08/10/23 0800 08/10/23 0900 08/10/23 0930 08/10/23 1000  BP: (!) 157/79 (!) 162/69  (!) 165/71  Pulse: (!) 106 (!) 104  (!) 105  Resp: (!) 23 (!) 25  (!) 25  Temp: 99.1 F (37.3 C)  TempSrc: Oral     SpO2: 94% 98% 97% 99%  Weight:      Height:        Intake/Output Summary (Last 24 hours) at 08/10/2023 1052 Last data filed at 08/10/2023 9562 Gross per 24 hour  Intake 250.15 ml  Output 1875 ml  Net -1624.85 ml   Filed Weights   08/03/23 1857  Weight: 90 kg     Examination:  General exam: Appears in mild distress  respiratory system: rhonchi Cardiovascular system: Tachy gastrointestinal system: Abdomen is nondistended, soft and nontender. No organomegaly or masses felt. Normal bowel sounds heard. Central nervous system: Alert and oriented. No focal neurological deficits. Extremities: No edema   Data Reviewed: I have personally reviewed following labs and imaging studies  CBC: Recent Labs  Lab 08/05/23 0237 08/06/23 0242 08/07/23 0231 08/08/23 0306 08/09/23 0316  WBC 10.9* 11.4* 12.6* 10.1 15.3*  NEUTROABS 9.9* 10.5* 11.6* 9.3* 13.7*  HGB 10.3* 11.0* 12.6* 11.9* 12.9*  HCT 32.4* 33.8* 39.2 35.9* 40.5  MCV 88.3 89.9 87.3 86.9 91.0  PLT 331 345 383 332 459*   Basic Metabolic Panel: Recent Labs  Lab 08/05/23 0237 08/06/23 0242 08/07/23 0231 08/08/23 0306 08/09/23 0316 08/10/23 0308  NA 129*  --  133* 135 135 136  K 3.8  --  3.3* 3.9 3.5 3.3*  CL 100  --  95* 98 96* 96*  CO2 22  --  26 28 27 29   GLUCOSE 147*  --  161* 244* 123* 182*  BUN 16  --  25* 25* 28* 21  CREATININE 0.42*  --  0.47* 0.50* 0.43* 0.47*  CALCIUM 8.4*  --  8.4* 8.5* 8.3* 8.3*  MG 2.0 2.1 2.0 2.2 2.0  --    GFR: Estimated Creatinine Clearance: 83 mL/min (A) (by C-G formula based on SCr of 0.47 mg/dL (L)). Liver Function Tests: Recent Labs  Lab 08/04/23 0346 08/05/23 0237  AST 26 22  ALT 18 14  ALKPHOS 67 56  BILITOT 1.1 0.5  PROT 6.0* 5.3*  ALBUMIN 3.4* 2.8*   No results for input(s): "LIPASE", "AMYLASE" in the last 168 hours. No results for input(s): "AMMONIA" in the last 168 hours. Coagulation Profile: Recent Labs  Lab 08/06/23 0242 08/07/23 0231 08/08/23 0306 08/09/23 0316 08/10/23 0308  INR 1.6* 1.8* 2.9* 2.4* 1.6*   Cardiac Enzymes: No results for input(s): "CKTOTAL", "CKMB", "CKMBINDEX", "TROPONINI" in the last 168 hours. BNP (last 3 results) Recent Labs    05/31/23 1534  PROBNP 40.0   HbA1C: No results for input(s):  "HGBA1C" in the last 72 hours. CBG: Recent Labs  Lab 08/09/23 1548 08/09/23 1956 08/10/23 0006 08/10/23 0416 08/10/23 0821  GLUCAP 345* 404* 249* 160* 186*   Lipid Profile: No results for input(s): "CHOL", "HDL", "LDLCALC", "TRIG", "CHOLHDL", "LDLDIRECT" in the last 72 hours. Thyroid Function Tests: No results for input(s): "TSH", "T4TOTAL", "FREET4", "T3FREE", "THYROIDAB" in the last 72 hours. Anemia Panel: No results for input(s): "VITAMINB12", "FOLATE", "FERRITIN", "TIBC", "IRON", "RETICCTPCT" in the last 72 hours. Sepsis Labs: Recent Labs  Lab 08/03/23 2200 08/04/23 0346 08/04/23 0804 08/04/23 1040  PROCALCITON  --   --   --  6.65  LATICACIDVEN 2.1* 2.3* 2.2* 1.6    Recent Results (from the past 240 hours)  Culture, blood (routine x 2)     Status: None   Collection Time: 08/03/23  9:00 PM   Specimen: BLOOD RIGHT ARM  Result Value Ref Range Status   Specimen Description   Final  BLOOD RIGHT ARM Performed at [facility], [address], [place], [place] [address]    Special Requests   Final    BOTTLES DRAWN AEROBIC AND ANAEROBIC Blood Culture results may not be optimal due to an inadequate volume of blood received in culture bottles Performed at [facility], [address], [place], [place] [address]    Culture   Final    NO GROWTH 5 DAYS Performed at [facility], [address] [place], [place] [address]    Report Status 08/08/2023 FINAL  Final  Culture, blood (routine x 2)     Status: None   Collection Time: 08/03/23  9:10 PM   Specimen: BLOOD  Result Value Ref Range Status   Specimen Description   Final    BLOOD RIGHT ANTECUBITAL Performed at [facility], [address] [place], [place] [address]    Special Requests   Final    BOTTLES DRAWN AEROBIC AND ANAEROBIC Blood Culture results may not be optimal due to an inadequate volume of blood received in culture bottles Performed at [facility], [address] [place], [place] [address]    Culture   Final    NO GROWTH 5 DAYS Performed at [facility], [address], [place], [place] [address]    Report Status 08/08/2023 FINAL  Final  Respiratory (~20 pathogens) panel by PCR     Status: Abnormal   Collection Time: 08/04/23  1:48 AM   Specimen: Nasopharyngeal Swab; Respiratory  Result Value Ref Range Status   Adenovirus NOT DETECTED NOT DETECTED Final   Coronavirus 229E NOT DETECTED NOT DETECTED Final    Comment: (NOTE) The Coronavirus on the Respiratory Panel, DOES NOT test for the novel  Coronavirus (2019 nCoV)    Coronavirus HKU1 NOT DETECTED NOT DETECTED Final   Coronavirus NL63 NOT DETECTED NOT DETECTED Final   Coronavirus OC43 NOT DETECTED NOT DETECTED Final   Metapneumovirus NOT DETECTED NOT DETECTED Final   Rhinovirus / Enterovirus NOT DETECTED NOT DETECTED Final   Influenza A NOT DETECTED NOT DETECTED Final   Influenza B NOT DETECTED NOT DETECTED Final   Parainfluenza Virus 1 NOT DETECTED NOT DETECTED Final   Parainfluenza Virus 2 NOT DETECTED NOT DETECTED Final   Parainfluenza Virus 3 NOT DETECTED NOT DETECTED Final   Parainfluenza Virus 4 NOT DETECTED NOT DETECTED Final   Respiratory Syncytial Virus DETECTED (A) NOT DETECTED Final   Bordetella pertussis NOT DETECTED NOT DETECTED Final   Bordetella Parapertussis NOT DETECTED NOT DETECTED Final   Chlamydophila pneumoniae NOT DETECTED NOT DETECTED Final   Mycoplasma pneumoniae NOT DETECTED NOT DETECTED Final    Comment: Performed at [facility], [address], [place], [place] [address]  Expectorated Sputum Assessment w Gram Stain, Rflx to Resp Cult     Status: None   Collection Time: 08/04/23  8:49 AM   Specimen: Expectorated Sputum  Result Value Ref Range Status   Specimen Description EXPECTORATED SPUTUM  Final   Special Requests Immunocompromised  Final   Sputum evaluation   Final    THIS SPECIMEN IS ACCEPTABLE FOR SPUTUM  CULTURE Performed at [facility], [address] [place], [place] [address]    Report Status 08/05/2023 FINAL  Final  MRSA Next Gen by PCR, Nasal     Status: None   Collection Time: 08/04/23  8:49 AM   Specimen: Nasal Mucosa; Nasal Swab  Result Value Ref  Range Status   MRSA by PCR Next Gen NOT DETECTED NOT DETECTED Final    Comment: (NOTE) The GeneXpert MRSA Assay (FDA approved for NASAL specimens only), is one component of a comprehensive MRSA colonization surveillance program. It is not intended to diagnose MRSA infection nor to guide or monitor treatment for MRSA infections. Test performance is not FDA approved in patients less than 29 years old. Performed at [facility], [address], [place], [place] [address]   Culture, Respiratory w Gram Stain     Status: None   Collection Time: 08/04/23  8:49 AM  Result Value Ref Range Status   Specimen Description   Final    EXPECTORATED SPUTUM Performed at [facility], [address] [place], [place] [address]    Special Requests   Final    Immunocompromised Reflexed from [phone] Performed at [facility], [address], [place], [place] [address]    Gram Stain   Final    NO WBC SEEN FEW GRAM POSITIVE COCCI Performed at [facility], [address], [place], [place] [address]    Culture   Final    ABUNDANT STAPHYLOCOCCUS AUREUS ABUNDANT STENOTROPHOMONAS MALTOPHILIA    Report Status 08/07/2023 FINAL  Final   Organism ID, Bacteria STAPHYLOCOCCUS AUREUS  Final   Organism ID, Bacteria STENOTROPHOMONAS MALTOPHILIA  Final      Susceptibility   Staphylococcus aureus - MIC*    CIPROFLOXACIN <=0.5 SENSITIVE Sensitive     ERYTHROMYCIN <=0.25 SENSITIVE Sensitive     GENTAMICIN <=0.5 SENSITIVE Sensitive     OXACILLIN 0.5 SENSITIVE Sensitive     TETRACYCLINE <=1 SENSITIVE Sensitive     VANCOMYCIN 1 SENSITIVE Sensitive     TRIMETH/SULFA <=10 SENSITIVE  Sensitive     CLINDAMYCIN <=0.25 SENSITIVE Sensitive     RIFAMPIN <=0.5 SENSITIVE Sensitive     Inducible Clindamycin NEGATIVE Sensitive     LINEZOLID 2 SENSITIVE Sensitive     * ABUNDANT STAPHYLOCOCCUS AUREUS   Stenotrophomonas maltophilia - MIC*    LEVOFLOXACIN 2 SENSITIVE Sensitive     TRIMETH/SULFA 80 RESISTANT Resistant     * ABUNDANT STENOTROPHOMONAS MALTOPHILIA         Radiology Studies: ECHOCARDIOGRAM COMPLETE Result Date: 08/08/2023    ECHOCARDIOGRAM REPORT   Patient Name:   Luis Brown Date of Exam: 08/08/2023 Medical Rec #:  [id number]        Height:       69.0 in Accession #:    [id number]       Weight:       198.4 lb Date of Birth:  July 08, 1946         BSA:          2.059 m Patient Age:    77 years         BP:           204/65 mmHg Patient Gender: M                HR:           104 bpm. Exam Location:  Inpatient Procedure: 2D Echo, Cardiac Doppler and Color Doppler Indications:    Acute respiratory distress  History:        Patient has prior history of Echocardiogram examinations, most                 recent 07/20/2021. COPD, Arrythmias:Tachycardia and Atrial  Fibrillation, Signs/Symptoms:Fatigue and Shortness of Breath;                 Risk Factors:Hypertension and Dyslipidemia.  Sonographer:    Vern Claude Referring Phys: [id number] PETER E BABCOCK IMPRESSIONS  1. Left ventricular ejection fraction, by estimation, is 60 to 65%. The left ventricle has normal function. The left ventricle has no regional wall motion abnormalities. Left ventricular diastolic parameters are consistent with Grade I diastolic dysfunction (impaired relaxation).  2. Right ventricular systolic function is normal. The right ventricular size is normal. Tricuspid regurgitation signal is inadequate for assessing PA pressure.  3. The mitral valve is normal in structure. No evidence of mitral valve regurgitation. No evidence of mitral stenosis.  4. The aortic valve is normal in structure. Aortic valve  regurgitation is not visualized. No aortic stenosis is present.  5. The inferior vena cava is normal in size with greater than 50% respiratory variability, suggesting right atrial pressure of 3 mmHg. FINDINGS  Left Ventricle: Left ventricular ejection fraction, by estimation, is 60 to 65%. The left ventricle has normal function. The left ventricle has no regional wall motion abnormalities. The left ventricular internal cavity size was normal in size. There is  no left ventricular hypertrophy. Left ventricular diastolic parameters are consistent with Grade I diastolic dysfunction (impaired relaxation). Normal left ventricular filling pressure. Right Ventricle: The right ventricular size is normal. No increase in right ventricular wall thickness. Right ventricular systolic function is normal. Tricuspid regurgitation signal is inadequate for assessing PA pressure. Left Atrium: Left atrial size was normal in size. Right Atrium: Right atrial size was normal in size. Pericardium: There is no evidence of pericardial effusion. Mitral Valve: The mitral valve is normal in structure. No evidence of mitral valve regurgitation. No evidence of mitral valve stenosis. MV peak gradient, 6.7 mmHg. The mean mitral valve gradient is 3.0 mmHg. Tricuspid Valve: The tricuspid valve is normal in structure. Tricuspid valve regurgitation is not demonstrated. No evidence of tricuspid stenosis. Aortic Valve: The aortic valve is normal in structure. Aortic valve regurgitation is not visualized. No aortic stenosis is present. Aortic valve mean gradient measures 5.0 mmHg. Aortic valve peak gradient measures 10.8 mmHg. Aortic valve area, by VTI measures 4.08 cm. Pulmonic Valve: The pulmonic valve was normal in structure. Pulmonic valve regurgitation is not visualized. No evidence of pulmonic stenosis. Aorta: The aortic root is normal in size and structure. Venous: The inferior vena cava is normal in size with greater than 50% respiratory  variability, suggesting right atrial pressure of 3 mmHg. IAS/Shunts: No atrial level shunt detected by color flow Doppler.  LEFT VENTRICLE PLAX 2D LVIDd:         4.20 cm      Diastology LVIDs:         2.80 cm      LV e' medial:    11.30 cm/s LV PW:         0.80 cm      LV E/e' medial:  7.2 LV IVS:        1.10 cm      LV e' lateral:   9.68 cm/s LVOT diam:     2.30 cm      LV E/e' lateral: 8.4 LV SV:         91 LV SV Index:   44 LVOT Area:     4.15 cm  LV Volumes (MOD) LV vol d, MOD A2C: 102.0 ml LV vol d, MOD A4C: 143.0 ml LV vol s,  MOD A2C: 32.8 ml LV vol s, MOD A4C: 60.5 ml LV SV MOD A2C:     69.2 ml LV SV MOD A4C:     143.0 ml LV SV MOD BP:      77.4 ml RIGHT VENTRICLE             IVC RV Basal diam:  3.50 cm     IVC diam: 1.30 cm RV S prime:     23.40 cm/s TAPSE (M-mode): 3.3 cm LEFT ATRIUM             Index        RIGHT ATRIUM           Index LA diam:        3.10 cm 1.51 cm/m   RA Area:     10.60 cm LA Vol (A2C):   22.6 ml 10.98 ml/m  RA Volume:   20.50 ml  9.96 ml/m LA Vol (A4C):   20.2 ml 9.81 ml/m LA Biplane Vol: 22.5 ml 10.93 ml/m  AORTIC VALVE                     PULMONIC VALVE AV Area (Vmax):    4.21 cm      PV Vmax:       1.44 m/s AV Area (Vmean):   4.00 cm      PV Peak grad:  8.2 mmHg AV Area (VTI):     4.08 cm AV Vmax:           164.00 cm/s AV Vmean:          107.000 cm/s AV VTI:            0.223 m AV Peak Grad:      10.8 mmHg AV Mean Grad:      5.0 mmHg LVOT Vmax:         166.00 cm/s LVOT Vmean:        103.000 cm/s LVOT VTI:          0.219 m LVOT/AV VTI ratio: 0.98  AORTA Ao Root diam: 3.10 cm Ao Asc diam:  3.10 cm MITRAL VALVE MV Area (PHT): 6.83 cm    SHUNTS MV Area VTI:   5.72 cm    Systemic VTI:  0.22 m MV Peak grad:  6.7 mmHg    Systemic Diam: 2.30 cm MV Mean grad:  3.0 mmHg MV Vmax:       1.29 m/s MV Vmean:      77.7 cm/s MV Decel Time: 111 msec MV E velocity: 81.50 cm/s MV A velocity: 99.90 cm/s MV E/A ratio:  0.82 Mihai Croitoru MD Electronically signed by Thurmon Fair MD Signature  Date/Time: 08/08/2023/3:40:47 PM    Final     Scheduled Meds:  arformoterol  15 mcg Nebulization BID   budesonide (PULMICORT) nebulizer solution  0.5 mg Nebulization BID   busPIRone  15 mg Oral TID   Chlorhexidine Gluconate Cloth  6 each Topical Daily   dextromethorphan-guaiFENesin  2 tablet Oral BID   enoxaparin (LOVENOX) injection  90 mg Subcutaneous Q12H   insulin aspart  0-15 Units Subcutaneous Q4H   insulin glargine-yfgn  10 Units Subcutaneous Daily   losartan  50 mg Oral Daily   methylPREDNISolone (SOLU-MEDROL) injection  40 mg Intravenous Daily   minocycline  200 mg Oral BID   pregabalin  75 mg Oral BID   revefenacin  175 mcg Nebulization Daily   tamsulosin  0.8 mg Oral QPM   venlafaxine  XR  150 mg Oral Q breakfast   Warfarin - Pharmacist Dosing Inpatient   Does not apply q1600   zolpidem  5 mg Oral QHS   Continuous Infusions:   ceFAZolin (ANCEF) IV Stopped (08/10/23 0510)   levofloxacin (LEVAQUIN) IV Stopped (08/09/23 1555)     LOS: 7 days    Time spent: 39 min Alwyn Ren, MD 08/10/2023, 10:52 AM

## 2023-08-10 NOTE — Evaluation (Signed)
Physical Therapy Evaluation Patient Details Name: DARTANYON TURI MRN: [id number] DOB: [birth year] Today's Date: 08/10/2023  History of Present Illness  78 yo male who presented with cough, congestion, and worsening shortness of breath.  He was tachycardic, tachypneic, and had leukocytosis on admission.  Positive for RSV.  PMH eosinophilic pneumonitis, ILD, pulmonary fibrosis, COPD, chronic hypoxia on 2 L O2 at home, PAF, history of PE and DVT on Coumadin, GERD, malnutrition, HLD, anxiety/depression, PMR  Clinical Impression  Pt admitted with above diagnosis.  Pt currently with functional limitations due to the deficits listed below (see PT Problem List). Pt will benefit from acute skilled PT to increase their independence and safety with mobility to allow discharge.  Pt on HHFNC for session and assist OOB to recliner.  Pt quickly fatigued but happy to bed OOB.  Pt's HR increased to 121 bpm however down to 109 bpm with sitting in recliner.  SpO2 dropped to 88-89% with transfer however mostly 91-93%.  Pt did report some mild dizziness with sitting EOB and BP 179/74 mmHg.  Pt eager to return to his previous level of function which was ambulatory without assistive device around home.  Pt anticipates return home with spouse upon d/c.         If plan is discharge home, recommend the following: A little help with walking and/or transfers;A little help with bathing/dressing/bathroom;Help with stairs or ramp for entrance;Assistance with cooking/housework   Can travel by private vehicle        Equipment Recommendations None recommended by PT  Recommendations for Other Services       Functional Status Assessment Patient has had a recent decline in their functional status and demonstrates the ability to make significant improvements in function in a reasonable and predictable amount of time.     Precautions / Restrictions Precautions Precautions: Fall Precaution Comments: chronic 2L O2       Mobility  Bed Mobility Overal bed mobility: Needs Assistance Bed Mobility: Supine to Sit     Supine to sit: Supervision     General bed mobility comments: supervision for lines    Transfers Overall transfer level: Needs assistance Equipment used: Rolling walker (2 wheels) Transfers: Sit to/from Stand, Bed to chair/wheelchair/BSC Sit to Stand: Min assist, +2 safety/equipment   Step pivot transfers: Min assist, +2 safety/equipment       General transfer comment: light assist to rise and stabilize, quickly fatigued in standing so step pivot to recliner; SPO2 briefly down to 88-89%    Ambulation/Gait                  Stairs            Wheelchair Mobility     Tilt Bed    Modified Rankin (Stroke Patients Only)       Balance Overall balance assessment: Needs assistance         Standing balance support: Bilateral upper extremity supported, Reliant on assistive device for balance, During functional activity Standing balance-Leahy Scale: Poor                               Pertinent Vitals/Pain Pain Assessment Pain Assessment: No/denies pain    Home Living Family/patient expects to be discharged to:: Private residence Living Arrangements: Spouse/significant other Available Help at Discharge: Family;Available 24 hours/day Type of Home: House Home Access: Stairs to enter Entrance Stairs-Rails: Right Entrance Stairs-Number of Steps: 2   Home Layout: One  level Home Equipment: Agricultural consultant (2 wheels)      Prior Function Prior Level of Function : Independent/Modified Independent             Mobility Comments: reports independent around home       Extremity/Trunk Assessment        Lower Extremity Assessment Lower Extremity Assessment: Generalized weakness    Cervical / Trunk Assessment Cervical / Trunk Assessment: Normal  Communication   Communication Communication: No apparent difficulties  Cognition Arousal:  Alert Behavior During Therapy: WFL for tasks assessed/performed Overall Cognitive Status: Within Functional Limits for tasks assessed                                          General Comments      Exercises     Assessment/Plan    PT Assessment Patient needs continued PT services  PT Problem List Decreased strength;Decreased balance;Decreased mobility;Decreased activity tolerance;Decreased knowledge of use of DME;Cardiopulmonary status limiting activity       PT Treatment Interventions DME instruction;Gait training;Balance training;Stair training;Functional mobility training;Therapeutic activities;Therapeutic exercise;Patient/family education    PT Goals (Current goals can be found in the Care Plan section)  Acute Rehab PT Goals PT Goal Formulation: With patient Time For Goal Achievement: 08/25/23 Potential to Achieve Goals: Good    Frequency Min 1X/week     Co-evaluation               AM-PAC PT "6 Clicks" Mobility  Outcome Measure Help needed turning from your back to your side while in a flat bed without using bedrails?: A Little Help needed moving from lying on your back to sitting on the side of a flat bed without using bedrails?: A Little Help needed moving to and from a bed to a chair (including a wheelchair)?: A Little Help needed standing up from a chair using your arms (e.g., wheelchair or bedside chair)?: A Little Help needed to walk in hospital room?: A Lot Help needed climbing 3-5 steps with a railing? : A Lot 6 Click Score: 16    End of Session Equipment Utilized During Treatment: Gait belt;Oxygen Activity Tolerance: Patient tolerated treatment well Patient left: in chair;with call bell/phone within reach;Other (comment) (no chair alarm box in room, RN aware, pt to call for assist) Nurse Communication: Mobility status PT Visit Diagnosis: Difficulty in walking, not elsewhere classified (R26.2)    Time: 2725-3664 PT Time Calculation  (min) (ACUTE ONLY): 16 min   Charges:   PT Evaluation $PT Eval Low Complexity: 1 Low   PT General Charges $$ ACUTE PT VISIT: 1 Visit        Thomasene Mohair PT, DPT Physical Therapist Acute Rehabilitation Services Office: [phone]   Janan Halter [place] 08/10/2023, 12:16 PM

## 2023-08-10 NOTE — Progress Notes (Signed)
NAME:  Luis Brown, MRN:  [id number], DOB:  Oct 12, 1945, LOS: 7 ADMISSION DATE:  08/03/2023, CONSULTATION DATE:  08/07/23 REFERRING MD:  TRH, CHIEF COMPLAINT:  acute on chronic hypoxic resp failure   History of Present Illness:  78 yo male presented a day ago with worsening sob. Pt was found to be worsning hypoxia not tolerant of NRB or NIV so was placed on hhfnc which has been sufficient thus far. However pt's mentation continues to decline, he is oriented only to self at this time. Family is en route. I have attempted to discuss with him code status but his mentation is prohibitive of this.   Pt is currently agitated pulling at lines, pulling at oxygen tubing despite mittens bilaterally. Saying " I just want to get home"  UPDATE:  D/w wife pt would never want cpr or intubation. Pt made DNR/DNI   Pertinent  Medical History  Eosinophilic penumonitis,  ILD Pulm fibrosis Copd -acute exacerbation,  Crhonic hypoxic resp failure Depression Anxiety Hyperlipidemia H/o pe with dvt  Significant Hospital Events: Including procedures, antibiotic start and stop dates in addition to other pertinent events   1/16 Admitted to hospital   1/20 with rapid decline req ccm consultation for resp distress. Added treatment for stenotrophamonas. Made DNR 1/21 better post adding rx for steno  1/22 HHFNC wt 55L/min this am 1/23 Unable to sleep last night, remains on 60L HHFNC   Interim History / Subjective:  Continues to report he feels very poor and has not been able to sleep. Ask for home Ambien to be resumed   Objective   Blood pressure (!) 165/71, pulse (!) 105, temperature 99.1 F (37.3 C), temperature source Oral, resp. rate (!) 25, height 5\' 9"  (1.753 m), weight 90 kg, SpO2 99%.    FiO2 (%):  [50 %-60 %] 60 %   Intake/Output Summary (Last 24 hours) at 08/10/2023 1013 Last data filed at 08/10/2023 4098 Gross per 24 hour  Intake 250.15 ml  Output 1875 ml  Net -1624.85 ml   Filed Weights    08/03/23 1857  Weight: 90 kg    Examination: General: Acute on chronically ill appearing elderly male lying in bed on HHFNC, in NAD HEENT: [place]/AT, MM pink/moist, PERRL,  Neuro: Alert and oriented x3, non-focal  CV: s1s2 regular rate and rhythm, no murmur, rubs, or gallops,  PULM:  Diminished bilaterally, no increased work of breathing, on 60L HHFNC GI: soft, bowel sounds active in all 4 quadrants, non-tender, non-distended, tolerating oral iet  Extremities: warm/dry, no edema  Skin: no rashes or lesions  Resolved Hospital Problem list     Assessment & Plan:  Acute on chronic hypoxic resp failure ILD/IPF COPD HFpEF RSV pna Staph pna Stenotrophamonas pna Volume overload  Acute metabolic encephalopathy Hyperglycemia  Anemia  Pulm/ccm prob list Acute on chronic hypoxic resp failure secondary to RSV viral PNA complicated further by secondary staph and stenotrophamonas PNA superimposed on Pulm fibrosis/ILD and COPD -had been treated for staph, started bactrim 1/20 then levaquin as the stenotrophamonas was resistant to bactrim 1/21. Clinically improved  P: Continue Ancef, Levaquin, and Stenotrphamomas x7 days  Wean supplemental oxygen as able  Continue Brovana, Pulmicort, and Yupelri Goal of euvolemia, net negative 9L  Encourage pulmonary hygiene  Mobilize as able Continue steroids   Best Practice (right click and "Reselect all SmartList Selections" daily)   Diet/type: Regular consistency (see orders) DVT prophylaxis LMWH Pressure ulcer(s): N/A GI prophylaxis: H2B Lines: N/A Foley:  N/A Code Status:  DNR Last date of multidisciplinary goals of care discussion changed to dnr/dni after discussion with family regarding his acute on chronic condition]  Critical care time: NA  Jeremie Giangrande D. Harris, NP-C [facility] Personal contact information can be found on Amion  If no contact or response made please call [phone] 08/10/2023, 10:13 AM

## 2023-08-10 NOTE — Progress Notes (Signed)
PHARMACY - ANTICOAGULATION CONSULT NOTE  Pharmacy Consult for Warfarin Indication: atrial fibrillation and hx DVT  Allergies  Allergen Reactions   Daptomycin     Drug induced pneumonia    Diltiazem Other (See Comments)    Passed out   Ofev [Nintedanib] Diarrhea    fatigue   Pirfenidone Diarrhea    fatigue    Patient Measurements: Height: 5\' 9"  (175.3 cm) Weight: 90 kg (198 lb 6.6 oz) IBW/kg (Calculated) : 70.7 Heparin Dosing Weight:   Vital Signs: Temp: 99.1 F (37.3 C) (01/23 0800) Temp Source: Oral (01/23 0800) BP: 157/79 (01/23 0800) Pulse Rate: 106 (01/23 0800)  Labs: Recent Labs    08/08/23 0306 08/09/23 0316 08/10/23 0308  HGB 11.9* 12.9*  --   HCT 35.9* 40.5  --   PLT 332 459*  --   LABPROT 30.8* 26.1* 19.6*  INR 2.9* 2.4* 1.6*  CREATININE 0.50* 0.43* 0.47*    Estimated Creatinine Clearance: 83 mL/min (A) (by C-G formula based on SCr of 0.47 mg/dL (L)).   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   Esophageal reflux 02/10/2015   Fatigue    HTN (hypertension)    Hyperglycemia    preDM    Hyperlipidemia    Insomnia    Low back pain radiating to both legs 01/16/2017   Measles as a child   Overweight(278.02)    Peripheral neuropathy 08/07/2019   Personal history of colonic polyps 10/27/2012   Follows with [facility]   Pneumonia    " walking"   Preventative health care    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   Wears glasses     Medications:  Scheduled:   arformoterol  15 mcg  Nebulization BID   budesonide (PULMICORT) nebulizer solution  0.5 mg Nebulization BID   busPIRone  15 mg Oral TID   Chlorhexidine Gluconate Cloth  6 each Topical Daily   dextromethorphan-guaiFENesin  2 tablet Oral BID   enoxaparin (LOVENOX) injection  90 mg Subcutaneous Q12H   insulin aspart  0-15 Units Subcutaneous Q4H   insulin glargine-yfgn  10 Units Subcutaneous Daily   losartan  50 mg Oral Daily   methylPREDNISolone (SOLU-MEDROL) injection  40 mg Intravenous Daily   minocycline  200 mg Oral BID   pregabalin  75 mg Oral BID   revefenacin  175 mcg Nebulization Daily   tamsulosin  0.8 mg Oral QPM   venlafaxine XR  150 mg Oral Q breakfast   zolpidem  5 mg Oral QHS   Infusions:    ceFAZolin (ANCEF) IV Stopped (08/10/23 0510)   levofloxacin (LEVAQUIN) IV Stopped (08/09/23 1555)   PTA warfarin: 5mg  daily except 7.5 mg on Tuesdays and Saturdays.   Assessment: 57 yoM admitted on 1/16 with acute on chronic hypoxic resp failure.  Home warfarin was not continued d/t NPO status and was transitioned to Enoxaparin. Pharmacy is now consulted to resume warfarin dosing.   INR 1.6  CBC: Hgb 12.9, Plt elevated Noted significant DDI:  Levaquin, minocycline    Goal of Therapy:  INR 2-3 Monitor platelets by anticoagulation protocol: Yes   Plan:  Continue Lovenox until INR is therapeutic.  Warfarin 2.5 mg PO x 1 Daily PT/INR. Monitor for signs and symptoms of bleeding.  Lynann Beaver PharmD, BCPS [facility] main pharmacy [phone] 08/10/2023 9:59 AM

## 2023-08-11 DIAGNOSIS — J9621 Acute and chronic respiratory failure with hypoxia: Secondary | ICD-10-CM | POA: Diagnosis not present

## 2023-08-11 DIAGNOSIS — R579 Shock, unspecified: Secondary | ICD-10-CM

## 2023-08-11 DIAGNOSIS — J189 Pneumonia, unspecified organism: Secondary | ICD-10-CM | POA: Diagnosis not present

## 2023-08-11 LAB — GLUCOSE, CAPILLARY
Glucose-Capillary: 184 mg/dL — ABNORMAL HIGH (ref 70–99)
Glucose-Capillary: 181 mg/dL — ABNORMAL HIGH (ref 70–99)
Glucose-Capillary: 218 mg/dL — ABNORMAL HIGH (ref 70–99)
Glucose-Capillary: 251 mg/dL — ABNORMAL HIGH (ref 70–99)
Glucose-Capillary: 264 mg/dL — ABNORMAL HIGH (ref 70–99)

## 2023-08-11 LAB — CBC
HCT: 36.4 % — ABNORMAL LOW (ref 39.0–52.0)
Hemoglobin: 11.4 g/dL — ABNORMAL LOW (ref 13.0–17.0)
MCH: 28.5 pg (ref 26.0–34.0)
MCHC: 31.3 g/dL (ref 30.0–36.0)
MCV: 91 fL (ref 80.0–100.0)
Platelets: 327 10*3/uL (ref 150–400)
RBC: 4 MIL/uL — ABNORMAL LOW (ref 4.22–5.81)
RDW: 14.9 % (ref 11.5–15.5)
WBC: 10.1 10*3/uL (ref 4.0–10.5)
nRBC: 0 % (ref 0.0–0.2)

## 2023-08-11 LAB — BASIC METABOLIC PANEL
Anion gap: 9 (ref 5–15)
BUN: 24 mg/dL — ABNORMAL HIGH (ref 8–23)
CO2: 28 mmol/L (ref 22–32)
Calcium: 8 mg/dL — ABNORMAL LOW (ref 8.9–10.3)
Chloride: 97 mmol/L — ABNORMAL LOW (ref 98–111)
Creatinine, Ser: 0.45 mg/dL — ABNORMAL LOW (ref 0.61–1.24)
GFR, Estimated: >60 mL/min (ref >60)
Glucose, Bld: 191 mg/dL — ABNORMAL HIGH (ref 70–99)
Potassium: 3.9 mmol/L (ref 3.5–5.1)
Sodium: 134 mmol/L — ABNORMAL LOW (ref 135–145)

## 2023-08-11 LAB — PROTIME-INR
INR: 1.4 — ABNORMAL HIGH (ref 0.8–1.2)
Prothrombin Time: 17.1 s — ABNORMAL HIGH (ref 11.4–15.2)

## 2023-08-11 MED ORDER — INSULIN ASPART 100 UNIT/ML IJ SOLN
0.0000 [IU] | Freq: Every day | INTRAMUSCULAR | Status: DC
  Administered 2023-08-11: 3 [IU] via SUBCUTANEOUS
  Administered 2023-08-14 – 2023-08-17 (×3): 2 [IU] via SUBCUTANEOUS

## 2023-08-11 MED ORDER — PREDNISONE 20 MG PO TABS
40.0000 mg | ORAL_TABLET | Freq: Every day | ORAL | Status: DC
  Administered 2023-08-12 – 2023-08-14 (×3): 40 mg via ORAL
  Filled 2023-08-11 (×3): qty 2

## 2023-08-11 MED ORDER — INSULIN ASPART 100 UNIT/ML IJ SOLN
0.0000 [IU] | Freq: Three times a day (TID) | INTRAMUSCULAR | Status: DC
  Administered 2023-08-11: 11 [IU] via SUBCUTANEOUS
  Administered 2023-08-11: 7 [IU] via SUBCUTANEOUS
  Administered 2023-08-12: 15 [IU] via SUBCUTANEOUS
  Administered 2023-08-12: 11 [IU] via SUBCUTANEOUS
  Administered 2023-08-12 – 2023-08-14 (×5): 7 [IU] via SUBCUTANEOUS
  Administered 2023-08-14: 11 [IU] via SUBCUTANEOUS
  Administered 2023-08-14: 7 [IU] via SUBCUTANEOUS
  Administered 2023-08-15: 4 [IU] via SUBCUTANEOUS
  Administered 2023-08-15 (×2): 7 [IU] via SUBCUTANEOUS
  Administered 2023-08-16: 4 [IU] via SUBCUTANEOUS
  Administered 2023-08-16: 11 [IU] via SUBCUTANEOUS
  Administered 2023-08-16 – 2023-08-17 (×2): 7 [IU] via SUBCUTANEOUS
  Administered 2023-08-17: 4 [IU] via SUBCUTANEOUS
  Administered 2023-08-17: 15 [IU] via SUBCUTANEOUS
  Administered 2023-08-18: 4 [IU] via SUBCUTANEOUS
  Administered 2023-08-18: 15 [IU] via SUBCUTANEOUS
  Administered 2023-08-18: 7 [IU] via SUBCUTANEOUS
  Administered 2023-08-19 (×2): 15 [IU] via SUBCUTANEOUS
  Administered 2023-08-19: 4 [IU] via SUBCUTANEOUS
  Administered 2023-08-20: 3 [IU] via SUBCUTANEOUS

## 2023-08-11 MED ORDER — WARFARIN SODIUM 2.5 MG PO TABS
2.5000 mg | ORAL_TABLET | Freq: Once | ORAL | Status: AC
  Administered 2023-08-11: 2.5 mg via ORAL
  Filled 2023-08-11: qty 1

## 2023-08-11 NOTE — Progress Notes (Signed)
NAME:  Luis Brown, MRN:  [id number], DOB:  08-Jun-1946, LOS: 8 ADMISSION DATE:  08/03/2023, CONSULTATION DATE:  08/07/23 REFERRING MD:  TRH, CHIEF COMPLAINT:  acute on chronic hypoxic resp failure   History of Present Illness:  78 yo male presented a day ago with worsening sob. Pt was found to be worsning hypoxia not tolerant of NRB or NIV so was placed on hhfnc which has been sufficient thus far. However pt's mentation continues to decline, he is oriented only to self at this time. Family is en route. I have attempted to discuss with him code status but his mentation is prohibitive of this.   Pt is currently agitated pulling at lines, pulling at oxygen tubing despite mittens bilaterally. Saying " I just want to get home"  UPDATE:  D/w wife pt would never want cpr or intubation. Pt made DNR/DNI   Pertinent  Medical History  Eosinophilic penumonitis,  ILD Pulm fibrosis Copd -acute exacerbation,  Crhonic hypoxic resp failure Depression Anxiety Hyperlipidemia H/o pe with dvt  Significant Hospital Events: Including procedures, antibiotic start and stop dates in addition to other pertinent events   1/16 Admitted to hospital   1/20 with rapid decline req ccm consultation for resp distress. Added treatment for stenotrophamonas. Made DNR 1/21 better post adding rx for steno  1/22 HHFNC wt 55L/min this am 1/23 Unable to sleep last night, remains on 60L HHFNC   Interim History / Subjective:  Feels better   Objective   Blood pressure (!) 85/38, pulse (!) 57, temperature 98.7 F (37.1 C), temperature source Oral, resp. rate 19, height 5\' 9"  (1.753 m), weight 90 kg, SpO2 100%.    FiO2 (%):  [50 %-60 %] 50 %   Intake/Output Summary (Last 24 hours) at 08/11/2023 0719 Last data filed at 08/11/2023 6295 Gross per 24 hour  Intake 544.7 ml  Output 2950 ml  Net -2405.3 ml   Filed Weights   08/03/23 1857  Weight: 90 kg    Examination:  General this is a 78 year old male sitting up  in bed. He is in no distress HENT NCAT sclera not icteric no JVD Pulm bibasilar rales. Speaking today in full sentences. Still on about 56% heated high flow but WOB improved Card rrr no MRG Abd soft Ext warm no sig edema brisk CR Neuro intact   Resolved Hospital Problem list   Volume overload Staph pna Acute metabolic encephalopathy  Assessment & Plan:  Acute on chronic hypoxic resp failure ILD/IPF COPD HFpEF RSV pna Stenotrophamonas pna Atrial Fib on AC  Hyperglycemia  Anemia  Pulm/ccm prob list Acute on chronic hypoxic resp failure secondary to RSV viral PNA complicated further by secondary staph and stenotrophamonas PNA superimposed on Pulm fibrosis/ILD and COPD -started on Levaquin 1/20 w/ slow clinical improvement. Since that time systemic steroids, abx, BDs and diuresis have been therapeutic interventions. Completed ancef. Wbc improving.  Plan Today is day 4 of levaquin and minocycline for stenotrophamonas; planning on 7 d course  Cont to wean O2 sat goal > 88% Cont Brovana, Pulmicort, and Yupelri Goal of euvolemia, (now 12 liters neg total) hold off on diuretics today  Encourage pulmonary hygiene  Mobilize as able Change solumedrol to pred 40  Hypotension  12 liters neg. We had been aggressively diuresing. Got crystalloid last night (500 ml) then started on norepi infusion  Plan Hold cozaar Hold diuretics today let him re-equilibrate  Will hold off on more fluid for now Wean NE for SBP >  90  Hyperglycemia Plan SSI, chang to tid &HS w/ resistant scale.  meal time coverage 4units Semglee 10 units   Best Practice (right click and "Reselect all SmartList Selections" daily)   Diet/type: Regular consistency (see orders) DVT prophylaxis LMWH Pressure ulcer(s): N/A GI prophylaxis: H2B Lines: N/A Foley:  N/A Code Status:  DNR Last date of multidisciplinary goals of care discussion changed to dnr/dni after discussion with family regarding his acute on chronic  condition]  Critical care time: 32 min

## 2023-08-11 NOTE — Progress Notes (Signed)
PROGRESS NOTE    Luis Brown  GBT:[id number] DOB: 09-May-1946 DOA: 08/03/2023 PCP: Bradd Canary, MD    Brief Narrative:  78 yo male with PMH eosinophilic pneumonitis, ILD, pulmonary fibrosis, COPD, chronic hypoxia on 2 L O2 at home, PAF, history of PE and DVT on Coumadin, GERD, malnutrition, HLD, anxiety/depression, PMR who presented with cough, congestion, and worsening shortness of breath. He was tachycardic, tachypneic, and had leukocytosis on admission. CXR showed diffuse interstitial opacities.  Respiratory swabs were positive for RSV. He was started on steroids, nebs, and antibiotics.  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   Paroxysmal atrial fibrillation (HCC)   Hyponatremia   Insomnia   BPH (benign prostatic hyperplasia)   GERD (gastroesophageal reflux disease)   History of DVT (deep vein thrombosis)   Hyperlipidemia   Pulmonary fibrosis (HCC)   History of pulmonary embolism   Hypochloremia   Generalized anxiety disorder   Insulin dependent type 2 diabetes mellitus (HCC)   RSV (respiratory syncytial virus pneumonia)   #1 sepsis secondary to viral RSV pneumonia superimposed with MSSA and stenotrophomonas pneumonia in the setting of interstitial lung disease his O2 requirement is still very high at 35 L heated high flow nasal cannula though improving Sputum culture grew stenotrophomonas and Staph aureus  Currently on Levaquin and minocycline for stenotrophomonas and MSSA Ancef stopped 1/23 Leukocytosis resolved  #2 interstitial lung disease in the setting of COPD and pulmonary fibrosis PCCM following appreciate input continue steroids nebs Brovana Pulmicort and Yupelri   #3 history of DVT and pulmonary embolism patient feels Xarelto and developed DVT while on  Xarelto currently on Lovenox  #4 paroxysmal atrial fibrillation continue coumadin  #5 type 2 diabetes A1c 8.8 increase Semglee 12 units  and NovoLog 4 units 3 times daily CBG (last 3)  Recent Labs    08/10/23 2354 08/11/23 0305 08/11/23 0813  GLUCAP 212* 184* 181*    #6 hyperlipidemia not on any medications but has been controlled with last LDL of 76 triglycerides 110  #7 BPH continue Flomax  #8 generalized anxiety disorder and insomnia on Effexor and Wellbutrin  #9 peripheral neuropathy on Lyrica and Zanaflex  #10 history of diastolic heart failure has been getting as needed Lasix   #11 CODE STATUS patient DNR  #12 hyponatremia resolved  #13 hypokalemia repleted  #14 insomnia restart Ambien when he was taking at home 10 mg nightly  #15 hypotension patient was being diuresed aggressively diuresis held Cozaar held A-line placed overnight with PCCM started  Levophed  Estimated body mass index is 29.3 kg/m as calculated from the following:   Height as of this encounter: 5\' 9"  (1.753 m).   Weight as of this encounter: 90 kg.  DVT prophylaxis: Lovenox Code Status: DN R Family Communication: None at bedside  disposition Plan:  Status is: Inpatient Remains inpatient appropriate because: Acute illness   Consultants:  PCCM  Procedures: None Antimicrobials: Levaquin Ancef and minocycline  Subjective: Slept well last night after a long number of nights of no sleep pain improved with changing Percocet to standing dose which she was taking at home instead of as needed Blood pressure low last night A-line placed and started nor epi objective: Vitals:   08/11/23 0900 08/11/23 0915 08/11/23 0930 08/11/23 1026  BP: (!) 90/43  (!) 109/33 Marland Kitchen)  143/60  Pulse: (!) 56 70 60   Resp: 19 (!) 25 (!) 26   Temp:      TempSrc:      SpO2: 99% 92% 96% 95%  Weight:      Height:        Intake/Output Summary (Last 24 hours) at 08/11/2023 1112 Last data filed at 08/11/2023 1056 Gross  per 24 hour  Intake 659.74 ml  Output 2950 ml  Net -2290.26 ml   Filed Weights   08/03/23 1857  Weight: 90 kg    Examination:  General exam: Appears in mild distress  respiratory system: rhonchi Cardiovascular system: Tachy gastrointestinal system: Abdomen is nondistended, soft and nontender. No organomegaly or masses felt. Normal bowel sounds heard. Central nervous system: Alert and oriented. No focal neurological deficits. Extremities: No edema   Data Reviewed: I have personally reviewed following labs and imaging studies  CBC: Recent Labs  Lab 08/05/23 0237 08/06/23 0242 08/07/23 0231 08/08/23 0306 08/09/23 0316 08/11/23 0319  WBC 10.9* 11.4* 12.6* 10.1 15.3* 10.1  NEUTROABS 9.9* 10.5* 11.6* 9.3* 13.7*  --   HGB 10.3* 11.0* 12.6* 11.9* 12.9* 11.4*  HCT 32.4* 33.8* 39.2 35.9* 40.5 36.4*  MCV 88.3 89.9 87.3 86.9 91.0 91.0  PLT 331 345 383 332 459* 327   Basic Metabolic Panel: Recent Labs  Lab 08/06/23 0242 08/07/23 0231 08/08/23 0306 08/09/23 0316 08/10/23 0308 08/11/23 0319  NA  --  133* 135 135 136 134*  K  --  3.3* 3.9 3.5 3.3* 3.9  CL  --  95* 98 96* 96* 97*  CO2  --  26 28 27 29 28   GLUCOSE  --  161* 244* 123* 182* 191*  BUN  --  25* 25* 28* 21 24*  CREATININE  --  0.47* 0.50* 0.43* 0.47* 0.45*  CALCIUM  --  8.4* 8.5* 8.3* 8.3* 8.0*  MG 2.1 2.0 2.2 2.0 2.1  --    GFR: Estimated Creatinine Clearance: 83 mL/min (A) (by C-G formula based on SCr of 0.45 mg/dL (L)). Liver Function Tests: Recent Labs  Lab 08/05/23 0237  AST 22  ALT 14  ALKPHOS 56  BILITOT 0.5  PROT 5.3*  ALBUMIN 2.8*   No results for input(s): "LIPASE", "AMYLASE" in the last 168 hours. No results for input(s): "AMMONIA" in the last 168 hours. Coagulation Profile: Recent Labs  Lab 08/07/23 0231 08/08/23 0306 08/09/23 0316 08/10/23 0308 08/11/23 0319  INR 1.8* 2.9* 2.4* 1.6* 1.4*   Cardiac Enzymes: No results for input(s): "CKTOTAL", "CKMB", "CKMBINDEX", "TROPONINI" in  the last 168 hours. BNP (last 3 results) Recent Labs    05/31/23 1534  PROBNP 40.0   HbA1C: No results for input(s): "HGBA1C" in the last 72 hours. CBG: Recent Labs  Lab 08/10/23 1602 08/10/23 2102 08/10/23 2354 08/11/23 0305 08/11/23 0813  GLUCAP 330* 271* 212* 184* 181*   Lipid Profile: No results for input(s): "CHOL", "HDL", "LDLCALC", "TRIG", "CHOLHDL", "LDLDIRECT" in the last 72 hours. Thyroid Function Tests: No results for input(s): "TSH", "T4TOTAL", "FREET4", "T3FREE", "THYROIDAB" in the last 72 hours. Anemia Panel: No results for input(s): "VITAMINB12", "FOLATE", "FERRITIN", "TIBC", "IRON", "RETICCTPCT" in the last 72 hours. Sepsis Labs: No results for input(s): "PROCALCITON", "LATICACIDVEN" in the last 168 hours.   Recent Results (from the past 240 hours)  Culture, blood (routine x 2)     Status: None   Collection Time: 08/03/23  9:00 PM   Specimen: BLOOD RIGHT ARM  Result Value Ref Range Status  Specimen Description   Final    BLOOD RIGHT ARM Performed at [facility], [address], [place], [place] [address]    Special Requests   Final    BOTTLES DRAWN AEROBIC AND ANAEROBIC Blood Culture results may not be optimal due to an inadequate volume of blood received in culture bottles Performed at [facility], [address] [place], [place] [address]    Culture   Final    NO GROWTH 5 DAYS Performed at [facility], [address], [place], [place] [address]    Report Status 08/08/2023 FINAL  Final  Culture, blood (routine x 2)     Status: None   Collection Time: 08/03/23  9:10 PM   Specimen: BLOOD  Result Value Ref Range Status   Specimen Description   Final    BLOOD RIGHT ANTECUBITAL Performed at [facility], [address] [place], [place] [address]    Special Requests   Final    BOTTLES DRAWN AEROBIC AND ANAEROBIC Blood Culture results may not be optimal due to an inadequate volume of blood  received in culture bottles Performed at [facility], [address], [place], [place] [address]    Culture   Final    NO GROWTH 5 DAYS Performed at [facility], [address] [place], [place] [address]    Report Status 08/08/2023 FINAL  Final  Respiratory (~20 pathogens) panel by PCR     Status: Abnormal   Collection Time: 08/04/23  1:48 AM   Specimen: Nasopharyngeal Swab; Respiratory  Result Value Ref Range Status   Adenovirus NOT DETECTED NOT DETECTED Final   Coronavirus 229E NOT DETECTED NOT DETECTED Final    Comment: (NOTE) The Coronavirus on the Respiratory Panel, DOES NOT test for the novel  Coronavirus (2019 nCoV)    Coronavirus HKU1 NOT DETECTED NOT DETECTED Final   Coronavirus NL63 NOT DETECTED NOT DETECTED Final   Coronavirus OC43 NOT DETECTED NOT DETECTED Final   Metapneumovirus NOT DETECTED NOT DETECTED Final   Rhinovirus / Enterovirus NOT DETECTED NOT DETECTED Final   Influenza A NOT DETECTED NOT DETECTED Final   Influenza B NOT DETECTED NOT DETECTED Final   Parainfluenza Virus 1 NOT DETECTED NOT DETECTED Final   Parainfluenza Virus 2 NOT DETECTED NOT DETECTED Final   Parainfluenza Virus 3 NOT DETECTED NOT DETECTED Final   Parainfluenza Virus 4 NOT DETECTED NOT DETECTED Final   Respiratory Syncytial Virus DETECTED (A) NOT DETECTED Final   Bordetella pertussis NOT DETECTED NOT DETECTED Final   Bordetella Parapertussis NOT DETECTED NOT DETECTED Final   Chlamydophila pneumoniae NOT DETECTED NOT DETECTED Final   Mycoplasma pneumoniae NOT DETECTED NOT DETECTED Final    Comment: Performed at [facility], [address] [place], [place] [address]  Expectorated Sputum Assessment w Gram Stain, Rflx to Resp Cult     Status: None   Collection Time: 08/04/23  8:49 AM   Specimen: Expectorated Sputum  Result Value Ref Range Status   Specimen Description EXPECTORATED SPUTUM  Final   Special Requests Immunocompromised  Final   Sputum  evaluation   Final    THIS SPECIMEN IS ACCEPTABLE FOR SPUTUM CULTURE Performed at [facility], [address] [place], [place] [address]    Report Status 08/05/2023 FINAL  Final  MRSA Next Gen by PCR, Nasal     Status: None   Collection Time: 08/04/23  8:49 AM   Specimen:  Nasal Mucosa; Nasal Swab  Result Value Ref Range Status   MRSA by PCR Next Gen NOT DETECTED NOT DETECTED Final    Comment: (NOTE) The GeneXpert MRSA Assay (FDA approved for NASAL specimens only), is one component of a comprehensive MRSA colonization surveillance program. It is not intended to diagnose MRSA infection nor to guide or monitor treatment for MRSA infections. Test performance is not FDA approved in patients less than 46 years old. Performed at [facility], [address], [place], [place] [address]   Culture, Respiratory w Gram Stain     Status: None   Collection Time: 08/04/23  8:49 AM  Result Value Ref Range Status   Specimen Description   Final    EXPECTORATED SPUTUM Performed at [facility], [address], [place], [place] [address]    Special Requests   Final    Immunocompromised Reflexed from [id number] Performed at [facility], [address] [place], [place] [address]    Gram Stain   Final    NO WBC SEEN FEW GRAM POSITIVE COCCI Performed at [facility], [address], [place], [place] [address]    Culture   Final    ABUNDANT STAPHYLOCOCCUS AUREUS ABUNDANT STENOTROPHOMONAS MALTOPHILIA    Report Status 08/07/2023 FINAL  Final   Organism ID, Bacteria STAPHYLOCOCCUS AUREUS  Final   Organism ID, Bacteria STENOTROPHOMONAS MALTOPHILIA  Final      Susceptibility   Staphylococcus aureus - MIC*    CIPROFLOXACIN <=0.5 SENSITIVE Sensitive     ERYTHROMYCIN <=0.25 SENSITIVE Sensitive     GENTAMICIN <=0.5 SENSITIVE Sensitive     OXACILLIN 0.5 SENSITIVE Sensitive     TETRACYCLINE <=1 SENSITIVE Sensitive      VANCOMYCIN 1 SENSITIVE Sensitive     TRIMETH/SULFA <=10 SENSITIVE Sensitive     CLINDAMYCIN <=0.25 SENSITIVE Sensitive     RIFAMPIN <=0.5 SENSITIVE Sensitive     Inducible Clindamycin NEGATIVE Sensitive     LINEZOLID 2 SENSITIVE Sensitive     * ABUNDANT STAPHYLOCOCCUS AUREUS   Stenotrophomonas maltophilia - MIC*    LEVOFLOXACIN 2 SENSITIVE Sensitive     TRIMETH/SULFA 80 RESISTANT Resistant     * ABUNDANT STENOTROPHOMONAS MALTOPHILIA         Radiology Studies: No results found.   Scheduled Meds:  arformoterol  15 mcg Nebulization BID   budesonide (PULMICORT) nebulizer solution  0.5 mg Nebulization BID   busPIRone  15 mg Oral TID   Chlorhexidine Gluconate Cloth  6 each Topical Daily   dextromethorphan-guaiFENesin  2 tablet Oral BID   enoxaparin (LOVENOX) injection  90 mg Subcutaneous Q12H   insulin aspart  0-20 Units Subcutaneous TID WC   insulin aspart  0-5 Units Subcutaneous QHS   insulin aspart  4 Units Subcutaneous TID WC   insulin glargine-yfgn  10 Units Subcutaneous Daily   minocycline  200 mg Oral BID   oxyCODONE-acetaminophen  1 tablet Oral Q4H   [START ON 08/12/2023] predniSONE  40 mg Oral Q breakfast   pregabalin  75 mg Oral BID   revefenacin  175 mcg Nebulization Daily   tamsulosin  0.8 mg Oral QPM   tiZANidine  6 mg Oral Q4H   venlafaxine XR  150 mg Oral Q breakfast   warfarin  2.5 mg Oral ONCE-1600   Warfarin - Pharmacist Dosing Inpatient   Does not apply q1600   zolpidem  5 mg Oral QHS   Continuous Infusions:  sodium chloride Stopped (08/10/23 2204)  levofloxacin (LEVAQUIN) IV Stopped (08/10/23 1751)   norepinephrine (LEVOPHED) Adult infusion 2 mcg/min (08/11/23 1056)     LOS: 8 days    Time spent: 39 min Alwyn Ren, MD 08/11/2023, 11:12 AM

## 2023-08-11 NOTE — Progress Notes (Signed)
PHARMACY - ANTICOAGULATION CONSULT NOTE  Pharmacy Consult for Warfarin Indication: atrial fibrillation and hx DVT  Allergies  Allergen Reactions   Daptomycin     Drug induced pneumonia    Diltiazem Other (See Comments)    Passed out   Ofev [Nintedanib] Diarrhea    fatigue   Pirfenidone Diarrhea    fatigue    Patient Measurements: Height: 5\' 9"  (175.3 cm) Weight: 90 kg (198 lb 6.6 oz) IBW/kg (Calculated) : 70.7  Vital Signs: Temp: 98.7 F (37.1 C) (01/24 0307) Temp Source: Oral (01/24 0307) BP: 85/38 (01/23 2350) Pulse Rate: 57 (01/24 0520)  Labs: Recent Labs    08/09/23 0316 08/10/23 0308 08/11/23 0319  HGB 12.9*  --  11.4*  HCT 40.5  --  36.4*  PLT 459*  --  327  LABPROT 26.1* 19.6* 17.1*  INR 2.4* 1.6* 1.4*  CREATININE 0.43* 0.47* 0.45*    Estimated Creatinine Clearance: 83 mL/min (A) (by C-G formula based on SCr of 0.45 mg/dL (L)).   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   Esophageal reflux 02/10/2015   Fatigue    HTN (hypertension)    Hyperglycemia    preDM    Hyperlipidemia    Insomnia    Low back pain radiating to both legs 01/16/2017   Measles as a child   Overweight(278.02)    Peripheral neuropathy 08/07/2019   Personal history of colonic polyps 10/27/2012   Follows with [facility]   Pneumonia    " walking"   Preventative health care    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   Wears glasses     Medications:  Scheduled:   arformoterol  15 mcg Nebulization BID   budesonide  (PULMICORT) nebulizer solution  0.5 mg Nebulization BID   busPIRone  15 mg Oral TID   Chlorhexidine Gluconate Cloth  6 each Topical Daily   dextromethorphan-guaiFENesin  2 tablet Oral BID   enoxaparin (LOVENOX) injection  90 mg Subcutaneous Q12H   insulin aspart  0-15 Units Subcutaneous Q4H   insulin aspart  4 Units Subcutaneous TID WC   insulin glargine-yfgn  10 Units Subcutaneous Daily   methylPREDNISolone (SOLU-MEDROL) injection  40 mg Intravenous Daily   minocycline  200 mg Oral BID   oxyCODONE-acetaminophen  1 tablet Oral Q4H   pregabalin  75 mg Oral BID   revefenacin  175 mcg Nebulization Daily   tamsulosin  0.8 mg Oral QPM   tiZANidine  6 mg Oral Q4H   venlafaxine XR  150 mg Oral Q breakfast   Warfarin - Pharmacist Dosing Inpatient   Does not apply q1600   zolpidem  5 mg Oral QHS   Infusions:   sodium chloride Stopped (08/10/23 2204)   levofloxacin (LEVAQUIN) IV Stopped (08/10/23 1751)   norepinephrine (LEVOPHED) Adult infusion 5 mcg/min (08/11/23 0500)   PTA warfarin: 5mg  daily except 7.5 mg on Tuesdays and Saturdays.   Assessment: 17 yoM admitted on 1/16 with acute on chronic hypoxic resp failure.  Home warfarin was initially continued but stopped after two doses d/t NPO status >> pt was transitioned to Enoxaparin instead. Pharmacy is now consulted to resume warfarin dosing.   Today, 08/11/23: INR 1.4 - SUBtherapeutic, however, expect INR to trend up tomorrow as it takes ~48hrs to see impact on INR post-warfarin administration. CBC: Hgb 11.4, plts 327--stable. Noted significant DDI: Levaquin, minocycline  Will continue to dose warfarin conservatively given possible increase in INR with co-administration of the above abx.   Goal of Therapy:  INR 2-3 Monitor platelets by anticoagulation protocol: Yes   Plan:  Continue Lovenox 90mg  SQ q12hrs until INR is therapeutic  Warfarin 2.5 mg PO x 1 Daily PT/INR Monitor for signs and symptoms of bleeding   Cherylin Mylar,  PharmD Clinical Pharmacist  1/24/20257:45 AM

## 2023-08-12 ENCOUNTER — Inpatient Hospital Stay (HOSPITAL_COMMUNITY): Payer: Medicare Other

## 2023-08-12 DIAGNOSIS — J9621 Acute and chronic respiratory failure with hypoxia: Secondary | ICD-10-CM | POA: Diagnosis not present

## 2023-08-12 DIAGNOSIS — R579 Shock, unspecified: Secondary | ICD-10-CM | POA: Diagnosis not present

## 2023-08-12 DIAGNOSIS — J189 Pneumonia, unspecified organism: Secondary | ICD-10-CM | POA: Diagnosis not present

## 2023-08-12 LAB — PROTIME-INR
INR: 1.3 — ABNORMAL HIGH (ref 0.8–1.2)
Prothrombin Time: 16.7 s — ABNORMAL HIGH (ref 11.4–15.2)

## 2023-08-12 LAB — GLUCOSE, CAPILLARY
Glucose-Capillary: 262 mg/dL — ABNORMAL HIGH (ref 70–99)
Glucose-Capillary: 231 mg/dL — ABNORMAL HIGH (ref 70–99)
Glucose-Capillary: 291 mg/dL — ABNORMAL HIGH (ref 70–99)
Glucose-Capillary: 303 mg/dL — ABNORMAL HIGH (ref 70–99)
Glucose-Capillary: 126 mg/dL — ABNORMAL HIGH (ref 70–99)

## 2023-08-12 MED ORDER — WARFARIN SODIUM 5 MG PO TABS
5.0000 mg | ORAL_TABLET | Freq: Once | ORAL | Status: AC
  Administered 2023-08-12: 5 mg via ORAL
  Filled 2023-08-12: qty 1

## 2023-08-12 NOTE — Progress Notes (Signed)
NAME:  Luis Brown, MRN:  [id number], DOB:  1945/08/01, LOS: 9 ADMISSION DATE:  08/03/2023, CONSULTATION DATE:  08/07/23 REFERRING MD:  TRH, CHIEF COMPLAINT:  acute on chronic hypoxic resp failure   History of Present Illness:  78 yo male presented a day ago with worsening sob. Pt was found to be worsning hypoxia not tolerant of NRB or NIV so was placed on hhfnc which has been sufficient thus far. However pt's mentation continues to decline, he is oriented only to self at this time. Family is en route. I have attempted to discuss with him code status but his mentation is prohibitive of this.   Pt is currently agitated pulling at lines, pulling at oxygen tubing despite mittens bilaterally. Saying " I just want to get home"  UPDATE:  D/w wife pt would never want cpr or intubation. Pt made DNR/DNI   Pertinent  Medical History  Eosinophilic penumonitis,  ILD Pulm fibrosis Copd -acute exacerbation,  Crhonic hypoxic resp failure Depression Anxiety Hyperlipidemia H/o pe with dvt  Significant Hospital Events: Including procedures, antibiotic start and stop dates in addition to other pertinent events   1/16 Admitted to hospital   1/20 with rapid decline req ccm consultation for resp distress. Added treatment for stenotrophamonas. Made DNR 1/21 better post adding rx for steno  1/22 HHFNC wt 55L/min this am 1/23 Unable to sleep last night, remains on 60L HHFNC   Interim History / Subjective:  On 2 mcg norepi this morning. Breathing stable to improved from yesterday. On 45% and 40LPM  Objective   Blood pressure (!) 115/96, pulse (!) 112, temperature 98.5 F (36.9 C), temperature source Oral, resp. rate 19, height 5\' 9"  (1.753 m), weight 90 kg, SpO2 90%.    FiO2 (%):  [35 %-50 %] 40 %   Intake/Output Summary (Last 24 hours) at 08/12/2023 1035 Last data filed at 08/12/2023 0919 Gross per 24 hour  Intake 1046.97 ml  Output 4975 ml  Net -3928.03 ml   Filed Weights   08/03/23 1857   Weight: 90 kg    Examination:  Elderly man, on HHFNC Bibasilar crackles and occasional rhonchi Tachycardic, regular No peripheral edema Normal speech, Aox4 no focal asymmetry  Resolved Hospital Problem list   Volume overload Staph pna Acute metabolic encephalopathy  Assessment & Plan:  Acute on chronic hypoxic resp failure ILD/IPF COPD HFpEF RSV pna Stenotrophamonas pna Atrial Fib on AC  Hyperglycemia  Anemia  Pulm/ccm prob list Acute on chronic hypoxic resp failure secondary to RSV viral PNA complicated further by secondary staph and stenotrophamonas PNA superimposed on Pulm fibrosis/ILD and COPD -started on Levaquin 1/20 w/ slow clinical improvement. Since that time systemic steroids, abx, BDs and diuresis have been therapeutic interventions. Completed ancef. Wbc improving.  Plan Levaquin and minocycline for stenotrophamonas; planning on 7 d course  Prednisone 40 mg daily Cont to wean O2 sat goal > 88% Cont Brovana, Pulmicort, and Yupelri Maintain euvolemia Encourage pulmonary hygiene  Mobilize as able Going to need some more time.   Hypotension/circulatory shock Plan Holding home anti-hypertensives Holding diuresis given net negative fluid balance since admission Only on 2 mcg norepinephrine, hopefully this can come off today  Hyperglycemia Plan SSI, chang to tid &HS w/ resistant scale.  meal time coverage 4units Semglee 10 units   Best Practice (right click and "Reselect all SmartList Selections" daily)   Diet/type: Regular consistency (see orders) DVT prophylaxis LMWH Pressure ulcer(s): N/A GI prophylaxis: H2B Lines: N/A Foley:  N/A Code Status:  DNR Last date of multidisciplinary goals of care discussion [DNR/DNI previously established. Wife and husband updated at bedside 1/24]  Critical care time:    The patient is critically ill due to respiratory failure, shock.  Critical care was necessary to treat or prevent imminent or life-threatening  deterioration.  Critical care was time spent personally by me on the following activities: development of treatment plan with patient and/or surrogate as well as nursing, discussions with consultants, evaluation of patient's response to treatment, examination of patient, obtaining history from patient or surrogate, ordering and performing treatments and interventions, ordering and review of laboratory studies, ordering and review of radiographic studies, pulse oximetry, re-evaluation of patient's condition and participation in multidisciplinary rounds.   Critical Care Time devoted to patient care services described in this note is 33 minutes. This time reflects time of care of this signee Charlott Holler . This critical care time does not reflect separately billable procedures or procedure time, teaching time or supervisory time of PA/NP/Med student/Med Resident etc but could involve care discussion time.       Mickel Baas Pulmonary and Critical Care Medicine 08/12/2023 10:35 AM  Pager: see AMION  If no response to pager , please call critical care on call (see AMION) until 7pm After 7:00 pm call Elink

## 2023-08-12 NOTE — Progress Notes (Signed)
PROGRESS NOTE    Luis Brown  QBH:[id number] DOB: [birth year] DOA: 08/03/2023 PCP: Bradd Canary, MD    Brief Narrative:  78 yo male with PMH eosinophilic pneumonitis, ILD, pulmonary fibrosis, COPD, chronic hypoxia on 2 L O2 at home, PAF, history of PE and DVT on Coumadin, GERD, malnutrition, HLD, anxiety/depression, PMR who presented with cough, congestion, and worsening shortness of breath. He was tachycardic, tachypneic, and had leukocytosis on admission. CXR showed diffuse interstitial opacities.  Respiratory swabs were positive for RSV. He was started on steroids, nebs, and antibiotics.  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   Paroxysmal atrial fibrillation (HCC)   Hyponatremia   Insomnia   BPH (benign prostatic hyperplasia)   GERD (gastroesophageal reflux disease)   History of DVT (deep vein thrombosis)   Hyperlipidemia   Pulmonary fibrosis (HCC)   History of pulmonary embolism   Hypochloremia   Generalized anxiety disorder   Insulin dependent type 2 diabetes mellitus (HCC)   RSV (respiratory syncytial virus pneumonia)   #1 sepsis secondary to viral RSV pneumonia superimposed with MSSA and stenotrophomonas pneumonia in the setting of interstitial lung disease His oxygen requirement is improving he is now down to 30 L high flow nasal cannula from 35 yesterday.  Sputum culture grew stenotrophomonas and Staph aureus  Currently on Levaquin and minocycline for stenotrophomonas and MSSA day 5 today last dose 1/27 Ancef stopped 1/23 Leukocytosis resolved  #2 interstitial lung disease in the setting of COPD and pulmonary fibrosis PCCM following appreciate input continue steroids nebs Brovana Pulmicort and Yupelri Repeat chest xray 1/25  #3 history of DVT and pulmonary  embolism continue coumadin  #4 paroxysmal atrial fibrillation continue coumadin  #5 type 2 diabetes A1c 8.8 increase Semglee 12 units  and NovoLog 4 units 3 times daily CBG (last 3)  Recent Labs    08/11/23 2205 08/12/23 0809 08/12/23 1201  GLUCAP 262* 231* 291*    #6 hyperlipidemia not on any medications but has been controlled with last LDL of 76 triglycerides 110  #7 BPH continue Flomax  #8 generalized anxiety disorder and insomnia on Effexor and Wellbutrin  #9 peripheral neuropathy on Lyrica and Zanaflex  #10 history of diastolic heart failure has been getting as needed Lasix   #11 CODE STATUS patient DNR  #12 hyponatremia resolved  #13 hypokalemia repleted  #14 insomnia restart Ambien when he was taking at home 10 mg nightly  #15 hypotension patient was being diuresed aggressively diuresis held Cozaar held On low dose levo  Estimated body mass index is 29.3 kg/m as calculated from the following:   Height as of this encounter: 5\' 9"  (1.753 m).   Weight as of this encounter: 90 kg.  DVT prophylaxis: Lovenox Code Status: DN R Family Communication: None at bedside  disposition Plan:  Status is: Inpatient Remains inpatient appropriate because: Acute illness   Consultants:  PCCM  Procedures: None Antimicrobials: Levaquin Ancef and minocycline  Subjective:  No new events  Slept ok Pain better Bp up and down   objective: Vitals:   08/12/23 1113 08/12/23 1115 08/12/23 1130 08/12/23 1145  BP: (!) 153/55 (!) 153/55 (!) 158/69 (!) 150/71  Pulse: (!) 118 (!) 112 (!) 144 (!) 136  Resp: (!) 32 (!) 22 (!) 22 (!) 23  Temp:  TempSrc:      SpO2: 91% 92% (!) 76% 97%  Weight:      Height:        Intake/Output Summary (Last 24 hours) at 08/12/2023 1219 Last data filed at 08/12/2023 1113 Gross per 24 hour  Intake 979.66 ml  Output 6125 ml  Net -5145.34 ml   Filed Weights   08/03/23 1857  Weight: 90 kg    Examination:  General exam: Appears in mild  distress  respiratory system: rhonchi Cardiovascular system: Tachy gastrointestinal system: Abdomen is nondistended, soft and nontender. No organomegaly or masses felt. Normal bowel sounds heard. Central nervous system: Alert and oriented. No focal neurological deficits. Extremities: No edema   Data Reviewed: I have personally reviewed following labs and imaging studies  CBC: Recent Labs  Lab 08/06/23 0242 08/07/23 0231 08/08/23 0306 08/09/23 0316 08/11/23 0319  WBC 11.4* 12.6* 10.1 15.3* 10.1  NEUTROABS 10.5* 11.6* 9.3* 13.7*  --   HGB 11.0* 12.6* 11.9* 12.9* 11.4*  HCT 33.8* 39.2 35.9* 40.5 36.4*  MCV 89.9 87.3 86.9 91.0 91.0  PLT 345 383 332 459* 327   Basic Metabolic Panel: Recent Labs  Lab 08/06/23 0242 08/07/23 0231 08/08/23 0306 08/09/23 0316 08/10/23 0308 08/11/23 0319  NA  --  133* 135 135 136 134*  K  --  3.3* 3.9 3.5 3.3* 3.9  CL  --  95* 98 96* 96* 97*  CO2  --  26 28 27 29 28   GLUCOSE  --  161* 244* 123* 182* 191*  BUN  --  25* 25* 28* 21 24*  CREATININE  --  0.47* 0.50* 0.43* 0.47* 0.45*  CALCIUM  --  8.4* 8.5* 8.3* 8.3* 8.0*  MG 2.1 2.0 2.2 2.0 2.1  --    GFR: Estimated Creatinine Clearance: 83 mL/min (A) (by C-G formula based on SCr of 0.45 mg/dL (L)). Liver Function Tests: No results for input(s): "AST", "ALT", "ALKPHOS", "BILITOT", "PROT", "ALBUMIN" in the last 168 hours.  No results for input(s): "LIPASE", "AMYLASE" in the last 168 hours. No results for input(s): "AMMONIA" in the last 168 hours. Coagulation Profile: Recent Labs  Lab 08/08/23 0306 08/09/23 0316 08/10/23 0308 08/11/23 0319 08/12/23 0320  INR 2.9* 2.4* 1.6* 1.4* 1.3*   Cardiac Enzymes: No results for input(s): "CKTOTAL", "CKMB", "CKMBINDEX", "TROPONINI" in the last 168 hours. BNP (last 3 results) Recent Labs    05/31/23 1534  PROBNP 40.0   HbA1C: No results for input(s): "HGBA1C" in the last 72 hours. CBG: Recent Labs  Lab 08/11/23 1549 08/11/23 2000  08/11/23 2205 08/12/23 0809 08/12/23 1201  GLUCAP 251* 264* 262* 231* 291*   Lipid Profile: No results for input(s): "CHOL", "HDL", "LDLCALC", "TRIG", "CHOLHDL", "LDLDIRECT" in the last 72 hours. Thyroid Function Tests: No results for input(s): "TSH", "T4TOTAL", "FREET4", "T3FREE", "THYROIDAB" in the last 72 hours. Anemia Panel: No results for input(s): "VITAMINB12", "FOLATE", "FERRITIN", "TIBC", "IRON", "RETICCTPCT" in the last 72 hours. Sepsis Labs: No results for input(s): "PROCALCITON", "LATICACIDVEN" in the last 168 hours.   Recent Results (from the past 240 hours)  Culture, blood (routine x 2)     Status: None   Collection Time: 08/03/23  9:00 PM   Specimen: BLOOD RIGHT ARM  Result Value Ref Range Status   Specimen Description   Final    BLOOD RIGHT ARM Performed at [facility], [address], [place], [place] [address]    Special Requests   Final    BOTTLES DRAWN AEROBIC AND ANAEROBIC  Blood Culture results may not be optimal due to an inadequate volume of blood received in culture bottles Performed at [facility], [address] [place], [place] [address]    Culture   Final    NO GROWTH 5 DAYS Performed at [facility], [address], [place], [place] [address]    Report Status 08/08/2023 FINAL  Final  Culture, blood (routine x 2)     Status: None   Collection Time: 08/03/23  9:10 PM   Specimen: BLOOD  Result Value Ref Range Status   Specimen Description   Final    BLOOD RIGHT ANTECUBITAL Performed at [facility], [address], [place], [place] [address]    Special Requests   Final    BOTTLES DRAWN AEROBIC AND ANAEROBIC Blood Culture results may not be optimal due to an inadequate volume of blood received in culture bottles Performed at [facility], [address], [place], [place] [address]    Culture   Final    NO GROWTH 5 DAYS Performed at [facility], [address],  [place], [place] [address]    Report Status 08/08/2023 FINAL  Final  Respiratory (~20 pathogens) panel by PCR     Status: Abnormal   Collection Time: 08/04/23  1:48 AM   Specimen: Nasopharyngeal Swab; Respiratory  Result Value Ref Range Status   Adenovirus NOT DETECTED NOT DETECTED Final   Coronavirus 229E NOT DETECTED NOT DETECTED Final    Comment: (NOTE) The Coronavirus on the Respiratory Panel, DOES NOT test for the novel  Coronavirus (2019 nCoV)    Coronavirus HKU1 NOT DETECTED NOT DETECTED Final   Coronavirus NL63 NOT DETECTED NOT DETECTED Final   Coronavirus OC43 NOT DETECTED NOT DETECTED Final   Metapneumovirus NOT DETECTED NOT DETECTED Final   Rhinovirus / Enterovirus NOT DETECTED NOT DETECTED Final   Influenza A NOT DETECTED NOT DETECTED Final   Influenza B NOT DETECTED NOT DETECTED Final   Parainfluenza Virus 1 NOT DETECTED NOT DETECTED Final   Parainfluenza Virus 2 NOT DETECTED NOT DETECTED Final   Parainfluenza Virus 3 NOT DETECTED NOT DETECTED Final   Parainfluenza Virus 4 NOT DETECTED NOT DETECTED Final   Respiratory Syncytial Virus DETECTED (A) NOT DETECTED Final   Bordetella pertussis NOT DETECTED NOT DETECTED Final   Bordetella Parapertussis NOT DETECTED NOT DETECTED Final   Chlamydophila pneumoniae NOT DETECTED NOT DETECTED Final   Mycoplasma pneumoniae NOT DETECTED NOT DETECTED Final    Comment: Performed at [facility], [address], [place], [place] [address]  Expectorated Sputum Assessment w Gram Stain, Rflx to Resp Cult     Status: None   Collection Time: 08/04/23  8:49 AM   Specimen: Expectorated Sputum  Result Value Ref Range Status   Specimen Description EXPECTORATED SPUTUM  Final   Special Requests Immunocompromised  Final   Sputum evaluation   Final    THIS SPECIMEN IS ACCEPTABLE FOR SPUTUM CULTURE Performed at [facility], [address] [place], [place] [address]    Report Status 08/05/2023 FINAL  Final  MRSA Next Gen by  PCR, Nasal     Status: None   Collection Time: 08/04/23  8:49 AM   Specimen: Nasal Mucosa; Nasal Swab  Result Value Ref Range Status   MRSA by PCR Next Gen NOT DETECTED NOT DETECTED Final    Comment: (NOTE) The GeneXpert MRSA Assay (FDA approved for NASAL specimens only), is one component  of a comprehensive MRSA colonization surveillance program. It is not intended to diagnose MRSA infection nor to guide or monitor treatment for MRSA infections. Test performance is not FDA approved in patients less than 68 years old. Performed at [facility], [address], [place], [place] [address]   Culture, Respiratory w Gram Stain     Status: None   Collection Time: 08/04/23  8:49 AM  Result Value Ref Range Status   Specimen Description   Final    EXPECTORATED SPUTUM Performed at [facility], [address] [place], [place] [address]    Special Requests   Final    Immunocompromised Reflexed from [phone] Performed at [facility], [address], [place], [place] [address]    Gram Stain   Final    NO WBC SEEN FEW GRAM POSITIVE COCCI Performed at [facility], [address], [place], [place] [address]    Culture   Final    ABUNDANT STAPHYLOCOCCUS AUREUS ABUNDANT STENOTROPHOMONAS MALTOPHILIA    Report Status 08/07/2023 FINAL  Final   Organism ID, Bacteria STAPHYLOCOCCUS AUREUS  Final   Organism ID, Bacteria STENOTROPHOMONAS MALTOPHILIA  Final      Susceptibility   Staphylococcus aureus - MIC*    CIPROFLOXACIN <=0.5 SENSITIVE Sensitive     ERYTHROMYCIN <=0.25 SENSITIVE Sensitive     GENTAMICIN <=0.5 SENSITIVE Sensitive     OXACILLIN 0.5 SENSITIVE Sensitive     TETRACYCLINE <=1 SENSITIVE Sensitive     VANCOMYCIN 1 SENSITIVE Sensitive     TRIMETH/SULFA <=10 SENSITIVE Sensitive     CLINDAMYCIN <=0.25 SENSITIVE Sensitive     RIFAMPIN <=0.5 SENSITIVE Sensitive     Inducible Clindamycin NEGATIVE Sensitive     LINEZOLID 2  SENSITIVE Sensitive     * ABUNDANT STAPHYLOCOCCUS AUREUS   Stenotrophomonas maltophilia - MIC*    LEVOFLOXACIN 2 SENSITIVE Sensitive     TRIMETH/SULFA 80 RESISTANT Resistant     * ABUNDANT STENOTROPHOMONAS MALTOPHILIA         Radiology Studies: No results found.   Scheduled Meds:  arformoterol  15 mcg Nebulization BID   budesonide (PULMICORT) nebulizer solution  0.5 mg Nebulization BID   busPIRone  15 mg Oral TID   Chlorhexidine Gluconate Cloth  6 each Topical Daily   dextromethorphan-guaiFENesin  2 tablet Oral BID   enoxaparin (LOVENOX) injection  90 mg Subcutaneous Q12H   insulin aspart  0-20 Units Subcutaneous TID WC   insulin aspart  0-5 Units Subcutaneous QHS   insulin aspart  4 Units Subcutaneous TID WC   insulin glargine-yfgn  10 Units Subcutaneous Daily   minocycline  200 mg Oral BID   oxyCODONE-acetaminophen  1 tablet Oral Q4H   predniSONE  40 mg Oral Q breakfast   pregabalin  75 mg Oral BID   revefenacin  175 mcg Nebulization Daily   tamsulosin  0.8 mg Oral QPM   tiZANidine  6 mg Oral Q4H   venlafaxine XR  150 mg Oral Q breakfast   warfarin  5 mg Oral ONCE-1600   Warfarin - Pharmacist Dosing Inpatient   Does not apply q1600   zolpidem  5 mg Oral QHS   Continuous Infusions:  levofloxacin (LEVAQUIN) IV Stopped (08/11/23 1547)   norepinephrine (LEVOPHED) Adult infusion Stopped (08/12/23 0920)     LOS: 9 days    Time spent: 39 min Alwyn Ren, MD 08/12/2023, 12:19 PM

## 2023-08-12 NOTE — Progress Notes (Signed)
PHARMACY - ANTICOAGULATION CONSULT NOTE  Pharmacy Consult for Warfarin Indication: atrial fibrillation and hx DVT  Allergies  Allergen Reactions   Daptomycin     Drug induced pneumonia    Diltiazem Other (See Comments)    Passed out   Ofev [Nintedanib] Diarrhea    fatigue   Pirfenidone Diarrhea    fatigue    Patient Measurements: Height: 5\' 9"  (175.3 cm) Weight: 90 kg (198 lb 6.6 oz) IBW/kg (Calculated) : 70.7  Vital Signs: Temp: 98.5 F (36.9 C) (01/25 0843) Temp Source: Oral (01/25 0843) BP: 145/57 (01/25 0915) Pulse Rate: 80 (01/25 0915)  Labs: Recent Labs    08/10/23 0308 08/11/23 0319 08/12/23 0320  HGB  --  11.4*  --   HCT  --  36.4*  --   PLT  --  327  --   LABPROT 19.6* 17.1* 16.7*  INR 1.6* 1.4* 1.3*  CREATININE 0.47* 0.45*  --     Estimated Creatinine Clearance: 83 mL/min (A) (by C-G formula based on SCr of 0.45 mg/dL (L)).   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   Esophageal reflux 02/10/2015   Fatigue    HTN (hypertension)    Hyperglycemia    preDM    Hyperlipidemia    Insomnia    Low back pain radiating to both legs 01/16/2017   Measles as a child   Overweight(278.02)    Peripheral neuropathy 08/07/2019   Personal history of colonic polyps 10/27/2012   Follows with [facility]   Pneumonia    " walking"   Preventative health care    Testosterone deficiency 05/23/2012   Wears glasses     Medications:  Scheduled:   arformoterol  15 mcg Nebulization BID   budesonide  (PULMICORT) nebulizer solution  0.5 mg Nebulization BID   busPIRone  15 mg Oral TID   Chlorhexidine Gluconate Cloth  6 each Topical Daily   dextromethorphan-guaiFENesin  2 tablet Oral BID   enoxaparin (LOVENOX) injection  90 mg Subcutaneous Q12H   insulin aspart  0-20 Units Subcutaneous TID WC   insulin aspart  0-5 Units Subcutaneous QHS   insulin aspart  4 Units Subcutaneous TID WC   insulin glargine-yfgn  10 Units Subcutaneous Daily   minocycline  200 mg Oral BID   oxyCODONE-acetaminophen  1 tablet Oral Q4H   predniSONE  40 mg Oral Q breakfast   pregabalin  75 mg Oral BID   revefenacin  175 mcg Nebulization Daily   tamsulosin  0.8 mg Oral QPM   tiZANidine  6 mg Oral Q4H   venlafaxine XR  150 mg Oral Q breakfast   Warfarin - Pharmacist Dosing Inpatient   Does not apply q1600   zolpidem  5 mg Oral QHS   Infusions:   levofloxacin (LEVAQUIN) IV Stopped (08/11/23 1547)   norepinephrine (LEVOPHED) Adult infusion Stopped (08/12/23 0920)   PTA warfarin: 5mg  daily except 7.5 mg on Tuesdays and Saturdays.   Assessment: 64 yoM admitted  on 1/16 with acute on chronic hypoxic resp failure. Home warfarin was initially continued but stopped after two doses d/t NPO status >> pt was transitioned to Enoxaparin instead. Pharmacy is now consulted to resume warfarin dosing.   Today, 08/12/23: INR remains subtherapeutic at 1.3 despite possible interaction with Levaquin and minocycline (both can increase INR). Will plan to increase warfarin dose today. 1/24 CBC: Hgb 11.4, plts 327--stable. No s/sx of bleeding reported  Goal of Therapy:  INR 2-3 Monitor platelets by anticoagulation protocol: Yes   Plan:  Continue Lovenox 90mg  SQ q12hrs until INR is therapeutic  Warfarin 5 mg PO x 1 Daily PT/INR Monitor for signs and symptoms of bleeding   Cherylin Mylar, PharmD Clinical Pharmacist  1/25/20259:29 AM

## 2023-08-13 DIAGNOSIS — J189 Pneumonia, unspecified organism: Secondary | ICD-10-CM | POA: Diagnosis not present

## 2023-08-13 DIAGNOSIS — J9621 Acute and chronic respiratory failure with hypoxia: Secondary | ICD-10-CM | POA: Diagnosis not present

## 2023-08-13 DIAGNOSIS — R579 Shock, unspecified: Secondary | ICD-10-CM | POA: Diagnosis not present

## 2023-08-13 LAB — GLUCOSE, CAPILLARY
Glucose-Capillary: 207 mg/dL — ABNORMAL HIGH (ref 70–99)
Glucose-Capillary: 250 mg/dL — ABNORMAL HIGH (ref 70–99)
Glucose-Capillary: 246 mg/dL — ABNORMAL HIGH (ref 70–99)
Glucose-Capillary: 159 mg/dL — ABNORMAL HIGH (ref 70–99)

## 2023-08-13 LAB — PROTIME-INR
INR: 1.4 — ABNORMAL HIGH (ref 0.8–1.2)
Prothrombin Time: 17.2 s — ABNORMAL HIGH (ref 11.4–15.2)

## 2023-08-13 MED ORDER — WARFARIN SODIUM 5 MG PO TABS
5.0000 mg | ORAL_TABLET | Freq: Once | ORAL | Status: AC
  Administered 2023-08-13: 5 mg via ORAL
  Filled 2023-08-13: qty 1

## 2023-08-13 NOTE — Progress Notes (Signed)
NAME:  Luis Brown, MRN:  [id number], DOB:  [birth year], LOS: 10 ADMISSION DATE:  08/03/2023, CONSULTATION DATE:  08/07/23 REFERRING MD:  TRH, CHIEF COMPLAINT:  acute on chronic hypoxic resp failure   History of Present Illness:  78 yo male presented a day ago with worsening sob. Pt was found to be worsning hypoxia not tolerant of NRB or NIV so was placed on hhfnc which has been sufficient thus far. However pt's mentation continues to decline, he is oriented only to self at this time. Family is en route. I have attempted to discuss with him code status but his mentation is prohibitive of this.   Pt is currently agitated pulling at lines, pulling at oxygen tubing despite mittens bilaterally. Saying " I just want to get home"  UPDATE:  D/w wife pt would never want cpr or intubation. Pt made DNR/DNI   Pertinent  Medical History  Eosinophilic penumonitis,  ILD Pulm fibrosis Copd -acute exacerbation,  Crhonic hypoxic resp failure Depression Anxiety Hyperlipidemia H/o pe with dvt  Significant Hospital Events: Including procedures, antibiotic start and stop dates in addition to other pertinent events   1/16 Admitted to hospital   1/20 with rapid decline req ccm consultation for resp distress. Added treatment for stenotrophamonas. Made DNR 1/21 better post adding rx for steno  1/22 HHFNC wt 55L/min this am 1/23 Unable to sleep last night, remains on 60L HHFNC   Interim History / Subjective:  Not on vasopressors. Feels a little better today. Sat up in chair for 2 hours yesterday. Would like to work with PT. Wife at bedside.   Objective   Blood pressure (!) 139/57, pulse 84, temperature 98 F (36.7 C), temperature source Axillary, resp. rate (!) 22, height 5\' 9"  (1.753 m), weight 90 kg, SpO2 94%.    FiO2 (%):  [30 %-31 %] 31 %   Intake/Output Summary (Last 24 hours) at 08/13/2023 1114 Last data filed at 08/13/2023 4098 Gross per 24 hour  Intake 1385.88 ml  Output 2650 ml  Net  -1264.12 ml   Filed Weights   08/03/23 1857  Weight: 90 kg    Examination: Elderly man sitting up in bed On HHFNC Breathing slightly labored, improved rhonchi, stable bibasilar crackles RRR  No peripheral edema  Resolved Hospital Problem list   Volume overload Staph pna Acute metabolic encephalopathy  Assessment & Plan:  Acute on chronic hypoxic resp failure ILD/IPF COPD HFpEF RSV pna Stenotrophamonas pna Atrial Fib on AC  Hyperglycemia  Anemia  Pulm/ccm prob list Acute on chronic hypoxic resp failure secondary to RSV viral PNA complicated further by secondary staph and stenotrophamonas PNA superimposed on Pulm fibrosis/ILD and COPD -started on Levaquin 1/20 w/ slow clinical improvement. Since that time systemic steroids, abx, BDs and diuresis have been therapeutic interventions. Completed ancef. Wbc improving.  Plan Levaquin and minocycline for stenotrophamonas; planning on 7 d course  Prednisone 40 mg daily Cont to wean O2 sat goal > 88% - I have asked RT to transition him to salter today Cont Brovana, Pulmicort, and Yupelri Maintain euvolemia Encourage pulmonary hygiene  Mobilize as able Going to need some more time.   Hypotension/circulatory shock Plan Holding home anti-hypertensives Holding diuresis given net negative fluid balance since admission Intermittently on vasopressors.  Hyperglycemia Plan SSI, chang to tid &HS w/ resistant scale.  meal time coverage 4units Semglee 10 units   Best Practice (right click and "Reselect all SmartList Selections" daily)   Diet/type: Regular consistency (see orders) DVT prophylaxis LMWH  Pressure ulcer(s): N/A GI prophylaxis: H2B Lines: N/A Foley:  N/A Code Status:  DNR Last date of multidisciplinary goals of care discussion [DNR/DNI previously established. Wife and husband updated at bedside 1/25]  I spent 35 minutes in total visit time for this patient, with more than 50% spent counseling/coordinating  care.  Durel Salts, MD Pulmonary and Critical Care Medicine [facility] 08/13/2023 11:16 AM Pager: see AMION  If no response to pager, please call critical care on call (see AMION) until 7pm After 7:00 pm call Elink

## 2023-08-13 NOTE — Progress Notes (Signed)
PHARMACY - ANTICOAGULATION CONSULT NOTE  Pharmacy Consult for Warfarin Indication: atrial fibrillation and hx DVT  Allergies  Allergen Reactions   Daptomycin     Drug induced pneumonia    Diltiazem Other (See Comments)    Passed out   Ofev [Nintedanib] Diarrhea    fatigue   Pirfenidone Diarrhea    fatigue    Patient Measurements: Height: 5\' 9"  (175.3 cm) Weight: 90 kg (198 lb 6.6 oz) IBW/kg (Calculated) : 70.7  Vital Signs: Temp: 98 F (36.7 C) (01/26 0300) Temp Source: Axillary (01/26 0300) BP: 108/46 (01/26 0900) Pulse Rate: 88 (01/26 0900)  Labs: Recent Labs    08/11/23 0319 08/12/23 0320 08/13/23 0245  HGB 11.4*  --   --   HCT 36.4*  --   --   PLT 327  --   --   LABPROT 17.1* 16.7* 17.2*  INR 1.4* 1.3* 1.4*  CREATININE 0.45*  --   --     Estimated Creatinine Clearance: 83 mL/min (A) (by C-G formula based on SCr of 0.45 mg/dL (L)).   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   Esophageal reflux 02/10/2015   Fatigue    HTN (hypertension)    Hyperglycemia    preDM    Hyperlipidemia    Insomnia    Low back pain radiating to both legs 01/16/2017   Measles as a child   Overweight(278.02)    Peripheral neuropathy 08/07/2019   Personal history of colonic polyps 10/27/2012   Follows with [facility]   Pneumonia    " walking"   Preventative health care    Testosterone deficiency 05/23/2012   Wears glasses     Medications:  Scheduled:   arformoterol  15 mcg Nebulization BID   budesonide  (PULMICORT) nebulizer solution  0.5 mg Nebulization BID   busPIRone  15 mg Oral TID   Chlorhexidine Gluconate Cloth  6 each Topical Daily   dextromethorphan-guaiFENesin  2 tablet Oral BID   enoxaparin (LOVENOX) injection  90 mg Subcutaneous Q12H   insulin aspart  0-20 Units Subcutaneous TID WC   insulin aspart  0-5 Units Subcutaneous QHS   insulin aspart  4 Units Subcutaneous TID WC   insulin glargine-yfgn  10 Units Subcutaneous Daily   minocycline  200 mg Oral BID   oxyCODONE-acetaminophen  1 tablet Oral Q4H   predniSONE  40 mg Oral Q breakfast   pregabalin  75 mg Oral BID   revefenacin  175 mcg Nebulization Daily   tamsulosin  0.8 mg Oral QPM   tiZANidine  6 mg Oral Q4H   venlafaxine XR  150 mg Oral Q breakfast   Warfarin - Pharmacist Dosing Inpatient   Does not apply q1600   zolpidem  5 mg Oral QHS   Infusions:   levofloxacin (LEVAQUIN) IV Stopped (08/12/23 1645)   norepinephrine (LEVOPHED) Adult infusion Stopped (08/12/23 0920)   PTA warfarin: 5mg  daily except 7.5 mg on Tuesdays and Saturdays.   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 Home warfarin was initially continued but stopped after two doses d/t NPO status >> pt was transitioned to Enoxaparin instead. Pharmacy is now consulted to resume warfarin dosing.   Today, 08/13/23: INR remains subtherapeutic at 1.4 despite dose increase 1/25 and possible interaction with Levaquin and minocycline (both can increase INR). Impact of warfarin on INR typically seen ~48hrs after it is administered. Will proceed with the same dose today in anticipation of INR increase tomorrow.  Note: levofloxacin therapy ends today, 1/26, and minocycline therapy ends 1/27 AM. 1/24 CBC: Hgb 11.4, plts 327--stable. Will f/u CBC again tomorrow. No s/sx of bleeding reported  Goal of Therapy:  INR 2-3 Monitor platelets by anticoagulation protocol: Yes   Plan:  Continue Lovenox 90mg  SQ q12hrs until INR is  therapeutic  Warfarin 5 mg PO x 1 Daily PT/INR Monitor for signs and symptoms of bleeding   Cherylin Mylar, PharmD Clinical Pharmacist  1/26/20259:07 AM

## 2023-08-13 NOTE — Progress Notes (Signed)
PROGRESS NOTE    Luis Brown  WUJ:[id number] DOB: 08/16/1945 DOA: 08/03/2023 PCP: Bradd Canary, MD    Brief Narrative:  78 yo male with PMH eosinophilic pneumonitis, ILD, pulmonary fibrosis, COPD, chronic hypoxia on 2 L O2 at home, PAF, history of PE and DVT on Coumadin, GERD, malnutrition, HLD, anxiety/depression, PMR who presented with cough, congestion, and worsening shortness of breath. He was tachycardic, tachypneic, and had leukocytosis on admission. CXR showed diffuse interstitial opacities.  Respiratory swabs were positive for RSV. He was started on steroids, nebs, and antibiotics.  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   Paroxysmal atrial fibrillation (HCC)   Hyponatremia   Insomnia   BPH (benign prostatic hyperplasia)   GERD (gastroesophageal reflux disease)   History of DVT (deep vein thrombosis)   Hyperlipidemia   Pulmonary fibrosis (HCC)   History of pulmonary embolism   Hypochloremia   Generalized anxiety disorder   Insulin dependent type 2 diabetes mellitus (HCC)   RSV (respiratory syncytial virus pneumonia)   #1 sepsis secondary to viral RSV pneumonia superimposed with MSSA and stenotrophomonas pneumonia in the setting of interstitial lung disease His oxygen requirement is improving he is now down to 30 L high flow nasal cannula from 35 yesterday.  Sputum culture grew stenotrophomonas and Staph aureus  Currently on Levaquin and minocycline for stenotrophomonas and MSSA day 5 today last dose 1/27 Ancef stopped 1/23 Leukocytosis resolved PT consult  #2 interstitial lung disease in the setting of COPD and pulmonary fibrosis PCCM following appreciate input continue steroids nebs Brovana Pulmicort and Yupelri Repeat chest xray 1/25  #3 history of DVT and  pulmonary embolism continue coumadin  #4 paroxysmal atrial fibrillation continue coumadin  #5 type 2 diabetes A1c 8.8 increase Semglee 12 units  and NovoLog 4 units 3 times daily CBG (last 3)  Recent Labs    08/12/23 1648 08/12/23 2125 08/13/23 0825  GLUCAP 303* 126* 207*    #6 hyperlipidemia not on any medications but has been controlled with last LDL of 76 triglycerides 110  #7 BPH continue Flomax  #8 generalized anxiety disorder and insomnia on Effexor and Wellbutrin  #9 peripheral neuropathy on Lyrica and Zanaflex  #10 history of diastolic heart failure has been getting as needed Lasix  Pressure Injury 08/12/23 Buttocks Right;Posterior;Medial Stage 2 -  Partial thickness loss of dermis presenting as a shallow open injury with a red, pink wound bed without slough. 2.54 centimeters pink/red (Active)  08/12/23 2200  Location: Buttocks  Location Orientation: Right;Posterior;Medial  Staging: Stage 2 -  Partial thickness loss of dermis presenting as a shallow open injury with a red, pink wound bed without slough.  Wound Description (Comments): 2.54 centimeters pink/red  Present on Admission: No  Dressing Type Foam - Lift dressing to assess site every shift 08/13/23 0738  #11 CODE STATUS patient DNR  #12 hyponatremia resolved  #13 hypokalemia repleted  #14 insomnia restart Ambien when he was taking at home 10 mg nightly  #15 hypotension patient was being diuresed aggressively diuresis held Cozaar held Not on pressors dcd 1/25   Estimated body mass index is 29.3 kg/m as calculated from the following:   Height as of this encounter: 5\' 9"  (1.753 m).   Weight as of this encounter: 90 kg.  DVT prophylaxis: Lovenox Code Status: DN  R Family Communication: None at bedside  disposition Plan:  Status is: Inpatient Remains inpatient appropriate because: Acute illness   Consultants:  PCCM  Procedures: None Antimicrobials: Levaquin Ancef and  minocycline  Subjective:  Blood pressure improved not on pressors he is in bed eating breakfast seems to have a good appetite Chest x-ray from yesterday shows improvement  objective: Vitals:   08/13/23 0918 08/13/23 1000 08/13/23 1100 08/13/23 1208  BP:  (!) 139/57 (!) 167/61 (!) 169/64  Pulse: 93 84 94 (!) 101  Resp: (!) 42 (!) 22 (!) 26 19  Temp:      TempSrc:      SpO2: 92% 94% 93% 99%  Weight:      Height:        Intake/Output Summary (Last 24 hours) at 08/13/2023 1213 Last data filed at 08/13/2023 1208 Gross per 24 hour  Intake 1385.88 ml  Output 3500 ml  Net -2114.12 ml   Filed Weights   08/03/23 1857  Weight: 90 kg    Examination:  General exam: Appears in mild distress  respiratory system: rhonchi Cardiovascular system: Tachy gastrointestinal system: Abdomen is nondistended, soft and nontender. No organomegaly or masses felt. Normal bowel sounds heard. Central nervous system: Alert and oriented. No focal neurological deficits. Extremities: No edema   Data Reviewed: I have personally reviewed following labs and imaging studies  CBC: Recent Labs  Lab 08/07/23 0231 08/08/23 0306 08/09/23 0316 08/11/23 0319  WBC 12.6* 10.1 15.3* 10.1  NEUTROABS 11.6* 9.3* 13.7*  --   HGB 12.6* 11.9* 12.9* 11.4*  HCT 39.2 35.9* 40.5 36.4*  MCV 87.3 86.9 91.0 91.0  PLT 383 332 459* 327   Basic Metabolic Panel: Recent Labs  Lab 08/07/23 0231 08/08/23 0306 08/09/23 0316 08/10/23 0308 08/11/23 0319  NA 133* 135 135 136 134*  K 3.3* 3.9 3.5 3.3* 3.9  CL 95* 98 96* 96* 97*  CO2 26 28 27 29 28   GLUCOSE 161* 244* 123* 182* 191*  BUN 25* 25* 28* 21 24*  CREATININE 0.47* 0.50* 0.43* 0.47* 0.45*  CALCIUM 8.4* 8.5* 8.3* 8.3* 8.0*  MG 2.0 2.2 2.0 2.1  --    GFR: Estimated Creatinine Clearance: 83 mL/min (A) (by C-G formula based on SCr of 0.45 mg/dL (L)). Liver Function Tests: No results for input(s): "AST", "ALT", "ALKPHOS", "BILITOT", "PROT", "ALBUMIN" in the last  168 hours.  No results for input(s): "LIPASE", "AMYLASE" in the last 168 hours. No results for input(s): "AMMONIA" in the last 168 hours. Coagulation Profile: Recent Labs  Lab 08/09/23 0316 08/10/23 0308 08/11/23 0319 08/12/23 0320 08/13/23 0245  INR 2.4* 1.6* 1.4* 1.3* 1.4*   Cardiac Enzymes: No results for input(s): "CKTOTAL", "CKMB", "CKMBINDEX", "TROPONINI" in the last 168 hours. BNP (last 3 results) Recent Labs    05/31/23 1534  PROBNP 40.0   HbA1C: No results for input(s): "HGBA1C" in the last 72 hours. CBG: Recent Labs  Lab 08/12/23 0809 08/12/23 1201 08/12/23 1648 08/12/23 2125 08/13/23 0825  GLUCAP 231* 291* 303* 126* 207*   Lipid Profile: No results for input(s): "CHOL", "HDL", "LDLCALC", "TRIG", "CHOLHDL", "LDLDIRECT" in the last 72 hours. Thyroid Function Tests: No results for input(s): "TSH", "T4TOTAL", "FREET4", "T3FREE", "THYROIDAB" in the last 72 hours. Anemia Panel: No results for input(s): "VITAMINB12", "FOLATE", "FERRITIN", "TIBC", "IRON", "RETICCTPCT" in the last 72 hours. Sepsis Labs: No results for input(s): "PROCALCITON", "LATICACIDVEN" in the last 168 hours.   Recent Results (from the past 240 hours)  Culture, blood (routine x 2)  Status: None   Collection Time: 08/03/23  9:00 PM   Specimen: BLOOD RIGHT ARM  Result Value Ref Range Status   Specimen Description   Final    BLOOD RIGHT ARM Performed at [facility], [address], [place], [place] [address]    Special Requests   Final    BOTTLES DRAWN AEROBIC AND ANAEROBIC Blood Culture results may not be optimal due to an inadequate volume of blood received in culture bottles Performed at [facility], [address], [place], [place] [address]    Culture   Final    NO GROWTH 5 DAYS Performed at [facility], [address] [place], [place] [address]    Report Status 08/08/2023 FINAL  Final  Culture, blood (routine x 2)     Status: None    Collection Time: 08/03/23  9:10 PM   Specimen: BLOOD  Result Value Ref Range Status   Specimen Description   Final    BLOOD RIGHT ANTECUBITAL Performed at [facility], [address], [place], [place] [address]    Special Requests   Final    BOTTLES DRAWN AEROBIC AND ANAEROBIC Blood Culture results may not be optimal due to an inadequate volume of blood received in culture bottles Performed at [facility], [address] [place], [place] [address]    Culture   Final    NO GROWTH 5 DAYS Performed at [facility], [address], [place], [place] [address]    Report Status 08/08/2023 FINAL  Final  Respiratory (~20 pathogens) panel by PCR     Status: Abnormal   Collection Time: 08/04/23  1:48 AM   Specimen: Nasopharyngeal Swab; Respiratory  Result Value Ref Range Status   Adenovirus NOT DETECTED NOT DETECTED Final   Coronavirus 229E NOT DETECTED NOT DETECTED Final    Comment: (NOTE) The Coronavirus on the Respiratory Panel, DOES NOT test for the novel  Coronavirus (2019 nCoV)    Coronavirus HKU1 NOT DETECTED NOT DETECTED Final   Coronavirus NL63 NOT DETECTED NOT DETECTED Final   Coronavirus OC43 NOT DETECTED NOT DETECTED Final   Metapneumovirus NOT DETECTED NOT DETECTED Final   Rhinovirus / Enterovirus NOT DETECTED NOT DETECTED Final   Influenza A NOT DETECTED NOT DETECTED Final   Influenza B NOT DETECTED NOT DETECTED Final   Parainfluenza Virus 1 NOT DETECTED NOT DETECTED Final   Parainfluenza Virus 2 NOT DETECTED NOT DETECTED Final   Parainfluenza Virus 3 NOT DETECTED NOT DETECTED Final   Parainfluenza Virus 4 NOT DETECTED NOT DETECTED Final   Respiratory Syncytial Virus DETECTED (A) NOT DETECTED Final   Bordetella pertussis NOT DETECTED NOT DETECTED Final   Bordetella Parapertussis NOT DETECTED NOT DETECTED Final   Chlamydophila pneumoniae NOT DETECTED NOT DETECTED Final   Mycoplasma pneumoniae NOT DETECTED NOT DETECTED Final     Comment: Performed at [facility], [address], [place], [place] [address]  Expectorated Sputum Assessment w Gram Stain, Rflx to Resp Cult     Status: None   Collection Time: 08/04/23  8:49 AM   Specimen: Expectorated Sputum  Result Value Ref Range Status   Specimen Description EXPECTORATED SPUTUM  Final   Special Requests Immunocompromised  Final   Sputum evaluation   Final    THIS SPECIMEN IS ACCEPTABLE FOR SPUTUM CULTURE Performed at [facility], [address], [place], [place] [address]    Report Status 08/05/2023 FINAL  Final  MRSA Next Gen by PCR, Nasal     Status: None   Collection Time: 08/04/23  8:49 AM   Specimen: Nasal Mucosa; Nasal Swab  Result Value Ref Range Status   MRSA by PCR Next Gen NOT DETECTED NOT DETECTED Final    Comment: (NOTE) The GeneXpert MRSA Assay (FDA approved for NASAL specimens only), is one component of a comprehensive MRSA colonization surveillance program. It is not intended to diagnose MRSA infection nor to guide or monitor treatment for MRSA infections. Test performance is not FDA approved in patients less than 1 years old. Performed at [facility], [address] [place], [place] [address]   Culture, Respiratory w Gram Stain     Status: None   Collection Time: 08/04/23  8:49 AM  Result Value Ref Range Status   Specimen Description   Final    EXPECTORATED SPUTUM Performed at [facility], [address], [place], [place] [address]    Special Requests   Final    Immunocompromised Reflexed from [phone] Performed at [facility], [address] [place], [place] [address]    Gram Stain   Final    NO WBC SEEN FEW GRAM POSITIVE COCCI Performed at [facility], [address], [place], [place] [address]    Culture   Final    ABUNDANT STAPHYLOCOCCUS AUREUS ABUNDANT STENOTROPHOMONAS MALTOPHILIA    Report Status 08/07/2023 FINAL  Final   Organism  ID, Bacteria STAPHYLOCOCCUS AUREUS  Final   Organism ID, Bacteria STENOTROPHOMONAS MALTOPHILIA  Final      Susceptibility   Staphylococcus aureus - MIC*    CIPROFLOXACIN <=0.5 SENSITIVE Sensitive     ERYTHROMYCIN <=0.25 SENSITIVE Sensitive     GENTAMICIN <=0.5 SENSITIVE Sensitive     OXACILLIN 0.5 SENSITIVE Sensitive     TETRACYCLINE <=1 SENSITIVE Sensitive     VANCOMYCIN 1 SENSITIVE Sensitive     TRIMETH/SULFA <=10 SENSITIVE Sensitive     CLINDAMYCIN <=0.25 SENSITIVE Sensitive     RIFAMPIN <=0.5 SENSITIVE Sensitive     Inducible Clindamycin NEGATIVE Sensitive     LINEZOLID 2 SENSITIVE Sensitive     * ABUNDANT STAPHYLOCOCCUS AUREUS   Stenotrophomonas maltophilia - MIC*    LEVOFLOXACIN 2 SENSITIVE Sensitive     TRIMETH/SULFA 80 RESISTANT Resistant     * ABUNDANT STENOTROPHOMONAS MALTOPHILIA         Radiology Studies: DG Chest 1 View Result Date: 08/12/2023 CLINICAL DATA:  Hypoxia. Cough and congestion. Worsening shortness of breath. EXAM: CHEST  1 VIEW COMPARISON:  08/07/2023 FINDINGS: Lung volumes are low. Improving interstitial opacities, greatest residual in the right greater than left upper lobes and left lung base. No new airspace disease. Stable heart size and mediastinal contours. No pneumothorax or significant pleural effusion. Thoracolumbar fusion hardware. IMPRESSION: Improving interstitial opacities, greatest residual in the upper lung zones and left lung base. No new abnormalities. Electronically Signed   By: Narda Rutherford M.D.   On: 08/12/2023 16:50     Scheduled Meds:  arformoterol  15 mcg Nebulization BID   budesonide (PULMICORT) nebulizer solution  0.5 mg Nebulization BID   busPIRone  15 mg Oral TID   Chlorhexidine Gluconate Cloth  6 each Topical Daily   dextromethorphan-guaiFENesin  2 tablet Oral BID   enoxaparin (LOVENOX) injection  90 mg Subcutaneous Q12H   insulin aspart  0-20 Units Subcutaneous TID WC   insulin aspart  0-5 Units Subcutaneous QHS    insulin aspart  4 Units  Subcutaneous TID WC   insulin glargine-yfgn  10 Units Subcutaneous Daily   minocycline  200 mg Oral BID   oxyCODONE-acetaminophen  1 tablet Oral Q4H   predniSONE  40 mg Oral Q breakfast   pregabalin  75 mg Oral BID   revefenacin  175 mcg Nebulization Daily   tamsulosin  0.8 mg Oral QPM   tiZANidine  6 mg Oral Q4H   venlafaxine XR  150 mg Oral Q breakfast   warfarin  5 mg Oral ONCE-1600   Warfarin - Pharmacist Dosing Inpatient   Does not apply q1600   zolpidem  5 mg Oral QHS   Continuous Infusions:  levofloxacin (LEVAQUIN) IV Stopped (08/12/23 1645)   norepinephrine (LEVOPHED) Adult infusion Stopped (08/12/23 0920)     LOS: 10 days    Time spent: 39 min Alwyn Ren, MD 08/13/2023, 12:13 PM

## 2023-08-13 NOTE — Plan of Care (Signed)
  Problem: Education: Goal: Ability to describe self-care measures that may prevent or decrease complications (Diabetes Survival Skills Education) will improve Outcome: Progressing Goal: Individualized Educational Video(s) Outcome: Progressing   Problem: Coping: Goal: Ability to adjust to condition or change in health will improve Outcome: Progressing   Problem: Fluid Volume: Goal: Ability to maintain a balanced intake and output will improve Outcome: Progressing   Problem: Health Behavior/Discharge Planning: Goal: Ability to identify and utilize available resources and services will improve Outcome: Progressing Goal: Ability to manage health-related needs will improve Outcome: Progressing   Problem: Metabolic: Goal: Ability to maintain appropriate glucose levels will improve Outcome: Progressing   Problem: Nutritional: Goal: Maintenance of adequate nutrition will improve Outcome: Progressing Goal: Progress toward achieving an optimal weight will improve Outcome: Progressing   Problem: Skin Integrity: Goal: Risk for impaired skin integrity will decrease Outcome: Progressing   Problem: Tissue Perfusion: Goal: Adequacy of tissue perfusion will improve Outcome: Progressing   Problem: Education: Goal: Knowledge of General Education information will improve Description: Including pain rating scale, medication(s)/side effects and non-pharmacologic comfort measures Outcome: Progressing   Problem: Health Behavior/Discharge Planning: Goal: Ability to manage health-related needs will improve Outcome: Progressing   Problem: Clinical Measurements: Goal: Will remain free from infection Outcome: Progressing Goal: Diagnostic test results will improve Outcome: Progressing Goal: Respiratory complications will improve Outcome: Progressing Goal: Cardiovascular complication will be avoided Outcome: Progressing   Problem: Activity: Goal: Risk for activity intolerance will  decrease Outcome: Progressing   Problem: Nutrition: Goal: Adequate nutrition will be maintained Outcome: Progressing   Problem: Coping: Goal: Level of anxiety will decrease Outcome: Progressing   Problem: Elimination: Goal: Will not experience complications related to bowel motility Outcome: Progressing Goal: Will not experience complications related to urinary retention Outcome: Progressing   Problem: Safety: Goal: Ability to remain free from injury will improve Outcome: Progressing   Problem: Skin Integrity: Goal: Risk for impaired skin integrity will decrease Outcome: Progressing   Problem: Education: Goal: Knowledge of disease or condition will improve Outcome: Progressing Goal: Knowledge of the prescribed therapeutic regimen will improve Outcome: Progressing Goal: Individualized Educational Video(s) Outcome: Progressing   Problem: Activity: Goal: Ability to tolerate increased activity will improve Outcome: Progressing Goal: Will verbalize the importance of balancing activity with adequate rest periods Outcome: Progressing   Problem: Respiratory: Goal: Ability to maintain a clear airway will improve Outcome: Progressing Goal: Levels of oxygenation will improve Outcome: Progressing Goal: Ability to maintain adequate ventilation will improve Outcome: Progressing

## 2023-08-13 NOTE — Plan of Care (Signed)
Patient on 20 liters of heated hight flow, continues to be short of breath while in bed and respiration remain elevated, patient on several breathing treatments, however some are PRN, he is on Brovana Bid, Proventil every 4 hours PRN, Pulmicort 3 times daily and yupelri 175 mcg daily, due to respiratory syncytial virus this contributes to his shortness of breath, along with a history of respiratory issues, such as eosinophilic pneumonitis, ILD, pulm fibrosis, COPD, chronic hypoxia where he wears 2 liters at home, his pain is also not progressing, he is on schedule percocet every four hours along with Zanaflex and he takes lyrica 75 mg twice a day for complaints of lower back pain, states pain is always a 7 out of ten, education provided to patient on pain relief techniques such as repositioning in bed,pillows, heat packs and stretches while in bed. Plan of of care and goals discussed with patient with time given for questions, patient handbook/guide at bedside.  Problem: Education: Goal: Ability to describe self-care measures that may prevent or decrease complications (Diabetes Survival Skills Education) will improve Outcome: Progressing Goal: Individualized Educational Video(s) Outcome: Progressing   Problem: Coping: Goal: Ability to adjust to condition or change in health will improve Outcome: Progressing   Problem: Fluid Volume: Goal: Ability to maintain a balanced intake and output will improve Outcome: Progressing   Problem: Health Behavior/Discharge Planning: Goal: Ability to identify and utilize available resources and services will improve Outcome: Progressing Goal: Ability to manage health-related needs will improve Outcome: Progressing   Problem: Metabolic: Goal: Ability to maintain appropriate glucose levels will improve Outcome: Progressing   Problem: Nutritional: Goal: Maintenance of adequate nutrition will improve Outcome: Progressing Goal: Progress toward achieving an  optimal weight will improve Outcome: Progressing   Problem: Skin Integrity: Goal: Risk for impaired skin integrity will decrease Outcome: Progressing   Problem: Tissue Perfusion: Goal: Adequacy of tissue perfusion will improve Outcome: Progressing   Problem: Education: Goal: Knowledge of General Education information will improve Description: Including pain rating scale, medication(s)/side effects and non-pharmacologic comfort measures Outcome: Progressing   Problem: Health Behavior/Discharge Planning: Goal: Ability to manage health-related needs will improve Outcome: Progressing   Problem: Clinical Measurements: Goal: Will remain free from infection Outcome: Progressing Goal: Diagnostic test results will improve Outcome: Progressing Goal: Cardiovascular complication will be avoided Outcome: Progressing   Problem: Activity: Goal: Risk for activity intolerance will decrease Outcome: Progressing   Problem: Nutrition: Goal: Adequate nutrition will be maintained Outcome: Progressing   Problem: Coping: Goal: Level of anxiety will decrease Outcome: Progressing   Problem: Elimination: Goal: Will not experience complications related to bowel motility Outcome: Progressing Goal: Will not experience complications related to urinary retention Outcome: Progressing   Problem: Safety: Goal: Ability to remain free from injury will improve Outcome: Progressing   Problem: Education: Goal: Knowledge of disease or condition will improve Outcome: Progressing Goal: Knowledge of the prescribed therapeutic regimen will improve Outcome: Progressing Goal: Individualized Educational Video(s) Outcome: Progressing   Problem: Activity: Goal: Ability to tolerate increased activity will improve Outcome: Progressing Goal: Will verbalize the importance of balancing activity with adequate rest periods Outcome: Progressing   Problem: Respiratory: Goal: Ability to maintain a clear airway  will improve Outcome: Progressing

## 2023-08-13 NOTE — Progress Notes (Addendum)
Patient having complaints of shortness of breath, states he can't breath, patient currently on 20 liters  heated high flow, patient does have history of COPD, Pulmonary fibrosis, eosinophilic pneumonitis, interstitial lung disease chronic Hypoxia,   pulmonary embolism's, deep vein thrombosis and is positive for respiratory syncytial virus, patient reposition in bed and made more comfortable.

## 2023-08-14 DIAGNOSIS — J9621 Acute and chronic respiratory failure with hypoxia: Secondary | ICD-10-CM | POA: Diagnosis not present

## 2023-08-14 DIAGNOSIS — J189 Pneumonia, unspecified organism: Secondary | ICD-10-CM | POA: Diagnosis not present

## 2023-08-14 LAB — CBC
HCT: 35.5 % — ABNORMAL LOW (ref 39.0–52.0)
Hemoglobin: 11.2 g/dL — ABNORMAL LOW (ref 13.0–17.0)
MCH: 28.4 pg (ref 26.0–34.0)
MCHC: 31.5 g/dL (ref 30.0–36.0)
MCV: 90.1 fL (ref 80.0–100.0)
Platelets: 329 10*3/uL (ref 150–400)
RBC: 3.94 MIL/uL — ABNORMAL LOW (ref 4.22–5.81)
RDW: 14.7 % (ref 11.5–15.5)
WBC: 6.6 10*3/uL (ref 4.0–10.5)
nRBC: 0 % (ref 0.0–0.2)

## 2023-08-14 LAB — GLUCOSE, CAPILLARY
Glucose-Capillary: 202 mg/dL — ABNORMAL HIGH (ref 70–99)
Glucose-Capillary: 283 mg/dL — ABNORMAL HIGH (ref 70–99)
Glucose-Capillary: 239 mg/dL — ABNORMAL HIGH (ref 70–99)

## 2023-08-14 LAB — PROTIME-INR
INR: 1.4 — ABNORMAL HIGH (ref 0.8–1.2)
Prothrombin Time: 17.7 s — ABNORMAL HIGH (ref 11.4–15.2)

## 2023-08-14 MED ORDER — PREDNISONE 10 MG PO TABS: 10.0000 mg | ORAL_TABLET | Freq: Every day | ORAL | Status: DC

## 2023-08-14 MED ORDER — PREDNISONE 20 MG PO TABS: 20.0000 mg | ORAL_TABLET | Freq: Every day | ORAL | Status: DC

## 2023-08-14 MED ORDER — PREDNISONE 5 MG PO TABS: 5.0000 mg | ORAL_TABLET | Freq: Every day | ORAL | Status: DC

## 2023-08-14 MED ORDER — METOPROLOL TARTRATE 5 MG/5ML IV SOLN: 5.0000 mg | Freq: Four times a day (QID) | INTRAVENOUS | Status: DC | PRN

## 2023-08-14 MED ORDER — PREDNISONE 20 MG PO TABS
40.0000 mg | ORAL_TABLET | Freq: Every day | ORAL | Status: AC
  Administered 2023-08-15 – 2023-08-17 (×3): 40 mg via ORAL
  Filled 2023-08-14 (×3): qty 2

## 2023-08-14 MED ORDER — INSULIN GLARGINE-YFGN 100 UNIT/ML ~~LOC~~ SOLN
15.0000 [IU] | Freq: Every day | SUBCUTANEOUS | Status: DC
  Administered 2023-08-14 – 2023-08-15 (×2): 15 [IU] via SUBCUTANEOUS
  Filled 2023-08-14 (×2): qty 0.15

## 2023-08-14 MED ORDER — OXYCODONE-ACETAMINOPHEN 7.5-325 MG PO TABS
1.0000 | ORAL_TABLET | ORAL | Status: DC
  Administered 2023-08-14 – 2023-08-16 (×12): 1 via ORAL
  Filled 2023-08-14 (×12): qty 1

## 2023-08-14 MED ORDER — PREDNISONE 20 MG PO TABS
30.0000 mg | ORAL_TABLET | Freq: Every day | ORAL | Status: AC
  Administered 2023-08-18 – 2023-08-20 (×3): 30 mg via ORAL
  Filled 2023-08-14 (×3): qty 1

## 2023-08-14 MED ORDER — WARFARIN SODIUM 5 MG PO TABS
7.5000 mg | ORAL_TABLET | Freq: Once | ORAL | Status: AC
  Administered 2023-08-14: 7.5 mg via ORAL
  Filled 2023-08-14: qty 1

## 2023-08-14 MED ORDER — METOPROLOL TARTRATE 12.5 MG HALF TABLET: 12.5000 mg | ORAL_TABLET | Freq: Two times a day (BID) | ORAL | Status: DC

## 2023-08-14 NOTE — Progress Notes (Addendum)
NAME:  Luis Brown, MRN:  [id number], DOB:  28-Nov-1945, LOS: 11 ADMISSION DATE:  08/03/2023, CONSULTATION DATE:  08/07/23 REFERRING MD:  TRH, CHIEF COMPLAINT:  acute on chronic hypoxic resp failure   History of Present Illness:  78 yo male presented a day ago with worsening sob. Pt was found to be worsning hypoxia not tolerant of NRB or NIV so was placed on hhfnc which has been sufficient thus far. However pt's mentation continues to decline, he is oriented only to self at this time. Family is en route. I have attempted to discuss with him code status but his mentation is prohibitive of this.   Pt is currently agitated pulling at lines, pulling at oxygen tubing despite mittens bilaterally. Saying " I just want to get home"  UPDATE:  D/w wife pt would never want cpr or intubation. Pt made DNR/DNI   Pertinent  Medical History  Eosinophilic penumonitis,  ILD Pulm fibrosis Copd -acute exacerbation,  Crhonic hypoxic resp failure Depression Anxiety Hyperlipidemia H/o pe with dvt  Significant Hospital Events: Including procedures, antibiotic start and stop dates in addition to other pertinent events   1/16 Admitted to hospital   1/20 with rapid decline req ccm consultation for resp distress. Added treatment for stenotrophamonas. Made DNR 1/21 better post adding rx for steno  1/22 [facility] wt 55L/min this am 1/23 Unable to sleep last night, remains on 60L [facility]  1/27 down to 3L [place]  Interim History / Subjective:  Feels quite good. Gets a bit dyspneic with exertion but stable at rest. Was on 10L O2 via [place] this AM but I turned him down to 3L and he is tolerating well. He is anxious to get out of bed with PT. Feels he might need some DME at least temporarily on discharge.  Objective   Blood pressure (!) 152/67, pulse (!) 126, temperature 97.8 F (36.6 C), temperature source Oral, resp. rate (!) 26, height 5\' 9"  (1.753 m), weight 90 kg, SpO2 95%.    FiO2 (%):  [31 %] 31 %    Intake/Output Summary (Last 24 hours) at 08/14/2023 1005 Last data filed at 08/14/2023 0408 Gross per 24 hour  Intake 1110.03 ml  Output 3325 ml  Net -2214.97 ml   Filed Weights   08/03/23 1857  Weight: 90 kg    Examination: General: Adult male, resting in bed, in NAD. Neuro: A&O x 3, no deficits. HEENT: [place]/AT. Sclerae anicteric. EOMI. Cardiovascular: RRR, no M/R/G.  Lungs: Respirations even and unlabored.  Faint crackles in bases. Abdomen: BS x 4, soft, NT/ND.  Musculoskeletal: No gross deformities, no edema.  Skin: Intact, warm, no rashes.   Resolved Hospital Problem list   Volume overload Staph pna Acute metabolic encephalopathy  Assessment & Plan:  Acute on chronic hypoxic resp failure ILD/IPF COPD HFpEF RSV pna Stenotrophamonas pna Atrial Fib on AC  Hyperglycemia  Anemia  Pulm/ccm prob list Acute on chronic hypoxic resp failure secondary to RSV viral PNA complicated further by secondary staph and stenotrophamonas PNA superimposed on Pulm fibrosis/ILD and COPD -started on Levaquin 1/20 w/ slow clinical improvement. Since that time systemic steroids, abx, BDs and diuresis have been therapeutic interventions. Completed ancef.  Plan Levaquin and minocycline for stenotrophamonas, stopping today after 7 day course Prednisone 40 mg daily, start taper tomorrow until back to baseline 5mg  daily Cont to wean O2 sat goal > 88% (does NOT need to aim for high 90s-100%) Cont Brovana, Pulmicort, and Yupelri Maintain euvolemia Encourage pulmonary hygiene  Mobilize as able, needs PT Might need home PT in the short term at least as well as DME (he feels he will need it short term)  Hypotension/circulatory shock - resolved. Of note, is -20L net since admit Plan Holding home anti-hypertensives Holding diuresis given net negative fluid balance since admission Was intermittently on vasopressors, last 1/25 and off since  Hyperglycemia Plan SSI + Semglee  Hx A.fib and DVT  - on Warfarin PTA Plan Continue Enoxaparin with bridge to PTA Warfarin per pharmacy   Rest per primary. PCCM to follow for pulm needs.   Rutherford Guys, PA - [facility] For pager details, please see AMION or use Epic chat  After 1900, please call [facility] for cross coverage needs 08/14/2023, 10:22 AM

## 2023-08-14 NOTE — Progress Notes (Signed)
Physical Therapy Treatment Patient Details Name: Luis Brown MRN: [id number] DOB: [birth year] Today's Date: 08/14/2023   History of Present Illness 78 yo male who presented with cough, congestion, and worsening shortness of breath.  He was tachycardic, tachypneic, and had leukocytosis on admission.  Positive for RSV.  PMH eosinophilic pneumonitis, ILD, pulmonary fibrosis, COPD, chronic hypoxia on 2 L O2 at home, PAF, history of PE and DVT on Coumadin, GERD, malnutrition, HLD, anxiety/depression, PMR    PT Comments  Pt agreeable to working with therapy. He is highly motivated to mobilize and regain his PLOF. He has some weakness and he fatigues very quickly/easily with activity. During session: O2 90% on 4L, HR 137 bpm, dyspnea 3/4 with short distance ambulation. He did report some dizziness during session as well. Wife present to observe. Encouraged him to perform APs, QSs and breathing exercises throughout the day as tolerated. Will continue to follow and progress activity as safely able.     If plan is discharge home, recommend the following: A little help with walking and/or transfers;A little help with bathing/dressing/bathroom;Help with stairs or ramp for entrance;Assistance with cooking/housework   Can travel by private vehicle        Equipment Recommendations  None recommended by PT    Recommendations for Other Services       Precautions / Restrictions Precautions Precautions: Fall Precaution Comments: chronic 2L O2; monitor HR Restrictions Weight Bearing Restrictions Per Provider Order: No     Mobility  Bed Mobility Overal bed mobility: Needs Assistance Bed Mobility: Supine to Sit     Supine to sit: Supervision     General bed mobility comments: supervision for lines only    Transfers Overall transfer level: Needs assistance Equipment used: Rolling walker (2 wheels) Transfers: Sit to/from Stand Sit to Stand: Min assist           General transfer comment:  Light assist to steady. Cues for safety, hand placement.    Ambulation/Gait Ambulation/Gait assistance: Min assist, +2 safety/equipment Gait Distance (Feet): 5 Feet (x2) Assistive device: Rolling walker (2 wheels)         General Gait Details: Fatigues quickly. Close recliner follow. O2 90% on 4L, HR 137 bpm, dyspnea 3/4. Cuse for slow, pursed lip breathing.   Stairs             Wheelchair Mobility     Tilt Bed    Modified Rankin (Stroke Patients Only)       Balance Overall balance assessment: Needs assistance         Standing balance support: Bilateral upper extremity supported, Reliant on assistive device for balance, During functional activity Standing balance-Leahy Scale: Poor                              Cognition Arousal: Alert Behavior During Therapy: WFL for tasks assessed/performed Overall Cognitive Status: Within Functional Limits for tasks assessed                                          Exercises Total Joint Exercises Ankle Circles/Pumps: AROM, Both, 5 reps Quad Sets: AROM, Both, 5 reps    General Comments        Pertinent Vitals/Pain Pain Assessment Pain Assessment: No/denies pain    Home Living  Prior Function            PT Goals (current goals can now be found in the care plan section) Progress towards PT goals: Progressing toward goals    Frequency    Min 1X/week      PT Plan      Co-evaluation              AM-PAC PT "6 Clicks" Mobility   Outcome Measure  Help needed turning from your back to your side while in a flat bed without using bedrails?: A Little Help needed moving from lying on your back to sitting on the side of a flat bed without using bedrails?: A Little Help needed moving to and from a bed to a chair (including a wheelchair)?: A Little Help needed standing up from a chair using your arms (e.g., wheelchair or bedside chair)?: A  Little Help needed to walk in hospital room?: A Lot Help needed climbing 3-5 steps with a railing? : A Lot 6 Click Score: 16    End of Session Equipment Utilized During Treatment: Gait belt;Oxygen Activity Tolerance: Patient limited by fatigue Patient left: in chair;with call bell/phone within reach;with family/visitor present   PT Visit Diagnosis: Difficulty in walking, not elsewhere classified (R26.2)     Time: 5956-3875 PT Time Calculation (min) (ACUTE ONLY): 22 min  Charges:    $Gait Training: 8-22 mins PT General Charges $$ ACUTE PT VISIT: 1 Visit                        Faye Ramsay, PT Acute Rehabilitation  Office: [phone]

## 2023-08-14 NOTE — Progress Notes (Addendum)
PROGRESS NOTE    Luis Brown  ZOX:[id number] DOB: January 15, 1946 DOA: 08/03/2023 PCP: Bradd Canary, MD    Brief Narrative:  78 yo male with PMH eosinophilic pneumonitis, ILD, pulmonary fibrosis, COPD, chronic hypoxia on 2 L O2 at home, PAF, history of PE and DVT on Coumadin, GERD, malnutrition, HLD, anxiety/depression, PMR who presented with cough, congestion, and worsening shortness of breath. He was tachycardic, tachypneic, and had leukocytosis on admission. CXR showed diffuse interstitial opacities.  Respiratory swabs were positive for RSV. He was started on steroids, nebs, and antibiotics.  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   Paroxysmal atrial fibrillation (HCC)   Hyponatremia   Insomnia   BPH (benign prostatic hyperplasia)   GERD (gastroesophageal reflux disease)   History of DVT (deep vein thrombosis)   Hyperlipidemia   Pulmonary fibrosis (HCC)   History of pulmonary embolism   Hypochloremia   Generalized anxiety disorder   Insulin dependent type 2 diabetes mellitus (HCC)   RSV (respiratory syncytial virus pneumonia)   #1 sepsis secondary to viral RSV pneumonia superimposed with MSSA and stenotrophomonas pneumonia in the setting of interstitial lung disease His oxygen requirement is improving he is down to 3 L from 60 L high flow nasal cannula.  Sputum culture grew stenotrophomonas and Staph aureus  Currently on Levaquin and minocycline for stenotrophomonas and MSSA  last dose 1/27 PT consulted  #2 interstitial lung disease in the setting of COPD and pulmonary fibrosis PCCM following appreciate input continue steroids nebs Brovana Pulmicort and Yupelri Repeat chest xray 1/25 improving interstitial opacities greatest residual in the upper lung zones and left lung  base  #3 history of DVT and pulmonary embolism continue coumadin.  INR 1.4.  Continue Lovenox with Coumadin until INR therapeutic.  Pharmacy following.  #4 paroxysmal atrial fibrillation continue coumadin  #5 type 2 diabetes A1c 8.8 increased Semglee 15 units  and NovoLog 4 units 3 times daily CBG (last 3)  Recent Labs    08/13/23 1628 08/13/23 2142 08/14/23 0746  GLUCAP 246* 159* 202*    #6 hyperlipidemia not on any medications but has been controlled with last LDL of 76 triglycerides 110  #7 BPH continue Flomax, Foley was placed for acute urinary retention when he was acutely sick discussed with him regarding removal of Foley he refuses to have Foley taken out as he feels he is still physically not able to run to the restroom every 30 minutes  #8 generalized anxiety disorder and insomnia on Effexor and Wellbutrin  #9 peripheral neuropathy on Lyrica and Zanaflex  #10 history of diastolic heart failure has been getting as needed Lasix  Pressure Injury 08/12/23 Buttocks Right;Posterior;Medial Stage 2 -  Partial thickness loss of dermis presenting as a shallow open injury with a red, pink wound bed without slough. 2.54 centimeters pink/red (Active)  08/12/23 2200  Location: Buttocks  Location Orientation: Right;Posterior;Medial  Staging: Stage 2 -  Partial thickness loss of dermis presenting as a shallow open injury with a red, pink wound bed without slough.  Wound Description (Comments): 2.54 centimeters pink/red  Present on Admission: No  Dressing Type Foam - Lift dressing to assess site every shift 08/14/23 0800  #11 CODE STATUS patient DNR  #12 hyponatremia resolved  #13 hypokalemia repleted  #14 insomnia restart Ambien when he was taking  at home 5mg  nightly  #15 hypotension resolved patient was being diuresed aggressively diuresis held Cozaar held Not on pressors as of 08/12/2023   Estimated body mass index is 29.3 kg/m as calculated from the following:   Height as of  this encounter: 5\' 9"  (1.753 m).   Weight as of this encounter: 90 kg.  DVT prophylaxis: Lovenox Code Status: DN R Family Communication: Discussed with wife disposition Plan:  Status is: Inpatient Remains inpatient appropriate because: Acute illness   Consultants:  PCCM  Procedures: None Antimicrobials: None Subjective:  Does not want Foley taken out yet as he still feels physically weak and not able to go to the restroom to pee, he is aware of UTI with prolonged Foley in place objective: Vitals:   08/14/23 1000 08/14/23 1013 08/14/23 1100 08/14/23 1105  BP: (!) 113/51  (!) 145/62   Pulse: (!) 101 94 (!) 105 (!) 104  Resp: (!) 29 18 (!) 24 (!) 24  Temp:      TempSrc:      SpO2: 99% 98% (!) 89% 95%  Weight:      Height:        Intake/Output Summary (Last 24 hours) at 08/14/2023 1204 Last data filed at 08/14/2023 0408 Gross per 24 hour  Intake 1110.03 ml  Output 3325 ml  Net -2214.97 ml   Filed Weights   08/03/23 1857  Weight: 90 kg    Examination:  General exam: Appears in mild distress  respiratory system: rhonchi Cardiovascular system: Tachy gastrointestinal system: Abdomen is nondistended, soft and nontender. No organomegaly or masses felt. Normal bowel sounds heard. Central nervous system: Alert and oriented. No focal neurological deficits. Extremities: No edema   Data Reviewed: I have personally reviewed following labs and imaging studies  CBC: Recent Labs  Lab 08/08/23 0306 08/09/23 0316 08/11/23 0319 08/14/23 0246  WBC 10.1 15.3* 10.1 6.6  NEUTROABS 9.3* 13.7*  --   --   HGB 11.9* 12.9* 11.4* 11.2*  HCT 35.9* 40.5 36.4* 35.5*  MCV 86.9 91.0 91.0 90.1  PLT 332 459* 327 329   Basic Metabolic Panel: Recent Labs  Lab 08/08/23 0306 08/09/23 0316 08/10/23 0308 08/11/23 0319  NA 135 135 136 134*  K 3.9 3.5 3.3* 3.9  CL 98 96* 96* 97*  CO2 28 27 29 28   GLUCOSE 244* 123* 182* 191*  BUN 25* 28* 21 24*  CREATININE 0.50* 0.43* 0.47* 0.45*   CALCIUM 8.5* 8.3* 8.3* 8.0*  MG 2.2 2.0 2.1  --    GFR: Estimated Creatinine Clearance: 83 mL/min (A) (by C-G formula based on SCr of 0.45 mg/dL (L)). Liver Function Tests: No results for input(s): "AST", "ALT", "ALKPHOS", "BILITOT", "PROT", "ALBUMIN" in the last 168 hours.  No results for input(s): "LIPASE", "AMYLASE" in the last 168 hours. No results for input(s): "AMMONIA" in the last 168 hours. Coagulation Profile: Recent Labs  Lab 08/10/23 0308 08/11/23 0319 08/12/23 0320 08/13/23 0245 08/14/23 0246  INR 1.6* 1.4* 1.3* 1.4* 1.4*   Cardiac Enzymes: No results for input(s): "CKTOTAL", "CKMB", "CKMBINDEX", "TROPONINI" in the last 168 hours. BNP (last 3 results) Recent Labs    05/31/23 1534  PROBNP 40.0   HbA1C: No results for input(s): "HGBA1C" in the last 72 hours. CBG: Recent Labs  Lab 08/13/23 0825 08/13/23 1246 08/13/23 1628 08/13/23 2142 08/14/23 0746  GLUCAP 207* 250* 246* 159* 202*   Lipid Profile: No results for input(s): "CHOL", "HDL", "LDLCALC", "TRIG", "CHOLHDL", "LDLDIRECT" in the last 72 hours. Thyroid Function  Tests: No results for input(s): "TSH", "T4TOTAL", "FREET4", "T3FREE", "THYROIDAB" in the last 72 hours. Anemia Panel: No results for input(s): "VITAMINB12", "FOLATE", "FERRITIN", "TIBC", "IRON", "RETICCTPCT" in the last 72 hours. Sepsis Labs: No results for input(s): "PROCALCITON", "LATICACIDVEN" in the last 168 hours.   No results found for this or any previous visit (from the past 240 hours).        Radiology Studies: DG Chest 1 View Result Date: 08/12/2023 CLINICAL DATA:  Hypoxia. Cough and congestion. Worsening shortness of breath. EXAM: CHEST  1 VIEW COMPARISON:  08/07/2023 FINDINGS: Lung volumes are low. Improving interstitial opacities, greatest residual in the right greater than left upper lobes and left lung base. No new airspace disease. Stable heart size and mediastinal contours. No pneumothorax or significant pleural  effusion. Thoracolumbar fusion hardware. IMPRESSION: Improving interstitial opacities, greatest residual in the upper lung zones and left lung base. No new abnormalities. Electronically Signed   By: Narda Rutherford M.D.   On: 08/12/2023 16:50     Scheduled Meds:  arformoterol  15 mcg Nebulization BID   budesonide (PULMICORT) nebulizer solution  0.5 mg Nebulization BID   busPIRone  15 mg Oral TID   Chlorhexidine Gluconate Cloth  6 each Topical Daily   dextromethorphan-guaiFENesin  2 tablet Oral BID   enoxaparin (LOVENOX) injection  90 mg Subcutaneous Q12H   insulin aspart  0-20 Units Subcutaneous TID WC   insulin aspart  0-5 Units Subcutaneous QHS   insulin aspart  4 Units Subcutaneous TID WC   insulin glargine-yfgn  15 Units Subcutaneous Daily   minocycline  200 mg Oral BID   oxyCODONE-acetaminophen  1 tablet Oral Q4H   [START ON 08/15/2023] predniSONE  40 mg Oral Q breakfast   Followed by   Melene Muller ON 08/18/2023] predniSONE  30 mg Oral Q breakfast   Followed by   Melene Muller ON 08/21/2023] predniSONE  20 mg Oral Q breakfast   Followed by   Melene Muller ON 08/24/2023] predniSONE  10 mg Oral Q breakfast   Followed by   Melene Muller ON 08/27/2023] predniSONE  5 mg Oral Q breakfast   pregabalin  75 mg Oral BID   revefenacin  175 mcg Nebulization Daily   tamsulosin  0.8 mg Oral QPM   tiZANidine  6 mg Oral Q4H   venlafaxine XR  150 mg Oral Q breakfast   warfarin  7.5 mg Oral ONCE-1600   Warfarin - Pharmacist Dosing Inpatient   Does not apply q1600   zolpidem  5 mg Oral QHS   Continuous Infusions:     LOS: 11 days    Time spent: 39 min Alwyn Ren, MD 08/14/2023, 12:04 PM

## 2023-08-14 NOTE — Plan of Care (Signed)

## 2023-08-14 NOTE — Progress Notes (Signed)
PHARMACY - ANTICOAGULATION CONSULT NOTE  Pharmacy Consult for Warfarin + enoxaparin bridge Indication: atrial fibrillation and hx DVT  Allergies  Allergen Reactions   Daptomycin     Drug induced pneumonia    Diltiazem Other (See Comments)    Passed out   Ofev [Nintedanib] Diarrhea    fatigue   Pirfenidone Diarrhea    fatigue    Patient Measurements: Height: 5\' 9"  (175.3 cm) Weight: 90 kg (198 lb 6.6 oz) IBW/kg (Calculated) : 70.7  Vital Signs: Temp: 98.2 F (36.8 C) (01/27 0310) Temp Source: Oral (01/27 0310) BP: 144/70 (01/27 0600) Pulse Rate: 71 (01/27 0600)  Labs: Recent Labs    08/12/23 0320 08/13/23 0245 08/14/23 0246  HGB  --   --  11.2*  HCT  --   --  35.5*  PLT  --   --  329  LABPROT 16.7* 17.2* 17.7*  INR 1.3* 1.4* 1.4*    Estimated Creatinine Clearance: 83 mL/min (A) (by C-G formula based on SCr of 0.45 mg/dL (L)).   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   Esophageal reflux 02/10/2015   Fatigue    HTN (hypertension)    Hyperglycemia    preDM    Hyperlipidemia    Insomnia    Low back pain radiating to both legs 01/16/2017   Measles as a child   Overweight(278.02)    Peripheral neuropathy 08/07/2019   Personal history of colonic polyps 10/27/2012   Follows with [facility]   Pneumonia    " walking"   Preventative health care    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   Wears glasses     Medications:  Scheduled:   arformoterol  15 mcg Nebulization BID   budesonide (PULMICORT)  nebulizer solution  0.5 mg Nebulization BID   busPIRone  15 mg Oral TID   Chlorhexidine Gluconate Cloth  6 each Topical Daily   dextromethorphan-guaiFENesin  2 tablet Oral BID   enoxaparin (LOVENOX) injection  90 mg Subcutaneous Q12H   insulin aspart  0-20 Units Subcutaneous TID WC   insulin aspart  0-5 Units Subcutaneous QHS   insulin aspart  4 Units Subcutaneous TID WC   insulin glargine-yfgn  10 Units Subcutaneous Daily   minocycline  200 mg Oral BID   oxyCODONE-acetaminophen  1 tablet Oral Q4H   predniSONE  40 mg Oral Q breakfast   pregabalin  75 mg Oral BID   revefenacin  175 mcg Nebulization Daily   tamsulosin  0.8 mg Oral QPM   tiZANidine  6 mg Oral Q4H   venlafaxine XR  150 mg Oral Q breakfast   Warfarin - Pharmacist Dosing Inpatient   Does not apply q1600   zolpidem  5 mg Oral QHS     Assessment: Patient is a 79 yoM admitted on 1/16 with acute on chronic respiratory failure. Pt found to have RSV as well as PNA (MSSA, stenotrophomonas) completing 7 days of antibiotics on 08/13/22.   PMH significant for atrial  fibrillation, hx of PE/DVT anticoagulated PTA with warfarin. Warfarin was held while pt unable to take PO medications, resumed on 1/23. Pharmacy consulted to dose warfarin, with enoxaparin bridge while INR subtherapeutic.   Home warfarin dose: 5mg  daily except 7.5 mg on Tuesdays and Saturdays.  -INR on admission: 3.3  Anticoagulation During Admission: -Warfarin held: 1/16 & 1/17, 1/20 > 1/22 -Enoxaparin 1/20 >>  Today, 08/14/23: INR = 1.4 remains subtherapeutic  CBC: Stable, SCr low Drug interactions:  Levofloxacin course completed 1/26, minocycline to complete today. Both antibiotics have potential to increase INR No bleeding reported  Goal of Therapy:  INR 2-3 Monitor platelets by anticoagulation protocol: Yes   Plan:  Continue enoxaparin 90 mg (1 mg/kg) subQ q12h until INR is therapeutic Warfarin 7.5 mg PO today SCr with AM labs tomorrow. INR  daily Monitor CBC  Cindi Carbon, PharmD 08/14/23 8:49 AM

## 2023-08-15 ENCOUNTER — Other Ambulatory Visit: Payer: Self-pay | Admitting: Family Medicine

## 2023-08-15 DIAGNOSIS — J189 Pneumonia, unspecified organism: Secondary | ICD-10-CM | POA: Diagnosis not present

## 2023-08-15 DIAGNOSIS — I959 Hypotension, unspecified: Secondary | ICD-10-CM | POA: Diagnosis not present

## 2023-08-15 DIAGNOSIS — R739 Hyperglycemia, unspecified: Secondary | ICD-10-CM

## 2023-08-15 DIAGNOSIS — J9621 Acute and chronic respiratory failure with hypoxia: Secondary | ICD-10-CM | POA: Diagnosis not present

## 2023-08-15 LAB — GLUCOSE, CAPILLARY
Glucose-Capillary: 208 mg/dL — ABNORMAL HIGH (ref 70–99)
Glucose-Capillary: 167 mg/dL — ABNORMAL HIGH (ref 70–99)
Glucose-Capillary: 233 mg/dL — ABNORMAL HIGH (ref 70–99)
Glucose-Capillary: 317 mg/dL — ABNORMAL HIGH (ref 70–99)
Glucose-Capillary: 241 mg/dL — ABNORMAL HIGH (ref 70–99)
Glucose-Capillary: 156 mg/dL — ABNORMAL HIGH (ref 70–99)

## 2023-08-15 LAB — CBC
HCT: 33.3 % — ABNORMAL LOW (ref 39.0–52.0)
Hemoglobin: 10.5 g/dL — ABNORMAL LOW (ref 13.0–17.0)
MCH: 28.5 pg (ref 26.0–34.0)
MCHC: 31.5 g/dL (ref 30.0–36.0)
MCV: 90.5 fL (ref 80.0–100.0)
Platelets: 342 10*3/uL (ref 150–400)
RBC: 3.68 MIL/uL — ABNORMAL LOW (ref 4.22–5.81)
RDW: 14.7 % (ref 11.5–15.5)
WBC: 7.3 10*3/uL (ref 4.0–10.5)
nRBC: 0 % (ref 0.0–0.2)

## 2023-08-15 LAB — COMPREHENSIVE METABOLIC PANEL
ALT: 10 U/L (ref 0–44)
AST: 14 U/L — ABNORMAL LOW (ref 15–41)
Albumin: 2.6 g/dL — ABNORMAL LOW (ref 3.5–5.0)
Alkaline Phosphatase: 53 U/L (ref 38–126)
Anion gap: 7 (ref 5–15)
BUN: 16 mg/dL (ref 8–23)
CO2: 29 mmol/L (ref 22–32)
Calcium: 7.7 mg/dL — ABNORMAL LOW (ref 8.9–10.3)
Chloride: 96 mmol/L — ABNORMAL LOW (ref 98–111)
Creatinine, Ser: 0.43 mg/dL — ABNORMAL LOW (ref 0.61–1.24)
GFR, Estimated: >60 mL/min (ref >60)
Glucose, Bld: 226 mg/dL — ABNORMAL HIGH (ref 70–99)
Potassium: 3.7 mmol/L (ref 3.5–5.1)
Sodium: 132 mmol/L — ABNORMAL LOW (ref 135–145)
Total Bilirubin: 0.6 mg/dL (ref 0.0–1.2)
Total Protein: 4.8 g/dL — ABNORMAL LOW (ref 6.5–8.1)

## 2023-08-15 LAB — PROTIME-INR
INR: 1.7 — ABNORMAL HIGH (ref 0.8–1.2)
Prothrombin Time: 19.9 s — ABNORMAL HIGH (ref 11.4–15.2)

## 2023-08-15 MED ORDER — WARFARIN SODIUM 5 MG PO TABS
5.0000 mg | ORAL_TABLET | Freq: Once | ORAL | Status: AC
  Administered 2023-08-15: 5 mg via ORAL
  Filled 2023-08-15: qty 1

## 2023-08-15 MED ORDER — INSULIN GLARGINE-YFGN 100 UNIT/ML ~~LOC~~ SOLN
18.0000 [IU] | Freq: Every day | SUBCUTANEOUS | Status: DC
  Administered 2023-08-16 – 2023-08-18 (×3): 18 [IU] via SUBCUTANEOUS
  Filled 2023-08-15 (×3): qty 0.18

## 2023-08-15 MED ORDER — CALCIUM GLUCONATE-NACL 1-0.675 GM/50ML-% IV SOLN
1.0000 g | Freq: Once | INTRAVENOUS | Status: AC
  Administered 2023-08-15: 1000 mg via INTRAVENOUS
  Filled 2023-08-15: qty 50

## 2023-08-15 NOTE — Progress Notes (Signed)
   Inpatient Rehab Admissions Coordinator :  Per therapy recommendations, patient was screened for CIR candidacy by Ottie Glazier RN MSN.  At this time patient appears to be a potential candidate for CIR. I will place a rehab consult per protocol for full assessment. Please call me with any questions.  Ottie Glazier RN MSN Admissions Coordinator [phone]

## 2023-08-15 NOTE — Progress Notes (Signed)
Physical Therapy Treatment Patient Details Name: Luis Brown MRN: [id number] DOB: 1946/01/31 Today's Date: 08/15/2023   History of Present Illness Patient is a 78 year old male who presented with cough, congestion, and worsening shortness of breath.  He was tachycardic, tachypneic, and had leukocytosis on admission.  Positive for RSV.  Dx: Acute on chronic hypoxic resp failure  secondary to RSV viral PNA complicated further by secondary staph and stenotrophamonas PNA superimposed on Pulm fibrosis/ILD and COPD.  PMH eosinophilic pneumonitis, ILD, pulmonary fibrosis, COPD, chronic hypoxia on 2 L O2 at home, PAF, history of PE and DVT on Coumadin, GERD, malnutrition, HLD, anxiety/depression, PMR    PT Comments  Pt agreeable to working with therapy. Wife present during session. Pt reports good session with OT earlier today. Eager to work on Contractor. Remained on 3L [place] O2 this session-sats down to 87%. HR up to 150 bpm. Dyspnea 3/4. Rest breaks taken as needed/encouraged. Cues for pursed lip breathing. Min assist +2 on today for safe mobility and equipment management. Pt presents with general weakness, decreased activity tolerance, and impaired gait and balance. Discussed d/c plan with pt and wife. Feel pt could be a good CIR candidate-pt was Ind at baseline. He is highly motivated to regain his independence and prior level of function.     If plan is discharge home, recommend the following: A little help with walking and/or transfers;A little help with bathing/dressing/bathroom;Help with stairs or ramp for entrance;Assistance with cooking/housework   Can travel by private vehicle        Equipment Recommendations  None recommended by PT    Recommendations for Other Services Rehab consult     Precautions / Restrictions Precautions Precautions: Fall Precaution Comments: chronic 2L O2; monitor HR Restrictions Weight Bearing Restrictions Per Provider Order: No     Mobility   Bed Mobility               General bed mobility comments: oob in recliner    Transfers Overall transfer level: Needs assistance Equipment used: Rolling walker (2 wheels) Transfers: Sit to/from Stand Sit to Stand: Min assist           General transfer comment: Light assist to steady. Cues for safety, hand placement.    Ambulation/Gait Ambulation/Gait assistance: Min assist, +2 physical assistance, +2 safety/equipment Gait Distance (Feet): 10 Feet (x2) Assistive device: Rolling walker (2 wheels) Gait Pattern/deviations: Step-to pattern, Decreased stride length       General Gait Details: O2 87% on 3L, HR 150 bpm, dyspnea 3/4. Fatigues quickly. Cues for slow, pursed lip breathing. Assist to steady pt throughout distance and to follow with recliner. Seated rest break in recliner between walks. Cues for pursed lip breathing.   Stairs             Wheelchair Mobility     Tilt Bed    Modified Rankin (Stroke Patients Only)       Balance Overall balance assessment: Needs assistance         Standing balance support: Bilateral upper extremity supported, Reliant on assistive device for balance, During functional activity Standing balance-Leahy Scale: Poor                              Cognition Arousal: Alert Behavior During Therapy: WFL for tasks assessed/performed Overall Cognitive Status: Within Functional Limits for tasks assessed  Exercises      General Comments        Pertinent Vitals/Pain Pain Assessment Pain Assessment: No/denies pain    Home Living Family/patient expects to be discharged to:: Private residence Living Arrangements: Spouse/significant other Available Help at Discharge: Family;Available 24 hours/day Type of Home: House Home Access: Stairs to enter Entrance Stairs-Rails: Right Entrance Stairs-Number of Steps: 2   Home Layout: One level Home Equipment:  Agricultural consultant (2 wheels)      Prior Function            PT Goals (current goals can now be found in the care plan section) Progress towards PT goals: Progressing toward goals    Frequency    Min 1X/week      PT Plan      Co-evaluation              AM-PAC PT "6 Clicks" Mobility   Outcome Measure  Help needed turning from your back to your side while in a flat bed without using bedrails?: A Little Help needed moving from lying on your back to sitting on the side of a flat bed without using bedrails?: A Little Help needed moving to and from a bed to a chair (including a wheelchair)?: A Little Help needed standing up from a chair using your arms (e.g., wheelchair or bedside chair)?: A Little Help needed to walk in hospital room?: A Lot Help needed climbing 3-5 steps with a railing? : A Lot 6 Click Score: 16    End of Session Equipment Utilized During Treatment: Gait belt;Oxygen Activity Tolerance: Patient tolerated treatment well;Patient limited by fatigue Patient left: in chair;with call bell/phone within reach;with family/visitor present   PT Visit Diagnosis: Difficulty in walking, not elsewhere classified (R26.2)     Time: 1610-9604 PT Time Calculation (min) (ACUTE ONLY): 24 min  Charges:    $Gait Training: 23-37 mins PT General Charges $$ ACUTE PT VISIT: 1 Visit                         Faye Ramsay, PT Acute Rehabilitation  Office: [phone]

## 2023-08-15 NOTE — Progress Notes (Signed)
  Inpatient Rehabilitation Admissions Coordinator   I spoke with patient's wife by phone . Unable to reach patient by phone. Prior to admit patient very active and uses O2 at 2 liters [place] at HS only. On long term prednisone.Very active.  We discussed goals and expectations of a possible CIR admit. They prefer CIR for rehab. Family can provide expected caregiver support that is recommended . I will ask Rehab MD to place their recommendations in chart 1/29 and I will begin Auth 1/29 with [facility] medicare for possible admit. Please call me with any questions.   Ottie Glazier, RN, MSN Rehab Admissions Coordinator [phone]

## 2023-08-15 NOTE — Progress Notes (Signed)
PROGRESS NOTE    PAMELA MADDY  ZOX:[id number] DOB: [birth year] DOA: 08/03/2023 PCP: Bradd Canary, MD    Brief Narrative:  78 yo male with PMH eosinophilic pneumonitis, ILD, pulmonary fibrosis, COPD, chronic hypoxia on 2 L O2 at home, PAF, history of PE and DVT on Coumadin, GERD, malnutrition, HLD, anxiety/depression, PMR who presented with cough, congestion, and worsening shortness of breath. He was tachycardic, tachypneic, and had leukocytosis on admission. CXR showed diffuse interstitial opacities.  Respiratory swabs were positive for RSV. He was started on steroids, nebs, and antibiotics.  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   Paroxysmal atrial fibrillation (HCC)   Hyponatremia   Insomnia   BPH (benign prostatic hyperplasia)   GERD (gastroesophageal reflux disease)   History of DVT (deep vein thrombosis)   Hyperlipidemia   Pulmonary fibrosis (HCC)   History of pulmonary embolism   Hypochloremia   Generalized anxiety disorder   Insulin dependent type 2 diabetes mellitus (HCC)   RSV (respiratory syncytial virus pneumonia)   #1 sepsis secondary to viral RSV pneumonia superimposed with MSSA and stenotrophomonas pneumonia in the setting of interstitial lung disease His oxygen requirement is improving he is down to 3 L from 60 L high flow nasal cannula.  Sputum culture grew stenotrophomonas and Staph aureus  Currently on Levaquin and minocycline for stenotrophomonas and MSSA  last dose 1/27 PT consulted will need home health PT on discharge  #2 interstitial lung disease in the setting of COPD and pulmonary fibrosis PCCM following appreciate input continue steroids nebs Brovana Pulmicort and Yupelri Repeat chest xray 1/25 improving interstitial opacities greatest residual in  the upper lung zones and left lung base  #3 history of DVT and pulmonary embolism continue coumadin.  INR 1.7  Continue Lovenox with Coumadin until INR therapeutic.  Pharmacy following.  #4 paroxysmal atrial fibrillation continue coumadin, patient has not been on any medications at home for rate control.  He is intermittently tachycardic and is getting IV Lopressor as needed.    #5 type 2 diabetes A1c 8.8 increased Semglee 15 units  and NovoLog 4 units 3 times daily CBG (last 3)  Recent Labs    08/14/23 1156 08/14/23 2142 08/15/23 0733  GLUCAP 283* 239* 167*    #6 hyperlipidemia not on any medications but has been controlled with last LDL of 76 triglycerides 110  #7 BPH continue Flomax, Foley was placed for acute urinary retention when he was acutely sick discussed with him regarding removal of Foley he refuses to have Foley taken out as he feels he is still physically not able to run to the restroom every 30 minutes  #8 generalized anxiety disorder and insomnia on Effexor and Wellbutrin  #9 peripheral neuropathy on Lyrica and Zanaflex  #10 history of diastolic heart failure stable diuresis has been on hold as patient went into hypotension needing pressors.  #11 hypotension has been resolved currently not on any antihypertensives This was thought to be due to aggressive diuresis and antihypertensives on board.  This was treated with IV fluids and pressors.   Pressure Injury 08/12/23 Buttocks Right;Posterior;Medial Stage 2 -  Partial thickness loss of dermis presenting as a shallow open injury with a red, pink wound bed without slough. 2.54 centimeters pink/red (Active)  08/12/23 2200  Location: Buttocks  Location Orientation: Right;Posterior;Medial  Staging: Stage 2 -  Partial thickness loss of dermis presenting as a shallow open injury with a red, pink wound bed without slough.  Wound Description (Comments): 2.54 centimeters pink/red  Present on Admission: No  Dressing Type Foam  - Lift dressing to assess site every shift 08/15/23 0800  #11 CODE STATUS patient DNR  #12 hyponatremia resolved  #13 hypokalemia repleted  #14 insomnia restart Ambien when he was taking at home 5mg  nightly  #15 hypotension resolved patient was being diuresed aggressively diuresis held Cozaar held Not on pressors as of 08/12/2023   Estimated body mass index is 27.54 kg/m as calculated from the following:   Height as of this encounter: 5\' 9"  (1.753 m).   Weight as of this encounter: 84.6 kg.  DVT prophylaxis: Lovenox Code Status: DN R Family Communication: Discussed with wife disposition Plan:  Status is: Inpatient Remains inpatient appropriate because: Acute illness   Consultants:  PCCM  Procedures: None Antimicrobials: None Subjective:  93% on 4 L awake alert anxious to eat breakfast feels better feels well however does not want to have Foley taken out today either Denies shortness of breath Slept well objective: Vitals:   08/15/23 0500 08/15/23 0600 08/15/23 0700 08/15/23 0800  BP: (!) 154/57 121/74 119/60 (!) 147/72  Pulse: 72 72 74 81  Resp: 19 17 18 11   Temp:    97.9 F (36.6 C)  TempSrc:    Oral  SpO2: 98% 100% 96% 93%  Weight: 84.6 kg     Height:        Intake/Output Summary (Last 24 hours) at 08/15/2023 0954 Last data filed at 08/15/2023 0500 Gross per 24 hour  Intake --  Output 3550 ml  Net -3550 ml   Filed Weights   08/03/23 1857 08/15/23 0500  Weight: 90 kg 84.6 kg    Examination:  General exam: Appears in mild distress  respiratory system: rhonchi Cardiovascular system: Tachy gastrointestinal system: Abdomen is nondistended, soft and nontender. No organomegaly or masses felt. Normal bowel sounds heard. Central nervous system: Alert and oriented. No focal neurological deficits. Extremities: No edema   Data Reviewed: I have personally reviewed following labs and imaging studies  CBC: Recent Labs  Lab 08/09/23 0316 08/11/23 0319  08/14/23 0246 08/15/23 0309  WBC 15.3* 10.1 6.6 7.3  NEUTROABS 13.7*  --   --   --   HGB 12.9* 11.4* 11.2* 10.5*  HCT 40.5 36.4* 35.5* 33.3*  MCV 91.0 91.0 90.1 90.5  PLT 459* 327 329 342   Basic Metabolic Panel: Recent Labs  Lab 08/09/23 0316 08/10/23 0308 08/11/23 0319 08/15/23 0309  NA 135 136 134* 132*  K 3.5 3.3* 3.9 3.7  CL 96* 96* 97* 96*  CO2 27 29 28 29   GLUCOSE 123* 182* 191* 226*  BUN 28* 21 24* 16  CREATININE 0.43* 0.47* 0.45* 0.43*  CALCIUM 8.3* 8.3* 8.0* 7.7*  MG 2.0 2.1  --   --    GFR: Estimated Creatinine Clearance: 74.9 mL/min (A) (by C-G formula based on SCr of 0.43 mg/dL (L)). Liver Function Tests: Recent Labs  Lab 08/15/23 0309  AST 14*  ALT 10  ALKPHOS 53  BILITOT 0.6  PROT 4.8*  ALBUMIN 2.6*    No results for input(s): "LIPASE", "AMYLASE" in the last 168 hours. No results for input(s): "AMMONIA" in the last 168 hours. Coagulation Profile: Recent Labs  Lab 08/11/23 0319 08/12/23 0320 08/13/23 0245 08/14/23 0246 08/15/23 0309  INR 1.4* 1.3* 1.4* 1.4* 1.7*  Cardiac Enzymes: No results for input(s): "CKTOTAL", "CKMB", "CKMBINDEX", "TROPONINI" in the last 168 hours. BNP (last 3 results) Recent Labs    05/31/23 1534  PROBNP 40.0   HbA1C: No results for input(s): "HGBA1C" in the last 72 hours. CBG: Recent Labs  Lab 08/13/23 2142 08/14/23 0746 08/14/23 1156 08/14/23 2142 08/15/23 0733  GLUCAP 159* 202* 283* 239* 167*   Lipid Profile: No results for input(s): "CHOL", "HDL", "LDLCALC", "TRIG", "CHOLHDL", "LDLDIRECT" in the last 72 hours. Thyroid Function Tests: No results for input(s): "TSH", "T4TOTAL", "FREET4", "T3FREE", "THYROIDAB" in the last 72 hours. Anemia Panel: No results for input(s): "VITAMINB12", "FOLATE", "FERRITIN", "TIBC", "IRON", "RETICCTPCT" in the last 72 hours. Sepsis Labs: No results for input(s): "PROCALCITON", "LATICACIDVEN" in the last 168 hours.   No results found for this or any previous visit  (from the past 240 hours).        Radiology Studies: No results found.    Scheduled Meds:  arformoterol  15 mcg Nebulization BID   budesonide (PULMICORT) nebulizer solution  0.5 mg Nebulization BID   busPIRone  15 mg Oral TID   Chlorhexidine Gluconate Cloth  6 each Topical Daily   dextromethorphan-guaiFENesin  2 tablet Oral BID   enoxaparin (LOVENOX) injection  90 mg Subcutaneous Q12H   insulin aspart  0-20 Units Subcutaneous TID WC   insulin aspart  0-5 Units Subcutaneous QHS   insulin aspart  4 Units Subcutaneous TID WC   insulin glargine-yfgn  15 Units Subcutaneous Daily   oxyCODONE-acetaminophen  1 tablet Oral Q4H   predniSONE  40 mg Oral Q breakfast   Followed by   Melene Muller ON 08/18/2023] predniSONE  30 mg Oral Q breakfast   Followed by   Melene Muller ON 08/21/2023] predniSONE  20 mg Oral Q breakfast   Followed by   Melene Muller ON 08/24/2023] predniSONE  10 mg Oral Q breakfast   Followed by   Melene Muller ON 08/27/2023] predniSONE  5 mg Oral Q breakfast   pregabalin  75 mg Oral BID   revefenacin  175 mcg Nebulization Daily   tamsulosin  0.8 mg Oral QPM   tiZANidine  6 mg Oral Q4H   venlafaxine XR  150 mg Oral Q breakfast   Warfarin - Pharmacist Dosing Inpatient   Does not apply q1600   zolpidem  5 mg Oral QHS   Continuous Infusions:     LOS: 12 days    Time spent: 39 min Alwyn Ren, MD 08/15/2023, 9:54 AM

## 2023-08-15 NOTE — Progress Notes (Signed)
NAME:  Luis Brown, MRN:  [id number], DOB:  [birth year], LOS: 12 ADMISSION DATE:  08/03/2023, CONSULTATION DATE:  08/07/23 REFERRING MD:  TRH, CHIEF COMPLAINT:  acute on chronic hypoxic resp failure   History of Present Illness:  78 yo male presented a day ago with worsening sob. Pt was found to be worsning hypoxia not tolerant of NRB or NIV so was placed on hhfnc which has been sufficient thus far. However pt's mentation continues to decline, he is oriented only to self at this time. Family is en route. I have attempted to discuss with him code status but his mentation is prohibitive of this.   Pt is currently agitated pulling at lines, pulling at oxygen tubing despite mittens bilaterally. Saying " I just want to get home"  UPDATE:  D/w wife pt would never want cpr or intubation. Pt made DNR/DNI   Pertinent  Medical History  Eosinophilic penumonitis,  ILD Pulm fibrosis Copd -acute exacerbation,  Crhonic hypoxic resp failure Depression Anxiety Hyperlipidemia H/o pe with dvt  Significant Hospital Events: Including procedures, antibiotic start and stop dates in addition to other pertinent events   1/16 Admitted to hospital   1/20 with rapid decline req ccm consultation for resp distress. Added treatment for stenotrophamonas. Made DNR 1/21 better post adding rx for steno  1/22 [facility] wt 55L/min this am 1/23 Unable to sleep last night, remains on 60L [facility]  1/27 down to 3L [place] 1/28 feels much improved  Interim History / Subjective:  Feels a lot better. Did well ambulating with PT yesterday. Eager to get up and walk again today. On 3L O2 still, did require 4L when ambulating.  Objective   Blood pressure (!) 147/72, pulse 81, temperature 97.9 F (36.6 C), temperature source Oral, resp. rate 11, height 5\' 9"  (1.753 m), weight 84.6 kg, SpO2 93%.        Intake/Output Summary (Last 24 hours) at 08/15/2023 1051 Last data filed at 08/15/2023 0500 Gross per 24 hour  Intake --  Output  3550 ml  Net -3550 ml   Filed Weights   08/03/23 1857 08/15/23 0500  Weight: 90 kg 84.6 kg    Examination: General: Adult male, resting in bed about to eat breakfast, in NAD. Neuro: A&O x 3, no deficits. HEENT: [place]/AT. Sclerae anicteric. EOMI. Cardiovascular: RRR, no M/R/G.  Lungs: Respirations even and unlabored.  Faint crackles in bases. Abdomen: BS x 4, soft, NT/ND.  Musculoskeletal: No gross deformities, no edema.  Skin: Intact, warm, no rashes.   Resolved Hospital Problem list   Volume overload Staph pna Acute metabolic encephalopathy  Assessment & Plan:  Acute on chronic hypoxic resp failure ILD/IPF COPD HFpEF RSV pna Stenotrophamonas pna Atrial Fib on AC  Hyperglycemia  Anemia  Pulm/ccm prob list Acute on chronic hypoxic resp failure secondary to RSV viral PNA complicated further by secondary staph and stenotrophamonas PNA superimposed on Pulm fibrosis/ILD and COPD -started on Levaquin 1/20 w/ slow clinical improvement. Since that time systemic steroids, abx, BDs and diuresis have been therapeutic interventions. Completed ancef as well as a 7 day course of Levaquin and Minocycline on 1/27. Plan Continue supplemental O2 as needed to maintain SpO2 > 88% (does NOT need to aim for high 90s-100%) Prednisone 40 mg daily, start taper today until back to baseline 5mg  daily Cont Brovana, Pulmicort, and Yupelri Maintain euvolemia Encourage pulmonary hygiene  Mobilize as able, ongoing PT  Hypotension/circulatory shock - resolved. Of note, is -24.5L net since admit Plan Holding home  anti-hypertensives Holding diuresis given net negative fluid balance since admission Was intermittently on vasopressors, last 1/25 and off since  Hyperglycemia - exacerbated by steroids Plan SSI + Semglee, increase Semglee dose 1/28  Hx A.fib and DVT - on Warfarin PTA Plan Continue Enoxaparin with bridge to PTA Warfarin per pharmacy  Hypocalcemia - corrects to 8.7. - 1g Ca  gluconate.   Rest per primary. PCCM to follow for pulm needs.   Rutherford Guys, PA - [facility] For pager details, please see AMION or use Epic chat  After 1900, please call [facility] for cross coverage needs 08/15/2023, 10:51 AM

## 2023-08-15 NOTE — Plan of Care (Signed)
  Problem: Coping: Goal: Ability to adjust to condition or change in health will improve Outcome: Progressing   Problem: Health Behavior/Discharge Planning: Goal: Ability to manage health-related needs will improve Outcome: Progressing   Problem: Health Behavior/Discharge Planning: Goal: Ability to manage health-related needs will improve Outcome: Progressing   Problem: Clinical Measurements: Goal: Respiratory complications will improve Outcome: Progressing   Problem: Safety: Goal: Ability to remain free from injury will improve Outcome: Progressing

## 2023-08-15 NOTE — Evaluation (Signed)
Occupational Therapy Evaluation Patient Details Name: Luis Brown MRN: [id number] DOB: [birth year] Today's Date: 08/15/2023   History of Present Illness Patient is a 78 year old male who presented with cough, congestion, and worsening shortness of breath.  He was tachycardic, tachypneic, and had leukocytosis on admission.  Positive for RSV.  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 Patient was living at home independently with wife. Patient was CGA for transitioning to EOB and transfer to recliner with max A for LB Dressing/bathing. Patient is motivated to transition home. Patient was noted to have decreased functional activity tolerance, decreased endurance, decreased standing balance, decreased safety awareness, and decreased knowledge of AD/AE impacting participation in ADLs.  Patient would continue to benefit from skilled OT services at this time while admitted and after d/c to address noted deficits in order to improve overall safety and independence in ADLs.        If plan is discharge home, recommend the following: A little help with walking and/or transfers;A little help with bathing/dressing/bathroom;Assistance with cooking/housework;Direct supervision/assist for medications management;Assist for transportation;Help with stairs or ramp for entrance;Direct supervision/assist for financial management    Functional Status Assessment  Patient has had a recent decline in their functional status and demonstrates the ability to make significant improvements in function in a reasonable and predictable amount of time.  Equipment Recommendations  None recommended by OT       Precautions / Restrictions Precautions Precautions: Fall Precaution Comments: chronic 2L O2;  monitor HR Restrictions Weight Bearing Restrictions Per Provider Order: No      Mobility Bed Mobility Overal bed mobility: Needs Assistance Bed Mobility: Supine to Sit     Supine to sit: Supervision     General bed mobility comments: supervision for lines only          Balance Overall balance assessment: Needs assistance         Standing balance support: Bilateral upper extremity supported, Reliant on assistive device for balance, During functional activity Standing balance-Leahy Scale: Poor           ADL either performed or assessed with clinical judgement   ADL Overall ADL's : Needs assistance/impaired Eating/Feeding: Modified independent;Sitting   Grooming: Sitting;Wash/dry face;Set up   Upper Body Bathing: Sitting;Set up Upper Body Bathing Details (indicate cue type and reason): in recliner Lower Body Bathing: Sitting/lateral leans;Moderate assistance   Upper Body Dressing : Set up;Sitting Upper Body Dressing Details (indicate cue type and reason): in recliner Lower Body Dressing: Sitting/lateral leans;Maximal assistance   Toilet Transfer: Minimal assistance;Stand-pivot;Rolling walker (2 wheels) Toilet Transfer Details (indicate cue type and reason): to recliner in room. declined need to use bathroom Toileting- Clothing Manipulation and Hygiene: Minimal assistance;Sit to/from stand Toileting - Clothing Manipulation Details (indicate cue type and reason): bending in half in standing to complete task with patient getting winded quickly.             Vision   Vision Assessment?: No apparent visual deficits            Pertinent Vitals/Pain Pain Assessment Pain Assessment: No/denies pain     Extremity/Trunk Assessment Upper Extremity Assessment Upper Extremity Assessment: Overall WFL for tasks assessed   Lower Extremity Assessment Lower Extremity Assessment: Defer to PT evaluation   Cervical /  Trunk Assessment Cervical / Trunk Assessment:  Normal      Cognition Arousal: Alert Behavior During Therapy: WFL for tasks assessed/performed Overall Cognitive Status: Within Functional Limits for tasks assessed                          Home Living Family/patient expects to be discharged to:: Private residence Living Arrangements: Spouse/significant other Available Help at Discharge: Family;Available 24 hours/day Type of Home: House Home Access: Stairs to enter [employer] of Steps: 2 Entrance Stairs-Rails: Right Home Layout: One level     Bathroom Shower/Tub: Walk-in shower         Home Equipment: Agricultural consultant (2 wheels)          Prior Functioning/Environment Prior Level of Function : Independent/Modified Independent             Mobility Comments: reports independent around home          OT Problem List: Impaired balance (sitting and/or standing);Decreased safety awareness;Decreased knowledge of use of DME or AE;Decreased activity tolerance      OT Treatment/Interventions: Self-care/ADL training;DME and/or AE instruction;Therapeutic activities;Balance training;Therapeutic exercise;Patient/family education    OT Goals(Current goals can be found in the care plan section) Acute Rehab OT Goals Patient Stated Goal: to go home OT Goal Formulation: With patient Time For Goal Achievement: 08/29/23 Potential to Achieve Goals: Fair  OT Frequency: Min 1X/week       AM-PAC OT "6 Clicks" Daily Activity     Outcome Measure Help from another person eating meals?: A Little Help from another person taking care of personal grooming?: A Little Help from another person toileting, which includes using toliet, bedpan, or urinal?: A Lot Help from another person bathing (including washing, rinsing, drying)?: A Lot Help from another person to put on and taking off regular upper body clothing?: A Little Help from another person to put on and taking off regular lower body clothing?: A Lot 6 Click Score:  15   End of Session Equipment Utilized During Treatment: Gait belt;Rolling walker (2 wheels) Nurse Communication: Mobility status  Activity Tolerance: Patient tolerated treatment well Patient left: in chair;with call bell/phone within reach;with family/visitor present  OT Visit Diagnosis: Unsteadiness on feet (R26.81);Other abnormalities of gait and mobility (R26.89)                Time: 1914-7829 OT Time Calculation (min): 35 min Charges:  OT General Charges $OT Visit: 1 Visit OT Evaluation $OT Eval Moderate Complexity: 1 Mod OT Treatments $Self Care/Home Management : 8-22 mins  Rosalio Loud, MS Acute Rehabilitation Department Office# [phone]   Selinda Flavin 08/15/2023, 1:10 PM

## 2023-08-15 NOTE — Progress Notes (Signed)
PHARMACY - ANTICOAGULATION CONSULT NOTE  Pharmacy Consult for Warfarin + enoxaparin bridge Indication: atrial fibrillation and hx DVT  Allergies  Allergen Reactions   Daptomycin     Drug induced pneumonia    Diltiazem Other (See Comments)    Passed out   Ofev [Nintedanib] Diarrhea    fatigue   Pirfenidone Diarrhea    fatigue    Patient Measurements: Height: 5\' 9"  (175.3 cm) Weight: 84.6 kg (186 lb 8.2 oz) IBW/kg (Calculated) : 70.7  Vital Signs: Temp: 97.9 F (36.6 C) (01/28 0800) Temp Source: Oral (01/28 0800) BP: 147/72 (01/28 0800) Pulse Rate: 81 (01/28 0800)  Labs: Recent Labs    08/13/23 0245 08/14/23 0246 08/15/23 0309  HGB  --  11.2* 10.5*  HCT  --  35.5* 33.3*  PLT  --  329 342  LABPROT 17.2* 17.7* 19.9*  INR 1.4* 1.4* 1.7*  CREATININE  --   --  0.43*    Estimated Creatinine Clearance: 74.9 mL/min (A) (by C-G formula based on SCr of 0.43 mg/dL (L)).   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   Esophageal reflux 02/10/2015   Fatigue    HTN (hypertension)    Hyperglycemia    preDM    Hyperlipidemia    Insomnia    Low back pain radiating to both legs 01/16/2017   Measles as a child   Overweight(278.02)    Peripheral neuropathy 08/07/2019   Personal history of colonic polyps 10/27/2012   Follows with [facility]   Pneumonia    " walking"   Preventative health care    Testosterone deficiency 05/23/2012   Wears glasses     Medications:  Scheduled:   arformoterol  15 mcg  Nebulization BID   budesonide (PULMICORT) nebulizer solution  0.5 mg Nebulization BID   busPIRone  15 mg Oral TID   Chlorhexidine Gluconate Cloth  6 each Topical Daily   dextromethorphan-guaiFENesin  2 tablet Oral BID   enoxaparin (LOVENOX) injection  90 mg Subcutaneous Q12H   insulin aspart  0-20 Units Subcutaneous TID WC   insulin aspart  0-5 Units Subcutaneous QHS   insulin aspart  4 Units Subcutaneous TID WC   insulin glargine-yfgn  15 Units Subcutaneous Daily   oxyCODONE-acetaminophen  1 tablet Oral Q4H   predniSONE  40 mg Oral Q breakfast   Followed by   Melene Muller ON 08/18/2023] predniSONE  30 mg Oral Q breakfast   Followed by   Melene Muller ON 08/21/2023] predniSONE  20 mg Oral Q breakfast   Followed by   Melene Muller ON 08/24/2023] predniSONE  10 mg Oral Q breakfast   Followed by   Melene Muller ON 08/27/2023] predniSONE  5 mg Oral Q breakfast   pregabalin  75 mg Oral BID   revefenacin  175 mcg Nebulization Daily   tamsulosin  0.8 mg Oral QPM   tiZANidine  6 mg Oral Q4H   venlafaxine XR  150 mg Oral Q breakfast   Warfarin - Pharmacist  Dosing Inpatient   Does not apply q1600   zolpidem  5 mg Oral QHS     Assessment: Patient is a 68 yoM admitted on 1/16 with acute on chronic respiratory failure. Pt found to have RSV as well as PNA (MSSA, stenotrophomonas) completing 7 days of antibiotics on 08/13/22.   PMH significant for atrial fibrillation, hx of PE/DVT anticoagulated PTA with warfarin. Warfarin was held while pt unable to take PO medications, resumed on 1/23. Pharmacy consulted to dose warfarin, with enoxaparin bridge while INR subtherapeutic.   Home warfarin dose: 5mg  daily except 7.5 mg on Tuesdays and Saturdays.  -INR on admission: 3.3  Anticoagulation During Admission: -Warfarin held: 1/16 & 1/17, 1/20 > 1/22 -Enoxaparin 1/20 >>  Today, 08/15/23: INR = 1.7 is subtherapeutic, but increased  CBC: Stable, SCr low Drug interactions:  Levofloxacin course completed 1/26, minocycline completed  1/27.  No bleeding reported  Goal of Therapy:  INR 2-3 Monitor platelets by anticoagulation protocol: Yes   Plan:  Continue enoxaparin 90 mg (1 mg/kg) subQ q12h. Continue until INR >/= 2 Warfarin 5 mg PO today INR daily Monitor CBC  Cindi Carbon, PharmD 08/15/23 10:54 AM

## 2023-08-16 ENCOUNTER — Telehealth: Payer: Self-pay | Admitting: Physician Assistant

## 2023-08-16 DIAGNOSIS — J158 Pneumonia due to other specified bacteria: Secondary | ICD-10-CM

## 2023-08-16 DIAGNOSIS — J9621 Acute and chronic respiratory failure with hypoxia: Secondary | ICD-10-CM | POA: Diagnosis not present

## 2023-08-16 DIAGNOSIS — J189 Pneumonia, unspecified organism: Secondary | ICD-10-CM | POA: Diagnosis not present

## 2023-08-16 LAB — GLUCOSE, CAPILLARY
Glucose-Capillary: 154 mg/dL — ABNORMAL HIGH (ref 70–99)
Glucose-Capillary: 265 mg/dL — ABNORMAL HIGH (ref 70–99)
Glucose-Capillary: 230 mg/dL — ABNORMAL HIGH (ref 70–99)
Glucose-Capillary: 227 mg/dL — ABNORMAL HIGH (ref 70–99)

## 2023-08-16 LAB — PROTIME-INR
INR: 1.9 — ABNORMAL HIGH (ref 0.8–1.2)
Prothrombin Time: 21.7 s — ABNORMAL HIGH (ref 11.4–15.2)

## 2023-08-16 MED ORDER — METOPROLOL SUCCINATE ER 25 MG PO TB24
12.5000 mg | ORAL_TABLET | Freq: Every day | ORAL | Status: DC
  Administered 2023-08-16 – 2023-08-20 (×5): 12.5 mg via ORAL
  Filled 2023-08-16 (×5): qty 1

## 2023-08-16 MED ORDER — ENOXAPARIN SODIUM 80 MG/0.8ML IJ SOSY: 80.0000 mg | PREFILLED_SYRINGE | Freq: Two times a day (BID) | INTRAMUSCULAR | Status: DC

## 2023-08-16 MED ORDER — OXYCODONE HCL 5 MG PO TABS
5.0000 mg | ORAL_TABLET | ORAL | Status: DC
  Administered 2023-08-16 – 2023-08-20 (×23): 5 mg via ORAL
  Filled 2023-08-16 (×24): qty 1

## 2023-08-16 MED ORDER — WARFARIN SODIUM 5 MG PO TABS
5.0000 mg | ORAL_TABLET | Freq: Once | ORAL | Status: AC
  Administered 2023-08-16: 5 mg via ORAL
  Filled 2023-08-16: qty 1

## 2023-08-16 MED ORDER — ACETAMINOPHEN 325 MG PO TABS
650.0000 mg | ORAL_TABLET | ORAL | Status: AC
  Administered 2023-08-16 – 2023-08-18 (×12): 650 mg via ORAL
  Filled 2023-08-16 (×12): qty 2

## 2023-08-16 NOTE — Progress Notes (Signed)
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  He was diagnosed with acute on chronic hypoxic respiratory failure due to RSV, and pneumonia superimposed on interstitial lung disease and COPD.  He had severe hyponatremia/SIADH noted on admission.  Patient reported to have been independent at baseline.  Patient was seen by PT and OT and found to have functional deficits.  Per chart review family can assist 24 hours a day after discharge.  I think he would be a good candidate for CIR when he is medically stable.

## 2023-08-16 NOTE — Telephone Encounter (Signed)
PT asked to cancel. He is in Hos[facility]. States we are taking care of him there. NFN

## 2023-08-16 NOTE — Progress Notes (Signed)
NAME:  Luis Brown, MRN:  [id number], DOB:  12/19/1945, LOS: 13 ADMISSION DATE:  08/03/2023, CONSULTATION DATE:  08/07/23 REFERRING MD:  TRH, CHIEF COMPLAINT:  acute on chronic hypoxic resp failure   History of Present Illness:  78 yo male presented a day ago with worsening sob. Pt was found to be worsning hypoxia not tolerant of NRB or NIV so was placed on hhfnc which has been sufficient thus far. However pt's mentation continues to decline, he is oriented only to self at this time. Family is en route. I have attempted to discuss with him code status but his mentation is prohibitive of this.   Pt is currently agitated pulling at lines, pulling at oxygen tubing despite mittens bilaterally. Saying " I just want to get home"  UPDATE:  D/w wife pt would never want cpr or intubation. Pt made DNR/DNI   Pertinent  Medical History  Eosinophilic penumonitis,  ILD Pulm fibrosis Copd -acute exacerbation,  Crhonic hypoxic resp failure Depression Anxiety Hyperlipidemia H/o pe with dvt  Significant Hospital Events: Including procedures, antibiotic start and stop dates in addition to other pertinent events   1/16 Admitted to hospital   1/20 with rapid decline req ccm consultation for resp distress. Added treatment for stenotrophamonas. Made DNR 1/21 better post adding rx for steno  1/22 HHFNC wt 55L/min this am 1/23 Unable to sleep last night, remains on 60L HHFNC  1/27 down to 3L [place] 1/28 feels much improved. CIR consult recommended by PT  Interim History / Subjective:  Continues to improve. Down to 2L via [place] this AM. Feels good. PT recommending CIR consult.  Objective   Blood pressure (!) 127/50, pulse 68, temperature 97.7 F (36.5 C), temperature source Oral, resp. rate 20, height 5\' 9"  (1.753 m), weight 85.3 kg, SpO2 100%.    FiO2 (%):  [21 %] 21 %   Intake/Output Summary (Last 24 hours) at 08/16/2023 1136 Last data filed at 08/16/2023 0545 Gross per 24 hour  Intake 751.72 ml   Output 2865 ml  Net -2113.28 ml   Filed Weights   08/03/23 1857 08/15/23 0500 08/16/23 0500  Weight: 90 kg 84.6 kg 85.3 kg    Examination: General: Adult male, resting in bed watching TV, in NAD. Neuro: A&O x 3, no deficits. HEENT: [place]/AT. Sclerae anicteric. EOMI. Cardiovascular: RRR, no M/R/G.  Lungs: Respirations even and unlabored.  Faint crackles in bases. Abdomen: BS x 4, soft, NT/ND.  Musculoskeletal: No gross deformities, no edema.  Skin: Intact, warm, no rashes.   Resolved Hospital Problem list   Volume overload Staph pna Acute metabolic encephalopathy  Assessment & Plan:  Acute on chronic hypoxic resp failure ILD/IPF COPD HFpEF RSV pna Stenotrophamonas pna Atrial Fib on AC  Hyperglycemia  Anemia  Pulm/ccm prob list Acute on chronic hypoxic resp failure secondary to RSV viral PNA complicated further by secondary staph and stenotrophamonas PNA superimposed on Pulm fibrosis/ILD and COPD -started on Levaquin 1/20 w/ slow clinical improvement. Since that time systemic steroids, abx, BDs and diuresis have been therapeutic interventions. Completed ancef as well as a 7 day course of Levaquin and Minocycline on 1/27. Plan Continue supplemental O2 as needed to maintain SpO2 > 88% (does NOT need to aim for high 90s-100%). Almost off O2 all together Continue prednisone taper until back to baseline 5mg  daily Cont Brovana, Pulmicort, and Yupelri Maintain euvolemia Encourage pulmonary hygiene  Mobilize as able, ongoing PT CIR consult in process F/u with pulmonary as outpatient, will send a  message to our office to arrange for this  Hypotension/circulatory shock - resolved. Of note, is -26.5L net since admit Plan Per primary  Hyperglycemia - exacerbated by steroids Plan SSI + Semglee, increase Semglee dose 1/28  Hx A.fib and DVT - on Warfarin PTA Plan Per primary - continue Warfarin per pharmacy, completed Lovenox bridge   Rest per primary. Nothing further to  add.  PCCM will sign off.  Please call us back if we can be of any further assistance. Outpatient follow up to be arranged by our office (message sent).    Rutherford Guys, PA - [facility] For pager details, please see AMION or use Epic chat  After 1900, please call [facility] for cross coverage needs 08/16/2023, 11:36 AM

## 2023-08-16 NOTE — Progress Notes (Signed)
PHARMACY - ANTICOAGULATION CONSULT NOTE  Pharmacy Consult for Warfarin + enoxaparin bridge Indication: atrial fibrillation and hx DVT  Allergies  Allergen Reactions   Daptomycin     Drug induced pneumonia    Diltiazem Other (See Comments)    Passed out   Ofev [Nintedanib] Diarrhea    fatigue   Pirfenidone Diarrhea    fatigue    Patient Measurements: Height: 5\' 9"  (175.3 cm) Weight: 85.3 kg (188 lb 0.8 oz) IBW/kg (Calculated) : 70.7  Vital Signs: Temp: 98 F (36.7 C) (01/29 0437) Temp Source: Oral (01/29 0437) BP: 162/115 (01/29 0800) Pulse Rate: 103 (01/29 0800)  Labs: Recent Labs    08/14/23 0246 08/15/23 0309 08/16/23 0259  HGB 11.2* 10.5*  --   HCT 35.5* 33.3*  --   PLT 329 342  --   LABPROT 17.7* 19.9* 21.7*  INR 1.4* 1.7* 1.9*  CREATININE  --  0.43*  --     Estimated Creatinine Clearance: 81 mL/min (A) (by C-G formula based on SCr of 0.43 mg/dL (L)).   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   Esophageal reflux 02/10/2015   Fatigue    HTN (hypertension)    Hyperglycemia    preDM    Hyperlipidemia    Insomnia    Low back pain radiating to both legs 01/16/2017   Measles as a child   Overweight(278.02)    Peripheral neuropathy 08/07/2019   Personal history of colonic polyps 10/27/2012   Follows with [facility]   Pneumonia    " walking"   Preventative health care    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   Wears glasses     Medications:  Scheduled:   arformoterol  15 mcg  Nebulization BID   budesonide (PULMICORT) nebulizer solution  0.5 mg Nebulization BID   busPIRone  15 mg Oral TID   Chlorhexidine Gluconate Cloth  6 each Topical Daily   dextromethorphan-guaiFENesin  2 tablet Oral BID   enoxaparin (LOVENOX) injection  80 mg Subcutaneous Q12H   insulin aspart  0-20 Units Subcutaneous TID WC   insulin aspart  0-5 Units Subcutaneous QHS   insulin aspart  4 Units Subcutaneous TID WC   insulin glargine-yfgn  18 Units Subcutaneous Daily   oxyCODONE-acetaminophen  1 tablet Oral Q4H   predniSONE  40 mg Oral Q breakfast   Followed by   Melene Muller ON 08/18/2023] predniSONE  30 mg Oral Q breakfast   Followed by   Melene Muller ON 08/21/2023] predniSONE  20 mg Oral Q breakfast   Followed by   Melene Muller ON 08/24/2023] predniSONE  10 mg Oral Q breakfast   Followed by   Melene Muller ON 08/27/2023] predniSONE  5 mg Oral Q breakfast   pregabalin  75 mg Oral BID   revefenacin  175 mcg Nebulization Daily   tamsulosin  0.8 mg Oral QPM   tiZANidine  6 mg Oral Q4H   venlafaxine XR  150 mg Oral Q breakfast   Warfarin - Pharmacist  Dosing Inpatient   Does not apply q1600   zolpidem  5 mg Oral QHS     Assessment: Patient is a 64 yoM admitted on 1/16 with acute on chronic respiratory failure. Pt found to have RSV as well as PNA (MSSA, stenotrophomonas) completed 7 days of antibiotics on 08/13/22.   PMH significant for atrial fibrillation, hx of PE/DVT anticoagulated PTA with warfarin. Warfarin was held while pt unable to take PO medications, resumed on 1/23. Pharmacy consulted to dose warfarin, with enoxaparin bridge while INR subtherapeutic.   Home warfarin dose: 5mg  daily except 7.5 mg on Tuesdays and Saturdays.  -INR on admission: 3.3  Anticoagulation During Admission: -Warfarin held: 1/16 & 1/17, 1/20 > 1/22 -Enoxaparin 1/20 >>  Today, 08/16/23: INR = 1.9 is slightly subtherapeutic, but increasing CBC: Stable, SCr low Drug interactions: None major. Antibiotics completed 1/27 No bleeding  reported Weight now 85 kg after diuresis  Goal of Therapy:  INR 2-3 Monitor platelets by anticoagulation protocol: Yes   Plan:  Reduce enoxaparin to 80 mg (1 mg/kg) subQ q12h. Continue until INR >/= 2 Warfarin 5 mg PO today INR daily, CBC with AM labs tomorrow  At this time, would recommend resuming patient's home dose at discharge.   Cindi Carbon, PharmD 08/16/23 8:22 AM

## 2023-08-16 NOTE — Progress Notes (Signed)
Inpatient Rehabilitation Admissions Coordinator   I began [facility] Medicare Auth today for possible CIR admit. I await approval which typically takes 48 to 72 hrs from this payor.  Ottie Glazier, RN, MSN Rehab Admissions Coordinator [phone] 08/16/2023 5:15 PM

## 2023-08-16 NOTE — Telephone Encounter (Signed)
Sir See last encounter. Here is more details:  I spoke to PT from his hospital bed. Pt was pretty adamant about not sched a HFU. I asked him if he'd be out by 2/3, he said no and added "They really don't know what they are talking about then because I've had 4 Dr's in here today from Pulmonary." he added they have no idea when he will be out. Please advise if I can be of further service on this. Thank you.

## 2023-08-16 NOTE — Inpatient Diabetes Management (Signed)
Inpatient Diabetes Program Recommendations  AACE/ADA: New Consensus Statement on Inpatient Glycemic Control (2015)  Target Ranges:  Prepandial:   less than 140 mg/dL      Peak postprandial:   less than 180 mg/dL (1-2 hours)      Critically ill patients:  140 - 180 mg/dL   Lab Results  Component Value Date   GLUCAP 265 (H) 08/16/2023   HGBA1C 8.8 (H) 05/19/2023    Review of Glycemic Control  Latest Reference Range & Units 08/15/23 07:33 08/15/23 12:50 08/15/23 15:32 08/15/23 17:18 08/15/23 21:32 08/16/23 07:42 08/16/23 12:02  Glucose-Capillary 70 - 99 mg/dL 409 (H) 811 (H) 914 (H) 241 (H) 156 (H) 154 (H) 265 (H)  (H): Data is abnormally high  Diabetes history: DM Outpatient Diabetes medications: Lantus 26 units every day, Humalog 3 units TID, Januvia 50 mg every day, Mounjaro 2.5 mg weekly Current orders for Inpatient glycemic control: Semglee 18 units every day, Novolog 0-20 units TID and 0-5 units at bedtime, Novolog 4 units TID, Prednisone taper  Inpatient Diabetes Program Recommendations:    Please consider increasing meal coverage:  Novolog 6 units TID if he consumes at least 50%.  Will continue to follow while inpatient.  Thank you, Dulce Sellar, MSN, CDCES Diabetes Coordinator Inpatient Diabetes Program [phone] (team pager from 8a-5p)

## 2023-08-16 NOTE — Progress Notes (Signed)
TRH ROUNDING   NOTE Luis Brown [id number]  DOB: 10/23/1945  DOA: 08/03/2023  PCP: Bradd Canary, MD  08/16/2023,9:00 AM   LOS: 13 days      Code Status: DNR From: Home  current Dispo: Possible CIR   78 year old white male Known COPD/ILD pulmonary fibrosis chronic respiratory failure 2 L at baseline 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 previously on Dalvance-had aspiration in 11/2020 on that admission 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 on that admission-currently on Coumadin Polymyalgia rheumatica Anxiety/depression  Events 1/15 brought to hospital with cough congestion shortness of breath---also severe hyponatremia and hyperglycemia 300-sodium 121 BUN/creatinine 17/0.5 WBC 14 platelet 345 TSH 19 RSV was positive Urine sodium was 18 urine awesome was 601  - procedures 1/16 CXR = chronic coarsened interstitial markings bilaterally no superb and post acute process    Plan  Sepsis secondary to RSV, MSSA and stenotrophomonas pneumonia superimposed on interstitial lung disease Completed >10 days broad-spectrum antibiotics 1/27 no recurrent fevers chills white count is down Do not think patient needs to be on droplet precautions any longer Outpatient follow-up with pulmonology--- is on a prolonged taper of prednisone back to baseline prednisone 5 mg at discharge Continues on albuterol every 4 as needed Brovana 15 twice daily Pulmicort 0.5 twice daily as well as Mucinex as needed and Yupelri 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 Start Toprol-XL 12.5 Continues on Coumadin as below Previous back surgery, back infections Resumed on Lyrica 75 twice daily, Zanaflex 6 mg every 4 hours Because pain is still 7/10 I will split  Percocet to Oxy IR 5 every 4 and start Tylenol 650 Q4 additionally Severe hyponatremia/SIADH on admission Completley resolved at this time May continue regular diet HFpEF-EF 60-65% grade 1 DD no valvular abnormality See above regarding metoprolol-does not appear volume overloaded BPH Continues on Flomax 0.8 nightly-outpatient follow-up Will remove Foley today and ambulate Depression Continue BuSpar 15 3 times daily, Effexor 1:50 AM, 05 at bedtime for sleep DM TY 2 CBGs improved over the last day to day and a half eating 100% of meals on short acting, continue long-acting 18 units 40 units 3 times daily meals and sliding scale Expect that sugars will improve with de-escalation of steroids   DVT prophylaxis: On Coumadin  Status is: Inpatient Remains inpatient appropriate because: Requires in hospital management but likely can go to rehab whenever able   Subjective: States back pain is about 7 and 10 as has not been getting up as best possible No chest pain no fever No nausea no vomiting Overall breathing well does not even seem to be on oxygen now  Objective + exam Vitals:   08/16/23 0500 08/16/23 0700 08/16/23 0742 08/16/23 0800  BP:  (!) 141/51  (!) 162/115  Pulse:  63  (!) 103  Resp:  (!) 34  (!) 21  Temp:   97.7 F (36.5 C)   TempSrc:   Oral   SpO2:  100%  95%  Weight: 85.3 kg     Height:       Filed Weights   08/03/23 1857 08/15/23 0500 08/16/23 0500  Weight: 90 kg 84.6 kg 85.3 kg    Examination: EOMI NCAT no focal deficit No icterus no pallor Some crackles posterolaterally no wheeze S1-S2 no murmur  Abdomen soft No lower extremity edema Power 5/5  Data Reviewed: reviewed   CBC    Component Value Date/Time   WBC 7.3 08/15/2023 0309   RBC 3.68 (L) 08/15/2023 0309   HGB 10.5 (L) 08/15/2023 0309   HGB 11.9 (L) 10/05/2021 1429   HGB 12.8 (L) 11/18/2020 1608   HCT 33.3 (L) 08/15/2023 0309   PLT 342 08/15/2023 0309   PLT 322 10/05/2021 1429   MCV 90.5  08/15/2023 0309   MCH 28.5 08/15/2023 0309   MCHC 31.5 08/15/2023 0309   RDW 14.7 08/15/2023 0309   LYMPHSABS 0.2 (L) 08/09/2023 0316   MONOABS 0.9 08/09/2023 0316   EOSABS 0.0 08/09/2023 0316   BASOSABS 0.1 08/09/2023 0316      Latest Ref Rng & Units 08/15/2023    3:09 AM 08/11/2023    3:19 AM 08/10/2023    3:08 AM  CMP  Glucose 70 - 99 mg/dL 161  096  045   BUN 8 - 23 mg/dL 16  24  21    Creatinine 0.61 - 1.24 mg/dL 4.09  8.11  9.14   Sodium 135 - 145 mmol/L 132  134  136   Potassium 3.5 - 5.1 mmol/L 3.7  3.9  3.3   Chloride 98 - 111 mmol/L 96  97  96   CO2 22 - 32 mmol/L 29  28  29    Calcium 8.9 - 10.3 mg/dL 7.7  8.0  8.3   Total Protein 6.5 - 8.1 g/dL 4.8     Total Bilirubin 0.0 - 1.2 mg/dL 0.6     Alkaline Phos 38 - 126 U/L 53     AST 15 - 41 U/L 14     ALT 0 - 44 U/L 10       Scheduled Meds:  arformoterol  15 mcg Nebulization BID   budesonide (PULMICORT) nebulizer solution  0.5 mg Nebulization BID   busPIRone  15 mg Oral TID   Chlorhexidine Gluconate Cloth  6 each Topical Daily   dextromethorphan-guaiFENesin  2 tablet Oral BID   enoxaparin (LOVENOX) injection  80 mg Subcutaneous Q12H   insulin aspart  0-20 Units Subcutaneous TID WC   insulin aspart  0-5 Units Subcutaneous QHS   insulin aspart  4 Units Subcutaneous TID WC   insulin glargine-yfgn  18 Units Subcutaneous Daily   oxyCODONE-acetaminophen  1 tablet Oral Q4H   predniSONE  40 mg Oral Q breakfast   Followed by   Melene Muller ON 08/18/2023] predniSONE  30 mg Oral Q breakfast   Followed by   Melene Muller ON 08/21/2023] predniSONE  20 mg Oral Q breakfast   Followed by   Melene Muller ON 08/24/2023] predniSONE  10 mg Oral Q breakfast   Followed by   Melene Muller ON 08/27/2023] predniSONE  5 mg Oral Q breakfast   pregabalin  75 mg Oral BID   revefenacin  175 mcg Nebulization Daily   tamsulosin  0.8 mg Oral QPM   tiZANidine  6 mg Oral Q4H   venlafaxine XR  150 mg Oral Q breakfast   warfarin  5 mg Oral ONCE-1600   Warfarin - Pharmacist  Dosing Inpatient   Does not apply q1600   zolpidem  5 mg Oral QHS   Continuous Infusions:  Time  33  Rhetta Mura, MD  [facility]

## 2023-08-16 NOTE — Plan of Care (Signed)
  Problem: Coping: Goal: Ability to adjust to condition or change in health will improve Outcome: Progressing   Problem: Fluid Volume: Goal: Ability to maintain a balanced intake and output will improve Outcome: Progressing   Problem: Metabolic: Goal: Ability to maintain appropriate glucose levels will improve Outcome: Progressing   Problem: Nutritional: Goal: Maintenance of adequate nutrition will improve Outcome: Progressing   Problem: Safety: Goal: Ability to remain free from injury will improve Outcome: Progressing

## 2023-08-16 NOTE — Progress Notes (Signed)
Physical Therapy Treatment Patient Details Name: Luis Brown MRN: [id number] DOB: [birth year] Today's Date: 08/16/2023   History of Present Illness Patient is a 78 year old male who presented with cough, congestion, and worsening shortness of breath.  He was tachycardic, tachypneic, and had leukocytosis on admission.  Positive for RSV.  Dx: Acute on chronic hypoxic resp failure  secondary to RSV viral PNA complicated further by secondary staph and stenotrophamonas PNA superimposed on Pulm fibrosis/ILD and COPD.  PMH eosinophilic pneumonitis, ILD, pulmonary fibrosis, COPD, chronic hypoxia on 2 L O2 at home, PAF, history of PE and DVT on Coumadin, GERD, malnutrition, HLD, anxiety/depression, PMR    PT Comments   Pt admitted with above diagnosis.  Pt currently with functional limitations due to the deficits listed below (see PT Problem List).  Pt seated in recliner when PT arrived. Spouse present. Pt indicated that he was started on a new medication to address elevated HR and <125 with mobility today. Pt eager to participate with therapy. Pt required min cues and CGA for sit to stand from recliner, CGA for gait tasks 60 feet with RW, one standing rest break and on 2 -3 L/min with pt indicating fatigue and desaturating with cues for coordinated breathing strategies. Pt elected to return to the recliner, pt placed on 2 L/min and O2 saturation 95-97%, pt demonstrating frequent but unproductive cough, spouse present and all needs in place.   Pt will benefit from acute skilled PT to increase their independence and safety with mobility to allow discharge.      If plan is discharge home, recommend the following: A little help with walking and/or transfers;A little help with bathing/dressing/bathroom;Help with stairs or ramp for entrance;Assistance with cooking/housework   Can travel by private vehicle        Equipment Recommendations  None recommended by PT    Recommendations for Other Services Rehab  consult     Precautions / Restrictions Precautions Precautions: Fall Precaution Comments: chronic 2L O2; monitor HR Restrictions Weight Bearing Restrictions Per Provider Order: No     Mobility  Bed Mobility               General bed mobility comments: pt seated in recliner when PT arrived and returned to recliner    Transfers Overall transfer level: Needs assistance Equipment used: Rolling walker (2 wheels) Transfers: Sit to/from Stand Sit to Stand: Contact guard assist           General transfer comment: pt able to push to stand from recliner with 1 UE, able to power up and minimal cues    Ambulation/Gait Ambulation/Gait assistance: Contact guard assist (wife assisted with mobile O2 today) Gait Distance (Feet): 60 Feet Assistive device: Rolling walker (2 wheels) Gait Pattern/deviations: Step-to pattern Gait velocity: decreased 2 standing theraputic rest breaks     General Gait Details: medication changes to address elevated HR and remained < 125 with gait tasks today, o2 initally on 2 L/min and pt desaturated to 84% with exertion and unable to recover in 54s with statnding theraputic rest break, cues for pursed lip breathing and slowing RR  and O2 increased to 3 L/min and recovered in static standing to 94% pt able to complete amb bout and desaturated on additional time to 86% and able to recover immediatly once seated in recliner. pt indicated B LE fatigue with prolonged gait tasks today.   Stairs             Psychologist, prison and probation services  Tilt Bed    Modified Rankin (Stroke Patients Only)       Balance Overall balance assessment: Needs assistance Sitting-balance support: Feet supported Sitting balance-Leahy Scale: Good     Standing balance support: Reliant on assistive device for balance, During functional activity, Single extremity supported Standing balance-Leahy Scale: Poor Standing balance comment: static standing with 1 UE support at RW with  CGA                            Cognition Arousal: Alert Behavior During Therapy: WFL for tasks assessed/performed Overall Cognitive Status: Within Functional Limits for tasks assessed                                          Exercises      General Comments        Pertinent Vitals/Pain Pain Assessment Pain Assessment: No/denies pain    Home Living                          Prior Function            PT Goals (current goals can now be found in the care plan section) Acute Rehab PT Goals PT Goal Formulation: With patient Time For Goal Achievement: 08/25/23 Potential to Achieve Goals: Good Progress towards PT goals: Progressing toward goals    Frequency    Min 1X/week      PT Plan      Co-evaluation              AM-PAC PT "6 Clicks" Mobility   Outcome Measure  Help needed turning from your back to your side while in a flat bed without using bedrails?: A Little Help needed moving from lying on your back to sitting on the side of a flat bed without using bedrails?: A Little Help needed moving to and from a bed to a chair (including a wheelchair)?: A Little Help needed standing up from a chair using your arms (e.g., wheelchair or bedside chair)?: A Little Help needed to walk in hospital room?: A Little Help needed climbing 3-5 steps with a railing? : A Lot 6 Click Score: 17    End of Session Equipment Utilized During Treatment: Gait belt;Oxygen Activity Tolerance: Patient limited by fatigue;Treatment limited secondary to medical complications (Comment) (SOB) Patient left: in chair;with call bell/phone within reach;with family/visitor present Nurse Communication: Mobility status PT Visit Diagnosis: Difficulty in walking, not elsewhere classified (R26.2)     Time: 4098-1191 PT Time Calculation (min) (ACUTE ONLY): 28 min  Charges:    $Gait Training: 8-22 mins $Therapeutic Activity: 8-22 mins PT General  Charges $$ ACUTE PT VISIT: 1 Visit                     Luis Brown, PT Acute Rehab    Luis Brown 08/16/2023, 5:18 PM

## 2023-08-16 NOTE — Plan of Care (Signed)
  Problem: Coping: Goal: Ability to adjust to condition or change in health will improve Outcome: Progressing   Problem: Health Behavior/Discharge Planning: Goal: Ability to identify and utilize available resources and services will improve Outcome: Progressing   Problem: Metabolic: Goal: Ability to maintain appropriate glucose levels will improve Outcome: Progressing   Problem: Activity: Goal: Risk for activity intolerance will decrease Outcome: Progressing   Problem: Coping: Goal: Level of anxiety will decrease Outcome: Progressing   Problem: Pain Managment: Goal: General experience of comfort will improve and/or be controlled Outcome: Progressing   Problem: Skin Integrity: Goal: Risk for impaired skin integrity will decrease Outcome: Progressing   Problem: Activity: Goal: Ability to tolerate increased activity will improve Outcome: Progressing

## 2023-08-17 DIAGNOSIS — L8990 Pressure ulcer of unspecified site, unspecified stage: Secondary | ICD-10-CM | POA: Insufficient documentation

## 2023-08-17 DIAGNOSIS — J189 Pneumonia, unspecified organism: Secondary | ICD-10-CM | POA: Diagnosis not present

## 2023-08-17 LAB — CBC WITH DIFFERENTIAL/PLATELET
Abs Immature Granulocytes: 0.05 10*3/uL (ref 0.00–0.07)
Basophils Absolute: 0 10*3/uL (ref 0.0–0.1)
Basophils Relative: 0 %
Eosinophils Absolute: 0 10*3/uL (ref 0.0–0.5)
Eosinophils Relative: 1 %
HCT: 33.6 % — ABNORMAL LOW (ref 39.0–52.0)
Hemoglobin: 10.7 g/dL — ABNORMAL LOW (ref 13.0–17.0)
Immature Granulocytes: 1 %
Lymphocytes Relative: 4 %
Lymphs Abs: 0.2 10*3/uL — ABNORMAL LOW (ref 0.7–4.0)
MCH: 29.1 pg (ref 26.0–34.0)
MCHC: 31.8 g/dL (ref 30.0–36.0)
MCV: 91.3 fL (ref 80.0–100.0)
Monocytes Absolute: 0.4 10*3/uL (ref 0.1–1.0)
Monocytes Relative: 6 %
Neutro Abs: 5.9 10*3/uL (ref 1.7–7.7)
Neutrophils Relative %: 88 %
Platelets: 400 10*3/uL (ref 150–400)
RBC: 3.68 MIL/uL — ABNORMAL LOW (ref 4.22–5.81)
RDW: 14.9 % (ref 11.5–15.5)
WBC: 6.6 10*3/uL (ref 4.0–10.5)
nRBC: 0 % (ref 0.0–0.2)

## 2023-08-17 LAB — BASIC METABOLIC PANEL
Anion gap: 7 (ref 5–15)
BUN: 14 mg/dL (ref 8–23)
CO2: 26 mmol/L (ref 22–32)
Calcium: 8.1 mg/dL — ABNORMAL LOW (ref 8.9–10.3)
Chloride: 98 mmol/L (ref 98–111)
Creatinine, Ser: 0.61 mg/dL (ref 0.61–1.24)
GFR, Estimated: >60 mL/min (ref >60)
Glucose, Bld: 178 mg/dL — ABNORMAL HIGH (ref 70–99)
Potassium: 3.5 mmol/L (ref 3.5–5.1)
Sodium: 131 mmol/L — ABNORMAL LOW (ref 135–145)

## 2023-08-17 LAB — GLUCOSE, CAPILLARY
Glucose-Capillary: 161 mg/dL — ABNORMAL HIGH (ref 70–99)
Glucose-Capillary: 303 mg/dL — ABNORMAL HIGH (ref 70–99)
Glucose-Capillary: 207 mg/dL — ABNORMAL HIGH (ref 70–99)
Glucose-Capillary: 243 mg/dL — ABNORMAL HIGH (ref 70–99)

## 2023-08-17 LAB — PROTIME-INR
INR: 2 — ABNORMAL HIGH (ref 0.8–1.2)
Prothrombin Time: 22.7 s — ABNORMAL HIGH (ref 11.4–15.2)

## 2023-08-17 MED ORDER — WARFARIN SODIUM 5 MG PO TABS
5.0000 mg | ORAL_TABLET | Freq: Once | ORAL | Status: AC
  Administered 2023-08-17: 5 mg via ORAL
  Filled 2023-08-17: qty 1

## 2023-08-17 NOTE — Progress Notes (Signed)
PHARMACY - ANTICOAGULATION CONSULT NOTE  Pharmacy Consult for Warfarin Indication: atrial fibrillation and hx DVT  Allergies  Allergen Reactions   Daptomycin     Drug induced pneumonia    Diltiazem Other (See Comments)    Passed out   Ofev [Nintedanib] Diarrhea    fatigue   Pirfenidone Diarrhea    fatigue    Patient Measurements: Height: 5\' 9"  (175.3 cm) Weight: 85.3 kg (188 lb 0.8 oz) IBW/kg (Calculated) : 70.7  Vital Signs: Temp: 98.7 F (37.1 C) (01/30 0400) Temp Source: Axillary (01/30 0400) BP: 110/48 (01/30 0500) Pulse Rate: 74 (01/30 0500)  Labs: Recent Labs    08/15/23 0309 08/16/23 0259 08/17/23 0328  HGB 10.5*  --  10.7*  HCT 33.3*  --  33.6*  PLT 342  --  400  LABPROT 19.9* 21.7* 22.7*  INR 1.7* 1.9* 2.0*  CREATININE 0.43*  --  0.61    Estimated Creatinine Clearance: 81 mL/min (by C-G formula based on SCr of 0.61 mg/dL).   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   Esophageal reflux 02/10/2015   Fatigue    HTN (hypertension)    Hyperglycemia    preDM    Hyperlipidemia    Insomnia    Low back pain radiating to both legs 01/16/2017   Measles as a child   Overweight(278.02)    Peripheral neuropathy 08/07/2019   Personal history of colonic polyps 10/27/2012   Follows with [facility]   Pneumonia    " walking"   Preventative health care    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   Wears glasses     Medications:  Scheduled:   acetaminophen  650 mg Oral Q4H   arformoterol  15 mcg  Nebulization BID   budesonide (PULMICORT) nebulizer solution  0.5 mg Nebulization BID   busPIRone  15 mg Oral TID   Chlorhexidine Gluconate Cloth  6 each Topical Daily   dextromethorphan-guaiFENesin  2 tablet Oral BID   insulin aspart  0-20 Units Subcutaneous TID WC   insulin aspart  0-5 Units Subcutaneous QHS   insulin aspart  4 Units Subcutaneous TID WC   insulin glargine-yfgn  18 Units Subcutaneous Daily   metoprolol succinate  12.5 mg Oral Daily   oxyCODONE  5 mg Oral Q4H   predniSONE  40 mg Oral Q breakfast   Followed by   Melene Muller ON 08/18/2023] predniSONE  30 mg Oral Q breakfast   Followed by   Melene Muller ON 08/21/2023] predniSONE  20 mg Oral Q breakfast   Followed by   Melene Muller ON 08/24/2023] predniSONE  10 mg Oral Q breakfast   Followed by   Melene Muller ON 08/27/2023] predniSONE  5 mg Oral Q breakfast   pregabalin  75 mg Oral BID   revefenacin  175 mcg Nebulization Daily   tamsulosin  0.8 mg Oral QPM   tiZANidine  6 mg Oral Q4H   venlafaxine XR  150 mg Oral Q breakfast   Warfarin - Pharmacist  Dosing Inpatient   Does not apply q1600   zolpidem  5 mg Oral QHS     Assessment: Patient is a 64 yoM admitted on 1/16 with acute on chronic respiratory failure. Pt found to have RSV as well as PNA (MSSA, stenotrophomonas) completed 7 days of antibiotics on 08/13/22.   PMH significant for atrial fibrillation, hx of PE/DVT anticoagulated PTA with warfarin. Warfarin was held while pt unable to take PO medications, resumed on 1/23. Pharmacy consulted to dose warfarin, with enoxaparin bridge while INR subtherapeutic.   Home warfarin dose: 5mg  daily except 7.5 mg on Tuesdays and Saturdays.  -INR on admission: 3.3  Anticoagulation During Admission: -Warfarin held: 1/16 & 1/17, 1/20 > 1/22 -Enoxaparin 1/20 >>  Today, 08/17/23: INR = 2 is now therapeutic CBC: Stable, SCr low Drug interactions: None major. Antibiotics completed 1/27 No bleeding reported  Enoxaparin discontinued by MD on 1/29.  Goal  of Therapy:  INR 2-3 Monitor platelets by anticoagulation protocol: Yes   Plan:  Warfarin 5 mg PO today INR daily  At this time, would recommend resuming patient's home dose at discharge.   Cindi Carbon, PharmD 08/17/23 7:25 AM

## 2023-08-17 NOTE — Progress Notes (Signed)
Inpatient Rehabilitation Admissions Coordinator   I await insurance determination for possible CIR admit.  Ottie Glazier, RN, MSN Rehab Admissions Coordinator [phone] 08/17/2023 8:39 AM

## 2023-08-17 NOTE — Plan of Care (Signed)
  Problem: Fluid Volume: Goal: Ability to maintain a balanced intake and output will improve Outcome: Progressing   Problem: Metabolic: Goal: Ability to maintain appropriate glucose levels will improve Outcome: Progressing   Problem: Nutritional: Goal: Maintenance of adequate nutrition will improve Outcome: Progressing   Problem: Skin Integrity: Goal: Risk for impaired skin integrity will decrease Outcome: Progressing   Problem: Clinical Measurements: Goal: Respiratory complications will improve Outcome: Progressing   Problem: Activity: Goal: Risk for activity intolerance will decrease Outcome: Progressing   Problem: Nutrition: Goal: Adequate nutrition will be maintained Outcome: Progressing

## 2023-08-17 NOTE — Plan of Care (Signed)
  Problem: Nutritional: Goal: Maintenance of adequate nutrition will improve Outcome: Progressing   Problem: Nutrition: Goal: Adequate nutrition will be maintained Outcome: Progressing   Problem: Health Behavior/Discharge Planning: Goal: Ability to manage health-related needs will improve Outcome: Not Progressing   Problem: Clinical Measurements: Goal: Diagnostic test results will improve Outcome: Not Progressing Goal: Respiratory complications will improve Outcome: Not Progressing   Problem: Activity: Goal: Risk for activity intolerance will decrease Outcome: Not Progressing

## 2023-08-17 NOTE — Progress Notes (Signed)
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 previously on Dalvance-had aspiration in 11/2020 on that admission 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 on that admission-currently on Coumadin Polymyalgia rheumatica Anxiety/depression  Events 1/15 brought to hospital with cough congestion shortness of breath---also severe hyponatremia and hyperglycemia 300-sodium 121 BUN/creatinine 17/0.5 WBC 14 platelet 345 TSH 19 RSV was positive Urine sodium was 18 urine aOsm was 601  - procedures 1/16 CXR = chronic coarsened interstitial markings bilaterally no superb and post acute process  Plan  Sepsis secondary to RSV, MSSA and stenotrophomonas pneumonia superimposed on interstitial lung disease Completed >10 days antibiotics 1/27  Outpatient follow-up with pulmonology--- is on a prolonged taper of prednisone back to baseline prednisone 5 mg at discharge Continues  albuterol every 4 as needed Brovana 15 twice daily Pulmicort 0.5 twice daily as well as Mucinex as needed and Yupelri 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 Start Toprol-XL 12.5 Continues on Coumadin as below Previous back surgery, back infections Resumed on Lyrica 75 twice daily, Zanaflex 6 mg every 4 hours Oxy IR 5 every 4  +Tylenol 650 Q4 additionally Severe hyponatremia/SIADH on admission Completley resolved at this time May continue regular diet HFpEF-EF 60-65% grade 1 DD no valvular abnormality See  above regarding metoprolol-does not appear volume overloaded BPH Continues on Flomax 0.8 nightly-outpatient follow-up Voiding without catherter now Depression Continue BuSpar 15 3 times daily, Effexor 150 AM, zolpidem 5 at bedtime for sleep DM TY 2 CBGs 170-300,  eating 100% of meals  on short acting, continue long-acting 18 units-- 4 units 3 times daily meals and sliding scale sugars will improve with de-escalation of steroids   DVT prophylaxis: On Coumadin  Status is: Inpatient Remains inpatient appropriate because: Requires in hospital management but likely can go to rehab whenever able   Subjective:  Well No distress  Walking some No cp fever chills   Objective + exam Vitals:   08/17/23 1100 08/17/23 1200 08/17/23 1300 08/17/23 1400  BP: (!) 149/128 (!) 164/64 (!) 99/52 112/60  Pulse: 89 91 97 91  Resp: (!) 23 (!) 24 16 (!) 21  Temp:  97.9 F (36.6 C)    TempSrc:  Oral    SpO2: 92% 94% 98% 99%  Weight:      Height:       Filed Weights   08/03/23 1857 08/15/23 0500 08/16/23 0500  Weight: 90 kg 84.6 kg 85.3 kg    Examination: EOMI NCAT no focal deficit No icterus no pallor Clear chest S1-S2 no murmur Abdomen soft No lower extremity edema   Data Reviewed: reviewed   CBC    Component Value Date/Time   WBC 6.6 08/17/2023 0328   RBC 3.68 (L) 08/17/2023 0328   HGB 10.7 (L) 08/17/2023 0328   HGB 11.9 (L) 10/05/2021 1429   HGB 12.8 (L) 11/18/2020 1608   HCT 33.6 (L) 08/17/2023 0328   PLT 400 08/17/2023 0328   PLT 322 10/05/2021 1429   MCV  91.3 08/17/2023 0328   MCH 29.1 08/17/2023 0328   MCHC 31.8 08/17/2023 0328   RDW 14.9 08/17/2023 0328   LYMPHSABS 0.2 (L) 08/17/2023 0328   MONOABS 0.4 08/17/2023 0328   EOSABS 0.0 08/17/2023 0328   BASOSABS 0.0 08/17/2023 0328      Latest Ref Rng & Units 08/17/2023    3:28 AM 08/15/2023    3:09 AM 08/11/2023    3:19 AM  CMP  Glucose 70 - 99 mg/dL 161  096  045   BUN 8 - 23 mg/dL 14  16  24    Creatinine 0.61  - 1.24 mg/dL 4.09  8.11  9.14   Sodium 135 - 145 mmol/L 131  132  134   Potassium 3.5 - 5.1 mmol/L 3.5  3.7  3.9   Chloride 98 - 111 mmol/L 98  96  97   CO2 22 - 32 mmol/L 26  29  28    Calcium 8.9 - 10.3 mg/dL 8.1  7.7  8.0   Total Protein 6.5 - 8.1 g/dL  4.8    Total Bilirubin 0.0 - 1.2 mg/dL  0.6    Alkaline Phos 38 - 126 U/L  53    AST 15 - 41 U/L  14    ALT 0 - 44 U/L  10      Scheduled Meds:  acetaminophen  650 mg Oral Q4H   arformoterol  15 mcg Nebulization BID   budesonide (PULMICORT) nebulizer solution  0.5 mg Nebulization BID   busPIRone  15 mg Oral TID   Chlorhexidine Gluconate Cloth  6 each Topical Daily   dextromethorphan-guaiFENesin  2 tablet Oral BID   insulin aspart  0-20 Units Subcutaneous TID WC   insulin aspart  0-5 Units Subcutaneous QHS   insulin aspart  4 Units Subcutaneous TID WC   insulin glargine-yfgn  18 Units Subcutaneous Daily   metoprolol succinate  12.5 mg Oral Daily   oxyCODONE  5 mg Oral Q4H   [START ON 08/18/2023] predniSONE  30 mg Oral Q breakfast   Followed by   Melene Muller ON 08/21/2023] predniSONE  20 mg Oral Q breakfast   Followed by   Melene Muller ON 08/24/2023] predniSONE  10 mg Oral Q breakfast   Followed by   Melene Muller ON 08/27/2023] predniSONE  5 mg Oral Q breakfast   pregabalin  75 mg Oral BID   revefenacin  175 mcg Nebulization Daily   tamsulosin  0.8 mg Oral QPM   tiZANidine  6 mg Oral Q4H   venlafaxine XR  150 mg Oral Q breakfast   warfarin  5 mg Oral ONCE-1600   Warfarin - Pharmacist Dosing Inpatient   Does not apply q1600   zolpidem  5 mg Oral QHS   Continuous Infusions:  Time  33  Rhetta Mura, MD  [facility]

## 2023-08-18 DIAGNOSIS — J189 Pneumonia, unspecified organism: Secondary | ICD-10-CM | POA: Diagnosis not present

## 2023-08-18 LAB — GLUCOSE, CAPILLARY
Glucose-Capillary: 193 mg/dL — ABNORMAL HIGH (ref 70–99)
Glucose-Capillary: 223 mg/dL — ABNORMAL HIGH (ref 70–99)
Glucose-Capillary: 175 mg/dL — ABNORMAL HIGH (ref 70–99)
Glucose-Capillary: 242 mg/dL — ABNORMAL HIGH (ref 70–99)
Glucose-Capillary: 302 mg/dL — ABNORMAL HIGH (ref 70–99)
Glucose-Capillary: 130 mg/dL — ABNORMAL HIGH (ref 70–99)

## 2023-08-18 LAB — CBC WITH DIFFERENTIAL/PLATELET
Abs Immature Granulocytes: 0.07 10*3/uL (ref 0.00–0.07)
Basophils Absolute: 0 10*3/uL (ref 0.0–0.1)
Basophils Relative: 0 %
Eosinophils Absolute: 0 10*3/uL (ref 0.0–0.5)
Eosinophils Relative: 1 %
HCT: 31.1 % — ABNORMAL LOW (ref 39.0–52.0)
Hemoglobin: 9.9 g/dL — ABNORMAL LOW (ref 13.0–17.0)
Immature Granulocytes: 1 %
Lymphocytes Relative: 3 %
Lymphs Abs: 0.2 10*3/uL — ABNORMAL LOW (ref 0.7–4.0)
MCH: 28.5 pg (ref 26.0–34.0)
MCHC: 31.8 g/dL (ref 30.0–36.0)
MCV: 89.6 fL (ref 80.0–100.0)
Monocytes Absolute: 0.5 10*3/uL (ref 0.1–1.0)
Monocytes Relative: 8 %
Neutro Abs: 4.8 10*3/uL (ref 1.7–7.7)
Neutrophils Relative %: 87 %
Platelets: 420 10*3/uL — ABNORMAL HIGH (ref 150–400)
RBC: 3.47 MIL/uL — ABNORMAL LOW (ref 4.22–5.81)
RDW: 15.1 % (ref 11.5–15.5)
WBC: 5.5 10*3/uL (ref 4.0–10.5)
nRBC: 0 % (ref 0.0–0.2)

## 2023-08-18 LAB — PROTIME-INR
INR: 2.6 — ABNORMAL HIGH (ref 0.8–1.2)
INR: 2.1 — ABNORMAL HIGH (ref 0.8–1.2)
Prothrombin Time: 28.2 s — ABNORMAL HIGH (ref 11.4–15.2)
Prothrombin Time: 23.4 s — ABNORMAL HIGH (ref 11.4–15.2)

## 2023-08-18 LAB — BASIC METABOLIC PANEL
Anion gap: 5 (ref 5–15)
BUN: 9 mg/dL (ref 8–23)
CO2: 28 mmol/L (ref 22–32)
Calcium: 7.7 mg/dL — ABNORMAL LOW (ref 8.9–10.3)
Chloride: 96 mmol/L — ABNORMAL LOW (ref 98–111)
Creatinine, Ser: 0.59 mg/dL — ABNORMAL LOW (ref 0.61–1.24)
GFR, Estimated: >60 mL/min (ref >60)
Glucose, Bld: 231 mg/dL — ABNORMAL HIGH (ref 70–99)
Potassium: 3.4 mmol/L — ABNORMAL LOW (ref 3.5–5.1)
Sodium: 129 mmol/L — ABNORMAL LOW (ref 135–145)

## 2023-08-18 LAB — LACTIC ACID, PLASMA: Lactic Acid, Venous: 1.4 mmol/L (ref 0.5–1.9)

## 2023-08-18 MED ORDER — TAMSULOSIN HCL 0.4 MG PO CAPS
0.4000 mg | ORAL_CAPSULE | Freq: Every evening | ORAL | Status: DC
  Administered 2023-08-19: 0.4 mg via ORAL
  Filled 2023-08-18: qty 1

## 2023-08-18 MED ORDER — SODIUM CHLORIDE 0.9 % IV BOLUS
500.0000 mL | Freq: Once | INTRAVENOUS | Status: AC
  Administered 2023-08-18: 500 mL via INTRAVENOUS

## 2023-08-18 MED ORDER — ACETAMINOPHEN 325 MG PO TABS
650.0000 mg | ORAL_TABLET | ORAL | Status: DC
  Administered 2023-08-18 – 2023-08-20 (×9): 650 mg via ORAL
  Filled 2023-08-18 (×10): qty 2

## 2023-08-18 MED ORDER — WARFARIN SODIUM 5 MG PO TABS
5.0000 mg | ORAL_TABLET | Freq: Once | ORAL | Status: AC
  Administered 2023-08-18: 5 mg via ORAL
  Filled 2023-08-18: qty 1

## 2023-08-18 MED ORDER — INSULIN ASPART 100 UNIT/ML IJ SOLN
6.0000 [IU] | Freq: Three times a day (TID) | INTRAMUSCULAR | Status: DC
  Administered 2023-08-19 – 2023-08-20 (×3): 6 [IU] via SUBCUTANEOUS

## 2023-08-18 MED ORDER — INSULIN GLARGINE-YFGN 100 UNIT/ML ~~LOC~~ SOLN
25.0000 [IU] | Freq: Every day | SUBCUTANEOUS | Status: DC
  Administered 2023-08-19 – 2023-08-20 (×2): 25 [IU] via SUBCUTANEOUS
  Filled 2023-08-18 (×2): qty 0.25

## 2023-08-18 NOTE — Plan of Care (Signed)
  Problem: Education: Goal: Ability to describe self-care measures that may prevent or decrease complications (Diabetes Survival Skills Education) will improve Outcome: Progressing Goal: Individualized Educational Video(s) Outcome: Progressing   Problem: Coping: Goal: Ability to adjust to condition or change in health will improve Outcome: Progressing   Problem: Fluid Volume: Goal: Ability to maintain a balanced intake and output will improve Outcome: Progressing   Problem: Health Behavior/Discharge Planning: Goal: Ability to identify and utilize available resources and services will improve Outcome: Progressing Goal: Ability to manage health-related needs will improve Outcome: Progressing   Problem: Metabolic: Goal: Ability to maintain appropriate glucose levels will improve Outcome: Progressing   Problem: Nutritional: Goal: Maintenance of adequate nutrition will improve Outcome: Progressing Goal: Progress toward achieving an optimal weight will improve Outcome: Progressing   Problem: Skin Integrity: Goal: Risk for impaired skin integrity will decrease Outcome: Progressing   Problem: Tissue Perfusion: Goal: Adequacy of tissue perfusion will improve Outcome: Progressing   Problem: Education: Goal: Knowledge of General Education information will improve Description: Including pain rating scale, medication(s)/side effects and non-pharmacologic comfort measures Outcome: Progressing   Problem: Health Behavior/Discharge Planning: Goal: Ability to manage health-related needs will improve Outcome: Progressing   Problem: Clinical Measurements: Goal: Will remain free from infection Outcome: Progressing Goal: Diagnostic test results will improve Outcome: Progressing Goal: Respiratory complications will improve Outcome: Progressing Goal: Cardiovascular complication will be avoided Outcome: Progressing   Problem: Activity: Goal: Risk for activity intolerance will  decrease Outcome: Progressing   Problem: Nutrition: Goal: Adequate nutrition will be maintained Outcome: Progressing   Problem: Coping: Goal: Level of anxiety will decrease Outcome: Progressing   Problem: Elimination: Goal: Will not experience complications related to bowel motility Outcome: Progressing Goal: Will not experience complications related to urinary retention Outcome: Progressing   Problem: Pain Managment: Goal: General experience of comfort will improve and/or be controlled Outcome: Progressing   Problem: Safety: Goal: Ability to remain free from injury will improve Outcome: Progressing   Problem: Skin Integrity: Goal: Risk for impaired skin integrity will decrease Outcome: Progressing   Problem: Education: Goal: Knowledge of disease or condition will improve Outcome: Progressing Goal: Knowledge of the prescribed therapeutic regimen will improve Outcome: Progressing Goal: Individualized Educational Video(s) Outcome: Progressing   Problem: Activity: Goal: Ability to tolerate increased activity will improve Outcome: Progressing Goal: Will verbalize the importance of balancing activity with adequate rest periods Outcome: Progressing   Problem: Respiratory: Goal: Ability to maintain a clear airway will improve Outcome: Progressing Goal: Levels of oxygenation will improve Outcome: Progressing Goal: Ability to maintain adequate ventilation will improve Outcome: Progressing

## 2023-08-18 NOTE — Progress Notes (Signed)
PHARMACY - ANTICOAGULATION CONSULT NOTE  Pharmacy Consult for Warfarin Indication: atrial fibrillation and hx DVT  Allergies  Allergen Reactions   Daptomycin     Drug induced pneumonia    Diltiazem Other (See Comments)    Passed out   Ofev [Nintedanib] Diarrhea    fatigue   Pirfenidone Diarrhea    fatigue    Patient Measurements: Height: 5\' 9"  (175.3 cm) Weight: 89.8 kg (197 lb 15.6 oz) IBW/kg (Calculated) : 70.7  Vital Signs: Temp: 97.8 F (36.6 C) (01/31 0817) Temp Source: Oral (01/31 0817) BP: 107/52 (01/31 1000) Pulse Rate: 87 (01/31 1000)  Labs: Recent Labs    08/17/23 0328 08/18/23 0317 08/18/23 0603  HGB 10.7*  --  9.9*  HCT 33.6*  --  31.1*  PLT 400  --  420*  LABPROT 22.7* 28.2* 23.4*  INR 2.0* 2.6* 2.1*  CREATININE 0.61  --  0.59*    Estimated Creatinine Clearance: 82.9 mL/min (A) (by C-G formula based on SCr of 0.59 mg/dL (L)).   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   Esophageal reflux 02/10/2015   Fatigue    HTN (hypertension)    Hyperglycemia    preDM    Hyperlipidemia    Insomnia    Low back pain radiating to both legs 01/16/2017   Measles as a child   Overweight(278.02)    Peripheral neuropathy 08/07/2019   Personal history of colonic polyps 10/27/2012   Follows with [facility]   Pneumonia    " walking"   Preventative health care    Testosterone deficiency 05/23/2012   Wears glasses     Medications:  Scheduled:   arformoterol  15 mcg Nebulization BID    budesonide (PULMICORT) nebulizer solution  0.5 mg Nebulization BID   busPIRone  15 mg Oral TID   Chlorhexidine Gluconate Cloth  6 each Topical Daily   dextromethorphan-guaiFENesin  2 tablet Oral BID   insulin aspart  0-20 Units Subcutaneous TID WC   insulin aspart  0-5 Units Subcutaneous QHS   insulin aspart  4 Units Subcutaneous TID WC   insulin glargine-yfgn  18 Units Subcutaneous Daily   metoprolol succinate  12.5 mg Oral Daily   oxyCODONE  5 mg Oral Q4H   predniSONE  30 mg Oral Q breakfast   Followed by   Melene Muller ON 08/21/2023] predniSONE  20 mg Oral Q breakfast   Followed by   Melene Muller ON 08/24/2023] predniSONE  10 mg Oral Q breakfast   Followed by   Melene Muller ON 08/27/2023] predniSONE  5 mg Oral Q breakfast   pregabalin  75 mg Oral BID   revefenacin  175 mcg Nebulization Daily   tamsulosin  0.8 mg Oral QPM   tiZANidine  6 mg Oral Q4H   venlafaxine XR  150 mg Oral Q breakfast   Warfarin - Pharmacist Dosing Inpatient   Does not apply q1600   zolpidem  5 mg Oral QHS     Assessment: Patient  is a 54 yoM admitted on 1/16 with acute on chronic respiratory failure. Pt found to have RSV as well as PNA (MSSA, stenotrophomonas) completed 7 days of antibiotics on 08/13/22.   PMH significant for atrial fibrillation, hx of PE/DVT anticoagulated PTA with warfarin. Warfarin was held while pt unable to take PO medications, resumed on 1/23. Pharmacy consulted to dose warfarin, with enoxaparin bridge while INR subtherapeutic.   Home warfarin dose: 5mg  daily except 7.5 mg on Tuesdays and Saturdays.  -INR on admission: 3.3  Anticoagulation During Admission: -Warfarin held: 1/16 & 1/17, 1/20 > 1/22 -Enoxaparin 1/20 >> 1/28  Today, 08/18/23: INR = 2.1, therapeutic CBC: Stable, SCr low Drug interactions: None major. Antibiotics completed 1/27 No bleeding reported  Goal of Therapy:  INR 2-3 Monitor platelets by anticoagulation protocol: Yes   Plan:  Warfarin 5 mg PO today INR daily  At this time,  would recommend resuming patient's home dose at discharge.   Lynden Ang, PharmD, BCPS 08/18/23 10:09 AM

## 2023-08-18 NOTE — Progress Notes (Signed)
Inpatient Rehabilitation Admissions Coordinator   I contacted [facility] Medicare. Determination for possible Cir admit is still pending. I discussed with patient's wife yesterday, that he is progressing well and may progress for direct discharge home.  Ottie Glazier, RN, MSN Rehab Admissions Coordinator [phone] 08/18/2023 10:45 AM

## 2023-08-18 NOTE — Progress Notes (Signed)
Report called. Pt notified & wife. Belonging collected. Pt stable at time of transfer

## 2023-08-18 NOTE — Progress Notes (Signed)
Physical Therapy Treatment Patient Details Name: Luis Brown MRN: [id number] DOB: 1946-05-14 Today's Date: 08/18/2023   History of Present Illness Patient is a 78 year old male who presented with cough, congestion, and worsening shortness of breath.  He was tachycardic, tachypneic, and had leukocytosis on admission.  Positive for RSV.  Dx: Acute on chronic hypoxic resp failure  secondary to RSV viral PNA complicated further by secondary staph and stenotrophamonas PNA superimposed on Pulm fibrosis/ILD and COPD.  PMH eosinophilic pneumonitis, ILD, pulmonary fibrosis, COPD, chronic hypoxia on 2 L O2 at home, PAF, history of PE and DVT on Coumadin, GERD, malnutrition, HLD, anxiety/depression, PMR    PT Comments   Patient reports having a busy morning, stated he was fatigued after last PT ambulation. Patient ambulated 40', reporting more SOB. SPO2 on 2 LPM dropped to 855, placed on 3 LPM to ambulate distance , SPO2 lowest 88%.   Patient left sitting  in recliner.call bell nearby.  Patient will benefit from intensive inpatient follow-up therapy, >3 hours/day    If plan is discharge home, recommend the following: A little help with walking and/or transfers;A little help with bathing/dressing/bathroom;Help with stairs or ramp for entrance;Assistance with cooking/housework   Can travel by private vehicle        Equipment Recommendations  None recommended by PT    Recommendations for Other Services Rehab consult     Precautions / Restrictions Precautions Precautions: Fall Precaution Comments: chronic 2L O2; monitor HR Restrictions Weight Bearing Restrictions Per Provider Order: No     Mobility  Bed Mobility   Bed Mobility: Supine to Sit     Supine to sit: Supervision     General bed mobility comments: for lines    Transfers Overall transfer level: Needs assistance Equipment used: Rolling walker (2 wheels) Transfers: Sit to/from Stand Sit to Stand: Contact guard assist            General transfer comment: cues  for safety    Ambulation/Gait Ambulation/Gait assistance: Min assist Gait Distance (Feet): 40 Feet Assistive device: Rolling walker (2 wheels) Gait Pattern/deviations: Step-to pattern, Step-through pattern       General Gait Details: Patient ambulated slowly, stopped multiple times to get breath, encouraged Pursed lip.   Stairs             Wheelchair Mobility     Tilt Bed    Modified Rankin (Stroke Patients Only)       Balance Overall balance assessment: Needs assistance Sitting-balance support: Feet supported Sitting balance-Leahy Scale: Good     Standing balance support: Reliant on assistive device for balance, During functional activity, Single extremity supported Standing balance-Leahy Scale: Fair                              Cognition Arousal: Alert Behavior During Therapy: WFL for tasks assessed/performed, Impulsive                                            Exercises      General Comments        Pertinent Vitals/Pain Pain Assessment Pain Assessment: No/denies pain    Home Living                          Prior Function  PT Goals (current goals can now be found in the care plan section) Progress towards PT goals: Progressing toward goals    Frequency    Min 1X/week      PT Plan      Co-evaluation              AM-PAC PT "6 Clicks" Mobility   Outcome Measure  Help needed turning from your back to your side while in a flat bed without using bedrails?: None Help needed moving from lying on your back to sitting on the side of a flat bed without using bedrails?: None Help needed moving to and from a bed to a chair (including a wheelchair)?: A Little Help needed standing up from a chair using your arms (e.g., wheelchair or bedside chair)?: A Little Help needed to walk in hospital room?: A Little Help needed climbing 3-5 steps with a  railing? : A Lot 6 Click Score: 19    End of Session Equipment Utilized During Treatment: Gait belt;Oxygen Activity Tolerance: Patient limited by fatigue;Treatment limited secondary to medical complications (Comment) Patient left: in chair;with call bell/phone within reach Nurse Communication: Mobility status PT Visit Diagnosis: Difficulty in walking, not elsewhere classified (R26.2)     Time: 3244-0102 PT Time Calculation (min) (ACUTE ONLY): 28 min  Charges:    $Gait Training: 23-37 mins PT General Charges $$ ACUTE PT VISIT: 1 Visit                     Blanchard Kelch PT Acute Rehabilitation Services Office [phone] Weekend pager-[phone]    Rada Hay 08/18/2023, 1:36 PM

## 2023-08-18 NOTE — Progress Notes (Signed)
     Patient Name: Luis Brown           DOB: [birth year]  MRN: [id number]      Admission Date: 08/03/2023  Attending Provider: Rhetta Mura, MD  Primary Diagnosis: Multifocal pneumonia   Level of care: Telemetry   OVERNIGHT PROGRESS REPORT   BP 80/ 40s.   Asymptomatic.  Afebrile.  Alert and oriented.  No acute changes reported. No overt bleeding noted, prior hemoglobin 10.7.  No large volume loss (GI/urinary).  Tonight, patient has received oxycodone, Lyrica, Zanaflex, Ambien which could contribute to low BP.  Hold sedating, antihypertensive, pain meds for now.     Plan:  IVF bolus, 500 cc CBC, BMP, Lactic acid.     Luis Harada, DNP, ACNPC- AG [facility]

## 2023-08-18 NOTE — Progress Notes (Signed)
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 I spoke with his wife by phone and she is aware. She would like to discuss with Dr Mahala Menghini before deciding SNF vs HH . She also has concerns about his drop in BPs. TOC is aware. We will sign off.  Ottie Glazier, RN, MSN Rehab Admissions Coordinator [phone] 08/18/2023 2:09 PM

## 2023-08-18 NOTE — Progress Notes (Signed)
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 previously on Dalvance-had aspiration in 11/2020 on that admission 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 on that admission-currently on Coumadin Polymyalgia rheumatica Anxiety/depression  Events 1/15 brought to hospital with cough congestion shortness of breath---also severe hyponatremia and hyperglycemia 300-sodium 121 BUN/creatinine 17/0.5 WBC 14 platelet 345 TSH 19 RSV was positive Urine sodium was 18 urine aOsm was 601  - procedures 1/16 CXR = chronic coarsened interstitial markings bilaterally no superb and post acute process  Plan  Sepsis secondary to RSV, MSSA and stenotrophomonas pneumonia superimposed on interstitial lung disease Completed >10 days antibiotics 1/27  Outpatient follow-up with pulmonology--- complete Taper of prednisone back to baseline prednisone 5 mg at discharge Continue albuterol every 4 as needed Brovana 15 twice daily Pulmicort 0.5 twice daily as well as Mucinex as needed and Yupelri 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 Start Toprol-XL 12.5, wathc blood pressure as occasional lows--cut back flomax to 0.4 Continues on Coumadin as below Previous back surgery, back infections Resumed on Lyrica 75 twice daily, Zanaflex 6 mg every 4 hours Oxy IR 5 every 4  +Tylenol 650 Q4 additionally Severe hyponatremia/SIADH on admission resolved at this time continue regular diet HFpEF-EF 60-65% grade  1 DD no valvular abnormality See above regarding metoprolol-does not appear volume overloaded BPH Continues on Flomax 0.4 nightly-outpatient follow-up Voiding without catherter now Depression Continue BuSpar 15 3 times daily, Effexor 150 AM, zolpidem 5 at bedtime for sleep DM TY 2 CBGs 170-300,  eating 100% of meals  on short acting, increased long-acting 18-->25 units-- 4 -->6units 3 times daily meals and sliding scale sugars will improve with de-escalation of steroids   DVT prophylaxis: On Coumadin  Status is: Inpatient Remains inpatient appropriate because: Requires in hospital management but likely can go to rehab whenever able   Subjective:  Upset about no CIR bed Feels fair No chest pain Otherwise seems well   Objective + exam Vitals:   08/18/23 1150 08/18/23 1200 08/18/23 1448 08/18/23 1602  BP: (!) 160/68   93/77  Pulse: 94 (!) 105  91  Resp:      Temp:   97.6 F (36.4 C) 97.6 F (36.4 C)  TempSrc:   Oral Oral  SpO2:  (!) 74%  98%  Weight:      Height:       Filed Weights   08/15/23 0500 08/16/23 0500 08/18/23 0500  Weight: 84.6 kg 85.3 kg 89.8 kg    Examination: EOMI NCAT no focal deficit No icterus no pallor Clear chest no wheeze no rales S1-S2 no murmur Abdomen soft No lower extremity edema   Data Reviewed: reviewed   CBC    Component Value Date/Time   WBC 5.5 08/18/2023 0603   RBC 3.47 (L) 08/18/2023 0603   HGB 9.9 (L) 08/18/2023 0603   HGB 11.9 (L) 10/05/2021 1429   HGB 12.8 (L) 11/18/2020 1608   HCT 31.1 (L) 08/18/2023 0603   PLT 420 (  H) 08/18/2023 0603   PLT 322 10/05/2021 1429   MCV 89.6 08/18/2023 0603   MCH 28.5 08/18/2023 0603   MCHC 31.8 08/18/2023 0603   RDW 15.1 08/18/2023 0603   LYMPHSABS 0.2 (L) 08/18/2023 0603   MONOABS 0.5 08/18/2023 0603   EOSABS 0.0 08/18/2023 0603   BASOSABS 0.0 08/18/2023 0603      Latest Ref Rng & Units 08/18/2023    6:03 AM 08/17/2023    3:28 AM 08/15/2023    3:09 AM  CMP  Glucose 70 - 99  mg/dL 161  096  045   BUN 8 - 23 mg/dL 9  14  16    Creatinine 0.61 - 1.24 mg/dL 4.09  8.11  9.14   Sodium 135 - 145 mmol/L 129  131  132   Potassium 3.5 - 5.1 mmol/L 3.4  3.5  3.7   Chloride 98 - 111 mmol/L 96  98  96   CO2 22 - 32 mmol/L 28  26  29    Calcium 8.9 - 10.3 mg/dL 7.7  8.1  7.7   Total Protein 6.5 - 8.1 g/dL   4.8   Total Bilirubin 0.0 - 1.2 mg/dL   0.6   Alkaline Phos 38 - 126 U/L   53   AST 15 - 41 U/L   14   ALT 0 - 44 U/L   10     Scheduled Meds:  arformoterol  15 mcg Nebulization BID   budesonide (PULMICORT) nebulizer solution  0.5 mg Nebulization BID   busPIRone  15 mg Oral TID   Chlorhexidine Gluconate Cloth  6 each Topical Daily   dextromethorphan-guaiFENesin  2 tablet Oral BID   insulin aspart  0-20 Units Subcutaneous TID WC   insulin aspart  0-5 Units Subcutaneous QHS   insulin aspart  4 Units Subcutaneous TID WC   insulin glargine-yfgn  18 Units Subcutaneous Daily   metoprolol succinate  12.5 mg Oral Daily   oxyCODONE  5 mg Oral Q4H   predniSONE  30 mg Oral Q breakfast   Followed by   Melene Muller ON 08/21/2023] predniSONE  20 mg Oral Q breakfast   Followed by   Melene Muller ON 08/24/2023] predniSONE  10 mg Oral Q breakfast   Followed by   Melene Muller ON 08/27/2023] predniSONE  5 mg Oral Q breakfast   pregabalin  75 mg Oral BID   revefenacin  175 mcg Nebulization Daily   tamsulosin  0.8 mg Oral QPM   tiZANidine  6 mg Oral Q4H   venlafaxine XR  150 mg Oral Q breakfast   Warfarin - Pharmacist Dosing Inpatient   Does not apply q1600   zolpidem  5 mg Oral QHS   Continuous Infusions:  Time  33  Rhetta Mura, MD  [facility]

## 2023-08-18 NOTE — TOC Progression Note (Addendum)
Transition of Care [facility]) - Progression Note    Patient Details  Name: Luis Brown MRN: [id number] Date of Birth: 09-Jun-1946  Transition [facility]) CM/SW Contact  Luis Busing, RN Phone Number:[phone]  08/18/2023, 2:23 PM  Clinical Narrative:    CM received call to confirm that insurance auth for inpatient rehab has been denied. Options are #1 SNF authorization will automatically be approved if submitted within 24 hours. #2 Patient/family may appeal the denial for inpatient rehab by calling [phone] fast appeal. CM has called spouse Luis Brown to make her aware of her options. Spouse states that she would like to speak with Dr. Mahala Menghini about drops in the patients blood pressure before making a decision on Home health versus SNF. No workup has been initiated at this time due to wife does not consent until her questions are addressed.   MD has been made aware that wife wishes to speak with him per Ottie Glazier. Md agrees that he will follow up with wife a little later.   1449 MD sent CM message stating he has spoken with family. CM will follow up.   1502 CM called wife to discuss disposition options. CM explaining options and wife states that nurse just walked in and she will have to call CM back. Will await return call.   1611 CM spoke with wife. Wife does not want to send patient to a SNF and is requesting time at least a day to set up caregivers in the home for a safe discharge plan. MD has been updated and ok with this plan. Wife calling now to get caregivers set up asap.      Barriers to Discharge: Continued Medical Work up  Expected Discharge Plan and Services                                               Social Determinants of Health (SDOH) Interventions SDOH Screenings   Food Insecurity: No Food Insecurity (08/04/2023)  Housing: Low Risk  (08/04/2023)  Transportation Needs: No Transportation Needs (08/04/2023)  Utilities: Not At Risk  (08/04/2023)  Alcohol Screen: Low Risk  (01/13/2023)  Depression (PHQ2-9): Low Risk  (02/20/2023)  Financial Resource Strain: Low Risk  (01/13/2023)  Physical Activity: Inactive (01/13/2023)  Social Connections: Socially Integrated (08/04/2023)  Stress: Stress Concern Present (01/13/2023)  Tobacco Use: Medium Risk (08/03/2023)    Readmission Risk Interventions    08/04/2023    7:08 PM  Readmission Risk Prevention Plan  Transportation Screening Complete  PCP or Specialist Appt within 3-5 Days Complete  HRI or Home Care Consult Complete  Social Work Consult for Recovery Care Planning/Counseling Complete  Palliative Care Screening Not Applicable  Medication Review [employer]) Complete

## 2023-08-18 NOTE — Progress Notes (Signed)
Inpatient Rehabilitation Admissions Coordinator   Insurance is requesting peer to peer by treating MD. I have notified Dr Mahala Menghini of their request. I await their determination.  Ottie Glazier, RN, MSN Rehab Admissions Coordinator [phone] 08/18/2023 1:10 PM

## 2023-08-19 DIAGNOSIS — J189 Pneumonia, unspecified organism: Secondary | ICD-10-CM | POA: Diagnosis not present

## 2023-08-19 LAB — CBC WITH DIFFERENTIAL/PLATELET
Abs Immature Granulocytes: 0.1 10*3/uL — ABNORMAL HIGH (ref 0.00–0.07)
Basophils Absolute: 0 10*3/uL (ref 0.0–0.1)
Basophils Relative: 0 %
Eosinophils Absolute: 0 10*3/uL (ref 0.0–0.5)
Eosinophils Relative: 1 %
HCT: 36.2 % — ABNORMAL LOW (ref 39.0–52.0)
Hemoglobin: 11.5 g/dL — ABNORMAL LOW (ref 13.0–17.0)
Immature Granulocytes: 1 %
Lymphocytes Relative: 2 %
Lymphs Abs: 0.2 10*3/uL — ABNORMAL LOW (ref 0.7–4.0)
MCH: 28.9 pg (ref 26.0–34.0)
MCHC: 31.8 g/dL (ref 30.0–36.0)
MCV: 91 fL (ref 80.0–100.0)
Monocytes Absolute: 0.6 10*3/uL (ref 0.1–1.0)
Monocytes Relative: 7 %
Neutro Abs: 6.9 10*3/uL (ref 1.7–7.7)
Neutrophils Relative %: 89 %
Platelets: 452 10*3/uL — ABNORMAL HIGH (ref 150–400)
RBC: 3.98 MIL/uL — ABNORMAL LOW (ref 4.22–5.81)
RDW: 15.4 % (ref 11.5–15.5)
WBC: 7.8 10*3/uL (ref 4.0–10.5)
nRBC: 0 % (ref 0.0–0.2)

## 2023-08-19 LAB — BASIC METABOLIC PANEL
Anion gap: 7 (ref 5–15)
BUN: 8 mg/dL (ref 8–23)
CO2: 30 mmol/L (ref 22–32)
Calcium: 8.6 mg/dL — ABNORMAL LOW (ref 8.9–10.3)
Chloride: 98 mmol/L (ref 98–111)
Creatinine, Ser: 0.64 mg/dL (ref 0.61–1.24)
GFR, Estimated: >60 mL/min (ref >60)
Glucose, Bld: 143 mg/dL — ABNORMAL HIGH (ref 70–99)
Potassium: 3.9 mmol/L (ref 3.5–5.1)
Sodium: 135 mmol/L (ref 135–145)

## 2023-08-19 LAB — PROTIME-INR
INR: 1.8 — ABNORMAL HIGH (ref 0.8–1.2)
Prothrombin Time: 21.2 s — ABNORMAL HIGH (ref 11.4–15.2)

## 2023-08-19 LAB — GLUCOSE, CAPILLARY
Glucose-Capillary: 161 mg/dL — ABNORMAL HIGH (ref 70–99)
Glucose-Capillary: 323 mg/dL — ABNORMAL HIGH (ref 70–99)
Glucose-Capillary: 259 mg/dL — ABNORMAL HIGH (ref 70–99)
Glucose-Capillary: 308 mg/dL — ABNORMAL HIGH (ref 70–99)
Glucose-Capillary: 152 mg/dL — ABNORMAL HIGH (ref 70–99)

## 2023-08-19 MED ORDER — WARFARIN SODIUM 5 MG PO TABS
7.5000 mg | ORAL_TABLET | Freq: Once | ORAL | Status: AC
  Administered 2023-08-19: 7.5 mg via ORAL
  Filled 2023-08-19: qty 1

## 2023-08-19 NOTE — Progress Notes (Signed)
PHARMACY - ANTICOAGULATION CONSULT NOTE  Pharmacy Consult for Warfarin Indication: atrial fibrillation and hx DVT  Allergies  Allergen Reactions   Daptomycin     Drug induced pneumonia    Diltiazem Other (See Comments)    Passed out   Ofev [Nintedanib] Diarrhea    fatigue   Pirfenidone Diarrhea    fatigue    Patient Measurements: Height: 5\' 9"  (175.3 cm) Weight: 84.6 kg (186 lb 8.2 oz) IBW/kg (Calculated) : 70.7  Vital Signs: Temp: 97.7 F (36.5 C) (02/01 1257) Temp Source: Oral (02/01 1257) BP: 155/87 (02/01 1257) Pulse Rate: 84 (02/01 1257)  Labs: Recent Labs    08/17/23 0328 08/18/23 0317 08/18/23 0603 08/19/23 0712  HGB 10.7*  --  9.9* 11.5*  HCT 33.6*  --  31.1* 36.2*  PLT 400  --  420* 452*  LABPROT 22.7* 28.2* 23.4* 21.2*  INR 2.0* 2.6* 2.1* 1.8*  CREATININE 0.61  --  0.59* 0.64    Estimated Creatinine Clearance: 74.9 mL/min (by C-G formula based on SCr of 0.64 mg/dL).   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   Esophageal reflux 02/10/2015   Fatigue    HTN (hypertension)    Hyperglycemia    preDM    Hyperlipidemia    Insomnia    Low back pain radiating to both legs 01/16/2017   Measles as a child   Overweight(278.02)    Peripheral neuropathy 08/07/2019   Personal history of colonic polyps 10/27/2012   Follows with [facility]   Pneumonia    " walking"   Preventative health care    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   Wears glasses     Medications:  Scheduled:    acetaminophen  650 mg Oral Q4H   arformoterol  15 mcg Nebulization BID   budesonide (PULMICORT) nebulizer solution  0.5 mg Nebulization BID   busPIRone  15 mg Oral TID   Chlorhexidine Gluconate Cloth  6 each Topical Daily   dextromethorphan-guaiFENesin  2 tablet Oral BID   insulin aspart  0-20 Units Subcutaneous TID WC   insulin aspart  0-5 Units Subcutaneous QHS   insulin aspart  6 Units Subcutaneous TID WC   insulin glargine-yfgn  25 Units Subcutaneous Daily   metoprolol succinate  12.5 mg Oral Daily   oxyCODONE  5 mg Oral Q4H   predniSONE  30 mg Oral Q breakfast   Followed by   Melene Muller ON 08/21/2023] predniSONE  20 mg Oral Q breakfast   Followed by   Melene Muller ON 08/24/2023] predniSONE  10 mg Oral Q breakfast   Followed by   Melene Muller ON 08/27/2023] predniSONE  5 mg Oral Q breakfast   pregabalin  75 mg Oral BID   revefenacin  175 mcg Nebulization Daily   tamsulosin  0.4 mg Oral QPM   tiZANidine  6 mg Oral Q4H   venlafaxine XR  150 mg Oral Q breakfast   Warfarin - Pharmacist Dosing Inpatient   Does not apply q1600  zolpidem  5 mg Oral QHS     Assessment: Patient is a 26 yoM admitted on 1/16 with acute on chronic respiratory failure. Pt found to have RSV as well as PNA (MSSA, stenotrophomonas) completed 7 days of antibiotics on 08/13/22.   PMH significant for atrial fibrillation, hx of PE/DVT anticoagulated PTA with warfarin. Warfarin was held while pt unable to take PO medications, resumed on 1/23. Pharmacy consulted to dose warfarin, with enoxaparin bridge while INR subtherapeutic.   Home warfarin dose: 5mg  daily except 7.5 mg on Tuesdays and Saturdays.  -INR on admission: 3.3  Anticoagulation During Admission: -Warfarin held: 1/16 & 1/17, 1/20 > 1/22 -Enoxaparin 1/20 >> 1/28  Today, 08/19/23: INR = 1.8, sub-therapeutic CBC: Stable, SCr low Drug interactions: None major. Antibiotics completed 1/27 No bleeding reported  Goal of Therapy:  INR 2-3 Monitor platelets by  anticoagulation protocol: Yes   Plan:  Warfarin 7.5 mg PO today INR daily  At this time, would recommend resuming patient's home dose at discharge.   Lynden Ang, PharmD, BCPS 08/19/23 1:25 PM

## 2023-08-19 NOTE — Progress Notes (Signed)
Physical Therapy Treatment Patient Details Name: Luis Brown MRN: [id number] DOB: [birth year] Today's Date: 08/19/2023   History of Present Illness Patient is a 78 year old male who presented with cough, congestion, and worsening shortness of breath.  He was tachycardic, tachypneic, and had leukocytosis on admission.  Positive for RSV.  Dx: Acute on chronic hypoxic resp failure  secondary to RSV viral PNA complicated further by secondary staph and stenotrophamonas PNA superimposed on Pulm fibrosis/ILD and COPD.  PMH eosinophilic pneumonitis, ILD, pulmonary fibrosis, COPD, chronic hypoxia on 2 L O2 at home, PAF, history of PE and DVT on Coumadin, GERD, malnutrition, HLD, anxiety/depression, PMR    PT Comments  Pt's HR elevated to 142 with 25' of gait on 3 L/min o2 with RW and o2 95%. Ambulated 25 feet back to room.  BP 161/79.  HR down to 119 when PT left and RN was informed. Recommend therapy post acute d/c. Noted that wife wants to take him home.    If plan is discharge home, recommend the following: A little help with walking and/or transfers;A little help with bathing/dressing/bathroom   Can travel by private vehicle        Equipment Recommendations  None recommended by PT    Recommendations for Other Services       Precautions / Restrictions Precautions Precautions: Fall Precaution Comments: chronic 2L O2; monitor HR     Mobility  Bed Mobility               General bed mobility comments: up in recliner upon arrival    Transfers Overall transfer level: Needs assistance Equipment used: Rolling walker (2 wheels) Transfers: Sit to/from Stand Sit to Stand: Contact guard assist           General transfer comment: CGA for safety- impulsive    Ambulation/Gait Ambulation/Gait assistance: Min assist, Contact guard assist Gait Distance (Feet): 50 Feet Assistive device: Rolling walker (2 wheels) Gait Pattern/deviations: Decreased step length - left, Decreased step  length - right, Step-through pattern       General Gait Details: Ambulated halfway on 3 L/min and HR up to 142 abnd o2 95% so turned around BP 161/79 in room   Stairs             Wheelchair Mobility     Tilt Bed    Modified Rankin (Stroke Patients Only)       Balance                                            Cognition Arousal: Alert Behavior During Therapy: WFL for tasks assessed/performed, Impulsive Overall Cognitive Status: Within Functional Limits for tasks assessed                                          Exercises      General Comments        Pertinent Vitals/Pain Pain Assessment Pain Assessment: No/denies pain    Home Living                          Prior Function            PT Goals (current goals can now be found in the care plan section) Acute Rehab PT  Goals PT Goal Formulation: With patient Time For Goal Achievement: 08/25/23 Potential to Achieve Goals: Good Progress towards PT goals: Progressing toward goals    Frequency    Min 1X/week      PT Plan      Co-evaluation              AM-PAC PT "6 Clicks" Mobility   Outcome Measure  Help needed turning from your back to your side while in a flat bed without using bedrails?: None Help needed moving from lying on your back to sitting on the side of a flat bed without using bedrails?: None Help needed moving to and from a bed to a chair (including a wheelchair)?: A Little Help needed standing up from a chair using your arms (e.g., wheelchair or bedside chair)?: A Little Help needed to walk in hospital room?: A Little Help needed climbing 3-5 steps with a railing? : A Lot 6 Click Score: 19    End of Session Equipment Utilized During Treatment: Gait belt;Oxygen Activity Tolerance: Patient limited by fatigue;Treatment limited secondary to medical complications (Comment) (HR) Patient left: in chair;with call bell/phone within  reach;with family/visitor present Nurse Communication: Mobility status PT Visit Diagnosis: Difficulty in walking, not elsewhere classified (R26.2)     Time: 4782-9562 PT Time Calculation (min) (ACUTE ONLY): 24 min  Charges:    $Gait Training: 8-22 mins $Therapeutic Activity: 8-22 mins PT General Charges $$ ACUTE PT VISIT: 1 Visit                    Colin Broach., PT Office [phone] Acute Rehab 08/19/2023    Luis Brown 08/19/2023, 3:19 PM

## 2023-08-19 NOTE — Plan of Care (Signed)
   Problem: Education: Goal: Ability to describe self-care measures that may prevent or decrease complications (Diabetes Survival Skills Education) will improve Outcome: Progressing Goal: Individualized Educational Video(s) Outcome: Progressing   Problem: Coping: Goal: Ability to adjust to condition or change in health will improve Outcome: Progressing

## 2023-08-19 NOTE — Progress Notes (Signed)
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 previously on Dalvance-had aspiration in 11/2020 on that admission 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 on that admission-currently on Coumadin Polymyalgia rheumatica Anxiety/depression  Events 1/15 brought to hospital with cough congestion shortness of breath---also severe hyponatremia and hyperglycemia 300-sodium 121 BUN/creatinine 17/0.5 WBC 14 platelet 345 TSH 19 RSV was positive Urine sodium was 18 urine aOsm was 601  - procedures 1/16 CXR = chronic coarsened interstitial markings bilaterally no superb and post acute process  Plan  Sepsis secondary to RSV, MSSA and stenotrophomonas pneumonia superimposed on interstitial lung disease Completed >10 days antibiotics 1/27  Outpatient follow-up with pulmonology--- complete Taper of prednisone back to baseline prednisone 5 mg when patient is d/c Continue albuterol q4 as needed Brovana 15 twice daily Pulmicort 0.5 twice daily as well as Mucinex as needed and Yupelri 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 Continues on Coumadin dosing per pharmacy-INR therapeutic --probable lifelong AC Paroxysmal A-fib CHADVASC >4 Continues on metoprolol IV every 6 as needed high blood pressure-not on rate control at home Start Toprol-XL 12.5, monitor blood pressure as occasional lows--cut back flomax to 0.4 as could additively drop BP Continues Coumadin  Previous back surgery, back infections Resumed on Lyrica 75 twice daily, Zanaflex 6 mg every 4 hours Oxy IR 5 every 4  +Tylenol 650 Q4 additionally Severe hyponatremia/SIADH on admission resolved at this  time continue regular diet HFpEF-EF 60-65% grade 1 DD no valvular abnormality See above regarding metoprolol-does not appear volume overloaded BPH Continues on Flomax 0.4 nightly-outpatient follow-up Voiding without catherter now Depression Continue BuSpar 15 3 times daily, Effexor 150 AM, zolpidem 5 at bedtime for sleep DM TY 2 CBGs 160-260,  eating 100% of meals  on short acting, increased long-acting 18-->25 units-- 4 -->6units 3 times daily meals and sliding scale sugars will improve with de-escalation of steroids   DVT prophylaxis: On Coumadin  Status is: Inpatient Remains inpatient appropriate because: Requires in hospital management but likely can go to rehab whenever able   Subjective:  Feels fair no distress No cp No fever Moving around some    Objective + exam Vitals:   08/19/23 0500 08/19/23 0555 08/19/23 1019 08/19/23 1257  BP:  (!) 153/75 112/68 (!) 155/87  Pulse:  82 83 84  Resp:  18  20  Temp:  97.9 F (36.6 C)  97.7 F (36.5 C)  TempSrc:  Oral  Oral  SpO2:  98%  100%  Weight: 84.6 kg     Height:       Filed Weights   08/16/23 0500 08/18/23 0500 08/19/23 0500  Weight: 85.3 kg 89.8 kg 84.6 kg    Examination:  Coherent pleasant EOMI NCAT no focal deficit No icterus no pallor Clear chest no wheeze S1-S2 no murmur Abdomen soft No lower extremity edema   Data Reviewed: reviewed   CBC    Component Value Date/Time   WBC 7.8 08/19/2023 0712   RBC 3.98 (L) 08/19/2023 0712   HGB 11.5 (L) 08/19/2023 0712   HGB 11.9 (L) 10/05/2021 1429   HGB 12.8 (L) 11/18/2020 1608   HCT 36.2 (L)  08/19/2023 0712   PLT 452 (H) 08/19/2023 0712   PLT 322 10/05/2021 1429   MCV 91.0 08/19/2023 0712   MCH 28.9 08/19/2023 0712   MCHC 31.8 08/19/2023 0712   RDW 15.4 08/19/2023 0712   LYMPHSABS 0.2 (L) 08/19/2023 0712   MONOABS 0.6 08/19/2023 0712   EOSABS 0.0 08/19/2023 0712   BASOSABS 0.0 08/19/2023 0712      Latest Ref Rng & Units 08/19/2023    7:12 AM  08/18/2023    6:03 AM 08/17/2023    3:28 AM  CMP  Glucose 70 - 99 mg/dL 086  578  469   BUN 8 - 23 mg/dL 8  9  14    Creatinine 0.61 - 1.24 mg/dL 6.29  5.28  4.13   Sodium 135 - 145 mmol/L 135  129  131   Potassium 3.5 - 5.1 mmol/L 3.9  3.4  3.5   Chloride 98 - 111 mmol/L 98  96  98   CO2 22 - 32 mmol/L 30  28  26    Calcium 8.9 - 10.3 mg/dL 8.6  7.7  8.1     Scheduled Meds:  acetaminophen  650 mg Oral Q4H   arformoterol  15 mcg Nebulization BID   budesonide (PULMICORT) nebulizer solution  0.5 mg Nebulization BID   busPIRone  15 mg Oral TID   Chlorhexidine Gluconate Cloth  6 each Topical Daily   dextromethorphan-guaiFENesin  2 tablet Oral BID   insulin aspart  0-20 Units Subcutaneous TID WC   insulin aspart  0-5 Units Subcutaneous QHS   insulin aspart  6 Units Subcutaneous TID WC   insulin glargine-yfgn  25 Units Subcutaneous Daily   metoprolol succinate  12.5 mg Oral Daily   oxyCODONE  5 mg Oral Q4H   predniSONE  30 mg Oral Q breakfast   Followed by   Melene Muller ON 08/21/2023] predniSONE  20 mg Oral Q breakfast   Followed by   Melene Muller ON 08/24/2023] predniSONE  10 mg Oral Q breakfast   Followed by   Melene Muller ON 08/27/2023] predniSONE  5 mg Oral Q breakfast   pregabalin  75 mg Oral BID   revefenacin  175 mcg Nebulization Daily   tamsulosin  0.4 mg Oral QPM   tiZANidine  6 mg Oral Q4H   venlafaxine XR  150 mg Oral Q breakfast   warfarin  7.5 mg Oral ONCE-1600   Warfarin - Pharmacist Dosing Inpatient   Does not apply q1600   zolpidem  5 mg Oral QHS   Continuous Infusions:  Time  5  Rhetta Mura, MD  [facility]

## 2023-08-20 ENCOUNTER — Encounter: Payer: Self-pay | Admitting: Internal Medicine

## 2023-08-20 ENCOUNTER — Encounter: Payer: Self-pay | Admitting: Family Medicine

## 2023-08-20 DIAGNOSIS — J189 Pneumonia, unspecified organism: Secondary | ICD-10-CM | POA: Diagnosis not present

## 2023-08-20 LAB — GLUCOSE, CAPILLARY: Glucose-Capillary: 130 mg/dL — ABNORMAL HIGH (ref 70–99)

## 2023-08-20 LAB — PROTIME-INR
INR: 2 — ABNORMAL HIGH (ref 0.8–1.2)
Prothrombin Time: 22.5 s — ABNORMAL HIGH (ref 11.4–15.2)

## 2023-08-20 MED ORDER — TAMSULOSIN HCL 0.4 MG PO CAPS: 0.4000 mg | ORAL_CAPSULE | Freq: Every evening | ORAL | 0 refills | Status: DC

## 2023-08-20 MED ORDER — INSULIN ASPART 100 UNIT/ML IJ SOLN: 6.0000 [IU] | Freq: Three times a day (TID) | INTRAMUSCULAR | 11 refills | Status: DC

## 2023-08-20 MED ORDER — REVEFENACIN 175 MCG/3ML IN SOLN: 175.0000 ug | Freq: Every day | RESPIRATORY_TRACT | 1 refills | Status: DC

## 2023-08-20 MED ORDER — ARFORMOTEROL TARTRATE 15 MCG/2ML IN NEBU: 15.0000 ug | INHALATION_SOLUTION | Freq: Two times a day (BID) | RESPIRATORY_TRACT | 1 refills | Status: DC

## 2023-08-20 MED ORDER — METOPROLOL SUCCINATE ER 25 MG PO TB24: 12.5000 mg | ORAL_TABLET | Freq: Every day | ORAL | 2 refills | Status: DC

## 2023-08-20 MED ORDER — PREDNISONE 5 MG PO TABS: ORAL_TABLET | ORAL | 0 refills | Status: DC

## 2023-08-20 MED ORDER — ACETAMINOPHEN 325 MG PO TABS: 650.0000 mg | ORAL_TABLET | ORAL | Status: DC

## 2023-08-20 MED ORDER — BUDESONIDE 0.5 MG/2ML IN SUSP: 0.5000 mg | Freq: Two times a day (BID) | RESPIRATORY_TRACT | 0 refills | Status: DC

## 2023-08-20 MED ORDER — OXYCODONE-ACETAMINOPHEN 5-325 MG PO TABS: 1.0000 | ORAL_TABLET | ORAL | 0 refills | Status: DC | PRN

## 2023-08-20 MED ORDER — COMPRESSOR/NEBULIZER MISC: 1.0000 [IU] | Freq: Every day | 0 refills | Status: DC | PRN

## 2023-08-20 NOTE — Plan of Care (Signed)
  Problem: Education: Goal: Ability to describe self-care measures that may prevent or decrease complications (Diabetes Survival Skills Education) will improve Outcome: Adequate for Discharge Goal: Individualized Educational Video(s) Outcome: Adequate for Discharge   Problem: Coping: Goal: Ability to adjust to condition or change in health will improve Outcome: Adequate for Discharge   Problem: Fluid Volume: Goal: Ability to maintain a balanced intake and output will improve Outcome: Adequate for Discharge   Problem: Health Behavior/Discharge Planning: Goal: Ability to identify and utilize available resources and services will improve Outcome: Adequate for Discharge Goal: Ability to manage health-related needs will improve Outcome: Adequate for Discharge   Problem: Metabolic: Goal: Ability to maintain appropriate glucose levels will improve Outcome: Adequate for Discharge   Problem: Nutritional: Goal: Maintenance of adequate nutrition will improve Outcome: Adequate for Discharge Goal: Progress toward achieving an optimal weight will improve Outcome: Adequate for Discharge   Problem: Skin Integrity: Goal: Risk for impaired skin integrity will decrease Outcome: Adequate for Discharge   Problem: Tissue Perfusion: Goal: Adequacy of tissue perfusion will improve Outcome: Adequate for Discharge   Problem: Education: Goal: Knowledge of General Education information will improve Description: Including pain rating scale, medication(s)/side effects and non-pharmacologic comfort measures Outcome: Adequate for Discharge   Problem: Health Behavior/Discharge Planning: Goal: Ability to manage health-related needs will improve Outcome: Adequate for Discharge   Problem: Clinical Measurements: Goal: Will remain free from infection Outcome: Adequate for Discharge Goal: Diagnostic test results will improve Outcome: Adequate for Discharge Goal: Respiratory complications will  improve Outcome: Adequate for Discharge Goal: Cardiovascular complication will be avoided Outcome: Adequate for Discharge   Problem: Activity: Goal: Risk for activity intolerance will decrease Outcome: Adequate for Discharge   Problem: Nutrition: Goal: Adequate nutrition will be maintained Outcome: Adequate for Discharge   Problem: Coping: Goal: Level of anxiety will decrease Outcome: Adequate for Discharge   Problem: Elimination: Goal: Will not experience complications related to bowel motility Outcome: Adequate for Discharge Goal: Will not experience complications related to urinary retention Outcome: Adequate for Discharge   Problem: Pain Managment: Goal: General experience of comfort will improve and/or be controlled Outcome: Adequate for Discharge   Problem: Safety: Goal: Ability to remain free from injury will improve Outcome: Adequate for Discharge   Problem: Skin Integrity: Goal: Risk for impaired skin integrity will decrease Outcome: Adequate for Discharge   Problem: Education: Goal: Knowledge of disease or condition will improve Outcome: Adequate for Discharge Goal: Knowledge of the prescribed therapeutic regimen will improve Outcome: Adequate for Discharge Goal: Individualized Educational Video(s) Outcome: Adequate for Discharge   Problem: Activity: Goal: Ability to tolerate increased activity will improve Outcome: Adequate for Discharge Goal: Will verbalize the importance of balancing activity with adequate rest periods Outcome: Adequate for Discharge   Problem: Respiratory: Goal: Ability to maintain a clear airway will improve Outcome: Adequate for Discharge Goal: Levels of oxygenation will improve Outcome: Adequate for Discharge Goal: Ability to maintain adequate ventilation will improve Outcome: Adequate for Discharge

## 2023-08-20 NOTE — Discharge Summary (Signed)
Physician Discharge Summary  Luis Brown NWG:[id number] DOB: 1946/05/20 DOA: 08/03/2023  PCP: Bradd Canary, MD  Admit date: 08/03/2023 Discharge date: 08/20/2023  Time spent: 27 minutes  Recommendations for Outpatient Follow-up:  Requires slow taper of steroids in the outpatient setting as per orders-May require advanced therapies with pulmonology in the outpatient setting-CC Dr. Marchelle Gearing to ensure he is aware New prescriptions of DME nebulizer oxygen at discharge and changed over most of meds to nebulizations of a formoterol albuterol etc. 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  Please see below for itemized issues addressed in HOpsital- refer to other progress notes for clarity if needed  Discharge Condition: Improved  Diet recommendation: Diabetic heart healthy  Filed Weights   08/18/23 0500 08/19/23 0500 08/20/23 0500  Weight: 89.8 kg 84.6 kg 84.6 kg    History of present illness:  78 year old white male Known COPD/ILD pulmonary fibrosis chronic respiratory failure 2 L at baseline 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 previously on Dalvance-had aspiration in 11/2020 on that admission 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 on that admission-currently on Coumadin Polymyalgia rheumatica Anxiety/depression   Events 1/15 brought to hospital with cough congestion shortness of breath---also severe hyponatremia and hyperglycemia 300-sodium 121 BUN/creatinine 17/0.5 WBC 14 platelet  345 TSH 19 RSV was positive Urine sodium was 18 urine aOsm was 601   - procedures 1/16 CXR = chronic coarsened interstitial markings bilaterally no superb and post acute process   Plan   Sepsis secondary to RSV, MSSA and stenotrophomonas pneumonia superimposed on interstitial lung disease Completed >10 days antibiotics 1/27  Outpatient follow-up with pulmonology--- complete Taper of prednisone back to baseline prednisone 5 mg when patient is d/c-short taper has been placed and patient is aware of the same I have CCed his pulmonologist who also has an interest in ILD and hopefully patient can get advanced therapies in the outpatient setting in about 1 to 2 weeks He has a PCP appointment within a week and should follow-up with labs for the same Continue albuterol q4 as needed Brovana 15 twice daily Pulmicort 0.5 twice daily as well as Mucinex as needed and Yupelri 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 Continues on Coumadin dosing per pharmacy-INR therapeutic --probable lifelong AC Paroxysmal A-fib CHADVASC >4 Was on IV metoprolol-this hospitalization we did Start Toprol-XL 12.5, monitor blood pressure as occasional lows--cut back flomax to 0.4 as could additively drop BP and blood pressures have improved Continues Coumadin --- predominantly therapeutic during hospital stay Previous back surgery, back infections Resumed on Lyrica 75 twice daily, Zanaflex 6 mg every 4 hours During hospital stay because of worsening pain and immobility he was changed to Oxy IR and Tylenol which would offer around-the-clock pain control however he is better more mobile today and we have switched him to Percocet at his usual home dose I have given him a limited prescription and he should follow-up in the outpatient setting Severe hyponatremia/SIADH on admission resolved at this time continue regular diet HFpEF-EF 60-65% grade 1 DD no valvular abnormality See above regarding metoprolol-does not appear volume  overloaded Outpatient  discussion about needs for diuretics BPH Continues on Flomax 0.4 nightly-outpatient follow-up Had Foley catheter during hospital stay-we cut back his dose of Flomax because he was hypotensive-this is resolved Depression Continue BuSpar 15 3 times daily, Effexor 150 AM, zolpidem 5 at bedtime for sleep DM TY 2 CBG ranging mildly from 130s to 300s on short acting, increased long-acting 18--> home dose of 26 units-at discharge Can continue home sliding scale Expect sugar control will be improved at time of discharge   Discharge Exam: Vitals:   08/20/23 0534 08/20/23 0824  BP: 105/60   Pulse: 84   Resp: 18   Temp: (!) 97.3 F (36.3 C)   SpO2: 100% 99%    Subj on day of d/c   Coherent no distress looks well feels fair no chest pain no chills no rigor  General Exam on discharge  EOMI NCAT no focal deficit no icterus no pallor Breathing fairly well on 2 L oxygen No chest pain Abdomen soft no rebound No lower extremity edema S1-S2 no murmur-predominantly in sinus rhythm with some PVCs Neuro intact  Discharge Instructions   Discharge Instructions     Diet - low sodium heart healthy   Complete by: As directed    Discharge instructions   Complete by: As directed    You should complete the course of steroids as has been prescribed for you for an extended taper and follow-up with Dr. Marchelle Gearing in the outpatient setting with regards to the same and I expect that you will need less and less insulin as the dose of the steroids drops when you are discharged You may require nebulizer going forward to help with your wheezing etc. and I would continue this also going forward It would be a good idea to continue the cough medication You were given a medication called metoprolol which will control your fast heart rate make sure that we are able to check your blood pressure once or twice daily as this medication can drop your blood pressure little low At your request  I have replaced you back on your usual Percocet which you can continue every 4 hours for pain   Increase activity slowly   Complete by: As directed    No wound care   Complete by: As directed       Allergies as of 08/20/2023       Reactions   Daptomycin    Drug induced pneumonia    Diltiazem Other (See Comments)   Passed out   Ofev [nintedanib] Diarrhea   fatigue   Pirfenidone Diarrhea   fatigue        Medication List     STOP taking these medications    meclizine 12.5 MG tablet Commonly known as: ANTIVERT   methylPREDNISolone 4 MG Tbpk tablet Commonly known as: MEDROL DOSEPAK   pantoprazole 40 MG tablet Commonly known as: PROTONIX   Pirfenidone 267 MG Tabs   Trelegy Ellipta 200-62.5-25 MCG/ACT Aepb Generic drug: Fluticasone-Umeclidin-Vilant       TAKE these medications    acetaminophen 325 MG tablet Commonly known as: TYLENOL Take 2 tablets (650 mg total) by mouth every 4 (four) hours.   albuterol 108 (90 Base) MCG/ACT inhaler Commonly known as: Proventil HFA TAKE 2 PUFFS BY MOUTH EVERY 6 HOURS AS NEEDED FOR WHEEZE OR SHORTNESS OF BREATH   arformoterol 15 MCG/2ML Nebu Commonly known as: BROVANA Take 2 mLs (15 mcg total) by nebulization 2 (two) times daily.   B-D UF III MINI PEN NEEDLES  31G X 5 MM Misc Generic drug: Insulin Pen Needle USE WITH LANTUS   Insulin Pen Needle 32G X 4 MM Misc Use with insulin   budesonide 0.5 MG/2ML nebulizer solution Commonly known as: PULMICORT Take 2 mLs (0.5 mg total) by nebulization 2 (two) times daily.   busPIRone 15 MG tablet Commonly known as: BUSPAR Take 1 tablet (15 mg total) by mouth 3 (three) times daily. What changed:  when to take this reasons to take this   Compressor/Nebulizer Misc 1 Units by Does not apply route daily as needed.   Contour Next Test test strip Generic drug: glucose blood Check blood sugar once daily   dextromethorphan-guaiFENesin 30-600 MG 12hr tablet Commonly known as:  MUCINEX DM Take 1 tablet by mouth 2 (two) times daily.   fluticasone 50 MCG/ACT nasal spray Commonly known as: FLONASE SPRAY 2 SPRAYS INTO EACH NOSTRIL EVERY DAY   insulin aspart 100 UNIT/ML injection Commonly known as: novoLOG Inject 6 Units into the skin 3 (three) times daily with meals.   insulin lispro 100 UNIT/ML injection Commonly known as: HumaLOG Inject 0.03 mLs (3 Units total) into the skin 3 (three) times daily with meals. What changed:  when to take this reasons to take this   Insulin Syringe-Needle U-100 30G X 1/2" 1 ML Misc Commonly known as: B-D INS SYR ULTRAFINE 1CC/30G USE WITH LANTUS   Insulin Syringes (Disposable) U-100 1 ML Misc USE WITH LANTUS   Lantus SoloStar 100 UNIT/ML Solostar Pen Generic drug: insulin glargine Inject 26 Units into the skin daily.   metoprolol succinate 25 MG 24 hr tablet Commonly known as: TOPROL-XL Take 0.5 tablets (12.5 mg total) by mouth daily.   Mounjaro 2.5 MG/0.5ML Pen Generic drug: tirzepatide INJECT 2.5 MG SUBCUTANEOUSLY WEEKLY What changed: See the new instructions.   oxyCODONE-acetaminophen 5-325 MG tablet Commonly known as: PERCOCET/ROXICET Take 1 tablet by mouth every 4 (four) hours as needed for pain- frequency change   predniSONE 5 MG tablet Commonly known as: DELTASONE Take 4 tablets (20 mg total) by mouth daily with breakfast for 3 days, THEN 2 tablets (10 mg total) daily with breakfast for 10 days, THEN 1 tablet (5 mg total) daily with breakfast. Start taking on: August 21, 2023 What changed: See the new instructions.   pregabalin 75 MG capsule Commonly known as: LYRICA TAKE 1 CAPSULE BY MOUTH TWICE A DAY   revefenacin 175 MCG/3ML nebulizer solution Commonly known as: YUPELRI Take 3 mLs (175 mcg total) by nebulization daily. Start taking on: August 21, 2023   sitaGLIPtin 50 MG tablet Commonly known as: Januvia Take 1 tablet (50 mg total) by mouth daily.   tamsulosin 0.4 MG Caps capsule Commonly  known as: FLOMAX Take 1 capsule (0.4 mg total) by mouth every evening. What changed:  how much to take when to take this   tiZANidine 4 MG tablet Commonly known as: ZANAFLEX Take 1 and 1/2 tablets (6 mg total) by mouth every 4 hours. What changed:  how much to take when to take this reasons to take this   venlafaxine XR 150 MG 24 hr capsule Commonly known as: EFFEXOR-XR Take 1 capsule (150 mg total) by mouth daily with breakfast.   VITAMIN B-12 PO Take by mouth.   warfarin 5 MG tablet Commonly known as: COUMADIN Take as directed. If you are unsure how to take this medication, talk to your nurse or doctor. Original instructions: TAKE 1 TO 2 TABLETS DAILY OR AS PRESCRIBED BY COUMADIN CLINIC What changed:  See the new instructions.   zolpidem 10 MG tablet Commonly known as: AMBIEN Take 0.5-1 tablets (5-10 mg total) by mouth at bedtime as needed for sleep. What changed:  how much to take when to take this               Durable Medical Equipment  (From admission, onward)           Start     Ordered   08/20/23 0935  DME Oxygen  Once       Question Answer Comment  Length of Need Lifetime   Mode or (Route) Nasal cannula   Liters per Minute 2   Frequency Continuous (stationary and portable oxygen unit needed)   Oxygen delivery system Gas      08/20/23 0938           Allergies  Allergen Reactions   Daptomycin     Drug induced pneumonia    Diltiazem Other (See Comments)    Passed out   Ofev [Nintedanib] Diarrhea    fatigue   Pirfenidone Diarrhea    fatigue      The results of significant diagnostics from this hospitalization (including imaging, microbiology, ancillary and laboratory) are listed below for reference.    Significant Diagnostic Studies: DG Chest 1 View Result Date: 08/12/2023 CLINICAL DATA:  Hypoxia. Cough and congestion. Worsening shortness of breath. EXAM: CHEST  1 VIEW COMPARISON:  08/07/2023 FINDINGS: Lung volumes are low.  Improving interstitial opacities, greatest residual in the right greater than left upper lobes and left lung base. No new airspace disease. Stable heart size and mediastinal contours. No pneumothorax or significant pleural effusion. Thoracolumbar fusion hardware. IMPRESSION: Improving interstitial opacities, greatest residual in the upper lung zones and left lung base. No new abnormalities. Electronically Signed   By: Narda Rutherford M.D.   On: 08/12/2023 16:50   ECHOCARDIOGRAM COMPLETE Result Date: 08/08/2023    ECHOCARDIOGRAM REPORT   Patient Name:   Luis Brown Date of Exam: 08/08/2023 Medical Rec #:  [id number]        Height:       69.0 in Accession #:    [id number]       Weight:       198.4 lb Date of Birth:  11-Apr-1946         BSA:          2.059 m Patient Age:    77 years         BP:           204/65 mmHg Patient Gender: M                HR:           104 bpm. Exam Location:  Inpatient Procedure: 2D Echo, Cardiac Doppler and Color Doppler Indications:    Acute respiratory distress  History:        Patient has prior history of Echocardiogram examinations, most                 recent 07/20/2021. COPD, Arrythmias:Tachycardia and Atrial                 Fibrillation, Signs/Symptoms:Fatigue and Shortness of Breath;                 Risk Factors:Hypertension and Dyslipidemia.  Sonographer:    Vern Claude Referring Phys: [id number] PETER E BABCOCK IMPRESSIONS  1. Left ventricular ejection fraction, by estimation, is 60 to 65%. The left ventricle has normal  function. The left ventricle has no regional wall motion abnormalities. Left ventricular diastolic parameters are consistent with Grade I diastolic dysfunction (impaired relaxation).  2. Right ventricular systolic function is normal. The right ventricular size is normal. Tricuspid regurgitation signal is inadequate for assessing PA pressure.  3. The mitral valve is normal in structure. No evidence of mitral valve regurgitation. No evidence of mitral stenosis.  4. The  aortic valve is normal in structure. Aortic valve regurgitation is not visualized. No aortic stenosis is present.  5. The inferior vena cava is normal in size with greater than 50% respiratory variability, suggesting right atrial pressure of 3 mmHg. FINDINGS  Left Ventricle: Left ventricular ejection fraction, by estimation, is 60 to 65%. The left ventricle has normal function. The left ventricle has no regional wall motion abnormalities. The left ventricular internal cavity size was normal in size. There is  no left ventricular hypertrophy. Left ventricular diastolic parameters are consistent with Grade I diastolic dysfunction (impaired relaxation). Normal left ventricular filling pressure. Right Ventricle: The right ventricular size is normal. No increase in right ventricular wall thickness. Right ventricular systolic function is normal. Tricuspid regurgitation signal is inadequate for assessing PA pressure. Left Atrium: Left atrial size was normal in size. Right Atrium: Right atrial size was normal in size. Pericardium: There is no evidence of pericardial effusion. Mitral Valve: The mitral valve is normal in structure. No evidence of mitral valve regurgitation. No evidence of mitral valve stenosis. MV peak gradient, 6.7 mmHg. The mean mitral valve gradient is 3.0 mmHg. Tricuspid Valve: The tricuspid valve is normal in structure. Tricuspid valve regurgitation is not demonstrated. No evidence of tricuspid stenosis. Aortic Valve: The aortic valve is normal in structure. Aortic valve regurgitation is not visualized. No aortic stenosis is present. Aortic valve mean gradient measures 5.0 mmHg. Aortic valve peak gradient measures 10.8 mmHg. Aortic valve area, by VTI measures 4.08 cm. Pulmonic Valve: The pulmonic valve was normal in structure. Pulmonic valve regurgitation is not visualized. No evidence of pulmonic stenosis. Aorta: The aortic root is normal in size and structure. Venous: The inferior vena cava is normal  in size with greater than 50% respiratory variability, suggesting right atrial pressure of 3 mmHg. IAS/Shunts: No atrial level shunt detected by color flow Doppler.  LEFT VENTRICLE PLAX 2D LVIDd:         4.20 cm      Diastology LVIDs:         2.80 cm      LV e' medial:    11.30 cm/s LV PW:         0.80 cm      LV E/e' medial:  7.2 LV IVS:        1.10 cm      LV e' lateral:   9.68 cm/s LVOT diam:     2.30 cm      LV E/e' lateral: 8.4 LV SV:         91 LV SV Index:   44 LVOT Area:     4.15 cm  LV Volumes (MOD) LV vol d, MOD A2C: 102.0 ml LV vol d, MOD A4C: 143.0 ml LV vol s, MOD A2C: 32.8 ml LV vol s, MOD A4C: 60.5 ml LV SV MOD A2C:     69.2 ml LV SV MOD A4C:     143.0 ml LV SV MOD BP:      77.4 ml RIGHT VENTRICLE             IVC  RV Basal diam:  3.50 cm     IVC diam: 1.30 cm RV S prime:     23.40 cm/s TAPSE (M-mode): 3.3 cm LEFT ATRIUM             Index        RIGHT ATRIUM           Index LA diam:        3.10 cm 1.51 cm/m   RA Area:     10.60 cm LA Vol (A2C):   22.6 ml 10.98 ml/m  RA Volume:   20.50 ml  9.96 ml/m LA Vol (A4C):   20.2 ml 9.81 ml/m LA Biplane Vol: 22.5 ml 10.93 ml/m  AORTIC VALVE                     PULMONIC VALVE AV Area (Vmax):    4.21 cm      PV Vmax:       1.44 m/s AV Area (Vmean):   4.00 cm      PV Peak grad:  8.2 mmHg AV Area (VTI):     4.08 cm AV Vmax:           164.00 cm/s AV Vmean:          107.000 cm/s AV VTI:            0.223 m AV Peak Grad:      10.8 mmHg AV Mean Grad:      5.0 mmHg LVOT Vmax:         166.00 cm/s LVOT Vmean:        103.000 cm/s LVOT VTI:          0.219 m LVOT/AV VTI ratio: 0.98  AORTA Ao Root diam: 3.10 cm Ao Asc diam:  3.10 cm MITRAL VALVE MV Area (PHT): 6.83 cm    SHUNTS MV Area VTI:   5.72 cm    Systemic VTI:  0.22 m MV Peak grad:  6.7 mmHg    Systemic Diam: 2.30 cm MV Mean grad:  3.0 mmHg MV Vmax:       1.29 m/s MV Vmean:      77.7 cm/s MV Decel Time: 111 msec MV E velocity: 81.50 cm/s MV A velocity: 99.90 cm/s MV E/A ratio:  0.82 Mihai Croitoru MD  Electronically signed by Thurmon Fair MD Signature Date/Time: 08/08/2023/3:40:47 PM    Final    DG Chest Port 1 View Result Date: 08/07/2023 CLINICAL DATA:  Respiratory compromise EXAM: PORTABLE CHEST 1 VIEW COMPARISON:  08/03/2023 FINDINGS: Progressive diffuse airspace infiltrate, asymmetrically more severe within the right mid lung zone, has progressed significantly since prior examination in keeping with diffuse pneumonic infiltrate or asymmetric alveolar pulmonary edema. No pneumothorax or pleural effusion. Cardiac size is within normal limits. Fractured thoracolumbar spinal fusion hardware again noted. IMPRESSION: 1. Progressive diffuse airspace infiltrate in keeping with multifocal infection and a or asymmetric alveolar pulmonary edema. Electronically Signed   By: Helyn Numbers M.D.   On: 08/07/2023 00:59   DG Chest Portable 1 View Result Date: 08/03/2023 CLINICAL DATA:  Shortness of breath EXAM: PORTABLE CHEST 1 VIEW COMPARISON:  08/02/2023 FINDINGS: Multifocal patchy opacities, right upper lobe and left lower lobe predominant, new. This appearance suggests multifocal pneumonia. No pleural effusion or pneumothorax. The heart is normal in size. Lumbar spine fixation hardware with fractured spinal fixation rods, unchanged. IMPRESSION: Multifocal pneumonia, new. Electronically Signed   By: Charline Bills M.D.   On: 08/03/2023 20:25  DG Chest 2 View Result Date: 08/02/2023 CLINICAL DATA:  Cough, shortness of breath EXAM: CHEST - 2 VIEW COMPARISON:  05/19/2023 FINDINGS: The heart size and mediastinal contours are within normal limits. Chronically coarsened interstitial markings bilaterally. No focal airspace consolidation, pleural effusion, or pneumothorax. Thoracolumbar fusion with chronically fractured rods bilaterally. Abandoned screw fragments present above the fused levels, unchanged. IMPRESSION: Chronically coarsened interstitial markings bilaterally. No superimposed acute cardiopulmonary  findings. Electronically Signed   By: Duanne Guess D.O.   On: 08/02/2023 13:36    Microbiology: No results found for this or any previous visit (from the past 240 hours).   Labs: Basic Metabolic Panel: Recent Labs  Lab 08/15/23 0309 08/17/23 0328 08/18/23 0603 08/19/23 0712  NA 132* 131* 129* 135  K 3.7 3.5 3.4* 3.9  CL 96* 98 96* 98  CO2 29 26 28 30   GLUCOSE 226* 178* 231* 143*  BUN 16 14 9 8   CREATININE 0.43* 0.61 0.59* 0.64  CALCIUM 7.7* 8.1* 7.7* 8.6*   Liver Function Tests: Recent Labs  Lab 08/15/23 0309  AST 14*  ALT 10  ALKPHOS 53  BILITOT 0.6  PROT 4.8*  ALBUMIN 2.6*   No results for input(s): "LIPASE", "AMYLASE" in the last 168 hours. No results for input(s): "AMMONIA" in the last 168 hours. CBC: Recent Labs  Lab 08/14/23 0246 08/15/23 0309 08/17/23 0328 08/18/23 0603 08/19/23 0712  WBC 6.6 7.3 6.6 5.5 7.8  NEUTROABS  --   --  5.9 4.8 6.9  HGB 11.2* 10.5* 10.7* 9.9* 11.5*  HCT 35.5* 33.3* 33.6* 31.1* 36.2*  MCV 90.1 90.5 91.3 89.6 91.0  PLT 329 342 400 420* 452*   Cardiac Enzymes: No results for input(s): "CKTOTAL", "CKMB", "CKMBINDEX", "TROPONINI" in the last 168 hours. BNP: BNP (last 3 results) No results for input(s): "BNP" in the last 8760 hours.  ProBNP (last 3 results) Recent Labs    05/31/23 1534  PROBNP 40.0    CBG: Recent Labs  Lab 08/19/23 1149 08/19/23 1436 08/19/23 1644 08/19/23 2205 08/20/23 0741  GLUCAP 323* 259* 308* 152* 130*    Signed:  Rhetta Mura MD   [facility] 08/20/2023, 9:38 AM

## 2023-08-20 NOTE — TOC Transition Note (Signed)
Transition of Care [facility]) - Discharge Note   Patient Details  Name: Luis Brown MRN: [id number] Date of Birth: Jul 28, 1945  [facility]) CM/SW Contact:  Adrian Prows, RN Phone Number: 08/20/2023, 10:04 AM   Clinical Narrative:    D/C orders received for HHPT/OT/RN, and nebulizer; spoke w/ pt and wife [name] in room; they agree to recc services/DME; they do not have an agency preference; pt has home oxygen w/ [employer]; his wife says he has a full travel tank that she bought to the hospital; LVM for Mitch at [employer] @ 1001 for nebulizer; awaiting return call; also LVM for [facility] at [place]  @ [phone] to see if agency can provide [facility] services; awaiting return call.  [phone] contacted Kandee Keen at [place] and he says agency can provide services; also spoke w/ Vaughan Basta at [place]; he says agency can provide nebulizer, and it will be delivered to pt's room; pt/wife notified; agencies' contact info placed in follow up provider section of d/c instructions; no TOC needs.   Final next level of care: Home w Home Health Services Barriers to Discharge: No Barriers Identified   Patient Goals and CMS Choice            Discharge Placement                       Discharge Plan and Services Additional resources added to the After Visit Summary for                            HH Arranged: RN, PT, OT [facility] Agency: [facility] Date [facility] Agency Contacted: 08/20/23 Time HH Agency Contacted: 1004 Representative spoke with at [facility] Agency: Kandee Keen  Social Drivers of Health (SDOH) Interventions SDOH Screenings   Food Insecurity: No Food Insecurity (08/04/2023)  Housing: Low Risk  (08/04/2023)  Transportation Needs: No Transportation Needs (08/04/2023)  Utilities: Not At Risk (08/04/2023)  Alcohol Screen: Low Risk  (01/13/2023)  Depression (PHQ2-9): Low Risk  (02/20/2023)  Financial Resource Strain: Low Risk  (01/13/2023)  Physical Activity: Inactive (01/13/2023)  Social  Connections: Socially Integrated (08/04/2023)  Stress: Stress Concern Present (01/13/2023)  Tobacco Use: Medium Risk (08/03/2023)     Readmission Risk Interventions    08/04/2023    7:08 PM  Readmission Risk Prevention Plan  Transportation Screening Complete  PCP or Specialist Appt within 3-5 Days Complete  HRI or Home Care Consult Complete  Social Work Consult for Recovery Care Planning/Counseling Complete  Palliative Care Screening Not Applicable  Medication Review Oceanographer) Complete

## 2023-08-21 ENCOUNTER — Ambulatory Visit: Payer: Medicare Other | Admitting: Nurse Practitioner

## 2023-08-21 ENCOUNTER — Telehealth: Payer: Self-pay | Admitting: *Deleted

## 2023-08-21 NOTE — Transitions of Care (Post Inpatient/ED Visit) (Signed)
   08/21/2023  Name: Luis Brown MRN: [id number] DOB: September 22, 1945  Today's TOC FU Call Status: Today's TOC FU Call Status:: Unsuccessful Call (1st Attempt) Unsuccessful Call (1st Attempt) Date: 08/21/23  Attempted to reach the patient regarding the most recent Inpatient/ED visit.  Follow Up Plan: Additional outreach attempts will be made to reach the patient to complete the Transitions of Care (Post Inpatient/ED visit) call.   Gean Maidens BSN RN [place] [facility] Health Care Management Coordinator [email or web address] Direct Dial: [phone]  Fax: [phone] Website: [email or web address]

## 2023-08-22 ENCOUNTER — Other Ambulatory Visit: Payer: Self-pay | Admitting: Family Medicine

## 2023-08-22 ENCOUNTER — Telehealth: Payer: Self-pay | Admitting: *Deleted

## 2023-08-22 NOTE — Transitions of Care (Post Inpatient/ED Visit) (Signed)
   08/22/2023  Name: Luis Brown MRN: [id number] DOB: 1945/12/05  Today's TOC FU Call Status: Today's TOC FU Call Status:: Unsuccessful Call (2nd Attempt) Unsuccessful Call (2nd Attempt) Date: 08/22/23  Attempted to reach the patient regarding the most recent Inpatient/ED visit.  Follow Up Plan: Additional outreach attempts will be made to reach the patient to complete the Transitions of Care (Post Inpatient/ED visit) call.  Cathlean Headland BSN RN [place] [facility] Health Care Management Coordinator [email or web address] Direct Dial: [phone]  Fax: [phone] Website: [email or web address]

## 2023-08-23 ENCOUNTER — Telehealth: Payer: Self-pay | Admitting: *Deleted

## 2023-08-23 NOTE — Transitions of Care (Post Inpatient/ED Visit) (Signed)
   08/23/2023  Name: Luis Brown MRN: [id number] DOB: Apr 23, 1946  Today's TOC FU Call Status: Today's TOC FU Call Status:: Unsuccessful Call (3rd Attempt) Unsuccessful Call (3rd Attempt) Date: 08/23/23  Attempted to reach the patient regarding the most recent Inpatient/ED visit.  Follow Up Plan: No further outreach attempts will be made at this time. We have been unable to contact the patient.  Cathlean Headland BSN RN [place] [facility] Health Care Management Coordinator [email or web address] Direct Dial: [phone]  Fax: [phone] Website: [email or web address]

## 2023-08-24 ENCOUNTER — Other Ambulatory Visit (HOSPITAL_BASED_OUTPATIENT_CLINIC_OR_DEPARTMENT_OTHER): Payer: Self-pay

## 2023-08-24 ENCOUNTER — Encounter: Payer: Medicare Other | Admitting: Family Medicine

## 2023-08-24 DIAGNOSIS — J440 Chronic obstructive pulmonary disease with acute lower respiratory infection: Secondary | ICD-10-CM | POA: Diagnosis not present

## 2023-08-24 DIAGNOSIS — J441 Chronic obstructive pulmonary disease with (acute) exacerbation: Secondary | ICD-10-CM | POA: Diagnosis not present

## 2023-08-24 MED ORDER — ARFORMOTEROL TARTRATE 15 MCG/2ML IN NEBU: 15.0000 ug | INHALATION_SOLUTION | Freq: Two times a day (BID) | RESPIRATORY_TRACT | 1 refills | Status: DC

## 2023-08-24 MED ORDER — REVEFENACIN 175 MCG/3ML IN SOLN: 175.0000 ug | Freq: Every day | RESPIRATORY_TRACT | 1 refills | Status: DC

## 2023-08-24 NOTE — Telephone Encounter (Signed)
 NFN

## 2023-08-25 ENCOUNTER — Encounter: Payer: Self-pay | Admitting: Internal Medicine

## 2023-08-25 DIAGNOSIS — J440 Chronic obstructive pulmonary disease with acute lower respiratory infection: Secondary | ICD-10-CM | POA: Diagnosis not present

## 2023-08-25 DIAGNOSIS — J441 Chronic obstructive pulmonary disease with (acute) exacerbation: Secondary | ICD-10-CM | POA: Diagnosis not present

## 2023-08-28 ENCOUNTER — Telehealth: Payer: Self-pay | Admitting: Cardiology

## 2023-08-28 ENCOUNTER — Ambulatory Visit: Payer: Self-pay | Admitting: Family Medicine

## 2023-08-28 DIAGNOSIS — J440 Chronic obstructive pulmonary disease with acute lower respiratory infection: Secondary | ICD-10-CM | POA: Diagnosis not present

## 2023-08-28 DIAGNOSIS — J441 Chronic obstructive pulmonary disease with (acute) exacerbation: Secondary | ICD-10-CM | POA: Diagnosis not present

## 2023-08-28 NOTE — Telephone Encounter (Addendum)
 Called [facility]: spoke with Antonio to inform of patient's ED refusal - and to request this information be passed along to patient's care team

## 2023-08-28 NOTE — Telephone Encounter (Signed)
 Wife is calling to get patient an appt asap. States that the patient has been in ICU for 10days. And know he had fluld build up, states that his feet are swollen. Please advise

## 2023-08-28 NOTE — Telephone Encounter (Addendum)
 Copied from CRM [id number]. Topic: Appointments - Scheduling Inquiry for Clinic >> Aug 28, 2023 12:52 PM Luis Brown wrote: Reason for CRM: patient fell and is in pain   Chief Complaint: worsening shortness of breath Symptoms: increased O2 from 3 L to 5 L Frequency: ongoing since yesterday after fall Disposition: [] ED /[] Urgent Care (no appt availability in office) / [x] Appointment(In office/virtual)/ []  [place]/ [] Home Care/ [] Refused Recommended Disposition /[] [place] [facility]/ []  Follow-up with PCP Additional Notes: [facility] - Martine Sleek occupational therapist.  The patient slid off his bed yesterday at 4:30 am.  He has a bruise at his left sternal clavicular area.  Yesterday the pain was 8/10.  Today the pain is between 5-6/10 and when he uses his arm it is 7/10. BP 96/54 at 1233 BP 102/52 at 1250 O2 at rest 94%  His 88% at 5 L. He is normally at 3 L but he increased his oxygen  as the pain is causing his baseline shortness of breath to be worse. HR at rest 102.  Walked from bathroom 116.  He was hospitalized for 16 days due to RSV and was discharged 08/20/23.  The OT could not hear the nurse suddenly.  The nurse could hear the patient say that he just needed to rest and he did not want to go anywhere today.  Disconnected and called right back with the same sound issue.  Called OT again with no answer.  Called patient with no answer.  Left voicemail for call back.   Recommends same day appointment.  Will schedule call back attempt.  Reason for Disposition  [1] Longstanding difficulty breathing (e.g., CHF, COPD, emphysema) AND [2] WORSE than normal  Answer Assessment - Initial Assessment Questions 1. RESPIRATORY STATUS: "Describe your breathing?" (e.g., wheezing, shortness of breath, unable to speak, severe coughing)      Shortness of breath is baseline but pain makes it worse than he recalls 2. ONSET: "When did this breathing problem begin?"      Fall yesterday 4:30  am tired sore and  achy 3. PATTERN "Does the difficult breathing come and go, or has it been constant since it started?"      Constant  4. SEVERITY: "How bad is your breathing?" (e.g., mild, moderate, severe)    - MILD: No SOB at rest, mild SOB with walking, speaks normally in sentences, can lie down, no retractions, pulse < 100.    - MODERATE: SOB at rest, SOB with minimal exertion and prefers to sit, cannot lie down flat, speaks in phrases, mild retractions, audible wheezing, pulse 100-120.    - SEVERE: Very SOB at rest, speaks in single words, struggling to breathe, sitting hunched forward, retractions, pulse > 120      Shortness of breath  5. RECURRENT SYMPTOM: "Have you had difficulty breathing before?" If Yes, ask: "When was the last time?" and "What happened that time?"      Baseline is shortness of breath  7. LUNG HISTORY: "Do you have any history of lung disease?"  (e.g., pulmonary embolus, asthma, emphysema)     COPD, pulmonary fibrosis, RSV hospitalized 16 days; Sunday discharged  8. CAUSE: "What do you think is causing the breathing problem?"      Pain is causing  9. OTHER SYMPTOMS: "Do you have any other symptoms? (e.g., dizziness, runny nose, cough, chest pain, fever)     Injured himself with fall  10. O2 SATURATION MONITOR:  "Do you use an oxygen  saturation monitor (  pulse oximeter) at home?" If Yes, ask: "What is your reading (oxygen  level) today?" "What is your usual oxygen  saturation reading?" (e.g., 95%)       With exertion 94% down to 87%  Answer Assessment - Initial Assessment Questions 1. MECHANISM: "How did the fall happen?"     Slid off bed and hit walker 3. ONSET: "When did the fall happen?" (e.g., minutes, hours, or days ago)     Yesterday 4:30 am 4. LOCATION: "What part of the body hit the ground?" (e.g., back, buttocks, head, hips, knees, hands, head, stomach)     Left clavicle sternum  5. INJURY: "Did you hurt (injure) yourself when you fell?" If Yes, ask: "What  did you injure? Tell me more about this?" (e.g., body area; type of injury; pain severity)"     Bruised and sore  6. PAIN: "Is there any pain?" If Yes, ask: "How bad is the pain?" (e.g., Scale 1-10; or mild,  moderate, severe)   - NONE (0): No pain   - MILD (1-3): Doesn't interfere with normal activities    - MODERATE (4-7): Interferes with normal activities or awakens from sleep    - SEVERE (8-10): Excruciating pain, unable to do any normal activities      esterday pain 8/10  Today pain is between 5-6/10 Uses arm is 7/10 7. SIZE: For cuts, bruises, or swelling, ask: "How large is it?" (e.g., inches or centimeters)      bruised10.  CAUSE: "What do you think caused the fall (or falling)?" (e.g., tripped, dizzy spell)  Slid off bed  Protocols used: Breathing Difficulty-A-AH, Falls and Falling-A-AH

## 2023-08-28 NOTE — Telephone Encounter (Signed)
 Called to follow up with patient and encouraged them to go to the ED. The patient is still refusing. I spoke with with the daughter and let her know we will pass this information along to the DOD and see what the provider recommends.

## 2023-08-28 NOTE — Telephone Encounter (Signed)
 Call dropped before agent could transfer. Attempted to callback: no answer and unable to leave voicemail.

## 2023-08-28 NOTE — Telephone Encounter (Signed)
 Received callback from Rexine Cater occupational therapist , Burdette Carolin RN - both from with [facility] and pt's wife on the line describing /summarizing /add clarification regarding patient and requesting to schedule an appoint for patient to be seen.  I was informed that patient experiences SOB all the times: at rest pt can speak a few words before having to stop and take a breath - usually O294-95L  today O2@ 83@5L  w/exertion - nurse stated normally patient recovers back quickly to normal stats in 90's but today he is not doing so: pt only able to speak 1 or 2 words without giving out of breath. Nurse stated patient has +2 bilateral pitting edema to lower extremities BP 108/56 HR 100.  I informed all parties that I recommend patient to go to ED: they informed my patient has refused them  earlier with ED request therefore they were hoping we/I would schedule an appointment for patient today and maybe his provider, once in office, would send him to the ED.  I informed based on my medical opinion for the safety of the patient I recommend the patient got to ED therefore I would not be scheduling an appt with provider. I also informed I would send message to provider office regarding refusal and request a possible call to patient but I could not make promises regarding if or when a call would be made.

## 2023-08-28 NOTE — Telephone Encounter (Signed)
 Spoke with patient's wife. He was in the hospital and discharged 2/2  He has been on 3L O2 - home O2 dropping in the upper mid-80s -- he cannot get in with his pulmonologist (on wait list) Saturday his feet started swelling - persistent since then -- he required diuresis in the hospital   Scheduled OV with Tacey Exon NP for 2/11 @ 3:35pm

## 2023-08-28 NOTE — Telephone Encounter (Signed)
 Chart reviewed, will address at clinic visit.   He has cancelled earlier March visit with pulmonology as requested afternoon visit only. 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 Pulmonology will need to manage his hypoxia. 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 Hyponatremia felt to be related to poor PO intake. Received IV Lasix  twice: 08/07/23 60mg  IV Lasix , 08/08/23 40mg  IV Lasix . Anticipate was needed due to IVF. His echo during admission showed normal LVEF, gr1dd. Cardiology not consulted during hospitalization.  Eriyah Fernando S Jonia Oakey, NP

## 2023-08-28 NOTE — Telephone Encounter (Signed)
 Attempted to callback: no answer and unable to leave voicemail.

## 2023-08-28 NOTE — Telephone Encounter (Signed)
 Called [facility]: spoke with Antonio to inform of patient's ED refusal - and to request this information be passed along to patient's care team   08/25/23- Patient called, states that he just missed a call. Per chart review, pt was awaiting a call back from the clinic re: ED refusal. This RN spoke with [facility], and was advised that no one called the patient yet. Pt made aware and will wait to hear back fro Dr. Rodrick Clapper

## 2023-08-28 NOTE — Telephone Encounter (Signed)
 Copied from CRM [id number]. Topic: Clinical - Red Word Triage >> Aug 28, 2023  1:58 PM Allyne Areola wrote: Red Word that prompted transfer to Nurse Triage: Patient fell yesterday while getting out of bed and is dealing with left arm pain, can't lift past waist without dealing with severe pain. He is also dealing with swelling in the  feet for a couple of days.

## 2023-08-28 NOTE — Telephone Encounter (Signed)
 I called and left message on machine with patient, called was able to speak with his wife.  She does not feel her husband is particularly worse than when he came home from the hospital a week ago though his feet are more swollen.  She notes her husband is not willing to go to the ER right now.  I reiterated that I do think the ER is the safest choice.  However if he is not willing to go we cannot force him she will watch him carefully overnight and if getting worse we will try to prevail upon him to go to the ER.  He does have an appointment with cardiology tomorrow which is great

## 2023-08-29 ENCOUNTER — Ambulatory Visit (HOSPITAL_BASED_OUTPATIENT_CLINIC_OR_DEPARTMENT_OTHER): Payer: Medicare Other | Admitting: Family

## 2023-08-29 ENCOUNTER — Inpatient Hospital Stay (HOSPITAL_COMMUNITY)
Admission: EM | Admit: 2023-08-29 | Discharge: 2023-09-16 | DRG: 196 | Disposition: E | Payer: Medicare Other | Attending: Internal Medicine | Admitting: Internal Medicine

## 2023-08-29 ENCOUNTER — Other Ambulatory Visit: Payer: Self-pay

## 2023-08-29 ENCOUNTER — Emergency Department (HOSPITAL_COMMUNITY): Payer: Medicare Other

## 2023-08-29 ENCOUNTER — Encounter (HOSPITAL_COMMUNITY): Payer: Self-pay | Admitting: *Deleted

## 2023-08-29 DIAGNOSIS — E8770 Fluid overload, unspecified: Secondary | ICD-10-CM | POA: Diagnosis present

## 2023-08-29 DIAGNOSIS — J189 Pneumonia, unspecified organism: Secondary | ICD-10-CM | POA: Diagnosis present

## 2023-08-29 DIAGNOSIS — M5134 Other intervertebral disc degeneration, thoracic region: Secondary | ICD-10-CM | POA: Diagnosis present

## 2023-08-29 DIAGNOSIS — M51369 Other intervertebral disc degeneration, lumbar region without mention of lumbar back pain or lower extremity pain: Secondary | ICD-10-CM | POA: Diagnosis present

## 2023-08-29 DIAGNOSIS — I959 Hypotension, unspecified: Secondary | ICD-10-CM | POA: Diagnosis present

## 2023-08-29 DIAGNOSIS — I480 Paroxysmal atrial fibrillation: Secondary | ICD-10-CM | POA: Diagnosis not present

## 2023-08-29 DIAGNOSIS — Z7189 Other specified counseling: Secondary | ICD-10-CM | POA: Diagnosis not present

## 2023-08-29 DIAGNOSIS — Z711 Person with feared health complaint in whom no diagnosis is made: Secondary | ICD-10-CM | POA: Diagnosis not present

## 2023-08-29 DIAGNOSIS — Z66 Do not resuscitate: Secondary | ICD-10-CM | POA: Diagnosis not present

## 2023-08-29 DIAGNOSIS — Z79899 Other long term (current) drug therapy: Secondary | ICD-10-CM | POA: Diagnosis not present

## 2023-08-29 DIAGNOSIS — Z1152 Encounter for screening for COVID-19: Secondary | ICD-10-CM | POA: Diagnosis not present

## 2023-08-29 DIAGNOSIS — J440 Chronic obstructive pulmonary disease with acute lower respiratory infection: Secondary | ICD-10-CM | POA: Diagnosis not present

## 2023-08-29 DIAGNOSIS — Z9981 Dependence on supplemental oxygen: Secondary | ICD-10-CM

## 2023-08-29 DIAGNOSIS — I499 Cardiac arrhythmia, unspecified: Secondary | ICD-10-CM | POA: Diagnosis not present

## 2023-08-29 DIAGNOSIS — Z743 Need for continuous supervision: Secondary | ICD-10-CM | POA: Diagnosis not present

## 2023-08-29 DIAGNOSIS — Z833 Family history of diabetes mellitus: Secondary | ICD-10-CM

## 2023-08-29 DIAGNOSIS — R0902 Hypoxemia: Secondary | ICD-10-CM | POA: Diagnosis not present

## 2023-08-29 DIAGNOSIS — L89312 Pressure ulcer of right buttock, stage 2: Secondary | ICD-10-CM | POA: Diagnosis not present

## 2023-08-29 DIAGNOSIS — Z881 Allergy status to other antibiotic agents status: Secondary | ICD-10-CM

## 2023-08-29 DIAGNOSIS — J441 Chronic obstructive pulmonary disease with (acute) exacerbation: Secondary | ICD-10-CM | POA: Diagnosis present

## 2023-08-29 DIAGNOSIS — I2489 Other forms of acute ischemic heart disease: Secondary | ICD-10-CM | POA: Diagnosis present

## 2023-08-29 DIAGNOSIS — J471 Bronchiectasis with (acute) exacerbation: Secondary | ICD-10-CM | POA: Diagnosis not present

## 2023-08-29 DIAGNOSIS — I5031 Acute diastolic (congestive) heart failure: Secondary | ICD-10-CM | POA: Diagnosis present

## 2023-08-29 DIAGNOSIS — M48061 Spinal stenosis, lumbar region without neurogenic claudication: Secondary | ICD-10-CM | POA: Diagnosis present

## 2023-08-29 DIAGNOSIS — J9621 Acute and chronic respiratory failure with hypoxia: Secondary | ICD-10-CM | POA: Diagnosis present

## 2023-08-29 DIAGNOSIS — I110 Hypertensive heart disease with heart failure: Secondary | ICD-10-CM | POA: Diagnosis present

## 2023-08-29 DIAGNOSIS — Z8249 Family history of ischemic heart disease and other diseases of the circulatory system: Secondary | ICD-10-CM

## 2023-08-29 DIAGNOSIS — I471 Supraventricular tachycardia, unspecified: Secondary | ICD-10-CM | POA: Diagnosis not present

## 2023-08-29 DIAGNOSIS — W19XXXA Unspecified fall, initial encounter: Secondary | ICD-10-CM | POA: Diagnosis present

## 2023-08-29 DIAGNOSIS — I10 Essential (primary) hypertension: Secondary | ICD-10-CM | POA: Diagnosis present

## 2023-08-29 DIAGNOSIS — Z794 Long term (current) use of insulin: Secondary | ICD-10-CM

## 2023-08-29 DIAGNOSIS — J810 Acute pulmonary edema: Principal | ICD-10-CM

## 2023-08-29 DIAGNOSIS — J80 Acute respiratory distress syndrome: Principal | ICD-10-CM | POA: Diagnosis present

## 2023-08-29 DIAGNOSIS — J849 Interstitial pulmonary disease, unspecified: Secondary | ICD-10-CM

## 2023-08-29 DIAGNOSIS — J984 Other disorders of lung: Secondary | ICD-10-CM | POA: Diagnosis not present

## 2023-08-29 DIAGNOSIS — E119 Type 2 diabetes mellitus without complications: Secondary | ICD-10-CM

## 2023-08-29 DIAGNOSIS — Z981 Arthrodesis status: Secondary | ICD-10-CM

## 2023-08-29 DIAGNOSIS — E878 Other disorders of electrolyte and fluid balance, not elsewhere classified: Secondary | ICD-10-CM | POA: Diagnosis not present

## 2023-08-29 DIAGNOSIS — R6889 Other general symptoms and signs: Secondary | ICD-10-CM | POA: Diagnosis not present

## 2023-08-29 DIAGNOSIS — R627 Adult failure to thrive: Secondary | ICD-10-CM | POA: Diagnosis not present

## 2023-08-29 DIAGNOSIS — I4891 Unspecified atrial fibrillation: Secondary | ICD-10-CM

## 2023-08-29 DIAGNOSIS — E1165 Type 2 diabetes mellitus with hyperglycemia: Secondary | ICD-10-CM | POA: Diagnosis present

## 2023-08-29 DIAGNOSIS — R918 Other nonspecific abnormal finding of lung field: Secondary | ICD-10-CM | POA: Diagnosis not present

## 2023-08-29 DIAGNOSIS — R140 Abdominal distension (gaseous): Secondary | ICD-10-CM | POA: Diagnosis not present

## 2023-08-29 DIAGNOSIS — K219 Gastro-esophageal reflux disease without esophagitis: Secondary | ICD-10-CM | POA: Diagnosis present

## 2023-08-29 DIAGNOSIS — E1142 Type 2 diabetes mellitus with diabetic polyneuropathy: Secondary | ICD-10-CM | POA: Diagnosis not present

## 2023-08-29 DIAGNOSIS — J811 Chronic pulmonary edema: Secondary | ICD-10-CM | POA: Diagnosis not present

## 2023-08-29 DIAGNOSIS — T380X5A Adverse effect of glucocorticoids and synthetic analogues, initial encounter: Secondary | ICD-10-CM | POA: Diagnosis present

## 2023-08-29 DIAGNOSIS — E871 Hypo-osmolality and hyponatremia: Secondary | ICD-10-CM | POA: Diagnosis present

## 2023-08-29 DIAGNOSIS — N400 Enlarged prostate without lower urinary tract symptoms: Secondary | ICD-10-CM | POA: Diagnosis present

## 2023-08-29 DIAGNOSIS — Z86718 Personal history of other venous thrombosis and embolism: Secondary | ICD-10-CM

## 2023-08-29 DIAGNOSIS — F419 Anxiety disorder, unspecified: Secondary | ICD-10-CM | POA: Diagnosis present

## 2023-08-29 DIAGNOSIS — G8929 Other chronic pain: Secondary | ICD-10-CM | POA: Diagnosis present

## 2023-08-29 DIAGNOSIS — Z888 Allergy status to other drugs, medicaments and biological substances status: Secondary | ICD-10-CM

## 2023-08-29 DIAGNOSIS — J470 Bronchiectasis with acute lower respiratory infection: Secondary | ICD-10-CM | POA: Diagnosis present

## 2023-08-29 DIAGNOSIS — R451 Restlessness and agitation: Secondary | ICD-10-CM | POA: Diagnosis present

## 2023-08-29 DIAGNOSIS — R0989 Other specified symptoms and signs involving the circulatory and respiratory systems: Secondary | ICD-10-CM | POA: Diagnosis not present

## 2023-08-29 DIAGNOSIS — E785 Hyperlipidemia, unspecified: Secondary | ICD-10-CM | POA: Diagnosis not present

## 2023-08-29 DIAGNOSIS — E663 Overweight: Secondary | ICD-10-CM | POA: Diagnosis present

## 2023-08-29 DIAGNOSIS — I5189 Other ill-defined heart diseases: Secondary | ICD-10-CM

## 2023-08-29 DIAGNOSIS — J8410 Pulmonary fibrosis, unspecified: Secondary | ICD-10-CM | POA: Diagnosis not present

## 2023-08-29 DIAGNOSIS — Z6827 Body mass index (BMI) 27.0-27.9, adult: Secondary | ICD-10-CM

## 2023-08-29 DIAGNOSIS — Z515 Encounter for palliative care: Secondary | ICD-10-CM | POA: Diagnosis not present

## 2023-08-29 DIAGNOSIS — J9601 Acute respiratory failure with hypoxia: Secondary | ICD-10-CM | POA: Diagnosis present

## 2023-08-29 DIAGNOSIS — Z83438 Family history of other disorder of lipoprotein metabolism and other lipidemia: Secondary | ICD-10-CM

## 2023-08-29 DIAGNOSIS — F32A Depression, unspecified: Secondary | ICD-10-CM | POA: Diagnosis present

## 2023-08-29 DIAGNOSIS — R0689 Other abnormalities of breathing: Secondary | ICD-10-CM | POA: Diagnosis not present

## 2023-08-29 DIAGNOSIS — Z8262 Family history of osteoporosis: Secondary | ICD-10-CM

## 2023-08-29 DIAGNOSIS — Z87891 Personal history of nicotine dependence: Secondary | ICD-10-CM

## 2023-08-29 DIAGNOSIS — R7989 Other specified abnormal findings of blood chemistry: Secondary | ICD-10-CM | POA: Diagnosis present

## 2023-08-29 DIAGNOSIS — Z860100 Personal history of colon polyps, unspecified: Secondary | ICD-10-CM

## 2023-08-29 DIAGNOSIS — Z7901 Long term (current) use of anticoagulants: Secondary | ICD-10-CM

## 2023-08-29 DIAGNOSIS — Z823 Family history of stroke: Secondary | ICD-10-CM

## 2023-08-29 DIAGNOSIS — Z825 Family history of asthma and other chronic lower respiratory diseases: Secondary | ICD-10-CM

## 2023-08-29 DIAGNOSIS — Z7984 Long term (current) use of oral hypoglycemic drugs: Secondary | ICD-10-CM

## 2023-08-29 DIAGNOSIS — R000 Tachycardia, unspecified: Secondary | ICD-10-CM | POA: Diagnosis not present

## 2023-08-29 DIAGNOSIS — Z7951 Long term (current) use of inhaled steroids: Secondary | ICD-10-CM

## 2023-08-29 DIAGNOSIS — Z7985 Long-term (current) use of injectable non-insulin antidiabetic drugs: Secondary | ICD-10-CM

## 2023-08-29 LAB — URINALYSIS, ROUTINE W REFLEX MICROSCOPIC
Bilirubin Urine: NEGATIVE
Glucose, UA: NEGATIVE mg/dL
Hgb urine dipstick: NEGATIVE
Ketones, ur: 5 mg/dL — AB
Leukocytes,Ua: NEGATIVE
Nitrite: NEGATIVE
Protein, ur: NEGATIVE mg/dL
Specific Gravity, Urine: 1.006 (ref 1.005–1.030)
pH: 6 (ref 5.0–8.0)

## 2023-08-29 LAB — BLOOD GAS, VENOUS
Acid-base deficit: 2.6 mmol/L — ABNORMAL HIGH (ref 0.0–2.0)
Bicarbonate: 25.1 mmol/L (ref 20.0–28.0)
Drawn by: 31609
O2 Saturation: 27.6 %
Patient temperature: 37
pCO2, Ven: 56 mm[Hg] (ref 44–60)
pH, Ven: 7.26 (ref 7.25–7.43)
pO2, Ven: <31 mm[Hg] — CL (ref 32–45)

## 2023-08-29 LAB — BASIC METABOLIC PANEL
Anion gap: 14 (ref 5–15)
BUN: 15 mg/dL (ref 8–23)
CO2: 25 mmol/L (ref 22–32)
Calcium: 9.1 mg/dL (ref 8.9–10.3)
Chloride: 90 mmol/L — ABNORMAL LOW (ref 98–111)
Creatinine, Ser: 0.7 mg/dL (ref 0.61–1.24)
GFR, Estimated: >60 mL/min (ref >60)
Glucose, Bld: 196 mg/dL — ABNORMAL HIGH (ref 70–99)
Potassium: 3.6 mmol/L (ref 3.5–5.1)
Sodium: 129 mmol/L — ABNORMAL LOW (ref 135–145)

## 2023-08-29 LAB — COMPREHENSIVE METABOLIC PANEL
ALT: 12 U/L (ref 0–44)
AST: 28 U/L (ref 15–41)
Albumin: 3.3 g/dL — ABNORMAL LOW (ref 3.5–5.0)
Alkaline Phosphatase: 72 U/L (ref 38–126)
Anion gap: 13 (ref 5–15)
BUN: 13 mg/dL (ref 8–23)
CO2: 22 mmol/L (ref 22–32)
Calcium: 8.6 mg/dL — ABNORMAL LOW (ref 8.9–10.3)
Chloride: 87 mmol/L — ABNORMAL LOW (ref 98–111)
Creatinine, Ser: 0.76 mg/dL (ref 0.61–1.24)
GFR, Estimated: >60 mL/min (ref >60)
Glucose, Bld: 196 mg/dL — ABNORMAL HIGH (ref 70–99)
Potassium: 3.9 mmol/L (ref 3.5–5.1)
Sodium: 122 mmol/L — ABNORMAL LOW (ref 135–145)
Total Bilirubin: 1.9 mg/dL — ABNORMAL HIGH (ref 0.0–1.2)
Total Protein: 6.2 g/dL — ABNORMAL LOW (ref 6.5–8.1)

## 2023-08-29 LAB — CBC WITH DIFFERENTIAL/PLATELET
Abs Immature Granulocytes: 0.14 10*3/uL — ABNORMAL HIGH (ref 0.00–0.07)
Basophils Absolute: 0 10*3/uL (ref 0.0–0.1)
Basophils Relative: 0 %
Eosinophils Absolute: 0.1 10*3/uL (ref 0.0–0.5)
Eosinophils Relative: 0 %
HCT: 34.7 % — ABNORMAL LOW (ref 39.0–52.0)
Hemoglobin: 11.1 g/dL — ABNORMAL LOW (ref 13.0–17.0)
Immature Granulocytes: 1 %
Lymphocytes Relative: 1 %
Lymphs Abs: 0.2 10*3/uL — ABNORMAL LOW (ref 0.7–4.0)
MCH: 28.8 pg (ref 26.0–34.0)
MCHC: 32 g/dL (ref 30.0–36.0)
MCV: 89.9 fL (ref 80.0–100.0)
Monocytes Absolute: 0.9 10*3/uL (ref 0.1–1.0)
Monocytes Relative: 5 %
Neutro Abs: 16.5 10*3/uL — ABNORMAL HIGH (ref 1.7–7.7)
Neutrophils Relative %: 93 %
Platelets: 307 10*3/uL (ref 150–400)
RBC: 3.86 MIL/uL — ABNORMAL LOW (ref 4.22–5.81)
RDW: 16.2 % — ABNORMAL HIGH (ref 11.5–15.5)
WBC: 17.9 10*3/uL — ABNORMAL HIGH (ref 4.0–10.5)
nRBC: 0 % (ref 0.0–0.2)

## 2023-08-29 LAB — I-STAT CHEM 8, ED
BUN: 13 mg/dL (ref 8–23)
BUN: 13 mg/dL (ref 8–23)
Calcium, Ion: 1.08 mmol/L — ABNORMAL LOW (ref 1.15–1.40)
Calcium, Ion: 1.08 mmol/L — ABNORMAL LOW (ref 1.15–1.40)
Chloride: 89 mmol/L — ABNORMAL LOW (ref 98–111)
Chloride: 88 mmol/L — ABNORMAL LOW (ref 98–111)
Creatinine, Ser: 0.7 mg/dL (ref 0.61–1.24)
Creatinine, Ser: 0.7 mg/dL (ref 0.61–1.24)
Glucose, Bld: 197 mg/dL — ABNORMAL HIGH (ref 70–99)
Glucose, Bld: 251 mg/dL — ABNORMAL HIGH (ref 70–99)
HCT: 36 % — ABNORMAL LOW (ref 39.0–52.0)
HCT: 35 % — ABNORMAL LOW (ref 39.0–52.0)
Hemoglobin: 12.2 g/dL — ABNORMAL LOW (ref 13.0–17.0)
Hemoglobin: 11.9 g/dL — ABNORMAL LOW (ref 13.0–17.0)
Potassium: 4.1 mmol/L (ref 3.5–5.1)
Potassium: 4.4 mmol/L (ref 3.5–5.1)
Sodium: 121 mmol/L — ABNORMAL LOW (ref 135–145)
Sodium: 122 mmol/L — ABNORMAL LOW (ref 135–145)
TCO2: 25 mmol/L (ref 22–32)
TCO2: 26 mmol/L (ref 22–32)

## 2023-08-29 LAB — LACTIC ACID, PLASMA: Lactic Acid, Venous: 1.5 mmol/L (ref 0.5–1.9)

## 2023-08-29 LAB — RESPIRATORY PANEL BY PCR

## 2023-08-29 LAB — CBG MONITORING, ED
Glucose-Capillary: 258 mg/dL — ABNORMAL HIGH (ref 70–99)
Glucose-Capillary: 230 mg/dL — ABNORMAL HIGH (ref 70–99)
Glucose-Capillary: 238 mg/dL — ABNORMAL HIGH (ref 70–99)

## 2023-08-29 LAB — I-STAT CG4 LACTIC ACID, ED: Lactic Acid, Venous: 2.5 mmol/L (ref 0.5–1.9)

## 2023-08-29 LAB — RESP PANEL BY RT-PCR (RSV, FLU A&B, COVID)  RVPGX2
Influenza A by PCR: NEGATIVE
Influenza B by PCR: NEGATIVE
Resp Syncytial Virus by PCR: NEGATIVE
SARS Coronavirus 2 by RT PCR: NEGATIVE

## 2023-08-29 LAB — PROCALCITONIN: Procalcitonin: 2.41 ng/mL

## 2023-08-29 LAB — GLUCOSE, CAPILLARY
Glucose-Capillary: 179 mg/dL — ABNORMAL HIGH (ref 70–99)
Glucose-Capillary: 194 mg/dL — ABNORMAL HIGH (ref 70–99)

## 2023-08-29 LAB — BRAIN NATRIURETIC PEPTIDE: B Natriuretic Peptide: 224.4 pg/mL — ABNORMAL HIGH (ref 0.0–100.0)

## 2023-08-29 LAB — TROPONIN I (HIGH SENSITIVITY)
Troponin I (High Sensitivity): 36 ng/L — ABNORMAL HIGH (ref <18)
Troponin I (High Sensitivity): 61 ng/L — ABNORMAL HIGH (ref <18)

## 2023-08-29 LAB — PROTIME-INR
INR: 2.4 — ABNORMAL HIGH (ref 0.8–1.2)
Prothrombin Time: 26.6 s — ABNORMAL HIGH (ref 11.4–15.2)

## 2023-08-29 LAB — STREP PNEUMONIAE URINARY ANTIGEN: Strep Pneumo Urinary Antigen: NEGATIVE

## 2023-08-29 LAB — MAGNESIUM: Magnesium: 2 mg/dL (ref 1.7–2.4)

## 2023-08-29 MED ORDER — PREGABALIN 75 MG PO CAPS: 75.0000 mg | ORAL_CAPSULE | Freq: Two times a day (BID) | ORAL | Status: DC

## 2023-08-29 MED ORDER — ONDANSETRON HCL 4 MG/2ML IJ SOLN: 4.0000 mg | Freq: Four times a day (QID) | INTRAMUSCULAR | Status: DC | PRN

## 2023-08-29 MED ORDER — ORAL CARE MOUTH RINSE: 15.0000 mL | OROMUCOSAL | Status: DC | PRN

## 2023-08-29 MED ORDER — HALOPERIDOL LACTATE 5 MG/ML IJ SOLN
INTRAMUSCULAR | Status: AC
Start: 2023-08-29 — End: 2023-08-29
  Administered 2023-08-29: 3 mg via INTRAVENOUS
  Filled 2023-08-29: qty 1

## 2023-08-29 MED ORDER — METOPROLOL TARTRATE 5 MG/5ML IV SOLN
2.5000 mg | Freq: Two times a day (BID) | INTRAVENOUS | Status: DC
  Administered 2023-08-29: 2.5 mg via INTRAVENOUS
  Administered 2023-08-29: 5 mg via INTRAVENOUS
  Administered 2023-08-30: 2.5 mg via INTRAVENOUS
  Filled 2023-08-29 (×2): qty 5

## 2023-08-29 MED ORDER — FENTANYL CITRATE PF 50 MCG/ML IJ SOSY
50.0000 ug | PREFILLED_SYRINGE | Freq: Once | INTRAMUSCULAR | Status: AC
  Administered 2023-08-29: 50 ug via INTRAVENOUS
  Filled 2023-08-29: qty 1

## 2023-08-29 MED ORDER — DEXMEDETOMIDINE HCL IN NACL 400 MCG/100ML IV SOLN
0.0000 ug/kg/h | INTRAVENOUS | Status: DC
  Administered 2023-08-29: 0.6 ug/kg/h via INTRAVENOUS
  Administered 2023-08-29: 0.4 ug/kg/h via INTRAVENOUS
  Administered 2023-08-30: 1.2 ug/kg/h via INTRAVENOUS
  Administered 2023-08-30: 1 ug/kg/h via INTRAVENOUS
  Filled 2023-08-29 (×4): qty 100

## 2023-08-29 MED ORDER — VANCOMYCIN HCL IN DEXTROSE 1-5 GM/200ML-% IV SOLN
1000.0000 mg | Freq: Two times a day (BID) | INTRAVENOUS | Status: DC
  Administered 2023-08-30: 1000 mg via INTRAVENOUS
  Filled 2023-08-29: qty 200

## 2023-08-29 MED ORDER — METOPROLOL TARTRATE 5 MG/5ML IV SOLN: 5.0000 mg | Freq: Once | INTRAVENOUS | Status: AC

## 2023-08-29 MED ORDER — ACETAMINOPHEN 650 MG RE SUPP: 650.0000 mg | Freq: Four times a day (QID) | RECTAL | Status: DC | PRN

## 2023-08-29 MED ORDER — ACETAMINOPHEN 325 MG PO TABS: 650.0000 mg | ORAL_TABLET | Freq: Four times a day (QID) | ORAL | Status: DC | PRN

## 2023-08-29 MED ORDER — ONDANSETRON HCL 4 MG PO TABS: 4.0000 mg | ORAL_TABLET | Freq: Four times a day (QID) | ORAL | Status: DC | PRN

## 2023-08-29 MED ORDER — CEFEPIME HCL 2 G IV SOLR
2.0000 g | Freq: Three times a day (TID) | INTRAVENOUS | Status: DC
  Administered 2023-08-29 – 2023-08-30 (×2): 2 g via INTRAVENOUS
  Filled 2023-08-29 (×2): qty 12.5

## 2023-08-29 MED ORDER — HYDROMORPHONE HCL 1 MG/ML IJ SOLN
0.5000 mg | INTRAMUSCULAR | Status: DC | PRN
  Administered 2023-08-29 – 2023-08-30 (×6): 0.5 mg via INTRAVENOUS
  Filled 2023-08-29 (×4): qty 1

## 2023-08-29 MED ORDER — BUDESONIDE 0.5 MG/2ML IN SUSP
0.5000 mg | Freq: Two times a day (BID) | RESPIRATORY_TRACT | Status: DC
Start: 2023-08-29 — End: 2023-08-30
  Administered 2023-08-29 – 2023-08-30 (×3): 0.5 mg via RESPIRATORY_TRACT
  Filled 2023-08-29 (×3): qty 2

## 2023-08-29 MED ORDER — INSULIN GLARGINE-YFGN 100 UNIT/ML ~~LOC~~ SOLN
26.0000 [IU] | Freq: Every day | SUBCUTANEOUS | Status: DC
  Administered 2023-08-29: 26 [IU] via SUBCUTANEOUS
  Filled 2023-08-29 (×2): qty 0.26

## 2023-08-29 MED ORDER — POTASSIUM CHLORIDE 10 MEQ/100ML IV SOLN
10.0000 meq | INTRAVENOUS | Status: AC
  Administered 2023-08-29 – 2023-08-30 (×4): 10 meq via INTRAVENOUS
  Filled 2023-08-29 (×4): qty 100

## 2023-08-29 MED ORDER — HALOPERIDOL LACTATE 5 MG/ML IJ SOLN: 3.0000 mg | Freq: Four times a day (QID) | INTRAMUSCULAR | Status: DC | PRN

## 2023-08-29 MED ORDER — FENTANYL CITRATE PF 50 MCG/ML IJ SOSY
25.0000 ug | PREFILLED_SYRINGE | INTRAMUSCULAR | Status: DC | PRN
  Administered 2023-08-29: 25 ug via INTRAVENOUS
  Filled 2023-08-29: qty 1

## 2023-08-29 MED ORDER — METOPROLOL TARTRATE 5 MG/5ML IV SOLN
INTRAVENOUS | Status: AC
  Administered 2023-08-29: 5 mg via INTRAVENOUS
  Filled 2023-08-29: qty 5

## 2023-08-29 MED ORDER — SODIUM CHLORIDE 0.9 % IV SOLN
2.0000 g | Freq: Once | INTRAVENOUS | Status: AC
  Administered 2023-08-29: 2 g via INTRAVENOUS
  Filled 2023-08-29: qty 12.5

## 2023-08-29 MED ORDER — METHYLPREDNISOLONE SODIUM SUCC 40 MG IJ SOLR
40.0000 mg | Freq: Two times a day (BID) | INTRAMUSCULAR | Status: DC
  Administered 2023-08-29 (×2): 40 mg via INTRAVENOUS
  Filled 2023-08-29 (×2): qty 1

## 2023-08-29 MED ORDER — VANCOMYCIN HCL IN DEXTROSE 1-5 GM/200ML-% IV SOLN
1000.0000 mg | INTRAVENOUS | Status: AC
  Administered 2023-08-29: 1000 mg via INTRAVENOUS
  Filled 2023-08-29: qty 200

## 2023-08-29 MED ORDER — ALBUMIN HUMAN 25 % IV SOLN
25.0000 g | Freq: Four times a day (QID) | INTRAVENOUS | Status: AC
  Administered 2023-08-29 – 2023-08-30 (×4): 12.5 g via INTRAVENOUS
  Filled 2023-08-29 (×3): qty 100

## 2023-08-29 MED ORDER — PREDNISONE 20 MG PO TABS: 40.0000 mg | ORAL_TABLET | Freq: Every day | ORAL | Status: DC

## 2023-08-29 MED ORDER — WARFARIN SODIUM 5 MG PO TABS
7.5000 mg | ORAL_TABLET | Freq: Once | ORAL | Status: DC
  Filled 2023-08-29: qty 1

## 2023-08-29 MED ORDER — ARFORMOTEROL TARTRATE 15 MCG/2ML IN NEBU
15.0000 ug | INHALATION_SOLUTION | Freq: Two times a day (BID) | RESPIRATORY_TRACT | Status: DC
  Administered 2023-08-29 – 2023-08-30 (×3): 15 ug via RESPIRATORY_TRACT
  Filled 2023-08-29 (×3): qty 2

## 2023-08-29 MED ORDER — ORAL CARE MOUTH RINSE: 15.0000 mL | OROMUCOSAL | Status: DC

## 2023-08-29 MED ORDER — LORAZEPAM 2 MG/ML IJ SOLN
1.0000 mg | Freq: Once | INTRAMUSCULAR | Status: AC
Start: 2023-08-29 — End: 2023-08-29
  Administered 2023-08-29: 1 mg via INTRAVENOUS
  Filled 2023-08-29: qty 1

## 2023-08-29 MED ORDER — FUROSEMIDE 10 MG/ML IJ SOLN
40.0000 mg | Freq: Once | INTRAMUSCULAR | Status: AC
  Administered 2023-08-29: 40 mg via INTRAVENOUS
  Filled 2023-08-29: qty 4

## 2023-08-29 MED ORDER — HYDROMORPHONE HCL 1 MG/ML IJ SOLN
0.7500 mg | INTRAMUSCULAR | Status: DC | PRN
  Filled 2023-08-29: qty 1

## 2023-08-29 MED ORDER — REVEFENACIN 175 MCG/3ML IN SOLN
175.0000 ug | Freq: Every day | RESPIRATORY_TRACT | Status: DC
  Administered 2023-08-30: 175 ug via RESPIRATORY_TRACT
  Filled 2023-08-29 (×2): qty 3

## 2023-08-29 MED ORDER — LORAZEPAM 2 MG/ML IJ SOLN
0.5000 mg | INTRAMUSCULAR | Status: DC | PRN
  Administered 2023-08-29 – 2023-08-30 (×3): 0.5 mg via INTRAVENOUS
  Filled 2023-08-29 (×3): qty 1

## 2023-08-29 MED ORDER — LINAGLIPTIN 5 MG PO TABS
5.0000 mg | ORAL_TABLET | Freq: Every day | ORAL | Status: DC
  Filled 2023-08-29: qty 1

## 2023-08-29 MED ORDER — METOPROLOL SUCCINATE ER 25 MG PO TB24: 12.5000 mg | ORAL_TABLET | Freq: Every day | ORAL | Status: DC

## 2023-08-29 MED ORDER — TAMSULOSIN HCL 0.4 MG PO CAPS: 0.4000 mg | ORAL_CAPSULE | Freq: Every evening | ORAL | Status: DC

## 2023-08-29 MED ORDER — VENLAFAXINE HCL ER 150 MG PO CP24: 150.0000 mg | ORAL_CAPSULE | Freq: Every day | ORAL | Status: DC

## 2023-08-29 MED ORDER — VANCOMYCIN HCL IN DEXTROSE 1-5 GM/200ML-% IV SOLN
1000.0000 mg | Freq: Once | INTRAVENOUS | Status: AC
  Administered 2023-08-29: 1000 mg via INTRAVENOUS
  Filled 2023-08-29: qty 200

## 2023-08-29 MED ORDER — INSULIN ASPART 100 UNIT/ML IJ SOLN
0.0000 [IU] | INTRAMUSCULAR | Status: DC
  Administered 2023-08-29: 8 [IU] via SUBCUTANEOUS
  Administered 2023-08-29 (×2): 5 [IU] via SUBCUTANEOUS
  Administered 2023-08-30 (×2): 3 [IU] via SUBCUTANEOUS
  Filled 2023-08-29: qty 0.15

## 2023-08-29 MED ORDER — HALOPERIDOL LACTATE 5 MG/ML IJ SOLN
2.0000 mg | Freq: Four times a day (QID) | INTRAMUSCULAR | Status: AC | PRN
  Administered 2023-08-29 – 2023-08-30 (×2): 2 mg via INTRAVENOUS
  Filled 2023-08-29 (×2): qty 1

## 2023-08-29 MED ORDER — SODIUM CHLORIDE 0.9 % IV SOLN
500.0000 mg | INTRAVENOUS | Status: DC
  Administered 2023-08-29: 500 mg via INTRAVENOUS
  Filled 2023-08-29 (×2): qty 5

## 2023-08-29 MED ORDER — WARFARIN - PHARMACIST DOSING INPATIENT: Freq: Every day | Status: DC

## 2023-08-29 MED ORDER — CHLORHEXIDINE GLUCONATE CLOTH 2 % EX PADS
6.0000 | MEDICATED_PAD | Freq: Every day | CUTANEOUS | Status: DC
  Administered 2023-08-29: 6 via TOPICAL

## 2023-08-29 MED ORDER — FUROSEMIDE 10 MG/ML IJ SOLN
60.0000 mg | Freq: Three times a day (TID) | INTRAMUSCULAR | Status: DC
  Administered 2023-08-29 (×2): 60 mg via INTRAVENOUS
  Filled 2023-08-29: qty 8
  Filled 2023-08-29: qty 6

## 2023-08-29 NOTE — Consult Note (Signed)
Consultation Note Date: 08/29/2023   Patient Name: Luis Brown  DOB: July 15, 1946  MRN: [id number]  Age / Sex: 78 y.o., male   PCP: Bradd Canary, MD Referring Physician: Bobette Mo, MD  Reason for Consultation: Establishing goals of care     Chief Complaint/History of Present Illness:   Patient is an 78 year old male with past medical history of multiple lung issues including RSV/MSSA/pneumonia in the acute setting on chronic ILD, chronic back pain, diabetes mellitus type 2, hypertension, GERD, epidural abscess, and peripheral neuropathy who was brought to the ED on 08/29/2023 for worsening shortness of breath.  Patient had just recently had 17-day admission for acute respiratory failure in the setting of ILD and bronchiectasis exacerbation.  Since admission, patient has been requiring BiPAP support for work of breathing.  Pulmonology consulted for recommendations.  Palliative medicine team consulted to assist with complex medical decision making.  Extensive review of EMR prior to presenting to bedside.  Also discussed care with pulmonologist.  Presented to bedside in ER to see patient.  Patient laying in bed sleepy on BiPAP.  Patient's wife and pastor present at bedside.  With permission, introduced myself as a member of the palliative medicine team and my role in patient's care.  Spent time learning about patient's worsening lung disease and symptom burden.  Wife noted plan for patient to be admitted at this time for management though hopeful patient will eventually be able to return back home with support.    Wife and patient had wanted to discuss what support could potentially look like at home for them.  Spent time learning about specifics regarding goals for medical care at home.  Wife noted she and patient had discussed that he no longer wants to pursue coming back to the hospital or physical therapy even with home health.  With permission able to discuss similarities and  differences between palliative medicine and hospice support at home.  Provided generalities regarding hospice care and what they would and would not provide.  Did discuss that primary caregiving would still fall to family.  Wife noted appreciation for information so they can consider this.  Still wanting admission at this time for optimization prior to this dieting plans to go home with support.  Discussed would also adjust medications for symptom management including shortness of breath.  Discussed use of opioids for management of shortness of breath.  Also discussed that shortness of breath can lead to anxiety and though 1 can have a trigger anxiety, is likely caused by symptoms of shortness of breath.  Patient and wife agreeing with use of opioids as needed to help with dyspnea management.  Spent time providing emotional support reactive listening.  All questions answered at that time.  Noted palliative medicine team will continue following with patient's medical journey.  Updated IDT regarding discussion.  Primary Diagnoses  Present on Admission:  Acute respiratory failure with hypoxia (HCC)  Atrial fibrillation (HCC)  COPD with acute exacerbation (HCC)  Diabetes mellitus with hyperglycemia (HCC)  Essential hypertension  GERD (gastroesophageal reflux disease)  Hyperlipidemia  Paroxysmal atrial fibrillation (HCC)   Past Medical History:  Diagnosis Date   Acute pharyngitis 10/21/2013   Allergy    grass and pollen   Anxiety and depression 10/25/2011   BPH (benign prostatic hyperplasia) 04/23/2012   Chicken pox as a child   DDD (degenerative disc disease)    cervical responds to steroid injections and low back required surgery   DDD (degenerative disc disease), lumbosacral  Diabetes mellitus    pre   Dyspnea    ED (erectile dysfunction) 04/23/2012   Elevated BP    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   Esophageal reflux 02/10/2015   Fatigue    HTN (hypertension)    Hyperglycemia    preDM     Hyperlipidemia    Insomnia    Low back pain radiating to both legs 01/16/2017   Measles as a child   Overweight(278.02)    Peripheral neuropathy 08/07/2019   Personal history of colonic polyps 10/27/2012   Follows with [facility]   Pneumonia    " walking"   Preventative health care    Testosterone deficiency 05/23/2012   Wears glasses    Social History   Socioeconomic History   Marital status: Married    Spouse name: Not on file   Number of children: Not on file   Years of education: Not on file   Highest education level: 12th grade  Occupational History   Not on file  Tobacco Use   Smoking status: Former    Current packs/day: 0.00    Average packs/day: 1 pack/day for 20.0 years (20.0 ttl pk-yrs)    Types: Cigarettes    Start date: 07/18/1969    Quit date: 07/18/1989    Years since quitting: 34.1   Smokeless tobacco: Never   Tobacco comments:    Former smoker 03/16/22  Vaping Use   Vaping status: Never Used  Substance and Sexual Activity   Alcohol use: Not Currently    Comment: rarely   Drug use: No   Sexual activity: Yes    Comment: lives with wife, still working, no dietary restrictions, continues to exercise intermittently  Other Topics Concern   Not on file  Social History Narrative   Not on file   Social Drivers of Health   Financial Resource Strain: Low Risk  (01/13/2023)   Overall Financial Resource Strain (CARDIA)    Difficulty of Paying Living Expenses: Not hard at all  Food Insecurity: No Food Insecurity (08/04/2023)   Hunger Vital Sign    Worried About Running Out of Food in the Last Year: Never true    Ran Out of Food in the Last Year: Never true  Transportation Needs: No Transportation Needs (08/04/2023)   PRAPARE - Administrator, Civil Service (Medical): No    Lack of Transportation (Non-Medical): No  Physical Activity: Inactive (01/13/2023)   Exercise Vital Sign    Days of Exercise per Week: 0 days    Minutes of Exercise per  Session: 0 min  Stress: Stress Concern Present (01/13/2023)   [employer] of Occupational Health - Occupational Stress Questionnaire    Feeling of Stress : To some extent  Social Connections: Socially Integrated (08/04/2023)   Social Connection and Isolation Panel [NHANES]    Frequency of Communication with Friends and Family: Three times a week    Frequency of Social Gatherings with Friends and Family: Once a week    Attends Religious Services: More than 4 times per year    Active Member of [employer] or Organizations: Yes    Attends Engineer, structural: More than 4 times per year    Marital Status: Married   Family History  Problem Relation Age of Onset   Hypertension Mother    Diabetes Mother        type 2   Cancer Mother 20       breast in remission   Emphysema Father  smoker   COPD Father        smoker   Stroke Father 31       mini   Heart disease Father    Hypertension Sister    Hyperlipidemia Sister    Scoliosis Sister    Osteoporosis Sister    Hypertension Maternal Grandmother    Scoliosis Maternal Grandmother    Heart disease Maternal Grandfather    Heart disease Paternal Grandfather        smoker   Heart disease Daughter    Scheduled Meds:  arformoterol  15 mcg Nebulization BID   budesonide  0.5 mg Nebulization BID   insulin aspart  0-15 Units Subcutaneous Q4H   methylPREDNISolone (SOLU-MEDROL) injection  40 mg Intravenous Q12H   revefenacin  175 mcg Nebulization Daily   Continuous Infusions:  azithromycin 500 mg (08/29/23 0933)   dexmedetomidine (PRECEDEX) IV infusion     PRN Meds:.acetaminophen **OR** acetaminophen, ondansetron **OR** ondansetron (ZOFRAN) IV Allergies  Allergen Reactions   Daptomycin     Drug induced pneumonia    Diltiazem Other (See Comments)    Passed out   Ofev [Nintedanib] Diarrhea    fatigue   Pirfenidone Diarrhea    fatigue   CBC:    Component Value Date/Time   WBC 17.9 (H) 08/29/2023 0511   HGB 11.9  (L) 08/29/2023 0838   HGB 11.9 (L) 10/05/2021 1429   HGB 12.8 (L) 11/18/2020 1608   HCT 35.0 (L) 08/29/2023 0838   PLT 307 08/29/2023 0511   PLT 322 10/05/2021 1429   MCV 89.9 08/29/2023 0511   NEUTROABS 16.5 (H) 08/29/2023 0511   LYMPHSABS 0.2 (L) 08/29/2023 0511   MONOABS 0.9 08/29/2023 0511   EOSABS 0.1 08/29/2023 0511   BASOSABS 0.0 08/29/2023 0511   Comprehensive Metabolic Panel:    Component Value Date/Time   NA 122 (L) 08/29/2023 0838   NA 134 01/25/2022 1416   K 4.4 08/29/2023 0838   CL 88 (L) 08/29/2023 0838   CO2 22 08/29/2023 0511   BUN 13 08/29/2023 0838   BUN 10 01/25/2022 1416   CREATININE 0.70 08/29/2023 0838   CREATININE 0.62 (L) 03/24/2023 1502   GLUCOSE 251 (H) 08/29/2023 0838   CALCIUM 8.6 (L) 08/29/2023 0511   AST 28 08/29/2023 0511   AST 19 10/05/2021 1429   ALT 12 08/29/2023 0511   ALT 13 10/05/2021 1429   ALKPHOS 72 08/29/2023 0511   BILITOT 1.9 (H) 08/29/2023 0511   BILITOT 0.8 10/05/2021 1429   PROT 6.2 (L) 08/29/2023 0511   PROT 6.2 06/24/2019 0837   ALBUMIN 3.3 (L) 08/29/2023 0511    Physical Exam: Vital Signs: BP (!) 170/55   Pulse (!) 129   Temp 98.2 F (36.8 C) (Axillary)   Resp (!) 28   SpO2 100%  SpO2: SpO2: 100 % O2 Device:   O2 Flow Rate:   Intake/output summary: No intake or output data in the 24 hours ending 08/29/23 0941 LBM:   Baseline Weight:   Most recent weight:    General: On BiPAP, chronically ill-appearing, sleepy Cardiovascular: Tachycardia noted Respiratory: increased work of breathing noted, on BiPAP Neuro: Sleepy          Palliative Performance Scale: 10%              Additional Data Reviewed: Recent Labs    08/29/23 0511 08/29/23 0523 08/29/23 0838  WBC 17.9*  --   --   HGB 11.1* 12.2* 11.9*  PLT 307  --   --  NA 122* 121* 122*  BUN 13 13 13   CREATININE 0.76 0.70 0.70    Imaging: DG Chest Portable 1 View CLINICAL DATA:  Hypoxia  EXAM: PORTABLE CHEST 1 VIEW  COMPARISON:   08/12/2023  FINDINGS: Generalized interstitial and airspace opacity is progressed. Lung volumes remain low. Normal heart size with stable mediastinal contours. No visible effusion or pneumothorax.  Extensive thoracolumbar fusion with fractured rods at the lower thoracic level, unchanged. There is indication of ordered thoracic and lumbar MR imaging.  Moderate gaseous distention of the stomach.  IMPRESSION: Generalized pulmonary opacification is progressed from prior, symmetry favoring edema. There may be underlying chronic interstitial lung disease or infection.  Electronically Signed   By: Tiburcio Pea M.D.   On: 08/29/2023 05:56    I personally reviewed recent imaging.   Palliative Care Assessment and Plan Summary of Established Goals of Care and Medical Treatment Preferences   Patient is an 78 year old male with past medical history of multiple lung issues including RSV/MSSA/pneumonia in the acute setting on chronic ILD, chronic back pain, diabetes mellitus type 2, hypertension, GERD, epidural abscess, and peripheral neuropathy who was brought to the ED on 08/29/2023 for worsening shortness of breath.  Patient had just recently had 17-day admission for acute respiratory failure in the setting of ILD and bronchiectasis exacerbation.  Since admission, patient has been requiring BiPAP support for work of breathing.  Pulmonology consulted for recommendations.  Palliative medicine team consulted to assist with complex medical decision making.  # Complex medical decision making/goals of care  -Discussed care with patient and wife at bedside as detailed above in HPI.  Patient and wife have discussed patient's continued medical deterioration despite hospitalizations and medical interventions.  Ultimately they would like to consider going home with support to enjoy quality time at home.  At this time would like to continue appropriate medical interventions to optimize patient's medical  status.  Discussed palliative medicine at home versus home with hospice.  Also discussed that if patient cannot get off of high-level respiratory support, may need to talk about transitioning care while here in the hospital to focus on comfort.  At this time we will continue appropriate medical interventions with hope that patient will be able to return home.  Palliative medicine team continuing to follow along to engage in complex medical decision making discussions moving forward.  -  Code Status: Limited: Do not attempt resuscitation (DNR) -DNR-LIMITED -Do Not Intubate/DNI    # Symptom management Patient is receiving these palliative interventions for symptom management with an intent to improve quality of life.   -Dyspnea/pain, in the setting of acute on chronic ILD   -Change IV Dilaudid to 0.5 mg every hour as needed   -Agitation/anxiety   -Change IV Haldol to 2 mg every 6 hours as needed   -Start IV Ativan 0.5 mg every 4 hours as needed   -Reviewed EKG which noted QTc of 463 on 08/29/2023  # Psycho-social/Spiritual Support:  - Support System: Wife -Patient's pastor was present at bedside  # Discharge Planning:  To Be Determined  Thank you for allowing the palliative care team to participate in the care [name].  Alvester Morin, DO Palliative Care Provider PMT # [phone]  If patient remains symptomatic despite maximum doses, please call PMT at [phone] between 0700 and 1900. Outside of these hours, please call attending, as PMT does not have night coverage.

## 2023-08-29 NOTE — Progress Notes (Signed)
Pt remains on BIPAP due to WOB.

## 2023-08-29 NOTE — ED Notes (Signed)
Admitting at [facility]. Pt sleeping on Bipap. Wife at [facility].

## 2023-08-29 NOTE — Progress Notes (Signed)
   08/29/23 0503  BiPAP/CPAP/SIPAP  $ Non-Invasive Ventilator  Non-Invasive Vent Initial  $ Face Mask Medium Yes  BiPAP/CPAP/SIPAP Pt Type Adult  BiPAP/CPAP/SIPAP V60  Mask Type Full face mask  Dentures removed? Not applicable  Mask Size Medium  Set Rate 8 breaths/min  Respiratory Rate 31 breaths/min  IPAP 14 cmH20  EPAP 8 cmH2O  FiO2 (%) 100 %  Minute Ventilation 22.1  Leak 6  Peak Inspiratory Pressure (PIP) 18  Tidal Volume (Vt) 734  Patient Home Equipment No  Auto Titrate No  Press High Alarm 30 cmH2O  Press Low Alarm 5 cmH2O

## 2023-08-29 NOTE — ED Notes (Signed)
c/o pain, pinpoints to L shoulder r/t previous/ recent fall. MD notified.

## 2023-08-29 NOTE — ED Notes (Signed)
Hospitalist at [facility]

## 2023-08-29 NOTE — ED Notes (Signed)
Resting comfortably on bipap, breathing with bipap.

## 2023-08-29 NOTE — H&P (Signed)
History and Physical    Patient: Luis Brown:[id number] DOB: [birth year] DOA: 08/29/2023 DOS: the patient was seen and examined on 08/29/2023 PCP: Bradd Canary, MD  Patient coming from: Home  Chief Complaint:  Chief Complaint  Patient presents with   Respiratory Distress   HPI: Luis Brown is a 79 y.o. male with medical history significant of acute pharyngitis, seasonal allergies, anxiety, BPH, herpes zoster, cervical DDD, lumbosacral DDD, type 2 diabetes, dyspnea, hypertension, GERD, epidural abscess, hyperglycemia, hyperlipidemia, insomnia, measles, peripheral neuropathy, overweight, history of walking pneumonia, testosterone deficiency who was recently discharged 9 days ago after a 17-day admission due to acute respiratory failure in the setting of ILD and bronchiectasis exacerbation who presented to the emergency department via EMS due to fatigue, progressively worse dyspnea, wheezing and lower extremity edema since yesterday.  He is currently sedated and unable to provide history.  History is provided by the patient's wife.  She stated that he did not want to come to the hospital and would be interested more in pursuing comfort measures then aggressive treatment.  Lab work: CBC showed a white count of 17.9 with 93% neutrophils, hemoglobin 11.1 g/dL platelets 440.  PT 26.6 INR 2.4.  Venous blood gas showed a pO2 of less than 31 mmHg and acid-base deficit of 2.6 mmol/L, but was otherwise unremarkable.  Troponin was 36 and 61 ng/L.  BNP 224.4 pg/mL.  CMP showed a corrected sodium of 124, potassium 3.9, chloride 87 and CO2 22 mmol/L.  Renal function was normal.  Calcium normalizes after correction to albumin level.  Glucose under 96 and total bilirubin 1.9 mg/dL.  Total protein 6.2 and albumin 3.3 g/dL.  Normal transaminases and alkaline phosphatase.  Coronavirus, influenza and RSV PCR were negative.  Imaging: Portable 1 view chest radiograph showing generalized pulmonary  opacification that has progressed from prior, favoring edema.  There may be underlying chronic interstitial lung disease or infection.   ED course: Initial vital signs were temperature 98.2 F, pulse time 35, respiration 32, BP 126/51 mmHg O2 sat 100%.  He was placed on BiPAP.  He received cefepime 2 g IVPB, fentanyl 50 mcg IVP, furosemide 40 mg IVP, lorazepam 1 mg IVP and vancomycin 1000 mg IVP.  Review of Systems: As mentioned in the history of present illness. All other systems reviewed and are negative. 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   Esophageal reflux 02/10/2015   Fatigue    HTN (hypertension)    Hyperglycemia    preDM    Hyperlipidemia    Insomnia    Low back pain radiating to both legs 01/16/2017   Measles as a child   Overweight(278.02)    Peripheral neuropathy 08/07/2019   Personal history of colonic polyps 10/27/2012   Follows with [facility]   Pneumonia    " walking"   Preventative health care    Testosterone deficiency 05/23/2012   Wears glasses    Past Surgical History:  Procedure Laterality Date  ATRIAL FIBRILLATION ABLATION N/A 01/27/2022   Procedure: ATRIAL FIBRILLATION ABLATION;  Surgeon: Regan Lemming, MD;  Location: [facility];  Service: Cardiovascular;  Laterality: N/A;   BACK SURGERY  2012 and 1994   Dr Charlesetta Garibaldi, screws and cage in low back   BIOPSY  01/09/2020   Procedure: BIOPSY;  Surgeon: Kathi Der, MD;  Location: [facility];  Service: Gastroenterology;;   BRONCHIAL BIOPSY   11/17/2020   Procedure: BRONCHIAL BIOPSIES;  Surgeon: Tomma Lightning, MD;  Location: [facility];  Service: Endoscopy;;   BRONCHIAL BRUSHINGS  11/17/2020   Procedure: BRONCHIAL BRUSHINGS;  Surgeon: Tomma Lightning, MD;  Location: [facility];  Service: Endoscopy;;   BRONCHIAL WASHINGS  11/17/2020   Procedure: BRONCHIAL WASHINGS;  Surgeon: Tomma Lightning, MD;  Location: [facility];  Service: Endoscopy;;   COLONOSCOPY WITH PROPOFOL N/A 01/09/2020   Procedure: COLONOSCOPY WITH PROPOFOL;  Surgeon: Kathi Der, MD;  Location: [facility];  Service: Gastroenterology;  Laterality: N/A;   ESOPHAGOGASTRODUODENOSCOPY (EGD) WITH PROPOFOL N/A 01/09/2020   Procedure: ESOPHAGOGASTRODUODENOSCOPY (EGD) WITH PROPOFOL;  Surgeon: Kathi Der, MD;  Location: [facility];  Service: Gastroenterology;  Laterality: N/A;   EYE SURGERY Bilateral    2016   HARDWARE REVISION  03/12/2019   REVISION OF HARDWARE THORACIC TEN-THORACIC ELEVEN WITH APPLICATION OF ADDITIONAL RODS AND ROD SLEEVES THORACIC EIGHT-THORACIC TWELVE 12-LUMBAR ONE (N/A )   HEMOSTASIS CONTROL  11/17/2020   Procedure: HEMOSTASIS CONTROL;  Surgeon: Tomma Lightning, MD;  Location: [facility];  Service: Endoscopy;;   IR US GUIDE BX ASP/DRAIN  11/13/2020   LAMINECTOMY  02/12/2019   DECOMPRESSIVE LAMINECTOMY THORACIC NINE-THORACIC TEN AND THORACIC TEN-THORACIC ELEVEN, EXTENSION OF THORACOLUMBAR FUSION FROM THORACIC TEN TO THORACIC FIVE, LOCAL BONE GRAFT, ALLOGRAFT AND VIVIGEN (N/A)   POLYPECTOMY  01/09/2020   Procedure: POLYPECTOMY;  Surgeon: Kathi Der, MD;  Location: [facility];  Service: Gastroenterology;;   REMOVAL OF RODS AND PEDICLE SCREWS T5 AND T6, EXPLORATION OF FUSION, BIOPSY TRANSPEDICULAR T5 AND T6 (N/A )  09/14/2020   SPINAL FUSION N/A 02/12/2019   Procedure: DECOMPRESSIVE LAMINECTOMY THORACIC NINE-THORACIC TEN AND THORACIC TEN-THORACIC ELEVEN, EXTENSION OF THORACOLUMBAR FUSION FROM THORACIC TEN TO THORACIC FIVE,  LOCAL BONE GRAFT, ALLOGRAFT AND VIVIGEN;  Surgeon: Kerrin Champagne, MD;  Location: [facility];  Service: Orthopedics;  Laterality: N/A;  DECOMPRESSIVE LAMINECTOMY THORACIC NINE-THORACIC TEN AND THORACIC TEN-THORACIC ELEVEN, EXTENSION OF TH   SPINAL FUSION N/A 03/12/2019   Procedure: REVISION OF HARDWARE THORACIC TEN-THORACIC ELEVEN WITH APPLICATION OF ADDITIONAL RODS AND ROD SLEEVES THORACIC EIGHT-THORACIC TWELVE 12-LUMBAR ONE;  Surgeon: Kerrin Champagne, MD;  Location: [facility];  Service: Orthopedics;  Laterality: N/A;   TONSILLECTOMY     as child   torn rotator cuff  2010   right   VIDEO BRONCHOSCOPY N/A 11/17/2020   Procedure: VIDEO BRONCHOSCOPY WITH FLUORO;  Surgeon: Tomma Lightning, MD;  Location: [facility];  Service: Endoscopy;  Laterality: N/A;   Social History:  reports that he quit smoking about 34 years ago. His smoking use included cigarettes. He started smoking about 54 years ago. He has a 20 pack-year smoking history. He has never used smokeless tobacco. He reports that he does not currently use alcohol. He reports that he does not use drugs.  Allergies  Allergen Reactions   Daptomycin     Drug induced pneumonia    Diltiazem Other (See Comments)    Passed out   Ofev [Nintedanib] Diarrhea    fatigue   Pirfenidone Diarrhea  fatigue    Family History  Problem Relation Age of Onset   Hypertension Mother    Diabetes Mother        type 2   Cancer Mother 4       breast in remission   Emphysema Father        smoker   COPD Father        smoker   Stroke Father 56       mini   Heart disease Father    Hypertension Sister    Hyperlipidemia Sister    Scoliosis Sister    Osteoporosis Sister    Hypertension Maternal Grandmother    Scoliosis Maternal Grandmother    Heart disease Maternal Grandfather    Heart disease Paternal Grandfather        smoker   Heart disease Daughter     Prior to Admission medications   Medication Sig Start Date End Date Taking? Authorizing Provider   acetaminophen (TYLENOL) 325 MG tablet Take 2 tablets (650 mg total) by mouth every 4 (four) hours. 08/20/23   Rhetta Mura, MD  albuterol (PROVENTIL HFA) 108 (90 Base) MCG/ACT inhaler TAKE 2 PUFFS BY MOUTH EVERY 6 HOURS AS NEEDED FOR WHEEZE OR SHORTNESS OF BREATH 03/16/23   Kalman Shan, MD  arformoterol (BROVANA) 15 MCG/2ML NEBU Take 2 mLs (15 mcg total) by nebulization 2 (two) times daily. 08/24/23   Kalman Shan, MD  budesonide (PULMICORT) 0.5 MG/2ML nebulizer solution Take 2 mLs (0.5 mg total) by nebulization 2 (two) times daily. 08/20/23 09/19/23  Rhetta Mura, MD  busPIRone (BUSPAR) 15 MG tablet Take 1 tablet (15 mg total) by mouth 3 (three) times daily. Patient taking differently: Take 15 mg by mouth 3 (three) times daily as needed. 04/26/23   Bradd Canary, MD  Cyanocobalamin (VITAMIN B-12 PO) Take by mouth.    [provider]  dextromethorphan-guaiFENesin (MUCINEX DM) 30-600 MG 12hr tablet Take 1 tablet by mouth 2 (two) times daily.    [provider]  fluticasone (FLONASE) 50 MCG/ACT nasal spray SPRAY 2 SPRAYS INTO EACH NOSTRIL EVERY DAY 05/02/22   Saguier, Ramon Dredge, PA-C  glucose blood (CONTOUR NEXT TEST) test strip Check blood sugar once daily 02/25/19   Saguier, Ramon Dredge, PA-C  insulin aspart (NOVOLOG) 100 UNIT/ML injection Inject 6 Units into the skin 3 (three) times daily with meals. 08/20/23   Rhetta Mura, MD  insulin glargine (LANTUS SOLOSTAR) 100 UNIT/ML Solostar Pen Inject 26 Units into the skin daily. 02/17/23   Bradd Canary, MD  insulin lispro (HUMALOG) 100 UNIT/ML injection Inject 0.03 mLs (3 Units total) into the skin 3 (three) times daily with meals. Patient taking differently: Inject 3 Units into the skin 3 (three) times daily as needed for high blood sugar. 11/04/22   Bradd Canary, MD  Insulin Pen Needle (B-D UF III MINI PEN NEEDLES) 31G X 5 MM MISC USE WITH LANTUS 12/01/22   Bradd Canary, MD  Insulin Pen Needle 32G X 4 MM MISC Use  with insulin 12/15/22   Bradd Canary, MD  Insulin Syringe-Needle U-100 (B-D INS SYR ULTRAFINE 1CC/30G) 30G X 1/2" 1 ML MISC USE WITH LANTUS 02/01/23   Bradd Canary, MD  Insulin Syringes, Disposable, U-100 1 ML MISC USE WITH LANTUS 12/02/22   Bradd Canary, MD  metoprolol succinate (TOPROL-XL) 25 MG 24 hr tablet Take 0.5 tablets (12.5 mg total) by mouth daily. 08/20/23   Rhetta Mura, MD  Nebulizers (COMPRESSOR/NEBULIZER) MISC 1 Units by Does  not apply route daily as needed. 08/20/23   Rhetta Mura, MD  oxyCODONE-acetaminophen (PERCOCET/ROXICET) 5-325 MG tablet Take 1 tablet by mouth every 4 (four) hours as needed for pain- frequency change 08/20/23   Rhetta Mura, MD  predniSONE (DELTASONE) 5 MG tablet Take 4 tablets (20 mg total) by mouth daily with breakfast for 3 days, THEN 2 tablets (10 mg total) daily with breakfast for 10 days, THEN 1 tablet (5 mg total) daily with breakfast. 08/21/23 10/03/23  Rhetta Mura, MD  pregabalin (LYRICA) 75 MG capsule TAKE 1 CAPSULE BY MOUTH TWICE A DAY Patient taking differently: Take 75 mg by mouth 2 (two) times daily. 03/09/20   Kerrin Champagne, MD  revefenacin (YUPELRI) 175 MCG/3ML nebulizer solution Take 3 mLs (175 mcg total) by nebulization daily. 08/24/23   Kalman Shan, MD  sitaGLIPtin (JANUVIA) 50 MG tablet Take 1 tablet (50 mg total) by mouth daily. 07/04/23   Bradd Canary, MD  tamsulosin (FLOMAX) 0.4 MG CAPS capsule Take 1 capsule (0.4 mg total) by mouth every evening. 08/20/23   Rhetta Mura, MD  tirzepatide [facility]) 2.5 MG/0.5ML Pen INJECT 2.5 MG SUBCUTANEOUSLY WEEKLY 08/15/23   Bradd Canary, MD  tiZANidine (ZANAFLEX) 4 MG tablet Take 1 and 1/2 tablets (6 mg total) by mouth every 4 hours. Patient taking differently: Take 4-6 mg by mouth every 6 (six) hours as needed for muscle spasms. 08/11/22     venlafaxine XR (EFFEXOR-XR) 150 MG 24 hr capsule Take 1 capsule (150 mg total) by mouth daily with breakfast. 06/06/23    Bradd Canary, MD  warfarin (COUMADIN) 5 MG tablet TAKE 1 TO 2 TABLETS DAILY OR AS PRESCRIBED BY COUMADIN CLINIC Patient taking differently: Take 5-7.5 mg by mouth as directed. TAKE 1.5 TABLETS (7.5 MG) ON (TUESDAYS & SATURDAYS) & TAKE 1 TABLET (5 MG) ALL OTHER DAYS AS PRESCRIBED BY COUMADIN CLINIC 02/16/23   Tobb, Kardie, DO  zolpidem (AMBIEN) 10 MG tablet Take 0.5-1 tablets (5-10 mg total) by mouth at bedtime as needed for sleep. Patient taking differently: Take 10 mg by mouth at bedtime. 08/03/23   Eulis Foster, FNP    Physical Exam: Vitals:   08/29/23 0630 08/29/23 0645 08/29/23 0700 08/29/23 0715  BP: 119/72 (!) 151/100 (!) 144/91 (!) 145/88  Pulse: (!) 120 (!) 136 (!) 132   Resp: (!) 22 (!) 32 (!) 25 (!) 29  SpO2: 100% 100% 100% 100%   Physical Exam Vitals and nursing note reviewed.  Constitutional:      General: He is awake. He is not in acute distress.    Appearance: Normal appearance. He is ill-appearing.     Interventions: Face mask in place.  HENT:     Head: Normocephalic.     Nose: No rhinorrhea.     Mouth/Throat:     Mouth: Mucous membranes are dry.  Eyes:     General: No scleral icterus.    Pupils: Pupils are equal, round, and reactive to light.  Neck:     Vascular: No JVD.  Cardiovascular:     Rate and Rhythm: Normal rate and regular rhythm.     Heart sounds: S1 normal and S2 normal.  Pulmonary:     Effort: Pulmonary effort is normal.     Breath sounds: Wheezing, rhonchi and rales present.  Abdominal:     General: Bowel sounds are normal. There is no distension.     Palpations: Abdomen is soft.     Tenderness: There is no abdominal tenderness.  Musculoskeletal:     Cervical back: Neck supple.     Right lower leg: 2+ Edema present.     Left lower leg: 2+ Edema present.  Skin:    General: Skin is warm.  Neurological:     General: No focal deficit present.     Mental Status: He is alert.  Psychiatric:        Mood and Affect: Mood normal.         Behavior: Behavior is cooperative.    Data Reviewed:  Results are pending, will review when available. 08/08/2023 echocardiogram report. IMPRESSIONS:   1. Left ventricular ejection fraction, by estimation, is 60 to 65%. The  left ventricle has normal function. The left ventricle has no regional  wall motion abnormalities. Left ventricular diastolic parameters are  consistent with Grade I diastolic  dysfunction (impaired relaxation).   2. Right ventricular systolic function is normal. The right ventricular  size is normal. Tricuspid regurgitation signal is inadequate for assessing  PA pressure.   3. The mitral valve is normal in structure. No evidence of mitral valve  regurgitation. No evidence of mitral stenosis.   4. The aortic valve is normal in structure. Aortic valve regurgitation is  not visualized. No aortic stenosis is present.   5. The inferior vena cava is normal in size with greater than 50%  respiratory variability, suggesting right atrial pressure of 3 mmHg.   EKG: Vent. rate 147 BPM PR interval 122 ms QRS duration 96 ms QT/QTcB 296/463 ms P-R-T axes 37 -30 31 Sinus tachycardia Left axis deviation Abnormal R-wave progression, late transition ST depression, probably rate related  Assessment and Plan: Principal Problem:   Acute on chronic respiratory failure with hypoxia (HCC) In the setting of:   Pulmonary fibrosis (HCC) Presenting with   COPD with acute exacerbation (HCC) and   Bronchiectasis with acute exacerbation (HCC) Stepdown/inpatient. Continue supplemental oxygen. Continue arformoterol 15 mcg nebs twice daily. Continue budesonide 500 mcg nebs twice daily.   Continue revefenacin 175 mcg nebs daily. Methylprednisolone 125 mg IVP x1 given by EMS. -Will be followed by prednisone 40 mg p.o. daily in a.m. EMS also gave magnesium sulfate 2 g IVPB. Follow-up CBC and chemistry in the morning.  Pulmonary consult appreciated. -I agree with Precedex  infusion. -I agree with palliative care consultation.  Active Problems:   Grade I diastolic dysfunction Presenting with:   Volume overload Continue BiPAP/oxygen supplementation. Continue furosemide as needed. Follow-up electrolytes and renal function in AM. Monitor daily weights, intake and output. Switch metoprolol to IV form. -Resume oral formulation once off BiPAP.    Elevated troponin No significant uptrend. This is secondary to demand ischemia.    Paroxysmal atrial fibrillation (HCC) CHA?DS?-VASc Score score of at least 6. Continue metoprolol for rate control. Warfarin per pharmacy.    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 Monitor intake/output. Follow-up sodium level.    Essential hypertension On equivalent oral dose parenteral metoprolol.    BPH (benign prostatic hyperplasia) Continue tamsulosin 0.4 mg p.o. every evening.    GERD (gastroesophageal reflux disease) Antiacid, H2 blocker or PPI as needed.    Thoracic degenerative disc disease   Spinal stenosis of lumbar region without neurogenic claudication Analgesics and muscle relaxant as needed. Continue pregabalin 75 mg p.o. twice daily.    History of DVT (deep vein thrombosis) On warfarin for A-fib.    Hyperlipidemia Not on statin. Interested in palliative care.    Insulin dependent type 2 diabetes mellitus (HCC) Carbohydrate modified  diet. CBG monitoring with RI SS. Continue insulin glargine 26 units daily. Continue Januvia or formulary equivalent.     Advance Care Planning:   Code Status: Limited: Do not attempt resuscitation (DNR) -DNR-LIMITED -Do Not Intubate/DNI    Consults: PCCM Levon Hedger, MD. Palliative care medicine.  Family Communication: His wife was at bedside.  Severity of Illness: The appropriate patient status for this patient is INPATIENT. Inpatient status is judged to be reasonable and necessary in order to provide the required intensity of service to ensure  the patient's safety. The patient's presenting symptoms, physical exam findings, and initial radiographic and laboratory data in the context of their chronic comorbidities is felt to place them at high risk for further clinical deterioration. Furthermore, it is not anticipated that the patient will be medically stable for discharge from the hospital within 2 midnights of admission.   * I certify that at the point of admission it is my clinical judgment that the patient will require inpatient hospital care spanning beyond 2 midnights from the point of admission due to high intensity of service, high risk for further deterioration and high frequency of surveillance required.*  Author: Bobette Mo, MD 08/29/2023 7:41 AM  For on call review [email or web address].   This document was prepared using Dragon voice recognition software and may contain some unintended transcription errors.

## 2023-08-29 NOTE — Consult Note (Signed)
NAME:  Luis Brown, MRN:  [id number], DOB:  07/17/1946, LOS: 0 ADMISSION DATE:  08/29/2023, CONSULTATION DATE:  08/29/23 REFERRING MD:  Robb Matar, CHIEF COMPLAINT:  SOB   History of Present Illness:  78 year old man with hx of myriad of lung issues dating back to 2022 (see Dr. 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  Also had a fall preceding in which he hit his sternum.  He is currently on BIPAP anxious and asking if he can have something to drink.  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   Esophageal reflux 02/10/2015   Fatigue    HTN (hypertension)    Hyperglycemia    preDM    Hyperlipidemia    Insomnia    Low back pain radiating to both legs 01/16/2017   Measles as a child   Overweight(278.02)    Peripheral neuropathy 08/07/2019   Personal history of colonic polyps 10/27/2012   Follows with [facility]   Pneumonia    " walking"   Preventative health care    Testosterone deficiency 05/23/2012   Wears glasses      Significant Hospital Events: Including procedures, antibiotic start and stop dates in addition to other pertinent events   2/11 admit  Interim History / Subjective:  Consult  Objective   Blood pressure (!) 170/55, pulse (!) 129, temperature 98.2 F (36.8 C), temperature source Axillary, resp. rate (!) 28, SpO2 100%.     FiO2 (%):  [70 %-100 %] 70 %  No intake or output data in the 24 hours ending 08/29/23 0917 There were no vitals filed for this visit.  Examination: General: chronically ill appearing man on BIPAP HENT: BIPAP with good seal Lungs: crackles BL, +accessory muscle use Cardiovascular: irregular, Afib on monitor Abdomen: soft, hypoactive BS Extremities: 2+ edema Neuro: moves to command Skin: age related changes  Leukocytosis CXR worse diffuse airspace disease  Resolved Hospital Problem list   N/A  Assessment & Plan:  Acute on chronic hypoxemic respiratory failure secondary to some combination of fluid overload, pneumonia, or inflammation. Hyponatremia recurrent thought to be SIADH? Underlying ILD was initially dapto-induced? Now just poorly defined FTT, chronic back pain Afib/RVR related to resp distress Anticoagulated  Overall sounds like patient has been miserable over past few months.  He and wife are potentially interested in trying to get him home with hospice services.  - Steroids, nebs, diuretics, abx - BIPAP with ativan +/- ativan, precedex for comfort - Palliative care consult  Best Practice (right click and "Reselect all SmartList Selections" daily)  Per primary  Labs   CBC: Recent Labs  Lab 08/29/23 0511 08/29/23 0523 08/29/23 0838  WBC 17.9*  --   --   NEUTROABS 16.5*  --   --   HGB 11.1* 12.2* 11.9*  HCT 34.7*  36.0* 35.0*  MCV 89.9  --   --   PLT 307  --   --     Basic Metabolic Panel: Recent Labs  Lab 08/29/23 0511 08/29/23 0523 08/29/23 0838  NA 122* 121* 122*  K 3.9 4.1 4.4  CL 87* 89* 88*  CO2 22  --   --   GLUCOSE 196* 197* 251*  BUN 13 13 13   CREATININE 0.76 0.70 0.70  CALCIUM 8.6*  --   --    GFR: Estimated Creatinine Clearance: 73.6 mL/min (by C-G formula based on SCr of 0.7 mg/dL). Recent Labs  Lab 08/29/23 0511 08/29/23 0518  WBC 17.9*  --   LATICACIDVEN  --  2.5*    Liver Function Tests: Recent Labs  Lab  08/29/23 0511  AST 28  ALT 12  ALKPHOS 72  BILITOT 1.9*  PROT 6.2*  ALBUMIN 3.3*   No results for input(s): "LIPASE", "AMYLASE" in the last 168 hours. No results for input(s): "AMMONIA" in the last 168 hours.  ABG    Component Value Date/Time   PHART 7.46 (H) 08/10/2023 2150   PCO2ART 41 08/10/2023 2150   PO2ART 143 (H) 08/10/2023 2150   HCO3 25.1 08/29/2023 0511   TCO2 26 08/29/2023 0838   ACIDBASEDEF 2.6 (H) 08/29/2023 0511   O2SAT 27.6 08/29/2023 0511     Coagulation Profile: Recent Labs  Lab 08/29/23 0511  INR 2.4*    Cardiac Enzymes: No results for input(s): "CKTOTAL", "CKMB", "CKMBINDEX", "TROPONINI" in the last 168 hours.  HbA1C: Hgb A1c MFr Bld  Date/Time Value Ref Range Status  05/19/2023 01:35 PM 8.8 (H) 4.6 - 6.5 % Final    Comment:    Glycemic Control Guidelines for People with Diabetes:Non Diabetic:  <6%Goal of Therapy: <7%Additional Action Suggested:  >8%   12/22/2022 01:21 PM 10.5 (H) 4.6 - 6.5 % Final    Comment:    Glycemic Control Guidelines for People with Diabetes:Non Diabetic:  <6%Goal of Therapy: <7%Additional Action Suggested:  >8%     CBG: Recent Labs  Lab 08/29/23 0742  GLUCAP 258*    Review of Systems:    Positive Symptoms in bold:  Constitutional fevers, chills, weight loss, fatigue, anorexia, malaise  Eyes decreased vision, double vision, eye irritation  Ears, Nose, Mouth, Throat sore throat, trouble swallowing, sinus congestion  Cardiovascular chest pain, paroxysmal nocturnal dyspnea, lower ext edema, palpitations   Respiratory SOB, cough, DOE, hemoptysis, wheezing  Gastrointestinal nausea, vomiting, diarrhea  Genitourinary burning with urination, trouble urinating  Musculoskeletal joint aches, joint swelling, back pain  Integumentary  rashes, skin lesions  Neurological focal weakness, focal numbness, trouble speaking, headaches  Psychiatric depression, anxiety, confusion  Endocrine polyuria, polydipsia, cold  intolerance, heat intolerance  Hematologic abnormal bruising, abnormal bleeding, unexplained nose bleeds  Allergic/Immunologic recurrent infections, hives, swollen lymph nodes     Past Medical History:  He,  has a past medical history of Acute pharyngitis (10/21/2013), Allergy, Anxiety and depression (10/25/2011), BPH (benign prostatic hyperplasia) (04/23/2012), Chicken pox (as a child), DDD (degenerative disc disease), DDD (degenerative disc disease), lumbosacral, Diabetes mellitus, Dyspnea, ED (erectile dysfunction) (04/23/2012), Elevated BP, 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   Surgical History:   Past Surgical History:  Procedure Laterality Date   ATRIAL FIBRILLATION ABLATION N/A 01/27/2022  Procedure: ATRIAL FIBRILLATION ABLATION;  Surgeon: Regan Lemming, MD;  Location: [facility];  Service: Cardiovascular;  Laterality: N/A;   BACK SURGERY  2012 and 1994   Dr Charlesetta Garibaldi, screws and cage in low back   BIOPSY  01/09/2020   Procedure: BIOPSY;  Surgeon: Kathi Der, MD;  Location: [facility];  Service: Gastroenterology;;   BRONCHIAL BIOPSY  11/17/2020   Procedure: BRONCHIAL BIOPSIES;  Surgeon: Tomma Lightning, MD;  Location: [facility];  Service: Endoscopy;;   BRONCHIAL BRUSHINGS  11/17/2020   Procedure: BRONCHIAL BRUSHINGS;  Surgeon: Tomma Lightning, MD;  Location: [facility];  Service: Endoscopy;;   BRONCHIAL WASHINGS  11/17/2020   Procedure: BRONCHIAL WASHINGS;  Surgeon: Tomma Lightning, MD;  Location: [facility];  Service: Endoscopy;;   COLONOSCOPY WITH PROPOFOL N/A 01/09/2020   Procedure: COLONOSCOPY WITH  PROPOFOL;  Surgeon: Kathi Der, MD;  Location: [facility];  Service: Gastroenterology;  Laterality: N/A;   ESOPHAGOGASTRODUODENOSCOPY (EGD) WITH PROPOFOL N/A 01/09/2020   Procedure: ESOPHAGOGASTRODUODENOSCOPY (EGD) WITH PROPOFOL;  Surgeon: Kathi Der, MD;  Location: [facility];  Service: Gastroenterology;  Laterality: N/A;   EYE SURGERY Bilateral    2016   HARDWARE REVISION  03/12/2019   REVISION OF HARDWARE THORACIC TEN-THORACIC ELEVEN WITH APPLICATION OF ADDITIONAL RODS AND ROD SLEEVES THORACIC EIGHT-THORACIC TWELVE 12-LUMBAR ONE (N/A )   HEMOSTASIS CONTROL  11/17/2020   Procedure: HEMOSTASIS CONTROL;  Surgeon: Tomma Lightning, MD;  Location: [facility];  Service: Endoscopy;;   IR US GUIDE BX ASP/DRAIN  11/13/2020   LAMINECTOMY  02/12/2019   DECOMPRESSIVE LAMINECTOMY THORACIC NINE-THORACIC TEN AND THORACIC TEN-THORACIC ELEVEN, EXTENSION OF THORACOLUMBAR FUSION FROM THORACIC TEN TO THORACIC FIVE, LOCAL BONE GRAFT, ALLOGRAFT AND VIVIGEN (N/A)   POLYPECTOMY  01/09/2020   Procedure: POLYPECTOMY;  Surgeon: Kathi Der, MD;  Location: [facility];  Service: Gastroenterology;;   REMOVAL OF RODS AND PEDICLE SCREWS T5 AND T6, EXPLORATION OF FUSION, BIOPSY TRANSPEDICULAR T5 AND T6 (N/A )  09/14/2020   SPINAL FUSION N/A 02/12/2019   Procedure: DECOMPRESSIVE LAMINECTOMY THORACIC NINE-THORACIC TEN AND THORACIC TEN-THORACIC ELEVEN, EXTENSION OF THORACOLUMBAR FUSION FROM THORACIC TEN TO THORACIC FIVE, LOCAL BONE GRAFT, ALLOGRAFT AND VIVIGEN;  Surgeon: Kerrin Champagne, MD;  Location: [facility];  Service: Orthopedics;  Laterality: N/A;  DECOMPRESSIVE LAMINECTOMY THORACIC NINE-THORACIC TEN AND THORACIC TEN-THORACIC ELEVEN, EXTENSION OF TH   SPINAL FUSION N/A 03/12/2019   Procedure: REVISION OF HARDWARE THORACIC TEN-THORACIC ELEVEN WITH APPLICATION OF ADDITIONAL RODS AND ROD SLEEVES THORACIC EIGHT-THORACIC TWELVE 12-LUMBAR ONE;  Surgeon: Kerrin Champagne, MD;  Location: [facility];  Service: Orthopedics;   Laterality: N/A;   TONSILLECTOMY     as child   torn rotator cuff  2010   right   VIDEO BRONCHOSCOPY N/A 11/17/2020   Procedure: VIDEO BRONCHOSCOPY WITH FLUORO;  Surgeon: Tomma Lightning, MD;  Location: [facility];  Service: Endoscopy;  Laterality: N/A;     Social History:   reports that he quit smoking about 34 years ago. His smoking use included cigarettes. He started smoking about 54 years ago. He has a 20 pack-year smoking history. He has never used smokeless tobacco. He reports that he does not currently use alcohol. He reports that he does not use drugs.   Family History:  His family history includes COPD in his father; Cancer (age of onset: 59) in his mother; Diabetes in his mother; Emphysema in his father; Heart disease in his daughter, father, maternal grandfather, and paternal grandfather; Hyperlipidemia in his sister; Hypertension  in his maternal grandmother, mother, and sister; Osteoporosis in his sister; Scoliosis in his maternal grandmother and sister; Stroke (age of onset: 77) in his father.   Allergies Allergies  Allergen Reactions   Daptomycin     Drug induced pneumonia    Diltiazem Other (See Comments)    Passed out   Ofev [Nintedanib] Diarrhea    fatigue   Pirfenidone Diarrhea    fatigue     Home Medications  Prior to Admission medications   Medication Sig Start Date End Date Taking? Authorizing Provider  acetaminophen (TYLENOL) 325 MG tablet Take 2 tablets (650 mg total) by mouth every 4 (four) hours. 08/20/23   Rhetta Mura, MD  albuterol (PROVENTIL HFA) 108 (90 Base) MCG/ACT inhaler TAKE 2 PUFFS BY MOUTH EVERY 6 HOURS AS NEEDED FOR WHEEZE OR SHORTNESS OF BREATH 03/16/23   Kalman Shan, MD  arformoterol (BROVANA) 15 MCG/2ML NEBU Take 2 mLs (15 mcg total) by nebulization 2 (two) times daily. 08/24/23   Kalman Shan, MD  budesonide (PULMICORT) 0.5 MG/2ML nebulizer solution Take 2 mLs (0.5 mg total) by nebulization 2 (two) times daily. 08/20/23  09/19/23  Rhetta Mura, MD  busPIRone (BUSPAR) 15 MG tablet Take 1 tablet (15 mg total) by mouth 3 (three) times daily. Patient taking differently: Take 15 mg by mouth 3 (three) times daily as needed. 04/26/23   Bradd Canary, MD  Cyanocobalamin (VITAMIN B-12 PO) Take by mouth.    [provider]  dextromethorphan-guaiFENesin (MUCINEX DM) 30-600 MG 12hr tablet Take 1 tablet by mouth 2 (two) times daily.    [provider]  fluticasone (FLONASE) 50 MCG/ACT nasal spray SPRAY 2 SPRAYS INTO EACH NOSTRIL EVERY DAY 05/02/22   Saguier, Ramon Dredge, PA-C  glucose blood (CONTOUR NEXT TEST) test strip Check blood sugar once daily 02/25/19   Saguier, Ramon Dredge, PA-C  insulin aspart (NOVOLOG) 100 UNIT/ML injection Inject 6 Units into the skin 3 (three) times daily with meals. 08/20/23   Rhetta Mura, MD  insulin glargine (LANTUS SOLOSTAR) 100 UNIT/ML Solostar Pen Inject 26 Units into the skin daily. 02/17/23   Bradd Canary, MD  insulin lispro (HUMALOG) 100 UNIT/ML injection Inject 0.03 mLs (3 Units total) into the skin 3 (three) times daily with meals. Patient taking differently: Inject 3 Units into the skin 3 (three) times daily as needed for high blood sugar. 11/04/22   Bradd Canary, MD  Insulin Pen Needle (B-D UF III MINI PEN NEEDLES) 31G X 5 MM MISC USE WITH LANTUS 12/01/22   Bradd Canary, MD  Insulin Pen Needle 32G X 4 MM MISC Use with insulin 12/15/22   Bradd Canary, MD  Insulin Syringe-Needle U-100 (B-D INS SYR ULTRAFINE 1CC/30G) 30G X 1/2" 1 ML MISC USE WITH LANTUS 02/01/23   Bradd Canary, MD  Insulin Syringes, Disposable, U-100 1 ML MISC USE WITH LANTUS 12/02/22   Bradd Canary, MD  metoprolol succinate (TOPROL-XL) 25 MG 24 hr tablet Take 0.5 tablets (12.5 mg total) by mouth daily. 08/20/23   Rhetta Mura, MD  Nebulizers (COMPRESSOR/NEBULIZER) MISC 1 Units by Does not apply route daily as needed. 08/20/23   Rhetta Mura, MD  oxyCODONE-acetaminophen  (PERCOCET/ROXICET) 5-325 MG tablet Take 1 tablet by mouth every 4 (four) hours as needed for pain- frequency change 08/20/23   Rhetta Mura, MD  predniSONE (DELTASONE) 5 MG tablet Take 4 tablets (20 mg total) by mouth daily with breakfast for 3 days, THEN 2 tablets (10 mg total) daily with breakfast for  10 days, THEN 1 tablet (5 mg total) daily with breakfast. 08/21/23 10/03/23  Rhetta Mura, MD  pregabalin (LYRICA) 75 MG capsule TAKE 1 CAPSULE BY MOUTH TWICE A DAY Patient taking differently: Take 75 mg by mouth 2 (two) times daily. 03/09/20   Kerrin Champagne, MD  revefenacin (YUPELRI) 175 MCG/3ML nebulizer solution Take 3 mLs (175 mcg total) by nebulization daily. 08/24/23   Kalman Shan, MD  sitaGLIPtin (JANUVIA) 50 MG tablet Take 1 tablet (50 mg total) by mouth daily. 07/04/23   Bradd Canary, MD  tamsulosin (FLOMAX) 0.4 MG CAPS capsule Take 1 capsule (0.4 mg total) by mouth every evening. 08/20/23   Rhetta Mura, MD  tirzepatide [facility]) 2.5 MG/0.5ML Pen INJECT 2.5 MG SUBCUTANEOUSLY WEEKLY 08/15/23   Bradd Canary, MD  tiZANidine (ZANAFLEX) 4 MG tablet Take 1 and 1/2 tablets (6 mg total) by mouth every 4 hours. Patient taking differently: Take 4-6 mg by mouth every 6 (six) hours as needed for muscle spasms. 08/11/22     venlafaxine XR (EFFEXOR-XR) 150 MG 24 hr capsule Take 1 capsule (150 mg total) by mouth daily with breakfast. 06/06/23   Bradd Canary, MD  warfarin (COUMADIN) 5 MG tablet TAKE 1 TO 2 TABLETS DAILY OR AS PRESCRIBED BY COUMADIN CLINIC Patient taking differently: Take 5-7.5 mg by mouth as directed. TAKE 1.5 TABLETS (7.5 MG) ON (TUESDAYS & SATURDAYS) & TAKE 1 TABLET (5 MG) ALL OTHER DAYS AS PRESCRIBED BY COUMADIN CLINIC 02/16/23   Tobb, Kardie, DO  zolpidem (AMBIEN) 10 MG tablet Take 0.5-1 tablets (5-10 mg total) by mouth at bedtime as needed for sleep. Patient taking differently: Take 10 mg by mouth at bedtime. 08/03/23   Eulis Foster, FNP     Critical care  time: N/A

## 2023-08-29 NOTE — ED Provider Notes (Signed)
[place] EMERGENCY DEPARTMENT AT [facility] Provider Note   CSN: [id number] Arrival date & time: 08/29/23  0448     History  Chief Complaint  Patient presents with   Respiratory Distress    Luis Brown is a 78 y.o. male.  The history is provided by the patient.   Luis Brown is a 78 y.o. male who presents to the Emergency Department complaining of respiratory distress presents to the emergency department for difficulty breathing.  He was discharged from the hospital on February 2 following a 16-day admission for RSV pneumonia in setting of chronic interstitial lung disease.  At baseline he is on 2 L of oxygen.  He has been on 3 L since discharge and over the last 2 days has been increasing his oxygen to 4 L.  On fire arrival he was 55% on 4 L and placed on CPAP.  He received albuterol, Atrovent, Solu-Medrol prior to ED arrival.  Patient denies any chest pain but does report shortness of breath.  Wife reports increased lower extremity edema since Saturday.  He did have a fall on Sunday and bruised his chest but did not strike his head.  Yesterday his breathing became slightly more labored and this morning it became significantly more labored    Home Medications Prior to Admission medications   Medication Sig Start Date End Date Taking? Authorizing Provider  acetaminophen (TYLENOL) 325 MG tablet Take 2 tablets (650 mg total) by mouth every 4 (four) hours. 08/20/23   Rhetta Mura, MD  albuterol (PROVENTIL HFA) 108 (90 Base) MCG/ACT inhaler TAKE 2 PUFFS BY MOUTH EVERY 6 HOURS AS NEEDED FOR WHEEZE OR SHORTNESS OF BREATH 03/16/23   Kalman Shan, MD  arformoterol (BROVANA) 15 MCG/2ML NEBU Take 2 mLs (15 mcg total) by nebulization 2 (two) times daily. 08/24/23   Kalman Shan, MD  budesonide (PULMICORT) 0.5 MG/2ML nebulizer solution Take 2 mLs (0.5 mg total) by nebulization 2 (two) times daily. 08/20/23 09/19/23  Rhetta Mura, MD  busPIRone (BUSPAR) 15 MG  tablet Take 1 tablet (15 mg total) by mouth 3 (three) times daily. Patient taking differently: Take 15 mg by mouth 3 (three) times daily as needed. 04/26/23   Bradd Canary, MD  Cyanocobalamin (VITAMIN B-12 PO) Take by mouth.    [provider]  dextromethorphan-guaiFENesin (MUCINEX DM) 30-600 MG 12hr tablet Take 1 tablet by mouth 2 (two) times daily.    [provider]  fluticasone (FLONASE) 50 MCG/ACT nasal spray SPRAY 2 SPRAYS INTO EACH NOSTRIL EVERY DAY 05/02/22   Saguier, Ramon Dredge, PA-C  glucose blood (CONTOUR NEXT TEST) test strip Check blood sugar once daily 02/25/19   Saguier, Ramon Dredge, PA-C  insulin aspart (NOVOLOG) 100 UNIT/ML injection Inject 6 Units into the skin 3 (three) times daily with meals. 08/20/23   Rhetta Mura, MD  insulin glargine (LANTUS SOLOSTAR) 100 UNIT/ML Solostar Pen Inject 26 Units into the skin daily. 02/17/23   Bradd Canary, MD  insulin lispro (HUMALOG) 100 UNIT/ML injection Inject 0.03 mLs (3 Units total) into the skin 3 (three) times daily with meals. Patient taking differently: Inject 3 Units into the skin 3 (three) times daily as needed for high blood sugar. 11/04/22   Bradd Canary, MD  Insulin Pen Needle (B-D UF III MINI PEN NEEDLES) 31G X 5 MM MISC USE WITH LANTUS 12/01/22   Bradd Canary, MD  Insulin Pen Needle 32G X 4 MM MISC Use with insulin 12/15/22   Bradd Canary,  MD  Insulin Syringe-Needle U-100 (B-D INS SYR ULTRAFINE 1CC/30G) 30G X 1/2" 1 ML MISC USE WITH LANTUS 02/01/23   Bradd Canary, MD  Insulin Syringes, Disposable, U-100 1 ML MISC USE WITH LANTUS 12/02/22   Bradd Canary, MD  metoprolol succinate (TOPROL-XL) 25 MG 24 hr tablet Take 0.5 tablets (12.5 mg total) by mouth daily. 08/20/23   Rhetta Mura, MD  Nebulizers (COMPRESSOR/NEBULIZER) MISC 1 Units by Does not apply route daily as needed. 08/20/23   Rhetta Mura, MD  oxyCODONE-acetaminophen (PERCOCET/ROXICET) 5-325 MG tablet Take 1 tablet by mouth every 4  (four) hours as needed for pain- frequency change 08/20/23   Rhetta Mura, MD  predniSONE (DELTASONE) 5 MG tablet Take 4 tablets (20 mg total) by mouth daily with breakfast for 3 days, THEN 2 tablets (10 mg total) daily with breakfast for 10 days, THEN 1 tablet (5 mg total) daily with breakfast. 08/21/23 10/03/23  Rhetta Mura, MD  pregabalin (LYRICA) 75 MG capsule TAKE 1 CAPSULE BY MOUTH TWICE A DAY Patient taking differently: Take 75 mg by mouth 2 (two) times daily. 03/09/20   Kerrin Champagne, MD  revefenacin (YUPELRI) 175 MCG/3ML nebulizer solution Take 3 mLs (175 mcg total) by nebulization daily. 08/24/23   Kalman Shan, MD  sitaGLIPtin (JANUVIA) 50 MG tablet Take 1 tablet (50 mg total) by mouth daily. 07/04/23   Bradd Canary, MD  tamsulosin (FLOMAX) 0.4 MG CAPS capsule Take 1 capsule (0.4 mg total) by mouth every evening. 08/20/23   Rhetta Mura, MD  tirzepatide [facility]) 2.5 MG/0.5ML Pen INJECT 2.5 MG SUBCUTANEOUSLY WEEKLY 08/15/23   Bradd Canary, MD  tiZANidine (ZANAFLEX) 4 MG tablet Take 1 and 1/2 tablets (6 mg total) by mouth every 4 hours. Patient taking differently: Take 4-6 mg by mouth every 6 (six) hours as needed for muscle spasms. 08/11/22     venlafaxine XR (EFFEXOR-XR) 150 MG 24 hr capsule Take 1 capsule (150 mg total) by mouth daily with breakfast. 06/06/23   Bradd Canary, MD  warfarin (COUMADIN) 5 MG tablet TAKE 1 TO 2 TABLETS DAILY OR AS PRESCRIBED BY COUMADIN CLINIC Patient taking differently: Take 5-7.5 mg by mouth as directed. TAKE 1.5 TABLETS (7.5 MG) ON (TUESDAYS & SATURDAYS) & TAKE 1 TABLET (5 MG) ALL OTHER DAYS AS PRESCRIBED BY COUMADIN CLINIC 02/16/23   Tobb, Kardie, DO  zolpidem (AMBIEN) 10 MG tablet Take 0.5-1 tablets (5-10 mg total) by mouth at bedtime as needed for sleep. Patient taking differently: Take 10 mg by mouth at bedtime. 08/03/23   Worthy Rancher B, FNP      Allergies    Daptomycin, Diltiazem, Ofev [nintedanib], and Pirfenidone     Review of Systems   Review of Systems  All other systems reviewed and are negative.   Physical Exam Updated Vital Signs BP (!) 145/88   Pulse (!) 132   Temp 98.2 F (36.8 C) (Axillary)   Resp (!) 29   SpO2 100%  Physical Exam Vitals and nursing note reviewed.  Constitutional:      General: He is in acute distress.     Appearance: He is well-developed. He is ill-appearing.  HENT:     Head: Normocephalic and atraumatic.  Cardiovascular:     Rate and Rhythm: Regular rhythm. Tachycardia present.     Heart sounds: No murmur heard. Pulmonary:     Effort: Respiratory distress present.     Comments: Decreased air movement bilaterally with occasional rhonchi.  Ecchymosis over the left upper  chest wall Abdominal:     Palpations: Abdomen is soft.     Tenderness: There is no abdominal tenderness. There is no guarding or rebound.  Musculoskeletal:        General: No tenderness.     Comments: 2+ pitting edema to bilateral lower extremities  Skin:    General: Skin is warm and dry.  Neurological:     Mental Status: He is alert and oriented to person, place, and time.  Psychiatric:        Behavior: Behavior normal.     ED Results / Procedures / Treatments   Labs (all labs ordered are listed, but only abnormal results are displayed) Labs Reviewed  COMPREHENSIVE METABOLIC PANEL - Abnormal; Notable for the following components:      Result Value   Sodium 122 (*)    Chloride 87 (*)    Glucose, Bld 196 (*)    Calcium 8.6 (*)    Total Protein 6.2 (*)    Albumin 3.3 (*)    Total Bilirubin 1.9 (*)    All other components within normal limits  BRAIN NATRIURETIC PEPTIDE - Abnormal; Notable for the following components:   B Natriuretic Peptide 224.4 (*)    All other components within normal limits  CBC WITH DIFFERENTIAL/PLATELET - Abnormal; Notable for the following components:   WBC 17.9 (*)    RBC 3.86 (*)    Hemoglobin 11.1 (*)    HCT 34.7 (*)    RDW 16.2 (*)    Neutro Abs  16.5 (*)    Lymphs Abs 0.2 (*)    Abs Immature Granulocytes 0.14 (*)    All other components within normal limits  BLOOD GAS, VENOUS - Abnormal; Notable for the following components:   pO2, Ven <31 (*)    Acid-base deficit 2.6 (*)    All other components within normal limits  PROTIME-INR - Abnormal; Notable for the following components:   Prothrombin Time 26.6 (*)    INR 2.4 (*)    All other components within normal limits  I-STAT CHEM 8, ED - Abnormal; Notable for the following components:   Sodium 121 (*)    Chloride 89 (*)    Glucose, Bld 197 (*)    Calcium, Ion 1.08 (*)    Hemoglobin 12.2 (*)    HCT 36.0 (*)    All other components within normal limits  I-STAT CG4 LACTIC ACID, ED - Abnormal; Notable for the following components:   Lactic Acid, Venous 2.5 (*)    All other components within normal limits  CBG MONITORING, ED - Abnormal; Notable for the following components:   Glucose-Capillary 258 (*)    All other components within normal limits  TROPONIN I (HIGH SENSITIVITY) - Abnormal; Notable for the following components:   Troponin I (High Sensitivity) 36 (*)    All other components within normal limits  RESP PANEL BY RT-PCR (RSV, FLU A&B, COVID)  RVPGX2  CULTURE, BLOOD (ROUTINE X 2)  CULTURE, BLOOD (ROUTINE X 2)  LACTIC ACID, PLASMA  TROPONIN I (HIGH SENSITIVITY)    EKG EKG Interpretation Date/Time:  Tuesday August 29 2023 04:57:09 EST Ventricular Rate:  147 PR Interval:  122 QRS Duration:  96 QT Interval:  296 QTC Calculation: 463 R Axis:   -30  Text Interpretation: Sinus tachycardia Left axis deviation Abnormal R-wave progression, late transition ST depression, probably rate related Confirmed by Tilden Fossa [phone]) on 08/29/2023 5:13:39 AM  Radiology DG Chest Portable 1 View Result Date: 08/29/2023 CLINICAL  DATA:  Hypoxia EXAM: PORTABLE CHEST 1 VIEW COMPARISON:  08/12/2023 FINDINGS: Generalized interstitial and airspace opacity is progressed. Lung  volumes remain low. Normal heart size with stable mediastinal contours. No visible effusion or pneumothorax. Extensive thoracolumbar fusion with fractured rods at the lower thoracic level, unchanged. There is indication of ordered thoracic and lumbar MR imaging. Moderate gaseous distention of the stomach. IMPRESSION: Generalized pulmonary opacification is progressed from prior, symmetry favoring edema. There may be underlying chronic interstitial lung disease or infection. Electronically Signed   By: Tiburcio Pea M.D.   On: 08/29/2023 05:56    Procedures Procedures   CRITICAL CARE Performed by: Tilden Fossa   Total critical care time: 45 minutes  Critical care time was exclusive of separately billable procedures and treating other patients.  Critical care was necessary to treat or prevent imminent or life-threatening deterioration.  Critical care was time spent personally by me on the following activities: development of treatment plan with patient and/or surrogate as well as nursing, discussions with consultants, evaluation of patient's response to treatment, examination of patient, obtaining history from patient or surrogate, ordering and performing treatments and interventions, ordering and review of laboratory studies, ordering and review of radiographic studies, pulse oximetry and re-evaluation of patient's condition.  Medications Ordered in ED Medications  insulin aspart (novoLOG) injection 0-15 Units (has no administration in time range)  vancomycin (VANCOCIN) IVPB 1000 mg/200 mL premix (0 mg Intravenous Stopped 08/29/23 0702)  ceFEPIme (MAXIPIME) 2 g in sodium chloride 0.9 % 100 mL IVPB (0 g Intravenous Stopped 08/29/23 0640)  furosemide (LASIX) injection 40 mg (40 mg Intravenous Given 08/29/23 0554)  fentaNYL (SUBLIMAZE) injection 50 mcg (50 mcg Intravenous Given 08/29/23 1610)    ED Course/ Medical Decision Making/ A&P Clinical Course as of 08/29/23 0751  Tue Aug 29, 2023   0731 DNR/DNI. Pulmonary edema on BiPAP. Admitted to [facility]. [MP]    Clinical Course User Index [MP] Royanne Foots, DO                                 Medical Decision Making Amount and/or Complexity of Data Reviewed Labs: ordered. Radiology: ordered.  Risk Prescription drug management. Decision regarding hospitalization.   Patient with interstitial lung disease, recent admission for RSV and pneumonia on chronic oxygen here for evaluation of shortness of breath and increased work of breathing.  Patient in respiratory distress at time of ED arrival, speaking in one-word sentences, accessory muscle use on BiPAP for respiratory support.  He did have sats in the 60s on EMS CPAP.  He also was tachycardic to the 150s.  EKG with sinus tachycardia.  Heart rate did improve after transitioning to BiPAP and his sats did improve.  On repeat assessment patient has improvement on his work of breathing, reports that he feels slightly improved.  Chest x-ray with pulmonary vascular congestion, cannot rule out infiltrate-images personally reviewed and interpreted, agree with radiologist interpretation.  CBC with leukocytosis.  Patient started on antibiotics for possible pneumonia although favor pulmonary edema as a driver for his symptoms.  He was also treated with furosemide for diuresis.  He received steroids prehospital.  Lactic acid is slightly elevated, current clinical picture is not consistent with sepsis, favor pulmonary edema as driver of his symptoms.  Overall he is volume overloaded and would not provide additional fluids at this time.  Had discussion with patient and wife regarding CODE STATUS and advanced directives.  They do report that he would not want intubated or CPR.  Discussed that he may benefit from palliative care consult given his progressively worsening symptoms as well as his desire ultimately to be at home.  Medicine consulted for admission for ongoing care and  treatment.        Final Clinical Impression(s) / ED Diagnoses Final diagnoses:  Acute pulmonary edema [facility])    Rx / DC Orders ED Discharge Orders     None         Tilden Fossa, MD 08/29/23 [id number]

## 2023-08-29 NOTE — Progress Notes (Signed)
Pharmacy Antibiotic Note  Luis Brown is a 78 y.o. male admitted on 08/29/2023 with acute on chronic respiratory failure. Recent admission 1/16-2/2 for RSV, MSSA, stenotrophomonas pneumonia. Pharmacy has been consulted for Vancomycin dosing for pneumonia. Received Vancomycin 1g IV x 1 at 0558 this morning.   Plan: Vancomycin 1g IV q12h to keep AUC 400-550 Vancomycin levels at steady state, as indicated Cefepime/Azithromycin per MD Monitor renal function, cultures, clinical course  Height: 5\' 9"  (175.3 cm) Weight: 83.9 kg (184 lb 15.5 oz) IBW/kg (Calculated) : 70.7  Temp (24hrs), Avg:98.3 F (36.8 C), Min:97.9 F (36.6 C), Max:98.7 F (37.1 C)  Recent Labs  Lab 08/29/23 0511 08/29/23 0518 08/29/23 0523 08/29/23 0838 08/29/23 1200  WBC 17.9*  --   --   --   --   CREATININE 0.76  --  0.70 0.70  --   LATICACIDVEN  --  2.5*  --   --  1.5    Estimated Creatinine Clearance: 73.6 mL/min (by C-G formula based on SCr of 0.7 mg/dL).    Allergies  Allergen Reactions   Daptomycin Other (See Comments)    Drug-induced pneumonia- He is to NEVER AGAIN receive this!!   Ofev [Nintedanib] Other (See Comments)    Severe fatigue   Pirfenidone Other (See Comments)    Severe fatigue and aches/pains   Diltiazem Other (See Comments)    Passed out    Antimicrobials this admission: 2/11 Vancomycin >> 2/11 Cefepime >> 2/11 Azithromycin >> (2/13)  Dose adjustments this admission: --  Microbiology results: 2/11 BCx:  2/11 Respiratory panel: negative     Thank you for allowing pharmacy to be a part of this patient's care.   Greer Pickerel, PharmD, BCPS Clinical Pharmacist 08/29/2023 6:13 PM

## 2023-08-29 NOTE — ED Triage Notes (Signed)
Pt arrived from home with EMS for respiratory distress. Discharged from hospital 08/20/2023 after 16 day admission for RSV pneumonia.  Baseline home oxygen 2L. Wife has had pt on 3L at home since discharge and up to 4L over the past 2 days. On Fire arrival, sats 55% on 4L; placed on CPAP with sats increasing to 70-80% on cpap. 18g R AC; EMS gave 5mg  albuterol, 0.5mg  atrovent, 125mg  solumedrol, and 2 g Mag. EMS VS 120 opulse; bp 170/100

## 2023-08-29 NOTE — Progress Notes (Addendum)
eLink Physician-Brief Progress Note Patient Name: Luis Brown DOB: July 26, 1945 MRN: [id number]   Date of Service  08/29/2023  HPI/Events of Note  Called for respiratory distress and increased WOB. Desaturations to 83%. Patient is DNR/DNI  BiPAP FIO2 increased from 40->80% on 14/8 Tachycardic with tele SVT/AFRVR to 150s SBP 200s  CXR this am with edema with chronic interstitial changes  On Precedex  eICU Interventions  Give PM dose of lasix now Lopressor 5 mg IV now Consider increasing precedex for comfort/WOB Recheck labs   10:30 PM K 3.6 Repleted for goal >4. HR improved to 110s-130s. Soft pressures on increased Precedex 0.8. Titrating precedex back down. Camera check, appears more comfortable  Intervention Category Major Interventions: Respiratory failure - evaluation and management  Luis Brown 08/29/2023, 8:24 PM

## 2023-08-29 NOTE — Progress Notes (Signed)
Rt tried to take pt off BIPAP and placed on 15 LPM HF. Pt sats 76% with increase WOB. Rt placed pt back on  BIPAP.

## 2023-08-29 NOTE — ED Notes (Signed)
Alert, NAD, calm, calmer, resting, breathing with bipap, wife at [facility]

## 2023-08-29 NOTE — Progress Notes (Signed)
PHARMACY - ANTICOAGULATION CONSULT NOTE  Pharmacy Consult for Warfarin Indication: Hx atrial fibrillation and VTE  Allergies  Allergen Reactions   Daptomycin     Drug induced pneumonia    Diltiazem Other (See Comments)    Passed out   Ofev [Nintedanib] Diarrhea    fatigue   Pirfenidone Diarrhea    fatigue    Patient Measurements:   Heparin Dosing Weight:   Vital Signs: Temp: 98.2 F (36.8 C) (02/11 0746) Temp Source: Axillary (02/11 0746) BP: 170/55 (02/11 0845) Pulse Rate: 129 (02/11 0845)  Labs: Recent Labs    08/29/23 0511 08/29/23 0523 08/29/23 0832 08/29/23 0838  HGB 11.1* 12.2*  --  11.9*  HCT 34.7* 36.0*  --  35.0*  PLT 307  --   --   --   LABPROT 26.6*  --   --   --   INR 2.4*  --   --   --   CREATININE 0.76 0.70  --  0.70  TROPONINIHS 36*  --  61*  --     Estimated Creatinine Clearance: 73.6 mL/min (by C-G formula based on SCr of 0.7 mg/dL).   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   Esophageal reflux 02/10/2015   Fatigue    HTN (hypertension)    Hyperglycemia    preDM    Hyperlipidemia    Insomnia    Low back pain radiating to both legs 01/16/2017   Measles as a child   Overweight(278.02)    Peripheral neuropathy 08/07/2019   Personal history of colonic polyps 10/27/2012   Follows with [facility]   Pneumonia    " walking"   Preventative health care    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   Wears glasses     Medications:  Scheduled:   arformoterol  15 mcg Nebulization  BID   budesonide  0.5 mg Nebulization BID   furosemide  60 mg Intravenous Q8H   insulin aspart  0-15 Units Subcutaneous Q4H   insulin glargine-yfgn  26 Units Subcutaneous Daily   linagliptin  5 mg Oral Daily   methylPREDNISolone (SOLU-MEDROL) injection  40 mg Intravenous Q12H   metoprolol succinate  12.5 mg Oral Daily   pregabalin  75 mg Oral BID   revefenacin  175 mcg Nebulization Daily   tamsulosin  0.4 mg Oral QPM   [START ON 08/22/2023] venlafaxine XR  150 mg Oral Q breakfast   Infusions:   albumin human     azithromycin 500 mg (08/29/23 0933)   dexmedetomidine (PRECEDEX) IV infusion      Assessment: Patient is a 19 yoM admitted on 2/11 with acute on chronic respiratory failure. Recent admission 1/16-2/2 for RSV, MSSA, stenotrophomonas pneumonia.  PMH is significant for atrial fibrillation, hx of PE/DVT anticoagulated PTA with warfarin.  Pharmacy consulted to dose warfarin.   Home warfarin dose: 5mg  daily except 7.5 mg on Tuesdays and Saturdays.  INR on admission: 2.4 CBC:  Hgb low/stable at 11.1 (near baseline), Plt  WNL No bleeding or complications reported.  Drug interactions: None major. Azithromycin, solumedrol  Goal of Therapy:  INR 2-3 Monitor platelets by anticoagulation protocol: Yes   Plan:  Warfarin 7.5 mg PO x 1. Daily PT/INR. Monitor for signs and symptoms of bleeding.   Lynann Beaver PharmD, BCPS [facility] main pharmacy [phone] 08/29/2023 10:26 AM

## 2023-08-29 NOTE — Progress Notes (Signed)
Pt remains on BIPAP at this time.

## 2023-08-30 ENCOUNTER — Inpatient Hospital Stay (HOSPITAL_COMMUNITY): Payer: Medicare Other

## 2023-08-30 DIAGNOSIS — I4891 Unspecified atrial fibrillation: Secondary | ICD-10-CM | POA: Diagnosis not present

## 2023-08-30 DIAGNOSIS — J9621 Acute and chronic respiratory failure with hypoxia: Secondary | ICD-10-CM | POA: Diagnosis not present

## 2023-08-30 DIAGNOSIS — E871 Hypo-osmolality and hyponatremia: Secondary | ICD-10-CM | POA: Diagnosis not present

## 2023-08-30 DIAGNOSIS — J80 Acute respiratory distress syndrome: Principal | ICD-10-CM

## 2023-08-30 DIAGNOSIS — Z7189 Other specified counseling: Secondary | ICD-10-CM | POA: Diagnosis not present

## 2023-08-30 DIAGNOSIS — J8410 Pulmonary fibrosis, unspecified: Secondary | ICD-10-CM

## 2023-08-30 DIAGNOSIS — J849 Interstitial pulmonary disease, unspecified: Secondary | ICD-10-CM | POA: Diagnosis not present

## 2023-08-30 DIAGNOSIS — Z515 Encounter for palliative care: Secondary | ICD-10-CM | POA: Diagnosis not present

## 2023-08-30 DIAGNOSIS — Z711 Person with feared health complaint in whom no diagnosis is made: Secondary | ICD-10-CM

## 2023-08-30 DIAGNOSIS — Z79899 Other long term (current) drug therapy: Secondary | ICD-10-CM | POA: Diagnosis not present

## 2023-08-30 LAB — COMPREHENSIVE METABOLIC PANEL
ALT: 11 U/L (ref 0–44)
AST: 38 U/L (ref 15–41)
Albumin: 3.9 g/dL (ref 3.5–5.0)
Alkaline Phosphatase: 77 U/L (ref 38–126)
Anion gap: 12 (ref 5–15)
BUN: 19 mg/dL (ref 8–23)
CO2: 23 mmol/L (ref 22–32)
Calcium: 8.6 mg/dL — ABNORMAL LOW (ref 8.9–10.3)
Chloride: 91 mmol/L — ABNORMAL LOW (ref 98–111)
Creatinine, Ser: 0.7 mg/dL (ref 0.61–1.24)
GFR, Estimated: >60 mL/min (ref >60)
Glucose, Bld: 177 mg/dL — ABNORMAL HIGH (ref 70–99)
Potassium: 4.4 mmol/L (ref 3.5–5.1)
Sodium: 126 mmol/L — ABNORMAL LOW (ref 135–145)
Total Bilirubin: 2 mg/dL — ABNORMAL HIGH (ref 0.0–1.2)
Total Protein: 6.4 g/dL — ABNORMAL LOW (ref 6.5–8.1)

## 2023-08-30 LAB — CBC
HCT: 28 % — ABNORMAL LOW (ref 39.0–52.0)
Hemoglobin: 9.2 g/dL — ABNORMAL LOW (ref 13.0–17.0)
MCH: 29.4 pg (ref 26.0–34.0)
MCHC: 32.9 g/dL (ref 30.0–36.0)
MCV: 89.5 fL (ref 80.0–100.0)
Platelets: 248 10*3/uL (ref 150–400)
RBC: 3.13 MIL/uL — ABNORMAL LOW (ref 4.22–5.81)
RDW: 16.2 % — ABNORMAL HIGH (ref 11.5–15.5)
WBC: 13.1 10*3/uL — ABNORMAL HIGH (ref 4.0–10.5)
nRBC: 0 % (ref 0.0–0.2)

## 2023-08-30 LAB — LEGIONELLA PNEUMOPHILA SEROGP 1 UR AG: L. pneumophila Serogp 1 Ur Ag: NEGATIVE

## 2023-08-30 LAB — PROTIME-INR
INR: 2.8 — ABNORMAL HIGH (ref 0.8–1.2)
Prothrombin Time: 29.4 s — ABNORMAL HIGH (ref 11.4–15.2)

## 2023-08-30 LAB — GLUCOSE, CAPILLARY: Glucose-Capillary: 177 mg/dL — ABNORMAL HIGH (ref 70–99)

## 2023-08-30 MED ORDER — LORAZEPAM 2 MG/ML IJ SOLN
0.5000 mg | INTRAMUSCULAR | Status: DC | PRN
  Administered 2023-08-30 (×2): 0.5 mg via INTRAVENOUS
  Filled 2023-08-30 (×2): qty 1

## 2023-08-30 MED ORDER — ALUM & MAG HYDROXIDE-SIMETH 200-200-20 MG/5ML PO SUSP: 30.0000 mL | Freq: Four times a day (QID) | ORAL | Status: DC | PRN

## 2023-08-30 MED ORDER — LORAZEPAM 2 MG/ML IJ SOLN: 1.0000 mg | INTRAMUSCULAR | Status: DC | PRN

## 2023-08-30 MED ORDER — NYSTATIN 100000 UNIT/GM EX POWD: Freq: Three times a day (TID) | CUTANEOUS | Status: DC | PRN

## 2023-08-30 MED ORDER — MORPHINE BOLUS VIA INFUSION
5.0000 mg | INTRAVENOUS | Status: DC | PRN
  Administered 2023-08-30 (×3): 5 mg via INTRAVENOUS

## 2023-08-30 MED ORDER — LORAZEPAM 2 MG/ML IJ SOLN
1.0000 mg | INTRAMUSCULAR | Status: DC | PRN
  Administered 2023-08-30: 1 mg via INTRAVENOUS
  Filled 2023-08-30: qty 1

## 2023-08-30 MED ORDER — SODIUM CHLORIDE 0.9% FLUSH
10.0000 mL | INTRAVENOUS | Status: DC | PRN
  Administered 2023-08-30: 10 mL via INTRAVENOUS

## 2023-08-30 MED ORDER — HALOPERIDOL LACTATE 5 MG/ML IJ SOLN: 0.5000 mg | INTRAMUSCULAR | Status: DC | PRN

## 2023-08-30 MED ORDER — HALOPERIDOL LACTATE 2 MG/ML PO CONC: 0.5000 mg | ORAL | Status: DC | PRN

## 2023-08-30 MED ORDER — HALOPERIDOL LACTATE 5 MG/ML IJ SOLN
2.0000 mg | INTRAMUSCULAR | Status: DC | PRN
Start: 2023-08-30 — End: 2023-08-30

## 2023-08-30 MED ORDER — DIPHENHYDRAMINE HCL 50 MG/ML IJ SOLN: 12.5000 mg | INTRAMUSCULAR | Status: DC | PRN

## 2023-08-30 MED ORDER — MORPHINE BOLUS VIA INFUSION
10.0000 mg | INTRAVENOUS | Status: DC | PRN
  Administered 2023-08-30 (×3): 10 mg via INTRAVENOUS

## 2023-08-30 MED ORDER — HYDROMORPHONE HCL 1 MG/ML IJ SOLN
1.0000 mg | INTRAMUSCULAR | Status: DC | PRN
  Administered 2023-08-30 (×4): 1 mg via INTRAVENOUS
  Filled 2023-08-30 (×4): qty 1

## 2023-08-30 MED ORDER — MORPHINE 100MG IN NS 100ML (1MG/ML) PREMIX INFUSION
10.0000 mg/h | INTRAVENOUS | Status: DC
  Administered 2023-08-30: 5 mg/h via INTRAVENOUS
  Filled 2023-08-30: qty 100

## 2023-08-30 MED ORDER — LORAZEPAM 2 MG/ML PO CONC
1.0000 mg | ORAL | Status: DC | PRN
  Filled 2023-08-30: qty 0.5

## 2023-08-30 MED ORDER — ATROPINE SULFATE 1 % OP SOLN: 4.0000 [drp] | OPHTHALMIC | Status: DC | PRN

## 2023-08-30 MED ORDER — HALOPERIDOL 1 MG PO TABS: 0.5000 mg | ORAL_TABLET | ORAL | Status: DC | PRN

## 2023-08-30 MED ORDER — POLYVINYL ALCOHOL 1.4 % OP SOLN: 1.0000 [drp] | Freq: Four times a day (QID) | OPHTHALMIC | Status: DC | PRN

## 2023-08-30 MED ORDER — LORAZEPAM 1 MG PO TABS: 1.0000 mg | ORAL_TABLET | ORAL | Status: DC | PRN

## 2023-08-30 MED ORDER — ALBUMIN HUMAN 25 % IV SOLN
INTRAVENOUS | Status: AC
Start: 2023-08-30 — End: 2023-08-30
  Administered 2023-08-30: 12.5 g
  Filled 2023-08-30: qty 100

## 2023-08-30 MED ORDER — FUROSEMIDE 10 MG/ML IJ SOLN: 60.0000 mg | Freq: Three times a day (TID) | INTRAMUSCULAR | Status: DC

## 2023-08-30 MED ORDER — MAGIC MOUTHWASH: 15.0000 mL | Freq: Four times a day (QID) | ORAL | Status: DC | PRN

## 2023-08-30 MED ORDER — MORPHINE 100MG IN NS 100ML (1MG/ML) PREMIX INFUSION: 5.0000 mg/h | INTRAVENOUS | Status: DC

## 2023-08-30 MED ORDER — GLYCOPYRROLATE 0.2 MG/ML IJ SOLN
0.2000 mg | INTRAMUSCULAR | Status: DC | PRN
  Administered 2023-08-30: 0.2 mg via INTRAVENOUS
  Filled 2023-08-30: qty 1

## 2023-08-30 MED ORDER — OXYBUTYNIN CHLORIDE 5 MG PO TABS: 2.5000 mg | ORAL_TABLET | Freq: Four times a day (QID) | ORAL | Status: DC | PRN

## 2023-08-31 ENCOUNTER — Inpatient Hospital Stay: Payer: Medicare Other | Admitting: Primary Care

## 2023-09-03 LAB — CULTURE, BLOOD (ROUTINE X 2)
Culture: NO GROWTH
Culture: NO GROWTH
Special Requests: ADEQUATE

## 2023-09-07 ENCOUNTER — Ambulatory Visit: Payer: Medicare Other | Admitting: Family Medicine

## 2023-09-16 NOTE — Progress Notes (Signed)
The patient passed away at 14:11. Two nurses witnessed death. MD notified.

## 2023-09-16 NOTE — Death Summary Note (Signed)
DEATH SUMMARY   Patient Details  Name: Luis Brown MRN: [id number] DOB: 1946-03-31 Luis Brown, Luis Lions, MD Admission/Discharge Information   Admit Date:  2023/09/20  Date of Death: Date of Death: 09/21/2023  Time of Death: Time of Death: 25-Sep-1409  Length of Stay: 1   Principle Cause of death: Acute respiratory failure  Hospital Diagnoses: Principal Problem:   Acute on chronic respiratory failure with hypoxia (HCC) Active Problems:   COPD with acute exacerbation (HCC)   Paroxysmal atrial fibrillation (HCC)   Hyponatremia   Thoracic degenerative disc disease   Essential hypertension   BPH (benign prostatic hyperplasia)   GERD (gastroesophageal reflux disease)   Spinal stenosis of lumbar region without neurogenic claudication   History of DVT (deep vein thrombosis)   Bronchiectasis with acute exacerbation (HCC)   Hyperlipidemia   Pulmonary fibrosis (HCC)   Hypochloremia   Insulin dependent type 2 diabetes mellitus (HCC)   Grade I diastolic dysfunction   Elevated troponin   Volume overload   Acute pulmonary edema (HCC)   DNR (do not resuscitate)   High risk medication use   Medication management   Counseling and coordination of care   Goals of care, counseling/discussion   Palliative care encounter   Concern about end of life   Hospital Course: Brief Narrative:  78 year old with history of acute pharyngitis, seasonal allergy, anxiety, BPH, herpes, degenerative disc disease, osteoarthritis, DM2, HTN, epidural abscess, HLD, peripheral neuropathy, testosterone deficiency recently discharged about 9 days ago after 17 days of admission for acute respiratory failure secondary to ILD and bronchiectasis exacerbation now presents back to the ER with similar complaints and findings.  Upon admission respiratory virus workup was negative.  Chest x-ray showed worsening pulmonary opacity.  Signs of possible fluid therefore received IV Lasix along with bronchodilators.  Had to be placed  on BiPAP, pulmonary and palliative care team was consulted.  Patient was transition to comfort care and eventually ended up passing away on Sep 21, 2023 at 2:11 PM   Assessment & Plan:  Principal Problem:   Acute on chronic respiratory failure with hypoxia (HCC) Active Problems:   COPD with acute exacerbation (HCC)   Paroxysmal atrial fibrillation (HCC)   Hyponatremia   Thoracic degenerative disc disease   Essential hypertension   BPH (benign prostatic hyperplasia)   GERD (gastroesophageal reflux disease)   Spinal stenosis of lumbar region without neurogenic claudication   History of DVT (deep vein thrombosis)   Bronchiectasis with acute exacerbation (HCC)   Hyperlipidemia   Pulmonary fibrosis (HCC)   Hypochloremia   Insulin dependent type 2 diabetes mellitus (HCC)   Grade I diastolic dysfunction   Elevated troponin   Volume overload   Acute pulmonary edema (HCC)   DNR (do not resuscitate)   High risk medication use   Medication management   Counseling and coordination of care   Goals of care, counseling/discussion   Palliative care encounter   Acute respiratory failure with hypoxia requiring BiPAP COPD/ILD exacerbation  Acute 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 Service: Hospitalist          The results of significant diagnostics from  this hospitalization (including imaging, microbiology, ancillary and laboratory) are listed below for reference.   Significant Diagnostic Studies: DG CHEST PORT 1 VIEW Result Date: 15-Sep-2023 CLINICAL DATA:  Hypoxemia EXAM: PORTABLE CHEST 1  VIEW COMPARISON:  08/29/2023 FINDINGS: Severe diffuse bilateral airspace disease, worsening since prior study. Heart and mediastinal contours normal. Suspect small right pleural effusion. No pneumothorax. No acute bony abnormality. IMPRESSION: Diffuse severe bilateral airspace disease, worsening since prior study. Suspect small right effusion. Electronically Signed   By: Charlett Nose M.D.   On: 2023/09/15 01:20   DG Chest Portable 1 View Result Date: 08/29/2023 CLINICAL DATA:  Hypoxia EXAM: PORTABLE CHEST 1 VIEW COMPARISON:  08/12/2023 FINDINGS: Generalized interstitial and airspace opacity is progressed. Lung volumes remain low. Normal heart size with stable mediastinal contours. No visible effusion or pneumothorax. Extensive thoracolumbar fusion with fractured rods at the lower thoracic level, unchanged. There is indication of ordered thoracic and lumbar MR imaging. Moderate gaseous distention of the stomach. IMPRESSION: Generalized pulmonary opacification is progressed from prior, symmetry favoring edema. There may be underlying chronic interstitial lung disease or infection. Electronically Signed   By: Tiburcio Pea M.D.   On: 08/29/2023 05:56   DG Chest 1 View Result Date: 08/12/2023 CLINICAL DATA:  Hypoxia. Cough and congestion. Worsening shortness of breath. EXAM: CHEST  1 VIEW COMPARISON:  08/07/2023 FINDINGS: Lung volumes are low. Improving interstitial opacities, greatest residual in the right greater than left upper lobes and left lung base. No new airspace disease. Stable heart size and mediastinal contours. No pneumothorax or significant pleural effusion. Thoracolumbar fusion hardware. IMPRESSION: Improving interstitial opacities, greatest residual in the upper lung zones and left lung base. No new abnormalities. Electronically Signed   By: Narda Rutherford M.D.   On: 08/12/2023 16:50   ECHOCARDIOGRAM COMPLETE Result Date: 08/08/2023    ECHOCARDIOGRAM REPORT   Patient Name:   Luis Brown  Date of Exam: 08/08/2023 Medical Rec #:  [id number]        Height:       69.0 in Accession #:    [id number]       Weight:       198.4 lb Date of Birth:  1945/08/16         BSA:          2.059 m Patient Age:    77 years         BP:           204/65 mmHg Patient Gender: M                HR:           104 bpm. Exam Location:  Inpatient Procedure: 2D Echo, Cardiac Doppler and Color Doppler Indications:    Acute respiratory distress  History:        Patient has prior history of Echocardiogram examinations, most                 recent 07/20/2021. COPD, Arrythmias:Tachycardia and Atrial                 Fibrillation, Signs/Symptoms:Fatigue and Shortness of Breath;                 Risk Factors:Hypertension and Dyslipidemia.  Sonographer:    Vern Claude Referring Phys: [id number] PETER E BABCOCK IMPRESSIONS  1. Left ventricular ejection fraction, by estimation, is 60 to 65%. The left ventricle has normal function. The left ventricle has no regional wall motion abnormalities. Left ventricular diastolic parameters  are consistent with Grade I diastolic dysfunction (impaired relaxation).  2. Right ventricular systolic function is normal. The right ventricular size is normal. Tricuspid regurgitation signal is inadequate for assessing PA pressure.  3. The mitral valve is normal in structure. No evidence of mitral valve regurgitation. No evidence of mitral stenosis.  4. The aortic valve is normal in structure. Aortic valve regurgitation is not visualized. No aortic stenosis is present.  5. The inferior vena cava is normal in size with greater than 50% respiratory variability, suggesting right atrial pressure of 3 mmHg. FINDINGS  Left Ventricle: Left ventricular ejection fraction, by estimation, is 60 to 65%. The left ventricle has normal function. The left ventricle has no regional wall motion abnormalities. The left ventricular internal cavity size was normal in size. There is  no left ventricular hypertrophy. Left ventricular diastolic  parameters are consistent with Grade I diastolic dysfunction (impaired relaxation). Normal left ventricular filling pressure. Right Ventricle: The right ventricular size is normal. No increase in right ventricular wall thickness. Right ventricular systolic function is normal. Tricuspid regurgitation signal is inadequate for assessing PA pressure. Left Atrium: Left atrial size was normal in size. Right Atrium: Right atrial size was normal in size. Pericardium: There is no evidence of pericardial effusion. Mitral Valve: The mitral valve is normal in structure. No evidence of mitral valve regurgitation. No evidence of mitral valve stenosis. MV peak gradient, 6.7 mmHg. The mean mitral valve gradient is 3.0 mmHg. Tricuspid Valve: The tricuspid valve is normal in structure. Tricuspid valve regurgitation is not demonstrated. No evidence of tricuspid stenosis. Aortic Valve: The aortic valve is normal in structure. Aortic valve regurgitation is not visualized. No aortic stenosis is present. Aortic valve mean gradient measures 5.0 mmHg. Aortic valve peak gradient measures 10.8 mmHg. Aortic valve area, by VTI measures 4.08 cm. Pulmonic Valve: The pulmonic valve was normal in structure. Pulmonic valve regurgitation is not visualized. No evidence of pulmonic stenosis. Aorta: The aortic root is normal in size and structure. Venous: The inferior vena cava is normal in size with greater than 50% respiratory variability, suggesting right atrial pressure of 3 mmHg. IAS/Shunts: No atrial level shunt detected by color flow Doppler.  LEFT VENTRICLE PLAX 2D LVIDd:         4.20 cm      Diastology LVIDs:         2.80 cm      LV e' medial:    11.30 cm/s LV PW:         0.80 cm      LV E/e' medial:  7.2 LV IVS:        1.10 cm      LV e' lateral:   9.68 cm/s LVOT diam:     2.30 cm      LV E/e' lateral: 8.4 LV SV:         91 LV SV Index:   44 LVOT Area:     4.15 cm  LV Volumes (MOD) LV vol d, MOD A2C: 102.0 ml LV vol d, MOD A4C: 143.0 ml LV  vol s, MOD A2C: 32.8 ml LV vol s, MOD A4C: 60.5 ml LV SV MOD A2C:     69.2 ml LV SV MOD A4C:     143.0 ml LV SV MOD BP:      77.4 ml RIGHT VENTRICLE             IVC RV Basal diam:  3.50 cm     IVC diam: 1.30 cm  RV S prime:     23.40 cm/s TAPSE (M-mode): 3.3 cm LEFT ATRIUM             Index        RIGHT ATRIUM           Index LA diam:        3.10 cm 1.51 cm/m   RA Area:     10.60 cm LA Vol (A2C):   22.6 ml 10.98 ml/m  RA Volume:   20.50 ml  9.96 ml/m LA Vol (A4C):   20.2 ml 9.81 ml/m LA Biplane Vol: 22.5 ml 10.93 ml/m  AORTIC VALVE                     PULMONIC VALVE AV Area (Vmax):    4.21 cm      PV Vmax:       1.44 m/s AV Area (Vmean):   4.00 cm      PV Peak grad:  8.2 mmHg AV Area (VTI):     4.08 cm AV Vmax:           164.00 cm/s AV Vmean:          107.000 cm/s AV VTI:            0.223 m AV Peak Grad:      10.8 mmHg AV Mean Grad:      5.0 mmHg LVOT Vmax:         166.00 cm/s LVOT Vmean:        103.000 cm/s LVOT VTI:          0.219 m LVOT/AV VTI ratio: 0.98  AORTA Ao Root diam: 3.10 cm Ao Asc diam:  3.10 cm MITRAL VALVE MV Area (PHT): 6.83 cm    SHUNTS MV Area VTI:   5.72 cm    Systemic VTI:  0.22 m MV Peak grad:  6.7 mmHg    Systemic Diam: 2.30 cm MV Mean grad:  3.0 mmHg MV Vmax:       1.29 m/s MV Vmean:      77.7 cm/s MV Decel Time: 111 msec MV E velocity: 81.50 cm/s MV A velocity: 99.90 cm/s MV E/A ratio:  0.82 Mihai Croitoru MD Electronically signed by Thurmon Fair MD Signature Date/Time: 08/08/2023/3:40:47 PM    Final    DG Chest Port 1 View Result Date: 08/07/2023 CLINICAL DATA:  Respiratory compromise EXAM: PORTABLE CHEST 1 VIEW COMPARISON:  08/03/2023 FINDINGS: Progressive diffuse airspace infiltrate, asymmetrically more severe within the right mid lung zone, has progressed significantly since prior examination in keeping with diffuse pneumonic infiltrate or asymmetric alveolar pulmonary edema. No pneumothorax or pleural effusion. Cardiac size is within normal limits. Fractured thoracolumbar  spinal fusion hardware again noted. IMPRESSION: 1. Progressive diffuse airspace infiltrate in keeping with multifocal infection and a or asymmetric alveolar pulmonary edema. Electronically Signed   By: Helyn Numbers M.D.   On: 08/07/2023 00:59   DG Chest Portable 1 View Result Date: 08/03/2023 CLINICAL DATA:  Shortness of breath EXAM: PORTABLE CHEST 1 VIEW COMPARISON:  08/02/2023 FINDINGS: Multifocal patchy opacities, right upper lobe and left lower lobe predominant, new. This appearance suggests multifocal pneumonia. No pleural effusion or pneumothorax. The heart is normal in size. Lumbar spine fixation hardware with fractured spinal fixation rods, unchanged. IMPRESSION: Multifocal pneumonia, new. Electronically Signed   By: Charline Bills M.D.   On: 08/03/2023 20:25   DG Chest 2 View Result Date: 08/02/2023 CLINICAL DATA:  Cough, shortness of breath  EXAM: CHEST - 2 VIEW COMPARISON:  05/19/2023 FINDINGS: The heart size and mediastinal contours are within normal limits. Chronically coarsened interstitial markings bilaterally. No focal airspace consolidation, pleural effusion, or pneumothorax. Thoracolumbar fusion with chronically fractured rods bilaterally. Abandoned screw fragments present above the fused levels, unchanged. IMPRESSION: Chronically coarsened interstitial markings bilaterally. No superimposed acute cardiopulmonary findings. Electronically Signed   By: Duanne Guess D.O.   On: 08/02/2023 13:36    Microbiology: Recent Results (from the past 240 hours)  Culture, blood (routine x 2)     Status: None (Preliminary result)   Collection Time: 08/29/23  5:11 AM   Specimen: BLOOD  Result Value Ref Range Status   Specimen Description   Final    BLOOD LEFT ANTECUBITAL Performed at [facility], [address] [place], [place] [address]    Special Requests   Final    BOTTLES DRAWN AEROBIC AND ANAEROBIC Blood Culture results may not be optimal due to an inadequate  volume of blood received in culture bottles Performed at [facility], [address], [place], [place] [address]    Culture   Final    NO GROWTH < 24 HOURS Performed at [facility], [address] [place], [place] [address]    Report Status PENDING  Incomplete  Resp panel by RT-PCR (RSV, Flu A&B, Covid) Anterior Nasal Swab     Status: None   Collection Time: 08/29/23  8:32 AM   Specimen: Anterior Nasal Swab  Result Value Ref Range Status   SARS Coronavirus 2 by RT PCR NEGATIVE NEGATIVE Final    Comment: (NOTE) SARS-CoV-2 target nucleic acids are NOT DETECTED.  The SARS-CoV-2 RNA is generally detectable in upper respiratory specimens during the acute phase of infection. The lowest concentration of SARS-CoV-2 viral copies this assay can detect is 138 copies/mL. A negative result does not preclude SARS-Cov-2 infection and should not be used as the sole basis for treatment or other patient management decisions. A negative result may occur with  improper specimen collection/handling, submission of specimen other than nasopharyngeal swab, presence of viral mutation(s) within the areas targeted by this assay, and inadequate number of viral copies(<138 copies/mL). A negative result must be combined with clinical observations, patient history, and epidemiological information. The expected result is Negative.  Fact Sheet for Patients:  BloggerCourse.com  Fact Sheet for Healthcare Providers:  SeriousBroker.it  This test is no t yet approved or cleared by the [place] FDA and  has been authorized for detection and/or diagnosis of SARS-CoV-2 by FDA under an Emergency Use Authorization (EUA). This EUA will remain  in effect (meaning this test can be used) for the duration of the COVID-19 declaration under Section 564(b)(1) of the Act, 21 U.S.C.section 360bbb-3(b)(1), unless the authorization is terminated  or  revoked sooner.       Influenza A by PCR NEGATIVE NEGATIVE Final   Influenza B by PCR NEGATIVE NEGATIVE Final    Comment: (NOTE) The Xpert Xpress SARS-CoV-2/FLU/RSV plus assay is intended as an aid in the diagnosis of influenza from Nasopharyngeal swab specimens and should not be used as a sole basis for treatment. Nasal washings and aspirates are unacceptable for Xpert Xpress SARS-CoV-2/FLU/RSV testing.  Fact Sheet for Patients: BloggerCourse.com  Fact Sheet for Healthcare Providers: SeriousBroker.it  This test is not yet approved or cleared by the [place] FDA and has been authorized for detection and/or diagnosis of SARS-CoV-2 by FDA under an Emergency Use Authorization (EUA). This EUA  will remain in effect (meaning this test can be used) for the duration of the COVID-19 declaration under Section 564(b)(1) of the Act, 21 U.S.C. section 360bbb-3(b)(1), unless the authorization is terminated or revoked.     Resp Syncytial Virus by PCR NEGATIVE NEGATIVE Final    Comment: (NOTE) Fact Sheet for Patients: BloggerCourse.com  Fact Sheet for Healthcare Providers: SeriousBroker.it  This test is not yet approved or cleared by the [place] FDA and has been authorized for detection and/or diagnosis of SARS-CoV-2 by FDA under an Emergency Use Authorization (EUA). This EUA will remain in effect (meaning this test can be used) for the duration of the COVID-19 declaration under Section 564(b)(1) of the Act, 21 U.S.C. section 360bbb-3(b)(1), unless the authorization is terminated or revoked.  Performed at [facility], [address], [place], [place] [address]   Respiratory (~20 pathogens) panel by PCR     Status: None   Collection Time: 08/29/23  8:32 AM   Specimen: Nasopharyngeal Swab; Respiratory  Result Value Ref Range Status   Adenovirus NOT DETECTED  NOT DETECTED Final   Coronavirus 229E NOT DETECTED NOT DETECTED Final    Comment: (NOTE) The Coronavirus on the Respiratory Panel, DOES NOT test for the novel  Coronavirus (2019 nCoV)    Coronavirus HKU1 NOT DETECTED NOT DETECTED Final   Coronavirus NL63 NOT DETECTED NOT DETECTED Final   Coronavirus OC43 NOT DETECTED NOT DETECTED Final   Metapneumovirus NOT DETECTED NOT DETECTED Final   Rhinovirus / Enterovirus NOT DETECTED NOT DETECTED Final   Influenza A NOT DETECTED NOT DETECTED Final   Influenza B NOT DETECTED NOT DETECTED Final   Parainfluenza Virus 1 NOT DETECTED NOT DETECTED Final   Parainfluenza Virus 2 NOT DETECTED NOT DETECTED Final   Parainfluenza Virus 3 NOT DETECTED NOT DETECTED Final   Parainfluenza Virus 4 NOT DETECTED NOT DETECTED Final   Respiratory Syncytial Virus NOT DETECTED NOT DETECTED Final   Bordetella pertussis NOT DETECTED NOT DETECTED Final   Bordetella Parapertussis NOT DETECTED NOT DETECTED Final   Chlamydophila pneumoniae NOT DETECTED NOT DETECTED Final   Mycoplasma pneumoniae NOT DETECTED NOT DETECTED Final    Comment: Performed at [facility], [address], [place], [place] [address]  Culture, blood (routine x 2)     Status: None (Preliminary result)   Collection Time: 08/29/23  6:03 PM   Specimen: BLOOD  Result Value Ref Range Status   Specimen Description   Final    BLOOD BLOOD RIGHT HAND Performed at [facility], [address], [place], [place] [address]    Special Requests   Final    BOTTLES DRAWN AEROBIC AND ANAEROBIC Blood Culture adequate volume Performed at [facility], [address] [place], [place] [address]    Culture   Final    NO GROWTH < 12 HOURS Performed at [facility], [address], [place], [place] [address]    Report Status PENDING  Incomplete    Time spent: 25 minutes  Signed: Miguel Rota, MD 09/11/2023

## 2023-09-16 NOTE — Progress Notes (Signed)
Returned to patient's family as patient transpired, alerted nurse - awaiting honor-bridge. Provided support.

## 2023-09-16 NOTE — Progress Notes (Signed)
PROGRESS NOTE    Luis Brown  WGN:[id number] DOB: 1946-05-15 DOA: 08/29/2023 PCP: Bradd Canary, MD    Brief Narrative:  78 year old with history of acute pharyngitis, seasonal allergy, anxiety, BPH, herpes, degenerative disc disease, osteoarthritis, DM2, HTN, epidural abscess, HLD, peripheral neuropathy, testosterone deficiency recently discharged about 9 days ago after 17 days of admission for acute respiratory failure secondary to ILD and bronchiectasis exacerbation now presents back to the ER with similar complaints and findings.  Upon admission respiratory virus workup was negative.  Chest x-ray showed worsening pulmonary opacity.  Signs of possible fluid therefore received IV Lasix along with bronchodilators.  Had to be placed on BiPAP, pulmonary and palliative care team was consulted.  Patient is now comfort care.    Assessment & Plan:  Principal Problem:   Acute on chronic respiratory failure with hypoxia (HCC) Active Problems:   COPD with acute exacerbation (HCC)   Paroxysmal atrial fibrillation (HCC)   Hyponatremia   Thoracic degenerative disc disease   Essential hypertension   BPH (benign prostatic hyperplasia)   GERD (gastroesophageal reflux disease)   Spinal stenosis of lumbar region without neurogenic claudication   History of DVT (deep vein thrombosis)   Bronchiectasis with acute exacerbation (HCC)   Hyperlipidemia   Pulmonary fibrosis (HCC)   Hypochloremia   Insulin dependent type 2 diabetes mellitus (HCC)   Grade I diastolic dysfunction   Elevated troponin   Volume overload   Acute pulmonary edema (HCC)   DNR (do not resuscitate)   High risk medication use   Medication management   Counseling and coordination of care   Goals of care, counseling/discussion   Palliative care encounter   Acute respiratory failure with hypoxia requiring BiPAP COPD/ILD exacerbation  Acute congestive heart failure with preserved ejection fraction  Goals of  care  Elevated troponin  Paroxysmal A-fib  Hyponatremia  Essential hypertension  BPH  GERD  Thoracic/lumbar disc degenerative disease Peripheral neuropathy  History of DVT  Hyperlipidemia  DM 2, uncontrolled due to hyperglycemia from steroid use  DISCUSSED CARE WITH HIS FAMILY AT BEDSIDE. PATIENT IS NOW COMFORT CARE, ANTICIPATE INHOSPITAL DEATH.  START MORPHINE DRIP.       Subjective: Resting on precedex on bipap All family at bedside, wanting him to be comfort care.    Examination:  General exam: lethargic.  Respiratory system: diffuse rhonchi Cardiovascular system: S1 & S2 heard, RRR. No JVD, murmurs, rubs, gallops or clicks. No pedal edema. Gastrointestinal system: Abdomen is nondistended, soft and nontender. No organomegaly or masses felt. Normal bowel sounds heard. Central nervous system: unable to assess.  Psychiatry: unable to assess            Pressure Injury 08/12/23 Buttocks Right;Posterior;Medial Stage 2 -  Partial thickness loss of dermis presenting as a shallow open injury with a red, pink wound bed without slough. 2.54 centimeters pink/red (Active)  08/12/23 2200  Location: Buttocks  Location Orientation: Right;Posterior;Medial  Staging: Stage 2 -  Partial thickness loss of dermis presenting as a shallow open injury with a red, pink wound bed without slough.  Wound Description (Comments): 2.54 centimeters pink/red  Present on Admission: No     Diet Orders (From admission, onward)     Start     Ordered   08/29/23 0818  Diet heart healthy/carb modified Room service appropriate? Yes; Fluid consistency: Thin  Diet effective now       Question Answer Comment  Diet-HS Snack? Nothing   Room service appropriate? Yes   Fluid  consistency: Thin      08/29/23 0818            Objective: Vitals:   08/20/2023 0700 08/27/2023 0703 08/25/2023 0800 09/08/2023 0900  BP: 105/64  (!) 95/52 (!) 96/55  Pulse: (!) 111 (!) 111 94 87  Resp: 20 20 17 17    Temp:      TempSrc:      SpO2: 100% 100% 99% 100%  Weight:      Height:        Intake/Output Summary (Last 24 hours) at 08/20/2023 0945 Last data filed at 08/25/2023 0800 Gross per 24 hour  Intake 1679.45 ml  Output 2720 ml  Net -1040.55 ml   Filed Weights   08/29/23 1748 08/20/2023 0452  Weight: 83.9 kg 84.6 kg    Scheduled Meds:  arformoterol  15 mcg Nebulization BID   budesonide  0.5 mg Nebulization BID   Chlorhexidine Gluconate Cloth  6 each Topical Daily   mouth rinse  15 mL Mouth Rinse 4 times per day   Continuous Infusions:  dexmedetomidine (PRECEDEX) IV infusion 1.1 mcg/kg/hr (08/27/2023 0800)   morphine      Nutritional status     Body mass index is 27.54 kg/m.  Data Reviewed:   CBC: Recent Labs  Lab 08/29/23 0511 08/29/23 0523 08/29/23 0838 08/22/2023 0318  WBC 17.9*  --   --  13.1*  NEUTROABS 16.5*  --   --   --   HGB 11.1* 12.2* 11.9* 9.2*  HCT 34.7* 36.0* 35.0* 28.0*  MCV 89.9  --   --  89.5  PLT 307  --   --  248   Basic Metabolic Panel: Recent Labs  Lab 08/29/23 0511 08/29/23 0523 08/29/23 0838 08/29/23 2056 09/04/2023 0318  NA 122* 121* 122* 129* 126*  K 3.9 4.1 4.4 3.6 4.4  CL 87* 89* 88* 90* 91*  CO2 22  --   --  25 23  GLUCOSE 196* 197* 251* 196* 177*  BUN 13 13 13 15 19   CREATININE 0.76 0.70 0.70 0.70 0.70  CALCIUM 8.6*  --   --  9.1 8.6*  MG  --   --   --  2.0  --    GFR: Estimated Creatinine Clearance: 73.6 mL/min (by C-G formula based on SCr of 0.7 mg/dL). Liver Function Tests: Recent Labs  Lab 08/29/23 0511 09/11/2023 0318  AST 28 38  ALT 12 11  ALKPHOS 72 77  BILITOT 1.9* 2.0*  PROT 6.2* 6.4*  ALBUMIN 3.3* 3.9   No results for input(s): "LIPASE", "AMYLASE" in the last 168 hours. No results for input(s): "AMMONIA" in the last 168 hours. Coagulation Profile: Recent Labs  Lab 08/29/23 0511 08/25/2023 0318  INR 2.4* 2.8*   Cardiac Enzymes: No results for input(s): "CKTOTAL", "CKMB", "CKMBINDEX", "TROPONINI" in the  last 168 hours. BNP (last 3 results) Recent Labs    05/31/23 1534  PROBNP 40.0   HbA1C: No results for input(s): "HGBA1C" in the last 72 hours. CBG: Recent Labs  Lab 08/29/23 1143 08/29/23 1619 08/29/23 2020 08/29/23 2351 09/07/2023 0352  GLUCAP 230* 238* 194* 179* 177*   Lipid Profile: No results for input(s): "CHOL", "HDL", "LDLCALC", "TRIG", "CHOLHDL", "LDLDIRECT" in the last 72 hours. Thyroid Function Tests: No results for input(s): "TSH", "T4TOTAL", "FREET4", "T3FREE", "THYROIDAB" in the last 72 hours. Anemia Panel: No results for input(s): "VITAMINB12", "FOLATE", "FERRITIN", "TIBC", "IRON", "RETICCTPCT" in the last 72 hours. Sepsis Labs: Recent Labs  Lab 08/29/23 0518 08/29/23 7846 08/29/23  1200  PROCALCITON  --  2.41  --   LATICACIDVEN 2.5*  --  1.5    Recent Results (from the past 240 hours)  Culture, blood (routine x 2)     Status: None (Preliminary result)   Collection Time: 08/29/23  5:11 AM   Specimen: BLOOD  Result Value Ref Range Status   Specimen Description   Final    BLOOD LEFT ANTECUBITAL Performed at [facility], [address], [place], [place] [address]    Special Requests   Final    BOTTLES DRAWN AEROBIC AND ANAEROBIC Blood Culture results may not be optimal due to an inadequate volume of blood received in culture bottles Performed at [facility], [address] [place], [place] [address]    Culture   Final    NO GROWTH < 24 HOURS Performed at [facility], [address] [place], [place] [address]    Report Status PENDING  Incomplete  Resp panel by RT-PCR (RSV, Flu A&B, Covid) Anterior Nasal Swab     Status: None   Collection Time: 08/29/23  8:32 AM   Specimen: Anterior Nasal Swab  Result Value Ref Range Status   SARS Coronavirus 2 by RT PCR NEGATIVE NEGATIVE Final    Comment: (NOTE) SARS-CoV-2 target nucleic acids are NOT DETECTED.  The SARS-CoV-2 RNA is generally detectable in upper  respiratory specimens during the acute phase of infection. The lowest concentration of SARS-CoV-2 viral copies this assay can detect is 138 copies/mL. A negative result does not preclude SARS-Cov-2 infection and should not be used as the sole basis for treatment or other patient management decisions. A negative result may occur with  improper specimen collection/handling, submission of specimen other than nasopharyngeal swab, presence of viral mutation(s) within the areas targeted by this assay, and inadequate number of viral copies(<138 copies/mL). A negative result must be combined with clinical observations, patient history, and epidemiological information. The expected result is Negative.  Fact Sheet for Patients:  BloggerCourse.com  Fact Sheet for Healthcare Providers:  SeriousBroker.it  This test is no t yet approved or cleared by the [place] FDA and  has been authorized for detection and/or diagnosis of SARS-CoV-2 by FDA under an Emergency Use Authorization (EUA). This EUA will remain  in effect (meaning this test can be used) for the duration of the COVID-19 declaration under Section 564(b)(1) of the Act, 21 U.S.C.section 360bbb-3(b)(1), unless the authorization is terminated  or revoked sooner.       Influenza A by PCR NEGATIVE NEGATIVE Final   Influenza B by PCR NEGATIVE NEGATIVE Final    Comment: (NOTE) The Xpert Xpress SARS-CoV-2/FLU/RSV plus assay is intended as an aid in the diagnosis of influenza from Nasopharyngeal swab specimens and should not be used as a sole basis for treatment. Nasal washings and aspirates are unacceptable for Xpert Xpress SARS-CoV-2/FLU/RSV testing.  Fact Sheet for Patients: BloggerCourse.com  Fact Sheet for Healthcare Providers: SeriousBroker.it  This test is not yet approved or cleared by the [place] FDA and has been  authorized for detection and/or diagnosis of SARS-CoV-2 by FDA under an Emergency Use Authorization (EUA). This EUA will remain in effect (meaning this test can be used) for the duration of the COVID-19 declaration under Section 564(b)(1) of the Act, 21 U.S.C. section 360bbb-3(b)(1), unless the authorization is terminated or revoked.     Resp Syncytial Virus by PCR NEGATIVE NEGATIVE Final    Comment: (NOTE) Fact Sheet for Patients: BloggerCourse.com  Fact Sheet for Healthcare Providers: SeriousBroker.it  This test is not yet approved or cleared by the [place] FDA and has been authorized for detection and/or diagnosis of SARS-CoV-2 by FDA under an Emergency Use Authorization (EUA). This EUA will remain in effect (meaning this test can be used) for the duration of the COVID-19 declaration under Section 564(b)(1) of the Act, 21 U.S.C. section 360bbb-3(b)(1), unless the authorization is terminated or revoked.  Performed at [facility], [address], [place], [place] [address]   Respiratory (~20 pathogens) panel by PCR     Status: None   Collection Time: 08/29/23  8:32 AM   Specimen: Nasopharyngeal Swab; Respiratory  Result Value Ref Range Status   Adenovirus NOT DETECTED NOT DETECTED Final   Coronavirus 229E NOT DETECTED NOT DETECTED Final    Comment: (NOTE) The Coronavirus on the Respiratory Panel, DOES NOT test for the novel  Coronavirus (2019 nCoV)    Coronavirus HKU1 NOT DETECTED NOT DETECTED Final   Coronavirus NL63 NOT DETECTED NOT DETECTED Final   Coronavirus OC43 NOT DETECTED NOT DETECTED Final   Metapneumovirus NOT DETECTED NOT DETECTED Final   Rhinovirus / Enterovirus NOT DETECTED NOT DETECTED Final   Influenza A NOT DETECTED NOT DETECTED Final   Influenza B NOT DETECTED NOT DETECTED Final   Parainfluenza Virus 1 NOT DETECTED NOT DETECTED Final   Parainfluenza Virus 2 NOT DETECTED NOT  DETECTED Final   Parainfluenza Virus 3 NOT DETECTED NOT DETECTED Final   Parainfluenza Virus 4 NOT DETECTED NOT DETECTED Final   Respiratory Syncytial Virus NOT DETECTED NOT DETECTED Final   Bordetella pertussis NOT DETECTED NOT DETECTED Final   Bordetella Parapertussis NOT DETECTED NOT DETECTED Final   Chlamydophila pneumoniae NOT DETECTED NOT DETECTED Final   Mycoplasma pneumoniae NOT DETECTED NOT DETECTED Final    Comment: Performed at [facility], [address], [place], [place] [address]  Culture, blood (routine x 2)     Status: None (Preliminary result)   Collection Time: 08/29/23  6:03 PM   Specimen: BLOOD  Result Value Ref Range Status   Specimen Description   Final    BLOOD BLOOD RIGHT HAND Performed at [facility], [address] [place], [place] [address]    Special Requests   Final    BOTTLES DRAWN AEROBIC AND ANAEROBIC Blood Culture adequate volume Performed at [facility], [address], [place], [place] [address]    Culture   Final    NO GROWTH < 12 HOURS Performed at [facility], [address] [place], [place] [address]    Report Status PENDING  Incomplete         Radiology Studies: DG CHEST PORT 1 VIEW Result Date: 09/06/2023 CLINICAL DATA:  Hypoxemia EXAM: PORTABLE CHEST 1 VIEW COMPARISON:  08/29/2023 FINDINGS: Severe diffuse bilateral airspace disease, worsening since prior study. Heart and mediastinal contours normal. Suspect small right pleural effusion. No pneumothorax. No acute bony abnormality. IMPRESSION: Diffuse severe bilateral airspace disease, worsening since prior study. Suspect small right effusion. Electronically Signed   By: Charlett Nose M.D.   On: 09/09/2023 01:20   DG Chest Portable 1 View Result Date: 08/29/2023 CLINICAL DATA:  Hypoxia EXAM: PORTABLE CHEST 1 VIEW COMPARISON:  08/12/2023 FINDINGS: Generalized interstitial and airspace opacity is progressed. Lung volumes remain low.  Normal heart size with stable mediastinal contours. No visible effusion or pneumothorax. Extensive thoracolumbar fusion with fractured rods at the lower thoracic level, unchanged. There  is indication of ordered thoracic and lumbar MR imaging. Moderate gaseous distention of the stomach. IMPRESSION: Generalized pulmonary opacification is progressed from prior, symmetry favoring edema. There may be underlying chronic interstitial lung disease or infection. Electronically Signed   By: Tiburcio Pea M.D.   On: 08/29/2023 05:56           LOS: 1 day   Time spent= 35 mins    Miguel Rota, MD [facility]  If 7PM-7AM, please contact night-coverage  09/15/2023, 9:45 AM

## 2023-09-16 NOTE — Progress Notes (Signed)
   09/15/2023 1328  Spiritual Encounters  Type of Visit Initial  Care provided to: Pt and family  Reason for visit End-of-life  OnCall Visit No   Visited with family as patient EOL is imminent. Spoke with spouse and brother-in-law, who is also a priest. Per family clergy person has already visited and brother-in-law will be giving the patient last rite. Advised family of chaplain's availability if need be.   Family thanked chaplain for visiting and reported they are "alright" for now.

## 2023-09-16 NOTE — Progress Notes (Signed)
Daily Progress Note   Patient Name: Luis Brown       Date: 08/23/2023 DOB: 1945-12-17  Age: 78 y.o. MRN#: [id number] Attending Physician: Miguel Rota, MD Primary Care Physician: Bradd Canary, MD Admit Date: 08/29/2023 Length of Stay: 1 day  Reason for Consultation/Follow-up: Establishing goals of care  Subjective:   CC: Patient appears to be dying despite respiratory support.  Following up regarding complex medical decision making.  Subjective:  Reviewed EMR.  Discussed care with hospitalist and PCCM provider.  Patient appears to be dying despite respiratory support.  This has been discussed with patient's family and patient is transition to comfort focused care.  Anticipate hospital death at this time.  Multiple family members presenting to bedside at time of presentation.  Discussed care with RN for updates.  Awaiting a couple more family members to present before initiating comfort focused medications.  Discussed with RN to provide adjustment to comfort process medications for symptom management.  Also discontinued medications and interventions that were no longer focused on comfort.  Continue to discuss care with RN throughout the day to adjust medications as necessary.  Objective:   Vital Signs:  BP (!) 95/52   Pulse 94   Temp 98.3 F (36.8 C) (Axillary)   Resp 17   Ht 5\' 9"  (1.753 m)   Wt 84.6 kg   SpO2 99%   BMI 27.54 kg/m   Assessment & Plan:   Assessment: Patient is an 78 year old male with past medical history of multiple lung issues including RSV/MSSA/pneumonia in the acute setting on chronic ILD, chronic back pain, diabetes mellitus type 2, hypertension, GERD, epidural abscess, and peripheral neuropathy who was brought to the ED on 08/29/2023 for worsening shortness of breath.  Patient had just recently had 17-day admission for acute respiratory failure in the setting of ILD and bronchiectasis exacerbation.  Since admission, patient has been requiring BiPAP  support for work of breathing.  Pulmonology consulted for recommendations.  Palliative medicine team consulted to assist with complex medical decision making.   Recommendations/Plan: # Complex medical decision making/goals of care:  - Patient's medical status continued to deteriorate despite medical interventions.  Patient now transition to comfort focused care with anticipated Hospital death.  Multiple family members at bedside are appropriately grieving.  -  Code Status: Do not attempt resuscitation (DNR) - Comfort care Prognosis: Hours - Days  # Symptom management Patient is receiving these palliative interventions for symptom management with an intent to improve quality of life.     -Pain/Dyspnea, acute in the setting of end-of-life care      -Appropriately adjusted IV morphine titration for continuous infusion multiple times during the day based on patient's elevating symptom burden.  Continue to provide adjustment range based on IV bolus requirements.  Also adjusted patient's IV morphine bolus multiple times based on patient's worsening symptom burden and respiratory distress.   -Appropriately adjusted patient's IV Precedex titration to allow for comfort focused care at the end of life.                 -Anxiety/agitation, in the setting of end-of-life care                               -Start IV Ativan 1 mg every 2 hours as needed                               -  Start IV Haldol 2 mg every 4 hours as needed                  -Secretions, in the setting of end-of-life care                               -Start IV glycopyrrolate 0.2 mg every 4 hours as needed.  # Discharge Planning: Anticipated Hospital Death  Discussed with: PCCM provider, hospitalist, RN, family  Thank you for allowing the palliative care team to participate in the care Luis Brown.  Alvester Morin, DO Palliative Care Provider PMT # [phone]  If patient remains symptomatic despite maximum doses, please call PMT  at [phone] between 0700 and 1900. Outside of these hours, please call attending, as PMT does not have night coverage.

## 2023-09-16 NOTE — Hospital Course (Addendum)
Brief Narrative:  78 year old with history of acute pharyngitis, seasonal allergy, anxiety, BPH, herpes, degenerative disc disease, osteoarthritis, DM2, HTN, epidural abscess, HLD, peripheral neuropathy, testosterone deficiency recently discharged about 9 days ago after 17 days of admission for acute respiratory failure secondary to ILD and bronchiectasis exacerbation now presents back to the ER with similar complaints and findings.  Upon admission respiratory virus workup was negative.  Chest x-ray showed worsening pulmonary opacity.  Signs of possible fluid therefore received IV Lasix along with bronchodilators.  Had to be placed on BiPAP, pulmonary and palliative care team was consulted.  Patient is now comfort care.    Assessment & Plan:  Principal Problem:   Acute on chronic respiratory failure with hypoxia (HCC) Active Problems:   COPD with acute exacerbation (HCC)   Paroxysmal atrial fibrillation (HCC)   Hyponatremia   Thoracic degenerative disc disease   Essential hypertension   BPH (benign prostatic hyperplasia)   GERD (gastroesophageal reflux disease)   Spinal stenosis of lumbar region without neurogenic claudication   History of DVT (deep vein thrombosis)   Bronchiectasis with acute exacerbation (HCC)   Hyperlipidemia   Pulmonary fibrosis (HCC)   Hypochloremia   Insulin dependent type 2 diabetes mellitus (HCC)   Grade I diastolic dysfunction   Elevated troponin   Volume overload   Acute pulmonary edema (HCC)   DNR (do not resuscitate)   High risk medication use   Medication management   Counseling and coordination of care   Goals of care, counseling/discussion   Palliative care encounter   Acute respiratory failure with hypoxia requiring BiPAP COPD/ILD exacerbation  Acute congestive heart failure with preserved ejection fraction  Goals of care  Elevated troponin  Paroxysmal A-fib  Hyponatremia  Essential hypertension  BPH  GERD  Thoracic/lumbar disc  degenerative disease Peripheral neuropathy  History of DVT  Hyperlipidemia  DM 2, uncontrolled due to hyperglycemia from steroid use  DISCUSSED CARE WITH HIS FAMILY AT BEDSIDE. PATIENT IS NOW COMFORT CARE, ANTICIPATE INHOSPITAL DEATH.  START MORPHINE DRIP.       Subjective: Resting on precedex on bipap All family at bedside, wanting him to be comfort care.    Examination:  General exam: lethargic.  Respiratory system: diffuse rhonchi Cardiovascular system: S1 & S2 heard, RRR. No JVD, murmurs, rubs, gallops or clicks. No pedal edema. Gastrointestinal system: Abdomen is nondistended, soft and nontender. No organomegaly or masses felt. Normal bowel sounds heard. Central nervous system: unable to assess.  Psychiatry: unable to assess

## 2023-09-16 NOTE — Plan of Care (Signed)
  Problem: Education: Goal: Ability to describe self-care measures that may prevent or decrease complications (Diabetes Survival Skills Education) will improve Outcome: Not Progressing   Problem: Coping: Goal: Ability to adjust to condition or change in health will improve Outcome: Not Progressing   Problem: Metabolic: Goal: Ability to maintain appropriate glucose levels will improve Outcome: Not Progressing   Problem: Respiratory: Goal: Ability to maintain a clear airway will improve Outcome: Not Progressing

## 2023-09-16 NOTE — Progress Notes (Signed)
   NAME:  BLANCA THORNTON, MRN:  [id number], DOB:  [birth year], LOS: 1 ADMISSION DATE:  08/29/2023, CONSULTATION DATE:  08/29/23 REFERRING MD:  Robb Matar, CHIEF COMPLAINT:  SOB   History of Present Illness:  78 year old man with hx of myriad of lung issues dating back to 2022 (see Dr. 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  Also had a fall preceding in which he hit his sternum.  He is currently on BIPAP anxious and asking if he can have something to drink.  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   Esophageal reflux 02/10/2015   Fatigue    HTN (hypertension)    Hyperglycemia    preDM    Hyperlipidemia    Insomnia    Low back pain radiating to both legs 01/16/2017   Measles as a child   Overweight(278.02)    Peripheral neuropathy 08/07/2019   Personal history of colonic polyps 10/27/2012   Follows with [facility]   Pneumonia    " walking"   Preventative health care    Testosterone deficiency 05/23/2012   Wears glasses      Significant Hospital Events: Including procedures, antibiotic start and stop dates in addition to other pertinent events   2/11 admit  Interim History / Subjective:  Worsening respiratory status overnight, multiple PRN dilaudids given.  Objective   Blood pressure (!) 197/87, pulse (!) 129, temperature 98.3 F (36.8 C),  temperature source Axillary, resp. rate (!) 25, height 5\' 9"  (1.753 m), weight 84.6 kg, SpO2 98%.    FiO2 (%):  [40 %-100 %] 100 %   Intake/Output Summary (Last 24 hours) at 09/06/2023 9563 Last data filed at 08/31/2023 0501 Gross per 24 hour  Intake 1332.54 ml  Output 2720 ml  Net -1387.46 ml   Filed Weights   08/29/23 1748 08/22/2023 0452  Weight: 83.9 kg 84.6 kg    Examination: Ill appearing man on BIPAP Harsh rhonci bilaterally +accessory muscle use Ext with 2+ edema Wakes up with enough stimulation  Hyponatremia persistent WBC improved CXR worse despite diuresis  Resolved Hospital Problem list   N/A  Assessment & Plan:  Worsening ARDS despite abx, steroids, diuresis. Background poorly defined ILD and chronic pain  - Opiates for pain - When family here will discuss opiate gtt and transition to full comfort measures - In hospital death expected - Dr. Marchelle Gearing updated regarding patient  Myrla Halsted MD PCCM

## 2023-09-16 DEATH — deceased

## 2023-09-27 ENCOUNTER — Inpatient Hospital Stay: Payer: Medicare Other | Admitting: Primary Care
